# Patient Record
Sex: Female | Born: 1945 | Race: White | Hispanic: No | Marital: Single | Smoking: Never smoker
Health system: Southern US, Community
[De-identification: ages and names within clinical notes are randomized; demographics above are authoritative.]

## PROBLEM LIST (undated history)

## (undated) DIAGNOSIS — E860 Dehydration: Secondary | ICD-10-CM

## (undated) DIAGNOSIS — E119 Type 2 diabetes mellitus without complications: Secondary | ICD-10-CM

## (undated) DIAGNOSIS — R531 Weakness: Secondary | ICD-10-CM

## (undated) DIAGNOSIS — L270 Generalized skin eruption due to drugs and medicaments taken internally: Secondary | ICD-10-CM

## (undated) DIAGNOSIS — F319 Bipolar disorder, unspecified: Secondary | ICD-10-CM

## (undated) DIAGNOSIS — I469 Cardiac arrest, cause unspecified: Secondary | ICD-10-CM

## (undated) DIAGNOSIS — R0902 Hypoxemia: Secondary | ICD-10-CM

## (undated) DIAGNOSIS — J9621 Acute and chronic respiratory failure with hypoxia: Secondary | ICD-10-CM

## (undated) DIAGNOSIS — I10 Essential (primary) hypertension: Secondary | ICD-10-CM

## (undated) DIAGNOSIS — I4891 Unspecified atrial fibrillation: Secondary | ICD-10-CM

## (undated) HISTORY — DX: Acute and chronic respiratory failure with hypoxia: J96.21

## (undated) HISTORY — DX: Generalized skin eruption due to drugs and medicaments taken internally: L27.0

## (undated) HISTORY — DX: Hypoxemia: R09.02

## (undated) HISTORY — DX: Cardiac arrest, cause unspecified: I46.9

---
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
## 2018-02-27 DIAGNOSIS — E669 Obesity, unspecified: Secondary | ICD-10-CM | POA: Insufficient documentation

## 2018-02-27 DIAGNOSIS — Z6841 Body Mass Index (BMI) 40.0 and over, adult: Secondary | ICD-10-CM | POA: Insufficient documentation

## 2019-08-02 DIAGNOSIS — A0472 Enterocolitis due to Clostridium difficile, not specified as recurrent: Secondary | ICD-10-CM

## 2019-08-02 HISTORY — DX: Enterocolitis due to Clostridium difficile, not specified as recurrent: A04.72

## 2019-09-07 ENCOUNTER — Emergency Department: Payer: Medicare Other

## 2019-09-07 ENCOUNTER — Telehealth: Payer: Self-pay

## 2019-09-07 ENCOUNTER — Emergency Department
Admission: EM | Admit: 2019-09-07 | Discharge: 2019-09-07 | Disposition: A | Discharge status: Other Acute Inpatient Hospital | Payer: Medicare Other | Attending: Emergency Medicine | Admitting: Emergency Medicine

## 2019-09-07 ENCOUNTER — Inpatient Hospital Stay (HOSPITAL_COMMUNITY)
Admission: AD | Admit: 2019-09-07 | Discharge: 2019-09-23 | DRG: 870 | Disposition: A | Discharge status: Skilled Nursing Facility | Payer: Medicare Other | Source: Other Acute Inpatient Hospital | Attending: Internal Medicine | Admitting: Internal Medicine

## 2019-09-07 ENCOUNTER — Inpatient Hospital Stay (HOSPITAL_COMMUNITY): Payer: Medicare Other

## 2019-09-07 ENCOUNTER — Encounter: Payer: Self-pay | Admitting: Emergency Medicine

## 2019-09-07 DIAGNOSIS — J9691 Respiratory failure, unspecified with hypoxia: Secondary | ICD-10-CM

## 2019-09-07 DIAGNOSIS — A419 Sepsis, unspecified organism: Secondary | ICD-10-CM | POA: Diagnosis not present

## 2019-09-07 DIAGNOSIS — N390 Urinary tract infection, site not specified: Secondary | ICD-10-CM | POA: Diagnosis present

## 2019-09-07 DIAGNOSIS — R4182 Altered mental status, unspecified: Secondary | ICD-10-CM | POA: Insufficient documentation

## 2019-09-07 DIAGNOSIS — F319 Bipolar disorder, unspecified: Secondary | ICD-10-CM | POA: Diagnosis present

## 2019-09-07 DIAGNOSIS — N179 Acute kidney failure, unspecified: Secondary | ICD-10-CM | POA: Diagnosis not present

## 2019-09-07 DIAGNOSIS — D649 Anemia, unspecified: Secondary | ICD-10-CM | POA: Diagnosis present

## 2019-09-07 DIAGNOSIS — A4189 Other specified sepsis: Principal | ICD-10-CM | POA: Diagnosis present

## 2019-09-07 DIAGNOSIS — L89300 Pressure ulcer of unspecified buttock, unstageable: Secondary | ICD-10-CM | POA: Diagnosis not present

## 2019-09-07 DIAGNOSIS — L89150 Pressure ulcer of sacral region, unstageable: Secondary | ICD-10-CM | POA: Diagnosis present

## 2019-09-07 DIAGNOSIS — A499 Bacterial infection, unspecified: Secondary | ICD-10-CM

## 2019-09-07 DIAGNOSIS — L8990 Pressure ulcer of unspecified site, unspecified stage: Secondary | ICD-10-CM | POA: Insufficient documentation

## 2019-09-07 DIAGNOSIS — A4159 Other Gram-negative sepsis: Secondary | ICD-10-CM | POA: Diagnosis present

## 2019-09-07 DIAGNOSIS — L89892 Pressure ulcer of other site, stage 2: Secondary | ICD-10-CM | POA: Diagnosis present

## 2019-09-07 DIAGNOSIS — L89893 Pressure ulcer of other site, stage 3: Secondary | ICD-10-CM | POA: Diagnosis not present

## 2019-09-07 DIAGNOSIS — U071 COVID-19: Secondary | ICD-10-CM | POA: Insufficient documentation

## 2019-09-07 DIAGNOSIS — E119 Type 2 diabetes mellitus without complications: Secondary | ICD-10-CM | POA: Diagnosis not present

## 2019-09-07 DIAGNOSIS — E872 Acidosis: Secondary | ICD-10-CM | POA: Diagnosis not present

## 2019-09-07 DIAGNOSIS — Z79899 Other long term (current) drug therapy: Secondary | ICD-10-CM | POA: Diagnosis not present

## 2019-09-07 DIAGNOSIS — J9601 Acute respiratory failure with hypoxia: Secondary | ICD-10-CM

## 2019-09-07 DIAGNOSIS — E1165 Type 2 diabetes mellitus with hyperglycemia: Secondary | ICD-10-CM | POA: Diagnosis present

## 2019-09-07 DIAGNOSIS — J80 Acute respiratory distress syndrome: Secondary | ICD-10-CM | POA: Diagnosis not present

## 2019-09-07 DIAGNOSIS — E1151 Type 2 diabetes mellitus with diabetic peripheral angiopathy without gangrene: Secondary | ICD-10-CM | POA: Diagnosis not present

## 2019-09-07 DIAGNOSIS — J1520 Pneumonia due to staphylococcus, unspecified: Secondary | ICD-10-CM | POA: Diagnosis not present

## 2019-09-07 DIAGNOSIS — Z7901 Long term (current) use of anticoagulants: Secondary | ICD-10-CM

## 2019-09-07 DIAGNOSIS — G9341 Metabolic encephalopathy: Secondary | ICD-10-CM | POA: Diagnosis not present

## 2019-09-07 DIAGNOSIS — I10 Essential (primary) hypertension: Secondary | ICD-10-CM | POA: Insufficient documentation

## 2019-09-07 DIAGNOSIS — Z7984 Long term (current) use of oral hypoglycemic drugs: Secondary | ICD-10-CM | POA: Diagnosis not present

## 2019-09-07 DIAGNOSIS — R6520 Severe sepsis without septic shock: Secondary | ICD-10-CM | POA: Diagnosis not present

## 2019-09-07 DIAGNOSIS — T380X5A Adverse effect of glucocorticoids and synthetic analogues, initial encounter: Secondary | ICD-10-CM | POA: Diagnosis present

## 2019-09-07 DIAGNOSIS — J1282 Pneumonia due to coronavirus disease 2019: Secondary | ICD-10-CM | POA: Diagnosis not present

## 2019-09-07 DIAGNOSIS — Z9289 Personal history of other medical treatment: Secondary | ICD-10-CM | POA: Diagnosis not present

## 2019-09-07 DIAGNOSIS — K219 Gastro-esophageal reflux disease without esophagitis: Secondary | ICD-10-CM | POA: Diagnosis present

## 2019-09-07 DIAGNOSIS — I482 Chronic atrial fibrillation, unspecified: Secondary | ICD-10-CM | POA: Diagnosis not present

## 2019-09-07 DIAGNOSIS — R0600 Dyspnea, unspecified: Secondary | ICD-10-CM

## 2019-09-07 DIAGNOSIS — J9600 Acute respiratory failure, unspecified whether with hypoxia or hypercapnia: Secondary | ICD-10-CM

## 2019-09-07 HISTORY — DX: Dehydration: E86.0

## 2019-09-07 HISTORY — DX: Unspecified atrial fibrillation: I48.91

## 2019-09-07 HISTORY — DX: Essential (primary) hypertension: I10

## 2019-09-07 HISTORY — DX: Morbid (severe) obesity due to excess calories: E66.01

## 2019-09-07 HISTORY — DX: Weakness: R53.1

## 2019-09-07 HISTORY — DX: Type 2 diabetes mellitus without complications: E11.9

## 2019-09-07 LAB — CBC WITH DIFFERENTIAL/PLATELET
Abs Immature Granulocytes: 0.07 10*3/uL (ref 0.00–0.07)
Abs Immature Granulocytes: 0.09 K/uL — ABNORMAL HIGH (ref 0.00–0.07)
Basophils Absolute: 0 10*3/uL (ref 0.0–0.1)
Basophils Absolute: 0 K/uL (ref 0.0–0.1)
Basophils Relative: 0 %
Basophils Relative: 0 %
Eosinophils Absolute: 0.2 10*3/uL (ref 0.0–0.5)
Eosinophils Absolute: 0 K/uL (ref 0.0–0.5)
Eosinophils Relative: 3 %
Eosinophils Relative: 0 %
HCT: 45.3 % (ref 36.0–46.0)
HCT: 38.8 % (ref 36.0–46.0)
Hemoglobin: 13.8 g/dL (ref 12.0–15.0)
Hemoglobin: 12 g/dL (ref 12.0–15.0)
Immature Granulocytes: 1 %
Immature Granulocytes: 1 %
Lymphocytes Relative: 25 %
Lymphocytes Relative: 20 %
Lymphs Abs: 1.9 10*3/uL (ref 0.7–4.0)
Lymphs Abs: 1.6 K/uL (ref 0.7–4.0)
MCH: 30.1 pg (ref 26.0–34.0)
MCH: 30.7 pg (ref 26.0–34.0)
MCHC: 30.5 g/dL (ref 30.0–36.0)
MCHC: 30.9 g/dL (ref 30.0–36.0)
MCV: 98.9 fL (ref 80.0–100.0)
MCV: 99.2 fL (ref 80.0–100.0)
Monocytes Absolute: 1.1 10*3/uL — ABNORMAL HIGH (ref 0.1–1.0)
Monocytes Absolute: 0.6 K/uL (ref 0.1–1.0)
Monocytes Relative: 14 %
Monocytes Relative: 8 %
Neutro Abs: 4.5 10*3/uL (ref 1.7–7.7)
Neutro Abs: 5.5 K/uL (ref 1.7–7.7)
Neutrophils Relative %: 57 %
Neutrophils Relative %: 71 %
Platelets: 223 10*3/uL (ref 150–400)
Platelets: 176 K/uL (ref 150–400)
RBC: 4.58 MIL/uL (ref 3.87–5.11)
RBC: 3.91 MIL/uL (ref 3.87–5.11)
RDW: 14.5 % (ref 11.5–15.5)
RDW: 14.6 % (ref 11.5–15.5)
WBC: 7.7 10*3/uL (ref 4.0–10.5)
WBC: 7.7 K/uL (ref 4.0–10.5)
nRBC: 0 % (ref 0.0–0.2)
nRBC: 0 % (ref 0.0–0.2)

## 2019-09-07 LAB — POCT I-STAT 7, (LYTES, BLD GAS, ICA,H+H)
Acid-Base Excess: 9 mmol/L — ABNORMAL HIGH (ref 0.0–2.0)
Acid-Base Excess: 7 mmol/L — ABNORMAL HIGH (ref 0.0–2.0)
Bicarbonate: 32.8 mmol/L — ABNORMAL HIGH (ref 20.0–28.0)
Bicarbonate: 30.4 mmol/L — ABNORMAL HIGH (ref 20.0–28.0)
Calcium, Ion: 1.12 mmol/L — ABNORMAL LOW (ref 1.15–1.40)
Calcium, Ion: 1.14 mmol/L — ABNORMAL LOW (ref 1.15–1.40)
HCT: 36 % (ref 36.0–46.0)
HCT: 33 % — ABNORMAL LOW (ref 36.0–46.0)
Hemoglobin: 12.2 g/dL (ref 12.0–15.0)
Hemoglobin: 11.2 g/dL — ABNORMAL LOW (ref 12.0–15.0)
O2 Saturation: 97 %
O2 Saturation: 97 %
Patient temperature: 98.6
Potassium: 4.9 mmol/L (ref 3.5–5.1)
Potassium: 4.3 mmol/L (ref 3.5–5.1)
Sodium: 142 mmol/L (ref 135–145)
Sodium: 143 mmol/L (ref 135–145)
TCO2: 34 mmol/L — ABNORMAL HIGH (ref 22–32)
TCO2: 32 mmol/L (ref 22–32)
pCO2 arterial: 40.6 mmHg (ref 32.0–48.0)
pCO2 arterial: 37.7 mmHg (ref 32.0–48.0)
pH, Arterial: 7.515 — ABNORMAL HIGH (ref 7.350–7.450)
pH, Arterial: 7.515 — ABNORMAL HIGH (ref 7.350–7.450)
pO2, Arterial: 86 mmHg (ref 83.0–108.0)
pO2, Arterial: 77 mmHg — ABNORMAL LOW (ref 83.0–108.0)

## 2019-09-07 LAB — BLOOD GAS, ARTERIAL
Acid-Base Excess: 11.6 mmol/L — ABNORMAL HIGH (ref 0.0–2.0)
Bicarbonate: 35 mmol/L — ABNORMAL HIGH (ref 20.0–28.0)
FIO2: 0.6
MECHVT: 450 mL
O2 Saturation: 96.5 %
PEEP: 5 cmH2O
Patient temperature: 37
RATE: 16 resp/min
pCO2 arterial: 40 mmHg (ref 32.0–48.0)
pH, Arterial: 7.55 — ABNORMAL HIGH (ref 7.350–7.450)
pO2, Arterial: 74 mmHg — ABNORMAL LOW (ref 83.0–108.0)

## 2019-09-07 LAB — COMPREHENSIVE METABOLIC PANEL
ALT: 34 U/L (ref 0–44)
ALT: 29 U/L (ref 0–44)
AST: 39 U/L (ref 15–41)
AST: 40 U/L (ref 15–41)
Albumin: 2.6 g/dL — ABNORMAL LOW (ref 3.5–5.0)
Albumin: 2.2 g/dL — ABNORMAL LOW (ref 3.5–5.0)
Alkaline Phosphatase: 38 U/L (ref 38–126)
Alkaline Phosphatase: 31 U/L — ABNORMAL LOW (ref 38–126)
Anion gap: 11 (ref 5–15)
Anion gap: 15 (ref 5–15)
BUN: 58 mg/dL — ABNORMAL HIGH (ref 8–23)
BUN: 68 mg/dL — ABNORMAL HIGH (ref 8–23)
CO2: 35 mmol/L — ABNORMAL HIGH (ref 22–32)
CO2: 27 mmol/L (ref 22–32)
Calcium: 9 mg/dL (ref 8.9–10.3)
Calcium: 8.4 mg/dL — ABNORMAL LOW (ref 8.9–10.3)
Chloride: 99 mmol/L (ref 98–111)
Chloride: 101 mmol/L (ref 98–111)
Creatinine, Ser: 1.76 mg/dL — ABNORMAL HIGH (ref 0.44–1.00)
Creatinine, Ser: 1.87 mg/dL — ABNORMAL HIGH (ref 0.44–1.00)
GFR calc Af Amer: 33 mL/min — ABNORMAL LOW (ref >60)
GFR calc Af Amer: 31 mL/min — ABNORMAL LOW (ref >60)
GFR calc non Af Amer: 29 mL/min — ABNORMAL LOW (ref >60)
GFR calc non Af Amer: 27 mL/min — ABNORMAL LOW (ref >60)
Glucose, Bld: 306 mg/dL — ABNORMAL HIGH (ref 70–99)
Glucose, Bld: 395 mg/dL — ABNORMAL HIGH (ref 70–99)
Potassium: 5 mmol/L (ref 3.5–5.1)
Potassium: 4.8 mmol/L (ref 3.5–5.1)
Sodium: 145 mmol/L (ref 135–145)
Sodium: 143 mmol/L (ref 135–145)
Total Bilirubin: 0.8 mg/dL (ref 0.3–1.2)
Total Bilirubin: 1.1 mg/dL (ref 0.3–1.2)
Total Protein: 7.3 g/dL (ref 6.5–8.1)
Total Protein: 6 g/dL — ABNORMAL LOW (ref 6.5–8.1)

## 2019-09-07 LAB — RESPIRATORY PANEL BY RT PCR (FLU A&B, COVID)
Influenza A by PCR: NEGATIVE
Influenza B by PCR: NEGATIVE
SARS Coronavirus 2 by RT PCR: POSITIVE — AB

## 2019-09-07 LAB — FIBRIN DERIVATIVES D-DIMER (ARMC ONLY): Fibrin derivatives D-dimer (ARMC): 878.79 ng/mL (FEU) — ABNORMAL HIGH (ref 0.00–499.00)

## 2019-09-07 LAB — URINALYSIS, ROUTINE W REFLEX MICROSCOPIC
Bilirubin Urine: NEGATIVE
Glucose, UA: 50 mg/dL — AB
Ketones, ur: 5 mg/dL — AB
Leukocytes,Ua: NEGATIVE
Nitrite: NEGATIVE
Protein, ur: 100 mg/dL — AB
Specific Gravity, Urine: 1.024 (ref 1.005–1.030)
pH: 5 (ref 5.0–8.0)

## 2019-09-07 LAB — PHOSPHORUS: Phosphorus: 2.5 mg/dL (ref 2.5–4.6)

## 2019-09-07 LAB — BLOOD GAS, VENOUS
Acid-Base Excess: 11.4 mmol/L — ABNORMAL HIGH (ref 0.0–2.0)
Bicarbonate: 38.3 mmol/L — ABNORMAL HIGH (ref 20.0–28.0)
O2 Saturation: 92.2 %
Patient temperature: 37
pCO2, Ven: 59 mmHg (ref 44.0–60.0)
pH, Ven: 7.42 (ref 7.250–7.430)
pO2, Ven: 63 mmHg — ABNORMAL HIGH (ref 32.0–45.0)

## 2019-09-07 LAB — GLUCOSE, CAPILLARY
Glucose-Capillary: 375 mg/dL — ABNORMAL HIGH (ref 70–99)
Glucose-Capillary: 415 mg/dL — ABNORMAL HIGH (ref 70–99)
Glucose-Capillary: 400 mg/dL — ABNORMAL HIGH (ref 70–99)
Glucose-Capillary: 351 mg/dL — ABNORMAL HIGH (ref 70–99)

## 2019-09-07 LAB — PROCALCITONIN
Procalcitonin: 0.16 ng/mL
Procalcitonin: 0.16 ng/mL

## 2019-09-07 LAB — LACTIC ACID, PLASMA
Lactic Acid, Venous: 1.6 mmol/L (ref 0.5–1.9)
Lactic Acid, Venous: 1.8 mmol/L (ref 0.5–1.9)
Lactic Acid, Venous: 2 mmol/L (ref 0.5–1.9)

## 2019-09-07 LAB — TRIGLYCERIDES: Triglycerides: 300 mg/dL — ABNORMAL HIGH (ref <150)

## 2019-09-07 LAB — C-REACTIVE PROTEIN: CRP: 9.1 mg/dL — ABNORMAL HIGH (ref <1.0)

## 2019-09-07 LAB — PROTIME-INR
INR: 1.4 — ABNORMAL HIGH (ref 0.8–1.2)
INR: 1.6 — ABNORMAL HIGH (ref 0.8–1.2)
Prothrombin Time: 17.3 seconds — ABNORMAL HIGH (ref 11.4–15.2)
Prothrombin Time: 18.5 s — ABNORMAL HIGH (ref 11.4–15.2)

## 2019-09-07 LAB — APTT: aPTT: 33 s (ref 24–36)

## 2019-09-07 LAB — MAGNESIUM: Magnesium: 2.2 mg/dL (ref 1.7–2.4)

## 2019-09-07 LAB — BRAIN NATRIURETIC PEPTIDE: B Natriuretic Peptide: 37.6 pg/mL (ref 0.0–100.0)

## 2019-09-07 LAB — VALPROIC ACID LEVEL: Valproic Acid Lvl: 19 ug/mL — ABNORMAL LOW (ref 50.0–100.0)

## 2019-09-07 LAB — D-DIMER, QUANTITATIVE: D-Dimer, Quant: 0.59 ug{FEU}/mL — ABNORMAL HIGH (ref 0.00–0.50)

## 2019-09-07 IMAGING — DX DG CHEST 1V PORT
1 series · 1 of 1 positions shown · non-contrast
Comparison: Earlier radiographs today.

CLINICAL DATA: Post intubation.

EXAM:
PORTABLE CHEST 1 VIEW

[chest ap]
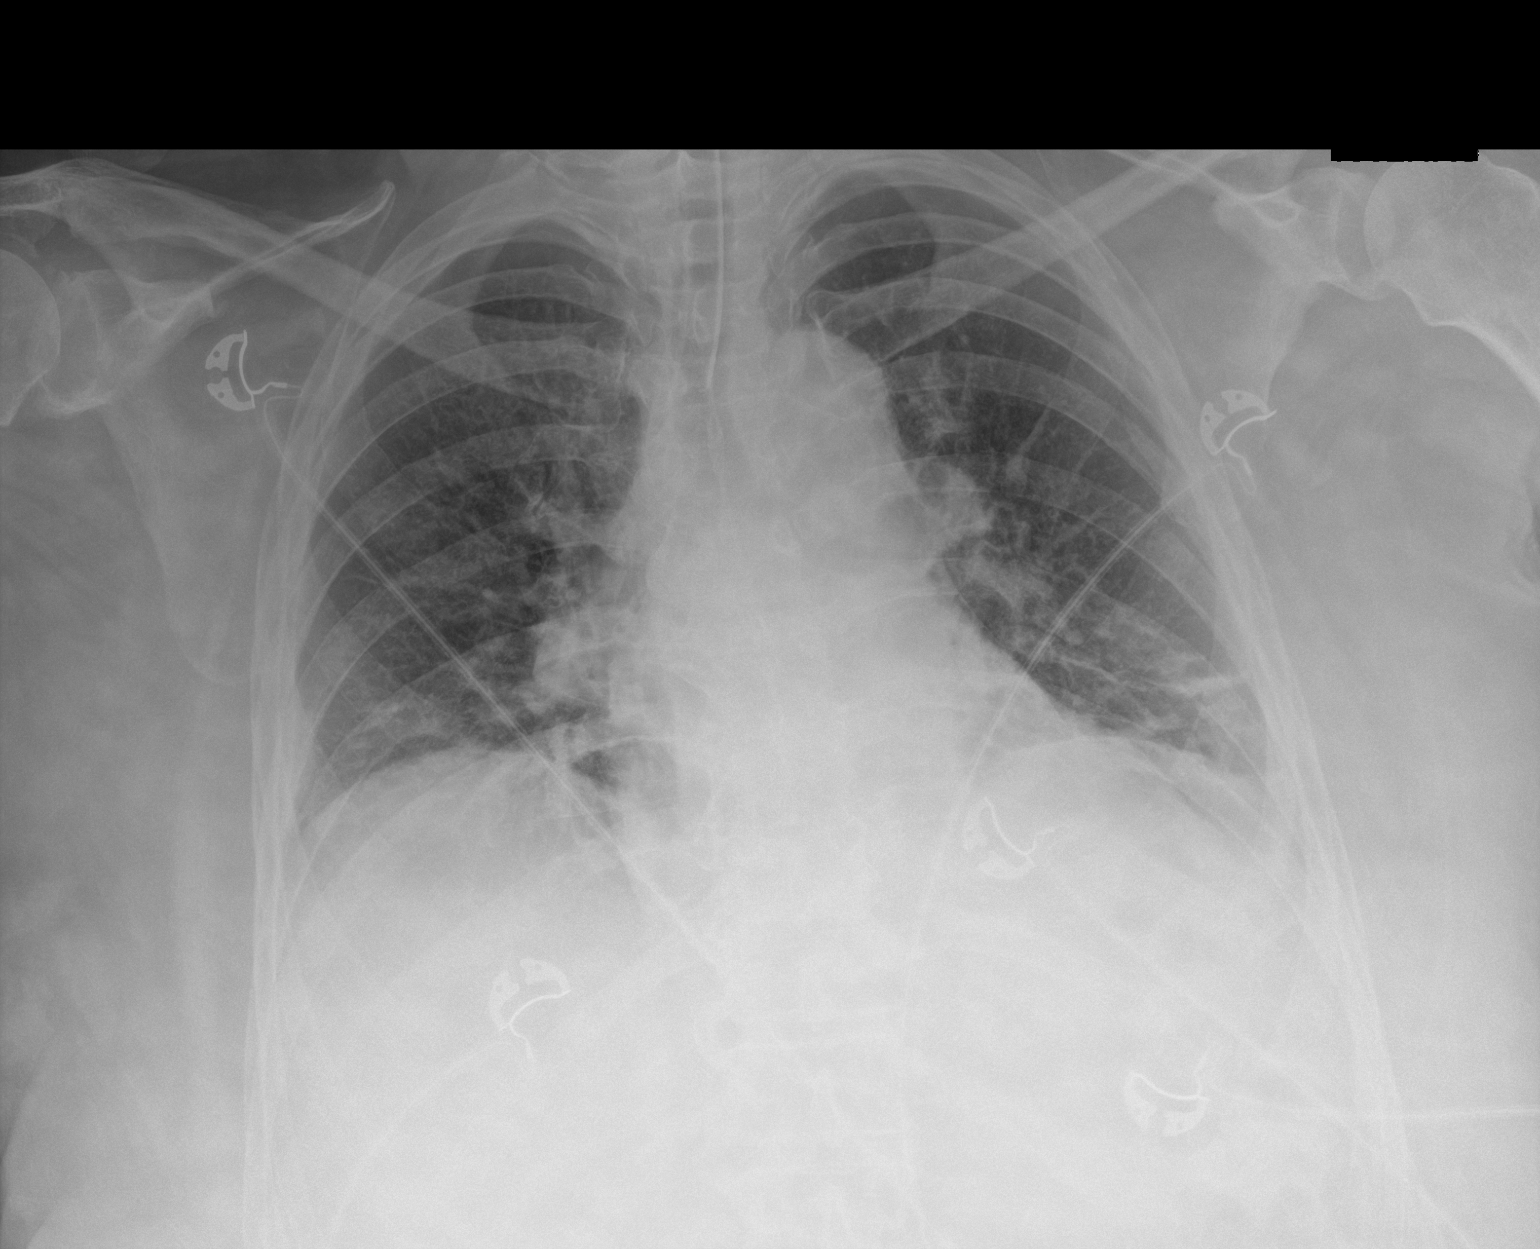

[1 of 1 positions shown; findings below may reference images not displayed]

FINDINGS: [78] hours. Interval intubation. Tip of the endotracheal tube is in
the mid trachea, 3.2 cm above the carina. There are lower lung
volumes with increased patchy opacities at both lung bases which may
reflect atelectasis. There is no confluent airspace opacity, edema,
pleural effusion or pneumothorax. The heart size and mediastinal
contours are stable.
IMPRESSION: Satisfactory position of the endotracheal tube. Lower lung volumes
with probable increased bibasilar atelectasis. Cannot exclude early
aspiration.

## 2019-09-07 IMAGING — DX DG CHEST 1V PORT
1 series · 1 of 1 positions shown · non-contrast
Comparison: None.

CLINICAL DATA: Sepsis.  [DZ] positive.

EXAM:
PORTABLE CHEST 1 VIEW

[chest ap]
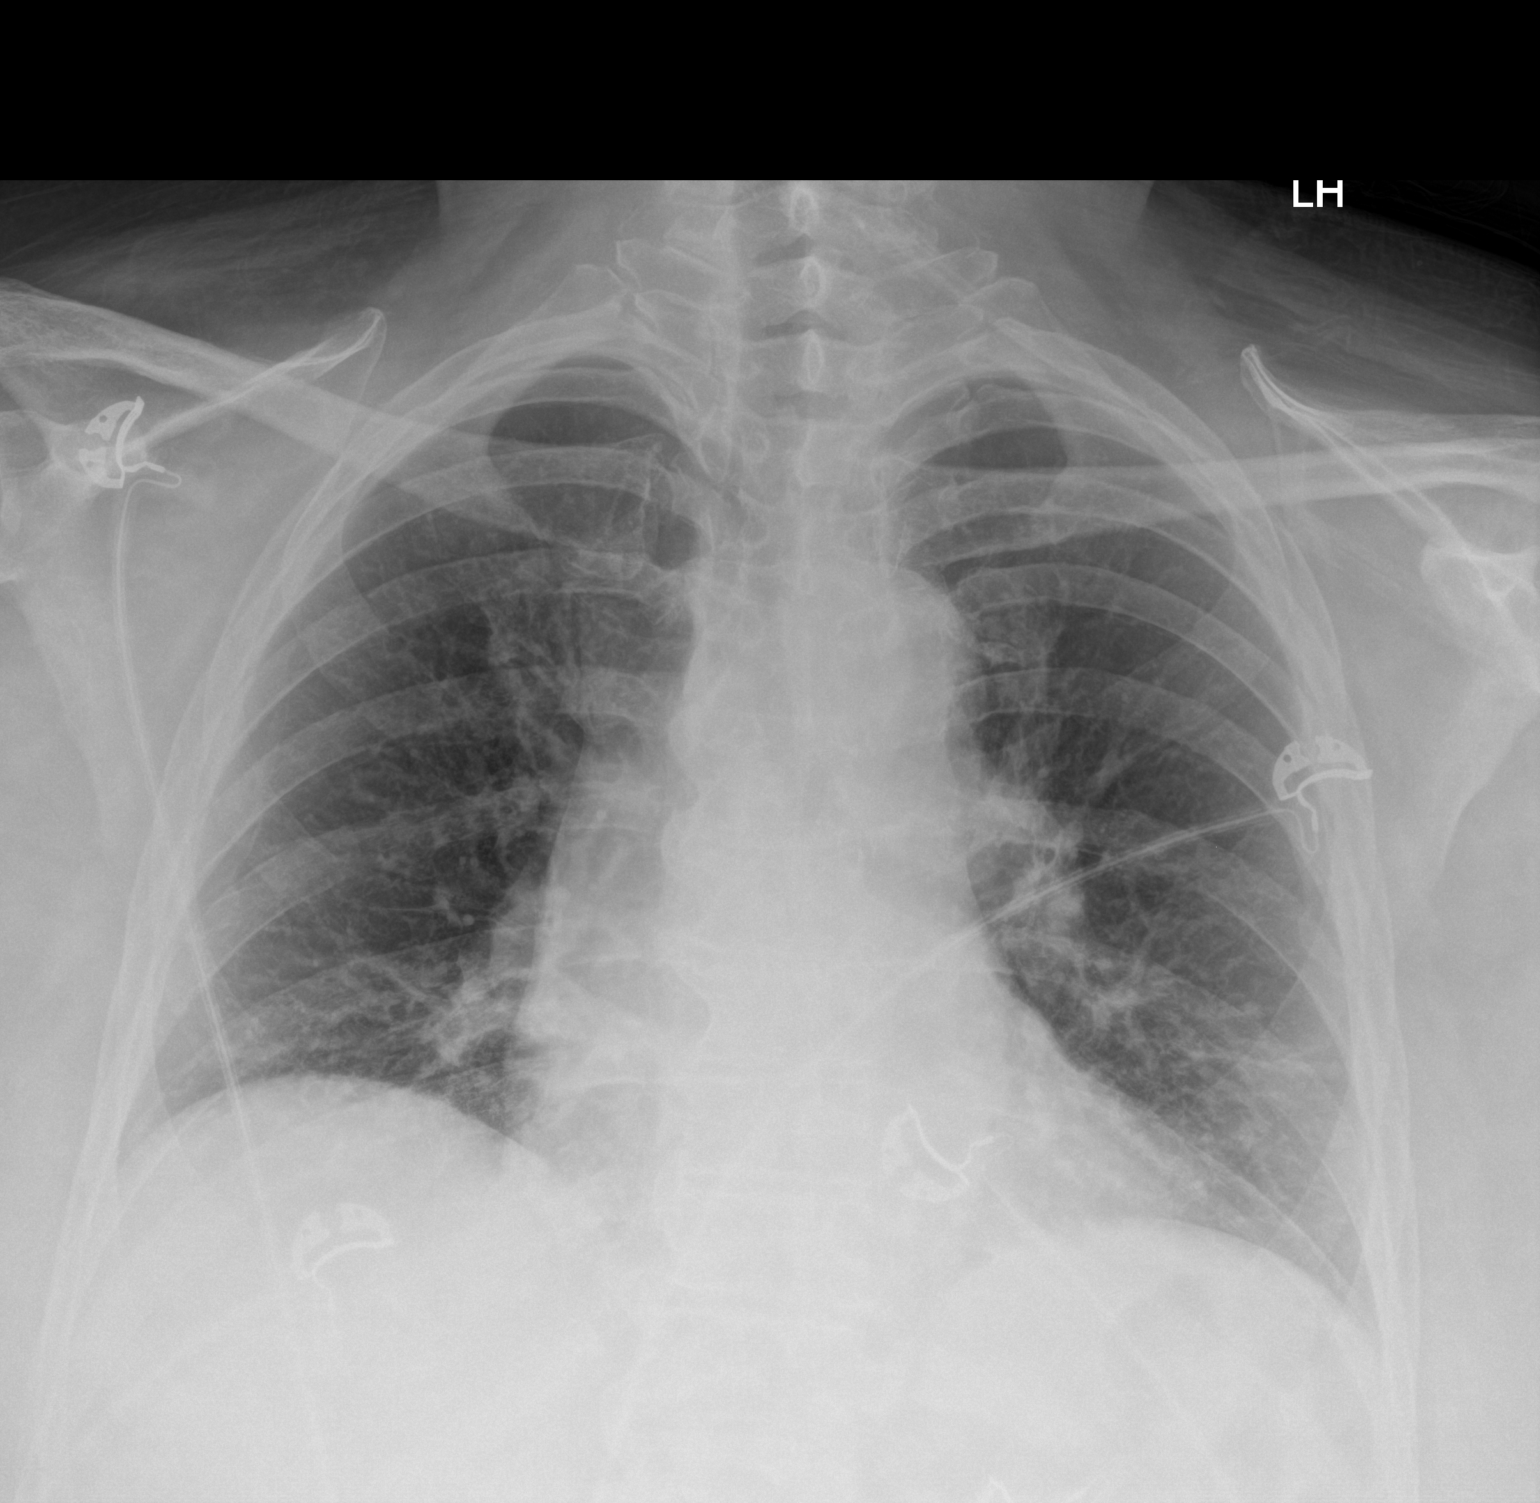

[1 of 1 positions shown; findings below may reference images not displayed]

FINDINGS: Midline trachea. Borderline cardiomegaly. No pleural effusion or
pneumothorax. Interstitial prominence is lower lobe predominant,
accentuated by low lung volumes and AP portable
technique-nonspecific. No lobar consolidation.
IMPRESSION: Mildly low lung volumes, without acute disease.

## 2019-09-07 IMAGING — CT CT HEAD W/O CM
4 series · 16 of 47 positions shown, 18 images · non-contrast
Comparison: None.

CLINICAL DATA: Altered mental status with tachypnea and fever.
[LD] positive

EXAM:
CT HEAD WITHOUT CONTRAST
TECHNIQUE: Contiguous axial images were obtained from the base of the skull
through the vertex without intravenous contrast.

[Series 2: head wo · axial · 0.39mm/px · z∈[-126,-16]mm · 7 of 30 slices shown, 9 images]
[im 4/30  brain]
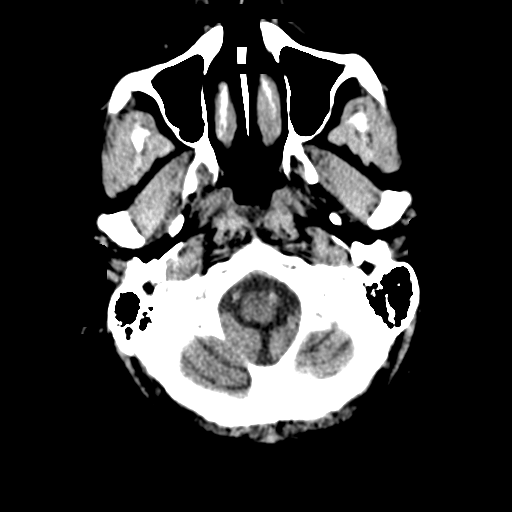
[im 4/30  bone]
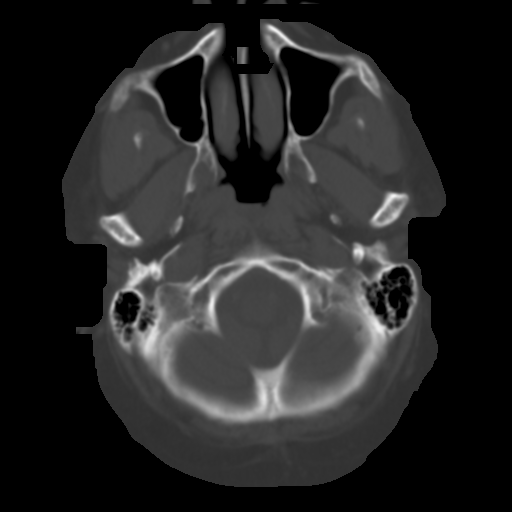
[im 8/30  brain]
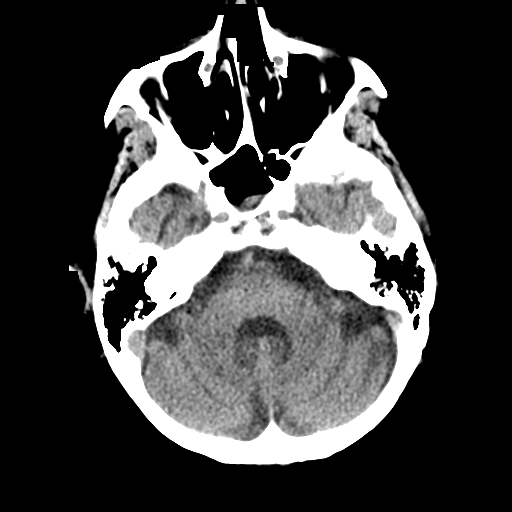
[im 11/30  brain]
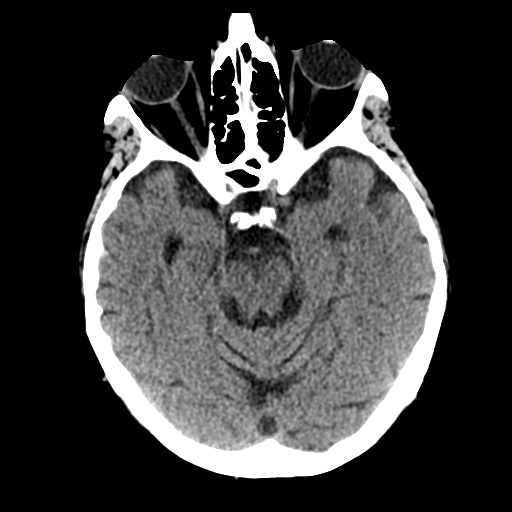
[im 15/30  brain]
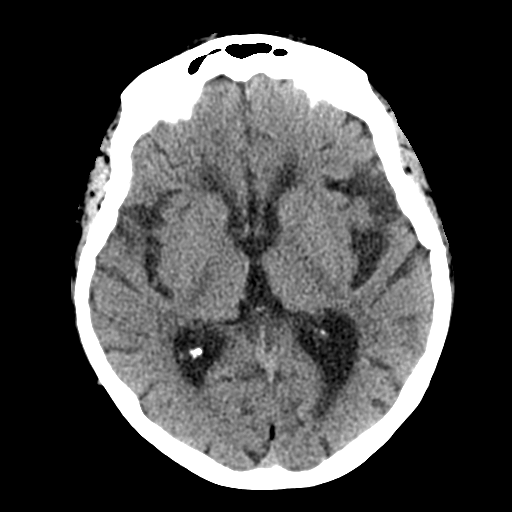
[im 19/30  brain]
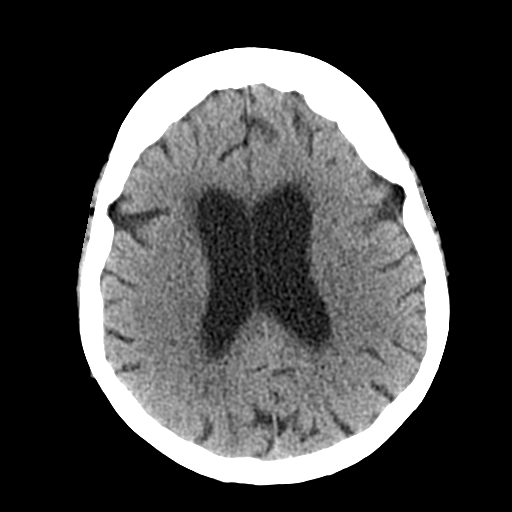
[im 19/30  bone]
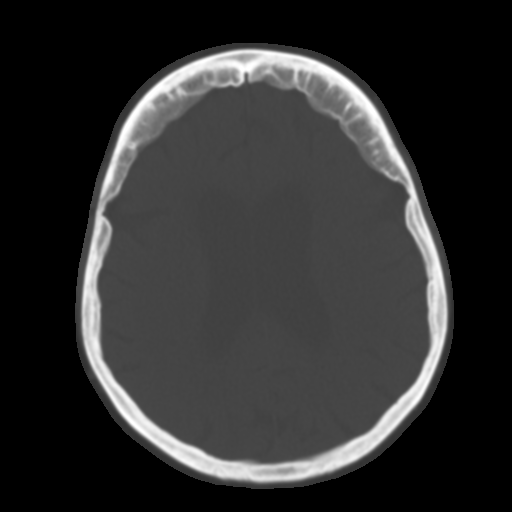
[im 22/30  brain]
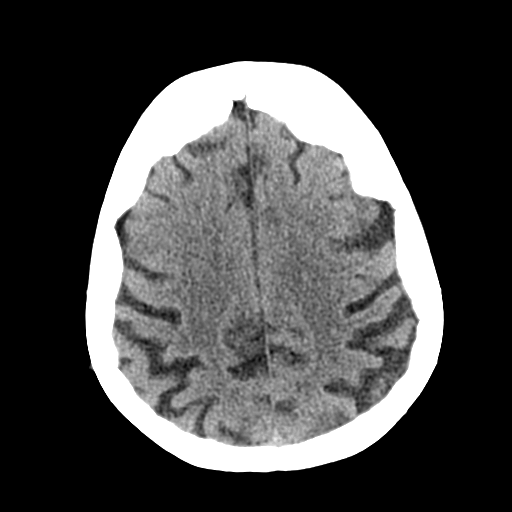
[im 26/30  brain]
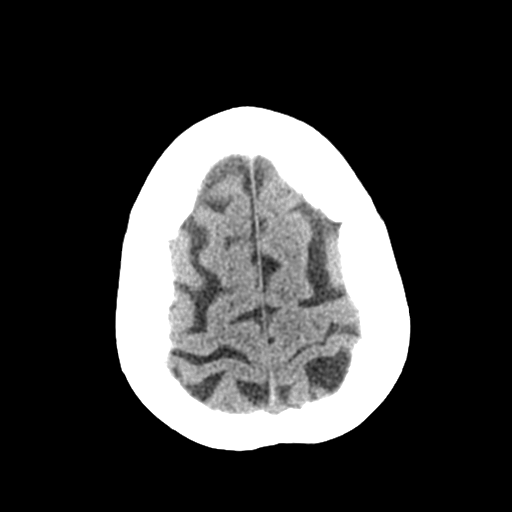

[Series 3: head bone · axial · 0.39mm/px · z∈[-127,-97]mm · 3 of 75 slices shown]
[im 8/75  bone]
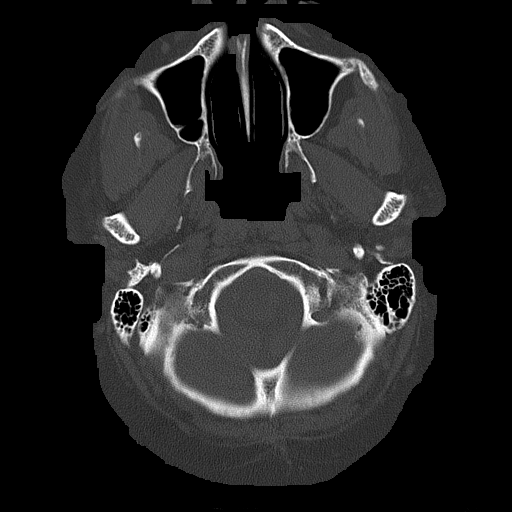
[im 15/75  bone]
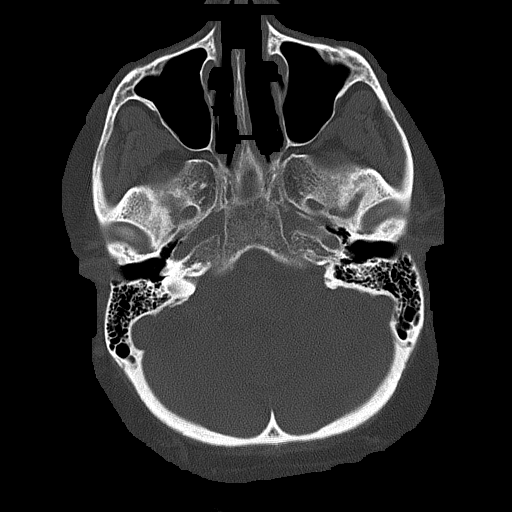
[im 23/75  bone]
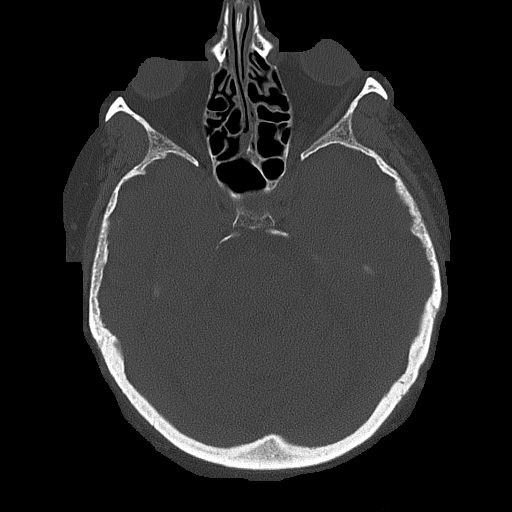

[Series 4: coronal soft tissue · coronal · 0.31mm/px · 3 of 66 slices shown]
[im 22/66  brain]
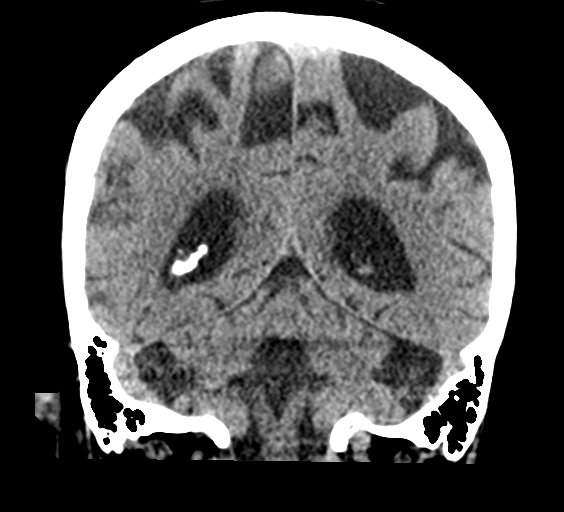
[im 29/66  brain]
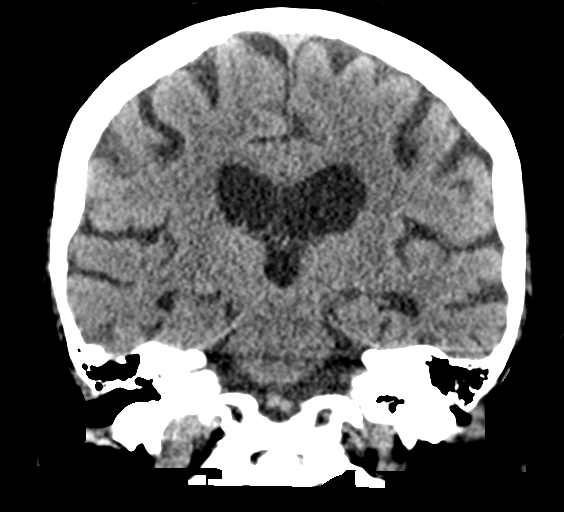
[im 37/66  brain]
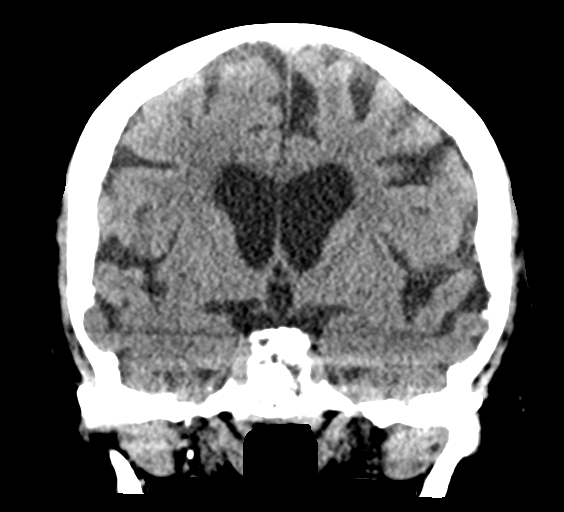

[Series 5: sagittal soft tissue · sagittal · 0.31mm/px · 3 of 59 slices shown]
[im 20/59  brain]
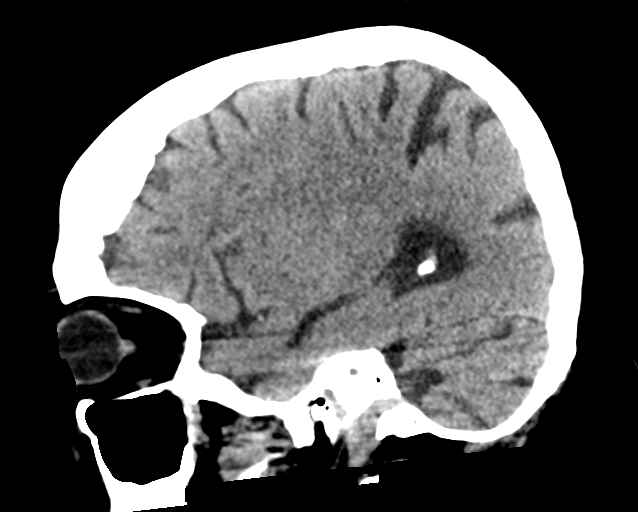
[im 30/59  brain]
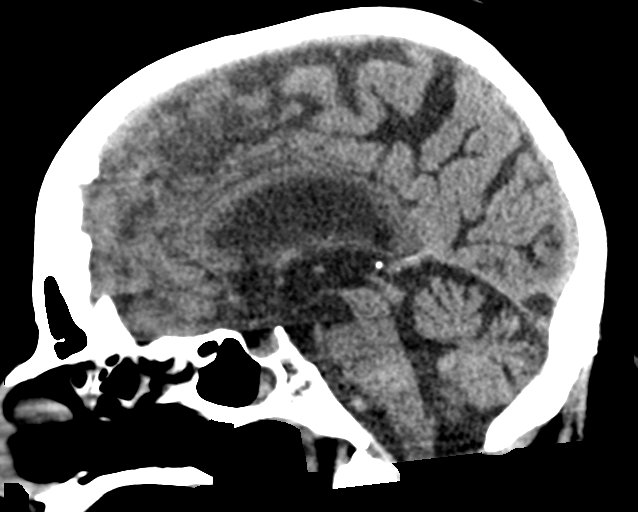
[im 39/59  brain]
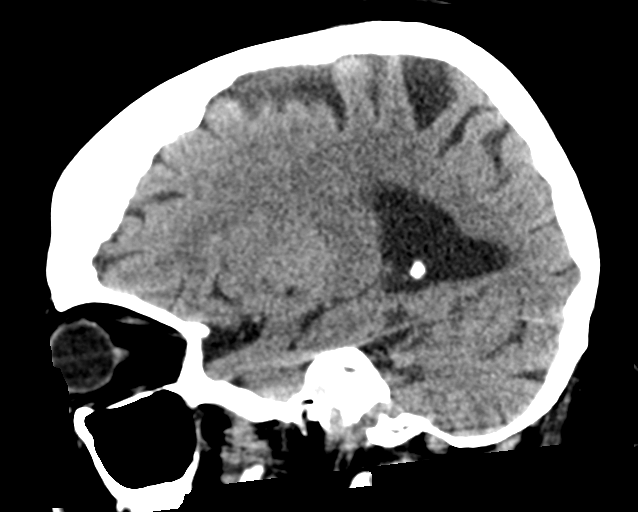

[16 of 47 positions shown; findings below may reference images not displayed]

FINDINGS: Brain: There is mild diffuse atrophy. There is no intracranial mass,
hemorrhage, extra-axial fluid collection, or midline shift. There is
mild small vessel disease in the centra semiovale bilaterally. No
acute infarct is evident on this study.

Vascular: There is no hyperdense vessel. No appreciable vascular
calcification.

Skull: The bony calvarium appears intact.

Sinuses/Orbits: There is mucosal thickening and opacification in the
posterior sphenoid sinus region. There is mucosal thickening
involving multiple ethmoid air cells. Orbits appear symmetric
bilaterally.

Other: Mastoid air cells are clear.
IMPRESSION: Atrophy with mild periventricular small vessel disease. No evident
acute infarct. No mass or hemorrhage.

There are foci of paranasal sinus disease at several sites.

## 2019-09-07 IMAGING — DX DG CHEST 1V
1 series · 1 of 1 positions shown · non-contrast
Comparison: Portable chest [KQ] hours today.

CLINICAL DATA: 73-year-old female [KQ]. Endotracheal tube and
enteric tube placement.

EXAM:
CHEST  1 VIEW

[chest ap]
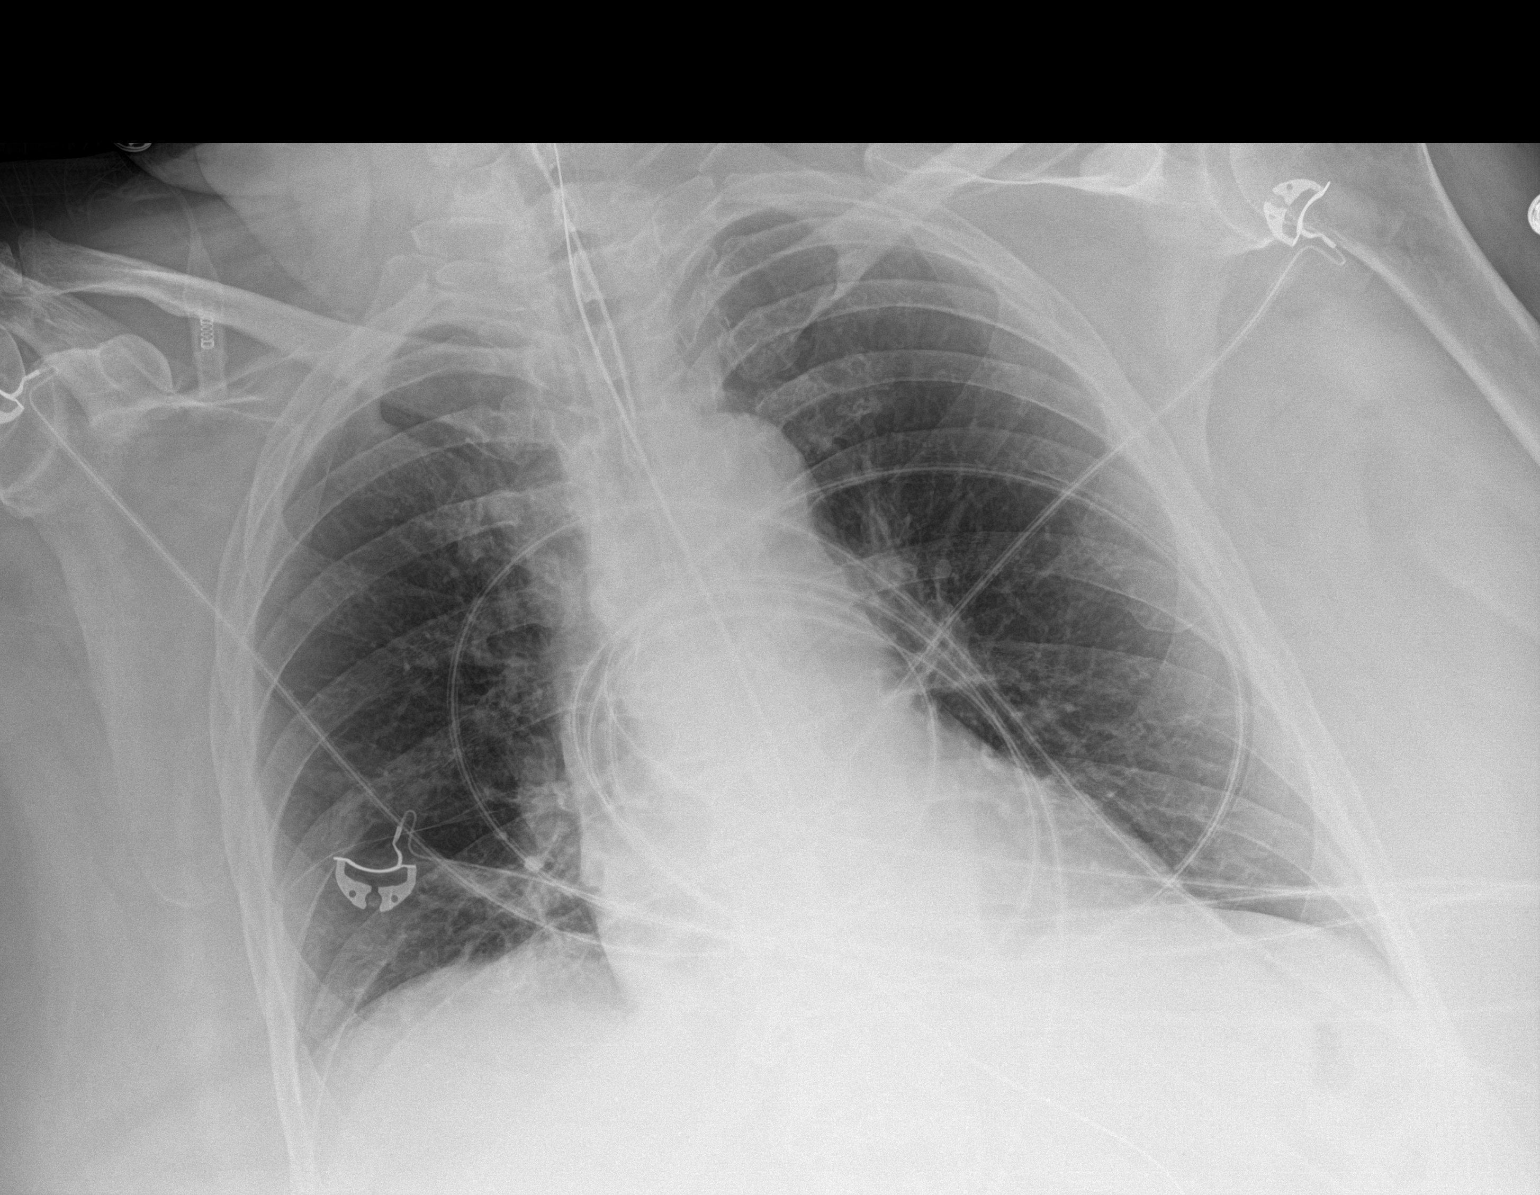

[1 of 1 positions shown; findings below may reference images not displayed]

FINDINGS: Portable AP semi upright view at [KQ] hours. Endotracheal tube tip
in good position between the level the clavicles and carina. Enteric
tube courses to the left abdomen, tip not included.

Larger lung volumes. Mediastinal contours remain normal. Allowing
for portable technique the lungs are clear. No pneumothorax or
pleural effusion identified.
IMPRESSION: 1. Endotracheal tube and enteric tube appear appropriately placed.
2. Larger lung volumes with clear lungs now when allowing for
portable technique.

## 2019-09-07 MED ORDER — ACETAMINOPHEN 650 MG RE SUPP
650.0000 mg | Freq: Once | RECTAL | Status: AC
  Administered 2019-09-07: 650 mg via RECTAL
  Filled 2019-09-07: qty 1

## 2019-09-07 MED ORDER — INSULIN ASPART 100 UNIT/ML ~~LOC~~ SOLN
0.0000 [IU] | SUBCUTANEOUS | Status: DC
  Administered 2019-09-07: 20 [IU] via SUBCUTANEOUS

## 2019-09-07 MED ORDER — INSULIN REGULAR(HUMAN) IN NACL 100-0.9 UT/100ML-% IV SOLN
INTRAVENOUS | Status: DC
  Administered 2019-09-07: 14 [IU]/h via INTRAVENOUS
  Filled 2019-09-07 (×2): qty 100

## 2019-09-07 MED ORDER — APIXABAN 5 MG PO TABS
5.0000 mg | ORAL_TABLET | Freq: Two times a day (BID) | ORAL | Status: DC
  Administered 2019-09-07 – 2019-09-14 (×14): 5 mg
  Filled 2019-09-07 (×14): qty 1

## 2019-09-07 MED ORDER — FENTANYL 2500MCG IN NS 250ML (10MCG/ML) PREMIX INFUSION
25.0000 ug/h | INTRAVENOUS | Status: DC
  Administered 2019-09-07: 25 ug/h via INTRAVENOUS
  Administered 2019-09-07: 75 ug/h via INTRAVENOUS
  Administered 2019-09-08 – 2019-09-09 (×2): 150 ug/h via INTRAVENOUS
  Administered 2019-09-09: 21:00:00 200 ug/h via INTRAVENOUS
  Filled 2019-09-07 (×3): qty 250

## 2019-09-07 MED ORDER — PROPOFOL 1000 MG/100ML IV EMUL
0.0000 ug/kg/min | INTRAVENOUS | Status: DC
  Filled 2019-09-07: qty 100

## 2019-09-07 MED ORDER — SODIUM CHLORIDE 0.9 % IV SOLN
200.0000 mg | Freq: Once | INTRAVENOUS | Status: DC
  Filled 2019-09-07: qty 40

## 2019-09-07 MED ORDER — CHLORHEXIDINE GLUCONATE 0.12% ORAL RINSE (MEDLINE KIT)
15.0000 mL | Freq: Two times a day (BID) | OROMUCOSAL | Status: DC
  Administered 2019-09-08 – 2019-09-14 (×14): 15 mL via OROMUCOSAL

## 2019-09-07 MED ORDER — MIDAZOLAM HCL 2 MG/2ML IJ SOLN: 1.0000 mg | INTRAMUSCULAR | Status: DC | PRN

## 2019-09-07 MED ORDER — SODIUM CHLORIDE 0.9 % IV BOLUS
500.0000 mL | Freq: Once | INTRAVENOUS | Status: AC
  Administered 2019-09-07: 19:00:00 500 mL via INTRAVENOUS

## 2019-09-07 MED ORDER — SODIUM CHLORIDE 0.9 % IV BOLUS
1000.0000 mL | Freq: Once | INTRAVENOUS | Status: AC
  Administered 2019-09-07: 1000 mL via INTRAVENOUS

## 2019-09-07 MED ORDER — DEXTROSE 50 % IV SOLN: 0.0000 mL | INTRAVENOUS | Status: DC | PRN

## 2019-09-07 MED ORDER — SODIUM CHLORIDE 0.9 % IV SOLN
2.0000 g | Freq: Once | INTRAVENOUS | Status: AC
  Administered 2019-09-07: 2 g via INTRAVENOUS
  Filled 2019-09-07: qty 2

## 2019-09-07 MED ORDER — SUCCINYLCHOLINE CHLORIDE 20 MG/ML IJ SOLN
150.0000 mg | Freq: Once | INTRAMUSCULAR | Status: AC
  Administered 2019-09-07: 150 mg via INTRAVENOUS
  Filled 2019-09-07: qty 1

## 2019-09-07 MED ORDER — ETOMIDATE 2 MG/ML IV SOLN
20.0000 mg | Freq: Once | INTRAVENOUS | Status: AC
  Administered 2019-09-07: 20 mg via INTRAVENOUS
  Filled 2019-09-07: qty 10

## 2019-09-07 MED ORDER — INSULIN ASPART 100 UNIT/ML ~~LOC~~ SOLN: 2.0000 [IU] | SUBCUTANEOUS | Status: DC

## 2019-09-07 MED ORDER — FENTANYL BOLUS VIA INFUSION
25.0000 ug | INTRAVENOUS | Status: DC | PRN
  Filled 2019-09-07: qty 25

## 2019-09-07 MED ORDER — VANCOMYCIN HCL 2000 MG/400ML IV SOLN
2000.0000 mg | Freq: Once | INTRAVENOUS | Status: DC
  Filled 2019-09-07: qty 400

## 2019-09-07 MED ORDER — PROPOFOL 1000 MG/100ML IV EMUL: 0.0000 ug/kg/min | INTRAVENOUS | Status: DC

## 2019-09-07 MED ORDER — SODIUM CHLORIDE 0.9 % IV SOLN
100.0000 mg | Freq: Every day | INTRAVENOUS | Status: DC
  Filled 2019-09-07: qty 20

## 2019-09-07 MED ORDER — SODIUM CHLORIDE 0.9 % IV SOLN: INTRAVENOUS | Status: DC

## 2019-09-07 MED ORDER — FAMOTIDINE 40 MG/5ML PO SUSR
20.0000 mg | Freq: Every day | ORAL | Status: DC
  Administered 2019-09-07 – 2019-09-14 (×8): 20 mg
  Filled 2019-09-07 (×8): qty 2.5

## 2019-09-07 MED ORDER — PROPOFOL 1000 MG/100ML IV EMUL
INTRAVENOUS | Status: AC
  Administered 2019-09-07: 20 ug/kg/min via INTRAVENOUS
  Filled 2019-09-07: qty 100

## 2019-09-07 MED ORDER — DEXAMETHASONE SODIUM PHOSPHATE 10 MG/ML IJ SOLN
10.0000 mg | Freq: Once | INTRAMUSCULAR | Status: AC
  Administered 2019-09-07: 10 mg via INTRAVENOUS
  Filled 2019-09-07: qty 1

## 2019-09-07 MED ORDER — ACETAMINOPHEN 160 MG/5ML PO SOLN
650.0000 mg | ORAL | Status: DC | PRN
  Administered 2019-09-07 – 2019-09-13 (×12): 650 mg
  Filled 2019-09-07 (×12): qty 20.3

## 2019-09-07 MED ORDER — CHLORHEXIDINE GLUCONATE CLOTH 2 % EX PADS
6.0000 | MEDICATED_PAD | Freq: Every day | CUTANEOUS | Status: DC
  Administered 2019-09-08 – 2019-09-22 (×13): 6 via TOPICAL

## 2019-09-07 MED ORDER — DEXTROSE-NACL 5-0.45 % IV SOLN: INTRAVENOUS | Status: DC

## 2019-09-07 MED ORDER — FENTANYL CITRATE (PF) 100 MCG/2ML IJ SOLN
50.0000 ug | Freq: Once | INTRAMUSCULAR | Status: AC
  Administered 2019-09-07: 50 ug via INTRAVENOUS
  Filled 2019-09-07: qty 2

## 2019-09-07 MED ORDER — MIDAZOLAM 50MG/50ML (1MG/ML) PREMIX INFUSION
0.5000 mg/h | INTRAVENOUS | Status: AC
  Administered 2019-09-07: 19:00:00 1 mg/h via INTRAVENOUS
  Administered 2019-09-07 – 2019-09-09 (×4): 4 mg/h via INTRAVENOUS
  Filled 2019-09-07 (×4): qty 50

## 2019-09-07 MED ORDER — LACTATED RINGERS IV BOLUS (SEPSIS)
1000.0000 mL | Freq: Once | INTRAVENOUS | Status: AC
  Administered 2019-09-07: 1000 mL via INTRAVENOUS

## 2019-09-07 MED ORDER — FENTANYL CITRATE (PF) 100 MCG/2ML IJ SOLN: 25.0000 ug | Freq: Once | INTRAMUSCULAR | Status: DC

## 2019-09-07 MED ORDER — ORAL CARE MOUTH RINSE
15.0000 mL | OROMUCOSAL | Status: DC
  Administered 2019-09-08 – 2019-09-15 (×74): 15 mL via OROMUCOSAL

## 2019-09-07 NOTE — ED Notes (Signed)
Report given to [facility] 

## 2019-09-07 NOTE — Progress Notes (Signed)
eLink Physician-Brief Progress Note Patient Name: Becky Hendricks DOB: [birth year] MRN: [id number]   Date of Service  09/07/2019  HPI/Events of Note  Temp 101.9, Soft blood pressure, hyperglycemia.  eICU Interventions  Tylenol 650 mg via OGT Q 4 hours PRN fever, Insulin phase 2 orders entered, 250 ml crystalloid fluid bolus ordered.        [name] 09/07/2019, 10:01 PM

## 2019-09-07 NOTE — Progress Notes (Signed)
Pt arrived on the unit. VS stable at the beginning. Foley present. OG placed On propofol. MD at the bedside. Shortly after, BP started to decrease. MD notified. Bolus given.  2 rings at the bedside. No other belongings present. Brother [name] called and updated. He said, in December she was found face down (2 days). She went to rehab, where they should help with her Rt arm weakness.  Skin assessment revealed old bruising, as well as new. Large deep tissue injury present on the sacrum/bottoclks. Pt does not follow commands. But moving Lt arm  and head.

## 2019-09-07 NOTE — ED Provider Notes (Signed)
[facility] Emergency Department Provider Note  ____________________________________________   First MD Initiated Contact with Patient 09/07/19 1023     (approximate)  I have reviewed the triage vital signs and the nursing notes.   HISTORY  Chief Complaint Altered Mental Status (covid +)    HPI Becky SersJanina Daus is a 74 y.o. female with past medical history of bipolar disorder, atrial fibrillation, GERD obesity, diabetes, here with altered mental status.  Per review of records, the patient was just recently admitted at [facility] in December.  She was admitted for altered mental status as well as C. difficile colitis.  Per discussion with her brother, she had a history of psychiatric disease, but was otherwise living independently prior to this.  She was discharged to a nursing home and brother states she has progressively declined since then.  Per report, she was diagnosed with Covid on 1/17.  She has been progressively worsening since then.  Of last 24 hours, she has acutely worsened and is now more confused, altered. She has had increased WOB.       Past Medical History:  Diagnosis Date  . Atrial fibrillation (HCC)   . Bipolar disorder (HCC)   . Clostridium difficile diarrhea 08/02/2019  . Dehydration   . Essential hypertension   . Morbid obesity (HCC)   . Type 2 diabetes mellitus (HCC)   . Weakness     There are no problems to display for this patient.     Prior to Admission medications   Medication Sig Start Date End Date Taking? Authorizing Provider  amLODipine (NORVASC) 5 MG tablet Take 5 mg by mouth daily. 08/11/19 08/10/20 Yes [provider]  apixaban (ELIQUIS) 5 MG TABS tablet Take 5 mg by mouth daily. 08/11/19 08/05/20 Yes [provider]  ascorbic acid (VITAMIN C) 1000 MG tablet Take 500 mg by mouth 2 (two) times daily. 09/08/12  Yes [provider]  Calcium Carbonate-Vitamin D 600-200 MG-UNIT TABS Take 1 tablet by  mouth daily. 09/08/12  Yes [provider]  divalproex (DEPAKOTE) 500 MG DR tablet Take 500 mg by mouth daily. 06/13/12  Yes [provider]  liraglutide (VICTOZA) 18 MG/3ML SOPN Inject 1.8 mg into the skin daily. 07/23/19  Yes [provider]  lisinopril (ZESTRIL) 20 MG tablet Take 20 mg by mouth daily. 08/11/19 08/05/20 Yes [provider]  Melatonin 3 MG TABS Take 3 mg by mouth at bedtime. 08/11/19 08/05/20 Yes [provider]  metFORMIN (GLUCOPHAGE-XR) 500 MG 24 hr tablet Take 1,000 mg by mouth 2 (two) times daily. 10/22/17  Yes [provider]  metoprolol tartrate (LOPRESSOR) 25 MG tablet Take 25 mg by mouth 2 (two) times daily. 08/11/19 09/10/19 Yes [provider]  Multiple Vitamin (MULTIVITAMIN WITH MINERALS) TABS tablet Take 1 tablet by mouth daily.   Yes [provider]  Omega-3 Fatty Acids (FISH OIL) 1200 MG CAPS Take 1,200 mg by mouth daily.   Yes [provider]  QUEtiapine (SEROQUEL) 100 MG tablet Take 2.5 tablets by mouth at bedtime. 06/13/12  Yes [provider]    Allergies Patient has no allergy information on record.  No family history on file.  Social History Social History   Tobacco Use  . Smoking status: Not on file  Substance Use Topics  . Alcohol use: Not on file  . Drug use: Not on file    Review of Systems  Review of Systems  Unable to perform ROS: Mental status change  ____________________________________________  PHYSICAL EXAM:      VITAL SIGNS: ED Triage Vitals  Enc Vitals Group     BP 09/07/19 1000 127/77     Pulse Rate 09/07/19 1000 (!) 120     Resp 09/07/19 1000 (!) 26     Temp 09/07/19 1004 (!) 101.7 F (38.7 C)     Temp Source 09/07/19 1004 Axillary     SpO2 09/07/19 1000 (!) 88 %     Weight --      Height --      Head Circumference --      Peak Flow --      Pain Score --      Pain Loc --      Pain Edu? --      Excl. in [place]? --      Physical  Exam Vitals and nursing note reviewed.  Constitutional:      General: She is not in acute distress.    Appearance: She is well-developed. She is ill-appearing and toxic-appearing.  HENT:     Head: Normocephalic and atraumatic.     Mouth/Throat:     Mouth: Mucous membranes are dry.     Comments: Pooling secretions in oral cavity Eyes:     Conjunctiva/sclera: Conjunctivae normal.  Cardiovascular:     Rate and Rhythm: Regular rhythm. Tachycardia present.     Heart sounds: Normal heart sounds.  Pulmonary:     Effort: Pulmonary effort is normal. Tachypnea present. No respiratory distress.     Breath sounds: Examination of the right-upper field reveals rhonchi. Examination of the left-upper field reveals rhonchi. Examination of the right-middle field reveals rhonchi. Examination of the left-middle field reveals rhonchi. Examination of the right-lower field reveals rhonchi. Examination of the left-lower field reveals rhonchi. Decreased breath sounds and rhonchi present. No wheezing.  Abdominal:     General: There is no distension.  Musculoskeletal:     Cervical back: Neck supple.  Skin:    General: Skin is warm.     Capillary Refill: Capillary refill takes less than 2 seconds.     Findings: No rash.  Neurological:     Mental Status: She is alert. She is disoriented.     Motor: No abnormal muscle tone.       ____________________________________________   LABS (all labs ordered are listed, but only abnormal results are displayed)  Labs Reviewed  COMPREHENSIVE METABOLIC PANEL - Abnormal; Notable for the following components:      Result Value   CO2 35 (*)    Glucose, Bld 306 (*)    BUN 58 (*)    Creatinine, Ser 1.76 (*)    Albumin 2.6 (*)    GFR calc non Af Amer 29 (*)    GFR calc Af Amer 33 (*)    All other components within normal limits  CBC WITH DIFFERENTIAL/PLATELET - Abnormal; Notable for the following components:   Monocytes Absolute 1.1 (*)    All other components  within normal limits  PROTIME-INR - Abnormal; Notable for the following components:   Prothrombin Time 17.3 (*)    INR 1.4 (*)    All other components within normal limits  URINALYSIS, ROUTINE W REFLEX MICROSCOPIC - Abnormal; Notable for the following components:   Color, Urine AMBER (*)    APPearance CLOUDY (*)    Glucose, UA 50 (*)    Hgb urine dipstick SMALL (*)    Ketones, ur 5 (*)    Protein, ur 100 (*)  Bacteria, UA RARE (*)    All other components within normal limits  FIBRIN DERIVATIVES D-DIMER ([facility] ONLY) - Abnormal; Notable for the following components:   Fibrin derivatives D-dimer ([facility]) 878.79 (*)    All other components within normal limits  C-REACTIVE PROTEIN - Abnormal; Notable for the following components:   CRP 9.1 (*)    All other components within normal limits  BLOOD GAS, VENOUS - Abnormal; Notable for the following components:   pO2, Ven 63.0 (*)    Bicarbonate 38.3 (*)    Acid-Base Excess 11.4 (*)    All other components within normal limits  TRIGLYCERIDES - Abnormal; Notable for the following components:   Triglycerides 300 (*)    All other components within normal limits  VALPROIC ACID LEVEL - Abnormal; Notable for the following components:   Valproic Acid Lvl 19 (*)    All other components within normal limits  BLOOD GAS, ARTERIAL - Abnormal; Notable for the following components:   pH, Arterial 7.55 (*)    pO2, Arterial 74 (*)    Bicarbonate 35.0 (*)    Acid-Base Excess 11.6 (*)    All other components within normal limits  CULTURE, BLOOD (ROUTINE X 2)  CULTURE, BLOOD (ROUTINE X 2)  URINE CULTURE  RESPIRATORY PANEL BY RT PCR (FLU A&B, COVID)  LACTIC ACID, PLASMA  LACTIC ACID, PLASMA  PROCALCITONIN    ____________________________________________   ________________________________________  RADIOLOGY All imaging, including plain films, CT scans, and ultrasounds, independently reviewed by me, and interpretations confirmed via formal radiology  reads.  ED MD interpretation:   CT Head; NAICA CXR: low lung volumes, no acute disease CXR2: ET tube in appropriate position, bibasilar atelectasis  Official radiology report(s): CT Head Wo Contrast  Result Date: 09/07/2019 CLINICAL DATA:  Altered mental status with tachypnea and fever. COVID-19 positive EXAM: CT HEAD WITHOUT CONTRAST TECHNIQUE: Contiguous axial images were obtained from the base of the skull through the vertex without intravenous contrast. COMPARISON:  None. FINDINGS: Brain: There is mild diffuse atrophy. There is no intracranial mass, hemorrhage, extra-axial fluid collection, or midline shift. There is mild small vessel disease in the centra semiovale bilaterally. No acute infarct is evident on this study. Vascular: There is no hyperdense vessel. No appreciable vascular calcification. Skull: The bony calvarium appears intact. Sinuses/Orbits: There is mucosal thickening and opacification in the posterior sphenoid sinus region. There is mucosal thickening involving multiple ethmoid air cells. Orbits appear symmetric bilaterally. Other: Mastoid air cells are clear. IMPRESSION: Atrophy with mild periventricular small vessel disease. No evident acute infarct. No mass or hemorrhage. There are foci of paranasal sinus disease at several sites. Electronically Signed   By: Bretta Bang III M.D.   On: 09/07/2019 15:13   DG Chest Portable 1 View  Result Date: 09/07/2019 CLINICAL DATA:  Post intubation. EXAM: PORTABLE CHEST 1 VIEW COMPARISON:  Earlier radiographs today. FINDINGS: 1243 hours. Interval intubation. Tip of the endotracheal tube is in the mid trachea, 3.2 cm above the carina. There are lower lung volumes with increased patchy opacities at both lung bases which may reflect atelectasis. There is no confluent airspace opacity, edema, pleural effusion or pneumothorax. The heart size and mediastinal contours are stable. IMPRESSION: Satisfactory position of the endotracheal tube. Lower  lung volumes with probable increased bibasilar atelectasis. Cannot exclude early aspiration. Electronically Signed   By: Carey Bullocks M.D.   On: 09/07/2019 13:17   DG Chest Port 1 View  Result Date: 09/07/2019 CLINICAL DATA:  Sepsis.  COVID-19  positive. EXAM: PORTABLE CHEST 1 VIEW COMPARISON:  None. FINDINGS: Midline trachea. Borderline cardiomegaly. No pleural effusion or pneumothorax. Interstitial prominence is lower lobe predominant, accentuated by low lung volumes and AP portable technique-nonspecific. No lobar consolidation. IMPRESSION: Mildly low lung volumes, without acute disease. Electronically Signed   By: Jeronimo Greaves M.D.   On: 09/07/2019 11:01    ____________________________________________  PROCEDURES   Procedure(s) performed (including Critical Care):  .Critical Care Performed by: Shaune Pollack, MD Authorized by: Shaune Pollack, MD   Critical care provider statement:    Critical care time (minutes):  75   Critical care time was exclusive of:  Separately billable procedures and treating other patients and teaching time   Critical care was necessary to treat or prevent imminent or life-threatening deterioration of the following conditions:  Cardiac failure, circulatory failure and respiratory failure   Critical care was time spent personally by me on the following activities:  Development of treatment plan with patient or surrogate, discussions with consultants, evaluation of patient's response to treatment, examination of patient, obtaining history from patient or surrogate, ordering and performing treatments and interventions, ordering and review of laboratory studies, ordering and review of radiographic studies, pulse oximetry, re-evaluation of patient's condition and review of old charts   I assumed direction of critical care for this patient from another provider in my specialty: no   Procedure Name: Intubation Date/Time: 09/07/2019 3:50 PM Performed by: Shaune Pollack, MD Pre-anesthesia Checklist: Patient identified, Patient being monitored, Emergency Drugs available, Timeout performed and Suction available Oxygen Delivery Method: Non-rebreather mask Preoxygenation: Pre-oxygenation with 100% oxygen Induction Type: Rapid sequence Ventilation: Mask ventilation without difficulty Laryngoscope Size: Glidescope and 3 Grade View: Grade I Tube size: 7.0 mm Number of attempts: 1 Airway Equipment and Method: Video-laryngoscopy Placement Confirmation: ETT inserted through vocal cords under direct vision,  CO2 detector and Breath sounds checked- equal and bilateral Dental Injury: Teeth and Oropharynx as per pre-operative assessment  Difficulty Due To: Difficulty was unanticipated Future Recommendations: Recommend- induction with short-acting agent, and alternative techniques readily available       ____________________________________________  INITIAL IMPRESSION / MDM / ASSESSMENT AND PLAN / ED COURSE  As part of my medical decision making, I reviewed the following data within the electronic MEDICAL RECORD NUMBER Nursing notes reviewed and incorporated, Old chart reviewed, Notes from prior ED visits, and [place] Controlled Substance Database       *Becky Hendricks was evaluated in Emergency Department on 09/07/2019 for the symptoms described in the history of present illness. She was evaluated in the context of the global COVID-19 pandemic, which necessitated consideration that the patient might be at risk for infection with the SARS-CoV-2 virus that causes COVID-19. Institutional protocols and algorithms that pertain to the evaluation of patients at risk for COVID-19 are in a state of rapid change based on information released by regulatory bodies including the CDC and federal and state organizations. These policies and algorithms were followed during the patient's care in the ED.  Some ED evaluations and interventions may be delayed as a result of limited  staffing during the pandemic.*     Medical Decision Making: 74 year old female here with acute hypoxic respiratory failure secondary to COVID-19.  Confirmed Covid positive test at her facility via fax.  On arrival, the patient is markedly tachypneic, altered.  Patient started on fluids, Decadron, remdesivir, and antipyretics with minimal improvement.  She became increasingly tachypneic and drowsy, raising concern for impending airway compromise.  Decision made to intubate, which  patient tolerated well as above.  Post intubation gas shows mild respiratory alkalosis and hypoxia, which was adjusted with increase in PEEP and FiO2 as well as decrease in rate, though she is breathing over the vent at this time.  Otherwise, lab work is actually overall fairly reassuring.  Lactic acid is normal.  Her procalcitonin is negative.  Chest x-ray does not show significant focal pneumonia, though she does have possible aspiration on her post intubation chest x-ray, which would fit with her altered mental status prior to the intubation.  Patient has been given antibiotics.  She will be admitted to [facility].  I discussed with her brother via telephone, who is aware of the patient's critical illness, prognosis, and transfer.    ____________________________________________  FINAL CLINICAL IMPRESSION(S) / ED DIAGNOSES  Final diagnoses:  Acute hypoxemic respiratory failure due to COVID-19 [facility])     MEDICATIONS GIVEN DURING THIS VISIT:  Medications  propofol (DIPRIVAN) 1000 MG/100ML infusion (30 mcg/kg/min  100 kg Intravenous Rate/Dose Change 09/07/19 1516)  remdesivir 200 mg in sodium chloride 0.9% 250 mL IVPB (has no administration in time range)    Followed by  remdesivir 100 mg in sodium chloride 0.9 % 100 mL IVPB (has no administration in time range)  vancomycin (VANCOREADY) IVPB 2000 mg/400 mL (has no administration in time range)  acetaminophen (TYLENOL) suppository 650 mg (has no  administration in time range)  lactated ringers bolus 1,000 mL (0 mLs Intravenous Stopped 09/07/19 1214)  acetaminophen (TYLENOL) suppository 650 mg (650 mg Rectal Given 09/07/19 1105)  dexamethasone (DECADRON) injection 10 mg (10 mg Intravenous Given 09/07/19 1114)  etomidate (AMIDATE) injection 20 mg (20 mg Intravenous Given 09/07/19 1229)  succinylcholine (ANECTINE) injection 150 mg (150 mg Intravenous Given 09/07/19 1229)  sodium chloride 0.9 % bolus 1,000 mL (0 mLs Intravenous Stopped 09/07/19 1402)  ceFEPIme (MAXIPIME) 2 g in sodium chloride 0.9 % 100 mL IVPB (0 g Intravenous Stopped 09/07/19 1432)     ED Discharge Orders    None       Note:  This document was prepared using Dragon voice recognition software and may include unintentional dictation errors.   Shaune Pollack, MD 09/07/19 [phone]

## 2019-09-07 NOTE — Consult Note (Signed)
Remdesivir - Pharmacy Brief Note   A/P:  Remdesivir 200 mg IVPB once followed by 100 mg IVPB daily x 4 days.   Albina Billet, PharmD, BCPS Clinical Pharmacist 09/07/2019 12:46 PM

## 2019-09-07 NOTE — Progress Notes (Signed)
Pt arrived to unit from [facility] via [facility]. 7.0 ETT secured at 23cm at the lip. Per Dr. Gaynell Face okay to place pt on the following vent settings and obtain ABG: PRVC 410/22/80%/+10.

## 2019-09-07 NOTE — Progress Notes (Signed)
Transported the patient to CT and back without issue.

## 2019-09-07 NOTE — Consult Note (Signed)
PHARMACY -  BRIEF ANTIBIOTIC NOTE   Pharmacy has received consult(s) for Cefepime/Vancomycin from an ED provider.  The patient's profile has been reviewed for ht/wt/allergies/indication/available labs.    One time order(s) placed for Vancomycin 2000mg  IV x 1 and Cefepime 2g IV x 1  Further antibiotics/pharmacy consults should be ordered by admitting physician if indicated.                       Thank you,  , PharmD, BCPS Clinical Pharmacist 09/07/2019 1:32 PM

## 2019-09-07 NOTE — ED Notes (Signed)
EMTALA reviewed. 

## 2019-09-07 NOTE — ED Notes (Signed)
Report given to [name] at [facility]

## 2019-09-07 NOTE — Consult Note (Signed)
CODE SEPSIS - PHARMACY COMMUNICATION  **Broad Spectrum Antibiotics should be administered within 1 hour of Sepsis diagnosis**  Time Code Sepsis Called/Page Received: 1025  Antibiotics Ordered: Cefepime/Vancomycin  Time of 1st antibiotic administration: 1402  Additional action taken by pharmacy: None  If necessary, Name of Provider/Nurse Contacted: N/A   Albina Billet, PharmD, BCPS Clinical Pharmacist 09/07/2019 2:05 PM

## 2019-09-07 NOTE — ED Triage Notes (Signed)
[facility]: resident at [facility] care.  Call was for altered mental status first noticed today.  Patient febrile, tachycardic, and tacypnic, covid + since the 17th.  Hx of afib, DM, and bipolar disorder.   Was recently seen at [facility] for altered mental status, ems reports that the cause of that was related to medication changes for her bipolar disorder.    Patient is semi alert, tracking, responds to verbal stimuli, speech is incomprehensible, and is not following commands.

## 2019-09-07 NOTE — Plan of Care (Signed)
  Problem: Respiratory: Goal: Will maintain a patent airway Outcome: Progressing   Problem: Respiratory: Goal: Complications related to the disease process, condition or treatment will be avoided or minimized Outcome: Progressing   

## 2019-09-07 NOTE — ED Notes (Signed)
Gave Remdesivir to [employer]

## 2019-09-07 NOTE — H&P (Signed)
NAME:  Becky Hendricks, MRN:  [id number], DOB:  July 10, 1946, LOS: 0 ADMISSION DATE:  (Not on file), CONSULTATION DATE:  09/07/19 REFERRING MD:  Delma Freeze , CHIEF COMPLAINT:  Acute hypoxemic resp failure  Brief History   74 yo with recent dx of covid 19 1/17 cont to decline in outpt snf, presented to [facility] with ams and subsequent need for intubation.   History of present illness   All history obtained from chart review as pt intubated sedated and unable to provide history:  Per osh records:  Becky Hendricks is a 74 y.o. female with past medical history of bipolar disorder, atrial fibrillation, GERD obesity, diabetes, here with altered mental status.  Per review of records, the patient was just recently admitted at [facility] in December.  She was admitted for altered mental status as well as C. difficile colitis.  Per discussion with her brother, she had a history of psychiatric disease, but was otherwise living independently prior to this.  She was discharged to a nursing home and brother states she has progressively declined since then.  Per report, she was diagnosed with Covid on 1/17.  She has been progressively worsening since then.  Of last 24 hours, she has acutely worsened and is now more confused, altered. She has had increased WOB.  Pt arrived to our ICU intubated sedated and unresponsive. She does spontaneously move all 4 extremities but not to command.   Past Medical History   Past Medical History:  Diagnosis Date  . Atrial fibrillation ([place])   . Bipolar disorder ([place])   . Clostridium difficile diarrhea 08/02/2019  . Dehydration   . Essential hypertension   . Morbid obesity ([place])   . Type 2 diabetes mellitus ([place])   . Weakness     Significant Hospital Events   1/25: admitted to [facility]  Consults:    Procedures:    Significant Diagnostic Tests:  1/25 CTH: Atrophy with mild periventricular small vessel disease. No evident acute infarct. No mass or hemorrhage.  There are foci of  paranasal sinus disease at several sites.  Micro Data:  1/17 covid: POSITIVE 1/25 blood:  1/25 urine   Antimicrobials:  remdesivir 1/25->  Interim history/subjective:  1/25: transferred from [facility]   Objective   There were no vitals taken for this visit.    Vent Mode: AC FiO2 (%):  [60 %-100 %] 80 % Set Rate:  [14 bmp-16 bmp] 14 bmp Vt Set:  [450 mL] 450 mL PEEP:  [5 cmH20-8 cmH20] 8 cmH20  No intake or output data in the 24 hours ending 09/07/19 1607 There were no vitals filed for this visit.  Examination: General: sedated unresponsive on vent HEENT: NCAT, PERRLA, MMMP Lungs: CTA, diminished bilaterally, no wheezes, rhonchi, rales Cardiovascular: RRR, no m/g/r Abdomen: obese, soft, NT,ND, BS+ Extremities: + edema Skin: no rashes, warm and dry Neuro: moves all 4 extremities, not to command   Resolved Hospital Problem list     Assessment & Plan:  Acute hypoxic resp failure 2/2 covid 19 infection:  -Decadron 6mg  daily x 10 days -Remdesivir x 5 days -a/c via eliquis (on chronically for afib) -Continue mechanical ventilation per ARDS protocol -Target TVol 6-8cc/kgIBW -Target Plateau Pressure < 30cm H20 -Target driving pressure less than 15 cm of water -Target PaO2 55-65: titrate PEEP/FiO2 per protocol -Target CVP less than 4, diurese as necessary -Ventilator associated pneumonia prevention protocol -cxr with minimal patchy infiltrates, personally reviewed -check abg and prone if P/F ratio  Sepsis 2/2 covid 19  infection:  -as above.  -hemodynamically stable at this time.   Bipolar d/o:  -cont home seroquel Ams/acute encephalopathy:  -cth thankfully negative.  -monitor mental status closely.  -minimize sedation as resp status allows.   ARF: -Cr 1.76 will f/u repeat labs in am  -monitor uop -unknown baseline  Afib:  -cont eliquis from home HTN:  -hold home meds for now.    GERD:  -famotidine  T2DM:  -check a1c -ssi -hold home  metformin/victoza    Best practice:  Diet: start tf Pain/Anxiety/Delirium protocol (if indicated): per protocol VAP protocol (if indicated): per protocol DVT prophylaxis: lovenox GI prophylaxis: famotidine Glucose control:  Mobility: bedrest Code Status: FULL Family Communication:  Disposition: ICU  Labs   CBC: Recent Labs  Lab 09/07/19 1000  WBC 7.7  NEUTROABS 4.5  HGB 13.8  HCT 45.3  MCV 98.9  PLT 223    Basic Metabolic Panel: Recent Labs  Lab 09/07/19 1000  NA 145  K 5.0  CL 99  CO2 35*  GLUCOSE 306*  BUN 58*  CREATININE 1.76*  CALCIUM 9.0   GFR: CrCl cannot be calculated (Unknown ideal weight.). Recent Labs  Lab 09/07/19 1000 09/07/19 1404  PROCALCITON 0.16  --   WBC 7.7  --   LATICACIDVEN 1.6 1.8    Liver Function Tests: Recent Labs  Lab 09/07/19 1000  AST 39  ALT 34  ALKPHOS 38  BILITOT 0.8  PROT 7.3  ALBUMIN 2.6*   No results for input(s): LIPASE, AMYLASE in the last 168 hours. No results for input(s): AMMONIA in the last 168 hours.  ABG    Component Value Date/Time   PHART 7.55 (H) 09/07/2019 1442   PCO2ART 40 09/07/2019 1442   PO2ART 74 (L) 09/07/2019 1442   HCO3 35.0 (H) 09/07/2019 1442   O2SAT 96.5 09/07/2019 1442     Coagulation Profile: Recent Labs  Lab 09/07/19 1000  INR 1.4*    Cardiac Enzymes: No results for input(s): CKTOTAL, CKMB, CKMBINDEX, TROPONINI in the last 168 hours.  HbA1C: No results found for: HGBA1C  CBG: No results for input(s): GLUCAP in the last 168 hours.  Review of Systems:   Unobtainable 2/2 pt's intubated status  Past Medical History  She,  has a past medical history of Atrial fibrillation (HCC), Bipolar disorder (HCC), Clostridium difficile diarrhea (08/02/2019), Dehydration, Essential hypertension, Morbid obesity (HCC), Type 2 diabetes mellitus (HCC), and Weakness.   Surgical History   undetermined   Social History      Family History   Her family history is not on file.    Allergies Not on File   Home Medications  Prior to Admission medications   Medication Sig Start Date End Date Taking? Authorizing Provider  amLODipine (NORVASC) 5 MG tablet Take 5 mg by mouth daily. 08/11/19 08/10/20  [provider]  apixaban (ELIQUIS) 5 MG TABS tablet Take 5 mg by mouth daily. 08/11/19 08/05/20  [provider]  ascorbic acid (VITAMIN C) 1000 MG tablet Take 500 mg by mouth 2 (two) times daily. 09/08/12   [provider]  Calcium Carbonate-Vitamin D 600-200 MG-UNIT TABS Take 1 tablet by mouth daily. 09/08/12   [provider]  divalproex (DEPAKOTE) 500 MG DR tablet Take 500 mg by mouth daily. 06/13/12   [provider]  liraglutide (VICTOZA) 18 MG/3ML SOPN Inject 1.8 mg into the skin daily. 07/23/19   [provider]  lisinopril (ZESTRIL) 20 MG tablet Take 20 mg by mouth daily. 08/11/19 08/05/20  [provider]  Melatonin 3 MG TABS Take 3 mg by mouth at bedtime. 08/11/19 08/05/20  [provider]  metFORMIN (GLUCOPHAGE-XR) 500 MG 24 hr tablet Take 1,000 mg by mouth 2 (two) times daily. 10/22/17   [provider]  metoprolol tartrate (LOPRESSOR) 25 MG tablet Take 25 mg by mouth 2 (two) times daily. 08/11/19 09/10/19  [provider]  Multiple Vitamin (MULTIVITAMIN WITH MINERALS) TABS tablet Take 1 tablet by mouth daily.    [provider]  Omega-3 Fatty Acids (FISH OIL) 1200 MG CAPS Take 1,200 mg by mouth daily.    [provider]  QUEtiapine (SEROQUEL) 100 MG tablet Take 2.5 tablets by mouth at bedtime. 06/13/12   [provider]     Critical care time: The patient is critically ill with multiple organ systems failure and requires high complexity decision making for assessment and support, frequent evaluation and titration of therapies, application of advanced monitoring technologies and extensive interpretation of multiple databases.  Critical care time 49 mins.  This represents my time independent of the NP's/PA's/med students/residents time taking care of the pt. This is excluding procedures.     Briant Sites DO [facility] 09/07/2019, 4:07 PM

## 2019-09-08 ENCOUNTER — Inpatient Hospital Stay (HOSPITAL_COMMUNITY): Payer: Medicare Other

## 2019-09-08 DIAGNOSIS — J9601 Acute respiratory failure with hypoxia: Secondary | ICD-10-CM

## 2019-09-08 DIAGNOSIS — U071 COVID-19: Secondary | ICD-10-CM

## 2019-09-08 DIAGNOSIS — N179 Acute kidney failure, unspecified: Secondary | ICD-10-CM

## 2019-09-08 DIAGNOSIS — J80 Acute respiratory distress syndrome: Secondary | ICD-10-CM

## 2019-09-08 LAB — GLUCOSE, CAPILLARY
Glucose-Capillary: 110 mg/dL — ABNORMAL HIGH (ref 70–99)
Glucose-Capillary: 113 mg/dL — ABNORMAL HIGH (ref 70–99)
Glucose-Capillary: 118 mg/dL — ABNORMAL HIGH (ref 70–99)
Glucose-Capillary: 118 mg/dL — ABNORMAL HIGH (ref 70–99)
Glucose-Capillary: 119 mg/dL — ABNORMAL HIGH (ref 70–99)
Glucose-Capillary: 122 mg/dL — ABNORMAL HIGH (ref 70–99)
Glucose-Capillary: 128 mg/dL — ABNORMAL HIGH (ref 70–99)
Glucose-Capillary: 135 mg/dL — ABNORMAL HIGH (ref 70–99)
Glucose-Capillary: 143 mg/dL — ABNORMAL HIGH (ref 70–99)
Glucose-Capillary: 176 mg/dL — ABNORMAL HIGH (ref 70–99)
Glucose-Capillary: 184 mg/dL — ABNORMAL HIGH (ref 70–99)
Glucose-Capillary: 187 mg/dL — ABNORMAL HIGH (ref 70–99)
Glucose-Capillary: 190 mg/dL — ABNORMAL HIGH (ref 70–99)
Glucose-Capillary: 229 mg/dL — ABNORMAL HIGH (ref 70–99)
Glucose-Capillary: 282 mg/dL — ABNORMAL HIGH (ref 70–99)
Glucose-Capillary: 324 mg/dL — ABNORMAL HIGH (ref 70–99)

## 2019-09-08 LAB — POCT I-STAT 7, (LYTES, BLD GAS, ICA,H+H)
Acid-Base Excess: 10 mmol/L — ABNORMAL HIGH (ref 0.0–2.0)
Acid-Base Excess: 5 mmol/L — ABNORMAL HIGH (ref 0.0–2.0)
Bicarbonate: 33.2 mmol/L — ABNORMAL HIGH (ref 20.0–28.0)
Bicarbonate: 31.4 mmol/L — ABNORMAL HIGH (ref 20.0–28.0)
Calcium, Ion: 1.2 mmol/L (ref 1.15–1.40)
Calcium, Ion: 1.18 mmol/L (ref 1.15–1.40)
HCT: 33 % — ABNORMAL LOW (ref 36.0–46.0)
HCT: 35 % — ABNORMAL LOW (ref 36.0–46.0)
Hemoglobin: 11.2 g/dL — ABNORMAL LOW (ref 12.0–15.0)
Hemoglobin: 11.9 g/dL — ABNORMAL LOW (ref 12.0–15.0)
O2 Saturation: 100 %
O2 Saturation: 92 %
Patient temperature: 38.1
Patient temperature: 100
Potassium: 3.7 mmol/L (ref 3.5–5.1)
Potassium: 4.7 mmol/L (ref 3.5–5.1)
Sodium: 146 mmol/L — ABNORMAL HIGH (ref 135–145)
Sodium: 141 mmol/L (ref 135–145)
TCO2: 34 mmol/L — ABNORMAL HIGH (ref 22–32)
TCO2: 33 mmol/L — ABNORMAL HIGH (ref 22–32)
pCO2 arterial: 41.5 mmHg (ref 32.0–48.0)
pCO2 arterial: 53.9 mmHg — ABNORMAL HIGH (ref 32.0–48.0)
pH, Arterial: 7.515 — ABNORMAL HIGH (ref 7.350–7.450)
pH, Arterial: 7.376 (ref 7.350–7.450)
pO2, Arterial: 163 mmHg — ABNORMAL HIGH (ref 83.0–108.0)
pO2, Arterial: 70 mmHg — ABNORMAL LOW (ref 83.0–108.0)

## 2019-09-08 LAB — TYPE AND SCREEN
ABO/RH(D): O POS
Antibody Screen: NEGATIVE

## 2019-09-08 LAB — TRIGLYCERIDES: Triglycerides: 269 mg/dL — ABNORMAL HIGH (ref <150)

## 2019-09-08 LAB — HEMOGLOBIN A1C
Hgb A1c MFr Bld: 9.5 % — ABNORMAL HIGH (ref 4.8–5.6)
Mean Plasma Glucose: 225.95 mg/dL

## 2019-09-08 LAB — LACTIC ACID, PLASMA
Lactic Acid, Venous: 2.6 mmol/L (ref 0.5–1.9)
Lactic Acid, Venous: 3.3 mmol/L (ref 0.5–1.9)

## 2019-09-08 LAB — MRSA PCR SCREENING: MRSA by PCR: NEGATIVE

## 2019-09-08 LAB — ABO/RH: ABO/RH(D): O POS

## 2019-09-08 IMAGING — DX DG CHEST 1V PORT
1 series · 1 of 1 positions shown · non-contrast
Comparison: Yesterday

CLINICAL DATA: Endotracheal tube placement and central line.

EXAM:
PORTABLE CHEST 1 VIEW

[chest ap]
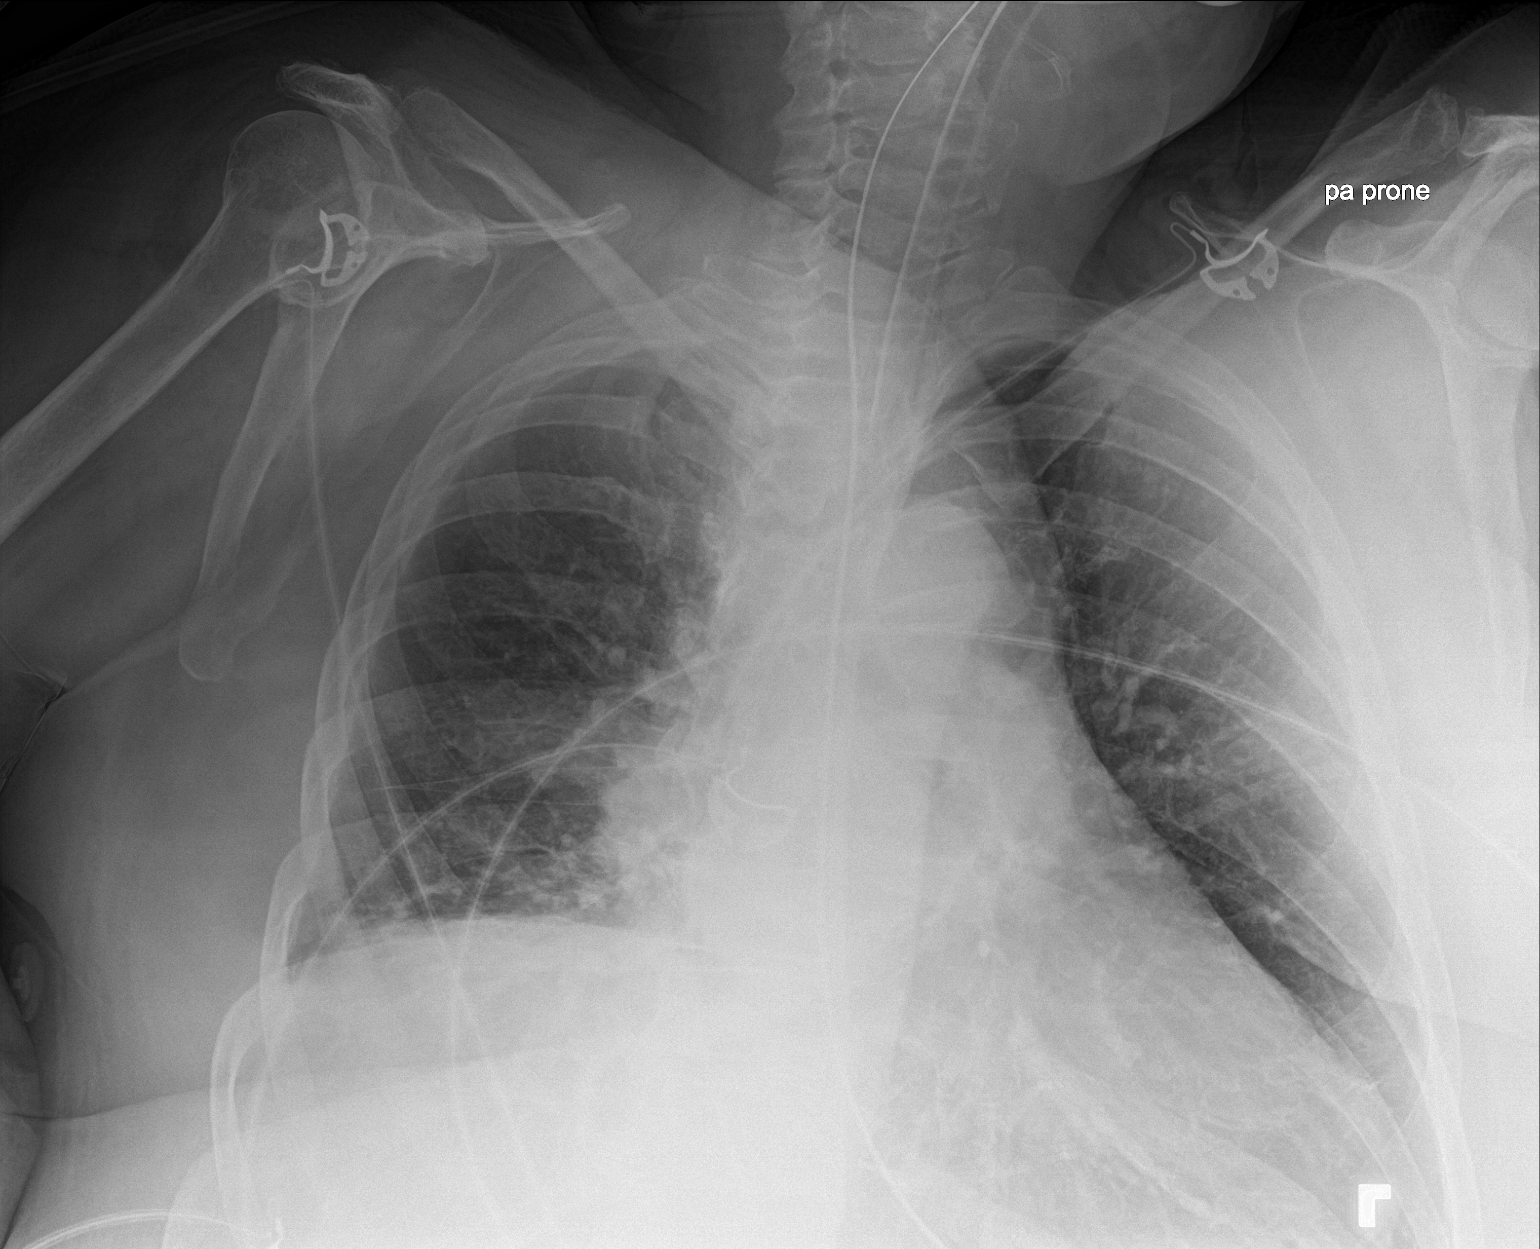

[1 of 1 positions shown; findings below may reference images not displayed]

FINDINGS: Endotracheal tube tip is just above the clavicular heads, 7 cm above
the carina. The enteric tube at least reaches the stomach.

Asymmetric elevation of the right diaphragm. There are indistinct
opacities at the bases, especially on the right. No evidence of
effusion or pneumothorax.
IMPRESSION: 1. Higher endotracheal tube, tip 7 cm above the carina.
2. Worsened aeration at the right base with atelectatic type
appearance.

## 2019-09-08 MED ORDER — LACTATED RINGERS IV SOLN: INTRAVENOUS | Status: DC

## 2019-09-08 MED ORDER — LACTATED RINGERS IV SOLN
INTRAVENOUS | Status: DC
  Administered 2019-09-09: 21:00:00 50 mL/h via INTRAVENOUS

## 2019-09-08 MED ORDER — SODIUM CHLORIDE 0.9 % IV SOLN
200.0000 mg | Freq: Once | INTRAVENOUS | Status: AC
  Administered 2019-09-08: 18:00:00 200 mg via INTRAVENOUS
  Filled 2019-09-08: qty 40

## 2019-09-08 MED ORDER — SODIUM CHLORIDE 0.9 % IV SOLN
2.0000 g | INTRAVENOUS | Status: DC
  Administered 2019-09-08: 14:00:00 2 g via INTRAVENOUS
  Filled 2019-09-08: qty 2

## 2019-09-08 MED ORDER — INSULIN DETEMIR 100 UNIT/ML ~~LOC~~ SOLN
5.0000 [IU] | Freq: Two times a day (BID) | SUBCUTANEOUS | Status: DC
  Administered 2019-09-08: 5 [IU] via SUBCUTANEOUS
  Filled 2019-09-08 (×2): qty 0.05

## 2019-09-08 MED ORDER — INSULIN ASPART 100 UNIT/ML ~~LOC~~ SOLN
0.0000 [IU] | SUBCUTANEOUS | Status: DC
  Administered 2019-09-08: 7 [IU] via SUBCUTANEOUS
  Administered 2019-09-08: 16:00:00 2 [IU] via SUBCUTANEOUS
  Administered 2019-09-08: 1 [IU] via SUBCUTANEOUS
  Administered 2019-09-09: 5 [IU] via SUBCUTANEOUS
  Administered 2019-09-09: 3 [IU] via SUBCUTANEOUS
  Administered 2019-09-09: 7 [IU] via SUBCUTANEOUS

## 2019-09-08 MED ORDER — SODIUM CHLORIDE 0.9 % IV SOLN
100.0000 mg | Freq: Every day | INTRAVENOUS | Status: AC
  Administered 2019-09-09 – 2019-09-12 (×4): 100 mg via INTRAVENOUS
  Filled 2019-09-08 (×4): qty 20

## 2019-09-08 MED ORDER — VITAL AF 1.2 CAL PO LIQD
1000.0000 mL | ORAL | Status: DC
  Administered 2019-09-08 – 2019-09-13 (×7): 1000 mL

## 2019-09-08 MED ORDER — PHENYLEPHRINE HCL-NACL 10-0.9 MG/250ML-% IV SOLN
0.0000 ug/min | INTRAVENOUS | Status: DC
  Administered 2019-09-08: 05:00:00 30 ug/min via INTRAVENOUS
  Administered 2019-09-08: 10 ug/min via INTRAVENOUS
  Filled 2019-09-08 (×2): qty 250

## 2019-09-08 MED ORDER — LACTATED RINGERS IV BOLUS
500.0000 mL | Freq: Once | INTRAVENOUS | Status: AC
  Administered 2019-09-08: 500 mL via INTRAVENOUS

## 2019-09-08 MED ORDER — FREE WATER
200.0000 mL | Status: DC
  Administered 2019-09-08 – 2019-09-14 (×35): 200 mL

## 2019-09-08 MED ORDER — DEXAMETHASONE SODIUM PHOSPHATE 10 MG/ML IJ SOLN
6.0000 mg | INTRAMUSCULAR | Status: AC
  Administered 2019-09-08 – 2019-09-17 (×10): 6 mg via INTRAVENOUS
  Filled 2019-09-08 (×10): qty 1

## 2019-09-08 MED ORDER — LINAGLIPTIN 5 MG PO TABS
5.0000 mg | ORAL_TABLET | Freq: Every day | ORAL | Status: DC
  Administered 2019-09-08 – 2019-09-14 (×7): 5 mg
  Filled 2019-09-08 (×7): qty 1

## 2019-09-08 MED ORDER — PRO-STAT SUGAR FREE PO LIQD
30.0000 mL | Freq: Three times a day (TID) | ORAL | Status: DC
  Administered 2019-09-08 – 2019-09-14 (×19): 30 mL
  Filled 2019-09-08 (×19): qty 30

## 2019-09-08 MED ORDER — INSULIN ASPART 100 UNIT/ML ~~LOC~~ SOLN: 1.0000 [IU] | SUBCUTANEOUS | Status: DC

## 2019-09-08 MED ORDER — INSULIN ASPART 100 UNIT/ML ~~LOC~~ SOLN
3.0000 [IU] | SUBCUTANEOUS | Status: DC
  Administered 2019-09-08 – 2019-09-10 (×12): 3 [IU] via SUBCUTANEOUS

## 2019-09-08 NOTE — Plan of Care (Signed)
  Problem: Education: Goal: Knowledge of risk factors and measures for prevention of condition will improve Outcome: Progressing   Problem: Coping: Goal: Psychosocial and spiritual needs will be supported Outcome: Progressing   Problem: Respiratory: Goal: Will maintain a patent airway Outcome: Progressing Goal: Complications related to the disease process, condition or treatment will be avoided or minimized Outcome: Progressing   

## 2019-09-08 NOTE — Progress Notes (Addendum)
NAME:  Becky Hendricks, MRN:  [id number], DOB:  02-Apr-1946, LOS: 1 ADMISSION DATE:  09/07/2019, CONSULTATION DATE:  09/07/19 REFERRING MD:  [facility]  CHIEF COMPLAINT:  Acute hypoxemic resp failure  Brief History   74 y/o F who presented to [facility] on 1/25 with reports of AMS.  She was recently admitted to [facility] in December for AMS & C-Diff Colitis.  Prior to this, she was living independently. She was discharged to a SNF and reportedly has declined since that time.  She was diagnosed with COVID on 1/17 and since that time has become progressively worse with confusion, increased WOB and acutely AMS the 24 hours prior to admit.  She was intubated in the ER for airway protection and transferred to [facility] 1/25 PM.      Past Medical History  AF  Bipolar Disorder  DM II  Morbid Obesity  Clostridium Difficile Diarrhea - 08/02/2019  Significant Hospital Events   1/17 Positive for COVID  1/25 Admit to [facility] after intubation in [facility] ER, COVID +  Consults:    Procedures:  ETT 1/25 >>   Significant Diagnostic Tests:  CT Head 1/25 >> atrophy with mild periventricular small vessel disease. No evident acute infarct. No mass or hemorrhage. There are foci of paranasal sinus disease at several sites.  Micro Data:  MRSA PCR 1/25 >> negative  BCx2 1/25 >>  Tracheal aspirate 1/25 >>   Antimicrobials / COVID Rx:  Remdesivir 1/25 >>  Interim history/subjective:  Tmax 101.3 Glucose Range 118-190 PEEP 10, FiO2 50%, Peak 28, Pplat 20-22. I/O - UOP, +1.2L in last 24h Remains on insulin gtt RT reports increased oral secretions but minimal from ETT  Objective   Blood pressure 113/69, pulse 73, temperature (!) 101.3 F (38.5 C), temperature source Bladder, resp. rate (!) 22, height 5\' 6"  (1.676 m), weight 109.5 kg, SpO2 100 %.    Vent Mode: PRVC FiO2 (%):  [60 %-100 %] 60 % Set Rate:  [14 bmp-22 bmp] 22 bmp Vt Set:  [410 mL-450 mL] 410 mL PEEP:  [5 cmH20-41 cmH20] 41 cmH20 Plateau Pressure:  [20 cmH20-21  cmH20] 21 cmH20   Intake/Output Summary (Last 24 hours) at 09/08/2019 0946 Last data filed at 09/08/2019 0900 Gross per 24 hour  Intake 1802.15 ml  Output 385 ml  Net 1417.15 ml   Filed Weights   09/07/19 2127 09/08/19 0444  Weight: 110.2 kg 109.5 kg    Examination: General: elderly female lying in bed on vent in prone position, critically ill appearing  HEENT: MM pink/moist, ETT Neuro: sedate CV: s1s2 RRR, distant tones, no m/r/g PULM: synchronous on vent, lungs bilaterally coarse, diminished  GI: soft, bsx4 active  Extremities: warm/dry, no edema  Skin: multiple areas of scabbing/dry skin, appears as if patient picks at skin, sacral decubitus (present on admit) dressing intact, peri area dry scabbing rash (appears like old healed moisture rash).  Abrasions on bilateral knees / dressed   CXR 1/26 >> images personally reviewed, prone positioning noted, increased right basilar airspace disease  Resolved Hospital Problem list     Assessment & Plan:   Acute Hypoxemic Respiratory Failure secondary to COVID PNA  -low Vt ventilation 4-8cc/kg -reduce rate to 16 as we were over ventilating  -goal plateau pressure <30, driving pressure 2/26 cm <08 -target PaO2 55-65, titrate PEEP/FiO2 per ARDS protocol  -P/F ratio <150, prone therapy for 16 hours per day -goal CVP <4, diuresis as necessary -VAP prevention measures  -follow intermittent CXR  -  decadron, remdesivir  -follow LFT's   Sepsis secondary to COVID Infection  -follow inflammatory markers -follow cultures -follow hemodynamic trends in ICU, currently stable / not on pressors   Bipolar Disorder -continue home seroquel   Acute Metabolic Encephalopathy  CT Head negative on admit.  -follow neuro exam  -minimize sedation as able    AKI  Cr 1.76 on transfer to [facility]. Unknown baseline.  -Trend BMP / urinary output -LR bolus 500 ml x1 with soft pressures, AKI.  Then at 8ml/hr -Replace electrolytes as indicated -Avoid  nephrotoxic agents, ensure adequate renal perfusion  Chronic AF -tele monitoring  -continue eliquis for anticoagulation   HTN -hold home agents   GERD -continue famotidine   DM II  Hgb A1c 9.5 -SSI, sensitive scale  -levemir 5 units BID  -add TF coverage, 3 units Q4 -hold home metformin, victoza  -linagliptin PT  Pressure Injuries  Noted on admit -wound care per nursing -keep areas clean /dry, barrier dressing to protect skin  -pad all areas of pressure   Best practice:  Diet: TF Pain/Anxiety/Delirium protocol (if indicated): per protocol VAP protocol (if indicated): per protocol DVT prophylaxis: apixaban GI prophylaxis: famotidine Glucose control: SSI Mobility: bedrest Code Status: FULL Family Communication: Brother [name]) updated via phone 1/26 on plan of care. Disposition: ICU  Labs   CBC: Recent Labs  Lab 09/07/19 1000 09/07/19 1824 09/07/19 1950 09/07/19 2207  WBC 7.7  --  7.7  --   NEUTROABS 4.5  --  5.5  --   HGB 13.8 12.2 12.0 11.2*  HCT 45.3 36.0 38.8 33.0*  MCV 98.9  --  99.2  --   PLT 223  --  176  --     Basic Metabolic Panel: Recent Labs  Lab 09/07/19 1000 09/07/19 1824 09/07/19 1950 09/07/19 2207  NA 145 142 143 143  K 5.0 4.9 4.8 4.3  CL 99  --  101  --   CO2 35*  --  27  --   GLUCOSE 306*  --  395*  --   BUN 58*  --  68*  --   CREATININE 1.76*  --  1.87*  --   CALCIUM 9.0  --  8.4*  --   MG  --   --  2.2  --   PHOS  --   --  2.5  --    GFR: Estimated Creatinine Clearance: 34.6 mL/min (A) (by C-G formula based on SCr of 1.87 mg/dL (H)). Recent Labs  Lab 09/07/19 1000 09/07/19 1404 09/07/19 1950 09/07/19 2325  PROCALCITON 0.16  --  0.16  --   WBC 7.7  --  7.7  --   LATICACIDVEN 1.6 1.8 2.0* 2.6*    Liver Function Tests: Recent Labs  Lab 09/07/19 1000 09/07/19 1950  AST 39 40  ALT 34 29  ALKPHOS 38 31*  BILITOT 0.8 1.1  PROT 7.3 6.0*  ALBUMIN 2.6* 2.2*   No results for input(s): LIPASE, AMYLASE in the last  168 hours. No results for input(s): AMMONIA in the last 168 hours.  ABG    Component Value Date/Time   PHART 7.515 (H) 09/07/2019 2207   PCO2ART 37.7 09/07/2019 2207   PO2ART 77.0 (L) 09/07/2019 2207   HCO3 30.4 (H) 09/07/2019 2207   TCO2 32 09/07/2019 2207   O2SAT 97.0 09/07/2019 2207     Coagulation Profile: Recent Labs  Lab 09/07/19 1000 09/07/19 1950  INR 1.4* 1.6*    Cardiac Enzymes: No results for input(s):  CKTOTAL, CKMB, CKMBINDEX, TROPONINI in the last 168 hours.  HbA1C: Hgb A1c MFr Bld  Date/Time Value Ref Range Status  09/07/2019 07:50 PM 9.5 (H) 4.8 - 5.6 % Final    Comment:    (NOTE) Pre diabetes:          5.7%-6.4% Diabetes:              >6.4% Glycemic control for   <7.0% adults with diabetes     CBG: Recent Labs  Lab 09/08/19 0513 09/08/19 0607 09/08/19 0701 09/08/19 0738 09/08/19 0851  GLUCAP 143* 119* 135* 118* 113*     Critical care time: 31 minutes    Noe Gens, MSN, NP-C [facility] 09/08/2019, 9:47 AM   Please see [email or web address] for pager details.

## 2019-09-08 NOTE — Progress Notes (Signed)
Pts head and arms repostioned. ETT remains secure at 23cm at the lip. RT will continue to monitor.

## 2019-09-08 NOTE — Progress Notes (Signed)
Initial Nutrition Assessment  DOCUMENTATION CODES:   Obesity unspecified  INTERVENTION:    Vital AF 1.2 at 40 ml/h (960 ml per day)   Pro-stat 30 ml TID   Provides 1452 kcal, 117 gm protein, 779 ml free water daily  NUTRITION DIAGNOSIS:   Increased nutrient needs related to acute illness, catabolic illness(COVID-19) as evidenced by estimated needs.  GOAL:   Patient will meet greater than or equal to 90% of their needs  MONITOR:   Vent status, TF tolerance, Labs, Skin  REASON FOR ASSESSMENT:   Ventilator, Consult Enteral/tube feeding initiation and management  ASSESSMENT:   74 yo female admitted from SNF with AMS and required intubation. Diagnosed with COVID-19 on 1/17. PMH includes A fib, C. Diff colitis 08/02/2019, Bipolar D/O, morbid obesity, HTN, DM-2.   Patient is currently intubated on ventilator support, requiring 1 pressor.  MV: 9.4 L/min Temp (24hrs), Avg:100.9 F (38.3 C), Min:98.4 F (36.9 C), Max:101.9 F (38.8 C)    Patient was proned last night.  Labs reviewed. Triglycerides 269 (H), ionized calcium 1.14 (L), A1C 9.5 (H) CBG's: [phone]  Medications reviewed and include novolog, levemir, neosynephrine. Currently on insulin drip.   Recent weight history unavailable.   NUTRITION - FOCUSED PHYSICAL EXAM:  unable to complete  Diet Order:   Diet Order            Diet NPO time specified  Diet effective now              EDUCATION NEEDS:   Not appropriate for education at this time  Skin:  Skin Assessment: Skin Integrity Issues: Skin Integrity Issues:: DTI, Unstageable DTI: buttocks & knees Unstageable: coccyx  Last BM:  No BM documented  Height:   Ht Readings from Last 1 Encounters:  09/07/19 5\' 6"  (1.676 m)    Weight:   Wt Readings from Last 1 Encounters:  09/08/19 109.5 kg    Ideal Body Weight:  59.1 kg  BMI:  Body mass index is 38.96 kg/m.  Estimated Nutritional Needs:   Kcal:  1440-1620  Protein:   >/= 118 gm  Fluid:  >/= 1.5 L    09/10/19, RD, LDN, CNSC Pager [phone] After Hours Pager [phone]

## 2019-09-08 NOTE — Progress Notes (Signed)
eLink Physician-Brief Progress Note Patient Name: Becky Hendricks DOB: 11/17/1945 MRN: [id number]   Date of Service  09/08/2019  HPI/Events of Note  Pt with recurrent hypotension despite 2 prior fluid boluses  eICU Interventions  Phenylephrine infusion ordered.        Mahitha Hickling U Ileanna Gemmill 09/08/2019, 1:11 AM

## 2019-09-08 NOTE — Progress Notes (Signed)
eLink Physician-Brief Progress Note Patient Name: Keyaira Clapham DOB: [birth year] MRN: [id number]   Date of Service  09/08/2019  HPI/Events of Note  POC blood sugar down to 119 mg %  eICU Interventions  Insulin transition orders placed.        Nyeli Holtmeyer U Montrail Mehrer 09/08/2019, 6:18 AM

## 2019-09-08 NOTE — Progress Notes (Signed)
Pt proned at this time w/o complication.

## 2019-09-09 ENCOUNTER — Inpatient Hospital Stay (HOSPITAL_COMMUNITY): Payer: Medicare Other

## 2019-09-09 LAB — COMPREHENSIVE METABOLIC PANEL
ALT: 31 U/L (ref 0–44)
AST: 36 U/L (ref 15–41)
Albumin: 2 g/dL — ABNORMAL LOW (ref 3.5–5.0)
Alkaline Phosphatase: 45 U/L (ref 38–126)
Anion gap: 10 (ref 5–15)
BUN: 63 mg/dL — ABNORMAL HIGH (ref 8–23)
CO2: 29 mmol/L (ref 22–32)
Calcium: 8.1 mg/dL — ABNORMAL LOW (ref 8.9–10.3)
Chloride: 104 mmol/L (ref 98–111)
Creatinine, Ser: 1.06 mg/dL — ABNORMAL HIGH (ref 0.44–1.00)
GFR calc Af Amer: >60 mL/min (ref >60)
GFR calc non Af Amer: 53 mL/min — ABNORMAL LOW (ref >60)
Glucose, Bld: 308 mg/dL — ABNORMAL HIGH (ref 70–99)
Potassium: 4.3 mmol/L (ref 3.5–5.1)
Sodium: 143 mmol/L (ref 135–145)
Total Bilirubin: 0.7 mg/dL (ref 0.3–1.2)
Total Protein: 5.5 g/dL — ABNORMAL LOW (ref 6.5–8.1)

## 2019-09-09 LAB — POCT I-STAT 7, (LYTES, BLD GAS, ICA,H+H)
Acid-Base Excess: 6 mmol/L — ABNORMAL HIGH (ref 0.0–2.0)
Bicarbonate: 30.6 mmol/L — ABNORMAL HIGH (ref 20.0–28.0)
Calcium, Ion: 1.22 mmol/L (ref 1.15–1.40)
HCT: 31 % — ABNORMAL LOW (ref 36.0–46.0)
Hemoglobin: 10.5 g/dL — ABNORMAL LOW (ref 12.0–15.0)
O2 Saturation: 98 %
Patient temperature: 98.6
Potassium: 4.5 mmol/L (ref 3.5–5.1)
Sodium: 140 mmol/L (ref 135–145)
TCO2: 32 mmol/L (ref 22–32)
pCO2 arterial: 45.5 mmHg (ref 32.0–48.0)
pH, Arterial: 7.435 (ref 7.350–7.450)
pO2, Arterial: 96 mmHg (ref 83.0–108.0)

## 2019-09-09 LAB — CBC
HCT: 37.3 % (ref 36.0–46.0)
Hemoglobin: 11.6 g/dL — ABNORMAL LOW (ref 12.0–15.0)
MCH: 30.8 pg (ref 26.0–34.0)
MCHC: 31.1 g/dL (ref 30.0–36.0)
MCV: 98.9 fL (ref 80.0–100.0)
Platelets: 163 10*3/uL (ref 150–400)
RBC: 3.77 MIL/uL — ABNORMAL LOW (ref 3.87–5.11)
RDW: 14.3 % (ref 11.5–15.5)
WBC: 9.2 10*3/uL (ref 4.0–10.5)
nRBC: 0 % (ref 0.0–0.2)

## 2019-09-09 LAB — URINE CULTURE: Culture: >=100000 — AB

## 2019-09-09 LAB — LACTIC ACID, PLASMA: Lactic Acid, Venous: 2.1 mmol/L (ref 0.5–1.9)

## 2019-09-09 LAB — GLUCOSE, CAPILLARY
Glucose-Capillary: 219 mg/dL — ABNORMAL HIGH (ref 70–99)
Glucose-Capillary: 259 mg/dL — ABNORMAL HIGH (ref 70–99)
Glucose-Capillary: 261 mg/dL — ABNORMAL HIGH (ref 70–99)
Glucose-Capillary: 273 mg/dL — ABNORMAL HIGH (ref 70–99)
Glucose-Capillary: 275 mg/dL — ABNORMAL HIGH (ref 70–99)
Glucose-Capillary: 302 mg/dL — ABNORMAL HIGH (ref 70–99)
Glucose-Capillary: 333 mg/dL — ABNORMAL HIGH (ref 70–99)

## 2019-09-09 IMAGING — DX DG CHEST 1V PORT
1 series · 1 of 1 positions shown · non-contrast
Comparison: Radiograph [DATE]

CLINICAL DATA: Acute respiratory failure with hypoxia

EXAM:
PORTABLE CHEST 1 VIEW

[chest ap]
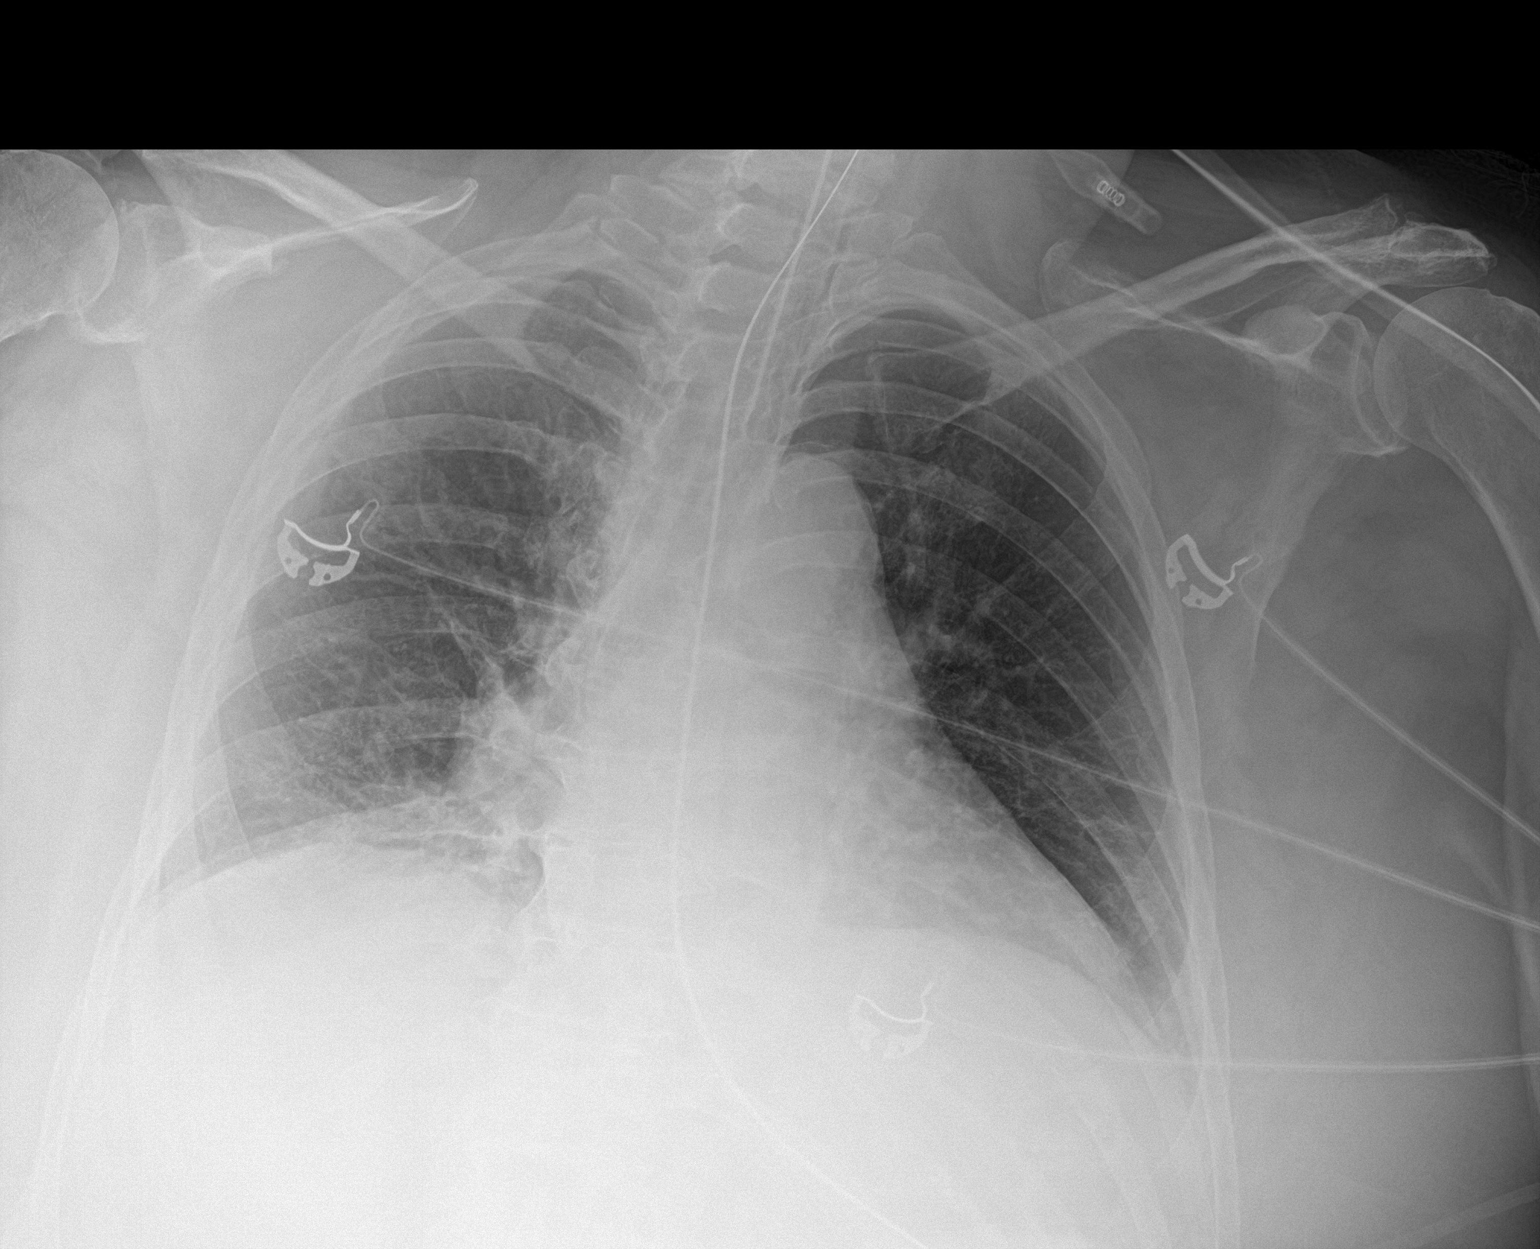

[1 of 1 positions shown; findings below may reference images not displayed]

FINDINGS: Endotracheal tube terminates in the mid trachea at the level of the
aortic arch. Transesophageal tube tip and side port distal to the GE
junction. Telemetry leads overlie the chest.

Some patchy right basilar opacity appears slightly more confluent
than on comparison exam. Opacities in the left lung base are similar
to slightly less and. No visible pneumothorax or effusion. The
cardiomediastinal contours are unremarkable. No acute osseous or
soft tissue abnormality. Degenerative changes are present in the
imaged spine and shoulders.
IMPRESSION: Slightly more confluent patchy right basilar opacity. Opacities in
the left lung base are similar to slightly lessened.

Support devices as above.

## 2019-09-09 MED ORDER — QUETIAPINE FUMARATE 50 MG PO TABS
250.0000 mg | ORAL_TABLET | Freq: Every day | ORAL | Status: DC
  Administered 2019-09-09 – 2019-09-13 (×5): 250 mg
  Filled 2019-09-09 (×6): qty 5

## 2019-09-09 MED ORDER — SODIUM CHLORIDE 0.9 % IV SOLN
1.0000 g | INTRAVENOUS | Status: DC
  Administered 2019-09-09 – 2019-09-12 (×4): 1 g via INTRAVENOUS
  Filled 2019-09-09: qty 10
  Filled 2019-09-09 (×2): qty 1
  Filled 2019-09-09: qty 10

## 2019-09-09 MED ORDER — PROPOFOL 1000 MG/100ML IV EMUL
0.0000 ug/kg/min | INTRAVENOUS | Status: DC
  Administered 2019-09-09: 20:00:00 25 ug/kg/min via INTRAVENOUS
  Administered 2019-09-09: 14:00:00 5 ug/kg/min via INTRAVENOUS
  Filled 2019-09-09 (×2): qty 100

## 2019-09-09 MED ORDER — VALPROIC ACID 250 MG/5ML PO SOLN
500.0000 mg | Freq: Every day | ORAL | Status: DC
  Administered 2019-09-09 – 2019-09-14 (×6): 500 mg
  Filled 2019-09-09 (×6): qty 10

## 2019-09-09 MED ORDER — INSULIN ASPART 100 UNIT/ML ~~LOC~~ SOLN
0.0000 [IU] | SUBCUTANEOUS | Status: DC
  Administered 2019-09-09: 5 [IU] via SUBCUTANEOUS
  Administered 2019-09-09 (×2): 8 [IU] via SUBCUTANEOUS
  Administered 2019-09-09 – 2019-09-10 (×3): 11 [IU] via SUBCUTANEOUS
  Administered 2019-09-10: 05:00:00 5 [IU] via SUBCUTANEOUS
  Administered 2019-09-10: 11:00:00 8 [IU] via SUBCUTANEOUS
  Administered 2019-09-10: 5 [IU] via SUBCUTANEOUS
  Administered 2019-09-10: 11 [IU] via SUBCUTANEOUS
  Administered 2019-09-11 (×2): 5 [IU] via SUBCUTANEOUS
  Administered 2019-09-11: 8 [IU] via SUBCUTANEOUS
  Administered 2019-09-11: 20:00:00 11 [IU] via SUBCUTANEOUS
  Administered 2019-09-11: 15 [IU] via SUBCUTANEOUS
  Administered 2019-09-11: 23:00:00 11 [IU] via SUBCUTANEOUS
  Administered 2019-09-12: 03:00:00 8 [IU] via SUBCUTANEOUS
  Administered 2019-09-12: 12:00:00 11 [IU] via SUBCUTANEOUS
  Administered 2019-09-12: 5 [IU] via SUBCUTANEOUS

## 2019-09-09 MED ORDER — INSULIN DETEMIR 100 UNIT/ML ~~LOC~~ SOLN
7.0000 [IU] | Freq: Two times a day (BID) | SUBCUTANEOUS | Status: DC
  Administered 2019-09-09 (×2): 7 [IU] via SUBCUTANEOUS
  Filled 2019-09-09 (×3): qty 0.07

## 2019-09-09 NOTE — Progress Notes (Signed)
eLink Physician-Brief Progress Note Patient Name: Becky Hendricks DOB: 10-15-1945 MRN: [id number]   Date of Service  09/09/2019  HPI/Events of Note  Large sacral decubitus wound and uncontrolled diarrhea.  eICU Interventions  Order entered to insert Flexiseal tube.        Thomasene Lot Tamia Dial 09/09/2019, 11:21 PM

## 2019-09-09 NOTE — Progress Notes (Signed)
PHARMACY - PHYSICIAN COMMUNICATION CRITICAL VALUE ALERT - BLOOD CULTURE IDENTIFICATION (BCID)  Adelise Buswell is an 75 y.o. female who presented to [facility] on 09/07/2019 with a chief complaint of COVID-19 pneumonia.  She was initially started on Cefepime which was narrowed to ceftriaxone for Klebsiella UTI.    Assessment:  1/2 bottles (only aerobic bottles collected in each set): GPC, other bottle is no growth. Pt seems to be clinically improving.  WBC wnl, Tm/24 hr improved to 100.2, PCT 0.16 (1/25).  Name of physician (or Provider) Contacted: Canary Brim, NP  Current antibiotics: Ceftriaxone  Changes to prescribed antibiotics recommended: None  Lynann Beaver PharmD, BCPS Clinical pharmacist phone 7am- 5pm: [phone] 09/09/2019 1:04 PM

## 2019-09-09 NOTE — Progress Notes (Signed)
Pts head and arms repositioned. ETT remains secure at 23cm and the suction catheter is able to be passed. Pt tolerated well, RT will continue to monitor.

## 2019-09-09 NOTE — Progress Notes (Signed)
Pt proned at this time w/o event.

## 2019-09-09 NOTE — Plan of Care (Addendum)
Pt remained proned throughout the shift. Arms and hips were repositioned every 2 hours. Pt will be supine tonight. Propofol started and versed drip turned off.   Problem: Education: Goal: Knowledge of risk factors and measures for prevention of condition will improve Outcome: Progressing   Problem: Coping: Goal: Psychosocial and spiritual needs will be supported Outcome: Progressing   Problem: Respiratory: Goal: Will maintain a patent airway Outcome: Progressing Goal: Complications related to the disease process, condition or treatment will be avoided or minimized Outcome: Progressing

## 2019-09-09 NOTE — Progress Notes (Signed)
Pts head and arms repositioned at this time with RT and RN x2. Pt tolerated well. RT able to pass suction catheter. ETT remains secure at 23cm. RT will continue to monitor.

## 2019-09-09 NOTE — Progress Notes (Signed)
Patient placed in supine position.  ETT secured.  No breakdown noted.  Patient tolerated well with no complications. 

## 2019-09-09 NOTE — Progress Notes (Signed)
Inpatient Diabetes Program Recommendations  AACE/ADA: New Consensus Statement on Inpatient Glycemic Control (2015)  Target Ranges:  Prepandial:   less than 140 mg/dL      Peak postprandial:   less than 180 mg/dL (1-2 hours)      Critically ill patients:  140 - 180 mg/dL   Lab Results  Component Value Date   GLUCAP 219 (H) 09/09/2019   HGBA1C 9.5 (H) 09/07/2019    Review of Glycemic Control  Diabetes history: DM 2 Outpatient Diabetes medications: Victoza 1.8 mg Daily, Metformin 1000 mg bid  Current orders for Inpatient glycemic control:  Levemir 7 units bid Novolog 0-15 units Q4 hours Novolog 3 units Q4 hours Tradjenta 5 mg Daily  Decadron 6 mg Q24 hours Vital AF 40 ml/hour  Inpatient Diabetes Program Recommendations:    Glucose trends increased after Tube Feed rate increased from 20 to 40 ml/hour Noted Levemir increased to 7 units bid and Correction scale increased.  Consider increasing Novolog Tube Feed Coverage to 6 units Q4 hours.  Thanks,  Christena Deem RN, MSN, BC-ADM Inpatient Diabetes Coordinator Team Pager [phone] (8a-5p)

## 2019-09-09 NOTE — Progress Notes (Addendum)
NAME:  Becky Hendricks, MRN:  [id number], DOB:  01-07-1946, LOS: 2 ADMISSION DATE:  09/07/2019, CONSULTATION DATE:  09/07/19 REFERRING MD:  [facility]  CHIEF COMPLAINT:  Acute hypoxemic resp failure  Brief History   74 y/o F who presented to [facility] on 1/25 with reports of AMS.  She was recently admitted to [facility] in December for AMS & C-Diff Colitis and discharged to a SNF in [place].  She quarantined for 2 weeks. After she was put into general population, she diagnosed with COVID on 1/17.  Prior to this, she was living independently.  After the COVID diagnosis, she become progressively worse with confusion, increased WOB and acutely AMS the 24 hours prior to admit.  She was intubated in the ER for airway protection and transferred to [facility] 1/25 PM.      Past Medical History  AF  Bipolar Disorder  DM II  Morbid Obesity  Clostridium Difficile Diarrhea - 08/02/2019.  Brother found her lying in stool at that time.   Significant Hospital Events   1/17 Positive for COVID  1/25 Admit to [facility] after intubation in [facility] ER, COVID +  Consults:    Procedures:  ETT 1/25 >>   Significant Diagnostic Tests:  CT Head 1/25 >> atrophy with mild periventricular small vessel disease. No evident acute infarct. No mass or hemorrhage. There are foci of paranasal sinus disease at several sites.  Micro Data:  MRSA PCR 1/25 >> negative  BCx2 1/25 >>  Tracheal aspirate 1/25 >>  UC 1/25 >> 100k Klebsiella >> sensitive ceftriaxone  Antimicrobials / COVID Rx:  Remdesivir 1/25 >> Cefepime 1/26 >> 1/26  Ceftriaxone 1/27 >>  Interim history/subjective:  Tmax 98.8 Glucose Range 219-333 PEEP 10, FiO2 50%, peak 24, Pplat 19 I/O 1.6L UOP, +463ml in 24 hours Remains prone.   Klebsiella in urine culture  Objective   Blood pressure 123/67, pulse (!) 53, temperature 98.8 F (37.1 C), temperature source Other (Comment), resp. rate 16, height 5\' 6"  (1.676 m), weight 109.5 kg, SpO2 100 %.    Vent Mode: PRVC FiO2 (%):  [50  %-60 %] 50 % Set Rate:  [16 bmp-22 bmp] 16 bmp Vt Set:  [410 mL] 410 mL PEEP:  [10 cmH20] 10 cmH20 Plateau Pressure:  [19 cmH20-21 cmH20] 21 cmH20   Intake/Output Summary (Last 24 hours) at 09/09/2019 0851 Last data filed at 09/09/2019 0551 Gross per 24 hour  Intake 1958.94 ml  Output 1600 ml  Net 358.94 ml   Filed Weights   09/07/19 2127 09/08/19 0444  Weight: 110.2 kg 109.5 kg    Examination: General: elderly female lying in bed on vent, prone  HEENT: MM pink/moist, ETT Neuro: sedate CV: s1s2 RRR, no m/r/g PULM:  Non-labored, symmetrical air movement, no wheezing  GI: soft, bsx4 active  Extremities: warm/dry, no edema  Skin: no rashes or lesions  CXR 1/27 >> images personally reviewed, patchy right basilar opacity, ETT in good position  Resolved Hospital Problem list     Assessment & Plan:   Acute Hypoxemic Respiratory Failure secondary to COVID PNA / Sepsis  -low Vt ventilation 4-8cc/kg -goal plateau pressure <30, driving pressure 2/27 cm <07 -target PaO2 55-65, titrate PEEP/FiO2 per ARDS protocol  -if P/F ratio <150, consider prone therapy for 16 hours per day -goal CVP <4, diuresis as necessary -VAP prevention measures  -follow intermittent CXR  -decadron, remdesivir -follow LFT's   Sepsis secondary to Klebsiella UTI, COVID Infection  -follow inflammatory markers, cultures to maturity  -LR  at 52ml/hr, KVO in am 0600  -sensitivities reviewed, narrow abx to rocephin   Bipolar Disorder Followed by Psychiatry as outpatient -continue home seroquel, depakote  Acute Metabolic Encephalopathy  CT Head negative on admit. Question if her AMS was more related to Klebsiella UTI with sepsis as her CXR is not that impressive for COVID pneumonia with only RLL opacity & quick vent wean.   -follow neuro exam  -minimize sedation as able  -change to propofol from versed to allow for quicker wake up assessment in am -nearing point of being able to wean  AKI  Cr 1.76 on  transfer to [facility]. Unknown baseline. Now normalized.  -Trend BMP / urinary output -continue LR as above -Replace electrolytes as indicated -Avoid nephrotoxic agents, ensure adequate renal perfusion  Chronic AF -tele monitoring  -continue eliquis for anticoagulation  HTN -hold home agents   GERD -continue famotidine   DM II  Hgb A1c 9.5 -change SSI to moderate scale  -increase levemir to 7 units BID  -continue TF coverage 3 units Q4 -linagliptin PT  Pressure Injuries  Noted on admit -wound care per nursing  -pad all pressure areas / wound prevention measures   Best practice:  Diet: TF Pain/Anxiety/Delirium protocol (if indicated): per protocol VAP protocol (if indicated): per protocol DVT prophylaxis: apixaban GI prophylaxis: famotidine Glucose control: SSI Mobility: bedrest Code Status: FULL Family Communication: [name]) updated via phone 1/27 Disposition: ICU  Labs   CBC: Recent Labs  Lab 09/07/19 1000 09/07/19 1824 09/07/19 1950 09/07/19 2207 09/08/19 0951 09/08/19 2102 09/09/19 0445  WBC 7.7  --  7.7  --   --   --  9.2  NEUTROABS 4.5  --  5.5  --   --   --   --   HGB 13.8   < > 12.0 11.2* 11.2* 11.9* 11.6*  HCT 45.3   < > 38.8 33.0* 33.0* 35.0* 37.3  MCV 98.9  --  99.2  --   --   --  98.9  PLT 223  --  176  --   --   --  163   < > = values in this interval not displayed.    Basic Metabolic Panel: Recent Labs  Lab 09/07/19 1000 09/07/19 1824 09/07/19 1950 09/07/19 2207 09/08/19 0951 09/08/19 2102 09/09/19 0455  NA 145   < > 143 143 146* 141 143  K 5.0   < > 4.8 4.3 3.7 4.7 4.3  CL 99  --  101  --   --   --  104  CO2 35*  --  27  --   --   --  29  GLUCOSE 306*  --  395*  --   --   --  308*  BUN 58*  --  68*  --   --   --  63*  CREATININE 1.76*  --  1.87*  --   --   --  1.06*  CALCIUM 9.0  --  8.4*  --   --   --  8.1*  MG  --   --  2.2  --   --   --   --   PHOS  --   --  2.5  --   --   --   --    < > = values in this interval not  displayed.   GFR: Estimated Creatinine Clearance: 61 mL/min (A) (by C-G formula based on SCr of 1.06 mg/dL (H)). Recent Labs  Lab  09/07/19 1000 09/07/19 1404 09/07/19 1950 09/07/19 2325 09/08/19 1740 09/08/19 2315 09/09/19 0445  PROCALCITON 0.16  --  0.16  --   --   --   --   WBC 7.7  --  7.7  --   --   --  9.2  LATICACIDVEN 1.6   < > 2.0* 2.6* 3.3* 2.1*  --    < > = values in this interval not displayed.    Liver Function Tests: Recent Labs  Lab 09/07/19 1000 09/07/19 1950 09/09/19 0455  AST 39 40 36  ALT 34 29 31  ALKPHOS 38 31* 45  BILITOT 0.8 1.1 0.7  PROT 7.3 6.0* 5.5*  ALBUMIN 2.6* 2.2* 2.0*   No results for input(s): LIPASE, AMYLASE in the last 168 hours. No results for input(s): AMMONIA in the last 168 hours.  ABG    Component Value Date/Time   PHART 7.376 09/08/2019 2102   PCO2ART 53.9 (H) 09/08/2019 2102   PO2ART 70.0 (L) 09/08/2019 2102   HCO3 31.4 (H) 09/08/2019 2102   TCO2 33 (H) 09/08/2019 2102   O2SAT 92.0 09/08/2019 2102     Coagulation Profile: Recent Labs  Lab 09/07/19 1000 09/07/19 1950  INR 1.4* 1.6*    Cardiac Enzymes: No results for input(s): CKTOTAL, CKMB, CKMBINDEX, TROPONINI in the last 168 hours.  HbA1C: Hgb A1c MFr Bld  Date/Time Value Ref Range Status  09/07/2019 07:50 PM 9.5 (H) 4.8 - 5.6 % Final    Comment:    (NOTE) Pre diabetes:          5.7%-6.4% Diabetes:              >6.4% Glycemic control for   <7.0% adults with diabetes     CBG: Recent Labs  Lab 09/08/19 1553 09/08/19 2214 09/09/19 0052 09/09/19 0436 09/09/19 0733  GLUCAP 184* 324* 333* 273* 219*     Critical care time: 33 minutes    Noe Gens, MSN, NP-C [facility] 09/09/2019, 8:51 AM   Please see [email or web address] for pager details.

## 2019-09-10 DIAGNOSIS — L8990 Pressure ulcer of unspecified site, unspecified stage: Secondary | ICD-10-CM | POA: Insufficient documentation

## 2019-09-10 DIAGNOSIS — Z9289 Personal history of other medical treatment: Secondary | ICD-10-CM

## 2019-09-10 DIAGNOSIS — U071 COVID-19: Secondary | ICD-10-CM

## 2019-09-10 LAB — CBC
HCT: 35 % — ABNORMAL LOW (ref 36.0–46.0)
Hemoglobin: 10.7 g/dL — ABNORMAL LOW (ref 12.0–15.0)
MCH: 29.8 pg (ref 26.0–34.0)
MCHC: 30.6 g/dL (ref 30.0–36.0)
MCV: 97.5 fL (ref 80.0–100.0)
Platelets: 156 10*3/uL (ref 150–400)
RBC: 3.59 MIL/uL — ABNORMAL LOW (ref 3.87–5.11)
RDW: 14 % (ref 11.5–15.5)
WBC: 8 10*3/uL (ref 4.0–10.5)
nRBC: 0 % (ref 0.0–0.2)

## 2019-09-10 LAB — COMPREHENSIVE METABOLIC PANEL
ALT: 44 U/L (ref 0–44)
AST: 46 U/L — ABNORMAL HIGH (ref 15–41)
Albumin: 1.8 g/dL — ABNORMAL LOW (ref 3.5–5.0)
Alkaline Phosphatase: 129 U/L — ABNORMAL HIGH (ref 38–126)
Anion gap: 8 (ref 5–15)
BUN: 57 mg/dL — ABNORMAL HIGH (ref 8–23)
CO2: 29 mmol/L (ref 22–32)
Calcium: 8.2 mg/dL — ABNORMAL LOW (ref 8.9–10.3)
Chloride: 105 mmol/L (ref 98–111)
Creatinine, Ser: 0.77 mg/dL (ref 0.44–1.00)
GFR calc Af Amer: >60 mL/min (ref >60)
GFR calc non Af Amer: >60 mL/min (ref >60)
Glucose, Bld: 249 mg/dL — ABNORMAL HIGH (ref 70–99)
Potassium: 4.4 mmol/L (ref 3.5–5.1)
Sodium: 142 mmol/L (ref 135–145)
Total Bilirubin: 0.5 mg/dL (ref 0.3–1.2)
Total Protein: 5.3 g/dL — ABNORMAL LOW (ref 6.5–8.1)

## 2019-09-10 LAB — TRIGLYCERIDES: Triglycerides: 224 mg/dL — ABNORMAL HIGH (ref <150)

## 2019-09-10 LAB — GLUCOSE, CAPILLARY
Glucose-Capillary: 219 mg/dL — ABNORMAL HIGH (ref 70–99)
Glucose-Capillary: 233 mg/dL — ABNORMAL HIGH (ref 70–99)
Glucose-Capillary: 261 mg/dL — ABNORMAL HIGH (ref 70–99)
Glucose-Capillary: 301 mg/dL — ABNORMAL HIGH (ref 70–99)
Glucose-Capillary: 301 mg/dL — ABNORMAL HIGH (ref 70–99)
Glucose-Capillary: 328 mg/dL — ABNORMAL HIGH (ref 70–99)

## 2019-09-10 MED ORDER — JUVEN PO PACK
1.0000 | PACK | Freq: Two times a day (BID) | ORAL | Status: DC
  Administered 2019-09-10 – 2019-09-14 (×8): 1
  Filled 2019-09-10 (×8): qty 1

## 2019-09-10 MED ORDER — INSULIN ASPART 100 UNIT/ML ~~LOC~~ SOLN
5.0000 [IU] | SUBCUTANEOUS | Status: DC
  Administered 2019-09-10 – 2019-09-12 (×13): 5 [IU] via SUBCUTANEOUS

## 2019-09-10 MED ORDER — COLLAGENASE 250 UNIT/GM EX OINT
TOPICAL_OINTMENT | Freq: Every day | CUTANEOUS | Status: AC
  Filled 2019-09-10 (×2): qty 30

## 2019-09-10 MED ORDER — FUROSEMIDE 10 MG/ML IJ SOLN
40.0000 mg | Freq: Once | INTRAMUSCULAR | Status: AC
  Administered 2019-09-10: 40 mg via INTRAVENOUS
  Filled 2019-09-10: qty 4

## 2019-09-10 MED ORDER — INSULIN DETEMIR 100 UNIT/ML ~~LOC~~ SOLN
10.0000 [IU] | Freq: Two times a day (BID) | SUBCUTANEOUS | Status: DC
  Administered 2019-09-10 – 2019-09-11 (×3): 10 [IU] via SUBCUTANEOUS
  Filled 2019-09-10 (×3): qty 0.1

## 2019-09-10 MED FILL — Fentanyl Citrate Preservative Free (PF) Inj 2500 MCG/50ML: INTRAMUSCULAR | Qty: 50 | Status: AC

## 2019-09-10 MED FILL — Sodium Chloride IV Soln 0.9%: INTRAVENOUS | Qty: 1000 | Status: AC

## 2019-09-10 NOTE — Progress Notes (Signed)
NAME:  Becky Hendricks, MRN:  [id number], DOB:  May 20, 1946, LOS: 3 ADMISSION DATE:  09/07/2019, CONSULTATION DATE:  09/07/19 REFERRING MD:  [facility]  CHIEF COMPLAINT:  Acute hypoxemic resp failure  Brief History   74 y/o F who presented to [facility] on 1/25 with reports of AMS.  She was recently admitted to [facility] in December for AMS & C-Diff Colitis and discharged to a SNF in [place].  She quarantined for 2 weeks. After she was put into general population, she diagnosed with COVID on 1/17.  Prior to this, she was living independently.  After the COVID diagnosis, she become progressively worse with confusion, increased WOB and acutely AMS the 24 hours prior to admit.  She was intubated in the ER for airway protection and transferred to [facility] 1/25 PM.      Past Medical History  AF  Bipolar Disorder  DM II  Morbid Obesity  Clostridium Difficile Diarrhea - 08/02/2019.  Brother found her lying in stool at that time.   Significant Hospital Events   1/17 Positive for COVID  1/25 Admit to [facility] after intubation in [facility] ER, COVID + 1/28 Weaning on PSV  Consults:    Procedures:  ETT 1/25 >>   Significant Diagnostic Tests:  CT Head 1/25 >> atrophy with mild periventricular small vessel disease. No evident acute infarct. No mass or hemorrhage. There are foci of paranasal sinus disease at several sites.  Micro Data:  MRSA PCR 1/25 >> negative  BCx2 1/25 >> GPC's in aerobic bottle only >> coag negative staph (contaminant?) Tracheal aspirate 1/25 >>  UC 1/25 >> 100k Klebsiella >> sensitive ceftriaxone  Antimicrobials / COVID Rx:  Remdesivir 1/25 >> Cefepime 1/26 >> 1/26  Ceftriaxone 1/27 >>  Interim history/subjective:  Tmax 100.6 Glucose Range 222-328 FiO2 0.30, PEEP 5 I/O +1.2 L total, good diuresis 1/28 (2.2 L) No continuous sedation Tolerating some PSV    Objective   Blood pressure 117/61, pulse 67, temperature 99.7 F (37.6 C), resp. rate (!) 23, height 5\' 6"  (1.676 m), weight 109.5 kg, SpO2  99 %.    Vent Mode: CPAP;PSV FiO2 (%):  [40 %] 40 % Set Rate:  [16 bmp] 16 bmp Vt Set:  [410 mL] 410 mL PEEP:  [8 cmH20-10 cmH20] 8 cmH20 Pressure Support:  [8 cmH20] 8 cmH20 Plateau Pressure:  [16 cmH20-21 cmH20] 21 cmH20   Intake/Output Summary (Last 24 hours) at 09/10/2019 1302 Last data filed at 09/10/2019 1200 Gross per 24 hour  Intake 2887.5 ml  Output 2340 ml  Net 547.5 ml   Filed Weights   09/07/19 2127 09/08/19 0444  Weight: 110.2 kg 109.5 kg    Examination: General:  Obese ill appearing HEENT: MM pink/moist, ETT Neuro: Awake, eyes open, interacts CV: s1s2 RRR, ST, no m/r/g PULM:  Non-labored on PSV, coarse B GI: soft, bsx4 active  Extremities: warm/dry, trace edema  Skin: large sacral decubitus, dry / old scabbing on legs, back  Resolved Hospital Problem list     Assessment & Plan:   Acute Hypoxemic Respiratory Failure secondary to COVID PNA / Sepsis  Mechanical ventilation via ARDS protocol, target PRVC 6 cc/kg PEEP and FiO2 have been successfully weaned, ready for lighten sedation and transition to spontaneous breathing trials 1/28-1/29 Goal plateau pressure less than 30, driving pressure less than 15 Briefly required prone positioning at admission, not since Lighten sedation, RASS goal -1 Diuresis as blood pressure and renal function can tolerate, goal CVP 5-8.  Good diuresis on 1/28, plan  to repeat Lasix 1/29 VAP prevention order set  COVID-19 pneumonia Dexamethasone, plan 10 days Remdesivir, plan 5 days  Sepsis secondary to Klebsiella UTI (present on admit), COVID Infection.  Coag negative staph on 1 blood culture, suspect contaminant Ceftriaxone, plan 5 to 7 days   Bipolar Disorder Followed by psychiatry as an outpatient Home Seroquel, Depakote ordered.  May be labile on steroid  Acute Metabolic Encephalopathy  CT Head negative on admit.  Klebsiella UTI, complicated by Covid pneumonia, sedating medications RASS goal -1 Intermittent sedation  only  AKI  Cr 1.76 on transfer to [facility]. Unknown baseline. Now normalized.  Following urine output, BMP Avoid nephrotoxins, ensure adequate renal perfusion Plan to repeat Lasix on 1/29, follow response and renal function  Chronic AF Telemetry Anticoagulation with Eliquis  HTN Home antihypertension agents on hold  GERD Pepcid as ordered  DM II  Hgb A1c 9.5 Sliding-scale insulin moderate per protocol TF coverage 5 units every 4 hours Levemir 10 units twice daily > increase to 12 units twice daily on 1/29 Linagliptin  Pressure Injuries  Appreciate WOC assistance Wound care as per recommendations   Best practice:  Diet: TF Pain/Anxiety/Delirium protocol (if indicated): per protocol VAP protocol (if indicated): per protocol DVT prophylaxis: apixaban GI prophylaxis: famotidine Glucose control: SSI Mobility: bedrest Code Status: FULL Family Communication: Plan to call Brother [name]) on 1/29.  Disposition: ICU  Labs   CBC: Recent Labs  Lab 09/07/19 1000 09/07/19 1824 09/07/19 1950 09/07/19 2207 09/08/19 0951 09/08/19 2102 09/09/19 0445 09/09/19 2209 09/10/19 0509  WBC 7.7  --  7.7  --   --   --  9.2  --  8.0  NEUTROABS 4.5  --  5.5  --   --   --   --   --   --   HGB 13.8   < > 12.0   < > 11.2* 11.9* 11.6* 10.5* 10.7*  HCT 45.3   < > 38.8   < > 33.0* 35.0* 37.3 31.0* 35.0*  MCV 98.9  --  99.2  --   --   --  98.9  --  97.5  PLT 223  --  176  --   --   --  163  --  156   < > = values in this interval not displayed.    Basic Metabolic Panel: Recent Labs  Lab 09/07/19 1000 09/07/19 1824 09/07/19 1950 09/07/19 2207 09/08/19 0951 09/08/19 2102 09/09/19 0455 09/09/19 2209 09/10/19 0509  NA 145   < > 143   < > 146* 141 143 140 142  K 5.0   < > 4.8   < > 3.7 4.7 4.3 4.5 4.4  CL 99  --  101  --   --   --  104  --  105  CO2 35*  --  27  --   --   --  29  --  29  GLUCOSE 306*  --  395*  --   --   --  308*  --  249*  BUN 58*  --  68*  --   --   --  63*  --   57*  CREATININE 1.76*  --  1.87*  --   --   --  1.06*  --  0.77  CALCIUM 9.0  --  8.4*  --   --   --  8.1*  --  8.2*  MG  --   --  2.2  --   --   --   --   --   --  PHOS  --   --  2.5  --   --   --   --   --   --    < > = values in this interval not displayed.   GFR: Estimated Creatinine Clearance: 80.8 mL/min (by C-G formula based on SCr of 0.77 mg/dL). Recent Labs  Lab 09/07/19 1000 09/07/19 1404 09/07/19 1950 09/07/19 2325 09/08/19 1740 09/08/19 2315 09/09/19 0445 09/10/19 0509  PROCALCITON 0.16  --  0.16  --   --   --   --   --   WBC 7.7  --  7.7  --   --   --  9.2 8.0  LATICACIDVEN 1.6   < > 2.0* 2.6* 3.3* 2.1*  --   --    < > = values in this interval not displayed.    Liver Function Tests: Recent Labs  Lab 09/07/19 1000 09/07/19 1950 09/09/19 0455 09/10/19 0509  AST 39 40 36 46*  ALT 34 29 31 44  ALKPHOS 38 31* 45 129*  BILITOT 0.8 1.1 0.7 0.5  PROT 7.3 6.0* 5.5* 5.3*  ALBUMIN 2.6* 2.2* 2.0* 1.8*   No results for input(s): LIPASE, AMYLASE in the last 168 hours. No results for input(s): AMMONIA in the last 168 hours.  ABG    Component Value Date/Time   PHART 7.435 09/09/2019 2209   PCO2ART 45.5 09/09/2019 2209   PO2ART 96.0 09/09/2019 2209   HCO3 30.6 (H) 09/09/2019 2209   TCO2 32 09/09/2019 2209   O2SAT 98.0 09/09/2019 2209     Coagulation Profile: Recent Labs  Lab 09/07/19 1000 09/07/19 1950  INR 1.4* 1.6*    Cardiac Enzymes: No results for input(s): CKTOTAL, CKMB, CKMBINDEX, TROPONINI in the last 168 hours.  HbA1C: Hgb A1c MFr Bld  Date/Time Value Ref Range Status  09/07/2019 07:50 PM 9.5 (H) 4.8 - 5.6 % Final    Comment:    (NOTE) Pre diabetes:          5.7%-6.4% Diabetes:              >6.4% Glycemic control for   <7.0% adults with diabetes     CBG: Recent Labs  Lab 09/09/19 2004 09/09/19 2345 09/10/19 0511 09/10/19 0733 09/10/19 1122  GLUCAP 302* 259* 219* 233* 261*     Critical care time: 33 minutes     Levy Pupa, MD, PhD 09/11/2019, 10:28 AM [facility] [phone] or if no answer [phone]

## 2019-09-10 NOTE — Progress Notes (Signed)
NAME:  Becky Hendricks, MRN:  [id number], DOB:  1945-10-11, LOS: 3 ADMISSION DATE:  09/07/2019, CONSULTATION DATE:  09/07/19 REFERRING MD:  [facility]  CHIEF COMPLAINT:  Acute hypoxemic resp failure  Brief History   74 y/o F who presented to [facility] on 1/25 with reports of AMS.  She was recently admitted to [facility] in December for AMS & C-Diff Colitis and discharged to a SNF in [place].  She quarantined for 2 weeks. After she was put into general population, she diagnosed with COVID on 1/17.  Prior to this, she was living independently.  After the COVID diagnosis, she become progressively worse with confusion, increased WOB and acutely AMS the 24 hours prior to admit.  She was intubated in the ER for airway protection and transferred to [facility] 1/25 PM.      Past Medical History  AF  Bipolar Disorder  DM II  Morbid Obesity  Clostridium Difficile Diarrhea - 08/02/2019.  Brother found her lying in stool at that time.   Significant Hospital Events   1/17 Positive for COVID  1/25 Admit to [facility] after intubation in [facility] ER, COVID + 1/28 Weaning on PSV  Consults:    Procedures:  ETT 1/25 >>   Significant Diagnostic Tests:  CT Head 1/25 >> atrophy with mild periventricular small vessel disease. No evident acute infarct. No mass or hemorrhage. There are foci of paranasal sinus disease at several sites.  Micro Data:  MRSA PCR 1/25 >> negative  BCx2 1/25 >> GPC's in aerobic bottle only >>  Tracheal aspirate 1/25 >>  UC 1/25 >> 100k Klebsiella >> sensitive ceftriaxone  Antimicrobials / COVID Rx:  Remdesivir 1/25 >> Cefepime 1/26 >> 1/26  Ceftriaxone 1/27 >>  Interim history/subjective:  Tmax 99.7 Glucose Range 219-260 PEEP 8, FiO2 40%  I/O 2.1L UOP, 1.5L+ in 24 hours RN reports sedation lightened, pt weaning on PSV  Objective   Blood pressure (!) 90/50, pulse (!) 50, temperature 99.7 F (37.6 C), resp. rate 16, height 5\' 6"  (1.676 m), weight 109.5 kg, SpO2 100 %.    Vent Mode: PRVC FiO2 (%):  [40  %-50 %] 40 % Set Rate:  [16 bmp] 16 bmp Vt Set:  [410 mL] 410 mL PEEP:  [8 cmH20-10 cmH20] 8 cmH20 Plateau Pressure:  [16 cmH20-20 cmH20] 16 cmH20   Intake/Output Summary (Last 24 hours) at 09/10/2019 0820 Last data filed at 09/10/2019 0600 Gross per 24 hour  Intake 3733.14 ml  Output 2195 ml  Net 1538.14 ml   Filed Weights   09/07/19 2127 09/08/19 0444  Weight: 110.2 kg 109.5 kg    Examination: General: adult female lying in bed on vent in NAD  HEENT: MM pink/moist, ETT, mild crusting on lashes Neuro: sedate, opens eyes to voice CV: s1s2 RRR, no m/r/g PULM:  Non-labored, lungs bilaterally clear anterior GI: soft, bsx4 active  Extremities: warm/dry, trace LE edema  Skin: multiple areas of scabbing, dried / old rash (appears as if patient picks), large sacral decubitus with areas of dark tissue  Resolved Hospital Problem list     Assessment & Plan:   Acute Hypoxemic Respiratory Failure secondary to COVID PNA / Sepsis  -low Vt ventilation 4-8cc/kg -Daily SBT as tolerated  -goal plateau pressure <30, driving pressure <67 cm H2O -target PaO2 55-65, titrate PEEP/FiO2 per ARDS protocol  -goal CVP <4, diuresis as necessary -VAP prevention measures  -follow intermittent CXR  -decadron, remdesivir  Sepsis secondary to Klebsiella UTI (present on admit), COVID Infection  -continue  rocephin as above  -KVO IVF  Bipolar Disorder Followed by Psychiatry as outpatient -continue seroquel, depakote (Home meds)  Acute Metabolic Encephalopathy  CT Head negative on admit. Question if her AMS was more related to Klebsiella UTI with sepsis as her CXR is not that impressive for COVID pneumonia with only RLL opacity & quick vent wean.   -minimize sedation  -RASS Goal -1 to -2 -continue propofol for now, goal to allow for WUA  -delirium prevention measures   AKI  Cr 1.76 on transfer to [facility]. Unknown baseline. Now normalized.  -Trend BMP / urinary output -lasix 40 mg IV x1 -Replace  electrolytes as indicated -Avoid nephrotoxic agents, ensure adequate renal perfusion  Chronic AF -tele monitoring  -continue eliquis for anticoagulation   HTN -hold home agents  GERD -famotidine   DM II  Hgb A1c 9.5 -SSI, moderate scale  -increase levemir to 10 units BID  -increase TF coverage to 5 units Q5  -continue linagliptin   Pressure Injuries  Noted on admit -wound care per nursing -wound prevention measures  -WOC following, appreciate evaluation   Best practice:  Diet: TF Pain/Anxiety/Delirium protocol (if indicated): per protocol VAP protocol (if indicated): per protocol DVT prophylaxis: apixaban GI prophylaxis: famotidine Glucose control: SSI Mobility: bedrest Code Status: FULL Family Communication: Brother [name]) updated via phone 1/28 Disposition: ICU  Labs   CBC: Recent Labs  Lab 09/07/19 1000 09/07/19 1824 09/07/19 1950 09/07/19 2207 09/08/19 0951 09/08/19 2102 09/09/19 0445 09/09/19 2209 09/10/19 0509  WBC 7.7  --  7.7  --   --   --  9.2  --  8.0  NEUTROABS 4.5  --  5.5  --   --   --   --   --   --   HGB 13.8   < > 12.0   < > 11.2* 11.9* 11.6* 10.5* 10.7*  HCT 45.3   < > 38.8   < > 33.0* 35.0* 37.3 31.0* 35.0*  MCV 98.9  --  99.2  --   --   --  98.9  --  97.5  PLT 223  --  176  --   --   --  163  --  156   < > = values in this interval not displayed.    Basic Metabolic Panel: Recent Labs  Lab 09/07/19 1000 09/07/19 1824 09/07/19 1950 09/07/19 2207 09/08/19 0951 09/08/19 2102 09/09/19 0455 09/09/19 2209 09/10/19 0509  NA 145   < > 143   < > 146* 141 143 140 142  K 5.0   < > 4.8   < > 3.7 4.7 4.3 4.5 4.4  CL 99  --  101  --   --   --  104  --  105  CO2 35*  --  27  --   --   --  29  --  29  GLUCOSE 306*  --  395*  --   --   --  308*  --  249*  BUN 58*  --  68*  --   --   --  63*  --  57*  CREATININE 1.76*  --  1.87*  --   --   --  1.06*  --  0.77  CALCIUM 9.0  --  8.4*  --   --   --  8.1*  --  8.2*  MG  --   --  2.2  --    --   --   --   --   --  PHOS  --   --  2.5  --   --   --   --   --   --    < > = values in this interval not displayed.   GFR: Estimated Creatinine Clearance: 80.8 mL/min (by C-G formula based on SCr of 0.77 mg/dL). Recent Labs  Lab 09/07/19 1000 09/07/19 1404 09/07/19 1950 09/07/19 2325 09/08/19 1740 09/08/19 2315 09/09/19 0445 09/10/19 0509  PROCALCITON 0.16  --  0.16  --   --   --   --   --   WBC 7.7  --  7.7  --   --   --  9.2 8.0  LATICACIDVEN 1.6   < > 2.0* 2.6* 3.3* 2.1*  --   --    < > = values in this interval not displayed.    Liver Function Tests: Recent Labs  Lab 09/07/19 1000 09/07/19 1950 09/09/19 0455 09/10/19 0509  AST 39 40 36 46*  ALT 34 29 31 44  ALKPHOS 38 31* 45 129*  BILITOT 0.8 1.1 0.7 0.5  PROT 7.3 6.0* 5.5* 5.3*  ALBUMIN 2.6* 2.2* 2.0* 1.8*   No results for input(s): LIPASE, AMYLASE in the last 168 hours. No results for input(s): AMMONIA in the last 168 hours.  ABG    Component Value Date/Time   PHART 7.435 09/09/2019 2209   PCO2ART 45.5 09/09/2019 2209   PO2ART 96.0 09/09/2019 2209   HCO3 30.6 (H) 09/09/2019 2209   TCO2 32 09/09/2019 2209   O2SAT 98.0 09/09/2019 2209     Coagulation Profile: Recent Labs  Lab 09/07/19 1000 09/07/19 1950  INR 1.4* 1.6*    Cardiac Enzymes: No results for input(s): CKTOTAL, CKMB, CKMBINDEX, TROPONINI in the last 168 hours.  HbA1C: Hgb A1c MFr Bld  Date/Time Value Ref Range Status  09/07/2019 07:50 PM 9.5 (H) 4.8 - 5.6 % Final    Comment:    (NOTE) Pre diabetes:          5.7%-6.4% Diabetes:              >6.4% Glycemic control for   <7.0% adults with diabetes     CBG: Recent Labs  Lab 09/09/19 1627 09/09/19 2004 09/09/19 2345 09/10/19 0511 09/10/19 0733  GLUCAP 275* 302* 259* 219* 233*     Critical care time: 32 minutes    Canary Brim, MSN, NP-C [place] [facility] 09/10/2019, 8:20 AM   Please see [email or web address] for pager details.

## 2019-09-10 NOTE — Consult Note (Signed)
WOC Nurse wound consult note Consultation completed by use of remote telehealth camera cart/Elink technology and with assistance from bedside nurse/clinical staff  Reason for Consult: pressure injury Wound type:  1. Unstageable Pressure Injury; sacrum/right buttock  2. Stage 3 Pressure Injury;left knee 3. Stage 2 Pressure Injury;right knee  Pressure Injury POA: Yes Measurement: see nursing flowsheet; remote Elink consult and agree with measurements  Wound bed: Sacrum: 75% black/25% dark purple non blanchable  Left knee:pink, clean Right knee: pink, clean Drainage (amount, consistency, odor) minimal  Periwound: intact  Dressing procedure/placement/frequency: 1. Add enzymatic debridement ointment to sacral wound 2. Consider hydrotherapy if available - I have contacted PT department 3. Patient currently on LALM for pressure redistribution and moisture management 4. Add RD for nutritional supplementation for wound healing 5. Silicone foam per skin care order set for bilateral knee wounds 6. Notified CCM team of POC and level of agressiveness for care for the sacral wound  WOC Nurse team will follow with you and see patient within 10 days for wound assessments.  Please notify WOC nurses of any acute changes in the wounds or any new areas of concern Jailyne Chieffo [facility] MSN, RN,CWOCN, CNS, CWON-AP [phone]

## 2019-09-10 NOTE — Plan of Care (Signed)
Patient remained supine throughout the shift. She is still on the vent and has been on pressure support since this morning. No S/S pain. Sedation has been turned off since this morning.   Brother, [name], was updated on the phone this afternoon. All questions were answered.   Problem: Education: Goal: Knowledge of risk factors and measures for prevention of condition will improve Outcome: Progressing   Problem: Coping: Goal: Psychosocial and spiritual needs will be supported Outcome: Progressing   Problem: Respiratory: Goal: Will maintain a patent airway Outcome: Progressing Goal: Complications related to the disease process, condition or treatment will be avoided or minimized Outcome: Progressing

## 2019-09-10 NOTE — Progress Notes (Signed)
Nutrition Follow-up / Consult  DOCUMENTATION CODES:   Obesity unspecified  INTERVENTION:    Continue Vital AF 1.2 at 40 ml/h (960 ml per day)   Continue Pro-stat 30 ml TID   Provides 1452 kcal, 117 gm protein, 779 ml free water daily   Add Juven BID, each packet provides 80 calories, 8 grams of carbohydrate, 2.5  grams of protein (collagen), 7 grams of L-arginine and 7 grams of L-glutamine; supplement contains CaHMB, Vitamins C, E, B12 and Zinc to promote wound healing   NUTRITION DIAGNOSIS:   Increased nutrient needs related to acute illness, catabolic illness(COVID-19) as evidenced by estimated needs.  GOAL:   Patient will meet greater than or equal to 90% of their needs  MONITOR:   Vent status, TF tolerance, Labs, Skin  REASON FOR ASSESSMENT:   Ventilator, Consult Enteral/tube feeding initiation and management  ASSESSMENT:   74 yo female admitted from SNF with AMS and required intubation. Diagnosed with COVID-19 on 1/17. PMH includes A fib, C. Diff colitis 08/02/2019, Bipolar D/O, morbid obesity, HTN, DM-2.   OG tube in place; receiving Vital AF 1.2 at 40 ml/h with Pro-stat 30 ml TID to provide 1452 kcal, 117 gm protein, 779 ml free water daily.  Received wound care consult for large wound to right buttock. Patient is currently tolerating TF well to meet kcal and protein needs. Will add Juven BID via tube to provide arginine, glutamine, collagen to help support wound healing.   Patient remains intubated on ventilator support.  MV: 6.8 L/min Temp (24hrs), Avg:99.7 F (37.6 C), Min:99.3 F (37.4 C), Max:100.2 F (37.9 C)    Proning continues. Patient was placed in supine position last night.  Flexiseal placed last night due to large sacral wound and liquid BMs.   Labs reviewed. Triglycerides 224 (H) CBG's: [phone]  Medications reviewed and include decadron, novolog SSI and novolog 5 U every 4 hours, levemir, tradjenta, remdesivir.    Diet Order:    Diet Order            Diet NPO time specified  Diet effective now              EDUCATION NEEDS:   Not appropriate for education at this time  Skin:   Stage II: R knee Stage III: L knee Unstageable: sacrum / R buttock  Last BM:  1/28 type 7 rectal tube   Height:   Ht Readings from Last 1 Encounters:  09/07/19 5\' 6"  (1.676 m)    Weight:   Wt Readings from Last 1 Encounters:  09/08/19 109.5 kg   Admission weight 110.2 kg (1/25)   Ideal Body Weight:  59.1 kg  BMI:  Body mass index is 38.96 kg/m.  Estimated Nutritional Needs:   Kcal:  1440-1620  Protein:  >/= 118 gm  Fluid:  >/= 1.5 L    06-23-1969, RD, LDN, CNSC Pager [phone] After Hours Pager [phone]

## 2019-09-11 ENCOUNTER — Other Ambulatory Visit: Payer: Self-pay

## 2019-09-11 ENCOUNTER — Encounter (HOSPITAL_COMMUNITY): Payer: Self-pay | Admitting: Critical Care Medicine

## 2019-09-11 DIAGNOSIS — J80 Acute respiratory distress syndrome: Secondary | ICD-10-CM

## 2019-09-11 LAB — COMPREHENSIVE METABOLIC PANEL
ALT: 38 U/L (ref 0–44)
AST: 26 U/L (ref 15–41)
Albumin: 1.9 g/dL — ABNORMAL LOW (ref 3.5–5.0)
Alkaline Phosphatase: 143 U/L — ABNORMAL HIGH (ref 38–126)
Anion gap: 9 (ref 5–15)
BUN: 56 mg/dL — ABNORMAL HIGH (ref 8–23)
CO2: 32 mmol/L (ref 22–32)
Calcium: 8.5 mg/dL — ABNORMAL LOW (ref 8.9–10.3)
Chloride: 99 mmol/L (ref 98–111)
Creatinine, Ser: 0.76 mg/dL (ref 0.44–1.00)
GFR calc Af Amer: >60 mL/min (ref >60)
GFR calc non Af Amer: >60 mL/min (ref >60)
Glucose, Bld: 318 mg/dL — ABNORMAL HIGH (ref 70–99)
Potassium: 4.2 mmol/L (ref 3.5–5.1)
Sodium: 140 mmol/L (ref 135–145)
Total Bilirubin: 0.3 mg/dL (ref 0.3–1.2)
Total Protein: 5.6 g/dL — ABNORMAL LOW (ref 6.5–8.1)

## 2019-09-11 LAB — CBC
HCT: 37.2 % (ref 36.0–46.0)
Hemoglobin: 11.7 g/dL — ABNORMAL LOW (ref 12.0–15.0)
MCH: 30.2 pg (ref 26.0–34.0)
MCHC: 31.5 g/dL (ref 30.0–36.0)
MCV: 95.9 fL (ref 80.0–100.0)
Platelets: 154 10*3/uL (ref 150–400)
RBC: 3.88 MIL/uL (ref 3.87–5.11)
RDW: 13.9 % (ref 11.5–15.5)
WBC: 10.2 10*3/uL (ref 4.0–10.5)
nRBC: 0 % (ref 0.0–0.2)

## 2019-09-11 LAB — CULTURE, BLOOD (ROUTINE X 2)

## 2019-09-11 LAB — GLUCOSE, CAPILLARY
Glucose-Capillary: 235 mg/dL — ABNORMAL HIGH (ref 70–99)
Glucose-Capillary: 222 mg/dL — ABNORMAL HIGH (ref 70–99)
Glucose-Capillary: 275 mg/dL — ABNORMAL HIGH (ref 70–99)
Glucose-Capillary: 361 mg/dL — ABNORMAL HIGH (ref 70–99)
Glucose-Capillary: 310 mg/dL — ABNORMAL HIGH (ref 70–99)
Glucose-Capillary: 315 mg/dL — ABNORMAL HIGH (ref 70–99)

## 2019-09-11 LAB — TRIGLYCERIDES: Triglycerides: 203 mg/dL — ABNORMAL HIGH (ref <150)

## 2019-09-11 MED ORDER — FUROSEMIDE 10 MG/ML IJ SOLN
40.0000 mg | Freq: Once | INTRAMUSCULAR | Status: AC
Start: 2019-09-11 — End: 2019-09-11
  Administered 2019-09-11: 40 mg via INTRAVENOUS
  Filled 2019-09-11: qty 4

## 2019-09-11 MED ORDER — FENTANYL CITRATE (PF) 100 MCG/2ML IJ SOLN
25.0000 ug | INTRAMUSCULAR | Status: DC | PRN
  Administered 2019-09-11 – 2019-09-12 (×3): 50 ug via INTRAVENOUS
  Administered 2019-09-13 (×2): 25 ug via INTRAVENOUS
  Filled 2019-09-11 (×5): qty 2

## 2019-09-11 MED ORDER — INSULIN DETEMIR 100 UNIT/ML ~~LOC~~ SOLN
12.0000 [IU] | Freq: Two times a day (BID) | SUBCUTANEOUS | Status: DC
  Administered 2019-09-11 – 2019-09-12 (×2): 12 [IU] via SUBCUTANEOUS
  Filled 2019-09-11 (×3): qty 0.12

## 2019-09-11 NOTE — Progress Notes (Signed)
Patients brother [name]) called for update 1/29.  Reviewed vent weaning / nearing point of possible extubation soon.   Canary Brim, MSN, NP-C [facility] 09/11/2019, 4:23 PM   Please see [email or web address] for pager details.

## 2019-09-11 NOTE — Progress Notes (Signed)
Pt placed back on PRVC, Fentanyl given and 12 lead EKG obtained. Elink notified and watching closely. BP Q15 min and stable.

## 2019-09-12 ENCOUNTER — Encounter (HOSPITAL_COMMUNITY): Payer: Self-pay

## 2019-09-12 ENCOUNTER — Inpatient Hospital Stay (HOSPITAL_COMMUNITY): Payer: Medicare Other

## 2019-09-12 LAB — COMPREHENSIVE METABOLIC PANEL
ALT: 31 U/L (ref 0–44)
AST: 18 U/L (ref 15–41)
Albumin: 1.9 g/dL — ABNORMAL LOW (ref 3.5–5.0)
Alkaline Phosphatase: 132 U/L — ABNORMAL HIGH (ref 38–126)
Anion gap: 12 (ref 5–15)
BUN: 53 mg/dL — ABNORMAL HIGH (ref 8–23)
CO2: 32 mmol/L (ref 22–32)
Calcium: 8.7 mg/dL — ABNORMAL LOW (ref 8.9–10.3)
Chloride: 97 mmol/L — ABNORMAL LOW (ref 98–111)
Creatinine, Ser: 0.7 mg/dL (ref 0.44–1.00)
GFR calc Af Amer: >60 mL/min (ref >60)
GFR calc non Af Amer: >60 mL/min (ref >60)
Glucose, Bld: 328 mg/dL — ABNORMAL HIGH (ref 70–99)
Potassium: 3.9 mmol/L (ref 3.5–5.1)
Sodium: 141 mmol/L (ref 135–145)
Total Bilirubin: 0.4 mg/dL (ref 0.3–1.2)
Total Protein: 5.7 g/dL — ABNORMAL LOW (ref 6.5–8.1)

## 2019-09-12 LAB — CBC
HCT: 36.7 % (ref 36.0–46.0)
Hemoglobin: 11.5 g/dL — ABNORMAL LOW (ref 12.0–15.0)
MCH: 29.8 pg (ref 26.0–34.0)
MCHC: 31.3 g/dL (ref 30.0–36.0)
MCV: 95.1 fL (ref 80.0–100.0)
Platelets: 176 10*3/uL (ref 150–400)
RBC: 3.86 MIL/uL — ABNORMAL LOW (ref 3.87–5.11)
RDW: 13.7 % (ref 11.5–15.5)
WBC: 13.1 10*3/uL — ABNORMAL HIGH (ref 4.0–10.5)
nRBC: 0 % (ref 0.0–0.2)

## 2019-09-12 LAB — GLUCOSE, CAPILLARY
Glucose-Capillary: 272 mg/dL — ABNORMAL HIGH (ref 70–99)
Glucose-Capillary: 201 mg/dL — ABNORMAL HIGH (ref 70–99)
Glucose-Capillary: 342 mg/dL — ABNORMAL HIGH (ref 70–99)
Glucose-Capillary: 323 mg/dL — ABNORMAL HIGH (ref 70–99)
Glucose-Capillary: 332 mg/dL — ABNORMAL HIGH (ref 70–99)

## 2019-09-12 LAB — CULTURE, BLOOD (ROUTINE X 2)
Culture: NO GROWTH
Culture: NO GROWTH
Special Requests: ADEQUATE
Special Requests: ADEQUATE

## 2019-09-12 LAB — TRIGLYCERIDES: Triglycerides: 157 mg/dL — ABNORMAL HIGH (ref <150)

## 2019-09-12 MED ORDER — INSULIN ASPART 100 UNIT/ML ~~LOC~~ SOLN
8.0000 [IU] | SUBCUTANEOUS | Status: DC
  Administered 2019-09-12 – 2019-09-14 (×12): 8 [IU] via SUBCUTANEOUS

## 2019-09-12 MED ORDER — INSULIN ASPART 100 UNIT/ML ~~LOC~~ SOLN
0.0000 [IU] | SUBCUTANEOUS | Status: DC
  Administered 2019-09-12 (×2): 15 [IU] via SUBCUTANEOUS
  Administered 2019-09-13: 4 [IU] via SUBCUTANEOUS
  Administered 2019-09-13: 20:00:00 15 [IU] via SUBCUTANEOUS
  Administered 2019-09-13: 11 [IU] via SUBCUTANEOUS
  Administered 2019-09-13: 7 [IU] via SUBCUTANEOUS
  Administered 2019-09-13: 11 [IU] via SUBCUTANEOUS
  Administered 2019-09-13 (×2): 7 [IU] via SUBCUTANEOUS
  Administered 2019-09-14 (×2): 15 [IU] via SUBCUTANEOUS
  Administered 2019-09-14: 3 [IU] via SUBCUTANEOUS
  Administered 2019-09-14: 4 [IU] via SUBCUTANEOUS
  Administered 2019-09-14 – 2019-09-15 (×2): 7 [IU] via SUBCUTANEOUS
  Administered 2019-09-15: 3 [IU] via SUBCUTANEOUS
  Administered 2019-09-15: 7 [IU] via SUBCUTANEOUS
  Administered 2019-09-15: 4 [IU] via SUBCUTANEOUS

## 2019-09-12 MED ORDER — VANCOMYCIN HCL 2000 MG/400ML IV SOLN
2000.0000 mg | Freq: Once | INTRAVENOUS | Status: AC
  Administered 2019-09-12: 2000 mg via INTRAVENOUS
  Filled 2019-09-12: qty 400

## 2019-09-12 MED ORDER — FUROSEMIDE 10 MG/ML IJ SOLN
40.0000 mg | Freq: Once | INTRAMUSCULAR | Status: AC
  Administered 2019-09-12: 40 mg via INTRAVENOUS
  Filled 2019-09-12: qty 4

## 2019-09-12 MED ORDER — INSULIN DETEMIR 100 UNIT/ML ~~LOC~~ SOLN
20.0000 [IU] | Freq: Two times a day (BID) | SUBCUTANEOUS | Status: DC
  Administered 2019-09-12 – 2019-09-13 (×3): 20 [IU] via SUBCUTANEOUS
  Filled 2019-09-12 (×4): qty 0.2

## 2019-09-12 MED ORDER — VANCOMYCIN HCL 1500 MG/300ML IV SOLN
1500.0000 mg | INTRAVENOUS | Status: DC
  Administered 2019-09-13 – 2019-09-14 (×2): 1500 mg via INTRAVENOUS
  Filled 2019-09-12 (×3): qty 300

## 2019-09-12 NOTE — Progress Notes (Signed)
Patient Tmax today 101.1 with bladder temp prob.  Verified with axillary reading.  MD notified.  Blood, urine and sputum cultures ordered.  Vancomycin to start once cultures collected.

## 2019-09-12 NOTE — Progress Notes (Signed)
Pt lavaged & sxn'd with 5cc normal saline.

## 2019-09-12 NOTE — Progress Notes (Signed)
Pharmacy Antibiotic Note  Becky Hendricks is a 74 y.o. female admitted on 09/07/2019 with AMS and required intubation, diagnosed with COVID on 08/30/19 at SNF.  She has received remdesivir and has been on ceftriaxone for Klebsiella UTI.  Pharmacy has been consulted for vancomycin dosing for sacral wound.  Plan: - Vancomycin 2g IV x 1, then 1500mg  IV q24h for estimated AUC 496 (goal 400-550) - Follow up renal function & cultures  Height: 5\' 6"  (167.6 cm) Weight: 244 lb 4.3 oz (110.8 kg) IBW/kg (Calculated) : 59.3  Temp (24hrs), Avg:100 F (37.8 C), Min:99.5 F (37.5 C), Max:101.1 F (38.4 C)  Recent Labs  Lab 09/07/19 1000 09/07/19 1404 09/07/19 1950 09/07/19 2325 09/08/19 1740 09/08/19 2315 09/09/19 0445 09/09/19 0455 09/10/19 0509 09/11/19 0135 09/12/19 0115  WBC   < >  --  7.7  --   --   --  9.2  --  8.0 10.2 13.1*  CREATININE   < >  --  1.87*  --   --   --   --  1.06* 0.77 0.76 0.70  LATICACIDVEN  --  1.8 2.0* 2.6* 3.3* 2.1*  --   --   --   --   --    < > = values in this interval not displayed.    Estimated Creatinine Clearance: 81.4 mL/min (by C-G formula based on SCr of 0.7 mg/dL).    Not on File  Antimicrobials this admission: 1/25 Cefepime >> 1/27 1/26 Remdesivir >> 1/30 1/27 Ceftriaxone >> 1/30 1/30 Vancomycin >>  Dose adjustments this admission:  Microbiology results: 1/25 BCx: ngtd 1/25 UCx: >100k Klebsiella (pan-sens except amp) 1/25 MRSA PCR: neg 1/25 Resp panel: SARS CoV2 positive, Influenza A/B negative 1/26 BCx 1/2 bottles: CoNS (other bottle NGTD) 1/30 BCx: 1/30 UCx: 1/30 Trach asp:  Thank you for allowing pharmacy to be a part of this patient's care.  2/30, PharmD, BCPS Pharmacy: [phone] 09/12/2019 5:40 PM

## 2019-09-12 NOTE — Progress Notes (Signed)
NAME:  Becky Hendricks, MRN:  [id number], DOB:  1946/01/03, LOS: 5 ADMISSION DATE:  09/07/2019, CONSULTATION DATE:  09/07/19 REFERRING MD:  [facility]  CHIEF COMPLAINT:  Acute hypoxemic resp failure  Brief History   74 y/o F who presented to [facility] on 1/25 with reports of AMS.  She was recently admitted to [facility] in December for AMS & C-Diff Colitis and discharged to a SNF in [place].  Prior to this, she was living independently. she was diagnosed with COVID on 1/17. she become progressively worse with confusion, increased WOB and acutely AMS the 24 hours prior to admit.  She was intubated in the ER for airway protection and transferred to [facility] 1/25 .      Past Medical History  AF  Bipolar Disorder  DM II  Morbid Obesity  Clostridium Difficile Diarrhea - 08/02/2019.  Brother found her lying in stool at that time.   Significant Hospital Events   1/17 Positive for COVID  1/25 Admit to [facility] after intubation in [facility] ER, COVID + 1/28 Weaning on PSV  Consults:    Procedures:  ETT 1/25 >>   Significant Diagnostic Tests:  CT Head 1/25 >> atrophy with mild periventricular small vessel disease. No evident acute infarct. No mass or hemorrhage. There are foci of paranasal sinus disease at several sites.  Micro Data:  MRSA PCR 1/25 >> negative  BCx2 1/25 >> 1/2 coag negative staph (contaminant?) Tracheal aspirate 1/25 >>  UC 1/25 >> 100k Klebsiella >> sensitive ceftriaxone  Antimicrobials / COVID Rx:  Remdesivir 1/25 >>1/29 Cefepime 1/26 >> 1/26  Ceftriaxone 1/27 >>  Interim history/subjective:   Critically ill, intubated Desaturation on PEEP of 5, increased to 8 Excellent urine output Febrile 101  Objective   Blood pressure 127/64, pulse 97, temperature (!) 100.9 F (38.3 C), temperature source Bladder, resp. rate (!) 21, height 5\' 6"  (1.676 m), weight 110.8 kg, SpO2 93 %.    Vent Mode: PRVC FiO2 (%):  [30 %-60 %] 60 % Set Rate:  [16 bmp] 16 bmp Vt Set:  [410 mL] 410 mL PEEP:  [5 cmH20-8  cmH20] 8 cmH20 Pressure Support:  [10 cmH20-12 cmH20] 10 cmH20 Plateau Pressure:  [17 cmH20-20 cmH20] 20 cmH20   Intake/Output Summary (Last 24 hours) at 09/12/2019 1500 Last data filed at 09/12/2019 1243 Gross per 24 hour  Intake 1186.95 ml  Output 4110 ml  Net -2923.05 ml   Filed Weights   09/07/19 2127 09/08/19 0444 09/12/19 0500  Weight: 110.2 kg 109.5 kg 110.8 kg    Examination: General:  Obese, elderly, acutely ill appearing HEENT: MM pink/moist, ETT oral Neuro: Awake, eyes open, interacts CV: s1s2 RRR, ST, no m/r/g PULM: Coarse bilateral ventilated breath sounds GI: soft, bsx4 active  Extremities: warm/dry, trace edema  Skin: large sacral decubitus, dry / old scabbing on legs, back  Chest x-ray 1/30 personally reviewed which shows worsening atelectasis left lung base.  Labs show normal electrolytes, elevated glucose, albumin 1.9, mild leukocytosis, stable anemia  Resolved Hospital Problem list     Assessment & Plan:   Acute Hypoxemic Respiratory Failure secondary to COVID PNA  Mechanical ventilation via ARDS protocol, target PRVC 6 -8cc/kg PEEP increased to 8 this morning for desaturation Goal plateau pressure less than 30, driving pressure less than 15 Briefly required prone positioning at admission, not since  Diuresis as blood pressure and renal function can tolerate, goal CVP 5-8.   VAP prevention order set  COVID-19 pneumonia Dexamethasone, plan 10 days Remdesivir completed  Sepsis secondary to Klebsiella UTI (present on admit), COVID Infection.  Coag negative staph on 1 blood culture, suspect contaminant Ceftriaxone, plan 5 to 7 days   Acute Metabolic Encephalopathy  Bipolar Disorder Followed by psychiatry as an outpatient CT Head negative on admit.  Klebsiella UTI, complicated by Covid pneumonia, sedating medications RASS goal -1 Intermittent sedation only Ct Home Seroquel, Depakote     AKI -resolved Following urine output, BMP Avoid  nephrotoxins, ensure adequate renal perfusion Lasix 40 IV daily  Chronic AF Telemetry Anticoagulation with Eliquis  HTN Home antihypertension agents on hold   DM II -uncontrolled hyperglycemia due to steroids Hgb A1c 9.5 Sliding-scale insulin moderate per protocol TF coverage 5 units every 4 hours Levemir increased to 20 units every 12 1/30 Linagliptin  Pressure Injuries  Appreciate WOC assistance Wound care as per recommendations   Best practice:  Diet: TF Pain/Anxiety/Delirium protocol (if indicated): per protocol VAP protocol (if indicated): per protocol DVT prophylaxis: apixaban GI prophylaxis: famotidine Glucose control: SSI Mobility: bedrest Code Status: FULL Family Communication: Brother [name]) 1/30 Disposition: ICU   The patient is critically ill with multiple organ systems failure and requires high complexity decision making for assessment and support, frequent evaluation and titration of therapies, application of advanced monitoring technologies and extensive interpretation of multiple databases. Critical Care Time devoted to patient care services described in this note independent of APP/resident  time is 35 minutes.    Kara Mead MD. Shade Flood. [facility]  If no response to pager , please call [phone]   09/12/2019

## 2019-09-13 ENCOUNTER — Inpatient Hospital Stay (HOSPITAL_COMMUNITY): Payer: Medicare Other

## 2019-09-13 DIAGNOSIS — A419 Sepsis, unspecified organism: Secondary | ICD-10-CM

## 2019-09-13 DIAGNOSIS — R6520 Severe sepsis without septic shock: Secondary | ICD-10-CM

## 2019-09-13 LAB — BASIC METABOLIC PANEL
Anion gap: 13 (ref 5–15)
BUN: 49 mg/dL — ABNORMAL HIGH (ref 8–23)
CO2: 32 mmol/L (ref 22–32)
Calcium: 8.8 mg/dL — ABNORMAL LOW (ref 8.9–10.3)
Chloride: 97 mmol/L — ABNORMAL LOW (ref 98–111)
Creatinine, Ser: 0.58 mg/dL (ref 0.44–1.00)
GFR calc Af Amer: >60 mL/min (ref >60)
GFR calc non Af Amer: >60 mL/min (ref >60)
Glucose, Bld: 223 mg/dL — ABNORMAL HIGH (ref 70–99)
Potassium: 3.6 mmol/L (ref 3.5–5.1)
Sodium: 142 mmol/L (ref 135–145)

## 2019-09-13 LAB — GLUCOSE, CAPILLARY
Glucose-Capillary: 211 mg/dL — ABNORMAL HIGH (ref 70–99)
Glucose-Capillary: 245 mg/dL — ABNORMAL HIGH (ref 70–99)
Glucose-Capillary: 152 mg/dL — ABNORMAL HIGH (ref 70–99)
Glucose-Capillary: 250 mg/dL — ABNORMAL HIGH (ref 70–99)
Glucose-Capillary: 295 mg/dL — ABNORMAL HIGH (ref 70–99)
Glucose-Capillary: 318 mg/dL — ABNORMAL HIGH (ref 70–99)

## 2019-09-13 LAB — CBC
HCT: 36.3 % (ref 36.0–46.0)
Hemoglobin: 11.4 g/dL — ABNORMAL LOW (ref 12.0–15.0)
MCH: 29.8 pg (ref 26.0–34.0)
MCHC: 31.4 g/dL (ref 30.0–36.0)
MCV: 95 fL (ref 80.0–100.0)
Platelets: 185 10*3/uL (ref 150–400)
RBC: 3.82 MIL/uL — ABNORMAL LOW (ref 3.87–5.11)
RDW: 14.2 % (ref 11.5–15.5)
WBC: 14 10*3/uL — ABNORMAL HIGH (ref 4.0–10.5)
nRBC: 0.1 % (ref 0.0–0.2)

## 2019-09-13 LAB — CULTURE, BLOOD (ROUTINE X 2)
Culture: NO GROWTH
Special Requests: ADEQUATE

## 2019-09-13 LAB — PHOSPHORUS: Phosphorus: 4 mg/dL (ref 2.5–4.6)

## 2019-09-13 LAB — MAGNESIUM: Magnesium: 1.8 mg/dL (ref 1.7–2.4)

## 2019-09-13 IMAGING — DX DG CHEST 1V PORT
1 series · 1 of 1 positions shown · non-contrast
Comparison: [DATE]

CLINICAL DATA: Ventilator support.  Coronavirus infection.

EXAM:
PORTABLE CHEST 1 VIEW

[chest ap]
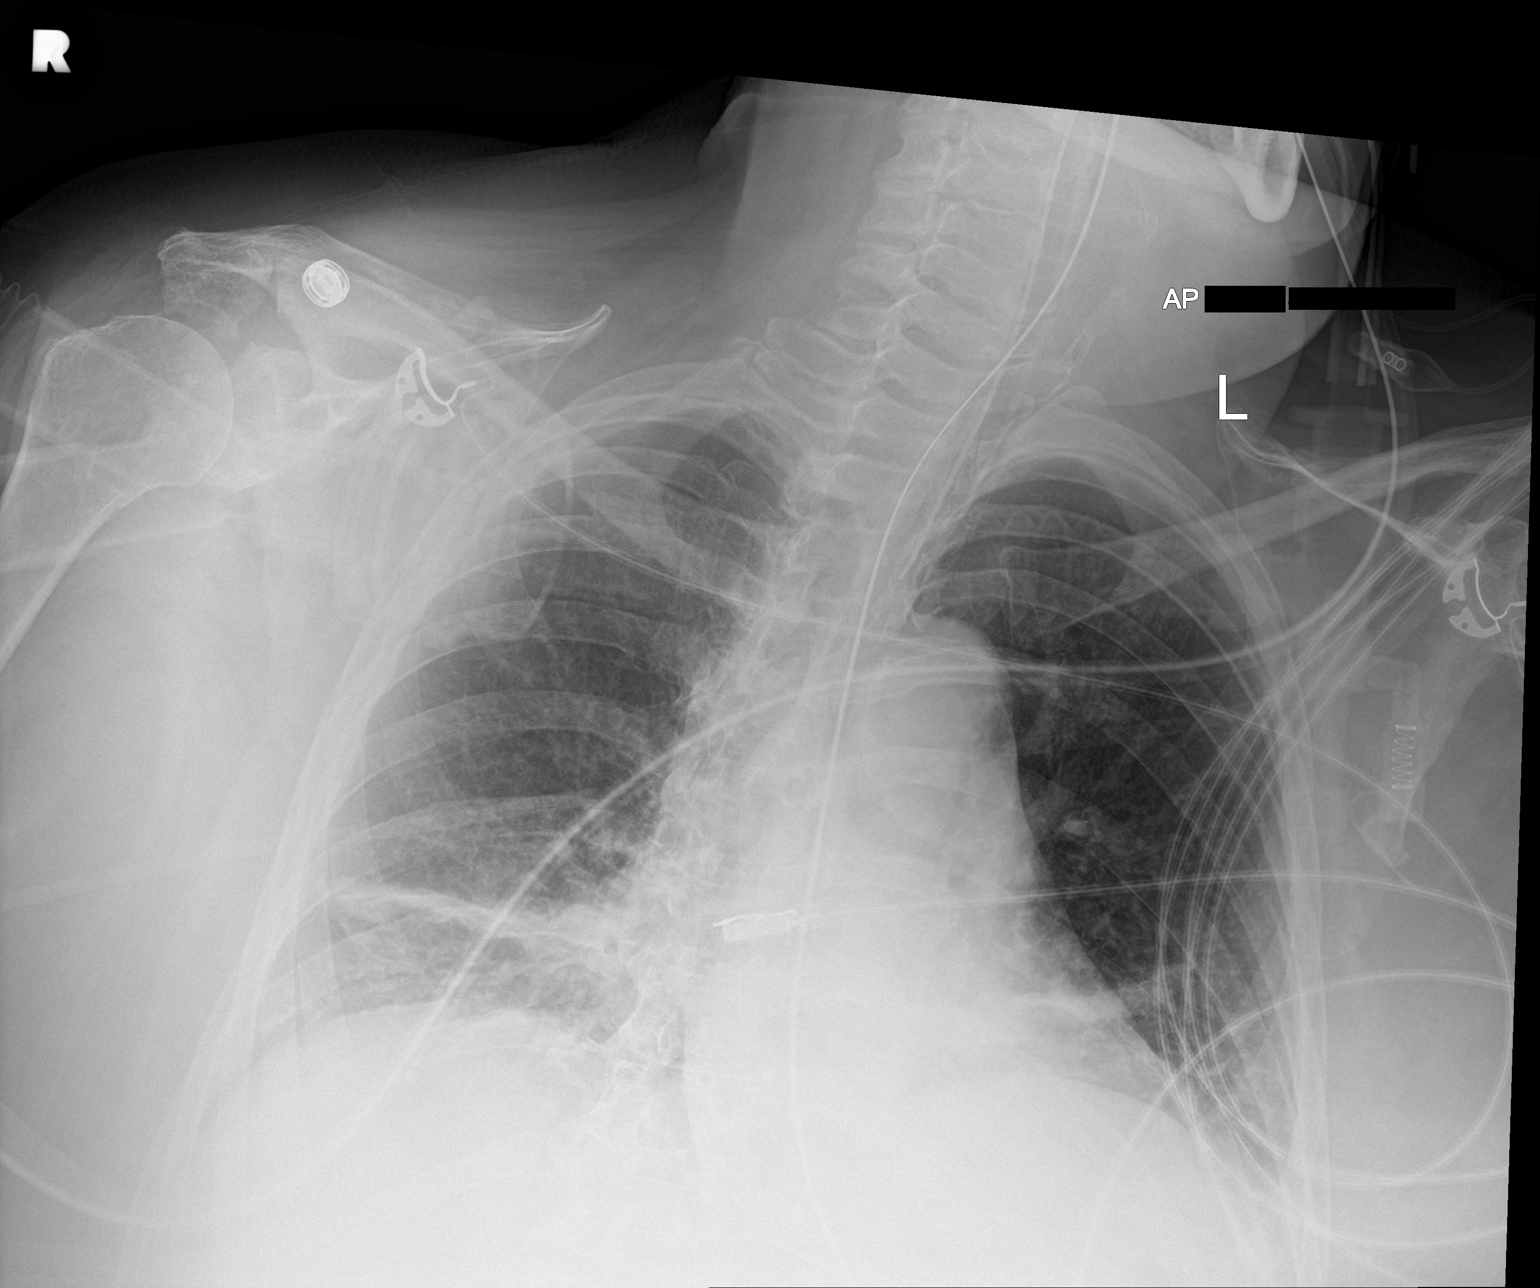

[1 of 1 positions shown; findings below may reference images not displayed]

FINDINGS: Endotracheal tube tip is 5 cm above the carina. Orogastric tube
enters the stomach. Worsening of patchy and linear atelectasis at
the right lung base. Mild atelectasis persists at the left lung
base. Upper lungs appear largely clear.
IMPRESSION: Endotracheal tube well positioned. Worsening of volume loss at the
right lung base. Similar appearance on the left.

## 2019-09-13 MED ORDER — SODIUM CHLORIDE 0.9 % IV SOLN
2.0000 g | Freq: Three times a day (TID) | INTRAVENOUS | Status: DC
  Administered 2019-09-13 – 2019-09-15 (×6): 2 g via INTRAVENOUS
  Filled 2019-09-13 (×7): qty 2

## 2019-09-13 MED ORDER — POTASSIUM CHLORIDE 20 MEQ/15ML (10%) PO SOLN
40.0000 meq | Freq: Once | ORAL | Status: AC
  Administered 2019-09-13: 16:00:00 40 meq
  Filled 2019-09-13: qty 30

## 2019-09-13 MED ORDER — FUROSEMIDE 10 MG/ML IJ SOLN
40.0000 mg | Freq: Every day | INTRAMUSCULAR | Status: DC
  Administered 2019-09-13 – 2019-09-15 (×3): 40 mg via INTRAVENOUS
  Filled 2019-09-13 (×3): qty 4

## 2019-09-13 NOTE — Plan of Care (Signed)
  Problem: Education: Goal: Knowledge of risk factors and measures for prevention of condition will improve Outcome: Progressing   Problem: Coping: Goal: Psychosocial and spiritual needs will be supported Outcome: Progressing   Problem: Respiratory: Goal: Will maintain a patent airway Outcome: Progressing Goal: Complications related to the disease process, condition or treatment will be avoided or minimized Outcome: Progressing   

## 2019-09-13 NOTE — Progress Notes (Signed)
NAME:  Becky Hendricks, MRN:  [id number], DOB:  12-Sep-1945, LOS: 6 ADMISSION DATE:  09/07/2019, CONSULTATION DATE:  09/07/19 REFERRING MD:  [facility]  CHIEF COMPLAINT:  Acute hypoxemic resp failure  Brief History   74 y/o F who presented to [facility] on 1/25 with reports of AMS.  She was recently admitted to [facility] in December for AMS & C-Diff Colitis and discharged to a SNF in [place].  Prior to this, she was living independently. she was diagnosed with COVID on 1/17. she become progressively worse with confusion, increased WOB and acutely AMS the 24 hours prior to admit.  She was intubated in the ER for airway protection and transferred to [facility] 1/25 .      Past Medical History  AF  Bipolar Disorder  DM II  Morbid Obesity  Clostridium Difficile Diarrhea - 08/02/2019.  Brother found her lying in stool at that time.   Significant Hospital Events   1/17 Positive for COVID  1/25 Admit to [facility] after intubation in [facility] ER, COVID + 1/28 Weaning on PSV 1/30 Febrile 101  Consults:    Procedures:  ETT 1/25 >>   Significant Diagnostic Tests:  CT Head 1/25 >> atrophy with mild periventricular small vessel disease. No evident acute infarct. No mass or hemorrhage. There are foci of paranasal sinus disease at several sites.  Micro Data:  1/30 blood >> 1/25 urine >> Tracheal aspirate 1/30 >>  GNR >> MRSA PCR 1/25 >> negative  BCx2 1/25 >> 1/2 coag negative staph (contaminant?) UC 1/25 >> 100k Klebsiella >> sensitive ceftriaxone  Antimicrobials / COVID Rx:  Remdesivir 1/25 >>1/29 Cefepime 1/26 >> 1/26  Ceftriaxone 1/27 >> 1/30 1/31 cefepime (HAP )>> 1/30  Vanc >>  Interim history/subjective:   Critically ill, intubated Low-grade febrile Excellent urine output with Lasix   Objective   Blood pressure 114/65, pulse 84, temperature (!) 100.8 F (38.2 C), temperature source Axillary, resp. rate (!) 28, height 5\' 6"  (1.676 m), weight 110.8 kg, SpO2 94 %.    Vent Mode: PRVC FiO2 (%):  [40 %-50 %] 40  % Set Rate:  [16 bmp] 16 bmp Vt Set:  [410 mL] 410 mL PEEP:  [8 cmH20] 8 cmH20 Pressure Support:  [10 cmH20] 10 cmH20 Plateau Pressure:  [18 cmH20-25 cmH20] 18 cmH20   Intake/Output Summary (Last 24 hours) at 09/13/2019 1402 Last data filed at 09/13/2019 1300 Gross per 24 hour  Intake 1930 ml  Output 1995 ml  Net -65 ml   Filed Weights   09/07/19 2127 09/08/19 0444 09/12/19 0500  Weight: 110.2 kg 109.5 kg 110.8 kg    Examination: General:  Obese, elderly, acutely ill appearing, no distress HEENT: MM pink/moist, ETT oral Neuro: Opens eyes to name, does not follow commands, nonfocal per RN CV: s1s2 RRR, ST, no m/r/g PULM: Coarse bilateral ventilated breath sounds GI: soft, bsx4 active  Extremities: warm/dry, trace edema  Skin: large sacral decubitus, dry / old scabbing on legs, back  Chest x-ray 1/31 personally reviewed, platelike atelectasis both lung bases, stable interstitial infiltrates  Labs show normal electrolytes, elevated glucose, stable renal function, mild leukocytosis  Resolved Hospital Problem list     Assessment & Plan:   Acute Hypoxemic Respiratory Failure secondary to COVID PNA  Mechanical ventilation via ARDS protocol, target PRVC 6 -8cc/kg PEEP to 8, continue spontaneous breathing trials Goal plateau pressure less than 30, driving pressure less than 15 Briefly required prone positioning at admission, not since  Diuresis as blood pressure and renal  function can tolerate, goal CVP 5-8.   VAP prevention order set  COVID-19 pneumonia Dexamethasone, plan 10 days Remdesivir completed  Sepsis secondary to Klebsiella UTI (present on admit), COVID Infection.   Coag negative staph on 1 blood culture, suspect contaminant New fever 1/30  -Broaden antibiotics to cefepime and Vanco while awaiting cultures   Acute Metabolic Encephalopathy  Bipolar Disorder Followed by psychiatry as an outpatient CT Head negative on admit.   RASS goal -1 Intermittent  sedation only Ct Home Seroquel, Depakote     AKI -resolved Following urine output, BMP Avoid nephrotoxins Lasix 40 IV daily  Chronic AF Telemetry Anticoagulation with Eliquis  HTN Home antihypertension agents on hold   DM II -uncontrolled hyperglycemia due to steroids Hgb A1c 9.5 Sliding-scale insulin moderate per protocol Increase TF coverage 8 units every 4 hours Levemir increased to 20 units every 12 1/30 Linagliptin  Unstageable sacral wound Wound care as per recommendations   Best practice:  Diet: TF Pain/Anxiety/Delirium protocol (if indicated): per protocol VAP protocol (if indicated): per protocol DVT prophylaxis: apixaban GI prophylaxis: famotidine Glucose control: SSI Mobility: bedrest Code Status: FULL Family Communication: Brother [name]) 1/30 Disposition: ICU   The patient is critically ill with multiple organ systems failure and requires high complexity decision making for assessment and support, frequent evaluation and titration of therapies, application of advanced monitoring technologies and extensive interpretation of multiple databases. Critical Care Time devoted to patient care services described in this note independent of APP/resident  time is 34 minutes.    Kara Mead MD. Shade Flood. [facility]  If no response to pager , please call [phone]   09/13/2019

## 2019-09-13 NOTE — Progress Notes (Signed)
Used elink to contact patients brother for update and virtual visit.  Patient speaks [place], [place], [place] and [place] according to [place] her brother.  Patient attempting to mouth words to family.  Patient able to move toes slightly at the request of brother in [place].  Patient alert during conversation and answered questions with nods as appropriate.

## 2019-09-13 NOTE — Progress Notes (Signed)
Pharmacy Antibiotic Note  Becky Hendricks is a 74 y.o. female admitted on 09/07/2019 with AMS and required intubation, diagnosed with COVID on 08/30/19 at SNF.  She has received remdesivir and has been on ceftriaxone for Klebsiella UTI.  Pharmacy has been consulted for vancomycin dosing for sacral wound.  Pharmacy is consulted to add Cefepime dosing for fever, possible HCAP (awaiting further tracheal aspirate culture results) and for sacral wound  Plan: - Cefepime 2g IV q8h - Vancomycin 2g IV x 1, then 1500mg  IV q24h for estimated AUC 496 (goal 400-550) - Follow up renal function & cultures  Height: 5\' 6"  (167.6 cm) Weight: 244 lb 4.3 oz (110.8 kg) IBW/kg (Calculated) : 59.3  Temp (24hrs), Avg:100.3 F (37.9 C), Min:98.4 F (36.9 C), Max:101.1 F (38.4 C)  Recent Labs  Lab 09/07/19 1000 09/07/19 1404 09/07/19 1950 09/07/19 1950 09/07/19 2325 09/08/19 1740 09/08/19 2315 09/09/19 0445 09/09/19 0455 09/10/19 0509 09/11/19 0135 09/12/19 0115 09/13/19 0310  WBC   < >  --  7.7   < >  --   --   --  9.2  --  8.0 10.2 13.1* 14.0*  CREATININE   < >  --  1.87*   < >  --   --   --   --  1.06* 0.77 0.76 0.70 0.58  LATICACIDVEN  --  1.8 2.0*  --  2.6* 3.3* 2.1*  --   --   --   --   --   --    < > = values in this interval not displayed.    Estimated Creatinine Clearance: 81.4 mL/min (by C-G formula based on SCr of 0.58 mg/dL).    Not on File  Antimicrobials this admission: 1/25 Cefepime >> 1/27 1/26 Remdesivir >> 1/30 1/27 Ceftriaxone >> 1/30 1/30 Vancomycin >> 1/31 Cefepime >>   Dose adjustments this admission:  Microbiology results: 1/25 BCx: ngtd 1/25 UCx: >100k Klebsiella (pan-sens except amp) 1/25 MRSA PCR: neg 1/25 Resp panel: SARS CoV2 positive, Influenza A/B negative 1/26 BCx 1/2 bottles: CoNS (other bottle NGTD) 1/30 BCx: ngtd 1/30 UCx: 1/30 Trach asp:  Thank you for allowing pharmacy to be a part of this patient's care.  2/30 PharmD, BCPS Clinical  pharmacist phone 7am- 5pm: [phone] 09/13/2019 12:27 PM

## 2019-09-14 DIAGNOSIS — J1520 Pneumonia due to staphylococcus, unspecified: Secondary | ICD-10-CM

## 2019-09-14 LAB — BASIC METABOLIC PANEL
Anion gap: 12 (ref 5–15)
BUN: 56 mg/dL — ABNORMAL HIGH (ref 8–23)
CO2: 31 mmol/L (ref 22–32)
Calcium: 8.6 mg/dL — ABNORMAL LOW (ref 8.9–10.3)
Chloride: 96 mmol/L — ABNORMAL LOW (ref 98–111)
Creatinine, Ser: 0.62 mg/dL (ref 0.44–1.00)
GFR calc Af Amer: >60 mL/min (ref >60)
GFR calc non Af Amer: >60 mL/min (ref >60)
Glucose, Bld: 310 mg/dL — ABNORMAL HIGH (ref 70–99)
Potassium: 4.3 mmol/L (ref 3.5–5.1)
Sodium: 139 mmol/L (ref 135–145)

## 2019-09-14 LAB — URINE CULTURE: Culture: NO GROWTH

## 2019-09-14 LAB — CBC
HCT: 36 % (ref 36.0–46.0)
Hemoglobin: 11.5 g/dL — ABNORMAL LOW (ref 12.0–15.0)
MCH: 30.7 pg (ref 26.0–34.0)
MCHC: 31.9 g/dL (ref 30.0–36.0)
MCV: 96 fL (ref 80.0–100.0)
Platelets: 189 10*3/uL (ref 150–400)
RBC: 3.75 MIL/uL — ABNORMAL LOW (ref 3.87–5.11)
RDW: 14.5 % (ref 11.5–15.5)
WBC: 18.1 10*3/uL — ABNORMAL HIGH (ref 4.0–10.5)
nRBC: 0.2 % (ref 0.0–0.2)

## 2019-09-14 LAB — GLUCOSE, CAPILLARY
Glucose-Capillary: 294 mg/dL — ABNORMAL HIGH (ref 70–99)
Glucose-Capillary: 354 mg/dL — ABNORMAL HIGH (ref 70–99)
Glucose-Capillary: 318 mg/dL — ABNORMAL HIGH (ref 70–99)
Glucose-Capillary: 236 mg/dL — ABNORMAL HIGH (ref 70–99)
Glucose-Capillary: 149 mg/dL — ABNORMAL HIGH (ref 70–99)
Glucose-Capillary: 167 mg/dL — ABNORMAL HIGH (ref 70–99)

## 2019-09-14 LAB — MAGNESIUM: Magnesium: 2.1 mg/dL (ref 1.7–2.4)

## 2019-09-14 LAB — PHOSPHORUS: Phosphorus: 5.1 mg/dL — ABNORMAL HIGH (ref 2.5–4.6)

## 2019-09-14 MED ORDER — VALPROIC ACID 250 MG/5ML PO SOLN
500.0000 mg | Freq: Every day | ORAL | Status: DC
  Administered 2019-09-16 – 2019-09-22 (×7): 500 mg via ORAL
  Filled 2019-09-14 (×8): qty 10

## 2019-09-14 MED ORDER — QUETIAPINE FUMARATE 50 MG PO TABS
250.0000 mg | ORAL_TABLET | Freq: Every day | ORAL | Status: DC
  Administered 2019-09-15 – 2019-09-22 (×8): 250 mg via ORAL
  Filled 2019-09-14: qty 5
  Filled 2019-09-14 (×2): qty 10
  Filled 2019-09-14 (×2): qty 5
  Filled 2019-09-14 (×2): qty 10
  Filled 2019-09-14: qty 5

## 2019-09-14 MED ORDER — CHLORHEXIDINE GLUCONATE 0.12 % MT SOLN
15.0000 mL | Freq: Two times a day (BID) | OROMUCOSAL | Status: DC
  Administered 2019-09-14 – 2019-09-22 (×16): 15 mL via OROMUCOSAL
  Filled 2019-09-14 (×15): qty 15

## 2019-09-14 MED ORDER — INSULIN DETEMIR 100 UNIT/ML ~~LOC~~ SOLN
30.0000 [IU] | Freq: Two times a day (BID) | SUBCUTANEOUS | Status: DC
  Administered 2019-09-14 – 2019-09-22 (×17): 30 [IU] via SUBCUTANEOUS
  Filled 2019-09-14 (×18): qty 0.3

## 2019-09-14 MED ORDER — APIXABAN 5 MG PO TABS
5.0000 mg | ORAL_TABLET | Freq: Two times a day (BID) | ORAL | Status: DC
  Administered 2019-09-15 – 2019-09-22 (×15): 5 mg via ORAL
  Filled 2019-09-14 (×15): qty 1

## 2019-09-14 MED ORDER — ACETAMINOPHEN 160 MG/5ML PO SOLN
650.0000 mg | ORAL | Status: DC | PRN
  Filled 2019-09-14: qty 20.3

## 2019-09-14 NOTE — Progress Notes (Signed)
NAME:  Becky Hendricks, MRN:  [id number], DOB:  25-Aug-1945, LOS: 7 ADMISSION DATE:  09/07/2019, CONSULTATION DATE:  09/07/19 REFERRING MD:  [facility]  CHIEF COMPLAINT:  Acute hypoxemic resp failure  Brief History   74 y/o F who presented to [facility] on 1/25 with reports of AMS.  She was recently admitted to [facility] in December for AMS & C-Diff Colitis and discharged to a SNF in [place].  Prior to this, she was living independently. she was diagnosed with COVID on 1/17. she become progressively worse with confusion, increased WOB and acutely AMS the 24 hours prior to admit.  She was intubated in the ER for airway protection and transferred to [facility] 1/25 .      Past Medical History  AF  Bipolar Disorder  DM II  Morbid Obesity  Clostridium Difficile Diarrhea - 08/02/2019.  Brother found her lying in stool at that time.   Significant Hospital Events   1/17 Positive for COVID  1/25 Admit to [facility] after intubation in [facility] ER, COVID + 1/28 Weaning on PSV 1/30 Febrile 101 2/01 Extubated   Consults:    Procedures:  ETT 1/25 >> 2/1  Significant Diagnostic Tests:  CT Head 1/25 >> atrophy with mild periventricular small vessel disease. No evident acute infarct. No mass or hemorrhage. There are foci of paranasal sinus disease at several sites.  Micro Data:  Tracheal aspirate 1/30 >> few staph aureus >>  MRSA PCR 1/25 >> negative  BCx2 1/25 >> 1/2 coag negative staph (contaminant?) UC 1/25 >> 100k Klebsiella >> sensitive ceftriaxone BCx2 1/30 >>  UC 1/30 >> negative     Antimicrobials / COVID Rx:  Remdesivir 1/25 >>1/29 Cefepime 1/26 >> 1/26  Ceftriaxone 1/27 >> 1/30 Cefepine 1/31 (HAP) >> Vanc 1/30 >>  Interim history/subjective:  Weaning per RT on PSV No acute events overnight  Few staph noted in tracheal aspirate  Glucose range 236-354 1.9L UOP, net neg 1.6L in last 24h Afebrile  Objective   Blood pressure (!) 126/55, pulse 74, temperature 98.4 F (36.9 C), temperature source Oral, resp.  rate (!) 33, height 5\' 6"  (1.676 m), weight 111.6 kg, SpO2 100 %.    Vent Mode: PRVC FiO2 (%):  [40 %] 40 % Set Rate:  [16 bmp] 16 bmp Vt Set:  [410 mL] 410 mL PEEP:  [8 cmH20] 8 cmH20 Plateau Pressure:  [18 cmH20-20 cmH20] 18 cmH20   Intake/Output Summary (Last 24 hours) at 09/14/2019 1416 Last data filed at 09/14/2019 1300 Gross per 24 hour  Intake 1770.79 ml  Output 4140 ml  Net -2369.21 ml   Filed Weights   09/08/19 0444 09/12/19 0500 09/13/19 1954  Weight: 109.5 kg 110.8 kg 111.6 kg    Examination: General: elderly female lying in bed in NAD on vent  HEENT: MM pink/moist, ETT in place, pupils =/reactive  Neuro: awake, alert, follows simple commands in [place], more consistent in [place] CV: s1s2 RRR, no m/r/g PULM:  Non-labored on vent, lungs bilaterally clear anterior but diminished  GI: soft, bsx4 active  Extremities: warm/dry, trace to 1+ generalized edema  Skin: no rashes or lesions  LABS: Recent Labs  Lab 09/12/19 0115 09/13/19 0310 09/14/19 0430  HGB 11.5* 11.4* 11.5*  HCT 36.7 36.3 36.0  WBC 13.1* 14.0* 18.1*  PLT 176 185 189   Recent Labs  Lab 09/07/19 1950 09/07/19 2207 09/10/19 0509 09/10/19 0509 09/11/19 0135 09/11/19 0135 09/12/19 0115 09/12/19 0115 09/13/19 0310 09/14/19 0430  NA 143   < >  142  --  140  --  141  --  142 139  K 4.8   < > 4.4   < > 4.2   < > 3.9   < > 3.6 4.3  CL 101   < > 105  --  99  --  97*  --  97* 96*  CO2 27   < > 29  --  32  --  32  --  32 31  GLUCOSE 395*   < > 249*  --  318*  --  328*  --  223* 310*  BUN 68*   < > 57*  --  56*  --  53*  --  49* 56*  CREATININE 1.87*   < > 0.77  --  0.76  --  0.70  --  0.58 0.62  CALCIUM 8.4*   < > 8.2*  --  8.5*  --  8.7*  --  8.8* 8.6*  MG 2.2  --   --   --   --   --   --   --  1.8 2.1  PHOS 2.5  --   --   --   --   --   --   --  4.0 5.1*   < > = values in this interval not displayed.     Resolved Hospital Problem list   AKI  Assessment & Plan:   Acute Hypoxemic Respiratory  Failure secondary to COVID PNA  Briefly required prone positioning on admit. Completed remdesivir.  -PSV wean for extubation  -O2 to support sats >88% -push pulmonary hygiene - IS, mobilize -PT / OT, SLP consult -continue decadron  -continue lasix 40 mg IV QD for now  Sepsis secondary to Klebsiella UTI (present on admit), COVID Infection.   Coag negative staph on 1 blood culture, suspect contaminant Staph in Tracheal Aspirate  Fever - 1/30 -continue cefepime, vancomycin  -follow cultures to maturity   Acute Metabolic Encephalopathy  Bipolar Disorder Followed by psychiatry as an outpatient. CT Head negative on admit.   -stop sedation post extubation  -continue home seroquel, depakote    Chronic AF -Tele monitoring  -continue eliquis   HTN -hold home antihypertensives  DM II  Uncontrolled hyperglycemia due to steroids.  Hgb A1c 9.5 -SSI -continue levemir 30 units BID  -stop TF coverage while NPO  -start linagliptin once taking PO's   Unstageable sacral wound -wound care per WOC recommendations  -will need surgical evaluation at some point for possible debridement > may require transfer to [facility]    Best practice:  Diet: NPO Pain/Anxiety/Delirium protocol (if indicated): n/a VAP protocol (if indicated): per protocol DVT prophylaxis: apixaban GI prophylaxis: n/a Glucose control: SSI Mobility: bedrest Code Status: FULL Family Communication: Brother [name]) updated on plan of care 2/1 Disposition: ICU   CC Time: 30 minutes   Canary Brim, MSN, NP-C [facility] 09/14/2019, 2:17 PM   Please see [email or web address] for pager details.

## 2019-09-14 NOTE — Procedures (Signed)
Extubation Procedure Note  Patient Details:   Name: Becky Hendricks DOB: Feb 12, 1946 MRN: [id number]   Airway Documentation:    Vent end date: 09/14/19 Vent end time: 1030   Evaluation  O2 sats: stable throughout Complications: No apparent complications Patient did tolerate procedure well. Bilateral Breath Sounds: Clear, Diminished   Yes   Pt extubated to 6L HFNC with no complications. VS within normal limits. Pt has a week, non productive cough but was encouraged to use [place] to clear secretions. RT will continue to monitor  Carolan Shiver 09/14/2019, 2:23 PM

## 2019-09-15 DIAGNOSIS — J9691 Respiratory failure, unspecified with hypoxia: Secondary | ICD-10-CM

## 2019-09-15 DIAGNOSIS — G9341 Metabolic encephalopathy: Secondary | ICD-10-CM

## 2019-09-15 DIAGNOSIS — J1282 Pneumonia due to coronavirus disease 2019: Secondary | ICD-10-CM

## 2019-09-15 DIAGNOSIS — A499 Bacterial infection, unspecified: Secondary | ICD-10-CM

## 2019-09-15 DIAGNOSIS — U071 COVID-19: Secondary | ICD-10-CM

## 2019-09-15 DIAGNOSIS — N390 Urinary tract infection, site not specified: Secondary | ICD-10-CM

## 2019-09-15 DIAGNOSIS — N179 Acute kidney failure, unspecified: Secondary | ICD-10-CM

## 2019-09-15 DIAGNOSIS — L89300 Pressure ulcer of unspecified buttock, unstageable: Secondary | ICD-10-CM

## 2019-09-15 HISTORY — DX: Pneumonia due to coronavirus disease 2019: J12.82

## 2019-09-15 HISTORY — DX: Urinary tract infection, site not specified: N39.0

## 2019-09-15 HISTORY — DX: COVID-19: U07.1

## 2019-09-15 LAB — CBC
HCT: 38.7 % (ref 36.0–46.0)
Hemoglobin: 12.4 g/dL (ref 12.0–15.0)
MCH: 30.8 pg (ref 26.0–34.0)
MCHC: 32 g/dL (ref 30.0–36.0)
MCV: 96 fL (ref 80.0–100.0)
Platelets: 231 10*3/uL (ref 150–400)
RBC: 4.03 MIL/uL (ref 3.87–5.11)
RDW: 14.3 % (ref 11.5–15.5)
WBC: 14.2 10*3/uL — ABNORMAL HIGH (ref 4.0–10.5)
nRBC: 0 % (ref 0.0–0.2)

## 2019-09-15 LAB — GLUCOSE, CAPILLARY
Glucose-Capillary: 147 mg/dL — ABNORMAL HIGH (ref 70–99)
Glucose-Capillary: 132 mg/dL — ABNORMAL HIGH (ref 70–99)
Glucose-Capillary: 164 mg/dL — ABNORMAL HIGH (ref 70–99)
Glucose-Capillary: 217 mg/dL — ABNORMAL HIGH (ref 70–99)
Glucose-Capillary: 253 mg/dL — ABNORMAL HIGH (ref 70–99)

## 2019-09-15 LAB — BASIC METABOLIC PANEL
Anion gap: 10 (ref 5–15)
BUN: 40 mg/dL — ABNORMAL HIGH (ref 8–23)
CO2: 30 mmol/L (ref 22–32)
Calcium: 8.5 mg/dL — ABNORMAL LOW (ref 8.9–10.3)
Chloride: 101 mmol/L (ref 98–111)
Creatinine, Ser: 0.53 mg/dL (ref 0.44–1.00)
GFR calc Af Amer: >60 mL/min (ref >60)
GFR calc non Af Amer: >60 mL/min (ref >60)
Glucose, Bld: 162 mg/dL — ABNORMAL HIGH (ref 70–99)
Potassium: 3.8 mmol/L (ref 3.5–5.1)
Sodium: 141 mmol/L (ref 135–145)

## 2019-09-15 LAB — C-REACTIVE PROTEIN: CRP: 7 mg/dL — ABNORMAL HIGH (ref <1.0)

## 2019-09-15 LAB — D-DIMER, QUANTITATIVE: D-Dimer, Quant: 0.59 ug/mL-FEU — ABNORMAL HIGH (ref 0.00–0.50)

## 2019-09-15 LAB — FERRITIN: Ferritin: 456 ng/mL — ABNORMAL HIGH (ref 11–307)

## 2019-09-15 MED ORDER — GUAIFENESIN-DM 100-10 MG/5ML PO SYRP: 5.0000 mL | ORAL_SOLUTION | ORAL | Status: DC | PRN

## 2019-09-15 MED ORDER — HYDROCOD POLST-CPM POLST ER 10-8 MG/5ML PO SUER
5.0000 mL | Freq: Two times a day (BID) | ORAL | Status: DC
  Administered 2019-09-15 – 2019-09-21 (×12): 5 mL via ORAL
  Filled 2019-09-15 (×13): qty 5

## 2019-09-15 MED ORDER — AMLODIPINE BESYLATE 5 MG PO TABS
5.0000 mg | ORAL_TABLET | Freq: Every day | ORAL | Status: DC
  Administered 2019-09-15 – 2019-09-19 (×5): 5 mg via ORAL
  Filled 2019-09-15 (×5): qty 1

## 2019-09-15 MED ORDER — METOPROLOL TARTRATE 25 MG PO TABS
25.0000 mg | ORAL_TABLET | Freq: Two times a day (BID) | ORAL | Status: DC
  Administered 2019-09-15 – 2019-09-22 (×15): 25 mg via ORAL
  Filled 2019-09-15 (×15): qty 1

## 2019-09-15 MED ORDER — INSULIN ASPART 100 UNIT/ML ~~LOC~~ SOLN
0.0000 [IU] | Freq: Every day | SUBCUTANEOUS | Status: DC
  Administered 2019-09-15: 3 [IU] via SUBCUTANEOUS
  Administered 2019-09-16 – 2019-09-20 (×3): 2 [IU] via SUBCUTANEOUS

## 2019-09-15 MED ORDER — HYDRALAZINE HCL 20 MG/ML IJ SOLN
10.0000 mg | INTRAMUSCULAR | Status: DC | PRN
  Administered 2019-09-15: 10 mg via INTRAVENOUS
  Filled 2019-09-15 (×2): qty 1

## 2019-09-15 MED ORDER — LISINOPRIL 20 MG PO TABS: 20.0000 mg | ORAL_TABLET | Freq: Every day | ORAL | Status: DC

## 2019-09-15 MED ORDER — INSULIN ASPART 100 UNIT/ML ~~LOC~~ SOLN
0.0000 [IU] | Freq: Three times a day (TID) | SUBCUTANEOUS | Status: DC
  Administered 2019-09-16: 11 [IU] via SUBCUTANEOUS
  Administered 2019-09-16: 3 [IU] via SUBCUTANEOUS
  Administered 2019-09-16: 17:00:00 5 [IU] via SUBCUTANEOUS
  Administered 2019-09-17: 11 [IU] via SUBCUTANEOUS
  Administered 2019-09-17: 09:00:00 3 [IU] via SUBCUTANEOUS
  Administered 2019-09-18: 2 [IU] via SUBCUTANEOUS
  Administered 2019-09-18 – 2019-09-19 (×2): 5 [IU] via SUBCUTANEOUS
  Administered 2019-09-19: 17:00:00 8 [IU] via SUBCUTANEOUS
  Administered 2019-09-20: 2 [IU] via SUBCUTANEOUS
  Administered 2019-09-20: 12:00:00 5 [IU] via SUBCUTANEOUS
  Administered 2019-09-21: 2 [IU] via SUBCUTANEOUS
  Administered 2019-09-21 (×2): 3 [IU] via SUBCUTANEOUS
  Administered 2019-09-22: 8 [IU] via SUBCUTANEOUS
  Administered 2019-09-22: 2 [IU] via SUBCUTANEOUS
  Administered 2019-09-22: 3 [IU] via SUBCUTANEOUS

## 2019-09-15 NOTE — Evaluation (Signed)
Physical Therapy Evaluation Patient Details Name: Becky Hendricks MRN: [id number] DOB: April 27, 1946 Today's Date: 09/15/2019   History of Present Illness  74 y/o F who presented to [facility] on 1/25 with reports of AMS.  She was recently admitted to [facility] in December for AMS & C-Diff Colitis and discharged to a SNF in [place].  Prior to this, she was living independently. she was diagnosed with COVID on 1/17. she become progressively worse with confusion, increased WOB and acutely AMS the 24 hours prior to admit.  She was intubated in the ER for airway protection and transferred to [facility] 1/25 .  Pt extubated on 09/14/19.    Clinical Impression  The patient received alert confused, , 96 on 3.5 L [place](ear), HR 89, BP 189/100(small cuff) , placed regular cuff with 179/94, sitting BP 159/73. Spo2 remained > 93%. The patient presents with RUE weaker than Left, able to move each LE but decreased strength noted. The patient comes from SNF after hospital stay in Dec for afib and AMS and fall. Pt admitted with above diagnosis.  Pt currently with functional limitations due to the deficits listed below (see PT Problem List). Pt will benefit from skilled PT to increase their independence and safety with mobility to allow discharge to the venue listed below.   .     Follow Up Recommendations SNF    Equipment Recommendations  None recommended by PT    Recommendations for Other Services       Precautions / Restrictions Precautions Precautions: Fall;Other (comment) Precaution Comments: Monitor BP. Catheter. Rectal tube. Restrictions Weight Bearing Restrictions: No      Mobility  Bed Mobility Overal bed mobility: Needs Assistance Bed Mobility: Rolling Rolling: Total assist;+2 for safety/equipment;+2 for physical assistance         General bed mobility comments: Total assist to weight shift in bed to reposition  Transfers                 General transfer comment: Not attempted this date due to safety  concerns and high BP. Pt able to tolerate chair position.   Ambulation/Gait                Stairs            Wheelchair Mobility    Modified Rankin (Stroke Patients Only)       Balance Overall balance assessment: Needs assistance Sitting-balance support: Bilateral upper extremity supported Sitting balance-Leahy Scale: Zero Sitting balance - Comments: assisted wit 2 total to lean forward in bed, does not self support trunk                                     Pertinent Vitals/Pain Pain Assessment: Faces Faces Pain Scale: Hurts a little bit Pain Location: general Pain Intervention(s): Monitored during session    Home Living Family/patient expects to be discharged to:: Skilled nursing facility                 Additional Comments: Pt admitted to hospital December 2020 and discharged to a SNF. Prior to December 2020, pt was living at home independently.     Prior Function Level of Independence: Independent         Comments: Prior to December 2020 admission, pt was living alone in a condo, independent with all ADLs, IADLs, and mobility. Pt did not ambulate with an assistive device.      Hand Dominance  Dominant Hand: Right    Extremity/Trunk Assessment   Upper Extremity Assessment Upper Extremity Assessment: Defer to OT evaluation RUE Deficits / Details: Pt initiates RUE ROM, minimal movement noted. Pt able to make fist. PROM elbow, wrist, and digit WFL. PROM shld flex ~90 degrees. No reports of pain. Per chart review from previous admission: "LUE WFL: RUE decreased AROM in R shoulder to ~30* flexion/abduction," RUE Sensation: (Pt reports numbness and tingling in right hand) RUE Coordination: decreased fine motor;decreased gross motor LUE Deficits / Details: LUE AROM shld flex ~20 degrees. LUE AROM elbow, wrist, and digit ROM WFL with increased time to complete. PROM shld flex ~90 degrees. No reports of pain. LUE Sensation: (No reports  of numbness and tingling LUE.) LUE Coordination: decreased fine motor;decreased gross motor    Lower Extremity Assessment Lower Extremity Assessment: RLE deficits/detail;LLE deficits/detail RLE Deficits / Details: active dorsiflexion, knee extension 3, hip flexion 2, appreciates LT LLE Deficits / Details: about the same as right    Cervical / Trunk Assessment Cervical / Trunk Assessment: Other exceptions Cervical / Trunk Exceptions: requires assistance to sit forward, does not support  Communication   Communication: No difficulties  Cognition Arousal/Alertness: Awake/alert Behavior During Therapy: Flat affect;WFL for tasks assessed/performed Overall Cognitive Status: Impaired/Different from baseline Area of Impairment: Orientation;Attention;Memory;Following commands;Safety/judgement;Awareness;Problem solving                 Orientation Level: Disoriented to;Time;Situation Current Attention Level: Focused Memory: Decreased recall of precautions;Decreased short-term memory Following Commands: Follows one step commands inconsistently;Follows one step commands with increased time Safety/Judgement: Decreased awareness of safety;Decreased awareness of deficits Awareness: Intellectual Problem Solving: Slow processing;Decreased initiation;Requires verbal cues;Requires tactile cues General Comments: Pt easily distracted by environment requiring mod to max redirection back to tasks. Pt does initiate tasks, however becomes distracted and does not complete.        General Comments General comments (skin integrity, edema, etc.): Pt on 2.5L [place] with SpO2 maintaining in 90s throughout. BP high with RN aware. Pt able to tolerate chair position for 20+ min. Pt left in chair position with RN aware and in room.     Exercises General Exercises - Upper Extremity Shoulder Flexion: AROM;PROM;Both;10 reps Elbow Flexion: PROM;AROM;Both;10 reps Elbow Extension: PROM;AROM;Both;10 reps Digit  Composite Flexion: PROM;AROM;Both;10 reps Other Exercises Other Exercises: Worked on shoulder shrugs x 5 with pt able to complete actively bilaterally.    Assessment/Plan    PT Assessment Patient needs continued PT services  PT Problem List Decreased strength;Decreased mobility;Decreased safety awareness;Decreased range of motion;Decreased coordination;Decreased knowledge of precautions;Obesity;Decreased activity tolerance;Decreased cognition;Cardiopulmonary status limiting activity;Decreased balance;Decreased knowledge of use of DME       PT Treatment Interventions Therapeutic activities;Cognitive remediation;Therapeutic exercise;Patient/family education;Balance training;Functional mobility training    PT Goals (Current goals can be found in the Care Plan section)  Acute Rehab PT Goals Patient Stated Goal: Pt agreeable to sitting up in bed. PT Goal Formulation: Patient unable to participate in goal setting Time For Goal Achievement: 09/29/19 Potential to Achieve Goals: Fair    Frequency Min 2X/week   Barriers to discharge        Co-evaluation PT/OT/SLP Co-Evaluation/Treatment: Yes Reason for Co-Treatment: Complexity of the patient's impairments (multi-system involvement);For patient/therapist safety PT goals addressed during session: Mobility/safety with mobility OT goals addressed during session: ADL's and self-care       AM-PAC PT "6 Clicks" Mobility  Outcome Measure Help needed turning from your back to your side while in a flat bed without using bedrails?: Total  Help needed moving from lying on your back to sitting on the side of a flat bed without using bedrails?: Total Help needed moving to and from a bed to a chair (including a wheelchair)?: Total Help needed standing up from a chair using your arms (e.g., wheelchair or bedside chair)?: Total Help needed to walk in hospital room?: Total Help needed climbing 3-5 steps with a railing? : Total 6 Click Score: 6    End  of Session Equipment Utilized During Treatment: Oxygen Activity Tolerance: Patient tolerated treatment well Patient left: in bed;with call bell/phone within reach;with nursing/sitter in room Nurse Communication: Mobility status PT Visit Diagnosis: Unsteadiness on feet (R26.81);Muscle weakness (generalized) (M62.81)    Time: 9437-0052 PT Time Calculation (min) (ACUTE ONLY): 48 min   Charges:   PT Evaluation $PT Eval Moderate Complexity: 1 Mod          Blanchard Kelch PT Acute Rehabilitation Services  Office [phone]   Rada Hay 09/15/2019, 12:55 PM

## 2019-09-15 NOTE — Evaluation (Signed)
Clinical/Bedside Swallow Evaluation Patient Details  Name: Becky Hendricks MRN: [id number] Date of Birth: 08-Jul-1946  Today's Date: 09/15/2019 Time: SLP Start Time (ACUTE ONLY): 1212 SLP Stop Time (ACUTE ONLY): 1230 SLP Time Calculation (min) (ACUTE ONLY): 18 min  Past Medical History:  Past Medical History:  Diagnosis Date  . Atrial fibrillation ([place])   . Bipolar disorder ([place])   . Clostridium difficile diarrhea 08/02/2019  . Dehydration   . Essential hypertension   . Morbid obesity ([place])   . Type 2 diabetes mellitus ([place])   . Weakness    Past Surgical History: History reviewed. No pertinent surgical history. HPI:  74 y/o F who presented to [facility] on 1/25 with reports of AMS with recent admission to [facility] 12/20 for AMS & C-Diff Colitis and discharged to a SNF in [place]. PMH; DM, morbid obesity, essential HTN, bipolar, A-fib. Prior to this, she was living independently. she was diagnosed with COVID on 1/17. she become progressively worse with confusion, increased WOB. intubated 1/25-09/14/19. Transferred [place] 1/25   Assessment / Plan / Recommendation Clinical Impression  Prior to assessment RN reported providing water and applesauce without s/s aspiration. Compared to post extubation documentation, her vocal intensity and clarity was good and volitional cough resembled throat clear. Cognition is diminished following 8 day extubation/post Covid/meds etc but expected to improve given time. Subtle throat clearing followed sips inconsistently, not concerning for aspiration event during assessment. She consumed consecutive cup sips requiring pacing from therapist but controlled respirations during swallow cycle. She was able to hold cup with assist and will continue to need help for feeding and rate/volume control (pt should attempt to self feed as able). For endurance, a Dys 3 (chopped meats) texture recommended with thin (straw sips alloweded), pills whole in puree and short term ST for advancement,  education and safety.       SLP Visit Diagnosis: Dysphagia, unspecified (R13.10)    Aspiration Risk  Mild aspiration risk    Diet Recommendation Dysphagia 3 (Mech soft);Thin liquid   Liquid Administration via: Cup;Straw Medication Administration: Whole meds with puree Supervision: Patient able to self feed;Staff to assist with self feeding Compensations: Slow rate;Small sips/bites Postural Changes: Seated upright at 90 degrees    Other  Recommendations Oral Care Recommendations: Oral care BID   Follow up Recommendations None      Frequency and Duration min 2x/week  1 week       Prognosis Prognosis for Safe Diet Advancement: Good      Swallow Study   General HPI: 74 y/o F who presented to [facility] on 1/25 with reports of AMS with recent admission to [facility] 12/20 for AMS & C-Diff Colitis and discharged to a SNF in [place]. PMH; DM, morbid obesity, essential HTN, bipolar, A-fib. Prior to this, she was living independently. she was diagnosed with COVID on 1/17. she become progressively worse with confusion, increased WOB. intubated 1/25-09/14/19. Transferred [facility] 1/25 Type of Study: Bedside Swallow Evaluation Previous Swallow Assessment: (none) Diet Prior to this Study: NPO Temperature Spikes Noted: No Respiratory Status: Nasal cannula History of Recent Intubation: Yes Length of Intubations (days): 8 days Date extubated: 09/14/19 Behavior/Cognition: Alert;Cooperative;Pleasant mood;Confused Oral Cavity Assessment: Other (comment)(lingual candidia) Oral Care Completed by SLP: Yes Oral Cavity - Dentition: Adequate natural dentition Vision: Functional for self-feeding Self-Feeding Abilities: Needs assist Patient Positioning: Upright in bed Baseline Vocal Quality: Normal Volitional Cough: Weak Volitional Swallow: Able to elicit    Oral/Motor/Sensory Function Overall Oral Motor/Sensory Function: Within functional limits   Ice Chips  Ice chips: Not tested   Thin Liquid Thin Liquid:  Impaired Presentation: Cup;Straw Oral Phase Impairments: Reduced labial seal Pharyngeal  Phase Impairments: Throat Clearing - Immediate    Nectar Thick Nectar Thick Liquid: Not tested   Honey Thick Honey Thick Liquid: Not tested   Puree Puree: Within functional limits   Solid     Solid: Impaired Pharyngeal Phase Impairments: Throat Clearing - Delayed      Royce Macadamia 09/15/2019,12:56 PM  Breck Coons Lonell Face.Ed Nurse, children's [phone] Office [phone]

## 2019-09-15 NOTE — Progress Notes (Signed)
Pt brother updated and all questions answered.  

## 2019-09-15 NOTE — Progress Notes (Signed)
Occupational Therapy Evaluation Patient Details Name: Becky Hendricks MRN: [id number] DOB: 06/13/1946 Today's Date: 09/15/2019    History of Present Illness 74 y/o F who presented to [facility] on 1/25 with reports of AMS.  She was recently admitted to [facility] in December for AMS & C-Diff Colitis and discharged to a SNF in [place].  Prior to this, she was living independently. she was diagnosed with COVID on 1/17. she become progressively worse with confusion, increased WOB and acutely AMS the 24 hours prior to admit.  She was intubated in the ER for airway protection and transferred to [facility] 1/25 .  Pt extubated on 09/14/19.     Clinical Impression   Pt admitted to hospital December 2020 and discharged to a SNF. Pt recently admitted to the hospital from SNF due to Covid-19. Prior to December 2020, pt was living at home independently and ambulating without an assistive device. Pt currently on 2.5L [place] with SpO2 maintaining in 90s throughout. Pt's BP high throughout session with RN aware (see below for details). Pt placed in chair position, able to tolerate for 20+ minutes. Educated pt on BUE ROM exercises with poor to fair understanding and follow through. Attempted rolling and reaching across body with pt requiring max hand over hand assist. Instructed pt on holding onto bedrails to pull self forward in chair position with pt requiring total assist x 2. Pt able to engage in face washing task requiring mod hand over hand assist. LUE noted to be moving more than RUE. Educated pt on positioning of BUEs to prevent joint contractures. Pt demonstrates decreased cognition, ROM, GMC, FMC, strength, endurance, balance, sitting tolerance, activity tolerance, and safety awareness impacting ability to complete self-care and functional transfer tasks. Recommend skilled OT services to address above deficits in order to promote function and prevent further decline. Recommend SNF placement for additional rehab following hospital  discharge.   Semi-reclined @ start of session: 189/114mmHg. Semi-reclined 3 minutes later: 174/1101mmHg.  Chair position initial: 179/51mmHg. Chair position 6 minutes later: 159/44mmHg. Chair position 6 minutes later: 161/30mmHg. Chair position end of session: 158/139mmHg.    Follow Up Recommendations  SNF    Equipment Recommendations  Other (comment)(TBD at next venue of care)    Recommendations for Other Services       Precautions / Restrictions Precautions Precautions: Fall;Other (comment) Precaution Comments: Monitor BP. Catheter. Rectal tube. Restrictions Weight Bearing Restrictions: No      Mobility Bed Mobility Overal bed mobility: Needs Assistance Bed Mobility: Rolling Rolling: Total assist;+2 for safety/equipment;+2 for physical assistance         General bed mobility comments: Total assist to weight shift in bed to reposition  Transfers                 General transfer comment: Not attempted this date due to safety concerns and high BP. Pt able to tolerate chair position.     Balance Overall balance assessment: (Not tested this date. Chair position only)                                         ADL either performed or assessed with clinical judgement   ADL Overall ADL's : Needs assistance/impaired Eating/Feeding: NPO   Grooming: Moderate assistance;Bed level   Upper Body Bathing: Maximal assistance;Bed level   Lower Body Bathing: +2 for physical assistance;Total assistance;Bed level   Upper Body Dressing : Bed level;Total  assistance   Lower Body Dressing: +2 for physical assistance;Total assistance;Bed level   Toilet Transfer: Total assistance;+2 for physical assistance(bed level. Pt currently has rectal tube and catheter)   Toileting- Clothing Manipulation and Hygiene: Total assistance;+2 for physical assistance;Bed level       Functional mobility during ADLs: (Not attempted this date. ) General ADL Comments: Pt able  to tolerate chair position in bed this date.     Vision         Perception     Praxis      Pertinent Vitals/Pain Pain Assessment: No/denies pain     Hand Dominance Right   Extremity/Trunk Assessment Upper Extremity Assessment Upper Extremity Assessment: RUE deficits/detail;LUE deficits/detail;Generalized weakness RUE Deficits / Details: Pt initiates RUE ROM, minimal movement noted. Pt able to make fist. PROM elbow, wrist, and digit WFL. PROM shld flex ~90 degrees. No reports of pain. Per chart review from previous admission: "LUE WFL: RUE decreased AROM in R shoulder to ~30* flexion/abduction," RUE Sensation: (Pt reports numbness and tingling in right hand) RUE Coordination: decreased fine motor;decreased gross motor LUE Deficits / Details: LUE AROM shld flex ~20 degrees. LUE AROM elbow, wrist, and digit ROM WFL with increased time to complete. PROM shld flex ~90 degrees. No reports of pain. LUE Sensation: (No reports of numbness and tingling LUE.) LUE Coordination: decreased fine motor;decreased gross motor   Lower Extremity Assessment Lower Extremity Assessment: Defer to PT evaluation       Communication Communication Communication: No difficulties   Cognition Arousal/Alertness: Awake/alert Behavior During Therapy: Flat affect;WFL for tasks assessed/performed Overall Cognitive Status: Impaired/Different from baseline Area of Impairment: Orientation;Attention;Memory;Following commands;Safety/judgement;Awareness;Problem solving                 Orientation Level: Disoriented to;Time;Situation Current Attention Level: Focused Memory: Decreased recall of precautions;Decreased short-term memory Following Commands: Follows one step commands inconsistently;Follows one step commands with increased time Safety/Judgement: Decreased awareness of safety;Decreased awareness of deficits Awareness: Intellectual Problem Solving: Slow processing;Decreased initiation;Requires  verbal cues;Requires tactile cues General Comments: Pt easily distracted by environment requiring mod to max redirection back to tasks. Pt does initiate tasks, however becomes distracted and does not complete.     General Comments  Pt on 2.5L [place] with SpO2 maintaining in 90s throughout. BP high with RN aware. Pt able to tolerate chair position for 20+ min. Pt left in chair position with RN aware and in room.     Exercises Exercises: General Upper Extremity;Other exercises General Exercises - Upper Extremity Shoulder Flexion: AROM;PROM;Both;10 reps Elbow Flexion: PROM;AROM;Both;10 reps Elbow Extension: PROM;AROM;Both;10 reps Digit Composite Flexion: PROM;AROM;Both;10 reps Other Exercises Other Exercises: Worked on shoulder shrugs x 5 with pt able to complete actively bilaterally.    Shoulder Instructions      Home Living Family/patient expects to be discharged to:: Skilled nursing facility                                 Additional Comments: Pt admitted to hospital December 2020 and discharged to a SNF. Prior to December 2020, pt was living at home independently.       Prior Functioning/Environment Level of Independence: Independent        Comments: Prior to December 2020 admission, pt was living alone in a condo, independent with all ADLs, IADLs, and mobility. Pt did not ambulate with an assistive device.         OT Problem List: Decreased strength;Decreased  range of motion;Decreased activity tolerance;Impaired balance (sitting and/or standing);Decreased coordination;Decreased cognition;Decreased safety awareness;Cardiopulmonary status limiting activity;Impaired UE functional use      OT Treatment/Interventions: Self-care/ADL training;Neuromuscular education;Therapeutic exercise;Energy conservation;DME and/or AE instruction;Therapeutic activities;Patient/family education;Balance training    OT Goals(Current goals can be found in the care plan section) Acute Rehab  OT Goals Patient Stated Goal: Pt agreeable to sitting up in bed. Time For Goal Achievement: 09/29/19 Potential to Achieve Goals: Good ADL Goals Pt Will Perform Eating: with min assist;sitting Pt Will Perform Grooming: with min assist;sitting Pt Will Perform Upper Body Bathing: with min assist;sitting Additional ADL Goal #1: Pt to tolerate sitting edge of bed 10 min with min assist, in preparation for ADLs.  OT Frequency: Min 2X/week   Barriers to D/C:            Co-evaluation PT/OT/SLP Co-Evaluation/Treatment: Yes Reason for Co-Treatment: Complexity of the patient's impairments (multi-system involvement);Necessary to address cognition/behavior during functional activity;For patient/therapist safety;To address functional/ADL transfers   OT goals addressed during session: ADL's and self-care;Strengthening/ROM      AM-PAC OT "6 Clicks" Daily Activity     Outcome Measure Help from another person eating meals?: Total Help from another person taking care of personal grooming?: A Lot Help from another person toileting, which includes using toliet, bedpan, or urinal?: Total Help from another person bathing (including washing, rinsing, drying)?: Total Help from another person to put on and taking off regular upper body clothing?: Total Help from another person to put on and taking off regular lower body clothing?: Total 6 Click Score: 7   End of Session Equipment Utilized During Treatment: Oxygen Nurse Communication: Mobility status  Activity Tolerance: Patient tolerated treatment well Patient left: in bed;with nursing/sitter in room;Other (comment)(in chair position)  OT Visit Diagnosis: Unsteadiness on feet (R26.81);Muscle weakness (generalized) (M62.81);Other (comment)(BUE impairments )                Time: 9163-8466 OT Time Calculation (min): 43 min Charges:  OT General Charges $OT Visit: 1 Visit OT Evaluation $OT Eval Moderate Complexity: 1 Mod  Mauri Brooklyn  OTR/L [phone]   Mauri Brooklyn 09/15/2019, 9:31 AM

## 2019-09-15 NOTE — Progress Notes (Signed)
PROGRESS NOTE    Jalyn Dutta  ZHG:[id number] DOB: [birth year] DOA: 09/07/2019 PCP: System, Pcp Not In    Brief Narrative:  74 year old female who presented with dyspnea.  She does have significant past medical history for atrial fibrillation, obesity, type 2 diabetes mellitus, GERD and bipolar disease.  Patient had a recent hospitalization in December 2020 at [facility] for altered mentation and C. difficile colitis.  She was discharged to a skilled nursing facility.  She had a progressive decline in her overall health.  January 17 she was diagnosed with COVID-19.  Her symptoms were consistent with dyspnea and generalized weakness.  24 hours prior to hospitalization she had acute worsening of her symptoms when she arrived at [facility] she had severe respiratory distress and altered mentation, required intubation.  She was admitted to the [facility] campus.  At the time of transfer she was sedated, her lungs had decreased breath sounds bilaterally, heart S1-S2, present rhythmic, her abdomen was protuberant, positive lower extremity edema. Sodium 145, potassium 5.0, chloride 99, bicarb 35, glucose 306, BUN 58, creatinine 1.76, white count 7.7, hemoglobin 13.8, hematocrit 45.3, platelets 223.  Analysis specific gravity 1.024, 100 protein, 0-5 white cells, 0-5 red cells.  Urine culture had more than 100,000 colony-forming units of Klebsiella pneumonia.  Head with atrophy with mild periventricular small vessel disease.  No acute changes.  Chest radiograph with faint interstitial infiltrates at bases bilaterally.  EKG 87 bpm, normal axis, normal intervals, sinus rhythm, no ST segment changes, diffuse negative T waves, predominantly precordial lateral leads V4 to V6.  Patient was admitted to the intensive care unit with a working diagnosis of acute hypoxic respiratory failure due to SARS COVID-19 viral pneumonia.  Patient had mechanical ventilation per ARDS net protocol, used PRVC mode,  received medical therapy with Remdesivir and dexamethasone, diuresis for non cardiogenic pulmonary edema.   Found to have sepsis due to urine infection, end-organ failure metabolic encephalopathy (present on admission).   Patient successfully liberated from mechanical ventilation on 09/14/19 and transferred to [facility] on 09/15/19.   Assessment & Plan:   Principal Problem:   Pneumonia due to COVID-19 virus Active Problems:   AKI (acute kidney injury) (HCC)   Pressure injury of skin   Respiratory failure with hypoxia (HCC)   Acute metabolic encephalopathy   UTI (urinary tract infection), bacterial   1.  Acute hypoxic respiratory failure due to SARS COVID-19 viral pneumonia. #5/5 Remdesivir.   RR: 31 Pulse oxymetry: 92%  Fi02: 3 L/ min per HFNC  COVID-19 Labs  Recent Labs    09/15/19 0720  DDIMER 0.59*  FERRITIN 456*  CRP 7.0*    Lab Results  Component Value Date   SARSCOV2NAA POSITIVE (A) 09/07/2019   Continue medical therapy with dexamethasone #7, antitussive agents, bronchodilators and airway clearing techniques with flutter valve and incentive spirometer.   Physical and occupational therapy evaluation, out of bed to chair tid with meals. Will transfer to telemetry ward.   Continue to follow on inflammatory markers.   Trach aspirate with only few MRSA, wbc 14, blood cultures with no growth, chest film personally reviewed with no air bronchgrams or dense infiltrate. Blood culture with a contaminant coag negative staph. Considering patient had recent C diff, will dc vancomycin and cefepime, ruled out bacterial coinfection. Follow on cell count in am.   Will hold on furosemide for now.   2. Urinary tracy infection due to Klebsiella Pneumonia/ present on admission, patient has completed 7 days of antibiotic  therapy, sepsis has resolved.   3. Uncontrolled T2DM ( Hgb A1c 9,5) complicated with steroid induced hyperglycemia. Fasting glucose is 162 this am, capillary 132, 164 and  217. Patient allowed to eat dysphagia 3 diet, continue close glucose coverage and monitoring with insulin sliding scale. Continue basal therapy with 30 units bid.   4. Chronic atrial fibrillation. Continue rate control with metoprolol at home on 25 mg po bid. Continue anticoagulation with apixaban.   5. HTN. Blood pressure has been stable off antihypertensive agents. At home on amlodipine and lisinopril. Will resume amlodipine but continue to hold on lisinopril to prevent hypotension.   6. Bipolar. Continue with quetiapine and valproic acid. \  7. Multiple pressure uclers. Coccyx, buttocks deep ulcer not able to stage present on admission. Right and Left knee, stage 2 and 3, present on admission. Continue with local wounds care.   DVT prophylaxis: apixaban   Code Status:  full Family Communication: no family at the bedside  Disposition Plan/ discharge barriers: patient continue critically ill, transfer to telemetry ward.    Nutrition Status: Nutrition Problem: Increased nutrient needs Etiology: acute illness, catabolic illness(COVID-19) Signs/Symptoms: estimated needs Interventions: Tube feeding     Skin Documentation: Pressure Injury 09/07/19 Coccyx Unstageable - Full thickness tissue loss in which the base of the injury is covered by slough (yellow, tan, gray, green or brown) and/or eschar (tan, brown or black) in the wound bed. (Active)  09/07/19 1900  Location: Coccyx  Location Orientation:   Staging: Unstageable - Full thickness tissue loss in which the base of the injury is covered by slough (yellow, tan, gray, green or brown) and/or eschar (tan, brown or black) in the wound bed.  Wound Description (Comments):   Present on Admission: Yes     Pressure Injury 09/07/19 Buttocks Bilateral Deep Tissue Pressure Injury - Purple or maroon localized area of discolored intact skin or blood-filled blister due to damage of underlying soft tissue from pressure and/or shear. (Active)    09/07/19 1900  Location: Buttocks  Location Orientation: Bilateral  Staging: Deep Tissue Pressure Injury - Purple or maroon localized area of discolored intact skin or blood-filled blister due to damage of underlying soft tissue from pressure and/or shear.  Wound Description (Comments):   Present on Admission: Yes     Pressure Injury 09/07/19 Knee Right Stage 2 -  Partial thickness loss of dermis presenting as a shallow open injury with a red, pink wound bed without slough. updated per WOC nurse at the time of my assessment/documented at the time of admisson (Active)  09/07/19 1040  Location: Knee  Location Orientation: Right  Staging: Stage 2 -  Partial thickness loss of dermis presenting as a shallow open injury with a red, pink wound bed without slough.  Wound Description (Comments): updated per WOC nurse at the time of my assessment/documented at the time of admisson  Present on Admission: Yes     Pressure Injury 09/07/19 Knee Left Stage 3 -  Full thickness tissue loss. Subcutaneous fat may be visible but bone, tendon or muscle are NOT exposed. updated per WOC nurse at the time of my assessment/documented at the time of admission (Active)  09/07/19 1043  Location: Knee  Location Orientation: Left  Staging: Stage 3 -  Full thickness tissue loss. Subcutaneous fat may be visible but bone, tendon or muscle are NOT exposed.  Wound Description (Comments): updated per WOC nurse at the time of my assessment/documented at the time of admission  Present on Admission: Yes  Subjective: Patient continue to be very weak and deconditioned, no nausea or vomiting, she is asking to drink water. Continue to have dyspnea but has improved in intensity.   Objective: Vitals:   09/15/19 0300 09/15/19 0400 09/15/19 0500 09/15/19 0600  BP: (!) 170/92 (!) 154/63 (!) 145/68 (!) 168/89  Pulse: 86 93 82 90  Resp: (!) 29 (!) 31 (!) 33 20  Temp:  98.8 F (37.1 C)    TempSrc:  Oral    SpO2: 93% 92% 92%  94%  Weight:   106 kg   Height:        Intake/Output Summary (Last 24 hours) at 09/15/2019 0810 Last data filed at 09/15/2019 0600 Gross per 24 hour  Intake 901.12 ml  Output 3245 ml  Net -2343.88 ml   Filed Weights   09/12/19 0500 09/13/19 1954 09/15/19 0500  Weight: 110.8 kg 111.6 kg 106 kg    Examination:   General: deconditioned and ill looking appearing.  Neurology: Awake and alert, non focal  E ENT: mild pallor, no icterus, oral mucosa moist Cardiovascular: No JVD. S1-S2 present, rhythmic, no gallops, rubs, or murmurs. Trace non pitting lower extremity edema. Pulmonary: decreased breath sounds bilaterally, poor air movement, with no wheezing, rhonchi or rales. Gastrointestinal. Abdomen with no organomegaly, non tender, no rebound or guarding Skin. No rashes Musculoskeletal: no joint deformities     Data Reviewed: I have personally reviewed following labs and imaging studies  CBC: Recent Labs  Lab 09/11/19 0135 09/12/19 0115 09/13/19 0310 09/14/19 0430 09/15/19 0720  WBC 10.2 13.1* 14.0* 18.1* 14.2*  HGB 11.7* 11.5* 11.4* 11.5* 12.4  HCT 37.2 36.7 36.3 36.0 38.7  MCV 95.9 95.1 95.0 96.0 96.0  PLT 154 176 185 189 231   Basic Metabolic Panel: Recent Labs  Lab 09/10/19 0509 09/11/19 0135 09/12/19 0115 09/13/19 0310 09/14/19 0430  NA 142 140 141 142 139  K 4.4 4.2 3.9 3.6 4.3  CL 105 99 97* 97* 96*  CO2 29 32 32 32 31  GLUCOSE 249* 318* 328* 223* 310*  BUN 57* 56* 53* 49* 56*  CREATININE 0.77 0.76 0.70 0.58 0.62  CALCIUM 8.2* 8.5* 8.7* 8.8* 8.6*  MG  --   --   --  1.8 2.1  PHOS  --   --   --  4.0 5.1*   GFR: Estimated Creatinine Clearance: 79.4 mL/min (by C-G formula based on SCr of 0.62 mg/dL). Liver Function Tests: Recent Labs  Lab 09/09/19 0455 09/10/19 0509 09/11/19 0135 09/12/19 0115  AST 36 46* 26 18  ALT 31 44 38 31  ALKPHOS 45 129* 143* 132*  BILITOT 0.7 0.5 0.3 0.4  PROT 5.5* 5.3* 5.6* 5.7*  ALBUMIN 2.0* 1.8* 1.9* 1.9*   No  results for input(s): LIPASE, AMYLASE in the last 168 hours. No results for input(s): AMMONIA in the last 168 hours. Coagulation Profile: No results for input(s): INR, PROTIME in the last 168 hours. Cardiac Enzymes: No results for input(s): CKTOTAL, CKMB, CKMBINDEX, TROPONINI in the last 168 hours. BNP (last 3 results) No results for input(s): PROBNP in the last 8760 hours. HbA1C: No results for input(s): HGBA1C in the last 72 hours. CBG: Recent Labs  Lab 09/14/19 1149 09/14/19 1550 09/14/19 2051 09/15/19 0058 09/15/19 0345  GLUCAP 236* 149* 167* 147* 132*   Lipid Profile: No results for input(s): CHOL, HDL, LDLCALC, TRIG, CHOLHDL, LDLDIRECT in the last 72 hours. Thyroid Function Tests: No results for input(s): TSH, T4TOTAL, FREET4, T3FREE, THYROIDAB in the  last 72 hours. Anemia Panel: No results for input(s): VITAMINB12, FOLATE, FERRITIN, TIBC, IRON, RETICCTPCT in the last 72 hours.    Radiology Studies: I have reviewed all of the imaging during this hospital visit personally     Scheduled Meds: . apixaban  5 mg Oral BID  . chlorhexidine  15 mL Mouth/Throat BID  . Chlorhexidine Gluconate Cloth  6 each Topical Daily  . collagenase   Topical Daily  . dexamethasone (DECADRON) injection  6 mg Intravenous Q24H  . furosemide  40 mg Intravenous Daily  . insulin aspart  0-20 Units Subcutaneous Q4H  . insulin detemir  30 Units Subcutaneous BID  . mouth rinse  15 mL Mouth Rinse 10 times per day  . QUEtiapine  250 mg Oral QHS  . valproic acid  500 mg Oral Daily   Continuous Infusions: . ceFEPime (MAXIPIME) IV 2 g (09/15/19 0630)  . vancomycin 150 mL/hr at 09/14/19 2300     LOS: 8 days        Ishanvi Mcquitty Gerome Apley, MD

## 2019-09-15 NOTE — Progress Notes (Addendum)
eLink Physician-Brief Progress Note Patient Name: Becky Hendricks DOB: [birth year] MRN: [id number]   Date of Service  09/15/2019  HPI/Events of Note  Hypertension - BP = 174/93. Patient on Lisinopril and Amlodipine at home. Patient is NPO and OGT D/Ced.  eICU Interventions  Will orderL 1. Hydralazine 10 mg IV Q 4 hours PRN SBP > 170 or DBP > 100.Marland Kitchen      Intervention Category Major Interventions: Hypertension - evaluation and management  Novak Stgermaine Eugene 09/15/2019, 3:00 AM

## 2019-09-16 LAB — GLUCOSE, CAPILLARY
Glucose-Capillary: 291 mg/dL — ABNORMAL HIGH (ref 70–99)
Glucose-Capillary: 170 mg/dL — ABNORMAL HIGH (ref 70–99)
Glucose-Capillary: 334 mg/dL — ABNORMAL HIGH (ref 70–99)
Glucose-Capillary: 226 mg/dL — ABNORMAL HIGH (ref 70–99)
Glucose-Capillary: 227 mg/dL — ABNORMAL HIGH (ref 70–99)

## 2019-09-16 LAB — CBC WITH DIFFERENTIAL/PLATELET
Abs Immature Granulocytes: 0.17 10*3/uL — ABNORMAL HIGH (ref 0.00–0.07)
Basophils Absolute: 0 10*3/uL (ref 0.0–0.1)
Basophils Relative: 0 %
Eosinophils Absolute: 0 10*3/uL (ref 0.0–0.5)
Eosinophils Relative: 0 %
HCT: 38.5 % (ref 36.0–46.0)
Hemoglobin: 12.2 g/dL (ref 12.0–15.0)
Immature Granulocytes: 1 %
Lymphocytes Relative: 13 %
Lymphs Abs: 1.5 10*3/uL (ref 0.7–4.0)
MCH: 30 pg (ref 26.0–34.0)
MCHC: 31.7 g/dL (ref 30.0–36.0)
MCV: 94.6 fL (ref 80.0–100.0)
Monocytes Absolute: 1.2 10*3/uL — ABNORMAL HIGH (ref 0.1–1.0)
Monocytes Relative: 10 %
Neutro Abs: 8.9 10*3/uL — ABNORMAL HIGH (ref 1.7–7.7)
Neutrophils Relative %: 76 %
Platelets: 258 10*3/uL (ref 150–400)
RBC: 4.07 MIL/uL (ref 3.87–5.11)
RDW: 14.2 % (ref 11.5–15.5)
WBC: 11.8 10*3/uL — ABNORMAL HIGH (ref 4.0–10.5)
nRBC: 0 % (ref 0.0–0.2)

## 2019-09-16 LAB — C-REACTIVE PROTEIN: CRP: 3.9 mg/dL — ABNORMAL HIGH (ref <1.0)

## 2019-09-16 LAB — COMPREHENSIVE METABOLIC PANEL
ALT: 26 U/L (ref 0–44)
AST: 21 U/L (ref 15–41)
Albumin: 2.2 g/dL — ABNORMAL LOW (ref 3.5–5.0)
Alkaline Phosphatase: 81 U/L (ref 38–126)
Anion gap: 11 (ref 5–15)
BUN: 41 mg/dL — ABNORMAL HIGH (ref 8–23)
CO2: 31 mmol/L (ref 22–32)
Calcium: 8.8 mg/dL — ABNORMAL LOW (ref 8.9–10.3)
Chloride: 99 mmol/L (ref 98–111)
Creatinine, Ser: 0.52 mg/dL (ref 0.44–1.00)
GFR calc Af Amer: >60 mL/min (ref >60)
GFR calc non Af Amer: >60 mL/min (ref >60)
Glucose, Bld: 201 mg/dL — ABNORMAL HIGH (ref 70–99)
Potassium: 4 mmol/L (ref 3.5–5.1)
Sodium: 141 mmol/L (ref 135–145)
Total Bilirubin: 0.9 mg/dL (ref 0.3–1.2)
Total Protein: 6.1 g/dL — ABNORMAL LOW (ref 6.5–8.1)

## 2019-09-16 LAB — D-DIMER, QUANTITATIVE: D-Dimer, Quant: 0.52 ug/mL-FEU — ABNORMAL HIGH (ref 0.00–0.50)

## 2019-09-16 LAB — FERRITIN: Ferritin: 379 ng/mL — ABNORMAL HIGH (ref 11–307)

## 2019-09-16 MED ORDER — ENSURE ENLIVE PO LIQD
237.0000 mL | Freq: Three times a day (TID) | ORAL | Status: DC
  Administered 2019-09-16 – 2019-09-22 (×18): 237 mL via ORAL

## 2019-09-16 MED ORDER — ADULT MULTIVITAMIN W/MINERALS CH
1.0000 | ORAL_TABLET | Freq: Every day | ORAL | Status: DC
  Administered 2019-09-16 – 2019-09-22 (×7): 1 via ORAL
  Filled 2019-09-16 (×7): qty 1

## 2019-09-16 MED ORDER — LISINOPRIL 10 MG PO TABS
10.0000 mg | ORAL_TABLET | Freq: Every day | ORAL | Status: DC
  Administered 2019-09-16 – 2019-09-22 (×7): 10 mg via ORAL
  Filled 2019-09-16 (×6): qty 1

## 2019-09-16 NOTE — Progress Notes (Signed)
Put patient on a pressure distribution mattress as part of sacral/buttocks wound care/management.

## 2019-09-16 NOTE — Progress Notes (Signed)
Ok to resume lisinopril 10mg  qday per Dr. for BP control.  Nelson Chimes, PharmD, BCIDP, AAHIVP, CPP Infectious Disease Pharmacist 09/16/2019 12:43 PM

## 2019-09-16 NOTE — Progress Notes (Signed)
Occupational Therapy Treatment Patient Details Name: Becky Hendricks MRN: [id number] DOB: 04/01/1946 Today's Date: 09/16/2019    History of present illness 74 y/o F who presented to [facility] on 1/25 with reports of AMS.  She was recently admitted to [facility] in December for AMS & C-Diff Colitis and discharged to a SNF in [place].  Prior to this, she was living independently. she was diagnosed with COVID on 1/17. she become progressively worse with confusion, increased WOB and acutely AMS the 24 hours prior to admit.  She was intubated in the ER for airway protection and transferred to [facility] 1/25 .  Pt extubated on 09/14/19.     OT comments  Pt making slow progress in therapy, continuing to be limited to the bed due to weakness, confusion, and inability to follow instructions. Pt has rectal tube, however noted to be soiled with BM. Pt required total assist x 2 to roll and get cleaned up with max tactile and verbal cues to assist with bed mobility via bedrails. Pt placed in chair position, able to tolerate for 15+ min. Worked on holding onto bedrails with BUEs and maintaining upright posture while back of bed is removed. Pt tolerate sitting upright unsupported by bed for ~2 min. Pt able to reach, grasp, and bring drink to mouth while seated upright with min assist. Pt on room air with SpO2 maintaining in 90s throughout. Pt required continuous redirection to tasks as pt is easily distracted by environment and person conversation. OT will continue to follow acutely.    Follow Up Recommendations  SNF    Equipment Recommendations  Other (comment)(TBD at next venue of care)    Recommendations for Other Services      Precautions / Restrictions Precautions Precautions: Fall;Other (comment) Precaution Comments: Monitor BP. Catheter. Rectal tube. C-diff, Left buttock wound with black eschar Restrictions Weight Bearing Restrictions: No       Mobility Bed Mobility Overal bed mobility: Needs Assistance Bed  Mobility: Rolling Rolling: Total assist;+2 for safety/equipment;+2 for physical assistance         General bed mobility comments: Total assist to roll and weight shift in bed to reposition.   Transfers                 General transfer comment: Not attempted this date due to safety concerns, diarrhea, and malfunction of rectal tube. Pt able to tolerate chair position.     Balance Overall balance assessment: Needs assistance Sitting-balance support: Bilateral upper extremity supported Sitting balance-Leahy Scale: Poor Sitting balance - Comments: Pt able to tolerate chair position in bed 15+ min. Max verbal and tacitle cues to hold onto bedrails. Back of bed lowered with pt continuing to hold onto bedrails and sit upright. Pt tolerated sitting forward without bed support ~2 min.  Postural control: Right lateral lean                                 ADL either performed or assessed with clinical judgement   ADL Overall ADL's : Needs assistance/impaired Eating/Feeding: Minimal assistance Eating/Feeding Details (indicate cue type and reason): to grasp, hold, and bring drink to mouth         Lower Body Bathing: +2 for physical assistance;Total assistance;Bed level   Upper Body Dressing : Bed level;Total assistance           Toileting- Clothing Manipulation and Hygiene: Total assistance;+2 for physical assistance;Bed level  General ADL Comments: Pt noted to be soiled in bed. Worked on rolling to get cleaned up. Pt able to tolerate chair position for 15+ min     Vision       Perception     Praxis      Cognition Arousal/Alertness: Awake/alert Behavior During Therapy: Flat affect;WFL for tasks assessed/performed Overall Cognitive Status: Impaired/Different from baseline Area of Impairment: Orientation;Attention;Memory;Following commands;Safety/judgement;Awareness;Problem solving                 Orientation Level:  Place;Time;Situation Current Attention Level: Focused Memory: Decreased recall of precautions;Decreased short-term memory Following Commands: Follows one step commands inconsistently;Follows one step commands with increased time Safety/Judgement: Decreased awareness of safety;Decreased awareness of deficits Awareness: Intellectual Problem Solving: Slow processing;Decreased initiation;Difficulty sequencing;Requires tactile cues General Comments: Pt easily distracted by environment requiring mod to max redirection back to tasks. Pt does initiate tasks, however becomes distracted and does not complete.          Exercises     Shoulder Instructions       General Comments Pt on room air with SpO2 maintaining in 90s throughout.     Pertinent Vitals/ Pain       Pain Assessment: No/denies pain Faces Pain Scale: Hurts little more Pain Location: periarea with cleaning Pain Descriptors / Indicators: Discomfort Pain Intervention(s): Monitored during session  Home Living                                          Prior Functioning/Environment              Frequency           Progress Toward Goals  OT Goals(current goals can now be found in the care plan section)  Progress towards OT goals: Progressing toward goals  ADL Goals Pt Will Perform Eating: with min assist;sitting Pt Will Perform Grooming: with min assist;sitting Pt Will Perform Upper Body Bathing: with min assist;sitting Additional ADL Goal #1: Pt to tolerate sitting edge of bed 10 min with min assist, in preparation for ADLs.  Plan Discharge plan remains appropriate    Co-evaluation    PT/OT/SLP Co-Evaluation/Treatment: Yes Reason for Co-Treatment: Complexity of the patient's impairments (multi-system involvement);For patient/therapist safety PT goals addressed during session: Mobility/safety with mobility OT goals addressed during session: ADL's and self-care;Strengthening/ROM      AM-PAC  OT "6 Clicks" Daily Activity     Outcome Measure   Help from another person eating meals?: A Little Help from another person taking care of personal grooming?: A Lot Help from another person toileting, which includes using toliet, bedpan, or urinal?: Total Help from another person bathing (including washing, rinsing, drying)?: Total Help from another person to put on and taking off regular upper body clothing?: Total Help from another person to put on and taking off regular lower body clothing?: Total 6 Click Score: 9    End of Session    OT Visit Diagnosis: Unsteadiness on feet (R26.81);Muscle weakness (generalized) (M62.81);Other (comment)(BUE impairments)   Activity Tolerance Patient tolerated treatment well   Patient Left in bed;with call bell/phone within reach;with bed alarm set   Nurse Communication Mobility status        Time: 2841-3244 OT Time Calculation (min): 47 min  Charges: OT General Charges $OT Visit: 1 Visit OT Treatments $Therapeutic Activity: 23-37 mins  Mauri Brooklyn OTR/L [phone]   Mauri Brooklyn 09/16/2019, 12:40  PM

## 2019-09-16 NOTE — Progress Notes (Signed)
PROGRESS NOTE    Becky Hendricks  XNT:[id number] DOB: 1946-01-09 DOA: 09/07/2019 PCP: System, Pcp Not In   Brief Narrative:  74 year old with history of atrial fibrillation, bipolar disorder, DM2, morbid obesity, recent C. difficile infection was brought to the hospital for change in mental status.  She was admitted to [facility] last month diagnosed with C. difficile and eventually sent to SNF in [place].  At the rehab facility progressively worsened with labored breathing therefore brought to the hospital.  She was intubated and transferred to [facility] for further management.  Due to concerns of UTI and bacterial pneumonia she was started on vancomycin and cefepime.  She was also given a course of Rocephin.  Urine cultures grew Klebsiella.  CT head was negative for acute pathology.  She completed treatment of remdesivir.  Eventually extubated on 2/1.  Over the course of several days she was weaned off oxygen.  Eventually physical therapist recommended skilled nursing facility, speech and swallow recommended dysphagia 3 diet.  Case management was consulted.   Assessment & Plan:   Principal Problem:   Pneumonia due to COVID-19 virus Active Problems:   AKI (acute kidney injury) (HCC)   Pressure injury of skin   Respiratory failure with hypoxia (HCC)   Acute metabolic encephalopathy   UTI (urinary tract infection), bacterial  Acute hypoxic respiratory failure secondary to COVID-19 pneumonia -Completed course of remdesivir. -Now patient is on room air -Decadron day 10 -Bronchodilators, incentive spirometer, flutter valve -Inflammatory markers improved -Supportive care.  Sepsis secondary to urinary tract infection/Klebsiella and COVID-19 pneumonia -Improved.  Completed vancomycin and cefepime course -Discontinue Foley catheter  Diabetes mellitus type 2, uncontrolled secondary to hyperglycemia -Hemoglobin A1c 9.5 -Lantus and sliding scale  Chronic atrial fibrillation -Continue Eliquis and  metoprolol 25 mg twice daily  Essential hypertension -Norvasc 5 mg daily, metoprolol 25 mg twice daily  Bipolar disorder -Continue quetiapine and valproic acid  Multiple coccyx ulcers/pressure ulcers -Air mattress  She will require extensive amount of therapy to help her recover to her pre-existing living condition where she was functioning more independently.  DVT prophylaxis: Eliquis Code Status: Full code Family Communication: None at bedside Disposition Plan:   Patient From= SNF, [facility]  Patient Anticipated D/C place= SNF  Barriers= we will need to ensure patient remains off oxygen and stable enough to tolerate therapy.  Eventually will transition patient to skilled nursing facility to get her strength back.  She will require supervision from nursing staff as she is also recovering from her ongoing medical condition.  Consultants:   None    Subjective: Feels extremely weak but in terms of breathing she feels little better.  Review of Systems Otherwise negative except as per HPI, including: General: Denies fever, chills, night sweats or unintended weight loss. Resp: Denies cough, wheezing, shortness of breath. Cardiac: Denies chest pain, palpitations, orthopnea, paroxysmal nocturnal dyspnea. GI: Denies abdominal pain, nausea, vomiting, diarrhea or constipation GU: Denies dysuria, frequency, hesitancy or incontinence MS: Denies muscle aches, joint pain or swelling Neuro: Denies headache, neurologic deficits (focal weakness, numbness, tingling), abnormal gait Psych: Denies anxiety, depression, SI/HI/AVH Skin: Denies new rashes or lesions ID: Denies sick contacts, exotic exposures, travel  Examination:  General exam: Weak and frail appearing, chronically ill-appearing. Respiratory system: Bilateral rhonchi Cardiovascular system: S1 & S2 heard, RRR. No JVD, murmurs, rubs, gallops or clicks. No pedal edema. Gastrointestinal system: Abdomen is nondistended, soft and  nontender. No organomegaly or masses felt. Normal bowel sounds heard. Central nervous system: Alert and oriented. No  focal neurological deficits. Extremities: Strength is 4/5 in all extremities Skin: No rashes, lesions or ulcers Psychiatry: Judgement and insight appear normal. Mood & affect appropriate.     Objective: Vitals:   09/16/19 0000 09/16/19 0500 09/16/19 0523 09/16/19 0743  BP: 102/64  130/74 (!) 169/94  Pulse: (!) 59  82 81  Resp: (!) 22  (!) 21 18  Temp: 98.4 F (36.9 C)  98.1 F (36.7 C) 98.1 F (36.7 C)  TempSrc: Axillary  Oral Oral  SpO2: 95%  90% 98%  Weight:  108.8 kg    Height:        Intake/Output Summary (Last 24 hours) at 09/16/2019 1052 Last data filed at 09/16/2019 0800 Gross per 24 hour  Intake 1040 ml  Output 1025 ml  Net 15 ml   Filed Weights   09/13/19 1954 09/15/19 0500 09/16/19 0500  Weight: 111.6 kg 106 kg 108.8 kg     Data Reviewed:   CBC: Recent Labs  Lab 09/12/19 0115 09/13/19 0310 09/14/19 0430 09/15/19 0720 09/16/19 0157  WBC 13.1* 14.0* 18.1* 14.2* 11.8*  NEUTROABS  --   --   --   --  8.9*  HGB 11.5* 11.4* 11.5* 12.4 12.2  HCT 36.7 36.3 36.0 38.7 38.5  MCV 95.1 95.0 96.0 96.0 94.6  PLT 176 185 189 231 350   Basic Metabolic Panel: Recent Labs  Lab 09/12/19 0115 09/13/19 0310 09/14/19 0430 09/15/19 0720 09/16/19 0157  NA 141 142 139 141 141  K 3.9 3.6 4.3 3.8 4.0  CL 97* 97* 96* 101 99  CO2 32 32 31 30 31   GLUCOSE 328* 223* 310* 162* 201*  BUN 53* 49* 56* 40* 41*  CREATININE 0.70 0.58 0.62 0.53 0.52  CALCIUM 8.7* 8.8* 8.6* 8.5* 8.8*  MG  --  1.8 2.1  --   --   PHOS  --  4.0 5.1*  --   --    GFR: Estimated Creatinine Clearance: 80.5 mL/min (by C-G formula based on SCr of 0.52 mg/dL). Liver Function Tests: Recent Labs  Lab 09/10/19 0509 09/11/19 0135 09/12/19 0115 09/16/19 0157  AST 46* 26 18 21   ALT 44 38 31 26  ALKPHOS 129* 143* 132* 81  BILITOT 0.5 0.3 0.4 0.9  PROT 5.3* 5.6* 5.7* 6.1*  ALBUMIN 1.8*  1.9* 1.9* 2.2*   No results for input(s): LIPASE, AMYLASE in the last 168 hours. No results for input(s): AMMONIA in the last 168 hours. Coagulation Profile: No results for input(s): INR, PROTIME in the last 168 hours. Cardiac Enzymes: No results for input(s): CKTOTAL, CKMB, CKMBINDEX, TROPONINI in the last 168 hours. BNP (last 3 results) No results for input(s): PROBNP in the last 8760 hours. HbA1C: No results for input(s): HGBA1C in the last 72 hours. CBG: Recent Labs  Lab 09/15/19 0756 09/15/19 1146 09/15/19 1638 09/15/19 2039 09/16/19 0752  GLUCAP 164* 217* 253* 291* 170*   Lipid Profile: No results for input(s): CHOL, HDL, LDLCALC, TRIG, CHOLHDL, LDLDIRECT in the last 72 hours. Thyroid Function Tests: No results for input(s): TSH, T4TOTAL, FREET4, T3FREE, THYROIDAB in the last 72 hours. Anemia Panel: Recent Labs    09/15/19 0720 09/16/19 0157  FERRITIN 456* 379*   Sepsis Labs: No results for input(s): PROCALCITON, LATICACIDVEN in the last 168 hours.  Recent Results (from the past 240 hour(s))  Blood Culture (routine x 2)     Status: None   Collection Time: 09/07/19  9:05 AM   Specimen: BLOOD  Result Value Ref  Range Status   Specimen Description BLOOD LEFT FA  Final   Special Requests   Final    BOTTLES DRAWN AEROBIC AND ANAEROBIC Blood Culture adequate volume   Culture   Final    NO GROWTH 5 DAYS Performed at [facility], [address] [place], [place] [address]    Report Status 09/12/2019 FINAL  Final  Blood Culture (routine x 2)     Status: None   Collection Time: 09/07/19  9:05 AM   Specimen: BLOOD  Result Value Ref Range Status   Specimen Description BLOOD RIGHT [facility]  Final   Special Requests   Final    BOTTLES DRAWN AEROBIC AND ANAEROBIC Blood Culture adequate volume   Culture   Final    NO GROWTH 5 DAYS Performed at [facility], [address] [place], [place] [address]    Report Status 09/12/2019 FINAL  Final  Urine  culture     Status: Abnormal   Collection Time: 09/07/19 11:19 AM   Specimen: In/Out Cath Urine  Result Value Ref Range Status   Specimen Description   Final    IN/OUT CATH URINE Performed at [facility], [address], [place], [place] [address]    Special Requests   Final    NONE Performed at [facility], [address], [place], [place] [address]    Culture >=100,000 COLONIES/mL KLEBSIELLA PNEUMONIAE (A)  Final   Report Status 09/09/2019 FINAL  Final   Organism ID, Bacteria KLEBSIELLA PNEUMONIAE (A)  Final      Susceptibility   Klebsiella pneumoniae - MIC*    AMPICILLIN RESISTANT Resistant     CEFAZOLIN <=4 SENSITIVE Sensitive     CEFTRIAXONE <=0.25 SENSITIVE Sensitive     CIPROFLOXACIN <=0.25 SENSITIVE Sensitive     GENTAMICIN <=1 SENSITIVE Sensitive     IMIPENEM <=0.25 SENSITIVE Sensitive     NITROFURANTOIN <=16 SENSITIVE Sensitive     TRIMETH/SULFA <=20 SENSITIVE Sensitive     AMPICILLIN/SULBACTAM <=2 SENSITIVE Sensitive     PIP/TAZO <=4 SENSITIVE Sensitive     * >=100,000 COLONIES/mL KLEBSIELLA PNEUMONIAE  Respiratory Panel by RT PCR (Flu A&B, Covid) - Nasopharyngeal Swab     Status: Abnormal   Collection Time: 09/07/19  4:17 PM   Specimen: Nasopharyngeal Swab  Result Value Ref Range Status   SARS Coronavirus 2 by RT PCR POSITIVE (A) NEGATIVE Final    Comment: RESULT CALLED TO, READ BACK BY AND VERIFIED WITH: SU PALMER @1757  09/07/19 MJU (NOTE) SARS-CoV-2 target nucleic acids are DETECTED. SARS-CoV-2 RNA is generally detectable in upper respiratory specimens  during the acute phase of infection. Positive results are indicative of the presence of the identified virus, but do not rule out bacterial infection or co-infection with other pathogens not detected by the test. Clinical correlation with patient history and other diagnostic information is necessary to determine patient infection status. The expected result is Negative. Fact Sheet for  Patients:  09/09/19 Fact Sheet for Healthcare Providers: https://www.moore.com/ This test is not yet approved or cleared by the https://www.young.biz/ FDA and  has been authorized for detection and/or diagnosis of SARS-CoV-2 by FDA under an Emergency Use Authorization (EUA).  This EUA will remain in effect (meaning this test can be used) for the d uration of  the COVID-19 declaration under Section 564(b)(1) of the Act, 21 U.S.C. section 360bbb-3(b)(1), unless the authorization is terminated or revoked sooner.    Influenza A by PCR NEGATIVE NEGATIVE Final   Influenza B  by PCR NEGATIVE NEGATIVE Final    Comment: (NOTE) The Xpert Xpress SARS-CoV-2/FLU/RSV assay is intended as an aid in  the diagnosis of influenza from Nasopharyngeal swab specimens and  should not be used as a sole basis for treatment. Nasal washings and  aspirates are unacceptable for Xpert Xpress SARS-CoV-2/FLU/RSV  testing. Fact Sheet for Patients: https://www.moore.com/ Fact Sheet for Healthcare Providers: https://www.young.biz/ This test is not yet approved or cleared by the [place] FDA and  has been authorized for detection and/or diagnosis of SARS-CoV-2 by  FDA under an Emergency Use Authorization (EUA). This EUA will remain  in effect (meaning this test can be used) for the duration of the  Covid-19 declaration under Section 564(b)(1) of the Act, 21  U.S.C. section 360bbb-3(b)(1), unless the authorization is  terminated or revoked. Performed at [facility], [address] [place], [place] [address]   MRSA PCR Screening     Status: None   Collection Time: 09/07/19 11:39 PM   Specimen: Nasopharyngeal  Result Value Ref Range Status   MRSA by PCR NEGATIVE NEGATIVE Final    Comment:        The GeneXpert MRSA Assay (FDA approved for NASAL specimens only), is one component of a comprehensive MRSA  colonization surveillance program. It is not intended to diagnose MRSA infection nor to guide or monitor treatment for MRSA infections. Performed at [facility], [address], [place], [place] [address]   Culture, blood (Routine X 2) w Reflex to ID Panel     Status: Abnormal   Collection Time: 09/08/19  1:00 PM   Specimen: BLOOD  Result Value Ref Range Status   Specimen Description BLOOD SITE NOT SPECIFIED  Final   Special Requests   Final    BOTTLES DRAWN AEROBIC ONLY Blood Culture results may not be optimal due to an inadequate volume of blood received in culture bottles   Culture  Setup Time   Final    GRAM POSITIVE COCCI IN CLUSTERS AEROBIC BOTTLE ONLY CRITICAL RESULT CALLED TO, READ BACK BY AND VERIFIED WITH: PHARMD ERIN WILLIAMSON [phone] FCP    Culture (A)  Final    STAPHYLOCOCCUS SPECIES (COAGULASE NEGATIVE) THE SIGNIFICANCE OF ISOLATING THIS ORGANISM FROM A SINGLE SET OF BLOOD CULTURES WHEN MULTIPLE SETS ARE DRAWN IS UNCERTAIN. PLEASE NOTIFY THE MICROBIOLOGY DEPARTMENT WITHIN ONE WEEK IF SPECIATION AND SENSITIVITIES ARE REQUIRED. Performed at [facility], [address], [place], [place] [address]    Report Status 09/11/2019 FINAL  Final  Culture, blood (Routine X 2) w Reflex to ID Panel     Status: None   Collection Time: 09/08/19  1:00 PM   Specimen: BLOOD  Result Value Ref Range Status   Specimen Description BLOOD SITE NOT SPECIFIED  Final   Special Requests   Final    BOTTLES DRAWN AEROBIC ONLY Blood Culture adequate volume   Culture   Final    NO GROWTH 5 DAYS Performed at [facility], [address] [place], [place] [address]    Report Status 09/13/2019 FINAL  Final  Culture, blood (Routine X 2) w Reflex to ID Panel     Status: None (Preliminary result)   Collection Time: 09/12/19  5:13 PM   Specimen: BLOOD LEFT FOREARM  Result Value Ref Range Status   Specimen Description BLOOD LEFT FOREARM  Final   Special Requests    Final    BOTTLES DRAWN AEROBIC ONLY Blood Culture adequate volume Performed at  [facility], [address] [place], [place] [address]    Culture NO GROWTH 2 DAYS  Final   Report Status PENDING  Incomplete  Culture, Urine     Status: None   Collection Time: 09/12/19  5:14 PM   Specimen: Urine, Catheterized  Result Value Ref Range Status   Specimen Description   Final    URINE, CATHETERIZED Performed at [facility], [address] [place], [place] [address]    Special Requests   Final    NONE Performed at [facility], [address] [place], [place] [address]    Culture   Final    NO GROWTH Performed at [facility], [address] [place], [place] [address]    Report Status 09/14/2019 FINAL  Final  Culture, respiratory (non-expectorated)     Status: None (Preliminary result)   Collection Time: 09/12/19  5:14 PM   Specimen: Tracheal Aspirate; Respiratory  Result Value Ref Range Status   Specimen Description   Final    TRACHEAL ASPIRATE Performed at [facility], [address] [place], [place] [address]    Special Requests   Final    NONE Performed at [facility], [address] [place], [place] [address]    Gram Stain   Final    MODERATE WBC PRESENT, PREDOMINANTLY PMN FEW GRAM NEGATIVE RODS FEW GRAM POSITIVE COCCI IN CLUSTERS RARE GRAM POSITIVE RODS    Culture   Final    FEW METHICILLIN RESISTANT STAPHYLOCOCCUS AUREUS CULTURE REINCUBATED FOR BETTER GROWTH Performed at [facility], [address] [place], [place] [address]    Report Status PENDING  Incomplete   Organism ID, Bacteria METHICILLIN RESISTANT STAPHYLOCOCCUS AUREUS  Final      Susceptibility   Methicillin resistant staphylococcus aureus - MIC*    CIPROFLOXACIN >=8 RESISTANT Resistant     ERYTHROMYCIN >=8 RESISTANT Resistant     GENTAMICIN <=0.5 SENSITIVE Sensitive     OXACILLIN >=4  RESISTANT Resistant     TETRACYCLINE <=1 SENSITIVE Sensitive     VANCOMYCIN 1 SENSITIVE Sensitive     TRIMETH/SULFA >=320 RESISTANT Resistant     CLINDAMYCIN <=0.25 SENSITIVE Sensitive     RIFAMPIN <=0.5 SENSITIVE Sensitive     Inducible Clindamycin NEGATIVE Sensitive     * FEW METHICILLIN RESISTANT STAPHYLOCOCCUS AUREUS  Culture, blood (Routine X 2) w Reflex to ID Panel     Status: None (Preliminary result)   Collection Time: 09/12/19  5:18 PM   Specimen: BLOOD  Result Value Ref Range Status   Specimen Description BLOOD RIGHT ANTECUBITAL  Final   Special Requests   Final    BOTTLES DRAWN AEROBIC ONLY Blood Culture adequate volume Performed at [facility], [address] [place], [place] [address]    Culture NO GROWTH 2 DAYS  Final   Report Status PENDING  Incomplete         Radiology Studies: No results found.      Scheduled Meds: . amLODipine  5 mg Oral Daily  . apixaban  5 mg Oral BID  . chlorhexidine  15 mL Mouth/Throat BID  . Chlorhexidine Gluconate Cloth  6 each Topical Daily  . chlorpheniramine-HYDROcodone  5 mL Oral Q12H  . dexamethasone (DECADRON) injection  6 mg Intravenous Q24H  . insulin aspart  0-15 Units Subcutaneous TID WC  . insulin aspart  0-5 Units Subcutaneous QHS  . insulin detemir  30 Units Subcutaneous  BID  . metoprolol tartrate  25 mg Oral BID  . QUEtiapine  250 mg Oral QHS  . valproic acid  500 mg Oral Daily   Continuous Infusions:   LOS: 9 days   Time spent= 35 mins    Viann Nielson Joline Maxcyhirag Nadean Montanaro, MD [facility]  If 7PM-7AM, please contact night-coverage  09/16/2019, 10:52 AM

## 2019-09-16 NOTE — Progress Notes (Signed)
Uneventful shift. Patient remains pleasantly confused. A&O x 1-2.No falls or injuries this shift. No cardiac or respiratory issues this shift. Patient continues with contact precautions and rectal tube/pouch for C-Diff. Placed on a specialty mattress for sacral and buttocks wounds.  Call bell within reach. Will continue to monitor.

## 2019-09-16 NOTE — Progress Notes (Signed)
  Speech Language Pathology Treatment: Dysphagia  Patient Details Name: Becky Hendricks MRN: [id number] DOB: 08-08-1946 Today's Date: 09/16/2019 Time: 8119-1478 SLP Time Calculation (min) (ACUTE ONLY): 8 min  Assessment / Plan / Recommendation Clinical Impression  Pt continues to present with low, breathy vocal quality, increasing with verbal cues for awareness. No signs of aspiration with thin liquids. Pt refused snack, but RN reports she continues to need total assist feeding. Pt demonstrates ongoing delirium (short term memory impairment, decreased awareness, confabulation etc). Pt to continue mechanical soft diet this week. Will f/u next week for potential upgrade if strength and mentation have improved and pt is able to demonstrate independent feeding.   HPI HPI: 74 y/o F who presented to [facility] on 1/25 with reports of AMS with recent admission to [facility] 12/20 for AMS & C-Diff Colitis and discharged to a SNF in [place]. PMH; DM, morbid obesity, essential HTN, bipolar, A-fib. Prior to this, she was living independently. she was diagnosed with COVID on 1/17. she become progressively worse with confusion, increased WOB. intubated 1/25-09/14/19. Transferred [facility] 1/25      SLP Plan          Recommendations  Diet recommendations: Dysphagia 3 (mechanical soft);Thin liquid Liquids provided via: Cup Medication Administration: Whole meds with puree Supervision: Patient able to self feed Compensations: Slow rate;Small sips/bites Postural Changes and/or Swallow Maneuvers: Seated upright 90 degrees                Oral Care Recommendations: Oral care BID Follow up Recommendations: None SLP Visit Diagnosis: Dysphagia, unspecified (R13.10)       GO               Harlon Ditty, MA CCC-SLP  Acute Rehabilitation Services Pager [phone] Office [phone]  Claudine Mouton 09/16/2019, 11:00 AM

## 2019-09-16 NOTE — Progress Notes (Signed)
Updated brother on patient's current condition.

## 2019-09-16 NOTE — Progress Notes (Signed)
Inpatient Diabetes Program Recommendations  AACE/ADA: New Consensus Statement on Inpatient Glycemic Control   Target Ranges:  Prepandial:   less than 140 mg/dL      Peak postprandial:   less than 180 mg/dL (1-2 hours)      Critically ill patients:  140 - 180 mg/dL   Results for SHABREE, TEBBETTS (MRN [id number]) as of 09/16/2019 11:35  Ref. Range 09/15/2019 07:56 09/15/2019 11:46 09/15/2019 16:38 09/15/2019 20:39 09/16/2019 07:52  Glucose-Capillary Latest Ref Range: 70 - 99 mg/dL 009 (H) 233 (H) 007 (H) 291 (H) 170 (H)   Review of Glycemic Control  Current orders for Inpatient glycemic control: Levemir 30 units BID, Novolog 0-15 units TID with meals, Novolog 0-5 units QHS; Decadron 6mg  Q24H  Inpatient Diabetes Program Recommendations:   Insulin - Meal Coverage: Please consider ordering Novolog 4 units TID with meals for meal coverage if patient eats at least 50% of meals.  Thanks, , RN, MSN, CDE Diabetes Coordinator Inpatient Diabetes Program [phone] (Team Pager from 8am to 5pm)

## 2019-09-16 NOTE — Progress Notes (Signed)
Physical Therapy Treatment Patient Details Name: Becky Hendricks MRN: [id number] DOB: [birth year] Today's Date: 09/16/2019    History of Present Illness 74 y/o F who presented to [facility] on 1/25 with reports of AMS.  She was recently admitted to [facility] in December for AMS & C-Diff Colitis and discharged to a SNF in [place].  Prior to this, she was living independently. she was diagnosed with COVID on 1/17. she become progressively worse with confusion, increased WOB and acutely AMS the 24 hours prior to admit.  She was intubated in the ER for airway protection and transferred to [facility] 1/25 .  Pt extubated on 09/14/19.      PT Comments    The patient  Is pleasantly confused. Has a large wound on buttock. Recommend air mattress for bed.. Nursing  Notified. The patient continues with loose BM so mobility OOB is not recommended as well as large pressure wound on buttock. Patient tolerates sitting upright in bed/chair position very well. Continue PT for progressive mobility.     Follow Up Recommendations  SNF     Equipment Recommendations  None recommended by PT    Recommendations for Other Services       Precautions / Restrictions Precautions Precaution Comments: Monitor BP. Catheter. Rectal tube. C-diff, Left buttoc wound with black eschar    Mobility  Bed Mobility   Bed Mobility: Rolling Rolling: Total assist;+2 for safety/equipment;+2 for physical assistance         General bed mobility comments: Total assist to weight shift in bed to reposition with multimodal cue to reach for rail s.  Transfers                 General transfer comment: Not attempted this date due to safety concerns  and diarrhea/rectal tube Pt able to tolerate chair position.  Ambulation/Gait                 Stairs             Wheelchair Mobility    Modified Rankin (Stroke Patients Only)       Balance Overall balance assessment: Needs assistance Sitting-balance support: Bilateral upper  extremity supported Sitting balance-Leahy Scale: Poor Sitting balance - Comments: placed in bed/chair position with bed tilted forward. Assisted patient  to hold both rails and pull self forward. Once  sitting forward, back of bed moved away to offer no support. Patient did remain sitting forward for about 1.5 minutes. Postural control: Right lateral lean                                  Cognition Arousal/Alertness: Awake/alert Behavior During Therapy: Flat affect;WFL for tasks assessed/performed Overall Cognitive Status: Impaired/Different from baseline Area of Impairment: Orientation;Attention;Memory;Following commands;Safety/judgement;Awareness;Problem solving                 Orientation Level: Disoriented to;Time;Situation Current Attention Level: Focused Memory: Decreased recall of precautions;Decreased short-term memory Following Commands: Follows one step commands inconsistently;Follows one step commands with increased time Safety/Judgement: Decreased awareness of safety;Decreased awareness of deficits Awareness: Intellectual Problem Solving: Slow processing;Decreased initiation;Requires verbal cues;Requires tactile cues General Comments: Pt easily distracted by environment requiring mod to max redirection back to tasks. Pt does initiate tasks, however becomes distracted and does not complete.        Exercises      General Comments        Pertinent Vitals/Pain Pain Assessment: Faces Faces Pain  Scale: Hurts little more Pain Location: periarea with cleaning Pain Descriptors / Indicators: Discomfort Pain Intervention(s): Monitored during session    Home Living                      Prior Function            PT Goals (current goals can now be found in the care plan section) Progress towards PT goals: Progressing toward goals    Frequency    Min 2X/week      PT Plan Current plan remains appropriate    Co-evaluation PT/OT/SLP  Co-Evaluation/Treatment: Yes Reason for Co-Treatment: For patient/therapist safety PT goals addressed during session: Mobility/safety with mobility OT goals addressed during session: ADL's and self-care      AM-PAC PT "6 Clicks" Mobility   Outcome Measure  Help needed turning from your back to your side while in a flat bed without using bedrails?: Total Help needed moving from lying on your back to sitting on the side of a flat bed without using bedrails?: Total Help needed moving to and from a bed to a chair (including a wheelchair)?: Total Help needed standing up from a chair using your arms (e.g., wheelchair or bedside chair)?: Total Help needed to walk in hospital room?: Total Help needed climbing 3-5 steps with a railing? : Total 6 Click Score: 6    End of Session Equipment Utilized During Treatment: Oxygen Activity Tolerance: Patient tolerated treatment well Patient left: in bed;with call bell/phone within reach;with bed alarm set Nurse Communication: Mobility status;Need for lift equipment PT Visit Diagnosis: Unsteadiness on feet (R26.81);Muscle weakness (generalized) (M62.81)     Time: 2563-8937 PT Time Calculation (min) (ACUTE ONLY): 51 min  Charges:  $Therapeutic Activity: 23-37 mins                     Tresa Endo PT Acute Rehabilitation Services  Office [phone]    Claretha Cooper 09/16/2019, 12:09 PM

## 2019-09-16 NOTE — TOC Initial Note (Addendum)
Transition of Care [facility]) - Initial/Assessment Note    Patient Details  Name: Becky Hendricks MRN: [id number] Date of Birth: Apr 02, 1946  Transition of Care [facility]) CM/SW Contact:    Golda Acre, RN Phone Number: 09/16/2019, 10:04 AM  Clinical Narrative:                 Present plan is for patient [place] to snf for rehab.  fl2 sent out to area and surrounding areas for review.  Will call spouse for choices once rec'd. Requested information for passar number faxed to [phone]. Expected Discharge Plan: Skilled Nursing Facility Barriers to Discharge: Continued Medical Work up, SNF Pending bed offer   Patient Goals and CMS Choice     Choice offered to / list presented to : Spouse  Expected Discharge Plan and Services Expected Discharge Plan: Skilled Nursing Facility   Discharge Planning Services: CM Consult Post Acute Care Choice: Skilled Nursing Facility Living arrangements for the past 2 months: Single Family Home                                      Prior Living Arrangements/Services Living arrangements for the past 2 months: Single Family Home Lives with:: Spouse Patient language and need for interpreter reviewed:: No Do you feel safe going back to the place where you live?: Yes      Need for Family Participation in Patient Care: Yes (Comment) Care giver support system in place?: Yes (comment)   Criminal Activity/Legal Involvement Pertinent to Current Situation/Hospitalization: No - Comment as needed  Activities of Daily Living Home Assistive Devices/Equipment: Hospital bed(unsure of what is used at SNF) ADL Screening (condition at time of admission) Patient's cognitive ability adequate to safely complete daily activities?: Yes Is the patient deaf or have difficulty hearing?: No Does the patient have difficulty seeing, even when wearing glasses/contacts?: No Does the patient have difficulty concentrating, remembering, or making decisions?: No Patient able to  express need for assistance with ADLs?: Yes Does the patient have difficulty dressing or bathing?: Yes Independently performs ADLs?: No Does the patient have difficulty walking or climbing stairs?: Yes Weakness of Legs: Both Weakness of Arms/Hands: Both  Permission Sought/Granted                  Emotional Assessment Appearance:: Appears stated age     Orientation: : Oriented to Self, Oriented to Place, Oriented to  Time, Oriented to Situation Alcohol / Substance Use: Not Applicable Psych Involvement: No (comment)  Admission diagnosis:  Acute respiratory distress syndrome (ARDS) due to COVID-19 virus (HCC) [U07.1, J80] Patient Active Problem List   Diagnosis Date Noted  . Respiratory failure with hypoxia (HCC) 09/15/2019  . Pneumonia due to COVID-19 virus 09/15/2019  . Acute metabolic encephalopathy 09/15/2019  . UTI (urinary tract infection), bacterial 09/15/2019  . Pressure injury of skin 09/10/2019  . AKI (acute kidney injury) (HCC)   . Acute respiratory distress syndrome (ARDS) due to COVID-19 virus (HCC) 09/07/2019   PCP:  System, Pcp Not In Pharmacy:  No Pharmacies Listed    Social Determinants of Health (SDOH) Interventions    Readmission Risk Interventions No flowsheet data found.

## 2019-09-16 NOTE — NC FL2 (Signed)
[place] MEDICAID FL2 LEVEL OF CARE SCREENING TOOL     IDENTIFICATION  Patient Name: Becky Hendricks Birthdate: 1945-10-14 Sex: female Admission Date (Current Location): 09/07/2019  [facility] and [place] Number:  Producer, television/film/video and Address:  [facility],  [address] [place] [address]      Provider Number: [id number]  Attending Physician Name and Address:  Dimple Nanas, MD  Relative Name and Phone Number:       Current Level of Care: Hospital Recommended Level of Care: Skilled Nursing Facility Prior Approval Number:    Date Approved/Denied:   PASRR Number:    Discharge Plan: SNF    Current Diagnoses: Patient Active Problem List   Diagnosis Date Noted  . Respiratory failure with hypoxia (HCC) 09/15/2019  . Pneumonia due to COVID-19 virus 09/15/2019  . Acute metabolic encephalopathy 09/15/2019  . UTI (urinary tract infection), bacterial 09/15/2019  . Pressure injury of skin 09/10/2019  . AKI (acute kidney injury) (HCC)   . Acute respiratory distress syndrome (ARDS) due to COVID-19 virus (HCC) 09/07/2019    Orientation RESPIRATION BLADDER Height & Weight     Self, Time, Situation, Place  Normal Continent Weight: 108.8 kg Height:  5\' 6"  (167.6 cm)  BEHAVIORAL SYMPTOMS/MOOD NEUROLOGICAL BOWEL NUTRITION STATUS      Continent Diet(regular)  AMBULATORY STATUS COMMUNICATION OF NEEDS Skin   Extensive Assist Verbally Normal                       Personal Care Assistance Level of Assistance  Bathing, Feeding, Dressing Bathing Assistance: Limited assistance Feeding assistance: Limited assistance Dressing Assistance: Limited assistance     Functional Limitations Info  Sight, Hearing, Speech Sight Info: Adequate Hearing Info: Adequate Speech Info: Adequate    SPECIAL CARE FACTORS FREQUENCY  PT (By licensed PT), OT (By licensed OT)     PT Frequency: pt 5xweekly OT Frequency: ot-5xweekly            Contractures Contractures  Info: Not present    Additional Factors Info  Code Status Code Status Info: full             Current Medications (09/16/2019):  This is the current hospital active medication list Current Facility-Administered Medications  Medication Dose Route Frequency Provider Last Rate Last Admin  . acetaminophen (TYLENOL) 160 MG/5ML solution 650 mg  650 mg Oral Q4H PRN Ollis, Brandi L, NP      . amLODipine (NORVASC) tablet 5 mg  5 mg Oral Daily Arrien, 11/14/2019, MD   5 mg at 09/15/19 1417  . apixaban (ELIQUIS) tablet 5 mg  5 mg Oral BID 11/13/19 L, NP   5 mg at 09/15/19 2033  . chlorhexidine (PERIDEX) 0.12 % solution 15 mL  15 mL Mouth/Throat BID 2034, MD   15 mL at 09/16/19 0801  . Chlorhexidine Gluconate Cloth 2 % PADS 6 each  6 each Topical Daily 11/14/19, DO   6 each at 09/15/19 [phone]  . chlorpheniramine-HYDROcodone (TUSSIONEX) 10-8 MG/5ML suspension 5 mL  5 mL Oral Q12H Arrien, 8299, MD   5 mL at 09/15/19 1422  . collagenase (SANTYL) ointment   Topical Daily 11/13/19, DO   Given at 09/15/19 11/13/19  . dexamethasone (DECADRON) injection 6 mg  6 mg Intravenous Q24H Ollis, Brandi L, NP   6 mg at 09/15/19 0913  . guaiFENesin-dextromethorphan (ROBITUSSIN DM) 100-10 MG/5ML syrup 5 mL  5 mL Oral Q4H PRN Arrien,  Jimmy Picket, MD      . hydrALAZINE (APRESOLINE) injection 10 mg  10 mg Intravenous Q4H PRN Anders Simmonds, MD   10 mg at 09/15/19 0347  . insulin aspart (novoLOG) injection 0-15 Units  0-15 Units Subcutaneous TID WC Opyd, Ilene Qua, MD   3 Units at 09/16/19 0801  . insulin aspart (novoLOG) injection 0-5 Units  0-5 Units Subcutaneous QHS Vianne Bulls, MD   3 Units at 09/15/19 2133  . insulin detemir (LEVEMIR) injection 30 Units  30 Units Subcutaneous BID Rigoberto Noel, MD   30 Units at 09/15/19 2134  . metoprolol tartrate (LOPRESSOR) tablet 25 mg  25 mg Oral BID Tawni Millers, MD   25 mg at 09/15/19 1637  . QUEtiapine (SEROQUEL)  tablet 250 mg  250 mg Oral QHS Ollis, Brandi L, NP   250 mg at 09/15/19 2033  . valproic acid (DEPAKENE) 250 MG/5ML solution 500 mg  500 mg Oral Daily Donita Brooks, NP         Discharge Medications: Please see discharge summary for a list of discharge medications.  Relevant Imaging Results:  Relevant Lab Results:   Additional Information [phone]  Leeroy Cha, RN

## 2019-09-16 NOTE — Progress Notes (Addendum)
Nutrition Follow-up  DOCUMENTATION CODES:   Obesity unspecified  INTERVENTION:    Ensure Enlive po TID, each supplement provides 350 kcal and 20 grams of protein.   MVI with minerals daily.   Pt receiving Hormel Shake daily with Breakfast which provides 520 kcals and 22 g of protein and Magic cup BID with lunch and dinner, each supplement provides 290 kcal and 9 grams of protein, automatically on meal trays to optimize nutritional intake.   NUTRITION DIAGNOSIS:   Increased nutrient needs related to acute illness, catabolic illness(COVID-19) as evidenced by estimated needs.  Ongoing   GOAL:   Patient will meet greater than or equal to 90% of their needs  Unmet  MONITOR:   PO intake, Supplement acceptance, Labs, Skin  REASON FOR ASSESSMENT:   Ventilator, Consult Enteral/tube feeding initiation and management  ASSESSMENT:   74 yo female admitted from SNF with AMS and required intubation. Diagnosed with COVID-19 on 1/17. PMH includes A fib, C. Diff colitis 08/02/2019, Bipolar D/O, morbid obesity, HTN, DM-2.  Patient was extubated 2/1. Diet has been advanced to dysphagia 3 with thin liquids. SLP following. Patient is requiring total assistance with feeding. She consumed 50% of breakfast and 25% of lunch today.  Labs reviewed.  CBG's: 291-170 Medications reviewed and include decadron, novolog, levemir.   Diet Order:   Diet Order            DIET DYS 3 Room service appropriate? Yes; Fluid consistency: Thin  Diet effective now              EDUCATION NEEDS:   Not appropriate for education at this time  Skin:  Skin Assessment: Skin Integrity Issues: Skin Integrity Issues:: Stage III, Stage II, Unstageable DTI: buttocks Stage II: R knee Stage III: L knee Unstageable: sacrum / R buttock  Last BM:  2/3 rectal tube  Height:   Ht Readings from Last 1 Encounters:  09/07/19 5\' 6"  (1.676 m)    Weight:   Wt Readings from Last 1 Encounters:  09/16/19 108.8  kg    Ideal Body Weight:  59.1 kg  BMI:  Body mass index is 38.71 kg/m.  Estimated Nutritional Needs:   Kcal:  2000-2200  Protein:  110-130 gm  Fluid:  >/= 1.8 L    11/14/19, RD, LDN, CNSC Pager [phone] After Hours Pager [phone]

## 2019-09-17 LAB — FERRITIN: Ferritin: 413 ng/mL — ABNORMAL HIGH (ref 11–307)

## 2019-09-17 LAB — COMPREHENSIVE METABOLIC PANEL
ALT: 33 U/L (ref 0–44)
AST: 25 U/L (ref 15–41)
Albumin: 2.4 g/dL — ABNORMAL LOW (ref 3.5–5.0)
Alkaline Phosphatase: 84 U/L (ref 38–126)
Anion gap: 14 (ref 5–15)
BUN: 38 mg/dL — ABNORMAL HIGH (ref 8–23)
CO2: 29 mmol/L (ref 22–32)
Calcium: 8.9 mg/dL (ref 8.9–10.3)
Chloride: 98 mmol/L (ref 98–111)
Creatinine, Ser: 0.61 mg/dL (ref 0.44–1.00)
GFR calc Af Amer: >60 mL/min (ref >60)
GFR calc non Af Amer: >60 mL/min (ref >60)
Glucose, Bld: 198 mg/dL — ABNORMAL HIGH (ref 70–99)
Potassium: 4.3 mmol/L (ref 3.5–5.1)
Sodium: 141 mmol/L (ref 135–145)
Total Bilirubin: 1 mg/dL (ref 0.3–1.2)
Total Protein: 6.4 g/dL — ABNORMAL LOW (ref 6.5–8.1)

## 2019-09-17 LAB — D-DIMER, QUANTITATIVE: D-Dimer, Quant: 0.46 ug/mL-FEU (ref 0.00–0.50)

## 2019-09-17 LAB — CULTURE, RESPIRATORY W GRAM STAIN

## 2019-09-17 LAB — GLUCOSE, CAPILLARY
Glucose-Capillary: 152 mg/dL — ABNORMAL HIGH (ref 70–99)
Glucose-Capillary: 337 mg/dL — ABNORMAL HIGH (ref 70–99)
Glucose-Capillary: 79 mg/dL (ref 70–99)
Glucose-Capillary: 199 mg/dL — ABNORMAL HIGH (ref 70–99)

## 2019-09-17 LAB — C-REACTIVE PROTEIN: CRP: 2 mg/dL — ABNORMAL HIGH (ref <1.0)

## 2019-09-17 MED ORDER — INSULIN ASPART 100 UNIT/ML ~~LOC~~ SOLN
4.0000 [IU] | Freq: Three times a day (TID) | SUBCUTANEOUS | Status: DC
  Administered 2019-09-17 – 2019-09-19 (×7): 4 [IU] via SUBCUTANEOUS

## 2019-09-17 NOTE — Plan of Care (Signed)
  Problem: Education: Goal: Knowledge of risk factors and measures for prevention of condition will improve Outcome: Progressing   Problem: Coping: Goal: Psychosocial and spiritual needs will be supported Outcome: Progressing   Problem: Respiratory: Goal: Will maintain a patent airway Outcome: Progressing Goal: Complications related to the disease process, condition or treatment will be avoided or minimized Outcome: Progressing   

## 2019-09-17 NOTE — Progress Notes (Addendum)
PROGRESS NOTE    Becky Hendricks  JHE:[id number] DOB: Jul 15, 1946 DOA: 09/07/2019 PCP: System, Pcp Not In   Brief Narrative:  74 year old with history of atrial fibrillation, bipolar disorder, DM2, morbid obesity, recent C. difficile infection was brought to the hospital for change in mental status.  She was admitted to [facility] last month diagnosed with C. difficile and eventually sent to SNF in [place].  At the rehab facility progressively worsened with labored breathing therefore brought to the hospital.  She was intubated and transferred to [facility] for further management.  Due to concerns of UTI and bacterial pneumonia she was started on vancomycin and cefepime.  She was also given a course of Rocephin.  Urine cultures grew Klebsiella.  CT head was negative for acute pathology.  She completed treatment of remdesivir.  Eventually extubated on 2/1.  Over the course of several days she was weaned off oxygen.  Eventually physical therapist recommended skilled nursing facility, speech and swallow recommended dysphagia 3 diet.  Case management was consulted.   Assessment & Plan:   Principal Problem:   Pneumonia due to COVID-19 virus Active Problems:   AKI (acute kidney injury) (HCC)   Pressure injury of skin   Respiratory failure with hypoxia (HCC)   Acute metabolic encephalopathy   UTI (urinary tract infection), bacterial  Acute hypoxic respiratory failure secondary to COVID-19 pneumonia -Improved. Completed course of remdesivir. -Now patient is on room air -Completed Decadron -Bronchodilators, incentive spirometer, flutter valve -Inflammatory markers improved -Supportive care.  Sepsis secondary to urinary tract infection/Klebsiella and COVID-19 pneumonia; Improved.  -Improved.  Completed vancomycin and cefepime course -Discontinue Foley catheter  Diabetes mellitus type 2, uncontrolled secondary to hyperglycemia -Hemoglobin A1c 9.5 -Lantus and ISS.  Chronic atrial fibrillation -Continue  Eliquis and metoprolol 25 mg twice daily  Essential hypertension -Norvasc 5 mg daily, metoprolol 25 mg twice daily  Bipolar disorder -Continue quetiapine and valproic acid  Multiple coccyx ulcers/pressure ulcers -Air mattress  Will need extensive amount of therapy.   DVT prophylaxis: Eliquis Code Status: Full code Family Communication: Spoke with her Brother.  Disposition Plan:   Patient From= SNF, [place]  Patient Anticipated D/C place= SNF  Barriers= patient is now off oxygen.  She will require aggressive therapy at her rehab facility.  TOC team working on it.  In the meantime she does require significant amount of nursing care therefore maintain hospital stay.  She is unsafe for discharge at home.  Consultants:   None  Subjective: Overall feels great, No complaints. Still little confused about the date.  Review of Systems Otherwise negative except as per HPI, including: General = no fevers, chills, dizziness,  fatigue HEENT/EYES = negative for loss of vision, double vision, blurred vision,  sore throa Cardiovascular= negative for chest pain, palpitation Respiratory/lungs= negative for shortness of breath, cough, wheezing; hemoptysis,  Gastrointestinal= negative for nausea, vomiting, abdominal pain Genitourinary= negative for Dysuria MSK = Negative for arthralgia, myalgias Neurology= Negative for headache, numbness, tingling  Psychiatry= Negative for suicidal and homocidal ideation Skin= Negative for Rash  Examination: Constitutional: NAD, On Room Air. Chronically ill appearing.  Respiratory: Bibasilar Rhonchi.  Cardiovascular: Normal sinus rhythm, no rubs Abdomen: Nontender nondistended good bowel sounds Musculoskeletal: No edema noted, strength in all extremities is 4/5 Skin: No rashes seen Neurologic: CN 2-12 grossly intact.  And nonfocal Psychiatric: Normal judgment and insight. Alert and oriented x 2. Normal mood.   Rectal tube currently in place.   Objective: Vitals:   09/17/19 0000 09/17/19 0005 09/17/19 0400  09/17/19 0728  BP: (!) 71/37 108/68 (!) 145/79 (!) 152/89  Pulse: (!) 52 (!) 54 66 66  Resp: (!) 26 18 20 20   Temp:  98.8 F (37.1 C) 98.2 F (36.8 C) 98.2 F (36.8 C)  TempSrc:  Oral Oral Oral  SpO2: 95% 96% 92% 95%  Weight:      Height:        Intake/Output Summary (Last 24 hours) at 09/17/2019 1115 Last data filed at 09/16/2019 1945 Gross per 24 hour  Intake 700 ml  Output 400 ml  Net 300 ml   Filed Weights   09/13/19 1954 09/15/19 0500 09/16/19 0500  Weight: 111.6 kg 106 kg 108.8 kg     Data Reviewed:   CBC: Recent Labs  Lab 09/12/19 0115 09/13/19 0310 09/14/19 0430 09/15/19 0720 09/16/19 0157  WBC 13.1* 14.0* 18.1* 14.2* 11.8*  NEUTROABS  --   --   --   --  8.9*  HGB 11.5* 11.4* 11.5* 12.4 12.2  HCT 36.7 36.3 36.0 38.7 38.5  MCV 95.1 95.0 96.0 96.0 94.6  PLT 176 185 189 231 258   Basic Metabolic Panel: Recent Labs  Lab 09/13/19 0310 09/14/19 0430 09/15/19 0720 09/16/19 0157 09/17/19 0350  NA 142 139 141 141 141  K 3.6 4.3 3.8 4.0 4.3  CL 97* 96* 101 99 98  CO2 32 31 30 31 29   GLUCOSE 223* 310* 162* 201* 198*  BUN 49* 56* 40* 41* 38*  CREATININE 0.58 0.62 0.53 0.52 0.61  CALCIUM 8.8* 8.6* 8.5* 8.8* 8.9  MG 1.8 2.1  --   --   --   PHOS 4.0 5.1*  --   --   --    GFR: Estimated Creatinine Clearance: 80.5 mL/min (by C-G formula based on SCr of 0.61 mg/dL). Liver Function Tests: Recent Labs  Lab 09/11/19 0135 09/12/19 0115 09/16/19 0157 09/17/19 0350  AST 26 18 21 25   ALT 38 31 26 33  ALKPHOS 143* 132* 81 84  BILITOT 0.3 0.4 0.9 1.0  PROT 5.6* 5.7* 6.1* 6.4*  ALBUMIN 1.9* 1.9* 2.2* 2.4*   No results for input(s): LIPASE, AMYLASE in the last 168 hours. No results for input(s): AMMONIA in the last 168 hours. Coagulation Profile: No results for input(s): INR, PROTIME in the last 168 hours. Cardiac Enzymes: No results for input(s): CKTOTAL, CKMB, CKMBINDEX, TROPONINI in the  last 168 hours. BNP (last 3 results) No results for input(s): PROBNP in the last 8760 hours. HbA1C: No results for input(s): HGBA1C in the last 72 hours. CBG: Recent Labs  Lab 09/16/19 0752 09/16/19 1128 09/16/19 1622 09/16/19 2019 09/17/19 0726  GLUCAP 170* 334* 226* 227* 152*   Lipid Profile: No results for input(s): CHOL, HDL, LDLCALC, TRIG, CHOLHDL, LDLDIRECT in the last 72 hours. Thyroid Function Tests: No results for input(s): TSH, T4TOTAL, FREET4, T3FREE, THYROIDAB in the last 72 hours. Anemia Panel: Recent Labs    09/16/19 0157 09/17/19 0350  FERRITIN 379* 413*   Sepsis Labs: No results for input(s): PROCALCITON, LATICACIDVEN in the last 168 hours.  Recent Results (from the past 240 hour(s))  Urine culture     Status: Abnormal   Collection Time: 09/07/19 11:19 AM   Specimen: In/Out Cath Urine  Result Value Ref Range Status   Specimen Description   Final    IN/OUT CATH URINE Performed at [facility], [address] [place], [facility] [address]    Special Requests   Final    NONE Performed at  [facility], [address], [place], [place] [address]    Culture >=100,000 COLONIES/mL KLEBSIELLA PNEUMONIAE (A)  Final   Report Status 09/09/2019 FINAL  Final   Organism ID, Bacteria KLEBSIELLA PNEUMONIAE (A)  Final      Susceptibility   Klebsiella pneumoniae - MIC*    AMPICILLIN RESISTANT Resistant     CEFAZOLIN <=4 SENSITIVE Sensitive     CEFTRIAXONE <=0.25 SENSITIVE Sensitive     CIPROFLOXACIN <=0.25 SENSITIVE Sensitive     GENTAMICIN <=1 SENSITIVE Sensitive     IMIPENEM <=0.25 SENSITIVE Sensitive     NITROFURANTOIN <=16 SENSITIVE Sensitive     TRIMETH/SULFA <=20 SENSITIVE Sensitive     AMPICILLIN/SULBACTAM <=2 SENSITIVE Sensitive     PIP/TAZO <=4 SENSITIVE Sensitive     * >=100,000 COLONIES/mL KLEBSIELLA PNEUMONIAE  Respiratory Panel by RT PCR (Flu A&B, Covid) - Nasopharyngeal Swab     Status: Abnormal   Collection Time: 09/07/19  4:17  PM   Specimen: Nasopharyngeal Swab  Result Value Ref Range Status   SARS Coronavirus 2 by RT PCR POSITIVE (A) NEGATIVE Final    Comment: RESULT CALLED TO, READ BACK BY AND VERIFIED WITH: SU PALMER @1757  09/07/19 MJU (NOTE) SARS-CoV-2 target nucleic acids are DETECTED. SARS-CoV-2 RNA is generally detectable in upper respiratory specimens  during the acute phase of infection. Positive results are indicative of the presence of the identified virus, but do not rule out bacterial infection or co-infection with other pathogens not detected by the test. Clinical correlation with patient history and other diagnostic information is necessary to determine patient infection status. The expected result is Negative. Fact Sheet for Patients:  09/09/19 Fact Sheet for Healthcare Providers: https://www.moore.com/ This test is not yet approved or cleared by the https://www.young.biz/ FDA and  has been authorized for detection and/or diagnosis of SARS-CoV-2 by FDA under an Emergency Use Authorization (EUA).  This EUA will remain in effect (meaning this test can be used) for the d uration of  the COVID-19 declaration under Section 564(b)(1) of the Act, 21 U.S.C. section 360bbb-3(b)(1), unless the authorization is terminated or revoked sooner.    Influenza A by PCR NEGATIVE NEGATIVE Final   Influenza B by PCR NEGATIVE NEGATIVE Final    Comment: (NOTE) The Xpert Xpress SARS-CoV-2/FLU/RSV assay is intended as an aid in  the diagnosis of influenza from Nasopharyngeal swab specimens and  should not be used as a sole basis for treatment. Nasal washings and  aspirates are unacceptable for Xpert Xpress SARS-CoV-2/FLU/RSV  testing. Fact Sheet for Patients: [place] Fact Sheet for Healthcare Providers: https://www.moore.com/ This test is not yet approved or cleared by the https://www.young.biz/ FDA and  has been authorized  for detection and/or diagnosis of SARS-CoV-2 by  FDA under an Emergency Use Authorization (EUA). This EUA will remain  in effect (meaning this test can be used) for the duration of the  Covid-19 declaration under Section 564(b)(1) of the Act, 21  U.S.C. section 360bbb-3(b)(1), unless the authorization is  terminated or revoked. Performed at [facility], [address] [place], [place]   MRSA PCR Screening     Status: None   Collection Time: 09/07/19 11:39 PM   Specimen: Nasopharyngeal  Result Value Ref Range Status   MRSA by PCR NEGATIVE NEGATIVE Final    Comment:        The GeneXpert MRSA Assay (FDA approved for NASAL specimens only), is one component of a comprehensive MRSA colonization surveillance program. It is not intended to  diagnose MRSA infection nor to guide or monitor treatment for MRSA infections. Performed at [facility], [address], [place], [place] [address]   Culture, blood (Routine X 2) w Reflex to ID Panel     Status: Abnormal   Collection Time: 09/08/19  1:00 PM   Specimen: BLOOD  Result Value Ref Range Status   Specimen Description BLOOD SITE NOT SPECIFIED  Final   Special Requests   Final    BOTTLES DRAWN AEROBIC ONLY Blood Culture results may not be optimal due to an inadequate volume of blood received in culture bottles   Culture  Setup Time   Final    GRAM POSITIVE COCCI IN CLUSTERS AEROBIC BOTTLE ONLY CRITICAL RESULT CALLED TO, READ BACK BY AND VERIFIED WITH: PHARMD ERIN WILLIAMSON [phone] FCP    Culture (A)  Final    STAPHYLOCOCCUS SPECIES (COAGULASE NEGATIVE) THE SIGNIFICANCE OF ISOLATING THIS ORGANISM FROM A SINGLE SET OF BLOOD CULTURES WHEN MULTIPLE SETS ARE DRAWN IS UNCERTAIN. PLEASE NOTIFY THE MICROBIOLOGY DEPARTMENT WITHIN ONE WEEK IF SPECIATION AND SENSITIVITIES ARE REQUIRED. Performed at [facility], [address] [place], [place] [address]    Report Status 09/11/2019 FINAL  Final   Culture, blood (Routine X 2) w Reflex to ID Panel     Status: None   Collection Time: 09/08/19  1:00 PM   Specimen: BLOOD  Result Value Ref Range Status   Specimen Description BLOOD SITE NOT SPECIFIED  Final   Special Requests   Final    BOTTLES DRAWN AEROBIC ONLY Blood Culture adequate volume   Culture   Final    NO GROWTH 5 DAYS Performed at [facility], [address], [place], [place] [address]    Report Status 09/13/2019 FINAL  Final  Culture, blood (Routine X 2) w Reflex to ID Panel     Status: None (Preliminary result)   Collection Time: 09/12/19  5:13 PM   Specimen: BLOOD LEFT FOREARM  Result Value Ref Range Status   Specimen Description   Final    BLOOD LEFT FOREARM Performed at [facility], [address] [place], [place] [address]    Special Requests   Final    BOTTLES DRAWN AEROBIC ONLY Blood Culture adequate volume Performed at [facility], [address] [place], [place] [address]    Culture   Final    NO GROWTH 4 DAYS Performed at [facility], [address] [place], [place] [address]    Report Status PENDING  Incomplete  Culture, Urine     Status: None   Collection Time: 09/12/19  5:14 PM   Specimen: Urine, Catheterized  Result Value Ref Range Status   Specimen Description   Final    URINE, CATHETERIZED Performed at [facility], [address] [place], [place] [address]    Special Requests   Final    NONE Performed at [facility], [address] [place], [place] [address]    Culture   Final    NO GROWTH Performed at [facility], [address], [place], [place] [address]    Report Status 09/14/2019 FINAL  Final  Culture, respiratory (non-expectorated)     Status: None   Collection Time: 09/12/19  5:14 PM   Specimen: Tracheal Aspirate; Respiratory  Result Value Ref Range Status   Specimen Description   Final    TRACHEAL ASPIRATE Performed at  [facility], [place] [address], [place], [place] [address]    Special Requests   Final    NONE Performed at [facility], [place] [address], [place], [place] [address]    Gram Stain   Final    MODERATE WBC PRESENT, PREDOMINANTLY PMN FEW GRAM NEGATIVE RODS FEW GRAM POSITIVE COCCI IN CLUSTERS RARE GRAM POSITIVE RODS Performed at [facility], [place] [address], [place], [place] [address]    Culture   Final    FEW METHICILLIN RESISTANT STAPHYLOCOCCUS AUREUS FEW ENTEROCOCCUS FAECALIS    Report Status 09/17/2019 FINAL  Final   Organism ID, Bacteria METHICILLIN RESISTANT STAPHYLOCOCCUS AUREUS  Final   Organism ID, Bacteria ENTEROCOCCUS FAECALIS  Final      Susceptibility   Enterococcus faecalis - MIC*    AMPICILLIN <=2 SENSITIVE Sensitive     VANCOMYCIN 1 SENSITIVE Sensitive     GENTAMICIN SYNERGY SENSITIVE Sensitive     * FEW ENTEROCOCCUS FAECALIS   Methicillin resistant staphylococcus aureus - MIC*    CIPROFLOXACIN >=8 RESISTANT Resistant     ERYTHROMYCIN >=8 RESISTANT Resistant     GENTAMICIN <=0.5 SENSITIVE Sensitive     OXACILLIN >=4 RESISTANT Resistant     TETRACYCLINE <=1 SENSITIVE Sensitive     VANCOMYCIN 1 SENSITIVE Sensitive     TRIMETH/SULFA >=320 RESISTANT Resistant     CLINDAMYCIN <=0.25 SENSITIVE Sensitive     RIFAMPIN <=0.5 SENSITIVE Sensitive     Inducible Clindamycin NEGATIVE Sensitive     * FEW METHICILLIN RESISTANT STAPHYLOCOCCUS AUREUS  Culture, blood (Routine X 2) w Reflex to ID Panel     Status: None (Preliminary result)   Collection Time: 09/12/19  5:18 PM   Specimen: BLOOD  Result Value Ref Range Status   Specimen Description BLOOD RIGHT ANTECUBITAL  Final   Special Requests   Final    BOTTLES DRAWN AEROBIC ONLY Blood Culture adequate volume Performed at [facility], [place] [address] [place], [place] [address]    Culture   Final    NO GROWTH 4 DAYS Performed at [facility], [place]  [address] [place], [place] [address]    Report Status PENDING  Incomplete         Radiology Studies: No results found.      Scheduled Meds: . amLODipine  5 mg Oral Daily  . apixaban  5 mg Oral BID  . chlorhexidine  15 mL Mouth/Throat BID  . Chlorhexidine Gluconate Cloth  6 each Topical Daily  . chlorpheniramine-HYDROcodone  5 mL Oral Q12H  . feeding supplement (ENSURE ENLIVE)  237 mL Oral TID BM  . insulin aspart  0-15 Units Subcutaneous TID WC  . insulin aspart  0-5 Units Subcutaneous QHS  . insulin aspart  4 Units Subcutaneous TID WC  . insulin detemir  30 Units Subcutaneous BID  . lisinopril  10 mg Oral Daily  . metoprolol tartrate  25 mg Oral BID  . multivitamin with minerals  1 tablet Oral Daily  . QUEtiapine  250 mg Oral QHS  . valproic acid  500 mg Oral Daily   Continuous Infusions:   LOS: 10 days   Time spent= 35 mins    Kenyana Husak Arsenio Loader, MD [facility]  If 7PM-7AM, please contact night-coverage  09/17/2019, 11:15 AM
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
## 2019-09-17 NOTE — Progress Notes (Addendum)
Blood pressure 71/37 validated at 0000, is not correct reading because patient's blood pressure cuff was saturated and would unplug while trying to take blood pressure. Blood pressure cuff was changed and new reading was obtained at 0005 108/68.

## 2019-09-18 ENCOUNTER — Inpatient Hospital Stay (HOSPITAL_COMMUNITY): Payer: Medicare Other

## 2019-09-18 LAB — COMPREHENSIVE METABOLIC PANEL
ALT: 34 U/L (ref 0–44)
AST: 33 U/L (ref 15–41)
Albumin: 2.1 g/dL — ABNORMAL LOW (ref 3.5–5.0)
Alkaline Phosphatase: 85 U/L (ref 38–126)
Anion gap: 10 (ref 5–15)
BUN: 35 mg/dL — ABNORMAL HIGH (ref 8–23)
CO2: 29 mmol/L (ref 22–32)
Calcium: 8.5 mg/dL — ABNORMAL LOW (ref 8.9–10.3)
Chloride: 102 mmol/L (ref 98–111)
Creatinine, Ser: 0.63 mg/dL (ref 0.44–1.00)
GFR calc Af Amer: >60 mL/min (ref >60)
GFR calc non Af Amer: >60 mL/min (ref >60)
Glucose, Bld: 177 mg/dL — ABNORMAL HIGH (ref 70–99)
Potassium: 3.4 mmol/L — ABNORMAL LOW (ref 3.5–5.1)
Sodium: 141 mmol/L (ref 135–145)
Total Bilirubin: 0.6 mg/dL (ref 0.3–1.2)
Total Protein: 5.6 g/dL — ABNORMAL LOW (ref 6.5–8.1)

## 2019-09-18 LAB — FERRITIN: Ferritin: 394 ng/mL — ABNORMAL HIGH (ref 11–307)

## 2019-09-18 LAB — CULTURE, BLOOD (ROUTINE X 2)
Culture: NO GROWTH
Culture: NO GROWTH
Special Requests: ADEQUATE
Special Requests: ADEQUATE

## 2019-09-18 LAB — GLUCOSE, CAPILLARY
Glucose-Capillary: 100 mg/dL — ABNORMAL HIGH (ref 70–99)
Glucose-Capillary: 127 mg/dL — ABNORMAL HIGH (ref 70–99)
Glucose-Capillary: 201 mg/dL — ABNORMAL HIGH (ref 70–99)
Glucose-Capillary: 208 mg/dL — ABNORMAL HIGH (ref 70–99)
Glucose-Capillary: 243 mg/dL — ABNORMAL HIGH (ref 70–99)

## 2019-09-18 LAB — C-REACTIVE PROTEIN: CRP: 1.7 mg/dL — ABNORMAL HIGH (ref <1.0)

## 2019-09-18 LAB — D-DIMER, QUANTITATIVE: D-Dimer, Quant: <0.27 ug/mL-FEU (ref 0.00–0.50)

## 2019-09-18 IMAGING — DX DG CHEST 1V PORT
1 series · 1 of 1 positions shown · non-contrast
Comparison: [DATE]

CLINICAL DATA: Dyspnea

EXAM:
PORTABLE CHEST 1 VIEW

[chest]
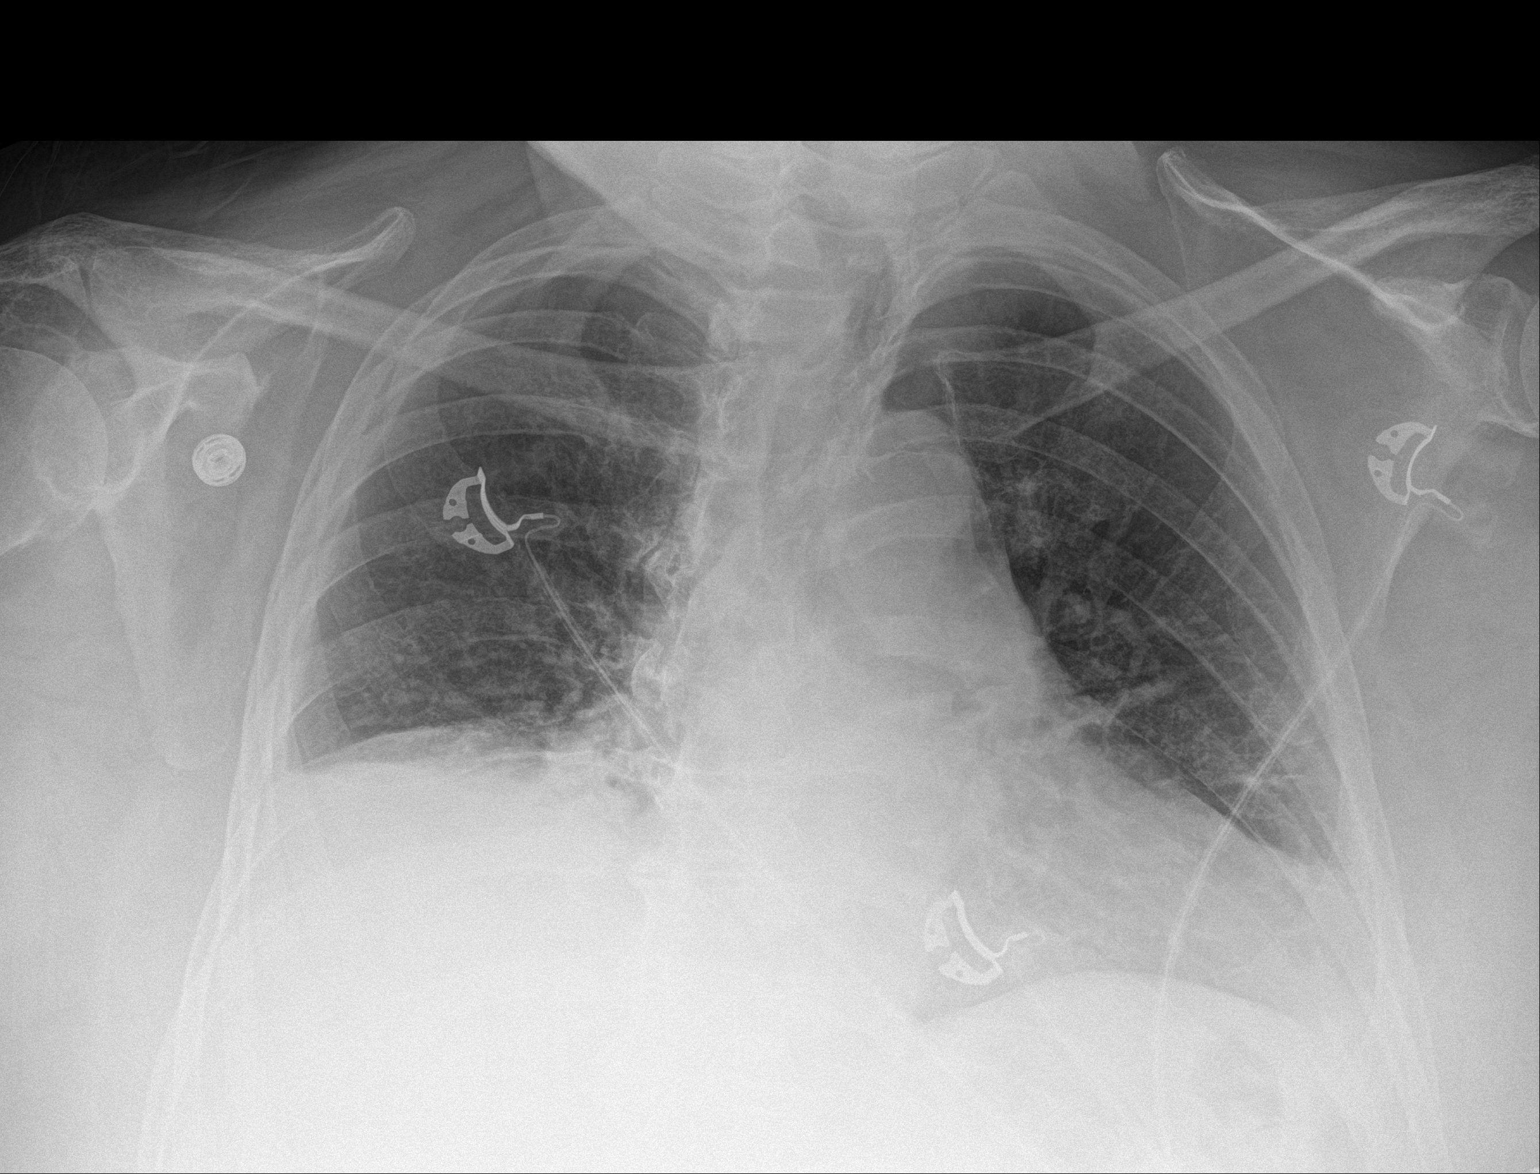

[1 of 1 positions shown; findings below may reference images not displayed]

FINDINGS: Single frontal view of the chest was obtained. Interval removal of
endotracheal and enteric catheters. The cardiac silhouette is
unchanged. Increased density at the right lung base consistent with
consolidation and small effusion. Improved aeration of the left lung
base. No pneumothorax.
IMPRESSION: 1. Right basilar consolidation and small effusion.

## 2019-09-18 MED ORDER — IPRATROPIUM-ALBUTEROL 20-100 MCG/ACT IN AERS
1.0000 | INHALATION_SPRAY | Freq: Four times a day (QID) | RESPIRATORY_TRACT | Status: DC
  Administered 2019-09-18 – 2019-09-22 (×16): 1 via RESPIRATORY_TRACT
  Filled 2019-09-18: qty 4

## 2019-09-18 MED ORDER — POTASSIUM CHLORIDE CRYS ER 20 MEQ PO TBCR
40.0000 meq | EXTENDED_RELEASE_TABLET | Freq: Once | ORAL | Status: AC
  Administered 2019-09-18: 40 meq via ORAL
  Filled 2019-09-18: qty 2

## 2019-09-18 NOTE — TOC Progression Note (Addendum)
Transition of Care [facility]) - Progression Note    Patient Details  Name: Becky Hendricks MRN: [id number] Date of Birth: 06-07-1946  [facility] CM/SW Contact  Golda Acre, RN Phone Number: 09/18/2019, 2:26 PM  Clinical Narrative:    Request for floor to call Vivi Martens back at passar number given. And have the patient on the phone. can someone call Sharlett Iles at [phone] asap about her passar number and have the patient get on the phone. I can not get her to a snf without this being done. Thank you    Expected Discharge Plan: Skilled Nursing Facility Barriers to Discharge: Continued Medical Work up, SNF Pending bed offer  Expected Discharge Plan and Services Expected Discharge Plan: Skilled Nursing Facility   Discharge Planning Services: CM Consult Post Acute Care Choice: Skilled Nursing Facility Living arrangements for the past 2 months: Single Family Home                                       Social Determinants of Health (SDOH) Interventions    Readmission Risk Interventions No flowsheet data found.

## 2019-09-18 NOTE — Progress Notes (Signed)
PROGRESS NOTE    Becky Hendricks  XVE:[id number] DOB: [birth year] DOA: 09/07/2019 PCP: System, Pcp Not In   Brief Narrative:  74 year old with history of atrial fibrillation, bipolar disorder, DM2, morbid obesity, recent C. difficile infection was brought to the hospital for change in mental status.  She was admitted to [facility] last month diagnosed with C. difficile and eventually sent to SNF in [place].  At the rehab facility progressively worsened with labored breathing therefore brought to the hospital.  She was intubated and transferred to [facility] for further management.  Due to concerns of UTI and bacterial pneumonia she was started on vancomycin and cefepime.  She was also given a course of Rocephin.  Urine cultures grew Klebsiella.  CT head was negative for acute pathology.  She completed treatment of remdesivir.  Eventually extubated on 2/1.  Over the course of several days she was weaned off oxygen.  Eventually physical therapist recommended skilled nursing facility, speech and swallow recommended dysphagia 3 diet.  Case management was consulted.  Currently working on placement for her.   Assessment & Plan:   Principal Problem:   Pneumonia due to COVID-19 virus Active Problems:   AKI (acute kidney injury) (HCC)   Pressure injury of skin   Respiratory failure with hypoxia (HCC)   Acute metabolic encephalopathy   UTI (urinary tract infection), bacterial  Acute hypoxic respiratory failure secondary to COVID-19 pneumonia -Greatly improved.  Completed course of remdesivir and Decadron.  Now on room air. -Bronchodilators, incentive spirometer, flutter valve -Inflammatory markers improved -Supportive care.  Sepsis secondary to urinary tract infection/Klebsiella and COVID-19 pneumonia; Improved.  -Improved.  Completed vancomycin and cefepime. -Discontinue Foley catheter  Diabetes mellitus type 2, uncontrolled secondary to hyperglycemia -Hemoglobin A1c 9.5 -Lantus and ISS.  Chronic atrial  fibrillation -Continue Eliquis and metoprolol 25 mg twice daily  Essential hypertension -Norvasc 5 mg daily, metoprolol 25 mg twice daily  Bipolar disorder -Continue quetiapine and valproic acid  Multiple coccyx ulcers/pressure ulcers -Air mattress  Will need extensive amount of therapy.   DVT prophylaxis: Eliquis Code Status: Full code Family Communication: None Disposition Plan:   Patient From= SNF, [facility]  Patient Anticipated D/C place= SNF  Barriers= medically patient is doing much better today.  She is ready to be transferred to skilled nursing facility once she has a bed available and insurance approval.  Consultants:   None  Subjective: Overall feels great, no complaints.  Review of Systems Otherwise negative except as per HPI, including: General = no fevers, chills, dizziness,  fatigue HEENT/EYES = negative for loss of vision, double vision, blurred vision,  sore throa Cardiovascular= negative for chest pain, palpitation Respiratory/lungs= negative for shortness of breath, cough, wheezing; hemoptysis,  Gastrointestinal= negative for nausea, vomiting, abdominal pain Genitourinary= negative for Dysuria MSK = Negative for arthralgia, myalgias Neurology= Negative for headache, numbness, tingling  Psychiatry= Negative for suicidal and homocidal ideation Skin= Negative for Rash   Examination: Constitutional: Not in acute distress, chronically ill and frail Respiratory: Clear to auscultation bilaterally Cardiovascular: Normal sinus rhythm, no rubs Abdomen: Nontender nondistended good bowel sounds Musculoskeletal: No edema noted.  Strength in all extremities 4/5 Skin: No rashes seen Neurologic: CN 2-12 grossly intact.  And nonfocal Psychiatric: Normal judgment and insight. Alert and oriented x 3. Normal mood.    Rectal tube currently in place.  Objective: Vitals:   09/17/19 1900 09/18/19 0000 09/18/19 0359 09/18/19 0758  BP: (!) 146/74 104/74 (!) 152/78    Pulse: 74 (!) 57 61  Resp: 20 (!) 24    Temp: 98.5 F (36.9 C) 98.3 F (36.8 C) 97.6 F (36.4 C) 97.6 F (36.4 C)  TempSrc: Oral Oral Axillary Oral  SpO2: 95% 95% 96%   Weight:      Height:        Intake/Output Summary (Last 24 hours) at 09/18/2019 1124 Last data filed at 09/18/2019 0800 Gross per 24 hour  Intake --  Output 250 ml  Net -250 ml   Filed Weights   09/13/19 1954 09/15/19 0500 09/16/19 0500  Weight: 111.6 kg 106 kg 108.8 kg     Data Reviewed:   CBC: Recent Labs  Lab 09/12/19 0115 09/13/19 0310 09/14/19 0430 09/15/19 0720 09/16/19 0157  WBC 13.1* 14.0* 18.1* 14.2* 11.8*  NEUTROABS  --   --   --   --  8.9*  HGB 11.5* 11.4* 11.5* 12.4 12.2  HCT 36.7 36.3 36.0 38.7 38.5  MCV 95.1 95.0 96.0 96.0 94.6  PLT 176 185 189 231 258   Basic Metabolic Panel: Recent Labs  Lab 09/13/19 0310 09/13/19 0310 09/14/19 0430 09/15/19 0720 09/16/19 0157 09/17/19 0350 09/18/19 0050  NA 142   < > 139 141 141 141 141  K 3.6   < > 4.3 3.8 4.0 4.3 3.4*  CL 97*   < > 96* 101 99 98 102  CO2 32   < > 31 30 31 29 29   GLUCOSE 223*   < > 310* 162* 201* 198* 177*  BUN 49*   < > 56* 40* 41* 38* 35*  CREATININE 0.58   < > 0.62 0.53 0.52 0.61 0.63  CALCIUM 8.8*   < > 8.6* 8.5* 8.8* 8.9 8.5*  MG 1.8  --  2.1  --   --   --   --   PHOS 4.0  --  5.1*  --   --   --   --    < > = values in this interval not displayed.   GFR: Estimated Creatinine Clearance: 80.5 mL/min (by C-G formula based on SCr of 0.63 mg/dL). Liver Function Tests: Recent Labs  Lab 09/12/19 0115 09/16/19 0157 09/17/19 0350 09/18/19 0050  AST 18 21 25  33  ALT 31 26 33 34  ALKPHOS 132* 81 84 85  BILITOT 0.4 0.9 1.0 0.6  PROT 5.7* 6.1* 6.4* 5.6*  ALBUMIN 1.9* 2.2* 2.4* 2.1*   No results for input(s): LIPASE, AMYLASE in the last 168 hours. No results for input(s): AMMONIA in the last 168 hours. Coagulation Profile: No results for input(s): INR, PROTIME in the last 168 hours. Cardiac Enzymes: No  results for input(s): CKTOTAL, CKMB, CKMBINDEX, TROPONINI in the last 168 hours. BNP (last 3 results) No results for input(s): PROBNP in the last 8760 hours. HbA1C: No results for input(s): HGBA1C in the last 72 hours. CBG: Recent Labs  Lab 09/17/19 1258 09/17/19 1713 09/17/19 2031 09/18/19 0007 09/18/19 0736  GLUCAP 337* 79 199* 201* 100*   Lipid Profile: No results for input(s): CHOL, HDL, LDLCALC, TRIG, CHOLHDL, LDLDIRECT in the last 72 hours. Thyroid Function Tests: No results for input(s): TSH, T4TOTAL, FREET4, T3FREE, THYROIDAB in the last 72 hours. Anemia Panel: Recent Labs    09/17/19 0350 09/18/19 0050  FERRITIN 413* 394*   Sepsis Labs: No results for input(s): PROCALCITON, LATICACIDVEN in the last 168 hours.  Recent Results (from the past 240 hour(s))  Culture, blood (Routine X 2) w Reflex to ID Panel     Status: Abnormal  Collection Time: 09/08/19  1:00 PM   Specimen: BLOOD  Result Value Ref Range Status   Specimen Description BLOOD SITE NOT SPECIFIED  Final   Special Requests   Final    BOTTLES DRAWN AEROBIC ONLY Blood Culture results may not be optimal due to an inadequate volume of blood received in culture bottles   Culture  Setup Time   Final    GRAM POSITIVE COCCI IN CLUSTERS AEROBIC BOTTLE ONLY CRITICAL RESULT CALLED TO, READ BACK BY AND VERIFIED WITH: PHARMD ERIN WILLIAMSON [phone] FCP    Culture (A)  Final    STAPHYLOCOCCUS SPECIES (COAGULASE NEGATIVE) THE SIGNIFICANCE OF ISOLATING THIS ORGANISM FROM A SINGLE SET OF BLOOD CULTURES WHEN MULTIPLE SETS ARE DRAWN IS UNCERTAIN. PLEASE NOTIFY THE MICROBIOLOGY DEPARTMENT WITHIN ONE WEEK IF SPECIATION AND SENSITIVITIES ARE REQUIRED. Performed at [facility], [address], [place], [place] [address]    Report Status 09/11/2019 FINAL  Final  Culture, blood (Routine X 2) w Reflex to ID Panel     Status: None   Collection Time: 09/08/19  1:00 PM   Specimen: BLOOD  Result Value Ref Range Status    Specimen Description BLOOD SITE NOT SPECIFIED  Final   Special Requests   Final    BOTTLES DRAWN AEROBIC ONLY Blood Culture adequate volume   Culture   Final    NO GROWTH 5 DAYS Performed at [facility], [address] [place], [place] [address]    Report Status 09/13/2019 FINAL  Final  Culture, blood (Routine X 2) w Reflex to ID Panel     Status: None (Preliminary result)   Collection Time: 09/12/19  5:13 PM   Specimen: BLOOD LEFT FOREARM  Result Value Ref Range Status   Specimen Description   Final    BLOOD LEFT FOREARM Performed at [facility], [address], [place], [place] [address]    Special Requests   Final    BOTTLES DRAWN AEROBIC ONLY Blood Culture adequate volume Performed at [facility], [address] [place], [place] [address]    Culture   Final    NO GROWTH 4 DAYS Performed at [facility], [address] [place], [place] [address]    Report Status PENDING  Incomplete  Culture, Urine     Status: None   Collection Time: 09/12/19  5:14 PM   Specimen: Urine, Catheterized  Result Value Ref Range Status   Specimen Description   Final    URINE, CATHETERIZED Performed at [facility], [address], [place], [place] [address]    Special Requests   Final    NONE Performed at [facility], [address], [place], [place] [address]    Culture   Final    NO GROWTH Performed at [facility], [address] [place], [place] [address]    Report Status 09/14/2019 FINAL  Final  Culture, respiratory (non-expectorated)     Status: None   Collection Time: 09/12/19  5:14 PM   Specimen: Tracheal Aspirate; Respiratory  Result Value Ref Range Status   Specimen Description   Final    TRACHEAL ASPIRATE Performed at [facility], [address] [place], [place] [address]    Special Requests   Final    NONE Performed at [facility],  [address] [place], [place] [address]    Gram Stain   Final    MODERATE  WBC PRESENT, PREDOMINANTLY PMN FEW GRAM NEGATIVE RODS FEW GRAM POSITIVE COCCI IN CLUSTERS RARE GRAM POSITIVE RODS Performed at [facility], [place] [address] [place], [place] [address]    Culture   Final    FEW METHICILLIN RESISTANT STAPHYLOCOCCUS AUREUS FEW ENTEROCOCCUS FAECALIS    Report Status 09/17/2019 FINAL  Final   Organism ID, Bacteria METHICILLIN RESISTANT STAPHYLOCOCCUS AUREUS  Final   Organism ID, Bacteria ENTEROCOCCUS FAECALIS  Final      Susceptibility   Enterococcus faecalis - MIC*    AMPICILLIN <=2 SENSITIVE Sensitive     VANCOMYCIN 1 SENSITIVE Sensitive     GENTAMICIN SYNERGY SENSITIVE Sensitive     * FEW ENTEROCOCCUS FAECALIS   Methicillin resistant staphylococcus aureus - MIC*    CIPROFLOXACIN >=8 RESISTANT Resistant     ERYTHROMYCIN >=8 RESISTANT Resistant     GENTAMICIN <=0.5 SENSITIVE Sensitive     OXACILLIN >=4 RESISTANT Resistant     TETRACYCLINE <=1 SENSITIVE Sensitive     VANCOMYCIN 1 SENSITIVE Sensitive     TRIMETH/SULFA >=320 RESISTANT Resistant     CLINDAMYCIN <=0.25 SENSITIVE Sensitive     RIFAMPIN <=0.5 SENSITIVE Sensitive     Inducible Clindamycin NEGATIVE Sensitive     * FEW METHICILLIN RESISTANT STAPHYLOCOCCUS AUREUS  Culture, blood (Routine X 2) w Reflex to ID Panel     Status: None (Preliminary result)   Collection Time: 09/12/19  5:18 PM   Specimen: BLOOD  Result Value Ref Range Status   Specimen Description BLOOD RIGHT ANTECUBITAL  Final   Special Requests   Final    BOTTLES DRAWN AEROBIC ONLY Blood Culture adequate volume Performed at [facility], [place] [address] [place], [place] [address]    Culture   Final    NO GROWTH 4 DAYS Performed at [facility], [place] [address], [place], [place] [address]    Report Status PENDING  Incomplete         Radiology Studies: DG Chest Port 1 View  Result Date: 09/18/2019 CLINICAL  DATA:  Dyspnea EXAM: PORTABLE CHEST 1 VIEW COMPARISON:  09/13/2019 FINDINGS: Single frontal view of the chest was obtained. Interval removal of endotracheal and enteric catheters. The cardiac silhouette is unchanged. Increased density at the right lung base consistent with consolidation and small effusion. Improved aeration of the left lung base. No pneumothorax. IMPRESSION: 1. Right basilar consolidation and small effusion. Electronically Signed   By: Randa Ngo M.D.   On: 09/18/2019 08:55        Scheduled Meds: . amLODipine  5 mg Oral Daily  . apixaban  5 mg Oral BID  . chlorhexidine  15 mL Mouth/Throat BID  . Chlorhexidine Gluconate Cloth  6 each Topical Daily  . chlorpheniramine-HYDROcodone  5 mL Oral Q12H  . feeding supplement (ENSURE ENLIVE)  237 mL Oral TID BM  . insulin aspart  0-15 Units Subcutaneous TID WC  . insulin aspart  0-5 Units Subcutaneous QHS  . insulin aspart  4 Units Subcutaneous TID WC  . insulin detemir  30 Units Subcutaneous BID  . Ipratropium-Albuterol  1 puff Inhalation Q6H  . lisinopril  10 mg Oral Daily  . metoprolol tartrate  25 mg Oral BID  . multivitamin with minerals  1 tablet Oral Daily  . QUEtiapine  250 mg Oral QHS  . valproic acid  500 mg Oral Daily   Continuous Infusions:   LOS: 11 days   Time spent= 35 mins    Ilda Laskin Chirag Khalilah Hoke,  MD [facility]  If 7PM-7AM, please contact night-coverage  09/18/2019, 11:24 AM

## 2019-09-18 NOTE — TOC Progression Note (Addendum)
Transition of Care [facility]) - Progression Note    Patient Details  Name: Becky Hendricks MRN: [id number] Date of Birth: 1945/08/31  Transition [facility] Care [facility] CM/SW Contact  Golda Acre, RN Phone Number: 09/18/2019, 4:04 PM  Clinical Narrative:    tct-brother/was at [facility] does not sister going back to there.  Would prefer [facility] or [facility] where she is from. opnly two snf that have accept patient are [name] and [facility].  [facility] beds offered in [facility]. Or [facility].  Expected Discharge Plan: Skilled Nursing Facility Barriers to Discharge: Continued Medical Work up, SNF Pending bed offer  Expected Discharge Plan and Services Expected Discharge Plan: Skilled Nursing Facility   Discharge Planning Services: CM Consult Post Acute Care Choice: Skilled Nursing Facility Living arrangements for the past 2 months: Single Family Home                                       Social Determinants of Health (SDOH) Interventions    Readmission Risk Interventions No flowsheet data found.

## 2019-09-18 NOTE — Plan of Care (Signed)
  Problem: Education: Goal: Knowledge of risk factors and measures for prevention of condition will improve Outcome: Progressing   Problem: Coping: Goal: Psychosocial and spiritual needs will be supported Outcome: Progressing   Problem: Respiratory: Goal: Will maintain a patent airway Outcome: Progressing Goal: Complications related to the disease process, condition or treatment will be avoided or minimized Outcome: Progressing   

## 2019-09-19 LAB — GLUCOSE, CAPILLARY
Glucose-Capillary: 101 mg/dL — ABNORMAL HIGH (ref 70–99)
Glucose-Capillary: 201 mg/dL — ABNORMAL HIGH (ref 70–99)
Glucose-Capillary: 300 mg/dL — ABNORMAL HIGH (ref 70–99)
Glucose-Capillary: 84 mg/dL (ref 70–99)

## 2019-09-19 MED ORDER — INSULIN ASPART 100 UNIT/ML ~~LOC~~ SOLN
6.0000 [IU] | Freq: Three times a day (TID) | SUBCUTANEOUS | Status: DC
  Administered 2019-09-19 – 2019-09-22 (×10): 6 [IU] via SUBCUTANEOUS

## 2019-09-19 MED ORDER — COLLAGENASE 250 UNIT/GM EX OINT
TOPICAL_OINTMENT | Freq: Every day | CUTANEOUS | Status: DC
  Filled 2019-09-19 (×2): qty 30

## 2019-09-19 MED ORDER — INSULIN ASPART 100 UNIT/ML ~~LOC~~ SOLN
2.0000 [IU] | Freq: Once | SUBCUTANEOUS | Status: AC
  Administered 2019-09-19: 2 [IU] via SUBCUTANEOUS

## 2019-09-19 NOTE — Plan of Care (Signed)
Patient has been without issue since arriving to room 316. VSS, remains confused but is oriented to person and year. Wound care provided as ordered. Turn q2hrs, but patient does not stay when turned d/t confusion. Retention of education is poor d/t confusion as well. Remains on RA. Currently waiting on placement to SNF. Call light in reach, bed alarm in place.   Problem: Education: Goal: Knowledge of risk factors and measures for prevention of condition will improve Outcome: Not Progressing   Problem: Coping: Goal: Psychosocial and spiritual needs will be supported Outcome: Progressing   Problem: Respiratory: Goal: Will maintain a patent airway Outcome: Progressing Goal: Complications related to the disease process, condition or treatment will be avoided or minimized Outcome: Progressing

## 2019-09-19 NOTE — Progress Notes (Signed)
Called and spoke with patient's brother, [name]. Updated on POC and answered all questions.

## 2019-09-19 NOTE — Progress Notes (Signed)
PROGRESS NOTE    Becky Hendricks  SVX:[id number] DOB: Feb 13, 1946 DOA: 09/07/2019 PCP: System, Pcp Not In   Brief Narrative:  74 year old with history of atrial fibrillation, bipolar disorder, DM2, morbid obesity, recent C. difficile infection was brought to the hospital for change in mental status.  She was admitted to [facility] last month diagnosed with C. difficile and eventually sent to SNF in [place].  At the rehab facility progressively worsened with labored breathing therefore brought to the hospital.  She was intubated and transferred to [facility] for further management.  Due to concerns of UTI and bacterial pneumonia she was started on vancomycin and cefepime.  She was also given a course of Rocephin.  Urine cultures grew Klebsiella.  CT head was negative for acute pathology.  She completed treatment of remdesivir.  Eventually extubated on 2/1.  Over the course of several days she was weaned off oxygen.  Eventually physical therapist recommended skilled nursing facility, speech and swallow recommended dysphagia 3 diet.  Case management was consulted.  Currently working on placement for her.   Assessment & Plan:   Principal Problem:   Pneumonia due to COVID-19 virus Active Problems:   AKI (acute kidney injury) (HCC)   Pressure injury of skin   Respiratory failure with hypoxia (HCC)   Acute metabolic encephalopathy   UTI (urinary tract infection), bacterial  Acute hypoxic respiratory failure secondary to COVID-19 pneumonia -Greatly improved.  Completed remdesivir and Decadron course.  Now on room air -Continue with bronchodilators, incentive spirometer, flutter valve -Inflammatory markers improved -Supportive care.  Sepsis secondary to urinary tract infection/Klebsiella and COVID-19 pneumonia; Improved.  -Improved.  Completed vancomycin and cefepime -Discontinue Foley catheter  Diabetes mellitus type 2, uncontrolled secondary to hyperglycemia -Hemoglobin A1c 9.5 -Lantus and  ISS.  Chronic atrial fibrillation -Continue Eliquis and metoprolol 25 mg twice daily  Essential hypertension -Norvasc 5 mg daily, metoprolol 25 mg twice daily  Bipolar disorder -Continue quetiapine and valproic acid  Multiple coccyx ulcers/pressure ulcers -Air mattress  Patient will require extensive physical therapy  DVT prophylaxis: Eliquis Code Status: Full code Family Communication: None Disposition Plan:   Patient From= SNF, [place]  Patient Anticipated D/C place= SNF  Barriers= patient is medically doing much better, awaiting transfer to skilled nursing facility.  TOC team working with the family.  Unsafe to be discharged home as patient is still requiring significant amount of nursing care.  Consultants:   None  Subjective: Overall feels great, no complaints.  No acute events overnight.  Review of Systems Otherwise negative except as per HPI, including: General = no fevers, chills, dizziness,  fatigue HEENT/EYES = negative for loss of vision, double vision, blurred vision,  sore throa Cardiovascular= negative for chest pain, palpitation Respiratory/lungs= negative for shortness of breath, cough, wheezing; hemoptysis,  Gastrointestinal= negative for nausea, vomiting, abdominal pain Genitourinary= negative for Dysuria MSK = Negative for arthralgia, myalgias Neurology= Negative for headache, numbness, tingling  Psychiatry= Negative for suicidal and homocidal ideation Skin= Negative for Rash   Examination: Constitutional: Not in acute distress, appears chronically ill Respiratory: Clear to auscultation bilaterally Cardiovascular: Normal sinus rhythm, no rubs Abdomen: Nontender nondistended good bowel sounds Musculoskeletal: No edema noted.  Strength in all extremities 4/5 Skin: No rashes seen Neurologic: CN 2-12 grossly intact.  And nonfocal Psychiatric: Normal judgment and insight. Alert and oriented x 3. Normal mood.   Objective: Vitals:   09/18/19 1700  09/18/19 2005 09/19/19 0332 09/19/19 0743  BP: (!) 152/77 (!) 147/72 131/79 (!) 172/89  Pulse:  65 91 67 71  Resp: (!) 22 19 20  (!) 22  Temp: 98.1 F (36.7 C) 98.9 F (37.2 C) 98.4 F (36.9 C) 98.7 F (37.1 C)  TempSrc: Oral Oral Oral Oral  SpO2: 95% 93% 94% 94%  Weight:      Height:        Intake/Output Summary (Last 24 hours) at 09/19/2019 1004 Last data filed at 09/19/2019 0300 Gross per 24 hour  Intake 450 ml  Output 900 ml  Net -450 ml   Filed Weights   09/13/19 1954 09/15/19 0500 09/16/19 0500  Weight: 111.6 kg 106 kg 108.8 kg     Data Reviewed:   CBC: Recent Labs  Lab 09/13/19 0310 09/14/19 0430 09/15/19 0720 09/16/19 0157  WBC 14.0* 18.1* 14.2* 11.8*  NEUTROABS  --   --   --  8.9*  HGB 11.4* 11.5* 12.4 12.2  HCT 36.3 36.0 38.7 38.5  MCV 95.0 96.0 96.0 94.6  PLT 185 189 231 258   Basic Metabolic Panel: Recent Labs  Lab 09/13/19 0310 09/13/19 0310 09/14/19 0430 09/15/19 0720 09/16/19 0157 09/17/19 0350 09/18/19 0050  NA 142   < > 139 141 141 141 141  K 3.6   < > 4.3 3.8 4.0 4.3 3.4*  CL 97*   < > 96* 101 99 98 102  CO2 32   < > 31 30 31 29 29   GLUCOSE 223*   < > 310* 162* 201* 198* 177*  BUN 49*   < > 56* 40* 41* 38* 35*  CREATININE 0.58   < > 0.62 0.53 0.52 0.61 0.63  CALCIUM 8.8*   < > 8.6* 8.5* 8.8* 8.9 8.5*  MG 1.8  --  2.1  --   --   --   --   PHOS 4.0  --  5.1*  --   --   --   --    < > = values in this interval not displayed.   GFR: Estimated Creatinine Clearance: 80.5 mL/min (by C-G formula based on SCr of 0.63 mg/dL). Liver Function Tests: Recent Labs  Lab 09/16/19 0157 09/17/19 0350 09/18/19 0050  AST 21 25 33  ALT 26 33 34  ALKPHOS 81 84 85  BILITOT 0.9 1.0 0.6  PROT 6.1* 6.4* 5.6*  ALBUMIN 2.2* 2.4* 2.1*   No results for input(s): LIPASE, AMYLASE in the last 168 hours. No results for input(s): AMMONIA in the last 168 hours. Coagulation Profile: No results for input(s): INR, PROTIME in the last 168 hours. Cardiac  Enzymes: No results for input(s): CKTOTAL, CKMB, CKMBINDEX, TROPONINI in the last 168 hours. BNP (last 3 results) No results for input(s): PROBNP in the last 8760 hours. HbA1C: No results for input(s): HGBA1C in the last 72 hours. CBG: Recent Labs  Lab 09/18/19 0736 09/18/19 1149 09/18/19 1552 09/18/19 2010 09/19/19 0730  GLUCAP 100* 127* 243* 208* 101*   Lipid Profile: No results for input(s): CHOL, HDL, LDLCALC, TRIG, CHOLHDL, LDLDIRECT in the last 72 hours. Thyroid Function Tests: No results for input(s): TSH, T4TOTAL, FREET4, T3FREE, THYROIDAB in the last 72 hours. Anemia Panel: Recent Labs    09/17/19 0350 09/18/19 0050  FERRITIN 413* 394*   Sepsis Labs: No results for input(s): PROCALCITON, LATICACIDVEN in the last 168 hours.  Recent Results (from the past 240 hour(s))  Culture, blood (Routine X 2) w Reflex to ID Panel     Status: None   Collection Time: 09/12/19  5:13 PM   Specimen: BLOOD  LEFT FOREARM  Result Value Ref Range Status   Specimen Description   Final    BLOOD LEFT FOREARM Performed at [facility], [address], [place], [place] [address]    Special Requests   Final    BOTTLES DRAWN AEROBIC ONLY Blood Culture adequate volume Performed at [facility], [address] [place], [place] [address]    Culture   Final    NO GROWTH 5 DAYS Performed at [facility], [address], [place], [place] [address]    Report Status 09/18/2019 FINAL  Final  Culture, Urine     Status: None   Collection Time: 09/12/19  5:14 PM   Specimen: Urine, Catheterized  Result Value Ref Range Status   Specimen Description   Final    URINE, CATHETERIZED Performed at [facility], [address], [place], [place] [address]    Special Requests   Final    NONE Performed at [facility], [address], [place], [place] [address]    Culture   Final    NO GROWTH Performed at [facility], [address], [place], [place] [address]    Report Status 09/14/2019 FINAL  Final  Culture, respiratory (non-expectorated)     Status: None   Collection Time: 09/12/19  5:14 PM   Specimen: Tracheal Aspirate; Respiratory  Result Value Ref Range Status   Specimen Description   Final    TRACHEAL ASPIRATE Performed at [facility], [address] [place], [place] [address]    Special Requests   Final    NONE Performed at [facility], [address] [place], [place] [address]    Gram Stain   Final    MODERATE WBC PRESENT, PREDOMINANTLY PMN FEW GRAM NEGATIVE RODS FEW GRAM POSITIVE COCCI IN CLUSTERS RARE GRAM POSITIVE RODS Performed at [facility], [address] [place], [place] [address]    Culture   Final    FEW METHICILLIN RESISTANT STAPHYLOCOCCUS AUREUS FEW ENTEROCOCCUS FAECALIS    Report Status 09/17/2019 FINAL  Final   Organism ID, Bacteria METHICILLIN RESISTANT STAPHYLOCOCCUS AUREUS  Final   Organism ID, Bacteria ENTEROCOCCUS FAECALIS  Final      Susceptibility   Enterococcus faecalis - MIC*    AMPICILLIN <=2 SENSITIVE Sensitive     VANCOMYCIN 1 SENSITIVE Sensitive     GENTAMICIN SYNERGY SENSITIVE Sensitive     * FEW ENTEROCOCCUS FAECALIS   Methicillin resistant staphylococcus aureus - MIC*    CIPROFLOXACIN >=8 RESISTANT Resistant     ERYTHROMYCIN >=8 RESISTANT Resistant     GENTAMICIN <=0.5 SENSITIVE Sensitive     OXACILLIN >=4 RESISTANT Resistant     TETRACYCLINE <=1 SENSITIVE Sensitive     VANCOMYCIN 1 SENSITIVE Sensitive     TRIMETH/SULFA >=320 RESISTANT Resistant     CLINDAMYCIN <=0.25 SENSITIVE Sensitive     RIFAMPIN <=0.5 SENSITIVE Sensitive     Inducible Clindamycin NEGATIVE Sensitive     * FEW METHICILLIN RESISTANT STAPHYLOCOCCUS AUREUS  Culture, blood (Routine X 2) w Reflex to ID Panel     Status: None   Collection Time: 09/12/19  5:18 PM   Specimen: BLOOD  Result Value Ref Range Status    Specimen Description BLOOD RIGHT ANTECUBITAL  Final   Special Requests   Final    BOTTLES DRAWN AEROBIC ONLY Blood Culture adequate volume Performed at [facility], [address] [place], [place]  [address]    Culture   Final    NO GROWTH 5 DAYS Performed at [facility], [place] [address], [place], [place] [address]    Report Status 09/18/2019 FINAL  Final         Radiology Studies: DG Chest Port 1 View  Result Date: 09/18/2019 CLINICAL DATA:  Dyspnea EXAM: PORTABLE CHEST 1 VIEW COMPARISON:  09/13/2019 FINDINGS: Single frontal view of the chest was obtained. Interval removal of endotracheal and enteric catheters. The cardiac silhouette is unchanged. Increased density at the right lung base consistent with consolidation and small effusion. Improved aeration of the left lung base. No pneumothorax. IMPRESSION: 1. Right basilar consolidation and small effusion. Electronically Signed   By: Randa Ngo M.D.   On: 09/18/2019 08:55        Scheduled Meds: . amLODipine  5 mg Oral Daily  . apixaban  5 mg Oral BID  . chlorhexidine  15 mL Mouth/Throat BID  . Chlorhexidine Gluconate Cloth  6 each Topical Daily  . chlorpheniramine-HYDROcodone  5 mL Oral Q12H  . feeding supplement (ENSURE ENLIVE)  237 mL Oral TID BM  . insulin aspart  0-15 Units Subcutaneous TID WC  . insulin aspart  0-5 Units Subcutaneous QHS  . insulin aspart  4 Units Subcutaneous TID WC  . insulin detemir  30 Units Subcutaneous BID  . Ipratropium-Albuterol  1 puff Inhalation Q6H  . lisinopril  10 mg Oral Daily  . metoprolol tartrate  25 mg Oral BID  . multivitamin with minerals  1 tablet Oral Daily  . QUEtiapine  250 mg Oral QHS  . valproic acid  500 mg Oral Daily   Continuous Infusions:   LOS: 12 days   Time spent= 35 mins    Xoey Warmoth Arsenio Loader, MD [facility]  If 7PM-7AM, please contact night-coverage  09/19/2019, 10:04 AM

## 2019-09-20 LAB — BASIC METABOLIC PANEL
Anion gap: 9 (ref 5–15)
BUN: 23 mg/dL (ref 8–23)
CO2: 31 mmol/L (ref 22–32)
Calcium: 8.8 mg/dL — ABNORMAL LOW (ref 8.9–10.3)
Chloride: 102 mmol/L (ref 98–111)
Creatinine, Ser: 0.51 mg/dL (ref 0.44–1.00)
GFR calc Af Amer: >60 mL/min (ref >60)
GFR calc non Af Amer: >60 mL/min (ref >60)
Glucose, Bld: 111 mg/dL — ABNORMAL HIGH (ref 70–99)
Potassium: 4 mmol/L (ref 3.5–5.1)
Sodium: 142 mmol/L (ref 135–145)

## 2019-09-20 LAB — GLUCOSE, CAPILLARY
Glucose-Capillary: 109 mg/dL — ABNORMAL HIGH (ref 70–99)
Glucose-Capillary: 212 mg/dL — ABNORMAL HIGH (ref 70–99)
Glucose-Capillary: 148 mg/dL — ABNORMAL HIGH (ref 70–99)
Glucose-Capillary: 231 mg/dL — ABNORMAL HIGH (ref 70–99)

## 2019-09-20 LAB — MAGNESIUM: Magnesium: 2.1 mg/dL (ref 1.7–2.4)

## 2019-09-20 MED ORDER — AMLODIPINE BESYLATE 10 MG PO TABS
10.0000 mg | ORAL_TABLET | Freq: Every day | ORAL | Status: DC
  Administered 2019-09-20 – 2019-09-22 (×3): 10 mg via ORAL
  Filled 2019-09-20 (×3): qty 1

## 2019-09-20 NOTE — Progress Notes (Signed)
Pt on eliquis

## 2019-09-20 NOTE — Progress Notes (Signed)
PROGRESS NOTE    Becky Hendricks  TMH:[id number] DOB: [birth year] DOA: 09/07/2019 PCP: System, Pcp Not In   Brief Narrative:  74 year old with history of atrial fibrillation, bipolar disorder, DM2, morbid obesity, recent C. difficile infection was brought to the hospital for change in mental status.  She was admitted to [facility] last month diagnosed with C. difficile and eventually sent to SNF in [place].  At the rehab facility progressively worsened with labored breathing therefore brought to the hospital.  She was intubated and transferred to [facility] for further management.  Due to concerns of UTI and bacterial pneumonia she was started on vancomycin and cefepime.  She was also given a course of Rocephin.  Urine cultures grew Klebsiella.  CT head was negative for acute pathology.  She completed treatment of remdesivir.  Eventually extubated on 2/1.  Over the course of several days she was weaned off oxygen.  Eventually physical therapist recommended skilled nursing facility, speech and swallow recommended dysphagia 3 diet.  Case management was consulted.  Currently working on placement for her.   Assessment & Plan:   Principal Problem:   Pneumonia due to COVID-19 virus Active Problems:   AKI (acute kidney injury) (HCC)   Pressure injury of skin   Respiratory failure with hypoxia (HCC)   Acute metabolic encephalopathy   UTI (urinary tract infection), bacterial  Acute hypoxic respiratory failure secondary to COVID-19 pneumonia, stable -Improved.  Completed course of remdesivir and Decadron. -Continue with bronchodilators, incentive spirometer, flutter valve -Inflammatory markers improved -Supportive care.  Sepsis secondary to urinary tract infection/Klebsiella and COVID-19 pneumonia; present on admission.  Improved.  -Improved.  Completed vancomycin and cefepime -Discontinue Foley catheter  Diabetes mellitus type 2, uncontrolled secondary to hyperglycemia -Hemoglobin A1c 9.5 -Lantus and  ISS.  Chronic atrial fibrillation -Continue Eliquis and metoprolol 25 mg twice daily  Essential hypertension, uncontrolled -Increase Norvasc to 10 mg, metoprolol 25 mg twice daily  Bipolar disorder -Continue quetiapine and valproic acid  Multiple coccyx ulcers/pressure ulcers -Air mattress  Will require extensive physical therapy.  DVT prophylaxis: Eliquis Code Status: Full code Family Communication: None Disposition Plan:   Patient From= SNF,   Patient Anticipated D/C place= SNF  Barriers= medically stable and doing much better awaiting safe disposition to skilled nursing facility.  In the meantime she does require nursing supervision as she makes progress towards recovery  Consultants:   None  Subjective: Laying in the bed, no complaints.  Appetite somewhat remains poor therefore I explained her how important it is to increase her oral intake.  Review of Systems Otherwise negative except as per HPI, including: General = no fevers, chills, dizziness,  fatigue HEENT/EYES = negative for loss of vision, double vision, blurred vision,  sore throa Cardiovascular= negative for chest pain, palpitation Respiratory/lungs= negative for shortness of breath, cough, wheezing; hemoptysis,  Gastrointestinal= negative for nausea, vomiting, abdominal pain Genitourinary= negative for Dysuria MSK = Negative for arthralgia, myalgias Neurology= Negative for headache, numbness, tingling  Psychiatry= Negative for suicidal and homocidal ideation Skin= Negative for Rash   Examination: Constitutional: Not in acute distress, appears chronically ill Respiratory: Clear to auscultation bilaterally Cardiovascular: Normal sinus rhythm, no rubs Abdomen: Nontender nondistended good bowel sounds Musculoskeletal: No edema noted.  Strength in all extremities 4/5 Skin: No rashes seen Neurologic: CN 2-12 grossly intact.  And nonfocal Psychiatric: Normal judgment and insight. Alert and oriented  x 3. Normal mood.    Objective: Vitals:   09/19/19 2000 09/20/19 0400 09/20/19 0500 09/20/19 0721  BP: Marland Kitchen)  169/80 (!) 128/59  (!) 161/75  Pulse: 63 60  (!) 53  Resp: 18 20  18   Temp: 98.3 F (36.8 C) 98.2 F (36.8 C)  97.8 F (36.6 C)  TempSrc: Oral Axillary  Oral  SpO2: 94% 97%  98%  Weight:   110.8 kg   Height:        Intake/Output Summary (Last 24 hours) at 09/20/2019 1050 Last data filed at 09/20/2019 1045 Gross per 24 hour  Intake 840 ml  Output 700 ml  Net 140 ml   Filed Weights   09/15/19 0500 09/16/19 0500 09/20/19 0500  Weight: 106 kg 108.8 kg 110.8 kg     Data Reviewed:   CBC: Recent Labs  Lab 09/14/19 0430 09/15/19 0720 09/16/19 0157  WBC 18.1* 14.2* 11.8*  NEUTROABS  --   --  8.9*  HGB 11.5* 12.4 12.2  HCT 36.0 38.7 38.5  MCV 96.0 96.0 94.6  PLT 189 231 778   Basic Metabolic Panel: Recent Labs  Lab 09/14/19 0430 09/14/19 0430 09/15/19 0720 09/16/19 0157 09/17/19 0350 09/18/19 0050 09/20/19 0610  NA 139   < > 141 141 141 141 142  K 4.3   < > 3.8 4.0 4.3 3.4* 4.0  CL 96*   < > 101 99 98 102 102  CO2 31   < > 30 31 29 29 31   GLUCOSE 310*   < > 162* 201* 198* 177* 111*  BUN 56*   < > 40* 41* 38* 35* 23  CREATININE 0.62   < > 0.53 0.52 0.61 0.63 0.51  CALCIUM 8.6*   < > 8.5* 8.8* 8.9 8.5* 8.8*  MG 2.1  --   --   --   --   --  2.1  PHOS 5.1*  --   --   --   --   --   --    < > = values in this interval not displayed.   GFR: Estimated Creatinine Clearance: 81.4 mL/min (by C-G formula based on SCr of 0.51 mg/dL). Liver Function Tests: Recent Labs  Lab 09/16/19 0157 09/17/19 0350 09/18/19 0050  AST 21 25 33  ALT 26 33 34  ALKPHOS 81 84 85  BILITOT 0.9 1.0 0.6  PROT 6.1* 6.4* 5.6*  ALBUMIN 2.2* 2.4* 2.1*   No results for input(s): LIPASE, AMYLASE in the last 168 hours. No results for input(s): AMMONIA in the last 168 hours. Coagulation Profile: No results for input(s): INR, PROTIME in the last 168 hours. Cardiac Enzymes: No  results for input(s): CKTOTAL, CKMB, CKMBINDEX, TROPONINI in the last 168 hours. BNP (last 3 results) No results for input(s): PROBNP in the last 8760 hours. HbA1C: No results for input(s): HGBA1C in the last 72 hours. CBG: Recent Labs  Lab 09/19/19 0730 09/19/19 1124 09/19/19 1642 09/19/19 2119 09/20/19 0719  GLUCAP 101* 201* 300* 84 109*   Lipid Profile: No results for input(s): CHOL, HDL, LDLCALC, TRIG, CHOLHDL, LDLDIRECT in the last 72 hours. Thyroid Function Tests: No results for input(s): TSH, T4TOTAL, FREET4, T3FREE, THYROIDAB in the last 72 hours. Anemia Panel: Recent Labs    09/18/19 0050  FERRITIN 394*   Sepsis Labs: No results for input(s): PROCALCITON, LATICACIDVEN in the last 168 hours.  Recent Results (from the past 240 hour(s))  Culture, blood (Routine X 2) w Reflex to ID Panel     Status: None   Collection Time: 09/12/19  5:13 PM   Specimen: BLOOD LEFT FOREARM  Result Value Ref  Range Status   Specimen Description   Final    BLOOD LEFT FOREARM Performed at [facility], [address] [place], [place] [address]    Special Requests   Final    BOTTLES DRAWN AEROBIC ONLY Blood Culture adequate volume Performed at [facility], [address], [place], [place] [address]    Culture   Final    NO GROWTH 5 DAYS Performed at [facility], [address] [place], [place] [address]    Report Status 09/18/2019 FINAL  Final  Culture, Urine     Status: None   Collection Time: 09/12/19  5:14 PM   Specimen: Urine, Catheterized  Result Value Ref Range Status   Specimen Description   Final    URINE, CATHETERIZED Performed at [facility], [address] [place], [place] [address]    Special Requests   Final    NONE Performed at [facility], [address] [place], [place] [address]    Culture   Final    NO GROWTH Performed at [facility], [address],  [place], [place] [address]    Report Status 09/14/2019 FINAL  Final  Culture, respiratory (non-expectorated)     Status: None   Collection Time: 09/12/19  5:14 PM   Specimen: Tracheal Aspirate; Respiratory  Result Value Ref Range Status   Specimen Description   Final    TRACHEAL ASPIRATE Performed at [facility], [address] [place], [place] [address]    Special Requests   Final    NONE Performed at [facility], [address] [place], [place] [address]    Gram Stain   Final    MODERATE WBC PRESENT, PREDOMINANTLY PMN FEW GRAM NEGATIVE RODS FEW GRAM POSITIVE COCCI IN CLUSTERS RARE GRAM POSITIVE RODS Performed at [facility], [address], [place], [place] [address]    Culture   Final    FEW METHICILLIN RESISTANT STAPHYLOCOCCUS AUREUS FEW ENTEROCOCCUS FAECALIS    Report Status 09/17/2019 FINAL  Final   Organism ID, Bacteria METHICILLIN RESISTANT STAPHYLOCOCCUS AUREUS  Final   Organism ID, Bacteria ENTEROCOCCUS FAECALIS  Final      Susceptibility   Enterococcus faecalis - MIC*    AMPICILLIN <=2 SENSITIVE Sensitive     VANCOMYCIN 1 SENSITIVE Sensitive     GENTAMICIN SYNERGY SENSITIVE Sensitive     * FEW ENTEROCOCCUS FAECALIS   Methicillin resistant staphylococcus aureus - MIC*    CIPROFLOXACIN >=8 RESISTANT Resistant     ERYTHROMYCIN >=8 RESISTANT Resistant     GENTAMICIN <=0.5 SENSITIVE Sensitive     OXACILLIN >=4 RESISTANT Resistant     TETRACYCLINE <=1 SENSITIVE Sensitive     VANCOMYCIN 1 SENSITIVE Sensitive     TRIMETH/SULFA >=320 RESISTANT Resistant     CLINDAMYCIN <=0.25 SENSITIVE Sensitive     RIFAMPIN <=0.5 SENSITIVE Sensitive     Inducible Clindamycin NEGATIVE Sensitive     * FEW METHICILLIN RESISTANT STAPHYLOCOCCUS AUREUS  Culture, blood (Routine X 2) w Reflex to ID Panel     Status: None   Collection Time: 09/12/19  5:18 PM   Specimen: BLOOD  Result Value Ref Range Status   Specimen Description BLOOD RIGHT  ANTECUBITAL  Final   Special Requests   Final    BOTTLES DRAWN AEROBIC ONLY Blood Culture adequate volume Performed at [facility], [address], [place], [place] [address]    Culture  Final    NO GROWTH 5 DAYS Performed at [facility], [address], [place], [place] [address]    Report Status 09/18/2019 FINAL  Final         Radiology Studies: No results found.      Scheduled Meds: . amLODipine  10 mg Oral Daily  . apixaban  5 mg Oral BID  . chlorhexidine  15 mL Mouth/Throat BID  . Chlorhexidine Gluconate Cloth  6 each Topical Daily  . chlorpheniramine-HYDROcodone  5 mL Oral Q12H  . collagenase   Topical Daily  . feeding supplement (ENSURE ENLIVE)  237 mL Oral TID BM  . insulin aspart  0-15 Units Subcutaneous TID WC  . insulin aspart  0-5 Units Subcutaneous QHS  . insulin aspart  6 Units Subcutaneous TID WC  . insulin detemir  30 Units Subcutaneous BID  . Ipratropium-Albuterol  1 puff Inhalation Q6H  . lisinopril  10 mg Oral Daily  . metoprolol tartrate  25 mg Oral BID  . multivitamin with minerals  1 tablet Oral Daily  . QUEtiapine  250 mg Oral QHS  . valproic acid  500 mg Oral Daily   Continuous Infusions:   LOS: 13 days   Time spent= 20 mins    Jerni Selmer Joline Maxcy, MD [facility]  If 7PM-7AM, please contact night-coverage  09/20/2019, 10:50 AM

## 2019-09-20 NOTE — Progress Notes (Signed)
Patient is alert with intermitten confusion, will follow commands. Denies pain or discomfort. Purewick placed. During the night this nurse entered the room and the patient had removed her IV and was covered in chocolate ice cream all over the bed. The patient had also fecal smeared. Patient cleaned up and given bath. No more episodes throughout the night. This nurse did attempt to place a new IV, but the patient would cooperate. The patient did rest well throughout the night.

## 2019-09-20 NOTE — TOC Progression Note (Signed)
Transition of Care [facility]) - Progression Note    Patient Details  Name: Becky Hendricks MRN: [id number] Date of Birth: 09-10-1945  Transition of Care [facility]) CM/SW Contact  Gildardo Griffes, [place] Phone Number: [phone] 09/20/2019, 8:29 AM  Clinical Narrative:     CSW attempted to send referral to [facility], requested CSW call back tomorrow as no admissions staff working today. Please follow up Monday at [phone].   CSW reached out to [facility] in [place], no answer, according to website business hours not avail on weekends. Please attempt Monday at [phone].   CSW spoke with patient's son Becky Hendricks, he reports being in agreement with pursuing bed at either [place] or [facility] (no preference) if neither of the above facilities have beds avail in [place] or [place]. CSW informed him we will follow up when admissions is open tomorrow Monday, and will let him know if [facility] or [place] had beds, or if we needed to pursue [place] or [facility] placement.       Expected Discharge Plan: Skilled Nursing Facility Barriers to Discharge: Continued Medical Work up, SNF Pending bed offer  Expected Discharge Plan and Services Expected Discharge Plan: Skilled Nursing Facility   Discharge Planning Services: CM Consult Post Acute Care Choice: Skilled Nursing Facility Living arrangements for the past 2 months: Single Family Home                                       Social Determinants of Health (SDOH) Interventions    Readmission Risk Interventions No flowsheet data found.

## 2019-09-21 LAB — BASIC METABOLIC PANEL
Anion gap: 8 (ref 5–15)
BUN: 25 mg/dL — ABNORMAL HIGH (ref 8–23)
CO2: 31 mmol/L (ref 22–32)
Calcium: 8.9 mg/dL (ref 8.9–10.3)
Chloride: 100 mmol/L (ref 98–111)
Creatinine, Ser: 0.58 mg/dL (ref 0.44–1.00)
GFR calc Af Amer: >60 mL/min (ref >60)
GFR calc non Af Amer: >60 mL/min (ref >60)
Glucose, Bld: 159 mg/dL — ABNORMAL HIGH (ref 70–99)
Potassium: 4.4 mmol/L (ref 3.5–5.1)
Sodium: 139 mmol/L (ref 135–145)

## 2019-09-21 LAB — GLUCOSE, CAPILLARY
Glucose-Capillary: 146 mg/dL — ABNORMAL HIGH (ref 70–99)
Glucose-Capillary: 185 mg/dL — ABNORMAL HIGH (ref 70–99)
Glucose-Capillary: 168 mg/dL — ABNORMAL HIGH (ref 70–99)
Glucose-Capillary: 88 mg/dL (ref 70–99)

## 2019-09-21 LAB — MAGNESIUM: Magnesium: 1.9 mg/dL (ref 1.7–2.4)

## 2019-09-21 MED ORDER — IPRATROPIUM-ALBUTEROL 20-100 MCG/ACT IN AERS: 1.0000 | INHALATION_SPRAY | Freq: Four times a day (QID) | RESPIRATORY_TRACT | Status: DC

## 2019-09-21 NOTE — Progress Notes (Signed)
  Speech Language Pathology Treatment: Dysphagia  Patient Details Name: Becky Hendricks MRN: [id number] DOB: 05-07-1946 Today's Date: 09/21/2019 Time: 4707-6151 SLP Time Calculation (min) (ACUTE ONLY): 16 min  Assessment / Plan / Recommendation Clinical Impression  Pt's vocal intensity continues to be mildly decreased. Treatment focused on observation with regular texture for possible upgrade. Her mastication, manipulation and transfer was adequate without oral residual. No s/s aspiration with regular or thin liquids. Pt able to advance to regular with nursing assisting with feeding when needed and reminders for aspiration precautions if needed. ST to sign off at this time    HPI HPI: 74 y/o F who presented to [facility] on 1/25 with reports of AMS with recent admission to [facility] 12/20 for AMS & C-Diff Colitis and discharged to a SNF in [place]. PMH; DM, morbid obesity, essential HTN, bipolar, A-fib. Prior to this, she was living independently. she was diagnosed with COVID on 1/17. she become progressively worse with confusion, increased WOB. intubated 1/25-09/14/19. Transferred [place] 1/25      SLP Plan  All goals met;Discharge SLP treatment due to (comment)       Recommendations  Diet recommendations: Regular;Thin liquid Liquids provided via: Cup;Straw Medication Administration: Whole meds with liquid Supervision: Patient able to self feed;Staff to assist with self feeding Compensations: Slow rate;Small sips/bites Postural Changes and/or Swallow Maneuvers: Seated upright 90 degrees                Oral Care Recommendations: Oral care BID Follow up Recommendations: None SLP Visit Diagnosis: Dysphagia, unspecified (R13.10) Plan: All goals met;Discharge SLP treatment due to (comment)       GO                Becky Hendricks 09/21/2019, 4:14 PM

## 2019-09-21 NOTE — TOC Progression Note (Signed)
Transition of Care [facility]) - Progression Note    Patient Details  Name: Becky Hendricks MRN: [id number] Date of Birth: 1946-07-21  Transition of Care [facility]) CM/SW Contact  Armanda Heritage, RN Phone Number: 09/21/2019, 4:10 PM  Clinical Narrative: Patient's brother accepts bed offer from [facility]. CM spoke with facility rep who states bed as available tomorrow 09/22/19.        Expected Discharge Plan: Skilled Nursing Facility Barriers to Discharge: Continued Medical Work up, SNF Pending bed offer  Expected Discharge Plan and Services Expected Discharge Plan: Skilled Nursing Facility   Discharge Planning Services: CM Consult Post Acute Care Choice: Skilled Nursing Facility Living arrangements for the past 2 months: Single Family Home                                       Social Determinants of Health (SDOH) Interventions    Readmission Risk Interventions No flowsheet data found.

## 2019-09-21 NOTE — TOC Progression Note (Signed)
Transition of Care [facility]) - Progression Note    Patient Details  Name: Becky Hendricks MRN: [id number] Date of Birth: May 05, 1946  Transition of Care [facility]) CM/SW Contact  Armanda Heritage, RN Phone Number: 09/21/2019, 2:40 PM  Clinical Narrative:    CM made multiple attempts and left multiple voicemails for admission departments in patient's preferred facilities, no response received.  CM spoke with patient's brother about this and presented current bed offers, which are all in [place].  Brother is unfamiliar with this area and requests time to look into the facilities.  CM provided brother with facility names and will follow up later in the day for choice.    [place] number received [phone]  Expected Discharge Plan: Skilled Nursing Facility Barriers to Discharge: Continued Medical Work up, SNF Pending bed offer  Expected Discharge Plan and Services Expected Discharge Plan: Skilled Nursing Facility   Discharge Planning Services: CM Consult Post Acute Care Choice: Skilled Nursing Facility Living arrangements for the past 2 months: Single Family Home                                       Social Determinants of Health (SDOH) Interventions    Readmission Risk Interventions No flowsheet data found.

## 2019-09-21 NOTE — Progress Notes (Signed)
1515Assumed care of pt.  Pt working with PT at this time.  Pt alert oriented able to bear weight at bedside but terrified of falling.  Pt has multiple wounds from previous facility

## 2019-09-21 NOTE — TOC Progression Note (Signed)
Transition of Care [facility]) - Progression Note    Patient Details  Name: [name] MRN: [id number] Date of Birth: 1945-10-07  Transition of Care [facility]) CM/SW Contact  Golda Acre, RN Phone Number: 09/21/2019, 2:38 PM  Clinical Narrative:    pass[facility] number [phone]   Expected Discharge Plan: Skilled Nursing Facility Barriers to Discharge: Continued Medical Work up, SNF Pending bed offer  Expected Discharge Plan and Services Expected Discharge Plan: Skilled Nursing Facility   Discharge Planning Services: CM Consult Post Acute Care Choice: Skilled Nursing Facility Living arrangements for the past 2 months: Single Family Home                                       Social Determinants of Health (SDOH) Interventions    Readmission Risk Interventions No flowsheet data found.

## 2019-09-21 NOTE — Progress Notes (Signed)
Occupational Therapy Treatment Patient Details Name: Becky Hendricks MRN: [id number] DOB: [birth year] Today's Date: 09/21/2019    History of present illness 74 y/o F who presented to [facility] on 1/25 with reports of AMS.  She was recently admitted to [facility] in December for AMS & C-Diff Colitis and discharged to a SNF in [place].  Prior to this, she was living independently. she was diagnosed with COVID on 1/17. she become progressively worse with confusion, increased WOB and acutely AMS the 24 hours prior to admit.  She was intubated in the ER for airway protection and transferred to [facility] 1/25 .  Pt extubated on 09/14/19.     OT comments  Pt continues to make progress in therapy. Pt tolerated sitting edge of bed 15+ min, requiring min to max assist to maintain seated balance. Pt required max verbal and tactile cues to maintain neutral alignment while seated. Noted continual retro and left lateral lean/LOB while seated edge of bed. Pt declined to brush hair while seated edge of bed despite max encouragement and cues. Pt continually stated "Ok what are we doing here" and "lets hurry this up." Pt initially pleasant and agreeable to therapy, however becoming increasingly agitated as session progressed. Attempted to stand two times with pt unable. Pt very fearful of falling, noting to be leaning backwards when attempting to stand. Pt assisted back to bed and positioned for comfort. RN updated. OT will continue to follow acutely.    Follow Up Recommendations  SNF    Equipment Recommendations  Other (comment)(TBD at next venue of care.)    Recommendations for Other Services      Precautions / Restrictions Precautions Precautions: Fall;Other (comment) Precaution Comments: Buttock wound with black eschar Restrictions Weight Bearing Restrictions: No       Mobility Bed Mobility Overal bed mobility: Needs Assistance Bed Mobility: Rolling;Supine to Sit;Sit to Supine Rolling: Total assist;+2 for physical  assistance   Supine to sit: Max assist;+2 for physical assistance Sit to supine: Max assist;+2 for physical assistance   General bed mobility comments: Rolled in bed to change soiled pad and complete toilet hygiene.   Transfers Overall transfer level: Needs assistance Equipment used: 2 person hand held assist             General transfer comment: Attempted to stand two times this date with pt unable. Pt very fearful of falling and resistant to standing. Pt tends to lean backwards when attempting to stand.     Balance Overall balance assessment: Needs assistance Sitting-balance support: Bilateral upper extremity supported(BLE slightly supported on floor. Difficulty due to mattress) Sitting balance-Leahy Scale: Poor       Standing balance-Leahy Scale: (Attempted to stand x 2 with pt unable)                             ADL either performed or assessed with clinical judgement   ADL Overall ADL's : Needs assistance/impaired     Grooming: Maximal assistance;Sitting;Total assistance;Brushing hair                       Toileting- Clothing Manipulation and Hygiene: Bed level;Total assistance;+2 for physical assistance         General ADL Comments: Pt tolerated sitting EOB 15+ min this date requiring min to max assist to maintain seated balance.      Vision       Perception     Praxis  Cognition Arousal/Alertness: Awake/alert Behavior During Therapy: Anxious;Agitated Overall Cognitive Status: Impaired/Different from baseline Area of Impairment: Orientation;Attention;Memory;Following commands;Safety/judgement;Awareness;Problem solving                 Orientation Level: Place;Time;Situation;Disoriented to Current Attention Level: Focused Memory: Decreased recall of precautions;Decreased short-term memory Following Commands: Follows one step commands inconsistently;Follows one step commands with increased time Safety/Judgement:  Decreased awareness of safety;Decreased awareness of deficits Awareness: Intellectual Problem Solving: Slow processing;Decreased initiation;Difficulty sequencing;Requires tactile cues General Comments: Pt fearful of falling. Pt initially pleasant and agreeable to therapy, however becoming increasingly agitated as session progressed.         Exercises     Shoulder Instructions       General Comments Pt on room air with no reports of SOB throughout.     Pertinent Vitals/ Pain       Pain Assessment: No/denies pain  Home Living                                          Prior Functioning/Environment              Frequency           Progress Toward Goals  OT Goals(current goals can now be found in the care plan section)  Progress towards OT goals: Progressing toward goals  ADL Goals Pt Will Perform Eating: with min assist;sitting Pt Will Perform Grooming: with min assist;sitting Pt Will Perform Upper Body Bathing: with min assist;sitting Additional ADL Goal #1: Pt to tolerate sitting edge of bed 10 min with min assist, in preparation for ADLs.  Plan Discharge plan remains appropriate    Co-evaluation    PT/OT/SLP Co-Evaluation/Treatment: Yes Reason for Co-Treatment: Complexity of the patient's impairments (multi-system involvement);Necessary to address cognition/behavior during functional activity;For patient/therapist safety;To address functional/ADL transfers   OT goals addressed during session: ADL's and self-care;Strengthening/ROM      AM-PAC OT "6 Clicks" Daily Activity     Outcome Measure   Help from another person eating meals?: A Little Help from another person taking care of personal grooming?: A Lot Help from another person toileting, which includes using toliet, bedpan, or urinal?: Total Help from another person bathing (including washing, rinsing, drying)?: Total Help from another person to put on and taking off regular upper body  clothing?: Total Help from another person to put on and taking off regular lower body clothing?: Total 6 Click Score: 9    End of Session Equipment Utilized During Treatment: Gait belt  OT Visit Diagnosis: Unsteadiness on feet (R26.81);Muscle weakness (generalized) (M62.81);Other (comment)(BUE impairments)   Activity Tolerance Other (comment)(Limited due to fearfulness of falling and agitation)   Patient Left in bed;with call bell/phone within reach   Nurse Communication Mobility status        Time: 9562-1308 OT Time Calculation (min): 33 min  Charges: OT General Charges $OT Visit: 1 Visit OT Treatments $Therapeutic Activity: 8-22 mins  Mauri Brooklyn OTR/L [phone]   Mauri Brooklyn 09/21/2019, 3:33 PM

## 2019-09-21 NOTE — Progress Notes (Signed)
PROGRESS NOTE    Becky Hendricks  OMA:[id number] DOB: 08-08-1946 DOA: 09/07/2019 PCP: System, Pcp Not In   Brief Narrative:  74 year old with history of atrial fibrillation, bipolar disorder, DM2, morbid obesity, recent C. difficile infection was brought to the hospital for change in mental status.  She was admitted to [facility] last month diagnosed with C. difficile and eventually sent to SNF in [place].  At the rehab facility progressively worsened with labored breathing therefore brought to the hospital.  She was intubated and transferred to [facility] for further management.  Due to concerns of UTI and bacterial pneumonia she was started on vancomycin and cefepime.  She was also given a course of Rocephin.  Urine cultures grew Klebsiella.  CT head was negative for acute pathology.  She completed treatment of remdesivir.  Eventually extubated on 2/1.  Over the course of several days she was weaned off oxygen.  Eventually physical therapist recommended skilled nursing facility, speech and swallow recommended dysphagia 3 diet.  Case management was consulted.  Currently working on placement for her.   Assessment & Plan:   Principal Problem:   Pneumonia due to COVID-19 virus Active Problems:   AKI (acute kidney injury) (HCC)   Pressure injury of skin   Respiratory failure with hypoxia (HCC)   Acute metabolic encephalopathy   UTI (urinary tract infection), bacterial  Acute hypoxic respiratory failure secondary to COVID-19 pneumonia, remains stable -Improved.  Completed course of remdesivir and Decadron. -Continue with bronchodilators, incentive spirometer, flutter valve -Inflammatory markers improved -Supportive care.  Sepsis secondary to urinary tract infection/Klebsiella and COVID-19 pneumonia; present on admission.  Resolved -Completed course of vancomycin/cefepime -Discontinue Foley catheter  Diabetes mellitus type 2, uncontrolled secondary to hyperglycemia -Hemoglobin A1c 9.5 -Lantus and  ISS.  Chronic atrial fibrillation -Continue Eliquis and metoprolol 25 mg twice daily  Essential hypertension, uncontrolled -Increase Norvasc to 10 mg, metoprolol 25 mg twice daily  Bipolar disorder -Continue quetiapine and valproic acid  Multiple coccyx ulcers/pressure ulcers -Air mattress  Will need skilled nursing facility. While patient is here to have explained and requested nursing staff to continue working with patient and aggressively provide supportive care including out of bed to chair, incentive spirometer, flutter valve and assistance with feeding as necessary to keep her stronger while she is awaiting placement.  DVT prophylaxis: Eliquis Code Status: Full code Family Communication: None Disposition Plan:   Patient From= SNF, [facility]  Patient Anticipated D/C place= SNF  Barriers= medically appears to be better, awaiting safe disposition planning to skilled nursing facility in the meantime needs close supervision from the nursing staff.  Unsafe for discharge at home.  Consultants:   None  Subjective: Laying in the bed just waking up during my evaluation.  Her breakfast is sitting at the bedside.  Patient does not have any complaints.  States she was able to rest overnight.  Review of Systems Otherwise negative except as per HPI, including: General = no fevers, chills, dizziness,  fatigue HEENT/EYES = negative for loss of vision, double vision, blurred vision,  sore throa Cardiovascular= negative for chest pain, palpitation Respiratory/lungs= negative for shortness of breath, cough, wheezing; hemoptysis,  Gastrointestinal= negative for nausea, vomiting, abdominal pain Genitourinary= negative for Dysuria MSK = Negative for arthralgia, myalgias Neurology= Negative for headache, numbness, tingling  Psychiatry= Negative for suicidal and homocidal ideation Skin= Negative for Rash   Examination: Constitutional: Not in acute distress, generally she appears  chronically ill Respiratory: Clear to auscultation bilaterally Cardiovascular: Normal sinus rhythm, no rubs Abdomen:  Nontender nondistended good bowel sounds Musculoskeletal: No edema noted Skin: No rashes seen Neurologic: CN 2-12 grossly intact.  And nonfocal Psychiatric: Normal judgment and insight. Alert and oriented x 3. Normal mood.     Objective: Vitals:   09/20/19 2000 09/21/19 0400 09/21/19 0757 09/21/19 0800  BP: (!) 158/70 (!) 152/71 (!) 157/77 (!) 145/68  Pulse: 78 (!) 56 65 60  Resp: 18  20 18   Temp: 98.8 F (37.1 C) 98.4 F (36.9 C) 98.6 F (37 C) 98 F (36.7 C)  TempSrc: Axillary Axillary Axillary Axillary  SpO2: 91% 93% 94% 100%  Weight:      Height:        Intake/Output Summary (Last 24 hours) at 09/21/2019 1331 Last data filed at 09/21/2019 0757 Gross per 24 hour  Intake 480 ml  Output 200 ml  Net 280 ml   Filed Weights   09/15/19 0500 09/16/19 0500 09/20/19 0500  Weight: 106 kg 108.8 kg 110.8 kg     Data Reviewed:   CBC: Recent Labs  Lab 09/15/19 0720 09/16/19 0157  WBC 14.2* 11.8*  NEUTROABS  --  8.9*  HGB 12.4 12.2  HCT 38.7 38.5  MCV 96.0 94.6  PLT 231 258   Basic Metabolic Panel: Recent Labs  Lab 09/16/19 0157 09/17/19 0350 09/18/19 0050 09/20/19 0610 09/21/19 0610  NA 141 141 141 142 139  K 4.0 4.3 3.4* 4.0 4.4  CL 99 98 102 102 100  CO2 31 29 29 31 31   GLUCOSE 201* 198* 177* 111* 159*  BUN 41* 38* 35* 23 25*  CREATININE 0.52 0.61 0.63 0.51 0.58  CALCIUM 8.8* 8.9 8.5* 8.8* 8.9  MG  --   --   --  2.1 1.9   GFR: Estimated Creatinine Clearance: 81.4 mL/min (by C-G formula based on SCr of 0.58 mg/dL). Liver Function Tests: Recent Labs  Lab 09/16/19 0157 09/17/19 0350 09/18/19 0050  AST 21 25 33  ALT 26 33 34  ALKPHOS 81 84 85  BILITOT 0.9 1.0 0.6  PROT 6.1* 6.4* 5.6*  ALBUMIN 2.2* 2.4* 2.1*   No results for input(s): LIPASE, AMYLASE in the last 168 hours. No results for input(s): AMMONIA in the last 168  hours. Coagulation Profile: No results for input(s): INR, PROTIME in the last 168 hours. Cardiac Enzymes: No results for input(s): CKTOTAL, CKMB, CKMBINDEX, TROPONINI in the last 168 hours. BNP (last 3 results) No results for input(s): PROBNP in the last 8760 hours. HbA1C: No results for input(s): HGBA1C in the last 72 hours. CBG: Recent Labs  Lab 09/20/19 1204 09/20/19 1714 09/20/19 2237 09/21/19 0758 09/21/19 1305  GLUCAP 212* 148* 231* 146* 185*   Lipid Profile: No results for input(s): CHOL, HDL, LDLCALC, TRIG, CHOLHDL, LDLDIRECT in the last 72 hours. Thyroid Function Tests: No results for input(s): TSH, T4TOTAL, FREET4, T3FREE, THYROIDAB in the last 72 hours. Anemia Panel: No results for input(s): VITAMINB12, FOLATE, FERRITIN, TIBC, IRON, RETICCTPCT in the last 72 hours. Sepsis Labs: No results for input(s): PROCALCITON, LATICACIDVEN in the last 168 hours.  Recent Results (from the past 240 hour(s))  Culture, blood (Routine X 2) w Reflex to ID Panel     Status: None   Collection Time: 09/12/19  5:13 PM   Specimen: BLOOD LEFT FOREARM  Result Value Ref Range Status   Specimen Description   Final    BLOOD LEFT FOREARM Performed at [facility], [address] [place], [place]    Special Requests  Final    BOTTLES DRAWN AEROBIC ONLY Blood Culture adequate volume Performed at [facility], [address], [place], [place] [address]    Culture   Final    NO GROWTH 5 DAYS Performed at [facility], [address], [place], [place] [address]    Report Status 09/18/2019 FINAL  Final  Culture, Urine     Status: None   Collection Time: 09/12/19  5:14 PM   Specimen: Urine, Catheterized  Result Value Ref Range Status   Specimen Description   Final    URINE, CATHETERIZED Performed at [facility], [address], [place], [place] [address]    Special Requests   Final    NONE Performed at [facility], [address] [place], [place] [address]    Culture   Final    NO GROWTH Performed at [facility], [address] [place], [place] [address]    Report Status 09/14/2019 FINAL  Final  Culture, respiratory (non-expectorated)     Status: None   Collection Time: 09/12/19  5:14 PM   Specimen: Tracheal Aspirate; Respiratory  Result Value Ref Range Status   Specimen Description   Final    TRACHEAL ASPIRATE Performed at [facility], [address] [place], [place] [address]    Special Requests   Final    NONE Performed at [facility], [address] [place], [place] [address]    Gram Stain   Final    MODERATE WBC PRESENT, PREDOMINANTLY PMN FEW GRAM NEGATIVE RODS FEW GRAM POSITIVE COCCI IN CLUSTERS RARE GRAM POSITIVE RODS Performed at [facility], [address], [place], [place] [address]    Culture   Final    FEW METHICILLIN RESISTANT STAPHYLOCOCCUS AUREUS FEW ENTEROCOCCUS FAECALIS    Report Status 09/17/2019 FINAL  Final   Organism ID, Bacteria METHICILLIN RESISTANT STAPHYLOCOCCUS AUREUS  Final   Organism ID, Bacteria ENTEROCOCCUS FAECALIS  Final      Susceptibility   Enterococcus faecalis - MIC*    AMPICILLIN <=2 SENSITIVE Sensitive     VANCOMYCIN 1 SENSITIVE Sensitive     GENTAMICIN SYNERGY SENSITIVE Sensitive     * FEW ENTEROCOCCUS FAECALIS   Methicillin resistant staphylococcus aureus - MIC*    CIPROFLOXACIN >=8 RESISTANT Resistant     ERYTHROMYCIN >=8 RESISTANT Resistant     GENTAMICIN <=0.5 SENSITIVE Sensitive     OXACILLIN >=4 RESISTANT Resistant     TETRACYCLINE <=1 SENSITIVE Sensitive     VANCOMYCIN 1 SENSITIVE Sensitive     TRIMETH/SULFA >=320 RESISTANT Resistant     CLINDAMYCIN <=0.25 SENSITIVE Sensitive     RIFAMPIN <=0.5 SENSITIVE Sensitive     Inducible Clindamycin NEGATIVE Sensitive     * FEW METHICILLIN RESISTANT STAPHYLOCOCCUS AUREUS  Culture, blood (Routine X 2) w Reflex  to ID Panel     Status: None   Collection Time: 09/12/19  5:18 PM   Specimen: BLOOD  Result Value Ref Range Status   Specimen Description BLOOD RIGHT ANTECUBITAL  Final   Special Requests   Final    BOTTLES DRAWN AEROBIC ONLY Blood Culture adequate volume Performed at [facility], [address], [place], [place] [address]    Culture   Final    NO GROWTH 5 DAYS Performed at [facility], [address], [place], [place] [address]    Report Status 09/18/2019 FINAL  Final  Radiology Studies: No results found.      Scheduled Meds: . amLODipine  10 mg Oral Daily  . apixaban  5 mg Oral BID  . chlorhexidine  15 mL Mouth/Throat BID  . Chlorhexidine Gluconate Cloth  6 each Topical Daily  . chlorpheniramine-HYDROcodone  5 mL Oral Q12H  . collagenase   Topical Daily  . feeding supplement (ENSURE ENLIVE)  237 mL Oral TID BM  . insulin aspart  0-15 Units Subcutaneous TID WC  . insulin aspart  0-5 Units Subcutaneous QHS  . insulin aspart  6 Units Subcutaneous TID WC  . insulin detemir  30 Units Subcutaneous BID  . Ipratropium-Albuterol  1 puff Inhalation Q6H  . lisinopril  10 mg Oral Daily  . metoprolol tartrate  25 mg Oral BID  . multivitamin with minerals  1 tablet Oral Daily  . QUEtiapine  250 mg Oral QHS  . valproic acid  500 mg Oral Daily   Continuous Infusions:   LOS: 14 days   Time spent= 20 mins    Aristotle Lieb Arsenio Loader, MD [facility]  If 7PM-7AM, please contact night-coverage  09/21/2019, 1:31 PM

## 2019-09-21 NOTE — Progress Notes (Signed)
Physical Therapy Treatment Patient Details Name: Becky Hendricks MRN: [id number] DOB: 06/27/1946 Today's Date: 09/21/2019    History of Present Illness 74 y/o F who presented to [facility] on 1/25 with reports of AMS.  She was recently admitted to [facility] in December for AMS & C-Diff Colitis and discharged to a SNF in [place].  Prior to this, she was living independently. she was diagnosed with COVID on 1/17. she become progressively worse with confusion, increased WOB and acutely AMS the 24 hours prior to admit.  She was intubated in the ER for airway protection and transferred to [facility] 1/25 .  Pt extubated on 09/14/19.      PT Comments    Pt made significant progress in functional mobility today. Pt tolerated sitting edge of bed >15 min, requiring min to max assist to maintain seated balance; prior attempt was approximately 5 min. Pt required intermittent verbal and tactile cues to maintain neutral alignment while seated, but maintained focus on activity.  Pt demonstrated continual retro and left lateral lean/LOB while seated edge of bed requiring min-max assistance depending on fatigue level. Pt declined to perform self care while work on sitting balance with OT and progressed to UE exercises for ROM. Pt stated multiple times to "get on with it", "go quicker", when cued for self care activities. Attempted to stand two times with pt unable secondary to severe fear of falling and not following cues to shift COG needed to stand. Pt assisted back to bed and positioned for comfort. RN updated. Due to continued weakness, reduced activity tolerance and balance, patient can continue to benefit from skilled PT to improve functional mobility and understanding. Pt will most likely benefit the most from co-treatment to encourage trust for standing and assistance for reduced bed mobility and self care.   Follow Up Recommendations  SNF     Equipment Recommendations  None recommended by PT    Recommendations for Other  Services       Precautions / Restrictions Precautions Precautions: Fall;Other (comment) Precaution Comments: Buttock wound with black eschar Restrictions Weight Bearing Restrictions: No    Mobility  Bed Mobility Overal bed mobility: Needs Assistance Bed Mobility: Rolling;Supine to Sit;Sit to Supine Rolling: Total assist;+2 for physical assistance   Supine to sit: Max assist;+2 for physical assistance Sit to supine: Max assist;+2 for physical assistance   General bed mobility comments: Rolled in bed to change soiled pad and complete toilet hygiene.   Transfers Overall transfer level: Needs assistance Equipment used: 2 person hand held assist;None(Transfer pads)             General transfer comment: Attempted to stand two times this date with pt unable. Pt very fearful of falling and resistant to standing. Pt tends to lean backwards when attempting to stand, not allowing herself to lean nose even over knees. Could benefit from working with same team multiple times to build trust to stand.   Ambulation/Gait                 Stairs             Wheelchair Mobility    Modified Rankin (Stroke Patients Only)       Balance Overall balance assessment: Needs assistance Sitting-balance support: Bilateral upper extremity supported Sitting balance-Leahy Scale: Poor Sitting balance - Comments: able to sit supported edge of bed approx 15 mins intermittently retropulsive needing min-max a to maintain sitting Postural control: Posterior lean   Standing balance-Leahy Scale: (Attempted to stand x 2  with pt unable)                              Cognition Arousal/Alertness: Awake/alert Behavior During Therapy: Anxious;Agitated Overall Cognitive Status: Impaired/Different from baseline Area of Impairment: Orientation;Attention;Memory;Following commands;Safety/judgement;Awareness;Problem solving                 Orientation Level:  Place;Time;Situation;Disoriented to Current Attention Level: Focused Memory: Decreased recall of precautions;Decreased short-term memory Following Commands: Follows one step commands inconsistently;Follows one step commands with increased time Safety/Judgement: Decreased awareness of safety;Decreased awareness of deficits Awareness: Intellectual Problem Solving: Slow processing;Decreased initiation;Difficulty sequencing;Requires tactile cues General Comments: Pt fearful of falling. Pt initially pleasant and agreeable to therapy, however becoming increasingly agitated as session progressed.       Exercises General Exercises - Lower Extremity Ankle Circles/Pumps: AROM;10 reps;Both;Seated Long Arc Quad: AROM;Both;5 reps;Seated Hip Flexion/Marching: AROM;Both;Other (comment)(3 reps due to fatigue and fear of falling) Other Exercises Other Exercises: Sitting balance tolerance with UE support bilaterally 15 min    General Comments General comments (skin integrity, edema, etc.): Pt on room air with no reports of SOB throughout. She demonstrated moderate fatigue, sweating at end of session, but reported being comfortable in bed.      Pertinent Vitals/Pain Pain Assessment: No/denies pain Faces Pain Scale: No hurt    Home Living                      Prior Function            PT Goals (current goals can now be found in the care plan section) Acute Rehab PT Goals PT Goal Formulation: With patient Time For Goal Achievement: 09/29/19 Potential to Achieve Goals: Fair Progress towards PT goals: Progressing toward goals    Frequency    Min 2X/week      PT Plan Current plan remains appropriate    Co-evaluation   Reason for Co-Treatment: Complexity of the patient's impairments (multi-system involvement);Necessary to address cognition/behavior during functional activity;For patient/therapist safety;To address functional/ADL transfers PT goals addressed during session:  Mobility/safety with mobility;Strengthening/ROM;Balance OT goals addressed during session: ADL's and self-care;Strengthening/ROM      AM-PAC PT "6 Clicks" Mobility   Outcome Measure  Help needed turning from your back to your side while in a flat bed without using bedrails?: Total Help needed moving from lying on your back to sitting on the side of a flat bed without using bedrails?: Total Help needed moving to and from a bed to a chair (including a wheelchair)?: Total Help needed standing up from a chair using your arms (e.g., wheelchair or bedside chair)?: Total Help needed to walk in hospital room?: Total Help needed climbing 3-5 steps with a railing? : Total 6 Click Score: 6    End of Session Equipment Utilized During Treatment: Oxygen;Gait belt Activity Tolerance: Treatment limited secondary to medical complications (Comment);Patient limited by lethargy;Patient limited by fatigue Patient left: in bed;with call bell/phone within reach;with bed alarm set Nurse Communication: Mobility status PT Visit Diagnosis: Unsteadiness on feet (R26.81);Muscle weakness (generalized) (M62.81)     Time: 2423-5361 PT Time Calculation (min) (ACUTE ONLY): 33 min  Charges:  $Therapeutic Exercise: 8-22 mins                     Becky Hendricks PT, DPT Acute Rehab Becky Hendricks [facility] P: [phone]    Becky Hendricks A Becky Hendricks 09/21/2019, 5:10 PM

## 2019-09-22 ENCOUNTER — Encounter (HOSPITAL_COMMUNITY): Payer: Self-pay

## 2019-09-22 LAB — GLUCOSE, CAPILLARY
Glucose-Capillary: 143 mg/dL — ABNORMAL HIGH (ref 70–99)
Glucose-Capillary: 133 mg/dL — ABNORMAL HIGH (ref 70–99)
Glucose-Capillary: 271 mg/dL — ABNORMAL HIGH (ref 70–99)
Glucose-Capillary: 191 mg/dL — ABNORMAL HIGH (ref 70–99)
Glucose-Capillary: 98 mg/dL (ref 70–99)

## 2019-09-22 LAB — BASIC METABOLIC PANEL
Anion gap: 7 (ref 5–15)
BUN: 24 mg/dL — ABNORMAL HIGH (ref 8–23)
CO2: 31 mmol/L (ref 22–32)
Calcium: 8.6 mg/dL — ABNORMAL LOW (ref 8.9–10.3)
Chloride: 100 mmol/L (ref 98–111)
Creatinine, Ser: 0.55 mg/dL (ref 0.44–1.00)
GFR calc Af Amer: >60 mL/min (ref >60)
GFR calc non Af Amer: >60 mL/min (ref >60)
Glucose, Bld: 129 mg/dL — ABNORMAL HIGH (ref 70–99)
Potassium: 4.6 mmol/L (ref 3.5–5.1)
Sodium: 138 mmol/L (ref 135–145)

## 2019-09-22 LAB — MAGNESIUM: Magnesium: 2 mg/dL (ref 1.7–2.4)

## 2019-09-22 MED ORDER — FLUCONAZOLE 100 MG PO TABS: 100.0000 mg | ORAL_TABLET | Freq: Every day | ORAL | 0 refills | Status: AC

## 2019-09-22 NOTE — Care Management Important Message (Signed)
Important Message  Patient Details  Name: Becky Hendricks MRN: [id number] Date of Birth: 01-11-1946   Medicare Important Message Given:  Yes - Important Message mailed due to current National Emergency  Verbal consent obtained due to current National Emergency  Relationship to patient: Brother/Sister Contact Name: Najmo Pardue Call Date: 09/22/19  Time: 1317 Phone: [phone] Outcome: Spoke with contact Important Message mailed to: Other (must enter comment)(Declined an additional copy of IM, but is aware one is available if needed)    Orson Aloe 09/22/2019, 1:18 PM

## 2019-09-22 NOTE — TOC Transition Note (Signed)
Transition of Care [facility]) - CM/SW Discharge Note   Patient Details  Name: Becky Hendricks MRN: [id number] Date of Birth: [birth year]  Transition of Care [facility]) CM/SW Contact:  Armanda Heritage, RN Phone Number: 09/22/2019, 12:40 PM   Clinical Narrative:    CM confirmed bed availability at [facility] today. Patient will discharge to room 804A. Patient will be transported by [facility].      Final next level of care: Skilled Nursing Facility Barriers to Discharge: No Barriers Identified   Patient Goals and CMS Choice     Choice offered to / list presented to : Spouse  Discharge Placement                       Discharge Plan and Services   Discharge Planning Services: CM Consult Post Acute Care Choice: Skilled Nursing Facility                               Social Determinants of Health (SDOH) Interventions     Readmission Risk Interventions No flowsheet data found.

## 2019-09-22 NOTE — Progress Notes (Signed)
Report called and given to Conservation officer, historic buildings at [facility]. [phone]

## 2019-09-22 NOTE — Discharge Summary (Signed)
Physician Discharge Summary  Theda SersJanina Weisel ZOX:[id number] DOB: 07/26/1946 DOA: 09/07/2019  PCP: System, Pcp Not In  Admit date: 09/07/2019 Discharge date: 09/22/2019  Admitted From: [facility] Disposition: Skilled nursing facility  Recommendations for Outpatient Follow-up:  1. Follow up with PCP in 1-2 weeks 2. Please obtain BMP/CBC in one week your next doctors visit.  3. For sacral ulcer-dressing procedure/placement/frequency: Santyl discontinued. BID dressings as follows: Place saline moistened gauze to the right buttock and coccyx wound. Top with ABD pads. Tape in place. 4. Recommend outpatient wound care follow-up in 1-2 weeks  Discharge Condition: Stable CODE STATUS: Full code Diet recommendation: Diabetic/heart healthy  Brief/Interim Summary: 74 year old with history of atrial fibrillation, bipolar disorder, DM2, morbid obesity, recent C. difficile infection was brought to the hospital for change in mental status.  She was [facility] month diagnosed with C. difficile and eventually sent to SNF in [place].  At the rehab facility progressively worsened with labored breathing therefore brought to the hospital.  She was intubated and [facility].  Due to concerns of UTI and bacterial pneumonia she was started on vancomycin and cefepime.  She was also given a course of Rocephin.  Urine cultures grew Klebsiella.  CT head was negative for acute pathology.  She completed treatment of remdesivir.  Eventually extubated on 2/1.  Over the course of several days she was weaned off oxygen.  Eventually physical therapist recommended skilled nursing facility, speech and swallow recommended dysphagia 3 diet.  Case management was consulted, who was able to find her placement.  She has unstageable right buttock/coccyx ulcer with large area of black eschar, some yellow sloughing of the skin and minimal drainage.  Santyl was discontinued by the wound  care team, recommended twice daily dressing changes with saline motion gauze to the right buttock and coccyx, top with ABD pad and tape in place.   Acute hypoxic respiratory failure secondary to COVID-19 pneumonia, remains stable -Improved.  Completed course of remdesivir and Decadron. -Continue with bronchodilators, incentive spirometer, flutter valve -Inflammatory markers improved -Supportive care.  Sepsis secondary to urinary tract infection/Klebsiella and COVID-19 pneumonia; present on admission.  Resolved -Completed course of vancomycin/cefepime -Discontinue Foley catheter  Diabetes mellitus type 2, uncontrolled secondary to hyperglycemia -Hemoglobin A1c 9.5 -Resume home regimen  Chronic atrial fibrillation -Continue Eliquis and metoprolol 25 mg twice daily  Essential hypertension, uncontrolled -Increase Norvasc to 10 mg, metoprolol 25 mg twice daily  Bipolar disorder -Continue quetiapine and valproic acid  Multiple coccyx ulcers/pressure ulcers -Air mattress.  Wound care recommendations as stated above. -Fluconazole for 7 days  Discharge Diagnoses:  Principal Problem:   Pneumonia due to COVID-19 virus Active Problems:   AKI (acute kidney injury) (HCC)   Pressure injury of skin   Respiratory failure with hypoxia (HCC)   Acute metabolic encephalopathy   UTI (urinary tract infection), bacterial    Consultations:  Wound care  Subjective: Feels okay, no complaints  Discharge Exam: Vitals:   09/22/19 0610 09/22/19 0758  BP:  (!) 136/91  Pulse:  65  Resp:  18  Temp:  97.7 F (36.5 C)  SpO2: 95% 96%   Vitals:   09/21/19 2100 09/22/19 0409 09/22/19 0610 09/22/19 0758  BP: (!) 152/80 131/65  (!) 136/91  Pulse: 87 61  65  Resp: (!) 21 16  18   Temp: 98.7 F (37.1 C) 97.7 F (36.5 C)  97.7 F (36.5 C)  TempSrc: Oral Oral  Oral  SpO2: 90%  94% 95% 96%  Weight:      Height:        General: Pt is alert, awake, not in acute distress, chronically  ill and frail appearing Cardiovascular: RRR, S1/S2 +, no rubs, no gallops Respiratory: CTA bilaterally, no wheezing, no rhonchi Abdominal: Soft, NT, ND, bowel sounds + Extremities: no edema, no cyanosis  Has a area of sacral ulcer with large eschar with granulation tissue/pink tissue around it.  Examined by me personally with the nursing staff this morning.        Discharge Instructions   Allergies as of 09/22/2019   Not on File     Medication List    TAKE these medications   amLODipine 5 MG tablet Commonly known as: NORVASC Take 5 mg by mouth daily.   apixaban 5 MG Tabs tablet Commonly known as: ELIQUIS Take 5 mg by mouth daily.   ascorbic acid 1000 MG tablet Commonly known as: VITAMIN C Take 500 mg by mouth 2 (two) times daily.   Calcium Carbonate-Vitamin D 600-200 MG-UNIT Tabs Take 1 tablet by mouth daily.   Depakote 500 MG DR tablet Generic drug: divalproex Take 500 mg by mouth daily.   Fish Oil 1200 MG Caps Take 1,200 mg by mouth daily.   fluconazole 100 MG tablet Commonly known as: Diflucan Take 1 tablet (100 mg total) by mouth daily for 7 days.   Ipratropium-Albuterol 20-100 MCG/ACT Aers respimat Commonly known as: COMBIVENT Inhale 1 puff into the lungs every 6 (six) hours.   lisinopril 20 MG tablet Commonly known as: ZESTRIL Take 20 mg by mouth daily.   Melatonin 3 MG Tabs Take 3 mg by mouth at bedtime.   metFORMIN 500 MG 24 hr tablet Commonly known as: GLUCOPHAGE-XR Take 1,000 mg by mouth 2 (two) times daily.   metoprolol tartrate 25 MG tablet Commonly known as: LOPRESSOR Take 25 mg by mouth 2 (two) times daily.   multivitamin with minerals Tabs tablet Take 1 tablet by mouth daily.   QUEtiapine 100 MG tablet Commonly known as: SEROQUEL Take 2.5 tablets by mouth at bedtime.   Victoza 18 MG/3ML Sopn Generic drug: liraglutide Inject 1.8 mg into the skin daily.      Contact information for after-discharge care    Destination     [name]erred [facility] .   Service: Skilled Nursing Contact information: [address] [place] [address] [phone]             Not on File  You were cared for by a hospitalist during your hospital stay. If you have any questions about your discharge medications or the care you received while you were in the hospital after you are discharged, you can call the unit and asked to speak with the hospitalist on call if the hospitalist that took care of you is not available. Once you are discharged, your primary care physician will handle any further medical issues. Please note that no refills for any discharge medications will be authorized once you are discharged, as it is imperative that you return to your primary care physician (or establish a relationship with a primary care physician if you do not have one) for your aftercare needs so that they can reassess your need for medications and monitor your lab values.   Procedures/Studies: DG Chest 1 View  Result Date: 09/07/2019 CLINICAL DATA:  74 year old female COVID-19. Endotracheal tube and enteric tube placement. EXAM: CHEST  1 VIEW COMPARISON:  Portable chest 1302 hours today. FINDINGS:  Portable AP semi upright view at 1802 hours. Endotracheal tube tip in good position between the level the clavicles and carina. Enteric tube courses to the left abdomen, tip not included. Larger lung volumes. Mediastinal contours remain normal. Allowing for portable technique the lungs are clear. No pneumothorax or pleural effusion identified. IMPRESSION: 1. Endotracheal tube and enteric tube appear appropriately placed. 2. Larger lung volumes with clear lungs now when allowing for portable technique. Electronically Signed   By: Genevie Ann M.D.   On: 09/07/2019 18:35   CT Head Wo Contrast  Result Date: 09/07/2019 CLINICAL DATA:  Altered mental status with tachypnea and fever. COVID-19 positive EXAM: CT HEAD WITHOUT CONTRAST TECHNIQUE:  Contiguous axial images were obtained from the base of the skull through the vertex without intravenous contrast. COMPARISON:  None. FINDINGS: Brain: There is mild diffuse atrophy. There is no intracranial mass, hemorrhage, extra-axial fluid collection, or midline shift. There is mild small vessel disease in the centra semiovale bilaterally. No acute infarct is evident on this study. Vascular: There is no hyperdense vessel. No appreciable vascular calcification. Skull: The bony calvarium appears intact. Sinuses/Orbits: There is mucosal thickening and opacification in the posterior sphenoid sinus region. There is mucosal thickening involving multiple ethmoid air cells. Orbits appear symmetric bilaterally. Other: Mastoid air cells are clear. IMPRESSION: Atrophy with mild periventricular small vessel disease. No evident acute infarct. No mass or hemorrhage. There are foci of paranasal sinus disease at several sites. Electronically Signed   By: Lowella Grip III M.D.   On: 09/07/2019 15:13   DG Chest Port 1 View  Result Date: 09/18/2019 CLINICAL DATA:  Dyspnea EXAM: PORTABLE CHEST 1 VIEW COMPARISON:  09/13/2019 FINDINGS: Single frontal view of the chest was obtained. Interval removal of endotracheal and enteric catheters. The cardiac silhouette is unchanged. Increased density at the right lung base consistent with consolidation and small effusion. Improved aeration of the left lung base. No pneumothorax. IMPRESSION: 1. Right basilar consolidation and small effusion. Electronically Signed   By: Randa Ngo M.D.   On: 09/18/2019 08:55   DG Chest Port 1 View  Result Date: 09/13/2019 CLINICAL DATA:  Ventilator support.  Coronavirus infection. EXAM: PORTABLE CHEST 1 VIEW COMPARISON:  09/12/2019 FINDINGS: Endotracheal tube tip is 5 cm above the carina. Orogastric tube enters the stomach. Worsening of patchy and linear atelectasis at the right lung base. Mild atelectasis persists at the left lung base. Upper  lungs appear largely clear. IMPRESSION: Endotracheal tube well positioned. Worsening of volume loss at the right lung base. Similar appearance on the left. Electronically Signed   By: Nelson Chimes M.D.   On: 09/13/2019 06:29   DG Chest Port 1 View  Result Date: 09/12/2019 CLINICAL DATA:  Followup ventilator support EXAM: PORTABLE CHEST 1 VIEW COMPARISON:  09/09/2019 FINDINGS: Endotracheal tube tip remains 4 cm above the carina. Orogastric or nasogastric tube enters the abdomen. Slight worsening of atelectasis at the left lung base. Upper lungs remain clear. IMPRESSION: Slight worsening of atelectasis at the left lung base. Electronically Signed   By: Nelson Chimes M.D.   On: 09/12/2019 06:45   DG CHEST PORT 1 VIEW  Result Date: 09/09/2019 CLINICAL DATA:  Acute respiratory failure with hypoxia EXAM: PORTABLE CHEST 1 VIEW COMPARISON:  Radiograph 09/08/2019 FINDINGS: Endotracheal tube terminates in the mid trachea at the level of the aortic arch. Transesophageal tube tip and side port distal to the GE junction. Telemetry leads overlie the chest. Some patchy right basilar opacity appears slightly more confluent  than on comparison exam. Opacities in the left lung base are similar to slightly less and. No visible pneumothorax or effusion. The cardiomediastinal contours are unremarkable. No acute osseous or soft tissue abnormality. Degenerative changes are present in the imaged spine and shoulders. IMPRESSION: Slightly more confluent patchy right basilar opacity. Opacities in the left lung base are similar to slightly lessened. Support devices as above. Electronically Signed   By: Kreg Shropshire M.D.   On: 09/09/2019 06:03   DG Chest Port 1 View  Result Date: 09/08/2019 CLINICAL DATA:  Endotracheal tube placement and central line. EXAM: PORTABLE CHEST 1 VIEW COMPARISON:  Yesterday FINDINGS: Endotracheal tube tip is just above the clavicular heads, 7 cm above the carina. The enteric tube at least reaches the  stomach. Asymmetric elevation of the right diaphragm. There are indistinct opacities at the bases, especially on the right. No evidence of effusion or pneumothorax. IMPRESSION: 1. Higher endotracheal tube, tip 7 cm above the carina. 2. Worsened aeration at the right base with atelectatic type appearance. Electronically Signed   By: Marnee Spring M.D.   On: 09/08/2019 07:03   DG Chest Portable 1 View  Result Date: 09/07/2019 CLINICAL DATA:  Post intubation. EXAM: PORTABLE CHEST 1 VIEW COMPARISON:  Earlier radiographs today. FINDINGS: 1243 hours. Interval intubation. Tip of the endotracheal tube is in the mid trachea, 3.2 cm above the carina. There are lower lung volumes with increased patchy opacities at both lung bases which may reflect atelectasis. There is no confluent airspace opacity, edema, pleural effusion or pneumothorax. The heart size and mediastinal contours are stable. IMPRESSION: Satisfactory position of the endotracheal tube. Lower lung volumes with probable increased bibasilar atelectasis. Cannot exclude early aspiration. Electronically Signed   By: Carey Bullocks M.D.   On: 09/07/2019 13:17   DG Chest Port 1 View  Result Date: 09/07/2019 CLINICAL DATA:  Sepsis.  COVID-19 positive. EXAM: PORTABLE CHEST 1 VIEW COMPARISON:  None. FINDINGS: Midline trachea. Borderline cardiomegaly. No pleural effusion or pneumothorax. Interstitial prominence is lower lobe predominant, accentuated by low lung volumes and AP portable technique-nonspecific. No lobar consolidation. IMPRESSION: Mildly low lung volumes, without acute disease. Electronically Signed   By: Jeronimo Greaves M.D.   On: 09/07/2019 11:01      The results of significant diagnostics from this hospitalization (including imaging, microbiology, ancillary and laboratory) are listed below for reference.     Microbiology: Recent Results (from the past 240 hour(s))  Culture, blood (Routine X 2) w Reflex to ID Panel     Status: None    Collection Time: 09/12/19  5:13 PM   Specimen: BLOOD LEFT FOREARM  Result Value Ref Range Status   Specimen Description   Final    BLOOD LEFT FOREARM Performed at [facility], [address] [place], [place] [address]    Special Requests   Final    BOTTLES DRAWN AEROBIC ONLY Blood Culture adequate volume Performed at [facility], [address], [place], [place] [address]    Culture   Final    NO GROWTH 5 DAYS Performed at [facility], [address], [place], [place] [address]    Report Status 09/18/2019 FINAL  Final  Culture, Urine     Status: None   Collection Time: 09/12/19  5:14 PM   Specimen: Urine, Catheterized  Result Value Ref Range Status   Specimen Description   Final    URINE, CATHETERIZED Performed at [facility], [address]  [place], [place] [address]    Special Requests   Final    NONE Performed at [facility], [address], [place], [place] [address]    Culture   Final    NO GROWTH Performed at [facility], [address], [place], [place] [address]    Report Status 09/14/2019 FINAL  Final  Culture, respiratory (non-expectorated)     Status: None   Collection Time: 09/12/19  5:14 PM   Specimen: Tracheal Aspirate; Respiratory  Result Value Ref Range Status   Specimen Description   Final    TRACHEAL ASPIRATE Performed at [facility], [address] [place], [place] [address]    Special Requests   Final    NONE Performed at [facility], [address], [place], [place] [address]    Gram Stain   Final    MODERATE WBC PRESENT, PREDOMINANTLY PMN FEW GRAM NEGATIVE RODS FEW GRAM POSITIVE COCCI IN CLUSTERS RARE GRAM POSITIVE RODS Performed at [facility], [address], [place], [place] [address]    Culture   Final    FEW METHICILLIN RESISTANT STAPHYLOCOCCUS AUREUS FEW ENTEROCOCCUS FAECALIS    Report Status  09/17/2019 FINAL  Final   Organism ID, Bacteria METHICILLIN RESISTANT STAPHYLOCOCCUS AUREUS  Final   Organism ID, Bacteria ENTEROCOCCUS FAECALIS  Final      Susceptibility   Enterococcus faecalis - MIC*    AMPICILLIN <=2 SENSITIVE Sensitive     VANCOMYCIN 1 SENSITIVE Sensitive     GENTAMICIN SYNERGY SENSITIVE Sensitive     * FEW ENTEROCOCCUS FAECALIS   Methicillin resistant staphylococcus aureus - MIC*    CIPROFLOXACIN >=8 RESISTANT Resistant     ERYTHROMYCIN >=8 RESISTANT Resistant     GENTAMICIN <=0.5 SENSITIVE Sensitive     OXACILLIN >=4 RESISTANT Resistant     TETRACYCLINE <=1 SENSITIVE Sensitive     VANCOMYCIN 1 SENSITIVE Sensitive     TRIMETH/SULFA >=320 RESISTANT Resistant     CLINDAMYCIN <=0.25 SENSITIVE Sensitive     RIFAMPIN <=0.5 SENSITIVE Sensitive     Inducible Clindamycin NEGATIVE Sensitive     * FEW METHICILLIN RESISTANT STAPHYLOCOCCUS AUREUS  Culture, blood (Routine X 2) w Reflex to ID Panel     Status: None   Collection Time: 09/12/19  5:18 PM   Specimen: BLOOD  Result Value Ref Range Status   Specimen Description BLOOD RIGHT ANTECUBITAL  Final   Special Requests   Final    BOTTLES DRAWN AEROBIC ONLY Blood Culture adequate volume Performed at [facility], [address] [place], [place] [address]    Culture   Final    NO GROWTH 5 DAYS Performed at [facility], [address] [place], [place] [address]    Report Status 09/18/2019 FINAL  Final     Labs: BNP (last 3 results) Recent Labs    09/07/19 1950  BNP 37.6   Basic Metabolic Panel: Recent Labs  Lab 09/17/19 0350 09/18/19 0050 09/20/19 0610 09/21/19 0610 09/22/19 0600  NA 141 141 142 139 138  K 4.3 3.4* 4.0 4.4 4.6  CL 98 102 102 100 100  CO2 29 29 31 31 31   GLUCOSE 198* 177* 111* 159* 129*  BUN 38* 35* 23 25* 24*  CREATININE 0.61 0.63 0.51 0.58 0.55  CALCIUM 8.9 8.5* 8.8* 8.9 8.6*  MG  --   --  2.1 1.9 2.0   Liver Function Tests: Recent Labs  Lab  09/16/19  0157 09/17/19 0350 09/18/19 0050  AST 21 25 33  ALT 26 33 34  ALKPHOS 81 84 85  BILITOT 0.9 1.0 0.6  PROT 6.1* 6.4* 5.6*  ALBUMIN 2.2* 2.4* 2.1*   No results for input(s): LIPASE, AMYLASE in the last 168 hours. No results for input(s): AMMONIA in the last 168 hours. CBC: Recent Labs  Lab 09/16/19 0157  WBC 11.8*  NEUTROABS 8.9*  HGB 12.2  HCT 38.5  MCV 94.6  PLT 258   Cardiac Enzymes: No results for input(s): CKTOTAL, CKMB, CKMBINDEX, TROPONINI in the last 168 hours. BNP: Invalid input(s): POCBNP CBG: Recent Labs  Lab 09/21/19 1807 09/21/19 2055 09/22/19 0406 09/22/19 0754 09/22/19 1125  GLUCAP 168* 88 143* 133* 271*   D-Dimer No results for input(s): DDIMER in the last 72 hours. Hgb A1c No results for input(s): HGBA1C in the last 72 hours. Lipid Profile No results for input(s): CHOL, HDL, LDLCALC, TRIG, CHOLHDL, LDLDIRECT in the last 72 hours. Thyroid function studies No results for input(s): TSH, T4TOTAL, T3FREE, THYROIDAB in the last 72 hours.  Invalid input(s): FREET3 Anemia work up No results for input(s): VITAMINB12, FOLATE, FERRITIN, TIBC, IRON, RETICCTPCT in the last 72 hours. Urinalysis    Component Value Date/Time   COLORURINE AMBER (A) 09/07/2019 1119   APPEARANCEUR CLOUDY (A) 09/07/2019 1119   LABSPEC 1.024 09/07/2019 1119   PHURINE 5.0 09/07/2019 1119   GLUCOSEU 50 (A) 09/07/2019 1119   HGBUR SMALL (A) 09/07/2019 1119   BILIRUBINUR NEGATIVE 09/07/2019 1119   KETONESUR 5 (A) 09/07/2019 1119   PROTEINUR 100 (A) 09/07/2019 1119   NITRITE NEGATIVE 09/07/2019 1119   LEUKOCYTESUR NEGATIVE 09/07/2019 1119   Sepsis Labs Invalid input(s): PROCALCITONIN,  WBC,  LACTICIDVEN Microbiology Recent Results (from the past 240 hour(s))  Culture, blood (Routine X 2) w Reflex to ID Panel     Status: None   Collection Time: 09/12/19  5:13 PM   Specimen: BLOOD LEFT FOREARM  Result Value Ref Range Status   Specimen Description   Final    BLOOD  LEFT FOREARM Performed at [facility], [address] [place], [place] [address]    Special Requests   Final    BOTTLES DRAWN AEROBIC ONLY Blood Culture adequate volume Performed at [facility], [address] [place], [place] [id number]    Culture   Final    NO GROWTH 5 DAYS Performed at [facility], [address] [place], [place] [address]    Report Status 09/18/2019 FINAL  Final  Culture, Urine     Status: None   Collection Time: 09/12/19  5:14 PM   Specimen: Urine, Catheterized  Result Value Ref Range Status   Specimen Description   Final    URINE, CATHETERIZED Performed at [facility], [address] [place], [place] [address]    Special Requests   Final    NONE Performed at [facility], [address] [place], [place] [address]    Culture   Final    NO GROWTH Performed at [facility], [address] [place], [place] [address]    Report Status 09/14/2019 FINAL  Final  Culture, respiratory (non-expectorated)     Status: None   Collection Time: 09/12/19  5:14 PM   Specimen: Tracheal Aspirate; Respiratory  Result Value Ref Range Status   Specimen Description   Final    TRACHEAL ASPIRATE Performed at [facility], [address]  [place], [place] [address]    Special Requests   Final    NONE Performed at [facility], [address], [place], [place] [address]    Gram Stain   Final    MODERATE WBC PRESENT, PREDOMINANTLY PMN FEW GRAM NEGATIVE RODS FEW GRAM POSITIVE COCCI IN CLUSTERS RARE GRAM POSITIVE RODS Performed at [facility], [address] [place], [place] [address]    Culture   Final    FEW METHICILLIN RESISTANT STAPHYLOCOCCUS AUREUS FEW ENTEROCOCCUS FAECALIS    Report Status 09/17/2019 FINAL  Final   Organism ID, Bacteria METHICILLIN RESISTANT STAPHYLOCOCCUS AUREUS  Final   Organism ID, Bacteria ENTEROCOCCUS  FAECALIS  Final      Susceptibility   Enterococcus faecalis - MIC*    AMPICILLIN <=2 SENSITIVE Sensitive     VANCOMYCIN 1 SENSITIVE Sensitive     GENTAMICIN SYNERGY SENSITIVE Sensitive     * FEW ENTEROCOCCUS FAECALIS   Methicillin resistant staphylococcus aureus - MIC*    CIPROFLOXACIN >=8 RESISTANT Resistant     ERYTHROMYCIN >=8 RESISTANT Resistant     GENTAMICIN <=0.5 SENSITIVE Sensitive     OXACILLIN >=4 RESISTANT Resistant     TETRACYCLINE <=1 SENSITIVE Sensitive     VANCOMYCIN 1 SENSITIVE Sensitive     TRIMETH/SULFA >=320 RESISTANT Resistant     CLINDAMYCIN <=0.25 SENSITIVE Sensitive     RIFAMPIN <=0.5 SENSITIVE Sensitive     Inducible Clindamycin NEGATIVE Sensitive     * FEW METHICILLIN RESISTANT STAPHYLOCOCCUS AUREUS  Culture, blood (Routine X 2) w Reflex to ID Panel     Status: None   Collection Time: 09/12/19  5:18 PM   Specimen: BLOOD  Result Value Ref Range Status   Specimen Description BLOOD RIGHT ANTECUBITAL  Final   Special Requests   Final    BOTTLES DRAWN AEROBIC ONLY Blood Culture adequate volume Performed at [facility], [address] [place], [place] [address]    Culture   Final    NO GROWTH 5 DAYS Performed at [facility], [address] [place], [place] [address]    Report Status 09/18/2019 FINAL  Final     Time coordinating discharge:  I have spent 35 minutes face to face with the patient and on the ward discussing the patients care, assessment, plan and disposition with other care givers. >50% of the time was devoted counseling the patient about the risks and benefits of treatment/Discharge disposition and coordinating care.   SIGNED:   Dimple Nanas, MD  [facility] 09/22/2019, 12:02 PM   If 7PM-7AM, please contact night-coverage

## 2019-09-22 NOTE — Consult Note (Signed)
WOC Nurse wound follow up Patient receiving care in [id number].  Follow up wound eval completed remotely after review of images from today. Wound type: unstageable to right buttock, coccyx Measurement: To be provided by the bedside RN in the flowsheet section Wound bed: Large area of black eschar, some yellow slough, some pink in the wound bed Drainage (amount, consistency, odor)  Periwound: intact Dressing procedure/placement/frequency: Santyl discontinued. BID dressings as follows: Place saline moistened gauze to the right buttock and coccyx wound. Top with ABD pads. Tape in place. Helmut Muster, RN, MSN, CWOCN, CNS-BC, pager [phone]

## 2019-09-22 NOTE — Discharge Instructions (Addendum)
Dressing procedure/placement/frequency for her Sacral area: Santyl discontinued. BID dressings as follows: Place saline moistened gauze to the right buttock and coccyx wound. Top with ABD pads. Tape in place.  You were cared for by a hospitalist during your hospital stay. If you have any questions about your discharge medications or the care you received while you were in the hospital after you are discharged, you can call the unit and asked to speak with the hospitalist on call if the hospitalist that took care of you is not available. Once you are discharged, your primary care physician will handle any further medical issues. Please note that NO REFILLS for any discharge medications will be authorized once you are discharged, as it is imperative that you return to your primary care physician (or establish a relationship with a primary care physician if you do not have one) for your aftercare needs so that they can reassess your need for medications and monitor your lab values.  Please request your Prim.MD to go over all Hospital Tests and Procedure/Radiological results at the follow up, please get all Hospital records sent to your Prim MD by signing hospital release before you go home.  Get CBC, CMP, 2 view Chest X ray checked  by Primary MD during your next visit or SNF MD in 5-7 days ( we routinely change or add medications that can affect your baseline labs and fluid status, therefore we recommend that you get the mentioned basic workup next visit with your PCP, your PCP may decide not to get them or add new tests based on their clinical decision)  On your next visit with your primary care physician please Get Medicines reviewed and adjusted.  If you experience worsening of your admission symptoms, develop shortness of breath, life threatening emergency, suicidal or homicidal thoughts you must seek medical attention immediately by calling 911 or calling your MD immediately  if symptoms less  severe.  You Must read complete instructions/literature along with all the possible adverse reactions/side effects for all the Medicines you take and that have been prescribed to you. Take any new Medicines after you have completely understood and accpet all the possible adverse reactions/side effects.   Do not drive, operate heavy machinery, perform activities at heights, swimming or participation in water activities or provide baby sitting services if your were admitted for syncope or siezures until you have seen by Primary MD or a Neurologist and advised to do so again.  Do not drive when taking Pain medications.

## 2019-09-23 NOTE — Progress Notes (Signed)
Becky Hendricks from [facility] here to transport patient to [facility]. Pt's belongings with patient. VS stable.

## 2019-10-12 ENCOUNTER — Encounter (HOSPITAL_BASED_OUTPATIENT_CLINIC_OR_DEPARTMENT_OTHER): Payer: Medicare Other | Attending: Internal Medicine | Admitting: Internal Medicine

## 2019-10-12 ENCOUNTER — Other Ambulatory Visit (HOSPITAL_COMMUNITY)
Admission: RE | Admit: 2019-10-12 | Discharge: 2019-10-12 | Disposition: A | Discharge status: Home / Self Care | Payer: Medicare Other | Source: Other Acute Inpatient Hospital | Attending: Internal Medicine | Admitting: Internal Medicine

## 2019-10-12 DIAGNOSIS — Z79899 Other long term (current) drug therapy: Secondary | ICD-10-CM | POA: Insufficient documentation

## 2019-10-12 DIAGNOSIS — L89314 Pressure ulcer of right buttock, stage 4: Secondary | ICD-10-CM | POA: Insufficient documentation

## 2019-10-12 DIAGNOSIS — Z8616 Personal history of COVID-19: Secondary | ICD-10-CM | POA: Insufficient documentation

## 2019-10-12 DIAGNOSIS — Z7901 Long term (current) use of anticoagulants: Secondary | ICD-10-CM | POA: Insufficient documentation

## 2019-10-12 DIAGNOSIS — I10 Essential (primary) hypertension: Secondary | ICD-10-CM | POA: Diagnosis not present

## 2019-10-12 DIAGNOSIS — I4819 Other persistent atrial fibrillation: Secondary | ICD-10-CM | POA: Diagnosis not present

## 2019-10-12 DIAGNOSIS — E119 Type 2 diabetes mellitus without complications: Secondary | ICD-10-CM | POA: Diagnosis not present

## 2019-10-12 DIAGNOSIS — F319 Bipolar disorder, unspecified: Secondary | ICD-10-CM | POA: Insufficient documentation

## 2019-10-12 DIAGNOSIS — E440 Moderate protein-calorie malnutrition: Secondary | ICD-10-CM | POA: Diagnosis present

## 2019-10-13 NOTE — Progress Notes (Signed)
Becky Hendricks, Becky Hendricks ([id number]) Visit Report for 10/12/2019 Chief Complaint Document Details Patient Name: Date of Service: Becky Hendricks, Becky Hendricks 10/12/2019 9:00 AM Medical Record [id number] Patient Account Number: [id number] Date of Birth/Sex: Treating RN: September 24, 1945 (74 y.o. 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 Becky Hendricks Primary Care Provider: SYSTEM, PCP Other Clinician: Referring Provider: Treating Provider/Extender:Becky Hendricks, Becky Hendricks in Treatment: 0 Debridement Performed for Wound #1 Right Gluteus Assessment: Performed By: Physician Becky Hendricks., MD Debridement Type: Debridement Level of Consciousness (Pre- Awake and Alert procedure): Pre-procedure Verification/Time Out Taken: Yes - 10:15 Start Time: 10:15 Total Area Debrided (L x W): 5 (cm) x 4 (cm) = 20 (cm) Tissue and other material Non-Viable, Eschar, Muscle, Slough, Slough debrided: Level: Skin/Subcutaneous Tissue/Muscle Debridement Description: Excisional Instrument: Blade, Forceps Specimen: Swab, Number of Specimens Taken: 1 Bleeding: Moderate Hemostasis Achieved: Silver Nitrate End Time: 10:20 Procedural Pain: 2 Post Procedural Pain: 0 Response to Treatment: Procedure was tolerated well Level of  Consciousness Awake and Alert (Post-procedure): Post Debridement Measurements of Total Wound Length: (cm) 9.2 Stage: Category/Stage IV Width: (cm) 7 Depth: (cm) 8.5 Volume: (cm) 429.927 Character of Wound/Ulcer Post Requires Further Debridement Debridement: Post Procedure Diagnosis Same as Pre-procedure Electronic Signature(s) Signed: 10/12/2019 5:45:55 PM By: Becky Ham MD Signed: 10/12/2019 5:57:14 PM By: Becky Hurst RN, BSN Entered By: Becky Hendricks on 10/12/2019 10:38:43 -------------------------------------------------------------------------------- HPI Details Patient Name: Date of Service: Becky Hendricks, Becky Hendricks 10/12/2019 9:00 AM Medical Record [id number] Patient Account Number: [id number] Date of Birth/Sex: Treating RN: 09/16/1945 (74 y.o. Becky Hendricks Primary Care Provider: SYSTEM, PCP Other Clinician: Referring Provider: Treating Provider/Extender:Becky Hendricks, Becky Hendricks in Treatment: 0 History of Present Illness HPI Description: ADMISSION 10/12/2019 This is a 74 year old woman whose problem began sometime in December 2020 when she was found on the floor in her condo in [place]. She was admitted to [facility] apparently dehydrated with some form of "intestinal infection". She was discharged to [facility] in [place] and sometime over the next several weeks that she was there she developed pressure ulcers on her buttock. Her brother who accompanies her today says that he was not aware of this still the patient was admitted to [facility] for Covid pneumonia from 1/25 through 2/9. At that point the treatment was Santyl and then wet-to-dry dressings. Also notable on 2/5 she had an albumin of 2.5 and a C-reactive protein of 1.7. The patient is now at [facility] skilled facility. I am not exactly sure of her functional status although her brother said she can stand and pivot transfer to a wheelchair but cannot  walk. Past medical history; atrial fibrillation on Eliquis, bipolar affective disorder, recent diagnosis of type 2 diabetes with a hemoglobin A1c of 9.5 pseudomembranous colitis and hypertension. Currently the patient has a very large stage IV wound in close proximity to the gluteal cleft. Copious necrotic material and odor. She has a smaller superficial stage III wound on the left buttock. I am unclear what they are using at the nursing  home currently Electronic Signature(s) Signed: 10/12/2019 5:45:55 PM By: Becky Hendricks, Becky Droll MD Entered By: Becky Hendricks, Becky Hendricks on 10/12/2019 10:42:37 -------------------------------------------------------------------------------- Physical Exam Details Patient Name: Date of Service: Becky Hendricks, Becky Hendricks 10/12/2019 9:00 AM Medical Record [id number] Patient Account Number: [id number] Date of Birth/Sex: Treating RN: 02/23/1946 (74 y.o. Becky LinkF) Hendricks, Becky Primary Care Provider: SYSTEM, PCP Other Clinician: Referring Provider: Treating Provider/Extender:Shaunte Weissinger, Lamar SprinklesMichael Weeks in Treatment: 0 Constitutional Patient is hypertensive.. Pulse regular and within target range for patient.Marland Kitchen. Respirations regular, non-labored and within target range.. Temperature is normal and within the target range for the patient.Marland Kitchen. Appears in no distress. Eyes Conjunctivae clear. No discharge.no icterus. Respiratory work of breathing is normal. Cardiovascular Heart rhythm and rate regular, without murmur or gallop. She does not appear to be dehydrated.. Gastrointestinal (GI) Abdomen is soft nontender bowel sounds are positive. No liver or spleen enlargement. Genitourinary (GU) Bladder is not distended. Integumentary (Hair, Skin) She has what appears to be candidal skin rash around the wounds but no systemic issue.Marland Kitchen. Psychiatric appears at normal baseline. Somewhat anxious. Notes Wound exam; this patient has not absolutely horrible large stage IV wound over her right upper buttock. This  probes to bone which may be her pelvic rim or lower coccyx. Copious amounts of necrotic malodorous liquefied material which I think is probably subcutaneous and muscle. Using a #10 scalpel and pickups I remove this much of this is possible a culture was done after this. There is no current evidence of soft tissue spreading infection no erythema no crepitus. On the left side he has 2 more superficial wound but nevertheless is full-thickness stage III wound. No debridement was required Electronic Signature(s) Signed: 10/12/2019 5:45:55 PM By: Becky Hendricks, Jakita Dutkiewicz MD Entered By: Becky Hendricks, Takai Chiaramonte on 10/12/2019 10:44:53 -------------------------------------------------------------------------------- Physician Orders Details Patient Name: Date of Service: Becky Hendricks, Tajia 10/12/2019 9:00 AM Medical Record [id number] Patient Account Number: [id number] Date of Birth/Sex: Treating RN: 05/31/1946 (74 y.o. Becky LinkF) Hendricks, Becky Primary Care Provider: SYSTEM, PCP Other Clinician: Referring Provider: Treating Provider/Extender:Arleen Bar, Lamar SprinklesMichael Weeks in Treatment: 0 Verbal / Phone Orders: No Diagnosis Coding Follow-up Appointments Return Appointment in 2 weeks. - ***[facility] - ROOM 5*** Dressing Change Frequency Wound #1 Right Gluteus Change dressing every day. Wound #2 Left Gluteus Change dressing every day. Skin Barriers/Peri-Wound Care Barrier cream Wound Cleansing Wound #1 Right Gluteus Clean wound with Wound Cleanser - or normal saline Wound #2 Left Gluteus Clean wound with Wound Cleanser - or normal saline Primary Wound Dressing Wound #1 Right Gluteus Calcium Alginate with Silver - fill space with gauze/kerlix Wound #2 Left Gluteus Calcium Alginate with Silver Secondary Dressing Wound #1 Right Gluteus ABD pad - secure with tape Wound #2 Left Gluteus ABD pad - secure with tape Off-Loading Low air-loss mattress (Group 2) Turn and reposition every 2 hours Laboratory Bacteria identified in  Unspecified specimen by Anaerobe culture (MICRO) - Right gluteus LOINC Code: 635-3 Convenience Name: Anerobic culture Radiology X-ray, pelvis - Non healing stage 4 pressure ulcer on right gluteus Electronic Signature(s) Signed: 10/12/2019 5:45:55 PM By: Becky Hendricks, Kamee Bobst MD Signed: 10/12/2019 5:57:14 PM By: Zandra AbtsLynch, [name] RN, BSN Entered By: Zandra AbtsLynch, Becky on 10/12/2019 10:30:12 -------------------------------------------------------------------------------- Prescription 10/12/2019 Patient Name: Becky SersMEYKO, Sally-Ann Provider: Baltazar Hendricks, Zamere Pasternak MD Date of Birth: 11/17/1945 NPI#: [id number] Sex: F DEA#: [id number] Phone #: [phone] License #: [id number] [facility] [address] [address] [place] [id number] [place] [address] [phone] Allergies No Known Allergies Provider's Orders X-ray, pelvis - Non  healing stage 4 pressure ulcer on right gluteus Signature(s): Date(s): Electronic Signature(s) Signed: 10/12/2019 5:45:55 PM By: Becky Najjar MD Signed: 10/12/2019 5:57:14 PM By: Zandra Abts RN, BSN Entered By: Zandra Abts on 10/12/2019 10:30:12 --------------------------------------------------------------------------------  Problem List Details Patient Name: Date of Service: Becky Hendricks, Becky Hendricks 10/12/2019 9:00 AM Medical Record [id number] Patient Account Number: [id number] Date of Birth/Sex: Treating RN: 1946/03/14 (74 y.o. Becky Hendricks Primary Care Provider: SYSTEM, PCP Other Clinician: Referring Provider: Treating Provider/Extender:Shimshon Narula, Lamar Sprinkles in Treatment: 0 Active Problems ICD-10 Evaluated Encounter Code Description Active Date Today Diagnosis L89.314 Pressure ulcer of right buttock, stage 4 10/12/2019 No Yes L89.323 Pressure ulcer of left buttock, stage 3 10/12/2019 No Yes E44.0 Moderate protein-calorie malnutrition 10/12/2019 No Yes Inactive Problems Resolved  Problems Electronic Signature(s) Signed: 10/12/2019 5:45:55 PM By: Becky Najjar MD Entered By: Becky Najjar on 10/12/2019 10:37:08 -------------------------------------------------------------------------------- Progress Note Details Patient Name: Date of Service: Becky Hendricks, Becky Hendricks 10/12/2019 9:00 AM Medical Record [id number] Patient Account Number: [id number] Date of Birth/Sex: Treating RN: June 12, 1946 (74 y.o. Becky Hendricks Primary Care Provider: SYSTEM, PCP Other Clinician: Referring Provider: Treating Provider/Extender:Kimisha Eunice, Lamar Sprinkles in Treatment: 0 Subjective Chief Complaint Information obtained from Patient 10/12/2019; patient is here for review of a substantial right buttock stage IV pressure ulcer and a smaller area stage III History of Present Illness (HPI) ADMISSION 10/12/2019 This is a 74 year old woman whose problem began sometime in December 2020 when she was found on the floor in her condo in [place]. She was admitted to [facility] apparently dehydrated with some form of "intestinal infection". She was discharged to [facility] healthcare in [place] and sometime over the next several weeks that she was there she developed pressure ulcers on her buttock. Her brother who accompanies her today says that he was not aware of this still the patient was admitted to [facility] for Covid pneumonia from 1/25 through 2/9. At that point the treatment was Santyl and then wet-to-dry dressings. Also notable on 2/5 she had an albumin of 2.5 and a C-reactive protein of 1.7. The patient is now at [facility] skilled facility. I am not exactly sure of her functional status although her brother said she can stand and pivot transfer to a wheelchair but cannot walk. Past medical history; atrial fibrillation on Eliquis, bipolar affective disorder, recent diagnosis of type 2 diabetes with a hemoglobin A1c of 9.5 pseudomembranous colitis and  hypertension. Currently the patient has a very large stage IV wound in close proximity to the gluteal cleft. Copious necrotic material and odor. She has a smaller superficial stage III wound on the left buttock. I am unclear what they are using at the nursing home currently Patient History Information obtained from Patient. Allergies No Known Allergies Family History Heart Disease - Father, Stroke - Father, No family history of Cancer, Diabetes, Hereditary Spherocytosis, Hypertension, Kidney Disease, Lung Disease, Seizures, Thyroid Problems, Tuberculosis. Social History Never smoker, Marital Status - Single, Alcohol Use - Never, Drug Use - No History, Caffeine Use - Moderate. Medical History Eyes Denies history of Cataracts, Glaucoma, Optic Neuritis Ear/Nose/Mouth/Throat Denies history of Chronic sinus problems/congestion, Middle ear problems Hematologic/Lymphatic Denies history of Anemia, Hemophilia, Human Immunodeficiency Virus, Lymphedema, Sickle Cell Disease Respiratory Denies history of Aspiration, Asthma, Chronic Obstructive Pulmonary Disease (COPD), Pneumothorax, Sleep Apnea, Tuberculosis Cardiovascular Patient has history of Hypertension Denies history of Angina, Arrhythmia, Congestive Heart Failure, Coronary Artery Disease, Deep Vein Thrombosis, Hypotension, Myocardial Infarction, Peripheral Arterial Disease, Peripheral Venous Disease, Phlebitis, Vasculitis Gastrointestinal  Denies history of Cirrhosis , Colitis, Crohnoos, Hepatitis A, Hepatitis B, Hepatitis C Endocrine Patient has history of Type II Diabetes Denies history of Type I Diabetes Genitourinary Denies history of End Stage Renal Disease Immunological Denies history of Lupus Erythematosus, Raynaudoos, Scleroderma Integumentary (Skin) Denies history of History of Burn Musculoskeletal Denies history of Gout, Rheumatoid Arthritis, Osteoarthritis, Osteomyelitis Neurologic Denies history of Dementia,  Neuropathy, Quadriplegia, Paraplegia, Seizure Disorder Oncologic Denies history of Received Chemotherapy, Received Radiation Psychiatric Denies history of Anorexia/bulimia, Confinement Anxiety Patient is treated with Insulin, Oral Agents. Review of Systems (ROS) Constitutional Symptoms (General Health) Denies complaints or symptoms of Fatigue, Fever, Chills, Marked Weight Change. Eyes Complains or has symptoms of Glasses / Contacts. Denies complaints or symptoms of Dry Eyes, Vision Changes. Ear/Nose/Mouth/Throat Denies complaints or symptoms of Chronic sinus problems or rhinitis. Respiratory Denies complaints or symptoms of Chronic or frequent coughs, Shortness of Breath. Cardiovascular Denies complaints or symptoms of Chest pain. Gastrointestinal Denies complaints or symptoms of Frequent diarrhea, Nausea, Vomiting. Endocrine Denies complaints or symptoms of Heat/cold intolerance. Genitourinary Denies complaints or symptoms of Frequent urination. Integumentary (Skin) Complains or has symptoms of Wounds. Musculoskeletal Denies complaints or symptoms of Muscle Pain, Muscle Weakness. Neurologic Denies complaints or symptoms of Numbness/parasthesias. Psychiatric Denies complaints or symptoms of Claustrophobia, Suicidal. Objective Constitutional Patient is hypertensive.. Pulse regular and within target range for patient.Marland Kitchen. Respirations regular, non-labored and within target range.. Temperature is normal and within the target range for the patient.Marland Kitchen. Appears in no distress. Vitals Time Taken: 9:18 AM, Height: 66 in, Source: Stated, Weight: 224 lbs, Source: Stated, BMI: 36.2, Temperature: 98.2 F, Pulse: 102 bpm, Respiratory Rate: 20 breaths/min, Blood Pressure: 147/87 mmHg. Eyes Conjunctivae clear. No discharge.no icterus. Respiratory work of breathing is normal. Cardiovascular Heart rhythm and rate regular, without murmur or gallop. She does not appear to be  dehydrated.. Gastrointestinal (GI) Abdomen is soft nontender bowel sounds are positive. No liver or spleen enlargement. Genitourinary (GU) Bladder is not distended. Psychiatric appears at normal baseline. Somewhat anxious. General Notes: Wound exam; this patient has not absolutely horrible large stage IV wound over her right upper buttock. This probes to bone which may be her pelvic rim or lower coccyx. Copious amounts of necrotic malodorous liquefied material which I think is probably subcutaneous and muscle. Using a #10 scalpel and pickups I remove this much of this is possible a culture was done after this. There is no current evidence of soft tissue spreading infection no erythema no crepitus. ooOn the left side he has 2 more superficial wound but nevertheless is full-thickness stage III wound. No debridement was required Integumentary (Hair, Skin) She has what appears to be candidal skin rash around the wounds but no systemic issue.. Wound #1 status is Open. Original cause of wound was Gradually Appeared. The wound is located on the Right Gluteus. The wound measures 9.2cm length x 7cm width x 8.5cm depth; 50.58cm^2 area and 429.927cm^3 volume. There is bone, muscle, and Fat Layer (Subcutaneous Tissue) Exposed exposed. There is no tunneling or undermining noted. There is a medium amount of serosanguineous drainage noted. There is medium (34-66%) red, pink granulation within the wound bed. There is a medium (34-66%) amount of necrotic tissue within the wound bed including Eschar. Wound #2 status is Open. Original cause of wound was Gradually Appeared. The wound is located on the Left Gluteus. The wound measures 3.5cm length x 0.7cm width x 0.1cm depth; 1.924cm^2 area and 0.192cm^3 volume. There is no tunneling or undermining noted. There is  a medium amount of serosanguineous drainage noted. The wound margin is flat and intact. There is small (1-33%) pink granulation within the wound bed.  There is a large (67- 100%) amount of necrotic tissue within the wound bed including Adherent Slough. Assessment Active Problems ICD-10 Pressure ulcer of right buttock, stage 4 Pressure ulcer of left buttock, stage 3 Moderate protein-calorie malnutrition Procedures Wound #1 Pre-procedure diagnosis of Wound #1 is a Pressure Ulcer located on the Right Gluteus . There was a Excisional Skin/Subcutaneous Tissue/Muscle Debridement with a total area of 20 sq cm performed by Maxwell Caul., MD. With the following instrument(s): Blade, and Forceps to remove Non-Viable tissue/material. Material removed includes Muscle, Eschar, and Slough. 1 specimen was taken by a Swab and sent to the lab per facility protocol. A time out was conducted at 10:15, prior to the start of the procedure. A Moderate amount of bleeding was controlled with Silver Nitrate. The procedure was tolerated well with a pain level of 2 throughout and a pain level of 0 following the procedure. Post Debridement Measurements: 9.2cm length x 7cm width x 8.5cm depth; 429.927cm^3 volume. Post debridement Stage noted as Category/Stage IV. Character of Wound/Ulcer Post Debridement requires further debridement. Post procedure Diagnosis Wound #1: Same as Pre-Procedure Plan Follow-up Appointments: Return Appointment in 2 weeks. - ***[facility] - ROOM 5*** Dressing Change Frequency: Wound #1 Right Gluteus: Change dressing every day. Wound #2 Left Gluteus: Change dressing every day. Skin Barriers/Peri-Wound Care: Barrier cream Wound Cleansing: Wound #1 Right Gluteus: Clean wound with Wound Cleanser - or normal saline Wound #2 Left Gluteus: Clean wound with Wound Cleanser - or normal saline Primary Wound Dressing: Wound #1 Right Gluteus: Calcium Alginate with Silver - fill space with gauze/kerlix Wound #2 Left Gluteus: Calcium Alginate with Silver Secondary Dressing: Wound #1 Right Gluteus: ABD pad - secure with tape Wound #2 Left  Gluteus: ABD pad - secure with tape Off-Loading: Low air-loss mattress (Group 2) Turn and reposition every 2 hours Radiology ordered were: X-ray, pelvis - Non healing stage 4 pressure ulcer on right gluteus Laboratory ordered were: Anerobic culture - Right gluteus 1. The area on the right buttock is deep to bone grossly necrotic and likely infected. 2. After an extensive debridement involving muscle and other unidentified tissue this clearly probes to bone. 3. The specimen for CandS was done it would be possible to get bone specimen for culture but I did not do this today 4. Primary dressing silver alginate backing dry gauze. This will need to be changed daily 5. I cannot imagine how anything short of a skilled facility could manage this wound. This could not be managed as an outpatient unless the patient had intensive in-home support. He does not appear to have this 6. The area on the left buttock is stage III by definition but more superficial we will use silver alginate on this as well 7. She will need a x-ray of the pelvis specifically looking at the underlying pelvic bone on the right. Likely repeat nutritional parameters including a CT CMP CBC. 8. She may require an MRI of this area. I spent 45 minutes at least on this area including review of records, examination of the patient, discussion with the patient and her brother and preparation of this record Electronic Signature(s) Signed: 10/12/2019 5:45:55 PM By: Becky Najjar MD Entered By: Becky Najjar on 10/12/2019 10:47:51 -------------------------------------------------------------------------------- HxROS Details Patient Name: Date of Service: Becky Hendricks, Becky Hendricks 10/12/2019 9:00 AM Medical Record [id number] Patient Account Number: [id number] Date of  Birth/Sex: Treating RN: 16-Feb-1946 (74 y.o. Becky Hendricks Primary Care Provider: SYSTEM, PCP Other Clinician: Referring Provider: Treating Provider/Extender:Oswin Griffith,  Lamar Sprinkles in Treatment: 0 Information Obtained From Patient Constitutional Symptoms (General Health) Complaints and Symptoms: Negative for: Fatigue; Fever; Chills; Marked Weight Change Eyes Complaints and Symptoms: Positive for: Glasses / Contacts Negative for: Dry Eyes; Vision Changes Medical History: Negative for: Cataracts; Glaucoma; Optic Neuritis Ear/Nose/Mouth/Throat Complaints and Symptoms: Negative for: Chronic sinus problems or rhinitis Medical History: Negative for: Chronic sinus problems/congestion; Middle ear problems Respiratory Complaints and Symptoms: Negative for: Chronic or frequent coughs; Shortness of Breath Medical History: Negative for: Aspiration; Asthma; Chronic Obstructive Pulmonary Disease (COPD); Pneumothorax; Sleep Apnea; Tuberculosis Cardiovascular Complaints and Symptoms: Negative for: Chest pain Medical History: Positive for: Hypertension Negative for: Angina; Arrhythmia; Congestive Heart Failure; Coronary Artery Disease; Deep Vein Thrombosis; Hypotension; Myocardial Infarction; Peripheral Arterial Disease; Peripheral Venous Disease; Phlebitis; Vasculitis Gastrointestinal Complaints and Symptoms: Negative for: Frequent diarrhea; Nausea; Vomiting Medical History: Negative for: Cirrhosis ; Colitis; Crohns; Hepatitis A; Hepatitis B; Hepatitis C Endocrine Complaints and Symptoms: Negative for: Heat/cold intolerance Medical History: Positive for: Type II Diabetes Negative for: Type I Diabetes Time with diabetes: 1 year Treated with: Insulin, Oral agents Genitourinary Complaints and Symptoms: Negative for: Frequent urination Medical History: Negative for: End Stage Renal Disease Integumentary (Skin) Complaints and Symptoms: Positive for: Wounds Medical History: Negative for: History of Burn Musculoskeletal Complaints and Symptoms: Negative for: Muscle Pain; Muscle Weakness Medical History: Negative for: Gout; Rheumatoid Arthritis;  Osteoarthritis; Osteomyelitis Neurologic Complaints and Symptoms: Negative for: Numbness/parasthesias Medical History: Negative for: Dementia; Neuropathy; Quadriplegia; Paraplegia; Seizure Disorder Psychiatric Complaints and Symptoms: Negative for: Claustrophobia; Suicidal Medical History: Negative for: Anorexia/bulimia; Confinement Anxiety Hematologic/Lymphatic Medical History: Negative for: Anemia; Hemophilia; Human Immunodeficiency Virus; Lymphedema; Sickle Cell Disease Immunological Medical History: Negative for: Lupus Erythematosus; Raynauds; Scleroderma Oncologic Medical History: Negative for: Received Chemotherapy; Received Radiation Immunizations Pneumococcal Vaccine: Received Pneumococcal Vaccination: No Implantable Devices None Family and Social History Cancer: No; Diabetes: No; Heart Disease: Yes - Father; Hereditary Spherocytosis: No; Hypertension: No; Kidney Disease: No; Lung Disease: No; Seizures: No; Stroke: Yes - Father; Thyroid Problems: No; Tuberculosis: No; Never smoker; Marital Status - Single; Alcohol Use: Never; Drug Use: No History; Caffeine Use: Moderate; Financial Concerns: No; Food, Clothing or Shelter Needs: No; Support System Lacking: No; Transportation Concerns: No Electronic Signature(s) Signed: 10/12/2019 5:45:55 PM By: Becky Najjar MD Signed: 10/13/2019 9:14:02 AM By: Yevonne Pax RN Entered By: Yevonne Pax on 10/12/2019 09:24:49 -------------------------------------------------------------------------------- SuperBill Details Patient Name: Date of Service: Becky Hendricks, Becky Hendricks 10/12/2019 Medical Record [id number] Patient Account Number: [id number] Date of Birth/Sex: Treating RN: January 22, 1946 (74 y.o. 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, stage 3 E44.0 Moderate protein-calorie malnutrition Facility Procedures CPT4 Code: [id number] Description: 99213 - WOUND CARE VISIT-LEV 3 EST PT Modifier: 25 Quantity: 1 Physician Procedures CPT4 Code Description: 7782423 99204 - WC PHYS LEVEL 4 - NEW PT ICD-10 Diagnosis Description L89.314 Pressure ulcer of right buttock, stage 4 L89.323 Pressure ulcer of left buttock, stage 3 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

## 2019-10-13 NOTE — Progress Notes (Addendum)
Becky Hendricks, Becky Hendricks ([id number]) Visit Report for 10/12/2019 Allergy List Details Patient Name: Date of Service: Becky Hendricks, Becky Hendricks 10/12/2019 9:00 AM Medical Record [id number] Patient Account Number: [id number] Date of Birth/Sex: Treating RN: 02/15/1946 (74 y.o. [name] Primary Care Nakai Yard: SYSTEM, PCP Other Clinician: Referring Brenisha Tsui: Treating Franziska Podgurski/Extender:Robson, Lamar Sprinkles in Treatment: 0 Allergies Active Allergies No Known Allergies Allergy Notes Electronic Signature(s) Signed: 10/13/2019 9:14:02 AM By: Yevonne Pax RN Entered By: Yevonne Pax on 10/12/2019 09:19:02 -------------------------------------------------------------------------------- Arrival Information Details Patient Name: Date of Service: Becky Hendricks, Becky Hendricks 10/12/2019 9:00 AM Medical Record [id number] Patient Account Number: [id number] Date of Birth/Sex: Treating RN: August 23, 1945 (74 y.o. [name] Primary Care Avice Funchess: SYSTEM, PCP Other Clinician: Referring Kenlie Seki: Treating Duell Holdren/Extender:Robson, Lamar Sprinkles in Treatment: 0 Visit Information Patient Arrived: Wheel Chair Arrival Time: 09:17 Accompanied By: brother Transfer Assistance: Nurse, adult Patient Identification Verified: Yes Secondary Verification Process Yes Completed: Patient Has Alerts: Yes Patient Alerts: Patient on Blood Thinner Electronic Signature(s) Signed: 10/13/2019 9:14:02 AM By: Yevonne Pax RN Entered By: Yevonne Pax on 10/12/2019 09:17:58 -------------------------------------------------------------------------------- Clinic Level of Care Assessment Details Patient Name: Date of Service: Becky Hendricks, Becky Hendricks 10/12/2019 9:00 AM Medical Record [id number] Patient Account Number: [id number] Date of Birth/Sex: Treating RN: 01/04/1946 (74 y.o. Wynelle Link Primary Care Tashiba Timoney: SYSTEM, PCP Other Clinician: Referring Janisse Ghan: Treating Joffrey Kerce/Extender:Robson, Lamar Sprinkles in Treatment: 0 Clinic  Level of Care Assessment Items TOOL 1 Quantity Score X - Use when EandM and Procedure is performed on INITIAL visit 1 0 ASSESSMENTS - Nursing Assessment / Reassessment X - General Physical Exam (combine w/ comprehensive assessment (listed just below) 1 20 when performed on new pt. evals) X - Comprehensive Assessment (HX, ROS, Risk Assessments, Wounds Hx, etc.) 1 25 ASSESSMENTS - Wound and Skin Assessment / Reassessment []  - Dermatologic / Skin Assessment (not related to wound area) 0 ASSESSMENTS - Ostomy and/or Continence Assessment and Care []  - Incontinence Assessment and Management 0 []  - Ostomy Care Assessment and Management (repouching, etc.) 0 PROCESS - Coordination of Care []  - Simple Patient / Family Education for ongoing care 0 X - Complex (extensive) Patient / Family Education for ongoing care 1 20 X - Staff obtains , Records, Test Results / Process Orders 1 10 X - Staff telephones HHA, Nursing Homes / Clarify orders / etc 1 10 []  - Routine Transfer to another Facility (non-emergent condition) 0 []  - Routine Hospital Admission (non-emergent condition) 0 X - New Admissions / / Ordering NPWT, Apligraf, etc. 1 15 []  - Emergency Hospital Admission (emergent condition) 0 PROCESS - Special Needs []  - Pediatric / Minor Patient Management 0 []  - Isolation Patient Management 0 []  - Hearing / Language / Visual special needs 0 []  - Assessment of Community assistance (transportation, D/C planning, etc.) 0 []  - Additional assistance / Altered mentation 0 []  - Support Surface(s) Assessment (bed, cushion, seat, etc.) 0 INTERVENTIONS - Miscellaneous []  - External ear exam 0 X - Patient Transfer (multiple staff / / Similar devices) 1 10 []  - Simple Staple / Suture removal (25 or less) 0 []  - Complex Staple / Suture removal (26 or more) 0 []  - Hypo/Hyperglycemic Management (do not check if billed separately) 0 []  - Ankle / Brachial Index (ABI) -  do not check if billed separately 0 Has the patient been seen at the hospital within the last three years: Yes Total Score: 110 Level Of Care: New/Established - Level 3 Electronic Signature(s) Signed: 10/12/2019 5:57:14 PM By: Chiropractor RN, BSN Entered  By: Zandra Abts on 10/12/2019 10:33:10 -------------------------------------------------------------------------------- Multi Wound Chart Details Patient Name: Date of Service: Becky Hendricks, Becky Hendricks 10/12/2019 9:00 AM Medical Record [id number] Patient Account Number: [id number] Date of Birth/Sex: Treating RN: [birth year] (74 y.o. Wynelle Link Primary Care Haidan Nhan: SYSTEM, PCP Other Clinician: Referring Suhaylah Wampole: Treating Bitania Shankland/Extender:Robson, Lamar Sprinkles in Treatment: 0 Vital Signs Height(in): 66 Pulse(bpm): 102 Weight(lbs): 224 Blood Pressure(mmHg): 147/87 Body Mass Index(BMI): 36 Temperature(F): 98.2 Respiratory 20 Rate(breaths/min): Photos: [1:No Photos] [2:No Photos] [N/A:N/A] Wound Location: [1:Right Gluteus] [2:Left Gluteus] [N/A:N/A] Wounding Event: [1:Gradually Appeared] [2:Gradually Appeared] [N/A:N/A] Primary Etiology: [1:Pressure Ulcer] [2:Pressure Ulcer] [N/A:N/A] Comorbid History: [1:Hypertension, Type II Diabetes] [2:Hypertension, Type II Diabetes] [N/A:N/A] Date Acquired: [1:08/14/2019] [2:09/11/2019] [N/A:N/A] Weeks of Treatment: [1:0] [2:0] [N/A:N/A] Wound Status: [1:Open] [2:Open] [N/A:N/A] Measurements L x W x D 9.2x7x8.5 [2:3.5x0.7x0.1] [N/A:N/A] (cm) Area (cm) : [1:50.58] [2:1.924] [N/A:N/A] Volume (cm) : [1:429.927] [2:0.192] [N/A:N/A] % Reduction in Area: [1:0.00%] [2:0.00%] [N/A:N/A] % Reduction in Volume: 0.00% [2:0.00%] [N/A:N/A] Classification: [1:Category/Stage IV] [2:Category/Stage III] [N/A:N/A] Exudate Amount: [1:Medium] [2:Medium] [N/A:N/A] Exudate Type: [1:Serosanguineous] [2:Serosanguineous] [N/A:N/A] Exudate Color: [1:red, brown] [2:red, brown] [N/A:N/A] Wound Margin:  [1:N/A] [2:Flat and Intact] [N/A:N/A] Granulation Amount: [1:Medium (34-66%)] [2:Small (1-33%)] [N/A:N/A] Granulation Quality: [1:Red, Pink] [2:Pink] [N/A:N/A] Necrotic Amount: [1:Medium (34-66%)] [2:Large (67-100%)] [N/A:N/A] Necrotic Tissue: [1:Eschar] [2:Adherent Slough] [N/A:N/A] Exposed Structures: [1:Fat Layer (Subcutaneous Tissue) Exposed: Yes Muscle: Yes Bone: Yes Fascia: No Tendon: No Joint: No] [2:Fascia: No Fat Layer (Subcutaneous Tissue) Exposed: No Tendon: No Muscle: No Joint: No Bone: No] [N/A:N/A] Epithelialization: [1:N/A] [2:None] [N/A:N/A] Debridement: [1:Debridement - Excisional] [2:N/A] [N/A:N/A] Pre-procedure [1:10:15] [2:N/A] [N/A:N/A] Verification/Time Out Taken: Tissue Debrided: [1:Necrotic/Eschar, Muscle, Slough] [2:N/A] [N/A:N/A] Level: [1:Skin/Subcutaneous Tissue/Muscle] [2:N/A] [N/A:N/A] Debridement Area (sq cm):20 [2:N/A] [N/A:N/A] Instrument: [1:Blade, Forceps] [2:N/A] [N/A:N/A] Specimen: [1:Swab] [2:N/A] [N/A:N/A] Number of Specimens [1:1] [2:N/A] [N/A:N/A] Taken: Bleeding: [1:Moderate] [2:N/A] [N/A:N/A] Hemostasis Achieved: [1:Silver Nitrate] [2:N/A] [N/A:N/A] Procedural Pain: [1:2] [2:N/A] [N/A:N/A] Post Procedural Pain: [1:0] [2:N/A] [N/A:N/A] Debridement Treatment Procedure was tolerated [2:N/A] [N/A:N/A] Response: [1:well] Post Debridement [1:9.2x7x8.5] [2:N/A] [N/A:N/A] Measurements L x W x D (cm) Post Debridement [1:429.927] [2:N/A] [N/A:N/A] Volume: (cm) Post Debridement Stage: Category/Stage IV [2:N/A N/A] [N/A:N/A N/A] Treatment Notes Electronic Signature(s) Signed: 10/12/2019 5:45:55 PM By: Baltazar Najjar MD Signed: 10/12/2019 5:57:14 PM By: Zandra Abts RN, BSN Entered By: Baltazar Najjar on 10/12/2019 10:38:53 -------------------------------------------------------------------------------- Multi-Disciplinary Care Plan Details Patient Name: Date of Service: Becky Hendricks, Becky Hendricks 10/12/2019 9:00 AM Medical Record [id number] Patient  Account Number: [id number] Date of Birth/Sex: Treating RN: 1946/04/20 (74 y.o. Wynelle Link Primary Care Anterio Scheel: SYSTEM, PCP Other Clinician: Referring Sabena Winner: Treating Lenzi Marmo/Extender:Robson, Lamar Sprinkles in Treatment: 0 Active Inactive Abuse / Safety / Falls / Self Care Management Nursing Diagnoses: Potential for falls Potential for injury related to falls Goals: Patient will not experience any injury related to falls Date Initiated: 10/12/2019 Target Resolution Date: 11/13/2019 Goal Status: Active Patient/caregiver will verbalize/demonstrate measure taken to improve self care Date Initiated: 10/12/2019 Target Resolution Date: 11/13/2019 Goal Status: Active Patient/caregiver will verbalize/demonstrate measures taken to improve the patient's personal safety Date Initiated: 10/12/2019 Target Resolution Date: 11/13/2019 Goal Status: Active Interventions: Assess Activities of Daily Living upon admission and as needed Assess fall risk on admission and as needed Assess: immobility, friction, shearing, incontinence upon admission and as needed Assess impairment of mobility on admission and as needed per policy Assess personal safety and home safety (as indicated) on admission and as needed Assess self care needs on admission and as needed Provide education on fall prevention Provide education on personal and home safety  Notes: Nutrition Nursing Diagnoses: Imbalanced nutrition Impaired glucose control: actual or potential Potential for alteratiion in Nutrition/Potential for imbalanced nutrition Goals: Patient/caregiver agrees to and verbalizes understanding of need to use nutritional supplements and/or vitamins as prescribed Date Initiated: 10/12/2019 Target Resolution Date: 11/13/2019 Goal Status: Active Patient/caregiver will maintain therapeutic glucose control Date Initiated: 10/12/2019 Target Resolution Date: 11/13/2019 Goal Status: Active Interventions: Assess HgA1c results  as ordered upon admission and as needed Assess patient nutrition upon admission and as needed per policy Provide education on elevated blood sugars and impact on wound healing Provide education on nutrition Notes: Pressure Nursing Diagnoses: Knowledge deficit related to causes and risk factors for pressure ulcer development Knowledge deficit related to management of pressures ulcers Potential for impaired tissue integrity related to pressure, friction, moisture, and shear Goals: Patient/caregiver will verbalize risk factors for pressure ulcer development Date Initiated: 10/12/2019 Target Resolution Date: 11/13/2019 Goal Status: Active Patient/caregiver will verbalize understanding of pressure ulcer management Date Initiated: 10/12/2019 Target Resolution Date: 11/13/2019 Goal Status: Active Interventions: Assess: immobility, friction, shearing, incontinence upon admission and as needed Assess offloading mechanisms upon admission and as needed Assess potential for pressure ulcer upon admission and as needed Provide education on pressure ulcers Notes: Wound/Skin Impairment Nursing Diagnoses: Impaired tissue integrity Knowledge deficit related to ulceration/compromised skin integrity Goals: Patient/caregiver will verbalize understanding of skin care regimen Date Initiated: 10/12/2019 Target Resolution Date: 11/13/2019 Goal Status: Active Interventions: Assess patient/caregiver ability to obtain necessary supplies Assess patient/caregiver ability to perform ulcer/skin care regimen upon admission and as needed Assess ulceration(s) every visit Provide education on ulcer and skin care Notes: Electronic Signature(s) Signed: 10/12/2019 5:57:14 PM By: Levan Hurst RN, BSN Entered By: Levan Hurst on 10/12/2019 10:32:09 -------------------------------------------------------------------------------- Pain Assessment Details Patient Name: Date of Service: Becky Hendricks, Becky Hendricks 10/12/2019 9:00  AM Medical Record [id number] Patient Account Number: [id number] Date of Birth/Sex: Treating RN: [birth year] (74 y.o. Orvan Falconer Primary Care Dayzha Pogosyan: SYSTEM, PCP Other Clinician: Referring Wakeelah Solan: Treating Kosta Schnitzler/Extender:Robson, Esperanza Richters in Treatment: 0 Active Problems Location of Pain Severity and Description of Pain Patient Has Paino Yes Site Locations With Dressing Change: Yes Duration of the Pain. Constant / Intermittento Intermittent How Long Does it Lasto Hours: 1 Minutes: Rate the pain. Current Pain Level: 4 Worst Pain Level: 10 Least Pain Level: 2 Tolerable Pain Level: 5 Character of Pain Describe the Pain: Aching, Burning Pain Management and Medication Current Pain Management: Medication: Yes Cold Application: No Rest: Yes Massage: No Activity: No T.E.N.S.: No Heat Application: No Leg drop or elevation: No Is the Current Pain Management Adequate: Inadequate How does your wound impact your activities of daily livingo Sleep: Yes Bathing: No Appetite: Yes Relationship With Others: No Bladder Continence: No Emotions: No Bowel Continence: No Work: No Toileting: No Drive: No Dressing: No Hobbies: No Electronic Signature(s) Signed: 10/13/2019 9:14:02 AM By: Carlene Coria RN Entered By: Carlene Coria on 10/12/2019 09:47:21 -------------------------------------------------------------------------------- Patient/Caregiver Education Details Patient Name: Date of Service: Becky Hendricks 3/1/2021andnbsp9:00 AM Medical Record [id number] Patient Account Number: [id number] Date of Birth/Gender: Treating RN: Feb 22, 1946 (74 y.o. [name] Primary Care Physician: SYSTEM, PCP Other Clinician: Referring Physician: Treating Physician/Extender:Robson, Esperanza Richters in Treatment: 0 Education Assessment Education Provided To: Patient Education Topics Provided Elevated Blood Sugar/ Impact on Healing: Methods: Explain/Verbal Responses:  State content correctly Nutrition: Methods: Explain/Verbal Responses: State content correctly Pressure: Methods: Explain/Verbal Responses: State content correctly Safety: Methods: Explain/Verbal Responses: State content correctly Wound/Skin Impairment: Methods: Explain/Verbal Responses: State content correctly Electronic Signature(s) Signed: 10/12/2019 5:57:14 PM By:  Zandra Abts RN, BSN Entered By: Zandra Abts on 10/12/2019 10:32:28 -------------------------------------------------------------------------------- Wound Assessment Details Patient Name: Date of Service: Becky Hendricks, Becky Hendricks 10/12/2019 9:00 AM Medical Record [id number] Patient Account Number: [id number] Date of Birth/Sex: Treating RN: 1946/07/06 (74 y.o. Wynelle Link Primary Care Carlyon Nolasco: SYSTEM, PCP Other Clinician: Referring Ahmod Gillespie: Treating Hope Holst/Extender:Robson, Lamar Sprinkles in Treatment: 0 Wound Status Wound Number: 1 Primary Etiology: Pressure Ulcer Wound Location: Right Gluteus Wound Status: Open Wounding Event: Gradually Appeared Comorbid History: Hypertension, Type II Diabetes Date Acquired: 08/14/2019 Weeks Of Treatment: 0 Clustered Wound: No Photos Wound Measurements Length: (cm) 9.2 % Reduction Width: (cm) 7 % Reduction Depth: (cm) 8.5 Tunneling: Area: (cm) 50.58 Undermining Volume: (cm) 429.927 Wound Description Classification: Category/Stage IV Foul Odor A Exudate Amount: Medium Slough/Fibr Exudate Type: Serosanguineous Exudate Color: red, brown Wound Bed Granulation Amount: Medium (34-66%) Granulation Quality: Red, Pink Fascia Expos Necrotic Amount: Medium (34-66%) Fat Layer (S Necrotic Quality: Eschar Tendon Expos Muscle Expos Necrosi Joint Expose Bone Exposed fter Cleansing: No ino Yes Exposed Structure ed: No ubcutaneous Tissue) Exposed: Yes ed: No ed: Yes s of Muscle: No d: No : Yes in Area: 0% in Volume: 0% No : No Electronic Signature(s) Signed:  10/22/2019 4:27:59 PM By: Benjaman Kindler EMT/HBOT Signed: 11/25/2019 9:05:01 AM By: Zandra Abts RN, BSN Previous Signature: 10/13/2019 9:14:02 AM Version By: Yevonne Pax RN Entered By: Benjaman Kindler on 10/22/2019 13:42:08 -------------------------------------------------------------------------------- Wound Assessment Details Patient Name: Date of Service: Becky Hendricks, Becky Hendricks 10/12/2019 9:00 AM Medical Record [id number] Patient Account Number: [id number] Date of Birth/Sex: Treating RN: 13-Apr-1946 (74 y.o. Wynelle Link Primary Care Shahrzad Koble: SYSTEM, PCP Other Clinician: Referring Savayah Waltrip: Treating Obryan Radu/Extender:Robson, Lamar Sprinkles in Treatment: 0 Wound Status Wound Number: 2 Primary Etiology: Pressure Ulcer Wound Location: Left Gluteus Wound Status: Open Wounding Event: Gradually Appeared Comorbid History: Hypertension, Type II Diabetes Date Acquired: 09/11/2019 Weeks Of Treatment: 0 Clustered Wound: No Photos Wound Measurements Length: (cm) 3.5 Width: (cm) 0.7 Depth: (cm) 0.1 Area: (cm) 1.924 Volume: (cm) 0.192 Wound Description Classification: Category/Stage III Wound Margin: Flat and Intact Exudate Amount: Medium Exudate Type: Serosanguineous Exudate Color: red, brown Wound Bed Granulation Amount: Small (1-33%) Granulation Quality: Pink Necrotic Amount: Large (67-100%) Necrotic Quality: Adherent Slough r After Cleansing: No ibrino Yes Exposed Structure posed: No (Subcutaneous Tissue) Exposed: No posed: No posed: No osed: No ed: No % Reduction in Area: 0% % Reduction in Volume: 0% Epithelialization: None Tunneling: No Undermining: No Foul Odo Slough/F Fascia Ex Fat Layer Tendon Ex Muscle Ex Joint Exp Bone Expos Electronic Signature(s) Signed: 10/22/2019 4:27:59 PM By: Benjaman Kindler EMT/HBOT Signed: 11/25/2019 9:05:01 AM By: Zandra Abts RN, BSN Previous Signature: 10/12/2019 5:57:14 PM Version By: Zandra Abts RN, BSN Entered By:  Benjaman Kindler on 10/22/2019 13:43:29 -------------------------------------------------------------------------------- Vitals Details Patient Name: Date of Service: Becky Hendricks, Becky Hendricks 10/12/2019 9:00 AM Medical Record [id number] Patient Account Number: [id number] Date of Birth/Sex: Treating RN: 09/25/1945 (74 y.o. [name] Primary Care Inna Tisdell: SYSTEM, PCP Other Clinician: Referring Tayvia Faughnan: Treating Fabricio Endsley/Extender:Robson, Lamar Sprinkles in Treatment: 0 Vital Signs Time Taken: 09:18 Temperature (F): 98.2 Height (in): 66 Pulse (bpm): 102 Source: Stated Respiratory Rate (breaths/min): 20 Weight (lbs): 224 Blood Pressure (mmHg): 147/87 Source: Stated Reference Range: 80 - 120 mg / dl Body Mass Index (BMI): 36.2 Electronic Signature(s) Signed: 10/13/2019 9:14:02 AM By: Yevonne Pax RN Entered By: Yevonne Pax on 10/12/2019 09:18:45

## 2019-10-13 NOTE — Progress Notes (Signed)
Becky, Hendricks ([id number]) Visit Report for 10/12/2019 Abuse/Suicide Risk Screen Details Patient Name: Date of Service: Becky Hendricks, Becky Hendricks 10/12/2019 9:00 AM Medical Record [id number] Patient Account Number: [id number] Date of Birth/Sex: Treating RN: 04-18-1946 (74 y.o. Orvan Falconer Primary Care Fayette Gasner: SYSTEM, PCP Other Clinician: Referring Dolan Xia: Treating Havilah Topor/Extender:Robson, Esperanza Richters in Treatment: 0 Abuse/Suicide Risk Screen Items Answer ABUSE RISK SCREEN: Has anyone close to you tried to hurt or harm you recentlyo No Do you feel uncomfortable with anyone in your familyo No Has anyone forced you do things that you didnt want to doo No Electronic Signature(s) Signed: 10/13/2019 9:14:02 AM By: Carlene Coria RN Entered By: Carlene Coria on 10/12/2019 09:25:21 -------------------------------------------------------------------------------- Activities of Daily Living Details Patient Name: Date of Service: Becky, Hendricks 10/12/2019 9:00 AM Medical Record [id number] Patient Account Number: [id number] Date of Birth/Sex: Treating RN: April 10, 1946 (74 y.o. Orvan Falconer Primary Care Analia Zuk: SYSTEM, PCP Other Clinician: Referring Michalina Calbert: Treating Quadry Kampa/Extender:Robson, Esperanza Richters in Treatment: 0 Activities of Daily Living Items Answer Activities of Daily Living (Please select one for each item) Drive Automobile Not Able Take Medications Not Able Use Telephone Not Able Care for Appearance Not Able Use Toilet Not Able Manus Rudd / Shower Not Able Dress Self Not Able Feed Self Need Assistance Walk Not Able Get In / Out Bed Not Able Housework Not Able Prepare Meals Not Able Handle Money Not Able Shop for Self Not Able Electronic Signature(s) Signed: 10/13/2019 9:14:02 AM By: Carlene Coria RN Entered By: Carlene Coria on 10/12/2019 09:26:33 -------------------------------------------------------------------------------- Education Screening Details Patient  Name: Date of Service: Becky, Hendricks 10/12/2019 9:00 AM Medical Record [id number] Patient Account Number: [id number] Date of Birth/Sex: Treating RN: Oct 24, 1945 (74 y.o. Orvan Falconer Primary Care Tasneem Cormier: SYSTEM, PCP Other Clinician: Referring Sulaiman Imbert: Treating Lavance Beazer/Extender:Robson, Esperanza Richters in Treatment: 0 Primary Learner Assessed: Patient Learning Preferences/Education Level/Primary Language Learning Preference: Explanation Highest Education Level: College or Above Preferred Language: English Cognitive Barrier Language Barrier: No Translator Needed: No Memory Deficit: No Emotional Barrier: No Cultural/Religious Beliefs Affecting Medical Care: No Physical Barrier Impaired Vision: Yes Glasses Impaired Hearing: No Decreased Hand dexterity: No Knowledge/Comprehension Knowledge Level: Medium Comprehension Level: High Ability to understand written High instructions: Ability to understand verbal High instructions: Motivation Anxiety Level: Anxious Cooperation: Cooperative Education Importance: Acknowledges Need Interest in Health Problems: Asks Questions Perception: Coherent Willingness to Engage in Self- High Management Activities: Readiness to Engage in Self- High Management Activities: Electronic Signature(s) Signed: 10/13/2019 9:14:02 AM By: Carlene Coria RN Entered By: Carlene Coria on 10/12/2019 09:28:18 -------------------------------------------------------------------------------- Fall Risk Assessment Details Patient Name: Date of Service: VAIDA, Hendricks 10/12/2019 9:00 AM Medical Record [id number] Patient Account Number: [id number] Date of Birth/Sex: Treating RN: Jul 06, 1946 (74 y.o. Orvan Falconer Primary Care Sanja Elizardo: SYSTEM, PCP Other Clinician: Referring Roshun Klingensmith: Treating Malayah Demuro/Extender:Robson, Esperanza Richters in Treatment: 0 Fall Risk Assessment Items Have you had 2 or more falls in the last 12 monthso 0 Yes Have you had any  fall that resulted in injury in the last 12 monthso 0 Yes FALLS RISK SCREEN History of falling - immediate or within 3 months 25 Yes Secondary diagnosis (Do you have 2 or more medical diagnoseso) 0 No Ambulatory aid None/bed rest/wheelchair/nurse 0 No Crutches/cane/walker 0 No Furniture 0 No Intravenous therapy Access/Saline/Heparin Lock 0 No Weak (short steps with or without shuffle, stooped but able to lift head 0 No while walking, may seek support from furniture) Impaired (short steps with shuffle, may have difficulty arising from chair, 0 No head down, impaired balance) Mental Status  Oriented to own ability 0 No Overestimates or forgets limitations 0 No Risk Level: Medium Risk Score: 25 Electronic Signature(s) Signed: 10/13/2019 9:14:02 AM By: Yevonne Pax RN Entered By: Yevonne Pax on 10/12/2019 09:29:53 -------------------------------------------------------------------------------- Nutrition Risk Screening Details Patient Name: Date of Service: Becky, Hendricks 10/12/2019 9:00 AM Medical Record [id number] Patient Account Number: [id number] Date of Birth/Sex: Treating RN: [birth year] (74 y.o. [name] Primary Care Rivaldo Hineman: SYSTEM, PCP Other Clinician: Referring Jashley Yellin: Treating Lennette Fader/Extender:Robson, Lamar Sprinkles in Treatment: 0 Height (in): 66 Weight (lbs): 224 Body Mass Index (BMI): 36.2 Nutrition Risk Screening Items Score Screening NUTRITION RISK SCREEN: I have an illness or condition that made me change the kind and/or 0 No amount of food I eat I eat fewer than two meals per day 0 No I eat few fruits and vegetables, or milk products 0 No I have three or more drinks of beer, liquor or wine almost every day 0 No I have tooth or mouth problems that make it hard for me to eat 0 No I don't always have enough money to buy the food I need 0 No I eat alone most of the time 0 No I take three or more different prescribed or over-the-counter drugs a day 1  Yes 2 Yes Without wanting to, I have lost or gained 10 pounds in the last six months I am not always physically able to shop, cook and/or feed myself 2 Yes Nutrition Protocols Good Risk Protocol Provide education on Moderate Risk Protocol 0 nutrition High Risk Proctocol Risk Level: Moderate Risk Score: 5 Electronic Signature(s) Signed: 10/13/2019 9:14:02 AM By: Yevonne Pax RN Entered By: Yevonne Pax on 10/12/2019 09:30:11

## 2019-10-16 LAB — AEROBIC CULTURE W GRAM STAIN (SUPERFICIAL SPECIMEN)

## 2019-10-26 ENCOUNTER — Encounter (HOSPITAL_BASED_OUTPATIENT_CLINIC_OR_DEPARTMENT_OTHER): Payer: Medicare Other | Admitting: Internal Medicine

## 2019-10-27 ENCOUNTER — Other Ambulatory Visit: Payer: Self-pay

## 2019-10-27 ENCOUNTER — Encounter (HOSPITAL_BASED_OUTPATIENT_CLINIC_OR_DEPARTMENT_OTHER): Payer: Medicare Other | Admitting: Internal Medicine

## 2019-10-27 DIAGNOSIS — L89314 Pressure ulcer of right buttock, stage 4: Secondary | ICD-10-CM | POA: Diagnosis not present

## 2019-10-28 NOTE — Progress Notes (Signed)
Becky Hendricks, Becky Hendricks ([id number]) Visit Report for 10/27/2019 Debridement Details Patient Name: Date of Service: Becky Hendricks, Becky Hendricks 10/27/2019 8:45 AM Medical Record [id number] Patient Account Number: [id number] Date of Birth/Sex: Treating RN: [birth year] (74 y.o. [name] Primary Care Provider: SYSTEM, PCP Other Clinician: Referring Provider: Treating Provider/Extender:Phyllip Claw, Lamar Sprinkles in Treatment: 2 Debridement Performed for Wound #1 Right Gluteus Assessment: Performed By: Physician Maxwell Caul., MD Debridement Type: Debridement Level of Consciousness (Pre- Awake and Alert procedure): Pre-procedure Yes - 10:02 Verification/Time Out Taken: Start Time: 10:02 Pain Control: Lidocaine 5% topical ointment Total Area Debrided (L x W): 4 (cm) x 6 (cm) = 24 (cm) Tissue and other material Viable, Non-Viable, Slough, Subcutaneous, Skin: Dermis , Skin: Epidermis, Slough debrided: Level: Skin/Subcutaneous Tissue Debridement Description: Excisional Instrument: Blade, Forceps Bleeding: Large Hemostasis Achieved: Silver Nitrate End Time: 10:07 Procedural Pain: 4 Post Procedural Pain: 2 Response to Treatment: Procedure was tolerated well Level of Consciousness Awake and Alert (Post-procedure): Post Debridement Measurements of Total Wound Length: (cm) 8.7 Stage: Category/Stage IV Width: (cm) 6 Depth: (cm) 5.1 Volume: (cm) 209.089 Character of Wound/Ulcer Post Improved Debridement: Post Procedure Diagnosis Same as Pre-procedure Electronic Signature(s) Signed: 10/27/2019 5:29:05 PM By: Baltazar Najjar MD Signed: 10/28/2019 8:57:53 AM By: Yevonne Pax RN Entered By: Baltazar Najjar on 10/27/2019 10:29:33 -------------------------------------------------------------------------------- HPI Details Patient Name: Date of Service: Becky Hendricks, Becky Hendricks 10/27/2019 8:45 AM Medical Record [id number] Patient Account Number: [id number] Date of Birth/Sex: Treating RN: [birth year]  (74 y.o. [name] Primary Care Provider: SYSTEM, PCP Other Clinician: Referring Provider: Treating Provider/Extender:Kayle Passarelli, Lamar Sprinkles in Treatment: 2 History of Present Illness HPI Description: ADMISSION 10/12/2019 This is a 74 year old woman whose problem began sometime in December 2020 when she was found on the floor in her condo in [place]. She was admitted to [facility] apparently dehydrated with some form of "intestinal infection". She was discharged to [facility] healthcare in [place] and sometime over the next several weeks that she was there she developed pressure ulcers on her buttock. Her brother who accompanies her today says that he was not aware of this still the patient was admitted to [facility] for Covid pneumonia from 1/25 through 2/9. At that point the treatment was Santyl and then wet-to-dry dressings. Also notable on 2/5 she had an albumin of 2.5 and a C-reactive protein of 1.7. The patient is now at [facility] skilled facility. I am not exactly sure of her functional status although her brother said she can stand and pivot transfer to a wheelchair but cannot walk. Past medical history; atrial fibrillation on Eliquis, bipolar affective disorder, recent diagnosis of type 2 diabetes with a hemoglobin A1c of 9.5 pseudomembranous colitis and hypertension. Currently the patient has a very large stage IV wound in close proximity to the gluteal cleft. Copious necrotic material and odor. She has a smaller superficial stage III wound on the left buttock. I am unclear what they are using at the nursing home currently 3/16; second visit for this patient with a very large stage IV wound over her right buttock in close proximity to the gluteal cleft. Culture I did a purulent drainage last time showed MRSA. I put her on doxycycline. There was supposed to do pelvic x-rays I have not seen these results. We are using silver alginate  with backing gauze Electronic Signature(s) Signed: 10/27/2019 5:29:05 PM By: Baltazar Najjar MD Entered By: Baltazar Najjar on 10/27/2019 10:27:12 -------------------------------------------------------------------------------- Physical Exam Details Patient Name: Date of Service: Becky Hendricks 10/27/2019 8:45  AM Medical Record [id number] Patient Account Number: [id number] Date of Birth/Sex: Treating RN: 08/10/1946 (74 y.o. [name] Primary Care Provider: SYSTEM, PCP Other Clinician: Referring Provider: Treating Provider/Extender:Halimah Bewick, Lamar Sprinkles in Treatment: 2 Constitutional Sitting or standing Blood Pressure is within target range for patient.. Pulse regular and within target range for patient.Marland Kitchen Respirations regular, non-labored and within target range.. Temperature is normal and within the target range for the patient.Marland Kitchen Appears in no distress. Notes Wound exam; large stage IV wound over the right upper buttock. There is exposed bone here and small areas copious amounts of necrotic malodorous material removed with a #15 scalpel and pickups. There is no evidence of surrounding soft tissue infection no crepitus no erythema but there is an odor. On the left side the small superficial wounds have closed Electronic Signature(s) Signed: 10/27/2019 5:29:05 PM By: Baltazar Najjar MD Entered By: Baltazar Najjar on 10/27/2019 10:28:08 -------------------------------------------------------------------------------- Physician Orders Details Patient Name: Date of Service: Becky Hendricks, Becky Hendricks 10/27/2019 8:45 AM Medical Record [id number] Patient Account Number: [id number] Date of Birth/Sex: Treating RN: 16-Aug-1945 (74 y.o. [name] Primary Care Provider: SYSTEM, PCP Other Clinician: Referring Provider: Treating Provider/Extender:Masey Scheiber, Lamar Sprinkles in Treatment: 2 Verbal / Phone Orders: No Diagnosis Coding ICD-10 Coding Code Description L89.314 Pressure ulcer of  right buttock, stage 4 L89.323 Pressure ulcer of left buttock, stage 3 E44.0 Moderate protein-calorie malnutrition Follow-up Appointments Return Appointment in 2 weeks. - ***[facility] - ROOM 5*** Dressing Change Frequency Wound #1 Right Gluteus Change dressing every day. Wound #2 Left Gluteus Change dressing every day. Skin Barriers/Peri-Wound Care Barrier cream Wound Cleansing Wound #1 Right Gluteus Clean wound with Wound Cleanser - or normal saline Wound #2 Left Gluteus Clean wound with Wound Cleanser - or normal saline Primary Wound Dressing Wound #1 Right Gluteus Calcium Alginate with Silver - fill space with gauze/kerlix Wound #2 Left Gluteus Calcium Alginate with Silver Secondary Dressing Wound #1 Right Gluteus ABD pad - secure with tape Wound #2 Left Gluteus ABD pad - secure with tape Off-Loading Low air-loss mattress (Group 2) Turn and reposition every 2 hours Electronic Signature(s) Signed: 10/27/2019 5:29:05 PM By: Baltazar Najjar MD Signed: 10/28/2019 8:57:53 AM By: Yevonne Pax RN Entered By: Yevonne Pax on 10/27/2019 09:13:05 -------------------------------------------------------------------------------- Problem List Details Patient Name: Date of Service: Becky Hendricks, Becky Hendricks 10/27/2019 8:45 AM Medical Record [id number] Patient Account Number: [id number] Date of Birth/Sex: Treating RN: [birth year] (74 y.o. 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 [name] Primary Care Provider: SYSTEM, PCP Other Clinician: Referring Provider: Treating Provider/Extender:Caitlain Tweed, Lamar Sprinkles in Treatment: 2 Subjective History of Present Illness (HPI) ADMISSION 10/12/2019 This is a 74 year old woman whose problem began sometime in December 2020 when she was found on the floor in her condo in [place]. She was admitted to [facility] apparently dehydrated with some form of "intestinal infection". She was discharged to [facility] healthcare in [place] and sometime over the  next several weeks that she was there she developed pressure ulcers on her buttock. Her brother who accompanies her today says that he was not aware of this still the patient was admitted to [facility] for Covid pneumonia from 1/25 through 2/9. At that point the treatment was Santyl and then wet-to-dry dressings. Also notable on 2/5 she had an albumin of 2.5 and a C-reactive protein of 1.7. The patient is now at [facility] skilled facility. I am not exactly sure of her functional status although her brother said she can stand and pivot transfer to a wheelchair but cannot walk. Past medical history; atrial fibrillation on Eliquis, bipolar affective disorder, recent diagnosis of type 2 diabetes with a hemoglobin A1c of 9.5 pseudomembranous colitis and hypertension. Currently the patient has a very large stage IV wound in close proximity to the gluteal cleft. Copious necrotic material and odor. She has a smaller superficial stage III wound on the left buttock. I am unclear what they are using at the nursing home  currently 3/16; second visit for this patient with a very large stage IV wound over her right buttock in close proximity to the gluteal cleft. Culture I did a purulent drainage last time showed MRSA. I put her on doxycycline. There was supposed to do pelvic x-rays I have not seen these results. We are using silver alginate with backing gauze Objective Constitutional Sitting or standing Blood Pressure is within target range for patient.. Pulse regular and within target range for patient.Marland Kitchen Respirations regular, non-labored and within target range.. Temperature is normal and within the target range for the patient.Marland Kitchen Appears in no distress. Vitals Time Taken: 9:28 AM, Height: 66 in, Weight: 224 lbs, BMI: 36.2, Temperature: 98.1 F, Pulse: 89 bpm, Respiratory Rate: 20 breaths/min, Blood Pressure: 136/56 mmHg. General Notes: Wound exam; large stage IV wound over the right upper buttock. There is exposed bone here and small areas copious amounts of necrotic malodorous material removed with a #15 scalpel and pickups. There is no evidence of surrounding soft tissue infection no crepitus no erythema but there is an odor. ooOn the left side the small superficial wounds have closed Integumentary (Hair, Skin) Wound #1 status is Open. Original cause of wound was Gradually Appeared. The wound is located on the Right Gluteus. The wound measures 8.7cm length x 6cm width x 5.1cm depth; 40.998cm^2 area and 209.089cm^3 volume. There is muscle and Fat Layer (Subcutaneous Tissue) Exposed exposed. There is no tunneling noted, however, there is undermining starting at 11:00 and ending at 2:00 with a maximum distance of 6.4cm. There is a large amount of serosanguineous drainage noted. Foul odor after cleansing was noted. The wound margin is well defined and not attached to the wound base. There is medium (34-66%) red, pink granulation within the wound bed. There is a medium (34-66%) amount of necrotic tissue within  the wound bed including Adherent Slough and Necrosis of Muscle. Wound #2 status is Open. Original cause of wound was Gradually Appeared. The wound is located on the Left Gluteus. The wound measures 0cm length x 0cm width x 0cm depth; 0cm^2 area and 0cm^3 volume. There is no tunneling or undermining noted. There is a none present amount of drainage noted. The wound margin is flat and intact. There is no granulation within the wound bed. There is no necrotic tissue within the wound bed. Assessment Active Problems ICD-10 Pressure ulcer of right buttock, stage 4 Pressure ulcer of left buttock, stage 3 Moderate protein-calorie  malnutrition Procedures Wound #1 Pre-procedure diagnosis of Wound #1 is a Pressure Ulcer located on the Right Gluteus . There was a Excisional Skin/Subcutaneous Tissue Debridement with a total area of 52.2 sq cm performed by Ricard Dillon., MD. With the following instrument(s): Blade, and Forceps to remove Viable and Non-Viable tissue/material. Material removed includes Subcutaneous Tissue, Slough, Skin: Dermis, and Skin: Epidermis after achieving pain control using Lidocaine 5% topical ointment. No specimens were taken. A time out was conducted at 10:02, prior to the start of the procedure. A Large amount of bleeding was controlled with Silver Nitrate. The procedure was tolerated well with a pain level of 4 throughout and a pain level of 2 following the procedure. Post Debridement Measurements: 8.7cm length x 6cm width x 5.1cm depth; 209.089cm^3 volume. Post debridement Stage noted as Category/Stage IV. Character of Wound/Ulcer Post Debridement is improved. Post procedure Diagnosis Wound #1: Same as Pre-Procedure Plan Follow-up Appointments: Return Appointment in 2 weeks. - ***[facility] - ROOM 5*** Dressing Change Frequency: Wound #1 Right Gluteus: Change dressing every day. Wound #2 Left Gluteus: Change dressing every day. Skin Barriers/Peri-Wound Care: Barrier  cream Wound Cleansing: Wound #1 Right Gluteus: Clean wound with Wound Cleanser - or normal saline Wound #2 Left Gluteus: Clean wound with Wound Cleanser - or normal saline Primary Wound Dressing: Wound #1 Right Gluteus: Calcium Alginate with Silver - fill space with gauze/kerlix Wound #2 Left Gluteus: Calcium Alginate with Silver Secondary Dressing: Wound #1 Right Gluteus: ABD pad - secure with tape Wound #2 Left Gluteus: ABD pad - secure with tape Off-Loading: Low air-loss mattress (Group 2) Turn and reposition every 2 hours 1. We will continue silver alginate and backing gauze change daily 2. Still looking for a pelvic x-ray report. She may require a CT scan or an MRI to look at the underlying bone. 3. I put her on doxycycline in response to a swab culture showing MRSA 4. I have asked for CMP CBC with differential C-reactive protein and sedimentation rate Electronic Signature(s) Signed: 10/27/2019 5:29:05 PM By: Linton Ham MD Entered By: Linton Ham on 10/27/2019 10:28:59 -------------------------------------------------------------------------------- SuperBill Details Patient Name: Date of Service: Becky Hendricks, Becky Hendricks 10/27/2019 Medical Record [id number] Patient Account Number: [id number] Date of Birth/Sex: Treating RN: March 07, 1946 (74 y.o. 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 Facility Procedures CPT4 Code: [id number] Description: 73532 - DEB SUBQ TISSUE 20 SQ CM/< ICD-10 Diagnosis Description L89.314 Pressure ulcer of right buttock, stage 4 Modifier: Quantity: 1 CPT4 Code: 99242683 Description: 41962 - DEB SUBQ TISS EA ADDL 20CM ICD-10 Diagnosis Description  L89.314 Pressure ulcer of right buttock, stage 4 Modifier: Quantity: 1 Physician Procedures CPT4 Code: [id number] Description: 21194 - WC PHYS SUBQ TISS 20 SQ CM ICD-10 Diagnosis Description L89.314 Pressure ulcer of right buttock, stage 4 Modifier: Quantity: 1 CPT4 Code: 1740814 Description: 48185 - WC PHYS SUBQ TISS EA ADDL 20 CM ICD-10 Diagnosis Description L89.314 Pressure ulcer of right buttock, stage 4 Modifier: Quantity: 1 Electronic Signature(s) Signed: 10/27/2019 5:29:05 PM By: Linton Ham MD Entered By: Linton Ham on 10/27/2019 10:29:55

## 2019-11-10 ENCOUNTER — Other Ambulatory Visit: Payer: Self-pay

## 2019-11-10 ENCOUNTER — Encounter (HOSPITAL_BASED_OUTPATIENT_CLINIC_OR_DEPARTMENT_OTHER): Payer: Medicare Other | Admitting: Internal Medicine

## 2019-11-10 ENCOUNTER — Other Ambulatory Visit (HOSPITAL_COMMUNITY)
Admission: RE | Admit: 2019-11-10 | Discharge: 2019-11-10 | Disposition: A | Discharge status: Home / Self Care | Payer: Medicare Other | Source: Other Acute Inpatient Hospital | Attending: Internal Medicine | Admitting: Internal Medicine

## 2019-11-10 ENCOUNTER — Other Ambulatory Visit (HOSPITAL_BASED_OUTPATIENT_CLINIC_OR_DEPARTMENT_OTHER): Payer: Self-pay | Admitting: Internal Medicine

## 2019-11-10 DIAGNOSIS — L89314 Pressure ulcer of right buttock, stage 4: Secondary | ICD-10-CM | POA: Insufficient documentation

## 2019-11-15 LAB — AEROBIC/ANAEROBIC CULTURE W GRAM STAIN (SURGICAL/DEEP WOUND): Gram Stain: NONE SEEN

## 2019-11-16 NOTE — Progress Notes (Signed)
Becky Hendricks, Becky Hendricks ([id number]) Visit Report for 11/10/2019 Debridement Details Patient Name: Date of Service: Becky Hendricks 11/10/2019 8:45 AM Medical Record [id number] Patient Account Number: [id number] Date of Birth/Sex: Treating RN: [birth year] (74 y.o. [name] Primary Care Provider: SYSTEM, PCP Other Clinician: Referring Provider: Treating Provider/Extender:Ceejay Kegley, Lamar Sprinkles in Treatment: 4 Debridement Performed for Wound #1 Right Gluteus Assessment: Performed By: Physician Maxwell Caul., MD Debridement Type: Debridement Level of Consciousness (Pre- Awake and Alert procedure): Pre-procedure Yes - 09:58 Verification/Time Out Taken: Start Time: 09:58 Pain Control: Lidocaine 5% topical ointment Total Area Debrided (L x W): 0.1 (cm) x 0.1 (cm) = 0.01 (cm) Tissue and other material Viable, Non-Viable, Bone debrided: Level: Skin/Subcutaneous Tissue/Muscle/Bone Debridement Description: Excisional Instrument: Nippers Specimen: Tissue Culture Number of Specimens Taken: 2 Bleeding: Moderate Hemostasis Achieved: Pressure End Time: 10:00 Procedural Pain: 3 Post Procedural Pain: 0 Response to Treatment: Procedure was tolerated well Level of Consciousness Awake and Alert (Post-procedure): Post Debridement Measurements of Total Wound Length: (cm) 8 Stage: Category/Stage IV Width: (cm) 5.7 Depth: (cm) 4.1 Volume: (cm) 536.468 Character of Wound/Ulcer Post Improved Debridement: Post Procedure Diagnosis Same as Pre-procedure Electronic Signature(s) Signed: 11/11/2019 5:40:01 PM By: Yevonne Pax RN Signed: 11/16/2019 7:44:34 AM By: Baltazar Najjar MD Entered By: Baltazar Najjar on 11/10/2019 10:06:01 -------------------------------------------------------------------------------- HPI Details Patient Name: Date of Service: Becky Hendricks, Becky Hendricks 11/10/2019 8:45 AM Medical Record [id number] Patient Account Number: [id number] Date of Birth/Sex: Treating  RN: [birth year] (74 y.o. [name] Primary Care Provider: SYSTEM, PCP Other Clinician: Referring Provider: Treating Provider/Extender:Keean Wilmeth, Lamar Sprinkles in Treatment: 4 History of Present Illness HPI Description: ADMISSION 10/12/2019 This is a 74 year old woman whose problem began sometime in December 2020 when she was found on the floor in her condo in [place]. She was admitted to [facility] apparently dehydrated with some form of "intestinal infection". She was discharged to [facility] healthcare in [place] and sometime over the next several weeks that she was there she developed pressure ulcers on her buttock. Her brother who accompanies her today says that he was not aware of this still the patient was admitted to [facility] campus for Covid pneumonia from 1/25 through 2/9. At that point the treatment was Santyl and then wet-to-dry dressings. Also notable on 2/5 she had an albumin of 2.5 and a C-reactive protein of 1.7. The patient is now at [facility] skilled facility. I am not exactly sure of her functional status although her brother said she can stand and pivot transfer to a wheelchair but cannot walk. Past medical history; atrial fibrillation on Eliquis, bipolar affective disorder, recent diagnosis of type 2 diabetes with a hemoglobin A1c of 9.5 pseudomembranous colitis and hypertension. Currently the patient has a very large stage IV wound in close proximity to the gluteal cleft. Copious necrotic material and odor. She has a smaller superficial stage III wound on the left buttock. I am unclear what they are using at the nursing home currently 3/16; second visit for this patient with a very large stage IV wound over her right buttock in close proximity to the gluteal cleft. Culture I did a purulent drainage last time showed MRSA. I put her on doxycycline. There was supposed to do pelvic x-rays I have not seen these results. We are using  silver alginate with backing gauze 3/30; in general her wound looks somewhat better although there is still exposed bone. This is in the right buttock with close proximity to the gluteal cleft. She completed the doxycycline  I put on last time. Pelvic x-rays did not show any evidence of osteomyelitis. Her lab work comprehensive metabolic panel showed a glucose of 249. Otherwise this was normal except for an albumin of 2.7. White count was 7.7 hemoglobin 10.7. Sedimentation rate was 107 and C-reactive protein at 18.28. Electronic Signature(s) Signed: 11/16/2019 7:44:34 AM By: Baltazar Najjarobson, Folashade Gamboa MD Entered By: Baltazar Najjarobson, Denis Koppel on 11/10/2019 09:59:26 -------------------------------------------------------------------------------- Physical Exam Details Patient Name: Date of Service: Becky Hendricks, Becky Hendricks 11/10/2019 8:45 AM Medical Record [id number] Patient Account Number: [id number] Date of Birth/Sex: Treating RN: 02/07/1946 (74 y.o. Freddy FinnerF) Epps, Carrie Primary Care Provider: SYSTEM, PCP Other Clinician: Referring Provider: Treating Provider/Extender:Cayton Cuevas, Lamar SprinklesMichael Weeks in Treatment: 4 Constitutional Patient is hypertensive.. Pulse regular and within target range for patient.Marland Kitchen. Respirations regular, non-labored and within target range.. Temperature is normal and within the target range for the patient.Marland Kitchen. Appears in no distress. Notes Wound exam; large stage IV wound of the right upper buttock. This is actually a lot better looking than when I first saw this. There is still exposed bone but most of the tissue here looks reasonably healthy. She still has small areas of exposed bone. I obtain specimens for pathology and bone for culture from the upper part of the bone and the undermining area superiorly. Hemostasis with direct pressure. The area on the left buttock is closed Electronic Signature(s) Signed: 11/16/2019 7:44:34 AM By: Baltazar Najjarobson, Macklen Wilhoite MD Entered By: Baltazar Najjarobson, Malarie Tappen on 11/10/2019  10:00:33 -------------------------------------------------------------------------------- Physician Orders Details Patient Name: Date of Service: Becky Hendricks, Becky Hendricks 11/10/2019 8:45 AM Medical Record [id number] Patient Account Number: [id number] Date of Birth/Sex: Treating RN: 08/25/1945 (74 y.o. Freddy FinnerF) Epps, Carrie Primary Care Provider: SYSTEM, PCP Other Clinician: Referring Provider: Treating Provider/Extender:Demarian Epps, Lamar SprinklesMichael Weeks in Treatment: 4 Verbal / Phone Orders: No Diagnosis Coding ICD-10 Coding Code Description L89.314 Pressure ulcer of right buttock, stage 4 L89.323 Pressure ulcer of left buttock, stage 3 E44.0 Moderate protein-calorie malnutrition Follow-up Appointments Return Appointment in 2 weeks. - ***[facility] - ROOM 5*** Dressing Change Frequency Wound #1 Right Gluteus Change dressing every day. Skin Barriers/Peri-Wound Care Barrier cream Wound Cleansing Wound #1 Right Gluteus Clean wound with Wound Cleanser - or normal saline Primary Wound Dressing Wound #1 Right Gluteus Calcium Alginate with Silver - fill space with gauze/kerlix Secondary Dressing Wound #1 Right Gluteus ABD pad - secure with tape Off-Loading Low air-loss mattress (Group 2) Turn and reposition every 2 hours Laboratory Bacteria identified in Unspecified specimen by Anaerobe culture (MICRO) - non healing wound right glut - (ICD10 L89.314 - Pressure ulcer of right buttock, stage 4) LOINC Code: 635-3 Convenience Name: Anerobic culture Bacteria identified in Tissue by Biopsy culture (MICRO) - non healing right glut - (ICD10 L89.314 - Pressure ulcer of right buttock, stage 4) LOINC Code: 52841-320474-3 Convenience Name: Biopsy specimen culture Electronic Signature(s) Signed: 11/11/2019 5:40:01 PM By: Yevonne PaxEpps, Carrie RN Signed: 11/16/2019 7:44:34 AM By: Baltazar Najjarobson, Ebbie Sorenson MD Entered By: Yevonne PaxEpps, Carrie on 11/10/2019  09:58:26 -------------------------------------------------------------------------------- Problem List Details Patient Name: Date of Service: Becky Hendricks, Olukemi 11/10/2019 8:45 AM Medical Record [id number] Patient Account Number: [id number] Date of Birth/Sex: Treating RN: 12/01/1945 (74 y.o. 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.0 Moderate protein-calorie malnutrition 10/12/2019 No Yes Inactive Problems ICD-10 Code Description Active Date Inactive Date L89.323 Pressure ulcer of left buttock, stage 3 10/12/2019 10/12/2019 Resolved Problems Electronic  Signature(s) Signed: 11/16/2019 7:44:34 AM By: Baltazar Najjar MD Entered By: Baltazar Najjar on 11/10/2019 09:58:01 -------------------------------------------------------------------------------- Progress Note Details Patient Name: Date of Service: Becky Hendricks, Becky Hendricks 11/10/2019 8:45 AM Medical Record [id number] Patient Account Number: [id number] Date of Birth/Sex: Treating RN: Apr 25, 1946 (74 y.o. [name] Primary Care Provider: SYSTEM, PCP Other Clinician: Referring Provider: Treating Provider/Extender:Stacy Deshler, Lamar Sprinkles in Treatment: 4 Subjective History of Present Illness (HPI) ADMISSION 10/12/2019 This is a 74 year old woman whose problem began sometime in December 2020 when she was found on the floor in her condo in [place]. She was admitted to [facility] apparently dehydrated with some form of "intestinal infection". She was discharged to [facility] healthcare in [place] and sometime over the next several weeks that she was there she developed pressure ulcers on her buttock. Her brother who accompanies her today says that he was not  aware of this still the patient was admitted to [facility] campus for Covid pneumonia from 1/25 through 2/9. At that point the treatment was Santyl and then wet-to-dry dressings. Also notable on 2/5 she had an albumin of 2.5 and a C-reactive protein of 1.7. The patient is now at [facility] skilled facility. I am not exactly sure of her functional status although her brother said she can stand and pivot transfer to a wheelchair but cannot walk. Past medical history; atrial fibrillation on Eliquis, bipolar affective disorder, recent diagnosis of type 2 diabetes with a hemoglobin A1c of 9.5 pseudomembranous colitis and hypertension. Currently the patient has a very large stage IV wound in close proximity to the gluteal cleft. Copious necrotic material and odor. She has a smaller superficial stage III wound on the left buttock. I am unclear what they are using at the nursing home currently 3/16; second visit for this patient with a very large stage IV wound over her right buttock in close proximity to the gluteal cleft. Culture I did a purulent drainage last time showed MRSA. I put her on doxycycline. There was supposed to do pelvic x-rays I have not seen these results. We are using silver alginate with backing gauze 3/30; in general her wound looks somewhat better although there is still exposed bone. This is in the right buttock with close proximity to the gluteal cleft. She completed the doxycycline I put on last time. Pelvic x-rays did not show any evidence of osteomyelitis. Her lab work comprehensive metabolic panel showed a glucose of 249. Otherwise this was normal except for an albumin of 2.7. White count was 7.7 hemoglobin 10.7. Sedimentation rate was 107 and C-reactive protein at 18.28. Objective Constitutional Patient is hypertensive.. Pulse regular and within target range for patient.Marland Kitchen Respirations regular, non-labored and within target range.. Temperature is  normal and within the target range for the patient.Marland Kitchen Appears in no distress. Vitals Time Taken: 9:30 AM, Height: 66 in, Weight: 224 lbs, BMI: 36.2, Temperature: 98.8 F, Pulse: 88 bpm, Respiratory Rate: 20 breaths/min, Blood Pressure: 149/64 mmHg. General Notes: Wound exam; large stage IV wound of the right upper buttock. This is actually a lot better looking than when I first saw this. There is still exposed bone but most of the tissue here looks reasonably healthy. She still has small areas of exposed bone. I obtain specimens for pathology and bone for culture from the upper part of the bone and the undermining area superiorly. Hemostasis with direct pressure. ooThe area on the left buttock is closed Integumentary (Hair, Skin) Wound #1 status is Open. Original cause  of wound was Gradually Appeared. The wound is located on the Right Gluteus. The wound measures 8cm length x 5.7cm width x 4.1cm depth; 35.814cm^2 area and 146.838cm^3 volume. There is muscle and Fat Layer (Subcutaneous Tissue) Exposed exposed. There is a large amount of serosanguineous drainage noted. Foul odor after cleansing was noted. The wound margin is well defined and not attached to the wound base. There is medium (34-66%) red, pink granulation within the wound bed. There is a medium (34-66%) amount of necrotic tissue within the wound bed including Adherent Slough and Necrosis of Muscle. Wound #2 status is Healed - Epithelialized. Original cause of wound was Gradually Appeared. The wound is located on the Left Gluteus. The wound measures 0cm length x 0cm width x 0cm depth; 0cm^2 area and 0cm^3 volume. There is no tunneling or undermining noted. There is a none present amount of drainage noted. The wound margin is flat and intact. There is no granulation within the wound bed. There is no necrotic tissue within the wound bed. Assessment Active Problems ICD-10 Pressure ulcer of right buttock, stage 4 Moderate  protein-calorie malnutrition Procedures Wound #1 Pre-procedure diagnosis of Wound #1 is a Pressure Ulcer located on the Right Gluteus . There was a Excisional Skin/Subcutaneous Tissue/Muscle/Bone Debridement with a total area of 0.01 sq cm performed by Ricard Dillon., MD. With the following instrument(s): Nippers to remove Viable and Non-Viable tissue/material. Material removed includes Bone after achieving pain control using Lidocaine 5% topical ointment. 2 specimens were taken by a Tissue Culture and sent to the lab per facility protocol. A time out was conducted at 09:58, prior to the start of the procedure. A Moderate amount of bleeding was controlled with Pressure. The procedure was tolerated well with a pain level of 3 throughout and a pain level of 0 following the procedure. Post Debridement Measurements: 8cm length x 5.7cm width x 4.1cm depth; 146.838cm^3 volume. Post debridement Stage noted as Category/Stage IV. Character of Wound/Ulcer Post Debridement is improved. Post procedure Diagnosis Wound #1: Same as Pre-Procedure Plan Follow-up Appointments: Return Appointment in 2 weeks. - ***[facility] - ROOM 5*** Dressing Change Frequency: Wound #1 Right Gluteus: Change dressing every day. Skin Barriers/Peri-Wound Care: Barrier cream Wound Cleansing: Wound #1 Right Gluteus: Clean wound with Wound Cleanser - or normal saline Primary Wound Dressing: Wound #1 Right Gluteus: Calcium Alginate with Silver - fill space with gauze/kerlix Secondary Dressing: Wound #1 Right Gluteus: ABD pad - secure with tape Off-Loading: Low air-loss mattress (Group 2) Turn and reposition every 2 hours Laboratory ordered were: Anerobic culture - non healing wound right glut, Biopsy specimen culture - non healing right glut 1. Continue with silver alginate packed with dry gauze 2. Bone for pathology and CandS. If this is negative I think we can trial a wound VAC 3. If it is positive she will need a  prolonged course of antibiotics based on the culture 4. I cannot rule out having to do additional imaging studies although she is very difficult to transfer. She very well could require an CT scan or an MRI 5. With regards to her albumin 2.7 I have suggested careful attention to nutritional intake overall as well as twice daily protein supplements. If this drops further it may well preclude any hope of healing this wound Electronic Signature(s) Signed: 11/10/2019 10:06:32 AM By: Linton Ham MD Entered By: Linton Ham on 11/10/2019 10:06:32 -------------------------------------------------------------------------------- SuperBill Details Patient Name: Date of Service: LEAANN, NEVILS 11/10/2019 Medical Record [id number] Patient Account Number: [id number] Date of  Birth/Sex: Treating RN: 02-20-1946 (74 y.o. [name] Primary Care Provider: SYSTEM, PCP Other Clinician: Referring Provider: Treating Provider/Extender:Estie Sproule, Lamar Sprinkles in Treatment: 4 Diagnosis Coding ICD-10 Codes Code Description L89.314 Pressure ulcer of right buttock, stage 4 E44.0 Moderate protein-calorie malnutrition Facility Procedures CPT4 Code: [id number] Description: 11044 - DEB BONE 20 SQ CM/< ICD-10 Diagnosis Description L89.314 Pressure ulcer of right buttock, stage 4 Modifier: Quantity: 1 Physician Procedures CPT4 Description: Code [id number] Debridement; bone (includes epidermis, dermis, subQ tissue, muscle and/or fascia, if performed) 1st 20 sqcm or less ICD-10 Diagnosis Description L89.314 Pressure ulcer of right buttock, stage 4 Modifier: Quantity: 1 Electronic Signature(s) Signed: 11/16/2019 7:44:34 AM By: Baltazar Najjar MD Entered By: Baltazar Najjar on 11/10/2019 10:06:49

## 2019-11-18 ENCOUNTER — Other Ambulatory Visit: Payer: Self-pay | Admitting: Internal Medicine

## 2019-11-18 DIAGNOSIS — L89314 Pressure ulcer of right buttock, stage 4: Secondary | ICD-10-CM

## 2019-11-24 ENCOUNTER — Other Ambulatory Visit: Payer: Self-pay

## 2019-11-24 ENCOUNTER — Encounter (HOSPITAL_BASED_OUTPATIENT_CLINIC_OR_DEPARTMENT_OTHER): Payer: Medicare Other | Attending: Internal Medicine | Admitting: Internal Medicine

## 2019-11-24 DIAGNOSIS — I4891 Unspecified atrial fibrillation: Secondary | ICD-10-CM | POA: Diagnosis not present

## 2019-11-24 DIAGNOSIS — Z7901 Long term (current) use of anticoagulants: Secondary | ICD-10-CM | POA: Diagnosis not present

## 2019-11-24 DIAGNOSIS — I10 Essential (primary) hypertension: Secondary | ICD-10-CM | POA: Insufficient documentation

## 2019-11-24 DIAGNOSIS — E11622 Type 2 diabetes mellitus with other skin ulcer: Secondary | ICD-10-CM | POA: Diagnosis present

## 2019-11-24 DIAGNOSIS — E440 Moderate protein-calorie malnutrition: Secondary | ICD-10-CM | POA: Diagnosis not present

## 2019-11-24 DIAGNOSIS — L89314 Pressure ulcer of right buttock, stage 4: Secondary | ICD-10-CM | POA: Diagnosis not present

## 2019-11-24 NOTE — Progress Notes (Signed)
HAVEN, PYLANT ([id number]) Visit Report for 11/24/2019 HPI Details Patient Name: Date of Service: Becky Hendricks, Becky Hendricks 11/24/2019 8:45 AM Medical Record [id number] Patient Account Number: [id number] Date of Birth/Sex: Treating RN: 1946-06-28 (74 y.o. Arta Silence Primary Care Provider: SYSTEM, PCP Other Clinician: Referring Provider: Treating Provider/Extender:Myla Mauriello, Lamar Sprinkles in Treatment: 6 History of Present Illness HPI Description: ADMISSION 10/12/2019 This is a 74 year old woman whose problem began sometime in December 2020 when she was found on the floor in her condo in [place]. She was admitted to [facility] apparently dehydrated with some form of "intestinal infection". She was discharged to [facility] healthcare in [place] and sometime over the next several weeks that she was there she developed pressure ulcers on her buttock. Her brother who accompanies her today says that he was not aware of this still the patient was admitted to [facility] for Covid pneumonia from 1/25 through 2/9. At that point the treatment was Santyl and then wet-to-dry dressings. Also notable on 2/5 she had an albumin of 2.5 and a C-reactive protein of 1.7. The patient is now at [facility] skilled facility. I am not exactly sure of her functional status although her brother said she can stand and pivot transfer to a wheelchair but cannot walk. Past medical history; atrial fibrillation on Eliquis, bipolar affective disorder, recent diagnosis of type 2 diabetes with a hemoglobin A1c of 9.5 pseudomembranous colitis and hypertension. Currently the patient has a very large stage IV wound in close proximity to the gluteal cleft. Copious necrotic material and odor. She has a smaller superficial stage III wound on the left buttock. I am unclear what they are using at the nursing home currently 3/16; second visit for this patient with a very large stage IV wound  over her right buttock in close proximity to the gluteal cleft. Culture I did a purulent drainage last time showed MRSA. I put her on doxycycline. There was supposed to do pelvic x-rays I have not seen these results. We are using silver alginate with backing gauze 3/30; in general her wound looks somewhat better although there is still exposed bone. This is in the right buttock with close proximity to the gluteal cleft. She completed the doxycycline I put on last time. Pelvic x-rays did not show any evidence of osteomyelitis. Her lab work comprehensive metabolic panel showed a glucose of 249. Otherwise this was normal except for an albumin of 2.7. White count was 7.7 hemoglobin 10.7. Sedimentation rate was 107 and C-reactive protein at 18.28. 4/13; patient's wound has come in in terms of volume although there is still exposed bone. Bone cultures that I did last time showed Streptococcus anginosis and coag negative staph. After reviewing that I did not think I could ignore it although both of these were rare. There were no anaerobes isolated. I ordered 2 weeks worth of Rocephin. After researching it in terms of Rocephin is the treatment of choice for Streptococcus anginosis. I also ordered an MRI in view of the fact her sedimentation rate is 107 and C-reactive protein elevated at 18.28. The patient arrives in clinic really unaware whether she is getting an intramuscular injection. We cannot tell whether the MRI has been ordered certainly not obvious in epic. We are using silver alginate to the wound bed with covering dry gauze Electronic Signature(s) Signed: 11/24/2019 5:51:00 PM By: Baltazar Najjar MD Entered By: Baltazar Najjar on 11/24/2019 09:51:08 -------------------------------------------------------------------------------- Physical Exam Details Patient Name: Date of Service: Becky Hendricks 11/24/2019  8:45 AM Medical Record [id number] Patient Account Number: [id number] Date of  Birth/Sex: Treating RN: 09-21-1945 (74 y.o. Arta Silence Primary Care Provider: SYSTEM, PCP Other Clinician: Referring Provider: Treating Provider/Extender:Alzada Brazee, Lamar Sprinkles in Treatment: 6 Constitutional Patient is hypertensive.. Pulse regular and within target range for patient.Marland Kitchen Respirations regular, non-labored and within target range.. Temperature is normal and within the target range for the patient.Marland Kitchen Appears in no distress. Respiratory work of breathing is normal. Cardiovascular She does not look dehydrated.. Gastrointestinal (GI) Abdomen is soft and non-distended without masses or tenderness.. Genitourinary (GU) Bladder is not distended. Notes Wound exam; this is a large stage IV wound on the right upper buttock. This actually still probes to bone. It does look a lot better than when I first saw this 100% necrotic surface. There is no obvious surrounding soft tissue infection. Electronic Signature(s) Signed: 11/24/2019 5:51:00 PM By: Baltazar Najjar MD Entered By: Baltazar Najjar on 11/24/2019 09:52:13 -------------------------------------------------------------------------------- Physician Orders Details Patient Name: Date of Service: Becky Hendricks, Becky Hendricks 11/24/2019 8:45 AM Medical Record [id number] Patient Account Number: [id number] Date of Birth/Sex: Treating RN: [birth year] (74 y.o. Arta Silence Primary Care Provider: SYSTEM, PCP Other Clinician: Referring Provider: Treating Provider/Extender:Rut Betterton, Lamar Sprinkles in Treatment: 6 Verbal / Phone Orders: No Diagnosis Coding ICD-10 Coding Code Description L89.314 Pressure ulcer of right buttock, stage 4 E44.0 Moderate protein-calorie malnutrition Follow-up Appointments Return Appointment in 2 weeks. - ***[facility] - ROOM 5*** Dressing Change Frequency Wound #1 Right Gluteus Change dressing every day. Skin Barriers/Peri-Wound Care Barrier cream Wound Cleansing Wound #1 Right Gluteus Clean wound with  Wound Cleanser - or normal saline Primary Wound Dressing Wound #1 Right Gluteus Calcium Alginate with Silver - fill space with gauze/kerlix Secondary Dressing Wound #1 Right Gluteus ABD pad - secure with tape Off-Loading Low air-loss mattress (Group 2) Turn and reposition every 2 hours Additional Orders / Instructions Other: - ***Facility nurse to please call wound center to verify patient received the orders for MRI and IM antibiotics.*** Electronic Signature(s) Signed: 11/24/2019 5:51:00 PM By: Baltazar Najjar MD Signed: 11/24/2019 6:03:05 PM By: Shawn Stall Entered By: Shawn Stall on 11/24/2019 09:40:16 -------------------------------------------------------------------------------- Problem List Details Patient Name: Date of Service: Becky Hendricks, Becky Hendricks 11/24/2019 8:45 AM Medical Record [id number] Patient Account Number: [id number] Date of Birth/Sex: Treating RN: 10-03-1945 (74 y.o. 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 10/12/2019 No Yes Inactive Problems ICD-10 Code Description Active Date Inactive Date L89.323 Pressure ulcer of left buttock, stage 3 10/12/2019 10/12/2019 Resolved Problems Electronic Signature(s) Signed: 11/24/2019 5:51:00 PM By: Baltazar Najjar MD Entered By: Baltazar Najjar on 11/24/2019 09:48:28 -------------------------------------------------------------------------------- Progress Note Details Patient Name: Date of Service: Becky Hendricks, Becky Hendricks 11/24/2019 8:45 AM Medical Record [id number] Patient Account Number: [id number] Date of Birth/Sex: Treating RN: [birth year] (74 y.o. Arta Silence Primary Care Provider: SYSTEM, PCP Other  Clinician: Referring Provider: Treating Provider/Extender:Taren Toops, Lamar Sprinkles in Treatment: 6 Subjective History of Present Illness (HPI) ADMISSION 10/12/2019 This is a 73 year old woman whose problem began sometime in December 2020 when she was found on the floor in her condo in [place]. She was admitted to [facility] apparently dehydrated with some form of "intestinal infection". She was discharged to [facility] healthcare in [place] and sometime over the next several weeks that she was there she developed pressure ulcers on  her buttock. Her brother who accompanies her today says that he was not aware of this still the patient was admitted to [place] for Covid pneumonia from 1/25 through 2/9. At that point the treatment was Santyl and then wet-to-dry dressings. Also notable on 2/5 she had an albumin of 2.5 and a C-reactive protein of 1.7. The patient is now at [facility] skilled facility. I am not exactly sure of her functional status although her brother said she can stand and pivot transfer to a wheelchair but cannot walk. Past medical history; atrial fibrillation on Eliquis, bipolar affective disorder, recent diagnosis of type 2 diabetes with a hemoglobin A1c of 9.5 pseudomembranous colitis and hypertension. Currently the patient has a very large stage IV wound in close proximity to the gluteal cleft. Copious necrotic material and odor. She has a smaller superficial stage III wound on the left buttock. I am unclear what they are using at the nursing home currently 3/16; second visit for this patient with a very large stage IV wound over her right buttock in close proximity to the gluteal cleft. Culture I did a purulent drainage last time showed MRSA. I put her on doxycycline. There was supposed to do pelvic x-rays I have not seen these results. We are using silver alginate with backing gauze 3/30; in general her wound looks somewhat better  although there is still exposed bone. This is in the right buttock with close proximity to the gluteal cleft. She completed the doxycycline I put on last time. Pelvic x-rays did not show any evidence of osteomyelitis. Her lab work comprehensive metabolic panel showed a glucose of 249. Otherwise this was normal except for an albumin of 2.7. White count was 7.7 hemoglobin 10.7. Sedimentation rate was 107 and C-reactive protein at 18.28. 4/13; patient's wound has come in in terms of volume although there is still exposed bone. Bone cultures that I did last time showed Streptococcus anginosis and coag negative staph. After reviewing that I did not think I could ignore it although both of these were rare. There were no anaerobes isolated. I ordered 2 weeks worth of Rocephin. After researching it in terms of Rocephin is the treatment of choice for Streptococcus anginosis. I also ordered an MRI in view of the fact her sedimentation rate is 107 and C-reactive protein elevated at 18.28. The patient arrives in clinic really unaware whether she is getting an intramuscular injection. We cannot tell whether the MRI has been ordered certainly not obvious in epic. We are using silver alginate to the wound bed with covering dry gauze Objective Constitutional Patient is hypertensive.. Pulse regular and within target range for patient.Marland Kitchen Respirations regular, non-labored and within target range.. Temperature is normal and within the target range for the patient.Marland Kitchen Appears in no distress. Vitals Time Taken: 9:10 AM, Height: 66 in, Weight: 224 lbs, BMI: 36.2, Temperature: 98.6 F, Pulse: 105 bpm, Respiratory Rate: 16 breaths/min, Blood Pressure: 141/92 mmHg. Respiratory work of breathing is normal. Cardiovascular She does not look dehydrated.. Gastrointestinal (GI) Abdomen is soft and non-distended without masses or tenderness.. Genitourinary (GU) Bladder is not distended. General Notes: Wound exam; this is a  large stage IV wound on the right upper buttock. This actually still probes to bone. It does look a lot better than when I first saw this 100% necrotic surface. There is no obvious surrounding soft tissue infection. Integumentary (Hair, Skin) Wound #1 status is Open. Original cause of wound was Gradually Appeared. The wound  is located on the Right Gluteus. The wound measures 7cm length x 5cm width x 3.4cm depth; 27.489cm^2 area and 93.462cm^3 volume. There is bone and Fat Layer (Subcutaneous Tissue) Exposed exposed. There is no tunneling noted, however, there is undermining starting at 9:00 and ending at 1:00 with a maximum distance of 3.8cm. There is a large amount of serosanguineous drainage noted. The wound margin is well defined and not attached to the wound base. There is medium (34-66%) red, pink granulation within the wound bed. There is a medium (34-66%) amount of necrotic tissue within the wound bed including Adherent Slough. Assessment Active Problems ICD-10 Pressure ulcer of right buttock, stage 4 Moderate protein-calorie malnutrition Plan Follow-up Appointments: Return Appointment in 2 weeks. - ***[facility] - ROOM 5*** Dressing Change Frequency: Wound #1 Right Gluteus: Change dressing every day. Skin Barriers/Peri-Wound Care: Barrier cream Wound Cleansing: Wound #1 Right Gluteus: Clean wound with Wound Cleanser - or normal saline Primary Wound Dressing: Wound #1 Right Gluteus: Calcium Alginate with Silver - fill space with gauze/kerlix Secondary Dressing: Wound #1 Right Gluteus: ABD pad - secure with tape Off-Loading: Low air-loss mattress (Group 2) Turn and reposition every 2 hours Additional Orders / Instructions: Other: - ***Facility nurse to please call wound center to verify patient received the orders for MRI and IM antibiotics.*** 1. Although the wound looks quite a bit better I am unclear whether they actually started her on Rocephin or whether they got the  order for the Rocephin and the MRI. I know our nurse worked on this last week but we just cannot verify this. 2. I would like to give her a least a month of antibiotics and then change her to a wound VAC but I am not exactly sure she was started on anything. 3. Follow-up in 2 weeks. Our case manager is going to try to contact the facility to clarify Electronic Signature(s) Signed: 11/24/2019 5:51:00 PM By: Baltazar Najjar MD Entered By: Baltazar Najjar on 11/24/2019 09:53:21 -------------------------------------------------------------------------------- SuperBill Details Patient Name: Date of Service: Becky Hendricks, Becky Hendricks 11/24/2019 Medical Record [id number] Patient Account Number: [id number] Date of Birth/Sex: Treating RN: 13-Aug-1946 (74 y.o. 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

## 2019-11-25 NOTE — Progress Notes (Signed)
Becky, Hendricks ([id number]) Visit Report for 11/10/2019 Arrival Information Details Patient Name: Date of Service: Becky Hendricks, Becky Hendricks 11/10/2019 8:45 AM Medical Record [id number] Patient Account Number: [id number] Date of Birth/Sex: Treating RN: 1946-05-20 (74 y.o. Becky Hendricks Primary Care Florabelle Cardin: SYSTEM, PCP Other Clinician: Referring Yousra Ivens: Treating Brinley Treanor/Extender:Robson, Lamar Sprinkles in Treatment: 4 Visit Information History Since Last Visit Added or deleted any medications: No Patient Arrived: Wheel Chair Any new allergies or adverse reactions: No Arrival Time: 09:29 Had a fall or experienced change in No Accompanied By: brother activities of daily living that may affect Transfer Assistance: Michiel Sites Lift risk of falls: Patient Identification Verified: Yes Signs or symptoms of abuse/neglect since last No Secondary Verification Process Yes visito Completed: Hospitalized since last visit: No Patient Has Alerts: Yes Implantable device outside of the clinic excluding No Patient Alerts: Patient on Blood cellular tissue based products placed in the center Thinner since last visit: Has Dressing in Place as Prescribed: Yes Pain Present Now: Yes Electronic Signature(s) Signed: 11/25/2019 9:21:08 AM By: Karl Ito Entered By: Karl Ito on 11/10/2019 09:30:12 -------------------------------------------------------------------------------- Encounter Discharge Information Details Patient Name: Date of Service: Becky, Hendricks 11/10/2019 8:45 AM Medical Record [id number] Patient Account Number: [id number] Date of Birth/Sex: Treating RN: [birth year] (74 y.o. Harvest Dark Primary Care Asmaa Tirpak: SYSTEM, PCP Other Clinician: Referring Louvenia Golomb: Treating Artemisia Auvil/Extender:Robson, Lamar Sprinkles in Treatment: 4 Encounter Discharge Information Items Post Procedure Vitals Discharge Condition: Stable Temperature (F): 98.8 Ambulatory Status:  Wheelchair Pulse (bpm): 88 Discharge Destination: Skilled Nursing Facility Respiratory Rate (breaths/min): 20 Telephoned: No Blood Pressure (mmHg): 149/64 Orders Sent: Yes Transportation: Other Accompanied By: family member Schedule Follow-up Appointment: Yes Clinical Summary of Care: Patient Declined Electronic Signature(s) Signed: 11/16/2019 4:55:48 PM By: Cherylin Mylar Entered By: Cherylin Mylar on 11/10/2019 10:17:01 -------------------------------------------------------------------------------- Multi Wound Chart Details Patient Name: Date of Service: Becky, Hendricks 11/10/2019 8:45 AM Medical Record [id number] Patient Account Number: [id number] Date of Birth/Sex: Treating RN: [birth year] (74 y.o. Becky Hendricks Primary Care Katrinka Herbison: SYSTEM, PCP Other Clinician: Referring Ubah Radke: Treating Donovon Micheletti/Extender:Robson, Lamar Sprinkles in Treatment: 4 Vital Signs Height(in): 66 Pulse(bpm): 88 Weight(lbs): 224 Blood Pressure(mmHg): 149/64 Body Mass Index(BMI): 36 Temperature(F): 98.8 Respiratory 20 Rate(breaths/min): Photos: [1:No Photos] [2:No Photos] [N/A:N/A] Wound Location: [1:Right Gluteus] [2:Left Gluteus] [N/A:N/A] Wounding Event: [1:Gradually Appeared] [2:Gradually Appeared] [N/A:N/A] Primary Etiology: [1:Pressure Ulcer] [2:Pressure Ulcer] [N/A:N/A] Comorbid History: [1:Hypertension, Type II Diabetes] [2:Hypertension, Type II Diabetes] [N/A:N/A] Date Acquired: [1:08/14/2019] [2:09/11/2019] [N/A:N/A] Weeks of Treatment: [1:4] [2:4] [N/A:N/A] Wound Status: [1:Open] [2:Healed - Epithelialized] [N/A:N/A] Measurements L x W x D 8x5.7x4.1 [2:0x0x0] [N/A:N/A] (cm) Area (cm) : [1:35.814] [2:0] [N/A:N/A] Volume (cm) : [1:146.838] [2:0] [N/A:N/A] % Reduction in Area: [1:29.20%] [2:100.00%] [N/A:N/A] % Reduction in Volume: 65.80% [2:100.00%] [N/A:N/A] Classification: [1:Category/Stage IV] [2:Category/Stage III] [N/A:N/A] Exudate Amount: [1:Large] [2:None  Present] [N/A:N/A] Exudate Type: [1:Serosanguineous] [2:N/A] [N/A:N/A] Exudate Color: [1:red, brown] [2:N/A] [N/A:N/A] Foul Odor After Cleansing:Yes [2:No] [N/A:N/A] Odor Anticipated Due to No [2:N/A] [N/A:N/A] Product Use: Wound Margin: [1:Well defined, not attached] [2:Flat and Intact] Granulation Amount: [1:Medium (34-66%)] [2:None Present (0%)] Granulation Quality: [1:Red, Pink] [2:N/A] Necrotic Amount: [1:Medium (34-66%)] [2:None Present (0%)] Exposed Structures: [1:Fat Layer (Subcutaneous Tissue) Exposed: Yes Muscle: Yes Fascia: No Tendon: No Joint: No Bone: No None] [2:Fascia: No Fat Layer (Subcutaneous Tissue) Exposed: No Tendon: No Muscle: No Joint: No Bone: No Large (67-100%)] Treatment Notes Electronic Signature(s) Signed: 11/11/2019 5:40:01 PM By: Yevonne Pax RN Signed: 11/16/2019 7:44:34 AM By: Baltazar Najjar MD Entered By: Baltazar Najjar on 11/10/2019 09:58:12 -------------------------------------------------------------------------------- Multi-Disciplinary Care Plan Details  Patient Name: Date of Service: Becky, Hendricks 11/10/2019 8:45 AM Medical Record [id number] Patient Account Number: [id number] Date of Birth/Sex: Treating RN: [birth year] (74 y.o. Becky Hendricks Primary Care Kosta Schnitzler: SYSTEM, PCP Other Clinician: Referring Hadasa Gasner: Treating Shantal Roan/Extender:Robson, Lamar Sprinkles in Treatment: 4 Active Inactive Abuse / Safety / Falls / Self Care Management Nursing Diagnoses: Potential for falls Potential for injury related to falls Goals: Patient will not experience any injury related to falls Date Initiated: 10/12/2019 Target Resolution Date: 11/13/2019 Goal Status: Active Patient/caregiver will verbalize/demonstrate measure taken to improve self care Date Initiated: 10/12/2019 Target Resolution Date: 11/13/2019 Goal Status: Active Patient/caregiver will verbalize/demonstrate measures taken to improve the patient's personal safety Date Initiated:  10/12/2019 Target Resolution Date: 11/13/2019 Goal Status: Active Interventions: Assess Activities of Daily Living upon admission and as needed Assess fall risk on admission and as needed Assess: immobility, friction, shearing, incontinence upon admission and as needed Assess impairment of mobility on admission and as needed per policy Assess personal safety and home safety (as indicated) on admission and as needed Assess self care needs on admission and as needed Provide education on fall prevention Provide education on personal and home safety Notes: Nutrition Nursing Diagnoses: Imbalanced nutrition Impaired glucose control: actual or potential Potential for alteratiion in Nutrition/Potential for imbalanced nutrition Goals: Patient/caregiver agrees to and verbalizes understanding of need to use nutritional supplements and/or vitamins as prescribed Date Initiated: 10/12/2019 Target Resolution Date: 11/13/2019 Goal Status: Active Patient/caregiver will maintain therapeutic glucose control Date Initiated: 10/12/2019 Target Resolution Date: 11/13/2019 Goal Status: Active Interventions: Assess HgA1c results as ordered upon admission and as needed Assess patient nutrition upon admission and as needed per policy Provide education on elevated blood sugars and impact on wound healing Provide education on nutrition Treatment Activities: Education provided on Nutrition : 10/12/2019 Notes: Pressure Nursing Diagnoses: Knowledge deficit related to causes and risk factors for pressure ulcer development Knowledge deficit related to management of pressures ulcers Potential for impaired tissue integrity related to pressure, friction, moisture, and shear Goals: Patient/caregiver will verbalize risk factors for pressure ulcer development Date Initiated: 10/12/2019 Target Resolution Date: 11/13/2019 Goal Status: Active Patient/caregiver will verbalize understanding of pressure ulcer management Date  Initiated: 10/12/2019 Target Resolution Date: 11/13/2019 Goal Status: Active Interventions: Assess: immobility, friction, shearing, incontinence upon admission and as needed Assess offloading mechanisms upon admission and as needed Assess potential for pressure ulcer upon admission and as needed Provide education on pressure ulcers Notes: Wound/Skin Impairment Nursing Diagnoses: Impaired tissue integrity Knowledge deficit related to ulceration/compromised skin integrity Goals: Patient/caregiver will verbalize understanding of skin care regimen Date Initiated: 10/12/2019 Target Resolution Date: 11/13/2019 Goal Status: Active Interventions: Assess patient/caregiver ability to obtain necessary supplies Assess patient/caregiver ability to perform ulcer/skin care regimen upon admission and as needed Assess ulceration(s) every visit Provide education on ulcer and skin care Notes: Electronic Signature(s) Signed: 11/11/2019 5:40:01 PM By: Yevonne Pax RN Entered By: Yevonne Pax on 11/10/2019 09:14:28 -------------------------------------------------------------------------------- Pain Assessment Details Patient Name: Date of Service: CHERI, AYOTTE 11/10/2019 8:45 AM Medical Record [id number] Patient Account Number: [id number] Date of Birth/Sex: Treating RN: 1945/10/10 (74 y.o. Becky Hendricks Primary Care Hazelynn Mckenny: SYSTEM, PCP Other Clinician: Referring Katera Rybka: Treating Camani Sesay/Extender:Robson, Lamar Sprinkles in Treatment: 4 Active Problems Location of Pain Severity and Description of Pain Patient Has Paino Yes Site Locations Rate the pain. Current Pain Level: 5 Pain Management and Medication Current Pain Management: Electronic Signature(s) Signed: 11/11/2019 5:40:01 PM By: Yevonne Pax RN Signed: 11/25/2019 9:21:08 AM By: Karl Ito Entered By: Karl Ito  on 11/10/2019  09:30:56 -------------------------------------------------------------------------------- Patient/Caregiver Education Details Patient Name: Date of Service: TIERRA, THOMA 3/30/2021andnbsp8:45 AM Medical Record [id number] Patient Account Number: [id number] Date of Birth/Gender: Treating RN: 04-Mar-1946 (74 y.o. Becky Hendricks Primary Care Physician: SYSTEM, PCP Other Clinician: Referring Physician: Treating Physician/Extender:Robson, Lamar Sprinkles in Treatment: 4 Education Assessment Education Provided To: Patient Education Topics Provided Nutrition: Methods: Explain/Verbal Responses: State content correctly Pressure: Methods: Explain/Verbal Responses: State content correctly Electronic Signature(s) Signed: 11/11/2019 5:40:01 PM By: Yevonne Pax RN Entered By: Yevonne Pax on 11/10/2019 09:14:50 -------------------------------------------------------------------------------- Wound Assessment Details Patient Name: Date of Service: PATRESE, NEAL 11/10/2019 8:45 AM Medical Record [id number] Patient Account Number: [id number] Date of Birth/Sex: Treating RN: [birth year] (74 y.o. Becky Hendricks Primary Care Moustapha Tooker: SYSTEM, PCP Other Clinician: Referring Oscar Hank: Treating Jariyah Hackley/Extender:Robson, Lamar Sprinkles in Treatment: 4 Wound Status Wound Number: 1 Primary Etiology: Pressure Ulcer Wound Location: Right Gluteus Wound Status: Open Wounding Event: Gradually Appeared Comorbid History: Hypertension, Type II Diabetes Date Acquired: 08/14/2019 Weeks Of Treatment: 4 Clustered Wound: No Photos Photo Uploaded By: Benjaman Kindler on 11/13/2019 15:43:59 Wound Measurements Length: (cm) 8 % Reduction Width: (cm) 5.7 % Reduction Depth: (cm) 4.1 Epithelializ Area: (cm) 35.814 Volume: (cm) 146.838 Wound Description Classification: Category/Stage IV Foul Odor A Wound Margin: Well defined, not attached Due to Prod Exudate Amount: Large Slough/Fibr Exudate Type:  Serosanguineous Exudate Color: red, brown Wound Bed Granulation Amount: Medium (34-66%) Granulation Quality: Red, Pink Fascia Expo Necrotic Amount: Medium (34-66%) Fat Layer ( Necrotic Quality: Adherent Slough Tendon Expo Muscle Expo Necros Joint Expos Bone Expose Electronic Signature(s) Signed: 11/11/2019 5:40:01 PM By: Yevonne Pax RN Entered By: Yevonne Pax on 03/30/2 fter Cleansing: Yes uct Use: No ino Yes Exposed Structure sed: No Subcutaneous Tissue) Exposed: Yes sed: No sed: Yes is of Muscle: Yes ed: No d: No 021 09:57:27 in Area: 29.2% in Volume: 65.8% ation: None -------------------------------------------------------------------------------- Wound Assessment Details Patient Name: Date of Service: RUTHEL, MARTINE 11/10/2019 8:45 AM Medical Record [id number] Patient Account Number: [id number] Date of Birth/Sex: Treating RN: September 03, 1945 (74 y.o. Becky Hendricks Primary Care Mallisa Alameda: SYSTEM, PCP Other Clinician: Referring Grettell Ransdell: Treating Yeudiel Mateo/Extender:Robson, Lamar Sprinkles in Treatment: 4 Wound Status Wound Number: 2 Primary Etiology: Pressure Ulcer Wound Location: Left Gluteus Wound Status: Healed - Epithelialized Wounding Event: Gradually Appeared Comorbid History: Hypertension, Type II Diabetes Date Acquired: 09/11/2019 Weeks Of Treatment: 4 Clustered Wound: No Photos Photo Uploaded By: Benjaman Kindler on 11/13/2019 15:44:00 Wound Measurements Length: (cm) 0 % Reductio Width: (cm) 0 % Reductio Depth: (cm) 0 Epithelial Area: (cm) 0 Tunneling Volume: (cm) 0 Undermini Wound Description Classification: Category/Stage III Foul Odor Wound Margin: Flat and Intact Slough/Fi Exudate Amount: None Present Wound Bed Granulation Amount: None Present (0%) Necrotic Amount: None Present (0%) Fascia Ex Fat Layer Tendon Ex Muscle Ex Joint Exp Bone Expose After Cleansing: No brino No Exposed Structure posed: No (Subcutaneous Tissue) Exposed:  No posed: No posed: No osed: No d: No n in Area: 100% n in Volume: 100% ization: Large (67-100%) : No ng: No Electronic Signature(s) Signed: 11/11/2019 5:40:01 PM By: Yevonne Pax RN Entered By: Yevonne Pax on 11/10/2019 09:57:27 -------------------------------------------------------------------------------- Vitals Details Patient Name: Date of Service: ALVA, KUENZEL 11/10/2019 8:45 AM Medical Record [id number] Patient Account Number: [id number] Date of Birth/Sex: Treating RN: 1946/03/26 (74 y.o. Becky Hendricks Primary Care Lang Zingg: SYSTEM, PCP Other Clinician: Referring Teriyah Purington: Treating Evalie Hargraves/Extender:Robson, Lamar Sprinkles in Treatment: 4 Vital Signs Time Taken: 09:30 Temperature (F): 98.8 Height (in): 66 Pulse (bpm): 88 Weight (lbs): 224 Respiratory  Rate (breaths/min): 20 Body Mass Index (BMI): 36.2 Blood Pressure (mmHg): 149/64 Reference Range: 80 - 120 mg / dl Electronic Signature(s) Signed: 11/25/2019 9:21:08 AM By: Sandre Kitty Entered By: Sandre Kitty on 11/10/2019 09:30:29

## 2019-11-25 NOTE — Progress Notes (Signed)
Becky Hendricks ([id number]) Visit Report for 10/27/2019 Arrival Information Details Patient Name: Date of Service: Becky Hendricks 10/27/2019 8:45 AM Medical Record [id number] Patient Account Number: [id number] Date of Birth/Sex: Treating RN: [birth year] (74 y.o. [name] Primary Care Kedra Mcglade: SYSTEM, PCP Other Clinician: Referring Dondi Burandt: Treating Damonique Brunelle/Extender:Robson, Lamar Sprinkles in Treatment: 2 Visit Information History Since Last Visit Added or deleted any medications: No Patient Arrived: Wheel Chair Any new allergies or adverse reactions: No Arrival Time: 09:27 Had a fall or experienced change in No Accompanied By: brother activities of daily living that may affect Transfer Assistance: Michiel Sites Lift risk of falls: Patient Identification Verified: Yes Signs or symptoms of abuse/neglect since last No Secondary Verification Process Yes visito Completed: Hospitalized since last visit: No Patient Has Alerts: Yes Implantable device outside of the clinic excluding No Patient Alerts: Patient on Blood cellular tissue based products placed in the center Thinner since last visit: Has Dressing in Place as Prescribed: Yes Pain Present Now: Yes Electronic Signature(s) Signed: 11/25/2019 9:21:08 AM By: Karl Ito Entered By: Karl Ito on 10/27/2019 09:28:12 -------------------------------------------------------------------------------- Encounter Discharge Information Details Patient Name: Date of Service: Becky Hendricks 10/27/2019 8:45 AM Medical Record [id number] Patient Account Number: [id number] Date of Birth/Sex: Treating RN: [birth year] (74 y.o. Harvest Dark Primary Care Buryl Bamber: SYSTEM, PCP Other Clinician: Referring Tres Grzywacz: Treating Aiken Withem/Extender:Robson, Lamar Sprinkles in Treatment: 2 Encounter Discharge Information Items Post Procedure Vitals Discharge Condition: Stable Temperature (F): 98.1 Ambulatory Status:  Wheelchair Pulse (bpm): 89 Discharge Destination: Home Respiratory Rate (breaths/min): 20 Transportation: Other Blood Pressure (mmHg): 136/56 Accompanied By: brother Schedule Follow-up Appointment: Yes Clinical Summary of Care: Patient Declined Electronic Signature(s) Signed: 10/27/2019 5:07:08 PM By: Cherylin Mylar Entered By: Cherylin Mylar on 10/27/2019 11:20:45 -------------------------------------------------------------------------------- Multi Wound Chart Details Patient Name: Date of Service: Becky Hendricks 10/27/2019 8:45 AM Medical Record [id number] Patient Account Number: [id number] Date of Birth/Sex: Treating RN: 1945/08/29 (74 y.o. [name] Primary Care Thomasene Dubow: SYSTEM, PCP Other Clinician: Referring Shron Ozer: Treating Linkyn Gobin/Extender:Robson, Lamar Sprinkles in Treatment: 2 Vital Signs Height(in): 66 Pulse(bpm): 89 Weight(lbs): 224 Blood Pressure(mmHg): 136/56 Body Mass Index(BMI): 36 Temperature(F): 98.1 Respiratory 20 Rate(breaths/min): Photos: [1:No Photos] [2:No Photos] [N/A:N/A] Wound Location: [1:Right Gluteus] [2:Left Gluteus] [N/A:N/A] Wounding Event: [1:Gradually Appeared] [2:Gradually Appeared] [N/A:N/A] Primary Etiology: [1:Pressure Ulcer] [2:Pressure Ulcer] [N/A:N/A] Comorbid History: [1:Hypertension, Type II Diabetes] [2:Hypertension, Type II Diabetes] [N/A:N/A] Date Acquired: [1:08/14/2019] [2:09/11/2019] [N/A:N/A] Weeks of Treatment: [1:2] [2:2] [N/A:N/A] Wound Status: [1:Open] [2:Open] [N/A:N/A] Measurements L x W x D 8.7x6x5.1 [2:0x0x0] [N/A:N/A] (cm) Area (cm) : [1:40.998] [2:0] [N/A:N/A] Volume (cm) : [1:209.089] [2:0] [N/A:N/A] % Reduction in Area: [1:18.90%] [2:100.00%] [N/A:N/A] % Reduction in Volume: 51.40% [2:100.00%] [N/A:N/A] Starting Position 1 11 (o'clock): Ending Position 1 [1:2] (o'clock): Maximum Distance 1 [1:6.4] (cm): Undermining: [1:Yes] [2:No] [N/A:N/A] Classification: [1:Category/Stage IV]  [2:Category/Stage III] [N/A:N/A] Exudate Amount: [1:Large] [2:None Present] [N/A:N/A] Exudate Type: [1:Serosanguineous] [2:N/A] [N/A:N/A] Exudate Color: [1:red, brown] [2:N/A] [N/A:N/A] Foul Odor After Cleansing:Yes [2:No] [N/A:N/A] Odor Anticipated Due to No [2:N/A] [N/A:N/A] Product Use: Wound Margin: [1:Well defined, not attached] [2:Flat and Intact] [N/A:N/A] Granulation Amount: [1:Medium (34-66%)] [2:None Present (0%)] [N/A:N/A] Granulation Quality: [1:Red, Pink] [2:N/A] [N/A:N/A] Necrotic Amount: [1:Medium (34-66%)] [2:None Present (0%)] [N/A:N/A] Exposed Structures: [1:Fat Layer (Subcutaneous Tissue) Exposed: Yes Muscle: Yes Fascia: No Tendon: No Joint: No Bone: No] [2:Fascia: No Fat Layer (Subcutaneous Tissue) Exposed: No Tendon: No Muscle: No Joint: No Bone: No] [N/A:N/A] Epithelialization: [1:None] [2:Large (67-100%)] [N/A:N/A] Debridement: [1:Debridement - Excisional] [2:N/A] [N/A:N/A] Pre-procedure [1:10:02] [2:N/A] [N/A:N/A] Verification/Time Out Taken: Pain Control: [1:Lidocaine  5% topical ointment] [2:N/A] [N/A:N/A] Tissue Debrided: [1:Subcutaneous, Slough] [2:N/A] [N/A:N/A] Level: [1:Skin/Subcutaneous Tissue] [2:N/A] [N/A:N/A] Debridement Area (sq cm):52.2 [2:N/A] [N/A:N/A] Instrument: [1:Blade, Forceps] [2:N/A] [N/A:N/A] Bleeding: [1:Large] [2:N/A] [N/A:N/A] Hemostasis Achieved: [1:Silver Nitrate] [2:N/A] [N/A:N/A] Procedural Pain: [1:4] [2:N/A] [N/A:N/A] Post Procedural Pain: [1:2] [2:N/A] [N/A:N/A] Debridement Treatment Procedure was tolerated [2:N/A] [N/A:N/A] Response: [1:well] Post Debridement [1:8.7x6x5.1] [2:N/A] [N/A:N/A] Measurements L x W x D (cm) Post Debridement [1:209.089] [2:N/A] [N/A:N/A] Volume: (cm) Post Debridement Stage: Category/Stage IV [2:N/A N/A] [N/A:N/A N/A] Treatment Notes Electronic Signature(s) Signed: 10/27/2019 5:29:05 PM By: Baltazar Najjar MD Signed: 10/28/2019 8:57:53 AM By: Yevonne Pax RN Entered By: Baltazar Najjar on  10/27/2019 10:25:43 -------------------------------------------------------------------------------- Multi-Disciplinary Care Plan Details Patient Name: Date of Service: Becky Hendricks 10/27/2019 8:45 AM Medical Record [id number] Patient Account Number: [id number] Date of Birth/Sex: Treating RN: 28-Sep-1945 (74 y.o. [name] Primary Care Rudell Ortman: SYSTEM, PCP Other Clinician: Referring Delta Pichon: Treating Linley Moskal/Extender:Robson, Lamar Sprinkles in Treatment: 2 Active Inactive Abuse / Safety / Falls / Self Care Management Nursing Diagnoses: Potential for falls Potential for injury related to falls Goals: Patient will not experience any injury related to falls Date Initiated: 10/12/2019 Target Resolution Date: 11/13/2019 Goal Status: Active Patient/caregiver will verbalize/demonstrate measure taken to improve self care Date Initiated: 10/12/2019 Target Resolution Date: 11/13/2019 Goal Status: Active Patient/caregiver will verbalize/demonstrate measures taken to improve the patient's personal safety Date Initiated: 10/12/2019 Target Resolution Date: 11/13/2019 Goal Status: Active Interventions: Assess Activities of Daily Living upon admission and as needed Assess fall risk on admission and as needed Assess: immobility, friction, shearing, incontinence upon admission and as needed Assess impairment of mobility on admission and as needed per policy Assess personal safety and home safety (as indicated) on admission and as needed Assess self care needs on admission and as needed Provide education on fall prevention Provide education on personal and home safety Notes: Nutrition Nursing Diagnoses: Imbalanced nutrition Impaired glucose control: actual or potential Potential for alteratiion in Nutrition/Potential for imbalanced nutrition Goals: Patient/caregiver agrees to and verbalizes understanding of need to use nutritional supplements and/or vitamins as prescribed Date  Initiated: 10/12/2019 Target Resolution Date: 11/13/2019 Goal Status: Active Patient/caregiver will maintain therapeutic glucose control Date Initiated: 10/12/2019 Target Resolution Date: 11/13/2019 Goal Status: Active Interventions: Assess HgA1c results as ordered upon admission and as needed Assess patient nutrition upon admission and as needed per policy Provide education on elevated blood sugars and impact on wound healing Provide education on nutrition Treatment Activities: Education provided on Nutrition : 10/12/2019 Notes: Pressure Nursing Diagnoses: Knowledge deficit related to causes and risk factors for pressure ulcer development Knowledge deficit related to management of pressures ulcers Potential for impaired tissue integrity related to pressure, friction, moisture, and shear Goals: Patient/caregiver will verbalize risk factors for pressure ulcer development Date Initiated: 10/12/2019 Target Resolution Date: 11/13/2019 Goal Status: Active Patient/caregiver will verbalize understanding of pressure ulcer management Date Initiated: 10/12/2019 Target Resolution Date: 11/13/2019 Goal Status: Active Interventions: Assess: immobility, friction, shearing, incontinence upon admission and as needed Assess offloading mechanisms upon admission and as needed Assess potential for pressure ulcer upon admission and as needed Provide education on pressure ulcers Notes: Wound/Skin Impairment Nursing Diagnoses: Impaired tissue integrity Knowledge deficit related to ulceration/compromised skin integrity Goals: Patient/caregiver will verbalize understanding of skin care regimen Date Initiated: 10/12/2019 Target Resolution Date: 11/13/2019 Goal Status: Active Interventions: Assess patient/caregiver ability to obtain necessary supplies Assess patient/caregiver ability to perform ulcer/skin care regimen upon admission and as needed Assess ulceration(s) every visit Provide education on ulcer and skin  care Notes: Electronic Signature(s) Signed: 10/28/2019 8:57:53 AM By: Yevonne Pax RN Entered By: Yevonne Pax on 10/27/2019 09:13:17 -------------------------------------------------------------------------------- Pain Assessment Details Patient Name: Date of Service: PHOUA, Becky Hendricks 10/27/2019 8:45 AM Medical Record [id number] Patient Account Number: [id number] Date of Birth/Sex: Treating RN: 11-02-1945 (74 y.o. [name] Primary Care Taeshawn Helfman: SYSTEM, PCP Other Clinician: Referring Lexy Meininger: Treating Detrick Dani/Extender:Robson, Lamar Sprinkles in Treatment: 2 Active Problems Location of Pain Severity and Description of Pain Patient Has Paino Yes Site Locations Rate the pain. Current Pain Level: 5 Pain Management and Medication Current Pain Management: Electronic Signature(s) Signed: 10/28/2019 8:57:53 AM By: Yevonne Pax RN Signed: 11/25/2019 9:21:08 AM By: Karl Ito Entered By: Karl Ito on 10/27/2019 09:28:36 -------------------------------------------------------------------------------- Patient/Caregiver Education Details Patient Name: Date of Service: NADALIE, LAUGHNER 3/16/2021andnbsp8:45 AM Medical Record [id number] Patient Account Number: [id number] Date of Birth/Gender: Treating RN: 1946/04/22 (74 y.o. [name] Primary Care Physician: SYSTEM, PCP Other Clinician: Referring Physician: Treating Physician/Extender:Robson, Lamar Sprinkles in Treatment: 2 Education Assessment Education Provided To: Patient Education Topics Provided Pressure: Methods: Explain/Verbal Responses: State content correctly Safety: Methods: Explain/Verbal Responses: State content correctly Electronic Signature(s) Signed: 10/28/2019 8:57:53 AM By: Yevonne Pax RN Entered By: Yevonne Pax on 10/27/2019 09:13:38 -------------------------------------------------------------------------------- Wound Assessment Details Patient Name: Date of Service: PRESLEIGH, FELDSTEIN 10/27/2019 8:45 AM Medical Record [id number] Patient Account Number: [id number] Date of Birth/Sex: Treating RN: 1946/04/02 (74 y.o. Tommye Standard Primary Care Rich Paprocki: SYSTEM, PCP Other Clinician: Referring Kiesha Ensey: Treating Janecia Palau/Extender:Robson, Lamar Sprinkles in Treatment: 2 Wound Status Wound Number: 1 Primary Etiology: Pressure Ulcer Wound Location: Right Gluteus Wound Status: Open Wounding Event: Gradually Appeared Comorbid History: Hypertension, Type II Diabetes Date Acquired: 08/14/2019 Weeks Of Treatment: 2 Clustered Wound: No Photos Photo Uploaded By: Benjaman Kindler on 10/28/2019 14:10:45 Wound Measurements Length: (cm) 8.7 Width: (cm) 6 Depth: (cm) 5.1 Area: (cm) 40.998 Volume: (cm) 209.089 % Reduction in Area: 18.9% % Reduction in Volume: 51.4% Epithelialization: None Tunneling: No Undermining: Yes Starting Position (o'clock): 11 Ending Position (o'clock): 2 Maximum Distance: (cm) 6.4 Wound Description Classification: Category/Stage IV Foul Odor Wound Margin: Well defined, not attached Due to Pro Exudate Amount: Large Slough/Fib Exudate Type: Serosanguineous Exudate Color: red, brown Wound Bed Granulation Amount: Medium (34-66%) Granulation Quality: Red, Pink Fascia Exp Necrotic Amount: Medium (34-66%) Fat Layer Necrotic Quality: Adherent Slough Tendon Exp Muscle Exp Necro Joint Expo Bone Expos After Cleansing: Yes duct Use: No rino Yes Exposed Structure osed: No (Subcutaneous Tissue) Exposed: Yes osed: No osed: Yes sis of Muscle: Yes sed: No ed: No Electronic Signature(s) Signed: 10/27/2019 5:21:02 PM By: Zenaida Deed RN, BSN Entered By: Zenaida Deed on 10/27/2019 09:49:50 -------------------------------------------------------------------------------- Wound Assessment Details Patient Name: Date of Service: EYVA, CALIFANO 10/27/2019 8:45 AM Medical Record [id number] Patient Account Number: [id number] Date  of Birth/Sex: Treating RN: Jul 15, 1946 (74 y.o. Tommye Standard Primary Care Aranza Geddes: SYSTEM, PCP Other Clinician: Referring Patric Buckhalter: Treating Ermias Tomeo/Extender:Robson, Lamar Sprinkles in Treatment: 2 Wound Status Wound Number: 2 Primary Etiology: Pressure Ulcer Wound Location: Left Gluteus Wound Status: Open Wounding Event: Gradually Appeared Comorbid History: Hypertension, Type II Diabetes Date Acquired: 09/11/2019 Weeks Of Treatment: 2 Clustered Wound: No Photos Photo Uploaded By: Benjaman Kindler on 10/28/2019 14:10:46 Wound Measurements Length: (cm) 0 % Reduction Width: (cm) 0 % Reduction Depth: (cm) 0 Epitheliali Area: (cm) 0 Tunneling: Volume: (cm) 0 Underminin Wound Description Classification: Category/Stage III Foul Odor Wound Margin: Flat and Intact Slough/Fib Exudate Amount: None Present Wound Bed Granulation Amount: None Present (0%) Necrotic Amount: None Present (0%) Fascia Exp  Fat Layer Tendon Exp Muscle Exp Joint Expo Bone Expos After Cleansing: No rino No Exposed Structure osed: No (Subcutaneous Tissue) Exposed: No osed: No osed: No sed: No ed: No in Area: 100% in Volume: 100% zation: Large (67-100%) No g: No Electronic Signature(s) Signed: 10/27/2019 5:21:02 PM By: Baruch Gouty RN, BSN Entered By: Baruch Gouty on 10/27/2019 09:50:10 -------------------------------------------------------------------------------- [place] Details Patient Name: Date of Service: MATILYNN, DACEY 10/27/2019 8:45 AM Medical Record [id number] Patient Account Number: [id number] Date of Birth/Sex: Treating RN: 09/18/1945 (74 y.o. Orvan Falconer Primary Care Merve Hotard: SYSTEM, PCP Other Clinician: Referring Shanetha Bradham: Treating Rebeka Kimble/Extender:Robson, Esperanza Richters in Treatment: 2 Vital Signs Time Taken: 09:28 Temperature (F): 98.1 Height (in): 66 Pulse (bpm): 89 Weight (lbs): 224 Respiratory Rate (breaths/min): 20 Body Mass Index (BMI):  36.2 Blood Pressure (mmHg): 136/56 Reference Range: 80 - 120 mg / dl Electronic Signature(s) Signed: 11/25/2019 9:21:08 AM By: Sandre Kitty Entered By: Sandre Kitty on 10/27/2019 34:19:62

## 2019-11-25 NOTE — Progress Notes (Signed)
Becky Hendricks, Becky Hendricks ([id number]) Visit Report for 11/24/2019 Arrival Information Details Patient Name: Date of Service: Becky Hendricks, Becky Hendricks 11/24/2019 8:45 AM Medical Record [id number] Patient Account Number: [id number] Date of Birth/Sex: Treating RN: 1945-11-14 (74 y.o. Becky Hendricks Primary Care Rajiv Parlato: SYSTEM, PCP Other Clinician: Referring Philopater Mucha: Treating Waynette Towers/Extender:Robson, Esperanza Richters in Treatment: 6 Visit Information History Since Last Visit Added or deleted any medications: No Patient Arrived: Wheel Chair Any new allergies or adverse reactions: No Arrival Time: 09:08 Had a fall or experienced change in No Accompanied By: brother activities of daily living that may affect Transfer Assistance: Becky Hendricks Lift risk of falls: Patient Identification Verified: Yes Signs or symptoms of abuse/neglect since last No Secondary Verification Process Yes visito Completed: Hospitalized since last visit: No Patient Requires Transmission- No Implantable device outside of the clinic excluding No Based Precautions: cellular tissue based products placed in the center Patient Has Alerts: Yes since last visit: Patient Alerts: Patient on Blood Has Dressing in Place as Prescribed: Yes Thinner Pain Present Now: No Electronic Signature(s) Signed: 11/25/2019 5:43:43 PM By: Levan Hurst RN, BSN Entered By: Levan Hurst on 11/24/2019 09:24:26 -------------------------------------------------------------------------------- Clinic Level of Care Assessment Details Patient Name: Date of Service: Becky Hendricks 11/24/2019 8:45 AM Medical Record [id number] Patient Account Number: [id number] Date of Birth/Sex: Treating RN: Nov 16, 1945 (74 y.o. Becky Hendricks Primary Care Miria Cappelli: SYSTEM, PCP Other Clinician: Referring Kazue Cerro: Treating Zinnia Tindall/Extender:Robson, Esperanza Richters in Treatment: 6 Clinic Level of Care Assessment Items TOOL 4 Quantity Score X - Use when only an  EandM is performed on FOLLOW-UP visit 1 0 ASSESSMENTS - Nursing Assessment / Reassessment X - Reassessment of Co-morbidities (includes updates in patient status) 1 10 X - Reassessment of Adherence to Treatment Plan 1 5 ASSESSMENTS - Wound and Skin Assessment / Reassessment '[]'  - Simple Wound Assessment / Reassessment - one wound 0 X - Complex Wound Assessment / Reassessment - multiple wounds 1 5 X - Dermatologic / Skin Assessment (not related to wound area) 1 10 ASSESSMENTS - Focused Assessment '[]'  - Circumferential Edema Measurements - multi extremities 0 X - Nutritional Assessment / Counseling / Intervention 1 10 '[]'  - Lower Extremity Assessment (monofilament, tuning fork, pulses) 0 '[]'  - Peripheral Arterial Disease Assessment (using hand held doppler) 0 ASSESSMENTS - Ostomy and/or Continence Assessment and Care '[]'  - Incontinence Assessment and Management 0 '[]'  - Ostomy Care Assessment and Management (repouching, etc.) 0 PROCESS - Coordination of Care '[]'  - Simple Patient / Family Education for ongoing care 0 X - Complex (extensive) Patient / Family Education for ongoing care 1 20 X - Staff obtains Programmer, systems, Records, Test Results / Process Orders 1 10 X - Staff telephones HHA, Nursing Homes / Clarify orders / etc 1 10 '[]'  - Routine Transfer to another Facility (non-emergent condition) 0 '[]'  - Routine Hospital Admission (non-emergent condition) 0 '[]'  - New Admissions / Biomedical engineer / Ordering NPWT, Apligraf, etc. 0 '[]'  - Emergency Hospital Admission (emergent condition) 0 '[]'  - Simple Discharge Coordination 0 X - Complex (extensive) Discharge Coordination 1 15 PROCESS - Special Needs '[]'  - Pediatric / Minor Patient Management 0 '[]'  - Isolation Patient Management 0 '[]'  - Hearing / Language / Visual special needs 0 '[]'  - Assessment of Community assistance (transportation, D/C planning, etc.) 0 '[]'  - Additional assistance / Altered mentation 0 '[]'  - Support Surface(s) Assessment (bed,  cushion, seat, etc.) 0 INTERVENTIONS - Wound Cleansing / Measurement '[]'  - Simple Wound Cleansing - one wound 0 X - Complex Wound Cleansing - multiple wounds 1  5 X - Wound Imaging (photographs - any number of wounds) 1 5 '[]'  - Wound Tracing (instead of photographs) 0 '[]'  - Simple Wound Measurement - one wound 0 X - Complex Wound Measurement - multiple wounds 1 5 INTERVENTIONS - Wound Dressings '[]'  - Small Wound Dressing one or multiple wounds 0 X - Medium Wound Dressing one or multiple wounds 1 15 '[]'  - Large Wound Dressing one or multiple wounds 0 '[]'  - Application of Medications - topical 0 '[]'  - Application of Medications - injection 0 INTERVENTIONS - Miscellaneous '[]'  - External ear exam 0 '[]'  - Specimen Collection (cultures, biopsies, blood, body fluids, etc.) 0 '[]'  - Specimen(s) / Culture(s) sent or taken to Lab for analysis 0 '[]'  - Patient Transfer (multiple staff / Civil Service fast streamer / Similar devices) 0 '[]'  - Simple Staple / Suture removal (25 or less) 0 '[]'  - Complex Staple / Suture removal (26 or more) 0 '[]'  - Hypo / Hyperglycemic Management (close monitor of Blood Glucose) 0 '[]'  - Ankle / Brachial Index (ABI) - do not check if billed separately 0 X - Vital Signs 1 5 Has the patient been seen at the hospital within the last three years: Yes Total Score: 130 Level Of Care: New/Established - Level 4 Electronic Signature(s) Signed: 11/24/2019 6:03:05 PM By: Deon Pilling Entered By: Deon Pilling on 11/24/2019 09:41:32 -------------------------------------------------------------------------------- Encounter Discharge Information Details Patient Name: Date of Service: Becky Hendricks, Becky Hendricks 11/24/2019 8:45 AM Medical Record [id number] Patient Account Number: [id number] Date of Birth/Sex: Treating RN: 04/06/1946 (74 y.o. Becky Hendricks Primary Care Claborn Janusz: SYSTEM, PCP Other Clinician: Referring Anzal Bartnick: Treating Asjia Berrios/Extender:Robson, Esperanza Richters in Treatment: 6 Encounter Discharge  Information Items Discharge Condition: Stable Ambulatory Status: Wheelchair Discharge Destination: Skilled Nursing Facility Telephoned: No Orders Sent: Yes Transportation: Other Accompanied By: brother Schedule Follow-up Appointment: Yes Clinical Summary of Care: Patient Declined Electronic Signature(s) Signed: 11/24/2019 5:52:37 PM By: Kela Millin Entered By: Kela Millin on 11/24/2019 10:03:30 -------------------------------------------------------------------------------- Multi Wound Chart Details Patient Name: Date of Service: Becky Hendricks, Becky Hendricks 11/24/2019 8:45 AM Medical Record [id number] Patient Account Number: [id number] Date of Birth/Sex: Treating RN: 07-20-1946 (74 y.o. Becky Hendricks Primary Care Analynn Daum: SYSTEM, PCP Other Clinician: Referring Blaike Newburn: Treating Jayme Cham/Extender:Robson, Esperanza Richters in Treatment: 6 Vital Signs Height(in): 66 Pulse(bpm): 105 Weight(lbs): 387 Blood Pressure(mmHg): 141/92 Body Mass Index(BMI): 36 Temperature(F): 98.6 Respiratory 16 Rate(breaths/min): Photos: [1:No Photos] [N/A:N/A] Wound Location: [1:Right Gluteus] [N/A:N/A] Wounding Event: [1:Gradually Appeared] [N/A:N/A] Primary Etiology: [1:Pressure Ulcer] [N/A:N/A] Comorbid History: [1:Hypertension, Type II Diabetes] [N/A:N/A] Date Acquired: [1:08/14/2019] [N/A:N/A] Weeks of Treatment: [1:6] [N/A:N/A] Wound Status: [1:Open] [N/A:N/A] Measurements L x W x D 7x5x3.4 [N/A:N/A] (cm) Area (cm) : [1:27.489] [N/A:N/A] Volume (cm) : [1:93.462] [N/A:N/A] % Reduction in Area: [1:45.70%] [N/A:N/A] % Reduction in Volume: 78.30% [N/A:N/A] Starting Position 1 9 (o'clock): Ending Position 1 [1:1] (o'clock): Maximum Distance 1 [1:3.8] (cm): Undermining: [1:Yes] [N/A:N/A] Classification: [1:Category/Stage IV] [N/A:N/A] Exudate Amount: [1:Large] [N/A:N/A] Exudate Type: [1:Serosanguineous] [N/A:N/A] Exudate Color: [1:red, brown] [N/A:N/A] Wound Margin: [1:Well  defined, not attached] [N/A:N/A] Granulation Amount: [1:Medium (34-66%)] [N/A:N/A] Granulation Quality: [1:Red, Pink] [N/A:N/A] Necrotic Amount: [1:Medium (34-66%)] [N/A:N/A] Exposed Structures: [1:Fat Layer (Subcutaneous Tissue) Exposed: Yes Bone: Yes Fascia: No Tendon: No Muscle: No Joint: No Small (1-33%)] [N/A:N/A N/A] Treatment Notes Electronic Signature(s) Signed: 11/24/2019 5:51:00 PM By: Linton Ham MD Signed: 11/24/2019 6:03:05 PM By: Deon Pilling Entered By: Linton Ham on 11/24/2019 09:48:33 -------------------------------------------------------------------------------- Multi-Disciplinary Care Plan Details Patient Name: Date of Service: Becky Hendricks, Becky Hendricks 11/24/2019 8:45 AM Medical Record [id number] Patient Account Number: [id number]  Date of Birth/Sex: Treating RN: Aug 31, 1945 (74 y.o. Helene Shoe, Tammi Klippel Primary Care Keonia Pasko: SYSTEM, PCP Other Clinician: Referring Shene Maxfield: Treating Tedd Cottrill/Extender:Robson, Esperanza Richters in Treatment: 6 Active Inactive Abuse / Safety / Falls / Self Care Management Nursing Diagnoses: Potential for falls Potential for injury related to falls Goals: Patient will not experience any injury related to falls Date Initiated: 10/12/2019 Target Resolution Date: 01/08/2020 Goal Status: Active Patient/caregiver will verbalize/demonstrate measure taken to improve self care Date Initiated: 10/12/2019 Target Resolution Date: 01/08/2020 Goal Status: Active Patient/caregiver will verbalize/demonstrate measures taken to improve the patient's personal safety Date Initiated: 10/12/2019 Date Inactivated: 11/24/2019 Target Resolution Date: 11/24/2019 Goal Status: Met Interventions: Assess Activities of Daily Living upon admission and as needed Assess fall risk on admission and as needed Assess: immobility, friction, shearing, incontinence upon admission and as needed Assess impairment of mobility on admission and as needed per policy Assess personal  safety and home safety (as indicated) on admission and as needed Assess self care needs on admission and as needed Provide education on fall prevention Provide education on personal and home safety Notes: Nutrition Nursing Diagnoses: Imbalanced nutrition Impaired glucose control: actual or potential Potential for alteratiion in Nutrition/Potential for imbalanced nutrition Goals: Patient/caregiver agrees to and verbalizes understanding of need to use nutritional supplements and/or vitamins as prescribed Date Initiated: 10/12/2019 Date Inactivated: 11/24/2019 Target Resolution Date: 11/24/2019 Goal Status: Met Patient/caregiver will maintain therapeutic glucose control Date Initiated: 10/12/2019 Target Resolution Date: 01/08/2020 Goal Status: Active Interventions: Assess HgA1c results as ordered upon admission and as needed Assess patient nutrition upon admission and as needed per policy Provide education on elevated blood sugars and impact on wound healing Provide education on nutrition Treatment Activities: Education provided on Nutrition : 11/10/2019 Notes: Pressure Nursing Diagnoses: Knowledge deficit related to causes and risk factors for pressure ulcer development Knowledge deficit related to management of pressures ulcers Potential for impaired tissue integrity related to pressure, friction, moisture, and shear Goals: Patient/caregiver will verbalize risk factors for pressure ulcer development Date Initiated: 10/12/2019 Date Inactivated: 11/24/2019 Target Resolution Date: 11/24/2019 Goal Status: Met Patient/caregiver will verbalize understanding of pressure ulcer management Date Initiated: 10/12/2019 Target Resolution Date: 01/08/2020 Goal Status: Active Interventions: Assess: immobility, friction, shearing, incontinence upon admission and as needed Assess offloading mechanisms upon admission and as needed Assess potential for pressure ulcer upon admission and as needed Provide  education on pressure ulcers Notes: Electronic Signature(s) Signed: 11/24/2019 6:03:05 PM By: Deon Pilling Entered By: Deon Pilling on 11/24/2019 09:23:38 -------------------------------------------------------------------------------- Pain Assessment Details Patient Name: Date of Service: Becky Hendricks, Becky Hendricks 11/24/2019 8:45 AM Medical Record [id number] Patient Account Number: [id number] Date of Birth/Sex: Treating RN: Jun 22, 1946 (74 y.o. Becky Hendricks Primary Care Bobbye Petti: SYSTEM, PCP Other Clinician: Referring Chealsea Paske: Treating Kitana Gage/Extender:Robson, Esperanza Richters in Treatment: 6 Active Problems Location of Pain Severity and Description of Pain Patient Has Paino No Site Locations Pain Management and Medication Current Pain Management: Electronic Signature(s) Signed: 11/25/2019 5:43:43 PM By: Levan Hurst RN, BSN Entered By: Levan Hurst on 11/24/2019 09:24:53 -------------------------------------------------------------------------------- Patient/Caregiver Education Details Patient Name: Date of Service: Becky Hendricks 4/13/2021andnbsp8:45 AM Medical Record [id number] Patient Account Number: [id number] Date of Birth/Gender: Treating RN: 1946/07/12 (74 y.o. Becky Hendricks Primary Care Physician: SYSTEM, PCP Other Clinician: Referring Physician: Treating Physician/Extender:Robson, Esperanza Richters in Treatment: 6 Education Assessment Education Provided To: Patient Education Topics Provided Elevated Blood Sugar/ Impact on Healing: Handouts: Elevated Blood Sugars: How Do They Affect Wound Healing Methods: Explain/Verbal Responses: Reinforcements needed Electronic Signature(s) Signed: 11/24/2019 6:03:05 PM By: Rolin Barry,  Bobbi Entered By: Deon Pilling on 11/24/2019 09:24:05 -------------------------------------------------------------------------------- Wound Assessment Details Patient Name: Date of Service: Becky Hendricks, Becky Hendricks 11/24/2019 8:45 AM Medical  Record [id number] Patient Account Number: [id number] Date of Birth/Sex: Treating RN: [birth year] (74 y.o. Becky Hendricks Primary Care Jakiera Ehler: SYSTEM, PCP Other Clinician: Referring Lashun Mccants: Treating Mikhaela Zaugg/Extender:Robson, Esperanza Richters in Treatment: 6 Wound Status Wound Number: 1 Primary Etiology: Pressure Ulcer Wound Location: Right Gluteus Wound Status: Open Wounding Event: Gradually Appeared Comorbid History: Hypertension, Type II Diabetes Date Acquired: 08/14/2019 Weeks Of Treatment: 6 Clustered Wound: No Wound Measurements Length: (cm) 7 % Reduction in Ar Width: (cm) 5 % Reduction in Vo Depth: (cm) 3.4 Epithelialization Area: (cm) 27.489 Tunneling: Volume: (cm) 93.462 Undermining: Starting Posit Ending Positio Maximum Distan ea: 45.7% lume: 78.3% : Small (1-33%) No Yes ion (o'clock): 9 n (o'clock): 1 ce: (cm) 3.8 Wound Description Classification: Category/Stage IV Foul Odor After C Wound Margin: Well defined, not attached Slough/Fibrino Exudate Amount: Large Exudate Type: Serosanguineous Exudate Color: red, brown Wound Bed Granulation Amount: Medium (34-66%) Granulation Quality: Red, Pink Fascia Exposed: Necrotic Amount: Medium (34-66%) Fat Layer (Subcut Necrotic Quality: Adherent Slough Tendon Exposed: Muscle Exposed: Joint Exposed: Bone Exposed: leansing: No Yes Exposed Structure No aneous Tissue) Exposed: Yes No No No Yes Treatment Notes Wound #1 (Right Gluteus) 1. Cleanse With Wound Cleanser 2. Periwound Care Skin Prep 3. Primary Dressing Applied Calcium Alginate Ag 4. Secondary Dressing ABD Pad Dry Gauze 5. Secured With Recruitment consultant) Signed: 11/25/2019 5:43:43 PM By: Levan Hurst RN, BSN Entered By: Levan Hurst on 11/24/2019 09:23:37 -------------------------------------------------------------------------------- Vitals Details Patient Name: Date of Service: Becky Hendricks, Becky Hendricks 11/24/2019 8:45  AM Medical Record [id number] Patient Account Number: [id number] Date of Birth/Sex: Treating RN: [birth year] (74 y.o. Becky Hendricks Primary Care Elfrida Pixley: SYSTEM, PCP Other Clinician: Referring Blakley Michna: Treating Nyeemah Jennette/Extender:Robson, Esperanza Richters in Treatment: 6 Vital Signs Time Taken: 09:10 Temperature (F): 98.6 Height (in): 66 Pulse (bpm): 105 Weight (lbs): 224 Respiratory Rate (breaths/min): 16 Body Mass Index (BMI): 36.2 Blood Pressure (mmHg): 141/92 Reference Range: 80 - 120 mg / dl Electronic Signature(s) Signed: 11/25/2019 5:43:43 PM By: Levan Hurst RN, BSN Entered By: Levan Hurst on 11/24/2019 09:24:47

## 2019-12-03 ENCOUNTER — Inpatient Hospital Stay (HOSPITAL_COMMUNITY)
Admission: EM | Admit: 2019-12-03 | Discharge: 2019-12-17 | DRG: 853 | Disposition: A | Discharge status: Skilled Nursing Facility | Payer: Medicare Other | Source: Skilled Nursing Facility | Attending: Internal Medicine | Admitting: Internal Medicine

## 2019-12-03 ENCOUNTER — Emergency Department (HOSPITAL_COMMUNITY): Payer: Medicare Other

## 2019-12-03 ENCOUNTER — Inpatient Hospital Stay (HOSPITAL_COMMUNITY): Payer: Medicare Other

## 2019-12-03 ENCOUNTER — Other Ambulatory Visit: Payer: Self-pay

## 2019-12-03 ENCOUNTER — Encounter (HOSPITAL_COMMUNITY): Payer: Self-pay | Admitting: Emergency Medicine

## 2019-12-03 DIAGNOSIS — E1165 Type 2 diabetes mellitus with hyperglycemia: Secondary | ICD-10-CM | POA: Diagnosis not present

## 2019-12-03 DIAGNOSIS — I10 Essential (primary) hypertension: Secondary | ICD-10-CM | POA: Diagnosis not present

## 2019-12-03 DIAGNOSIS — Z7901 Long term (current) use of anticoagulants: Secondary | ICD-10-CM

## 2019-12-03 DIAGNOSIS — E1122 Type 2 diabetes mellitus with diabetic chronic kidney disease: Secondary | ICD-10-CM | POA: Diagnosis present

## 2019-12-03 DIAGNOSIS — S31000D Unspecified open wound of lower back and pelvis without penetration into retroperitoneum, subsequent encounter: Secondary | ICD-10-CM | POA: Diagnosis not present

## 2019-12-03 DIAGNOSIS — I129 Hypertensive chronic kidney disease with stage 1 through stage 4 chronic kidney disease, or unspecified chronic kidney disease: Secondary | ICD-10-CM | POA: Diagnosis present

## 2019-12-03 DIAGNOSIS — E1151 Type 2 diabetes mellitus with diabetic peripheral angiopathy without gangrene: Secondary | ICD-10-CM | POA: Diagnosis present

## 2019-12-03 DIAGNOSIS — E876 Hypokalemia: Secondary | ICD-10-CM | POA: Diagnosis not present

## 2019-12-03 DIAGNOSIS — Z87440 Personal history of urinary (tract) infections: Secondary | ICD-10-CM | POA: Diagnosis not present

## 2019-12-03 DIAGNOSIS — Z7401 Bed confinement status: Secondary | ICD-10-CM | POA: Diagnosis not present

## 2019-12-03 DIAGNOSIS — D631 Anemia in chronic kidney disease: Secondary | ICD-10-CM | POA: Diagnosis present

## 2019-12-03 DIAGNOSIS — Z79899 Other long term (current) drug therapy: Secondary | ICD-10-CM

## 2019-12-03 DIAGNOSIS — Z9981 Dependence on supplemental oxygen: Secondary | ICD-10-CM

## 2019-12-03 DIAGNOSIS — Z8719 Personal history of other diseases of the digestive system: Secondary | ICD-10-CM | POA: Diagnosis not present

## 2019-12-03 DIAGNOSIS — M869 Osteomyelitis, unspecified: Secondary | ICD-10-CM

## 2019-12-03 DIAGNOSIS — I480 Paroxysmal atrial fibrillation: Secondary | ICD-10-CM

## 2019-12-03 DIAGNOSIS — A419 Sepsis, unspecified organism: Secondary | ICD-10-CM | POA: Diagnosis present

## 2019-12-03 DIAGNOSIS — S31000A Unspecified open wound of lower back and pelvis without penetration into retroperitoneum, initial encounter: Secondary | ICD-10-CM | POA: Diagnosis not present

## 2019-12-03 DIAGNOSIS — R6520 Severe sepsis without septic shock: Secondary | ICD-10-CM | POA: Diagnosis present

## 2019-12-03 DIAGNOSIS — Z7984 Long term (current) use of oral hypoglycemic drugs: Secondary | ICD-10-CM

## 2019-12-03 DIAGNOSIS — G9341 Metabolic encephalopathy: Secondary | ICD-10-CM | POA: Diagnosis present

## 2019-12-03 DIAGNOSIS — Z8616 Personal history of COVID-19: Secondary | ICD-10-CM

## 2019-12-03 DIAGNOSIS — E872 Acidosis: Secondary | ICD-10-CM | POA: Diagnosis not present

## 2019-12-03 DIAGNOSIS — J9622 Acute and chronic respiratory failure with hypercapnia: Secondary | ICD-10-CM | POA: Diagnosis not present

## 2019-12-03 DIAGNOSIS — J9621 Acute and chronic respiratory failure with hypoxia: Secondary | ICD-10-CM | POA: Diagnosis not present

## 2019-12-03 DIAGNOSIS — R4182 Altered mental status, unspecified: Secondary | ICD-10-CM | POA: Diagnosis present

## 2019-12-03 DIAGNOSIS — R0682 Tachypnea, not elsewhere classified: Secondary | ICD-10-CM

## 2019-12-03 DIAGNOSIS — E669 Obesity, unspecified: Secondary | ICD-10-CM | POA: Diagnosis not present

## 2019-12-03 DIAGNOSIS — L89159 Pressure ulcer of sacral region, unspecified stage: Secondary | ICD-10-CM | POA: Diagnosis not present

## 2019-12-03 DIAGNOSIS — I361 Nonrheumatic tricuspid (valve) insufficiency: Secondary | ICD-10-CM | POA: Diagnosis not present

## 2019-12-03 DIAGNOSIS — L89304 Pressure ulcer of unspecified buttock, stage 4: Secondary | ICD-10-CM | POA: Diagnosis present

## 2019-12-03 DIAGNOSIS — E785 Hyperlipidemia, unspecified: Secondary | ICD-10-CM | POA: Diagnosis present

## 2019-12-03 DIAGNOSIS — M4628 Osteomyelitis of vertebra, sacral and sacrococcygeal region: Secondary | ICD-10-CM | POA: Diagnosis present

## 2019-12-03 DIAGNOSIS — L0231 Cutaneous abscess of buttock: Secondary | ICD-10-CM | POA: Diagnosis present

## 2019-12-03 DIAGNOSIS — R0902 Hypoxemia: Secondary | ICD-10-CM

## 2019-12-03 DIAGNOSIS — N182 Chronic kidney disease, stage 2 (mild): Secondary | ICD-10-CM | POA: Diagnosis present

## 2019-12-03 DIAGNOSIS — Z20822 Contact with and (suspected) exposure to covid-19: Secondary | ICD-10-CM | POA: Diagnosis present

## 2019-12-03 DIAGNOSIS — E119 Type 2 diabetes mellitus without complications: Secondary | ICD-10-CM | POA: Diagnosis not present

## 2019-12-03 DIAGNOSIS — E1169 Type 2 diabetes mellitus with other specified complication: Secondary | ICD-10-CM | POA: Diagnosis present

## 2019-12-03 DIAGNOSIS — F319 Bipolar disorder, unspecified: Secondary | ICD-10-CM | POA: Diagnosis present

## 2019-12-03 DIAGNOSIS — Z6839 Body mass index (BMI) 39.0-39.9, adult: Secondary | ICD-10-CM

## 2019-12-03 DIAGNOSIS — L89314 Pressure ulcer of right buttock, stage 4: Secondary | ICD-10-CM | POA: Diagnosis not present

## 2019-12-03 HISTORY — DX: Osteomyelitis, unspecified: M86.9

## 2019-12-03 HISTORY — DX: Severe sepsis without septic shock: A41.9

## 2019-12-03 HISTORY — DX: Sepsis, unspecified organism: R65.20

## 2019-12-03 LAB — CBC WITH DIFFERENTIAL/PLATELET
Abs Immature Granulocytes: 0.21 10*3/uL — ABNORMAL HIGH (ref 0.00–0.07)
Basophils Absolute: 0.1 10*3/uL (ref 0.0–0.1)
Basophils Relative: 1 %
Eosinophils Absolute: 0.1 10*3/uL (ref 0.0–0.5)
Eosinophils Relative: 1 %
HCT: 36.8 % (ref 36.0–46.0)
Hemoglobin: 10.4 g/dL — ABNORMAL LOW (ref 12.0–15.0)
Immature Granulocytes: 2 %
Lymphocytes Relative: 6 %
Lymphs Abs: 0.8 10*3/uL (ref 0.7–4.0)
MCH: 27.6 pg (ref 26.0–34.0)
MCHC: 28.3 g/dL — ABNORMAL LOW (ref 30.0–36.0)
MCV: 97.6 fL (ref 80.0–100.0)
Monocytes Absolute: 0.6 10*3/uL (ref 0.1–1.0)
Monocytes Relative: 5 %
Neutro Abs: 10.8 10*3/uL — ABNORMAL HIGH (ref 1.7–7.7)
Neutrophils Relative %: 85 %
Platelets: 323 10*3/uL (ref 150–400)
RBC: 3.77 MIL/uL — ABNORMAL LOW (ref 3.87–5.11)
RDW: 15.7 % — ABNORMAL HIGH (ref 11.5–15.5)
WBC: 12.6 10*3/uL — ABNORMAL HIGH (ref 4.0–10.5)
nRBC: 0.3 % — ABNORMAL HIGH (ref 0.0–0.2)

## 2019-12-03 LAB — COMPREHENSIVE METABOLIC PANEL
ALT: 43 U/L (ref 0–44)
AST: 45 U/L — ABNORMAL HIGH (ref 15–41)
Albumin: 1.5 g/dL — ABNORMAL LOW (ref 3.5–5.0)
Alkaline Phosphatase: 70 U/L (ref 38–126)
Anion gap: 10 (ref 5–15)
BUN: 13 mg/dL (ref 8–23)
CO2: 35 mmol/L — ABNORMAL HIGH (ref 22–32)
Calcium: 8.6 mg/dL — ABNORMAL LOW (ref 8.9–10.3)
Chloride: 94 mmol/L — ABNORMAL LOW (ref 98–111)
Creatinine, Ser: 0.47 mg/dL (ref 0.44–1.00)
GFR calc Af Amer: >60 mL/min (ref >60)
GFR calc non Af Amer: >60 mL/min (ref >60)
Glucose, Bld: 139 mg/dL — ABNORMAL HIGH (ref 70–99)
Potassium: 4.5 mmol/L (ref 3.5–5.1)
Sodium: 139 mmol/L (ref 135–145)
Total Bilirubin: 0.3 mg/dL (ref 0.3–1.2)
Total Protein: 6.6 g/dL (ref 6.5–8.1)

## 2019-12-03 LAB — URINALYSIS, ROUTINE W REFLEX MICROSCOPIC
Bilirubin Urine: NEGATIVE
Glucose, UA: NEGATIVE mg/dL
Hgb urine dipstick: NEGATIVE
Ketones, ur: 5 mg/dL — AB
Leukocytes,Ua: NEGATIVE
Nitrite: NEGATIVE
Protein, ur: NEGATIVE mg/dL
Specific Gravity, Urine: 1.017 (ref 1.005–1.030)
pH: 5 (ref 5.0–8.0)

## 2019-12-03 LAB — CBG MONITORING, ED: Glucose-Capillary: 125 mg/dL — ABNORMAL HIGH (ref 70–99)

## 2019-12-03 LAB — LACTIC ACID, PLASMA
Lactic Acid, Venous: 1.5 mmol/L (ref 0.5–1.9)
Lactic Acid, Venous: 1.5 mmol/L (ref 0.5–1.9)

## 2019-12-03 LAB — HEMOGLOBIN A1C
Hgb A1c MFr Bld: 7.8 % — ABNORMAL HIGH (ref 4.8–5.6)
Mean Plasma Glucose: 177.16 mg/dL

## 2019-12-03 LAB — APTT: aPTT: 35 seconds (ref 24–36)

## 2019-12-03 LAB — PROTIME-INR
INR: 1.7 — ABNORMAL HIGH (ref 0.8–1.2)
Prothrombin Time: 19.7 seconds — ABNORMAL HIGH (ref 11.4–15.2)

## 2019-12-03 IMAGING — CT CT HEAD W/O CM
4 series · 17 of 47 positions shown, 19 images · non-contrast
Comparison: [DATE]

CLINICAL DATA: Altered mental status

EXAM:
CT HEAD WITHOUT CONTRAST
TECHNIQUE: Contiguous axial images were obtained from the base of the skull
through the vertex without intravenous contrast.

[Series 2: head without · axial · non-contrast · 0.39mm/px · z∈[+1365,+1485]mm · 7 of 34 slices shown, 9 images]
[im 5/34  brain]
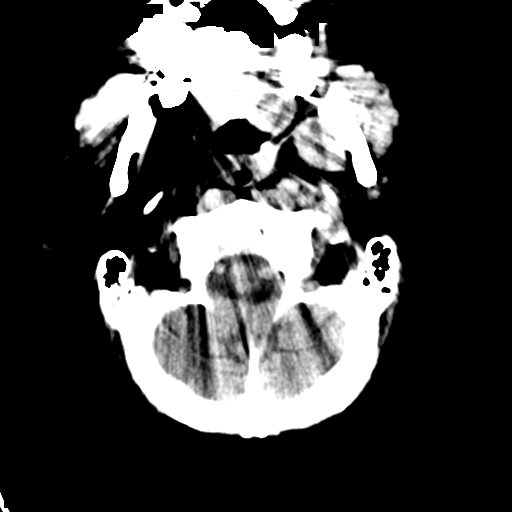
[im 5/34  bone]
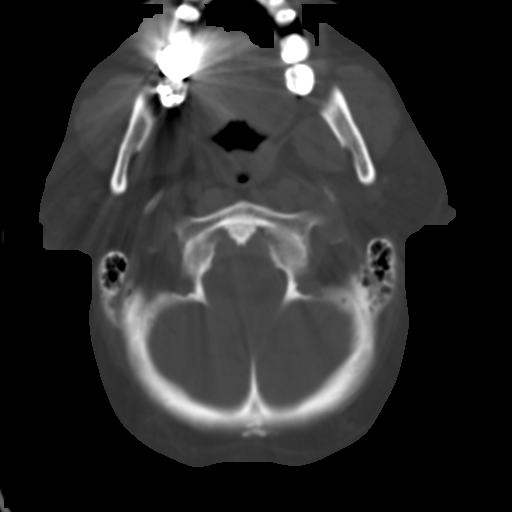
[im 9/34  brain]
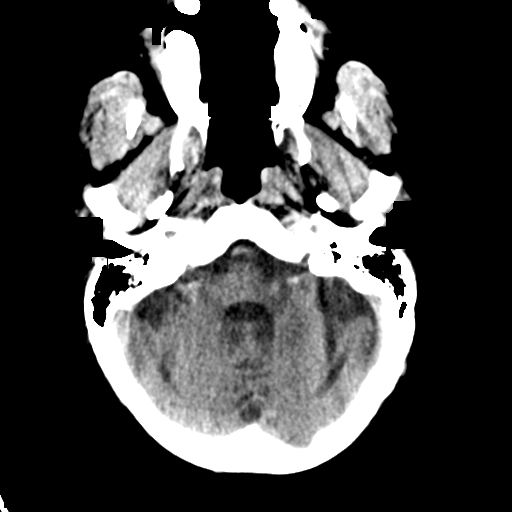
[im 13/34  brain]
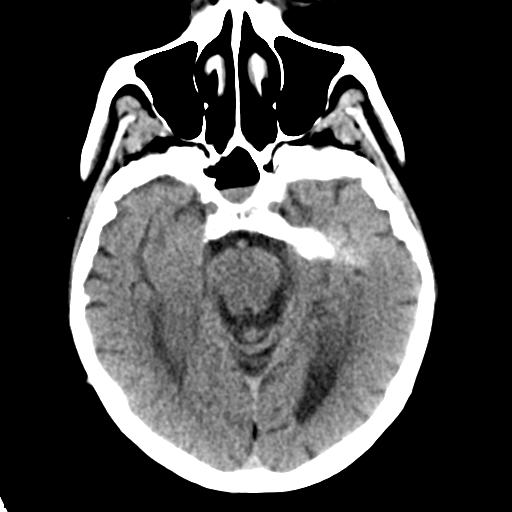
[im 17/34  brain]
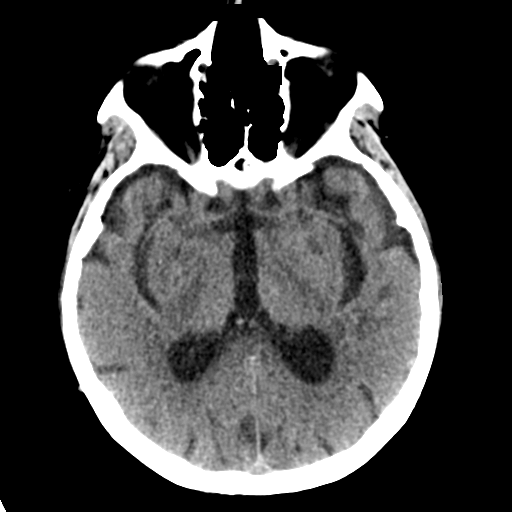
[im 21/34  brain]
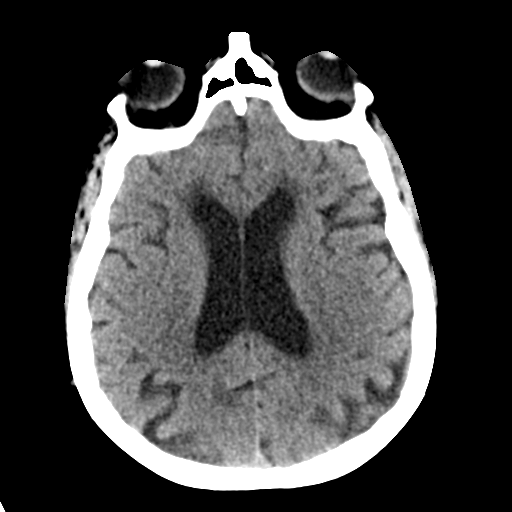
[im 21/34  bone]
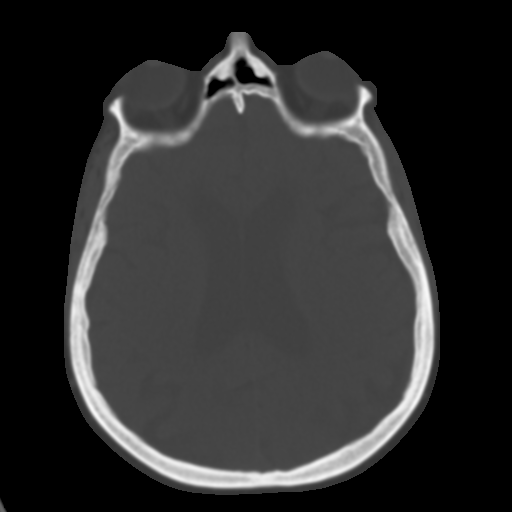
[im 25/34  brain]
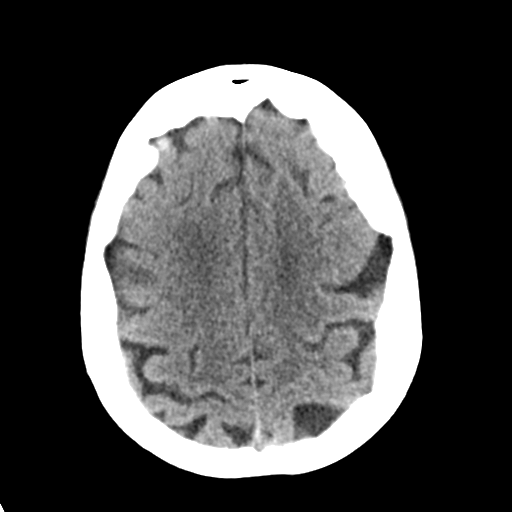
[im 29/34  brain]
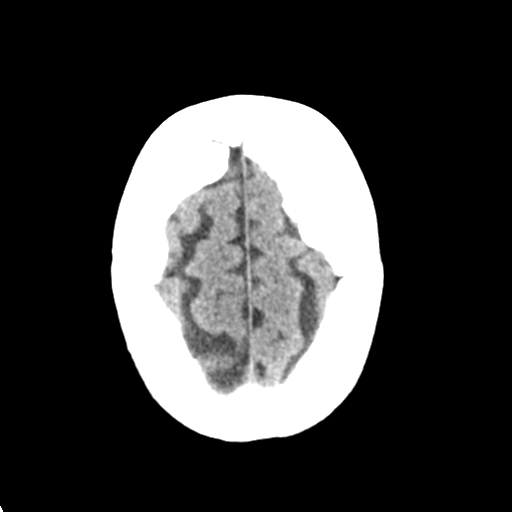

[Series 3: head bone · axial · 0.39mm/px · z∈[+1361,+1419]mm · 4 of 85 slices shown]
[im 9/85  bone]
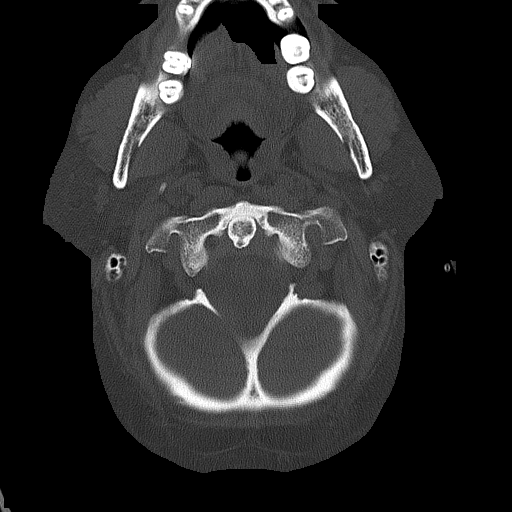
[im 17/85  bone]
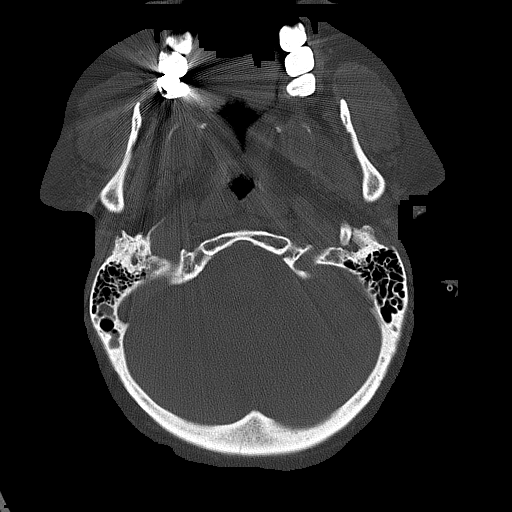
[im 26/85  bone]
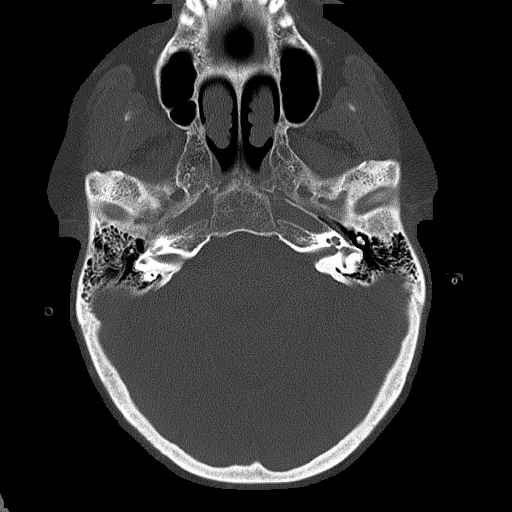
[im 38/85  bone]
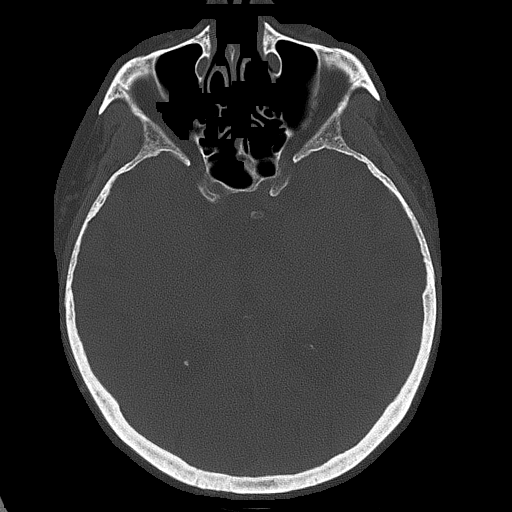

[Series 4: head without cor · coronal · non-contrast · 0.33mm/px · 3 of 67 slices shown]
[im 23/67  brain]
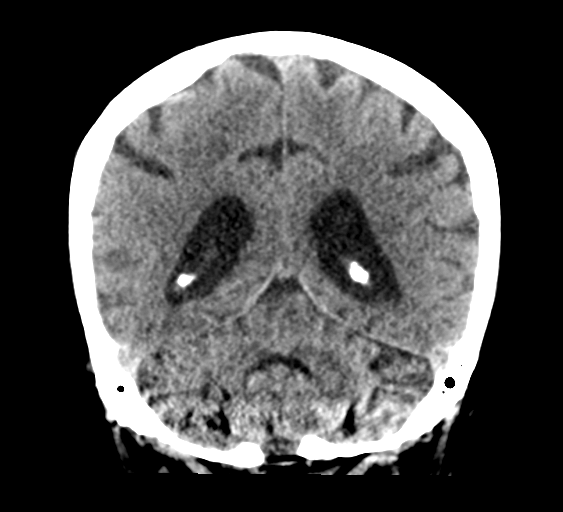
[im 30/67  brain]
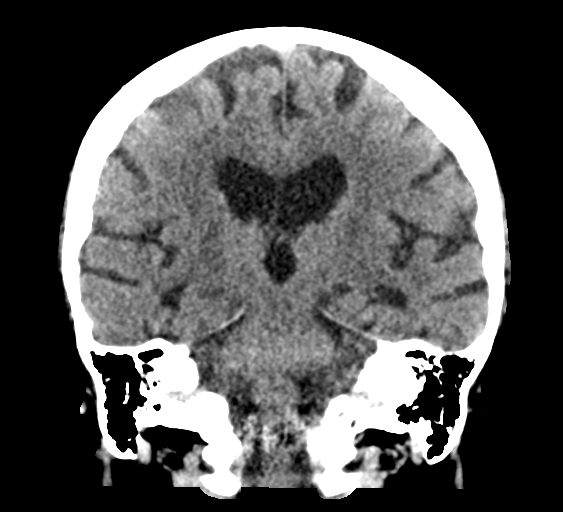
[im 37/67  brain]
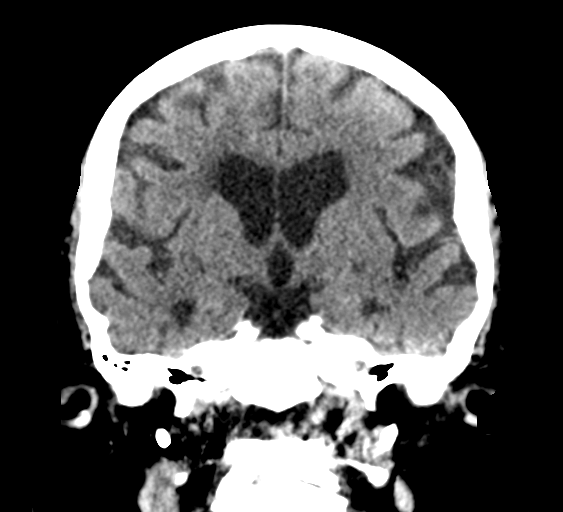

[Series 5: head without sag · sagittal · non-contrast · 0.33mm/px · 3 of 56 slices shown]
[im 19/56  brain]
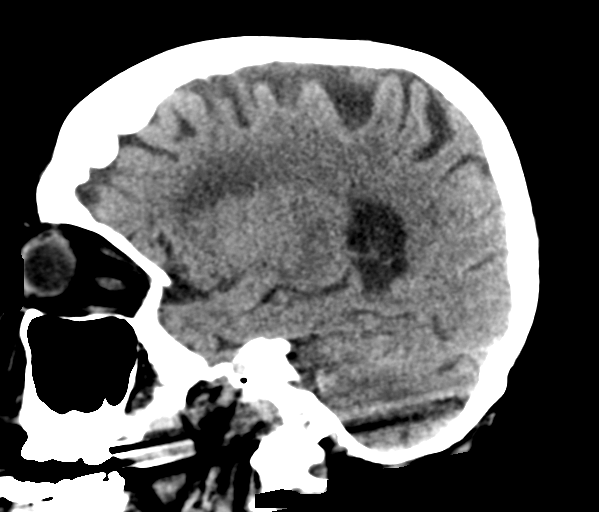
[im 28/56  brain]
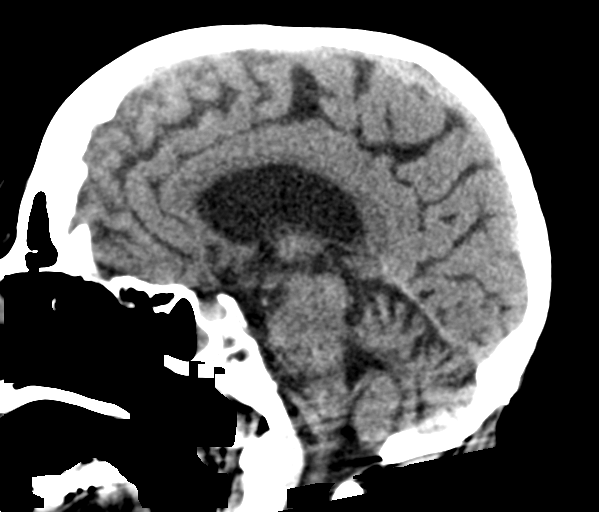
[im 37/56  brain]
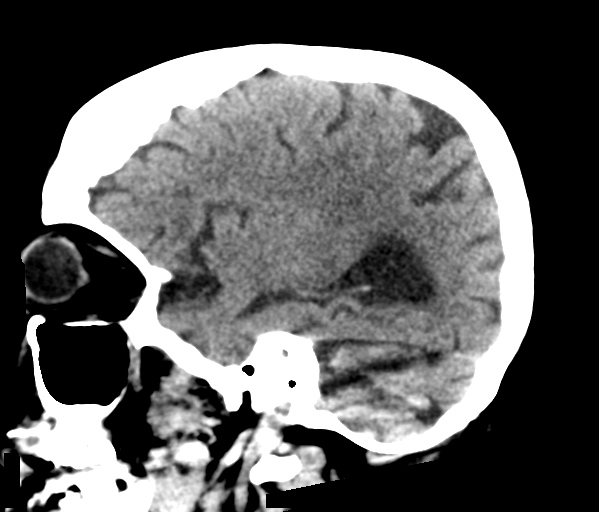

[17 of 47 positions shown; findings below may reference images not displayed]

FINDINGS: Brain: There is atrophy and chronic small vessel disease changes. No
acute intracranial abnormality. Specifically, no hemorrhage,
hydrocephalus, mass lesion, acute infarction, or significant
intracranial injury.

Vascular: No hyperdense vessel or unexpected calcification.

Skull: No acute calvarial abnormality.

Sinuses/Orbits: Visualized paranasal sinuses and mastoids clear.
Orbital soft tissues unremarkable.

Other: None
IMPRESSION: Atrophy, chronic microvascular disease.

No acute intracranial abnormality.

## 2019-12-03 IMAGING — DX DG CHEST 1V PORT
1 series · 1 of 1 positions shown · non-contrast
Comparison: Single-view of the chest [DATE] and [DATE].

CLINICAL DATA: Altered mental status.

EXAM:
PORTABLE CHEST 1 VIEW

[chest]
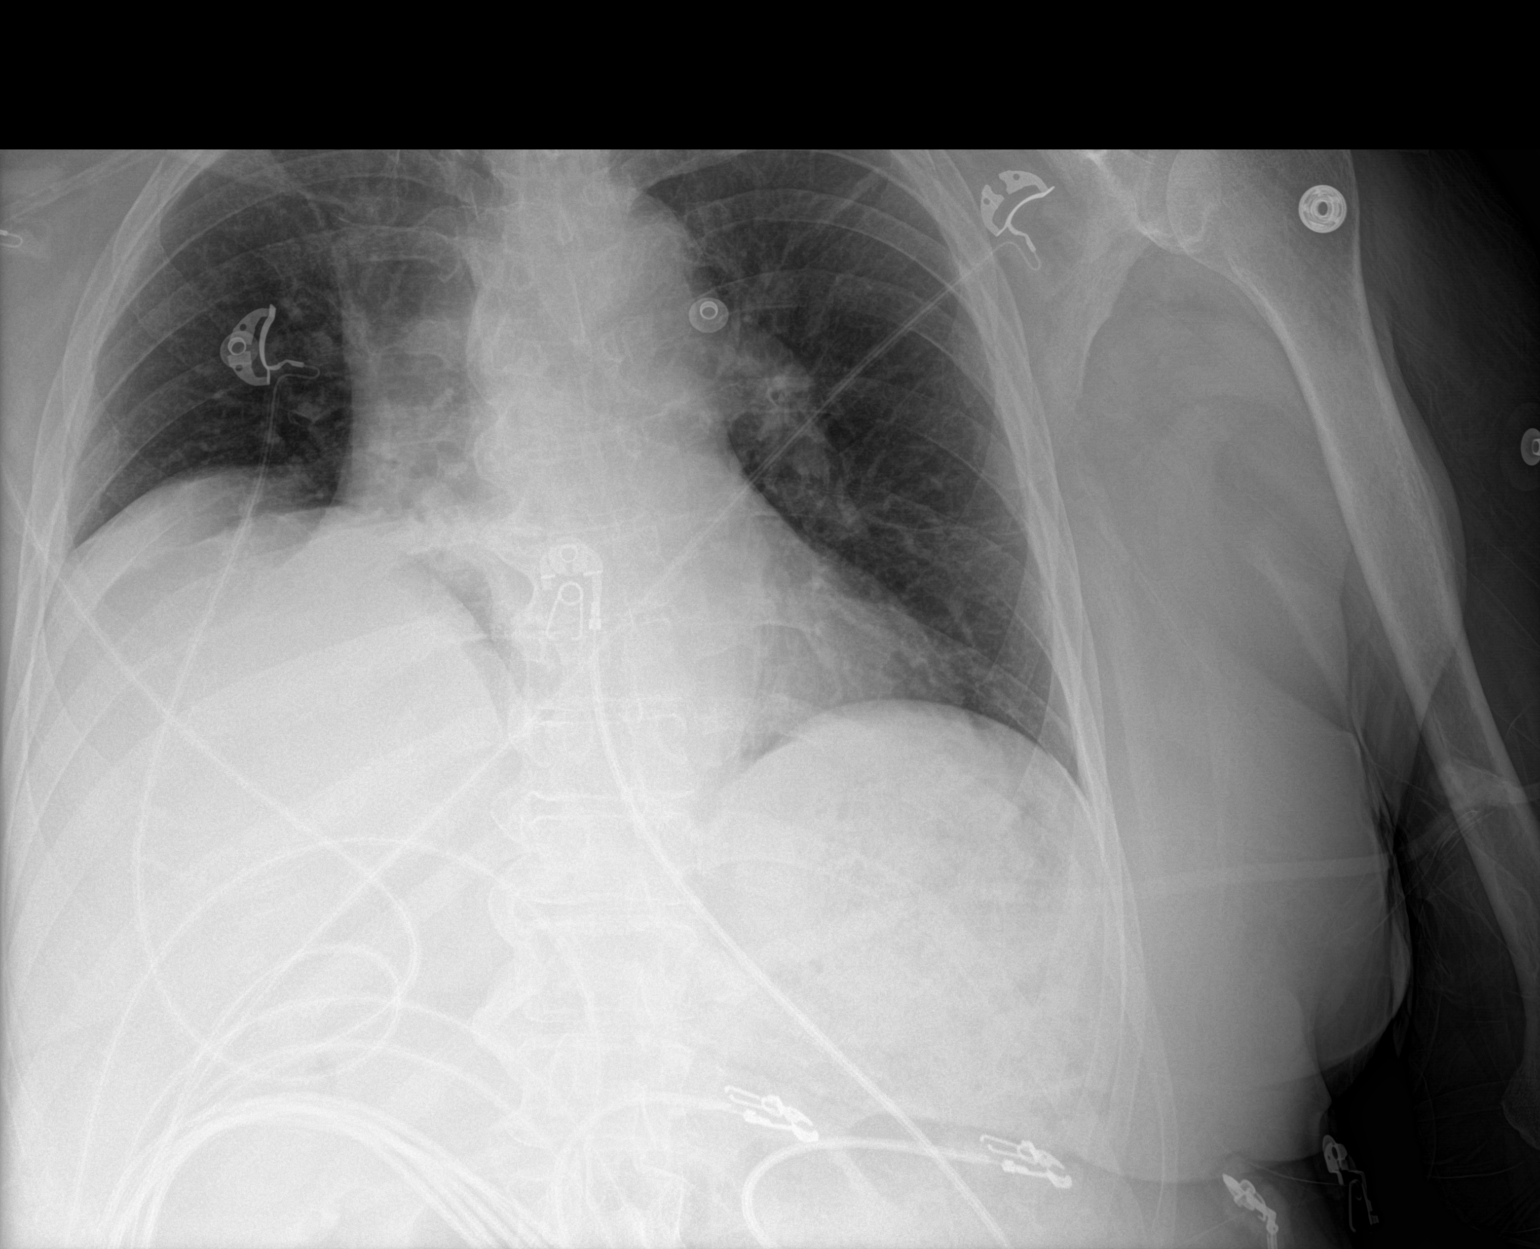

[1 of 1 positions shown; findings below may reference images not displayed]

FINDINGS: Elevation of the right hemidiaphragm relative to the left is new
since the prior examinations and likely due to a subpulmonic
effusion. Associated basilar airspace disease is noted. The left
lung is expanded and clear. No left effusion. Heart size is normal.
IMPRESSION: Findings most suggestive of a subpulmonic right pleural effusion
with associated airspace disease which could be due to atelectasis
or pneumonia.

## 2019-12-03 IMAGING — CT CT ABD-PELV W/ CM
2 of 5 series · 16 of 46 positions shown, 18 images · IV contrast (APPLIED)
Comparison: None.

CLINICAL DATA: 73-year-old female with abdominal pain. Concern for
abscess or infection.

EXAM:
CT ABDOMEN AND PELVIS WITH CONTRAST
TECHNIQUE: Multidetector CT imaging of the abdomen and pelvis was performed
using the standard protocol following bolus administration of
intravenous contrast.
CONTRAST:  100mL OMNIPAQUE IOHEXOL 300 MG/ML  SOLN

[Series 3: abdomen 5.0 · axial · 0.98mm/px · z∈[-409,+56]mm · 13 of 107 slices shown, 15 images]
[im 7/107  soft-tissue]
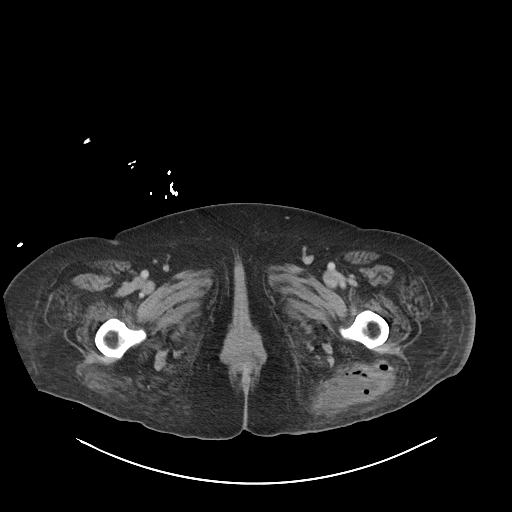
[im 7/107  bone]
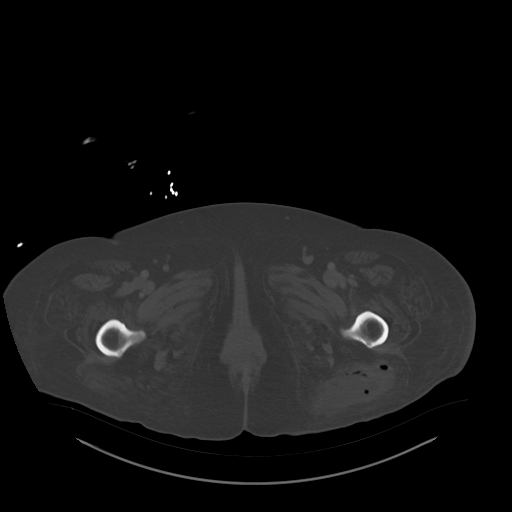
[im 14/107  soft-tissue]
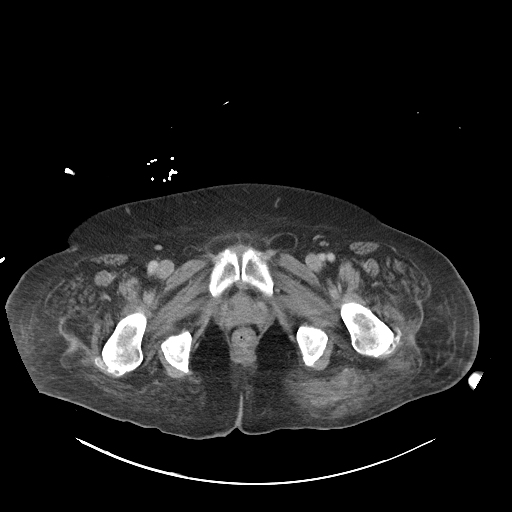
[im 20/107  soft-tissue]
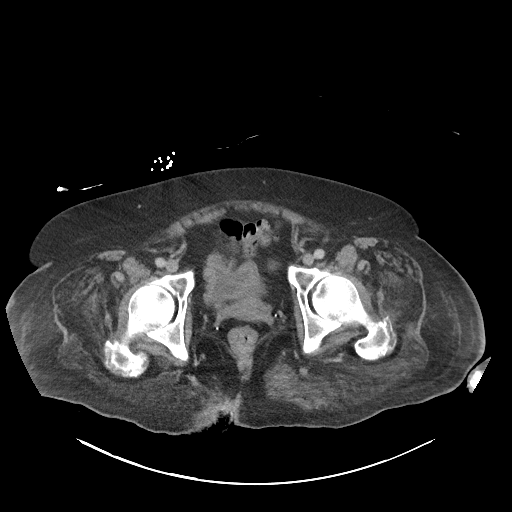
[im 34/107  soft-tissue]
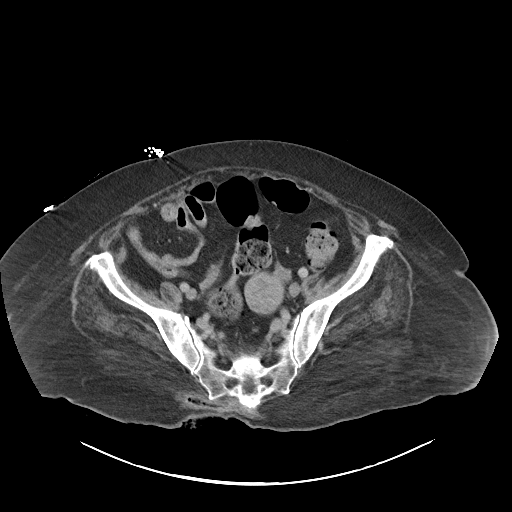
[im 40/107  soft-tissue]
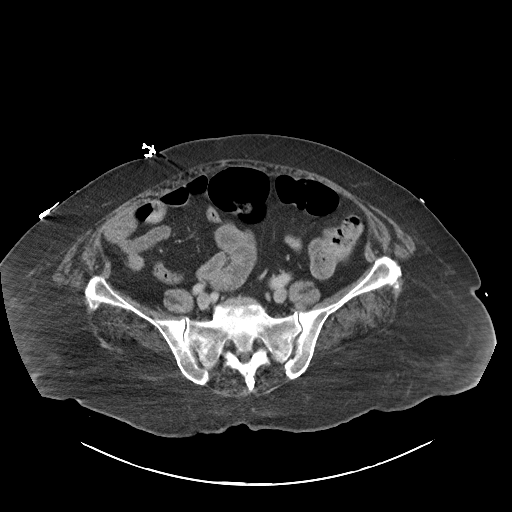
[im 47/107  soft-tissue]
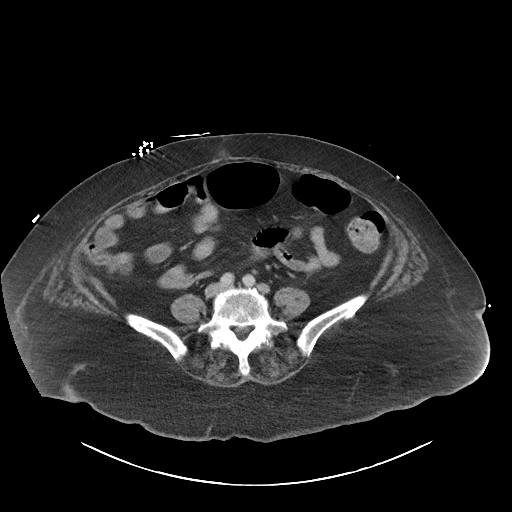
[im 54/107  soft-tissue]
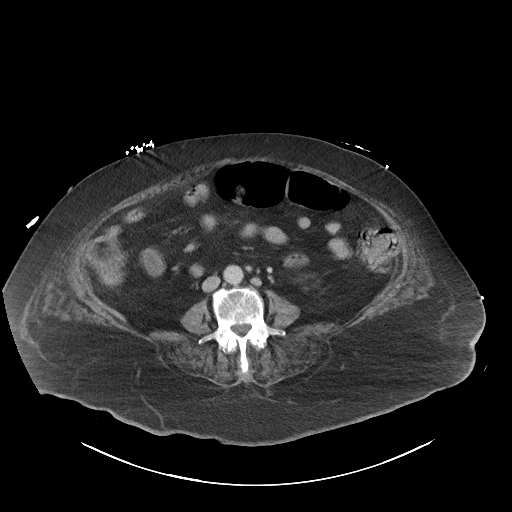
[im 60/107  soft-tissue]
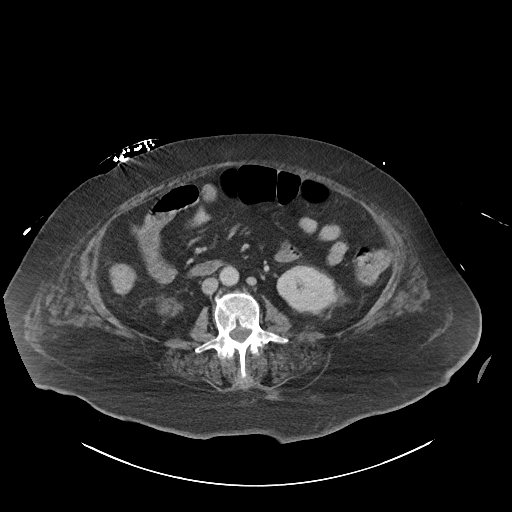
[im 67/107  soft-tissue]
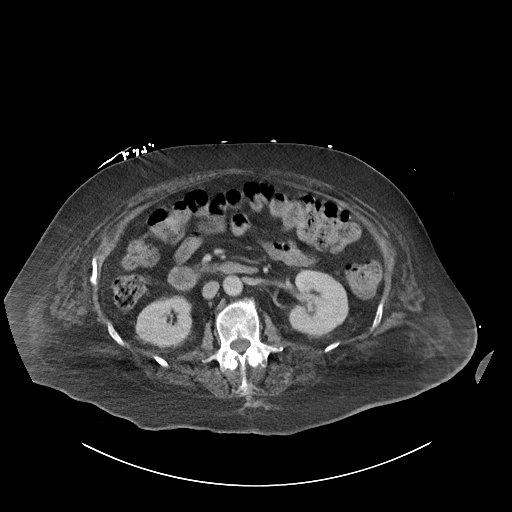
[im 67/107  bone]
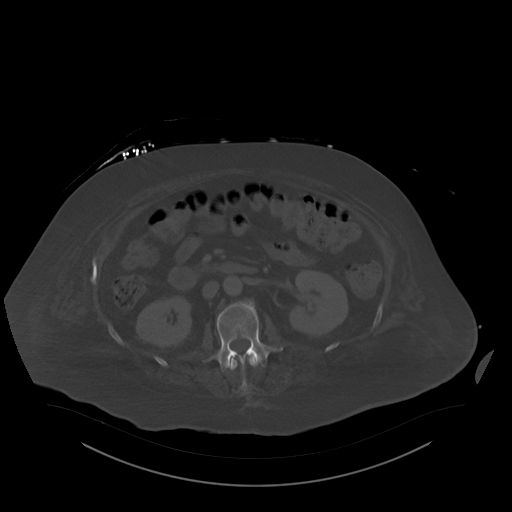
[im 73/107  soft-tissue]
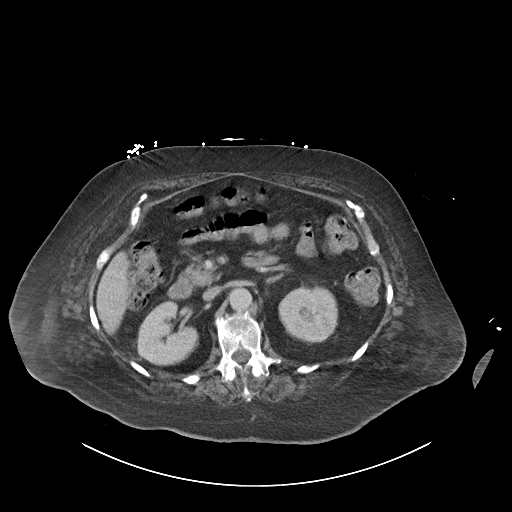
[im 87/107  soft-tissue]
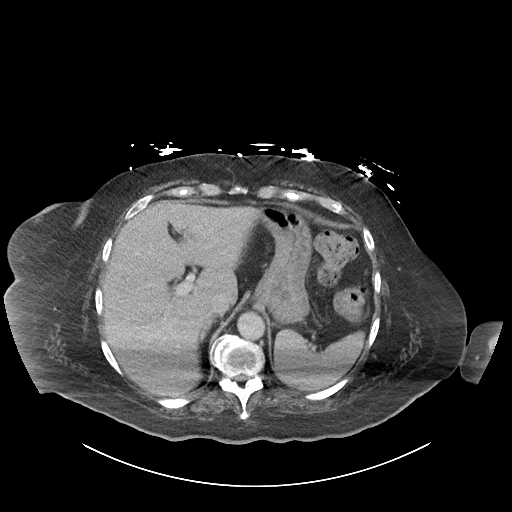
[im 93/107  soft-tissue]
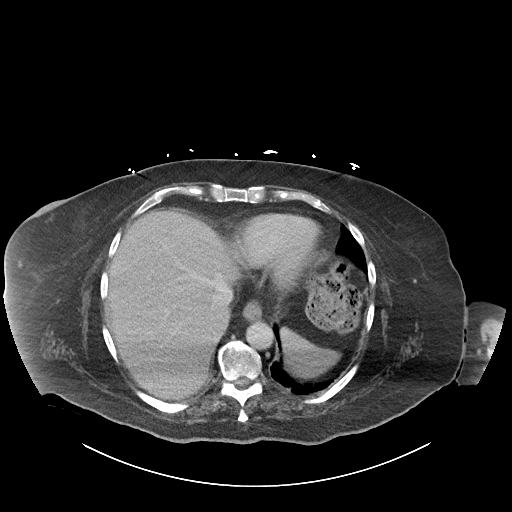
[im 100/107  soft-tissue]
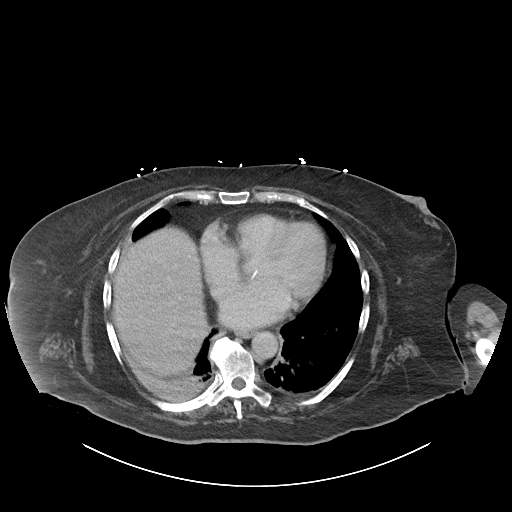

[Series 6: abdomen 3.0 mpr cor · coronal · 1.04mm/px · 3 of 118 slices shown]
[im 40/118  soft-tissue]
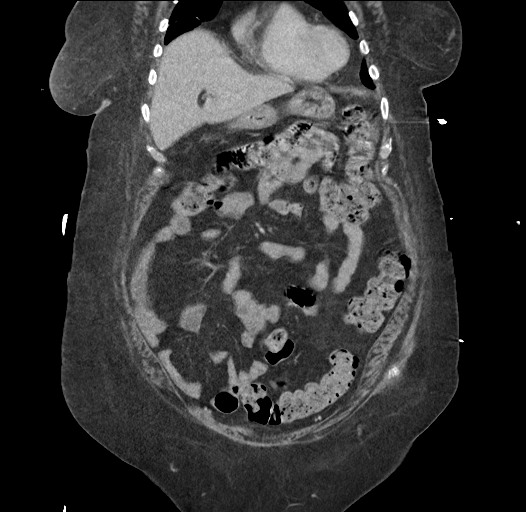
[im 53/118  soft-tissue]
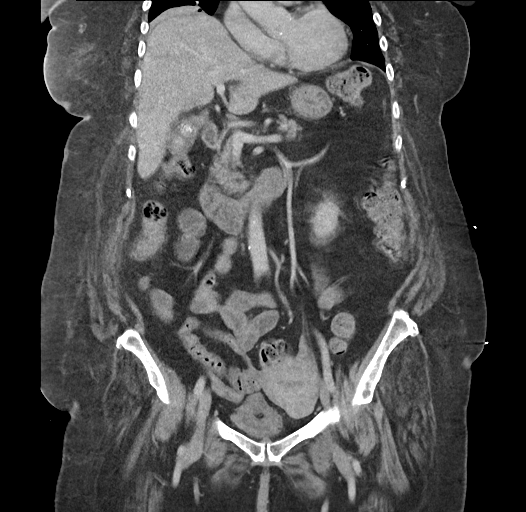
[im 66/118  soft-tissue]
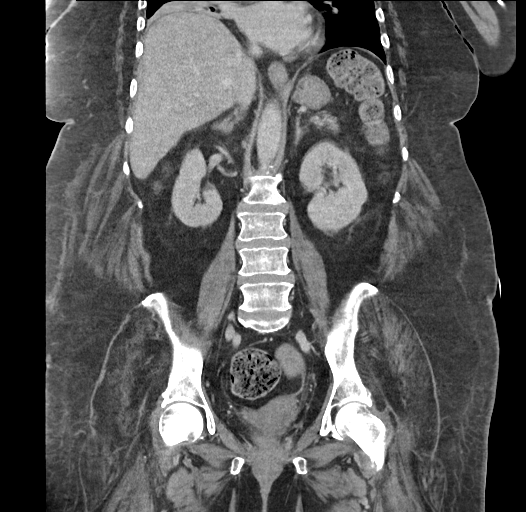

[16 of 46 positions shown; findings below may reference images not displayed]

FINDINGS: Evaluation is limited due to streak artifact caused by patient's
arms.

Lower chest: Patchy area of consolidative change involving the right
lung base and portion of the right middle lobe with air bronchogram
may represent atelectasis or infiltrate. Clinical correlation is
recommended.

There is no intra-abdominal free air or free fluid.

Hepatobiliary: The liver is unremarkable. No intrahepatic biliary
ductal dilatation. Multiple stones noted within the gallbladder. No
pericholecystic fluid or evidence of acute cholecystitis by CT.

Pancreas: Unremarkable. No pancreatic ductal dilatation or
surrounding inflammatory changes.

Spleen: Normal in size without focal abnormality.

Adrenals/Urinary Tract: The adrenal glands unremarkable. There is no
hydronephrosis on either side. There is symmetric enhancement and
excretion of contrast by both kidneys. There is a 1 cm exophytic
hypodense lesion from the upper pole of the right kidney which is
suboptimally characterized but likely represents a cyst. The
visualized ureters appear unremarkable. The urinary bladder is
collapsed.

Stomach/Bowel: There is moderate stool throughout the colon. There
is no bowel obstruction or active inflammation. The appendix is not
visualized with certainty. No inflammatory changes identified in the
right lower quadrant.

Vascular/Lymphatic: Mild aortoiliac atherosclerotic disease. The IVC
is unremarkable. No portal venous gas. There is no adenopathy.

Reproductive: The uterus is anteverted. Multiple fibroids measure up
to 4.5 cm from the posterior uterus. No adnexal masses.

Other: There is a 4 x 8 x 12 cm complex collection with small
pockets of air in the superficial subcutaneous soft tissues of the
left buttock along the inferior aspect of the gluteus maximus
consistent with an infected collection or abscess. This collection
and extends to the coccyx. There is slight fragmentation of the
coccygeal bone most concerning for osteomyelitis. A smaller complex
collection with pockets of air noted superficial to the sacrum to
the right of the midline measuring approximately 0.6 x 1.2 cm. There
is ulceration of the skin over this collection.

Musculoskeletal: Osteopenia with degenerative changes of the spine.
Fragmentation of the distal sacrum/coccygeal bone as above most
concerning for osteomyelitis.
IMPRESSION: 1. Complex collection/abscesses in the superficial subcutaneous soft
tissues of the buttock as described with findings most concerning
for osteomyelitis of the coccyx. Clinical correlation is
recommended.
2. Cholelithiasis.
3. No bowel obstruction.
4. Aortic Atherosclerosis ([H4]-[H4]).
5. Right lung base atelectasis or pneumonia.

## 2019-12-03 MED ORDER — IPRATROPIUM-ALBUTEROL 0.5-2.5 (3) MG/3ML IN SOLN
3.0000 mL | Freq: Four times a day (QID) | RESPIRATORY_TRACT | Status: DC
  Administered 2019-12-04: 3 mL via RESPIRATORY_TRACT

## 2019-12-03 MED ORDER — VITAMIN D 25 MCG (1000 UNIT) PO TABS
1000.0000 [IU] | ORAL_TABLET | Freq: Every day | ORAL | Status: DC
  Administered 2019-12-05 – 2019-12-17 (×12): 1000 [IU] via ORAL
  Filled 2019-12-03 (×13): qty 1

## 2019-12-03 MED ORDER — DIVALPROEX SODIUM 500 MG PO DR TAB
500.0000 mg | DELAYED_RELEASE_TABLET | Freq: Every day | ORAL | Status: DC
  Administered 2019-12-04 – 2019-12-17 (×12): 500 mg via ORAL
  Filled 2019-12-03 (×14): qty 1

## 2019-12-03 MED ORDER — IOHEXOL 300 MG/ML  SOLN
100.0000 mL | Freq: Once | INTRAMUSCULAR | Status: AC | PRN
  Administered 2019-12-03: 100 mL via INTRAVENOUS

## 2019-12-03 MED ORDER — ASCORBIC ACID 500 MG PO TABS
500.0000 mg | ORAL_TABLET | Freq: Two times a day (BID) | ORAL | Status: DC
  Administered 2019-12-04 – 2019-12-17 (×24): 500 mg via ORAL
  Filled 2019-12-03 (×27): qty 1

## 2019-12-03 MED ORDER — ACETAMINOPHEN 325 MG PO TABS
650.0000 mg | ORAL_TABLET | Freq: Four times a day (QID) | ORAL | Status: DC | PRN
  Filled 2019-12-03: qty 2

## 2019-12-03 MED ORDER — SODIUM CHLORIDE 0.9 % IV SOLN: INTRAVENOUS | Status: DC

## 2019-12-03 MED ORDER — ATORVASTATIN CALCIUM 10 MG PO TABS
10.0000 mg | ORAL_TABLET | Freq: Every day | ORAL | Status: DC
  Administered 2019-12-05 – 2019-12-17 (×12): 10 mg via ORAL
  Filled 2019-12-03 (×14): qty 1

## 2019-12-03 MED ORDER — SODIUM CHLORIDE 0.9 % IV BOLUS (SEPSIS)
500.0000 mL | Freq: Once | INTRAVENOUS | Status: AC
  Administered 2019-12-03: 500 mL via INTRAVENOUS

## 2019-12-03 MED ORDER — SODIUM CHLORIDE 0.9 % IV SOLN
2.0000 g | Freq: Three times a day (TID) | INTRAVENOUS | Status: DC
  Administered 2019-12-03 – 2019-12-09 (×17): 2 g via INTRAVENOUS
  Filled 2019-12-03 (×19): qty 2

## 2019-12-03 MED ORDER — ACETAMINOPHEN 650 MG RE SUPP
650.0000 mg | Freq: Four times a day (QID) | RECTAL | Status: DC | PRN
  Administered 2019-12-04: 650 mg via RECTAL
  Filled 2019-12-03: qty 1

## 2019-12-03 MED ORDER — VANCOMYCIN HCL 10 G IV SOLR
2500.0000 mg | Freq: Once | INTRAVENOUS | Status: AC
  Administered 2019-12-03: 2500 mg via INTRAVENOUS
  Filled 2019-12-03: qty 2500

## 2019-12-03 MED ORDER — INSULIN ASPART 100 UNIT/ML ~~LOC~~ SOLN
0.0000 [IU] | Freq: Three times a day (TID) | SUBCUTANEOUS | Status: DC
  Administered 2019-12-04: 8 [IU] via SUBCUTANEOUS
  Administered 2019-12-04: 2 [IU] via SUBCUTANEOUS

## 2019-12-03 MED ORDER — ACETAMINOPHEN 500 MG PO TABS
1000.0000 mg | ORAL_TABLET | Freq: Once | ORAL | Status: AC
  Administered 2019-12-03: 1000 mg via ORAL
  Filled 2019-12-03: qty 2

## 2019-12-03 MED ORDER — VANCOMYCIN HCL IN DEXTROSE 1-5 GM/200ML-% IV SOLN: 1000.0000 mg | Freq: Once | INTRAVENOUS | Status: DC

## 2019-12-03 MED ORDER — DIVALPROEX SODIUM 125 MG PO CSDR
125.0000 mg | DELAYED_RELEASE_CAPSULE | Freq: Every day | ORAL | Status: DC
  Administered 2019-12-05 – 2019-12-16 (×12): 125 mg via ORAL
  Filled 2019-12-03 (×16): qty 1

## 2019-12-03 MED ORDER — SODIUM CHLORIDE 0.9 % IV SOLN
2.0000 g | Freq: Once | INTRAVENOUS | Status: AC
  Administered 2019-12-03: 2 g via INTRAVENOUS
  Filled 2019-12-03: qty 2

## 2019-12-03 MED ORDER — METRONIDAZOLE IN NACL 5-0.79 MG/ML-% IV SOLN
500.0000 mg | Freq: Once | INTRAVENOUS | Status: AC
  Administered 2019-12-03: 500 mg via INTRAVENOUS
  Filled 2019-12-03: qty 100

## 2019-12-03 MED ORDER — INSULIN GLARGINE 100 UNIT/ML ~~LOC~~ SOLN
13.0000 [IU] | Freq: Every day | SUBCUTANEOUS | Status: DC
  Administered 2019-12-04: 13 [IU] via SUBCUTANEOUS
  Filled 2019-12-03 (×2): qty 0.13

## 2019-12-03 MED ORDER — VANCOMYCIN HCL 1500 MG/300ML IV SOLN
1500.0000 mg | INTRAVENOUS | Status: DC
  Administered 2019-12-04 – 2019-12-13 (×10): 1500 mg via INTRAVENOUS
  Filled 2019-12-03 (×10): qty 300

## 2019-12-03 MED ORDER — SODIUM CHLORIDE 0.9 % IV BOLUS (SEPSIS)
1000.0000 mL | Freq: Once | INTRAVENOUS | Status: AC
  Administered 2019-12-03: 1000 mL via INTRAVENOUS

## 2019-12-03 MED ORDER — SENNOSIDES-DOCUSATE SODIUM 8.6-50 MG PO TABS
1.0000 | ORAL_TABLET | Freq: Every day | ORAL | Status: DC
  Administered 2019-12-05 – 2019-12-17 (×11): 1 via ORAL
  Filled 2019-12-03 (×12): qty 1

## 2019-12-03 MED ORDER — CALCIUM CARBONATE ANTACID 500 MG PO CHEW
1.0000 | CHEWABLE_TABLET | Freq: Every day | ORAL | Status: DC
  Administered 2019-12-05 – 2019-12-17 (×12): 200 mg via ORAL
  Filled 2019-12-03 (×13): qty 1

## 2019-12-03 NOTE — ED Notes (Signed)
Help get patient cleaned up on a external cath patient is resting with call bell in reach 

## 2019-12-03 NOTE — H&P (Signed)
History and Physical    Becky Hendricks VZD:[id number] DOB: 11-21-1945 DOA: 12/03/2019  PCP: [name], MD Patient coming from: [facility]  Chief Complaint: Altered mental status, hypotension  HPI: Becky Hendricks is a 74 y.o. female with medical history significant of A. fib on Eliquis, bipolar disorder, hypertension, obesity (BMI 39.14), history of C. 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  It is reported that the patient systolic blood pressure was in the 70s at her facility and she was difficult to arouse.  She has a sacral wound for which she has been receiving Rocephin for the past 1 week.  Her urine has been foul-smelling and she has a history of recurrent UTIs.  She uses 2 L supplemental oxygen at baseline.  She is confused at baseline.  Staff at her facility denied any fevers.  Patient is currently somnolent.  She was able to tell me her name and that she is at a hospital.  No additional history could be obtained from her.  ED Course: Febrile with temperature 100.2 F.  Slightly tachycardic.  Tachypneic with respiratory rate up to 40s.  Systolic in the 29J to 188C.  Satting well on 2 L home oxygen.  Labs showing WBC count 12.6.  Hemoglobin 10.4, previously in the 11-12 range.  Lactic acid normal x2.   UA not suggestive of infection.  Urine culture pending.  Blood culture x2 pending.  Chest x-ray showing a subpulmonic right pleural effusion with associated airspace disease which could be due to atelectasis or pneumonia.  Noted to have a very large deep sacral wound with purulent drainage.   CT abdomen pelvis showing complex collection/abscesses in the superficial subcutaneous soft tissues of  the buttock with findings most concerning for osteomyelitis of the coccyx.  CT also showing patchy area of consolidative change involving the right lung base and portion of the right middle lobe with air bronchogram, may represent atelectasis or infiltrate.  No right pleural effusion reported.  Patient was given vancomycin, cefepime, metronidazole, Tylenol, and 3.5 L normal saline boluses.  ED provider discussed the case with Dr. Dema Severin from general surgery who will see the patient in consultation.  Recommended holding Eliquis.  Review of Systems:  All systems reviewed and apart from history of presenting illness, are negative.  Past Medical History:  Diagnosis Date  . Atrial fibrillation ([place])   . Bipolar disorder ([place])   . Clostridium difficile diarrhea 08/02/2019  . Dehydration   . Essential hypertension   . Morbid obesity ([place])   . Type 2 diabetes mellitus ([place])   . Weakness     Past surgical history: Unable to obtain at this time given patient's altered mental status.   reports that she has never smoked. She has never used smokeless tobacco. She reports previous alcohol use. She reports previous drug use.  Not on File  Family history: Unable to obtain at this time given patient's altered mental status.  Prior to Admission medications   Medication Sig Start Date End Date Taking? Authorizing Provider  acetaminophen (TYLENOL) 325 MG tablet Take 325 mg by mouth every 6 (six) hours as needed for mild pain.   Yes [provider]  amLODipine (NORVASC) 5 MG tablet Take 5 mg by mouth  daily. 08/11/19 08/10/20 Yes [provider]  apixaban (ELIQUIS) 5 MG TABS tablet Take 5 mg by mouth 2 (two) times daily.  08/11/19 08/05/20 Yes [provider]  ascorbic acid (VITAMIN C) 1000 MG tablet Take 500 mg by mouth 2 (two) times daily. 09/08/12  Yes [provider]  atorvastatin (LIPITOR) 10 MG tablet Take 10 mg by mouth daily. 11/21/19  Yes [provider]   calcium carbonate (TUMS - DOSED IN MG ELEMENTAL CALCIUM) 500 MG chewable tablet Chew 1 tablet by mouth daily.   Yes [provider]  cefTRIAXone 1 g in sodium chloride 0.9 % 100 mL Inject 1 g into the vein daily.   Yes [provider]  cholecalciferol (VITAMIN D3) 25 MCG (1000 UNIT) tablet Take 1,000 Units by mouth daily.   Yes [provider]  divalproex (DEPAKOTE SPRINKLE) 125 MG capsule Take 125 mg by mouth at bedtime. 10/30/19  Yes [provider]  divalproex (DEPAKOTE) 500 MG DR tablet Take 500 mg by mouth daily. 06/13/12  Yes [provider]  insulin lispro (HUMALOG) 100 UNIT/ML cartridge Inject into the skin 2 (two) times daily. Units not provided   Yes [provider]  Ipratropium-Albuterol (COMBIVENT) 20-100 MCG/ACT AERS respimat Inhale 1 puff into the lungs every 6 (six) hours. 09/21/19  Yes Amin, Ankit Chirag, MD  JANUMET 50-1000 MG tablet Take 1 tablet by mouth 2 (two) times daily. 10/30/19  Yes [provider]  LANTUS 100 UNIT/ML injection Inject 13 Units into the skin daily. 11/23/19  Yes [provider]  lisinopril (ZESTRIL) 20 MG tablet Take 20 mg by mouth daily. 08/11/19 08/05/20 Yes [provider]  metoprolol succinate (TOPROL-XL) 25 MG 24 hr tablet Take 25 mg by mouth daily.   Yes [provider]  metoprolol tartrate (LOPRESSOR) 25 MG tablet Take 12.5 mg by mouth daily as needed (if HR> 100).  08/11/19 12/03/19 Yes [provider]  Omega-3 Fatty Acids (FISH OIL) 1200 MG CAPS Take 1,200 mg by mouth daily.   Yes [provider]  QUEtiapine (SEROQUEL) 100 MG tablet Take 250 tablets by mouth at bedtime.  06/13/12  Yes [provider]  sennosides-docusate sodium (SENOKOT-S) 8.6-50 MG tablet Take 1 tablet by mouth daily.   Yes [provider]  liraglutide (VICTOZA) 18 MG/3ML SOPN Inject 1.8 mg into the skin daily. 07/23/19   [provider]    Physical  Exam: Vitals:   12/03/19 1700 12/03/19 1900 12/03/19 2100 12/03/19 2130  BP: 131/74 (!) 102/59 (!) 100/57 (!) 120/58  Pulse: (!) 105     Resp: (!) 49 (!) 22  (!) 21  Temp:      TempSrc:      SpO2: 97%     Weight:      Height:        Physical Exam  Constitutional: She appears well-developed and well-nourished. No distress.  HENT:  Head: Normocephalic.  Unable to assess oropharynx due to lack of patient cooperation  Eyes: Pupils are equal, round, and reactive to light. Right eye exhibits no discharge. Left eye exhibits no discharge.  Cardiovascular: Normal rate, regular rhythm and intact distal pulses.  Pulmonary/Chest: No respiratory distress. She has no wheezes. She has no rales.  Equal air entry bilaterally upon auscultation of anterior lung fields  Abdominal: Soft. Bowel sounds are normal. She exhibits no distension. There is no abdominal tenderness. There is no guarding.  Musculoskeletal:     Cervical back: Neck supple.  Comments: Trace pedal edema  Neurological:  Somnolent Oriented to self and knows that she is at hospital Not answering any other questions Not following any commands  Skin: Skin is warm and dry. She is not diaphoretic.  Unable to roll patient over to assess sacral/buttock wounds due to lack of patient cooperation.     Labs on Admission: I have personally reviewed following labs and imaging studies  CBC: Recent Labs  Lab 12/03/19 1332  WBC 12.6*  NEUTROABS 10.8*  HGB 10.4*  HCT 36.8  MCV 97.6  PLT 323   Basic Metabolic Panel: Recent Labs  Lab 12/03/19 1332  NA 139  K 4.5  CL 94*  CO2 35*  GLUCOSE 139*  BUN 13  CREATININE 0.47  CALCIUM 8.6*   GFR: Estimated Creatinine Clearance: 81.1 mL/min (by C-G formula based on SCr of 0.47 mg/dL). Liver Function Tests: Recent Labs  Lab 12/03/19 1332  AST 45*  ALT 43  ALKPHOS 70  BILITOT 0.3  PROT 6.6  ALBUMIN 1.5*   No results for input(s): LIPASE, AMYLASE in the last 168 hours. No  results for input(s): AMMONIA in the last 168 hours. Coagulation Profile: Recent Labs  Lab 12/03/19 1332  INR 1.7*   Cardiac Enzymes: No results for input(s): CKTOTAL, CKMB, CKMBINDEX, TROPONINI in the last 168 hours. BNP (last 3 results) No results for input(s): PROBNP in the last 8760 hours. HbA1C: No results for input(s): HGBA1C in the last 72 hours. CBG: Recent Labs  Lab 12/03/19 1254  GLUCAP 125*   Lipid Profile: No results for input(s): CHOL, HDL, LDLCALC, TRIG, CHOLHDL, LDLDIRECT in the last 72 hours. Thyroid Function Tests: No results for input(s): TSH, T4TOTAL, FREET4, T3FREE, THYROIDAB in the last 72 hours. Anemia Panel: No results for input(s): VITAMINB12, FOLATE, FERRITIN, TIBC, IRON, RETICCTPCT in the last 72 hours. Urine analysis:    Component Value Date/Time   COLORURINE YELLOW 12/03/2019 1700   APPEARANCEUR CLEAR 12/03/2019 1700   LABSPEC 1.017 12/03/2019 1700   PHURINE 5.0 12/03/2019 1700   GLUCOSEU NEGATIVE 12/03/2019 1700   HGBUR NEGATIVE 12/03/2019 1700   BILIRUBINUR NEGATIVE 12/03/2019 1700   KETONESUR 5 (A) 12/03/2019 1700   PROTEINUR NEGATIVE 12/03/2019 1700   NITRITE NEGATIVE 12/03/2019 1700   LEUKOCYTESUR NEGATIVE 12/03/2019 1700    Radiological Exams on Admission: CT ABDOMEN PELVIS W CONTRAST  Result Date: 12/03/2019 CLINICAL DATA:  74 year old female with abdominal pain. Concern for abscess or infection. EXAM: CT ABDOMEN AND PELVIS WITH CONTRAST TECHNIQUE: Multidetector CT imaging of the abdomen and pelvis was performed using the standard protocol following bolus administration of intravenous contrast. CONTRAST:  100mL OMNIPAQUE IOHEXOL 300 MG/ML  SOLN COMPARISON:  None. FINDINGS: Evaluation is limited due to streak artifact caused by patient's arms. Lower chest: Patchy area of consolidative change involving the right lung base and portion of the right middle lobe with air bronchogram may represent atelectasis or infiltrate. Clinical correlation  is recommended. There is no intra-abdominal free air or free fluid. Hepatobiliary: The liver is unremarkable. No intrahepatic biliary ductal dilatation. Multiple stones noted within the gallbladder. No pericholecystic fluid or evidence of acute cholecystitis by CT. Pancreas: Unremarkable. No pancreatic ductal dilatation or surrounding inflammatory changes. Spleen: Normal in size without focal abnormality. Adrenals/Urinary Tract: The adrenal glands unremarkable. There is no hydronephrosis on either side. There is symmetric enhancement and excretion of contrast by both kidneys. There is a 1 cm exophytic hypodense lesion from the upper pole of the right kidney which is suboptimally characterized but  likely represents a cyst. The visualized ureters appear unremarkable. The urinary bladder is collapsed. Stomach/Bowel: There is moderate stool throughout the colon. There is no bowel obstruction or active inflammation. The appendix is not visualized with certainty. No inflammatory changes identified in the right lower quadrant. Vascular/Lymphatic: Mild aortoiliac atherosclerotic disease. The IVC is unremarkable. No portal venous gas. There is no adenopathy. Reproductive: The uterus is anteverted. Multiple fibroids measure up to 4.5 cm from the posterior uterus. No adnexal masses. Other: There is a 4 x 8 x 12 cm complex collection with small pockets of air in the superficial subcutaneous soft tissues of the left buttock along the inferior aspect of the gluteus maximus consistent with an infected collection or abscess. This collection and extends to the coccyx. There is slight fragmentation of the coccygeal bone most concerning for osteomyelitis. A smaller complex collection with pockets of air noted superficial to the sacrum to the right of the midline measuring approximately 0.6 x 1.2 cm. There is ulceration of the skin over this collection. Musculoskeletal: Osteopenia with degenerative changes of the spine. Fragmentation  of the distal sacrum/coccygeal bone as above most concerning for osteomyelitis. IMPRESSION: 1. 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 Clinical correlation is recommended. 2. Cholelithiasis. 3. No bowel obstruction. 4. Aortic Atherosclerosis (ICD10-I70.0). 5. Right lung base atelectasis or pneumonia. Electronically Signed   By: Elgie Collard M.D.   On: 12/03/2019 18:52   DG Chest Port 1 View  Result Date: 12/03/2019 CLINICAL DATA:  Altered mental status. EXAM: PORTABLE CHEST 1 VIEW COMPARISON:  Single-view of the chest 09/18/2019 and 09/07/2019. FINDINGS: Elevation of the right hemidiaphragm relative to the left is new since the prior examinations and likely due to a subpulmonic effusion. Associated basilar airspace disease is noted. The left lung is expanded and clear. No left effusion. Heart size is normal. IMPRESSION: Findings most suggestive of a subpulmonic right pleural effusion with associated airspace disease which could be due to atelectasis or pneumonia. Electronically Signed   By: Drusilla Kanner M.D.   On: 12/03/2019 13:47    EKG: Independently reviewed.  Sinus rhythm, PACs, QTC 541.  QT interval increased since prior tracing.  Assessment/Plan Principal Problem:   Osteomyelitis (HCC) Active Problems:   Acute metabolic encephalopathy   Severe sepsis (HCC)   Abscess of multiple sites of buttock   AF (paroxysmal atrial fibrillation) (HCC)   Severe sepsis secondary to buttock abscesses/osteomyelitis of the coccyx and possible HAP: Febrile with temperature 100.2 F.  Slightly tachycardic on arrival.  Tachypneic with respiratory rate up to 40s.  She was hypotensive at her nursing facility with systolic in the 70s.  In the ED, systolic in the 90s to 110s.  Satting well on 2 L home oxygen. Labs showing WBC count 12.6.  Lactic acid normal x2. CT abdomen pelvis showing  complex collection/abscesses in the superficial subcutaneous soft tissues of the buttock with findings most concerning for osteomyelitis of the coccyx.  CT also showing patchy area of consolidative change involving the right lung base and portion of the right middle lobe with air bronchogram, may represent atelectasis or infiltrate.  -Right buttock abscesses/osteomyelitis of the coccyx: General surgery has been consulted.  Patient is on Eliquis for A. fib which will be held.  Continue IV fluid.  Continue vancomycin and cefepime.  Tylenol as needed.  Blood cultures pending. -Possible HAP: Continue antibiotics.  Patient had Covid in January 2021 (  tested positive on 1/17).  Since she is 3 months past her date of diagnosis, repeat SARS-CoV-2 PCR test has been ordered.  Airborne and contact precautions.  Continuous pulse ox, supplemental oxygen.  Acute metabolic encephalopathy: Suspect related to infection/severe sepsis/hypotension.  It is reported that patient is confused at baseline.  Currently somnolent but able to tell me her name and that she is at hospital.  She did not answer any other questions and did not follow any commands.  Stat head CT has been ordered.  Paroxysmal A. fib: Currently in sinus rhythm.  Hold Eliquis at this time.  Hold metoprolol at this time given soft blood pressure.  Bipolar disorder: Hold quetiapine given worsening QT prolongation on EKG. Continue home Depakote.  Hypertension: Hold antihypertensives at this time given severe sepsis.  Hyperlipidemia: Continue Lipitor  Insulin-dependent type 2 diabetes: Check A1c.  Sliding scale insulin with meals.  Continue home basal insulin.  QT prolongation on EKG: Cardiac monitoring.  Avoid QT prolonging drugs/hold home quetiapine.  Keep potassium above 4 and magnesium above 2.  Repeat EKG in a.m.  DVT prophylaxis: SCDs Code Status: Full code Family Communication: No family available at this time. Disposition Plan: Patient will need  IV antibiotics for several days and possible surgical debridement. Consults called: General surgery Admission status: It is my clinical opinion that admission to INPATIENT is reasonable and necessary because of the expectation that this patient will require hospital care that crosses at least 2 midnights to treat this condition based on the medical complexity of the problems presented.  Given the aforementioned information, the predictability of an adverse outcome is felt to be significant.  The medical decision making on this patient was of high complexity and the patient is at high risk for clinical deterioration, therefore this is a level 3 visit.  John Giovanni MD [facility]  If 7PM-7AM, please contact night-coverage [email or web address]  12/03/2019, 10:12 PM

## 2019-12-03 NOTE — ED Notes (Signed)
Paged g/s to Dr Stevie Kern

## 2019-12-03 NOTE — ED Provider Notes (Signed)
[facility] EMERGENCY DEPARTMENT Provider Note   CSN: [id number] Arrival date & time: 12/03/19  1234     History No chief complaint on file. Septic  Becky Hendricks is a 74 y.o. female.  The history is provided by the patient and medical records. No language interpreter was used.  Fever Temp source:  Oral Severity:  Moderate Onset quality:  Gradual Timing:  Constant Progression:  Worsening Chronicity:  New Relieved by:  Nothing Worsened by:  Nothing Ineffective treatments:  None tried Associated symptoms: confusion, dysuria and somnolence   Associated symptoms: no chest pain, no chills, no cough, no diarrhea, no headaches, no nausea, no rhinorrhea and no vomiting   Risk factors: recent sickness        Past Medical History:  Diagnosis Date  . Atrial fibrillation ([place])   . Bipolar disorder ([place])   . Clostridium difficile diarrhea 08/02/2019  . Dehydration   . Essential hypertension   . Morbid obesity ([place])   . Type 2 diabetes mellitus ([place])   . Weakness     Patient Active Problem List   Diagnosis Date Noted  . Respiratory failure with hypoxia ([place]) 09/15/2019  . Pneumonia due to COVID-19 virus 09/15/2019  . Acute metabolic encephalopathy 95/18/8416  . UTI (urinary tract infection), bacterial 09/15/2019  . Pressure injury of skin 09/10/2019  . AKI (acute kidney injury) ([place])   . Acute respiratory distress syndrome (ARDS) due to COVID-19 virus ([place]) 09/07/2019    No past surgical history on file.   OB History   No obstetric history on file.     Family History  Family history unknown: Yes    Social History   Tobacco Use  . Smoking status: Never Smoker  . Smokeless tobacco: Never Used  Substance Use Topics  . Alcohol use: Not Currently  . Drug use: Not Currently    Home Medications Prior to Admission medications   Medication Sig Start Date End Date Taking? Authorizing Provider  amLODipine (NORVASC) 5 MG tablet Take 5 mg by  mouth daily. 08/11/19 08/10/20  [provider]  apixaban (ELIQUIS) 5 MG TABS tablet Take 5 mg by mouth daily. 08/11/19 08/05/20  [provider]  ascorbic acid (VITAMIN C) 1000 MG tablet Take 500 mg by mouth 2 (two) times daily. 09/08/12   [provider]  Calcium Carbonate-Vitamin D 600-200 MG-UNIT TABS Take 1 tablet by mouth daily. 09/08/12   [provider]  divalproex (DEPAKOTE) 500 MG DR tablet Take 500 mg by mouth daily. 06/13/12   [provider]  Ipratropium-Albuterol (COMBIVENT) 20-100 MCG/ACT AERS respimat Inhale 1 puff into the lungs every 6 (six) hours. 09/21/19   Amin, Ankit Chirag, MD  liraglutide (VICTOZA) 18 MG/3ML SOPN Inject 1.8 mg into the skin daily. 07/23/19   [provider]  lisinopril (ZESTRIL) 20 MG tablet Take 20 mg by mouth daily. 08/11/19 08/05/20  [provider]  Melatonin 3 MG TABS Take 3 mg by mouth at bedtime. 08/11/19 08/05/20  [provider]  metFORMIN (GLUCOPHAGE-XR) 500 MG 24 hr tablet Take 1,000 mg by mouth 2 (two) times daily. 10/22/17   [provider]  metoprolol tartrate (LOPRESSOR) 25 MG tablet Take 25 mg by mouth 2 (two) times daily. 08/11/19 09/10/19  [provider]  Multiple Vitamin (MULTIVITAMIN WITH MINERALS) TABS tablet Take 1 tablet by mouth daily.    [provider]  Omega-3 Fatty Acids (FISH OIL) 1200 MG CAPS Take 1,200 mg by mouth daily.  [provider]  QUEtiapine (SEROQUEL) 100 MG tablet Take 2.5 tablets by mouth at bedtime. 06/13/12   [provider]    Allergies    Patient has no allergy information on record.  Review of Systems   Review of Systems  Constitutional: Positive for fatigue and fever. Negative for chills and diaphoresis.  HENT: Negative for rhinorrhea.   Respiratory: Negative for cough, chest tightness, shortness of breath and wheezing.   Cardiovascular: Negative for chest pain, palpitations and leg swelling.    Gastrointestinal: Negative for abdominal pain, constipation, diarrhea, nausea and vomiting.  Genitourinary: Positive for dysuria. Negative for frequency.  Musculoskeletal: Positive for back pain. Negative for neck pain and neck stiffness.  Skin: Positive for wound.  Neurological: Positive for light-headedness. Negative for headaches.  Psychiatric/Behavioral: Positive for confusion. Negative for agitation.  All other systems reviewed and are negative.   Physical Exam Updated Vital Signs BP (!) 102/56 (BP Location: Right Arm)   Pulse 97   Temp 99.5 F (37.5 C)   Resp 19   Ht 5\' 6"  (1.676 m)   Wt 110 kg   SpO2 100%   BMI 39.14 kg/m   Physical Exam Vitals and nursing note reviewed.  Constitutional:      General: She is not in acute distress.    Appearance: She is well-developed. She is obese. She is ill-appearing. She is not toxic-appearing or diaphoretic.  HENT:     Head: Normocephalic and atraumatic.     Right Ear: External ear normal.     Left Ear: External ear normal.     Nose: Nose normal.     Mouth/Throat:     Mouth: Mucous membranes are moist.     Pharynx: No oropharyngeal exudate.  Eyes:     Extraocular Movements: Extraocular movements intact.     Conjunctiva/sclera: Conjunctivae normal.     Pupils: Pupils are equal, round, and reactive to light.  Cardiovascular:     Rate and Rhythm: Tachycardia present.     Pulses: Normal pulses.     Heart sounds: No murmur.  Pulmonary:     Effort: Pulmonary effort is normal. No respiratory distress.     Breath sounds: No stridor. Rhonchi present. No wheezing or rales.  Chest:     Chest wall: No tenderness.  Abdominal:     General: Abdomen is flat. There is no distension.     Tenderness: There is no abdominal tenderness. There is no right CVA tenderness, left CVA tenderness or rebound.  Musculoskeletal:     Cervical back: Normal range of motion and neck supple.       Back:  Skin:    General: Skin is warm.     Capillary  Refill: Capillary refill takes less than 2 seconds.     Findings: Erythema present. No rash.  Neurological:     General: No focal deficit present.     Mental Status: She is alert.     Sensory: No sensory deficit.     Motor: No weakness or abnormal muscle tone.     Deep Tendon Reflexes: Reflexes are normal and symmetric.  Psychiatric:        Mood and Affect: Mood normal.     ED Results / Procedures / Treatments   Labs (all labs ordered are listed, but only abnormal results are displayed) Labs Reviewed  COMPREHENSIVE METABOLIC PANEL - Abnormal; Notable for the following components:      Result Value   Chloride 94 (*)    CO2  35 (*)    Glucose, Bld 139 (*)    Calcium 8.6 (*)    Albumin 1.5 (*)    AST 45 (*)    All other components within normal limits  CBC WITH DIFFERENTIAL/PLATELET - Abnormal; Notable for the following components:   WBC 12.6 (*)    RBC 3.77 (*)    Hemoglobin 10.4 (*)    MCHC 28.3 (*)    RDW 15.7 (*)    nRBC 0.3 (*)    Neutro Abs 10.8 (*)    Abs Immature Granulocytes 0.21 (*)    All other components within normal limits  PROTIME-INR - Abnormal; Notable for the following components:   Prothrombin Time 19.7 (*)    INR 1.7 (*)    All other components within normal limits  CBG MONITORING, ED - Abnormal; Notable for the following components:   Glucose-Capillary 125 (*)    All other components within normal limits  CULTURE, BLOOD (ROUTINE X 2)  CULTURE, BLOOD (ROUTINE X 2)  URINE CULTURE  LACTIC ACID, PLASMA  APTT  LACTIC ACID, PLASMA  URINALYSIS, ROUTINE W REFLEX MICROSCOPIC    EKG EKG Interpretation  Date/Time:  Thursday December 03 2019 12:50:36 EDT Ventricular Rate:  98 PR Interval:    QRS Duration: 75 QT Interval:  423 QTC Calculation: 541 R Axis:   41 Text Interpretation: Sinus tachycardia Atrial premature complexes in couplets Nonspecific T abnrm, anterolateral leads Prolonged QT interval When compared to prior, longer QTC and slower rate. No  STEMI Confirmed by Theda Belfastegeler, Chris ([id number]) on 12/03/2019 12:52:29 PM   Radiology DG Chest Port 1 View  Result Date: 12/03/2019 CLINICAL DATA:  Altered mental status. EXAM: PORTABLE CHEST 1 VIEW COMPARISON:  Single-view of the chest 09/18/2019 and 09/07/2019. FINDINGS: Elevation of the right hemidiaphragm relative to the left is new since the prior examinations and likely due to a subpulmonic effusion. Associated basilar airspace disease is noted. The left lung is expanded and clear. No left effusion. Heart size is normal. IMPRESSION: Findings most suggestive of a subpulmonic right pleural effusion with associated airspace disease which could be due to atelectasis or pneumonia. Electronically Signed   By: Drusilla Kannerhomas  Dalessio M.D.   On: 12/03/2019 13:47    Procedures Procedures (including critical care time)  CRITICAL CARE Performed by: Canary Brimhristopher J Sanyiah Kanzler Total critical care time: 35 minutes Critical care time was exclusive of separately billable procedures and treating other patients. Critical care was necessary to treat or prevent imminent or life-threatening deterioration. Critical care was time spent personally by me on the following activities: development of treatment plan with patient and/or surrogate as well as nursing, discussions with consultants, evaluation of patient's response to treatment, examination of patient, obtaining history from patient or surrogate, ordering and performing treatments and interventions, ordering and review of laboratory studies, ordering and review of radiographic studies, pulse oximetry and re-evaluation of patient's condition.   Medications Ordered in ED Medications  vancomycin (VANCOCIN) 2,500 mg in sodium chloride 0.9 % 500 mL IVPB (2,500 mg Intravenous New Bag/Given 12/03/19 1441)  vancomycin (VANCOREADY) IVPB 1500 mg/300 mL (has no administration in time range)  ceFEPIme (MAXIPIME) 2 g in sodium chloride 0.9 % 100 mL IVPB (has no administration in time  range)  acetaminophen (TYLENOL) tablet 1,000 mg (has no administration in time range)  ceFEPIme (MAXIPIME) 2 g in sodium chloride 0.9 % 100 mL IVPB (0 g Intravenous Stopped 12/03/19 1559)  metroNIDAZOLE (FLAGYL) IVPB 500 mg (0 mg Intravenous Stopped 12/03/19 1559)  sodium  chloride 0.9 % bolus 1,000 mL (0 mLs Intravenous Stopped 12/03/19 1559)    And  sodium chloride 0.9 % bolus 1,000 mL (0 mLs Intravenous Stopped 12/03/19 1559)    And  sodium chloride 0.9 % bolus 1,000 mL (0 mLs Intravenous Stopped 12/03/19 1559)    And  sodium chloride 0.9 % bolus 500 mL (0 mLs Intravenous Stopped 12/03/19 1559)    ED Course  I have reviewed the triage vital signs and the nursing notes.  Pertinent labs & imaging results that were available during my care of the patient were reviewed by me and considered in my medical decision making (see chart for details).    MDM Rules/Calculators/A&P                      Becky Hendricks is a 74 y.o. 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  According to EMS report to nursing, patient was difficult to arouse this morning and was somnolent.  They found her to have blood pressure in the 70s systolic and was having foul-smelling urine.  There were concerned about recurrent UTI.  She is reportedly taking Rocephin orally for the last week for this and this is confirmed on her medication documentation.  On arrival, when yelling loudly, patient wants her questions.  She reports feeling tired and fatigued and lightheaded but denies any pain.  She physically denies any chest pain, shortness of breath, palpitations.  She denies any cough.  She  reports some pain when she was rolling in her low back and she does agree that her urine is more foul-smelling.  She denies other complaints.  On exam, patient has a very large deep sacral wound that when rolled started pouring purulence and pus out of.  Very foul-smelling concerning for deep infection of the back.  Patient otherwise had nontender abdomen and lungs or slightly coarse.  Chest and abdomen nontender.  Patient moving all extremities.  Patient is somnolent but arousable to voice.  Rectal temperature was elevated and febrile.  Blood pressure was around 100 systolic and EMS reported it was in the 70s.  Clinically I am concerned about sepsis with the hypotension and altered mental status.  I am most concerned about her having a deep infection in her back versus UTI.  Will get CT scan to further evaluate for deep abscess that may need surgical management or debridement.  We will go ahead and start broad-spectrum antibiotics given her low blood pressure and vital signs.  We will give fluids.  She is on her home oxygen currently and her lungs did not sound like rales.  Will get chest x-ray and urinalysis.  Chart shows that she was positive for coronavirus within 3 months ago so she will not be tested again.  Anticipate admission for sepsis.   Final Clinical Impression(s) / ED Diagnoses Final diagnoses:  Sepsis, due to unspecified organism, unspecified whether acute organ dysfunction present (HCC)  Wound of sacral region, initial encounter    Clinical Impression: 1. Sepsis, due to unspecified organism, unspecified whether acute organ dysfunction present (HCC)   2. Wound of sacral region, initial encounter     Disposition: Admit  This note was prepared with assistance  of Conservation officer, historic buildings. Occasional wrong-word or sound-a-like substitutions may have occurred due to the inherent limitations of voice recognition software.      Idora Brosious, Canary Brim, MD 12/03/19 [phone]

## 2019-12-03 NOTE — Progress Notes (Signed)
Pharmacy Antibiotic Note  Becky Hendricks is a 74 y.o. female admitted on 12/03/2019 with sepsis.  Patient has purulent sacral wound and was taking CTX for 1 week prior. Has hx of recurrent UTI. Pharmacy has been consulted for Vancomycin and Cefepime dosing. Patient febrile (Tm 102.2) with WBC elevated 12.6, lactate wnl 1.5. Scr 0.47.  Plan: Vancomycin 2500mg  IV once, then 1500mg  IV Q24 hrs (Est AUC 500.3, Scr used 0.8, Vd 0.5) Cefepime 2g Q 8 hrs Monitor renal function, cultures/sensitivities, clinical progression Check vancomycin levels as indicated  Height: 5\' 6"  (167.6 cm) Weight: 110 kg (242 lb 8.1 oz) IBW/kg (Calculated) : 59.3  Temp (24hrs), Avg:99.5 F (37.5 C), Min:99.5 F (37.5 C), Max:99.5 F (37.5 C)  No results for input(s): WBC, CREATININE, LATICACIDVEN, VANCOTROUGH, VANCOPEAK, VANCORANDOM, GENTTROUGH, GENTPEAK, GENTRANDOM, TOBRATROUGH, TOBRAPEAK, TOBRARND, AMIKACINPEAK, AMIKACINTROU, AMIKACIN in the last 168 hours.  CrCl cannot be calculated (Patient's most recent lab result is older than the maximum 21 days allowed.).    Not on File  Antimicrobials this admission: Vancomycin 4/22 >>  Cefepime 4/22 >>  Flagyl 4/22 x1  Dose adjustments this admission: N/A  Microbiology results: 4/22 BCx:  4/22 UCx:    5/22, PharmD PGY1 Pharmacy Resident Phone: [phone] 12/03/2019  1:18 PM  Please check [email or web address] for unit-specific pharmacy phone numbers.

## 2019-12-03 NOTE — ED Provider Notes (Signed)
Signout note  74 year old lady to ER with fever, worsening sacral wound.  On exam patient febrile, mildly tachycardic but stable BP, significant purulence, foul-smelling sacral wound.  Concern for possible sepsis, started on broad-spectrum antibiotics, fluid bolus.  CT scan ordered to assess for abscess, osteo.  Anticipate admission after CT, UA have been completed.   3:30 PM receieved sign out from [place]  4:25 PM rechecked patient, awaiting CT, UA, RN to cath  8:25 PM discussed with general surgery, Dr. Cliffton Asters who will come assess patient, rec hold eliquis or switch to heparin, admit to medicine, continue IV abx  8:51 PM discussed with Loney Loh - will accept to [facility]    Milagros Loll, MD 12/03/19 2351

## 2019-12-03 NOTE — Consult Note (Signed)
CC/Reason for consult: Left buttock abscess/collection; hx of decubitus ulcers  Requesting provider: Dr. Stevie Kern  HPI: Becky Hendricks is an 74 y.o. female with hx of HTN, DM, HLD, Afib (on eliquis), decubitus ulcers, malnutrition - recently had COVID and was at [facility] - 1/25 - 2/9; developed sacral ulcers; had been at [facility] place and nursing home undergoing wound care. She presented to ED with fever and foul smelling drainage from her chronic sacral wound. She is a poor historian which limits complete history taking. Currently, she denies pain in her back or buttocks. She acknowledges that she has been undergoing debridements last month of a wound on her right buttock. Denies complaints currently including nausea/vomiting/buttock pain.  Past Medical History:  Diagnosis Date  . Atrial fibrillation (HCC)   . Bipolar disorder (HCC)   . Clostridium difficile diarrhea 08/02/2019  . Dehydration   . Essential hypertension   . Morbid obesity (HCC)   . Type 2 diabetes mellitus (HCC)   . Weakness     No past surgical history on file.  Family History  Family history unknown: Yes    Social:  reports that she has never smoked. She has never used smokeless tobacco. She reports previous alcohol use. She reports previous drug use.  Allergies: Not on File  Medications: I have reviewed the patient's current medications.  Results for orders placed or performed during the hospital encounter of 12/03/19 (from the past 48 hour(s))  CBG monitoring, ED     Status: Abnormal   Collection Time: 12/03/19 12:54 PM  Result Value Ref Range   Glucose-Capillary 125 (H) 70 - 99 mg/dL    Comment: Glucose reference range applies only to samples taken after fasting for at least 8 hours.   Comment 1 Document in Chart   Lactic acid, plasma     Status: None   Collection Time: 12/03/19  1:32 PM  Result Value Ref Range   Lactic Acid, Venous 1.5 0.5 - 1.9 mmol/L    Comment: Performed at [facility], [address], [place], [place] [address]  Comprehensive metabolic panel     Status: Abnormal   Collection Time: 12/03/19  1:32 PM  Result Value Ref Range   Sodium 139 135 - 145 mmol/L   Potassium 4.5 3.5 - 5.1 mmol/L   Chloride 94 (L) 98 - 111 mmol/L   CO2 35 (H) 22 - 32 mmol/L   Glucose, Bld 139 (H) 70 - 99 mg/dL    Comment: Glucose reference range applies only to samples taken after fasting for at least 8 hours.   BUN 13 8 - 23 mg/dL   Creatinine, Ser 4.70 0.44 - 1.00 mg/dL   Calcium 8.6 (L) 8.9 - 10.3 mg/dL   Total Protein 6.6 6.5 - 8.1 g/dL   Albumin 1.5 (L) 3.5 - 5.0 g/dL   AST 45 (H) 15 - 41 U/L   ALT 43 0 - 44 U/L   Alkaline Phosphatase 70 38 - 126 U/L   Total Bilirubin 0.3 0.3 - 1.2 mg/dL   GFR calc non Af Amer >60 >60 mL/min   GFR calc Af Amer >60 >60 mL/min   Anion gap 10 5 - 15    Comment: Performed at [facility], [address], [place], [place] [address]  CBC WITH DIFFERENTIAL     Status: Abnormal   Collection Time: 12/03/19  1:32 PM  Result Value Ref Range   WBC 12.6 (H) 4.0 - 10.5 K/uL   RBC  3.77 (L) 3.87 - 5.11 MIL/uL   Hemoglobin 10.4 (L) 12.0 - 15.0 g/dL   HCT 93.7 90.2 - 40.9 %   MCV 97.6 80.0 - 100.0 fL   MCH 27.6 26.0 - 34.0 pg   MCHC 28.3 (L) 30.0 - 36.0 g/dL   RDW 73.5 (H) 32.9 - 92.4 %   Platelets 323 150 - 400 K/uL   nRBC 0.3 (H) 0.0 - 0.2 %   Neutrophils Relative % 85 %   Neutro Abs 10.8 (H) 1.7 - 7.7 K/uL   Lymphocytes Relative 6 %   Lymphs Abs 0.8 0.7 - 4.0 K/uL   Monocytes Relative 5 %   Monocytes Absolute 0.6 0.1 - 1.0 K/uL   Eosinophils Relative 1 %   Eosinophils Absolute 0.1 0.0 - 0.5 K/uL   Basophils Relative 1 %   Basophils Absolute 0.1 0.0 - 0.1 K/uL   Immature Granulocytes 2 %   Abs Immature Granulocytes 0.21 (H) 0.00 - 0.07 K/uL    Comment: Performed at [facility], [address], [place], [place] [address]  APTT     Status: None   Collection Time: 12/03/19  1:32 PM  Result Value Ref Range   aPTT 35 24 - 36 seconds     Comment: Performed at [facility], [address] [place], [place] [address]  Protime-INR     Status: Abnormal   Collection Time: 12/03/19  1:32 PM  Result Value Ref Range   Prothrombin Time 19.7 (H) 11.4 - 15.2 seconds   INR 1.7 (H) 0.8 - 1.2    Comment: (NOTE) INR goal varies based on device and disease states. Performed at [facility], [address], [place], [place] [address]   Lactic acid, plasma     Status: None   Collection Time: 12/03/19  4:55 PM  Result Value Ref Range   Lactic Acid, Venous 1.5 0.5 - 1.9 mmol/L    Comment: Performed at [facility], [address] [place], [place] [address]  Urinalysis, Routine w reflex microscopic     Status: Abnormal   Collection Time: 12/03/19  5:00 PM  Result Value Ref Range   Color, Urine YELLOW YELLOW   APPearance CLEAR CLEAR   Specific Gravity, Urine 1.017 1.005 - 1.030   pH 5.0 5.0 - 8.0   Glucose, UA NEGATIVE NEGATIVE mg/dL   Hgb urine dipstick NEGATIVE NEGATIVE   Bilirubin Urine NEGATIVE NEGATIVE   Ketones, ur 5 (A) NEGATIVE mg/dL   Protein, ur NEGATIVE NEGATIVE mg/dL   Nitrite NEGATIVE NEGATIVE   Leukocytes,Ua NEGATIVE NEGATIVE    Comment: Performed at [facility], [address] [place], [place] [address]    CT ABDOMEN PELVIS W CONTRAST  Result Date: 12/03/2019 CLINICAL DATA:  74 year old female with abdominal pain. Concern for abscess or infection. EXAM: CT ABDOMEN AND PELVIS WITH CONTRAST TECHNIQUE: Multidetector CT imaging of the abdomen and pelvis was performed using the standard protocol following bolus administration of intravenous contrast. CONTRAST:  OMNIPAQUE IOHEXOL 300 MG/ML  SOLN COMPARISON:  None. FINDINGS: Evaluation is limited due to streak artifact caused by patient's arms. Lower chest: Patchy area of consolidative change involving the right lung base and portion of the right middle lobe with air bronchogram may represent atelectasis or infiltrate. Clinical correlation is  recommended. There is no intra-abdominal free air or free fluid. Hepatobiliary: The liver is unremarkable. No intrahepatic biliary ductal dilatation. Multiple stones noted within the gallbladder. No pericholecystic fluid  or evidence of acute cholecystitis by CT. Pancreas: Unremarkable. No pancreatic ductal dilatation or surrounding inflammatory changes. Spleen: Normal in size without focal abnormality. Adrenals/Urinary Tract: The adrenal glands unremarkable. There is no hydronephrosis on either side. There is symmetric enhancement and excretion of contrast by both kidneys. There is a 1 cm exophytic hypodense lesion from the upper pole of the right kidney which is suboptimally characterized but likely represents a cyst. The visualized ureters appear unremarkable. The urinary bladder is collapsed. Stomach/Bowel: There is moderate stool throughout the colon. There is no bowel obstruction or active inflammation. The appendix is not visualized with certainty. No inflammatory changes identified in the right lower quadrant. Vascular/Lymphatic: Mild aortoiliac atherosclerotic disease. The IVC is unremarkable. No portal venous gas. There is no adenopathy. Reproductive: The uterus is anteverted. Multiple fibroids measure up to 4.5 cm from the posterior uterus. No adnexal masses. Other: There is a 4 x 8 x 12 cm complex collection with small pockets of air in the superficial subcutaneous soft tissues of the left buttock along the inferior aspect of the gluteus maximus consistent with an infected collection or abscess. This collection and extends to the coccyx. There is slight fragmentation of the coccygeal bone most concerning for osteomyelitis. A smaller complex collection with pockets of air noted superficial to the sacrum to the right of the midline measuring approximately 0.6 x 1.2 cm. There is ulceration of the skin over this collection. Musculoskeletal: Osteopenia with degenerative changes of the spine. Fragmentation of  the distal sacrum/coccygeal bone as above most concerning for osteomyelitis. IMPRESSION: 1. 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 Clinical correlation is recommended. 2. Cholelithiasis. 3. No bowel obstruction. 4. Aortic Atherosclerosis (ICD10-I70.0). 5. Right lung base atelectasis or pneumonia. Electronically Signed   By: Anner Crete M.D.   On: 12/03/2019 18:52   DG Chest Port 1 View  Result Date: 12/03/2019 CLINICAL DATA:  Altered mental status. EXAM: PORTABLE CHEST 1 VIEW COMPARISON:  Single-view of the chest 09/18/2019 and 09/07/2019. FINDINGS: Elevation of the right hemidiaphragm relative to the left is new since the prior examinations and likely due to a subpulmonic effusion. Associated basilar airspace disease is noted. The left lung is expanded and clear. No left effusion. Heart size is normal. IMPRESSION: Findings most suggestive of a subpulmonic right pleural effusion with associated airspace disease which could be due to atelectasis or pneumonia. Electronically Signed   By: Inge Rise M.D.   On: 12/03/2019 13:47    ROS - all of the below systems have been reviewed with the patient and positives are indicated with bold text Significantly limited to obtain due to baseline confusion  PE Blood pressure (!) 102/59, pulse (!) 105, temperature (!) 102.2 F (39 C), temperature source Rectal, resp. rate (!) 22, height 5\' 6"  (1.676 m), weight 110 kg, SpO2 97 %. Constitutional: NAD; intermittently conversant; no deformities Eyes: Moist conjunctiva; no lid lag; anicteric; pupils equal and round Neck: Trachea midline; no thyromegaly Lungs: Normal respiratory effort; no tactile fremitus CV: irreg rate/rhythm; no palpable thrills; + pitting edema in lower ext GI: Abd obese, soft, nontender/nondistended; no palpable hepatosplenomegaly MSK: Unable to assess range of  motion of extremities due to condition; no clubbing/cyanosis. Sacrum - right buttock with large open wound ~10 x 10 cm covered in granulation tissue in deep base - extends to muscle. Left buttock without skin changes or erythema or clear fluctuance per se. Psychiatric:  Appropriate affect; alert and oriented x3 Lymphatic: No palpable cervical or axillary lymphadenopathy  Results for orders placed or performed during the hospital encounter of 12/03/19 (from the past 48 hour(s))  CBG monitoring, ED     Status: Abnormal   Collection Time: 12/03/19 12:54 PM  Result Value Ref Range   Glucose-Capillary 125 (H) 70 - 99 mg/dL    Comment: Glucose reference range applies only to samples taken after fasting for at least 8 hours.   Comment 1 Document in Chart   Lactic acid, plasma     Status: None   Collection Time: 12/03/19  1:32 PM  Result Value Ref Range   Lactic Acid, Venous 1.5 0.5 - 1.9 mmol/L    Comment: Performed at [facility], [address], [place], [place] [address]  Comprehensive metabolic panel     Status: Abnormal   Collection Time: 12/03/19  1:32 PM  Result Value Ref Range   Sodium 139 135 - 145 mmol/L   Potassium 4.5 3.5 - 5.1 mmol/L   Chloride 94 (L) 98 - 111 mmol/L   CO2 35 (H) 22 - 32 mmol/L   Glucose, Bld 139 (H) 70 - 99 mg/dL    Comment: Glucose reference range applies only to samples taken after fasting for at least 8 hours.   BUN 13 8 - 23 mg/dL   Creatinine, Ser 4.07 0.44 - 1.00 mg/dL   Calcium 8.6 (L) 8.9 - 10.3 mg/dL   Total Protein 6.6 6.5 - 8.1 g/dL   Albumin 1.5 (L) 3.5 - 5.0 g/dL   AST 45 (H) 15 - 41 U/L   ALT 43 0 - 44 U/L   Alkaline Phosphatase 70 38 - 126 U/L   Total Bilirubin 0.3 0.3 - 1.2 mg/dL   GFR calc non Af Amer >60 >60 mL/min   GFR calc Af Amer >60 >60 mL/min   Anion gap 10 5 - 15    Comment: Performed at [facility], [address] [place], [place] [address]  CBC WITH DIFFERENTIAL     Status: Abnormal   Collection Time: 12/03/19   1:32 PM  Result Value Ref Range   WBC 12.6 (H) 4.0 - 10.5 K/uL   RBC 3.77 (L) 3.87 - 5.11 MIL/uL   Hemoglobin 10.4 (L) 12.0 - 15.0 g/dL   HCT 11.0 31.5 - 94.5 %   MCV 97.6 80.0 - 100.0 fL   MCH 27.6 26.0 - 34.0 pg   MCHC 28.3 (L) 30.0 - 36.0 g/dL   RDW 85.9 (H) 29.2 - 44.6 %   Platelets 323 150 - 400 K/uL   nRBC 0.3 (H) 0.0 - 0.2 %   Neutrophils Relative % 85 %   Neutro Abs 10.8 (H) 1.7 - 7.7 K/uL   Lymphocytes Relative 6 %   Lymphs Abs 0.8 0.7 - 4.0 K/uL   Monocytes Relative 5 %   Monocytes Absolute 0.6 0.1 - 1.0 K/uL   Eosinophils Relative 1 %   Eosinophils Absolute 0.1 0.0 - 0.5 K/uL   Basophils Relative 1 %   Basophils Absolute 0.1 0.0 - 0.1 K/uL   Immature Granulocytes 2 %   Abs Immature Granulocytes 0.21 (H) 0.00 - 0.07 K/uL    Comment: Performed at [facility], [address] [place], [place] [address]  APTT     Status: None   Collection Time: 12/03/19  1:32 PM  Result Value Ref Range   aPTT 35 24 - 36 seconds  Comment: Performed at [facility], [address] [place], [place] [address]  Protime-INR     Status: Abnormal   Collection Time: 12/03/19  1:32 PM  Result Value Ref Range   Prothrombin Time 19.7 (H) 11.4 - 15.2 seconds   INR 1.7 (H) 0.8 - 1.2    Comment: (NOTE) INR goal varies based on device and disease states. Performed at [facility], [address] [place], [place] [address]   Lactic acid, plasma     Status: None   Collection Time: 12/03/19  4:55 PM  Result Value Ref Range   Lactic Acid, Venous 1.5 0.5 - 1.9 mmol/L    Comment: Performed at [facility], [address] [place], [place] [address]  Urinalysis, Routine w reflex microscopic     Status: Abnormal   Collection Time: 12/03/19  5:00 PM  Result Value Ref Range   Color, Urine YELLOW YELLOW   APPearance CLEAR CLEAR   Specific Gravity, Urine 1.017 1.005 - 1.030   pH 5.0 5.0 - 8.0   Glucose, UA NEGATIVE NEGATIVE mg/dL   Hgb urine dipstick NEGATIVE NEGATIVE    Bilirubin Urine NEGATIVE NEGATIVE   Ketones, ur 5 (A) NEGATIVE mg/dL   Protein, ur NEGATIVE NEGATIVE mg/dL   Nitrite NEGATIVE NEGATIVE   Leukocytes,Ua NEGATIVE NEGATIVE    Comment: Performed at [facility], [address] [place], [place] [address]    CT ABDOMEN PELVIS W CONTRAST  Result Date: 12/03/2019 CLINICAL DATA:  74 year old female with abdominal pain. Concern for abscess or infection. EXAM: CT ABDOMEN AND PELVIS WITH CONTRAST TECHNIQUE: Multidetector CT imaging of the abdomen and pelvis was performed using the standard protocol following bolus administration of intravenous contrast. CONTRAST:  100mL OMNIPAQUE IOHEXOL 300 MG/ML  SOLN COMPARISON:  None. FINDINGS: Evaluation is limited due to streak artifact caused by patient's arms. Lower chest: Patchy area of consolidative change involving the right lung base and portion of the right middle lobe with air bronchogram may represent atelectasis or infiltrate. Clinical correlation is recommended. There is no intra-abdominal free air or free fluid. Hepatobiliary: The liver is unremarkable. No intrahepatic biliary ductal dilatation. Multiple stones noted within the gallbladder. No pericholecystic fluid or evidence of acute cholecystitis by CT. Pancreas: Unremarkable. No pancreatic ductal dilatation or surrounding inflammatory changes. Spleen: Normal in size without focal abnormality. Adrenals/Urinary Tract: The adrenal glands unremarkable. There is no hydronephrosis on either side. There is symmetric enhancement and excretion of contrast by both kidneys. There is a 1 cm exophytic hypodense lesion from the upper pole of the right kidney which is suboptimally characterized but likely represents a cyst. The visualized ureters appear unremarkable. The urinary bladder is collapsed. Stomach/Bowel: There is moderate stool throughout the colon. There is no bowel obstruction or active inflammation. The appendix is not visualized with certainty. No  inflammatory changes identified in the right lower quadrant. Vascular/Lymphatic: Mild aortoiliac atherosclerotic disease. The IVC is unremarkable. No portal venous gas. There is no adenopathy. Reproductive: The uterus is anteverted. Multiple fibroids measure up to 4.5 cm from the posterior uterus. No adnexal masses. Other: There is a 4 x 8 x 12 cm complex collection with small pockets of air in the superficial subcutaneous soft tissues of the left buttock along the inferior aspect of the gluteus maximus consistent with an infected collection or abscess. This collection and extends to the coccyx. There is slight fragmentation of the coccygeal bone most concerning for osteomyelitis. A smaller complex collection with  pockets of air noted superficial to the sacrum to the right of the midline measuring approximately 0.6 x 1.2 cm. There is ulceration of the skin over this collection. Musculoskeletal: Osteopenia with degenerative changes of the spine. Fragmentation of the distal sacrum/coccygeal bone as above most concerning for osteomyelitis. IMPRESSION: 1. 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 Clinical correlation is recommended. 2. Cholelithiasis. 3. No bowel obstruction. 4. Aortic Atherosclerosis (ICD10-I70.0). 5. Right lung base atelectasis or pneumonia. Electronically Signed   By: Elgie Collard M.D.   On: 12/03/2019 18:52   DG Chest Port 1 View  Result Date: 12/03/2019 CLINICAL DATA:  Altered mental status. EXAM: PORTABLE CHEST 1 VIEW COMPARISON:  Single-view of the chest 09/18/2019 and 09/07/2019. FINDINGS: Elevation of the right hemidiaphragm relative to the left is new since the prior examinations and likely due to a subpulmonic effusion. Associated basilar airspace disease is noted. The left lung is expanded and clear. No left effusion. Heart size is normal. IMPRESSION: Findings  most suggestive of a subpulmonic right pleural effusion with associated airspace disease which could be due to atelectasis or pneumonia. Electronically Signed   By: Drusilla Kanner M.D.   On: 12/03/2019 13:47     A/P: Becky Hendricks is an 74 y.o. female with HTN, DM, HLD, Afib (on eliquis), decubitus ulcers, malnutrition - COVID pneumonia Jan - Feb of this year, hospitalized at [facility] - developed decubitus ulcers and has been undergoing wound care at facilities - now with CT (which I have reviewed) showing left gluteal abscess/collection and findings concerning for osteomyelitis  -Collection on CT is deep and there are no skin changes or clear fluctuance per se on exam -IV abx as per medicine - broad spec and osteomyelitis coverage -Hold Eliquis if able, ok with heparin gtt if needed/indicated. Clearance for general anesthesia; if unable, may need percutaneous drainage or bedside I&D pending Eliquis wearing off -We will follow with you  Stephanie Coup. Cliffton Asters, M.D. [facility]

## 2019-12-03 NOTE — ED Triage Notes (Signed)
Patient presents from [facility] by [employer]  -staff reports that patient was difficult to arouse -SBP was 70s -Has sacral wound (taking Rocephine X 1 week) -foul smelling urine -recurrent UTI -2lit [place] at baseline -confusion at baseline -Staff denies any fever -hx: AFIB

## 2019-12-04 ENCOUNTER — Inpatient Hospital Stay (HOSPITAL_COMMUNITY): Payer: Medicare Other | Admitting: Certified Registered"

## 2019-12-04 ENCOUNTER — Encounter (HOSPITAL_COMMUNITY)
Admission: EM | Disposition: A | Discharge status: Skilled Nursing Facility | Payer: Self-pay | Source: Skilled Nursing Facility | Attending: Internal Medicine

## 2019-12-04 DIAGNOSIS — G9341 Metabolic encephalopathy: Secondary | ICD-10-CM

## 2019-12-04 DIAGNOSIS — L0231 Cutaneous abscess of buttock: Secondary | ICD-10-CM

## 2019-12-04 DIAGNOSIS — A419 Sepsis, unspecified organism: Principal | ICD-10-CM

## 2019-12-04 DIAGNOSIS — S31000A Unspecified open wound of lower back and pelvis without penetration into retroperitoneum, initial encounter: Secondary | ICD-10-CM

## 2019-12-04 DIAGNOSIS — R6520 Severe sepsis without septic shock: Secondary | ICD-10-CM

## 2019-12-04 HISTORY — PX: INCISION AND DRAINAGE PERIRECTAL ABSCESS: SHX1804

## 2019-12-04 LAB — POCT I-STAT 7, (LYTES, BLD GAS, ICA,H+H)
Acid-Base Excess: 9 mmol/L — ABNORMAL HIGH (ref 0.0–2.0)
Bicarbonate: 36.4 mmol/L — ABNORMAL HIGH (ref 20.0–28.0)
Calcium, Ion: 1.35 mmol/L (ref 1.15–1.40)
HCT: 26 % — ABNORMAL LOW (ref 36.0–46.0)
Hemoglobin: 8.8 g/dL — ABNORMAL LOW (ref 12.0–15.0)
O2 Saturation: 99 %
Potassium: 3.8 mmol/L (ref 3.5–5.1)
Sodium: 141 mmol/L (ref 135–145)
TCO2: 38 mmol/L — ABNORMAL HIGH (ref 22–32)
pCO2 arterial: 65.7 mmHg (ref 32.0–48.0)
pH, Arterial: 7.351 (ref 7.350–7.450)
pO2, Arterial: 178 mmHg — ABNORMAL HIGH (ref 83.0–108.0)

## 2019-12-04 LAB — BASIC METABOLIC PANEL
Anion gap: 8 (ref 5–15)
BUN: 10 mg/dL (ref 8–23)
CO2: 32 mmol/L (ref 22–32)
Calcium: 7.9 mg/dL — ABNORMAL LOW (ref 8.9–10.3)
Chloride: 100 mmol/L (ref 98–111)
Creatinine, Ser: 0.41 mg/dL — ABNORMAL LOW (ref 0.44–1.00)
GFR calc Af Amer: >60 mL/min (ref >60)
GFR calc non Af Amer: >60 mL/min (ref >60)
Glucose, Bld: 139 mg/dL — ABNORMAL HIGH (ref 70–99)
Potassium: 4.8 mmol/L (ref 3.5–5.1)
Sodium: 140 mmol/L (ref 135–145)

## 2019-12-04 LAB — BLOOD CULTURE ID PANEL (REFLEXED)

## 2019-12-04 LAB — CBC
HCT: 34.7 % — ABNORMAL LOW (ref 36.0–46.0)
Hemoglobin: 9.5 g/dL — ABNORMAL LOW (ref 12.0–15.0)
MCH: 27.2 pg (ref 26.0–34.0)
MCHC: 27.4 g/dL — ABNORMAL LOW (ref 30.0–36.0)
MCV: 99.4 fL (ref 80.0–100.0)
Platelets: 328 10*3/uL (ref 150–400)
RBC: 3.49 MIL/uL — ABNORMAL LOW (ref 3.87–5.11)
RDW: 15.8 % — ABNORMAL HIGH (ref 11.5–15.5)
WBC: 12.9 10*3/uL — ABNORMAL HIGH (ref 4.0–10.5)
nRBC: 0 % (ref 0.0–0.2)

## 2019-12-04 LAB — RESPIRATORY PANEL BY RT PCR (FLU A&B, COVID)
Influenza A by PCR: NEGATIVE
Influenza B by PCR: NEGATIVE
SARS Coronavirus 2 by RT PCR: NEGATIVE

## 2019-12-04 LAB — GLUCOSE, CAPILLARY
Glucose-Capillary: 133 mg/dL — ABNORMAL HIGH (ref 70–99)
Glucose-Capillary: 104 mg/dL — ABNORMAL HIGH (ref 70–99)
Glucose-Capillary: 260 mg/dL — ABNORMAL HIGH (ref 70–99)
Glucose-Capillary: 251 mg/dL — ABNORMAL HIGH (ref 70–99)

## 2019-12-04 LAB — MAGNESIUM: Magnesium: 1.7 mg/dL (ref 1.7–2.4)

## 2019-12-04 LAB — MRSA PCR SCREENING: MRSA by PCR: POSITIVE — AB

## 2019-12-04 LAB — CBG MONITORING, ED: Glucose-Capillary: 136 mg/dL — ABNORMAL HIGH (ref 70–99)

## 2019-12-04 SURGERY — INCISION AND DRAINAGE, ABSCESS, PERIRECTAL
Anesthesia: General | Site: Buttocks | Laterality: Left

## 2019-12-04 MED ORDER — EPHEDRINE SULFATE-NACL 50-0.9 MG/10ML-% IV SOSY
PREFILLED_SYRINGE | INTRAVENOUS | Status: DC | PRN
  Administered 2019-12-04 (×3): 10 mg via INTRAVENOUS

## 2019-12-04 MED ORDER — 0.9 % SODIUM CHLORIDE (POUR BTL) OPTIME
TOPICAL | Status: DC | PRN
  Administered 2019-12-04: 10:00:00 1000 mL

## 2019-12-04 MED ORDER — CALCIUM CHLORIDE 10 % IV SOLN
INTRAVENOUS | Status: DC | PRN
Start: 2019-12-04 — End: 2019-12-04
  Administered 2019-12-04: 1 g via INTRAVENOUS

## 2019-12-04 MED ORDER — ALBUMIN HUMAN 5 % IV SOLN: INTRAVENOUS | Status: DC | PRN

## 2019-12-04 MED ORDER — PROPOFOL 10 MG/ML IV BOLUS
INTRAVENOUS | Status: DC | PRN
  Administered 2019-12-04: 100 mg via INTRAVENOUS

## 2019-12-04 MED ORDER — IPRATROPIUM-ALBUTEROL 0.5-2.5 (3) MG/3ML IN SOLN
3.0000 mL | Freq: Two times a day (BID) | RESPIRATORY_TRACT | Status: DC
  Administered 2019-12-05 (×2): 3 mL via RESPIRATORY_TRACT
  Filled 2019-12-04 (×3): qty 3

## 2019-12-04 MED ORDER — VASOPRESSIN 20 UNIT/ML IV SOLN
INTRAVENOUS | Status: AC
  Filled 2019-12-04: qty 1

## 2019-12-04 MED ORDER — LACTATED RINGERS IV SOLN: INTRAVENOUS | Status: DC | PRN

## 2019-12-04 MED ORDER — ONDANSETRON HCL 4 MG/2ML IJ SOLN
INTRAMUSCULAR | Status: DC | PRN
  Administered 2019-12-04: 4 mg via INTRAVENOUS

## 2019-12-04 MED ORDER — FENTANYL CITRATE (PF) 100 MCG/2ML IJ SOLN
INTRAMUSCULAR | Status: DC | PRN
  Administered 2019-12-04: 100 ug via INTRAVENOUS

## 2019-12-04 MED ORDER — LIDOCAINE 2% (20 MG/ML) 5 ML SYRINGE
INTRAMUSCULAR | Status: DC | PRN
  Administered 2019-12-04: 60 mg via INTRAVENOUS

## 2019-12-04 MED ORDER — PROPOFOL 10 MG/ML IV BOLUS
INTRAVENOUS | Status: AC
  Filled 2019-12-04: qty 20

## 2019-12-04 MED ORDER — FENTANYL CITRATE (PF) 250 MCG/5ML IJ SOLN
INTRAMUSCULAR | Status: AC
  Filled 2019-12-04: qty 5

## 2019-12-04 MED ORDER — SUGAMMADEX SODIUM 200 MG/2ML IV SOLN
INTRAVENOUS | Status: DC | PRN
  Administered 2019-12-04: 220 mg via INTRAVENOUS

## 2019-12-04 MED ORDER — MAGNESIUM SULFATE 2 GM/50ML IV SOLN
2.0000 g | Freq: Once | INTRAVENOUS | Status: AC
  Administered 2019-12-04: 2 g via INTRAVENOUS
  Filled 2019-12-04: qty 50

## 2019-12-04 MED ORDER — DEXAMETHASONE SODIUM PHOSPHATE 10 MG/ML IJ SOLN
INTRAMUSCULAR | Status: DC | PRN
  Administered 2019-12-04: 10 mg via INTRAVENOUS

## 2019-12-04 MED ORDER — ONDANSETRON HCL 4 MG/2ML IJ SOLN
INTRAMUSCULAR | Status: AC
  Filled 2019-12-04: qty 2

## 2019-12-04 MED ORDER — EPHEDRINE 5 MG/ML INJ
INTRAVENOUS | Status: AC
  Filled 2019-12-04: qty 10

## 2019-12-04 MED ORDER — DEXAMETHASONE SODIUM PHOSPHATE 10 MG/ML IJ SOLN
INTRAMUSCULAR | Status: AC
  Filled 2019-12-04: qty 1

## 2019-12-04 MED ORDER — ONDANSETRON HCL 4 MG/2ML IJ SOLN: 4.0000 mg | Freq: Once | INTRAMUSCULAR | Status: DC | PRN

## 2019-12-04 MED ORDER — VASOPRESSIN 20 UNIT/ML IV SOLN
INTRAVENOUS | Status: DC | PRN
  Administered 2019-12-04 (×3): 1 [IU] via INTRAVENOUS

## 2019-12-04 MED ORDER — PHENYLEPHRINE HCL-NACL 10-0.9 MG/250ML-% IV SOLN: 0.0000 ug/min | INTRAVENOUS | Status: DC

## 2019-12-04 MED ORDER — STERILE WATER FOR IRRIGATION IR SOLN
Status: DC | PRN
  Administered 2019-12-04: 1000 mL

## 2019-12-04 MED ORDER — FENTANYL CITRATE (PF) 100 MCG/2ML IJ SOLN: 25.0000 ug | INTRAMUSCULAR | Status: DC | PRN

## 2019-12-04 MED ORDER — ROCURONIUM BROMIDE 50 MG/5ML IV SOSY
PREFILLED_SYRINGE | INTRAVENOUS | Status: DC | PRN
  Administered 2019-12-04: 50 mg via INTRAVENOUS

## 2019-12-04 MED ORDER — PHENYLEPHRINE 40 MCG/ML (10ML) SYRINGE FOR IV PUSH (FOR BLOOD PRESSURE SUPPORT)
PREFILLED_SYRINGE | INTRAVENOUS | Status: DC | PRN
  Administered 2019-12-04 (×5): 80 ug via INTRAVENOUS

## 2019-12-04 MED ORDER — PHENYLEPHRINE HCL-NACL 10-0.9 MG/250ML-% IV SOLN
INTRAVENOUS | Status: DC | PRN
  Administered 2019-12-04: 50 ug/min via INTRAVENOUS

## 2019-12-04 SURGICAL SUPPLY — 35 items
BNDG GAUZE ELAST 4 BULKY (GAUZE/BANDAGES/DRESSINGS) ×2 IMPLANT
CANISTER SUCT 3000ML PPV (MISCELLANEOUS) ×3 IMPLANT
COVER SURGICAL LIGHT HANDLE (MISCELLANEOUS) ×3 IMPLANT
DRAPE LAPAROSCOPIC ABDOMINAL (DRAPES) ×2 IMPLANT
DRAPE UTILITY XL STRL (DRAPES) ×3 IMPLANT
DRSG PAD ABDOMINAL 8X10 ST (GAUZE/BANDAGES/DRESSINGS) ×3 IMPLANT
ELECT REM PT RETURN 9FT ADLT (ELECTROSURGICAL) ×3
ELECTRODE REM PT RTRN 9FT ADLT (ELECTROSURGICAL) IMPLANT
GLOVE BIO SURGEON STRL SZ7 (GLOVE) ×2 IMPLANT
GLOVE BIO SURGEON STRL SZ8 (GLOVE) ×3 IMPLANT
GLOVE BIOGEL PI IND STRL 6 (GLOVE) IMPLANT
GLOVE BIOGEL PI IND STRL 7.0 (GLOVE) IMPLANT
GLOVE BIOGEL PI IND STRL 8 (GLOVE) ×1 IMPLANT
GLOVE BIOGEL PI INDICATOR 6 (GLOVE) ×2
GLOVE BIOGEL PI INDICATOR 7.0 (GLOVE) ×2
GLOVE BIOGEL PI INDICATOR 8 (GLOVE) ×2
GOWN STRL REUS W/ TWL LRG LVL3 (GOWN DISPOSABLE) ×2 IMPLANT
GOWN STRL REUS W/ TWL XL LVL3 (GOWN DISPOSABLE) ×1 IMPLANT
GOWN STRL REUS W/TWL LRG LVL3 (GOWN DISPOSABLE) ×2
GOWN STRL REUS W/TWL XL LVL3 (GOWN DISPOSABLE) ×2
KIT BASIN OR (CUSTOM PROCEDURE TRAY) ×3 IMPLANT
KIT TURNOVER KIT B (KITS) ×3 IMPLANT
NS IRRIG 1000ML POUR BTL (IV SOLUTION) ×3 IMPLANT
PACK LITHOTOMY IV (CUSTOM PROCEDURE TRAY) ×3 IMPLANT
PAD ABD 8X10 STRL (GAUZE/BANDAGES/DRESSINGS) ×2 IMPLANT
PAD ARMBOARD 7.5X6 YLW CONV (MISCELLANEOUS) ×6 IMPLANT
PENCIL SMOKE EVACUATOR (MISCELLANEOUS) ×2 IMPLANT
SOLUTION BETADINE 4OZ (MISCELLANEOUS) ×2 IMPLANT
SPONGE LAP 18X18 X RAY DECT (DISPOSABLE) ×2 IMPLANT
SWAB COLLECTION DEVICE MRSA (MISCELLANEOUS) ×3 IMPLANT
SWAB CULTURE ESWAB REG 1ML (MISCELLANEOUS) ×3 IMPLANT
TOWEL GREEN STERILE FF (TOWEL DISPOSABLE) ×3 IMPLANT
TUBE CONNECTING 12'X1/4 (SUCTIONS) ×1
TUBE CONNECTING 12X1/4 (SUCTIONS) ×2 IMPLANT
YANKAUER SUCT BULB TIP NO VENT (SUCTIONS) ×3 IMPLANT

## 2019-12-04 NOTE — Progress Notes (Signed)
Patient arrived to room 2w04 from ED.  Assessment complete, VS obtained, and Admission database began. °

## 2019-12-04 NOTE — ED Notes (Signed)
Pt resting comfortably in bed, repositioned for comfort measures. Call bell in reach, VSS. NAD. Verbalizes no complaints/needs at this time.  

## 2019-12-04 NOTE — Progress Notes (Addendum)
Subjective/Chief Complaint: Reports buttock pain   Objective: Vital signs in last 24 hours: Temp:  [97.9 F (36.6 C)-102.2 F (39 C)] 98.7 F (37.1 C) (04/23 0720) Pulse Rate:  [72-105] 88 (04/23 0755) Resp:  [16-49] 16 (04/23 0755) BP: (97-136)/(56-74) 101/66 (04/23 0720) SpO2:  [93 %-100 %] 97 % (04/23 0755) Weight:  [110 kg] 110 kg (04/22 1256)    Intake/Output from previous day: 04/22 0701 - 04/23 0700 In: 950 [I.V.:850; IV Piggyback:100] Out: 450 [Urine:450] Intake/Output this shift: No intake/output data recorded.  General appearance: cooperative Resp: clear to auscultation bilaterally GI: soft, NT Neurologic: Mental status: arouses and F/C Buttocks: wound R buttock packed and mostly clean, L buttock tender fluctuance without skin change Lab Results:  Recent Labs    12/03/19 1332 12/04/19 0326  WBC 12.6* 12.9*  HGB 10.4* 9.5*  HCT 36.8 34.7*  PLT 323 328   BMET Recent Labs    12/03/19 1332 12/04/19 0326  NA 139 140  K 4.5 4.8  CL 94* 100  CO2 35* 32  GLUCOSE 139* 139*  BUN 13 10  CREATININE 0.47 0.41*  CALCIUM 8.6* 7.9*   PT/INR Recent Labs    12/03/19 1332  LABPROT 19.7*  INR 1.7*   ABG No results for input(s): PHART, HCO3 in the last 72 hours.  Invalid input(s): PCO2, PO2  Studies/Results: CT HEAD WO CONTRAST  Result Date: 12/03/2019 CLINICAL DATA:  Altered mental status EXAM: CT HEAD WITHOUT CONTRAST TECHNIQUE: Contiguous axial images were obtained from the base of the skull through the vertex without intravenous contrast. COMPARISON:  09/07/2019 FINDINGS: Brain: There is atrophy and chronic small vessel disease changes. No acute intracranial abnormality. Specifically, no hemorrhage, hydrocephalus, mass lesion, acute infarction, or significant intracranial injury. Vascular: No hyperdense vessel or unexpected calcification. Skull: No acute calvarial abnormality. Sinuses/Orbits: Visualized paranasal sinuses and mastoids clear. Orbital  soft tissues unremarkable. Other: None IMPRESSION: Atrophy, chronic microvascular disease. No acute intracranial abnormality. Electronically Signed   By: Rolm Baptise M.D.   On: 12/03/2019 22:32   CT ABDOMEN PELVIS W CONTRAST  Result Date: 12/03/2019 CLINICAL DATA:  74 year old female with abdominal pain. Concern for abscess or infection. EXAM: CT ABDOMEN AND PELVIS WITH CONTRAST TECHNIQUE: Multidetector CT imaging of the abdomen and pelvis was performed using the standard protocol following bolus administration of intravenous contrast. CONTRAST:  158mL OMNIPAQUE IOHEXOL 300 MG/ML  SOLN COMPARISON:  None. FINDINGS: Evaluation is limited due to streak artifact caused by patient's arms. Lower chest: Patchy area of consolidative change involving the right lung base and portion of the right middle lobe with air bronchogram may represent atelectasis or infiltrate. Clinical correlation is recommended. There is no intra-abdominal free air or free fluid. Hepatobiliary: The liver is unremarkable. No intrahepatic biliary ductal dilatation. Multiple stones noted within the gallbladder. No pericholecystic fluid or evidence of acute cholecystitis by CT. Pancreas: Unremarkable. No pancreatic ductal dilatation or surrounding inflammatory changes. Spleen: Normal in size without focal abnormality. Adrenals/Urinary Tract: The adrenal glands unremarkable. There is no hydronephrosis on either side. There is symmetric enhancement and excretion of contrast by both kidneys. There is a 1 cm exophytic hypodense lesion from the upper pole of the right kidney which is suboptimally characterized but likely represents a cyst. The visualized ureters appear unremarkable. The urinary bladder is collapsed. Stomach/Bowel: There is moderate stool throughout the colon. There is no bowel obstruction or active inflammation. The appendix is not visualized with certainty. No inflammatory changes identified in the right lower quadrant.  Vascular/Lymphatic: Mild aortoiliac atherosclerotic disease. The IVC is unremarkable. No portal venous gas. There is no adenopathy. Reproductive: The uterus is anteverted. Multiple fibroids measure up to 4.5 cm from the posterior uterus. No adnexal masses. Other: There is a 4 x 8 x 12 cm complex collection with small pockets of air in the superficial subcutaneous soft tissues of the left buttock along the inferior aspect of the gluteus maximus consistent with an infected collection or abscess. This collection and extends to the coccyx. There is slight fragmentation of the coccygeal bone most concerning for osteomyelitis. A smaller complex collection with pockets of air noted superficial to the sacrum to the right of the midline measuring approximately 0.6 x 1.2 cm. There is ulceration of the skin over this collection. Musculoskeletal: Osteopenia with degenerative changes of the spine. Fragmentation of the distal sacrum/coccygeal bone as above most concerning for osteomyelitis. IMPRESSION: 1. 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 Clinical correlation is recommended. 2. Cholelithiasis. 3. No bowel obstruction. 4. Aortic Atherosclerosis (ICD10-I70.0). 5. Right lung base atelectasis or pneumonia. Electronically Signed   By: Elgie Collard M.D.   On: 12/03/2019 18:52   DG Chest Port 1 View  Result Date: 12/03/2019 CLINICAL DATA:  Altered mental status. EXAM: PORTABLE CHEST 1 VIEW COMPARISON:  Single-view of the chest 09/18/2019 and 09/07/2019. FINDINGS: Elevation of the right hemidiaphragm relative to the left is new since the prior examinations and likely due to a subpulmonic effusion. Associated basilar airspace disease is noted. The left lung is expanded and clear. No left effusion. Heart size is normal. IMPRESSION: Findings most suggestive of a subpulmonic right pleural effusion with  associated airspace disease which could be due to atelectasis or pneumonia. Electronically Signed   By: Drusilla Kanner M.D.   On: 12/03/2019 13:47    Anti-infectives: Anti-infectives (From admission, onward)   Start     Dose/Rate Route Frequency Ordered Stop   12/04/19 1430  vancomycin (VANCOREADY) IVPB 1500 mg/300 mL     1,500 mg 150 mL/hr over 120 Minutes Intravenous Every 24 hours 12/03/19 1435     12/03/19 2130  ceFEPIme (MAXIPIME) 2 g in sodium chloride 0.9 % 100 mL IVPB     2 g 200 mL/hr over 30 Minutes Intravenous Every 8 hours 12/03/19 1435     12/03/19 1330  ceFEPIme (MAXIPIME) 2 g in sodium chloride 0.9 % 100 mL IVPB     2 g 200 mL/hr over 30 Minutes Intravenous  Once 12/03/19 1307 12/03/19 1559   12/03/19 1315  metroNIDAZOLE (FLAGYL) IVPB 500 mg     500 mg 100 mL/hr over 60 Minutes Intravenous  Once 12/03/19 1307 12/03/19 1559   12/03/19 1315  vancomycin (VANCOCIN) IVPB 1000 mg/200 mL premix  Status:  Discontinued     1,000 mg 200 mL/hr over 60 Minutes Intravenous  Once 12/03/19 1307 12/03/19 1311   12/03/19 1315  vancomycin (VANCOCIN) 2,500 mg in sodium chloride 0.9 % 500 mL IVPB     2,500 mg 250 mL/hr over 120 Minutes Intravenous  Once 12/03/19 1311 12/03/19 1658      Assessment/Plan: L buttock abscess - to OR for incision and drainage. I discussed this with her. I also called her brother and reviewed the procedure, risks, and benefits. He agreed and we did phone consent. On Vanc/Maxipime. Eliquis has been held.  LOS: 1 day    Becky Hendricks 12/04/2019

## 2019-12-04 NOTE — Anesthesia Procedure Notes (Signed)
Procedure Name: Intubation Date/Time: 12/04/2019 9:57 AM Performed by: Rosiland Oz, CRNA Pre-anesthesia Checklist: Patient identified, Emergency Drugs available, Suction available, Patient being monitored and Timeout performed Patient Re-evaluated:Patient Re-evaluated prior to induction Oxygen Delivery Method: Circle system utilized Preoxygenation: Pre-oxygenation with 100% oxygen Induction Type: IV induction Ventilation: Mask ventilation without difficulty Laryngoscope Size: Miller and 2 Grade View: Grade I Tube type: Oral Tube size: 7.0 mm Number of attempts: 1 Airway Equipment and Method: Stylet Placement Confirmation: ETT inserted through vocal cords under direct vision and positive ETCO2 Secured at: 21 cm Tube secured with: Tape Dental Injury: Teeth and Oropharynx as per pre-operative assessment

## 2019-12-04 NOTE — Op Note (Addendum)
  12/04/2019  10:29 AM  PATIENT:  Becky Hendricks  74 y.o. female  PRE-OPERATIVE DIAGNOSIS:  Left buttock abscess  POST-OPERATIVE DIAGNOSIS:  Left buttock abscess  PROCEDURE:  Procedure(s): INCISION AND DRAINAGE LARGE LEFT BUTTOCK ABSCESS  SURGEON:  Surgeon(s): Violeta Gelinas, MD  ASSISTANTS: none   ANESTHESIA:   general  EBL:  Total I/O In: 250 [IV Piggyback:250] Out: -   BLOOD ADMINISTERED:none  DRAINS: none   SPECIMEN:  No Specimen  DISPOSITION OF SPECIMEN:  N/A  COUNTS:  YES  DICTATION: .Dragon Dictation Procedure in detail: Informed consent was obtained.  She is currently on IV antibiotics.  She was brought to the operating room and general endotracheal anesthesia was administered by the anesthesia staff.  She was placed in lateral position with left side up.  She was appropriately padded and positioned.  Buttock area was prepped and draped in sterile fashion.  I made a transverse incision using cautery over the fluctuant area on her left buttock.  I dissected down and entered a large cavity with purulent material.  This was sent for culture.  It extended laterally and also extended cephalad.  Therefore I made a second incision which was also transverse several centimeters cephalad.  All loculations within this cavity were broken up and the area was copiously irrigated.  Hemostasis was ensured.  I then packed both of the wounds with saline soaked Kerlix.  I also placed saline soaked Kerlix and her chronically open right buttock wound.  All counts were correct.  There were no apparent complications.  She was taken recovery in stable condition. PATIENT DISPOSITION:  PACU - hemodynamically stable.   Delay start of Pharmacological VTE agent (>24hrs) due to surgical blood loss or risk of bleeding:  Resume tomorrow  Violeta Gelinas, MD, MPH, FACS Pager: [phone]  4/23/202110:29 AM

## 2019-12-04 NOTE — Anesthesia Preprocedure Evaluation (Addendum)
Anesthesia Evaluation  Patient identified by MRN, date of birth, ID band Patient awake    Reviewed: Allergy & Precautions, NPO status , Patient's Chart, lab work & pertinent test results  Airway Mallampati: III  TM Distance: >3 FB Neck ROM: Full    Dental no notable dental hx.    Pulmonary neg pulmonary ROS,    Pulmonary exam normal breath sounds clear to auscultation       Cardiovascular hypertension, Pt. on medications and Pt. on home beta blockers Normal cardiovascular exam+ dysrhythmias Atrial Fibrillation  Rhythm:Regular Rate:Normal  ECG: ST, rate 98   Neuro/Psych PSYCHIATRIC DISORDERS Bipolar Disorder negative neurological ROS     GI/Hepatic negative GI ROS, Neg liver ROS,   Endo/Other  diabetes, Insulin DependentMorbid obesity  Renal/GU negative Renal ROS     Musculoskeletal negative musculoskeletal ROS (+)   Abdominal (+) + obese,   Peds  Hematology  (+) anemia , HLD   Anesthesia Other Findings Left buttock abscess  Reproductive/Obstetrics                            Anesthesia Physical Anesthesia Plan  ASA: III  Anesthesia Plan: General   Post-op Pain Management:    Induction: Intravenous  PONV Risk Score and Plan: 3 and Ondansetron, Dexamethasone, Midazolam and Treatment may vary due to age or medical condition  Airway Management Planned: Oral ETT  Additional Equipment:   Intra-op Plan:   Post-operative Plan: Extubation in OR  Informed Consent: I have reviewed the patients History and Physical, chart, labs and discussed the procedure including the risks, benefits and alternatives for the proposed anesthesia with the patient or authorized representative who has indicated his/her understanding and acceptance.     Dental advisory given  Plan Discussed with: CRNA  Anesthesia Plan Comments:        Anesthesia Quick Evaluation

## 2019-12-04 NOTE — Progress Notes (Signed)
Patient extubated at bedside in PACU by Dr. Bradley Ferris at 1225. Patient placed on Non-rebreather mask. Patient is able to follow commands and is oriented to person.

## 2019-12-04 NOTE — Anesthesia Postprocedure Evaluation (Signed)
Anesthesia Post Note  Patient: Becky Hendricks  Procedure(s) Performed: IRRIGATION AND DEBRIDEMENT LEFT BUTTOCK ABSCESS (Left Buttocks)     Patient location during evaluation: PACU Anesthesia Type: General Level of consciousness: awake and alert Pain management: pain level controlled Vital Signs Assessment: post-procedure vital signs reviewed and stable Respiratory status: spontaneous breathing, nonlabored ventilation, respiratory function stable and patient connected to nasal cannula oxygen Cardiovascular status: blood pressure returned to baseline and stable Postop Assessment: no apparent nausea or vomiting Anesthetic complications: no Comments: Patient awake and following commands, then extubated in the PACU.    Last Vitals:  Vitals:   12/04/19 1300 12/04/19 1325  BP: (!) 111/56 (!) 111/59  Pulse: 85 85  Resp: 12 20  Temp: 36.8 C 36.6 C  SpO2: 94% 96%    Last Pain:  Vitals:   12/04/19 1325  TempSrc: Oral  PainSc: 0-No pain                 Carmel Garfield P Corley Maffeo

## 2019-12-04 NOTE — Progress Notes (Signed)
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  Systolics were in the 70s at the facility.  She has a known chronic sacral wound for which she has been receiving Rocephin for 1 week.  Her urine was noted to be foul-smelling and she has a history of recurrent UTIs.  In the ED she was found to have a temperature of 100.2.  She was tachycardic and tachypneic up to 40 with systolics in the 90s.  UA was not consistent with infection.  She was noted to have a very large deep sacral wound with purulent drainage.  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  Significant Events: 4/22 admit via [facility] from [facility] 4/23 I&D left buttock abscess in OR  Antimicrobials:  Cefepime 4/22 > Flagyl 4/22 > Vancomycin 4/22 >  Subjective: Vital signs appear to be stabilizing with improvement in blood pressure post resuscitation.  The patient is not febrile.  CBGs are well controlled.  Patient is seen postop in her room.  She is resting comfortably with no signs of distress.  She is somnolent.  Assessment & Plan:  Severe sepsis POA - buttock abscess/osteomyelitis of the coccyx General surgery following -volume resuscitation ongoing -empiric antibiotics being dosed -now status post I&D -continue empiric broad antibiotic coverage -surgical cultures were sent  Possible HCAP CT scan abdomen raised the question of a possible right middle and lower lobe infiltrate -patient is not hypoxic on her baseline oxygen support  -broaden antibiotic being used for abscess should provide adequate coverage -suspect this is likely simply atelectasis  Acute metabolic encephalopathy versus chronic cognitive deficit Baseline mental status not clear but reportedly the patient is confused at baseline-follow as patient begins to recover from anesthesia  Paroxysmal atrial fibrillation Eliquis on hold -BB on hold given hypotension -monitor on telemetry  Normocytic anemia Likely due to chronic disease/smoldering infection -monitor trend postop  Bipolar disorder Prolonged QT noted on EKG therefore quetiapine on hold -continue Depakote  History of HTN Hypotensive at presentation -blood pressure well controlled at present without medications  Morbid obesity - Body mass index is 39.14 kg/m.  HLD Continue Lipitor  DM 2 -uncontrolled with hyperglycemia A1c 7.8 reflecting poor control in outpatient setting -keep CBG less than 180  QT prolongation Noted on admission EKG in ED -continue telemetry monitoring -discontinue quetiapine -monitor  DVT prophylaxis: SCDs with pending surgery Code Status: FULL CODE Family Communication:  Disposition Plan:   Consultants:  none  Objective: Blood pressure 101/66, pulse 86, temperature 98.7 F (37.1 C), temperature source Oral, resp. rate 16, height 5\' 6"  (1.676 m), weight 110 kg, SpO2 93 %.  Intake/Output Summary (Last 24 hours) at 12/04/2019 0725 Last data filed at 12/04/2019 0600 Gross per 24 hour  Intake 950 ml  Output 450 ml  Net 500 ml   Filed Weights   12/03/19 1256  Weight: 110 kg    Examination: General: No acute respiratory distress -somnolent Lungs: Clear to auscultation bilaterally without wheezes or crackles Cardiovascular: Regular rate and rhythm without murmur gallop or rub normal  S1 and S2 Abdomen: Nondistended, soft, bowel sounds positive, no rebound, no ascites, no appreciable mass Extremities: Trace bilateral lower extremity edema  CBC: Recent Labs    Lab 12/03/19 1332 12/04/19 0326  WBC 12.6* 12.9*  NEUTROABS 10.8*  --   HGB 10.4* 9.5*  HCT 36.8 34.7*  MCV 97.6 99.4  PLT 323 328   Basic Metabolic Panel: Recent Labs  Lab 12/03/19 1332 12/04/19 0326  NA 139 140  K 4.5 4.8  CL 94* 100  CO2 35* 32  GLUCOSE 139* 139*  BUN 13 10  CREATININE 0.47 0.41*  CALCIUM 8.6* 7.9*  MG 1.7  --    GFR: Estimated Creatinine Clearance: 81.1 mL/min (A) (by C-G formula based on SCr of 0.41 mg/dL (L)).  Liver Function Tests: Recent Labs  Lab 12/03/19 1332  AST 45*  ALT 43  ALKPHOS 70  BILITOT 0.3  PROT 6.6  ALBUMIN 1.5*    Coagulation Profile: Recent Labs  Lab 12/03/19 1332  INR 1.7*    HbA1C: Hgb A1c MFr Bld  Date/Time Value Ref Range Status  12/03/2019 11:20 PM 7.8 (H) 4.8 - 5.6 % Final    Comment:    (NOTE) Pre diabetes:          5.7%-6.4% Diabetes:              >6.4% Glycemic control for   <7.0% adults with diabetes   09/07/2019 07:50 PM 9.5 (H) 4.8 - 5.6 % Final    Comment:    (NOTE) Pre diabetes:          5.7%-6.4% Diabetes:              >6.4% Glycemic control for   <7.0% adults with diabetes     CBG: Recent Labs  Lab 12/03/19 1254 12/04/19 0426 12/04/19 0717  GLUCAP 125* 136* 133*    Recent Results (from the past 240 hour(s))  Respiratory Panel by RT PCR (Flu A&B, Covid) - Nasopharyngeal Swab     Status: None   Collection Time: 12/03/19 11:17 PM   Specimen: Nasopharyngeal Swab  Result Value Ref Range Status   SARS Coronavirus 2 by RT PCR NEGATIVE NEGATIVE Final    Comment: (NOTE) SARS-CoV-2 target nucleic acids are NOT DETECTED. The SARS-CoV-2 RNA is generally detectable in upper respiratoy specimens during the acute phase of infection. The lowest concentration of SARS-CoV-2 viral copies this assay can detect is 131 copies/mL. A negative result does not preclude SARS-Cov-2 infection and should not be used as the sole basis for treatment or other patient management decisions. A negative  result may occur with  improper specimen collection/handling, submission of specimen other than nasopharyngeal swab, presence of viral mutation(s) within the areas targeted by this assay, and inadequate number of viral copies (<131 copies/mL). A negative result must be combined with clinical observations, patient history, and epidemiological information. The expected result is Negative. Fact Sheet for Patients:  https://www.moore.com/ Fact Sheet for Healthcare Providers:  https://www.young.biz/ This test is not yet ap proved or cleared by the [place] FDA and  has been authorized for detection and/or diagnosis of SARS-CoV-2 by FDA under an Emergency Use Authorization (EUA). This EUA will remain  in effect (meaning this test can be used) for the duration of the COVID-19 declaration under Section 564(b)(1) of the Act, 21 U.S.C. section 360bbb-3(b)(1), unless the authorization is terminated or revoked sooner.    Influenza A by PCR NEGATIVE NEGATIVE Final   Influenza B by PCR NEGATIVE NEGATIVE Final  Comment: (NOTE) The Xpert Xpress SARS-CoV-2/FLU/RSV assay is intended as an aid in  the diagnosis of influenza from Nasopharyngeal swab specimens and  should not be used as a sole basis for treatment. Nasal washings and  aspirates are unacceptable for Xpert Xpress SARS-CoV-2/FLU/RSV  testing. Fact Sheet for Patients: PinkCheek.be Fact Sheet for Healthcare Providers: GravelBags.it This test is not yet approved or cleared by the [place] FDA and  has been authorized for detection and/or diagnosis of SARS-CoV-2 by  FDA under an Emergency Use Authorization (EUA). This EUA will remain  in effect (meaning this test can be used) for the duration of the  Covid-19 declaration under Section 564(b)(1) of the Act, 21  U.S.C. section 360bbb-3(b)(1), unless the authorization is  terminated or  revoked. Performed at [facility], [place] [address], [place], [place] [address]      Scheduled Meds: . ascorbic acid  500 mg Oral BID  . atorvastatin  10 mg Oral Daily  . calcium carbonate  1 tablet Oral Daily  . cholecalciferol  1,000 Units Oral Daily  . divalproex  125 mg Oral QHS  . divalproex  500 mg Oral Daily  . insulin aspart  0-15 Units Subcutaneous TID WC  . insulin glargine  13 Units Subcutaneous Daily  . ipratropium-albuterol  3 mL Inhalation Q6H  . senna-docusate  1 tablet Oral Daily   Continuous Infusions: . sodium chloride 125 mL/hr at 12/03/19 2312  . ceFEPime (MAXIPIME) IV 2 g (12/04/19 1025)  . magnesium sulfate bolus IVPB    . vancomycin       LOS: 1 day   Cherene Altes, MD [facility] Office  [phone] Pager - Text Page per Amion  If 7PM-7AM, please contact night-coverage per Amion 12/04/2019, 7:25 AM

## 2019-12-04 NOTE — Progress Notes (Signed)
PHARMACY - PHYSICIAN COMMUNICATION CRITICAL VALUE ALERT - BLOOD CULTURE IDENTIFICATION (BCID)  Becky Hendricks is an 74 y.o. female who presented to [facility] on 12/03/2019 with a chief complaint of altered mental status and hypotension. She has a sacral wound that she has been receiving Rocephin x1 week for prior to presentation. 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 She is going to the OR for I&D 4/23. CT also showing patchy area of consolidative change involving the right lung base and portion of the right middle lobe with air bronchogram, may represent atelectasis or infiltrate.  Assessment:  1/3 bottles positive (anaerobic bottle) with strep species  Name of physician (or Provider) Contacted: Dr. Sharon Seller   Current antibiotics: Vancomycin and cefepime   Changes to prescribed antibiotics recommended:  Recommendations accepted by provider  No changes for now, follow up culture results from OR  Results for orders placed or performed during the hospital encounter of 12/03/19  Blood Culture ID Panel (Reflexed) (Collected: 12/03/2019  1:15 PM)  Result Value Ref Range   Enterococcus species NOT DETECTED NOT DETECTED   Listeria monocytogenes NOT DETECTED NOT DETECTED   Staphylococcus species NOT DETECTED NOT DETECTED   Staphylococcus aureus (BCID) NOT DETECTED NOT DETECTED   Streptococcus species DETECTED (A) NOT DETECTED   Streptococcus agalactiae NOT DETECTED NOT DETECTED   Streptococcus pneumoniae NOT DETECTED NOT DETECTED   Streptococcus pyogenes NOT DETECTED NOT DETECTED   Acinetobacter baumannii NOT DETECTED NOT DETECTED   Enterobacteriaceae species NOT DETECTED NOT DETECTED   Enterobacter cloacae complex NOT DETECTED NOT DETECTED   Escherichia coli NOT DETECTED NOT DETECTED   Klebsiella oxytoca NOT DETECTED NOT DETECTED   Klebsiella pneumoniae  NOT DETECTED NOT DETECTED   Proteus species NOT DETECTED NOT DETECTED   Serratia marcescens NOT DETECTED NOT DETECTED   Haemophilus influenzae NOT DETECTED NOT DETECTED   Neisseria meningitidis NOT DETECTED NOT DETECTED   Pseudomonas aeruginosa NOT DETECTED NOT DETECTED   Candida albicans NOT DETECTED NOT DETECTED   Candida glabrata NOT DETECTED NOT DETECTED   Candida krusei NOT DETECTED NOT DETECTED   Candida parapsilosis NOT DETECTED NOT DETECTED   Candida tropicalis NOT DETECTED NOT DETECTED    Jeannetta Nap 12/04/2019  10:18 AM

## 2019-12-04 NOTE — Transfer of Care (Signed)
Immediate Anesthesia Transfer of Care Note  Patient: Becky Hendricks  Procedure(s) Performed: IRRIGATION AND DEBRIDEMENT LEFT BUTTOCK ABSCESS (Left Buttocks)  Patient Location: PACU  Anesthesia Type:General  Level of Consciousness: drowsy, patient cooperative and Patient remains intubated per anesthesia plan  Airway & Oxygen Therapy: Patient remains intubated per anesthesia plan and Patient placed on Ventilator (see vital sign flow sheet for setting)  Post-op Assessment: Report given to RN and Post -op Vital signs reviewed and stable  Post vital signs: Reviewed and stable  Last Vitals:  Vitals Value Taken Time  BP 150/58 12/04/19 1140  Temp    Pulse 68 12/04/19 1145  Resp 18 12/04/19 1145  SpO2 93 % 12/04/19 1145  Vitals shown include unvalidated device data.  Last Pain:  Vitals:   12/04/19 1140  TempSrc:   PainSc: 0-No pain         Complications: No apparent anesthesia complications

## 2019-12-05 LAB — COMPREHENSIVE METABOLIC PANEL
ALT: 23 U/L (ref 0–44)
AST: 16 U/L (ref 15–41)
Albumin: 1.5 g/dL — ABNORMAL LOW (ref 3.5–5.0)
Alkaline Phosphatase: 51 U/L (ref 38–126)
Anion gap: 6 (ref 5–15)
BUN: 16 mg/dL (ref 8–23)
CO2: 37 mmol/L — ABNORMAL HIGH (ref 22–32)
Calcium: 8.6 mg/dL — ABNORMAL LOW (ref 8.9–10.3)
Chloride: 100 mmol/L (ref 98–111)
Creatinine, Ser: 0.41 mg/dL — ABNORMAL LOW (ref 0.44–1.00)
GFR calc Af Amer: >60 mL/min (ref >60)
GFR calc non Af Amer: >60 mL/min (ref >60)
Glucose, Bld: 181 mg/dL — ABNORMAL HIGH (ref 70–99)
Potassium: 4.3 mmol/L (ref 3.5–5.1)
Sodium: 143 mmol/L (ref 135–145)
Total Bilirubin: 0.5 mg/dL (ref 0.3–1.2)
Total Protein: 5.6 g/dL — ABNORMAL LOW (ref 6.5–8.1)

## 2019-12-05 LAB — CBC
HCT: 30.2 % — ABNORMAL LOW (ref 36.0–46.0)
Hemoglobin: 8.5 g/dL — ABNORMAL LOW (ref 12.0–15.0)
MCH: 27.3 pg (ref 26.0–34.0)
MCHC: 28.1 g/dL — ABNORMAL LOW (ref 30.0–36.0)
MCV: 97.1 fL (ref 80.0–100.0)
Platelets: 317 10*3/uL (ref 150–400)
RBC: 3.11 MIL/uL — ABNORMAL LOW (ref 3.87–5.11)
RDW: 15.6 % — ABNORMAL HIGH (ref 11.5–15.5)
WBC: 10.3 10*3/uL (ref 4.0–10.5)
nRBC: 0 % (ref 0.0–0.2)

## 2019-12-05 LAB — GLUCOSE, CAPILLARY
Glucose-Capillary: 137 mg/dL — ABNORMAL HIGH (ref 70–99)
Glucose-Capillary: 176 mg/dL — ABNORMAL HIGH (ref 70–99)
Glucose-Capillary: 162 mg/dL — ABNORMAL HIGH (ref 70–99)
Glucose-Capillary: 84 mg/dL (ref 70–99)
Glucose-Capillary: 97 mg/dL (ref 70–99)

## 2019-12-05 LAB — MAGNESIUM: Magnesium: 2 mg/dL (ref 1.7–2.4)

## 2019-12-05 LAB — URINE CULTURE: Culture: NO GROWTH

## 2019-12-05 LAB — PHOSPHORUS: Phosphorus: 2.3 mg/dL — ABNORMAL LOW (ref 2.5–4.6)

## 2019-12-05 MED ORDER — INSULIN ASPART 100 UNIT/ML ~~LOC~~ SOLN
0.0000 [IU] | Freq: Three times a day (TID) | SUBCUTANEOUS | Status: DC
  Administered 2019-12-05 (×2): 4 [IU] via SUBCUTANEOUS
  Administered 2019-12-07: 3 [IU] via SUBCUTANEOUS
  Administered 2019-12-07: 4 [IU] via SUBCUTANEOUS
  Administered 2019-12-08: 7 [IU] via SUBCUTANEOUS
  Administered 2019-12-08: 11 [IU] via SUBCUTANEOUS
  Administered 2019-12-08: 3 [IU] via SUBCUTANEOUS
  Administered 2019-12-09: 7 [IU] via SUBCUTANEOUS
  Administered 2019-12-09: 3 [IU] via SUBCUTANEOUS
  Administered 2019-12-09 – 2019-12-10 (×2): 4 [IU] via SUBCUTANEOUS
  Administered 2019-12-10: 3 [IU] via SUBCUTANEOUS
  Administered 2019-12-11 (×2): 4 [IU] via SUBCUTANEOUS
  Administered 2019-12-11: 3 [IU] via SUBCUTANEOUS
  Administered 2019-12-12 – 2019-12-13 (×2): 4 [IU] via SUBCUTANEOUS
  Administered 2019-12-13: 7 [IU] via SUBCUTANEOUS
  Administered 2019-12-13 – 2019-12-14 (×2): 3 [IU] via SUBCUTANEOUS
  Administered 2019-12-14 – 2019-12-15 (×3): 4 [IU] via SUBCUTANEOUS
  Administered 2019-12-15 – 2019-12-16 (×2): 3 [IU] via SUBCUTANEOUS
  Administered 2019-12-16: 4 [IU] via SUBCUTANEOUS
  Administered 2019-12-17 (×2): 7 [IU] via SUBCUTANEOUS
  Administered 2019-12-17: 4 [IU] via SUBCUTANEOUS

## 2019-12-05 MED ORDER — INSULIN GLARGINE 100 UNIT/ML ~~LOC~~ SOLN
18.0000 [IU] | Freq: Every day | SUBCUTANEOUS | Status: DC
  Administered 2019-12-05: 18 [IU] via SUBCUTANEOUS
  Filled 2019-12-05 (×2): qty 0.18

## 2019-12-05 MED ORDER — INSULIN ASPART 100 UNIT/ML ~~LOC~~ SOLN
0.0000 [IU] | Freq: Every day | SUBCUTANEOUS | Status: DC
  Administered 2019-12-08: 2 [IU] via SUBCUTANEOUS
  Administered 2019-12-12: 3 [IU] via SUBCUTANEOUS

## 2019-12-05 MED ORDER — METOPROLOL TARTRATE 5 MG/5ML IV SOLN
5.0000 mg | Freq: Three times a day (TID) | INTRAVENOUS | Status: DC
  Administered 2019-12-05 – 2019-12-09 (×12): 5 mg via INTRAVENOUS
  Filled 2019-12-05 (×13): qty 5

## 2019-12-05 MED ORDER — DILTIAZEM LOAD VIA INFUSION
15.0000 mg | Freq: Once | INTRAVENOUS | Status: DC
  Filled 2019-12-05: qty 15

## 2019-12-05 MED ORDER — OXYCODONE HCL 5 MG PO TABS
5.0000 mg | ORAL_TABLET | ORAL | Status: DC | PRN
  Administered 2019-12-05 – 2019-12-14 (×10): 10 mg via ORAL
  Filled 2019-12-05 (×10): qty 2

## 2019-12-05 MED ORDER — ACETAMINOPHEN 325 MG PO TABS
650.0000 mg | ORAL_TABLET | Freq: Four times a day (QID) | ORAL | Status: DC | PRN
  Administered 2019-12-05: 650 mg via ORAL
  Filled 2019-12-05 (×2): qty 2

## 2019-12-05 MED ORDER — DILTIAZEM HCL-DEXTROSE 125-5 MG/125ML-% IV SOLN (PREMIX)
5.0000 mg/h | INTRAVENOUS | Status: DC
  Filled 2019-12-05: qty 125

## 2019-12-05 MED ORDER — FENTANYL CITRATE (PF) 100 MCG/2ML IJ SOLN
12.5000 ug | INTRAMUSCULAR | Status: DC | PRN
  Administered 2019-12-05 – 2019-12-10 (×6): 12.5 ug via INTRAVENOUS
  Filled 2019-12-05 (×6): qty 2

## 2019-12-05 MED ORDER — METRONIDAZOLE IN NACL 5-0.79 MG/ML-% IV SOLN
500.0000 mg | Freq: Three times a day (TID) | INTRAVENOUS | Status: DC
  Administered 2019-12-05 – 2019-12-09 (×12): 500 mg via INTRAVENOUS
  Filled 2019-12-05 (×12): qty 100

## 2019-12-05 MED ORDER — METOPROLOL TARTRATE 5 MG/5ML IV SOLN
10.0000 mg | Freq: Once | INTRAVENOUS | Status: AC
  Administered 2019-12-05: 10 mg via INTRAVENOUS
  Filled 2019-12-05: qty 10

## 2019-12-05 MED ORDER — TRAMADOL HCL 50 MG PO TABS: 50.0000 mg | ORAL_TABLET | Freq: Four times a day (QID) | ORAL | Status: DC | PRN

## 2019-12-05 MED ORDER — HYDROCODONE-ACETAMINOPHEN 5-325 MG PO TABS
1.0000 | ORAL_TABLET | Freq: Four times a day (QID) | ORAL | Status: DC | PRN
  Administered 2019-12-05 (×2): 1 via ORAL
  Filled 2019-12-05 (×2): qty 1

## 2019-12-05 NOTE — Consult Note (Signed)
WOC consulted for assistance with wound care orders and skin care.  Will evaluate wounds and skin 12/06/19 when WOC nurse on the campus.   Jakyron Fabro [facility], CNS, [employer] [phone]

## 2019-12-05 NOTE — Plan of Care (Signed)

## 2019-12-05 NOTE — Progress Notes (Signed)
Ok to add metronidazole back for sacral wound/abscess per Dr. Sharon Seller.  Flagyl 500mg  IV q8  , PharmD, [place], AAHIVP, CPP Infectious Disease Pharmacist 12/05/2019 1:44 PM

## 2019-12-05 NOTE — Progress Notes (Signed)
Patient having a lot of pain this AM after wound dressing change. Went into A fib and hr up to the 150's. Yellow mews popped up. Orders received from MD. After metoprolol given BP lightly dropped to 98/78 MD paged and received new orders. Patient spontaneously converted back to NSR at 1234. EKG obtained for confirmation. RN will continue to monitor patient.

## 2019-12-05 NOTE — Progress Notes (Signed)
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  Systolics were in the 15Q at the facility.  She has a known chronic sacral wound for which she has been receiving Rocephin for 1 week.  Her urine was noted to be foul-smelling and she has a history of recurrent UTIs.  In the ED she was found to have a temperature of 100.2.  She was tachycardic and tachypneic up to 40 with systolics in the 00Q.  UA was not consistent with infection.  She was noted to have a very large deep sacral wound with purulent drainage.  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  Significant Events: 4/22 admit via [place] from [facility] 4/23 I&D left buttock abscess in OR  Antimicrobials:  Cefepime 4/22 > Flagyl 4/22 > Vancomycin 4/22 >  Subjective: Experiencing uncontrolled pain, and tachycardia this morning. Is awake and conversant but confused and not oriented. Will not consistently follow simple commands.  Experienced sustained run of atrial fibrillation requiring IV metoprolol.  Unable to provide a detailed history given altered mental status.  Assessment & Plan:  Severe sepsis POA - buttock abscess/osteomyelitis of the coccyx -gram-positive bacteremia General surgery following -volume resuscitation completed - empiric antibiotics being dosed -now status post I&D -continue empiric broad antibiotic coverage -surgical cultures brewing - sespsis physiology essentially resolved at this time   Possible HCAP CT scan abdomen raised the  question of a possible right middle and lower lobe infiltrate -patient is not hypoxic on her baseline oxygen support -broad antibiotic being used for abscess should provide adequate coverage -suspect this is likely simply atelectasis  Acute metabolic encephalopathy versus chronic cognitive deficit Baseline mental status not clear but reportedly the patient is confused at baseline - assess Q76 and folic acid as well as ammonia   Paroxysmal atrial fibrillation With holding of beta-blocker patient experienced persistent episode of A. fib with RVR this morning -dosing of IV Lopressor helped control her heart rate -begin scheduled IV Lopressor -consider IV Cardizem drip if RVR recurs -blood pressure now stable  Normocytic anemia Likely due to chronic disease/smoldering infection -monitor trend postop  Bipolar disorder Prolonged QT noted on EKG therefore quetiapine on hold -continue Depakote  History of HTN Hypotensive at presentation -blood pressure well controlled at present without medications  Morbid obesity - Body mass index is 39.14 kg/m.  HLD Continue Lipitor  DM 2 -uncontrolled with hyperglycemia A1c 7.8 reflecting poor control in outpatient setting -CBG climbing requiring adjustment of insulin regimen  QT prolongation Noted on admission EKG in ED -continue telemetry monitoring -discontinue quetiapine -monitor  MRSA screen +  DVT prophylaxis: SCDs with pending surgery Code Status: FULL CODE Family Communication: No family present at time of exam Disposition Plan: Patient lives in SNF and will need to return to this environment when medically stable  Consultants:  none  Objective: Blood pressure 113/67, pulse 87, temperature 97.8 F (36.6 C), temperature source Oral, resp. rate (!) 21, height 5\' 6"  (1.676 m), weight 110 kg, SpO2 (!) 64 %.  Intake/Output Summary (Last 24  hours) at 12/05/2019 0755 Last data filed at 12/05/2019 0700 Gross per 24 hour  Intake 2490 ml  Output  625 ml  Net 1865 ml   Filed Weights   12/03/19 1256  Weight: 110 kg    Examination: General: No acute respiratory distress -alert but altered and unable to give history Lungs: Distant breath sounds with no wheezing Cardiovascular: Irregular irregular and tachycardic at 150 bpm at time of exam Abdomen: Nondistended, soft, bowel sounds positive, no rebound, no ascites, no appreciable mass Extremities: Trace edema B LE  CBC: Recent Labs  Lab 12/03/19 1332 12/03/19 1332 12/04/19 0326 12/04/19 1129 12/05/19 0519  WBC 12.6*  --  12.9*  --  10.3  NEUTROABS 10.8*  --   --   --   --   HGB 10.4*   < > 9.5* 8.8* 8.5*  HCT 36.8   < > 34.7* 26.0* 30.2*  MCV 97.6  --  99.4  --  97.1  PLT 323  --  328  --  317   < > = values in this interval not displayed.   Basic Metabolic Panel: Recent Labs  Lab 12/03/19 1332 12/03/19 1332 12/04/19 0326 12/04/19 1129 12/05/19 0519  NA 139   < > 140 141 143  K 4.5   < > 4.8 3.8 4.3  CL 94*  --  100  --  100  CO2 35*  --  32  --  37*  GLUCOSE 139*  --  139*  --  181*  BUN 13  --  10  --  16  CREATININE 0.47  --  0.41*  --  0.41*  CALCIUM 8.6*  --  7.9*  --  8.6*  MG 1.7  --   --   --  2.0  PHOS  --   --   --   --  2.3*   < > = values in this interval not displayed.   GFR: Estimated Creatinine Clearance: 81.1 mL/min (A) (by C-G formula based on SCr of 0.41 mg/dL (L)).  Liver Function Tests: Recent Labs  Lab 12/03/19 1332 12/05/19 0519  AST 45* 16  ALT 43 23  ALKPHOS 70 51  BILITOT 0.3 0.5  PROT 6.6 5.6*  ALBUMIN 1.5* 1.5*    Coagulation Profile: Recent Labs  Lab 12/03/19 1332  INR 1.7*    HbA1C: Hgb A1c MFr Bld  Date/Time Value Ref Range Status  12/03/2019 11:20 PM 7.8 (H) 4.8 - 5.6 % Final    Comment:    (NOTE) Pre diabetes:          5.7%-6.4% Diabetes:              >6.4% Glycemic control for   <7.0% adults with diabetes   09/07/2019 07:50 PM 9.5 (H) 4.8 - 5.6 % Final    Comment:    (NOTE) Pre diabetes:           5.7%-6.4% Diabetes:              >6.4% Glycemic control for   <7.0% adults with diabetes     CBG: Recent Labs  Lab 12/04/19 0426 12/04/19 0717 12/04/19 1148 12/04/19 1758 12/04/19 2052  GLUCAP 136* 133* 104* 260* 251*    Recent Results (from the past 240 hour(s))  Blood Culture (routine x 2)     Status: None (Preliminary result)   Collection Time: 12/03/19  1:15 PM   Specimen: BLOOD  Result Value Ref Range Status   Specimen Description BLOOD  RIGHT ANTECUBITAL  Final   Special Requests   Final    BOTTLES DRAWN AEROBIC AND ANAEROBIC Blood Culture adequate volume   Culture  Setup Time   Final    GRAM POSITIVE COCCI IN CHAINS ANAEROBIC BOTTLE ONLY Organism ID to follow CRITICAL RESULT CALLED TO, READ BACK BY AND VERIFIED WITH: Gala Lewandowsky. Baumeister PharmD 10:15 12/04/19 ([email or web address])    Culture   Final    NO GROWTH < 24 HOURS Performed at [facility], [address] [place], [place] [address]    Report Status PENDING  Incomplete  Blood Culture ID Panel (Reflexed)     Status: Abnormal   Collection Time: 12/03/19  1:15 PM  Result Value Ref Range Status   Enterococcus species NOT DETECTED NOT DETECTED Final   Listeria monocytogenes NOT DETECTED NOT DETECTED Final   Staphylococcus species NOT DETECTED NOT DETECTED Final   Staphylococcus aureus (BCID) NOT DETECTED NOT DETECTED Final   Streptococcus species DETECTED (A) NOT DETECTED Final    Comment: Not Enterococcus species, Streptococcus agalactiae, Streptococcus pyogenes, or Streptococcus pneumoniae. CRITICAL RESULT CALLED TO, READ BACK BY AND VERIFIED WITH: Gala Lewandowsky. Baumeister PharmD 10:15 12/04/19 ([email or web address])    Streptococcus agalactiae NOT DETECTED NOT DETECTED Final   Streptococcus pneumoniae NOT DETECTED NOT DETECTED Final   Streptococcus pyogenes NOT DETECTED NOT DETECTED Final   Acinetobacter baumannii NOT DETECTED NOT DETECTED Final   Enterobacteriaceae species NOT DETECTED NOT DETECTED Final   Enterobacter cloacae complex  NOT DETECTED NOT DETECTED Final   Escherichia coli NOT DETECTED NOT DETECTED Final   Klebsiella oxytoca NOT DETECTED NOT DETECTED Final   Klebsiella pneumoniae NOT DETECTED NOT DETECTED Final   Proteus species NOT DETECTED NOT DETECTED Final   Serratia marcescens NOT DETECTED NOT DETECTED Final   Haemophilus influenzae NOT DETECTED NOT DETECTED Final   Neisseria meningitidis NOT DETECTED NOT DETECTED Final   Pseudomonas aeruginosa NOT DETECTED NOT DETECTED Final   Candida albicans NOT DETECTED NOT DETECTED Final   Candida glabrata NOT DETECTED NOT DETECTED Final   Candida krusei NOT DETECTED NOT DETECTED Final   Candida parapsilosis NOT DETECTED NOT DETECTED Final   Candida tropicalis NOT DETECTED NOT DETECTED Final    Comment: Performed at [facility], [address] [place], [place] [address]  Blood Culture (routine x 2)     Status: None (Preliminary result)   Collection Time: 12/03/19  1:33 PM   Specimen: BLOOD LEFT WRIST  Result Value Ref Range Status   Specimen Description BLOOD LEFT WRIST  Final   Special Requests   Final    BOTTLES DRAWN AEROBIC ONLY Blood Culture results may not be optimal due to an inadequate volume of blood received in culture bottles   Culture   Final    NO GROWTH < 24 HOURS Performed at [facility], [address] [place], [place] [address]    Report Status PENDING  Incomplete  Respiratory Panel by RT PCR (Flu A&B, Covid) - Nasopharyngeal Swab     Status: None   Collection Time: 12/03/19 11:17 PM   Specimen: Nasopharyngeal Swab  Result Value Ref Range Status   SARS Coronavirus 2 by RT PCR NEGATIVE NEGATIVE Final    Comment: (NOTE) SARS-CoV-2 target nucleic acids are NOT DETECTED. The SARS-CoV-2 RNA is generally detectable in upper respiratoy specimens during the acute phase of infection. The lowest concentration of SARS-CoV-2 viral copies this assay can detect is 131 copies/mL. A negative result does not preclude SARS-Cov-2 infection  and should not be used as the sole basis for treatment or other patient management decisions. A negative result may occur with  improper specimen collection/handling, submission of specimen other than nasopharyngeal swab, presence of viral mutation(s) within the areas targeted by this assay, and inadequate number of viral copies (<131 copies/mL). A negative result must be combined with clinical observations, patient history, and epidemiological information. The expected result is Negative. Fact Sheet for Patients:  https://www.moore.com/ Fact Sheet for Healthcare Providers:  https://www.young.biz/ This test is not yet ap proved or cleared by the [place] FDA and  has been authorized for detection and/or diagnosis of SARS-CoV-2 by FDA under an Emergency Use Authorization (EUA). This EUA will remain  in effect (meaning this test can be used) for the duration of the COVID-19 declaration under Section 564(b)(1) of the Act, 21 U.S.C. section 360bbb-3(b)(1), unless the authorization is terminated or revoked sooner.    Influenza A by PCR NEGATIVE NEGATIVE Final   Influenza B by PCR NEGATIVE NEGATIVE Final    Comment: (NOTE) The Xpert Xpress SARS-CoV-2/FLU/RSV assay is intended as an aid in  the diagnosis of influenza from Nasopharyngeal swab specimens and  should not be used as a sole basis for treatment. Nasal washings and  aspirates are unacceptable for Xpert Xpress SARS-CoV-2/FLU/RSV  testing. Fact Sheet for Patients: https://www.moore.com/ Fact Sheet for Healthcare Providers: https://www.young.biz/ This test is not yet approved or cleared by the [place] FDA and  has been authorized for detection and/or diagnosis of SARS-CoV-2 by  FDA under an Emergency Use Authorization (EUA). This EUA will remain  in effect (meaning this test can be used) for the duration of the  Covid-19 declaration under Section  564(b)(1) of the Act, 21  U.S.C. section 360bbb-3(b)(1), unless the authorization is  terminated or revoked. Performed at [facility], [address] [place], [place] [address]   MRSA PCR Screening     Status: Abnormal   Collection Time: 12/04/19  6:13 AM   Specimen: Nasal Mucosa; Nasopharyngeal  Result Value Ref Range Status   MRSA by PCR POSITIVE (A) NEGATIVE Final    Comment:        The GeneXpert MRSA Assay (FDA approved for NASAL specimens only), is one component of a comprehensive MRSA colonization surveillance program. It is not intended to diagnose MRSA infection nor to guide or monitor treatment for MRSA infections. RESULT CALLED TO, READ BACK BY AND VERIFIED WITH: Truman Hayward RN 8:40 12/04/19 ([email or web address]) Performed at [facility], [address], [place], [place] [address]   Aerobic/Anaerobic Culture (surgical/deep wound)     Status: None (Preliminary result)   Collection Time: 12/04/19 10:21 AM   Specimen: PATH Other; Tissue  Result Value Ref Range Status   Specimen Description TISSUE  Final   Special Requests LEFT BUTTOCK ABSCESS SPEC A  Final   Gram Stain   Final    RARE WBC PRESENT,BOTH PMN AND MONONUCLEAR NO ORGANISMS SEEN Performed at [facility], [address], [place], [place] [address]    Culture PENDING  Incomplete   Report Status PENDING  Incomplete     Scheduled Meds: . ascorbic acid  500 mg Oral BID  . atorvastatin  10 mg Oral Daily  . calcium carbonate  1 tablet Oral Daily  . cholecalciferol  1,000 Units Oral Daily  . divalproex  125 mg Oral QHS  . divalproex  500 mg Oral Daily  . insulin aspart  0-15 Units Subcutaneous TID WC  .  insulin glargine  13 Units Subcutaneous Daily  . ipratropium-albuterol  3 mL Inhalation BID  . senna-docusate  1 tablet Oral Daily   Continuous Infusions: . ceFEPime (MAXIPIME) IV 2 g (12/05/19 0527)  . vancomycin 1,500 mg (12/04/19 1352)     LOS: 2 days   Lonia Blood, MD [facility] Office  [phone] Pager - Text Page per Amion  If 7PM-7AM, please contact night-coverage per Amion 12/05/2019, 7:55 AM

## 2019-12-05 NOTE — Progress Notes (Addendum)
1 Day Post-Op    CC: Left buttocks abscess  Subjective: Patient is lying in bed she is uncomfortable with any movement.  She says she has not been out of bed in the nursing home.  She cannot tell me when she walked last.  Objective: Vital signs in last 24 hours: Temp:  [97.8 F (36.6 C)-98.7 F (37.1 C)] 97.8 F (36.6 C) (04/23 2048) Pulse Rate:  [61-90] 87 (04/24 0710) Resp:  [12-21] 21 (04/24 0710) BP: (91-150)/(55-80) 113/67 (04/23 2048) SpO2:  [64 %-100 %] 64 % (04/24 0710) FiO2 (%):  [40 %] 40 % (04/23 1220)    340 p.o. recorded 2100 IV 600 urine recorded Afebrile vital signs are stable WBC 12.6>> 12.9>> 10.3 Glucose 181 Creatinine 0.41 Intake/Output from previous day: 04/23 0701 - 04/24 0700 In: 2490 [P.O.:340; I.V.:1700; IV Piggyback:450] Out: 625 [Urine:600; Blood:25] Intake/Output this shift: No intake/output data recorded.  General appearance: alert, cooperative and She is uncomfortable with any movement. Skin: See picture below.  She also has skin irritation on both knees.  Lower sacral opening 5 x 5 x 4 cm; the 2 top incisions 5 x 1 x 2 cm  Lab Results:  Recent Labs    12/04/19 0326 12/04/19 0326 12/04/19 1129 12/05/19 0519  WBC 12.9*  --   --  10.3  HGB 9.5*   < > 8.8* 8.5*  HCT 34.7*   < > 26.0* 30.2*  PLT 328  --   --  317   < > = values in this interval not displayed.    BMET Recent Labs    12/04/19 0326 12/04/19 0326 12/04/19 1129 12/05/19 0519  NA 140   < > 141 143  K 4.8   < > 3.8 4.3  CL 100  --   --  100  CO2 32  --   --  37*  GLUCOSE 139*  --   --  181*  BUN 10  --   --  16  CREATININE 0.41*  --   --  0.41*  CALCIUM 7.9*  --   --  8.6*   < > = values in this interval not displayed.   PT/INR Recent Labs    12/03/19 1332  LABPROT 19.7*  INR 1.7*    Recent Labs  Lab 12/03/19 1332 12/05/19 0519  AST 45* 16  ALT 43 23  ALKPHOS 70 51  BILITOT 0.3 0.5  PROT 6.6 5.6*  ALBUMIN 1.5* 1.5*     Lipase  No results  found for: LIPASE   Medications: . ascorbic acid  500 mg Oral BID  . atorvastatin  10 mg Oral Daily  . calcium carbonate  1 tablet Oral Daily  . cholecalciferol  1,000 Units Oral Daily  . divalproex  125 mg Oral QHS  . divalproex  500 mg Oral Daily  . insulin aspart  0-15 Units Subcutaneous TID WC  . insulin glargine  13 Units Subcutaneous Daily  . ipratropium-albuterol  3 mL Inhalation BID  . senna-docusate  1 tablet Oral Daily   . ceFEPime (MAXIPIME) IV 2 g (12/05/19 0527)  . vancomycin 1,500 mg (12/04/19 1352)   Assessment/Plan Atrial fibrillation on Eliquis Baseline confusion/bipolar disorder Type 2 diabetes Chronic respiratory failure on home O2 Stage IV pressure ulcers buttocks Covid infection January 2021  Sepsis Left buttocks abscess I&D of left buttocks abscess 12/04/2019 Dr. Violeta Gelinas  FEN: Carb modified diet ID: Flagyl x1 dose 4/22; cefepime 4/22>> day 3; vancomycin 4/22>> day 3 DVT:  SCDs; Medicine can restart anticoagulation from our standpoint Follow-up: Wound care  Plan: Wet-to-dry dressings twice daily.  Will ask wound care to see patient and help with decubitus and other skin care issues.  Place her on air mattress.   LOS: 2 days    Kendle Turbin 12/05/2019 Please see Amion

## 2019-12-06 LAB — CBC
HCT: 30.6 % — ABNORMAL LOW (ref 36.0–46.0)
Hemoglobin: 8.7 g/dL — ABNORMAL LOW (ref 12.0–15.0)
MCH: 27.7 pg (ref 26.0–34.0)
MCHC: 28.4 g/dL — ABNORMAL LOW (ref 30.0–36.0)
MCV: 97.5 fL (ref 80.0–100.0)
Platelets: 310 10*3/uL (ref 150–400)
RBC: 3.14 MIL/uL — ABNORMAL LOW (ref 3.87–5.11)
RDW: 15.7 % — ABNORMAL HIGH (ref 11.5–15.5)
WBC: 10.1 10*3/uL (ref 4.0–10.5)
nRBC: 0.2 % (ref 0.0–0.2)

## 2019-12-06 LAB — COMPREHENSIVE METABOLIC PANEL WITH GFR
ALT: 19 U/L (ref 0–44)
AST: 16 U/L (ref 15–41)
Albumin: 1.4 g/dL — ABNORMAL LOW (ref 3.5–5.0)
Alkaline Phosphatase: 51 U/L (ref 38–126)
Anion gap: 7 (ref 5–15)
BUN: 14 mg/dL (ref 8–23)
CO2: 35 mmol/L — ABNORMAL HIGH (ref 22–32)
Calcium: 8.4 mg/dL — ABNORMAL LOW (ref 8.9–10.3)
Chloride: 100 mmol/L (ref 98–111)
Creatinine, Ser: 0.35 mg/dL — ABNORMAL LOW (ref 0.44–1.00)
GFR calc Af Amer: >60 mL/min
GFR calc non Af Amer: >60 mL/min
Glucose, Bld: 106 mg/dL — ABNORMAL HIGH (ref 70–99)
Potassium: 4 mmol/L (ref 3.5–5.1)
Sodium: 142 mmol/L (ref 135–145)
Total Bilirubin: 0.5 mg/dL (ref 0.3–1.2)
Total Protein: 5.4 g/dL — ABNORMAL LOW (ref 6.5–8.1)

## 2019-12-06 LAB — GLUCOSE, CAPILLARY
Glucose-Capillary: 92 mg/dL (ref 70–99)
Glucose-Capillary: 98 mg/dL (ref 70–99)
Glucose-Capillary: 94 mg/dL (ref 70–99)
Glucose-Capillary: 121 mg/dL — ABNORMAL HIGH (ref 70–99)

## 2019-12-06 LAB — FOLATE: Folate: 8.6 ng/mL (ref >5.9)

## 2019-12-06 LAB — CULTURE, BLOOD (ROUTINE X 2): Special Requests: ADEQUATE

## 2019-12-06 LAB — VITAMIN B12: Vitamin B-12: 453 pg/mL (ref 180–914)

## 2019-12-06 LAB — AMMONIA: Ammonia: 19 umol/L (ref 9–35)

## 2019-12-06 LAB — VANCOMYCIN, TROUGH: Vancomycin Tr: 15 ug/mL (ref 15–20)

## 2019-12-06 MED ORDER — SODIUM CHLORIDE 0.9% FLUSH: 10.0000 mL | INTRAVENOUS | Status: DC | PRN

## 2019-12-06 MED ORDER — HALOPERIDOL LACTATE 5 MG/ML IJ SOLN
5.0000 mg | Freq: Once | INTRAMUSCULAR | Status: AC
  Administered 2019-12-06: 5 mg via INTRAMUSCULAR
  Filled 2019-12-06: qty 1

## 2019-12-06 MED ORDER — INSULIN GLARGINE 100 UNIT/ML ~~LOC~~ SOLN
14.0000 [IU] | Freq: Every day | SUBCUTANEOUS | Status: DC
  Filled 2019-12-06: qty 0.14

## 2019-12-06 MED ORDER — SODIUM CHLORIDE 0.9% FLUSH
10.0000 mL | Freq: Two times a day (BID) | INTRAVENOUS | Status: DC
  Administered 2019-12-06 – 2019-12-10 (×4): 10 mL
  Administered 2019-12-10: 40 mL
  Administered 2019-12-11 – 2019-12-15 (×8): 10 mL
  Administered 2019-12-16: 20 mL
  Administered 2019-12-16 – 2019-12-17 (×2): 10 mL

## 2019-12-06 MED ORDER — IPRATROPIUM-ALBUTEROL 0.5-2.5 (3) MG/3ML IN SOLN: 3.0000 mL | RESPIRATORY_TRACT | Status: DC | PRN

## 2019-12-06 MED ORDER — APIXABAN 5 MG PO TABS
5.0000 mg | ORAL_TABLET | Freq: Two times a day (BID) | ORAL | Status: DC
  Administered 2019-12-06 – 2019-12-07 (×3): 5 mg via ORAL
  Filled 2019-12-06 (×3): qty 1

## 2019-12-06 MED ORDER — LISINOPRIL 20 MG PO TABS: 20.0000 mg | ORAL_TABLET | Freq: Every day | ORAL | Status: DC

## 2019-12-06 MED ORDER — LISINOPRIL 10 MG PO TABS
10.0000 mg | ORAL_TABLET | Freq: Every day | ORAL | Status: DC
  Administered 2019-12-07 – 2019-12-09 (×3): 10 mg via ORAL
  Filled 2019-12-06 (×3): qty 1

## 2019-12-06 MED ORDER — INSULIN GLARGINE 100 UNIT/ML ~~LOC~~ SOLN
10.0000 [IU] | Freq: Every day | SUBCUTANEOUS | Status: DC
  Administered 2019-12-07 – 2019-12-17 (×10): 10 [IU] via SUBCUTANEOUS
  Filled 2019-12-06 (×12): qty 0.1

## 2019-12-06 NOTE — Progress Notes (Signed)
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  Systolics were in the 99M at the facility.  She has a known chronic sacral wound for which she has been receiving Rocephin for 1 week.  Her urine was noted to be foul-smelling and she has a history of recurrent UTIs.  In the ED she was found to have a temperature of 100.2.  She was tachycardic and tachypneic up to 40 with systolics in the 42A.  UA was not consistent with infection.  She was noted to have a very large deep sacral wound with purulent drainage.  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  Significant Events: 4/22 admit via [place] from [facility] 4/23 I&D left buttock abscess in OR  Antimicrobials:  Cefepime 4/22 > Flagyl 4/22 > Vancomycin 4/22 >  Subjective: Vital signs appear to be stabilizing with heart rate now controlled.  The patient is much more alert today and able to interact with me to a much greater extent.  She denies chest pain or shortness of breath.  She states her buttock pain is reasonably controlled at present.  Assessment & Plan:  Severe sepsis POA - buttock abscess/osteomyelitis of the coccyx General surgery following -volume resuscitation completed - empiric antibiotics being dosed -now status post I&D -continue empiric broad antibiotic coverage -surgical cultures brewing - sespsis physiology essentially resolved at this time   Streptococcus viridans bacteremia Continue empiric antibiotic coverage for now while in wound  cultures are maturing -will need to consider endocarditis evaluation  Possible HCAP CT scan abdomen raised the question of a possible right middle and lower lobe infiltrate -patient is not hypoxic on her baseline oxygen support -broad antibiotic being used for abscess should provide adequate coverage -suspect this is likely simply atelectasis  Acute metabolic encephalopathy versus chronic cognitive deficit Baseline mental status not clear but reportedly the patient is confused at baseline -B12, ammonia, and folic acid are all normal  Paroxysmal atrial fibrillation With holding of beta-blocker patient experienced persistent episode of A. fib with RVR this morning -dosing of IV Lopressor helped control her heart rate -continue scheduled IV Lopressor until oral intake is more consistent - consider IV Cardizem drip if RVR recurs  Normocytic anemia Likely due to chronic disease/smoldering infection - monitor trend postop -hemoglobin stable for now  Bipolar disorder Prolonged QT noted on EKG therefore quetiapine on hold -continue Depakote  History of HTN Hypotensive at presentation -blood pressure now trending upward -slowly begin resuming usual outpatient medications  Morbid obesity - Body mass index is 39.14 kg/m.  HLD Continue Lipitor  DM 2 -uncontrolled with hyperglycemia A1c 7.8 reflecting poor control in outpatient setting -CBG much better controlled with adjustment in regimen made yesterday  QT prolongation Noted on admission EKG in ED -continue telemetry monitoring -discontinued quetiapine -follow-up EKG in a.m.  MRSA screen +  DVT prophylaxis: Resuming Eliquis today Code Status: FULL CODE Family Communication: No family present at time of exam Disposition Plan: Patient lives in SNF and will need to return to this environment when medically stable  Consultants:  none  Objective: Blood pressure (!) 111/56, pulse 72, temperature 98.9 F (37.2 C), temperature source Oral,  resp. rate 20, height 5\' 6"  (1.676 m), weight 110 kg, SpO2 100 %.  Intake/Output Summary (Last 24 hours) at 12/06/2019 0723 Last data filed at 12/05/2019 1634 Gross per 24 hour  Intake 500 ml  Output 300 ml  Net 200 ml   Filed Weights   12/03/19 1256  Weight: 110 kg    Examination: General: No acute respiratory distress -more alert today Lungs: Distant breath sounds - no wheezing or crackles Cardiovascular: Irregularly irregular with controlled heart rate without murmur Abdomen: Nondistended, soft, BS positive, no rebound Extremities: Trace edema B LE without change  CBC: Recent Labs  Lab 12/03/19 1332 12/03/19 1332 12/04/19 0326 12/04/19 0326 12/04/19 1129 12/05/19 0519 12/06/19 0344  WBC 12.6*   < > 12.9*  --   --  10.3 10.1  NEUTROABS 10.8*  --   --   --   --   --   --   HGB 10.4*   < > 9.5*   < > 8.8* 8.5* 8.7*  HCT 36.8   < > 34.7*   < > 26.0* 30.2* 30.6*  MCV 97.6   < > 99.4  --   --  97.1 97.5  PLT 323   < > 328  --   --  317 310   < > = values in this interval not displayed.   Basic Metabolic Panel: Recent Labs  Lab 12/03/19 1332 12/03/19 1332 12/04/19 0326 12/04/19 0326 12/04/19 1129 12/05/19 0519 12/06/19 0344  NA 139   < > 140   < > 141 143 142  K 4.5   < > 4.8   < > 3.8 4.3 4.0  CL 94*   < > 100  --   --  100 100  CO2 35*   < > 32  --   --  37* 35*  GLUCOSE 139*   < > 139*  --   --  181* 106*  BUN 13   < > 10  --   --  16 14  CREATININE 0.47   < > 0.41*  --   --  0.41* 0.35*  CALCIUM 8.6*   < > 7.9*  --   --  8.6* 8.4*  MG 1.7  --   --   --   --  2.0  --   PHOS  --   --   --   --   --  2.3*  --    < > = values in this interval not displayed.   GFR: Estimated Creatinine Clearance: 81.1 mL/min (A) (by C-G formula based on SCr of 0.35 mg/dL (L)).  Liver Function Tests: Recent Labs  Lab 12/03/19 1332 12/05/19 0519 12/06/19 0344  AST 45* 16 16  ALT 43 23 19  ALKPHOS 70 51 51  BILITOT 0.3 0.5 0.5  PROT 6.6 5.6* 5.4*  ALBUMIN 1.5* 1.5*  1.4*    Coagulation Profile: Recent Labs  Lab 12/03/19 1332  INR 1.7*    HbA1C: Hgb A1c MFr Bld  Date/Time Value Ref Range Status  12/03/2019 11:20 PM 7.8 (H) 4.8 - 5.6 % Final    Comment:    (NOTE) Pre diabetes:          5.7%-6.4% Diabetes:              >6.4% Glycemic control for   <7.0% adults with diabetes  09/07/2019 07:50 PM 9.5 (H) 4.8 - 5.6 % Final    Comment:    (NOTE) Pre diabetes:          5.7%-6.4% Diabetes:              >6.4% Glycemic control for   <7.0% adults with diabetes     CBG: Recent Labs  Lab 12/05/19 0929 12/05/19 1233 12/05/19 1701 12/05/19 2050 12/05/19 2310  GLUCAP 137* 176* 162* 84 97    Recent Results (from the past 240 hour(s))  Blood Culture (routine x 2)     Status: Abnormal (Preliminary result)   Collection Time: 12/03/19  1:15 PM   Specimen: BLOOD  Result Value Ref Range Status   Specimen Description BLOOD RIGHT ANTECUBITAL  Final   Special Requests   Final    BOTTLES DRAWN AEROBIC AND ANAEROBIC Blood Culture adequate volume   Culture  Setup Time   Final    GRAM POSITIVE COCCI IN CHAINS ANAEROBIC BOTTLE ONLY CRITICAL RESULT CALLED TO, READ BACK BY AND VERIFIED WITH: Gala Lewandowsky. Baumeister PharmD 10:15 12/04/19 ([email or web address])    Culture (A)  Final    VIRIDANS STREPTOCOCCUS THE SIGNIFICANCE OF ISOLATING THIS ORGANISM FROM A SINGLE SET OF BLOOD CULTURES WHEN MULTIPLE SETS ARE DRAWN IS UNCERTAIN. PLEASE NOTIFY THE MICROBIOLOGY DEPARTMENT WITHIN ONE WEEK IF SPECIATION AND SENSITIVITIES ARE REQUIRED. Performed at [facility], [address] [place], [place] [address]    Report Status PENDING  Incomplete  Blood Culture ID Panel (Reflexed)     Status: Abnormal   Collection Time: 12/03/19  1:15 PM  Result Value Ref Range Status   Enterococcus species NOT DETECTED NOT DETECTED Final   Listeria monocytogenes NOT DETECTED NOT DETECTED Final   Staphylococcus species NOT DETECTED NOT DETECTED Final   Staphylococcus aureus (BCID) NOT  DETECTED NOT DETECTED Final   Streptococcus species DETECTED (A) NOT DETECTED Final    Comment: Not Enterococcus species, Streptococcus agalactiae, Streptococcus pyogenes, or Streptococcus pneumoniae. CRITICAL RESULT CALLED TO, READ BACK BY AND VERIFIED WITH: Gala Lewandowsky. Baumeister PharmD 10:15 12/04/19 ([email or web address])    Streptococcus agalactiae NOT DETECTED NOT DETECTED Final   Streptococcus pneumoniae NOT DETECTED NOT DETECTED Final   Streptococcus pyogenes NOT DETECTED NOT DETECTED Final   Acinetobacter baumannii NOT DETECTED NOT DETECTED Final   Enterobacteriaceae species NOT DETECTED NOT DETECTED Final   Enterobacter cloacae complex NOT DETECTED NOT DETECTED Final   Escherichia coli NOT DETECTED NOT DETECTED Final   Klebsiella oxytoca NOT DETECTED NOT DETECTED Final   Klebsiella pneumoniae NOT DETECTED NOT DETECTED Final   Proteus species NOT DETECTED NOT DETECTED Final   Serratia marcescens NOT DETECTED NOT DETECTED Final   Haemophilus influenzae NOT DETECTED NOT DETECTED Final   Neisseria meningitidis NOT DETECTED NOT DETECTED Final   Pseudomonas aeruginosa NOT DETECTED NOT DETECTED Final   Candida albicans NOT DETECTED NOT DETECTED Final   Candida glabrata NOT DETECTED NOT DETECTED Final   Candida krusei NOT DETECTED NOT DETECTED Final   Candida parapsilosis NOT DETECTED NOT DETECTED Final   Candida tropicalis NOT DETECTED NOT DETECTED Final    Comment: Performed at [facility], [address] [place], [place] [address]  Blood Culture (routine x 2)     Status: None (Preliminary result)   Collection Time: 12/03/19  1:33 PM   Specimen: BLOOD LEFT WRIST  Result Value Ref Range Status   Specimen Description BLOOD LEFT WRIST  Final   Special Requests   Final    BOTTLES  DRAWN AEROBIC ONLY Blood Culture results may not be optimal due to an inadequate volume of blood received in culture bottles   Culture   Final    NO GROWTH 2 DAYS Performed at [facility], [address],  [place], [place] [address]    Report Status PENDING  Incomplete  Urine culture     Status: None   Collection Time: 12/03/19  5:20 PM   Specimen: In/Out Cath Urine  Result Value Ref Range Status   Specimen Description IN/OUT CATH URINE  Final   Special Requests NONE  Final   Culture   Final    NO GROWTH Performed at [facility], [address], [place], [place] [address]    Report Status 12/05/2019 FINAL  Final  Respiratory Panel by RT PCR (Flu A&B, Covid) - Nasopharyngeal Swab     Status: None   Collection Time: 12/03/19 11:17 PM   Specimen: Nasopharyngeal Swab  Result Value Ref Range Status   SARS Coronavirus 2 by RT PCR NEGATIVE NEGATIVE Final    Comment: (NOTE) SARS-CoV-2 target nucleic acids are NOT DETECTED. The SARS-CoV-2 RNA is generally detectable in upper respiratoy specimens during the acute phase of infection. The lowest concentration of SARS-CoV-2 viral copies this assay can detect is 131 copies/mL. A negative result does not preclude SARS-Cov-2 infection and should not be used as the sole basis for treatment or other patient management decisions. A negative result may occur with  improper specimen collection/handling, submission of specimen other than nasopharyngeal swab, presence of viral mutation(s) within the areas targeted by this assay, and inadequate number of viral copies (<131 copies/mL). A negative result must be combined with clinical observations, patient history, and epidemiological information. The expected result is Negative. Fact Sheet for Patients:  https://www.moore.com/ Fact Sheet for Healthcare Providers:  https://www.young.biz/ This test is not yet ap proved or cleared by the [place] FDA and  has been authorized for detection and/or diagnosis of SARS-CoV-2 by FDA under an Emergency Use Authorization (EUA). This EUA will remain  in effect (meaning this test can be used) for the duration of the  COVID-19 declaration under Section 564(b)(1) of the Act, 21 U.S.C. section 360bbb-3(b)(1), unless the authorization is terminated or revoked sooner.    Influenza A by PCR NEGATIVE NEGATIVE Final   Influenza B by PCR NEGATIVE NEGATIVE Final    Comment: (NOTE) The Xpert Xpress SARS-CoV-2/FLU/RSV assay is intended as an aid in  the diagnosis of influenza from Nasopharyngeal swab specimens and  should not be used as a sole basis for treatment. Nasal washings and  aspirates are unacceptable for Xpert Xpress SARS-CoV-2/FLU/RSV  testing. Fact Sheet for Patients: https://www.moore.com/ Fact Sheet for Healthcare Providers: https://www.young.biz/ This test is not yet approved or cleared by the [place] FDA and  has been authorized for detection and/or diagnosis of SARS-CoV-2 by  FDA under an Emergency Use Authorization (EUA). This EUA will remain  in effect (meaning this test can be used) for the duration of the  Covid-19 declaration under Section 564(b)(1) of the Act, 21  U.S.C. section 360bbb-3(b)(1), unless the authorization is  terminated or revoked. Performed at [facility], [address] [place], [place] [address]   MRSA PCR Screening     Status: Abnormal   Collection Time: 12/04/19  6:13 AM   Specimen: Nasal Mucosa; Nasopharyngeal  Result Value Ref Range Status   MRSA by PCR POSITIVE (A) NEGATIVE Final    Comment:  The GeneXpert MRSA Assay (FDA approved for NASAL specimens only), is one component of a comprehensive MRSA colonization surveillance program. It is not intended to diagnose MRSA infection nor to guide or monitor treatment for MRSA infections. RESULT CALLED TO, READ BACK BY AND VERIFIED WITH: Truman Hayward RN 8:40 12/04/19 ([email or web address]) Performed at [facility], [address], [place], [place] [address]   Aerobic/Anaerobic Culture (surgical/deep wound)     Status: None (Preliminary result)   Collection  Time: 12/04/19 10:21 AM   Specimen: PATH Other; Tissue  Result Value Ref Range Status   Specimen Description TISSUE  Final   Special Requests LEFT BUTTOCK ABSCESS SPEC A  Final   Gram Stain   Final    RARE WBC PRESENT,BOTH PMN AND MONONUCLEAR NO ORGANISMS SEEN    Culture   Final    NO GROWTH 1 DAY Performed at [facility], [address] [place], [place] [address]    Report Status PENDING  Incomplete     Scheduled Meds: . apixaban  5 mg Oral BID  . ascorbic acid  500 mg Oral BID  . atorvastatin  10 mg Oral Daily  . calcium carbonate  1 tablet Oral Daily  . cholecalciferol  1,000 Units Oral Daily  . divalproex  125 mg Oral QHS  . divalproex  500 mg Oral Daily  . insulin aspart  0-20 Units Subcutaneous TID WC  . insulin aspart  0-5 Units Subcutaneous QHS  . insulin glargine  18 Units Subcutaneous Daily  . ipratropium-albuterol  3 mL Inhalation BID  . metoprolol tartrate  5 mg Intravenous Q8H  . senna-docusate  1 tablet Oral Daily   Continuous Infusions: . ceFEPime (MAXIPIME) IV 2 g (12/06/19 0541)  . metronidazole 500 mg (12/05/19 2315)  . vancomycin Stopped (12/05/19 1901)     LOS: 3 days   Lonia Blood, MD [facility] Office  [phone] Pager - Text Page per Amion  If 7PM-7AM, please contact night-coverage per Amion 12/06/2019, 7:23 AM

## 2019-12-06 NOTE — Plan of Care (Signed)

## 2019-12-06 NOTE — Progress Notes (Signed)
Pharmacy Antibiotic Note  Becky Hendricks is a 74 y.o. female admitted on 12/03/2019 with sepsis.  Patient has purulent sacral wound and was taking CTX for 1 week prior. Has hx of recurrent UTI. Pharmacy has been consulted for Vancomycin and Cefepime dosing.  Pt is now on D4 of abx. He is s/p I&D of the sacral wound. His wbc has trended down to normal and he is afebrile. His renal function remains stable. We will use vanc trough for monitoring and it came back today at 15 (goal 15-20).   Plan: Continue vancomycin 1500mg  IV Q24 hrs Cefepime 2g Q 8 hrs Monitor renal function, cultures/sensitivities, clinical progression Check vancomycin levels as indicated  Height: 5\' 6"  (167.6 cm) Weight: 110 kg (242 lb 8.1 oz) IBW/kg (Calculated) : 59.3  Temp (24hrs), Avg:98.2 F (36.8 C), Min:97.8 F (36.6 C), Max:98.9 F (37.2 C)  Recent Labs  Lab 12/03/19 1332 12/03/19 1655 12/04/19 0326 12/05/19 0519 12/06/19 0344 12/06/19 0909  WBC 12.6*  --  12.9* 10.3 10.1  --   CREATININE 0.47  --  0.41* 0.41* 0.35*  --   LATICACIDVEN 1.5 1.5  --   --   --   --   VANCOTROUGH  --   --   --   --   --  15    Estimated Creatinine Clearance: 81.1 mL/min (A) (by C-G formula based on SCr of 0.35 mg/dL (L)).    Not on File  Antimicrobials this admission: Vancomycin 4/22 >>  Cefepime 4/22 >>  Flagyl 4/22 x1 resume 4/24>>  Dose adjustments this admission: N/A  Microbiology results: 4/22 BCx: 1/4 viridan strep 4/23 tissue>>ngtd 4/22 urine>>neg  5/23, PharmD, BCIDP, AAHIVP, CPP Infectious Disease Pharmacist 12/06/2019 10:04 AM

## 2019-12-06 NOTE — Progress Notes (Signed)
Ok to resume home apixaban for afib per Dr. Sharon Seller.  Apixaban 5mg  BID  , PharmD, [place], AAHIVP, CPP Infectious Disease Pharmacist 12/06/2019 7:32 AM

## 2019-12-06 NOTE — Consult Note (Signed)
WOC Nurse Consult Note: Reason for Consult: assistance with skin care Spoke with PA in the hallway today, needs assistance with generalized skin care; specifically mentioned knees and LEs Wound type: no open wounds Pressure Injury POA: NA  Wound [id number] like areas on the bilateral knees; healing  Drainage (amount, consistency, odor) none Periwound: intact; scarring Dressing procedure/placement/frequency: No topical care needed.  Discussed POC with patient and bedside nurse.  Re consult if needed, will not follow at this time. Thanks  Talana Slatten [employer], RN,CWOCN, CNS, CWON-AP [phone])

## 2019-12-07 LAB — CBC
HCT: 33.9 % — ABNORMAL LOW (ref 36.0–46.0)
Hemoglobin: 10.2 g/dL — ABNORMAL LOW (ref 12.0–15.0)
MCH: 27.3 pg (ref 26.0–34.0)
MCHC: 30.1 g/dL (ref 30.0–36.0)
MCV: 90.9 fL (ref 80.0–100.0)
Platelets: 350 10*3/uL (ref 150–400)
RBC: 3.73 MIL/uL — ABNORMAL LOW (ref 3.87–5.11)
RDW: 15.6 % — ABNORMAL HIGH (ref 11.5–15.5)
WBC: 13 10*3/uL — ABNORMAL HIGH (ref 4.0–10.5)
nRBC: 0.2 % (ref 0.0–0.2)

## 2019-12-07 LAB — GLUCOSE, CAPILLARY
Glucose-Capillary: 111 mg/dL — ABNORMAL HIGH (ref 70–99)
Glucose-Capillary: 171 mg/dL — ABNORMAL HIGH (ref 70–99)
Glucose-Capillary: 140 mg/dL — ABNORMAL HIGH (ref 70–99)
Glucose-Capillary: 168 mg/dL — ABNORMAL HIGH (ref 70–99)

## 2019-12-07 MED ORDER — MUPIROCIN 2 % EX OINT
1.0000 "application " | TOPICAL_OINTMENT | Freq: Two times a day (BID) | CUTANEOUS | Status: AC
  Administered 2019-12-07 – 2019-12-11 (×10): 1 via NASAL
  Filled 2019-12-07 (×2): qty 22

## 2019-12-07 MED ORDER — CHLORHEXIDINE GLUCONATE CLOTH 2 % EX PADS
6.0000 | MEDICATED_PAD | Freq: Every day | CUTANEOUS | Status: AC
  Administered 2019-12-07 – 2019-12-10 (×4): 6 via TOPICAL

## 2019-12-07 NOTE — Evaluation (Signed)
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 s/p I&D L buttocks abscess on 4/23.  Clinical Impression   Pt presents with body-wide weakness, extreme difficulty with bed mobility tasks, inability to transfer to EOB or OOB today, impaired skin integrity, impaired cognition, and decreased activity tolerance. Pt to benefit from acute PT to address deficits. Pt required total +2 for rolling bilaterally, pt appearing fearful of mobility and with little involvement. PT educated pt on the importance of rolling for pressure relief, PT placed pillows along L sacrum and trunk/shoulder girdle to offweight wound area. PT recommending SNF post-acutely to address deficits. PT to progress mobility as tolerated, and will continue to follow acutely.      Follow Up Recommendations SNF;Supervision/Assistance - 24 hour    Equipment Recommendations  None recommended by PT    Recommendations for Other Services       Precautions / Restrictions Precautions Precautions: Fall Precaution Comments: sacral wound - pressure relief Restrictions Weight Bearing Restrictions: No      Mobility  Bed Mobility Overal bed mobility: Needs Assistance Bed Mobility: Rolling Rolling: Total assist;+2 for physical assistance;+2 for safety/equipment         General bed mobility comments: Total assist +2 for rolling bilaterally for changing bed pads, verbal cuing for reaching with CL UE to opposite bedrail to assist in rolling. Total assist for scooting pt up in bed with use of bed  pad.  Transfers                 General transfer comment: unable this day  Ambulation/Gait             General Gait Details: unable this day  Stairs            Wheelchair Mobility    Modified Rankin (Stroke Patients Only)       Balance Overall balance assessment: Needs assistance     Sitting balance - Comments: unable to assess sitting balance, pt unable to come to sitting                                     Pertinent Vitals/Pain Pain Assessment: Faces Faces Pain Scale: Hurts little more Pain Location: sacrum Pain Descriptors / Indicators: Sore;Discomfort;Grimacing;Guarding Pain Intervention(s): Limited activity within patient's tolerance;Monitored during session;Repositioned    Home Living Family/patient expects to be discharged to:: Skilled nursing facility                      Prior Function Level of Independence: Needs assistance   Gait / Transfers Assistance Needed: pt initially reports walking PTA, but with further questions pt states "I haven't walked in a long time". Per chart review, pt independent with mobility prior to admission 07/2019.  ADL's / Homemaking Assistance Needed: Pt states she is independent with ADLs PTA, but PT assuming pt is total care for ADLs/iADLs at SNF, given phyiscal state.  Comments: Prior to December 2020 admission, pt was living alone in a condo, independent with all ADLs, IADLs, and  mobility. Pt did not ambulate with an assistive device.      Hand Dominance   Dominant Hand: Right    Extremity/Trunk Assessment   Upper Extremity Assessment Upper Extremity Assessment: Generalized weakness    Lower Extremity Assessment Lower Extremity Assessment: Generalized weakness;RLE deficits/detail;LLE deficits/detail RLE Deficits / Details: Able to initiate knee flexion, DF/PF but very limited AROM. full AROM contraction knee extension in supine. PROM assessment limited due to pt guarding. LLE  Deficits / Details: Able to initiate knee flexion, DF/PF but very limited AROM. full AROM contraction knee extension in supine. PROM assessment limited due to pt guarding    Cervical / Trunk Assessment Cervical / Trunk Assessment: Normal  Communication   Communication: No difficulties  Cognition Arousal/Alertness: Awake/alert Behavior During Therapy: Anxious;Restless Overall Cognitive Status: No family/caregiver present to determine baseline cognitive functioning Area of Impairment: Orientation;Following commands;Safety/judgement;Problem solving;Awareness                 Orientation Level: Disoriented to;Place;Time;Situation     Following Commands: Follows one step commands inconsistently;Follows one step commands with increased time Safety/Judgement: Decreased awareness of deficits;Decreased awareness of safety Awareness: Intellectual Problem Solving: Slow processing;Decreased initiation;Difficulty sequencing;Requires tactile cues;Requires verbal cues General Comments: Pt did not know where she was or why she was here, oriented to [facility]. Pt very anxious and trembling during all mobility efforts.      General Comments General comments (skin integrity, edema, etc.): Pt diaphoretic and pads wet, required total care to change.    Exercises     Assessment/Plan    PT Assessment Patient needs continued PT services  PT Problem List Decreased strength;Decreased mobility;Decreased range of motion;Decreased activity tolerance;Decreased balance;Decreased knowledge of use of DME;Pain;Decreased cognition       PT Treatment Interventions DME instruction;Therapeutic activities;Therapeutic exercise;Patient/family education;Balance training;Gait training;Functional mobility training;Neuromuscular re-education    PT Goals (Current goals can be found in the Care Plan section)  Acute Rehab PT Goals PT Goal Formulation: With patient Time For Goal Achievement: 12/21/19 Potential to Achieve  Goals: Fair    Frequency Min 2X/week   Barriers to discharge        Co-evaluation               AM-PAC PT "6 Clicks" Mobility  Outcome Measure Help needed turning from your back to your side while in a flat bed without using bedrails?: Total Help needed moving from lying on your back to sitting on the side of a flat bed without using bedrails?: Total Help needed moving to and from a bed to a chair (including a wheelchair)?: Total Help needed standing up from a chair using your arms (e.g., wheelchair or bedside chair)?: Total Help needed to walk in hospital room?: Total Help needed climbing 3-5 steps with a railing? : Total 6 Click Score: 6    End of Session   Activity Tolerance: Patient limited by fatigue;Patient limited by pain Patient left: in bed;with call bell/phone within reach;with bed alarm set Nurse Communication: Mobility status PT Visit Diagnosis: Other abnormalities of gait and mobility (R26.89);Muscle weakness (generalized) (M62.81)    Time: 1779-3903 PT Time Calculation (min) (ACUTE ONLY): 16 min   Charges:   PT Evaluation $PT Eval Low Complexity: 1 Low        Mina Carlisi E, PT Acute Rehabilitation Services Pager [phone]  Office [phone]   Makinsley Schiavi D Despina Hidden 12/07/2019, 4:36 PM

## 2019-12-07 NOTE — Progress Notes (Signed)
Fed patient 75% of dinner tray and strawberry ensure

## 2019-12-07 NOTE — Progress Notes (Signed)
Patient being turned side to side and repositioned in bed

## 2019-12-07 NOTE — Progress Notes (Addendum)
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  Systolics were in the 70s at the facility.  She has a known chronic sacral wound for which she has been receiving Rocephin for 1 week.  Her urine was noted to be foul-smelling and she has a history of recurrent UTIs.  In the ED she was found to have a temperature of 100.2.  She was tachycardic and tachypneic up to 40 with systolics in the 90s.  UA was not consistent with infection.  She was noted to have a very large deep sacral wound with purulent drainage.  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  Significant Events: 4/22 admit via [place] from [facility] 4/23 I&D left buttock abscess in OR  Antimicrobials:  Cefepime 4/22 > Flagyl 4/22 > Vancomycin 4/22 >  Subjective: Sedated at the time of my visit.  No respiratory distress.  No evidence of discomfort.  Vital signs are stable and oxygen saturation is stable on 2 L nasal cannula.  Assessment & Plan:  Severe sepsis POA - buttock abscess/osteomyelitis of the coccyx General surgery following -volume resuscitation completed - empiric antibiotics being dosed -now status post I&D -continue empiric broad antibiotic coverage -surgical cultures brewing - sespsis physiology essentially resolved at this time -significant purulent discharge was able to be expressed from the wound today therefore return to the OR is being considered  Streptococcus viridans bacteremia Continue empiric antibiotic coverage for now  while surgical wound cultures are maturing -obtain TTE and based upon the results we will determine if further investigation for possible concomitant endocarditis has to be pursued -suspect her wound is clearly the source however will continue broad coverage as there are likely multiple pathogens at play even though only one has been identified in blood cultures  Possible HCAP CT scan abdomen raised the question of a possible right middle and lower lobe infiltrate -patient is not hypoxic on her baseline oxygen support -broad antibiotic being used for abscess should provide adequate coverage -suspect this is likely simply atelectasis -no clinical evidence of a true pneumonia at this time  Acute metabolic encephalopathy versus chronic cognitive deficit Baseline mental status not clear but reportedly the patient is confused at baseline - B12, ammonia, and folic acid are all normal -mental status continues to wax and wane with the patient being much more alert and oriented yesterday  Paroxysmal atrial fibrillation continue scheduled IV Lopressor until oral intake is more consistent - consider IV Cardizem drip if RVR recurs -discontinue Eliquis again today as it appears patient may require return to the OR  Normocytic anemia Likely due to chronic disease/smoldering infection - monitor trend postop -hemoglobin stable for now  Bipolar disorder Prolonged QT noted on EKG therefore quetiapine on hold -continue Depakote  History of HTN Hypotensive at presentation -blood pressure now trending upward -slowly begin resuming usual outpatient medications  Morbid obesity - Body mass index is 39.14 kg/m.  HLD Continue Lipitor  DM 2 -uncontrolled with hyperglycemia A1c 7.8 reflecting poor control in outpatient setting -CBG reasonably controlled at this  time  QT prolongation Noted on admission EKG in ED -continue telemetry monitoring -discontinued quetiapine -follow-up EKG notes QT remains slightly  prolonged  MRSA screen + Continue empiric vancomycin given extensive sacral wound  DVT prophylaxis: Eliquis being discontinued again today -apply SCDs Code Status: FULL CODE Family Communication: No family present at time of exam Disposition Plan: Patient lives in SNF and will need to return to this environment when medically stable -hospitalization will likely be prolonged with the need for recurrent debridements and antibiotic management  Consultants:  General surgery  Objective: Blood pressure (!) 153/87, pulse 94, temperature 98.4 F (36.9 C), temperature source Oral, resp. rate (!) 32, height 5\' 6"  (1.676 m), weight 110 kg, SpO2 97 %.  Intake/Output Summary (Last 24 hours) at 12/07/2019 0914 Last data filed at 12/07/2019 0526 Gross per 24 hour  Intake --  Output 2650 ml  Net -2650 ml   Filed Weights   12/03/19 1256  Weight: 110 kg    Examination: General: No acute respiratory distress -sedate but comfortable Lungs: Distant breath sounds throughout all fields with no wheezing Cardiovascular: Irregularly irregular with controlled heart rate -no appreciable murmur or rub Abdomen: Nondistended, soft, BS positive, no rebound Extremities: Trace edema B LE which is stable  CBC: Recent Labs  Lab 12/03/19 1332 12/04/19 0326 12/05/19 0519 12/06/19 0344 12/07/19 0510  WBC 12.6*   < > 10.3 10.1 13.0*  NEUTROABS 10.8*  --   --   --   --   HGB 10.4*   < > 8.5* 8.7* 10.2*  HCT 36.8   < > 30.2* 30.6* 33.9*  MCV 97.6   < > 97.1 97.5 90.9  PLT 323   < > 317 310 350   < > = values in this interval not displayed.   Basic Metabolic Panel: Recent Labs  Lab 12/03/19 1332 12/03/19 1332 12/04/19 0326 12/04/19 0326 12/04/19 1129 12/05/19 0519 12/06/19 0344  NA 139   < > 140   < > 141 143 142  K 4.5   < > 4.8   < > 3.8 4.3 4.0  CL 94*   < > 100  --   --  100 100  CO2 35*   < > 32  --   --  37* 35*  GLUCOSE 139*   < > 139*  --   --  181* 106*  BUN 13   < > 10  --   --  16 14   CREATININE 0.47   < > 0.41*  --   --  0.41* 0.35*  CALCIUM 8.6*   < > 7.9*  --   --  8.6* 8.4*  MG 1.7  --   --   --   --  2.0  --   PHOS  --   --   --   --   --  2.3*  --    < > = values in this interval not displayed.   GFR: Estimated Creatinine Clearance: 81.1 mL/min (A) (by C-G formula based on SCr of 0.35 mg/dL (L)).  Liver Function Tests: Recent Labs  Lab 12/03/19 1332 12/05/19 0519 12/06/19 0344  AST 45* 16 16  ALT 43 23 19  ALKPHOS 70 51 51  BILITOT 0.3 0.5 0.5  PROT 6.6 5.6* 5.4*  ALBUMIN 1.5* 1.5* 1.4*    Coagulation Profile: Recent Labs  Lab 12/03/19 1332  INR 1.7*    HbA1C: Hgb A1c MFr Bld  Date/Time Value Ref Range Status  12/03/2019 11:20 PM 7.8 (H) 4.8 - 5.6 % Final    Comment:    (NOTE) Pre diabetes:          5.7%-6.4% Diabetes:              >6.4% Glycemic control for   <7.0% adults with diabetes   09/07/2019 07:50 PM 9.5 (H) 4.8 - 5.6 % Final    Comment:    (NOTE) Pre diabetes:          5.7%-6.4% Diabetes:              >6.4% Glycemic control for   <7.0% adults with diabetes     CBG: Recent Labs  Lab 12/06/19 0756 12/06/19 1242 12/06/19 1820 12/06/19 2022 12/07/19 0739  GLUCAP 92 98 94 121* 111*    Recent Results (from the past 240 hour(s))  Blood Culture (routine x 2)     Status: Abnormal   Collection Time: 12/03/19  1:15 PM   Specimen: BLOOD  Result Value Ref Range Status   Specimen Description BLOOD RIGHT ANTECUBITAL  Final   Special Requests   Final    BOTTLES DRAWN AEROBIC AND ANAEROBIC Blood Culture adequate volume   Culture  Setup Time   Final    GRAM POSITIVE COCCI IN CHAINS ANAEROBIC BOTTLE ONLY CRITICAL RESULT CALLED TO, READ BACK BY AND VERIFIED WITH: Sharen Heck PharmD 10:15 12/04/19 ([email or web address])    Culture (A)  Final    VIRIDANS STREPTOCOCCUS THE SIGNIFICANCE OF ISOLATING THIS ORGANISM FROM A SINGLE SET OF BLOOD CULTURES WHEN MULTIPLE SETS ARE DRAWN IS UNCERTAIN. PLEASE NOTIFY THE MICROBIOLOGY DEPARTMENT WITHIN  ONE WEEK IF SPECIATION AND SENSITIVITIES ARE REQUIRED. Performed at [facility], [place] [address] [place], [place] [address]    Report Status 12/06/2019 FINAL  Final  Blood Culture ID Panel (Reflexed)     Status: Abnormal   Collection Time: 12/03/19  1:15 PM  Result Value Ref Range Status   Enterococcus species NOT DETECTED NOT DETECTED Final   Listeria monocytogenes NOT DETECTED NOT DETECTED Final   Staphylococcus species NOT DETECTED NOT DETECTED Final   Staphylococcus aureus (BCID) NOT DETECTED NOT DETECTED Final   Streptococcus species DETECTED (A) NOT DETECTED Final    Comment: Not Enterococcus species, Streptococcus agalactiae, Streptococcus pyogenes, or Streptococcus pneumoniae. CRITICAL RESULT CALLED TO, READ BACK BY AND VERIFIED WITH: Sharen Heck PharmD 10:15 12/04/19 ([email or web address])    Streptococcus agalactiae NOT DETECTED NOT DETECTED Final   Streptococcus pneumoniae NOT DETECTED NOT DETECTED Final   Streptococcus pyogenes NOT DETECTED NOT DETECTED Final   Acinetobacter baumannii NOT DETECTED NOT DETECTED Final   Enterobacteriaceae species NOT DETECTED NOT DETECTED Final   Enterobacter cloacae complex NOT DETECTED NOT DETECTED Final   Escherichia coli NOT DETECTED NOT DETECTED Final   Klebsiella oxytoca NOT DETECTED NOT DETECTED Final   Klebsiella pneumoniae NOT DETECTED NOT DETECTED Final   Proteus species NOT DETECTED NOT DETECTED Final   Serratia marcescens NOT DETECTED NOT DETECTED Final   Haemophilus influenzae NOT DETECTED NOT DETECTED Final   Neisseria meningitidis NOT DETECTED NOT DETECTED Final   Pseudomonas aeruginosa NOT DETECTED NOT DETECTED Final   Candida albicans NOT DETECTED NOT DETECTED Final   Candida glabrata NOT DETECTED NOT DETECTED Final   Candida krusei NOT DETECTED NOT DETECTED Final   Candida parapsilosis NOT DETECTED NOT DETECTED Final   Candida tropicalis NOT DETECTED NOT DETECTED Final    Comment: Performed at [facility], [place].  [place],  [place] [address]  Blood Culture (routine x 2)     Status: None (Preliminary result)   Collection Time: 12/03/19  1:33 PM   Specimen: BLOOD LEFT WRIST  Result Value Ref Range Status   Specimen Description BLOOD LEFT WRIST  Final   Special Requests   Final    BOTTLES DRAWN AEROBIC ONLY Blood Culture results may not be optimal due to an inadequate volume of blood received in culture bottles   Culture   Final    NO GROWTH 3 DAYS Performed at [facility], [address] [place], [place] [address]    Report Status PENDING  Incomplete  Urine culture     Status: None   Collection Time: 12/03/19  5:20 PM   Specimen: In/Out Cath Urine  Result Value Ref Range Status   Specimen Description IN/OUT CATH URINE  Final   Special Requests NONE  Final   Culture   Final    NO GROWTH Performed at [facility], [address] [place], [place] [address]    Report Status 12/05/2019 FINAL  Final  Respiratory Panel by RT PCR (Flu A&B, Covid) - Nasopharyngeal Swab     Status: None   Collection Time: 12/03/19 11:17 PM   Specimen: Nasopharyngeal Swab  Result Value Ref Range Status   SARS Coronavirus 2 by RT PCR NEGATIVE NEGATIVE Final    Comment: (NOTE) SARS-CoV-2 target nucleic acids are NOT DETECTED. The SARS-CoV-2 RNA is generally detectable in upper respiratoy specimens during the acute phase of infection. The lowest concentration of SARS-CoV-2 viral copies this assay can detect is 131 copies/mL. A negative result does not preclude SARS-Cov-2 infection and should not be used as the sole basis for treatment or other patient management decisions. A negative result may occur with  improper specimen collection/handling, submission of specimen other than nasopharyngeal swab, presence of viral mutation(s) within the areas targeted by this assay, and inadequate number of viral copies (<131 copies/mL). A negative result must be combined with clinical observations, patient history,  and epidemiological information. The expected result is Negative. Fact Sheet for Patients:  https://www.moore.com/ Fact Sheet for Healthcare Providers:  https://www.young.biz/ This test is not yet ap proved or cleared by the [place] FDA and  has been authorized for detection and/or diagnosis of SARS-CoV-2 by FDA under an Emergency Use Authorization (EUA). This EUA will remain  in effect (meaning this test can be used) for the duration of the COVID-19 declaration under Section 564(b)(1) of the Act, 21 U.S.C. section 360bbb-3(b)(1), unless the authorization is terminated or revoked sooner.    Influenza A by PCR NEGATIVE NEGATIVE Final   Influenza B by PCR NEGATIVE NEGATIVE Final    Comment: (NOTE) The Xpert Xpress SARS-CoV-2/FLU/RSV assay is intended as an aid in  the diagnosis of influenza from Nasopharyngeal swab specimens and  should not be used as a sole basis for treatment. Nasal washings and  aspirates are unacceptable for Xpert Xpress SARS-CoV-2/FLU/RSV  testing. Fact Sheet for Patients: https://www.moore.com/ Fact Sheet for Healthcare Providers: https://www.young.biz/ This test is not yet approved or cleared by the [place] FDA and  has been authorized for detection and/or diagnosis of SARS-CoV-2 by  FDA under an Emergency Use Authorization (EUA). This EUA will remain  in effect (meaning this test can be used) for the duration of the  Covid-19 declaration under Section 564(b)(1) of the Act, 21  U.S.C. section 360bbb-3(b)(1), unless the authorization is  terminated or revoked. Performed at [facility]  Lab, [address] [place], [place] [address]   MRSA PCR Screening     Status: Abnormal   Collection Time: 12/04/19  6:13 AM   Specimen: Nasal Mucosa; Nasopharyngeal  Result Value Ref Range Status   MRSA by PCR POSITIVE (A) NEGATIVE Final    Comment:        The GeneXpert MRSA Assay  (FDA approved for NASAL specimens only), is one component of a comprehensive MRSA colonization surveillance program. It is not intended to diagnose MRSA infection nor to guide or monitor treatment for MRSA infections. RESULT CALLED TO, READ BACK BY AND VERIFIED WITH: Truman HaywardM. Brooks RN 8:40 12/04/19 ([email or web address]) Performed at [facility], [address] [place], [place] [address]   Aerobic/Anaerobic Culture (surgical/deep wound)     Status: None (Preliminary result)   Collection Time: 12/04/19 10:21 AM   Specimen: PATH Other; Tissue  Result Value Ref Range Status   Specimen Description TISSUE  Final   Special Requests LEFT BUTTOCK ABSCESS SPEC A  Final   Gram Stain   Final    RARE WBC PRESENT,BOTH PMN AND MONONUCLEAR NO ORGANISMS SEEN    Culture   Final    CULTURE REINCUBATED FOR BETTER GROWTH Performed at [facility], [address] [place], [place] [address]    Report Status PENDING  Incomplete     Scheduled Meds: . apixaban  5 mg Oral BID  . ascorbic acid  500 mg Oral BID  . atorvastatin  10 mg Oral Daily  . calcium carbonate  1 tablet Oral Daily  . cholecalciferol  1,000 Units Oral Daily  . divalproex  125 mg Oral QHS  . divalproex  500 mg Oral Daily  . insulin aspart  0-20 Units Subcutaneous TID WC  . insulin aspart  0-5 Units Subcutaneous QHS  . insulin glargine  10 Units Subcutaneous Daily  . lisinopril  10 mg Oral Daily  . metoprolol tartrate  5 mg Intravenous Q8H  . senna-docusate  1 tablet Oral Daily  . sodium chloride flush  10-40 mL Intracatheter Q12H   Continuous Infusions: . ceFEPime (MAXIPIME) IV 2 g (12/07/19 0524)  . metronidazole 500 mg (12/07/19 0828)  . vancomycin 1,500 mg (12/06/19 1218)     LOS: 4 days   Lonia BloodJeffrey T. Ghada Abbett, MD [facility] Office  [phone] Pager - Text Page per Amion  If 7PM-7AM, please contact night-coverage per Amion 12/07/2019, 9:14 AM

## 2019-12-07 NOTE — Progress Notes (Addendum)
3 Days Post-Op  Subjective: CC: Orientated to self. No complaints. Last dressing change last night around 8pm  Objective: Vital signs in last 24 hours: Temp:  [98.2 F (36.8 C)-98.8 F (37.1 C)] 98.4 F (36.9 C) (04/26 0738) Pulse Rate:  [94] 94 (04/26 0426) Resp:  [30-32] 32 (04/26 0740) BP: (146-153)/(77-103) 153/87 (04/26 0738) SpO2:  [97 %] 97 % (04/26 0740)    Intake/Output from previous day: 04/25 0701 - 04/26 0700 In: -  Out: 2650 [Urine:2650] Intake/Output this shift: No intake/output data recorded.  PE: Gen:  Awake, NAD Pulm: On o2, normal rate and effort  Abd: Soft, NT/ND, +BS, no HSM, no hernia, incisions C/D/I, drain with minimal sanguinous drainage Ext:  LE WWP. DP 2+ Psych: A&Ox1 Skin: no rashes noted, warm and dry Wound: See below. Chaperone present, RN x 2. Packing removed from two left buttock incisions measuring 5cm in length. Dressing clean on superior incision. Superior surgical wound with healthy appearing tissue at the base. Inferior of two surgical incisions with scant purulence on dressing. No tracking of the wound. No loculations. No surrounding erythema, heat, induration or fluctuance. No active drainage after dressing was removed. Sacral wound measuring 6cm x 4cm. Dressing saturated with purulent material. Base of wound with healthy appearing granulation tissue. Sacrum is palpable. There is active purulent dressing from the wound that is expressed from the wound when pressure is applied above the wound at the 9 o'clock position (towards left buttock, see pictures below). There is no surrounding skin changes, induration, obvious fluctuance. No wound tracking with probing of a sterile cotton swab.            Lab Results:  Recent Labs    12/06/19 0344 12/07/19 0510  WBC 10.1 13.0*  HGB 8.7* 10.2*  HCT 30.6* 33.9*  PLT 310 350   BMET Recent Labs    12/05/19 0519 12/06/19 0344  NA 143 142  K 4.3 4.0  CL 100 100  CO2 37* 35*    GLUCOSE 181* 106*  BUN 16 14  CREATININE 0.41* 0.35*  CALCIUM 8.6* 8.4*   PT/INR No results for input(s): LABPROT, INR in the last 72 hours. CMP     Component Value Date/Time   NA 142 12/06/2019 0344   K 4.0 12/06/2019 0344   CL 100 12/06/2019 0344   CO2 35 (H) 12/06/2019 0344   GLUCOSE 106 (H) 12/06/2019 0344   BUN 14 12/06/2019 0344   CREATININE 0.35 (L) 12/06/2019 0344   CALCIUM 8.4 (L) 12/06/2019 0344   PROT 5.4 (L) 12/06/2019 0344   ALBUMIN 1.4 (L) 12/06/2019 0344   AST 16 12/06/2019 0344   ALT 19 12/06/2019 0344   ALKPHOS 51 12/06/2019 0344   BILITOT 0.5 12/06/2019 0344   GFRNONAA >60 12/06/2019 0344   GFRAA >60 12/06/2019 0344   Lipase  No results found for: LIPASE     Studies/Results: No results found.  Anti-infectives: Anti-infectives (From admission, onward)   Start     Dose/Rate Route Frequency Ordered Stop   12/05/19 1500  metroNIDAZOLE (FLAGYL) IVPB 500 mg     500 mg 100 mL/hr over 60 Minutes Intravenous Every 8 hours 12/05/19 1342     12/04/19 1430  vancomycin (VANCOREADY) IVPB 1500 mg/300 mL     1,500 mg 150 mL/hr over 120 Minutes Intravenous Every 24 hours 12/03/19 1435     12/03/19 2130  ceFEPIme (MAXIPIME) 2 g in sodium chloride 0.9 % 100 mL IVPB  2 g 200 mL/hr over 30 Minutes Intravenous Every 8 hours 12/03/19 1435     12/03/19 1330  ceFEPIme (MAXIPIME) 2 g in sodium chloride 0.9 % 100 mL IVPB     2 g 200 mL/hr over 30 Minutes Intravenous  Once 12/03/19 1307 12/03/19 1559   12/03/19 1315  metroNIDAZOLE (FLAGYL) IVPB 500 mg     500 mg 100 mL/hr over 60 Minutes Intravenous  Once 12/03/19 1307 12/03/19 1559   12/03/19 1315  vancomycin (VANCOCIN) IVPB 1000 mg/200 mL premix  Status:  Discontinued     1,000 mg 200 mL/hr over 60 Minutes Intravenous  Once 12/03/19 1307 12/03/19 1311   12/03/19 1315  vancomycin (VANCOCIN) 2,500 mg in sodium chloride 0.9 % 500 mL IVPB     2,500 mg 250 mL/hr over 120 Minutes Intravenous  Once 12/03/19 1311  12/03/19 1658       Assessment/Plan Atrial fibrillation on Eliquis Baseline confusion/bipolar disorder Type 2 diabetes Chronic respiratory failure on home O2 Covid infection January 2021 Possible HCAP  Sepsis Bacteremia - strep viridans  Left buttocks abscess Stage IV pressure ulcers buttocks S/p I&D of left buttocks abscess 12/04/2019 Dr. Violeta Gelinas - POD #3 - Wound is actively draining purulent material out of sacral wound. Cont BID dressing changes. Will discuss with MD if any plans for re-exploration. Patient is already back on Eliquis.  - Air mattress, frequent turns - PT  - Cont abx. Await cx's  FEN: Carb modified diet ID: Flagyl x1 dose 4/22; cefepime/vancomycin 4/22>> Flagyl 4/24 >> WBC up at 13 DVT: SCDs; Eliquis  Follow-up: Wound care    LOS: 4 days    Jacinto Halim , [facility] 12/07/2019, 8:10 AM Please see Amion for pager number during day hours 7:00am-4:30pm

## 2019-12-07 NOTE — Progress Notes (Signed)
MEWS Documentation, patient has continued to have heart rates in the 70-90s with 2 short  runs of tachycardia, both times during patient care with turning and repositioning. BP has been trending up.  Dr. Sharon Seller paged with [facility] regarding vital signs.

## 2019-12-08 ENCOUNTER — Encounter (HOSPITAL_BASED_OUTPATIENT_CLINIC_OR_DEPARTMENT_OTHER): Payer: Medicare Other | Admitting: Internal Medicine

## 2019-12-08 ENCOUNTER — Inpatient Hospital Stay (HOSPITAL_COMMUNITY): Payer: Medicare Other

## 2019-12-08 DIAGNOSIS — I361 Nonrheumatic tricuspid (valve) insufficiency: Secondary | ICD-10-CM

## 2019-12-08 DIAGNOSIS — E876 Hypokalemia: Secondary | ICD-10-CM

## 2019-12-08 LAB — COMPREHENSIVE METABOLIC PANEL
ALT: 18 U/L (ref 0–44)
AST: 16 U/L (ref 15–41)
Albumin: 1.5 g/dL — ABNORMAL LOW (ref 3.5–5.0)
Alkaline Phosphatase: 85 U/L (ref 38–126)
Anion gap: 9 (ref 5–15)
BUN: 11 mg/dL (ref 8–23)
CO2: 38 mmol/L — ABNORMAL HIGH (ref 22–32)
Calcium: 8.2 mg/dL — ABNORMAL LOW (ref 8.9–10.3)
Chloride: 91 mmol/L — ABNORMAL LOW (ref 98–111)
Creatinine, Ser: 0.54 mg/dL (ref 0.44–1.00)
GFR calc Af Amer: >60 mL/min (ref >60)
GFR calc non Af Amer: >60 mL/min (ref >60)
Glucose, Bld: 199 mg/dL — ABNORMAL HIGH (ref 70–99)
Potassium: 2.9 mmol/L — ABNORMAL LOW (ref 3.5–5.1)
Sodium: 138 mmol/L (ref 135–145)
Total Bilirubin: 0.3 mg/dL (ref 0.3–1.2)
Total Protein: 5.6 g/dL — ABNORMAL LOW (ref 6.5–8.1)

## 2019-12-08 LAB — GLUCOSE, CAPILLARY
Glucose-Capillary: 140 mg/dL — ABNORMAL HIGH (ref 70–99)
Glucose-Capillary: 261 mg/dL — ABNORMAL HIGH (ref 70–99)
Glucose-Capillary: 247 mg/dL — ABNORMAL HIGH (ref 70–99)
Glucose-Capillary: 225 mg/dL — ABNORMAL HIGH (ref 70–99)

## 2019-12-08 LAB — ECHOCARDIOGRAM COMPLETE
Height: 66 in
Weight: 3880.1 oz

## 2019-12-08 LAB — CBC
HCT: 33.4 % — ABNORMAL LOW (ref 36.0–46.0)
Hemoglobin: 10 g/dL — ABNORMAL LOW (ref 12.0–15.0)
MCH: 27 pg (ref 26.0–34.0)
MCHC: 29.9 g/dL — ABNORMAL LOW (ref 30.0–36.0)
MCV: 90.3 fL (ref 80.0–100.0)
Platelets: 360 10*3/uL (ref 150–400)
RBC: 3.7 MIL/uL — ABNORMAL LOW (ref 3.87–5.11)
RDW: 15.6 % — ABNORMAL HIGH (ref 11.5–15.5)
WBC: 10.7 10*3/uL — ABNORMAL HIGH (ref 4.0–10.5)
nRBC: 0.3 % — ABNORMAL HIGH (ref 0.0–0.2)

## 2019-12-08 LAB — MAGNESIUM: Magnesium: 1.5 mg/dL — ABNORMAL LOW (ref 1.7–2.4)

## 2019-12-08 LAB — CULTURE, BLOOD (ROUTINE X 2): Culture: NO GROWTH

## 2019-12-08 MED ORDER — POTASSIUM CHLORIDE CRYS ER 20 MEQ PO TBCR
40.0000 meq | EXTENDED_RELEASE_TABLET | ORAL | Status: AC
  Administered 2019-12-08 (×3): 40 meq via ORAL
  Filled 2019-12-08 (×3): qty 2

## 2019-12-08 MED ORDER — MAGNESIUM SULFATE 2 GM/50ML IV SOLN
2.0000 g | Freq: Once | INTRAVENOUS | Status: AC
  Administered 2019-12-08: 2 g via INTRAVENOUS
  Filled 2019-12-08: qty 50

## 2019-12-08 NOTE — Progress Notes (Addendum)
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  Systolics were in the 50K at the facility.  She has a known chronic sacral wound for which she has been receiving Rocephin for 1 week.  Her urine was noted to be foul-smelling and she has a history of recurrent UTIs.  In the ED she was found to have a temperature of 100.2.  She was tachycardic and tachypneic up to 40 with systolics in the 93G.  UA was not consistent with infection.  She was noted to have a very large deep sacral wound with purulent drainage.  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   Significant Events: 4/22 admit via [place] from [facility] 4/23 I&D left buttock abscess in OR  Antimicrobials:  Cefepime 4/22 > Flagyl 4/22 > Vancomycin 4/22 >    HPI/Recap of past 24 hours:  She is confused, not a reliable historian, she does follow commands She denies pain She is on 2 L of oxygen, no hypoxia documented at rest, breathing is not labored Telemetry last 24 hours with intermittent A. fib RVR in between sinus rhythm  Per RN Puriwick is not able to secure in place due to body habitus, stool is not loose enough to warrant rectal tube   Assessment/Plan: Principal Problem:   Osteomyelitis ([place]) Active Problems:   Acute metabolic encephalopathy   Severe sepsis (HCC)   Abscess of multiple sites of buttock   AF (paroxysmal atrial fibrillation) ([place])   Sepsis presented on admission with Streptococcus viridans bacteremia,  left buttock abscess, stage IV pressure ulcer present on admission Status post I&D left buttock abscess on April 23, wound is actively draining purulent material, general surgery plan to further evaluation the OR once adequate is aware of, last dose of Eliquis was given on April 20 6 in the morning around 10:30 AM -Echocardiogram pending -Has been on vancomycin cefepime and Flagyl, will follow culture and echo result, will consult ID ( addendum, case discussed with ID Dr Tommy Medal who will see patient in consult in am)  10:30 AM addendum: RARE STAPHYLOCOCCUS WARNERI  Continue IV vanc  Possible HCAP CT scan abdomen raised the question of a possible right middle and lower lobe infiltrate -patient is not hypoxic on her baseline oxygen support -broad antibiotic being used for abscess should provide adequate coverage -suspect this is likely simply atelectasis -no clinical evidence of a true pneumonia at this time  Acute metabolic encephalopathy versus chronic cognitive deficit Baseline mental status not clear but reportedly the patient is confused at baseline - B12, ammonia, and folic acid are all normal  -She is only oriented to self, not to time and following commands  Paroxysmal A. fib continue scheduled IV Lopressor until oral intake is more consistent - consider IV Cardizem drip if RVR recurs -discontinue Eliquis again today as it appears patient may require return to the OR  Hypertension Currently stable on IV Lopressor and p.o. lisinopril Other home med Norvasc held since admission  HLD Continue Lipitor  Insulin-dependent type 2 diabetes, uncontrolled with  hyperglycemia A1c 7.8 Currently on Lantus 10 subcu daily and SSI, other home meds Janumet Victoza held since admission, plan to resume at discharge A.m. glucose 199   CKD 2/anemia of chronic disease due to CKD 2 Creatinine at baseline, hemoglobin 10, no sign of bleeding  Hypokalemia/hypomagnesemia Replaced today, recheck in the  morning  QTC prolongation, keep on telemetry, follow-up EKG, keep K above 4 mag above 2 QTC 554 on presentation on April 22, repeat EKG QTC 456 on April 26 Continue monitor  Bipolar disorder Prolonged QT noted on EKG therefore quetiapine on hold -continue Depakote  MRSA screen + Continue empiric vancomycin given extensive sacral wound  Obesity Body mass index is 39.14 kg/m.   DVT Prophylaxis: Eliquis held, currently on SCD  Code Status: Full  Family Communication: patient   Disposition Plan:    Patient came from:            [facility]                                                                                               Anticipated d/c place:  Return to SNF  Barriers to d/c OR conditions which need to be met to effect a safe d/c:  Not medically ready to discharge, due to ongoing work-up and management for bacteremia sacral wound   Consultants:  General surgery  Infectious disease  Procedures: Status post I&D left buttock abscess on April 23  Antibiotics:  As above   Objective: BP (!) 150/120   Pulse 96   Temp 98.4 F (36.9 C) (Oral)   Resp (!) 27   Ht '5\' 6"'  (1.676 m)   Wt 110 kg   SpO2 97%   BMI 39.14 kg/m   Intake/Output Summary (Last 24 hours) at 12/08/2019 0905 Last data filed at 12/08/2019 0608 Gross per 24 hour  Intake 500 ml  Output 2300 ml  Net -1800 ml   Filed Weights   12/03/19 1256  Weight: 110 kg    Exam: Patient is examined daily including today on 12/08/2019, exams remain the same as of yesterday except that has changed    General:  NAD, appears confused, but calm and cooperative, following commands  Cardiovascular: RRR  Respiratory: Diminished at bases, no wheezing, no rales, no rhonchi  Abdomen: Soft/ND/NT, positive BS  Musculoskeletal: No Edema, own bilateral foot protectors  Neuro: alert, oriented to self only  Data Reviewed: Basic Metabolic Panel: Recent Labs  Lab 12/03/19 1332 12/03/19 1332  12/04/19 0326 12/04/19 1129 12/05/19 0519 12/06/19 0344 12/08/19 0355  NA 139   < > 140 141 143 142 138  K 4.5   < > 4.8 3.8 4.3 4.0 2.9*  CL 94*  --  100  --  100 100 91*  CO2 35*  --  32  --  37* 35* 38*  GLUCOSE 139*  --  139*  --  181* 106* 199*  BUN 13  --  10  --  '16 14 11  ' CREATININE 0.47  --  0.41*  --  0.41* 0.35* 0.54  CALCIUM 8.6*  --  7.9*  --  8.6* 8.4* 8.2*  MG 1.7  --   --   --  2.0  --  1.5*  PHOS  --   --   --   --  2.3*  --   --    < > = values in this interval not displayed.   Liver Function Tests: Recent Labs  Lab 12/03/19 1332 12/05/19 0519 12/06/19 0344 12/08/19 0355  AST 45* '16 16 16  ' ALT 43 '23 19 18  ' ALKPHOS 70 51 51 85  BILITOT 0.3 0.5 0.5 0.3  PROT 6.6 5.6* 5.4* 5.6*  ALBUMIN 1.5* 1.5* 1.4* 1.5*   No results for input(s): LIPASE, AMYLASE in the last 168 hours. Recent Labs  Lab 12/06/19 0344  AMMONIA 19   CBC: Recent Labs  Lab 12/03/19 1332 12/03/19 1332 12/04/19 0326 12/04/19 0326 12/04/19 1129 12/05/19 0519 12/06/19 0344 12/07/19 0510 12/08/19 0355  WBC 12.6*   < > 12.9*  --   --  10.3 10.1 13.0* 10.7*  NEUTROABS 10.8*  --   --   --   --   --   --   --   --   HGB 10.4*   < > 9.5*   < > 8.8* 8.5* 8.7* 10.2* 10.0*  HCT 36.8   < > 34.7*   < > 26.0* 30.2* 30.6* 33.9* 33.4*  MCV 97.6   < > 99.4  --   --  97.1 97.5 90.9 90.3  PLT 323   < > 328  --   --  317 310 350 360   < > = values in this interval not displayed.   Cardiac Enzymes:   No results for input(s): CKTOTAL, CKMB, CKMBINDEX, TROPONINI in the last 168 hours. BNP (last 3 results) Recent Labs    09/07/19 1950  BNP 37.6    ProBNP (last 3 results) No results for input(s): PROBNP in the last 8760 hours.  CBG: Recent Labs  Lab 12/07/19 0739 12/07/19 1159 12/07/19 1553 12/07/19 2030 12/08/19 0757  GLUCAP 111* 171* 140* 168* 140*    Recent Results (from the past 240 hour(s))  Blood Culture (routine x 2)     Status: Abnormal   Collection Time: 12/03/19  1:15 PM    Specimen: BLOOD  Result Value Ref Range Status   Specimen Description BLOOD RIGHT ANTECUBITAL  Final   Special Requests   Final    BOTTLES DRAWN AEROBIC AND ANAEROBIC Blood Culture adequate volume   Culture  Setup Time   Final    GRAM POSITIVE COCCI IN CHAINS ANAEROBIC BOTTLE ONLY CRITICAL RESULT CALLED TO, READ BACK BY AND VERIFIED WITH: Sharen Heck PharmD 10:15 12/04/19 ([email or web address])    Culture (A)  Final    VIRIDANS STREPTOCOCCUS THE SIGNIFICANCE OF ISOLATING THIS ORGANISM FROM A SINGLE SET OF BLOOD CULTURES WHEN MULTIPLE SETS ARE DRAWN IS UNCERTAIN. PLEASE NOTIFY THE MICROBIOLOGY DEPARTMENT WITHIN ONE WEEK IF SPECIATION AND SENSITIVITIES ARE REQUIRED. Performed at [facility], [place] [address], [place], [place] [address]    Report Status 12/06/2019 FINAL  Final  Blood Culture ID Panel (Reflexed)     Status: Abnormal   Collection Time: 12/03/19  1:15 PM  Result Value Ref Range Status   Enterococcus species NOT DETECTED NOT DETECTED Final   Listeria monocytogenes NOT DETECTED NOT DETECTED Final   Staphylococcus species NOT DETECTED NOT DETECTED Final   Staphylococcus aureus (BCID) NOT DETECTED NOT DETECTED Final   Streptococcus species DETECTED (A) NOT DETECTED Final    Comment:  Not Enterococcus species, Streptococcus agalactiae, Streptococcus pyogenes, or Streptococcus pneumoniae. CRITICAL RESULT CALLED TO, READ BACK BY AND VERIFIED WITH: Sharen Heck PharmD 10:15 12/04/19 ([email or web address])    Streptococcus agalactiae NOT DETECTED NOT DETECTED Final   Streptococcus pneumoniae NOT DETECTED NOT DETECTED Final   Streptococcus pyogenes NOT DETECTED NOT DETECTED Final   Acinetobacter baumannii NOT DETECTED NOT DETECTED Final   Enterobacteriaceae species NOT DETECTED NOT DETECTED Final   Enterobacter cloacae complex NOT DETECTED NOT DETECTED Final   Escherichia coli NOT DETECTED NOT DETECTED Final   Klebsiella oxytoca NOT DETECTED NOT DETECTED Final   Klebsiella pneumoniae NOT DETECTED  NOT DETECTED Final   Proteus species NOT DETECTED NOT DETECTED Final   Serratia marcescens NOT DETECTED NOT DETECTED Final   Haemophilus influenzae NOT DETECTED NOT DETECTED Final   Neisseria meningitidis NOT DETECTED NOT DETECTED Final   Pseudomonas aeruginosa NOT DETECTED NOT DETECTED Final   Candida albicans NOT DETECTED NOT DETECTED Final   Candida glabrata NOT DETECTED NOT DETECTED Final   Candida krusei NOT DETECTED NOT DETECTED Final   Candida parapsilosis NOT DETECTED NOT DETECTED Final   Candida tropicalis NOT DETECTED NOT DETECTED Final    Comment: Performed at [facility], [place]. [address], [place], [place] [address]  Blood Culture (routine x 2)     Status: None   Collection Time: 12/03/19  1:33 PM   Specimen: BLOOD LEFT WRIST  Result Value Ref Range Status   Specimen Description BLOOD LEFT WRIST  Final   Special Requests   Final    BOTTLES DRAWN AEROBIC ONLY Blood Culture results may not be optimal due to an inadequate volume of blood received in culture bottles   Culture   Final    NO GROWTH 5 DAYS Performed at [facility], [place] [address], [place], [place] [address]    Report Status 12/08/2019 FINAL  Final  Urine culture     Status: None   Collection Time: 12/03/19  5:20 PM   Specimen: In/Out Cath Urine  Result Value Ref Range Status   Specimen Description IN/OUT CATH URINE  Final   Special Requests NONE  Final   Culture   Final    NO GROWTH Performed at [facility], [place] [address] [place], [place] [address]    Report Status 12/05/2019 FINAL  Final  Respiratory Panel by RT PCR (Flu A&B, Covid) - Nasopharyngeal Swab     Status: None   Collection Time: 12/03/19 11:17 PM   Specimen: Nasopharyngeal Swab  Result Value Ref Range Status   SARS Coronavirus 2 by RT PCR NEGATIVE NEGATIVE Final    Comment: (NOTE) SARS-CoV-2 target nucleic acids are NOT DETECTED. The SARS-CoV-2 RNA is generally detectable in upper respiratoy specimens during the acute  phase of infection. The lowest concentration of SARS-CoV-2 viral copies this assay can detect is 131 copies/mL. A negative result does not preclude SARS-Cov-2 infection and should not be used as the sole basis for treatment or other patient management decisions. A negative result may occur with  improper specimen collection/handling, submission of specimen other than nasopharyngeal swab, presence of viral mutation(s) within the areas targeted by this assay, and inadequate number of viral copies (<131 copies/mL). A negative result must be combined with clinical observations, patient history, and epidemiological information. The expected result is Negative. Fact Sheet for Patients:  PinkCheek.be Fact Sheet for Healthcare Providers:  GravelBags.it This test is not yet ap proved or cleared by the [place] and  has been authorized for detection and/or diagnosis of SARS-CoV-2 by FDA under an Emergency Use Authorization (EUA). This EUA will remain  in effect (meaning this test can be used) for the duration of the COVID-19 declaration under Section 564(b)(1) of the Act, 21 U.S.C. section 360bbb-3(b)(1), unless the authorization is terminated or revoked sooner.    Influenza A by PCR NEGATIVE NEGATIVE Final   Influenza B by PCR NEGATIVE NEGATIVE Final    Comment: (NOTE) The Xpert Xpress SARS-CoV-2/FLU/RSV assay is intended as an aid in  the diagnosis of influenza from Nasopharyngeal swab specimens and  should not be used as a sole basis for treatment. Nasal washings and  aspirates are unacceptable for Xpert Xpress SARS-CoV-2/FLU/RSV  testing. Fact Sheet for Patients: PinkCheek.be Fact Sheet for Healthcare Providers: GravelBags.it This test is not yet approved or cleared by the [place] FDA and  has been authorized for detection and/or diagnosis of SARS-CoV-2 by   FDA under an Emergency Use Authorization (EUA). This EUA will remain  in effect (meaning this test can be used) for the duration of the  Covid-19 declaration under Section 564(b)(1) of the Act, 21  U.S.C. section 360bbb-3(b)(1), unless the authorization is  terminated or revoked. Performed at [facility], [place] [address], [place], [place] [address]   MRSA PCR Screening     Status: Abnormal   Collection Time: 12/04/19  6:13 AM   Specimen: Nasal Mucosa; Nasopharyngeal  Result Value Ref Range Status   MRSA by PCR POSITIVE (A) NEGATIVE Final    Comment:        The GeneXpert MRSA Assay (FDA approved for NASAL specimens only), is one component of a comprehensive MRSA colonization surveillance program. It is not intended to diagnose MRSA infection nor to guide or monitor treatment for MRSA infections. RESULT CALLED TO, READ BACK BY AND VERIFIED WITH: Diannia Ruder RN 8:40 12/04/19 ([email or web address]) Performed at [facility], [place] [address] [place], [place] [address]   Aerobic/Anaerobic Culture (surgical/deep wound)     Status: None (Preliminary result)   Collection Time: 12/04/19 10:21 AM   Specimen: PATH Other; Tissue  Result Value Ref Range Status   Specimen Description TISSUE  Final   Special Requests LEFT BUTTOCK ABSCESS SPEC A  Final   Gram Stain   Final    RARE WBC PRESENT,BOTH PMN AND MONONUCLEAR NO ORGANISMS SEEN    Culture   Final    CULTURE REINCUBATED FOR BETTER GROWTH Performed at [facility], [address] [place], [place] [address]    Report Status PENDING  Incomplete     Studies: No results found.  Scheduled Meds: . ascorbic acid  500 mg Oral BID  . atorvastatin  10 mg Oral Daily  . calcium carbonate  1 tablet Oral Daily  . Chlorhexidine Gluconate Cloth  6 each Topical Q0600  . cholecalciferol  1,000 Units Oral Daily  . divalproex  125 mg Oral QHS  . divalproex  500 mg Oral Daily  . insulin aspart  0-20 Units Subcutaneous TID WC  . insulin  aspart  0-5 Units Subcutaneous QHS  . insulin glargine  10 Units Subcutaneous Daily  . lisinopril  10 mg Oral Daily  . metoprolol tartrate  5 mg Intravenous Q8H  . mupirocin ointment  1 application Nasal BID  . potassium chloride  40 mEq Oral Q4H  . senna-docusate  1 tablet Oral Daily  . sodium chloride flush  10-40 mL Intracatheter Q12H    Continuous  Infusions: . ceFEPime (MAXIPIME) IV 2 g (12/08/19 0501)  . magnesium sulfate bolus IVPB    . metronidazole 500 mg (12/08/19 6047)  . vancomycin 1,500 mg (12/07/19 1057)     Time spent: 25 mins I have personally reviewed and interpreted on  12/08/2019 daily labs, tele strips, imagings as discussed above under date review session and assessment and plans.  I reviewed all nursing notes, pharmacy notes, consultant notes,  vitals, pertinent old records  I have discussed plan of care as described above with RN , patient  on 12/08/2019   Florencia Reasons MD, PhD, FACP  [facility]  Available via Epic secure chat 7am-7pm for nonurgent issues Please page for urgent issues, pager number available through [email or web address] .   12/08/2019, 9:05 AM  LOS: 5 days

## 2019-12-08 NOTE — NC FL2 (Signed)
[place] LEVEL OF CARE SCREENING TOOL     IDENTIFICATION  Patient Name: Norine Reddington Birthdate: 02-11-1946 Sex: female Admission Date (Current Location): 12/03/2019  [facility] and [place] Number:  Herbalist and Address:  [facility], [place] [address], [place], [place] [address]      Provider Number: [id number]  Attending Physician Name and Address:  Florencia Reasons, MD  Relative Name and Phone Number:       Current Level of Care: Hospital Recommended Level of Care: [place] Prior Approval Number:    Date Approved/Denied:   PASRR Number: pending  Discharge Plan: SNF    Current Diagnoses: Patient Active Problem List   Diagnosis Date Noted  . Severe sepsis ([place]) 12/03/2019  . Osteomyelitis ([place]) 12/03/2019  . Abscess of multiple sites of buttock 12/03/2019  . AF (paroxysmal atrial fibrillation) ([place]) 12/03/2019  . Respiratory failure with hypoxia ([place]) 09/15/2019  . Pneumonia due to COVID-19 virus 09/15/2019  . Acute metabolic encephalopathy 84/13/2440  . UTI (urinary tract infection), bacterial 09/15/2019  . Pressure injury of skin 09/10/2019  . AKI (acute kidney injury) ([place])   . Acute respiratory distress syndrome (ARDS) due to COVID-19 virus ([place]) 09/07/2019    Orientation RESPIRATION BLADDER Height & Weight     Self  Normal Incontinent Weight: 242 lb 8.1 oz (110 kg) Height:  5\' 6"  (167.6 cm)  BEHAVIORAL SYMPTOMS/MOOD NEUROLOGICAL BOWEL NUTRITION STATUS      Incontinent Diet(See discharge summery)  AMBULATORY STATUS COMMUNICATION OF NEEDS Skin   Extensive Assist   Wound Vac                       Personal Care Assistance Level of Assistance  Bathing, Feeding, Dressing Bathing Assistance: Maximum assistance Feeding assistance: Independent Dressing Assistance: Maximum assistance     Functional Limitations Info             SPECIAL CARE FACTORS FREQUENCY  OT (By licensed OT), PT (By licensed  PT)     PT Frequency: 5x a week OT Frequency: 5x a week            Contractures Contractures Info: Not present    Additional Factors Info  Code Status, Allergies Code Status Info: Full Allergies Info: NKA           Current Medications (12/08/2019):  This is the current hospital active medication list Current Facility-Administered Medications  Medication Dose Route Frequency Provider Last Rate Last Admin  . acetaminophen (TYLENOL) tablet 650 mg  650 mg Oral Q6H PRN Cherene Altes, MD   650 mg at 12/05/19 1637  . ascorbic acid (VITAMIN C) tablet 500 mg  500 mg Oral BID Georganna Skeans, MD   500 mg at 12/08/19 1006  . atorvastatin (LIPITOR) tablet 10 mg  10 mg Oral Daily Georganna Skeans, MD   10 mg at 12/08/19 1006  . calcium carbonate (TUMS - dosed in mg elemental calcium) chewable tablet 200 mg of elemental calcium  1 tablet Oral Daily Georganna Skeans, MD   200 mg of elemental calcium at 12/08/19 1006  . ceFEPIme (MAXIPIME) 2 g in sodium chloride 0.9 % 100 mL IVPB  2 g Intravenous Q8H Georganna Skeans, MD 200 mL/hr at 12/08/19 1330 2 g at 12/08/19 1330  . Chlorhexidine Gluconate Cloth 2 % PADS 6 each  6 each Topical Q0600 Cherene Altes, MD   6 each at 12/08/19 0551  . cholecalciferol (VITAMIN  D3) tablet 1,000 Units  1,000 Units Oral Daily Violeta Gelinas, MD   1,000 Units at 12/08/19 1006  . divalproex (DEPAKOTE SPRINKLE) capsule 125 mg  125 mg Oral QHS Violeta Gelinas, MD   125 mg at 12/07/19 2100  . divalproex (DEPAKOTE) DR tablet 500 mg  500 mg Oral Daily Violeta Gelinas, MD   500 mg at 12/08/19 1005  . fentaNYL (SUBLIMAZE) injection 12.5 mcg  12.5 mcg Intravenous Q2H PRN Lonia Blood, MD   12.5 mcg at 12/07/19 2141  . insulin aspart (novoLOG) injection 0-20 Units  0-20 Units Subcutaneous TID WC Lonia Blood, MD   11 Units at 12/08/19 1230  . insulin aspart (novoLOG) injection 0-5 Units  0-5 Units Subcutaneous QHS Jetty Duhamel T, MD      . insulin glargine  (LANTUS) injection 10 Units  10 Units Subcutaneous Daily Lonia Blood, MD   10 Units at 12/08/19 1004  . ipratropium-albuterol (DUONEB) 0.5-2.5 (3) MG/3ML nebulizer solution 3 mL  3 mL Inhalation Q4H PRN Lonia Blood, MD      . lisinopril (ZESTRIL) tablet 10 mg  10 mg Oral Daily Lonia Blood, MD   10 mg at 12/08/19 1007  . metoprolol tartrate (LOPRESSOR) injection 5 mg  5 mg Intravenous Q8H Lonia Blood, MD   5 mg at 12/08/19 1311  . metroNIDAZOLE (FLAGYL) IVPB 500 mg  500 mg Intravenous Q8H Lonia Blood, MD 100 mL/hr at 12/08/19 1338 500 mg at 12/08/19 1338  . mupirocin ointment (BACTROBAN) 2 % 1 application  1 application Nasal BID Lonia Blood, MD   1 application at 12/08/19 1008  . oxyCODONE (Oxy IR/ROXICODONE) immediate release tablet 5-10 mg  5-10 mg Oral Q3H PRN Lonia Blood, MD   10 mg at 12/08/19 1005  . potassium chloride SA (KLOR-CON) CR tablet 40 mEq  40 mEq Oral Q4H Albertine Grates, MD   40 mEq at 12/08/19 1322  . senna-docusate (Senokot-S) tablet 1 tablet  1 tablet Oral Daily Violeta Gelinas, MD   1 tablet at 12/07/19 1032  . sodium chloride flush (NS) 0.9 % injection 10-40 mL  10-40 mL Intracatheter Q12H Lonia Blood, MD   10 mL at 12/07/19 2100  . sodium chloride flush (NS) 0.9 % injection 10-40 mL  10-40 mL Intracatheter PRN Lonia Blood, MD      . traMADol Janean Sark) tablet 50 mg  50 mg Oral Q6H PRN Lonia Blood, MD      . vancomycin (VANCOREADY) IVPB 1500 mg/300 mL  1,500 mg Intravenous Q24H Violeta Gelinas, MD 150 mL/hr at 12/08/19 0924 1,500 mg at 12/08/19 4332     Discharge Medications: Please see discharge summary for a list of discharge medications.  Relevant Imaging Results:  Relevant Lab Results:   Additional Information [id number]  Jimmy Picket, [place]

## 2019-12-08 NOTE — Progress Notes (Signed)
  Echocardiogram 2D Echocardiogram has been performed.  Becky Hendricks 12/08/2019, 10:51 AM

## 2019-12-08 NOTE — Progress Notes (Signed)
4 Days Post-Op  Subjective: CC: Patient denies any complaints. Dressing change this AM with patients nurse.   Objective: Vital signs in last 24 hours: Temp:  [97.7 F (36.5 C)-99 F (37.2 C)] 98.4 F (36.9 C) (04/27 0400) Pulse Rate:  [96-97] 96 (04/26 1217) Resp:  [20-28] 20 (04/27 0400) BP: (115-177)/(69-98) 154/98 (04/27 0400) SpO2:  [97 %-98 %] 97 % (04/27 0400)    Intake/Output from previous day: 04/26 0701 - 04/27 0700 In: 500 [IV Piggyback:500] Out: 2300 [Urine:2300] Intake/Output this shift: No intake/output data recorded.  PE: Gen:  Awake, NAD Pulm: On o2, normal rate and effort  Abd: Soft, NT/ND Psych: A&Ox1 Skin: no rashes noted, warm and dry Wound: No change from pictures on 4/26. Chaperone present, RN x 2. Packing removed from two left buttock incisions measuring 5cm in length. Dressing clean on superior incision. Superior surgical wound with healthy appearing tissue at the base. Inferior of two surgical incisions with purulence on dressing with a small amount of purulence draining after dressing was removed. No loculations. I was able to probe with a sterile cotton swab and the two surgical incisions do connect. No surrounding erythema, heat, induration or fluctuance of either surgical wounds Sacral wound measuring 6cm x 4cm. Dressing saturated with purulent material. Base of wound with healthy appearing granulation tissue. Sacrum is palpable. There is active purulent dressing from the wound that is expressed from the wound when pressure is applied 2-3cm above the wound at the 9 o'clock position (towards left buttock). There is no surrounding skin changes, induration, obvious fluctuance. No wound tracking with probing of a sterile cotton swab.    Lab Results:  Recent Labs    12/07/19 0510 12/08/19 0355  WBC 13.0* 10.7*  HGB 10.2* 10.0*  HCT 33.9* 33.4*  PLT 350 360   BMET Recent Labs    12/06/19 0344 12/08/19 0355  NA 142 138  K 4.0 2.9*  CL 100  91*  CO2 35* 38*  GLUCOSE 106* 199*  BUN 14 11  CREATININE 0.35* 0.54  CALCIUM 8.4* 8.2*   PT/INR No results for input(s): LABPROT, INR in the last 72 hours. CMP     Component Value Date/Time   NA 138 12/08/2019 0355   K 2.9 (L) 12/08/2019 0355   CL 91 (L) 12/08/2019 0355   CO2 38 (H) 12/08/2019 0355   GLUCOSE 199 (H) 12/08/2019 0355   BUN 11 12/08/2019 0355   CREATININE 0.54 12/08/2019 0355   CALCIUM 8.2 (L) 12/08/2019 0355   PROT 5.6 (L) 12/08/2019 0355   ALBUMIN 1.5 (L) 12/08/2019 0355   AST 16 12/08/2019 0355   ALT 18 12/08/2019 0355   ALKPHOS 85 12/08/2019 0355   BILITOT 0.3 12/08/2019 0355   GFRNONAA >60 12/08/2019 0355   GFRAA >60 12/08/2019 0355   Lipase  No results found for: LIPASE     Studies/Results: No results found.  Anti-infectives: Anti-infectives (From admission, onward)   Start     Dose/Rate Route Frequency Ordered Stop   12/05/19 1500  metroNIDAZOLE (FLAGYL) IVPB 500 mg     500 mg 100 mL/hr over 60 Minutes Intravenous Every 8 hours 12/05/19 1342     12/04/19 1430  vancomycin (VANCOREADY) IVPB 1500 mg/300 mL     1,500 mg 150 mL/hr over 120 Minutes Intravenous Every 24 hours 12/03/19 1435     12/03/19 2130  ceFEPIme (MAXIPIME) 2 g in sodium chloride 0.9 % 100 mL IVPB     2 g 200  mL/hr over 30 Minutes Intravenous Every 8 hours 12/03/19 1435     12/03/19 1330  ceFEPIme (MAXIPIME) 2 g in sodium chloride 0.9 % 100 mL IVPB     2 g 200 mL/hr over 30 Minutes Intravenous  Once 12/03/19 1307 12/03/19 1559   12/03/19 1315  metroNIDAZOLE (FLAGYL) IVPB 500 mg     500 mg 100 mL/hr over 60 Minutes Intravenous  Once 12/03/19 1307 12/03/19 1559   12/03/19 1315  vancomycin (VANCOCIN) IVPB 1000 mg/200 mL premix  Status:  Discontinued     1,000 mg 200 mL/hr over 60 Minutes Intravenous  Once 12/03/19 1307 12/03/19 1311   12/03/19 1315  vancomycin (VANCOCIN) 2,500 mg in sodium chloride 0.9 % 500 mL IVPB     2,500 mg 250 mL/hr over 120 Minutes Intravenous  Once  12/03/19 1311 12/03/19 1658       Assessment/Plan Atrial fibrillation on Eliquis Baseline confusion/bipolar disorder Type 2 diabetes Chronic respiratory failure on home O2 Covid infection January 2021 Possible HCAP  Sepsis Bacteremia - strep viridans  Left buttocks abscess Stage IV pressure ulcers buttocks S/p I&D of left buttocks abscess 12/04/2019 Dr. Georganna Skeans - POD #4 - Wound is actively draining purulent material out of sacral wound. Cont BID dressing changes. Await Eliquis to wear off. Suspect will need further evaluation in OR - Air mattress, frequent turns - PT  - Cont abx. Await cx's - reincubated   FEN: Carb modified diet ID: Flagyl x1 dose 4/22; cefepime/vancomycin 4/22>>Flagyl 4/24 >> WBC 10.7 DVT: SCDs;Eliquis held 4/26 Follow-up: Wound care   LOS: 5 days    Jillyn Ledger , [facility] 12/08/2019, 8:07 AM Please see Amion for pager number during day hours 7:00am-4:30pm

## 2019-12-08 NOTE — Progress Notes (Addendum)
Fed patient 75% of dinner tray and vanilla ensure.

## 2019-12-08 NOTE — TOC Initial Note (Signed)
Transition of Care [facility]) - Initial/Assessment Note    Patient Details  Name: Becky Hendricks MRN: [id number] Date of Birth: October 18, 1945  [facility] CM/SW Contact:    Emeterio Reeve, [place] Phone Number: 12/08/2019, 2:43 PM  Clinical Narrative:                  CSW met with pt at bedside. CSW introduced self and explained her role at the hospital. CSW spoke to pts brother [name] via phone. [name] stated that pt has had a rough couple of months. [name] stated that in December the pt fell in her condo and was down for about 2 days when he went to check on her. PT went to [facility] via EMS, after leaving [facility] pt went to [facility] for rehab, while at [facility] pt caught covid and eventually went to [facility]. At [facility] it was discovered that pt had large wound on butt that then required surgery. After [place] pt went to [facility], at [facility] it was discovered that the infection spread which required more surgery. [name] stated overall he was happy with her care at [facility] and would like for her to go back.  PT/OT are recommending snf placement. FL2 ompleted and pt will be faxed back out to [facility]. Geoge requested to be informed of discharge date so he can visit her prior to her discharge.   CSW will continue to follow.   Expected Discharge Plan: Skilled Nursing Facility Barriers to Discharge: Continued Medical Work up   Patient Goals and CMS Choice Patient states their goals for this hospitalization and ongoing recovery are:: Pt is unable to verbalize goals. CMS Medicare.gov Compare Post Acute Care list provided to:: Patient Represenative (must comment)(Brother- Marcelino Duster) Choice offered to / list presented to : Patient, Sibling  Expected Discharge Plan and Services Expected Discharge Plan: [place] arrangements for the past 2 months: [place]                                       Prior Living Arrangements/Services Living arrangements for the past 2 months: [place] Lives with:: Facility Resident Patient language and need for interpreter reviewed:: Yes Do you feel safe going back to the place where you live?: Yes      Need for Family Participation in Patient Care: Yes (Comment) Care giver support system in place?: Yes (comment)   Criminal Activity/Legal Involvement Pertinent to Current Situation/Hospitalization: No - Comment as needed  Activities of Daily Living      Permission Sought/Granted Permission sought to share information with : Family Supports Permission granted to share information with : (unable to give permission)  Share Information with NAME: Maricel Swartzendruber  Permission granted to share info w AGENCY: SNF  Permission granted to share info w Relationship: Brother  Permission granted to share info w Contact Information: [phone]  Emotional Assessment Appearance:: Appears stated age Attitude/Demeanor/Rapport: Unable to Assess Affect (typically observed): Unable to Assess Orientation: : Oriented to Self Alcohol / Substance Use: Not Applicable Psych Involvement: No (comment)  Admission diagnosis:  Wound of sacral region, initial encounter [S31.000A] Severe sepsis ([place]) [A41.9, R65.20] Sepsis, due to unspecified organism, unspecified whether acute organ dysfunction present [facility]) [A41.9] Patient Active Problem List   Diagnosis Date Noted  . Severe sepsis ([place]) 12/03/2019  .  Osteomyelitis ([place]) 12/03/2019  . Abscess of multiple sites of buttock 12/03/2019  . AF (paroxysmal atrial fibrillation) ([place]) 12/03/2019  . Respiratory failure with hypoxia ([place]) 09/15/2019  . Pneumonia due to COVID-19 virus 09/15/2019  . Acute metabolic encephalopathy 93/57/0177  . UTI (urinary tract infection), bacterial 09/15/2019  . Pressure injury of skin 09/10/2019  . AKI (acute kidney injury) ([place])   . Acute respiratory  distress syndrome (ARDS) due to COVID-19 virus ([place]) 09/07/2019   PCP:  [name], MD Pharmacy:  No Pharmacies Listed    Social Determinants of Health (SDOH) Interventions    Readmission Risk Interventions No flowsheet data found.  Emeterio Reeve, Latanya Presser, [place] Social Worker [phone]

## 2019-12-09 ENCOUNTER — Inpatient Hospital Stay (HOSPITAL_COMMUNITY): Payer: Medicare Other

## 2019-12-09 ENCOUNTER — Other Ambulatory Visit (HOSPITAL_COMMUNITY): Payer: Medicare Other

## 2019-12-09 DIAGNOSIS — Z87440 Personal history of urinary (tract) infections: Secondary | ICD-10-CM

## 2019-12-09 DIAGNOSIS — F319 Bipolar disorder, unspecified: Secondary | ICD-10-CM

## 2019-12-09 DIAGNOSIS — Z7401 Bed confinement status: Secondary | ICD-10-CM

## 2019-12-09 DIAGNOSIS — Z8616 Personal history of COVID-19: Secondary | ICD-10-CM

## 2019-12-09 DIAGNOSIS — L89314 Pressure ulcer of right buttock, stage 4: Secondary | ICD-10-CM | POA: Diagnosis not present

## 2019-12-09 DIAGNOSIS — L89159 Pressure ulcer of sacral region, unspecified stage: Secondary | ICD-10-CM

## 2019-12-09 DIAGNOSIS — I10 Essential (primary) hypertension: Secondary | ICD-10-CM

## 2019-12-09 DIAGNOSIS — M4628 Osteomyelitis of vertebra, sacral and sacrococcygeal region: Secondary | ICD-10-CM

## 2019-12-09 DIAGNOSIS — E119 Type 2 diabetes mellitus without complications: Secondary | ICD-10-CM

## 2019-12-09 DIAGNOSIS — I480 Paroxysmal atrial fibrillation: Secondary | ICD-10-CM

## 2019-12-09 DIAGNOSIS — Z7901 Long term (current) use of anticoagulants: Secondary | ICD-10-CM

## 2019-12-09 DIAGNOSIS — Z8719 Personal history of other diseases of the digestive system: Secondary | ICD-10-CM

## 2019-12-09 DIAGNOSIS — R0902 Hypoxemia: Secondary | ICD-10-CM

## 2019-12-09 DIAGNOSIS — E669 Obesity, unspecified: Secondary | ICD-10-CM

## 2019-12-09 LAB — BASIC METABOLIC PANEL
Anion gap: 7 (ref 5–15)
BUN: 8 mg/dL (ref 8–23)
CO2: 38 mmol/L — ABNORMAL HIGH (ref 22–32)
Calcium: 8.2 mg/dL — ABNORMAL LOW (ref 8.9–10.3)
Chloride: 97 mmol/L — ABNORMAL LOW (ref 98–111)
Creatinine, Ser: 0.37 mg/dL — ABNORMAL LOW (ref 0.44–1.00)
GFR calc Af Amer: >60 mL/min (ref >60)
GFR calc non Af Amer: >60 mL/min (ref >60)
Glucose, Bld: 173 mg/dL — ABNORMAL HIGH (ref 70–99)
Potassium: 4.2 mmol/L (ref 3.5–5.1)
Sodium: 142 mmol/L (ref 135–145)

## 2019-12-09 LAB — GLUCOSE, CAPILLARY
Glucose-Capillary: 155 mg/dL — ABNORMAL HIGH (ref 70–99)
Glucose-Capillary: 249 mg/dL — ABNORMAL HIGH (ref 70–99)
Glucose-Capillary: 149 mg/dL — ABNORMAL HIGH (ref 70–99)
Glucose-Capillary: 184 mg/dL — ABNORMAL HIGH (ref 70–99)

## 2019-12-09 LAB — AEROBIC/ANAEROBIC CULTURE W GRAM STAIN (SURGICAL/DEEP WOUND)

## 2019-12-09 LAB — CBC WITH DIFFERENTIAL/PLATELET
Abs Immature Granulocytes: 0.5 10*3/uL — ABNORMAL HIGH (ref 0.00–0.07)
Basophils Absolute: 0.1 10*3/uL (ref 0.0–0.1)
Basophils Relative: 1 %
Eosinophils Absolute: 0.3 10*3/uL (ref 0.0–0.5)
Eosinophils Relative: 3 %
HCT: 32.2 % — ABNORMAL LOW (ref 36.0–46.0)
Hemoglobin: 9.6 g/dL — ABNORMAL LOW (ref 12.0–15.0)
Immature Granulocytes: 5 %
Lymphocytes Relative: 19 %
Lymphs Abs: 2.1 10*3/uL (ref 0.7–4.0)
MCH: 27.7 pg (ref 26.0–34.0)
MCHC: 29.8 g/dL — ABNORMAL LOW (ref 30.0–36.0)
MCV: 93.1 fL (ref 80.0–100.0)
Monocytes Absolute: 1.1 10*3/uL — ABNORMAL HIGH (ref 0.1–1.0)
Monocytes Relative: 10 %
Neutro Abs: 7.1 10*3/uL (ref 1.7–7.7)
Neutrophils Relative %: 62 %
Platelets: 358 10*3/uL (ref 150–400)
RBC: 3.46 MIL/uL — ABNORMAL LOW (ref 3.87–5.11)
RDW: 16 % — ABNORMAL HIGH (ref 11.5–15.5)
WBC: 11.1 10*3/uL — ABNORMAL HIGH (ref 4.0–10.5)
nRBC: 0.6 % — ABNORMAL HIGH (ref 0.0–0.2)

## 2019-12-09 LAB — MAGNESIUM: Magnesium: 1.8 mg/dL (ref 1.7–2.4)

## 2019-12-09 IMAGING — DX DG CHEST 1V PORT
1 series · 1 of 1 positions shown · non-contrast
Comparison: [DATE]

CLINICAL DATA: Hypoxia

EXAM:
PORTABLE CHEST 1 VIEW

[chest ap]
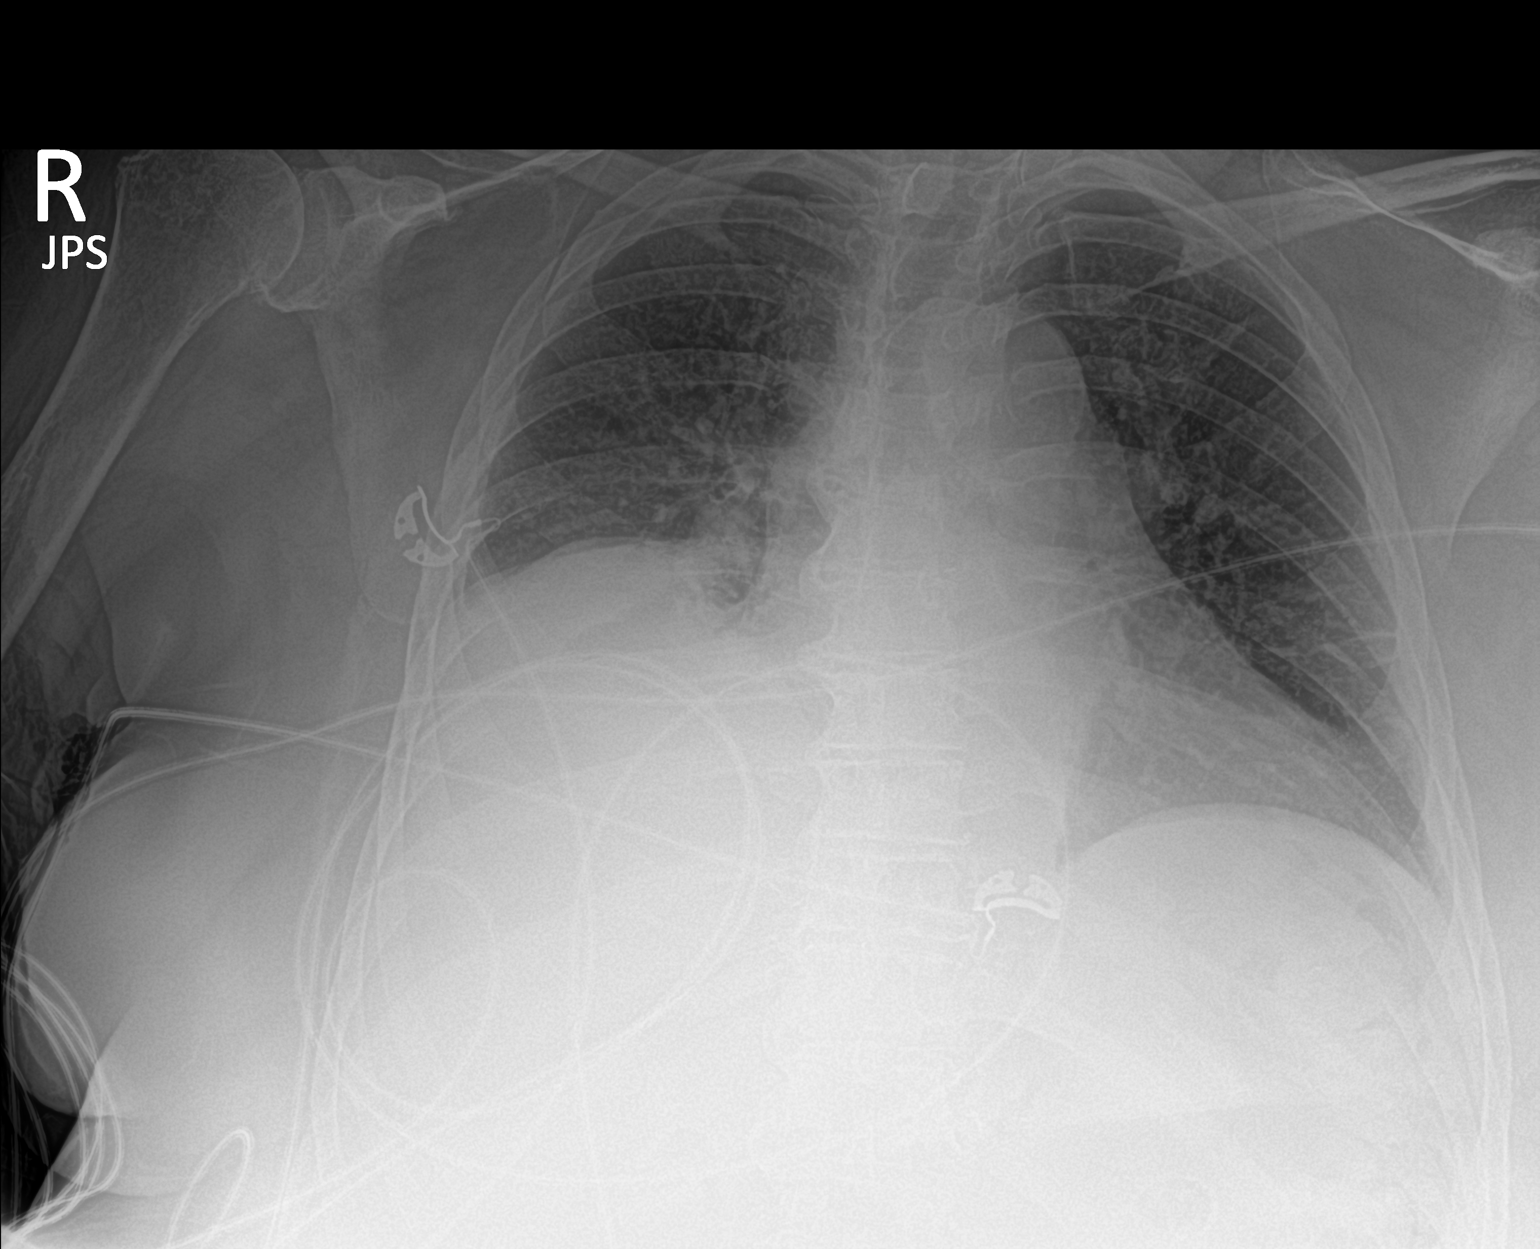

[1 of 1 positions shown; findings below may reference images not displayed]

FINDINGS: Elevation of the right hemidiaphragm. Right base atelectasis. No
confluent opacity on the left. Mild cardiomegaly. No effusions or
edema. No acute bony abnormality.
IMPRESSION: Stable elevation of the right hemidiaphragm with right base
atelectasis.

## 2019-12-09 MED ORDER — GUAIFENESIN ER 600 MG PO TB12
600.0000 mg | ORAL_TABLET | Freq: Two times a day (BID) | ORAL | Status: DC
  Administered 2019-12-09 – 2019-12-17 (×15): 600 mg via ORAL
  Filled 2019-12-09 (×16): qty 1

## 2019-12-09 MED ORDER — SODIUM CHLORIDE 0.9 % IV SOLN
3.0000 g | Freq: Four times a day (QID) | INTRAVENOUS | Status: DC
  Administered 2019-12-09 – 2019-12-17 (×32): 3 g via INTRAVENOUS
  Filled 2019-12-09: qty 3
  Filled 2019-12-09: qty 8
  Filled 2019-12-09 (×3): qty 3
  Filled 2019-12-09 (×2): qty 8
  Filled 2019-12-09 (×4): qty 3
  Filled 2019-12-09 (×3): qty 8
  Filled 2019-12-09 (×2): qty 3
  Filled 2019-12-09 (×2): qty 8
  Filled 2019-12-09 (×2): qty 3
  Filled 2019-12-09: qty 8
  Filled 2019-12-09: qty 3
  Filled 2019-12-09: qty 8
  Filled 2019-12-09: qty 3
  Filled 2019-12-09: qty 8
  Filled 2019-12-09: qty 3
  Filled 2019-12-09 (×3): qty 8
  Filled 2019-12-09 (×2): qty 3
  Filled 2019-12-09: qty 8
  Filled 2019-12-09: qty 3
  Filled 2019-12-09: qty 8
  Filled 2019-12-09 (×2): qty 3
  Filled 2019-12-09 (×2): qty 8
  Filled 2019-12-09: qty 3

## 2019-12-09 MED ORDER — LABETALOL HCL 5 MG/ML IV SOLN
10.0000 mg | INTRAVENOUS | Status: DC | PRN
  Administered 2019-12-09 – 2019-12-13 (×3): 10 mg via INTRAVENOUS
  Filled 2019-12-09 (×3): qty 4

## 2019-12-09 MED ORDER — AMLODIPINE BESYLATE 5 MG PO TABS
5.0000 mg | ORAL_TABLET | Freq: Every day | ORAL | Status: DC
  Administered 2019-12-09: 5 mg via ORAL
  Filled 2019-12-09: qty 1

## 2019-12-09 MED ORDER — CARVEDILOL 25 MG PO TABS
25.0000 mg | ORAL_TABLET | Freq: Two times a day (BID) | ORAL | Status: DC
  Administered 2019-12-09 – 2019-12-17 (×13): 25 mg via ORAL
  Filled 2019-12-09 (×15): qty 1

## 2019-12-09 NOTE — Progress Notes (Signed)
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  Systolics were in the 13Y at the facility.  She has a known chronic sacral wound for which she has been receiving Rocephin for 1 week.  Her urine was noted to be foul-smelling and she has a history of recurrent UTIs.  In the ED she was found to have a temperature of 100.2.  She was tachycardic and tachypneic up to 40 with systolics in the 86V.  UA was not consistent with infection.  She was noted to have a very large deep sacral wound with purulent drainage.  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   Significant Events: 4/22 admit via [place] from [facility] 4/23 I&D left buttock abscess in OR  Antimicrobials:  Cefepime 4/22 > Flagyl 4/22 > Vancomycin 4/22 >    HPI/Recap of past 24 hours:  She is confused, not a reliable historian, she does follow commands She denies pain She is on 2 L of oxygen, no hypoxia documented at rest, no cough, but does appear tachypneic  Telemetry last 24 hours has been sinus rhythm  Per RN Puriwick is not able to secure in place due to body habitus, stool is not loose enough to warrant rectal tube   Assessment/Plan: Principal Problem:   Osteomyelitis ([place]) Active Problems:   Acute metabolic encephalopathy   Severe sepsis ([place])   Abscess of multiple sites of buttock   AF (paroxysmal atrial fibrillation) ([place])   Sepsis presented on admission with , left buttock abscess, stage IV pressure ulcer  present on admission Status post I&D left buttock abscess on April 23, wound is actively draining purulent material, general surgery plan to further evaluation the OR once adequate is aware of, last dose of Eliquis was given on April 20 6 in the morning around 10:30 AM Wound culture grew RARE STAPHYLOCOCCUS WARNERI  Streptococcus viridans only in one of the anaerobic bottle only -Echocardiogram no mention of vegation -Has been on vancomycin cefepime and Flagyl,  ID Dr Tommy Medal  consulted   Possible HCAP CT scan abdomen raised the question of a possible right middle and lower lobe infiltrate - patient is not hypoxic on her baseline oxygen support -by report she has chronic respiratory failure on 2 L home oxygen broad antibiotic being used for abscess should provide adequate coverage  She appear tachypneic today, though no cough, no fever, will add mucinex, incentive spirometer, repeat cxr  Acute metabolic encephalopathy versus chronic cognitive deficit Baseline mental status not clear but reportedly the patient is confused at baseline - B12, ammonia, and folic acid are all normal  -She is only oriented to self, not to time, she  following commands  Paroxysmal A. fib -change iv lopressor to higher dose of coreg due to elevated blood pressure  -Eliquis on hold for now as it appears patient may require return to the OR  Hypertension Blood pressure elevated, restart Norvasc, change Lopressor to Coreg, continue p.o. lisinopril   HLD Continue Lipitor  Insulin-dependent type 2 diabetes, uncontrolled with hyperglycemia A1c 7.8  Currently on Lantus 10 subcu daily and SSI, other home meds Janumet Victoza held since admission, plan to resume at discharge A.m. 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   DVT Prophylaxis: Eliquis held, currently on SCD  Code Status: Full  Family Communication: patient   Disposition Plan:    Patient came from:            [facility]                                                                                               Anticipated d/c place:  Return to SNF  Barriers to d/c OR conditions which need to be met to effect a safe d/c:  Not medically ready to discharge, due to ongoing work-up and management for bacteremia sacral wound   Consultants:  General surgery  Infectious disease  Procedures: Status post I&D left buttock abscess on April 23  Antibiotics:  As above   Objective: BP (!) 175/82   Pulse 96   Temp 99.7 F (37.6 C) (Axillary)   Resp (!) 28   Ht '5\' 6"'  (1.676 m)   Wt 110 kg   SpO2 94%   BMI 39.14 kg/m   Intake/Output Summary (Last 24 hours) at 12/09/2019 0815 Last data filed at 12/09/2019 7262 Gross per 24 hour  Intake 1220 ml  Output 1700 ml  Net -480 ml   Filed Weights   12/03/19 1256  Weight: 110 kg    Exam: Patient is examined daily including today on 12/09/2019, exams remain the same as of yesterday except that has changed    General:  NAD, appears confused, but calm and cooperative, following commands  Cardiovascular: RRR  Respiratory: Diminished at bases, no wheezing, no rales, no rhonchi  Abdomen: Soft/ND/NT, positive BS  Musculoskeletal: No Edema, own bilateral foot protectors  Neuro: alert, oriented to self only  Data Reviewed: Basic Metabolic Panel: Recent Labs  Lab 12/03/19 1332 12/03/19 1332 12/04/19 0326 12/04/19 0326 12/04/19 1129 12/05/19 0519 12/06/19 0344  12/08/19 0355 12/09/19 0500  NA 139   < > 140   < > 141 143 142 138 142  K 4.5   < > 4.8   < > 3.8 4.3 4.0 2.9* 4.2  CL 94*   < > 100  --   --  100 100 91* 97*  CO2 35*   < > 32  --   --  37* 35* 38* 38*  GLUCOSE 139*   < > 139*  --   --  181* 106* 199* 173*  BUN 13   < > 10  --   --  '16 14 11 8  ' CREATININE 0.47   < > 0.41*  --   --  0.41* 0.35* 0.54 0.37*  CALCIUM 8.6*   < > 7.9*  --   --  8.6* 8.4* 8.2* 8.2*  MG 1.7  --   --   --   --  2.0  --  1.5* 1.8  PHOS  --   --   --   --   --  2.3*  --   --   --    < > = values in this interval not displayed.   Liver Function Tests: Recent Labs  Lab 12/03/19 1332 12/05/19 0519 12/06/19 0344 12/08/19 0355  AST 45* '16 16 16  ' ALT 43 '23 19 18  ' ALKPHOS 70 51 51 85  BILITOT 0.3 0.5 0.5 0.3  PROT 6.6 5.6* 5.4* 5.6*  ALBUMIN 1.5* 1.5* 1.4* 1.5*   No results for input(s): LIPASE, AMYLASE in the last 168 hours. Recent Labs  Lab 12/06/19 0344  AMMONIA 19   CBC: Recent Labs  Lab 12/03/19 1332 12/04/19 0326 12/05/19 0519 12/06/19 0344 12/07/19 0510 12/08/19 0355 12/09/19 0500  WBC 12.6*   < > 10.3 10.1 13.0* 10.7* 11.1*  NEUTROABS 10.8*  --   --   --   --   --  7.1  HGB 10.4*   < > 8.5* 8.7* 10.2* 10.0* 9.6*  HCT 36.8   < > 30.2* 30.6* 33.9* 33.4* 32.2*  MCV 97.6   < > 97.1 97.5 90.9 90.3 93.1  PLT 323   < > 317 310 350 360 358   < > = values in this interval not displayed.   Cardiac Enzymes:   No results for input(s): CKTOTAL, CKMB, CKMBINDEX, TROPONINI in the last 168 hours. BNP (last 3 results) Recent Labs    09/07/19 1950  BNP 37.6    ProBNP (last 3 results) No results for input(s): PROBNP in the last 8760 hours.  CBG: Recent Labs  Lab 12/08/19 0757 12/08/19 1229 12/08/19 1615 12/08/19 2036 12/09/19 0723  GLUCAP 140* 261* 247* 225* 155*    Recent Results (from the past 240 hour(s))  Blood Culture (routine x 2)     Status: Abnormal   Collection Time: 12/03/19  1:15 PM   Specimen: BLOOD  Result Value Ref  Range Status   Specimen Description BLOOD RIGHT ANTECUBITAL  Final   Special Requests   Final    BOTTLES DRAWN AEROBIC AND ANAEROBIC Blood Culture adequate volume   Culture  Setup Time   Final    GRAM POSITIVE COCCI IN CHAINS ANAEROBIC BOTTLE ONLY CRITICAL RESULT CALLED TO, READ BACK BY AND VERIFIED WITH: Sharen Heck PharmD 10:15 12/04/19 ([email or web address])    Culture (A)  Final    VIRIDANS STREPTOCOCCUS THE SIGNIFICANCE OF ISOLATING THIS ORGANISM FROM A SINGLE SET OF BLOOD CULTURES WHEN MULTIPLE SETS ARE DRAWN IS UNCERTAIN. PLEASE NOTIFY THE MICROBIOLOGY DEPARTMENT WITHIN ONE WEEK IF SPECIATION AND SENSITIVITIES ARE REQUIRED. Performed at [facility], [place] [address], [place], [place] [address]    Report Status 12/06/2019 FINAL  Final  Blood Culture ID Panel (Reflexed)     Status: Abnormal   Collection Time: 12/03/19  1:15 PM  Result Value Ref Range Status   Enterococcus species NOT DETECTED NOT DETECTED Final   Listeria monocytogenes NOT DETECTED NOT DETECTED Final   Staphylococcus species NOT DETECTED NOT DETECTED Final   Staphylococcus aureus (BCID) NOT DETECTED NOT DETECTED Final   Streptococcus species DETECTED (A)  NOT DETECTED Final    Comment: Not Enterococcus species, Streptococcus agalactiae, Streptococcus pyogenes, or Streptococcus pneumoniae. CRITICAL RESULT CALLED TO, READ BACK BY AND VERIFIED WITH: Sharen Heck PharmD 10:15 12/04/19 ([email or web address])    Streptococcus agalactiae NOT DETECTED NOT DETECTED Final   Streptococcus pneumoniae NOT DETECTED NOT DETECTED Final   Streptococcus pyogenes NOT DETECTED NOT DETECTED Final   Acinetobacter baumannii NOT DETECTED NOT DETECTED Final   Enterobacteriaceae species NOT DETECTED NOT DETECTED Final   Enterobacter cloacae complex NOT DETECTED NOT DETECTED Final   Escherichia coli NOT DETECTED NOT DETECTED Final   Klebsiella oxytoca NOT DETECTED NOT DETECTED Final   Klebsiella pneumoniae NOT DETECTED NOT DETECTED Final   Proteus species  NOT DETECTED NOT DETECTED Final   Serratia marcescens NOT DETECTED NOT DETECTED Final   Haemophilus influenzae NOT DETECTED NOT DETECTED Final   Neisseria meningitidis NOT DETECTED NOT DETECTED Final   Pseudomonas aeruginosa NOT DETECTED NOT DETECTED Final   Candida albicans NOT DETECTED NOT DETECTED Final   Candida glabrata NOT DETECTED NOT DETECTED Final   Candida krusei NOT DETECTED NOT DETECTED Final   Candida parapsilosis NOT DETECTED NOT DETECTED Final   Candida tropicalis NOT DETECTED NOT DETECTED Final    Comment: Performed at [facility], [place]. [address], [place], [place] [address]  Blood Culture (routine x 2)     Status: None   Collection Time: 12/03/19  1:33 PM   Specimen: BLOOD LEFT WRIST  Result Value Ref Range Status   Specimen Description BLOOD LEFT WRIST  Final   Special Requests   Final    BOTTLES DRAWN AEROBIC ONLY Blood Culture results may not be optimal due to an inadequate volume of blood received in culture bottles   Culture   Final    NO GROWTH 5 DAYS Performed at [facility], [place] [address], [place], [place] [address]    Report Status 12/08/2019 FINAL  Final  Urine culture     Status: None   Collection Time: 12/03/19  5:20 PM   Specimen: In/Out Cath Urine  Result Value Ref Range Status   Specimen Description IN/OUT CATH URINE  Final   Special Requests NONE  Final   Culture   Final    NO GROWTH Performed at [facility], [place] [address], [place], [place] [address]    Report Status 12/05/2019 FINAL  Final  Respiratory Panel by RT PCR (Flu A&B, Covid) - Nasopharyngeal Swab     Status: None   Collection Time: 12/03/19 11:17 PM   Specimen: Nasopharyngeal Swab  Result Value Ref Range Status   SARS Coronavirus 2 by RT PCR NEGATIVE NEGATIVE Final    Comment: (NOTE) SARS-CoV-2 target nucleic acids are NOT DETECTED. The SARS-CoV-2 RNA is generally detectable in upper respiratoy specimens during the acute phase of infection. The  lowest concentration of SARS-CoV-2 viral copies this assay can detect is 131 copies/mL. A negative result does not preclude SARS-Cov-2 infection and should not be used as the sole basis for treatment or other patient management decisions. A negative result may occur with  improper specimen collection/handling, submission of specimen other than nasopharyngeal swab, presence of viral mutation(s) within the areas targeted by this assay, and inadequate number of viral copies (<131 copies/mL). A negative result must be combined with clinical observations, patient history, and epidemiological information. The expected result is Negative. Fact Sheet for Patients:  PinkCheek.be Fact Sheet for Healthcare Providers:  GravelBags.it This test is not yet ap proved or  cleared by the [place] and  has been authorized for detection and/or diagnosis of SARS-CoV-2 by FDA under an Emergency Use Authorization (EUA). This EUA will remain  in effect (meaning this test can be used) for the duration of the COVID-19 declaration under Section 564(b)(1) of the Act, 21 U.S.C. section 360bbb-3(b)(1), unless the authorization is terminated or revoked sooner.    Influenza A by PCR NEGATIVE NEGATIVE Final   Influenza B by PCR NEGATIVE NEGATIVE Final    Comment: (NOTE) The Xpert Xpress SARS-CoV-2/FLU/RSV assay is intended as an aid in  the diagnosis of influenza from Nasopharyngeal swab specimens and  should not be used as a sole basis for treatment. Nasal washings and  aspirates are unacceptable for Xpert Xpress SARS-CoV-2/FLU/RSV  testing. Fact Sheet for Patients: PinkCheek.be Fact Sheet for Healthcare Providers: GravelBags.it This test is not yet approved or cleared by the [place] FDA and  has been authorized for detection and/or diagnosis of SARS-CoV-2 by  FDA under an Emergency  Use Authorization (EUA). This EUA will remain  in effect (meaning this test can be used) for the duration of the  Covid-19 declaration under Section 564(b)(1) of the Act, 21  U.S.C. section 360bbb-3(b)(1), unless the authorization is  terminated or revoked. Performed at [facility], [place] [address] [place], [place] [address]   MRSA PCR Screening     Status: Abnormal   Collection Time: 12/04/19  6:13 AM   Specimen: Nasal Mucosa; Nasopharyngeal  Result Value Ref Range Status   MRSA by PCR POSITIVE (A) NEGATIVE Final    Comment:        The GeneXpert MRSA Assay (FDA approved for NASAL specimens only), is one component of a comprehensive MRSA colonization surveillance program. It is not intended to diagnose MRSA infection nor to guide or monitor treatment for MRSA infections. RESULT CALLED TO, READ BACK BY AND VERIFIED WITH: Diannia Ruder RN 8:40 12/04/19 ([email or web address]) Performed at [facility], [place] [address] [place], [place] [address]   Aerobic/Anaerobic Culture (surgical/deep wound)     Status: None (Preliminary result)   Collection Time: 12/04/19 10:21 AM   Specimen: PATH Other; Tissue  Result Value Ref Range Status   Specimen Description TISSUE  Final   Special Requests LEFT BUTTOCK ABSCESS SPEC A  Final   Gram Stain   Final    RARE WBC PRESENT,BOTH PMN AND MONONUCLEAR NO ORGANISMS SEEN    Culture   Final    RARE STAPHYLOCOCCUS WARNERI RARE STREPTOCOCCUS ANGINOSIS CRITICAL RESULT CALLED TO, READ BACK BY AND VERIFIED WITH: RN M LESSARD [id number] AT 1025 AM BY CM FEW PREVOTELLA DENTICOLA FEW BACTEROIDES THETAIOTAOMICRON ORG 4 AND 5 BETA LACTAMASE POSITIVE Performed at [facility], [place] [address], [place], [place] [address]    Report Status PENDING  Incomplete     Studies: ECHOCARDIOGRAM COMPLETE  Result Date: 12/08/2019    ECHOCARDIOGRAM REPORT   Patient Name:   Becky Hendricks Date of Exam: 12/08/2019 Medical Rec #:  [id number]      Height:       66.0 in  Accession #:    [id number]     Weight:       242.5 lb Date of Birth:  1946-06-21       BSA:          2.170 m Patient Age:    74 years       BP:           150/120  mmHg Patient Gender: F              HR:           96 bpm. Exam Location:  Inpatient Procedure: 2D Echo, Cardiac Doppler and Color Doppler Indications:    Endocarditis I38  History:        Patient has no prior history of Echocardiogram examinations.                 Arrythmias:Atrial Fibrillation; Risk Factors:Hypertension and                 Diabetes. Sepsis. Oseomyelitis.  Sonographer:    Clayton Lefort RDCS (AE) Referring Phys: [id number] JEFFREY [facility]  Sonographer Comments: Limited patient mobility. IMPRESSIONS  1. Normal LV systolic function; grade 1 diastolic dysfunction; mild LVH.  2. Left ventricular ejection fraction, by estimation, is 60 to 65%. The left ventricle has normal function. The left ventricle has no regional wall motion abnormalities. There is mild left ventricular hypertrophy. Left ventricular diastolic parameters are consistent with Grade I diastolic dysfunction (impaired relaxation).  3. Right ventricular systolic function is normal. The right ventricular size is normal. There is mildly elevated pulmonary artery systolic pressure.  4. The mitral valve is normal in structure. Trivial mitral valve regurgitation. No evidence of mitral stenosis.  5. The aortic valve is tricuspid. Aortic valve regurgitation is not visualized. No aortic stenosis is present.  6. The inferior vena cava is normal in size with greater than 50% respiratory variability, suggesting right atrial pressure of 3 mmHg. FINDINGS  Left Ventricle: Left ventricular ejection fraction, by estimation, is 60 to 65%. The left ventricle has normal function. The left ventricle has no regional wall motion abnormalities. The left ventricular internal cavity size was normal in size. There is  mild left ventricular hypertrophy. Left ventricular diastolic parameters are consistent with Grade  I diastolic dysfunction (impaired relaxation). Right Ventricle: The right ventricular size is normal.Right ventricular systolic function is normal. There is mildly elevated pulmonary artery systolic pressure. The tricuspid regurgitant velocity is 2.90 m/s, and with an assumed right atrial pressure of  3 mmHg, the estimated right ventricular systolic pressure is 95.2 mmHg. Left Atrium: Left atrial size was normal in size. Right Atrium: Right atrial size was normal in size. Pericardium: There is no evidence of pericardial effusion. Mitral Valve: The mitral valve is normal in structure. Normal mobility of the mitral valve leaflets. Trivial mitral valve regurgitation. No evidence of mitral valve stenosis. Tricuspid Valve: The tricuspid valve is normal in structure. Tricuspid valve regurgitation is mild . No evidence of tricuspid stenosis. Aortic Valve: The aortic valve is tricuspid. Aortic valve regurgitation is not visualized. No aortic stenosis is present. Pulmonic Valve: The pulmonic valve was normal in structure. Pulmonic valve regurgitation is trivial. No evidence of pulmonic stenosis. Aorta: The aortic root is normal in size and structure. Venous: The inferior vena cava is normal in size with greater than 50% respiratory variability, suggesting right atrial pressure of 3 mmHg. IAS/Shunts: No atrial level shunt detected by color flow Doppler. Additional Comments: Normal LV systolic function; grade 1 diastolic dysfunction; mild LVH.  LEFT VENTRICLE PLAX 2D LVIDd:         4.56 cm LVIDs:         3.01 cm LV PW:         1.22 cm LV IVS:        1.42 cm LVOT diam:     2.00 cm LV SV:  79 LV SV Index:   36 LVOT Area:     3.14 cm  RIGHT VENTRICLE          IVC RV Basal diam:  2.18 cm  IVC diam: 1.52 cm TAPSE (M-mode): 2.1 cm LEFT ATRIUM             Index       RIGHT ATRIUM          Index LA diam:        4.20 cm 1.94 cm/m  RA Area:     8.59 cm LA Vol (A2C):   51.9 ml 23.91 ml/m RA Volume:   14.80 ml 6.82 ml/m LA Vol  (A4C):   44.0 ml 20.27 ml/m LA Biplane Vol: 49.1 ml 22.62 ml/m  AORTIC VALVE LVOT Vmax:   128.60 cm/s LVOT Vmean:  72.060 cm/s LVOT VTI:    0.251 m  AORTA Ao Root diam: 3.10 cm Ao Asc diam:  3.80 cm TRICUSPID VALVE TR Peak grad:   33.6 mmHg TR Vmax:        290.00 cm/s  SHUNTS Systemic VTI:  0.25 m Systemic Diam: 2.00 cm Kirk Ruths MD Electronically signed by Kirk Ruths MD Signature Date/Time: 12/08/2019/1:14:56 PM    Final     Scheduled Meds: . amLODipine  5 mg Oral Daily  . ascorbic acid  500 mg Oral BID  . atorvastatin  10 mg Oral Daily  . calcium carbonate  1 tablet Oral Daily  . Chlorhexidine Gluconate Cloth  6 each Topical Q0600  . cholecalciferol  1,000 Units Oral Daily  . divalproex  125 mg Oral QHS  . divalproex  500 mg Oral Daily  . insulin aspart  0-20 Units Subcutaneous TID WC  . insulin aspart  0-5 Units Subcutaneous QHS  . insulin glargine  10 Units Subcutaneous Daily  . lisinopril  10 mg Oral Daily  . metoprolol tartrate  5 mg Intravenous Q8H  . mupirocin ointment  1 application Nasal BID  . senna-docusate  1 tablet Oral Daily  . sodium chloride flush  10-40 mL Intracatheter Q12H    Continuous Infusions: . ceFEPime (MAXIPIME) IV 2 g (12/09/19 0535)  . metronidazole 500 mg (12/09/19 0618)  . vancomycin 1,500 mg (12/08/19 0924)     Time spent: 35 mins I have personally reviewed and interpreted on  12/09/2019 daily labs, tele strips, imagings as discussed above under date review session and assessment and plans.  I reviewed all nursing notes, pharmacy notes, consultant notes,  vitals, pertinent old records  I have discussed plan of care as described above with RN , patient  on 12/09/2019   Florencia Reasons MD, PhD, FACP  [facility]  Available via Epic secure chat 7am-7pm for nonurgent issues Please page for urgent issues, pager number available through [email or web address] .   12/09/2019, 8:15 AM  LOS: 6 days

## 2019-12-09 NOTE — Consult Note (Signed)
[facility]    Date of Admission:  12/03/2019      Total days of antibiotics 7  Day 7 cefepime, vanc, metronidazole                 Reason for Consult: Coccyx osteo / large sacral wound     Referring Provider: Roda Hendricks Primary Care Provider: Roderic Scarce, MD     Assessment: Becky Hendricks is a 74 y.o. 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 She has undergone 1 debridement now with general surgery and planning another take back to clean it up. Polymicrobial with a fairly unfriendly Staphylococcus warneri that will require IV vancomycin to treat. Other anaerobes and strepcococcus strains are well covered by Unasyn.  Will follow for intraoperative findings but with exposed bone planning on 6 weeks minimum of treatment. She will require exchange of her midline to a PICC given duration of Vancomycin she will need. She has never recovered pseudomonas from any wound samplings in the outpatient or inpatient setting.   She has a history in her chart of CDiff infection but no details. It does not appear she is currently having trouble with diarrhea but may need to consider prophylactic PO vancomycin for her (previously on flagyl as part of her regimen. Could alternatively consider ceftriaxone + PO flagyl but not sure it will offer much of a preventative benefit with regards to her CDiff history.   Follow SCr closely while on vancomycin.   Viridans streptococcus in the blood 1/4 bottles - seems to be that this is more consistent with contaminant from phlebotomy not true infection. Would not work up further.      Plan: 1. Antimicrobials: IV Vancomycin + Unasyn 2. Follow surgery findings from next intervention / wound assessment  3. Will need midline exchanged for PICC prior to D/C  4. Optimize nutrition therapy - recommend dietician consult if not already done  5. Will need offloading therapy with  minimal time spent flat on wound bed.      Principal Problem:   Osteomyelitis (HCC) Active Problems:   Acute metabolic encephalopathy   Severe sepsis (HCC)   Abscess of multiple sites of buttock   AF (paroxysmal atrial fibrillation) (HCC)   . amLODipine  5 mg Oral Daily  . ascorbic acid  500 mg Oral BID  . atorvastatin  10 mg Oral Daily  . calcium carbonate  1 tablet Oral Daily  . carvedilol  25 mg Oral BID WC  . Chlorhexidine Gluconate Cloth  6 each Topical Q0600  . cholecalciferol  1,000 Units Oral Daily  . divalproex  125 mg Oral QHS  . divalproex  500 mg Oral Daily  . guaiFENesin  600 mg Oral BID  . insulin aspart  0-20 Units Subcutaneous TID WC  . insulin aspart  0-5 Units Subcutaneous QHS  . insulin glargine  10 Units Subcutaneous Daily  . lisinopril  10 mg Oral Daily  . mupirocin ointment  1 application Nasal BID  . senna-docusate  1 tablet Oral Daily  . sodium chloride flush  10-40 mL Intracatheter Q12H    HPI: Becky Hendricks is a 74 y.o. female admitted from [facility] on 12/03/19 to evaluate altered mental status and hypotension.   She has a pmhx significant for AFib (on Eliquis), bipolar d/o, HTN, Obesity, CDiff infection history (unclear with details), UTI, IDDM2,. She recently had severe COVID-19 respiratory  failure in January 2021 requiring hospitalization and home oxygen following this infection. 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 She has had bone and tissue biopsies done outpatient with various strains of coagulase negative staphylococcus and streptococcus species and anaerobic bacteria.  With her current admission she was found to be febrile and tachycardic with leukocytosis. Wound was grossly infected and taken to surgery with general surgery team for debridement on 4/23 with plans to take her back to re-examine  the wound. Cultures from intraoperative tissues grew out Becky Hendricks, Becky Hendricks (also on outside culture info), Prevotella and bacteroides spcs.   Becky LoanJanina tells us she resided in a SNF prior to COVID and was not able to take care of herself for reasons that are unclear. She cites her brother is her point of contact and her "life saver" who lives locally to facilitate her care. She states she has a good appetite now and was ambulatory prior to COVID.  She has not been since.  Much of the history was taken from the chart as she was not able to provide much full detail.    Review of Systems: Review of Systems  Unable to perform ROS: Mental status change    Past Medical History:  Diagnosis Date  . Atrial fibrillation (HCC)   . Bipolar disorder (HCC)   . Clostridium difficile diarrhea 08/02/2019  . Dehydration   . Essential hypertension   . Morbid obesity (HCC)   . Type 2 diabetes mellitus (HCC)   . Weakness     Social History   Tobacco Use  . Smoking status: Never Smoker  . Smokeless tobacco: Never Used  Substance Use Topics  . Alcohol use: Not Currently  . Drug use: Not Currently    Family History  Family history unknown: Yes   Not on File  OBJECTIVE: Blood pressure (!) 169/99, pulse 96, temperature 99.4 F (37.4 C), temperature source Axillary, resp. rate (!) 31, height 5\' 6"  (1.676 m), weight 110 kg, SpO2 95 %.  Physical Exam Vitals reviewed.  Constitutional:      Appearance: Normal appearance.     Comments: Resting comfortably in bed.   HENT:     Mouth/Throat:     Mouth: Mucous membranes are moist.     Pharynx: Oropharynx is clear.  Eyes:     General: No scleral icterus.    Pupils: Pupils are equal, round, and reactive to light.  Cardiovascular:     Rate and Rhythm: Normal rate and regular rhythm.  Pulmonary:     Effort: Pulmonary effort is normal.     Breath sounds: Normal breath sounds.  Abdominal:     General: Bowel sounds are normal. There is no  distension.     Palpations: Abdomen is soft.  Musculoskeletal:     Comments: Wiggles toes. Cannot lift to gravity with offloading boots on.  Wound not directly visualized today - photos reviewed.   Skin:    General: Skin is warm and dry.     Capillary Refill: Capillary refill takes less than 2 seconds.     Comments: L midline catheter in place and unremarkable.   Neurological:     Mental Status: She is alert.     Comments: Oriented to person, situation and place.      Lab Results Lab Results  Component Value Date   WBC 11.1 (H)  12/09/2019   HGB 9.6 (L) 12/09/2019   HCT 32.2 (L) 12/09/2019   MCV 93.1 12/09/2019   PLT 358 12/09/2019    Lab Results  Component Value Date   CREATININE 0.37 (L) 12/09/2019   BUN 8 12/09/2019   NA 142 12/09/2019   K 4.2 12/09/2019   CL 97 (L) 12/09/2019   CO2 38 (H) 12/09/2019    Lab Results  Component Value Date   ALT 18 12/08/2019   AST 16 12/08/2019   ALKPHOS 85 12/08/2019   BILITOT 0.3 12/08/2019     Microbiology: Recent Results (from the past 240 hour(s))  Blood Culture (routine x 2)     Status: Abnormal   Collection Time: 12/03/19  1:15 PM   Specimen: BLOOD  Result Value Ref Range Status   Specimen Description BLOOD RIGHT ANTECUBITAL  Final   Special Requests   Final    BOTTLES DRAWN AEROBIC AND ANAEROBIC Blood Culture adequate volume   Culture  Setup Time   Final    GRAM POSITIVE COCCI IN CHAINS ANAEROBIC BOTTLE ONLY CRITICAL RESULT CALLED TO, READ BACK BY AND VERIFIED WITH: Gala Lewandowsky PharmD 10:15 12/04/19 ([email or web address])    Culture (A)  Final    VIRIDANS STREPTOCOCCUS THE SIGNIFICANCE OF ISOLATING THIS ORGANISM FROM A SINGLE SET OF BLOOD CULTURES WHEN MULTIPLE SETS ARE DRAWN IS UNCERTAIN. PLEASE NOTIFY THE MICROBIOLOGY DEPARTMENT WITHIN ONE WEEK IF SPECIATION AND SENSITIVITIES ARE REQUIRED. Performed at [facility], [address] [place], [place] [address]    Report Status 12/06/2019 FINAL  Final  Blood Culture ID  Panel (Reflexed)     Status: Abnormal   Collection Time: 12/03/19  1:15 PM  Result Value Ref Range Status   Enterococcus species NOT DETECTED NOT DETECTED Final   Listeria monocytogenes NOT DETECTED NOT DETECTED Final   Staphylococcus species NOT DETECTED NOT DETECTED Final   Staphylococcus aureus (BCID) NOT DETECTED NOT DETECTED Final   Streptococcus species DETECTED (A) NOT DETECTED Final    Comment: Not Enterococcus species, Streptococcus agalactiae, Streptococcus pyogenes, or Streptococcus pneumoniae. CRITICAL RESULT CALLED TO, READ BACK BY AND VERIFIED WITH: Gala Lewandowsky PharmD 10:15 12/04/19 ([email or web address])    Streptococcus agalactiae NOT DETECTED NOT DETECTED Final   Streptococcus pneumoniae NOT DETECTED NOT DETECTED Final   Streptococcus pyogenes NOT DETECTED NOT DETECTED Final   Acinetobacter baumannii NOT DETECTED NOT DETECTED Final   Enterobacteriaceae species NOT DETECTED NOT DETECTED Final   Enterobacter cloacae complex NOT DETECTED NOT DETECTED Final   Escherichia coli NOT DETECTED NOT DETECTED Final   Klebsiella oxytoca NOT DETECTED NOT DETECTED Final   Klebsiella pneumoniae NOT DETECTED NOT DETECTED Final   Proteus species NOT DETECTED NOT DETECTED Final   Serratia marcescens NOT DETECTED NOT DETECTED Final   Haemophilus influenzae NOT DETECTED NOT DETECTED Final   Neisseria meningitidis NOT DETECTED NOT DETECTED Final   Pseudomonas aeruginosa NOT DETECTED NOT DETECTED Final   Candida albicans NOT DETECTED NOT DETECTED Final   Candida glabrata NOT DETECTED NOT DETECTED Final   Candida krusei NOT DETECTED NOT DETECTED Final   Candida parapsilosis NOT DETECTED NOT DETECTED Final   Candida tropicalis NOT DETECTED NOT DETECTED Final    Comment: Performed at [facility], [address] [place], [place] [address]  Blood Culture (routine x 2)     Status: None   Collection Time: 12/03/19  1:33 PM   Specimen: BLOOD LEFT WRIST  Result Value Ref Range Status   Specimen  Description BLOOD LEFT  WRIST  Final   Special Requests   Final    BOTTLES DRAWN AEROBIC ONLY Blood Culture results may not be optimal due to an inadequate volume of blood received in culture bottles   Culture   Final    NO GROWTH 5 DAYS Performed at [facility] [place] [facility], [address] [place], [place] [address]    Report Status 12/08/2019 FINAL  Final  Urine culture     Status: None   Collection Time: 12/03/19  5:20 PM   Specimen: In/Out Cath Urine  Result Value Ref Range Status   Specimen Description IN/OUT CATH URINE  Final   Special Requests NONE  Final   Culture   Final    NO GROWTH Performed at [facility], [address] [place], [place] [address]    Report Status 12/05/2019 FINAL  Final  Respiratory Panel by RT PCR (Flu A&B, Covid) - Nasopharyngeal Swab     Status: None   Collection Time: 12/03/19 11:17 PM   Specimen: Nasopharyngeal Swab  Result Value Ref Range Status   SARS Coronavirus 2 by RT PCR NEGATIVE NEGATIVE Final    Comment: (NOTE) SARS-CoV-2 target nucleic acids are NOT DETECTED. The SARS-CoV-2 RNA is generally detectable in upper respiratoy specimens during the acute phase of infection. The lowest concentration of SARS-CoV-2 viral copies this assay can detect is 131 copies/mL. A negative result does not preclude SARS-Cov-2 infection and should not be used as the sole basis for treatment or other patient management decisions. A negative result may occur with  improper specimen collection/handling, submission of specimen other than nasopharyngeal swab, presence of viral mutation(s) within the areas targeted by this assay, and inadequate number of viral copies (<131 copies/mL). A negative result must be combined with clinical observations, patient history, and epidemiological information. The expected result is Negative. Fact Sheet for Patients:  https://www.moore.com/ Fact Sheet for Healthcare Providers:    https://www.young.biz/ This test is not yet ap proved or cleared by the [place] FDA and  has been authorized for detection and/or diagnosis of SARS-CoV-2 by FDA under an Emergency Use Authorization (EUA). This EUA will remain  in effect (meaning this test can be used) for the duration of the COVID-19 declaration under Section 564(b)(1) of the Act, 21 U.S.C. section 360bbb-3(b)(1), unless the authorization is terminated or revoked sooner.    Influenza A by PCR NEGATIVE NEGATIVE Final   Influenza B by PCR NEGATIVE NEGATIVE Final    Comment: (NOTE) The Xpert Xpress SARS-CoV-2/FLU/RSV assay is intended as an aid in  the diagnosis of influenza from Nasopharyngeal swab specimens and  should not be used as a sole basis for treatment. Nasal washings and  aspirates are unacceptable for Xpert Xpress SARS-CoV-2/FLU/RSV  testing. Fact Sheet for Patients: https://www.moore.com/ Fact Sheet for Healthcare Providers: https://www.young.biz/ This test is not yet approved or cleared by the [place] FDA and  has been authorized for detection and/or diagnosis of SARS-CoV-2 by  FDA under an Emergency Use Authorization (EUA). This EUA will remain  in effect (meaning this test can be used) for the duration of the  Covid-19 declaration under Section 564(b)(1) of the Act, 21  U.S.C. section 360bbb-3(b)(1), unless the authorization is  terminated or revoked. Performed at [facility], [address], [place], [place] [address]   MRSA PCR Screening     Status: Abnormal   Collection Time: 12/04/19  6:13 AM   Specimen: Nasal Mucosa; Nasopharyngeal  Result Value Ref Range Status  MRSA by PCR POSITIVE (A) NEGATIVE Final    Comment:        The GeneXpert MRSA Assay (FDA approved for NASAL specimens only), is one component of a comprehensive MRSA colonization surveillance program. It is not intended to diagnose MRSA infection nor to  guide or monitor treatment for MRSA infections. RESULT CALLED TO, READ BACK BY AND VERIFIED WITH: Diannia Ruder RN 8:40 12/04/19 ([email or web address]) Performed at [facility], [place] [address], [place], [place] [address]   Aerobic/Anaerobic Culture (surgical/deep wound)     Status: None   Collection Time: 12/04/19 10:21 AM   Specimen: PATH Other; Tissue  Result Value Ref Range Status   Specimen Description TISSUE  Final   Special Requests LEFT BUTTOCK ABSCESS SPEC A  Final   Gram Stain   Final    RARE WBC PRESENT,BOTH PMN AND MONONUCLEAR NO ORGANISMS SEEN    Culture   Final    RARE STAPHYLOCOCCUS WARNERI RARE STREPTOCOCCUS Hendricks CRITICAL RESULT CALLED TO, READ BACK BY AND VERIFIED WITH: RN M LESSARD [id number] AT 1025 AM BY CM FEW PREVOTELLA DENTICOLA FEW BACTEROIDES THETAIOTAOMICRON ORG 4 AND 5 BETA LACTAMASE POSITIVE Performed at [facility], [place] [address] [place],  [address]    Report Status 12/09/2019 FINAL  Final   Organism ID, Bacteria STAPHYLOCOCCUS WARNERI  Final   Organism ID, Bacteria STREPTOCOCCUS Hendricks  Final      Susceptibility   Staphylococcus warneri - MIC*    CIPROFLOXACIN >=8 RESISTANT Resistant     ERYTHROMYCIN RESISTANT Resistant     GENTAMICIN <=0.5 SENSITIVE Sensitive     OXACILLIN >=4 RESISTANT Resistant     TETRACYCLINE >=16 RESISTANT Resistant     VANCOMYCIN <=0.5 SENSITIVE Sensitive     TRIMETH/SULFA <=10 SENSITIVE Sensitive     CLINDAMYCIN RESISTANT Resistant     RIFAMPIN <=0.5 SENSITIVE Sensitive     Inducible Clindamycin POSITIVE Resistant     * RARE STAPHYLOCOCCUS WARNERI   Streptococcus Hendricks - MIC*    PENICILLIN <=0.06 SENSITIVE Sensitive     CEFTRIAXONE 0.25 SENSITIVE Sensitive     ERYTHROMYCIN <=0.12 SENSITIVE Sensitive     LEVOFLOXACIN 0.5 SENSITIVE Sensitive     VANCOMYCIN 0.5 SENSITIVE Sensitive     * RARE STREPTOCOCCUS Hendricks    Janene Madeira, MSN, NP-C [facility] [email or web address] Pager: [phone] Office: [phone] [place]: [phone]

## 2019-12-09 NOTE — Progress Notes (Signed)
Notified on call provider of following VS:    12/09/19 0042  Vitals  Temp 98.1 F (36.7 C)  Temp Source Oral  BP (!) 192/94  MAP (mmHg) 125  ECG Heart Rate 96  Resp (!) 33  Oxygen Therapy  SpO2 97 %  O2 Device Nasal Cannula  O2 Flow Rate (L/min) 2 L/min

## 2019-12-09 NOTE — Progress Notes (Signed)
5 Days Post-Op  Subjective: CC: No complaints. Dressing change this AM   Objective: Vital signs in last 24 hours: Temp:  [98 F (36.7 C)-99.9 F (37.7 C)] 99.7 F (37.6 C) (04/28 0726) Resp:  [23-41] 28 (04/28 0726) BP: (126-192)/(67-137) 175/82 (04/28 0726) SpO2:  [91 %-98 %] 94 % (04/28 0726) Last BM Date: 12/08/19  Intake/Output from previous day: 04/27 0701 - 04/28 0700 In: 1220 [P.O.:920; IV Piggyback:300] Out: 1700 [Urine:1700] Intake/Output this shift: No intake/output data recorded.  PE: BLT:JQZES, NAD Pulm:On o2, normal rate and effort Abd: Soft, NT/ND Psych: A&Ox1 Skin: no rashes noted, warm and dry Wound: No change from pictures on 4/26. Chaperone present, RN x 2. Packing removed from two left buttock incisions measuring 5cm in length. Dressing clean on superior incision. Superior surgical wound with healthy appearing tissue at the base. Inferior of two surgical incisions with purulence on dressing with a small amount of purulence draining after dressing was removed. No loculations. No surrounding erythema, heat, induration or fluctuance of either surgical wounds Sacral wound measuring 6cm x 4cm. Dressing saturated with purulent material. Base of wound with healthy appearing granulation tissue. Sacrum is palpable. There is active purulent dressing from the wound that is expressed from the wound when pressure is applied 2-3cm above the wound at the 9 o'clock position (towards left buttock). There is no surrounding skin changes, induration, obvious fluctuance. No wound tracking with probing of a sterile cotton swab.   Lab Results:  Recent Labs    12/08/19 0355 12/09/19 0500  WBC 10.7* 11.1*  HGB 10.0* 9.6*  HCT 33.4* 32.2*  PLT 360 358   BMET Recent Labs    12/08/19 0355 12/09/19 0500  NA 138 142  K 2.9* 4.2  CL 91* 97*  CO2 38* 38*  GLUCOSE 199* 173*  BUN 11 8  CREATININE 0.54 0.37*  CALCIUM 8.2* 8.2*   PT/INR No results for input(s):  LABPROT, INR in the last 72 hours. CMP     Component Value Date/Time   NA 142 12/09/2019 0500   K 4.2 12/09/2019 0500   CL 97 (L) 12/09/2019 0500   CO2 38 (H) 12/09/2019 0500   GLUCOSE 173 (H) 12/09/2019 0500   BUN 8 12/09/2019 0500   CREATININE 0.37 (L) 12/09/2019 0500   CALCIUM 8.2 (L) 12/09/2019 0500   PROT 5.6 (L) 12/08/2019 0355   ALBUMIN 1.5 (L) 12/08/2019 0355   AST 16 12/08/2019 0355   ALT 18 12/08/2019 0355   ALKPHOS 85 12/08/2019 0355   BILITOT 0.3 12/08/2019 0355   GFRNONAA >60 12/09/2019 0500   GFRAA >60 12/09/2019 0500   Lipase  No results found for: LIPASE     Studies/Results: ECHOCARDIOGRAM COMPLETE  Result Date: 12/08/2019    ECHOCARDIOGRAM REPORT   Patient Name:   Becky Hendricks Date of Exam: 12/08/2019 Medical Rec #:  [id number]      Height:       66.0 in Accession #:    [id number]     Weight:       242.5 lb Date of Birth:  02/05/1946       BSA:          2.170 m Patient Age:    73 years       BP:           150/120 mmHg Patient Gender: F              HR:  96 bpm. Exam Location:  Inpatient Procedure: 2D Echo, Cardiac Doppler and Color Doppler Indications:    Endocarditis I38  History:        Patient has no prior history of Echocardiogram examinations.                 Arrythmias:Atrial Fibrillation; Risk Factors:Hypertension and                 Diabetes. Sepsis. Oseomyelitis.  Sonographer:    Clayton Lefort RDCS (AE) Referring Phys: [id number] JEFFREY [facility]  Sonographer Comments: Limited patient mobility. IMPRESSIONS  1. Normal LV systolic function; grade 1 diastolic dysfunction; mild LVH.  2. Left ventricular ejection fraction, by estimation, is 60 to 65%. The left ventricle has normal function. The left ventricle has no regional wall motion abnormalities. There is mild left ventricular hypertrophy. Left ventricular diastolic parameters are consistent with Grade I diastolic dysfunction (impaired relaxation).  3. Right ventricular systolic function is normal. The right  ventricular size is normal. There is mildly elevated pulmonary artery systolic pressure.  4. The mitral valve is normal in structure. Trivial mitral valve regurgitation. No evidence of mitral stenosis.  5. The aortic valve is tricuspid. Aortic valve regurgitation is not visualized. No aortic stenosis is present.  6. The inferior vena cava is normal in size with greater than 50% respiratory variability, suggesting right atrial pressure of 3 mmHg. FINDINGS  Left Ventricle: Left ventricular ejection fraction, by estimation, is 60 to 65%. The left ventricle has normal function. The left ventricle has no regional wall motion abnormalities. The left ventricular internal cavity size was normal in size. There is  mild left ventricular hypertrophy. Left ventricular diastolic parameters are consistent with Grade I diastolic dysfunction (impaired relaxation). Right Ventricle: The right ventricular size is normal.Right ventricular systolic function is normal. There is mildly elevated pulmonary artery systolic pressure. The tricuspid regurgitant velocity is 2.90 m/s, and with an assumed right atrial pressure of  3 mmHg, the estimated right ventricular systolic pressure is 40.3 mmHg. Left Atrium: Left atrial size was normal in size. Right Atrium: Right atrial size was normal in size. Pericardium: There is no evidence of pericardial effusion. Mitral Valve: The mitral valve is normal in structure. Normal mobility of the mitral valve leaflets. Trivial mitral valve regurgitation. No evidence of mitral valve stenosis. Tricuspid Valve: The tricuspid valve is normal in structure. Tricuspid valve regurgitation is mild . No evidence of tricuspid stenosis. Aortic Valve: The aortic valve is tricuspid. Aortic valve regurgitation is not visualized. No aortic stenosis is present. Pulmonic Valve: The pulmonic valve was normal in structure. Pulmonic valve regurgitation is trivial. No evidence of pulmonic stenosis. Aorta: The aortic root is  normal in size and structure. Venous: The inferior vena cava is normal in size with greater than 50% respiratory variability, suggesting right atrial pressure of 3 mmHg. IAS/Shunts: No atrial level shunt detected by color flow Doppler. Additional Comments: Normal LV systolic function; grade 1 diastolic dysfunction; mild LVH.  LEFT VENTRICLE PLAX 2D LVIDd:         4.56 cm LVIDs:         3.01 cm LV PW:         1.22 cm LV IVS:        1.42 cm LVOT diam:     2.00 cm LV SV:         79 LV SV Index:   36 LVOT Area:     3.14 cm  RIGHT VENTRICLE  IVC RV Basal diam:  2.18 cm  IVC diam: 1.52 cm TAPSE (M-mode): 2.1 cm LEFT ATRIUM             Index       RIGHT ATRIUM          Index LA diam:        4.20 cm 1.94 cm/m  RA Area:     8.59 cm LA Vol (A2C):   51.9 ml 23.91 ml/m RA Volume:   14.80 ml 6.82 ml/m LA Vol (A4C):   44.0 ml 20.27 ml/m LA Biplane Vol: 49.1 ml 22.62 ml/m  AORTIC VALVE LVOT Vmax:   128.60 cm/s LVOT Vmean:  72.060 cm/s LVOT VTI:    0.251 m  AORTA Ao Root diam: 3.10 cm Ao Asc diam:  3.80 cm TRICUSPID VALVE TR Peak grad:   33.6 mmHg TR Vmax:        290.00 cm/s  SHUNTS Systemic VTI:  0.25 m Systemic Diam: 2.00 cm Olga Millers MD Electronically signed by Olga Millers MD Signature Date/Time: 12/08/2019/1:14:56 PM    Final     Anti-infectives: Anti-infectives (From admission, onward)   Start     Dose/Rate Route Frequency Ordered Stop   12/05/19 1500  metroNIDAZOLE (FLAGYL) IVPB 500 mg     500 mg 100 mL/hr over 60 Minutes Intravenous Every 8 hours 12/05/19 1342     12/04/19 1430  vancomycin (VANCOREADY) IVPB 1500 mg/300 mL     1,500 mg 150 mL/hr over 120 Minutes Intravenous Every 24 hours 12/03/19 1435     12/03/19 2130  ceFEPIme (MAXIPIME) 2 g in sodium chloride 0.9 % 100 mL IVPB     2 g 200 mL/hr over 30 Minutes Intravenous Every 8 hours 12/03/19 1435     12/03/19 1330  ceFEPIme (MAXIPIME) 2 g in sodium chloride 0.9 % 100 mL IVPB     2 g 200 mL/hr over 30 Minutes Intravenous  Once  12/03/19 1307 12/03/19 1559   12/03/19 1315  metroNIDAZOLE (FLAGYL) IVPB 500 mg     500 mg 100 mL/hr over 60 Minutes Intravenous  Once 12/03/19 1307 12/03/19 1559   12/03/19 1315  vancomycin (VANCOCIN) IVPB 1000 mg/200 mL premix  Status:  Discontinued     1,000 mg 200 mL/hr over 60 Minutes Intravenous  Once 12/03/19 1307 12/03/19 1311   12/03/19 1315  vancomycin (VANCOCIN) 2,500 mg in sodium chloride 0.9 % 500 mL IVPB     2,500 mg 250 mL/hr over 120 Minutes Intravenous  Once 12/03/19 1311 12/03/19 1658       Assessment/Plan Atrial fibrillation on Eliquis Baseline confusion/bipolar disorder Type 2 diabetes Chronic respiratory failure on home O2 Covid infection January 2021 Possible HCAP  Sepsis Bacteremia- strep viridans Left buttocks abscess Stage IV pressure ulcers buttocks S/pI&D of left buttocks abscess 12/04/2019 Dr. Violeta Gelinas - POD #5 - Wound is actively draining purulent material out of sacral wound. Cont BID dressing changes. Await Eliquis to wear off. Plan for OR tomorrow  - Air mattress, frequent turns - PT  - Cont abx. Cx's with rare staph warneri and strep anginosis. TRH has consulted ID    FEN: Carb modified diet. NPO at midnight  ID: Flagyl x1 dose 4/22; cefepime/vancomycin 4/22>>Flagyl 4/24 >> WBC 10.7 DVT: SCDs;Eliquisheld 4/26 Follow-up: Wound care   LOS: 6 days    Jacinto Halim , [facility] Surgery 12/09/2019, 10:16 AM Please see Amion for pager number during day hours 7:00am-4:30pm

## 2019-12-09 NOTE — Progress Notes (Signed)
Pharmacy Antibiotic Note  Becky Hendricks is a 74 y.o. female admitted on 12/03/2019 with sepsis.  Patient has purulent sacral wound and was taking CTX for 1 week prior. Has hx of recurrent UTI. Pharmacy has been consulted for Vancomycin dosing. Cefepime and flagyl transitioned to Unasyn today. S/p I&D of the sacral wound with repeat planned 4/29. Renal function remains stable. We will use vanc trough for monitoring - therapeutic at 15 (goal 15-20) on 4/25.   Plan: Continue vancomycin 1500mg  IV Q24 hrs (expected AUC 500, SCr used 0.8) Unasyn 3g IV q6h Monitor clinical progress, c/s, renal function F/u de-escalation plan/LOT, vancomycin levels as indicated   Height: 5\' 6"  (167.6 cm) Weight: 110 kg (242 lb 8.1 oz) IBW/kg (Calculated) : 59.3  Temp (24hrs), Avg:98.6 F (37 C), Min:98 F (36.7 C), Max:99.9 F (37.7 C)  Recent Labs  Lab 12/03/19 1332 12/03/19 1332 12/03/19 1655 12/04/19 0326 12/04/19 0326 12/05/19 0519 12/06/19 0344 12/06/19 0909 12/07/19 0510 12/08/19 0355 12/09/19 0500  WBC 12.6*   < >  --  12.9*   < > 10.3 10.1  --  13.0* 10.7* 11.1*  CREATININE 0.47   < >  --  0.41*  --  0.41* 0.35*  --   --  0.54 0.37*  LATICACIDVEN 1.5  --  1.5  --   --   --   --   --   --   --   --   VANCOTROUGH  --   --   --   --   --   --   --  15  --   --   --    < > = values in this interval not displayed.    Estimated Creatinine Clearance: 81.1 mL/min (A) (by C-G formula based on SCr of 0.37 mg/dL (L)).    Not on File  Antimicrobials this admission: Vanc 4/22 >> Flagyl 4/22 x1; 4/24 >>4/28 Cefepime 4/22 >>4/28 Unasyn 4/28 >>  Dose adjustments this admission: 4/25 VT>>15 - continue  Microbiology results: 4/22 BC: 1/3 viridan strep 4/22 UCx >>neg 4/23 L-buttock abscess: rare staphylococcus warneri, strep anginosis 4/23 MRSA pcr +   5/23, PharmD, BCPS Please check AMION for all [facility] contact numbers Clinical Pharmacist 12/09/2019 11:25 AM

## 2019-12-10 ENCOUNTER — Inpatient Hospital Stay (HOSPITAL_COMMUNITY): Payer: Medicare Other | Admitting: Certified Registered Nurse Anesthetist

## 2019-12-10 ENCOUNTER — Encounter (HOSPITAL_COMMUNITY): Payer: Self-pay | Admitting: Internal Medicine

## 2019-12-10 ENCOUNTER — Encounter (HOSPITAL_COMMUNITY)
Admission: EM | Disposition: A | Discharge status: Skilled Nursing Facility | Payer: Self-pay | Source: Skilled Nursing Facility | Attending: Internal Medicine

## 2019-12-10 ENCOUNTER — Other Ambulatory Visit: Payer: Self-pay

## 2019-12-10 HISTORY — PX: INCISION AND DRAINAGE PERIRECTAL ABSCESS: SHX1804

## 2019-12-10 LAB — MAGNESIUM: Magnesium: 1.7 mg/dL (ref 1.7–2.4)

## 2019-12-10 LAB — GLUCOSE, CAPILLARY
Glucose-Capillary: 141 mg/dL — ABNORMAL HIGH (ref 70–99)
Glucose-Capillary: 142 mg/dL — ABNORMAL HIGH (ref 70–99)
Glucose-Capillary: 156 mg/dL — ABNORMAL HIGH (ref 70–99)
Glucose-Capillary: 196 mg/dL — ABNORMAL HIGH (ref 70–99)

## 2019-12-10 LAB — BASIC METABOLIC PANEL
Anion gap: 8 (ref 5–15)
BUN: 9 mg/dL (ref 8–23)
CO2: 38 mmol/L — ABNORMAL HIGH (ref 22–32)
Calcium: 8.1 mg/dL — ABNORMAL LOW (ref 8.9–10.3)
Chloride: 93 mmol/L — ABNORMAL LOW (ref 98–111)
Creatinine, Ser: 0.38 mg/dL — ABNORMAL LOW (ref 0.44–1.00)
GFR calc Af Amer: >60 mL/min (ref >60)
GFR calc non Af Amer: >60 mL/min (ref >60)
Glucose, Bld: 162 mg/dL — ABNORMAL HIGH (ref 70–99)
Potassium: 4.4 mmol/L (ref 3.5–5.1)
Sodium: 139 mmol/L (ref 135–145)

## 2019-12-10 SURGERY — INCISION AND DRAINAGE, ABSCESS, PERIRECTAL
Anesthesia: General | Laterality: Left

## 2019-12-10 MED ORDER — FENTANYL CITRATE (PF) 250 MCG/5ML IJ SOLN
INTRAMUSCULAR | Status: AC
  Filled 2019-12-10: qty 5

## 2019-12-10 MED ORDER — PHENYLEPHRINE 40 MCG/ML (10ML) SYRINGE FOR IV PUSH (FOR BLOOD PRESSURE SUPPORT)
PREFILLED_SYRINGE | INTRAVENOUS | Status: DC | PRN
  Administered 2019-12-10: 120 ug via INTRAVENOUS
  Administered 2019-12-10: 80 ug via INTRAVENOUS
  Administered 2019-12-10: 200 ug via INTRAVENOUS

## 2019-12-10 MED ORDER — PHENYLEPHRINE HCL-NACL 10-0.9 MG/250ML-% IV SOLN
INTRAVENOUS | Status: DC | PRN
  Administered 2019-12-10: 40 ug/min via INTRAVENOUS

## 2019-12-10 MED ORDER — LIDOCAINE 2% (20 MG/ML) 5 ML SYRINGE
INTRAMUSCULAR | Status: DC | PRN
  Administered 2019-12-10: 100 mg via INTRAVENOUS

## 2019-12-10 MED ORDER — ALBUMIN HUMAN 5 % IV SOLN: INTRAVENOUS | Status: DC | PRN

## 2019-12-10 MED ORDER — HYDROMORPHONE HCL 1 MG/ML IJ SOLN: 0.2500 mg | INTRAMUSCULAR | Status: DC | PRN

## 2019-12-10 MED ORDER — SODIUM CHLORIDE 0.9 % IV SOLN: INTRAVENOUS | Status: DC

## 2019-12-10 MED ORDER — EPHEDRINE SULFATE-NACL 50-0.9 MG/10ML-% IV SOSY
PREFILLED_SYRINGE | INTRAVENOUS | Status: DC | PRN
  Administered 2019-12-10 (×2): 10 mg via INTRAVENOUS
  Administered 2019-12-10: 15 mg via INTRAVENOUS

## 2019-12-10 MED ORDER — SUCCINYLCHOLINE CHLORIDE 200 MG/10ML IV SOSY
PREFILLED_SYRINGE | INTRAVENOUS | Status: DC | PRN
  Administered 2019-12-10: 140 mg via INTRAVENOUS

## 2019-12-10 MED ORDER — ONDANSETRON HCL 4 MG/2ML IJ SOLN: 4.0000 mg | Freq: Once | INTRAMUSCULAR | Status: DC | PRN

## 2019-12-10 MED ORDER — AMLODIPINE BESYLATE 10 MG PO TABS: 10.0000 mg | ORAL_TABLET | Freq: Every day | ORAL | Status: DC

## 2019-12-10 MED ORDER — ONDANSETRON HCL 4 MG/2ML IJ SOLN
INTRAMUSCULAR | Status: DC | PRN
  Administered 2019-12-10: 4 mg via INTRAVENOUS

## 2019-12-10 MED ORDER — MEPERIDINE HCL 25 MG/ML IJ SOLN: 6.2500 mg | INTRAMUSCULAR | Status: DC | PRN

## 2019-12-10 MED ORDER — FENTANYL CITRATE (PF) 100 MCG/2ML IJ SOLN
INTRAMUSCULAR | Status: DC | PRN
  Administered 2019-12-10: 50 ug via INTRAVENOUS

## 2019-12-10 MED ORDER — LISINOPRIL 20 MG PO TABS: 20.0000 mg | ORAL_TABLET | Freq: Every day | ORAL | Status: DC

## 2019-12-10 MED ORDER — PROPOFOL 10 MG/ML IV BOLUS
INTRAVENOUS | Status: DC | PRN
  Administered 2019-12-10: 80 mg via INTRAVENOUS

## 2019-12-10 SURGICAL SUPPLY — 23 items
BNDG GAUZE ELAST 4 BULKY (GAUZE/BANDAGES/DRESSINGS) ×3 IMPLANT
CANISTER SUCT 3000ML PPV (MISCELLANEOUS) ×3 IMPLANT
DRSG PAD ABDOMINAL 8X10 ST (GAUZE/BANDAGES/DRESSINGS) IMPLANT
ELECT REM PT RETURN 9FT ADLT (ELECTROSURGICAL) ×3
ELECTRODE REM PT RTRN 9FT ADLT (ELECTROSURGICAL) ×1 IMPLANT
GAUZE PACKING IODOFORM 1X5 (PACKING) IMPLANT
GAUZE SPONGE 4X4 12PLY STRL (GAUZE/BANDAGES/DRESSINGS) IMPLANT
GLOVE SURG SIGNA 7.5 PF LTX (GLOVE) ×3 IMPLANT
GOWN STRL REUS W/ TWL LRG LVL3 (GOWN DISPOSABLE) ×1 IMPLANT
GOWN STRL REUS W/ TWL XL LVL3 (GOWN DISPOSABLE) ×1 IMPLANT
GOWN STRL REUS W/TWL LRG LVL3 (GOWN DISPOSABLE) ×2
GOWN STRL REUS W/TWL XL LVL3 (GOWN DISPOSABLE) ×2
KIT BASIN OR (CUSTOM PROCEDURE TRAY) ×3 IMPLANT
KIT TURNOVER KIT B (KITS) ×3 IMPLANT
NS IRRIG 1000ML POUR BTL (IV SOLUTION) ×3 IMPLANT
PACK GENERAL/GYN (CUSTOM PROCEDURE TRAY) ×3 IMPLANT
PENCIL SMOKE EVAC W/HOLSTER (ELECTROSURGICAL) ×3 IMPLANT
SPONGE LAP 18X18 RF (DISPOSABLE) IMPLANT
SWAB COLLECTION DEVICE MRSA (MISCELLANEOUS) IMPLANT
SWAB CULTURE ESWAB REG 1ML (MISCELLANEOUS) IMPLANT
TOWEL GREEN STERILE (TOWEL DISPOSABLE) ×3 IMPLANT
TOWEL GREEN STERILE FF (TOWEL DISPOSABLE) IMPLANT
UNDERPAD 30X30 (UNDERPADS AND DIAPERS) ×3 IMPLANT

## 2019-12-10 NOTE — Progress Notes (Signed)
Mews triggered yellow, patient just returned from OR, see 1408 for full vital signs and green mews

## 2019-12-10 NOTE — Anesthesia Procedure Notes (Signed)

## 2019-12-10 NOTE — Transfer of Care (Signed)
Immediate Anesthesia Transfer of Care Note  Patient: Becky Hendricks  Procedure(s) Performed: REPEAT IRRIGATION AND DEBRIDEMENT BUTTOCK  ABSCESS (Left )  Patient Location: PACU  Anesthesia Type:General  Level of Consciousness: awake and alert   Airway & Oxygen Therapy: Patient Spontanous Breathing and Patient connected to face mask oxygen  Post-op Assessment: Report given to RN, Post -op Vital signs reviewed and stable and Patient moving all extremities  Post vital signs: Reviewed and stable  Last Vitals:  Vitals Value Taken Time  BP 149/96 12/10/19 1319  Temp    Pulse 86 12/10/19 1323  Resp 24 12/10/19 1323  SpO2 95 % 12/10/19 1323  Vitals shown include unvalidated device data.  Last Pain:  Vitals:   12/10/19 1100  TempSrc: Oral  PainSc:       Patients Stated Pain Goal: 3 (12/10/19 1222)  Complications: No apparent anesthesia complications

## 2019-12-10 NOTE — Progress Notes (Signed)
PT Cancellation Note  Patient Details Name: Becky Hendricks MRN: [id number] DOB: 1945/09/11   Cancelled Treatment:    Reason Eval/Treat Not Completed: Patient at procedure or test/unavailable - at repeat I&D procedure, PT to check back as schedule allows.  Richrd Sox, PT Acute Rehabilitation Services Pager [phone]  Office [phone]    Tyrone Apple D Despina Hidden 12/10/2019, 1:47 PM

## 2019-12-10 NOTE — Anesthesia Preprocedure Evaluation (Signed)
Anesthesia Evaluation  Patient identified by MRN, date of birth, ID band Patient awake    Reviewed: Allergy & Precautions, NPO status , Patient's Chart, lab work & pertinent test results  Airway Mallampati: III  TM Distance: >3 FB Neck ROM: Full    Dental  (+) Dental Advisory Given   Pulmonary pneumonia, resolved,    Pulmonary exam normal breath sounds clear to auscultation       Cardiovascular hypertension, Pt. on medications and Pt. on home beta blockers Normal cardiovascular exam+ dysrhythmias Atrial Fibrillation  Rhythm:Regular Rate:Normal  ECG: ST, rate 98   Neuro/Psych PSYCHIATRIC DISORDERS Bipolar Disorder negative neurological ROS     GI/Hepatic negative GI ROS, Neg liver ROS,   Endo/Other  diabetes, Insulin DependentMorbid obesity  Renal/GU Renal disease     Musculoskeletal negative musculoskeletal ROS (+)   Abdominal (+) + obese,   Peds  Hematology  (+) anemia , HLD   Anesthesia Other Findings Left buttock abscess  Reproductive/Obstetrics                             Anesthesia Physical  Anesthesia Plan  ASA: III  Anesthesia Plan: General   Post-op Pain Management:    Induction: Intravenous  PONV Risk Score and Plan: 4 or greater and Ondansetron, Dexamethasone and Treatment may vary due to age or medical condition  Airway Management Planned: Oral ETT  Additional Equipment: None  Intra-op Plan:   Post-operative Plan: Extubation in OR  Informed Consent: I have reviewed the patients History and Physical, chart, labs and discussed the procedure including the risks, benefits and alternatives for the proposed anesthesia with the patient or authorized representative who has indicated his/her understanding and acceptance.     Dental advisory given  Plan Discussed with: CRNA  Anesthesia Plan Comments:         Anesthesia Quick Evaluation

## 2019-12-10 NOTE — Progress Notes (Signed)
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  Systolics were in the 29J at the facility.  She has a known chronic sacral wound for which she has been receiving Rocephin for 1 week.  Her urine was noted to be foul-smelling and she has a history of recurrent UTIs.  In the ED she was found to have a temperature of 100.2.  She was tachycardic and tachypneic up to 40 with systolics in the 24Q.  UA was not consistent with infection.  She was noted to have a very large deep sacral wound with purulent drainage.  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   Significant Events: 4/22 admit via [place] from [facility] 4/23 I&D left buttock abscess in OR  Antimicrobials:  Cefepime 4/22 > Flagyl 4/22 > Vancomycin 4/22 >    HPI/Recap of past 24 hours:  She is confused, not a reliable historian, she does follow commands She denies pain She is on 2 L of oxygen, no hypoxia documented at rest, no cough, but does appear tachypneic  Telemetry last 24 hours has been sinus rhythm  Per RN Puriwick is not able to secure in place due to body habitus, stool is not loose enough to warrant rectal tube  She is n.p.o. waiting to go to the OR for wound care  Her blood pressure is elevated   Assessment/Plan: Principal Problem:   Osteomyelitis ([place]) Active Problems:   Acute metabolic encephalopathy   Severe sepsis ([place])   Abscess of multiple sites of buttock   AF (paroxysmal atrial fibrillation) ([place])  Hypoxia   Sepsis presented on admission with , left buttock abscess, stage IV pressure ulcer present on admission -Status post I&D left buttock abscess on April 23, wound is actively draining purulent material, general surgery plan to further evaluation the OR once adequate is aware of, last dose of Eliquis was given on April 20 6 in the morning around 10:30 AM, Resume Eliquis when okay with general surgery,-Going to the OR today -Wound culture grew RARE STAPHYLOCOCCUS WARNERI  -blood culture Streptococcus viridans only in one of the anaerobic bottle only is considered contamination -Echocardiogram no mention of vegation -Has been on vancomycin cefepime and Flagyl from admission to 4/28, now on vanc and unasyn per  ID recommendation --plan per general surgery and ID  Possible HCAP CT scan abdomen raised the question of a possible right middle and lower lobe infiltrate - patient is not hypoxic on her baseline oxygen support -by report she has chronic respiratory failure on 2 L home oxygen broad antibiotic being used for abscess should provide adequate coverage  She appear tachypneic today, though no cough, no fever, will add mucinex, incentive spirometer,  repeat cxr no significant interval changes  Acute metabolic encephalopathy versus chronic cognitive deficit Baseline mental status not clear but reportedly the patient is confused at baseline - B12, ammonia, and folic acid are all normal  -She is only oriented to self, not to time or place, she  following commands  Paroxysmal A. fib -change iv lopressor to  higher dose of coreg due to elevated blood pressure  -Eliquis on hold for now as it appears patient may require return to the OR  Hypertension Blood pressure elevated, restart Norvasc, change Lopressor to Coreg, increase lisinopril   HLD Continue Lipitor  Insulin-dependent type 2 diabetes, uncontrolled with hyperglycemia A1c 7.8 Currently on Lantus 10 subcu daily and SSI, other  home meds Janumet Victoza held since admission, plan to resume at discharge A.m. 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   DVT Prophylaxis: Eliquis held, currently on SCD  Code Status: Full  Family Communication: patient   Disposition Plan:    Patient came from:            [facility]                                                                                               Anticipated d/c place:  Return to SNF  Barriers to d/c OR conditions which need to be met to effect a safe d/c:  Not medically ready to discharge, due to ongoing work-up and management for sacral wound, needs general surgery and infectious disease clearance  Consultants:  General surgery  Infectious disease  Procedures: Status post I&D left buttock abscess on April 23  Antibiotics:  As above   Objective: BP (!) 154/73 (BP Location: Right Arm)   Pulse 96   Temp 98.5 F (36.9 C) (Axillary)   Resp (!) 36   Ht _0  (1.676 m)   Wt 110 kg   SpO2 95%   BMI 39.14 kg/m   Intake/Output Summary (Last 24 hours) at 12/10/2019 0654 Last data filed at 12/10/2019 0600 Gross per 24 hour  Intake 220 ml  Output 400 ml  Net -180 ml   Filed Weights   12/03/19 1256  Weight: 110 kg    Exam: Patient is examined daily including today on 12/10/2019, exams remain the same as of yesterday except that has changed    General:  NAD, appears confused, but calm and cooperative,  following commands  Cardiovascular: RRR  Respiratory: Diminished at bases, no wheezing, no rales, no rhonchi  Abdomen: Soft/ND/NT, positive BS  Musculoskeletal: No Edema, on bilateral foot protectors  Neuro: alert, oriented to self only  Data Reviewed: Basic Metabolic Panel: Recent Labs  Lab 12/03/19 1332 12/03/19 1332 12/04/19 0326 12/04/19 0326 12/04/19 1129 12/05/19 0519 12/06/19 0344 12/08/19 0355 12/09/19 0500  NA 139   < > 140   < > 141 143 142 138 142  K 4.5   < > 4.8   < > 3.8 4.3 4.0 2.9* 4.2  CL 94*   < >  100  --   --  100 100 91* 97*  CO2 35*   < > 32  --   --  37* 35* 38* 38*  GLUCOSE 139*   < > 139*  --   --  181* 106* 199* 173*  BUN 13   < > 10  --   --  _0 CREATININE 0.47   < > 0.41*  --   --  0.41* 0.35* 0.54 0.37*  CALCIUM 8.6*   < > 7.9*  --   --  8.6* 8.4* 8.2* 8.2*  MG 1.7  --   --   --   --  2.0  --  1.5* 1.8  PHOS  --   --   --   --   --  2.3*  --   --   --    < > = values in this interval not displayed.   Liver Function Tests: Recent Labs  Lab 12/03/19 1332 12/05/19 0519 12/06/19 0344 12/08/19 0355  AST 45* _1 ALT 43 _2 ALKPHOS 70 51 51 85  BILITOT 0.3 0.5 0.5 0.3  PROT 6.6 5.6* 5.4* 5.6*  ALBUMIN 1.5* 1.5* 1.4* 1.5*   No results for input(s): LIPASE, AMYLASE in the last 168 hours. Recent Labs  Lab 12/06/19 0344  AMMONIA 19   CBC: Recent Labs  Lab 12/03/19 1332 12/04/19 0326 12/05/19 0519 12/06/19 0344 12/07/19 0510 12/08/19 0355 12/09/19 0500  WBC 12.6*   < > 10.3 10.1 13.0* 10.7* 11.1*  NEUTROABS 10.8*  --   --   --   --   --  7.1  HGB 10.4*   < > 8.5* 8.7* 10.2* 10.0* 9.6*  HCT 36.8   < > 30.2* 30.6* 33.9* 33.4* 32.2*  MCV 97.6   < > 97.1 97.5 90.9 90.3 93.1  PLT 323   < > 317 310 350 360 358   < > = values in this interval not displayed.   Cardiac Enzymes:   No results for input(s): CKTOTAL, CKMB, CKMBINDEX, TROPONINI in the last 168 hours. BNP (last 3 results) Recent Labs     09/07/19 1950  BNP 37.6    ProBNP (last 3 results) No results for input(s): PROBNP in the last 8760 hours.  CBG: Recent Labs  Lab 12/08/19 2036 12/09/19 0723 12/09/19 1159 12/09/19 1601 12/09/19 2116  GLUCAP 225* 155* 249* 149* 184*    Recent Results (from the past 240 hour(s))  Blood Culture (routine x 2)     Status: Abnormal   Collection Time: 12/03/19  1:15 PM   Specimen: BLOOD  Result Value Ref Range Status   Specimen Description BLOOD RIGHT ANTECUBITAL  Final   Special Requests   Final    BOTTLES DRAWN AEROBIC AND ANAEROBIC Blood Culture adequate volume   Culture  Setup Time   Final    GRAM POSITIVE COCCI IN CHAINS ANAEROBIC BOTTLE ONLY CRITICAL RESULT CALLED TO, READ BACK BY AND VERIFIED WITH: Sharen Heck PharmD 10:15 12/04/19 ([email or web address])    Culture (A)  Final    VIRIDANS STREPTOCOCCUS THE SIGNIFICANCE OF ISOLATING THIS ORGANISM FROM A SINGLE SET OF BLOOD CULTURES WHEN MULTIPLE SETS ARE DRAWN IS UNCERTAIN. PLEASE NOTIFY THE MICROBIOLOGY DEPARTMENT WITHIN ONE WEEK IF SPECIATION AND SENSITIVITIES ARE REQUIRED. Performed at [facility], [place] [address] [place], [place] [address]    Report Status 12/06/2019 FINAL  Final  Blood Culture ID Panel (Reflexed)  Status: Abnormal   Collection Time: 12/03/19  1:15 PM  Result Value Ref Range Status   Enterococcus species NOT DETECTED NOT DETECTED Final   Listeria monocytogenes NOT DETECTED NOT DETECTED Final   Staphylococcus species NOT DETECTED NOT DETECTED Final   Staphylococcus aureus (BCID) NOT DETECTED NOT DETECTED Final   Streptococcus species DETECTED (A) NOT DETECTED Final    Comment: Not Enterococcus species, Streptococcus agalactiae, Streptococcus pyogenes, or Streptococcus pneumoniae. CRITICAL RESULT CALLED TO, READ BACK BY AND VERIFIED WITH: Sharen Heck PharmD 10:15 12/04/19 ([email or web address])    Streptococcus agalactiae NOT DETECTED NOT DETECTED Final   Streptococcus pneumoniae NOT DETECTED NOT DETECTED Final    Streptococcus pyogenes NOT DETECTED NOT DETECTED Final   Acinetobacter baumannii NOT DETECTED NOT DETECTED Final   Enterobacteriaceae species NOT DETECTED NOT DETECTED Final   Enterobacter cloacae complex NOT DETECTED NOT DETECTED Final   Escherichia coli NOT DETECTED NOT DETECTED Final   Klebsiella oxytoca NOT DETECTED NOT DETECTED Final   Klebsiella pneumoniae NOT DETECTED NOT DETECTED Final   Proteus species NOT DETECTED NOT DETECTED Final   Serratia marcescens NOT DETECTED NOT DETECTED Final   Haemophilus influenzae NOT DETECTED NOT DETECTED Final   Neisseria meningitidis NOT DETECTED NOT DETECTED Final   Pseudomonas aeruginosa NOT DETECTED NOT DETECTED Final   Candida albicans NOT DETECTED NOT DETECTED Final   Candida glabrata NOT DETECTED NOT DETECTED Final   Candida krusei NOT DETECTED NOT DETECTED Final   Candida parapsilosis NOT DETECTED NOT DETECTED Final   Candida tropicalis NOT DETECTED NOT DETECTED Final    Comment: Performed at [facility], [place]. [address], [place], [place] [address]  Blood Culture (routine x 2)     Status: None   Collection Time: 12/03/19  1:33 PM   Specimen: BLOOD LEFT WRIST  Result Value Ref Range Status   Specimen Description BLOOD LEFT WRIST  Final   Special Requests   Final    BOTTLES DRAWN AEROBIC ONLY Blood Culture results may not be optimal due to an inadequate volume of blood received in culture bottles   Culture   Final    NO GROWTH 5 DAYS Performed at [facility], [place] [address] [place], [place] [address]    Report Status 12/08/2019 FINAL  Final  Urine culture     Status: None   Collection Time: 12/03/19  5:20 PM   Specimen: In/Out Cath Urine  Result Value Ref Range Status   Specimen Description IN/OUT CATH URINE  Final   Special Requests NONE  Final   Culture   Final    NO GROWTH Performed at [facility], [place] [address] [place], [place] [address]    Report Status 12/05/2019 FINAL  Final  Respiratory Panel by  RT PCR (Flu A&B, Covid) - Nasopharyngeal Swab     Status: None   Collection Time: 12/03/19 11:17 PM   Specimen: Nasopharyngeal Swab  Result Value Ref Range Status   SARS Coronavirus 2 by RT PCR NEGATIVE NEGATIVE Final    Comment: (NOTE) SARS-CoV-2 target nucleic acids are NOT DETECTED. The SARS-CoV-2 RNA is generally detectable in upper respiratoy specimens during the acute phase of infection. The lowest concentration of SARS-CoV-2 viral copies this assay can detect is 131 copies/mL. A negative result does not preclude SARS-Cov-2 infection and should not be used as the sole basis for treatment or other patient management decisions. A negative result may occur with  improper specimen collection/handling, submission of specimen other than nasopharyngeal swab,  presence of viral mutation(s) within the areas targeted by this assay, and inadequate number of viral copies (<131 copies/mL). A negative result must be combined with clinical observations, patient history, and epidemiological information. The expected result is Negative. Fact Sheet for Patients:  PinkCheek.be Fact Sheet for Healthcare Providers:  GravelBags.it This test is not yet ap proved or cleared by the [place] FDA and  has been authorized for detection and/or diagnosis of SARS-CoV-2 by FDA under an Emergency Use Authorization (EUA). This EUA will remain  in effect (meaning this test can be used) for the duration of the COVID-19 declaration under Section 564(b)(1) of the Act, 21 U.S.C. section 360bbb-3(b)(1), unless the authorization is terminated or revoked sooner.    Influenza A by PCR NEGATIVE NEGATIVE Final   Influenza B by PCR NEGATIVE NEGATIVE Final    Comment: (NOTE) The Xpert Xpress SARS-CoV-2/FLU/RSV assay is intended as an aid in  the diagnosis of influenza from Nasopharyngeal swab specimens and  should not be used as a sole basis for treatment.  Nasal washings and  aspirates are unacceptable for Xpert Xpress SARS-CoV-2/FLU/RSV  testing. Fact Sheet for Patients: PinkCheek.be Fact Sheet for Healthcare Providers: GravelBags.it This test is not yet approved or cleared by the [place] FDA and  has been authorized for detection and/or diagnosis of SARS-CoV-2 by  FDA under an Emergency Use Authorization (EUA). This EUA will remain  in effect (meaning this test can be used) for the duration of the  Covid-19 declaration under Section 564(b)(1) of the Act, 21  U.S.C. section 360bbb-3(b)(1), unless the authorization is  terminated or revoked. Performed at [facility], [place] [address] [place], [place] [address]   MRSA PCR Screening     Status: Abnormal   Collection Time: 12/04/19  6:13 AM   Specimen: Nasal Mucosa; Nasopharyngeal  Result Value Ref Range Status   MRSA by PCR POSITIVE (A) NEGATIVE Final    Comment:        The GeneXpert MRSA Assay (FDA approved for NASAL specimens only), is one component of a comprehensive MRSA colonization surveillance program. It is not intended to diagnose MRSA infection nor to guide or monitor treatment for MRSA infections. RESULT CALLED TO, READ BACK BY AND VERIFIED WITH: Diannia Ruder RN 8:40 12/04/19 ([email or web address]) Performed at [facility], [place] [address], [place], [place] [address]   Aerobic/Anaerobic Culture (surgical/deep wound)     Status: None   Collection Time: 12/04/19 10:21 AM   Specimen: PATH Other; Tissue  Result Value Ref Range Status   Specimen Description TISSUE  Final   Special Requests LEFT BUTTOCK ABSCESS SPEC A  Final   Gram Stain   Final    RARE WBC PRESENT,BOTH PMN AND MONONUCLEAR NO ORGANISMS SEEN    Culture   Final    RARE STAPHYLOCOCCUS WARNERI RARE STREPTOCOCCUS ANGINOSIS CRITICAL RESULT CALLED TO, READ BACK BY AND VERIFIED WITH: RN M LESSARD [id number] AT 1025 AM BY CM FEW PREVOTELLA DENTICOLA FEW  BACTEROIDES THETAIOTAOMICRON ORG 4 AND 5 BETA LACTAMASE POSITIVE Performed at [facility], [place] [address] [place], [place] [address]    Report Status 12/09/2019 FINAL  Final   Organism ID, Bacteria STAPHYLOCOCCUS WARNERI  Final   Organism ID, Bacteria STREPTOCOCCUS ANGINOSIS  Final      Susceptibility   Staphylococcus warneri - MIC*    CIPROFLOXACIN >=8 RESISTANT Resistant     ERYTHROMYCIN RESISTANT Resistant     GENTAMICIN <=0.5 SENSITIVE Sensitive  OXACILLIN >=4 RESISTANT Resistant     TETRACYCLINE >=16 RESISTANT Resistant     VANCOMYCIN <=0.5 SENSITIVE Sensitive     TRIMETH/SULFA <=10 SENSITIVE Sensitive     CLINDAMYCIN RESISTANT Resistant     RIFAMPIN <=0.5 SENSITIVE Sensitive     Inducible Clindamycin POSITIVE Resistant     * RARE STAPHYLOCOCCUS WARNERI   Streptococcus anginosis - MIC*    PENICILLIN <=0.06 SENSITIVE Sensitive     CEFTRIAXONE 0.25 SENSITIVE Sensitive     ERYTHROMYCIN <=0.12 SENSITIVE Sensitive     LEVOFLOXACIN 0.5 SENSITIVE Sensitive     VANCOMYCIN 0.5 SENSITIVE Sensitive     * RARE STREPTOCOCCUS ANGINOSIS     Studies: DG CHEST PORT 1 VIEW  Result Date: 12/09/2019 CLINICAL DATA:  Hypoxia EXAM: PORTABLE CHEST 1 VIEW COMPARISON:  12/03/2019 FINDINGS: Elevation of the right hemidiaphragm. Right base atelectasis. No confluent opacity on the left. Mild cardiomegaly. No effusions or edema. No acute bony abnormality. IMPRESSION: Stable elevation of the right hemidiaphragm with right base atelectasis. Electronically Signed   By: Rolm Baptise M.D.   On: 12/09/2019 10:51    Scheduled Meds: . amLODipine  10 mg Oral Daily  . ascorbic acid  500 mg Oral BID  . atorvastatin  10 mg Oral Daily  . calcium carbonate  1 tablet Oral Daily  . carvedilol  25 mg Oral BID WC  . Chlorhexidine Gluconate Cloth  6 each Topical Q0600  . cholecalciferol  1,000 Units Oral Daily  . divalproex  125 mg Oral QHS  . divalproex  500 mg Oral Daily  . guaiFENesin  600 mg Oral  BID  . insulin aspart  0-20 Units Subcutaneous TID WC  . insulin aspart  0-5 Units Subcutaneous QHS  . insulin glargine  10 Units Subcutaneous Daily  . lisinopril  20 mg Oral Daily  . mupirocin ointment  1 application Nasal BID  . senna-docusate  1 tablet Oral Daily  . sodium chloride flush  10-40 mL Intracatheter Q12H    Continuous Infusions: . ampicillin-sulbactam (UNASYN) IV 3 g (12/10/19 0205)  . vancomycin 1,500 mg (12/09/19 0828)     Time spent: 35 mins I have personally reviewed and interpreted on  12/10/2019 daily labs, tele strips, imagings as discussed above under date review session and assessment and plans.  I reviewed all nursing notes, pharmacy notes, consultant notes,  vitals, pertinent old records  I have discussed plan of care as described above with RN , patient  on 12/10/2019   Florencia Reasons MD, PhD, FACP  [facility]  Available via Epic secure chat 7am-7pm for nonurgent issues Please page for urgent issues, pager number available through [email or web address] .   12/10/2019, 6:54 AM  LOS: 7 days

## 2019-12-10 NOTE — Progress Notes (Signed)
Patient ID: Becky Hendricks, female   DOB: 1946-02-15, 74 y.o.   MRN: [id number]   Pre Procedure note for inpatients:   Becky Hendricks has been scheduled for Procedure(s): IRRIGATION AND DEBRIDEMENT LEFT BUTTOCK ABSCESS (Left) today. The various methods of treatment have been discussed with the patient. After consideration of the risks, benefits and treatment options the patient has consented to the planned procedure.   The patient has been seen and labs reviewed. There are no changes in the patient's condition to prevent proceeding with the planned procedure today.  Recent labs:  Lab Results  Component Value Date   WBC 11.1 (H) 12/09/2019   HGB 9.6 (L) 12/09/2019   HCT 32.2 (L) 12/09/2019   PLT 358 12/09/2019   GLUCOSE 162 (H) 12/10/2019   TRIG 157 (H) 09/12/2019   ALT 18 12/08/2019   AST 16 12/08/2019   NA 139 12/10/2019   K 4.4 12/10/2019   CL 93 (L) 12/10/2019   CREATININE 0.38 (L) 12/10/2019   BUN 9 12/10/2019   CO2 38 (H) 12/10/2019   INR 1.7 (H) 12/03/2019   HGBA1C 7.8 (H) 12/03/2019    Abigail Miyamoto, MD 12/10/2019 10:57 AM

## 2019-12-10 NOTE — Op Note (Signed)
   [facility] 12/10/2019   Pre-op Diagnosis: left buttock abscess     Post-op Diagnosis: SAME  Procedure(s): REPEAT IRRIGATION AND DRAINAGE LEFT BUTTOCK  ABSCESS  Surgeon(s): Abigail Miyamoto, MD  Anesthesia: General  Staff:  Circulator: Hermelinda Dellen, RN Scrub Person: Emi Belfast T  Estimated Blood Loss: Minimal               Indications: Because of ongoing purulence from her wounds the decision has been made to proceed to the operating room to reexplore the wounds and wash them out.  Findings: There were no new abscess collections.  There was some loculated purulence in the depths of the wound which I broke up further bluntly.  I bluntly widen the communication from the buttocks to the sacrum wider to allow better drainage and packing  Procedure: The patient was brought to operating identifies correct patient.  She is placed supine on the operating table and general anesthesia was induced.  The patient was then turned to the right lateral decubitus position.  I removed the packing from her all 3 of her wounds.  I then explored them.  There was some mildly loculated purulent fluid at the base of the wounds.  This did communicate with a sacral decubitus.  I was able to break up the collection bluntly with my finger and then widen the communication to the sacrum to allow better drainage.  No debridement was needed.  I then thoroughly irrigated all wounds with saline.  I then repacked all wounds with wet-to-dry saline soaked Kerlix.  Dry gauze was placed over this.  The patient tolerated procedure well.  All the counts were correct at the end of the procedure.  Patient was then extubated in the operating room and taken in stable addition to the recovery room.          Abigail Miyamoto   Date: 12/10/2019  Time: 12:58 PM

## 2019-12-11 DIAGNOSIS — S31000A Unspecified open wound of lower back and pelvis without penetration into retroperitoneum, initial encounter: Secondary | ICD-10-CM

## 2019-12-11 LAB — BASIC METABOLIC PANEL
Anion gap: 9 (ref 5–15)
BUN: 11 mg/dL (ref 8–23)
CO2: 37 mmol/L — ABNORMAL HIGH (ref 22–32)
Calcium: 8.1 mg/dL — ABNORMAL LOW (ref 8.9–10.3)
Chloride: 96 mmol/L — ABNORMAL LOW (ref 98–111)
Creatinine, Ser: 0.45 mg/dL (ref 0.44–1.00)
GFR calc Af Amer: >60 mL/min (ref >60)
GFR calc non Af Amer: >60 mL/min (ref >60)
Glucose, Bld: 130 mg/dL — ABNORMAL HIGH (ref 70–99)
Potassium: 4.1 mmol/L (ref 3.5–5.1)
Sodium: 142 mmol/L (ref 135–145)

## 2019-12-11 LAB — GLUCOSE, CAPILLARY
Glucose-Capillary: 134 mg/dL — ABNORMAL HIGH (ref 70–99)
Glucose-Capillary: 194 mg/dL — ABNORMAL HIGH (ref 70–99)
Glucose-Capillary: 161 mg/dL — ABNORMAL HIGH (ref 70–99)
Glucose-Capillary: 101 mg/dL — ABNORMAL HIGH (ref 70–99)

## 2019-12-11 LAB — CBC WITH DIFFERENTIAL/PLATELET
Abs Immature Granulocytes: 0.26 10*3/uL — ABNORMAL HIGH (ref 0.00–0.07)
Basophils Absolute: 0.1 10*3/uL (ref 0.0–0.1)
Basophils Relative: 0 %
Eosinophils Absolute: 0 10*3/uL (ref 0.0–0.5)
Eosinophils Relative: 0 %
HCT: 28.8 % — ABNORMAL LOW (ref 36.0–46.0)
Hemoglobin: 8.1 g/dL — ABNORMAL LOW (ref 12.0–15.0)
Immature Granulocytes: 2 %
Lymphocytes Relative: 17 %
Lymphs Abs: 2 10*3/uL (ref 0.7–4.0)
MCH: 27.7 pg (ref 26.0–34.0)
MCHC: 28.1 g/dL — ABNORMAL LOW (ref 30.0–36.0)
MCV: 98.6 fL (ref 80.0–100.0)
Monocytes Absolute: 1.3 10*3/uL — ABNORMAL HIGH (ref 0.1–1.0)
Monocytes Relative: 11 %
Neutro Abs: 7.8 10*3/uL — ABNORMAL HIGH (ref 1.7–7.7)
Neutrophils Relative %: 70 %
Platelets: 311 10*3/uL (ref 150–400)
RBC: 2.92 MIL/uL — ABNORMAL LOW (ref 3.87–5.11)
RDW: 16.6 % — ABNORMAL HIGH (ref 11.5–15.5)
WBC: 11.4 10*3/uL — ABNORMAL HIGH (ref 4.0–10.5)
nRBC: 0.4 % — ABNORMAL HIGH (ref 0.0–0.2)

## 2019-12-11 LAB — PHOSPHORUS: Phosphorus: 4.4 mg/dL (ref 2.5–4.6)

## 2019-12-11 LAB — MAGNESIUM: Magnesium: 1.9 mg/dL (ref 1.7–2.4)

## 2019-12-11 MED ORDER — LISINOPRIL 10 MG PO TABS
10.0000 mg | ORAL_TABLET | Freq: Every day | ORAL | Status: DC
  Administered 2019-12-11 – 2019-12-12 (×2): 10 mg via ORAL
  Filled 2019-12-11 (×2): qty 1

## 2019-12-11 NOTE — Progress Notes (Signed)
[facility] for Infectious Disease  Date of Admission:  12/03/2019      Total days of antibiotics 8  Vancomycin 8  Unasyn 3           ASSESSMENT: Becky Hendricks is a 74 y.o. 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 She has had sequential trips to the OR for cleaning up of wound. No further plans at this time for take back. Wound care will be essential to help prevent further infections and would get her back to Dr. Dellia Nims in the wound clinic for treatment/monitoring; given her essentially bed bound state I suspect she will incur further infections.  Optimize nutrition therapy and offloading with air mattress and turning schedule.   Will set up with IV Vancomycin + Unasyn & follow up as outlined below.   History of CDiff infection, details not documented. No diarrhea at this time. Will ensure this is still the case when we see her back in 2 weeks. Low threshold to start PO vancomycin therapy for treatment.     PLAN: 1. OPAT details as below  2. Monitor for diarrhea - please call with any concerning changes with watery diarrhea or mushy stools > 3 in 24h period.  3. Close follow up with creatinine on high risk vancomycin    OPAT ORDERS:  Diagnosis: Sacral osteomyelitis, large decubitus wound   Culture Result: staphylococcus warneri, streptococcus anginosis, prevotella denticola, bacteroides thetaiotaomicron   Allergies  Allergen Reactions  . Chlorhexidine Gluconate     itching     Discharge antibiotics to be given via PICC line:  Per pharmacy protocol Vancomycin  Aim for Vancomycin trough 15-20 or AUC 400-550 (unless otherwise indicated)  + Unasyn 3 gm IV Q6h  Duration: 6 weeks   End Date: 01/21/2020   [facility] Care Per Protocol with [employer] Use: Home health RN for IV administration and teaching, line care and labs.    Labs weekly while on IV antibiotics: _x_ CBC with differential _x_  BMP TWICE WEEKLY  __ CMP _x_ CRP _x_ ESR _x_ Vancomycin trough __ CK  _x_ Please pull PIC at completion of IV antibiotics __ Please leave PIC in place until doctor has seen patient or been notified  Fax weekly labs to [phone]   Clinic Follow Up Appt: May 14th virtual visit with Janene Madeira, NP @ 10:15 am.    Principal Problem:   Osteomyelitis [facility]) Active Problems:   Acute metabolic encephalopathy   Severe sepsis (HCC)   Abscess of multiple sites of buttock   AF (paroxysmal atrial fibrillation) ([place])   Hypoxia   . ascorbic acid  500 mg Oral BID  . atorvastatin  10 mg Oral Daily  . calcium carbonate  1 tablet Oral Daily  . carvedilol  25 mg Oral BID WC  . Chlorhexidine Gluconate Cloth  6 each Topical Q0600  . cholecalciferol  1,000 Units Oral Daily  . divalproex  125 mg Oral QHS  . divalproex  500 mg Oral Daily  . guaiFENesin  600 mg Oral BID  . insulin aspart  0-20 Units Subcutaneous TID WC  . insulin aspart  0-5 Units Subcutaneous QHS  . insulin glargine  10 Units Subcutaneous Daily  . lisinopril  10 mg Oral Daily  . mupirocin ointment  1 application Nasal BID  . senna-docusate  1 tablet Oral Daily  . sodium chloride flush  10-40 mL Intracatheter Q12H  SUBJECTIVE: No complaints.   Taken back to OR yesterday - op note with loculated purulent fluids at the base of the wounds communicating with sacral decubitus - this was broken up and irrigated; widened the path to help facilitate drainage and wound packing.  Plan to exchange midline for PICC given duration of IV antibiotic need.    Review of Systems: Review of Systems  Constitutional: Negative for chills, fever and malaise/fatigue.  Respiratory: Negative for cough and shortness of breath.   Cardiovascular: Negative for chest pain.  Gastrointestinal: Negative for abdominal pain, nausea and vomiting.  Genitourinary: Negative for dysuria.  Musculoskeletal: Negative for myalgias.  Skin: Negative  for itching and rash.     Allergies  Allergen Reactions  . Chlorhexidine Gluconate     itching    OBJECTIVE: Vitals:   12/11/19 0130 12/11/19 0416 12/11/19 0435 12/11/19 0800  BP:  (!) 96/51 115/66 120/70  Pulse: 62 (!) 54 71 72  Resp: (!) 29 (!) 26 16   Temp:  (!) 97.3 F (36.3 C)  97.7 F (36.5 C)  TempSrc:  Oral  Oral  SpO2: 95% 94% 95% 95%  Weight:      Height:       Body mass index is 39.14 kg/m.  Physical Exam Vitals reviewed.  Constitutional:      Appearance: Normal appearance.  HENT:     Mouth/Throat:     Mouth: Mucous membranes are moist.     Pharynx: No oropharyngeal exudate.  Eyes:     General: No scleral icterus.    Pupils: Pupils are equal, round, and reactive to light.  Cardiovascular:     Rate and Rhythm: Normal rate.  Pulmonary:     Effort: Pulmonary effort is normal.  Abdominal:     General: There is no distension.     Palpations: Abdomen is soft.  Skin:    General: Skin is warm and dry.     Capillary Refill: Capillary refill takes less than 2 seconds.  Neurological:     Mental Status: She is alert. Mental status is at baseline.     Lab Results Lab Results  Component Value Date   WBC 11.4 (H) 12/11/2019   HGB 8.1 (L) 12/11/2019   HCT 28.8 (L) 12/11/2019   MCV 98.6 12/11/2019   PLT 311 12/11/2019    Lab Results  Component Value Date   CREATININE 0.45 12/11/2019   BUN 11 12/11/2019   NA 142 12/11/2019   K 4.1 12/11/2019   CL 96 (L) 12/11/2019   CO2 37 (H) 12/11/2019    Lab Results  Component Value Date   ALT 18 12/08/2019   AST 16 12/08/2019   ALKPHOS 85 12/08/2019   BILITOT 0.3 12/08/2019     Microbiology: Recent Results (from the past 240 hour(s))  Blood Culture (routine x 2)     Status: Abnormal   Collection Time: 12/03/19  1:15 PM   Specimen: BLOOD  Result Value Ref Range Status   Specimen Description BLOOD RIGHT ANTECUBITAL  Final   Special Requests   Final    BOTTLES DRAWN AEROBIC AND ANAEROBIC Blood Culture  adequate volume   Culture  Setup Time   Final    GRAM POSITIVE COCCI IN CHAINS ANAEROBIC BOTTLE ONLY CRITICAL RESULT CALLED TO, READ BACK BY AND VERIFIED WITH: Sharen Heck PharmD 10:15 12/04/19 ([email or web address])    Culture (A)  Final    VIRIDANS STREPTOCOCCUS THE SIGNIFICANCE OF ISOLATING THIS ORGANISM FROM A SINGLE SET OF BLOOD CULTURES  WHEN MULTIPLE SETS ARE DRAWN IS UNCERTAIN. PLEASE NOTIFY THE MICROBIOLOGY DEPARTMENT WITHIN ONE WEEK IF SPECIATION AND SENSITIVITIES ARE REQUIRED. Performed at [facility], [address] [place], [place] [address]    Report Status 12/06/2019 FINAL  Final  Blood Culture ID Panel (Reflexed)     Status: Abnormal   Collection Time: 12/03/19  1:15 PM  Result Value Ref Range Status   Enterococcus species NOT DETECTED NOT DETECTED Final   Listeria monocytogenes NOT DETECTED NOT DETECTED Final   Staphylococcus species NOT DETECTED NOT DETECTED Final   Staphylococcus aureus (BCID) NOT DETECTED NOT DETECTED Final   Streptococcus species DETECTED (A) NOT DETECTED Final    Comment: Not Enterococcus species, Streptococcus agalactiae, Streptococcus pyogenes, or Streptococcus pneumoniae. CRITICAL RESULT CALLED TO, READ BACK BY AND VERIFIED WITH: T. Baumeister PharmD 10:15 12/04/19 ([email or web address])    Streptococcus agalactiae NOT DETECTED NOT DETECTED Final   Streptococcus pneumoniae NOT DETECTED NOT DETECTED Final   Streptococcus pyogenes NOT DETECTED NOT DETECTED Final   Acinetobacter baumannii NOT DETECTED NOT DETECTED Final   Enterobacteriaceae species NOT DETECTED NOT DETECTED Final   Enterobacter cloacae complex NOT DETECTED NOT DETECTED Final   Escherichia coli NOT DETECTED NOT DETECTED Final   Klebsiella oxytoca NOT DETECTED NOT DETECTED Final   Klebsiella pneumoniae NOT DETECTED NOT DETECTED Final   Proteus species NOT DETECTED NOT DETECTED Final   Serratia marcescens NOT DETECTED NOT DETECTED Final   Haemophilus influenzae NOT DETECTED NOT DETECTED Final    Neisseria meningitidis NOT DETECTED NOT DETECTED Final   Pseudomonas aeruginosa NOT DETECTED NOT DETECTED Final   Candida albicans NOT DETECTED NOT DETECTED Final   Candida glabrata NOT DETECTED NOT DETECTED Final   Candida krusei NOT DETECTED NOT DETECTED Final   Candida parapsilosis NOT DETECTED NOT DETECTED Final   Candida tropicalis NOT DETECTED NOT DETECTED Final    Comment: Performed at [facility], [address] [place], [place] [address]  Blood Culture (routine x 2)     Status: None   Collection Time: 12/03/19  1:33 PM   Specimen: BLOOD LEFT WRIST  Result Value Ref Range Status   Specimen Description BLOOD LEFT WRIST  Final   Special Requests   Final    BOTTLES DRAWN AEROBIC ONLY Blood Culture results may not be optimal due to an inadequate volume of blood received in culture bottles   Culture   Final    NO GROWTH 5 DAYS Performed at [facility], [address] [place], [place] [address]    Report Status 12/08/2019 FINAL  Final  Urine culture     Status: None   Collection Time: 12/03/19  5:20 PM   Specimen: In/Out Cath Urine  Result Value Ref Range Status   Specimen Description IN/OUT CATH URINE  Final   Special Requests NONE  Final   Culture   Final    NO GROWTH Performed at [facility], [address] [place], [place] [address]    Report Status 12/05/2019 FINAL  Final  Respiratory Panel by RT PCR (Flu A&B, Covid) - Nasopharyngeal Swab     Status: None   Collection Time: 12/03/19 11:17 PM   Specimen: Nasopharyngeal Swab  Result Value Ref Range Status   SARS Coronavirus 2 by RT PCR NEGATIVE NEGATIVE Final    Comment: (NOTE) SARS-CoV-2 target nucleic acids are NOT DETECTED. The SARS-CoV-2 RNA is generally detectable in upper respiratoy specimens during the acute phase of infection. The lowest concentration   of SARS-CoV-2 viral copies this assay can detect is 131 copies/mL. A negative result does not preclude SARS-Cov-2 infection and should not  be used as the sole basis for treatment or other patient management decisions. A negative result may occur with  improper specimen collection/handling, submission of specimen other than nasopharyngeal swab, presence of viral mutation(s) within the areas targeted by this assay, and inadequate number of viral copies (<131 copies/mL). A negative result must be combined with clinical observations, patient history, and epidemiological information. The expected result is Negative. Fact Sheet for Patients:  https://www.fda.gov/media/142436/download Fact Sheet for Healthcare Providers:  https://www.fda.gov/media/142435/download This test is not yet ap proved or cleared by the [place] FDA and  has been authorized for detection and/or diagnosis of SARS-CoV-2 by FDA under an Emergency Use Authorization (EUA). This EUA will remain  in effect (meaning this test can be used) for the duration of the COVID-19 declaration under Section 564(b)(1) of the Act, 21 U.S.C. section 360bbb-3(b)(1), unless the authorization is terminated or revoked sooner.    Influenza A by PCR NEGATIVE NEGATIVE Final   Influenza B by PCR NEGATIVE NEGATIVE Final    Comment: (NOTE) The Xpert Xpress SARS-CoV-2/FLU/RSV assay is intended as an aid in  the diagnosis of influenza from Nasopharyngeal swab specimens and  should not be used as a sole basis for treatment. Nasal washings and  aspirates are unacceptable for Xpert Xpress SARS-CoV-2/FLU/RSV  testing. Fact Sheet for Patients: https://www.fda.gov/media/142436/download Fact Sheet for Healthcare Providers: https://www.fda.gov/media/142435/download This test is not yet approved or cleared by the [place] FDA and  has been authorized for detection and/or diagnosis of SARS-CoV-2 by  FDA under an Emergency Use Authorization (EUA). This EUA will remain  in effect (meaning this test can be used) for the duration of the  Covid-19 declaration under Section 564(b)(1) of  the Act, 21  U.S.C. section 360bbb-3(b)(1), unless the authorization is  terminated or revoked. Performed at [facility], [address] [place], [place] [address]   MRSA PCR Screening     Status: Abnormal   Collection Time: 12/04/19  6:13 AM   Specimen: Nasal Mucosa; Nasopharyngeal  Result Value Ref Range Status   MRSA by PCR POSITIVE (A) NEGATIVE Final    Comment:        The GeneXpert MRSA Assay (FDA approved for NASAL specimens only), is one component of a comprehensive MRSA colonization surveillance program. It is not intended to diagnose MRSA infection nor to guide or monitor treatment for MRSA infections. RESULT CALLED TO, READ BACK BY AND VERIFIED WITH: M. Brooks RN 8:40 12/04/19 ([email or web address]) Performed at [facility], [address] [place], [place] [address]   Aerobic/Anaerobic Culture (surgical/deep wound)     Status: None   Collection Time: 12/04/19 10:21 AM   Specimen: PATH Other; Tissue  Result Value Ref Range Status   Specimen Description TISSUE  Final   Special Requests LEFT BUTTOCK ABSCESS SPEC A  Final   Gram Stain   Final    RARE WBC PRESENT,BOTH PMN AND MONONUCLEAR NO ORGANISMS SEEN    Culture   Final    RARE STAPHYLOCOCCUS WARNERI RARE STREPTOCOCCUS ANGINOSIS CRITICAL RESULT CALLED TO, READ BACK BY AND VERIFIED WITH: RN M LESSARD [id number] AT 1025 AM BY CM FEW PREVOTELLA DENTICOLA FEW BACTEROIDES THETAIOTAOMICRON ORG 4 AND 5 BETA LACTAMASE POSITIVE Performed at [facility], [address] [place], [place] [address]    Report Status 12/09/2019 FINAL  Final   Organism ID,   Bacteria STAPHYLOCOCCUS WARNERI  Final   Organism ID, Bacteria STREPTOCOCCUS ANGINOSIS  Final      Susceptibility   Staphylococcus warneri - MIC*    CIPROFLOXACIN >=8 RESISTANT Resistant     ERYTHROMYCIN RESISTANT Resistant     GENTAMICIN <=0.5 SENSITIVE Sensitive     OXACILLIN >=4 RESISTANT Resistant     TETRACYCLINE >=16 RESISTANT Resistant     VANCOMYCIN  <=0.5 SENSITIVE Sensitive     TRIMETH/SULFA <=10 SENSITIVE Sensitive     CLINDAMYCIN RESISTANT Resistant     RIFAMPIN <=0.5 SENSITIVE Sensitive     Inducible Clindamycin POSITIVE Resistant     * RARE STAPHYLOCOCCUS WARNERI   Streptococcus anginosis - MIC*    PENICILLIN <=0.06 SENSITIVE Sensitive     CEFTRIAXONE 0.25 SENSITIVE Sensitive     ERYTHROMYCIN <=0.12 SENSITIVE Sensitive     LEVOFLOXACIN 0.5 SENSITIVE Sensitive     VANCOMYCIN 0.5 SENSITIVE Sensitive     * RARE STREPTOCOCCUS ANGINOSIS     , MSN, NP-C [facility]  [email or web address] Pager: [phone] Office: [phone] [facility] Main Line: [phone]  

## 2019-12-11 NOTE — Progress Notes (Signed)
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 s/p I&D L buttocks abscess on 4/23, repeat I&D 4/29.    PT Comments    Pt minimally engaging in PT this day, requiring total assist + for bed mobility, EOB "sitting" (pt being held up by PT and RN), and safe and pressure-relieving positioning in bed. Pt appeared exasperated during session, stating "that is enough" over and over again. PT attempted to engage pt in LE exercises, pt would not tolerate. PT to continue to follow acutely in hopes of increasing involvement with PT and progressing mobility.    Follow Up Recommendations  SNF;Supervision/Assistance - 24 hour     Equipment Recommendations  None recommended by PT    Recommendations for Other Services       Precautions / Restrictions Precautions Precautions: Fall Precaution Comments: sacral wound - pressure relief Restrictions Weight Bearing Restrictions: No    Mobility  Bed Mobility Overal bed mobility: Needs Assistance Bed Mobility: Rolling;Sidelying to Sit;Sit to Supine Rolling: Total assist;+2 for physical assistance;+2 for safety/equipment Sidelying to sit: Total assist;+2 for physical assistance;+2 for safety/equipment   Sit to supine: Total assist;+2 for physical assistance;+2 for safety/equipment   General bed mobility comments: Total +2 for rolling towards L in preparation for sitting up, total assist for sidelying<>sit for trunk and LE management, scooting to EOB. PT attempted to facilitate pt trunk flexion for EOB sitting, but pt with  heavy posterior leaning and states "that's enough". Pt required total +2 to quasi-sit EOB for 10 seconds, pt assisting 0%.  Transfers                 General transfer comment: unable this day  Ambulation/Gait                 Stairs             Wheelchair Mobility    Modified Rankin (Stroke Patients Only)       Balance Overall balance assessment: Needs assistance Sitting-balance support: Feet supported;Bilateral upper extremity supported Sitting balance-Leahy Scale: Zero Sitting balance - Comments: total +2 to quasi-sit EOB, pt assisting 0% in sitting EOB ~15 seconds       Standing balance comment: unable                            Cognition Arousal/Alertness: Awake/alert Behavior During Therapy: Flat affect;Restless Overall Cognitive Status: No family/caregiver present to determine baseline cognitive functioning Area of Impairment: Orientation;Following commands;Safety/judgement;Problem solving;Awareness;Attention                 Orientation Level: Disoriented to;Place;Time;Situation Current Attention Level: Focused   Following Commands: Follows one step commands inconsistently;Follows one step commands with increased time Safety/Judgement: Decreased awareness of deficits;Decreased awareness of safety Awareness: Intellectual Problem Solving: Slow processing;Decreased initiation;Difficulty sequencing;Requires tactile cues;Requires verbal cues General Comments: Pt very limited in engagement with PT today, only states "that is enough", "yes", "no" during session. Very minimal command following if at all.      Exercises      General Comments General comments (skin integrity,  edema, etc.): prevalon boots donned bilaterally, pillow placed between proximal legs to facilitate slight hip abduction vs hip neutral for pericare purposes and for pressure relief      Pertinent Vitals/Pain Pain Assessment: Faces Faces Pain Scale: Hurts  whole lot Pain Location: sacrum Pain Descriptors / Indicators: Sore;Discomfort;Grimacing;Guarding Pain Intervention(s): Limited activity within patient's tolerance;Monitored during session;Repositioned    Home Living                      Prior Function            PT Goals (current goals can now be found in the care plan section) Acute Rehab PT Goals PT Goal Formulation: With patient Time For Goal Achievement: 12/21/19 Potential to Achieve Goals: Fair Progress towards PT goals: Not progressing toward goals - comment(total care at this point)    Frequency    Min 2X/week      PT Plan Current plan remains appropriate    Co-evaluation              AM-PAC PT "6 Clicks" Mobility   Outcome Measure  Help needed turning from your back to your side while in a flat bed without using bedrails?: Total Help needed moving from lying on your back to sitting on the side of a flat bed without using bedrails?: Total Help needed moving to and from a bed to a chair (including a wheelchair)?: Total Help needed standing up from a chair using your arms (e.g., wheelchair or bedside chair)?: Total Help needed to walk in hospital room?: Total Help needed climbing 3-5 steps with a railing? : Total 6 Click Score: 6    End of Session   Activity Tolerance: Patient limited by fatigue;Patient limited by pain Patient left: in bed;with call bell/phone within reach;with bed alarm set;with nursing/sitter in room;with SCD's reapplied;Other (comment)(prevalon boots donned) Nurse Communication: Mobility status PT Visit Diagnosis: Other abnormalities of gait and mobility (R26.89);Muscle weakness (generalized) (M62.81)     Time: 1000-1010 PT Time Calculation (min) (ACUTE ONLY): 10 min  Charges:  $Therapeutic Activity: 8-22 mins                     Jawanza Zambito E, PT Acute Rehabilitation Services Pager [phone]  Office [phone]   Tynleigh Birt D Lajuan Godbee 12/11/2019, 11:25 AM

## 2019-12-11 NOTE — Social Work (Signed)
Name: Becky Hendricks DOB: 11/19/1945  Please be advised that the above-named patient will require a short-term nursing home stay -- anticipated 30 days or less for rehabilitation and strengthening. The plan is for return home.

## 2019-12-11 NOTE — Progress Notes (Signed)
1 Day Post-Op   Subjective/Chief Complaint: comfortable   Objective: Vital signs in last 24 hours: Temp:  [97.3 F (36.3 C)-98.3 F (36.8 C)] 97.7 F (36.5 C) (04/30 0800) Pulse Rate:  [54-87] 72 (04/30 0800) Resp:  [16-30] 16 (04/30 0435) BP: (96-149)/(51-96) 120/70 (04/30 0800) SpO2:  [84 %-99 %] 95 % (04/30 0800) Weight:  [110 kg] 110 kg (04/29 1222) Last BM Date: 12/10/19  Intake/Output from previous day: 04/29 0701 - 04/30 0700 In: 290 [I.V.:40; IV Piggyback:250] Out: -  Intake/Output this shift: Total I/O In: -  Out: 450 [Urine:450]  Exam: Awake and alert Dressings dry and intact  Lab Results:  Recent Labs    12/09/19 0500 12/11/19 0500  WBC 11.1* 11.4*  HGB 9.6* 8.1*  HCT 32.2* 28.8*  PLT 358 311   BMET Recent Labs    12/10/19 0500 12/11/19 0500  NA 139 142  K 4.4 4.1  CL 93* 96*  CO2 38* 37*  GLUCOSE 162* 130*  BUN 9 11  CREATININE 0.38* 0.45  CALCIUM 8.1* 8.1*   PT/INR No results for input(s): LABPROT, INR in the last 72 hours. ABG No results for input(s): PHART, HCO3 in the last 72 hours.  Invalid input(s): PCO2, PO2  Studies/Results: No results found.  Anti-infectives: Anti-infectives (From admission, onward)   Start     Dose/Rate Route Frequency Ordered Stop   12/09/19 1400  Ampicillin-Sulbactam (UNASYN) 3 g in sodium chloride 0.9 % 100 mL IVPB     3 g 200 mL/hr over 30 Minutes Intravenous Every 6 hours 12/09/19 1107     12/05/19 1500  metroNIDAZOLE (FLAGYL) IVPB 500 mg  Status:  Discontinued     500 mg 100 mL/hr over 60 Minutes Intravenous Every 8 hours 12/05/19 1342 12/09/19 1107   12/04/19 1430  vancomycin (VANCOREADY) IVPB 1500 mg/300 mL     1,500 mg 150 mL/hr over 120 Minutes Intravenous Every 24 hours 12/03/19 1435     12/03/19 2130  ceFEPIme (MAXIPIME) 2 g in sodium chloride 0.9 % 100 mL IVPB  Status:  Discontinued     2 g 200 mL/hr over 30 Minutes Intravenous Every 8 hours 12/03/19 1435 12/09/19 1107   12/03/19 1330   ceFEPIme (MAXIPIME) 2 g in sodium chloride 0.9 % 100 mL IVPB     2 g 200 mL/hr over 30 Minutes Intravenous  Once 12/03/19 1307 12/03/19 1559   12/03/19 1315  metroNIDAZOLE (FLAGYL) IVPB 500 mg     500 mg 100 mL/hr over 60 Minutes Intravenous  Once 12/03/19 1307 12/03/19 1559   12/03/19 1315  vancomycin (VANCOCIN) IVPB 1000 mg/200 mL premix  Status:  Discontinued     1,000 mg 200 mL/hr over 60 Minutes Intravenous  Once 12/03/19 1307 12/03/19 1311   12/03/19 1315  vancomycin (VANCOCIN) 2,500 mg in sodium chloride 0.9 % 500 mL IVPB     2,500 mg 250 mL/hr over 120 Minutes Intravenous  Once 12/03/19 1311 12/03/19 1658      Assessment/Plan: s/p Procedure(s): REPEAT IRRIGATION AND DEBRIDEMENT BUTTOCK  ABSCESS (Left)  Continue wound care and IV antibiotics Surgery team will see again on Monday.  We will see as needed this weekend.   LOS: 8 days    Abigail Miyamoto MD 12/11/2019

## 2019-12-11 NOTE — Anesthesia Postprocedure Evaluation (Signed)
Anesthesia Post Note  Patient: Becky Hendricks  Procedure(s) Performed: REPEAT IRRIGATION AND DEBRIDEMENT BUTTOCK  ABSCESS (Left )     Patient location during evaluation: PACU Anesthesia Type: General Level of consciousness: sedated and patient cooperative Pain management: pain level controlled Vital Signs Assessment: post-procedure vital signs reviewed and stable Respiratory status: spontaneous breathing Cardiovascular status: stable Anesthetic complications: no    Last Vitals:  Vitals:   12/11/19 1600 12/11/19 1638  BP:  (!) 121/55  Pulse:  77  Resp: 20 20  Temp:  36.7 C  SpO2: 92% 95%    Last Pain:  Vitals:   12/11/19 1638  TempSrc: Oral  PainSc:                  Lewie Loron

## 2019-12-11 NOTE — Progress Notes (Signed)
PHARMACY CONSULT NOTE FOR:  OUTPATIENT  PARENTERAL ANTIBIOTIC THERAPY (OPAT)  Indication: Coccygeal osteo/sacral decubitus ulcer Regimen:Vancomycin IV 1500 mg every 24 hours+ Unasyn 3 gm IV every 6 hours  End date: 01/21/20  IV antibiotic discharge orders are pended. To discharging provider:  please sign these orders via discharge navigator,  Select New Orders & click on the button choice - Manage This Unsigned Work.     Thank you for allowing pharmacy to be a part of this patient's care.  Sharin Mons, PharmD, BCPS, BCIDP Infectious Diseases Clinical Pharmacist Phone: [phone] 12/11/2019, 9:15 AM

## 2019-12-11 NOTE — Progress Notes (Signed)
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  Systolics were in the 10C at the facility.  She has a known chronic sacral wound for which she has been receiving Rocephin for 1 week.  Her urine was noted to be foul-smelling and she has a history of recurrent UTIs.  In the ED she was found to have a temperature of 100.2.  She was tachycardic and tachypneic up to 40 with systolics in the 58N.  UA was not consistent with infection.  She was noted to have a very large deep sacral wound with purulent drainage.  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   Significant Events: 4/22 admit via [facility] from [facility] 4/23 I&D left buttock abscess in OR  Antimicrobials:  Cefepime 4/22 > Flagyl 4/22 > Vancomycin 4/22 >    HPI/Recap of past 24 hours:  She is confused, not a reliable historian, she does follow commands She denies pain, appear more comfortable than yesterday morning, blood pressure improved, tachypneic resolved She is on 2 L of oxygen, no hypoxia documented at rest, no cough,  Telemetry last 24 hours has been sinus rhythm    Assessment/Plan: Principal Problem:   Osteomyelitis ([place]) Active Problems:   Acute metabolic encephalopathy   Severe sepsis (HCC)   Abscess of multiple sites of buttock   AF (paroxysmal atrial fibrillation) ([place])   Hypoxia   Sepsis presented on admission with , left buttock abscess with  purulent drainage, stage IV pressure ulcer present on  admission -Status post I&D left buttock abscess on April 23 and again on 4/36f, last dose of Eliquis was given on April 26 in the morning around 10:30 AM, Resume Eliquis when okay with general surgery, -Wound culture grew RARE STAPHYLOCOCCUS WARNERI  -blood culture Streptococcus viridans only in one of the anaerobic bottle only is considered contamination -Echocardiogram no mention of vegation -Has been on vancomycin cefepime and Flagyl from admission to 4/28, now on vanc and unasyn per  ID recommendation -Per RN P[place]wick is not able to secure in place due to body habitus, stool is not loose enough to warrant rectal tube --plan per general surgery and ID   Possible HCAP CT scan abdomen raised the question of a possible right middle and lower lobe infiltrate - patient is not hypoxic on her baseline oxygen support -by report she has chronic respiratory failure on 2 L home oxygen broad antibiotic being used for abscess should provide adequate coverage  She appear tachypneic today, though no cough, no fever, will add mucinex, incentive spirometer,  repeat cxr no significant interval changes Will get speech eval, aspiration precaution  Acute metabolic encephalopathy versus chronic cognitive deficit Baseline mental status not clear but reportedly the patient is confused at baseline - B12, ammonia, and folic acid are all normal  -She is only oriented to self, not to time or place, she  following commands  Paroxysmal A. fib -change iv lopressor to higher dose of coreg due to elevated blood pressure  -Eliquis on hold for now as it appears patient  may require return to the OR  Hypertension Blood pressure elevated on 4/29 likely due to wound irritation, post I/d on 4/30, blood pressure now low normal, will d/c norvasc, decrease lisinopril, continue coreg   HLD Continue Lipitor  Insulin-dependent type 2 diabetes, uncontrolled with hyperglycemia A1c 7.8 Currently on Lantus 10 subcu daily and  SSI, other home meds Janumet Victoza held since admission, plan to resume at discharge A.m. 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   DVT Prophylaxis: Eliquis held, currently on SCD  Code Status: Full  Family Communication: patient   Disposition Plan:    Patient came from:            [facility]                                                                                               Anticipated d/c place:  Return to SNF  Barriers to d/c OR conditions which need to be met to effect a safe d/c:   ongoing  management for sacral wound, needs general surgery and infectious disease clearance  Consultants:  General surgery  Infectious disease  Procedures: Status post I&D left buttock abscess on April 23 and on 4/29  Antibiotics:  As above   Objective: BP 115/66   Pulse 71   Temp (!) 97.3 F (36.3 C) (Oral)   Resp 16   Ht _0  (1.676 m)   Wt 110 kg   LMP  (LMP Unknown)   SpO2 95%   BMI 39.14 kg/m   Intake/Output Summary (Last 24 hours) at 12/11/2019 2542 Last data filed at 12/10/2019 2039 Gross per 24 hour  Intake 290 ml  Output --  Net 290 ml   Filed Weights   12/03/19 1256 12/10/19 1222  Weight: 110 kg 110 kg    Exam: Patient is examined daily including today on 12/11/2019, exams remain the same as of yesterday except that has changed    General:  NAD, appears confused, but calm and cooperative, following  commands  Cardiovascular: RRR  Respiratory: Diminished at bases, no wheezing, no rales, no rhonchi  Abdomen: Soft/ND/NT, positive BS  Musculoskeletal: No Edema, on bilateral foot protectors  Neuro: alert, oriented to self only  Data Reviewed: Basic Metabolic Panel: Recent Labs  Lab 12/05/19 0519 12/06/19 0344 12/08/19 0355 12/09/19 0500 12/10/19 0500  NA 143 142 138 142 139  K 4.3 4.0 2.9* 4.2 4.4  CL 100 100 91* 97* 93*  CO2 37* 35* 38* 38* 38*  GLUCOSE 181* 106* 199* 173* 162*  BUN _1 CREATININE 0.41* 0.35* 0.54 0.37* 0.38*  CALCIUM 8.6* 8.4* 8.2* 8.2* 8.1*  MG 2.0  --  1.5* 1.8 1.7  PHOS 2.3*  --   --   --   --    Liver Function Tests: Recent Labs  Lab 12/05/19 0519 12/06/19 0344 12/08/19 0355  AST _0 ALT _1 ALKPHOS 51 51 85  BILITOT 0.5 0.5 0.3  PROT 5.6* 5.4* 5.6*  ALBUMIN 1.5* 1.4* 1.5*   No results for input(s): LIPASE, AMYLASE in the last 168 hours. Recent Labs  Lab 12/06/19 0344  AMMONIA 19   CBC: Recent Labs  Lab 12/05/19 0519 12/06/19 0344 12/07/19 0510 12/08/19 0355 12/09/19 0500  WBC 10.3 10.1 13.0* 10.7* 11.1*  NEUTROABS  --   --   --   --  7.1  HGB 8.5* 8.7* 10.2* 10.0* 9.6*  HCT 30.2* 30.6* 33.9* 33.4* 32.2*  MCV 97.1 97.5 90.9 90.3 93.1  PLT 317 310 350 360 358   Cardiac Enzymes:   No results for input(s): CKTOTAL, CKMB, CKMBINDEX, TROPONINI in the last 168 hours. BNP (last 3 results) Recent Labs    09/07/19 1950  BNP 37.6    ProBNP (last 3 results) No results for input(s): PROBNP in the last 8760 hours.  CBG: Recent Labs  Lab 12/09/19 2116 12/10/19 0846 12/10/19 1117 12/10/19 1319 12/10/19 2144  GLUCAP 184* 141* 142* 156* 196*    Recent Results (from the past 240 hour(s))  Blood Culture (routine x 2)     Status: Abnormal   Collection Time: 12/03/19  1:15 PM   Specimen: BLOOD  Result Value Ref Range Status   Specimen Description BLOOD RIGHT ANTECUBITAL  Final   Special Requests    Final    BOTTLES DRAWN AEROBIC AND ANAEROBIC Blood Culture adequate volume   Culture  Setup Time   Final    GRAM POSITIVE COCCI IN CHAINS ANAEROBIC BOTTLE ONLY CRITICAL RESULT CALLED TO, READ BACK BY AND VERIFIED WITH: Sharen Heck PharmD 10:15 12/04/19 ([email or web address])    Culture (A)  Final    VIRIDANS STREPTOCOCCUS THE SIGNIFICANCE OF ISOLATING THIS ORGANISM FROM A SINGLE SET OF BLOOD CULTURES WHEN MULTIPLE SETS ARE DRAWN IS UNCERTAIN. PLEASE NOTIFY THE MICROBIOLOGY DEPARTMENT WITHIN ONE WEEK IF SPECIATION AND SENSITIVITIES ARE REQUIRED. Performed at [facility], [place] [address] [place], [place] [address]    Report Status 12/06/2019 FINAL  Final  Blood Culture ID Panel (Reflexed)     Status: Abnormal   Collection Time: 12/03/19  1:15 PM  Result Value Ref Range Status   Enterococcus species NOT DETECTED NOT DETECTED Final   Listeria monocytogenes NOT DETECTED NOT DETECTED Final   Staphylococcus species NOT DETECTED NOT DETECTED Final   Staphylococcus aureus (BCID) NOT DETECTED NOT DETECTED Final   Streptococcus species DETECTED (A) NOT DETECTED Final    Comment: Not Enterococcus species, Streptococcus agalactiae, Streptococcus pyogenes, or Streptococcus pneumoniae. CRITICAL RESULT CALLED TO, READ BACK BY AND VERIFIED WITH: Sharen Heck PharmD 10:15 12/04/19 ([email or web address])    Streptococcus agalactiae NOT DETECTED NOT DETECTED Final   Streptococcus pneumoniae NOT DETECTED NOT DETECTED Final   Streptococcus pyogenes NOT DETECTED NOT DETECTED Final   Acinetobacter baumannii NOT DETECTED NOT DETECTED Final   Enterobacteriaceae species NOT DETECTED NOT DETECTED Final   Enterobacter cloacae complex NOT DETECTED NOT DETECTED Final   Escherichia coli NOT DETECTED NOT DETECTED Final   Klebsiella oxytoca NOT DETECTED NOT DETECTED Final   Klebsiella pneumoniae NOT DETECTED NOT DETECTED Final   Proteus species NOT  DETECTED NOT DETECTED Final   Serratia marcescens NOT DETECTED NOT DETECTED Final    Haemophilus influenzae NOT DETECTED NOT DETECTED Final   Neisseria meningitidis NOT DETECTED NOT DETECTED Final   Pseudomonas aeruginosa NOT DETECTED NOT DETECTED Final   Candida albicans NOT DETECTED NOT DETECTED Final   Candida glabrata NOT DETECTED NOT DETECTED Final   Candida krusei NOT DETECTED NOT DETECTED Final   Candida parapsilosis NOT DETECTED NOT DETECTED Final   Candida tropicalis NOT DETECTED NOT DETECTED Final    Comment: Performed at [facility], [place] [address] [place], [place] [address]  Blood Culture (routine x 2)     Status: None   Collection Time: 12/03/19  1:33 PM   Specimen: BLOOD LEFT WRIST  Result Value Ref Range Status   Specimen Description BLOOD LEFT WRIST  Final   Special Requests   Final    BOTTLES DRAWN AEROBIC ONLY Blood Culture results may not be optimal due to an inadequate volume of blood received in culture bottles   Culture   Final    NO GROWTH 5 DAYS Performed at [facility], [place] [address] [place], [place] [address]    Report Status 12/08/2019 FINAL  Final  Urine culture     Status: None   Collection Time: 12/03/19  5:20 PM   Specimen: In/Out Cath Urine  Result Value Ref Range Status   Specimen Description IN/OUT CATH URINE  Final   Special Requests NONE  Final   Culture   Final    NO GROWTH Performed at [facility], [place] [address], [place], [place] [address]    Report Status 12/05/2019 FINAL  Final  Respiratory Panel by RT PCR (Flu A&B, Covid) - Nasopharyngeal Swab     Status: None   Collection Time: 12/03/19 11:17 PM   Specimen: Nasopharyngeal Swab  Result Value Ref Range Status   SARS Coronavirus 2 by RT PCR NEGATIVE NEGATIVE Final    Comment: (NOTE) SARS-CoV-2 target nucleic acids are NOT DETECTED. The SARS-CoV-2 RNA is generally detectable in upper respiratoy specimens during the acute phase of infection. The lowest concentration of SARS-CoV-2 viral copies this assay can detect is 131 copies/mL. A negative  result does not preclude SARS-Cov-2 infection and should not be used as the sole basis for treatment or other patient management decisions. A negative result may occur with  improper specimen collection/handling, submission of specimen other than nasopharyngeal swab, presence of viral mutation(s) within the areas targeted by this assay, and inadequate number of viral copies (<131 copies/mL). A negative result must be combined with clinical observations, patient history, and epidemiological information. The expected result is Negative. Fact Sheet for Patients:  PinkCheek.be Fact Sheet for Healthcare Providers:  GravelBags.it This test is not yet ap proved or cleared by the [place] FDA and  has been authorized for detection and/or diagnosis of SARS-CoV-2 by FDA under an Emergency Use Authorization (EUA). This EUA will remain  in effect (meaning this test can be used) for the duration of the COVID-19 declaration under Section 564(b)(1) of the Act, 21 U.S.C. section 360bbb-3(b)(1), unless the authorization is terminated or revoked sooner.    Influenza A by PCR NEGATIVE NEGATIVE Final   Influenza B by PCR NEGATIVE NEGATIVE Final    Comment: (NOTE) The Xpert Xpress SARS-CoV-2/FLU/RSV assay is intended as an aid in  the diagnosis of influenza from Nasopharyngeal swab specimens and  should not be used as a sole basis for treatment. Nasal washings  and  aspirates are unacceptable for Xpert Xpress SARS-CoV-2/FLU/RSV  testing. Fact Sheet for Patients: PinkCheek.be Fact Sheet for Healthcare Providers: GravelBags.it This test is not yet approved or cleared by the [place] FDA and  has been authorized for detection and/or diagnosis of SARS-CoV-2 by  FDA under an Emergency Use Authorization (EUA). This EUA will remain  in effect (meaning this test can be used) for the  duration of the  Covid-19 declaration under Section 564(b)(1) of the Act, 21  U.S.C. section 360bbb-3(b)(1), unless the authorization is  terminated or revoked. Performed at [facility], [place] [address], [place], [place] [address]   MRSA PCR Screening     Status: Abnormal   Collection Time: 12/04/19  6:13 AM   Specimen: Nasal Mucosa; Nasopharyngeal  Result Value Ref Range Status   MRSA by PCR POSITIVE (A) NEGATIVE Final    Comment:        The GeneXpert MRSA Assay (FDA approved for NASAL specimens only), is one component of a comprehensive MRSA colonization surveillance program. It is not intended to diagnose MRSA infection nor to guide or monitor treatment for MRSA infections. RESULT CALLED TO, READ BACK BY AND VERIFIED WITH: Diannia Ruder RN 8:40 12/04/19 ([email or web address]) Performed at [facility], [place] [address] [place], [place] [address]   Aerobic/Anaerobic Culture (surgical/deep wound)     Status: None   Collection Time: 12/04/19 10:21 AM   Specimen: PATH Other; Tissue  Result Value Ref Range Status   Specimen Description TISSUE  Final   Special Requests LEFT BUTTOCK ABSCESS SPEC A  Final   Gram Stain   Final    RARE WBC PRESENT,BOTH PMN AND MONONUCLEAR NO ORGANISMS SEEN    Culture   Final    RARE STAPHYLOCOCCUS WARNERI RARE STREPTOCOCCUS ANGINOSIS CRITICAL RESULT CALLED TO, READ BACK BY AND VERIFIED WITH: RN M LESSARD [id number] AT 1025 AM BY CM FEW PREVOTELLA DENTICOLA FEW BACTEROIDES THETAIOTAOMICRON ORG 4 AND 5 BETA LACTAMASE POSITIVE Performed at [facility], [place] [address] [place], [place] [address]    Report Status 12/09/2019 FINAL  Final   Organism ID, Bacteria STAPHYLOCOCCUS WARNERI  Final   Organism ID, Bacteria STREPTOCOCCUS ANGINOSIS  Final      Susceptibility   Staphylococcus warneri - MIC*    CIPROFLOXACIN >=8 RESISTANT Resistant     ERYTHROMYCIN RESISTANT Resistant     GENTAMICIN <=0.5 SENSITIVE Sensitive     OXACILLIN >=4 RESISTANT  Resistant     TETRACYCLINE >=16 RESISTANT Resistant     VANCOMYCIN <=0.5 SENSITIVE Sensitive     TRIMETH/SULFA <=10 SENSITIVE Sensitive     CLINDAMYCIN RESISTANT Resistant     RIFAMPIN <=0.5 SENSITIVE Sensitive     Inducible Clindamycin POSITIVE Resistant     * RARE STAPHYLOCOCCUS WARNERI   Streptococcus anginosis - MIC*    PENICILLIN <=0.06 SENSITIVE Sensitive     CEFTRIAXONE 0.25 SENSITIVE Sensitive     ERYTHROMYCIN <=0.12 SENSITIVE Sensitive     LEVOFLOXACIN 0.5 SENSITIVE Sensitive     VANCOMYCIN 0.5 SENSITIVE Sensitive     * RARE STREPTOCOCCUS ANGINOSIS     Studies: No results found.  Scheduled Meds: . amLODipine  10 mg Oral Daily  . ascorbic acid  500 mg Oral BID  . atorvastatin  10 mg Oral Daily  . calcium carbonate  1 tablet Oral Daily  . carvedilol  25 mg Oral BID WC  . Chlorhexidine Gluconate Cloth  6 each Topical Q0600  . cholecalciferol  1,000  Units Oral Daily  . divalproex  125 mg Oral QHS  . divalproex  500 mg Oral Daily  . guaiFENesin  600 mg Oral BID  . insulin aspart  0-20 Units Subcutaneous TID WC  . insulin aspart  0-5 Units Subcutaneous QHS  . insulin glargine  10 Units Subcutaneous Daily  . lisinopril  20 mg Oral Daily  . mupirocin ointment  1 application Nasal BID  . senna-docusate  1 tablet Oral Daily  . sodium chloride flush  10-40 mL Intracatheter Q12H    Continuous Infusions: . ampicillin-sulbactam (UNASYN) IV 3 g (12/11/19 0323)  . vancomycin 1,500 mg (12/10/19 1028)     Time spent: 35 mins I have personally reviewed and interpreted on  12/11/2019 daily labs, tele strips, imagings as discussed above under date review session and assessment and plans.  I reviewed all nursing notes, pharmacy notes, consultant notes,  vitals, pertinent old records  I have discussed plan of care as described above with RN , patient  on 12/11/2019   Florencia Reasons MD, PhD, FACP  [facility]  Available via Epic secure chat 7am-7pm for nonurgent  issues Please page for urgent issues, pager number available through [email or web address] .   12/11/2019, 7:07 AM  LOS: 8 days

## 2019-12-12 LAB — CBC WITH DIFFERENTIAL/PLATELET
Abs Immature Granulocytes: 0.31 10*3/uL — ABNORMAL HIGH (ref 0.00–0.07)
Basophils Absolute: 0 10*3/uL (ref 0.0–0.1)
Basophils Relative: 0 %
Eosinophils Absolute: 0.2 10*3/uL (ref 0.0–0.5)
Eosinophils Relative: 1 %
HCT: 30.3 % — ABNORMAL LOW (ref 36.0–46.0)
Hemoglobin: 8.6 g/dL — ABNORMAL LOW (ref 12.0–15.0)
Immature Granulocytes: 3 %
Lymphocytes Relative: 19 %
Lymphs Abs: 2.2 10*3/uL (ref 0.7–4.0)
MCH: 27.6 pg (ref 26.0–34.0)
MCHC: 28.4 g/dL — ABNORMAL LOW (ref 30.0–36.0)
MCV: 97.1 fL (ref 80.0–100.0)
Monocytes Absolute: 1.3 10*3/uL — ABNORMAL HIGH (ref 0.1–1.0)
Monocytes Relative: 11 %
Neutro Abs: 7.6 10*3/uL (ref 1.7–7.7)
Neutrophils Relative %: 66 %
Platelets: 344 10*3/uL (ref 150–400)
RBC: 3.12 MIL/uL — ABNORMAL LOW (ref 3.87–5.11)
RDW: 17.2 % — ABNORMAL HIGH (ref 11.5–15.5)
WBC: 11.6 10*3/uL — ABNORMAL HIGH (ref 4.0–10.5)
nRBC: 0.2 % (ref 0.0–0.2)

## 2019-12-12 LAB — BASIC METABOLIC PANEL
Anion gap: 9 (ref 5–15)
BUN: 8 mg/dL (ref 8–23)
CO2: 38 mmol/L — ABNORMAL HIGH (ref 22–32)
Calcium: 8.4 mg/dL — ABNORMAL LOW (ref 8.9–10.3)
Chloride: 96 mmol/L — ABNORMAL LOW (ref 98–111)
Creatinine, Ser: 0.42 mg/dL — ABNORMAL LOW (ref 0.44–1.00)
GFR calc Af Amer: >60 mL/min (ref >60)
GFR calc non Af Amer: >60 mL/min (ref >60)
Glucose, Bld: 139 mg/dL — ABNORMAL HIGH (ref 70–99)
Potassium: 3.8 mmol/L (ref 3.5–5.1)
Sodium: 143 mmol/L (ref 135–145)

## 2019-12-12 LAB — MAGNESIUM: Magnesium: 1.9 mg/dL (ref 1.7–2.4)

## 2019-12-12 LAB — GLUCOSE, CAPILLARY
Glucose-Capillary: 117 mg/dL — ABNORMAL HIGH (ref 70–99)
Glucose-Capillary: 158 mg/dL — ABNORMAL HIGH (ref 70–99)
Glucose-Capillary: 92 mg/dL (ref 70–99)
Glucose-Capillary: 273 mg/dL — ABNORMAL HIGH (ref 70–99)

## 2019-12-12 MED ORDER — APIXABAN 5 MG PO TABS
5.0000 mg | ORAL_TABLET | Freq: Two times a day (BID) | ORAL | Status: DC
  Administered 2019-12-12 – 2019-12-17 (×11): 5 mg via ORAL
  Filled 2019-12-12 (×11): qty 1

## 2019-12-12 MED ORDER — LISINOPRIL 20 MG PO TABS
20.0000 mg | ORAL_TABLET | Freq: Every day | ORAL | Status: DC
  Administered 2019-12-13 – 2019-12-17 (×5): 20 mg via ORAL
  Filled 2019-12-12 (×5): qty 1

## 2019-12-12 NOTE — Evaluation (Signed)
Clinical/Bedside Swallow Evaluation Patient Details  Name: Becky Hendricks MRN: [id number] Date of Birth: 1946/02/18  Today's Date: 12/12/2019 Time: SLP Start Time (ACUTE ONLY): 0825 SLP Stop Time (ACUTE ONLY): 0848 SLP Time Calculation (min) (ACUTE ONLY): 23 min  Past Medical History:  Past Medical History:  Diagnosis Date  . Atrial fibrillation ([place])   . Bipolar disorder ([place])   . Clostridium difficile diarrhea 08/02/2019  . Dehydration   . Essential hypertension   . Morbid obesity ([place])   . Type 2 diabetes mellitus ([place])   . Weakness    Past Surgical History:  Past Surgical History:  Procedure Laterality Date  . INCISION AND DRAINAGE PERIRECTAL ABSCESS Left 12/04/2019   Procedure: IRRIGATION AND DEBRIDEMENT LEFT BUTTOCK ABSCESS;  Surgeon: Georganna Skeans, MD;  Location: [facility];  Service: General;  Laterality: Left;  . INCISION AND DRAINAGE PERIRECTAL ABSCESS Left 12/10/2019   Procedure: REPEAT IRRIGATION AND DEBRIDEMENT BUTTOCK  ABSCESS;  Surgeon: Coralie Keens, MD;  Location: [facility];  Service: General;  Laterality: Left;   HPI:  Ms Becky Hendricks, 73y/f, presented to ED from SNF with AMS and hypotenstion. S/P I&D buttocks abscess on 4/23. PMH A-fib, baseline confusion/bipolar disorder, HTN, morbid obesity, DM 2, 2L O2 baseline, Covid-19 (Jan 2021) and required intubation at that time. Seen by ST after extubation and pt tolerated Dys 3, thin diet and advanced to Reg, thin.    Assessment / Plan / Recommendation Clinical Impression  Clinincal swallow evaluation completed at bedside.  Nursing was in the room and reported pt had difficulty taking medications last evening. He was crushing her medication in puree. ORM exam was unremarkable. She could no elicit a cough or dry swallow, but her vocal quality was strong and clear. Pt tolerated ice chips, sips of cup, serial sips from straw and cracker (with complete mastication and transfer) with no s/s of aspiration. RN adminstererd puree with  meds crushed, pt tolerated 3 bites well with no s/s of aspiration, but refused to complete the last few bites for RN. She politely repeated "no thank you". "I don't need the medication because I don't have children". Pt exhibited no s/s of aspiration. Would recommend a regular diet with thin liquids. Medication crushed in puree if pt is agreeable. Medication whole in puree may be accepted better. No further services needed from ST at this time.  SLP Visit Diagnosis: Dysphagia, unspecified (R13.10)    Aspiration Risk  No limitations    Diet Recommendation Regular;Thin liquid   Liquid Administration via: Cup;Straw Medication Administration: Whole meds with puree    Other  Recommendations Oral Care Recommendations: Oral care BID   Follow up Recommendations Skilled Nursing facility        Swallow Study   General Date of Onset: 12/03/19 HPI: Ms Becky Hendricks, 59Q/I, presented to ED from SNF with AMS and hypotenstion. S/P I&D buttocks abscess on 4/23. PMH A-fib, baseline confusion/bipolar disorder, HTN, morbid obesity, DM 2, 2L O2 baseline, Covid-19 (Jan 2021) and required intubation at that time. Seen by ST after extubation and pt tolerated Dys 3, thin diet and advanced to Reg, thin.  Type of Study: Bedside Swallow Evaluation Previous Swallow Assessment: 08/2019 Diet Prior to this Study: Dysphagia 3 (soft);Thin liquids Temperature Spikes Noted: No Respiratory Status: Nasal cannula History of Recent Intubation: No Behavior/Cognition: Alert;Confused Oral Cavity Assessment: Within Functional Limits Oral Care Completed by SLP: Recent completion by staff Oral Cavity - Dentition: Adequate natural dentition Vision: Functional for self-feeding Self-Feeding Abilities: Able to feed self Patient  Positioning: Upright in bed Baseline Vocal Quality: Normal Volitional Cough: Cognitively unable to elicit Volitional Swallow: Unable to elicit    Oral/Motor/Sensory Function Overall Oral Motor/Sensory  Function: Within functional limits   Ice Chips Ice chips: Within functional limits Presentation: Spoon   Thin Liquid Thin Liquid: Within functional limits Presentation: Cup;Straw    Nectar Thick Nectar Thick Liquid: Not tested   Honey Thick Honey Thick Liquid: Not tested   Puree Puree: Within functional limits Presentation: Spoon   Solid     Solid: Within functional limits Presentation: Self Becky Hendricks., MA, CCC-SLP 12/12/2019,10:28 AM

## 2019-12-12 NOTE — Progress Notes (Signed)
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  Systolics were in the 79K at the facility.  She has a known chronic sacral wound for which she has been receiving Rocephin for 1 week.  Her urine was noted to be foul-smelling and she has a history of recurrent UTIs.  In the ED she was found to have a temperature of 100.2.  She was tachycardic and tachypneic up to 40 with systolics in the 24O.  UA was not consistent with infection.  She was noted to have a very large deep sacral wound with purulent drainage.  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   Significant Events: 4/22 admit via [facility] from [facility] 4/23 I&D left buttock abscess in OR  Antimicrobials:  Cefepime 4/22 > Flagyl 4/22 > Vancomycin 4/22 >    HPI/Recap of past 24 hours:  She is confused, not a reliable historian, she does follow commands  She is on 2 L of oxygen, no hypoxia documented at rest, no cough, no fever    Assessment/Plan: Principal Problem:   Osteomyelitis ([place]) Active Problems:   Acute metabolic encephalopathy   Severe sepsis (HCC)   Abscess of multiple sites of buttock   AF (paroxysmal atrial fibrillation) ([place])   Hypoxia   Sacral wound   Sepsis presented on admission with , left buttock abscess with  purulent drainage, stage IV pressure ulcer present on admission -Status post I&D left buttock abscess on April 23 and again on 4/29, no plan for further surgical intervention, will resume  elliquis, f/u with wound care  Dr. Dellia Nims  -Wound culture grew RARE STAPHYLOCOCCUS WARNERI  -blood culture Streptococcus viridans only in one of the anaerobic bottle only is considered contamination -Echocardiogram no mention of vegation --Per RN Puriwick is not able to secure in place due to body habitus, stool is not loose enough to warrant rectal tube --Has been on vancomycin cefepime and Flagyl from admission to 4/28, now on vanc and unasyn, plan for 6 weeks of abx with vanc ans unasyn for 6weeks,, end date on 01/21/2020, f/u with ID in two weeks. Monitor stool texture due to h/o cdiff, low threshold to start oral Vanco  per  ID recommendation   Possible HCAP CT scan abdomen raised the question of a possible right middle and lower lobe infiltrate - patient is not hypoxic on her baseline oxygen support -by report she has chronic respiratory failure on 2 L home oxygen broad antibiotic being used for abscess should provide adequate coverage   no cough, no fever, continue  mucinex, incentive spirometer,  repeat cxr no significant interval changes, tachypnea has improved after second wound I/D Will get speech eval, aspiration precaution  Acute metabolic encephalopathy versus chronic cognitive deficit Baseline mental status not clear but reportedly the patient is confused at baseline - B12, ammonia, and folic acid are all normal  -She is only oriented to self, not to time or place, she  following commands  Paroxysmal A. fib -change iv lopressor to higher dose of coreg due to elevated  blood pressure  -Eliquis held briefly due to I/d of the wound, resumed on 5/1  Hypertension Continue adjust bp meds to achieved normal blood pressure Currently on  Coreg and lisinopril   HLD Continue Lipitor  Insulin-dependent type 2 diabetes, uncontrolled with hyperglycemia A1c 7.8 Currently on Lantus 10 subcu daily and SSI, other home meds Janumet Victoza held since admission, plan to resume at  discharge A.m. glucose 139   CKD 2/anemia of chronic disease due to CKD 2 Creatinine at baseline, hemoglobin 10, no sign of bleeding  Hypokalemia/hypomagnesemia/hypophosphatemia Monitor, replace as needed  QTC prolongation, keep on telemetry, follow-up EKG, keep K above 4 mag above 2 QTC 554 on presentation on April 22, repeat EKG QTC 456 on April 26 Continue monitor  Bipolar disorder Prolonged QT noted on EKG therefore quetiapine on hold -continue Depakote  MRSA screen + Continue empiric vancomycin given extensive sacral wound  Obesity Body mass index is 39.14 kg/m.   DVT Prophylaxis: Eliquis held, currently on SCD  Code Status: Full  Family Communication: patient   Disposition Plan:    Patient came from:            [facility]                                                                                               Anticipated d/c place:  Return to SNF  Barriers to d/c OR conditions which need to be met to effect a safe d/c:  awaiting for [facility] then snf placement  Consultants:  General surgery  Infectious disease  Procedures: Status post I&D left buttock abscess on April 23 and on 4/29  Antibiotics:  As above   Objective: BP (!) 158/79 (BP Location: Right Arm)   Pulse 80   Temp 97.6 F (36.4 C) (Oral)   Resp (!) 25   Ht '5\' 6"'  (1.676 m)   Wt 110 kg   LMP  (LMP Unknown)   SpO2 96%   BMI 39.14 kg/m   Intake/Output Summary (Last 24 hours) at 12/12/2019 0838 Last data filed at 12/11/2019 2150 Gross per 24 hour  Intake 10 ml  Output 350 ml  Net -340 ml   Filed Weights   12/03/19 1256 12/10/19 1222  Weight: 110 kg 110 kg    Exam: Patient is examined daily including today on 12/12/2019, exams remain the same as of yesterday except that has changed    General:  NAD, appears confused, but calm and cooperative, following commands  Cardiovascular: RRR  Respiratory: Diminished at bases, no wheezing, no rales, no rhonchi  Abdomen:  Soft/ND/NT, positive BS  Musculoskeletal: No Edema, on bilateral foot protectors  Neuro: alert, oriented to self only  Data Reviewed: Basic Metabolic Panel: Recent Labs  Lab 12/08/19 0355 12/09/19 0500 12/10/19 0500 12/11/19 0500 12/12/19 0418  NA 138 142 139 142 143  K 2.9* 4.2 4.4 4.1 3.8  CL 91* 97* 93* 96* 96*  CO2 38* 38* 38* 37* 38*  GLUCOSE 199* 173* 162* 130* 139*  BUN '11 8 9 11 8  ' CREATININE 0.54 0.37* 0.38* 0.45 0.42*  CALCIUM 8.2* 8.2*  8.1* 8.1* 8.4*  MG 1.5* 1.8 1.7 1.9 1.9  PHOS  --   --   --  4.4  --    Liver Function Tests: Recent Labs  Lab 12/06/19 0344 12/08/19 0355  AST 16 16  ALT 19 18  ALKPHOS 51 85  BILITOT 0.5 0.3  PROT 5.4* 5.6*  ALBUMIN 1.4* 1.5*   No results for input(s): LIPASE, AMYLASE in the last 168 hours. Recent Labs  Lab 12/06/19 0344  AMMONIA 19   CBC: Recent Labs  Lab 12/07/19 0510 12/08/19 0355 12/09/19 0500 12/11/19 0500 12/12/19 0418  WBC 13.0* 10.7* 11.1* 11.4* 11.6*  NEUTROABS  --   --  7.1 7.8* 7.6  HGB 10.2* 10.0* 9.6* 8.1* 8.6*  HCT 33.9* 33.4* 32.2* 28.8* 30.3*  MCV 90.9 90.3 93.1 98.6 97.1  PLT 350 360 358 311 344   Cardiac Enzymes:   No results for input(s): CKTOTAL, CKMB, CKMBINDEX, TROPONINI in the last 168 hours. BNP (last 3 results) Recent Labs    09/07/19 1950  BNP 37.6    ProBNP (last 3 results) No results for input(s): PROBNP in the last 8760 hours.  CBG: Recent Labs  Lab 12/11/19 0806 12/11/19 1155 12/11/19 1601 12/11/19 2106 12/12/19 0732  GLUCAP 134* 194* 161* 101* 117*    Recent Results (from the past 240 hour(s))  Blood Culture (routine x 2)     Status: Abnormal   Collection Time: 12/03/19  1:15 PM   Specimen: BLOOD  Result Value Ref Range Status   Specimen Description BLOOD RIGHT ANTECUBITAL  Final   Special Requests   Final    BOTTLES DRAWN AEROBIC AND ANAEROBIC Blood Culture adequate volume   Culture  Setup Time   Final    GRAM POSITIVE COCCI IN CHAINS ANAEROBIC  BOTTLE ONLY CRITICAL RESULT CALLED TO, READ BACK BY AND VERIFIED WITH: Sharen Heck PharmD 10:15 12/04/19 ([email or web address])    Culture (A)  Final    VIRIDANS STREPTOCOCCUS THE SIGNIFICANCE OF ISOLATING THIS ORGANISM FROM A SINGLE SET OF BLOOD CULTURES WHEN MULTIPLE SETS ARE DRAWN IS UNCERTAIN. PLEASE NOTIFY THE MICROBIOLOGY DEPARTMENT WITHIN ONE WEEK IF SPECIATION AND SENSITIVITIES ARE REQUIRED. Performed at [facility], [place] [address] [place], [place] [address]    Report Status 12/06/2019 FINAL  Final  Blood Culture ID Panel (Reflexed)     Status: Abnormal   Collection Time: 12/03/19  1:15 PM  Result Value Ref Range Status   Enterococcus species NOT DETECTED NOT DETECTED Final   Listeria monocytogenes NOT DETECTED NOT DETECTED Final   Staphylococcus species NOT DETECTED NOT DETECTED Final   Staphylococcus aureus (BCID) NOT DETECTED NOT DETECTED Final   Streptococcus species DETECTED (A) NOT DETECTED Final    Comment: Not Enterococcus species, Streptococcus agalactiae, Streptococcus pyogenes, or Streptococcus pneumoniae. CRITICAL RESULT CALLED TO, READ BACK BY AND VERIFIED WITH: Sharen Heck PharmD 10:15 12/04/19 ([email or web address])    Streptococcus agalactiae NOT DETECTED NOT DETECTED Final   Streptococcus pneumoniae NOT DETECTED NOT DETECTED Final   Streptococcus pyogenes NOT DETECTED NOT DETECTED Final   Acinetobacter baumannii NOT DETECTED NOT DETECTED Final   Enterobacteriaceae species NOT DETECTED NOT DETECTED Final   Enterobacter cloacae complex NOT DETECTED NOT DETECTED Final   Escherichia coli NOT DETECTED NOT DETECTED Final   Klebsiella oxytoca NOT DETECTED NOT DETECTED Final   Klebsiella pneumoniae NOT DETECTED NOT DETECTED Final   Proteus species NOT DETECTED NOT DETECTED Final   Serratia marcescens NOT DETECTED NOT DETECTED Final   Haemophilus influenzae  NOT DETECTED NOT DETECTED Final   Neisseria meningitidis NOT DETECTED NOT DETECTED Final   Pseudomonas aeruginosa NOT DETECTED  NOT DETECTED Final   Candida albicans NOT DETECTED NOT DETECTED Final   Candida glabrata NOT DETECTED NOT DETECTED Final   Candida krusei NOT DETECTED NOT DETECTED Final   Candida parapsilosis NOT DETECTED NOT DETECTED Final   Candida tropicalis NOT DETECTED NOT DETECTED Final    Comment: Performed at [facility], [place] [address], [place], [place] [address]  Blood Culture (routine x 2)     Status: None   Collection Time: 12/03/19  1:33 PM   Specimen: BLOOD LEFT WRIST  Result Value Ref Range Status   Specimen Description BLOOD LEFT WRIST  Final   Special Requests   Final    BOTTLES DRAWN AEROBIC ONLY Blood Culture results may not be optimal due to an inadequate volume of blood received in culture bottles   Culture   Final    NO GROWTH 5 DAYS Performed at [facility], [place] [address], [place], [place] [address]    Report Status 12/08/2019 FINAL  Final  Urine culture     Status: None   Collection Time: 12/03/19  5:20 PM   Specimen: In/Out Cath Urine  Result Value Ref Range Status   Specimen Description IN/OUT CATH URINE  Final   Special Requests NONE  Final   Culture   Final    NO GROWTH Performed at [facility], [place] [address], [place], [place] [address]    Report Status 12/05/2019 FINAL  Final  Respiratory Panel by RT PCR (Flu A&B, Covid) - Nasopharyngeal Swab     Status: None   Collection Time: 12/03/19 11:17 PM   Specimen: Nasopharyngeal Swab  Result Value Ref Range Status   SARS Coronavirus 2 by RT PCR NEGATIVE NEGATIVE Final    Comment: (NOTE) SARS-CoV-2 target nucleic acids are NOT DETECTED. The SARS-CoV-2 RNA is generally detectable in upper respiratoy specimens during the acute phase of infection. The lowest concentration of SARS-CoV-2 viral copies this assay can detect is 131 copies/mL. A negative result does not preclude SARS-Cov-2 infection and should not be used as the sole basis for treatment or other patient management decisions. A negative  result may occur with  improper specimen collection/handling, submission of specimen other than nasopharyngeal swab, presence of viral mutation(s) within the areas targeted by this assay, and inadequate number of viral copies (<131 copies/mL). A negative result must be combined with clinical observations, patient history, and epidemiological information. The expected result is Negative. Fact Sheet for Patients:  PinkCheek.be Fact Sheet for Healthcare Providers:  GravelBags.it This test is not yet ap proved or cleared by the [place] FDA and  has been authorized for detection and/or diagnosis of SARS-CoV-2 by FDA under an Emergency Use Authorization (EUA). This EUA will remain  in effect (meaning this test can be used) for the duration of the COVID-19 declaration under Section 564(b)(1) of the Act, 21 U.S.C. section 360bbb-3(b)(1), unless the authorization is terminated or revoked sooner.    Influenza A by PCR NEGATIVE NEGATIVE Final   Influenza B by PCR NEGATIVE NEGATIVE Final    Comment: (NOTE) The Xpert Xpress SARS-CoV-2/FLU/RSV assay is intended as an aid in  the diagnosis of influenza from Nasopharyngeal swab specimens and  should not be used as a sole basis for treatment. Nasal washings and  aspirates are unacceptable for Xpert Xpress SARS-CoV-2/FLU/RSV  testing. Fact Sheet for Patients: PinkCheek.be Fact  Sheet for Healthcare Providers: GravelBags.it This test is not yet approved or cleared by the [place] and  has been authorized for detection and/or diagnosis of SARS-CoV-2 by  FDA under an Emergency Use Authorization (EUA). This EUA will remain  in effect (meaning this test can be used) for the duration of the  Covid-19 declaration under Section 564(b)(1) of the Act, 21  U.S.C. section 360bbb-3(b)(1), unless the authorization is  terminated or  revoked. Performed at [facility], [place] [address], [place], [place] [address]   MRSA PCR Screening     Status: Abnormal   Collection Time: 12/04/19  6:13 AM   Specimen: Nasal Mucosa; Nasopharyngeal  Result Value Ref Range Status   MRSA by PCR POSITIVE (A) NEGATIVE Final    Comment:        The GeneXpert MRSA Assay (FDA approved for NASAL specimens only), is one component of a comprehensive MRSA colonization surveillance program. It is not intended to diagnose MRSA infection nor to guide or monitor treatment for MRSA infections. RESULT CALLED TO, READ BACK BY AND VERIFIED WITH: Diannia Ruder RN 8:40 12/04/19 ([email or web address]) Performed at [facility], [place] [address], [place], [place] [address]   Aerobic/Anaerobic Culture (surgical/deep wound)     Status: None   Collection Time: 12/04/19 10:21 AM   Specimen: PATH Other; Tissue  Result Value Ref Range Status   Specimen Description TISSUE  Final   Special Requests LEFT BUTTOCK ABSCESS SPEC A  Final   Gram Stain   Final    RARE WBC PRESENT,BOTH PMN AND MONONUCLEAR NO ORGANISMS SEEN    Culture   Final    RARE STAPHYLOCOCCUS WARNERI RARE STREPTOCOCCUS ANGINOSIS CRITICAL RESULT CALLED TO, READ BACK BY AND VERIFIED WITH: RN M LESSARD [phone] AT 1025 AM BY CM FEW PREVOTELLA DENTICOLA FEW BACTEROIDES THETAIOTAOMICRON ORG 4 AND 5 BETA LACTAMASE POSITIVE Performed at [facility], [place] [address], [place], [place] [address]    Report Status 12/09/2019 FINAL  Final   Organism ID, Bacteria STAPHYLOCOCCUS WARNERI  Final   Organism ID, Bacteria STREPTOCOCCUS ANGINOSIS  Final      Susceptibility   Staphylococcus warneri - MIC*    CIPROFLOXACIN >=8 RESISTANT Resistant     ERYTHROMYCIN RESISTANT Resistant     GENTAMICIN <=0.5 SENSITIVE Sensitive     OXACILLIN >=4 RESISTANT Resistant     TETRACYCLINE >=16 RESISTANT Resistant     VANCOMYCIN <=0.5 SENSITIVE Sensitive     TRIMETH/SULFA <=10 SENSITIVE Sensitive     CLINDAMYCIN  RESISTANT Resistant     RIFAMPIN <=0.5 SENSITIVE Sensitive     Inducible Clindamycin POSITIVE Resistant     * RARE STAPHYLOCOCCUS WARNERI   Streptococcus anginosis - MIC*    PENICILLIN <=0.06 SENSITIVE Sensitive     CEFTRIAXONE 0.25 SENSITIVE Sensitive     ERYTHROMYCIN <=0.12 SENSITIVE Sensitive     LEVOFLOXACIN 0.5 SENSITIVE Sensitive     VANCOMYCIN 0.5 SENSITIVE Sensitive     * RARE STREPTOCOCCUS ANGINOSIS     Studies: No results found.  Scheduled Meds: . ascorbic acid  500 mg Oral BID  . atorvastatin  10 mg Oral Daily  . calcium carbonate  1 tablet Oral Daily  . carvedilol  25 mg Oral BID WC  . cholecalciferol  1,000 Units Oral Daily  . divalproex  125 mg Oral QHS  . divalproex  500 mg Oral Daily  . guaiFENesin  600 mg Oral BID  . insulin aspart  0-20 Units Subcutaneous  TID WC  . insulin aspart  0-5 Units Subcutaneous QHS  . insulin glargine  10 Units Subcutaneous Daily  . lisinopril  10 mg Oral Daily  . senna-docusate  1 tablet Oral Daily  . sodium chloride flush  10-40 mL Intracatheter Q12H    Continuous Infusions: . ampicillin-sulbactam (UNASYN) IV 3 g (12/12/19 0823)  . vancomycin 1,500 mg (12/11/19 1028)     Time spent: 35 mins I have personally reviewed and interpreted on  12/12/2019 daily labs, tele strips, imagings as discussed above under date review session and assessment and plans.  I reviewed all nursing notes, pharmacy notes, consultant notes,  vitals, pertinent old records  I have discussed plan of care as described above with RN , patient  on 12/12/2019   Florencia Reasons MD, PhD, FACP  [facility]  Available via Epic secure chat 7am-7pm for nonurgent issues Please page for urgent issues, pager number available through [email or web address] .   12/12/2019, 8:38 AM  LOS: 9 days

## 2019-12-13 DIAGNOSIS — R0902 Hypoxemia: Secondary | ICD-10-CM

## 2019-12-13 LAB — CBC
HCT: 30.1 % — ABNORMAL LOW (ref 36.0–46.0)
Hemoglobin: 8.4 g/dL — ABNORMAL LOW (ref 12.0–15.0)
MCH: 27.1 pg (ref 26.0–34.0)
MCHC: 27.9 g/dL — ABNORMAL LOW (ref 30.0–36.0)
MCV: 97.1 fL (ref 80.0–100.0)
Platelets: 344 10*3/uL (ref 150–400)
RBC: 3.1 MIL/uL — ABNORMAL LOW (ref 3.87–5.11)
RDW: 17.5 % — ABNORMAL HIGH (ref 11.5–15.5)
WBC: 11.3 10*3/uL — ABNORMAL HIGH (ref 4.0–10.5)
nRBC: 0.2 % (ref 0.0–0.2)

## 2019-12-13 LAB — BASIC METABOLIC PANEL
Anion gap: 9 (ref 5–15)
BUN: 5 mg/dL — ABNORMAL LOW (ref 8–23)
CO2: 39 mmol/L — ABNORMAL HIGH (ref 22–32)
Calcium: 8.4 mg/dL — ABNORMAL LOW (ref 8.9–10.3)
Chloride: 94 mmol/L — ABNORMAL LOW (ref 98–111)
Creatinine, Ser: 0.36 mg/dL — ABNORMAL LOW (ref 0.44–1.00)
GFR calc Af Amer: >60 mL/min (ref >60)
GFR calc non Af Amer: >60 mL/min (ref >60)
Glucose, Bld: 167 mg/dL — ABNORMAL HIGH (ref 70–99)
Potassium: 3.8 mmol/L (ref 3.5–5.1)
Sodium: 142 mmol/L (ref 135–145)

## 2019-12-13 LAB — GLUCOSE, CAPILLARY
Glucose-Capillary: 179 mg/dL — ABNORMAL HIGH (ref 70–99)
Glucose-Capillary: 127 mg/dL — ABNORMAL HIGH (ref 70–99)
Glucose-Capillary: 225 mg/dL — ABNORMAL HIGH (ref 70–99)
Glucose-Capillary: 145 mg/dL — ABNORMAL HIGH (ref 70–99)

## 2019-12-13 LAB — VANCOMYCIN, TROUGH: Vancomycin Tr: 8 ug/mL — ABNORMAL LOW (ref 15–20)

## 2019-12-13 LAB — MAGNESIUM: Magnesium: 1.9 mg/dL (ref 1.7–2.4)

## 2019-12-13 MED ORDER — HYDRALAZINE HCL 20 MG/ML IJ SOLN: 10.0000 mg | Freq: Once | INTRAMUSCULAR | Status: DC

## 2019-12-13 MED ORDER — VANCOMYCIN HCL IN DEXTROSE 1-5 GM/200ML-% IV SOLN
1000.0000 mg | Freq: Two times a day (BID) | INTRAVENOUS | Status: DC
  Administered 2019-12-13 – 2019-12-17 (×8): 1000 mg via INTRAVENOUS
  Filled 2019-12-13 (×10): qty 200

## 2019-12-13 NOTE — Progress Notes (Signed)
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  Systolics were in the 70s at the facility.  She has a known chronic sacral wound for which she has been receiving Rocephin for 1 week.  Her urine was noted to be foul-smelling and she has a history of recurrent UTIs.  In the ED she was found to have a temperature of 100.2.  She was tachycardic and tachypneic up to 40 with systolics in the 90s.  UA was not consistent with infection.  She was noted to have a very large deep sacral wound with purulent drainage.  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     HPI/Recap of past 24 hours:  She is confused, not a reliable historian, she does follow commands  She is on 2 L of oxygen, no hypoxia documented at rest, no cough, no fever She appear more comfortable today    Assessment/Plan: Principal Problem:   Osteomyelitis (HCC) Active Problems:   Acute metabolic encephalopathy   Severe sepsis (HCC)   Abscess of multiple sites of buttock   AF (paroxysmal atrial fibrillation) (HCC)   Hypoxia   Sacral wound   Sepsis presented on admission with left buttock abscess with purulent drainage, stage IV pressure ulcer present on admission -Status post I&D left buttock abscess on April 23 and again on 4/29, no plan for further surgical intervention, will resume elliquis, f/u with wound care  Dr. Leanord Hawking  -Wound culture grew RARE STAPHYLOCOCCUS WARNERI and strep anginosis -blood culture Streptococcus  viridans only in one of the anaerobic bottle only is considered contamination -Echocardiogram no mention of vegation --Per RN Puriwick is not able to secure in place due to body habitus, stool is not loose enough to warrant rectal tube --Has been on vancomycin cefepime and Flagyl from admission to 4/28, now on vanc and unasyn, plan for 6 weeks of abx with vanc and unasyn for 6weeks,, end date on 01/21/2020, f/u with ID in two weeks. Monitor stool texture due to h/o cdiff, low threshold to start oral Vanco  per  ID recommendation She currently has a single lumen midline in left arm that was placed on 4/25   Possible HCAP CT scan abdomen raised the question of a possible right middle and lower lobe infiltrate - patient is not hypoxic on her baseline oxygen support -by report she has chronic respiratory failure on 2 L home oxygen broad antibiotic being used for abscess should provide adequate coverage   no cough, no fever, continue  mucinex, incentive spirometer,  repeat cxr no significant interval changes, tachypnea has improved after second wound I/D She did well with speech eval, aspiration precaution  Acute metabolic encephalopathy versus chronic cognitive deficit Baseline mental status not clear but reportedly the patient is confused at baseline - B12, ammonia, and folic acid are all normal  -She is only oriented to self, not to time or place, she  following commands  Paroxysmal A. fib -change iv lopressor to higher dose of coreg due to elevated blood pressure  -Eliquis held briefly due  to I/d of the wound, resumed on 5/1  Hypertension Continue adjust bp meds to achieved normal blood pressure Currently on  Coreg and lisinopril   HLD Continue Lipitor  Insulin-dependent type 2 diabetes, uncontrolled with hyperglycemia A1c 7.8 Currently on Lantus 10 subcu daily and SSI, other home meds Janumet Victoza held since admission, plan to resume at discharge A.m. 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   COVID-19 infection January 2021    DVT Prophylaxis: Eliquis held, currently on SCD  Code Status: Full  Family Communication: patient   Disposition Plan:    Patient came from:            [facility]                                                                                               Anticipated d/c place:  Return to SNF  Barriers to d/c OR conditions which need to be met to effect a safe d/c:  awaiting for [facility] then snf placement  Consultants:  General surgery  Infectious disease  Procedures: Status post I&D left buttock abscess on April 23 and on 4/29  Antibiotics:  As above   Objective: BP (!) 126/93 (BP Location: Right Arm)   Pulse 88   Temp 98.1 F (36.7 C) (Axillary)   Resp 20   Ht 5\' 6"  (1.676 m)   Wt 110 kg   LMP  (LMP Unknown)   SpO2 94%   BMI 39.14 kg/m   Intake/Output Summary (Last 24 hours) at 12/13/2019 0818 Last data filed at 12/13/2019 0530 Gross per 24 hour  Intake 10 ml  Output 350 ml  Net -340 ml   Filed Weights   12/03/19 1256 12/10/19 1222  Weight: 110 kg 110 kg    Exam: Patient is examined daily including today on 12/13/2019, exams remain the same as of yesterday except that has changed    General:  NAD, appears confused, but calm and cooperative, following commands  Cardiovascular: RRR  Respiratory: Diminished at bases, no wheezing, no rales, no rhonchi  Abdomen: Soft/ND/NT,  positive BS  Musculoskeletal: No Edema, on bilateral foot protectors  Neuro: alert, oriented to self only  Data Reviewed: Basic Metabolic Panel: Recent Labs  Lab 12/09/19 0500 12/10/19 0500 12/11/19 0500 12/12/19 0418 12/13/19 0500  NA 142 139 142 143 142  K 4.2 4.4 4.1 3.8 3.8  CL 97* 93* 96* 96* 94*  CO2 38* 38* 37* 38* 39*  GLUCOSE 173* 162* 130* 139* 167*  BUN 8 9 11 8  5*  CREATININE 0.37* 0.38* 0.45 0.42* 0.36*  CALCIUM 8.2* 8.1* 8.1*  8.4* 8.4*  MG 1.8 1.7 1.9 1.9 1.9  PHOS  --   --  4.4  --   --    Liver Function Tests: Recent Labs  Lab 12/08/19 0355  AST 16  ALT 18  ALKPHOS 85  BILITOT 0.3  PROT 5.6*  ALBUMIN 1.5*   No results for input(s): LIPASE, AMYLASE in the last 168 hours. No results for input(s): AMMONIA in the last 168 hours. CBC: Recent Labs  Lab 12/08/19 0355 12/09/19 0500 12/11/19 0500 12/12/19 0418 12/13/19 0500  WBC 10.7* 11.1* 11.4* 11.6* 11.3*  NEUTROABS  --  7.1 7.8* 7.6  --   HGB 10.0* 9.6* 8.1* 8.6* 8.4*  HCT 33.4* 32.2* 28.8* 30.3* 30.1*  MCV 90.3 93.1 98.6 97.1 97.1  PLT 360 358 311 344 344   Cardiac Enzymes:   No results for input(s): CKTOTAL, CKMB, CKMBINDEX, TROPONINI in the last 168 hours. BNP (last 3 results) Recent Labs    09/07/19 1950  BNP 37.6    ProBNP (last 3 results) No results for input(s): PROBNP in the last 8760 hours.  CBG: Recent Labs  Lab 12/12/19 0732 12/12/19 1204 12/12/19 1633 12/12/19 2122 12/13/19 0730  GLUCAP 117* 158* 92 273* 179*    Recent Results (from the past 240 hour(s))  Blood Culture (routine x 2)     Status: Abnormal   Collection Time: 12/03/19  1:15 PM   Specimen: BLOOD  Result Value Ref Range Status   Specimen Description BLOOD RIGHT ANTECUBITAL  Final   Special Requests   Final    BOTTLES DRAWN AEROBIC AND ANAEROBIC Blood Culture adequate volume   Culture  Setup Time   Final    GRAM POSITIVE COCCI IN CHAINS ANAEROBIC BOTTLE ONLY CRITICAL RESULT CALLED TO, READ BACK BY  AND VERIFIED WITH: Gala Lewandowsky PharmD 10:15 12/04/19 ([email or web address])    Culture (A)  Final    VIRIDANS STREPTOCOCCUS THE SIGNIFICANCE OF ISOLATING THIS ORGANISM FROM A SINGLE SET OF BLOOD CULTURES WHEN MULTIPLE SETS ARE DRAWN IS UNCERTAIN. PLEASE NOTIFY THE MICROBIOLOGY DEPARTMENT WITHIN ONE WEEK IF SPECIATION AND SENSITIVITIES ARE REQUIRED. Performed at [facility], [address], [place], [place] [address]    Report Status 12/06/2019 FINAL  Final  Blood Culture ID Panel (Reflexed)     Status: Abnormal   Collection Time: 12/03/19  1:15 PM  Result Value Ref Range Status   Enterococcus species NOT DETECTED NOT DETECTED Final   Listeria monocytogenes NOT DETECTED NOT DETECTED Final   Staphylococcus species NOT DETECTED NOT DETECTED Final   Staphylococcus aureus (BCID) NOT DETECTED NOT DETECTED Final   Streptococcus species DETECTED (A) NOT DETECTED Final    Comment: Not Enterococcus species, Streptococcus agalactiae, Streptococcus pyogenes, or Streptococcus pneumoniae. CRITICAL RESULT CALLED TO, READ BACK BY AND VERIFIED WITH: Gala Lewandowsky PharmD 10:15 12/04/19 ([email or web address])    Streptococcus agalactiae NOT DETECTED NOT DETECTED Final   Streptococcus pneumoniae NOT DETECTED NOT DETECTED Final   Streptococcus pyogenes NOT DETECTED NOT DETECTED Final   Acinetobacter baumannii NOT DETECTED NOT DETECTED Final   Enterobacteriaceae species NOT DETECTED NOT DETECTED Final   Enterobacter cloacae complex NOT DETECTED NOT DETECTED Final   Escherichia coli NOT DETECTED NOT DETECTED Final   Klebsiella oxytoca NOT DETECTED NOT DETECTED Final   Klebsiella pneumoniae NOT DETECTED NOT DETECTED Final   Proteus species NOT DETECTED NOT DETECTED Final   Serratia marcescens NOT DETECTED NOT DETECTED Final   Haemophilus influenzae NOT DETECTED NOT DETECTED Final   Neisseria meningitidis NOT  DETECTED NOT DETECTED Final   Pseudomonas aeruginosa NOT DETECTED NOT DETECTED Final   Candida albicans NOT DETECTED  NOT DETECTED Final   Candida glabrata NOT DETECTED NOT DETECTED Final   Candida krusei NOT DETECTED NOT DETECTED Final   Candida parapsilosis NOT DETECTED NOT DETECTED Final   Candida tropicalis NOT DETECTED NOT DETECTED Final    Comment: Performed at [facility], [address], [place], [place] [address]  Blood Culture (routine x 2)     Status: None   Collection Time: 12/03/19  1:33 PM   Specimen: BLOOD LEFT WRIST  Result Value Ref Range Status   Specimen Description BLOOD LEFT WRIST  Final   Special Requests   Final    BOTTLES DRAWN AEROBIC ONLY Blood Culture results may not be optimal due to an inadequate volume of blood received in culture bottles   Culture   Final    NO GROWTH 5 DAYS Performed at [facility], [address], [place], [place] [address]    Report Status 12/08/2019 FINAL  Final  Urine culture     Status: None   Collection Time: 12/03/19  5:20 PM   Specimen: In/Out Cath Urine  Result Value Ref Range Status   Specimen Description IN/OUT CATH URINE  Final   Special Requests NONE  Final   Culture   Final    NO GROWTH Performed at [facility], [address], [place], [place] [address]    Report Status 12/05/2019 FINAL  Final  Respiratory Panel by RT PCR (Flu A&B, Covid) - Nasopharyngeal Swab     Status: None   Collection Time: 12/03/19 11:17 PM   Specimen: Nasopharyngeal Swab  Result Value Ref Range Status   SARS Coronavirus 2 by RT PCR NEGATIVE NEGATIVE Final    Comment: (NOTE) SARS-CoV-2 target nucleic acids are NOT DETECTED. The SARS-CoV-2 RNA is generally detectable in upper respiratoy specimens during the acute phase of infection. The lowest concentration of SARS-CoV-2 viral copies this assay can detect is 131 copies/mL. A negative result does not preclude SARS-Cov-2 infection and should not be used as the sole basis for treatment or other patient management decisions. A negative result may occur with  improper specimen  collection/handling, submission of specimen other than nasopharyngeal swab, presence of viral mutation(s) within the areas targeted by this assay, and inadequate number of viral copies (<131 copies/mL). A negative result must be combined with clinical observations, patient history, and epidemiological information. The expected result is Negative. Fact Sheet for Patients:  https://www.moore.com/ Fact Sheet for Healthcare Providers:  https://www.young.biz/ This test is not yet ap proved or cleared by the [place] FDA and  has been authorized for detection and/or diagnosis of SARS-CoV-2 by FDA under an Emergency Use Authorization (EUA). This EUA will remain  in effect (meaning this test can be used) for the duration of the COVID-19 declaration under Section 564(b)(1) of the Act, 21 U.S.C. section 360bbb-3(b)(1), unless the authorization is terminated or revoked sooner.    Influenza A by PCR NEGATIVE NEGATIVE Final   Influenza B by PCR NEGATIVE NEGATIVE Final    Comment: (NOTE) The Xpert Xpress SARS-CoV-2/FLU/RSV assay is intended as an aid in  the diagnosis of influenza from Nasopharyngeal swab specimens and  should not be used as a sole basis for treatment. Nasal washings and  aspirates are unacceptable for Xpert Xpress SARS-CoV-2/FLU/RSV  testing. Fact Sheet for Patients: https://www.moore.com/ Fact Sheet for Healthcare Providers: https://www.young.biz/ This test is not yet  approved or cleared by the [place] and  has been authorized for detection and/or diagnosis of SARS-CoV-2 by  FDA under an Emergency Use Authorization (EUA). This EUA will remain  in effect (meaning this test can be used) for the duration of the  Covid-19 declaration under Section 564(b)(1) of the Act, 21  U.S.C. section 360bbb-3(b)(1), unless the authorization is  terminated or revoked. Performed at [facility], [address], [place], [place] [address]   MRSA PCR Screening     Status: Abnormal   Collection Time: 12/04/19  6:13 AM   Specimen: Nasal Mucosa; Nasopharyngeal  Result Value Ref Range Status   MRSA by PCR POSITIVE (A) NEGATIVE Final    Comment:        The GeneXpert MRSA Assay (FDA approved for NASAL specimens only), is one component of a comprehensive MRSA colonization surveillance program. It is not intended to diagnose MRSA infection nor to guide or monitor treatment for MRSA infections. RESULT CALLED TO, READ BACK BY AND VERIFIED WITH: Truman Hayward RN 8:40 12/04/19 ([email or web address]) Performed at [facility], [address] [place], [place] [address]   Aerobic/Anaerobic Culture (surgical/deep wound)     Status: None   Collection Time: 12/04/19 10:21 AM   Specimen: PATH Other; Tissue  Result Value Ref Range Status   Specimen Description TISSUE  Final   Special Requests LEFT BUTTOCK ABSCESS SPEC A  Final   Gram Stain   Final    RARE WBC PRESENT,BOTH PMN AND MONONUCLEAR NO ORGANISMS SEEN    Culture   Final    RARE STAPHYLOCOCCUS WARNERI RARE STREPTOCOCCUS ANGINOSIS CRITICAL RESULT CALLED TO, READ BACK BY AND VERIFIED WITH: RN M LESSARD [id number] AT 1025 AM BY CM FEW PREVOTELLA DENTICOLA FEW BACTEROIDES THETAIOTAOMICRON ORG 4 AND 5 BETA LACTAMASE POSITIVE Performed at [facility], [address] [place], [place] [address]    Report Status 12/09/2019 FINAL  Final   Organism ID, Bacteria STAPHYLOCOCCUS WARNERI  Final   Organism ID, Bacteria STREPTOCOCCUS ANGINOSIS  Final      Susceptibility   Staphylococcus warneri - MIC*    CIPROFLOXACIN >=8 RESISTANT Resistant     ERYTHROMYCIN RESISTANT Resistant     GENTAMICIN <=0.5 SENSITIVE Sensitive     OXACILLIN >=4 RESISTANT Resistant     TETRACYCLINE >=16 RESISTANT Resistant     VANCOMYCIN <=0.5 SENSITIVE Sensitive     TRIMETH/SULFA <=10 SENSITIVE Sensitive     CLINDAMYCIN RESISTANT Resistant     RIFAMPIN <=0.5  SENSITIVE Sensitive     Inducible Clindamycin POSITIVE Resistant     * RARE STAPHYLOCOCCUS WARNERI   Streptococcus anginosis - MIC*    PENICILLIN <=0.06 SENSITIVE Sensitive     CEFTRIAXONE 0.25 SENSITIVE Sensitive     ERYTHROMYCIN <=0.12 SENSITIVE Sensitive     LEVOFLOXACIN 0.5 SENSITIVE Sensitive     VANCOMYCIN 0.5 SENSITIVE Sensitive     * RARE STREPTOCOCCUS ANGINOSIS     Studies: No results found.  Scheduled Meds: . apixaban  5 mg Oral BID  . ascorbic acid  500 mg Oral BID  . atorvastatin  10 mg Oral Daily  . calcium carbonate  1 tablet Oral Daily  . carvedilol  25 mg Oral BID WC  . cholecalciferol  1,000 Units Oral Daily  . divalproex  125 mg Oral QHS  . divalproex  500 mg Oral Daily  . guaiFENesin  600 mg Oral BID  . hydrALAZINE  10 mg Intravenous Once  .  insulin aspart  0-20 Units Subcutaneous TID WC  . insulin aspart  0-5 Units Subcutaneous QHS  . insulin glargine  10 Units Subcutaneous Daily  . lisinopril  20 mg Oral Daily  . senna-docusate  1 tablet Oral Daily  . sodium chloride flush  10-40 mL Intracatheter Q12H    Continuous Infusions: . ampicillin-sulbactam (UNASYN) IV 3 g (12/13/19 0807)  . vancomycin 1,500 mg (12/12/19 1002)     Time spent: 25 mins I have personally reviewed and interpreted on  12/13/2019 daily labs, tele strips, imagings as discussed above under date review session and assessment and plans.  I reviewed all nursing notes, pharmacy notes, consultant notes,  vitals, pertinent old records  I have discussed plan of care as described above with RN , patient  on 12/13/2019   Albertine Grates MD, PhD, FACP  [facility]  Available via Epic secure chat 7am-7pm for nonurgent issues Please page for urgent issues, pager number available through [email or web address] .   12/13/2019, 8:18 AM  LOS: 10 days

## 2019-12-13 NOTE — Progress Notes (Signed)
Notified Dr Rana Snare of elevated B/P and that prn Labetolol was given as ordered.

## 2019-12-13 NOTE — Progress Notes (Signed)
Pharmacy Antibiotic Note  Becky Hendricks is a 74 y.o. female admitted on 12/03/2019 with sepsis.  Patient has purulent sacral wound and was taking CTX for 1 week prior. Has hx of recurrent UTI. Pharmacy has been consulted for Vancomycin dosing. Cefepime and flagyl transitioned to Unasyn. S/p I&D of the sacral wound (last on 4/29).   WBC 11, afebrile. Scr 0.36 (stable, has varied from 0.3-0.5). Vancomycin trough today came back subtherapeutic at 8, on vancomycin 1500 mg IV every 24 hours. We will use vanc trough for monitoring - goal 15-20; had previously been therapeutic on 4/25.   Plan: Adjust vancomycin to 1000 mg IV Q12 hrs - would get trough at steady state Unasyn 3g IV q6h Monitor clinical progress, c/s, renal function F/u de-escalation plan/LOT, vancomycin levels as indicated   Height: 5\' 6"  (167.6 cm) Weight: 110 kg (242 lb 8.1 oz) IBW/kg (Calculated) : 59.3  Temp (24hrs), Avg:98.4 F (36.9 C), Min:97.8 F (36.6 C), Max:99.1 F (37.3 C)  Recent Labs  Lab 12/08/19 0355 12/08/19 0355 12/09/19 0500 12/10/19 0500 12/11/19 0500 12/12/19 0418 12/13/19 0500 12/13/19 0935  WBC 10.7*  --  11.1*  --  11.4* 11.6* 11.3*  --   CREATININE 0.54   < > 0.37* 0.38* 0.45 0.42* 0.36*  --   VANCOTROUGH  --   --   --   --   --   --   --  8*   < > = values in this interval not displayed.    Estimated Creatinine Clearance: 81.1 mL/min (A) (by C-G formula based on SCr of 0.36 mg/dL (L)).    Allergies  Allergen Reactions  . Chlorhexidine Gluconate     itching    Antimicrobials this admission: Vanc 4/22 >> Flagyl 4/22 x1; 4/24 >>4/28 Cefepime 4/22 >>4/28 Unasyn 4/28 >>  Dose adjustments this admission: 4/25 VT>>15 - continue 5/2 VT 8 - adjust to 1g IV every 12 hours   Microbiology results: 4/22 BC: 1/3 viridan strep 4/22 UCx >>neg 4/23 L-buttock abscess: rare staphylococcus warneri, strep anginosis 4/23 MRSA pcr +  5/23, PharmD, BCCCP Clinical Pharmacist  Phone:  [phone] 12/13/2019 10:52 AM  Please check AMION for all [facility] phone numbers After 10:00 PM, call Main Pharmacy [phone]

## 2019-12-14 ENCOUNTER — Inpatient Hospital Stay (HOSPITAL_COMMUNITY): Payer: Medicare Other

## 2019-12-14 LAB — GLUCOSE, CAPILLARY
Glucose-Capillary: 181 mg/dL — ABNORMAL HIGH (ref 70–99)
Glucose-Capillary: 173 mg/dL — ABNORMAL HIGH (ref 70–99)
Glucose-Capillary: 123 mg/dL — ABNORMAL HIGH (ref 70–99)
Glucose-Capillary: 125 mg/dL — ABNORMAL HIGH (ref 70–99)

## 2019-12-14 IMAGING — DX DG CHEST 1V PORT
1 series · 1 of 1 positions shown · non-contrast
Comparison: Radiographs [DATE] and [DATE]. Abdominal CT
[DATE].

CLINICAL DATA: Tachypnea. Evaluate for pneumonia.

EXAM:
PORTABLE CHEST 1 VIEW

[chest ap]
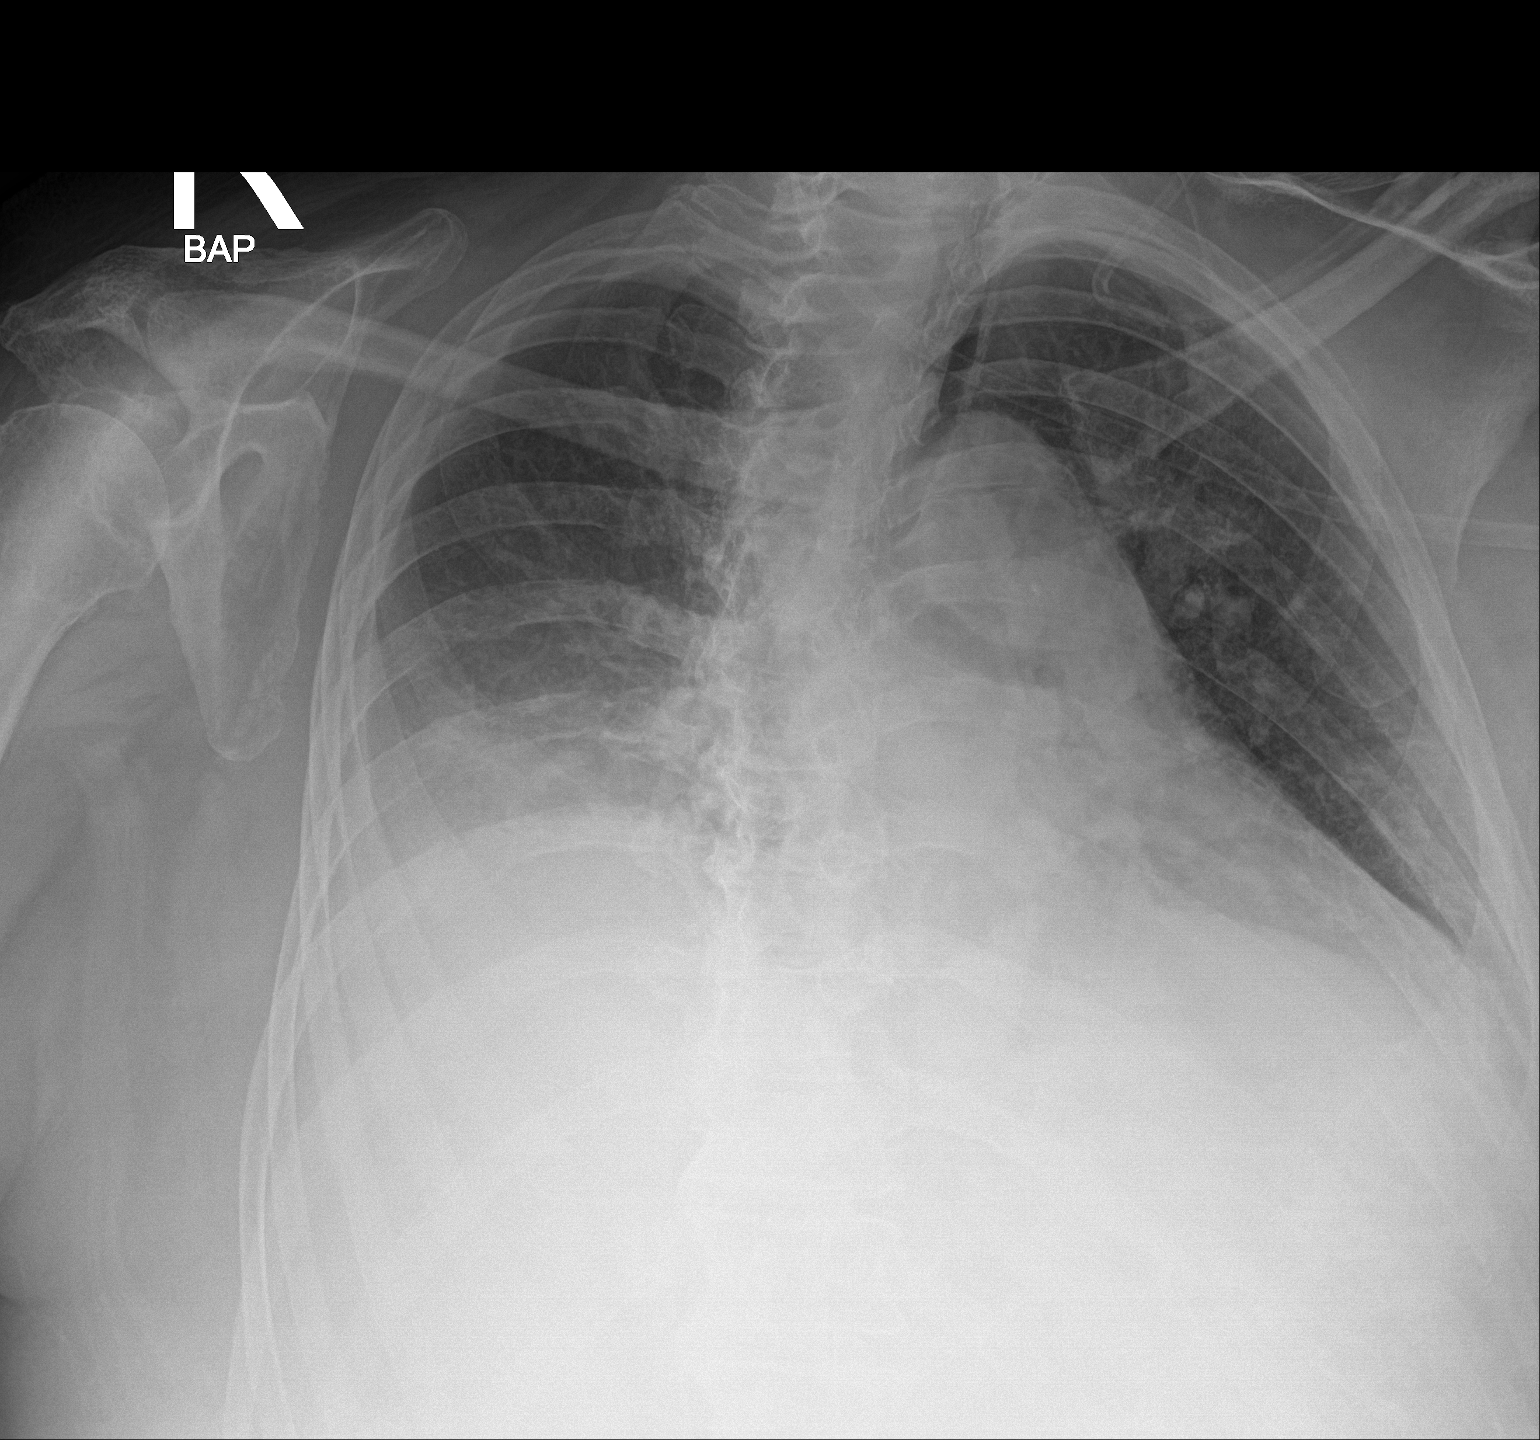

[1 of 1 positions shown; findings below may reference images not displayed]

FINDINGS: [7R] hours. Increased patient rotation to the left. The heart size
and mediastinal contours are stable. There is persistent elevation
of the right hemidiaphragm with associated right basilar airspace
disease and a possible small right pleural effusion. Mild
atelectasis is present at the left lung base. No edema or
pneumothorax. The bones appear unchanged.
IMPRESSION: Persistent right basilar infiltrate versus atelectasis with possible
small right pleural effusion.

## 2019-12-14 MED ORDER — SODIUM CHLORIDE 0.9 % IV SOLN: INTRAVENOUS | Status: DC

## 2019-12-14 NOTE — Discharge Instructions (Signed)
Information on my medicine - ELIQUIS (apixaban)  This medication education was reviewed with me or my healthcare representative as part of my discharge preparation.  The pharmacist that spoke with me during my hospital stay was:  Ulyses Southward, RPH-CPP  Why was Eliquis prescribed for you? Eliquis was prescribed for you to reduce the risk of a blood clot forming that can cause a stroke if you have a medical condition called atrial fibrillation (a type of irregular heartbeat).  What do You need to know about Eliquis ? Take your Eliquis TWICE DAILY - one tablet in the morning and one tablet in the evening with or without food. If you have difficulty swallowing the tablet whole please discuss with your pharmacist how to take the medication safely.  Take Eliquis exactly as prescribed by your doctor and DO NOT stop taking Eliquis without talking to the doctor who prescribed the medication.  Stopping may increase your risk of developing a stroke.  Refill your prescription before you run out.  After discharge, you should have regular check-up appointments with your healthcare provider that is prescribing your Eliquis.  In the future your dose may need to be changed if your kidney function or weight changes by a significant amount or as you get older.  What do you do if you miss a dose? If you miss a dose, take it as soon as you remember on the same day and resume taking twice daily.  Do not take more than one dose of ELIQUIS at the same time to make up a missed dose.  Important Safety Information A possible side effect of Eliquis is bleeding. You should call your healthcare provider right away if you experience any of the following: ? Bleeding from an injury or your nose that does not stop. ? Unusual colored urine (red or dark brown) or unusual colored stools (red or black). ? Unusual bruising for unknown reasons. ? A serious fall or if you hit your head (even if there is no bleeding).  Some  medicines may interact with Eliquis and might increase your risk of bleeding or clotting while on Eliquis. To help avoid this, consult your healthcare provider or pharmacist prior to using any new prescription or non-prescription medications, including herbals, vitamins, non-steroidal anti-inflammatory drugs (NSAIDs) and supplements.  This website has more information on Eliquis (apixaban): http://www.eliquis.com/eliquis/home  Normal saline wet to dry dressing change to all buttock/sacral wounds twice a day

## 2019-12-14 NOTE — Progress Notes (Signed)
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  Systolics were in the 70s at the facility.  She has a known chronic sacral wound for which she has been receiving Rocephin for 1 week.  Her urine was noted to be foul-smelling and she has a history of recurrent UTIs.  In the ED she was found to have a temperature of 100.2.  She was tachycardic and tachypneic up to 40 with systolics in the 90s.  UA was not consistent with infection.  She was noted to have a very large deep sacral wound with purulent drainage.  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  Significant Events: 4/22 admit via [place] from [facility] 4/23 I&D left buttock abscess in OR  Antimicrobials:  Cefepime 4/22 > Flagyl 4/22 > Vancomycin 4/22 >  Subjective: Sedate / lethargic today, as has been a recurring issue for this patient. Does not appear uncomfortable or in distress. Is however tachypneic. CXR without new findings today. Monitor for now. If lethargy persists will consider ABG.   Assessment & Plan:  Severe sepsis POA - buttock abscess/osteomyelitis of the coccyx General surgery has been following - now status post I&D 4/23 and again 4/29- continue empiric broad antibiotic coverage as per ID recs - surgical cultures grew Staph warneri and Strep anginosis - sespsis physiology essentially resolved - plan for 6 weeks of antibiotic with Vanco and Zosyn end date 01/21/2020 with follow-up in ID clinic  Streptococcus viridans bacteremia Felt to  most likely represent a contaminant - TTE without concerning findings  Possible HCAP CT scan abdomen raised the question of a possible right middle and lower lobe infiltrate -patient is not hypoxic on her baseline oxygen support -broad antibiotic being used for abscess should provide adequate coverage -suspect this is likely simply atelectasis -no clinical evidence of a true pneumonia at this time  Acute metabolic encephalopathy versus chronic cognitive deficit Baseline mental status not clear but reportedly the patient is confused at baseline - B12, ammonia, and folic acid are all normal -mental status continues to wax and wane th/o this hospital stay   Paroxysmal atrial fibrillation Eliquis resumed now that the patient no longer expected to require OR - cont coreg  Normocytic anemia Likely due to chronic disease/smoldering infection - hemoglobin stable for now  Bipolar disorder Prolonged QT noted on EKG therefore quetiapine on hold -continue Depakote  HTN Hypotensive at presentation - BP reasonably controlled at present, and no longer low   Morbid obesity - Body mass index is 39.14 kg/m.  HLD Continue Lipitor  DM 2 -uncontrolled with hyperglycemia A1c 7.8 reflecting poor control in outpatient setting -CBG reasonably controlled   QT prolongation Noted on admission EKG in ED -continue telemetry monitoring -discontinued quetiapine -follow-up EKG notes QT remains slightly prolonged  MRSA screen + Continue empiric vancomycin given extensive sacral wound  DVT prophylaxis: Eliquis  Code Status: FULL CODE Family Communication: No family present at time of exam Disposition Plan: Patient lives in SNF and will need to return to this environment when medically stable -  hospitalization will likely be prolonged with the need for recurrent debridements and antibiotic management  Consultants:  General surgery  Objective: Blood pressure (!) 152/75, pulse 66, temperature 97.7 F (36.5 C),  temperature source Axillary, resp. rate 14, height 5\' 6"  (1.676 m), weight 110 kg, SpO2 100 %.  Intake/Output Summary (Last 24 hours) at 12/14/2019 0925 Last data filed at 12/14/2019 0409 Gross per 24 hour  Intake 1910 ml  Output 400 ml  Net 1510 ml   Filed Weights   12/03/19 1256 12/10/19 1222  Weight: 110 kg 110 kg    Examination: General: sedated  Lungs: tachypneic but in no distress - no crackles - no wheezing  Cardiovascular: Irregularly irregular with controlled HR Abdomen: Nondistended, soft, BS positive Extremities: Trace edema B LE which is stable  CBC: Recent Labs  Lab 12/09/19 0500 12/09/19 0500 12/11/19 0500 12/12/19 0418 12/13/19 0500  WBC 11.1*   < > 11.4* 11.6* 11.3*  NEUTROABS 7.1  --  7.8* 7.6  --   HGB 9.6*   < > 8.1* 8.6* 8.4*  HCT 32.2*   < > 28.8* 30.3* 30.1*  MCV 93.1   < > 98.6 97.1 97.1  PLT 358   < > 311 344 344   < > = values in this interval not displayed.   Basic Metabolic Panel: Recent Labs  Lab 12/11/19 0500 12/12/19 0418 12/13/19 0500  NA 142 143 142  K 4.1 3.8 3.8  CL 96* 96* 94*  CO2 37* 38* 39*  GLUCOSE 130* 139* 167*  BUN 11 8 5*  CREATININE 0.45 0.42* 0.36*  CALCIUM 8.1* 8.4* 8.4*  MG 1.9 1.9 1.9  PHOS 4.4  --   --    GFR: Estimated Creatinine Clearance: 81.1 mL/min (A) (by C-G formula based on SCr of 0.36 mg/dL (L)).  Liver Function Tests: Recent Labs  Lab 12/08/19 0355  AST 16  ALT 18  ALKPHOS 85  BILITOT 0.3  PROT 5.6*  ALBUMIN 1.5*    Coagulation Profile: No results for input(s): INR, PROTIME in the last 168 hours.  HbA1C: Hgb A1c MFr Bld  Date/Time Value Ref Range Status  12/03/2019 11:20 PM 7.8 (H) 4.8 - 5.6 % Final    Comment:    (NOTE) Pre diabetes:          5.7%-6.4% Diabetes:              >6.4% Glycemic control for   <7.0% adults with diabetes   09/07/2019 07:50 PM 9.5 (H) 4.8 - 5.6 % Final    Comment:    (NOTE) Pre diabetes:          5.7%-6.4% Diabetes:              >6.4% Glycemic  control for   <7.0% adults with diabetes     CBG: Recent Labs  Lab 12/13/19 0730 12/13/19 1153 12/13/19 1634 12/13/19 2045 12/14/19 0817  GLUCAP 179* 127* 225* 145* 181*    Recent Results (from the past 240 hour(s))  Aerobic/Anaerobic Culture (surgical/deep wound)     Status: None   Collection Time: 12/04/19 10:21 AM   Specimen: PATH Other; Tissue  Result Value Ref Range Status   Specimen Description TISSUE  Final   Special Requests LEFT BUTTOCK ABSCESS SPEC A  Final   Gram Stain   Final    RARE WBC PRESENT,BOTH PMN AND MONONUCLEAR NO ORGANISMS SEEN    Culture   Final    RARE STAPHYLOCOCCUS WARNERI RARE STREPTOCOCCUS ANGINOSIS CRITICAL  RESULT CALLED TO, READ BACK BY AND VERIFIED WITH: RN M LESSARD [id number] AT 1025 AM BY CM FEW PREVOTELLA DENTICOLA FEW BACTEROIDES THETAIOTAOMICRON ORG 4 AND 5 BETA LACTAMASE POSITIVE Performed at [facility], [address], [place], [place] [address]    Report Status 12/09/2019 FINAL  Final   Organism ID, Bacteria STAPHYLOCOCCUS WARNERI  Final   Organism ID, Bacteria STREPTOCOCCUS ANGINOSIS  Final      Susceptibility   Staphylococcus warneri - MIC*    CIPROFLOXACIN >=8 RESISTANT Resistant     ERYTHROMYCIN RESISTANT Resistant     GENTAMICIN <=0.5 SENSITIVE Sensitive     OXACILLIN >=4 RESISTANT Resistant     TETRACYCLINE >=16 RESISTANT Resistant     VANCOMYCIN <=0.5 SENSITIVE Sensitive     TRIMETH/SULFA <=10 SENSITIVE Sensitive     CLINDAMYCIN RESISTANT Resistant     RIFAMPIN <=0.5 SENSITIVE Sensitive     Inducible Clindamycin POSITIVE Resistant     * RARE STAPHYLOCOCCUS WARNERI   Streptococcus anginosis - MIC*    PENICILLIN <=0.06 SENSITIVE Sensitive     CEFTRIAXONE 0.25 SENSITIVE Sensitive     ERYTHROMYCIN <=0.12 SENSITIVE Sensitive     LEVOFLOXACIN 0.5 SENSITIVE Sensitive     VANCOMYCIN 0.5 SENSITIVE Sensitive     * RARE STREPTOCOCCUS ANGINOSIS     Scheduled Meds: . apixaban  5 mg Oral BID  . ascorbic acid  500 mg  Oral BID  . atorvastatin  10 mg Oral Daily  . calcium carbonate  1 tablet Oral Daily  . carvedilol  25 mg Oral BID WC  . cholecalciferol  1,000 Units Oral Daily  . divalproex  125 mg Oral QHS  . divalproex  500 mg Oral Daily  . guaiFENesin  600 mg Oral BID  . hydrALAZINE  10 mg Intravenous Once  . insulin aspart  0-20 Units Subcutaneous TID WC  . insulin aspart  0-5 Units Subcutaneous QHS  . insulin glargine  10 Units Subcutaneous Daily  . lisinopril  20 mg Oral Daily  . senna-docusate  1 tablet Oral Daily  . sodium chloride flush  10-40 mL Intracatheter Q12H   Continuous Infusions: . ampicillin-sulbactam (UNASYN) IV 3 g (12/14/19 0740)  . vancomycin 1,000 mg (12/13/19 2243)     LOS: 11 days   Cherene Altes, MD [facility] Office  [phone] Pager - Text Page per Amion  If 7PM-7AM, please contact night-coverage per Amion 12/14/2019, 9:25 AM

## 2019-12-14 NOTE — Progress Notes (Signed)
Patient ID: Becky Hendricks, female   DOB: Oct 05, 1945, 74 y.o.   MRN: [id number]    4 Days Post-Op  Subjective: Patient is obtunded and does not respond to me.  ROS: unable  Objective: Vital signs in last 24 hours: Temp:  [97.6 F (36.4 C)-98.3 F (36.8 C)] 97.7 F (36.5 C) (05/03 0812) Pulse Rate:  [57-73] 66 (05/03 0812) Resp:  [14-33] 14 (05/03 0812) BP: (114-160)/(59-102) 152/75 (05/03 0812) SpO2:  [98 %-100 %] 100 % (05/03 0408) Last BM Date: 12/11/19  Intake/Output from previous day: 05/02 0701 - 05/03 0700 In: 1910 [P.O.:100; I.V.:10; IV Piggyback:1800] Out: 400 [Urine:400] Intake/Output this shift: No intake/output data recorded.  PE: Skin: wounds are all clean, small amount of fibrin over larger mid wound around the edges.  All other wounds are clean.  Lab Results:  Recent Labs    12/12/19 0418 12/13/19 0500  WBC 11.6* 11.3*  HGB 8.6* 8.4*  HCT 30.3* 30.1*  PLT 344 344   BMET Recent Labs    12/12/19 0418 12/13/19 0500  NA 143 142  K 3.8 3.8  CL 96* 94*  CO2 38* 39*  GLUCOSE 139* 167*  BUN 8 5*  CREATININE 0.42* 0.36*  CALCIUM 8.4* 8.4*   PT/INR No results for input(s): LABPROT, INR in the last 72 hours. CMP     Component Value Date/Time   NA 142 12/13/2019 0500   K 3.8 12/13/2019 0500   CL 94 (L) 12/13/2019 0500   CO2 39 (H) 12/13/2019 0500   GLUCOSE 167 (H) 12/13/2019 0500   BUN 5 (L) 12/13/2019 0500   CREATININE 0.36 (L) 12/13/2019 0500   CALCIUM 8.4 (L) 12/13/2019 0500   PROT 5.6 (L) 12/08/2019 0355   ALBUMIN 1.5 (L) 12/08/2019 0355   AST 16 12/08/2019 0355   ALT 18 12/08/2019 0355   ALKPHOS 85 12/08/2019 0355   BILITOT 0.3 12/08/2019 0355   GFRNONAA >60 12/13/2019 0500   GFRAA >60 12/13/2019 0500   Lipase  No results found for: LIPASE     Studies/Results: No results found.  Anti-infectives: Anti-infectives (From admission, onward)   Start     Dose/Rate Route Frequency Ordered Stop   12/13/19 2200  vancomycin (VANCOCIN)  IVPB 1000 mg/200 mL premix     1,000 mg 200 mL/hr over 60 Minutes Intravenous Every 12 hours 12/13/19 1057     12/09/19 1400  Ampicillin-Sulbactam (UNASYN) 3 g in sodium chloride 0.9 % 100 mL IVPB     3 g 200 mL/hr over 30 Minutes Intravenous Every 6 hours 12/09/19 1107     12/05/19 1500  metroNIDAZOLE (FLAGYL) IVPB 500 mg  Status:  Discontinued     500 mg 100 mL/hr over 60 Minutes Intravenous Every 8 hours 12/05/19 1342 12/09/19 1107   12/04/19 1430  vancomycin (VANCOREADY) IVPB 1500 mg/300 mL  Status:  Discontinued     1,500 mg 150 mL/hr over 120 Minutes Intravenous Every 24 hours 12/03/19 1435 12/13/19 1057   12/03/19 2130  ceFEPIme (MAXIPIME) 2 g in sodium chloride 0.9 % 100 mL IVPB  Status:  Discontinued     2 g 200 mL/hr over 30 Minutes Intravenous Every 8 hours 12/03/19 1435 12/09/19 1107   12/03/19 1330  ceFEPIme (MAXIPIME) 2 g in sodium chloride 0.9 % 100 mL IVPB     2 g 200 mL/hr over 30 Minutes Intravenous  Once 12/03/19 1307 12/03/19 1559   12/03/19 1315  metroNIDAZOLE (FLAGYL) IVPB 500 mg     500 mg 100  mL/hr over 60 Minutes Intravenous  Once 12/03/19 1307 12/03/19 1559   12/03/19 1315  vancomycin (VANCOCIN) IVPB 1000 mg/200 mL premix  Status:  Discontinued     1,000 mg 200 mL/hr over 60 Minutes Intravenous  Once 12/03/19 1307 12/03/19 1311   12/03/19 1315  vancomycin (VANCOCIN) 2,500 mg in sodium chloride 0.9 % 500 mL IVPB     2,500 mg 250 mL/hr over 120 Minutes Intravenous  Once 12/03/19 1311 12/03/19 1658       Assessment/Plan Atrial fibrillation on Eliquis Baseline confusion/bipolar disorder Type 2 diabetes Chronic respiratory failure on home O2 Covid infection January 2021 Possible HCAP  Sepsis Bacteremia- strep viridans Left buttocks abscess Stage IV pressure ulcers buttocks S/pI&D of left buttocks abscess 12/04/2019 Dr. Georganna Skeans, 4/29 Dr. Ninfa Linden - POD #10, 3 - Wounds are all clean and packed - Air mattress, frequent turns - PT  - Cont  abx. Cx's with rare staph warneri and strep anginosis. TRH has consulted ID   -no further surgical needs.  Will defer further wound care to wound service and she may follow up with the wound center as an outpatient. -cont dressing changes as ordered  FEN: Carb modified diet ID: Flagyl x1 dose 4/22; cefepime/vancomycin 4/22>>Flagyl 4/24 >> WBC10.7 DVT: SCDs;Eliquis Follow-up: Wound care center   LOS: 11 days    Henreitta Cea , [facility] 12/14/2019, 8:21 AM Please see Amion for pager number during day hours 7:00am-4:30pm or 7:00am -11:30am on weekends

## 2019-12-15 DIAGNOSIS — S31000D Unspecified open wound of lower back and pelvis without penetration into retroperitoneum, subsequent encounter: Secondary | ICD-10-CM

## 2019-12-15 DIAGNOSIS — E872 Acidosis: Secondary | ICD-10-CM

## 2019-12-15 LAB — CBC
HCT: 33.8 % — ABNORMAL LOW (ref 36.0–46.0)
Hemoglobin: 9.2 g/dL — ABNORMAL LOW (ref 12.0–15.0)
MCH: 27.9 pg (ref 26.0–34.0)
MCHC: 27.2 g/dL — ABNORMAL LOW (ref 30.0–36.0)
MCV: 102.4 fL — ABNORMAL HIGH (ref 80.0–100.0)
Platelets: 350 10*3/uL (ref 150–400)
RBC: 3.3 MIL/uL — ABNORMAL LOW (ref 3.87–5.11)
RDW: 16.7 % — ABNORMAL HIGH (ref 11.5–15.5)
WBC: 12 10*3/uL — ABNORMAL HIGH (ref 4.0–10.5)
nRBC: 0.7 % — ABNORMAL HIGH (ref 0.0–0.2)

## 2019-12-15 LAB — BLOOD GAS, ARTERIAL
Acid-Base Excess: 22.3 mmol/L — ABNORMAL HIGH (ref 0.0–2.0)
Acid-Base Excess: 24.2 mmol/L — ABNORMAL HIGH (ref 0.0–2.0)
Bicarbonate: 49.1 mmol/L — ABNORMAL HIGH (ref 20.0–28.0)
Bicarbonate: 50 mmol/L — ABNORMAL HIGH (ref 20.0–28.0)
Drawn by: 137461
Drawn by: 137461
FIO2: 24
FIO2: 30
O2 Saturation: 83.7 %
O2 Saturation: 93.2 %
Patient temperature: 37
Patient temperature: 37
pCO2 arterial: 89.6 mmHg (ref 32.0–48.0)
pCO2 arterial: 67.1 mmHg (ref 32.0–48.0)
pH, Arterial: 7.358 (ref 7.350–7.450)
pH, Arterial: 7.485 — ABNORMAL HIGH (ref 7.350–7.450)
pO2, Arterial: 48 mmHg — ABNORMAL LOW (ref 83.0–108.0)
pO2, Arterial: 58.4 mmHg — ABNORMAL LOW (ref 83.0–108.0)

## 2019-12-15 LAB — COMPREHENSIVE METABOLIC PANEL
ALT: 16 U/L (ref 0–44)
AST: 22 U/L (ref 15–41)
Albumin: 1.8 g/dL — ABNORMAL LOW (ref 3.5–5.0)
Alkaline Phosphatase: 98 U/L (ref 38–126)
BUN: 6 mg/dL — ABNORMAL LOW (ref 8–23)
CO2: >50 mmol/L — ABNORMAL HIGH (ref 22–32)
Calcium: 8.8 mg/dL — ABNORMAL LOW (ref 8.9–10.3)
Chloride: 88 mmol/L — ABNORMAL LOW (ref 98–111)
Creatinine, Ser: 0.32 mg/dL — ABNORMAL LOW (ref 0.44–1.00)
GFR calc Af Amer: >60 mL/min (ref >60)
GFR calc non Af Amer: >60 mL/min (ref >60)
Glucose, Bld: 171 mg/dL — ABNORMAL HIGH (ref 70–99)
Potassium: 4.5 mmol/L (ref 3.5–5.1)
Sodium: 144 mmol/L (ref 135–145)
Total Bilirubin: 0.8 mg/dL (ref 0.3–1.2)
Total Protein: 5.7 g/dL — ABNORMAL LOW (ref 6.5–8.1)

## 2019-12-15 LAB — GLUCOSE, CAPILLARY
Glucose-Capillary: 159 mg/dL — ABNORMAL HIGH (ref 70–99)
Glucose-Capillary: 125 mg/dL — ABNORMAL HIGH (ref 70–99)
Glucose-Capillary: 82 mg/dL (ref 70–99)
Glucose-Capillary: 117 mg/dL — ABNORMAL HIGH (ref 70–99)

## 2019-12-15 LAB — MAGNESIUM: Magnesium: 1.9 mg/dL (ref 1.7–2.4)

## 2019-12-15 MED ORDER — OXYCODONE HCL 5 MG PO TABS
5.0000 mg | ORAL_TABLET | ORAL | Status: DC | PRN
  Administered 2019-12-15 – 2019-12-17 (×3): 5 mg via ORAL
  Filled 2019-12-15 (×3): qty 1

## 2019-12-15 NOTE — Progress Notes (Signed)
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 s/p I&D L buttocks abscess on 4/23, repeat I&D 4/29.    PT Comments    Pt in bed upon arrival of PT, tolerated bed mobility for wound care and pericare as pt was soiled upon arrival of PT. The pt demonstrates minimal command following through session other than nodding "yes" to a few yes/no questions. The pt tolerated PROM of BLE, but was unable to assist in mobility of the LE. The pt was able to use UE to assist in maintaining sidelying position, but was otherwise reliant on totalA of 2 to complete all bed mobility and rolling. The pt may not be appropriate for PT due to lack of command following and mobility, will follow for one more visit on trial basis before signing off. If the pt is able to demo slightly improved tolerance for mobility and active participation in the sessions, she may benefit from skilled PT to reduce caregiver burden and improve functional strength, will continue to assess.     Follow Up Recommendations  SNF;Supervision/Assistance - 24 hour     Equipment Recommendations  None recommended by PT(defer to post acute)    Recommendations for Other Services       Precautions / Restrictions Precautions Precautions: Fall Precaution Comments: sacral wound - pressure relief Restrictions Weight Bearing Restrictions: No    Mobility  Bed Mobility Overal bed mobility: Needs Assistance Bed Mobility: Rolling Rolling: Total assist;+2 for physical assistance;+2 for safety/equipment          General bed mobility comments: Total + 2 for bilateral rolling, hand-over hand used to initiate reach/roll to each side, pt not assisting, some resistive movement, likely due to pain in sacrum from movement. limited session to bed mobility to allow for wound care and pericare, unsafe to attempt transfer at this time due to lack of command following  Transfers                 General transfer comment: unable this day  Ambulation/Gait             General Gait Details: unable this day   Stairs             Wheelchair Mobility    Modified Rankin (Stroke Patients Only)       Balance Overall balance assessment: Needs assistance                                          Cognition Arousal/Alertness: Awake/alert Behavior During Therapy: Flat affect;Restless Overall Cognitive Status: Difficult to assess                                 General Comments: Pt with minimal responsiveness/engagement. Answered a few yes/no questions with use of head nods, but no active command following for any other commands noted through session. RN reports this is how the pt has been all day, but is significantly less  responsive than prior PT note      Exercises General Exercises - Lower Extremity Ankle Circles/Pumps: PROM;Both;10 reps;Supine Heel Slides: PROM;Both;10 reps;Supine Hip ABduction/ADduction: PROM;Both;10 reps;Supine    General Comments General comments (skin integrity, edema, etc.): Upon PT arrival, pt noteably soiled, and RN needing to provide wound care to sacral ulcer as well. Pt did not demonstrate any active command following during session other than nod to yes/no question about pronunciation of her last name. Prevalon boots donned bilaterally at end of session      Pertinent Vitals/Pain Pain Assessment: Faces Faces Pain Scale: Hurts even more Pain Location: sacrum, especially with movement Pain Descriptors / Indicators:  Sore;Discomfort;Grimacing;Guarding Pain Intervention(s): Limited activity within patient's tolerance;Repositioned    Home Living                      Prior Function            PT Goals (current goals can now be found in the care plan section) Acute Rehab PT Goals PT Goal Formulation: With patient Time For Goal Achievement: 12/21/19 Potential to Achieve Goals: Fair Progress towards PT goals: Progressing toward goals(total care with no active command following)    Frequency    Min 2X/week      PT Plan Current plan remains appropriate    Co-evaluation              AM-PAC PT "6 Clicks" Mobility   Outcome Measure  Help needed turning from your back to your side while in a flat bed without using bedrails?: Total Help needed moving from lying on your back to sitting on the side of a flat bed without using bedrails?: Total Help needed moving to and from a bed to a chair (including a wheelchair)?: Total Help needed standing up from a chair using your arms (e.g., wheelchair or bedside chair)?: Total Help needed to walk in hospital room?: Total Help needed climbing 3-5 steps with a railing? : Total 6 Click Score: 6    End of Session Equipment Utilized During Treatment: (pt on bipap) Activity Tolerance: Patient limited by fatigue;Patient limited by pain Patient left: in bed;with call bell/phone within reach;with bed alarm set;with nursing/sitter in room;Other (comment)(prevalon boots) Nurse Communication: Mobility status PT Visit Diagnosis: Other abnormalities of gait and mobility (R26.89);Muscle weakness (generalized) (M62.81)     Time: 1512-1601(out of room between 1530 and 1538 for total time of 41 min) PT Time Calculation (min) (ACUTE ONLY): 49 min  Charges:  $Therapeutic Exercise: 8-22 mins $Therapeutic Activity: 23-37 mins                     Rolm Baptise, PT, DPT   Acute Rehabilitation Department Pager #: [phone]   Gaetana Michaelis 12/15/2019,  4:30 PM

## 2019-12-15 NOTE — Progress Notes (Signed)
Progress Note    Becky Hendricks  HCW:[id number] DOB: [birth year]  DOA: 12/03/2019 PCP: Roderic Scarce, MD    Brief Narrative:     Medical records reviewed and are as summarized below:  Becky Hendricks is an 74 y.o. 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  She was noted to have a very large deep sacral wound with purulent drainage.  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  She is status post I&D of the left buttock abscesses on 4/23 as well as 4/29.  Awaiting skilled nursing facility placement.  Will need IV antibiotics through 6/10.   Assessment/Plan:   Principal Problem:   Osteomyelitis (HCC) Active Problems:   Acute metabolic encephalopathy   Severe sepsis (HCC)   Abscess of multiple sites of buttock   AF (paroxysmal atrial fibrillation) (HCC)   Hypoxia   Sacral wound   Sepsis presented on admission with left buttock abscess with purulent drainage, stage IV pressure ulcer present on admission -Status post I&D left buttock abscess on 4/23 and again on 4/29, no plan for further surgical intervention, will resume elliquis, f/u with wound care Dr. Leanord Hawking  -Wound culture grew RARE STAPHYLOCOCCUS WARNERI and strep anginosis -blood culture Streptococcus viridans only in one of the anaerobic bottle only is considered contamination -Echocardiogram no mention of vegation --Has been on vancomycin, cefepime and Flagyl from admission to 4/28, now on vanc and unasyn, plan for 6 weeks of abx with vanc and unasyn for 6 weeks: end date on 01/21/2020, f/u with ID in two weeks. Monitor stool texture  due to h/o cdiff, low threshold to start oral Vanco  per  ID recommendation -single lumen midline in left arm that was placed on 4/25  CO2 retention resulting in lethargy -BMP shows CO2 of greater than 50 - place on BiPAP -Order ABG -We will get follow-up ABG 2 hours after BiPAP placed -Change patient to progressive care  Possible HCAP CT scan abdomen raised the question of a possible right middle and lower lobe infiltrate - patient is not hypoxic on her baseline oxygen support - -by report she has chronic respiratory failure on 2 L home oxygen but with CO2 retention will need to be monitored closely She did well with speech eval, aspiration precaution  Acute metabolic encephalopathy versus chronic cognitive deficit Baseline mental status not clear but reportedly the patient is confused at baseline - B12, ammonia, and folic acid are all normal -Per nursing report patient is more lethargic today, CO2 on BMP shows greater than 50 so will place on BiPAP  Paroxysmal A. fib -Coreg -Eliquis held briefly due to I/d of the wound, resumed on 5/1  Hypertension Continue adjust bp meds to achieved normal blood pressure Currently on  Coreg and lisinopril   HLD Continue Lipitor  Insulin-dependent type 2 diabetes, uncontrolled with hyperglycemia A1c 7.8 Currently on Lantus 10 subcu daily and SSI, other home meds Janumet Victoza held since admission, plan to resume at discharge  Hypokalemia/hypomagnesemia/hypophosphatemia Monitor, replace as needed  QTC prolongation, keep on telemetry, follow-up EKG, keep K above 4 mag above 2 QTC 554 on presentation on April 22, repeat EKG QTC  456 on April 26 Continue monitor  Bipolar disorder Prolonged QT noted on EKG therefore quetiapine on hold  -continue Depakote  MRSA screen + Continue empiric vancomycin given extensive sacral wound  Obesity Estimated body mass index is 39.14 kg/m as calculated from the following:   Height as  of this encounter: 5\' 6"  (1.676 m).   Weight as of this encounter: 110 kg.   COVID-19 infection January 2021    Family Communication/Anticipated D/C date and plan/Code Status   DVT prophylaxis: Eliquis Code Status: Full Code.  Disposition Plan: Status is: Inpatient  Remains inpatient appropriate because:Inpatient level of care appropriate due to severity of illness   Dispo: The patient is from: SNF              Anticipated d/c is to: SNF              Anticipated d/c date is: 2 days              Patient currently is not medically stable to d/c.     Medical Consultants:    General surgery  Infectious disease    Subjective:   Nursing reports patient is more lethargic today  Objective:    Vitals:   12/14/19 1735 12/14/19 1937 12/15/19 0000 12/15/19 0742  BP: 140/82 (!) 144/70 (!) 135/58 (!) 170/68  Pulse: (!) 58 (!) 57 64 69  Resp:  (!) 36 (!) 24 (!) 22  Temp:  97.7 F (36.5 C)  98.1 F (36.7 C)  TempSrc:  Axillary    SpO2:  95% 95% 98%  Weight:      Height:        Intake/Output Summary (Last 24 hours) at 12/15/2019 0950 Last data filed at 12/15/2019 0400 Gross per 24 hour  Intake 1582.48 ml  Output 450 ml  Net 1132.48 ml   Filed Weights   12/03/19 1256 12/10/19 1222  Weight: 110 kg 110 kg    Exam: In bed, awake but minimally interactive No increased work of breathing, no wheezing auscultated anteriorly Regular rate and rhythm Positive bowel sounds, soft, non-tender Not following commands  Data Reviewed:   I have personally reviewed following labs and imaging studies:  Labs: Labs show the following:   Basic Metabolic Panel: Recent Labs  Lab 12/10/19 0500 12/10/19 0500 12/11/19 0500 12/11/19 0500 12/12/19 0418 12/12/19 0418 12/13/19 0500 12/15/19 0630  NA 139  --  142  --  143  --  142 144  K 4.4   < > 4.1   < > 3.8   < > 3.8 4.5  CL 93*  --  96*  --  96*  --  94* 88*  CO2 38*  --  37*  --  38*  --  39* >50*  GLUCOSE 162*  --  130*   --  139*  --  167* 171*  BUN 9  --  11  --  8  --  5* 6*  CREATININE 0.38*  --  0.45  --  0.42*  --  0.36* 0.32*  CALCIUM 8.1*  --  8.1*  --  8.4*  --  8.4* 8.8*  MG 1.7  --  1.9  --  1.9  --  1.9 1.9  PHOS  --   --  4.4  --   --   --   --   --    < > = values in this interval not displayed.   GFR Estimated Creatinine Clearance: 81.1 mL/min (A) (  by C-G formula based on SCr of 0.32 mg/dL (L)). Liver Function Tests: Recent Labs  Lab 12/15/19 0630  AST 22  ALT 16  ALKPHOS 98  BILITOT 0.8  PROT 5.7*  ALBUMIN 1.8*   No results for input(s): LIPASE, AMYLASE in the last 168 hours. No results for input(s): AMMONIA in the last 168 hours. Coagulation profile No results for input(s): INR, PROTIME in the last 168 hours.  CBC: Recent Labs  Lab 12/09/19 0500 12/11/19 0500 12/12/19 0418 12/13/19 0500 12/15/19 0630  WBC 11.1* 11.4* 11.6* 11.3* 12.0*  NEUTROABS 7.1 7.8* 7.6  --   --   HGB 9.6* 8.1* 8.6* 8.4* 9.2*  HCT 32.2* 28.8* 30.3* 30.1* 33.8*  MCV 93.1 98.6 97.1 97.1 102.4*  PLT 358 311 344 344 350   Cardiac Enzymes: No results for input(s): CKTOTAL, CKMB, CKMBINDEX, TROPONINI in the last 168 hours. BNP (last 3 results) No results for input(s): PROBNP in the last 8760 hours. CBG: Recent Labs  Lab 12/14/19 0817 12/14/19 1151 12/14/19 1651 12/14/19 2100 12/15/19 0743  GLUCAP 181* 173* 123* 125* 159*   D-Dimer: No results for input(s): DDIMER in the last 72 hours. Hgb A1c: No results for input(s): HGBA1C in the last 72 hours. Lipid Profile: No results for input(s): CHOL, HDL, LDLCALC, TRIG, CHOLHDL, LDLDIRECT in the last 72 hours. Thyroid function studies: No results for input(s): TSH, T4TOTAL, T3FREE, THYROIDAB in the last 72 hours.  Invalid input(s): FREET3 Anemia work up: No results for input(s): VITAMINB12, FOLATE, FERRITIN, TIBC, IRON, RETICCTPCT in the last 72 hours. Sepsis Labs: Recent Labs  Lab 12/11/19 0500 12/12/19 0418 12/13/19 0500 12/15/19 0630    WBC 11.4* 11.6* 11.3* 12.0*    Microbiology No results found for this or any previous visit (from the past 240 hour(s)).  Procedures and diagnostic studies:  DG Chest Port 1 View  Result Date: 12/14/2019 CLINICAL DATA:  Tachypnea. Evaluate for pneumonia. EXAM: PORTABLE CHEST 1 VIEW COMPARISON:  Radiographs 12/09/2019 and 12/03/2019. Abdominal CT 12/03/2019. FINDINGS: 1250 hours. Increased patient rotation to the left. The heart size and mediastinal contours are stable. There is persistent elevation of the right hemidiaphragm with associated right basilar airspace disease and a possible small right pleural effusion. Mild atelectasis is present at the left lung base. No edema or pneumothorax. The bones appear unchanged. IMPRESSION: Persistent right basilar infiltrate versus atelectasis with possible small right pleural effusion. Electronically Signed   By: Carey Bullocks M.D.   On: 12/14/2019 13:07    Medications:   . apixaban  5 mg Oral BID  . ascorbic acid  500 mg Oral BID  . atorvastatin  10 mg Oral Daily  . calcium carbonate  1 tablet Oral Daily  . carvedilol  25 mg Oral BID WC  . cholecalciferol  1,000 Units Oral Daily  . divalproex  125 mg Oral QHS  . divalproex  500 mg Oral Daily  . guaiFENesin  600 mg Oral BID  . hydrALAZINE  10 mg Intravenous Once  . insulin aspart  0-20 Units Subcutaneous TID WC  . insulin aspart  0-5 Units Subcutaneous QHS  . insulin glargine  10 Units Subcutaneous Daily  . lisinopril  20 mg Oral Daily  . senna-docusate  1 tablet Oral Daily  . sodium chloride flush  10-40 mL Intracatheter Q12H   Continuous Infusions: . sodium chloride 75 mL/hr at 12/14/19 1843  . ampicillin-sulbactam (UNASYN) IV 3 g (12/15/19 0821)  . vancomycin 1,000 mg (12/15/19 0942)  LOS: 12 days   Geradine Girt  [facility]   How to contact the [facility] Attending or Consulting provider [place] or covering provider during after hours [place], for this patient?   1. Check the care team in [facility] and look for a) attending/consulting TR[facility] provider listed and b) the [facility] team listed 2. Log into [email or web address] and use [place]'s universal password to access. If you do not have the password, please contact the hospital operator. 3. Locate the [facility] provider you are looking for under [facility] and page to a number that you can be directly reached. 4. If you still have difficulty reaching the provider, please page the [facility] (Director on Call) for the Hospitalists listed on amion for assistance.  12/15/2019, 9:50 AM

## 2019-12-15 NOTE — Addendum Note (Signed)
Addendum  created 12/15/19 1149 by Jodell Cipro, CRNA   Attestation recorded in [place], Intraprocedure Attestations filed

## 2019-12-15 NOTE — Social Work (Signed)
CSW completed in-person [place].   [employer], [address]

## 2019-12-15 NOTE — Progress Notes (Signed)
Patient's mentation has improved on Bipap.  Will remove Bipap and place back on her [place] with titration to 90-92% JV

## 2019-12-15 NOTE — Progress Notes (Signed)
Patient received on [place].  She is awake and alert.  Bipap remains on standby.

## 2019-12-15 NOTE — Consult Note (Signed)
WOC Nurse Consult Note: Patient receiving care in [facility] 2W04.  Discharged from surgical services today.  Needing order for BID saline moistened gauze dressing changes Reason for Consult:needed order entered to continue recommended surgical dressing orders for bid saline moistened gauze dressings to buttock and sacral wound Wound type: please see ample documentation by surgical services providing wound care to the buttocks and sacrum until signing off today.  Latest entry was 12/14/19 at 8:21 a.m. by K. Earl Gala, [place]. Pressure Injury POA: Yes/No/NA Measurement: Wound bed: Drainage (amount, consistency, odor)  Periwound: Dressing procedure/placement/frequency:  I have entered the orders for twice daily saline moistened gauze to be inserted into the buttock and sacral wounds, cover with ABD pads, tape in place.  This is the therapy recommended by K. Osborne in her note this morning.  It is now on the worklist for the bedside nurse. Monitor the wound area(s) for worsening of condition such as: Signs/symptoms of infection,  Increase in size,  Development of or worsening of odor, Development of pain, or increased pain at the affected locations.  Notify the medical team if any of these develop. WOC nurse will not follow at this time.  Please re-consult the WOC team if needed.  Helmut Muster, RN, MSN, CWOCN, CNS-BC, pager [phone]

## 2019-12-16 LAB — BASIC METABOLIC PANEL
Anion gap: 10 (ref 5–15)
BUN: 5 mg/dL — ABNORMAL LOW (ref 8–23)
CO2: 45 mmol/L — ABNORMAL HIGH (ref 22–32)
Calcium: 8.4 mg/dL — ABNORMAL LOW (ref 8.9–10.3)
Chloride: 85 mmol/L — ABNORMAL LOW (ref 98–111)
Creatinine, Ser: 0.48 mg/dL (ref 0.44–1.00)
GFR calc Af Amer: >60 mL/min (ref >60)
GFR calc non Af Amer: >60 mL/min (ref >60)
Glucose, Bld: 116 mg/dL — ABNORMAL HIGH (ref 70–99)
Potassium: 3.7 mmol/L (ref 3.5–5.1)
Sodium: 140 mmol/L (ref 135–145)

## 2019-12-16 LAB — CBC
HCT: 29.5 % — ABNORMAL LOW (ref 36.0–46.0)
Hemoglobin: 8.6 g/dL — ABNORMAL LOW (ref 12.0–15.0)
MCH: 28.3 pg (ref 26.0–34.0)
MCHC: 29.2 g/dL — ABNORMAL LOW (ref 30.0–36.0)
MCV: 97 fL (ref 80.0–100.0)
Platelets: 266 10*3/uL (ref 150–400)
RBC: 3.04 MIL/uL — ABNORMAL LOW (ref 3.87–5.11)
RDW: 17.5 % — ABNORMAL HIGH (ref 11.5–15.5)
WBC: 10.8 10*3/uL — ABNORMAL HIGH (ref 4.0–10.5)
nRBC: 0.3 % — ABNORMAL HIGH (ref 0.0–0.2)

## 2019-12-16 LAB — GLUCOSE, CAPILLARY
Glucose-Capillary: 144 mg/dL — ABNORMAL HIGH (ref 70–99)
Glucose-Capillary: 149 mg/dL — ABNORMAL HIGH (ref 70–99)
Glucose-Capillary: 187 mg/dL — ABNORMAL HIGH (ref 70–99)

## 2019-12-16 NOTE — Progress Notes (Signed)
Pharmacy Antibiotic Note  Becky Hendricks is a 74 y.o. female admitted on 12/03/2019 with sepsis.  Patient has purulent sacral wound and was taking CTX for 1 week prior. Has hx of recurrent UTI. Pharmacy has been consulted for Vancomycin dosing. Cefepime and flagyl transitioned to Unasyn. S/p I&D of the sacral wound (last on 4/29).   The patient was scheduled for a Vancomycin trough today however this has been delayed until 5/6 due to off-schedule doses. Noticed SCr uptrend to 0.48 - will monitor closely. The SCr has noted to oscillate between 0.3-0.5 this admission - planning a trough tomorrow prior to noon dose.   Plan: Continue Vancomycin 1000 mg IV Q12 hrs  Will obtain a Vancomycin trough on 5/6 Unasyn 3g IV q6h Monitor clinical progress, c/s, renal function F/u de-escalation plan/LOT, vancomycin levels as indicated   Height: 5\' 6"  (167.6 cm) Weight: 110 kg (242 lb 8.1 oz) IBW/kg (Calculated) : 59.3  Temp (24hrs), Avg:98.3 F (36.8 C), Min:98.1 F (36.7 C), Max:98.5 F (36.9 C)  Recent Labs  Lab 12/11/19 0500 12/12/19 0418 12/13/19 0500 12/13/19 0935 12/15/19 0630 12/16/19 0449  WBC 11.4* 11.6* 11.3*  --  12.0* 10.8*  CREATININE 0.45 0.42* 0.36*  --  0.32* 0.48  VANCOTROUGH  --   --   --  8*  --   --     Estimated Creatinine Clearance: 81.1 mL/min (by C-G formula based on SCr of 0.48 mg/dL).    Allergies  Allergen Reactions  . Chlorhexidine Gluconate     itching    Antimicrobials this admission: Vanc 4/22 >> Flagyl 4/22 x1; 4/24 >>4/28 Cefepime 4/22 >>4/28 Unasyn 4/28 >>  Dose adjustments this admission: 4/25 VT>>15 - continue 5/2 VT 8 - adjust to 1g IV every 12 hours   Microbiology results: 4/22 BC: 1/3 viridan strep 4/22 UCx >>neg 4/23 L-buttock abscess: rare staphylococcus warneri, strep anginosis 4/23 MRSA pcr +  Thank you for allowing pharmacy to be a part of this patient's care.  5/23, PharmD, BCPS Clinical Pharmacist Clinical phone  for 12/16/2019: [phone] 12/16/2019 10:55 AM   **Pharmacist phone directory can now be found on [email or web address] ([email or web address]).  Listed under [facility].

## 2019-12-16 NOTE — Progress Notes (Signed)
Bipap in use.   

## 2019-12-16 NOTE — Progress Notes (Signed)
PROGRESS NOTE    Becky Hendricks  HUD:[id number] DOB: 03-Jan-1946 DOA: 12/03/2019 PCP: [name], MD     Brief Narrative:  Becky Hendricks is an 74 y.o. 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 She was noted to have a very large deep sacral wound with purulent drainage. 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  She is status post I&D of the left buttock abscesses on 4/23 as well as 4/29.  Awaiting skilled nursing facility placement.  Will need IV antibiotics through 6/10.  New events last 24 hours / Subjective: Patient alert, poor historian.  She does not verbalize any new complaints or concerns.  Discussed patient's care with brother over the phone per his request.  He is concerned about her upper extremity strength.  Assessment & Plan:   Principal Problem:   Osteomyelitis ([place]) Active Problems:   Acute metabolic encephalopathy   Severe sepsis (HCC)   Abscess of multiple sites of buttock   AF (paroxysmal atrial fibrillation) (HCC)   Hypoxia   Sacral wound    Sepsis with left buttock abscess with purulent drainage, stage IV pressure ulcer, POA -Status post I&D left buttock abscess on 4/23 and on 4/29, no plan for further surgical intervention, will resume elliquis, f/u with wound care Dr. Dellia Nims  -Wound culture grew staph warneri and strep anginosis -Blood culture streptococcus viridans only in one of the anaerobic bottle only is considered contamination -Echocardiogram no mention of vegation -Has been on vancomycin, cefepime and Flagyl from 4/22-4/28, now on vanc and unasyn. Plan for  6 weeks of abx with vanc andunasyn: end date on 01/21/2020, f/u with ID in two weeks. Monitor stool texture due to h/o cdiff, low threshold to start oral Vanco perID recommendation -Continue wound care   Acute hypercapnic respiratory failure -Resolved after BiPAP  -BiPAP qhs    Acute metabolic encephalopathy versus chronic cognitive deficit -Per brother, patient was living alone at home in December prior to multitude of acute illnesses requiring hospitalizations and SNF placement. Has bipolar at baseline -CT head 4/22: Atrophy, chronic microvascular disease. -Seems to be now her new normal  Paroxysmal A. fib -Eliquis, coreg   Hypertension -Coreg and lisinopril  HLD -Lipitor  Insulin-dependent type 2 diabetes, uncontrolled with hyperglycemia -A1c 7.8 -Lantus, SSI   Bipolar disorder -Prolonged QT noted on EKG therefore quetiapine on hold  -Continue Depakote  Obesity Estimated body mass index is 39.14 kg/m as calculated from the following:   Height as of this encounter: 5\' 6"  (1.676 m).   Weight as of this encounter: 110 kg.  COVID-19 infection January 2021  -Covid test 4/22 negative    DVT prophylaxis: Eliquis Code Status: Full Family Communication: Discussed with brother over the phone this afternoon  Disposition Plan:   Status is: Inpatient  Remains inpatient appropriate because:Unsafe d/c plan and Inpatient level of care appropriate due to severity of illness   Dispo: The patient is from: SNF              Anticipated d/c is to: SNF              Anticipated d/c date is: 1 day  Patient currently is not medically stable to d/c.   Consultants:   General surgery  Infectious disease   Antimicrobials:  Anti-infectives (From admission, onward)   Start     Dose/Rate Route Frequency Ordered Stop   12/13/19 2200  vancomycin (VANCOCIN) IVPB 1000 mg/200 mL premix     1,000 mg 200 mL/hr over 60 Minutes Intravenous Every 12 hours 12/13/19  1057     12/09/19 1400  Ampicillin-Sulbactam (UNASYN) 3 g in sodium chloride 0.9 % 100 mL IVPB     3 g 200 mL/hr over 30 Minutes Intravenous Every 6 hours 12/09/19 1107     12/05/19 1500  metroNIDAZOLE (FLAGYL) IVPB 500 mg  Status:  Discontinued     500 mg 100 mL/hr over 60 Minutes Intravenous Every 8 hours 12/05/19 1342 12/09/19 1107   12/04/19 1430  vancomycin (VANCOREADY) IVPB 1500 mg/300 mL  Status:  Discontinued     1,500 mg 150 mL/hr over 120 Minutes Intravenous Every 24 hours 12/03/19 1435 12/13/19 1057   12/03/19 2130  ceFEPIme (MAXIPIME) 2 g in sodium chloride 0.9 % 100 mL IVPB  Status:  Discontinued     2 g 200 mL/hr over 30 Minutes Intravenous Every 8 hours 12/03/19 1435 12/09/19 1107   12/03/19 1330  ceFEPIme (MAXIPIME) 2 g in sodium chloride 0.9 % 100 mL IVPB     2 g 200 mL/hr over 30 Minutes Intravenous  Once 12/03/19 1307 12/03/19 1559   12/03/19 1315  metroNIDAZOLE (FLAGYL) IVPB 500 mg     500 mg 100 mL/hr over 60 Minutes Intravenous  Once 12/03/19 1307 12/03/19 1559   12/03/19 1315  vancomycin (VANCOCIN) IVPB 1000 mg/200 mL premix  Status:  Discontinued     1,000 mg 200 mL/hr over 60 Minutes Intravenous  Once 12/03/19 1307 12/03/19 1311   12/03/19 1315  vancomycin (VANCOCIN) 2,500 mg in sodium chloride 0.9 % 500 mL IVPB     2,500 mg 250 mL/hr over 120 Minutes Intravenous  Once 12/03/19 1311 12/03/19 1658        Objective: Vitals:   12/15/19 1959 12/16/19 0011 12/16/19 0634 12/16/19 0827  BP: 129/62  (!) 172/79 (!) 177/75  Pulse: (!) 58 73 61 64  Resp:   (!) 26 17  Temp: 98.2 F (36.8 C)  98.4 F (36.9 C) 98.1 F (36.7 C)  TempSrc:   Oral Oral  SpO2: 91% 93% 93% 90%  Weight:      Height:        Intake/Output Summary (Last 24 hours) at 12/16/2019 1422 Last data filed at 12/16/2019 0600 Gross per 24 hour  Intake 740 ml  Output 1000 ml  Net -260 ml   Filed Weights   12/03/19 1256 12/10/19 1222  Weight: 110 kg 110 kg    Examination:  General exam:  Appears calm and comfortable  Respiratory system: Clear to auscultation. Respiratory effort normal. No respiratory distress. No conversational dyspnea.  Cardiovascular system: S1 & S2 heard, RRR. No murmurs. No pedal edema. Gastrointestinal system: Abdomen is nondistended, soft and nontender. Normal bowel sounds heard. Central nervous system: Alert  Extremities: Symmetric in appearance   Data Reviewed: I have personally reviewed following labs and imaging studies  CBC: Recent Labs  Lab 12/11/19 0500 12/12/19 0418 12/13/19 0500 12/15/19 0630 12/16/19 0449  WBC 11.4* 11.6* 11.3* 12.0* 10.8*  NEUTROABS 7.8* 7.6  --   --   --   HGB 8.1* 8.6* 8.4* 9.2* 8.6*  HCT 28.8* 30.3* 30.1* 33.8* 29.5*  MCV  98.6 97.1 97.1 102.4* 97.0  PLT 311 344 344 350 266   Basic Metabolic Panel: Recent Labs  Lab 12/10/19 0500 12/10/19 0500 12/11/19 0500 12/12/19 0418 12/13/19 0500 12/15/19 0630 12/16/19 0449  NA 139   < > 142 143 142 144 140  K 4.4   < > 4.1 3.8 3.8 4.5 3.7  CL 93*   < > 96* 96* 94* 88* 85*  CO2 38*   < > 37* 38* 39* >50* 45*  GLUCOSE 162*   < > 130* 139* 167* 171* 116*  BUN 9   < > 11 8 5* 6* 5*  CREATININE 0.38*   < > 0.45 0.42* 0.36* 0.32* 0.48  CALCIUM 8.1*   < > 8.1* 8.4* 8.4* 8.8* 8.4*  MG 1.7  --  1.9 1.9 1.9 1.9  --   PHOS  --   --  4.4  --   --   --   --    < > = values in this interval not displayed.   GFR: Estimated Creatinine Clearance: 81.1 mL/min (by C-G formula based on SCr of 0.48 mg/dL). Liver Function Tests: Recent Labs  Lab 12/15/19 0630  AST 22  ALT 16  ALKPHOS 98  BILITOT 0.8  PROT 5.7*  ALBUMIN 1.8*   No results for input(s): LIPASE, AMYLASE in the last 168 hours. No results for input(s): AMMONIA in the last 168 hours. Coagulation Profile: No results for input(s): INR, PROTIME in the last 168 hours. Cardiac Enzymes: No results for input(s): CKTOTAL, CKMB, CKMBINDEX, TROPONINI in the last 168 hours. BNP (last 3 results) No results for  input(s): PROBNP in the last 8760 hours. HbA1C: No results for input(s): HGBA1C in the last 72 hours. CBG: Recent Labs  Lab 12/15/19 1137 12/15/19 1541 12/15/19 2029 12/16/19 0958 12/16/19 1202  GLUCAP 125* 82 117* 144* 149*   Lipid Profile: No results for input(s): CHOL, HDL, LDLCALC, TRIG, CHOLHDL, LDLDIRECT in the last 72 hours. Thyroid Function Tests: No results for input(s): TSH, T4TOTAL, FREET4, T3FREE, THYROIDAB in the last 72 hours. Anemia Panel: No results for input(s): VITAMINB12, FOLATE, FERRITIN, TIBC, IRON, RETICCTPCT in the last 72 hours. Sepsis Labs: No results for input(s): PROCALCITON, LATICACIDVEN in the last 168 hours.  No results found for this or any previous visit (from the past 240 hour(s)).    Radiology Studies: No results found.    Scheduled Meds: . apixaban  5 mg Oral BID  . ascorbic acid  500 mg Oral BID  . atorvastatin  10 mg Oral Daily  . calcium carbonate  1 tablet Oral Daily  . carvedilol  25 mg Oral BID WC  . cholecalciferol  1,000 Units Oral Daily  . divalproex  125 mg Oral QHS  . divalproex  500 mg Oral Daily  . guaiFENesin  600 mg Oral BID  . insulin aspart  0-20 Units Subcutaneous TID WC  . insulin aspart  0-5 Units Subcutaneous QHS  . insulin glargine  10 Units Subcutaneous Daily  . lisinopril  20 mg Oral Daily  . senna-docusate  1 tablet Oral Daily  . sodium chloride flush  10-40 mL Intracatheter Q12H   Continuous Infusions: . ampicillin-sulbactam (UNASYN) IV 3 g (12/16/19 0953)  . vancomycin 1,000 mg (12/16/19 1204)     LOS: 13 days      Time spent: 45 minutes   Noralee Stain, DO [facility] 12/16/2019, 2:22 PM   Available via Epic secure chat 7am-7pm After these hours, please refer to  coverage provider listed on [email or web address]

## 2019-12-16 NOTE — TOC Progression Note (Addendum)
Transition of Care [facility]) - Progression Note    Patient Details  Name: Jamee Keach MRN: [id number] Date of Birth: 06-Jul-1946  Transition of Care [facility]) CM/SW Contact  Erin Sons, [place] Phone Number: 12/16/2019, 12:38 PM  Clinical Narrative:     CSW started auth with [employer]. Reference Number [id number]. Clinicals faxed. Everardo Pacific at [facility] notified of discharge pending auth.   1346: CSW called brother [name] and informed him of upcoming discharge pending insurance auth. He expressed doubt in his sister's ability to rehab at snf and wanted to speak with physician regarding medical condition. CSW passed information on to attending physician. Brother requested to be called at time of transport.   Expected Discharge Plan: Skilled Nursing Facility Barriers to Discharge: Continued Medical Work up  Expected Discharge Plan and Services Expected Discharge Plan: Skilled Nursing Facility       Living arrangements for the past 2 months: Skilled Nursing Facility                                       Social Determinants of Health (SDOH) Interventions    Readmission Risk Interventions No flowsheet data found.

## 2019-12-17 LAB — GLUCOSE, CAPILLARY
Glucose-Capillary: 170 mg/dL — ABNORMAL HIGH (ref 70–99)
Glucose-Capillary: 241 mg/dL — ABNORMAL HIGH (ref 70–99)
Glucose-Capillary: 217 mg/dL — ABNORMAL HIGH (ref 70–99)

## 2019-12-17 LAB — VANCOMYCIN, TROUGH: Vancomycin Tr: 19 ug/mL (ref 15–20)

## 2019-12-17 LAB — RESPIRATORY PANEL BY RT PCR (FLU A&B, COVID)
Influenza A by PCR: NEGATIVE
Influenza B by PCR: NEGATIVE
SARS Coronavirus 2 by RT PCR: NEGATIVE

## 2019-12-17 MED ORDER — AMPICILLIN-SULBACTAM IV (FOR PTA / DISCHARGE USE ONLY)
3.0000 g | Freq: Four times a day (QID) | INTRAVENOUS | 0 refills | Status: DC
Start: 2019-12-17 — End: 2020-01-06

## 2019-12-17 MED ORDER — CARVEDILOL 25 MG PO TABS: 25.0000 mg | ORAL_TABLET | Freq: Two times a day (BID) | ORAL | 0 refills | Status: DC

## 2019-12-17 MED ORDER — VANCOMYCIN IV (FOR PTA / DISCHARGE USE ONLY): 1000.0000 mg | Freq: Two times a day (BID) | INTRAVENOUS | 0 refills | Status: DC

## 2019-12-17 NOTE — Progress Notes (Signed)
PROGRESS NOTE    Becky Hendricks  BLT:[id number] DOB: [birth year] DOA: 12/03/2019 PCP: Roderic Scarce, MD     Brief Narrative:  Becky Hendricks is an 74 y.o. 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 She was noted to have a very large deep sacral wound with purulent drainage. 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  She is status post I&D of the left buttock abscesses on 4/23 as well as 4/29.  Awaiting skilled nursing facility placement.  Will need IV antibiotics through 6/10.  New events last 24 hours / Subjective: States "release my water" twice but does not elaborate. Denies any pain.   Assessment & Plan:   Principal Problem:   Osteomyelitis (HCC) Active Problems:   Acute metabolic encephalopathy   Severe sepsis (HCC)   Abscess of multiple sites of buttock   AF (paroxysmal atrial fibrillation) (HCC)   Hypoxia   Sacral wound    Sepsis with left buttock abscess with purulent drainage, stage IV pressure ulcer, POA -Status post I&D left buttock abscess on 4/23 and on 4/29, no plan for further surgical intervention, will resume elliquis, f/u with wound care Dr. Leanord Hawking  -Wound culture grew staph warneri and strep anginosis -Blood culture streptococcus viridans only in one of the anaerobic bottle only is considered contamination -Echocardiogram no mention of vegation -Has been on vancomycin, cefepime and Flagyl from 4/22-4/28, now on vanc and unasyn. Plan for 6 weeks of abx with vanc andunasyn: end date on 01/21/2020, f/u with ID in two weeks. Monitor stool texture due to h/o cdiff, low  threshold to start oral Vanco perID recommendation -Continue wound care   Acute hypercapnic respiratory failure -Resolved after BiPAP  -BiPAP qhs    Acute metabolic encephalopathy versus chronic cognitive deficit -Per brother, patient was living alone at home in December prior to multitude of acute illnesses requiring hospitalizations and SNF placement. Has bipolar at baseline -CT head 4/22: Atrophy, chronic microvascular disease. -Seems to be now her new normal  Paroxysmal A. fib -Eliquis, coreg   Hypertension -Coreg and lisinopril  HLD -Lipitor  Insulin-dependent type 2 diabetes, uncontrolled with hyperglycemia -A1c 7.8 -Lantus, SSI   Bipolar disorder -Prolonged QT noted on EKG therefore quetiapine on hold  -Continue Depakote  Obesity Estimated body mass index is 39.14 kg/m as calculated from the following:   Height as of this encounter: 5\' 6"  (1.676 m).   Weight as of this encounter: 110 kg.  COVID-19 infection January 2021  -Covid test 4/22 negative    DVT prophylaxis: Eliquis Code Status: Full Family Communication: Discussed with brother over the phone today  Disposition Plan:   Status is: Inpatient  Remains inpatient appropriate because:Unsafe d/c plan   Dispo: The patient is from: SNF              Anticipated d/c is to: SNF              Anticipated d/c date is: 1 day              Patient currently is medically stable to d/c. Awaiting repeat covid test and SNF placement/insurance auth    Consultants:   General surgery  Infectious disease   Antimicrobials:  Anti-infectives (From admission, onward)   Start     Dose/Rate Route Frequency Ordered Stop   12/13/19 2200  vancomycin (VANCOCIN) IVPB 1000 mg/200 mL premix     1,000 mg 200 mL/hr over 60 Minutes Intravenous Every 12 hours 12/13/19 1057     12/09/19 1400  Ampicillin-Sulbactam (UNASYN) 3 g in sodium chloride 0.9 % 100 mL IVPB     3 g 200 mL/hr over 30 Minutes Intravenous Every 6  hours 12/09/19 1107     12/05/19 1500  metroNIDAZOLE (FLAGYL) IVPB 500 mg  Status:  Discontinued     500 mg 100 mL/hr over 60 Minutes Intravenous Every 8 hours 12/05/19 1342 12/09/19 1107   12/04/19 1430  vancomycin (VANCOREADY) IVPB 1500 mg/300 mL  Status:  Discontinued     1,500 mg 150 mL/hr over 120 Minutes Intravenous Every 24 hours 12/03/19 1435 12/13/19 1057   12/03/19 2130  ceFEPIme (MAXIPIME) 2 g in sodium chloride 0.9 % 100 mL IVPB  Status:  Discontinued     2 g 200 mL/hr over 30 Minutes Intravenous Every 8 hours 12/03/19 1435 12/09/19 1107   12/03/19 1330  ceFEPIme (MAXIPIME) 2 g in sodium chloride 0.9 % 100 mL IVPB     2 g 200 mL/hr over 30 Minutes Intravenous  Once 12/03/19 1307 12/03/19 1559   12/03/19 1315  metroNIDAZOLE (FLAGYL) IVPB 500 mg     500 mg 100 mL/hr over 60 Minutes Intravenous  Once 12/03/19 1307 12/03/19 1559   12/03/19 1315  vancomycin (VANCOCIN) IVPB 1000 mg/200 mL premix  Status:  Discontinued     1,000 mg 200 mL/hr over 60 Minutes Intravenous  Once 12/03/19 1307 12/03/19 1311   12/03/19 1315  vancomycin (VANCOCIN) 2,500 mg in sodium chloride 0.9 % 500 mL IVPB     2,500 mg 250 mL/hr over 120 Minutes Intravenous  Once 12/03/19 1311 12/03/19 1658       Objective: Vitals:   12/16/19 2208 12/16/19 2349 12/17/19 0425 12/17/19 0814  BP:  (!) 154/75 (!) 148/76 (!) 154/87  Pulse: 64 60 (!) 54 (!) 57  Resp:      Temp:  98.4 F (36.9 C) 97.7 F (36.5 C) 97.8 F (36.6 C)  TempSrc:  Oral Axillary Oral  SpO2: 98% 98% 98% 91%  Weight:      Height:        Intake/Output Summary (Last 24 hours) at 12/17/2019 1331 Last data filed at 12/17/2019 0821 Gross per 24 hour  Intake 10 ml  Output 3100 ml  Net -3090 ml   Filed Weights   12/03/19 1256 12/10/19 1222  Weight: 110 kg 110 kg    Examination: General exam: Appears calm and comfortable  Respiratory system: Clear to auscultation. Respiratory effort normal.  Cardiovascular system: S1 & S2 heard, RRR. No  pedal edema. Gastrointestinal system: Abdomen is nondistended, soft and nontender. Normal bowel sounds heard. Central nervous system: Alert  Extremities: Symmetric in appearance bilaterally  Psychiatry: Poor historian   Data Reviewed: I have personally reviewed following labs and imaging studies  CBC: Recent Labs  Lab 12/11/19 0500 12/12/19 0418 12/13/19 0500 12/15/19 0630 12/16/19 0449  WBC 11.4* 11.6* 11.3* 12.0* 10.8*  NEUTROABS 7.8* 7.6  --   --   --   HGB 8.1* 8.6* 8.4* 9.2* 8.6*  HCT 28.8* 30.3* 30.1* 33.8* 29.5*  MCV 98.6 97.1 97.1 102.4* 97.0  PLT 311 344 344 350 347   Basic Metabolic Panel: Recent Labs  Lab 12/11/19 0500 12/12/19 0418 12/13/19 0500 12/15/19 0630 12/16/19 0449  NA 142 143 142 144 140  K 4.1 3.8 3.8 4.5 3.7  CL 96* 96* 94* 88* 85*  CO2 37* 38* 39* >50* 45*  GLUCOSE 130* 139* 167* 171* 116*  BUN 11 8 5* 6* 5*  CREATININE 0.45 0.42* 0.36* 0.32* 0.48  CALCIUM 8.1* 8.4* 8.4* 8.8* 8.4*  MG 1.9 1.9 1.9 1.9  --   PHOS 4.4  --   --   --   --    GFR: Estimated Creatinine Clearance: 81.1 mL/min (by C-G formula based on SCr of 0.48 mg/dL). Liver Function Tests: Recent Labs  Lab 12/15/19 0630  AST 22  ALT 16  ALKPHOS 98  BILITOT 0.8  PROT 5.7*  ALBUMIN 1.8*   No results for input(s): LIPASE, AMYLASE in the last 168 hours. No results for input(s): AMMONIA in the last 168 hours. Coagulation Profile: No results for input(s): INR, PROTIME in the last 168 hours. Cardiac Enzymes: No results for input(s): CKTOTAL, CKMB, CKMBINDEX, TROPONINI in the last 168 hours. BNP (last 3 results) No results for input(s): PROBNP in the last 8760 hours. HbA1C: No results for input(s): HGBA1C in the last 72 hours. CBG: Recent Labs  Lab 12/16/19 0958 12/16/19 1202 12/16/19 1624 12/17/19 0802 12/17/19 1238  GLUCAP 144* 149* 187* 170* 241*   Lipid Profile: No results for input(s): CHOL, HDL, LDLCALC, TRIG, CHOLHDL, LDLDIRECT in the last 72 hours. Thyroid  Function Tests: No results for input(s): TSH, T4TOTAL, FREET4, T3FREE, THYROIDAB in the last 72 hours. Anemia Panel: No results for input(s): VITAMINB12, FOLATE, FERRITIN, TIBC, IRON, RETICCTPCT in the last 72 hours. Sepsis Labs: No results for input(s): PROCALCITON, LATICACIDVEN in the last 168 hours.  No results found for this or any previous visit (from the past 240 hour(s)).    Radiology Studies: No results found.    Scheduled Meds: . apixaban  5 mg Oral BID  . ascorbic acid  500 mg Oral BID  . atorvastatin  10 mg Oral Daily  . calcium carbonate  1 tablet Oral Daily  . carvedilol  25 mg Oral BID WC  . cholecalciferol  1,000 Units Oral Daily  . divalproex  125 mg Oral QHS  . divalproex  500 mg Oral Daily  . guaiFENesin  600 mg Oral BID  . insulin aspart  0-20 Units Subcutaneous TID WC  . insulin aspart  0-5 Units Subcutaneous QHS  . insulin glargine  10 Units Subcutaneous Daily  . lisinopril  20 mg Oral Daily  . senna-docusate  1 tablet Oral Daily  . sodium chloride flush  10-40 mL Intracatheter Q12H   Continuous Infusions: . ampicillin-sulbactam (UNASYN) IV 3 g (12/17/19 0823)  . vancomycin 1,000 mg (12/17/19 1303)     LOS: 14 days      Time spent: 25 minutes   Noralee Stain, DO [facility] 12/17/2019, 1:31 PM   Available via Epic secure chat 7am-7pm After these hours, please refer to coverage provider listed on [email or web address]

## 2019-12-17 NOTE — Discharge Summary (Signed)
Physician Discharge Summary  Becky Hendricks OMB:[id number] DOB: [birth year] DOA: 12/03/2019  PCP: [name], MD  Admit date: 12/03/2019 Discharge date: 12/17/2019  Disposition:  SNF   Recommendations for Outpatient Follow-up:  1. Follow up with PCP in 1 week 2. Follow up with ID as scheduled on 5/14 3. Follow up with outpatient wound center   Discharge Condition: Stable CODE STATUS: Full  Diet recommendation:  Diet Orders (From admission, onward)    Start     Ordered   12/14/19 1313  DIET SOFT Room service appropriate? No; Fluid consistency: Thin  Diet effective now    Comments: Low fat, carb modified  Question Answer Comment  Room service appropriate? 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 She was noted to have a very large deep sacral wound with purulent drainage. 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  Discharge Diagnoses:  Principal Problem:   Osteomyelitis ([place]) Active Problems:   Acute metabolic encephalopathy   Severe sepsis (HCC)   Abscess of multiple sites of buttock   AF (paroxysmal atrial fibrillation) (HCC)   Hypoxia   Sacral wound   Sepsis with left buttock abscess with purulent drainage, stage IV pressure ulcer, POA -Status post I&D left buttock abscess on4/23and on 4/29, no plan for further surgical intervention, will resume  elliquis, f/u with wound care Dr. Dellia Nims  -Wound culture grew staph warneri and strep anginosis -Blood culture streptococcus viridans only in one of the anaerobic bottle only is considered contamination -Echocardiogram no mention of vegation -Has been on vancomycin,cefepime and Flagyl from 4/22-4/28, now on vanc and unasyn. Plan for 6 weeks of abx with vanc andunasyn:end date on 01/21/2020, f/u with ID in two weeks. Monitor stool texture due to h/o cdiff, low threshold to start oral Vanco perID recommendation -Continue wound care   Acute hypercapnic respiratory failure -Resolved after BiPAP   Acute metabolic encephalopathy versus chronic cognitive deficit -Per brother, patient was living alone at home in December prior to multitude of acute illnesses requiring hospitalizations and SNF placement. Has bipolar at baseline -CT head 4/22: Atrophy, chronic microvascular disease. -Seems to be now her new normal  Paroxysmal A. fib -Eliquis, coreg   Hypertension -Coreg and lisinopril  HLD -Lipitor  Insulin-dependent type 2 diabetes, uncontrolled with hyperglycemia -A1c 7.8 -Lantus, SSI   Bipolar disorder -Prolonged QT noted on EKG therefore quetiapine on hold  -Continue Depakote  Obesity Estimated body mass index is 39.14 kg/m as calculated from the following:   Height as of this encounter: '5\' 6"'  (1.676 m).   Weight as of this encounter: 110 kg.  COVID-19 infection January 2021 -Covid test 4/22 negative    Discharge Instructions  Discharge Instructions    [facility] pharmacist to adjust dose for Vancomycin, Aminoglycosides and other anti-infective therapies as requested by physician.   Complete by: As directed    [facility] to  provide Cath Flo 59m   Complete by: As directed    Administer for PICC line occlusion and as ordered by physician for other access device issues.   Anaphylaxis Kit: Provided to treat any anaphylactic reaction to  the medication being provided to the patient if First Dose or when requested by physician   Complete by: As directed    Epinephrine 172mml vial / amp: Administer 0.60m84m0.60ml39mubcutaneously once for moderate to severe anaphylaxis, nurse to call physician and pharmacy when reaction occurs and call 911 if needed for immediate care   Diphenhydramine 50mg160mIV vial: Administer 25-50mg 62mM PRN for first dose reaction, rash, itching, mild reaction, nurse to call physician and pharmacy when reaction occurs   Sodium Chloride 0.9% NS 500ml I20mdminister if needed for hypovolemic blood pressure drop or as ordered by physician after call to physician with anaphylactic reaction   Change dressing on IV access line weekly and PRN   Complete by: As directed    Discharge wound care:   Complete by: As directed    Wound care: twice daily saline moistened gauze to be inserted into the buttock and sacral wounds, cover with ABD pads, tape in place.   Flush IV access with Sodium Chloride 0.9% and Heparin 10 units/ml or 100 units/ml   Complete by: As directed    Home infusion instructions - [facility]   Complete by: As directed    Instructions: Flush IV access with Sodium Chloride 0.9% and Heparin 10units/ml or 100units/ml   Change dressing on IV access line: Weekly and PRN   Instructions Cath Flo 2mg: Ad40mister for PICC Line occlusion and as ordered by physician for other access device   [facility] pharmacist to adjust dose for: Vancomycin, Aminoglycosides and other anti-infective therapies as requested by physician   Increase activity slowly   Complete by: As directed    Method of administration may be changed at the discretion of home infusion pharmacist based upon assessment of the patient and/or caregiver's ability to self-administer the medication ordered   Complete by: As directed      Allergies as of 12/17/2019      Reactions   Chlorhexidine Gluconate    itching       Medication List    STOP taking these medications   amLODipine 5 MG tablet Commonly known as: NORVASC   cefTRIAXone 1 g in sodium chloride 0.9 % 100 mL   metoprolol succinate 25 MG 24 hr tablet Commonly known as: TOPROL-XL   metoprolol tartrate 25 MG tablet Commonly known as: LOPRESSOR   QUEtiapine 100 MG tablet Commonly known as: SEROQUEL     TAKE these medications   acetaminophen 325 MG tablet Commonly known as: TYLENOL Take 325 mg by mouth every 6 (six) hours as needed for mild pain.   ampicillin-sulbactam  IVPB Commonly known as: UNASYN Inject 3 g into the vein every 6 (six) hours. Indication:  Coccygeal osteo/sacral decubitus ulcer  First Dose: Yes Last Day of Therapy:  01/21/20 Labs - Once weekly:  CBC/D and BMP, Labs - Every other week:  ESR and CRP Method of administration: Mini-Bag Plus / Gravity Method of administration may be changed at the discretion of home infusion pharmacist based upon assessment of the patient and/or caregiver's ability to self-administer the medication ordered.   apixaban 5 MG Tabs tablet Commonly known as: ELIQUIS Take 5 mg by mouth 2 (two) times daily.   ascorbic acid 1000 MG tablet Commonly known as: VITAMIN  C Take 500 mg by mouth 2 (two) times daily.   atorvastatin 10 MG tablet Commonly known as: LIPITOR Take 10 mg by mouth daily.   calcium carbonate 500 MG chewable tablet Commonly known as: TUMS - dosed in mg elemental calcium Chew 1 tablet by mouth daily.   carvedilol 25 MG tablet Commonly known as: COREG Take 1 tablet (25 mg total) by mouth 2 (two) times daily with a meal.   cholecalciferol 25 MCG (1000 UNIT) tablet Commonly known as: VITAMIN D3 Take 1,000 Units by mouth daily.   Depakote 500 MG DR tablet Generic drug: divalproex Take 500 mg by mouth daily.   divalproex 125 MG capsule Commonly known as: DEPAKOTE SPRINKLE Take 125 mg by mouth at bedtime.   Fish Oil 1200 MG Caps Take 1,200 mg by mouth daily.    HumaLOG 100 UNIT/ML cartridge Generic drug: insulin lispro Inject into the skin 2 (two) times daily. Units not provided   Ipratropium-Albuterol 20-100 MCG/ACT Aers respimat Commonly known as: COMBIVENT Inhale 1 puff into the lungs every 6 (six) hours.   Janumet 50-1000 MG tablet Generic drug: sitaGLIPtin-metformin Take 1 tablet by mouth 2 (two) times daily.   Lantus 100 UNIT/ML injection Generic drug: insulin glargine Inject 13 Units into the skin daily.   lisinopril 20 MG tablet Commonly known as: ZESTRIL Take 20 mg by mouth daily.   sennosides-docusate sodium 8.6-50 MG tablet Commonly known as: SENOKOT-S Take 1 tablet by mouth daily.   vancomycin  IVPB Inject 1,000 mg into the vein every 12 (twelve) hours. Indication:  Coccygeal osteo/sacral decubitus ulcer  First Dose: Yes Last Day of Therapy:  01/21/2020 Labs - "Sunday/Monday:  CBC/D, BMP, and vancomycin trough. Labs - Thursday:  BMP and vancomycin trough Labs - Every other week:  ESR and CRP Method of administration:Elastomeric Method of administration may be changed at the discretion of the patient and/or caregiver's ability to self-administer the medication ordered.   Victoza 18 MG/3ML Sopn Generic drug: liraglutide Inject 1.8 mg into the skin daily.            Discharge Care Instructions  (From admission, onward)         Start     Ordered   12/17/19 0000  Change dressing on IV access line weekly and PRN  (Home infusion instructions - [facility] )     05" /06/21 1428   12/17/19 0000  Discharge wound care:    Comments: Wound care: twice daily saline moistened gauze to be inserted into the buttock and sacral wounds, cover with ABD pads, tape in place.   12/17/19 1430         Follow-up Information    [facility] for Infectious Disease Follow up on 12/25/2019.   Specialty: Infectious Diseases Why: TELEPHONE APPOINTMENT with Janene Madeira, NP @ 10:15 am. Clinic will call before  appt time to speak with the patient's nurse.  Please fax ordered labs to [phone].  Contact information: [place], [place] [id number] [place] [facility]             . Schedule an appointment as soon as possible for a visit in 2 week(s).   Contact information: [address] [phone]       [name], MD. Schedule an appointment as soon as possible for a visit in 1 week(s).   Specialty:  Internal Medicine Contact information: [address] [place] [address] [phone]          Allergies  Allergen Reactions  . Chlorhexidine Gluconate     itching    Consultations:  General surgery  ID    Procedures/Studies: CT HEAD WO CONTRAST  Result Date: 12/03/2019 CLINICAL DATA:  Altered mental status EXAM: CT HEAD WITHOUT CONTRAST TECHNIQUE: Contiguous axial images were obtained from the base of the skull through the vertex without intravenous contrast. COMPARISON:  09/07/2019 FINDINGS: Brain: There is atrophy and chronic small vessel disease changes. No acute intracranial abnormality. Specifically, no hemorrhage, hydrocephalus, mass lesion, acute infarction, or significant intracranial injury. Vascular: No hyperdense vessel or unexpected calcification. Skull: No acute calvarial abnormality. Sinuses/Orbits: Visualized paranasal sinuses and mastoids clear. Orbital soft tissues unremarkable. Other: None IMPRESSION: Atrophy, chronic microvascular disease. No acute intracranial abnormality. Electronically Signed   By: Rolm Baptise M.D.   On: 12/03/2019 22:32   CT ABDOMEN PELVIS W CONTRAST  Result Date: 12/03/2019 CLINICAL DATA:  74 year old female with abdominal pain. Concern for abscess or infection. EXAM: CT ABDOMEN AND PELVIS WITH CONTRAST TECHNIQUE: Multidetector CT imaging of the abdomen and pelvis was  performed using the standard protocol following bolus administration of intravenous contrast. CONTRAST:  11m OMNIPAQUE IOHEXOL 300 MG/ML  SOLN COMPARISON:  None. FINDINGS: Evaluation is limited due to streak artifact caused by patient's arms. Lower chest: Patchy area of consolidative change involving the right lung base and portion of the right middle lobe with air bronchogram may represent atelectasis or infiltrate. Clinical correlation is recommended. There is no intra-abdominal free air or free fluid. Hepatobiliary: The liver is unremarkable. No intrahepatic biliary ductal dilatation. Multiple stones noted within the gallbladder. No pericholecystic fluid or evidence of acute cholecystitis by CT. Pancreas: Unremarkable. No pancreatic ductal dilatation or surrounding inflammatory changes. Spleen: Normal in size without focal abnormality. Adrenals/Urinary Tract: The adrenal glands unremarkable. There is no hydronephrosis on either side. There is symmetric enhancement and excretion of contrast by both kidneys. There is a 1 cm exophytic hypodense lesion from the upper pole of the right kidney which is suboptimally characterized but likely represents a cyst. The visualized ureters appear unremarkable. The urinary bladder is collapsed. Stomach/Bowel: There is moderate stool throughout the colon. There is no bowel obstruction or active inflammation. The appendix is not visualized with certainty. No inflammatory changes identified in the right lower quadrant. Vascular/Lymphatic: Mild aortoiliac atherosclerotic disease. The IVC is unremarkable. No portal venous gas. There is no adenopathy. Reproductive: The uterus is anteverted. Multiple fibroids measure up to 4.5 cm from the posterior uterus. No adnexal masses. Other: There is a 4 x 8 x 12 cm complex collection with small pockets of air in the superficial subcutaneous soft tissues of the left buttock along the inferior aspect of the gluteus maximus consistent with an  infected collection or abscess. This collection and extends to the coccyx. There is slight fragmentation of the coccygeal bone most concerning for osteomyelitis. A smaller complex collection with pockets of air noted superficial to the sacrum to the right of the midline measuring approximately 0.6 x 1.2 cm. There is ulceration of the skin over this collection. Musculoskeletal: Osteopenia with degenerative changes of the spine. Fragmentation of the distal sacrum/coccygeal bone as above most concerning for osteomyelitis. IMPRESSION: 1. 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 Clinical correlation is recommended. 2. Cholelithiasis. 3. No  bowel obstruction. 4. Aortic Atherosclerosis (ICD10-I70.0). 5. Right lung base atelectasis or pneumonia. Electronically Signed   By: Anner Crete M.D.   On: 12/03/2019 18:52   DG Chest Port 1 View  Result Date: 12/14/2019 CLINICAL DATA:  Tachypnea. Evaluate for pneumonia. EXAM: PORTABLE CHEST 1 VIEW COMPARISON:  Radiographs 12/09/2019 and 12/03/2019. Abdominal CT 12/03/2019. FINDINGS: 1250 hours. Increased patient rotation to the left. The heart size and mediastinal contours are stable. There is persistent elevation of the right hemidiaphragm with associated right basilar airspace disease and a possible small right pleural effusion. Mild atelectasis is present at the left lung base. No edema or pneumothorax. The bones appear unchanged. IMPRESSION: Persistent right basilar infiltrate versus atelectasis with possible small right pleural effusion. Electronically Signed   By: Richardean Sale M.D.   On: 12/14/2019 13:07   DG CHEST PORT 1 VIEW  Result Date: 12/09/2019 CLINICAL DATA:  Hypoxia EXAM: PORTABLE CHEST 1 VIEW COMPARISON:  12/03/2019 FINDINGS: Elevation of the right hemidiaphragm. Right base atelectasis. No confluent opacity on the left. Mild  cardiomegaly. No effusions or edema. No acute bony abnormality. IMPRESSION: Stable elevation of the right hemidiaphragm with right base atelectasis. Electronically Signed   By: Rolm Baptise M.D.   On: 12/09/2019 10:51   DG Chest Port 1 View  Result Date: 12/03/2019 CLINICAL DATA:  Altered mental status. EXAM: PORTABLE CHEST 1 VIEW COMPARISON:  Single-view of the chest 09/18/2019 and 09/07/2019. FINDINGS: Elevation of the right hemidiaphragm relative to the left is new since the prior examinations and likely due to a subpulmonic effusion. Associated basilar airspace disease is noted. The left lung is expanded and clear. No left effusion. Heart size is normal. IMPRESSION: Findings most suggestive of a subpulmonic right pleural effusion with associated airspace disease which could be due to atelectasis or pneumonia. Electronically Signed   By: Inge Rise M.D.   On: 12/03/2019 13:47   ECHOCARDIOGRAM COMPLETE  Result Date: 12/08/2019    ECHOCARDIOGRAM REPORT   Patient Name:   SWANNIE MILIUS Date of Exam: 12/08/2019 Medical Rec #:  [id number]      Height:       66.0 in Accession #:    [id number]     Weight:       242.5 lb Date of Birth:  [birth year]       BSA:          2.170 m Patient Age:    18 years       BP:           150/120 mmHg Patient Gender: F              HR:           96 bpm. Exam Location:  Inpatient Procedure: 2D Echo, Cardiac Doppler and Color Doppler Indications:    Endocarditis I38  History:        Patient has no prior history of Echocardiogram examinations.                 Arrythmias:Atrial Fibrillation; Risk Factors:Hypertension and                 Diabetes. Sepsis. Oseomyelitis.  Sonographer:    Clayton Lefort RDCS (AE) Referring Phys: [id number] JEFFREY [facility]  Sonographer Comments: Limited patient mobility. IMPRESSIONS  1. Normal LV systolic function; grade 1 diastolic dysfunction; mild LVH.  2. Left ventricular ejection fraction, by estimation, is 60 to 65%. The left ventricle has normal function.  The left ventricle has no regional  wall motion abnormalities. There is mild left ventricular hypertrophy. Left ventricular diastolic parameters are consistent with Grade I diastolic dysfunction (impaired relaxation).  3. Right ventricular systolic function is normal. The right ventricular size is normal. There is mildly elevated pulmonary artery systolic pressure.  4. The mitral valve is normal in structure. Trivial mitral valve regurgitation. No evidence of mitral stenosis.  5. The aortic valve is tricuspid. Aortic valve regurgitation is not visualized. No aortic stenosis is present.  6. The inferior vena cava is normal in size with greater than 50% respiratory variability, suggesting right atrial pressure of 3 mmHg. FINDINGS  Left Ventricle: Left ventricular ejection fraction, by estimation, is 60 to 65%. The left ventricle has normal function. The left ventricle has no regional wall motion abnormalities. The left ventricular internal cavity size was normal in size. There is  mild left ventricular hypertrophy. Left ventricular diastolic parameters are consistent with Grade I diastolic dysfunction (impaired relaxation). Right Ventricle: The right ventricular size is normal.Right ventricular systolic function is normal. There is mildly elevated pulmonary artery systolic pressure. The tricuspid regurgitant velocity is 2.90 m/s, and with an assumed right atrial pressure of  3 mmHg, the estimated right ventricular systolic pressure is 97.3 mmHg. Left Atrium: Left atrial size was normal in size. Right Atrium: Right atrial size was normal in size. Pericardium: There is no evidence of pericardial effusion. Mitral Valve: The mitral valve is normal in structure. Normal mobility of the mitral valve leaflets. Trivial mitral valve regurgitation. No evidence of mitral valve stenosis. Tricuspid Valve: The tricuspid valve is normal in structure. Tricuspid valve regurgitation is mild . No evidence of tricuspid stenosis. Aortic  Valve: The aortic valve is tricuspid. Aortic valve regurgitation is not visualized. No aortic stenosis is present. Pulmonic Valve: The pulmonic valve was normal in structure. Pulmonic valve regurgitation is trivial. No evidence of pulmonic stenosis. Aorta: The aortic root is normal in size and structure. Venous: The inferior vena cava is normal in size with greater than 50% respiratory variability, suggesting right atrial pressure of 3 mmHg. IAS/Shunts: No atrial level shunt detected by color flow Doppler. Additional Comments: Normal LV systolic function; grade 1 diastolic dysfunction; mild LVH.  LEFT VENTRICLE PLAX 2D LVIDd:         4.56 cm LVIDs:         3.01 cm LV PW:         1.22 cm LV IVS:        1.42 cm LVOT diam:     2.00 cm LV SV:         79 LV SV Index:   36 LVOT Area:     3.14 cm  RIGHT VENTRICLE          IVC RV Basal diam:  2.18 cm  IVC diam: 1.52 cm TAPSE (M-mode): 2.1 cm LEFT ATRIUM             Index       RIGHT ATRIUM          Index LA diam:        4.20 cm 1.94 cm/m  RA Area:     8.59 cm LA Vol (A2C):   51.9 ml 23.91 ml/m RA Volume:   14.80 ml 6.82 ml/m LA Vol (A4C):   44.0 ml 20.27 ml/m LA Biplane Vol: 49.1 ml 22.62 ml/m  AORTIC VALVE LVOT Vmax:   128.60 cm/s LVOT Vmean:  72.060 cm/s LVOT VTI:    0.251 m  AORTA Ao Root diam: 3.10  cm Ao Asc diam:  3.80 cm TRICUSPID VALVE TR Peak grad:   33.6 mmHg TR Vmax:        290.00 cm/s  SHUNTS Systemic VTI:  0.25 m Systemic Diam: 2.00 cm Kirk Ruths MD Electronically signed by Kirk Ruths MD Signature Date/Time: 12/08/2019/1:14:56 PM    Final       Discharge Exam: Vitals:   12/17/19 0425 12/17/19 0814  BP: (!) 148/76 (!) 154/87  Pulse: (!) 54 (!) 57  Resp:    Temp: 97.7 F (36.5 C) 97.8 F (36.6 C)  SpO2: 98% 91%    General exam: Appears calm and comfortable  Respiratory system: Clear to auscultation. Respiratory effort normal.  Cardiovascular system: S1 & S2 heard, RRR. No pedal edema. Gastrointestinal system: Abdomen is  nondistended, soft and nontender. Normal bowel sounds heard. Central nervous system: Alert  Extremities: Symmetric in appearance bilaterally  Psychiatry: Poor historian     The results of significant diagnostics from this hospitalization (including imaging, microbiology, ancillary and laboratory) are listed below for reference.     Microbiology: No results found for this or any previous visit (from the past 240 hour(s)).   Labs: BNP (last 3 results) Recent Labs    09/07/19 1950  BNP 42.5   Basic Metabolic Panel: Recent Labs  Lab 12/11/19 0500 12/12/19 0418 12/13/19 0500 12/15/19 0630 12/16/19 0449  NA 142 143 142 144 140  K 4.1 3.8 3.8 4.5 3.7  CL 96* 96* 94* 88* 85*  CO2 37* 38* 39* >50* 45*  GLUCOSE 130* 139* 167* 171* 116*  BUN 11 8 5* 6* 5*  CREATININE 0.45 0.42* 0.36* 0.32* 0.48  CALCIUM 8.1* 8.4* 8.4* 8.8* 8.4*  MG 1.9 1.9 1.9 1.9  --   PHOS 4.4  --   --   --   --    Liver Function Tests: Recent Labs  Lab 12/15/19 0630  AST 22  ALT 16  ALKPHOS 98  BILITOT 0.8  PROT 5.7*  ALBUMIN 1.8*   No results for input(s): LIPASE, AMYLASE in the last 168 hours. No results for input(s): AMMONIA in the last 168 hours. CBC: Recent Labs  Lab 12/11/19 0500 12/12/19 0418 12/13/19 0500 12/15/19 0630 12/16/19 0449  WBC 11.4* 11.6* 11.3* 12.0* 10.8*  NEUTROABS 7.8* 7.6  --   --   --   HGB 8.1* 8.6* 8.4* 9.2* 8.6*  HCT 28.8* 30.3* 30.1* 33.8* 29.5*  MCV 98.6 97.1 97.1 102.4* 97.0  PLT 311 344 344 350 266   Cardiac Enzymes: No results for input(s): CKTOTAL, CKMB, CKMBINDEX, TROPONINI in the last 168 hours. BNP: Invalid input(s): POCBNP CBG: Recent Labs  Lab 12/16/19 0958 12/16/19 1202 12/16/19 1624 12/17/19 0802 12/17/19 1238  GLUCAP 144* 149* 187* 170* 241*   D-Dimer No results for input(s): DDIMER in the last 72 hours. Hgb A1c No results for input(s): HGBA1C in the last 72 hours. Lipid Profile No results for input(s): CHOL, HDL, LDLCALC, TRIG,  CHOLHDL, LDLDIRECT in the last 72 hours. Thyroid function studies No results for input(s): TSH, T4TOTAL, T3FREE, THYROIDAB in the last 72 hours.  Invalid input(s): FREET3 Anemia work up No results for input(s): VITAMINB12, FOLATE, FERRITIN, TIBC, IRON, RETICCTPCT in the last 72 hours. Urinalysis    Component Value Date/Time   COLORURINE YELLOW 12/03/2019 1700   APPEARANCEUR CLEAR 12/03/2019 1700   LABSPEC 1.017 12/03/2019 1700   PHURINE 5.0 12/03/2019 1700   GLUCOSEU NEGATIVE 12/03/2019 1700   HGBUR NEGATIVE 12/03/2019 1700   BILIRUBINUR NEGATIVE  12/03/2019 1700   KETONESUR 5 (A) 12/03/2019 1700   PROTEINUR NEGATIVE 12/03/2019 1700   NITRITE NEGATIVE 12/03/2019 1700   LEUKOCYTESUR NEGATIVE 12/03/2019 1700   Sepsis Labs Invalid input(s): PROCALCITONIN,  WBC,  LACTICIDVEN Microbiology No results found for this or any previous visit (from the past 240 hour(s)).   Patient was seen and examined on the day of discharge and was found to be in stable condition. Time coordinating discharge: 35 minutes including assessment and coordination of care, as well as examination of the patient.   SIGNED:  Dessa Phi, DO [facility] 12/17/2019, 2:30 PM

## 2019-12-17 NOTE — Progress Notes (Signed)
Pharmacy Antibiotic Note  Becky Hendricks is a 74 y.o. female admitted on 12/03/2019 with sepsis.  Patient has purulent sacral wound and was taking CTX for 1 week prior. Has hx of recurrent UTI. Pharmacy has been consulted for Vancomycin dosing. Cefepime and flagyl transitioned to Unasyn. S/p I&D of the sacral wound (last on 4/29).   The patient had a Vancomycin trough today that resulted at 19 mcg/ml. The AM dose was noted to be given 2 hours late so the correct trough is close to ~16 mcg/ml and at goal. Will continue with the current dosing for now.   Plan: Continue Vancomycin 1000 mg IV Q12 hrs  Unasyn 3g IV q6h Monitor clinical progress, c/s, renal function Stop date 6/10 per ID, vancomycin levels as indicated   Height: 5\' 6"  (167.6 cm) Weight: 110 kg (242 lb 8.1 oz) IBW/kg (Calculated) : 59.3  Temp (24hrs), Avg:98.1 F (36.7 C), Min:97.7 F (36.5 C), Max:98.6 F (37 C)  Recent Labs  Lab 12/11/19 0500 12/12/19 0418 12/13/19 0500 12/13/19 0935 12/15/19 0630 12/16/19 0449  WBC 11.4* 11.6* 11.3*  --  12.0* 10.8*  CREATININE 0.45 0.42* 0.36*  --  0.32* 0.48  VANCOTROUGH  --   --   --  8*  --   --     Estimated Creatinine Clearance: 81.1 mL/min (by C-G formula based on SCr of 0.48 mg/dL).    Allergies  Allergen Reactions  . Chlorhexidine Gluconate     itching    Antimicrobials this admission: Vanc 4/22 >> (6/10) Flagyl 4/22 x1; 4/24 >>4/28 Cefepime 4/22 >>4/28 Unasyn 4/28 >> (6/10)  Dose adjustments this admission: 4/25 VT>>15 - continue 5/2 VT 8 - adjust to 1g IV every 12 hours  5/6 corrected VT~16 - cont current dose  Microbiology results: 4/22 BC: 1/3 viridan strep 4/22 UCx >>neg 4/23 L-buttock abscess: rare staphylococcus warneri, strep anginosis 4/23 MRSA pcr +  Thank you for allowing pharmacy to be a part of this patient's care.  5/23, PharmD, BCPS Clinical Pharmacist Clinical phone for 12/17/2019: 02/16/2020 12/17/2019 11:50 AM   **Pharmacist  phone directory can now be found on [email or web address] ([email or web address]).  Listed under [facility].

## 2019-12-17 NOTE — TOC Transition Note (Addendum)
Transition of Care [facility]) - CM/SW Discharge Note   Patient Details  Name: Becky Hendricks MRN: [id number] Date of Birth: May 06, 1946  Transition of Care [facility]) CM/SW Contact:  Erin Sons, LCSW Phone Number: 12/17/2019, 3:38 PM   Clinical Narrative:    CSW received auth from [place]. Reference # [id number]. Berkley Harvey # [id number] approved 5/6 -- 5/10   Patient will DC to: [name] Anticipated DC date: 12/17/19 Family notified: jerah, esty (Brother) [phone] [facility] Phone) Transport by: Sharin Mons   Per MD patient ready for DC to [facility] . RN, patient, patient's family, and facility notified of DC. Discharge Summary and FL2 sent to facility. RN to call report prior to discharge [phone] Room 908P). DC packet on chart. Ambulance transport requested for patient.   CSW will sign off for now as social work intervention is no longer needed. Please consult [place] again if new needs arise.      Barriers to Discharge: Continued Medical Work up   Patient Goals and CMS Choice Patient states their goals for this hospitalization and ongoing recovery are:: Pt is unable to verbalize goals. CMS Medicare.gov Compare Post Acute Care list provided to:: Patient Represenative (must comment)(Brother- [name]) Choice offered to / list presented to : Patient, Sibling  Discharge Placement                       Discharge Plan and Services                                     Social Determinants of Health (SDOH) Interventions     Readmission Risk Interventions No flowsheet data found.

## 2019-12-18 ENCOUNTER — Telehealth: Payer: Medicare Other | Admitting: Infectious Diseases

## 2019-12-20 ENCOUNTER — Inpatient Hospital Stay (HOSPITAL_COMMUNITY)
Admission: EM | Admit: 2019-12-20 | Discharge: 2020-01-06 | DRG: 871 | Disposition: A | Discharge status: Skilled Nursing Facility | Payer: Medicare Other | Source: Skilled Nursing Facility | Attending: Internal Medicine | Admitting: Internal Medicine

## 2019-12-20 ENCOUNTER — Other Ambulatory Visit: Payer: Self-pay

## 2019-12-20 ENCOUNTER — Encounter (HOSPITAL_COMMUNITY): Payer: Self-pay | Admitting: *Deleted

## 2019-12-20 ENCOUNTER — Inpatient Hospital Stay (HOSPITAL_COMMUNITY): Payer: Medicare Other

## 2019-12-20 ENCOUNTER — Emergency Department (HOSPITAL_COMMUNITY): Payer: Medicare Other

## 2019-12-20 DIAGNOSIS — Z6835 Body mass index (BMI) 35.0-35.9, adult: Secondary | ICD-10-CM

## 2019-12-20 DIAGNOSIS — Z7901 Long term (current) use of anticoagulants: Secondary | ICD-10-CM

## 2019-12-20 DIAGNOSIS — A408 Other streptococcal sepsis: Secondary | ICD-10-CM | POA: Diagnosis present

## 2019-12-20 DIAGNOSIS — R6520 Severe sepsis without septic shock: Secondary | ICD-10-CM

## 2019-12-20 DIAGNOSIS — R6521 Severe sepsis with septic shock: Secondary | ICD-10-CM | POA: Diagnosis present

## 2019-12-20 DIAGNOSIS — I480 Paroxysmal atrial fibrillation: Secondary | ICD-10-CM | POA: Diagnosis present

## 2019-12-20 DIAGNOSIS — E1165 Type 2 diabetes mellitus with hyperglycemia: Secondary | ICD-10-CM | POA: Diagnosis present

## 2019-12-20 DIAGNOSIS — L89159 Pressure ulcer of sacral region, unspecified stage: Secondary | ICD-10-CM | POA: Diagnosis present

## 2019-12-20 DIAGNOSIS — Z794 Long term (current) use of insulin: Secondary | ICD-10-CM

## 2019-12-20 DIAGNOSIS — Z7189 Other specified counseling: Secondary | ICD-10-CM

## 2019-12-20 DIAGNOSIS — J9621 Acute and chronic respiratory failure with hypoxia: Secondary | ICD-10-CM | POA: Diagnosis present

## 2019-12-20 DIAGNOSIS — E876 Hypokalemia: Secondary | ICD-10-CM | POA: Diagnosis present

## 2019-12-20 DIAGNOSIS — L89324 Pressure ulcer of left buttock, stage 4: Secondary | ICD-10-CM | POA: Diagnosis present

## 2019-12-20 DIAGNOSIS — I248 Other forms of acute ischemic heart disease: Secondary | ICD-10-CM | POA: Diagnosis present

## 2019-12-20 DIAGNOSIS — F319 Bipolar disorder, unspecified: Secondary | ICD-10-CM | POA: Diagnosis present

## 2019-12-20 DIAGNOSIS — J9691 Respiratory failure, unspecified with hypoxia: Secondary | ICD-10-CM | POA: Diagnosis present

## 2019-12-20 DIAGNOSIS — A419 Sepsis, unspecified organism: Secondary | ICD-10-CM | POA: Diagnosis present

## 2019-12-20 DIAGNOSIS — S80212A Abrasion, left knee, initial encounter: Secondary | ICD-10-CM | POA: Diagnosis present

## 2019-12-20 DIAGNOSIS — R600 Localized edema: Secondary | ICD-10-CM

## 2019-12-20 DIAGNOSIS — E872 Acidosis: Secondary | ICD-10-CM | POA: Diagnosis present

## 2019-12-20 DIAGNOSIS — E46 Unspecified protein-calorie malnutrition: Secondary | ICD-10-CM | POA: Diagnosis present

## 2019-12-20 DIAGNOSIS — L89314 Pressure ulcer of right buttock, stage 4: Secondary | ICD-10-CM | POA: Diagnosis present

## 2019-12-20 DIAGNOSIS — G9341 Metabolic encephalopathy: Secondary | ICD-10-CM | POA: Diagnosis present

## 2019-12-20 DIAGNOSIS — I361 Nonrheumatic tricuspid (valve) insufficiency: Secondary | ICD-10-CM | POA: Diagnosis not present

## 2019-12-20 DIAGNOSIS — M86159 Other acute osteomyelitis, unspecified femur: Secondary | ICD-10-CM | POA: Diagnosis not present

## 2019-12-20 DIAGNOSIS — J9600 Acute respiratory failure, unspecified whether with hypoxia or hypercapnia: Secondary | ICD-10-CM | POA: Diagnosis not present

## 2019-12-20 DIAGNOSIS — I469 Cardiac arrest, cause unspecified: Secondary | ICD-10-CM | POA: Diagnosis present

## 2019-12-20 DIAGNOSIS — I4891 Unspecified atrial fibrillation: Secondary | ICD-10-CM | POA: Diagnosis present

## 2019-12-20 DIAGNOSIS — Z452 Encounter for adjustment and management of vascular access device: Secondary | ICD-10-CM | POA: Diagnosis not present

## 2019-12-20 DIAGNOSIS — R109 Unspecified abdominal pain: Secondary | ICD-10-CM

## 2019-12-20 DIAGNOSIS — R21 Rash and other nonspecific skin eruption: Secondary | ICD-10-CM | POA: Diagnosis present

## 2019-12-20 DIAGNOSIS — M4628 Osteomyelitis of vertebra, sacral and sacrococcygeal region: Secondary | ICD-10-CM | POA: Diagnosis present

## 2019-12-20 DIAGNOSIS — Z515 Encounter for palliative care: Secondary | ICD-10-CM | POA: Diagnosis not present

## 2019-12-20 DIAGNOSIS — E1169 Type 2 diabetes mellitus with other specified complication: Secondary | ICD-10-CM | POA: Diagnosis present

## 2019-12-20 DIAGNOSIS — M8610 Other acute osteomyelitis, unspecified site: Secondary | ICD-10-CM | POA: Diagnosis present

## 2019-12-20 DIAGNOSIS — J9620 Acute and chronic respiratory failure, unspecified whether with hypoxia or hypercapnia: Secondary | ICD-10-CM | POA: Diagnosis present

## 2019-12-20 DIAGNOSIS — I471 Supraventricular tachycardia: Secondary | ICD-10-CM | POA: Diagnosis present

## 2019-12-20 DIAGNOSIS — Z20822 Contact with and (suspected) exposure to covid-19: Secondary | ICD-10-CM | POA: Diagnosis present

## 2019-12-20 DIAGNOSIS — J9601 Acute respiratory failure with hypoxia: Secondary | ICD-10-CM | POA: Diagnosis not present

## 2019-12-20 DIAGNOSIS — L270 Generalized skin eruption due to drugs and medicaments taken internally: Secondary | ICD-10-CM | POA: Diagnosis not present

## 2019-12-20 DIAGNOSIS — L8990 Pressure ulcer of unspecified site, unspecified stage: Secondary | ICD-10-CM | POA: Diagnosis present

## 2019-12-20 DIAGNOSIS — L89154 Pressure ulcer of sacral region, stage 4: Secondary | ICD-10-CM | POA: Diagnosis present

## 2019-12-20 DIAGNOSIS — L89304 Pressure ulcer of unspecified buttock, stage 4: Secondary | ICD-10-CM | POA: Diagnosis not present

## 2019-12-20 DIAGNOSIS — L0231 Cutaneous abscess of buttock: Secondary | ICD-10-CM | POA: Diagnosis present

## 2019-12-20 DIAGNOSIS — R0602 Shortness of breath: Secondary | ICD-10-CM

## 2019-12-20 DIAGNOSIS — Z79899 Other long term (current) drug therapy: Secondary | ICD-10-CM

## 2019-12-20 DIAGNOSIS — M869 Osteomyelitis, unspecified: Secondary | ICD-10-CM | POA: Diagnosis present

## 2019-12-20 DIAGNOSIS — E669 Obesity, unspecified: Secondary | ICD-10-CM | POA: Diagnosis present

## 2019-12-20 DIAGNOSIS — L299 Pruritus, unspecified: Secondary | ICD-10-CM | POA: Diagnosis present

## 2019-12-20 DIAGNOSIS — I10 Essential (primary) hypertension: Secondary | ICD-10-CM | POA: Diagnosis present

## 2019-12-20 DIAGNOSIS — K8020 Calculus of gallbladder without cholecystitis without obstruction: Secondary | ICD-10-CM | POA: Diagnosis present

## 2019-12-20 DIAGNOSIS — J189 Pneumonia, unspecified organism: Secondary | ICD-10-CM

## 2019-12-20 DIAGNOSIS — D638 Anemia in other chronic diseases classified elsewhere: Secondary | ICD-10-CM | POA: Diagnosis present

## 2019-12-20 LAB — POCT I-STAT EG7
Acid-Base Excess: 17 mmol/L — ABNORMAL HIGH (ref 0.0–2.0)
Acid-Base Excess: 20 mmol/L — ABNORMAL HIGH (ref 0.0–2.0)
Bicarbonate: 46.5 mmol/L — ABNORMAL HIGH (ref 20.0–28.0)
Bicarbonate: 43.1 mmol/L — ABNORMAL HIGH (ref 20.0–28.0)
Calcium, Ion: 0.97 mmol/L — ABNORMAL LOW (ref 1.15–1.40)
Calcium, Ion: 1.02 mmol/L — ABNORMAL LOW (ref 1.15–1.40)
HCT: 34 % — ABNORMAL LOW (ref 36.0–46.0)
HCT: 32 % — ABNORMAL LOW (ref 36.0–46.0)
Hemoglobin: 11.6 g/dL — ABNORMAL LOW (ref 12.0–15.0)
Hemoglobin: 10.9 g/dL — ABNORMAL LOW (ref 12.0–15.0)
O2 Saturation: 99 %
O2 Saturation: 70 %
Patient temperature: 37.7
Potassium: 3.6 mmol/L (ref 3.5–5.1)
Potassium: 2.2 mmol/L — CL (ref 3.5–5.1)
Sodium: 134 mmol/L — ABNORMAL LOW (ref 135–145)
Sodium: 137 mmol/L (ref 135–145)
TCO2: 49 mmol/L — ABNORMAL HIGH (ref 22–32)
TCO2: 44 mmol/L — ABNORMAL HIGH (ref 22–32)
pCO2, Ven: 83.4 mmHg (ref 44.0–60.0)
pCO2, Ven: 41.9 mmHg — ABNORMAL LOW (ref 44.0–60.0)
pH, Ven: 7.354 (ref 7.250–7.430)
pH, Ven: 7.622 (ref 7.250–7.430)
pO2, Ven: 138 mmHg — ABNORMAL HIGH (ref 32.0–45.0)
pO2, Ven: 31 mmHg — CL (ref 32.0–45.0)

## 2019-12-20 LAB — GLUCOSE, CAPILLARY
Glucose-Capillary: 130 mg/dL — ABNORMAL HIGH (ref 70–99)
Glucose-Capillary: 169 mg/dL — ABNORMAL HIGH (ref 70–99)

## 2019-12-20 LAB — BASIC METABOLIC PANEL
Anion gap: 17 — ABNORMAL HIGH (ref 5–15)
BUN: 23 mg/dL (ref 8–23)
CO2: 34 mmol/L — ABNORMAL HIGH (ref 22–32)
Calcium: 7.7 mg/dL — ABNORMAL LOW (ref 8.9–10.3)
Chloride: 87 mmol/L — ABNORMAL LOW (ref 98–111)
Creatinine, Ser: 0.61 mg/dL (ref 0.44–1.00)
GFR calc Af Amer: >60 mL/min (ref >60)
GFR calc non Af Amer: >60 mL/min (ref >60)
Glucose, Bld: 194 mg/dL — ABNORMAL HIGH (ref 70–99)
Potassium: 2.6 mmol/L — CL (ref 3.5–5.1)
Sodium: 138 mmol/L (ref 135–145)

## 2019-12-20 LAB — CBC WITH DIFFERENTIAL/PLATELET
Abs Immature Granulocytes: 0.25 10*3/uL — ABNORMAL HIGH (ref 0.00–0.07)
Basophils Absolute: 0.1 10*3/uL (ref 0.0–0.1)
Basophils Relative: 1 %
Eosinophils Absolute: 0.1 10*3/uL (ref 0.0–0.5)
Eosinophils Relative: 1 %
HCT: 36 % (ref 36.0–46.0)
Hemoglobin: 10.2 g/dL — ABNORMAL LOW (ref 12.0–15.0)
Immature Granulocytes: 2 %
Lymphocytes Relative: 17 %
Lymphs Abs: 1.9 10*3/uL (ref 0.7–4.0)
MCH: 27.8 pg (ref 26.0–34.0)
MCHC: 28.3 g/dL — ABNORMAL LOW (ref 30.0–36.0)
MCV: 98.1 fL (ref 80.0–100.0)
Monocytes Absolute: 0.7 10*3/uL (ref 0.1–1.0)
Monocytes Relative: 6 %
Neutro Abs: 8.4 10*3/uL — ABNORMAL HIGH (ref 1.7–7.7)
Neutrophils Relative %: 73 %
Platelets: 244 10*3/uL (ref 150–400)
RBC: 3.67 MIL/uL — ABNORMAL LOW (ref 3.87–5.11)
RDW: 19.8 % — ABNORMAL HIGH (ref 11.5–15.5)
WBC: 11.3 10*3/uL — ABNORMAL HIGH (ref 4.0–10.5)
nRBC: 0.4 % — ABNORMAL HIGH (ref 0.0–0.2)

## 2019-12-20 LAB — LACTIC ACID, PLASMA
Lactic Acid, Venous: 6 mmol/L (ref 0.5–1.9)
Lactic Acid, Venous: 3.9 mmol/L (ref 0.5–1.9)
Lactic Acid, Venous: 3.2 mmol/L (ref 0.5–1.9)

## 2019-12-20 LAB — COMPREHENSIVE METABOLIC PANEL
ALT: 19 U/L (ref 0–44)
AST: 34 U/L (ref 15–41)
Albumin: 1.9 g/dL — ABNORMAL LOW (ref 3.5–5.0)
Alkaline Phosphatase: 77 U/L (ref 38–126)
Anion gap: 16 — ABNORMAL HIGH (ref 5–15)
BUN: 22 mg/dL (ref 8–23)
CO2: 38 mmol/L — ABNORMAL HIGH (ref 22–32)
Calcium: 7.9 mg/dL — ABNORMAL LOW (ref 8.9–10.3)
Chloride: 84 mmol/L — ABNORMAL LOW (ref 98–111)
Creatinine, Ser: 0.74 mg/dL (ref 0.44–1.00)
GFR calc Af Amer: >60 mL/min (ref >60)
GFR calc non Af Amer: >60 mL/min (ref >60)
Glucose, Bld: 313 mg/dL — ABNORMAL HIGH (ref 70–99)
Potassium: 3.7 mmol/L (ref 3.5–5.1)
Sodium: 138 mmol/L (ref 135–145)
Total Bilirubin: 0.7 mg/dL (ref 0.3–1.2)
Total Protein: 5.2 g/dL — ABNORMAL LOW (ref 6.5–8.1)

## 2019-12-20 LAB — POCT I-STAT 7, (LYTES, BLD GAS, ICA,H+H)
Acid-Base Excess: 15 mmol/L — ABNORMAL HIGH (ref 0.0–2.0)
Bicarbonate: 42.4 mmol/L — ABNORMAL HIGH (ref 20.0–28.0)
Calcium, Ion: 1.06 mmol/L — ABNORMAL LOW (ref 1.15–1.40)
HCT: 33 % — ABNORMAL LOW (ref 36.0–46.0)
Hemoglobin: 11.2 g/dL — ABNORMAL LOW (ref 12.0–15.0)
O2 Saturation: 100 %
Potassium: 2.8 mmol/L — ABNORMAL LOW (ref 3.5–5.1)
Sodium: 136 mmol/L (ref 135–145)
TCO2: 44 mmol/L — ABNORMAL HIGH (ref 22–32)
pCO2 arterial: 64.6 mmHg — ABNORMAL HIGH (ref 32.0–48.0)
pH, Arterial: 7.425 (ref 7.350–7.450)
pO2, Arterial: 272 mmHg — ABNORMAL HIGH (ref 83.0–108.0)

## 2019-12-20 LAB — URINALYSIS, ROUTINE W REFLEX MICROSCOPIC
Bilirubin Urine: NEGATIVE
Glucose, UA: 50 mg/dL — AB
Hgb urine dipstick: NEGATIVE
Ketones, ur: NEGATIVE mg/dL
Leukocytes,Ua: NEGATIVE
Nitrite: NEGATIVE
Protein, ur: 100 mg/dL — AB
Specific Gravity, Urine: 1.019 (ref 1.005–1.030)
pH: 5 (ref 5.0–8.0)

## 2019-12-20 LAB — TROPONIN I (HIGH SENSITIVITY)
Troponin I (High Sensitivity): 76 ng/L — ABNORMAL HIGH (ref <18)
Troponin I (High Sensitivity): 161 ng/L (ref <18)

## 2019-12-20 LAB — PROTIME-INR
INR: 1.3 — ABNORMAL HIGH (ref 0.8–1.2)
Prothrombin Time: 15.4 seconds — ABNORMAL HIGH (ref 11.4–15.2)

## 2019-12-20 LAB — D-DIMER, QUANTITATIVE: D-Dimer, Quant: 3.32 ug/mL-FEU — ABNORMAL HIGH (ref 0.00–0.50)

## 2019-12-20 LAB — RESPIRATORY PANEL BY RT PCR (FLU A&B, COVID)
Influenza A by PCR: NEGATIVE
Influenza B by PCR: NEGATIVE
SARS Coronavirus 2 by RT PCR: NEGATIVE

## 2019-12-20 LAB — CORTISOL: Cortisol, Plasma: 21.5 ug/dL

## 2019-12-20 LAB — PROCALCITONIN: Procalcitonin: <0.1 ng/mL

## 2019-12-20 LAB — VALPROIC ACID LEVEL: Valproic Acid Lvl: <10 ug/mL — ABNORMAL LOW (ref 50.0–100.0)

## 2019-12-20 IMAGING — DX DG CHEST 1V PORT
1 series · 1 of 1 positions shown · non-contrast
Comparison: [DATE]

CLINICAL DATA: Central line placement

EXAM:
PORTABLE CHEST 1 VIEW

[chest]
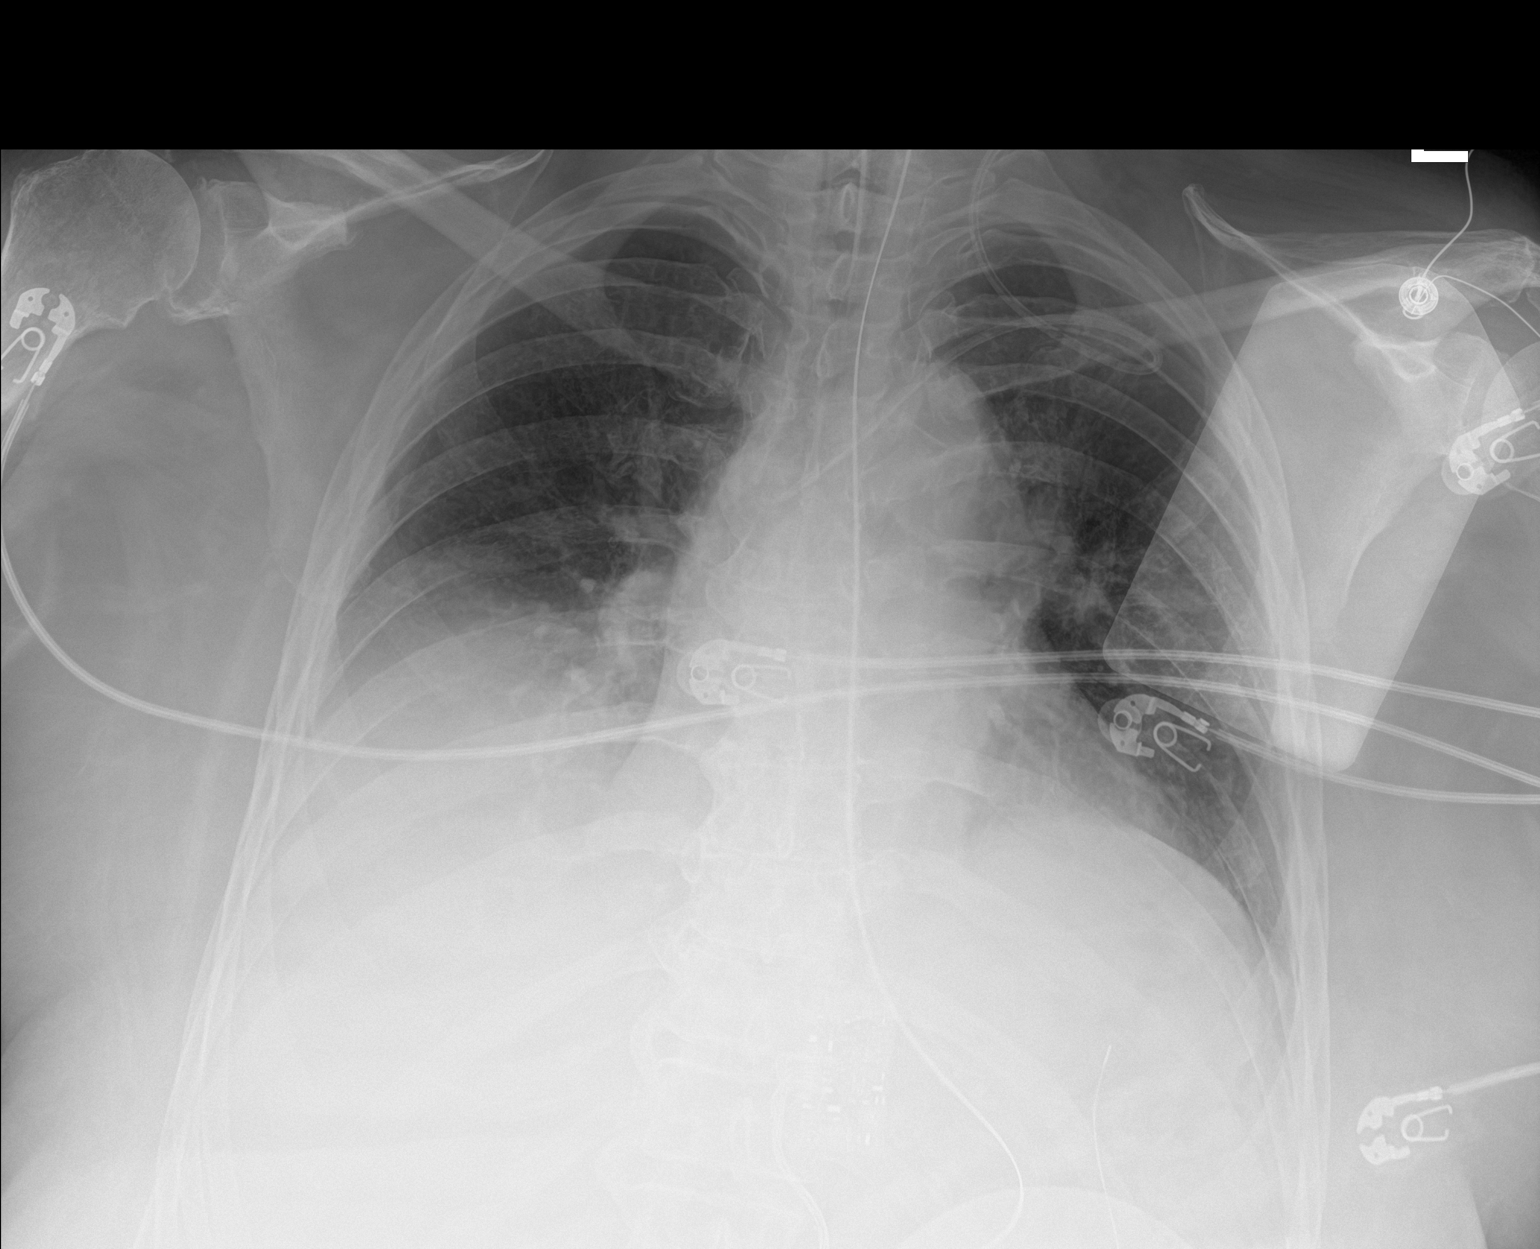

[1 of 1 positions shown; findings below may reference images not displayed]

FINDINGS: Single frontal view of the chest demonstrates endotracheal tube and
enteric catheter unchanged. There is a left internal jugular central
venous catheter, which courses retrograde and is coiled over the
region of the left subclavian vein before terminating over the
region of the superior vena cava. Please correlate with catheter
function.

Cardiac silhouette is stable. Increased veiling opacity at the right
lung base consistent with progressive consolidation and/or effusion.
There is no pneumothorax.
IMPRESSION: 1. Left internal jugular catheter coiled over the left subclavian
vein, with tip normally positioned over the superior vena cava.
Please correlate with catheter function.
2. Increasing right basilar consolidation and/or effusion.

These results will be called to the ordering clinician or
representative by the Radiologist Assistant, and communication
documented in the PACS or [REDACTED].

## 2019-12-20 IMAGING — CT CT HEAD W/O CM
4 series · 17 of 47 positions shown, 19 images · non-contrast
Comparison: Head CT dated [DATE].

CLINICAL DATA: 74-year-old female with unresponsiveness.

EXAM:
CT HEAD WITHOUT CONTRAST
TECHNIQUE: Contiguous axial images were obtained from the base of the skull
through the vertex without intravenous contrast.

[Series 3: head wo · axial · 0.50mm/px · z∈[-144,+2]mm · 7 of 39 slices shown, 9 images]
[im 5/39  brain]
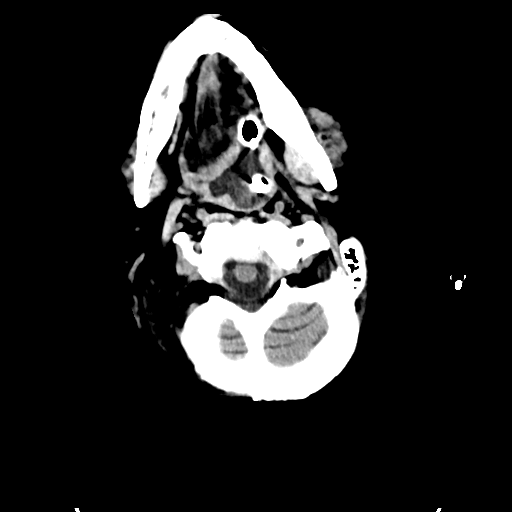
[im 5/39  bone]
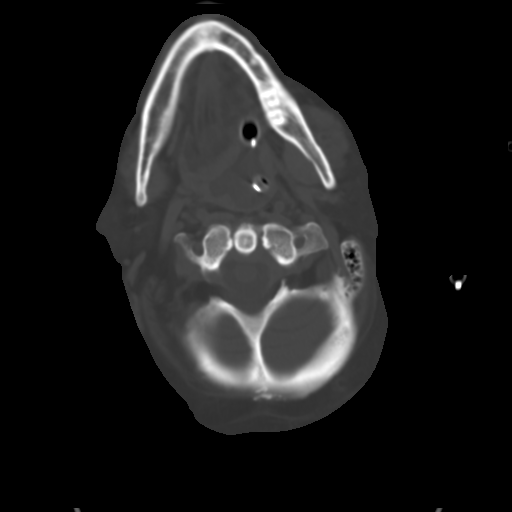
[im 10/39  brain]
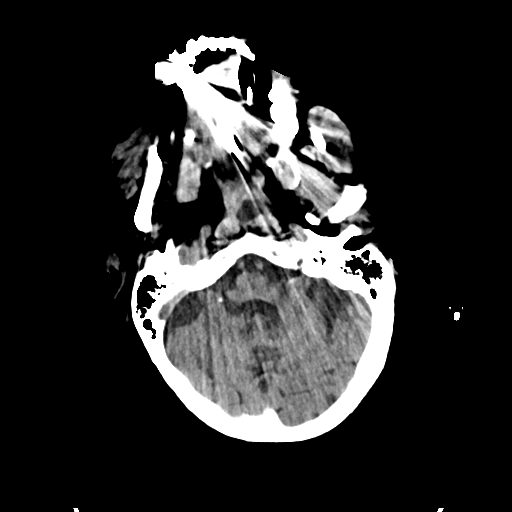
[im 15/39  brain]
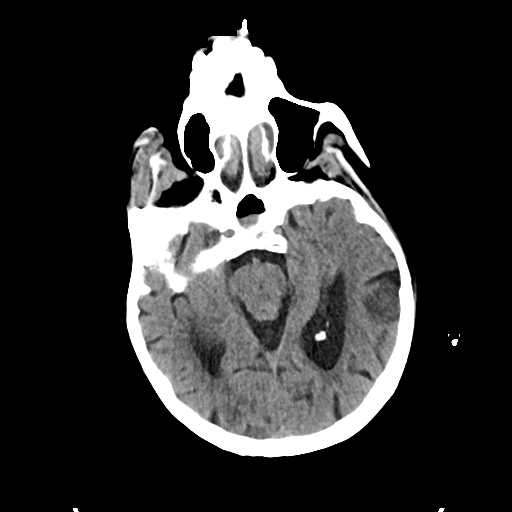
[im 20/39  brain]
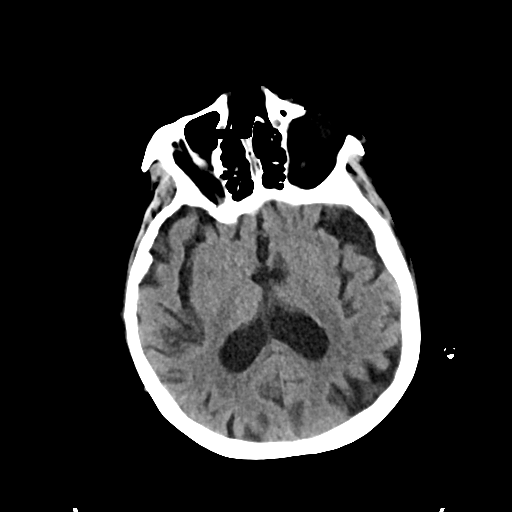
[im 24/39  brain]
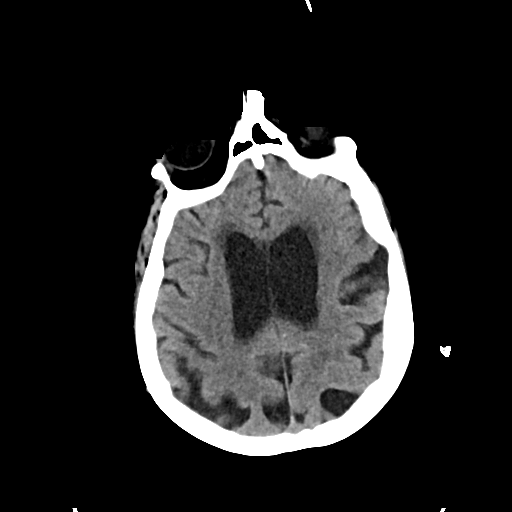
[im 24/39  bone]
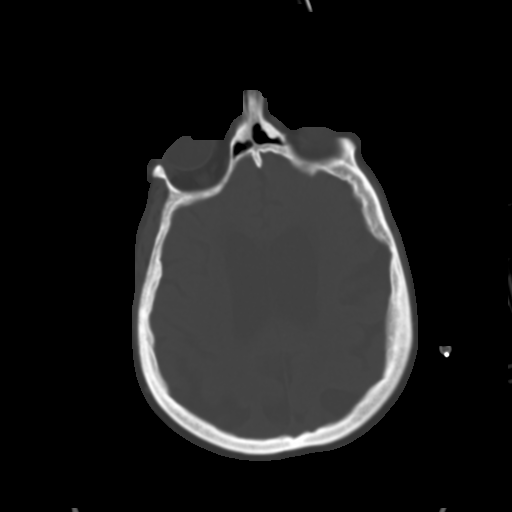
[im 29/39  brain]
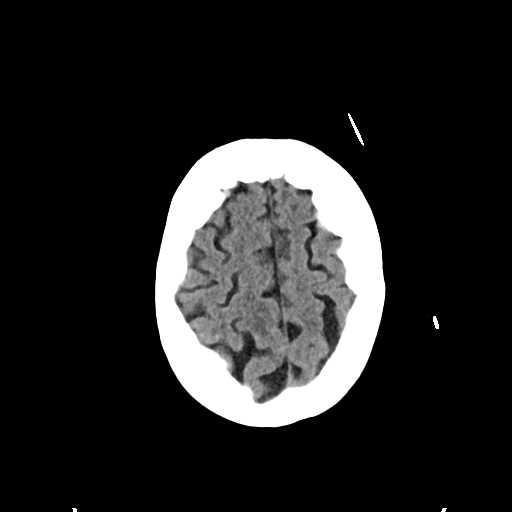
[im 34/39  brain]
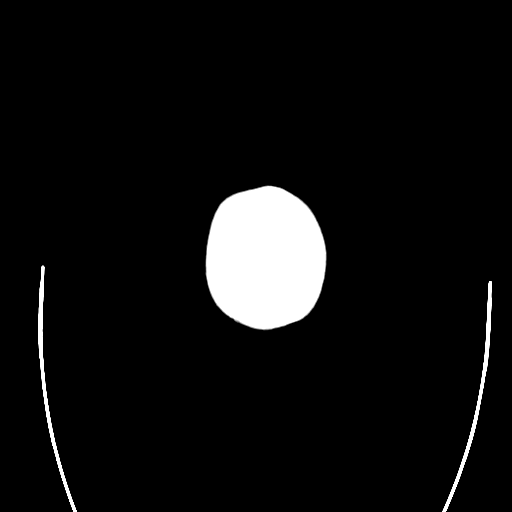

[Series 4: head bone · axial · 0.50mm/px · z∈[-146,-78]mm · 4 of 97 slices shown]
[im 10/97  bone]
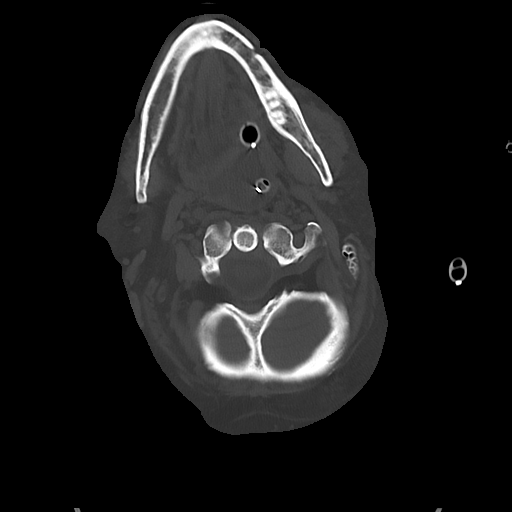
[im 20/97  bone]
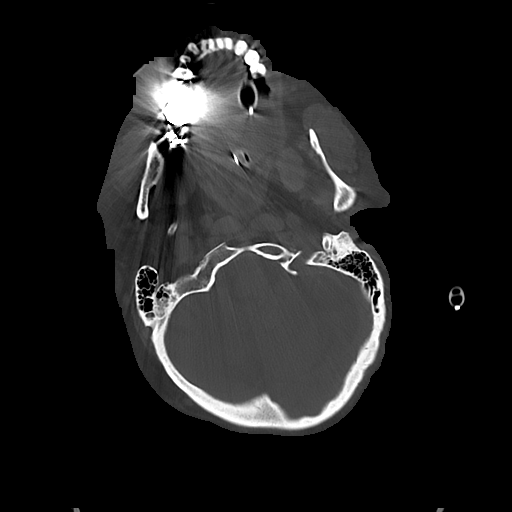
[im 29/97  bone]
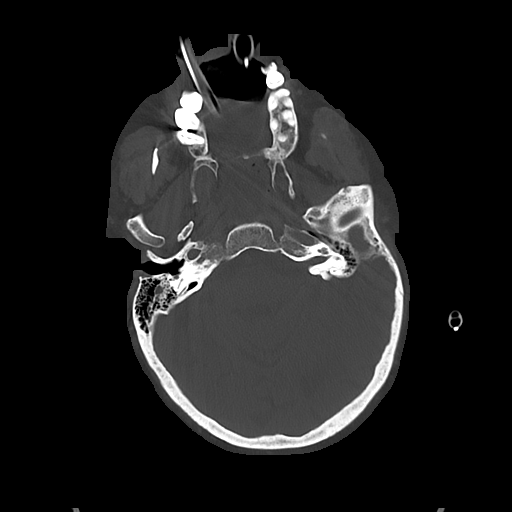
[im 44/97  bone]
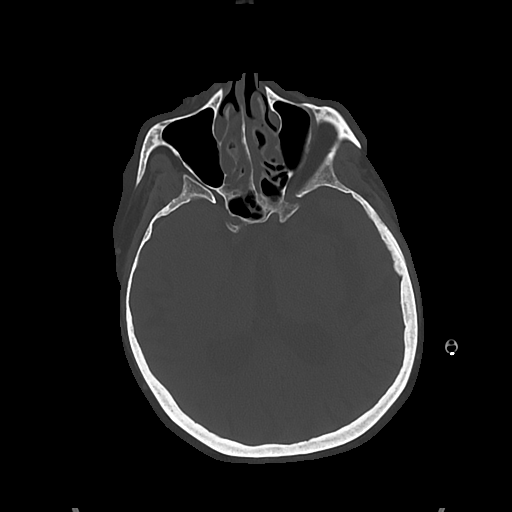

[Series 5: cor soft · coronal · 0.35mm/px · 3 of 73 slices shown]
[im 28/73  brain]
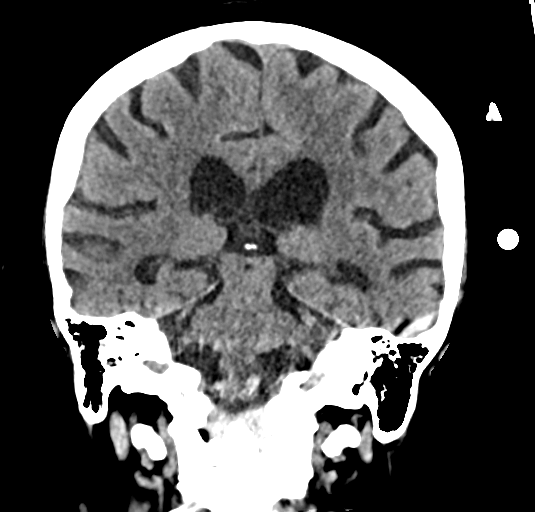
[im 34/73  brain]
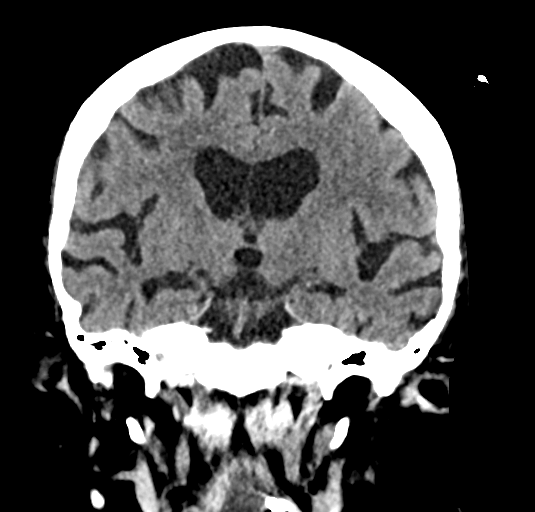
[im 39/73  brain]
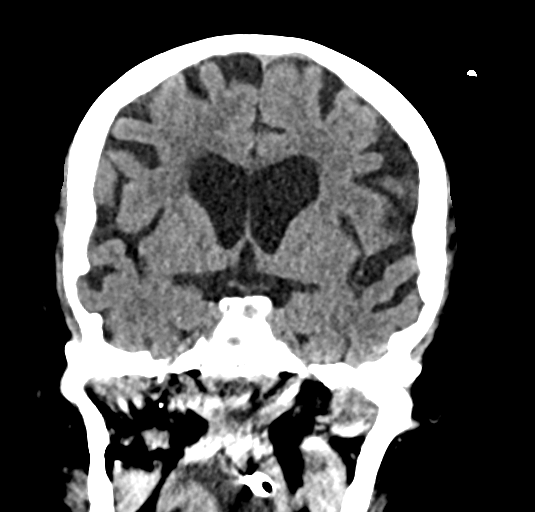

[Series 6: sag soft · sagittal · 0.37mm/px · 3 of 63 slices shown]
[im 21/63  brain]
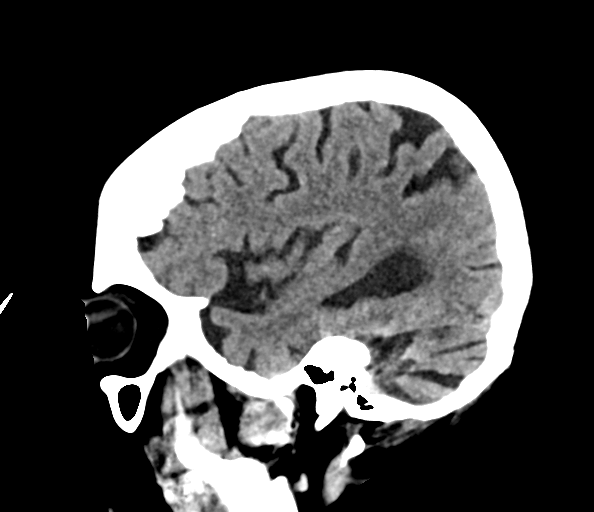
[im 32/63  brain]
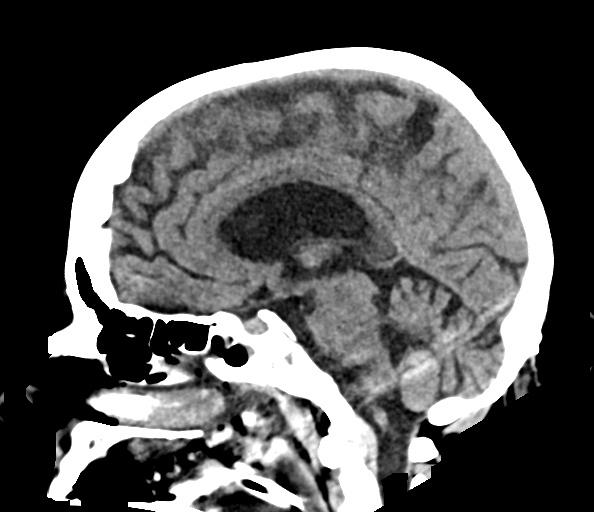
[im 42/63  brain]
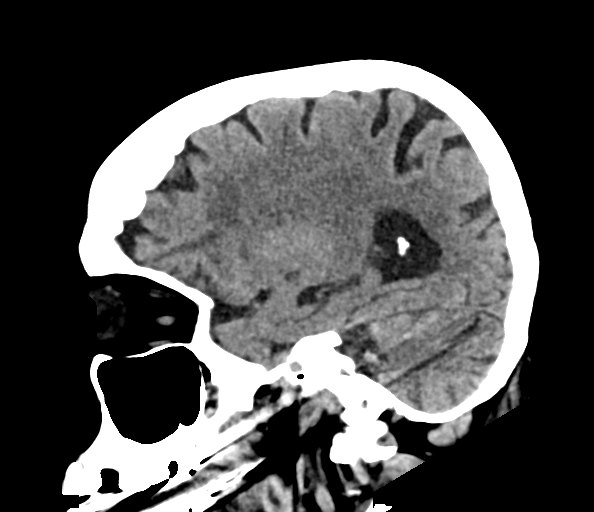

[17 of 47 positions shown; findings below may reference images not displayed]

FINDINGS: Brain: There is age-related atrophy and chronic microvascular
ischemic changes. There is no acute intracranial hemorrhage. No mass
effect or midline shift. No extra-axial fluid collection.

Vascular: No hyperdense vessel or unexpected calcification.

Skull: Normal. Negative for fracture or focal lesion.

Sinuses/Orbits: There is mucoperiosteal thickening of paranasal
sinuses with partial opacification of the ethmoid air cells and
sphenoid sinuses. The mastoid air cells are clear.

Other: An endotracheal and an enteric tube are partially visualized.
IMPRESSION: 1. No acute intracranial pathology.
2. Age-related atrophy and chronic microvascular ischemic changes.

## 2019-12-20 IMAGING — CT CT ABD-PELV W/ CM
3 of 5 series · 16 of 46 positions shown, 18 images · IV contrast (APPLIED)
Comparison: [DATE]

CLINICAL DATA: Sepsis. Decreased mental status.

EXAM:
CT ABDOMEN AND PELVIS WITH CONTRAST
TECHNIQUE: Multidetector CT imaging of the abdomen and pelvis was performed
using the standard protocol following bolus administration of
intravenous contrast.
CONTRAST:  125mL OMNIPAQUE IOHEXOL 300 MG/ML  SOLN

[Series 3: abdomen 5.0 · axial · 0.91mm/px · z∈[-536,-81]mm · 11 of 111 slices shown, 13 images]
[im 10/111  soft-tissue]
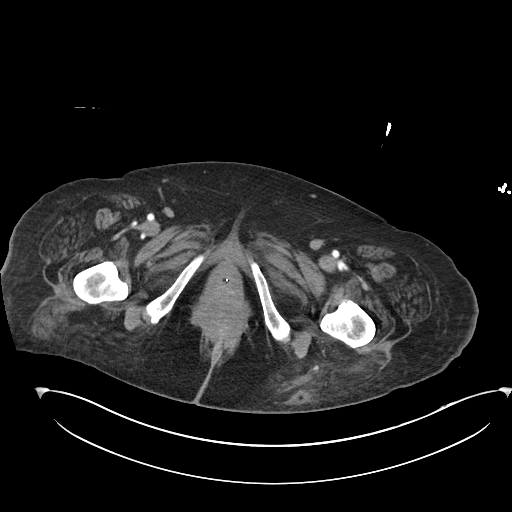
[im 10/111  bone]
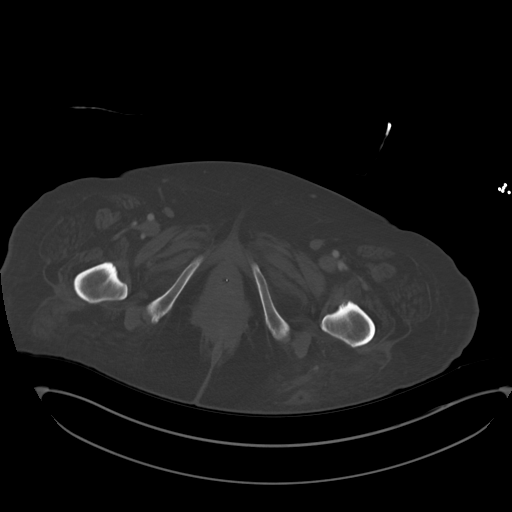
[im 19/111  soft-tissue]
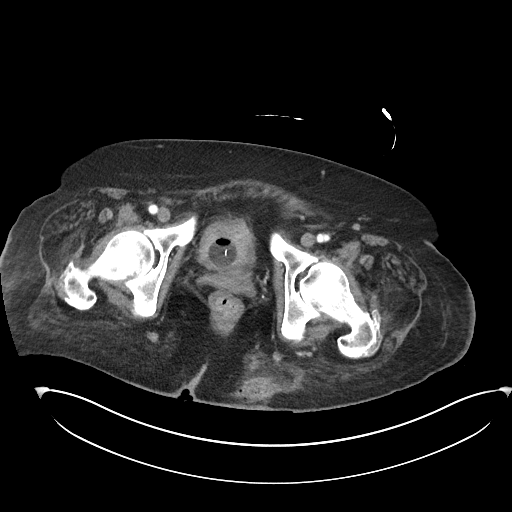
[im 28/111  soft-tissue]
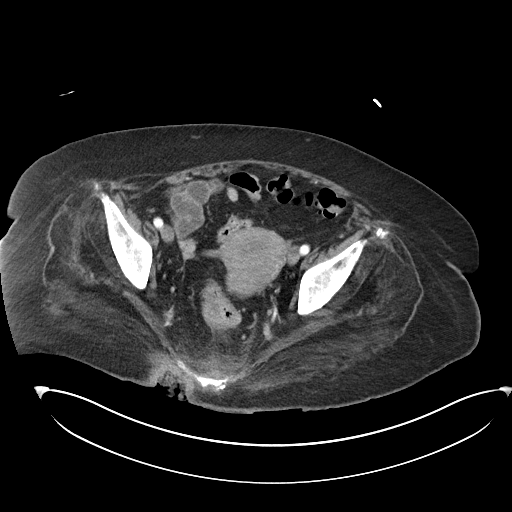
[im 37/111  soft-tissue]
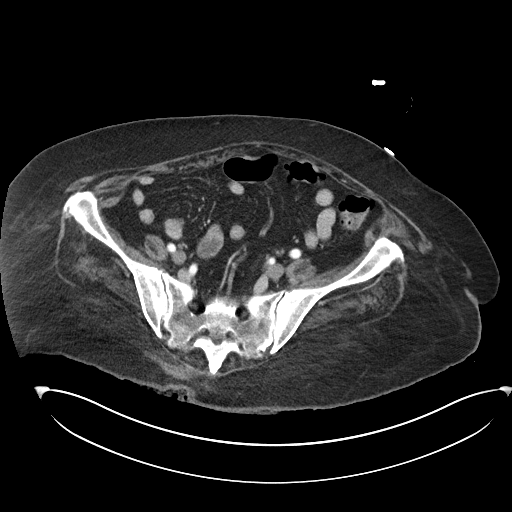
[im 46/111  soft-tissue]
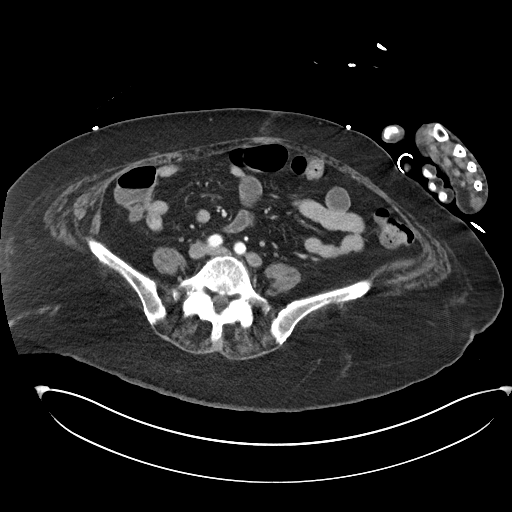
[im 56/111  soft-tissue]
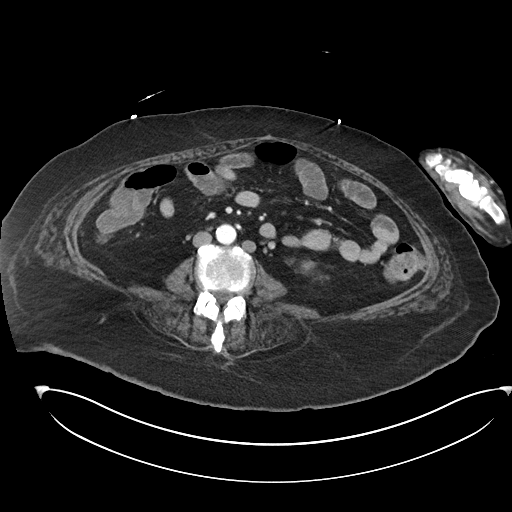
[im 65/111  soft-tissue]
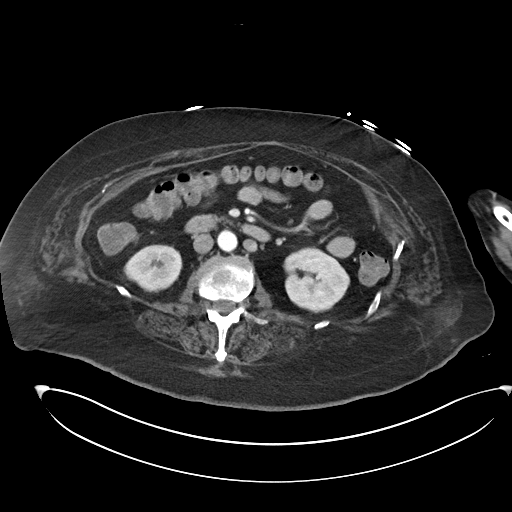
[im 74/111  soft-tissue]
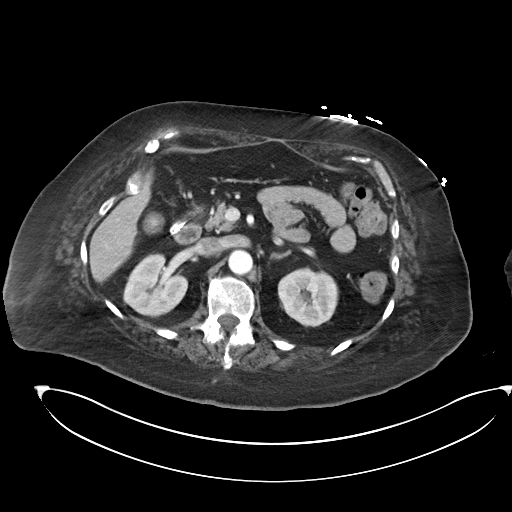
[im 83/111  soft-tissue]
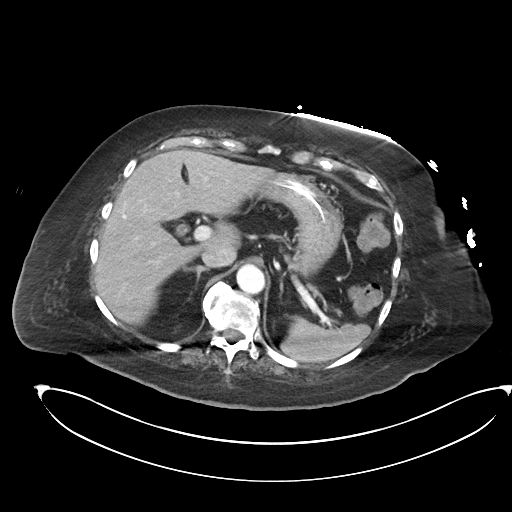
[im 83/111  bone]
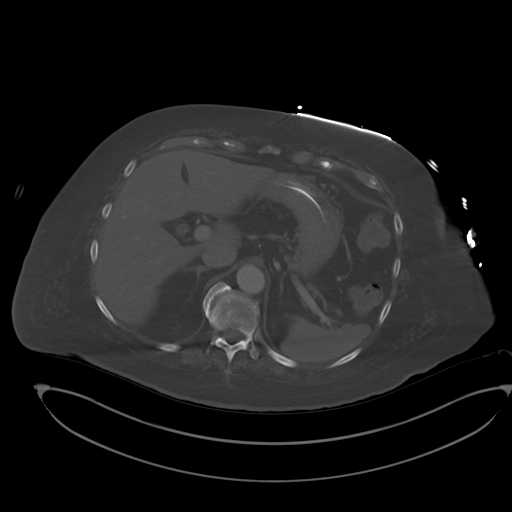
[im 92/111  soft-tissue]
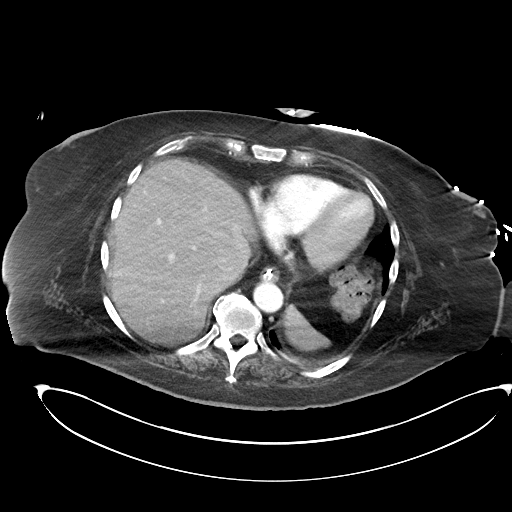
[im 101/111  soft-tissue]
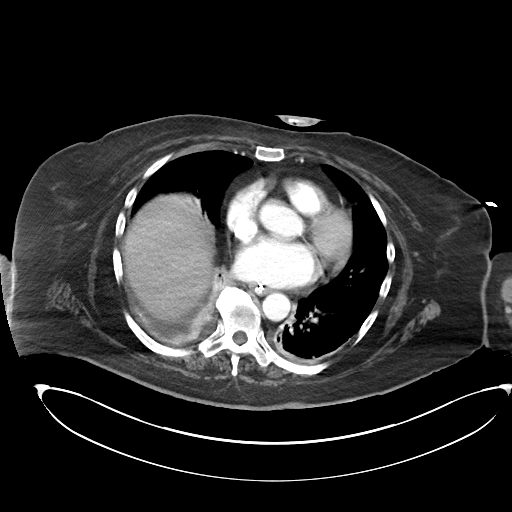

[Series 5: lung · axial · 0.91mm/px · z∈[-269,-233]mm · 2 of 129 slices shown]
[im 10/129  bone]
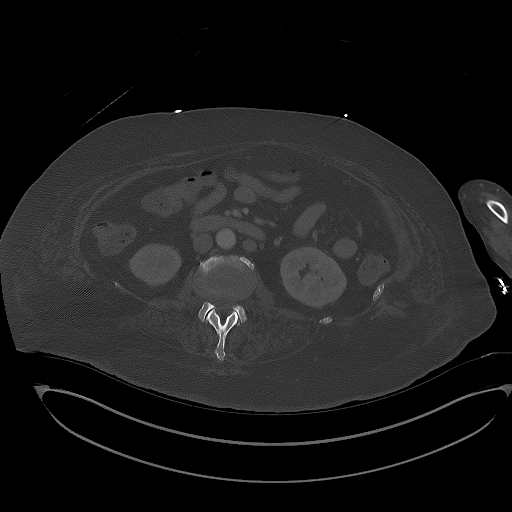
[im 28/129  bone]
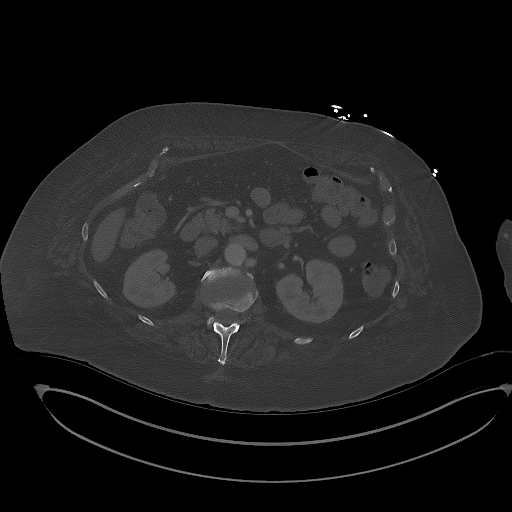

[Series 6: abdomen 3.0 mpr cor · coronal · 0.84mm/px · 3 of 93 slices shown]
[im 31/93  soft-tissue]
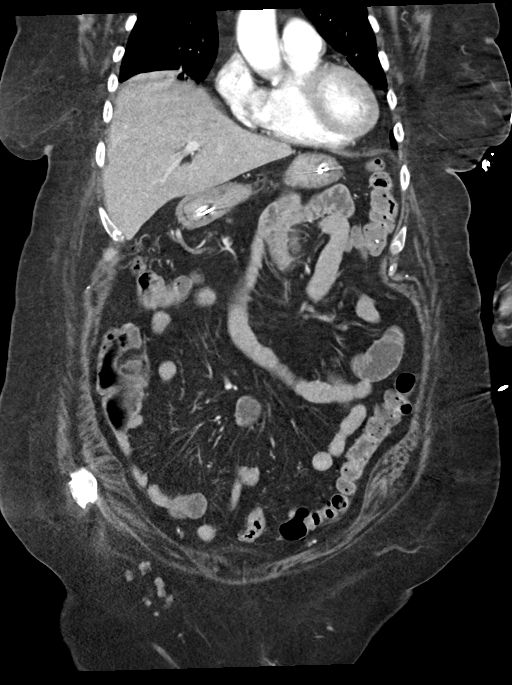
[im 41/93  soft-tissue]
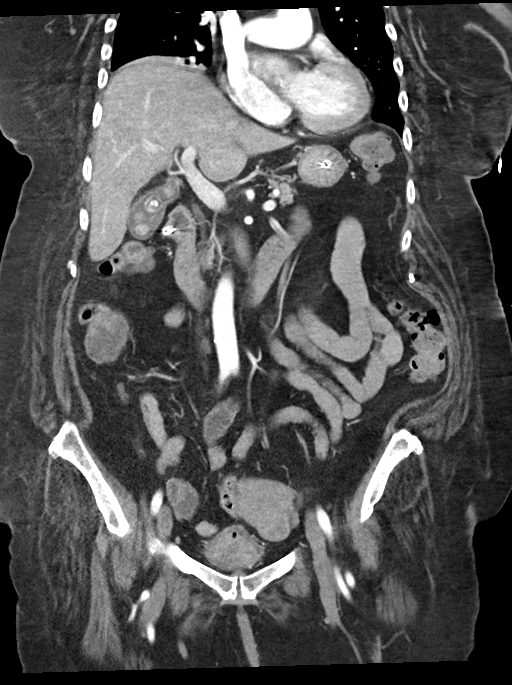
[im 52/93  soft-tissue]
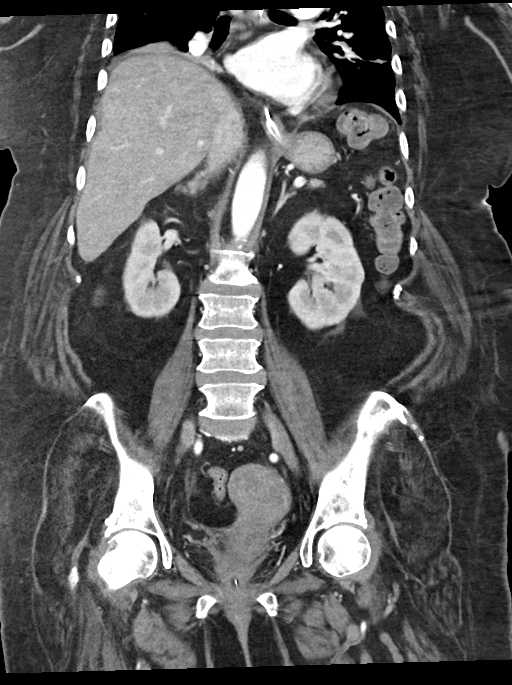

[16 of 46 positions shown; findings below may reference images not displayed]

FINDINGS: Lower Chest: Small right pleural effusion. Bibasilar atelectasis,
right side greater than left.

Hepatobiliary: No hepatic masses identified. Gallstones are seen,
however there is no evidence of cholecystitis or biliary dilatation.

Pancreas:  No mass or inflammatory changes.

Spleen: Within normal limits in size and appearance.

Adrenals/Urinary Tract: Small cyst again noted in the upper pole of
right kidney no masses identified. No evidence of ureteral calculi
or hydronephrosis. Foley catheter seen within the bladder, which is
empty.

Stomach/Bowel: Nasogastric tube is seen with tip in the duodenal
bulb. No evidence of obstruction, inflammatory process or abnormal
fluid collections.

Vascular/Lymphatic: No pathologically enlarged lymph nodes. No
abdominal aortic aneurysm. Congenital duplication of IVC
incidentally noted.

Reproductive: Several uterine fibroids are seen, largest posteriorly
measuring 4.3 cm. Adnexal regions are unremarkable.

Other: Previously seen fistula and abscess in the left buttock is
significantly decreased in size since previous study.

Musculoskeletal: A decubitus ulcer just to the right of midline
along the gluteal crease remains unchanged, with stable appearance
of osteomyelitis involving distal sacrum and coccyx.
IMPRESSION: 1. Significant decrease in size of left buttock fistula and abscess
since previous study.
2. Stable decubitus ulcer with osteomyelitis involving distal sacrum
and coccyx.
3. Cholelithiasis. No radiographic evidence of cholecystitis.
4. Stable small uterine fibroids.
5. Increased small right pleural effusion and bibasilar atelectasis.

## 2019-12-20 IMAGING — DX DG CHEST 1V PORT
1 series · 1 of 1 positions shown · non-contrast
Comparison: Chest radiograph dated [DATE].

CLINICAL DATA: Cardiac arrest

EXAM:
PORTABLE CHEST 1 VIEW

[chest]
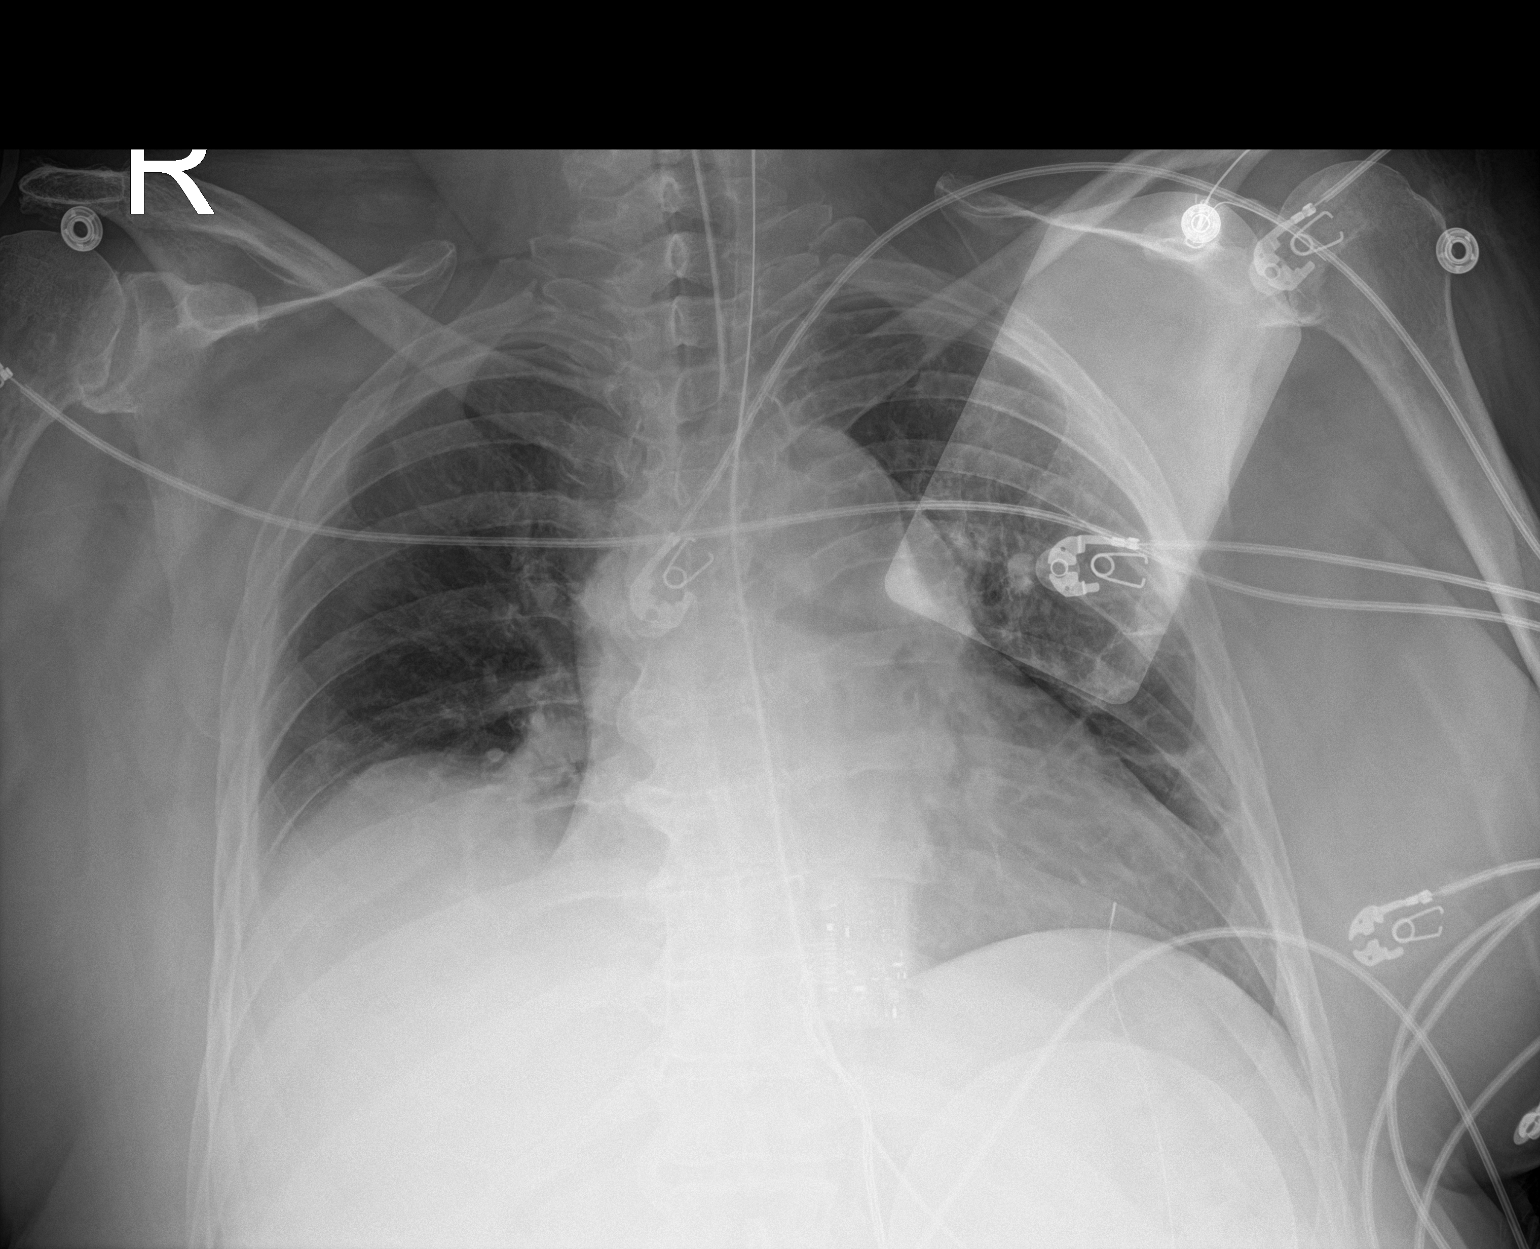

[1 of 1 positions shown; findings below may reference images not displayed]

FINDINGS: Defibrillator pads overlie the left chest. An endotracheal tube
terminates in the upper thoracic trachea. An enteric tube enters the
stomach and terminates below the field of view. The heart remains
enlarged. Mild bibasilar atelectasis/airspace disease is noted. A
small right pleural effusion may contribute. There is no left
pleural effusion. There is no pneumothorax.
IMPRESSION: Mild bibasilar atelectasis/airspace disease. A small right pleural
effusion may contribute.

## 2019-12-20 MED ORDER — VANCOMYCIN HCL 1250 MG/250ML IV SOLN
1250.0000 mg | INTRAVENOUS | Status: DC
  Administered 2019-12-21: 1250 mg via INTRAVENOUS
  Filled 2019-12-20: qty 250

## 2019-12-20 MED ORDER — PANTOPRAZOLE SODIUM 40 MG PO TBEC
40.0000 mg | DELAYED_RELEASE_TABLET | Freq: Every day | ORAL | Status: DC
  Administered 2019-12-21: 40 mg via ORAL
  Filled 2019-12-20: qty 1

## 2019-12-20 MED ORDER — MIDAZOLAM HCL 2 MG/2ML IJ SOLN
1.0000 mg | INTRAMUSCULAR | Status: DC | PRN
  Administered 2019-12-20: 1 mg via INTRAVENOUS
  Filled 2019-12-20: qty 2

## 2019-12-20 MED ORDER — NOREPINEPHRINE 4 MG/250ML-% IV SOLN
0.0000 ug/min | INTRAVENOUS | Status: DC
  Administered 2019-12-20: 5 ug/min via INTRAVENOUS
  Administered 2019-12-21: 2 ug/min via INTRAVENOUS
  Filled 2019-12-20: qty 250

## 2019-12-20 MED ORDER — IPRATROPIUM-ALBUTEROL 0.5-2.5 (3) MG/3ML IN SOLN
3.0000 mL | Freq: Four times a day (QID) | RESPIRATORY_TRACT | Status: DC
  Administered 2019-12-20 – 2019-12-22 (×7): 3 mL via RESPIRATORY_TRACT
  Filled 2019-12-20 (×7): qty 3

## 2019-12-20 MED ORDER — SODIUM CHLORIDE 0.9 % IV SOLN
1.0000 g | Freq: Three times a day (TID) | INTRAVENOUS | Status: DC
  Administered 2019-12-20 – 2019-12-22 (×6): 1 g via INTRAVENOUS
  Filled 2019-12-20 (×7): qty 1

## 2019-12-20 MED ORDER — IOHEXOL 300 MG/ML  SOLN
125.0000 mL | Freq: Once | INTRAMUSCULAR | Status: AC | PRN
  Administered 2019-12-20: 125 mL via INTRAVENOUS

## 2019-12-20 MED ORDER — FENTANYL CITRATE (PF) 100 MCG/2ML IJ SOLN
100.0000 ug | INTRAMUSCULAR | Status: DC | PRN
  Administered 2019-12-20 – 2019-12-22 (×3): 100 ug via INTRAVENOUS
  Filled 2019-12-20 (×2): qty 2

## 2019-12-20 MED ORDER — VALPROIC ACID 250 MG/5ML PO SOLN
500.0000 mg | Freq: Every morning | ORAL | Status: DC
  Administered 2019-12-21 – 2019-12-22 (×2): 500 mg
  Filled 2019-12-20 (×2): qty 10

## 2019-12-20 MED ORDER — MIDAZOLAM HCL 2 MG/2ML IJ SOLN: 1.0000 mg | INTRAMUSCULAR | Status: DC | PRN

## 2019-12-20 MED ORDER — LACTATED RINGERS IV SOLN: INTRAVENOUS | Status: DC

## 2019-12-20 MED ORDER — POTASSIUM CHLORIDE 20 MEQ/15ML (10%) PO SOLN
40.0000 meq | ORAL | Status: AC
  Administered 2019-12-20 – 2019-12-21 (×2): 40 meq
  Filled 2019-12-20 (×2): qty 30

## 2019-12-20 MED ORDER — DOCUSATE SODIUM 100 MG PO CAPS
100.0000 mg | ORAL_CAPSULE | Freq: Two times a day (BID) | ORAL | Status: DC | PRN
  Administered 2019-12-27: 100 mg via ORAL
  Filled 2019-12-20: qty 1

## 2019-12-20 MED ORDER — SODIUM CHLORIDE 0.9 % IV BOLUS
1000.0000 mL | Freq: Once | INTRAVENOUS | Status: AC
  Administered 2019-12-20: 1000 mL via INTRAVENOUS

## 2019-12-20 MED ORDER — POLYETHYLENE GLYCOL 3350 17 G PO PACK: 17.0000 g | PACK | Freq: Every day | ORAL | Status: DC | PRN

## 2019-12-20 MED ORDER — VALPROIC ACID 250 MG/5ML PO SOLN
500.0000 mg | Freq: Every day | ORAL | Status: DC
  Administered 2019-12-20 – 2019-12-21 (×2): 500 mg
  Filled 2019-12-20 (×2): qty 10

## 2019-12-20 MED ORDER — INSULIN ASPART 100 UNIT/ML ~~LOC~~ SOLN
0.0000 [IU] | SUBCUTANEOUS | Status: DC
  Administered 2019-12-20: 4 [IU] via SUBCUTANEOUS
  Administered 2019-12-21: 3 [IU] via SUBCUTANEOUS
  Administered 2019-12-21: 4 [IU] via SUBCUTANEOUS
  Administered 2019-12-21: 3 [IU] via SUBCUTANEOUS
  Administered 2019-12-22: 4 [IU] via SUBCUTANEOUS
  Administered 2019-12-22: 7 [IU] via SUBCUTANEOUS
  Administered 2019-12-22: 4 [IU] via SUBCUTANEOUS
  Administered 2019-12-22: 7 [IU] via SUBCUTANEOUS

## 2019-12-20 MED ORDER — FENTANYL CITRATE (PF) 100 MCG/2ML IJ SOLN
100.0000 ug | INTRAMUSCULAR | Status: DC | PRN
  Administered 2019-12-20 (×2): 100 ug via INTRAVENOUS
  Filled 2019-12-20 (×4): qty 2

## 2019-12-20 NOTE — Progress Notes (Signed)
CRITICAL VALUE ALERT  Critical Value:  K 2.6  Date & Time Notied:  12/20/19 2156  Provider Notified: Pola Corn  Orders Received/Actions taken: see new orders

## 2019-12-20 NOTE — ED Notes (Signed)
30MG  ETOMIDAT IV

## 2019-12-20 NOTE — ED Notes (Signed)
Lt neck central line placed

## 2019-12-20 NOTE — ED Notes (Signed)
PORTABLE CHEST XRAy

## 2019-12-20 NOTE — ED Triage Notes (Signed)
The pt arrived [name]  SHE WAS LAST SEEN AT [facility]  SHE WAS FOUND UNRESPONSIVE BY STAFF WITH AGONAL BLEATHING   NO CPR    EM S INTUBATED HER GAVE HER  FENTABYL 50 MCG AND VERSED 2.5 WHEN SHE WAS FIGHTING THE TUBE      THE PT WAS JUST DISCHARGED FROM A SEPSIS ADMISSION  BOTH PUPIUL APPROX SIZE 2.O  SLUGGISH  MIDLINE RT ARM  FROM NURSING HOME FOR ANTIBOTICS IV PLACED RT A-C ON ARRIVAL

## 2019-12-20 NOTE — ED Notes (Signed)
LEVO DRIP TAYLOR

## 2019-12-20 NOTE — ED Notes (Signed)
Critical care pa at the bedside

## 2019-12-20 NOTE — ED Notes (Signed)
Waiting for a chest xray to see if I can transfer meds to  Another line

## 2019-12-20 NOTE — Progress Notes (Signed)
Pt arrived to ICU from ED with stable VS, but with fluctuations in HR with an atrial arrhythmia. Notified [facility] and received order to obtain repeat labs. Noted that CVC was found to be coiled on CXR and unsure about placement, venous gas obtained from line with additional labs that were ordered. Asked [facility] for an a-line to better monitor BP that was starting to have inconsistent readings, ranging from 50/40 to 210/70. Critical results from venous gas called in to [facility], waiting for ground team to assess.

## 2019-12-20 NOTE — ED Notes (Signed)
LEVO  DOWN 10 MCG

## 2019-12-20 NOTE — ED Notes (Signed)
LEVO 20

## 2019-12-20 NOTE — ED Notes (Addendum)
LEVO 5MG  IV  LT MIDLINE

## 2019-12-20 NOTE — ED Notes (Signed)
Levo down to 8

## 2019-12-20 NOTE — ED Notes (Signed)
Pt moving round at present

## 2019-12-20 NOTE — Procedures (Signed)
Arterial Catheter Insertion Procedure Note Jazzmin Newbold [id number] 30-Jan-1946  Procedure: Insertion of Arterial Catheter  Indications: Blood pressure monitoring and Frequent blood sampling  Procedure Details Consent: Unable to obtain consent because of altered level of consciousness. Time Out: Verified patient identification, verified procedure, site/side was marked, verified correct patient position, special equipment/implants available, medications/allergies/relevent history reviewed, required imaging and test results available.  Performed  Maximum sterile technique was used including antiseptics, cap, gloves, gown, hand hygiene, mask and sheet. Skin prep: Chlorhexidine; local anesthetic administered 20 gauge catheter was inserted into right radial artery using the Seldinger technique. ULTRASOUND GUIDANCE USED: NO Evaluation Blood flow good; BP tracing good. Complications: No apparent complications.   Inez Pilgrim 12/20/2019

## 2019-12-20 NOTE — ED Notes (Signed)
Critical care pa at  The bedside placing a central line

## 2019-12-20 NOTE — ED Notes (Addendum)
INTUBATED 7.5 BY DR Anitra Lauth

## 2019-12-20 NOTE — H&P (Signed)
NAME:  Becky Hendricks, MRN:  [id number], DOB:  1946/01/23, LOS: 0 ADMISSION DATE:  12/20/2019, CONSULTATION DATE:  5/9 REFERRING MD:  Maryan Rued , CHIEF COMPLAINT:  Acute encephalopathy, brief cardiopulmonary arrest    Brief History   74 year old female admitted 5/9 after being found with agonal respiratory efforts and pulseless requiring less than 2 minutes of CPR at her skilled nursing facility where she was being treated for osteomyelitis of the coccyx Hypotensive and in shock on arrival critical care asked to admit  History of present illness   74 year old female with complicated medical history as mentioned below just discharged from the hospital on 5/6 where she was being treated for osteomyelitis for right buttock abscess with  isolated organisms of staph warneri and strep anginosis w/ blood culture + strep viridans.  She was sent to the skilled nursing facility on IV vancomycin and Unasyn with end date plan to be 01/21/2020.  She was apparently seen at the skilled nursing facility around lunchtime, noted to be "not quite herself" but there is no notable abnormality other than that.  Approximately 2 hours later she was in bed, on nursing rounds found to be unresponsive with agonal respiratory efforts, and no palpable pulse.  She underwent approximately 1 to 2 minutes CPR before spontaneous respiratory efforts were achieved.  She was intubated by EMS, transferred to the emergency room.  On arrival her blood glucose was noted to be 300, she was hypotensive with systolic blood pressure in the 60s, requiring epinephrine infusion to sustain blood pressure.   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 Recently discharged 5/6 on Unasyn and vancomycin  Significant Hospital Events   5/9 admitted w/ shock s/p brief cardiopulm arrest   Consults:  PCCM  Procedures:  oett 5/9  Significant Diagnostic Tests:  CT abd/pelvis 5/9>>> CT head 5/9>>>  Micro Data:  Blood culture 5/9>>>  Antimicrobials:  Vanc(w/plan to treat thru 6/10) Meropenem 5/9>>>  Interim history/subjective:  Opens eyes localizes w/ left hand   Objective   Blood pressure 96/73, pulse (Abnormal) 40, temperature 99.5 F (37.5 C), resp. rate 14, height '5\' 4"'  (1.626 m), SpO2 100 %.    Vent Mode: PRVC FiO2 (%):  [100 %] 100 % Set Rate:  [18 bmp] 18 bmp Vt Set:  [440 mL] 440 mL PEEP:  [5 cmH20] 5 cmH20 Plateau Pressure:  [19 cmH20] 19 cmH20  No intake or output data in the 24 hours ending 12/20/19 1712 There were no vitals filed for this visit.  Examination: General: Obese white female currently minimally responsive on full ventilatory support HENT: Pupils equal reactive mucous membranes moist currently orally intubated Lungs: Clear bilaterally no accessory use Cardiovascular: Regular rate and rhythm Abdomen: Soft not tender Extremities: Cool but palpable pulses no edema brisk cap refill Neuro: Opens eyes to noxious stimulus.  Moving left arm spontaneously more readily than right, seemingly purposeful. GU: Clear yellow Derm: Large bilateral stage IV pressure ulcers on both buttocks tunneling to the bone approximately golf ball in size bilaterally with  purulent drainage  Resolved Hospital Problem list     Assessment & Plan:  Brief cardiopulmonary arrest -Etiology unclear, possibly sepsis.  Doubt pulmonary emboli, was on Eliquis.  Seems unlikely primary cardiac event but possible Plan Admit to intensive care Telemetry  monitoring Echocardiogram, she would not be a candidate for cardiac intervention  Acute on chronic hypoxic respiratory failure.  Has chronic hypercarbic respiratory failure Portable chest x-ray personally reviewed the endotracheal tubes in satisfactory position there is what appears to be a chronically elevated right hemidiaphragm and bibasilar atelectasis  Plan Full ventilator support PAD protocol with RASS goal 0 VAP bundle  Severe sepsis, septic shock w/ lactic acidosis Recent diagnosis of strep viridans bacteremia and also polymicrobial pressure ulcer wound infection. Most likely source being osteomyelitis involving her coccyx.   Certainly symptomatic hypotension could have precipitated her brief cardiac arrest as mentioned above Plan Repeat cultures CT abdomen/pelvis, looking for new drainable abscess or indication for I&D Antibiotics, changing her to meropenem with vancomycin IV fluid bolus, complete 30 mL/kg Titrate norepinephrine for mean arterial pressure greater than 65 Check cortisol Serial lactic acid  History of atrial fibrillation Was on DOAC Currently rate controlled Plan Hold Eliquis and placed on IV heparin for now Rate control as indicated Holding antihypertensives  Fluid and electrolyte balance: Hypokalemia  plan  replace and recheck  Acute metabolic encephalopathy.  H/o bipolar disease  Has some baseline confusion versus dementia Not clear from the documentation what her true functional status is at baseline, seems as though it is been sometime since she has been there Plan Supportive care PAD protocol RASS goal 0 to -1 Considers seizure and postictal state, will get EEG, although this seems less likely the source Cont depakote; f/u [facility]   Diabetes with hyperglycemia Plan Sliding scale insulin  Anemia of chronic disease without evidence of bleeding Plan Serial CBC   Best practice:  Diet: NPO Pain/Anxiety/Delirium protocol (if indicated):  5/9 VAP protocol (if indicated): 5/9 DVT prophylaxis: iv heparin  GI prophylaxis: PPI Glucose control: ssi Mobility: BR Code Status: full code  Family Communication: pending  Disposition: sedated   Labs   CBC: Recent Labs  Lab 12/15/19 0630 12/16/19 0449 12/20/19 1543 12/20/19 1606 12/20/19 1628  WBC 12.0* 10.8* 11.3*  --   --   NEUTROABS  --   --  8.4*  --   --   HGB 9.2* 8.6* 10.2* 11.6* 11.2*  HCT 33.8* 29.5* 36.0 34.0* 33.0*  MCV 102.4* 97.0 98.1  --   --   PLT 350 266 244  --   --     Basic Metabolic Panel: Recent Labs  Lab 12/15/19 0630 12/16/19 0449 12/20/19 1543 12/20/19 1606 12/20/19 1628  NA 144 140 138 134* 136  K 4.5 3.7 3.7 3.6 2.8*  CL 88* 85* 84*  --   --   CO2 >50* 45* 38*  --   --   GLUCOSE 171* 116* 313*  --   --   BUN 6* 5* 22  --   --   CREATININE 0.32* 0.48 0.74  --   --   CALCIUM 8.8* 8.4* 7.9*  --   --   MG 1.9  --   --   --   --    GFR: Estimated Creatinine Clearance: 77.1 mL/min (by C-G formula based on SCr of 0.74 mg/dL). Recent Labs  Lab 12/15/19 0630 12/16/19 0449 12/20/19 1543  WBC 12.0* 10.8* 11.3*  LATICACIDVEN  --   --  6.0*    Liver Function Tests: Recent Labs  Lab 12/15/19 0630 12/20/19 1543  AST 22 34  ALT 16 19  ALKPHOS 98 77  BILITOT 0.8 0.7  PROT 5.7* 5.2*  ALBUMIN 1.8* 1.9*   No results for input(s): LIPASE, AMYLASE in the last 168 hours. No results for input(s): AMMONIA in the last 168 hours.  ABG    Component Value Date/Time   PHART 7.425 12/20/2019 1628   PCO2ART 64.6 (H) 12/20/2019 1628   PO2ART 272 (H) 12/20/2019 1628   HCO3 42.4 (H) 12/20/2019 1628   TCO2 44 (H) 12/20/2019 1628   O2SAT 100.0 12/20/2019 1628     Coagulation Profile: Recent Labs  Lab 12/20/19 1543  INR 1.3*    Cardiac Enzymes: No results for input(s): CKTOTAL, CKMB, CKMBINDEX, TROPONINI in the last 168 hours.  HbA1C: Hgb A1c MFr Bld  Date/Time Value Ref Range Status  12/03/2019 11:20 PM 7.8 (H) 4.8 - 5.6 % Final     Comment:    (NOTE) Pre diabetes:          5.7%-6.4% Diabetes:              >6.4% Glycemic control for   <7.0% adults with diabetes   09/07/2019 07:50 PM 9.5 (H) 4.8 - 5.6 % Final    Comment:    (NOTE) Pre diabetes:          5.7%-6.4% Diabetes:              >6.4% Glycemic control for   <7.0% adults with diabetes     CBG: Recent Labs  Lab 12/16/19 1202 12/16/19 1624 12/17/19 0802 12/17/19 1238 12/17/19 1649  GLUCAP 149* 187* 170* 241* 217*    Review of Systems:   Not able   Past Medical History  She,  has a past medical history of Atrial fibrillation ([place]), Bipolar disorder ([place]), Clostridium difficile diarrhea (08/02/2019), Dehydration, Essential hypertension, Morbid obesity ([place]), Type 2 diabetes mellitus ([place]), and Weakness.   Surgical History    Past Surgical History:  Procedure Laterality Date  . INCISION AND DRAINAGE PERIRECTAL ABSCESS Left 12/04/2019   Procedure: IRRIGATION AND DEBRIDEMENT LEFT BUTTOCK ABSCESS;  Surgeon: Georganna Skeans, MD;  Location: [facility];  Service: General;  Laterality: Left;  . INCISION AND DRAINAGE PERIRECTAL ABSCESS Left 12/10/2019   Procedure: REPEAT IRRIGATION AND DEBRIDEMENT BUTTOCK  ABSCESS;  Surgeon: Coralie Keens, MD;  Location: [facility];  Service: General;  Laterality: Left;     Social History   reports that she has never smoked. She has never used smokeless tobacco. She reports previous alcohol use. She reports previous drug use.   Family History   Her Family history is unknown by patient.   Allergies Allergies  Allergen Reactions  . Chlorhexidine Gluconate Itching     Home Medications  Prior to Admission medications   Medication Sig Start Date End Date Taking? Authorizing Provider  acetaminophen (TYLENOL) 325 MG tablet Take 650 mg by mouth every 6 (six) hours as needed for mild pain.    Yes [provider]  ampicillin-sulbactam (UNASYN) IVPB Inject 3 g into the vein every 6 (six) hours. Indication:   Coccygeal osteo/sacral decubitus ulcer  First Dose: Yes Last Day of Therapy:  01/21/20 Labs - Once weekly:  CBC/D and BMP, Labs - Every other week:  ESR and CRP Method of administration: Mini-Bag Plus / Gravity Method of administration may be changed at the discretion of home infusion pharmacist based upon assessment of the patient and/or  caregiver's ability to self-administer the medication ordered. 12/17/19 01/27/20 Yes Dessa Phi, DO  apixaban (ELIQUIS) 5 MG TABS tablet Take 5 mg by mouth 2 (two) times daily.  08/11/19 08/05/20 Yes [provider]  ascorbic acid (VITAMIN C) 1000 MG tablet Take 500 mg by mouth 2 (two) times daily. 09/08/12  Yes [provider]  atorvastatin (LIPITOR) 10 MG tablet Take 10 mg by mouth at bedtime.  11/21/19  Yes [provider]  calcium carbonate (TUMS - DOSED IN MG ELEMENTAL CALCIUM) 500 MG chewable tablet Chew 1 tablet by mouth daily.   Yes [provider]  carvedilol (COREG) 25 MG tablet Take 1 tablet (25 mg total) by mouth 2 (two) times daily with a meal. 12/17/19  Yes Dessa Phi, DO  cholecalciferol (VITAMIN D3) 25 MCG (1000 UNIT) tablet Take 1,000 Units by mouth daily.   Yes [provider]  divalproex (DEPAKOTE SPRINKLE) 125 MG capsule Take 125 mg by mouth at bedtime. 10/30/19  Yes [provider]  divalproex (DEPAKOTE) 500 MG DR tablet Take 500 mg by mouth daily. 06/13/12  Yes [provider]  insulin lispro (HUMALOG) 100 UNIT/ML cartridge Inject into the skin See admin instructions. Inject into the skin 2 times a day between meals, per sliding scale: BGL 0-200 = give nothing; 201-250 = 2 units; 251-300 = 4 units; 301-350 = 6 units; 351-400 = 8 units; 401-450 = 10 units; 451-500 = 12 units; >450 = 12 units and CALL PROVIDER IF STILL ELEVATED AFTER 2 HOURS   Yes [provider]  Ipratropium-Albuterol (COMBIVENT) 20-100 MCG/ACT AERS respimat Inhale 1 puff into the lungs every 6 (six)  hours. Patient taking differently: Inhale 1 puff into the lungs every 6 (six) hours as needed ("for COPD").  09/21/19  Yes Amin, Ankit Chirag, MD  JANUMET 50-1000 MG tablet Take 1 tablet by mouth 2 (two) times daily. 10/30/19  Yes [provider]  LANTUS 100 UNIT/ML injection Inject 13 Units into the skin daily. 11/23/19  Yes [provider]  lisinopril (ZESTRIL) 20 MG tablet Take 20 mg by mouth daily. 08/11/19 08/05/20 Yes [provider]  Omega-3 Fatty Acids (OMEGA-3 FISH OIL PO) Take 1 capsule by mouth daily.   Yes [provider]  sennosides-docusate sodium (SENOKOT-S) 8.6-50 MG tablet Take 1 tablet by mouth daily.   Yes [provider]  vancomycin IVPB Inject 1,000 mg into the vein every 12 (twelve) hours. Indication:  Coccygeal osteo/sacral decubitus ulcer  First Dose: Yes Last Day of Therapy:  01/21/2020 Labs - Sunday/Monday:  CBC/D, BMP, and vancomycin trough. Labs - Thursday:  BMP and vancomycin trough Labs - Every other week:  ESR and CRP Method of administration:Elastomeric Method of administration may be changed at the discretion of the patient and/or caregiver's ability to self-administer the medication ordered. 12/17/19 01/25/20 Yes Dessa Phi, DO     Critical care time: 5 min       Erick Colace ACNP-BC [place] Pager # [phone] OR # [phone] if no answer

## 2019-12-20 NOTE — ED Notes (Signed)
Levo 1m

## 2019-12-20 NOTE — ED Provider Notes (Signed)
[facility] EMERGENCY DEPARTMENT Provider Note   CSN: [id number] Arrival date & time: 12/20/19  1533     History Chief Complaint  Patient presents with  . post cpr    Becky Hendricks is a 74 y.o. female.  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  Staff reported that at lunch she did not look herself but when they asked she said she was fine.  Then later they found her in her room agonally breathing and no palpable pulse.  Paramedics were called.  They reported she received approximately 1 to 2 minutes of CPR and she started agonally breathing again.  When EMS arrived she did have a palpable pulse but was not maintaining her airway.  They bagged her until her sats improved and then intubated her with a 7.0 tube.  In transport patient sugar was 300, oxygen saturation was 100% however she was hypotensive in the 60s and was started on an epi drip.  Upon arrival to the emergency room patient's oxygen saturation was 100% blood pressure was 120/90 on an epi drip with a normal heart rate of less than 100.  No further history was available at that time.  The history is provided by the nursing home, the EMS personnel and medical records. The history is limited by the condition of the patient.       Past Medical History:  Diagnosis Date  . Atrial fibrillation ([place])   . Bipolar disorder ([place])   . Clostridium difficile diarrhea 08/02/2019  . Dehydration   . Essential hypertension   . Morbid obesity ([place])   . Type 2 diabetes mellitus ([place])   . Weakness     Patient Active Problem List   Diagnosis Date Noted  . Sacral wound   . Hypoxia   .  Severe sepsis ([place]) 12/03/2019  . Osteomyelitis ([place]) 12/03/2019  . Abscess of multiple sites of buttock 12/03/2019  . AF (paroxysmal atrial fibrillation) ([place]) 12/03/2019  . Respiratory failure with hypoxia ([place]) 09/15/2019  . Pneumonia due to COVID-19 virus 09/15/2019  . Acute metabolic encephalopathy 58/04/9832  . UTI (urinary tract infection), bacterial 09/15/2019  . Pressure injury of skin 09/10/2019  . AKI (acute kidney injury) ([place])   . Acute respiratory distress syndrome (ARDS) due to COVID-19 virus ([place]) 09/07/2019    Past Surgical History:  Procedure Laterality Date  . INCISION AND DRAINAGE PERIRECTAL ABSCESS Left 12/04/2019   Procedure: IRRIGATION AND DEBRIDEMENT LEFT BUTTOCK ABSCESS;  Surgeon: Georganna Skeans, MD;  Location: [facility];  Service: General;  Laterality: Left;  . INCISION AND DRAINAGE PERIRECTAL ABSCESS Left 12/10/2019   Procedure: REPEAT IRRIGATION AND DEBRIDEMENT BUTTOCK  ABSCESS;  Surgeon: Coralie Keens, MD;  Location: [facility];  Service: General;  Laterality: Left;     OB History   No obstetric history on file.     Family History  Family history unknown: Yes    Social History   Tobacco Use  . Smoking status: Never Smoker  . Smokeless tobacco: Never Used  Substance Use Topics  . Alcohol use: Not Currently  . Drug use: Not Currently    Home Medications Prior to Admission medications   Medication Sig  Start Date End Date Taking? Authorizing Provider  acetaminophen (TYLENOL) 325 MG tablet Take 325 mg by mouth every 6 (six) hours as needed for mild pain.    [provider]  ampicillin-sulbactam (UNASYN) IVPB Inject 3 g into the vein every 6 (six) hours. Indication:  Coccygeal osteo/sacral decubitus ulcer  First Dose: Yes Last Day of Therapy:  01/21/20 Labs - Once weekly:  CBC/D and BMP, Labs - Every other week:  ESR and CRP Method of administration: Mini-Bag Plus / Gravity Method of administration may be changed at the discretion of home  infusion pharmacist based upon assessment of the patient and/or caregiver's ability to self-administer the medication ordered. 12/17/19 01/27/20  Dessa Phi, DO  apixaban (ELIQUIS) 5 MG TABS tablet Take 5 mg by mouth 2 (two) times daily.  08/11/19 08/05/20  [provider]  ascorbic acid (VITAMIN C) 1000 MG tablet Take 500 mg by mouth 2 (two) times daily. 09/08/12   [provider]  atorvastatin (LIPITOR) 10 MG tablet Take 10 mg by mouth daily. 11/21/19   [provider]  calcium carbonate (TUMS - DOSED IN MG ELEMENTAL CALCIUM) 500 MG chewable tablet Chew 1 tablet by mouth daily.    [provider]  carvedilol (COREG) 25 MG tablet Take 1 tablet (25 mg total) by mouth 2 (two) times daily with a meal. 12/17/19   Dessa Phi, DO  cholecalciferol (VITAMIN D3) 25 MCG (1000 UNIT) tablet Take 1,000 Units by mouth daily.    [provider]  divalproex (DEPAKOTE SPRINKLE) 125 MG capsule Take 125 mg by mouth at bedtime. 10/30/19   [provider]  divalproex (DEPAKOTE) 500 MG DR tablet Take 500 mg by mouth daily. 06/13/12   [provider]  insulin lispro (HUMALOG) 100 UNIT/ML cartridge Inject into the skin 2 (two) times daily. Units not provided    [provider]  Ipratropium-Albuterol (COMBIVENT) 20-100 MCG/ACT AERS respimat Inhale 1 puff into the lungs every 6 (six) hours. 09/21/19   Amin, Ankit Chirag, MD  JANUMET 50-1000 MG tablet Take 1 tablet by mouth 2 (two) times daily. 10/30/19   [provider]  LANTUS 100 UNIT/ML injection Inject 13 Units into the skin daily. 11/23/19   [provider]  liraglutide (VICTOZA) 18 MG/3ML SOPN Inject 1.8 mg into the skin daily. 07/23/19   [provider]  lisinopril (ZESTRIL) 20 MG tablet Take 20 mg by mouth daily. 08/11/19 08/05/20  [provider]  Omega-3 Fatty Acids (FISH OIL) 1200 MG CAPS Take 1,200 mg by mouth daily.    [provider]    sennosides-docusate sodium (SENOKOT-S) 8.6-50 MG tablet Take 1 tablet by mouth daily.    [provider]  vancomycin IVPB Inject 1,000 mg into the vein every 12 (twelve) hours. Indication:  Coccygeal osteo/sacral decubitus ulcer  First Dose: Yes Last Day of Therapy:  01/21/2020 Labs - Sunday/Monday:  CBC/D, BMP, and vancomycin trough. Labs - Thursday:  BMP and vancomycin trough Labs - Every other week:  ESR and CRP Method of administration:Elastomeric Method of administration may be changed at the discretion of the patient and/or caregiver's ability to self-administer the medication ordered. 12/17/19 01/25/20  Dessa Phi, DO    Allergies    Chlorhexidine gluconate  Review of Systems   Review of Systems  Unable to perform ROS: Acuity of condition    Physical Exam Updated Vital Signs BP (!) 57/44   Pulse 65   Temp 99.8 F (37.7 C)   Resp 18  Ht '5\' 4"'  (1.626 m)   LMP  (LMP Unknown)   SpO2 100%   BMI 41.63 kg/m   Physical Exam Vitals and nursing note reviewed.  Constitutional:      Comments: Patient is intubated and minimally responsive but is intermittently biting the tube and EMS reported she tried to grab the tube and did open her eyes on command 1 time  HENT:     Head: Normocephalic and atraumatic.     Mouth/Throat:     Mouth: Mucous membranes are moist.  Eyes:     Conjunctiva/sclera: Conjunctivae normal.     Comments: Pupils are 2 mm and minimally reactive  Cardiovascular:     Rate and Rhythm: Normal rate.     Pulses: Normal pulses.     Comments: Strong pulse noted in bilateral dorsalis pedis and radials while on epi gtt Pulmonary:     Breath sounds: Normal breath sounds. No wheezing, rhonchi or rales.     Comments: Agonal breathing over the tube Abdominal:     General: Abdomen is flat.     Palpations: Abdomen is soft.  Musculoskeletal:     Cervical back: Normal range of motion and neck supple.     Comments: Large 5 x 6 cm sacral decubitus wound  that is deep and goes down to bone with purulent yellow material coming from the wound.  No significant surrounding erythema.  Small abrasion to the left knee.  PICC present in the left arm  Skin:    General: Skin is warm.     Capillary Refill: Capillary refill takes 2 to 3 seconds.  Neurological:     Comments: Prior to sedation and intubation patient was intermittently biting the tube.  EMS reported she did not open her eyes on command 1 time however she is not doing that upon arrival to the emergency room  Psychiatric:     Comments: Intubated and unresponsive     ED Results / Procedures / Treatments   Labs (all labs ordered are listed, but only abnormal results are displayed) Labs Reviewed  CBC WITH DIFFERENTIAL/PLATELET - Abnormal; Notable for the following components:      Result Value   WBC 11.3 (*)    RBC 3.67 (*)    Hemoglobin 10.2 (*)    MCHC 28.3 (*)    RDW 19.8 (*)    nRBC 0.4 (*)    Neutro Abs 8.4 (*)    Abs Immature Granulocytes 0.25 (*)    All other components within normal limits  PROTIME-INR - Abnormal; Notable for the following components:   Prothrombin Time 15.4 (*)    INR 1.3 (*)    All other components within normal limits  LACTIC ACID, PLASMA - Abnormal; Notable for the following components:   Lactic Acid, Venous 6.0 (*)    All other components within normal limits  D-DIMER, QUANTITATIVE (NOT AT [facility]) - Abnormal; Notable for the following components:   D-Dimer, Quant 3.32 (*)    All other components within normal limits  POCT I-STAT EG7 - Abnormal; Notable for the following components:   pCO2, Ven 83.4 (*)    pO2, Ven 138.0 (*)    Bicarbonate 46.5 (*)    TCO2 49 (*)    Acid-Base Excess 17.0 (*)    Sodium 134 (*)    Calcium, Ion 0.97 (*)    HCT 34.0 (*)    Hemoglobin 11.6 (*)    All other components within normal limits  POCT I-STAT 7, (LYTES, BLD  GAS, ICA,H+H) - Abnormal; Notable for the following components:   pCO2 arterial 64.6 (*)    pO2,  Arterial 272 (*)    Bicarbonate 42.4 (*)    TCO2 44 (*)    Acid-Base Excess 15.0 (*)    Potassium 2.8 (*)    Calcium, Ion 1.06 (*)    HCT 33.0 (*)    Hemoglobin 11.2 (*)    All other components within normal limits  CULTURE, BLOOD (ROUTINE X 2)  CULTURE, BLOOD (ROUTINE X 2)  RESPIRATORY PANEL BY RT PCR (FLU A&B, COVID)  URINE CULTURE  COMPREHENSIVE METABOLIC PANEL  VALPROIC ACID LEVEL  URINALYSIS, ROUTINE W REFLEX MICROSCOPIC  I-STAT VENOUS BLOOD GAS, ED  TROPONIN I (HIGH SENSITIVITY)    EKG EKG Interpretation  Date/Time:  Sunday Dec 20 2019 15:44:30 EDT Ventricular Rate:  59 PR Interval:    QRS Duration: 94 QT Interval:  437 QTC Calculation: 433 R Axis:   35 Text Interpretation: Sinus rhythm new Borderline repolarization abnormality Confirmed by Blanchie Dessert ([id number]) on 12/20/2019 4:12:22 PM   Radiology DG Chest Port 1 View  Result Date: 12/20/2019 CLINICAL DATA:  Cardiac arrest EXAM: PORTABLE CHEST 1 VIEW COMPARISON:  Chest radiograph dated 12/14/2019. FINDINGS: Defibrillator pads overlie the left chest. An endotracheal tube terminates in the upper thoracic trachea. An enteric tube enters the stomach and terminates below the field of view. The heart remains enlarged. Mild bibasilar atelectasis/airspace disease is noted. A small right pleural effusion may contribute. There is no left pleural effusion. There is no pneumothorax. IMPRESSION: Mild bibasilar atelectasis/airspace disease. A small right pleural effusion may contribute. Electronically Signed   By: Zerita Boers M.D.   On: 12/20/2019 16:17    Procedures Procedure Name: Intubation Date/Time: 12/20/2019 4:19 PM Performed by: Blanchie Dessert, MD Pre-anesthesia Checklist: Patient identified, Patient being monitored, Emergency Drugs available, Timeout performed and Suction available Oxygen Delivery Method: Non-rebreather mask Preoxygenation: Pre-oxygenation with 100% oxygen Induction Type: Rapid  sequence Ventilation: Mask ventilation without difficulty Laryngoscope Size: Glidescope and 3 Grade View: Grade II Tube size: 7.5 mm Airway Equipment and Method: Video-laryngoscopy Placement Confirmation: ETT inserted through vocal cords under direct vision,  CO2 detector and Breath sounds checked- equal and bilateral Secured at: 23 cm Tube secured with: ETT holder Dental Injury: Teeth and Oropharynx as per pre-operative assessment  Difficulty Due To: Difficulty was unanticipated      (including critical care time)  Medications Ordered in ED Medications  sodium chloride 0.9 % bolus 1,000 mL (has no administration in time range)  fentaNYL (SUBLIMAZE) injection 100 mcg (has no administration in time range)  fentaNYL (SUBLIMAZE) injection 100 mcg (has no administration in time range)  midazolam (VERSED) injection 1 mg (has no administration in time range)  midazolam (VERSED) injection 1 mg (has no administration in time range)    ED Course  I have reviewed the triage vital signs and the nursing notes.  Pertinent labs & imaging results that were available during my care of the patient were reviewed by me and considered in my medical decision making (see chart for details).    MDM Rules/Calculators/A&P                      Elderly female with multiple medical problems presenting today after a cardiac arrest at her living facility.  Patient only received a very short amount of CPR 1 to 2 minutes when she started agonal he breathing again.  Paramedics assisted respirations and intubated but she  never required compressions or pushes of epi.  Because of hypotension patient was started on an epi drip but oxygen saturation was 100% and heart rate was less than 100.  Patient did have some purposeful movement in route.  Here patient is agonal with her breathing and intermittently biting the tube but not following commands.  She did receive fentanyl and Versed before arrival due to biting the  tube.  She has recently been hospitalized for osteomyelitis of the sacrum and requiring I&D x2.  She is currently still on Unasyn and vancomycin.  Patient's EKG shows some borderline repolarization abnormality but no evidence of STEMI.  Patient did become hypotensive again requiring a Levophed drip.  She is also getting IV fluids.  Labs are pending.  Concern for possible hypercarbia as she does have a history of chronic respiratory failure, she is not on excessive sedating meds, potential dysrhythmia that resolved spontaneously with CPR at the facility, recurrent sepsis however patient is afebrile and still on antibiotics.  Lower suspicion for PE as patient is on Eliquis.  Will continue to monitor closely and plan on admission to ICU.  5:05 PM Patient's chest x-ray shows possible bibasilar atelectasis but appropriate ET tube placement and NG placement.  ABG shows compensated respiratory acidosis which is unchanged from her baseline with a CO2 of 65 and a bicarb of 42, minimal leukocytosis of 11,000 and stable hemoglobin of 10.  Patient's lactate is elevated at 6 and D-dimer is elevated at 3.32 however patient is taking Eliquis.  Troponin and CMP are still pending.  Patient is on 10 of Levophed however has a normal blood pressure and will continue to wean.  She will get a second liter of fluid.  She is currently on Unasyn and vancomycin but blood cultures were drawn.  ICU to admit.  MDM Number of Diagnoses or Management Options   Amount and/or Complexity of Data Reviewed Clinical lab tests: ordered and reviewed Tests in the radiology section of CPT: ordered and reviewed Tests in the medicine section of CPT: ordered and reviewed Decide to obtain previous medical records or to obtain history from someone other than the patient: yes Obtain history from someone other than the patient: yes Review and summarize past medical records: yes Discuss the patient with other providers: yes Independent  visualization of images, tracings, or specimens: yes  Risk of Complications, Morbidity, and/or Mortality Presenting problems: high Diagnostic procedures: moderate Management options: moderate  Critical Care Total time providing critical care: 30-74 minutes  Patient Progress Patient progress: improved  CRITICAL CARE Performed by: Namine Beahm Total critical care time: 40 minutes Critical care time was exclusive of separately billable procedures and treating other patients. Critical care was necessary to treat or prevent imminent or life-threatening deterioration. Critical care was time spent personally by me on the following activities: development of treatment plan with patient and/or surrogate as well as nursing, discussions with consultants, evaluation of patient's response to treatment, examination of patient, obtaining history from patient or surrogate, ordering and performing treatments and interventions, ordering and review of laboratory studies, ordering and review of radiographic studies, pulse oximetry and re-evaluation of patient's condition.   Final Clinical Impression(s) / ED Diagnoses Final diagnoses:  Acute respiratory failure, unspecified whether with hypoxia or hypercapnia ([place])  Cardiac arrest [facility])    Rx / DC Orders ED Discharge Orders    None       Blanchie Dessert, MD 12/20/19 1708

## 2019-12-20 NOTE — Progress Notes (Signed)
eLink Physician-Brief Progress Note Patient Name: Becky Hendricks DOB: 1946-03-21 MRN: [id number]   Date of Service  12/20/2019  HPI/Events of Note  47 F HTN, DM, afib, recent admission for left gluteal abscess s/p drain brought to ED after having arrested at nursing home requiring brief CPR. Appears to be in septic shock on norepinephrine.  Intubated 22/440/50%/5 PEEP  eICU Interventions  Septic shock secondary to osteomyelitis Correct electrolytes Sliding scale insulin Adjust vent pending ABG     Intervention Category Major Interventions: Sepsis - evaluation and management;Arrhythmia - evaluation and management;Electrolyte abnormality - evaluation and management;Shock - evaluation and management;Respiratory failure - evaluation and management Intermediate Interventions: Diagnostic test evaluation Evaluation Type: New Patient Evaluation  Darl Pikes 12/20/2019, 11:01 PM

## 2019-12-20 NOTE — Progress Notes (Addendum)
Pharmacy Antibiotic Note  Becky Hendricks is a 74 y.o. female admitted on 12/20/2019 with sepsis.  Pharmacy has been consulted for Meropenem and Vancomycin dosing.   Height: 5\' 4"  (162.6 cm) IBW/kg (Calculated) : 54.7  Temp (24hrs), Avg:99.6 F (37.6 C), Min:98.6 F (37 C), Max:100.2 F (37.9 C)  Recent Labs  Lab 12/15/19 0630 12/16/19 0449 12/17/19 1130 12/20/19 1543  WBC 12.0* 10.8*  --  11.3*  CREATININE 0.32* 0.48  --  0.74  LATICACIDVEN  --   --   --  6.0*  VANCOTROUGH  --   --  19  --     Estimated Creatinine Clearance: 77.1 mL/min (by C-G formula based on SCr of 0.74 mg/dL).    Allergies  Allergen Reactions  . Chlorhexidine Gluconate Itching    Antimicrobials this admission: 5/9 Meropenem >>  5/9 Vancomycin >>   Dose adjustments this admission:   Microbiology results: Pending   Plan:  - Patient was taking Unasyn and vancomycin 1g IV q12h as an outpatient for a sacral wound osteo.  - Will start Meropenem 1g IV q8h  - Patient received her last dose of Vancomycin on 5/9 @ 0400  - Baseline Scr is ~ 0.35 and currently is 0.74 - With increase in Scr and shortage Vancomycin level reagent will delay starting vancomycin inpatient until ~ 24 hours after last dose - Starting Vancomycin 1250mg  IV q24h Est calc AUC of 454 - Monitor urine output closely with Scr to determine worsening renal function - Thank you for allowing pharmacy to be a part of this patient's care.  7/9 PharmD. BCPS  12/20/2019 7:44 PM

## 2019-12-20 NOTE — ED Notes (Signed)
The levophed has been titrated according to bp n umerus times see quick notes  Last titration was now  53m

## 2019-12-20 NOTE — ED Notes (Signed)
Pt movinf around  Sedation given

## 2019-12-20 NOTE — Procedures (Signed)
Central Venous Catheter Insertion Procedure Note [address] [id number] [birth year]  Procedure: Insertion of Central Venous Catheter Indications: Assessment of intravascular volume, Drug and/or fluid administration and Frequent blood sampling  Procedure Details Consent: Unable to obtain consent because of altered level of consciousness. Time Out: Verified patient identification, verified procedure, site/side was marked, verified correct patient position, special equipment/implants available, medications/allergies/relevent history reviewed, required imaging and test results available.  Performed Real time [place] used to ID and cannulate vessel  Maximum sterile technique was used including antiseptics, cap, gloves, gown, hand hygiene, mask and sheet. Skin prep: Iodine solution; local anesthetic administered A antimicrobial bonded/coated triple lumen catheter was placed in the left internal jugular vein using the Seldinger technique.  Evaluation Blood flow good Complications: No apparent complications Patient did tolerate procedure well. I did not place [facility] disc due to her chlorhexidine allergy  Chest X-ray ordered to verify placement.  CXR: pending.  Shelby Mattocks 12/20/2019, 6:23 PM  Simonne Martinet ACNP-BC [facility] Pulmonary/Critical Care Pager # [phone] OR # [phone] if no answer

## 2019-12-21 ENCOUNTER — Inpatient Hospital Stay (HOSPITAL_COMMUNITY): Payer: Medicare Other

## 2019-12-21 DIAGNOSIS — J9601 Acute respiratory failure with hypoxia: Secondary | ICD-10-CM

## 2019-12-21 LAB — POCT I-STAT 7, (LYTES, BLD GAS, ICA,H+H)
Acid-Base Excess: 19 mmol/L — ABNORMAL HIGH (ref 0.0–2.0)
Acid-Base Excess: 19 mmol/L — ABNORMAL HIGH (ref 0.0–2.0)
Acid-Base Excess: 17 mmol/L — ABNORMAL HIGH (ref 0.0–2.0)
Bicarbonate: 42.1 mmol/L — ABNORMAL HIGH (ref 20.0–28.0)
Bicarbonate: 43 mmol/L — ABNORMAL HIGH (ref 20.0–28.0)
Bicarbonate: 41.2 mmol/L — ABNORMAL HIGH (ref 20.0–28.0)
Calcium, Ion: 1.05 mmol/L — ABNORMAL LOW (ref 1.15–1.40)
Calcium, Ion: 1.13 mmol/L — ABNORMAL LOW (ref 1.15–1.40)
Calcium, Ion: 1.11 mmol/L — ABNORMAL LOW (ref 1.15–1.40)
HCT: 31 % — ABNORMAL LOW (ref 36.0–46.0)
HCT: 29 % — ABNORMAL LOW (ref 36.0–46.0)
HCT: 28 % — ABNORMAL LOW (ref 36.0–46.0)
Hemoglobin: 10.5 g/dL — ABNORMAL LOW (ref 12.0–15.0)
Hemoglobin: 9.9 g/dL — ABNORMAL LOW (ref 12.0–15.0)
Hemoglobin: 9.5 g/dL — ABNORMAL LOW (ref 12.0–15.0)
O2 Saturation: 99 %
O2 Saturation: 98 %
O2 Saturation: 99 %
Patient temperature: 37.9
Patient temperature: 37.6
Patient temperature: 37.1
Potassium: 2.7 mmol/L — CL (ref 3.5–5.1)
Potassium: 3.8 mmol/L (ref 3.5–5.1)
Potassium: 3.7 mmol/L (ref 3.5–5.1)
Sodium: 138 mmol/L (ref 135–145)
Sodium: 137 mmol/L (ref 135–145)
Sodium: 135 mmol/L (ref 135–145)
TCO2: 43 mmol/L — ABNORMAL HIGH (ref 22–32)
TCO2: 44 mmol/L — ABNORMAL HIGH (ref 22–32)
TCO2: 43 mmol/L — ABNORMAL HIGH (ref 22–32)
pCO2 arterial: 42.3 mmHg (ref 32.0–48.0)
pCO2 arterial: 47.2 mmHg (ref 32.0–48.0)
pCO2 arterial: 45.8 mmHg (ref 32.0–48.0)
pH, Arterial: 7.609 (ref 7.350–7.450)
pH, Arterial: 7.569 — ABNORMAL HIGH (ref 7.350–7.450)
pH, Arterial: 7.563 — ABNORMAL HIGH (ref 7.350–7.450)
pO2, Arterial: 119 mmHg — ABNORMAL HIGH (ref 83.0–108.0)
pO2, Arterial: 98 mmHg (ref 83.0–108.0)
pO2, Arterial: 119 mmHg — ABNORMAL HIGH (ref 83.0–108.0)

## 2019-12-21 LAB — BASIC METABOLIC PANEL
Anion gap: 11 (ref 5–15)
Anion gap: 14 (ref 5–15)
BUN: 21 mg/dL (ref 8–23)
BUN: 20 mg/dL (ref 8–23)
CO2: 36 mmol/L — ABNORMAL HIGH (ref 22–32)
CO2: 33 mmol/L — ABNORMAL HIGH (ref 22–32)
Calcium: 8.1 mg/dL — ABNORMAL LOW (ref 8.9–10.3)
Calcium: 8 mg/dL — ABNORMAL LOW (ref 8.9–10.3)
Chloride: 93 mmol/L — ABNORMAL LOW (ref 98–111)
Chloride: 91 mmol/L — ABNORMAL LOW (ref 98–111)
Creatinine, Ser: 0.53 mg/dL (ref 0.44–1.00)
Creatinine, Ser: 0.43 mg/dL — ABNORMAL LOW (ref 0.44–1.00)
GFR calc Af Amer: >60 mL/min (ref >60)
GFR calc Af Amer: >60 mL/min (ref >60)
GFR calc non Af Amer: >60 mL/min (ref >60)
GFR calc non Af Amer: >60 mL/min (ref >60)
Glucose, Bld: 99 mg/dL (ref 70–99)
Glucose, Bld: 191 mg/dL — ABNORMAL HIGH (ref 70–99)
Potassium: 3.9 mmol/L (ref 3.5–5.1)
Potassium: 3.6 mmol/L (ref 3.5–5.1)
Sodium: 140 mmol/L (ref 135–145)
Sodium: 138 mmol/L (ref 135–145)

## 2019-12-21 LAB — GLUCOSE, CAPILLARY
Glucose-Capillary: 105 mg/dL — ABNORMAL HIGH (ref 70–99)
Glucose-Capillary: 111 mg/dL — ABNORMAL HIGH (ref 70–99)
Glucose-Capillary: 114 mg/dL — ABNORMAL HIGH (ref 70–99)
Glucose-Capillary: 126 mg/dL — ABNORMAL HIGH (ref 70–99)
Glucose-Capillary: 157 mg/dL — ABNORMAL HIGH (ref 70–99)
Glucose-Capillary: 204 mg/dL — ABNORMAL HIGH (ref 70–99)

## 2019-12-21 LAB — CBC
HCT: 31 % — ABNORMAL LOW (ref 36.0–46.0)
Hemoglobin: 9.2 g/dL — ABNORMAL LOW (ref 12.0–15.0)
MCH: 28.2 pg (ref 26.0–34.0)
MCHC: 29.7 g/dL — ABNORMAL LOW (ref 30.0–36.0)
MCV: 95.1 fL (ref 80.0–100.0)
Platelets: 207 10*3/uL (ref 150–400)
RBC: 3.26 MIL/uL — ABNORMAL LOW (ref 3.87–5.11)
RDW: 20.6 % — ABNORMAL HIGH (ref 11.5–15.5)
WBC: 14.2 10*3/uL — ABNORMAL HIGH (ref 4.0–10.5)
nRBC: 0.1 % (ref 0.0–0.2)

## 2019-12-21 LAB — MAGNESIUM
Magnesium: 1.5 mg/dL — ABNORMAL LOW (ref 1.7–2.4)
Magnesium: 2.4 mg/dL (ref 1.7–2.4)

## 2019-12-21 LAB — URINE CULTURE: Culture: NO GROWTH

## 2019-12-21 LAB — PHOSPHORUS
Phosphorus: 1.6 mg/dL — ABNORMAL LOW (ref 2.5–4.6)
Phosphorus: 5.3 mg/dL — ABNORMAL HIGH (ref 2.5–4.6)

## 2019-12-21 IMAGING — DX DG CHEST 1V PORT
1 series · 1 of 1 positions shown · non-contrast
Comparison: Portable exam [9C] hours compared to [DATE]

CLINICAL DATA: Shortness of breath, sepsis, respiratory failure

EXAM:
PORTABLE CHEST 1 VIEW

[chest ap]
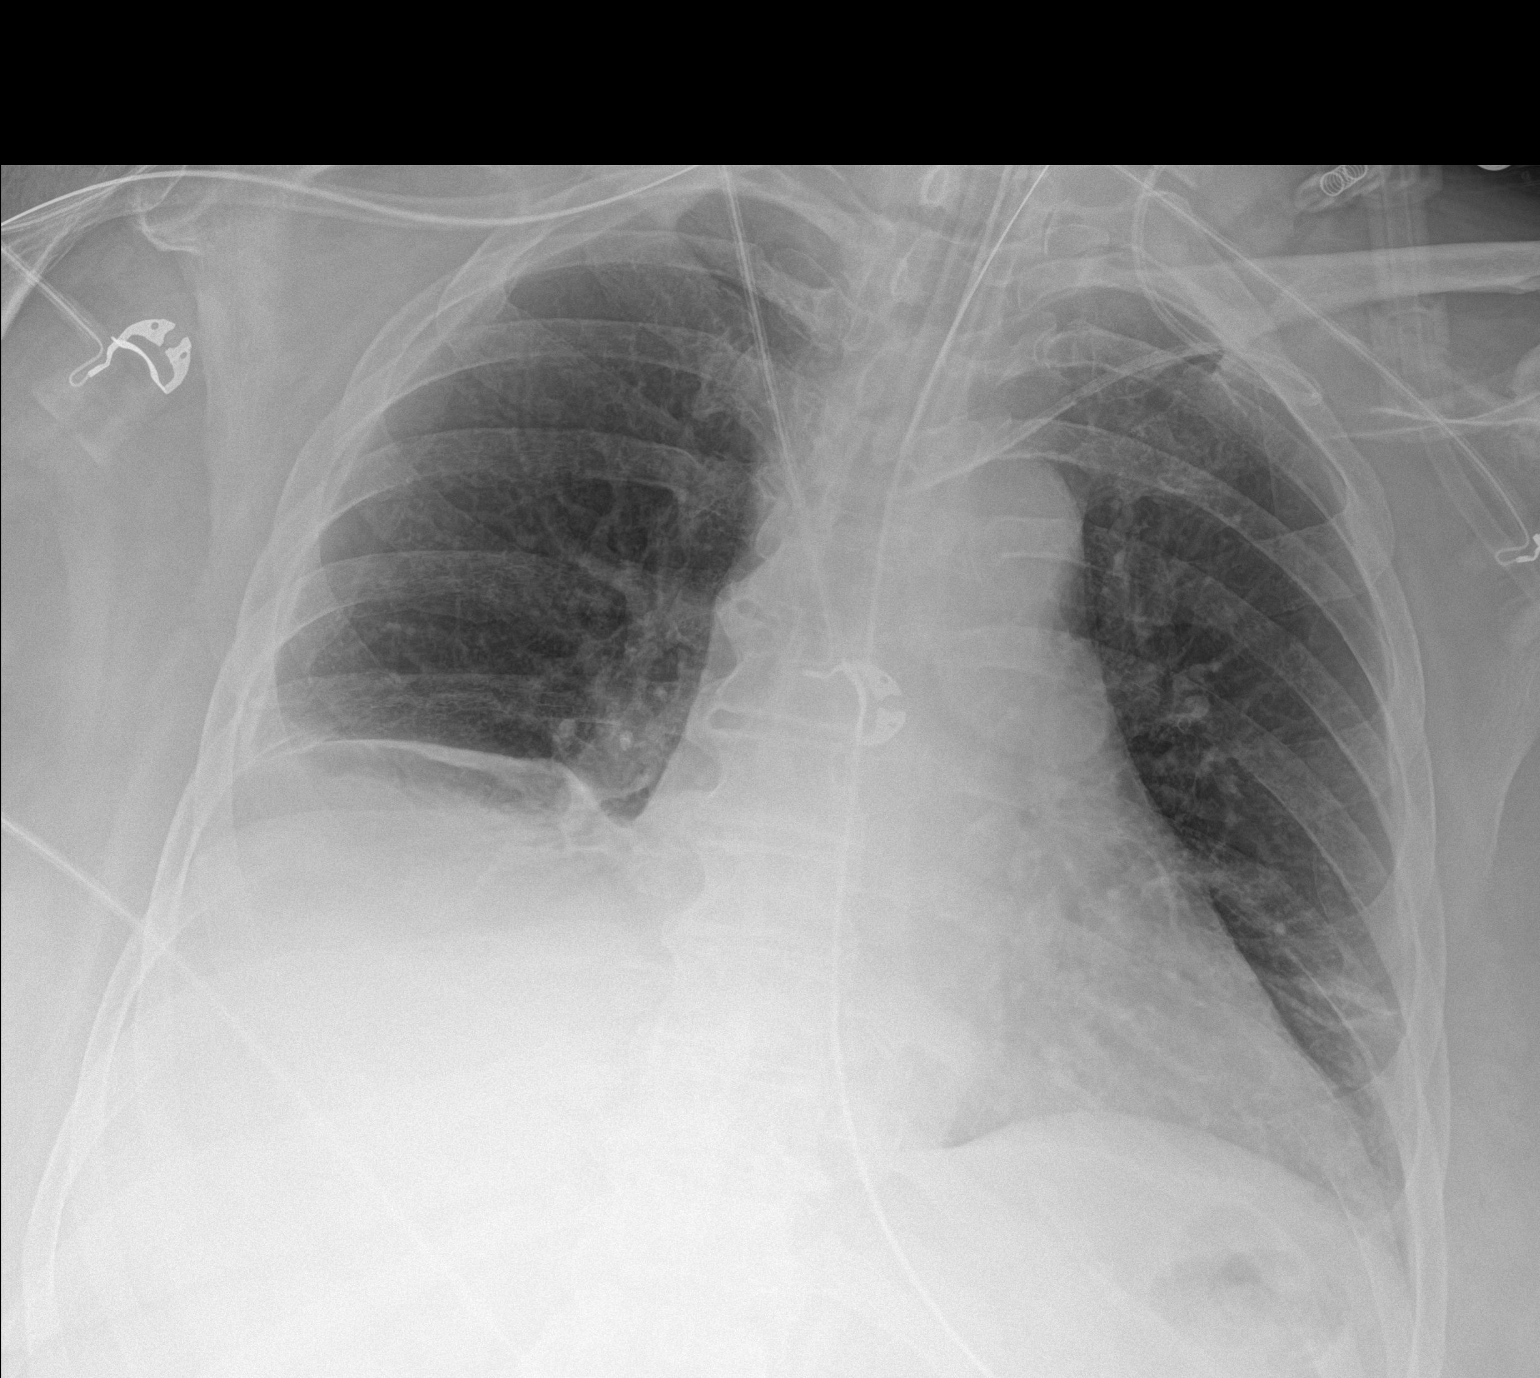

[1 of 1 positions shown; findings below may reference images not displayed]

FINDINGS: Tip of endotracheal tube projects 3.6 cm above carina.

Nasogastric tube extends into stomach.

LEFT subclavian central venous catheter with tip projecting over
LEFT brachiocephalic vein near SVC confluence.

Normal heart size mediastinal contours.

Bibasilar atelectasis greater on RIGHT.

Small RIGHT pleural effusion.

No pneumothorax.
IMPRESSION: Bibasilar atelectasis greater on RIGHT with small associated RIGHT
pleural effusion.

## 2019-12-21 MED ORDER — VANCOMYCIN HCL IN DEXTROSE 1-5 GM/200ML-% IV SOLN
1000.0000 mg | Freq: Two times a day (BID) | INTRAVENOUS | Status: DC
  Filled 2019-12-21: qty 200

## 2019-12-21 MED ORDER — CALCIUM GLUCONATE-NACL 1-0.675 GM/50ML-% IV SOLN
1.0000 g | Freq: Once | INTRAVENOUS | Status: AC
  Administered 2019-12-21: 1000 mg via INTRAVENOUS
  Filled 2019-12-21: qty 50

## 2019-12-21 MED ORDER — APIXABAN 5 MG PO TABS
5.0000 mg | ORAL_TABLET | Freq: Two times a day (BID) | ORAL | Status: DC
  Administered 2019-12-21 – 2019-12-22 (×3): 5 mg
  Filled 2019-12-21 (×3): qty 1

## 2019-12-21 MED ORDER — CALCIUM CARBONATE ANTACID 1250 MG/5ML PO SUSP
500.0000 mg | Freq: Three times a day (TID) | ORAL | Status: DC
  Administered 2019-12-21 – 2019-12-22 (×2): 500 mg
  Filled 2019-12-21 (×2): qty 5

## 2019-12-21 MED ORDER — MUPIROCIN 2 % EX OINT
1.0000 "application " | TOPICAL_OINTMENT | Freq: Two times a day (BID) | CUTANEOUS | Status: AC
  Administered 2019-12-21 – 2019-12-25 (×10): 1 via NASAL
  Filled 2019-12-21 (×3): qty 22

## 2019-12-21 MED ORDER — ORAL CARE MOUTH RINSE
15.0000 mL | OROMUCOSAL | Status: DC
  Administered 2019-12-21 – 2019-12-22 (×14): 15 mL via OROMUCOSAL

## 2019-12-21 MED ORDER — VITAL HIGH PROTEIN PO LIQD
1000.0000 mL | ORAL | Status: DC
  Administered 2019-12-21: 1000 mL

## 2019-12-21 MED ORDER — MAGNESIUM SULFATE 2 GM/50ML IV SOLN
2.0000 g | Freq: Once | INTRAVENOUS | Status: AC
  Administered 2019-12-21: 2 g via INTRAVENOUS
  Filled 2019-12-21: qty 50

## 2019-12-21 MED ORDER — VANCOMYCIN HCL IN DEXTROSE 1-5 GM/200ML-% IV SOLN
1000.0000 mg | Freq: Two times a day (BID) | INTRAVENOUS | Status: DC
  Administered 2019-12-21 – 2019-12-23 (×5): 1000 mg via INTRAVENOUS
  Filled 2019-12-21 (×7): qty 200

## 2019-12-21 MED ORDER — ATORVASTATIN CALCIUM 10 MG PO TABS
10.0000 mg | ORAL_TABLET | Freq: Every day | ORAL | Status: DC
  Administered 2019-12-21 – 2019-12-22 (×2): 10 mg
  Filled 2019-12-21 (×2): qty 1

## 2019-12-21 MED ORDER — POTASSIUM PHOSPHATES 15 MMOLE/5ML IV SOLN
45.0000 mmol | Freq: Once | INTRAVENOUS | Status: AC
  Administered 2019-12-21: 45 mmol via INTRAVENOUS
  Filled 2019-12-21: qty 15

## 2019-12-21 MED ORDER — SENNOSIDES-DOCUSATE SODIUM 8.6-50 MG PO TABS
1.0000 | ORAL_TABLET | Freq: Every day | ORAL | Status: DC
  Administered 2019-12-21 – 2019-12-22 (×2): 1
  Filled 2019-12-21 (×2): qty 1

## 2019-12-21 NOTE — Procedures (Signed)
Patient Name: Becky Hendricks  MRN: [id number]  Epilepsy Attending: Charlsie Quest  Referring Physician/Provider: Zenia Resides, NP Date: 12/21/2019 Duration: 24.28 mins  Patient history: 74 yo F with h/o AF on Eliquis, chronic respiratory failure on 2L home O2, and stage IV decubitus ulcer presented to [facility] after out of hospital cardiopulmonary arrest. EEG to evaluate for seizure.  Level of alertness: Awake  AEDs during EEG study: Awake, sleep  Technical aspects: This EEG study was done with scalp electrodes positioned according to the 10-20 International system of electrode placement. Electrical activity was acquired at a sampling rate of 500Hz  and reviewed with a high frequency filter of 70Hz  and a low frequency filter of 1Hz . EEG data were recorded continuously and digitally stored.   Description: During awake state, no clear posterior rhythm was seen.  Sleep was characterized by vertex waves, maximal frontocentral region.  EEG showed continuous generalized 3 to 5 Hz theta-delta slowing.  Hyperventilation and photic stimulation were not performed.  Abnormality -Continuous slow, generalized  IMPRESSION: This study is suggestive of moderate to severe diffuse encephalopathy, nonspecific etiology. No seizures or definite epileptiform discharges were seen throughout the recording.  Becky Hendricks 

## 2019-12-21 NOTE — Progress Notes (Signed)
NAME:  Becky Hendricks, MRN:  [id number], DOB:  06/02/1946, LOS: 1 ADMISSION DATE:  12/20/2019, CONSULTATION DATE:  12/20/19 REFERRING MD:  Anitra Lauth MD, ED, CHIEF COMPLAINT:  Cardiopulmonary arrest  Brief History   Becky Hendricks is a 74 yo F with h/o AF on Eliquis, bipolar disorder, HTN, T2DM, chronic respiratory failure on 2L home o2 who presented after out of hospital cardiopulmonary arrest. Pt was recently discharged on 12/17/19 stage IV pressure ulcer to be on Unasyn + Vanc until 6/10. On 5/9, staff at the nursing home reported she didn't "look right" at lunch. She was later found in her room agonally breathing without a pulse. She underwent 1-2 minutes of CPR with ROSC. She was intubated in the field and brought to [facility] ED. In the ED, SpO2 100 intubated, BP 120s/90s on epi, blood glucose 300, lactate 6. WBC 11.3 from 10.8 on discharge. ABG was 7.42/64/272/42. Troponin 76 and 161 six hours later. Respiratory panel negative, EKG showed NSR without ischemic changes. She was minimally responsive despite no sedation; she was started on Levophed for BP support and transitioned to Meropenem and admitted to [facility].   Past Medical History  Chronic respiratory failure on 2 L nasal cannula Obesity  type 2 diabetes mellitus Hypertension baseline bipolar disease w/ some baseline confusion stage IV pressure ulcer involving the gluteal fold with bilateral ulcers on the right and left buttocks with osteomyelitis of the coccyx. Recently discharged 5/6 on Unasyn and vancomycin  Significant Hospital Events   5/9: Brief out of hospital cardiopulmonary arrest; ETT ; admitted   Consults:  PCCM  Procedures:  5/9: ETT in field   Significant Diagnostic Tests:  5/9: CT Head wo contrast: 1. No acute intracranial pathology. 2. Age-related atrophy and chronic microvascular ischemic changes.  5/9: CT Abd Pelvis w Contrast: 1. 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 2. Stable  decubitus ulcer with osteomyelitis involving distal sacrum and coccyx. 3. Cholelithiasis. No radiographic evidence of cholecystitis. 4. Stable small uterine fibroids. 5. Increased small right pleural effusion and bibasilar atelectasis.  5/9: CXR: 1. Left internal jugular catheter coiled over the left subclavian vein, with tip normally positioned over the superior vena cava. Please correlate with catheter function. 2. Increasing right basilar consolidation and/or effusion.  Micro Data:  BC x2 NG <24 hours  Urine culture NG  Respiratory panel: negative SARS-CoV-2/Influenza A/Influenza B   Antimicrobials:  4/22 - 4/28: Vanc + Cefepime + lagyl  4/28 - 5/9: Vanc + Unasyn (to be continued until 01/21/20) 5/9 - present: Vanc + Meropenem    Interim history/subjective:  NAE since admission. SpO2 100 on minimal 440/40%/+5; pt failed SBT this morning as she continued to take low volume breaths. VSS, Tmax 37.9, BP 100s/50s on Levophed (was 0 however pt became hypotensive), HR 50s. Morning labs significant for WBC 14.2 from 11.3, K3.8, lactate 3.2 from 6. CXR and CT abd as above.   Objective   Blood pressure (!) 102/58, pulse (!) 48, temperature 99 F (37.2 C), temperature source Bladder, resp. rate 14, height 5\' 4"  (1.626 m), weight 94.7 kg, SpO2 99 %.    Vent Mode: PRVC FiO2 (%):  [40 %-100 %] 40 % Set Rate:  [15 bmp-22 bmp] 15 bmp Vt Set:  [440 mL] 440 mL PEEP:  [5 cmH20] 5 cmH20 Plateau Pressure:  [15 cmH20-19 cmH20] 15 cmH20   Intake/Output Summary (Last 24 hours) at 12/21/2019 0857 Last data filed at 12/21/2019 0800 Gross per 24  hour  Intake 2005.59 ml  Output 335 ml  Net 1670.59 ml   Filed Weights   12/21/19 0500  Weight: 94.7 kg    Examination: Gen: Elderly woman in bed, ill appearing.  HEENT: atraumatic, normocephalic, sclera anicteric, EOMI, PERRL  Neck: no cervical lymphadenopathy or thyromegaly, no JVD Heart: RRR, S1, S2, no M/R/G, no chest wall tenderness Lungs:  CTAB, no crackles or wheezes, mechanical breath sounds Abdomen: Normoactive bowel sounds, soft, NTND, no rebound/guarding Extremities: no clubbing, cyanosis, or edema: pulses are +2 in bilateral upper and lower extremities Neuro: Opens eyes to voice, follows commands. CN II-XII grossly intact.  Skin:  No rashes, lesions on clothed exam   Resolved Hospital Problem list   none  Assessment & Plan:  74 yo F with h/o AF on Eliquis, chronic respiratory failure on 2L home O2, and stage IV decubitus ulcer presented to [facility] after out of hospital cardiopulmonary arrest.   Cardiopulmonary Arrest: Pt had cardiopulmonary arrest on 5/9 at her nursing home requiring 1-2 minutes of CPR before ROSC. At this point, most likely d/t sepsis/shock. Pt has had worrisome hypotension on pressors in the setting of osteomyelitis and stage IV polymicrobial ulcer. Also considered PE, however less likely given she is compliant with Eliquis. Primary pulmonary cause would be unlikely; CXR did show some bibasilar infiltrate so we will repeat CXR to be sure. Primary CV event also unlikely given normal echo on 4/27 (LVEF 60-65%, no structural heart disease) and only mildly elevated troponin c/w demand ischemia. Pt failed breathing trial on 5/10 AM as she continued to take in very low tidal volumes.  - Continue Vent support  - Not extubatable today. Continue SBTs  - Tele   Sepsis/Shock: Most likely 2/2 osteomyelitis of sacrum/coccyx, though alternate soures include polymicrobial decubitus ulcer and strep viridans bacteremia found last week. Pt is being adequately treated for these on Unasyn and Vanc, however, so her decompensation is unusual. CT abd pelvis showed significant decrease in size of left buttock fistula and abscess since previous study; stable decubitus ulcer with osteomyelitis involving distal sacrum and coccyx. CXR showed some bibasilar infiltrate, which could be a new PNA however this feels unlikely. We will repeat a CXR  to r/o. She is s/p 1 L NS bolus and mIVF LR; Levophed has been downtitrated from 10 to 2 (was off briefly with some hypotension). Lactate downtrending. Pt does have midline from prior hospitalization which we will remove as a potential nidus of infection.  - BC x2 NGTD  - Continue Levophed, titrate for MAP >65  - Continue Merepenem + Vanc for now ; we will likely narrow back to Unasyn + Vanc for prespecified time frame  - Repeat CXR  - Remove midline   Acute Metabolic Encephalopathy: CT head without acute changes. Likely related to her cardiopulmonary arrest. She is now A&O and mentating well.   Pressure Ulcer  Osteomyelitis: Pt has stage IV pressure ulcer recently discharged on 12/17/19 on Unasyn and Vanc until 01/21/20, as above.  - Wound care consulted, appreciate recs  - Antibx as above   Atrial Fibrillation: Pt remains in NSR.  - Restart home Eliquis   Bipolar Disorder:  - Continue home Depakene   T2DM:  -SSI  - Restart home atorvastatin   FEN: Will aggressively replete lytes given sepsis.  - Replete K, Phos, Mg, Ca  - LR mIVF - Consult to dietary for tube feeds  - Senna, miralax      Best practice:  Diet: Tube  feeds  Pain/Anxiety/Delirium protocol (if indicated): Fentanyl VAP protocol (if indicated): per protocol DVT prophylaxis: Eliquis  GI prophylaxis: Protonix Glucose control: SSI Mobility: bed bound Code Status: FULL Family Communication: Will reach out today Disposition: ICU    Critical care time:      [place], MS4

## 2019-12-21 NOTE — Consult Note (Signed)
WOC Nurse Consult Note: Patient receiving care in [facility] 2M02.  Consult completed remotely after review of record. Reason for Consult: stage 4 wounds with tunneling Wound type: Chronic, non-healing, osteomyelitis Pressure Injury POA: Yes Measurement: To be provided by the bedside RN in the flowsheet section Wound bed: To be provided by the bedside RN in the flowsheet section Drainage (amount, consistency, odor) see flowsheet section for wound details Periwound: see flowsheet Dressing procedure/placement/frequency: twice daily insertion of saline moistened gauze into left buttock and sacral wounds, cover with ABD pads.  Patient was discharged on 12/17/19, care provided at [facility]. Found with agonal respirations on 12/20/19, returned to our facility via EMS for continued support.  Monitor the wound area(s) for worsening of condition such as: Signs/symptoms of infection,  Increase in size,  Development of or worsening of odor, Development of pain, or increased pain at the affected locations.  Notify the medical team if any of these develop.  Thank you for the consult. WOC nurse will not follow at this time.  Please re-consult the WOC team if needed.  Helmut Muster, RN, MSN, CWOCN, CNS-BC, pager [phone]

## 2019-12-21 NOTE — Progress Notes (Signed)
eLink Physician-Brief Progress Note Patient Name: Cherina Dhillon DOB: [birth year] MRN: [id number]   Date of Service  12/21/2019  HPI/Events of Note  Multiple issues: 1. Sinus bradycardia - HR = 51. Etiology uncertain and 2. Hyperphosphatemia - PO4--- = 5.3 and Ca++ =   eICU Interventions  Will order: 1. Calcium carbonate per tube TID x 3 doses (phosphate binder) 2. Continue to monitor HR.     Intervention Category Major Interventions: Electrolyte abnormality - evaluation and management;Arrhythmia - evaluation and management  Keaten Mashek Eugene 12/21/2019, 8:46 PM

## 2019-12-21 NOTE — Progress Notes (Signed)
EEG completed, results pending. 

## 2019-12-21 NOTE — Progress Notes (Signed)
Phosphorus replaced per protocol, follow up labs ordered

## 2019-12-21 NOTE — Progress Notes (Signed)
Pharmacy Antibiotic Note  Becky Hendricks is a 74 y.o. female admitted on 12/20/2019 with AMS and brief cardiac arrest.  Pharmacy has been consulted for vancomycin and Merrem dosing for sepsis.  Patient has osteomyelitis/bascess of the coccyx on vancomycin and Unasyn PTA.  CT this admission showed decrease in size of left buttock fistula and abscess.   Renal function is improving, Tmax 100.1, WBC 14.2, LA 3.2, PCT < 0.1.  Plan: Change vanc back to home dose of 1000mg  IV Q12H Merrem 1gm IV Q8H Monitor renal fxn, clinical progress, vanc trough at Css F/U with narrowing abx  Height: 5\' 4"  (162.6 cm) Weight: 94.7 kg (208 lb 12.4 oz) IBW/kg (Calculated) : 54.7  Temp (24hrs), Avg:99.7 F (37.6 C), Min:98.6 F (37 C), Max:100.2 F (37.9 C)  Recent Labs  Lab 12/15/19 0630 12/16/19 0449 12/17/19 1130 12/20/19 1543 12/20/19 1920 12/20/19 2120 12/21/19 0409  WBC 12.0* 10.8*  --  11.3*  --   --  14.2*  CREATININE 0.32* 0.48  --  0.74 0.61  --  0.53  LATICACIDVEN  --   --   --  6.0* 3.9* 3.2*  --   VANCOTROUGH  --   --  19  --   --   --   --     Estimated Creatinine Clearance: 70.9 mL/min (by C-G formula based on SCr of 0.53 mg/dL).    Allergies  Allergen Reactions  . Chlorhexidine Gluconate Itching    Vanc PTA >> (6/10) Merrem 5/9 >>  5/9 covid / flu - negative 5/9 UCx -  5/9  BCx -   Data from previous admit: Vanc 4/22 >>(6/10) Flagyl 4/22 x1; 4/24 >>4/28 Cefepime 4/22 >>4/28 Unasyn 4/28 >>(6/10)  4/25 VT = 15 >> continue 5/2 VT 8 - adjust to 1g q12 5/6 corrected VT~16 - cont current dose  4/22 BC: 1/3 viridan strep 4/22 UCx >>neg 4/23 L-buttock abscess: rare Staphylococcus warneri, Strep anginosis 4/23 MRSA PCR - positive   Vondra Aldredge D. 5/23, PharmD, BCPS, BCCCP 12/21/2019, 7:28 AM

## 2019-12-21 NOTE — Progress Notes (Signed)
Initial Nutrition Assessment  DOCUMENTATION CODES:   Obesity unspecified  INTERVENTION:     Vital High Protein at 55 ml/h (1320 ml per day)   Provides 1320 kcal, 116 gm protein, 1104 ml free water daily  NUTRITION DIAGNOSIS:   Increased nutrient needs related to wound healing as evidenced by estimated needs.  GOAL:   Provide needs based on ASPEN/SCCM guidelines  MONITOR:   Vent status, TF tolerance, Skin, Labs  REASON FOR ASSESSMENT:   Ventilator, Consult Enteral/tube feeding initiation and management  ASSESSMENT:   74 yo female admitted with septic shock, required brief CPR at SNF, intubated by EMS. PMH includes A fib, bipolar D/O, C diff colitis, HTN, obesity, DM-2, stage 4 pressure injury, L buttock abscess.   Discussed patient with RN today. Per RN discussion with MD, okay to begin TF today per RD. OG tube in place.   Patient is currently intubated on ventilator support MV: 4.5 L/min Temp (24hrs), Avg:99.6 F (37.6 C), Min:98.6 F (37 C), Max:100.2 F (37.9 C)   Labs reviewed. K 3.8 WNL today up from 2.7 last night. CBG's: 111-114  Medications reviewed and include novolog, levophed, potassium phosphate. Received mag sulfate early AM.  I/O + 2 L since admission Per review of weight encounters, patient weighed 110 kg on 12/10/19, currently 94.7 kg. 14% weight loss is significant for < 1 month.   NUTRITION - FOCUSED PHYSICAL EXAM:    Most Recent Value  Orbital Region  No depletion  Upper Arm Region  No depletion  Thoracic and Lumbar Region  No depletion  Buccal Region  Unable to assess  Temple Region  No depletion  Clavicle Bone Region  No depletion  Clavicle and Acromion Bone Region  No depletion  Scapular Bone Region  Unable to assess  Dorsal Hand  Unable to assess  Patellar Region  Unable to assess  Anterior Thigh Region  Unable to assess  Posterior Calf Region  Unable to assess  Edema (RD Assessment)  Mild [per RN assessment]  Hair  Reviewed   Eyes  Unable to assess  Mouth  Unable to assess  Skin  Reviewed  Nails  Reviewed       Diet Order:   Diet Order            Diet NPO time specified  Diet effective now              EDUCATION NEEDS:   Not appropriate for education at this time  Skin:  Skin Assessment: Skin Integrity Issues: Skin Integrity Issues:: Stage IV Stage IV: buttocks with tunneling  Last BM:  no BM documented  Height:   Ht Readings from Last 1 Encounters:  12/20/19 5\' 4"  (1.626 m)    Weight:   Wt Readings from Last 1 Encounters:  12/21/19 94.7 kg    Ideal Body Weight:  54.5 kg  BMI:  Body mass index is 35.84 kg/m.  Estimated Nutritional Needs:   Kcal:  02/20/20  Protein:  110-130 gm  Fluid:  >/= 1.8 L    [id number], RD, LDN, CNSC Please refer to Amion for contact information.

## 2019-12-21 NOTE — Progress Notes (Signed)
RN contacted brother, Laryssa Hassing, this evening. Brother unaware that patient had been transferred to [facility] from [facility]. Brother updated on patients status. Visitation policy reviewed with brother. All questions answered. Pt lives in [place]. Plans on visiting patient tomorrow.

## 2019-12-21 NOTE — Progress Notes (Signed)
Called unit and spoke with pt's nurse, Misty Stanley. She is aware of order to dc midline and will take care of it.

## 2019-12-21 NOTE — Plan of Care (Signed)
  Problem: Elimination: Goal: Will not experience complications related to urinary retention Outcome: Progressing   Problem: Pain Managment: Goal: General experience of comfort will improve Outcome: Progressing   Problem: Respiratory: Goal: Ability to maintain a clear airway and adequate ventilation will improve Outcome: Progressing   Problem: Activity: Goal: Risk for activity intolerance will decrease Outcome: Not Progressing Note: Pt intubated on ventilator. Unable to mobilize at this time.   Problem: Nutrition: Goal: Adequate nutrition will be maintained Outcome: Not Progressing Note: Pt admitted last night, 5/9. Tube feedings not started. Will discuss with MD today.

## 2019-12-22 DIAGNOSIS — L0231 Cutaneous abscess of buttock: Secondary | ICD-10-CM

## 2019-12-22 DIAGNOSIS — G9341 Metabolic encephalopathy: Secondary | ICD-10-CM

## 2019-12-22 DIAGNOSIS — I480 Paroxysmal atrial fibrillation: Secondary | ICD-10-CM

## 2019-12-22 LAB — BASIC METABOLIC PANEL
Anion gap: 10 (ref 5–15)
Anion gap: 11 (ref 5–15)
BUN: 18 mg/dL (ref 8–23)
BUN: 11 mg/dL (ref 8–23)
CO2: 34 mmol/L — ABNORMAL HIGH (ref 22–32)
CO2: 33 mmol/L — ABNORMAL HIGH (ref 22–32)
Calcium: 7.8 mg/dL — ABNORMAL LOW (ref 8.9–10.3)
Calcium: 8 mg/dL — ABNORMAL LOW (ref 8.9–10.3)
Chloride: 94 mmol/L — ABNORMAL LOW (ref 98–111)
Chloride: 95 mmol/L — ABNORMAL LOW (ref 98–111)
Creatinine, Ser: 0.35 mg/dL — ABNORMAL LOW (ref 0.44–1.00)
Creatinine, Ser: 0.36 mg/dL — ABNORMAL LOW (ref 0.44–1.00)
GFR calc Af Amer: >60 mL/min (ref >60)
GFR calc Af Amer: >60 mL/min (ref >60)
GFR calc non Af Amer: >60 mL/min (ref >60)
GFR calc non Af Amer: >60 mL/min (ref >60)
Glucose, Bld: 202 mg/dL — ABNORMAL HIGH (ref 70–99)
Glucose, Bld: 150 mg/dL — ABNORMAL HIGH (ref 70–99)
Potassium: 3.6 mmol/L (ref 3.5–5.1)
Potassium: 5 mmol/L (ref 3.5–5.1)
Sodium: 138 mmol/L (ref 135–145)
Sodium: 139 mmol/L (ref 135–145)

## 2019-12-22 LAB — POCT I-STAT 7, (LYTES, BLD GAS, ICA,H+H)
Acid-Base Excess: 14 mmol/L — ABNORMAL HIGH (ref 0.0–2.0)
Bicarbonate: 38.9 mmol/L — ABNORMAL HIGH (ref 20.0–28.0)
Calcium, Ion: 1.13 mmol/L — ABNORMAL LOW (ref 1.15–1.40)
HCT: 27 % — ABNORMAL LOW (ref 36.0–46.0)
Hemoglobin: 9.2 g/dL — ABNORMAL LOW (ref 12.0–15.0)
O2 Saturation: 99 %
Patient temperature: 37
Potassium: 4 mmol/L (ref 3.5–5.1)
Sodium: 136 mmol/L (ref 135–145)
TCO2: 41 mmol/L — ABNORMAL HIGH (ref 22–32)
pCO2 arterial: 52 mmHg — ABNORMAL HIGH (ref 32.0–48.0)
pH, Arterial: 7.483 — ABNORMAL HIGH (ref 7.350–7.450)
pO2, Arterial: 118 mmHg — ABNORMAL HIGH (ref 83.0–108.0)

## 2019-12-22 LAB — CBC WITH DIFFERENTIAL/PLATELET
Abs Immature Granulocytes: 0.06 10*3/uL (ref 0.00–0.07)
Basophils Absolute: 0 10*3/uL (ref 0.0–0.1)
Basophils Relative: 0 %
Eosinophils Absolute: 0.2 10*3/uL (ref 0.0–0.5)
Eosinophils Relative: 2 %
HCT: 27.9 % — ABNORMAL LOW (ref 36.0–46.0)
Hemoglobin: 8.3 g/dL — ABNORMAL LOW (ref 12.0–15.0)
Immature Granulocytes: 1 %
Lymphocytes Relative: 17 %
Lymphs Abs: 2 10*3/uL (ref 0.7–4.0)
MCH: 28.5 pg (ref 26.0–34.0)
MCHC: 29.7 g/dL — ABNORMAL LOW (ref 30.0–36.0)
MCV: 95.9 fL (ref 80.0–100.0)
Monocytes Absolute: 0.9 10*3/uL (ref 0.1–1.0)
Monocytes Relative: 8 %
Neutro Abs: 8.6 10*3/uL — ABNORMAL HIGH (ref 1.7–7.7)
Neutrophils Relative %: 72 %
Platelets: 151 10*3/uL (ref 150–400)
RBC: 2.91 MIL/uL — ABNORMAL LOW (ref 3.87–5.11)
RDW: 21.3 % — ABNORMAL HIGH (ref 11.5–15.5)
WBC: 11.7 10*3/uL — ABNORMAL HIGH (ref 4.0–10.5)
nRBC: 0 % (ref 0.0–0.2)

## 2019-12-22 LAB — GLUCOSE, CAPILLARY
Glucose-Capillary: 159 mg/dL — ABNORMAL HIGH (ref 70–99)
Glucose-Capillary: 168 mg/dL — ABNORMAL HIGH (ref 70–99)
Glucose-Capillary: 170 mg/dL — ABNORMAL HIGH (ref 70–99)
Glucose-Capillary: 170 mg/dL — ABNORMAL HIGH (ref 70–99)
Glucose-Capillary: 228 mg/dL — ABNORMAL HIGH (ref 70–99)
Glucose-Capillary: 96 mg/dL (ref 70–99)

## 2019-12-22 MED ORDER — POTASSIUM CHLORIDE 20 MEQ/15ML (10%) PO SOLN: 40.0000 meq | Freq: Once | ORAL | Status: DC

## 2019-12-22 MED ORDER — SODIUM CHLORIDE 0.9 % IV SOLN
3.0000 g | Freq: Four times a day (QID) | INTRAVENOUS | Status: DC
  Administered 2019-12-22 – 2020-01-06 (×60): 3 g via INTRAVENOUS
  Filled 2019-12-22: qty 8
  Filled 2019-12-22: qty 3
  Filled 2019-12-22 (×3): qty 8
  Filled 2019-12-22 (×2): qty 3
  Filled 2019-12-22 (×2): qty 8
  Filled 2019-12-22 (×5): qty 3
  Filled 2019-12-22: qty 8
  Filled 2019-12-22 (×2): qty 3
  Filled 2019-12-22 (×3): qty 8
  Filled 2019-12-22: qty 3
  Filled 2019-12-22: qty 8
  Filled 2019-12-22 (×2): qty 3
  Filled 2019-12-22: qty 8
  Filled 2019-12-22: qty 3
  Filled 2019-12-22: qty 8
  Filled 2019-12-22: qty 3
  Filled 2019-12-22 (×2): qty 8
  Filled 2019-12-22 (×5): qty 3
  Filled 2019-12-22 (×3): qty 8
  Filled 2019-12-22: qty 3
  Filled 2019-12-22: qty 8
  Filled 2019-12-22 (×3): qty 3
  Filled 2019-12-22: qty 8
  Filled 2019-12-22: qty 3
  Filled 2019-12-22 (×2): qty 8
  Filled 2019-12-22: qty 3
  Filled 2019-12-22: qty 8
  Filled 2019-12-22: qty 3
  Filled 2019-12-22: qty 8
  Filled 2019-12-22: qty 3
  Filled 2019-12-22 (×2): qty 8
  Filled 2019-12-22: qty 3
  Filled 2019-12-22 (×3): qty 8
  Filled 2019-12-22 (×4): qty 3
  Filled 2019-12-22: qty 8

## 2019-12-22 MED ORDER — IPRATROPIUM-ALBUTEROL 0.5-2.5 (3) MG/3ML IN SOLN: 3.0000 mL | Freq: Four times a day (QID) | RESPIRATORY_TRACT | Status: DC | PRN

## 2019-12-22 MED ORDER — PANTOPRAZOLE SODIUM 40 MG PO PACK
40.0000 mg | PACK | Freq: Every day | ORAL | Status: DC
  Administered 2019-12-22: 40 mg
  Filled 2019-12-22: qty 20

## 2019-12-22 MED ORDER — SENNOSIDES-DOCUSATE SODIUM 8.6-50 MG PO TABS
1.0000 | ORAL_TABLET | Freq: Every day | ORAL | Status: DC
  Administered 2019-12-23 – 2020-01-05 (×9): 1 via ORAL
  Filled 2019-12-22 (×13): qty 1

## 2019-12-22 MED ORDER — POTASSIUM CHLORIDE 20 MEQ PO PACK
40.0000 meq | PACK | Freq: Once | ORAL | Status: AC
  Administered 2019-12-22: 40 meq via ORAL
  Filled 2019-12-22: qty 2

## 2019-12-22 MED ORDER — FENTANYL CITRATE (PF) 100 MCG/2ML IJ SOLN
25.0000 ug | INTRAMUSCULAR | Status: DC | PRN
  Administered 2019-12-22 – 2019-12-30 (×2): 50 ug via INTRAVENOUS
  Administered 2019-12-31: 25 ug via INTRAVENOUS
  Administered 2020-01-02: 50 ug via INTRAVENOUS
  Filled 2019-12-22 (×4): qty 2

## 2019-12-22 MED ORDER — INSULIN ASPART 100 UNIT/ML ~~LOC~~ SOLN
0.0000 [IU] | Freq: Three times a day (TID) | SUBCUTANEOUS | Status: DC
  Administered 2019-12-23: 7 [IU] via SUBCUTANEOUS
  Administered 2019-12-23: 3 [IU] via SUBCUTANEOUS
  Administered 2019-12-24: 7 [IU] via SUBCUTANEOUS
  Administered 2019-12-24: 4 [IU] via SUBCUTANEOUS
  Administered 2019-12-25: 11 [IU] via SUBCUTANEOUS
  Administered 2019-12-26: 4 [IU] via SUBCUTANEOUS
  Administered 2019-12-26: 3 [IU] via SUBCUTANEOUS
  Administered 2019-12-26: 7 [IU] via SUBCUTANEOUS
  Administered 2019-12-27 (×2): 3 [IU] via SUBCUTANEOUS
  Administered 2019-12-27 – 2019-12-29 (×4): 4 [IU] via SUBCUTANEOUS
  Administered 2019-12-29: 7 [IU] via SUBCUTANEOUS
  Administered 2019-12-30: 11 [IU] via SUBCUTANEOUS
  Administered 2019-12-30: 7 [IU] via SUBCUTANEOUS
  Administered 2019-12-30: 4 [IU] via SUBCUTANEOUS
  Administered 2019-12-31 (×2): 11 [IU] via SUBCUTANEOUS
  Administered 2019-12-31 – 2020-01-01 (×3): 4 [IU] via SUBCUTANEOUS
  Administered 2020-01-01: 11 [IU] via SUBCUTANEOUS
  Administered 2020-01-02: 4 [IU] via SUBCUTANEOUS
  Administered 2020-01-02 (×2): 7 [IU] via SUBCUTANEOUS
  Administered 2020-01-03: 4 [IU] via SUBCUTANEOUS
  Administered 2020-01-03: 3 [IU] via SUBCUTANEOUS
  Administered 2020-01-03: 11 [IU] via SUBCUTANEOUS
  Administered 2020-01-04: 3 [IU] via SUBCUTANEOUS
  Administered 2020-01-04: 4 [IU] via SUBCUTANEOUS
  Administered 2020-01-04 – 2020-01-05 (×2): 7 [IU] via SUBCUTANEOUS
  Administered 2020-01-05: 4 [IU] via SUBCUTANEOUS
  Administered 2020-01-05 – 2020-01-06 (×3): 11 [IU] via SUBCUTANEOUS

## 2019-12-22 MED ORDER — POTASSIUM CHLORIDE 20 MEQ/15ML (10%) PO SOLN
40.0000 meq | Freq: Once | ORAL | Status: AC
  Administered 2019-12-22: 40 meq
  Filled 2019-12-22: qty 30

## 2019-12-22 MED ORDER — APIXABAN 5 MG PO TABS
5.0000 mg | ORAL_TABLET | Freq: Two times a day (BID) | ORAL | Status: DC
  Administered 2019-12-22 – 2020-01-06 (×29): 5 mg via ORAL
  Filled 2019-12-22 (×30): qty 1

## 2019-12-22 MED ORDER — DIVALPROEX SODIUM 250 MG PO DR TAB
500.0000 mg | DELAYED_RELEASE_TABLET | Freq: Two times a day (BID) | ORAL | Status: DC
  Administered 2019-12-22 – 2020-01-06 (×29): 500 mg via ORAL
  Filled 2019-12-22 (×8): qty 2
  Filled 2019-12-22: qty 1
  Filled 2019-12-22 (×17): qty 2
  Filled 2019-12-22 (×3): qty 1
  Filled 2019-12-22 (×3): qty 2

## 2019-12-22 MED ORDER — ATORVASTATIN CALCIUM 10 MG PO TABS
10.0000 mg | ORAL_TABLET | Freq: Every day | ORAL | Status: DC
  Administered 2019-12-23 – 2020-01-06 (×14): 10 mg via ORAL
  Filled 2019-12-22 (×15): qty 1

## 2019-12-22 MED ORDER — CALCIUM CARBONATE ANTACID 1250 MG/5ML PO SUSP
500.0000 mg | Freq: Three times a day (TID) | ORAL | Status: AC
  Administered 2019-12-22: 500 mg via ORAL
  Filled 2019-12-22: qty 5

## 2019-12-22 MED ORDER — IPRATROPIUM-ALBUTEROL 0.5-2.5 (3) MG/3ML IN SOLN
3.0000 mL | Freq: Four times a day (QID) | RESPIRATORY_TRACT | Status: DC
  Administered 2019-12-22: 3 mL via RESPIRATORY_TRACT
  Filled 2019-12-22: qty 3

## 2019-12-22 NOTE — Progress Notes (Addendum)
[facility] PCCM Evening Rounds   Doing well post extubation Transfer to [facility] service, progressive care Will order echocardiogram given cardiac arrest  Heber [place], MD [place] PCCM Pager: [phone] Cell: [phone] If no response, call [phone]

## 2019-12-22 NOTE — Progress Notes (Signed)
eLink Physician-Brief Progress Note Patient Name: Becky Hendricks DOB: 1946-04-04 MRN: [id number]   Date of Service  12/22/2019  HPI/Events of Note  Nursing request to add CBC with platelets to AM labs.   eICU Interventions  Will order: 1. CBC with platelets at 5 AM.      Intervention Category Major Interventions: Other:  Lenell Antu 12/22/2019, 11:12 PM

## 2019-12-22 NOTE — Procedures (Signed)
Extubation Procedure Note  Patient Details:   Name: Starlina Lapre DOB: 12-09-1945 MRN: [id number]   Airway Documentation:    Vent end date: 12/22/19 Vent end time: 1030   Evaluation  O2 sats: stable throughout Complications: No apparent complications Patient did tolerate procedure well. Bilateral Breath Sounds: Clear, Diminished   Yes  Patient was extubated to 2 LPM nasal cannula. Patient a positive cuff leak prior to extubation. Strong productive cough once extubated. Able to speak well. No stridor or distress noted. BBS clear; diminished. Vitals stable. SPO2 98% and HR 62. RT will continue to monitor.    Belinda Bringhurst M 12/22/2019, 10:35 AM

## 2019-12-22 NOTE — Plan of Care (Signed)
  Problem: Clinical Measurements: Goal: Respiratory complications will improve Outcome: Progressing   Problem: Nutrition: Goal: Adequate nutrition will be maintained Outcome: Progressing Note: Pt is tolerating TF at goal well.   Problem: Elimination: Goal: Will not experience complications related to urinary retention Outcome: Progressing   Problem: Pain Managment: Goal: General experience of comfort will improve Outcome: Progressing   Problem: Respiratory: Goal: Ability to maintain a clear airway and adequate ventilation will improve Outcome: Progressing Note: Pt is weaning for 6-7 hours 5/10 and is currently weaning well today.   Problem: Activity: Goal: Risk for activity intolerance will decrease Outcome: Not Progressing Note: Pt on bedrest due to ventilator

## 2019-12-22 NOTE — Progress Notes (Signed)
NAME:  Becky Hendricks, MRN:  [id number], DOB:  Dec 24, 1945, LOS: 2 ADMISSION DATE:  12/20/2019, CONSULTATION DATE:  12/20/19 REFERRING MD:  Becky Rued MD, ED, CHIEF COMPLAINT:  Cardiopulmonary arrest  Brief History   Becky Hendricks is a 74 yo F with h/o AF on Eliquis, bipolar disorder, HTN, T2DM, chronic respiratory failure on 2L home o2 who presented after out of hospital cardiopulmonary arrest. Pt was recently discharged on 12/17/19 stage IV pressure ulcer to be on Unasyn + Vanc until 6/10. On 5/9, staff at the nursing home reported she didn't "look right" at lunch. She was later found in her room agonally breathing without a pulse. She underwent 1-2 minutes of CPR with ROSC. She was intubated in the field and brought to [facility] ED. In the ED, SpO2 100 intubated, BP 120s/90s on epi, blood glucose 300, lactate 6. WBC 11.3 from 10.8 on discharge. ABG was 7.42/64/272/42. Troponin 76 and 161 six hours later. Respiratory panel negative, EKG showed NSR without ischemic changes. She was minimally responsive despite no sedation; she was started on Levophed for BP support and transitioned to Meropenem and admitted to [facility].   Past Medical History  Chronic respiratory failure on 2 L nasal cannula Obesity  type 2 diabetes mellitus Hypertension baseline bipolar disease w/ some baseline confusion stage IV pressure ulcer involving the gluteal fold with bilateral ulcers on the right and left buttocks with osteomyelitis of the coccyx. Recently discharged 5/6 on Unasyn and vancomycin  Significant Hospital Events   5/9: Brief out of hospital cardiopulmonary arrest; ETT ; admitted   Consults:  PCCM  Procedures:  5/9: ETT in field   Significant Diagnostic Tests:  5/9: CT Head wo contrast: 1. No acute intracranial pathology. 2. Age-related atrophy and chronic microvascular ischemic changes.  5/9: CT Abd Pelvis w Contrast: 1. 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 2. Stable  decubitus ulcer with osteomyelitis involving distal sacrum and coccyx. 3. Cholelithiasis. No radiographic evidence of cholecystitis. 4. Stable small uterine fibroids. 5. Increased small right pleural effusion and bibasilar atelectasis.  5/9: CXR: 1. Left internal jugular catheter coiled over the left subclavian vein, with tip normally positioned over the superior vena cava. Please correlate with catheter function. 2. Increasing right basilar consolidation and/or effusion.  5/10 EEG: moderate to severe diffuse encephalopathy, nonspecific etiology. No seizures or definite epileptiform discharges were seen throughout the recording.  Micro Data:  BC x2 NGTD Urine culture NG  Respiratory panel: negative SARS-CoV-2/Influenza A/Influenza B  Antimicrobials:  4/22 - 4/28: Vanc + Cefepime + lagyl  4/28 - 5/9: Vanc + Unasyn (to be continued until 01/21/20) 5/9 - 5/10: Vanc + Meropenem   5/11 - present: Vanc + Unasyn   Interim history/subjective:  NAE. VSS. SpO2 100 on minimal 440/40%/+5. BP 90s/60s still on 6mcg/hr Levophed. Tube feeds initiated and pt tolerating well. Midline removed yesterday. Pt had Phos 5.3 and given Ca Carbonate 500mg  for 3 doses.   Objective   Blood pressure (!) 112/46, pulse (!) 49, temperature 98.6 F (37 C), temperature source Bladder, resp. rate 20, height 5\' 4"  (1.626 m), weight 98.4 kg, SpO2 100 %.    Vent Mode: PSV;CPAP FiO2 (%):  [30 %-40 %] 30 % Set Rate:  [12 bmp-15 bmp] 12 bmp Vt Set:  [440 mL] 440 mL PEEP:  [5 cmH20] 5 cmH20 Pressure Support:  [8 cmH20-12 cmH20] 8 cmH20 Plateau Pressure:  [14 cmH20-15 cmH20] 14 cmH20   Intake/Output Summary (Last 24 hours) at  12/22/2019 0907 Last data filed at 12/22/2019 0800 Gross per 24 hour  Intake 4621.83 ml  Output 905 ml  Net 3716.83 ml   Filed Weights   12/21/19 0500 12/22/19 0500  Weight: 94.7 kg 98.4 kg    Examination: Gen: Elderly woman in bed, ill appearing. Alert, responds to questions with nods and  hand motions appropriately.  HEENT: atraumatic, normocephalic, sclera anicteric, EOMI, PERRL  Neck: no cervical lymphadenopathy or thyromegaly, no JVD Heart: RRR, S1, S2, no M/R/G, no chest wall tenderness Lungs: CTAB, no crackles or wheezes, mechanical breath sounds Abdomen: Normoactive bowel sounds, soft, NTND, no rebound/guarding Extremities: no clubbing, cyanosis, or edema: pulses are +2 in bilateral upper and lower extremities Neuro: Opens eyes to voice, follows commands. CN II-XI grossly intact.  Skin:  No rashes, lesions on clothed exam   Resolved Hospital Problem list   none  Assessment & Plan:  74 yo F with h/o AF on Eliquis, chronic respiratory failure on 2L home O2, and stage IV decubitus ulcer presented to [facility] after out of hospital cardiopulmonary arrest.   Cardiopulmonary Arrest: Pt had cardiopulmonary arrest on 5/9 at her nursing home requiring 1-2 minutes of CPR before ROSC. At this point, most likely d/t sepsis/shock. Pt has had worrisome hypotension on pressors in the setting of osteomyelitis and stage IV polymicrobial ulcer. Also considered PE, however less likely given she is compliant with Eliquis. Primary pulmonary cause would be unlikely; CXR did show some bibasilar infiltrate but repeat shows R pleural effusion not c/f new PNA. Primary CV event also unlikely given normal echo on 4/27 (LVEF 60-65%, no structural heart disease) and only mildly elevated troponin c/w demand ischemia. Pt continues to do well with SBT and is ready for extubation today.  - Extubation today  - Bedside swallow eval s/p extubation   - Continue resp support prn  - Tele   Sepsis/Shock: Most likely 2/2 osteomyelitis of sacrum/coccyx, though alternate soures include polymicrobial decubitus ulcer and strep viridans bacteremia found last week. Pt is being adequately treated for these on Unasyn and Vanc, however, so the reason for her decompensation is unclear.  CT abd pelvis showed significant decrease  in size of left buttock fistula and abscess since previous study; stable decubitus ulcer with osteomyelitis involving distal sacrum and coccyx. CXR showed some bibasilar infiltrate, repeat CXR showed R pleural effusion; at this point, I'm not concerned for new PNA as a cause. Pt had midline from prior hospitalization which could also be a cause but BC remain NGTD; this was removed on 5/10. She is s/p 1 L NS bolus and mIVF LR; Levophed has been downtitrated from 10 to 2-3 (was off briefly with some hypotension). Lactate downtrending. Overall, she is improving.  - BC x2 NGTD  - Continue titrating Levophed for MAP >65  - Transition Meropenem to Unasyn ; continue Vanc + Unasyn until 01/21/20  - Will need new line prior to dc   Acute Metabolic Encephalopathy: CT head without acute changes. Likely related to her cardiopulmonary arrest. EEG on 5/10 showed moderate to severe diffuse encephalopathy, nonspecific etiology. No seizures or definite epileptiform discharges were seen throughout the recording.  She is now A&O and mentating well.  - NTD  Pressure Ulcer  Osteomyelitis: Pt has stage IV pressure ulcer recently discharged on 12/17/19 on Unasyn and Vanc until 01/21/20, as above.  - Wound care consulted, appreciate recs  - Antibx as above   Atrial Fibrillation: Pt remains in NSR.  - Continue home Eliquis  Bipolar Disorder:  - Continue home Depakene   T2DM: BG well controlled 110s-160s yesterday.  -SSI  - Continue home atorvastatin   FEN: Will aggressively replete lytes given sepsis.  Phos 5.3 yesterday, will give Ca Carbonate as binder - Ca carbonate 500 mg for 3 doses for elevated phos  - Replete K, Phos, Mg, Ca as needed - LR mIVF - Stop tube feeds after extubation ; swallow study then reg diet  - Senna, miralax    Best practice:  Diet: Reg diet  Pain/Anxiety/Delirium protocol (if indicated): fentanyl prn; none after extubation VAP protocol (if indicated): per protocol DVT prophylaxis:  Eliquis  GI prophylaxis: Protonix Glucose control: SSI Mobility: OOB Code Status: FULL Family Communication: Spoke with brother 5/11 on rounds Disposition: ICU     [place], MS4

## 2019-12-22 NOTE — Progress Notes (Signed)
Electrolyte replacement /K+ replaced as ordered via NG with pt on vent with Elink protocol

## 2019-12-22 NOTE — Progress Notes (Signed)
NAME:  Becky Hendricks, MRN:  [id number], DOB:  08/29/1945, LOS: 2 ADMISSION DATE:  12/20/2019, CONSULTATION DATE:  5/9 REFERRING MD:  Maryan Rued, CHIEF COMPLAINT:  Cardiac arrest   Brief History   74 y/o female with multiple medical problems brought from a nursing home with a cardiac arrest for 2 minutes in setting of septic shock.   Past Medical History  Chronic respiratory failure on 2 L nasal cannula Obesity  type 2 diabetes mellitus Hypertension baseline bipolar disease w/ some baseline confusion stage IV pressure ulcer involving the gluteal fold with bilateral ulcers on the right and left buttocks with osteomyelitis of the coccyx.Recently discharged 5/6 on Unasyn and vancomycin  Significant Hospital Events   5/9 out of hospital cardiac arrest  Consults:    Procedures:  5/9 ETT >  5/9 CVL L IJ>   Significant Diagnostic Tests:  5/9 Head CT > NAICP, age related chronic microvascular ischemic changes 5/9 CT ab/pelvis w contrast> decrease of L buttock abscess, stable ulcer and osteo involving distal sacrum and coccyx, cholelithiasis, no cholecystitis, stable uterine fibroids, incrased small R pleural effusion 5/9 L IJ catheter coiled over left subclavian vein.   Micro Data:  5/9 blood >  5/9 urine >  5/9 SARS CoV 2/Flu > neg  Antimicrobials:  5/9 mero >  5/10 vanc >  Interim history/subjective:  SBT yesterday> low volumes Remains on low dose levophed now Remains on tube feeding  Objective   Blood pressure 92/63, pulse 66, temperature (!) 97.5 F (36.4 C), resp. rate 13, height 5\' 4"  (1.626 m), weight 98.4 kg, SpO2 99 %.    Vent Mode: PRVC FiO2 (%):  [30 %-40 %] 30 % Set Rate:  [12 bmp-15 bmp] 12 bmp Vt Set:  [440 mL] 440 mL PEEP:  [5 cmH20] 5 cmH20 Pressure Support:  [12 cmH20] 12 cmH20 Plateau Pressure:  [14 cmH20-15 cmH20] 14 cmH20   Intake/Output Summary (Last 24 hours) at 12/22/2019 0734 Last data filed at 12/22/2019 0600 Gross per 24 hour  Intake 4605.83 ml   Output 815 ml  Net 3790.83 ml   Filed Weights   12/21/19 0500 12/22/19 0500  Weight: 94.7 kg 98.4 kg    Examination:  General:  In bed on vent HENT: NCAT ETT in place PULM: CTA B, vent supported breathing CV: RRR, no mgr GI: BS+, soft, nontender MSK: normal bulk and tone Neuro: awake, following commands    Resolved Hospital Problem list     Assessment & Plan:  Acute hypoxemic respiratory failure> passing SBT Extubate today SLP evaluation after extubation  Distributive shock/septic shock > unclear source, ulcer/osteo? S/P PEA Arrest Change antibiotics back ton ampsulbactam/vanc per home regimen  Stage IV decubitus ulcer with abscess Wound care  Bipolar disorder Resume home depakote  DM2 SSI/lantus   Atrial fib Tele eliquis  HTN>  continue to hold home antihypertensives  Best practice:  Diet: advance today Pain/Anxiety/Delirium protocol (if indicated): d/c today VAP protocol (if indicated): yes DVT prophylaxis: eliquis GI prophylaxis: n/a Glucose control: SSI Mobility: out of bed tdoay Code Status: full Family Communication: update brother bedside Disposition: remain in ICU  Labs   CBC: Recent Labs  Lab 12/16/19 0449 12/16/19 0449 12/20/19 1543 12/20/19 1606 12/20/19 2131 12/21/19 0059 12/21/19 0409 12/21/19 0416 12/21/19 1718  WBC 10.8*  --  11.3*  --   --   --  14.2*  --   --   NEUTROABS  --   --  8.4*  --   --   --   --   --   --  HGB 8.6*   < > 10.2*   < > 10.9* 10.5* 9.2* 9.9* 9.5*  HCT 29.5*   < > 36.0   < > 32.0* 31.0* 31.0* 29.0* 28.0*  MCV 97.0  --  98.1  --   --   --  95.1  --   --   PLT 266  --  244  --   --   --  207  --   --    < > = values in this interval not displayed.    Basic Metabolic Panel: Recent Labs  Lab 12/20/19 1543 12/20/19 1606 12/20/19 1920 12/20/19 2131 12/21/19 0409 12/21/19 0416 12/21/19 1718 12/21/19 1931 12/22/19 0329  NA 138   < > 138   < > 140 137 135 138 138  K 3.7   < > 2.6*   < > 3.9  3.8 3.7 3.6 3.6  CL 84*  --  87*  --  93*  --   --  91* 94*  CO2 38*  --  34*  --  36*  --   --  33* 34*  GLUCOSE 313*  --  194*  --  99  --   --  191* 202*  BUN 22  --  23  --  21  --   --  20 18  CREATININE 0.74  --  0.61  --  0.53  --   --  0.43* 0.35*  CALCIUM 7.9*  --  7.7*  --  8.1*  --   --  8.0* 7.8*  MG  --   --  1.5*  --  2.4  --   --   --   --   PHOS  --   --   --   --  1.6*  --   --  5.3*  --    < > = values in this interval not displayed.   GFR: Estimated Creatinine Clearance: 72.5 mL/min (A) (by C-G formula based on SCr of 0.35 mg/dL (L)). Recent Labs  Lab 12/16/19 0449 12/20/19 1543 12/20/19 1920 12/20/19 2120 12/21/19 0409  PROCALCITON  --   --  <0.10  --   --   WBC 10.8* 11.3*  --   --  14.2*  LATICACIDVEN  --  6.0* 3.9* 3.2*  --     Liver Function Tests: Recent Labs  Lab 12/20/19 1543  AST 34  ALT 19  ALKPHOS 77  BILITOT 0.7  PROT 5.2*  ALBUMIN 1.9*   No results for input(s): LIPASE, AMYLASE in the last 168 hours. No results for input(s): AMMONIA in the last 168 hours.  ABG    Component Value Date/Time   PHART 7.563 (H) 12/21/2019 1718   PCO2ART 45.8 12/21/2019 1718   PO2ART 119 (H) 12/21/2019 1718   HCO3 41.2 (H) 12/21/2019 1718   TCO2 43 (H) 12/21/2019 1718   O2SAT 99.0 12/21/2019 1718     Coagulation Profile: Recent Labs  Lab 12/20/19 1543  INR 1.3*    Cardiac Enzymes: No results for input(s): CKTOTAL, CKMB, CKMBINDEX, TROPONINI in the last 168 hours.  HbA1C: Hgb A1c MFr Bld  Date/Time Value Ref Range Status  12/03/2019 11:20 PM 7.8 (H) 4.8 - 5.6 % Final    Comment:    (NOTE) Pre diabetes:          5.7%-6.4% Diabetes:              >6.4% Glycemic control for   <7.0% adults with diabetes  09/07/2019 07:50 PM 9.5 (H) 4.8 - 5.6 % Final    Comment:    (NOTE) Pre diabetes:          5.7%-6.4% Diabetes:              >6.4% Glycemic control for   <7.0% adults with diabetes     CBG: Recent Labs  Lab 12/21/19 1549  12/21/19 1912 12/21/19 2303 12/22/19 0308 12/22/19 0701  GLUCAP 126* 157* 204* 168* 228*     Critical care time: 35 minutes     Heber [facility], MD [facility] Pager: [phone] Cell: [phone] If no response, call [phone]

## 2019-12-23 ENCOUNTER — Inpatient Hospital Stay (HOSPITAL_COMMUNITY): Payer: Medicare Other

## 2019-12-23 DIAGNOSIS — L89304 Pressure ulcer of unspecified buttock, stage 4: Secondary | ICD-10-CM

## 2019-12-23 DIAGNOSIS — M869 Osteomyelitis, unspecified: Secondary | ICD-10-CM

## 2019-12-23 DIAGNOSIS — I361 Nonrheumatic tricuspid (valve) insufficiency: Secondary | ICD-10-CM

## 2019-12-23 DIAGNOSIS — J9600 Acute respiratory failure, unspecified whether with hypoxia or hypercapnia: Secondary | ICD-10-CM

## 2019-12-23 LAB — CBC WITH DIFFERENTIAL/PLATELET
Abs Immature Granulocytes: 0.05 10*3/uL (ref 0.00–0.07)
Basophils Absolute: 0 10*3/uL (ref 0.0–0.1)
Basophils Relative: 0 %
Eosinophils Absolute: 0.3 10*3/uL (ref 0.0–0.5)
Eosinophils Relative: 3 %
HCT: 29 % — ABNORMAL LOW (ref 36.0–46.0)
Hemoglobin: 8.5 g/dL — ABNORMAL LOW (ref 12.0–15.0)
Immature Granulocytes: 1 %
Lymphocytes Relative: 29 %
Lymphs Abs: 2.5 10*3/uL (ref 0.7–4.0)
MCH: 28.6 pg (ref 26.0–34.0)
MCHC: 29.3 g/dL — ABNORMAL LOW (ref 30.0–36.0)
MCV: 97.6 fL (ref 80.0–100.0)
Monocytes Absolute: 0.9 10*3/uL (ref 0.1–1.0)
Monocytes Relative: 11 %
Neutro Abs: 4.8 10*3/uL (ref 1.7–7.7)
Neutrophils Relative %: 56 %
Platelets: 128 10*3/uL — ABNORMAL LOW (ref 150–400)
RBC: 2.97 MIL/uL — ABNORMAL LOW (ref 3.87–5.11)
RDW: 21.4 % — ABNORMAL HIGH (ref 11.5–15.5)
WBC: 8.6 10*3/uL (ref 4.0–10.5)
nRBC: 0 % (ref 0.0–0.2)

## 2019-12-23 LAB — VITAMIN B12: Vitamin B-12: 363 pg/mL (ref 180–914)

## 2019-12-23 LAB — COMPREHENSIVE METABOLIC PANEL
ALT: 18 U/L (ref 0–44)
AST: 13 U/L — ABNORMAL LOW (ref 15–41)
Albumin: 1.7 g/dL — ABNORMAL LOW (ref 3.5–5.0)
Alkaline Phosphatase: 58 U/L (ref 38–126)
Anion gap: 6 (ref 5–15)
BUN: 9 mg/dL (ref 8–23)
CO2: 36 mmol/L — ABNORMAL HIGH (ref 22–32)
Calcium: 7.8 mg/dL — ABNORMAL LOW (ref 8.9–10.3)
Chloride: 95 mmol/L — ABNORMAL LOW (ref 98–111)
Creatinine, Ser: 0.39 mg/dL — ABNORMAL LOW (ref 0.44–1.00)
GFR calc Af Amer: >60 mL/min (ref >60)
GFR calc non Af Amer: >60 mL/min (ref >60)
Glucose, Bld: 141 mg/dL — ABNORMAL HIGH (ref 70–99)
Potassium: 4.6 mmol/L (ref 3.5–5.1)
Sodium: 137 mmol/L (ref 135–145)
Total Bilirubin: 0.7 mg/dL (ref 0.3–1.2)
Total Protein: 4.7 g/dL — ABNORMAL LOW (ref 6.5–8.1)

## 2019-12-23 LAB — GLUCOSE, CAPILLARY
Glucose-Capillary: 123 mg/dL — ABNORMAL HIGH (ref 70–99)
Glucose-Capillary: 122 mg/dL — ABNORMAL HIGH (ref 70–99)
Glucose-Capillary: 208 mg/dL — ABNORMAL HIGH (ref 70–99)
Glucose-Capillary: 126 mg/dL — ABNORMAL HIGH (ref 70–99)
Glucose-Capillary: 158 mg/dL — ABNORMAL HIGH (ref 70–99)

## 2019-12-23 LAB — HIV ANTIBODY (ROUTINE TESTING W REFLEX): HIV Screen 4th Generation wRfx: NONREACTIVE

## 2019-12-23 LAB — AMMONIA: Ammonia: 12 umol/L (ref 9–35)

## 2019-12-23 LAB — ECHOCARDIOGRAM COMPLETE
Height: 64 in
Weight: 3537.94 oz

## 2019-12-23 LAB — TSH: TSH: 2.57 u[IU]/mL (ref 0.350–4.500)

## 2019-12-23 MED ORDER — ORAL CARE MOUTH RINSE
15.0000 mL | Freq: Two times a day (BID) | OROMUCOSAL | Status: DC
  Administered 2019-12-23 – 2020-01-05 (×16): 15 mL via OROMUCOSAL

## 2019-12-23 NOTE — Progress Notes (Signed)
Pt has progressive bed on 3E. Report given to Ava, receiving RN. Pt to be transported by bed.  Spoke to pts brother [name] to relay new room number.  [name] appreciative.

## 2019-12-23 NOTE — Evaluation (Signed)
Physical Therapy Evaluation Patient Details Name: Becky Hendricks MRN: [id number] DOB: [birth year] Today's Date: 12/23/2019   History of Present Illness  75 Y/o with PMH of afib, bipolar, HTN, Obesity, DM, Pressure ulcer with recent admission for left buttock abscess s.p drainage brought to ED after arrest requiring brief CPR at nursing home.  pt was intubated in the field.  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  Pt extubated 5/11.  Clinical Impression  Pt admitted with/for septic shock, having require brief CPR.  Pt requiring Total A of 2 persons for bed mobility and significant balance assist at EOB.  Pt currently limited functionally due to the problems listed. ( See problems list.)   Pt will benefit from PT to maximize function and safety in order to get ready for next venue listed below.     Follow Up Recommendations SNF    Equipment Recommendations  None recommended by PT    Recommendations for Other Services       Precautions / Restrictions Precautions Precautions: Fall Precaution Comments: sacral wound, stage IV with osteo Restrictions Weight Bearing Restrictions: No      Mobility  Bed Mobility Overal bed mobility: Needs Assistance Bed Mobility: Sit to Supine;Supine to Sit     Supine to sit: Total assist;+2 for physical assistance Sit to supine: Total assist;+2 for physical assistance   General bed mobility comments: pt did not initiate movement to command or assist therapist once assist give to patient.  Attempted to get pt to push with L UE with hand over hand assist without success.  Use of pad to help scoot to EOB  Transfers                 General transfer comment: not attempted; Will need [place]ove  Ambulation/Gait             General Gait Details: unable this day  Stairs            Wheelchair Mobility    Modified Rankin (Stroke Patients Only)        Balance Overall balance assessment: Needs assistance   Sitting balance-Leahy Scale: Zero Sitting balance - Comments: heavy list posteriorly with max to total assist                                     Pertinent Vitals/Pain Pain Assessment: Faces Faces Pain Scale: Hurts a little bit Pain Location: generalized discomfort Pain Descriptors / Indicators: Grimacing Pain Intervention(s): Limited activity within patient's tolerance    Home Living Family/patient expects to be discharged to:: Skilled nursing facility                 Additional Comments: Per chart, pt admitted to hospital December 2020 and discharged to a SNF. Prior to December 2020, pt was living at home independently.   pt reports her living situation as living alone in a 1-level condo with shower and reg-height commode, driving etc.    Prior Function Level of Independence: Needs assistance   Gait / Transfers Assistance Needed: Per chart pt lived alone in december 2020 pre-covid, pt reported "I haven't walked in a long time."  ADL's / Homemaking Assistance Needed: Pt receiveing assistance for ADL at SNF; Need clarification        Hand Dominance   Dominant Hand: Right    Extremity/Trunk Assessment  Upper Extremity Assessment Upper Extremity Assessment: RUE deficits/detail;LUE deficits/detail RUE Deficits / Details: edematous hand; Pt with contractures in IP joints with limitations of @ 30 degrees - lacking extensionnot moving RUE as well as L; Pt states she is R hand dominant; Unable to complete hand to mouth pattern due to weakness; isolated movemetn presetn but weak; Unable to maintain grasp on object with R hand. Also affected by inattention RUE Coordination: decreased fine motor;decreased gross motor LUE Deficits / Details: generalized weakness    Lower Extremity Assessment Lower Extremity Assessment: RLE deficits/detail;LLE deficits/detail RLE Deficits / Details: pt showed no  initiation of LE movement to command and trace/minimal movement once assisted.  Difficult to discern awareness of light touch. RLE Coordination: decreased fine motor;decreased gross motor LLE Deficits / Details: Pt showed no initiation of L LE movement to command, nor did she assist flexion.  She showed trace to minimal resistance in gross extention. LLE Coordination: decreased fine motor;decreased gross motor    Cervical / Trunk Assessment Cervical / Trunk Assessment: Other exceptions(posterior bias)  Communication   Communication: No difficulties  Cognition Arousal/Alertness: Awake/alert Behavior During Therapy: Flat affect Overall Cognitive Status: No family/caregiver present to determine baseline cognitive functioning Area of Impairment: Orientation;Attention;Memory;Following commands;Safety/judgement;Awareness;Problem solving                 Orientation Level: Disoriented to;Place;Time;Situation Current Attention Level: Sustained Memory: Decreased recall of precautions;Decreased short-term memory Following Commands: Follows one step commands inconsistently;Follows one step commands with increased time Safety/Judgement: Decreased awareness of safety;Decreased awareness of deficits Awareness: Intellectual Problem Solving: Slow processing;Decreased initiation;Difficulty sequencing;Requires verbal cues;Requires tactile cues        General Comments General comments (skin integrity, edema, etc.): Pt from [place]; 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    Actuary Exercises - Upper Extremity Shoulder Flexion: AAROM;AROM;Both;10 reps;Supine Elbow Flexion: AROM;AAROM;Both;10 reps;Supine Elbow Extension: AROM;AAROM;Both;10 reps;Supine Digit Composite Flexion: AROM;AAROM;Both;10 reps;Seated Composite Extension: AROM;AAROM;Both;10 reps;Seated Other Exercises Other Exercises: warm up ROM to Bil LE in addition to MMT>   Assessment/Plan    PT Assessment Patient needs continued PT  services  PT Problem List Decreased strength;Decreased activity tolerance;Decreased balance;Decreased mobility;Decreased coordination       PT Treatment Interventions DME instruction;Gait training;Functional mobility training;Therapeutic activities;Therapeutic exercise;Balance training;Patient/family education    PT Goals (Current goals can be found in the Care Plan section)  Acute Rehab PT Goals Patient Stated Goal: to not be sick anymore PT Goal Formulation: With patient Time For Goal Achievement: 01/06/20 Potential to Achieve Goals: Fair    Frequency Min 2X/week   Barriers to discharge        Co-evaluation   Reason for Co-Treatment: Complexity of the patient's impairments (multi-system involvement);For patient/therapist safety;To address functional/ADL transfers   OT goals addressed during session: ADL's and self-care;Strengthening/ROM       AM-PAC PT "6 Clicks" Mobility  Outcome Measure Help needed turning from your back to your side while in a flat bed without using bedrails?: Total Help needed moving from lying on your back to sitting on the side of a flat bed without using bedrails?: Total Help needed moving to and from a bed to a chair (including a wheelchair)?: Total Help needed standing up from a chair using your arms (e.g., wheelchair or bedside chair)?: Total Help needed to walk in hospital room?: Total Help needed climbing 3-5 steps with a railing? : Total 6 Click Score: 6    End of Session   Activity Tolerance: Other (comment);Patient limited by fatigue(decreased focus) Patient left:  in bed;with call bell/phone within reach;with bed alarm set;with SCD's reapplied Nurse Communication: Mobility status PT Visit Diagnosis: Other abnormalities of gait and mobility (R26.89);Muscle weakness (generalized) (M62.81);Difficulty in walking, not elsewhere classified (R26.2);Other symptoms and signs involving the nervous system (R29.898)    Time: 1610-9604 PT Time  Calculation (min) (ACUTE ONLY): 29 min   Charges:   PT Evaluation $PT Eval Moderate Complexity: 1 Mod          12/23/2019  Jacinto Halim., PT Acute Rehabilitation Services [phone]  (pager) [phone]  (office)  Eliseo Gum Keyasha Miah 12/23/2019, 5:40 PM

## 2019-12-23 NOTE — Progress Notes (Signed)
PROGRESS NOTE    Becky Hendricks  TSV:[id number] DOB: 01-16-1946 DOA: 12/20/2019 PCP: Roderic Scarce, MD   Chief Complaint  Patient presents with  . post cpr    Brief Narrative:  Ms. Theriault is a 74 yo F with h/o AF on Eliquis, bipolar disorder, HTN, T2DM, who presented after out of hospital cardiopulmonary arrest. Pt was recently discharged on 12/17/19 stage IV pressure ulcer to be on Unasyn + Vanc until 6/10. On 5/9, staff at the nursing home reported she didn't "look right" at lunch. She was later found in her room agonally breathing without a pulse. She underwent 1-2 minutes of CPR with ROSC. She was intubated in the field and brought to [facility] ED. In the ED, SpO2 100 intubated, BP 120s/90s on epi, blood glucose 300, lactate 6. WBC 11.3 from 10.8 on discharge. ABG was 7.42/64/272/42. Troponin 76 and 161 six hours later. Respiratory panel negative, EKG showed NSR without ischemic changes. She was minimally responsive despite no sedation; she was started on Levophed for BP support and transitioned to Meropenem and admitted to [facility].  Patient subsequently weaned off pressors.  Patient extubated.  Patient on IV antibiotics.  2D echo pending.  Patient transferred to hospitalist team on 12/23/2019.   Assessment & Plan:   Active Problems:   Pressure injury of skin   Respiratory failure with hypoxia (HCC)   Acute metabolic encephalopathy   Severe sepsis (HCC)   Osteomyelitis (HCC)   Abscess of multiple sites of buttock   AF (paroxysmal atrial fibrillation) (HCC)   Cardiac arrest (HCC)   Hyperphosphatemia  1 cardiopulmonary arrest Patient noted to have had a cardiopulmonary arrest on 12/20/2019 in the nursing home requiring 1 to 2 minutes of CPR before ROSC.  Working diagnosis felt likely secondary to septic shock secondary to stage IV decubitus ulcer with osteomyelitis of the coccyx/sacrum.  Patient noted on admission to be hypotensive requiring pressors in the setting of osteomyelitis.  Pressors have  been weaned off.  It was felt unlikely secondary to PE as patient noted to be compliant with Eliquis.  Chest x-ray done did show some bibasilar infiltrate but repeat showed right pleural effusion not consistent with pneumonia.  Patient with recent 2D echo on 12/08/2019 with a EF of 60 to 65%, no wall motion abnormalities.  Patient with minimally elevated troponin felt consistent with demand ischemia.  2D echo pending.  Currently on 2 L nasal cannula.  Clinically improving.  Continue empiric IV antibiotics.  Follow.  2.  Severe sepsis with septic shock secondary to osteomyelitis of the sacrum/coccyx, stage IV pressure ulcer, POA Secondary to osteomyelitis of the sacrum/coccyx.  Patient noted to have a polymicrobial decubitus ulcer with strep viridans bacteremia noted last week.  Patient was treated on IV Unasyn and vancomycin which was discharged on.  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  Stable decubitus ulcer with osteomyelitis involving distal sacrum and coccyx.  Chest x-ray with some bibasilar infiltrate however repeat chest x-ray showed a right pleural effusion.  Sepsis felt not likely due to a pulmonary infiltrate.  Patient pancultured, blood cultures with no growth to date.  Patient noted to have a midline prior to admission which was removed on 12/21/2019.  Lactate trending down.  Patient currently off pressors as of 12/22/2019.  Improving clinically.  Continue empiric IV vancomycin IV Unasyn through 01/21/2020 as previously recommended.  Will need new PICC line versus midline prior to  discharge once blood cultures have been finalized.  Follow.  3.  Acute metabolic encephalopathy Questionable etiology.  Felt could be secondary to patient's acute cardiopulmonary arrest.  CT head done negative for any acute abnormalities.  EEG done on 12/21/2019 with moderate to severe diffuse encephalopathy, nonspecific etiology, no  seizures or epileptiform discharges noted.  Patient alert to self.  Patient thinks she is in the nursing home.  Patient thinks is in the 24s.  Patient thinks it is January.  Patient however answering questions appropriately.  It was noted that patient did have encephalopathy versus chronic cognitive deficit on prior hospitalization (4/22-5/6) and may be patient's new baseline.  We will check a TSH, vitamin B12, HIV, RPR.  Supportive care.  4.  Stage IV decubitus pressure ulcer/osteomyelitis of the sacrum and coccyx, POA Patient discharged on 12/17/2019 on Unasyn and vancomycin through 01/21/2020.  Wound care consulted.  Continue empiric IV antibiotics.  Follow.  5.  Paroxysmal atrial fibrillation Currently rate controlled.  Eliquis for anticoagulation.  6.  Bipolar disorder Depakote.  7.  Diabetes mellitus type 2 Hemoglobin A1c 7.8 12/03/2019.  CBG of 122 this morning.  Patient seen by speech therapist and currently on a regular diet.  Continue sliding scale insulin.  8.  Hyperphosphatemia Patient was started on calcium binders.  Repeat phosphorus levels tomorrow.    DVT prophylaxis: Eliquis Code Status: Full Family Communication: Updated patient.  No family at bedside. Disposition:   Status is: Inpatient    Dispo: The patient is from: SNF              Anticipated d/c is to: Likely SNF.  To be determined.              Anticipated d/c date is: To be determined.              Patient currently in the stepdown unit, recently of pressors, on empiric IV antibiotics, PT OT SLP evaluation pending.  Patient not ready for discharge.        Consultants:   Admitted to PCCM    Procedures:  5/9: CT Head wo contrast: 1. No acute intracranial pathology. 2. Age-related atrophy and chronic microvascular ischemic changes.  5/9: CT Abd Pelvis w Contrast: 1. 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 2. Stable decubitus ulcer with osteomyelitis  involving distal sacrum and coccyx. 3. Cholelithiasis. No radiographic evidence of cholecystitis. 4. Stable small uterine fibroids. 5. Increased small right pleural effusion and bibasilar atelectasis.  5/9: CXR: 1. Left internal jugular catheter coiled over the left subclavian vein, with tip normally positioned over the superior vena cava. Please correlate with catheter function. 2. Increasing right basilar consolidation and/or effusion.  5/10 EEG: moderate to severe diffuse encephalopathy, nonspecific etiology.No seizures ordefiniteepileptiform discharges were seen throughout the recording.  2D echo pending 12/23/2019  12/20/2019 ETT>>>> 12/22/2019    Antimicrobials:  4/22 - 4/28: Vanc + Cefepime + lagyl  4/28 - 5/9: Vanc + Unasyn (to be continued until 01/21/20) 5/9 - 5/10: Vanc + Meropenem            5/11 - present: Vanc + Unasyn   Micro Data:  BC x2 NGTD Urine culture NG  Respiratory panel: negative SARS-CoV-2/Influenza A/Influenza B    Subjective: .  Patient laying in bed.  Alert to self only.  Answering questions appropriately.  Denies any chest pain.  No shortness of breath.  Appreciative of her care.  Objective: Vitals:   12/23/19 1101  12/23/19 1200 12/23/19 1310 12/23/19 1400  BP:  124/67 129/81 115/61  Pulse:  (!) 57 65 63  Resp:  13 (!) 21 (!) 21  Temp: 98.6 F (37 C)     TempSrc: Oral     SpO2:  100% 99% 100%  Weight:      Height:        Intake/Output Summary (Last 24 hours) at 12/23/2019 1517 Last data filed at 12/23/2019 1400 Gross per 24 hour  Intake 1090.24 ml  Output 1100 ml  Net -9.76 ml   Filed Weights   12/21/19 0500 12/22/19 0500 12/23/19 0341  Weight: 94.7 kg 98.4 kg 100.3 kg    Examination:  General exam: Appears calm and comfortable  Respiratory system: Bibasilar crackles. Respiratory effort normal. Cardiovascular system: S1 & S2 heard, RRR. No JVD, murmurs, rubs, gallops or clicks. No pedal edema. Gastrointestinal system:  Abdomen is nondistended, soft and nontender. No organomegaly or masses felt. Normal bowel sounds heard. Central nervous system: Alert and oriented to self only.  Some slight right upper extremity weakness.  Extremities: Bilateral upper extremity swelling. Skin: No rashes, lesions or ulcers Psychiatry: Judgement and insight appear poor to fair. Mood & affect appropriate.     Data Reviewed: I have personally reviewed following labs and imaging studies  CBC: Recent Labs  Lab 12/20/19 1543 12/20/19 1606 12/21/19 0409 12/21/19 0409 12/21/19 0416 12/21/19 1718 12/22/19 0820 12/22/19 1256 12/23/19 0316  WBC 11.3*  --  14.2*  --   --   --   --  11.7* 8.6  NEUTROABS 8.4*  --   --   --   --   --   --  8.6* 4.8  HGB 10.2*   < > 9.2*   < > 9.9* 9.5* 9.2* 8.3* 8.5*  HCT 36.0   < > 31.0*   < > 29.0* 28.0* 27.0* 27.9* 29.0*  MCV 98.1  --  95.1  --   --   --   --  95.9 97.6  PLT 244  --  207  --   --   --   --  151 128*   < > = values in this interval not displayed.    Basic Metabolic Panel: Recent Labs  Lab 12/20/19 1920 12/20/19 2131 12/21/19 0409 12/21/19 0416 12/21/19 1931 12/22/19 0329 12/22/19 0820 12/22/19 1809 12/23/19 0316  NA 138   < > 140   < > 138 138 136 139 137  K 2.6*   < > 3.9   < > 3.6 3.6 4.0 5.0 4.6  CL 87*   < > 93*  --  91* 94*  --  95* 95*  CO2 34*   < > 36*  --  33* 34*  --  33* 36*  GLUCOSE 194*   < > 99  --  191* 202*  --  150* 141*  BUN 23   < > 21  --  20 18  --  11 9  CREATININE 0.61   < > 0.53  --  0.43* 0.35*  --  0.36* 0.39*  CALCIUM 7.7*   < > 8.1*  --  8.0* 7.8*  --  8.0* 7.8*  MG 1.5*  --  2.4  --   --   --   --   --   --   PHOS  --   --  1.6*  --  5.3*  --   --   --   --    < > =  values in this interval not displayed.    GFR: Estimated Creatinine Clearance: 73.2 mL/min (A) (by C-G formula based on SCr of 0.39 mg/dL (L)).  Liver Function Tests: Recent Labs  Lab 12/20/19 1543 12/23/19 0316  AST 34 13*  ALT 19 18  ALKPHOS 77 58    BILITOT 0.7 0.7  PROT 5.2* 4.7*  ALBUMIN 1.9* 1.7*    CBG: Recent Labs  Lab 12/22/19 1943 12/22/19 2351 12/23/19 0344 12/23/19 0658 12/23/19 1102  GLUCAP 170* 159* 123* 122* 208*     Recent Results (from the past 240 hour(s))  Respiratory Panel by RT PCR (Flu A&B, Covid) - Nasopharyngeal Swab     Status: None   Collection Time: 12/17/19  2:20 PM   Specimen: Nasopharyngeal Swab  Result Value Ref Range Status   SARS Coronavirus 2 by RT PCR NEGATIVE NEGATIVE Final    Comment: (NOTE) SARS-CoV-2 target nucleic acids are NOT DETECTED. The SARS-CoV-2 RNA is generally detectable in upper respiratoy specimens during the acute phase of infection. The lowest concentration of SARS-CoV-2 viral copies this assay can detect is 131 copies/mL. A negative result does not preclude SARS-Cov-2 infection and should not be used as the sole basis for treatment or other patient management decisions. A negative result may occur with  improper specimen collection/handling, submission of specimen other than nasopharyngeal swab, presence of viral mutation(s) within the areas targeted by this assay, and inadequate number of viral copies (<131 copies/mL). A negative result must be combined with clinical observations, patient history, and epidemiological information. The expected result is Negative. Fact Sheet for Patients:  https://www.moore.com/ Fact Sheet for Healthcare Providers:  https://www.young.biz/ This test is not yet ap proved or cleared by the [place] FDA and  has been authorized for detection and/or diagnosis of SARS-CoV-2 by FDA under an Emergency Use Authorization (EUA). This EUA will remain  in effect (meaning this test can be used) for the duration of the COVID-19 declaration under Section 564(b)(1) of the Act, 21 U.S.C. section 360bbb-3(b)(1), unless the authorization is terminated or revoked sooner.    Influenza A by PCR NEGATIVE  NEGATIVE Final   Influenza B by PCR NEGATIVE NEGATIVE Final    Comment: (NOTE) The Xpert Xpress SARS-CoV-2/FLU/RSV assay is intended as an aid in  the diagnosis of influenza from Nasopharyngeal swab specimens and  should not be used as a sole basis for treatment. Nasal washings and  aspirates are unacceptable for Xpert Xpress SARS-CoV-2/FLU/RSV  testing. Fact Sheet for Patients: https://www.moore.com/ Fact Sheet for Healthcare Providers: https://www.young.biz/ This test is not yet approved or cleared by the [place] FDA and  has been authorized for detection and/or diagnosis of SARS-CoV-2 by  FDA under an Emergency Use Authorization (EUA). This EUA will remain  in effect (meaning this test can be used) for the duration of the  Covid-19 declaration under Section 564(b)(1) of the Act, 21  U.S.C. section 360bbb-3(b)(1), unless the authorization is  terminated or revoked. Performed at [facility], [address], [place], [place] [address]   Blood culture (routine x 2)     Status: None (Preliminary result)   Collection Time: 12/20/19  3:59 PM   Specimen: BLOOD  Result Value Ref Range Status   Specimen Description BLOOD RIGHT ANTECUBITAL  Final   Special Requests   Final    BOTTLES DRAWN AEROBIC AND ANAEROBIC Blood Culture adequate volume   Culture   Final    NO GROWTH 3 DAYS Performed at [facility],  [address] [place], [place] [address]    Report Status PENDING  Incomplete  Respiratory Panel by RT PCR (Flu A&B, Covid) - Nasopharyngeal Swab     Status: None   Collection Time: 12/20/19  4:46 PM   Specimen: Nasopharyngeal Swab  Result Value Ref Range Status   SARS Coronavirus 2 by RT PCR NEGATIVE NEGATIVE Final    Comment: (NOTE) SARS-CoV-2 target nucleic acids are NOT DETECTED. The SARS-CoV-2 RNA is generally detectable in upper respiratoy specimens during the acute phase of infection. The lowest concentration of  SARS-CoV-2 viral copies this assay can detect is 131 copies/mL. A negative result does not preclude SARS-Cov-2 infection and should not be used as the sole basis for treatment or other patient management decisions. A negative result may occur with  improper specimen collection/handling, submission of specimen other than nasopharyngeal swab, presence of viral mutation(s) within the areas targeted by this assay, and inadequate number of viral copies (<131 copies/mL). A negative result must be combined with clinical observations, patient history, and epidemiological information. The expected result is Negative. Fact Sheet for Patients:  https://www.moore.com/ Fact Sheet for Healthcare Providers:  https://www.young.biz/ This test is not yet ap proved or cleared by the [place] FDA and  has been authorized for detection and/or diagnosis of SARS-CoV-2 by FDA under an Emergency Use Authorization (EUA). This EUA will remain  in effect (meaning this test can be used) for the duration of the COVID-19 declaration under Section 564(b)(1) of the Act, 21 U.S.C. section 360bbb-3(b)(1), unless the authorization is terminated or revoked sooner.    Influenza A by PCR NEGATIVE NEGATIVE Final   Influenza B by PCR NEGATIVE NEGATIVE Final    Comment: (NOTE) The Xpert Xpress SARS-CoV-2/FLU/RSV assay is intended as an aid in  the diagnosis of influenza from Nasopharyngeal swab specimens and  should not be used as a sole basis for treatment. Nasal washings and  aspirates are unacceptable for Xpert Xpress SARS-CoV-2/FLU/RSV  testing. Fact Sheet for Patients: https://www.moore.com/ Fact Sheet for Healthcare Providers: https://www.young.biz/ This test is not yet approved or cleared by the [place] FDA and  has been authorized for detection and/or diagnosis of SARS-CoV-2 by  FDA under an Emergency Use Authorization (EUA).  This EUA will remain  in effect (meaning this test can be used) for the duration of the  Covid-19 declaration under Section 564(b)(1) of the Act, 21  U.S.C. section 360bbb-3(b)(1), unless the authorization is  terminated or revoked. Performed at [facility], [address], [place], [place] [address]   Urine Culture     Status: None   Collection Time: 12/20/19  4:53 PM   Specimen: Urine, Random  Result Value Ref Range Status   Specimen Description URINE, RANDOM  Final   Special Requests NONE  Final   Culture   Final    NO GROWTH Performed at [facility], [address] [place], [place] [address]    Report Status 12/21/2019 FINAL  Final  Blood culture (routine x 2)     Status: None (Preliminary result)   Collection Time: 12/20/19  5:21 PM   Specimen: BLOOD RIGHT HAND  Result Value Ref Range Status   Specimen Description BLOOD RIGHT HAND  Final   Special Requests   Final    BOTTLES DRAWN AEROBIC AND ANAEROBIC Blood Culture adequate volume   Culture   Final    NO GROWTH 3 DAYS Performed at [facility], [address] [place], [place]  [address]    Report Status PENDING  Incomplete         Radiology Studies: ECHOCARDIOGRAM COMPLETE  Result Date: 12/23/2019    ECHOCARDIOGRAM REPORT   Patient Name:   BANEEN WIESELER Date of Exam: 12/23/2019 Medical Rec #:  [id number]      Height:       64.0 in Accession #:    [id number]     Weight:       221.1 lb Date of Birth:  [birth year]       BSA:          2.042 m Patient Age:    74 years       BP:           133/59 mmHg Patient Gender: F              HR:           66 bpm. Exam Location:  Inpatient Procedure: 2D Echo Indications:    cardiopulmonary arrest  History:        Patient has prior history of Echocardiogram examinations, most                 recent 12/08/2019. Arrythmias:Atrial Fibrillation; Risk                 Factors:Diabetes and Hypertension.  Sonographer:    Celene Skeen RDCS (AE) Referring Phys: [id number] [address] Bong  Sonographer Comments: limited mobility. off axis apical windows IMPRESSIONS  1. Since the last study on 12/08/2019 LVEF hasn't changes. Right ventricle is now moderately dilated with moderately decreased systolic function and new moderate pulmonary hypertension. Consider pulmonary embolism.  2. Left ventricular ejection fraction, by estimation, is 65 to 70%. The left ventricle has normal function. The left ventricle has no regional wall motion abnormalities. Left ventricular diastolic parameters are consistent with Grade II diastolic dysfunction (pseudonormalization).  3. Right ventricular systolic function is moderately reduced. The right ventricular size is moderately enlarged. There is moderately elevated pulmonary artery systolic pressure. The estimated right ventricular systolic pressure is 59.6 mmHg.  4. Left atrial size was mildly dilated.  5. Right atrial size was moderately dilated.  6. The mitral valve is normal in structure. No evidence of mitral valve regurgitation. No evidence of mitral stenosis.  7. The aortic valve is normal in structure. Aortic valve regurgitation is not visualized. Mild to moderate aortic valve sclerosis/calcification is present, without any evidence of aortic stenosis.  8. The inferior vena cava is dilated in size with <50% respiratory variability, suggesting right atrial pressure of 15 mmHg. FINDINGS  Left Ventricle: Left ventricular ejection fraction, by estimation, is 65 to 70%. The left ventricle has normal function. The left ventricle has no regional wall motion abnormalities. The left ventricular internal cavity size was normal in size. There is  no left ventricular hypertrophy. Left ventricular diastolic parameters are consistent with Grade II diastolic dysfunction (pseudonormalization). Normal left ventricular filling pressure. Right Ventricle: The right ventricular size is moderately enlarged. No increase in right ventricular wall thickness. Right ventricular  systolic function is moderately reduced. There is moderately elevated pulmonary artery systolic pressure. The tricuspid  regurgitant velocity is 3.34 m/s, and with an assumed right atrial pressure of 15 mmHg, the estimated right ventricular systolic pressure is 59.6 mmHg. Left Atrium: Left atrial size was mildly dilated. Right Atrium: Right atrial size was moderately dilated. Pericardium: There is no evidence of pericardial effusion. Mitral Valve: The mitral valve is normal in structure. Normal mobility of  the mitral valve leaflets. No evidence of mitral valve regurgitation. No evidence of mitral valve stenosis. Tricuspid Valve: The tricuspid valve is normal in structure. Tricuspid valve regurgitation is mild . No evidence of tricuspid stenosis. Aortic Valve: The aortic valve is normal in structure. Aortic valve regurgitation is not visualized. Mild to moderate aortic valve sclerosis/calcification is present, without any evidence of aortic stenosis. Pulmonic Valve: The pulmonic valve was normal in structure. Pulmonic valve regurgitation is not visualized. No evidence of pulmonic stenosis. Aorta: The aortic root is normal in size and structure. Venous: The inferior vena cava is dilated in size with less than 50% respiratory variability, suggesting right atrial pressure of 15 mmHg. IAS/Shunts: No atrial level shunt detected by color flow Doppler.  LEFT VENTRICLE PLAX 2D LVIDd:         5.00 cm  Diastology LVIDs:         3.00 cm  LV e' lateral:   8.05 cm/s LV PW:         1.00 cm  LV E/e' lateral: 10.6 LV IVS:        1.00 cm  LV e' medial:    8.70 cm/s LVOT diam:     2.00 cm  LV E/e' medial:  9.8 LV SV:         54 LV SV Index:   26 LVOT Area:     3.14 cm  RIGHT VENTRICLE RV S prime:     10.00 cm/s TAPSE (M-mode): 2.0 cm LEFT ATRIUM           Index       RIGHT ATRIUM           Index LA diam:      4.40 cm 2.16 cm/m  RA Area:     17.20 cm LA Vol (A4C): 30.1 ml 14.74 ml/m RA Volume:   43.10 ml  21.11 ml/m  AORTIC VALVE  LVOT Vmax:   81.60 cm/s LVOT Vmean:  50.700 cm/s LVOT VTI:    0.172 m  AORTA Ao Root diam: 2.80 cm MITRAL VALVE               TRICUSPID VALVE MV Area (PHT): 3.91 cm    TR Peak grad:   44.6 mmHg MV Decel Time: 194 msec    TR Vmax:        334.00 cm/s MV E velocity: 85.30 cm/s MV A velocity: 84.40 cm/s  SHUNTS MV E/A ratio:  1.01        Systemic VTI:  0.17 m                            Systemic Diam: 2.00 cm Ena Dawley MD Electronically signed by Ena Dawley MD Signature Date/Time: 12/23/2019/1:25:23 PM    Final         Scheduled Meds: . apixaban  5 mg Oral BID  . atorvastatin  10 mg Oral Daily  . divalproex  500 mg Oral BID  . insulin aspart  0-20 Units Subcutaneous TID WC  . mouth rinse  15 mL Mouth Rinse BID  . mupirocin ointment  1 application Nasal BID  . senna-docusate  1 tablet Oral Daily   Continuous Infusions: . ampicillin-sulbactam (UNASYN) IV 3 g (12/23/19 1045)  . vancomycin 1,000 mg (12/23/19 2778)     LOS: 3 days    Time spent: 45 minutes    Irine Seal, MD [facility]   To contact the attending provider  between 7A-7P or the covering provider during after hours 7P-7A, please log into the web site [email or web address] and access using universal [place] password for that web site. If you do not have the password, please call the hospital operator.  12/23/2019, 3:17 PM

## 2019-12-23 NOTE — Progress Notes (Signed)
Occupational Therapy Evaluation  Unsure of PLOF, however per chart review pt was living alone in December 2020 prior to Covid. Pt overall requires +2 Total A for mobility and Max to total A with ADL tasks due to deficits listed below. Pt with significant RUE weakness as compared to L - nsg notified.  Will need clarification regarding pt's ability to sit OOB in chair given wound in order to progress mobility. Plan is to return to SNF to continue rehab. Will follow acutely    12/23/19 1600  OT Visit Information  Last OT Received On 12/23/19  Assistance Needed +2  PT/OT/SLP Co-Evaluation/Treatment Yes  Reason for Co-Treatment Complexity of the patient's impairments (multi-system involvement);For patient/therapist safety;To address functional/ADL transfers  OT goals addressed during session ADL's and self-care;Strengthening/ROM  History of Present Illness 74 Y/o with PMH of afib, bipolar, HTN, Obesity, DM, Pressure ulcer with recent admission for left buttock abscess s.p drainage brought to ED after arrest requiring brief CPR at nursing home.  pt was intubated in the field.  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  Pt extubated 5/11.  Precautions  Precautions Fall  Precaution Comments sacral wound, stage IV with osteo  Restrictions  Weight Bearing Restrictions No  Home Living  Family/patient expects to be discharged to: Skilled nursing facility  Additional Comments Per chart, pt admitted to hospital December 2020 and discharged to a SNF. Prior to December 2020, pt was living at home independently.   pt reports her living situation as living alone in a 1-level condo with shower and reg-height commode, driving etc.  Prior Function  Level of Independence Needs assistance  Gait / Transfers Assistance Needed Per chart pt lived alone in december 2020 pre-covid, pt reported "I haven't walked in a long time."  ADL's /  Homemaking Assistance Needed Pt receiveing assistance for ADL at SNF; Need clarification  Communication  Communication No difficulties  Pain Assessment  Pain Assessment Faces  Faces Pain Scale 2  Pain Location generalized discomfort  Pain Descriptors / Indicators Grimacing  Pain Intervention(s) Limited activity within patient's tolerance  Cognition  Arousal/Alertness Awake/alert  Behavior During Therapy Flat affect  Overall Cognitive Status No family/caregiver present to determine baseline cognitive functioning  Area of Impairment Orientation;Attention;Memory;Following commands;Safety/judgement;Awareness;Problem solving  Orientation Level Disoriented to;Place;Time;Situation  Current Attention Level Sustained  Memory Decreased recall of precautions;Decreased short-term memory  Following Commands Follows one step commands inconsistently;Follows one step commands with increased time  Safety/Judgement Decreased awareness of safety;Decreased awareness of deficits  Awareness Intellectual  Problem Solving Slow processing;Decreased initiation;Difficulty sequencing;Requires verbal cues;Requires tactile cues  Upper Extremity Assessment  Upper Extremity Assessment RUE deficits/detail;LUE deficits/detail  RUE Deficits / Details edematous hand; Pt with contractures in IP joints with limitations of @ 30 degrees - lacking extensionnot moving RUE as well as L; Pt states she is R hand dominant; Unable to complete hand to mouth pattern due to weakness; isolated movemetn presetn but weak; Unable to maintain grasp on object with R hand. Also affected by inattention  RUE Coordination decreased fine motor;decreased gross motor  LUE Deficits / Details generalized weakness  Lower Extremity Assessment  RLE Deficits / Details pt showed no initiation of LE movement to command and trace/minimal movement once assisted.  Difficult to discern awareness of light touch.  RLE Coordination decreased fine motor;decreased  gross motor  LLE Deficits / Details Pt showed no initiation of L LE movement to command, nor did she  assist flexion.  She showed trace to minimal resistance in gross extention.  LLE Coordination decreased fine motor;decreased gross motor  Cervical / Trunk Assessment  Cervical / Trunk Assessment Other exceptions (posterior bias)  ADL  Overall ADL's  Needs assistance/impaired  Eating/Feeding Moderate assistance  Eating/Feeding Details (indicate cue type and reason) May benefit from AE  Grooming Maximal assistance  Upper Body Bathing Maximal assistance;Bed level  Lower Body Bathing Total assistance;Bed level  Upper Body Dressing  Total assistance;Sitting  Lower Body Dressing Total assistance;Bed level  Functional mobility during ADLs  (not attempted)  General ADL Comments weakness, cognitive deficits affecting ability to complete ADL tasks; easily distracted by anything  Vision- Assessment  Additional Comments decreased visual attention. States she wears glasses but not in her room  Perception  Comments impaired spatail relations; will assess  Praxis  Praxis tested? Deficits  Deficits Initiation  Bed Mobility  Overal bed mobility Needs Assistance  Bed Mobility Sit to Supine;Supine to Sit  Supine to sit Total assist;+2 for physical assistance  Sit to supine Total assist;+2 for physical assistance  General bed mobility comments pt did not initiate movement to command or assist therapist once assist give to patient.  Attempted to get pt to push with L UE with hand over hand assist without success.  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 Other exercises;General Upper Extremity  General Exercises - Upper Extremity  Shoulder Flexion AAROM;AROM;Both;10 reps;Supine  Elbow Flexion AROM;AAROM;Both;10 reps;Supine  Elbow Extension AROM;AAROM;Both;10 reps;Supine  Digit Composite Flexion AROM;AAROM;Both;10 reps;Seated  Composite Extension AROM;AAROM;Both;10 reps;Seated  OT - End of Session  Equipment Utilized During Treatment Oxygen (2L)  Activity Tolerance Patient limited by fatigue  Patient left in bed;with call bell/phone within reach;with bed alarm set;with SCD's reapplied  Nurse Communication Mobility status;Need for lift equipment (will need Maximsky)  OT Assessment  OT Recommendation/Assessment Patient needs continued OT Services  OT Visit Diagnosis Other abnormalities of gait and mobility (R26.89);Muscle weakness (generalized) (M62.81);Other symptoms and signs involving cognitive function;Pain  Pain - part of body  (generalized)  OT Problem List Decreased strength;Decreased range of motion;Decreased activity tolerance;Impaired balance (sitting and/or standing);Impaired vision/perception;Decreased coordination;Decreased cognition;Decreased safety awareness;Decreased knowledge of use of DME or AE;Decreased knowledge of precautions;Cardiopulmonary status limiting activity;Impaired sensation;Impaired tone;Obesity;Impaired UE functional use;Pain;Increased edema  OT Plan  OT Frequency (ACUTE ONLY) Min 2X/week  OT Treatment/Interventions (ACUTE ONLY) Self-care/ADL training;Therapeutic exercise;Neuromuscular education;Energy conservation;DME and/or AE instruction;Therapeutic activities;Cognitive remediation/compensation;Visual/perceptual remediation/compensation;Patient/family education;Balance training  AM-PAC OT "6 Clicks" Daily Activity Outcome Measure (Version 2)  Help from another person eating meals? 2  Help from another person taking care of personal grooming? 2  Help from another person toileting, which includes  using toliet, bedpan, or urinal? 1  Help from another person bathing (including washing, rinsing, drying)? 2  Help from another person to put on and taking off regular upper body clothing? 1  Help from another person to put on and taking off regular lower body clothing? 1  6 Click Score 9  OT Recommendation  Follow Up Recommendations SNF;Supervision/Assistance - 24 hour  OT Equipment None recommended by OT  Individuals Consulted  Consulted and Agree with Results and  Recommendations Patient unable/family or caregiver not available  Acute Rehab OT Goals  Patient Stated Goal to not be sick anymore  OT Goal Formulation Patient unable to participate in goal setting  Time For Goal Achievement 01/06/20  Potential to Achieve Goals Fair  OT Time Calculation  OT Start Time (ACUTE ONLY) 1420  OT Stop Time (ACUTE ONLY) 1453  OT Time Calculation (min) 33 min  OT General Charges  $OT Visit 1 Visit  OT Evaluation  $OT Eval Moderate Complexity 1 Mod  Written Expression  Dominant Hand Right  Maurie Boettcher, OT/L   Acute OT Clinical Specialist Acute Rehabilitation Services Pager [phone] Office [phone]

## 2019-12-23 NOTE — Progress Notes (Signed)
  Echocardiogram 2D Echocardiogram has been performed.  Becky Hendricks 12/23/2019, 11:52 AM

## 2019-12-23 NOTE — Evaluation (Signed)
Clinical/Bedside Swallow Evaluation Patient Details  Name: Becky Hendricks MRN: [id number] Date of Birth: Oct 30, 1945  Today's Date: 12/23/2019 Time: SLP Start Time (ACUTE ONLY): 0820 SLP Stop Time (ACUTE ONLY): 0835 SLP Time Calculation (min) (ACUTE ONLY): 15 min  Past Medical History:  Past Medical History:  Diagnosis Date  . Atrial fibrillation ([place])   . Bipolar disorder ([place])   . Clostridium difficile diarrhea 08/02/2019  . Dehydration   . Essential hypertension   . Morbid obesity ([place])   . Type 2 diabetes mellitus ([employer])   . Weakness    Past Surgical History:  Past Surgical History:  Procedure Laterality Date  . INCISION AND DRAINAGE PERIRECTAL ABSCESS Left 12/04/2019   Procedure: IRRIGATION AND DEBRIDEMENT LEFT BUTTOCK ABSCESS;  Surgeon: Georganna Skeans, MD;  Location: [facility];  Service: General;  Laterality: Left;  . INCISION AND DRAINAGE PERIRECTAL ABSCESS Left 12/10/2019   Procedure: REPEAT IRRIGATION AND DEBRIDEMENT BUTTOCK  ABSCESS;  Surgeon: Coralie Keens, MD;  Location: [place];  Service: General;  Laterality: Left;   HPI:  74 Y/o with PMH of afib, bipolar, HTN, Obesity, DM, Pressure ulcer with recent admission for left buttock abscess s.p drainage brought to ED after arrest requiring brief CPR at nursing home. Intubated 12/20/19-5/11-21. Seen previously by ST prior hospitalizations, on regular solids and thin liquids.   Assessment / Plan / Recommendation Clinical Impression  Becky Hendricks is well known to ST service from prior hospitalizations with largely unremarkable swallow history. RN and patient had no concerns regarding swallow function. She had difficulty fully participating in oral mech exam d/t reduced mentation (baseline cognitive deficits), however, structures appeared functional with no asymmetry or abnormality noted. She consumed thin liquids via cup/straw and regular graham cracker without any s/s aspiration or dysphagia. Based on presentation during evaluation and  RN report, recommend continue thin liquids and regular solids, meds as tolerated (whole with liquids or with puree). No further ST indicated.   SLP Visit Diagnosis: Dysphagia, unspecified (R13.10)    Aspiration Risk  Mild aspiration risk    Diet Recommendation Regular;Thin liquid   Liquid Administration via: Cup;Straw Medication Administration: Whole meds with liquid Supervision: Patient able to self feed Postural Changes: Seated upright at 90 degrees    Other  Recommendations Oral Care Recommendations: Oral care BID   Follow up Recommendations None          Prognosis   Good     Swallow Study   General HPI: 74 Y/o with PMH of afib, bipolar, HTN, Obesity, DM, Pressure ulcer with recent admission for left buttock abscess s.p drainage brought to ED after arrest requiring brief CPR at nursing home. Intubated 12/20/19-5/11-21. Seen previously by ST prior hospitalizations, on regular solids and thin liquids. Type of Study: Bedside Swallow Evaluation Previous Swallow Assessment: 12/12/19 [facility] Diet Prior to this Study: Regular;Thin liquids Temperature Spikes Noted: No Respiratory Status: Nasal cannula History of Recent Intubation: Yes Length of Intubations (days): 2 days Date extubated: 12/21/19 Behavior/Cognition: Alert;Cooperative Oral Cavity Assessment: Within Functional Limits Oral Care Completed by SLP: Recent completion by staff Oral Cavity - Dentition: Adequate natural dentition Vision: Functional for self-feeding Self-Feeding Abilities: Able to feed self Patient Positioning: Upright in bed Baseline Vocal Quality: Normal Volitional Cough: Strong Volitional Swallow: Able to elicit    Oral/Motor/Sensory Function Overall Oral Motor/Sensory Function: Other (comment)(not fully assessed d/t difficulty following instructions)   Ice Chips Ice chips: Not tested   Thin Liquid Thin Liquid: Within functional limits Presentation: Cup;Straw    Nectar Thick Nectar  Thick Liquid: Not  tested   Honey Thick Honey Thick Liquid: Not tested   Puree Puree: Not tested   Solid    Becky Hendricks P. Becky Hendricks, M.S., CCC-SLP Speech-Language Pathologist Acute Rehabilitation Services Pager: [phone]  Solid: Within functional limits Presentation: Self Fed      Becky Hendricks P Becky Hendricks 12/23/2019,9:55 AM

## 2019-12-23 NOTE — Progress Notes (Signed)
NAME:  Becky Hendricks, MRN:  [id number], DOB:  1945/09/21, LOS: 3 ADMISSION DATE:  12/20/2019, CONSULTATION DATE:  12/20/19 REFERRING MD:  Anitra Lauth MD, ED, CHIEF COMPLAINT:  Cardiopulmonary arrest  Brief History   Ms. Satterly is a 74 yo F with h/o AF on Eliquis, bipolar disorder, HTN, T2DM, who presented after out of hospital cardiopulmonary arrest. Pt was recently discharged on 12/17/19 stage IV pressure ulcer to be on Unasyn + Vanc until 6/10. On 5/9, staff at the nursing home reported she didn't "look right" at lunch. She was later found in her room agonally breathing without a pulse. She underwent 1-2 minutes of CPR with ROSC. She was intubated in the field and brought to [facility] ED. In the ED, SpO2 100 intubated, BP 120s/90s on epi, blood glucose 300, lactate 6. WBC 11.3 from 10.8 on discharge. ABG was 7.42/64/272/42. Troponin 76 and 161 six hours later. Respiratory panel negative, EKG showed NSR without ischemic changes. She was minimally responsive despite no sedation; she was started on Levophed for BP support and transitioned to Meropenem and admitted to [facility].   Past Medical History  Chronic respiratory failure on 2 L nasal cannula Obesity  type 2 diabetes mellitus Hypertension baseline bipolar disease w/ some baseline confusion stage IV pressure ulcer involving the gluteal fold with bilateral ulcers on the right and left buttocks with osteomyelitis of the coccyx. Recently discharged 5/6 on Unasyn and vancomycin  Significant Hospital Events   5/9: Brief out of hospital cardiopulmonary arrest; ETT ; admitted   Consults:  PCCM  Procedures:  5/9: ETT in field   Significant Diagnostic Tests:  5/9: CT Head wo contrast: 1. No acute intracranial pathology. 2. Age-related atrophy and chronic microvascular ischemic changes.  5/9: CT Abd Pelvis w Contrast: 1. 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 2. Stable decubitus ulcer with osteomyelitis involving  distal sacrum and coccyx. 3. Cholelithiasis. No radiographic evidence of cholecystitis. 4. Stable small uterine fibroids. 5. Increased small right pleural effusion and bibasilar atelectasis.  5/9: CXR: 1. Left internal jugular catheter coiled over the left subclavian vein, with tip normally positioned over the superior vena cava. Please correlate with catheter function. 2. Increasing right basilar consolidation and/or effusion.  5/10 EEG: moderate to severe diffuse encephalopathy, nonspecific etiology. No seizures or definite epileptiform discharges were seen throughout the recording.  Micro Data:  BC x2 NGTD Urine culture NG  Respiratory panel: negative SARS-CoV-2/Influenza A/Influenza B  Antimicrobials:  4/22 - 4/28: Vanc + Cefepime + lagyl  4/28 - 5/9: Vanc + Unasyn (to be continued until 01/21/20) 5/9 - 5/10: Vanc + Meropenem   5/11 - present: Vanc + Unasyn   Interim history/subjective:  NAE. Extubated yesterday and continues to do well. VSS. SpO2 100 on 2LNC. Levophed titrated off at 1200. WBC downtrending again to 8.9.   Objective   Blood pressure (!) 133/59, pulse (!) 56, temperature 97.6 F (36.4 C), temperature source Oral, resp. rate 19, height 5\' 4"  (1.626 m), weight 100.3 kg, SpO2 100 %.        Intake/Output Summary (Last 24 hours) at 12/23/2019 0813 Last data filed at 12/23/2019 0600 Gross per 24 hour  Intake 1383.26 ml  Output 830 ml  Net 553.26 ml   Filed Weights   12/21/19 0500 12/22/19 0500 12/23/19 0341  Weight: 94.7 kg 98.4 kg 100.3 kg    Examination: Gen: Elderly woman in bed in NAD. Alert, responds to questions appropriately. A&O to person and place.  HEENT: atraumatic, normocephalic, sclera anicteric, EOMI, PERRL  Neck: no cervical lymphadenopathy or thyromegaly, no JVD Heart: RRR, S1, S2, no M/R/G, no chest wall tenderness Lungs: lungs rhoncherous throughout, no crackles or wheezes Abdomen: Normoactive bowel sounds, soft, NTND, no  rebound/guarding Extremities: no clubbing, cyanosis, or edema: pulses are +2 in bilateral upper and lower extremities Neuro: CN II-XI grossly intact. Moves all extremities equally. No aphasia.    Resolved Hospital Problem list   none  Assessment & Plan:  74 yo F with h/o AF on Eliquis, and stage IV decubitus ulcer presented to [facility] after out of hospital cardiopulmonary arrest.   Cardiopulmonary Arrest: Pt had cardiopulmonary arrest on 5/9 at her nursing home requiring 1-2 minutes of CPR before ROSC. At this point, most likely d/t sepsis/shock. Pt has had worrisome hypotension on pressors in the setting of osteomyelitis and stage IV polymicrobial ulcer. Also considered PE, however less likely given she is compliant with Eliquis. Primary pulmonary cause would be unlikely; CXR did show some bibasilar infiltrate but repeat shows R pleural effusion not c/f new PNA. Primary CV event also unlikely given normal echo on 4/27 (LVEF 60-65%, no structural heart disease) and only mildly elevated troponin c/w demand ischemia. Pt extubated successfully on 5/11, continues to do well on [facility]. - Continue resp support prn  - Tele  - Repeat TTE pending  Sepsis/Shock: Most likely 2/2 osteomyelitis of sacrum/coccyx, though alternate soures include polymicrobial decubitus ulcer and strep viridans bacteremia found last week. Pt is being adequately treated for these on Unasyn and Vanc, however, so the reason for her decompensation is unclear.  CT abd pelvis showed significant decrease in size of left buttock fistula and abscess since previous study; stable decubitus ulcer with osteomyelitis involving distal sacrum and coccyx. CXR showed some bibasilar infiltrate, repeat CXR showed R pleural effusion; at this point, I'm not concerned for new PNA as a cause. Pt had midline from prior hospitalization which could also be a cause but BC remain NGTD; this was removed on 5/10. She is s/p 1 L NS bolus and mIVF LR; Lactate  downtrended. Levophed stopped at 1200 on 5/11, pressures robust. Overall, she is improving.  - BC x2 NGTD  - Continue Vanc + Unasyn until 01/21/20  - Will need new line prior to dc   Acute Metabolic Encephalopathy: CT head without acute changes. Likely related to her cardiopulmonary arrest. EEG on 5/10 showed moderate to severe diffuse encephalopathy, nonspecific etiology. No seizures or definite epileptiform discharges were seen throughout the recording.  She is now alert and oriented to person and place, struggles with situational orientation however is able to carry on conversation. Does not recall much of her arrest event.  - NTD  Pressure Ulcer  Osteomyelitis: Pt has stage IV pressure ulcer recently discharged on 12/17/19 on Unasyn and Vanc until 01/21/20, as above.  - Wound care consulted, appreciate recs  - Antibx as above   Atrial Fibrillation: Pt remains in NSR.  - Continue home Eliquis   Bipolar Disorder:  - Continue home Depakote  T2DM: BG well controlled 110s-160s yesterday.  -SSI  - Continue home atorvastatin   FEN: Will aggressively replete lytes given sepsis.  Phos 5.3 yesterday, will give Ca Carbonate as binder - Ca carbonate 500 mg for 3 doses for elevated phos  - Replete K, Phos, Mg, Ca as needed - LR mIVF - Stop tube feeds after extubation ; swallow study then reg diet  - Senna, miralax    Best practice:  Diet: Advance diet as tolerated Pain/Anxiety/Delirium protocol (if indicated): Extubated, no sedation VAP protocol (if indicated): per protocol DVT prophylaxis: Eliquis  GI prophylaxis: Protonix Glucose control: SSI Mobility: OOB Code Status: FULL Family Communication: Spoke with brother 5/11 on rounds Disposition: ICU     Ivin Poot, MS4

## 2019-12-24 ENCOUNTER — Inpatient Hospital Stay (HOSPITAL_COMMUNITY): Payer: Medicare Other

## 2019-12-24 DIAGNOSIS — R600 Localized edema: Secondary | ICD-10-CM

## 2019-12-24 LAB — CBC WITH DIFFERENTIAL/PLATELET
Abs Immature Granulocytes: 0.03 10*3/uL (ref 0.00–0.07)
Basophils Absolute: 0 10*3/uL (ref 0.0–0.1)
Basophils Relative: 0 %
Eosinophils Absolute: 0.3 10*3/uL (ref 0.0–0.5)
Eosinophils Relative: 5 %
HCT: 29.1 % — ABNORMAL LOW (ref 36.0–46.0)
Hemoglobin: 8.6 g/dL — ABNORMAL LOW (ref 12.0–15.0)
Immature Granulocytes: 1 %
Lymphocytes Relative: 32 %
Lymphs Abs: 2.1 10*3/uL (ref 0.7–4.0)
MCH: 29.1 pg (ref 26.0–34.0)
MCHC: 29.6 g/dL — ABNORMAL LOW (ref 30.0–36.0)
MCV: 98.3 fL (ref 80.0–100.0)
Monocytes Absolute: 0.9 10*3/uL (ref 0.1–1.0)
Monocytes Relative: 14 %
Neutro Abs: 3.2 10*3/uL (ref 1.7–7.7)
Neutrophils Relative %: 48 %
Platelets: 124 10*3/uL — ABNORMAL LOW (ref 150–400)
RBC: 2.96 MIL/uL — ABNORMAL LOW (ref 3.87–5.11)
RDW: 20.8 % — ABNORMAL HIGH (ref 11.5–15.5)
WBC: 6.6 10*3/uL (ref 4.0–10.5)
nRBC: 0 % (ref 0.0–0.2)

## 2019-12-24 LAB — RENAL FUNCTION PANEL
Albumin: 1.7 g/dL — ABNORMAL LOW (ref 3.5–5.0)
Anion gap: 7 (ref 5–15)
BUN: 5 mg/dL — ABNORMAL LOW (ref 8–23)
CO2: 35 mmol/L — ABNORMAL HIGH (ref 22–32)
Calcium: 8 mg/dL — ABNORMAL LOW (ref 8.9–10.3)
Chloride: 98 mmol/L (ref 98–111)
Creatinine, Ser: 0.38 mg/dL — ABNORMAL LOW (ref 0.44–1.00)
GFR calc Af Amer: >60 mL/min (ref >60)
GFR calc non Af Amer: >60 mL/min (ref >60)
Glucose, Bld: 112 mg/dL — ABNORMAL HIGH (ref 70–99)
Phosphorus: 3 mg/dL (ref 2.5–4.6)
Potassium: 4.3 mmol/L (ref 3.5–5.1)
Sodium: 140 mmol/L (ref 135–145)

## 2019-12-24 LAB — PHOSPHORUS: Phosphorus: 3 mg/dL (ref 2.5–4.6)

## 2019-12-24 LAB — GLUCOSE, CAPILLARY
Glucose-Capillary: 108 mg/dL — ABNORMAL HIGH (ref 70–99)
Glucose-Capillary: 210 mg/dL — ABNORMAL HIGH (ref 70–99)
Glucose-Capillary: 182 mg/dL — ABNORMAL HIGH (ref 70–99)
Glucose-Capillary: 173 mg/dL — ABNORMAL HIGH (ref 70–99)

## 2019-12-24 LAB — RPR: RPR Ser Ql: NONREACTIVE

## 2019-12-24 IMAGING — CT CT ANGIO CHEST
2 of 6 series · 18 of 36 positions shown · IV contrast (omnipaque)
Comparison: Chest radiograph [DATE]

CLINICAL DATA: Shortness of breath

EXAM:
CT ANGIOGRAPHY CHEST WITH CONTRAST
TECHNIQUE: Multidetector CT imaging of the chest was performed using the
standard protocol during bolus administration of intravenous
contrast. Multiplanar CT image reconstructions and MIPs were
obtained to evaluate the vascular anatomy.
CONTRAST:  100 mL OMNIPAQUE IOHEXOL 350 MG/ML SOLN

[Series 8: pe thins · axial · 0.65mm/px · z∈[+1109,+1310]mm · 17 of 227 slices shown]
[im 13/227  lung]
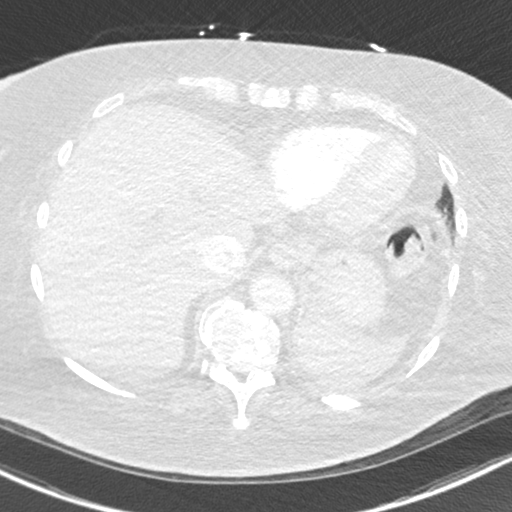
[im 26/227  mediastinal]
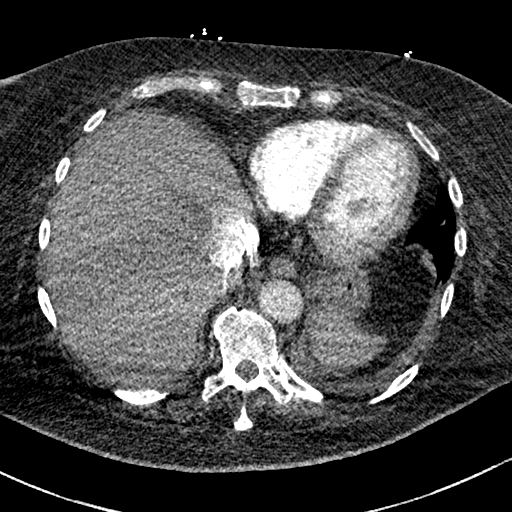
[im 38/227  lung]
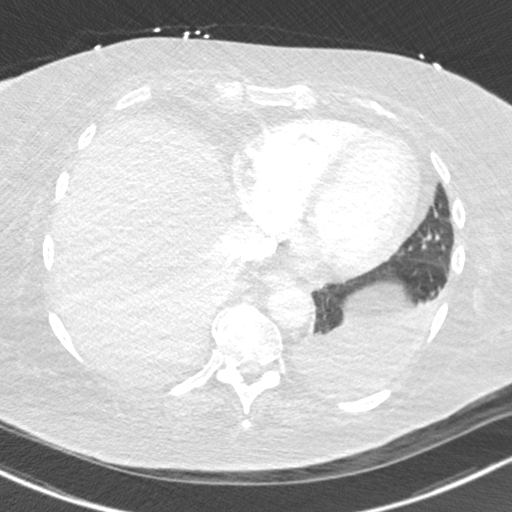
[im 51/227  mediastinal]
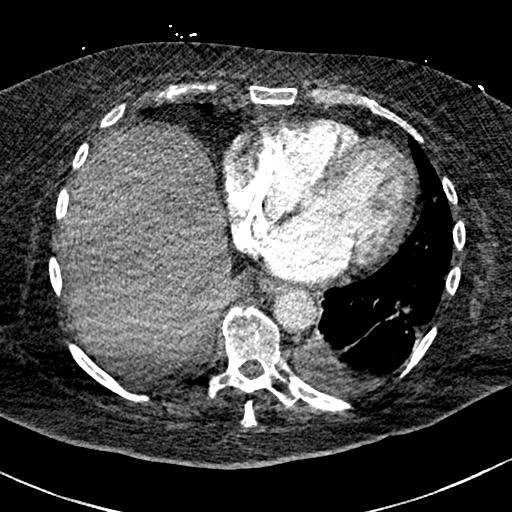
[im 63/227  lung]
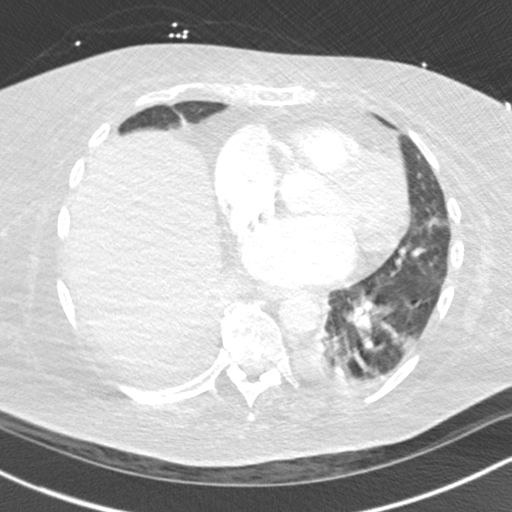
[im 76/227  mediastinal]
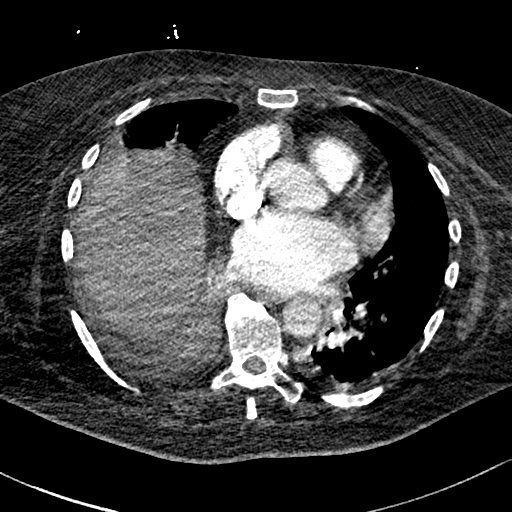
[im 88/227  lung]
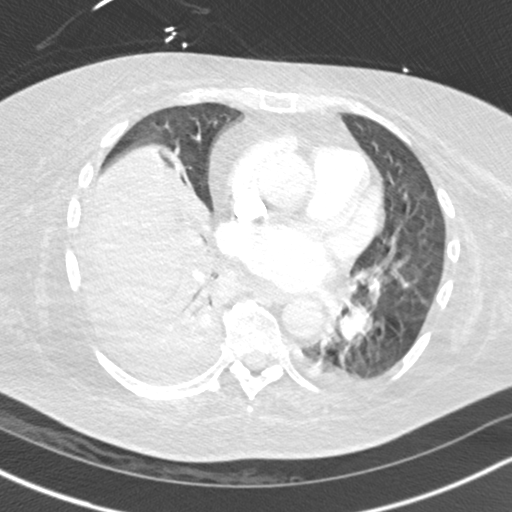
[im 101/227  mediastinal]
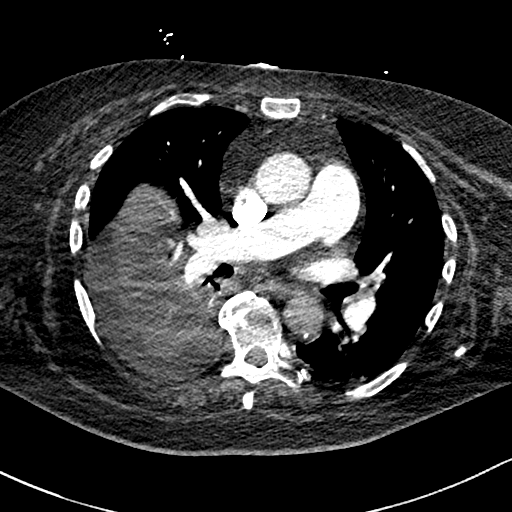
[im 114/227  lung]
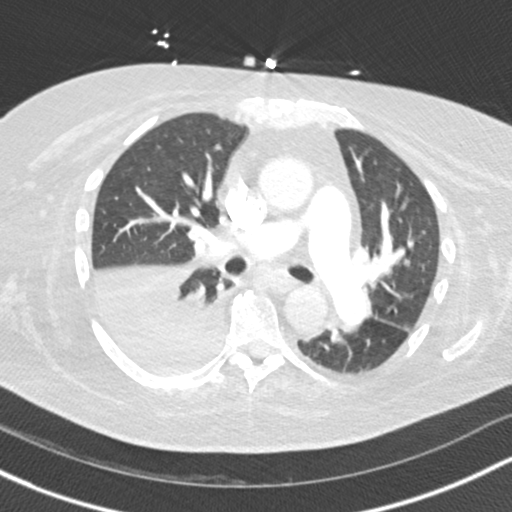
[im 126/227  mediastinal]
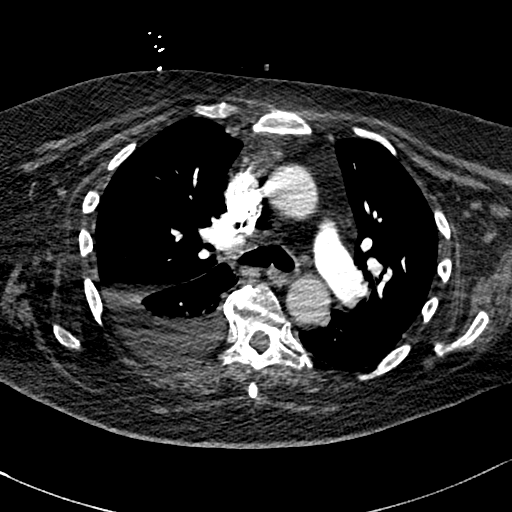
[im 139/227  lung]
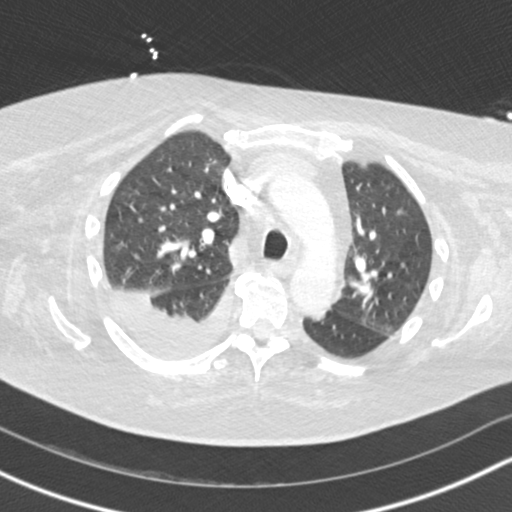
[im 151/227  mediastinal]
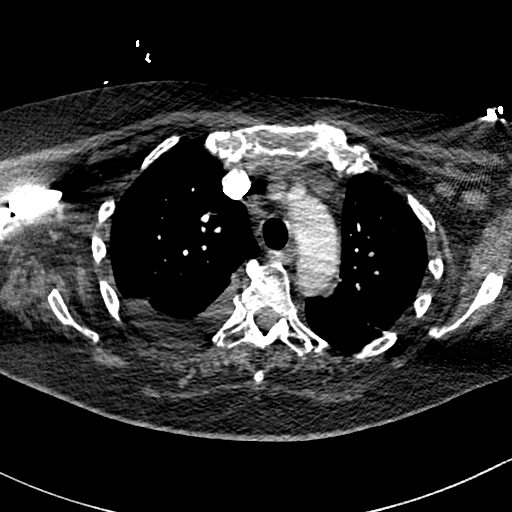
[im 164/227  lung]
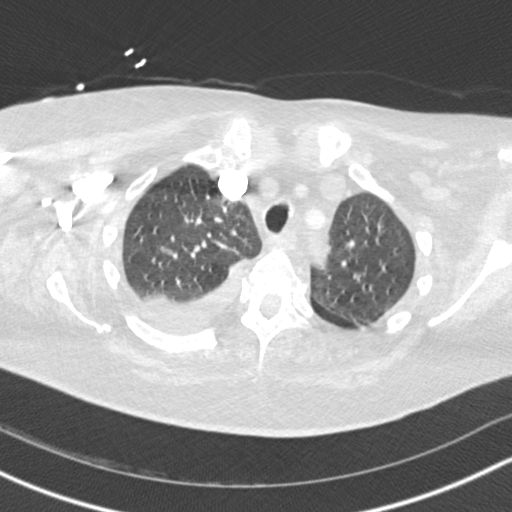
[im 176/227  mediastinal]
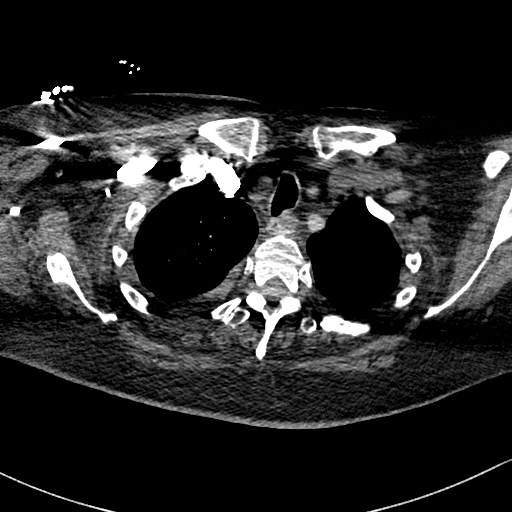
[im 189/227  lung]
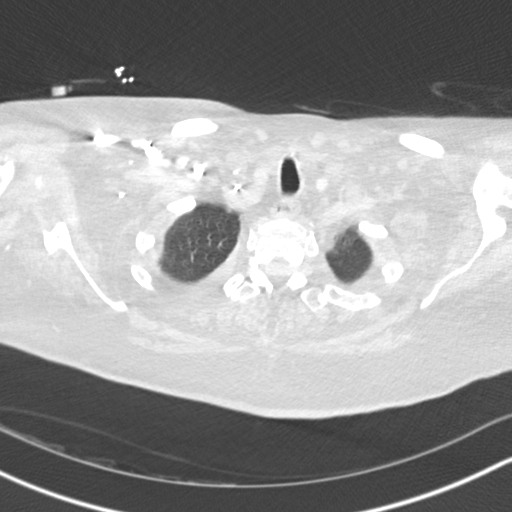
[im 201/227  mediastinal]
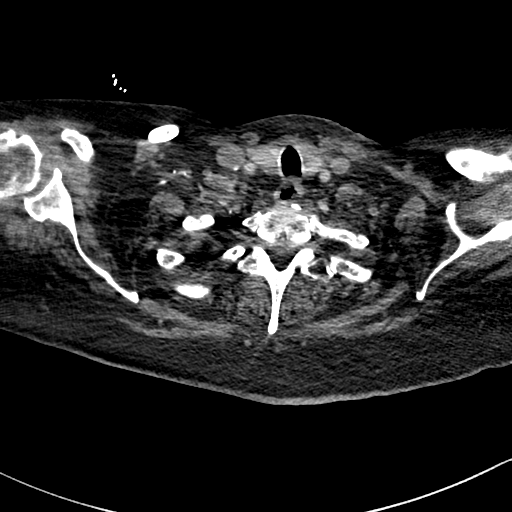
[im 214/227  lung]
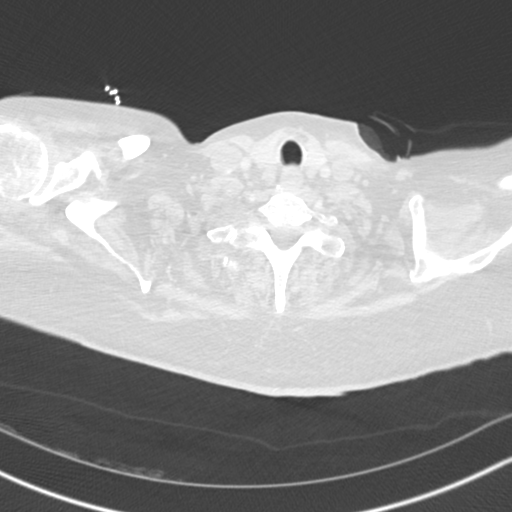

[Series 9: pe 2mm cor · coronal · 0.46mm/px · 1 of 151 slices shown]
[im 76/151  mediastinal]
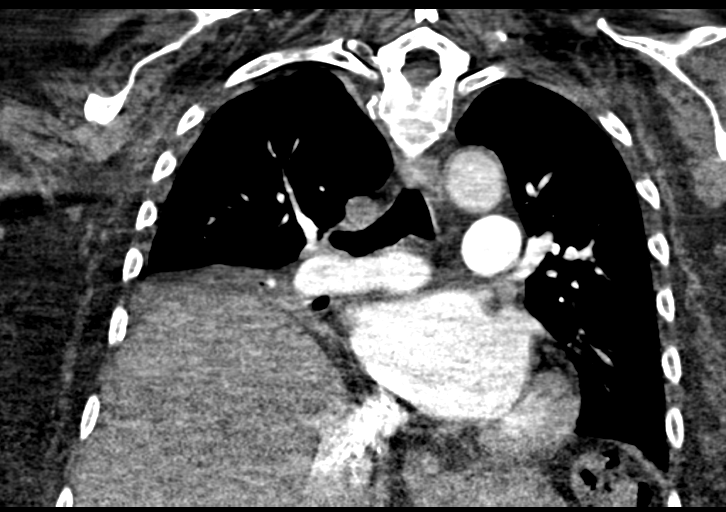

[18 of 36 positions shown; findings below may reference images not displayed]

FINDINGS: Cardiovascular: There is no demonstrable pulmonary embolus. There is
no appreciable thoracic aortic aneurysm or dissection. Visualized
great vessels appear unremarkable. No appreciable pericardial
effusion or pericardial thickening evident.

Mediastinum/Nodes: Visualized thyroid appears normal. There is no
evident thoracic adenopathy. No esophageal lesions are appreciable.

Lungs/Pleura: No pneumothorax. There is a moderate pleural effusion
on the right with compressive atelectasis in the right lower lobe.
There may well be associated pneumonia in the right lower lobe.
There is a smaller left pleural effusion with compressive
atelectasis in the left base.

Upper Abdomen: There is reflux of contrast into the inferior vena
cava and slightly into the hepatic veins. Visualized upper abdominal
structures otherwise appear unremarkable.

Musculoskeletal: There is elevation of the right hemidiaphragm.
There are no blastic or lytic bone lesions. No evident chest wall
lesions.

Review of the MIP images confirms the above findings.
IMPRESSION: 1. No demonstrable pulmonary embolus. No thoracic aortic aneurysm or
dissection.

2. Pleural effusions bilaterally, larger on the right than the left.
Compressive atelectasis in both lower lobes. Suspect associated
pneumonia within the right lower lobe consolidation.

3.  No adenopathy.

4. Reflux of contrast in the inferior vena cava and hepatic veins is
felt to be indicative of increased right heart pressure.

5.  Stable elevation the right hemidiaphragm.

## 2019-12-24 IMAGING — MR MR HEAD W/O CM
7 of 11 series · 25 of 48 positions shown · non-contrast
Comparison: Head CT [DATE]

CLINICAL DATA: Encephalopathy.

EXAM:
MRI HEAD WITHOUT CONTRAST
TECHNIQUE: Multiplanar, multiecho pulse sequences of the brain and surrounding
structures were obtained without intravenous contrast.

[Series 2: DWI · axial · 3.0mm · 0.94mm/px · z∈[-111,+42]mm · 7 of 104 slices shown (1 of 2)]
[im 1/104]
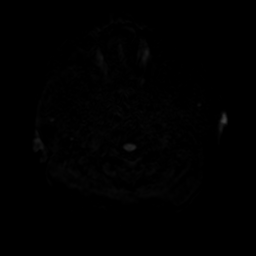
[im 18/104]
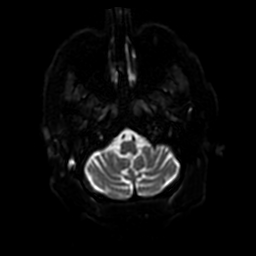
[im 35/104]
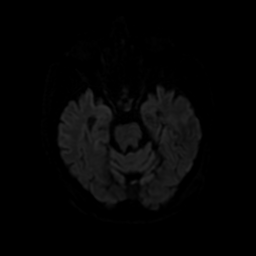
[im 52/104]
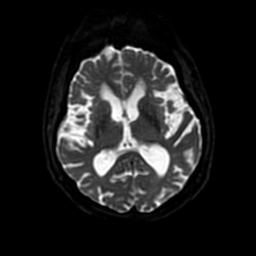
[im 69/104]
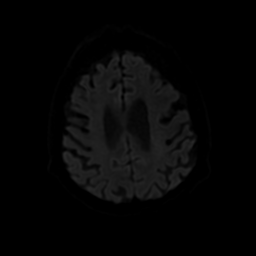
[im 86/104]
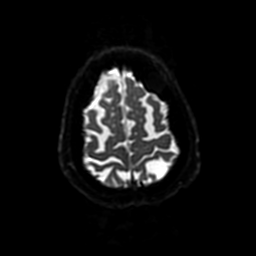
[im 104/104]
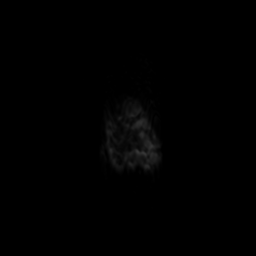

[Series 3: DWI · coronal · 4.0mm · 0.94mm/px · 6 of 72 slices shown (2 of 2)]
[im 1/72]
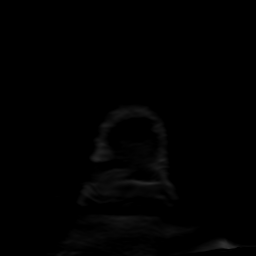
[im 15/72]
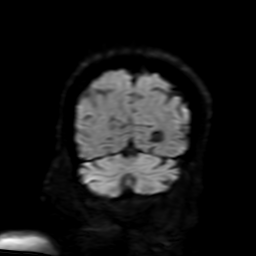
[im 29/72]
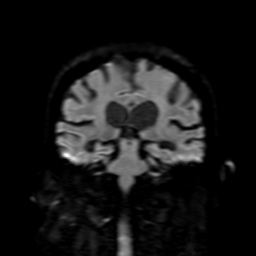
[im 43/72]
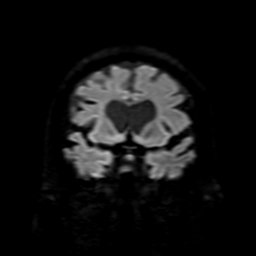
[im 57/72]
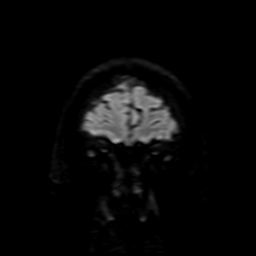
[im 72/72]
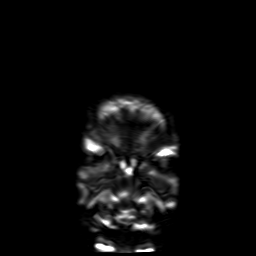

[Series 4: FLAIR · sagittal · 5.0mm · 0.23mm/px · 2 of 25 slices shown (1 of 2)]
[im 1/25]
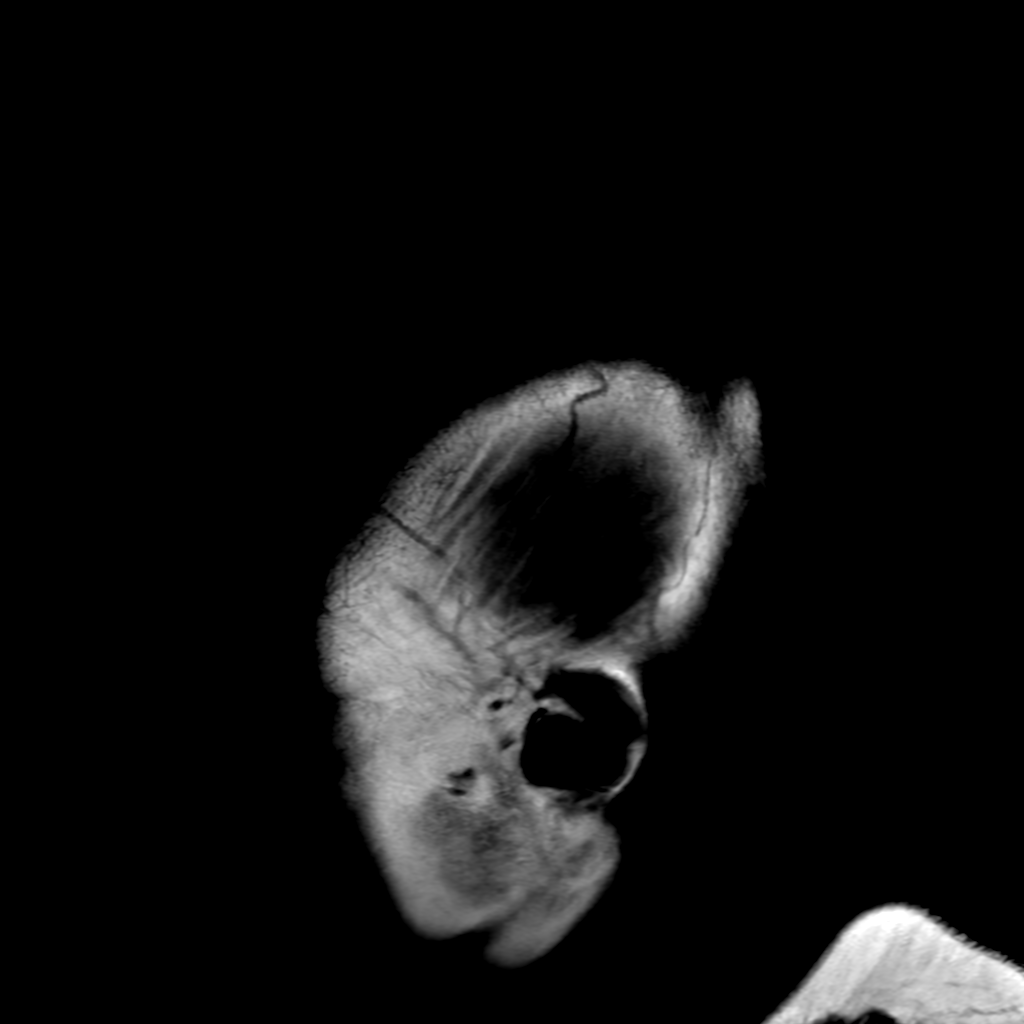
[im 25/25]
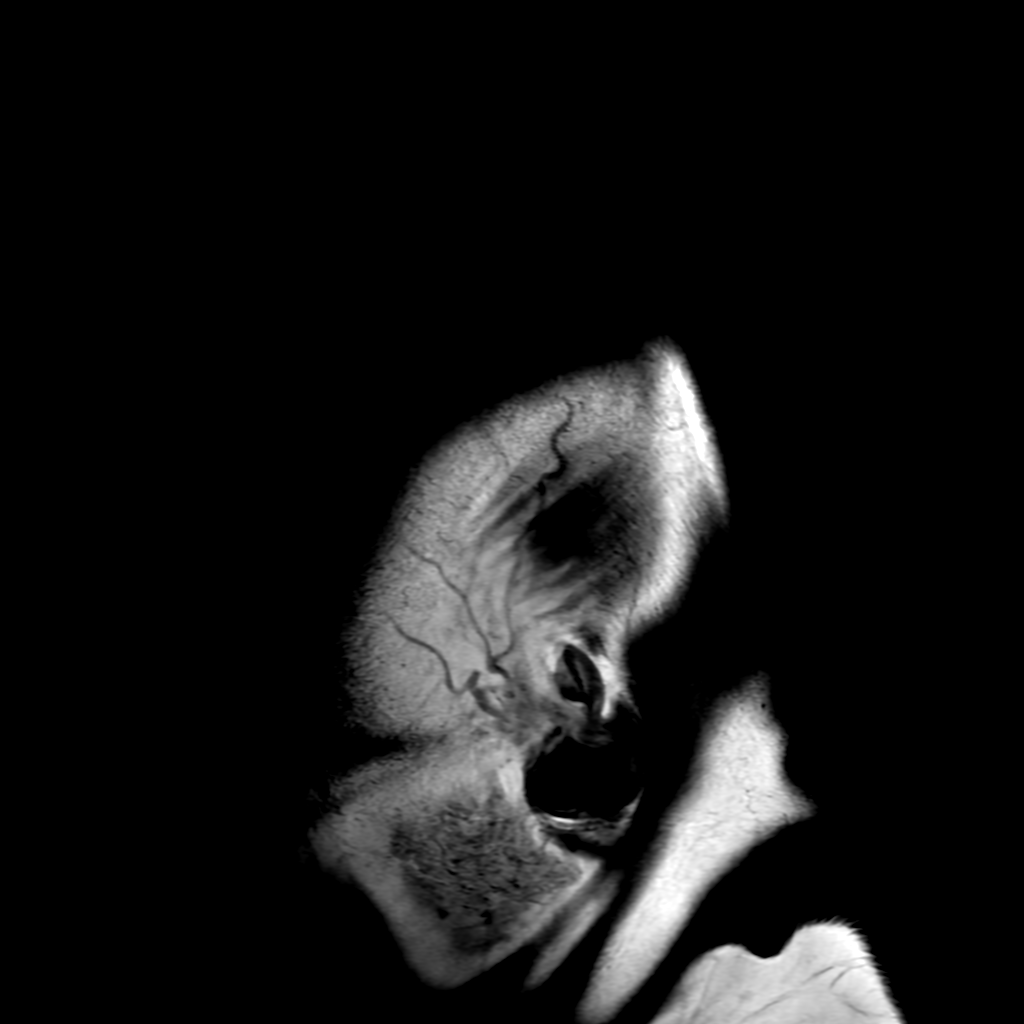

[Series 5: T2 · axial · 5.0mm · 0.23mm/px · 1 of 27 slices shown]
[im 1/27]
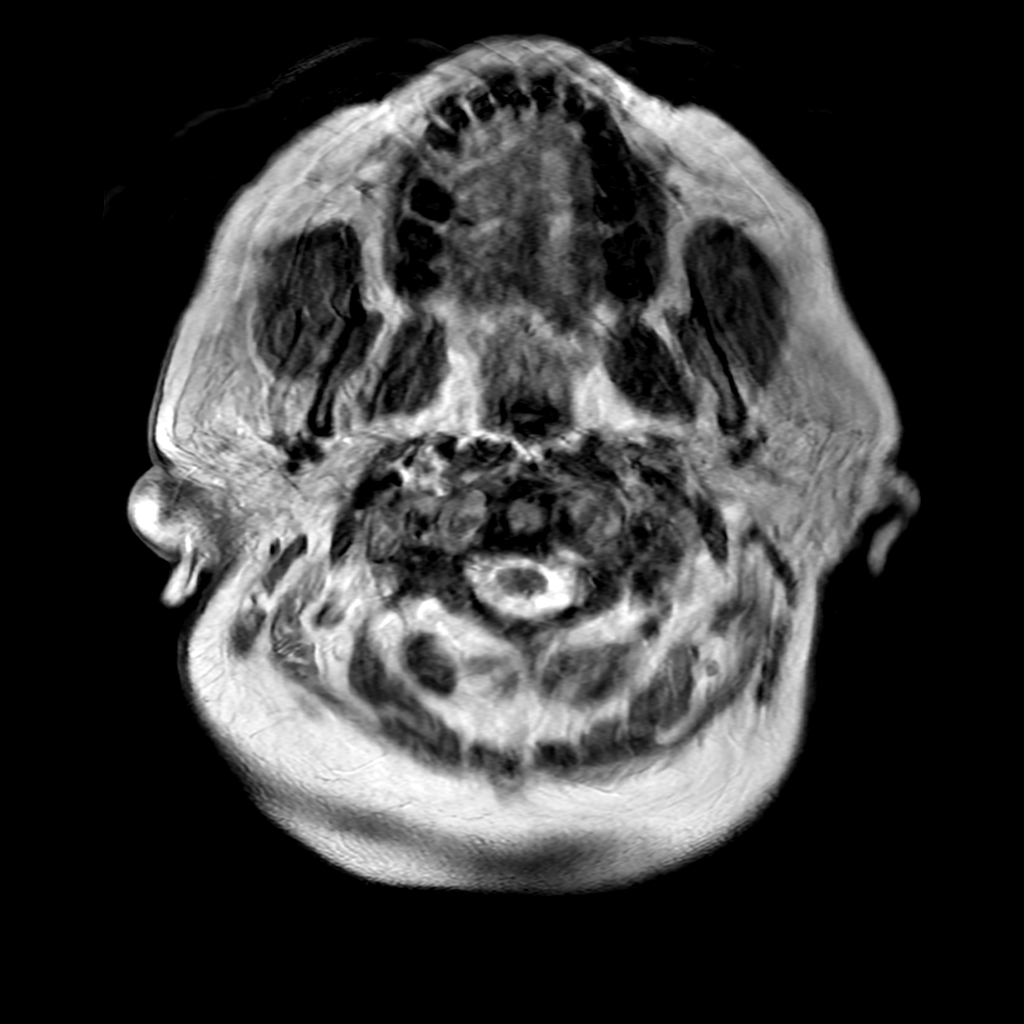

[Series 7: FLAIR · axial · 5.0mm · 0.47mm/px · z∈[-113,+43]mm · 2 of 27 slices shown (2 of 2)]
[im 1/27]
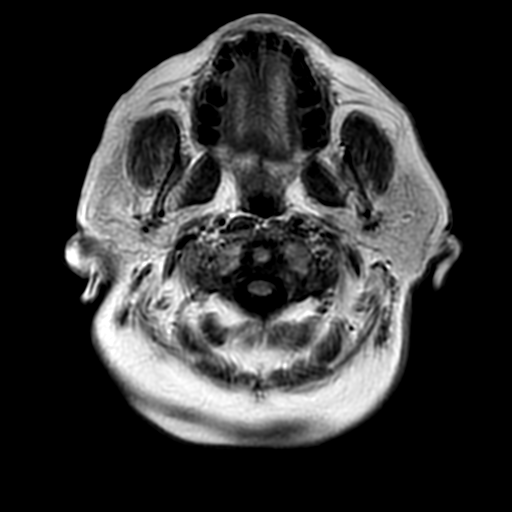
[im 27/27]
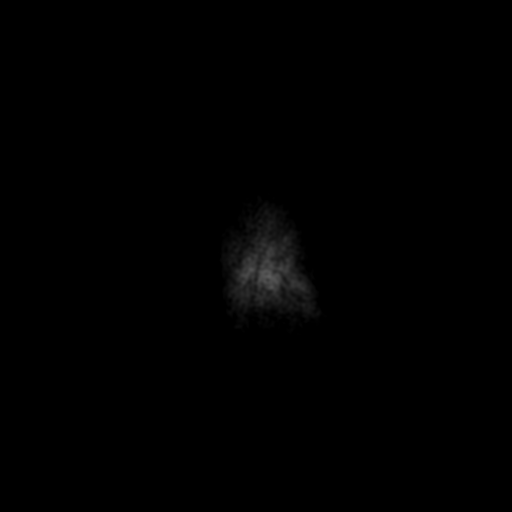

[Series 250: ADC · axial · 3.0mm · 0.94mm/px · z∈[-111,+42]mm · 4 of 52 slices shown (1 of 2)]
[im 1/52]
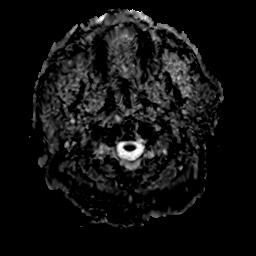
[im 18/52]
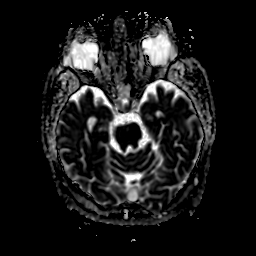
[im 35/52]
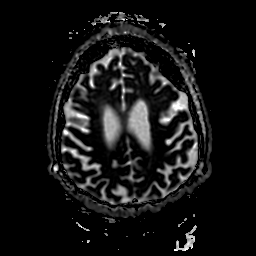
[im 52/52]
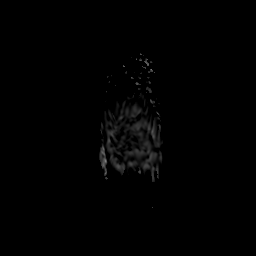

[Series 350: ADC · coronal · 4.0mm · 0.94mm/px · 3 of 36 slices shown (2 of 2)]
[im 1/36]
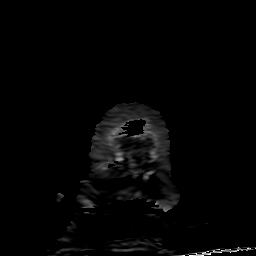
[im 18/36]
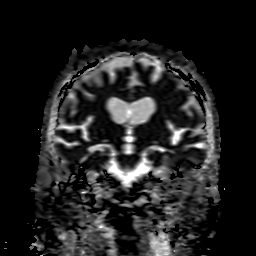
[im 36/36]
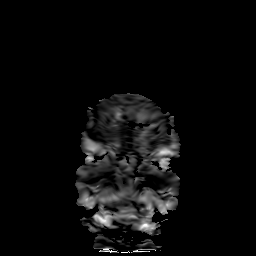

[25 of 48 positions shown; findings below may reference images not displayed]

FINDINGS: The study is partially degraded by motion

Brain: No acute infarction, hemorrhage, hydrocephalus, extra-axial
collection or mass lesion.

Scattered foci of T2 hyperintensity are seen within the white matter
of the cerebral hemispheres, predominantly periventricular,
nonspecific. There is prominence of the supratentorial ventricles,
cerebral and cerebellar sulci, reflecting parenchymal volume loss.

Vascular: Normal flow voids.

Skull and upper cervical spine: Normal marrow signal.

Sinuses/Orbits: Fluid level within the right sphenoid sinus mild
mucosal thickening of the bilateral ethmoid cells. The orbits are
maintained.

Other: Bilateral mastoid effusions, right greater than left.
IMPRESSION: 1. No acute intracranial abnormality.
2. Moderate to advanced parenchymal volume loss and mild chronic
microvascular ischemic changes.
3. Fluid level within the right sphenoid sinus. Correlate clinically
for acute sinusitis.
4. Bilateral mastoid effusions, right greater than left.

## 2019-12-24 MED ORDER — FUROSEMIDE 10 MG/ML IJ SOLN
20.0000 mg | Freq: Once | INTRAMUSCULAR | Status: AC
  Administered 2019-12-24: 20 mg via INTRAVENOUS
  Filled 2019-12-24: qty 2

## 2019-12-24 MED ORDER — ADULT MULTIVITAMIN W/MINERALS CH
1.0000 | ORAL_TABLET | Freq: Every day | ORAL | Status: DC
  Administered 2019-12-24 – 2020-01-06 (×12): 1 via ORAL
  Filled 2019-12-24 (×15): qty 1

## 2019-12-24 MED ORDER — VANCOMYCIN HCL IN DEXTROSE 1-5 GM/200ML-% IV SOLN
1000.0000 mg | Freq: Two times a day (BID) | INTRAVENOUS | Status: DC
  Administered 2019-12-24 – 2020-01-06 (×25): 1000 mg via INTRAVENOUS
  Filled 2019-12-24 (×31): qty 200

## 2019-12-24 MED ORDER — SODIUM CHLORIDE 0.9 % IV SOLN
INTRAVENOUS | Status: DC | PRN
  Administered 2019-12-24 – 2019-12-26 (×4): 1000 mL via INTRAVENOUS
  Administered 2019-12-29 – 2020-01-02 (×3): 250 mL via INTRAVENOUS

## 2019-12-24 MED ORDER — SODIUM CHLORIDE 0.9 % IV SOLN
INTRAVENOUS | Status: DC | PRN
  Administered 2019-12-24 – 2020-01-01 (×2): 250 mL via INTRAVENOUS

## 2019-12-24 MED ORDER — IOHEXOL 350 MG/ML SOLN
100.0000 mL | Freq: Once | INTRAVENOUS | Status: AC
  Administered 2019-12-24: 100 mL via INTRAVENOUS

## 2019-12-24 MED ORDER — ENSURE MAX PROTEIN PO LIQD
11.0000 [oz_av] | Freq: Every day | ORAL | Status: DC
  Administered 2019-12-24 – 2020-01-06 (×10): 11 [oz_av] via ORAL
  Filled 2019-12-24 (×15): qty 330

## 2019-12-24 NOTE — Plan of Care (Signed)

## 2019-12-24 NOTE — Progress Notes (Addendum)
PROGRESS NOTE    Becky Hendricks  ZOX:[id number] DOB: October 14, 1945 DOA: 12/20/2019 PCP: Roderic Scarce, MD   Chief Complaint  Patient presents with  . post cpr    Brief Narrative:  Becky Hendricks is a 74 yo F with h/o AF on Eliquis, bipolar disorder, HTN, T2DM, who presented after out of hospital cardiopulmonary arrest. Pt was recently discharged on 12/17/19 stage IV pressure ulcer to be on Unasyn + Vanc until 6/10. On 5/9, staff at the nursing home reported she didn't "look right" at lunch. She was later found in her room agonally breathing without a pulse. She underwent 1-2 minutes of CPR with ROSC. She was intubated in the field and brought to [facility] ED. In the ED, SpO2 100 intubated, BP 120s/90s on epi, blood glucose 300, lactate 6. WBC 11.3 from 10.8 on discharge. ABG was 7.42/64/272/42. Troponin 76 and 161 six hours later. Respiratory panel negative, EKG showed NSR without ischemic changes. She was minimally responsive despite no sedation; she was started on Levophed for BP support and transitioned to Meropenem and admitted to [facility].  Patient subsequently weaned off pressors.  Patient extubated.  Patient on IV antibiotics.  2D echo pending.  Patient transferred to hospitalist team on 12/23/2019.   Assessment & Plan:   Active Problems:   Pressure injury of skin   Respiratory failure with hypoxia (HCC)   Acute metabolic encephalopathy   Severe sepsis (HCC)   Osteomyelitis (HCC)   Abscess of multiple sites of buttock   AF (paroxysmal atrial fibrillation) (HCC)   Cardiac arrest (HCC)   Hyperphosphatemia  1 cardiopulmonary arrest Patient noted to have had a cardiopulmonary arrest on 12/20/2019 in the nursing home requiring 1 to 2 minutes of CPR before ROSC.  Working diagnosis felt likely secondary to septic shock secondary to stage IV decubitus ulcer with osteomyelitis of the coccyx/sacrum.  Concern for also PE.  Patient noted on admission to be hypotensive requiring pressors in the setting of  osteomyelitis.  Pressors have been weaned off.  It was felt unlikely secondary to PE as patient noted to be compliant with Eliquis.  Chest x-ray done did show some bibasilar infiltrate but repeat showed right pleural effusion not consistent with pneumonia.  Patient with recent 2D echo on 12/08/2019 with a EF of 60 to 65%, no wall motion abnormalities.  Patient with minimally elevated troponin felt consistent with demand ischemia.  Repeat 2D echo with moderately dilated right ventricle with moderately decreased systolic function and new moderate pulmonary hypertension.  EF 65 to 70%.  Left ventricle with normal function.  Grade 2 diastolic dysfunction.  Right ventricular systolic function is moderately reduced.  Right ventricular size moderately enlarged.  Moderately elevated pulmonary artery systolic pressure.  Left atrial size mildly dilated, right atrial size moderately dilated.  With abnormal 2D echo findings, presentation with hypotension and cardiopulmonary arrest we will check a CT angiogram chest to rule out PE.  Continue empiric IV antibiotics.  Follow.   2.  Severe sepsis with septic shock secondary to osteomyelitis of the sacrum/coccyx, stage IV pressure ulcer, POA Secondary to osteomyelitis of the sacrum/coccyx.  Patient noted to have a polymicrobial decubitus ulcer with strep viridans bacteremia noted last week.  Patient was treated on IV Unasyn and vancomycin which was discharged on.  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  Stable decubitus ulcer with osteomyelitis involving distal sacrum and coccyx.  Chest x-ray with some bibasilar  infiltrate however repeat chest x-ray showed a right pleural effusion.  Sepsis felt not likely due to a pulmonary infiltrate.  Patient pancultured, blood cultures with no growth to date.  Patient noted to have a midline prior to admission which was removed on 12/21/2019.  Lactate trending  down.  Patient currently off pressors as of 12/22/2019.  Improving clinically.  Continue empiric IV vancomycin IV Unasyn through 01/21/2020 as previously recommended.  Will need new PICC line versus midline prior to discharge once blood cultures have been finalized.  Follow.  3.  Acute metabolic encephalopathy Questionable etiology.  Felt could be secondary to patient's acute cardiopulmonary arrest versus acute CVA.  CT head done negative for any acute abnormalities.  EEG done on 12/21/2019 with moderate to severe diffuse encephalopathy, nonspecific etiology, no seizures or epileptiform discharges noted.  Patient alert to self.  Patient thinks she is in the nursing home.  Patient thinks is in the 67s.  Patient thinks it is January.  Patient however answering questions appropriately.  It was noted that patient did have encephalopathy versus chronic cognitive deficit on prior hospitalization (4/22-5/6) and may be patient's new baseline.  TSH noted to be 2.570.  Vitamin B12 at 363.  HIV nonreactive.  RPR nonreactive.  Patient with some right upper extremity weakness.  Check MRI head.  Supportive care.   4.  Stage IV decubitus pressure ulcer/osteomyelitis of the sacrum and coccyx, POA Patient discharged on 12/17/2019 on Unasyn and vancomycin through 01/21/2020.  Wound care consulted.  Continue empiric IV antibiotics.  Follow.  5.  Paroxysmal atrial fibrillation Currently rate controlled.  Eliquis for anticoagulation.  6.  Bipolar disorder Continue Depakote.  7.  Diabetes mellitus type 2 Hemoglobin A1c 7.8 12/03/2019.  CBG of 108 this morning.  Patient seen by speech therapist and currently on a regular diet.  Continue sliding scale insulin.  8.  Hyperphosphatemia Patient was started on calcium binders.  Repeat phosphorus at 3.0.  9.  Bilateral upper extremity edema Likely secondary to aggressive fluid hydration on presentation.  Patient is +5.5 L.  2D echo with a normal EF, grade 2 diastolic dysfunction.   We will give Lasix 20 mg IV x1.  Strict I's and O's.  Daily weights.  Follow.    DVT prophylaxis: Eliquis Code Status: Full Family Communication: Updated patient.  Updated brother on the telephone.   Disposition:   Status is: Inpatient    Dispo: The patient is from: SNF              Anticipated d/c is to: Likely SNF.  To be determined.              Anticipated d/c date is: To be determined.              Patient currently in the progressive care unit, recently off pressors, on empiric IV antibiotics, patient with right upper extremity weakness MRI pending, patient with abnormal 2D echo concerning for possible PE.  Patient not ready for discharge.        Consultants:   Admitted to PCCM    Procedures:  5/9: CT Head wo contrast: 1. No acute intracranial pathology. 2. Age-related atrophy and chronic microvascular ischemic changes.  5/9: CT Abd Pelvis w Contrast: 1. 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 2. Stable decubitus ulcer with osteomyelitis involving distal sacrum and coccyx. 3. Cholelithiasis. No radiographic evidence of cholecystitis. 4. Stable small uterine fibroids. 5. Increased small right pleural effusion  and bibasilar atelectasis.  5/9: CXR: 1. Left internal jugular catheter coiled over the left subclavian vein, with tip normally positioned over the superior vena cava. Please correlate with catheter function. 2. Increasing right basilar consolidation and/or effusion.  5/10 EEG: moderate to severe diffuse encephalopathy, nonspecific etiology.No seizures ordefiniteepileptiform discharges were seen throughout the recording.  2D echo 12/23/2019  12/20/2019 ETT>>>> 12/22/2019  CT angiogram chest pending 12/24/2019 MRI head pending 12/24/2019    Antimicrobials:  4/22 - 4/28: Vanc + Cefepime + lagyl  4/28 - 5/9: Vanc + Unasyn (to be continued until 01/21/20) 5/9 - 5/10: Vanc + Meropenem            5/11 -  present: Vanc + Unasyn   Micro Data:  BC x2 NGTD Urine culture NG  Respiratory panel: negative SARS-CoV-2/Influenza A/Influenza B    Subjective: .  Patient sitting up in chair.  Denies chest pain or shortness of breath.  Denies abdominal pain.  Alert to self only.  Thinks she is in [place].  Thinks is 1941.   Objective: Vitals:   12/24/19 0426 12/24/19 0427 12/24/19 0452 12/24/19 0738  BP: (!) 86/67  129/70 126/64  Pulse: 68  63 65  Resp: 15   17  Temp: 98.5 F (36.9 C)   (!) 97.4 F (36.3 C)  TempSrc: Oral   Oral  SpO2: 99%   100%  Weight:  100.3 kg    Height:        Intake/Output Summary (Last 24 hours) at 12/24/2019 0940 Last data filed at 12/24/2019 0814 Gross per 24 hour  Intake 870.12 ml  Output 1100 ml  Net -229.88 ml   Filed Weights   12/22/19 0500 12/23/19 0341 12/24/19 0427  Weight: 98.4 kg 100.3 kg 100.3 kg    Examination:  General exam: NAD.   Respiratory system: Decreased bibasilar crackles.  No rhonchi.  No wheezing. Cardiovascular system: Regular rate rhythm no murmurs rubs or gallops.  No JVD.  No lower extremity edema. Gastrointestinal system: Abdomen is soft, nontender, nondistended, positive bowel sounds.  No rebound.  No guarding.  Central nervous system: Alert and oriented to self only.  Right upper extremity weakness.  Extremities: Bilateral upper extremity swelling. RUE weakness. Skin: No rashes, lesions or ulcers Psychiatry: Judgement and insight appear poor to fair. Mood & affect appropriate.     Data Reviewed: I have personally reviewed following labs and imaging studies  CBC: Recent Labs  Lab 12/20/19 1543 12/20/19 1606 12/21/19 0409 12/21/19 0416 12/21/19 1718 12/22/19 0820 12/22/19 1256 12/23/19 0316 12/24/19 0612  WBC 11.3*  --  14.2*  --   --   --  11.7* 8.6 6.6  NEUTROABS 8.4*  --   --   --   --   --  8.6* 4.8 3.2  HGB 10.2*   < > 9.2*   < > 9.5* 9.2* 8.3* 8.5* 8.6*  HCT 36.0   < > 31.0*   < > 28.0* 27.0* 27.9*  29.0* 29.1*  MCV 98.1  --  95.1  --   --   --  95.9 97.6 98.3  PLT 244  --  207  --   --   --  151 128* 124*   < > = values in this interval not displayed.    Basic Metabolic Panel: Recent Labs  Lab 12/20/19 1920 12/20/19 2131 12/21/19 0409 12/21/19 0416 12/21/19 1931 12/21/19 1931 12/22/19 0329 12/22/19 0820 12/22/19 1809 12/23/19 0316 12/24/19 0612  NA 138   < >  140   < > 138   < > 138 136 139 137 140  K 2.6*   < > 3.9   < > 3.6   < > 3.6 4.0 5.0 4.6 4.3  CL 87*   < > 93*   < > 91*  --  94*  --  95* 95* 98  CO2 34*   < > 36*   < > 33*  --  34*  --  33* 36* 35*  GLUCOSE 194*   < > 99   < > 191*  --  202*  --  150* 141* 112*  BUN 23   < > 21   < > 20  --  18  --  11 9 5*  CREATININE 0.61   < > 0.53   < > 0.43*  --  0.35*  --  0.36* 0.39* 0.38*  CALCIUM 7.7*   < > 8.1*   < > 8.0*  --  7.8*  --  8.0* 7.8* 8.0*  MG 1.5*  --  2.4  --   --   --   --   --   --   --   --   PHOS  --   --  1.6*  --  5.3*  --   --   --   --   --  3.0  3.0   < > = values in this interval not displayed.    GFR: Estimated Creatinine Clearance: 73.2 mL/min (A) (by C-G formula based on SCr of 0.38 mg/dL (L)).  Liver Function Tests: Recent Labs  Lab 12/20/19 1543 12/23/19 0316 12/24/19 0612  AST 34 13*  --   ALT 19 18  --   ALKPHOS 77 58  --   BILITOT 0.7 0.7  --   PROT 5.2* 4.7*  --   ALBUMIN 1.9* 1.7* 1.7*    CBG: Recent Labs  Lab 12/23/19 0658 12/23/19 1102 12/23/19 1518 12/23/19 2135 12/24/19 0621  GLUCAP 122* 208* 126* 158* 108*     Recent Results (from the past 240 hour(s))  Respiratory Panel by RT PCR (Flu A&B, Covid) - Nasopharyngeal Swab     Status: None   Collection Time: 12/17/19  2:20 PM   Specimen: Nasopharyngeal Swab  Result Value Ref Range Status   SARS Coronavirus 2 by RT PCR NEGATIVE NEGATIVE Final    Comment: (NOTE) SARS-CoV-2 target nucleic acids are NOT DETECTED. The SARS-CoV-2 RNA is generally detectable in upper respiratoy specimens during the acute phase  of infection. The lowest concentration of SARS-CoV-2 viral copies this assay can detect is 131 copies/mL. A negative result does not preclude SARS-Cov-2 infection and should not be used as the sole basis for treatment or other patient management decisions. A negative result may occur with  improper specimen collection/handling, submission of specimen other than nasopharyngeal swab, presence of viral mutation(s) within the areas targeted by this assay, and inadequate number of viral copies (<131 copies/mL). A negative result must be combined with clinical observations, patient history, and epidemiological information. The expected result is Negative. Fact Sheet for Patients:  https://www.moore.com/ Fact Sheet for Healthcare Providers:  https://www.young.biz/ This test is not yet ap proved or cleared by the [place] FDA and  has been authorized for detection and/or diagnosis of SARS-CoV-2 by FDA under an Emergency Use Authorization (EUA). This EUA will remain  in effect (meaning this test can be used) for the duration of the COVID-19 declaration under Section 564(b)(1) of the Act,  21 U.S.C. section 360bbb-3(b)(1), unless the authorization is terminated or revoked sooner.    Influenza A by PCR NEGATIVE NEGATIVE Final   Influenza B by PCR NEGATIVE NEGATIVE Final    Comment: (NOTE) The Xpert Xpress SARS-CoV-2/FLU/RSV assay is intended as an aid in  the diagnosis of influenza from Nasopharyngeal swab specimens and  should not be used as a sole basis for treatment. Nasal washings and  aspirates are unacceptable for Xpert Xpress SARS-CoV-2/FLU/RSV  testing. Fact Sheet for Patients: https://www.moore.com/ Fact Sheet for Healthcare Providers: https://www.young.biz/ This test is not yet approved or cleared by the [place] FDA and  has been authorized for detection and/or diagnosis of SARS-CoV-2 by  FDA  under an Emergency Use Authorization (EUA). This EUA will remain  in effect (meaning this test can be used) for the duration of the  Covid-19 declaration under Section 564(b)(1) of the Act, 21  U.S.C. section 360bbb-3(b)(1), unless the authorization is  terminated or revoked. Performed at [facility], [address], [place], [place] [address]   Blood culture (routine x 2)     Status: None (Preliminary result)   Collection Time: 12/20/19  3:59 PM   Specimen: BLOOD  Result Value Ref Range Status   Specimen Description BLOOD RIGHT ANTECUBITAL  Final   Special Requests   Final    BOTTLES DRAWN AEROBIC AND ANAEROBIC Blood Culture adequate volume   Culture   Final    NO GROWTH 3 DAYS Performed at [facility], [address] [place], [place] [address]    Report Status PENDING  Incomplete  Respiratory Panel by RT PCR (Flu A&B, Covid) - Nasopharyngeal Swab     Status: None   Collection Time: 12/20/19  4:46 PM   Specimen: Nasopharyngeal Swab  Result Value Ref Range Status   SARS Coronavirus 2 by RT PCR NEGATIVE NEGATIVE Final    Comment: (NOTE) SARS-CoV-2 target nucleic acids are NOT DETECTED. The SARS-CoV-2 RNA is generally detectable in upper respiratoy specimens during the acute phase of infection. The lowest concentration of SARS-CoV-2 viral copies this assay can detect is 131 copies/mL. A negative result does not preclude SARS-Cov-2 infection and should not be used as the sole basis for treatment or other patient management decisions. A negative result may occur with  improper specimen collection/handling, submission of specimen other than nasopharyngeal swab, presence of viral mutation(s) within the areas targeted by this assay, and inadequate number of viral copies (<131 copies/mL). A negative result must be combined with clinical observations, patient history, and epidemiological information. The expected result is Negative. Fact Sheet for Patients:    https://www.moore.com/ Fact Sheet for Healthcare Providers:  https://www.young.biz/ This test is not yet ap proved or cleared by the [place] FDA and  has been authorized for detection and/or diagnosis of SARS-CoV-2 by FDA under an Emergency Use Authorization (EUA). This EUA will remain  in effect (meaning this test can be used) for the duration of the COVID-19 declaration under Section 564(b)(1) of the Act, 21 U.S.C. section 360bbb-3(b)(1), unless the authorization is terminated or revoked sooner.    Influenza A by PCR NEGATIVE NEGATIVE Final   Influenza B by PCR NEGATIVE NEGATIVE Final    Comment: (NOTE) The Xpert Xpress SARS-CoV-2/FLU/RSV assay is intended as an aid in  the diagnosis of influenza from Nasopharyngeal swab specimens and  should not be used as a sole basis for treatment. Nasal washings and  aspirates are unacceptable for Xpert Xpress SARS-CoV-2/FLU/RSV  testing. Fact Sheet  for Patients: PinkCheek.be Fact Sheet for Healthcare Providers: GravelBags.it This test is not yet approved or cleared by the [place] FDA and  has been authorized for detection and/or diagnosis of SARS-CoV-2 by  FDA under an Emergency Use Authorization (EUA). This EUA will remain  in effect (meaning this test can be used) for the duration of the  Covid-19 declaration under Section 564(b)(1) of the Act, 21  U.S.C. section 360bbb-3(b)(1), unless the authorization is  terminated or revoked. Performed at [facility], [place] [address] [place], [place] [address]   Urine Culture     Status: None   Collection Time: 12/20/19  4:53 PM   Specimen: Urine, Random  Result Value Ref Range Status   Specimen Description URINE, RANDOM  Final   Special Requests NONE  Final   Culture   Final    NO GROWTH Performed at [facility], [place] [address] [place], [place] [address]    Report Status  12/21/2019 FINAL  Final  Blood culture (routine x 2)     Status: None (Preliminary result)   Collection Time: 12/20/19  5:21 PM   Specimen: BLOOD RIGHT HAND  Result Value Ref Range Status   Specimen Description BLOOD RIGHT HAND  Final   Special Requests   Final    BOTTLES DRAWN AEROBIC AND ANAEROBIC Blood Culture adequate volume   Culture   Final    NO GROWTH 3 DAYS Performed at [facility], [place] [address], [place], [place] [address]    Report Status PENDING  Incomplete         Radiology Studies: ECHOCARDIOGRAM COMPLETE  Result Date: 12/23/2019    ECHOCARDIOGRAM REPORT   Patient Name:   REDINA ZELLER Date of Exam: 12/23/2019 Medical Rec #:  [id number]      Height:       64.0 in Accession #:    [id number]     Weight:       221.1 lb Date of Birth:  December 05, 1945       BSA:          2.042 m Patient Age:    63 years       BP:           133/59 mmHg Patient Gender: F              HR:           66 bpm. Exam Location:  Inpatient Procedure: 2D Echo Indications:    cardiopulmonary arrest  History:        Patient has prior history of Echocardiogram examinations, most                 recent 12/08/2019. Arrythmias:Atrial Fibrillation; Risk                 Factors:Diabetes and Hypertension.  Sonographer:    Jannett Celestine RDCS (AE) Referring Phys: [id number] Eugenie Filler  Sonographer Comments: limited mobility. off axis apical windows IMPRESSIONS  1. Since the last study on 12/08/2019 LVEF hasn't changes. Right ventricle is now moderately dilated with moderately decreased systolic function and new moderate pulmonary hypertension. Consider pulmonary embolism.  2. Left ventricular ejection fraction, by estimation, is 65 to 70%. The left ventricle has normal function. The left ventricle has no regional wall motion abnormalities. Left ventricular diastolic parameters are consistent with Grade II diastolic dysfunction (pseudonormalization).  3. Right ventricular systolic function is moderately reduced. The right  ventricular size is moderately enlarged. There  is moderately elevated pulmonary artery systolic pressure. The estimated right ventricular systolic pressure is 59.6 mmHg.  4. Left atrial size was mildly dilated.  5. Right atrial size was moderately dilated.  6. The mitral valve is normal in structure. No evidence of mitral valve regurgitation. No evidence of mitral stenosis.  7. The aortic valve is normal in structure. Aortic valve regurgitation is not visualized. Mild to moderate aortic valve sclerosis/calcification is present, without any evidence of aortic stenosis.  8. The inferior vena cava is dilated in size with <50% respiratory variability, suggesting right atrial pressure of 15 mmHg. FINDINGS  Left Ventricle: Left ventricular ejection fraction, by estimation, is 65 to 70%. The left ventricle has normal function. The left ventricle has no regional wall motion abnormalities. The left ventricular internal cavity size was normal in size. There is  no left ventricular hypertrophy. Left ventricular diastolic parameters are consistent with Grade II diastolic dysfunction (pseudonormalization). Normal left ventricular filling pressure. Right Ventricle: The right ventricular size is moderately enlarged. No increase in right ventricular wall thickness. Right ventricular systolic function is moderately reduced. There is moderately elevated pulmonary artery systolic pressure. The tricuspid  regurgitant velocity is 3.34 m/s, and with an assumed right atrial pressure of 15 mmHg, the estimated right ventricular systolic pressure is 59.6 mmHg. Left Atrium: Left atrial size was mildly dilated. Right Atrium: Right atrial size was moderately dilated. Pericardium: There is no evidence of pericardial effusion. Mitral Valve: The mitral valve is normal in structure. Normal mobility of the mitral valve leaflets. No evidence of mitral valve regurgitation. No evidence of mitral valve stenosis. Tricuspid Valve: The tricuspid valve is  normal in structure. Tricuspid valve regurgitation is mild . No evidence of tricuspid stenosis. Aortic Valve: The aortic valve is normal in structure. Aortic valve regurgitation is not visualized. Mild to moderate aortic valve sclerosis/calcification is present, without any evidence of aortic stenosis. Pulmonic Valve: The pulmonic valve was normal in structure. Pulmonic valve regurgitation is not visualized. No evidence of pulmonic stenosis. Aorta: The aortic root is normal in size and structure. Venous: The inferior vena cava is dilated in size with less than 50% respiratory variability, suggesting right atrial pressure of 15 mmHg. IAS/Shunts: No atrial level shunt detected by color flow Doppler.  LEFT VENTRICLE PLAX 2D LVIDd:         5.00 cm  Diastology LVIDs:         3.00 cm  LV e' lateral:   8.05 cm/s LV PW:         1.00 cm  LV E/e' lateral: 10.6 LV IVS:        1.00 cm  LV e' medial:    8.70 cm/s LVOT diam:     2.00 cm  LV E/e' medial:  9.8 LV SV:         54 LV SV Index:   26 LVOT Area:     3.14 cm  RIGHT VENTRICLE RV S prime:     10.00 cm/s TAPSE (M-mode): 2.0 cm LEFT ATRIUM           Index       RIGHT ATRIUM           Index LA diam:      4.40 cm 2.16 cm/m  RA Area:     17.20 cm LA Vol (A4C): 30.1 ml 14.74 ml/m RA Volume:   43.10 ml  21.11 ml/m  AORTIC VALVE LVOT Vmax:   81.60 cm/s LVOT Vmean:  50.700 cm/s LVOT VTI:  0.172 m  AORTA Ao Root diam: 2.80 cm MITRAL VALVE               TRICUSPID VALVE MV Area (PHT): 3.91 cm    TR Peak grad:   44.6 mmHg MV Decel Time: 194 msec    TR Vmax:        334.00 cm/s MV E velocity: 85.30 cm/s MV A velocity: 84.40 cm/s  SHUNTS MV E/A ratio:  1.01        Systemic VTI:  0.17 m                            Systemic Diam: 2.00 cm Tobias Alexander MD Electronically signed by Tobias Alexander MD Signature Date/Time: 12/23/2019/1:25:23 PM    Final         Scheduled Meds: . apixaban  5 mg Oral BID  . atorvastatin  10 mg Oral Daily  . divalproex  500 mg Oral BID  . insulin  aspart  0-20 Units Subcutaneous TID WC  . mouth rinse  15 mL Mouth Rinse BID  . multivitamin with minerals  1 tablet Oral Daily  . mupirocin ointment  1 application Nasal BID  . Ensure Max Protein  11 oz Oral Daily  . senna-docusate  1 tablet Oral Daily   Continuous Infusions: . ampicillin-sulbactam (UNASYN) IV 3 g (12/24/19 0935)  . vancomycin 1,000 mg (12/23/19 2106)     LOS: 4 days    Time spent: 45 minutes    Ramiro Harvest, MD [facility]   To contact the attending provider between 7A-7P or the covering provider during after hours 7P-7A, please log into the web site [email or web address] and access using universal [place] password for that web site. If you do not have the password, please call the hospital operator.  12/24/2019, 9:40 AM

## 2019-12-24 NOTE — Progress Notes (Signed)
Nutrition Follow-up  DOCUMENTATION CODES:   Obesity unspecified  INTERVENTION:   -MVI with minerals daily -Magic cup BID with meals, each supplement provides 290 kcal and 9 grams of protein -Ensure Max po daily, each supplement provides 150 kcal and 30 grams of protein.   NUTRITION DIAGNOSIS:   Increased nutrient needs related to wound healing as evidenced by estimated needs.  Ongoing  GOAL:   Patient will meet greater than or equal to 90% of their needs  Progressing   MONITOR:   PO intake, Supplement acceptance, Labs, Weight trends, Skin, I & O's  REASON FOR ASSESSMENT:   Ventilator, Consult Enteral/tube feeding initiation and management  ASSESSMENT:   74 yo female admitted with septic shock, required brief CPR at SNF, intubated by EMS. PMH includes A fib, bipolar D/O, C diff colitis, HTN, obesity, DM-2, stage 4 pressure injury, L buttock abscess.  5/11- extubated 5/12- s/p BSE- advanced to regular diet with thin liquids  Reviewed I/O's: -350 ml x 24 hours and +5.8 L since admission  UOP: 1.2 L x 24 hours  Pt transferred to PCU from ICU on 12/23/19. Pt with good appetite and tolerance of regular diet. Noted meal completion 50-100%.   Due to increased nutritional needs to support wound healing, pt would greatly benefit from addition of oral nutrition supplements.  Labs reviewed: CBGS: 126-158 (inpatient orders for glycemic control are 0-20 units insulin aspart TID with meals).   Diet Order:   Diet Order            Diet Carb Modified Fluid consistency: Thin; Room service appropriate? 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  Estimated Nutritional Needs:   Kcal:  1950-2150  Protein:  110-125 grams  Fluid:  > 1.9 L    12/26/19, RD, LDN, CDCES Registered Dietitian II Certified Diabetes Care and Education Specialist Please refer to [facility] for RD and/or RD on-call/weekend/after hours pager

## 2019-12-24 NOTE — TOC Initial Note (Addendum)
Transition of Care [facility]) - Initial/Assessment Note    Patient Details  Name: Becky Hendricks MRN: [id number] Date of Birth: [birth year]  Transition of Care [facility]) CM/SW Contact:    Gildardo Griffes, LCSW Phone Number: 12/24/2019, 9:50 AM  Clinical Narrative:                  CSW spoke with patient's brother [name] who states dc plan is for patient to return to [place].   Sheliah Hatch has been informed of dc plan. Insurance auth started today with [facility] - Reference # [id number]. Patient will need updated COVID once closed to medical readiness to dc to SNF.  Expected Discharge Plan: Skilled Nursing Facility Barriers to Discharge: Continued Medical Work up   Patient Goals and CMS Choice   CMS Medicare.gov Compare Post Acute Care list provided to:: Patient Represenative (must comment)(brother [name]) Choice offered to / list presented to : Sibling  Expected Discharge Plan and Services Expected Discharge Plan: Skilled Nursing Facility     Post Acute Care Choice: Skilled Nursing Facility Living arrangements for the past 2 months: Skilled Nursing Facility(just at [facility])                                      Prior Living Arrangements/Services Living arrangements for the past 2 months: Skilled Nursing Facility(just at [employer]) Lives with:: Self Patient language and need for interpreter reviewed:: Yes Do you feel safe going back to the place where you live?: Yes      Need for Family Participation in Patient Care: Yes (Comment) Care giver support system in place?: Yes (comment)   Criminal Activity/Legal Involvement Pertinent to Current Situation/Hospitalization: No - Comment as needed  Activities of Daily Living      Permission Sought/Granted Permission sought to share information with : Facility Medical sales representative, Sports coach, Family Supports Permission granted to share information with : Yes, Verbal Permission Granted  Share Information with NAME: [name]  Permission granted to  share info w AGENCY: SNFs  Permission granted to share info w Relationship: brother  Permission granted to share info w Contact Information: [phone]  Emotional Assessment Appearance:: Appears stated age            Admission diagnosis:  Cardiac arrest (HCC) [I46.9] Encounter for central line placement [Z45.2] Severe sepsis (HCC) [A41.9, R65.20] Acute respiratory failure, unspecified whether with hypoxia or hypercapnia (HCC) [J96.00] Patient Active Problem List   Diagnosis Date Noted  . Hyperphosphatemia   . Cardiac arrest (HCC)   . Encounter for central line placement   . Acute respiratory failure (HCC)   . Sacral wound   . Hypoxia   . Severe sepsis (HCC) 12/03/2019  . Osteomyelitis (HCC) 12/03/2019  . Abscess of multiple sites of buttock 12/03/2019  . AF (paroxysmal atrial fibrillation) (HCC) 12/03/2019  . Respiratory failure with hypoxia (HCC) 09/15/2019  . Pneumonia due to COVID-19 virus 09/15/2019  . Acute metabolic encephalopathy 09/15/2019  . UTI (urinary tract infection), bacterial 09/15/2019  . Pressure injury of skin 09/10/2019  . AKI (acute kidney injury) (HCC)   . Acute respiratory distress syndrome (ARDS) due to COVID-19 virus (HCC) 09/07/2019   PCP:  Roderic Scarce, MD Pharmacy:   [facility], [place] - [address] [place] [address] Phone: [phone] Fax: [phone]     Social Determinants of Health (SDOH) Interventions    Readmission Risk  Interventions No flowsheet data found.

## 2019-12-24 NOTE — NC FL2 (Signed)
[place] MEDICAID FL2 LEVEL OF CARE SCREENING TOOL     IDENTIFICATION  Patient Name: Becky Hendricks Birthdate: 1945-08-27 Sex: female Admission Date (Current Location): 12/20/2019  [facility] and [place] Number:  Producer, television/film/video and Address:  [facility], [address], [place], [place] [address]      Provider Number: [id number]  Attending Physician Name and Address:  Rodolph Bong, MD  Relative Name and Phone Number:  [name] (brother) [phone]    Current Level of Care: Hospital Recommended Level of Care: Skilled Nursing Facility Prior Approval Number:    Date Approved/Denied:   PASRR Number: [id number]  Discharge Plan: SNF    Current Diagnoses: Patient Active Problem List   Diagnosis Date Noted  . Hyperphosphatemia   . Cardiac arrest (HCC)   . Encounter for central line placement   . Acute respiratory failure (HCC)   . Sacral wound   . Hypoxia   . Severe sepsis (HCC) 12/03/2019  . Osteomyelitis (HCC) 12/03/2019  . Abscess of multiple sites of buttock 12/03/2019  . AF (paroxysmal atrial fibrillation) (HCC) 12/03/2019  . Respiratory failure with hypoxia (HCC) 09/15/2019  . Pneumonia due to COVID-19 virus 09/15/2019  . Acute metabolic encephalopathy 09/15/2019  . UTI (urinary tract infection), bacterial 09/15/2019  . Pressure injury of skin 09/10/2019  . AKI (acute kidney injury) (HCC)   . Acute respiratory distress syndrome (ARDS) due to COVID-19 virus (HCC) 09/07/2019    Orientation RESPIRATION BLADDER Height & Weight     Self  O2(2L nasal cannula) Incontinent, External catheter Weight: 221 lb 1.9 oz (100.3 kg) Height:  5\' 4"  (162.6 cm)  BEHAVIORAL SYMPTOMS/MOOD NEUROLOGICAL BOWEL NUTRITION STATUS      Incontinent Diet(see discharge summary)  AMBULATORY STATUS COMMUNICATION OF NEEDS Skin   Total Care Verbally Other (Comment)(knee abrasions and skin tears, heel cracking, MASD buttocks, Pressury injury stage 4 buttocks)                        Personal Care Assistance Level of Assistance  Bathing, Feeding, Dressing, Total care Bathing Assistance: Maximum assistance Feeding assistance: Maximum assistance Dressing Assistance: Maximum assistance Total Care Assistance: Maximum assistance   Functional Limitations Info  Sight, Hearing, Speech Sight Info: Adequate Hearing Info: Adequate Speech Info: Adequate    SPECIAL CARE FACTORS FREQUENCY  PT (By licensed PT), OT (By licensed OT)     PT Frequency: min 5x weekly OT Frequency: min 5x weekly            Contractures Contractures Info: Not present    Additional Factors Info  Code Status, Allergies Code Status Info: full Allergies Info: Chlorhexidine Gluconate           Current Medications (12/24/2019):  This is the current hospital active medication list Current Facility-Administered Medications  Medication Dose Route Frequency Provider Last Rate Last Admin  . Ampicillin-Sulbactam (UNASYN) 3 g in sodium chloride 0.9 % 100 mL IVPB  3 g Intravenous Q6H 12/26/2019 B, MD 200 mL/hr at 12/24/19 0935 3 g at 12/24/19 0935  . apixaban (ELIQUIS) tablet 5 mg  5 mg Oral BID 12/26/19 B, MD   5 mg at 12/24/19 0933  . atorvastatin (LIPITOR) tablet 10 mg  10 mg Oral Daily 12/26/19, MD   10 mg at 12/24/19 0933  . divalproex (DEPAKOTE) DR tablet 500 mg  500 mg Oral BID 12/26/19 B, MD   500 mg at 12/24/19 0934  .  docusate sodium (COLACE) capsule 100 mg  100 mg Oral BID PRN Simonne Maffucci B, MD      . fentaNYL (SUBLIMAZE) injection 25-50 mcg  25-50 mcg Intravenous Q2H PRN Juanito Doom, MD   50 mcg at 12/22/19 1613  . furosemide (LASIX) injection 20 mg  20 mg Intravenous Once Eugenie Filler, MD      . insulin aspart (novoLOG) injection 0-20 Units  0-20 Units Subcutaneous TID WC Juanito Doom, MD   3 Units at 12/23/19 1704  . ipratropium-albuterol (DUONEB) 0.5-2.5 (3) MG/3ML nebulizer solution 3 mL  3 mL  Nebulization Q6H PRN Simonne Maffucci B, MD      . MEDLINE mouth rinse  15 mL Mouth Rinse BID Simonne Maffucci B, MD   15 mL at 12/24/19 0254  . multivitamin with minerals tablet 1 tablet  1 tablet Oral Daily Eugenie Filler, MD   1 tablet at 12/24/19 0933  . mupirocin ointment (BACTROBAN) 2 % 1 application  1 application Nasal BID Juanito Doom, MD   1 application at 27/06/23 [phone]  . polyethylene glycol (MIRALAX / GLYCOLAX) packet 17 g  17 g Oral Daily PRN Simonne Maffucci B, MD      . protein supplement (ENSURE MAX) liquid  11 oz Oral Daily Eugenie Filler, MD   11 oz at 12/24/19 0947  . senna-docusate (Senokot-S) tablet 1 tablet  1 tablet Oral Daily Juanito Doom, MD   1 tablet at 12/24/19 0933  . vancomycin (VANCOCIN) IVPB 1000 mg/200 mL premix  1,000 mg Intravenous Q12H Simonne Maffucci B, MD 200 mL/hr at 12/23/19 2106 1,000 mg at 12/23/19 2106     Discharge Medications: Please see discharge summary for a list of discharge medications.  Relevant Imaging Results:  Relevant Lab Results:   Additional Information SSN: [id number]  Alberteen Sam, LCSW

## 2019-12-25 ENCOUNTER — Encounter (HOSPITAL_BASED_OUTPATIENT_CLINIC_OR_DEPARTMENT_OTHER): Payer: Medicare Other | Admitting: Internal Medicine

## 2019-12-25 ENCOUNTER — Encounter (HOSPITAL_COMMUNITY): Payer: Self-pay | Admitting: Pulmonary Disease

## 2019-12-25 ENCOUNTER — Telehealth: Payer: Medicare Other | Admitting: Infectious Diseases

## 2019-12-25 LAB — CULTURE, BLOOD (ROUTINE X 2)
Culture: NO GROWTH
Culture: NO GROWTH
Special Requests: ADEQUATE
Special Requests: ADEQUATE

## 2019-12-25 LAB — CBC WITH DIFFERENTIAL/PLATELET
Abs Immature Granulocytes: 0.05 10*3/uL (ref 0.00–0.07)
Basophils Absolute: 0 10*3/uL (ref 0.0–0.1)
Basophils Relative: 0 %
Eosinophils Absolute: 0.3 10*3/uL (ref 0.0–0.5)
Eosinophils Relative: 5 %
HCT: 28.4 % — ABNORMAL LOW (ref 36.0–46.0)
Hemoglobin: 8.1 g/dL — ABNORMAL LOW (ref 12.0–15.0)
Immature Granulocytes: 1 %
Lymphocytes Relative: 35 %
Lymphs Abs: 1.9 10*3/uL (ref 0.7–4.0)
MCH: 28.1 pg (ref 26.0–34.0)
MCHC: 28.5 g/dL — ABNORMAL LOW (ref 30.0–36.0)
MCV: 98.6 fL (ref 80.0–100.0)
Monocytes Absolute: 0.8 10*3/uL (ref 0.1–1.0)
Monocytes Relative: 15 %
Neutro Abs: 2.5 10*3/uL (ref 1.7–7.7)
Neutrophils Relative %: 44 %
Platelets: 128 10*3/uL — ABNORMAL LOW (ref 150–400)
RBC: 2.88 MIL/uL — ABNORMAL LOW (ref 3.87–5.11)
RDW: 20.1 % — ABNORMAL HIGH (ref 11.5–15.5)
WBC: 5.6 10*3/uL (ref 4.0–10.5)
nRBC: 0 % (ref 0.0–0.2)

## 2019-12-25 LAB — MAGNESIUM: Magnesium: 1.5 mg/dL — ABNORMAL LOW (ref 1.7–2.4)

## 2019-12-25 LAB — BASIC METABOLIC PANEL
Anion gap: 8 (ref 5–15)
BUN: 7 mg/dL — ABNORMAL LOW (ref 8–23)
CO2: 35 mmol/L — ABNORMAL HIGH (ref 22–32)
Calcium: 8 mg/dL — ABNORMAL LOW (ref 8.9–10.3)
Chloride: 96 mmol/L — ABNORMAL LOW (ref 98–111)
Creatinine, Ser: 0.41 mg/dL — ABNORMAL LOW (ref 0.44–1.00)
GFR calc Af Amer: >60 mL/min (ref >60)
GFR calc non Af Amer: >60 mL/min (ref >60)
Glucose, Bld: 148 mg/dL — ABNORMAL HIGH (ref 70–99)
Potassium: 3.7 mmol/L (ref 3.5–5.1)
Sodium: 139 mmol/L (ref 135–145)

## 2019-12-25 LAB — GLUCOSE, CAPILLARY
Glucose-Capillary: 111 mg/dL — ABNORMAL HIGH (ref 70–99)
Glucose-Capillary: 120 mg/dL — ABNORMAL HIGH (ref 70–99)
Glucose-Capillary: 100 mg/dL — ABNORMAL HIGH (ref 70–99)
Glucose-Capillary: 260 mg/dL — ABNORMAL HIGH (ref 70–99)
Glucose-Capillary: 190 mg/dL — ABNORMAL HIGH (ref 70–99)

## 2019-12-25 MED ORDER — MAGNESIUM SULFATE 4 GM/100ML IV SOLN
4.0000 g | Freq: Once | INTRAVENOUS | Status: AC
  Administered 2019-12-25: 4 g via INTRAVENOUS
  Filled 2019-12-25: qty 100

## 2019-12-25 MED ORDER — FUROSEMIDE 10 MG/ML IJ SOLN
20.0000 mg | Freq: Two times a day (BID) | INTRAMUSCULAR | Status: AC
  Administered 2019-12-25 (×2): 20 mg via INTRAVENOUS
  Filled 2019-12-25 (×2): qty 2

## 2019-12-25 NOTE — Progress Notes (Signed)
Physical Therapy Treatment Patient Details Name: Becky Hendricks MRN: [id number] DOB: June 02, 1946 Today's Date: 12/25/2019    History of Present Illness 74 Y/o with PMH of afib, bipolar, HTN, Obesity, DM, Pressure ulcer with recent admission for left buttock abscess s.p drainage brought to ED after arrest requiring brief CPR at nursing home.  pt was intubated in the field.  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  Pt extubated 5/11.    PT Comments    Pt admitted with above diagnosis. Pt was unable to participate physically other than some UE exercises. Pt total assist to be cleaned and positioned.  Will continue trial of PT.  Pt currently with functional limitations due to weakness and endurance deficits. Pt will benefit from skilled PT to increase their independence and safety with mobility to allow discharge to the venue listed below.     Follow Up Recommendations  SNF     Equipment Recommendations  None recommended by PT    Recommendations for Other Services       Precautions / Restrictions Precautions Precautions: Fall Precaution Comments: sacral wound, stage IV with osteo Restrictions Weight Bearing Restrictions: No    Mobility  Bed Mobility Overal bed mobility: Needs Assistance Bed Mobility: Sit to Supine;Supine to Sit Rolling: Total assist;+2 for physical assistance;+2 for safety/equipment Sidelying to sit: Total assist;+2 for physical assistance;+2 for safety/equipment Supine to sit: Total assist;+2 for physical assistance Sit to supine: Total assist;+2 for physical assistance   General bed mobility comments: pt did not initiate movement to command or assist therapist once assist give to patient.  Attempted to get pt to push with L UE with hand over hand assist without success.  Use of pad to help scoot to EOB.  Pt was saturated with urine and had to clean her.  Purewick had leaked. Nurse  made aware.  positioned pt well and asked nurse to come and assist pt with meal.   Transfers                 General transfer comment: not attempted; Will need Maximove  Ambulation/Gait             General Gait Details: unable this day   Stairs             Wheelchair Mobility    Modified Rankin (Stroke Patients Only)       Balance       Sitting balance - Comments: Spent session cleaning and positionnig pt.        Standing balance comment: unable                            Cognition Arousal/Alertness: Awake/alert Behavior During Therapy: Flat affect Overall Cognitive Status: No family/caregiver present to determine baseline cognitive functioning Area of Impairment: Orientation;Attention;Memory;Following commands;Safety/judgement;Awareness;Problem solving                 Orientation Level: Disoriented to;Place;Time;Situation Current Attention Level: Sustained Memory: Decreased recall of precautions;Decreased short-term memory Following Commands: Follows one step commands inconsistently;Follows one step commands with increased time Safety/Judgement: Decreased awareness of safety;Decreased awareness of deficits Awareness: Intellectual Problem Solving: Slow processing;Decreased initiation;Difficulty sequencing;Requires verbal cues;Requires tactile cues General Comments: Pt with minimal responsiveness/engagement. Answered a few yes/no questions with use of head nods, but no active command following for any other commands noted through session.       Exercises General  Exercises - Upper Extremity Shoulder Flexion: AAROM;AROM;Both;10 reps;Supine Elbow Flexion: AROM;AAROM;Both;10 reps;Supine General Exercises - Lower Extremity Ankle Circles/Pumps: PROM;Both;10 reps;Supine Heel Slides: PROM;Both;10 reps;Supine Hip ABduction/ADduction: PROM;Both;10 reps;Supine    General Comments        Pertinent Vitals/Pain Pain Assessment:  Faces Faces Pain Scale: Hurts even more Pain Location: generalized discomfort Pain Descriptors / Indicators: Grimacing Pain Intervention(s): Limited activity within patient's tolerance;Monitored during session;Repositioned    Home Living                      Prior Function            PT Goals (current goals can now be found in the care plan section) Acute Rehab PT Goals Patient Stated Goal: to not be sick anymore Progress towards PT goals: Not progressing toward goals - comment(total care )    Frequency    Min 2X/week      PT Plan Current plan remains appropriate    Co-evaluation              AM-PAC PT "6 Clicks" Mobility   Outcome Measure  Help needed turning from your back to your side while in a flat bed without using bedrails?: Total Help needed moving from lying on your back to sitting on the side of a flat bed without using bedrails?: Total Help needed moving to and from a bed to a chair (including a wheelchair)?: Total Help needed standing up from a chair using your arms (e.g., wheelchair or bedside chair)?: Total Help needed to walk in hospital room?: Total Help needed climbing 3-5 steps with a railing? : Total 6 Click Score: 6    End of Session Equipment Utilized During Treatment: Oxygen Activity Tolerance: Other (comment);Patient limited by fatigue(decreased focus) Patient left: in bed;with call bell/phone within reach;with bed alarm set;with family/visitor present Nurse Communication: Mobility status;Need for lift equipment PT Visit Diagnosis: Other abnormalities of gait and mobility (R26.89);Muscle weakness (generalized) (M62.81);Difficulty in walking, not elsewhere classified (R26.2);Other symptoms and signs involving the nervous system (R29.898)     Time: 0093-8182 PT Time Calculation (min) (ACUTE ONLY): 17 min  Charges:  $Therapeutic Activity: 8-22 mins                     Monti Jilek W,PT Acute Rehabilitation Services Pager:   [phone]  Office:  [place] 12/25/2019, 1:44 PM

## 2019-12-25 NOTE — Care Management Important Message (Signed)
Important Message  Patient Details  Name: Becky Hendricks MRN: [id number] Date of Birth: [birth year]   Medicare Important Message Given:  Yes     Renie Ora 12/25/2019, 10:32 AM

## 2019-12-25 NOTE — Progress Notes (Signed)
Pharmacy Antibiotic Note  Becky Hendricks is a 74 y.o. female admitted on 12/20/2019 with AMS and brief cardiac arrest.  Pharmacy has been consulted for vancomycin and ampicllin/sulbactam for osteomyelitis/bascess of the coccyx.  CT this admission showed decrease in size of left buttock fistula and abscess.   Afeb. WBC wnl. Vancomycin level ordered 5/14 then cancelled due to 5/13 AM dose being missed. Will re-order at steady state.  Plan: Vancomycin 1000mg  IV Q12H Amp/sulb 3g Q6 hr Next trough due 5/16 4:30 AM Monitor renal fxn, clinical progress, vanc trough at Css Abx end date 01/21/20   Height: 5\' 4"  (162.6 cm) Weight: 102.2 kg (225 lb 5 oz) IBW/kg (Calculated) : 54.7  Temp (24hrs), Avg:98.8 F (37.1 C), Min:97.9 F (36.6 C), Max:99.9 F (37.7 C)  Recent Labs  Lab 12/20/19 1543 12/20/19 1543 12/20/19 1920 12/20/19 2120 12/21/19 0409 12/21/19 1931 12/22/19 0329 12/22/19 1256 12/22/19 1809 12/23/19 0316 12/24/19 0612 12/25/19 0644  WBC 11.3*   < >  --   --  14.2*  --   --  11.7*  --  8.6 6.6 5.6  CREATININE 0.74   < > 0.61  --  0.53   < > 0.35*  --  0.36* 0.39* 0.38* 0.41*  LATICACIDVEN 6.0*  --  3.9* 3.2*  --   --   --   --   --   --   --   --    < > = values in this interval not displayed.    Estimated Creatinine Clearance: 74 mL/min (A) (by C-G formula based on SCr of 0.41 mg/dL (L)).    Antimicrobials: Vanc 4/22 >>(6/10) Unasyn 4/28 >>(6/10) Merrem 5/9 >> 5/11  Microbiology: 5/9 covid / flu - negative 5/9 UCx - ngtd 5/9  BCx - ngtd  Data from previous admit: 4/25 VT = 15 >> continue 5/2 VT 8 - adjust to 1g q12 5/6 corrected VT~16 - cont current dose  4/22 BC: 1/3 viridan strep 4/22 UCx >>neg 4/23 L-buttock abscess: rare Staphylococcus warneri, Strep anginosis 4/23 MRSA PCR - positive   5/23, PharmD, BCPS, [facility] Clinical Pharmacist  Please check AMION for all [facility] Pharmacy phone numbers After 10:00 PM, call Main Pharmacy [phone]

## 2019-12-25 NOTE — Progress Notes (Addendum)
PROGRESS NOTE    Becky Hendricks  HWE:[id number] DOB: 25-Jun-1946 DOA: 12/20/2019 PCP: Roderic Scarce, MD   Chief Complaint  Patient presents with  . post cpr    Brief Narrative:  Becky Hendricks is a 74 yo F with h/o AF on Eliquis, bipolar disorder, HTN, T2DM, who presented after out of hospital cardiopulmonary arrest. Pt was recently discharged on 12/17/19 stage IV pressure ulcer to be on Unasyn + Vanc until 6/10. On 5/9, staff at the nursing home reported she didn't "look right" at lunch. She was later found in her room agonally breathing without a pulse. She underwent 1-2 minutes of CPR with ROSC. She was intubated in the field and brought to [facility] ED. In the ED, SpO2 100 intubated, BP 120s/90s on epi, blood glucose 300, lactate 6. WBC 11.3 from 10.8 on discharge. ABG was 7.42/64/272/42. Troponin 76 and 161 six hours later. Respiratory panel negative, EKG showed NSR without ischemic changes. She was minimally responsive despite no sedation; she was started on Levophed for BP support and transitioned to Meropenem and admitted to [facility].  Patient subsequently weaned off pressors.  Patient extubated.  Patient on IV antibiotics.  2D echo pending.  Patient transferred to hospitalist team on 12/23/2019.   Assessment & Plan:   Active Problems:   Pressure injury of skin   Respiratory failure with hypoxia (HCC)   Acute metabolic encephalopathy   Severe sepsis (HCC)   Osteomyelitis (HCC)   Abscess of multiple sites of buttock   AF (paroxysmal atrial fibrillation) (HCC)   Cardiac arrest (HCC)   Hyperphosphatemia   Edema of upper extremity  1 cardiopulmonary arrest Patient noted to have had a cardiopulmonary arrest on 12/20/2019 in the nursing home requiring 1 to 2 minutes of CPR before ROSC.  Working diagnosis felt likely secondary to septic shock secondary to stage IV decubitus ulcer with osteomyelitis of the coccyx/sacrum.  Concern for also PE.  Patient noted on admission to be hypotensive requiring pressors  in the setting of osteomyelitis.  Pressors have been weaned off.  It was felt unlikely secondary to PE as patient noted to be compliant with Eliquis.  Chest x-ray done did show some bibasilar infiltrate but repeat showed right pleural effusion not consistent with pneumonia.  Patient with recent 2D echo on 12/08/2019 with a EF of 60 to 65%, no wall motion abnormalities.  Patient with minimally elevated troponin felt consistent with demand ischemia.  Repeat 2D echo with moderately dilated right ventricle with moderately decreased systolic function and new moderate pulmonary hypertension.  EF 65 to 70%.  Left ventricle with normal function.  Grade 2 diastolic dysfunction.  Right ventricular systolic function is moderately reduced.  Right ventricular size moderately enlarged.  Moderately elevated pulmonary artery systolic pressure.  Left atrial size mildly dilated, right atrial size moderately dilated.  With abnormal 2D echo findings, presentation with hypotension and cardiopulmonary arrest and as such CT angiogram chest was done which was negative for PE.  Continue empiric IV antibiotics.  Follow.   2.  Severe sepsis with septic shock secondary to osteomyelitis of the sacrum/coccyx, stage IV pressure ulcer, POA Secondary to osteomyelitis of the sacrum/coccyx.  Patient noted to have a polymicrobial decubitus ulcer with strep viridans bacteremia noted last week.  Patient was treated on IV Unasyn and vancomycin which was discharged on.  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  Stable decubitus ulcer with osteomyelitis involving distal sacrum  and coccyx.  Chest x-ray with some bibasilar infiltrate however repeat chest x-ray showed a right pleural effusion.  Sepsis felt not likely due to a pulmonary infiltrate.  Patient pancultured, blood cultures with no growth to date.  Patient noted to have a midline prior to admission which was removed  on 12/21/2019.  Lactate trending down.  Patient currently off pressors as of 12/22/2019.  Improving clinically.  Continue empiric IV vancomycin IV Unasyn through 01/21/2020 as previously recommended.  Will need new PICC line versus midline prior to discharge once blood cultures have been finalized.  Follow.  3.  Acute metabolic encephalopathy Questionable etiology.  Felt could be secondary to patient's acute cardiopulmonary arrest versus acute CVA.  CT head done negative for any acute abnormalities.  EEG done on 12/21/2019 with moderate to severe diffuse encephalopathy, nonspecific etiology, no seizures or epileptiform discharges noted.  Patient alert to self.  Patient thinks she is in the nursing home.  Patient thinks is in the 61s.  Patient thinks it is January.  Patient however answering questions appropriately.  It was noted that patient did have encephalopathy versus chronic cognitive deficit on prior hospitalization (4/22-5/6) and may be patient's new baseline.  TSH noted to be 2.570.  Vitamin B12 at 363.  HIV nonreactive.  RPR nonreactive.  Patient with some right upper extremity weakness which patient's brother attributes to process of transportation to the hospital when patient was found down and likely secondary to damage to ligaments.  MRI head which was done was negative for any acute abnormalities.  Supportive care.  4.  Stage IV decubitus pressure ulcer/osteomyelitis of the sacrum and coccyx, POA Patient discharged on 12/17/2019 on Unasyn and vancomycin through 01/21/2020.  Wound care consulted.  Continue empiric IV antibiotics.  Follow.  5.  Paroxysmal atrial fibrillation Currently rate controlled.  Eliquis for anticoagulation.  6.  Bipolar disorder Continue Depakote.  7.  Diabetes mellitus type 2 Hemoglobin A1c 7.8 12/03/2019.  CBG of 108 this morning.  Patient seen by speech therapist and currently on a regular diet.  Continue sliding scale insulin.  8.  Hyperphosphatemia Patient was  started on calcium binders.  Repeat phosphorus at 3.0.  9.  Bilateral upper extremity edema Likely secondary to aggressive fluid hydration on presentation.  Patient is +5.5 L.  2D echo with a normal EF, grade 2 diastolic dysfunction.  Patient received a dose of Lasix 20 mg IV x1 12/24/2019 with a urine output of 1.8 L.  Some clinical improvement.  Will place on Lasix 20 mg IV every 12 hours x1 day.  Strict I's and O's.  Daily weights.  Supportive care.  10.  Hypomagnesemia Magnesium sulfate 4 g IV x1.  Repeat magnesium level in the morning.    DVT prophylaxis: Eliquis Code Status: Full Family Communication: Updated patient.  No family at bedside. Disposition:   Status is: Inpatient    Dispo: The patient is from: SNF              Anticipated d/c is to: Likely SNF.               Anticipated d/c date is: Hopefully Monday, 12/28/2019              Patient currently in the progressive care unit, recently off pressors, on empiric IV antibiotics, patient with right upper extremity weakness .Patient not ready for discharge.        Consultants:   Admitted to PCCM    Procedures:  5/9: CT Head  wo contrast: 1. No acute intracranial pathology. 2. Age-related atrophy and chronic microvascular ischemic changes.  5/9: CT Abd Pelvis w Contrast: 1. 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 2. Stable decubitus ulcer with osteomyelitis involving distal sacrum and coccyx. 3. Cholelithiasis. No radiographic evidence of cholecystitis. 4. Stable small uterine fibroids. 5. Increased small right pleural effusion and bibasilar atelectasis.  5/9: CXR: 1. Left internal jugular catheter coiled over the left subclavian vein, with tip normally positioned over the superior vena cava. Please correlate with catheter function. 2. Increasing right basilar consolidation and/or effusion.  5/10 EEG: moderate to severe diffuse encephalopathy, nonspecific  etiology.No seizures ordefiniteepileptiform discharges were seen throughout the recording.  2D echo 12/23/2019  12/20/2019 ETT>>>> 12/22/2019  CT angiogram chest 12/24/2019 MRI head 12/24/2019    Antimicrobials:  4/22 - 4/28: Vanc + Cefepime + lagyl  4/28 - 5/9: Vanc + Unasyn (to be continued until 01/21/20) 5/9 - 5/10: Vanc + Meropenem            5/11 - present: Vanc + Unasyn   Micro Data:  BC x2 NGTD Urine culture NG  Respiratory panel: negative SARS-CoV-2/Influenza A/Influenza B    Subjective: Patient sitting up in bed.  Denies chest pain or shortness of breath.  No abdominal pain.  Alert only to self.   Objective: Vitals:   12/24/19 2348 12/25/19 0100 12/25/19 0605 12/25/19 0735  BP: 139/70  139/69 123/61  Pulse: 65  72 (!) 56  Resp: 18  18 (!) 24  Temp: 97.9 F (36.6 C)  98.3 F (36.8 C) 98.2 F (36.8 C)  TempSrc: Oral  Oral Oral  SpO2: 100% 100% 99% 100%  Weight:   102.2 kg   Height:        Intake/Output Summary (Last 24 hours) at 12/25/2019 1050 Last data filed at 12/25/2019 0849 Gross per 24 hour  Intake 950.72 ml  Output 1650 ml  Net -699.28 ml   Filed Weights   12/23/19 0341 12/24/19 0427 12/25/19 0605  Weight: 100.3 kg 100.3 kg 102.2 kg    Examination:  General exam: NAD.   Respiratory system: Decreased breath sounds in the bases.  No rhonchi.  No wheezing.  Speaking in full sentences.  Normal respiratory effort. Cardiovascular system: RRR no murmurs rubs or gallops.  No JVD.  No lower extremity edema.  Gastrointestinal system: Abdomen is nontender, nondistended, soft, positive bowel sounds.  No rebound.  No guarding.   Central nervous system: Alert and oriented to self only.  Right upper extremity weakness.  Extremities: Bilateral upper extremity swelling improving. RUE weakness. Skin: No rashes, lesions or ulcers Psychiatry: Judgement and insight appear poor to fair. Mood & affect appropriate.     Data Reviewed: I have personally reviewed  following labs and imaging studies  CBC: Recent Labs  Lab 12/20/19 1543 12/20/19 1606 12/21/19 0409 12/21/19 0416 12/22/19 0820 12/22/19 1256 12/23/19 0316 12/24/19 0612 12/25/19 0644  WBC 11.3*   < > 14.2*  --   --  11.7* 8.6 6.6 5.6  NEUTROABS 8.4*  --   --   --   --  8.6* 4.8 3.2 2.5  HGB 10.2*   < > 9.2*   < > 9.2* 8.3* 8.5* 8.6* 8.1*  HCT 36.0   < > 31.0*   < > 27.0* 27.9* 29.0* 29.1* 28.4*  MCV 98.1   < > 95.1  --   --  95.9 97.6 98.3 98.6  PLT 244   < >  207  --   --  151 128* 124* 128*   < > = values in this interval not displayed.    Basic Metabolic Panel: Recent Labs  Lab 12/20/19 1920 12/20/19 2131 12/21/19 0409 12/21/19 0416 12/21/19 1931 12/21/19 1931 12/22/19 0329 12/22/19 0329 12/22/19 0820 12/22/19 1809 12/23/19 0316 12/24/19 0612 12/25/19 0644  NA 138   < > 140   < > 138   < > 138   < > 136 139 137 140 139  K 2.6*   < > 3.9   < > 3.6   < > 3.6   < > 4.0 5.0 4.6 4.3 3.7  CL 87*   < > 93*   < > 91*   < > 94*  --   --  95* 95* 98 96*  CO2 34*   < > 36*   < > 33*   < > 34*  --   --  33* 36* 35* 35*  GLUCOSE 194*   < > 99   < > 191*   < > 202*  --   --  150* 141* 112* 148*  BUN 23   < > 21   < > 20   < > 18  --   --  11 9 5* 7*  CREATININE 0.61   < > 0.53   < > 0.43*   < > 0.35*  --   --  0.36* 0.39* 0.38* 0.41*  CALCIUM 7.7*   < > 8.1*   < > 8.0*   < > 7.8*  --   --  8.0* 7.8* 8.0* 8.0*  MG 1.5*  --  2.4  --   --   --   --   --   --   --   --   --  1.5*  PHOS  --   --  1.6*  --  5.3*  --   --   --   --   --   --  3.0  3.0  --    < > = values in this interval not displayed.    GFR: Estimated Creatinine Clearance: 74 mL/min (A) (by C-G formula based on SCr of 0.41 mg/dL (L)).  Liver Function Tests: Recent Labs  Lab 12/20/19 1543 12/23/19 0316 12/24/19 0612  AST 34 13*  --   ALT 19 18  --   ALKPHOS 77 58  --   BILITOT 0.7 0.7  --   PROT 5.2* 4.7*  --   ALBUMIN 1.9* 1.7* 1.7*    CBG: Recent Labs  Lab 12/24/19 1300 12/24/19 1609  12/24/19 2035 12/25/19 0601 12/25/19 0854  GLUCAP 210* 182* 173* 111* 120*     Recent Results (from the past 240 hour(s))  Respiratory Panel by RT PCR (Flu A&B, Covid) - Nasopharyngeal Swab     Status: None   Collection Time: 12/17/19  2:20 PM   Specimen: Nasopharyngeal Swab  Result Value Ref Range Status   SARS Coronavirus 2 by RT PCR NEGATIVE NEGATIVE Final    Comment: (NOTE) SARS-CoV-2 target nucleic acids are NOT DETECTED. The SARS-CoV-2 RNA is generally detectable in upper respiratoy specimens during the acute phase of infection. The lowest concentration of SARS-CoV-2 viral copies this assay can detect is 131 copies/mL. A negative result does not preclude SARS-Cov-2 infection and should not be used as the sole basis for treatment or other patient management decisions. A negative result may occur with  improper specimen collection/handling, submission of  specimen other than nasopharyngeal swab, presence of viral mutation(s) within the areas targeted by this assay, and inadequate number of viral copies (<131 copies/mL). A negative result must be combined with clinical observations, patient history, and epidemiological information. The expected result is Negative. Fact Sheet for Patients:  https://www.moore.com/ Fact Sheet for Healthcare Providers:  https://www.young.biz/ This test is not yet ap proved or cleared by the [place] FDA and  has been authorized for detection and/or diagnosis of SARS-CoV-2 by FDA under an Emergency Use Authorization (EUA). This EUA will remain  in effect (meaning this test can be used) for the duration of the COVID-19 declaration under Section 564(b)(1) of the Act, 21 U.S.C. section 360bbb-3(b)(1), unless the authorization is terminated or revoked sooner.    Influenza A by PCR NEGATIVE NEGATIVE Final   Influenza B by PCR NEGATIVE NEGATIVE Final    Comment: (NOTE) The Xpert Xpress SARS-CoV-2/FLU/RSV  assay is intended as an aid in  the diagnosis of influenza from Nasopharyngeal swab specimens and  should not be used as a sole basis for treatment. Nasal washings and  aspirates are unacceptable for Xpert Xpress SARS-CoV-2/FLU/RSV  testing. Fact Sheet for Patients: https://www.moore.com/ Fact Sheet for Healthcare Providers: https://www.young.biz/ This test is not yet approved or cleared by the [place] FDA and  has been authorized for detection and/or diagnosis of SARS-CoV-2 by  FDA under an Emergency Use Authorization (EUA). This EUA will remain  in effect (meaning this test can be used) for the duration of the  Covid-19 declaration under Section 564(b)(1) of the Act, 21  U.S.C. section 360bbb-3(b)(1), unless the authorization is  terminated or revoked. Performed at [facility], [address], [place], [place] [address]   Blood culture (routine x 2)     Status: None (Preliminary result)   Collection Time: 12/20/19  3:59 PM   Specimen: BLOOD  Result Value Ref Range Status   Specimen Description BLOOD RIGHT ANTECUBITAL  Final   Special Requests   Final    BOTTLES DRAWN AEROBIC AND ANAEROBIC Blood Culture adequate volume   Culture   Final    NO GROWTH 4 DAYS Performed at [facility], [address], [place], [place] [address]    Report Status PENDING  Incomplete  Respiratory Panel by RT PCR (Flu A&B, Covid) - Nasopharyngeal Swab     Status: None   Collection Time: 12/20/19  4:46 PM   Specimen: Nasopharyngeal Swab  Result Value Ref Range Status   SARS Coronavirus 2 by RT PCR NEGATIVE NEGATIVE Final    Comment: (NOTE) SARS-CoV-2 target nucleic acids are NOT DETECTED. The SARS-CoV-2 RNA is generally detectable in upper respiratoy specimens during the acute phase of infection. The lowest concentration of SARS-CoV-2 viral copies this assay can detect is 131 copies/mL. A negative result does not preclude SARS-Cov-2 infection  and should not be used as the sole basis for treatment or other patient management decisions. A negative result may occur with  improper specimen collection/handling, submission of specimen other than nasopharyngeal swab, presence of viral mutation(s) within the areas targeted by this assay, and inadequate number of viral copies (<131 copies/mL). A negative result must be combined with clinical observations, patient history, and epidemiological information. The expected result is Negative. Fact Sheet for Patients:  https://www.moore.com/ Fact Sheet for Healthcare Providers:  https://www.young.biz/ This test is not yet ap proved or cleared by the [place] FDA and  has been authorized for detection and/or diagnosis of SARS-CoV-2 by FDA under  an Emergency Use Authorization (EUA). This EUA will remain  in effect (meaning this test can be used) for the duration of the COVID-19 declaration under Section 564(b)(1) of the Act, 21 U.S.C. section 360bbb-3(b)(1), unless the authorization is terminated or revoked sooner.    Influenza A by PCR NEGATIVE NEGATIVE Final   Influenza B by PCR NEGATIVE NEGATIVE Final    Comment: (NOTE) The Xpert Xpress SARS-CoV-2/FLU/RSV assay is intended as an aid in  the diagnosis of influenza from Nasopharyngeal swab specimens and  should not be used as a sole basis for treatment. Nasal washings and  aspirates are unacceptable for Xpert Xpress SARS-CoV-2/FLU/RSV  testing. Fact Sheet for Patients: https://www.moore.com/ Fact Sheet for Healthcare Providers: https://www.young.biz/ This test is not yet approved or cleared by the [place] FDA and  has been authorized for detection and/or diagnosis of SARS-CoV-2 by  FDA under an Emergency Use Authorization (EUA). This EUA will remain  in effect (meaning this test can be used) for the duration of the  Covid-19 declaration under Section  564(b)(1) of the Act, 21  U.S.C. section 360bbb-3(b)(1), unless the authorization is  terminated or revoked. Performed at [facility], [address] [place], [place] [address]   Urine Culture     Status: None   Collection Time: 12/20/19  4:53 PM   Specimen: Urine, Random  Result Value Ref Range Status   Specimen Description URINE, RANDOM  Final   Special Requests NONE  Final   Culture   Final    NO GROWTH Performed at [facility], [address], [place], [place] [address]    Report Status 12/21/2019 FINAL  Final  Blood culture (routine x 2)     Status: None (Preliminary result)   Collection Time: 12/20/19  5:21 PM   Specimen: BLOOD RIGHT HAND  Result Value Ref Range Status   Specimen Description BLOOD RIGHT HAND  Final   Special Requests   Final    BOTTLES DRAWN AEROBIC AND ANAEROBIC Blood Culture adequate volume   Culture   Final    NO GROWTH 4 DAYS Performed at [facility], [address] [place], [place] [address]    Report Status PENDING  Incomplete         Radiology Studies: CT ANGIO CHEST PE W OR WO CONTRAST  Result Date: 12/24/2019 CLINICAL DATA:  Shortness of breath EXAM: CT ANGIOGRAPHY CHEST WITH CONTRAST TECHNIQUE: Multidetector CT imaging of the chest was performed using the standard protocol during bolus administration of intravenous contrast. Multiplanar CT image reconstructions and MIPs were obtained to evaluate the vascular anatomy. CONTRAST:  100 mL OMNIPAQUE IOHEXOL 350 MG/ML SOLN COMPARISON:  Chest radiograph Dec 21, 2019 FINDINGS: Cardiovascular: There is no demonstrable pulmonary embolus. There is no appreciable thoracic aortic aneurysm or dissection. Visualized great vessels appear unremarkable. No appreciable pericardial effusion or pericardial thickening evident. Mediastinum/Nodes: Visualized thyroid appears normal. There is no evident thoracic adenopathy. No esophageal lesions are appreciable. Lungs/Pleura: No pneumothorax. There  is a moderate pleural effusion on the right with compressive atelectasis in the right lower lobe. There may well be associated pneumonia in the right lower lobe. There is a smaller left pleural effusion with compressive atelectasis in the left base. Upper Abdomen: There is reflux of contrast into the inferior vena cava and slightly into the hepatic veins. Visualized upper abdominal structures otherwise appear unremarkable. Musculoskeletal: There is elevation of the right hemidiaphragm. There are no blastic or lytic bone lesions. No evident  chest wall lesions. Review of the MIP images confirms the above findings. IMPRESSION: 1. No demonstrable pulmonary embolus. No thoracic aortic aneurysm or dissection. 2. Pleural effusions bilaterally, larger on the right than the left. Compressive atelectasis in both lower lobes. Suspect associated pneumonia within the right lower lobe consolidation. 3.  No adenopathy. 4. Reflux of contrast in the inferior vena cava and hepatic veins is felt to be indicative of increased right heart pressure. 5.  Stable elevation the right hemidiaphragm. Electronically Signed   By: Lowella Grip III M.D.   On: 12/24/2019 12:05   MR BRAIN WO CONTRAST  Result Date: 12/24/2019 CLINICAL DATA:  Encephalopathy. EXAM: MRI HEAD WITHOUT CONTRAST TECHNIQUE: Multiplanar, multiecho pulse sequences of the brain and surrounding structures were obtained without intravenous contrast. COMPARISON:  Head CT Dec 20, 2019 FINDINGS: The study is partially degraded by motion Brain: No acute infarction, hemorrhage, hydrocephalus, extra-axial collection or mass lesion. Scattered foci of T2 hyperintensity are seen within the white matter of the cerebral hemispheres, predominantly periventricular, nonspecific. There is prominence of the supratentorial ventricles, cerebral and cerebellar sulci, reflecting parenchymal volume loss. Vascular: Normal flow voids. Skull and upper cervical spine: Normal marrow signal.  Sinuses/Orbits: Fluid level within the right sphenoid sinus mild mucosal thickening of the bilateral ethmoid cells. The orbits are maintained. Other: Bilateral mastoid effusions, right greater than left. IMPRESSION: 1. No acute intracranial abnormality. 2. Moderate to advanced parenchymal volume loss and mild chronic microvascular ischemic changes. 3. Fluid level within the right sphenoid sinus. Correlate clinically for acute sinusitis. 4. Bilateral mastoid effusions, right greater than left. Electronically Signed   By: Pedro Earls M.D.   On: 12/24/2019 15:10   ECHOCARDIOGRAM COMPLETE  Result Date: 12/23/2019    ECHOCARDIOGRAM REPORT   Patient Name:   Becky Hendricks Date of Exam: 12/23/2019 Medical Rec #:  [id number]      Height:       64.0 in Accession #:    [id number]     Weight:       221.1 lb Date of Birth:  February 22, 1946       BSA:          2.042 m Patient Age:    66 years       BP:           133/59 mmHg Patient Gender: F              HR:           66 bpm. Exam Location:  Inpatient Procedure: 2D Echo Indications:    cardiopulmonary arrest  History:        Patient has prior history of Echocardiogram examinations, most                 recent 12/08/2019. Arrythmias:Atrial Fibrillation; Risk                 Factors:Diabetes and Hypertension.  Sonographer:    Jannett Celestine RDCS (AE) Referring Phys: [id number] Eugenie Filler  Sonographer Comments: limited mobility. off axis apical windows IMPRESSIONS  1. Since the last study on 12/08/2019 LVEF hasn't changes. Right ventricle is now moderately dilated with moderately decreased systolic function and new moderate pulmonary hypertension. Consider pulmonary embolism.  2. Left ventricular ejection fraction, by estimation, is 65 to 70%. The left ventricle has normal function. The left ventricle has no regional wall motion abnormalities. Left ventricular diastolic parameters are consistent with Grade II diastolic dysfunction (pseudonormalization).  3. Right  ventricular systolic  function is moderately reduced. The right ventricular size is moderately enlarged. There is moderately elevated pulmonary artery systolic pressure. The estimated right ventricular systolic pressure is 59.6 mmHg.  4. Left atrial size was mildly dilated.  5. Right atrial size was moderately dilated.  6. The mitral valve is normal in structure. No evidence of mitral valve regurgitation. No evidence of mitral stenosis.  7. The aortic valve is normal in structure. Aortic valve regurgitation is not visualized. Mild to moderate aortic valve sclerosis/calcification is present, without any evidence of aortic stenosis.  8. The inferior vena cava is dilated in size with <50% respiratory variability, suggesting right atrial pressure of 15 mmHg. FINDINGS  Left Ventricle: Left ventricular ejection fraction, by estimation, is 65 to 70%. The left ventricle has normal function. The left ventricle has no regional wall motion abnormalities. The left ventricular internal cavity size was normal in size. There is  no left ventricular hypertrophy. Left ventricular diastolic parameters are consistent with Grade II diastolic dysfunction (pseudonormalization). Normal left ventricular filling pressure. Right Ventricle: The right ventricular size is moderately enlarged. No increase in right ventricular wall thickness. Right ventricular systolic function is moderately reduced. There is moderately elevated pulmonary artery systolic pressure. The tricuspid  regurgitant velocity is 3.34 m/s, and with an assumed right atrial pressure of 15 mmHg, the estimated right ventricular systolic pressure is 59.6 mmHg. Left Atrium: Left atrial size was mildly dilated. Right Atrium: Right atrial size was moderately dilated. Pericardium: There is no evidence of pericardial effusion. Mitral Valve: The mitral valve is normal in structure. Normal mobility of the mitral valve leaflets. No evidence of mitral valve regurgitation. No evidence of  mitral valve stenosis. Tricuspid Valve: The tricuspid valve is normal in structure. Tricuspid valve regurgitation is mild . No evidence of tricuspid stenosis. Aortic Valve: The aortic valve is normal in structure. Aortic valve regurgitation is not visualized. Mild to moderate aortic valve sclerosis/calcification is present, without any evidence of aortic stenosis. Pulmonic Valve: The pulmonic valve was normal in structure. Pulmonic valve regurgitation is not visualized. No evidence of pulmonic stenosis. Aorta: The aortic root is normal in size and structure. Venous: The inferior vena cava is dilated in size with less than 50% respiratory variability, suggesting right atrial pressure of 15 mmHg. IAS/Shunts: No atrial level shunt detected by color flow Doppler.  LEFT VENTRICLE PLAX 2D LVIDd:         5.00 cm  Diastology LVIDs:         3.00 cm  LV e' lateral:   8.05 cm/s LV PW:         1.00 cm  LV E/e' lateral: 10.6 LV IVS:        1.00 cm  LV e' medial:    8.70 cm/s LVOT diam:     2.00 cm  LV E/e' medial:  9.8 LV SV:         54 LV SV Index:   26 LVOT Area:     3.14 cm  RIGHT VENTRICLE RV S prime:     10.00 cm/s TAPSE (M-mode): 2.0 cm LEFT ATRIUM           Index       RIGHT ATRIUM           Index LA diam:      4.40 cm 2.16 cm/m  RA Area:     17.20 cm LA Vol (A4C): 30.1 ml 14.74 ml/m RA Volume:   43.10 ml  21.11 ml/m  AORTIC VALVE LVOT  Vmax:   81.60 cm/s LVOT Vmean:  50.700 cm/s LVOT VTI:    0.172 m  AORTA Ao Root diam: 2.80 cm MITRAL VALVE               TRICUSPID VALVE MV Area (PHT): 3.91 cm    TR Peak grad:   44.6 mmHg MV Decel Time: 194 msec    TR Vmax:        334.00 cm/s MV E velocity: 85.30 cm/s MV A velocity: 84.40 cm/s  SHUNTS MV E/A ratio:  1.01        Systemic VTI:  0.17 m                            Systemic Diam: 2.00 cm Tobias Alexander MD Electronically signed by Tobias Alexander MD Signature Date/Time: 12/23/2019/1:25:23 PM    Final         Scheduled Meds: . apixaban  5 mg Oral BID  . atorvastatin   10 mg Oral Daily  . divalproex  500 mg Oral BID  . insulin aspart  0-20 Units Subcutaneous TID WC  . mouth rinse  15 mL Mouth Rinse BID  . multivitamin with minerals  1 tablet Oral Daily  . mupirocin ointment  1 application Nasal BID  . Ensure Max Protein  11 oz Oral Daily  . senna-docusate  1 tablet Oral Daily   Continuous Infusions: . sodium chloride 250 mL (12/24/19 1611)  . sodium chloride 1,000 mL (12/24/19 1621)  . ampicillin-sulbactam (UNASYN) IV 3 g (12/25/19 0515)  . magnesium sulfate bolus IVPB    . vancomycin 1,000 mg (12/25/19 0600)     LOS: 5 days    Time spent: 45 minutes    Ramiro Harvest, MD [facility]   To contact the attending provider between 7A-7P or the covering provider during after hours 7P-7A, please log into the web site [email or web address] and access using universal [place] password for that web site. If you do not have the password, please call the hospital operator.  12/25/2019, 10:50 AM

## 2019-12-26 ENCOUNTER — Other Ambulatory Visit: Payer: Self-pay

## 2019-12-26 LAB — GLUCOSE, CAPILLARY
Glucose-Capillary: 154 mg/dL — ABNORMAL HIGH (ref 70–99)
Glucose-Capillary: 148 mg/dL — ABNORMAL HIGH (ref 70–99)
Glucose-Capillary: 219 mg/dL — ABNORMAL HIGH (ref 70–99)
Glucose-Capillary: 268 mg/dL — ABNORMAL HIGH (ref 70–99)

## 2019-12-26 LAB — CBC WITH DIFFERENTIAL/PLATELET
Abs Immature Granulocytes: 0.05 10*3/uL (ref 0.00–0.07)
Basophils Absolute: 0 10*3/uL (ref 0.0–0.1)
Basophils Relative: 0 %
Eosinophils Absolute: 0.3 10*3/uL (ref 0.0–0.5)
Eosinophils Relative: 5 %
HCT: 32.4 % — ABNORMAL LOW (ref 36.0–46.0)
Hemoglobin: 9.4 g/dL — ABNORMAL LOW (ref 12.0–15.0)
Immature Granulocytes: 1 %
Lymphocytes Relative: 36 %
Lymphs Abs: 2.7 10*3/uL (ref 0.7–4.0)
MCH: 28.1 pg (ref 26.0–34.0)
MCHC: 29 g/dL — ABNORMAL LOW (ref 30.0–36.0)
MCV: 97 fL (ref 80.0–100.0)
Monocytes Absolute: 1.1 10*3/uL — ABNORMAL HIGH (ref 0.1–1.0)
Monocytes Relative: 15 %
Neutro Abs: 3.3 10*3/uL (ref 1.7–7.7)
Neutrophils Relative %: 43 %
Platelets: 159 10*3/uL (ref 150–400)
RBC: 3.34 MIL/uL — ABNORMAL LOW (ref 3.87–5.11)
RDW: 20.2 % — ABNORMAL HIGH (ref 11.5–15.5)
WBC: 7.6 10*3/uL (ref 4.0–10.5)
nRBC: 0 % (ref 0.0–0.2)

## 2019-12-26 LAB — BASIC METABOLIC PANEL
Anion gap: 9 (ref 5–15)
BUN: 6 mg/dL — ABNORMAL LOW (ref 8–23)
CO2: 36 mmol/L — ABNORMAL HIGH (ref 22–32)
Calcium: 8.3 mg/dL — ABNORMAL LOW (ref 8.9–10.3)
Chloride: 94 mmol/L — ABNORMAL LOW (ref 98–111)
Creatinine, Ser: 0.41 mg/dL — ABNORMAL LOW (ref 0.44–1.00)
GFR calc Af Amer: >60 mL/min (ref >60)
GFR calc non Af Amer: >60 mL/min (ref >60)
Glucose, Bld: 151 mg/dL — ABNORMAL HIGH (ref 70–99)
Potassium: 3.6 mmol/L (ref 3.5–5.1)
Sodium: 139 mmol/L (ref 135–145)

## 2019-12-26 LAB — MAGNESIUM: Magnesium: 2 mg/dL (ref 1.7–2.4)

## 2019-12-26 MED ORDER — ACETAZOLAMIDE 250 MG PO TABS
250.0000 mg | ORAL_TABLET | Freq: Two times a day (BID) | ORAL | Status: AC
  Administered 2019-12-26 – 2019-12-28 (×6): 250 mg via ORAL
  Filled 2019-12-26 (×6): qty 1

## 2019-12-26 MED ORDER — FUROSEMIDE 10 MG/ML IJ SOLN
20.0000 mg | Freq: Two times a day (BID) | INTRAMUSCULAR | Status: AC
  Administered 2019-12-26 (×2): 20 mg via INTRAVENOUS
  Filled 2019-12-26 (×2): qty 2

## 2019-12-26 NOTE — Progress Notes (Signed)
Pt c/a/oriented to self upon shift report. Pt reports generalized discomfort.

## 2019-12-26 NOTE — Progress Notes (Signed)
Patient is still refusing her morning medications stating she 'wants to wait'. They've been offered to her multiple times, she was ok with receiving her IV medications and protein shake only.

## 2019-12-26 NOTE — Progress Notes (Signed)
PROGRESS NOTE    Becky Hendricks  HWE:[id number] DOB: 25-Jun-1946 DOA: 12/20/2019 PCP: Roderic Scarce, MD   Chief Complaint  Patient presents with  . post cpr    Brief Narrative:  Becky Hendricks is a 74 yo F with h/o AF on Eliquis, bipolar disorder, HTN, T2DM, who presented after out of hospital cardiopulmonary arrest. Pt was recently discharged on 12/17/19 stage IV pressure ulcer to be on Unasyn + Vanc until 6/10. On 5/9, staff at the nursing home reported she didn't "look right" at lunch. She was later found in her room agonally breathing without a pulse. She underwent 1-2 minutes of CPR with ROSC. She was intubated in the field and brought to [facility] ED. In the ED, SpO2 100 intubated, BP 120s/90s on epi, blood glucose 300, lactate 6. WBC 11.3 from 10.8 on discharge. ABG was 7.42/64/272/42. Troponin 76 and 161 six hours later. Respiratory panel negative, EKG showed NSR without ischemic changes. She was minimally responsive despite no sedation; she was started on Levophed for BP support and transitioned to Meropenem and admitted to [facility].  Patient subsequently weaned off pressors.  Patient extubated.  Patient on IV antibiotics.  2D echo pending.  Patient transferred to hospitalist team on 12/23/2019.   Assessment & Plan:   Active Problems:   Pressure injury of skin   Respiratory failure with hypoxia (HCC)   Acute metabolic encephalopathy   Severe sepsis (HCC)   Osteomyelitis (HCC)   Abscess of multiple sites of buttock   AF (paroxysmal atrial fibrillation) (HCC)   Cardiac arrest (HCC)   Hyperphosphatemia   Edema of upper extremity  1 cardiopulmonary arrest Patient noted to have had a cardiopulmonary arrest on 12/20/2019 in the nursing home requiring 1 to 2 minutes of CPR before ROSC.  Working diagnosis felt likely secondary to septic shock secondary to stage IV decubitus ulcer with osteomyelitis of the coccyx/sacrum.  Concern for also PE.  Patient noted on admission to be hypotensive requiring pressors  in the setting of osteomyelitis.  Pressors have been weaned off.  It was felt unlikely secondary to PE as patient noted to be compliant with Eliquis.  Chest x-ray done did show some bibasilar infiltrate but repeat showed right pleural effusion not consistent with pneumonia.  Patient with recent 2D echo on 12/08/2019 with a EF of 60 to 65%, no wall motion abnormalities.  Patient with minimally elevated troponin felt consistent with demand ischemia.  Repeat 2D echo with moderately dilated right ventricle with moderately decreased systolic function and new moderate pulmonary hypertension.  EF 65 to 70%.  Left ventricle with normal function.  Grade 2 diastolic dysfunction.  Right ventricular systolic function is moderately reduced.  Right ventricular size moderately enlarged.  Moderately elevated pulmonary artery systolic pressure.  Left atrial size mildly dilated, right atrial size moderately dilated.  With abnormal 2D echo findings, presentation with hypotension and cardiopulmonary arrest and as such CT angiogram chest was done which was negative for PE.  Continue empiric IV antibiotics.  Follow.   2.  Severe sepsis with septic shock secondary to osteomyelitis of the sacrum/coccyx, stage IV pressure ulcer, POA Secondary to osteomyelitis of the sacrum/coccyx.  Patient noted to have a polymicrobial decubitus ulcer with strep viridans bacteremia noted last week.  Patient was treated on IV Unasyn and vancomycin which was discharged on.  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  Stable decubitus ulcer with osteomyelitis involving distal sacrum  and coccyx.  Chest x-ray with some bibasilar infiltrate however repeat chest x-ray showed a right pleural effusion.  Sepsis felt not likely due to a pulmonary infiltrate.  Patient pancultured, blood cultures with no growth to date.  Patient noted to have a midline prior to admission which was removed  on 12/21/2019.  Lactate trending down.  Patient currently off pressors as of 12/22/2019.  Improving clinically.  Continue empiric IV vancomycin IV Unasyn through 01/21/2020 as previously recommended.  Will need new PICC line versus midline prior to discharge once blood cultures have been finalized.  Follow.  3.  Acute metabolic encephalopathy Questionable etiology.  Felt could be secondary to patient's acute cardiopulmonary arrest versus acute CVA.  CT head done negative for any acute abnormalities.  EEG done on 12/21/2019 with moderate to severe diffuse encephalopathy, nonspecific etiology, no seizures or epileptiform discharges noted.  Patient alert to self.  Patient thinks she is in the nursing home.  Patient thinks is in the 48s.  Patient thinks it is January.  Patient however answering questions appropriately.  It was noted that patient did have encephalopathy versus chronic cognitive deficit on prior hospitalization (4/22-5/6) and may be patient's new baseline.  TSH noted to be 2.570.  Vitamin B12 at 363.  HIV nonreactive.  RPR nonreactive.  Patient with some right upper extremity weakness which patient's brother attributes to process of transportation to the hospital when patient was found down and likely secondary to damage to ligaments.  MRI head which was done was negative for any acute abnormalities.  Supportive care.  4.  Stage IV decubitus pressure ulcer/osteomyelitis of the sacrum and coccyx, POA Patient discharged on 12/17/2019 on Unasyn and vancomycin through 01/21/2020.  Wound care consulted.  Continue empiric IV antibiotics.  Follow.  5.  Paroxysmal atrial fibrillation Rate controlled.  Eliquis for anticoagulation.   6.  Bipolar disorder Continue Depakote.  7.  Diabetes mellitus type 2 Hemoglobin A1c 7.8 12/03/2019.  CBG of 154 this morning.  Patient seen by speech therapist and currently on a regular diet.  Continue sliding scale insulin.  8.  Hyperphosphatemia Patient was started on  calcium binders.  Repeat phosphorus at 3.0.  9.  Bilateral upper extremity edema Likely secondary to aggressive fluid hydration on presentation.  Patient is +5.5 L.  2D echo with a normal EF, grade 2 diastolic dysfunction.  Patient received a dose of Lasix 20 mg IV x1 12/24/2019 with a urine output of 1.8 L.  Some clinical improvement.  Patient on Lasix 20 mg IV every 12 hours a urine output of 2.475 L over the past 24 hours.  Continue Lasix 20 mg IV every 12 hours for another day.  Strict I's and O's.  Daily weights.   10.  Hypomagnesemia Magnesium at 2.0.  Follow.     DVT prophylaxis: Eliquis Code Status: Full Family Communication: Updated patient.  No family at bedside. Disposition:   Status is: Inpatient    Dispo: The patient is from: SNF              Anticipated d/c is to: Likely SNF.               Anticipated d/c date is: Hopefully Monday, 12/28/2019              Patient currently in the progressive care unit, recently off pressors, on empiric IV antibiotics, patient with right upper extremity weakness and swelling.Patient not ready for discharge.        Consultants:  Admitted to PCCM    Procedures:  5/9: CT Head wo contrast: 1. No acute intracranial pathology. 2. Age-related atrophy and chronic microvascular ischemic changes.  5/9: CT Abd Pelvis w Contrast: 1. 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 2. Stable decubitus ulcer with osteomyelitis involving distal sacrum and coccyx. 3. Cholelithiasis. No radiographic evidence of cholecystitis. 4. Stable small uterine fibroids. 5. Increased small right pleural effusion and bibasilar atelectasis.  5/9: CXR: 1. Left internal jugular catheter coiled over the left subclavian vein, with tip normally positioned over the superior vena cava. Please correlate with catheter function. 2. Increasing right basilar consolidation and/or effusion.  5/10 EEG: moderate to severe  diffuse encephalopathy, nonspecific etiology.No seizures ordefiniteepileptiform discharges were seen throughout the recording.  2D echo 12/23/2019  12/20/2019 ETT>>>> 12/22/2019  CT angiogram chest 12/24/2019 MRI head 12/24/2019    Antimicrobials:  4/22 - 4/28: Vanc + Cefepime + lagyl  4/28 - 5/9: Vanc + Unasyn (to be continued until 01/21/20) 5/9 - 5/10: Vanc + Meropenem            5/11 - present: Vanc + Unasyn   Micro Data:  BC x2 NGTD Urine culture NG  Respiratory panel: negative SARS-CoV-2/Influenza A/Influenza B    Subjective: Patient laying in bed.  Denies chest pain or shortness of breath.  No abdominal pain.   Objective: Vitals:   12/25/19 2024 12/26/19 0017 12/26/19 0446 12/26/19 0752  BP: (!) 154/84 (!) 163/90 (!) 154/78 (!) 146/84  Pulse: 69 90 69 82  Resp: 19 19 19 18   Temp: 99 F (37.2 C) 99.1 F (37.3 C) (!) 97.5 F (36.4 C) 98 F (36.7 C)  TempSrc: Oral Oral Oral   SpO2: 90% 90% 92% 97%  Weight:   96.5 kg   Height:        Intake/Output Summary (Last 24 hours) at 12/26/2019 0953 Last data filed at 12/26/2019 0900 Gross per 24 hour  Intake 2328.54 ml  Output 2225 ml  Net 103.54 ml   Filed Weights   12/24/19 0427 12/25/19 0605 12/26/19 0446  Weight: 100.3 kg 102.2 kg 96.5 kg    Examination:  General exam: NAD.   Respiratory system: Some decreased breath sounds in the bases.  No rhonchi.  No wheezing.  Speaking in full sentences.  Normal respiratory effort. Cardiovascular system: Regular rate rhythm no murmurs rubs or gallops.  No JVD.  No lower extremity edema.  Gastrointestinal system: Abdomen is soft, nontender, nondistended, positive bowel sounds.  No rebound.  No guarding.  Central nervous system: Alert and oriented to self only.  Right upper extremity weakness.  Extremities: Bilateral upper extremity swelling improving. RUE weakness. Skin: No rashes, lesions or ulcers Psychiatry: Judgement and insight appear poor to fair. Mood & affect  appropriate.     Data Reviewed: I have personally reviewed following labs and imaging studies  CBC: Recent Labs  Lab 12/22/19 1256 12/23/19 0316 12/24/19 0612 12/25/19 0644 12/26/19 0607  WBC 11.7* 8.6 6.6 5.6 7.6  NEUTROABS 8.6* 4.8 3.2 2.5 3.3  HGB 8.3* 8.5* 8.6* 8.1* 9.4*  HCT 27.9* 29.0* 29.1* 28.4* 32.4*  MCV 95.9 97.6 98.3 98.6 97.0  PLT 151 128* 124* 128* 159    Basic Metabolic Panel: Recent Labs  Lab 12/20/19 1920 12/20/19 2131 12/21/19 0409 12/21/19 0416 12/21/19 1931 12/22/19 0329 12/22/19 1809 12/23/19 0316 12/24/19 0612 12/25/19 0644 12/26/19 0607  NA 138   < > 140   < > 138   < >  139 137 140 139 139  K 2.6*   < > 3.9   < > 3.6   < > 5.0 4.6 4.3 3.7 3.6  CL 87*   < > 93*   < > 91*   < > 95* 95* 98 96* 94*  CO2 34*   < > 36*   < > 33*   < > 33* 36* 35* 35* 36*  GLUCOSE 194*   < > 99   < > 191*   < > 150* 141* 112* 148* 151*  BUN 23   < > 21   < > 20   < > 11 9 5* 7* 6*  CREATININE 0.61   < > 0.53   < > 0.43*   < > 0.36* 0.39* 0.38* 0.41* 0.41*  CALCIUM 7.7*   < > 8.1*   < > 8.0*   < > 8.0* 7.8* 8.0* 8.0* 8.3*  MG 1.5*  --  2.4  --   --   --   --   --   --  1.5* 2.0  PHOS  --   --  1.6*  --  5.3*  --   --   --  3.0  3.0  --   --    < > = values in this interval not displayed.    GFR: Estimated Creatinine Clearance: 71.6 mL/min (A) (by C-G formula based on SCr of 0.41 mg/dL (L)).  Liver Function Tests: Recent Labs  Lab 12/20/19 1543 12/23/19 0316 12/24/19 0612  AST 34 13*  --   ALT 19 18  --   ALKPHOS 77 58  --   BILITOT 0.7 0.7  --   PROT 5.2* 4.7*  --   ALBUMIN 1.9* 1.7* 1.7*    CBG: Recent Labs  Lab 12/25/19 0854 12/25/19 1114 12/25/19 1626 12/25/19 2124 12/26/19 0640  GLUCAP 120* 100* 260* 190* 154*     Recent Results (from the past 240 hour(s))  Respiratory Panel by RT PCR (Flu A&B, Covid) - Nasopharyngeal Swab     Status: None   Collection Time: 12/17/19  2:20 PM   Specimen: Nasopharyngeal Swab  Result Value Ref Range  Status   SARS Coronavirus 2 by RT PCR NEGATIVE NEGATIVE Final    Comment: (NOTE) SARS-CoV-2 target nucleic acids are NOT DETECTED. The SARS-CoV-2 RNA is generally detectable in upper respiratoy specimens during the acute phase of infection. The lowest concentration of SARS-CoV-2 viral copies this assay can detect is 131 copies/mL. A negative result does not preclude SARS-Cov-2 infection and should not be used as the sole basis for treatment or other patient management decisions. A negative result may occur with  improper specimen collection/handling, submission of specimen other than nasopharyngeal swab, presence of viral mutation(s) within the areas targeted by this assay, and inadequate number of viral copies (<131 copies/mL). A negative result must be combined with clinical observations, patient history, and epidemiological information. The expected result is Negative. Fact Sheet for Patients:  https://www.moore.com/https://www.fda.gov/media/142436/download Fact Sheet for Healthcare Providers:  https://www.young.biz/https://www.fda.gov/media/142435/download This test is not yet ap proved or cleared by the [place] FDA and  has been authorized for detection and/or diagnosis of SARS-CoV-2 by FDA under an Emergency Use Authorization (EUA). This EUA will remain  in effect (meaning this test can be used) for the duration of the COVID-19 declaration under Section 564(b)(1) of the Act, 21 U.S.C. section 360bbb-3(b)(1), unless the authorization is terminated or revoked sooner.    Influenza A by PCR NEGATIVE  NEGATIVE Final   Influenza B by PCR NEGATIVE NEGATIVE Final    Comment: (NOTE) The Xpert Xpress SARS-CoV-2/FLU/RSV assay is intended as an aid in  the diagnosis of influenza from Nasopharyngeal swab specimens and  should not be used as a sole basis for treatment. Nasal washings and  aspirates are unacceptable for Xpert Xpress SARS-CoV-2/FLU/RSV  testing. Fact Sheet for  Patients: https://www.moore.com/ Fact Sheet for Healthcare Providers: https://www.young.biz/ This test is not yet approved or cleared by the [place] FDA and  has been authorized for detection and/or diagnosis of SARS-CoV-2 by  FDA under an Emergency Use Authorization (EUA). This EUA will remain  in effect (meaning this test can be used) for the duration of the  Covid-19 declaration under Section 564(b)(1) of the Act, 21  U.S.C. section 360bbb-3(b)(1), unless the authorization is  terminated or revoked. Performed at [facility], [address], [place], [place] [address]   Blood culture (routine x 2)     Status: None   Collection Time: 12/20/19  3:59 PM   Specimen: BLOOD  Result Value Ref Range Status   Specimen Description BLOOD RIGHT ANTECUBITAL  Final   Special Requests   Final    BOTTLES DRAWN AEROBIC AND ANAEROBIC Blood Culture adequate volume   Culture   Final    NO GROWTH 5 DAYS Performed at [facility], [address] [place], [place] [address]    Report Status 12/25/2019 FINAL  Final  Respiratory Panel by RT PCR (Flu A&B, Covid) - Nasopharyngeal Swab     Status: None   Collection Time: 12/20/19  4:46 PM   Specimen: Nasopharyngeal Swab  Result Value Ref Range Status   SARS Coronavirus 2 by RT PCR NEGATIVE NEGATIVE Final    Comment: (NOTE) SARS-CoV-2 target nucleic acids are NOT DETECTED. The SARS-CoV-2 RNA is generally detectable in upper respiratoy specimens during the acute phase of infection. The lowest concentration of SARS-CoV-2 viral copies this assay can detect is 131 copies/mL. A negative result does not preclude SARS-Cov-2 infection and should not be used as the sole basis for treatment or other patient management decisions. A negative result may occur with  improper specimen collection/handling, submission of specimen other than nasopharyngeal swab, presence of viral mutation(s) within the areas  targeted by this assay, and inadequate number of viral copies (<131 copies/mL). A negative result must be combined with clinical observations, patient history, and epidemiological information. The expected result is Negative. Fact Sheet for Patients:  https://www.moore.com/ Fact Sheet for Healthcare Providers:  https://www.young.biz/ This test is not yet ap proved or cleared by the [place] FDA and  has been authorized for detection and/or diagnosis of SARS-CoV-2 by FDA under an Emergency Use Authorization (EUA). This EUA will remain  in effect (meaning this test can be used) for the duration of the COVID-19 declaration under Section 564(b)(1) of the Act, 21 U.S.C. section 360bbb-3(b)(1), unless the authorization is terminated or revoked sooner.    Influenza A by PCR NEGATIVE NEGATIVE Final   Influenza B by PCR NEGATIVE NEGATIVE Final    Comment: (NOTE) The Xpert Xpress SARS-CoV-2/FLU/RSV assay is intended as an aid in  the diagnosis of influenza from Nasopharyngeal swab specimens and  should not be used as a sole basis for treatment. Nasal washings and  aspirates are unacceptable for Xpert Xpress SARS-CoV-2/FLU/RSV  testing. Fact Sheet for Patients: https://www.moore.com/ Fact Sheet for Healthcare Providers: https://www.young.biz/ This test is not yet approved or cleared by the [place] FDA  and  has been authorized for detection and/or diagnosis of SARS-CoV-2 by  FDA under an Emergency Use Authorization (EUA). This EUA will remain  in effect (meaning this test can be used) for the duration of the  Covid-19 declaration under Section 564(b)(1) of the Act, 21  U.S.C. section 360bbb-3(b)(1), unless the authorization is  terminated or revoked. Performed at [facility], [address] [place], [place] [address]   Urine Culture     Status: None   Collection Time: 12/20/19  4:53 PM    Specimen: Urine, Random  Result Value Ref Range Status   Specimen Description URINE, RANDOM  Final   Special Requests NONE  Final   Culture   Final    NO GROWTH Performed at [facility], [address] [place], [place] [address]    Report Status 12/21/2019 FINAL  Final  Blood culture (routine x 2)     Status: None   Collection Time: 12/20/19  5:21 PM   Specimen: BLOOD RIGHT HAND  Result Value Ref Range Status   Specimen Description BLOOD RIGHT HAND  Final   Special Requests   Final    BOTTLES DRAWN AEROBIC AND ANAEROBIC Blood Culture adequate volume   Culture   Final    NO GROWTH 5 DAYS Performed at [facility], [address], [place], [place] [address]    Report Status 12/25/2019 FINAL  Final         Radiology Studies: CT ANGIO CHEST PE W OR WO CONTRAST  Result Date: 12/24/2019 CLINICAL DATA:  Shortness of breath EXAM: CT ANGIOGRAPHY CHEST WITH CONTRAST TECHNIQUE: Multidetector CT imaging of the chest was performed using the standard protocol during bolus administration of intravenous contrast. Multiplanar CT image reconstructions and MIPs were obtained to evaluate the vascular anatomy. CONTRAST:  100 mL OMNIPAQUE IOHEXOL 350 MG/ML SOLN COMPARISON:  Chest radiograph Dec 21, 2019 FINDINGS: Cardiovascular: There is no demonstrable pulmonary embolus. There is no appreciable thoracic aortic aneurysm or dissection. Visualized great vessels appear unremarkable. No appreciable pericardial effusion or pericardial thickening evident. Mediastinum/Nodes: Visualized thyroid appears normal. There is no evident thoracic adenopathy. No esophageal lesions are appreciable. Lungs/Pleura: No pneumothorax. There is a moderate pleural effusion on the right with compressive atelectasis in the right lower lobe. There may well be associated pneumonia in the right lower lobe. There is a smaller left pleural effusion with compressive atelectasis in the left base. Upper Abdomen: There is reflux  of contrast into the inferior vena cava and slightly into the hepatic veins. Visualized upper abdominal structures otherwise appear unremarkable. Musculoskeletal: There is elevation of the right hemidiaphragm. There are no blastic or lytic bone lesions. No evident chest wall lesions. Review of the MIP images confirms the above findings. IMPRESSION: 1. No demonstrable pulmonary embolus. No thoracic aortic aneurysm or dissection. 2. Pleural effusions bilaterally, larger on the right than the left. Compressive atelectasis in both lower lobes. Suspect associated pneumonia within the right lower lobe consolidation. 3.  No adenopathy. 4. Reflux of contrast in the inferior vena cava and hepatic veins is felt to be indicative of increased right heart pressure. 5.  Stable elevation the right hemidiaphragm. Electronically Signed   By: Bretta Bang III M.D.   On: 12/24/2019 12:05   MR BRAIN WO CONTRAST  Result Date: 12/24/2019 CLINICAL DATA:  Encephalopathy. EXAM: MRI HEAD WITHOUT CONTRAST TECHNIQUE: Multiplanar, multiecho pulse sequences of the brain and surrounding structures were obtained without intravenous contrast. COMPARISON:  Head CT  Dec 20, 2019 FINDINGS: The study is partially degraded by motion Brain: No acute infarction, hemorrhage, hydrocephalus, extra-axial collection or mass lesion. Scattered foci of T2 hyperintensity are seen within the white matter of the cerebral hemispheres, predominantly periventricular, nonspecific. There is prominence of the supratentorial ventricles, cerebral and cerebellar sulci, reflecting parenchymal volume loss. Vascular: Normal flow voids. Skull and upper cervical spine: Normal marrow signal. Sinuses/Orbits: Fluid level within the right sphenoid sinus mild mucosal thickening of the bilateral ethmoid cells. The orbits are maintained. Other: Bilateral mastoid effusions, right greater than left. IMPRESSION: 1. No acute intracranial abnormality. 2. Moderate to advanced  parenchymal volume loss and mild chronic microvascular ischemic changes. 3. Fluid level within the right sphenoid sinus. Correlate clinically for acute sinusitis. 4. Bilateral mastoid effusions, right greater than left. Electronically Signed   By: Pedro Earls M.D.   On: 12/24/2019 15:10        Scheduled Meds: . apixaban  5 mg Oral BID  . atorvastatin  10 mg Oral Daily  . divalproex  500 mg Oral BID  . insulin aspart  0-20 Units Subcutaneous TID WC  . mouth rinse  15 mL Mouth Rinse BID  . multivitamin with minerals  1 tablet Oral Daily  . Ensure Max Protein  11 oz Oral Daily  . senna-docusate  1 tablet Oral Daily   Continuous Infusions: . sodium chloride Stopped (12/25/19 1231)  . sodium chloride 1,000 mL (12/26/19 0554)  . ampicillin-sulbactam (UNASYN) IV 3 g (12/26/19 0457)  . vancomycin 1,000 mg (12/26/19 0555)     LOS: 6 days    Time spent: 40 minutes    Irine Seal, MD [facility]   To contact the attending provider between 7A-7P or the covering provider during after hours 7P-7A, please log into the web site [email or web address] and access using universal [place] password for that web site. If you do not have the password, please call the hospital operator.  12/26/2019, 9:53 AM

## 2019-12-27 ENCOUNTER — Inpatient Hospital Stay: Payer: Self-pay

## 2019-12-27 LAB — CBC WITH DIFFERENTIAL/PLATELET
Abs Immature Granulocytes: 0.07 10*3/uL (ref 0.00–0.07)
Basophils Absolute: 0 10*3/uL (ref 0.0–0.1)
Basophils Relative: 0 %
Eosinophils Absolute: 0.4 10*3/uL (ref 0.0–0.5)
Eosinophils Relative: 6 %
HCT: 34.6 % — ABNORMAL LOW (ref 36.0–46.0)
Hemoglobin: 10.1 g/dL — ABNORMAL LOW (ref 12.0–15.0)
Immature Granulocytes: 1 %
Lymphocytes Relative: 31 %
Lymphs Abs: 2.1 10*3/uL (ref 0.7–4.0)
MCH: 28.1 pg (ref 26.0–34.0)
MCHC: 29.2 g/dL — ABNORMAL LOW (ref 30.0–36.0)
MCV: 96.4 fL (ref 80.0–100.0)
Monocytes Absolute: 0.9 10*3/uL (ref 0.1–1.0)
Monocytes Relative: 14 %
Neutro Abs: 3.2 10*3/uL (ref 1.7–7.7)
Neutrophils Relative %: 48 %
Platelets: 171 10*3/uL (ref 150–400)
RBC: 3.59 MIL/uL — ABNORMAL LOW (ref 3.87–5.11)
RDW: 19.9 % — ABNORMAL HIGH (ref 11.5–15.5)
WBC: 6.5 10*3/uL (ref 4.0–10.5)
nRBC: 0 % (ref 0.0–0.2)

## 2019-12-27 LAB — GLUCOSE, CAPILLARY
Glucose-Capillary: 125 mg/dL — ABNORMAL HIGH (ref 70–99)
Glucose-Capillary: 144 mg/dL — ABNORMAL HIGH (ref 70–99)
Glucose-Capillary: 175 mg/dL — ABNORMAL HIGH (ref 70–99)
Glucose-Capillary: 209 mg/dL — ABNORMAL HIGH (ref 70–99)

## 2019-12-27 LAB — BASIC METABOLIC PANEL
Anion gap: 8 (ref 5–15)
BUN: 7 mg/dL — ABNORMAL LOW (ref 8–23)
CO2: 33 mmol/L — ABNORMAL HIGH (ref 22–32)
Calcium: 8.7 mg/dL — ABNORMAL LOW (ref 8.9–10.3)
Chloride: 100 mmol/L (ref 98–111)
Creatinine, Ser: 0.47 mg/dL (ref 0.44–1.00)
GFR calc Af Amer: >60 mL/min (ref >60)
GFR calc non Af Amer: >60 mL/min (ref >60)
Glucose, Bld: 142 mg/dL — ABNORMAL HIGH (ref 70–99)
Potassium: 3.3 mmol/L — ABNORMAL LOW (ref 3.5–5.1)
Sodium: 141 mmol/L (ref 135–145)

## 2019-12-27 LAB — VANCOMYCIN, TROUGH: Vancomycin Tr: 21 ug/mL (ref 15–20)

## 2019-12-27 MED ORDER — POTASSIUM CHLORIDE CRYS ER 20 MEQ PO TBCR
40.0000 meq | EXTENDED_RELEASE_TABLET | Freq: Once | ORAL | Status: AC
  Administered 2019-12-27: 40 meq via ORAL
  Filled 2019-12-27: qty 2

## 2019-12-27 MED ORDER — FUROSEMIDE 10 MG/ML IJ SOLN
20.0000 mg | Freq: Two times a day (BID) | INTRAMUSCULAR | Status: AC
  Administered 2019-12-27 (×2): 20 mg via INTRAVENOUS
  Filled 2019-12-27 (×2): qty 2

## 2019-12-27 NOTE — Progress Notes (Signed)
PROGRESS NOTE    Becky Hendricks  HWE:[id number] DOB: 25-Jun-1946 DOA: 12/20/2019 PCP: Roderic Scarce, MD   Chief Complaint  Patient presents with  . post cpr    Brief Narrative:  Becky Hendricks is a 74 yo F with h/o AF on Eliquis, bipolar disorder, HTN, T2DM, who presented after out of hospital cardiopulmonary arrest. Pt was recently discharged on 12/17/19 stage IV pressure ulcer to be on Unasyn + Vanc until 6/10. On 5/9, staff at the nursing home reported she didn't "look right" at lunch. She was later found in her room agonally breathing without a pulse. She underwent 1-2 minutes of CPR with ROSC. She was intubated in the field and brought to [facility] ED. In the ED, SpO2 100 intubated, BP 120s/90s on epi, blood glucose 300, lactate 6. WBC 11.3 from 10.8 on discharge. ABG was 7.42/64/272/42. Troponin 76 and 161 six hours later. Respiratory panel negative, EKG showed NSR without ischemic changes. She was minimally responsive despite no sedation; she was started on Levophed for BP support and transitioned to Meropenem and admitted to [facility].  Patient subsequently weaned off pressors.  Patient extubated.  Patient on IV antibiotics.  2D echo pending.  Patient transferred to hospitalist team on 12/23/2019.   Assessment & Plan:   Active Problems:   Pressure injury of skin   Respiratory failure with hypoxia (HCC)   Acute metabolic encephalopathy   Severe sepsis (HCC)   Osteomyelitis (HCC)   Abscess of multiple sites of buttock   AF (paroxysmal atrial fibrillation) (HCC)   Cardiac arrest (HCC)   Hyperphosphatemia   Edema of upper extremity  1 cardiopulmonary arrest Patient noted to have had a cardiopulmonary arrest on 12/20/2019 in the nursing home requiring 1 to 2 minutes of CPR before ROSC.  Working diagnosis felt likely secondary to septic shock secondary to stage IV decubitus ulcer with osteomyelitis of the coccyx/sacrum.  Concern for also PE.  Patient noted on admission to be hypotensive requiring pressors  in the setting of osteomyelitis.  Pressors have been weaned off.  It was felt unlikely secondary to PE as patient noted to be compliant with Eliquis.  Chest x-ray done did show some bibasilar infiltrate but repeat showed right pleural effusion not consistent with pneumonia.  Patient with recent 2D echo on 12/08/2019 with a EF of 60 to 65%, no wall motion abnormalities.  Patient with minimally elevated troponin felt consistent with demand ischemia.  Repeat 2D echo with moderately dilated right ventricle with moderately decreased systolic function and new moderate pulmonary hypertension.  EF 65 to 70%.  Left ventricle with normal function.  Grade 2 diastolic dysfunction.  Right ventricular systolic function is moderately reduced.  Right ventricular size moderately enlarged.  Moderately elevated pulmonary artery systolic pressure.  Left atrial size mildly dilated, right atrial size moderately dilated.  With abnormal 2D echo findings, presentation with hypotension and cardiopulmonary arrest and as such CT angiogram chest was done which was negative for PE.  Continue empiric IV antibiotics.  Follow.   2.  Severe sepsis with septic shock secondary to osteomyelitis of the sacrum/coccyx, stage IV pressure ulcer, POA Secondary to osteomyelitis of the sacrum/coccyx.  Patient noted to have a polymicrobial decubitus ulcer with strep viridans bacteremia noted last week.  Patient was treated on IV Unasyn and vancomycin which was discharged on.  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  Stable decubitus ulcer with osteomyelitis involving distal sacrum  and coccyx.  Chest x-ray with some bibasilar infiltrate however repeat chest x-ray showed a right pleural effusion.  Sepsis felt not likely due to a pulmonary infiltrate.  Patient pancultured, blood cultures with no growth to date.  Patient noted to have a midline prior to admission which was removed  on 12/21/2019.  Lactate trending down.  Patient currently off pressors as of 12/22/2019.  Improving clinically.  Continue empiric IV vancomycin IV Unasyn through 01/21/2020 as previously recommended.  Will need new PICC line versus midline prior to discharge once blood cultures have been finalized.  Blood cultures negative x5 days.  Place PICC line.  Follow.  3.  Acute metabolic encephalopathy Questionable etiology.  Felt could be secondary to patient's acute cardiopulmonary arrest versus acute CVA.  CT head done negative for any acute abnormalities.  EEG done on 12/21/2019 with moderate to severe diffuse encephalopathy, nonspecific etiology, no seizures or epileptiform discharges noted.  Patient alert to self.  Patient thinks she is in the nursing home.  Patient thinks is in the 24s.  Patient thinks it is January.  Patient however answering questions appropriately.  It was noted that patient did have encephalopathy versus chronic cognitive deficit on prior hospitalization (4/22-5/6) and may be patient's new baseline.  TSH noted to be 2.570.  Vitamin B12 at 363.  HIV nonreactive.  RPR nonreactive.  Patient with some right upper extremity weakness which patient's brother attributes to process of transportation to the hospital when patient was found down and likely secondary to damage to ligaments.  MRI head which was done was negative for any acute abnormalities.  Supportive care.  4.  Stage IV decubitus pressure ulcer/osteomyelitis of the sacrum and coccyx, POA Patient discharged on 12/17/2019 on Unasyn and vancomycin through 01/21/2020.  Wound care consulted.  Continue empiric IV antibiotics.  Follow.  5.  Paroxysmal atrial fibrillation Rate controlled.  Eliquis for anticoagulation.   6.  Bipolar disorder Continue Depakote.  7.  Diabetes mellitus type 2 Hemoglobin A1c 7.8 12/03/2019.  CBG of 125 this morning.  Patient seen by speech therapist and currently on a regular diet.  Continue sliding scale  insulin.  8.  Hyperphosphatemia Patient was started on calcium binders.  Repeat phosphorus at 3.0.  9.  Bilateral upper extremity edema Likely secondary to aggressive fluid hydration on presentation.  Patient is +5.5 L.  2D echo with a normal EF, grade 2 diastolic dysfunction.  Patient received a dose of Lasix 20 mg IV x1 12/24/2019 with a urine output of 1.8 L.  Patient has been receiving Lasix 20 mg IV every 12 hours for the past 2 days and now with a urine output of 1.3 L over the past 24 hours.  Continue Lasix 20 g IV every 12 hours for another day.  Strict I's and O's.  Daily weights.    10.  Hypomagnesemia Magnesium at 2.0.  Follow.     DVT prophylaxis: Eliquis Code Status: Full Family Communication: Updated patient.  No family at bedside. Disposition:   Status is: Inpatient    Dispo: The patient is from: SNF              Anticipated d/c is to: Likely SNF.               Anticipated d/c date is: Hopefully Monday, 12/28/2019              Patient recently off pressors, on empiric IV antibiotics, patient with right upper extremity weakness and swelling receiving IV  Lasix.Patient not ready for discharge.        Consultants:   Admitted to PCCM    Procedures:  5/9: CT Head wo contrast: 1. No acute intracranial pathology. 2. Age-related atrophy and chronic microvascular ischemic changes.  5/9: CT Abd Pelvis w Contrast: 1. 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 2. Stable decubitus ulcer with osteomyelitis involving distal sacrum and coccyx. 3. Cholelithiasis. No radiographic evidence of cholecystitis. 4. Stable small uterine fibroids. 5. Increased small right pleural effusion and bibasilar atelectasis.  5/9: CXR: 1. Left internal jugular catheter coiled over the left subclavian vein, with tip normally positioned over the superior vena cava. Please correlate with catheter function. 2. Increasing right basilar  consolidation and/or effusion.  5/10 EEG: moderate to severe diffuse encephalopathy, nonspecific etiology.No seizures ordefiniteepileptiform discharges were seen throughout the recording.  2D echo 12/23/2019  12/20/2019 ETT>>>> 12/22/2019  CT angiogram chest 12/24/2019 MRI head 12/24/2019    Antimicrobials:  4/22 - 4/28: Vanc + Cefepime + lagyl  4/28 - 5/9: Vanc + Unasyn (to be continued until 01/21/20) 5/9 - 5/10: Vanc + Meropenem            5/11 - present: Vanc + Unasyn   Micro Data:  BC x2 NGTD Urine culture NG  Respiratory panel: negative SARS-CoV-2/Influenza A/Influenza B    Subjective: Patient sleeping but arousable.  Denies chest pain or shortness of breath.  No abdominal pain.   Objective: Vitals:   12/26/19 2059 12/26/19 2101 12/27/19 0322 12/27/19 0446  BP: (!) 152/94 (!) 152/94  136/64  Pulse: 79 79  (!) 53  Resp:  16  19  Temp:  (!) 97.5 F (36.4 C)  97.6 F (36.4 C)  TempSrc:  Oral  Oral  SpO2:  96%  95%  Weight:   95.3 kg   Height:        Intake/Output Summary (Last 24 hours) at 12/27/2019 1004 Last data filed at 12/27/2019 0900 Gross per 24 hour  Intake 860 ml  Output 1900 ml  Net -1040 ml   Filed Weights   12/25/19 0605 12/26/19 0446 12/27/19 0322  Weight: 102.2 kg 96.5 kg 95.3 kg    Examination:  General exam: NAD.   Respiratory system: Decreased breath sounds in the bases.  No wheezing, no rhonchi.  Normal respiratory effort.   Cardiovascular system: RRR no murmurs rubs or gallops.  No JVD.  No lower extremity edema.  Gastrointestinal system: Abdomen is nontender, nondistended, soft, positive bowel sounds.  No rebound.  No guarding.   Central nervous system: Alert and oriented to self only.  Right upper extremity weakness.  Extremities: Bilateral upper extremity swelling improving.  Right upper extremity weakness. Skin: No rashes, lesions or ulcers Psychiatry: Judgement and insight appear poor to fair. Mood & affect appropriate.      Data Reviewed: I have personally reviewed following labs and imaging studies  CBC: Recent Labs  Lab 12/23/19 0316 12/24/19 0612 12/25/19 0644 12/26/19 0607 12/27/19 0427  WBC 8.6 6.6 5.6 7.6 6.5  NEUTROABS 4.8 3.2 2.5 3.3 3.2  HGB 8.5* 8.6* 8.1* 9.4* 10.1*  HCT 29.0* 29.1* 28.4* 32.4* 34.6*  MCV 97.6 98.3 98.6 97.0 96.4  PLT 128* 124* 128* 159 171    Basic Metabolic Panel: Recent Labs  Lab 12/20/19 1920 12/20/19 2131 12/21/19 0409 12/21/19 0416 12/21/19 1931 12/22/19 0329 12/23/19 0316 12/24/19 0612 12/25/19 0644 12/26/19 0607 12/27/19 0427  NA 138   < > 140   < >  138   < > 137 140 139 139 141  K 2.6*   < > 3.9   < > 3.6   < > 4.6 4.3 3.7 3.6 3.3*  CL 87*   < > 93*   < > 91*   < > 95* 98 96* 94* 100  CO2 34*   < > 36*   < > 33*   < > 36* 35* 35* 36* 33*  GLUCOSE 194*   < > 99   < > 191*   < > 141* 112* 148* 151* 142*  BUN 23   < > 21   < > 20   < > 9 5* 7* 6* 7*  CREATININE 0.61   < > 0.53   < > 0.43*   < > 0.39* 0.38* 0.41* 0.41* 0.47  CALCIUM 7.7*   < > 8.1*   < > 8.0*   < > 7.8* 8.0* 8.0* 8.3* 8.7*  MG 1.5*  --  2.4  --   --   --   --   --  1.5* 2.0  --   PHOS  --   --  1.6*  --  5.3*  --   --  3.0  3.0  --   --   --    < > = values in this interval not displayed.    GFR: Estimated Creatinine Clearance: 71.1 mL/min (by C-G formula based on SCr of 0.47 mg/dL).  Liver Function Tests: Recent Labs  Lab 12/20/19 1543 12/23/19 0316 12/24/19 0612  AST 34 13*  --   ALT 19 18  --   ALKPHOS 77 58  --   BILITOT 0.7 0.7  --   PROT 5.2* 4.7*  --   ALBUMIN 1.9* 1.7* 1.7*    CBG: Recent Labs  Lab 12/26/19 0640 12/26/19 1105 12/26/19 1558 12/26/19 2059 12/27/19 0617  GLUCAP 154* 148* 219* 268* 125*     Recent Results (from the past 240 hour(s))  Respiratory Panel by RT PCR (Flu A&B, Covid) - Nasopharyngeal Swab     Status: None   Collection Time: 12/17/19  2:20 PM   Specimen: Nasopharyngeal Swab  Result Value Ref Range Status   SARS  Coronavirus 2 by RT PCR NEGATIVE NEGATIVE Final    Comment: (NOTE) SARS-CoV-2 target nucleic acids are NOT DETECTED. The SARS-CoV-2 RNA is generally detectable in upper respiratoy specimens during the acute phase of infection. The lowest concentration of SARS-CoV-2 viral copies this assay can detect is 131 copies/mL. A negative result does not preclude SARS-Cov-2 infection and should not be used as the sole basis for treatment or other patient management decisions. A negative result may occur with  improper specimen collection/handling, submission of specimen other than nasopharyngeal swab, presence of viral mutation(s) within the areas targeted by this assay, and inadequate number of viral copies (<131 copies/mL). A negative result must be combined with clinical observations, patient history, and epidemiological information. The expected result is Negative. Fact Sheet for Patients:  https://www.moore.com/ Fact Sheet for Healthcare Providers:  https://www.young.biz/ This test is not yet ap proved or cleared by the [place] FDA and  has been authorized for detection and/or diagnosis of SARS-CoV-2 by FDA under an Emergency Use Authorization (EUA). This EUA will remain  in effect (meaning this test can be used) for the duration of the COVID-19 declaration under Section 564(b)(1) of the Act, 21 U.S.C. section 360bbb-3(b)(1), unless the authorization is terminated or revoked sooner.    Influenza A  by PCR NEGATIVE NEGATIVE Final   Influenza B by PCR NEGATIVE NEGATIVE Final    Comment: (NOTE) The Xpert Xpress SARS-CoV-2/FLU/RSV assay is intended as an aid in  the diagnosis of influenza from Nasopharyngeal swab specimens and  should not be used as a sole basis for treatment. Nasal washings and  aspirates are unacceptable for Xpert Xpress SARS-CoV-2/FLU/RSV  testing. Fact Sheet for Patients: https://www.moore.com/https://www.fda.gov/media/142436/download Fact Sheet  for Healthcare Providers: https://www.young.biz/https://www.fda.gov/media/142435/download This test is not yet approved or cleared by the [place] FDA and  has been authorized for detection and/or diagnosis of SARS-CoV-2 by  FDA under an Emergency Use Authorization (EUA). This EUA will remain  in effect (meaning this test can be used) for the duration of the  Covid-19 declaration under Section 564(b)(1) of the Act, 21  U.S.C. section 360bbb-3(b)(1), unless the authorization is  terminated or revoked. Performed at [facility], [address] [place], [place] [address]   Blood culture (routine x 2)     Status: None   Collection Time: 12/20/19  3:59 PM   Specimen: BLOOD  Result Value Ref Range Status   Specimen Description BLOOD RIGHT ANTECUBITAL  Final   Special Requests   Final    BOTTLES DRAWN AEROBIC AND ANAEROBIC Blood Culture adequate volume   Culture   Final    NO GROWTH 5 DAYS Performed at [facility], [address] [place], [place] [address]    Report Status 12/25/2019 FINAL  Final  Respiratory Panel by RT PCR (Flu A&B, Covid) - Nasopharyngeal Swab     Status: None   Collection Time: 12/20/19  4:46 PM   Specimen: Nasopharyngeal Swab  Result Value Ref Range Status   SARS Coronavirus 2 by RT PCR NEGATIVE NEGATIVE Final    Comment: (NOTE) SARS-CoV-2 target nucleic acids are NOT DETECTED. The SARS-CoV-2 RNA is generally detectable in upper respiratoy specimens during the acute phase of infection. The lowest concentration of SARS-CoV-2 viral copies this assay can detect is 131 copies/mL. A negative result does not preclude SARS-Cov-2 infection and should not be used as the sole basis for treatment or other patient management decisions. A negative result may occur with  improper specimen collection/handling, submission of specimen other than nasopharyngeal swab, presence of viral mutation(s) within the areas targeted by this assay, and inadequate number of viral copies (<131  copies/mL). A negative result must be combined with clinical observations, patient history, and epidemiological information. The expected result is Negative. Fact Sheet for Patients:  https://www.moore.com/https://www.fda.gov/media/142436/download Fact Sheet for Healthcare Providers:  https://www.young.biz/https://www.fda.gov/media/142435/download This test is not yet ap proved or cleared by the [place] FDA and  has been authorized for detection and/or diagnosis of SARS-CoV-2 by FDA under an Emergency Use Authorization (EUA). This EUA will remain  in effect (meaning this test can be used) for the duration of the COVID-19 declaration under Section 564(b)(1) of the Act, 21 U.S.C. section 360bbb-3(b)(1), unless the authorization is terminated or revoked sooner.    Influenza A by PCR NEGATIVE NEGATIVE Final   Influenza B by PCR NEGATIVE NEGATIVE Final    Comment: (NOTE) The Xpert Xpress SARS-CoV-2/FLU/RSV assay is intended as an aid in  the diagnosis of influenza from Nasopharyngeal swab specimens and  should not be used as a sole basis for treatment. Nasal washings and  aspirates are unacceptable for Xpert Xpress SARS-CoV-2/FLU/RSV  testing. Fact Sheet for Patients: https://www.moore.com/https://www.fda.gov/media/142436/download Fact Sheet for Healthcare Providers: https://www.young.biz/https://www.fda.gov/media/142435/download This test is not yet approved or cleared by the  [place] [employer] and  has been authorized for detection and/or diagnosis of SARS-CoV-2 by  FDA under an TEFL teacher (EUA). This EUA will remain  in effect (meaning this test can be used) for the duration of the  Covid-19 declaration under Section 564(b)(1) of the Act, 21  U.S.C. section 360bbb-3(b)(1), unless the authorization is  terminated or revoked. Performed at [facility], [address] [place], [place] [address]   Urine Culture     Status: None   Collection Time: 12/20/19  4:53 PM   Specimen: Urine, Random  Result Value Ref Range Status   Specimen  Description URINE, RANDOM  Final   Special Requests NONE  Final   Culture   Final    NO GROWTH Performed at [facility], [address] [place], [place] [address]    Report Status 12/21/2019 FINAL  Final  Blood culture (routine x 2)     Status: None   Collection Time: 12/20/19  5:21 PM   Specimen: BLOOD RIGHT HAND  Result Value Ref Range Status   Specimen Description BLOOD RIGHT HAND  Final   Special Requests   Final    BOTTLES DRAWN AEROBIC AND ANAEROBIC Blood Culture adequate volume   Culture   Final    NO GROWTH 5 DAYS Performed at [facility], [address] [place], [place] [address]    Report Status 12/25/2019 FINAL  Final         Radiology Studies: No results found.      Scheduled Meds: . acetaZOLAMIDE  250 mg Oral BID  . apixaban  5 mg Oral BID  . atorvastatin  10 mg Oral Daily  . divalproex  500 mg Oral BID  . insulin aspart  0-20 Units Subcutaneous TID WC  . mouth rinse  15 mL Mouth Rinse BID  . multivitamin with minerals  1 tablet Oral Daily  . Ensure Max Protein  11 oz Oral Daily  . senna-docusate  1 tablet Oral Daily   Continuous Infusions: . sodium chloride Stopped (12/25/19 1231)  . sodium chloride Stopped (12/26/19 1421)  . ampicillin-sulbactam (UNASYN) IV 3 g (12/27/19 0321)  . vancomycin 200 mL/hr at 12/27/19 0903     LOS: 7 days    Time spent: 40 minutes    Ramiro Harvest, MD [facility]   To contact the attending provider between 7A-7P or the covering provider during after hours 7P-7A, please log into the web site [email or web address] and access using universal [place] password for that web site. If you do not have the password, please call the hospital operator.  12/27/2019, 10:04 AM

## 2019-12-27 NOTE — Progress Notes (Signed)
RN received a [employer] level of 21 per lab.  IV med has been stopped and [facility] MD(Blount) has been notified via Amion of current findings.

## 2019-12-27 NOTE — Progress Notes (Signed)
Pharmacy Antibiotic Note  Becky Hendricks is a 74 y.o. female admitted on 12/20/2019 with AMS and brief cardiac arrest.  Pharmacy has been consulted for vancomycin and ampicllin/sulbactam for osteomyelitis/bascess of the coccyx.  CT this admission showed decrease in size of left buttock fistula and abscess.   Patient afebrile, WBC wnl, Scr stable at 0.47.  Vancomycin trough today was 21, but is not a true trough level since it was drawn two hours early respective to administration of last dose. Patient's true trough level calculates out to be 18.71, which is perfectly therapeutic (Vd 47.65, ke 0.0577).   Vancomycin dose was stopped this AM by RN without contacting pharmacy. Spoke with day-time RN who will restart this morning's dose.   Plan: Vancomycin 1000mg  IV Q12H Amp/sulb 3g Q6 hr Monitor renal fxn, clinical progress, vanc troughs when indicated Abx end date 01/21/20   Height: 5\' 4"  (162.6 cm) Weight: 95.3 kg (210 lb) IBW/kg (Calculated) : 54.7  Temp (24hrs), Avg:97.7 F (36.5 C), Min:97.5 F (36.4 C), Max:98 F (36.7 C)  Recent Labs  Lab 12/20/19 1543 12/20/19 1543 12/20/19 1920 12/20/19 2120 12/21/19 0409 12/23/19 0316 12/24/19 0612 12/25/19 0644 12/26/19 0607 12/27/19 0427  WBC 11.3*  --   --   --    < > 8.6 6.6 5.6 7.6 6.5  CREATININE 0.74   < > 0.61  --    < > 0.39* 0.38* 0.41* 0.41* 0.47  LATICACIDVEN 6.0*  --  3.9* 3.2*  --   --   --   --   --   --   VANCOTROUGH  --   --   --   --   --   --   --   --   --  21*   < > = values in this interval not displayed.    Estimated Creatinine Clearance: 71.1 mL/min (by C-G formula based on SCr of 0.47 mg/dL).    Antimicrobials: Vanc 4/22 >>(6/10) Unasyn 4/28 >>(6/10) Merrem 5/9 >> 5/11  Microbiology: 5/9 covid / flu - negative 5/9 UCx - ngtd 5/9  BCx - ngtd  Data from previous admit: 4/25 VT = 15 >> continue 5/2 VT 8 - adjust to 1g q12 5/6 corrected VT~16 - cont current dose 5/16 correct VT 18.71 - cont current  dose  4/22 BC: 1/3 viridan strep 4/22 UCx >>neg 4/23 L-buttock abscess: rare Staphylococcus warneri, Strep anginosis 4/23 MRSA PCR - positive   5/23, PharmD PGY1 Pharmacy Resident Phone: [phone] 12/27/2019  9:12 AM  Please check [email or web address] for unit-specific pharmacy phone numbers.

## 2019-12-27 NOTE — Progress Notes (Signed)
Consent obtained.  At bedside to place PICC line.  Pt with difficulty moving RA shoulder area, with grunting and moaning with movement, Shoulder stiffness noted. Rash noted posterior of RUA. Upon assessment of LUE, Rash noted full circumference of RUE, elbow to shoulder, with areas more intense than others.  Baird Lyons RN notified and assessed areas.  Baird Lyons to call MD to notify.  VAS Team to reassess 12/28/19 for placement.

## 2019-12-27 NOTE — Progress Notes (Signed)
Notified by IV team RN patient has rash on both upper arms, rash is warm to touch. Patient resting in bed and has no complaints. Vital signs taken and MD notified

## 2019-12-28 LAB — GLUCOSE, CAPILLARY
Glucose-Capillary: 147 mg/dL — ABNORMAL HIGH (ref 70–99)
Glucose-Capillary: 195 mg/dL — ABNORMAL HIGH (ref 70–99)
Glucose-Capillary: 165 mg/dL — ABNORMAL HIGH (ref 70–99)
Glucose-Capillary: 274 mg/dL — ABNORMAL HIGH (ref 70–99)

## 2019-12-28 LAB — BASIC METABOLIC PANEL
Anion gap: 9 (ref 5–15)
BUN: 11 mg/dL (ref 8–23)
CO2: 26 mmol/L (ref 22–32)
Calcium: 8.5 mg/dL — ABNORMAL LOW (ref 8.9–10.3)
Chloride: 105 mmol/L (ref 98–111)
Creatinine, Ser: 0.51 mg/dL (ref 0.44–1.00)
GFR calc Af Amer: >60 mL/min (ref >60)
GFR calc non Af Amer: >60 mL/min (ref >60)
Glucose, Bld: 165 mg/dL — ABNORMAL HIGH (ref 70–99)
Potassium: 3.9 mmol/L (ref 3.5–5.1)
Sodium: 140 mmol/L (ref 135–145)

## 2019-12-28 MED ORDER — PREDNISONE 20 MG PO TABS
20.0000 mg | ORAL_TABLET | Freq: Once | ORAL | Status: AC
  Administered 2019-12-28: 20 mg via ORAL
  Filled 2019-12-28: qty 1

## 2019-12-28 MED ORDER — DIPHENHYDRAMINE HCL 25 MG PO CAPS
25.0000 mg | ORAL_CAPSULE | Freq: Once | ORAL | Status: AC
  Administered 2019-12-28: 25 mg via ORAL
  Filled 2019-12-28: qty 1

## 2019-12-28 MED ORDER — FAMOTIDINE IN NACL 20-0.9 MG/50ML-% IV SOLN
20.0000 mg | Freq: Two times a day (BID) | INTRAVENOUS | Status: AC
  Administered 2019-12-28 – 2019-12-29 (×4): 20 mg via INTRAVENOUS
  Filled 2019-12-28 (×4): qty 50

## 2019-12-28 NOTE — Progress Notes (Signed)
Patient assessed for PICC.  Bilateral upper extremities with breakdown/rash noted.  Dr. Grandville Silos sent securechat requesting that IR be consulted for tunneled line in chest due to the above and increased infection risk if PICC placed.  Tai, RN updated.

## 2019-12-28 NOTE — Progress Notes (Signed)
PROGRESS NOTE    Becky Hendricks  HWE:[id number] DOB: 25-Jun-1946 DOA: 12/20/2019 PCP: Roderic Scarce, MD   Chief Complaint  Patient presents with  . post cpr    Brief Narrative:  Becky Hendricks is a 74 yo F with h/o AF on Eliquis, bipolar disorder, HTN, T2DM, who presented after out of hospital cardiopulmonary arrest. Pt was recently discharged on 12/17/19 stage IV pressure ulcer to be on Unasyn + Vanc until 6/10. On 5/9, staff at the nursing home reported she didn't "look right" at lunch. She was later found in her room agonally breathing without a pulse. She underwent 1-2 minutes of CPR with ROSC. She was intubated in the field and brought to [facility] ED. In the ED, SpO2 100 intubated, BP 120s/90s on epi, blood glucose 300, lactate 6. WBC 11.3 from 10.8 on discharge. ABG was 7.42/64/272/42. Troponin 76 and 161 six hours later. Respiratory panel negative, EKG showed NSR without ischemic changes. She was minimally responsive despite no sedation; she was started on Levophed for BP support and transitioned to Meropenem and admitted to [facility].  Patient subsequently weaned off pressors.  Patient extubated.  Patient on IV antibiotics.  2D echo pending.  Patient transferred to hospitalist team on 12/23/2019.   Assessment & Plan:   Active Problems:   Pressure injury of skin   Respiratory failure with hypoxia (HCC)   Acute metabolic encephalopathy   Severe sepsis (HCC)   Osteomyelitis (HCC)   Abscess of multiple sites of buttock   AF (paroxysmal atrial fibrillation) (HCC)   Cardiac arrest (HCC)   Hyperphosphatemia   Edema of upper extremity  1 cardiopulmonary arrest Patient noted to have had a cardiopulmonary arrest on 12/20/2019 in the nursing home requiring 1 to 2 minutes of CPR before ROSC.  Working diagnosis felt likely secondary to septic shock secondary to stage IV decubitus ulcer with osteomyelitis of the coccyx/sacrum.  Concern for also PE.  Patient noted on admission to be hypotensive requiring pressors  in the setting of osteomyelitis.  Pressors have been weaned off.  It was felt unlikely secondary to PE as patient noted to be compliant with Eliquis.  Chest x-ray done did show some bibasilar infiltrate but repeat showed right pleural effusion not consistent with pneumonia.  Patient with recent 2D echo on 12/08/2019 with a EF of 60 to 65%, no wall motion abnormalities.  Patient with minimally elevated troponin felt consistent with demand ischemia.  Repeat 2D echo with moderately dilated right ventricle with moderately decreased systolic function and new moderate pulmonary hypertension.  EF 65 to 70%.  Left ventricle with normal function.  Grade 2 diastolic dysfunction.  Right ventricular systolic function is moderately reduced.  Right ventricular size moderately enlarged.  Moderately elevated pulmonary artery systolic pressure.  Left atrial size mildly dilated, right atrial size moderately dilated.  With abnormal 2D echo findings, presentation with hypotension and cardiopulmonary arrest and as such CT angiogram chest was done which was negative for PE.  Continue empiric IV antibiotics.  Follow.   2.  Severe sepsis with septic shock secondary to osteomyelitis of the sacrum/coccyx, stage IV pressure ulcer, POA Secondary to osteomyelitis of the sacrum/coccyx.  Patient noted to have a polymicrobial decubitus ulcer with strep viridans bacteremia noted last week.  Patient was treated on IV Unasyn and vancomycin which was discharged on.  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  Stable decubitus ulcer with osteomyelitis involving distal sacrum  and coccyx.  Chest x-ray with some bibasilar infiltrate however repeat chest x-ray showed a right pleural effusion.  Sepsis felt not likely due to a pulmonary infiltrate.  Patient pancultured, blood cultures with no growth to date.  Patient noted to have a midline prior to admission which was removed  on 12/21/2019.  Lactate trending down.  Patient currently off pressors as of 12/22/2019.  Improving clinically.  Continue empiric IV vancomycin IV Unasyn through 01/21/2020 as previously recommended.  Will need new PICC line versus midline prior to discharge once blood cultures have been finalized.  Blood cultures negative x5 days.  PICC line ordered however due to rash on upper extremities will wait another 24 hours prior to placing PICC line to see if rash improves.  Follow.  3.  Acute metabolic encephalopathy Questionable etiology.  Felt could be secondary to patient's acute cardiopulmonary arrest versus acute CVA.  CT head done negative for any acute abnormalities.  EEG done on 12/21/2019 with moderate to severe diffuse encephalopathy, nonspecific etiology, no seizures or epileptiform discharges noted.  Patient alert to self.  Patient thinks she is in the nursing home.  Patient thinks is in the 40s.  Patient thinks it is January.  Patient however answering questions appropriately.  It was noted that patient did have encephalopathy versus chronic cognitive deficit on prior hospitalization (4/22-5/6) and may be patient's new baseline.  TSH noted to be 2.570.  Vitamin B12 at 363.  HIV nonreactive.  RPR nonreactive.  Patient with some right upper extremity weakness which patient's brother attributes to process of transportation to the hospital when patient was found down and likely secondary to damage to ligaments.  MRI head which was done was negative for any acute abnormalities.  Supportive care.  4.  Stage IV decubitus pressure ulcer/osteomyelitis of the sacrum and coccyx, POA Patient discharged on 12/17/2019 on Unasyn and vancomycin through 01/21/2020.  Wound care consulted.  Continue empiric IV antibiotics.  Follow.  5.  Paroxysmal atrial fibrillation Currently rate controlled.  Eliquis for anticoagulation.   6.  Bipolar disorder Continue Depakote.  7.  Diabetes mellitus type 2 Hemoglobin A1c 7.8  12/03/2019.  CBG of 147 this morning.  Patient seen by speech therapist and currently on a regular diet.  Continue sliding scale insulin.  8.  Hyperphosphatemia Patient was started on calcium binders.  Repeat phosphorus at 3.0.  9.  Bilateral upper extremity edema Likely secondary to aggressive fluid hydration on presentation.  Patient is +5.5 L.  2D echo with a normal EF, grade 2 diastolic dysfunction.  Patient received a dose of Lasix 20 mg IV x1 12/24/2019 with a urine output of 1.8 L.  Patient has been receiving Lasix 20 mg IV every 12 hours for the past 4 days and now with a urine output of 3.1 L over the past 24 hours. Strict I's and O's.  Daily weights.    10.  Hypomagnesemia Magnesium at 2.0.  Follow.   11. Rash Per RN patient noted to have a rash yesterday evening bilateral upper extremities.  Patient given dose of IV Benadryl yesterday evening with some improvement with rash.  Pepcid 20 mg IV every 12 hours.  Oral Benadryl 25 mg p.o. x1.  Prednisone 20 mg p.o. x1.  Follow.  DVT prophylaxis: Eliquis Code Status: Full Family Communication: Updated patient.  No family at bedside. Disposition:   Status is: Inpatient    Dispo: The patient is from: SNF  Anticipated d/c is to: Likely SNF.               Anticipated d/c date is: Hopefully Monday, 12/28/2019              Patient recently off pressors, on empiric IV antibiotics, patient with right upper extremity weakness and swelling receiving IV Lasix.we will need a PICC line prior to discharge.  Patient not ready for discharge.        Consultants:   Admitted to PCCM    Procedures:  5/9: CT Head wo contrast: 1. No acute intracranial pathology. 2. Age-related atrophy and chronic microvascular ischemic changes.  5/9: CT Abd Pelvis w Contrast: 1. 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 2. Stable decubitus ulcer with osteomyelitis involving distal sacrum and  coccyx. 3. Cholelithiasis. No radiographic evidence of cholecystitis. 4. Stable small uterine fibroids. 5. Increased small right pleural effusion and bibasilar atelectasis.  5/9: CXR: 1. Left internal jugular catheter coiled over the left subclavian vein, with tip normally positioned over the superior vena cava. Please correlate with catheter function. 2. Increasing right basilar consolidation and/or effusion.  5/10 EEG: moderate to severe diffuse encephalopathy, nonspecific etiology.No seizures ordefiniteepileptiform discharges were seen throughout the recording.  2D echo 12/23/2019  12/20/2019 ETT>>>> 12/22/2019  CT angiogram chest 12/24/2019 MRI head 12/24/2019    Antimicrobials:  4/22 - 4/28: Vanc + Cefepime + lagyl  4/28 - 5/9: Vanc + Unasyn (to be continued until 01/21/20) 5/9 - 5/10: Vanc + Meropenem            5/11 - present: Vanc + Unasyn   Micro Data:  BC x2 NGTD Urine culture NG  Respiratory panel: negative SARS-CoV-2/Influenza A/Influenza B    Subjective: Patient eating breakfast.  Denies any chest pain or shortness of breath.  No abdominal pain.   Objective: Vitals:   12/27/19 1204 12/27/19 1856 12/27/19 2059 12/28/19 0318  BP: (!) 153/97 128/76 (!) 170/80 (!) 146/85  Pulse: 88 90 74 80  Resp: 18 18 19 19   Temp: 98.2 F (36.8 C) 98.4 F (36.9 C) 98.7 F (37.1 C)   TempSrc: Oral Oral Oral   SpO2: 100% 95% 96% 98%  Weight:    94.3 kg  Height:        Intake/Output Summary (Last 24 hours) at 12/28/2019 0839 Last data filed at 12/28/2019 12/30/2019 Gross per 24 hour  Intake 580 ml  Output 3100 ml  Net -2520 ml   Filed Weights   12/26/19 0446 12/27/19 0322 12/28/19 0318  Weight: 96.5 kg 95.3 kg 94.3 kg    Examination:  General exam: NAD.   Respiratory system: Decreased breath sounds in the bases.  No rhonchi.  No crackles.  No wheezing.  Normal respiratory effort. Cardiovascular system: Regular rate rhythm no murmurs rubs or gallops.  No JVD.  No  lower extremity edema. Gastrointestinal system: Abdomen is soft, nontender, nondistended, positive bowel sounds.  No rebound.  No guarding.    Central nervous system: Alert and oriented to self only.  Right upper extremity weakness.  Extremities: Bilateral upper extremity swelling decreased.  Right upper extremity weakness. Skin: Bilateral upper extremities with improving rash.  Psychiatry: Judgement and insight appear poor to fair. Mood & affect appropriate.     Data Reviewed: I have personally reviewed following labs and imaging studies  CBC: Recent Labs  Lab 12/23/19 0316 12/24/19 0612 12/25/19 0644 12/26/19 0607 12/27/19 0427  WBC 8.6 6.6 5.6 7.6 6.5  NEUTROABS 4.8 3.2 2.5 3.3 3.2  HGB 8.5* 8.6* 8.1* 9.4* 10.1*  HCT 29.0* 29.1* 28.4* 32.4* 34.6*  MCV 97.6 98.3 98.6 97.0 96.4  PLT 128* 124* 128* 159 171    Basic Metabolic Panel: Recent Labs  Lab 12/21/19 1931 12/22/19 0329 12/24/19 0612 12/25/19 0644 12/26/19 0607 12/27/19 0427 12/28/19 0516  NA 138   < > 140 139 139 141 140  K 3.6   < > 4.3 3.7 3.6 3.3* 3.9  CL 91*   < > 98 96* 94* 100 105  CO2 33*   < > 35* 35* 36* 33* 26  GLUCOSE 191*   < > 112* 148* 151* 142* 165*  BUN 20   < > 5* 7* 6* 7* 11  CREATININE 0.43*   < > 0.38* 0.41* 0.41* 0.47 0.51  CALCIUM 8.0*   < > 8.0* 8.0* 8.3* 8.7* 8.5*  MG  --   --   --  1.5* 2.0  --   --   PHOS 5.3*  --  3.0  3.0  --   --   --   --    < > = values in this interval not displayed.    GFR: Estimated Creatinine Clearance: 70.7 mL/min (by C-G formula based on SCr of 0.51 mg/dL).  Liver Function Tests: Recent Labs  Lab 12/23/19 0316 12/24/19 0612  AST 13*  --   ALT 18  --   ALKPHOS 58  --   BILITOT 0.7  --   PROT 4.7*  --   ALBUMIN 1.7* 1.7*    CBG: Recent Labs  Lab 12/27/19 0617 12/27/19 1130 12/27/19 1612 12/27/19 2129 12/28/19 0622  GLUCAP 125* 144* 175* 209* 147*     Recent Results (from the past 240 hour(s))  Blood culture (routine x 2)      Status: None   Collection Time: 12/20/19  3:59 PM   Specimen: BLOOD  Result Value Ref Range Status   Specimen Description BLOOD RIGHT ANTECUBITAL  Final   Special Requests   Final    BOTTLES DRAWN AEROBIC AND ANAEROBIC Blood Culture adequate volume   Culture   Final    NO GROWTH 5 DAYS Performed at [facility], [address], [place], [place] [address]    Report Status 12/25/2019 FINAL  Final  Respiratory Panel by RT PCR (Flu A&B, Covid) - Nasopharyngeal Swab     Status: None   Collection Time: 12/20/19  4:46 PM   Specimen: Nasopharyngeal Swab  Result Value Ref Range Status   SARS Coronavirus 2 by RT PCR NEGATIVE NEGATIVE Final    Comment: (NOTE) SARS-CoV-2 target nucleic acids are NOT DETECTED. The SARS-CoV-2 RNA is generally detectable in upper respiratoy specimens during the acute phase of infection. The lowest concentration of SARS-CoV-2 viral copies this assay can detect is 131 copies/mL. A negative result does not preclude SARS-Cov-2 infection and should not be used as the sole basis for treatment or other patient management decisions. A negative result may occur with  improper specimen collection/handling, submission of specimen other than nasopharyngeal swab, presence of viral mutation(s) within the areas targeted by this assay, and inadequate number of viral copies (<131 copies/mL). A negative result must be combined with clinical observations, patient history, and epidemiological information. The expected result is Negative. Fact Sheet for Patients:  https://www.moore.com/ Fact Sheet for Healthcare Providers:  https://www.young.biz/ This test is not yet ap proved or cleared by the [place] and  has been authorized  for detection and/or diagnosis of SARS-CoV-2 by FDA under an Emergency Use Authorization (EUA). This EUA will remain  in effect (meaning this test can be used) for the duration of the COVID-19  declaration under Section 564(b)(1) of the Act, 21 U.S.C. section 360bbb-3(b)(1), unless the authorization is terminated or revoked sooner.    Influenza A by PCR NEGATIVE NEGATIVE Final   Influenza B by PCR NEGATIVE NEGATIVE Final    Comment: (NOTE) The Xpert Xpress SARS-CoV-2/FLU/RSV assay is intended as an aid in  the diagnosis of influenza from Nasopharyngeal swab specimens and  should not be used as a sole basis for treatment. Nasal washings and  aspirates are unacceptable for Xpert Xpress SARS-CoV-2/FLU/RSV  testing. Fact Sheet for Patients: https://www.moore.com/https://www.fda.gov/media/142436/download Fact Sheet for Healthcare Providers: https://www.young.biz/https://www.fda.gov/media/142435/download This test is not yet approved or cleared by the [place] FDA and  has been authorized for detection and/or diagnosis of SARS-CoV-2 by  FDA under an Emergency Use Authorization (EUA). This EUA will remain  in effect (meaning this test can be used) for the duration of the  Covid-19 declaration under Section 564(b)(1) of the Act, 21  U.S.C. section 360bbb-3(b)(1), unless the authorization is  terminated or revoked. Performed at [facility], [address] [place], [place] [address]   Urine Culture     Status: None   Collection Time: 12/20/19  4:53 PM   Specimen: Urine, Random  Result Value Ref Range Status   Specimen Description URINE, RANDOM  Final   Special Requests NONE  Final   Culture   Final    NO GROWTH Performed at [facility], [address] [place], [place] [address]    Report Status 12/21/2019 FINAL  Final  Blood culture (routine x 2)     Status: None   Collection Time: 12/20/19  5:21 PM   Specimen: BLOOD RIGHT HAND  Result Value Ref Range Status   Specimen Description BLOOD RIGHT HAND  Final   Special Requests   Final    BOTTLES DRAWN AEROBIC AND ANAEROBIC Blood Culture adequate volume   Culture   Final    NO GROWTH 5 DAYS Performed at [facility], [address]  [place], [place] [address]    Report Status 12/25/2019 FINAL  Final         Radiology Studies: US EKG SITE RITE  Result Date: 12/27/2019 If Site Rite image not attached, placement could not be confirmed due to current cardiac rhythm.       Scheduled Meds: . acetaZOLAMIDE  250 mg Oral BID  . apixaban  5 mg Oral BID  . atorvastatin  10 mg Oral Daily  . divalproex  500 mg Oral BID  . insulin aspart  0-20 Units Subcutaneous TID WC  . mouth rinse  15 mL Mouth Rinse BID  . multivitamin with minerals  1 tablet Oral Daily  . Ensure Max Protein  11 oz Oral Daily  . senna-docusate  1 tablet Oral Daily   Continuous Infusions: . sodium chloride Stopped (12/25/19 1231)  . sodium chloride Stopped (12/26/19 1421)  . ampicillin-sulbactam (UNASYN) IV 3 g (12/28/19 0625)  . vancomycin 1,000 mg (12/28/19 0701)     LOS: 8 days    Time spent: 40 minutes    Ramiro Harvestaniel Mikaeel Petrow, MD [facility]   To contact the attending provider between 7A-7P or the covering provider during after hours 7P-7A, please log into the web site [email or web address] and access using universal [place] password for  that web site. If you do not have the password, please call the hospital operator.  12/28/2019, 8:39 AM

## 2019-12-28 NOTE — Progress Notes (Signed)
Per Dr. Janee Morn, please reassess arms for PICC 5/18.

## 2019-12-29 ENCOUNTER — Inpatient Hospital Stay (HOSPITAL_COMMUNITY): Payer: Medicare Other

## 2019-12-29 DIAGNOSIS — R21 Rash and other nonspecific skin eruption: Secondary | ICD-10-CM

## 2019-12-29 LAB — BASIC METABOLIC PANEL
Anion gap: 8 (ref 5–15)
BUN: 10 mg/dL (ref 8–23)
CO2: 27 mmol/L (ref 22–32)
Calcium: 8.6 mg/dL — ABNORMAL LOW (ref 8.9–10.3)
Chloride: 107 mmol/L (ref 98–111)
Creatinine, Ser: 0.47 mg/dL (ref 0.44–1.00)
GFR calc Af Amer: >60 mL/min (ref >60)
GFR calc non Af Amer: >60 mL/min (ref >60)
Glucose, Bld: 182 mg/dL — ABNORMAL HIGH (ref 70–99)
Potassium: 3.6 mmol/L (ref 3.5–5.1)
Sodium: 142 mmol/L (ref 135–145)

## 2019-12-29 LAB — BRAIN NATRIURETIC PEPTIDE: B Natriuretic Peptide: 183.9 pg/mL — ABNORMAL HIGH (ref 0.0–100.0)

## 2019-12-29 LAB — CBC WITH DIFFERENTIAL/PLATELET
Abs Immature Granulocytes: 0.04 10*3/uL (ref 0.00–0.07)
Basophils Absolute: 0 10*3/uL (ref 0.0–0.1)
Basophils Relative: 0 %
Eosinophils Absolute: 0.3 10*3/uL (ref 0.0–0.5)
Eosinophils Relative: 4 %
HCT: 35.3 % — ABNORMAL LOW (ref 36.0–46.0)
Hemoglobin: 10.5 g/dL — ABNORMAL LOW (ref 12.0–15.0)
Immature Granulocytes: 1 %
Lymphocytes Relative: 28 %
Lymphs Abs: 1.9 10*3/uL (ref 0.7–4.0)
MCH: 28.9 pg (ref 26.0–34.0)
MCHC: 29.7 g/dL — ABNORMAL LOW (ref 30.0–36.0)
MCV: 97.2 fL (ref 80.0–100.0)
Monocytes Absolute: 0.6 10*3/uL (ref 0.1–1.0)
Monocytes Relative: 8 %
Neutro Abs: 3.8 10*3/uL (ref 1.7–7.7)
Neutrophils Relative %: 59 %
Platelets: 177 10*3/uL (ref 150–400)
RBC: 3.63 MIL/uL — ABNORMAL LOW (ref 3.87–5.11)
RDW: 19.7 % — ABNORMAL HIGH (ref 11.5–15.5)
WBC: 6.5 10*3/uL (ref 4.0–10.5)
nRBC: 0 % (ref 0.0–0.2)

## 2019-12-29 LAB — GLUCOSE, CAPILLARY
Glucose-Capillary: 250 mg/dL — ABNORMAL HIGH (ref 70–99)
Glucose-Capillary: 116 mg/dL — ABNORMAL HIGH (ref 70–99)
Glucose-Capillary: 166 mg/dL — ABNORMAL HIGH (ref 70–99)
Glucose-Capillary: 258 mg/dL — ABNORMAL HIGH (ref 70–99)

## 2019-12-29 LAB — HEPATIC FUNCTION PANEL
ALT: 19 U/L (ref 0–44)
AST: 22 U/L (ref 15–41)
Albumin: 2.1 g/dL — ABNORMAL LOW (ref 3.5–5.0)
Alkaline Phosphatase: 76 U/L (ref 38–126)
Bilirubin, Direct: <0.1 mg/dL (ref 0.0–0.2)
Total Bilirubin: 0.4 mg/dL (ref 0.3–1.2)
Total Protein: 6 g/dL — ABNORMAL LOW (ref 6.5–8.1)

## 2019-12-29 LAB — VALPROIC ACID LEVEL: Valproic Acid Lvl: 30 ug/mL — ABNORMAL LOW (ref 50.0–100.0)

## 2019-12-29 IMAGING — DX DG CHEST 1V PORT
1 series · 1 of 1 positions shown · non-contrast
Comparison: [DATE]

CLINICAL DATA: Shortness of breath

EXAM:
PORTABLE CHEST 1 VIEW

[chest ap]
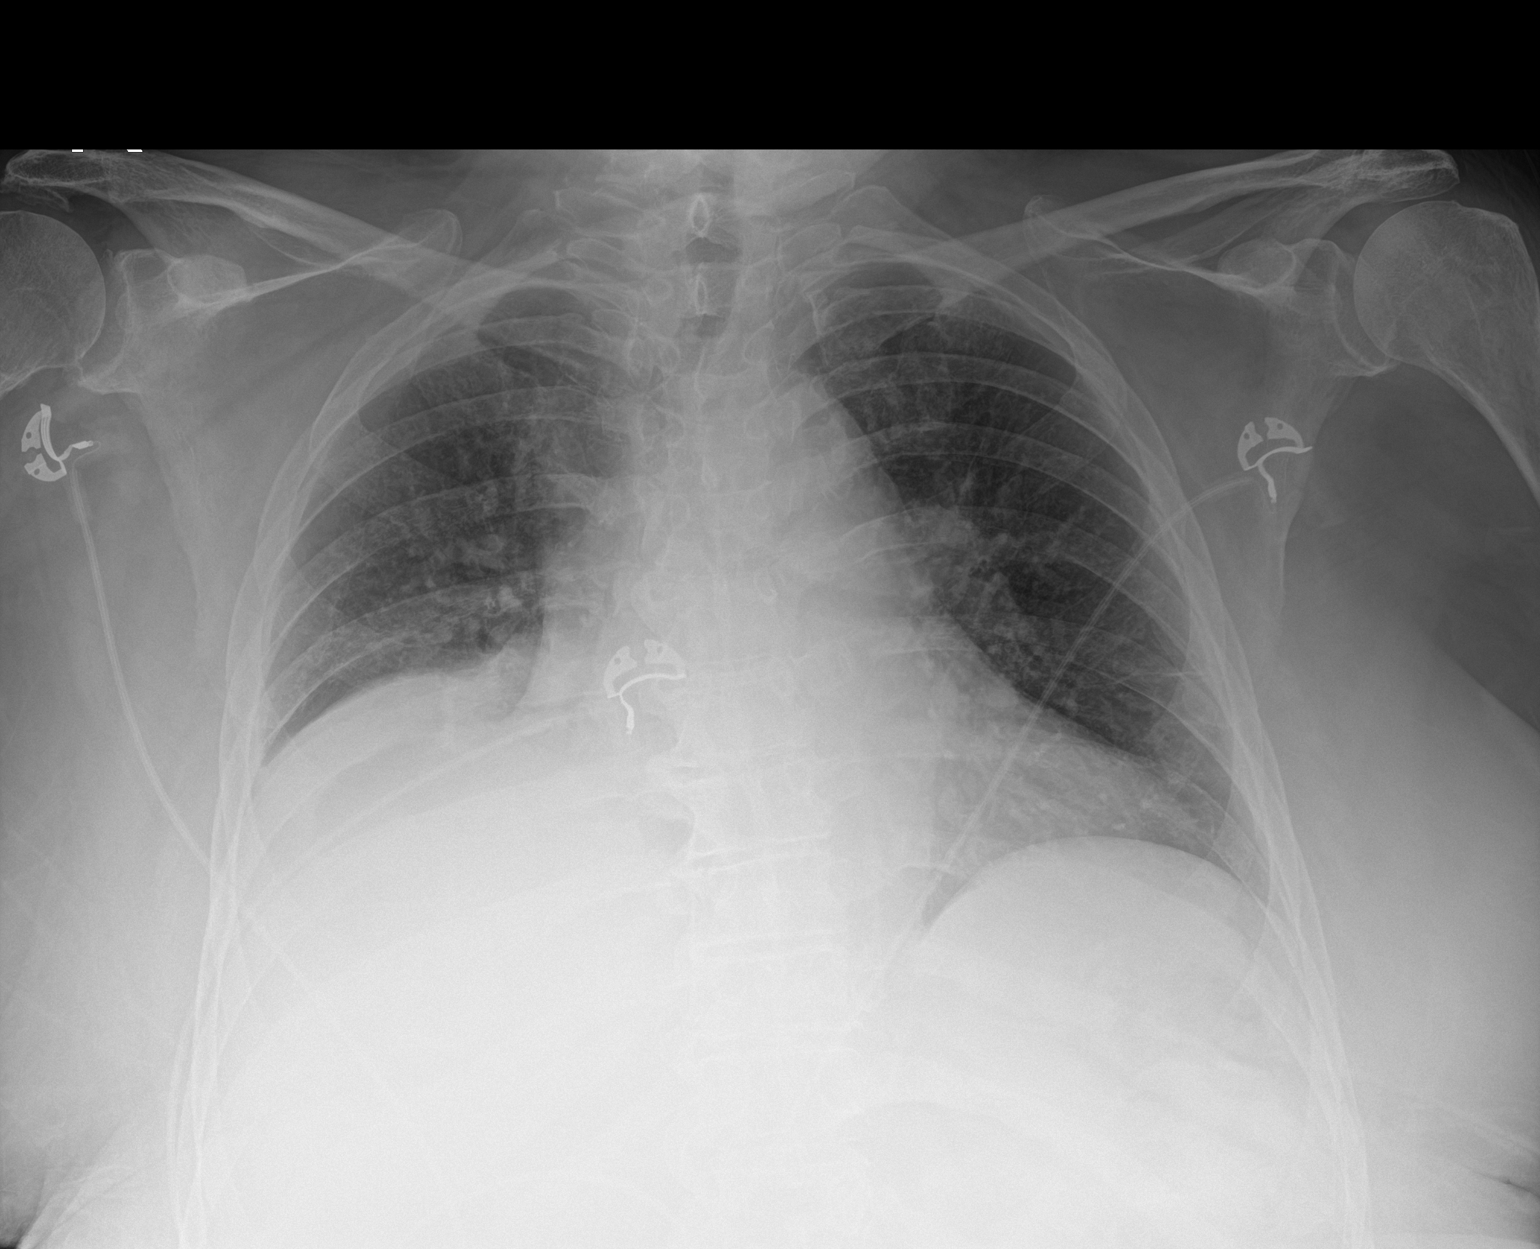

[1 of 1 positions shown; findings below may reference images not displayed]

FINDINGS: Interval extubation. Enteric tube is no longer present. Central line
is no longer present. Low lung volumes with persistent elevation of
the right hemidiaphragm. Probable persistent right pleural effusion
and right basilar atelectasis. Stable cardiomediastinal contours.
IMPRESSION: Persistent elevation of the right hemidiaphragm with probable right
pleural effusion and right basilar atelectasis.

## 2019-12-29 MED ORDER — INSULIN GLARGINE 100 UNIT/ML ~~LOC~~ SOLN
5.0000 [IU] | SUBCUTANEOUS | Status: DC
  Administered 2019-12-29 – 2020-01-01 (×4): 5 [IU] via SUBCUTANEOUS
  Filled 2019-12-29 (×5): qty 0.05

## 2019-12-29 MED ORDER — DIPHENHYDRAMINE HCL 12.5 MG/5ML PO ELIX
12.5000 mg | ORAL_SOLUTION | Freq: Two times a day (BID) | ORAL | Status: DC
  Filled 2019-12-29 (×2): qty 5

## 2019-12-29 MED ORDER — PREDNISONE 20 MG PO TABS
20.0000 mg | ORAL_TABLET | Freq: Every day | ORAL | Status: AC
  Administered 2019-12-30 – 2020-01-01 (×3): 20 mg via ORAL
  Filled 2019-12-29 (×3): qty 1

## 2019-12-29 MED ORDER — FUROSEMIDE 10 MG/ML IJ SOLN
20.0000 mg | Freq: Two times a day (BID) | INTRAMUSCULAR | Status: AC
  Administered 2019-12-29 (×2): 20 mg via INTRAVENOUS
  Filled 2019-12-29 (×2): qty 2

## 2019-12-29 MED ORDER — DIPHENHYDRAMINE HCL 25 MG PO CAPS: 25.0000 mg | ORAL_CAPSULE | Freq: Two times a day (BID) | ORAL | Status: DC

## 2019-12-29 NOTE — Progress Notes (Signed)
Physical Therapy Treatment Patient Details Name: Becky Hendricks MRN: [id number] DOB: 1946-02-22 Today's Date: 12/29/2019    History of Present Illness 74 Y/o with PMH of afib, bipolar, HTN, Obesity, DM, Pressure ulcer with recent admission for left buttock abscess s.p drainage brought to ED after arrest requiring brief CPR at nursing home.  pt was intubated in the field.  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  Pt extubated 5/11.    PT Comments    Patient agreeable to PT session. Following direction, but unable to provide mobility assistance. Requires multi modal cues for mobility and to try to assist. Not oriented to time. She continues to require Max +2 assist for all bed mobility at this time. Assist needed for LE exercises. She will continue to benefit from skilled PT while here to attempt to improve functional independence.      Follow Up Recommendations  SNF     Equipment Recommendations  None recommended by PT    Recommendations for Other Services       Precautions / Restrictions Precautions Precautions: Fall Precaution Comments: sacral wound, stage IV with osteo Restrictions Weight Bearing Restrictions: No    Mobility  Bed Mobility Overal bed mobility: Needs Assistance Bed Mobility: Supine to Sit;Sit to Supine     Supine to sit: Total assist;+2 for physical assistance Sit to supine: Total assist;+2 for physical assistance   General bed mobility comments: She agrees to mobility, but then does not follow through with assistance. She requires total assist for all bed mobility. Strong posterior lean with sitting.  Transfers                 General transfer comment: unable. Will need lift  Ambulation/Gait             General Gait Details: unable   Stairs             Wheelchair Mobility    Modified Rankin (Stroke Patients Only)       Balance Overall  balance assessment: Needs assistance Sitting-balance support: Feet supported Sitting balance-Leahy Scale: Zero                                      Cognition Arousal/Alertness: Awake/alert Behavior During Therapy: Flat affect Overall Cognitive Status: No family/caregiver present to determine baseline cognitive functioning Area of Impairment: Orientation;Attention;Memory;Following commands;Safety/judgement;Awareness;Problem solving                 Orientation Level: Disoriented to;Place;Time;Situation Current Attention Level: Sustained Memory: Decreased recall of precautions;Decreased short-term memory Following Commands: Follows one step commands inconsistently;Follows one step commands with increased time Safety/Judgement: Decreased awareness of safety;Decreased awareness of deficits Awareness: Intellectual Problem Solving: Slow processing;Decreased initiation;Difficulty sequencing;Requires verbal cues;Requires tactile cues General Comments: Patient agrees to move, but does not follow through. She will follow commands inconsistently      Exercises Other Exercises Other Exercises: LE exercises with assist: AP, hip abd/add, heel slides x 10 reps    General Comments        Pertinent Vitals/Pain Pain Assessment: No/denies pain    Home Living                      Prior Function            PT Goals (current goals can now be found in the care  plan section) Acute Rehab PT Goals Patient Stated Goal: to not be sick anymore PT Goal Formulation: With patient Time For Goal Achievement: 01/06/20 Potential to Achieve Goals: Fair Progress towards PT goals: Not progressing toward goals - comment(continues to require total assist +2 for mobility)    Frequency    Min 2X/week      PT Plan Current plan remains appropriate    Co-evaluation PT/OT/SLP Co-Evaluation/Treatment: Yes Reason for Co-Treatment: For patient/therapist safety;To address  functional/ADL transfers PT goals addressed during session: Mobility/safety with mobility        AM-PAC PT "6 Clicks" Mobility   Outcome Measure  Help needed turning from your back to your side while in a flat bed without using bedrails?: Total Help needed moving from lying on your back to sitting on the side of a flat bed without using bedrails?: Total Help needed moving to and from a bed to a chair (including a wheelchair)?: Total Help needed standing up from a chair using your arms (e.g., wheelchair or bedside chair)?: Total Help needed to walk in hospital room?: Total Help needed climbing 3-5 steps with a railing? : Total 6 Click Score: 6    End of Session   Activity Tolerance: Patient limited by fatigue Patient left: in bed;with bed alarm set Nurse Communication: Mobility status PT Visit Diagnosis: Other abnormalities of gait and mobility (R26.89);Muscle weakness (generalized) (M62.81)     Time: 6195-0932 PT Time Calculation (min) (ACUTE ONLY): 17 min  Charges:  $Therapeutic Activity: 8-22 mins                     Shakisha Abend, PT, GCS 12/29/19,12:13 PM

## 2019-12-29 NOTE — Progress Notes (Addendum)
PROGRESS NOTE    Becky Hendricks  HWE:[id number] DOB: 25-Jun-1946 DOA: 12/20/2019 PCP: Roderic Scarce, MD   Chief Complaint  Patient presents with  . post cpr    Brief Narrative:  [name] is a 74 yo F with h/o AF on Eliquis, bipolar disorder, HTN, T2DM, who presented after out of hospital cardiopulmonary arrest. Pt was recently discharged on 12/17/19 stage IV pressure ulcer to be on Unasyn + Vanc until 6/10. On 5/9, staff at the nursing home reported she didn't "look right" at lunch. She was later found in her room agonally breathing without a pulse. She underwent 1-2 minutes of CPR with ROSC. She was intubated in the field and brought to [facility] ED. In the ED, SpO2 100 intubated, BP 120s/90s on epi, blood glucose 300, lactate 6. WBC 11.3 from 10.8 on discharge. ABG was 7.42/64/272/42. Troponin 76 and 161 six hours later. Respiratory panel negative, EKG showed NSR without ischemic changes. She was minimally responsive despite no sedation; she was started on Levophed for BP support and transitioned to Meropenem and admitted to [facility].  Patient subsequently weaned off pressors.  Patient extubated.  Patient on IV antibiotics.  2D echo pending.  Patient transferred to hospitalist team on 12/23/2019.   Assessment & Plan:   Active Problems:   Pressure injury of skin   Respiratory failure with hypoxia (HCC)   Acute metabolic encephalopathy   Severe sepsis (HCC)   Osteomyelitis (HCC)   Abscess of multiple sites of buttock   AF (paroxysmal atrial fibrillation) (HCC)   Cardiac arrest (HCC)   Hyperphosphatemia   Edema of upper extremity  1 cardiopulmonary arrest Patient noted to have had a cardiopulmonary arrest on 12/20/2019 in the nursing home requiring 1 to 2 minutes of CPR before ROSC.  Working diagnosis felt likely secondary to septic shock secondary to stage IV decubitus ulcer with osteomyelitis of the coccyx/sacrum.  Concern for also PE.  Patient noted on admission to be hypotensive requiring pressors  in the setting of osteomyelitis.  Pressors have been weaned off.  It was felt unlikely secondary to PE as patient noted to be compliant with Eliquis.  Chest x-ray done did show some bibasilar infiltrate but repeat showed right pleural effusion not consistent with pneumonia.  Patient with recent 2D echo on 12/08/2019 with a EF of 60 to 65%, no wall motion abnormalities.  Patient with minimally elevated troponin felt consistent with demand ischemia.  Repeat 2D echo with moderately dilated right ventricle with moderately decreased systolic function and new moderate pulmonary hypertension.  EF 65 to 70%.  Left ventricle with normal function.  Grade 2 diastolic dysfunction.  Right ventricular systolic function is moderately reduced.  Right ventricular size moderately enlarged.  Moderately elevated pulmonary artery systolic pressure.  Left atrial size mildly dilated, right atrial size moderately dilated.  With abnormal 2D echo findings, presentation with hypotension and cardiopulmonary arrest and as such CT angiogram chest was done which was negative for PE.  Continue empiric IV antibiotics.  Follow.   2.  Severe sepsis with septic shock secondary to osteomyelitis of the sacrum/coccyx, stage IV pressure ulcer, POA Secondary to osteomyelitis of the sacrum/coccyx.  Patient noted to have a polymicrobial decubitus ulcer with strep viridans bacteremia noted last week.  Patient was treated on IV Unasyn and vancomycin which was discharged on.  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  Stable decubitus ulcer with osteomyelitis involving distal sacrum  and coccyx.  Chest x-ray with some bibasilar infiltrate however repeat chest x-ray showed a right pleural effusion.  Sepsis felt not likely due to a pulmonary infiltrate.  Patient pancultured, blood cultures with no growth to date.  Patient noted to have a midline prior to admission which was removed  on 12/21/2019.  Lactate trending down.  Patient currently off pressors as of 12/22/2019.  Improving clinically.  Continue empiric IV vancomycin IV Unasyn through 01/21/2020 as previously recommended.  Will need new PICC line versus midline prior to discharge once blood cultures have been finalized.  Blood cultures negative x5 days.  PICC line ordered however due to rash on upper extremities PICC team hesitant to place PICC line and recommending IR evaluation for line placement in the upper chest area due to concerns for infection.  We will hold off on IR evaluation at this time pending ID evaluation to see whether this might be a drug rash and if that is the case antibiotics will need to be adjusted.  Follow for now.  3.  Acute metabolic encephalopathy Questionable etiology.  Felt could be secondary to patient's acute cardiopulmonary arrest versus acute CVA.  CT head done negative for any acute abnormalities.  EEG done on 12/21/2019 with moderate to severe diffuse encephalopathy, nonspecific etiology, no seizures or epileptiform discharges noted.  Patient alert to self.  Patient thinks she is in the nursing home.  Patient thinks is in the [id number].  Patient thinks it is January.  Patient however answering questions appropriately.  It was noted that patient did have encephalopathy versus chronic cognitive deficit on prior hospitalization (4/22-5/6) and may be patient's new baseline.  TSH noted to be 2.570.  Vitamin B12 at 363.  HIV nonreactive.  RPR nonreactive.  Patient with some right upper extremity weakness which patient's brother attributes to process of transportation to the hospital when patient was found down and likely secondary to damage to ligaments.  MRI head which was done was negative for any acute abnormalities.  Supportive care.  4.  Stage IV decubitus pressure ulcer/osteomyelitis of the sacrum and coccyx, POA Patient discharged on 12/17/2019 on Unasyn and vancomycin through 01/21/2020.  Wound care  consulted.  Continue empiric IV antibiotics.  Patient now with diffuse maculopapular rash.  Concern for drug rash.  Consult with ID for further evaluation antibiotics may need to be adjusted however will defer until patient has been seen by ID.  Follow.  5.  Paroxysmal atrial fibrillation Rate controlled.  Eliquis for anticoagulation.   6.  Bipolar disorder Depakote.   7.  Diabetes mellitus type 2 Hemoglobin A1c 7.8 12/03/2019.  CBG of 250 this morning.  Patient seen by speech therapist and currently on a carb modified diet.  Start Lantus 5 units daily.  Continue sliding scale insulin.  8.  Hyperphosphatemia Patient was started on calcium binders.  Repeat phosphorus at 3.0.  9.  Bilateral upper extremity edema Likely secondary to aggressive fluid hydration on presentation.  Patient was +5.5 L.  2D echo with a normal EF, grade 2 diastolic dysfunction.  Patient received a dose of Lasix 20 mg IV x1 12/24/2019 with a urine output of 1.8 L.  Patient has been receiving Lasix 20 mg IV every 12 hours for the past 4 days and now with a urine output of 1.1 L over the past 24 hours.  We will give another dose of Lasix 20 mg IV every 12 hours x2 doses as patient with complaints of shortness of breath.  Strict  I's and O's.  Daily weights.    10.  Hypomagnesemia Magnesium at 2.0.  Follow.   11.  Maculopapular rash Per RN patient noted to have a rash which started supposedly on 12/27/2019 involving bilateral upper extremities.  Patient with a maculopapular rash also on the right flank abdominal region as well as the left neck region.  Patient received some IV Benadryl, Pepcid, a dose of prednisone with some improvement with rash per RN.  Concerned this might be a drug rash.  We will have ID reassess and if indeed is a drug rash ID to recommend what antibiotics to change patient to.  Continue IV Pepcid twice daily x1 more day, will give another dose of oral prednisone 20 mg daily x3 days, Benadryl p.o. twice daily  orally.  Supportive care.  Follow.  12.  Shortness of breath Patient complains of shortness of breath.  CT angiogram chest done a few days ago with some bilateral pleural effusions.  Patient also noted to have been volume overloaded on examination.  We will give Lasix 20 mg IV every 12 hours.  Repeat chest x-ray.  Follow.  DVT prophylaxis: Eliquis Code Status: Full Family Communication: Updated patient.  Updated brother Jamyah Folk via telephone.  Disposition:   Status is: Inpatient    Dispo: The patient is from: SNF              Anticipated d/c is to: Likely SNF.               Anticipated d/c date is: Hopefully Monday, 12/28/2019              Patient recently off pressors, on empiric IV antibiotics, patient with right upper extremity weakness and swelling receiving IV Lasix.patient now with a rash, concern for drug rash, ID to reevaluate.  Will need line per IR for antibiotics on discharge.  Patient not ready for discharge.        Consultants:   Admitted to PCCM    Procedures:  5/9: CT Head wo contrast: 1. No acute intracranial pathology. 2. Age-related atrophy and chronic microvascular ischemic changes.  5/9: CT Abd Pelvis w Contrast: 1. 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 2. Stable decubitus ulcer with osteomyelitis involving distal sacrum and coccyx. 3. Cholelithiasis. No radiographic evidence of cholecystitis. 4. Stable small uterine fibroids. 5. Increased small right pleural effusion and bibasilar atelectasis.  5/9: CXR: 1. Left internal jugular catheter coiled over the left subclavian vein, with tip normally positioned over the superior vena cava. Please correlate with catheter function. 2. Increasing right basilar consolidation and/or effusion.  5/10 EEG: moderate to severe diffuse encephalopathy, nonspecific etiology.No seizures ordefiniteepileptiform discharges were seen throughout the recording.  2D  echo 12/23/2019  12/20/2019 ETT>>>> 12/22/2019  CT angiogram chest 12/24/2019 MRI head 12/24/2019    Antimicrobials:  4/22 - 4/28: Vanc + Cefepime + lagyl  4/28 - 5/9: Vanc + Unasyn (to be continued until 01/21/20) 5/9 - 5/10: Vanc + Meropenem            5/11 - present: Vanc + Unasyn   Micro Data:  BC x2 NGTD Urine culture NG  Respiratory panel: negative SARS-CoV-2/Influenza A/Influenza B    Subjective: Patient laying in bed.  Patient with some complaints of shortness of breath.  Denies any chest pain.  No abdominal pain.  Per RN some improvement with rash over the past 24 hours.  Objective: Vitals:   12/28/19 1040 12/28/19 2145 12/29/19  0032 12/29/19 0456  BP: 126/78 (!) 152/83 133/67 (!) 183/89  Pulse: 77 92 71 83  Resp: 16 18 20 20   Temp: 98.3 F (36.8 C) (!) 97.2 F (36.2 C) 98.3 F (36.8 C) (!) 97.4 F (36.3 C)  TempSrc: Oral Oral Oral Oral  SpO2: 97% 93% 94% 100%  Weight:    73.9 kg  Height:        Intake/Output Summary (Last 24 hours) at 12/29/2019 0922 Last data filed at 12/29/2019 0807 Gross per 24 hour  Intake 800 ml  Output 1150 ml  Net -350 ml   Filed Weights   12/27/19 0322 12/28/19 0318 12/29/19 0456  Weight: 95.3 kg 94.3 kg 73.9 kg    Examination:  General exam: NAD.   Respiratory system: Decreased breath sounds in the bases.  No rhonchi.  No wheezing.  Normal respiratory effort.  Cardiovascular system: RRR no murmurs rubs or gallops.  No JVD.  No lower extremity edema. Gastrointestinal system: Abdomen is nontender, nondistended, soft, positive bowel sounds.  No rebound.  No guarding.  Central nervous system: Alert and oriented to self only.  Right upper extremity weakness.  Extremities: Bilateral upper extremity swelling decreased.  Right upper extremity weakness. Skin: Bilateral upper extremities with maculopapular rash as well as abdominal wall and back.  Rash also noted on neck area on the left.  Psychiatry: Judgement and insight appear poor  to fair. Mood & affect appropriate.     Data Reviewed: I have personally reviewed following labs and imaging studies  CBC: Recent Labs  Lab 12/24/19 0612 12/25/19 0644 12/26/19 0607 12/27/19 0427 12/29/19 0451  WBC 6.6 5.6 7.6 6.5 6.5  NEUTROABS 3.2 2.5 3.3 3.2 3.8  HGB 8.6* 8.1* 9.4* 10.1* 10.5*  HCT 29.1* 28.4* 32.4* 34.6* 35.3*  MCV 98.3 98.6 97.0 96.4 97.2  PLT 124* 128* 159 171 177    Basic Metabolic Panel: Recent Labs  Lab 12/24/19 0612 12/24/19 0612 12/25/19 0644 12/26/19 0607 12/27/19 0427 12/28/19 0516 12/29/19 0451  NA 140   < > 139 139 141 140 142  K 4.3   < > 3.7 3.6 3.3* 3.9 3.6  CL 98   < > 96* 94* 100 105 107  CO2 35*   < > 35* 36* 33* 26 27  GLUCOSE 112*   < > 148* 151* 142* 165* 182*  BUN 5*   < > 7* 6* 7* 11 10  CREATININE 0.38*   < > 0.41* 0.41* 0.47 0.51 0.47  CALCIUM 8.0*   < > 8.0* 8.3* 8.7* 8.5* 8.6*  MG  --   --  1.5* 2.0  --   --   --   PHOS 3.0  3.0  --   --   --   --   --   --    < > = values in this interval not displayed.    GFR: Estimated Creatinine Clearance: 62.6 mL/min (by C-G formula based on SCr of 0.47 mg/dL).  Liver Function Tests: Recent Labs  Lab 12/23/19 0316 12/24/19 0612  AST 13*  --   ALT 18  --   ALKPHOS 58  --   BILITOT 0.7  --   PROT 4.7*  --   ALBUMIN 1.7* 1.7*    CBG: Recent Labs  Lab 12/28/19 0622 12/28/19 1256 12/28/19 1605 12/28/19 2055 12/29/19 0618  GLUCAP 147* 195* 165* 274* 250*     Recent Results (from the past 240 hour(s))  Blood culture (routine x 2)  Status: None   Collection Time: 12/20/19  3:59 PM   Specimen: BLOOD  Result Value Ref Range Status   Specimen Description BLOOD RIGHT ANTECUBITAL  Final   Special Requests   Final    BOTTLES DRAWN AEROBIC AND ANAEROBIC Blood Culture adequate volume   Culture   Final    NO GROWTH 5 DAYS Performed at [facility], [address] [place], [place] [address]    Report Status 12/25/2019 FINAL  Final  Respiratory Panel by  RT PCR (Flu A&B, Covid) - Nasopharyngeal Swab     Status: None   Collection Time: 12/20/19  4:46 PM   Specimen: Nasopharyngeal Swab  Result Value Ref Range Status   SARS Coronavirus 2 by RT PCR NEGATIVE NEGATIVE Final    Comment: (NOTE) SARS-CoV-2 target nucleic acids are NOT DETECTED. The SARS-CoV-2 RNA is generally detectable in upper respiratoy specimens during the acute phase of infection. The lowest concentration of SARS-CoV-2 viral copies this assay can detect is 131 copies/mL. A negative result does not preclude SARS-Cov-2 infection and should not be used as the sole basis for treatment or other patient management decisions. A negative result may occur with  improper specimen collection/handling, submission of specimen other than nasopharyngeal swab, presence of viral mutation(s) within the areas targeted by this assay, and inadequate number of viral copies (<131 copies/mL). A negative result must be combined with clinical observations, patient history, and epidemiological information. The expected result is Negative. Fact Sheet for Patients:  https://www.moore.com/ Fact Sheet for Healthcare Providers:  https://www.young.biz/ This test is not yet ap proved or cleared by the [place] FDA and  has been authorized for detection and/or diagnosis of SARS-CoV-2 by FDA under an Emergency Use Authorization (EUA). This EUA will remain  in effect (meaning this test can be used) for the duration of the COVID-19 declaration under Section 564(b)(1) of the Act, 21 U.S.C. section 360bbb-3(b)(1), unless the authorization is terminated or revoked sooner.    Influenza A by PCR NEGATIVE NEGATIVE Final   Influenza B by PCR NEGATIVE NEGATIVE Final    Comment: (NOTE) The Xpert Xpress SARS-CoV-2/FLU/RSV assay is intended as an aid in  the diagnosis of influenza from Nasopharyngeal swab specimens and  should not be used as a sole basis for treatment.  Nasal washings and  aspirates are unacceptable for Xpert Xpress SARS-CoV-2/FLU/RSV  testing. Fact Sheet for Patients: https://www.moore.com/ Fact Sheet for Healthcare Providers: https://www.young.biz/ This test is not yet approved or cleared by the [place] FDA and  has been authorized for detection and/or diagnosis of SARS-CoV-2 by  FDA under an Emergency Use Authorization (EUA). This EUA will remain  in effect (meaning this test can be used) for the duration of the  Covid-19 declaration under Section 564(b)(1) of the Act, 21  U.S.C. section 360bbb-3(b)(1), unless the authorization is  terminated or revoked. Performed at [facility], [address], [place], [place] [address]   Urine Culture     Status: None   Collection Time: 12/20/19  4:53 PM   Specimen: Urine, Random  Result Value Ref Range Status   Specimen Description URINE, RANDOM  Final   Special Requests NONE  Final   Culture   Final    NO GROWTH Performed at [facility], [address], [place], [place] [address]    Report Status 12/21/2019 FINAL  Final  Blood culture (routine x 2)     Status: None   Collection Time: 12/20/19  5:21  PM   Specimen: BLOOD RIGHT HAND  Result Value Ref Range Status   Specimen Description BLOOD RIGHT HAND  Final   Special Requests   Final    BOTTLES DRAWN AEROBIC AND ANAEROBIC Blood Culture adequate volume   Culture   Final    NO GROWTH 5 DAYS Performed at [facility], [address], [place], [place] [address]    Report Status 12/25/2019 FINAL  Final         Radiology Studies: [place] EKG SITE RITE  Result Date: 12/27/2019 If Site Rite image not attached, placement could not be confirmed due to current cardiac rhythm.       Scheduled Meds: . apixaban  5 mg Oral BID  . atorvastatin  10 mg Oral Daily  . divalproex  500 mg Oral BID  . insulin aspart  0-20 Units Subcutaneous TID WC  . insulin glargine  5 Units  Subcutaneous [id number]  . mouth rinse  15 mL Mouth Rinse BID  . multivitamin with minerals  1 tablet Oral Daily  . Ensure Max Protein  11 oz Oral Daily  . senna-docusate  1 tablet Oral Daily   Continuous Infusions: . sodium chloride Stopped (12/25/19 1231)  . sodium chloride Stopped (12/26/19 1421)  . ampicillin-sulbactam (UNASYN) IV 3 g (12/29/19 0329)  . famotidine (PEPCID) IV 20 mg (12/28/19 2018)  . vancomycin 1,000 mg (12/29/19 0444)     LOS: 9 days    Time spent: 40 minutes    Ramiro Harvest, MD [facility]   To contact the attending provider between 7A-7P or the covering provider during after hours 7P-7A, please log into the web site [email or web address] and access using universal [place] password for that web site. If you do not have the password, please call the hospital operator.  12/29/2019, 9:22 AM

## 2019-12-29 NOTE — Consult Note (Signed)
[name] for Infectious Disease  Total days of antibiotics 20/vanco and amp/sub               Reason for Consult: drug rash   Referring Physician: Grandville Silos  Active Problems:   Pressure injury of skin   Respiratory failure with hypoxia (HCC)   Acute metabolic encephalopathy   Severe sepsis (HCC)   Osteomyelitis (HCC)   Abscess of multiple sites of buttock   AF (paroxysmal atrial fibrillation) (HCC)   Cardiac arrest (HCC)   Hyperphosphatemia   Edema of upper extremity    HPI: Becky Hendricks is a 74 y.o. female with complicated med history of afib, bipolar disorder, htn, dm2, obesity, had COVID-19 in January at [facility] now on 2L [place], but also developed sacral stage 4 ulcer and osteomyelitis admitted in late April where she was initiated on vancomycin and amp/sub and subseqeuntly discharged on 5/6 but readmitted in 72hr for cardiac arrest/CPR code- resuscitated and successfullly extubated on by 5/11. She was also being diuresed for CHF management. The patient started to develop macularpapular rash to back and underneath axilla and breast concerning for drug rash. ID asked to weigh in.  Past Medical History:  Diagnosis Date  . Atrial fibrillation ([place])   . Bipolar disorder ([place])   . Clostridium difficile diarrhea 08/02/2019  . Dehydration   . Essential hypertension   . Morbid obesity ([place])   . Type 2 diabetes mellitus ([place])   . Weakness     Allergies:  Allergies  Allergen Reactions  . Chlorhexidine Gluconate Itching     MEDICATIONS: . apixaban  5 mg Oral BID  . atorvastatin  10 mg Oral Daily  . divalproex  500 mg Oral BID  . furosemide  20 mg Intravenous Q12H  . insulin aspart  0-20 Units Subcutaneous TID WC  . insulin glargine  5 Units Subcutaneous BH-[id number]  . mouth rinse  15 mL Mouth Rinse BID  . multivitamin with minerals  1 tablet Oral Daily  . [START ON 12/30/2019] predniSONE  20 mg Oral QAC breakfast  . Ensure Max Protein  11 oz Oral Daily  . senna-docusate  1  tablet Oral Daily    Social History   Tobacco Use  . Smoking status: Never Smoker  . Smokeless tobacco: Never Used  Substance Use Topics  . Alcohol use: Not Currently  . Drug use: Not Currently    Family History  Family history unknown: Yes    Review of Systems -  Review of Systems  Constitutional: Negative for fever, chills, diaphoresis, activity change, appetite change, fatigue and unexpected weight change.  HENT: Negative for congestion, sore throat, rhinorrhea, sneezing, trouble swallowing and sinus pressure.  Eyes: Negative for photophobia and visual disturbance.  Respiratory: Negative for cough, chest tightness, shortness of breath, wheezing and stridor.  Cardiovascular: Negative for chest pain, palpitations and leg swelling.  Gastrointestinal: Negative for nausea, vomiting, abdominal pain, diarrhea, constipation, blood in stool, abdominal distention and anal bleeding.  Genitourinary: Negative for dysuria, hematuria, flank pain and difficulty urinating.  Musculoskeletal: Negative for myalgias, back pain, joint swelling, arthralgias and gait problem.  Skin: Negative for color change, pallor, rash and wound.  Neurological: Negative for dizziness, tremors, weakness and light-headedness.  Hematological: Negative for adenopathy. Does not bruise/bleed easily.  Psychiatric/Behavioral: Negative for behavioral problems, confusion, sleep disturbance, dysphoric mood, decreased concentration and agitation.      OBJECTIVE: Temp:  [97.2 F (36.2 C)-98.3 F (36.8 C)] 97.4 F (36.3 C) (05/18 1139) Pulse  Rate:  [65-92] 65 (05/18 1139) Resp:  [18-20] 20 (05/18 1139) BP: (133-183)/(67-99) 157/99 (05/18 1139) SpO2:  [93 %-100 %] 97 % (05/18 1139) Weight:  [73.9 kg] 73.9 kg (05/18 0456) Physical Exam  Constitutional:  oriented to person, only. appears chronically ill, deconditioned and well-nourished. No distress.  HENT: [place]/AT, PERRLA, no scleral icterus Mouth/Throat: Oropharynx is  clear and moist. No oropharyngeal exudate.  Cardiovascular: Normal rate, regular rhythm and normal heart sounds. Exam reveals no gallop and no friction rub.  No murmur heard.  Pulmonary/Chest: Effort normal and breath sounds normal. No respiratory distress.  has no wheezes.  Neck = supple, no nuchal rigidity Abdominal: Soft. Bowel sounds are normal.  exhibits no distension. There is no tenderness.  Back: blanching maculopapular rash coalesced together throughout torso Neurological: alert and oriented to person, only. Deconditioned, needs assistance with moving around in bed Skin: Skin is warm and dry. No rash noted. No erythema.  Psychiatric: a normal mood and affect.  behavior is normal.     LABS: Results for orders placed or performed during the hospital encounter of 12/20/19 (from the past 48 hour(s))  Glucose, capillary     Status: Abnormal   Collection Time: 12/27/19  4:12 PM  Result Value Ref Range   Glucose-Capillary 175 (H) 70 - 99 mg/dL    Comment: Glucose reference range applies only to samples taken after fasting for at least 8 hours.  Glucose, capillary     Status: Abnormal   Collection Time: 12/27/19  9:29 PM  Result Value Ref Range   Glucose-Capillary 209 (H) 70 - 99 mg/dL    Comment: Glucose reference range applies only to samples taken after fasting for at least 8 hours.  Basic metabolic panel     Status: Abnormal   Collection Time: 12/28/19  5:16 AM  Result Value Ref Range   Sodium 140 135 - 145 mmol/L   Potassium 3.9 3.5 - 5.1 mmol/L   Chloride 105 98 - 111 mmol/L   CO2 26 22 - 32 mmol/L   Glucose, Bld 165 (H) 70 - 99 mg/dL    Comment: Glucose reference range applies only to samples taken after fasting for at least 8 hours.   BUN 11 8 - 23 mg/dL   Creatinine, Ser 3.26 0.44 - 1.00 mg/dL   Calcium 8.5 (L) 8.9 - 10.3 mg/dL   GFR calc non Af Amer >60 >60 mL/min   GFR calc Af Amer >60 >60 mL/min   Anion gap 9 5 - 15    Comment: Performed at [facility]  [facility], [address], [place], [place] [address]  Glucose, capillary     Status: Abnormal   Collection Time: 12/28/19  6:22 AM  Result Value Ref Range   Glucose-Capillary 147 (H) 70 - 99 mg/dL    Comment: Glucose reference range applies only to samples taken after fasting for at least 8 hours.  Glucose, capillary     Status: Abnormal   Collection Time: 12/28/19 12:56 PM  Result Value Ref Range   Glucose-Capillary 195 (H) 70 - 99 mg/dL    Comment: Glucose reference range applies only to samples taken after fasting for at least 8 hours.  Glucose, capillary     Status: Abnormal   Collection Time: 12/28/19  4:05 PM  Result Value Ref Range   Glucose-Capillary 165 (H) 70 - 99 mg/dL    Comment: Glucose reference range applies only to samples taken after fasting for at least 8 hours.  Glucose,  capillary     Status: Abnormal   Collection Time: 12/28/19  8:55 PM  Result Value Ref Range   Glucose-Capillary 274 (H) 70 - 99 mg/dL    Comment: Glucose reference range applies only to samples taken after fasting for at least 8 hours.   Comment 1 Notify RN   CBC with Differential/Platelet     Status: Abnormal   Collection Time: 12/29/19  4:51 AM  Result Value Ref Range   WBC 6.5 4.0 - 10.5 K/uL   RBC 3.63 (L) 3.87 - 5.11 MIL/uL   Hemoglobin 10.5 (L) 12.0 - 15.0 g/dL   HCT 76.2 (L) 26.3 - 33.5 %   MCV 97.2 80.0 - 100.0 fL   MCH 28.9 26.0 - 34.0 pg   MCHC 29.7 (L) 30.0 - 36.0 g/dL   RDW 45.6 (H) 25.6 - 38.9 %   Platelets 177 150 - 400 K/uL   nRBC 0.0 0.0 - 0.2 %   Neutrophils Relative % 59 %   Neutro Abs 3.8 1.7 - 7.7 K/uL   Lymphocytes Relative 28 %   Lymphs Abs 1.9 0.7 - 4.0 K/uL   Monocytes Relative 8 %   Monocytes Absolute 0.6 0.1 - 1.0 K/uL   Eosinophils Relative 4 %   Eosinophils Absolute 0.3 0.0 - 0.5 K/uL   Basophils Relative 0 %   Basophils Absolute 0.0 0.0 - 0.1 K/uL   Immature Granulocytes 1 %   Abs Immature Granulocytes 0.04 0.00 - 0.07 K/uL    Comment: Performed at [facility], [address], [place], [place] [address]  Basic metabolic panel     Status: Abnormal   Collection Time: 12/29/19  4:51 AM  Result Value Ref Range   Sodium 142 135 - 145 mmol/L   Potassium 3.6 3.5 - 5.1 mmol/L   Chloride 107 98 - 111 mmol/L   CO2 27 22 - 32 mmol/L   Glucose, Bld 182 (H) 70 - 99 mg/dL    Comment: Glucose reference range applies only to samples taken after fasting for at least 8 hours.   BUN 10 8 - 23 mg/dL   Creatinine, Ser 8.76 0.44 - 1.00 mg/dL   Calcium 8.6 (L) 8.9 - 10.3 mg/dL   GFR calc non Af Amer >60 >60 mL/min   GFR calc Af Amer >60 >60 mL/min   Anion gap 8 5 - 15    Comment: Performed at [facility], [address] [place], [place] [address]  Brain natriuretic peptide     Status: Abnormal   Collection Time: 12/29/19  4:51 AM  Result Value Ref Range   B Natriuretic Peptide 183.9 (H) 0.0 - 100.0 pg/mL    Comment: Performed at [facility], [address], [place], [place] [address]  Glucose, capillary     Status: Abnormal   Collection Time: 12/29/19  6:18 AM  Result Value Ref Range   Glucose-Capillary 250 (H) 70 - 99 mg/dL    Comment: Glucose reference range applies only to samples taken after fasting for at least 8 hours.   Comment 1 Notify RN   Valproic acid level     Status: Abnormal   Collection Time: 12/29/19  8:35 AM  Result Value Ref Range   Valproic Acid Lvl 30 (L) 50.0 - 100.0 ug/mL    Comment: Performed at [facility], [address] [place], [place] [address]  Hepatic function panel     Status: Abnormal   Collection Time:  12/29/19  8:35 AM  Result Value Ref Range   Total Protein 6.0 (L) 6.5 - 8.1 g/dL   Albumin 2.1 (L) 3.5 - 5.0 g/dL   AST 22 15 - 41 U/L   ALT 19 0 - 44 U/L   Alkaline Phosphatase 76 38 - 126 U/L   Total Bilirubin 0.4 0.3 - 1.2 mg/dL   Bilirubin, Direct <9.6 0.0 - 0.2 mg/dL   Indirect Bilirubin NOT CALCULATED 0.3 - 0.9 mg/dL    Comment: Performed at [facility], [address],  [place], [place] [address]  Glucose, capillary     Status: Abnormal   Collection Time: 12/29/19 11:40 AM  Result Value Ref Range   Glucose-Capillary 116 (H) 70 - 99 mg/dL    Comment: Glucose reference range applies only to samples taken after fasting for at least 8 hours.    MICRO: reviewed IMAGING: DG CHEST PORT 1 VIEW  Result Date: 12/29/2019 CLINICAL DATA:  Shortness of breath EXAM: PORTABLE CHEST 1 VIEW COMPARISON:  12/21/2019 FINDINGS: Interval extubation. Enteric tube is no longer present. Central line is no longer present. Low lung volumes with persistent elevation of the right hemidiaphragm. Probable persistent right pleural effusion and right basilar atelectasis. Stable cardiomediastinal contours. IMPRESSION: Persistent elevation of the right hemidiaphragm with probable right pleural effusion and right basilar atelectasis. Electronically Signed   By: Guadlupe Spanish M.D.   On: 12/29/2019 09:59    Assessment/Plan:  74yo F deconditioned, afib, being treated for sacral osteo with vancomycin plus amp/sub found to have new onset rash. Also receiving diuretics, and recent change in depakote.  - suspect rash maybe due to acetazolamide that she had been receiving as part of diuresis - continue to monitor - if much worse over the next few days then can consider changing out abtx  Reneka Nebergall B. Drue Second MD MPH [facility] [phone]

## 2019-12-29 NOTE — Progress Notes (Signed)
Nutrition Follow-up  DOCUMENTATION CODES:   Obesity unspecified  INTERVENTION:   -Continue MVI with minerals daily -Continue Magic cup BID with meals, each supplement provides 290 kcal and 9 grams of protein -Continue Ensure Max po daily, each supplement provides 150 kcal and 30 grams of protein.   NUTRITION DIAGNOSIS:   Increased nutrient needs related to wound healing as evidenced by estimated needs.  Ongoing  GOAL:   Patient will meet greater than or equal to 90% of their needs  Progressing   MONITOR:   PO intake, Supplement acceptance, Labs, Weight trends, Skin, I & O's  REASON FOR ASSESSMENT:   Ventilator, Consult Enteral/tube feeding initiation and management  ASSESSMENT:   74 yo female admitted with septic shock, required brief CPR at SNF, intubated by EMS. PMH includes A fib, bipolar D/O, C diff colitis, HTN, obesity, DM-2, stage 4 pressure injury, L buttock abscess.  5/11- extubated 5/12- s/p BSE- advanced to regular diet with thin liquids  Reviewed I/O's: -450 ml x 24 hours and +1.8 L since admission  UOP: 1.2 L x 24 hours  Per IV team notes, pt with breakdown and rash on bilateral upper extremities. Pt is awaiting central line placement with IR for IV antibiotics.    Attempted to speak with pt via phone, however, no answer  Pt remains with good appetite. Noted meal completion 75-100%. She is consuming Ensure Max supplements approximately half of the time it is offered to her.   Plan to d/c back to [facility] once medically stable.   Labs reviewed: CBGS: 165-274 (inpatient orders for glycemic control are 0-20 units insulin aspart TID with meals and 5 units insulin glargine daily).   Diet Order:   Diet Order            Diet Carb Modified Fluid consistency: Thin; Room service appropriate? Yes  Diet effective now              EDUCATION NEEDS:   Not appropriate for education at this time  Skin:  Skin Assessment: Skin Integrity  Issues: Skin Integrity Issues:: Stage IV Stage IV: buttocks with tunneling  Last BM:  12/26/19  Height:   Ht Readings from Last 1 Encounters:  12/20/19 5\' 4"  (1.626 m)    Weight:   Wt Readings from Last 1 Encounters:  12/29/19 73.9 kg    Ideal Body Weight:  54.5 kg  BMI:  Body mass index is 27.98 kg/m.  Estimated Nutritional Needs:   Kcal:  1950-2150  Protein:  110-125 grams  Fluid:  > 1.9 L    12/31/19, RD, LDN, CDCES Registered Dietitian II Certified Diabetes Care and Education Specialist Please refer to [facility] for RD and/or RD on-call/weekend/after hours pager

## 2019-12-29 NOTE — Progress Notes (Signed)
Occupational Therapy Treatment Patient Details Name: Becky Hendricks MRN: [id number] DOB: 06/16/1946 Today's Date: 12/29/2019    History of present illness 74 Y/o with PMH of afib, bipolar, HTN, Obesity, DM, Pressure ulcer with recent admission for left buttock abscess s.p drainage brought to ED after arrest requiring brief CPR at nursing home.  pt was intubated in the field.  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  Pt extubated 5/11.   OT comments  Pt seen in conjunction with PT to progress functional mobility. Overall, pt required total A +2 for all aspects of bed mobility. Pt initially agreeable to complete higher level mobility tasks but retracts statement by stating, "that's enough for today" ( see cognition section).Pt currently would benefit from mechanical lift for OOB mobility. DC plan currently remains appropriate, will continue to follow acutely per POC.   Follow Up Recommendations  SNF;Supervision/Assistance - 24 hour    Equipment Recommendations  None recommended by OT    Recommendations for Other Services      Precautions / Restrictions Precautions Precautions: Fall Precaution Comments: sacral wound, stage IV with osteo Restrictions Weight Bearing Restrictions: No       Mobility Bed Mobility Overal bed mobility: Needs Assistance Bed Mobility: Supine to Sit;Sit to Supine     Supine to sit: Total assist;+2 for physical assistance Sit to supine: Total assist;+2 for physical assistance   General bed mobility comments: She agrees to mobility, but then does not follow through with assistance. She requires total assist for all bed mobility. Strong posterior lean with sitting.  Transfers                 General transfer comment: unable. Will need lift    Balance Overall balance assessment: Needs assistance Sitting-balance support: Feet supported Sitting balance-Leahy Scale:  Zero Sitting balance - Comments: unable to sit EOB without total A +2       Standing balance comment: unable                           ADL either performed or assessed with clinical judgement   ADL Overall ADL's : Needs assistance/impaired                     Lower Body Dressing: Total assistance;Bed level     Toilet Transfer Details (indicate cue type and reason): defer d/t generalized weakness         Functional mobility during ADLs: Total assistance;+2 for physical assistance(bed mobility only) General ADL Comments: pt continues to present with decreased activity tolerance and generalized weakness impacting pts ability to complete BADLs. pt requried max- total A +2 for bed mobility to sit EOB     Vision       Perception     Praxis      Cognition Arousal/Alertness: Awake/alert Behavior During Therapy: Flat affect Overall Cognitive Status: No family/caregiver present to determine baseline cognitive functioning Area of Impairment: Orientation;Attention;Memory;Following commands;Safety/judgement;Awareness;Problem solving                 Orientation Level: Disoriented to;Place;Time;Situation Current Attention Level: Sustained Memory: Decreased recall of precautions;Decreased short-term memory Following Commands: Follows one step commands inconsistently;Follows one step commands with increased time Safety/Judgement: Decreased awareness of safety;Decreased awareness of deficits Awareness: Intellectual Problem Solving: Slow processing;Decreased initiation;Difficulty sequencing;Requires verbal cues;Requires tactile cues General Comments: Patient agrees to move, but does not follow through.  She will follow commands inconsistently Pt disoriented to day and month thinking it was August. Pt very flat throughout session..         Exercises Other Exercises Other Exercises: noted RUE much weaker than LUE. pt with functional ROM in LUE but able to  complete < 50* sh flexion in RUE   Shoulder Instructions       General Comments      Pertinent Vitals/ Pain       Pain Assessment: No/denies pain  Home Living                                          Prior Functioning/Environment              Frequency  Min 2X/week        Progress Toward Goals  OT Goals(current goals can now be found in the care plan section)  Progress towards OT goals: Progressing toward goals  Acute Rehab OT Goals Patient Stated Goal: to not be sick anymore OT Goal Formulation: Patient unable to participate in goal setting Time For Goal Achievement: 01/06/20 Potential to Achieve Goals: [place] Discharge plan remains appropriate    Co-evaluation    PT/OT/SLP Co-Evaluation/Treatment: Yes Reason for Co-Treatment: Complexity of the patient's impairments (multi-system involvement);For patient/therapist safety;To address functional/ADL transfers PT goals addressed during session: Mobility/safety with mobility OT goals addressed during session: ADL's and self-care      AM-PAC OT "6 Clicks" Daily Activity     Outcome Measure   Help from another person eating meals?: A Lot Help from another person taking care of personal grooming?: A Lot Help from another person toileting, which includes using toliet, bedpan, or urinal?: Total Help from another person bathing (including washing, rinsing, drying)?: A Lot Help from another person to put on and taking off regular upper body clothing?: Total Help from another person to put on and taking off regular lower body clothing?: Total 6 Click Score: 9    End of Session    OT Visit Diagnosis: Other abnormalities of gait and mobility (R26.89);Muscle weakness (generalized) (M62.81);Other symptoms and signs involving cognitive function;Pain   Activity Tolerance Patient limited by fatigue;Other (comment)(limited by weakness)   Patient Left in bed;with call bell/phone within reach;with  bed alarm set   Nurse Communication Mobility status;Need for lift equipment;Other (comment)(need maxisky)        Time: 6387-5643 OT Time Calculation (min): 18 min  Charges: OT General Charges $OT Visit: 1 Visit  Lanier Clam., COTA/L Acute Rehabilitation Services [phone] [place] 12/29/2019, 2:17 PM

## 2019-12-30 DIAGNOSIS — I471 Supraventricular tachycardia: Secondary | ICD-10-CM

## 2019-12-30 LAB — CBC WITH DIFFERENTIAL/PLATELET
Abs Immature Granulocytes: 0.04 10*3/uL (ref 0.00–0.07)
Basophils Absolute: 0 10*3/uL (ref 0.0–0.1)
Basophils Relative: 0 %
Eosinophils Absolute: 0.6 10*3/uL — ABNORMAL HIGH (ref 0.0–0.5)
Eosinophils Relative: 7 %
HCT: 37 % (ref 36.0–46.0)
Hemoglobin: 11.2 g/dL — ABNORMAL LOW (ref 12.0–15.0)
Immature Granulocytes: 1 %
Lymphocytes Relative: 26 %
Lymphs Abs: 2.1 10*3/uL (ref 0.7–4.0)
MCH: 28.7 pg (ref 26.0–34.0)
MCHC: 30.3 g/dL (ref 30.0–36.0)
MCV: 94.9 fL (ref 80.0–100.0)
Monocytes Absolute: 0.7 10*3/uL (ref 0.1–1.0)
Monocytes Relative: 9 %
Neutro Abs: 4.6 10*3/uL (ref 1.7–7.7)
Neutrophils Relative %: 57 %
Platelets: 191 10*3/uL (ref 150–400)
RBC: 3.9 MIL/uL (ref 3.87–5.11)
RDW: 19.9 % — ABNORMAL HIGH (ref 11.5–15.5)
WBC: 8 10*3/uL (ref 4.0–10.5)
nRBC: 0 % (ref 0.0–0.2)

## 2019-12-30 LAB — BASIC METABOLIC PANEL
Anion gap: 15 (ref 5–15)
BUN: 12 mg/dL (ref 8–23)
CO2: 25 mmol/L (ref 22–32)
Calcium: 8.5 mg/dL — ABNORMAL LOW (ref 8.9–10.3)
Chloride: 101 mmol/L (ref 98–111)
Creatinine, Ser: 0.48 mg/dL (ref 0.44–1.00)
GFR calc Af Amer: >60 mL/min (ref >60)
GFR calc non Af Amer: >60 mL/min (ref >60)
Glucose, Bld: 177 mg/dL — ABNORMAL HIGH (ref 70–99)
Potassium: 4 mmol/L (ref 3.5–5.1)
Sodium: 141 mmol/L (ref 135–145)

## 2019-12-30 LAB — AMMONIA: Ammonia: 30 umol/L (ref 9–35)

## 2019-12-30 LAB — GLUCOSE, CAPILLARY
Glucose-Capillary: 202 mg/dL — ABNORMAL HIGH (ref 70–99)
Glucose-Capillary: 277 mg/dL — ABNORMAL HIGH (ref 70–99)
Glucose-Capillary: 175 mg/dL — ABNORMAL HIGH (ref 70–99)
Glucose-Capillary: 203 mg/dL — ABNORMAL HIGH (ref 70–99)

## 2019-12-30 LAB — MAGNESIUM: Magnesium: 1.8 mg/dL (ref 1.7–2.4)

## 2019-12-30 MED ORDER — HYDROCORTISONE 1 % EX CREA
TOPICAL_CREAM | Freq: Two times a day (BID) | CUTANEOUS | Status: DC
  Administered 2019-12-30: 1 via TOPICAL
  Filled 2019-12-30 (×3): qty 28

## 2019-12-30 MED ORDER — HYDROCORTISONE 0.5 % EX CREA
1.0000 "application " | TOPICAL_CREAM | Freq: Two times a day (BID) | CUTANEOUS | Status: DC
  Filled 2019-12-30 (×2): qty 28.35

## 2019-12-30 MED ORDER — FUROSEMIDE 10 MG/ML IJ SOLN
20.0000 mg | Freq: Two times a day (BID) | INTRAMUSCULAR | Status: DC
  Administered 2019-12-30 – 2020-01-01 (×5): 20 mg via INTRAVENOUS
  Filled 2019-12-30 (×5): qty 2

## 2019-12-30 MED ORDER — POTASSIUM CHLORIDE CRYS ER 20 MEQ PO TBCR
20.0000 meq | EXTENDED_RELEASE_TABLET | Freq: Once | ORAL | Status: AC
  Administered 2019-12-30: 20 meq via ORAL
  Filled 2019-12-30: qty 1

## 2019-12-30 MED ORDER — FAMOTIDINE IN NACL 20-0.9 MG/50ML-% IV SOLN
20.0000 mg | Freq: Two times a day (BID) | INTRAVENOUS | Status: AC
  Administered 2019-12-30 – 2020-01-01 (×4): 20 mg via INTRAVENOUS
  Filled 2019-12-30 (×4): qty 50

## 2019-12-30 MED ORDER — HYDROCORTISONE 0.5 % EX CREA
TOPICAL_CREAM | Freq: Two times a day (BID) | CUTANEOUS | Status: DC
  Filled 2019-12-30: qty 28.35

## 2019-12-30 MED ORDER — CARVEDILOL 6.25 MG PO TABS
6.2500 mg | ORAL_TABLET | Freq: Two times a day (BID) | ORAL | Status: DC
  Administered 2019-12-30 – 2019-12-31 (×3): 6.25 mg via ORAL
  Filled 2019-12-30 (×3): qty 1

## 2019-12-30 MED ORDER — DIPHENHYDRAMINE HCL 25 MG PO CAPS
25.0000 mg | ORAL_CAPSULE | Freq: Four times a day (QID) | ORAL | Status: DC | PRN
  Administered 2019-12-30: 25 mg via ORAL
  Filled 2019-12-30: qty 1

## 2019-12-30 NOTE — Progress Notes (Signed)
Pharmacy Antibiotic Note  Becky Hendricks is a 74 y.o. female admitted on 12/20/2019 with AMS and brief cardiac arrest.  Pharmacy has been consulted for vancomycin and ampicllin/sulbactam for osteomyelitis/bascess of the coccyx.  CT this admission showed decrease in size of left buttock fistula and abscess.   Patient afebrile, WBC wnl, Scr stable at 0.48. Next vancomycin trough scheduled for 5/24.  Plan: Vancomycin 1g IV Q12H Unasyn 3g IV Q6h Monitor clinical progress, c/s, renal function F/u vancomycin levels weekly and as indicated Abx end date 01/21/20   Height: 5\' 4"  (162.6 cm) Weight: 66.2 kg (146 lb) IBW/kg (Calculated) : 54.7  Temp (24hrs), Avg:98 F (36.7 C), Min:97.4 F (36.3 C), Max:98.4 F (36.9 C)  Recent Labs  Lab 12/25/19 0644 12/25/19 0644 12/26/19 0607 12/27/19 0427 12/28/19 0516 12/29/19 0451 12/30/19 0423  WBC 5.6  --  7.6 6.5  --  6.5 8.0  CREATININE 0.41*   < > 0.41* 0.47 0.51 0.47 0.48  VANCOTROUGH  --   --   --  21*  --   --   --    < > = values in this interval not displayed.    Estimated Creatinine Clearance: 59.5 mL/min (by C-G formula based on SCr of 0.48 mg/dL).    Antimicrobials: Vanc 4/22 >>(6/10) Unasyn 4/28 >>(6/10) Merrem 5/9 >> 5/11  Microbiology: 5/9 covid / flu - negative 5/9 UCx - neg 5/9  BCx - neg  Data from previous admit: 4/25 VT = 15 >> continue 5/2 VT 8 - adjust to 1g q12 5/6 corrected VT~16 - cont current dose 5/16 corrected VT 18.71 - cont current dose  4/22 BC: 1/3 viridan strep 4/22 UCx >>neg 4/23 L-buttock abscess: rare Staphylococcus warneri, Strep anginosis 4/23 MRSA PCR - positive   5/23, PharmD, BCPS Please check AMION for all [facility] contact numbers Clinical Pharmacist 12/30/2019 9:27 AM

## 2019-12-30 NOTE — Progress Notes (Signed)
[facility] for Infectious Disease    Date of Admission:  12/20/2019              ID: Becky Hendricks is a 74 y.o. female with sacral osteomyelitis Active Problems:   Pressure injury of skin   Respiratory failure with hypoxia (HCC)   Acute metabolic encephalopathy   Severe sepsis (HCC)   Osteomyelitis (HCC)   Abscess of multiple sites of buttock   AF (paroxysmal atrial fibrillation) (HCC)   Cardiac arrest (HCC)   Hyperphosphatemia   Edema of upper extremity    Subjective: Afebrile, but still c/o itchy rash. Appears to have spread to left arm. Slightly about flanks but spares chest, abdomen, and 3 other extremities. Still appears disoriented  Medications:  . apixaban  5 mg Oral BID  . atorvastatin  10 mg Oral Daily  . carvedilol  6.25 mg Oral BID WC  . divalproex  500 mg Oral BID  . furosemide  20 mg Intravenous Q12H  . hydrocortisone cream  1 application Topical BID  . insulin aspart  0-20 Units Subcutaneous TID WC  . insulin glargine  5 Units Subcutaneous BH-q7a  . mouth rinse  15 mL Mouth Rinse BID  . multivitamin with minerals  1 tablet Oral Daily  . predniSONE  20 mg Oral QAC breakfast  . Ensure Max Protein  11 oz Oral Daily  . senna-docusate  1 tablet Oral Daily    Objective: Vital signs in last 24 hours: Temp:  [97.6 F (36.4 C)-98.4 F (36.9 C)] 97.6 F (36.4 C) (05/19 1148) Pulse Rate:  [66-83] 66 (05/19 1148) Resp:  [16-20] 16 (05/19 1148) BP: (144-161)/(68-104) 155/82 (05/19 1148) SpO2:  [92 %-96 %] 93 % (05/19 1148) Weight:  [66.2 kg] 66.2 kg (05/19 0459)  Physical Exam  Constitutional:  oriented to person, place, and time. appears well-developed and well-nourished. No distress.  HENT: [place]/AT, PERRLA, no scleral icterus Mouth/Throat: Oropharynx is clear and moist. No oropharyngeal exudate.  Cardiovascular: Normal rate, regular rhythm and normal heart sounds. Exam reveals no gallop and no friction rub.  No murmur heard.  Pulmonary/Chest: Effort  normal and breath sounds normal. No respiratory distress.  has no wheezes.  Neck = supple, no nuchal rigidity Abdominal: Soft. Bowel sounds are normal.  exhibits no distension. There is no tenderness.  Lymphadenopathy: no cervical adenopathy. No axillary adenopathy Neurological: alert and oriented to person, place, and time.  Skin: patchy macular papular rash to right arm. Back. flanks Psychiatric: a normal mood and affect.  behavior is normal.   Lab Results Recent Labs    12/29/19 0451 12/30/19 0423  WBC 6.5 8.0  HGB 10.5* 11.2*  HCT 35.3* 37.0  NA 142 141  K 3.6 4.0  CL 107 101  CO2 27 25  BUN 10 12  CREATININE 0.47 0.48   Liver Panel Recent Labs    12/29/19 0835  PROT 6.0*  ALBUMIN 2.1*  AST 22  ALT 19  ALKPHOS 76  BILITOT 0.4  BILIDIR <0.1  IBILI NOT CALCULATED   Sedimentation Rate No results for input(s): ESRSEDRATE in the last 72 hours. C-Reactive Protein No results for input(s): CRP in the last 72 hours.  Microbiology: reviewed Studies/Results: DG CHEST PORT 1 VIEW  Result Date: 12/29/2019 CLINICAL DATA:  Shortness of breath EXAM: PORTABLE CHEST 1 VIEW COMPARISON:  12/21/2019 FINDINGS: Interval extubation. Enteric tube is no longer present. Central line is no longer present. Low lung volumes with persistent elevation of the right hemidiaphragm.  Probable persistent right pleural effusion and right basilar atelectasis. Stable cardiomediastinal contours. IMPRESSION: Persistent elevation of the right hemidiaphragm with probable right pleural effusion and right basilar atelectasis. Electronically Signed   By: Guadlupe Spanish M.D.   On: 12/29/2019 09:59     Assessment/Plan:  Sacral osteomyelitis = continue on amp/sub and vancomycin  Drug rash = thought to be due to acetazolamide, slightly worse today but only stopped med 2 days ago. Can use benadryl cream if pruritic. Would avoid any oral benadryl since do not want to worsen her sensorium  [facility] for Infectious [facility] Cell: [phone] Pager: [phone]  12/30/2019, 6:13 PM

## 2019-12-30 NOTE — Progress Notes (Signed)
Patient alarmed SVT on monitor, heart rate up to 170. Patient asymptomatic and heart rate back down to 90s. Scheduled coreg given. Will continue to monitor.

## 2019-12-30 NOTE — Progress Notes (Signed)
CCMD called to report tachycardia. Assessed patient to find her sleeping in no obvious distress. Patient has history of afib. Will continue to monitor.

## 2019-12-30 NOTE — Consult Note (Signed)
Cardiology Consultation:   Patient ID: Becky Hendricks MRN: [id number]; DOB: 1946/04/26  Admit date: 12/20/2019 Date of Consult: 12/30/2019  Primary Care Provider: [name], MD Primary Cardiologist: No primary care provider on file.  Theophilus Bones, [facility] Primary Electrophysiologist:  None    Patient Profile:   Becky Hendricks is a 74 y.o. female with a hx of hypertension hyperlipidemia morbid obesity who is being seen today for the evaluation of cardiac arrest at the request of Dr Tyrell Antonio.  History of Present Illness:   Becky Hendricks is a 74 year old female that had a prior history of atrial fibrillation on Eliquis, bipolar disorder, hypertension, diabetes who presented with an out of hospital cardiopulmonary arrest.  Her troponin was 161.  She was intubated.  She was agonal prior to.  She was at the nursing home where she was noted to look unwell, pale on 5/9 at lunch.  She had been in the nursing home secondary to recent discharge on 12/17/2019 for stage IV pressure ulcer on Unasyn and vancomycin.  She apparently received about 2 minutes of CPR during the episode of hypotension at the nursing home.  CT scan was negative for PE.  Upon arrival she was minimally responsive.  Started on Levophed for sepsis support, placed on IV antibiotics which were transitioned to meropenem and admitted to [facility]  She was weaned off of pressors, extubated and continued on IV antibiotics.  She was transferred to the hospitalist service back on 12/23/2019.  She had rash, possibly related to acetazolamide.  ID on board.  She developed tachycardia recently overnight.   Past Medical History:  Diagnosis Date  . Atrial fibrillation ([place])   . Bipolar disorder ([place])   . Clostridium difficile diarrhea 08/02/2019  . Dehydration   . Essential hypertension   . Morbid obesity ([place])   . Type 2 diabetes mellitus ([place])   . Weakness     Past Surgical History:  Procedure Laterality Date  .  INCISION AND DRAINAGE PERIRECTAL ABSCESS Left 12/04/2019   Procedure: IRRIGATION AND DEBRIDEMENT LEFT BUTTOCK ABSCESS;  Surgeon: Georganna Skeans, MD;  Location: [facility];  Service: General;  Laterality: Left;  . INCISION AND DRAINAGE PERIRECTAL ABSCESS Left 12/10/2019   Procedure: REPEAT IRRIGATION AND DEBRIDEMENT BUTTOCK  ABSCESS;  Surgeon: Coralie Keens, MD;  Location: [facility];  Service: General;  Laterality: Left;     Home Medications:  Prior to Admission medications   Medication Sig Start Date End Date Taking? Authorizing Provider  acetaminophen (TYLENOL) 325 MG tablet Take 650 mg by mouth every 6 (six) hours as needed for mild pain.    Yes [provider]  ampicillin-sulbactam (UNASYN) IVPB Inject 3 g into the vein every 6 (six) hours. Indication:  Coccygeal osteo/sacral decubitus ulcer  First Dose: Yes Last Day of Therapy:  01/21/20 Labs - Once weekly:  CBC/D and BMP, Labs - Every other week:  ESR and CRP Method of administration: Mini-Bag Plus / Gravity Method of administration may be changed at the discretion of home infusion pharmacist based upon assessment of the patient and/or caregiver's ability to self-administer the medication ordered. 12/17/19 01/27/20 Yes Dessa Phi, DO  apixaban (ELIQUIS) 5 MG TABS tablet Take 5 mg by mouth 2 (two) times daily.  08/11/19 08/05/20 Yes [provider]  ascorbic acid (VITAMIN C) 1000 MG tablet Take 500 mg by mouth 2 (two) times daily. 09/08/12  Yes [provider]  atorvastatin (LIPITOR) 10 MG tablet Take 10 mg by mouth at bedtime.  11/21/19  Yes [provider]  calcium carbonate (TUMS - DOSED IN MG ELEMENTAL CALCIUM) 500 MG chewable tablet Chew 1 tablet by mouth daily.   Yes [provider]  carvedilol (COREG) 25 MG tablet Take 1 tablet (25 mg total) by mouth 2 (two) times daily with a meal. 12/17/19  Yes Dessa Phi, DO  cholecalciferol (VITAMIN D3) 25 MCG (1000 UNIT) tablet Take 1,000 Units by mouth  daily.   Yes [provider]  divalproex (DEPAKOTE SPRINKLE) 125 MG capsule Take 125 mg by mouth at bedtime. 10/30/19  Yes [provider]  divalproex (DEPAKOTE) 500 MG DR tablet Take 500 mg by mouth daily. 06/13/12  Yes [provider]  insulin lispro (HUMALOG) 100 UNIT/ML cartridge Inject into the skin See admin instructions. Inject into the skin 2 times a day between meals, per sliding scale: BGL 0-200 = give nothing; 201-250 = 2 units; 251-300 = 4 units; 301-350 = 6 units; 351-400 = 8 units; 401-450 = 10 units; 451-500 = 12 units; >450 = 12 units and CALL PROVIDER IF STILL ELEVATED AFTER 2 HOURS   Yes [provider]  Ipratropium-Albuterol (COMBIVENT) 20-100 MCG/ACT AERS respimat Inhale 1 puff into the lungs every 6 (six) hours. Patient taking differently: Inhale 1 puff into the lungs every 6 (six) hours as needed ("for COPD").  09/21/19  Yes Amin, Ankit Chirag, MD  JANUMET 50-1000 MG tablet Take 1 tablet by mouth 2 (two) times daily. 10/30/19  Yes [provider]  LANTUS 100 UNIT/ML injection Inject 13 Units into the skin daily. 11/23/19  Yes [provider]  lisinopril (ZESTRIL) 20 MG tablet Take 20 mg by mouth daily. 08/11/19 08/05/20 Yes [provider]  Omega-3 Fatty Acids (OMEGA-3 FISH OIL PO) Take 1 capsule by mouth daily.   Yes [provider]  sennosides-docusate sodium (SENOKOT-S) 8.6-50 MG tablet Take 1 tablet by mouth daily.   Yes [provider]  vancomycin IVPB Inject 1,000 mg into the vein every 12 (twelve) hours. Indication:  Coccygeal osteo/sacral decubitus ulcer  First Dose: Yes Last Day of Therapy:  01/21/2020 Labs - Sunday/Monday:  CBC/D, BMP, and vancomycin trough. Labs - Thursday:  BMP and vancomycin trough Labs - Every other week:  ESR and CRP Method of administration:Elastomeric Method of administration may be changed at the discretion of the patient and/or caregiver's ability to self-administer  the medication ordered. 12/17/19 01/25/20 Yes Dessa Phi, DO    Inpatient Medications: Scheduled Meds: . apixaban  5 mg Oral BID  . atorvastatin  10 mg Oral Daily  . divalproex  500 mg Oral BID  . furosemide  20 mg Intravenous Q12H  . insulin aspart  0-20 Units Subcutaneous TID WC  . insulin glargine  5 Units Subcutaneous BH-q7a  . mouth rinse  15 mL Mouth Rinse BID  . multivitamin with minerals  1 tablet Oral Daily  . predniSONE  20 mg Oral QAC breakfast  . Ensure Max Protein  11 oz Oral Daily  . senna-docusate  1 tablet Oral Daily   Continuous Infusions: . sodium chloride Stopped (12/25/19 1231)  . sodium chloride 250 mL (12/29/19 2149)  . ampicillin-sulbactam (UNASYN) IV 3 g (12/30/19 1647)  . vancomycin Stopped (12/30/19 0526)   PRN Meds: sodium chloride, sodium chloride, docusate sodium, fentaNYL (SUBLIMAZE) injection, ipratropium-albuterol, polyethylene glycol  Allergies:    Allergies  Allergen Reactions  . Chlorhexidine Gluconate Itching    Social History:   Social History   Socioeconomic History  . Marital status: Single  Spouse name: Not on file  . Number of children: Not on file  . Years of education: Not on file  . Highest education level: Not on file  Occupational History  . Not on file  Tobacco Use  . Smoking status: Never Smoker  . Smokeless tobacco: Never Used  Substance and Sexual Activity  . Alcohol use: Not Currently  . Drug use: Not Currently  . Sexual activity: Not Currently    Birth control/protection: Post-menopausal  Other Topics Concern  . Not on file  Social History Narrative  . Not on file   Social Determinants of Health   Financial Resource Strain:   . Difficulty of Paying Living Expenses:   Food Insecurity:   . Worried About Charity fundraiser in the Last Year:   . Arboriculturist in the Last Year:   Transportation Needs:   . Film/video editor (Medical):   Marland Kitchen Lack of Transportation (Non-Medical):   Physical  Activity:   . Days of Exercise per Week:   . Minutes of Exercise per Session:   Stress:   . Feeling of Stress :   Social Connections:   . Frequency of Communication with Friends and Family:   . Frequency of Social Gatherings with Friends and Family:   . Attends Religious Services:   . Active Member of Clubs or Organizations:   . Attends Archivist Meetings:   Marland Kitchen Marital Status:   Intimate Partner Violence:   . Fear of Current or Ex-Partner:   . Emotionally Abused:   Marland Kitchen Physically Abused:   . Sexually Abused:     Family History:    Family History  Family history unknown: Yes     ROS:  Please see the history of present illness.  Recent fevers, lethargy, sepsis.  No chest pain All other ROS reviewed and negative.     Physical Exam/Data:   Vitals:   12/30/19 0459 12/30/19 0508 12/30/19 0810 12/30/19 1148  BP:  (!) 161/104 (!) 144/68 (!) 155/82  Pulse:  83 74 66  Resp:  _0 Temp:  97.9 F (36.6 C) 98.4 F (36.9 C) 97.6 F (36.4 C)  TempSrc:  Oral Oral Oral  SpO2:  96% 92% 93%  Weight: 66.2 kg     Height:        Intake/Output Summary (Last 24 hours) at 12/30/2019 1702 Last data filed at 12/30/2019 1333 Gross per 24 hour  Intake 1497.35 ml  Output 2250 ml  Net -752.65 ml   Last 3 Weights 12/30/2019 12/29/2019 12/28/2019  Weight (lbs) 146 lb 163 lb 208 lb  Weight (kg) 66.225 kg 73.936 kg 94.348 kg     Body mass index is 25.06 kg/m.  General:  Well nourished, well developed, in no acute distress HEENT: normal Lymph: no adenopathy Neck: no JVD Endocrine:  No thryomegaly Vascular: No carotid bruits; FA pulses 2+ bilaterally without bruits  Cardiac:  normal S1, S2; RRR; no murmur  Lungs:  clear to auscultation bilaterally, no wheezing, rhonchi or rales  Abd: soft, nontender, no hepatomegaly  Ext: no edema Musculoskeletal:  No deformities, BUE and BLE strength normal and equal Skin: warm and dry  Neuro:  CNs 2-12 intact, no focal abnormalities noted  Psych:  Normal affect   EKG:  The EKG was personally reviewed and demonstrates: See below, multifocal atrial tachycardia Telemetry:  Telemetry was personally reviewed and demonstrates: Multifocal atrial tachycardia, PACs  Relevant CV Studies: Echo with normal EF  Laboratory Data:  High Sensitivity Troponin:   Recent Labs  Lab 12/20/19 1543 12/20/19 1920  TROPONINIHS 76* 161*     Chemistry Recent Labs  Lab 12/28/19 0516 12/29/19 0451 12/30/19 0423  NA 140 142 141  K 3.9 3.6 4.0  CL 105 107 101  CO2 _0 GLUCOSE 165* 182* 177*  BUN _1 CREATININE 0.51 0.47 0.48  CALCIUM 8.5* 8.6* 8.5*  GFRNONAA >60 >60 >60  GFRAA >60 >60 >60  ANIONGAP _2 Recent Labs  Lab 12/24/19 0612 12/29/19 0835  PROT  --  6.0*  ALBUMIN 1.7* 2.1*  AST  --  22  ALT  --  19  ALKPHOS  --  76  BILITOT  --  0.4   Hematology Recent Labs  Lab 12/27/19 0427 12/29/19 0451 12/30/19 0423  WBC 6.5 6.5 8.0  RBC 3.59* 3.63* 3.90  HGB 10.1* 10.5* 11.2*  HCT 34.6* 35.3* 37.0  MCV 96.4 97.2 94.9  MCH 28.1 28.9 28.7  MCHC 29.2* 29.7* 30.3  RDW 19.9* 19.7* 19.9*  PLT 171 177 191   BNP Recent Labs  Lab 12/29/19 0451  BNP 183.9*    DDimer No results for input(s): DDIMER in the last 168 hours.   Radiology/Studies:  DG CHEST PORT 1 VIEW  Result Date: 12/29/2019 CLINICAL DATA:  Shortness of breath EXAM: PORTABLE CHEST 1 VIEW COMPARISON:  12/21/2019 FINDINGS: Interval extubation. Enteric tube is no longer present. Central line is no longer present. Low lung volumes with persistent elevation of the right hemidiaphragm. Probable persistent right pleural effusion and right basilar atelectasis. Stable cardiomediastinal contours. IMPRESSION: Persistent elevation of the right hemidiaphragm with probable right pleural effusion and right basilar atelectasis. Electronically Signed   By: Macy Mis M.D.   On: 12/29/2019 09:59   [place] EKG SITE RITE  Result Date: 12/27/2019 If Site Rite  image not attached, placement could not be confirmed due to current cardiac rhythm.        Assessment and Plan:   Cardiopulmonary arrest -This was likely severe hypotension in the setting of severe sepsis which responded promptly to resuscitative efforts such as IV pressors and was secondary to pressure ulcer and osteomyelitis. -Ejection fraction was normal with EF of 65%. -Troponin was only mildly elevated in the 161 range.  Clearly there was no evidence of arterial occlusion or ACS at that time.  The troponin would have been much higher. -Continue with post sepsis care.  When she is over the complete infection, it would not be unreasonable to perform further ischemic testing such as nuclear stress test for further restratification.  This is not of urgent need.  Paroxysmal atrial fibrillation/multifocal atrial tachycardia -Episode on EKG from earlier this morning clearly shows different morphologies of P wave preceding each QRS complex.  This is multifocal atrial tachycardia.  Thankfully this is not atrial fibrillation at this time.  She has been diagnosed with paroxysmal atrial fibrillation in the past.  Multifocal atrial tachycardia is usually seen with underlying lung disease.  Treatment for this entity is usually treating the underlying lung disease. -Continue with Eliquis for stroke prevention. -We will restart carvedilol at 6.25 twice daily, titrate up throughout stay for blood pressure and especially if tachycardia continues. -Heart rate fluctuated between 80s and 130s per nurse this morning.  Medic.  Essential hypertension -Treat hypertension per primary team.  Please let us know if we can be of further assistance.  We are signing off.  She may continue to follow-up with her cardiologist as outpatient, she sees cardiology at [facility].    For questions or updates, please contact [place] Please consult [email or web address] for contact info under     Signed, Candee Furbish, MD   12/30/2019 5:02 PM

## 2019-12-30 NOTE — Progress Notes (Signed)
PROGRESS NOTE    Becky Hendricks  ZOX:[id number] DOB: 07/26/1946 DOA: 12/20/2019 PCP: Roderic ScarceHartman, Lisa, MD   Brief Narrative: 74 year old with past medical history significant for A. fib on Eliquis, bipolar disorder, hypertension,  diabetes type 2 who presented after out of hospital cardiopulmonary arrest.  Patient was recently discharged on 12/17/2019 for stage IV pressure ulcer to be on Unasyn plus vancomycin until 6/10.  On 5/9, staff at the nursing home reported she didnt " look right" at lunch.  She was later found in her room with agonal  breathing without a pulse.  In the ED, SPO2 100% intubated, blood pressure 120/91 on levophed. , blood glucose 300, lactate of 6.  White blood cell 11.  ABG seven-point 4/60/27/42.  Troponin 76 and 161 6 hours later.  EKG normal sinus rhythm.  Patient was minimally responsive no sedation.  She was a started on Levophed for blood pressure support and transition to meropenem [facility]  Subsequently weaned off of pressors.  Patient extubated.  Patient continued to be on IV antibiotics.  Patient transferred to the hospitalist service on 12/23/2019. Patient developed a rash, ID think it could be related to acetazolamide.  ID recommend to continue with current antibiotics. Patient developed tachycardia overnight, PA-C.  Cardiology has been consulted due to recent cardiac arrest.    Assessment & Plan:   Active Problems:   Pressure injury of skin   Respiratory failure with hypoxia (HCC)   Acute metabolic encephalopathy   Severe sepsis (HCC)   Osteomyelitis (HCC)   Abscess of multiple sites of buttock   AF (paroxysmal atrial fibrillation) (HCC)   Cardiac arrest (HCC)   Hyperphosphatemia   Edema of upper extremity  1-Cardiopulmonary arrest: Patient noted to have cardiac/pulmonary arrest on 12/20/2019 the nursing home require 1 or 2 minutes of CPR before ROSC.  -This was thought to be related to septic shock secondary to a stage IV ulcer with  osteomyelitis -Patient required IV pressors initially, she has been weaned off -2D echo ejection fraction 60 to 65% -Mild elevated troponin -CT angiogram negative for PE -Developed tachycardia and PACs overnight.  Cardiology has been consulted  2-Severe sepsis with septic shock secondary to osteomyelitis of the sacrum coccygeal stage IV pressure ulcer, POA -Secondary to osteomyelitis of the sacrum and cocci. -Patient noted to have polymicrobial decubitus ulcer with a strep viridans bacteremia noted last admission. 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 -Continue with IV antibiotics; Unasyn and vancomycin  3-Acute Metabolic encephalopathy: Secondary to patient acute pulmonary arrest. 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 Continue with IV antibiotics  5-Paroxysmal A. fib: Continue with Eliquis 6-Bipolar:  Continue with Depakote 7-DM type II: continue with Lantus and sliding scale 8-Hyperphosphatemia: Continue with calcium binders 9-Maculopapular rash: ID reconsulted, they think it could be related to acetazolamide Continue with current antibiotics. Received IV Benadryl, Pepcid and prednisone Patient report improvement of rash.  Continue to follow. 10-Bilateral  pleural Effusion, LE edema;  Continue with IV Lasix Chest x-ray 5/18: Show persistent right  pleural effusion.   Pressure Injury 12/20/19 Buttocks Right Stage 4 - Full thickness tissue loss with exposed bone, tendon or muscle. (Active)  12/20/19 2030  Location: Buttocks  Location Orientation: Right  Staging: Stage 4 - Full thickness tissue loss with exposed bone, tendon or muscle.  Wound Description (Comments):   Present on Admission: Yes     Pressure Injury 12/20/19 Buttocks Left;Proximal Stage 4 - Full thickness tissue loss with exposed bone, tendon or muscle. (Active)  12/20/19 2030  Location: Buttocks  Location Orientation: Left;Proximal  Staging: Stage 4 - Full thickness tissue loss with exposed bone, tendon or muscle.  Wound Description (Comments):   Present on Admission: Yes     Pressure Injury 12/20/19 Buttocks Left;Distal Stage 4 - Full thickness tissue loss with exposed bone, tendon or muscle. (Active)  12/20/19 2030  Location: Buttocks  Location Orientation: Left;Distal  Staging: Stage 4 - Full thickness tissue loss with exposed bone, tendon or muscle.  Wound Description (Comments):   Present on Admission: Yes     Nutrition Problem: Increased nutrient needs Etiology: wound healing    Signs/Symptoms: estimated needs    Interventions: (P) MVI, Premier Protein, Magic cup  Estimated body mass index is 25.06 kg/m as calculated from the following:   Height as of this encounter: 5\' 4"  (1.626 m).   Weight as of this encounter: 66.2 kg.   DVT prophylaxis: Eliquis Code Status: Full Code Family Communication: No family at bedside.  Disposition Plan:  Status is: Inpatient  Remains inpatient appropriate because; patient is still requiring IV Lasix this for pleural effusion and volume overload.  Continue to monitor rash.  Cardiology evaluation   Dispo: The patient is from: Skilled nursing facility              Anticipated d/c is to: Nursing facility              Anticipated d/c date is: 2 or 3 days              Patient currently ; not medically stable          Consultants:   ID  Cardiology   Procedures:   ECHO;   Antimicrobials:  Unasyn Vancomycin  Subjective: He is alert, confused, answer questions, report mild shortness of breath.  Feels the rash is better  Objective: Vitals:   12/29/19 2033 12/30/19 0459 12/30/19 0508 12/30/19 0810  BP: (!) 144/72  (!) 161/104 (!) 144/68  Pulse: 83  83 74  Resp: 20  18 20   Temp: 98.2 F (36.8 C)  97.9 F (36.6 C) 98.4 F (36.9 C)  TempSrc: Oral  Oral Oral  SpO2: 96%  96% 92%  Weight:  66.2 kg    Height:        Intake/Output Summary (Last 24 hours) at 12/30/2019 1001 Last data filed at 12/30/2019 0755 Gross per 24 hour  Intake 847 ml  Output 2550 ml  Net -1703 ml   Filed Weights   12/28/19 0318 12/29/19 0456 12/30/19 0459  Weight: 94.3 kg 73.9 kg 66.2 kg    Examination:  General exam: Appears calm and comfortable  Respiratory system: Clear to auscultation. Respiratory effort normal. Cardiovascular system: S1 & S2 heard, RRR. No JVD, murmurs, rubs, gallops or clicks. No pedal edema. Gastrointestinal system: Abdomen is nondistended, soft and nontender. No organomegaly or masses felt. Normal bowel sounds heard. Central nervous system: Alert and  oriented. No focal neurological deficits. Extremities: Symmetric 5 x 5 power. Skin: Papular papular rash more pronounced on the torso and flank area   Data Reviewed: I have personally reviewed following labs and imaging studies  CBC: Recent Labs  Lab 12/25/19 0644 12/26/19 0607 12/27/19 0427 12/29/19 0451 12/30/19 0423  WBC 5.6 7.6 6.5 6.5 8.0  NEUTROABS 2.5 3.3 3.2 3.8 4.6  HGB 8.1* 9.4* 10.1* 10.5* 11.2*  HCT 28.4* 32.4* 34.6* 35.3* 37.0  MCV 98.6 97.0 96.4 97.2 94.9  PLT 128* 159 171 177 191   Basic Metabolic Panel: Recent Labs  Lab 12/24/19 0612 12/24/19 0612 12/25/19 0644 12/25/19 0644 12/26/19 0607 12/27/19 0427 12/28/19 0516 12/29/19 0451 12/30/19 0423  NA 140   < > 139   < > 139 141 140 142 141   K 4.3   < > 3.7   < > 3.6 3.3* 3.9 3.6 4.0  CL 98   < > 96*   < > 94* 100 105 107 101  CO2 35*   < > 35*   < > 36* 33* 26 27 25   GLUCOSE 112*   < > 148*   < > 151* 142* 165* 182* 177*  BUN 5*   < > 7*   < > 6* 7* 11 10 12   CREATININE 0.38*   < > 0.41*   < > 0.41* 0.47 0.51 0.47 0.48  CALCIUM 8.0*   < > 8.0*   < > 8.3* 8.7* 8.5* 8.6* 8.5*  MG  --   --  1.5*  --  2.0  --   --   --  1.8  PHOS 3.0  3.0  --   --   --   --   --   --   --   --    < > = values in this interval not displayed.   GFR: Estimated Creatinine Clearance: 59.5 mL/min (by C-G formula based on SCr of 0.48 mg/dL). Liver Function Tests: Recent Labs  Lab 12/24/19 0612 12/29/19 0835  AST  --  22  ALT  --  19  ALKPHOS  --  76  BILITOT  --  0.4  PROT  --  6.0*  ALBUMIN 1.7* 2.1*   No results for input(s): LIPASE, AMYLASE in the last 168 hours. Recent Labs  Lab 12/23/19 1027  AMMONIA 12   Coagulation Profile: No results for input(s): INR, PROTIME in the last 168 hours. Cardiac Enzymes: No results for input(s): CKTOTAL, CKMB, CKMBINDEX, TROPONINI in the last 168 hours. BNP (last 3 results) No results for input(s): PROBNP in the last 8760 hours. HbA1C: No results for input(s): HGBA1C in the last 72 hours. CBG: Recent Labs  Lab 12/29/19 0618 12/29/19 1140 12/29/19 1639 12/29/19 2030 12/30/19 0610  GLUCAP 250* 116* 166* 258* 202*   Lipid Profile: No results for input(s): CHOL, HDL, LDLCALC, TRIG, CHOLHDL, LDLDIRECT in the last 72 hours. Thyroid Function Tests: No results for input(s): TSH, T4TOTAL, FREET4, T3FREE, THYROIDAB in the last 72 hours. Anemia Panel: No results for input(s): VITAMINB12, FOLATE, FERRITIN, TIBC, IRON, RETICCTPCT in the last 72 hours. Sepsis Labs: No results for input(s): PROCALCITON, LATICACIDVEN in the last 168 hours.  Recent Results (from the past 240 hour(s))  Blood culture (routine x 2)     Status: None   Collection Time: 12/20/19  3:59 PM   Specimen: BLOOD  Result Value  Ref Range Status   Specimen Description BLOOD RIGHT ANTECUBITAL  Final   Special Requests  Final    BOTTLES DRAWN AEROBIC AND ANAEROBIC Blood Culture adequate volume   Culture   Final    NO GROWTH 5 DAYS Performed at [facility], [address] [place], [place] [address]    Report Status 12/25/2019 FINAL  Final  Respiratory Panel by RT PCR (Flu A&B, Covid) - Nasopharyngeal Swab     Status: None   Collection Time: 12/20/19  4:46 PM   Specimen: Nasopharyngeal Swab  Result Value Ref Range Status   SARS Coronavirus 2 by RT PCR NEGATIVE NEGATIVE Final    Comment: (NOTE) SARS-CoV-2 target nucleic acids are NOT DETECTED. The SARS-CoV-2 RNA is generally detectable in upper respiratoy specimens during the acute phase of infection. The lowest concentration of SARS-CoV-2 viral copies this assay can detect is 131 copies/mL. A negative result does not preclude SARS-Cov-2 infection and should not be used as the sole basis for treatment or other patient management decisions. A negative result may occur with  improper specimen collection/handling, submission of specimen other than nasopharyngeal swab, presence of viral mutation(s) within the areas targeted by this assay, and inadequate number of viral copies (<131 copies/mL). A negative result must be combined with clinical observations, patient history, and epidemiological information. The expected result is Negative. Fact Sheet for Patients:  https://www.moore.com/ Fact Sheet for Healthcare Providers:  https://www.young.biz/ This test is not yet ap proved or cleared by the [place] FDA and  has been authorized for detection and/or diagnosis of SARS-CoV-2 by FDA under an Emergency Use Authorization (EUA). This EUA will remain  in effect (meaning this test can be used) for the duration of the COVID-19 declaration under Section 564(b)(1) of the Act, 21 U.S.C. section 360bbb-3(b)(1), unless the  authorization is terminated or revoked sooner.    Influenza A by PCR NEGATIVE NEGATIVE Final   Influenza B by PCR NEGATIVE NEGATIVE Final    Comment: (NOTE) The Xpert Xpress SARS-CoV-2/FLU/RSV assay is intended as an aid in  the diagnosis of influenza from Nasopharyngeal swab specimens and  should not be used as a sole basis for treatment. Nasal washings and  aspirates are unacceptable for Xpert Xpress SARS-CoV-2/FLU/RSV  testing. Fact Sheet for Patients: https://www.moore.com/ Fact Sheet for Healthcare Providers: https://www.young.biz/ This test is not yet approved or cleared by the [place] FDA and  has been authorized for detection and/or diagnosis of SARS-CoV-2 by  FDA under an Emergency Use Authorization (EUA). This EUA will remain  in effect (meaning this test can be used) for the duration of the  Covid-19 declaration under Section 564(b)(1) of the Act, 21  U.S.C. section 360bbb-3(b)(1), unless the authorization is  terminated or revoked. Performed at [facility], [address] [place], [place] [address]   Urine Culture     Status: None   Collection Time: 12/20/19  4:53 PM   Specimen: Urine, Random  Result Value Ref Range Status   Specimen Description URINE, RANDOM  Final   Special Requests NONE  Final   Culture   Final    NO GROWTH Performed at [facility], [address], [place], [place] [address]    Report Status 12/21/2019 FINAL  Final  Blood culture (routine x 2)     Status: None   Collection Time: 12/20/19  5:21 PM   Specimen: BLOOD RIGHT HAND  Result Value Ref Range Status   Specimen Description BLOOD RIGHT HAND  Final   Special Requests   Final    BOTTLES DRAWN AEROBIC AND  ANAEROBIC Blood Culture adequate volume   Culture   Final    NO GROWTH 5 DAYS Performed at [facility], [address] [place], [place] [address]    Report Status 12/25/2019 FINAL  Final         Radiology Studies:  DG CHEST PORT 1 VIEW  Result Date: 12/29/2019 CLINICAL DATA:  Shortness of breath EXAM: PORTABLE CHEST 1 VIEW COMPARISON:  12/21/2019 FINDINGS: Interval extubation. Enteric tube is no longer present. Central line is no longer present. Low lung volumes with persistent elevation of the right hemidiaphragm. Probable persistent right pleural effusion and right basilar atelectasis. Stable cardiomediastinal contours. IMPRESSION: Persistent elevation of the right hemidiaphragm with probable right pleural effusion and right basilar atelectasis. Electronically Signed   By: Guadlupe Spanish M.D.   On: 12/29/2019 09:59        Scheduled Meds: . apixaban  5 mg Oral BID  . atorvastatin  10 mg Oral Daily  . divalproex  500 mg Oral BID  . furosemide  20 mg Intravenous Q12H  . insulin aspart  0-20 Units Subcutaneous TID WC  . insulin glargine  5 Units Subcutaneous [id number]  . mouth rinse  15 mL Mouth Rinse BID  . multivitamin with minerals  1 tablet Oral Daily  . potassium chloride  20 mEq Oral Once  . predniSONE  20 mg Oral QAC breakfast  . Ensure Max Protein  11 oz Oral Daily  . senna-docusate  1 tablet Oral Daily   Continuous Infusions: . sodium chloride Stopped (12/25/19 1231)  . sodium chloride 250 mL (12/29/19 2149)  . ampicillin-sulbactam (UNASYN) IV 3 g (12/30/19 4163)  . vancomycin 1,000 mg (12/30/19 0520)     LOS: 10 days    Time spent: 35 minutes    Belkys A Regalado, MD [facility]   If 7PM-7AM, please contact night-coverage [email or web address]  12/30/2019, 10:01 AM

## 2019-12-30 NOTE — Progress Notes (Signed)
Patient mews score turned yellow when cardiac monitor tech documented heart rate. Heart rate fluctuating between 80s-130s. Patient asymptomatic. EKG obtained per order. Will continue to monitor.

## 2019-12-31 ENCOUNTER — Inpatient Hospital Stay (HOSPITAL_COMMUNITY): Payer: Medicare Other

## 2019-12-31 LAB — BASIC METABOLIC PANEL
Anion gap: 8 (ref 5–15)
BUN: 15 mg/dL (ref 8–23)
CO2: 32 mmol/L (ref 22–32)
Calcium: 8.6 mg/dL — ABNORMAL LOW (ref 8.9–10.3)
Chloride: 99 mmol/L (ref 98–111)
Creatinine, Ser: 0.48 mg/dL (ref 0.44–1.00)
GFR calc Af Amer: >60 mL/min (ref >60)
GFR calc non Af Amer: >60 mL/min (ref >60)
Glucose, Bld: 215 mg/dL — ABNORMAL HIGH (ref 70–99)
Potassium: 3.6 mmol/L (ref 3.5–5.1)
Sodium: 139 mmol/L (ref 135–145)

## 2019-12-31 LAB — GLUCOSE, CAPILLARY
Glucose-Capillary: 185 mg/dL — ABNORMAL HIGH (ref 70–99)
Glucose-Capillary: 280 mg/dL — ABNORMAL HIGH (ref 70–99)
Glucose-Capillary: 255 mg/dL — ABNORMAL HIGH (ref 70–99)
Glucose-Capillary: 184 mg/dL — ABNORMAL HIGH (ref 70–99)

## 2019-12-31 LAB — MAGNESIUM: Magnesium: 1.7 mg/dL (ref 1.7–2.4)

## 2019-12-31 IMAGING — DX DG ABDOMEN 1V
1 series · 1 of 1 positions shown · non-contrast
Comparison: None.

CLINICAL DATA: History of AFib.

EXAM:
ABDOMEN - 1 VIEW

[abdomen]
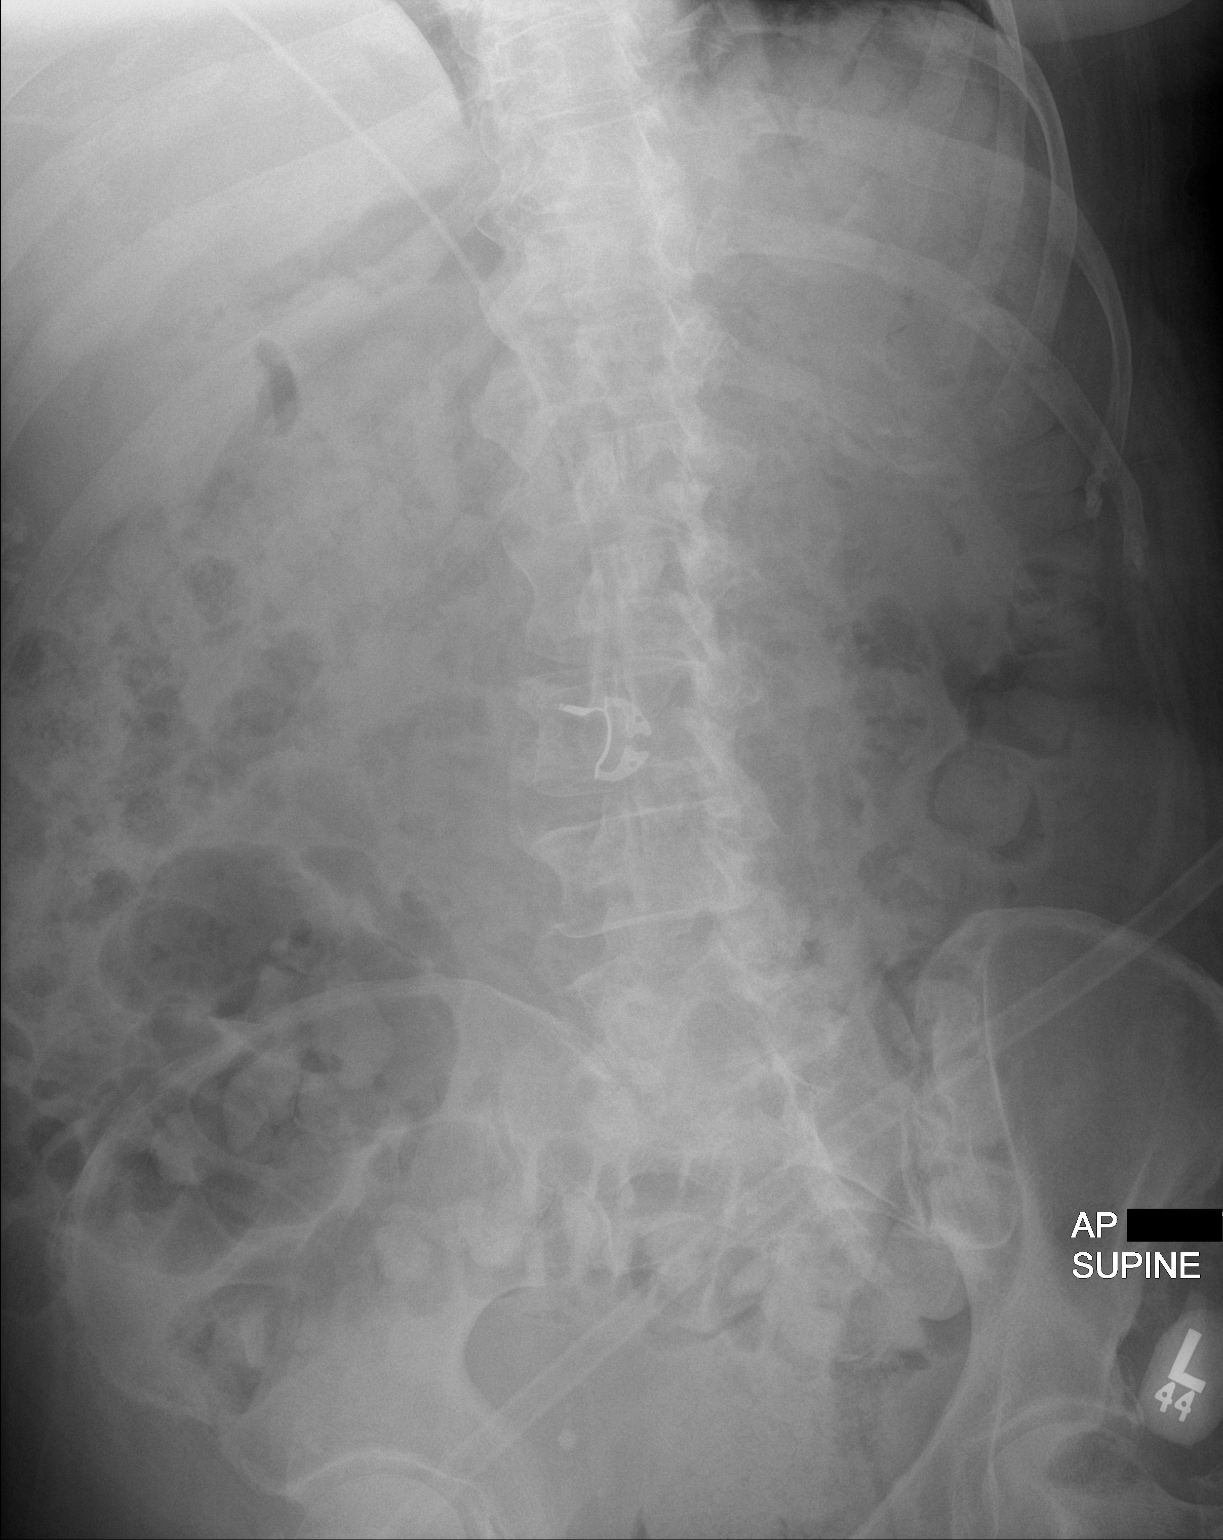

[1 of 1 positions shown; findings below may reference images not displayed]

FINDINGS: Patient is rotated to the right. Bowel gas pattern is nonobstructive
with mild fecal retention throughout the colon. No free peritoneal
air. Degenerative change of the spine. Calcification over the right
pelvis likely phleboliths.
IMPRESSION: Nonobstructive bowel gas pattern with mild fecal retention
throughout the colon.

## 2019-12-31 MED ORDER — CARVEDILOL 25 MG PO TABS
25.0000 mg | ORAL_TABLET | Freq: Two times a day (BID) | ORAL | Status: DC
  Administered 2020-01-01 – 2020-01-06 (×12): 25 mg via ORAL
  Filled 2019-12-31 (×12): qty 1

## 2019-12-31 MED ORDER — FLEET ENEMA 7-19 GM/118ML RE ENEM: 1.0000 | ENEMA | Freq: Once | RECTAL | Status: DC

## 2019-12-31 MED ORDER — DIPHENHYDRAMINE-ZINC ACETATE 2-0.1 % EX CREA
1.0000 "application " | TOPICAL_CREAM | Freq: Three times a day (TID) | CUTANEOUS | Status: DC | PRN
  Administered 2020-01-03: 1 via TOPICAL
  Filled 2019-12-31: qty 28

## 2019-12-31 MED ORDER — POTASSIUM CHLORIDE CRYS ER 20 MEQ PO TBCR
40.0000 meq | EXTENDED_RELEASE_TABLET | Freq: Once | ORAL | Status: AC
  Administered 2019-12-31: 40 meq via ORAL
  Filled 2019-12-31: qty 2

## 2019-12-31 MED ORDER — PANTOPRAZOLE SODIUM 40 MG PO TBEC
40.0000 mg | DELAYED_RELEASE_TABLET | Freq: Two times a day (BID) | ORAL | Status: DC
  Administered 2019-12-31 – 2020-01-06 (×12): 40 mg via ORAL
  Filled 2019-12-31 (×13): qty 1

## 2019-12-31 MED ORDER — MAGNESIUM SULFATE 2 GM/50ML IV SOLN
2.0000 g | Freq: Once | INTRAVENOUS | Status: AC
  Administered 2019-12-31: 2 g via INTRAVENOUS
  Filled 2019-12-31: qty 50

## 2019-12-31 MED ORDER — METOPROLOL TARTRATE 5 MG/5ML IV SOLN
5.0000 mg | Freq: Once | INTRAVENOUS | Status: AC
  Administered 2019-12-31: 5 mg via INTRAVENOUS
  Filled 2019-12-31: qty 5

## 2019-12-31 NOTE — Progress Notes (Signed)
Physical Therapy Treatment Patient Details Name: Janne Faulk MRN: [id number] DOB: 1945-12-15 Today's Date: 12/31/2019    History of Present Illness 74 Y/o with PMH of afib, bipolar, HTN, Obesity, DM, Pressure ulcer with recent admission for left buttock abscess s.p drainage brought to ED after arrest requiring brief CPR at nursing home.  pt was intubated in the field.  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  Pt extubated 5/11.    PT Comments    Pt was seen for mobility on stedy, with pt tolerating the process up until she was up on stedy.  Pt began to get upset, asking to get off the lift despite having a perch on the lift to rest.  Nursing in with rehab to help manage her incontinence and contaminated sacral wound bandage, and to reposition pt in a better place.  Pt is motivated to work, and do appreciate the chance to work on standing skills with nursing able to observe.  Follow acutely for these needs, will dc her to nursing home when ready.   Follow Up Recommendations  SNF     Equipment Recommendations  None recommended by PT    Recommendations for Other Services       Precautions / Restrictions Precautions Precautions: Fall Precaution Comments: sacral wound, stage IV with osteo Restrictions Weight Bearing Restrictions: No    Mobility  Bed Mobility Overal bed mobility: Needs Assistance Bed Mobility: Supine to Sit;Sit to Supine Rolling: +2 for physical assistance;+2 for safety/equipment Sidelying to sit: +2 for physical assistance;Total assist;+2 for safety/equipment Supine to sit: Total assist;+2 for physical assistance;+2 for safety/equipment Sit to supine: Total assist;+2 for physical assistance      Transfers Overall transfer level: Needs assistance   Transfers: Sit to/from Stand Sit to Stand: Total assist;+2 physical assistance;+2 safety/equipment;From elevated surface          General transfer comment: used bed pad to lift up from higher bed surface, requires max cues and then to sit had to bring stedy to bed with full scoot back onto bed   Ambulation/Gait             General Gait Details: unable   Stairs             Wheelchair Mobility    Modified Rankin (Stroke Patients Only)       Balance Overall balance assessment: Needs assistance Sitting-balance support: Feet supported Sitting balance-Leahy Scale: Poor Sitting balance - Comments: 4 Postural control: Posterior lean Standing balance support: Bilateral upper extremity supported;During functional activity Standing balance-Leahy Scale: Zero                              Cognition Arousal/Alertness: Awake/alert Behavior During Therapy: Flat affect Overall Cognitive Status: No family/caregiver present to determine baseline cognitive functioning Area of Impairment: Awareness;Safety/judgement;Following commands;Memory                 Orientation Level: Time Current Attention Level: Selective Memory: Decreased recall of precautions;Decreased short-term memory Following Commands: Follows one step commands inconsistently;Follows one step commands with increased time Safety/Judgement: Decreased awareness of deficits Awareness: Intellectual Problem Solving: Requires verbal cues;Requires tactile cues General Comments: initiation of standing was entirely by PT and OT and pt began to object after getting up on [facility]      Exercises      General Comments General comments (skin integrity, edema, etc.): stedy  standing for a short time due to pt objecting to the task once PT and OT assisted to stand      Pertinent Vitals/Pain Pain Assessment: Faces Faces Pain Scale: Hurts little more Pain Location: wound and joints Pain Descriptors / Indicators: Grimacing Pain Intervention(s): Repositioned;Monitored during session    Home Living                      Prior  Function            PT Goals (current goals can now be found in the care plan section) Acute Rehab PT Goals Patient Stated Goal: to get better Progress towards PT goals: Progressing toward goals    Frequency    Min 2X/week      PT Plan Current plan remains appropriate    Co-evaluation   Reason for Co-Treatment: For patient/therapist safety;To address functional/ADL transfers PT goals addressed during session: Mobility/safety with mobility;Balance;Proper use of DME        AM-PAC PT "6 Clicks" Mobility   Outcome Measure  Help needed turning from your back to your side while in a flat bed without using bedrails?: A Lot Help needed moving from lying on your back to sitting on the side of a flat bed without using bedrails?: A Lot Help needed moving to and from a bed to a chair (including a wheelchair)?: A Lot Help needed standing up from a chair using your arms (e.g., wheelchair or bedside chair)?: A Lot Help needed to walk in hospital room?: Total Help needed climbing 3-5 steps with a railing? : Total 6 Click Score: 10    End of Session Equipment Utilized During Treatment: Gait belt;Oxygen Activity Tolerance: Patient limited by fatigue;Treatment limited secondary to medical complications (Comment) Patient left: in bed;with bed alarm set;with nursing/sitter in room Nurse Communication: Mobility status PT Visit Diagnosis: Other abnormalities of gait and mobility (R26.89);Muscle weakness (generalized) (M62.81)     Time: 1455-1540 PT Time Calculation (min) (ACUTE ONLY): 45 min  Charges:  $Therapeutic Activity: 23-37 mins                 Ivar Drape 12/31/2019, 10:42 PM  Samul Dada, PT MS Acute Rehab Dept. Number: [facility] [id number] [facility] [phone]

## 2019-12-31 NOTE — Progress Notes (Addendum)
PROGRESS NOTE    Becky Hendricks  CHE:[id number] DOB: 1946/03/03 DOA: 12/20/2019 PCP: Roderic Scarce, MD   Brief Narrative: 74 year old with past medical history significant for A. fib on Eliquis, bipolar disorder, hypertension,  diabetes type 2 who presented after out of hospital cardiopulmonary arrest.  Patient was recently discharged on 12/17/2019 for stage IV pressure ulcer to be on Unasyn plus vancomycin until 6/10.  On 5/9, staff at the nursing home reported she didnt " look right" at lunch.  She was later found in her room with agonal  breathing without a pulse.  In the ED, SPO2 100% intubated, blood pressure 120/91 on levophed. , blood glucose 300, lactate of 6.  White blood cell 11.  ABG seven-point 4/60/27/42.  Troponin 76 and 161 6 hours later.  EKG normal sinus rhythm.  Patient was minimally responsive no sedation.  She was a started on Levophed for blood pressure support and transition to meropenem and admitted to [facility] service.  Subsequently weaned off of pressors.  Patient extubated.  Patient continued to be on IV antibiotics.  Patient transferred to the hospitalist service on 12/23/2019. Patient developed a rash, ID think it could be related to acetazolamide.  ID recommend to continue with current antibiotics. Patient developed tachycardia overnight, PA-C.  Cardiology has been consulted due to recent cardiac arrest.    Assessment & Plan:   Active Problems:   Pressure injury of skin   Respiratory failure with hypoxia (HCC)   Acute metabolic encephalopathy   Severe sepsis (HCC)   Osteomyelitis (HCC)   Abscess of multiple sites of buttock   AF (paroxysmal atrial fibrillation) (HCC)   Cardiac arrest (HCC)   Hyperphosphatemia   Edema of upper extremity  1-Cardiopulmonary arrest: Patient noted to have cardiac/pulmonary arrest on 12/20/2019 the nursing home require 1 or 2 minutes of CPR before ROSC.  -This was thought to be related to septic shock secondary to a stage IV ulcer with  osteomyelitis -Patient required IV pressors initially, she has been weaned off -2D echo ejection fraction 60 to 65% -Mild elevated troponin -CT angiogram negative for PE -Developed tachycardia and PACs overnight.  Cardiology has been consulted. Recommend to resume coreg. Cardiology aware of SVT event last night. Recommended to increase coreg as needed/    2-Severe sepsis with septic shock secondary to osteomyelitis of the sacrum coccygeal stage IV pressure ulcer, POA -Secondary to osteomyelitis of the sacrum and cocci. -Patient noted to have polymicrobial decubitus ulcer with a strep viridans bacteremia noted last admission. 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 -Continue with IV antibiotics; Unasyn and vancomycin  3-Acute Metabolic encephalopathy: Secondary to patient acute pulmonary arrest. Head negative for acute abnormalities EEG showed moderate to severe diffuse encephalopathy Patient was also noted to have encephalopathy during prior hospitalizations TSH, B12, normal, RPR non-reactive MRI of the head negative for acute stroke Mental status fluctuates.   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 Continue with IV antibiotics  5-Paroxysmal A. fib: Continue with Eliquis HR up to 140, Will give IV metoprolol. Increase coreg to home dose, next dose will be tomorrow. If HR still up after IV metoprolol, could consider cardizem  gtt 6-Bipolar:  Continue with Depakote 7-DM type II: continue with Lantus and sliding scale 8-Hyperphosphatemia: Continue with calcium binders.  9-Maculopapular rash: ID  reconsulted, they think it could be related to acetazolamide Continue with current antibiotics. Received IV Benadryl, Pepcid and prednisone Rash flank area and arm similar like yesterday.  Topical steroid apply.   10-Bilateral pleural Effusion, LE edema;  Continue with IV Lasix Chest x-ray 5/18: Show persistent right pleural effusion. Negative 2 L.   11-abdominal pain; KUB Non Obstructive bowel gas pattern mild feccal retention through the colon. Fleet enema ordered.  12-SVT; replete mg. On coreg.   Pressure Injury 12/20/19 Buttocks Right Stage 4 - Full thickness tissue loss with exposed bone, tendon or muscle. (Active)  12/20/19 2030  Location: Buttocks  Location Orientation: Right  Staging: Stage 4 - Full thickness tissue loss with exposed bone, tendon or muscle.  Wound Description (Comments):   Present on Admission: Yes     Pressure Injury 12/20/19 Buttocks Left;Proximal Stage 4 - Full thickness tissue loss with exposed bone, tendon or muscle. (Active)  12/20/19 2030  Location: Buttocks  Location Orientation: Left;Proximal  Staging: Stage 4 - Full thickness tissue loss with exposed bone, tendon or muscle.  Wound Description (Comments):   Present on Admission: Yes     Pressure Injury 12/20/19 Buttocks Left;Distal Stage 4 - Full thickness tissue loss with exposed bone, tendon or muscle. (Active)  12/20/19 2030  Location: Buttocks  Location Orientation: Left;Distal  Staging: Stage 4 - Full thickness tissue loss with exposed bone, tendon or muscle.  Wound Description (Comments):   Present on Admission: Yes     Nutrition Problem: Increased nutrient needs Etiology: wound healing    Signs/Symptoms: estimated needs    Interventions: (P) MVI, Premier Protein, Magic cup  Estimated body mass index is 25.75 kg/m as calculated from the following:   Height as of this encounter: 5\' 4"  (1.626 m).   Weight as of this encounter: 68 kg.   DVT prophylaxis: Eliquis Code  Status: Full Code Family Communication: No family at bedside.  Disposition Plan:  Status is: Inpatient  Remains inpatient appropriate because; patient is still requiring IV Lasix this for pleural effusion and volume overload.  Continue to monitor rash.  Cardiology evaluation   Dispo: The patient is from: Skilled nursing facility              Anticipated d/c is to: Nursing facility              Anticipated d/c date is: 2 or 3 days              Patient currently ; not medically stable         Consultants:   ID  Cardiology   Procedures:   ECHO;   Antimicrobials:  Unasyn Vancomycin  Subjective: She is complaining of cramping abdominal pain. 8/10 Rash same  Objective: Vitals:   12/30/19 1148 12/30/19 2013 12/31/19 0148 12/31/19 0441  BP: (!) 155/82 (!) 127/50  (!) 151/87  Pulse: 66 68  64  Resp: 16 18  18   Temp: 97.6 F (36.4 C) 98.4 F (36.9 C)  98.4 F (36.9 C)  TempSrc: Oral Oral    SpO2: 93% 97%  93%  Weight:   68 kg   Height:        Intake/Output Summary (Last 24 hours) at 12/31/2019 0944 Last data filed at 12/31/2019 0814 Gross per 24 hour  Intake 2190.35 ml  Output 825 ml  Net 1365.35 ml  Filed Weights   12/29/19 0456 12/30/19 0459 12/31/19 0148  Weight: 73.9 kg 66.2 kg 68 kg    Examination:  General exam: NAD Respiratory system: CTA Cardiovascular system: S 1, S 2 RRR Gastrointestinal system: BS present, soft, nt Central nervous system: Alert.  Extremities: trace edema Skin: Papular papular rash more pronounced on the torso and flank area, arm   Data Reviewed: I have personally reviewed following labs and imaging studies  CBC: Recent Labs  Lab 12/25/19 0644 12/26/19 0607 12/27/19 0427 12/29/19 0451 12/30/19 0423  WBC 5.6 7.6 6.5 6.5 8.0  NEUTROABS 2.5 3.3 3.2 3.8 4.6  HGB 8.1* 9.4* 10.1* 10.5* 11.2*  HCT 28.4* 32.4* 34.6* 35.3* 37.0  MCV 98.6 97.0 96.4 97.2 94.9  PLT 128* 159 171 177 350   Basic Metabolic Panel: Recent  Labs  Lab 12/25/19 0644 12/25/19 0644 12/26/19 0607 12/26/19 0607 12/27/19 0427 12/28/19 0516 12/29/19 0451 12/30/19 0423 12/31/19 0817  NA 139   < > 139   < > 141 140 142 141 139  K 3.7   < > 3.6   < > 3.3* 3.9 3.6 4.0 3.6  CL 96*   < > 94*   < > 100 105 107 101 99  CO2 35*   < > 36*   < > 33* 26 27 25  32  GLUCOSE 148*   < > 151*   < > 142* 165* 182* 177* 215*  BUN 7*   < > 6*   < > 7* 11 10 12 15   CREATININE 0.41*   < > 0.41*   < > 0.47 0.51 0.47 0.48 0.48  CALCIUM 8.0*   < > 8.3*   < > 8.7* 8.5* 8.6* 8.5* 8.6*  MG 1.5*  --  2.0  --   --   --   --  1.8 1.7   < > = values in this interval not displayed.   GFR: Estimated Creatinine Clearance: 60.2 mL/min (by C-G formula based on SCr of 0.48 mg/dL). Liver Function Tests: Recent Labs  Lab 12/29/19 0835  AST 22  ALT 19  ALKPHOS 76  BILITOT 0.4  PROT 6.0*  ALBUMIN 2.1*   No results for input(s): LIPASE, AMYLASE in the last 168 hours. Recent Labs  Lab 12/30/19 1019  AMMONIA 30   Coagulation Profile: No results for input(s): INR, PROTIME in the last 168 hours. Cardiac Enzymes: No results for input(s): CKTOTAL, CKMB, CKMBINDEX, TROPONINI in the last 168 hours. BNP (last 3 results) No results for input(s): PROBNP in the last 8760 hours. HbA1C: No results for input(s): HGBA1C in the last 72 hours. CBG: Recent Labs  Lab 12/30/19 0610 12/30/19 1119 12/30/19 1615 12/30/19 2102 12/31/19 0604  GLUCAP 202* 277* 175* 203* 185*   Lipid Profile: No results for input(s): CHOL, HDL, LDLCALC, TRIG, CHOLHDL, LDLDIRECT in the last 72 hours. Thyroid Function Tests: No results for input(s): TSH, T4TOTAL, FREET4, T3FREE, THYROIDAB in the last 72 hours. Anemia Panel: No results for input(s): VITAMINB12, FOLATE, FERRITIN, TIBC, IRON, RETICCTPCT in the last 72 hours. Sepsis Labs: No results for input(s): PROCALCITON, LATICACIDVEN in the last 168 hours.  No results found for this or any previous visit (from the past 240  hour(s)).       Radiology Studies: DG CHEST PORT 1 VIEW  Result Date: 12/29/2019 CLINICAL DATA:  Shortness of breath EXAM: PORTABLE CHEST 1 VIEW COMPARISON:  12/21/2019 FINDINGS: Interval extubation. Enteric tube is no longer present. Central line is no longer  present. Low lung volumes with persistent elevation of the right hemidiaphragm. Probable persistent right pleural effusion and right basilar atelectasis. Stable cardiomediastinal contours. IMPRESSION: Persistent elevation of the right hemidiaphragm with probable right pleural effusion and right basilar atelectasis. Electronically Signed   By: Guadlupe Spanish M.D.   On: 12/29/2019 09:59        Scheduled Meds: . apixaban  5 mg Oral BID  . atorvastatin  10 mg Oral Daily  . carvedilol  6.25 mg Oral BID WC  . divalproex  500 mg Oral BID  . furosemide  20 mg Intravenous Q12H  . hydrocortisone cream   Topical BID  . insulin aspart  0-20 Units Subcutaneous TID WC  . insulin glargine  5 Units Subcutaneous BH-q7a  . mouth rinse  15 mL Mouth Rinse BID  . multivitamin with minerals  1 tablet Oral Daily  . predniSONE  20 mg Oral QAC breakfast  . Ensure Max Protein  11 oz Oral Daily  . senna-docusate  1 tablet Oral Daily   Continuous Infusions: . sodium chloride Stopped (12/25/19 1231)  . sodium chloride 250 mL (12/29/19 2149)  . ampicillin-sulbactam (UNASYN) IV 3 g (12/31/19 0354)  . famotidine (PEPCID) IV 20 mg (12/31/19 0615)  . vancomycin 1,000 mg (12/31/19 0454)     LOS: 11 days    Time spent: 35 minutes    Becky Hendricks A Artina Minella, MD [facility]   If 7PM-7AM, please contact night-coverage [email or web address]  12/31/2019, 9:44 AM

## 2019-12-31 NOTE — Progress Notes (Signed)
[facility] for Infectious Disease    Date of Admission:  12/20/2019      ID: Becky Hendricks is a 74 y.o. female with  Active Problems:   Pressure injury of skin   Respiratory failure with hypoxia (HCC)   Acute metabolic encephalopathy   Severe sepsis (HCC)   Osteomyelitis (HCC)   Abscess of multiple sites of buttock   AF (paroxysmal atrial fibrillation) (HCC)   Cardiac arrest (HCC)   Hyperphosphatemia   Edema of upper extremity    Subjective: Afebrile. " The pills are working"  Medications:  . apixaban  5 mg Oral BID  . atorvastatin  10 mg Oral Daily  . carvedilol  6.25 mg Oral BID WC  . divalproex  500 mg Oral BID  . furosemide  20 mg Intravenous Q12H  . hydrocortisone cream   Topical BID  . insulin aspart  0-20 Units Subcutaneous TID WC  . insulin glargine  5 Units Subcutaneous BH-q7a  . mouth rinse  15 mL Mouth Rinse BID  . multivitamin with minerals  1 tablet Oral Daily  . pantoprazole  40 mg Oral BID  . predniSONE  20 mg Oral QAC breakfast  . Ensure Max Protein  11 oz Oral Daily  . senna-docusate  1 tablet Oral Daily    Objective: Vital signs in last 24 hours: Temp:  [98.4 F (36.9 C)-98.6 F (37 C)] 98.6 F (37 C) (05/20 1155) Pulse Rate:  [64-77] 77 (05/20 1155) Resp:  [18] 18 (05/20 1155) BP: (127-151)/(50-87) 141/72 (05/20 1155) SpO2:  [93 %-97 %] 97 % (05/20 1155) Weight:  [68 kg] 68 kg (05/20 0148) Physical Exam  Constitutional:  oriented to person,only. appears well-developed and well-nourished. No distress.  HENT: [place]/AT, PERRLA, no scleral icterus Mouth/Throat: Oropharynx is clear and moist. No oropharyngeal exudate.  Cardiovascular: Normal rate, regular rhythm and normal heart sounds. Exam reveals no gallop and no friction rub.  No murmur heard.  Pulmonary/Chest: Effort normal and breath sounds normal. No respiratory distress.  has no wheezes.  Neck = supple, no nuchal rigidity Abdominal: Soft. Bowel sounds are normal.  exhibits no  distension. There is no tenderness.  Lymphadenopathy: no cervical adenopathy. No axillary adenopathy Neurological: alert and oriented to person,  Skin: Skin is warm and dry. Macular papular rash to right arm, back, flanks sparing legs and abdomen and chest and face Psychiatric: a normal mood and affect.  behavior is normal.    Lab Results Recent Labs    12/29/19 0451 12/29/19 0451 12/30/19 0423 12/31/19 0817  WBC 6.5  --  8.0  --   HGB 10.5*  --  11.2*  --   HCT 35.3*  --  37.0  --   NA 142   < > 141 139  K 3.6   < > 4.0 3.6  CL 107   < > 101 99  CO2 27   < > 25 32  BUN 10   < > 12 15  CREATININE 0.47   < > 0.48 0.48   < > = values in this interval not displayed.   Liver Panel Recent Labs    12/29/19 0835  PROT 6.0*  ALBUMIN 2.1*  AST 22  ALT 19  ALKPHOS 76  BILITOT 0.4  BILIDIR <0.1  IBILI NOT CALCULATED   Sedimentation Rate No results for input(s): ESRSEDRATE in the last 72 hours. C-Reactive Protein No results for input(s): CRP in the last 72 hours.  Microbiology:  Studies/Results: DG Abd  1 View  Result Date: 12/31/2019 CLINICAL DATA:  History of AFib. EXAM: ABDOMEN - 1 VIEW COMPARISON:  None. FINDINGS: Patient is rotated to the right. Bowel gas pattern is nonobstructive with mild fecal retention throughout the colon. No free peritoneal air. Degenerative change of the spine. Calcification over the right pelvis likely phleboliths. IMPRESSION: Nonobstructive bowel gas pattern with mild fecal retention throughout the colon. Electronically Signed   By: Elberta Fortis M.D.   On: 12/31/2019 10:53     Assessment/Plan: Ischial osteomyelitis = continue current course of antibiotics with vancomycin plus amp/sub  Drug rash = appears unchanged, not spreading. Appears to tolerate for now  Pruritis = treat as needed  [facility] for Infectious Diseases Cell: [phone] Pager: [phone]  12/31/2019, 5:24 PM

## 2019-12-31 NOTE — Progress Notes (Signed)
Received pt with red MEWS. HR 130s - 140s. Pt asymptomatic. Ordered EKG done. Consulting civil engineer notified. Katherina Right NP on call paged. Ordered Lopressor 5mg  given. Red MEWS protocol initiated. Will continue to monitor

## 2019-12-31 NOTE — Progress Notes (Signed)
Occupational Therapy Treatment Patient Details Name: Francenia Hendricks MRN: [id number] DOB: 11-25-1945 Today's Date: 12/31/2019    History of present illness 74 Y/o with PMH of afib, bipolar, HTN, Obesity, DM, Pressure ulcer with recent admission for left buttock abscess s.p drainage brought to ED after arrest requiring brief CPR at nursing home.  pt was intubated in the field.  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  Pt extubated 5/11.   OT comments  Pt seen for OT follow up with focus on BADL mobility progression. Pt completed bed mobility at total A +2. Once sitting EOB, pt required total A to maintain sitting balance and has strong posterior lean. Stedy used for pre standing activity. Pt stood in stedy with total A +2 from therapists, with third person behind pt for truncal support- while raising bed in height to help lift hips. Pt able to tolerate standing ~1 minute before needing to sit back down. She was then incontinent at this time and required total A for peri care. RN then in room to address wound care. D/c recs remain appropriate, will continue to follow.    Follow Up Recommendations  SNF;Supervision/Assistance - 24 hour    Equipment Recommendations  None recommended by OT    Recommendations for Other Services      Precautions / Restrictions Precautions Precautions: Fall Precaution Comments: sacral wound, stage IV with osteo Restrictions Weight Bearing Restrictions: No       Mobility Bed Mobility Overal bed mobility: Needs Assistance Bed Mobility: Supine to Sit;Sit to Supine Rolling: +2 for physical assistance;+2 for safety/equipment;Total assist   Supine to sit: Total assist;+2 for physical assistance Sit to supine: Total assist;+2 for physical assistance   General bed mobility comments: She agrees to mobility, but then does not follow through with assistance. She requires total assist  for all bed mobility. Strong posterior lean with sitting.  Transfers Overall transfer level: Needs assistance   Transfers: Sit to/from Stand Sit to Stand: Total assist;+2 physical assistance;+2 safety/equipment         General transfer comment: pre standing activities completed in stedy. total A +2 with bed pad used to lift hips. bed lifted in height to assist in lifting patient hips as well. 3rd person behind pt to support posterior lean    Balance Overall balance assessment: Needs assistance Sitting-balance support: Feet supported Sitting balance-Leahy Scale: Zero Sitting balance - Comments: unable to sit EOB without total A +2 Postural control: Posterior lean                                 ADL either performed or assessed with clinical judgement   ADL Overall ADL's : Needs assistance/impaired                         Toilet Transfer: Total assistance;+2 for physical assistance;+2 for safety/equipment Toilet Transfer Details (indicate cue type and reason): total A +2 to stand in stedy, third person helping with trunk control prior to stand. Pt with little tolerance and difficulty obtaining hip extension Toileting- Clothing Manipulation and Hygiene: Total assistance Toileting - Clothing Manipulation Details (indicate cue type and reason): total A rolling in bed after BM     Functional mobility during ADLs: Total assistance;+2 for physical assistance General ADL Comments: session focused on OOB mobility progression. Stedy used in pre standing to  help progress toward BADL participation     Vision       Perception     Praxis      Cognition Arousal/Alertness: Awake/alert Behavior During Therapy: Flat affect Overall Cognitive Status: No family/caregiver present to determine baseline cognitive functioning Area of Impairment: Orientation;Attention;Memory;Following commands;Safety/judgement;Awareness;Problem solving                 Orientation  Level: Disoriented to;Place;Time;Situation Current Attention Level: Sustained Memory: Decreased recall of precautions;Decreased short-term memory Following Commands: Follows one step commands with increased time;Follows one step commands inconsistently Safety/Judgement: Decreased awareness of safety;Decreased awareness of deficits Awareness: Intellectual Problem Solving: Slow processing;Decreased initiation;Difficulty sequencing;Requires verbal cues;Requires tactile cues General Comments: pt will often be agreeable to a task but does not initiate movements. Noted difficulty finding words, becoming agitated with movement. Difficulty with consistent command follow, needs increased time and repetition        Exercises     Shoulder Instructions       General Comments      Pertinent Vitals/ Pain       Pain Assessment: Faces Faces Pain Scale: Hurts a little bit Pain Location: generalized discomfort with transfer Pain Descriptors / Indicators: Grimacing Pain Intervention(s): Monitored during session;Repositioned  Home Living                                          Prior Functioning/Environment              Frequency  Min 2X/week        Progress Toward Goals  OT Goals(current goals can now be found in the care plan section)  Progress towards OT goals: Progressing toward goals  Acute Rehab OT Goals Patient Stated Goal: to not be sick anymore OT Goal Formulation: With patient Time For Goal Achievement: 01/06/20 Potential to Achieve Goals: [place] Discharge plan remains appropriate    Co-evaluation    PT/OT/SLP Co-Evaluation/Treatment: Yes Reason for Co-Treatment: For patient/therapist safety;To address functional/ADL transfers   OT goals addressed during session: ADL's and self-care;Proper use of Adaptive equipment and DME;Strengthening/ROM      AM-PAC OT "6 Clicks" Daily Activity     Outcome Measure   Help from another person eating  meals?: A Lot Help from another person taking care of personal grooming?: A Lot Help from another person toileting, which includes using toliet, bedpan, or urinal?: Total Help from another person bathing (including washing, rinsing, drying)?: A Lot Help from another person to put on and taking off regular upper body clothing?: Total Help from another person to put on and taking off regular lower body clothing?: Total 6 Click Score: 9    End of Session Equipment Utilized During Treatment: Oxygen  OT Visit Diagnosis: Other abnormalities of gait and mobility (R26.89);Muscle weakness (generalized) (M62.81);Other symptoms and signs involving cognitive function;Pain Pain - part of body: (generalized)   Activity Tolerance Patient tolerated treatment well   Patient Left in bed;with call bell/phone within reach;with bed alarm set;with family/visitor present   Nurse Communication Mobility status;Need for lift equipment        Time: 1455-1543 OT Time Calculation (min): 48 min  Charges: OT General Charges $OT Visit: 1 Visit OT Treatments $Self Care/Home Management : 23-37 mins  Zenovia Jarred, MSOT, OTR/L Acute Rehabilitation Services [facility] Office Number: [phone] Pager: [phone]  Zenovia Jarred 12/31/2019, 5:03 PM

## 2019-12-31 NOTE — Progress Notes (Signed)
RN called to bedside by E-link nurse. Patient's HR in the 130s-140s. Spoke with Dr. Sunnie Nielsen, orders for 5 mg lopressor was given. EKG also performed.   Patient remained normotensive during this time. She remained alert and no complaints of pain. Charge RN was notified of MEWS score change to red.

## 2020-01-01 DIAGNOSIS — L270 Generalized skin eruption due to drugs and medicaments taken internally: Secondary | ICD-10-CM

## 2020-01-01 LAB — GLUCOSE, CAPILLARY
Glucose-Capillary: 166 mg/dL — ABNORMAL HIGH (ref 70–99)
Glucose-Capillary: 257 mg/dL — ABNORMAL HIGH (ref 70–99)
Glucose-Capillary: 164 mg/dL — ABNORMAL HIGH (ref 70–99)
Glucose-Capillary: 215 mg/dL — ABNORMAL HIGH (ref 70–99)

## 2020-01-01 LAB — BASIC METABOLIC PANEL
Anion gap: 8 (ref 5–15)
BUN: 16 mg/dL (ref 8–23)
CO2: 35 mmol/L — ABNORMAL HIGH (ref 22–32)
Calcium: 8.4 mg/dL — ABNORMAL LOW (ref 8.9–10.3)
Chloride: 99 mmol/L (ref 98–111)
Creatinine, Ser: 0.43 mg/dL — ABNORMAL LOW (ref 0.44–1.00)
GFR calc Af Amer: >60 mL/min (ref >60)
GFR calc non Af Amer: >60 mL/min (ref >60)
Glucose, Bld: 161 mg/dL — ABNORMAL HIGH (ref 70–99)
Potassium: 4 mmol/L (ref 3.5–5.1)
Sodium: 142 mmol/L (ref 135–145)

## 2020-01-01 LAB — CBC
HCT: 36.7 % (ref 36.0–46.0)
Hemoglobin: 10.8 g/dL — ABNORMAL LOW (ref 12.0–15.0)
MCH: 28.6 pg (ref 26.0–34.0)
MCHC: 29.4 g/dL — ABNORMAL LOW (ref 30.0–36.0)
MCV: 97.3 fL (ref 80.0–100.0)
Platelets: 176 10*3/uL (ref 150–400)
RBC: 3.77 MIL/uL — ABNORMAL LOW (ref 3.87–5.11)
RDW: 19.6 % — ABNORMAL HIGH (ref 11.5–15.5)
WBC: 8.4 10*3/uL (ref 4.0–10.5)
nRBC: 0 % (ref 0.0–0.2)

## 2020-01-01 MED ORDER — FUROSEMIDE 40 MG PO TABS
40.0000 mg | ORAL_TABLET | Freq: Every day | ORAL | Status: DC
  Administered 2020-01-03 – 2020-01-06 (×4): 40 mg via ORAL
  Filled 2020-01-01 (×5): qty 1

## 2020-01-01 MED ORDER — INSULIN GLARGINE 100 UNIT/ML ~~LOC~~ SOLN
8.0000 [IU] | Freq: Every day | SUBCUTANEOUS | Status: DC
  Administered 2020-01-02 – 2020-01-06 (×5): 8 [IU] via SUBCUTANEOUS
  Filled 2020-01-01 (×6): qty 0.08

## 2020-01-01 NOTE — Progress Notes (Signed)
[facility]    Date of Admission:  12/20/2019      ID: Becky Hendricks is a 74 y.o. female with sacral osteomyelitis Active Problems:   Pressure injury of skin   Respiratory failure with hypoxia (HCC)   Acute metabolic encephalopathy   Severe sepsis (HCC)   Osteomyelitis (HCC)   Abscess of multiple sites of buttock   AF (paroxysmal atrial fibrillation) (HCC)   Cardiac arrest (HCC)   Hyperphosphatemia   Edema of upper extremity    Subjective: Afebrile, rash not pruritic. Not worsening.  Medications:  . apixaban  5 mg Oral BID  . atorvastatin  10 mg Oral Daily  . carvedilol  25 mg Oral BID WC  . divalproex  500 mg Oral BID  . furosemide  20 mg Intravenous Q12H  . hydrocortisone cream   Topical BID  . insulin aspart  0-20 Units Subcutaneous TID WC  . insulin glargine  5 Units Subcutaneous BH-q7a  . mouth rinse  15 mL Mouth Rinse BID  . multivitamin with minerals  1 tablet Oral Daily  . pantoprazole  40 mg Oral BID  . Ensure Max Protein  11 oz Oral Daily  . senna-docusate  1 tablet Oral Daily    Objective: Vital signs in last 24 hours: Temp:  [97.5 F (36.4 C)-98.5 F (36.9 C)] 98 F (36.7 C) (05/21 0754) Pulse Rate:  [43-142] 60 (05/21 0754) Resp:  [18-23] 19 (05/21 1100) BP: (100-154)/(60-116) 144/67 (05/21 0754) SpO2:  [97 %-100 %] 100 % (05/21 0754) Weight:  [70.3 kg] 70.3 kg (05/21 0150)  Physical Exam  Constitutional:  oriented to person, only. appears well-developed and well-nourished. No distress.  HENT: [place]/AT, PERRLA, no scleral icterus Mouth/Throat: Oropharynx is clear and moist. No oropharyngeal exudate.  Cardiovascular: Normal rate, regular rhythm and normal heart sounds. Exam reveals no gallop and no friction rub.  No murmur heard.  Pulmonary/Chest: Effort normal and breath sounds normal. No respiratory distress.  has no wheezes.  Neck = supple, no nuchal rigidity Abdominal: Soft. Bowel sounds are normal.  exhibits no  distension. There is no tenderness.  Neurological: alert and oriented to person, only Skin: Skin is warm and dry. Maculopapular rash on right arm, flanks and back - unchanged Psychiatric: a normal mood and affect.  behavior is normal.    Lab Results Recent Labs    12/30/19 0423 12/30/19 0423 12/31/19 0817 01/01/20 0512  WBC 8.0  --   --  8.4  HGB 11.2*  --   --  10.8*  HCT 37.0  --   --  36.7  NA 141   < > 139 142  K 4.0   < > 3.6 4.0  CL 101   < > 99 99  CO2 25   < > 32 35*  BUN 12   < > 15 16  CREATININE 0.48   < > 0.48 0.43*   < > = values in this interval not displayed.   Microbiology: reviewed Studies/Results: DG Abd 1 View  Result Date: 12/31/2019 CLINICAL DATA:  History of AFib. EXAM: ABDOMEN - 1 VIEW COMPARISON:  None. FINDINGS: Patient is rotated to the right. Bowel gas pattern is nonobstructive with mild fecal retention throughout the colon. No free peritoneal air. Degenerative change of the spine. Calcification over the right pelvis likely phleboliths. IMPRESSION: Nonobstructive bowel gas pattern with mild fecal retention throughout the colon. Electronically Signed   By: Elberta Fortis M.D.   On: 12/31/2019  10:53     Assessment/Plan: Sacral osteomyelitis = continue on vancomycin and amp/sub through June 10th. Pharmacy has been following her vanco trough and kidney function to ensure proper dosing. She is to follow on vanco protocol once discharged from hospital.   Drug rash = appears to be slowly resolving. Thought to be due to acetazolamide, not current abtx. If it worsens, please re-consult [place].  Will sign off.  Will arrange follow-up, I believe to see dr Lucianne Lei dam in follow up around mid [place] [facility] Cell: [phone] Pager: [phone]  01/01/2020, 12:17 PM

## 2020-01-01 NOTE — Progress Notes (Signed)
PROGRESS NOTE    Becky Hendricks  NFA:[id number] DOB: Feb 17, 1946 DOA: 12/20/2019 PCP: Roderic Scarce, MD   Brief Narrative: 74 year old with past medical history significant for A. fib on Eliquis, bipolar disorder, hypertension,  diabetes type 2 who presented after out of hospital cardiopulmonary arrest.  Patient was recently discharged on 12/17/2019 for stage IV pressure ulcer to be on Unasyn plus vancomycin until 6/10.  On 5/9, staff at the nursing home reported she didnt " look right" at lunch.  She was later found in her room with agonal  breathing without a pulse.  In the ED, SPO2 100% intubated, blood pressure 120/91 on levophed. , blood glucose 300, lactate of 6.  White blood cell 11.  ABG seven-point 4/60/27/42.  Troponin 76 and 161 6 hours later.  EKG normal sinus rhythm.  Patient was minimally responsive no sedation.  She was a started on Levophed for blood pressure support and transition to meropenem and admitted to [facility] service.  Subsequently weaned off of pressors.  Patient extubated.  Patient continued to be on IV antibiotics.  Patient transferred to the hospitalist service on 12/23/2019. Patient developed a rash, ID think it could be related to acetazolamide.  ID recommend to continue with current antibiotics. Patient developed tachycardia overnight, PA-C.  Cardiology has been consulted due to recent cardiac arrest. Afib RVR overnight (01/01/2020) received IV metoprolol times 2. Coreg increase to 25 mg BID>   Assessment & Plan:   Active Problems:   Pressure injury of skin   Respiratory failure with hypoxia (HCC)   Acute metabolic encephalopathy   Severe sepsis (HCC)   Osteomyelitis (HCC)   Abscess of multiple sites of buttock   AF (paroxysmal atrial fibrillation) (HCC)   Cardiac arrest (HCC)   Hyperphosphatemia   Edema of upper extremity  1-Cardiopulmonary arrest: Patient noted to have cardiac/pulmonary arrest on 12/20/2019 the nursing home require 1 or 2 minutes of CPR before  ROSC.  -This was thought to be related to septic shock secondary to a stage IV ulcer with osteomyelitis -Patient required IV pressors initially, she has been weaned off -2D echo ejection fraction 60 to 65% -Mild elevated troponin -CT angiogram negative for PE -Developed tachycardia and PACs overnight.  Cardiology has been consulted. Recommend to resume coreg. Cardiology aware of SVT event last night. Recommended to increase coreg as needed/    2-Severe sepsis with septic shock secondary to osteomyelitis of the sacrum coccygeal stage IV pressure ulcer, POA -Secondary to osteomyelitis of the sacrum and cocci. -Patient noted to have polymicrobial decubitus ulcer with a strep viridans bacteremia noted last admission. 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 -Continue with IV antibiotics; Unasyn and vancomycin. Monitor for worsening of rash.   3-Acute Metabolic encephalopathy: Secondary to patient acute pulmonary arrest. Head negative for acute abnormalities EEG showed moderate to severe diffuse encephalopathy Patient was also noted to have encephalopathy during prior hospitalizations TSH, B12, normal, RPR non-reactive MRI of the head negative for acute stroke Mental status fluctuates.   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 Continue with IV antibiotics. When rash improved with proceed with central line to be able to be discharge to  SNF  5-Paroxysmal A. fib: Continue with Eliquis HR up to 140, received  IV metoprolol. Increase coreg.  HR better controlled today.   6-Bipolar:  Continue with  Depakote 7-DM type II: continue with Lantus and sliding scale 8-Hyperphosphatemia: Continue with calcium binders.  9-Maculopapular rash: ID reconsulted, they think it could be related to acetazolamide Continue with current antibiotics. Received IV Benadryl, Pepcid and prednisone Rash stable.  Topical steroid apply.   10-Bilateral pleural Effusion, LE edema;  Treated  with IV Lasix Chest x-ray 5/18: Show persistent right pleural effusion. Negative 2.7 L.  Change lasix to oral.   11-abdominal pain; KUB Non Obstructive bowel gas pattern mild feccal retention through the colon. Fleet enema ordered. Had multiples BM./   12-SVT; replete mg. On coreg.   Pressure Injury 12/20/19 Buttocks Right Stage 4 - Full thickness tissue loss with exposed bone, tendon or muscle. (Active)  12/20/19 2030  Location: Buttocks  Location Orientation: Right  Staging: Stage 4 - Full thickness tissue loss with exposed bone, tendon or muscle.  Wound Description (Comments):   Present on Admission: Yes     Pressure Injury 12/20/19 Buttocks Left;Proximal Stage 4 - Full thickness tissue loss with exposed bone, tendon or muscle. (Active)  12/20/19 2030  Location: Buttocks  Location Orientation: Left;Proximal  Staging: Stage 4 - Full thickness tissue loss with exposed bone, tendon or muscle.  Wound Description (Comments):   Present on Admission: Yes     Pressure Injury 12/20/19 Buttocks Left;Distal Stage 4 - Full thickness tissue loss with exposed bone, tendon or muscle. (Active)  12/20/19 2030  Location: Buttocks  Location Orientation: Left;Distal  Staging: Stage 4 - Full thickness tissue loss with exposed bone, tendon or muscle.  Wound Description (Comments):   Present on Admission: Yes     Nutrition Problem: Increased nutrient needs Etiology: wound healing    Signs/Symptoms: estimated needs    Interventions: (P) MVI, Premier Protein, Magic cup  Estimated body mass index is 26.61 kg/m as  calculated from the following:   Height as of this encounter: 5\' 4"  (1.626 m).   Weight as of this encounter: 70.3 kg.   DVT prophylaxis: Eliquis Code Status: Full Code Family Communication: No family at bedside.  Disposition Plan:  Status is: Inpatient  Remains inpatient appropriate because; patient is still requiring IV Lasix this for pleural effusion and volume overload.  Continue to monitor rash.  Cardiology evaluation   Dispo: The patient is from: Skilled nursing facility              Anticipated d/c is to: Nursing facility              Anticipated d/c date is: 2 or 3 days, when able to place PICC line, central line chest when rash improved.               Patient currently ; not medically stable         Consultants:   ID  Cardiology   Procedures:   ECHO;   Antimicrobials:  Unasyn Vancomycin  Subjective: She denies abdominal pain today.  She had multiples BM Confuse, able to answer questions.   Objective: Vitals:   01/01/20 0230 01/01/20 0506 01/01/20 0754 01/01/20 1100  BP: 131/74 126/60 (!) 144/67   Pulse: 61 63 60   Resp:  18 (!) 21 19  Temp:  98.5 F (36.9 C) 98 F (36.7 C)   TempSrc:  Oral Oral   SpO2:  100% 100%   Weight:  Height:        Intake/Output Summary (Last 24 hours) at 01/01/2020 1452 Last data filed at 01/01/2020 1311 Gross per 24 hour  Intake 360 ml  Output 425 ml  Net -65 ml   Filed Weights   12/30/19 0459 12/31/19 0148 01/01/20 0150  Weight: 66.2 kg 68 kg 70.3 kg    Examination:  General exam: NAD Respiratory system: CTA Cardiovascular system: S 1, S 2 RRR Gastrointestinal system: BS present, soft, nt Central nervous system: Alert Extremities: Trace edema Skin: Papular papular rash more pronounced on the torso and flank area, arm stable.    Data Reviewed: I have personally reviewed following labs and imaging studies  CBC: Recent Labs  Lab 12/26/19 0607 12/27/19 0427 12/29/19 0451 12/30/19 0423  01/01/20 0512  WBC 7.6 6.5 6.5 8.0 8.4  NEUTROABS 3.3 3.2 3.8 4.6  --   HGB 9.4* 10.1* 10.5* 11.2* 10.8*  HCT 32.4* 34.6* 35.3* 37.0 36.7  MCV 97.0 96.4 97.2 94.9 97.3  PLT 159 171 177 191 176   Basic Metabolic Panel: Recent Labs  Lab 12/26/19 0607 12/27/19 0427 12/28/19 0516 12/29/19 0451 12/30/19 0423 12/31/19 0817 01/01/20 0512  NA 139   < > 140 142 141 139 142  K 3.6   < > 3.9 3.6 4.0 3.6 4.0  CL 94*   < > 105 107 101 99 99  CO2 36*   < > 26 27 25  32 35*  GLUCOSE 151*   < > 165* 182* 177* 215* 161*  BUN 6*   < > 11 10 12 15 16   CREATININE 0.41*   < > 0.51 0.47 0.48 0.48 0.43*  CALCIUM 8.3*   < > 8.5* 8.6* 8.5* 8.6* 8.4*  MG 2.0  --   --   --  1.8 1.7  --    < > = values in this interval not displayed.   GFR: Estimated Creatinine Clearance: 61.1 mL/min (A) (by C-G formula based on SCr of 0.43 mg/dL (L)). Liver Function Tests: Recent Labs  Lab 12/29/19 0835  AST 22  ALT 19  ALKPHOS 76  BILITOT 0.4  PROT 6.0*  ALBUMIN 2.1*   No results for input(s): LIPASE, AMYLASE in the last 168 hours. Recent Labs  Lab 12/30/19 1019  AMMONIA 30   Coagulation Profile: No results for input(s): INR, PROTIME in the last 168 hours. Cardiac Enzymes: No results for input(s): CKTOTAL, CKMB, CKMBINDEX, TROPONINI in the last 168 hours. BNP (last 3 results) No results for input(s): PROBNP in the last 8760 hours. HbA1C: No results for input(s): HGBA1C in the last 72 hours. CBG: Recent Labs  Lab 12/31/19 1119 12/31/19 1609 12/31/19 2120 01/01/20 0623 01/01/20 1120  GLUCAP 280* 255* 184* 166* 257*   Lipid Profile: No results for input(s): CHOL, HDL, LDLCALC, TRIG, CHOLHDL, LDLDIRECT in the last 72 hours. Thyroid Function Tests: No results for input(s): TSH, T4TOTAL, FREET4, T3FREE, THYROIDAB in the last 72 hours. Anemia Panel: No results for input(s): VITAMINB12, FOLATE, FERRITIN, TIBC, IRON, RETICCTPCT in the last 72 hours. Sepsis Labs: No results for input(s):  PROCALCITON, LATICACIDVEN in the last 168 hours.  No results found for this or any previous visit (from the past 240 hour(s)).       Radiology Studies: DG Abd 1 View  Result Date: 12/31/2019 CLINICAL DATA:  History of AFib. EXAM: ABDOMEN - 1 VIEW COMPARISON:  None. FINDINGS: Patient is rotated to the right. Bowel gas pattern is nonobstructive with mild fecal retention throughout the  colon. No free peritoneal air. Degenerative change of the spine. Calcification over the right pelvis likely phleboliths. IMPRESSION: Nonobstructive bowel gas pattern with mild fecal retention throughout the colon. Electronically Signed   By: Marin Olp M.D.   On: 12/31/2019 10:53        Scheduled Meds: . apixaban  5 mg Oral BID  . atorvastatin  10 mg Oral Daily  . carvedilol  25 mg Oral BID WC  . divalproex  500 mg Oral BID  . furosemide  20 mg Intravenous Q12H  . hydrocortisone cream   Topical BID  . insulin aspart  0-20 Units Subcutaneous TID WC  . insulin glargine  5 Units Subcutaneous BH-q7a  . mouth rinse  15 mL Mouth Rinse BID  . multivitamin with minerals  1 tablet Oral Daily  . pantoprazole  40 mg Oral BID  . Ensure Max Protein  11 oz Oral Daily  . senna-docusate  1 tablet Oral Daily   Continuous Infusions: . sodium chloride Stopped (12/25/19 1231)  . sodium chloride 250 mL (01/01/20 1037)  . ampicillin-sulbactam (UNASYN) IV 3 g (01/01/20 1040)  . vancomycin 1,000 mg (01/01/20 0503)     LOS: 12 days    Time spent: 35 minutes    Elford Evilsizer A Leathia Farnell, MD [facility]   If 7PM-7AM, please contact night-coverage [email or web address]  01/01/2020, 2:52 PM

## 2020-01-01 NOTE — Progress Notes (Signed)
Physical Therapy Treatment Patient Details Name: Becky Hendricks MRN: [id number] DOB: Jan 07, 1946 Today's Date: 01/01/2020    History of Present Illness 74 Y/o with PMH of afib, bipolar, HTN, Obesity, DM, Pressure ulcer with recent admission for left buttock abscess s.p drainage brought to ED after arrest requiring brief CPR at nursing home.  pt was intubated in the field.  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  Pt extubated 5/11.    PT Comments    Pt was assisted to get up \\higher  in bed to create a chair posture for her meal.  Refused to get up today with PT, but was repositioned to get into more functional posture to have lunch.  Total assist to move, not assisting PT to roll or shift wgt.  Follow acutely for strengthening and will expect her to transfer to SNF to make further balance, endurance and standing progress.   Follow Up Recommendations  SNF     Equipment Recommendations  None recommended by PT    Recommendations for Other Services       Precautions / Restrictions Precautions Precautions: Fall Precaution Comments: sacral wound, stage IV with osteo Restrictions Weight Bearing Restrictions: No    Mobility  Bed Mobility Overal bed mobility: Needs Assistance Bed Mobility: Rolling;Supine to Sit(scooting ) Rolling: Mod assist   Supine to sit: Total assist;+2 for physical assistance;+2 for safety/equipment     General bed mobility comments: pt is not assisting PT to move, very passive  Transfers                 General transfer comment: refuses  Ambulation/Gait                 Stairs             Wheelchair Mobility    Modified Rankin (Stroke Patients Only)       Balance     Sitting balance-Leahy Scale: Poor                                      Cognition Arousal/Alertness: Awake/alert Behavior During Therapy: Flat affect Overall  Cognitive Status: No family/caregiver present to determine baseline cognitive functioning Area of Impairment: Awareness;Safety/judgement;Problem solving                         Safety/Judgement: Decreased awareness of safety;Decreased awareness of deficits Awareness: Intellectual Problem Solving: Slow processing;Requires verbal cues;Requires tactile cues General Comments: assisted pt with posture and position in bed, she is unable to help move well at all      Exercises      General Comments        Pertinent Vitals/Pain Pain Assessment: Faces Pain Score: 6  Faces Pain Scale: Hurts even more Pain Location: wound and joints Pain Descriptors / Indicators: Grimacing Pain Intervention(s): Limited activity within patient's tolerance;Repositioned    Home Living                      Prior Function            PT Goals (current goals can now be found in the care plan section) Acute Rehab PT Goals Patient Stated Goal: feel better, to rest Progress towards PT goals: Not progressing toward goals - comment    Frequency    Min 2X/week  PT Plan Current plan remains appropriate    Co-evaluation              AM-PAC PT "6 Clicks" Mobility   Outcome Measure  Help needed turning from your back to your side while in a flat bed without using bedrails?: A Lot Help needed moving from lying on your back to sitting on the side of a flat bed without using bedrails?: A Lot Help needed moving to and from a bed to a chair (including a wheelchair)?: A Lot Help needed standing up from a chair using your arms (e.g., wheelchair or bedside chair)?: A Lot Help needed to walk in hospital room?: Total Help needed climbing 3-5 steps with a railing? : Total 6 Click Score: 10    End of Session Equipment Utilized During Treatment: Oxygen Activity Tolerance: Patient limited by fatigue Patient left: in bed;with call bell/phone within reach;with bed alarm set Nurse  Communication: Mobility status PT Visit Diagnosis: Other abnormalities of gait and mobility (R26.89);Muscle weakness (generalized) (M62.81)     Time: 9432-7614 PT Time Calculation (min) (ACUTE ONLY): 12 min  Charges:  $Therapeutic Activity: 8-22 mins          Ivar Drape 01/01/2020, 10:22 PM  Samul Dada, PT MS Acute Rehab Dept. Number: [facility] [id number] and [facility] [phone]

## 2020-01-01 NOTE — Progress Notes (Signed)
Pharmacy Antibiotic Note  Becky Hendricks is a 74 y.o. female admitted on 12/20/2019 with AMS and brief cardiac arrest.  Pharmacy has been consulted for vancomycin and ampicllin/sulbactam for osteomyelitis/bascess of the coccyx.  CT this admission showed decrease in size of left buttock fistula and abscess.   Patient afebrile, WBC wnl, Scr stable at 0.4 Last VT 18 at goal.  Next vancomycin trough scheduled for 5/24.  Plan: Vancomycin 1g IV Q12H Unasyn 3g IV Q6h Monitor clinical progress, c/s, renal function F/u vancomycin levels weekly and as indicated Abx end date 01/21/20   Height: 5\' 4"  (162.6 cm) Weight: 70.3 kg (155 lb) IBW/kg (Calculated) : 54.7  Temp (24hrs), Avg:98.1 F (36.7 C), Min:97.5 F (36.4 C), Max:98.6 F (37 C)  Recent Labs  Lab 12/26/19 0607 12/26/19 0607 12/27/19 0427 12/27/19 0427 12/28/19 0516 12/29/19 0451 12/30/19 0423 12/31/19 0817 01/01/20 0512  WBC 7.6  --  6.5  --   --  6.5 8.0  --  8.4  CREATININE 0.41*   < > 0.47   < > 0.51 0.47 0.48 0.48 0.43*  VANCOTROUGH  --   --  21*  --   --   --   --   --   --    < > = values in this interval not displayed.    Estimated Creatinine Clearance: 61.1 mL/min (A) (by C-G formula based on SCr of 0.43 mg/dL (L)).    Antimicrobials: Vanc 4/22 >>(6/10) Unasyn 4/28 >>(6/10) Merrem 5/9 >> 5/11  Microbiology: 5/9 covid / flu - negative 5/9 UCx - neg 5/9  BCx - neg  Data from previous admit: 4/25 VT = 15 >> continue 5/2 VT 8 - adjust to 1g q12 5/6 corrected VT~16 - cont current dose 5/16 corrected VT 18 - cont current dose  4/22 BC: 1/3 viridan strep 4/22 UCx >>neg 4/23 L-buttock abscess: rare Staphylococcus warneri, Strep anginosis 4/23 MRSA PCR - positive   5/23 Pharm.D. CPP, BCPS Clinical Pharmacist [phone] 01/01/2020 10:30 AM

## 2020-01-02 DIAGNOSIS — Z7189 Other specified counseling: Secondary | ICD-10-CM

## 2020-01-02 DIAGNOSIS — Z515 Encounter for palliative care: Secondary | ICD-10-CM

## 2020-01-02 LAB — BASIC METABOLIC PANEL
Anion gap: 9 (ref 5–15)
BUN: 13 mg/dL (ref 8–23)
CO2: 35 mmol/L — ABNORMAL HIGH (ref 22–32)
Calcium: 8.2 mg/dL — ABNORMAL LOW (ref 8.9–10.3)
Chloride: 97 mmol/L — ABNORMAL LOW (ref 98–111)
Creatinine, Ser: 0.41 mg/dL — ABNORMAL LOW (ref 0.44–1.00)
GFR calc Af Amer: >60 mL/min (ref >60)
GFR calc non Af Amer: >60 mL/min (ref >60)
Glucose, Bld: 252 mg/dL — ABNORMAL HIGH (ref 70–99)
Potassium: 4.4 mmol/L (ref 3.5–5.1)
Sodium: 141 mmol/L (ref 135–145)

## 2020-01-02 LAB — GLUCOSE, CAPILLARY
Glucose-Capillary: 160 mg/dL — ABNORMAL HIGH (ref 70–99)
Glucose-Capillary: 239 mg/dL — ABNORMAL HIGH (ref 70–99)
Glucose-Capillary: 235 mg/dL — ABNORMAL HIGH (ref 70–99)
Glucose-Capillary: 212 mg/dL — ABNORMAL HIGH (ref 70–99)

## 2020-01-02 NOTE — Progress Notes (Signed)
PROGRESS NOTE    Becky Hendricks  NFA:[id number] DOB: Feb 17, 1946 DOA: 12/20/2019 PCP: Roderic Scarce, MD   Brief Narrative: 74 year old with past medical history significant for A. fib on Eliquis, bipolar disorder, hypertension,  diabetes type 2 who presented after out of hospital cardiopulmonary arrest.  Patient was recently discharged on 12/17/2019 for stage IV pressure ulcer to be on Unasyn plus vancomycin until 6/10.  On 5/9, staff at the nursing home reported she didnt " look right" at lunch.  She was later found in her room with agonal  breathing without a pulse.  In the ED, SPO2 100% intubated, blood pressure 120/91 on levophed. , blood glucose 300, lactate of 6.  White blood cell 11.  ABG seven-point 4/60/27/42.  Troponin 76 and 161 6 hours later.  EKG normal sinus rhythm.  Patient was minimally responsive no sedation.  She was a started on Levophed for blood pressure support and transition to meropenem and admitted to [facility] service.  Subsequently weaned off of pressors.  Patient extubated.  Patient continued to be on IV antibiotics.  Patient transferred to the hospitalist service on 12/23/2019. Patient developed a rash, ID think it could be related to acetazolamide.  ID recommend to continue with current antibiotics. Patient developed tachycardia overnight, PA-C.  Cardiology has been consulted due to recent cardiac arrest. Afib RVR overnight (01/01/2020) received IV metoprolol times 2. Coreg increase to 25 mg BID>   Assessment & Plan:   Active Problems:   Pressure injury of skin   Respiratory failure with hypoxia (HCC)   Acute metabolic encephalopathy   Severe sepsis (HCC)   Osteomyelitis (HCC)   Abscess of multiple sites of buttock   AF (paroxysmal atrial fibrillation) (HCC)   Cardiac arrest (HCC)   Hyperphosphatemia   Edema of upper extremity  1-Cardiopulmonary arrest: Patient noted to have cardiac/pulmonary arrest on 12/20/2019 the nursing home require 1 or 2 minutes of CPR before  ROSC.  -This was thought to be related to septic shock secondary to a stage IV ulcer with osteomyelitis -Patient required IV pressors initially, she has been weaned off -2D echo ejection fraction 60 to 65% -Mild elevated troponin -CT angiogram negative for PE -Developed tachycardia and PACs overnight.  Cardiology has been consulted. Recommend to resume coreg. Cardiology aware of SVT event last night. Recommended to increase coreg as needed/    2-Severe sepsis with septic shock secondary to osteomyelitis of the sacrum coccygeal stage IV pressure ulcer, POA -Secondary to osteomyelitis of the sacrum and cocci. -Patient noted to have polymicrobial decubitus ulcer with a strep viridans bacteremia noted last admission. 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 -Continue with IV antibiotics; Unasyn and vancomycin. Monitor for worsening of rash.   3-Acute Metabolic encephalopathy: Secondary to patient acute pulmonary arrest. Head negative for acute abnormalities EEG showed moderate to severe diffuse encephalopathy Patient was also noted to have encephalopathy during prior hospitalizations TSH, B12, normal, RPR non-reactive MRI of the head negative for acute stroke Mental status fluctuates.   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 Continue with IV antibiotics. When rash improved with proceed with central line to be able to be discharge to  SNF  5-Paroxysmal A. fib: Continue with Eliquis HR up to 140, received  IV metoprolol. Increase coreg.  HR better controlled  6-Bipolar:  Continue with Depakote 7-DM type  II: continue with Lantus and sliding scale 8-Hyperphosphatemia: Continue with calcium binders.  9-Maculopapular rash: ID reconsulted, they think it could be related to acetazolamide Continue with current antibiotics. Received IV Benadryl, Pepcid and prednisone Rash improved.  Topical steroid apply.   10-Bilateral pleural Effusion, LE edema;  Treated  with IV Lasix Chest x-ray 5/18: Show persistent right pleural effusion. Negative 2.7 L.  Change lasix to oral.   11-abdominal pain; KUB Non Obstructive bowel gas pattern mild feccal retention through the colon. Fleet enema ordered. Had multiples BM./   12-SVT; replete mg. On coreg.   Pressure Injury 12/20/19 Buttocks Right Stage 4 - Full thickness tissue loss with exposed bone, tendon or muscle. (Active)  12/20/19 2030  Location: Buttocks  Location Orientation: Right  Staging: Stage 4 - Full thickness tissue loss with exposed bone, tendon or muscle.  Wound Description (Comments):   Present on Admission: Yes     Pressure Injury 12/20/19 Buttocks Left;Proximal Stage 4 - Full thickness tissue loss with exposed bone, tendon or muscle. (Active)  12/20/19 2030  Location: Buttocks  Location Orientation: Left;Proximal  Staging: Stage 4 - Full thickness tissue loss with exposed bone, tendon or muscle.  Wound Description (Comments):   Present on Admission: Yes     Pressure Injury 12/20/19 Buttocks Left;Distal Stage 4 - Full thickness tissue loss with exposed bone, tendon or muscle. (Active)  12/20/19 2030  Location: Buttocks  Location Orientation: Left;Distal  Staging: Stage 4 - Full thickness tissue loss with exposed bone, tendon or muscle.  Wound Description (Comments):   Present on Admission: Yes     Nutrition Problem: Increased nutrient needs Etiology: wound healing    Signs/Symptoms: estimated needs    Interventions: (P) MVI, Premier Protein, Magic cup  Estimated body mass index is 26.61 kg/m as calculated from the  following:   Height as of this encounter: 5\' 4"  (1.626 m).   Weight as of this encounter: 70.3 kg.   DVT prophylaxis: Eliquis Code Status: Full Code Family Communication: Brother at bedside.  Disposition Plan:  Status is: Inpatient  Remains inpatient appropriate because; patient is still requiring IV Lasix this for pleural effusion and volume overload.  Continue to monitor rash.  Cardiology evaluation   Dispo: The patient is from: Skilled nursing facility              Anticipated d/c is to: Nursing facility              Anticipated d/c date is: 2 or 3 days, when able to place PICC line, central line chest when rash improved.               Patient currently ; not medically stable         Consultants:   ID  Cardiology   Procedures:   ECHO;   Antimicrobials:  Unasyn Vancomycin  Subjective: Sleepy, would open eyes to voice, denies pain. Brother at bedside.  patient has not been able to ambulate since December 2020. She had covid, decubitus ulcer infection.  I introduce palliative to him for goals of care.   Objective: Vitals:   01/02/20 1008 01/02/20 1010 01/02/20 1034 01/02/20 1112  BP: (!) 117/58  (!) 92/58 110/70  Pulse: (!) 51 (!) 55 (!) 55 60  Resp:    16  Temp:  98 F (36.7 C)  TempSrc:      SpO2: 98% 99% 97% 94%  Weight:      Height:        Intake/Output Summary (Last 24 hours) at 01/02/2020 1215 Last data filed at 01/02/2020 7169 Gross per 24 hour  Intake 1200 ml  Output 350 ml  Net 850 ml   Filed Weights   12/31/19 0148 01/01/20 0150 01/02/20 0001  Weight: 68 kg 70.3 kg 70.3 kg    Examination:  General exam: NAD Respiratory system: CTA Cardiovascular system: S 1, S 2 RRR Gastrointestinal system: BS present, soft, nt Central nervous system: sleepy Extremities: no edema Skin: papular rash dorsum improved. L;eft arm improved. Right arm same    Data Reviewed: I have personally reviewed following labs and imaging  studies  CBC: Recent Labs  Lab 12/27/19 0427 12/29/19 0451 12/30/19 0423 01/01/20 0512  WBC 6.5 6.5 8.0 8.4  NEUTROABS 3.2 3.8 4.6  --   HGB 10.1* 10.5* 11.2* 10.8*  HCT 34.6* 35.3* 37.0 36.7  MCV 96.4 97.2 94.9 97.3  PLT 171 177 191 176   Basic Metabolic Panel: Recent Labs  Lab 12/28/19 0516 12/29/19 0451 12/30/19 0423 12/31/19 0817 01/01/20 0512  NA 140 142 141 139 142  K 3.9 3.6 4.0 3.6 4.0  CL 105 107 101 99 99  CO2 26 27 25  32 35*  GLUCOSE 165* 182* 177* 215* 161*  BUN 11 10 12 15 16   CREATININE 0.51 0.47 0.48 0.48 0.43*  CALCIUM 8.5* 8.6* 8.5* 8.6* 8.4*  MG  --   --  1.8 1.7  --    GFR: Estimated Creatinine Clearance: 61.1 mL/min (A) (by C-G formula based on SCr of 0.43 mg/dL (L)). Liver Function Tests: Recent Labs  Lab 12/29/19 0835  AST 22  ALT 19  ALKPHOS 76  BILITOT 0.4  PROT 6.0*  ALBUMIN 2.1*   No results for input(s): LIPASE, AMYLASE in the last 168 hours. Recent Labs  Lab 12/30/19 1019  AMMONIA 30   Coagulation Profile: No results for input(s): INR, PROTIME in the last 168 hours. Cardiac Enzymes: No results for input(s): CKTOTAL, CKMB, CKMBINDEX, TROPONINI in the last 168 hours. BNP (last 3 results) No results for input(s): PROBNP in the last 8760 hours. HbA1C: No results for input(s): HGBA1C in the last 72 hours. CBG: Recent Labs  Lab 01/01/20 1120 01/01/20 1644 01/01/20 2126 01/02/20 0610 01/02/20 1110  GLUCAP 257* 164* 215* 160* 239*   Lipid Profile: No results for input(s): CHOL, HDL, LDLCALC, TRIG, CHOLHDL, LDLDIRECT in the last 72 hours. Thyroid Function Tests: No results for input(s): TSH, T4TOTAL, FREET4, T3FREE, THYROIDAB in the last 72 hours. Anemia Panel: No results for input(s): VITAMINB12, FOLATE, FERRITIN, TIBC, IRON, RETICCTPCT in the last 72 hours. Sepsis Labs: No results for input(s): PROCALCITON, LATICACIDVEN in the last 168 hours.  No results found for this or any previous visit (from the past 240  hour(s)).       Radiology Studies: No results found.      Scheduled Meds: . apixaban  5 mg Oral BID  . atorvastatin  10 mg Oral Daily  . carvedilol  25 mg Oral BID WC  . divalproex  500 mg Oral BID  . furosemide  40 mg Oral Daily  . hydrocortisone cream   Topical BID  . insulin aspart  0-20 Units Subcutaneous TID WC  . insulin glargine  8 Units Subcutaneous Daily  . mouth rinse  15 mL Mouth Rinse BID  .  multivitamin with minerals  1 tablet Oral Daily  . pantoprazole  40 mg Oral BID  . Ensure Max Protein  11 oz Oral Daily  . senna-docusate  1 tablet Oral Daily   Continuous Infusions: . sodium chloride 250 mL (01/01/20 1739)  . sodium chloride 250 mL (01/02/20 1025)  . ampicillin-sulbactam (UNASYN) IV 3 g (01/02/20 1026)  . vancomycin 1,000 mg (01/02/20 0515)     LOS: 13 days    Time spent: 35 minutes    Tyrese Capriotti A Baelynn Schmuhl, MD [facility]   If 7PM-7AM, please contact night-coverage [email or web address]  01/02/2020, 12:15 PM

## 2020-01-02 NOTE — Progress Notes (Signed)
Patient refused medications this am.MD aware.

## 2020-01-02 NOTE — Consult Note (Signed)
Palliative Medicine Inpatient Consult Note  Reason for consult:  Goals of care  HPI:  Per intake H&P --> 74 year old with past medical history significant for A. fib on Eliquis, bipolar disorder, hypertension,  diabetes type 2 who presented after out of hospital cardiopulmonary arrest.  Patient was recently discharged on 12/17/2019 for stage IV pressure ulcer to be on Unasyn plus vancomycin until 6/10.  On 5/9, staff at the nursing home reported she didnt " look right" at lunch.  She was later found in her room with agonal  breathing without a pulse. In the ED, SPO2 100% intubated, blood pressure 120/91 on levophed. , blood glucose 300, lactate of 6.  White blood cell 11.  ABG seven-point 4/60/27/42.  Troponin 76 and 161 6 hours later.  EKG normal sinus rhythm.  Patient was minimally responsive no sedation.  She was a started on Levophed for blood pressure support and transition to meropenem and admitted to [facility] service.  Palliative care was asked to be involved to aid in goals of care conversations. Becky Hendricks has been hospitalized for twelve days in the setting of severe sepsis from her sacral wound. She endured a cardiopulmonary arrest at her nursing home.   Clinical Assessment/Goals of Care: I have reviewed medical records including EPIC notes, labs and imaging, received report from bedside RN, assessed the patient.    I met with Becky Hendricks to further discuss diagnosis prognosis, GOC, EOL wishes, disposition and options.   I introduced Palliative Medicine as specialized medical care for people living with serious illness. It focuses on providing relief from the symptoms and stress of a serious illness. The goal is to improve quality of life for both the patient and the family.  I asked Becky Hendricks to tell me about herself. She is from [place] originally though moved to [place] with her family and later in life to [place]. 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 She moved to [place], [place] to be closer to her brother. She has lived in [place] for many years now and was mostly autonomous in her [place]. She does not endorses feeling joy from anything and states that she is not religious.  I asked Becky Hendricks what she understood about her present illness. She stated that it is "a critical situation." I asked her what she meant by this and she started off for quite sometime unable to answer my question.   A detailed discussion was had today regarding advanced directives.  Concepts specific to code status, artifical feeding and hydration, continued IV antibiotics and rehospitalization was had.  The difference between a aggressive medical intervention path  and a palliative comfort care path for this patient at this time was had. Becky Hendricks said that she would want any and everything done to maintain and preserve her life. I shared with her my concerns regarding her present situation. She became quite suspicious of me during our conversation and refused to continue talking to me. She stated that I had an "alterior motive." I tried to reassure her that part of Palliative Care is identifying what is important to patient in terms of their wishes most especially in critical scenarios.   It appeared that Becky Hendricks was not quite clear on what were talking about as she kept staring off during our conversation and losing her train of thought. I asked her if it would be alright if I contacted her brother to discuss this topic in greater detail which she was  in agreement with.   Discussed the importance of continued conversation with family and their  medical providers regarding overall plan of care and treatment options, ensuring decisions are within the context of the patients values and GOCs. _________________________________________________________________________________________ Addendum: I called Becky Hendricks to share with him the discussion [name] and I had. Becky Hendricks states that she has  not been herself since December when she suffered a fall. From then on she has been in and out of the hospital recurrently with a variety of ailments. Becky Hendricks states that he is aware of her resuscitation efforts leading to this hospitalization. I asked him if he felt his sister was doing well. He said that he knows she is doing poorly. He emphasized her fluctuating mental state over the past few weeks. I asked Becky Hendricks what their conversations had been, if any in the past regarding advanced care planning. He said that there have been no formal document. A MOST was completed on 09/24/2019 which stated full code full scope of treatment. I asked if given the present situation Becky Hendricks thinks this is still in the best interest of his sister. He said, "you don't know until you try." He then discusses with me that he would not want her life prolonged if she were brain dead but would want a trial of all therapies. I emphasized to him that our goals on the Palliative service is to extract what patients find most important from a quality of life perspective. Becky Hendricks was not able to answer what quality of life would be acceptable to [place]. I asked him if he plans to come to visit Becky Hendricks anytime soon so that we may meet face to face. He plans to be here on Tuesday. We agreed to meet at 0900am.   Decision Maker: Becky Hendricks  SUMMARY OF RECOMMENDATIONS   Full code full scope for the time being  Patient brother plans to come Tuesday at 0900AM. We plan to meet then to further discuss goals of care.  Code Status/Advance Care Planning: FULL CODE   Palliative Prophylaxis:   Turn Q2H, Delirium prevention,   Additional Recommendations (Limitations, Scope, Preferences):  Full scope of treatment   Psycho-social/Spiritual:   Desire for further Chaplaincy support: No, patient does not have a religious belief  Additional Recommendations: Education on Palliative care in the setting of progressive illness   Prognosis:  Poor given that patient already suffered a cardiac arrest has decrease in functionality and ongoing OM.   Discharge Planning: Unable to determine at the present time.   PPS: 30%    This conversation/these recommendations were discussed with patient primary care team, Dr. Tyrell Antonio  Time In: 1140 Time Out: 1250 Total Time: 66 Greater than 50%  of this time was spent counseling and coordinating care related to the above assessment and plan.  [place] Team Team Cell Phone: [phone] Please utilize secure chat with additional questions, if there is no response within 30 minutes please call the above phone number  Palliative Medicine Team providers are available by phone from 7am to 7pm daily and can be reached through the team cell phone.  Should this patient require assistance outside of these hours, please call the patient's attending physician.

## 2020-01-02 NOTE — Plan of Care (Signed)
  Problem: Pain Managment: Goal: General experience of comfort will improve Outcome: Completed/Met

## 2020-01-03 DIAGNOSIS — L270 Generalized skin eruption due to drugs and medicaments taken internally: Secondary | ICD-10-CM

## 2020-01-03 DIAGNOSIS — M86159 Other acute osteomyelitis, unspecified femur: Secondary | ICD-10-CM

## 2020-01-03 LAB — CBC
HCT: 35.9 % — ABNORMAL LOW (ref 36.0–46.0)
Hemoglobin: 10.4 g/dL — ABNORMAL LOW (ref 12.0–15.0)
MCH: 28.7 pg (ref 26.0–34.0)
MCHC: 29 g/dL — ABNORMAL LOW (ref 30.0–36.0)
MCV: 98.9 fL (ref 80.0–100.0)
Platelets: 160 10*3/uL (ref 150–400)
RBC: 3.63 MIL/uL — ABNORMAL LOW (ref 3.87–5.11)
RDW: 18.6 % — ABNORMAL HIGH (ref 11.5–15.5)
WBC: 6.9 10*3/uL (ref 4.0–10.5)
nRBC: 0 % (ref 0.0–0.2)

## 2020-01-03 LAB — MRSA PCR SCREENING: MRSA by PCR: POSITIVE — AB

## 2020-01-03 LAB — BASIC METABOLIC PANEL
Anion gap: 10 (ref 5–15)
BUN: 8 mg/dL (ref 8–23)
CO2: 33 mmol/L — ABNORMAL HIGH (ref 22–32)
Calcium: 8.4 mg/dL — ABNORMAL LOW (ref 8.9–10.3)
Chloride: 99 mmol/L (ref 98–111)
Creatinine, Ser: 0.31 mg/dL — ABNORMAL LOW (ref 0.44–1.00)
GFR calc Af Amer: >60 mL/min (ref >60)
GFR calc non Af Amer: >60 mL/min (ref >60)
Glucose, Bld: 152 mg/dL — ABNORMAL HIGH (ref 70–99)
Potassium: 4 mmol/L (ref 3.5–5.1)
Sodium: 142 mmol/L (ref 135–145)

## 2020-01-03 LAB — GLUCOSE, CAPILLARY
Glucose-Capillary: 134 mg/dL — ABNORMAL HIGH (ref 70–99)
Glucose-Capillary: 157 mg/dL — ABNORMAL HIGH (ref 70–99)
Glucose-Capillary: 269 mg/dL — ABNORMAL HIGH (ref 70–99)
Glucose-Capillary: 154 mg/dL — ABNORMAL HIGH (ref 70–99)

## 2020-01-03 NOTE — Progress Notes (Signed)
   Palliative Medicine Inpatient Follow Up Note   HPI: Per intake H&P --> 74 year old with past medical history significant for A. fib on Eliquis, bipolar disorder, hypertension, diabetes type 2 who presented after out of hospital cardiopulmonary arrest. Patient was recently discharged on 12/17/2019 for stage IV pressure ulcer to be on Unasyn plus vancomycin until 6/10. On 5/9, staff at the nursing home reported she didnt " look right" at lunch. She was later found in her room with agonal breathing without a pulse. In the ED, SPO2 100% intubated, blood pressure 120/91 on levophed. , blood glucose 300, lactate of 6. White blood cell 11. ABG seven-point 4/60/27/42. Troponin 76 and 161 6 hours later. EKG normal sinus rhythm. Patient was minimally responsive no sedation. She was a started on Levophed for blood pressure support and transition to meropenem and admitted to [facility] service.  Palliative care was asked to be involved to aid in goals of care conversations. Becky Hendricks has been hospitalized for twelve days in the setting of severe sepsis from her sacral wound. She endured a cardiopulmonary arrest at her nursing home.   As of yesterday patient and her brother were not willing to consider changing code status. Patient got very angry and no longer wanted to broach this topic.  Today's Discussion (01/03/2020): Chart reviewed. I met with Becky Hendricks at bedside she was calm,  she did not recalled our conversation from yesterday. Per nursing there are no significant issues that would warrant additional Palliative concern.   Plan to meet with patient brother, [name] Tuesday at 0900.  Discussed the importance of continued conversation with family and their  medical providers regarding overall plan of care and treatment options, ensuring decisions are within the context of the patients values and GOCs.  Vital Signs Vitals:   01/03/20 0429 01/03/20 0651  BP: (!) 131/58 (!) 117/55  Pulse: 60 61  Resp: 19  (!) 22  Temp: 98.4 F (36.9 C)   SpO2: 100%     Intake/Output Summary (Last 24 hours) at 01/03/2020 1029 Last data filed at 01/03/2020 6568 Gross per 24 hour  Intake 1384.09 ml  Output 950 ml  Net 434.09 ml   Last Weight  Most recent update: 01/03/2020 12:58 AM   Weight  74.8 kg (165 lb)           SUMMARY OF RECOMMENDATIONS Full code full scope for the time being  Plan to meet with [name] at bedside Tuesday at 0900  Time Spent: 15 Greater than 50% of the time was spent in counseling and coordination of care ______________________________________________________________________________________ [place] Team Team Cell Phone: [phone] Please utilize secure chat with additional questions, if there is no response within 30 minutes please call the above phone number  Palliative Medicine Team providers are available by phone from 7am to 7pm daily and can be reached through the team cell phone.  Should this patient require assistance outside of these hours, please call the patient's attending physician.

## 2020-01-03 NOTE — Progress Notes (Signed)
PROGRESS NOTE    Becky Hendricks  FBP:[id number] DOB: 12-Jun-1946 DOA: 12/20/2019 PCP: [name], MD   Brief Narrative: 74 year old with past medical history significant for A. fib on Eliquis, bipolar disorder, hypertension,  diabetes type 2 who presented after out of hospital cardiopulmonary arrest.  Patient was recently discharged on 12/17/2019 for stage IV pressure ulcer to be on Unasyn plus vancomycin until 6/10.  On 5/9, staff at the nursing home reported she didnt " look right" at lunch.  She was later found in her room with agonal  breathing without a pulse.  In the ED, SPO2 100% intubated, blood pressure 120/91 on levophed. , blood glucose 300, lactate of 6.  White blood cell 11.  ABG seven-point 4/60/27/42.  Troponin 76 and 161 6 hours later.  EKG normal sinus rhythm.  Patient was minimally responsive no sedation.  She was a started on Levophed for blood pressure support and transition to meropenem and admitted to [facility] service.  Subsequently weaned off of pressors.  Patient extubated.  Patient continued to be on IV antibiotics.  Patient transferred to the hospitalist service on 12/23/2019. Patient developed a rash, ID think it could be related to acetazolamide.  ID recommend to continue with current antibiotics. Patient developed tachycardia overnight, PA-C.  Cardiology has been consulted due to recent cardiac arrest. Afib RVR overnight (01/01/2020) received IV metoprolol times 2. Coreg increase to 25 mg BID>   Assessment & Plan:   Active Problems:   Pressure injury of skin   Respiratory failure with hypoxia (HCC)   Acute metabolic encephalopathy   Severe sepsis (HCC)   Osteomyelitis (HCC)   Abscess of multiple sites of buttock   AF (paroxysmal atrial fibrillation) (HCC)   Cardiac arrest (HCC)   Hyperphosphatemia   Edema of upper extremity   Palliative care by specialist   Goals of care, counseling/discussion   Drug rash   Acute osteomyelitis of pelvic region and thigh [facility])   1-Cardiopulmonary arrest: Patient noted to have cardiac/pulmonary arrest on 12/20/2019 the nursing home require 1 or 2 minutes of CPR before ROSC.  -This was thought to be related to septic shock secondary to a stage IV ulcer with osteomyelitis -Patient required IV pressors initially, she has been weaned off -2D echo ejection fraction 60 to 65% -Mild elevated troponin -CT angiogram negative for PE -Developed tachycardia and PACs overnight.  Cardiology has been consulted. Recommend to resume coreg. Cardiology aware of SVT event last night. Recommended to increase coreg as needed/    2-Severe sepsis with septic shock secondary to osteomyelitis of the sacrum coccygeal stage IV pressure ulcer, POA -Secondary to osteomyelitis of the sacrum and cocci. -Patient noted to have polymicrobial decubitus ulcer with a strep viridans bacteremia noted last admission. 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 -Continue with IV antibiotics; Unasyn and vancomycin. Monitor for worsening of rash.   3-Acute Metabolic encephalopathy: Secondary to patient acute pulmonary arrest. Head negative for acute abnormalities EEG showed moderate to severe diffuse encephalopathy Patient was also noted to have encephalopathy during prior hospitalizations TSH, B12, normal, RPR non-reactive MRI of the head negative for acute stroke Mental status fluctuates.   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 Continue with IV antibiotics. When rash improved with proceed with central line to be able to be discharge to SNF Might be able to use left arm  for PICC line soon,  rash improving.   5-Paroxysmal A. fib: Continue with Eliquis HR up to 140, received  IV metoprolol. Increase coreg.  HR better controlled  6-Bipolar:  Continue with Depakote 7-DM type II: continue with Lantus and sliding scale 8-Hyperphosphatemia: Continue with calcium binders.  9-Maculopapular rash: ID reconsulted, they think it could be related to acetazolamide Continue with current antibiotics. Received IV Benadryl, Pepcid and prednisone Rash improved.  Topical steroid   10-Bilateral pleural Effusion, LE edema;  Treated  with IV Lasix Chest x-ray 5/18: Show persistent right pleural effusion. Negative 2.7 L.  Change lasix to oral.   11-abdominal pain; KUB Non Obstructive bowel gas pattern mild feccal retention through the colon. Fleet enema ordered. Had multiples BM./   12-SVT; replete mg. On coreg.   Pressure Injury 12/20/19 Buttocks Right Stage 4 - Full thickness tissue loss with exposed bone, tendon or muscle. (Active)  12/20/19 2030  Location: Buttocks  Location Orientation: Right  Staging: Stage 4 - Full thickness tissue loss with exposed bone, tendon or muscle.  Wound Description (Comments):   Present on Admission: Yes     Pressure Injury 12/20/19 Buttocks Left;Proximal Stage 4 - Full thickness tissue loss with exposed bone, tendon or muscle. (Active)  12/20/19 2030  Location: Buttocks  Location Orientation: Left;Proximal  Staging: Stage 4 - Full thickness tissue loss with exposed bone, tendon or muscle.  Wound Description (Comments):   Present on Admission: Yes     Pressure Injury 12/20/19 Buttocks Left;Distal Stage 4 - Full thickness tissue loss with exposed bone, tendon or muscle. (Active)  12/20/19 2030  Location: Buttocks  Location Orientation: Left;Distal  Staging: Stage 4 - Full thickness tissue loss with exposed bone, tendon or muscle.  Wound Description (Comments):   Present on Admission: Yes     Nutrition Problem: Increased  nutrient needs Etiology: wound healing    Signs/Symptoms: estimated needs    Interventions: (P) MVI, Premier Protein, Magic cup  Estimated body mass index is 28.32 kg/m as calculated from the following:   Height as of this encounter: 5\' 4"  (1.626 m).   Weight as of this encounter: 74.8 kg.   DVT prophylaxis: Eliquis Code Status: Full Code Family Communication: Brother at bedside.  Disposition Plan:  Status is: Inpatient  Remains inpatient appropriate because; patient is still requiring IV Lasix this for pleural effusion and volume overload.  Continue to monitor rash.  Cardiology evaluation   Dispo: The patient is from: Skilled nursing facility              Anticipated d/c is to: Nursing facility              Anticipated d/c date is: 2 or 3 days, when able to place PICC line, central line chest when rash improved.               Patient currently ; not medically stable         Consultants:   ID  Cardiology   Procedures:   ECHO;   Antimicrobials:  Unasyn Vancomycin  Subjective: She is alert today, denies pain. She was fed breakfast today, but she didn't remember it and was asking for  breakfast    Objective: Vitals:   01/02/20 2016 01/03/20 0056 01/03/20 0429 01/03/20 0651  BP:  136/67  (!) 131/58 (!) 117/55  Pulse: 63  60 61  Resp: 19  19 (!) 22  Temp: 98.8 F (37.1 C)  98.4 F (36.9 C)   TempSrc: Oral  Oral   SpO2: 97%  100%   Weight:  74.8 kg    Height:        Intake/Output Summary (Last 24 hours) at 01/03/2020 1128 Last data filed at 01/03/2020 0847 Gross per 24 hour  Intake 1384.09 ml  Output 950 ml  Net 434.09 ml   Filed Weights   01/01/20 0150 01/02/20 0001 01/03/20 0056  Weight: 70.3 kg 70.3 kg 74.8 kg    Examination:  General exam: NAD Respiratory system: CTA Cardiovascular system: S 1, S 2 RRR Gastrointestinal system: BS present, soft, nt Central nervous system: Alert, confuse Extremities: no edema Skin: papular rash  dorsum improved. L;eft arm improved. Right arm same    Data Reviewed: I have personally reviewed following labs and imaging studies  CBC: Recent Labs  Lab 12/29/19 0451 12/30/19 0423 01/01/20 0512 01/03/20 0735  WBC 6.5 8.0 8.4 6.9  NEUTROABS 3.8 4.6  --   --   HGB 10.5* 11.2* 10.8* 10.4*  HCT 35.3* 37.0 36.7 35.9*  MCV 97.2 94.9 97.3 98.9  PLT 177 191 176 160   Basic Metabolic Panel: Recent Labs  Lab 12/30/19 0423 12/31/19 0817 01/01/20 0512 01/02/20 1236 01/03/20 0735  NA 141 139 142 141 142  K 4.0 3.6 4.0 4.4 4.0  CL 101 99 99 97* 99  CO2 25 32 35* 35* 33*  GLUCOSE 177* 215* 161* 252* 152*  BUN 12 15 16 13 8   CREATININE 0.48 0.48 0.43* 0.41* 0.31*  CALCIUM 8.5* 8.6* 8.4* 8.2* 8.4*  MG 1.8 1.7  --   --   --    GFR: Estimated Creatinine Clearance: 62.9 mL/min (A) (by C-G formula based on SCr of 0.31 mg/dL (L)). Liver Function Tests: Recent Labs  Lab 12/29/19 0835  AST 22  ALT 19  ALKPHOS 76  BILITOT 0.4  PROT 6.0*  ALBUMIN 2.1*   No results for input(s): LIPASE, AMYLASE in the last 168 hours. Recent Labs  Lab 12/30/19 1019  AMMONIA 30   Coagulation Profile: No results for input(s): INR, PROTIME in the last 168 hours. Cardiac Enzymes: No results for input(s): CKTOTAL, CKMB, CKMBINDEX, TROPONINI in the last 168 hours. BNP (last 3 results) No results for input(s): PROBNP in the last 8760 hours. HbA1C: No results for input(s): HGBA1C in the last 72 hours. CBG: Recent Labs  Lab 01/02/20 1110 01/02/20 1629 01/02/20 2142 01/03/20 0611 01/03/20 1105  GLUCAP 239* 235* 212* 134* 157*   Lipid Profile: No results for input(s): CHOL, HDL, LDLCALC, TRIG, CHOLHDL, LDLDIRECT in the last 72 hours. Thyroid Function Tests: No results for input(s): TSH, T4TOTAL, FREET4, T3FREE, THYROIDAB in the last 72 hours. Anemia Panel: No results for input(s): VITAMINB12, FOLATE, FERRITIN, TIBC, IRON, RETICCTPCT in the last 72 hours. Sepsis Labs: No results for  input(s): PROCALCITON, LATICACIDVEN in the last 168 hours.  Recent Results (from the past 240 hour(s))  MRSA PCR Screening     Status: Abnormal   Collection Time: 01/03/20  7:05 AM   Specimen: Nasal Mucosa; Nasopharyngeal  Result Value Ref Range Status   MRSA by PCR POSITIVE (A) NEGATIVE Final    Comment:        The GeneXpert MRSA Assay (FDA approved for NASAL specimens only), is one component of a comprehensive MRSA colonization surveillance program. It  is not intended to diagnose MRSA infection nor to guide or monitor treatment for MRSA infections. RESULT CALLED TO, READ BACK BY AND VERIFIED WITH: RN [facility] [place] 01/03/20 0924 KB          Radiology Studies: No results found.      Scheduled Meds: . apixaban  5 mg Oral BID  . atorvastatin  10 mg Oral Daily  . carvedilol  25 mg Oral BID WC  . divalproex  500 mg Oral BID  . furosemide  40 mg Oral Daily  . hydrocortisone cream   Topical BID  . insulin aspart  0-20 Units Subcutaneous TID WC  . insulin glargine  8 Units Subcutaneous Daily  . mouth rinse  15 mL Mouth Rinse BID  . multivitamin with minerals  1 tablet Oral Daily  . pantoprazole  40 mg Oral BID  . Ensure Max Protein  11 oz Oral Daily  . senna-docusate  1 tablet Oral Daily   Continuous Infusions: . sodium chloride 250 mL (01/01/20 1739)  . sodium chloride 250 mL (01/02/20 1025)  . ampicillin-sulbactam (UNASYN) IV 3 g (01/03/20 1011)  . vancomycin 1,000 mg (01/03/20 0644)     LOS: 14 days    Time spent: 35 minutes    Belkys A Regalado, MD [facility]   If 7PM-7AM, please contact night-coverage [email or web address]  01/03/2020, 11:28 AM

## 2020-01-04 ENCOUNTER — Inpatient Hospital Stay: Payer: Self-pay

## 2020-01-04 LAB — GLUCOSE, CAPILLARY
Glucose-Capillary: 140 mg/dL — ABNORMAL HIGH (ref 70–99)
Glucose-Capillary: 207 mg/dL — ABNORMAL HIGH (ref 70–99)
Glucose-Capillary: 179 mg/dL — ABNORMAL HIGH (ref 70–99)
Glucose-Capillary: 228 mg/dL — ABNORMAL HIGH (ref 70–99)

## 2020-01-04 LAB — SARS CORONAVIRUS 2 (TAT 6-24 HRS): SARS Coronavirus 2: NEGATIVE

## 2020-01-04 LAB — VANCOMYCIN, TROUGH: Vancomycin Tr: 21 ug/mL (ref 15–20)

## 2020-01-04 MED ORDER — SODIUM CHLORIDE 0.9% FLUSH
10.0000 mL | INTRAVENOUS | Status: DC | PRN
  Administered 2020-01-06: 10 mL

## 2020-01-04 MED ORDER — MUPIROCIN 2 % EX OINT
TOPICAL_OINTMENT | Freq: Two times a day (BID) | CUTANEOUS | Status: DC
  Filled 2020-01-04 (×2): qty 22

## 2020-01-04 MED ORDER — SODIUM CHLORIDE 0.9% FLUSH
10.0000 mL | Freq: Two times a day (BID) | INTRAVENOUS | Status: DC
  Administered 2020-01-04 – 2020-01-05 (×4): 10 mL

## 2020-01-04 NOTE — Progress Notes (Signed)
Occupational Therapy Treatment Patient Details Name: Becky Hendricks MRN: [id number] DOB: 1946-02-20 Today's Date: 01/04/2020    History of present illness 74 Y/o with PMH of afib, bipolar, HTN, Obesity, DM, Pressure ulcer with recent admission for left buttock abscess s.p drainage brought to ED after arrest requiring brief CPR at nursing home.  pt was intubated in the field.  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  Pt extubated 5/11.   OT comments  Pt progressing to OOB activity. Pt getting OOB with maximove, rolling side to side with maxA and totalA+2 with lift to recliner.  Pt requiring guidance of BUE/trunk/BLEs side to side and for LUE to reach for side rail. Pt attempting grooming in recliner; pt able to grasp drink with LUE to bring to mouth. Pt requires encouragement to use LUE for self feeding/drinking and initiated HEP for BUEs. Pt tolerating session well in recliner. Pt appeared motivated to get OOB. Pt would greatly benefit from continued OT skilled services. OT following acutely. O2 >90% on 2L and HR <115 BPM with exertion.     Follow Up Recommendations  SNF;Supervision/Assistance - 24 hour    Equipment Recommendations  Other (comment)(defer)    Recommendations for Other Services      Precautions / Restrictions Precautions Precautions: Fall Precaution Comments: sacral wound, stage IV with osteo Restrictions Weight Bearing Restrictions: No       Mobility Bed Mobility Overal bed mobility: Needs Assistance Bed Mobility: Rolling Rolling: Max assist         General bed mobility comments: Pt requiring guidance of BUE/trunk/BLEs side to side and for LUE to reach for side rail.  Transfers Overall transfer level: Needs assistance Equipment used: Ambulation equipment used             General transfer comment: transferred via lift to recliner    Balance Overall balance  assessment: Needs assistance Sitting-balance support: Feet supported Sitting balance-Leahy Scale: Poor Sitting balance - Comments: Pt requiring intermittent assist for sitting upright, but pt prefers to be left in supportive sitting for any functional task.                                   ADL either performed or assessed with clinical judgement   ADL Overall ADL's : Needs assistance/impaired Eating/Feeding: Minimal assistance;Sitting Eating/Feeding Details (indicate cue type and reason): Will continue to assess for AE needs. Grooming: Maximal assistance Grooming Details (indicate cue type and reason): pt showing no interest in grooming with LUE as RUE edematous.                             Functional mobility during ADLs: Total assistance;+2 for physical assistance General ADL Comments: Pt getting OOB with maximove, rolling side to side with maxA and totalA+2 with lift to recliner. Pt attempting grooming in recliner; pt able to grasp drink with LUE to bring to mouth.     Vision   Vision Assessment?: No apparent visual deficits   Perception     Praxis      Cognition Arousal/Alertness: Awake/alert Behavior During Therapy: Flat affect;Anxious Overall Cognitive Status: No family/caregiver present to determine baseline cognitive functioning Area of Impairment: Awareness;Safety/judgement;Problem solving  Safety/Judgement: Decreased awareness of safety;Decreased awareness of deficits Awareness: Intellectual Problem Solving: Slow processing;Requires verbal cues;Requires tactile cues General Comments: Pt kept stating over and over again "Let's just keep moving." Pt's responses were not always applicable. Pt appeared to not like to choose between choices. Concise and "let's do this"  would work better.        Exercises Other Exercises Other Exercises: AROM to LUE, WFLs; AAROM to RUE wrist and hand; pt not tolerating elbow  ROM   Shoulder Instructions       General Comments O2 >90% on 2LO2; HR elevated to 115 BPM with activity.    Pertinent Vitals/ Pain       Pain Assessment: Faces Faces Pain Scale: Hurts little more Pain Location: wound and joints Pain Descriptors / Indicators: Grimacing Pain Intervention(s): Monitored during session;Limited activity within patient's tolerance  Home Living                                          Prior Functioning/Environment              Frequency  Min 2X/week        Progress Toward Goals  OT Goals(current goals can now be found in the care plan section)  Progress towards OT goals: Progressing toward goals  Acute Rehab OT Goals Patient Stated Goal: to get out of bed OT Goal Formulation: With patient Time For Goal Achievement: 01/18/20 Potential to Achieve Goals: Fair ADL Goals Pt Will Perform Eating: with supervision;with set-up;sitting;with adaptive utensils Pt Will Perform Grooming: with min assist;sitting Pt Will Perform Upper Body Bathing: with min assist;sitting Additional ADL Goal #1: Pt will demonstrate emergent awareness during ADL task in nondistracting environment Additional ADL Goal #2: Pt will increase to modA for bed mobility to edge of bed as precursor for ADL/mobility tasks.  Plan Discharge plan remains appropriate    Co-evaluation                 AM-PAC OT "6 Clicks" Daily Activity     Outcome Measure   Help from another person eating meals?: A Lot Help from another person taking care of personal grooming?: A Lot Help from another person toileting, which includes using toliet, bedpan, or urinal?: Total Help from another person bathing (including washing, rinsing, drying)?: A Lot Help from another person to put on and taking off regular upper body clothing?: Total Help from another person to put on and taking off regular lower body clothing?: Total 6 Click Score: 9    End of Session Equipment  Utilized During Treatment: Oxygen  OT Visit Diagnosis: Other abnormalities of gait and mobility (R26.89);Muscle weakness (generalized) (M62.81);Other symptoms and signs involving cognitive function;Pain Pain - Right/Left: Right Pain - part of body: Arm(bottom, RUE)   Activity Tolerance Patient tolerated treatment well   Patient Left in chair;with call bell/phone within reach;with chair alarm set   Nurse Communication Mobility status;Need for lift equipment        Time: 1338-1400 OT Time Calculation (min): 22 min  Charges: OT General Charges $OT Visit: 1 Visit OT Treatments $Therapeutic Activity: 8-22 mins  Flora Lipps, OTR/L Acute Rehabilitation Services Pager: [phone] Office: [phone]    Becky Hendricks 01/04/2020, 3:37 PM

## 2020-01-04 NOTE — Progress Notes (Signed)
Peripherally Inserted Central Catheter Placement  The IV Nurse has discussed with the patient and/or persons authorized to consent for the patient, the purpose of this procedure and the potential benefits and risks involved with this procedure.  The benefits include less needle sticks, lab draws from the catheter, and the patient may be discharged home with the catheter. Risks include, but not limited to, infection, bleeding, blood clot (thrombus formation), and puncture of an artery; nerve damage and irregular heartbeat and possibility to perform a PICC exchange if needed/ordered by physician.  Alternatives to this procedure were also discussed.  Bard Power PICC patient education guide, fact sheet on infection prevention and patient information card has been provided to patient /or left at bedside.    PICC Placement Documentation  PICC Single Lumen 01/04/20 PICC Left Basilic 44 cm 0 cm (Active)  Indication for Insertion or Continuance of Line Prolonged intravenous therapies 01/04/20 1326  Exposed Catheter (cm) 0 cm 01/04/20 1326  Site Assessment Clean;Dry;Intact 01/04/20 1326  Line Status Flushed;Blood return noted 01/04/20 1326  Dressing Type Transparent 01/04/20 1326  Dressing Status Clean;Dry;Intact;Antimicrobial disc in place;Other (Comment) 01/04/20 1326  Dressing Intervention New dressing 01/04/20 1326  Dressing Change Due 01/11/20 01/04/20 1326    Telephone consent signed by brother   Maximino Greenland 01/04/2020, 1:27 PM

## 2020-01-04 NOTE — Progress Notes (Signed)
Pharmacy Antibiotic Note  Becky Hendricks is a 74 y.o. female admitted on 12/20/2019 with AMS and brief cardiac arrest.  Pharmacy has been consulted for vancomycin and ampicllin/sulbactam for osteomyelitis/bascess of the coccyx.  CT this admission showed decrease in size of left buttock fistula and abscess.   Patient currently afebrile, WBC continues to be wnl, Scr stable ~ 0.3-0.4  Previous vancomycin trough have been at upper end of goal, trough tonight was drawn slightly early and resulted at 21 but true trough closer to 19. Will continue current dose.   Plan: Continue Vancomycin 1g IV Q12H Unasyn 3g IV Q6h Monitor clinical progress, c/s, renal function F/u vancomycin levels weekly and as indicated Abx end date 01/21/20   Height: 5\' 4"  (162.6 cm) Weight: 98.4 kg (217 lb) IBW/kg (Calculated) : 54.7  Temp (24hrs), Avg:98.2 F (36.8 C), Min:98.1 F (36.7 C), Max:98.3 F (36.8 C)  Recent Labs  Lab 12/29/19 0451 12/29/19 0451 12/30/19 0423 12/31/19 0817 01/01/20 0512 01/02/20 1236 01/03/20 0735 01/04/20 1824  WBC 6.5  --  8.0  --  8.4  --  6.9  --   CREATININE 0.47   < > 0.48 0.48 0.43* 0.41* 0.31*  --   VANCOTROUGH  --   --   --   --   --   --   --  21*   < > = values in this interval not displayed.    Estimated Creatinine Clearance: 72.5 mL/min (A) (by C-G formula based on SCr of 0.31 mg/dL (L)).    Antimicrobials: Vanc 4/22 >>(6/10) Unasyn 4/28 >>(6/10) Merrem 5/9 >> 5/11  Microbiology: 5/9 covid / flu - negative 5/9 UCx - neg 5/9  BCx - neg  Data from previous admit: 4/25 VT = 15 >> continue 5/2 VT 8 - adjust to 1g q12 5/6 corrected VT~16 - cont current dose 5/16 corrected VT 18 - cont current dose 5/24 CT 21 corrected VT ~19 - continue current dose  4/22 BC: 1/3 viridan strep 4/22 UCx >>neg 4/23 L-buttock abscess: rare Staphylococcus warneri, Strep anginosis 4/23 MRSA PCR - positive  5/23 PharmD., BCPS Clinical Pharmacist 01/04/2020 7:29  PM

## 2020-01-04 NOTE — Progress Notes (Signed)
PROGRESS NOTE    Becky Hendricks  WER:[id number] DOB: [birth year] DOA: 12/20/2019 PCP: [name], MD   Brief Narrative: 74 year old with past medical history significant for A. fib on Eliquis, bipolar disorder, hypertension,  diabetes type 2 who presented after out of hospital cardiopulmonary arrest.  Patient was recently discharged on 12/17/2019 for stage IV pressure ulcer to be on Unasyn plus vancomycin until 6/10.  On 5/9, staff at the nursing home reported she didnt " look right" at lunch.  She was later found in her room with agonal  breathing without a pulse.  In the ED, SPO2 100% intubated, blood pressure 120/91 on levophed. , blood glucose 300, lactate of 6.  White blood cell 11.  ABG seven-point 4/60/27/42.  Troponin 76 and 161 6 hours later.  EKG normal sinus rhythm.  Patient was minimally responsive no sedation.  She was a started on Levophed for blood pressure support and transition to meropenem and admitted to [facility] service.  Subsequently weaned off of pressors.  Patient extubated.  Patient continued to be on IV antibiotics.  Patient transferred to the hospitalist service on 12/23/2019. Patient developed a rash, ID think it could be related to acetazolamide.  ID recommend to continue with current antibiotics. Patient developed tachycardia overnight, PA-C.  Cardiology has been consulted due to recent cardiac arrest. Afib RVR overnight (01/01/2020) received IV metoprolol times 2. Coreg increase to 25 mg BID>   Assessment & Plan:   Active Problems:   Pressure injury of skin   Respiratory failure with hypoxia (HCC)   Acute metabolic encephalopathy   Severe sepsis (HCC)   Osteomyelitis (HCC)   Abscess of multiple sites of buttock   AF (paroxysmal atrial fibrillation) (HCC)   Cardiac arrest (HCC)   Hyperphosphatemia   Edema of upper extremity   Palliative care by specialist   Goals of care, counseling/discussion   Drug rash   Acute osteomyelitis of pelvic region and thigh  [facility])  1-Cardiopulmonary arrest: Patient noted to have cardiac/pulmonary arrest on 12/20/2019 the nursing home require 1 or 2 minutes of CPR before ROSC.  -This was thought to be related to septic shock secondary to a stage IV ulcer with osteomyelitis -Patient required IV pressors initially, she has been weaned off -2D echo ejection fraction 60 to 65% -Mild elevated troponin -CT angiogram negative for PE -Developed tachycardia and PACs overnight.  Cardiology has been consulted. Recommend to resume coreg. Cardiology aware of SVT event last night. Recommended to increase coreg as needed/    2-Severe sepsis with septic shock secondary to osteomyelitis of the sacrum coccygeal stage IV pressure ulcer, POA -Secondary to osteomyelitis of the sacrum and cocci. -Patient noted to have polymicrobial decubitus ulcer with a strep viridans bacteremia noted last admission. 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 -Continue with IV antibiotics; Unasyn and vancomycin. Monitor for worsening of rash.   3-Acute Metabolic encephalopathy: Secondary to patient acute pulmonary arrest. Head negative for acute abnormalities EEG showed moderate to severe diffuse encephalopathy Patient was also noted to have encephalopathy during prior hospitalizations TSH, B12, normal, RPR non-reactive MRI of the head negative for acute stroke Mental status fluctuates.   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 Continue with IV antibiotics. When rash improved with proceed with central line to be able to be discharge to SNF Might be able to use  left arm for PICC line soon,  rash improved   5-Paroxysmal A. fib: Continue with Eliquis HR up to 140, received  IV metoprolol. Increase coreg.  HR better controlled  6-Bipolar:  Continue with Depakote 7-DM type II: continue with Lantus and sliding scale 8-Hyperphosphatemia: Continue with calcium binders.  9-Maculopapular rash: ID reconsulted, they think it could be related to acetazolamide Continue with current antibiotics. Received IV Benadryl, Pepcid and prednisone Rash improved.  Topical steroid   10-Bilateral pleural Effusion, LE edema;  Treated  with IV Lasix Chest x-ray 5/18: Show persistent right pleural effusion. Negative 2.7 L.  Change lasix to oral.   11-abdominal pain; KUB Non Obstructive bowel gas pattern mild feccal retention through the colon. Fleet enema ordered. Had multiples BM./   12-SVT; replete mg. On coreg.   Pressure Injury 12/20/19 Buttocks Right Stage 4 - Full thickness tissue loss with exposed bone, tendon or muscle. (Active)  12/20/19 2030  Location: Buttocks  Location Orientation: Right  Staging: Stage 4 - Full thickness tissue loss with exposed bone, tendon or muscle.  Wound Description (Comments):   Present on Admission: Yes     Pressure Injury 12/20/19 Buttocks Left;Proximal Stage 4 - Full thickness tissue loss with exposed bone, tendon or muscle. (Active)  12/20/19 2030  Location: Buttocks  Location Orientation: Left;Proximal  Staging: Stage 4 - Full thickness tissue loss with exposed bone, tendon or muscle.  Wound Description (Comments):   Present on Admission: Yes     Pressure Injury 12/20/19 Buttocks Left;Distal Stage 4 - Full thickness tissue loss with exposed bone, tendon or muscle. (Active)  12/20/19 2030  Location: Buttocks  Location Orientation: Left;Distal  Staging: Stage 4 - Full thickness tissue loss with exposed bone, tendon or muscle.  Wound Description (Comments):   Present on Admission: Yes     Nutrition Problem:  Increased nutrient needs Etiology: wound healing    Signs/Symptoms: estimated needs    Interventions: (P) MVI, Premier Protein, Magic cup  Estimated body mass index is 37.25 kg/m as calculated from the following:   Height as of this encounter: 5\' 4"  (1.626 m).   Weight as of this encounter: 98.4 kg.   DVT prophylaxis: Eliquis Code Status: Full Code Family Communication: Brother at bedside.  Disposition Plan:  Status is: Inpatient  Remains inpatient appropriate because; patient is still requiring IV Lasix this for pleural effusion and volume overload.  Continue to monitor rash.  Cardiology evaluation   Dispo: The patient is from: Skilled nursing facility              Anticipated d/c is to: Nursing facility              Anticipated d/c date is: 5/25, if she is able to get PICC line              Patient currently ; not medically stable         Consultants:   ID  Cardiology   Procedures:   ECHO;   Antimicrobials:  Unasyn Vancomycin  Subjective: Alert, report cramping abdominal pain.  confuse   Objective: Vitals:   01/03/20 2017 01/04/20 0211 01/04/20 0312 01/04/20 1126  BP: 107/76  119/62 122/64  Pulse: 65  64 67  Resp: 20  19 17   Temp: 98.3 F (36.8 C)  98.3  F (36.8 C) 98.1 F (36.7 C)  TempSrc: Oral  Oral Oral  SpO2:   93% 100%  Weight:  98.4 kg    Height:        Intake/Output Summary (Last 24 hours) at 01/04/2020 1232 Last data filed at 01/04/2020 1219 Gross per 24 hour  Intake 1540 ml  Output 925 ml  Net 615 ml   Filed Weights   01/02/20 0001 01/03/20 0056 01/04/20 0211  Weight: 70.3 kg 74.8 kg 98.4 kg    Examination:  General exam: NAD Respiratory system: CTA Cardiovascular system: S 1, S 2 RRR Gastrointestinal system: BS present, soft, nt Central nervous system: Alert, confuse Extremities: no edema Skin: papular rash dorsum improved. L;eft arm no significant rash. Right arm same    Data Reviewed: I have personally  reviewed following labs and imaging studies  CBC: Recent Labs  Lab 12/29/19 0451 12/30/19 0423 01/01/20 0512 01/03/20 0735  WBC 6.5 8.0 8.4 6.9  NEUTROABS 3.8 4.6  --   --   HGB 10.5* 11.2* 10.8* 10.4*  HCT 35.3* 37.0 36.7 35.9*  MCV 97.2 94.9 97.3 98.9  PLT 177 191 176 160   Basic Metabolic Panel: Recent Labs  Lab 12/30/19 0423 12/31/19 0817 01/01/20 0512 01/02/20 1236 01/03/20 0735  NA 141 139 142 141 142  K 4.0 3.6 4.0 4.4 4.0  CL 101 99 99 97* 99  CO2 25 32 35* 35* 33*  GLUCOSE 177* 215* 161* 252* 152*  BUN 12 15 16 13 8   CREATININE 0.48 0.48 0.43* 0.41* 0.31*  CALCIUM 8.5* 8.6* 8.4* 8.2* 8.4*  MG 1.8 1.7  --   --   --    GFR: Estimated Creatinine Clearance: 72.5 mL/min (A) (by C-G formula based on SCr of 0.31 mg/dL (L)). Liver Function Tests: Recent Labs  Lab 12/29/19 0835  AST 22  ALT 19  ALKPHOS 76  BILITOT 0.4  PROT 6.0*  ALBUMIN 2.1*   No results for input(s): LIPASE, AMYLASE in the last 168 hours. Recent Labs  Lab 12/30/19 1019  AMMONIA 30   Coagulation Profile: No results for input(s): INR, PROTIME in the last 168 hours. Cardiac Enzymes: No results for input(s): CKTOTAL, CKMB, CKMBINDEX, TROPONINI in the last 168 hours. BNP (last 3 results) No results for input(s): PROBNP in the last 8760 hours. HbA1C: No results for input(s): HGBA1C in the last 72 hours. CBG: Recent Labs  Lab 01/03/20 1105 01/03/20 1630 01/03/20 2045 01/04/20 0631 01/04/20 1106  GLUCAP 157* 269* 154* 140* 207*   Lipid Profile: No results for input(s): CHOL, HDL, LDLCALC, TRIG, CHOLHDL, LDLDIRECT in the last 72 hours. Thyroid Function Tests: No results for input(s): TSH, T4TOTAL, FREET4, T3FREE, THYROIDAB in the last 72 hours. Anemia Panel: No results for input(s): VITAMINB12, FOLATE, FERRITIN, TIBC, IRON, RETICCTPCT in the last 72 hours. Sepsis Labs: No results for input(s): PROCALCITON, LATICACIDVEN in the last 168 hours.  Recent Results (from the past 240  hour(s))  MRSA PCR Screening     Status: Abnormal   Collection Time: 01/03/20  7:05 AM   Specimen: Nasal Mucosa; Nasopharyngeal  Result Value Ref Range Status   MRSA by PCR POSITIVE (A) NEGATIVE Final    Comment:        The GeneXpert MRSA Assay (FDA approved for NASAL specimens only), is one component of a comprehensive MRSA colonization surveillance program. It is not intended to diagnose MRSA infection nor to guide or monitor treatment for MRSA infections. RESULT CALLED TO, READ BACK BY  AND VERIFIED WITH: RN [facility] [place] 01/03/20 0924 KB          Radiology Studies: [place] EKG SITE RITE  Result Date: 01/04/2020 If Site Rite image not attached, placement could not be confirmed due to current cardiac rhythm.       Scheduled Meds: . apixaban  5 mg Oral BID  . atorvastatin  10 mg Oral Daily  . carvedilol  25 mg Oral BID WC  . divalproex  500 mg Oral BID  . furosemide  40 mg Oral Daily  . hydrocortisone cream   Topical BID  . insulin aspart  0-20 Units Subcutaneous TID WC  . insulin glargine  8 Units Subcutaneous Daily  . mouth rinse  15 mL Mouth Rinse BID  . multivitamin with minerals  1 tablet Oral Daily  . mupirocin ointment   Nasal BID  . pantoprazole  40 mg Oral BID  . Ensure Max Protein  11 oz Oral Daily  . senna-docusate  1 tablet Oral Daily   Continuous Infusions: . sodium chloride 250 mL (01/01/20 1739)  . sodium chloride 250 mL (01/02/20 1025)  . ampicillin-sulbactam (UNASYN) IV 3 g (01/04/20 0950)  . vancomycin 1,000 mg (01/04/20 0752)     LOS: 15 days    Time spent: 35 minutes    Kendrick Haapala A Kastin Cerda, MD [facility]   If 7PM-7AM, please contact night-coverage [email or web address]  01/04/2020, 12:32 PM

## 2020-01-04 NOTE — TOC Progression Note (Addendum)
Transition of Care [facility]) - Progression Note    Patient Details  Name: Becky Hendricks MRN: [id number] Date of Birth: [birth year]  Transition of [facility]) CM/SW Contact  Gildardo Griffes, [place] Phone Number: 01/04/2020, 3:12 PM  Clinical Narrative:     CSW spoke with patient's [name] regarding patient's potential dc back to [employer].  He is in agreement and asked to be updated on time of day tomorrow when patient is to be transported via [place] back to [place].   CSW spoke with [place] at [place], she requested CSW start insurance auth. Pending PT/OT updated notes to sent to [facility] insurance to obtain auth for patient's potential dc tomorrow.  [facility] reference #: [id number]   Updated covid test ordered.   Expected Discharge Plan: Skilled Nursing Facility Barriers to Discharge: Continued Medical Work up  Expected Discharge Plan and Services Expected Discharge Plan: Skilled Nursing Facility     Post Acute Care Choice: Skilled Nursing Facility Living arrangements for the past 2 months: Skilled Nursing Facility(just at [facility])                                       Social Determinants of Health (SDOH) Interventions    Readmission Risk Interventions No flowsheet data found.

## 2020-01-05 DIAGNOSIS — M86159 Other acute osteomyelitis, unspecified femur: Secondary | ICD-10-CM

## 2020-01-05 LAB — GLUCOSE, CAPILLARY
Glucose-Capillary: 193 mg/dL — ABNORMAL HIGH (ref 70–99)
Glucose-Capillary: 259 mg/dL — ABNORMAL HIGH (ref 70–99)
Glucose-Capillary: 204 mg/dL — ABNORMAL HIGH (ref 70–99)
Glucose-Capillary: 231 mg/dL — ABNORMAL HIGH (ref 70–99)

## 2020-01-05 MED ORDER — FUROSEMIDE 40 MG PO TABS: 40.0000 mg | ORAL_TABLET | Freq: Every day | ORAL | 0 refills | Status: DC

## 2020-01-05 MED ORDER — PANTOPRAZOLE SODIUM 40 MG PO TBEC: 40.0000 mg | DELAYED_RELEASE_TABLET | Freq: Two times a day (BID) | ORAL | 0 refills | Status: DC

## 2020-01-05 MED ORDER — DIPHENHYDRAMINE-ZINC ACETATE 2-0.1 % EX CREA: 1.0000 "application " | TOPICAL_CREAM | Freq: Three times a day (TID) | CUTANEOUS | 0 refills | Status: DC | PRN

## 2020-01-05 MED ORDER — VANCOMYCIN IV (FOR PTA / DISCHARGE USE ONLY)
1000.0000 mg | Freq: Two times a day (BID) | INTRAVENOUS | 0 refills | Status: DC
Start: 2020-01-05 — End: 2020-01-27

## 2020-01-05 MED ORDER — HYDROCORTISONE 1 % EX CREA: TOPICAL_CREAM | Freq: Two times a day (BID) | CUTANEOUS | 0 refills | Status: DC

## 2020-01-05 MED ORDER — DIVALPROEX SODIUM 500 MG PO DR TAB: 500.0000 mg | DELAYED_RELEASE_TABLET | Freq: Two times a day (BID) | ORAL | 0 refills | Status: DC

## 2020-01-05 MED ORDER — PREDNISONE 20 MG PO TABS
20.0000 mg | ORAL_TABLET | Freq: Every day | ORAL | Status: DC
  Administered 2020-01-05 – 2020-01-06 (×2): 20 mg via ORAL
  Filled 2020-01-05 (×2): qty 1

## 2020-01-05 MED ORDER — DOCUSATE SODIUM 100 MG PO CAPS: 100.0000 mg | ORAL_CAPSULE | Freq: Two times a day (BID) | ORAL | 0 refills | Status: DC | PRN

## 2020-01-05 MED ORDER — ENSURE MAX PROTEIN PO LIQD: 11.0000 [oz_av] | Freq: Every day | ORAL | 0 refills | Status: DC

## 2020-01-05 MED ORDER — AMPICILLIN-SULBACTAM IV (FOR PTA / DISCHARGE USE ONLY)
3.0000 g | Freq: Four times a day (QID) | INTRAVENOUS | 0 refills | Status: DC
Start: 2020-01-05 — End: 2020-01-27

## 2020-01-05 MED ORDER — PRO-STAT SUGAR FREE PO LIQD: 30.0000 mL | Freq: Two times a day (BID) | ORAL | 0 refills | Status: DC

## 2020-01-05 MED ORDER — PRO-STAT SUGAR FREE PO LIQD
30.0000 mL | Freq: Two times a day (BID) | ORAL | Status: DC
  Administered 2020-01-05 (×2): 30 mL via ORAL
  Filled 2020-01-05 (×2): qty 30

## 2020-01-05 MED ORDER — INSULIN GLARGINE 100 UNIT/ML ~~LOC~~ SOLN: 8.0000 [IU] | Freq: Every day | SUBCUTANEOUS | 11 refills | Status: DC

## 2020-01-05 NOTE — TOC Progression Note (Signed)
Transition of Care [facility]) - Progression Note    Patient Details  Name: Becky Hendricks MRN: [id number] Date of Birth: 1946/03/29  [facility]) CM/SW Contact  Gildardo Griffes, [place] Phone Number: 01/05/2020, 12:16 PM  Clinical Narrative:     CSW confirmed with [facility] they have received clinicals, however they are still under review. Pending insurance auth at this time.   Expected Discharge Plan: Skilled Nursing Facility Barriers to Discharge: Continued Medical Work up  Expected Discharge Plan and Services Expected Discharge Plan: Skilled Nursing Facility     Post Acute Care Choice: Skilled Nursing Facility Living arrangements for the past 2 months: Skilled Nursing Facility(just at [place]) Expected Discharge Date: 01/05/20                                     Social Determinants of Health (SDOH) Interventions    Readmission Risk Interventions No flowsheet data found.

## 2020-01-05 NOTE — Discharge Summary (Addendum)
Physician Discharge Summary  Becky Hendricks BJY:[id number] DOB: 01/16/1946 DOA: 12/20/2019  PCP: [name], MD  Admit date: 12/20/2019 Discharge date: 01/05/2020  Admitted From: SNF Disposition:  SNF  Recommendations for Outpatient Follow-up:  1. Follow up with PCP in 1-2 weeks 2. Please obtain BMP/CBC in one week 3. Needs to complete antibiotics until 6-10, close follow up with ID 4. Follow up with cardiology for further evaluation.  5. Please make sure patient is follow by wound care.     Discharge Condition: Stable.  CODE STATUS: Full code Diet recommendation: Carb Modified   Brief/Interim Summary: 74 year old with past medical history significant for A. fib on Eliquis, bipolar disorder, hypertension,  diabetes type 2 who presented after out of hospital cardiopulmonary arrest.  Patient was recently discharged on 12/17/2019 for stage IV pressure ulcer to be on Unasyn plus vancomycin until 6/10.  On 5/9, staff at the nursing home reported she didnt " look right" at lunch.  She was later found in her room with agonal  breathing without a pulse.  In the ED, SPO2 100% intubated, blood pressure 120/91 on levophed. , blood glucose 300, lactate of 6.  White blood cell 11.  ABG seven-point 4/60/27/42.  Troponin 76 and 161 6 hours later.  EKG normal sinus rhythm.  Patient was minimally responsive no sedation.  She was a started on Levophed for blood pressure support and transition to meropenem and admitted to [facility] service.  Subsequently weaned off of pressors.  Patient extubated.  Patient continued to be on IV antibiotics.  Patient transferred to the hospitalist service on 12/23/2019. Patient developed a rash, ID think it could be related to acetazolamide.  ID recommend to continue with current antibiotics. Patient developed tachycardia overnight, PA-C.  Cardiology has been consulted due to recent cardiac arrest. Afib RVR overnight (01/01/2020) received IV metoprolol times 2. Coreg increase to 25 mg  BID>    1-Cardiopulmonary arrest: Patient noted to have cardiac/pulmonary arrest on 12/20/2019 the nursing home require 1 or 2 minutes of CPR before ROSC.  -This was thought to be related to septic shock secondary to a stage IV ulcer with osteomyelitis -Patient required IV pressors initially, she has been weaned off -2D echo ejection fraction 60 to 65% -Mild elevated troponin -CT angiogram negative for PE -Developed tachycardia and PACs overnight.  Cardiology has been consulted. Recommend to resume coreg. Cardiology aware of SVT event during admission which resolved after patient was restarted on coreg.  -Recommended to increase coreg as needed/     2-Severe sepsis with septic shock secondary to osteomyelitis of the sacrum coccygeal stage IV pressure ulcer, POA -Secondary to osteomyelitis of the sacrum and cocci. -Patient noted to have polymicrobial decubitus ulcer with a strep viridans bacteremia noted last admission. 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 -Continue with IV antibiotics; Unasyn and vancomycin. Rash has improved. She needs antibiotics until 6-10. Needs to follow up with  ID. Continue with wound care.   3-Acute Metabolic encephalopathy: Secondary to patient acute pulmonary arrest. Head negative for acute abnormalities EEG showed moderate to severe diffuse encephalopathy Patient was also noted to have encephalopathy during prior hospitalizations TSH, B12, normal, RPR non-reactive MRI of the head negative for acute stroke Mental status fluctuates.  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 Continue with IV antibiotics.  rash improving, resolved left arm, PICC line was place.  Wound care, every shift: Insert saline moistened gauze into the left buttock and sacral wounds. Cover with ABD pads, tape in place   5-Paroxysmal A. fib: Continue with Eliquis HR up to 140, received  IV metoprolol. Increase coreg.  HR better controlled  6-Bipolar:  Continue with Depakote 7-DM type II: continue with Lantus and sliding scale 8-Hyperphosphatemia: Continue with calcium binders.  9-Maculopapular rash: ID reconsulted, they think it could be related to acetazolamide Continue with current antibiotics. Received IV Benadryl, Pepcid and prednisone Rash improved.  Topical steroid   10-Bilateral pleural Effusion, LE edema;  Treated  with IV Lasix Chest x-ray 5/18: Show persistent right pleural effusion. Negative 2.7 L.  Change lasix to oral.   11-abdominal pain; KUB Non Obstructive bowel gas pattern mild feccal retention through the colon. Fleet enema ordered. Had multiples BM./   12-SVT; replete mg. On coreg.   Pressure Injury 12/20/19 Buttocks Right Stage 4 - Full thickness tissue loss with exposed bone, tendon or muscle. (Active)  12/20/19 2030  Location: Buttocks  Location Orientation: Right  Staging: Stage 4 - Full thickness tissue loss with exposed bone, tendon or muscle.  Wound Description (Comments):   Present on Admission: Yes     Pressure Injury 12/20/19 Buttocks Left;Proximal Stage 4 - Full thickness tissue loss with exposed bone, tendon or muscle. (Active)  12/20/19 2030  Location: Buttocks  Location Orientation: Left;Proximal  Staging: Stage 4 - Full thickness tissue loss with exposed bone, tendon or muscle.  Wound Description (Comments):   Present on Admission: Yes     Pressure Injury 12/20/19 Buttocks Left;Distal Stage 4 - Full thickness tissue loss with exposed bone, tendon or muscle. (Active)  12/20/19 2030  Location: Buttocks  Location  Orientation: Left;Distal  Staging: Stage 4 - Full thickness tissue loss with exposed bone, tendon or muscle.  Wound Description (Comments):   Present on Admission: Yes      Discharge Diagnoses:  Active Problems:   Pressure injury of skin   Respiratory failure with hypoxia (HCC)   Acute metabolic encephalopathy   Severe sepsis (HCC)   Osteomyelitis (HCC)   Abscess of multiple sites of buttock   AF (paroxysmal atrial fibrillation) (HCC)   Cardiac arrest (HCC)   Hyperphosphatemia   Edema of upper extremity   Palliative care by specialist   Goals of care, counseling/discussion   Drug rash   Acute osteomyelitis of pelvic region and thigh [facility])    Discharge Instructions  Discharge Instructions    [facility] pharmacist to adjust dose for Vancomycin, Aminoglycosides and other anti-infective therapies as requested by physician.   Complete by: As directed    [facility] to provide Cath Flo 558m   Complete by: As directed    Administer for PICC line occlusion and as ordered by physician for other access device issues.   Anaphylaxis Kit: Provided to treat any anaphylactic reaction to the medication being provided to the patient if First Dose or when requested by physician   Complete by: As directed    Epinephrine 146mml vial / amp: Administer 0.58m27m0.58ml6mubcutaneously once for moderate to severe anaphylaxis, nurse to call physician and pharmacy when reaction occurs and call 911 if needed for immediate care   Diphenhydramine 50mg56mIV vial: Administer 25-50mg 15mM PRN  for first dose reaction, rash, itching, mild reaction, nurse to call physician and pharmacy when reaction occurs   Sodium Chloride 0.9% NS 562m IV: Administer if needed for hypovolemic blood pressure drop or as ordered by physician after call to physician with anaphylactic reaction   Change dressing on IV access line weekly and PRN   Complete by: As directed    Diet - low sodium heart healthy    Complete by: As directed    Flush IV access with Sodium Chloride 0.9% and Heparin 10 units/ml or 100 units/ml   Complete by: As directed    Home infusion instructions - [facility]   Complete by: As directed    Instructions: Flush IV access with Sodium Chloride 0.9% and Heparin 10units/ml or 100units/ml   Change dressing on IV access line: Weekly and PRN   Instructions Cath Flo 211m Administer for PICC Line occlusion and as ordered by physician for other access device   [facility] pharmacist to adjust dose for: Vancomycin, Aminoglycosides and other anti-infective therapies as requested by physician   Increase activity slowly   Complete by: As directed    Method of administration may be changed at the discretion of home infusion pharmacist based upon assessment of the patient and/or caregiver's ability to self-administer the medication ordered   Complete by: As directed    Outpatient Parenteral Antibiotic Therapy Information Antibiotic: Vancomycin IVPB; Indications for use: osteomyelitis; End Date: 01/21/2020 On vancomycin and Unasyn   Complete by: As directed    On vancomycin and Unasyn   Antibiotic: Vancomycin IVPB   Indications for use: osteomyelitis   End Date: 01/21/2020     Allergies as of 01/05/2020      Reactions   Chlorhexidine Gluconate Itching   Acetazolamide Er Rash      Medication List    STOP taking these medications   acetaminophen 325 MG tablet Commonly known as: TYLENOL   lisinopril 20 MG tablet Commonly known as: ZESTRIL     TAKE these medications   ampicillin-sulbactam  IVPB Commonly known as: UNASYN Inject 3 g into the vein every 6 (six) hours for 16 days. Indication:  Osteo/abscess of coccyx, First Dose: No Last Day of Therapy:  01/21/2020 Labs - Once weekly:  CBC/D and BMP, Labs - Every other week:  ESR and CRP Method of administration: Mini-Bag Plus / Gravity Method of administration may be changed at the discretion of home  infusion pharmacist based upon assessment of the patient and/or caregiver's ability to self-administer the medication ordered. What changed: additional instructions   apixaban 5 MG Tabs tablet Commonly known as: ELIQUIS Take 5 mg by mouth 2 (two) times daily.   ascorbic acid 1000 MG tablet Commonly known as: VITAMIN C Take 500 mg by mouth 2 (two) times daily.   atorvastatin 10 MG tablet Commonly known as: LIPITOR Take 10 mg by mouth at bedtime.   calcium carbonate 500 MG chewable tablet Commonly known as: TUMS - dosed in mg elemental calcium Chew 1 tablet by mouth daily.   carvedilol 25 MG tablet Commonly known as: COREG Take 1 tablet (25 mg total) by mouth 2 (two) times daily with a meal.   cholecalciferol 25 MCG (1000 UNIT) tablet Commonly known as: VITAMIN D3 Take 1,000 Units by mouth daily.   diphenhydrAMINE-zinc acetate cream Commonly known as: BENADRYL Apply 1 application topically 3 (three) times daily as needed for itching.   divalproex 500 MG DR tablet Commonly known as: DEPAKOTE Take 1 tablet (  500 mg total) by mouth 2 (two) times daily. What changed:   when to take this  Another medication with the same name was removed. Continue taking this medication, and follow the directions you see here.   docusate sodium 100 MG capsule Commonly known as: COLACE Take 1 capsule (100 mg total) by mouth 2 (two) times daily as needed for mild constipation.   Ensure Max Protein Liqd Take 330 mLs (11 oz total) by mouth daily. Start taking on: Jan 06, 2020   feeding supplement (PRO-STAT SUGAR FREE 64) Liqd Take 30 mLs by mouth 2 (two) times daily.   furosemide 40 MG tablet Commonly known as: LASIX Take 1 tablet (40 mg total) by mouth daily. Start taking on: Jan 06, 2020   HumaLOG 100 UNIT/ML cartridge Generic drug: insulin lispro Inject into the skin See admin instructions. Inject into the skin 2 times a day between meals, per sliding scale: BGL 0-200 = give nothing;  201-250 = 2 units; 251-300 = 4 units; 301-350 = 6 units; 351-400 = 8 units; 401-450 = 10 units; 451-500 = 12 units; >450 = 12 units and CALL PROVIDER IF STILL ELEVATED AFTER 2 HOURS   hydrocortisone cream 1 % Apply topically 2 (two) times daily.   insulin glargine 100 UNIT/ML injection Commonly known as: LANTUS Inject 0.08 mLs (8 Units total) into the skin daily. Start taking on: Jan 06, 2020 What changed: how much to take   Ipratropium-Albuterol 20-100 MCG/ACT Aers respimat Commonly known as: COMBIVENT Inhale 1 puff into the lungs every 6 (six) hours. What changed:   when to take this  reasons to take this   Janumet 50-1000 MG tablet Generic drug: sitaGLIPtin-metformin Take 1 tablet by mouth 2 (two) times daily.   OMEGA-3 FISH OIL PO Take 1 capsule by mouth daily.   pantoprazole 40 MG tablet Commonly known as: PROTONIX Take 1 tablet (40 mg total) by mouth 2 (two) times daily.   sennosides-docusate sodium 8.6-50 MG tablet Commonly known as: SENOKOT-S Take 1 tablet by mouth daily.   vancomycin  IVPB Inject 1,000 mg into the vein every 12 (twelve) hours for 16 days. Indication:  Osteo/abscess of coccyx First Dose: No Last Day of Therapy:  01/21/2020 Labs - _0 /25/21 1033          Allergies  Allergen Reactions  . Chlorhexidine Gluconate Itching  . Acetazolamide Er Rash     Consultations:  ID   Procedures/Studies: DG Abd 1 View  Result Date: 12/31/2019 CLINICAL DATA:  History of AFib. EXAM: ABDOMEN - 1 VIEW COMPARISON:  None. FINDINGS: Patient is rotated to the right. Bowel gas pattern is nonobstructive with mild fecal retention throughout the colon. No free peritoneal air. Degenerative change of the spine. Calcification over the right pelvis likely phleboliths. IMPRESSION: Nonobstructive bowel gas pattern with mild fecal retention throughout the colon. Electronically Signed  By: Marin Olp M.D.   On: 12/31/2019 10:53   CT HEAD WO CONTRAST  Result Date: 12/20/2019 CLINICAL DATA:  74 year old female with unresponsiveness. EXAM: CT HEAD WITHOUT CONTRAST TECHNIQUE: Contiguous axial images were obtained from the base of the skull through the vertex without intravenous contrast. COMPARISON:  Head CT dated 12/03/2019. FINDINGS: Brain: There is age-related atrophy and chronic microvascular ischemic changes. There is no acute intracranial hemorrhage. No mass effect or midline shift. No extra-axial fluid collection. Vascular: No hyperdense vessel or unexpected calcification. Skull: Normal. Negative for fracture or focal lesion. Sinuses/Orbits: There is mucoperiosteal thickening of paranasal sinuses with partial opacification of the ethmoid air cells and sphenoid sinuses. The mastoid air cells are clear. Other: An endotracheal and an enteric tube are partially visualized. IMPRESSION: 1. No acute intracranial pathology. 2. Age-related atrophy and chronic microvascular ischemic changes. Electronically Signed   By: Anner Crete M.D.   On: 12/20/2019 20:31   CT ANGIO CHEST PE W OR WO CONTRAST  Result Date: 12/24/2019 CLINICAL DATA:  Shortness of breath EXAM: CT ANGIOGRAPHY CHEST WITH CONTRAST TECHNIQUE: Multidetector CT imaging of the chest was performed using the standard protocol during bolus administration of intravenous contrast. Multiplanar CT image reconstructions  and MIPs were obtained to evaluate the vascular anatomy. CONTRAST:  100 mL OMNIPAQUE IOHEXOL 350 MG/ML SOLN COMPARISON:  Chest radiograph Dec 21, 2019 FINDINGS: Cardiovascular: There is no demonstrable pulmonary embolus. There is no appreciable thoracic aortic aneurysm or dissection. Visualized great vessels appear unremarkable. No appreciable pericardial effusion or pericardial thickening evident. Mediastinum/Nodes: Visualized thyroid appears normal. There is no evident thoracic adenopathy. No esophageal lesions are appreciable. Lungs/Pleura: No pneumothorax. There is a moderate pleural effusion on the right with compressive atelectasis in the right lower lobe. There may well be associated pneumonia in the right lower lobe. There is a smaller left pleural effusion with compressive atelectasis in the left base. Upper Abdomen: There is reflux of contrast into the inferior vena cava and slightly into the hepatic veins. Visualized upper abdominal structures otherwise appear unremarkable. Musculoskeletal: There is elevation of the right hemidiaphragm. There are no blastic or lytic bone lesions. No evident chest wall lesions. Review of the MIP images confirms the above findings. IMPRESSION: 1. No demonstrable pulmonary embolus. No thoracic aortic aneurysm or dissection. 2. Pleural effusions bilaterally, larger on the right than the left. Compressive atelectasis in both lower lobes. Suspect associated pneumonia within the right lower lobe consolidation. 3.  No adenopathy. 4. Reflux of contrast in the inferior vena cava and hepatic veins is felt to be indicative of increased right heart pressure. 5.  Stable elevation the right hemidiaphragm. Electronically Signed   By: Lowella Grip III M.D.   On: 12/24/2019 12:05   MR BRAIN WO CONTRAST  Result Date: 12/24/2019 CLINICAL DATA:  Encephalopathy. EXAM: MRI HEAD WITHOUT CONTRAST TECHNIQUE: Multiplanar, multiecho pulse sequences of the brain and surrounding structures  were obtained without intravenous contrast. COMPARISON:  Head CT Dec 20, 2019 FINDINGS: The study is partially degraded by motion Brain: No acute infarction, hemorrhage, hydrocephalus, extra-axial collection or mass lesion. Scattered foci of T2 hyperintensity are seen within the white matter of the cerebral hemispheres, predominantly periventricular, nonspecific. There is prominence of the supratentorial ventricles, cerebral and cerebellar sulci, reflecting parenchymal volume loss. Vascular: Normal flow voids. Skull and upper cervical spine: Normal marrow signal. Sinuses/Orbits: Fluid level within the right sphenoid sinus mild mucosal thickening of the bilateral ethmoid cells. The orbits are maintained. Other: Bilateral mastoid effusions, right  greater than left. IMPRESSION: 1. No acute intracranial abnormality. 2. Moderate to advanced parenchymal volume loss and mild chronic microvascular ischemic changes. 3. Fluid level within the right sphenoid sinus. Correlate clinically for acute sinusitis. 4. Bilateral mastoid effusions, right greater than left. Electronically Signed   By: Pedro Earls M.D.   On: 12/24/2019 15:10   CT ABDOMEN PELVIS W CONTRAST  Result Date: 12/20/2019 CLINICAL DATA:  Sepsis. Decreased mental status. EXAM: CT ABDOMEN AND PELVIS WITH CONTRAST TECHNIQUE: Multidetector CT imaging of the abdomen and pelvis was performed using the standard protocol following bolus administration of intravenous contrast. CONTRAST:  13m OMNIPAQUE IOHEXOL 300 MG/ML  SOLN COMPARISON:  12/03/2019 FINDINGS: Lower Chest: Small right pleural effusion. Bibasilar atelectasis, right side greater than left. Hepatobiliary: No hepatic masses identified. Gallstones are seen, however there is no evidence of cholecystitis or biliary dilatation. Pancreas:  No mass or inflammatory changes. Spleen: Within normal limits in size and appearance. Adrenals/Urinary Tract: Small cyst again noted in the upper pole of right  kidney no masses identified. No evidence of ureteral calculi or hydronephrosis. Foley catheter seen within the bladder, which is empty. Stomach/Bowel: Nasogastric tube is seen with tip in the duodenal bulb. No evidence of obstruction, inflammatory process or abnormal fluid collections. Vascular/Lymphatic: No pathologically enlarged lymph nodes. No abdominal aortic aneurysm. Congenital duplication of IVC incidentally noted. Reproductive: Several uterine fibroids are seen, largest posteriorly measuring 4.3 cm. Adnexal regions are unremarkable. Other: Previously seen fistula and abscess in the left buttock is significantly decreased in size since previous study. Musculoskeletal: A decubitus ulcer just to the right of midline along the gluteal crease remains unchanged, with stable appearance of osteomyelitis involving distal sacrum and coccyx. IMPRESSION: 1. 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 2. Stable decubitus ulcer with osteomyelitis involving distal sacrum and coccyx. 3. Cholelithiasis. No radiographic evidence of cholecystitis. 4. Stable small uterine fibroids. 5. Increased small right pleural effusion and bibasilar atelectasis. Electronically Signed   By: JMarlaine HindM.D.   On: 12/20/2019 20:33   DG CHEST PORT 1 VIEW  Result Date: 12/29/2019 CLINICAL DATA:  Shortness of breath EXAM: PORTABLE CHEST 1 VIEW COMPARISON:  12/21/2019 FINDINGS: Interval extubation. Enteric tube is no longer present. Central line is no longer present. Low lung volumes with persistent elevation of the right hemidiaphragm. Probable persistent right pleural effusion and right basilar atelectasis. Stable cardiomediastinal contours. IMPRESSION: Persistent elevation of the right hemidiaphragm with probable right pleural effusion and right basilar atelectasis. Electronically Signed   By: PMacy MisM.D.   On: 12/29/2019 09:59   DG CHEST PORT 1 VIEW  Result Date: 12/21/2019 CLINICAL  DATA:  Shortness of breath, sepsis, respiratory failure EXAM: PORTABLE CHEST 1 VIEW COMPARISON:  Portable exam 1045 hours compared to 12/20/2019 FINDINGS: Tip of endotracheal tube projects 3.6 cm above carina. Nasogastric tube extends into stomach. LEFT subclavian central venous catheter with tip projecting over LEFT brachiocephalic vein near SVC confluence. Normal heart size mediastinal contours. Bibasilar atelectasis greater on RIGHT. Small RIGHT pleural effusion. No pneumothorax. IMPRESSION: Bibasilar atelectasis greater on RIGHT with small associated RIGHT pleural effusion. Electronically Signed   By: MLavonia DanaM.D.   On: 12/21/2019 11:10   DG Chest Port 1 View  Result Date: 12/20/2019 CLINICAL DATA:  Central line placement EXAM: PORTABLE CHEST 1 VIEW COMPARISON:  12/20/2019 FINDINGS: Single frontal view of the chest demonstrates endotracheal tube and enteric catheter unchanged. There is a left internal jugular central venous  catheter, which courses retrograde and is coiled over the region of the left subclavian vein before terminating over the region of the superior vena cava. Please correlate with catheter function. Cardiac silhouette is stable. Increased veiling opacity at the right lung base consistent with progressive consolidation and/or effusion. There is no pneumothorax. IMPRESSION: 1. Left internal jugular catheter coiled over the left subclavian vein, with tip normally positioned over the superior vena cava. Please correlate with catheter function. 2. Increasing right basilar consolidation and/or effusion. These results will be called to the ordering clinician or representative by the Radiologist Assistant, and communication documented in the PACS or [employer]. Electronically Signed   By: Randa Ngo M.D.   On: 12/20/2019 19:14   DG Chest Port 1 View  Result Date: 12/20/2019 CLINICAL DATA:  Cardiac arrest EXAM: PORTABLE CHEST 1 VIEW COMPARISON:  Chest radiograph dated 12/14/2019.  FINDINGS: Defibrillator pads overlie the left chest. An endotracheal tube terminates in the upper thoracic trachea. An enteric tube enters the stomach and terminates below the field of view. The heart remains enlarged. Mild bibasilar atelectasis/airspace disease is noted. A small right pleural effusion may contribute. There is no left pleural effusion. There is no pneumothorax. IMPRESSION: Mild bibasilar atelectasis/airspace disease. A small right pleural effusion may contribute. Electronically Signed   By: Zerita Boers M.D.   On: 12/20/2019 16:17   DG Chest Port 1 View  Result Date: 12/14/2019 CLINICAL DATA:  Tachypnea. Evaluate for pneumonia. EXAM: PORTABLE CHEST 1 VIEW COMPARISON:  Radiographs 12/09/2019 and 12/03/2019. Abdominal CT 12/03/2019. FINDINGS: 1250 hours. Increased patient rotation to the left. The heart size and mediastinal contours are stable. There is persistent elevation of the right hemidiaphragm with associated right basilar airspace disease and a possible small right pleural effusion. Mild atelectasis is present at the left lung base. No edema or pneumothorax. The bones appear unchanged. IMPRESSION: Persistent right basilar infiltrate versus atelectasis with possible small right pleural effusion. Electronically Signed   By: Richardean Sale M.D.   On: 12/14/2019 13:07   DG CHEST PORT 1 VIEW  Result Date: 12/09/2019 CLINICAL DATA:  Hypoxia EXAM: PORTABLE CHEST 1 VIEW COMPARISON:  12/03/2019 FINDINGS: Elevation of the right hemidiaphragm. Right base atelectasis. No confluent opacity on the left. Mild cardiomegaly. No effusions or edema. No acute bony abnormality. IMPRESSION: Stable elevation of the right hemidiaphragm with right base atelectasis. Electronically Signed   By: Rolm Baptise M.D.   On: 12/09/2019 10:51   EEG adult  Result Date: 12/21/2019 Lora Havens, MD     12/21/2019  1:42 PM Patient Name: Aubrii Sharpless MRN: [id number] Epilepsy Attending: Lora Havens Referring  Physician/Provider: Salvadore Dom, NP Date: 12/21/2019 Duration: 24.28 mins Patient history: 74 yo F with h/o AF on Eliquis, chronic respiratory failure on 2L home O2, and stage IV decubitus ulcer presented to [facility] after out of hospital cardiopulmonary arrest. EEG to evaluate for seizure. Level of alertness: Awake AEDs during EEG study: Awake, sleep Technical aspects: This EEG study was done with scalp electrodes positioned according to the 10-20 International system of electrode placement. Electrical activity was acquired at a sampling rate of 500Hz and reviewed with a high frequency filter of 70Hz and a low frequency filter of 1Hz. EEG data were recorded continuously and digitally stored. Description: During awake state, no clear posterior rhythm was seen.  Sleep was characterized by vertex waves, maximal frontocentral region.  EEG showed continuous generalized 3 to 5 Hz theta-delta slowing.  Hyperventilation and photic stimulation were not  performed. Abnormality -Continuous slow, generalized IMPRESSION: This study is suggestive of moderate to severe diffuse encephalopathy, nonspecific etiology. No seizures or definite epileptiform discharges were seen throughout the recording. Lora Havens   ECHOCARDIOGRAM COMPLETE  Result Date: 12/23/2019    ECHOCARDIOGRAM REPORT   Patient Name:   SHAKARI QAZI Date of Exam: 12/23/2019 Medical Rec #:  [id number]      Height:       64.0 in Accession #:    [id number]     Weight:       221.1 lb Date of Birth:  1945/10/07       BSA:          2.042 m Patient Age:    12 years       BP:           133/59 mmHg Patient Gender: F              HR:           66 bpm. Exam Location:  Inpatient Procedure: 2D Echo Indications:    cardiopulmonary arrest  History:        Patient has prior history of Echocardiogram examinations, most                 recent 12/08/2019. Arrythmias:Atrial Fibrillation; Risk                 Factors:Diabetes and Hypertension.  Sonographer:    Jannett Celestine RDCS (AE)  Referring Phys: [id number] Eugenie Filler  Sonographer Comments: limited mobility. off axis apical windows IMPRESSIONS  1. Since the last study on 12/08/2019 LVEF hasn't changes. Right ventricle is now moderately dilated with moderately decreased systolic function and new moderate pulmonary hypertension. Consider pulmonary embolism.  2. Left ventricular ejection fraction, by estimation, is 65 to 70%. The left ventricle has normal function. The left ventricle has no regional wall motion abnormalities. Left ventricular diastolic parameters are consistent with Grade II diastolic dysfunction (pseudonormalization).  3. Right ventricular systolic function is moderately reduced. The right ventricular size is moderately enlarged. There is moderately elevated pulmonary artery systolic pressure. The estimated right ventricular systolic pressure is 54.0 mmHg.  4. Left atrial size was mildly dilated.  5. Right atrial size was moderately dilated.  6. The mitral valve is normal in structure. No evidence of mitral valve regurgitation. No evidence of mitral stenosis.  7. The aortic valve is normal in structure. Aortic valve regurgitation is not visualized. Mild to moderate aortic valve sclerosis/calcification is present, without any evidence of aortic stenosis.  8. The inferior vena cava is dilated in size with <50% respiratory variability, suggesting right atrial pressure of 15 mmHg. FINDINGS  Left Ventricle: Left ventricular ejection fraction, by estimation, is 65 to 70%. The left ventricle has normal function. The left ventricle has no regional wall motion abnormalities. The left ventricular internal cavity size was normal in size. There is  no left ventricular hypertrophy. Left ventricular diastolic parameters are consistent with Grade II diastolic dysfunction (pseudonormalization). Normal left ventricular filling pressure. Right Ventricle: The right ventricular size is moderately enlarged. No increase in right ventricular wall  thickness. Right ventricular systolic function is moderately reduced. There is moderately elevated pulmonary artery systolic pressure. The tricuspid  regurgitant velocity is 3.34 m/s, and with an assumed right atrial pressure of 15 mmHg, the estimated right ventricular systolic pressure is 08.6 mmHg. Left Atrium: Left atrial size was mildly dilated. Right Atrium: Right atrial size was moderately dilated. Pericardium: There is no evidence  of pericardial effusion. Mitral Valve: The mitral valve is normal in structure. Normal mobility of the mitral valve leaflets. No evidence of mitral valve regurgitation. No evidence of mitral valve stenosis. Tricuspid Valve: The tricuspid valve is normal in structure. Tricuspid valve regurgitation is mild . No evidence of tricuspid stenosis. Aortic Valve: The aortic valve is normal in structure. Aortic valve regurgitation is not visualized. Mild to moderate aortic valve sclerosis/calcification is present, without any evidence of aortic stenosis. Pulmonic Valve: The pulmonic valve was normal in structure. Pulmonic valve regurgitation is not visualized. No evidence of pulmonic stenosis. Aorta: The aortic root is normal in size and structure. Venous: The inferior vena cava is dilated in size with less than 50% respiratory variability, suggesting right atrial pressure of 15 mmHg. IAS/Shunts: No atrial level shunt detected by color flow Doppler.  LEFT VENTRICLE PLAX 2D LVIDd:         5.00 cm  Diastology LVIDs:         3.00 cm  LV e' lateral:   8.05 cm/s LV PW:         1.00 cm  LV E/e' lateral: 10.6 LV IVS:        1.00 cm  LV e' medial:    8.70 cm/s LVOT diam:     2.00 cm  LV E/e' medial:  9.8 LV SV:         54 LV SV Index:   26 LVOT Area:     3.14 cm  RIGHT VENTRICLE RV S prime:     10.00 cm/s TAPSE (M-mode): 2.0 cm LEFT ATRIUM           Index       RIGHT ATRIUM           Index LA diam:      4.40 cm 2.16 cm/m  RA Area:     17.20 cm LA Vol (A4C): 30.1 ml 14.74 ml/m RA Volume:   43.10  ml  21.11 ml/m  AORTIC VALVE LVOT Vmax:   81.60 cm/s LVOT Vmean:  50.700 cm/s LVOT VTI:    0.172 m  AORTA Ao Root diam: 2.80 cm MITRAL VALVE               TRICUSPID VALVE MV Area (PHT): 3.91 cm    TR Peak grad:   44.6 mmHg MV Decel Time: 194 msec    TR Vmax:        334.00 cm/s MV E velocity: 85.30 cm/s MV A velocity: 84.40 cm/s  SHUNTS MV E/A ratio:  1.01        Systemic VTI:  0.17 m                            Systemic Diam: 2.00 cm Ena Dawley MD Electronically signed by Ena Dawley MD Signature Date/Time: 12/23/2019/1:25:23 PM    Final    ECHOCARDIOGRAM COMPLETE  Result Date: 12/08/2019    ECHOCARDIOGRAM REPORT   Patient Name:   MIKAILI FLIPPIN Date of Exam: 12/08/2019 Medical Rec #:  [id number]      Height:       66.0 in Accession #:    [id number]     Weight:       242.5 lb Date of Birth:  06-07-1946       BSA:          2.170 m Patient Age:    72 years       BP:  150/120 mmHg Patient Gender: F              HR:           96 bpm. Exam Location:  Inpatient Procedure: 2D Echo, Cardiac Doppler and Color Doppler Indications:    Endocarditis I38  History:        Patient has no prior history of Echocardiogram examinations.                 Arrythmias:Atrial Fibrillation; Risk Factors:Hypertension and                 Diabetes. Sepsis. Oseomyelitis.  Sonographer:    Clayton Lefort RDCS (AE) Referring Phys: [id number] JEFFREY [facility]  Sonographer Comments: Limited patient mobility. IMPRESSIONS  1. Normal LV systolic function; grade 1 diastolic dysfunction; mild LVH.  2. Left ventricular ejection fraction, by estimation, is 60 to 65%. The left ventricle has normal function. The left ventricle has no regional wall motion abnormalities. There is mild left ventricular hypertrophy. Left ventricular diastolic parameters are consistent with Grade I diastolic dysfunction (impaired relaxation).  3. Right ventricular systolic function is normal. The right ventricular size is normal. There is mildly elevated pulmonary artery  systolic pressure.  4. The mitral valve is normal in structure. Trivial mitral valve regurgitation. No evidence of mitral stenosis.  5. The aortic valve is tricuspid. Aortic valve regurgitation is not visualized. No aortic stenosis is present.  6. The inferior vena cava is normal in size with greater than 50% respiratory variability, suggesting right atrial pressure of 3 mmHg. FINDINGS  Left Ventricle: Left ventricular ejection fraction, by estimation, is 60 to 65%. The left ventricle has normal function. The left ventricle has no regional wall motion abnormalities. The left ventricular internal cavity size was normal in size. There is  mild left ventricular hypertrophy. Left ventricular diastolic parameters are consistent with Grade I diastolic dysfunction (impaired relaxation). Right Ventricle: The right ventricular size is normal.Right ventricular systolic function is normal. There is mildly elevated pulmonary artery systolic pressure. The tricuspid regurgitant velocity is 2.90 m/s, and with an assumed right atrial pressure of  3 mmHg, the estimated right ventricular systolic pressure is 14.4 mmHg. Left Atrium: Left atrial size was normal in size. Right Atrium: Right atrial size was normal in size. Pericardium: There is no evidence of pericardial effusion. Mitral Valve: The mitral valve is normal in structure. Normal mobility of the mitral valve leaflets. Trivial mitral valve regurgitation. No evidence of mitral valve stenosis. Tricuspid Valve: The tricuspid valve is normal in structure. Tricuspid valve regurgitation is mild . No evidence of tricuspid stenosis. Aortic Valve: The aortic valve is tricuspid. Aortic valve regurgitation is not visualized. No aortic stenosis is present. Pulmonic Valve: The pulmonic valve was normal in structure. Pulmonic valve regurgitation is trivial. No evidence of pulmonic stenosis. Aorta: The aortic root is normal in size and structure. Venous: The inferior vena cava is normal in  size with greater than 50% respiratory variability, suggesting right atrial pressure of 3 mmHg. IAS/Shunts: No atrial level shunt detected by color flow Doppler. Additional Comments: Normal LV systolic function; grade 1 diastolic dysfunction; mild LVH.  LEFT VENTRICLE PLAX 2D LVIDd:         4.56 cm LVIDs:         3.01 cm LV PW:         1.22 cm LV IVS:        1.42 cm LVOT diam:     2.00 cm LV SV:  79 LV SV Index:   36 LVOT Area:     3.14 cm  RIGHT VENTRICLE          IVC RV Basal diam:  2.18 cm  IVC diam: 1.52 cm TAPSE (M-mode): 2.1 cm LEFT ATRIUM             Index       RIGHT ATRIUM          Index LA diam:        4.20 cm 1.94 cm/m  RA Area:     8.59 cm LA Vol (A2C):   51.9 ml 23.91 ml/m RA Volume:   14.80 ml 6.82 ml/m LA Vol (A4C):   44.0 ml 20.27 ml/m LA Biplane Vol: 49.1 ml 22.62 ml/m  AORTIC VALVE LVOT Vmax:   128.60 cm/s LVOT Vmean:  72.060 cm/s LVOT VTI:    0.251 m  AORTA Ao Root diam: 3.10 cm Ao Asc diam:  3.80 cm TRICUSPID VALVE TR Peak grad:   33.6 mmHg TR Vmax:        290.00 cm/s  SHUNTS Systemic VTI:  0.25 m Systemic Diam: 2.00 cm Kirk Ruths MD Electronically signed by Kirk Ruths MD Signature Date/Time: 12/08/2019/1:14:56 PM    Final    [place] EKG SITE RITE  Result Date: 01/04/2020 If Site Rite image not attached, placement could not be confirmed due to current cardiac rhythm.  [place] EKG SITE RITE  Result Date: 12/27/2019 If Site Rite image not attached, placement could not be confirmed due to current cardiac rhythm.     Subjective: Alert, no new complaints.   Discharge Exam: Vitals:   01/05/20 0514 01/05/20 0811  BP: (!) 180/75 (!) 162/82  Pulse: 62 (!) 59  Resp: (!) 22 18  Temp: 98 F (36.7 C) 97.6 F (36.4 C)  SpO2: (!) 89% 91%     General: Pt is alert, awake, not in acute distress Cardiovascular: RRR, S1/S2 +, no rubs, no gallops Respiratory: CTA bilaterally, no wheezing, no rhonchi Abdominal: Soft, NT, ND, bowel sounds + Extremities: no edema, no cyanosis,   Skin; rash improved.     The results of significant diagnostics from this hospitalization (including imaging, microbiology, ancillary and laboratory) are listed below for reference.     Microbiology: Recent Results (from the past 240 hour(s))  MRSA PCR Screening     Status: Abnormal   Collection Time: 01/03/20  7:05 AM   Specimen: Nasal Mucosa; Nasopharyngeal  Result Value Ref Range Status   MRSA by PCR POSITIVE (A) NEGATIVE Final    Comment:        The GeneXpert MRSA Assay (FDA approved for NASAL specimens only), is one component of a comprehensive MRSA colonization surveillance program. It is not intended to diagnose MRSA infection nor to guide or monitor treatment for MRSA infections. RESULT CALLED TO, READ BACK BY AND VERIFIED WITH: RN TAMMY PENNINGTON 01/03/20 0924 KB   SARS CORONAVIRUS 2 (TAT 6-24 HRS) Nasopharyngeal Nasopharyngeal Swab     Status: None   Collection Time: 01/04/20 12:38 PM   Specimen: Nasopharyngeal Swab  Result Value Ref Range Status   SARS Coronavirus 2 NEGATIVE NEGATIVE Final    Comment: (NOTE) SARS-CoV-2 target nucleic acids are NOT DETECTED. The SARS-CoV-2 RNA is generally detectable in upper and lower respiratory specimens during the acute phase of infection. Negative results do not preclude SARS-CoV-2 infection, do not rule out co-infections with other pathogens, and should not be used as the sole basis for treatment or other patient  management decisions. Negative results must be combined with clinical observations, patient history, and epidemiological information. The expected result is Negative. Fact Sheet for Patients: SugarRoll.be Fact Sheet for Healthcare Providers: https://www.woods-mathews.com/ This test is not yet approved or cleared by the [place] FDA and  has been authorized for detection and/or diagnosis of SARS-CoV-2 by FDA under an Emergency Use Authorization (EUA). This EUA will  remain  in effect (meaning this test can be used) for the duration of the COVID-19 declaration under Section 56 4(b)(1) of the Act, 21 U.S.C. section 360bbb-3(b)(1), unless the authorization is terminated or revoked sooner. Performed at [facility], [place] [address], [place], [place] [address]      Labs: BNP (last 3 results) Recent Labs    09/07/19 1950 12/29/19 0451  BNP 37.6 998.3*   Basic Metabolic Panel: Recent Labs  Lab 12/30/19 0423 12/31/19 0817 01/01/20 0512 01/02/20 1236 01/03/20 0735  NA 141 139 142 141 142  K 4.0 3.6 4.0 4.4 4.0  CL 101 99 99 97* 99  CO2 25 32 35* 35* 33*  GLUCOSE 177* 215* 161* 252* 152*  BUN _0 CREATININE 0.48 0.48 0.43* 0.41* 0.31*  CALCIUM 8.5* 8.6* 8.4* 8.2* 8.4*  MG 1.8 1.7  --   --   --    Liver Function Tests: No results for input(s): AST, ALT, ALKPHOS, BILITOT, PROT, ALBUMIN in the last 168 hours. No results for input(s): LIPASE, AMYLASE in the last 168 hours. Recent Labs  Lab 12/30/19 1019  AMMONIA 30   CBC: Recent Labs  Lab 12/30/19 0423 01/01/20 0512 01/03/20 0735  WBC 8.0 8.4 6.9  NEUTROABS 4.6  --   --   HGB 11.2* 10.8* 10.4*  HCT 37.0 36.7 35.9*  MCV 94.9 97.3 98.9  PLT 191 176 160   Cardiac Enzymes: No results for input(s): CKTOTAL, CKMB, CKMBINDEX, TROPONINI in the last 168 hours. BNP: Invalid input(s): POCBNP CBG: Recent Labs  Lab 01/04/20 0631 01/04/20 1106 01/04/20 1633 01/04/20 2110 01/05/20 0639  GLUCAP 140* 207* 179* 228* 193*   D-Dimer No results for input(s): DDIMER in the last 72 hours. Hgb A1c No results for input(s): HGBA1C in the last 72 hours. Lipid Profile No results for input(s): CHOL, HDL, LDLCALC, TRIG, CHOLHDL, LDLDIRECT in the last 72 hours. Thyroid function studies No results for input(s): TSH, T4TOTAL, T3FREE, THYROIDAB in the last 72 hours.  Invalid input(s): FREET3 Anemia work up No results for input(s): VITAMINB12, FOLATE, FERRITIN, TIBC, IRON,  RETICCTPCT in the last 72 hours. Urinalysis    Component Value Date/Time   COLORURINE YELLOW 12/20/2019 1643   APPEARANCEUR HAZY (A) 12/20/2019 1643   LABSPEC 1.019 12/20/2019 1643   PHURINE 5.0 12/20/2019 1643   GLUCOSEU 50 (A) 12/20/2019 1643   HGBUR NEGATIVE 12/20/2019 1643   BILIRUBINUR NEGATIVE 12/20/2019 1643   KETONESUR NEGATIVE 12/20/2019 1643   PROTEINUR 100 (A) 12/20/2019 1643   NITRITE NEGATIVE 12/20/2019 1643   LEUKOCYTESUR NEGATIVE 12/20/2019 1643   Sepsis Labs Invalid input(s): PROCALCITONIN,  WBC,  LACTICIDVEN Microbiology Recent Results (from the past 240 hour(s))  MRSA PCR Screening     Status: Abnormal   Collection Time: 01/03/20  7:05 AM   Specimen: Nasal Mucosa; Nasopharyngeal  Result Value Ref Range Status   MRSA by PCR POSITIVE (A) NEGATIVE Final    Comment:        The GeneXpert MRSA Assay (FDA approved for NASAL specimens only), is one component of a comprehensive MRSA  colonization surveillance program. It is not intended to diagnose MRSA infection nor to guide or monitor treatment for MRSA infections. RESULT CALLED TO, READ BACK BY AND VERIFIED WITH: RN TAMMY PENNINGTON 01/03/20 0924 KB   SARS CORONAVIRUS 2 (TAT 6-24 HRS) Nasopharyngeal Nasopharyngeal Swab     Status: None   Collection Time: 01/04/20 12:38 PM   Specimen: Nasopharyngeal Swab  Result Value Ref Range Status   SARS Coronavirus 2 NEGATIVE NEGATIVE Final    Comment: (NOTE) SARS-CoV-2 target nucleic acids are NOT DETECTED. The SARS-CoV-2 RNA is generally detectable in upper and lower respiratory specimens during the acute phase of infection. Negative results do not preclude SARS-CoV-2 infection, do not rule out co-infections with other pathogens, and should not be used as the sole basis for treatment or other patient management decisions. Negative results must be combined with clinical observations, patient history, and epidemiological information. The expected result is  Negative. Fact Sheet for Patients: SugarRoll.be Fact Sheet for Healthcare Providers: https://www.woods-mathews.com/ This test is not yet approved or cleared by the [place] FDA and  has been authorized for detection and/or diagnosis of SARS-CoV-2 by FDA under an Emergency Use Authorization (EUA). This EUA will remain  in effect (meaning this test can be used) for the duration of the COVID-19 declaration under Section 56 4(b)(1) of the Act, 21 U.S.C. section 360bbb-3(b)(1), unless the authorization is terminated or revoked sooner. Performed at [facility], [place] [address], [place], [place] [address]      Time coordinating discharge: 40 minutes  SIGNED:   Elmarie Shiley, MD  [facility]

## 2020-01-05 NOTE — Progress Notes (Signed)
PHARMACY CONSULT NOTE FOR:  OUTPATIENT  PARENTERAL ANTIBIOTIC THERAPY (OPAT)  Indication: Osteo/abscess of coccyx, Regimen: Unasyn 3g IV q6hrs, Vanco 1g IV q12 hrs End date: 01/21/2020   IV antibiotic discharge orders are pended. To discharging provider:  please sign these orders via discharge navigator,  Select New Orders & click on the button choice - Manage This Unsigned Work.     Thank you for allowing pharmacy to be a part of this patient's care.   Jody Silas S. Merilynn Finland, PharmD, BCPS Clinical Staff Pharmacist [email or web address] Pasty Spillers 01/05/2020, 7:52 AM

## 2020-01-05 NOTE — Progress Notes (Signed)
Palliative:  HPI: 74 year old with past medical history significant for A. fib on Eliquis, bipolar disorder, hypertension, diabetes type 2 who presented after out of hospital cardiopulmonary arrest. Patient was recently discharged on 12/17/2019 for stage IV pressure ulcer to be on Unasyn plus vancomycin until 6/10. On 5/9, staff at the nursing home reported she didnt " look right" at lunch. She was later found in her room with agonal breathing without a pulse. In the ED, SPO2 100% intubated, blood pressure 120/91 on levophed. , blood glucose 300, lactate of 6. White blood cell 11. ABG seven-point 4/60/27/42. Troponin 76 and 161 6 hours later. EKG normal sinus rhythm. Patient was minimally responsive no sedation. She was a started on Levophed for blood pressure support and transition to meropenem and admitted to [facility] service. 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 She has developed maculopapular rash possibly due to antibiotic therapy and ID following for recommendations.   I went to meet with Becky Hendricks and her [name] for scheduled meeting. Becky Hendricks is sleeping and Becky Hendricks is not at bedside. I called Becky Hendricks who is not planning to be present at hospital today given plan for discharge. However we were able to have a good conversation over the phone.   Becky Hendricks explains that Becky Hendricks was living independently until Dec 20th when she was hospitalized at [facility] and has had multiple complications with COVID, osteomyelitis, C. diff. We spent time discussing the risk of recurrent C. diff infection and further complications. Discussed poor prognosis and high mortality with ongoing osteomyelitis and poor functional status. Becky Hendricks understands that prognosis is poor. He also shares that he has been asked about code status and would not be able to live with himself if he did not try everything for  Becky Hendricks to give her a chance. I encouraged him to continue speaking with Becky Hendricks and monitoring her quality of life. I encouraged him to reconsider if she continues on a downward trajectory of the benefits vs pains of putting her through further resuscitative efforts. Becky Hendricks understands and appreciates the conversation.   All questions/concerns addressed. Emotional support provided.   Exam: Sleeping - I did not awaken. No distress. Breathing regular and unlabored.   Plan: - Continue full code, full aggressive care.  - Recommend outpatient palliative to follow. Recommend further palliative consults if rehospitalized.   25 min  Yong Channel, NP Palliative Medicine Team Pager [phone] (Please see [email or web address] for schedule) Team Phone [phone]    Greater than 50%  of this time was spent counseling and coordinating care related to the above assessment and plan

## 2020-01-05 NOTE — Social Work (Signed)
CSW called [employer] to check status of auth. Becky Hendricks is still pending.   Jimmy Picket, Theresia Majors, [place] Clinical Social Worker [phone]

## 2020-01-05 NOTE — Progress Notes (Signed)
Physical Therapy Treatment Patient Details Name: Becky Hendricks MRN: [id number] DOB: 08/03/1946 Today's Date: 01/05/2020    History of Present Illness 74 Y/o with PMH of afib, bipolar, HTN, Obesity, DM, Pressure ulcer with recent admission for left buttock abscess s.p drainage brought to ED after arrest requiring brief CPR at nursing home.  pt was intubated in the field.  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  Pt extubated 5/11.    PT Comments    Pt admitted with above diagnosis. Pt was able to participate in sitting EOB today with min to max assist of 2 varying for up to 4 min.   Met 0/6 goals set as pt with slow progress due to self limiting at times as well as pt with poor tolerance for activity. Goals revised today.  Will continue PT and recommend SNF. Pt currently with functional limitations due to the deficits listed below (see PT Problem List). Pt will benefit from skilled PT to increase their independence and safety with mobility to allow discharge to the venue listed below.     Follow Up Recommendations  SNF     Equipment Recommendations  None recommended by PT    Recommendations for Other Services       Precautions / Restrictions Precautions Precautions: Fall Precaution Comments: sacral wound, stage IV with osteo Restrictions Weight Bearing Restrictions: No    Mobility  Bed Mobility Overal bed mobility: Needs Assistance Bed Mobility: Rolling Rolling: Max assist Sidelying to sit: +2 for physical assistance;Total assist;+2 for safety/equipment Supine to sit: Total assist;+2 for physical assistance;+2 for safety/equipment Sit to supine: Total assist;+2 for physical assistance   General bed mobility comments: Pt requiring guidance of BUE/trunk/BLEs side to side and for LUE to reach for side rail.  Transfers                 General transfer comment: Pt may d/c today therefore  decided to perform EOB activity and not get pt OOB  Ambulation/Gait             General Gait Details: unable   Stairs             Wheelchair Mobility    Modified Rankin (Stroke Patients Only)       Balance Overall balance assessment: Needs assistance Sitting-balance support: Feet supported;Bilateral upper extremity supported Sitting balance-Leahy Scale: Poor Sitting balance - Comments: Pt requiring intermittent assist for sitting upright, but pt prefers to be left in supportive sitting for any functional task.  Placed the bedside table in front of pt to encourage forward lean which helped pt to sit a little more upright with less support.  Pt still with posterior pelvic tilt even though trying to get anterior tilt. Leans posteriorly. Tried to also get pt to weight shift to her right on elbow but pt too rigid and moans when attempts were made.  Sat EOB for 4 minutes prior to aksing to lie down.  Varied from min to max asssit and cues.  Postural control: Posterior lean                                  Cognition Arousal/Alertness: Awake/alert Behavior During Therapy: Flat affect;Anxious Overall Cognitive Status: No family/caregiver present to determine baseline cognitive functioning Area of Impairment: Awareness;Safety/judgement;Problem solving  Orientation Level: Time Current Attention Level: Selective Memory: Decreased recall of precautions;Decreased short-term memory Following Commands: Follows one step commands inconsistently;Follows one step commands with increased time Safety/Judgement: Decreased awareness of safety;Decreased awareness of deficits Awareness: Intellectual Problem Solving: Slow processing;Requires verbal cues;Requires tactile cues General Comments: "I want to sit up."      Exercises General Exercises - Upper Extremity Shoulder Flexion: AAROM;AROM;Both;10 reps;Supine Elbow Flexion: AROM;AAROM;Both;10  reps;Supine Elbow Extension: AROM;AAROM;Both;10 reps;Supine General Exercises - Lower Extremity Ankle Circles/Pumps: PROM;Both;10 reps;Supine Heel Slides: PROM;Both;10 reps;Supine    General Comments General comments (skin integrity, edema, etc.): VSS      Pertinent Vitals/Pain Pain Assessment: Faces Faces Pain Scale: Hurts even more Pain Location: wound and joints Pain Descriptors / Indicators: Grimacing Pain Intervention(s): Limited activity within patient's tolerance;Monitored during session;Repositioned    Home Living                      Prior Function            PT Goals (current goals can now be found in the care plan section) Acute Rehab PT Goals PT Goal Formulation: With patient Time For Goal Achievement: 01/19/20 Potential to Achieve Goals: Fair Progress towards PT goals: Progressing toward goals    Frequency    Min 2X/week      PT Plan Current plan remains appropriate    Co-evaluation              AM-PAC PT "6 Clicks" Mobility   Outcome Measure  Help needed turning from your back to your side while in a flat bed without using bedrails?: A Lot Help needed moving from lying on your back to sitting on the side of a flat bed without using bedrails?: A Lot Help needed moving to and from a bed to a chair (including a wheelchair)?: A Lot Help needed standing up from a chair using your arms (e.g., wheelchair or bedside chair)?: A Lot Help needed to walk in hospital room?: Total Help needed climbing 3-5 steps with a railing? : Total 6 Click Score: 10    End of Session Equipment Utilized During Treatment: Oxygen Activity Tolerance: Patient limited by fatigue Patient left: in bed;with call bell/phone within reach Nurse Communication: Mobility status;Need for lift equipment PT Visit Diagnosis: Other abnormalities of gait and mobility (R26.89);Muscle weakness (generalized) (M62.81)     Time: 3073-5430 PT Time Calculation (min) (ACUTE ONLY):  12 min  Charges:  $Therapeutic Activity: 8-22 mins                     Kwanza Cancelliere W,PT Acute Rehabilitation Services Pager:  [phone]  Office:  [place] 01/05/2020, 11:12 AM

## 2020-01-05 NOTE — Progress Notes (Signed)
Nutrition Follow-up  RD working remotely.  DOCUMENTATION CODES:   Obesity unspecified  INTERVENTION:   -Continue MVI with minerals daily -30 ml Prostat BID, each supplement provides 100 kcals and 15 grams protein -Continue Magic cup BID with meals, each supplement provides 290 kcal and 9 grams of protein  NUTRITION DIAGNOSIS:   Increased nutrient needs related to wound healing as evidenced by estimated needs.  Ongoing  GOAL:   Patient will meet greater than or equal to 90% of their needs  Progressing   MONITOR:   PO intake, Supplement acceptance, Labs, Weight trends, Skin, I & O's  REASON FOR ASSESSMENT:   Ventilator, Consult Enteral/tube feeding initiation and management  ASSESSMENT:   74 yo female admitted with septic shock, required brief CPR at SNF, intubated by EMS. PMH includes A fib, bipolar D/O, C diff colitis, HTN, obesity, DM-2, stage 4 pressure injury, L buttock abscess.  5/11- extubated 5/12- s/p BSE- advanced to regular diet with thin liquids 5/24- PICC placed   Reviewed I/O's: -2.2 L x 24 hours and -2.9 L since 12/22/19  UOP: 3 L x 24 hours  Attempted to speak with pt via phone, however, no answer.  Pt remains with good appetite. Noted meal completion 75-100%.   Per chart review, possible discharge today; palliative care also to meet with pt brother to further discuss goals of care.  Medications reviewed and include lasix  Labs reviewed: CBGS: [id number] (inpatient orders for glycemic control are 0-20 units insulin aspart TID with meals and 8 units insulin glargine daily).   Diet Order:   Diet Order            Diet Carb Modified Fluid consistency: Thin; Room service appropriate? Yes  Diet effective now              EDUCATION NEEDS:   Not appropriate for education at this time  Skin:  Skin Assessment: Skin Integrity Issues: Skin Integrity Issues:: Stage IV Stage IV: buttocks with tunneling  Last BM:  01/04/20  Height:   Ht Readings  from Last 1 Encounters:  12/20/19 5\' 4"  (1.626 m)    Weight:   Wt Readings from Last 1 Encounters:  01/05/20 96.2 kg    Ideal Body Weight:  54.5 kg  BMI:  Body mass index is 36.39 kg/m.  Estimated Nutritional Needs:   Kcal:  1950-2150  Protein:  110-125 grams  Fluid:  > 1.9 L    01/07/20, RD, LDN, CDCES Registered Dietitian II Certified Diabetes Care and Education Specialist Please refer to [facility] for RD and/or RD on-call/weekend/after hours pager

## 2020-01-05 NOTE — Care Management Important Message (Signed)
Important Message  Patient Details  Name: Becky Hendricks MRN: [id number] Date of Birth: March 14, 1946   Medicare Important Message Given:  Yes     Renie Ora 01/05/2020, 11:52 AM

## 2020-01-06 LAB — GLUCOSE, CAPILLARY
Glucose-Capillary: 135 mg/dL — ABNORMAL HIGH (ref 70–99)
Glucose-Capillary: 268 mg/dL — ABNORMAL HIGH (ref 70–99)
Glucose-Capillary: 294 mg/dL — ABNORMAL HIGH (ref 70–99)

## 2020-01-06 LAB — BASIC METABOLIC PANEL
Anion gap: 14 (ref 5–15)
BUN: 12 mg/dL (ref 8–23)
CO2: 35 mmol/L — ABNORMAL HIGH (ref 22–32)
Calcium: 8.7 mg/dL — ABNORMAL LOW (ref 8.9–10.3)
Chloride: 93 mmol/L — ABNORMAL LOW (ref 98–111)
Creatinine, Ser: 0.39 mg/dL — ABNORMAL LOW (ref 0.44–1.00)
GFR calc Af Amer: >60 mL/min (ref >60)
GFR calc non Af Amer: >60 mL/min (ref >60)
Glucose, Bld: 146 mg/dL — ABNORMAL HIGH (ref 70–99)
Potassium: 3.7 mmol/L (ref 3.5–5.1)
Sodium: 142 mmol/L (ref 135–145)

## 2020-01-06 MED ORDER — HEPARIN SOD (PORK) LOCK FLUSH 100 UNIT/ML IV SOLN
250.0000 [IU] | INTRAVENOUS | Status: AC | PRN
  Administered 2020-01-06: 250 [IU]
  Filled 2020-01-06: qty 2.5

## 2020-01-06 NOTE — Discharge Instructions (Signed)

## 2020-01-06 NOTE — TOC Transition Note (Signed)
Transition of Care [facility]) - CM/SW Discharge Note   Patient Details  Name: Becky Hendricks MRN: [id number] Date of Birth: 1946-01-06  [facility] CM/SW Contact:  Jimmy Picket, LCSWA Phone Number: 01/06/2020, 1:17 PM   Clinical Narrative:     Pt is discharging to [facility] via [facility]. Pts brother [name] has been notified.   Nurse can call report to [phone].   Final next level of care: Skilled Nursing Facility Barriers to Discharge: Barriers Resolved   Patient Goals and CMS Choice   CMS Medicare.gov Compare Post Acute Care list provided to:: Patient Represenative (must comment)(brother [name]) Choice offered to / list presented to : Sibling  Discharge Placement                    Patient and family notified of of transfer: 01/06/20  Discharge Plan and Services     Post Acute Care Choice: Skilled Nursing Facility                               Social Determinants of Health (SDOH) Interventions     Readmission Risk Interventions No flowsheet data found.   Jimmy Picket, Theresia Majors, [place] Clinical Social Worker [phone]

## 2020-01-06 NOTE — Progress Notes (Signed)
Report called to [facility] at [phone], spoke with nurse named Gweneth Dimitri.  Pt will be going to room 908, [place].   Pt will travel via [place], waiting transport arrival.

## 2020-01-06 NOTE — Social Work (Signed)
Pts authorization number is [id number] and it is active for dates 5/25-5/27.   Jimmy Picket, Theresia Majors, [place] Clinical Social Worker [phone]

## 2020-01-19 ENCOUNTER — Encounter (HOSPITAL_COMMUNITY): Payer: Self-pay | Admitting: *Deleted

## 2020-01-19 ENCOUNTER — Emergency Department (HOSPITAL_COMMUNITY): Payer: Medicare Other

## 2020-01-19 ENCOUNTER — Inpatient Hospital Stay (HOSPITAL_COMMUNITY)
Admission: EM | Admit: 2020-01-19 | Discharge: 2020-01-27 | DRG: 291 | Disposition: A | Discharge status: Skilled Nursing Facility | Payer: Medicare Other | Source: Skilled Nursing Facility | Attending: Internal Medicine | Admitting: Internal Medicine

## 2020-01-19 ENCOUNTER — Inpatient Hospital Stay (HOSPITAL_COMMUNITY): Payer: Medicare Other

## 2020-01-19 DIAGNOSIS — J9621 Acute and chronic respiratory failure with hypoxia: Secondary | ICD-10-CM | POA: Diagnosis present

## 2020-01-19 DIAGNOSIS — Z4659 Encounter for fitting and adjustment of other gastrointestinal appliance and device: Secondary | ICD-10-CM

## 2020-01-19 DIAGNOSIS — B9562 Methicillin resistant Staphylococcus aureus infection as the cause of diseases classified elsewhere: Secondary | ICD-10-CM | POA: Diagnosis present

## 2020-01-19 DIAGNOSIS — J449 Chronic obstructive pulmonary disease, unspecified: Secondary | ICD-10-CM | POA: Diagnosis present

## 2020-01-19 DIAGNOSIS — I5032 Chronic diastolic (congestive) heart failure: Secondary | ICD-10-CM | POA: Diagnosis present

## 2020-01-19 DIAGNOSIS — Z9981 Dependence on supplemental oxygen: Secondary | ICD-10-CM

## 2020-01-19 DIAGNOSIS — Z79899 Other long term (current) drug therapy: Secondary | ICD-10-CM

## 2020-01-19 DIAGNOSIS — M4628 Osteomyelitis of vertebra, sacral and sacrococcygeal region: Secondary | ICD-10-CM | POA: Diagnosis present

## 2020-01-19 DIAGNOSIS — Z7951 Long term (current) use of inhaled steroids: Secondary | ICD-10-CM

## 2020-01-19 DIAGNOSIS — Z794 Long term (current) use of insulin: Secondary | ICD-10-CM | POA: Diagnosis not present

## 2020-01-19 DIAGNOSIS — M86159 Other acute osteomyelitis, unspecified femur: Secondary | ICD-10-CM | POA: Diagnosis present

## 2020-01-19 DIAGNOSIS — E785 Hyperlipidemia, unspecified: Secondary | ICD-10-CM | POA: Diagnosis present

## 2020-01-19 DIAGNOSIS — L89304 Pressure ulcer of unspecified buttock, stage 4: Secondary | ICD-10-CM | POA: Diagnosis present

## 2020-01-19 DIAGNOSIS — F319 Bipolar disorder, unspecified: Secondary | ICD-10-CM | POA: Diagnosis present

## 2020-01-19 DIAGNOSIS — G40909 Epilepsy, unspecified, not intractable, without status epilepticus: Secondary | ICD-10-CM | POA: Diagnosis present

## 2020-01-19 DIAGNOSIS — E1165 Type 2 diabetes mellitus with hyperglycemia: Secondary | ICD-10-CM | POA: Diagnosis present

## 2020-01-19 DIAGNOSIS — Z20822 Contact with and (suspected) exposure to covid-19: Secondary | ICD-10-CM | POA: Diagnosis present

## 2020-01-19 DIAGNOSIS — J810 Acute pulmonary edema: Secondary | ICD-10-CM | POA: Diagnosis not present

## 2020-01-19 DIAGNOSIS — Z7901 Long term (current) use of anticoagulants: Secondary | ICD-10-CM | POA: Diagnosis not present

## 2020-01-19 DIAGNOSIS — D6489 Other specified anemias: Secondary | ICD-10-CM | POA: Diagnosis present

## 2020-01-19 DIAGNOSIS — A419 Sepsis, unspecified organism: Secondary | ICD-10-CM

## 2020-01-19 DIAGNOSIS — E876 Hypokalemia: Secondary | ICD-10-CM | POA: Diagnosis present

## 2020-01-19 DIAGNOSIS — E872 Acidosis: Secondary | ICD-10-CM | POA: Diagnosis present

## 2020-01-19 DIAGNOSIS — Z888 Allergy status to other drugs, medicaments and biological substances status: Secondary | ICD-10-CM | POA: Diagnosis not present

## 2020-01-19 DIAGNOSIS — R509 Fever, unspecified: Secondary | ICD-10-CM | POA: Diagnosis not present

## 2020-01-19 DIAGNOSIS — I110 Hypertensive heart disease with heart failure: Secondary | ICD-10-CM | POA: Diagnosis present

## 2020-01-19 DIAGNOSIS — I5033 Acute on chronic diastolic (congestive) heart failure: Secondary | ICD-10-CM | POA: Diagnosis present

## 2020-01-19 DIAGNOSIS — I480 Paroxysmal atrial fibrillation: Secondary | ICD-10-CM | POA: Diagnosis present

## 2020-01-19 DIAGNOSIS — E1169 Type 2 diabetes mellitus with other specified complication: Secondary | ICD-10-CM | POA: Diagnosis present

## 2020-01-19 DIAGNOSIS — J9601 Acute respiratory failure with hypoxia: Secondary | ICD-10-CM | POA: Diagnosis present

## 2020-01-19 DIAGNOSIS — J9622 Acute and chronic respiratory failure with hypercapnia: Secondary | ICD-10-CM | POA: Diagnosis present

## 2020-01-19 DIAGNOSIS — Z8674 Personal history of sudden cardiac arrest: Secondary | ICD-10-CM

## 2020-01-19 DIAGNOSIS — G9341 Metabolic encephalopathy: Secondary | ICD-10-CM | POA: Diagnosis present

## 2020-01-19 DIAGNOSIS — I2722 Pulmonary hypertension due to left heart disease: Secondary | ICD-10-CM | POA: Diagnosis present

## 2020-01-19 DIAGNOSIS — E1151 Type 2 diabetes mellitus with diabetic peripheral angiopathy without gangrene: Secondary | ICD-10-CM | POA: Diagnosis present

## 2020-01-19 LAB — URINALYSIS, ROUTINE W REFLEX MICROSCOPIC
Bilirubin Urine: NEGATIVE
Glucose, UA: NEGATIVE mg/dL
Ketones, ur: NEGATIVE mg/dL
Nitrite: NEGATIVE
Protein, ur: NEGATIVE mg/dL
RBC / HPF: >50 RBC/hpf — ABNORMAL HIGH (ref 0–5)
Specific Gravity, Urine: 1.009 (ref 1.005–1.030)
WBC, UA: >50 WBC/hpf — ABNORMAL HIGH (ref 0–5)
pH: 5 (ref 5.0–8.0)

## 2020-01-19 LAB — CBC WITH DIFFERENTIAL/PLATELET
Abs Immature Granulocytes: 0.2 10*3/uL — ABNORMAL HIGH (ref 0.00–0.07)
Basophils Absolute: 0.1 10*3/uL (ref 0.0–0.1)
Basophils Relative: 1 %
Eosinophils Absolute: 0.1 10*3/uL (ref 0.0–0.5)
Eosinophils Relative: 1 %
HCT: 38 % (ref 36.0–46.0)
Hemoglobin: 10.7 g/dL — ABNORMAL LOW (ref 12.0–15.0)
Immature Granulocytes: 2 %
Lymphocytes Relative: 11 %
Lymphs Abs: 1.3 10*3/uL (ref 0.7–4.0)
MCH: 30 pg (ref 26.0–34.0)
MCHC: 28.2 g/dL — ABNORMAL LOW (ref 30.0–36.0)
MCV: 106.4 fL — ABNORMAL HIGH (ref 80.0–100.0)
Monocytes Absolute: 0.7 10*3/uL (ref 0.1–1.0)
Monocytes Relative: 6 %
Neutro Abs: 8.8 10*3/uL — ABNORMAL HIGH (ref 1.7–7.7)
Neutrophils Relative %: 79 %
Platelets: 184 10*3/uL (ref 150–400)
RBC: 3.57 MIL/uL — ABNORMAL LOW (ref 3.87–5.11)
RDW: 16.9 % — ABNORMAL HIGH (ref 11.5–15.5)
WBC: 11.1 10*3/uL — ABNORMAL HIGH (ref 4.0–10.5)
nRBC: 0 % (ref 0.0–0.2)

## 2020-01-19 LAB — BLOOD GAS, ARTERIAL
Acid-Base Excess: 21 mmol/L — ABNORMAL HIGH (ref 0.0–2.0)
Bicarbonate: 48.7 mmol/L — ABNORMAL HIGH (ref 20.0–28.0)
O2 Saturation: 95.6 %
O2 Saturation: >100 %
Patient temperature: 98.6
Patient temperature: 98.6
pCO2 arterial: >120 mmHg (ref 32.0–48.0)
pCO2 arterial: 80.8 mmHg (ref 32.0–48.0)
pH, Arterial: 7.241 — ABNORMAL LOW (ref 7.350–7.450)
pH, Arterial: 7.397 (ref 7.350–7.450)
pO2, Arterial: 92.7 mmHg (ref 83.0–108.0)
pO2, Arterial: 279 mmHg — ABNORMAL HIGH (ref 83.0–108.0)

## 2020-01-19 LAB — VANCOMYCIN, RANDOM: Vancomycin Rm: 12

## 2020-01-19 LAB — APTT: aPTT: 29 seconds (ref 24–36)

## 2020-01-19 LAB — COMPREHENSIVE METABOLIC PANEL
ALT: 12 U/L (ref 0–44)
AST: 10 U/L — ABNORMAL LOW (ref 15–41)
Albumin: 2.8 g/dL — ABNORMAL LOW (ref 3.5–5.0)
Alkaline Phosphatase: 59 U/L (ref 38–126)
Anion gap: 17 — ABNORMAL HIGH (ref 5–15)
BUN: 13 mg/dL (ref 8–23)
CO2: 50 mmol/L — ABNORMAL HIGH (ref 22–32)
Calcium: 8.3 mg/dL — ABNORMAL LOW (ref 8.9–10.3)
Chloride: 82 mmol/L — ABNORMAL LOW (ref 98–111)
Creatinine, Ser: 0.43 mg/dL — ABNORMAL LOW (ref 0.44–1.00)
GFR calc Af Amer: >60 mL/min (ref >60)
GFR calc non Af Amer: >60 mL/min (ref >60)
Glucose, Bld: 245 mg/dL — ABNORMAL HIGH (ref 70–99)
Potassium: 3.8 mmol/L (ref 3.5–5.1)
Sodium: 149 mmol/L — ABNORMAL HIGH (ref 135–145)
Total Bilirubin: 0.6 mg/dL (ref 0.3–1.2)
Total Protein: 6.4 g/dL — ABNORMAL LOW (ref 6.5–8.1)

## 2020-01-19 LAB — SARS CORONAVIRUS 2 BY RT PCR (HOSPITAL ORDER, PERFORMED IN ~~LOC~~ HOSPITAL LAB): SARS Coronavirus 2: NEGATIVE

## 2020-01-19 LAB — PROTIME-INR
INR: 1.3 — ABNORMAL HIGH (ref 0.8–1.2)
Prothrombin Time: 15.4 seconds — ABNORMAL HIGH (ref 11.4–15.2)

## 2020-01-19 LAB — BRAIN NATRIURETIC PEPTIDE: B Natriuretic Peptide: 728.1 pg/mL — ABNORMAL HIGH (ref 0.0–100.0)

## 2020-01-19 LAB — LACTIC ACID, PLASMA
Lactic Acid, Venous: 2.7 mmol/L (ref 0.5–1.9)
Lactic Acid, Venous: 3.5 mmol/L (ref 0.5–1.9)

## 2020-01-19 LAB — MRSA PCR SCREENING: MRSA by PCR: NEGATIVE

## 2020-01-19 LAB — GLUCOSE, CAPILLARY
Glucose-Capillary: 173 mg/dL — ABNORMAL HIGH (ref 70–99)
Glucose-Capillary: 139 mg/dL — ABNORMAL HIGH (ref 70–99)
Glucose-Capillary: 182 mg/dL — ABNORMAL HIGH (ref 70–99)

## 2020-01-19 LAB — PHOSPHORUS: Phosphorus: 4.6 mg/dL (ref 2.5–4.6)

## 2020-01-19 LAB — MAGNESIUM: Magnesium: 1.5 mg/dL — ABNORMAL LOW (ref 1.7–2.4)

## 2020-01-19 LAB — TRIGLYCERIDES: Triglycerides: 222 mg/dL — ABNORMAL HIGH (ref <150)

## 2020-01-19 LAB — VALPROIC ACID LEVEL: Valproic Acid Lvl: 29 ug/mL — ABNORMAL LOW (ref 50.0–100.0)

## 2020-01-19 LAB — CBG MONITORING, ED: Glucose-Capillary: 216 mg/dL — ABNORMAL HIGH (ref 70–99)

## 2020-01-19 IMAGING — DX DG CHEST 1V PORT
1 series · 1 of 1 positions shown · non-contrast
Comparison: [DATE]

CLINICAL DATA: Shortness of breath with altered mental status

EXAM:
PORTABLE CHEST 1 VIEW

[chest ap]
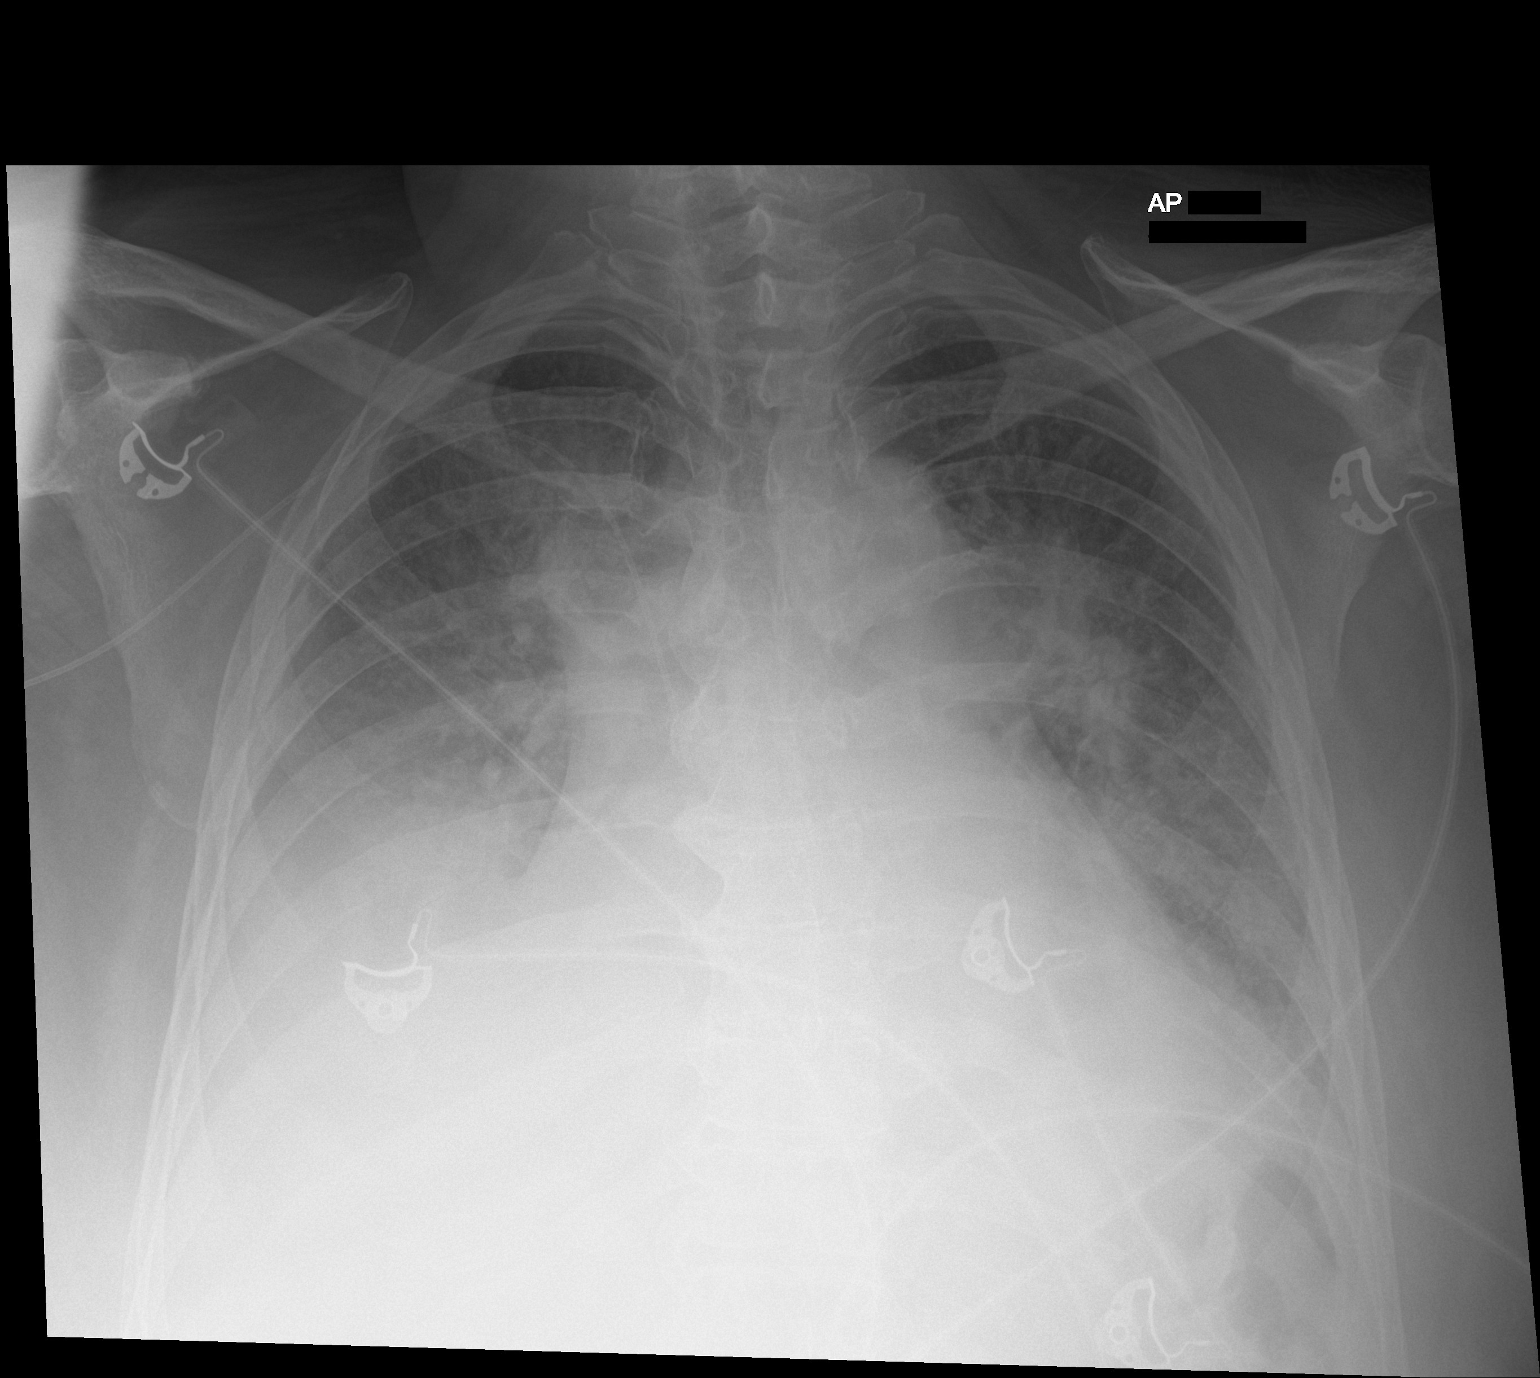

[1 of 1 positions shown; findings below may reference images not displayed]

FINDINGS: There is cardiomegaly with pulmonary venous hypertension. There are
pleural effusions bilaterally, larger on the right than the left.
There is perihilar interstitial prominence, likely edema. There is
ill-defined airspace opacity in the right base. No appreciable
adenopathy. Central catheter tip is in the superior vena cava. No
pneumothorax. No bone lesions.
IMPRESSION: There is cardiomegaly with pulmonary vascular congestion. Right
pleural effusion with mild perihilar interstitial edema. Airspace
opacity in the right base may represent alveolar edema or pneumonia.
The overall appearance is concerning for a degree of congestive
heart failure, potentially with pneumonia in the right base.

Central catheter tip in superior vena cava without pneumothorax.

## 2020-01-19 IMAGING — DX DG CHEST 1V PORT
1 series · 1 of 1 positions shown · non-contrast
Comparison: [DATE] [DATE], [DATE] ([DATE] p.m.)

CLINICAL DATA: Status post intubation.

EXAM:
PORTABLE CHEST 1 VIEW

[chest ap]
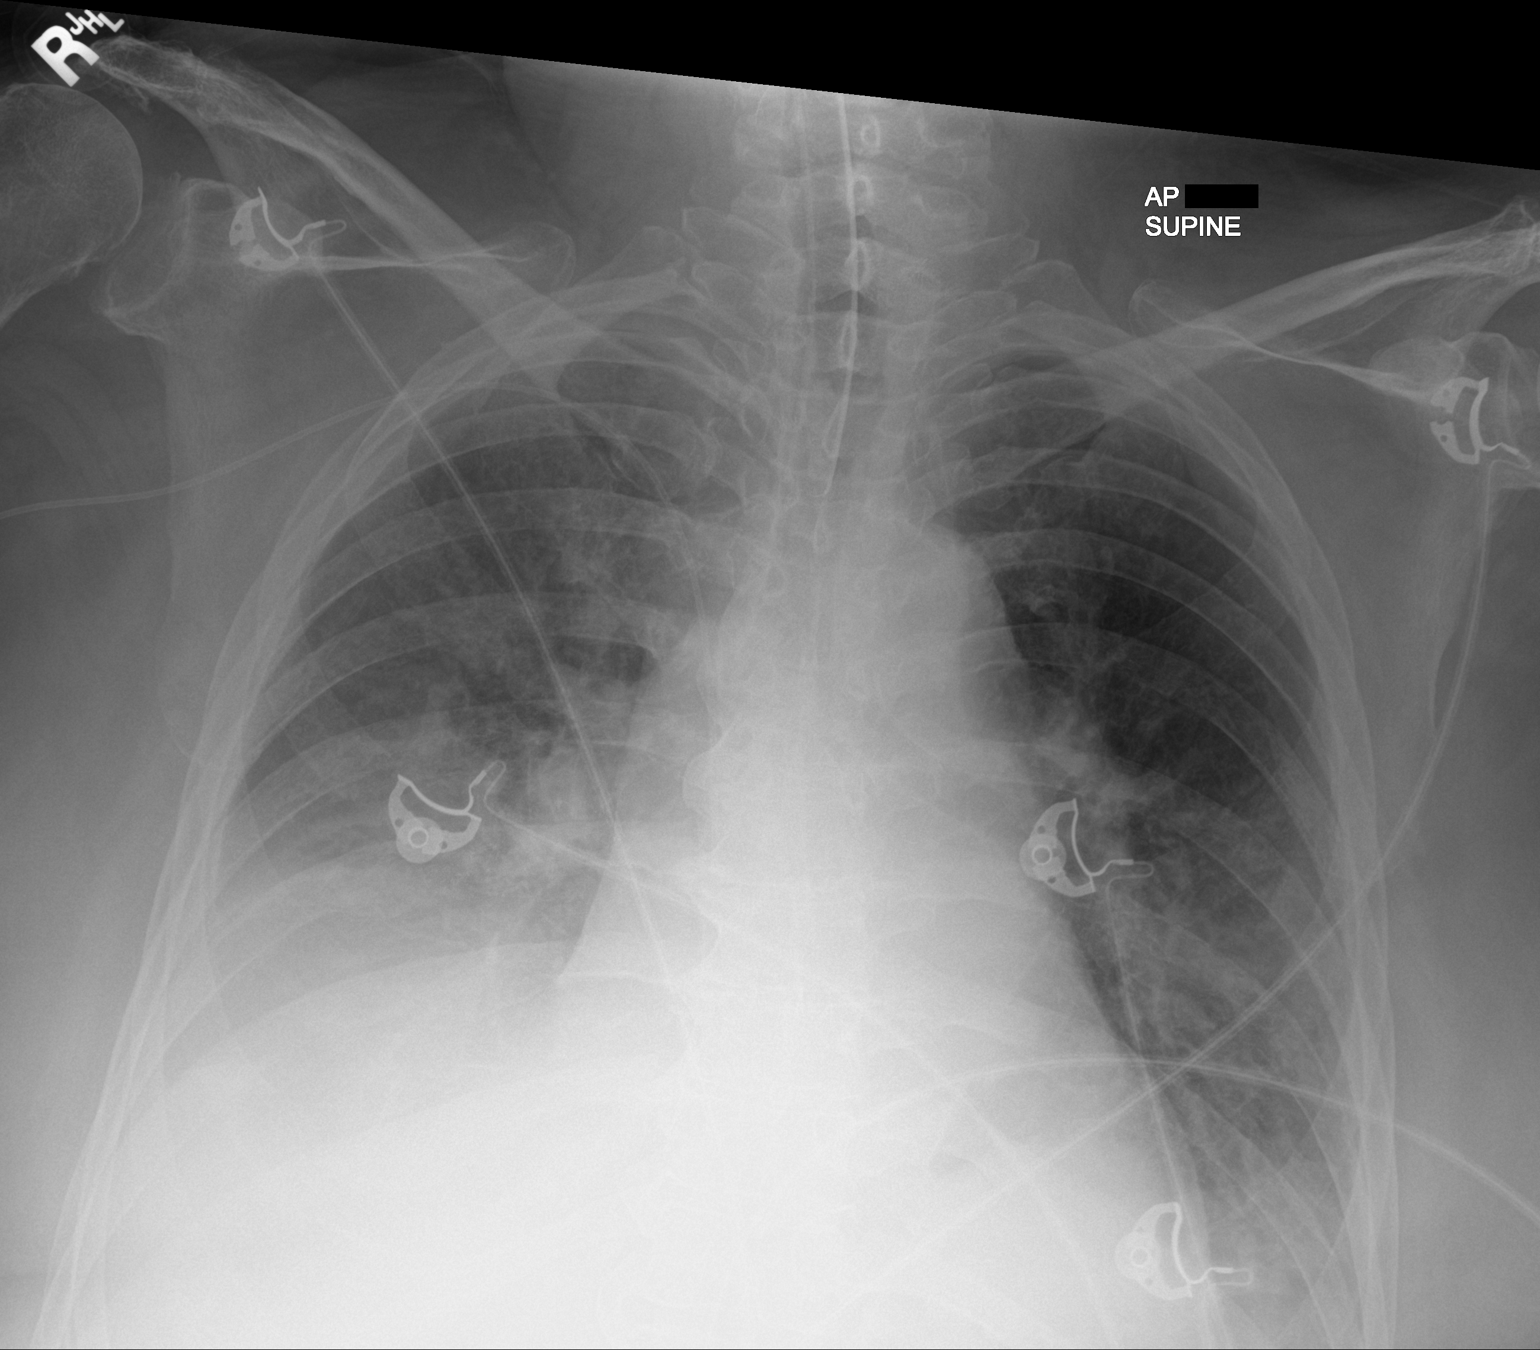

[1 of 1 positions shown; findings below may reference images not displayed]

FINDINGS: An endotracheal tube is seen with its distal tip approximately
cm from the carina. There is stable right-sided PICC line
positioning. Very mild pulmonary vascular congestion is noted which
is decreased in severity when compared to the prior study. Mild,
stable bibasilar atelectasis and/or infiltrate is seen. A small
right pleural effusion is noted. No pneumothorax is identified. The
heart size and mediastinal contours are within normal limits.
Degenerative changes seen throughout the thoracic spine.
IMPRESSION: 1. Endotracheal tube, with its distal tip approximately 4.3 cm from
the carina.
2. Very mild pulmonary vascular congestion which is decreased in
severity when compared to the prior study.

## 2020-01-19 IMAGING — CT CT HEAD W/O CM
3 series · 15 of 47 positions shown, 18 images · non-contrast
Comparison: [DATE]

CLINICAL DATA: Altered mental status

EXAM:
CT HEAD WITHOUT CONTRAST
TECHNIQUE: Contiguous axial images were obtained from the base of the skull
through the vertex without intravenous contrast.

[Series 2: head wo · axial · 0.49mm/px · z∈[-165,-30]mm · 9 of 33 slices shown, 12 images]
[im 3/33  brain]
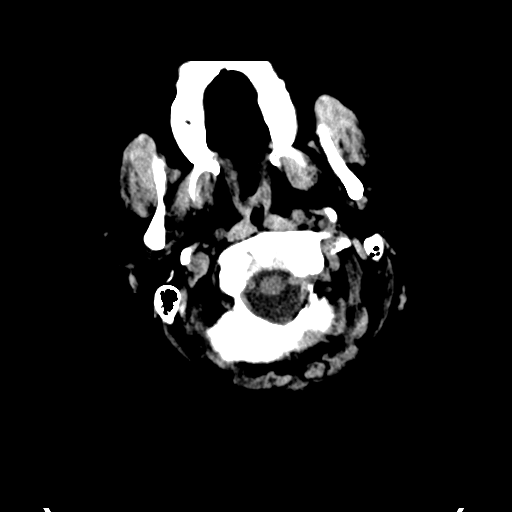
[im 3/33  bone]
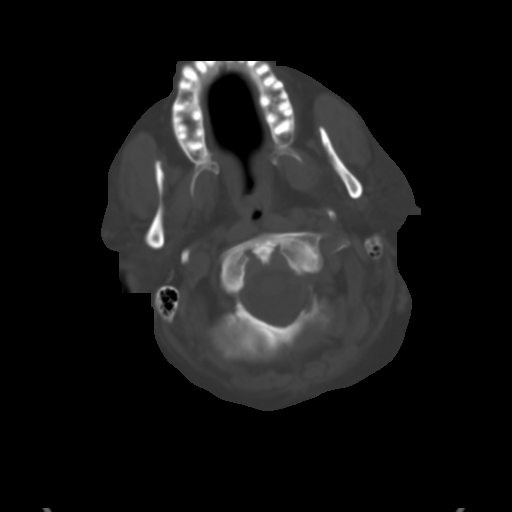
[im 6/33  brain]
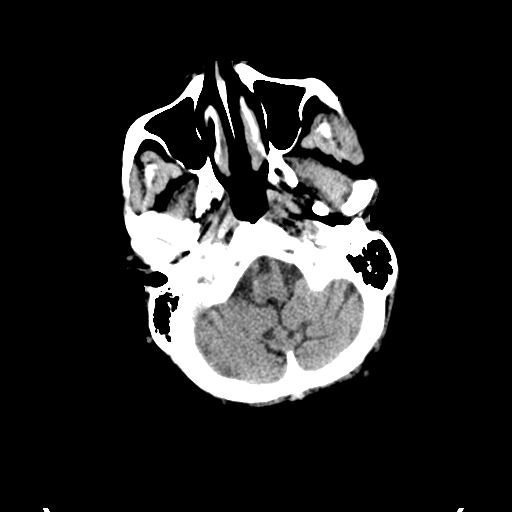
[im 9/33  brain]
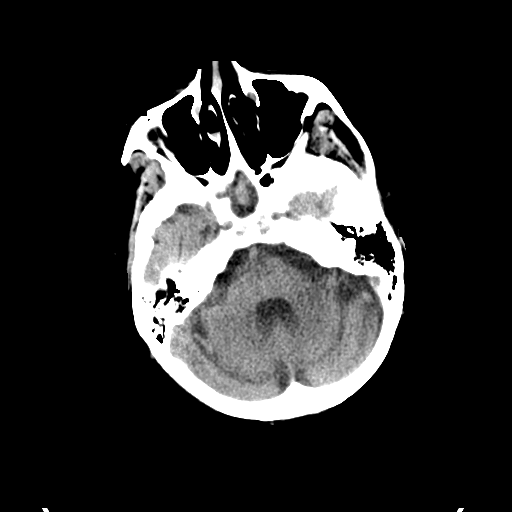
[im 13/33  brain]
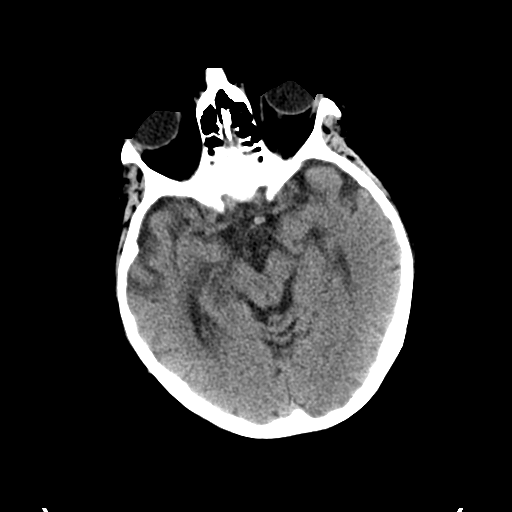
[im 17/33  brain]
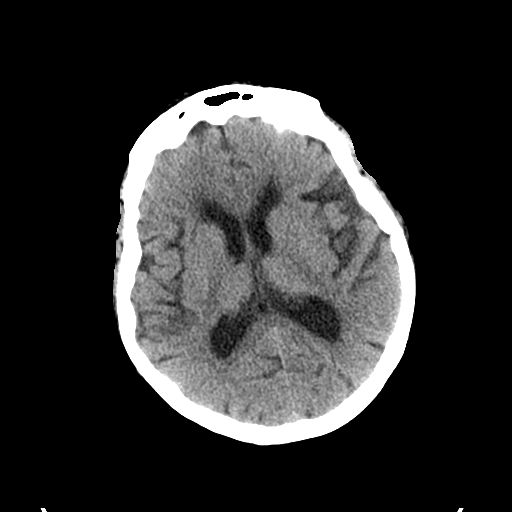
[im 17/33  bone]
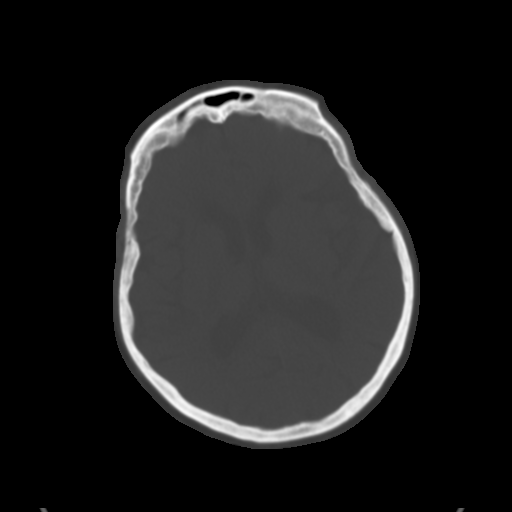
[im 20/33  brain]
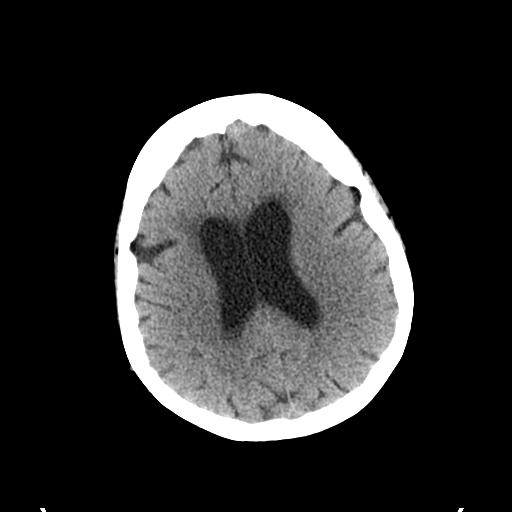
[im 24/33  brain]
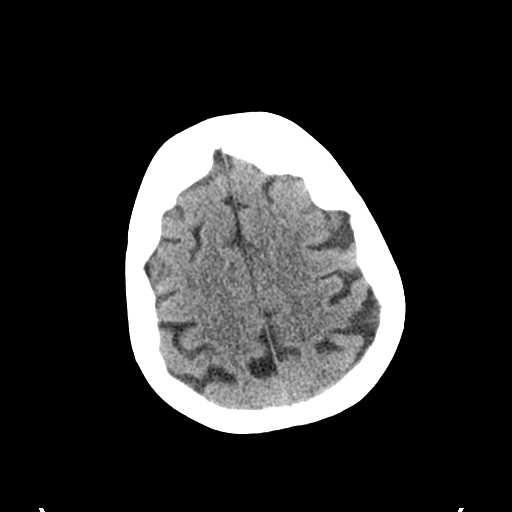
[im 27/33  brain]
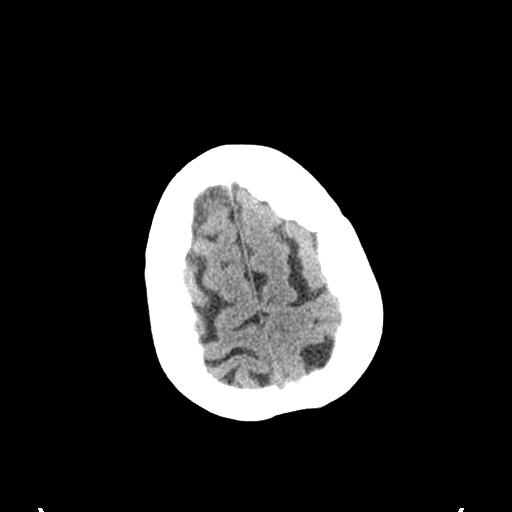
[im 30/33  brain]
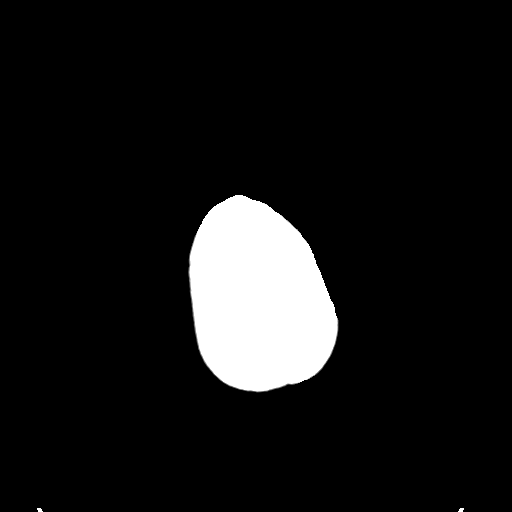
[im 30/33  bone]
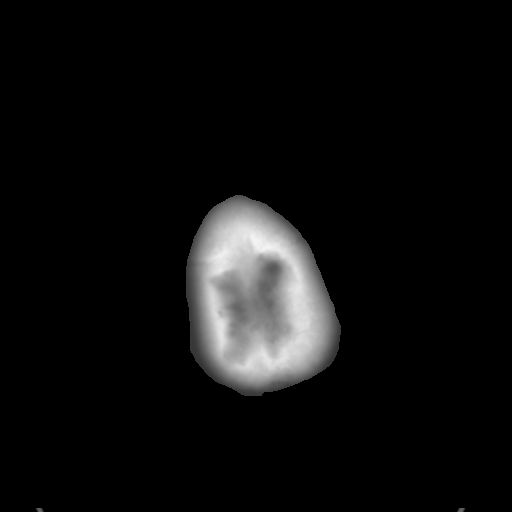

[Series 4: coronal soft tissue · coronal · 0.32mm/px · 3 of 72 slices shown]
[im 24/72  brain]
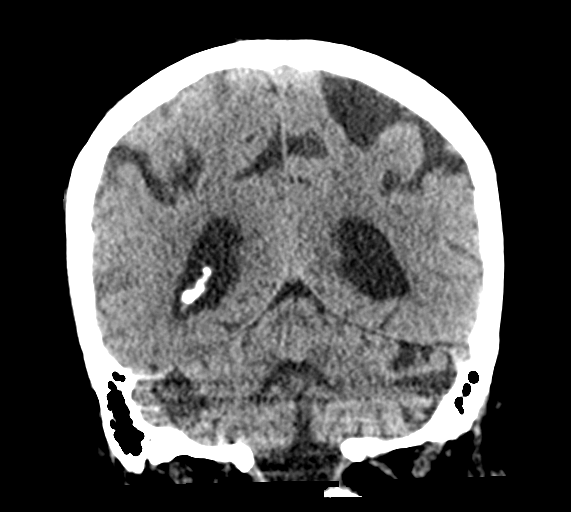
[im 32/72  brain]
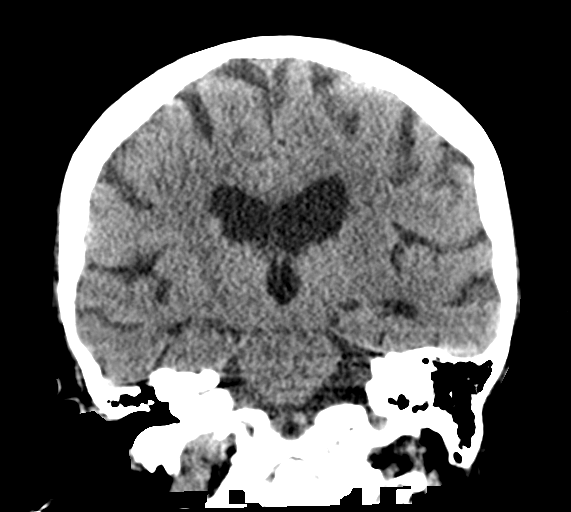
[im 40/72  brain]
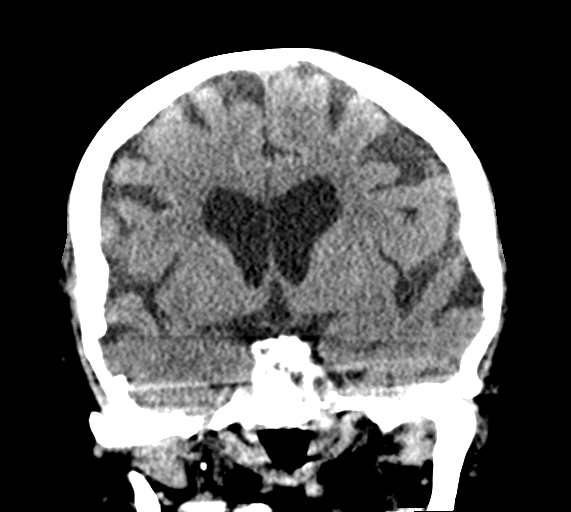

[Series 5: sagittal soft tissue · sagittal · 0.33mm/px · 3 of 55 slices shown]
[im 19/55  brain]
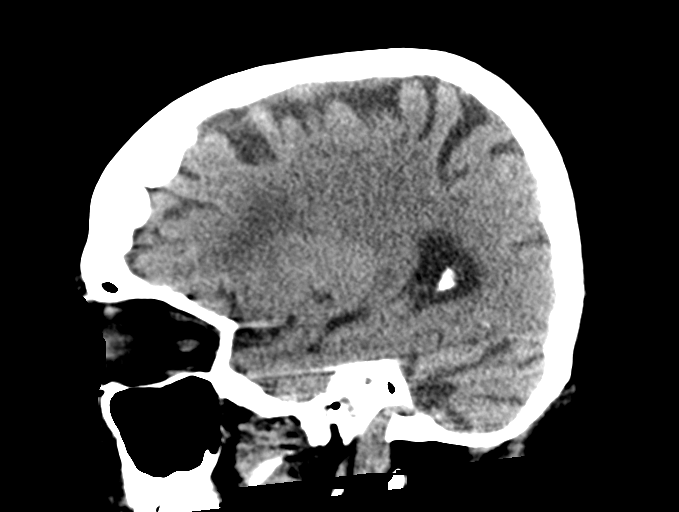
[im 28/55  brain]
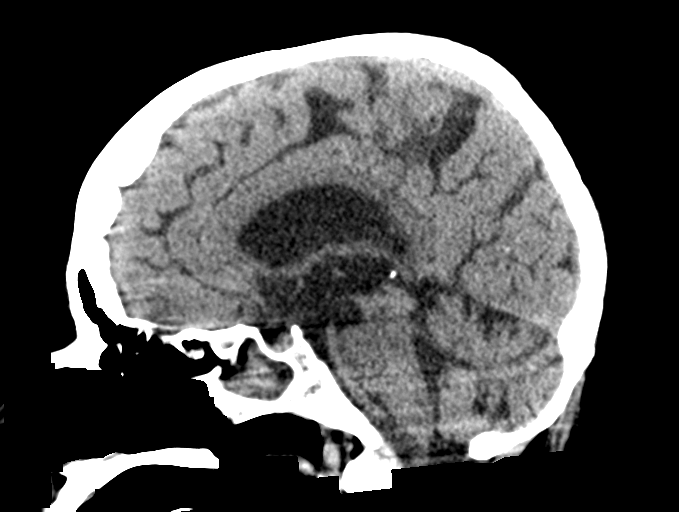
[im 37/55  brain]
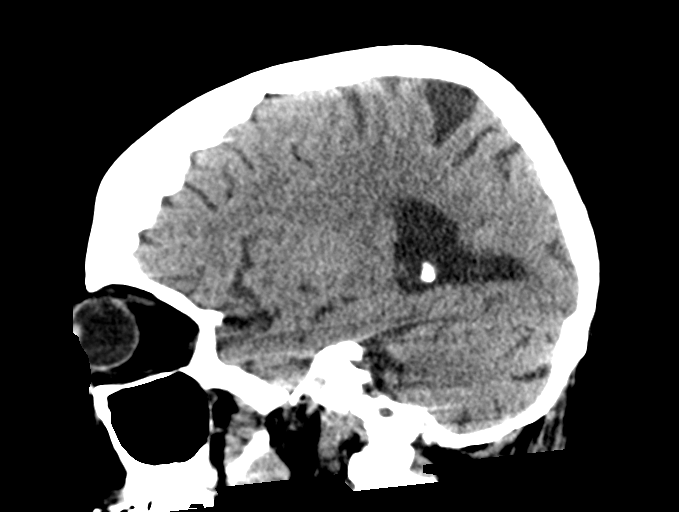

[15 of 47 positions shown; findings below may reference images not displayed]

FINDINGS: Brain: There is mild diffuse atrophy. There is no intracranial mass,
hemorrhage, extra-axial fluid collection, or midline shift. There is
patchy small vessel disease in the centra semiovale bilaterally. No
acute appearing infarct evident.

Vascular: No hyperdense vessel. No appreciable vascular
calcification.

Skull: The bony calvarium appears intact.

Sinuses/Orbits: There is mucosal thickening in several ethmoid air
cells. Other paranasal sinuses are clear. Orbits appear symmetric
bilaterally.

Other: Mastoid air cells are clear.
IMPRESSION: Stable atrophy with periventricular small vessel disease. No acute
infarct evident. No mass or hemorrhage.

Mucosal thickening noted in several ethmoid air cells.

## 2020-01-19 IMAGING — DX DG ABDOMEN 1V
1 series · 1 of 1 positions shown · non-contrast
Comparison: [DATE]

CLINICAL DATA: Intubated, enteric catheter placement

EXAM:
ABDOMEN - 1 VIEW

[abdomen kub]
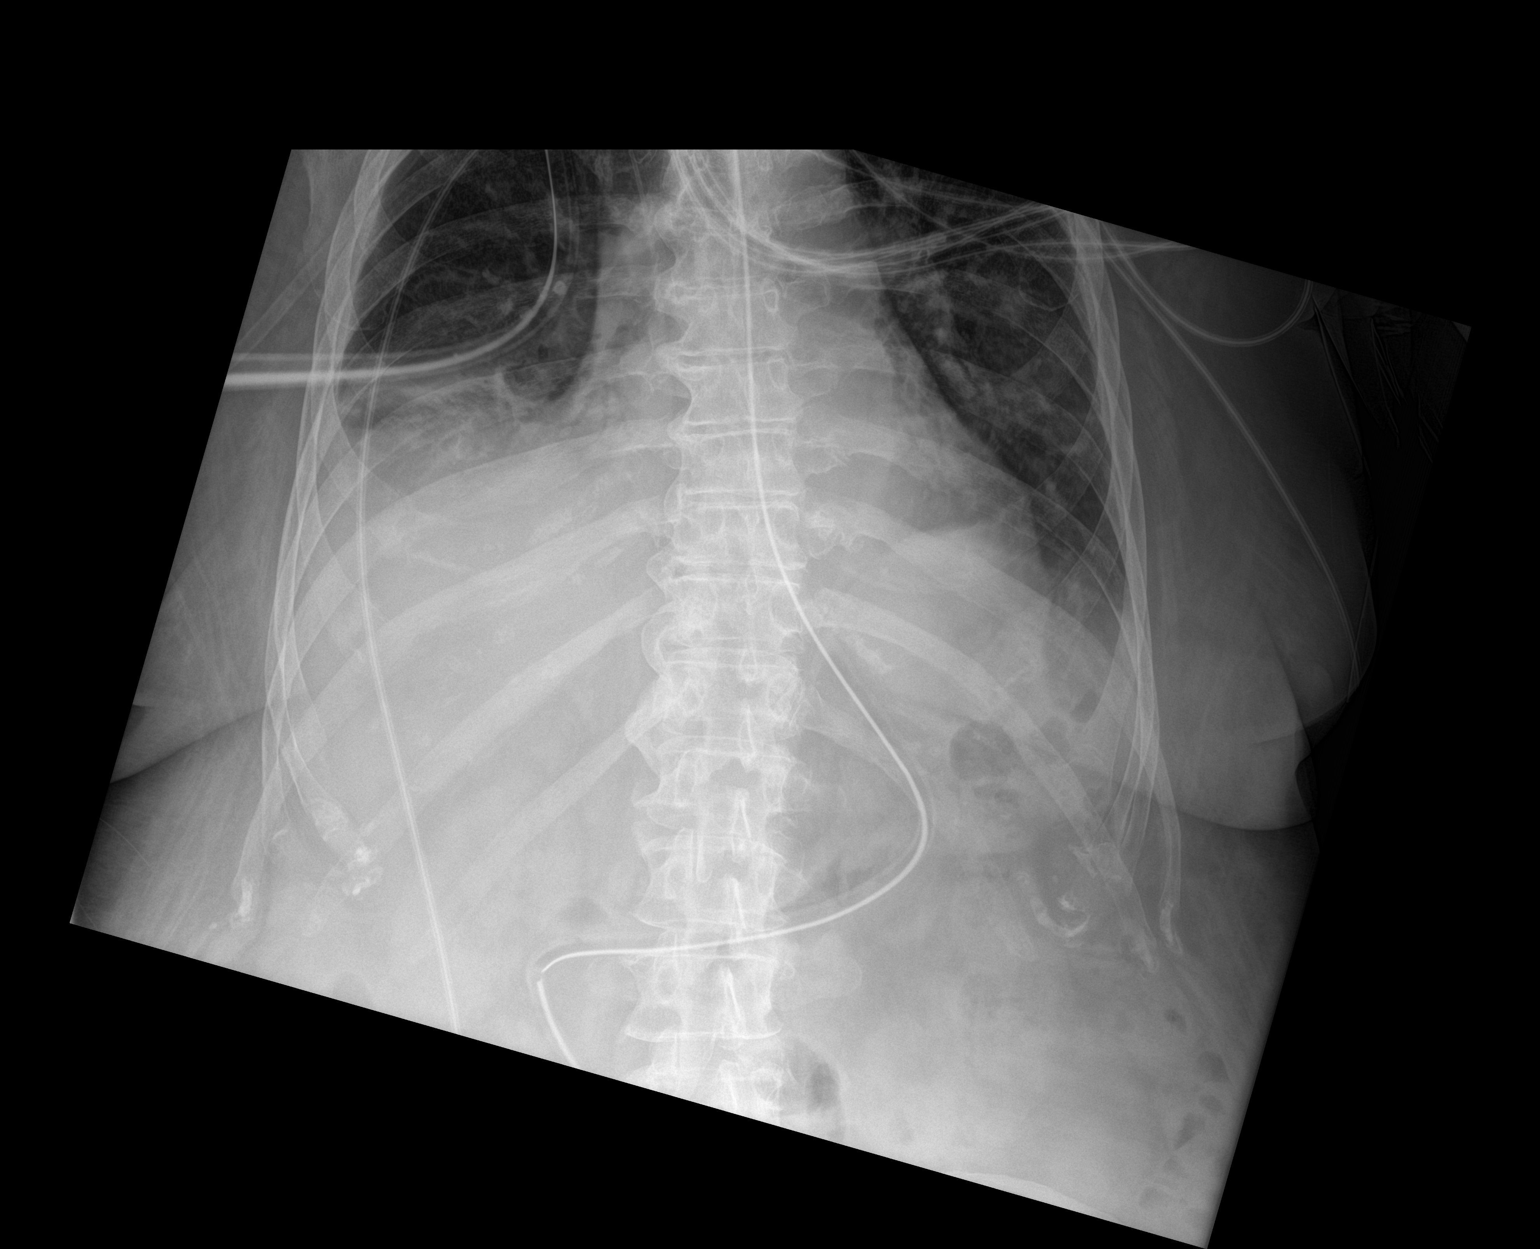

[1 of 1 positions shown; findings below may reference images not displayed]

FINDINGS: Frontal view of the lower chest and upper abdomen demonstrates an
enteric catheter along the expected course of the stomach. Side port
projects over the gastric antrum/pylorus. Distal tip is excluded by
collimation, likely within the proximal duodenum.

Bowel gas pattern is unremarkable. Patchy right basilar
consolidation and/or effusion is noted. Right-sided PICC projects
over the superior vena cava.
IMPRESSION: 1. Enteric catheter as above, side port projecting over gastric
pylorus. Tip is excluded, likely within the proximal duodenum.

## 2020-01-19 MED ORDER — DIVALPROEX SODIUM 500 MG PO DR TAB
500.0000 mg | DELAYED_RELEASE_TABLET | Freq: Two times a day (BID) | ORAL | Status: DC
  Administered 2020-01-19: 500 mg via ORAL
  Filled 2020-01-19 (×2): qty 1

## 2020-01-19 MED ORDER — DEXMEDETOMIDINE HCL IN NACL 200 MCG/50ML IV SOLN
0.0000 ug/kg/h | INTRAVENOUS | Status: DC
  Administered 2020-01-19: 0.4 ug/kg/h via INTRAVENOUS
  Administered 2020-01-19 – 2020-01-20 (×3): 0.8 ug/kg/h via INTRAVENOUS
  Administered 2020-01-20: 0.7 ug/kg/h via INTRAVENOUS
  Filled 2020-01-19 (×4): qty 50

## 2020-01-19 MED ORDER — IPRATROPIUM-ALBUTEROL 0.5-2.5 (3) MG/3ML IN SOLN
3.0000 mL | Freq: Four times a day (QID) | RESPIRATORY_TRACT | Status: DC
  Administered 2020-01-19 – 2020-01-20 (×3): 3 mL via RESPIRATORY_TRACT
  Filled 2020-01-19 (×3): qty 3

## 2020-01-19 MED ORDER — POLYETHYLENE GLYCOL 3350 17 G PO PACK: 17.0000 g | PACK | Freq: Every day | ORAL | Status: DC

## 2020-01-19 MED ORDER — FENTANYL BOLUS VIA INFUSION
50.0000 ug | INTRAVENOUS | Status: DC | PRN
  Filled 2020-01-19: qty 50

## 2020-01-19 MED ORDER — FENTANYL CITRATE (PF) 100 MCG/2ML IJ SOLN: 50.0000 ug | Freq: Once | INTRAMUSCULAR | Status: DC

## 2020-01-19 MED ORDER — INSULIN GLARGINE 100 UNIT/ML ~~LOC~~ SOLN
15.0000 [IU] | Freq: Every day | SUBCUTANEOUS | Status: DC
  Administered 2020-01-19 – 2020-01-20 (×2): 15 [IU] via SUBCUTANEOUS
  Filled 2020-01-19 (×2): qty 0.15

## 2020-01-19 MED ORDER — DOCUSATE SODIUM 50 MG/5ML PO LIQD
100.0000 mg | Freq: Two times a day (BID) | ORAL | Status: DC
  Administered 2020-01-19: 100 mg via ORAL
  Filled 2020-01-19: qty 10

## 2020-01-19 MED ORDER — FENTANYL 2500MCG IN NS 250ML (10MCG/ML) PREMIX INFUSION
25.0000 ug/h | INTRAVENOUS | Status: DC
  Filled 2020-01-19: qty 250

## 2020-01-19 MED ORDER — LACTATED RINGERS IV BOLUS (SEPSIS): 250.0000 mL | Freq: Once | INTRAVENOUS | Status: DC

## 2020-01-19 MED ORDER — CHLORHEXIDINE GLUCONATE 0.12% ORAL RINSE (MEDLINE KIT): 15.0000 mL | Freq: Two times a day (BID) | OROMUCOSAL | Status: DC

## 2020-01-19 MED ORDER — ETOMIDATE 2 MG/ML IV SOLN
INTRAVENOUS | Status: AC
  Filled 2020-01-19: qty 10

## 2020-01-19 MED ORDER — POLYETHYLENE GLYCOL 3350 17 G PO PACK: 17.0000 g | PACK | Freq: Every day | ORAL | Status: DC | PRN

## 2020-01-19 MED ORDER — SODIUM CHLORIDE 0.9 % IV BOLUS (SEPSIS)
1000.0000 mL | Freq: Once | INTRAVENOUS | Status: AC
  Administered 2020-01-19: 1000 mL via INTRAVENOUS

## 2020-01-19 MED ORDER — FAMOTIDINE IN NACL 20-0.9 MG/50ML-% IV SOLN
20.0000 mg | Freq: Two times a day (BID) | INTRAVENOUS | Status: DC
  Administered 2020-01-19: 20 mg via INTRAVENOUS
  Filled 2020-01-19: qty 50

## 2020-01-19 MED ORDER — SUCCINYLCHOLINE CHLORIDE 20 MG/ML IJ SOLN
INTRAMUSCULAR | Status: AC | PRN
  Administered 2020-01-19: 100 mg via INTRAVENOUS

## 2020-01-19 MED ORDER — ONDANSETRON HCL 4 MG/2ML IJ SOLN: 4.0000 mg | Freq: Four times a day (QID) | INTRAMUSCULAR | Status: DC | PRN

## 2020-01-19 MED ORDER — APIXABAN 5 MG PO TABS
5.0000 mg | ORAL_TABLET | Freq: Two times a day (BID) | ORAL | Status: DC
  Administered 2020-01-19: 5 mg via ORAL
  Filled 2020-01-19: qty 1

## 2020-01-19 MED ORDER — ETOMIDATE 2 MG/ML IV SOLN
INTRAVENOUS | Status: AC | PRN
  Administered 2020-01-19: 20 mg via INTRAVENOUS

## 2020-01-19 MED ORDER — PROPOFOL 10 MG/ML IV BOLUS
INTRAVENOUS | Status: AC
  Filled 2020-01-19: qty 20

## 2020-01-19 MED ORDER — VITAL HIGH PROTEIN PO LIQD
1000.0000 mL | ORAL | Status: DC
  Administered 2020-01-19: 1000 mL

## 2020-01-19 MED ORDER — SODIUM CHLORIDE 0.9 % IV SOLN
500.0000 mg | INTRAVENOUS | Status: DC
  Administered 2020-01-19: 500 mg via INTRAVENOUS
  Filled 2020-01-19: qty 500

## 2020-01-19 MED ORDER — MIDAZOLAM HCL 2 MG/2ML IJ SOLN: 1.0000 mg | INTRAMUSCULAR | Status: DC | PRN

## 2020-01-19 MED ORDER — ORAL CARE MOUTH RINSE
15.0000 mL | OROMUCOSAL | Status: DC
  Administered 2020-01-19 – 2020-01-20 (×6): 15 mL via OROMUCOSAL

## 2020-01-19 MED ORDER — SODIUM CHLORIDE 0.9 % IV SOLN
1000.0000 mL | INTRAVENOUS | Status: DC
  Administered 2020-01-19: 1000 mL via INTRAVENOUS

## 2020-01-19 MED ORDER — NALOXONE HCL 0.4 MG/ML IJ SOLN
0.4000 mg | Freq: Once | INTRAMUSCULAR | Status: AC
  Administered 2020-01-19: 0.4 mg via INTRAVENOUS

## 2020-01-19 MED ORDER — VANCOMYCIN HCL IN DEXTROSE 1-5 GM/200ML-% IV SOLN
1000.0000 mg | Freq: Two times a day (BID) | INTRAVENOUS | Status: AC
  Administered 2020-01-19 – 2020-01-21 (×5): 1000 mg via INTRAVENOUS
  Filled 2020-01-19 (×5): qty 200

## 2020-01-19 MED ORDER — LACTATED RINGERS IV BOLUS (SEPSIS): 1000.0000 mL | Freq: Once | INTRAVENOUS | Status: DC

## 2020-01-19 MED ORDER — PROPOFOL 1000 MG/100ML IV EMUL
0.0000 ug/kg/min | INTRAVENOUS | Status: DC
  Administered 2020-01-19: 5 ug/kg/min via INTRAVENOUS

## 2020-01-19 MED ORDER — SODIUM CHLORIDE 0.9 % IV SOLN
2.0000 g | INTRAVENOUS | Status: DC
  Administered 2020-01-19: 2 g via INTRAVENOUS
  Filled 2020-01-19: qty 20

## 2020-01-19 MED ORDER — PRO-STAT SUGAR FREE PO LIQD
30.0000 mL | Freq: Two times a day (BID) | ORAL | Status: DC
  Administered 2020-01-19 – 2020-01-20 (×2): 30 mL
  Filled 2020-01-19 (×2): qty 30

## 2020-01-19 MED ORDER — MIDAZOLAM HCL 2 MG/2ML IJ SOLN
1.0000 mg | INTRAMUSCULAR | Status: DC | PRN
  Administered 2020-01-19: 1 mg via INTRAVENOUS
  Filled 2020-01-19: qty 2

## 2020-01-19 MED ORDER — FENTANYL CITRATE (PF) 100 MCG/2ML IJ SOLN: 25.0000 ug | INTRAMUSCULAR | Status: DC | PRN

## 2020-01-19 MED ORDER — SODIUM CHLORIDE 0.9 % IV SOLN
1.0000 g | Freq: Three times a day (TID) | INTRAVENOUS | Status: DC
  Administered 2020-01-19 – 2020-01-20 (×3): 1 g via INTRAVENOUS
  Filled 2020-01-19 (×3): qty 1

## 2020-01-19 MED ORDER — SUCCINYLCHOLINE CHLORIDE 200 MG/10ML IV SOSY
PREFILLED_SYRINGE | INTRAVENOUS | Status: AC
  Filled 2020-01-19: qty 10

## 2020-01-19 MED ORDER — DOCUSATE SODIUM 100 MG PO CAPS: 100.0000 mg | ORAL_CAPSULE | Freq: Two times a day (BID) | ORAL | Status: DC | PRN

## 2020-01-19 MED ORDER — NALOXONE HCL 0.4 MG/ML IJ SOLN
INTRAMUSCULAR | Status: AC
  Filled 2020-01-19: qty 1

## 2020-01-19 MED ORDER — INSULIN ASPART 100 UNIT/ML ~~LOC~~ SOLN
0.0000 [IU] | SUBCUTANEOUS | Status: DC
  Administered 2020-01-19: 3 [IU] via SUBCUTANEOUS
  Administered 2020-01-19: 2 [IU] via SUBCUTANEOUS
  Administered 2020-01-20: 8 [IU] via SUBCUTANEOUS
  Administered 2020-01-20 (×2): 3 [IU] via SUBCUTANEOUS
  Administered 2020-01-20: 2 [IU] via SUBCUTANEOUS
  Administered 2020-01-20: 3 [IU] via SUBCUTANEOUS
  Administered 2020-01-20: 2 [IU] via SUBCUTANEOUS
  Administered 2020-01-21: 3 [IU] via SUBCUTANEOUS

## 2020-01-19 NOTE — ED Provider Notes (Signed)
[facility] DEPT Provider Note   CSN: [id number] Arrival date & time: 01/19/20  1221     History Chief Complaint  Patient presents with  . Altered Mental Status    Becky Hendricks is a 74 y.o. female.  74 year old female who presents for nursing home they have decreased level of consciousness.  Symptoms began almost 4 hours ago prior to arrival.  Her baseline is that she is alert and oriented x4.  Patient being currently treated for an infection with IV antibiotics.  She does have a history of A. fib and is on Eliquis.  No status changes happened acutely.  Was sent here by her facility for further management        Past Medical History:  Diagnosis Date  . Atrial fibrillation ([place])   . Bipolar disorder ([place])   . Clostridium difficile diarrhea 08/02/2019  . Dehydration   . Essential hypertension   . Morbid obesity ([place])   . Type 2 diabetes mellitus ([place])   . Weakness     Patient Active Problem List   Diagnosis Date Noted  . Drug rash   . Acute osteomyelitis of pelvic region and thigh ([place])   . Palliative care by specialist   . Goals of care, counseling/discussion   . Edema of upper extremity   . Hyperphosphatemia   . Cardiac arrest ([place])   . Encounter for central line placement   . Acute respiratory failure ([place])   . Sacral wound   . Hypoxia   . Severe sepsis ([place]) 12/03/2019  . Osteomyelitis ([place]) 12/03/2019  . Abscess of multiple sites of buttock 12/03/2019  . AF (paroxysmal atrial fibrillation) ([place]) 12/03/2019  . Respiratory failure with hypoxia ([place]) 09/15/2019  . Pneumonia due to COVID-19 virus 09/15/2019  . Acute metabolic encephalopathy 15/17/6160  . UTI (urinary tract infection), bacterial 09/15/2019  . Pressure injury of skin 09/10/2019  . AKI (acute kidney injury) ([place])   . Acute respiratory distress syndrome (ARDS) due to COVID-19 virus ([place]) 09/07/2019    Past Surgical History:  Procedure Laterality Date  . INCISION  AND DRAINAGE PERIRECTAL ABSCESS Left 12/04/2019   Procedure: IRRIGATION AND DEBRIDEMENT LEFT BUTTOCK ABSCESS;  Surgeon: Georganna Skeans, MD;  Location: [facility];  Service: General;  Laterality: Left;  . INCISION AND DRAINAGE PERIRECTAL ABSCESS Left 12/10/2019   Procedure: REPEAT IRRIGATION AND DEBRIDEMENT BUTTOCK  ABSCESS;  Surgeon: Coralie Keens, MD;  Location: [facility];  Service: General;  Laterality: Left;     OB History   No obstetric history on file.     Family History  Family history unknown: Yes    Social History   Tobacco Use  . Smoking status: Never Smoker  . Smokeless tobacco: Never Used  Substance Use Topics  . Alcohol use: Not Currently  . Drug use: Not Currently    Home Medications Prior to Admission medications   Medication Sig Start Date End Date Taking? Authorizing Provider  Amino Acids-Protein Hydrolys (FEEDING SUPPLEMENT, PRO-STAT SUGAR FREE 64,) LIQD Take 30 mLs by mouth 2 (two) times daily. 01/05/20   Regalado, Belkys A, MD  ampicillin-sulbactam (UNASYN) IVPB Inject 3 g into the vein every 6 (six) hours for 16 days. Indication:  Osteo/abscess of coccyx, First Dose: No Last Day of Therapy:  01/21/2020 Labs - Once weekly:  CBC/D and BMP, Labs - Every other week:  ESR and CRP Method of administration: Mini-Bag Plus / Gravity Method of administration may be changed at the discretion of home infusion pharmacist  based upon assessment of the patient and/or caregiver's ability to self-administer the medication ordered. 01/05/20 01/21/20  Regalado, Belkys A, MD  apixaban (ELIQUIS) 5 MG TABS tablet Take 5 mg by mouth 2 (two) times daily.  08/11/19 08/05/20  [provider]  ascorbic acid (VITAMIN C) 1000 MG tablet Take 500 mg by mouth 2 (two) times daily. 09/08/12   [provider]  atorvastatin (LIPITOR) 10 MG tablet Take 10 mg by mouth at bedtime.  11/21/19   [provider]  calcium carbonate (TUMS - DOSED IN MG ELEMENTAL CALCIUM) 500 MG chewable  tablet Chew 1 tablet by mouth daily.    [provider]  carvedilol (COREG) 25 MG tablet Take 1 tablet (25 mg total) by mouth 2 (two) times daily with a meal. 12/17/19   Dessa Phi, DO  cholecalciferol (VITAMIN D3) 25 MCG (1000 UNIT) tablet Take 1,000 Units by mouth daily.    [provider]  diphenhydrAMINE-zinc acetate (BENADRYL) cream Apply 1 application topically 3 (three) times daily as needed for itching. 01/05/20   Regalado, Belkys A, MD  divalproex (DEPAKOTE) 500 MG DR tablet Take 1 tablet (500 mg total) by mouth 2 (two) times daily. 01/05/20   Regalado, Belkys A, MD  docusate sodium (COLACE) 100 MG capsule Take 1 capsule (100 mg total) by mouth 2 (two) times daily as needed for mild constipation. 01/05/20   Regalado, Cassie Freer, MD  Ensure Max Protein (ENSURE MAX PROTEIN) LIQD Take 330 mLs (11 oz total) by mouth daily. 01/06/20   Regalado, Belkys A, MD  furosemide (LASIX) 40 MG tablet Take 1 tablet (40 mg total) by mouth daily. 01/06/20   Regalado, Belkys A, MD  hydrocortisone cream 1 % Apply topically 2 (two) times daily. 01/05/20   Regalado, Belkys A, MD  insulin glargine (LANTUS) 100 UNIT/ML injection Inject 0.08 mLs (8 Units total) into the skin daily. 01/06/20   Regalado, Belkys A, MD  insulin lispro (HUMALOG) 100 UNIT/ML cartridge Inject into the skin See admin instructions. Inject into the skin 2 times a day between meals, per sliding scale: BGL 0-200 = give nothing; 201-250 = 2 units; 251-300 = 4 units; 301-350 = 6 units; 351-400 = 8 units; 401-450 = 10 units; 451-500 = 12 units; >450 = 12 units and CALL PROVIDER IF STILL ELEVATED AFTER 2 HOURS    [provider]  Ipratropium-Albuterol (COMBIVENT) 20-100 MCG/ACT AERS respimat Inhale 1 puff into the lungs every 6 (six) hours. Patient taking differently: Inhale 1 puff into the lungs every 6 (six) hours as needed ("for COPD").  09/21/19   Amin, Ankit Chirag, MD  JANUMET 50-1000 MG tablet Take 1 tablet by mouth 2 (two)  times daily. 10/30/19   [provider]  Omega-3 Fatty Acids (OMEGA-3 FISH OIL PO) Take 1 capsule by mouth daily.    [provider]  pantoprazole (PROTONIX) 40 MG tablet Take 1 tablet (40 mg total) by mouth 2 (two) times daily. 01/05/20   Regalado, Belkys A, MD  sennosides-docusate sodium (SENOKOT-S) 8.6-50 MG tablet Take 1 tablet by mouth daily.    [provider]  vancomycin IVPB Inject 1,000 mg into the vein every 12 (twelve) hours for 16 days. Indication:  Osteo/abscess of coccyx First Dose: No Last Day of Therapy:  01/21/2020 Labs - Sunday/Monday:  CBC/D, BMP, and vancomycin trough. Labs - Thursday:  BMP and vancomycin trough Labs - Every other week:  ESR and CRP Method of administration:Elastomeric Method of administration may be changed at the  discretion of the patient and/or caregiver's ability to self-administer the medication ordered. 01/05/20 01/21/20  Regalado, Jerald Kief A, MD    Allergies    Chlorhexidine gluconate and Acetazolamide er  Review of Systems   Review of Systems  Unable to perform ROS: Acuity of condition    Physical Exam Updated Vital Signs LMP  (LMP Unknown)   Physical Exam Vitals and nursing note reviewed.  Constitutional:      General: She is not in acute distress.    Appearance: Normal appearance. She is well-developed. She is not toxic-appearing.  HENT:     Head: Normocephalic and atraumatic.  Eyes:     General: Lids are normal.     Conjunctiva/sclera: Conjunctivae normal.     Pupils: Pupils are equal, round, and reactive to light.  Neck:     Thyroid: No thyroid mass.     Trachea: No tracheal deviation.  Cardiovascular:     Rate and Rhythm: Normal rate and regular rhythm.     Heart sounds: Normal heart sounds. No murmur. No gallop.   Pulmonary:     Effort: Pulmonary effort is normal. No respiratory distress.     Breath sounds: Normal breath sounds. No stridor. No decreased breath sounds, wheezing, rhonchi or rales.    Abdominal:     General: Bowel sounds are normal. There is no distension.     Palpations: Abdomen is soft.     Tenderness: There is no abdominal tenderness. There is no rebound.  Musculoskeletal:        General: No tenderness. Normal range of motion.     Cervical back: Normal range of motion and neck supple.  Skin:    General: Skin is warm and dry.     Findings: No abrasion or rash.  Neurological:     Mental Status: She is lethargic and disoriented.     GCS: GCS eye subscore is 2. GCS verbal subscore is 4. GCS motor subscore is 3.     Comments: Withdraws to pain in all 4 extremities  Psychiatric:        Attention and Perception: She is inattentive.     ED Results / Procedures / Treatments   Labs (all labs ordered are listed, but only abnormal results are displayed) Labs Reviewed  CULTURE, BLOOD (ROUTINE X 2)  CULTURE, BLOOD (ROUTINE X 2)  URINE CULTURE  SARS CORONAVIRUS 2 BY RT PCR (HOSPITAL ORDER, [facility])  LACTIC ACID, PLASMA  LACTIC ACID, PLASMA  COMPREHENSIVE METABOLIC PANEL  CBC WITH DIFFERENTIAL/PLATELET  APTT  PROTIME-INR  URINALYSIS, ROUTINE W REFLEX MICROSCOPIC  VALPROIC ACID LEVEL  CBG MONITORING, ED    EKG None  Radiology No results found.  Procedures Procedure Name: Intubation Date/Time: 01/19/2020 2:52 PM Performed by: Lacretia Leigh, MD Pre-anesthesia Checklist: Patient identified, Patient being monitored, Emergency Drugs available, Timeout performed and Suction available Oxygen Delivery Method: Non-rebreather mask Preoxygenation: Pre-oxygenation with 100% oxygen Induction Type: Rapid sequence Ventilation: Mask ventilation without difficulty Grade View: Grade I Tube size: 7.5 mm Number of attempts: 1 Airway Equipment and Method: Video-laryngoscopy Placement Confirmation: ETT inserted through vocal cords under direct vision,  CO2 detector and Breath sounds checked- equal and bilateral Secured at: 26 cm Dental Injury:  Teeth and Oropharynx as per pre-operative assessment       (including critical care time)  Medications Ordered in ED Medications  0.9 %  sodium chloride infusion (has no administration in time range)    ED Course  I have reviewed  the triage vital signs and the nursing notes.  Pertinent labs & imaging results that were available during my care of the patient were reviewed by me and considered in my medical decision making (see chart for details).    MDM Rules/Calculators/A&P                      Patient's chest x-ray consistent with pneumonia and possible CHF.  Patient started on IV antibiotics.  IV fluids held off due to concern for fluid overload.  Patient's lactate to make elevated 2.7.  Blood gas showed severe hypercapnia.  Patient intubated.  Patient became hypotensive.  Will order 30 cc/kg bolus as airway is protected now.  Discussed with Dr. Lake Bells from critical care and he will come and admit  CRITICAL CARE Performed by: Leota Jacobsen Total critical care time: 75 minutes Critical care time was exclusive of separately billable procedures and treating other patients. Critical care was necessary to treat or prevent imminent or life-threatening deterioration. Critical care was time spent personally by me on the following activities: development of treatment plan with patient and/or surrogate as well as nursing, discussions with consultants, evaluation of patient's response to treatment, examination of patient, obtaining history from patient or surrogate, ordering and performing treatments and interventions, ordering and review of laboratory studies, ordering and review of radiographic studies, pulse oximetry and re-evaluation of patient's condition. Final Clinical Impression(s) / ED Diagnoses Final diagnoses:  None    Rx / DC Orders ED Discharge Orders    None       Lacretia Leigh, MD 01/19/20 1457

## 2020-01-19 NOTE — Progress Notes (Signed)
Pharmacy Antibiotic Note  Becky Hendricks is a 74 y.o. female admitted on 01/19/2020 with HCAP.  Pharmacy has been consulted for vancomycin and meropenem dosing.  Pt was on vancomycin and Unasyn as OPAT for osteomyelitis of sacrum with last due to be on 6/10. Most recent available vancomycin dose was 1000 mg IV q12h . Per med rec last dose of vancomycin was at 0123 on 6/8.  Attempted to contact nursing home to see if any vancomycin levels were available. Phone not answered at nursing home.    Plan:  Meropenem 1 gr IV q8h    Obtained random vancomycin level to confirm level is not too high  Random level is 12, about 18 hours after last dose.  Therefore will start back on nursing home dose of vancomycin 1000 mg IV q12h   Monitor clinical course, renal function, cultures as available   Height: 5\' 4"  (162.6 cm) Weight: 95.5 kg (210 lb 8.6 oz) IBW/kg (Calculated) : 54.7  Temp (24hrs), Avg:99 F (37.2 C), Min:99 F (37.2 C), Max:99 F (37.2 C)  Recent Labs  Lab 01/19/20 1241 01/19/20 1251 01/19/20 1540  WBC 11.1*  --   --   CREATININE 0.43*  --   --   LATICACIDVEN  --  2.7* 3.5*    Estimated Creatinine Clearance: 71.2 mL/min (A) (by C-G formula based on SCr of 0.43 mg/dL (L)).    Allergies  Allergen Reactions  . Chlorhexidine Gluconate Itching  . Acetazolamide Er Rash    Antimicrobials this admission: PTA vancomycin and Unasyn   6/8 meropenem >>  6/8 vancomycin ( on admission, on med PTA )  >>   Dose adjustments this admission: 6/8 VR 12  Microbiology results: 6/8 BCx:  6/8 UCx:   6/8 MRSA PCR: negative   Thank you for allowing pharmacy to be a part of this patient's care.   8/8, PharmD, BCPS 01/19/2020 9:08 PM

## 2020-01-19 NOTE — ED Notes (Signed)
Date and time results received: 01/19/20 4:11 PM  (use smartphrase ".now" to insert current time)  Test: ABG pCO2 80.8   Name of Provider Notified: McQuaid  Orders Received? Or Actions Taken?: Orders Received - See Orders for details

## 2020-01-19 NOTE — ED Triage Notes (Signed)
Patient arrived by EMS from [facility].   Patient had AMS starting at 9 am today. Patient normally Alert and Oriented x 4.   Patient had sudden mental status change per facility.   Patient has hx of A-fib.

## 2020-01-19 NOTE — Progress Notes (Signed)
Patient transport to 1227 on full vent support and 100%. No complication noted.

## 2020-01-19 NOTE — ED Notes (Signed)
Date and time results received: 01/19/20 2:26 PM  (use smartphrase ".now" to insert current time)  Test: ABG CO2 >120 Lactic 2.7  Name of Provider Notified: Freida Busman  Orders Received? Or Actions Taken?: Orders Received - See Orders for details

## 2020-01-19 NOTE — H&P (Addendum)
NAME:  Gwendolin Briel, MRN:  [id number], DOB:  01-16-1946, LOS: 0 ADMISSION DATE:  01/19/2020, CONSULTATION DATE:  6/8 REFERRING MD:  Zenia Resides, CHIEF COMPLAINT:  Respiratory failure    Brief History   74 year old female being treated at skilled nursing facility for osteomyelitis.  Found with sudden mental status change, on arrival to the ER PCO2 greater than 120 with acute on chronic hypercarbic respiratory failure, chest x-ray with right greater than left airspace disease with element of right effusion critical care asked to admit.  History of present illness   74 year old female just discharged 5/23 after readmission for septic shock and respiratory failure in setting of osteomyelitis discharge to SNF on IV antibiotics.  Once again EMS called after being found with altered level of consciousness at nursing home.  This was felt to have happened acutely.  Evaluation in the emergency room demonstrated PCO2 of greater than 120,Mild leukocytosis, valproic acid level 29, pH 7.24, PCO2 greater than 120 PO2 92.  Chest x-ray showing right greater than left airspace disease with probable element of right effusion.  Past Medical History  Chronic respiratory failure on 2 L nasal cannula, morbid obesity, type 2 diabetes, hypertension, baseline bipolar disease, atrial fibrillation stage IV pressure ulcer involving the gluteal fold with bilateral ulcers on right and left buttocks, osteomyelitis of the coccyx. Just discharged 5/23 status post being found unresponsive at skilled nursing facility requiring intubation, all felt secondary to septic shock from underlying osteomyelitis.  Discharged back to skilled nursing facility on IV Unasyn and vancomycin.  As well as wound care. Significant Hospital Events   6/8 admitted w/ respiratory failure   Consults:    Procedures:  oett 6/8>>>  Significant Diagnostic Tests:  CT head 6/8: neg Echo 5/12: 1. Since the last study on 12/08/2019 LVEF hasn't changes. Right  ventricle  is now moderately dilated with moderately decreased systolic function and  new moderate pulmonary hypertension. Consider pulmonary embolism. 2. Left ventricular ejection fraction, by estimation, is 65 to 70%. The  left ventricle has normal function. The left ventricle has no regional wall motion abnormalities. Left ventricular diastolic parameters are  consistent with Grade II diastolic dysfunction (pseudonormalization). 3. Right ventricular systolic function is moderately reduced. The right ventricular size is moderately enlarged. There is moderately elevated  pulmonary artery systolic pressure. The estimated right ventricular systolic pressure is 22.6 mmHg.  4. Left atrial size was mildly dilated. 5. Right atrial size was moderately dilated.6. The mitral valve is normal in structure. No evidence of mitral valve regurgitation. No evidence of mitral stenosis. 7. The aortic valve is normal in structure. Aortic valve regurgitation is not visualized. Mild to moderate aortic valve sclerosis/calcification is  present, without any evidence of aortic stenosis. 8. The inferior vena cava is dilated in size with <50% respiratory  variability, suggesting right atrial pressure of 15 mmHg.   Micro Data:  BC 6/8>>> UC 6/8>>> resp culture 6/8>>>  Antimicrobials:  vanc 6/8>>> Meropenem 6/8>>  Interim history/subjective:  Now waking up   Objective   Blood pressure (Abnormal) 152/84, pulse 69, temperature 99 F (37.2 C), temperature source Rectal, resp. rate (Abnormal) 31, SpO2 93 %.       No intake or output data in the 24 hours ending 01/19/20 1458 There were no vitals filed for this visit.  Examination: General: obese white female sedated on propofol gtt HENT: NCAT no JVD orally admitted  Lungs: scattered coarse rhonchi w/ tactile fremitus  Cardiovascular: regular irreg afib on tele  Abdomen: soft  not tender  Extremities: cool, pulses palp no sig edema  Neuro: now waking up. Appears  purposeful  GU: clear but cloudy urine  Derm sacral dressing currently intact   Resolved Hospital Problem list     Assessment & Plan:  Acute on chronic hypoxic and hypercarbic respiratory failure. W/ what appears to be R>L airspace disease c/w either pulmonary edema or infiltrate w/ element of effusion-> high level of suspicion for acute heart failure but HCAP a possibility.  pcxr w/ bilateral airspace disease R>L w/ element of right effusion vs atx this is new c/w exam last admit  Unclear what is driving this. Plan Full vent support resp culture HCAP coverage Cont bds  PCT  VAP bundle PAD protocol Diuresis when BP will allow  ECHO (repeat)  Acute metabolic encephalopathy, superimposed on underlying bipolar disease: Certainly to some extent this can be explained by hypercarbia however it would be unusual for this to occur acutely unless it is drug/medication related or perhaps even consider postictal state from seizure, also consider flash edema, if this were respiratory mediated as the precipitating factor I would have expected some prodrome of worsening respiratory distress prior.  plan Cont supportive care Limit sedation Cont valproic acid Ventilatory support for now.  Prior to dc need to repeat ABG   ischial osteomyelitis (PTA) ->was to complete antibiotics 6/10 Plan Cont vanc and change Unasyn to meropenem (for possible HCAP)  Will have wound ostomy team re-eval   Fluid and electrolyte imbalance : hypernatremia.  Plan Free water replacement  Am chemistry   Diabetes type 2  Plan ssi  Ck betahydroxibuteric acid  Ck a1c  Atrial fibrillation, Hypertension Plan  Cont DOAC Cont tele  Holding coreg, lasix and losartan   Chronic anemia w/out evidence of bleeding Plan Trend cbc    Best practice:  Diet: NPO Pain/Anxiety/Delirium protocol (if indicated): 6/8  VAP protocol (if indicated): 6/8 DVT prophylaxis: DOAC GI prophylaxis: PPI Glucose control:  ssi Mobility: bedrest  Code Status: full code  Family Communication: son updated  Disposition: admit to ICU. PNA vs pulmonary edema w/ resultant acute on chronic resp failure   Labs   CBC: Recent Labs  Lab 01/19/20 1241  WBC 11.1*  NEUTROABS 8.8*  HGB 10.7*  HCT 38.0  MCV 106.4*  PLT 314    Basic Metabolic Panel: Recent Labs  Lab 01/19/20 1241  NA 149*  K 3.8  CL 82*  CO2 50*  GLUCOSE 245*  BUN 13  CREATININE 0.43*  CALCIUM 8.3*   GFR: Estimated Creatinine Clearance: 61.7 mL/min (A) (by C-G formula based on SCr of 0.43 mg/dL (L)). Recent Labs  Lab 01/19/20 1241 01/19/20 1251  WBC 11.1*  --   LATICACIDVEN  --  2.7*    Liver Function Tests: Recent Labs  Lab 01/19/20 1241  AST 10*  ALT 12  ALKPHOS 59  BILITOT 0.6  PROT 6.4*  ALBUMIN 2.8*   No results for input(s): LIPASE, AMYLASE in the last 168 hours. No results for input(s): AMMONIA in the last 168 hours.  ABG    Component Value Date/Time   PHART 7.241 (L) 01/19/2020 1405   PCO2ART >120.0 (HH) 01/19/2020 1405   PO2ART 92.7 01/19/2020 1405   HCO3 38.9 (H) 12/22/2019 0820   TCO2 41 (H) 12/22/2019 0820   O2SAT 95.6 01/19/2020 1405     Coagulation Profile: Recent Labs  Lab 01/19/20 1241  INR 1.3*    Cardiac Enzymes: No results for input(s): CKTOTAL, CKMB, CKMBINDEX, TROPONINI in  the last 168 hours.  HbA1C: Hgb A1c MFr Bld  Date/Time Value Ref Range Status  12/03/2019 11:20 PM 7.8 (H) 4.8 - 5.6 % Final    Comment:    (NOTE) Pre diabetes:          5.7%-6.4% Diabetes:              >6.4% Glycemic control for   <7.0% adults with diabetes   09/07/2019 07:50 PM 9.5 (H) 4.8 - 5.6 % Final    Comment:    (NOTE) Pre diabetes:          5.7%-6.4% Diabetes:              >6.4% Glycemic control for   <7.0% adults with diabetes     CBG: Recent Labs  Lab 01/19/20 1239  GLUCAP 216*    Review of Systems:   Not able   Past Medical History  She,  has a past medical history of Atrial  fibrillation ([place]), Bipolar disorder ([place]), Clostridium difficile diarrhea (08/02/2019), Dehydration, Essential hypertension, Morbid obesity ([place]), Type 2 diabetes mellitus ([place]), and Weakness.   Surgical History    Past Surgical History:  Procedure Laterality Date   INCISION AND DRAINAGE PERIRECTAL ABSCESS Left 12/04/2019   Procedure: IRRIGATION AND DEBRIDEMENT LEFT BUTTOCK ABSCESS;  Surgeon: Georganna Skeans, MD;  Location: [facility];  Service: General;  Laterality: Left;   INCISION AND DRAINAGE PERIRECTAL ABSCESS Left 12/10/2019   Procedure: REPEAT IRRIGATION AND DEBRIDEMENT BUTTOCK  ABSCESS;  Surgeon: Coralie Keens, MD;  Location: [facility];  Service: General;  Laterality: Left;     Social History   reports that she has never smoked. She has never used smokeless tobacco. She reports previous alcohol use. She reports previous drug use.   Family History   Her Family history is unknown by patient.   Allergies Allergies  Allergen Reactions   Chlorhexidine Gluconate Itching   Acetazolamide Er Rash     Home Medications  Prior to Admission medications   Medication Sig Start Date End Date Taking? Authorizing Provider  acetaminophen (TYLENOL) 325 MG tablet Take 650 mg by mouth every 6 (six) hours as needed for mild pain or headache.   Yes [provider]  Amino Acids-Protein Hydrolys (FEEDING SUPPLEMENT, PRO-STAT SUGAR FREE 64,) LIQD Take 30 mLs by mouth 2 (two) times daily. 01/05/20  Yes Regalado, Belkys A, MD  ampicillin-sulbactam (UNASYN) IVPB Inject 3 g into the vein every 6 (six) hours for 16 days. Indication:  Osteo/abscess of coccyx, First Dose: No Last Day of Therapy:  01/21/2020 Labs - Once weekly:  CBC/D and BMP, Labs - Every other week:  ESR and CRP Method of administration: Mini-Bag Plus / Gravity Method of administration may be changed at the discretion of home infusion pharmacist based upon assessment of the patient and/or caregiver's ability to self-administer the  medication ordered. 01/05/20 01/21/20 Yes Regalado, Belkys A, MD  apixaban (ELIQUIS) 5 MG TABS tablet Take 5 mg by mouth 2 (two) times daily.  08/11/19 08/05/20 Yes [provider]  ascorbic acid (VITAMIN C) 1000 MG tablet Take 500 mg by mouth 2 (two) times daily. 09/08/12  Yes [provider]  atorvastatin (LIPITOR) 10 MG tablet Take 10 mg by mouth at bedtime.  11/21/19  Yes [provider]  calcium carbonate (TUMS - DOSED IN MG ELEMENTAL CALCIUM) 500 MG chewable tablet Chew 1 tablet by mouth daily.   Yes [provider]  carvedilol (COREG) 25 MG tablet Take 1 tablet (25 mg  total) by mouth 2 (two) times daily with a meal. 12/17/19  Yes Dessa Phi, DO  diphenhydrAMINE-zinc acetate (BENADRYL) cream Apply 1 application topically 3 (three) times daily as needed for itching. 01/05/20  Yes Regalado, Belkys A, MD  divalproex (DEPAKOTE) 500 MG DR tablet Take 1 tablet (500 mg total) by mouth 2 (two) times daily. 01/05/20  Yes Regalado, Belkys A, MD  docusate sodium (COLACE) 100 MG capsule Take 1 capsule (100 mg total) by mouth 2 (two) times daily as needed for mild constipation. 01/05/20  Yes Regalado, Belkys A, MD  famotidine (PEPCID) 20 MG tablet Take 20 mg by mouth 2 (two) times daily.   Yes [provider]  furosemide (LASIX) 40 MG tablet Take 1 tablet (40 mg total) by mouth daily. 01/06/20  Yes Regalado, Belkys A, MD  hydrocortisone cream 1 % Apply topically 2 (two) times daily. 01/05/20  Yes Regalado, Belkys A, MD  insulin glargine (LANTUS) 100 UNIT/ML injection Inject 0.08 mLs (8 Units total) into the skin daily. 01/06/20  Yes Regalado, Belkys A, MD  insulin lispro (HUMALOG) 100 UNIT/ML cartridge Inject into the skin See admin instructions. Inject into the skin 2 times a day between meals, per sliding scale: BGL 0-200 = give nothing; 201-250 = 2 units; 251-300 = 4 units; 301-350 = 6 units; 351-400 = 8 units; 401-450 = 10 units; 451-500 = 12 units; >450 = 12 units and  CALL PROVIDER IF STILL ELEVATED AFTER 2 HOURS   Yes [provider]  Ipratropium-Albuterol (COMBIVENT) 20-100 MCG/ACT AERS respimat Inhale 1 puff into the lungs every 6 (six) hours. Patient taking differently: Inhale 1 puff into the lungs every 6 (six) hours as needed ("for COPD").  09/21/19  Yes Amin, Ankit Chirag, MD  JANUMET 50-1000 MG tablet Take 1 tablet by mouth 2 (two) times daily. 10/30/19  Yes [provider]  losartan (COZAAR) 25 MG tablet Take 12.5 mg by mouth daily.   Yes [provider]  Multiple Vitamin (MULTIVITAMIN) tablet Take 1 tablet by mouth daily.   Yes [provider]  pantoprazole (PROTONIX) 40 MG tablet Take 1 tablet (40 mg total) by mouth 2 (two) times daily. 01/05/20  Yes Regalado, Belkys A, MD  predniSONE (DELTASONE) 20 MG tablet Take 20 mg by mouth daily with breakfast. x7days ends 6.10.21   Yes [provider]  sennosides-docusate sodium (SENOKOT-S) 8.6-50 MG tablet Take 1 tablet by mouth daily.   Yes [provider]  vancomycin IVPB Inject 1,000 mg into the vein every 12 (twelve) hours for 16 days. Indication:  Osteo/abscess of coccyx First Dose: No Last Day of Therapy:  01/21/2020 Labs - Sunday/Monday:  CBC/D, BMP, and vancomycin trough. Labs - Thursday:  BMP and vancomycin trough Labs - Every other week:  ESR and CRP Method of administration:Elastomeric Method of administration may be changed at the discretion of the patient and/or caregiver's ability to self-administer the medication ordered. 01/05/20 01/21/20 Yes Regalado, Belkys A, MD  Zinc Gluconate 100 MG TABS Take 220 mg by mouth daily.   Yes [provider]  Cholecalciferol (VITAMIN D) 125 MCG (5000 UT) CAPS Take 10,000 Units by mouth once a week. Wednesday    [provider]  Ensure Max Protein (ENSURE MAX PROTEIN) LIQD Take 330 mLs (11 oz total) by mouth daily. Patient not taking: Reported on 01/19/2020 01/06/20   Elmarie Shiley, MD      Critical care time: 40 minutes      Erick Colace ACNP-BC [facility] Heckler  Pulmonary/Critical Care Pager # [phone] OR # [phone] if no answer

## 2020-01-19 NOTE — H&P (Signed)
Attending:    Subjective: 74 y/o female known to our service from a recent admission for a brief cardiac arrest of uncertain etiology presented today from her nursing home unresponsive and in hypercarbic respiratory failure.  No family was present when she was found unresponsive, the nursing home cannot connect me to her nurse, and she is unresponsive and ventilated so we can't get a history as to what happened.  In looking at her labs, she is significantly more hypercarbic than she was when she left the hospital a few weeks ago and her CXR has significant bilateral infiltrates and pleural effusions.   Past Medical History:  Diagnosis Date   Atrial fibrillation ([place])    Bipolar disorder ([place])    Clostridium difficile diarrhea 08/02/2019   Dehydration    Essential hypertension    Morbid obesity ([facility])    Type 2 diabetes mellitus ([place])    Weakness      Family History  Family history unknown: Yes     Social History   Socioeconomic History   Marital status: Single    Spouse name: Not on file   Number of children: Not on file   Years of education: Not on file   Highest education level: Not on file  Occupational History   Not on file  Tobacco Use   Smoking status: Never Smoker   Smokeless tobacco: Never Used  Substance and Sexual Activity   Alcohol use: Not Currently   Drug use: Not Currently   Sexual activity: Not Currently    Birth control/protection: Post-menopausal  Other Topics Concern   Not on file  Social History Narrative   Not on file   Social Determinants of Health   Financial Resource Strain:    Difficulty of Paying Living Expenses:   Food Insecurity:    Worried About Charity fundraiser in the Last Year:    Arboriculturist in the Last Year:   Transportation Needs:    Film/video editor (Medical):    Lack of Transportation (Non-Medical):   Physical Activity:    Days of Exercise per Week:    Minutes of Exercise per Session:    Stress:    Feeling of Stress :   Social Connections:    Frequency of Communication with Friends and Family:    Frequency of Social Gatherings with Friends and Family:    Attends Religious Services:    Active Member of Clubs or Organizations:    Attends Archivist Meetings:    Marital Status:   Intimate Partner Violence:    Fear of Current or Ex-Partner:    Emotionally Abused:    Physically Abused:    Sexually Abused:      Allergies  Allergen Reactions   Chlorhexidine Gluconate Itching   Acetazolamide Er Rash     @encmedstart @   Objective: Vitals:   01/19/20 1557 01/19/20 1558 01/19/20 1559 01/19/20 1600  BP:      Pulse:    (!) 46  Resp: (!) 6 (!) 6 (!) 0 (!) 7  Temp:      TempSrc:      SpO2:    100%   Vent Mode: PRVC FiO2 (%):  [100 %] 100 % Set Rate:  [16 bmp] 16 bmp Vt Set:  [430 mL] 430 mL PEEP:  [5 cmH20] 5 cmH20 Plateau Pressure:  [23 cmH20] 23 cmH20 No intake or output data in the 24 hours ending 01/19/20 1601  General:  In bed on vent HENT:  NCAT ETT in place PULM: rhonchi and crackles RLL B, vent supported breathing CV: RRR, no mgr GI: BS+, soft, nontender MSK: normal bulk and tone Neuro: sedated on vent     CBC    Component Value Date/Time   WBC 11.1 (H) 01/19/2020 1241   RBC 3.57 (L) 01/19/2020 1241   HGB 10.7 (L) 01/19/2020 1241   HCT 38.0 01/19/2020 1241   PLT 184 01/19/2020 1241   MCV 106.4 (H) 01/19/2020 1241   MCH 30.0 01/19/2020 1241   MCHC 28.2 (L) 01/19/2020 1241   RDW 16.9 (H) 01/19/2020 1241   LYMPHSABS 1.3 01/19/2020 1241   MONOABS 0.7 01/19/2020 1241   EOSABS 0.1 01/19/2020 1241   BASOSABS 0.1 01/19/2020 1241    BMET    Component Value Date/Time   NA 149 (H) 01/19/2020 1241   K 3.8 01/19/2020 1241   CL 82 (L) 01/19/2020 1241   CO2 50 (H) 01/19/2020 1241   GLUCOSE 245 (H) 01/19/2020 1241   BUN 13 01/19/2020 1241   CREATININE 0.43 (L) 01/19/2020 1241   CALCIUM 8.3 (L) 01/19/2020 1241    GFRNONAA >60 01/19/2020 1241   GFRAA >60 01/19/2020 1241    CXR images reviewed showing bilateral infiltrates and pleural effusions, elevated R hemidiaphragm  Impression/Plan: Acute on chronic respiratory failure with hypercapnea: unclear cause but with her bilateral infiltrates could be acute decompensated diastolic heart failure complicated by cor pulmonale (seen on recent echo) or pneumonia given thick secretions.  Could also be both problems at same time.  > admit to ICU > broad spectrum antibiotics given nosocomial source > send resp culture > hold home antihypertensives for now > will need repeat ABG after extubation and consideration of work up for worsening hypercarbia > hold saline as she appears volume overloaded on physical exam and fluid resuscitation would be harmful. Suspect lactic acid elevation is due to hypoxemia and not sepsis > hold home antihypertensives for now > vap prevention > full vent support  Hyperglycemia/DM2 > check ketone > glargine/ssi for now  Sacral wound: WOC   Star tube feeding  Continue depakote  Rest per note from Anders Simmonds, we formulated the plan together  I updated her brother by phone  My cc time 35 minutes  Heber [place], MD  PCCM Pager: [phone] Cell: [phone] After 3pm or if no response, call [phone]

## 2020-01-20 ENCOUNTER — Inpatient Hospital Stay (HOSPITAL_COMMUNITY): Payer: Medicare Other

## 2020-01-20 ENCOUNTER — Other Ambulatory Visit: Payer: Self-pay

## 2020-01-20 LAB — GLUCOSE, CAPILLARY
Glucose-Capillary: 194 mg/dL — ABNORMAL HIGH (ref 70–99)
Glucose-Capillary: 251 mg/dL — ABNORMAL HIGH (ref 70–99)
Glucose-Capillary: 126 mg/dL — ABNORMAL HIGH (ref 70–99)
Glucose-Capillary: 140 mg/dL — ABNORMAL HIGH (ref 70–99)

## 2020-01-20 LAB — URINE CULTURE: Culture: >=100000 — AB

## 2020-01-20 LAB — CBC
HCT: 31.1 % — ABNORMAL LOW (ref 36.0–46.0)
Hemoglobin: 8.9 g/dL — ABNORMAL LOW (ref 12.0–15.0)
MCH: 29.3 pg (ref 26.0–34.0)
MCHC: 28.6 g/dL — ABNORMAL LOW (ref 30.0–36.0)
MCV: 102.3 fL — ABNORMAL HIGH (ref 80.0–100.0)
Platelets: 154 10*3/uL (ref 150–400)
RBC: 3.04 MIL/uL — ABNORMAL LOW (ref 3.87–5.11)
RDW: 16.7 % — ABNORMAL HIGH (ref 11.5–15.5)
WBC: 7.5 10*3/uL (ref 4.0–10.5)
nRBC: 0 % (ref 0.0–0.2)

## 2020-01-20 LAB — CBC WITH DIFFERENTIAL/PLATELET
Abs Immature Granulocytes: 0.09 10*3/uL — ABNORMAL HIGH (ref 0.00–0.07)
Basophils Absolute: 0 10*3/uL (ref 0.0–0.1)
Basophils Relative: 0 %
Eosinophils Absolute: 0.3 10*3/uL (ref 0.0–0.5)
Eosinophils Relative: 4 %
HCT: 32.3 % — ABNORMAL LOW (ref 36.0–46.0)
Hemoglobin: 9.2 g/dL — ABNORMAL LOW (ref 12.0–15.0)
Immature Granulocytes: 1 %
Lymphocytes Relative: 21 %
Lymphs Abs: 1.8 10*3/uL (ref 0.7–4.0)
MCH: 29 pg (ref 26.0–34.0)
MCHC: 28.5 g/dL — ABNORMAL LOW (ref 30.0–36.0)
MCV: 101.9 fL — ABNORMAL HIGH (ref 80.0–100.0)
Monocytes Absolute: 0.7 10*3/uL (ref 0.1–1.0)
Monocytes Relative: 9 %
Neutro Abs: 5.3 10*3/uL (ref 1.7–7.7)
Neutrophils Relative %: 65 %
Platelets: 150 10*3/uL (ref 150–400)
RBC: 3.17 MIL/uL — ABNORMAL LOW (ref 3.87–5.11)
RDW: 17.2 % — ABNORMAL HIGH (ref 11.5–15.5)
WBC: 8.2 10*3/uL (ref 4.0–10.5)
nRBC: 0 % (ref 0.0–0.2)

## 2020-01-20 LAB — BASIC METABOLIC PANEL
Anion gap: 11 (ref 5–15)
BUN: 18 mg/dL (ref 8–23)
CO2: 47 mmol/L — ABNORMAL HIGH (ref 22–32)
Calcium: 7.8 mg/dL — ABNORMAL LOW (ref 8.9–10.3)
Chloride: 86 mmol/L — ABNORMAL LOW (ref 98–111)
Creatinine, Ser: 0.43 mg/dL — ABNORMAL LOW (ref 0.44–1.00)
GFR calc Af Amer: >60 mL/min (ref >60)
GFR calc non Af Amer: >60 mL/min (ref >60)
Glucose, Bld: 210 mg/dL — ABNORMAL HIGH (ref 70–99)
Potassium: 2.7 mmol/L — CL (ref 3.5–5.1)
Sodium: 144 mmol/L (ref 135–145)

## 2020-01-20 LAB — TROPONIN I (HIGH SENSITIVITY)
Troponin I (High Sensitivity): 22 ng/L — ABNORMAL HIGH (ref <18)
Troponin I (High Sensitivity): 22 ng/L — ABNORMAL HIGH (ref <18)

## 2020-01-20 LAB — PHOSPHORUS: Phosphorus: <1 mg/dL — CL (ref 2.5–4.6)

## 2020-01-20 LAB — MAGNESIUM: Magnesium: 1.3 mg/dL — ABNORMAL LOW (ref 1.7–2.4)

## 2020-01-20 IMAGING — DX DG CHEST 1V PORT
1 series · 1 of 1 positions shown · non-contrast
Comparison: Radiograph [DATE], CT [DATE]

CLINICAL DATA: Acute respiratory failure with hypoxemia

EXAM:
PORTABLE CHEST 1 VIEW

[chest ap]
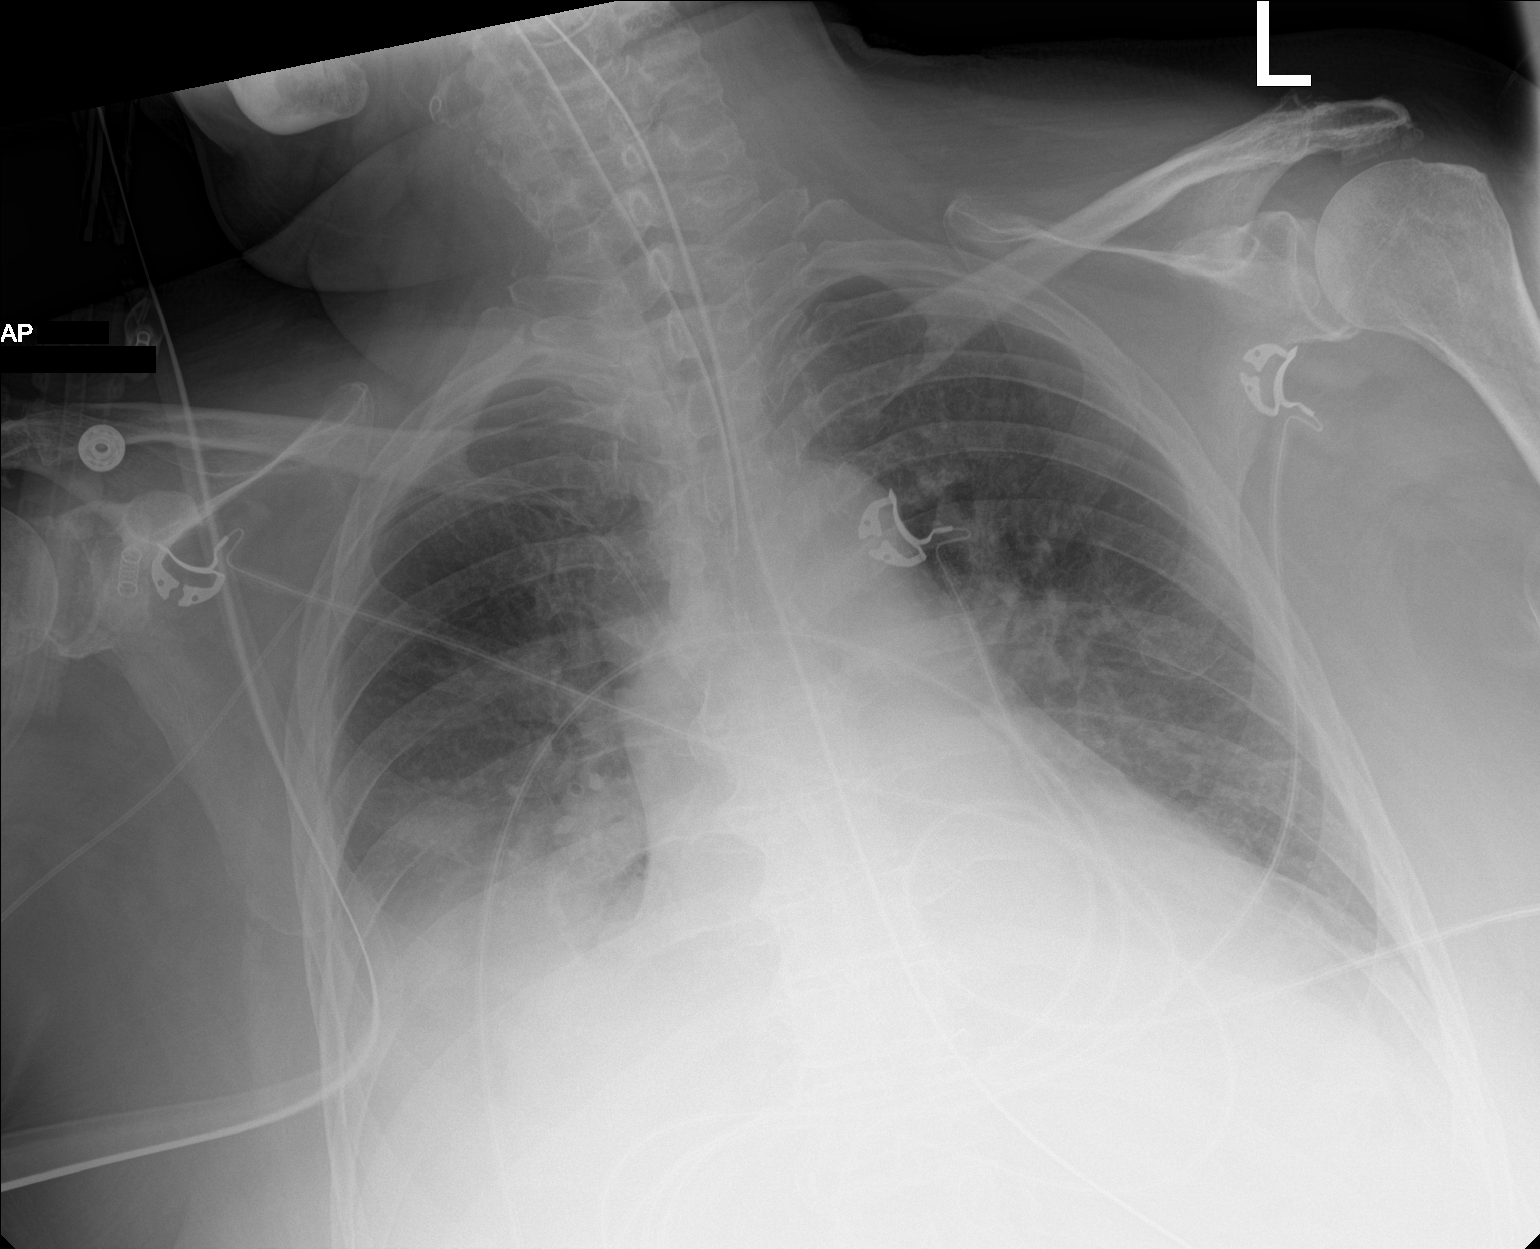

[1 of 1 positions shown; findings below may reference images not displayed]

FINDINGS: Endotracheal tube tip is now positioned low within the trachea,
cm from the carina. Consider retraction 2 cm to the mid trachea.
Transesophageal tube tip distal to the GE junction, below the level
of imaging. Telemetry leads overlie the chest. Central pulmonary
vascular congestion, similar to prior. Some hazy opacities towards
the bases likely reflecting atelectatic change with more patchy
consolidative opacity in the right lower lung which could reflect a
combination of layering pleural fluid and passive atelectasis though
underlying consolidation is difficult to exclude. Suspect some left
pleural effusion as well with an indistinct left hemidiaphragm. No
visible pneumothorax. Cardiomediastinal contours are stable. No
acute osseous or soft tissue abnormality. Degenerative changes are
present in the imaged spine and shoulders.
IMPRESSION: 1. Endotracheal tube tip is now low within the trachea, 1.5 cm from
the carina. Consider retraction 2 cm to the mid trachea.
2. Stable central pulmonary vascular congestion.
3. Bibasilar atelectatic change with more patchy consolidative
opacity in the right lower lung which could reflect a combination of
layering pleural fluid and passive atelectasis though underlying
consolidation is difficult to exclude.

## 2020-01-20 MED ORDER — ADULT MULTIVITAMIN LIQUID CH
15.0000 mL | Freq: Every day | ORAL | Status: DC
  Administered 2020-01-20: 15 mL
  Filled 2020-01-20: qty 15

## 2020-01-20 MED ORDER — FUROSEMIDE 10 MG/ML IJ SOLN
40.0000 mg | Freq: Four times a day (QID) | INTRAMUSCULAR | Status: AC
  Administered 2020-01-20 (×2): 40 mg via INTRAVENOUS
  Filled 2020-01-20 (×2): qty 4

## 2020-01-20 MED ORDER — LOSARTAN POTASSIUM 25 MG PO TABS
25.0000 mg | ORAL_TABLET | Freq: Every day | ORAL | Status: DC
  Administered 2020-01-20 – 2020-01-22 (×3): 25 mg via ORAL
  Filled 2020-01-20 (×3): qty 1

## 2020-01-20 MED ORDER — VITAL HIGH PROTEIN PO LIQD: 1000.0000 mL | ORAL | Status: DC

## 2020-01-20 MED ORDER — POTASSIUM CHLORIDE CRYS ER 20 MEQ PO TBCR
40.0000 meq | EXTENDED_RELEASE_TABLET | Freq: Once | ORAL | Status: AC
  Administered 2020-01-20: 40 meq via ORAL
  Filled 2020-01-20: qty 2

## 2020-01-20 MED ORDER — APIXABAN 5 MG PO TABS
5.0000 mg | ORAL_TABLET | Freq: Two times a day (BID) | ORAL | Status: DC
  Administered 2020-01-20 (×2): 5 mg
  Filled 2020-01-20 (×2): qty 1

## 2020-01-20 MED ORDER — CARVEDILOL 12.5 MG PO TABS
25.0000 mg | ORAL_TABLET | Freq: Two times a day (BID) | ORAL | Status: DC
  Administered 2020-01-20: 25 mg via ORAL
  Filled 2020-01-20: qty 2

## 2020-01-20 MED ORDER — VALPROIC ACID 250 MG/5ML PO SOLN
500.0000 mg | Freq: Two times a day (BID) | ORAL | Status: DC
  Administered 2020-01-20 (×2): 500 mg
  Filled 2020-01-20 (×3): qty 10

## 2020-01-20 MED ORDER — PRO-STAT SUGAR FREE PO LIQD: 60.0000 mL | Freq: Two times a day (BID) | ORAL | Status: DC

## 2020-01-20 MED ORDER — JUVEN PO PACK: 1.0000 | PACK | Freq: Two times a day (BID) | ORAL | Status: DC

## 2020-01-20 MED ORDER — SODIUM CHLORIDE 0.9% FLUSH
10.0000 mL | Freq: Two times a day (BID) | INTRAVENOUS | Status: DC
  Administered 2020-01-20 – 2020-01-25 (×8): 10 mL

## 2020-01-20 MED ORDER — DOCUSATE SODIUM 50 MG/5ML PO LIQD
100.0000 mg | Freq: Two times a day (BID) | ORAL | Status: DC
  Administered 2020-01-20: 100 mg
  Filled 2020-01-20: qty 10

## 2020-01-20 MED ORDER — POTASSIUM CHLORIDE 20 MEQ/15ML (10%) PO SOLN
40.0000 meq | Freq: Two times a day (BID) | ORAL | Status: AC
  Administered 2020-01-20 (×2): 40 meq
  Filled 2020-01-20 (×2): qty 30

## 2020-01-20 MED ORDER — POLYETHYLENE GLYCOL 3350 17 G PO PACK
17.0000 g | PACK | Freq: Every day | ORAL | Status: DC
  Administered 2020-01-20: 17 g
  Filled 2020-01-20: qty 1

## 2020-01-20 MED ORDER — MAGNESIUM SULFATE 2 GM/50ML IV SOLN
2.0000 g | Freq: Once | INTRAVENOUS | Status: AC
  Administered 2020-01-21: 2 g via INTRAVENOUS
  Filled 2020-01-20: qty 50

## 2020-01-20 MED ORDER — POTASSIUM PHOSPHATES 15 MMOLE/5ML IV SOLN
30.0000 mmol | Freq: Once | INTRAVENOUS | Status: AC
  Administered 2020-01-20: 30 mmol via INTRAVENOUS
  Filled 2020-01-20: qty 10

## 2020-01-20 MED ORDER — MAGNESIUM SULFATE 2 GM/50ML IV SOLN
2.0000 g | Freq: Once | INTRAVENOUS | Status: AC
  Administered 2020-01-20: 2 g via INTRAVENOUS
  Filled 2020-01-20: qty 50

## 2020-01-20 MED ORDER — MAGNESIUM SULFATE 4 GM/100ML IV SOLN
4.0000 g | Freq: Once | INTRAVENOUS | Status: AC
  Administered 2020-01-20: 4 g via INTRAVENOUS
  Filled 2020-01-20: qty 100

## 2020-01-20 MED ORDER — FAMOTIDINE 40 MG/5ML PO SUSR
20.0000 mg | Freq: Two times a day (BID) | ORAL | Status: DC
  Administered 2020-01-20 (×2): 20 mg
  Filled 2020-01-20 (×3): qty 2.5

## 2020-01-20 MED ORDER — SODIUM CHLORIDE 0.9 % IV SOLN
3.0000 g | Freq: Four times a day (QID) | INTRAVENOUS | Status: AC
  Administered 2020-01-20 – 2020-01-21 (×7): 3 g via INTRAVENOUS
  Filled 2020-01-20 (×2): qty 3
  Filled 2020-01-20 (×2): qty 8
  Filled 2020-01-20: qty 3
  Filled 2020-01-20: qty 8
  Filled 2020-01-20 (×2): qty 3

## 2020-01-20 MED ORDER — SODIUM CHLORIDE 0.9% FLUSH: 10.0000 mL | INTRAVENOUS | Status: DC | PRN

## 2020-01-20 MED ORDER — POTASSIUM CHLORIDE 10 MEQ/50ML IV SOLN
10.0000 meq | INTRAVENOUS | Status: AC
  Administered 2020-01-20 – 2020-01-21 (×4): 10 meq via INTRAVENOUS
  Filled 2020-01-20 (×4): qty 50

## 2020-01-20 NOTE — Progress Notes (Signed)
[facility] ([facility])  Becky Hendricks is our current palliative care pt in the community.  [facility] will follow and resume services once she is discharged.   Wallis Bamberg RN, BSN, CCRN [facility] Liaison

## 2020-01-20 NOTE — Progress Notes (Signed)
eLink Physician-Brief Progress Note Patient Name: Becky Hendricks DOB: 12-Apr-1946 MRN: [id number]   Date of Service  01/20/2020  HPI/Events of Note  K+ = 2.7, Mg++ = 1.3, PO4--- < 1.0 and Creatinine = 0.43  eICU Interventions  Will replace K+, PO4--- and Mg++.     Intervention Category Major Interventions: Electrolyte abnormality - evaluation and management  Sabrina Keough [place] 01/20/2020, 5:00 AM

## 2020-01-20 NOTE — Evaluation (Signed)
Clinical/Bedside Swallow Evaluation Patient Details  Name: Alaia Lordi MRN: [id number] Date of Birth: 10/25/1945  Today's Date: 01/20/2020 Time: SLP Start Time (ACUTE ONLY): 1502 SLP Stop Time (ACUTE ONLY): 1525 SLP Time Calculation (min) (ACUTE ONLY): 23 min  Past Medical History:  Past Medical History:  Diagnosis Date  . Atrial fibrillation ([place])   . Bipolar disorder ([place])   . Clostridium difficile diarrhea 08/02/2019  . Dehydration   . Essential hypertension   . Morbid obesity ([place])   . Type 2 diabetes mellitus ([place])   . Weakness    Past Surgical History:  Past Surgical History:  Procedure Laterality Date  . INCISION AND DRAINAGE PERIRECTAL ABSCESS Left 12/04/2019   Procedure: IRRIGATION AND DEBRIDEMENT LEFT BUTTOCK ABSCESS;  Surgeon: Georganna Skeans, MD;  Location: [facility];  Service: General;  Laterality: Left;  . INCISION AND DRAINAGE PERIRECTAL ABSCESS Left 12/10/2019   Procedure: REPEAT IRRIGATION AND DEBRIDEMENT BUTTOCK  ABSCESS;  Surgeon: Coralie Keens, MD;  Location: [facility];  Service: General;  Laterality: Left;   HPI:  74 Y/o with PMH of afib, bipolar, HTN, Obesity, DM, Pressure ulcer with several admissions for left buttock abscess s.p drainage arrest requiring brief CPR at nursing home. Intubated 6/8-01/20/20 and also 12/20/19-12/22/19 after cardiac arrest at that time. Seen previously by ST prior hospitalizations, on regular solids and thin liquids.  Pt CXR showed Rt LL opacity with pleural fluid and passive ATX consolidation difficult to exclude.  Swallow eval ordered.   Assessment / Plan / Recommendation Clinical Impression  74s. [place] is known to ST service from prior hospitalizations 09/2019, 12/2019 with unremarkable swallow.  Pt extubated today but her voice is clear and cough strong.  Difficulty fully participating in oral mech exam d/t reduced mentation (baseline cognitive deficits documented from prior SLP but not seen in the chart and pt speaks [place] as a 2nd  language), however, structures appeared functional with no asymmetry or abnormality noted. Pt provided with thin liquids (via tsp, straw),  applesauce and few bites of sandwich during session without s/s aspiration or dysphagia. Based on presentation during evaluation recommend regular/thin.  Medication with puree advised due to pt's poor positioning.  Pt presents with significant pain with HOB elevation, limited intake provided due to her pain and thus SLP will follow up x1 to assure tolerance of diet. SLP Visit Diagnosis: Dysphagia, unspecified (R13.10)    Aspiration Risk  Mild aspiration risk    Diet Recommendation Regular;Thin liquid   Liquid Administration via: Cup;Straw Medication Administration: Whole meds with puree Supervision: Staff to assist with self feeding Compensations: Slow rate;Small sips/bites(as upright as able to tolerate, tilt head forward if unable to be fully upright due to pain) Postural Changes: Seated upright at 90 degrees;Remain upright for at least 30 minutes after po intake    Other  Recommendations Oral Care Recommendations: Oral care BID   Follow up Recommendations None      Frequency and Duration min 1 x/week  1 week       Prognosis Prognosis for Safe Diet Advancement: Good Barriers to Reach Goals: Cognitive deficits      Swallow Study   General Date of Onset: 01/20/20 HPI: 74 Y/o with PMH of afib, bipolar, HTN, Obesity, DM, Pressure ulcer with several admissions for left buttock abscess s.p drainage arrest requiring brief CPR at nursing home. Intubated 6/8-01/20/20 and also 12/20/19-12/22/19 after cardiac arrest at that time. Seen previously by ST prior hospitalizations, on regular solids and thin liquids.  Pt CXR showed Rt LL  opacity with pleural fluid and passive ATX consolidation difficult to exclude.  Swallow eval ordered. Type of Study: Bedside Swallow Evaluation Previous Swallow Assessment: 12/12/19 WFL, BSE 12/20/2019 WFL Diet Prior to this Study:  NPO Temperature Spikes Noted: No Respiratory Status: Nasal cannula History of Recent Intubation: Yes Length of Intubations (days): 2 days Date extubated: 01/20/20 Behavior/Cognition: Alert;Cooperative Oral Cavity Assessment: Within Functional Limits Oral Care Completed by SLP: Recent completion by staff Oral Cavity - Dentition: Adequate natural dentition Vision: Functional for self-feeding Self-Feeding Abilities: Needs assist(right arm edema noted, pt needed to be fed by SLP) Patient Positioning: Upright in bed Baseline Vocal Quality: Normal Volitional Cough: Weak Volitional Swallow: Able to elicit    Oral/Motor/Sensory Function Overall Oral Motor/Sensory Function: Within functional limits   Ice Chips Ice chips: Not tested Other Comments: pt does not consume ice   Thin Liquid Thin Liquid: Within functional limits Presentation: Cup;Straw    Nectar Thick Nectar Thick Liquid: Not tested   Honey Thick Honey Thick Liquid: Not tested   Puree Puree: Within functional limits Presentation: Self Fed;Spoon   Solid     Solid: Within functional limits Other Comments: prolonged mastication suspect due to pt's pain      Chales Abrahams 01/20/2020,4:01 PM   Rolena Infante, MS [facility] SLP Acute Rehab Services Office [phone]

## 2020-01-20 NOTE — Progress Notes (Signed)
5 beat run of v-tach, elink notified. Awaiting new orders.

## 2020-01-20 NOTE — Consult Note (Addendum)
WOC Nurse Consult Note: Reason for Consult: Consult requested for sacrum and buttock wounds.  Pt is familiar to the [facility] team from a recent admission in May.  Wound type: Sacrum with chronic stage 4 pressure injury; 4.5X4X1cm, 95% red and moist, 5% yellow.  No odor, mod amt tan drainage, bone palpable. Left buttock with 2 full thickness wounds; appears to be from a previous surgical procedure since they are linear and symmetrical. .2X2X.8cm and .3X3X2cm; both red and moist with mod amt tan drainage, no odor Pressure Injury POA: Yes Dressing procedure/placement/frequency: Topical treatment orders provided for bedside nurses to perform as follows to absorb drainage and promote healing: Pack 2 wounds to left buttock and also sacrum wound with alginate Q day [place] # [phone]) then cover with foam dressing. Moisten with NS to remove previous dressing. (Change foam dressing Q 3 days or PRN soiling.) Please re-consult if further assistance is needed.  Thank-you,  Cammie Mcgee MSN, RN, CWOCN, [place], CNS [phone]

## 2020-01-20 NOTE — Procedures (Signed)
Extubation Procedure Note  Patient Details:   Name: Becky Hendricks DOB: February 11, 1946 MRN: [id number]   Airway Documentation:    Vent end date: 01/20/20 Vent end time: 1135   Evaluation  O2 sats: stable throughout Complications: No apparent complications Patient did tolerate procedure well. Bilateral Breath Sounds: Diminished, Coarse crackles  Pt placed on 2l nasal cannula  Renold Genta 01/20/2020, 11:52 AM

## 2020-01-20 NOTE — Progress Notes (Signed)
eLink Physician-Brief Progress Note Patient Name: Becky Hendricks DOB: 1946-02-21 MRN: [id number]   Date of Service  01/20/2020  HPI/Events of Note  Patient had a 5 beat run of wide complex tachycardia with spontaneous termination, last K+ 2.7, last Mg+ 1.3  eICU Interventions  Electrolyte protocol ordered for aggressive repletion of electrolytes,  BMP and Mg+ level at 3 a.m.        Thomasene Lot Ibraham Levi 01/20/2020, 9:30 PM

## 2020-01-20 NOTE — Progress Notes (Signed)
NAME:  Becky Hendricks, MRN:  [id number], DOB:  June 17, 1946, LOS: 1 ADMISSION DATE:  01/19/2020, CONSULTATION DATE:  6/8 REFERRING MD:  Freida Busman, CHIEF COMPLAINT:  Found unresponsive   Brief History   74 y/o female admitted from a SNF after being found unresponsive, hypercarbic.  Has recent history of repeated hospitalizations in setting of osteomyelitis.    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  Significant Hospital Events   6/8 admission  Consults:  PCCM  Procedures:  6/8 ETT >   Significant Diagnostic Tests:  CT head 6/8 > NAICP 12/2019 > LVEF 65-70%, grade 2 diastolic dysfunction, RV dilated, enlarged, elevated PA systolic pressure 59.6 mmHg, dilated left atrium, right atrium dilated  Micro Data:  6/8 sars cov 2 > negative 6/8 blood culture >  6/8 urine culture >   Antimicrobials:  6/8 vanc >  6/8 meropenem >    Interim history/subjective:  Resting comfortably Waking up Passing SBT  Objective   Blood pressure 126/84, pulse (!) 54, temperature 99.1 F (37.3 C), temperature source Oral, resp. rate 17, height 5\' 4"  (1.626 m), weight 96.8 kg, SpO2 96 %.    Vent Mode: PSV FiO2 (%):  [40 %-100 %] 40 % Set Rate:  [14 bmp-16 bmp] 14 bmp Vt Set:  [430 mL] 430 mL PEEP:  [5 cmH20] 5 cmH20 Pressure Support:  [8 cmH20] 8 cmH20 Plateau Pressure:  [16 cmH20-23 cmH20] 17 cmH20   Intake/Output Summary (Last 24 hours) at 01/20/2020 0933 Last data filed at 01/20/2020 03/21/2020 Gross per 24 hour  Intake 1018.87 ml  Output 150 ml  Net 868.87 ml   Filed Weights   01/19/20 1757 01/20/20 0450  Weight: 95.5 kg 96.8 kg    Examination: General:  In bed on vent HENT: NCAT ETT in place PULM: Rhonchi bases B, vent supported breathing CV: RRR, no mgr GI: BS+, soft, nontender MSK: normal bulk and tone Neuro: drowsy on vent, wakes up, follows  commands  6/8 CXR > bilateral lower lobe infiltrates and effusions  Resolved Hospital Problem list     Assessment & Plan:  Acute respiratory failure with hypoxemia due to acute pulmonary edema due to decompensated diastolic heart failure; also with WHO group 2 pulmonary hypertension: Baseline worsening chronic hypercarbic respiratory failure> worsening CHF prior to admission? Cause uncertain diurese lasix x2 doses Wake up now, consider extubation VAP prevention Restart home coreg, losartan Suspect will need cardiology evaluation given pulmonary hypertension, ischemia evaluation not complete Check tropoinin Check 12 lead Will need ABG after extubation to assess daytime resting hypercarbia  Atrial fibrillation eliquis tele  Hypophosphatemia Hypokalemia Hypomagnesemia Replace electrolytes Monitor BMET and UOP Replace electrolytes as needed  Need for sedation for mechanical ventilation RASS goal -1 precedex Prn versed and fentanyl  Osteomyelitis Concern for HCAP > seems less likely, favor pulmonary edema Continue vanc Restart unasyn per home protocol Stop meropenem Monitor cultures  DM2 with hyperglycemia Continue Glargine Continue SSI  Seizure disorder Continue depakote  Best practice:  Diet: tube feeding Pain/Anxiety/Delirium protocol (if indicated): yes, as above VAP protocol (if indicated): yes DVT prophylaxis: eliquis GI prophylaxis: famotidine Glucose control: SSI glargine Mobility: bed rest Code Status: full Family Communication: updated brother by phone on 6/8, he is coming to visit now Disposition: remain in ICU  Labs   CBC: Recent Labs  Lab 01/19/20 1241 01/20/20 0346  WBC 11.1* 7.5  NEUTROABS 8.8*  --  HGB 10.7* 8.9*  HCT 38.0 31.1*  MCV 106.4* 102.3*  PLT 184 277    Basic Metabolic Panel: Recent Labs  Lab 01/19/20 1241 01/20/20 0346  NA 149* 144  K 3.8 2.7*  CL 82* 86*  CO2 50* 47*  GLUCOSE 245* 210*  BUN 13 18  CREATININE  0.43* 0.43*  CALCIUM 8.3* 7.8*  MG 1.5* 1.3*  PHOS 4.6 <1.0*   GFR: Estimated Creatinine Clearance: 71.7 mL/min (A) (by C-G formula based on SCr of 0.43 mg/dL (L)). Recent Labs  Lab 01/19/20 1241 01/19/20 1251 01/19/20 1540 01/20/20 0346  WBC 11.1*  --   --  7.5  LATICACIDVEN  --  2.7* 3.5*  --     Liver Function Tests: Recent Labs  Lab 01/19/20 1241  AST 10*  ALT 12  ALKPHOS 59  BILITOT 0.6  PROT 6.4*  ALBUMIN 2.8*   No results for input(s): LIPASE, AMYLASE in the last 168 hours. No results for input(s): AMMONIA in the last 168 hours.  ABG    Component Value Date/Time   PHART 7.397 01/19/2020 1552   PCO2ART 80.8 (HH) 01/19/2020 1552   PO2ART 279 (H) 01/19/2020 1552   HCO3 48.7 (H) 01/19/2020 1552   TCO2 41 (H) 12/22/2019 0820   O2SAT >100.0 01/19/2020 1552     Coagulation Profile: Recent Labs  Lab 01/19/20 1241  INR 1.3*    Cardiac Enzymes: No results for input(s): CKTOTAL, CKMB, CKMBINDEX, TROPONINI in the last 168 hours.  HbA1C: Hgb A1c MFr Bld  Date/Time Value Ref Range Status  12/03/2019 11:20 PM 7.8 (H) 4.8 - 5.6 % Final    Comment:    (NOTE) Pre diabetes:          5.7%-6.4% Diabetes:              >6.4% Glycemic control for   <7.0% adults with diabetes   09/07/2019 07:50 PM 9.5 (H) 4.8 - 5.6 % Final    Comment:    (NOTE) Pre diabetes:          5.7%-6.4% Diabetes:              >6.4% Glycemic control for   <7.0% adults with diabetes     CBG: Recent Labs  Lab 01/19/20 1239 01/19/20 1651 01/19/20 2009 01/19/20 2325 01/20/20 0749  GLUCAP 216* 173* 139* 182* 194*    Critical care time: 40 minutes    Roselie Awkward, MD [place] PCCM Pager: [phone] Cell: [phone] If no response, call [phone]

## 2020-01-20 NOTE — Progress Notes (Signed)
Initial Nutrition Assessment  DOCUMENTATION CODES:   Obesity unspecified  INTERVENTION:  - will adjust TF regimen: Vital High Protein @ 35 ml/hr with 60 ml prostat BID and 1 packet juven BID via OGT. - this regimen will provide 1430 kcal (107% estimated kcal need), 138 grams protein, and 702 ml free water. - free water flush, if desired, to be per MD/NP.    NUTRITION DIAGNOSIS:   Inadequate oral intake related to inability to eat as evidenced by NPO status.  GOAL:   Provide needs based on ASPEN/SCCM guidelines  MONITOR:   Vent status, TF tolerance, Labs, Weight trends, Skin  REASON FOR ASSESSMENT:   Ventilator, Consult Enteral/tube feeding initiation and management  ASSESSMENT:   74 y/o female with medical hx of afib, bipolar disorder, HTN, and type 2 DM. She was recently admitted for a brief cardiac arrest. She was found unresponsive at nursing home and was transferred to the ED and was subsequently intubated. CXR showed significant bilateral infiltrates and pleural effusions.  Patient was intubated in the ED. OGT in place (gastric) and she is receiving TF per protocol: Vital High Protein @ 40 ml/hr with 30 ml prostat BID. This regimen is providing 1160 kcal, 114 grams protein, and 802 ml free water. Noted hypokalemia, hypomagnesemia, and hypophosphatemia with repletion ordered.  No family/visitors at bedside. Able to talk with RN at bedside and she reports plan is for hopeful extubation within the next few hours. No issues with TF this shift.   Per chart review, weight today is 213 lb, weight yesterday was 210 lb, weight on 5/26 documented as 158 lb, weight on 4/29 and 2/7 documented as 242 lb, and weight on 1/25 as 220 lb. Will monitor weight trends this admission.   Per notes: - osteomyelitis - HCAP vs pulmonary edema   Patient is currently intubated on ventilator support MV: 7.6 L/min Temp (24hrs), Avg:98.7 F (37.1 C), Min:97.8 F (36.6 C), Max:99.1 F (37.3  C) Propofol: none  Labs reviewed; CBG: 194 mg/dl, K: 2.7 mmol/l, Cl: 86 mmol/l, creatinine: 0.43 mg/dl, Ca: 7.8 mg/dl, Phos: <1 mg/dl, Mg: 1.3 mg/dl. Medications reviewed; 100 mg colace BID, 20 mg pepcid per OGT BID, 40 mg IV lasix x2 doses 6/9, sliding scale novolog, 15 units lantus/day, 2 g IV Mg sulfate x1 run 6/9, 17 g miralax/day, 40 mEq KCl x2 doses 6/9, 30 mmol IV KPhos x1 run 6/9. Drip; precedex @ 0.3 mcg/kg/hr.     NUTRITION - FOCUSED PHYSICAL EXAM:  completed; no muscle or fat wasting, mild edema to BLE.  Diet Order:   Diet Order            Diet NPO time specified  Diet effective now              EDUCATION NEEDS:   No education needs have been identified at this time  Skin:  Skin Assessment: Skin Integrity Issues: Skin Integrity Issues:: Stage IV Stage IV: L buttocks x2; sacrum  Last BM:  6/9  Height:   Ht Readings from Last 1 Encounters:  01/19/20 5\' 4"  (1.626 m)    Weight:   Wt Readings from Last 1 Encounters:  01/20/20 96.8 kg    Estimated Nutritional Needs:  Kcal:  1050-1337 kcal Protein:  >/= 136 grams (2.5 grams/kg) Fluid:  >/= 2 L/day     03/21/20, MS, RD, LDN, CNSC Inpatient Clinical Dietitian RD pager # available in [facility]  After hours/weekend pager # available in [facility]

## 2020-01-21 ENCOUNTER — Encounter (HOSPITAL_COMMUNITY): Payer: Self-pay | Admitting: Pulmonary Disease

## 2020-01-21 DIAGNOSIS — J810 Acute pulmonary edema: Secondary | ICD-10-CM | POA: Diagnosis present

## 2020-01-21 LAB — COMPREHENSIVE METABOLIC PANEL
ALT: 10 U/L (ref 0–44)
AST: 11 U/L — ABNORMAL LOW (ref 15–41)
Albumin: 2.4 g/dL — ABNORMAL LOW (ref 3.5–5.0)
Alkaline Phosphatase: 45 U/L (ref 38–126)
Anion gap: 9 (ref 5–15)
BUN: 12 mg/dL (ref 8–23)
CO2: 44 mmol/L — ABNORMAL HIGH (ref 22–32)
Calcium: 8 mg/dL — ABNORMAL LOW (ref 8.9–10.3)
Chloride: 86 mmol/L — ABNORMAL LOW (ref 98–111)
Creatinine, Ser: <0.3 mg/dL — ABNORMAL LOW (ref 0.44–1.00)
Glucose, Bld: 115 mg/dL — ABNORMAL HIGH (ref 70–99)
Potassium: 4.2 mmol/L (ref 3.5–5.1)
Sodium: 139 mmol/L (ref 135–145)
Total Bilirubin: 0.6 mg/dL (ref 0.3–1.2)
Total Protein: 5.5 g/dL — ABNORMAL LOW (ref 6.5–8.1)

## 2020-01-21 LAB — GLUCOSE, CAPILLARY
Glucose-Capillary: 114 mg/dL — ABNORMAL HIGH (ref 70–99)
Glucose-Capillary: 120 mg/dL — ABNORMAL HIGH (ref 70–99)
Glucose-Capillary: 176 mg/dL — ABNORMAL HIGH (ref 70–99)
Glucose-Capillary: 200 mg/dL — ABNORMAL HIGH (ref 70–99)
Glucose-Capillary: 202 mg/dL — ABNORMAL HIGH (ref 70–99)
Glucose-Capillary: 230 mg/dL — ABNORMAL HIGH (ref 70–99)

## 2020-01-21 LAB — MAGNESIUM: Magnesium: 2.9 mg/dL — ABNORMAL HIGH (ref 1.7–2.4)

## 2020-01-21 LAB — PHOSPHORUS: Phosphorus: 3.3 mg/dL (ref 2.5–4.6)

## 2020-01-21 MED ORDER — CARVEDILOL 12.5 MG PO TABS
12.5000 mg | ORAL_TABLET | Freq: Two times a day (BID) | ORAL | Status: DC
  Administered 2020-01-21 – 2020-01-27 (×12): 12.5 mg via ORAL
  Filled 2020-01-21 (×12): qty 1

## 2020-01-21 MED ORDER — APIXABAN 5 MG PO TABS
5.0000 mg | ORAL_TABLET | Freq: Two times a day (BID) | ORAL | Status: DC
  Administered 2020-01-21 – 2020-01-27 (×13): 5 mg via ORAL
  Filled 2020-01-21 (×13): qty 1

## 2020-01-21 MED ORDER — FAMOTIDINE 20 MG PO TABS
20.0000 mg | ORAL_TABLET | Freq: Two times a day (BID) | ORAL | Status: DC
  Filled 2020-01-21: qty 1

## 2020-01-21 MED ORDER — HYDRALAZINE HCL 25 MG PO TABS
25.0000 mg | ORAL_TABLET | Freq: Three times a day (TID) | ORAL | Status: DC
  Administered 2020-01-21 – 2020-01-27 (×16): 25 mg via ORAL
  Filled 2020-01-21 (×17): qty 1

## 2020-01-21 MED ORDER — ORAL CARE MOUTH RINSE
15.0000 mL | Freq: Two times a day (BID) | OROMUCOSAL | Status: DC
  Administered 2020-01-21 – 2020-01-27 (×11): 15 mL via OROMUCOSAL

## 2020-01-21 MED ORDER — DIVALPROEX SODIUM 250 MG PO DR TAB
500.0000 mg | DELAYED_RELEASE_TABLET | Freq: Two times a day (BID) | ORAL | Status: DC
  Administered 2020-01-21 – 2020-01-27 (×13): 500 mg via ORAL
  Filled 2020-01-21 (×2): qty 2
  Filled 2020-01-21: qty 1
  Filled 2020-01-21 (×2): qty 2
  Filled 2020-01-21: qty 1
  Filled 2020-01-21 (×8): qty 2

## 2020-01-21 MED ORDER — INSULIN ASPART 100 UNIT/ML ~~LOC~~ SOLN
0.0000 [IU] | Freq: Every day | SUBCUTANEOUS | Status: DC
  Administered 2020-01-22 – 2020-01-25 (×3): 2 [IU] via SUBCUTANEOUS
  Administered 2020-01-26: 3 [IU] via SUBCUTANEOUS

## 2020-01-21 MED ORDER — FUROSEMIDE 10 MG/ML IJ SOLN
40.0000 mg | Freq: Four times a day (QID) | INTRAMUSCULAR | Status: AC
  Administered 2020-01-21 (×2): 40 mg via INTRAVENOUS
  Filled 2020-01-21 (×2): qty 4

## 2020-01-21 MED ORDER — INSULIN GLARGINE 100 UNIT/ML ~~LOC~~ SOLN
15.0000 [IU] | Freq: Every day | SUBCUTANEOUS | Status: DC
  Administered 2020-01-21 – 2020-01-26 (×6): 15 [IU] via SUBCUTANEOUS
  Filled 2020-01-21 (×6): qty 0.15

## 2020-01-21 MED ORDER — ADULT MULTIVITAMIN W/MINERALS CH
1.0000 | ORAL_TABLET | Freq: Every day | ORAL | Status: DC
  Administered 2020-01-21 – 2020-01-27 (×7): 1 via ORAL
  Filled 2020-01-21 (×7): qty 1

## 2020-01-21 MED ORDER — INSULIN ASPART 100 UNIT/ML ~~LOC~~ SOLN
0.0000 [IU] | Freq: Three times a day (TID) | SUBCUTANEOUS | Status: DC
  Administered 2020-01-21 (×2): 5 [IU] via SUBCUTANEOUS
  Administered 2020-01-22: 2 [IU] via SUBCUTANEOUS
  Administered 2020-01-22: 5 [IU] via SUBCUTANEOUS
  Administered 2020-01-22: 3 [IU] via SUBCUTANEOUS
  Administered 2020-01-23: 5 [IU] via SUBCUTANEOUS
  Administered 2020-01-23 (×2): 3 [IU] via SUBCUTANEOUS
  Administered 2020-01-24: 5 [IU] via SUBCUTANEOUS
  Administered 2020-01-24 (×2): 2 [IU] via SUBCUTANEOUS
  Administered 2020-01-25: 5 [IU] via SUBCUTANEOUS
  Administered 2020-01-25: 2 [IU] via SUBCUTANEOUS
  Administered 2020-01-26: 5 [IU] via SUBCUTANEOUS
  Administered 2020-01-26: 3 [IU] via SUBCUTANEOUS
  Administered 2020-01-27: 5 [IU] via SUBCUTANEOUS
  Administered 2020-01-27: 3 [IU] via SUBCUTANEOUS

## 2020-01-21 NOTE — Progress Notes (Signed)
  Speech Language Pathology Treatment: Dysphagia  Patient Details Name: Becky Hendricks MRN: [id number] DOB: [birth year] Today's Date: 01/21/2020 Time: 1798-1025 SLP Time Calculation (min) (ACUTE ONLY): 13 min  Assessment / Plan / Recommendation Clinical Impression  Delayed cough x1 after intake of juice - pt denies this occurring today and RN reports tolerance of po intake.  Intake listed as 75% and pt's lung sounds are decreased.  Pt denies dysphagia, admits to some coughing but not associated with po intake.   Last CXR yesterday 01/20/2020 showed Bibasilar atelectatic change with more patchy consolidative opacity in the right lower lung which could reflect a combination of layering pleural fluid and passive atelectasis though underlying consolidation is difficult to exclude.  Fortunately pt has a strong cough and is able to feed herself which maximizes airway protection.    Educated pt to general precautions - small boluses, staying upright after po, rest breaks if dyspneic.  No SlP follow up indicated. Thanks.     HPI HPI: 74 Y/o with PMH of afib, bipolar, HTN, Obesity, DM, Pressure ulcer with several admissions for left buttock abscess s.p drainage arrest requiring brief CPR at nursing home. Intubated 6/8-01/20/20 and also 12/20/19-12/22/19 after cardiac arrest at that time. Seen previously by ST prior hospitalizations, on regular solids and thin liquids.  Pt CXR showed Rt LL opacity with pleural fluid and passive ATX consolidation difficult to exclude.  Swallow eval ordered.      SLP Plan  All goals met       Recommendations  Diet recommendations: Regular;Thin liquid Liquids provided via: Cup;Straw Medication Administration: Whole meds with puree (as tolerated, with puree if problematic) Supervision: Patient able to self feed Compensations: Slow rate;Small sips/bites (as upright as able to tolerate, tilt head forward if unable to be fully upright due to pain) Postural Changes and/or  Swallow Maneuvers: Seated upright 90 degrees;Upright 30-60 min after meal                Oral Care Recommendations: Oral care BID Follow up Recommendations: None SLP Visit Diagnosis: Dysphagia, unspecified (R13.10) Plan: All goals met       GO                Macario Golds 01/21/2020, 2:21 PM  Kathleen Lime, MS [place] Office [phone]

## 2020-01-21 NOTE — Evaluation (Signed)
Physical Therapy Evaluation Patient Details Name: Becky Hendricks MRN: [id number] DOB: 1946/07/08 Today's Date: 01/21/2020   History of Present Illness  74 Y/o with PMH of afib, bipolar, HTN, Obesity, DM, Pressure ulcer with recent admission for left buttock abscess s.p drainage , recent  admission for a brief cardiac arrest of uncertain etiology presented 01/19/20  from her nursing home unresponsive and in hypercarbic respiratory failure, extubated 01/20/20.  Clinical Impression  The patient calling out for assistance, stating that her eyes hurt. Patient assisted by 2 total to sit on bed edge x ~1 minute. Patient began to return herself to supine. Patient is confused, does follow some commands. Patient has been in and out of hospital m multiple times, has resided in SNF several months. Skilled PT interventions are limited at this time. Recommend return to facility with ? Needs for PT. [place] with SPO2 > 91%, Pt admitted with above diagnosis.  Pt currently with functional limitations due to the deficits listed below (see PT Problem List). Pt will benefit from skilled PT to increase their independence and safety with mobility to allow discharge to the venue listed below.       Follow Up Recommendations  (return to SNF? but ? if PT skilled)    Equipment Recommendations  None recommended by PT    Recommendations for Other Services       Precautions / Restrictions Precautions Precautions: Fall Precaution Comments: sacral wound, stage IV with osteo      Mobility  Bed Mobility   Bed Mobility: Rolling;Supine to Sit;Sit to Supine Rolling: Mod assist   Supine to sit: Max assist;+2 for physical assistance;+2 for safety/equipment Sit to supine: Max assist;+2 for physical assistance;+2 for safety/equipment   General bed mobility comments: patient follows to reach for rails, flexes hip in prep to roll, more assistance  required to roll to the left  Transfers                 General transfer  comment: NT  Ambulation/Gait                Stairs            Wheelchair Mobility    Modified Rankin (Stroke Patients Only)       Balance   Sitting-balance support: Feet supported;Bilateral upper extremity supported Sitting balance-Leahy Scale: Poor Sitting balance - Comments: patient not wanting to sit up , began to lie back down after about 1 minute. did sit near midline with min assistance                                     Pertinent Vitals/Pain Faces Pain Scale: Hurts even more Pain Location: eyes. Pain Descriptors / Indicators: Discomfort Pain Intervention(s): Monitored during session    Home Living Family/patient expects to be discharged to:: Skilled nursing facility                 Additional Comments: total care, presumably Out of bed via lift,    Prior Function     Gait / Transfers Assistance Needed: assistance of staff for ADL's and transfers           Hand Dominance        Extremity/Trunk Assessment        Lower Extremity Assessment RLE Deficits / Details: lifts leg from bed, moves ankle LLE Deficits / Details: similar to right  Communication      Cognition Arousal/Alertness: Awake/alert   Overall Cognitive Status: History of cognitive impairments - at baseline Area of Impairment: Awareness;Safety/judgement;Problem solving                 Orientation Level: Place;Time;Situation Current Attention Level: Sustained Memory: Decreased short-term memory Following Commands: Follows one step commands inconsistently Safety/Judgement: Decreased awareness of deficits Awareness: Intellectual Problem Solving: Slow processing;Requires verbal cues;Requires tactile cues        General Comments      Exercises     Assessment/Plan    PT Assessment Patient needs continued PT services  PT Problem List Decreased strength;Decreased activity tolerance;Decreased balance;Decreased mobility;Decreased  coordination       PT Treatment Interventions Functional mobility training;Therapeutic activities;Therapeutic exercise    PT Goals (Current goals can be found in the Care Plan section)  Acute Rehab PT Goals PT Goal Formulation: Patient unable to participate in goal setting Time For Goal Achievement: 02/04/20 Potential to Achieve Goals: Poor (has been in SNF several months)    Frequency Min 1X/week   Barriers to discharge        Co-evaluation PT/OT/SLP Co-Evaluation/Treatment: Yes Reason for Co-Treatment: Complexity of the patient's impairments (multi-system involvement);Necessary to address cognition/behavior during functional activity;To address functional/ADL transfers PT goals addressed during session: Mobility/safety with mobility OT goals addressed during session: ADL's and self-care       AM-PAC PT "6 Clicks" Mobility  Outcome Measure Help needed turning from your back to your side while in a flat bed without using bedrails?: Total Help needed moving from lying on your back to sitting on the side of a flat bed without using bedrails?: Total Help needed moving to and from a bed to a chair (including a wheelchair)?: Total Help needed standing up from a chair using your arms (e.g., wheelchair or bedside chair)?: Total Help needed to walk in hospital room?: Total Help needed climbing 3-5 steps with a railing? : Total 6 Click Score: 6    End of Session Equipment Utilized During Treatment: Oxygen Activity Tolerance: Patient limited by fatigue Patient left: in bed;with call bell/phone within reach;with bed alarm set Nurse Communication: Mobility status;Need for lift equipment PT Visit Diagnosis: Other abnormalities of gait and mobility (R26.89);Muscle weakness (generalized) (M62.81)    Time: 6389-3734 PT Time Calculation (min) (ACUTE ONLY): 17 min   Charges:   PT Evaluation $PT Eval Low Complexity: [place] PT Acute Rehabilitation  Services Pager [phone] Office [phone]   Claretha Cooper 01/21/2020, 1:52 PM

## 2020-01-21 NOTE — Progress Notes (Signed)
Report called to receiving RN. Transported with charge RN to room 1429.

## 2020-01-21 NOTE — TOC Progression Note (Signed)
Transition of Care [facility]) - Progression Note    Patient Details  Name: Becky Hendricks MRN: [id number] Date of Birth: 01-Feb-1946  Transition of Care [facility]) CM/SW Contact  Inga Noller, Olegario Messier, RN Phone Number: 01/21/2020, 1:41 PM  Clinical Narrative:   Confirmed from [facility] rep [facility] aware. Provided our staff w/MAR from [name]         Expected Discharge Plan and Services                                                 Social Determinants of Health (SDOH) Interventions    Readmission Risk Interventions No flowsheet data found.

## 2020-01-21 NOTE — Evaluation (Signed)
Occupational Therapy Evaluation Patient Details Name: Becky Hendricks MRN: [id number] DOB: 08-17-1945 Today's Date: 01/21/2020    History of Present Illness 74 Y/o with PMH of afib, bipolar, HTN, Obesity, DM, Pressure ulcer with recent admission for left buttock abscess s.p drainage , recent  admission for a brief cardiac arrest of uncertain etiology presented 01/19/20  from her nursing home unresponsive and in hypercarbic respiratory failure, extubated 01/20/20.   Clinical Impression   Upon evaluation patient close to her baseline of requiring extensive assistance for mobility and self care. Patient unable to provide much info regarding functioning at SNF however per chart review patient requiring maximal assistance from staff. Will discontinue acute OT at this time, please re-consult if new needs arise.    Follow Up Recommendations  Other (comment) (back to SNF)    Equipment Recommendations  None recommended by OT       Precautions / Restrictions Precautions Precautions: Fall Precaution Comments: sacral wound, stage IV with osteo Restrictions Weight Bearing Restrictions: No      Mobility Bed Mobility Overal bed mobility: Needs Assistance Bed Mobility: Rolling;Supine to Sit;Sit to Supine Rolling: Mod assist   Supine to sit: Max assist;+2 for physical assistance;+2 for safety/equipment Sit to supine: Max assist;+2 for physical assistance;+2 for safety/equipment   General bed mobility comments: patient follows to reach for rails, flexes hip in prep to roll, more assistance  required to roll to the left  Transfers                 General transfer comment: deferred, patient unable to support self seated EOB with posterior lean    Balance Overall balance assessment: Needs assistance Sitting-balance support: Feet supported;Bilateral upper extremity supported Sitting balance-Leahy Scale: Poor Sitting balance - Comments: patient not wanting to sit up , began to lie back down  after about 1 minute. did sit near midline with min assistance Postural control: Posterior lean                                 ADL either performed or assessed with clinical judgement   ADL Overall ADL's : Needs assistance/impaired     Grooming: Maximal assistance   Upper Body Bathing: Maximal assistance;Bed level   Lower Body Bathing: Total assistance;Bed level   Upper Body Dressing : Total assistance;Sitting   Lower Body Dressing: Total assistance;Bed level   Toilet Transfer: Total assistance Toilet Transfer Details (indicate cue type and reason): unable to progress beyond sitting EOB today Toileting- Clothing Manipulation and Hygiene: Total assistance       Functional mobility during ADLs: Total assistance;+2 for physical assistance                    Pertinent Vitals/Pain Pain Assessment: Faces Faces Pain Scale: Hurts little more Pain Location: eyes. 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 Coordination: decreased fine motor;decreased gross motor   Lower Extremity Assessment Lower Extremity Assessment: Defer to PT evaluation        Communication Communication Communication: No difficulties   Cognition Arousal/Alertness: Awake/alert Behavior During Therapy: Anxious Overall Cognitive Status: History of cognitive impairments - at baseline Area of Impairment: Awareness;Safety/judgement;Problem solving  Orientation Level: Place;Time;Situation Current Attention Level: Sustained Memory: Decreased short-term memory Following Commands: Follows one step commands inconsistently Safety/Judgement: Decreased awareness of deficits Awareness: Intellectual Problem  Solving: Slow processing;Requires verbal cues;Requires tactile cues                Home Living Family/patient expects to be discharged to:: Skilled nursing facility                                 Additional Comments: total care, presumably Out of bed via lift,      Prior Functioning/Environment Level of Independence: Needs assistance  Gait / Transfers Assistance Needed: assistance of staff for ADL's and transfers ADL's / Homemaking Assistance Needed: Pt receiveing assistance for ADL at SNF; Need clarification   Comments: Prior to December 2020 admission, pt was living alone in a condo, independent with all ADLs, IADLs, and mobility. Pt did not ambulate with an assistive device (from prior eval) since this time patient has been in/out of SNF and hospital        OT Problem List: Decreased activity tolerance                       Co-evaluation PT/OT/SLP Co-Evaluation/Treatment: Yes Reason for Co-Treatment: Necessary to address cognition/behavior during functional activity;For patient/therapist safety;To address functional/ADL transfers PT goals addressed during session: Mobility/safety with mobility OT goals addressed during session: ADL's and self-care      AM-PAC OT "6 Clicks" Daily Activity     Outcome Measure Help from another person eating meals?: A Lot Help from another person taking care of personal grooming?: A Lot Help from another person toileting, which includes using toliet, bedpan, or urinal?: Total Help from another person bathing (including washing, rinsing, drying)?: A Lot Help from another person to put on and taking off regular upper body clothing?: Total Help from another person to put on and taking off regular lower body clothing?: Total 6 Click Score: 9   End of Session Nurse Communication: Mobility status  Activity Tolerance: Patient limited by fatigue;Other (comment) (patient laying self back down onto EOB) Patient left: in  bed;with call bell/phone within reach;with bed alarm set  OT Visit Diagnosis: Other abnormalities of gait and mobility (R26.89)                Time: 8937-3428 OT Time Calculation (min): 18 min Charges:  OT General Charges $OT Visit: 1 Visit OT Evaluation $OT Eval Low Complexity: 1 Low  Marlyce Huge OT Pager: [phone]  Carmelia Roller 01/21/2020, 2:48 PM

## 2020-01-21 NOTE — TOC Progression Note (Signed)
Transition of Care [facility]) - Progression Note    Patient Details  Name: Becky Hendricks MRN: [id number] Date of Birth: [birth year]  Transition of Care [facility]) CM/SW Contact  Egon Dittus, Olegario Messier, RN Phone Number: 01/21/2020, 1:46 PM  Clinical Narrative:Await PT cons, & recc prior to doing fl2. From SNF-[facility] [name] w/palliative care services.            Expected Discharge Plan and Services                                                 Social Determinants of Health (SDOH) Interventions    Readmission Risk Interventions No flowsheet data found.

## 2020-01-21 NOTE — Progress Notes (Signed)
Writer agrees with previous nurses assessment.

## 2020-01-21 NOTE — Progress Notes (Addendum)
NAME:  Becky Hendricks, MRN:  [id number], DOB:  02/11/1946, LOS: 2 ADMISSION DATE:  01/19/2020, CONSULTATION DATE:  6/8 REFERRING MD:  Zenia Resides, CHIEF COMPLAINT:  Found unresponsive   Brief History   74 y/o female admitted from a SNF after being found unresponsive, hypercarbic.  Has recent history of repeated hospitalizations in setting of osteomyelitis.   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  Significant Hospital Events   6/8 admission  Consults:  PCCM  Procedures:  6/8 ETT >   Significant Diagnostic Tests:  CT head 6/8 > NAICP 12/2019 > LVEF 12-45%, grade 2 diastolic dysfunction, RV dilated, enlarged, elevated PA systolic pressure 80.9 mmHg, dilated left atrium, right atrium dilated  Micro Data:  6/8 sars cov 2 > negative 6/8 blood culture >  6/8 urine culture > candida  Antimicrobials:  6/8 vanc >  6/8 meropenem >    Interim history/subjective:  Resting comfortably Waking up Passing SBT  Objective   Blood pressure 134/73, pulse (!) 56, temperature 98.4 F (36.9 C), temperature source Oral, resp. rate 19, height 5\' 4"  (1.626 m), weight 95.7 kg, SpO2 100 %.        Intake/Output Summary (Last 24 hours) at 01/21/2020 0949 Last data filed at 01/21/2020 0500 Gross per 24 hour  Intake 2028.8 ml  Output 3050 ml  Net -1021.2 ml   Filed Weights   01/19/20 1757 01/20/20 0450 01/21/20 0500  Weight: 95.5 kg 96.8 kg 95.7 kg    Examination: General:  Resting comfortably in bed HENT: NCAT OP clear PULM: Crackles bases B, normal effort CV: RRR, no mgr GI: BS+, soft, nontender MSK: normal bulk and tone Neuro: awake, alert, no distress, MAEW   6/8 CXR > bilateral lower lobe infiltrates and effusions  Resolved Hospital Problem list   Hypophosphatemia Hypokalemia Hypomagnesemia Need for sedation for mechanical  ventilation  Assessment & Plan:  Acute respiratory failure with hypoxemia due to acute pulmonary edema due to decompensated diastolic heart failure; also with WHO group 2 pulmonary hypertension; suspect unasyn contributed to CHF exacerbation (sodium load), not sure if she was taking meds at SNF> need to see MAR Baseline worsening chronic hypercarbic respiratory failure: due to worsening CHF? Diurese again today Finish unasyn Adjust coreg to 12.5mg  with bradycardia Tele contiue losartan Add hydralazine After reviewing MAR from SNF, will need to consult cardiology if she was actually taking her medications  Atrial fibrillation Eliquis Tele  Yeast in urine: contaminant Monitor off antibiotics  Osteomyelitis Concern for HCAP > seems less likely, favor pulmonary edema Stop antibiotics Monitor for fever  DM2 with hyperglycemia Continue glargine and SSI  Seizure disorder Continue depakote   Best practice:  Diet: tube feeding Pain/Anxiety/Delirium protocol (if indicated): yes, as above VAP protocol (if indicated): yes DVT prophylaxis: eliquis GI prophylaxis: famotidine Glucose control: SSI glargine Mobility: bed rest Code Status: full Family Communication: updated brother by phone on 6/8 updated brother by phone on 6/8, he is coming to visit now Disposition: remain in ICU  Labs   CBC: Recent Labs  Lab 01/19/20 1241 01/20/20 0346 01/20/20 1035  WBC 11.1* 7.5 8.2  NEUTROABS 8.8*  --  5.3  HGB 10.7* 8.9* 9.2*  HCT 38.0 31.1* 32.3*  MCV 106.4* 102.3* 101.9*  PLT 184 154 983    Basic Metabolic Panel: Recent Labs  Lab 01/19/20 1241 01/20/20 0346 01/21/20 0500  NA 149* 144 139  K 3.8 2.7* 4.2  CL 82* 86* 86*  CO2 50* 47* 44*  GLUCOSE 245* 210* 115*  BUN 13 18 12   CREATININE 0.43* 0.43* <0.30*  CALCIUM 8.3* 7.8* 8.0*  MG 1.5* 1.3* 2.9*  PHOS 4.6 <1.0* 3.3   GFR: CrCl cannot be calculated (This lab value cannot be used to calculate CrCl because it is not a  number: <0.30). Recent Labs  Lab 01/19/20 1241 01/19/20 1251 01/19/20 1540 01/20/20 0346 01/20/20 1035  WBC 11.1*  --   --  7.5 8.2  LATICACIDVEN  --  2.7* 3.5*  --   --     Liver Function Tests: Recent Labs  Lab 01/19/20 1241 01/21/20 0500  AST 10* 11*  ALT 12 10  ALKPHOS 59 45  BILITOT 0.6 0.6  PROT 6.4* 5.5*  ALBUMIN 2.8* 2.4*   No results for input(s): LIPASE, AMYLASE in the last 168 hours. No results for input(s): AMMONIA in the last 168 hours.  ABG    Component Value Date/Time   PHART 7.397 01/19/2020 1552   PCO2ART 80.8 (HH) 01/19/2020 1552   PO2ART 279 (H) 01/19/2020 1552   HCO3 48.7 (H) 01/19/2020 1552   TCO2 41 (H) 12/22/2019 0820   O2SAT >100.0 01/19/2020 1552     Coagulation Profile: Recent Labs  Lab 01/19/20 1241  INR 1.3*    Cardiac Enzymes: No results for input(s): CKTOTAL, CKMB, CKMBINDEX, TROPONINI in the last 168 hours.  HbA1C: Hgb A1c MFr Bld  Date/Time Value Ref Range Status  12/03/2019 11:20 PM 7.8 (H) 4.8 - 5.6 % Final    Comment:    (NOTE) Pre diabetes:          5.7%-6.4% Diabetes:              >6.4% Glycemic control for   <7.0% adults with diabetes   09/07/2019 07:50 PM 9.5 (H) 4.8 - 5.6 % Final    Comment:    (NOTE) Pre diabetes:          5.7%-6.4% Diabetes:              >6.4% Glycemic control for   <7.0% adults with diabetes     CBG: Recent Labs  Lab 01/20/20 1613 01/20/20 1939 01/21/20 0000 01/21/20 0339 01/21/20 0814  GLUCAP 126* 140* 200* 120* 114*    Critical care time: n/a   03/22/20, MD [place] [facility] Pager: [phone] Cell: [phone] If no response, call [phone]

## 2020-01-22 ENCOUNTER — Inpatient Hospital Stay (HOSPITAL_COMMUNITY): Payer: Medicare Other

## 2020-01-22 DIAGNOSIS — I5032 Chronic diastolic (congestive) heart failure: Secondary | ICD-10-CM | POA: Diagnosis present

## 2020-01-22 DIAGNOSIS — I5033 Acute on chronic diastolic (congestive) heart failure: Secondary | ICD-10-CM

## 2020-01-22 DIAGNOSIS — M86159 Other acute osteomyelitis, unspecified femur: Secondary | ICD-10-CM

## 2020-01-22 DIAGNOSIS — I480 Paroxysmal atrial fibrillation: Secondary | ICD-10-CM

## 2020-01-22 DIAGNOSIS — J810 Acute pulmonary edema: Secondary | ICD-10-CM

## 2020-01-22 LAB — COMPREHENSIVE METABOLIC PANEL
ALT: 15 U/L (ref 0–44)
AST: 12 U/L — ABNORMAL LOW (ref 15–41)
Albumin: 2.4 g/dL — ABNORMAL LOW (ref 3.5–5.0)
Alkaline Phosphatase: 47 U/L (ref 38–126)
Anion gap: 12 (ref 5–15)
BUN: 11 mg/dL (ref 8–23)
CO2: 40 mmol/L — ABNORMAL HIGH (ref 22–32)
Calcium: 8.2 mg/dL — ABNORMAL LOW (ref 8.9–10.3)
Chloride: 85 mmol/L — ABNORMAL LOW (ref 98–111)
Creatinine, Ser: 0.3 mg/dL — ABNORMAL LOW (ref 0.44–1.00)
GFR calc Af Amer: >60 mL/min (ref >60)
GFR calc non Af Amer: >60 mL/min (ref >60)
Glucose, Bld: 170 mg/dL — ABNORMAL HIGH (ref 70–99)
Potassium: 3.8 mmol/L (ref 3.5–5.1)
Sodium: 137 mmol/L (ref 135–145)
Total Bilirubin: 0.6 mg/dL (ref 0.3–1.2)
Total Protein: 5.7 g/dL — ABNORMAL LOW (ref 6.5–8.1)

## 2020-01-22 LAB — GLUCOSE, CAPILLARY
Glucose-Capillary: 132 mg/dL — ABNORMAL HIGH (ref 70–99)
Glucose-Capillary: 202 mg/dL — ABNORMAL HIGH (ref 70–99)
Glucose-Capillary: 159 mg/dL — ABNORMAL HIGH (ref 70–99)
Glucose-Capillary: 237 mg/dL — ABNORMAL HIGH (ref 70–99)

## 2020-01-22 LAB — MAGNESIUM: Magnesium: 2 mg/dL (ref 1.7–2.4)

## 2020-01-22 IMAGING — DX DG CHEST 1V PORT
1 series · 1 of 1 positions shown · non-contrast
Comparison: Radiograph [DATE]

CLINICAL DATA: Acute pulmonary edema

EXAM:
PORTABLE CHEST 1 VIEW

[chest ap]
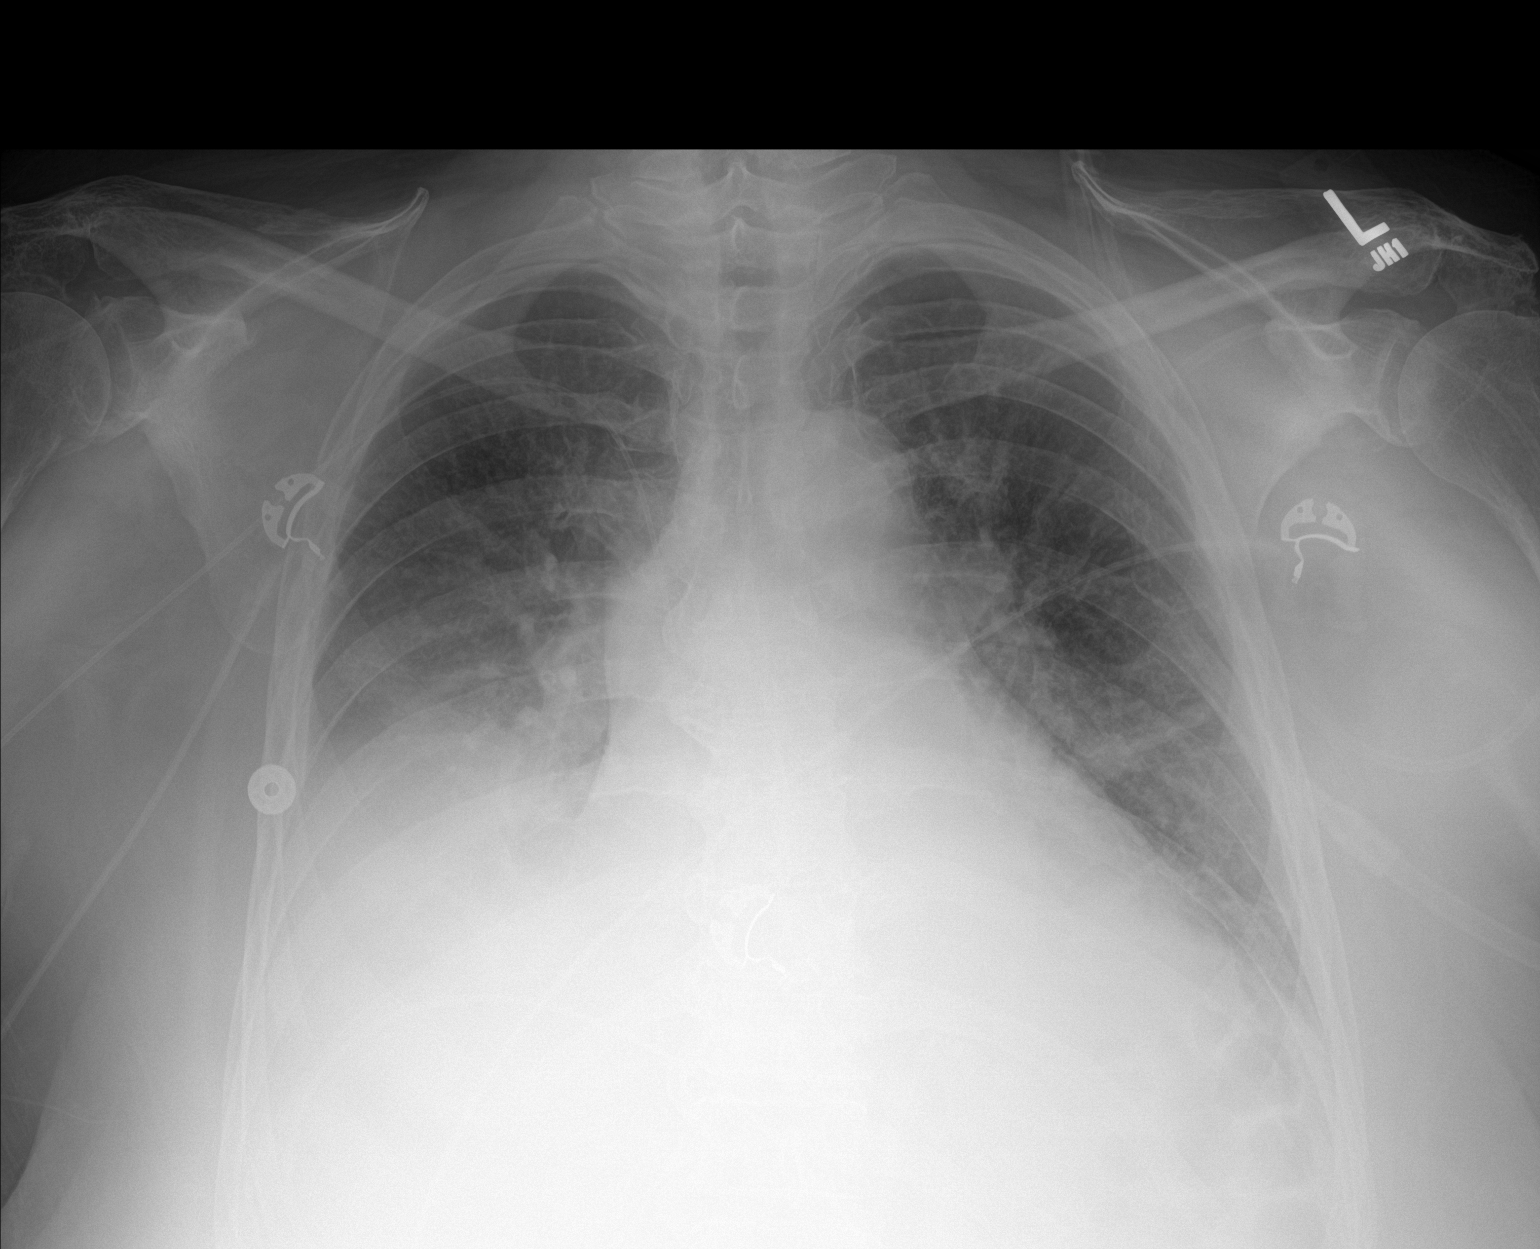

[1 of 1 positions shown; findings below may reference images not displayed]

FINDINGS: Right upper extremity PICC tip terminates at the superior cavoatrial
junction. Telemetry leads overlie the chest. Removal of the
endotracheal and transesophageal tube seen previously.

Gradient density towards the right lung base likely reflecting a
combination of layering effusion and atelectasis. Obscuration of the
left hemidiaphragm suggesting a left effusion as well. Some hazy
opacities could reflect further atelectatic change or edema with
features of central vascular congestion and cardiomegaly. The aorta
is calcified. The remaining cardiomediastinal contours are
unremarkable. No acute osseous or soft tissue abnormality.
Degenerative changes are present in the imaged spine and shoulders.
IMPRESSION: 1. Features suggestive of CHF/volume overload with bilateral
effusions, right greater than left, and features of pulmonary edema
and cardiomegaly. Suspect additional atelectatic changes in the
bases as well.
2. Right upper extremity PICC tip terminates at the superior
cavoatrial junction.
3. Removal of the previously seen transesophageal and endotracheal
tubes.

## 2020-01-22 MED ORDER — FUROSEMIDE 40 MG PO TABS
40.0000 mg | ORAL_TABLET | Freq: Every day | ORAL | Status: DC
  Administered 2020-01-22 – 2020-01-27 (×6): 40 mg via ORAL
  Filled 2020-01-22 (×6): qty 1

## 2020-01-22 MED ORDER — ATORVASTATIN CALCIUM 10 MG PO TABS
10.0000 mg | ORAL_TABLET | Freq: Every day | ORAL | Status: DC
  Administered 2020-01-22 – 2020-01-26 (×5): 10 mg via ORAL
  Filled 2020-01-22 (×5): qty 1

## 2020-01-22 NOTE — Progress Notes (Signed)
PROGRESS NOTE    Becky Hendricks  ACZ:[id number] DOB: Jan 13, 1946 DOA: 01/19/2020 PCP: Roderic Scarce, MD   Brief Narrative: Becky Hendricks is a 74 y.o. female with a history of diastolic heart failure, hypertension, atrial fibrillation, osteomyelitis of the coccyx, diabetes mellitus, type 2. Patient presented secondary to mental status change and found to have acute respiratory with hypoxia and hypercapnia. Patient required intubation and ICU admission for treatment using mechanical ventilation.    Assessment & Plan:   Active Problems:   AF (paroxysmal atrial fibrillation) (HCC)   Acute osteomyelitis of pelvic region and thigh (HCC)   Acute respiratory failure with hypoxemia (HCC)   Acute pulmonary edema (HCC)   Acute on chronic diastolic CHF (congestive heart failure) (HCC)   Acute respiratory failure with hypoxia and hypercapnia Present on admission and secondary to acute heart failure. Associated respiratory acidosis. PCO2 greater than 120 on admission with an associated pH of 7.2. Patient required intubation from 5/9 until 5/11. Currently on 2L via [place]. -Wean to room air as able  Acute on chronic diastolic heart failure Last recorded EF of 65-70%. Patient is on Coreg and Lasix as an outpatient. While inpatient, patient diuresed with Lasix IV. Currently net negative about 4.49 L. -Restart home Lasix 40 mg daily and continue Coreg  Acute metabolic encephalopathy Secondary to hypercarbia. Management above  Paroxysmal atrial fibrillation Patient is on Coreg as an outpatient -Continue Eliquis and Coreg  Osteomyelitis of the coccyx Present on admission. Patient was on Vancomycin/Unasyn as an outpatient. Last planned day of antibiotics was 6/10. Patient has completed antibiotic course.  Diabetes mellitus, type 2 Patient is on Janumet, Lantus 8 units daily and Humalog sliding scale as an outpatient -Continue Lantus 15 units qHS and SSI while inpatient  Seizure disorder -Continue  Depakote  Hyperlipidemia Patient is on Lipitor as an outpatient  Essential hypertension Patient is on losartan as an outpatient  Pressure injury Mid sacrum. POA   DVT prophylaxis: Eliquis Code Status:   Code Status: Full Code Family Communication: None at bedside Disposition Plan: Discharge back to SNF likely in 24 hours if remains stable on oxygen, pending PT evaluation   Consultants:   PCCM  Procedures:   None  Antimicrobials:  Vancomycin  Unasyn    Subjective: Wants to get up out of bed.  Objective: Vitals:   01/21/20 2119 01/22/20 0127 01/22/20 0525 01/22/20 0554  BP: (!) 122/94 (!) 142/75 (!) 175/98   Pulse: 81 76 76   Resp: 20 20 20    Temp: 98.4 F (36.9 C) 99.3 F (37.4 C) 99.3 F (37.4 C)   TempSrc: Oral Oral Oral   SpO2: 92% 92% 91%   Weight:    102 kg  Height:    5\' 6"  (1.676 m)    Intake/Output Summary (Last 24 hours) at 01/22/2020 1301 Last data filed at 01/22/2020 0556 Gross per 24 hour  Intake 240 ml  Output 4575 ml  Net -4335 ml   Filed Weights   01/20/20 0450 01/21/20 0500 01/22/20 0554  Weight: 96.8 kg 95.7 kg 102 kg    Examination:  General exam: Appears calm and comfortable Respiratory system: Diminished. Respiratory effort normal. Cardiovascular system: S1 & S2 heard, RRR. No murmurs, rubs, gallops or clicks. Gastrointestinal system: Abdomen is nondistended, soft and nontender. No organomegaly or masses felt. Normal bowel sounds heard. Central nervous system: Alert and oriented to person and place. No focal neurological deficits. Musculoskeletal: No calf tenderness Skin: No cyanosis. No rashes Psychiatry: Judgement and insight appear  slightly imparied    Data Reviewed: I have personally reviewed following labs and imaging studies  CBC Lab Results  Component Value Date   WBC 8.2 01/20/2020   RBC 3.17 (L) 01/20/2020   HGB 9.2 (L) 01/20/2020   HCT 32.3 (L) 01/20/2020   MCV 101.9 (H) 01/20/2020   MCH 29.0 01/20/2020     PLT 150 01/20/2020   MCHC 28.5 (L) 01/20/2020   RDW 17.2 (H) 01/20/2020   LYMPHSABS 1.8 01/20/2020   MONOABS 0.7 01/20/2020   EOSABS 0.3 01/20/2020   BASOSABS 0.0 23/53/6144     Last metabolic panel Lab Results  Component Value Date   NA 137 01/22/2020   K 3.8 01/22/2020   CL 85 (L) 01/22/2020   CO2 40 (H) 01/22/2020   BUN 11 01/22/2020   CREATININE 0.30 (L) 01/22/2020   GLUCOSE 170 (H) 01/22/2020   GFRNONAA >60 01/22/2020   GFRAA >60 01/22/2020   CALCIUM 8.2 (L) 01/22/2020   PHOS 3.3 01/21/2020   PROT 5.7 (L) 01/22/2020   ALBUMIN 2.4 (L) 01/22/2020   BILITOT 0.6 01/22/2020   ALKPHOS 47 01/22/2020   AST 12 (L) 01/22/2020   ALT 15 01/22/2020   ANIONGAP 12 01/22/2020    CBG (last 3)  Recent Labs    01/21/20 2126 01/22/20 0741 01/22/20 1218  GLUCAP 176* 132* 202*     GFR: Estimated Creatinine Clearance: 76.7 mL/min (A) (by C-G formula based on SCr of 0.3 mg/dL (L)).  Coagulation Profile: Recent Labs  Lab 01/19/20 1241  INR 1.3*    Recent Results (from the past 240 hour(s))  Urine culture     Status: Abnormal   Collection Time: 01/19/20 12:41 PM   Specimen: In/Out Cath Urine  Result Value Ref Range Status   Specimen Description   Final    IN/OUT CATH URINE Performed at [facility] [address] [place], [place] [address]    Special Requests   Final    NONE Performed at [facility], [place] [address] [place], [place] [address]    Culture >=100,000 COLONIES/mL YEAST (A)  Final   Report Status 01/20/2020 FINAL  Final  SARS Coronavirus 2 by RT PCR (hospital order, performed in [facility] hospital lab) Nasopharyngeal Nasopharyngeal Swab     Status: None   Collection Time: 01/19/20 12:42 PM   Specimen: Nasopharyngeal Swab  Result Value Ref Range Status   SARS Coronavirus 2 NEGATIVE NEGATIVE Final    Comment: (NOTE) SARS-CoV-2 target nucleic acids are NOT DETECTED. The SARS-CoV-2 RNA is generally detectable in  upper and lower respiratory specimens during the acute phase of infection. The lowest concentration of SARS-CoV-2 viral copies this assay can detect is 250 copies / mL. A negative result does not preclude SARS-CoV-2 infection and should not be used as the sole basis for treatment or other patient management decisions.  A negative result may occur with improper specimen collection / handling, submission of specimen other than nasopharyngeal swab, presence of viral mutation(s) within the areas targeted by this assay, and inadequate number of viral copies (<250 copies / mL). A negative result must be combined with clinical observations, patient history, and epidemiological information. Fact Sheet for Patients:   StrictlyIdeas.no Fact Sheet for Healthcare Providers: BankingDealers.co.za This test is not yet approved or cleared  by the [place] FDA and has been authorized for detection and/or diagnosis of SARS-CoV-2 by FDA under an Emergency Use Authorization (EUA).  This EUA will remain in effect (meaning this  test can be used) for the duration of the COVID-19 declaration under Section 564(b)(1) of the Act, 21 U.S.C. section 360bbb-3(b)(1), unless the authorization is terminated or revoked sooner. Performed at [facility], [address], [place], [place] [address]   Blood Culture (routine x 2)     Status: None (Preliminary result)   Collection Time: 01/19/20 12:46 PM   Specimen: BLOOD  Result Value Ref Range Status   Specimen Description   Final    BLOOD LEFT ANTECUBITAL Performed at [facility], [address] [place], [place] [address]    Special Requests   Final    BOTTLES DRAWN AEROBIC AND ANAEROBIC Blood Culture results may not be optimal due to an inadequate volume of blood received in culture bottles Performed at [facility], [address] [place], [place]  [address]    Culture   Final    NO GROWTH 3 DAYS Performed at [facility], [address] [place], [place] [address]    Report Status PENDING  Incomplete  Blood Culture (routine x 2)     Status: None (Preliminary result)   Collection Time: 01/19/20 12:51 PM   Specimen: BLOOD  Result Value Ref Range Status   Specimen Description   Final    BLOOD RIGHT ANTECUBITAL Performed at [facility], [address], [place], [place] [address]    Special Requests   Final    BOTTLES DRAWN AEROBIC AND ANAEROBIC Blood Culture adequate volume Performed at [facility], [address], [place], [place] [address]    Culture   Final    NO GROWTH 3 DAYS Performed at [facility], [address] [place], [place] [address]    Report Status PENDING  Incomplete  MRSA PCR Screening     Status: None   Collection Time: 01/19/20  4:00 PM   Specimen: Nasopharyngeal  Result Value Ref Range Status   MRSA by PCR NEGATIVE NEGATIVE Final    Comment:        The GeneXpert MRSA Assay (FDA approved for NASAL specimens only), is one component of a comprehensive MRSA colonization surveillance program. It is not intended to diagnose MRSA infection nor to guide or monitor treatment for MRSA infections. Performed at [facility], [address] [place], [place] [address]         Radiology Studies: DG Chest Port 1 View  Result Date: 01/22/2020 CLINICAL DATA:  Acute pulmonary edema EXAM: PORTABLE CHEST 1 VIEW COMPARISON:  Radiograph 01/20/2020 FINDINGS: Right upper extremity PICC tip terminates at the superior cavoatrial junction. Telemetry leads overlie the chest. Removal of the endotracheal and transesophageal tube seen previously. Gradient density towards the right lung base likely reflecting a combination of layering effusion and atelectasis. Obscuration of the left hemidiaphragm suggesting a left effusion as well. Some hazy opacities could  reflect further atelectatic change or edema with features of central vascular congestion and cardiomegaly. The aorta is calcified. The remaining cardiomediastinal contours are unremarkable. No acute osseous or soft tissue abnormality. Degenerative changes are present in the imaged spine and shoulders. IMPRESSION: 1. Features suggestive of CHF/volume overload with bilateral effusions, right greater than left, and features of pulmonary edema and cardiomegaly. Suspect additional atelectatic changes in the bases as well. 2. Right upper extremity PICC tip terminates at the superior cavoatrial junction. 3. Removal of the previously seen transesophageal and endotracheal tubes. Electronically Signed   By: Coralie Keens.D.  On: 01/22/2020 07:11        Scheduled Meds: . apixaban  5 mg Oral BID  . carvedilol  12.5 mg Oral BID WC  . divalproex  500 mg Oral Q12H  . hydrALAZINE  25 mg Oral Q8H  . insulin aspart  0-15 Units Subcutaneous TID WC  . insulin aspart  0-5 Units Subcutaneous QHS  . insulin glargine  15 Units Subcutaneous QHS  . losartan  25 mg Oral Daily  . mouth rinse  15 mL Mouth Rinse BID  . multivitamin with minerals  1 tablet Oral Daily  . sodium chloride flush  10-40 mL Intracatheter Q12H   Continuous Infusions:   LOS: 3 days     Jacquelin Hawking, MD [facility] 01/22/2020, 1:01 PM  If 7PM-7AM, please contact night-coverage [email or web address]

## 2020-01-22 NOTE — Care Management Important Message (Signed)
Important Message  Patient Details IM Letter given to Windell Moulding SW Case Manager to present to the Patient Name: Becky Hendricks MRN: [id number] Date of Birth: 1946-08-06   Medicare Important Message Given:  Yes     Caren Macadam 01/22/2020, 11:57 AM

## 2020-01-23 LAB — GLUCOSE, CAPILLARY
Glucose-Capillary: 159 mg/dL — ABNORMAL HIGH (ref 70–99)
Glucose-Capillary: 233 mg/dL — ABNORMAL HIGH (ref 70–99)
Glucose-Capillary: 164 mg/dL — ABNORMAL HIGH (ref 70–99)
Glucose-Capillary: 188 mg/dL — ABNORMAL HIGH (ref 70–99)

## 2020-01-23 LAB — SARS CORONAVIRUS 2 BY RT PCR (HOSPITAL ORDER, PERFORMED IN ~~LOC~~ HOSPITAL LAB): SARS Coronavirus 2: NEGATIVE

## 2020-01-23 MED ORDER — INSULIN GLARGINE 100 UNIT/ML ~~LOC~~ SOLN: 15.0000 [IU] | Freq: Every day | SUBCUTANEOUS | Status: DC

## 2020-01-23 MED ORDER — CARVEDILOL 12.5 MG PO TABS: 12.5000 mg | ORAL_TABLET | Freq: Two times a day (BID) | ORAL | Status: DC

## 2020-01-23 MED ORDER — LOSARTAN POTASSIUM 50 MG PO TABS: 50.0000 mg | ORAL_TABLET | Freq: Every day | ORAL | Status: DC

## 2020-01-23 MED ORDER — HYDRALAZINE HCL 25 MG PO TABS: 25.0000 mg | ORAL_TABLET | Freq: Three times a day (TID) | ORAL | Status: DC

## 2020-01-23 MED ORDER — LOSARTAN POTASSIUM 50 MG PO TABS
50.0000 mg | ORAL_TABLET | Freq: Every day | ORAL | Status: DC
  Administered 2020-01-23 – 2020-01-27 (×5): 50 mg via ORAL
  Filled 2020-01-23 (×5): qty 1

## 2020-01-23 NOTE — TOC Progression Note (Signed)
Transition of Care [facility]) - Progression Note    Patient Details  Name: Becky Hendricks MRN: [id number] Date of Birth: 20-May-1946  [facility] CM/SW Contact  Darleene Cleaver, [place] Phone Number: 01/23/2020, 2:05 PM  Clinical Narrative:     CSW spoke to [facility], and they said patient is actually short term rehab and patient is using her insurance for rehab.  CSW also found out that patient's [employer] has expired as of June 9th.  Patient needs a new [employer] number, CSW faxed clinicals to [employer] receive number.  CSW also contacted [facility] to start insurance authorization, reference number [id number].  CSW faxed clincal information to both [employer] and [facility] [employer].  CSW awaiting [employer] number and insurance authorization.  CSW updated physician and bedside nurse, that patient will most likely not be able to discharge today.    Expected Discharge Plan and Services           Expected Discharge Date: 01/23/20                                     Social Determinants of Health (SDOH) Interventions    Readmission Risk Interventions No flowsheet data found.

## 2020-01-23 NOTE — Discharge Summary (Signed)
Physician Discharge Summary  Antionetta Ator EAV:[id number] DOB: December 04, 1945 DOA: 01/19/2020  PCP: Roderic Scarce, MD  Admit date: 01/19/2020 Discharge date: 01/23/2020  Admitted From: SNF Disposition: SNF  Recommendations for Outpatient Follow-up:  1. Follow up with PCP in 1 week 2. Please obtain BMP/CBC in one week 3. Please follow up on the following pending results: None  Discharge Condition: Stable CODE STATUS: Full code Diet recommendation: Heart healthy   Brief/Interim Summary:  Admission HPI written by Zenia Resides, NP   Brief History  74 year old female being treated at skilled nursing facility for osteomyelitis.  Found with sudden mental status change, on arrival to the ER PCO2 greater than 120 with acute on chronic hypercarbic respiratory failure, chest x-ray with right greater than left airspace disease with element of right effusion critical care asked to admit.     Hospital course:  Acute respiratory failure with hypercapnia Acute on chronic respiratory failure with hypoxia Present on admission and secondary to acute heart failure. Associated respiratory acidosis. PCO2 greater than 120 on admission with an associated pH of 7.2. Patient required intubation from 5/9 until 5/11. Currently on 2L via [place]. Patient is on chronic oxygen at home.  Acute on chronic diastolic heart failure Last recorded EF of 65-70%. Patient is on Coreg and Lasix as an outpatient. While inpatient, patient diuresed with Lasix IV. Currently net negative about 4.49 L. Continue Lasix and Coreg.  Acute metabolic encephalopathy Secondary to hypercarbia. Management above  Paroxysmal atrial fibrillation Patient is on Coreg as an outpatient. Continue Eliquis and Coreg which has decreased to Coreg 12.5 mg BID secondary to bradycardia episodes in the ICU.  Osteomyelitis of the coccyx Present on admission. Patient was on Vancomycin/Unasyn as an outpatient. Last planned day of antibiotics was  6/10. Patient has completed antibiotic course.  Diabetes mellitus, type 2 Patient is on Janumet, Lantus 8 units daily and Humalog sliding scale as an outpatient. Lantus increased to 15 units on discharge.  Seizure disorder Continue Depakote  Hyperlipidemia Patient is on Lipitor as an outpatient  Essential hypertension Patient is on losartan as an outpatient  Pressure injury Mid sacrum. POA  Discharge Diagnoses:  Active Problems:   AF (paroxysmal atrial fibrillation) (HCC)   Acute osteomyelitis of pelvic region and thigh (HCC)   Acute respiratory failure with hypoxemia (HCC)   Acute pulmonary edema (HCC)   Acute on chronic diastolic CHF (congestive heart failure) (HCC)    Discharge Instructions   Allergies as of 01/23/2020      Reactions   Chlorhexidine Gluconate Itching   Acetazolamide Er Rash      Medication List    STOP taking these medications   ampicillin-sulbactam  IVPB Commonly known as: UNASYN   Ensure Max Protein Liqd   predniSONE 20 MG tablet Commonly known as: DELTASONE   vancomycin  IVPB     TAKE these medications   acetaminophen 325 MG tablet Commonly known as: TYLENOL Take 650 mg by mouth every 6 (six) hours as needed for mild pain or headache.   apixaban 5 MG Tabs tablet Commonly known as: ELIQUIS Take 5 mg by mouth 2 (two) times daily.   ascorbic acid 1000 MG tablet Commonly known as: VITAMIN C Take 500 mg by mouth 2 (two) times daily.   atorvastatin 10 MG tablet Commonly known as: LIPITOR Take 10 mg by mouth at bedtime.   calcium carbonate 500 MG chewable tablet Commonly known as: TUMS - dosed in mg elemental calcium Chew 1 tablet by mouth  daily.   carvedilol 12.5 MG tablet Commonly known as: COREG Take 1 tablet (12.5 mg total) by mouth 2 (two) times daily with a meal. What changed:   medication strength  how much to take   diphenhydrAMINE-zinc acetate cream Commonly known as: BENADRYL Apply 1 application topically  3 (three) times daily as needed for itching.   divalproex 500 MG DR tablet Commonly known as: DEPAKOTE Take 1 tablet (500 mg total) by mouth 2 (two) times daily.   docusate sodium 100 MG capsule Commonly known as: COLACE Take 1 capsule (100 mg total) by mouth 2 (two) times daily as needed for mild constipation.   famotidine 20 MG tablet Commonly known as: PEPCID Take 20 mg by mouth 2 (two) times daily.   feeding supplement (PRO-STAT SUGAR FREE 64) Liqd Take 30 mLs by mouth 2 (two) times daily.   furosemide 40 MG tablet Commonly known as: LASIX Take 1 tablet (40 mg total) by mouth daily.   HumaLOG 100 UNIT/ML cartridge Generic drug: insulin lispro Inject into the skin See admin instructions. Inject into the skin 2 times a day between meals, per sliding scale: BGL 0-200 = give nothing; 201-250 = 2 units; 251-300 = 4 units; 301-350 = 6 units; 351-400 = 8 units; 401-450 = 10 units; 451-500 = 12 units; >450 = 12 units and CALL PROVIDER IF STILL ELEVATED AFTER 2 HOURS   hydrALAZINE 25 MG tablet Commonly known as: APRESOLINE Take 1 tablet (25 mg total) by mouth every 8 (eight) hours.   hydrocortisone cream 1 % Apply topically 2 (two) times daily.   insulin glargine 100 UNIT/ML injection Commonly known as: LANTUS Inject 0.15 mLs (15 Units total) into the skin at bedtime. What changed:   how much to take  when to take this   Ipratropium-Albuterol 20-100 MCG/ACT Aers respimat Commonly known as: COMBIVENT Inhale 1 puff into the lungs every 6 (six) hours. What changed:   when to take this  reasons to take this   Janumet 50-1000 MG tablet Generic drug: sitaGLIPtin-metformin Take 1 tablet by mouth 2 (two) times daily.   losartan 50 MG tablet Commonly known as: COZAAR Take 1 tablet (50 mg total) by mouth daily. Start taking on: January 24, 2020 What changed:   medication strength  how much to take   multivitamin tablet Take 1 tablet by mouth daily.   pantoprazole 40  MG tablet Commonly known as: PROTONIX Take 1 tablet (40 mg total) by mouth 2 (two) times daily.   sennosides-docusate sodium 8.6-50 MG tablet Commonly known as: SENOKOT-S Take 1 tablet by mouth daily.   Vitamin D 125 MCG (5000 UT) Caps Take 10,000 Units by mouth once a week. Wednesday   Zinc Gluconate 100 MG Tabs Take 220 mg by mouth daily.       Allergies  Allergen Reactions  . Chlorhexidine Gluconate Itching  . Acetazolamide Er Rash    Consultations:  Critical care medicine   Procedures/Studies: DG Abd 1 View  Result Date: 01/19/2020 CLINICAL DATA:  Intubated, enteric catheter placement EXAM: ABDOMEN - 1 VIEW COMPARISON:  12/31/2019 FINDINGS: Frontal view of the lower chest and upper abdomen demonstrates an enteric catheter along the expected course of the stomach. Side port projects over the gastric antrum/pylorus. Distal tip is excluded by collimation, likely within the proximal duodenum. Bowel gas pattern is unremarkable. Patchy right basilar consolidation and/or effusion is noted. Right-sided PICC projects over the superior vena cava. IMPRESSION: 1. Enteric catheter as above, side port projecting over  gastric pylorus. Tip is excluded, likely within the proximal duodenum. Electronically Signed   By: Randa Ngo M.D.   On: 01/19/2020 18:41   DG Abd 1 View  Result Date: 12/31/2019 CLINICAL DATA:  History of AFib. EXAM: ABDOMEN - 1 VIEW COMPARISON:  None. FINDINGS: Patient is rotated to the right. Bowel gas pattern is nonobstructive with mild fecal retention throughout the colon. No free peritoneal air. Degenerative change of the spine. Calcification over the right pelvis likely phleboliths. IMPRESSION: Nonobstructive bowel gas pattern with mild fecal retention throughout the colon. Electronically Signed   By: Marin Olp M.D.   On: 12/31/2019 10:53   CT Head Wo Contrast  Result Date: 01/19/2020 CLINICAL DATA:  Altered mental status EXAM: CT HEAD WITHOUT CONTRAST  TECHNIQUE: Contiguous axial images were obtained from the base of the skull through the vertex without intravenous contrast. COMPARISON:  Dec 20, 2019 FINDINGS: Brain: There is mild diffuse atrophy. There is no intracranial mass, hemorrhage, extra-axial fluid collection, or midline shift. There is patchy small vessel disease in the centra semiovale bilaterally. No acute appearing infarct evident. Vascular: No hyperdense vessel. No appreciable vascular calcification. Skull: The bony calvarium appears intact. Sinuses/Orbits: There is mucosal thickening in several ethmoid air cells. Other paranasal sinuses are clear. Orbits appear symmetric bilaterally. Other: Mastoid air cells are clear. IMPRESSION: Stable atrophy with periventricular small vessel disease. No acute infarct evident. No mass or hemorrhage. Mucosal thickening noted in several ethmoid air cells. Electronically Signed   By: Lowella Grip III M.D.   On: 01/19/2020 13:39   MR BRAIN WO CONTRAST  Result Date: 12/24/2019 CLINICAL DATA:  Encephalopathy. EXAM: MRI HEAD WITHOUT CONTRAST TECHNIQUE: Multiplanar, multiecho pulse sequences of the brain and surrounding structures were obtained without intravenous contrast. COMPARISON:  Head CT Dec 20, 2019 FINDINGS: The study is partially degraded by motion Brain: No acute infarction, hemorrhage, hydrocephalus, extra-axial collection or mass lesion. Scattered foci of T2 hyperintensity are seen within the white matter of the cerebral hemispheres, predominantly periventricular, nonspecific. There is prominence of the supratentorial ventricles, cerebral and cerebellar sulci, reflecting parenchymal volume loss. Vascular: Normal flow voids. Skull and upper cervical spine: Normal marrow signal. Sinuses/Orbits: Fluid level within the right sphenoid sinus mild mucosal thickening of the bilateral ethmoid cells. The orbits are maintained. Other: Bilateral mastoid effusions, right greater than left. IMPRESSION: 1. No acute  intracranial abnormality. 2. Moderate to advanced parenchymal volume loss and mild chronic microvascular ischemic changes. 3. Fluid level within the right sphenoid sinus. Correlate clinically for acute sinusitis. 4. Bilateral mastoid effusions, right greater than left. Electronically Signed   By: Pedro Earls M.D.   On: 12/24/2019 15:10   DG Chest Port 1 View  Result Date: 01/22/2020 CLINICAL DATA:  Acute pulmonary edema EXAM: PORTABLE CHEST 1 VIEW COMPARISON:  Radiograph 01/20/2020 FINDINGS: Right upper extremity PICC tip terminates at the superior cavoatrial junction. Telemetry leads overlie the chest. Removal of the endotracheal and transesophageal tube seen previously. Gradient density towards the right lung base likely reflecting a combination of layering effusion and atelectasis. Obscuration of the left hemidiaphragm suggesting a left effusion as well. Some hazy opacities could reflect further atelectatic change or edema with features of central vascular congestion and cardiomegaly. The aorta is calcified. The remaining cardiomediastinal contours are unremarkable. No acute osseous or soft tissue abnormality. Degenerative changes are present in the imaged spine and shoulders. IMPRESSION: 1. Features suggestive of CHF/volume overload with bilateral effusions, right greater than left, and features of pulmonary  edema and cardiomegaly. Suspect additional atelectatic changes in the bases as well. 2. Right upper extremity PICC tip terminates at the superior cavoatrial junction. 3. Removal of the previously seen transesophageal and endotracheal tubes. Electronically Signed   By: Kreg Shropshire M.D.   On: 01/22/2020 07:11   DG Chest Port 1 View  Result Date: 01/20/2020 CLINICAL DATA:  Acute respiratory failure with hypoxemia EXAM: PORTABLE CHEST 1 VIEW COMPARISON:  Radiograph 01/19/2020, CT 12/24/2019 FINDINGS: Endotracheal tube tip is now positioned low within the trachea, 1.5 cm from the carina.  Consider retraction 2 cm to the mid trachea. Transesophageal tube tip distal to the GE junction, below the level of imaging. Telemetry leads overlie the chest. Central pulmonary vascular congestion, similar to prior. Some hazy opacities towards the bases likely reflecting atelectatic change with more patchy consolidative opacity in the right lower lung which could reflect a combination of layering pleural fluid and passive atelectasis though underlying consolidation is difficult to exclude. Suspect some left pleural effusion as well with an indistinct left hemidiaphragm. No visible pneumothorax. Cardiomediastinal contours are stable. No acute osseous or soft tissue abnormality. Degenerative changes are present in the imaged spine and shoulders. IMPRESSION: 1. Endotracheal tube tip is now low within the trachea, 1.5 cm from the carina. Consider retraction 2 cm to the mid trachea. 2. Stable central pulmonary vascular congestion. 3. Bibasilar atelectatic change with more patchy consolidative opacity in the right lower lung which could reflect a combination of layering pleural fluid and passive atelectasis though underlying consolidation is difficult to exclude. Electronically Signed   By: Kreg Shropshire M.D.   On: 01/20/2020 06:26   DG Chest Port 1 View  Result Date: 01/19/2020 CLINICAL DATA:  Status post intubation. EXAM: PORTABLE CHEST 1 VIEW COMPARISON:  January 19, 2020 (1:03 p.m.) FINDINGS: An endotracheal tube is seen with its distal tip approximately 4.3 cm from the carina. There is stable right-sided PICC line positioning. Very mild pulmonary vascular congestion is noted which is decreased in severity when compared to the prior study. Mild, stable bibasilar atelectasis and/or infiltrate is seen. A small right pleural effusion is noted. No pneumothorax is identified. The heart size and mediastinal contours are within normal limits. Degenerative changes seen throughout the thoracic spine. IMPRESSION: 1. Endotracheal  tube, with its distal tip approximately 4.3 cm from the carina. 2. Very mild pulmonary vascular congestion which is decreased in severity when compared to the prior study. Electronically Signed   By: Aram Candela M.D.   On: 01/19/2020 15:24   DG Chest Port 1 View  Result Date: 01/19/2020 CLINICAL DATA:  Shortness of breath with altered mental status EXAM: PORTABLE CHEST 1 VIEW COMPARISON:  Dec 29, 2019 FINDINGS: There is cardiomegaly with pulmonary venous hypertension. There are pleural effusions bilaterally, larger on the right than the left. There is perihilar interstitial prominence, likely edema. There is ill-defined airspace opacity in the right base. No appreciable adenopathy. Central catheter tip is in the superior vena cava. No pneumothorax. No bone lesions. IMPRESSION: There is cardiomegaly with pulmonary vascular congestion. Right pleural effusion with mild perihilar interstitial edema. Airspace opacity in the right base may represent alveolar edema or pneumonia. The overall appearance is concerning for a degree of congestive heart failure, potentially with pneumonia in the right base. Central catheter tip in superior vena cava without pneumothorax. Electronically Signed   By: Bretta Bang III M.D.   On: 01/19/2020 13:37   DG CHEST PORT 1 VIEW  Result Date: 12/29/2019 CLINICAL DATA:  Shortness of breath EXAM: PORTABLE CHEST 1 VIEW COMPARISON:  12/21/2019 FINDINGS: Interval extubation. Enteric tube is no longer present. Central line is no longer present. Low lung volumes with persistent elevation of the right hemidiaphragm. Probable persistent right pleural effusion and right basilar atelectasis. Stable cardiomediastinal contours. IMPRESSION: Persistent elevation of the right hemidiaphragm with probable right pleural effusion and right basilar atelectasis. Electronically Signed   By: Guadlupe Spanish M.D.   On: 12/29/2019 09:59   [place] EKG SITE RITE  Result Date: 01/04/2020 If Site Rite  image not attached, placement could not be confirmed due to current cardiac rhythm.  [place] EKG SITE RITE  Result Date: 12/27/2019 If Site Rite image not attached, placement could not be confirmed due to current cardiac rhythm.     Subjective: No issues today  Discharge Exam: Vitals:   01/23/20 0636 01/23/20 0704  BP: (!) 164/144 (!) 178/91  Pulse: 74 74  Resp: 20   Temp: 97.7 F (36.5 C)   SpO2: 93%    Vitals:   01/22/20 1357 01/22/20 2141 01/23/20 0636 01/23/20 0704  BP: (!) 164/83 (!) 153/68 (!) 164/144 (!) 178/91  Pulse: 78 66 74 74  Resp:  20 20   Temp: 98.8 F (37.1 C) 98.8 F (37.1 C) 97.7 F (36.5 C)   TempSrc: Oral Oral Oral   SpO2:  98% 93%   Weight:      Height:        General: Pt is alert, awake, not in acute distress Cardiovascular: RRR, S1/S2 +, no rubs, no gallops Respiratory: CTA bilaterally, no wheezing, no rhonchi Abdominal: Soft, NT, ND, bowel sounds + Extremities:  no cyanosis    The results of significant diagnostics from this hospitalization (including imaging, microbiology, ancillary and laboratory) are listed below for reference.     Microbiology: Recent Results (from the past 240 hour(s))  Urine culture     Status: Abnormal   Collection Time: 01/19/20 12:41 PM   Specimen: In/Out Cath Urine  Result Value Ref Range Status   Specimen Description   Final    IN/OUT CATH URINE Performed at [facility], [address], [place], [place] [address]    Special Requests   Final    NONE Performed at [facility], [address], [place], [place] [address]    Culture >=100,000 COLONIES/mL YEAST (A)  Final   Report Status 01/20/2020 FINAL  Final  SARS Coronavirus 2 by RT PCR (hospital order, performed in [facility] hospital lab) Nasopharyngeal Nasopharyngeal Swab     Status: None   Collection Time: 01/19/20 12:42 PM   Specimen: Nasopharyngeal Swab  Result Value Ref Range Status   SARS Coronavirus 2  NEGATIVE NEGATIVE Final    Comment: (NOTE) SARS-CoV-2 target nucleic acids are NOT DETECTED. The SARS-CoV-2 RNA is generally detectable in upper and lower respiratory specimens during the acute phase of infection. The lowest concentration of SARS-CoV-2 viral copies this assay can detect is 250 copies / mL. A negative result does not preclude SARS-CoV-2 infection and should not be used as the sole basis for treatment or other patient management decisions.  A negative result may occur with improper specimen collection / handling, submission of specimen other than nasopharyngeal swab, presence of viral mutation(s) within the areas targeted by this assay, and inadequate number of viral copies (<250 copies / mL). A negative result must be combined with clinical observations, patient history, and epidemiological information. Fact Sheet for Patients:   BoilerBrush.com.cy Fact Sheet  for Healthcare Providers: https://pope.com/https://www.fda.gov/media/136313/download This test is not yet approved or cleared  by the [place] FDA and has been authorized for detection and/or diagnosis of SARS-CoV-2 by FDA under an Emergency Use Authorization (EUA).  This EUA will remain in effect (meaning this test can be used) for the duration of the COVID-19 declaration under Section 564(b)(1) of the Act, 21 U.S.C. section 360bbb-3(b)(1), unless the authorization is terminated or revoked sooner. Performed at [facility], [address] [place], [place] [address]   Blood Culture (routine x 2)     Status: None (Preliminary result)   Collection Time: 01/19/20 12:46 PM   Specimen: BLOOD  Result Value Ref Range Status   Specimen Description   Final    BLOOD LEFT ANTECUBITAL Performed at [facility], [address] [place], [place] [address]    Special Requests   Final    BOTTLES DRAWN AEROBIC AND ANAEROBIC Blood Culture results may not be optimal due to an  inadequate volume of blood received in culture bottles Performed at [facility], [address] [place], [place] [address]    Culture   Final    NO GROWTH 4 DAYS Performed at [facility], [address] [place], [place] [address]    Report Status PENDING  Incomplete  Blood Culture (routine x 2)     Status: None (Preliminary result)   Collection Time: 01/19/20 12:51 PM   Specimen: BLOOD  Result Value Ref Range Status   Specimen Description   Final    BLOOD RIGHT ANTECUBITAL Performed at [facility], [address] [place], [place] [address]    Special Requests   Final    BOTTLES DRAWN AEROBIC AND ANAEROBIC Blood Culture adequate volume Performed at [facility], [address] [place], [place] [address]    Culture   Final    NO GROWTH 4 DAYS Performed at [facility], [address] [place], [place] [address]    Report Status PENDING  Incomplete  MRSA PCR Screening     Status: None   Collection Time: 01/19/20  4:00 PM   Specimen: Nasopharyngeal  Result Value Ref Range Status   MRSA by PCR NEGATIVE NEGATIVE Final    Comment:        The GeneXpert MRSA Assay (FDA approved for NASAL specimens only), is one component of a comprehensive MRSA colonization surveillance program. It is not intended to diagnose MRSA infection nor to guide or monitor treatment for MRSA infections. Performed at [facility], [address] [place], [place] [address]      Labs: BNP (last 3 results) Recent Labs    09/07/19 1950 12/29/19 0451 01/19/20 1251  BNP 37.6 183.9* 728.1*   Basic Metabolic Panel: Recent Labs  Lab 01/19/20 1241 01/20/20 0346 01/21/20 0500 01/22/20 0436  NA 149* 144 139 137  K 3.8 2.7* 4.2 3.8  CL 82* 86* 86* 85*  CO2 50* 47* 44* 40*  GLUCOSE 245* 210* 115* 170*  BUN 13 18 12 11   CREATININE 0.43* 0.43* <0.30* 0.30*  CALCIUM 8.3* 7.8* 8.0* 8.2*  MG 1.5* 1.3* 2.9*  2.0  PHOS 4.6 <1.0* 3.3  --    Liver Function Tests: Recent Labs  Lab 01/19/20 1241 01/21/20 0500 01/22/20 0436  AST 10* 11* 12*  ALT 12 10 15   ALKPHOS 59 45 47  BILITOT 0.6 0.6 0.6  PROT 6.4* 5.5* 5.7*  ALBUMIN 2.8* 2.4*  2.4*   No results for input(s): LIPASE, AMYLASE in the last 168 hours. No results for input(s): AMMONIA in the last 168 hours. CBC: Recent Labs  Lab 01/19/20 1241 01/20/20 0346 01/20/20 1035  WBC 11.1* 7.5 8.2  NEUTROABS 8.8*  --  5.3  HGB 10.7* 8.9* 9.2*  HCT 38.0 31.1* 32.3*  MCV 106.4* 102.3* 101.9*  PLT 184 154 150   Cardiac Enzymes: No results for input(s): CKTOTAL, CKMB, CKMBINDEX, TROPONINI in the last 168 hours. BNP: Invalid input(s): POCBNP CBG: Recent Labs  Lab 01/22/20 1218 01/22/20 1627 01/22/20 2057 01/23/20 0722 01/23/20 1129  GLUCAP 202* 159* 237* 159* 233*   D-Dimer No results for input(s): DDIMER in the last 72 hours. Hgb A1c No results for input(s): HGBA1C in the last 72 hours. Lipid Profile No results for input(s): CHOL, HDL, LDLCALC, TRIG, CHOLHDL, LDLDIRECT in the last 72 hours. Thyroid function studies No results for input(s): TSH, T4TOTAL, T3FREE, THYROIDAB in the last 72 hours.  Invalid input(s): FREET3 Anemia work up No results for input(s): VITAMINB12, FOLATE, FERRITIN, TIBC, IRON, RETICCTPCT in the last 72 hours. Urinalysis    Component Value Date/Time   COLORURINE YELLOW 01/19/2020 1241   APPEARANCEUR CLEAR 01/19/2020 1241   LABSPEC 1.009 01/19/2020 1241   PHURINE 5.0 01/19/2020 1241   GLUCOSEU NEGATIVE 01/19/2020 1241   HGBUR LARGE (A) 01/19/2020 1241   BILIRUBINUR NEGATIVE 01/19/2020 1241   KETONESUR NEGATIVE 01/19/2020 1241   PROTEINUR NEGATIVE 01/19/2020 1241   NITRITE NEGATIVE 01/19/2020 1241   LEUKOCYTESUR MODERATE (A) 01/19/2020 1241   Sepsis Labs Invalid input(s): PROCALCITONIN,  WBC,  LACTICIDVEN Microbiology Recent Results (from the past 240 hour(s))  Urine culture     Status: Abnormal     Collection Time: 01/19/20 12:41 PM   Specimen: In/Out Cath Urine  Result Value Ref Range Status   Specimen Description   Final    IN/OUT CATH URINE Performed at [facility], [address] [place], [place] [address]    Special Requests   Final    NONE Performed at [facility], [address], [place], [place] [address]    Culture >=100,000 COLONIES/mL YEAST (A)  Final   Report Status 01/20/2020 FINAL  Final  SARS Coronavirus 2 by RT PCR (hospital order, performed in [facility] hospital lab) Nasopharyngeal Nasopharyngeal Swab     Status: None   Collection Time: 01/19/20 12:42 PM   Specimen: Nasopharyngeal Swab  Result Value Ref Range Status   SARS Coronavirus 2 NEGATIVE NEGATIVE Final    Comment: (NOTE) SARS-CoV-2 target nucleic acids are NOT DETECTED. The SARS-CoV-2 RNA is generally detectable in upper and lower respiratory specimens during the acute phase of infection. The lowest concentration of SARS-CoV-2 viral copies this assay can detect is 250 copies / mL. A negative result does not preclude SARS-CoV-2 infection and should not be used as the sole basis for treatment or other patient management decisions.  A negative result may occur with improper specimen collection / handling, submission of specimen other than nasopharyngeal swab, presence of viral mutation(s) within the areas targeted by this assay, and inadequate number of viral copies (<250 copies / mL). A negative result must be combined with clinical observations, patient history, and epidemiological information. Fact Sheet for Patients:   BoilerBrush.com.cy Fact Sheet for Healthcare Providers: https://pope.com/ This test is not yet approved or cleared  by the [place] FDA and has been authorized for detection and/or diagnosis of SARS-CoV-2 by FDA under an Emergency Use  Authorization (EUA).  This EUA will remain in effect  (meaning this test can be used) for the duration of the COVID-19 declaration under Section 564(b)(1) of the Act, 21 U.S.C. section 360bbb-3(b)(1), unless the authorization is terminated or revoked sooner. Performed at [facility], [address] [place], [place] [address]   Blood Culture (routine x 2)     Status: None (Preliminary result)   Collection Time: 01/19/20 12:46 PM   Specimen: BLOOD  Result Value Ref Range Status   Specimen Description   Final    BLOOD LEFT ANTECUBITAL Performed at [facility], [address] [place], [place] [address]    Special Requests   Final    BOTTLES DRAWN AEROBIC AND ANAEROBIC Blood Culture results may not be optimal due to an inadequate volume of blood received in culture bottles Performed at [facility], [address], [place], [place] [address]    Culture   Final    NO GROWTH 4 DAYS Performed at [facility], [address] [place], [place] [address]    Report Status PENDING  Incomplete  Blood Culture (routine x 2)     Status: None (Preliminary result)   Collection Time: 01/19/20 12:51 PM   Specimen: BLOOD  Result Value Ref Range Status   Specimen Description   Final    BLOOD RIGHT ANTECUBITAL Performed at [name], [address], [place], [place] [address]    Special Requests   Final    BOTTLES DRAWN AEROBIC AND ANAEROBIC Blood Culture adequate volume Performed at [facility], [address], [place], [place] [address]    Culture   Final    NO GROWTH 4 DAYS Performed at [facility], [address], [place], [place] [address]    Report Status PENDING  Incomplete  MRSA PCR Screening     Status: None   Collection Time: 01/19/20  4:00 PM   Specimen: Nasopharyngeal  Result Value Ref Range Status   MRSA by PCR NEGATIVE NEGATIVE Final    Comment:        The GeneXpert MRSA Assay (FDA approved for NASAL specimens only),  is one component of a comprehensive MRSA colonization surveillance program. It is not intended to diagnose MRSA infection nor to guide or monitor treatment for MRSA infections. Performed at [facility], [address], [place], [place] [address]      Time coordinating discharge: 35 minutes  SIGNED:   Jacquelin Hawking, MD [facility] 01/23/2020, 12:49 PM

## 2020-01-23 NOTE — [place] FL2 (Signed)
[place] MEDICAID FL2 LEVEL OF CARE SCREENING TOOL     IDENTIFICATION  Patient Name: Becky Hendricks Birthdate: May 21, 1946 Sex: female Admission Date (Current Location): 01/19/2020  [facility] and [place] Number:  Producer, television/film/video and Address:  [facility],  [address] [place], [place] [address]      Provider Number: [id number]  Attending Physician Name and Address:  Narda Bonds, MD  Relative Name and Phone Number:  Martena, Emanuele [phone]    Current Level of Care: Hospital Recommended Level of Care: Skilled Nursing Facility Prior Approval Number:    Date Approved/Denied:   PASRR Number: Pending  Discharge Plan: SNF    Current Diagnoses: Patient Active Problem List   Diagnosis Date Noted  . Acute on chronic diastolic CHF (congestive heart failure) (HCC) 01/22/2020  . Acute pulmonary edema (HCC)   . Acute respiratory failure with hypoxemia (HCC) 01/19/2020  . Drug rash   . Acute osteomyelitis of pelvic region and thigh (HCC)   . Palliative care by specialist   . Goals of care, counseling/discussion   . Edema of upper extremity   . Hyperphosphatemia   . Cardiac arrest (HCC)   . Encounter for central line placement   . Acute respiratory failure (HCC)   . Sacral wound   . Hypoxia   . Severe sepsis (HCC) 12/03/2019  . Osteomyelitis (HCC) 12/03/2019  . Abscess of multiple sites of buttock 12/03/2019  . AF (paroxysmal atrial fibrillation) (HCC) 12/03/2019  . Respiratory failure with hypoxia (HCC) 09/15/2019  . Pneumonia due to COVID-19 virus 09/15/2019  . Acute metabolic encephalopathy 09/15/2019  . UTI (urinary tract infection), bacterial 09/15/2019  . Pressure injury of skin 09/10/2019  . AKI (acute kidney injury) (HCC)   . Acute respiratory distress syndrome (ARDS) due to COVID-19 virus (HCC) 09/07/2019    Orientation RESPIRATION BLADDER Height & Weight     Self, Place, Situation  O2 (2L)   Weight: 224 lb 14.4 oz (102 kg) Height:   5\' 6"  (167.6 cm)  BEHAVIORAL SYMPTOMS/MOOD NEUROLOGICAL BOWEL NUTRITION STATUS        Diet (Regular diet)  AMBULATORY STATUS COMMUNICATION OF NEEDS Skin   Limited Assist   PU Stage and Appropriate Care       PU Stage 4 Dressing: Daily               Personal Care Assistance Level of Assistance  Bathing, Feeding, Dressing Bathing Assistance: Limited assistance Feeding assistance: Independent Dressing Assistance: Limited assistance Total Care Assistance: Limited assistance   Functional Limitations Info  Sight, Hearing, Speech Sight Info: Adequate Hearing Info: Adequate Speech Info: Adequate    SPECIAL CARE FACTORS FREQUENCY                       Contractures Contractures Info: Not present    Additional Factors Info  Code Status, Allergies, Psychotropic, Insulin Sliding Scale, Isolation Precautions Code Status Info: Full Code Allergies Info: Chlorhexidine Gluconate Acetazolamide Er Psychotropic Info: divalproex (DEPAKOTE) DR tablet 500 mg Insulin Sliding Scale Info: insulin aspart (novoLOG) injection 0-15 Units 3x a day with meals Isolation Precautions Info: MRSA     Current Medications (01/23/2020):  This is the current hospital active medication list Current Facility-Administered Medications  Medication Dose Route Frequency Provider Last Rate Last Admin  . apixaban (ELIQUIS) tablet 5 mg  5 mg Oral BID 03/24/2020 T, RPH   5 mg at 01/23/20 1031  . atorvastatin (LIPITOR) tablet 10 mg  10 mg  Oral QHS Mariel Aloe, MD   10 mg at 01/22/20 2145  . carvedilol (COREG) tablet 12.5 mg  12.5 mg Oral BID WC Simonne Maffucci B, MD   12.5 mg at 01/23/20 1031  . divalproex (DEPAKOTE) DR tablet 500 mg  500 mg Oral Q12H Leodis Sias T, RPH   500 mg at 01/23/20 1031  . docusate sodium (COLACE) capsule 100 mg  100 mg Oral BID PRN Simonne Maffucci B, MD      . furosemide (LASIX) tablet 40 mg  40 mg Oral Daily Mariel Aloe, MD   40 mg at 01/23/20 1031  . hydrALAZINE  (APRESOLINE) tablet 25 mg  25 mg Oral Q8H Simonne Maffucci B, MD   25 mg at 01/23/20 0705  . insulin aspart (novoLOG) injection 0-15 Units  0-15 Units Subcutaneous TID WC Juanito Doom, MD   3 Units at 01/23/20 1030  . insulin aspart (novoLOG) injection 0-5 Units  0-5 Units Subcutaneous QHS Juanito Doom, MD   2 Units at 01/22/20 2151  . insulin glargine (LANTUS) injection 15 Units  15 Units Subcutaneous QHS Juanito Doom, MD   15 Units at 01/22/20 2143  . losartan (COZAAR) tablet 50 mg  50 mg Oral Daily Mariel Aloe, MD      . MEDLINE mouth rinse  15 mL Mouth Rinse BID Simonne Maffucci B, MD   15 mL at 01/22/20 2146  . multivitamin with minerals tablet 1 tablet  1 tablet Oral Daily Eudelia Bunch, RPH   1 tablet at 01/23/20 1031  . ondansetron (ZOFRAN) injection 4 mg  4 mg Intravenous Q6H PRN Simonne Maffucci B, MD      . polyethylene glycol (MIRALAX / GLYCOLAX) packet 17 g  17 g Oral Daily PRN Simonne Maffucci B, MD      . sodium chloride flush (NS) 0.9 % injection 10-40 mL  10-40 mL Intracatheter Q12H Juanito Doom, MD   10 mL at 01/22/20 2146  . sodium chloride flush (NS) 0.9 % injection 10-40 mL  10-40 mL Intracatheter PRN Juanito Doom, MD         Discharge Medications: Please see discharge summary for a list of discharge medications.  Relevant Imaging Results:  Relevant Lab Results:   Additional Information SSN [id number]  Ross Ludwig, LCSW

## 2020-01-24 LAB — CULTURE, BLOOD (ROUTINE X 2)
Culture: NO GROWTH
Culture: NO GROWTH
Special Requests: ADEQUATE

## 2020-01-24 LAB — GLUCOSE, CAPILLARY
Glucose-Capillary: 139 mg/dL — ABNORMAL HIGH (ref 70–99)
Glucose-Capillary: 223 mg/dL — ABNORMAL HIGH (ref 70–99)
Glucose-Capillary: 126 mg/dL — ABNORMAL HIGH (ref 70–99)
Glucose-Capillary: 204 mg/dL — ABNORMAL HIGH (ref 70–99)

## 2020-01-24 NOTE — Progress Notes (Signed)
PROGRESS NOTE    Becky Hendricks  OEV:[id number] DOB: 03-Jan-1946 DOA: 01/19/2020 PCP: Roderic Scarce, MD   Brief Narrative: Becky Hendricks is a 74 y.o. female with a history of diastolic heart failure, hypertension, atrial fibrillation, osteomyelitis of the coccyx, diabetes mellitus, type 2. Patient presented secondary to mental status change and found to have acute respiratory with hypoxia and hypercapnia. Patient required intubation and ICU admission for treatment using mechanical ventilation.    Assessment & Plan:   Active Problems:   AF (paroxysmal atrial fibrillation) (HCC)   Acute osteomyelitis of pelvic region and thigh (HCC)   Acute respiratory failure with hypoxemia (HCC)   Acute pulmonary edema (HCC)   Acute on chronic diastolic CHF (congestive heart failure) (HCC)   Acute respiratory failure with hypoxia and hypercapnia Acute on chronic respiratory failure with hypoxia Present on admission and secondary to acute heart failure. Associated respiratory acidosis. PCO2 greater than 120 on admission with an associated pH of 7.2. Patient required intubation from 5/9 until 5/11. Currently on 2L via [place]. Discussed with SNF and patient was previously on oxygen.  Acute on chronic diastolic heart failure Last recorded EF of 65-70%. Patient is on Coreg and Lasix as an outpatient. While inpatient, patient diuresed with Lasix IV. Currently net negative about 4.49 L. -Continue home Lasix 40 mg daily and continue Coreg  Acute metabolic encephalopathy Secondary to hypercarbia. Management above  Paroxysmal atrial fibrillation Patient is on Coreg as an outpatient -Continue Eliquis and Coreg  Osteomyelitis of the coccyx Present on admission. Patient was on Vancomycin/Unasyn as an outpatient. Last planned day of antibiotics was 6/10. Patient has completed antibiotic course. PICC discontinued.  Diabetes mellitus, type 2 Patient is on Janumet, Lantus 8 units daily and Humalog sliding scale as  an outpatient -Continue Lantus 15 units qHS and SSI while inpatient  Seizure disorder -Continue Depakote  Hyperlipidemia Patient is on Lipitor as an outpatient. Continue.  Essential hypertension Patient is on losartan as an outpatient. Continue.  Pressure injury Mid sacrum. POA   DVT prophylaxis: Eliquis Code Status:   Code Status: Full Code Family Communication: None at bedside Disposition Plan: Discharge back to SNF when bed available. Medically stable for discharge.   Consultants:   PCCM  Procedures:   None  Antimicrobials:  Vancomycin  Unasyn    Subjective: No issues today. Wanting to know when she will leave.  Objective: Vitals:   01/23/20 1313 01/23/20 2115 01/24/20 0437 01/24/20 0539  BP: (!) 147/77 140/74  (!) 154/86  Pulse: 68 67  74  Resp: 20 18  (!) 22  Temp: 98.4 F (36.9 C) 99.2 F (37.3 C)  98.4 F (36.9 C)  TempSrc: Oral Oral  Oral  SpO2: 98% 97%  95%  Weight:   93.8 kg   Height:        Intake/Output Summary (Last 24 hours) at 01/24/2020 0858 Last data filed at 01/24/2020 0500 Gross per 24 hour  Intake 240 ml  Output 2926 ml  Net -2686 ml   Filed Weights   01/21/20 0500 01/22/20 0554 01/24/20 0437  Weight: 95.7 kg 102 kg 93.8 kg    Examination:  General exam: Appears calm and comfortable Respiratory system: Clear to auscultation. Respiratory effort normal. Cardiovascular system: S1 & S2 heard, RRR. No murmurs, rubs, gallops or clicks. Gastrointestinal system: Abdomen is nondistended, soft and nontender. No organomegaly or masses felt. Normal bowel sounds heard. Central nervous system: Alert and oriented to person and place. No focal neurological deficits. Musculoskeletal: No edema.  No calf tenderness Skin: No cyanosis.     Data Reviewed: I have personally reviewed following labs and imaging studies  CBC Lab Results  Component Value Date   WBC 8.2 01/20/2020   RBC 3.17 (L) 01/20/2020   HGB 9.2 (L) 01/20/2020   HCT  32.3 (L) 01/20/2020   MCV 101.9 (H) 01/20/2020   MCH 29.0 01/20/2020   PLT 150 01/20/2020   MCHC 28.5 (L) 01/20/2020   RDW 17.2 (H) 01/20/2020   LYMPHSABS 1.8 01/20/2020   MONOABS 0.7 01/20/2020   EOSABS 0.3 01/20/2020   BASOSABS 0.0 57/84/6962     Last metabolic panel Lab Results  Component Value Date   NA 137 01/22/2020   K 3.8 01/22/2020   CL 85 (L) 01/22/2020   CO2 40 (H) 01/22/2020   BUN 11 01/22/2020   CREATININE 0.30 (L) 01/22/2020   GLUCOSE 170 (H) 01/22/2020   GFRNONAA >60 01/22/2020   GFRAA >60 01/22/2020   CALCIUM 8.2 (L) 01/22/2020   PHOS 3.3 01/21/2020   PROT 5.7 (L) 01/22/2020   ALBUMIN 2.4 (L) 01/22/2020   BILITOT 0.6 01/22/2020   ALKPHOS 47 01/22/2020   AST 12 (L) 01/22/2020   ALT 15 01/22/2020   ANIONGAP 12 01/22/2020    CBG (last 3)  Recent Labs    01/23/20 1647 01/23/20 2112 01/24/20 0724  GLUCAP 164* 188* 139*     GFR: Estimated Creatinine Clearance: 73.4 mL/min (A) (by C-G formula based on SCr of 0.3 mg/dL (L)).  Coagulation Profile: Recent Labs  Lab 01/19/20 1241  INR 1.3*    Recent Results (from the past 240 hour(s))  Urine culture     Status: Abnormal   Collection Time: 01/19/20 12:41 PM   Specimen: In/Out Cath Urine  Result Value Ref Range Status   Specimen Description   Final    IN/OUT CATH URINE Performed at [facility] [address] [place], [place] [address]    Special Requests   Final    NONE Performed at [facility], [place] [address], [place], [place] [address]    Culture >=100,000 COLONIES/mL YEAST (A)  Final   Report Status 01/20/2020 FINAL  Final  SARS Coronavirus 2 by RT PCR (hospital order, performed in [facility] hospital lab) Nasopharyngeal Nasopharyngeal Swab     Status: None   Collection Time: 01/19/20 12:42 PM   Specimen: Nasopharyngeal Swab  Result Value Ref Range Status   SARS Coronavirus 2 NEGATIVE NEGATIVE Final    Comment: (NOTE) SARS-CoV-2 target nucleic  acids are NOT DETECTED. The SARS-CoV-2 RNA is generally detectable in upper and lower respiratory specimens during the acute phase of infection. The lowest concentration of SARS-CoV-2 viral copies this assay can detect is 250 copies / mL. A negative result does not preclude SARS-CoV-2 infection and should not be used as the sole basis for treatment or other patient management decisions.  A negative result may occur with improper specimen collection / handling, submission of specimen other than nasopharyngeal swab, presence of viral mutation(s) within the areas targeted by this assay, and inadequate number of viral copies (<250 copies / mL). A negative result must be combined with clinical observations, patient history, and epidemiological information. Fact Sheet for Patients:   StrictlyIdeas.no Fact Sheet for Healthcare Providers: BankingDealers.co.za This test is not yet approved or cleared  by the [place] FDA and has been authorized for detection and/or diagnosis of SARS-CoV-2 by FDA under an Emergency Use Authorization (EUA).  This EUA will remain  in effect (meaning this test can be used) for the duration of the COVID-19 declaration under Section 564(b)(1) of the Act, 21 U.S.C. section 360bbb-3(b)(1), unless the authorization is terminated or revoked sooner. Performed at [facility], [address] [place], [place] [address]   Blood Culture (routine x 2)     Status: None   Collection Time: 01/19/20 12:46 PM   Specimen: BLOOD  Result Value Ref Range Status   Specimen Description   Final    BLOOD LEFT ANTECUBITAL Performed at [facility], [address] [place], [place] [address]    Special Requests   Final    BOTTLES DRAWN AEROBIC AND ANAEROBIC Blood Culture results may not be optimal due to an inadequate volume of blood received in culture bottles Performed at [facility], [address], [place], [place] [address]    Culture   Final    NO GROWTH 5 DAYS Performed at [facility], [address] [place], [place] [address]    Report Status 01/24/2020 FINAL  Final  Blood Culture (routine x 2)     Status: None   Collection Time: 01/19/20 12:51 PM   Specimen: BLOOD  Result Value Ref Range Status   Specimen Description   Final    BLOOD RIGHT ANTECUBITAL Performed at [facility], [address] [place], [place] [address]    Special Requests   Final    BOTTLES DRAWN AEROBIC AND ANAEROBIC Blood Culture adequate volume Performed at [facility], [address] [place], [place] [address]    Culture   Final    NO GROWTH 5 DAYS Performed at [facility], [address] [place], [place] [address]    Report Status 01/24/2020 FINAL  Final  MRSA PCR Screening     Status: None   Collection Time: 01/19/20  4:00 PM   Specimen: Nasopharyngeal  Result Value Ref Range Status   MRSA by PCR NEGATIVE NEGATIVE Final    Comment:        The GeneXpert MRSA Assay (FDA approved for NASAL specimens only), is one component of a comprehensive MRSA colonization surveillance program. It is not intended to diagnose MRSA infection nor to guide or monitor treatment for MRSA infections. Performed at [facility], [address], [place], [place] [address]   SARS Coronavirus 2 by RT PCR (hospital order, performed in [facility] hospital lab) Nasopharyngeal Nasopharyngeal Swab     Status: None   Collection Time: 01/23/20  2:18 PM   Specimen: Nasopharyngeal Swab  Result Value Ref Range Status   SARS Coronavirus 2 NEGATIVE NEGATIVE Final    Comment: (NOTE) SARS-CoV-2 target nucleic acids are NOT DETECTED.  The SARS-CoV-2 RNA is generally detectable in upper and lower respiratory specimens during the acute phase of infection. The lowest concentration of SARS-CoV-2 viral copies this assay can  detect is 250 copies / mL. A negative result does not preclude SARS-CoV-2 infection and should not be used as the sole basis for treatment or other patient management decisions.  A negative result may occur with improper specimen collection / handling, submission of specimen other than nasopharyngeal swab, presence of viral mutation(s) within the areas targeted by this assay, and inadequate number of viral copies (<250 copies / mL). A negative result must be combined with clinical observations, patient history, and epidemiological information.  Fact Sheet for Patients:   BoilerBrush.com.cy  Fact Sheet for Healthcare Providers: https://pope.com/  This test is not yet approved or  cleared by the [place] and has been authorized for detection and/or diagnosis of SARS-CoV-2 by FDA under an Emergency Use Authorization (EUA).  This EUA will remain in effect (meaning this test can be used) for the duration of the COVID-19 declaration under Section 564(b)(1) of the Act, 21 U.S.C. section 360bbb-3(b)(1), unless the authorization is terminated or revoked sooner.  Performed at [facility], [address], [place], [place] [address]         Radiology Studies: No results found.      Scheduled Meds: . apixaban  5 mg Oral BID  . atorvastatin  10 mg Oral QHS  . carvedilol  12.5 mg Oral BID WC  . divalproex  500 mg Oral Q12H  . furosemide  40 mg Oral Daily  . hydrALAZINE  25 mg Oral Q8H  . insulin aspart  0-15 Units Subcutaneous TID WC  . insulin aspart  0-5 Units Subcutaneous QHS  . insulin glargine  15 Units Subcutaneous QHS  . losartan  50 mg Oral Daily  . mouth rinse  15 mL Mouth Rinse BID  . multivitamin with minerals  1 tablet Oral Daily  . sodium chloride flush  10-40 mL Intracatheter Q12H   Continuous Infusions:   LOS: 5 days     Jacquelin Hawking, MD [facility] 01/24/2020, 8:58 AM  If  7PM-7AM, please contact night-coverage [email or web address]

## 2020-01-25 LAB — GLUCOSE, CAPILLARY
Glucose-Capillary: 140 mg/dL — ABNORMAL HIGH (ref 70–99)
Glucose-Capillary: 223 mg/dL — ABNORMAL HIGH (ref 70–99)
Glucose-Capillary: 216 mg/dL — ABNORMAL HIGH (ref 70–99)
Glucose-Capillary: 227 mg/dL — ABNORMAL HIGH (ref 70–99)

## 2020-01-25 NOTE — Progress Notes (Signed)
Physical Therapy Treatment Patient Details Name: Becky Hendricks MRN: [id number] DOB: 15-Jun-1946 Today's Date: 01/25/2020    History of Present Illness 74 Y/o with PMH of afib, bipolar, HTN, Obesity, DM, Pressure ulcer with recent admission for left buttock abscess s.p drainage , recent  admission for a brief cardiac arrest of uncertain etiology presented 01/19/20  from her nursing home unresponsive and in hypercarbic respiratory failure, extubated 01/20/20.    PT Comments    The  Patient was assisted to roll  To each side multiple times to be cleaned from incontinent BM. Patient requires max assistance for mobilty. Patient placed in bed/chair position for lunch arriving soon. Patient Currently will require mechanical lift for OOB. Patient has a sacral wound/osteo. Continue PT, Plans to return to SNF.  Follow Up Recommendations  SNF     Equipment Recommendations  None recommended by PT    Recommendations for Other Services       Precautions / Restrictions Precautions Precautions: Fall Precaution Comments: sacral wound, stage IV with osteo, check for BM prior to sitting Restrictions Weight Bearing Restrictions: No    Mobility  Bed Mobility   Bed Mobility: Rolling Rolling: Mod assist;Max assist Sidelying to sit: +2 for physical assistance       General bed mobility comments: multimodal cues  to roll x 2 to each side, definitely mosre assistance required to roll to the left. left in bed chair position for lunch. Patient had incontinent BM. upon arrival  Transfers                    Ambulation/Gait                 Stairs             Wheelchair Mobility    Modified Rankin (Stroke Patients Only)       Balance                                            Cognition Arousal/Alertness: Awake/alert Behavior During Therapy: WFL for tasks assessed/performed Overall Cognitive Status: History of cognitive impairments - at baseline                        Memory: Decreased short-term memory Following Commands: Follows one step commands inconsistently Safety/Judgement: Decreased awareness of deficits   Problem Solving: Slow processing;Requires verbal cues;Requires tactile cues General Comments: oriented to hospital      Exercises      General Comments        Pertinent Vitals/Pain Faces Pain Scale: Hurts little more Pain Location: buttocks Pain Descriptors / Indicators: Discomfort Pain Intervention(s): Monitored during session;Repositioned    Home Living                      Prior Function            PT Goals (current goals can now be found in the care plan section) Progress towards PT goals: Progressing toward goals    Frequency    Min 1X/week      PT Plan Current plan remains appropriate    Co-evaluation              AM-PAC PT "6 Clicks" Mobility   Outcome Measure  Help needed turning from your back to your side while in a flat bed without  using bedrails?: Total Help needed moving from lying on your back to sitting on the side of a flat bed without using bedrails?: Total Help needed moving to and from a bed to a chair (including a wheelchair)?: Total Help needed standing up from a chair using your arms (e.g., wheelchair or bedside chair)?: Total Help needed to walk in hospital room?: Total Help needed climbing 3-5 steps with a railing? : Total 6 Click Score: 6    End of Session   Activity Tolerance: Patient limited by fatigue Patient left: in bed;with call bell/phone within reach;with bed alarm set Nurse Communication: Mobility status;Need for lift equipment PT Visit Diagnosis: Other abnormalities of gait and mobility (R26.89);Muscle weakness (generalized) (M62.81)     Time: 7681-1572 PT Time Calculation (min) (ACUTE ONLY): 27 min  Charges:  $Therapeutic Activity: 23-37 mins                     Becky Hendricks PT Acute Rehabilitation Services Pager  [phone] Office [phone]    Becky Hendricks 01/25/2020, 12:47 PM

## 2020-01-25 NOTE — Care Management Important Message (Signed)
Important Message  Patient Details IM Letter given to Ezekiel Ina RN Case Manager to present to the Patient Name: Becky Hendricks MRN: [id number] Date of Birth: Jul 27, 1946   Medicare Important Message Given:  Yes     Caren Macadam 01/25/2020, 11:54 AM

## 2020-01-25 NOTE — Clinical Social Work Note (Signed)
30 Day [facility] Note  RE: Becky Hendricks       Date of Birth: 1946/07/21    Date: 01/25/20       To Whom It May Concern:  Please be advised that the above-named patient will require a short-term nursing home stay - anticipated 30 days or less for rehabilitation and strengthening.  The plan is for return home.   Windell Moulding, MSW, [place] [phone]

## 2020-01-25 NOTE — TOC Progression Note (Signed)
Transition of Care [facility]) - Progression Note    Patient Details  Name: Becky Hendricks MRN: [id number] Date of Birth: 18-Oct-1945  [facility] CM/SW Contact  Geni Bers, RN Phone Number: 01/25/2020, 10:24 AM  Clinical Narrative:    [name] is not back as of this present time.         Expected Discharge Plan and Services           Expected Discharge Date: 01/23/20                                     Social Determinants of Health (SDOH) Interventions    Readmission Risk Interventions No flowsheet data found.

## 2020-01-25 NOTE — TOC Progression Note (Addendum)
Transition of Care [facility]) - Progression Note    Patient Details  Name: Becky Hendricks MRN: [id number] Date of Birth: 10/01/1945  [facility]) CM/SW Contact  Darleene Cleaver, [place] Phone Number: 01/25/2020, 3:01 PM  Clinical Narrative:     CSW received notification that [facility] needed additional information.  CSW uploaded requested clinicals to [facility].  CSW also contacted patient's insurance company to get an update on insurance authorization, per [facility], she is still being reviewed.  CSW faxed updated clinicals to insurance company as well.    3:00pm  CSW received notification from [facility] that patient is now a manual review for [facility], CSW to continue to follow patient's progress throughout discharge planning.    Expected Discharge Plan and Services  Back to [facility].         Expected Discharge Date: 01/23/20                                     Social Determinants of Health (SDOH) Interventions    Readmission Risk Interventions No flowsheet data found.

## 2020-01-25 NOTE — Progress Notes (Signed)
PROGRESS NOTE    Becky Hendricks  ONG:[id number] DOB: [birth year] DOA: 01/19/2020 PCP: Roderic Scarce, MD   Brief Narrative: Becky Hendricks is a 74 y.o. female with a history of diastolic heart failure, hypertension, atrial fibrillation, osteomyelitis of the coccyx, diabetes mellitus, type 2. Patient presented secondary to mental status change and found to have acute respiratory with hypoxia and hypercapnia. Patient required intubation and ICU admission for treatment using mechanical ventilation.    Assessment & Plan:   Active Problems:   AF (paroxysmal atrial fibrillation) (HCC)   Acute osteomyelitis of pelvic region and thigh (HCC)   Acute respiratory failure with hypoxemia (HCC)   Acute pulmonary edema (HCC)   Acute on chronic diastolic CHF (congestive heart failure) (HCC)   Acute respiratory failure with hypoxia and hypercapnia Acute on chronic respiratory failure with hypoxia Present on admission and secondary to acute heart failure. Associated respiratory acidosis. PCO2 greater than 120 on admission with an associated pH of 7.2. Patient required intubation from 5/9 until 5/11. Currently on 2L via [place]. Discussed with SNF and patient was previously on oxygen.  Acute on chronic diastolic heart failure Last recorded EF of 65-70%. Patient is on Coreg and Lasix as an outpatient. While inpatient, patient diuresed with Lasix IV. Currently net negative about 4.49 L. -Continue home Lasix 40 mg daily and continue Coreg  Acute metabolic encephalopathy Secondary to hypercarbia. Management above  Paroxysmal atrial fibrillation Patient is on Coreg as an outpatient -Continue Eliquis and Coreg  Osteomyelitis of the coccyx Present on admission. Patient was on Vancomycin/Unasyn as an outpatient. Last planned day of antibiotics was 6/10. Patient has completed antibiotic course. PICC discontinued.  Diabetes mellitus, type 2 Patient is on Janumet, Lantus 8 units daily and Humalog sliding scale as  an outpatient -Continue Lantus 15 units qHS and SSI while inpatient  Seizure disorder -Continue Depakote  Hyperlipidemia Patient is on Lipitor as an outpatient. Continue.  Essential hypertension Patient is on losartan as an outpatient. Continue.  Pressure injury Mid sacrum. POA   DVT prophylaxis: Eliquis Code Status:   Code Status: Full Code Family Communication: None at bedside Disposition Plan: Discharge back to SNF when bed available. Medically stable for discharge.   Consultants:   PCCM  Procedures:   None  Antimicrobials:  Vancomycin  Unasyn    Subjective: No issues although she is trying to explain something to me but is unable to find the right words.  Objective: Vitals:   01/24/20 2041 01/24/20 2043 01/25/20 0500 01/25/20 0529  BP: (!) 150/67 (!) 150/67  (!) 144/68  Pulse: 69 72  65  Resp: 16   16  Temp: 98.4 F (36.9 C)   99.3 F (37.4 C)  TempSrc: Oral   Oral  SpO2: 97% 97%  94%  Weight:   91.7 kg   Height:        Intake/Output Summary (Last 24 hours) at 01/25/2020 0820 Last data filed at 01/25/2020 0607 Gross per 24 hour  Intake 480 ml  Output 1300 ml  Net -820 ml   Filed Weights   01/22/20 0554 01/24/20 0437 01/25/20 0500  Weight: 102 kg 93.8 kg 91.7 kg    Examination:  General exam: Appears calm and comfortable Respiratory system: Clear to auscultation. Respiratory effort normal. Cardiovascular system: S1 & S2 heard, RRR. No murmurs, rubs, gallops or clicks. Gastrointestinal system: Abdomen is nondistended, soft and nontender. No organomegaly or masses felt. Normal bowel sounds heard. Central nervous system: Alert. No focal neurological deficits. Musculoskeletal: No edema.  No calf tenderness Skin: No cyanosis. No rashes Psychiatry: Appears anxious/worried    Data Reviewed: I have personally reviewed following labs and imaging studies  CBC Lab Results  Component Value Date   WBC 8.2 01/20/2020   RBC 3.17 (L) 01/20/2020    HGB 9.2 (L) 01/20/2020   HCT 32.3 (L) 01/20/2020   MCV 101.9 (H) 01/20/2020   MCH 29.0 01/20/2020   PLT 150 01/20/2020   MCHC 28.5 (L) 01/20/2020   RDW 17.2 (H) 01/20/2020   LYMPHSABS 1.8 01/20/2020   MONOABS 0.7 01/20/2020   EOSABS 0.3 01/20/2020   BASOSABS 0.0 01/20/2020     Last metabolic panel Lab Results  Component Value Date   NA 137 01/22/2020   K 3.8 01/22/2020   CL 85 (L) 01/22/2020   CO2 40 (H) 01/22/2020   BUN 11 01/22/2020   CREATININE 0.30 (L) 01/22/2020   GLUCOSE 170 (H) 01/22/2020   GFRNONAA >60 01/22/2020   GFRAA >60 01/22/2020   CALCIUM 8.2 (L) 01/22/2020   PHOS 3.3 01/21/2020   PROT 5.7 (L) 01/22/2020   ALBUMIN 2.4 (L) 01/22/2020   BILITOT 0.6 01/22/2020   ALKPHOS 47 01/22/2020   AST 12 (L) 01/22/2020   ALT 15 01/22/2020   ANIONGAP 12 01/22/2020    CBG (last 3)  Recent Labs    01/24/20 1610 01/24/20 2324 01/25/20 0733  GLUCAP 126* 204* 140*     GFR: Estimated Creatinine Clearance: 72.6 mL/min (A) (by C-G formula based on SCr of 0.3 mg/dL (L)).  Coagulation Profile: Recent Labs  Lab 01/19/20 1241  INR 1.3*    Recent Results (from the past 240 hour(s))  Urine culture     Status: Abnormal   Collection Time: 01/19/20 12:41 PM   Specimen: In/Out Cath Urine  Result Value Ref Range Status   Specimen Description   Final    IN/OUT CATH URINE Performed at [facility], [address] [place], [place] [address]    Special Requests   Final    NONE Performed at [facility], [address], [place], [place] [address]    Culture >=100,000 COLONIES/mL YEAST (A)  Final   Report Status 01/20/2020 FINAL  Final  SARS Coronavirus 2 by RT PCR (hospital order, performed in [facility] hospital lab) Nasopharyngeal Nasopharyngeal Swab     Status: None   Collection Time: 01/19/20 12:42 PM   Specimen: Nasopharyngeal Swab  Result Value Ref Range Status   SARS Coronavirus 2 NEGATIVE NEGATIVE Final    Comment:  (NOTE) SARS-CoV-2 target nucleic acids are NOT DETECTED. The SARS-CoV-2 RNA is generally detectable in upper and lower respiratory specimens during the acute phase of infection. The lowest concentration of SARS-CoV-2 viral copies this assay can detect is 250 copies / mL. A negative result does not preclude SARS-CoV-2 infection and should not be used as the sole basis for treatment or other patient management decisions.  A negative result may occur with improper specimen collection / handling, submission of specimen other than nasopharyngeal swab, presence of viral mutation(s) within the areas targeted by this assay, and inadequate number of viral copies (<250 copies / mL). A negative result must be combined with clinical observations, patient history, and epidemiological information. Fact Sheet for Patients:   BoilerBrush.com.cy Fact Sheet for Healthcare Providers: https://pope.com/ This test is not yet approved or cleared  by the [place] FDA and has been authorized for detection and/or diagnosis of SARS-CoV-2 by FDA under an Emergency Use Authorization (EUA).  This EUA will remain in effect (meaning this test can be used) for the duration of the COVID-19 declaration under Section 564(b)(1) of the Act, 21 U.S.C. section 360bbb-3(b)(1), unless the authorization is terminated or revoked sooner. Performed at [facility], [place] [address] [place], [place] [address]   Blood Culture (routine x 2)     Status: None   Collection Time: 01/19/20 12:46 PM   Specimen: BLOOD  Result Value Ref Range Status   Specimen Description   Final    BLOOD LEFT ANTECUBITAL Performed at [place] [address], [place], [place] [address]    Special Requests   Final    BOTTLES DRAWN AEROBIC AND ANAEROBIC Blood Culture results may not be optimal due to an inadequate volume of blood received in culture  bottles Performed at [place] [address] [place], [place] [address]    Culture   Final    NO GROWTH 5 DAYS Performed at [facility], [place] [address], [place], [place] [address]    Report Status 01/24/2020 FINAL  Final  Blood Culture (routine x 2)     Status: None   Collection Time: 01/19/20 12:51 PM   Specimen: BLOOD  Result Value Ref Range Status   Specimen Description   Final    BLOOD RIGHT ANTECUBITAL Performed at [place] [address], [place], [place] [address]    Special Requests   Final    BOTTLES DRAWN AEROBIC AND ANAEROBIC Blood Culture adequate volume Performed at [facility] [address], [place], [place] [address]    Culture   Final    NO GROWTH 5 DAYS Performed at [facility], [place] [address], [place],  [address]    Report Status 01/24/2020 FINAL  Final  MRSA PCR Screening     Status: None   Collection Time: 01/19/20  4:00 PM   Specimen: Nasopharyngeal  Result Value Ref Range Status   MRSA by PCR NEGATIVE NEGATIVE Final    Comment:        The GeneXpert MRSA Assay (FDA approved for NASAL specimens only), is one component of a comprehensive MRSA colonization surveillance program. It is not intended to diagnose MRSA infection nor to guide or monitor treatment for MRSA infections. Performed at [facility], [place] [address], [place],  [address]   SARS Coronavirus 2 by RT PCR (hospital order, performed in [facility] hospital lab) Nasopharyngeal Nasopharyngeal Swab     Status: None   Collection Time: 01/23/20  2:18 PM   Specimen: Nasopharyngeal Swab  Result Value Ref Range Status   SARS Coronavirus 2 NEGATIVE NEGATIVE Final    Comment: (NOTE) SARS-CoV-2 target nucleic acids are NOT DETECTED.  The SARS-CoV-2 RNA is generally detectable in upper and lower respiratory specimens during the acute phase of infection. The lowest concentration of  SARS-CoV-2 viral copies this assay can detect is 250 copies / mL. A negative result does not preclude SARS-CoV-2 infection and should not be used as the sole basis for treatment or other patient management decisions.  A negative result may occur with improper specimen collection / handling, submission of specimen other than nasopharyngeal swab, presence of viral mutation(s) within the areas targeted by this assay, and inadequate number of viral copies (<250 copies / mL). A negative result must be combined with clinical observations, patient history, and epidemiological information.  Fact Sheet for Patients:   StrictlyIdeas.no  Fact Sheet  for Healthcare Providers: https://pope.com/  This test is not yet approved or  cleared by the [place] and has been authorized for detection and/or diagnosis of SARS-CoV-2 by FDA under an Emergency Use Authorization (EUA).  This EUA will remain in effect (meaning this test can be used) for the duration of the COVID-19 declaration under Section 564(b)(1) of the Act, 21 U.S.C. section 360bbb-3(b)(1), unless the authorization is terminated or revoked sooner.  Performed at [facility], [address], [place], [place] [address]         Radiology Studies: No results found.      Scheduled Meds: . apixaban  5 mg Oral BID  . atorvastatin  10 mg Oral QHS  . carvedilol  12.5 mg Oral BID WC  . divalproex  500 mg Oral Q12H  . furosemide  40 mg Oral Daily  . hydrALAZINE  25 mg Oral Q8H  . insulin aspart  0-15 Units Subcutaneous TID WC  . insulin aspart  0-5 Units Subcutaneous QHS  . insulin glargine  15 Units Subcutaneous QHS  . losartan  50 mg Oral Daily  . mouth rinse  15 mL Mouth Rinse BID  . multivitamin with minerals  1 tablet Oral Daily  . sodium chloride flush  10-40 mL Intracatheter Q12H   Continuous Infusions:   LOS: 6 days     Jacquelin Hawking, MD [facility] 01/25/2020, 8:20 AM  If 7PM-7AM, please contact night-coverage [email or web address]

## 2020-01-26 DIAGNOSIS — R509 Fever, unspecified: Secondary | ICD-10-CM

## 2020-01-26 LAB — GLUCOSE, CAPILLARY
Glucose-Capillary: 156 mg/dL — ABNORMAL HIGH (ref 70–99)
Glucose-Capillary: 206 mg/dL — ABNORMAL HIGH (ref 70–99)
Glucose-Capillary: 118 mg/dL — ABNORMAL HIGH (ref 70–99)
Glucose-Capillary: 289 mg/dL — ABNORMAL HIGH (ref 70–99)

## 2020-01-26 LAB — CBC
HCT: 36 % (ref 36.0–46.0)
Hemoglobin: 10.9 g/dL — ABNORMAL LOW (ref 12.0–15.0)
MCH: 29.1 pg (ref 26.0–34.0)
MCHC: 30.3 g/dL (ref 30.0–36.0)
MCV: 96.3 fL (ref 80.0–100.0)
Platelets: 200 10*3/uL (ref 150–400)
RBC: 3.74 MIL/uL — ABNORMAL LOW (ref 3.87–5.11)
RDW: 18.1 % — ABNORMAL HIGH (ref 11.5–15.5)
WBC: 8.4 10*3/uL (ref 4.0–10.5)
nRBC: 0 % (ref 0.0–0.2)

## 2020-01-26 LAB — COMPREHENSIVE METABOLIC PANEL
ALT: 19 U/L (ref 0–44)
AST: 17 U/L (ref 15–41)
Albumin: 2.8 g/dL — ABNORMAL LOW (ref 3.5–5.0)
Alkaline Phosphatase: 49 U/L (ref 38–126)
Anion gap: 11 (ref 5–15)
BUN: 13 mg/dL (ref 8–23)
CO2: 32 mmol/L (ref 22–32)
Calcium: 8.7 mg/dL — ABNORMAL LOW (ref 8.9–10.3)
Chloride: 97 mmol/L — ABNORMAL LOW (ref 98–111)
Creatinine, Ser: 0.36 mg/dL — ABNORMAL LOW (ref 0.44–1.00)
GFR calc Af Amer: >60 mL/min (ref >60)
GFR calc non Af Amer: >60 mL/min (ref >60)
Glucose, Bld: 192 mg/dL — ABNORMAL HIGH (ref 70–99)
Potassium: 4 mmol/L (ref 3.5–5.1)
Sodium: 140 mmol/L (ref 135–145)
Total Bilirubin: 0.5 mg/dL (ref 0.3–1.2)
Total Protein: 6.1 g/dL — ABNORMAL LOW (ref 6.5–8.1)

## 2020-01-26 MED ORDER — JUVEN PO PACK
1.0000 | PACK | Freq: Two times a day (BID) | ORAL | Status: DC
  Administered 2020-01-27: 1 via ORAL
  Filled 2020-01-26 (×3): qty 1

## 2020-01-26 MED ORDER — ENSURE MAX PROTEIN PO LIQD
11.0000 [oz_av] | Freq: Two times a day (BID) | ORAL | Status: DC
  Administered 2020-01-26 – 2020-01-27 (×3): 11 [oz_av] via ORAL
  Filled 2020-01-26 (×3): qty 330

## 2020-01-26 NOTE — Progress Notes (Signed)
Foley catheter was leaking. Catheter had been irrigated previously per M.D verbal order. Foley catheter continued to leak. Foley cathter removed. Pure wick placed. Will  Continue to monitor

## 2020-01-26 NOTE — TOC Progression Note (Signed)
Transition of Care [facility]) - Progression Note    Patient Details  Name: Becky Hendricks MRN: [id number] Date of Birth: 02-Sep-1945  [facility]) CM/SW Contact  Darleene Cleaver, [place] Phone Number:  01/26/2020, 5:42 PM  Clinical Narrative:     CSW received insurance authorization for patient to go to SNF, insurance reference number is [id number].  Patient has been approved at a Level 2, next review June 17th.  Patient's 30 day [facility] number has been received, expiration February 25, 2020, [facility] number is [id number].  CSW updated [employer], they said patient can return once she is medically ready for discharge, SNF requesting new Covid test, CSW asked attending physician to complete.  CSW to continue to follow patient's progress throughout discharge planning.       Expected Discharge Plan and Services           Expected Discharge Date: 01/23/20                                     Social Determinants of Health (SDOH) Interventions    Readmission Risk Interventions No flowsheet data found.

## 2020-01-26 NOTE — Progress Notes (Signed)
PROGRESS NOTE    Becky Hendricks  OZH:[id number] DOB: Dec 22, 1945 DOA: 01/19/2020 PCP: Roderic Scarce, MD   Brief Narrative: Becky Hendricks is a 74 y.o. female with a history of diastolic heart failure, hypertension, atrial fibrillation, osteomyelitis of the coccyx, diabetes mellitus, type 2. Patient presented secondary to mental status change and found to have acute respiratory with hypoxia and hypercapnia. Patient required intubation and ICU admission for treatment using mechanical ventilation.    Assessment & Plan:   Active Problems:   AF (paroxysmal atrial fibrillation) (HCC)   Acute osteomyelitis of pelvic region and thigh (HCC)   Acute respiratory failure with hypoxemia (HCC)   Acute pulmonary edema (HCC)   Acute on chronic diastolic CHF (congestive heart failure) (HCC)   Acute respiratory failure with hypoxia and hypercapnia Acute on chronic respiratory failure with hypoxia Present on admission and secondary to acute heart failure. Associated respiratory acidosis. PCO2 greater than 120 on admission with an associated pH of 7.2. Patient required intubation from 5/9 until 5/11. Currently on 2L via [place]. Discussed with SNF and patient was previously on oxygen.  Acute on chronic diastolic heart failure Last recorded EF of 65-70%. Patient is on Coreg and Lasix as an outpatient. While inpatient, patient diuresed with Lasix IV. Currently net negative about 4.49 L. -Continue home Lasix 40 mg daily and continue Coreg  Fever Tmax of 100.4 F overnight. Likely spurious but with history of osteomyelitis, will continue to observe for another 24 hours. -Obtain CBC and CMP -Watch for 24 hours  Acute metabolic encephalopathy Secondary to hypercarbia. Management above  Paroxysmal atrial fibrillation Patient is on Coreg as an outpatient -Continue Eliquis and Coreg  Osteomyelitis of the coccyx Present on admission. Patient was on Vancomycin/Unasyn as an outpatient. Last planned day of  antibiotics was 6/10. Patient has completed antibiotic course. PICC discontinued.  Diabetes mellitus, type 2 Patient is on Janumet, Lantus 8 units daily and Humalog sliding scale as an outpatient -Continue Lantus 15 units qHS and SSI while inpatient  Seizure disorder -Continue Depakote  Hyperlipidemia Patient is on Lipitor as an outpatient. Continue.  Essential hypertension Patient is on losartan as an outpatient. Continue.  Chronic foley Some leaking. Documented as a 20 Fr. Flushes well. Will need to keep area clean.  Pressure injury Mid sacrum. POA   DVT prophylaxis: Eliquis Code Status:   Code Status: Full Code Family Communication: None at bedside Disposition Plan: Discharge back to SNF; will discharge in 24 hours if afebrile. Patient will need repeat COVID-19 testing prior to discharge.   Consultants:   PCCM  Procedures:   None  Antimicrobials:  Vancomycin  Unasyn    Subjective: No issues overnight.  Objective: Vitals:   01/25/20 1242 01/25/20 1800 01/25/20 2044 01/26/20 0453  BP: (!) 153/90 (!) 146/80 (!) 150/87 131/79  Pulse: (!) 54 (!) 58 73 67  Resp: 18  20 16   Temp: 98.3 F (36.8 C)  (!) 100.4 F (38 C) 98.4 F (36.9 C)  TempSrc: Oral  Oral Oral  SpO2: 96%  92% 93%  Weight:      Height:        Intake/Output Summary (Last 24 hours) at 01/26/2020 0830 Last data filed at 01/26/2020 0631 Gross per 24 hour  Intake 220 ml  Output 650 ml  Net -430 ml   Filed Weights   01/22/20 0554 01/24/20 0437 01/25/20 0500  Weight: 102 kg 93.8 kg 91.7 kg    Examination:  General exam: Appears calm and comfortable  Data Reviewed: I have personally reviewed following labs and imaging studies  CBC Lab Results  Component Value Date   WBC 8.2 01/20/2020   RBC 3.17 (L) 01/20/2020   HGB 9.2 (L) 01/20/2020   HCT 32.3 (L) 01/20/2020   MCV 101.9 (H) 01/20/2020   MCH 29.0 01/20/2020   PLT 150 01/20/2020   MCHC 28.5 (L) 01/20/2020   RDW 17.2 (H)  01/20/2020   LYMPHSABS 1.8 01/20/2020   MONOABS 0.7 01/20/2020   EOSABS 0.3 01/20/2020   BASOSABS 0.0 01/20/2020     Last metabolic panel Lab Results  Component Value Date   NA 137 01/22/2020   K 3.8 01/22/2020   CL 85 (L) 01/22/2020   CO2 40 (H) 01/22/2020   BUN 11 01/22/2020   CREATININE 0.30 (L) 01/22/2020   GLUCOSE 170 (H) 01/22/2020   GFRNONAA >60 01/22/2020   GFRAA >60 01/22/2020   CALCIUM 8.2 (L) 01/22/2020   PHOS 3.3 01/21/2020   PROT 5.7 (L) 01/22/2020   ALBUMIN 2.4 (L) 01/22/2020   BILITOT 0.6 01/22/2020   ALKPHOS 47 01/22/2020   AST 12 (L) 01/22/2020   ALT 15 01/22/2020   ANIONGAP 12 01/22/2020    CBG (last 3)  Recent Labs    01/25/20 1617 01/25/20 2041 01/26/20 0753  GLUCAP 216* 227* 156*     GFR: Estimated Creatinine Clearance: 72.6 mL/min (A) (by C-G formula based on SCr of 0.3 mg/dL (L)).  Coagulation Profile: Recent Labs  Lab 01/19/20 1241  INR 1.3*    Recent Results (from the past 240 hour(s))  Urine culture     Status: Abnormal   Collection Time: 01/19/20 12:41 PM   Specimen: In/Out Cath Urine  Result Value Ref Range Status   Specimen Description   Final    IN/OUT CATH URINE Performed at [facility], [address], [place], [place] [address]    Special Requests   Final    NONE Performed at [facility], [address] [place], [place] [address]    Culture >=100,000 COLONIES/mL YEAST (A)  Final   Report Status 01/20/2020 FINAL  Final  SARS Coronavirus 2 by RT PCR (hospital order, performed in [facility] hospital lab) Nasopharyngeal Nasopharyngeal Swab     Status: None   Collection Time: 01/19/20 12:42 PM   Specimen: Nasopharyngeal Swab  Result Value Ref Range Status   SARS Coronavirus 2 NEGATIVE NEGATIVE Final    Comment: (NOTE) SARS-CoV-2 target nucleic acids are NOT DETECTED. The SARS-CoV-2 RNA is generally detectable in upper and lower respiratory specimens during the acute phase of  infection. The lowest concentration of SARS-CoV-2 viral copies this assay can detect is 250 copies / mL. A negative result does not preclude SARS-CoV-2 infection and should not be used as the sole basis for treatment or other patient management decisions.  A negative result may occur with improper specimen collection / handling, submission of specimen other than nasopharyngeal swab, presence of viral mutation(s) within the areas targeted by this assay, and inadequate number of viral copies (<250 copies / mL). A negative result must be combined with clinical observations, patient history, and epidemiological information. Fact Sheet for Patients:   BoilerBrush.com.cy Fact Sheet for Healthcare Providers: https://pope.com/ This test is not yet approved or cleared  by the [place] FDA and has been authorized for detection and/or diagnosis of SARS-CoV-2 by FDA under an Emergency Use Authorization (EUA).  This EUA will remain in effect (meaning this test can be used) for the  duration of the COVID-19 declaration under Section 564(b)(1) of the Act, 21 U.S.C. section 360bbb-3(b)(1), unless the authorization is terminated or revoked sooner. Performed at [facility], [address], [place], [place] [address]   Blood Culture (routine x 2)     Status: None   Collection Time: 01/19/20 12:46 PM   Specimen: BLOOD  Result Value Ref Range Status   Specimen Description   Final    BLOOD LEFT ANTECUBITAL Performed at [facility], [address], [place], [place] [address]    Special Requests   Final    BOTTLES DRAWN AEROBIC AND ANAEROBIC Blood Culture results may not be optimal due to an inadequate volume of blood received in culture bottles Performed at [facility], [address], [place], [place] [address]    Culture   Final    NO GROWTH 5 DAYS Performed at [facility], [address], [place], [place] [address]    Report Status 01/24/2020 FINAL  Final  Blood Culture (routine x 2)     Status: None   Collection Time: 01/19/20 12:51 PM   Specimen: BLOOD  Result Value Ref Range Status   Specimen Description   Final    BLOOD RIGHT ANTECUBITAL Performed at [facility], [address], [place], [place] [address]    Special Requests   Final    BOTTLES DRAWN AEROBIC AND ANAEROBIC Blood Culture adequate volume Performed at [facility], [address], [place], [place] [address]    Culture   Final    NO GROWTH 5 DAYS Performed at [facility], [address] [place], [place] [address]    Report Status 01/24/2020 FINAL  Final  MRSA PCR Screening     Status: None   Collection Time: 01/19/20  4:00 PM   Specimen: Nasopharyngeal  Result Value Ref Range Status   MRSA by PCR NEGATIVE NEGATIVE Final    Comment:        The GeneXpert MRSA Assay (FDA approved for NASAL specimens only), is one component of a comprehensive MRSA colonization surveillance program. It is not intended to diagnose MRSA infection nor to guide or monitor treatment for MRSA infections. Performed at [facility], [address] [place], [place] [address]   SARS Coronavirus 2 by RT PCR (hospital order, performed in [facility] hospital lab) Nasopharyngeal Nasopharyngeal Swab     Status: None   Collection Time: 01/23/20  2:18 PM   Specimen: Nasopharyngeal Swab  Result Value Ref Range Status   SARS Coronavirus 2 NEGATIVE NEGATIVE Final    Comment: (NOTE) SARS-CoV-2 target nucleic acids are NOT DETECTED.  The SARS-CoV-2 RNA is generally detectable in upper and lower respiratory specimens during the acute phase of infection. The lowest concentration of SARS-CoV-2 viral copies this assay can detect is 250 copies / mL. A negative result does not preclude SARS-CoV-2 infection and should not be used as the sole basis for treatment or  other patient management decisions.  A negative result may occur with improper specimen collection / handling, submission of specimen other than nasopharyngeal swab, presence of viral mutation(s) within the areas targeted by this assay, and inadequate number of viral copies (<250 copies / mL). A negative result must be combined with clinical observations, patient history, and epidemiological information.  Fact Sheet for Patients:   BoilerBrush.com.cy  Fact Sheet for Healthcare Providers: https://pope.com/  This test is not yet approved or  cleared  by the [place] and has been authorized for detection and/or diagnosis of SARS-CoV-2 by FDA under an Emergency Use Authorization (EUA).  This EUA will remain in effect (meaning this test can be used) for the duration of the COVID-19 declaration under Section 564(b)(1) of the Act, 21 U.S.C. section 360bbb-3(b)(1), unless the authorization is terminated or revoked sooner.  Performed at [facility], [address] [place], [place] [address]         Radiology Studies: No results found.      Scheduled Meds: . apixaban  5 mg Oral BID  . atorvastatin  10 mg Oral QHS  . carvedilol  12.5 mg Oral BID WC  . divalproex  500 mg Oral Q12H  . furosemide  40 mg Oral Daily  . hydrALAZINE  25 mg Oral Q8H  . insulin aspart  0-15 Units Subcutaneous TID WC  . insulin aspart  0-5 Units Subcutaneous QHS  . insulin glargine  15 Units Subcutaneous QHS  . losartan  50 mg Oral Daily  . mouth rinse  15 mL Mouth Rinse BID  . multivitamin with minerals  1 tablet Oral Daily  . sodium chloride flush  10-40 mL Intracatheter Q12H   Continuous Infusions:   LOS: 7 days     Cordelia Poche, MD [facility] 01/26/2020, 8:30 AM  If 7PM-7AM, please contact night-coverage [email or web address]

## 2020-01-26 NOTE — Progress Notes (Signed)
Nutrition Follow-up  DOCUMENTATION CODES:   Obesity unspecified  INTERVENTION:  -Ensure Max po BID, each supplement provides 150 kcal and 30 grams of protein.  -1 packet Juven BID, each packet provides 95 calories, 2.5 grams of protein (collagen), and 9.8 grams of carbohydrate (3 grams sugar); also contains 7 grams of L-arginine and L-glutamine, 300 mg vitamin C, 15 mg vitamin E, 1.2 mcg vitamin B-12, 9.5 mg zinc, 200 mg calcium, and 1.5 g  Calcium Beta-hydroxy-Beta-methylbutyrate to support wound healing  NUTRITION DIAGNOSIS:   Inadequate oral intake related to inability to eat as evidenced by NPO status.  Progressing, pt now on carb modified diet  GOAL:   Provide needs based on ASPEN/SCCM guidelines  Progressing.   MONITOR:   Vent status, TF tolerance, Labs, Weight trends, Skin  REASON FOR ASSESSMENT:   Ventilator, Consult Enteral/tube feeding initiation and management  ASSESSMENT:   Pt with a PMH significant for bipolar disorder, CHF, HTN, Afib, osteomyelitis of the coccyx, and T2DM presented 2/2 mental status change and found to have acute respiratory failure with hypoxia and hypercapnia. CXR showed significant bilateral infiltrates and pleural effusions.  6/8 intubated 6/9 extubated  Per MD, pt stable for discharge back to SNF once bed is available.   Discussed pt with RN.  Pt with good appetite.  RD will order oral nutrition supplements to increase protein/calorie intake due to pt's increased needs for wound healing. 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    Pt with varied weight history, likely due to fluctuations in fluid status.   PO Intake: 50-100% x last 8 recorded meals (90% average meal intake)  Medications: Lasix, Novolog, Lantus, MVI Labs reviewed. CBGs 156-227  UOP: x24 hours I/O: -4,174.34ml since admit  Diet Order:   Diet Order            Diet Carb Modified Fluid consistency: Thin; Room service appropriate? Yes   Diet effective now                 EDUCATION NEEDS:   No education needs have been identified at this time  Skin:  Skin Assessment: Skin Integrity Issues: Skin Integrity Issues:: Stage IV Stage IV: L buttocks x2, sacrum  Last BM:  6/13  Height:   Ht Readings from Last 1 Encounters:  01/22/20 5\' 6"  (1.676 m)    Weight:   Wt Readings from Last 10 Encounters:  01/25/20 91.7 kg  01/06/20 71.7 kg  12/10/19 110 kg  09/20/19 110.8 kg  09/07/19 100 kg    BMI:  Body mass index is 32.61 kg/m.  Estimated Nutritional Needs:   Kcal:  1900-2100  Protein:  100-120 grams  Fluid:  >1.9L/d    09/09/19, MS, RD, LDN RD pager number and weekend/on-call pager number located in Amion.

## 2020-01-27 ENCOUNTER — Non-Acute Institutional Stay: Payer: Medicare Other | Admitting: Internal Medicine

## 2020-01-27 LAB — GLUCOSE, CAPILLARY
Glucose-Capillary: 190 mg/dL — ABNORMAL HIGH (ref 70–99)
Glucose-Capillary: 221 mg/dL — ABNORMAL HIGH (ref 70–99)

## 2020-01-27 MED ORDER — JUVEN PO PACK: 1.0000 | PACK | Freq: Two times a day (BID) | ORAL | 0 refills | Status: DC

## 2020-01-27 MED ORDER — ENSURE MAX PROTEIN PO LIQD: 11.0000 [oz_av] | Freq: Two times a day (BID) | ORAL | Status: DC

## 2020-01-27 NOTE — Progress Notes (Addendum)
Report called to [facility] [phone]. Transferred to [facility]. No answer at this time, will follow up.

## 2020-01-27 NOTE — Progress Notes (Addendum)
Second call placed to [facility] [phone]. Transferred to [place], no answer. Secretary whom answered call, stated she would make staff aware of residents return. RN number left for a return call for updated report.  [facility] is at bedside for transfer. No change from am assessment. Dressing clean dry intact.

## 2020-01-27 NOTE — TOC Transition Note (Signed)
Transition of Care [facility]) - CM/SW Discharge Note   Patient Details  Name: Becky Hendricks MRN: [id number] Date of Birth: Apr 28, 1946  Transition of Care [facility]) CM/SW Contact:  Darleene Cleaver, LCSW Phone Number: 01/27/2020, 12:45 PM   Clinical Narrative:     Patient to be d/c'ed today to [facility] room 806B.  Patient and family agreeable to plans will transport via ems RN to call report [phone].  CSW spoke to patient's [name] and informed him that patient is discharging back to [facility] today.  Patient's brother was appreciative of information given.  Final next level of care: Skilled Nursing Facility Barriers to Discharge: Barriers Resolved   Patient Goals and CMS Choice Patient states their goals for this hospitalization and ongoing recovery are:: To return back to [place] to continue with short term rehab. CMS Medicare.gov Compare Post Acute Care list provided to:: Patient Choice offered to / list presented to : Patient  Discharge Placement PASRR number recieved: 01/26/20            Patient chooses bed at: [facility] Patient to be transferred to facility by: [facility] Name of family member notified: Patient's [name] [phone] Patient and family notified of of transfer: 01/27/20  Discharge Plan and Services  Patient discharging back to [facility] to continue with rehab.                DME Agency: NA       HH Arranged: NA          Social Determinants of Health (SDOH) Interventions     Readmission Risk Interventions No flowsheet data found.

## 2020-01-27 NOTE — TOC Progression Note (Signed)
Transition of Care [facility]) - Progression Note    Patient Details  Name: Becky Hendricks MRN: [id number] Date of Birth: 1945-10-23  Transition of Care [facility]) CM/SW Contact  Darleene Cleaver, [place] Phone Number: 01/27/2020, 11:44 AM  Clinical Narrative:     CSW was informed that patient may be ready for discharge today.  CSW spoke to [facility], and they said she can return today if she is medically ready for discharge.  Per [facility], patient does not need a new Covid test, CSW updated attending physician.  Patient is currently being followed by [facility] for palliative at SNF.     Expected Discharge Plan and Services  Patient plans to discharge back to SNF.         Expected Discharge Date: 01/23/20                                     Social Determinants of Health (SDOH) Interventions    Readmission Risk Interventions No flowsheet data found.

## 2020-01-27 NOTE — Discharge Summary (Signed)
DISCHARGE SUMMARY  Becky Hendricks  MR#: [id number]  DOB:[birth year]  Date of Admission: 01/19/2020 Date of Discharge: 01/27/2020  Attending Physician:Breanna Shorkey Silvestre Gunner, MD  Patient's WLN:[id number], Misty Stanley, MD  Consults: none   Disposition: D/C to SNF   Follow-up Appts:  Follow-up Information    SNF Attending of Record Follow up.   Why: Ongoing medical care will be provided by the Attending of Record at your SNF of choice.              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  She required emergent intubation and was admitted to the ICU for mechanical ventilation.  Hospital Course:  Acute hypoxic and hypercapnic respiratory failure -chronic hypoxic respiratory failure Felt to be due to acute CHF exacerbation/pulmonary edema - patient was quickly weaned from the ventilator and remained stable on 2 L nasal cannula (her baseline oxygen support) at the time of her d/c   Acute exacerbation of chronic diastolic CHF EF via TTE 65-70% - has been weaned back to her baseline Lasix dose prior to d/c- Coreg dosing continues - net negative approximately 5 L this admission with improvement in resp sx   Transient  fever Patient experienced an isolated temperature of 100.4 F late night/early morning 6/14 > 6/15 -her hospital stay was extended to monitor this given her complex recent hx of infection - she did not experience any recurrent fever and there was no persisting evidence of an active infection at the time of her d/c   Acute metabolic encephalopathy -altered mental status Due to hypercarbic respiratory failure - resolved w/ pt returned to her baseline of fluctuating mental acuity   Paroxysmal atrial fibrillation Continue Coreg and Eliquis - HR controlled   Osteomyelitis of the coccyx - buttock abscess s/p recent I&D Present on admission - was being dosed with vancomycin and Unasyn as an outpatient with the last scheduled date of treatment 6/10 - PICC line discontinued as antibiotic course has been completed - to f/u w/ ID as outpt as previously arranged  DM2 Lantus utilize during inpatient stay -resume usual outpatient regimen at time of discharge  Seizure disorder No changes been made in her usual Depakote dosing  Bipolar Disorder No agitation or mania at time of d/c   HLD Continue Lipitor as per home dose  HTN Continue losartan without change  Chronic Foley catheter This has leaked frequently during her hospital stay -catheter was removed 6/15 - will need close care to assure she does not develop moisture associated worsening of her buttocks/sacral region   MRSA screen +  Allergies as of 01/27/2020      Reactions   Chlorhexidine Gluconate Itching   Acetazolamide Er Rash      Medication List    STOP taking these medications   ampicillin-sulbactam  IVPB Commonly known as: UNASYN   Ensure Max Protein Liqd Replaced  by: nutrition supplement (JUVEN) Pack   predniSONE 20 MG tablet Commonly known as: DELTASONE   vancomycin  IVPB     TAKE these medications   acetaminophen 325 MG tablet Commonly known as: TYLENOL Take 650 mg by mouth every 6 (six) hours as needed  for mild pain or headache.   apixaban 5 MG Tabs tablet Commonly known as: ELIQUIS Take 5 mg by mouth 2 (two) times daily.   ascorbic acid 1000 MG tablet Commonly known as: VITAMIN C Take 500 mg by mouth 2 (two) times daily.   atorvastatin 10 MG tablet Commonly known as: LIPITOR Take 10 mg by mouth at bedtime.   calcium carbonate 500 MG chewable tablet Commonly known as: TUMS - dosed in mg elemental calcium Chew 1 tablet by mouth daily.   carvedilol 12.5 MG tablet Commonly known as: COREG Take 1 tablet (12.5 mg total) by mouth 2 (two) times daily with a meal. What changed:   medication strength  how much to take   diphenhydrAMINE-zinc acetate cream Commonly known as: BENADRYL Apply 1 application topically 3 (three) times daily as needed for itching.   divalproex 500 MG DR tablet Commonly known as: DEPAKOTE Take 1 tablet (500 mg total) by mouth 2 (two) times daily.   docusate sodium 100 MG capsule Commonly known as: COLACE Take 1 capsule (100 mg total) by mouth 2 (two) times daily as needed for mild constipation.   famotidine 20 MG tablet Commonly known as: PEPCID Take 20 mg by mouth 2 (two) times daily.   feeding supplement (PRO-STAT SUGAR FREE 64) Liqd Take 30 mLs by mouth 2 (two) times daily.   furosemide 40 MG tablet Commonly known as: LASIX Take 1 tablet (40 mg total) by mouth daily.   HumaLOG 100 UNIT/ML cartridge Generic drug: insulin lispro Inject into the skin See admin instructions. Inject into the skin 2 times a day between meals, per sliding scale: BGL 0-200 = give nothing; 201-250 = 2 units; 251-300 = 4 units; 301-350 = 6 units; 351-400 = 8 units; 401-450 = 10 units; 451-500 = 12 units; >450 = 12 units and CALL PROVIDER IF STILL ELEVATED AFTER 2 HOURS   hydrALAZINE 25 MG tablet Commonly known as: APRESOLINE Take 1 tablet (25 mg total) by mouth every 8 (eight) hours.   hydrocortisone cream 1 % Apply topically 2 (two) times daily.   insulin  glargine 100 UNIT/ML injection Commonly known as: LANTUS Inject 0.15 mLs (15 Units total) into the skin at bedtime. What changed:   how much to take  when to take this   Ipratropium-Albuterol 20-100 MCG/ACT Aers respimat Commonly known as: COMBIVENT Inhale 1 puff into the lungs every 6 (six) hours. What changed:   when to take this  reasons to take this   Janumet 50-1000 MG tablet Generic drug: sitaGLIPtin-metformin Take 1 tablet by mouth 2 (two) times daily.   losartan 50 MG tablet Commonly known as: COZAAR Take 1 tablet (50 mg total) by mouth daily. What changed:   medication strength  how much to take   multivitamin tablet Take 1 tablet by mouth daily.   nutrition supplement (JUVEN) Pack Take 1 packet by mouth 2 (two) times daily between meals. Replaces: Ensure Max Protein Liqd   Ensure Max Protein Liqd Take 330 mLs (11 oz total) by mouth 2 (two) times daily.   pantoprazole 40 MG tablet Commonly known as: PROTONIX Take 1 tablet (40 mg total) by mouth 2 (two) times daily.   sennosides-docusate sodium  8.6-50 MG tablet Commonly known as: SENOKOT-S Take 1 tablet by mouth daily.   Vitamin D 125 MCG (5000 UT) Caps Take 10,000 Units by mouth once a week. Wednesday   Zinc Gluconate 100 MG Tabs Take 220 mg by mouth daily.       Day of Discharge BP 112/61 (BP Location: Left Arm)   Pulse (!) 57   Temp 98.1 F (36.7 C) (Oral)   Resp 18   Ht 5\' 6"  (1.676 m)   Wt 92.6 kg   LMP  (LMP Unknown)   SpO2 95%   BMI 32.95 kg/m   Physical Exam: General: No acute respiratory distress Lungs: Clear to auscultation bilaterally except for mild bibasilar crackles  Cardiovascular: Regular rate without murmur  Abdomen: Nontender, obese,nondistended, soft, bowel sounds positive, no rebound Extremities: No significant cyanosis, clubbing, or edema bilateral lower extremities  Basic Metabolic Panel: Recent Labs  Lab 01/21/20 0500 01/22/20 0436 01/26/20 0914  NA 139  137 140  K 4.2 3.8 4.0  CL 86* 85* 97*  CO2 44* 40* 32  GLUCOSE 115* 170* 192*  BUN 12 11 13   CREATININE <0.30* 0.30* 0.36*  CALCIUM 8.0* 8.2* 8.7*  MG 2.9* 2.0  --   PHOS 3.3  --   --     Liver Function Tests: Recent Labs  Lab 01/21/20 0500 01/22/20 0436 01/26/20 0914  AST 11* 12* 17  ALT 10 15 19   ALKPHOS 45 47 49  BILITOT 0.6 0.6 0.5  PROT 5.5* 5.7* 6.1*  ALBUMIN 2.4* 2.4* 2.8*   CBC: Recent Labs  Lab 01/26/20 0914  WBC 8.4  HGB 10.9*  HCT 36.0  MCV 96.3  PLT 200    BNP (last 3 results) Recent Labs    09/07/19 1950 12/29/19 0451 01/19/20 1251  BNP 37.6 183.9* 728.1*    CBG: Recent Labs  Lab 01/26/20 1258 01/26/20 1650 01/26/20 2027 01/27/20 0751 01/27/20 1154  GLUCAP 206* 118* 289* 190* 221*    Recent Results (from the past 240 hour(s))  Urine culture     Status: Abnormal   Collection Time: 01/19/20 12:41 PM   Specimen: In/Out Cath Urine  Result Value Ref Range Status   Specimen Description   Final    IN/OUT CATH URINE Performed at [facility], [address] [place], [place] [address]    Special Requests   Final    NONE Performed at [facility], [address] [place], [place] [address]    Culture >=100,000 COLONIES/mL YEAST (A)  Final   Report Status 01/20/2020 FINAL  Final  SARS Coronavirus 2 by RT PCR (hospital order, performed in [facility] hospital lab) Nasopharyngeal Nasopharyngeal Swab     Status: None   Collection Time: 01/19/20 12:42 PM   Specimen: Nasopharyngeal Swab  Result Value Ref Range Status   SARS Coronavirus 2 NEGATIVE NEGATIVE Final    Comment: (NOTE) SARS-CoV-2 target nucleic acids are NOT DETECTED. The SARS-CoV-2 RNA is generally detectable in upper and lower respiratory specimens during the acute phase of infection. The lowest concentration of SARS-CoV-2 viral copies this assay can detect is 250 copies / mL. A negative result does not preclude SARS-CoV-2 infection and  should not be used as the sole basis for treatment or other patient management decisions.  A negative result may occur with improper specimen collection / handling, submission of specimen other than nasopharyngeal swab, presence of viral mutation(s) within the areas targeted by this assay, and inadequate number of viral  copies (<250 copies / mL). A negative result must be combined with clinical observations, patient history, and epidemiological information. Fact Sheet for Patients:   BoilerBrush.com.cy Fact Sheet for Healthcare Providers: https://pope.com/ This test is not yet approved or cleared  by the [place] FDA and has been authorized for detection and/or diagnosis of SARS-CoV-2 by FDA under an Emergency Use Authorization (EUA).  This EUA will remain in effect (meaning this test can be used) for the duration of the COVID-19 declaration under Section 564(b)(1) of the Act, 21 U.S.C. section 360bbb-3(b)(1), unless the authorization is terminated or revoked sooner. Performed at [facility], [address], [place], [place] [address]   Blood Culture (routine x 2)     Status: None   Collection Time: 01/19/20 12:46 PM   Specimen: BLOOD  Result Value Ref Range Status   Specimen Description   Final    BLOOD LEFT ANTECUBITAL Performed at [facility], [address], [place], [place] [address]    Special Requests   Final    BOTTLES DRAWN AEROBIC AND ANAEROBIC Blood Culture results may not be optimal due to an inadequate volume of blood received in culture bottles Performed at [facility], [address], [place], [place] [address]    Culture   Final    NO GROWTH 5 DAYS Performed at [facility], [address], [place], [place] [address]    Report Status 01/24/2020 FINAL  Final  Blood Culture (routine x 2)     Status: None   Collection Time: 01/19/20 12:51 PM    Specimen: BLOOD  Result Value Ref Range Status   Specimen Description   Final    BLOOD RIGHT ANTECUBITAL Performed at [facility], [address], [place], [place] [address]    Special Requests   Final    BOTTLES DRAWN AEROBIC AND ANAEROBIC Blood Culture adequate volume Performed at [facility], [address] [place], [place] [address]    Culture   Final    NO GROWTH 5 DAYS Performed at [facility], [address] [place], [place] [address]    Report Status 01/24/2020 FINAL  Final  MRSA PCR Screening     Status: None   Collection Time: 01/19/20  4:00 PM   Specimen: Nasopharyngeal  Result Value Ref Range Status   MRSA by PCR NEGATIVE NEGATIVE Final    Comment:        The GeneXpert MRSA Assay (FDA approved for NASAL specimens only), is one component of a comprehensive MRSA colonization surveillance program. It is not intended to diagnose MRSA infection nor to guide or monitor treatment for MRSA infections. Performed at [facility], [address], [place], [place] [address]   SARS Coronavirus 2 by RT PCR (hospital order, performed in [facility] hospital lab) Nasopharyngeal Nasopharyngeal Swab     Status: None   Collection Time: 01/23/20  2:18 PM   Specimen: Nasopharyngeal Swab  Result Value Ref Range Status   SARS Coronavirus 2 NEGATIVE NEGATIVE Final    Comment: (NOTE) SARS-CoV-2 target nucleic acids are NOT DETECTED.  The SARS-CoV-2 RNA is generally detectable in upper and lower respiratory specimens during the acute phase of infection. The lowest concentration of SARS-CoV-2 viral copies this assay can detect is 250 copies / mL. A negative result does not preclude SARS-CoV-2 infection and should not be used as the sole basis for treatment or other patient management decisions.  A negative result may occur with improper specimen collection / handling, submission of specimen other than nasopharyngeal  swab, presence of viral mutation(s) within the areas targeted by this assay, and inadequate number of viral copies (<250 copies / mL). A negative result must be combined with clinical observations, patient history, and epidemiological information.  Fact Sheet for Patients:   BoilerBrush.com.cy  Fact Sheet for Healthcare Providers: https://pope.com/  This test is not yet approved or  cleared by the [place] FDA and has been authorized for detection and/or diagnosis of SARS-CoV-2 by FDA under an Emergency Use Authorization (EUA).  This EUA will remain in effect (meaning this test can be used) for the duration of the COVID-19 declaration under Section 564(b)(1) of the Act, 21 U.S.C. section 360bbb-3(b)(1), unless the authorization is terminated or revoked sooner.  Performed at [facility], [address] [place], [place] [address]       Time spent in discharge (includes decision making & examination of pt): 35 minutes  01/27/2020, 11:57 AM   Lonia Blood, MD [facility] Office  [phone]

## 2020-01-27 NOTE — Discharge Instructions (Signed)
Pulmonary Edema Pulmonary edema is a condition in which fluid collects in the air sacs of the lung. This makes it hard for the lungs to fill with air. It also prevents the lungs from moving oxygen into the bloodstream, which can affect other organs, such as the brain and kidneys. Pulmonary edema is an emergency and should be treated immediately. There are two main types of pulmonary edema:  Cardiogenic. This means the pulmonary edema was caused by a problem with the heart.  Noncardiogenic. This means the pulmonary edema was caused by something other than the heart, such as an injury to the lung. What are the causes? This condition is commonly caused by heart failure. When this happens, the heart is not able to properly pump blood through the body. This can lead to increased pressure in the heart and blood building up in the veins around the lungs. When blood builds up in these veins, fluid gets pushed into the air sacs of the lung. Heart failure may be caused by:  Coronary artery disease.  High blood pressure.  Viral infection of the heart (myocarditis).  Leaky or stiff heart valves.  Irregular heartbeat (arrhythmia).  Fluid buildup caused by kidney problems. Other causes include:  Infection in the lungs (pneumonia), blood (sepsis), or other part of the body.  Severe injury to the chest.  Lung injury from heat or toxins, such as breathing in smoke or poisonous gas.  Inhaling vomit or water (pulmonaryaspiration).  Certain medicines.  High altitude. What are the signs or symptoms? Symptoms of this condition include:  Shortness of breath.  Coughing with frothy or bloody mucus.  Wheezing.  Feeling like you cannot get enough air.  Shallow and fast breathing.  Skin that is cool and damp, and has a pale or bluish color. How is this diagnosed? This condition is diagnosed based on:  Your medical history.  A physical exam.  Your symptoms. You may also have other tests,  including:  Chest X-ray.  Chest CT scan.  Blood tests, including checking the amount of oxygen in the blood.  Sputum culture. This test checks for infection in the mucus that you cough up from your lungs.  Electrocardiogram. This measures the electrical signals of the heart.  Echocardiogram. This uses an ultrasound to evaluate the health of the heart. How is this treated? Initial treatment for this condition focuses on relieving your symptoms. Treatment depends on the underlying cause of the condition. This may include:  Oxygen therapy. The oxygen may be given through tubes in your nose or through a face mask. In severe cases, a breathing tube is inserted into the windpipe and hooked up to a breathing machine (ventilator).  Medicines. These may include medicines to: ? Help the body get rid of extra water (diuretics). ? Help the heart pump blood properly. ? Prevent or destroy blood clots. If poor heart function is the cause, treatment may also include:  Procedures to open blocked arteries, repair damaged heart valves, or remove some of the damaged heart muscle.  A pacemaker to help with heart function.  A procedure that uses electric shocks to regulate heart rate (cardioversion). If an infection is the cause, treatment may include antibiotic medicines. Follow these instructions at home: Medicines  Take over-the-counter and prescription medicines only as told by your health care provider.  If you were prescribed an antibiotic, take it as told by your health care provider. Do not stop taking the antibiotic even if you start to feel better.    Have a plan with information about each medicine you take. This should include: ? Why you take the medicine. ? Possible side effects. ? Best time of day to take it. ? Foods to take with it, or foods to avoid when taking it. ? When to call your health care provider.  Make a list of each medicine, vitamin, or herbal supplement you take. Keep  the list with you at all times. Show it to your health care provider at each visit and before starting a new medicine. Update the list as you add or stop medicines. Lifestyle   Exercise regularly as told by your health care provider. It is important to do it safely. You can do this by: ? Pacing your activities to avoid shortness of breath or chest pain. ? Resting for at least 1 hour before and after meals. ? Asking about cardiac rehabilitation programs. These may include education, exercise plans, and counseling.  Eat a heart-healthy diet that is low in salt (sodium), saturated fat, and cholesterol. Your health care provider may recommend foods that are high in fiber, such as fresh fruits and vegetables, whole grains, and beans.  Do not use any products that contain nicotine or tobacco, such as cigarettes and e-cigarettes. If you need help quitting, ask your health care provider. General instructions  Maintain a healthy weight.  Keep a record of your weight: ? Record your hospital or clinic weight. When you get home, compare it to your scale and record your weight. ? Weigh yourself first thing each morning after you urinate and before you eat breakfast. Wear the same amount of clothing each time. Record the weights. ? Share your weight record with your health care provider. Daily weights are important in detecting the body's retention of excess fluid. ? Tell your health care provider right away if you gain weight quickly. Your medicines may need to be adjusted.  Check and record your blood pressure as often as told by your health care provider. Bring the records with you to clinic visits.  Consider therapy or joining a support group. This may help with any stress, fear, or anxiety.  Keep all follow-up visits as told by your health care provider. This is important. Get help right away if:  You gain weight quickly.  You have severe chest pain, especially if the pain is crushing or  pressure-like and spreads to the arms, back, neck, or jaw.  You have more swelling in your hands, feet, ankles, or abdomen.  You have nausea.  You have unusual sweating or your skin turns blue or pale.  Your shortness of breath gets worse.  You have dizziness, blurred vision, a headache, or unsteadiness.  Your blood pressure is higher than 180/120.  You cough up bloody mucus (sputum).  You cannot sleep because it is hard to breathe.  You feel a racing heart beat (palpitations).  You have anxiety or a feeling that you cannot get enough air. These symptoms may represent a serious problem that is an emergency. Do not wait to see if the symptoms will go away. Get medical help right away. Call your local emergency services (911 in the [place] Do not drive yourself to the hospital. Summary  Pulmonary edema is a condition in which fluid collects in the air sacs of your lungs. If left untreated, it can lead to a medical emergency.  This condition is most commonly caused by heart failure. Other causes can include infections or injury to the lungs.  Take over-the-counter   and prescription medicines only as told by your health care provider. This information is not intended to replace advice given to you by your health care provider. Make sure you discuss any questions you have with your health care provider. Document Revised: 07/12/2017 Document Reviewed: 10/10/2016 Elsevier Patient Education  2020 [employer]  

## 2020-01-28 ENCOUNTER — Other Ambulatory Visit: Payer: Self-pay

## 2020-02-01 ENCOUNTER — Emergency Department (HOSPITAL_COMMUNITY)
Admission: EM | Admit: 2020-02-01 | Discharge: 2020-02-02 | Disposition: A | Discharge status: Home / Self Care | Payer: Medicare Other | Attending: Emergency Medicine | Admitting: Emergency Medicine

## 2020-02-01 ENCOUNTER — Emergency Department (HOSPITAL_COMMUNITY): Payer: Medicare Other

## 2020-02-01 DIAGNOSIS — Z7901 Long term (current) use of anticoagulants: Secondary | ICD-10-CM

## 2020-02-01 DIAGNOSIS — Y9389 Activity, other specified: Secondary | ICD-10-CM | POA: Insufficient documentation

## 2020-02-01 DIAGNOSIS — D649 Anemia, unspecified: Secondary | ICD-10-CM | POA: Diagnosis not present

## 2020-02-01 DIAGNOSIS — Z043 Encounter for examination and observation following other accident: Secondary | ICD-10-CM | POA: Diagnosis present

## 2020-02-01 DIAGNOSIS — W06XXXA Fall from bed, initial encounter: Secondary | ICD-10-CM | POA: Diagnosis not present

## 2020-02-01 DIAGNOSIS — Y92122 Bedroom in nursing home as the place of occurrence of the external cause: Secondary | ICD-10-CM | POA: Insufficient documentation

## 2020-02-01 DIAGNOSIS — Y998 Other external cause status: Secondary | ICD-10-CM | POA: Insufficient documentation

## 2020-02-01 LAB — CBC
HCT: 36.2 % (ref 36.0–46.0)
Hemoglobin: 10.7 g/dL — ABNORMAL LOW (ref 12.0–15.0)
MCH: 28.8 pg (ref 26.0–34.0)
MCHC: 29.6 g/dL — ABNORMAL LOW (ref 30.0–36.0)
MCV: 97.3 fL (ref 80.0–100.0)
Platelets: 232 10*3/uL (ref 150–400)
RBC: 3.72 MIL/uL — ABNORMAL LOW (ref 3.87–5.11)
RDW: 16.8 % — ABNORMAL HIGH (ref 11.5–15.5)
WBC: 8.4 10*3/uL (ref 4.0–10.5)
nRBC: 0 % (ref 0.0–0.2)

## 2020-02-01 LAB — COMPREHENSIVE METABOLIC PANEL
ALT: 19 U/L (ref 0–44)
AST: 15 U/L (ref 15–41)
Albumin: 2.6 g/dL — ABNORMAL LOW (ref 3.5–5.0)
Alkaline Phosphatase: 51 U/L (ref 38–126)
Anion gap: 11 (ref 5–15)
BUN: 12 mg/dL (ref 8–23)
CO2: 34 mmol/L — ABNORMAL HIGH (ref 22–32)
Calcium: 8.6 mg/dL — ABNORMAL LOW (ref 8.9–10.3)
Chloride: 96 mmol/L — ABNORMAL LOW (ref 98–111)
Creatinine, Ser: 0.53 mg/dL (ref 0.44–1.00)
GFR calc Af Amer: >60 mL/min (ref >60)
GFR calc non Af Amer: >60 mL/min (ref >60)
Glucose, Bld: 191 mg/dL — ABNORMAL HIGH (ref 70–99)
Potassium: 3.6 mmol/L (ref 3.5–5.1)
Sodium: 141 mmol/L (ref 135–145)
Total Bilirubin: 0.5 mg/dL (ref 0.3–1.2)
Total Protein: 5.8 g/dL — ABNORMAL LOW (ref 6.5–8.1)

## 2020-02-01 LAB — I-STAT CHEM 8, ED
BUN: 13 mg/dL (ref 8–23)
Calcium, Ion: 1.22 mmol/L (ref 1.15–1.40)
Chloride: 93 mmol/L — ABNORMAL LOW (ref 98–111)
Creatinine, Ser: 0.6 mg/dL (ref 0.44–1.00)
Glucose, Bld: 190 mg/dL — ABNORMAL HIGH (ref 70–99)
HCT: 32 % — ABNORMAL LOW (ref 36.0–46.0)
Hemoglobin: 10.9 g/dL — ABNORMAL LOW (ref 12.0–15.0)
Potassium: 3.6 mmol/L (ref 3.5–5.1)
Sodium: 141 mmol/L (ref 135–145)
TCO2: 38 mmol/L — ABNORMAL HIGH (ref 22–32)

## 2020-02-01 LAB — ETHANOL: Alcohol, Ethyl (B): <10 mg/dL (ref <10)

## 2020-02-01 LAB — LACTIC ACID, PLASMA: Lactic Acid, Venous: 2 mmol/L (ref 0.5–1.9)

## 2020-02-01 LAB — PROTIME-INR
INR: 1.1 (ref 0.8–1.2)
Prothrombin Time: 13.3 seconds (ref 11.4–15.2)

## 2020-02-01 IMAGING — CT CT HEAD W/O CM
4 series · 15 of 47 positions shown, 17 images · non-contrast
Comparison: None.

CLINICAL DATA: Polytrauma

EXAM:
CT HEAD WITHOUT CONTRAST
TECHNIQUE: Contiguous axial images were obtained from the base of the skull
through the vertex without intravenous contrast.

[Series 3: head wo · axial · 0.45mm/px · z∈[-178,-58]mm · 7 of 32 slices shown, 9 images]
[im 4/32  brain]
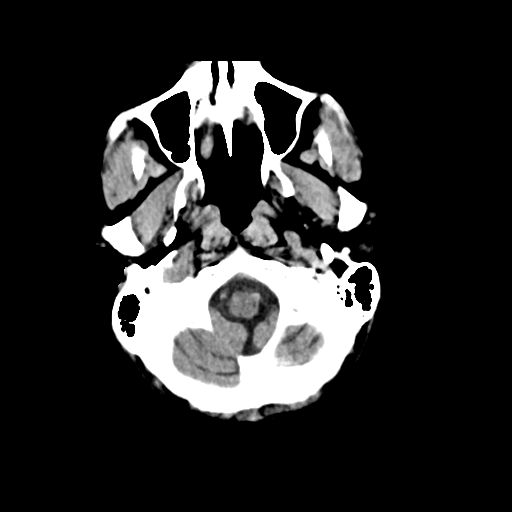
[im 4/32  bone]
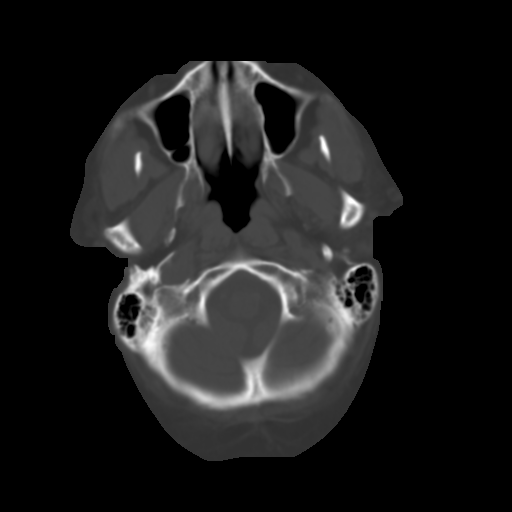
[im 8/32  brain]
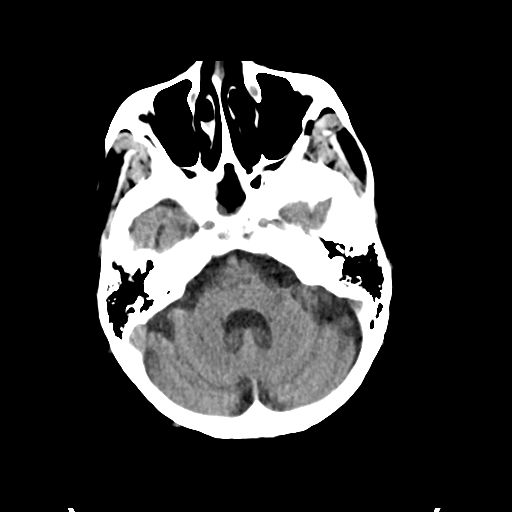
[im 12/32  brain]
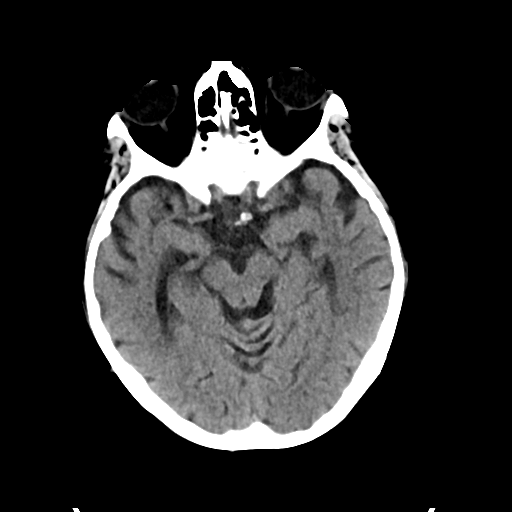
[im 16/32  brain]
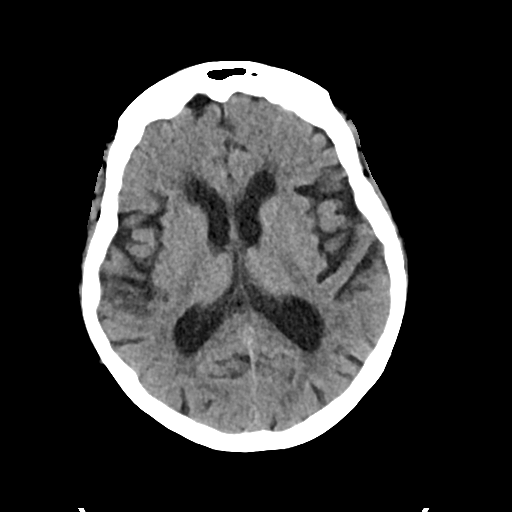
[im 20/32  brain]
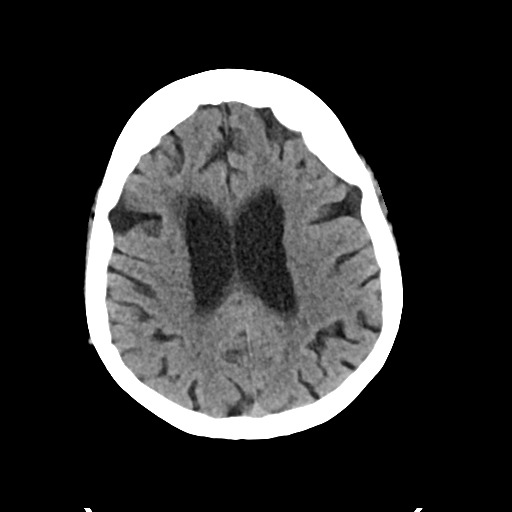
[im 20/32  bone]
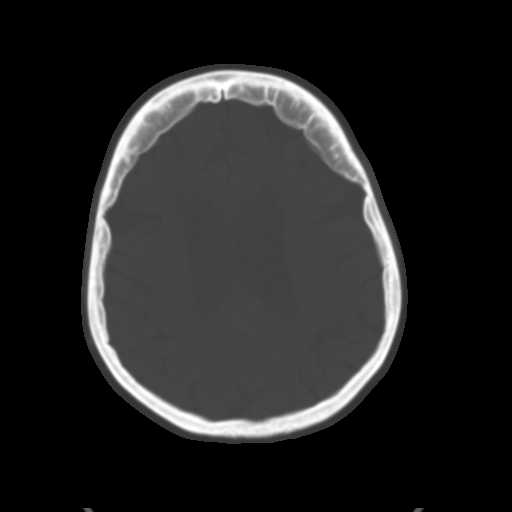
[im 24/32  brain]
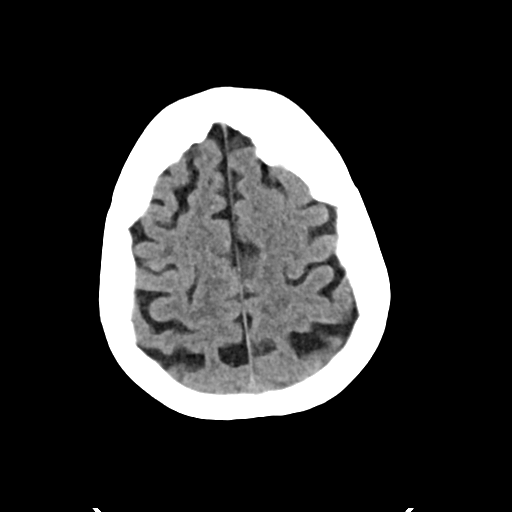
[im 28/32  brain]
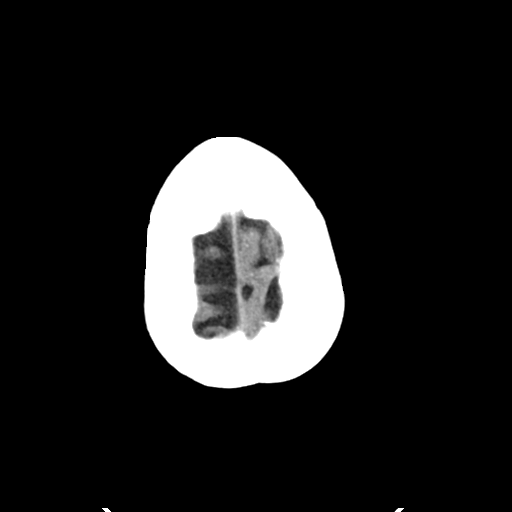

[Series 4: head bone · axial · 0.45mm/px · z∈[-179,-163]mm · 2 of 80 slices shown]
[im 8/80  bone]
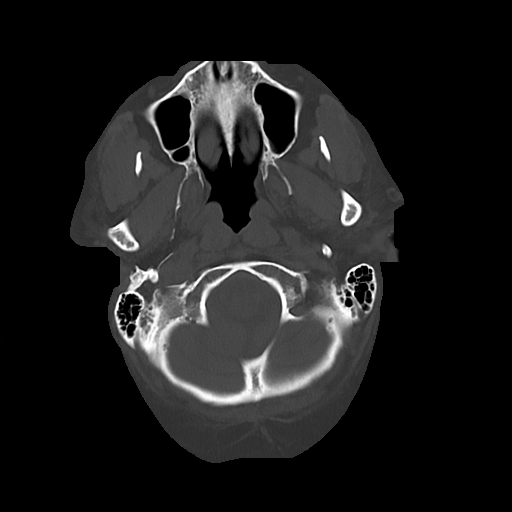
[im 16/80  bone]
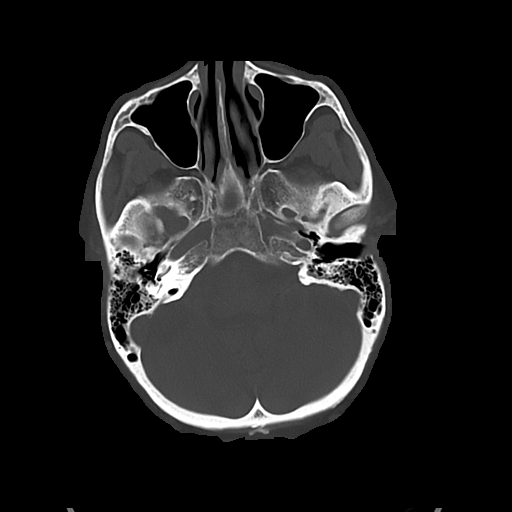

[Series 5: cor soft · coronal · 0.32mm/px · 3 of 75 slices shown]
[im 25/75  brain]
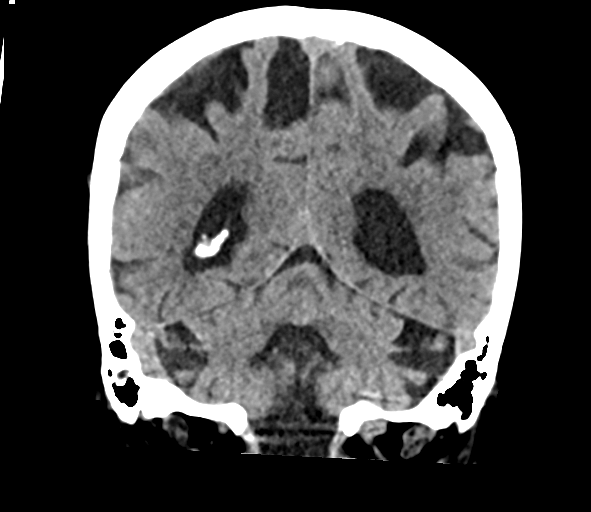
[im 33/75  brain]
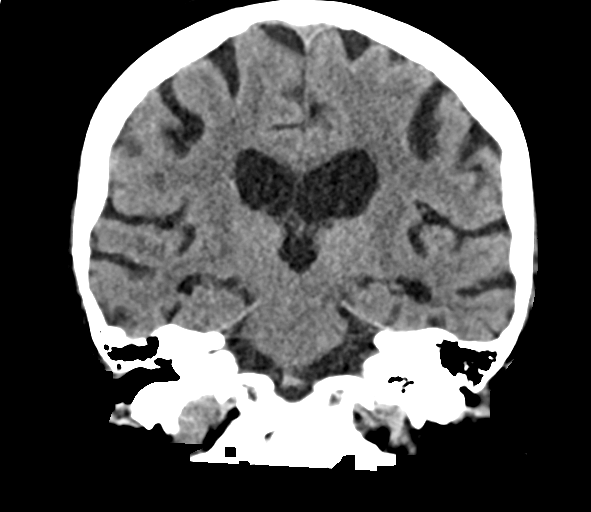
[im 42/75  brain]
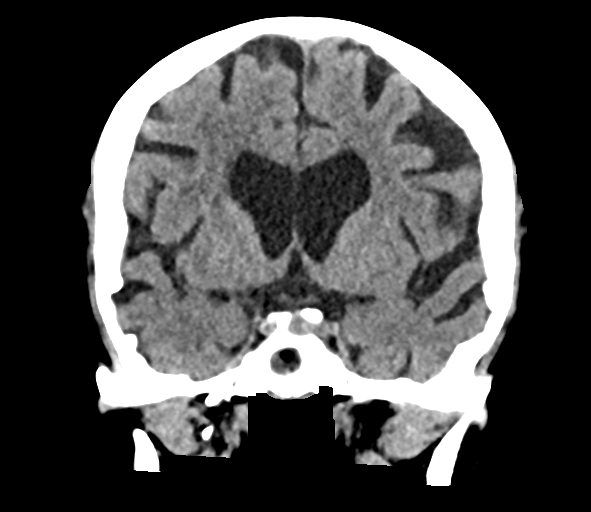

[Series 6: sag soft · sagittal · 0.32mm/px · 3 of 62 slices shown]
[im 21/62  brain]
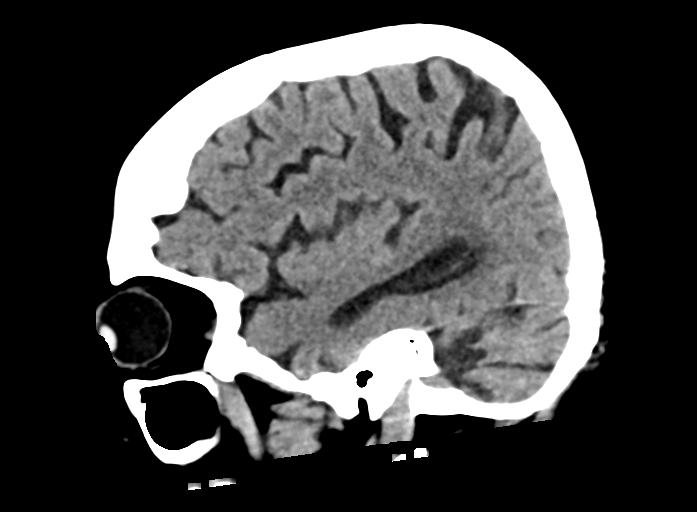
[im 31/62  brain]
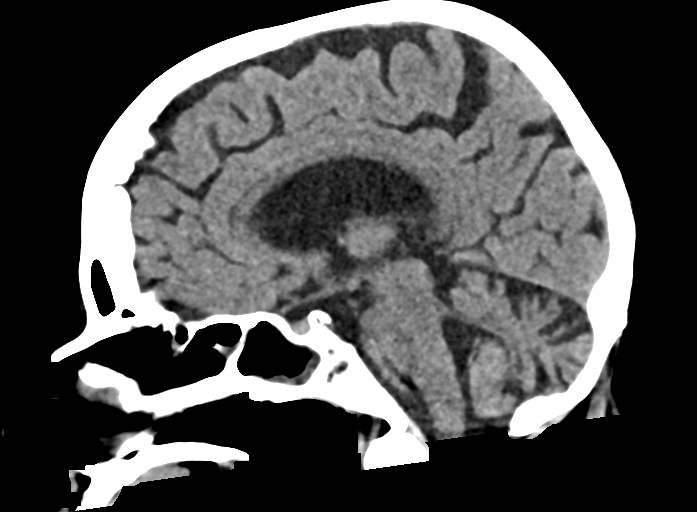
[im 41/62  brain]
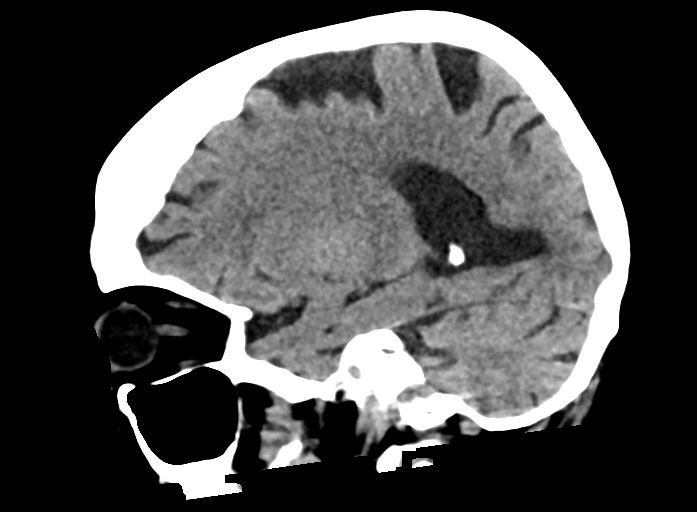

[15 of 47 positions shown; findings below may reference images not displayed]

FINDINGS: Brain: No evidence of acute territorial infarction, hemorrhage,
hydrocephalus,extra-axial collection or mass lesion/mass effect.
There is dilatation the ventricles and sulci consistent with
age-related atrophy. Low-attenuation changes in the deep white
matter consistent with small vessel ischemia.

Vascular: No hyperdense vessel or unexpected calcification.

Skull: The skull is intact. No fracture or focal lesion identified.

Sinuses/Orbits: The visualized paranasal sinuses and mastoid air
cells are clear. The orbits and globes intact.

Other: None

Cervical spine:

Alignment: Physiologic

Skull base and vertebrae: Visualized skull base is intact. No
atlanto-occipital dissociation. The vertebral body heights are well
maintained. No fracture or pathologic osseous lesion seen.

Soft tissues and spinal canal: The visualized paraspinal soft
tissues are unremarkable. No prevertebral soft tissue swelling is
seen. The spinal canal is grossly unremarkable, no large epidural
collection or significant canal narrowing.

Disc levels: Multilevel cervical spine spondylosis is seen with mild
disc height loss, anterior osteophytes and uncovertebral osteophytes
which is most notable at C5-C6 with moderate neural foraminal
narrowing and mild central canal stenosis.

Upper chest: The lung apices are clear. Thoracic inlet is within
normal limits.

Other: None
IMPRESSION: No acute intracranial abnormality.

Findings consistent with age related atrophy and chronic small
vessel ischemia

No acute fracture or malalignment of the spine.

Cervical spine spondylosis most notable at C5-C6.

## 2020-02-01 IMAGING — CT CT CERVICAL SPINE W/O CM
4 series · 14 of 35 positions shown, 16 images · non-contrast
Comparison: None.

CLINICAL DATA: Polytrauma

EXAM:
CT HEAD WITHOUT CONTRAST
TECHNIQUE: Contiguous axial images were obtained from the base of the skull
through the vertex without intravenous contrast.

[Series 4: c spine soft · axial · 0.37mm/px · z∈[-312,-284]mm · 2 of 96 slices shown]
[im 14/96  soft-tissue]
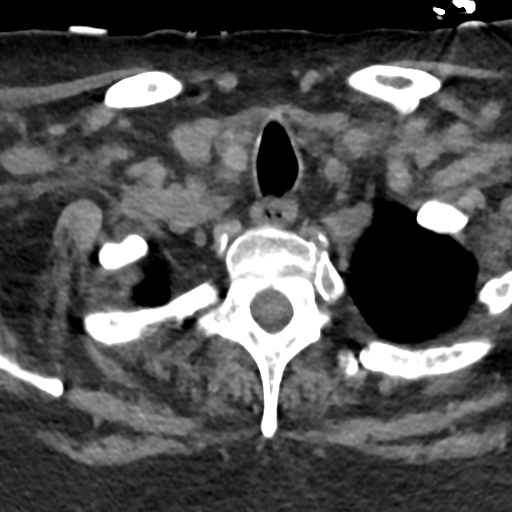
[im 28/96  soft-tissue]
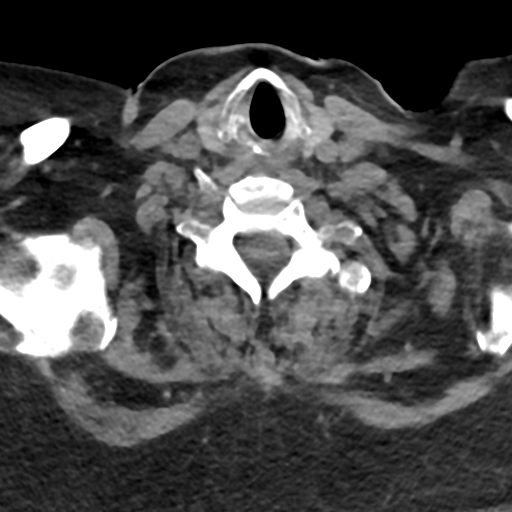

[Series 7: sag bone · sagittal · 0.32mm/px · 5 of 95 slices shown, 6 images]
[im 32/95  bone]
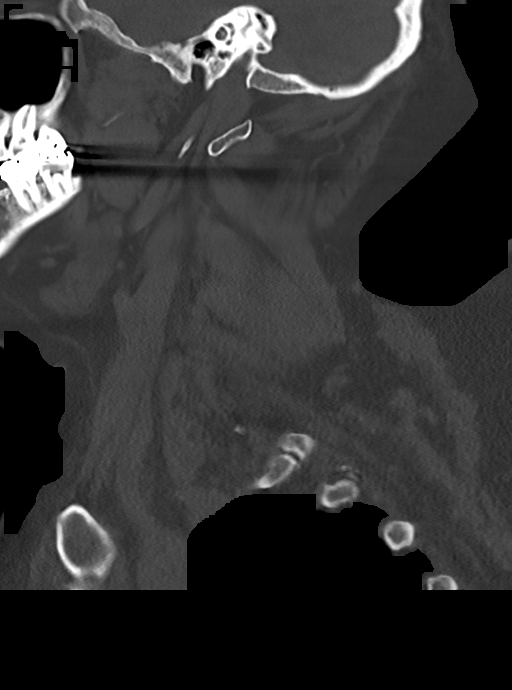
[im 40/95  bone]
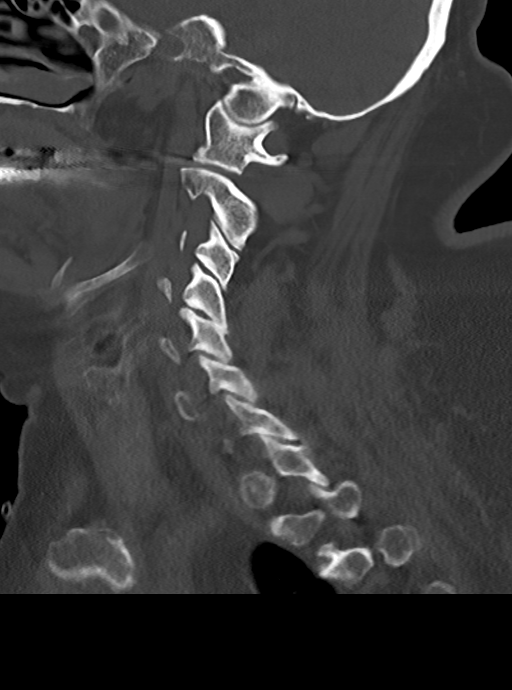
[im 48/95  soft-tissue]
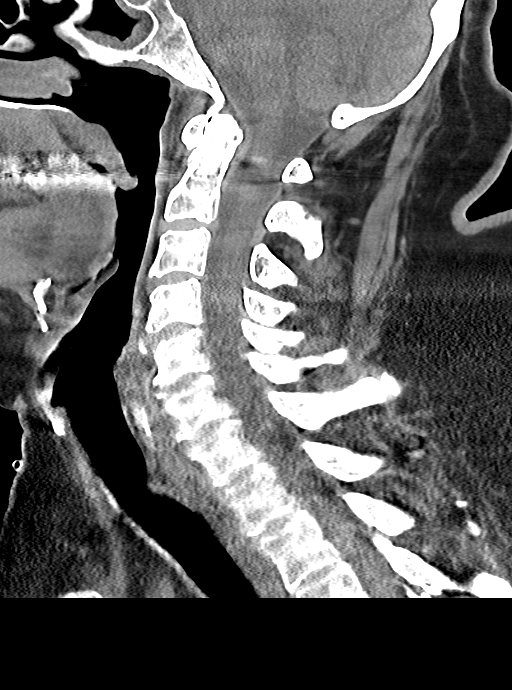
[im 48/95  bone]
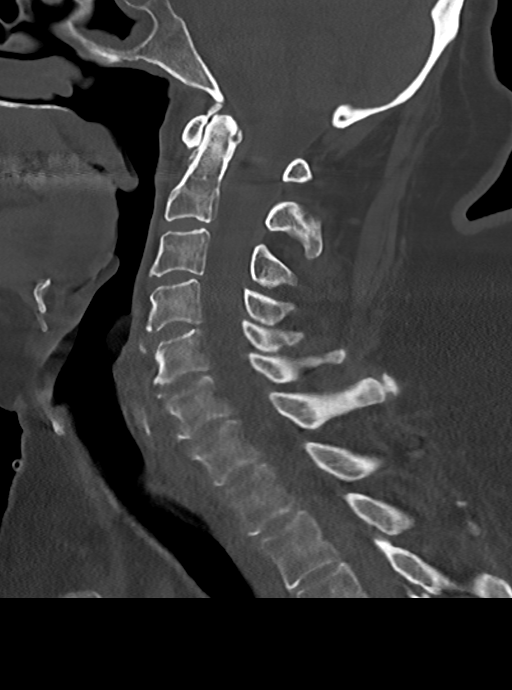
[im 55/95  bone]
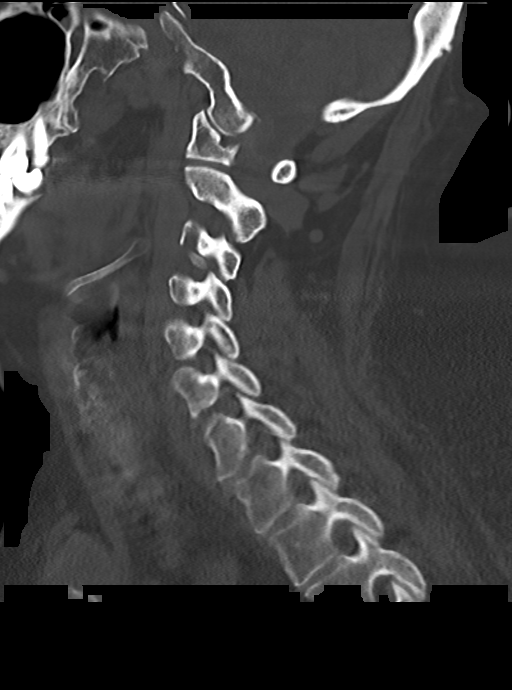
[im 63/95  bone]
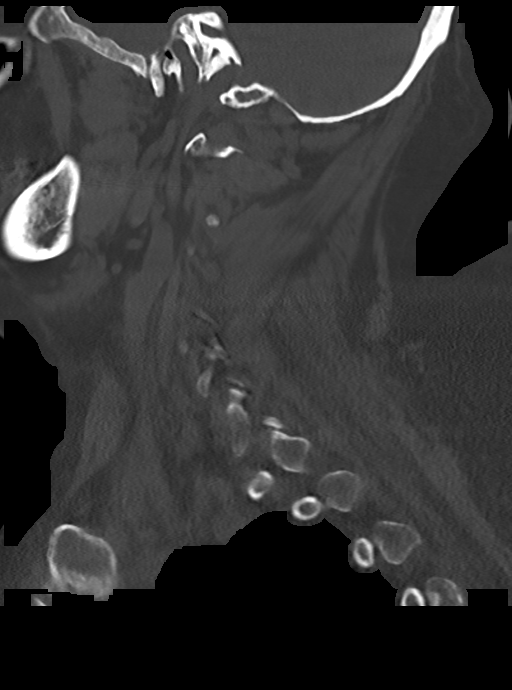

[Series 8: cor bone · coronal · 0.35mm/px · 3 of 66 slices shown]
[im 14/66  bone]
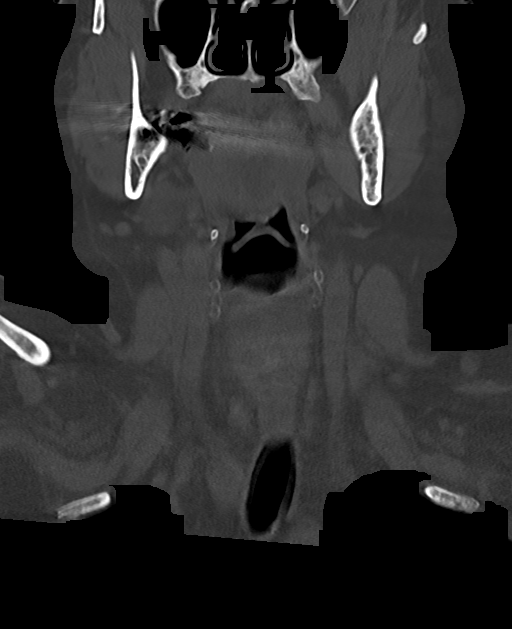
[im 27/66  bone]
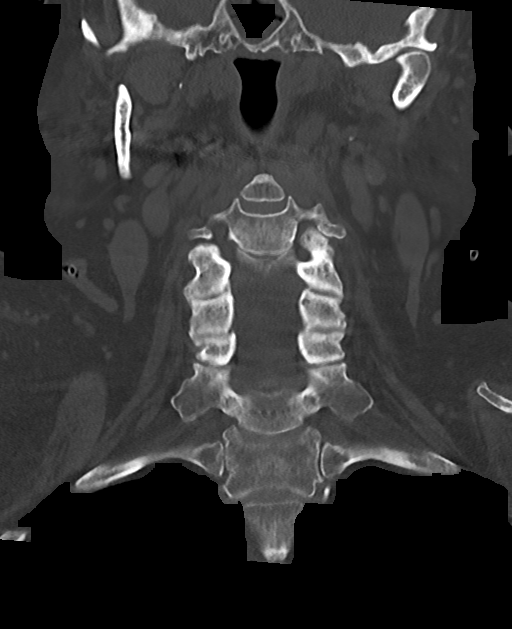
[im 40/66  bone]
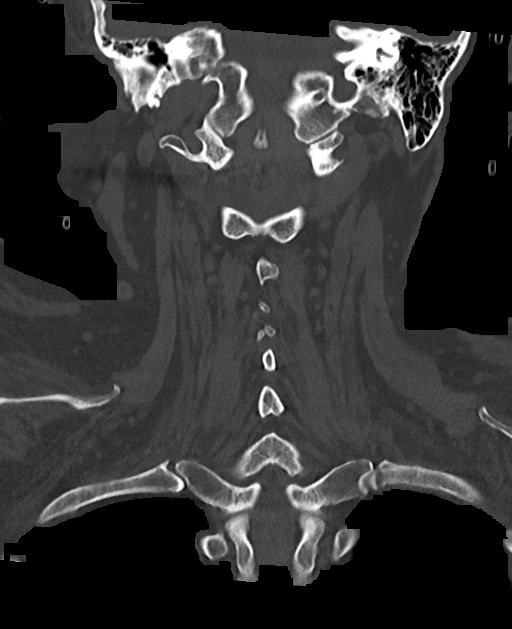

[Series 10: orthogonal axials · axial · 0.21mm/px · z∈[-308,-198]mm · 4 of 81 slices shown, 5 images]
[im 17/81  soft-tissue]
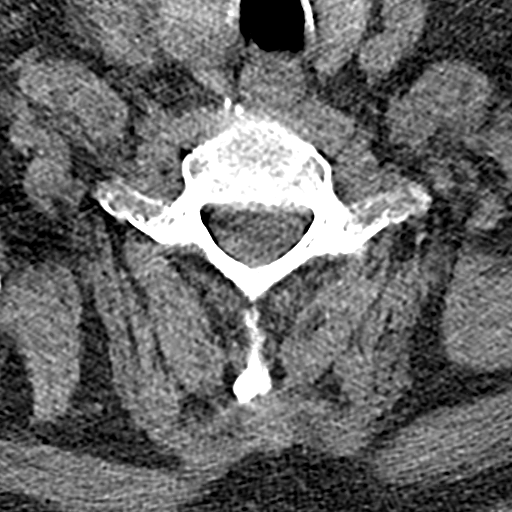
[im 17/81  bone]
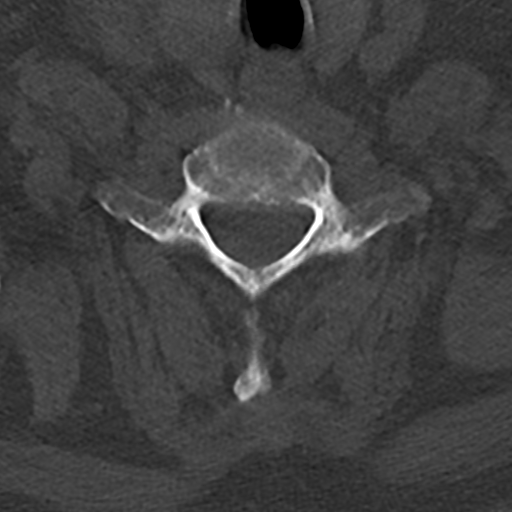
[im 33/81  bone]
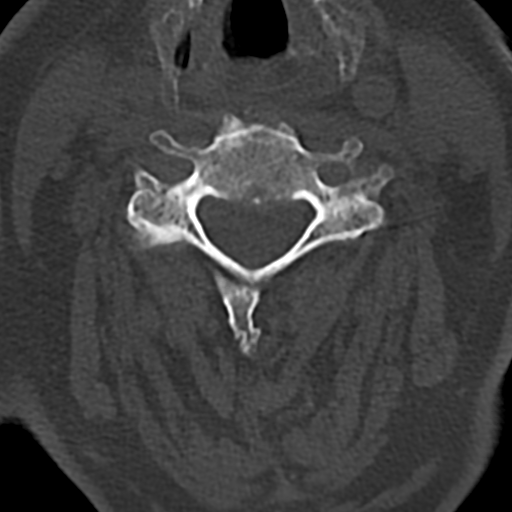
[im 49/81  bone]
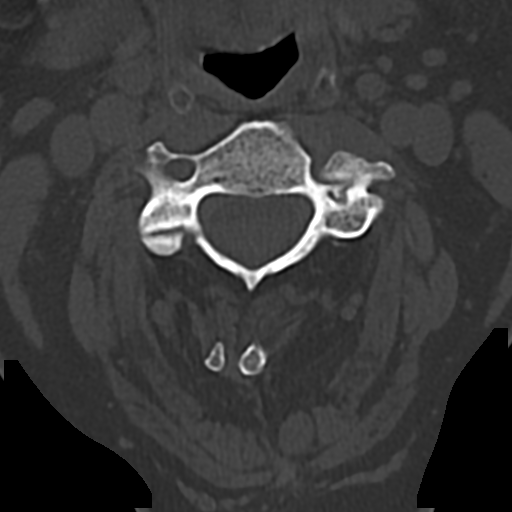
[im 65/81  bone]
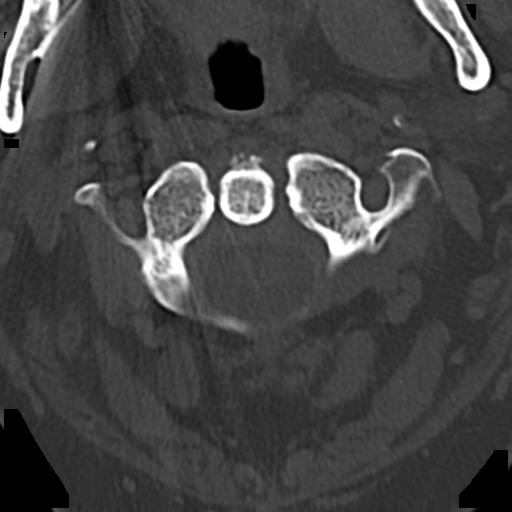

[14 of 35 positions shown; findings below may reference images not displayed]

FINDINGS: Brain: No evidence of acute territorial infarction, hemorrhage,
hydrocephalus,extra-axial collection or mass lesion/mass effect.
There is dilatation the ventricles and sulci consistent with
age-related atrophy. Low-attenuation changes in the deep white
matter consistent with small vessel ischemia.

Vascular: No hyperdense vessel or unexpected calcification.

Skull: The skull is intact. No fracture or focal lesion identified.

Sinuses/Orbits: The visualized paranasal sinuses and mastoid air
cells are clear. The orbits and globes intact.

Other: None

Cervical spine:

Alignment: Physiologic

Skull base and vertebrae: Visualized skull base is intact. No
atlanto-occipital dissociation. The vertebral body heights are well
maintained. No fracture or pathologic osseous lesion seen.

Soft tissues and spinal canal: The visualized paraspinal soft
tissues are unremarkable. No prevertebral soft tissue swelling is
seen. The spinal canal is grossly unremarkable, no large epidural
collection or significant canal narrowing.

Disc levels: Multilevel cervical spine spondylosis is seen with mild
disc height loss, anterior osteophytes and uncovertebral osteophytes
which is most notable at C5-C6 with moderate neural foraminal
narrowing and mild central canal stenosis.

Upper chest: The lung apices are clear. Thoracic inlet is within
normal limits.

Other: None
IMPRESSION: No acute intracranial abnormality.

Findings consistent with age related atrophy and chronic small
vessel ischemia

No acute fracture or malalignment of the spine.

Cervical spine spondylosis most notable at C5-C6.

## 2020-02-01 NOTE — ED Triage Notes (Signed)
Pt. Transported [facility] from [facility] c/o of falling out of bed, unwitnessed. Pt. Is on Eliquis and has skin tears to BLE Pt. Denies pain, no LOC.

## 2020-02-01 NOTE — ED Notes (Signed)
Called [facility] for transport back to [name]--Becky Hendricks 

## 2020-02-01 NOTE — ED Provider Notes (Addendum)
[facility] EMERGENCY DEPARTMENT Provider Note   CSN: [id number] Arrival date & time: 02/01/20  0031   History Chief Complaint  Patient presents with  . Fall    Becky Hendricks is a 74 y.o. female.  The history is provided by the patient.  Fall  She has history of hypertension, diabetes, atrial fibrillation anticoagulated on apixaban and comes in as a level 2 trauma following a fall out of bed.  She denies injury or loss of consciousness.  No past medical history on file.  There are no problems to display for this patient.   ** The histories are not reviewed yet. Please review them in the "History" navigator section and refresh this SmartLink.   OB History   No obstetric history on file.     No family history on file.  Social History   Tobacco Use  . Smoking status: Not on file  Substance Use Topics  . Alcohol use: Not on file  . Drug use: Not on file    Home Medications Prior to Admission medications   Not on File    Allergies    Patient has no allergy information on record.  Review of Systems   Review of Systems  All other systems reviewed and are negative.   Physical Exam Updated Vital Signs BP 130/60 (BP Location: Left Arm)   Pulse 79   Temp 98.1 F (36.7 C) (Oral)   Resp 18   SpO2 99%   Physical Exam Vitals and nursing note reviewed.   74 year old female, resting comfortably and in no acute distress. Vital signs are normal. Oxygen saturation is 99%, which is normal. Head is normocephalic and atraumatic. PERRLA, EOMI. Oropharynx is clear. Neck is nontender without adenopathy or JVD. Back is nontender and there is no CVA tenderness. Lungs are clear without rales, wheezes, or rhonchi. Chest is nontender. Heart has regular rate and rhythm without murmur. Abdomen is soft, flat, nontender without masses or hepatosplenomegaly and peristalsis is normoactive. Extremities have trace edema, full range of motion is present.  Minor  skin tears are noted on both legs. Skin is warm and dry without rash. Neurologic: Mental status is normal, cranial nerves are intact, there are no motor or sensory deficits.  ED Results / Procedures / Treatments   Results for orders placed or performed during the hospital encounter of 02/01/20  Comprehensive metabolic panel  Result Value Ref Range   Sodium 141 135 - 145 mmol/L   Potassium 3.6 3.5 - 5.1 mmol/L   Chloride 96 (L) 98 - 111 mmol/L   CO2 34 (H) 22 - 32 mmol/L   Glucose, Bld 191 (H) 70 - 99 mg/dL   BUN 12 8 - 23 mg/dL   Creatinine, Ser 3.71 0.44 - 1.00 mg/dL   Calcium 8.6 (L) 8.9 - 10.3 mg/dL   Total Protein 5.8 (L) 6.5 - 8.1 g/dL   Albumin 2.6 (L) 3.5 - 5.0 g/dL   AST 15 15 - 41 U/L   ALT 19 0 - 44 U/L   Alkaline Phosphatase 51 38 - 126 U/L   Total Bilirubin 0.5 0.3 - 1.2 mg/dL   GFR calc non Af Amer >60 >60 mL/min   GFR calc Af Amer >60 >60 mL/min   Anion gap 11 5 - 15  CBC  Result Value Ref Range   WBC 8.4 4.0 - 10.5 K/uL   RBC 3.72 (L) 3.87 - 5.11 MIL/uL   Hemoglobin 10.7 (L) 12.0 - 15.0 g/dL  HCT 36.2 36 - 46 %   MCV 97.3 80.0 - 100.0 fL   MCH 28.8 26.0 - 34.0 pg   MCHC 29.6 (L) 30.0 - 36.0 g/dL   RDW 16.8 (H) 11.5 - 15.5 %   Platelets 232 150 - 400 K/uL   nRBC 0.0 0.0 - 0.2 %  Ethanol  Result Value Ref Range   Alcohol, Ethyl (B) <10 <10 mg/dL  Lactic acid, plasma  Result Value Ref Range   Lactic Acid, Venous 2.0 (HH) 0.5 - 1.9 mmol/L  Protime-INR  Result Value Ref Range   Prothrombin Time 13.3 11.4 - 15.2 seconds   INR 1.1 0.8 - 1.2  I-Stat Chem 8, ED  Result Value Ref Range   Sodium 141 135 - 145 mmol/L   Potassium 3.6 3.5 - 5.1 mmol/L   Chloride 93 (L) 98 - 111 mmol/L   BUN 13 8 - 23 mg/dL   Creatinine, Ser 0.60 0.44 - 1.00 mg/dL   Glucose, Bld 190 (H) 70 - 99 mg/dL   Calcium, Ion 1.22 1.15 - 1.40 mmol/L   TCO2 38 (H) 22 - 32 mmol/L   Hemoglobin 10.9 (L) 12.0 - 15.0 g/dL   HCT 32.0 (L) 36 - 46 %    EKG EKG  Interpretation  Date/Time:  Monday February 01 2020 00:44:36 EDT Ventricular Rate:  65 PR Interval:    QRS Duration: 103 QT Interval:  439 QTC Calculation: 457 R Axis:   58 Text Interpretation: Sinus rhythm Nonspecific T abnormalities, lateral leads No old tracing to compare Confirmed by Delora Fuel ([id number]) on 02/01/2020 12:48:43 AM   Radiology CT Head Wo Contrast  Result Date: 02/01/2020 CLINICAL DATA:  Polytrauma EXAM: CT HEAD WITHOUT CONTRAST TECHNIQUE: Contiguous axial images were obtained from the base of the skull through the vertex without intravenous contrast. COMPARISON:  None. FINDINGS: Brain: No evidence of acute territorial infarction, hemorrhage, hydrocephalus,extra-axial collection or mass lesion/mass effect. There is dilatation the ventricles and sulci consistent with age-related atrophy. Low-attenuation changes in the deep white matter consistent with small vessel ischemia. Vascular: No hyperdense vessel or unexpected calcification. Skull: The skull is intact. No fracture or focal lesion identified. Sinuses/Orbits: The visualized paranasal sinuses and mastoid air cells are clear. The orbits and globes intact. Other: None Cervical spine: Alignment: Physiologic Skull base and vertebrae: Visualized skull base is intact. No atlanto-occipital dissociation. The vertebral body heights are well maintained. No fracture or pathologic osseous lesion seen. Soft tissues and spinal canal: The visualized paraspinal soft tissues are unremarkable. No prevertebral soft tissue swelling is seen. The spinal canal is grossly unremarkable, no large epidural collection or significant canal narrowing. Disc levels: Multilevel cervical spine spondylosis is seen with mild disc height loss, anterior osteophytes and uncovertebral osteophytes which is most notable at C5-C6 with moderate neural foraminal narrowing and mild central canal stenosis. Upper chest: The lung apices are clear. Thoracic inlet is within normal  limits. Other: None IMPRESSION: No acute intracranial abnormality. Findings consistent with age related atrophy and chronic small vessel ischemia No acute fracture or malalignment of the spine. 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 Electronically Signed   By: Prudencio Pair M.D.   On: 02/01/2020 01:36   CT Cervical Spine Wo Contrast  Result Date: 02/01/2020 CLINICAL DATA:  Polytrauma EXAM: CT HEAD WITHOUT CONTRAST TECHNIQUE: Contiguous axial images were obtained from the base of the skull through the vertex without intravenous contrast. COMPARISON:  None. FINDINGS: Brain: No evidence of acute territorial infarction, hemorrhage, hydrocephalus,extra-axial collection  or mass lesion/mass effect. There is dilatation the ventricles and sulci consistent with age-related atrophy. Low-attenuation changes in the deep white matter consistent with small vessel ischemia. Vascular: No hyperdense vessel or unexpected calcification. Skull: The skull is intact. No fracture or focal lesion identified. Sinuses/Orbits: The visualized paranasal sinuses and mastoid air cells are clear. The orbits and globes intact. Other: None Cervical spine: Alignment: Physiologic Skull base and vertebrae: Visualized skull base is intact. No atlanto-occipital dissociation. The vertebral body heights are well maintained. No fracture or pathologic osseous lesion seen. Soft tissues and spinal canal: The visualized paraspinal soft tissues are unremarkable. No prevertebral soft tissue swelling is seen. The spinal canal is grossly unremarkable, no large epidural collection or significant canal narrowing. Disc levels: Multilevel cervical spine spondylosis is seen with mild disc height loss, anterior osteophytes and uncovertebral osteophytes which is most notable at C5-C6 with moderate neural foraminal narrowing and mild central canal stenosis. Upper chest: The lung apices are clear. Thoracic inlet is within normal limits. Other: None IMPRESSION:  No acute intracranial abnormality. Findings consistent with age related atrophy and chronic small vessel ischemia No acute fracture or malalignment of the spine. 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 Electronically Signed   By: Jonna Clark M.D.   On: 02/01/2020 01:36    Procedures Procedures  CRITICAL CARE Performed by: Dione Booze Total critical care time: 35 minutes Critical care time was exclusive of separately billable procedures and treating other patients. Critical care was necessary to treat or prevent imminent or life-threatening deterioration. Critical care was time spent personally by me on the following activities: development of treatment plan with patient and/or surrogate as well as nursing, discussions with consultants, evaluation of patient's response to treatment, examination of patient, obtaining history from patient or surrogate, ordering and performing treatments and interventions, ordering and review of laboratory studies, ordering and review of radiographic studies, pulse oximetry and re-evaluation of patient's condition.  Medications Ordered in ED Medications - No data to display  ED Course  I have reviewed the triage vital signs and the nursing notes.  Pertinent imaging results that were available during my care of the patient were reviewed by me and considered in my medical decision making (see chart for details).  MDM Rules/Calculators/A&P Unwitnessed fall inpatient anticoagulated on apixaban.  No obvious injury other than skin tears.  She will be sent for CT of head and cervical spine.  Old records are reviewed confirming longstanding atrial fibrillation with anticoagulation with apixaban.  CT scans show no acute injury.  Mild anemia is noted and unchanged from baseline.  She is returned to her skilled nursing facility.  Final Clinical Impression(s) / ED Diagnoses Final diagnoses:  Fall from bed, initial encounter  Normochromic normocytic anemia   Chronic anticoagulation    Rx / DC Orders ED Discharge Orders    None       Dione Booze, MD 02/01/20 0159  CT obtained because of unwitnessed fall in the patient on anticoagulants.  Risk of intracranial hemorrhage in this situation is high and warrants CT scan.   Dione Booze, MD 03/14/20 [id number]    Dione Booze, MD 03/23/20 [phone]

## 2020-02-11 ENCOUNTER — Ambulatory Visit (INDEPENDENT_AMBULATORY_CARE_PROVIDER_SITE_OTHER): Payer: Medicare Other | Admitting: Infectious Disease

## 2020-02-11 ENCOUNTER — Other Ambulatory Visit: Payer: Self-pay

## 2020-02-11 ENCOUNTER — Encounter: Payer: Self-pay | Admitting: Infectious Disease

## 2020-02-11 VITALS — BP 119/55 | HR 71 | Temp 97.6°F

## 2020-02-11 DIAGNOSIS — L89304 Pressure ulcer of unspecified buttock, stage 4: Secondary | ICD-10-CM

## 2020-02-11 DIAGNOSIS — S31000D Unspecified open wound of lower back and pelvis without penetration into retroperitoneum, subsequent encounter: Secondary | ICD-10-CM

## 2020-02-11 DIAGNOSIS — J80 Acute respiratory distress syndrome: Secondary | ICD-10-CM

## 2020-02-11 DIAGNOSIS — J1282 Pneumonia due to coronavirus disease 2019: Secondary | ICD-10-CM

## 2020-02-11 DIAGNOSIS — J9621 Acute and chronic respiratory failure with hypoxia: Secondary | ICD-10-CM | POA: Diagnosis not present

## 2020-02-11 DIAGNOSIS — M86159 Other acute osteomyelitis, unspecified femur: Secondary | ICD-10-CM

## 2020-02-11 DIAGNOSIS — U071 COVID-19: Secondary | ICD-10-CM | POA: Diagnosis not present

## 2020-02-11 DIAGNOSIS — W19XXXA Unspecified fall, initial encounter: Secondary | ICD-10-CM

## 2020-02-11 DIAGNOSIS — I480 Paroxysmal atrial fibrillation: Secondary | ICD-10-CM

## 2020-02-11 HISTORY — DX: Unspecified fall, initial encounter: W19.XXXA

## 2020-02-11 NOTE — Progress Notes (Signed)
Subjective:    Patient ID: Becky Hendricks, female    DOB: [birth year], 74 y.o.   MRN: [id number]  HPI   74 y.o. 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 She has undergone debridements  with general surgery a Polymicrobial with a fairly unfriendly Staphylococcus warneri  And PREVOTELLA DENTICOLA BACTEROIDES THETAIOTAOMICRON beta lactamase + from cultures in April.SHe had grown Strep hemolyticus and Staph Haemolyticus from bone culture in March  We treated her with IV vancomycin and unasyn into April. Along the way she had a drug rash to azetazolamide and was seen by Dr. Baxter Flattery from our group.  The patient had a cardiac arrest requiring hospitalization and has had recent fall from bed at the end of June.  She is accompanied by her brother today  She seems to be doing well off of the antibiotics and is getting WOC in the SNF.  Past Medical History:  Diagnosis Date  . Atrial fibrillation ([place])   . Bipolar disorder ([place])   . Clostridium difficile diarrhea 08/02/2019  . Dehydration   . Essential hypertension   . Morbid obesity ([place])   . Type 2 diabetes mellitus ([place])   . Weakness     Past Surgical History:  Procedure Laterality Date  . INCISION AND DRAINAGE PERIRECTAL ABSCESS Left 12/04/2019   Procedure: IRRIGATION AND DEBRIDEMENT LEFT BUTTOCK ABSCESS;  Surgeon: Georganna Skeans, MD;  Location: [facility];  Service: General;  Laterality: Left;  . INCISION AND DRAINAGE PERIRECTAL ABSCESS Left 12/10/2019   Procedure: REPEAT IRRIGATION AND DEBRIDEMENT BUTTOCK  ABSCESS;  Surgeon: Coralie Keens, MD;  Location: [facility];  Service: General;  Laterality: Left;    Family History  Family history unknown: Yes      Social History   Socioeconomic History  . Marital status: Single    Spouse name: Not on file  . Number of children: Not on file  . Years of education: Not on file  . Highest education level: Not on file  Occupational  History  . Not on file  Tobacco Use  . Smoking status: Never Smoker  . Smokeless tobacco: Never Used  Vaping Use  . Vaping Use: Unknown  Substance and Sexual Activity  . Alcohol use: Not Currently  . Drug use: Not Currently  . Sexual activity: Not Currently    Birth control/protection: Post-menopausal  Other Topics Concern  . Not on file  Social History Narrative  . Not on file   Social Determinants of Health   Financial Resource Strain:   . Difficulty of Paying Living Expenses:   Food Insecurity:   . Worried About Charity fundraiser in the Last Year:   . Arboriculturist in the Last Year:   Transportation Needs:   . Film/video editor (Medical):   Marland Kitchen Lack of Transportation (Non-Medical):   Physical Activity:   . Days of Exercise per Week:   . Minutes of Exercise per Session:   Stress:   . Feeling of Stress :   Social Connections:   . Frequency of Communication with Friends and Family:   . Frequency of Social Gatherings with Friends and Family:   . Attends Religious Services:   . Active Member of Clubs or Organizations:   . Attends Archivist Meetings:   Marland Kitchen Marital Status:     Allergies  Allergen Reactions  . Chlorhexidine Gluconate Itching  . Acetazolamide Er Rash     Current Outpatient Medications:  .  acetaminophen (TYLENOL) 325 MG tablet, Take 650 mg by mouth every 6 (six) hours as needed for mild pain or headache., Disp: , Rfl:  .  acetaminophen (TYLENOL) 325 MG tablet, Take 650 mg by mouth every 6 (six) hours as needed for mild pain or headache., Disp: , Rfl:  .  Amino Acids-Protein Hydrolys (FEEDING SUPPLEMENT, PRO-STAT SUGAR FREE 64,) LIQD, Take 30 mLs by mouth 2 (two) times daily., Disp: 887 mL, Rfl: 0 .  Amino Acids-Protein Hydrolys (FEEDING SUPPLEMENT, PRO-STAT SUGAR FREE 64,) LIQD, Take 30 mLs by mouth in the morning and at bedtime., Disp: , Rfl:  .  apixaban (ELIQUIS) 5 MG TABS tablet, Take 5 mg by mouth 2 (two) times daily. , Disp: , Rfl:   .  apixaban (ELIQUIS) 5 MG TABS tablet, Take 5 mg by mouth 2 (two) times daily., Disp: , Rfl:  .  ascorbic acid (VITAMIN C) 1000 MG tablet, Take 500 mg by mouth 2 (two) times daily., Disp: , Rfl:  .  ascorbic acid (VITAMIN C) 500 MG tablet, Take 1,000 mg by mouth 2 (two) times daily., Disp: , Rfl:  .  atorvastatin (LIPITOR) 10 MG tablet, Take 10 mg by mouth at bedtime. , Disp: , Rfl:  .  atorvastatin (LIPITOR) 10 MG tablet, Take 10 mg by mouth at bedtime., Disp: , Rfl:  .  calcium carbonate (TUMS - DOSED IN MG ELEMENTAL CALCIUM) 500 MG chewable tablet, Chew 1 tablet by mouth daily., Disp: , Rfl:  .  calcium carbonate (TUMS - DOSED IN MG ELEMENTAL CALCIUM) 500 MG chewable tablet, Chew 1 tablet by mouth daily., Disp: , Rfl:  .  carvedilol (COREG) 12.5 MG tablet, Take 1 tablet (12.5 mg total) by mouth 2 (two) times daily with a meal., Disp: , Rfl:  .  carvedilol (COREG) 25 MG tablet, Take 12.5 mg by mouth 2 (two) times daily with a meal., Disp: , Rfl:  .  Cholecalciferol (VITAMIN D) 125 MCG (5000 UT) CAPS, Take 10,000 Units by mouth once a week. Wednesday, Disp: , Rfl:  .  diphenhydrAMINE (BENADRYL) 2 % cream, Apply 1 application topically 3 (three) times daily as needed for itching., Disp: , Rfl:  .  diphenhydrAMINE-zinc acetate (BENADRYL) cream, Apply 1 application topically 3 (three) times daily as needed for itching., Disp: 28.4 g, Rfl: 0 .  divalproex (DEPAKOTE) 500 MG DR tablet, Take 1 tablet (500 mg total) by mouth 2 (two) times daily., Disp: 60 tablet, Rfl: 0 .  divalproex (DEPAKOTE) 500 MG DR tablet, Take 500 mg by mouth 2 (two) times daily., Disp: , Rfl:  .  docusate sodium (COLACE) 100 MG capsule, Take 1 capsule (100 mg total) by mouth 2 (two) times daily as needed for mild constipation., Disp: 10 capsule, Rfl: 0 .  docusate sodium (COLACE) 100 MG capsule, Take 100 mg by mouth 2 (two) times daily as needed for mild constipation., Disp: , Rfl:  .  Ensure Max Protein (ENSURE MAX PROTEIN)  LIQD, Take 330 mLs (11 oz total) by mouth 2 (two) times daily., Disp: , Rfl:  .  famotidine (PEPCID) 20 MG tablet, Take 20 mg by mouth 2 (two) times daily., Disp: , Rfl:  .  fluconazole (DIFLUCAN) 150 MG tablet, Take 150 mg by mouth once., Disp: , Rfl:  .  furosemide (LASIX) 40 MG tablet, Take 1 tablet (40 mg total) by mouth daily., Disp: 30 tablet, Rfl: 0 .  furosemide (LASIX) 40 MG tablet, Take 40 mg by mouth., Disp: , Rfl:  .  hydrALAZINE (APRESOLINE) 25 MG tablet, Take 1 tablet (25 mg total) by mouth every 8 (eight) hours., Disp: , Rfl:  .  hydrALAZINE (APRESOLINE) 25 MG tablet, Take 25 mg by mouth every 8 (eight) hours., Disp: , Rfl:  .  hydrocortisone cream 1 %, Apply topically 2 (two) times daily., Disp: 30 g, Rfl: 0 .  hydrocortisone cream 1 %, Apply 1 application topically 2 (two) times daily., Disp: , Rfl:  .  insulin glargine (LANTUS) 100 UNIT/ML injection, Inject 0.15 mLs (15 Units total) into the skin at bedtime., Disp: , Rfl:  .  insulin glargine (LANTUS) 100 UNIT/ML injection, Inject 12 Units into the skin at bedtime., Disp: , Rfl:  .  insulin lispro (HUMALOG) 100 UNIT/ML cartridge, Inject into the skin See admin instructions. Inject into the skin 2 times a day between meals, per sliding scale: BGL 0-200 = give nothing; 201-250 = 2 units; 251-300 = 4 units; 301-350 = 6 units; 351-400 = 8 units; 401-450 = 10 units; 451-500 = 12 units; >450 = 12 units and CALL PROVIDER IF STILL ELEVATED AFTER 2 HOURS, Disp: , Rfl:  .  insulin lispro (HUMALOG) 100 UNIT/ML injection, Inject 0-12 Units into the skin 3 (three) times daily before meals. Sliding scale. BG 201-250: 2 units, 251-300:4 units, 301-350: 6 units, 351-400: 8 units, 401-450: 10 units, 451-500: 12 units. If BG is greater than 450, repeat BG in 2 hours after 12 units. If it is still elevated, notify provider., Disp: , Rfl:  .  Ipratropium-Albuterol (COMBIVENT RESPIMAT) 20-100 MCG/ACT AERS respimat, Inhale 1 puff into the lungs every 6  (six) hours., Disp: , Rfl:  .  Ipratropium-Albuterol (COMBIVENT) 20-100 MCG/ACT AERS respimat, Inhale 1 puff into the lungs every 6 (six) hours. (Patient taking differently: Inhale 1 puff into the lungs every 6 (six) hours as needed ("for COPD"). ), Disp:  , Rfl:  .  JANUMET 50-1000 MG tablet, Take 1 tablet by mouth 2 (two) times daily., Disp: , Rfl:  .  losartan (COZAAR) 25 MG tablet, Take 50 mg by mouth daily., Disp: , Rfl:  .  losartan (COZAAR) 50 MG tablet, Take 1 tablet (50 mg total) by mouth daily., Disp: , Rfl:  .  Multiple Vitamin (MULTIVITAMIN WITH MINERALS) TABS tablet, Take 1 tablet by mouth daily., Disp: , Rfl:  .  Multiple Vitamin (MULTIVITAMIN) tablet, Take 1 tablet by mouth daily., Disp: , Rfl:  .  nutrition supplement, JUVEN, (JUVEN) PACK, Take 1 packet by mouth 2 (two) times daily between meals., Disp: , Rfl: 0 .  nutrition supplement, JUVEN, (JUVEN) PACK, Take 1 packet by mouth 2 (two) times daily between meals., Disp: , Rfl:  .  pantoprazole (PROTONIX) 40 MG tablet, Take 1 tablet (40 mg total) by mouth 2 (two) times daily., Disp: 30 tablet, Rfl: 0 .  pantoprazole (PROTONIX) 40 MG tablet, Take 40 mg by mouth daily., Disp: , Rfl:  .  sennosides-docusate sodium (SENOKOT-S) 8.6-50 MG tablet, Take 1 tablet by mouth daily., Disp: , Rfl:  .  sennosides-docusate sodium (SENOKOT-S) 8.6-50 MG tablet, Take 1 tablet by mouth daily., Disp: , Rfl:  .  sitaGLIPtin-metformin (JANUMET) 50-1000 MG tablet, Take 1 tablet by mouth 2 (two) times daily with a meal., Disp: , Rfl:  .  Zinc Gluconate 100 MG TABS, Take 220 mg by mouth daily., Disp: , Rfl:  .  zinc sulfate 220 (50 Zn) MG capsule, Take 220 mg by mouth daily., Disp: , Rfl:    Review of Systems  Unable to perform ROS: Dementia       Objective:   Physical Exam Constitutional:      General: She is not in acute distress.    Appearance: Normal appearance. She is well-developed. She is not ill-appearing or diaphoretic.  HENT:     Head:  Normocephalic and atraumatic.     Right Ear: Hearing and external ear normal.     Left Ear: Hearing and external ear normal.     Nose: No nasal deformity or rhinorrhea.  Eyes:     General: No scleral icterus.    Conjunctiva/sclera: Conjunctivae normal.     Right eye: Right conjunctiva is not injected.     Left eye: Left conjunctiva is not injected.     Pupils: Pupils are equal, round, and reactive to light.  Neck:     Vascular: No JVD.  Cardiovascular:     Rate and Rhythm: Normal rate and regular rhythm.     Heart sounds: S1 normal and S2 normal.  Pulmonary:     Effort: Pulmonary effort is normal. No respiratory distress.     Breath sounds: No wheezing.  Abdominal:     General: Bowel sounds are normal. There is no distension.     Palpations: Abdomen is soft.     Tenderness: There is no abdominal tenderness.  Musculoskeletal:        General: Normal range of motion.     Right shoulder: Normal.     Left shoulder: Normal.     Cervical back: Normal range of motion and neck supple.     Right hip: Normal.     Left hip: Normal.     Right knee: Normal.     Left knee: Normal.  Lymphadenopathy:     Head:     Right side of head: No submandibular, preauricular or posterior auricular adenopathy.     Left side of head: No submandibular, preauricular or posterior auricular adenopathy.     Cervical: No cervical adenopathy.     Right cervical: No superficial or deep cervical adenopathy.    Left cervical: No superficial or deep cervical adenopathy.  Skin:    General: Skin is warm and dry.     Coloration: Skin is not pale.     Findings: No abrasion, bruising, ecchymosis, erythema, lesion or rash.     Nails: There is no clubbing.  Neurological:     Mental Status: She is alert.  Psychiatric:        Attention and Perception: She is attentive.        Mood and Affect: Mood normal.        Speech: Speech normal.        Behavior: Behavior is cooperative.        Thought Content: Thought content  normal.    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 Is her underlying Neurological reason she remains bedbound chronic deconditoning  Hx of CDI: will dc florastor as no data to support it in preventing CDI and can cause fungemia  I spent greater than 25 minutes with the patient including greater than 50% of time in face to face  counsel of the patient and her brother re nature of these wounds and how we treat them and her treatment course and in coordination of their care.

## 2020-02-12 LAB — C-REACTIVE PROTEIN: CRP: 23.2 mg/L — ABNORMAL HIGH (ref <8.0)

## 2020-02-12 LAB — SED RATE MANUAL WEST RFLX: SED RATE BY MODIFIED WESTERGREN,MANUAL: 60 mm/h — ABNORMAL HIGH (ref 0–30)

## 2020-02-12 LAB — SEDIMENTATION RATE

## 2020-04-13 ENCOUNTER — Ambulatory Visit: Payer: Medicare Other | Admitting: Infectious Disease

## 2020-04-27 ENCOUNTER — Ambulatory Visit (INDEPENDENT_AMBULATORY_CARE_PROVIDER_SITE_OTHER): Payer: Medicare Other | Admitting: Infectious Disease

## 2020-04-27 ENCOUNTER — Other Ambulatory Visit: Payer: Self-pay

## 2020-04-27 ENCOUNTER — Encounter: Payer: Self-pay | Admitting: Infectious Disease

## 2020-04-27 VITALS — BP 115/80 | HR 66 | Resp 16 | Ht 66.0 in | Wt 204.0 lb

## 2020-04-27 DIAGNOSIS — S31000D Unspecified open wound of lower back and pelvis without penetration into retroperitoneum, subsequent encounter: Secondary | ICD-10-CM | POA: Diagnosis not present

## 2020-04-27 DIAGNOSIS — F3160 Bipolar disorder, current episode mixed, unspecified: Secondary | ICD-10-CM

## 2020-04-27 DIAGNOSIS — F319 Bipolar disorder, unspecified: Secondary | ICD-10-CM

## 2020-04-27 DIAGNOSIS — M869 Osteomyelitis, unspecified: Secondary | ICD-10-CM | POA: Diagnosis not present

## 2020-04-27 DIAGNOSIS — J1282 Pneumonia due to coronavirus disease 2019: Secondary | ICD-10-CM

## 2020-04-27 DIAGNOSIS — U071 COVID-19: Secondary | ICD-10-CM

## 2020-04-27 DIAGNOSIS — Z6841 Body Mass Index (BMI) 40.0 and over, adult: Secondary | ICD-10-CM

## 2020-04-27 HISTORY — DX: Bipolar disorder, unspecified: F31.9

## 2020-04-27 NOTE — Progress Notes (Signed)
Subjective:    Patient ID: Becky Hendricks, female    DOB: [birth year], 74 y.o.   MRN: [id number]  HPI   74 y.o. 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 She has undergone debridements  with general surgery a Polymicrobial with a fairly unfriendly Staphylococcus warneri  And PREVOTELLA DENTICOLA BACTEROIDES THETAIOTAOMICRON beta lactamase + from cultures in April.SHe had grown Strep hemolyticus and Staph Haemolyticus from bone culture in March  We treated her with IV vancomycin and unasyn into April. Along the way she had a drug rash to azetazolamide and was seen by Dr. Baxter Flattery from our group.  The patient had a cardiac arrest requiring hospitalization and has had recent fall from bed at the end of June.  She is accompanied by her brother today  She seems to be doing well off of the antibiotics and is getting WOC in the SNF.  I saw her in July and her wound appeared to be doing relatively well.  She returns to clinic today.  She is accompanied by EMS who transported her as well as her brother.  The brother was concerned about what appears to be contracture in her right hand.  This apparently happened after she was moved after her fall.  He tells me that there are various explanation as to why should she has this neurological problem.  I wonder if she may have had damage to the brachial plexus based on the way that she was carried in the way he described it.  This was worked up at [facility].  The wound apparently is doing better according to the patient.  She was herself perseverating about a "chip that is been implanted"  Past Medical History:  Diagnosis Date  . Atrial fibrillation ([place])   . Bipolar disorder ([place])   . Bipolar disorder ([place]) 04/27/2020  . Clostridium difficile diarrhea 08/02/2019  . Dehydration   . Essential hypertension   . Fall 02/11/2020  . Morbid obesity ([place])   . Type 2 diabetes mellitus ([place])   .  Weakness     Past Surgical History:  Procedure Laterality Date  . INCISION AND DRAINAGE PERIRECTAL ABSCESS Left 12/04/2019   Procedure: IRRIGATION AND DEBRIDEMENT LEFT BUTTOCK ABSCESS;  Surgeon: Georganna Skeans, MD;  Location: [facility];  Service: General;  Laterality: Left;  . INCISION AND DRAINAGE PERIRECTAL ABSCESS Left 12/10/2019   Procedure: REPEAT IRRIGATION AND DEBRIDEMENT BUTTOCK  ABSCESS;  Surgeon: Coralie Keens, MD;  Location: [facility];  Service: General;  Laterality: Left;    Family History  Family history unknown: Yes      Social History   Socioeconomic History  . Marital status: Single    Spouse name: Not on file  . Number of children: Not on file  . Years of education: Not on file  . Highest education level: Not on file  Occupational History  . Not on file  Tobacco Use  . Smoking status: Never Smoker  . Smokeless tobacco: Never Used  Vaping Use  . Vaping Use: Unknown  Substance and Sexual Activity  . Alcohol use: Not Currently  . Drug use: Not Currently  . Sexual activity: Not Currently    Birth control/protection: Post-menopausal  Other Topics Concern  . Not on file  Social History Narrative  . Not on file   Social Determinants of Health   Financial Resource Strain:   . Difficulty of Paying Living Expenses: Not on file  Food Insecurity:   .  Worried About Charity fundraiser in the Last Year: Not on file  . Ran Out of Food in the Last Year: Not on file  Transportation Needs:   . Lack of Transportation (Medical): Not on file  . Lack of Transportation (Non-Medical): Not on file  Physical Activity:   . Days of Exercise per Week: Not on file  . Minutes of Exercise per Session: Not on file  Stress:   . Feeling of Stress : Not on file  Social Connections:   . Frequency of Communication with Friends and Family: Not on file  . Frequency of Social Gatherings with Friends and Family: Not on file  . Attends Religious Services: Not on file  . Active Member of  Clubs or Organizations: Not on file  . Attends Archivist Meetings: Not on file  . Marital Status: Not on file    Allergies  Allergen Reactions  . Chlorhexidine Gluconate Itching  . Acetazolamide Er Rash     Current Outpatient Medications:  .  acetaminophen (TYLENOL) 325 MG tablet, Take 650 mg by mouth every 6 (six) hours as needed for mild pain or headache., Disp: , Rfl:  .  acetaminophen (TYLENOL) 325 MG tablet, Take 650 mg by mouth every 6 (six) hours as needed for mild pain or headache., Disp: , Rfl:  .  Amino Acids-Protein Hydrolys (FEEDING SUPPLEMENT, PRO-STAT SUGAR FREE 64,) LIQD, Take 30 mLs by mouth 2 (two) times daily., Disp: 887 mL, Rfl: 0 .  Amino Acids-Protein Hydrolys (FEEDING SUPPLEMENT, PRO-STAT SUGAR FREE 64,) LIQD, Take 30 mLs by mouth in the morning and at bedtime., Disp: , Rfl:  .  apixaban (ELIQUIS) 5 MG TABS tablet, Take 5 mg by mouth 2 (two) times daily., Disp: , Rfl:  .  ascorbic acid (VITAMIN C) 1000 MG tablet, Take 500 mg by mouth 2 (two) times daily., Disp: , Rfl:  .  ascorbic acid (VITAMIN C) 500 MG tablet, Take 1,000 mg by mouth 2 (two) times daily., Disp: , Rfl:  .  atorvastatin (LIPITOR) 10 MG tablet, Take 10 mg by mouth at bedtime., Disp: , Rfl:  .  calcium carbonate (TUMS - DOSED IN MG ELEMENTAL CALCIUM) 500 MG chewable tablet, Chew 1 tablet by mouth daily., Disp: , Rfl:  .  carvedilol (COREG) 25 MG tablet, Take 12.5 mg by mouth 2 (two) times daily with a meal., Disp: , Rfl:  .  Cholecalciferol (VITAMIN D) 125 MCG (5000 UT) CAPS, Take 10,000 Units by mouth once a week. Wednesday, Disp: , Rfl:  .  diphenhydrAMINE (BENADRYL) 2 % cream, Apply 1 application topically 3 (three) times daily as needed for itching., Disp: , Rfl:  .  divalproex (DEPAKOTE) 500 MG DR tablet, Take 500 mg by mouth 2 (two) times daily., Disp: , Rfl:  .  docusate sodium (COLACE) 100 MG capsule, Take 1 capsule (100 mg total) by mouth 2 (two) times daily as needed for mild  constipation., Disp: 10 capsule, Rfl: 0 .  docusate sodium (COLACE) 100 MG capsule, Take 100 mg by mouth 2 (two) times daily as needed for mild constipation., Disp: , Rfl:  .  Ensure Max Protein (ENSURE MAX PROTEIN) LIQD, Take 330 mLs (11 oz total) by mouth 2 (two) times daily., Disp: , Rfl:  .  famotidine (PEPCID) 20 MG tablet, Take 20 mg by mouth 2 (two) times daily., Disp: , Rfl:  .  furosemide (LASIX) 40 MG tablet, Take 40 mg by mouth., Disp: , Rfl:  .  hydrALAZINE (APRESOLINE) 25 MG tablet, Take 1 tablet (25 mg total) by mouth every 8 (eight) hours., Disp: , Rfl:  .  hydrocortisone cream 1 %, Apply topically 2 (two) times daily., Disp: 30 g, Rfl: 0 .  insulin glargine (LANTUS) 100 UNIT/ML injection, Inject 10 Units into the skin at bedtime. , Disp: , Rfl:  .  Ipratropium-Albuterol (COMBIVENT RESPIMAT) 20-100 MCG/ACT AERS respimat, Inhale 1 puff into the lungs every 6 (six) hours., Disp: , Rfl:  .  JANUMET 50-1000 MG tablet, Take 1 tablet by mouth 2 (two) times daily., Disp: , Rfl:  .  losartan (COZAAR) 25 MG tablet, Take 50 mg by mouth daily., Disp: , Rfl:  .  Multiple Vitamin (MULTIVITAMIN WITH MINERALS) TABS tablet, Take 1 tablet by mouth daily., Disp: , Rfl:  .  nutrition supplement, JUVEN, (JUVEN) PACK, Take 1 packet by mouth 2 (two) times daily between meals., Disp: , Rfl: 0 .  pantoprazole (PROTONIX) 40 MG tablet, Take 1 tablet (40 mg total) by mouth 2 (two) times daily., Disp: 30 tablet, Rfl: 0 .  sennosides-docusate sodium (SENOKOT-S) 8.6-50 MG tablet, Take 1 tablet by mouth daily., Disp: , Rfl:  .  sitaGLIPtin-metformin (JANUMET) 50-1000 MG tablet, Take 1 tablet by mouth 2 (two) times daily with a meal., Disp: , Rfl:  .  Zinc Gluconate 100 MG TABS, Take 220 mg by mouth daily., Disp: , Rfl:  .  zinc sulfate 220 (50 Zn) MG capsule, Take 220 mg by mouth daily., Disp: , Rfl:    Review of Systems  Unable to perform ROS: Dementia       Objective:   Physical Exam Constitutional:       General: She is not in acute distress.    Appearance: Normal appearance. She is well-developed. She is not ill-appearing or diaphoretic.  HENT:     Head: Normocephalic and atraumatic.     Right Ear: Hearing and external ear normal.     Left Ear: Hearing and external ear normal.     Nose: No nasal deformity or rhinorrhea.  Eyes:     General: No scleral icterus.    Conjunctiva/sclera: Conjunctivae normal.     Right eye: Right conjunctiva is not injected.     Left eye: Left conjunctiva is not injected.     Pupils: Pupils are equal, round, and reactive to light.  Neck:     Vascular: No JVD.  Cardiovascular:     Rate and Rhythm: Normal rate and regular rhythm.     Heart sounds: S1 normal and S2 normal.  Pulmonary:     Effort: Pulmonary effort is normal. No respiratory distress.     Breath sounds: No wheezing.  Abdominal:     General: Bowel sounds are normal. There is no distension.     Palpations: Abdomen is soft.     Tenderness: There is no abdominal tenderness.  Musculoskeletal:        General: Normal range of motion.     Right shoulder: Normal.     Left shoulder: Normal.     Cervical back: Normal range of motion and neck supple.     Right hip: Normal.     Left hip: Normal.     Right knee: Normal.     Left knee: Normal.  Lymphadenopathy:     Head:     Right side of head: No submandibular, preauricular or posterior auricular adenopathy.     Left side of head: No submandibular, preauricular or posterior auricular adenopathy.  Cervical: No cervical adenopathy.     Right cervical: No superficial or deep cervical adenopathy.    Left cervical: No superficial or deep cervical adenopathy.  Skin:    General: Skin is warm and dry.     Coloration: Skin is not pale.     Findings: No abrasion, bruising, ecchymosis, erythema, lesion or rash.     Nails: There is no clubbing.  Neurological:     Mental Status: She is alert.  Psychiatric:        Attention and Perception: She is  attentive.        Speech: Speech is tangential.        Behavior: Behavior is cooperative.        Thought Content: Thought content is delusional.        Cognition and Memory: Cognition is impaired. Memory is impaired. She exhibits impaired recent memory and impaired remote memory.    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

## 2020-04-28 LAB — BASIC METABOLIC PANEL WITH GFR
BUN/Creatinine Ratio: 46 (calc) — ABNORMAL HIGH (ref 6–22)
BUN: 29 mg/dL — ABNORMAL HIGH (ref 7–25)
CO2: 40 mmol/L — ABNORMAL HIGH (ref 20–32)
Calcium: 9.7 mg/dL (ref 8.6–10.4)
Chloride: 93 mmol/L — ABNORMAL LOW (ref 98–110)
Creat: 0.63 mg/dL (ref 0.60–0.93)
GFR, Est African American: 104 mL/min/{1.73_m2} (ref >=60)
GFR, Est Non African American: 90 mL/min/{1.73_m2} (ref >=60)
Glucose, Bld: 103 mg/dL — ABNORMAL HIGH (ref 65–99)
Potassium: 5 mmol/L (ref 3.5–5.3)
Sodium: 140 mmol/L (ref 135–146)

## 2020-04-28 LAB — CBC WITH DIFFERENTIAL/PLATELET
Absolute Monocytes: 1041 cells/uL — ABNORMAL HIGH (ref 200–950)
Basophils Absolute: 33 cells/uL (ref 0–200)
Basophils Relative: 0.4 %
Eosinophils Absolute: 221 cells/uL (ref 15–500)
Eosinophils Relative: 2.7 %
HCT: 35.2 % (ref 35.0–45.0)
Hemoglobin: 11.3 g/dL — ABNORMAL LOW (ref 11.7–15.5)
Lymphs Abs: 2296 cells/uL (ref 850–3900)
MCH: 28.8 pg (ref 27.0–33.0)
MCHC: 32.1 g/dL (ref 32.0–36.0)
MCV: 89.6 fL (ref 80.0–100.0)
MPV: 11.5 fL (ref 7.5–12.5)
Monocytes Relative: 12.7 %
Neutro Abs: 4608 cells/uL (ref 1500–7800)
Neutrophils Relative %: 56.2 %
Platelets: 252 10*3/uL (ref 140–400)
RBC: 3.93 10*6/uL (ref 3.80–5.10)
RDW: 14.5 % (ref 11.0–15.0)
Total Lymphocyte: 28 %
WBC: 8.2 10*3/uL (ref 3.8–10.8)

## 2020-04-28 LAB — C-REACTIVE PROTEIN: CRP: 7.7 mg/L (ref <8.0)

## 2020-04-28 LAB — SEDIMENTATION RATE: Sed Rate: 6 mm/h (ref 0–30)

## 2020-05-05 ENCOUNTER — Emergency Department (HOSPITAL_COMMUNITY): Payer: Medicare Other

## 2020-05-05 ENCOUNTER — Emergency Department (HOSPITAL_COMMUNITY)
Admission: EM | Admit: 2020-05-05 | Discharge: 2020-05-25 | Disposition: A | Discharge status: Home / Self Care | Payer: Medicare Other | Attending: Emergency Medicine | Admitting: Emergency Medicine

## 2020-05-05 ENCOUNTER — Encounter (HOSPITAL_COMMUNITY): Payer: Self-pay | Admitting: Emergency Medicine

## 2020-05-05 DIAGNOSIS — K8020 Calculus of gallbladder without cholecystitis without obstruction: Secondary | ICD-10-CM | POA: Insufficient documentation

## 2020-05-05 DIAGNOSIS — F3181 Bipolar II disorder: Secondary | ICD-10-CM | POA: Diagnosis not present

## 2020-05-05 DIAGNOSIS — Z79899 Other long term (current) drug therapy: Secondary | ICD-10-CM | POA: Insufficient documentation

## 2020-05-05 DIAGNOSIS — Z20822 Contact with and (suspected) exposure to covid-19: Secondary | ICD-10-CM | POA: Diagnosis not present

## 2020-05-05 DIAGNOSIS — I517 Cardiomegaly: Secondary | ICD-10-CM | POA: Insufficient documentation

## 2020-05-05 DIAGNOSIS — E119 Type 2 diabetes mellitus without complications: Secondary | ICD-10-CM | POA: Insufficient documentation

## 2020-05-05 DIAGNOSIS — I10 Essential (primary) hypertension: Secondary | ICD-10-CM | POA: Diagnosis not present

## 2020-05-05 DIAGNOSIS — I281 Aneurysm of pulmonary artery: Secondary | ICD-10-CM | POA: Insufficient documentation

## 2020-05-05 DIAGNOSIS — F29 Unspecified psychosis not due to a substance or known physiological condition: Secondary | ICD-10-CM

## 2020-05-05 DIAGNOSIS — J9811 Atelectasis: Secondary | ICD-10-CM | POA: Insufficient documentation

## 2020-05-05 DIAGNOSIS — Z794 Long term (current) use of insulin: Secondary | ICD-10-CM | POA: Insufficient documentation

## 2020-05-05 DIAGNOSIS — F319 Bipolar disorder, unspecified: Secondary | ICD-10-CM | POA: Diagnosis present

## 2020-05-05 DIAGNOSIS — F3160 Bipolar disorder, current episode mixed, unspecified: Secondary | ICD-10-CM | POA: Diagnosis not present

## 2020-05-05 LAB — SARS CORONAVIRUS 2 BY RT PCR (HOSPITAL ORDER, PERFORMED IN ~~LOC~~ HOSPITAL LAB): SARS Coronavirus 2: NEGATIVE

## 2020-05-05 LAB — CBC WITH DIFFERENTIAL/PLATELET
Abs Immature Granulocytes: 0.04 10*3/uL (ref 0.00–0.07)
Basophils Absolute: 0 10*3/uL (ref 0.0–0.1)
Basophils Relative: 0 %
Eosinophils Absolute: 0.2 10*3/uL (ref 0.0–0.5)
Eosinophils Relative: 2 %
HCT: 36.4 % (ref 36.0–46.0)
Hemoglobin: 11.3 g/dL — ABNORMAL LOW (ref 12.0–15.0)
Immature Granulocytes: 0 %
Lymphocytes Relative: 29 %
Lymphs Abs: 2.8 10*3/uL (ref 0.7–4.0)
MCH: 28.5 pg (ref 26.0–34.0)
MCHC: 31 g/dL (ref 30.0–36.0)
MCV: 91.7 fL (ref 80.0–100.0)
Monocytes Absolute: 1.1 10*3/uL — ABNORMAL HIGH (ref 0.1–1.0)
Monocytes Relative: 12 %
Neutro Abs: 5.5 10*3/uL (ref 1.7–7.7)
Neutrophils Relative %: 57 %
Platelets: 204 10*3/uL (ref 150–400)
RBC: 3.97 MIL/uL (ref 3.87–5.11)
RDW: 15.5 % (ref 11.5–15.5)
WBC: 9.7 10*3/uL (ref 4.0–10.5)
nRBC: 0 % (ref 0.0–0.2)

## 2020-05-05 LAB — CBG MONITORING, ED: Glucose-Capillary: 121 mg/dL — ABNORMAL HIGH (ref 70–99)

## 2020-05-05 LAB — COMPREHENSIVE METABOLIC PANEL
ALT: 13 U/L (ref 0–44)
AST: 14 U/L — ABNORMAL LOW (ref 15–41)
Albumin: 3.4 g/dL — ABNORMAL LOW (ref 3.5–5.0)
Alkaline Phosphatase: 37 U/L — ABNORMAL LOW (ref 38–126)
Anion gap: 13 (ref 5–15)
BUN: 25 mg/dL — ABNORMAL HIGH (ref 8–23)
CO2: 33 mmol/L — ABNORMAL HIGH (ref 22–32)
Calcium: 9.5 mg/dL (ref 8.9–10.3)
Chloride: 99 mmol/L (ref 98–111)
Creatinine, Ser: 0.61 mg/dL (ref 0.44–1.00)
GFR calc Af Amer: >60 mL/min (ref >60)
GFR calc non Af Amer: >60 mL/min (ref >60)
Glucose, Bld: 120 mg/dL — ABNORMAL HIGH (ref 70–99)
Potassium: 4.1 mmol/L (ref 3.5–5.1)
Sodium: 145 mmol/L (ref 135–145)
Total Bilirubin: 0.4 mg/dL (ref 0.3–1.2)
Total Protein: 7.2 g/dL (ref 6.5–8.1)

## 2020-05-05 LAB — ETHANOL: Alcohol, Ethyl (B): <10 mg/dL (ref <10)

## 2020-05-05 LAB — SEDIMENTATION RATE: Sed Rate: 33 mm/hr — ABNORMAL HIGH (ref 0–22)

## 2020-05-05 LAB — VALPROIC ACID LEVEL: Valproic Acid Lvl: 32 ug/mL — ABNORMAL LOW (ref 50.0–100.0)

## 2020-05-05 LAB — C-REACTIVE PROTEIN: CRP: 0.7 mg/dL (ref <1.0)

## 2020-05-05 IMAGING — CT CT HEAD W/O CM
3 series · 14 of 47 positions shown, 16 images · non-contrast
Comparison: [DATE]

CLINICAL DATA: Delirium

EXAM:
CT HEAD WITHOUT CONTRAST
TECHNIQUE: Contiguous axial images were obtained from the base of the skull
through the vertex without intravenous contrast.

[Series 2: head wo · axial · 0.44mm/px · z∈[-66,+59]mm · 8 of 30 slices shown, 10 images]
[im 3/30  brain]
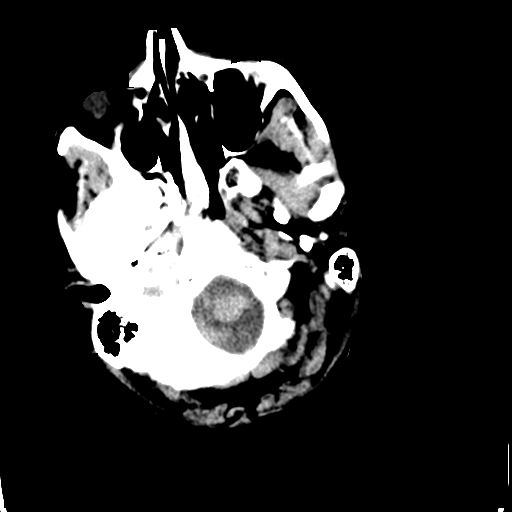
[im 3/30  bone]
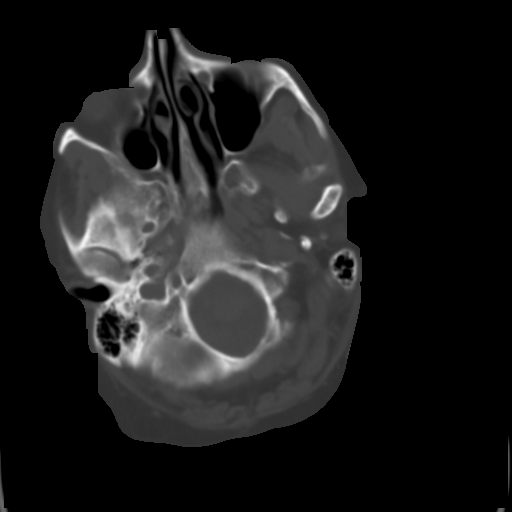
[im 7/30  brain]
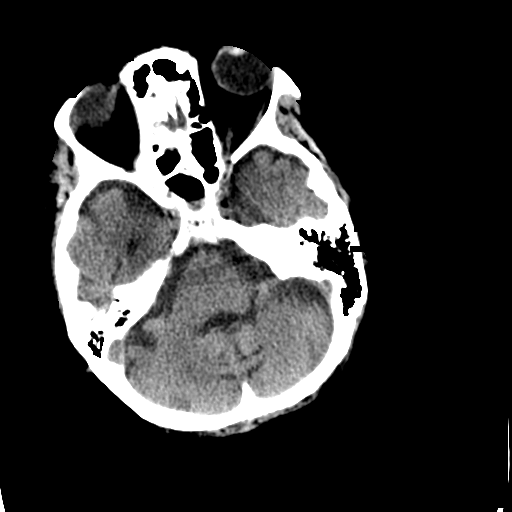
[im 10/30  brain]
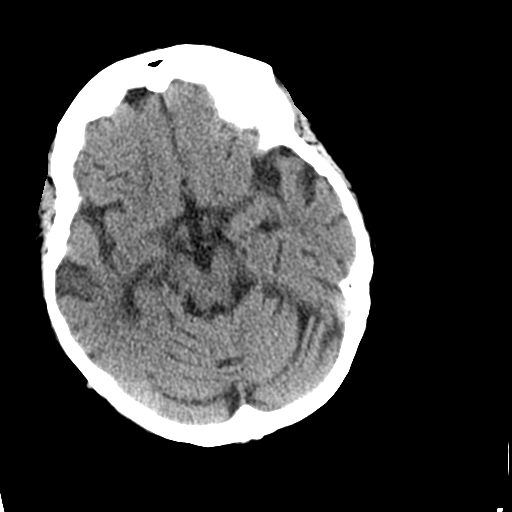
[im 14/30  brain]
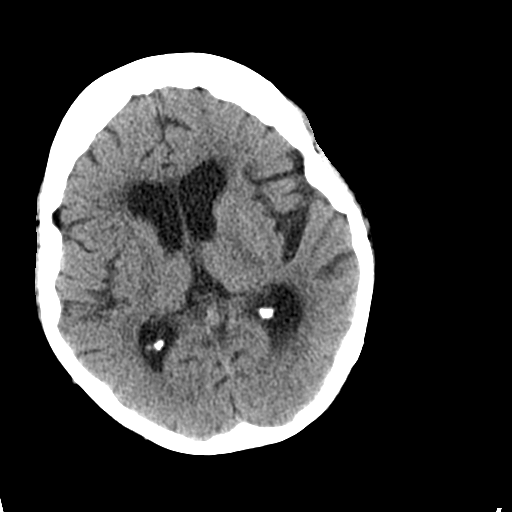
[im 17/30  brain]
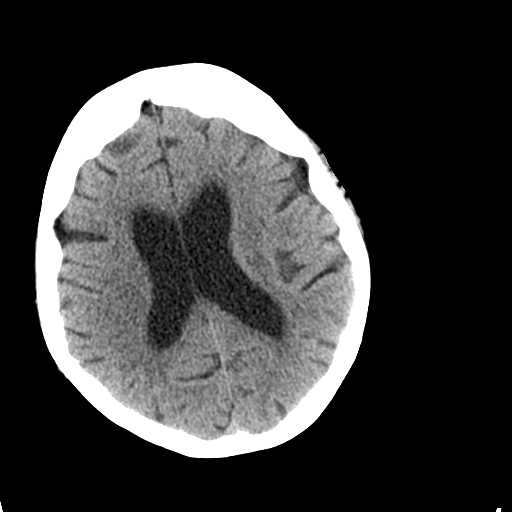
[im 17/30  bone]
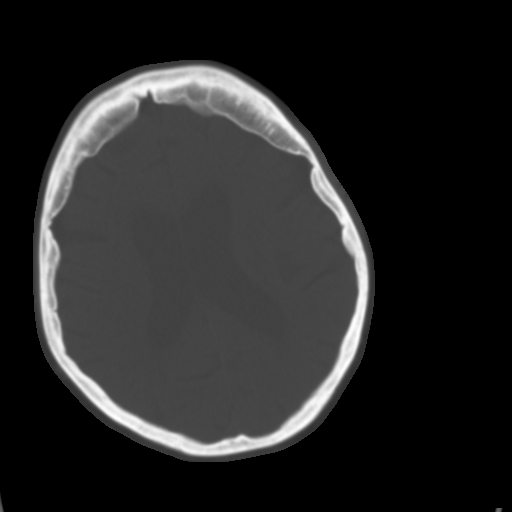
[im 21/30  brain]
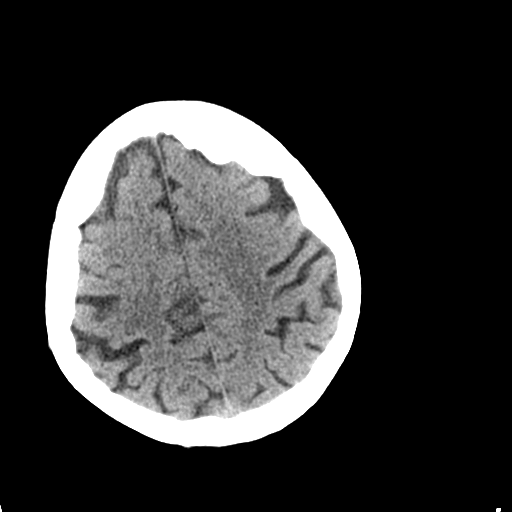
[im 24/30  brain]
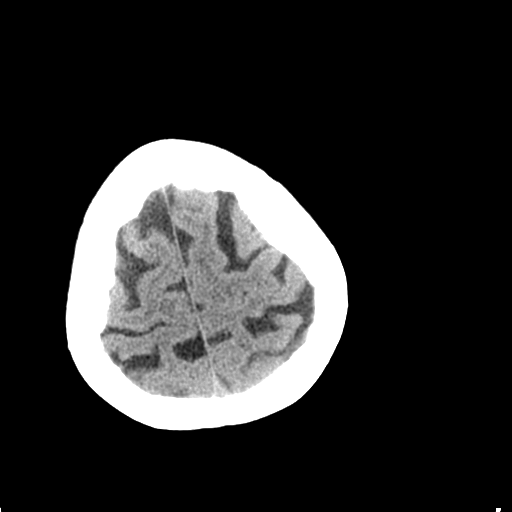
[im 28/30  brain]
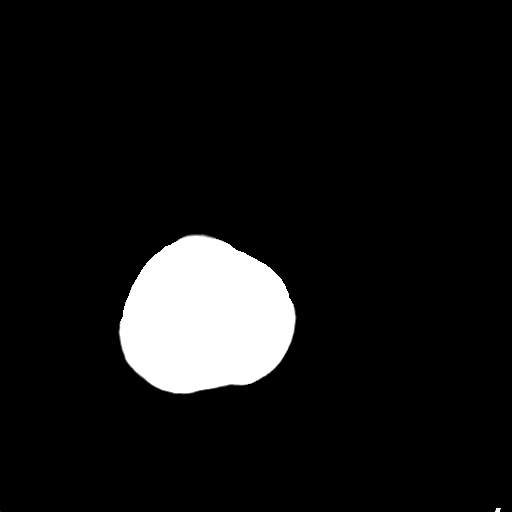

[Series 5: coronal soft tissue · coronal · 0.29mm/px · 3 of 77 slices shown]
[im 26/77  brain]
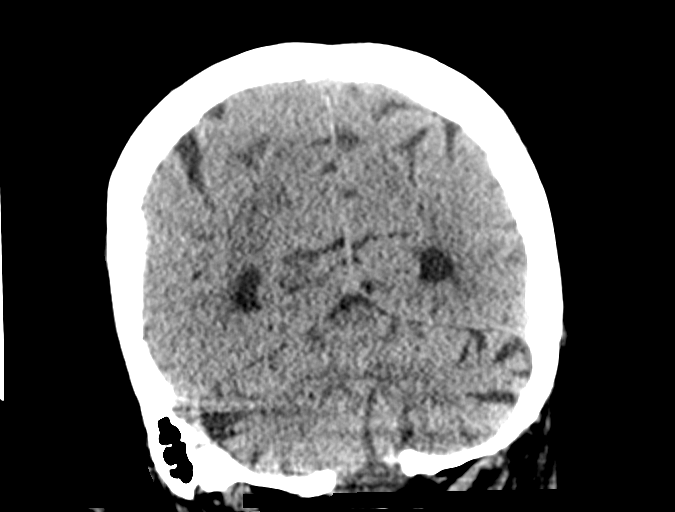
[im 34/77  brain]
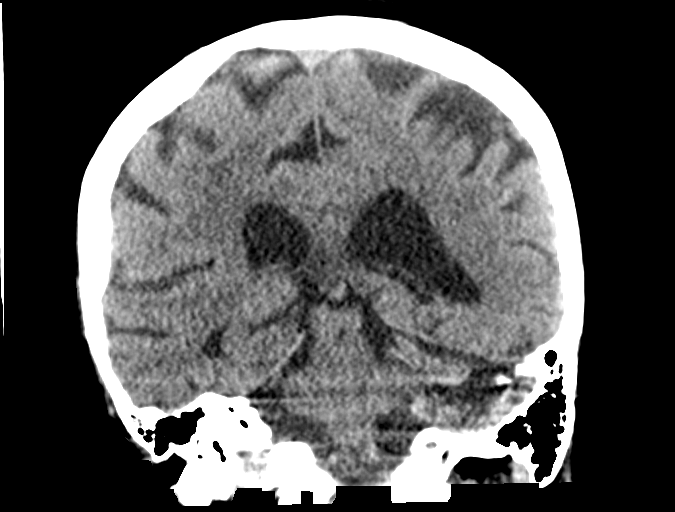
[im 43/77  brain]
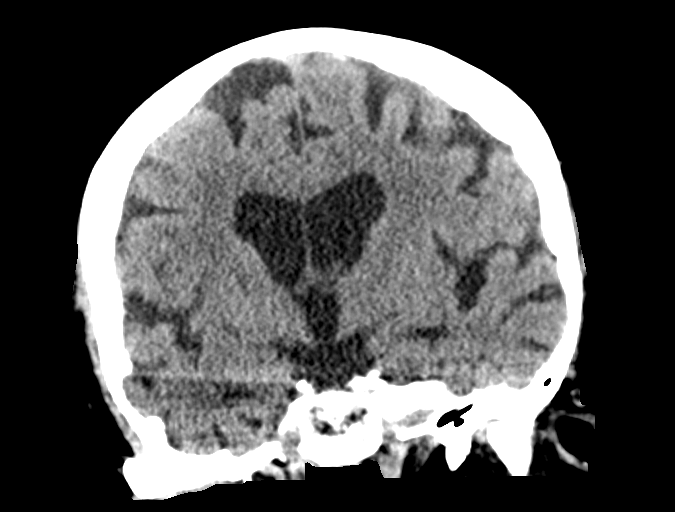

[Series 6: sagittal soft tissue · sagittal · 0.29mm/px · 3 of 67 slices shown]
[im 23/67  brain]
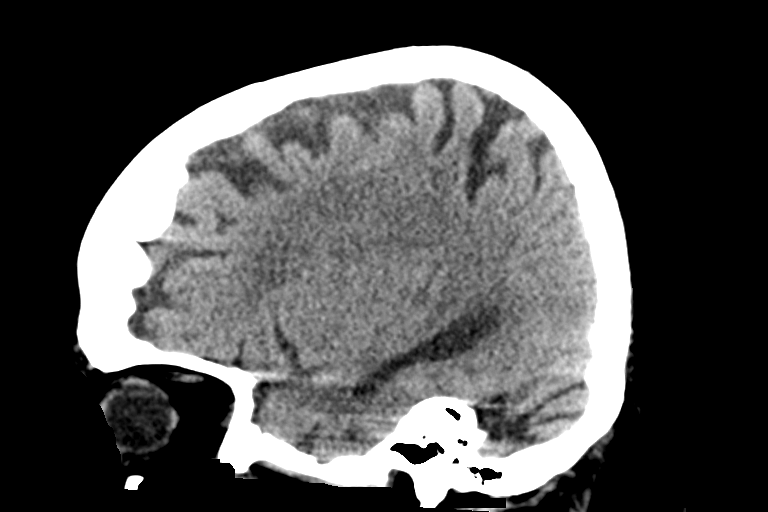
[im 34/67  brain]
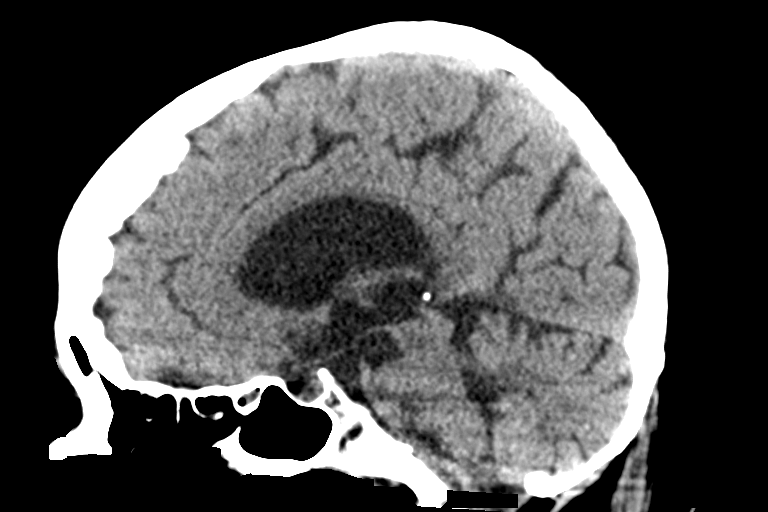
[im 45/67  brain]
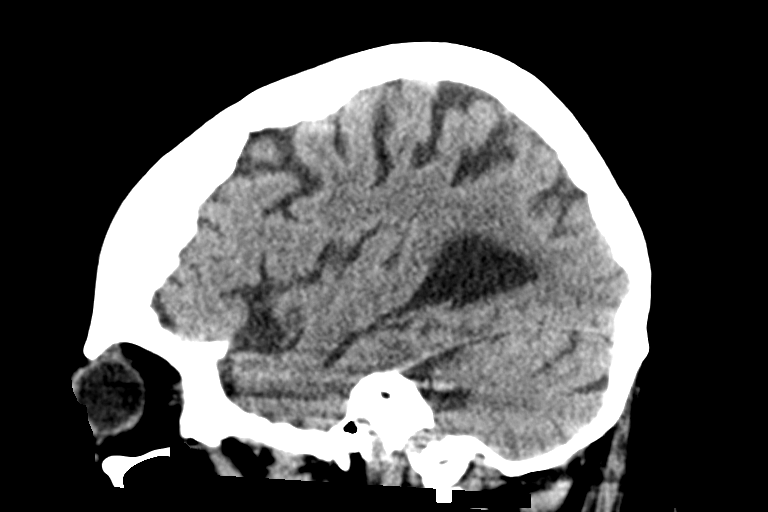

[14 of 47 positions shown; findings below may reference images not displayed]

FINDINGS: Brain: No evidence of acute large vascular territory infarction,
acute hemorrhage, hydrocephalus, extra-axial collection or mass
lesion/mass effect. Similar patchy white matter hypoattenuation,
likely the sequela of chronic microvascular ischemic disease.
Similar moderate diffuse cerebral and cerebellar atrophy with ex
vacuo ventricular dilation.

Vascular: Calcific intracranial atherosclerosis.

Skull: Normal. Negative for fracture or focal lesion.

Sinuses/Orbits: Mild scattered ethmoid air cell mucosal thickening.
Otherwise, the sinuses are clear without air-fluid levels. Negative
orbits.

Other: No mastoid effusions.
IMPRESSION: 1. No acute intracranial abnormality.
2. Similar presumed chronic microvascular ischemic disease and
cerebral atrophy.

## 2020-05-05 IMAGING — CT CT CHEST W/O CM
2 of 3 series · 15 of 36 positions shown, 18 images · non-contrast
Comparison: [DATE]

CLINICAL DATA: Delirium.

EXAM:
CT CHEST WITHOUT CONTRAST
TECHNIQUE: Multidetector CT imaging of the chest was performed following the
standard protocol without IV contrast.

[Series 2: thorax · axial · 0.73mm/px · z∈[-458,-206]mm · 12 of 148 slices shown, 15 images]
[im 11/148  mediastinal]
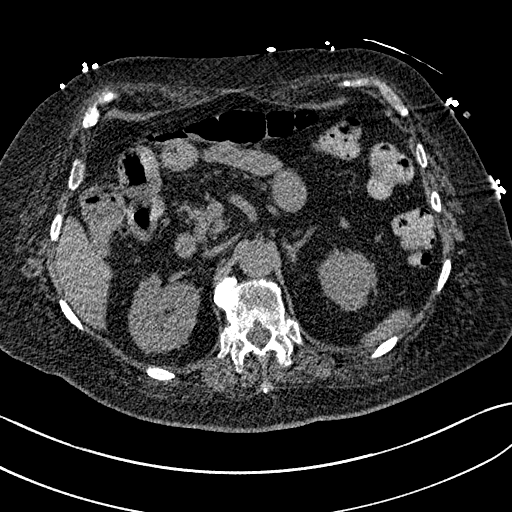
[im 11/148  lung]
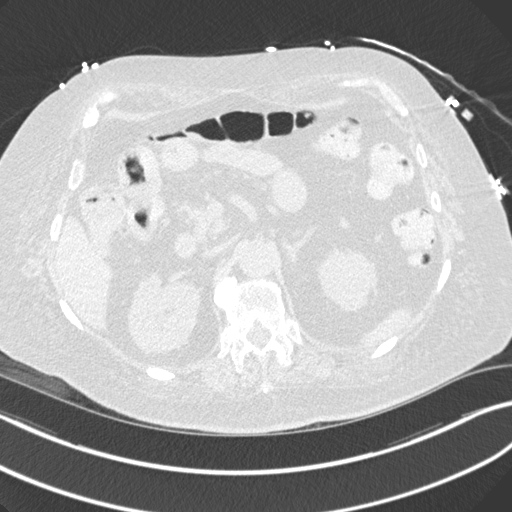
[im 22/148  lung]
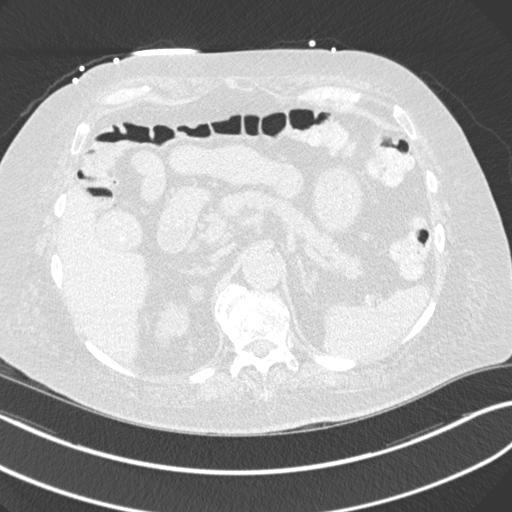
[im 33/148  lung]
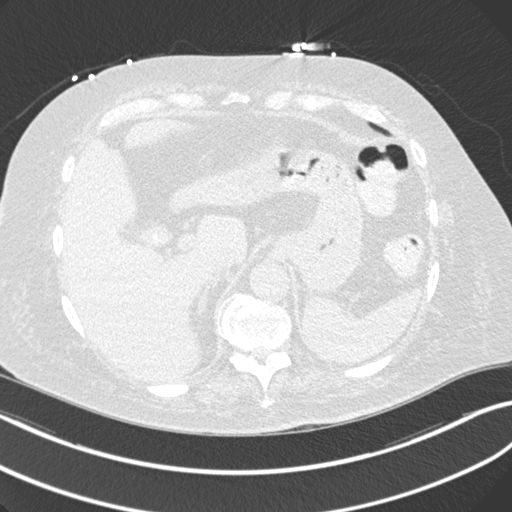
[im 44/148  lung]
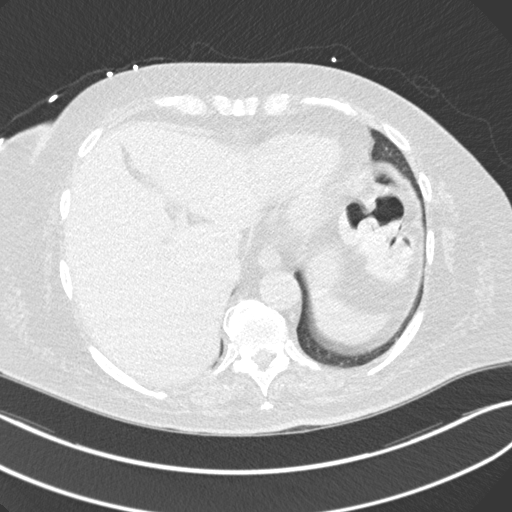
[im 55/148  mediastinal]
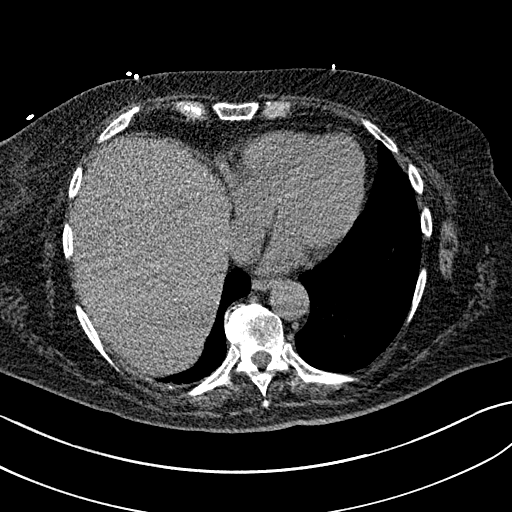
[im 55/148  lung]
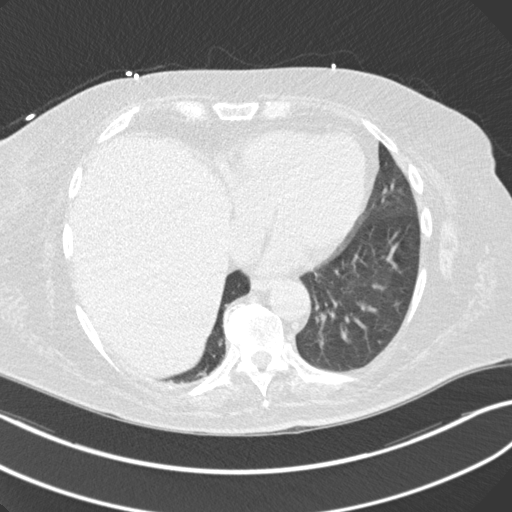
[im 66/148  lung]
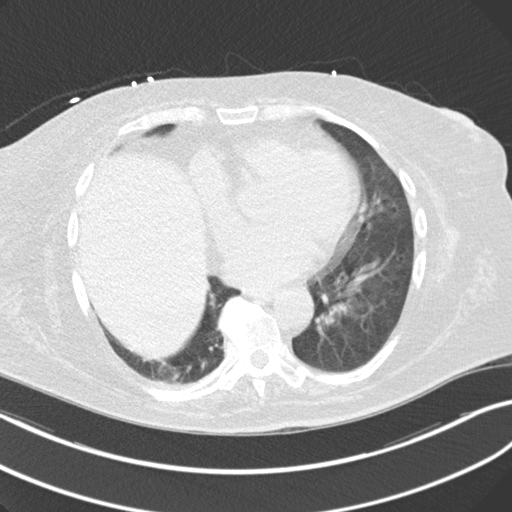
[im 82/148  lung]
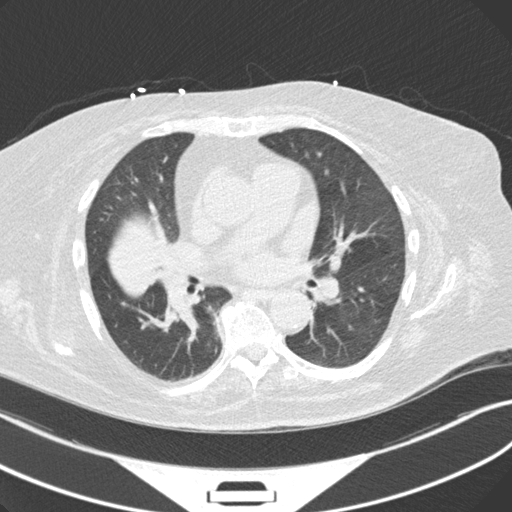
[im 93/148  lung]
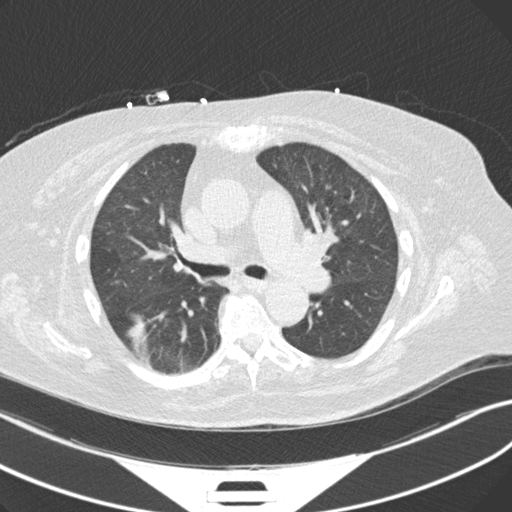
[im 104/148  mediastinal]
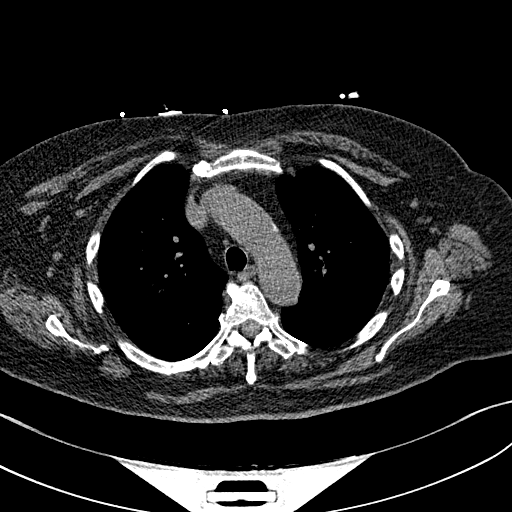
[im 104/148  lung]
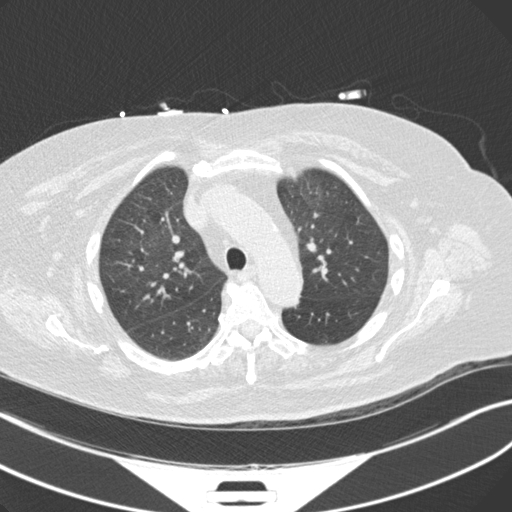
[im 115/148  lung]
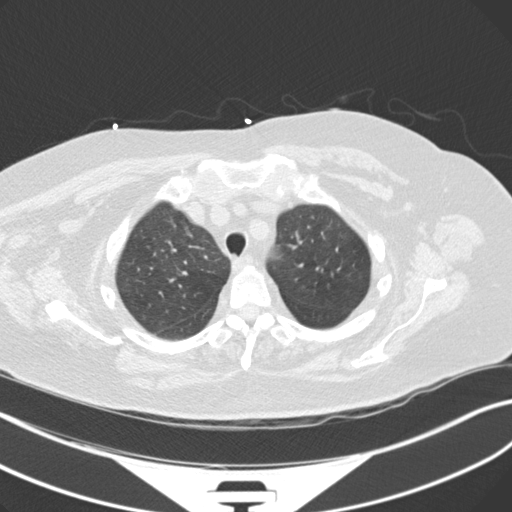
[im 126/148  lung]
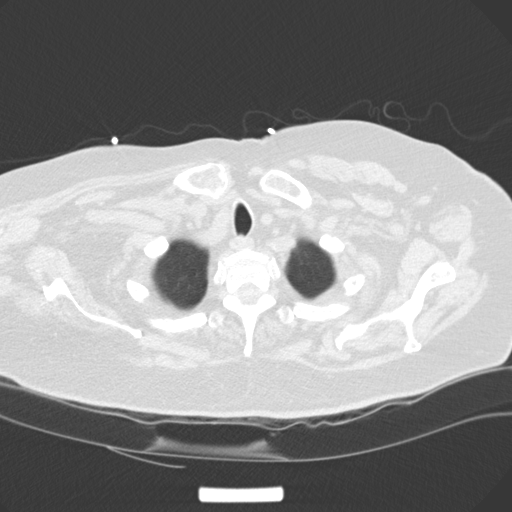
[im 137/148  lung]
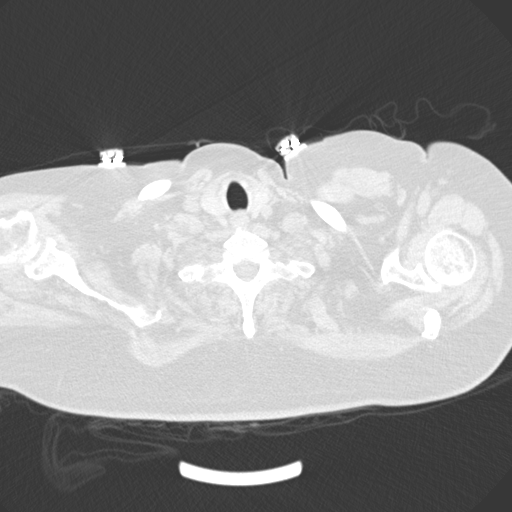

[Series 6: coronal · coronal · 0.63mm/px · 3 of 151 slices shown]
[im 31/151  lung]
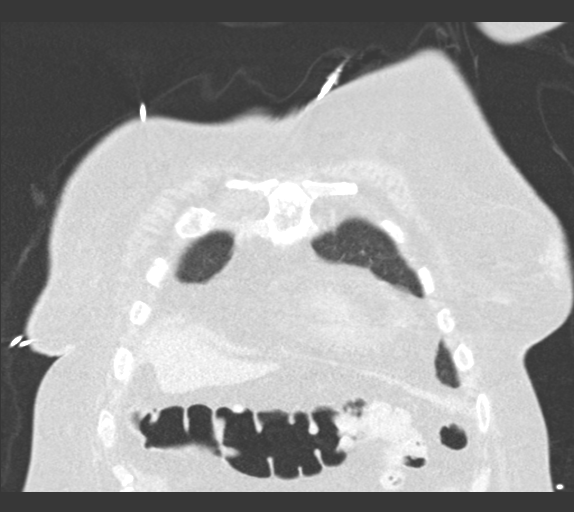
[im 61/151  lung]
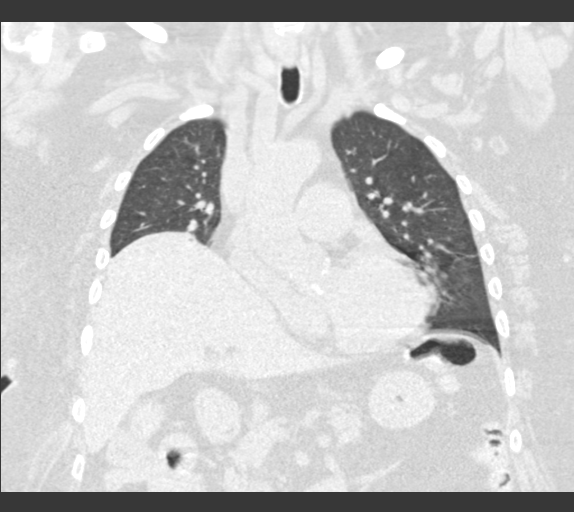
[im 91/151  lung]
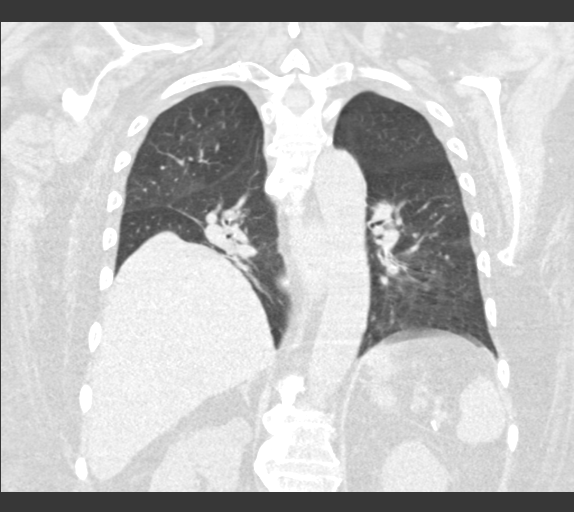

[15 of 36 positions shown; findings below may reference images not displayed]

FINDINGS: Cardiovascular: The heart size is enlarged. Main pulmonary artery is
dilated measuring 3.3 cm. There are mild atherosclerotic changes of
the thoracic aorta without evidence for an aneurysm.

Mediastinum/Nodes:

-- No mediastinal lymphadenopathy.

-- No hilar lymphadenopathy.

-- No axillary lymphadenopathy.

-- No supraclavicular lymphadenopathy.

-- Normal thyroid gland where visualized.

-  Unremarkable esophagus.

Lungs/Pleura: There is atelectasis at the right lung base. The
trachea is unremarkable. There is no significant pleural effusion.

Upper Abdomen: Contrast bolus timing is not optimized for evaluation
of the abdominal organs. There is cholelithiasis without secondary
signs of acute cholecystitis.

Musculoskeletal: No chest wall abnormality. No bony spinal canal
stenosis.
IMPRESSION: 1. Atelectasis in the right lower lobe.
2. Cardiomegaly.
3. Dilated main pulmonary artery which can be seen in patients with
elevated pulmonary artery pressures.
4. Cholelithiasis without secondary signs of acute cholecystitis.

Aortic Atherosclerosis ([ML]-[ML]).

## 2020-05-05 MED ORDER — ATORVASTATIN CALCIUM 10 MG PO TABS
10.0000 mg | ORAL_TABLET | Freq: Every day | ORAL | Status: DC
  Administered 2020-05-05 – 2020-05-24 (×7): 10 mg via ORAL
  Filled 2020-05-05 (×14): qty 1

## 2020-05-05 MED ORDER — SITAGLIPTIN PHOS-METFORMIN HCL 50-1000 MG PO TABS: 1.0000 | ORAL_TABLET | Freq: Two times a day (BID) | ORAL | Status: DC

## 2020-05-05 MED ORDER — JUVEN PO PACK
1.0000 | PACK | Freq: Two times a day (BID) | ORAL | Status: DC
  Administered 2020-05-06 – 2020-05-20 (×5): 1 via ORAL
  Filled 2020-05-05 (×41): qty 1

## 2020-05-05 MED ORDER — DOCUSATE SODIUM 100 MG PO CAPS: 100.0000 mg | ORAL_CAPSULE | Freq: Two times a day (BID) | ORAL | Status: DC | PRN

## 2020-05-05 MED ORDER — PRO-STAT SUGAR FREE PO LIQD
30.0000 mL | Freq: Two times a day (BID) | ORAL | Status: DC
  Administered 2020-05-06 – 2020-05-20 (×2): 30 mL via ORAL
  Filled 2020-05-05 (×41): qty 30

## 2020-05-05 MED ORDER — ADULT MULTIVITAMIN W/MINERALS CH
1.0000 | ORAL_TABLET | Freq: Every day | ORAL | Status: DC
  Administered 2020-05-05 – 2020-05-25 (×12): 1 via ORAL
  Filled 2020-05-05 (×18): qty 1

## 2020-05-05 MED ORDER — SENNOSIDES-DOCUSATE SODIUM 8.6-50 MG PO TABS
1.0000 | ORAL_TABLET | Freq: Every day | ORAL | Status: DC
  Administered 2020-05-05 – 2020-05-25 (×10): 1 via ORAL
  Filled 2020-05-05 (×15): qty 1

## 2020-05-05 MED ORDER — CALCIUM CARBONATE ANTACID 500 MG PO CHEW
1.0000 | CHEWABLE_TABLET | Freq: Every day | ORAL | Status: DC
  Administered 2020-05-05 – 2020-05-25 (×9): 200 mg via ORAL
  Filled 2020-05-05 (×14): qty 1

## 2020-05-05 MED ORDER — SITAGLIPTIN PHOSPHATE 25 MG PO TABS
50.0000 mg | ORAL_TABLET | Freq: Two times a day (BID) | ORAL | Status: DC
  Filled 2020-05-05: qty 2

## 2020-05-05 MED ORDER — IPRATROPIUM-ALBUTEROL 20-100 MCG/ACT IN AERS
1.0000 | INHALATION_SPRAY | Freq: Four times a day (QID) | RESPIRATORY_TRACT | Status: DC
  Administered 2020-05-06 – 2020-05-08 (×4): 1 via RESPIRATORY_TRACT
  Filled 2020-05-05: qty 4

## 2020-05-05 MED ORDER — ENSURE MAX PROTEIN PO LIQD
11.0000 [oz_av] | Freq: Two times a day (BID) | ORAL | Status: DC
  Administered 2020-05-06 – 2020-05-15 (×3): 11 [oz_av] via ORAL
  Filled 2020-05-05 (×41): qty 330

## 2020-05-05 MED ORDER — INSULIN GLARGINE 100 UNIT/ML ~~LOC~~ SOLN
10.0000 [IU] | Freq: Every day | SUBCUTANEOUS | Status: DC
  Administered 2020-05-17 – 2020-05-21 (×3): 10 [IU] via SUBCUTANEOUS
  Filled 2020-05-05 (×21): qty 0.1

## 2020-05-05 MED ORDER — DIVALPROEX SODIUM 500 MG PO DR TAB
500.0000 mg | DELAYED_RELEASE_TABLET | Freq: Two times a day (BID) | ORAL | Status: DC
  Administered 2020-05-05 – 2020-05-06 (×2): 500 mg via ORAL
  Filled 2020-05-05 (×3): qty 1

## 2020-05-05 MED ORDER — DIPHENHYDRAMINE-ZINC ACETATE 2-0.1 % EX CREA
1.0000 "application " | TOPICAL_CREAM | Freq: Three times a day (TID) | CUTANEOUS | Status: DC | PRN
  Filled 2020-05-05: qty 28

## 2020-05-05 MED ORDER — CARVEDILOL 12.5 MG PO TABS
12.5000 mg | ORAL_TABLET | Freq: Two times a day (BID) | ORAL | Status: DC
  Administered 2020-05-05 – 2020-05-25 (×20): 12.5 mg via ORAL
  Filled 2020-05-05 (×30): qty 1

## 2020-05-05 MED ORDER — LOSARTAN POTASSIUM 50 MG PO TABS
50.0000 mg | ORAL_TABLET | Freq: Every day | ORAL | Status: DC
  Administered 2020-05-05 – 2020-05-25 (×12): 50 mg via ORAL
  Filled 2020-05-05 (×21): qty 1

## 2020-05-05 MED ORDER — ACETAMINOPHEN 325 MG PO TABS: 650.0000 mg | ORAL_TABLET | Freq: Four times a day (QID) | ORAL | Status: DC | PRN

## 2020-05-05 MED ORDER — PANTOPRAZOLE SODIUM 40 MG PO TBEC
40.0000 mg | DELAYED_RELEASE_TABLET | Freq: Two times a day (BID) | ORAL | Status: DC
  Administered 2020-05-05 – 2020-05-25 (×18): 40 mg via ORAL
  Filled 2020-05-05 (×31): qty 1

## 2020-05-05 MED ORDER — HYDRALAZINE HCL 25 MG PO TABS
25.0000 mg | ORAL_TABLET | Freq: Three times a day (TID) | ORAL | Status: DC
  Administered 2020-05-05 – 2020-05-25 (×14): 25 mg via ORAL
  Filled 2020-05-05 (×23): qty 1

## 2020-05-05 MED ORDER — ZINC SULFATE 220 (50 ZN) MG PO CAPS
220.0000 mg | ORAL_CAPSULE | Freq: Every day | ORAL | Status: DC
  Administered 2020-05-06 – 2020-05-25 (×12): 220 mg via ORAL
  Filled 2020-05-05 (×18): qty 1

## 2020-05-05 MED ORDER — ASCORBIC ACID 500 MG PO TABS
500.0000 mg | ORAL_TABLET | Freq: Two times a day (BID) | ORAL | Status: DC
  Administered 2020-05-05 – 2020-05-25 (×17): 500 mg via ORAL
  Filled 2020-05-05 (×39): qty 1

## 2020-05-05 MED ORDER — FAMOTIDINE 20 MG PO TABS
20.0000 mg | ORAL_TABLET | Freq: Two times a day (BID) | ORAL | Status: DC
  Administered 2020-05-05 – 2020-05-25 (×18): 20 mg via ORAL
  Filled 2020-05-05 (×30): qty 1

## 2020-05-05 MED ORDER — METFORMIN HCL 500 MG PO TABS: 1000.0000 mg | ORAL_TABLET | Freq: Two times a day (BID) | ORAL | Status: DC

## 2020-05-05 MED ORDER — FUROSEMIDE 40 MG PO TABS
40.0000 mg | ORAL_TABLET | Freq: Every day | ORAL | Status: DC
  Administered 2020-05-05 – 2020-05-25 (×13): 40 mg via ORAL
  Filled 2020-05-05 (×20): qty 1

## 2020-05-05 MED ORDER — APIXABAN 5 MG PO TABS
5.0000 mg | ORAL_TABLET | Freq: Two times a day (BID) | ORAL | Status: DC
  Administered 2020-05-06 – 2020-05-25 (×16): 5 mg via ORAL
  Filled 2020-05-05 (×41): qty 1

## 2020-05-05 NOTE — ED Provider Notes (Signed)
[facility]-EMERGENCY DEPT Provider Note   CSN: [id number] Arrival date & time: 05/05/20  1235  LEVEL 5 CAVEAT - ALTERED MENTAL STATUS  History Chief Complaint  Patient presents with  . behavioral eval    Becky Hendricks is a 74 y.o. female.  HPI 74 year old female presents from her nursing facility with altered mental status.  The provider sent her out because of psychosis, unclear if it is her psychiatric disease versus infection.  A urinalysis was reportedly negative.  No fevers.  History is primarily taken from [name], who is the Publishing copy.  Thinks she has maybe had some recent medication changes but is not sure what.  Has a longstanding psychiatric history.  The patient talks to me like I have seen her often before, which to my knowledge I have not.  She also talks about how she needs to go to the maternity ward as she is pregnant and has been pregnant since 1965.  Nursing director also indicated that the patient has been a little agitated and saying inappropriate things, such as "suck my dick".   Past Medical History:  Diagnosis Date  . Atrial fibrillation (HCC)   . Bipolar disorder (HCC)   . Bipolar disorder (HCC) 04/27/2020  . Clostridium difficile diarrhea 08/02/2019  . Dehydration   . Essential hypertension   . Fall 02/11/2020  . Morbid obesity (HCC)   . Type 2 diabetes mellitus (HCC)   . Weakness     Patient Active Problem List   Diagnosis Date Noted  . Bipolar disorder (HCC) 04/27/2020  . Fall 02/11/2020  . Drug rash   . Acute osteomyelitis of pelvic region and thigh (HCC)   . Palliative care by specialist   . Goals of care, counseling/discussion   . Edema of upper extremity   . Hyperphosphatemia   . Cardiac arrest (HCC)   . Encounter for central line placement   . Acute respiratory failure (HCC)   . Sacral wound   . Hypoxia   . Severe sepsis (HCC) 12/03/2019  . Osteomyelitis (HCC) 12/03/2019  . Abscess of multiple sites  of buttock 12/03/2019  . AF (paroxysmal atrial fibrillation) (HCC) 12/03/2019  . Respiratory failure with hypoxia (HCC) 09/15/2019  . Pneumonia due to COVID-19 virus 09/15/2019  . Acute metabolic encephalopathy 09/15/2019  . UTI (urinary tract infection), bacterial 09/15/2019  . Pressure injury of skin 09/10/2019  . AKI (acute kidney injury) (HCC)   . Acute respiratory distress syndrome (ARDS) due to COVID-19 virus (HCC) 09/07/2019  . Morbid obesity with BMI of 40.0-44.9, adult (HCC) 02/27/2018    Past Surgical History:  Procedure Laterality Date  . INCISION AND DRAINAGE PERIRECTAL ABSCESS Left 12/04/2019   Procedure: IRRIGATION AND DEBRIDEMENT LEFT BUTTOCK ABSCESS;  Surgeon: Violeta Gelinas, MD;  Location: [facility] OR;  Service: General;  Laterality: Left;  . INCISION AND DRAINAGE PERIRECTAL ABSCESS Left 12/10/2019   Procedure: REPEAT IRRIGATION AND DEBRIDEMENT BUTTOCK  ABSCESS;  Surgeon: Abigail Miyamoto, MD;  Location: [facility] OR;  Service: General;  Laterality: Left;     OB History   No obstetric history on file.     Family History  Family history unknown: Yes    Social History   Tobacco Use  . Smoking status: Never Smoker  . Smokeless tobacco: Never Used  Vaping Use  . Vaping Use: Unknown  Substance Use Topics  . Alcohol use: Not Currently  . Drug use: Not Currently    Home Medications Prior to Admission medications   Medication  Sig Start Date End Date Taking? Authorizing Provider  acetaminophen (TYLENOL) 325 MG tablet Take 650 mg by mouth every 6 (six) hours as needed for mild pain or headache.    [provider]  acetaminophen (TYLENOL) 325 MG tablet Take 650 mg by mouth every 6 (six) hours as needed for mild pain or headache.    [provider]  Amino Acids-Protein Hydrolys (FEEDING SUPPLEMENT, PRO-STAT SUGAR FREE 64,) LIQD Take 30 mLs by mouth 2 (two) times daily. 01/05/20   Regalado, Belkys A, MD  Amino Acids-Protein Hydrolys (FEEDING SUPPLEMENT, PRO-STAT  SUGAR FREE 64,) LIQD Take 30 mLs by mouth in the morning and at bedtime.    [provider]  apixaban (ELIQUIS) 5 MG TABS tablet Take 5 mg by mouth 2 (two) times daily.    [provider]  ascorbic acid (VITAMIN C) 1000 MG tablet Take 500 mg by mouth 2 (two) times daily. 09/08/12   [provider]  ascorbic acid (VITAMIN C) 500 MG tablet Take 1,000 mg by mouth 2 (two) times daily.    [provider]  atorvastatin (LIPITOR) 10 MG tablet Take 10 mg by mouth at bedtime.    [provider]  calcium carbonate (TUMS - DOSED IN MG ELEMENTAL CALCIUM) 500 MG chewable tablet Chew 1 tablet by mouth daily.    [provider]  carvedilol (COREG) 25 MG tablet Take 12.5 mg by mouth 2 (two) times daily with a meal.    [provider]  Cholecalciferol (VITAMIN D) 125 MCG (5000 UT) CAPS Take 10,000 Units by mouth once a week. Wednesday    [provider]  diphenhydrAMINE (BENADRYL) 2 % cream Apply 1 application topically 3 (three) times daily as needed for itching.    [provider]  divalproex (DEPAKOTE) 500 MG DR tablet Take 500 mg by mouth 2 (two) times daily.    [provider]  docusate sodium (COLACE) 100 MG capsule Take 1 capsule (100 mg total) by mouth 2 (two) times daily as needed for mild constipation. 01/05/20   Regalado, Belkys A, MD  docusate sodium (COLACE) 100 MG capsule Take 100 mg by mouth 2 (two) times daily as needed for mild constipation.    [provider]  Ensure Max Protein (ENSURE MAX PROTEIN) LIQD Take 330 mLs (11 oz total) by mouth 2 (two) times daily. 01/27/20   Lonia Blood, MD  famotidine (PEPCID) 20 MG tablet Take 20 mg by mouth 2 (two) times daily.    [provider]  furosemide (LASIX) 40 MG tablet Take 40 mg by mouth.    [provider]  hydrALAZINE (APRESOLINE) 25 MG tablet Take 1 tablet (25 mg total) by mouth every 8 (eight) hours. 01/23/20   Narda Bonds, MD    hydrocortisone cream 1 % Apply topically 2 (two) times daily. 01/05/20   Regalado, Belkys A, MD  insulin glargine (LANTUS) 100 UNIT/ML injection Inject 10 Units into the skin at bedtime.     [provider]  Ipratropium-Albuterol (COMBIVENT RESPIMAT) 20-100 MCG/ACT AERS respimat Inhale 1 puff into the lungs every 6 (six) hours.    [provider]  JANUMET 50-1000 MG tablet Take 1 tablet by mouth 2 (two) times daily. 10/30/19   [provider]  losartan (COZAAR) 25 MG tablet Take 50 mg by mouth daily.    [provider]  Multiple Vitamin (MULTIVITAMIN WITH MINERALS) TABS tablet Take 1 tablet by mouth daily.    [provider]  nutrition supplement, JUVEN, (JUVEN) PACK Take 1 packet by mouth 2 (two) times daily between meals. 01/27/20   Lonia Blood, MD  pantoprazole (PROTONIX) 40 MG tablet Take 1 tablet (40 mg total) by mouth 2 (two) times daily. 01/05/20   Regalado, Belkys A, MD  sennosides-docusate sodium (SENOKOT-S) 8.6-50 MG tablet Take 1 tablet by mouth daily.    [provider]  sitaGLIPtin-metformin (JANUMET) 50-1000 MG tablet Take 1 tablet by mouth 2 (two) times daily with a meal.    [provider]  Zinc Gluconate 100 MG TABS Take 220 mg by mouth daily.    [provider]  zinc sulfate 220 (50 Zn) MG capsule Take 220 mg by mouth daily.    [provider]    Allergies    Chlorhexidine gluconate and Acetazolamide er  Review of Systems   Review of Systems  Unable to perform ROS: Mental status change    Physical Exam Updated Vital Signs BP 140/87 (BP Location: Right Arm)   Pulse 71   Temp 98.4 F (36.9 C) (Oral)   Resp (!) 25   LMP  (LMP Unknown)   SpO2 94%   Physical Exam Vitals and nursing note reviewed.  Constitutional:      Appearance: She is well-developed.  HENT:     Head: Normocephalic and atraumatic.     Right Ear: External ear normal.     Left Ear: External ear normal.     Nose:  Nose normal.  Eyes:     General:        Right eye: No discharge.        Left eye: No discharge.     Extraocular Movements: Extraocular movements intact.     Pupils: Pupils are equal, round, and reactive to light.  Cardiovascular:     Rate and Rhythm: Normal rate and regular rhythm.     Heart sounds: Normal heart sounds.  Pulmonary:     Effort: Pulmonary effort is normal.     Breath sounds: Normal breath sounds.  Abdominal:     General: There is no distension.     Palpations: Abdomen is soft.     Tenderness: There is no abdominal tenderness.  Skin:    General: Skin is warm and dry.     Comments: Sacral decubitus ulcer with no obvious inflammatory changes  Neurological:     Mental Status: She is alert. She is disoriented.     Comments: CN 3-12 grossly intact. 5/5 strength in all 4 extremities. Grossly normal sensation.  Psychiatric:        Mood and Affect: Mood is not anxious.        Thought Content: Thought content is delusional.     ED Results / Procedures / Treatments   Labs (all labs ordered are listed, but only abnormal results are displayed) Labs Reviewed  COMPREHENSIVE METABOLIC PANEL - Abnormal; Notable for the following components:      Result Value   CO2 33 (*)    Glucose, Bld 120 (*)    BUN 25 (*)    Albumin 3.4 (*)    AST 14 (*)    Alkaline Phosphatase 37 (*)    All other components within normal limits  CBC WITH DIFFERENTIAL/PLATELET - Abnormal; Notable for the following components:   Hemoglobin 11.3 (*)    Monocytes Absolute 1.1 (*)    All other components within normal limits  VALPROIC ACID LEVEL - Abnormal; Notable for the following components:   Valproic Acid  Lvl 32 (*)    All other components within normal limits  SEDIMENTATION RATE - Abnormal; Notable for the following components:   Sed Rate 33 (*)    All other components within normal limits  CBG MONITORING, ED - Abnormal; Notable for the following components:   Glucose-Capillary 121 (*)    All  other components within normal limits  ETHANOL  C-REACTIVE PROTEIN  URINALYSIS, ROUTINE W REFLEX MICROSCOPIC    EKG EKG Interpretation  Date/Time:  Thursday May 05 2020 13:29:19 EDT Ventricular Rate:  69 PR Interval:    QRS Duration: 94 QT Interval:  377 QTC Calculation: 404 R Axis:   50 Text Interpretation: Sinus rhythm vs afib Atrial premature complexes in couplets Abnormal R-wave progression, early transition Nonspecific T abnrm, anterolateral leads Confirmed by Pricilla LovelessGoldston, Cornel Werber [phone]([id number]) on 05/05/2020 1:31:22 PM   Radiology CT Head Wo Contrast  Result Date: 05/05/2020 CLINICAL DATA:  Delirium EXAM: CT HEAD WITHOUT CONTRAST TECHNIQUE: Contiguous axial images were obtained from the base of the skull through the vertex without intravenous contrast. COMPARISON:  01/19/2020 FINDINGS: Brain: No evidence of acute large vascular territory infarction, acute hemorrhage, hydrocephalus, extra-axial collection or mass lesion/mass effect. Similar patchy white matter hypoattenuation, likely the sequela of chronic microvascular ischemic disease. Similar moderate diffuse cerebral and cerebellar atrophy with ex vacuo ventricular dilation. Vascular: Calcific intracranial atherosclerosis. Skull: Normal. Negative for fracture or focal lesion. Sinuses/Orbits: Mild scattered ethmoid air cell mucosal thickening. Otherwise, the sinuses are clear without air-fluid levels. Negative orbits. Other: No mastoid effusions. IMPRESSION: 1. No acute intracranial abnormality. 2. 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 Electronically Signed   By: Feliberto HartsFrederick S Jones MD   On: 05/05/2020 15:31   CT Chest Wo Contrast  Result Date: 05/05/2020 CLINICAL DATA:  Delirium. EXAM: CT CHEST WITHOUT CONTRAST TECHNIQUE: Multidetector CT imaging of the chest was performed following the standard protocol without IV contrast. COMPARISON:  Dec 24, 2019 FINDINGS: Cardiovascular: The heart size is  enlarged. Main pulmonary artery is dilated measuring 3.3 cm. There are mild atherosclerotic changes of the thoracic aorta without evidence for an aneurysm. Mediastinum/Nodes: -- No mediastinal lymphadenopathy. -- No hilar lymphadenopathy. -- No axillary lymphadenopathy. -- No supraclavicular lymphadenopathy. -- Normal thyroid gland where visualized. -  Unremarkable esophagus. Lungs/Pleura: There is atelectasis at the right lung base. The trachea is unremarkable. There is no significant pleural effusion. Upper Abdomen: Contrast bolus timing is not optimized for evaluation of the abdominal organs. There is cholelithiasis without secondary signs of acute cholecystitis. Musculoskeletal: No chest wall abnormality. No bony spinal canal stenosis. IMPRESSION: 1. Atelectasis in the right lower lobe. 2. Cardiomegaly. 3. Dilated main pulmonary artery which can be seen in patients with elevated pulmonary artery pressures. 4. Cholelithiasis without secondary signs of acute cholecystitis. Aortic Atherosclerosis (ICD10-I70.0). Electronically Signed   By: Katherine Mantlehristopher  Green M.D.   On: 05/05/2020 15:36   DG Chest Portable 1 View  Result Date: 05/05/2020 CLINICAL DATA:  Altered mental status. History of COVID as stage IV decubitus ulcerations. EXAM: PORTABLE CHEST 1 VIEW COMPARISON:  01/22/2020 FINDINGS: Cardiomediastinal contours and hilar structures are normal. RIGHT hemidiaphragm remains elevated. No lobar level consolidative changes. No gross effusion on frontal view with resolution of graded opacity that was seen in the RIGHT chest on the previous study Subtle opacity at the LEFT lung base towards the periphery. No acute skeletal process to the extent evaluated. IMPRESSION: Subtle opacity at the LEFT lung base towards the periphery. No signs of consolidation. Findings may represent  atelectasis or developing infection. Overall the appearance of the chest is much improved compared to January 22, 2020. Electronically Signed   By:  Donzetta Kohut M.D.   On: 05/05/2020 14:34    Procedures Procedures (including critical care time)  Medications Ordered in ED Medications - No data to display  ED Course  I have reviewed the triage vital signs and the nursing notes.  Pertinent labs & imaging results that were available during my care of the patient were reviewed by me and considered in my medical decision making (see chart for details).    MDM Rules/Calculators/A&P                          Patient presents with psychosis.  Seems to be a recurrent problem for her.  Labs have been reviewed and are unremarkable compared to baseline except a mildly elevated sed rate.  However with normal CRP and white blood cells, my suspicion of serious bacterial illness is low and I do not think this sacral wound is infected currently.  Will consult psychiatry. Final Clinical Impression(s) / ED Diagnoses Final diagnoses:  Psychosis, unspecified psychosis type [facility])    Rx / DC Orders ED Discharge Orders    None       Pricilla Loveless, MD 05/05/20 [phone]
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
## 2020-05-05 NOTE — Progress Notes (Signed)
Per Berneice Heinrich, NP patient meets inpatient criteria.  Referral have been made to the following facilities:    [facility]        [facility]        [facility]        [facility]        [facility]       Will continue to follow up on referrals.  Ladoris Gene MSW,LCSWA,LCASA Clinical Social Worker  [place] Disposition, CSW [phone] (cell)

## 2020-05-05 NOTE — Progress Notes (Addendum)
05/05/2020  1355 Labs drawn and sent to main lab. Gold, purple, dark green, light green, blue, red, and gray tubes all sent to main lab.

## 2020-05-06 ENCOUNTER — Other Ambulatory Visit: Payer: Self-pay

## 2020-05-06 LAB — URINALYSIS, ROUTINE W REFLEX MICROSCOPIC
Bilirubin Urine: NEGATIVE
Glucose, UA: NEGATIVE mg/dL
Hgb urine dipstick: NEGATIVE
Ketones, ur: 5 mg/dL — AB
Leukocytes,Ua: NEGATIVE
Nitrite: NEGATIVE
Protein, ur: NEGATIVE mg/dL
Specific Gravity, Urine: 1.016 (ref 1.005–1.030)
pH: 5 (ref 5.0–8.0)

## 2020-05-06 MED ORDER — METFORMIN HCL 500 MG PO TABS
1000.0000 mg | ORAL_TABLET | Freq: Every day | ORAL | Status: DC
  Administered 2020-05-06 – 2020-05-25 (×10): 1000 mg via ORAL
  Filled 2020-05-06 (×18): qty 2

## 2020-05-06 MED ORDER — METFORMIN HCL 500 MG PO TABS
1000.0000 mg | ORAL_TABLET | Freq: Every day | ORAL | Status: DC
  Administered 2020-05-06 – 2020-05-23 (×8): 1000 mg via ORAL
  Filled 2020-05-06 (×10): qty 2

## 2020-05-06 MED ORDER — LINAGLIPTIN 5 MG PO TABS
5.0000 mg | ORAL_TABLET | Freq: Every day | ORAL | Status: DC
  Administered 2020-05-06 – 2020-05-25 (×10): 5 mg via ORAL
  Filled 2020-05-06 (×24): qty 1

## 2020-05-06 NOTE — Progress Notes (Signed)
CSW received a call from [facility] stating pt is not appropriate for placement there due to her "medical conditions".  Please reconsult if future social work needs arise.  CSW signing off, as social work intervention is no longer needed.  Dorothe Pea. Honor Fairbank  MSW, LCSW, LCAS, CCS Transitions of Care Clinical Social Worker Care Coordination Department Ph: [phone]

## 2020-05-06 NOTE — ED Notes (Signed)
Patient received lunch tray/ Patient refused to eat

## 2020-05-06 NOTE — BH Assessment (Addendum)
[facility]HH Assessment Progress Note  Per Berneice Heinrich, NP, this pt requires psychiatric hospitalization at this time.  Pt presents voluntarily, but it has been determined that pt will need to be placed under IVC, which EDP Linwood Dibbles, MD is in the process of initiating.  The following facilities have been contacted to seek placement for this pt, with results as noted:  Beds available, information sent, decision pending: [facility] Mannie Stabile [facility] Lanae Boast  Unable to reach: Earlene Plater (left message at 13:55) [facility] (left message at 13:56) Lucita Ferrara (left message at 14:04) [facility] (left message at 14:11)  At capacity: [facility] (no female) [facility] (under [employer]) [place] (unit currently closed) [facility] (unit currently closed)   Doylene Canning, [place] Behavioral Health Coordinator [phone]   Addendum:  Dr Lynelle Doctor has completed IVC documents, which have been faxed to [facility].  At 15:08 Hortense Ramal confirms receipt.  He has since faxed Findings and Custody Order to this Clinical research associate.  At 15:27 I called [employer] and spoke to [employer], who took demographic information, agreeing to dispatch law enforcement to fill out Return of Service.  Law enforcement then presented at [facility], completing Return of Service, after which IVC documents were faxed to [facility].  Final disposition is pending as of this writing.  Doylene Canning, [place] Behavioral Health Coordinator [phone]

## 2020-05-06 NOTE — ED Notes (Signed)
Patient received breakfast tray/ Patient refused to eat

## 2020-05-06 NOTE — Consult Note (Signed)
[facility] Psych ED Progress Note  05/06/2020 10:32 AM Becky Hendricks  MRN:  [id number] Subjective: Patient states "I am in the hospital because I thought I saw weird stuff in the nursing facility."  Patient assessed by nurse practitioner.  Patient alert and oriented to person place and situation, answers appropriately.  Patient cooperative during assessment.  Patient reports possible delusions states "I was getting changed just in case I was going to have a baby eventually."  Patient verbalizes paranoid ideations states "I was using birth control but something was not right and a metal curette was removed."  Patient mildly confused at times, particularly regarding staff members.  Patient denies suicidal or homicidal ideations.  Patient denies auditory visual hallucinations.  Patient offered support encouragement.  Discussed treatment plan including inpatient psychiatric treatment, patient demonstrates understanding.  Principal Problem: Bipolar disorder (HCC) Diagnosis:  Principal Problem:   Bipolar disorder (HCC)  Total Time spent with patient: 30 minutes  Past Psychiatric History: Bipolar disorder  Past Medical History:  Past Medical History:  Diagnosis Date   Atrial fibrillation (HCC)    Bipolar disorder (HCC)    Bipolar disorder (HCC) 04/27/2020   Clostridium difficile diarrhea 08/02/2019   Dehydration    Essential hypertension    Fall 02/11/2020   Morbid obesity (HCC)    Type 2 diabetes mellitus (HCC)    Weakness     Past Surgical History:  Procedure Laterality Date   INCISION AND DRAINAGE PERIRECTAL ABSCESS Left 12/04/2019   Procedure: IRRIGATION AND DEBRIDEMENT LEFT BUTTOCK ABSCESS;  Surgeon: Violeta Gelinas, MD;  Location: [facility] OR;  Service: General;  Laterality: Left;   INCISION AND DRAINAGE PERIRECTAL ABSCESS Left 12/10/2019   Procedure: REPEAT IRRIGATION AND DEBRIDEMENT BUTTOCK  ABSCESS;  Surgeon: Abigail Miyamoto, MD;  Location: [facility] OR;  Service: General;   Laterality: Left;   Family History:  Family History  Family history unknown: Yes   Family Psychiatric  History: None reported Social History:  Social History   Substance and Sexual Activity  Alcohol Use Not Currently     Social History   Substance and Sexual Activity  Drug Use Not Currently    Social History   Socioeconomic History   Marital status: Single    Spouse name: Not on file   Number of children: Not on file   Years of education: Not on file   Highest education level: Not on file  Occupational History   Not on file  Tobacco Use   Smoking status: Never Smoker   Smokeless tobacco: Never Used  Vaping Use   Vaping Use: Unknown  Substance and Sexual Activity   Alcohol use: Not Currently   Drug use: Not Currently   Sexual activity: Not Currently    Birth control/protection: Post-menopausal  Other Topics Concern   Not on file  Social History Narrative   Not on file   Social Determinants of Health   Financial Resource Strain:    Difficulty of Paying Living Expenses: Not on file  Food Insecurity:    Worried About Running Out of Food in the Last Year: Not on file   [employer] of Food in the Last Year: Not on file  Transportation Needs:    Lack of Transportation (Medical): Not on file   Lack of Transportation (Non-Medical): Not on file  Physical Activity:    Days of Exercise per Week: Not on file   Minutes of Exercise per Session: Not on file  Stress:    Feeling of Stress : Not on  file  Social Connections:    Frequency of Communication with Friends and Family: Not on file   Frequency of Social Gatherings with Friends and Family: Not on file   Attends Religious Services: Not on Scientist, clinical (histocompatibility and immunogenetics) or Organizations: Not on file   Attends Banker Meetings: Not on file   Marital Status: Not on file    Sleep: Fair  Appetite:  Fair  Current Medications: Current Facility-Administered Medications  Medication  Dose Route Frequency Provider Last Rate Last Admin   acetaminophen (TYLENOL) tablet 650 mg  650 mg Oral Q6H PRN Pricilla Loveless, MD       apixaban (ELIQUIS) tablet 5 mg  5 mg Oral BID Pricilla Loveless, MD   5 mg at 05/06/20 1011   ascorbic acid (VITAMIN C) tablet 500 mg  500 mg Oral BID Pricilla Loveless, MD   500 mg at 05/06/20 0919   atorvastatin (LIPITOR) tablet 10 mg  10 mg Oral QHS Pricilla Loveless, MD   10 mg at 05/05/20 2337   calcium carbonate (TUMS - dosed in mg elemental calcium) chewable tablet 200 mg of elemental calcium  1 tablet Oral Daily Pricilla Loveless, MD   200 mg of elemental calcium at 05/06/20 0921   carvedilol (COREG) tablet 12.5 mg  12.5 mg Oral BID WC Pricilla Loveless, MD   12.5 mg at 05/06/20 0915   diphenhydrAMINE-zinc acetate (BENADRYL) 2-0.1 % cream 1 application  1 application Topical TID PRN Pricilla Loveless, MD       divalproex (DEPAKOTE) DR tablet 500 mg  500 mg Oral BID Pricilla Loveless, MD   500 mg at 05/06/20 1610   docusate sodium (COLACE) capsule 100 mg  100 mg Oral BID PRN Pricilla Loveless, MD       famotidine (PEPCID) tablet 20 mg  20 mg Oral BID Pricilla Loveless, MD   20 mg at 05/06/20 0915   feeding supplement (PRO-STAT SUGAR FREE 64) liquid 30 mL  30 mL Oral BID Pricilla Loveless, MD   30 mL at 05/06/20 1011   furosemide (LASIX) tablet 40 mg  40 mg Oral Daily Pricilla Loveless, MD   40 mg at 05/06/20 0915   hydrALAZINE (APRESOLINE) tablet 25 mg  25 mg Oral Q8H Pricilla Loveless, MD   25 mg at 05/06/20 0603   insulin glargine (LANTUS) injection 10 Units  10 Units Subcutaneous QHS Pricilla Loveless, MD       Ipratropium-Albuterol (COMBIVENT) respimat 1 puff  1 puff Inhalation Q6H Pricilla Loveless, MD   1 puff at 05/06/20 0923   linagliptin (TRADJENTA) tablet 5 mg  5 mg Oral Q breakfast Pricilla Loveless, MD   5 mg at 05/06/20 9604   And   metFORMIN (GLUCOPHAGE) tablet 1,000 mg  1,000 mg Oral Q breakfast Pricilla Loveless, MD   1,000 mg at 05/06/20 5409    losartan (COZAAR) tablet 50 mg  50 mg Oral Daily Pricilla Loveless, MD   50 mg at 05/06/20 1013   metFORMIN (GLUCOPHAGE) tablet 1,000 mg  1,000 mg Oral Q supper Pricilla Loveless, MD       multivitamin with minerals tablet 1 tablet  1 tablet Oral Daily Pricilla Loveless, MD   1 tablet at 05/06/20 0920   nutrition supplement (JUVEN) (JUVEN) powder packet 1 packet  1 packet Oral BID BM Pricilla Loveless, MD   1 packet at 05/06/20 1011   pantoprazole (PROTONIX) EC tablet 40 mg  40 mg Oral BID Pricilla Loveless, MD   40  mg at 05/06/20 0919   protein supplement (ENSURE MAX) liquid  11 oz Oral BID Pricilla Loveless, MD       senna-docusate (Senokot-S) tablet 1 tablet  1 tablet Oral Daily Pricilla Loveless, MD   1 tablet at 05/06/20 0932   zinc sulfate capsule 220 mg  220 mg Oral Daily Pricilla Loveless, MD   220 mg at 05/06/20 3557   Current Outpatient Medications  Medication Sig Dispense Refill   Amino Acids-Protein Hydrolys (FEEDING SUPPLEMENT, PRO-STAT SUGAR FREE 64,) LIQD Take 30 mLs by mouth 2 (two) times daily. 887 mL 0   apixaban (ELIQUIS) 5 MG TABS tablet Take 5 mg by mouth 2 (two) times daily.     ascorbic acid (VITAMIN C) 500 MG tablet Take 500 mg by mouth daily.      atorvastatin (LIPITOR) 10 MG tablet Take 10 mg by mouth at bedtime.     calcium carbonate (TUMS - DOSED IN MG ELEMENTAL CALCIUM) 500 MG chewable tablet Chew 1 tablet by mouth daily.     carvedilol (COREG) 25 MG tablet Take 12.5 mg by mouth 2 (two) times daily with a meal.     Cholecalciferol (VITAMIN D) 125 MCG (5000 UT) CAPS Take 10,000 Units by mouth once a week. Wednesday     divalproex (DEPAKOTE SPRINKLE) 125 MG capsule Take 125 mg by mouth daily. Every afternoon.     divalproex (DEPAKOTE) 500 MG DR tablet Take 500 mg by mouth 2 (two) times daily.     Emollient (EUCERIN INTENSIVE REPAIR) LOTN Apply 1 application topically daily. To both knees for rash.     furosemide (LASIX) 40 MG tablet Take 40 mg by mouth daily.       hydrALAZINE (APRESOLINE) 25 MG tablet Take 1 tablet (25 mg total) by mouth every 8 (eight) hours.     hydrocortisone cream 1 % Apply topically 2 (two) times daily. (Patient taking differently: Apply 1 application topically 2 (two) times daily. ) 30 g 0   insulin glargine (LANTUS) 100 UNIT/ML injection Inject 10 Units into the skin at bedtime. Notify PEC for CBG less than 100 or greater than 250.     Ipratropium-Albuterol (COMBIVENT RESPIMAT) 20-100 MCG/ACT AERS respimat Inhale 1 puff into the lungs every 6 (six) hours.     LORazepam (ATIVAN) 0.5 MG tablet Take 0.5 mg by mouth 2 (two) times daily as needed for anxiety.     losartan (COZAAR) 25 MG tablet Take 50 mg by mouth daily.     Multiple Vitamin (MULTIVITAMIN WITH MINERALS) TABS tablet Take 1 tablet by mouth daily.     NON FORMULARY Take 120 mLs by mouth daily. Medpass to promote healing     nutrition supplement, JUVEN, (JUVEN) PACK Take 1 packet by mouth 2 (two) times daily between meals.  0   pantoprazole (PROTONIX) 40 MG tablet Take 1 tablet (40 mg total) by mouth 2 (two) times daily. (Patient taking differently: Take 40 mg by mouth daily. ) 30 tablet 0   sennosides-docusate sodium (SENOKOT-S) 8.6-50 MG tablet Take 1 tablet by mouth daily.     sitaGLIPtin-metformin (JANUMET) 50-1000 MG tablet Take 1 tablet by mouth 2 (two) times daily with a meal.     zinc sulfate 220 (50 Zn) MG capsule Take 220 mg by mouth daily.     acetaminophen (TYLENOL) 325 MG tablet Take 650 mg by mouth every 6 (six) hours as needed for mild pain or headache.     diphenhydrAMINE (BENADRYL) 2 % cream Apply 1  application topically 3 (three) times daily as needed for itching.     docusate sodium (COLACE) 100 MG capsule Take 1 capsule (100 mg total) by mouth 2 (two) times daily as needed for mild constipation. 10 capsule 0   Ensure Max Protein (ENSURE MAX PROTEIN) LIQD Take 330 mLs (11 oz total) by mouth 2 (two) times daily. (Patient not taking: Reported on  05/05/2020)      Lab Results:  Results for orders placed or performed during the hospital encounter of 05/05/20 (from the past 48 hour(s))  CBG monitoring, ED     Status: Abnormal   Collection Time: 05/05/20  1:34 PM  Result Value Ref Range   Glucose-Capillary 121 (H) 70 - 99 mg/dL    Comment: Glucose reference range applies only to samples taken after fasting for at least 8 hours.  Comprehensive metabolic panel     Status: Abnormal   Collection Time: 05/05/20  1:55 PM  Result Value Ref Range   Sodium 145 135 - 145 mmol/L   Potassium 4.1 3.5 - 5.1 mmol/L   Chloride 99 98 - 111 mmol/L   CO2 33 (H) 22 - 32 mmol/L   Glucose, Bld 120 (H) 70 - 99 mg/dL    Comment: Glucose reference range applies only to samples taken after fasting for at least 8 hours.   BUN 25 (H) 8 - 23 mg/dL   Creatinine, Ser 5.39 0.44 - 1.00 mg/dL   Calcium 9.5 8.9 - 76.7 mg/dL   Total Protein 7.2 6.5 - 8.1 g/dL   Albumin 3.4 (L) 3.5 - 5.0 g/dL   AST 14 (L) 15 - 41 U/L   ALT 13 0 - 44 U/L   Alkaline Phosphatase 37 (L) 38 - 126 U/L   Total Bilirubin 0.4 0.3 - 1.2 mg/dL   GFR calc non Af Amer >60 >60 mL/min   GFR calc Af Amer >60 >60 mL/min   Anion gap 13 5 - 15    Comment: Performed at [facility], [address], [place], [place] [address]  Ethanol     Status: None   Collection Time: 05/05/20  1:55 PM  Result Value Ref Range   Alcohol, Ethyl (B) <10 <10 mg/dL    Comment: (NOTE) Lowest detectable limit for serum alcohol is 10 mg/dL.  For medical purposes only. Performed at [facility], [address], [place], [place] [address]   CBC with Differential     Status: Abnormal   Collection Time: 05/05/20  1:55 PM  Result Value Ref Range   WBC 9.7 4.0 - 10.5 K/uL   RBC 3.97 3.87 - 5.11 MIL/uL   Hemoglobin 11.3 (L) 12.0 - 15.0 g/dL   HCT 09.7 36 - 46 %   MCV 91.7 80.0 - 100.0 fL   MCH 28.5 26.0 - 34.0 pg   MCHC 31.0 30.0 - 36.0 g/dL   RDW 35.3 29.9 - 24.2 %    Platelets 204 150 - 400 K/uL   nRBC 0.0 0.0 - 0.2 %   Neutrophils Relative % 57 %   Neutro Abs 5.5 1.7 - 7.7 K/uL   Lymphocytes Relative 29 %   Lymphs Abs 2.8 0.7 - 4.0 K/uL   Monocytes Relative 12 %   Monocytes Absolute 1.1 (H) 0 - 1 K/uL   Eosinophils Relative 2 %   Eosinophils Absolute 0.2 0 - 0 K/uL   Basophils Relative 0 %   Basophils Absolute 0.0 0 - 0 K/uL   Immature  Granulocytes 0 %   Abs Immature Granulocytes 0.04 0.00 - 0.07 K/uL    Comment: Performed at [facility], [address] [place], [place] [address]  Valproic acid level     Status: Abnormal   Collection Time: 05/05/20  1:55 PM  Result Value Ref Range   Valproic Acid Lvl 32 (L) 50.0 - 100.0 ug/mL    Comment: Performed at [facility], [address] [place], [place] [address]  Sedimentation rate     Status: Abnormal   Collection Time: 05/05/20  1:55 PM  Result Value Ref Range   Sed Rate 33 (H) 0 - 22 mm/hr    Comment: Performed at [facility], [address] [place], [place] [address]  C-reactive protein     Status: None   Collection Time: 05/05/20  1:55 PM  Result Value Ref Range   CRP 0.7 <1.0 mg/dL    Comment: Performed at [facility], [address] [place], [place] [address]  SARS Coronavirus 2 by RT PCR (hospital order, performed in [facility] hospital lab) Nasopharyngeal Nasopharyngeal Swab     Status: None   Collection Time: 05/05/20  6:34 PM   Specimen: Nasopharyngeal Swab  Result Value Ref Range   SARS Coronavirus 2 NEGATIVE NEGATIVE    Comment: (NOTE) SARS-CoV-2 target nucleic acids are NOT DETECTED.  The SARS-CoV-2 RNA is generally detectable in upper and lower respiratory specimens during the acute phase of infection. The lowest concentration of SARS-CoV-2 viral copies this assay can detect is 250 copies / mL. A negative result does not preclude SARS-CoV-2 infection and should not be used as the sole basis for  treatment or other patient management decisions.  A negative result may occur with improper specimen collection / handling, submission of specimen other than nasopharyngeal swab, presence of viral mutation(s) within the areas targeted by this assay, and inadequate number of viral copies (<250 copies / mL). A negative result must be combined with clinical observations, patient history, and epidemiological information.  Fact Sheet for Patients:   BoilerBrush.com.cyhttps://www.fda.gov/media/136312/download  Fact Sheet for Healthcare Providers: https://pope.com/https://www.fda.gov/media/136313/download  This test is not yet approved or  cleared by the [place] FDA and has been authorized for detection and/or diagnosis of SARS-CoV-2 by FDA under an Emergency Use Authorization (EUA).  This EUA will remain in effect (meaning this test can be used) for the duration of the COVID-19 declaration under Section 564(b)(1) of the Act, 21 U.S.C. section 360bbb-3(b)(1), unless the authorization is terminated or revoked sooner.  Performed at [facility], [address] [place], [place] [address]     Blood Alcohol level:  Lab Results  Component Value Date   ETH <10 05/05/2020   ETH <10 02/01/2020    Physical Findings: AIMS:  , ,  ,  ,    CIWA:    COWS:     Musculoskeletal: Strength & Muscle Tone: within normal limits Gait & Station: Unable to assess Patient leans: N/A  Psychiatric Specialty Exam: Physical Exam Vitals and nursing note reviewed.  Constitutional:      Appearance: She is well-developed.  HENT:     Head: Normocephalic.  Cardiovascular:     Rate and Rhythm: Normal rate.  Pulmonary:     Effort: Pulmonary effort is normal.  Neurological:     Mental Status: She is alert and oriented to person, place, and time.  Psychiatric:        Attention and Perception: Attention normal.  Mood and Affect: Mood normal.        Speech: Speech is tangential.        Behavior: Behavior is  cooperative.        Thought Content: Thought content is paranoid and delusional.        Cognition and Memory: Memory normal.     Review of Systems  Constitutional: Negative.   HENT: Negative.   Eyes: Negative.   Respiratory: Negative.   Cardiovascular: Negative.   Gastrointestinal: Negative.   Genitourinary: Negative.   Musculoskeletal: Negative.   Skin: Negative.   Neurological: Negative.   Psychiatric/Behavioral: Positive for confusion.    Blood pressure (!) 142/82, pulse 87, temperature 98.4 F (36.9 C), temperature source Oral, resp. rate 16, height  (1.676 m), weight 92.5 kg, SpO2 95 %.Body mass index is 32.91 kg/m.  General Appearance: Casual and Fairly Groomed  Eye Contact:  Fair  Speech:  Clear and Coherent  Volume:  Normal  Mood:  Euthymic  Affect:  Congruent  Thought Process:  Disorganized  Orientation:  Full (Time, Place, and Person)  Thought Content:  Delusions and Paranoid Ideation  Suicidal Thoughts:  No  Homicidal Thoughts:  No  Memory:  Immediate;   Fair Recent;   Fair Remote;   Fair  Judgement:  Impaired  Insight:  Lacking  Psychomotor Activity:  Normal  Concentration:  Concentration: Fair and Attention Span: Fair  Recall:  [employer] of Knowledge:  Fair  Language:  Fair  Akathisia:  No  Handed:  Right  AIMS (if indicated):     Assets:  Communication Skills Desire for Improvement Financial Resources/Insurance Housing  ADL's: Unable to assess  Cognition:  WNL  Sleep:         Treatment Plan Summary: Plan Inpatient geriatric psychiatric treatment   Patient reviewed with Dr. Nelly Rout.  Patrcia Dolly, FNP 05/06/2020, 10:32 AM

## 2020-05-06 NOTE — ED Provider Notes (Signed)
Emergency Medicine Observation Re-evaluation Note  Becky Hendricks is a 74 y.o. female, seen on rounds today.  Pt initially presented to the ED for complaints of Psychiatric Evaluation Currently, the patient is awaiting transfer for inpatient geriatric psychiatry.  Physical Exam  BP (!) 169/91    Pulse 84    Temp 98.4 F (36.9 C) (Oral)    Resp 14    Ht 1.676 m (5\' 6" )    Wt 92.5 kg    LMP  (LMP Unknown)    SpO2 94%    BMI 32.91 kg/m  Physical Exam General: Awake, laughing, conversant Cardiac: Regular rate and rhythm Lungs: Clear to auscultation Psych: Still appears confused, laughing inappropriately, nonsensical statements  ED Course / MDM  EKG:EKG Interpretation  Date/Time:  Thursday May 05 2020 13:29:19 EDT Ventricular Rate:  69 PR Interval:    QRS Duration: 94 QT Interval:  377 QTC Calculation: 404 R Axis:   50 Text Interpretation: Sinus rhythm vs afib Atrial premature complexes in couplets Abnormal R-wave progression, early transition Nonspecific T abnrm, anterolateral leads Confirmed by 10-09-1996 [phone]) on 05/05/2020 1:31:22 PM    I have reviewed the labs performed to date as well as medications administered while in observation.  Recent changes in the last 24 hours include patient had her initial ED evaluation psychiatric evaluation.  Plan has been for inpatient psychiatric treatment.  No acute issues overnight patient is hypertensive but she does have her meds ordered.  We will continue to monitor..  Plan  Current plan is for inpatient psychiatric treatment.     05/07/2020, MD 05/06/20 [phone]

## 2020-05-06 NOTE — ED Notes (Addendum)
Urine collection obtained. Pt continuing to talk in random sentences. Pt is not violent. Pt is two person assist.

## 2020-05-07 LAB — CBG MONITORING, ED: Glucose-Capillary: 110 mg/dL — ABNORMAL HIGH (ref 70–99)

## 2020-05-07 NOTE — ED Notes (Signed)
Pt refused all medication except Lasix and Coreg. Pt agreed to take two medications at this time for blood pressure and to fluid. Will attempt to give other medications at a different time. Purewick replaced and peri care provided. Pt are all of breakfast and denies any needs at this time.

## 2020-05-07 NOTE — ED Notes (Signed)
Patient is refusing medication. Pilar Plate, MD aware and talked with patient. Continuing to monitor patient and sitter at bedside. Offered snack and drink but patient refused both.

## 2020-05-07 NOTE — BH Assessment (Signed)
[place] Assessment Progress Note This Probation officer met with patient this date to evaluate current mental health status. Patient continues to be disorganized and confused. Patient was noted to refer to this writer as "A surgeon" that might just "cut her apart." Patient is difficult to redirect and is tangential elaborating on how she has multiple infections throughout her body that is "due to the metal." Patient will not respond as this writer attempts to ask questions associated with a MSE. Per collateral from nurse patient has been refusing medications and continues to be very disorganized. Case was staffed with Bobby Rumpf NP who recommended a continued inpatient admission.

## 2020-05-08 MED ORDER — IPRATROPIUM-ALBUTEROL 20-100 MCG/ACT IN AERS
1.0000 | INHALATION_SPRAY | Freq: Four times a day (QID) | RESPIRATORY_TRACT | Status: DC | PRN
  Filled 2020-05-08: qty 4

## 2020-05-08 NOTE — Progress Notes (Signed)
Patient continues to meet criteria for inpatient Gero-psych treatment per Ophelia Shoulder NP. CSW re-faxed referrals to the following facilities for review:  [facility]  TTS will continue to seek bed placement.   Trula Slade, MSW, LCSW Clinical Social Worker 05/08/2020 12:47 PM

## 2020-05-08 NOTE — ED Notes (Addendum)
Patient refused all medications. She states, "I do not want to take my medications, I just want to send the message without the delay"

## 2020-05-09 DIAGNOSIS — F3181 Bipolar II disorder: Secondary | ICD-10-CM

## 2020-05-09 MED ORDER — DIVALPROEX SODIUM 125 MG PO CSDR
250.0000 mg | DELAYED_RELEASE_CAPSULE | Freq: Three times a day (TID) | ORAL | Status: DC
  Administered 2020-05-11 – 2020-05-25 (×23): 250 mg via ORAL
  Filled 2020-05-09 (×50): qty 2

## 2020-05-09 MED ORDER — ZIPRASIDONE MESYLATE 20 MG IM SOLR
20.0000 mg | Freq: Once | INTRAMUSCULAR | Status: AC
  Administered 2020-05-09: 20 mg via INTRAMUSCULAR
  Filled 2020-05-09: qty 20

## 2020-05-09 MED ORDER — OLANZAPINE 5 MG PO TBDP: 5.0000 mg | ORAL_TABLET | Freq: Every day | ORAL | Status: DC

## 2020-05-09 MED ORDER — RISPERIDONE 1 MG PO TBDP
1.0000 mg | ORAL_TABLET | Freq: Every day | ORAL | Status: DC
  Filled 2020-05-09: qty 1

## 2020-05-09 MED ORDER — BENZTROPINE MESYLATE 0.5 MG PO TABS
0.5000 mg | ORAL_TABLET | Freq: Every day | ORAL | Status: DC
  Filled 2020-05-09: qty 1

## 2020-05-09 MED ORDER — STERILE WATER FOR INJECTION IJ SOLN
INTRAMUSCULAR | Status: AC
  Administered 2020-05-09: 2.1 mL
  Filled 2020-05-09: qty 10

## 2020-05-09 NOTE — Consult Note (Addendum)
WOC Nurse Consult Note: Reason for Consult: Consult requested for sacrum wound.  Pt is familiar to [facility] team from previous admission, refer to progress note on 6/9.  It is difficult to keep wound from becoming soiled since pt is frequently incontinent of large ant urine.  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  Dressing procedure/placement/frequency: Topical treatment orders provided for bedside nurses to perform as follows to promote healing: Pack sacrum wound with Alginate Q day [place] # [phone]) then cover with foam dressing.  (Change foam dressing Q 3 days or PRN soiling.) Please re-consult if further assistance is needed.  Thank-you,  Cammie Mcgee MSN, RN, CWOCN, [place], CNS [phone]

## 2020-05-09 NOTE — ED Provider Notes (Signed)
Emergency Medicine Observation Re-evaluation Note  Becky Hendricks is a 74 y.o. female, seen on rounds today.  Pt initially presented to the ED for complaints of Psychiatric Evaluation Currently, the patient is being noncompliant with taking medications.  Physical Exam  BP (!) 177/97 (BP Location: Left Arm)   Pulse 73   Temp 98.1 F (36.7 C) (Oral)   Resp 18   Ht 1.676 m (5\' 6" )   Wt 92.5 kg   LMP  (LMP Unknown)   SpO2 93%   BMI 32.91 kg/m  Physical Exam General: Sitting in bed and eating Cardiac: Regular Lungs: Clear Psych: Uncooperative  ED Course / MDM  EKG:EKG Interpretation  Date/Time:  Thursday May 05 2020 13:29:19 EDT Ventricular Rate:  69 PR Interval:    QRS Duration: 94 QT Interval:  377 QTC Calculation: 404 R Axis:   50 Text Interpretation: Sinus rhythm vs afib Atrial premature complexes in couplets Abnormal R-wave progression, early transition Nonspecific T abnrm, anterolateral leads Confirmed by 10-09-1996 [phone]) on 05/05/2020 1:31:22 PM    I have reviewed the labs performed to date as well as medications administered while in observation.  Recent changes in the last 24 hours include continues to not want to take her medications..  Plan  Current plan is for patient reassessed by psychiatry.  May require forced medications. Patient is under full IVC at this time.   05/07/2020, MD 05/09/20 [phone]

## 2020-05-09 NOTE — BH Assessment (Addendum)
[facility] Assessment Progress Note  Per Hillery Jacks, NP, this involuntary pt continues to require psychiatric hospitalization at this time.  The following facilities have been contacted to seek placement for this pt, with results as noted:  Beds available, information sent, decision pending: [facility]  Unable to reach: Earlene Plater (left message at 15:34)  At capacity: [facility] (unit currently closed) [facility] (unit currently closed) [facility] (geriatric unit currently under quarantine)    Doylene Canning, [place] Behavioral Health Coordinator [phone]

## 2020-05-09 NOTE — Consult Note (Signed)
Telepsych Consultation   Reason for Consult:  Psyhosis Referring Physician:  EPD Location of Patient: [id number] Location of Provider: [facility]  Patient Identification: Becky Hendricks Gal MRN:  [id number] Principal Diagnosis: Bipolar disorder [facility](HCC) Diagnosis:  Principal Problem:   Bipolar disorder (HCC)   Total Time spent with patient: 15 minutes  Subjective:   Becky Hendricks Laban is a 74 y.o. female seen and evaluated by nurse practitioner.  She continues to present disorganized, delusional and tangential.  Patient observed responding to internal stimuli and talking to herself.  Chart review patient continues to refuse medications.  CSW seeking for higher level care.  Case staffed with attending psychiatrist Lucianne MussKumar and treatment team.  Support, encouragement and  reassurance was provided.  HPI:  Per EDP Report: 74 year old female presents from her nursing facility with altered mental status. The provider sent her out because of psychosis, unclear if it is her psychiatric disease versus infection. A urinalysis was reportedly negative. No fevers. History is primarily taken from Gae DryPaulita Bowen,who is the Publishing copynursing director. Thinks she has maybe had some recent medication changes but is not sure what. Has a longstanding psychiatric history.  The patient talks to me like I have seen her often before, which to my knowledge I have not. She also talks about how she needs to go to the maternity ward as she is pregnant and has been pregnant since 1965.  Past Psychiatric History:   Risk to Self: Suicidal Ideation: No Suicidal Intent: No Is patient at risk for suicide?: No Suicidal Plan?: No Access to Means: No What has been your use of drugs/alcohol within the last 12 months?: none How many times?: 0 Other Self Harm Risks: dementia Triggers for Past Attempts: None known Intentional Self Injurious Behavior: None Risk to Others: Homicidal Ideation: No Thoughts of Harm to Others:  No Current Homicidal Intent: No-Not Currently/Within Last 6 Months Current Homicidal Plan: No Access to Homicidal Means: No Identified Victim: none History of harm to others?: No Assessment of Violence: None Noted Violent Behavior Description: none reported Does patient have access to weapons?: No Criminal Charges Pending?: No Does patient have a court date: No Prior Inpatient Therapy: Prior Inpatient Therapy:  (UTA) Prior Outpatient Therapy: Prior Outpatient Therapy:  (UTA)  Past Medical History:  Past Medical History:  Diagnosis Date  . Atrial fibrillation (HCC)   . Bipolar disorder (HCC)   . Bipolar disorder (HCC) 04/27/2020  . Clostridium difficile diarrhea 08/02/2019  . Dehydration   . Essential hypertension   . Fall 02/11/2020  . Morbid obesity (HCC)   . Type 2 diabetes mellitus (HCC)   . Weakness     Past Surgical History:  Procedure Laterality Date  . INCISION AND DRAINAGE PERIRECTAL ABSCESS Left 12/04/2019   Procedure: IRRIGATION AND DEBRIDEMENT LEFT BUTTOCK ABSCESS;  Surgeon: Violeta Gelinashompson, Burke, MD;  [facility];  Service: General;  Laterality: Left;  . INCISION AND DRAINAGE PERIRECTAL ABSCESS Left 12/10/2019   Procedure: REPEAT IRRIGATION AND DEBRIDEMENT BUTTOCK  ABSCESS;  Surgeon: Abigail MiyamotoBlackman, Douglas, MD;  Location: [facility] OR;  Service: General;  Laterality: Left;   Family History:  Family History  Family history unknown: Yes   Family Psychiatric  History:  Social History:  Social History   Substance and Sexual Activity  Alcohol Use Not Currently     Social History   Substance and Sexual Activity  Drug Use Not Currently    Social History   Socioeconomic History  . Marital status: Single    Spouse name: Not on file  .  Number of children: Not on file  . Years of education: Not on file  . Highest education level: Not on file  Occupational History  . Not on file  Tobacco Use  . Smoking status: Never Smoker  . Smokeless tobacco: Never Used  Vaping Use  .  Vaping Use: Unknown  Substance and Sexual Activity  . Alcohol use: Not Currently  . Drug use: Not Currently  . Sexual activity: Not Currently    Birth control/protection: Post-menopausal  Other Topics Concern  . Not on file  Social History Narrative  . Not on file   Social Determinants of Health   Financial Resource Strain:   . Difficulty of Paying Living Expenses: Not on file  Food Insecurity:   . Worried About Programme researcher, broadcasting/film/video in the Last Year: Not on file  . Ran Out of Food in the Last Year: Not on file  Transportation Needs:   . Lack of Transportation (Medical): Not on file  . Lack of Transportation (Non-Medical): Not on file  Physical Activity:   . Days of Exercise per Week: Not on file  . Minutes of Exercise per Session: Not on file  Stress:   . Feeling of Stress : Not on file  Social Connections:   . Frequency of Communication with Friends and Family: Not on file  . Frequency of Social Gatherings with Friends and Family: Not on file  . Attends Religious Services: Not on file  . Active Member of Clubs or Organizations: Not on file  . Attends Banker Meetings: Not on file  . Marital Status: Not on file   Additional Social History:    Allergies:   Allergies  Allergen Reactions  . Chlorhexidine Gluconate Itching  . Acetazolamide Er Rash    Labs: No results found for this or any previous visit (from the past 48 hour(s)).  Medications:  Current Facility-Administered Medications  Medication Dose Route Frequency Provider Last Rate Last Admin  . acetaminophen (TYLENOL) tablet 650 mg  650 mg Oral Q6H PRN Pricilla Loveless, MD      . apixaban (ELIQUIS) tablet 5 mg  5 mg Oral BID Pricilla Loveless, MD   5 mg at 05/06/20 1011  . ascorbic acid (VITAMIN C) tablet 500 mg  500 mg Oral BID Pricilla Loveless, MD   500 mg at 05/06/20 0919  . atorvastatin (LIPITOR) tablet 10 mg  10 mg Oral QHS Pricilla Loveless, MD   10 mg at 05/05/20 2337  . calcium carbonate (TUMS -  dosed in mg elemental calcium) chewable tablet 200 mg of elemental calcium  1 tablet Oral Daily Pricilla Loveless, MD   200 mg of elemental calcium at 05/06/20 0921  . carvedilol (COREG) tablet 12.5 mg  12.5 mg Oral BID WC Pricilla Loveless, MD   12.5 mg at 05/07/20 1002  . diphenhydrAMINE-zinc acetate (BENADRYL) 2-0.1 % cream 1 application  1 application Topical TID PRN Pricilla Loveless, MD      . divalproex (DEPAKOTE) DR tablet 500 mg  500 mg Oral BID Pricilla Loveless, MD   500 mg at 05/06/20 0917  . docusate sodium (COLACE) capsule 100 mg  100 mg Oral BID PRN Pricilla Loveless, MD      . famotidine (PEPCID) tablet 20 mg  20 mg Oral BID Pricilla Loveless, MD   20 mg at 05/06/20 0915  . feeding supplement (PRO-STAT SUGAR FREE 64) liquid 30 mL  30 mL Oral BID Pricilla Loveless, MD   30 mL at 05/06/20 1011  .  furosemide (LASIX) tablet 40 mg  40 mg Oral Daily Pricilla Loveless, MD   40 mg at 05/07/20 1002  . hydrALAZINE (APRESOLINE) tablet 25 mg  25 mg Oral Q8H Pricilla Loveless, MD   25 mg at 05/06/20 1351  . insulin glargine (LANTUS) injection 10 Units  10 Units Subcutaneous QHS Pricilla Loveless, MD      . Ipratropium-Albuterol (COMBIVENT) respimat 1 puff  1 puff Inhalation Q6H PRN Lorre Nick, MD      . linagliptin (TRADJENTA) tablet 5 mg  5 mg Oral Q breakfast Pricilla Loveless, MD   5 mg at 05/06/20 5176   And  . metFORMIN (GLUCOPHAGE) tablet 1,000 mg  1,000 mg Oral Q breakfast Pricilla Loveless, MD   1,000 mg at 05/06/20 0918  . losartan (COZAAR) tablet 50 mg  50 mg Oral Daily Pricilla Loveless, MD   50 mg at 05/06/20 1013  . metFORMIN (GLUCOPHAGE) tablet 1,000 mg  1,000 mg Oral Q supper Pricilla Loveless, MD   1,000 mg at 05/06/20 1717  . multivitamin with minerals tablet 1 tablet  1 tablet Oral Daily Pricilla Loveless, MD   1 tablet at 05/06/20 0920  . nutrition supplement (JUVEN) (JUVEN) powder packet 1 packet  1 packet Oral BID BM Pricilla Loveless, MD   1 packet at 05/06/20 1350  . pantoprazole (PROTONIX) EC tablet  40 mg  40 mg Oral BID Pricilla Loveless, MD   40 mg at 05/06/20 0919  . protein supplement (ENSURE MAX) liquid  11 oz Oral BID Pricilla Loveless, MD   11 oz at 05/06/20 1054  . senna-docusate (Senokot-S) tablet 1 tablet  1 tablet Oral Daily Pricilla Loveless, MD   1 tablet at 05/06/20 0916  . zinc sulfate capsule 220 mg  220 mg Oral Daily Pricilla Loveless, MD   220 mg at 05/06/20 1607   Current Outpatient Medications  Medication Sig Dispense Refill  . Amino Acids-Protein Hydrolys (FEEDING SUPPLEMENT, PRO-STAT SUGAR FREE 64,) LIQD Take 30 mLs by mouth 2 (two) times daily. 887 mL 0  . apixaban (ELIQUIS) 5 MG TABS tablet Take 5 mg by mouth 2 (two) times daily.    Marland Kitchen ascorbic acid (VITAMIN C) 500 MG tablet Take 500 mg by mouth daily.     Marland Kitchen atorvastatin (LIPITOR) 10 MG tablet Take 10 mg by mouth at bedtime.    . calcium carbonate (TUMS - DOSED IN MG ELEMENTAL CALCIUM) 500 MG chewable tablet Chew 1 tablet by mouth daily.    . carvedilol (COREG) 25 MG tablet Take 12.5 mg by mouth 2 (two) times daily with a meal.    . Cholecalciferol (VITAMIN D) 125 MCG (5000 UT) CAPS Take 10,000 Units by mouth once a week. Wednesday    . divalproex (DEPAKOTE SPRINKLE) 125 MG capsule Take 125 mg by mouth daily. Every afternoon.    . divalproex (DEPAKOTE) 500 MG DR tablet Take 500 mg by mouth 2 (two) times daily.    . Emollient (EUCERIN INTENSIVE REPAIR) LOTN Apply 1 application topically daily. To both knees for rash.    . furosemide (LASIX) 40 MG tablet Take 40 mg by mouth daily.     . hydrALAZINE (APRESOLINE) 25 MG tablet Take 1 tablet (25 mg total) by mouth every 8 (eight) hours.    . hydrocortisone cream 1 % Apply topically 2 (two) times daily. (Patient taking differently: Apply 1 application topically 2 (two) times daily. ) 30 g 0  . insulin glargine (LANTUS) 100 UNIT/ML injection Inject 10 Units into the  skin at bedtime. Notify PEC for CBG less than 100 or greater than 250.    Marland Kitchen Ipratropium-Albuterol (COMBIVENT RESPIMAT)  20-100 MCG/ACT AERS respimat Inhale 1 puff into the lungs every 6 (six) hours.    Marland Kitchen LORazepam (ATIVAN) 0.5 MG tablet Take 0.5 mg by mouth 2 (two) times daily as needed for anxiety.    Marland Kitchen losartan (COZAAR) 25 MG tablet Take 50 mg by mouth daily.    . Multiple Vitamin (MULTIVITAMIN WITH MINERALS) TABS tablet Take 1 tablet by mouth daily.    . NON FORMULARY Take 120 mLs by mouth daily. Medpass to promote healing    . nutrition supplement, JUVEN, (JUVEN) PACK Take 1 packet by mouth 2 (two) times daily between meals.  0  . pantoprazole (PROTONIX) 40 MG tablet Take 1 tablet (40 mg total) by mouth 2 (two) times daily. (Patient taking differently: Take 40 mg by mouth daily. ) 30 tablet 0  . sennosides-docusate sodium (SENOKOT-S) 8.6-50 MG tablet Take 1 tablet by mouth daily.    . sitaGLIPtin-metformin (JANUMET) 50-1000 MG tablet Take 1 tablet by mouth 2 (two) times daily with a meal.    . zinc sulfate 220 (50 Zn) MG capsule Take 220 mg by mouth daily.    Marland Kitchen acetaminophen (TYLENOL) 325 MG tablet Take 650 mg by mouth every 6 (six) hours as needed for mild pain or headache.    . diphenhydrAMINE (BENADRYL) 2 % cream Apply 1 application topically 3 (three) times daily as needed for itching.    . docusate sodium (COLACE) 100 MG capsule Take 1 capsule (100 mg total) by mouth 2 (two) times daily as needed for mild constipation. 10 capsule 0  . Ensure Max Protein (ENSURE MAX PROTEIN) LIQD Take 330 mLs (11 oz total) by mouth 2 (two) times daily. (Patient not taking: Reported on 05/05/2020)      Musculoskeletal:   Psychiatric Specialty Exam: Physical Exam Vitals reviewed.  Psychiatric:        Mood and Affect: Mood normal.        Behavior: Behavior normal.     Review of Systems  Psychiatric/Behavioral: Positive for agitation, confusion and hallucinations. The patient is nervous/anxious.   All other systems reviewed and are negative.   Blood pressure (!) 177/97, pulse 73, temperature 98.1 F (36.7 C),  temperature source Oral, resp. rate 18, height 5\' 6"  (1.676 m), weight 92.5 kg, SpO2 93 %.Body mass index is 32.91 kg/m.  General Appearance: Casual paper scrubs , resting in bed  Eye Contact:  Minimal  Speech:  Clear and Coherent and Pressured  Volume:  Normal  Mood:  Anxious and Irritable  Affect:  Labile  Thought Process:  Disorganized, Irrelevant and Descriptions of Associations: Tangential  Orientation:  Other:  Self  Thought Content:  Hallucinations: None and Paranoid Ideation  Suicidal Thoughts:  No  Homicidal Thoughts:  No  Memory:  Immediate;   Fair Recent;   Fair  Judgement:  Impaired  Insight:  Shallow  Psychomotor Activity:  Restlessness  Concentration:  Concentration: Poor  Recall:  Poor  Fund of Knowledge:  Poor  Language:  Fair  Akathisia:  No  Handed:  Right  AIMS (if indicated):     Assets:  Communication Skills Desire for Improvement Resilience Social Support  ADL's:  Intact  Cognition:  WNL  Sleep:       Disposition: Recommend psychiatric Inpatient admission when medically cleared.  - Continue medication as directed Initiated Zyprexa Zydis 5 mg PO BID  -CSW to  seek Geropsychiatry inpatient care   This service was provided via telemedicine using a 2-way, interactive audio and video technology.  Names of all persons participating in this telemedicine service and their role in this encounter. Name: Navneet Schmuck Role: patient   Name: T.Peter Daquila Role: NP   Name: Ephriam Knuckles Role: NP       Oneta Rack, NP 05/09/2020 10:21 AM

## 2020-05-10 ENCOUNTER — Encounter (HOSPITAL_COMMUNITY): Payer: Self-pay | Admitting: Registered Nurse

## 2020-05-10 LAB — CBG MONITORING, ED
Glucose-Capillary: 142 mg/dL — ABNORMAL HIGH (ref 70–99)
Glucose-Capillary: 156 mg/dL — ABNORMAL HIGH (ref 70–99)

## 2020-05-10 MED ORDER — ZIPRASIDONE MESYLATE 20 MG IM SOLR
20.0000 mg | Freq: Once | INTRAMUSCULAR | Status: AC
  Administered 2020-05-10: 20 mg via INTRAMUSCULAR
  Filled 2020-05-10: qty 20

## 2020-05-10 NOTE — ED Provider Notes (Signed)
Was made aware that there was a possible problem with the patient's IVC paperwork from 5 days ago.  I looked through the paperwork and it appears there could be a discrepancy in the description of patient history and the documentation.  I called the psychiatry team and spoke with Suvon Rankin who recommended we tell the daytime team and social work team about this possible problem for them to address in the morning when they have a group shared rounds to discuss all the patient's.  She suspected that the correct paperwork may have just been misstapled on the paperwork presented to me.  Otherwise, there were no complaints about the patient.  Will direct the oncoming team and daytime team to investigate further.   Evalise Abruzzese, Canary Brim, MD 05/10/20 2226

## 2020-05-10 NOTE — ED Provider Notes (Signed)
Emergency Medicine Observation Re-evaluation Note  Becky Hendricks is a 74 y.o. female, seen on rounds today.  Pt initially presented to the ED for complaints of Psychiatric Evaluation Currently, the patient is IVCd pending psychiatry inpatient placement.  Physical Exam  BP (!) 172/111 (BP Location: Right Arm) Comment: RN aware  Pulse 90   Temp 98.7 F (37.1 C) (Axillary)   Resp 18   Ht 5\' 6"  (1.676 m)   Wt 92.5 kg   LMP  (LMP Unknown)   SpO2 95%   BMI 32.91 kg/m  Physical Exam General: Well appearing. Cardiac: Well perfused.  Lungs: Even, unlabored respirations. Psych: Calm and cooperative.   ED Course / MDM  EKG:EKG Interpretation  Date/Time:  Thursday May 05 2020 13:29:19 EDT Ventricular Rate:  69 PR Interval:    QRS Duration: 94 QT Interval:  377 QTC Calculation: 404 R Axis:   50 Text Interpretation: Sinus rhythm vs afib Atrial premature complexes in couplets Abnormal R-wave progression, early transition Nonspecific T abnrm, anterolateral leads Confirmed by 10-09-1996 [phone]) on 05/05/2020 1:31:22 PM    I have reviewed the labs performed to date as well as medications administered while in observation.   Plan  Current plan is for inpatient psychiatry placement.  Patient is under full IVC at this time.   05/07/2020, MD 05/11/20 [phone]

## 2020-05-10 NOTE — Consult Note (Signed)
Telepsych Consultation   Reason for Consult:  Psychosis Referring Physician:  EPD Location of [place]: [place] Location of Provider: [facility]  Patient Identification: Becky Hendricks MRN:  [id number] Principal Diagnosis: Bipolar disorder [facility]) Diagnosis:  Principal Problem:   Bipolar disorder (HCC)  HPI:  Per EDP Report: 74 year old female presents from her nursing facility with altered mental status. The provider sent her out because of psychosis, unclear if it is her psychiatric disease versus infection. A urinalysis was reportedly negative. No fevers. History is primarily taken from Becky Hendricks is the Publishing copy. Thinks she has maybe had some recent medication changes but is not sure what. Has a longstanding psychiatric history. The patient talks to me like I have seen her often before, which to my knowledge I have not. She also talks about how she needs to go to the maternity ward as she is pregnant and has been pregnant since 1965.   Total Time spent with patient: 15 minutes   Psychiatric Assessment: 9/298/2021 Subjective:   Becky Hendricks, 74 y.o., female patient seen via tele psych by this provider, consulted with consulted with Dr. Lucianne Muss; and chart reviewed on 05/10/20.  On evaluation Becky Hendricks is laying in bed.  Patient is observed responding visual and auditory hallucinations related to her looking at something and talking to someone who is not there.  She did wave at tele prompt machine with this provider said hello to her but she would not answer any questions she just kept looking/talking to something not seen by this provider.  Patient continues to be disorganized, delusional and tangential.  Patient also continues to refuse her medications.  Depakote changed to sprinkles so that it can be added to food when given.  Will change Risperdal and Cogentin to PO or IM.  There may be a need for second opinion if patient continues to refuse her  medications.   CSW to continue seeking higher level care.    Past Psychiatric History:  See above  Risk to Self: Suicidal Ideation: No Suicidal Intent: No Is patient at risk for suicide?: No Suicidal Plan?: No Access to Means: No What has been your use of drugs/alcohol within the last 12 months?: none How many times?: 0 Other Self Harm Risks: dementia Triggers for Past Attempts: None known Intentional Self Injurious Behavior: None Risk to Others: Homicidal Ideation: No Thoughts of Harm to Others: No Current Homicidal Intent: No-Not Currently/Within Last 6 Months Current Homicidal Plan: No Access to Homicidal Means: No Identified Victim: none History of harm to others?: No Assessment of Violence: None Noted Violent Behavior Description: none reported Does patient have access to weapons?: No Criminal Charges Pending?: No Does patient have a court date: No Prior Inpatient Therapy: Prior Inpatient Therapy:  (UTA) Prior Outpatient Therapy: Prior Outpatient Therapy:  (UTA)  Past Medical History:  Past Medical History:  Diagnosis Date  . Atrial fibrillation (HCC)   . Bipolar disorder (HCC)   . Bipolar disorder (HCC) 04/27/2020  . Clostridium difficile diarrhea 08/02/2019  . Dehydration   . Essential hypertension   . Fall 02/11/2020  . Morbid obesity (HCC)   . Type 2 diabetes mellitus (HCC)   . Weakness     Past Surgical History:  Procedure Laterality Date  . INCISION AND DRAINAGE PERIRECTAL ABSCESS Left 12/04/2019   Procedure: IRRIGATION AND DEBRIDEMENT LEFT BUTTOCK ABSCESS;  Surgeon: Violeta Gelinas, MD;  Location: [facility] OR;  Service: General;  Laterality: Left;  . INCISION AND DRAINAGE PERIRECTAL ABSCESS Left 12/10/2019  Procedure: REPEAT IRRIGATION AND DEBRIDEMENT BUTTOCK  ABSCESS;  Surgeon: Abigail Miyamoto, MD;  Location: [facility] OR;  Service: General;  Laterality: Left;   Family History:  Family History  Family history unknown: Yes   Family Psychiatric  History:  Social  History:  Social History   Substance and Sexual Activity  Alcohol Use Not Currently     Social History   Substance and Sexual Activity  Drug Use Not Currently    Social History   Socioeconomic History  . Marital status: Single    Spouse name: Not on file  . Number of children: Not on file  . Years of education: Not on file  . Highest education level: Not on file  Occupational History  . Not on file  Tobacco Use  . Smoking status: Never Smoker  . Smokeless tobacco: Never Used  Vaping Use  . Vaping Use: Unknown  Substance and Sexual Activity  . Alcohol use: Not Currently  . Drug use: Not Currently  . Sexual activity: Not Currently    Birth control/protection: Post-menopausal  Other Topics Concern  . Not on file  Social History Narrative  . Not on file   Social Determinants of Health   Financial Resource Strain:   . Difficulty of Paying Living Expenses: Not on file  Food Insecurity:   . Worried About Programme researcher, broadcasting/film/video in the Last Year: Not on file  . Ran Out of Food in the Last Year: Not on file  Transportation Needs:   . Lack of Transportation (Medical): Not on file  . Lack of Transportation (Non-Medical): Not on file  Physical Activity:   . Days of Exercise per Week: Not on file  . Minutes of Exercise per Session: Not on file  Stress:   . Feeling of Stress : Not on file  Social Connections:   . Frequency of Communication with Friends and Family: Not on file  . Frequency of Social Gatherings with Friends and Family: Not on file  . Attends Religious Services: Not on file  . Active Member of Clubs or Organizations: Not on file  . Attends Banker Meetings: Not on file  . Marital Status: Not on file   Additional Social History:    Allergies:   Allergies  Allergen Reactions  . Chlorhexidine Gluconate Itching  . Acetazolamide Er Rash    Labs: No results Hendricks for this or any previous visit (from the past 48 hour(s)).  Medications:   Current Facility-Administered Medications  Medication Dose Route Frequency Provider Last Rate Last Admin  . acetaminophen (TYLENOL) tablet 650 mg  650 mg Oral Q6H PRN Pricilla Loveless, MD      . apixaban (ELIQUIS) tablet 5 mg  5 mg Oral BID Pricilla Loveless, MD   5 mg at 05/06/20 1011  . ascorbic acid (VITAMIN C) tablet 500 mg  500 mg Oral BID Pricilla Loveless, MD   500 mg at 05/06/20 0919  . atorvastatin (LIPITOR) tablet 10 mg  10 mg Oral QHS Pricilla Loveless, MD   10 mg at 05/05/20 2337  . risperiDONE (RISPERDAL M-TABS) disintegrating tablet 1 mg  1 mg Oral QHS Sheniece Ruggles B, NP       And  . benztropine (COGENTIN) tablet 0.5 mg  0.5 mg Oral QHS Kariya Lavergne B, NP      . calcium carbonate (TUMS - dosed in mg elemental calcium) chewable tablet 200 mg of elemental calcium  1 tablet Oral Daily Pricilla Loveless, MD   200 mg  of elemental calcium at 05/06/20 0921  . carvedilol (COREG) tablet 12.5 mg  12.5 mg Oral BID WC Pricilla Loveless, MD   12.5 mg at 05/07/20 1002  . diphenhydrAMINE-zinc acetate (BENADRYL) 2-0.1 % cream 1 application  1 application Topical TID PRN Pricilla Loveless, MD      . divalproex (DEPAKOTE SPRINKLE) capsule 250 mg  250 mg Oral TID Jenetta Wease B, NP      . docusate sodium (COLACE) capsule 100 mg  100 mg Oral BID PRN Pricilla Loveless, MD      . famotidine (PEPCID) tablet 20 mg  20 mg Oral BID Pricilla Loveless, MD   20 mg at 05/06/20 0915  . feeding supplement (PRO-STAT SUGAR FREE 64) liquid 30 mL  30 mL Oral BID Pricilla Loveless, MD   30 mL at 05/06/20 1011  . furosemide (LASIX) tablet 40 mg  40 mg Oral Daily Pricilla Loveless, MD   40 mg at 05/07/20 1002  . hydrALAZINE (APRESOLINE) tablet 25 mg  25 mg Oral Q8H Pricilla Loveless, MD   25 mg at 05/06/20 1351  . insulin glargine (LANTUS) injection 10 Units  10 Units Subcutaneous QHS Pricilla Loveless, MD      . Ipratropium-Albuterol (COMBIVENT) respimat 1 puff  1 puff Inhalation Q6H PRN Lorre Nick, MD      . linagliptin (TRADJENTA)  tablet 5 mg  5 mg Oral Q breakfast Pricilla Loveless, MD   5 mg at 05/06/20 6644   And  . metFORMIN (GLUCOPHAGE) tablet 1,000 mg  1,000 mg Oral Q breakfast Pricilla Loveless, MD   1,000 mg at 05/06/20 0918  . losartan (COZAAR) tablet 50 mg  50 mg Oral Daily Pricilla Loveless, MD   50 mg at 05/06/20 1013  . metFORMIN (GLUCOPHAGE) tablet 1,000 mg  1,000 mg Oral Q supper Pricilla Loveless, MD   1,000 mg at 05/06/20 1717  . multivitamin with minerals tablet 1 tablet  1 tablet Oral Daily Pricilla Loveless, MD   1 tablet at 05/06/20 0920  . nutrition supplement (JUVEN) (JUVEN) powder packet 1 packet  1 packet Oral BID BM Pricilla Loveless, MD   1 packet at 05/06/20 1350  . pantoprazole (PROTONIX) EC tablet 40 mg  40 mg Oral BID Pricilla Loveless, MD   40 mg at 05/06/20 0919  . protein supplement (ENSURE MAX) liquid  11 oz Oral BID Pricilla Loveless, MD   11 oz at 05/06/20 1054  . senna-docusate (Senokot-S) tablet 1 tablet  1 tablet Oral Daily Pricilla Loveless, MD   1 tablet at 05/06/20 0916  . zinc sulfate capsule 220 mg  220 mg Oral Daily Pricilla Loveless, MD   220 mg at 05/06/20 0347   Current Outpatient Medications  Medication Sig Dispense Refill  . Amino Acids-Protein Hydrolys (FEEDING SUPPLEMENT, PRO-STAT SUGAR FREE 64,) LIQD Take 30 mLs by mouth 2 (two) times daily. 887 mL 0  . apixaban (ELIQUIS) 5 MG TABS tablet Take 5 mg by mouth 2 (two) times daily.    Marland Kitchen ascorbic acid (VITAMIN C) 500 MG tablet Take 500 mg by mouth daily.     Marland Kitchen atorvastatin (LIPITOR) 10 MG tablet Take 10 mg by mouth at bedtime.    . calcium carbonate (TUMS - DOSED IN MG ELEMENTAL CALCIUM) 500 MG chewable tablet Chew 1 tablet by mouth daily.    . carvedilol (COREG) 25 MG tablet Take 12.5 mg by mouth 2 (two) times daily with a meal.    . Cholecalciferol (VITAMIN D) 125 MCG (5000 UT) CAPS  Take 10,000 Units by mouth once a week. Wednesday    . divalproex (DEPAKOTE SPRINKLE) 125 MG capsule Take 125 mg by mouth daily. Every afternoon.    .  divalproex (DEPAKOTE) 500 MG DR tablet Take 500 mg by mouth 2 (two) times daily.    . Emollient (EUCERIN INTENSIVE REPAIR) LOTN Apply 1 application topically daily. To both knees for rash.    . furosemide (LASIX) 40 MG tablet Take 40 mg by mouth daily.     . hydrALAZINE (APRESOLINE) 25 MG tablet Take 1 tablet (25 mg total) by mouth every 8 (eight) hours.    . hydrocortisone cream 1 % Apply topically 2 (two) times daily. (Patient taking differently: Apply 1 application topically 2 (two) times daily. ) 30 g 0  . insulin glargine (LANTUS) 100 UNIT/ML injection Inject 10 Units into the skin at bedtime. Notify PEC for CBG less than 100 or greater than 250.    Marland Kitchen. Ipratropium-Albuterol (COMBIVENT RESPIMAT) 20-100 MCG/ACT AERS respimat Inhale 1 puff into the lungs every 6 (six) hours.    Marland Kitchen. LORazepam (ATIVAN) 0.5 MG tablet Take 0.5 mg by mouth 2 (two) times daily as needed for anxiety.    Marland Kitchen. losartan (COZAAR) 25 MG tablet Take 50 mg by mouth daily.    . Multiple Vitamin (MULTIVITAMIN WITH MINERALS) TABS tablet Take 1 tablet by mouth daily.    . NON FORMULARY Take 120 mLs by mouth daily. Medpass to promote healing    . nutrition supplement, JUVEN, (JUVEN) PACK Take 1 packet by mouth 2 (two) times daily between meals.  0  . pantoprazole (PROTONIX) 40 MG tablet Take 1 tablet (40 mg total) by mouth 2 (two) times daily. (Patient taking differently: Take 40 mg by mouth daily. ) 30 tablet 0  . sennosides-docusate sodium (SENOKOT-S) 8.6-50 MG tablet Take 1 tablet by mouth daily.    . sitaGLIPtin-metformin (JANUMET) 50-1000 MG tablet Take 1 tablet by mouth 2 (two) times daily with a meal.    . zinc sulfate 220 (50 Zn) MG capsule Take 220 mg by mouth daily.    Marland Kitchen. acetaminophen (TYLENOL) 325 MG tablet Take 650 mg by mouth every 6 (six) hours as needed for mild pain or headache.    . diphenhydrAMINE (BENADRYL) 2 % cream Apply 1 application topically 3 (three) times daily as needed for itching.    . docusate sodium  (COLACE) 100 MG capsule Take 1 capsule (100 mg total) by mouth 2 (two) times daily as needed for mild constipation. 10 capsule 0  . Ensure Max Protein (ENSURE MAX PROTEIN) LIQD Take 330 mLs (11 oz total) by mouth 2 (two) times daily. (Patient not taking: Reported on 05/05/2020)      Musculoskeletal:   Psychiatric Specialty Exam: Physical Exam Vitals reviewed.  Psychiatric:        Attention and Perception: She is inattentive. She perceives auditory and visual hallucinations.        Mood and Affect: Affect is labile.        Speech: Speech normal.        Thought Content: Thought content is delusional.        Cognition and Memory: Cognition is impaired (Related to acuity of her current condition). Memory is impaired (Unable to assess at this time).        Judgment: Judgment is impulsive.     Review of Systems  Unable to perform ROS: Acuity of condition (Patient unable to answer questions related acuity of her condition)  Blood pressure (!) 172/111, pulse 90, temperature 98.7 F (37.1 C), temperature source Axillary, resp. rate 18, height 5\' 6"  (1.676 m), weight 92.5 kg, SpO2 95 %.Body mass index is 32.91 kg/m.  General Appearance: Casual paper scrubs , resting in bed  Eye Contact:  Minimal  Speech:  Clear and Coherent and Pressured  Volume:  Normal  Mood:  Anxious and Irritable  Affect:  Labile  Thought Process:  Disorganized, Irrelevant and Descriptions of Associations: Tangential  Orientation:  Other:  Self  Thought Content:  Hallucinations: Auditory Visual and Paranoid Ideation  Patient looking around as if looking at someone and talking to someone that is not there  Suicidal Thoughts:  No  Homicidal Thoughts:  No  Memory:  Unable to assess related to current condiditon  Judgement:  Impaired  Insight:  Shallow  Psychomotor Activity:  Restlessness  Concentration:  Concentration: Poor  Recall:  Poor  Fund of Knowledge:  Poor  Language:  Fair  Akathisia:  No  Handed:  Right   AIMS (if indicated):     Assets:  Communication Skills Desire for Improvement Resilience Social Support  ADL's:  Impaired  Currently needs assistance related to acuity of condition  Cognition:  WNL  Sleep:       Disposition: Recommend psychiatric Inpatient admission when medically cleared.  Medication changes 05/11/11 Depakote changed to Depakote sprinkles 250 mg Tid Discontinues Zyprexa Zydis 5 mg related to obesity and diabetes Started Risperdal 1 mg Q hs with Cogentin 0.5 mg Q hs for psychosis, and EPS Valproic acid ordered and EKG orderdered Labs reviewed:  Valproic acid level 32 below therapeutic level  It appears that patient may be refusing or not taking her medications.  Depakote sprinkles ordered because it can be put in patients food; or sandwich, apple sauce to be give to patient.  Risperdal for psychosis and to see if tolerated. Will change order to PO or IM.  May also need to do a second opinion to force meds if patient is refusing.    This service was provided via telemedicine using a 2-way, interactive audio and video technology.  Names of all persons participating in this telemedicine service and their role in this encounter. Name: Clotilde Loth Role: patient   Name: Dr. Theda Hendricks Role: Psychiatrist  Name: Lucianne Muss Role: NP       Becky Found, NP 05/10/2020 10:56 AM

## 2020-05-10 NOTE — Progress Notes (Signed)
05/10/2020  Offered patient her medicines and she refused to take her medicines. MD suggested crushing and placing in applesauce. I crushed her medicines and placed them in applesauce stiring it up. Patient refused for me to feed her a spoon of the applesauce. Gave patient the applesauce and spoon and she just left it on the tray refusing to eat the applesauce.

## 2020-05-10 NOTE — BH Assessment (Addendum)
BHH Assessment Progress Note  Per Shuvon Rankin, NP, this involuntary pt continues to require psychiatric hospitalization at this time.  The following facilities have been contacted to seek placement for this pt, with results as noted:  Beds available, information sent, decision pending: [facility]   Previously referred; decision pending: [facility]  Not referred: [facility] (due to medical acuity) [facility] (due to medical acuity)  At capacity: [facility] (geriatric unit currently under quarantine) [facility] (unit currently closed) [facility] (unit currently closed) [facility]   Doylene Canning, [place] [employer] Health Coordinator [phone]

## 2020-05-11 LAB — CBG MONITORING, ED
Glucose-Capillary: 145 mg/dL — ABNORMAL HIGH (ref 70–99)
Glucose-Capillary: 157 mg/dL — ABNORMAL HIGH (ref 70–99)

## 2020-05-11 MED ORDER — OLANZAPINE 10 MG IM SOLR
5.0000 mg | Freq: Every day | INTRAMUSCULAR | Status: DC
  Administered 2020-05-11 – 2020-05-16 (×4): 5 mg via INTRAMUSCULAR
  Filled 2020-05-11 (×5): qty 10

## 2020-05-11 MED ORDER — OLANZAPINE 5 MG PO TBDP
5.0000 mg | ORAL_TABLET | Freq: Every day | ORAL | Status: DC
  Administered 2020-05-16 – 2020-05-20 (×3): 5 mg via ORAL
  Filled 2020-05-11 (×7): qty 1

## 2020-05-11 NOTE — Progress Notes (Signed)
Received call from Alfonzo Feller, RN regarding patient's refusal to take schedule medications.  Medication orders were placed for Zyprexa 5 mg  IM or Zyprexa disintegrating tablet 5 mg.  Patient labs were reviewed. BUN, creatinine, AST, and ALT were within acceptable limits. EKG was also review. QTc interval was 404.

## 2020-05-11 NOTE — ED Notes (Signed)
Call to [facility] r/t medication refusal by pt message left on his service and waiting his return call.

## 2020-05-11 NOTE — ED Provider Notes (Signed)
Do believe patient needs forced medications as psychiatrist also recommends to help with her care.   Virgina Norfolk, DO 05/11/20 1203

## 2020-05-11 NOTE — ED Notes (Signed)
Pt in bed resting all day, talking to self.

## 2020-05-11 NOTE — BH Assessment (Addendum)
BHH Assessment Progress Note  On this day pt's IVC documents, initiated by EDP Linwood Dibbles, MD on 05/06/2020 were found to have an error.  This Clinical research associate called Chief Magistrate Chapman Moss to see if the problem can be corrected and found that it cannot.  Per Assunta Found, NP, pt continues to require psychiatric hospitalization and to meet criteria for IVC.  EDP Virgina Norfolk, DO concurs with this decision and has initiated a new IVC.  IVC documents have been faxed to [facility], and at [employer] confirms receipt.  He has since faxed Findings and Custody Order to this Clinical research associate.  At 11:34 I called [employer] and spoke to [employer], who took demographic information, agreeing to dispatch law enforcement to fill out Return of Service.  As of this writing, arrival of law enforcement is pending.  I will continue to seek placement for pt.  Doylene Canning, [place] Behavioral Health Coordinator [phone]   Addendum:  The following facilities have been contacted to seek placement for this pt, with results as noted:  Beds available, information sent, decision pending: [facility] (updated referral) [place] [facility] (updated referral) [facility]  Declined: [facility] (medical acuity) [facility] (medical acuity)  Unable to reach: [facility] (follow up on previous referral; left message at 12:55) [facility] (left message at 12:59)  Not referred: [facility] (due to medical acuity) [facility] (due to medical acuity)  At capacity: [facility] (under quarantine) [place] (unit currently closed) [facility] (unit currently closed) Idelle Leech, [place] Behavioral Health Coordinator [phone]

## 2020-05-11 NOTE — ED Notes (Signed)
Dressing change was done per orders today. Per Addison Naegeli RN.

## 2020-05-11 NOTE — ED Notes (Signed)
Call to [facility] provider requesting medication adjustment to help modify pt behaveior to promote routine medication complance .

## 2020-05-11 NOTE — Progress Notes (Signed)
Received a call from [facility] stating that they would not be able to accept patient.   Ladoris Gene MSW,LCSWA,LCASA Clinical Social Worker  [place] Disposition, CSW [phone] (cell)

## 2020-05-12 LAB — CBG MONITORING, ED
Glucose-Capillary: 121 mg/dL — ABNORMAL HIGH (ref 70–99)
Glucose-Capillary: 160 mg/dL — ABNORMAL HIGH (ref 70–99)
Glucose-Capillary: 130 mg/dL — ABNORMAL HIGH (ref 70–99)

## 2020-05-12 NOTE — BH Assessment (Addendum)
BHH Assessment Progress Note  Per Berneice Heinrich, NP, this pt appears to have improved, but continues to require psychiatric hospitalization at this time.  Pt remains under IVC.  The following facilities have been contacted to seek placement for this pt, with results as noted:  Beds available, information sent, decision pending: [facility] (re-referred 9/29; review is pending)  Declined: [facility] (due to medical acuity) [facility] (9/29 due to medical acuity) [facility] (due to medical acuity) [place] (9/29 due to medical acuity) [facility] (due to medical acuity)  Not referred: [facility] (due to medical acuity) [facility] (due to medical acuity)  At capacity: [place] (under quarantine) [place] (unit currently closed) [place] (unit currently closed) [facility] (no female beds; advised to call back tomorrow, 10/1) Gavin Pound, [place] Behavioral Health Coordinator [phone]

## 2020-05-12 NOTE — Progress Notes (Signed)
2nd shift ED CSW received a handoff from the 1st shift [facility] ED Disposition.   Pt is a total care patient and will need to go by PT[facility] with the Sheriff's Dept following.  Per disposition, [facility] may demand cash payment prior to D/C and [facility] in [place], [place].   This payment, if needed, and where it would come from would have to be clarified with CSW leadership to determine what cost center would be responsible.  [facility] has been provided with 2nd shift Disposition's phone number and with the ED TCU's phone.  CSW will continue to follow for D/C needs.  Dorothe Pea. Lumir Demetriou  MSW, LCSW, LCAS, CCS Transitions of Care Clinical Social Worker Care Coordination Department Ph: [phone]

## 2020-05-12 NOTE — ED Notes (Signed)
Pt refused medication. Pt did not eat dinner.

## 2020-05-12 NOTE — Progress Notes (Signed)
Clinician received call from Nurse [facility], inquiring about most previous COVID test for patient.  Nurse informed writer that before they would accept the patient they need a negative COVID, test taking within 24 hours of transportation.  Spoke with [facility] and requested a new test to be done.  Once test is completed it should be faxed [phone] Attn: [facility].  Ladoris Gene MSW,LCSWA,LCASA Clinical Social Worker  [place] Disposition, CSW [phone] (cell)

## 2020-05-12 NOTE — BH Assessment (Signed)
BHH Assessment Progress Note  Per Berneice Heinrich, NP, this involuntary pt continues to require psychiatric hospitalization at this time.  At 15:48 Harrold Donath calls from [facility] to report that pt has been accepted to their facility by Dr Enedina Finner.  The unit can be reached at [phone], however, they will not be ready to take nurse to nurse report until the bed is ready; they will call us at [phone] to take report.  Berneice Heinrich, NP and EDP Susy Frizzle, MD concur with this decision.  Pt's nurse, Waynetta Sandy, has been notified along with Ladoris Gene, CSW and York Grice, CSW.  Pt is likely to need medical transport, but with the support of [facility].  Doylene Canning Behavioral Health Coordinator [phone]

## 2020-05-12 NOTE — Consult Note (Signed)
[facility] Progress Note  05/12/2020 11:21 AM Becky SersJanina Hendricks  MRN:  [id number] Subjective: Patient states "someone should be locked up and removed from the premises."  Patient appears to be referring to RN who is in room attempting to help facilitate telepsychiatry assessment.  Patient appears to have targeted RN and appears to believe this nurse should be removed from the premises.  Patient continues to have some paranoid ideations apparent when interacting with attending RN.  Patient assessed by nurse practitioner.  Patient alert to self and situation currently.  Patient cooperative during assessment.  Patient denies suicidal ideations.  Patient denies history of self-harm behaviors, denies history of suicide attempts.  Patient denies homicidal ideations.  Patient denies both auditory and visual hallucinations. During previous assessment believes she was currently pregnant however patient today understands that she is not pregnant.  Patient resides in a nursing facility.  Patient denies access to weapons.  Patient's medications have been updated to include mood stabilizer and antipsychotic.  Patient offered support and encouragement.  Principal Problem: Bipolar disorder (HCC) Diagnosis:  Principal Problem:   Bipolar disorder (HCC)  Total Time spent with patient: 20 minutes  Past Psychiatric History: Bipolar disorder  Past Medical History:  Past Medical History:  Diagnosis Date  . Atrial fibrillation (HCC)   . Bipolar disorder (HCC)   . Bipolar disorder (HCC) 04/27/2020  . Clostridium difficile diarrhea 08/02/2019  . Dehydration   . Essential hypertension   . Fall 02/11/2020  . Morbid obesity (HCC)   . Type 2 diabetes mellitus (HCC)   . Weakness     Past Surgical History:  Procedure Laterality Date  . INCISION AND DRAINAGE PERIRECTAL ABSCESS Left 12/04/2019   Procedure: IRRIGATION AND DEBRIDEMENT LEFT BUTTOCK ABSCESS;  Surgeon: Violeta Gelinashompson, Burke, MD;  [facility];  Service:  General;  Laterality: Left;  . INCISION AND DRAINAGE PERIRECTAL ABSCESS Left 12/10/2019   Procedure: REPEAT IRRIGATION AND DEBRIDEMENT BUTTOCK  ABSCESS;  Surgeon: Abigail MiyamotoBlackman, Douglas, MD;  Location: [facility] OR;  Service: General;  Laterality: Left;   Family History:  Family History  Family history unknown: Yes   Family Psychiatric  History: none reported Social History:  Social History   Substance and Sexual Activity  Alcohol Use Not Currently     Social History   Substance and Sexual Activity  Drug Use Not Currently    Social History   Socioeconomic History  . Marital status: Single    Spouse name: Not on file  . Number of children: Not on file  . Years of education: Not on file  . Highest education level: Not on file  Occupational History  . Not on file  Tobacco Use  . Smoking status: Never Smoker  . Smokeless tobacco: Never Used  Vaping Use  . Vaping Use: Unknown  Substance and Sexual Activity  . Alcohol use: Not Currently  . Drug use: Not Currently  . Sexual activity: Not Currently    Birth control/protection: Post-menopausal  Other Topics Concern  . Not on file  Social History Narrative  . Not on file   Social Determinants of Health   Financial Resource Strain:   . Difficulty of Paying Living Expenses: Not on file  Food Insecurity:   . Worried About Programme researcher, broadcasting/film/videounning Out of Food in the Last Year: Not on file  . Ran Out of Food in the Last Year: Not on file  Transportation Needs:   . Lack of Transportation (Medical): Not on file  . Lack of Transportation (Non-Medical): Not  on file  Physical Activity:   . Days of Exercise per Week: Not on file  . Minutes of Exercise per Session: Not on file  Stress:   . Feeling of Stress : Not on file  Social Connections:   . Frequency of Communication with Friends and Family: Not on file  . Frequency of Social Gatherings with Friends and Family: Not on file  . Attends Religious Services: Not on file  . Active Member of Clubs or  Organizations: Not on file  . Attends Banker Meetings: Not on file  . Marital Status: Not on file    Sleep: Fair  Appetite:  Fair  Current Medications: Current Facility-Administered Medications  Medication Dose Route Frequency Provider Last Rate Last Admin  . acetaminophen (TYLENOL) tablet 650 mg  650 mg Oral Q6H PRN Pricilla Loveless, MD      . apixaban Everlene Balls) tablet 5 mg  5 mg Oral BID Pricilla Loveless, MD   5 mg at 05/11/20 0900  . ascorbic acid (VITAMIN C) tablet 500 mg  500 mg Oral BID Pricilla Loveless, MD   500 mg at 05/11/20 0901  . atorvastatin (LIPITOR) tablet 10 mg  10 mg Oral QHS Pricilla Loveless, MD   10 mg at 05/05/20 2337  . calcium carbonate (TUMS - dosed in mg elemental calcium) chewable tablet 200 mg of elemental calcium  1 tablet Oral Daily Pricilla Loveless, MD   200 mg of elemental calcium at 05/06/20 0921  . carvedilol (COREG) tablet 12.5 mg  12.5 mg Oral BID WC Pricilla Loveless, MD   12.5 mg at 05/11/20 0858  . diphenhydrAMINE-zinc acetate (BENADRYL) 2-0.1 % cream 1 application  1 application Topical TID PRN Pricilla Loveless, MD      . divalproex (DEPAKOTE SPRINKLE) capsule 250 mg  250 mg Oral TID Rankin, Shuvon B, NP   250 mg at 05/11/20 0900  . docusate sodium (COLACE) capsule 100 mg  100 mg Oral BID PRN Pricilla Loveless, MD      . famotidine (PEPCID) tablet 20 mg  20 mg Oral BID Pricilla Loveless, MD   20 mg at 05/11/20 0900  . feeding supplement (PRO-STAT SUGAR FREE 64) liquid 30 mL  30 mL Oral BID Pricilla Loveless, MD   30 mL at 05/06/20 1011  . furosemide (LASIX) tablet 40 mg  40 mg Oral Daily Pricilla Loveless, MD   40 mg at 05/11/20 0900  . hydrALAZINE (APRESOLINE) tablet 25 mg  25 mg Oral Q8H Pricilla Loveless, MD   25 mg at 05/11/20 0857  . insulin glargine (LANTUS) injection 10 Units  10 Units Subcutaneous QHS Pricilla Loveless, MD      . Ipratropium-Albuterol (COMBIVENT) respimat 1 puff  1 puff Inhalation Q6H PRN Lorre Nick, MD      . linagliptin  (TRADJENTA) tablet 5 mg  5 mg Oral Q breakfast Pricilla Loveless, MD   5 mg at 05/11/20 0900   And  . metFORMIN (GLUCOPHAGE) tablet 1,000 mg  1,000 mg Oral Q breakfast Pricilla Loveless, MD   1,000 mg at 05/11/20 0857  . losartan (COZAAR) tablet 50 mg  50 mg Oral Daily Pricilla Loveless, MD   50 mg at 05/11/20 1000  . metFORMIN (GLUCOPHAGE) tablet 1,000 mg  1,000 mg Oral Q supper Pricilla Loveless, MD   1,000 mg at 05/06/20 1717  . multivitamin with minerals tablet 1 tablet  1 tablet Oral Daily Pricilla Loveless, MD   1 tablet at 05/06/20 0920  . nutrition supplement (JUVEN) (JUVEN)  powder packet 1 packet  1 packet Oral BID BM Pricilla Loveless, MD   1 packet at 05/11/20 1415  . OLANZapine (ZYPREXA) injection 5 mg  5 mg Intramuscular QHS Nwoko, Uchenna E, PA   5 mg at 05/11/20 2305   Or  . OLANZapine zydis (ZYPREXA) disintegrating tablet 5 mg  5 mg Oral QHS Nwoko, Uchenna E, PA      . pantoprazole (PROTONIX) EC tablet 40 mg  40 mg Oral BID Pricilla Loveless, MD   40 mg at 05/11/20 0900  . protein supplement (ENSURE MAX) liquid  11 oz Oral BID Pricilla Loveless, MD   11 oz at 05/11/20 0944  . senna-docusate (Senokot-S) tablet 1 tablet  1 tablet Oral Daily Pricilla Loveless, MD   1 tablet at 05/11/20 1000  . zinc sulfate capsule 220 mg  220 mg Oral Daily Pricilla Loveless, MD   220 mg at 05/11/20 0901   Current Outpatient Medications  Medication Sig Dispense Refill  . Amino Acids-Protein Hydrolys (FEEDING SUPPLEMENT, PRO-STAT SUGAR FREE 64,) LIQD Take 30 mLs by mouth 2 (two) times daily. 887 mL 0  . apixaban (ELIQUIS) 5 MG TABS tablet Take 5 mg by mouth 2 (two) times daily.    Marland Kitchen ascorbic acid (VITAMIN C) 500 MG tablet Take 500 mg by mouth daily.     Marland Kitchen atorvastatin (LIPITOR) 10 MG tablet Take 10 mg by mouth at bedtime.    . calcium carbonate (TUMS - DOSED IN MG ELEMENTAL CALCIUM) 500 MG chewable tablet Chew 1 tablet by mouth daily.    . carvedilol (COREG) 25 MG tablet Take 12.5 mg by mouth 2 (two) times daily with a  meal.    . Cholecalciferol (VITAMIN D) 125 MCG (5000 UT) CAPS Take 10,000 Units by mouth once a week. Wednesday    . divalproex (DEPAKOTE SPRINKLE) 125 MG capsule Take 125 mg by mouth daily. Every afternoon.    . divalproex (DEPAKOTE) 500 MG DR tablet Take 500 mg by mouth 2 (two) times daily.    . Emollient (EUCERIN INTENSIVE REPAIR) LOTN Apply 1 application topically daily. To both knees for rash.    . furosemide (LASIX) 40 MG tablet Take 40 mg by mouth daily.     . hydrALAZINE (APRESOLINE) 25 MG tablet Take 1 tablet (25 mg total) by mouth every 8 (eight) hours.    . hydrocortisone cream 1 % Apply topically 2 (two) times daily. (Patient taking differently: Apply 1 application topically 2 (two) times daily. ) 30 g 0  . insulin glargine (LANTUS) 100 UNIT/ML injection Inject 10 Units into the skin at bedtime. Notify PEC for CBG less than 100 or greater than 250.    Marland Kitchen Ipratropium-Albuterol (COMBIVENT RESPIMAT) 20-100 MCG/ACT AERS respimat Inhale 1 puff into the lungs every 6 (six) hours.    Marland Kitchen LORazepam (ATIVAN) 0.5 MG tablet Take 0.5 mg by mouth 2 (two) times daily as needed for anxiety.    Marland Kitchen losartan (COZAAR) 25 MG tablet Take 50 mg by mouth daily.    . Multiple Vitamin (MULTIVITAMIN WITH MINERALS) TABS tablet Take 1 tablet by mouth daily.    . NON FORMULARY Take 120 mLs by mouth daily. Medpass to promote healing    . nutrition supplement, JUVEN, (JUVEN) PACK Take 1 packet by mouth 2 (two) times daily between meals.  0  . pantoprazole (PROTONIX) 40 MG tablet Take 1 tablet (40 mg total) by mouth 2 (two) times daily. (Patient taking differently: Take 40 mg by mouth daily. ) 30  tablet 0  . sennosides-docusate sodium (SENOKOT-S) 8.6-50 MG tablet Take 1 tablet by mouth daily.    . sitaGLIPtin-metformin (JANUMET) 50-1000 MG tablet Take 1 tablet by mouth 2 (two) times daily with a meal.    . zinc sulfate 220 (50 Zn) MG capsule Take 220 mg by mouth daily.    Marland Kitchen acetaminophen (TYLENOL) 325 MG tablet Take 650  mg by mouth every 6 (six) hours as needed for mild pain or headache.    . diphenhydrAMINE (BENADRYL) 2 % cream Apply 1 application topically 3 (three) times daily as needed for itching.    . docusate sodium (COLACE) 100 MG capsule Take 1 capsule (100 mg total) by mouth 2 (two) times daily as needed for mild constipation. 10 capsule 0  . Ensure Max Protein (ENSURE MAX PROTEIN) LIQD Take 330 mLs (11 oz total) by mouth 2 (two) times daily. (Patient not taking: Reported on 05/05/2020)      Lab Results:  Results for orders placed or performed during the hospital encounter of 05/05/20 (from the past 48 hour(s))  CBG monitoring, ED     Status: Abnormal   Collection Time: 05/10/20 11:43 AM  Result Value Ref Range   Glucose-Capillary 142 (H) 70 - 99 mg/dL    Comment: Glucose reference range applies only to samples taken after fasting for at least 8 hours.  CBG monitoring, ED     Status: Abnormal   Collection Time: 05/10/20  9:44 PM  Result Value Ref Range   Glucose-Capillary 156 (H) 70 - 99 mg/dL    Comment: Glucose reference range applies only to samples taken after fasting for at least 8 hours.  CBG monitoring, ED     Status: Abnormal   Collection Time: 05/11/20  8:09 AM  Result Value Ref Range   Glucose-Capillary 145 (H) 70 - 99 mg/dL    Comment: Glucose reference range applies only to samples taken after fasting for at least 8 hours.  CBG monitoring, ED     Status: Abnormal   Collection Time: 05/11/20  1:09 PM  Result Value Ref Range   Glucose-Capillary 157 (H) 70 - 99 mg/dL    Comment: Glucose reference range applies only to samples taken after fasting for at least 8 hours.  CBG monitoring, ED     Status: Abnormal   Collection Time: 05/12/20  8:12 AM  Result Value Ref Range   Glucose-Capillary 121 (H) 70 - 99 mg/dL    Comment: Glucose reference range applies only to samples taken after fasting for at least 8 hours.   Comment 1 Notify RN     Blood Alcohol level:  Lab Results   Component Value Date   ETH <10 05/05/2020   ETH <10 02/01/2020    Physical Findings: AIMS:  , ,  ,  ,    CIWA:    COWS:     Musculoskeletal: Strength & Muscle Tone: decreased Gait & Station: unable to assess Patient leans: N/A  Psychiatric Specialty Exam: Physical Exam Vitals and nursing note reviewed.  Constitutional:      Appearance: She is well-developed.  HENT:     Head: Normocephalic.  Cardiovascular:     Rate and Rhythm: Normal rate.  Pulmonary:     Effort: Pulmonary effort is normal.  Neurological:     Mental Status: She is alert and oriented to person, place, and time.  Psychiatric:        Attention and Perception: Attention normal.        Mood  and Affect: Affect is labile and angry.        Speech: Speech is tangential.        Behavior: Behavior is cooperative.        Thought Content: Thought content is paranoid.        Cognition and Memory: Memory is impaired.        Judgment: Judgment is inappropriate.     Review of Systems  Constitutional: Negative.   HENT: Negative.   Eyes: Negative.   Respiratory: Negative.   Cardiovascular: Negative.   Gastrointestinal: Negative.   Genitourinary: Negative.   Musculoskeletal: Negative.   Skin:       Stage 4 pressure ulcer per record  Neurological: Negative.   Psychiatric/Behavioral: Positive for agitation, behavioral problems, confusion and dysphoric mood.    Blood pressure 139/87, pulse 71, temperature 98.2 F (36.8 C), temperature source Axillary, resp. rate 20, height  (1.676 m), weight 92.5 kg, SpO2 94 %.Body mass index is 32.91 kg/m.  General Appearance: Disheveled  Eye Contact:  Minimal  Speech:  Clear and Coherent  Volume:  Normal  Mood:  Irritable  Affect:  Labile  Thought Process:  Disorganized and Descriptions of Associations: Tangential  Orientation:  Other:  self, situation  Thought Content:  Delusions and Paranoid Ideation  Suicidal Thoughts:  No  Homicidal Thoughts:  No  Memory:   Immediate;   Poor Recent;   Poor Remote;   Poor  Judgement:  Impaired  Insight:  Lacking  Psychomotor Activity:  Normal  Concentration:  Concentration: Fair and Attention Span: Fair  Recall:  [employer] of Knowledge:  Fair  Language:  Fair  Akathisia:  No  Handed:  Right  AIMS (if indicated):     Assets:  Communication Skills Desire for Improvement Financial Resources/Insurance Housing  ADL's:  Impaired  Cognition:  Impaired,  Mild  Sleep:         Treatment Plan Summary: Daily contact with patient to assess and evaluate symptoms and progress in treatment   Continue to recommend inpatient geriatric psychiatric treatment.  Patrcia Dolly, FNP 05/12/2020, 11:21 AM

## 2020-05-12 NOTE — Progress Notes (Signed)
05/12/2020  Crushed meds and placed in applesauce. Patient took one spoon and spit the applesauce and meds at staff.

## 2020-05-13 LAB — RESPIRATORY PANEL BY RT PCR (FLU A&B, COVID)
Influenza A by PCR: NEGATIVE
Influenza B by PCR: NEGATIVE
SARS Coronavirus 2 by RT PCR: NEGATIVE

## 2020-05-13 LAB — CBG MONITORING, ED
Glucose-Capillary: 138 mg/dL — ABNORMAL HIGH (ref 70–99)
Glucose-Capillary: 171 mg/dL — ABNORMAL HIGH (ref 70–99)
Glucose-Capillary: 121 mg/dL — ABNORMAL HIGH (ref 70–99)

## 2020-05-13 MED ORDER — STERILE WATER FOR INJECTION IJ SOLN
INTRAMUSCULAR | Status: AC
  Administered 2020-05-13: 1 mL
  Filled 2020-05-13: qty 10

## 2020-05-13 NOTE — Progress Notes (Addendum)
TOC CSW contacted Nurse, mental health at [phone].  Claim was denied due to transport being over 50 miles.  CSW contacted [phone], opt. 3 gave authorization of [id number], reference # [id number].   CSW will leave handoff for 2nd shift CSW.  CSW will continue to follow for dc needs.  Becky Hendricks, MSW, LCSW-A Pronouns:  She, Her, Hers                  Becky Hendricks ED Transitions of CareClinical Social Worker [email or web address] [phone]

## 2020-05-13 NOTE — Progress Notes (Addendum)
2nd shift ED CSW received a handoff from the 1st shift [facility] ED CSW/Disposition.  The process is underway by [facility] (this Clinical research associate) now to pursue the correct auth # needed to pay for pt's transport to [facility] which is over 50 miles.   Per 1st shift Disposition, the pt's unit can be updated at the unit can be reached:  Ph: [phone] and at [phone].   4:31 PM CSW spoke to [facility] who now states that transportation over 50 miles does not require an authorization for this patient (but provided one anyway) and [employer] insisted [employer] should provide transportation.  CSW offered to call and let [employer] speak to [facility] and [facility] rep refused and stated [employer] could call [facility] and reference the  [phone] Code # listed under the authorization #  Reference # for the CSW's conversation is [id number].    4:47 PM [facility] at ph:[phone] who stated they will require a L.O.G..  CSW was told by [facility] Leadership  Per [facility], company policy requires a LOG, and that a ride can't be schedule and the only other option is for a credit card # to be provided by the family or [facility] and per CSW leadership pt has a payorand [facility] cannot provide a different payor.  4:49 PM CSW called [facility] at ph: [phone] who states they will talk to management and call the CSW back.  CSW called and spoke to intake at [facility] who states that per the NP on duty they will hold the pt's bed one more day but after that, "it is up for grabs" and that [facility] can call staffing at [phone] and (preferably) the NP is [phone] to provide updates.  CSW will continue to follow for D/C needs.  Dorothe Pea. Alegra Rost  MSW, LCSW, LCAS, CCS Transitions of Care Clinical Social Worker Care Coordination Department Ph: [phone]

## 2020-05-13 NOTE — ED Notes (Signed)
Entered patient room explaining to her she has some meds due, she initially agreed to take medication, went to retrieve them and patient then refused all meds, attempting to swat away nurse as I was giving IM alternate medication.

## 2020-05-13 NOTE — Progress Notes (Signed)
CSW called [employer] who stated transport ([facility]) from [place] to [facility] would be $1,625.  CSW called [facility] who stated that from [facility] ED to [facility] would be approx $450.28 per mile 15.26 mile (approx 170 miles) approx $3,000.  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  Pt's brother stated he would be willing to provide a credit card # but that if [facility] doesn't pay he would like to cancel the trip.  CSW voiced understanding and stated he would have [facility] call the pt's brother.  CSW called [facility] who state the brother would need to call them but that to tell pt's brother that if the ride is canceled tomorrow the pt's brother will not be charged and that if the trip is not canceled and the ambulance arrives then at that point the charge will be put on the pt's brother's card.  CSW called [facility] who will call mgt and call the CSW back.  CSW will continue to follow for D/C needs.  Dorothe Pea. Darrik Richman  MSW, LCSW, LCAS, CCS Transitions of Care Clinical Social Worker Care Coordination Department Ph: [phone]           [phone] from [place], [place].

## 2020-05-13 NOTE — Progress Notes (Addendum)
CSW spoke to [employer] from [facility] stating the patient has been offered a bed and has been accepted and that the pt can arrive on 05/13/20.  The pt's accepting doctor is Dr. Verdis Frederickson  The room number is TBD.  The number for report is [phone] (direct # to the geri unit) .  Shanda Bumps voiced understanding pt is schedule to be picked up from [facility] ED at approx 7:30-8pm and will arrive later in the evening to [facility].  OF NOTE: PT IS NOT TO BE TRANSPORTED UNTIL PT'S BROTHER IS NOTIFIED AND HAS APPROVED AS HIS CREDIT CARD WAS USED TO SECURE TRANSPORT WHILE ISSUES WITH [employer] MEDICARE ARE WORKED OUT AND TRIP WILL BE CANCELLED PRIOR TO TRANSPORT ARRIVING IF [employer] DOES NOT PAY.  PLEASE CALL SOCIAL WORK ON 05/14/20 TO CONFIRM.  TOC Leadership updated.  CSW will update PT'S RN.  Becky Hendricks. Becky Hendricks, Theresia Majors, LCAS Clinical Social Worker Ph: [phone]

## 2020-05-13 NOTE — ED Notes (Signed)
Pt confused, cooperative with care. Medication compliant, crushed medications.

## 2020-05-13 NOTE — Progress Notes (Signed)
CSW received a call from [facility] who states cost would be approx identical to [facility]'s cost (approx $3,000) which is cost-prohibitive.  CSW will continue to follow for D/C needs.  Dorothe Pea. Daneya Hartgrove  MSW, LCSW, LCAS, CCS Transitions of Care Clinical Social Worker Care Coordination Department Ph: [phone]

## 2020-05-13 NOTE — TOC Benefit Eligibility Note (Signed)
Transition of Care (TOC) Benefit Eligibility Note    Patient Details  Name: Becky Hendricks MRN: [id number] Date of Birth: 07/05/1946   Medication/Dose: Transportation Benefits- services are covered at 100% as deductible and out of pocket have been met. Company is [employer] (formerly [employer] [phone]  Covered?: Yes        Spoke with Person/Company/Phone Number:: [employer] - Jane - [phone], reference # [id number]  Co-Pay: 0  Prior Approval: No  Deductible: Met        L  Phone Number: 05/13/2020, 1:56 PM     

## 2020-05-13 NOTE — Progress Notes (Signed)
CSW called intake at [facility] who stated that if pt is not fully A&O on 10/2 and is not IVC'd and not voluntarily choosing to come to [facility] that [facility] would prefer pt be IVC'd.  Of note, pt is currently IVC'd.  CSW will continue to follow for D/C needs.  Dorothe Pea. Eugenia Eldredge  MSW, LCSW, LCAS, CCS Transitions of Care Clinical Social Worker Care Coordination Department Ph: [phone]

## 2020-05-13 NOTE — ED Notes (Signed)
Pt cooperative this AM. Pt confused, rambling

## 2020-05-13 NOTE — ED Notes (Signed)
Called [facility] to see about bed status and give report. They are going to figure out if bed is ready and call.

## 2020-05-13 NOTE — Progress Notes (Signed)
CSW spoke to pt's brother Nicolette, Gieske at ph: [phone] who stated he would provide the pt's brother's credit card # to [facility] at ph: [phone] and would call the dispatcher and provide his card as security.  6:40 PM Pt's brother confirmed with the CSW he has provided his credit card to [facility] for reasons of securing transport but does not want to actually pay if [facility] refuses to pay.  CSW [facility] who states they can provide transport at approx 7:30pm-8pm on Saturday (05-13-20).  Per [facility], [facility] requires one LEO in the transport and one following.  CSW will continue to follow for D/C needs.  Dorothe Pea. Abdoulie Tierce  MSW, LCSW, LCAS, CCS Transitions of Care Clinical Social Worker Care Coordination Department Ph: [phone]

## 2020-05-14 DIAGNOSIS — F29 Unspecified psychosis not due to a substance or known physiological condition: Secondary | ICD-10-CM | POA: Insufficient documentation

## 2020-05-14 DIAGNOSIS — F3160 Bipolar disorder, current episode mixed, unspecified: Secondary | ICD-10-CM

## 2020-05-14 NOTE — Progress Notes (Signed)
CSW called [facility] and asked them to check and see if the auth # provided to them by the CSW would allow them to transport the pt without charging the pt's brother's credit card and Vince in dispatch stated he would call the CSW back.  CSW will continue to follow for D/C needs.  Dorothe Pea. Salihah Peckham  MSW, LCSW, LCAS, CCS Transitions of Care Clinical Social Worker Care Coordination Department Ph: [phone]

## 2020-05-14 NOTE — ED Provider Notes (Signed)
Emergency Medicine Observation Re-evaluation Note  Becky Hendricks is a 74 y.o. female, seen on rounds today.  Pt initially presented to the ED for complaints of Psychiatric Evaluation Currently, the patient is awake, somewhat angry, complaining about blood being drawn (no blood is currently being drawn).  Physical Exam  BP 116/64 (BP Location: Right Arm)   Pulse 64   Temp 97.7 F (36.5 C) (Oral)   Resp 16   Ht 5\' 6"  (1.676 m)   Wt 92.5 kg   LMP  (LMP Unknown)   SpO2 92%   BMI 32.91 kg/m  Physical Exam General: well appearing Cardiac: normal rate Lungs: no increased work of breathing, no abnormal lung sounds Psych: calm  ED Course / MDM  EKG:EKG Interpretation  Date/Time:  Thursday May 05 2020 13:29:19 EDT Ventricular Rate:  69 PR Interval:    QRS Duration: 94 QT Interval:  377 QTC Calculation: 404 R Axis:   50 Text Interpretation: Sinus rhythm vs afib Atrial premature complexes in couplets Abnormal R-wave progression, early transition Nonspecific T abnrm, anterolateral leads Confirmed by 10-09-1994 [phone]) on 05/05/2020 1:31:22 PM    I have reviewed the labs performed to date as well as medications administered while in observation.  Recent changes in the last 24 hours include none.  Plan  Current plan is for awaiting inpatient placement. Patient is under full IVC at this time.   05/07/2020, MD 05/14/20 [phone]

## 2020-05-14 NOTE — Progress Notes (Signed)
CSW spoke to Maralyn Sago in intake at [facility] at ph: [phone] who states another referral can be sent on 05/15/20 but advised it would be best to call first on 10/3 as they will automatically decline if there are no beds available (as there are none today, sending another referral today will not be helpful).  Per Maralyn Sago pt's referral can be faxed to : [phone] if beds are available on 10/3.  ED CSW on 10/2 will leave handoff for 1st shift ED CSW who works on 10/3.  TOC Leadership updated.  CSW will continue to follow for D/C needs.  Dorothe Pea. Millena Callins  MSW, LCSW, LCAS, CCS Transitions of Care Clinical Social Worker Care Coordination Department Ph: [phone]

## 2020-05-14 NOTE — Progress Notes (Addendum)
CSW received a call from [facility] Deputy for IVC  Transport at ph: [phone]  stating the pt pick up cut-off time is 7pm and if later pt would have to go on Sunday (05/15/20), and CSW emphasized pt would lose his bed at the facility.  CSW appealed a second time to intake at [facility] to ask the psychiatrist to reconsider as the pt was not actually told the pt could come until 05/13/20.   Intake agrees pt should not have been "accepted" until the COVID test was received on 05/14/20.  CSW called [facility] who stated transport cannot take place until after 7:30pm.  Sheriff's Deputies were updated and will call the CSW/TCU back.   3:57 PM CSW received a call from [facility] stating after the CSW appealed a second time and the psychiatrist has still decided to withdraw the pt's bed.  CSW provided documentation over the phone that the pt was not officially accepted and a room # provided until 7pm last night (10/1) and that [facility] was told pt would be coming at 7:30pm tonight (10/2) an intake stated the psychiatrist is prioritizing the ED pt's there despite offering a bed to this pt.  [facility] updated and transport is being canceled and pt's brother per dispatcher Gloris Manchester will not be charged.  Sheriff's Deputy was updated and is canceling IVC transport proceedings.at ph:[phone].  Pt's brother was updated at [phone]  CSW will continue to follow for D/C needs.  Dorothe Pea. Samaiya Awadallah  MSW, LCSW, LCAS, CCS Transitions of Care Clinical Social Worker Care Coordination Department Ph: [phone]

## 2020-05-14 NOTE — ED Notes (Signed)
Patient is taking her medication pretty good.  She insist on telling a very long story first.  She is hyper focused on stories about sex.  She is eating and drinking well.

## 2020-05-14 NOTE — Progress Notes (Addendum)
CSW received a call from pt's RN stating Lyla Son from intake at [facility] in [place] stated they were "taking the pt's bed away".  CSW called [facility] and confirmed with Lyla Son at ph: [phone] that their provider states it has been 48 hours since pt was offered a bed and that other patients at the facility in their ED are there and need the bed at this time.  Per notes pt was accepted by [facility] on 05/12/20 at approx 4pm.  CSW will continue to follow for D/C needs.  Dorothe Pea. Lacey Dotson  MSW, LCSW, LCAS, CCS Transitions of Care Clinical Social Worker Care Coordination Department Ph: [phone]

## 2020-05-14 NOTE — Progress Notes (Signed)
CSW called [facility] who stated again that the auth # provided by the CSW would be utilized to request payment from [facility] post-transport but that the pt's brother's credit card would be charged and whatever amount [facility] would be willing to pay post-transport would be reimbursed by [facility] to the pt's brother.  [facility] dispatch was unable to provide cost of the ride after the initial 50 miles of the ride.  CSW will update pt's brother.  CSW will continue to follow for D/C needs.  Dorothe Pea. Tonya Carlile  MSW, LCSW, LCAS, CCS Transitions of Care Clinical Social Worker Care Coordination Department Ph: [phone]

## 2020-05-14 NOTE — ED Notes (Signed)
Carrie from Psych Intake at [facility] called and said that she regretfully has to cancel the bed offer as they have held it for 48 hours and since patient has not left yet they need to assign it to someone else.

## 2020-05-14 NOTE — ED Notes (Signed)
Patient has taken most of her scheduled meds today.   She is in no distress and has been cleaned and turned as needed.

## 2020-05-14 NOTE — Consult Note (Addendum)
Patient seen and staffed with Dr. Jannifer Franklin. She continues to be confused, disoriented and meets inpatient criteria.  She has been accepted at [facility] but awaiting transportation.  Spoke with York Grice, LCSW who is working to coordinate transfer.   Patient seen face-to-face for psychiatric evaluation, chart reviewed and case discussed with the physician extender and developed treatment plan. Reviewed the information documented and agree with the treatment plan. Thedore Mins, MD

## 2020-05-14 NOTE — Progress Notes (Signed)
CSW called the [employer] to request IVC transport and left a message with the desk of Patria Mane at ph: [phone] and did not get a call back.  CSW called non-emergency dispatch to request pt be assigned Sheriff's Deputies to go with pt who is under IVC ad was told all [place] transport will have to be arranged through the Sheriff's Dept at [phone] and per dispatch, dispatch is under the impression that this is ot done on the weekends.  CSW will continue to follow for D/C needs.  Dorothe Pea. Alexis Mizuno  MSW, LCSW, LCAS, CCS Transitions of Care Clinical Social Worker Care Coordination Department Ph: [phone]

## 2020-05-14 NOTE — Progress Notes (Signed)
CSW called [employer]'s call center at ph: [phone] but the call center was closed due it being a weekend.  CSW called [employer] who stated the auth # provided for the pt on 05/14/20 [id number]) was not sufficient as the ride would be approx 171+ miles and an "override" would be needed from [facility].  CSW will continue to follow for D/C needs.  Becky Hendricks. Shweta Aman  MSW, LCSW, LCAS, CCS Transitions of Care Clinical Social Worker Care Coordination Department Ph: [phone]

## 2020-05-15 LAB — CBG MONITORING, ED: Glucose-Capillary: 101 mg/dL — ABNORMAL HIGH (ref 70–99)

## 2020-05-15 MED ORDER — LORAZEPAM 1 MG PO TABS
1.0000 mg | ORAL_TABLET | Freq: Two times a day (BID) | ORAL | Status: DC | PRN
  Administered 2020-05-16: 1 mg via ORAL
  Filled 2020-05-15 (×2): qty 1

## 2020-05-15 MED ORDER — LORAZEPAM 2 MG/ML IJ SOLN
1.0000 mg | Freq: Two times a day (BID) | INTRAMUSCULAR | Status: DC | PRN
  Administered 2020-05-22 – 2020-05-24 (×2): 1 mg via INTRAMUSCULAR
  Filled 2020-05-15 (×2): qty 1

## 2020-05-15 MED ORDER — LORAZEPAM 2 MG/ML IJ SOLN: 1.0000 mg | Freq: Once | INTRAMUSCULAR | Status: DC

## 2020-05-15 NOTE — ED Notes (Signed)
Following other nurse success Waynetta Sandy) in giving pills the writer crushed them and put them in apple sauce. She slapped them out of this writer's hand. This Clinical research associate later took her ensure on ice and she took it and threw it against the wall.

## 2020-05-15 NOTE — Progress Notes (Addendum)
1:30pm: CSW spoke with Cherri at [facility] who states there is one bed available at this time - CSW sent additional clinic information to [phone] for review.  1pm: CSW attempted to reach admissions at [facility] - no answer.  CSW spoke with Corrie Dandy at [facility] - no beds available at this time.  12:pm: CSW spoke with admissions at [facility] - no beds available at this time.   CSW attempted to reach admissions at [facility] - no answer so a voicemail was left requesting a return call.  10am: CSW spoke with Lyla Son at [facility] [phone]) who states there are no appropriate beds available today. Lyla Son advised CSW to return call 10/4 to determine bed availability prior to sending an additional referral.  Edwin Dada, MSW, LCSW-A Transitions of Care  Clinical Social Worker  [facility] Emergency Departments  Medical ICU [phone]

## 2020-05-15 NOTE — ED Notes (Addendum)
Patient refused CBG. Patient stated that no one was getting her blood to 2 attempts from NT's

## 2020-05-15 NOTE — ED Notes (Signed)
Pt refused NT's attempt to check blood sugar. It is not actually ordered, but checking it because pt not eating.

## 2020-05-15 NOTE — ED Notes (Signed)
Patient has refused to have Vitals taken

## 2020-05-15 NOTE — ED Provider Notes (Signed)
Emergency Medicine Observation Re-evaluation Note  Becky Hendricks is a 73 y.o. female, seen on rounds today.  Pt initially presented to the ED for complaints of Psychiatric Evaluation Currently, the patient is sleeping.  Physical Exam  BP (!) 111/56 (BP Location: Left Arm)   Pulse 67   Temp 98.1 F (36.7 C) (Oral)   Resp 16   Ht 5\' 6"  (1.676 m)   Wt 92.5 kg   LMP  (LMP Unknown)   SpO2 92%   BMI 32.91 kg/m  Physical Exam General: NAD Cardiac: normal rate Lungs: no increased WOB, no abnormal lung sounds Psych: sleeping  ED Course / MDM  EKG:EKG Interpretation  Date/Time:  Thursday May 05 2020 13:29:19 EDT Ventricular Rate:  69 PR Interval:    QRS Duration: 94 QT Interval:  377 QTC Calculation: 404 R Axis:   50 Text Interpretation: Sinus rhythm vs afib Atrial premature complexes in couplets Abnormal R-wave progression, early transition Nonspecific T abnrm, anterolateral leads Confirmed by [birth year]1996 [phone]) on 05/05/2020 1:31:22 PM    I have reviewed the labs performed to date as well as medications administered while in observation.  Recent changes in the last 24 hours include none.  Plan  Current plan is for awaiting placement. Patient is under full IVC at this time.   05/07/2020, MD 05/15/20 1009

## 2020-05-15 NOTE — ED Notes (Signed)
Informed Ophelia Shoulder NP that pt did not eat or drink anything today and that she slapped the medications out of this writer's hands.

## 2020-05-15 NOTE — ED Notes (Signed)
Pt runs this Clinical research associate out of the room. Refused to have CBG checked. She is alert and yelling.

## 2020-05-15 NOTE — BH Assessment (Signed)
Ophelia Shoulder, NP, recommends continued inpatient treatment. Re faxed referrals to the following facilities for consideration of bed placement.  [facility]  [facility]  [facility]  [facility]  [facility]  [facility]  [facility]  [facility]  [facility]  [facility]

## 2020-05-15 NOTE — ED Notes (Signed)
In patient room for a total of ten minutes attempting to get through/reason with patient. Unable to get through or reason with her on anything. When I question her on a subject she speaks on, she changes subject to something completely random. Attempting to coax patient to take any of her meds and patient continues to say, she will take her meds after she gets her fish and chips.

## 2020-05-15 NOTE — ED Notes (Signed)
Patient watching tv, talking excessively to self

## 2020-05-15 NOTE — ED Notes (Signed)
Pt given bed bath. Pt allowed the intervention. Dressing on sacral wound changed with clean "Mepilex Border Sacrum" placed.  Pt given water.

## 2020-05-15 NOTE — ED Notes (Signed)
Spoke with provider. Will hold ativan as pt is not currently agitated. Will continued to monitor for increased aggression. Writer will attempt to encourage pt po intake.

## 2020-05-15 NOTE — ED Provider Notes (Signed)
Patient with increasing agitation and and at which take her medications.  Message sent to psychiatry who recommends Ativan and will relook at doing forced medications tomorrow   Lorre Nick, MD 05/15/20 1930

## 2020-05-15 NOTE — ED Notes (Signed)
Have requested Alginate dressing for wound from materials.

## 2020-05-15 NOTE — ED Notes (Signed)
Patients dinner tray delivered/ Patient refused

## 2020-05-16 ENCOUNTER — Encounter (HOSPITAL_COMMUNITY): Payer: Self-pay | Admitting: Registered Nurse

## 2020-05-16 MED ORDER — STERILE WATER FOR INJECTION IJ SOLN
INTRAMUSCULAR | Status: AC
  Administered 2020-05-16: 2.1 mL
  Filled 2020-05-16: qty 10

## 2020-05-16 NOTE — ED Notes (Signed)
Patient was requesting ice cream, Attempted to mix patient meds in ice cream, Patient ate 2 spoons of ice cream and stated "this taste like blood pressure medicine".

## 2020-05-16 NOTE — ED Notes (Signed)
Patient became tearful and asking to why she is here, explained to patient the rationale and mention the med regimen she is suppose to be on, at this time patient states bring the medications and I will take them, patient given all meds that were available at this time

## 2020-05-16 NOTE — ED Notes (Signed)
Patient cleaned and turned .  Patient is in no distress.  She took her afternoon medications without difficulty.

## 2020-05-16 NOTE — Consult Note (Signed)
Telepsych Consultation   Reason for Consult:  Psychosis Referring Physician:  EDP Location of Patient: XI33 Location of Provider: [facility]  Patient Identification: Becky Hendricks MRN:  [id number] Principal Diagnosis: Bipolar disorder [facility]) Diagnosis:  Principal Problem:   Bipolar disorder (HCC)  HPI:  Per EDP Report: 74 year old female presents from her nursing facility with altered mental status. The provider sent her out because of psychosis, unclear if it is her psychiatric disease versus infection. A urinalysis was reportedly negative. No fevers. History is primarily taken from Becky Hendricks is the Publishing copy. Thinks she has maybe had some recent medication changes but is not sure what. Has a longstanding psychiatric history. The patient talks to me like I have seen her often before, which to my knowledge I have not. She also talks about how she needs to go to the maternity ward as she is pregnant and has been pregnant since 1965.   Total Time spent with patient: 15 minutes   Psychiatric Progress Note:  05/16/2020:   Becky Hendricks, 74 y.o., female patient seen via tele psych by this provider, consulted with Dr. Lucianne Muss; and chart reviewed on 05/16/20.  On evaluation Becky Hendricks is laying in bed with gown off shoulders but bed covers pulled up over breast.  Patient continues to have episodes of auditory/visual hallucinations.  Patient does appear to be doing better; she is able to answer some questions today.  She was able to state that she has taking some medication but then began to ramble.  Patient is aware that she is in the hospital.  Spoke with nursing who reports that patient talks none stop and patient will take her medications from certain people. Patient continue to need inpatient psychiatric treatment.  Psychiatric Assessment: 9/298/2021 Subjective:   Becky Hendricks, 74 y.o., female patient seen via tele psych by this provider, consulted  with consulted with Dr. Lucianne Muss; and chart reviewed on 05/16/20.  On evaluation Becky Hendricks is laying in bed.  Patient is observed responding visual and auditory hallucinations related to her looking at something and talking to someone who is not there.  She did wave at tele prompt machine with this provider said hello to her but she would not answer any questions she just kept looking/talking to something not seen by this provider.  Patient continues to be disorganized, delusional and tangential.  Patient also continues to refuse her medications.  Depakote changed to sprinkles so that it can be added to food when given.  Will change Risperdal and Cogentin to PO or IM.  There may be a need for second opinion if patient continues to refuse her medications.   CSW to continue seeking higher level care.    Past Psychiatric History:  See above  Risk to Self: Suicidal Ideation: No Suicidal Intent: No Is patient at risk for suicide?: No Suicidal Plan?: No Access to Means: No What has been your use of drugs/alcohol within the last 12 months?: none How many times?: 0 Other Self Harm Risks: dementia Triggers for Past Attempts: None known Intentional Self Injurious Behavior: None Risk to Others: Homicidal Ideation: No Thoughts of Harm to Others: No Current Homicidal Intent: No-Not Currently/Within Last 6 Months Current Homicidal Plan: No Access to Homicidal Means: No Identified Victim: none History of harm to others?: No Assessment of Violence: None Noted Violent Behavior Description: none reported Does patient have access to weapons?: No Criminal Charges Pending?: No Does patient have a court date: No Prior Inpatient Therapy: Prior Inpatient Therapy:  (  UTA) Prior Outpatient Therapy: Prior Outpatient Therapy:  (UTA)  Past Medical History:  Past Medical History:  Diagnosis Date  . Atrial fibrillation (HCC)   . Bipolar disorder (HCC)   . Bipolar disorder (HCC) 04/27/2020  . Clostridium  difficile diarrhea 08/02/2019  . Dehydration   . Essential hypertension   . Fall 02/11/2020  . Morbid obesity (HCC)   . Type 2 diabetes mellitus (HCC)   . Weakness     Past Surgical History:  Procedure Laterality Date  . INCISION AND DRAINAGE PERIRECTAL ABSCESS Left 12/04/2019   Procedure: IRRIGATION AND DEBRIDEMENT LEFT BUTTOCK ABSCESS;  Surgeon: Violeta Gelinashompson, Burke, MD;  [facility];  Service: General;  Laterality: Left;  . INCISION AND DRAINAGE PERIRECTAL ABSCESS Left 12/10/2019   Procedure: REPEAT IRRIGATION AND DEBRIDEMENT BUTTOCK  ABSCESS;  Surgeon: Abigail MiyamotoBlackman, Douglas, MD;  Location: [facility] OR;  Service: General;  Laterality: Left;   Family History:  Family History  Family history unknown: Yes   Family Psychiatric  History:  Social History:  Social History   Substance and Sexual Activity  Alcohol Use Not Currently     Social History   Substance and Sexual Activity  Drug Use Not Currently    Social History   Socioeconomic History  . Marital status: Single    Spouse name: Not on file  . Number of children: Not on file  . Years of education: Not on file  . Highest education level: Not on file  Occupational History  . Not on file  Tobacco Use  . Smoking status: Never Smoker  . Smokeless tobacco: Never Used  Vaping Use  . Vaping Use: Unknown  Substance and Sexual Activity  . Alcohol use: Not Currently  . Drug use: Not Currently  . Sexual activity: Not Currently    Birth control/protection: Post-menopausal  Other Topics Concern  . Not on file  Social History Narrative  . Not on file   Social Determinants of Health   Financial Resource Strain:   . Difficulty of Paying Living Expenses: Not on file  Food Insecurity:   . Worried About Programme researcher, broadcasting/film/videounning Out of Food in the Last Year: Not on file  . Ran Out of Food in the Last Year: Not on file  Transportation Needs:   . Lack of Transportation (Medical): Not on file  . Lack of Transportation (Non-Medical): Not on file   Physical Activity:   . Days of Exercise per Week: Not on file  . Minutes of Exercise per Session: Not on file  Stress:   . Feeling of Stress : Not on file  Social Connections:   . Frequency of Communication with Friends and Family: Not on file  . Frequency of Social Gatherings with Friends and Family: Not on file  . Attends Religious Services: Not on file  . Active Member of Clubs or Organizations: Not on file  . Attends BankerClub or Organization Meetings: Not on file  . Marital Status: Not on file   Additional Social History:    Allergies:   Allergies  Allergen Reactions  . Chlorhexidine Gluconate Itching  . Acetazolamide Er Rash    Labs:  Results for orders placed or performed during the hospital encounter of 05/05/20 (from the past 48 hour(s))  CBG monitoring, ED     Status: Abnormal   Collection Time: 05/15/20 10:34 AM  Result Value Ref Range   Glucose-Capillary 101 (H) 70 - 99 mg/dL    Comment: Glucose reference range applies only to samples taken after fasting for  at least 8 hours.    Medications:  Current Facility-Administered Medications  Medication Dose Route Frequency Provider Last Rate Last Admin  . acetaminophen (TYLENOL) tablet 650 mg  650 mg Oral Q6H PRN Pricilla Loveless, MD      . apixaban Everlene Balls) tablet 5 mg  5 mg Oral BID Pricilla Loveless, MD   5 mg at 05/16/20 1202  . ascorbic acid (VITAMIN C) tablet 500 mg  500 mg Oral BID Pricilla Loveless, MD   500 mg at 05/16/20 1202  . atorvastatin (LIPITOR) tablet 10 mg  10 mg Oral QHS Pricilla Loveless, MD   10 mg at 05/16/20 0317  . calcium carbonate (TUMS - dosed in mg elemental calcium) chewable tablet 200 mg of elemental calcium  1 tablet Oral Daily Pricilla Loveless, MD   200 mg of elemental calcium at 05/14/20 1133  . carvedilol (COREG) tablet 12.5 mg  12.5 mg Oral BID WC Pricilla Loveless, MD   12.5 mg at 05/16/20 0842  . diphenhydrAMINE-zinc acetate (BENADRYL) 2-0.1 % cream 1 application  1 application Topical TID PRN  Pricilla Loveless, MD      . divalproex (DEPAKOTE SPRINKLE) capsule 250 mg  250 mg Oral TID Khiley Lieser B, NP   250 mg at 05/16/20 1201  . docusate sodium (COLACE) capsule 100 mg  100 mg Oral BID PRN Pricilla Loveless, MD      . famotidine (PEPCID) tablet 20 mg  20 mg Oral BID Pricilla Loveless, MD   20 mg at 05/16/20 1203  . feeding supplement (PRO-STAT SUGAR FREE 64) liquid 30 mL  30 mL Oral BID Pricilla Loveless, MD   30 mL at 05/06/20 1011  . furosemide (LASIX) tablet 40 mg  40 mg Oral Daily Pricilla Loveless, MD   40 mg at 05/16/20 1203  . hydrALAZINE (APRESOLINE) tablet 25 mg  25 mg Oral Q8H Pricilla Loveless, MD   25 mg at 05/16/20 0316  . insulin glargine (LANTUS) injection 10 Units  10 Units Subcutaneous QHS Pricilla Loveless, MD      . Ipratropium-Albuterol (COMBIVENT) respimat 1 puff  1 puff Inhalation Q6H PRN Lorre Nick, MD      . linagliptin (TRADJENTA) tablet 5 mg  5 mg Oral Q breakfast Pricilla Loveless, MD   5 mg at 05/16/20 0843   And  . metFORMIN (GLUCOPHAGE) tablet 1,000 mg  1,000 mg Oral Q breakfast Pricilla Loveless, MD   1,000 mg at 05/16/20 0843  . LORazepam (ATIVAN) tablet 1 mg  1 mg Oral Q12H PRN Ophelia Shoulder E, NP   1 mg at 05/16/20 0028   Or  . LORazepam (ATIVAN) injection 1 mg  1 mg Intramuscular Q12H PRN Ophelia Shoulder E, NP      . losartan (COZAAR) tablet 50 mg  50 mg Oral Daily Pricilla Loveless, MD   50 mg at 05/16/20 1202  . metFORMIN (GLUCOPHAGE) tablet 1,000 mg  1,000 mg Oral Q supper Pricilla Loveless, MD   1,000 mg at 05/14/20 1859  . multivitamin with minerals tablet 1 tablet  1 tablet Oral Daily Pricilla Loveless, MD   1 tablet at 05/16/20 1203  . nutrition supplement (JUVEN) (JUVEN) powder packet 1 packet  1 packet Oral BID BM Pricilla Loveless, MD   1 packet at 05/11/20 1415  . OLANZapine (ZYPREXA) injection 5 mg  5 mg Intramuscular QHS Nwoko, Uchenna E, PA   5 mg at 05/13/20 2222   Or  . OLANZapine zydis (ZYPREXA) disintegrating tablet 5 mg  5 mg Oral  QHS Nwoko, Uchenna E,  PA   5 mg at 05/16/20 0314  . pantoprazole (PROTONIX) EC tablet 40 mg  40 mg Oral BID Pricilla Loveless, MD   40 mg at 05/16/20 1203  . protein supplement (ENSURE MAX) liquid  11 oz Oral BID Pricilla Loveless, MD   11 oz at 05/15/20 1043  . senna-docusate (Senokot-S) tablet 1 tablet  1 tablet Oral Daily Pricilla Loveless, MD   1 tablet at 05/15/20 1042  . zinc sulfate capsule 220 mg  220 mg Oral Daily Pricilla Loveless, MD   220 mg at 05/16/20 1202   Current Outpatient Medications  Medication Sig Dispense Refill  . Amino Acids-Protein Hydrolys (FEEDING SUPPLEMENT, PRO-STAT SUGAR FREE 64,) LIQD Take 30 mLs by mouth 2 (two) times daily. 887 mL 0  . apixaban (ELIQUIS) 5 MG TABS tablet Take 5 mg by mouth 2 (two) times daily.    Marland Kitchen ascorbic acid (VITAMIN C) 500 MG tablet Take 500 mg by mouth daily.     Marland Kitchen atorvastatin (LIPITOR) 10 MG tablet Take 10 mg by mouth at bedtime.    . calcium carbonate (TUMS - DOSED IN MG ELEMENTAL CALCIUM) 500 MG chewable tablet Chew 1 tablet by mouth daily.    . carvedilol (COREG) 25 MG tablet Take 12.5 mg by mouth 2 (two) times daily with a meal.    . Cholecalciferol (VITAMIN D) 125 MCG (5000 UT) CAPS Take 10,000 Units by mouth once a week. Wednesday    . divalproex (DEPAKOTE SPRINKLE) 125 MG capsule Take 125 mg by mouth daily. Every afternoon.    . divalproex (DEPAKOTE) 500 MG DR tablet Take 500 mg by mouth 2 (two) times daily.    . Emollient (EUCERIN INTENSIVE REPAIR) LOTN Apply 1 application topically daily. To both knees for rash.    . furosemide (LASIX) 40 MG tablet Take 40 mg by mouth daily.     . hydrALAZINE (APRESOLINE) 25 MG tablet Take 1 tablet (25 mg total) by mouth every 8 (eight) hours.    . hydrocortisone cream 1 % Apply topically 2 (two) times daily. (Patient taking differently: Apply 1 application topically 2 (two) times daily. ) 30 g 0  . insulin glargine (LANTUS) 100 UNIT/ML injection Inject 10 Units into the skin at bedtime. Notify PEC for CBG less than 100 or  greater than 250.    Marland Kitchen Ipratropium-Albuterol (COMBIVENT RESPIMAT) 20-100 MCG/ACT AERS respimat Inhale 1 puff into the lungs every 6 (six) hours.    Marland Kitchen LORazepam (ATIVAN) 0.5 MG tablet Take 0.5 mg by mouth 2 (two) times daily as needed for anxiety.    Marland Kitchen losartan (COZAAR) 25 MG tablet Take 50 mg by mouth daily.    . Multiple Vitamin (MULTIVITAMIN WITH MINERALS) TABS tablet Take 1 tablet by mouth daily.    . NON FORMULARY Take 120 mLs by mouth daily. Medpass to promote healing    . nutrition supplement, JUVEN, (JUVEN) PACK Take 1 packet by mouth 2 (two) times daily between meals.  0  . pantoprazole (PROTONIX) 40 MG tablet Take 1 tablet (40 mg total) by mouth 2 (two) times daily. (Patient taking differently: Take 40 mg by mouth daily. ) 30 tablet 0  . sennosides-docusate sodium (SENOKOT-S) 8.6-50 MG tablet Take 1 tablet by mouth daily.    . sitaGLIPtin-metformin (JANUMET) 50-1000 MG tablet Take 1 tablet by mouth 2 (two) times daily with a meal.    . zinc sulfate 220 (50 Zn) MG capsule Take 220 mg by mouth daily.    Marland Kitchen  acetaminophen (TYLENOL) 325 MG tablet Take 650 mg by mouth every 6 (six) hours as needed for mild pain or headache.    . diphenhydrAMINE (BENADRYL) 2 % cream Apply 1 application topically 3 (three) times daily as needed for itching.    . docusate sodium (COLACE) 100 MG capsule Take 1 capsule (100 mg total) by mouth 2 (two) times daily as needed for mild constipation. 10 capsule 0  . Ensure Max Protein (ENSURE MAX PROTEIN) LIQD Take 330 mLs (11 oz total) by mouth 2 (two) times daily. (Patient not taking: Reported on 05/05/2020)      Musculoskeletal:   Psychiatric Specialty Exam: Physical Exam Vitals and nursing note reviewed. Exam conducted with a chaperone present.  Pulmonary:     Effort: Pulmonary effort is normal.  Neurological:     Mental Status: She is alert.  Psychiatric:        Attention and Perception: She is inattentive. She perceives auditory and visual hallucinations.         Mood and Affect: Affect is labile.        Speech: Speech normal.        Thought Content: Thought content is delusional.        Cognition and Memory: Cognition is impaired (Related to acuity of her current condition). Memory is impaired (Unable to assess at this time).        Judgment: Judgment is impulsive.     Review of Systems  Unable to perform ROS: Acuity of condition (Patient unable to answer questions related acuity of her condition)    Blood pressure (!) 125/59, pulse (!) 58, temperature 97.7 F (36.5 C), temperature source Axillary, resp. rate 15, height  (1.676 m), weight 92.5 kg, SpO2 96 %.Body mass index is 32.91 kg/m.  General Appearance: Casual   Eye Contact:  Minimal  Speech:  Clear and Coherent and Pressured  Volume:  Normal  Mood:  Anxious and Irritable  Affect:  Labile  Thought Process:  Disorganized, Irrelevant and Descriptions of Associations: Tangential  Orientation:  Other:  Self  Thought Content:  Hallucinations: Auditory Visual and Paranoid Ideation   Suicidal Thoughts:  No  Homicidal Thoughts:  No  Memory:  Unable to assess related to current condiditon  Judgement:  Impaired  Insight:  Shallow  Psychomotor Activity:  Restlessness  Concentration:  Concentration: Poor  Recall:  Poor  Fund of Knowledge:  Poor  Language:  Fair  Akathisia:  No  Handed:  Right  AIMS (if indicated):     Assets:  Communication Skills Desire for Improvement Resilience Social Support  ADL's:  Impaired  Currently needs assistance related to acuity of condition  Cognition:  WNL  Sleep:       Disposition: Recommend psychiatric Inpatient admission when medically cleared.  Medication changes 05/11/11 Depakote changed to Depakote sprinkles 250 mg Tid Discontinues Zyprexa Zydis 5 mg related to obesity and diabetes Started Risperdal 1 mg Q hs with Cogentin 0.5 mg Q hs for psychosis, and EPS Valproic acid ordered and EKG ordered Labs reviewed:  Valproic acid level 32  below therapeutic level  Continue with current treatment plan.  Patient continues to need inpatient psychiatric treatment.    This service was provided via telemedicine using a 2-way, interactive audio and video technology.  Names of all persons participating in this telemedicine service and their role in this encounter. Name: Becky Hendricks Role: patient   Name: Dr. Lucianne Muss Role: Psychiatrist  Name: Assunta Found Role: NP  Sael Furches, NP 05/16/2020 12:08 PM

## 2020-05-16 NOTE — ED Provider Notes (Signed)
Emergency Medicine Observation Re-evaluation Note  Becky Hendricks is a 74 y.o. female, seen on rounds today.  Pt initially presented to the ED for complaints of Psychiatric Evaluation Currently, the patient is awaiting placement, resting in bed.  Physical Exam  BP (!) 125/59 (BP Location: Left Arm)   Pulse (!) 58   Temp 97.7 F (36.5 C) (Axillary)   Resp 15   Ht 5\' 6"  (1.676 m)   Wt 92.5 kg   LMP  (LMP Unknown)   SpO2 96%   BMI 32.91 kg/m  Physical Exam General: NAD Cardiac: normal rate Lungs: no increased WOB, no abnormal lung sounds Psych: sleeping  ED Course / MDM  EKG:EKG Interpretation  Date/Time:  Thursday May 05 2020 13:29:19 EDT Ventricular Rate:  69 PR Interval:    QRS Duration: 94 QT Interval:  377 QTC Calculation: 404 R Axis:   50 Text Interpretation: Sinus rhythm vs afib Atrial premature complexes in couplets Abnormal R-wave progression, early transition Nonspecific T abnrm, anterolateral leads Confirmed by 10-09-1996 [phone]) on 05/05/2020 1:31:22 PM    I have reviewed the labs performed to date as well as medications administered while in observation.  Recent changes in the last 24 hours include agitated yesterday.  Plan  Current plan is for in patient placement per psych. Patient is under full IVC at this time.   05/07/2020, MD 05/16/20 [phone]

## 2020-05-16 NOTE — Progress Notes (Addendum)
@   11:40 am TOC CSW attempted to contact Cherri at [facility] [phone] who stated to CSW over the weekend that pts information was under review. There was no answer, message that caller was not available.  @ 11:44 am TOC CSW attempted to contact [facility] [phone], CSW was redirected to [facility] at [phone].    CSW will continue to follow for dc needs.  [name] Wicklund Tarpley-Carter, MSW, LCSW-A Pronouns:  She, Her, Hers                  [facility] Transitions of CareClinical Social Worker [email or web address] [phone]

## 2020-05-16 NOTE — BH Assessment (Signed)
BHH Assessment Progress Note  Per Shuvon Rankin, NP, this involuntary pt continues to require psychiatric hospitalization at this time.  Pt has been referred once again to [facility].  At 16:12 Beth reports that they have received the referral, but do not have beds available at this time.  This Clinical research associate will continue to seek placement.   Doylene Canning, [place] Behavioral Health Coordinator [phone]

## 2020-05-17 LAB — CBG MONITORING, ED
Glucose-Capillary: 86 mg/dL (ref 70–99)
Glucose-Capillary: 174 mg/dL — ABNORMAL HIGH (ref 70–99)
Glucose-Capillary: 98 mg/dL (ref 70–99)
Glucose-Capillary: 152 mg/dL — ABNORMAL HIGH (ref 70–99)

## 2020-05-17 NOTE — BH Assessment (Addendum)
BHH Assessment Progress Note  Per Nelly Rout, MD, this involuntary pt continues to require psychiatric hospitalization at this time.  At 10:15 this writer called [facility] to check on referral status.  I spoke to Adrianne.  She reports that they received referral packet that I sent yesterday, 05/16/2020, but that their geriatric psychiatry unit is currently at capacity and that they have three potential patients in their ED.  I will continue to seek placement for pt.  Please note that current IVC will expire tomorrow, 05/18/2020.  Doylene Canning, [place] Behavioral Health Coordinator [phone]   Addendum:  Christiane Ha at [facility] reports that they currently have a Med Psych bed available and agree to review pt for consideration.  Referral packet is in the process of being faxed to them as of this writing.  Doylene Canning, [place] Behavioral Health Coordinator [phone]   Addendum:  Pt has also been referred to [facility]  Awaiting response from all three facilities.  Doylene Canning, [place] Behavioral Health Coordinator [phone]

## 2020-05-17 NOTE — ED Notes (Signed)
Pt confused. Rambling. Loose association. No s/s of distress. Cooperative with care  Pt refused AM medication. Medication compliant this afternoon.   Sitter at bedside

## 2020-05-18 ENCOUNTER — Encounter (HOSPITAL_COMMUNITY): Payer: Self-pay | Admitting: Psychiatry

## 2020-05-18 LAB — CBG MONITORING, ED: Glucose-Capillary: 139 mg/dL — ABNORMAL HIGH (ref 70–99)

## 2020-05-18 NOTE — ED Notes (Signed)
Pt is allowing her sitter to care for her, turn her, etc.

## 2020-05-18 NOTE — ED Notes (Signed)
Pt refuses medications this afternoon stating, "I took a pile of pills this morning and that is all that I am taking."

## 2020-05-18 NOTE — ED Provider Notes (Signed)
Patient's IVC has expired. Patient still with acute psychiatric disease and needs inpatient care. I have renewed her IVC.   Pricilla Loveless, MD 05/18/20 [phone]

## 2020-05-18 NOTE — ED Notes (Signed)
On approach pt states, "Don't come in here Doris Day."  She said that she will not take medications.  Response has been the same with this Clinical research associate every day that this Clinical research associate has this pt.

## 2020-05-18 NOTE — BH Assessment (Signed)
BHH Assessment Progress Note  Per Marciano Sequin, NP, this pt continues to require psychiatric hospitalization at this time.  The following facilities have been contacted to seek placement for this pt, with results as noted:  Beds available, information sent, decision pending: [facility]  At capacity: [facility]  Unable to reach: [facility] (left message at 11:08)  Newly declined: [facility] (due to medical acuity) [facility] (due to medical acuity) [facility] (due to medical acuity)  Previously declined [facility] (due to medical acuity) [facility] (due to medical acuity) [facility] (due to medical acuity) [facility] (due to medical acuity) [facility] (due to medical acuity) [facility] (due to medical acuity)   At 14:54 Amy calls from [facility].  She will staff pt with their provider, but wants to know pt's live circumstances.  Pt's record lists an address in [place], but no local setting.  At 15:44 I called pt's brother, Dari Carpenito [phone]).  He reports that pt lived independently until sustaining a fall recently.  He reports finding her two days later.  This seems to have precipitated decompensation.  Pt then went to [facility], where she has been ever since (please see FYI for details).  York Grice, CSW provided me with contacts at [facility]: Everardo Pacific [phone]) and Fritzi Mandes [phone]).  At 15:51 I called [place].  She confirms that pt was at their facility, and that she will be permitted to return once she is psychiatrically stabilized.  This has been reported to Amy.  Their decision is pending as of this writing.    Doylene Canning, [place] Behavioral Health Coordinator [phone]

## 2020-05-18 NOTE — ED Notes (Signed)
BP 88/57. Pt not symptomatic. Alert.

## 2020-05-18 NOTE — ED Notes (Signed)
Pt is friendly with sitter today and is eating breakfast.

## 2020-05-18 NOTE — ED Notes (Signed)
According to report from night-shift nurse, Junior Rimando, RN, dressing change on sacral wound was done after pt had a BM during night shift. He reported that she took medication from him and was drinking water.

## 2020-05-18 NOTE — Progress Notes (Addendum)
CSW received a call from [place] from [facility] at ph: [phone] extension [phone] called to find out out if the 1st shift ED CSW was available and was informed the CSW (this Clinical research associate) could assist with ED pt's.  Josephine asked that the 1st shift ED CSW Ricquita call to confirm that the outpatient level of care code is [phone].  CSW provided the pt's EPD's # so [employer] could seek the EPD's N.P.I. # for insurance coverage needs at [phone].  CSW will continue to follow for D/C needs.  Dorothe Pea. Aliesha Dolata  MSW, LCSW, LCAS, CCS Transitions of Care Clinical Social Worker Care Coordination Department Ph: [phone]

## 2020-05-18 NOTE — Consult Note (Signed)
Telepsych Consultation   Reason for Consult:  Psychosis  Referring Physician:  EDP Location of Patient: [id number] Location of Provider: [facility]  Patient Identification: Becky Hendricks MRN:  [id number] Principal Diagnosis: Bipolar disorder [facility]) Diagnosis:  Principal Problem:   Bipolar disorder (HCC)   Total Time spent with patient: 20 minutes  Subjective:   Becky Hendricks is a 74 y.o. female patient admitted 05/05/2020 from her nursing facility with psychosis. Pt was seen via tele psych by this provider, consulted with consulted with Dr. Lucianne Muss; and chart reviewed on 05/18/20.  On evaluation Becky Hendricks is laying in bed.  Patient alert and engaged in assessment; awake and oriented to person and current events Research scientist (physical sciences) is Armed forces technical officer). Pt able to engage and answer questions. Pt voiced several delusions, stating "all of these nurses are jealous of me", "we went back to 1972", and "these people all steal".  As assessment progressed pt became more tangential and circumstantial mentioning her care and needing to leave to.  Patient is currently taking her medications. CSW to continue seeking higher level care.    HPI:  Per EDP Report: 74 year old female presents from her nursing facility with altered mental status. The provider sent her out because of psychosis, unclear if it is her psychiatric disease versus infection. A urinalysis was reportedly negative. No fevers. History is primarily taken from [employer] is the Publishing copy. Thinks she has maybe had some recent medication changes but is not sure what. Has a longstanding psychiatric history.  The patient talks to me like I have seen her often before, which to my knowledge I have not. She also talks about how she needs to go to the maternity ward as she is pregnant and has been pregnant since 1965.  Past Psychiatric History:   Risk to Self: Suicidal Ideation: No Suicidal Intent: No Is patient at risk for suicide?: No Suicidal  Plan?: No Access to Means: No What has been your use of drugs/alcohol within the last 12 months?: none How many times?: 0 Other Self Harm Risks: dementia Triggers for Past Attempts: None known Intentional Self Injurious Behavior: None Risk to Others: Homicidal Ideation: No Thoughts of Harm to Others: No Current Homicidal Intent: No-Not Currently/Within Last 6 Months Current Homicidal Plan: No Access to Homicidal Means: No Identified Victim: none History of harm to others?: No Assessment of Violence: None Noted Violent Behavior Description: none reported Does patient have access to weapons?: No Criminal Charges Pending?: No Does patient have a court date: No Prior Inpatient Therapy: Prior Inpatient Therapy:  (UTA) Prior Outpatient Therapy: Prior Outpatient Therapy:  (UTA)  Past Medical History:  Past Medical History:  Diagnosis Date  . Atrial fibrillation (HCC)   . Bipolar disorder (HCC)   . Bipolar disorder (HCC) 04/27/2020  . Clostridium difficile diarrhea 08/02/2019  . Dehydration   . Essential hypertension   . Fall 02/11/2020  . Morbid obesity (HCC)   . Type 2 diabetes mellitus (HCC)   . Weakness     Past Surgical History:  Procedure Laterality Date  . INCISION AND DRAINAGE PERIRECTAL ABSCESS Left 12/04/2019   Procedure: IRRIGATION AND DEBRIDEMENT LEFT BUTTOCK ABSCESS;  Surgeon: Violeta Gelinas, MD;  Location: [facility] OR;  Service: General;  Laterality: Left;  . INCISION AND DRAINAGE PERIRECTAL ABSCESS Left 12/10/2019   Procedure: REPEAT IRRIGATION AND DEBRIDEMENT BUTTOCK  ABSCESS;  Surgeon: Abigail Miyamoto, MD;  Location: [facility] OR;  Service: General;  Laterality: Left;   Family History:  Family History  Family history unknown: Yes  Family Psychiatric  History: not noted Social History:  Social History   Substance and Sexual Activity  Alcohol Use Not Currently     Social History   Substance and Sexual Activity  Drug Use Not Currently    Social History    Socioeconomic History  . Marital status: Single    Spouse name: Not on file  . Number of children: Not on file  . Years of education: Not on file  . Highest education level: Not on file  Occupational History  . Not on file  Tobacco Use  . Smoking status: Never Smoker  . Smokeless tobacco: Never Used  Vaping Use  . Vaping Use: Unknown  Substance and Sexual Activity  . Alcohol use: Not Currently  . Drug use: Not Currently  . Sexual activity: Not Currently    Birth control/protection: Post-menopausal  Other Topics Concern  . Not on file  Social History Narrative  . Not on file   Social Determinants of Health   Financial Resource Strain:   . Difficulty of Paying Living Expenses: Not on file  Food Insecurity:   . Worried About Programme researcher, broadcasting/film/videounning Out of Food in the Last Year: Not on file  . Ran Out of Food in the Last Year: Not on file  Transportation Needs:   . Lack of Transportation (Medical): Not on file  . Lack of Transportation (Non-Medical): Not on file  Physical Activity:   . Days of Exercise per Week: Not on file  . Minutes of Exercise per Session: Not on file  Stress:   . Feeling of Stress : Not on file  Social Connections:   . Frequency of Communication with Friends and Family: Not on file  . Frequency of Social Gatherings with Friends and Family: Not on file  . Attends Religious Services: Not on file  . Active Member of Clubs or Organizations: Not on file  . Attends BankerClub or Organization Meetings: Not on file  . Marital Status: Not on file   Additional Social History:    Allergies:   Allergies  Allergen Reactions  . Chlorhexidine Gluconate Itching  . Acetazolamide Er Rash    Labs:  Results for orders placed or performed during the hospital encounter of 05/05/20 (from the past 48 hour(s))  CBG monitoring, ED     Status: None   Collection Time: 05/17/20  7:24 AM  Result Value Ref Range   Glucose-Capillary 86 70 - 99 mg/dL    Comment: Glucose reference range  applies only to samples taken after fasting for at least 8 hours.  CBG monitoring, ED     Status: Abnormal   Collection Time: 05/17/20 12:21 PM  Result Value Ref Range   Glucose-Capillary 174 (H) 70 - 99 mg/dL    Comment: Glucose reference range applies only to samples taken after fasting for at least 8 hours.  CBG monitoring, ED     Status: None   Collection Time: 05/17/20  4:59 PM  Result Value Ref Range   Glucose-Capillary 98 70 - 99 mg/dL    Comment: Glucose reference range applies only to samples taken after fasting for at least 8 hours.  CBG monitoring, ED     Status: Abnormal   Collection Time: 05/17/20  9:55 PM  Result Value Ref Range   Glucose-Capillary 152 (H) 70 - 99 mg/dL    Comment: Glucose reference range applies only to samples taken after fasting for at least 8 hours.   Comment 1 Notify RN    Comment  2 Document in Chart   CBG monitoring, ED     Status: Abnormal   Collection Time: 05/18/20  7:31 AM  Result Value Ref Range   Glucose-Capillary 139 (H) 70 - 99 mg/dL    Comment: Glucose reference range applies only to samples taken after fasting for at least 8 hours.    Medications:  Current Facility-Administered Medications  Medication Dose Route Frequency Provider Last Rate Last Admin  . acetaminophen (TYLENOL) tablet 650 mg  650 mg Oral Q6H PRN Pricilla Loveless, MD      . apixaban Everlene Balls) tablet 5 mg  5 mg Oral BID Pricilla Loveless, MD   5 mg at 05/18/20 1025  . ascorbic acid (VITAMIN C) tablet 500 mg  500 mg Oral BID Pricilla Loveless, MD   500 mg at 05/18/20 1025  . atorvastatin (LIPITOR) tablet 10 mg  10 mg Oral QHS Pricilla Loveless, MD   10 mg at 05/17/20 2135  . calcium carbonate (TUMS - dosed in mg elemental calcium) chewable tablet 200 mg of elemental calcium  1 tablet Oral Daily Pricilla Loveless, MD   200 mg of elemental calcium at 05/18/20 1023  . carvedilol (COREG) tablet 12.5 mg  12.5 mg Oral BID WC Pricilla Loveless, MD   12.5 mg at 05/18/20 1023  .  diphenhydrAMINE-zinc acetate (BENADRYL) 2-0.1 % cream 1 application  1 application Topical TID PRN Pricilla Loveless, MD      . divalproex (DEPAKOTE SPRINKLE) capsule 250 mg  250 mg Oral TID Rankin, Shuvon B, NP   250 mg at 05/18/20 1023  . docusate sodium (COLACE) capsule 100 mg  100 mg Oral BID PRN Pricilla Loveless, MD      . famotidine (PEPCID) tablet 20 mg  20 mg Oral BID Pricilla Loveless, MD   20 mg at 05/18/20 1023  . feeding supplement (PRO-STAT SUGAR FREE 64) liquid 30 mL  30 mL Oral BID Pricilla Loveless, MD   30 mL at 05/06/20 1011  . furosemide (LASIX) tablet 40 mg  40 mg Oral Daily Pricilla Loveless, MD   40 mg at 05/18/20 1024  . hydrALAZINE (APRESOLINE) tablet 25 mg  25 mg Oral Q8H Pricilla Loveless, MD   25 mg at 05/17/20 2135  . insulin glargine (LANTUS) injection 10 Units  10 Units Subcutaneous QHS Pricilla Loveless, MD   10 Units at 05/17/20 2137  . Ipratropium-Albuterol (COMBIVENT) respimat 1 puff  1 puff Inhalation Q6H PRN Lorre Nick, MD      . linagliptin (TRADJENTA) tablet 5 mg  5 mg Oral Q breakfast Pricilla Loveless, MD   5 mg at 05/18/20 1025   And  . metFORMIN (GLUCOPHAGE) tablet 1,000 mg  1,000 mg Oral Q breakfast Pricilla Loveless, MD   1,000 mg at 05/18/20 1028  . LORazepam (ATIVAN) tablet 1 mg  1 mg Oral Q12H PRN Ophelia Shoulder E, NP   1 mg at 05/16/20 0028   Or  . LORazepam (ATIVAN) injection 1 mg  1 mg Intramuscular Q12H PRN Ophelia Shoulder E, NP      . losartan (COZAAR) tablet 50 mg  50 mg Oral Daily Pricilla Loveless, MD   50 mg at 05/18/20 1024  . metFORMIN (GLUCOPHAGE) tablet 1,000 mg  1,000 mg Oral Q supper Pricilla Loveless, MD   1,000 mg at 05/17/20 1758  . multivitamin with minerals tablet 1 tablet  1 tablet Oral Daily Pricilla Loveless, MD   1 tablet at 05/18/20 1024  . nutrition supplement (JUVEN) (JUVEN) powder packet 1 packet  1 packet Oral BID BM Pricilla Loveless, MD   1 packet at 05/11/20 1415  . OLANZapine (ZYPREXA) injection 5 mg  5 mg Intramuscular QHS Nwoko, Uchenna E,  PA   5 mg at 05/16/20 2125   Or  . OLANZapine zydis (ZYPREXA) disintegrating tablet 5 mg  5 mg Oral QHS Nwoko, Uchenna E, PA   5 mg at 05/17/20 2135  . pantoprazole (PROTONIX) EC tablet 40 mg  40 mg Oral BID Pricilla Loveless, MD   40 mg at 05/18/20 1023  . protein supplement (ENSURE MAX) liquid  11 oz Oral BID Pricilla Loveless, MD   11 oz at 05/15/20 1043  . senna-docusate (Senokot-S) tablet 1 tablet  1 tablet Oral Daily Pricilla Loveless, MD   1 tablet at 05/18/20 1024  . zinc sulfate capsule 220 mg  220 mg Oral Daily Pricilla Loveless, MD   220 mg at 05/18/20 1024   Current Outpatient Medications  Medication Sig Dispense Refill  . Amino Acids-Protein Hydrolys (FEEDING SUPPLEMENT, PRO-STAT SUGAR FREE 64,) LIQD Take 30 mLs by mouth 2 (two) times daily. 887 mL 0  . apixaban (ELIQUIS) 5 MG TABS tablet Take 5 mg by mouth 2 (two) times daily.    Marland Kitchen ascorbic acid (VITAMIN C) 500 MG tablet Take 500 mg by mouth daily.     Marland Kitchen atorvastatin (LIPITOR) 10 MG tablet Take 10 mg by mouth at bedtime.    . calcium carbonate (TUMS - DOSED IN MG ELEMENTAL CALCIUM) 500 MG chewable tablet Chew 1 tablet by mouth daily.    . carvedilol (COREG) 25 MG tablet Take 12.5 mg by mouth 2 (two) times daily with a meal.    . Cholecalciferol (VITAMIN D) 125 MCG (5000 UT) CAPS Take 10,000 Units by mouth once a week. Wednesday    . divalproex (DEPAKOTE SPRINKLE) 125 MG capsule Take 125 mg by mouth daily. Every afternoon.    . divalproex (DEPAKOTE) 500 MG DR tablet Take 500 mg by mouth 2 (two) times daily.    . Emollient (EUCERIN INTENSIVE REPAIR) LOTN Apply 1 application topically daily. To both knees for rash.    . furosemide (LASIX) 40 MG tablet Take 40 mg by mouth daily.     . hydrALAZINE (APRESOLINE) 25 MG tablet Take 1 tablet (25 mg total) by mouth every 8 (eight) hours.    . hydrocortisone cream 1 % Apply topically 2 (two) times daily. (Patient taking differently: Apply 1 application topically 2 (two) times daily. ) 30 g 0  .  insulin glargine (LANTUS) 100 UNIT/ML injection Inject 10 Units into the skin at bedtime. Notify PEC for CBG less than 100 or greater than 250.    Marland Kitchen Ipratropium-Albuterol (COMBIVENT RESPIMAT) 20-100 MCG/ACT AERS respimat Inhale 1 puff into the lungs every 6 (six) hours.    Marland Kitchen LORazepam (ATIVAN) 0.5 MG tablet Take 0.5 mg by mouth 2 (two) times daily as needed for anxiety.    Marland Kitchen losartan (COZAAR) 25 MG tablet Take 50 mg by mouth daily.    . Multiple Vitamin (MULTIVITAMIN WITH MINERALS) TABS tablet Take 1 tablet by mouth daily.    . NON FORMULARY Take 120 mLs by mouth daily. Medpass to promote healing    . nutrition supplement, JUVEN, (JUVEN) PACK Take 1 packet by mouth 2 (two) times daily between meals.  0  . pantoprazole (PROTONIX) 40 MG tablet Take 1 tablet (40 mg total) by mouth 2 (two) times daily. (Patient taking differently: Take 40 mg by mouth daily. ) 30 tablet  0  . sennosides-docusate sodium (SENOKOT-S) 8.6-50 MG tablet Take 1 tablet by mouth daily.    . sitaGLIPtin-metformin (JANUMET) 50-1000 MG tablet Take 1 tablet by mouth 2 (two) times daily with a meal.    . zinc sulfate 220 (50 Zn) MG capsule Take 220 mg by mouth daily.    Marland Kitchen acetaminophen (TYLENOL) 325 MG tablet Take 650 mg by mouth every 6 (six) hours as needed for mild pain or headache.    . diphenhydrAMINE (BENADRYL) 2 % cream Apply 1 application topically 3 (three) times daily as needed for itching.    . docusate sodium (COLACE) 100 MG capsule Take 1 capsule (100 mg total) by mouth 2 (two) times daily as needed for mild constipation. 10 capsule 0  . Ensure Max Protein (ENSURE MAX PROTEIN) LIQD Take 330 mLs (11 oz total) by mouth 2 (two) times daily. (Patient not taking: Reported on 05/05/2020)      Musculoskeletal: Strength & Muscle Tone: abnormal Gait & Station: unable to stand Patient leans: N/A  Psychiatric Specialty Exam: Physical Exam Vitals and nursing note reviewed.  Psychiatric:     Comments: Pt is awake, alert, and  oriented to person, time, and current events (current president). Pt remains disorganized and illogical in thought. Pt continues to verbalize several delusions "nurses are jealous of me" "your husband came in here last night to take care of me" Poor judgement.      Review of Systems  Psychiatric/Behavioral: Positive for agitation and confusion. Decreased concentration:    All other systems reviewed and are negative.   Blood pressure 94/67, pulse 79, temperature 98.3 F (36.8 C), temperature source Oral, resp. rate 18, height  (1.676 m), weight 92.5 kg, SpO2 96 %.Body mass index is 32.91 kg/m.  General Appearance: Casual  Eye Contact:  Good  Speech:  Normal Rate  Volume:  Normal  Mood:  Dysphoric  Affect:  Blunt  Thought Process:  Disorganized  Orientation:  Other:  disoriented to month, day, and place "[place], [place]"  Thought Content:  Illogical, Delusions and Tangential  Suicidal Thoughts:  No  Homicidal Thoughts:  No  Memory:  Immediate;   Poor  Judgement:  Impaired  Insight:  Lacking  Psychomotor Activity:  Normal  Concentration:  Concentration: Fair  Recall:  Poor  Fund of Knowledge:  unknown due to level of delusions  Language:  Good  Akathisia:  No  Handed:  Right  AIMS (if indicated):     Assets:  Communication Skills Resilience  ADL's:  Impaired  Cognition:  WNL  Sleep:        Treatment Plan Summary: Daily contact with patient to assess and evaluate symptoms and progress in treatment, Medication management and Plan Pt continues to need inpatient psychiatric treatment.   Disposition: Recommend psychiatric Inpatient admission when medically cleared.   Recommend psychiatric Inpatient admission when medically cleared.   Continue with current treatment plan.  Patient continues to need inpatient psychiatric treatment.    This service was provided via telemedicine using a 2-way, interactive audio and video technology.  Names of all persons participating in this  telemedicine service and their role in this encounter. Name: Melondy Blanchard Role: patient  Name: Maxie Barb Role: NP  Name: Nelly Rout Role: Attending Physician  Name:  Role:     Loletta Parish, NP 05/18/2020 4:47 PM

## 2020-05-18 NOTE — ED Notes (Signed)
Pt requested her medication, (which is a great improvement) and took most of them. Continues to call this writer "Doris Day".  Seems to know that this writer is not really Doris Day as she says, "You look like doris day".

## 2020-05-18 NOTE — ED Notes (Signed)
Pt ate breakfast and lunch today. Refused dinner tray but said that she might eat it later.  Staff have kept her hygiene clean and repositioned in the bed.

## 2020-05-19 LAB — CBG MONITORING, ED: Glucose-Capillary: 109 mg/dL — ABNORMAL HIGH (ref 70–99)

## 2020-05-19 NOTE — Consult Note (Signed)
[facility]  05/19/2020 10:33 AM Becky Hendricks  MRN:  [id number] Subjective: Patient states "you know I do not trust the people with dark hair I prefer blondes."  Patient appears to be referring to dark haired nurse at bedside.  Patient states to nurse "you know why I feel the way that I do."  Patient appears to have paranoid delusion at this time.  Patient alert to self and date/time currently.  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  Patient cooperative during assessment.  Patient continues to report apparent paranoid delusions.  Patient continues to deny suicidal ideations.  Patient denies any history of self-harm behaviors or suicide attempts.  Patient continues to deny homicidal ideations.  Patient does not appear to be responding to internal stimuli at this time.  Patient endorses average sleep.  Patient endorses decreased appetite x2 weeks.  Patient states "because I have not drink and smoke during my lifetime I am now able to lose weight."  Patient presents with delusional thought content.  Patient believes this Clinical research associatewriter is "one of the sisters."  Patient initially states "are you my birth mother?"  Patient easily redirectable at this time.  Discussed importance of medication compliance, patient verbalizes understanding. Patient offered support and encouragement.  Principal Problem: Bipolar disorder (HCC) Diagnosis:  Principal Problem:   Bipolar disorder (HCC)  Total Time spent with patient: 20 minutes  Past Psychiatric History: Bipolar disorder, psychosis Past Medical History:  Past Medical History:  Diagnosis Date   Atrial fibrillation (HCC)    Bipolar disorder (HCC)    Bipolar disorder (HCC) 04/27/2020   Clostridium difficile diarrhea 08/02/2019   Dehydration    Essential hypertension    Fall 02/11/2020   Morbid obesity (HCC)    Type 2 diabetes  mellitus (HCC)    Weakness     Past Surgical History:  Procedure Laterality Date   INCISION AND DRAINAGE PERIRECTAL ABSCESS Left 12/04/2019   Procedure: IRRIGATION AND DEBRIDEMENT LEFT BUTTOCK ABSCESS;  Surgeon: Violeta Gelinashompson, Burke, MD;  [facility];  Service: General;  Laterality: Left;   INCISION AND DRAINAGE PERIRECTAL ABSCESS Left 12/10/2019   Procedure: REPEAT IRRIGATION AND DEBRIDEMENT BUTTOCK  ABSCESS;  Surgeon: Abigail MiyamotoBlackman, Douglas, MD;  Location: [facility] OR;  Service: General;  Laterality: Left;   Family History:  Family History  Family history unknown: Yes   Family Psychiatric  History: None reported Social History:  Social History   Substance and Sexual Activity  Alcohol Use Not Currently     Social History   Substance and Sexual Activity  Drug Use Not Currently    Social History   Socioeconomic History   Marital status: Single    Spouse name: Not on file   Number of children: Not on file   Years of education: Not on file   Highest education level: Not on file  Occupational History   Not on file  Tobacco Use   Smoking status: Never Smoker   Smokeless tobacco: Never Used  Vaping Use   Vaping Use: Unknown  Substance and Sexual Activity   Alcohol use: Not Currently   Drug use: Not Currently   Sexual activity: Not Currently    Birth control/protection: Post-menopausal  Other Topics Concern   Not on file  Social History Narrative   Not on file   Social Determinants of Health   Financial Resource Strain:    Difficulty of Paying Living Expenses: Not  on file  Food Insecurity:    Worried About Programme researcher, broadcasting/film/video in the Last Year: Not on file   [employer] of Food in the Last Year: Not on file  Transportation Needs:    Lack of Transportation (Medical): Not on file   Lack of Transportation (Non-Medical): Not on file  Physical Activity:    Days of Exercise per Week: Not on file   Minutes of Exercise per Session: Not on file  Stress:     Feeling of Stress : Not on file  Social Connections:    Frequency of Communication with Friends and Family: Not on file   Frequency of Social Gatherings with Friends and Family: Not on file   Attends Religious Services: Not on file   Active Member of Clubs or Organizations: Not on file   Attends Banker Meetings: Not on file   Marital Status: Not on file    Sleep: Fair  Appetite:  Poor  Current Medications: Current Facility-Administered Medications  Medication Dose Route Frequency Provider Last Rate Last Admin   acetaminophen (TYLENOL) tablet 650 mg  650 mg Oral Q6H PRN Pricilla Loveless, MD       apixaban (ELIQUIS) tablet 5 mg  5 mg Oral BID Pricilla Loveless, MD   5 mg at 05/19/20 1024   ascorbic acid (VITAMIN C) tablet 500 mg  500 mg Oral BID Pricilla Loveless, MD   500 mg at 05/19/20 1025   atorvastatin (LIPITOR) tablet 10 mg  10 mg Oral QHS Pricilla Loveless, MD   10 mg at 05/17/20 2135   calcium carbonate (TUMS - dosed in mg elemental calcium) chewable tablet 200 mg of elemental calcium  1 tablet Oral Daily Pricilla Loveless, MD   200 mg of elemental calcium at 05/19/20 1023   carvedilol (COREG) tablet 12.5 mg  12.5 mg Oral BID WC Pricilla Loveless, MD   12.5 mg at 05/19/20 1024   diphenhydrAMINE-zinc acetate (BENADRYL) 2-0.1 % cream 1 application  1 application Topical TID PRN Pricilla Loveless, MD       divalproex (DEPAKOTE SPRINKLE) capsule 250 mg  250 mg Oral TID Rankin, Shuvon B, NP   250 mg at 05/19/20 1023   docusate sodium (COLACE) capsule 100 mg  100 mg Oral BID PRN Pricilla Loveless, MD       famotidine (PEPCID) tablet 20 mg  20 mg Oral BID Pricilla Loveless, MD   20 mg at 05/19/20 1025   feeding supplement (PRO-STAT SUGAR FREE 64) liquid 30 mL  30 mL Oral BID Pricilla Loveless, MD   30 mL at 05/06/20 1011   furosemide (LASIX) tablet 40 mg  40 mg Oral Daily Pricilla Loveless, MD   40 mg at 05/19/20 1023   hydrALAZINE (APRESOLINE) tablet 25 mg  25 mg Oral Q8H  Pricilla Loveless, MD   25 mg at 05/17/20 2135   insulin glargine (LANTUS) injection 10 Units  10 Units Subcutaneous QHS Pricilla Loveless, MD   10 Units at 05/17/20 2137   Ipratropium-Albuterol (COMBIVENT) respimat 1 puff  1 puff Inhalation Q6H PRN Lorre Nick, MD       linagliptin (TRADJENTA) tablet 5 mg  5 mg Oral Q breakfast Pricilla Loveless, MD   5 mg at 05/19/20 1024   And   metFORMIN (GLUCOPHAGE) tablet 1,000 mg  1,000 mg Oral Q breakfast Pricilla Loveless, MD   1,000 mg at 05/19/20 1023   LORazepam (ATIVAN) tablet 1 mg  1 mg Oral Q12H PRN Chales Abrahams, NP  1 mg at 05/16/20 0028   Or   LORazepam (ATIVAN) injection 1 mg  1 mg Intramuscular Q12H PRN Chales Abrahams, NP       losartan (COZAAR) tablet 50 mg  50 mg Oral Daily Pricilla Loveless, MD   50 mg at 05/19/20 1023   metFORMIN (GLUCOPHAGE) tablet 1,000 mg  1,000 mg Oral Q supper Pricilla Loveless, MD   1,000 mg at 05/17/20 1758   multivitamin with minerals tablet 1 tablet  1 tablet Oral Daily Pricilla Loveless, MD   1 tablet at 05/19/20 1023   nutrition supplement (JUVEN) (JUVEN) powder packet 1 packet  1 packet Oral BID BM Pricilla Loveless, MD   1 packet at 05/11/20 1415   OLANZapine (ZYPREXA) injection 5 mg  5 mg Intramuscular QHS Nwoko, Uchenna E, PA   5 mg at 05/16/20 2125   Or   OLANZapine zydis (ZYPREXA) disintegrating tablet 5 mg  5 mg Oral QHS Nwoko, Uchenna E, PA   5 mg at 05/17/20 2135   pantoprazole (PROTONIX) EC tablet 40 mg  40 mg Oral BID Pricilla Loveless, MD   40 mg at 05/19/20 1029   protein supplement (ENSURE MAX) liquid  11 oz Oral BID Pricilla Loveless, MD   11 oz at 05/15/20 1043   senna-docusate (Senokot-S) tablet 1 tablet  1 tablet Oral Daily Pricilla Loveless, MD   1 tablet at 05/19/20 1023   zinc sulfate capsule 220 mg  220 mg Oral Daily Pricilla Loveless, MD   220 mg at 05/19/20 1022   Current Outpatient Medications  Medication Sig Dispense Refill   Amino Acids-Protein Hydrolys (FEEDING SUPPLEMENT, PRO-STAT  SUGAR FREE 64,) LIQD Take 30 mLs by mouth 2 (two) times daily. 887 mL 0   apixaban (ELIQUIS) 5 MG TABS tablet Take 5 mg by mouth 2 (two) times daily.     ascorbic acid (VITAMIN C) 500 MG tablet Take 500 mg by mouth daily.      atorvastatin (LIPITOR) 10 MG tablet Take 10 mg by mouth at bedtime.     calcium carbonate (TUMS - DOSED IN MG ELEMENTAL CALCIUM) 500 MG chewable tablet Chew 1 tablet by mouth daily.     carvedilol (COREG) 25 MG tablet Take 12.5 mg by mouth 2 (two) times daily with a meal.     Cholecalciferol (VITAMIN D) 125 MCG (5000 UT) CAPS Take 10,000 Units by mouth once a week. Wednesday     divalproex (DEPAKOTE SPRINKLE) 125 MG capsule Take 125 mg by mouth daily. Every afternoon.     divalproex (DEPAKOTE) 500 MG DR tablet Take 500 mg by mouth 2 (two) times daily.     Emollient (EUCERIN INTENSIVE REPAIR) LOTN Apply 1 application topically daily. To both knees for rash.     furosemide (LASIX) 40 MG tablet Take 40 mg by mouth daily.      hydrALAZINE (APRESOLINE) 25 MG tablet Take 1 tablet (25 mg total) by mouth every 8 (eight) hours.     hydrocortisone cream 1 % Apply topically 2 (two) times daily. (Patient taking differently: Apply 1 application topically 2 (two) times daily. ) 30 g 0   insulin glargine (LANTUS) 100 UNIT/ML injection Inject 10 Units into the skin at bedtime. Notify PEC for CBG less than 100 or greater than 250.     Ipratropium-Albuterol (COMBIVENT RESPIMAT) 20-100 MCG/ACT AERS respimat Inhale 1 puff into the lungs every 6 (six) hours.     LORazepam (ATIVAN) 0.5 MG tablet Take 0.5 mg by mouth 2 (two)  times daily as needed for anxiety.     losartan (COZAAR) 25 MG tablet Take 50 mg by mouth daily.     Multiple Vitamin (MULTIVITAMIN WITH MINERALS) TABS tablet Take 1 tablet by mouth daily.     NON FORMULARY Take 120 mLs by mouth daily. Medpass to promote healing     nutrition supplement, JUVEN, (JUVEN) PACK Take 1 packet by mouth 2 (two) times daily between  meals.  0   pantoprazole (PROTONIX) 40 MG tablet Take 1 tablet (40 mg total) by mouth 2 (two) times daily. (Patient taking differently: Take 40 mg by mouth daily. ) 30 tablet 0   sennosides-docusate sodium (SENOKOT-S) 8.6-50 MG tablet Take 1 tablet by mouth daily.     sitaGLIPtin-metformin (JANUMET) 50-1000 MG tablet Take 1 tablet by mouth 2 (two) times daily with a meal.     zinc sulfate 220 (50 Zn) MG capsule Take 220 mg by mouth daily.     acetaminophen (TYLENOL) 325 MG tablet Take 650 mg by mouth every 6 (six) hours as needed for mild pain or headache.     diphenhydrAMINE (BENADRYL) 2 % cream Apply 1 application topically 3 (three) times daily as needed for itching.     docusate sodium (COLACE) 100 MG capsule Take 1 capsule (100 mg total) by mouth 2 (two) times daily as needed for mild constipation. 10 capsule 0   Ensure Max Protein (ENSURE MAX PROTEIN) LIQD Take 330 mLs (11 oz total) by mouth 2 (two) times daily. (Patient not taking: Reported on 05/05/2020)      Lab Results:  Results for orders placed or performed during the hospital encounter of 05/05/20 (from the past 48 hour(s))  CBG monitoring, ED     Status: Abnormal   Collection Time: 05/17/20 12:21 PM  Result Value Ref Range   Glucose-Capillary 174 (H) 70 - 99 mg/dL    Comment: Glucose reference range applies only to samples taken after fasting for at least 8 hours.  CBG monitoring, ED     Status: None   Collection Time: 05/17/20  4:59 PM  Result Value Ref Range   Glucose-Capillary 98 70 - 99 mg/dL    Comment: Glucose reference range applies only to samples taken after fasting for at least 8 hours.  CBG monitoring, ED     Status: Abnormal   Collection Time: 05/17/20  9:55 PM  Result Value Ref Range   Glucose-Capillary 152 (H) 70 - 99 mg/dL    Comment: Glucose reference range applies only to samples taken after fasting for at least 8 hours.   Comment 1 Notify RN    Comment 2 Document in Chart   CBG monitoring, ED      Status: Abnormal   Collection Time: 05/18/20  7:31 AM  Result Value Ref Range   Glucose-Capillary 139 (H) 70 - 99 mg/dL    Comment: Glucose reference range applies only to samples taken after fasting for at least 8 hours.  CBG monitoring, ED     Status: Abnormal   Collection Time: 05/19/20  8:04 AM  Result Value Ref Range   Glucose-Capillary 109 (H) 70 - 99 mg/dL    Comment: Glucose reference range applies only to samples taken after fasting for at least 8 hours.    Blood Alcohol level:  Lab Results  Component Value Date   ETH <10 05/05/2020   ETH <10 02/01/2020    Physical Findings: AIMS:  , ,  ,  ,    CIWA:  COWS:     Musculoskeletal: Strength & Muscle Tone: decreased Gait & Station: Unable to assess Patient leans: N/A  Psychiatric Specialty Exam: Physical Exam Vitals and nursing note reviewed.  Constitutional:      Appearance: She is well-developed.  HENT:     Head: Normocephalic.  Cardiovascular:     Rate and Rhythm: Normal rate.  Pulmonary:     Effort: Pulmonary effort is normal.  Neurological:     Mental Status: She is alert and oriented to person, place, and time.  Psychiatric:        Attention and Perception: Attention normal.        Mood and Affect: Affect is inappropriate.        Speech: Speech is tangential.        Behavior: Behavior is cooperative.        Thought Content: Thought content is delusional.        Cognition and Memory: Cognition is impaired. Memory is impaired.        Judgment: Judgment is inappropriate.     Review of Systems  Constitutional: Negative.   HENT: Negative.   Eyes: Negative.   Respiratory: Negative.   Cardiovascular: Negative.   Gastrointestinal: Negative.   Genitourinary: Negative.   Musculoskeletal: Negative.   Skin: Negative.   Neurological: Negative.   Psychiatric/Behavioral: Positive for confusion.    Blood pressure 108/70, pulse 83, temperature 98.5 F (36.9 C), temperature source Axillary, resp. rate 18,  height  (1.676 m), weight 92.5 kg, SpO2 90 %.Body mass index is 32.91 kg/m.  General Appearance: Casual  Eye Contact:  Fair  Speech:  Normal Rate  Volume:  Normal  Mood:  Dysphoric  Affect:  Non-Congruent  Thought Process:  Disorganized and Descriptions of Associations: Tangential  Orientation:  Other:  self, time  Thought Content:  Delusions and Tangential  Suicidal Thoughts:  No  Homicidal Thoughts:  No  Memory:  Immediate;   Poor Recent;   Poor Remote;   Poor  Judgement:  Impaired  Insight:  Lacking  Psychomotor Activity:  Normal  Concentration:  Concentration: Fair and Attention Span: Fair  Recall:  Poor  Fund of Knowledge:  Fair  Language:  Fair  Akathisia:  No  Handed:  Right  AIMS (if indicated):     Assets:  Communication Skills Desire for Improvement Financial Resources/Insurance Housing Resilience  ADL's:  Impaired  Cognition:  Impaired,  Moderate  Sleep:         Treatment Plan Summary: Daily contact with patient to assess and evaluate symptoms and progress in treatment  Patient reviewed with Dr. Nelly Rout.  Continue to recommend geriatric inpatient psychiatric treatment.  Patrcia Dolly, FNP 05/19/2020, 10:33 AM

## 2020-05-19 NOTE — ED Provider Notes (Signed)
Emergency Medicine Observation Re-evaluation Note  Becky Hendricks is a 74 y.o. female, seen on rounds today.  Pt initially presented to the ED for complaints of Psychiatric Evaluation Currently, the patient is resting comfortably.  Physical Exam  BP 108/70 (BP Location: Left Arm)   Pulse 83   Temp 98.5 F (36.9 C) (Axillary)   Resp 18   Ht 1.676 m (5\' 6" )   Wt 92.5 kg   LMP  (LMP Unknown)   SpO2 90%   BMI 32.91 kg/m  Physical Exam General: Calm and cooperative Cardiac: Regular Lungs: Clear Psych: Calm.  Not responding to internal stimuli.  Cooperative  ED Course / MDM  EKG:EKG Interpretation  Date/Time:  Thursday May 05 2020 13:29:19 EDT Ventricular Rate:  69 PR Interval:    QRS Duration: 94 QT Interval:  377 QTC Calculation: 404 R Axis:   50 Text Interpretation: Sinus rhythm vs afib Atrial premature complexes in couplets Abnormal R-wave progression, early transition Nonspecific T abnrm, anterolateral leads Confirmed by 10-09-1996 [id number]) on 05/05/2020 1:31:22 PM    I have reviewed the labs performed to date as well as medications administered while in observation.  Recent changes in the last 24 hours include still awaiting placement.  Plan  Current plan is for placement. Patient is under full IVC at this time.   05/07/2020, MD 05/19/20 1030

## 2020-05-19 NOTE — BH Assessment (Signed)
[facility] Assessment Progress Note  Per Berneice Heinrich, NP, this pt continues to require psychiatric hospitalization at this time.  The following facilities have been contacted to seek placement for this pt, with results as noted:  At capacity: [facility] (no anticipated discharges until Thursday, 05/26/20)  Unit currently closed: [facility]  Newly declined: [facility] (due to being total care) [place] (due to medical acuity) [facility] (no longer offers inpatient geriatric psychiatry)  Previously declined [facility] (due to medical acuity) [facility] (due to medical acuity) [facility] (due to medical acuity) [facility] (due to medical acuity) [facility] (due to medical acuity) [facility] (due to medical acuity) [facility] (due to medical acuity) [place] (due to medical acuity) [facility] (due to medical acuity)   Doylene Canning, MA Behavioral Health Coordinator [phone]

## 2020-05-19 NOTE — ED Notes (Signed)
Pt medication compliant this shift.  

## 2020-05-19 NOTE — ED Notes (Signed)
Wound dressing changed.

## 2020-05-19 NOTE — ED Notes (Signed)
Pt cooperative with care. Confused . No s/s of distress.

## 2020-05-19 NOTE — Progress Notes (Signed)
CSW received a call from Gae V. From [employer] called to provide pt's authorization # for Procedure Code 56256 with the diagnosis of F23.   Berkley Harvey # is:  [id number] Date of Service is 05/13/2020  CSW will continue to follow for D/C needs.  Dorothe Pea. Tahira Olivarez  MSW, LCSW, LCAS, CCS Transitions of Care Clinical Social Worker Care Coordination Department Ph: [phone]

## 2020-05-20 LAB — CBG MONITORING, ED: Glucose-Capillary: 128 mg/dL — ABNORMAL HIGH (ref 70–99)

## 2020-05-20 MED ORDER — OLANZAPINE 10 MG PO TBDP
10.0000 mg | ORAL_TABLET | Freq: Every day | ORAL | Status: DC
  Administered 2020-05-20 – 2020-05-24 (×3): 10 mg via ORAL
  Filled 2020-05-20 (×4): qty 1

## 2020-05-20 MED ORDER — OLANZAPINE 10 MG IM SOLR: 10.0000 mg | Freq: Every day | INTRAMUSCULAR | Status: DC

## 2020-05-20 NOTE — Consult Note (Signed)
[facility] Psych ED Progress Note  05/20/2020 9:29 AM Becky Hendricks  MRN:  [id number]   Subjective: Patient continues to be tangential in conversation.  Patient states "look at the names, look at the people, they do not know everything when they are born, you are related to me and you have a good brother."  Patient assessed by nurse practitioner.  Patient alert to self and time only currently.  Patient disoriented to situation.  Patient pleasant cooperative during assessment.  Patient tangential in conversation at times states "I will do so from the 60s and there is a tremendous change taking place this is the beginning of [place] and anyone can go out with anyone."  Patient behavior continues to appear to be improving, spoke with attending RN.  Patient appears to be compliant with medications.  Patient offered support and encouragement.  Attempted to discuss medication changes.  No adverse effects reported to any medications at this time.      Principal Problem: Bipolar disorder (HCC) Diagnosis:  Principal Problem:   Bipolar disorder (HCC)  Total Time spent with patient: 30 minutes  Past Psychiatric History: Bipolar 1 disorder, psychosis  Past Medical History:  Past Medical History:  Diagnosis Date  . Atrial fibrillation (HCC)   . Bipolar disorder (HCC)   . Bipolar disorder (HCC) 04/27/2020  . Clostridium difficile diarrhea 08/02/2019  . Dehydration   . Essential hypertension   . Fall 02/11/2020  . Morbid obesity (HCC)   . Type 2 diabetes mellitus (HCC)   . Weakness     Past Surgical History:  Procedure Laterality Date  . INCISION AND DRAINAGE PERIRECTAL ABSCESS Left 12/04/2019   Procedure: IRRIGATION AND DEBRIDEMENT LEFT BUTTOCK ABSCESS;  Surgeon: Violeta Gelinas, MD;  Location: [facility] OR;  Service: General;  Laterality: Left;  . INCISION AND DRAINAGE PERIRECTAL ABSCESS Left 12/10/2019   Procedure: REPEAT IRRIGATION AND DEBRIDEMENT BUTTOCK  ABSCESS;  Surgeon: Abigail Miyamoto, MD;   Location: [facility] OR;  Service: General;  Laterality: Left;   Family History:  Family History  Family history unknown: Yes   Family Psychiatric  History: None reported Social History:  Social History   Substance and Sexual Activity  Alcohol Use Not Currently     Social History   Substance and Sexual Activity  Drug Use Not Currently    Social History   Socioeconomic History  . Marital status: Single    Spouse name: Not on file  . Number of children: Not on file  . Years of education: Not on file  . Highest education level: Not on file  Occupational History  . Not on file  Tobacco Use  . Smoking status: Never Smoker  . Smokeless tobacco: Never Used  Vaping Use  . Vaping Use: Unknown  Substance and Sexual Activity  . Alcohol use: Not Currently  . Drug use: Not Currently  . Sexual activity: Not Currently    Birth control/protection: Post-menopausal  Other Topics Concern  . Not on file  Social History Narrative  . Not on file   Social Determinants of Health   Financial Resource Strain:   . Difficulty of Paying Living Expenses: Not on file  Food Insecurity:   . Worried About Programme researcher, broadcasting/film/video in the Last Year: Not on file  . Ran Out of Food in the Last Year: Not on file  Transportation Needs:   . Lack of Transportation (Medical): Not on file  . Lack of Transportation (Non-Medical): Not on file  Physical Activity:   .  Days of Exercise per Week: Not on file  . Minutes of Exercise per Session: Not on file  Stress:   . Feeling of Stress : Not on file  Social Connections:   . Frequency of Communication with Friends and Family: Not on file  . Frequency of Social Gatherings with Friends and Family: Not on file  . Attends Religious Services: Not on file  . Active Member of Clubs or Organizations: Not on file  . Attends Banker Meetings: Not on file  . Marital Status: Not on file    Sleep: Fair  Appetite:  Good  Current Medications: Current  Facility-Administered Medications  Medication Dose Route Frequency Provider Last Rate Last Admin  . acetaminophen (TYLENOL) tablet 650 mg  650 mg Oral Q6H PRN Pricilla Loveless, MD      . apixaban Everlene Balls) tablet 5 mg  5 mg Oral BID Pricilla Loveless, MD   5 mg at 05/20/20 0002  . ascorbic acid (VITAMIN C) tablet 500 mg  500 mg Oral BID Pricilla Loveless, MD   500 mg at 05/20/20 0001  . atorvastatin (LIPITOR) tablet 10 mg  10 mg Oral QHS Pricilla Loveless, MD   10 mg at 05/20/20 0001  . calcium carbonate (TUMS - dosed in mg elemental calcium) chewable tablet 200 mg of elemental calcium  1 tablet Oral Daily Pricilla Loveless, MD   200 mg of elemental calcium at 05/19/20 1023  . carvedilol (COREG) tablet 12.5 mg  12.5 mg Oral BID WC Pricilla Loveless, MD   12.5 mg at 05/19/20 1748  . diphenhydrAMINE-zinc acetate (BENADRYL) 2-0.1 % cream 1 application  1 application Topical TID PRN Pricilla Loveless, MD      . divalproex (DEPAKOTE SPRINKLE) capsule 250 mg  250 mg Oral TID Rankin, Shuvon B, NP   250 mg at 05/20/20 0002  . docusate sodium (COLACE) capsule 100 mg  100 mg Oral BID PRN Pricilla Loveless, MD      . famotidine (PEPCID) tablet 20 mg  20 mg Oral BID Pricilla Loveless, MD   20 mg at 05/20/20 0015  . feeding supplement (PRO-STAT SUGAR FREE 64) liquid 30 mL  30 mL Oral BID Pricilla Loveless, MD   30 mL at 05/06/20 1011  . furosemide (LASIX) tablet 40 mg  40 mg Oral Daily Pricilla Loveless, MD   40 mg at 05/19/20 1023  . hydrALAZINE (APRESOLINE) tablet 25 mg  25 mg Oral Q8H Pricilla Loveless, MD   25 mg at 05/20/20 0016  . insulin glargine (LANTUS) injection 10 Units  10 Units Subcutaneous QHS Pricilla Loveless, MD   10 Units at 05/17/20 2137  . Ipratropium-Albuterol (COMBIVENT) respimat 1 puff  1 puff Inhalation Q6H PRN Lorre Nick, MD      . linagliptin (TRADJENTA) tablet 5 mg  5 mg Oral Q breakfast Pricilla Loveless, MD   5 mg at 05/20/20 0843   And  . metFORMIN (GLUCOPHAGE) tablet 1,000 mg  1,000 mg Oral Q breakfast  Pricilla Loveless, MD   1,000 mg at 05/20/20 0843  . LORazepam (ATIVAN) tablet 1 mg  1 mg Oral Q12H PRN Ophelia Shoulder E, NP   1 mg at 05/16/20 0028   Or  . LORazepam (ATIVAN) injection 1 mg  1 mg Intramuscular Q12H PRN Ophelia Shoulder E, NP      . losartan (COZAAR) tablet 50 mg  50 mg Oral Daily Pricilla Loveless, MD   50 mg at 05/19/20 1023  . metFORMIN (GLUCOPHAGE) tablet 1,000 mg  1,000  mg Oral Q supper Pricilla Loveless, MD   1,000 mg at 05/17/20 1758  . multivitamin with minerals tablet 1 tablet  1 tablet Oral Daily Pricilla Loveless, MD   1 tablet at 05/19/20 1023  . nutrition supplement (JUVEN) (JUVEN) powder packet 1 packet  1 packet Oral BID BM Pricilla Loveless, MD   1 packet at 05/11/20 1415  . OLANZapine (ZYPREXA) injection 5 mg  5 mg Intramuscular QHS Nwoko, Uchenna E, PA   5 mg at 05/16/20 2125   Or  . OLANZapine zydis (ZYPREXA) disintegrating tablet 5 mg  5 mg Oral QHS Nwoko, Uchenna E, PA   5 mg at 05/20/20 0000  . pantoprazole (PROTONIX) EC tablet 40 mg  40 mg Oral BID Pricilla Loveless, MD   40 mg at 05/20/20 0001  . protein supplement (ENSURE MAX) liquid  11 oz Oral BID Pricilla Loveless, MD   11 oz at 05/15/20 1043  . senna-docusate (Senokot-S) tablet 1 tablet  1 tablet Oral Daily Pricilla Loveless, MD   1 tablet at 05/19/20 1023  . zinc sulfate capsule 220 mg  220 mg Oral Daily Pricilla Loveless, MD   220 mg at 05/19/20 1022   Current Outpatient Medications  Medication Sig Dispense Refill  . Amino Acids-Protein Hydrolys (FEEDING SUPPLEMENT, PRO-STAT SUGAR FREE 64,) LIQD Take 30 mLs by mouth 2 (two) times daily. 887 mL 0  . apixaban (ELIQUIS) 5 MG TABS tablet Take 5 mg by mouth 2 (two) times daily.    Marland Kitchen ascorbic acid (VITAMIN C) 500 MG tablet Take 500 mg by mouth daily.     Marland Kitchen atorvastatin (LIPITOR) 10 MG tablet Take 10 mg by mouth at bedtime.    . calcium carbonate (TUMS - DOSED IN MG ELEMENTAL CALCIUM) 500 MG chewable tablet Chew 1 tablet by mouth daily.    . carvedilol (COREG) 25 MG tablet  Take 12.5 mg by mouth 2 (two) times daily with a meal.    . Cholecalciferol (VITAMIN D) 125 MCG (5000 UT) CAPS Take 10,000 Units by mouth once a week. Wednesday    . divalproex (DEPAKOTE SPRINKLE) 125 MG capsule Take 125 mg by mouth daily. Every afternoon.    . divalproex (DEPAKOTE) 500 MG DR tablet Take 500 mg by mouth 2 (two) times daily.    . Emollient (EUCERIN INTENSIVE REPAIR) LOTN Apply 1 application topically daily. To both knees for rash.    . furosemide (LASIX) 40 MG tablet Take 40 mg by mouth daily.     . hydrALAZINE (APRESOLINE) 25 MG tablet Take 1 tablet (25 mg total) by mouth every 8 (eight) hours.    . hydrocortisone cream 1 % Apply topically 2 (two) times daily. (Patient taking differently: Apply 1 application topically 2 (two) times daily. ) 30 g 0  . insulin glargine (LANTUS) 100 UNIT/ML injection Inject 10 Units into the skin at bedtime. Notify PEC for CBG less than 100 or greater than 250.    Marland Kitchen Ipratropium-Albuterol (COMBIVENT RESPIMAT) 20-100 MCG/ACT AERS respimat Inhale 1 puff into the lungs every 6 (six) hours.    Marland Kitchen LORazepam (ATIVAN) 0.5 MG tablet Take 0.5 mg by mouth 2 (two) times daily as needed for anxiety.    Marland Kitchen losartan (COZAAR) 25 MG tablet Take 50 mg by mouth daily.    . Multiple Vitamin (MULTIVITAMIN WITH MINERALS) TABS tablet Take 1 tablet by mouth daily.    . NON FORMULARY Take 120 mLs by mouth daily. Medpass to promote healing    . nutrition supplement,  JUVEN, (JUVEN) PACK Take 1 packet by mouth 2 (two) times daily between meals.  0  . pantoprazole (PROTONIX) 40 MG tablet Take 1 tablet (40 mg total) by mouth 2 (two) times daily. (Patient taking differently: Take 40 mg by mouth daily. ) 30 tablet 0  . sennosides-docusate sodium (SENOKOT-S) 8.6-50 MG tablet Take 1 tablet by mouth daily.    . sitaGLIPtin-metformin (JANUMET) 50-1000 MG tablet Take 1 tablet by mouth 2 (two) times daily with a meal.    . zinc sulfate 220 (50 Zn) MG capsule Take 220 mg by mouth daily.     Marland Kitchen acetaminophen (TYLENOL) 325 MG tablet Take 650 mg by mouth every 6 (six) hours as needed for mild pain or headache.    . diphenhydrAMINE (BENADRYL) 2 % cream Apply 1 application topically 3 (three) times daily as needed for itching.    . docusate sodium (COLACE) 100 MG capsule Take 1 capsule (100 mg total) by mouth 2 (two) times daily as needed for mild constipation. 10 capsule 0  . Ensure Max Protein (ENSURE MAX PROTEIN) LIQD Take 330 mLs (11 oz total) by mouth 2 (two) times daily. (Patient not taking: Reported on 05/05/2020)      Lab Results:  Results for orders placed or performed during the hospital encounter of 05/05/20 (from the past 48 hour(s))  CBG monitoring, ED     Status: Abnormal   Collection Time: 05/19/20  8:04 AM  Result Value Ref Range   Glucose-Capillary 109 (H) 70 - 99 mg/dL    Comment: Glucose reference range applies only to samples taken after fasting for at least 8 hours.    Blood Alcohol level:  Lab Results  Component Value Date   ETH <10 05/05/2020   ETH <10 02/01/2020    Physical Findings: AIMS:  , ,  ,  ,    CIWA:    COWS:     Musculoskeletal: Strength & Muscle Tone: decreased Gait & Station: normal, unable to stand Patient leans: N/A  Psychiatric Specialty Exam: Physical Exam Vitals and nursing note reviewed.  Constitutional:      Appearance: She is well-developed.  HENT:     Head: Normocephalic.  Cardiovascular:     Rate and Rhythm: Normal rate.  Pulmonary:     Effort: Pulmonary effort is normal.  Neurological:     Mental Status: She is alert and oriented to person, place, and time.  Psychiatric:        Attention and Perception: Attention and perception normal.        Mood and Affect: Affect is labile.        Speech: Speech is tangential.        Behavior: Behavior is cooperative.        Thought Content: Thought content is delusional.        Cognition and Memory: Cognition is impaired. Memory is impaired.        Judgment: Judgment is  inappropriate.     Review of Systems  Constitutional: Negative.   HENT: Negative.   Eyes: Negative.   Respiratory: Negative.   Cardiovascular: Negative.   Gastrointestinal: Negative.   Genitourinary: Negative.   Musculoskeletal: Negative.   Skin: Negative.   Psychiatric/Behavioral: Positive for confusion.    Blood pressure (!) 96/57, pulse (!) 54, temperature 98.2 F (36.8 C), temperature source Oral, resp. rate 15, height  (1.676 m), weight 92.5 kg, SpO2 95 %.Body mass index is 32.91 kg/m.  General Appearance: Casual and Fairly Groomed  Eye  Contact:  Good  Speech:  Clear and Coherent and Normal Rate  Volume:  Normal  Mood:  Euthymic  Affect:  Labile  Thought Process:  Disorganized and Descriptions of Associations: Tangential  Orientation:  Other:  Person, time  Thought Content:  Tangential  Suicidal Thoughts:  No  Homicidal Thoughts:  No  Memory:  Immediate;   Poor Recent;   Poor Remote;   Poor  Judgement:  Impaired  Insight:  Lacking  Psychomotor Activity:  Normal  Concentration:  Concentration: Fair and Attention Span: Poor  Recall:  Poor  Fund of Knowledge:  Fair  Language:  Fair  Akathisia:  No  Handed:  Right  AIMS (if indicated):     Assets:  Communication Skills Desire for Improvement Financial Resources/Insurance Housing Intimacy Leisure Time Social Support  ADL's:  Impaired  Cognition:  Impaired,  Moderate  Sleep:         Treatment Plan Summary: Patient reviewed with Dr. Nelly RoutArchana Kumar.  Patient continues to meet criteria for inpatient psychiatric geriatric treatment at this time.  Patient's behavior continues to improve.  Medication management: Increase Zyprexa from 5mg  QHS to 10mg  QHS.  Patrcia Dollyina L Tate, FNP 05/20/2020, 9:29 AM

## 2020-05-20 NOTE — BH Assessment (Signed)
[facility] Assessment Progress Note  Per Berneice Heinrich, NP, this involuntary pt continues to require psychiatric hospitalization.  Please see this writer's past notes for ongoing attempts to place pt in a geriatric psychiatry unit.  Today this writer has called [facility], where Koleen Nimrod reports that they only anticipate one geriatric discharge and they have three patients waiting in their ED.  A 10:37 I called [facility].  Call rolled to voicemail and I left a message with return call pending as of this writing.  It should be noted, however, that yesterday, 05/19/2020 they reported that they anticipate no discharges until Thursday, 05/26/2020.  As of this writing, final disposition is pending.  EDP Lorre Nick, MD and pt's nurse, [name] Hymen, have been notified.  Doylene Canning, [place] [facility] Coordinator [phone]

## 2020-05-21 LAB — CBG MONITORING, ED
Glucose-Capillary: 97 mg/dL (ref 70–99)
Glucose-Capillary: 121 mg/dL — ABNORMAL HIGH (ref 70–99)
Glucose-Capillary: 130 mg/dL — ABNORMAL HIGH (ref 70–99)

## 2020-05-21 NOTE — ED Notes (Signed)
Pt rambling, confused. No s/s of distress.

## 2020-05-21 NOTE — Consult Note (Addendum)
[facility] Psych ED Progress Note  05/21/2020 4:03 PM Becky Hendricks  MRN:  [id number]   INTERVAL HPI VISIT 05/21/2020: Subjective: The patient states, "I don't know who you are.  In addition to that, you are under suspicion.  You look like my brother."  On evaluation today, the patient is laying in bed with the covers removed and adult brief exposed.  She does not respond when called by name and does not return the salutation. No apparent distress notes, as she appears relaxed in bed.  She continues to present confused, speaks in a [place] accent, is disoriented and offers tangential responses to questions.  In reviewing the [facility], she refused her depakote today;she is eating meals today but displays intermittent agitation.     INTERVAL VISIT 05/20/2020: Subjective: Patient continues to be tangential in conversation.  Patient states "look at the names, look at the people, they do not know everything when they are born, you are related to me and you have a good brother."  Patient assessed by nurse practitioner.  Patient alert to self and time only currently.  Patient disoriented to situation.  Patient pleasant cooperative during assessment.  Patient tangential in conversation at times states "I will do so from the 60s and there is a tremendous change taking place this is the beginning of [place] and anyone can go out with anyone."  Patient behavior continues to appear to be improving, spoke with attending RN.  Patient appears to be compliant with medications.  Patient offered support and encouragement.  Attempted to discuss medication changes.  No adverse effects reported to any medications at this time.    Principal Problem: Bipolar disorder (HCC) Diagnosis:  Principal Problem:   Bipolar disorder (HCC)  Total Time spent with patient: 30 minutes  Past Psychiatric History: Bipolar 1 disorder, psychosis  Past Medical History:  Past Medical History:  Diagnosis Date  . Atrial fibrillation (HCC)    . Bipolar disorder (HCC)   . Bipolar disorder (HCC) 04/27/2020  . Clostridium difficile diarrhea 08/02/2019  . Dehydration   . Essential hypertension   . Fall 02/11/2020  . Morbid obesity (HCC)   . Type 2 diabetes mellitus (HCC)   . Weakness     Past Surgical History:  Procedure Laterality Date  . INCISION AND DRAINAGE PERIRECTAL ABSCESS Left 12/04/2019   Procedure: IRRIGATION AND DEBRIDEMENT LEFT BUTTOCK ABSCESS;  Surgeon: Violeta Gelinas, MD;  Location: [facility] OR;  Service: General;  Laterality: Left;  . INCISION AND DRAINAGE PERIRECTAL ABSCESS Left 12/10/2019   Procedure: REPEAT IRRIGATION AND DEBRIDEMENT BUTTOCK  ABSCESS;  Surgeon: Abigail Miyamoto, MD;  Location: [facility] OR;  Service: General;  Laterality: Left;   Family History:  Family History  Family history unknown: Yes   Family Psychiatric  History: None reported Social History:  Social History   Substance and Sexual Activity  Alcohol Use Not Currently     Social History   Substance and Sexual Activity  Drug Use Not Currently    Social History   Socioeconomic History  . Marital status: Single    Spouse name: Not on file  . Number of children: Not on file  . Years of education: Not on file  . Highest education level: Not on file  Occupational History  . Not on file  Tobacco Use  . Smoking status: Never Smoker  . Smokeless tobacco: Never Used  Vaping Use  . Vaping Use: Unknown  Substance and Sexual Activity  . Alcohol use: Not Currently  . Drug use:  Not Currently  . Sexual activity: Not Currently    Birth control/protection: Post-menopausal  Other Topics Concern  . Not on file  Social History Narrative  . Not on file   Social Determinants of Health   Financial Resource Strain:   . Difficulty of Paying Living Expenses: Not on file  Food Insecurity:   . Worried About Programme researcher, broadcasting/film/video in the Last Year: Not on file  . Ran Out of Food in the Last Year: Not on file  Transportation Needs:   . Lack of  Transportation (Medical): Not on file  . Lack of Transportation (Non-Medical): Not on file  Physical Activity:   . Days of Exercise per Week: Not on file  . Minutes of Exercise per Session: Not on file  Stress:   . Feeling of Stress : Not on file  Social Connections:   . Frequency of Communication with Friends and Family: Not on file  . Frequency of Social Gatherings with Friends and Family: Not on file  . Attends Religious Services: Not on file  . Active Member of Clubs or Organizations: Not on file  . Attends Banker Meetings: Not on file  . Marital Status: Not on file    Sleep: Fair  Appetite:  Good  Current Medications: Current Facility-Administered Medications  Medication Dose Route Frequency Provider Last Rate Last Admin  . acetaminophen (TYLENOL) tablet 650 mg  650 mg Oral Q6H PRN Pricilla Loveless, MD      . apixaban Everlene Balls) tablet 5 mg  5 mg Oral BID Pricilla Loveless, MD   5 mg at 05/20/20 2130  . ascorbic acid (VITAMIN C) tablet 500 mg  500 mg Oral BID Pricilla Loveless, MD   500 mg at 05/20/20 2129  . atorvastatin (LIPITOR) tablet 10 mg  10 mg Oral QHS Pricilla Loveless, MD   10 mg at 05/20/20 2124  . calcium carbonate (TUMS - dosed in mg elemental calcium) chewable tablet 200 mg of elemental calcium  1 tablet Oral Daily Pricilla Loveless, MD   200 mg of elemental calcium at 05/20/20 1025  . carvedilol (COREG) tablet 12.5 mg  12.5 mg Oral BID WC Pricilla Loveless, MD   12.5 mg at 05/19/20 1748  . diphenhydrAMINE-zinc acetate (BENADRYL) 2-0.1 % cream 1 application  1 application Topical TID PRN Pricilla Loveless, MD      . divalproex (DEPAKOTE SPRINKLE) capsule 250 mg  250 mg Oral TID Rankin, Shuvon B, NP   250 mg at 05/20/20 2124  . docusate sodium (COLACE) capsule 100 mg  100 mg Oral BID PRN Pricilla Loveless, MD      . famotidine (PEPCID) tablet 20 mg  20 mg Oral BID Pricilla Loveless, MD   20 mg at 05/20/20 2123  . feeding supplement (PRO-STAT SUGAR FREE 64) liquid 30 mL   30 mL Oral BID Pricilla Loveless, MD   30 mL at 05/20/20 2125  . furosemide (LASIX) tablet 40 mg  40 mg Oral Daily Pricilla Loveless, MD   40 mg at 05/20/20 1025  . hydrALAZINE (APRESOLINE) tablet 25 mg  25 mg Oral Q8H Pricilla Loveless, MD   25 mg at 05/20/20 0016  . insulin glargine (LANTUS) injection 10 Units  10 Units Subcutaneous QHS Pricilla Loveless, MD   10 Units at 05/20/20 2126  . Ipratropium-Albuterol (COMBIVENT) respimat 1 puff  1 puff Inhalation Q6H PRN Lorre Nick, MD      . linagliptin (TRADJENTA) tablet 5 mg  5 mg Oral Q breakfast Criss Alvine,  Lorin Picket, MD   5 mg at 05/20/20 0843   And  . metFORMIN (GLUCOPHAGE) tablet 1,000 mg  1,000 mg Oral Q breakfast Pricilla Loveless, MD   1,000 mg at 05/20/20 0843  . LORazepam (ATIVAN) tablet 1 mg  1 mg Oral Q12H PRN Ophelia Shoulder E, NP   1 mg at 05/16/20 0028   Or  . LORazepam (ATIVAN) injection 1 mg  1 mg Intramuscular Q12H PRN Ophelia Shoulder E, NP      . losartan (COZAAR) tablet 50 mg  50 mg Oral Daily Pricilla Loveless, MD   50 mg at 05/20/20 1025  . metFORMIN (GLUCOPHAGE) tablet 1,000 mg  1,000 mg Oral Q supper Pricilla Loveless, MD   1,000 mg at 05/20/20 1844  . multivitamin with minerals tablet 1 tablet  1 tablet Oral Daily Pricilla Loveless, MD   1 tablet at 05/20/20 1025  . nutrition supplement (JUVEN) (JUVEN) powder packet 1 packet  1 packet Oral BID BM Pricilla Loveless, MD   1 packet at 05/20/20 2131  . OLANZapine (ZYPREXA) injection 10 mg  10 mg Intramuscular QHS Patrcia Dolly, FNP       Or  . OLANZapine zydis (ZYPREXA) disintegrating tablet 10 mg  10 mg Oral QHS Patrcia Dolly, FNP   10 mg at 05/20/20 2124  . pantoprazole (PROTONIX) EC tablet 40 mg  40 mg Oral BID Pricilla Loveless, MD   40 mg at 05/20/20 2123  . protein supplement (ENSURE MAX) liquid  11 oz Oral BID Pricilla Loveless, MD   11 oz at 05/15/20 1043  . senna-docusate (Senokot-S) tablet 1 tablet  1 tablet Oral Daily Pricilla Loveless, MD   1 tablet at 05/20/20 1025  . zinc sulfate capsule 220 mg   220 mg Oral Daily Pricilla Loveless, MD   220 mg at 05/20/20 1025   Current Outpatient Medications  Medication Sig Dispense Refill  . Amino Acids-Protein Hydrolys (FEEDING SUPPLEMENT, PRO-STAT SUGAR FREE 64,) LIQD Take 30 mLs by mouth 2 (two) times daily. 887 mL 0  . apixaban (ELIQUIS) 5 MG TABS tablet Take 5 mg by mouth 2 (two) times daily.    Marland Kitchen ascorbic acid (VITAMIN C) 500 MG tablet Take 500 mg by mouth daily.     Marland Kitchen atorvastatin (LIPITOR) 10 MG tablet Take 10 mg by mouth at bedtime.    . calcium carbonate (TUMS - DOSED IN MG ELEMENTAL CALCIUM) 500 MG chewable tablet Chew 1 tablet by mouth daily.    . carvedilol (COREG) 25 MG tablet Take 12.5 mg by mouth 2 (two) times daily with a meal.    . Cholecalciferol (VITAMIN D) 125 MCG (5000 UT) CAPS Take 10,000 Units by mouth once a week. Wednesday    . divalproex (DEPAKOTE SPRINKLE) 125 MG capsule Take 125 mg by mouth daily. Every afternoon.    . divalproex (DEPAKOTE) 500 MG DR tablet Take 500 mg by mouth 2 (two) times daily.    . Emollient (EUCERIN INTENSIVE REPAIR) LOTN Apply 1 application topically daily. To both knees for rash.    . furosemide (LASIX) 40 MG tablet Take 40 mg by mouth daily.     . hydrALAZINE (APRESOLINE) 25 MG tablet Take 1 tablet (25 mg total) by mouth every 8 (eight) hours.    . hydrocortisone cream 1 % Apply topically 2 (two) times daily. (Patient taking differently: Apply 1 application topically 2 (two) times daily. ) 30 g 0  . insulin glargine (LANTUS) 100 UNIT/ML injection Inject 10 Units into the  skin at bedtime. Notify PEC for CBG less than 100 or greater than 250.    Marland Kitchen Ipratropium-Albuterol (COMBIVENT RESPIMAT) 20-100 MCG/ACT AERS respimat Inhale 1 puff into the lungs every 6 (six) hours.    Marland Kitchen LORazepam (ATIVAN) 0.5 MG tablet Take 0.5 mg by mouth 2 (two) times daily as needed for anxiety.    Marland Kitchen losartan (COZAAR) 25 MG tablet Take 50 mg by mouth daily.    . Multiple Vitamin (MULTIVITAMIN WITH MINERALS) TABS tablet Take 1  tablet by mouth daily.    . NON FORMULARY Take 120 mLs by mouth daily. Medpass to promote healing    . nutrition supplement, JUVEN, (JUVEN) PACK Take 1 packet by mouth 2 (two) times daily between meals.  0  . pantoprazole (PROTONIX) 40 MG tablet Take 1 tablet (40 mg total) by mouth 2 (two) times daily. (Patient taking differently: Take 40 mg by mouth daily. ) 30 tablet 0  . sennosides-docusate sodium (SENOKOT-S) 8.6-50 MG tablet Take 1 tablet by mouth daily.    . sitaGLIPtin-metformin (JANUMET) 50-1000 MG tablet Take 1 tablet by mouth 2 (two) times daily with a meal.    . zinc sulfate 220 (50 Zn) MG capsule Take 220 mg by mouth daily.    Marland Kitchen acetaminophen (TYLENOL) 325 MG tablet Take 650 mg by mouth every 6 (six) hours as needed for mild pain or headache.    . diphenhydrAMINE (BENADRYL) 2 % cream Apply 1 application topically 3 (three) times daily as needed for itching.    . docusate sodium (COLACE) 100 MG capsule Take 1 capsule (100 mg total) by mouth 2 (two) times daily as needed for mild constipation. 10 capsule 0  . Ensure Max Protein (ENSURE MAX PROTEIN) LIQD Take 330 mLs (11 oz total) by mouth 2 (two) times daily. (Patient not taking: Reported on 05/05/2020)      Lab Results:  Results for orders placed or performed during the hospital encounter of 05/05/20 (from the past 48 hour(s))  CBG monitoring, ED     Status: Abnormal   Collection Time: 05/20/20  9:11 PM  Result Value Ref Range   Glucose-Capillary 128 (H) 70 - 99 mg/dL    Comment: Glucose reference range applies only to samples taken after fasting for at least 8 hours.  CBG monitoring, ED     Status: None   Collection Time: 05/21/20  7:41 AM  Result Value Ref Range   Glucose-Capillary 97 70 - 99 mg/dL    Comment: Glucose reference range applies only to samples taken after fasting for at least 8 hours.  CBG monitoring, ED     Status: Abnormal   Collection Time: 05/21/20 12:00 PM  Result Value Ref Range   Glucose-Capillary 121 (H)  70 - 99 mg/dL    Comment: Glucose reference range applies only to samples taken after fasting for at least 8 hours.    Blood Alcohol level:  Lab Results  Component Value Date   ETH <10 05/05/2020   ETH <10 02/01/2020    Physical Findings: AIMS:  , ,  ,  ,    CIWA:    COWS:     Musculoskeletal: Strength & Muscle Tone: decreased Gait & Station: normal, unable to stand Patient leans: N/A  Psychiatric Specialty Exam: Physical Exam Vitals and nursing note reviewed.  Constitutional:      Appearance: She is well-developed.  HENT:     Head: Normocephalic.  Cardiovascular:     Rate and Rhythm: Normal rate.  Pulmonary:  Effort: Pulmonary effort is normal.  Neurological:     Mental Status: She is alert. She is disoriented.  Psychiatric:        Attention and Perception: Attention and perception normal.        Mood and Affect: Affect is labile.        Speech: Speech is tangential.        Behavior: Behavior is cooperative.        Thought Content: Thought content is delusional.        Cognition and Memory: Cognition is impaired. Memory is impaired.        Judgment: Judgment is inappropriate.     Review of Systems  Constitutional: Negative.   HENT: Negative.   Eyes: Negative.   Respiratory: Negative.   Cardiovascular: Negative.   Gastrointestinal: Negative.   Genitourinary: Negative.   Musculoskeletal: Negative.   Skin: Negative.   Psychiatric/Behavioral: Positive for confusion.    Blood pressure (P) 92/60, pulse (P) 78, temperature (P) 98.2 F (36.8 C), temperature source (P) Oral, resp. rate 16, height 5\' 6"  (1.676 m), weight 92.5 kg, SpO2 (P) 95 %.Body mass index is 32.91 kg/m.  General Appearance: Casual and Fairly Groomed  Eye Contact:  Good  Speech:  Clear and Coherent and Normal Rate  Volume:  Normal  Mood:  Euthymic  Affect:  Congruent  Thought Process:  Disorganized and Descriptions of Associations: Tangential  Orientation:  Other:  patient not oriented  to person, place or time.  She is tangenital and does not respond with relevant answers to questions  Thought Content:  Tangential  Suicidal Thoughts:  No, unable to assess but pt does not engage in harmful behaviors  Homicidal Thoughts:  No  Memory:  Immediate;   Poor Recent;   Poor Remote;   Poor  Judgement:  Impaired  Insight:  Lacking  Psychomotor Activity:  Normal  Concentration:  Concentration: Fair and Attention Span: Poor  Recall:  Poor  Fund of Knowledge:  Fair  Language:  Fair  Akathisia:  No  Handed:  Right  AIMS (if indicated):     Assets:  Communication Skills Desire for Improvement Financial Resources/Insurance Housing Intimacy Leisure Time Social Support  ADL's:  Impaired  Cognition:  Impaired,  Moderate  Sleep:   fair      Treatment Plan Summary: Patient reviewed with Dr. Jannifer FranklinAkintayo Patient continues to meet criteria for inpatient psychiatric geriatric treatment at this time.  Patient's behavior continues to improve.  Nursing to continue to encourage patient to eat and take medications.    Medication management: Continue Zyprexa 10mg  po or IM QHS for psychosis  Chales AbrahamsShnese E Mills, NP 05/21/2020, 4:03 PM  Patient seen via tele health for psychiatric evaluation, chart reviewed and case discussed with the physician extender and developed treatment plan. Reviewed the information documented and agree with the treatment plan. Thedore MinsMojeed Loris Seelye, MD

## 2020-05-21 NOTE — ED Notes (Signed)
Wound dressing changed.

## 2020-05-21 NOTE — ED Notes (Signed)
Pt alert this shift. Pt confused, rambling at times. Pt irritable. Pt cooperative with care. Pt eating this shift. Pt refused medication this shift.

## 2020-05-22 LAB — CBG MONITORING, ED
Glucose-Capillary: 122 mg/dL — ABNORMAL HIGH (ref 70–99)
Glucose-Capillary: 168 mg/dL — ABNORMAL HIGH (ref 70–99)
Glucose-Capillary: 113 mg/dL — ABNORMAL HIGH (ref 70–99)

## 2020-05-22 NOTE — ED Notes (Signed)
Offered the pt water and to check pts brief to make sure it is clean. Pt refusing fluids and refusing to be touched by staff.

## 2020-05-22 NOTE — Consult Note (Addendum)
[name]  05/22/2020 1:43 PM Becky SersJanina Hendricks  MRN:  [id number]   Interval Progress Note 05/22/2020: Subjective: "Becky Hendricks spoke with me and I am okay now. I like her she looks like me, she talks like me."  On evaluation today, the patient continues to display confusion, which may be her baseline in the setting of her history of cognitive impairment.  Today, when asked where she was, she states, "I am in the hospital." This is an improvement from previous responses when she could not state where she was.  Per review of nursing notes, she has been calm and cooperative today, took her medications and ate breakfast. She has made several comments referencing how much she likes the current nursing staff and is seen in active conversation with staff.  Her cooperativeness appears to fluctuate according to the staff but there are no clear precipitating factors to explain this.     INTERVAL HPI VISIT 05/21/2020: Subjective: The patient states, "I don't know who you are.  In addition to that, you are under suspicion.  You look like my brother."  On evaluation today, the patient is laying in bed with the covers removed and adult brief exposed.  She does not respond when called by name and does not return the salutation. No apparent distress notes, as she appears relaxed in bed.  She continues to present confused, speaks in a [place] accent, is disoriented and offers tangential responses to questions.  [facility] she refused her depakote today;she is eating meals today but displays intermittent agitation.     INTERVAL VISIT 05/20/2020: Subjective: Patient continues to be tangential in conversation.  Patient states "look at the names, look at the people, they do not know everything when they are born, you are related to me and you have a good brother."  Patient assessed by nurse practitioner.  Patient alert to self and time only currently.  Patient disoriented to situation.  Patient  pleasant cooperative during assessment.  Patient tangential in conversation at times states "I will do so from the 60s and there is a tremendous change taking place this is the beginning of Becky Hendricks and anyone can go out with anyone."  Patient behavior continues to appear to be improving, spoke with attending RN.  Patient appears to be compliant with medications.  Patient offered support and encouragement.  Attempted to discuss medication changes.  No adverse effects reported to any medications at this time.    Principal Problem: Bipolar disorder (HCC) Diagnosis:  Principal Problem:   Bipolar disorder (HCC)  Total Time spent with patient: 30 minutes  Past Psychiatric History: Bipolar 1 disorder, psychosis  Past Medical History:  Past Medical History:  Diagnosis Date  . Atrial fibrillation (HCC)   . Bipolar disorder (HCC)   . Bipolar disorder (HCC) 04/27/2020  . Clostridium difficile diarrhea 08/02/2019  . Dehydration   . Essential hypertension   . Fall 02/11/2020  . Morbid obesity (HCC)   . Type 2 diabetes mellitus (HCC)   . Weakness     Past Surgical History:  Procedure Laterality Date  . INCISION AND DRAINAGE PERIRECTAL ABSCESS Left 12/04/2019   Procedure: IRRIGATION AND DEBRIDEMENT LEFT BUTTOCK ABSCESS;  Surgeon: Violeta Gelinashompson, Burke, MD;  [facility];  Service: General;  Laterality: Left;  . INCISION AND DRAINAGE PERIRECTAL ABSCESS Left 12/10/2019   Procedure: REPEAT IRRIGATION AND DEBRIDEMENT BUTTOCK  ABSCESS;  Surgeon: Abigail MiyamotoBlackman, Douglas, MD;  [facility];  Service: General;  Laterality: Left;  Family History:  Family History  Family history unknown: Yes   Family Psychiatric  History: None reported Social History:  Social History   Substance and Sexual Activity  Alcohol Use Not Currently     Social History   Substance and Sexual Activity  Drug Use Not Currently    Social History   Socioeconomic History  . Marital status: Single    Spouse name: Not on  file  . Number of children: Not on file  . Years of education: Not on file  . Highest education level: Not on file  Occupational History  . Not on file  Tobacco Use  . Smoking status: Never Smoker  . Smokeless tobacco: Never Used  Vaping Use  . Vaping Use: Unknown  Substance and Sexual Activity  . Alcohol use: Not Currently  . Drug use: Not Currently  . Sexual activity: Not Currently    Birth control/protection: Post-menopausal  Other Topics Concern  . Not on file  Social History Narrative  . Not on file   Social Determinants of Health   Financial Resource Strain:   . Difficulty of Paying Living Expenses: Not on file  Food Insecurity:   . Worried About Programme researcher, broadcasting/film/video in the Last Year: Not on file  . Ran Out of Food in the Last Year: Not on file  Transportation Needs:   . Lack of Transportation (Medical): Not on file  . Lack of Transportation (Non-Medical): Not on file  Physical Activity:   . Days of Exercise per Week: Not on file  . Minutes of Exercise per Session: Not on file  Stress:   . Feeling of Stress : Not on file  Social Connections:   . Frequency of Communication with Friends and Family: Not on file  . Frequency of Social Gatherings with Friends and Family: Not on file  . Attends Religious Services: Not on file  . Active Member of Clubs or Organizations: Not on file  . Attends Banker Meetings: Not on file  . Marital Status: Not on file    Sleep: Fair  Appetite:  Good  Current Medications: Current Facility-Administered Medications  Medication Dose Route Frequency Provider Last Rate Last Admin  . acetaminophen (TYLENOL) tablet 650 mg  650 mg Oral Q6H PRN Pricilla Loveless, MD      . apixaban (ELIQUIS) tablet 5 mg  5 mg Oral BID Pricilla Loveless, MD   5 mg at 05/22/20 1030  . ascorbic acid (VITAMIN C) tablet 500 mg  500 mg Oral BID Pricilla Loveless, MD   500 mg at 05/22/20 1030  . atorvastatin (LIPITOR) tablet 10 mg  10 mg Oral QHS Pricilla Loveless, MD   10 mg at 05/21/20 2200  . calcium carbonate (TUMS - dosed in mg elemental calcium) chewable tablet 200 mg of elemental calcium  1 tablet Oral Daily Pricilla Loveless, MD   200 mg of elemental calcium at 05/22/20 1028  . carvedilol (COREG) tablet 12.5 mg  12.5 mg Oral BID WC Pricilla Loveless, MD   12.5 mg at 05/22/20 1028  . diphenhydrAMINE-zinc acetate (BENADRYL) 2-0.1 % cream 1 application  1 application Topical TID PRN Pricilla Loveless, MD      . divalproex (DEPAKOTE SPRINKLE) capsule 250 mg  250 mg Oral TID Rankin, Shuvon B, NP   250 mg at 05/22/20 1030  . docusate sodium (COLACE) capsule 100 mg  100 mg Oral BID PRN Pricilla Loveless, MD      . famotidine (PEPCID) tablet 20  mg  20 mg Oral BID Pricilla Loveless, MD   20 mg at 05/22/20 1028  . feeding supplement (PRO-STAT SUGAR FREE 64) liquid 30 mL  30 mL Oral BID Pricilla Loveless, MD   30 mL at 05/20/20 2125  . furosemide (LASIX) tablet 40 mg  40 mg Oral Daily Pricilla Loveless, MD   40 mg at 05/22/20 1029  . hydrALAZINE (APRESOLINE) tablet 25 mg  25 mg Oral Q8H Pricilla Loveless, MD   25 mg at 05/21/20 2203  . insulin glargine (LANTUS) injection 10 Units  10 Units Subcutaneous QHS Pricilla Loveless, MD   10 Units at 05/21/20 2202  . Ipratropium-Albuterol (COMBIVENT) respimat 1 puff  1 puff Inhalation Q6H PRN Lorre Nick, MD      . linagliptin (TRADJENTA) tablet 5 mg  5 mg Oral Q breakfast Pricilla Loveless, MD   5 mg at 05/20/20 0843   And  . metFORMIN (GLUCOPHAGE) tablet 1,000 mg  1,000 mg Oral Q breakfast Pricilla Loveless, MD   1,000 mg at 05/22/20 1028  . LORazepam (ATIVAN) tablet 1 mg  1 mg Oral Q12H PRN Ophelia Shoulder E, NP   1 mg at 05/16/20 0028   Or  . LORazepam (ATIVAN) injection 1 mg  1 mg Intramuscular Q12H PRN Ophelia Shoulder E, NP      . losartan (COZAAR) tablet 50 mg  50 mg Oral Daily Pricilla Loveless, MD   50 mg at 05/22/20 1029  . metFORMIN (GLUCOPHAGE) tablet 1,000 mg  1,000 mg Oral Q supper Pricilla Loveless, MD   1,000 mg at 05/20/20  1844  . multivitamin with minerals tablet 1 tablet  1 tablet Oral Daily Pricilla Loveless, MD   1 tablet at 05/22/20 1028  . nutrition supplement (JUVEN) (JUVEN) powder packet 1 packet  1 packet Oral BID BM Pricilla Loveless, MD   1 packet at 05/20/20 2131  . OLANZapine (ZYPREXA) injection 10 mg  10 mg Intramuscular QHS Patrcia Dolly, FNP       Or  . OLANZapine zydis (ZYPREXA) disintegrating tablet 10 mg  10 mg Oral QHS Patrcia Dolly, FNP   10 mg at 05/21/20 2200  . pantoprazole (PROTONIX) EC tablet 40 mg  40 mg Oral BID Pricilla Loveless, MD   40 mg at 05/22/20 1029  . protein supplement (ENSURE MAX) liquid  11 oz Oral BID Pricilla Loveless, MD   11 oz at 05/15/20 1043  . senna-docusate (Senokot-S) tablet 1 tablet  1 tablet Oral Daily Pricilla Loveless, MD   1 tablet at 05/20/20 1025  . zinc sulfate capsule 220 mg  220 mg Oral Daily Pricilla Loveless, MD   220 mg at 05/22/20 1029   Current Outpatient Medications  Medication Sig Dispense Refill  . Amino Acids-Protein Hydrolys (FEEDING SUPPLEMENT, PRO-STAT SUGAR FREE 64,) LIQD Take 30 mLs by mouth 2 (two) times daily. 887 mL 0  . apixaban (ELIQUIS) 5 MG TABS tablet Take 5 mg by mouth 2 (two) times daily.    Marland Kitchen ascorbic acid (VITAMIN C) 500 MG tablet Take 500 mg by mouth daily.     Marland Kitchen atorvastatin (LIPITOR) 10 MG tablet Take 10 mg by mouth at bedtime.    . calcium carbonate (TUMS - DOSED IN MG ELEMENTAL CALCIUM) 500 MG chewable tablet Chew 1 tablet by mouth daily.    . carvedilol (COREG) 25 MG tablet Take 12.5 mg by mouth 2 (two) times daily with a meal.    . Cholecalciferol (VITAMIN D) 125 MCG (5000 UT) CAPS Take  10,000 Units by mouth once a week. Wednesday    . divalproex (DEPAKOTE SPRINKLE) 125 MG capsule Take 125 mg by mouth daily. Every afternoon.    . divalproex (DEPAKOTE) 500 MG DR tablet Take 500 mg by mouth 2 (two) times daily.    . Emollient (EUCERIN INTENSIVE REPAIR) LOTN Apply 1 application topically daily. To both knees for rash.    . furosemide  (LASIX) 40 MG tablet Take 40 mg by mouth daily.     . hydrALAZINE (APRESOLINE) 25 MG tablet Take 1 tablet (25 mg total) by mouth every 8 (eight) hours.    . hydrocortisone cream 1 % Apply topically 2 (two) times daily. (Patient taking differently: Apply 1 application topically 2 (two) times daily. ) 30 g 0  . insulin glargine (LANTUS) 100 UNIT/ML injection Inject 10 Units into the skin at bedtime. Notify PEC for CBG less than 100 or greater than 250.    Marland Kitchen Ipratropium-Albuterol (COMBIVENT RESPIMAT) 20-100 MCG/ACT AERS respimat Inhale 1 puff into the lungs every 6 (six) hours.    Marland Kitchen LORazepam (ATIVAN) 0.5 MG tablet Take 0.5 mg by mouth 2 (two) times daily as needed for anxiety.    Marland Kitchen losartan (COZAAR) 25 MG tablet Take 50 mg by mouth daily.    . Multiple Vitamin (MULTIVITAMIN WITH MINERALS) TABS tablet Take 1 tablet by mouth daily.    . NON FORMULARY Take 120 mLs by mouth daily. Medpass to promote healing    . nutrition supplement, JUVEN, (JUVEN) PACK Take 1 packet by mouth 2 (two) times daily between meals.  0  . pantoprazole (PROTONIX) 40 MG tablet Take 1 tablet (40 mg total) by mouth 2 (two) times daily. (Patient taking differently: Take 40 mg by mouth daily. ) 30 tablet 0  . sennosides-docusate sodium (SENOKOT-S) 8.6-50 MG tablet Take 1 tablet by mouth daily.    . sitaGLIPtin-metformin (JANUMET) 50-1000 MG tablet Take 1 tablet by mouth 2 (two) times daily with a meal.    . zinc sulfate 220 (50 Zn) MG capsule Take 220 mg by mouth daily.    Marland Kitchen acetaminophen (TYLENOL) 325 MG tablet Take 650 mg by mouth every 6 (six) hours as needed for mild pain or headache.    . diphenhydrAMINE (BENADRYL) 2 % cream Apply 1 application topically 3 (three) times daily as needed for itching.    . docusate sodium (COLACE) 100 MG capsule Take 1 capsule (100 mg total) by mouth 2 (two) times daily as needed for mild constipation. 10 capsule 0  . Ensure Max Protein (ENSURE MAX PROTEIN) LIQD Take 330 mLs (11 oz total) by mouth  2 (two) times daily. (Patient not taking: Reported on 05/05/2020)      Lab Results:  Results for orders placed or performed during the hospital encounter of 05/05/20 (from the past 48 hour(s))  CBG monitoring, ED     Status: Abnormal   Collection Time: 05/20/20  9:11 PM  Result Value Ref Range   Glucose-Capillary 128 (H) 70 - 99 mg/dL    Comment: Glucose reference range applies only to samples taken after fasting for at least 8 hours.  CBG monitoring, ED     Status: None   Collection Time: 05/21/20  7:41 AM  Result Value Ref Range   Glucose-Capillary 97 70 - 99 mg/dL    Comment: Glucose reference range applies only to samples taken after fasting for at least 8 hours.  CBG monitoring, ED     Status: Abnormal   Collection Time: 05/21/20  12:00 PM  Result Value Ref Range   Glucose-Capillary 121 (H) 70 - 99 mg/dL    Comment: Glucose reference range applies only to samples taken after fasting for at least 8 hours.  CBG monitoring, ED     Status: Abnormal   Collection Time: 05/21/20  4:36 PM  Result Value Ref Range   Glucose-Capillary 130 (H) 70 - 99 mg/dL    Comment: Glucose reference range applies only to samples taken after fasting for at least 8 hours.  CBG monitoring, ED     Status: Abnormal   Collection Time: 05/22/20  7:18 AM  Result Value Ref Range   Glucose-Capillary 122 (H) 70 - 99 mg/dL    Comment: Glucose reference range applies only to samples taken after fasting for at least 8 hours.  CBG monitoring, ED     Status: Abnormal   Collection Time: 05/22/20 11:38 AM  Result Value Ref Range   Glucose-Capillary 168 (H) 70 - 99 mg/dL    Comment: Glucose reference range applies only to samples taken after fasting for at least 8 hours.    Blood Alcohol level:  Lab Results  Component Value Date   ETH <10 05/05/2020   ETH <10 02/01/2020    Physical Findings: AIMS:  , ,  ,  ,    CIWA:    COWS:     Musculoskeletal: Strength & Muscle Tone: decreased Gait & Station: normal,  unable to stand Patient leans: N/A  Psychiatric Specialty Exam: Physical Exam Vitals and nursing note reviewed.  Constitutional:      Appearance: She is well-developed.  HENT:     Head: Normocephalic.  Cardiovascular:     Rate and Rhythm: Normal rate.  Pulmonary:     Effort: Pulmonary effort is normal.  Neurological:     Mental Status: She is alert. She is disoriented.  Psychiatric:        Attention and Perception: Attention and perception normal.        Mood and Affect: Affect is labile.        Speech: Speech is tangential.        Behavior: Behavior is cooperative.        Thought Content: Thought content is delusional.        Cognition and Memory: Cognition is impaired. Memory is impaired.        Judgment: Judgment is inappropriate.     Review of Systems  Constitutional: Negative.   HENT: Negative.   Eyes: Negative.   Respiratory: Negative.   Cardiovascular: Negative.   Gastrointestinal: Negative.   Endocrine: Negative.   Genitourinary: Negative.   Musculoskeletal: Negative.   Skin: Negative.   Allergic/Immunologic: Negative.   Neurological: Negative.   Hematological: Negative.   Psychiatric/Behavioral: Positive for agitation (intermittent), confusion and decreased concentration.    Blood pressure 125/69, pulse 85, temperature 99 F (37.2 C), temperature source Oral, resp. rate 18, height 5\' 6"  (1.676 m), weight 92.5 kg, SpO2 92 %.Body mass index is 32.91 kg/m.  General Appearance: Casual and Fairly Groomed  Eye Contact:  Good  Speech:  Clear and Coherent and Normal Rate  Volume:  Normal  Mood:  Euthymic  Affect:  Congruent  Thought Process:  Disorganized and Descriptions of Associations: Tangential  Orientation:  Other:  patient not oriented to person, place or time.  She is tangenital and does not respond with relevant answers to questions  Thought Content:  Tangential  Suicidal Thoughts:  No  Homicidal Thoughts:  No  Memory:  Immediate;   Poor Recent;    Poor Remote;   Poor  Judgement:  Impaired  Insight:  Lacking  Psychomotor Activity:  Normal  Concentration:  Concentration: Fair and Attention Span: Poor  Recall:  Poor  Fund of Knowledge:  Fair  Language:  Fair  Akathisia:  No  Handed:  Right  AIMS (if indicated):     Assets:  Communication Skills Desire for Improvement Financial Resources/Insurance Housing Intimacy Leisure Time Social Support  ADL's:  Impaired  Cognition:  Impaired,  Moderate  Sleep:   fair    Treatment Plan Summary: Patient continues to meet criteria for inpatient psychiatric geriatric treatment at this time.  Patient's behavior continues to improve.  Nursing will continue to encourage patient to eat and take medications.    Medication management: Zyprexa 10mg  po or IM QHS for psychosis  , NP  Patient seen via tele health for psychiatric evaluation, chart reviewed and case discussed with the physician extender and developed treatment plan. Reviewed the information documented and agree with the treatment plan. Chales Abrahams, MD

## 2020-05-22 NOTE — ED Notes (Signed)
Patient has been agitated on and off today as she wants to go Home.  She is still confused but is oriented to person , place, and time.  She is taking her medications now without difficulty.

## 2020-05-23 ENCOUNTER — Encounter (HOSPITAL_COMMUNITY): Payer: Self-pay | Admitting: Registered Nurse

## 2020-05-23 LAB — CBG MONITORING, ED: Glucose-Capillary: 123 mg/dL — ABNORMAL HIGH (ref 70–99)

## 2020-05-23 MED ORDER — OLANZAPINE 10 MG PO TABS: 10.0000 mg | ORAL_TABLET | Freq: Every day | ORAL | 0 refills | Status: DC

## 2020-05-23 NOTE — BH Assessment (Signed)
[facility] Assessment Progress Note  This involuntary pt has not been seen by psychiatry thus far today.  Per Ophelia Shoulder, NP on 05/22/2020, pt continues to need placement in a geriatric psychiatry facility.  For this writer's past efforts to place pt, please see my notes from last week. Today at 10:45 I called [facility].  Call rolled to voice mail and I left a message.  Please note, however, that last week they did not anticipate having any beds available prior to Thursday, 05/26/2020, and given past history leaving messages for them, they are unlikely to return my call.  At 10:46 I called [facility].  They report that they have no beds available on any of their units today.  These details will be reported to [facility] Rankin, NP after she rounds on pt's today.  EDP Gwyneth Sprout, MD and pt's nurse, Beth, have been notified.  Doylene Canning, [place] [facility] Coordinator [phone]

## 2020-05-23 NOTE — Progress Notes (Signed)
TOC CM/CSW has spoken with [place] at [facility].  Facility is willing to take pt back, but wants to confirm with Dr or NP that pt is at baseline and is suitable for return back to long-term care and not in need of psychiatry inpatient facility.  If Dr or NP could reach out to [employer] [phone] in the morning (05/24/2020) that would be appreciative.  CSW will continue to follow for dc needs.  Digna Countess Tarpley-Carter, MSW, LCSW-A Pronouns:  She, Her, Hers                  [facility] Transitions of CareClinical Social Worker [email or web address] [phone]

## 2020-05-23 NOTE — ED Notes (Signed)
Pt alert this shift. Pt confused. Pt irritable, redirected, cooperative with personal care. Pt refused medication this AM. Pt medication compliant this afternoon. Pt ate meals this shift.

## 2020-05-23 NOTE — Consult Note (Signed)
[facility] ED Progress Note  05/23/2020 2:23 PM Becky Hendricks  MRN:  [id number]    Interval Progress Note 05/23/2020: Subjective:  Patient states "I was just married a few weeks ago and my husband and I haven't had a chance to consummate the marriage, could have sex in a bed like this."  Becky Hendricks, 74 y.o., female patient seen via tele psych by this provider, consulted with Dr. Lucianne Muss; and chart reviewed on 05/23/20.  On evaluation Becky Hendricks reports she is feeling okay.  Patient then states "you look different from the nurse practitioner from yesterday.  Explained to patient that I was a different NP.  Patient is aware that she is in the hospital; and wanting to go home.  Patient states she was living at [facility] prior to hospitalization.  Patient orientation in and out.  She is able to talk about the present as far as her immediate memory and then she reverts back to things that happen when younger or years ago.  Patient was also upset stating that she was given an injection against her will last night.  RN at bedside states that patient was given an IM injection last night related to agitation.  During evaluation Becky Hendricks is alert/oriented x self, place.  She states that it is November instead of September but states "I'm not sure, any time I ask nobody wants to tell me anything."  Patient states she is ready to go back home.  Patient pretty much calm/cooperative; but had moment of raising her voice during assessment when she had something to say and felt the nurse practitioner was not listening or letting her get her point across.  Patients mood is congruent with affect.  She does not appear to be responding to internal/external stimuli or delusional thoughts.  Patient denies suicidal/self-harm/homicidal ideation, psychosis, and paranoia.  Spoke to patients nurse who reports that the patient is doing better.  States that patient doesn't appear to be hallucinations but does talk about  the past a lot and a lot of rambling.  States that patient refused he medication last night but took this morning and yesterday morning.  States that the patient trust certain nurses.  States that the patient has been eating her meals pretty consistently.  Patient does have some confusion but may be related to memory and at her baseline       Interval Progress Note 05/22/2020: Subjective: "Becky Hendricks spoke with me and I am okay now. I like her she looks like me, she talks like me."  On evaluation today, the patient continues to display confusion, which may be her baseline in the setting of her history of cognitive impairment.  Today, when asked where she was, she states, "I am in the hospital." This is an improvement from previous responses when she could not state where she was.  Per review of nursing notes, she has been calm and cooperative today, took her medications and ate breakfast. She has made several comments referencing how much she likes the current nursing staff and is seen in active conversation with staff.  Her cooperativeness appears to fluctuate according to the staff but there are no clear precipitating factors to explain this.     INTERVAL HPI VISIT 05/21/2020: Subjective: The patient states, "I don't know who you are.  In addition to that, you are under suspicion.  You look like my brother."  On evaluation today, the patient is laying in bed with the covers removed and adult brief exposed.  She does not respond when called by name and does not return the salutation. No apparent distress notes, as she appears relaxed in bed.  She continues to present confused, speaks in a [place] accent, is disoriented and offers tangential responses to questions.  In reviewing the [facility], she refused her Depakote today;she is eating meals today but displays intermittent agitation.     INTERVAL VISIT 05/20/2020: Subjective: Patient continues to be tangential in conversation.  Patient states "look at the names,  look at the people, they do not know everything when they are born, you are related to me and you have a good brother."  Patient assessed by nurse practitioner.  Patient alert to self and time only currently.  Patient disoriented to situation.  Patient pleasant cooperative during assessment.  Patient tangential in conversation at times states "I will do so from the 60s and there is a tremendous change taking place this is the beginning of Becky Hendricks and anyone can go out with anyone."  Patient behavior continues to appear to be improving, spoke with attending RN.  Patient appears to be compliant with medications.  Patient offered support and encouragement.  Attempted to discuss medication changes.  No adverse effects reported to any medications at this time.    Principal Problem: Bipolar disorder (HCC) Diagnosis:  Principal Problem:   Bipolar disorder (HCC)  Total Time spent with patient: 30 minutes  Past Psychiatric History: Bipolar 1 disorder, psychosis  Past Medical History:  Past Medical History:  Diagnosis Date  . Atrial fibrillation (HCC)   . Bipolar disorder (HCC)   . Bipolar disorder (HCC) 04/27/2020  . Clostridium difficile diarrhea 08/02/2019  . Dehydration   . Essential hypertension   . Fall 02/11/2020  . Morbid obesity (HCC)   . Type 2 diabetes mellitus (HCC)   . Weakness     Past Surgical History:  Procedure Laterality Date  . INCISION AND DRAINAGE PERIRECTAL ABSCESS Left 12/04/2019   Procedure: IRRIGATION AND DEBRIDEMENT LEFT BUTTOCK ABSCESS;  Surgeon: Violeta Gelinas, MD;  Location[facility] OR;  Service: General;  Laterality: Left;  . INCISION AND DRAINAGE PERIRECTAL ABSCESS Left 12/10/2019   Procedure: REPEAT IRRIGATION AND DEBRIDEMENT BUTTOCK  ABSCESS;  Surgeon: Abigail Miyamoto, MD;  Location: [facility] OR;  Service: General;  Laterality: Left;   Family History:  Family History  Family history unknown: Yes   Family Psychiatric  History: None reported Social History:   Social History   Substance and Sexual Activity  Alcohol Use Not Currently     Social History   Substance and Sexual Activity  Drug Use Not Currently    Social History   Socioeconomic History  . Marital status: Single    Spouse name: Not on file  . Number of children: Not on file  . Years of education: Not on file  . Highest education level: Not on file  Occupational History  . Not on file  Tobacco Use  . Smoking status: Never Smoker  . Smokeless tobacco: Never Used  Vaping Use  . Vaping Use: Unknown  Substance and Sexual Activity  . Alcohol use: Not Currently  . Drug use: Not Currently  . Sexual activity: Not Currently    Birth control/protection: Post-menopausal  Other Topics Concern  . Not on file  Social History Narrative  . Not on file   Social Determinants of Health   Financial Resource Strain:   . Difficulty of Paying Living Expenses: Not on file  Food Insecurity:   . Worried About Radiation protection practitioner  of Food in the Last Year: Not on file  . Ran Out of Food in the Last Year: Not on file  Transportation Needs:   . Lack of Transportation (Medical): Not on file  . Lack of Transportation (Non-Medical): Not on file  Physical Activity:   . Days of Exercise per Week: Not on file  . Minutes of Exercise per Session: Not on file  Stress:   . Feeling of Stress : Not on file  Social Connections:   . Frequency of Communication with Friends and Family: Not on file  . Frequency of Social Gatherings with Friends and Family: Not on file  . Attends Religious Services: Not on file  . Active Member of Clubs or Organizations: Not on file  . Attends BankerClub or Organization Meetings: Not on file  . Marital Status: Not on file    Sleep: Fair  Appetite:  Good  Current Medications: Current Facility-Administered Medications  Medication Dose Route Frequency Provider Last Rate Last Admin  . acetaminophen (TYLENOL) tablet 650 mg  650 mg Oral Q6H PRN Pricilla LovelessGoldston, Scott, MD      .  apixaban (ELIQUIS) tablet 5 mg  5 mg Oral BID Pricilla LovelessGoldston, Scott, MD   5 mg at 05/22/20 1030  . ascorbic acid (VITAMIN C) tablet 500 mg  500 mg Oral BID Pricilla LovelessGoldston, Scott, MD   500 mg at 05/22/20 1030  . atorvastatin (LIPITOR) tablet 10 mg  10 mg Oral QHS Pricilla LovelessGoldston, Scott, MD   10 mg at 05/21/20 2200  . calcium carbonate (TUMS - dosed in mg elemental calcium) chewable tablet 200 mg of elemental calcium  1 tablet Oral Daily Pricilla LovelessGoldston, Scott, MD   200 mg of elemental calcium at 05/22/20 1028  . carvedilol (COREG) tablet 12.5 mg  12.5 mg Oral BID WC Pricilla LovelessGoldston, Scott, MD   12.5 mg at 05/22/20 1910  . diphenhydrAMINE-zinc acetate (BENADRYL) 2-0.1 % cream 1 application  1 application Topical TID PRN Pricilla LovelessGoldston, Scott, MD      . divalproex (DEPAKOTE SPRINKLE) capsule 250 mg  250 mg Oral TID Jye Fariss B, NP   250 mg at 05/22/20 1655  . docusate sodium (COLACE) capsule 100 mg  100 mg Oral BID PRN Pricilla LovelessGoldston, Scott, MD      . famotidine (PEPCID) tablet 20 mg  20 mg Oral BID Pricilla LovelessGoldston, Scott, MD   20 mg at 05/22/20 1028  . feeding supplement (PRO-STAT SUGAR FREE 64) liquid 30 mL  30 mL Oral BID Pricilla LovelessGoldston, Scott, MD   30 mL at 05/20/20 2125  . furosemide (LASIX) tablet 40 mg  40 mg Oral Daily Pricilla LovelessGoldston, Scott, MD   40 mg at 05/22/20 1029  . hydrALAZINE (APRESOLINE) tablet 25 mg  25 mg Oral Q8H Pricilla LovelessGoldston, Scott, MD   25 mg at 05/21/20 2203  . insulin glargine (LANTUS) injection 10 Units  10 Units Subcutaneous QHS Pricilla LovelessGoldston, Scott, MD   10 Units at 05/21/20 2202  . Ipratropium-Albuterol (COMBIVENT) respimat 1 puff  1 puff Inhalation Q6H PRN Lorre NickAllen, Anthony, MD      . linagliptin (TRADJENTA) tablet 5 mg  5 mg Oral Q breakfast Pricilla LovelessGoldston, Scott, MD   5 mg at 05/22/20 1656   And  . metFORMIN (GLUCOPHAGE) tablet 1,000 mg  1,000 mg Oral Q breakfast Pricilla LovelessGoldston, Scott, MD   1,000 mg at 05/22/20 1028  . LORazepam (ATIVAN) tablet 1 mg  1 mg Oral Q12H PRN Ophelia ShoulderMills, Shnese E, NP   1 mg at 05/16/20 0028   Or  . LORazepam (  ATIVAN) injection 1  mg  1 mg Intramuscular Q12H PRN Ophelia Shoulder E, NP   1 mg at 05/22/20 2213  . losartan (COZAAR) tablet 50 mg  50 mg Oral Daily Pricilla Loveless, MD   50 mg at 05/22/20 1029  . metFORMIN (GLUCOPHAGE) tablet 1,000 mg  1,000 mg Oral Q supper Pricilla Loveless, MD   1,000 mg at 05/22/20 1904  . multivitamin with minerals tablet 1 tablet  1 tablet Oral Daily Pricilla Loveless, MD   1 tablet at 05/22/20 1028  . nutrition supplement (JUVEN) (JUVEN) powder packet 1 packet  1 packet Oral BID BM Pricilla Loveless, MD   1 packet at 05/20/20 2131  . OLANZapine (ZYPREXA) injection 10 mg  10 mg Intramuscular QHS Patrcia Dolly, FNP       Or  . OLANZapine zydis (ZYPREXA) disintegrating tablet 10 mg  10 mg Oral QHS Patrcia Dolly, FNP   10 mg at 05/21/20 2200  . pantoprazole (PROTONIX) EC tablet 40 mg  40 mg Oral BID Pricilla Loveless, MD   40 mg at 05/22/20 1029  . protein supplement (ENSURE MAX) liquid  11 oz Oral BID Pricilla Loveless, MD   11 oz at 05/15/20 1043  . senna-docusate (Senokot-S) tablet 1 tablet  1 tablet Oral Daily Pricilla Loveless, MD   1 tablet at 05/20/20 1025  . zinc sulfate capsule 220 mg  220 mg Oral Daily Pricilla Loveless, MD   220 mg at 05/22/20 1029   Current Outpatient Medications  Medication Sig Dispense Refill  . Amino Acids-Protein Hydrolys (FEEDING SUPPLEMENT, PRO-STAT SUGAR FREE 64,) LIQD Take 30 mLs by mouth 2 (two) times daily. 887 mL 0  . apixaban (ELIQUIS) 5 MG TABS tablet Take 5 mg by mouth 2 (two) times daily.    Marland Kitchen ascorbic acid (VITAMIN C) 500 MG tablet Take 500 mg by mouth daily.     Marland Kitchen atorvastatin (LIPITOR) 10 MG tablet Take 10 mg by mouth at bedtime.    . calcium carbonate (TUMS - DOSED IN MG ELEMENTAL CALCIUM) 500 MG chewable tablet Chew 1 tablet by mouth daily.    . carvedilol (COREG) 25 MG tablet Take 12.5 mg by mouth 2 (two) times daily with a meal.    . Cholecalciferol (VITAMIN D) 125 MCG (5000 UT) CAPS Take 10,000 Units by mouth once a week. Wednesday    . divalproex (DEPAKOTE  SPRINKLE) 125 MG capsule Take 125 mg by mouth daily. Every afternoon.    . divalproex (DEPAKOTE) 500 MG DR tablet Take 500 mg by mouth 2 (two) times daily.    . Emollient (EUCERIN INTENSIVE REPAIR) LOTN Apply 1 application topically daily. To both knees for rash.    . furosemide (LASIX) 40 MG tablet Take 40 mg by mouth daily.     . hydrALAZINE (APRESOLINE) 25 MG tablet Take 1 tablet (25 mg total) by mouth every 8 (eight) hours.    . hydrocortisone cream 1 % Apply topically 2 (two) times daily. (Patient taking differently: Apply 1 application topically 2 (two) times daily. ) 30 g 0  . insulin glargine (LANTUS) 100 UNIT/ML injection Inject 10 Units into the skin at bedtime. Notify PEC for CBG less than 100 or greater than 250.    Marland Kitchen Ipratropium-Albuterol (COMBIVENT RESPIMAT) 20-100 MCG/ACT AERS respimat Inhale 1 puff into the lungs every 6 (six) hours.    Marland Kitchen LORazepam (ATIVAN) 0.5 MG tablet Take 0.5 mg by mouth 2 (two) times daily as needed for anxiety.    Marland Kitchen  losartan (COZAAR) 25 MG tablet Take 50 mg by mouth daily.    . Multiple Vitamin (MULTIVITAMIN WITH MINERALS) TABS tablet Take 1 tablet by mouth daily.    . NON FORMULARY Take 120 mLs by mouth daily. Medpass to promote healing    . nutrition supplement, JUVEN, (JUVEN) PACK Take 1 packet by mouth 2 (two) times daily between meals.  0  . pantoprazole (PROTONIX) 40 MG tablet Take 1 tablet (40 mg total) by mouth 2 (two) times daily. (Patient taking differently: Take 40 mg by mouth daily. ) 30 tablet 0  . sennosides-docusate sodium (SENOKOT-S) 8.6-50 MG tablet Take 1 tablet by mouth daily.    . sitaGLIPtin-metformin (JANUMET) 50-1000 MG tablet Take 1 tablet by mouth 2 (two) times daily with a meal.    . zinc sulfate 220 (50 Zn) MG capsule Take 220 mg by mouth daily.    Marland Kitchen acetaminophen (TYLENOL) 325 MG tablet Take 650 mg by mouth every 6 (six) hours as needed for mild pain or headache.    . diphenhydrAMINE (BENADRYL) 2 % cream Apply 1 application  topically 3 (three) times daily as needed for itching.    . docusate sodium (COLACE) 100 MG capsule Take 1 capsule (100 mg total) by mouth 2 (two) times daily as needed for mild constipation. 10 capsule 0  . Ensure Max Protein (ENSURE MAX PROTEIN) LIQD Take 330 mLs (11 oz total) by mouth 2 (two) times daily. (Patient not taking: Reported on 05/05/2020)      Lab Results:  Results for orders placed or performed during the hospital encounter of 05/05/20 (from the past 48 hour(s))  CBG monitoring, ED     Status: Abnormal   Collection Time: 05/21/20  4:36 PM  Result Value Ref Range   Glucose-Capillary 130 (H) 70 - 99 mg/dL    Comment: Glucose reference range applies only to samples taken after fasting for at least 8 hours.  CBG monitoring, ED     Status: Abnormal   Collection Time: 05/22/20  7:18 AM  Result Value Ref Range   Glucose-Capillary 122 (H) 70 - 99 mg/dL    Comment: Glucose reference range applies only to samples taken after fasting for at least 8 hours.  CBG monitoring, ED     Status: Abnormal   Collection Time: 05/22/20 11:38 AM  Result Value Ref Range   Glucose-Capillary 168 (H) 70 - 99 mg/dL    Comment: Glucose reference range applies only to samples taken after fasting for at least 8 hours.  CBG monitoring, ED     Status: Abnormal   Collection Time: 05/22/20  4:28 PM  Result Value Ref Range   Glucose-Capillary 113 (H) 70 - 99 mg/dL    Comment: Glucose reference range applies only to samples taken after fasting for at least 8 hours.  CBG monitoring, ED     Status: Abnormal   Collection Time: 05/23/20  8:12 AM  Result Value Ref Range   Glucose-Capillary 123 (H) 70 - 99 mg/dL    Comment: Glucose reference range applies only to samples taken after fasting for at least 8 hours.   Comment 1 Notify RN    Comment 2 Document in Chart     Blood Alcohol level:  Lab Results  Component Value Date   ETH <10 05/05/2020   ETH <10 02/01/2020   Musculoskeletal: Strength & Muscle  Tone: decreased Gait & Station: normal, unable to stand Patient leans: N/A Psychiatric Specialty Exam: Physical Exam Vitals and nursing note reviewed.  Constitutional:      Appearance: She is well-developed.  HENT:     Head: Normocephalic.  Cardiovascular:     Rate and Rhythm: Normal rate.  Pulmonary:     Effort: Pulmonary effort is normal.  Neurological:     Mental Status: She is alert oriented to place.  She does have some confusion. She .  Psychiatric:        Attention and Perception: Attention and perception normal.        Mood and Affect: Affect is labile.        Speech: Speech is tangential.        Behavior: Behavior is cooperative.        Thought Content: Thought content is delusional.        Cognition and Memory: Cognition is impaired. Memory is impaired.        Judgment: Judgment is inappropriate.     Review of Systems  Constitutional: Negative.   HENT: Negative.   Eyes: Negative.   Respiratory: Negative.   Cardiovascular: Negative.   Gastrointestinal: Negative.   Endocrine: Negative.   Genitourinary: Negative.   Musculoskeletal: Negative.   Skin: Negative.   Allergic/Immunologic: Negative.   Neurological: Negative.   Hematological: Negative.   Psychiatric/Behavioral: Positive for agitation (intermittent), confusion and decreased concentration.    Blood pressure 121/63, pulse 72, temperature 98.2 F (36.8 C), temperature source Oral, resp. rate 16, height 5\' 6"  (1.676 m), weight 92.5 kg, SpO2 94 %.Body mass index is 32.91 kg/m.  General Appearance: Casual and Fairly Groomed  Eye Contact:  Good  Speech:  Clear and Coherent and Normal Rate  Volume:  Normal  Mood:  Euthymic  Affect:  Congruent  Thought Process:  Disorganized and Descriptions of Associations: Tangential  Orientation:  Other:  patient not oriented to person, place or time.  She is tangenital and does not respond with relevant answers to questions  Thought Content:  Tangential  Suicidal  Thoughts:  No  Homicidal Thoughts:  No  Memory:  Immediate;   Poor Recent;   Poor Remote;   Poor  Judgement:  Impaired  Insight:  Lacking  Psychomotor Activity:  Normal  Concentration:  Concentration: Fair and Attention Span: Poor  Recall:  Poor  Fund of Knowledge:  Fair  Language:  Fair  Akathisia:  No  Handed:  Right  AIMS (if indicated):     Assets:  Communication Skills Desire for Improvement Financial Resources/Insurance Housing Intimacy Leisure Time Social Support  ADL's:  Impaired  Cognition:  Impaired,  Moderate  Sleep:   fair     Physical Findings: AIMS:  , ,  ,  ,    CIWA:    COWS:      Treatment Plan Summary: Patient appears to be at baseline and is wanting to go home.  Patient does have problem with memory and talks a lot about the past.  Feel that patient is ready to be discharged back to facility.  Social work consult ordered to assist with transition back to facility.      Medication management: Zyprexa 10mg  po or IM QHS for psychosis  Continue current medications.     , NP

## 2020-05-23 NOTE — ED Notes (Signed)
Refusing All meds at this time will try again @ later time.

## 2020-05-23 NOTE — BH Assessment (Signed)
[facility] Assessment Progress Note  Per Shuvon Rankin, NP, this pt no longer require psychiatric hospitalization.  EDP Gwyneth Sprout, MD has been notified, and she has rescinded pt's IVC.  Behavioral health referrals are not indicated for pt at this time.  A social work consult has been ordered to facilitate pt's return to the community.  Pt's nurse, Waynetta Sandy, has been notified.  Doylene Canning, MA Triage Specialist [phone]

## 2020-05-23 NOTE — ED Provider Notes (Addendum)
Emergency Medicine Observation Re-evaluation Note  Becky Hendricks is a 74 y.o. female, seen on rounds today.  Pt initially presented to the ED for complaints of Psychiatric Evaluation Currently, the patient is no specific complaints.  Physical Exam  BP 117/64   Pulse 73   Temp 98.2 F (36.8 C) (Oral)   Resp 16   Ht 5\' 6"  (1.676 m)   Wt 92.5 kg   LMP  (LMP Unknown)   SpO2 96%   BMI 32.91 kg/m  Physical Exam General: NAD Cardiac: RRR Lungs: no resp distress Psych: confusion  ED Course / MDM  EKG:EKG Interpretation  Date/Time:  Thursday May 05 2020 13:29:19 EDT Ventricular Rate:  69 PR Interval:    QRS Duration: 94 QT Interval:  377 QTC Calculation: 404 R Axis:   50 Text Interpretation: Sinus rhythm vs afib Atrial premature complexes in couplets Abnormal R-wave progression, early transition Nonspecific T abnrm, anterolateral leads Confirmed by 10-09-1996 [phone]) on 05/05/2020 1:31:22 PM    I have reviewed the labs performed to date as well as medications administered while in observation.  Recent changes in the last 24 hours include none.  Plan  Current plan is for pt has been cleared by psychiatry.  She is wanting to go home.  She will need to go back to facility with zyprexa. Patient is not under full IVC at this time.   05/07/2020, MD 05/23/20 07/23/20    [id number], MD 05/23/20 1550

## 2020-05-24 LAB — CBG MONITORING, ED: Glucose-Capillary: 128 mg/dL — ABNORMAL HIGH (ref 70–99)

## 2020-05-24 NOTE — Consult Note (Addendum)
[facility] Psych ED Progress Note  05/24/2020 2:14 PM Capucine Tryon  MRN:  [id number]   Interval Progress Note 05/24/2020: Subjective:  My vital signs are ok, my oxygen is okay, and sugar in my blood was good."  Assessment:  Becky Hendricks, 74 y.o., female patient seen via tele psych for psychiatric reassessment by this provider, consulted with Dr. Lucianne Muss; and chart reviewed on 05/24/20.  On evaluation Becky Hendricks Patient asked if she slept well last night, she responded The night people injected me, a guy and 2 more.  They hold me and since dont like to take any medicine I dont need, and cause I thought I was going to get to go home.  No, I didnt sleep cause the door is opening and closing, the lights are off and on and 3 people going in and out all night, how can you sleep.  Patient asked to explain what she meant about medications she did not need My vital signs were good, my oxygen was good, and the sugar in my blood was good, didnt need the medicine.  I feel like Im 27 not 75.  Patient asked if she was having any thoughts of wanting to harm or kill herself.  No Then patient began to tell the story of her falling off the bed twice, and that she was in physical therapy and if the therapy worked as it should have, she would be able to walk properly.    Patient states she is not hearing voices or seeing things that others cant.  Patient also denies paranoia.  Patient was also aware that the nurse practitioners that she is seeing today is the same on she say yesterday.  Patient is also aware that the nurse from last night was a female and 2 females working with him.   Patient states that she ate her breakfast.  Patient is resting in bed looking at TV.   During evaluation Janell Keeling is alert/oriented x 3; calm/cooperative; and mood is congruent with affect.  She does not appear to be responding to internal/external stimuli or delusional thoughts.  Patient denies suicidal/self-harm/homicidal  ideation, psychosis, and paranoia.  Patient has made improvement since her initial admission to the ED.  Currently patient does not appear to be responding to internal/external stimuli.  Patient is aware that she is in the hospital, she is oriented to person, place, and time.  She does have periods of confusion but more likely due to cognitive impairment and patients baseline.  Patient conversations are more with patient reverting to her past, at times the patient will speak in [place] and at times she talks as if she is currently living in the past such as talking about just getting married and not getting the chance to consummate her marriage.  Patient has also been taking her medications for the most part.  After speaking to nursing noted that there are different nurses on unit daily and there is not the consistence to develop familiarity and rapport with patient.  Nurses that the patient is not familiar with she may refuse to take her medication, and then patient may ask for medications at a certain time, but she has been taking medications.  Psychiatric feels that patient is at her baseline and would do better with medications once she was back at the nursing facility opposed to being in the ED and only able to stay in one room, there is no socialization with peers, no activities of interest for the patient while in  the ED.    Collateral Update:  This provider spoke with the staff at [facility] (Megan: Production designer, theatre/television/film, Director of Nursing) and was informed after review patient chart and nursing note that patient has not changed and continues to refuse most medications.  They feel that the patient is not stable enough to return to nursing facility because it would not be therapeutic for patient.  States that they feel the patient continues to need psychiatric inpatient treatment.  Attempted to voice to [facility] staff that patient does better with nurses she is familiar and has a rapport with but when there is a  nurse, she is not familiar with she may refuse her medications; but she does eventually ask for her medications.   Interval Progress Note 05/23/2020: Subjective:  Patient states "I was just married a few weeks ago and my husband and I haven't had a chance to consummate the marriage, could have sex in a bed like this."  Becky Hendricks, 74 y.o., female patient seen via tele psych by this provider, consulted with Dr. Lucianne Muss; and chart reviewed on 05/23/20.  On evaluation Henri Guedes reports she is feeling okay.  Patient then states "you look different from the nurse practitioner from yesterday.  Explained to patient that I was a different NP.  Patient is aware that she is in the hospital; and wanting to go home.  Patient states she was living at [facility] prior to hospitalization.  Patient orientation in and out.  She is able to talk about the present as far as her immediate memory and then she reverts to things that happen when younger or years ago.  Patient was also upset stating that she was given an injection against her will last night.  RN at bedside states that patient was given an IM injection last night related to agitation.  During evaluation Jahayra Mazo is alert/oriented x self, place.  She states that it is November instead of September but states "I'm not sure, any time I ask nobody wants to tell me anything."  Patient states she is ready to go back home.  Patient pretty much calm/cooperative; but had moment of raising her voice during assessment when she had something to say and felt the nurse practitioner was not listening or letting her get her point across.  Patients mood is congruent with affect.  She does not appear to be responding to internal/external stimuli or delusional thoughts.  Patient denies suicidal/self-harm/homicidal ideation, psychosis, and paranoia.  Spoke to patients nurse who reports that the patient is doing better.  States that patient doesn't appear to be  hallucinations but does talk about the past a lot and a lot of rambling.  States that patient refused he medication last night but took this morning and yesterday morning.  States that the patient trust certain nurses.  States that the patient has been eating her meals consistently.  Patient does have some confusion but may be related to memory and at her baseline      Interval Progress Note 05/22/2020: Subjective: "Tamela Oddi spoke with me and I am okay now. I like her she looks like me, she talks like me."  On evaluation today, the patient continues to display confusion, which may be her baseline in the setting of her history of cognitive impairment.  Today, when asked where she was, she states, "I am in the hospital." This is an improvement from previous responses when she could not state where she was.  Per review of nursing notes,  she has been calm and cooperative today, took her medications and ate breakfast. She has made several comments referencing how much she likes the current nursing staff and is seen in active conversation with staff.  Her cooperativeness appears to fluctuate according to the staff but there are no clear precipitating factors to explain this.     INTERVAL HPI VISIT 05/21/2020: Subjective: The patient states, "I don't know who you are.  In addition to that, you are under suspicion.  You look like my brother."  On evaluation today, the patient is laying in bed with the covers removed and adult brief exposed.  She does not respond when called by name and does not return the salutation. No apparent distress notes, as she appears relaxed in bed.  She continues to present confused, speaks in a [place] accent, is disoriented and offers tangential responses to questions.  In reviewing the [facility], she refused her depakote today;she is eating meals today but displays intermittent agitation.     INTERVAL VISIT 05/20/2020: Subjective: Patient continues to be tangential in conversation.  Patient  states "look at the names, look at the people, they do not know everything when they are born, you are related to me and you have a good brother."  Patient assessed by nurse practitioner.  Patient alert to self and time only currently.  Patient disoriented to situation.  Patient pleasant cooperative during assessment.  Patient tangential in conversation at times states "I will do so from the 60s and there is a tremendous change taking place this is the beginning of [place] and anyone can go out with anyone."  Patient behavior continues to appear to be improving, spoke with attending RN.  Patient appears to be compliant with medications.  Patient offered support and encouragement.  Attempted to discuss medication changes.  No adverse effects reported to any medications at this time.    Principal Problem: Bipolar disorder (HCC) Diagnosis:  Principal Problem:   Bipolar disorder (HCC)  Total Time spent with patient: 30 minutes  Past Psychiatric History: Bipolar 1 disorder, psychosis  Past Medical History:  Past Medical History:  Diagnosis Date   Atrial fibrillation (HCC)    Bipolar disorder (HCC)    Bipolar disorder (HCC) 04/27/2020   Clostridium difficile diarrhea 08/02/2019   Dehydration    Essential hypertension    Fall 02/11/2020   Morbid obesity (HCC)    Type 2 diabetes mellitus (HCC)    Weakness     Past Surgical History:  Procedure Laterality Date   INCISION AND DRAINAGE PERIRECTAL ABSCESS Left 12/04/2019   Procedure: IRRIGATION AND DEBRIDEMENT LEFT BUTTOCK ABSCESS;  Surgeon: Violeta Gelinas, MD;  Location: [facility] OR;  Service: General;  Laterality: Left;   INCISION AND DRAINAGE PERIRECTAL ABSCESS Left 12/10/2019   Procedure: REPEAT IRRIGATION AND DEBRIDEMENT BUTTOCK  ABSCESS;  Surgeon: Abigail Miyamoto, MD;  Location: [facility] OR;  Service: General;  Laterality: Left;   Family History:  Family History  Family history unknown: Yes   Family Psychiatric  History: None  reported Social History:  Social History   Substance and Sexual Activity  Alcohol Use Not Currently     Social History   Substance and Sexual Activity  Drug Use Not Currently    Social History   Socioeconomic History   Marital status: Single    Spouse name: Not on file   Number of children: Not on file   Years of education: Not on file   Highest education level: Not on file  Occupational History  Not on file  Tobacco Use   Smoking status: Never Smoker   Smokeless tobacco: Never Used  Vaping Use   Vaping Use: Unknown  Substance and Sexual Activity   Alcohol use: Not Currently   Drug use: Not Currently   Sexual activity: Not Currently    Birth control/protection: Post-menopausal  Other Topics Concern   Not on file  Social History Narrative   Not on file   Social Determinants of Health   Financial Resource Strain:    Difficulty of Paying Living Expenses: Not on file  Food Insecurity:    Worried About Running Out of Food in the Last Year: Not on file   Ran Out of Food in the Last Year: Not on file  Transportation Needs:    Lack of Transportation (Medical): Not on file   Lack of Transportation (Non-Medical): Not on file  Physical Activity:    Days of Exercise per Week: Not on file   Minutes of Exercise per Session: Not on file  Stress:    Feeling of Stress : Not on file  Social Connections:    Frequency of Communication with Friends and Family: Not on file   Frequency of Social Gatherings with Friends and Family: Not on file   Attends Religious Services: Not on file   Active Member of Clubs or Organizations: Not on file   Attends Banker Meetings: Not on file   Marital Status: Not on file    Sleep: Fair  Appetite:  Good  Current Medications: Current Facility-Administered Medications  Medication Dose Route Frequency Provider Last Rate Last Admin   acetaminophen (TYLENOL) tablet 650 mg  650 mg Oral Q6H PRN  Pricilla Loveless, MD       apixaban (ELIQUIS) tablet 5 mg  5 mg Oral BID Pricilla Loveless, MD   5 mg at 05/22/20 1030   ascorbic acid (VITAMIN C) tablet 500 mg  500 mg Oral BID Pricilla Loveless, MD   500 mg at 05/22/20 1030   atorvastatin (LIPITOR) tablet 10 mg  10 mg Oral QHS Pricilla Loveless, MD   10 mg at 05/21/20 2200   calcium carbonate (TUMS - dosed in mg elemental calcium) chewable tablet 200 mg of elemental calcium  1 tablet Oral Daily Pricilla Loveless, MD   200 mg of elemental calcium at 05/22/20 1028   carvedilol (COREG) tablet 12.5 mg  12.5 mg Oral BID WC Pricilla Loveless, MD   12.5 mg at 05/22/20 1910   diphenhydrAMINE-zinc acetate (BENADRYL) 2-0.1 % cream 1 application  1 application Topical TID PRN Pricilla Loveless, MD       divalproex (DEPAKOTE SPRINKLE) capsule 250 mg  250 mg Oral TID Fredie Majano B, NP   250 mg at 05/23/20 1532   docusate sodium (COLACE) capsule 100 mg  100 mg Oral BID PRN Pricilla Loveless, MD       famotidine (PEPCID) tablet 20 mg  20 mg Oral BID Pricilla Loveless, MD   20 mg at 05/22/20 1028   feeding supplement (PRO-STAT SUGAR FREE 64) liquid 30 mL  30 mL Oral BID Pricilla Loveless, MD   30 mL at 05/20/20 2125   furosemide (LASIX) tablet 40 mg  40 mg Oral Daily Pricilla Loveless, MD   40 mg at 05/22/20 1029   hydrALAZINE (APRESOLINE) tablet 25 mg  25 mg Oral Q8H Pricilla Loveless, MD   25 mg at 05/24/20 0618   insulin glargine (LANTUS) injection 10 Units  10 Units Subcutaneous QHS Pricilla Loveless, MD  10 Units at 05/21/20 2202   Ipratropium-Albuterol (COMBIVENT) respimat 1 puff  1 puff Inhalation Q6H PRN Lorre NickAllen, Anthony, MD       linagliptin (TRADJENTA) tablet 5 mg  5 mg Oral Q breakfast Pricilla LovelessGoldston, Scott, MD   5 mg at 05/22/20 1656   And   metFORMIN (GLUCOPHAGE) tablet 1,000 mg  1,000 mg Oral Q breakfast Pricilla LovelessGoldston, Scott, MD   1,000 mg at 05/22/20 1028   LORazepam (ATIVAN) tablet 1 mg  1 mg Oral Q12H PRN Ophelia ShoulderMills, Shnese E, NP   1 mg at 05/16/20 0028   Or    LORazepam (ATIVAN) injection 1 mg  1 mg Intramuscular Q12H PRN Ophelia ShoulderMills, Shnese E, NP   1 mg at 05/22/20 2213   losartan (COZAAR) tablet 50 mg  50 mg Oral Daily Pricilla LovelessGoldston, Scott, MD   50 mg at 05/22/20 1029   metFORMIN (GLUCOPHAGE) tablet 1,000 mg  1,000 mg Oral Q supper Pricilla LovelessGoldston, Scott, MD   1,000 mg at 05/23/20 1848   multivitamin with minerals tablet 1 tablet  1 tablet Oral Daily Pricilla LovelessGoldston, Scott, MD   1 tablet at 05/24/20 [id number]   nutrition supplement (JUVEN) (JUVEN) powder packet 1 packet  1 packet Oral BID BM Pricilla LovelessGoldston, Scott, MD   1 packet at 05/20/20 2131   OLANZapine (ZYPREXA) injection 10 mg  10 mg Intramuscular QHS Patrcia Dollyate, Tina L, FNP       Or   OLANZapine zydis (ZYPREXA) disintegrating tablet 10 mg  10 mg Oral QHS Berneice Heinrichate, Tina L, FNP   10 mg at 05/21/20 2200   pantoprazole (PROTONIX) EC tablet 40 mg  40 mg Oral BID Pricilla LovelessGoldston, Scott, MD   40 mg at 05/22/20 1029   protein supplement (ENSURE MAX) liquid  11 oz Oral BID Pricilla LovelessGoldston, Scott, MD   11 oz at 05/15/20 1043   senna-docusate (Senokot-S) tablet 1 tablet  1 tablet Oral Daily Pricilla LovelessGoldston, Scott, MD   1 tablet at 05/24/20 [id number]   zinc sulfate capsule 220 mg  220 mg Oral Daily Pricilla LovelessGoldston, Scott, MD   220 mg at 05/24/20 [id number]   Current Outpatient Medications  Medication Sig Dispense Refill   Amino Acids-Protein Hydrolys (FEEDING SUPPLEMENT, PRO-STAT SUGAR FREE 64,) LIQD Take 30 mLs by mouth 2 (two) times daily. 887 mL 0   apixaban (ELIQUIS) 5 MG TABS tablet Take 5 mg by mouth 2 (two) times daily.     ascorbic acid (VITAMIN C) 500 MG tablet Take 500 mg by mouth daily.      atorvastatin (LIPITOR) 10 MG tablet Take 10 mg by mouth at bedtime.     calcium carbonate (TUMS - DOSED IN MG ELEMENTAL CALCIUM) 500 MG chewable tablet Chew 1 tablet by mouth daily.     carvedilol (COREG) 25 MG tablet Take 12.5 mg by mouth 2 (two) times daily with a meal.     Cholecalciferol (VITAMIN D) 125 MCG (5000 UT) CAPS Take 10,000 Units by mouth once a week.  Wednesday     divalproex (DEPAKOTE SPRINKLE) 125 MG capsule Take 125 mg by mouth daily. Every afternoon.     divalproex (DEPAKOTE) 500 MG DR tablet Take 500 mg by mouth 2 (two) times daily.     Emollient (EUCERIN INTENSIVE REPAIR) LOTN Apply 1 application topically daily. To both knees for rash.     furosemide (LASIX) 40 MG tablet Take 40 mg by mouth daily.      hydrALAZINE (APRESOLINE) 25 MG tablet Take 1 tablet (25 mg total) by mouth every 8 (eight)  hours.     hydrocortisone cream 1 % Apply topically 2 (two) times daily. (Patient taking differently: Apply 1 application topically 2 (two) times daily. ) 30 g 0   insulin glargine (LANTUS) 100 UNIT/ML injection Inject 10 Units into the skin at bedtime. Notify PEC for CBG less than 100 or greater than 250.     Ipratropium-Albuterol (COMBIVENT RESPIMAT) 20-100 MCG/ACT AERS respimat Inhale 1 puff into the lungs every 6 (six) hours.     LORazepam (ATIVAN) 0.5 MG tablet Take 0.5 mg by mouth 2 (two) times daily as needed for anxiety.     losartan (COZAAR) 25 MG tablet Take 50 mg by mouth daily.     Multiple Vitamin (MULTIVITAMIN WITH MINERALS) TABS tablet Take 1 tablet by mouth daily.     NON FORMULARY Take 120 mLs by mouth daily. Medpass to promote healing     nutrition supplement, JUVEN, (JUVEN) PACK Take 1 packet by mouth 2 (two) times daily between meals.  0   pantoprazole (PROTONIX) 40 MG tablet Take 1 tablet (40 mg total) by mouth 2 (two) times daily. (Patient taking differently: Take 40 mg by mouth daily. ) 30 tablet 0   sennosides-docusate sodium (SENOKOT-S) 8.6-50 MG tablet Take 1 tablet by mouth daily.     sitaGLIPtin-metformin (JANUMET) 50-1000 MG tablet Take 1 tablet by mouth 2 (two) times daily with a meal.     zinc sulfate 220 (50 Zn) MG capsule Take 220 mg by mouth daily.     acetaminophen (TYLENOL) 325 MG tablet Take 650 mg by mouth every 6 (six) hours as needed for mild pain or headache.     diphenhydrAMINE (BENADRYL) 2  % cream Apply 1 application topically 3 (three) times daily as needed for itching.     docusate sodium (COLACE) 100 MG capsule Take 1 capsule (100 mg total) by mouth 2 (two) times daily as needed for mild constipation. 10 capsule 0   Ensure Max Protein (ENSURE MAX PROTEIN) LIQD Take 330 mLs (11 oz total) by mouth 2 (two) times daily. (Patient not taking: Reported on 05/05/2020)     OLANZapine (ZYPREXA) 10 MG tablet Take 1 tablet (10 mg total) by mouth at bedtime. 30 tablet 0    Lab Results:  Results for orders placed or performed during the hospital encounter of 05/05/20 (from the past 48 hour(s))  CBG monitoring, ED     Status: Abnormal   Collection Time: 05/22/20  4:28 PM  Result Value Ref Range   Glucose-Capillary 113 (H) 70 - 99 mg/dL    Comment: Glucose reference range applies only to samples taken after fasting for at least 8 hours.  CBG monitoring, ED     Status: Abnormal   Collection Time: 05/23/20  8:12 AM  Result Value Ref Range   Glucose-Capillary 123 (H) 70 - 99 mg/dL    Comment: Glucose reference range applies only to samples taken after fasting for at least 8 hours.   Comment 1 Notify RN    Comment 2 Document in Chart     Blood Alcohol level:  Lab Results  Component Value Date   ETH <10 05/05/2020   ETH <10 02/01/2020    Physical Findings: AIMS:  , ,  ,  ,    CIWA:    COWS:     Musculoskeletal: Strength & Muscle Tone: decreased Gait & Station: normal, unable to stand Patient leans: N/A  Psychiatric Specialty Exam: Physical Exam Vitals and nursing note reviewed.  Constitutional:  Appearance: She is well-developed.  HENT:     Head: Normocephalic.  Cardiovascular:     Rate and Rhythm: Normal rate.  Pulmonary:     Effort: Pulmonary effort is normal.  Neurological:     Mental Status: She is alert. She is disoriented.  Psychiatric:        Attention and Perception: Attention and perception normal. She does not perceive auditory or visual  hallucinations.        Mood and Affect: Mood normal. Affect is labile.        Speech: Speech is tangential.        Behavior: Agitated: at times. Behavior is cooperative.        Thought Content: Thought content is not delusional. Thought content does not include homicidal or suicidal ideation.        Cognition and Memory: Cognition is impaired. Memory is impaired.        Judgment: Judgment is inappropriate.     Review of Systems  Constitutional: Negative.   HENT: Negative.   Eyes: Negative.   Respiratory: Negative.   Cardiovascular: Negative.   Gastrointestinal: Negative.   Endocrine: Negative.   Genitourinary: Negative.   Musculoskeletal: Negative.   Skin: Negative.   Allergic/Immunologic: Negative.   Neurological: Negative.   Hematological: Negative.   Psychiatric/Behavioral: Positive for agitation (intermitten), confusion and decreased concentration. Negative for hallucinations (Denies.  Patient does not appear to be responding to internal or external stimuli). Sleep disturbance: Related to area she is in in the ED stating difficult to sleep through night. Suicidal ideas: denies.    Blood pressure 132/68, pulse 85, temperature 97.8 F (36.6 C), temperature source Oral, resp. rate 16, height  (1.676 m), weight 92.5 kg, SpO2 90 %.Body mass index is 32.91 kg/m.  General Appearance: Casual and Fairly Groomed  Eye Contact:  Good  Speech:  Clear and Coherent and Normal Rate  Volume:  Normal  Mood:  Euthymic  Affect:  Congruent  Thought Process:  Coherent, Disorganized and Descriptions of Associations: Tangential  Orientation:  Other:  patient not oriented to person, place or time.  She is tangenital and does not respond with relevant answers to questions at times and at other times she will give logical answres  Thought Content:  Tangential  Suicidal Thoughts:  No  Homicidal Thoughts:  No  Memory:  Immediate;   Poor Recent;   Poor Remote;   Poor  Judgement:  Impaired   Insight:  Lacking  Psychomotor Activity:  Normal  Concentration:  Concentration: Fair and Attention Span: Poor  Recall:  Poor  Fund of Knowledge:  Fair  Language:  Fair  Akathisia:  No  Handed:  Right  AIMS (if indicated):     Assets:  Communication Skills Desire for Improvement Financial Resources/Insurance Housing Intimacy Leisure Time Social Support  ADL's:  Impaired  Cognition:  Impaired,  Moderate  Sleep:   fair    Treatment Plan Summary: Patient was psychiatrically cleared Yesterday 05/23/20 but nursing facility feels that the patient is not ready to come back and feels that patient continues to need inpatient psychiatric treatment related to their review of nursing notes.    Medication management: Zyprexa  po or IM QHS for psychosis  Continue current medications.  Psychiatry will continue to monitor.  Will consult with social work to assist with transition and facility placement.    Assunta Found, NP   Addendum:  06/25/2011 at 5:06 PM  Recommendations for this patient is that patient has been  psychiatrically cleared.  Patient at base line.  Patient does not need inpatient psychiatric treatment related to the patient is not psychotic, she is not suicidal or homicidal.  Patient is unable to ambulate without any assistance.  Patient has been in the ED for 19 days where her medications have been adjusted and monitored.  Patient has made significant improvement since her admission.  Patient does have confusion at time but granted her history of cognitive impairment possibly dementia related.  If patient was in the nursing facility patient would be able to socialize with peers. Patient will benefit being discharged back to nursing facility in a stable environment where she is not kept in one room in a bed increasing the chances of more skin break down, she would be able to socialize with her peers and would be able to develop rapport with consistent staff who would be  passing her medications.  In patient current location she is exposed to frequent checks and as patient has voiced makes it difficult to sleep through the night.   As stated above patient should continue current medications.  Psychiatry will continue to check on patient related to her facility refusing to pick her up stating that they feel the patient is not stable enough to return to [facility] and it would not be therapeutic for the patient.  So until the patient has been placed in another facility or she has returned to her current nursing facility [facility]) psychiatry will continue to monitor patients progress.     Tationna Fullard B. Deasia Chiu, NP

## 2020-05-24 NOTE — ED Notes (Signed)
As this writer walked past pt's room she said, "She is the one that gave me the shot - [employer]."  Then she said, "Jovani" is a sarcastic tone.

## 2020-05-24 NOTE — ED Notes (Signed)
Pt cleaned, changed and position changed.

## 2020-05-24 NOTE — Progress Notes (Signed)
CSW received a call from [facility] stating psychiatry has confirmed that pt is ready for D/C.    TOC Leadership states they will call [name] leadership to updated them and facilitate acceptance process initiation.  CSW will continue to follow for D/C needs.  Dorothe Pea. Veona Bittman  MSW, LCSW, LCAS, CCS Transitions of Care Clinical Social Worker Care Coordination Department Ph: [phone]

## 2020-05-24 NOTE — Progress Notes (Addendum)
TOC CM contacted [place], Admission Coordinator [place]. States the DON, Paleeta [phone] was handling the dc back to facility. Pt is a long term residence, they are will to take pt back but DON would like to speak to Psych MD or attending ED provider. TOC CM message sent to ED provider and Psych MD to follow up with DON in regards if pt is medically stable to return. Facility had concerns that pt has not been taking medications consistently and behavior is not at her baseline. Isidoro Donning RN CCM, [facility] ED TOC CM [phone]  TOC CM sent message to ED RN for a list of RN that will be administering pt's meds in next 3 days, pt will only take meds from RN's that she is familiar. Would like to post a list of RN in pt's room so she will know who will be giving each day and times. CM will type up and post in pt's room. Isidoro Donning RN CCM, [facility] ED TOC CM [phone]

## 2020-05-24 NOTE — ED Notes (Signed)
No change for patient continues to refuse most meds, possible d/c to [facility] today.

## 2020-05-24 NOTE — ED Notes (Signed)
Allowed this writer to change dressing on sacrum.

## 2020-05-24 NOTE — ED Notes (Signed)
Pt is eating her meals today.

## 2020-05-24 NOTE — ED Notes (Signed)
Pt speaks in a [place]-language accent nonstop in her room. She said, "Someone is throwing something in my face - ah it is Lafonda Mosses, the nurse."  This writer was no where near her.

## 2020-05-24 NOTE — Progress Notes (Addendum)
CSW received a call from [facility] stating a solution for pt's disposition is being sought via [facility] at this time with updates forthcoming.  [facility] ED TOC standing by for updates from Leadership at this time.  CSW will continue to follow for D/C needs.  Dorothe Pea. Gretta Samons  MSW, LCSW, LCAS, CCS Transitions of Care Clinical Social Worker Care Coordination Department Ph: [phone]

## 2020-05-24 NOTE — Progress Notes (Signed)
TOC CM/CSW reached out to Ken[name] in regards to accepting pt back to facility.  Everardo Pacific stated that she had spoken with [facility] and she spoke with their nurse.  [name] will accept pt back when she is psychiatrically stable, but not at this time.  Everardo Pacific stated that this was not her decision weather to accept pt back or not.  Facility currently is not accepting pt back and we do not know the extent of the time period.  CSW will continue to follow for dc needs.  Chane Magner Tarpley-Carter, MSW, LCSW-A Pronouns:  She, Her, Hers                  [facility] Transitions of CareClinical Social Worker Dietrich [email or web address] [phone]

## 2020-05-24 NOTE — ED Notes (Signed)
Pt is agitated, trying to get OOB. Offered pt her pills and a PRN Ativan or an Ativan injection and she refused them all, yelling.

## 2020-05-24 NOTE — ED Notes (Signed)
Pt being very disagreeable with other staff members.  She told another female staff member trying to help her that "You are sleeping around with everyone."

## 2020-05-24 NOTE — ED Notes (Signed)
Pt currently resisting care from this writer.  This Clinical research associate took her medications to her room (did not open them to avoid waste) told her the reason for the medications and she said that she does not have any heart problems or BP problems and I am not taking them.  You have sex with everyone and you are married which makes it worse.  Keeps talking about someone named Jovani as if that person is here.

## 2020-05-24 NOTE — ED Notes (Signed)
Pt refused to allow this writer to do a dressing change this afternoon.

## 2020-05-24 NOTE — ED Notes (Signed)
When this writer entered pt's room to offer her medications she asked this Clinical research associate, "Do you have a venereal disease?" And she went on raving something about this writer being with "Jovani".  She yells out, "And don't touch anything in here."  After Ativan injection she continues to yell out nonsensical things. Like, "Don't drink anything." This Clinical research associate entered her room and she said very sweetly, "I am a diabetic.  And you get away before I kick you, I kick! Get out of here."

## 2020-05-24 NOTE — ED Notes (Signed)
Pt is able to make needs know. Personality is feisty, but she is not being abusive. Allows care to be given (cleaned and changed and dressing changes). Bed bound for ADLs.  Feeds self with set up. Takes medications sometimes, from some nurses, but not from others.

## 2020-05-25 ENCOUNTER — Other Ambulatory Visit: Payer: Self-pay | Admitting: Registered Nurse

## 2020-05-25 NOTE — ED Notes (Signed)
Patient may have not received meds as she poured them in her milk to drink and then poured the milk into apple sauce.  Patient did eat the apple sauce.  She is very agitated and paranoid today.

## 2020-05-25 NOTE — ED Provider Notes (Signed)
Emergency Medicine Observation Re-evaluation Note  Becky Hendricks is a 74 y.o. female, seen on rounds today.  Pt initially presented to the ED for complaints of Psychiatric Evaluation Currently, the patient is in no acute distress.  Physical Exam  BP (!) 169/97 (BP Location: Left Arm)   Pulse 91   Temp 98.2 F (36.8 C) (Oral)   Resp 16   Ht 5\' 6"  (1.676 m)   Wt 92.5 kg   LMP  (LMP Unknown)   SpO2 94%   BMI 32.91 kg/m  Physical Exam General: NAD Cardiac: RRR Lungs: no respiratory distress Psych: labile affect  ED Course / MDM  EKG:EKG Interpretation  Date/Time:  Thursday May 05 2020 13:29:19 EDT Ventricular Rate:  69 PR Interval:    QRS Duration: 94 QT Interval:  377 QTC Calculation: 404 R Axis:   50 Text Interpretation: Sinus rhythm vs afib Atrial premature complexes in couplets Abnormal R-wave progression, early transition Nonspecific T abnrm, anterolateral leads Confirmed by [id number] [phone]) on 05/05/2020 1:31:22 PM    I have reviewed the labs performed to date as well as medications administered while in observation.  Recent changes in the last 24 hours include patient has been psychiatrically cleared for return to skilled nursing facility.  Plan  Current plan is for awaiting placement. Per nursing patient sacral wound appears to be improving clinically. She did refuse her morning medications.. Patient is not under full IVC at this time.   05/07/2020, MD 05/25/20 [id number]

## 2020-05-25 NOTE — Progress Notes (Addendum)
05/25/2020 128 pm TOC CM spoke to [facility] DON, agreed to do a facetime assessment with patient today. Isidoro Donning RN CCM, [facility] ED TOC CM [phone]  05/25/2020 1255 pm Received message from [facility] Supervisor to follow up with [facility]. TOC CM left message for [name] DON, Paleeta for return call. Will discuss with SNF doing an in person assessment to speak to patient. Waiting call back. Isidoro Donning RN CCM, [facility] ED TOC CM [phone]

## 2020-05-25 NOTE — ED Notes (Signed)
Patient refused her depakote because she did not believe what it was.

## 2020-05-25 NOTE — ED Notes (Signed)
Patient willingly took all night time meds, became agitated before eventually falling to sleep, slept the rest of night post midnight.

## 2020-05-25 NOTE — ED Notes (Signed)
Pt was discharged safely with PTAR,  All belongings including phone were sent with patient.  Report was given to facility.  AVS was sent with patient.

## 2020-05-25 NOTE — Consult Note (Addendum)
Telepsych Consultation   Reason for Consult:  Psychiatry consult Referring Physician:  EDP Pricilla Loveless, MD Location of Patient: [name] Location of Provider: Other: [facility]  Patient Identification: Becky Hendricks MRN:  [id number] Principal Diagnosis: Bipolar disorder [name]) Diagnosis:  Principal Problem:   Bipolar disorder (HCC)   Total Time spent with patient: 20 minutes  Subjective:   Becky Hendricks, 74 y.o., female patient seen via tele psych by this provider; chart reviewed and consulted with Dr. Lucianne Muss on 05/25/20.  On evaluation [name] reports feeling "irritated" about "still being here with these hateful people".   During evaluation [name] is laying in bed; she is alert/oriented to person and place. Time wise patient stated "it's either Tuesday or Wednesday. I'm not sure because I've been couped up in this room". Patient was initially irritated as she referred to the nurse in the room as "one of the devils" and clarified as "one of the ones I don't like" as the nurse attempted to turn off television for the assessment; pt then stated "I'm no fool. I know how to turn off the television". Patient then expressed frustration stating "This television set only has 3 channels and this one is [employer]. How am I supposed to know the time and date with only 3 channels?".  Provider asked patient if she's spoken to anyone recently and she replied "How? I've been asking to speak to my brother and nothing! I have a right to have a telephone call don't I? They say their nurses but they have to be [place] spies to be this hateful. I can't even make a phone call?" Patient stated her brother's name was "Becky Hendricks"; when asked his number patient was unable to recall stating "they should have it in my chart because I signed for him to have control when I first came here". Patient mentioned having a recent "wedding". Patient was cooperative; and mood congruent with affect, however pt did become  agitated at times particularly when "you keep interrupting me and I'm trying to tell you the story". Patient is speaking in a clear tone at moderate volume, and normal pace; with good eye contact. Her thought process is circumstantial and at times tangential. During today's assessment patient elaborated extensively about how she fell at home. Pt described how she was living alone and ordering "too much stuff" so called a local church to come pick up the items but she kept them inside because her apartment didn't permit any boxes outside. She further stated there were "boxes everywhere" and she eventually "tripped over the creamer". Patient wasn't sure how long she was on the floor but stated her [name] eventually came and tried to get her up but he was "older and not strong enough" so they called EMS who "pulled me under my arms which wasn't the right way either". Throughout conversation patient was circumstantial adding many unnecessary details but did eventually get to her point as she "felt it was important for you to know the whole story".  Pt denies having any suicidal or homicidal ideations, no psychosis, and denies having any audio or visual hallucinations.    There is no indication that she is currently responding to internal/external stimuli. Questionable delusional thought content.  Patient denies suicidal/self-harm/homicidal ideation, and no apparent psychosis. Patient has remained engaged throughout assessment and has answered. No active psychosis noted.   Collateral: Collateral information obtained from patient's brother [name] who was contacted by provider via phone to discuss patient's baseline and gain  any pertinent information. Pt's brother states there were "3 children who were all born between [place] and [place] Brother confirms patient's accent is real and not delusional. Brother further stated family was in WWII labor camps in 1956 then taken to [place] and 1966 moved to  [place] where patient was normal. Brother states patient has several Master's Degrees and speaks four languages. 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 Brother states "2 drugs have kept her stable all of these years; Depakote and another one". Brother states she has only had two other episodes and each time they were due to a medication change with the last episode in January of 1995 where patient was found naked on elevator in parent's apartment bldg accusing parents of being [place] spies. He further stated patient's medications were readjusted and patient was "fine since". Patient's brother states "I knew something was off about 2 months ago and I told [name] to look at her medications because I noticed a decline. Anytime she starts talking about the 1970's I know there is something funky going on with her medications and just today they informed me she has a right to refuse so I doubt she was taking them or they were changed". Per brother's report patients normal psychiatric medications include:   -Seroquel 100mg : 2.5 Tabs at bedtime (250mg )  -Depakote 500mg : 1 Tab BID (1000mg ) Provider spoke with patient's brother about conversation with patient earlier in day regarding the events that led to her fall at home and brother replied "that's in the correct neighborhood almost to a tee". Patient's brother confirmed the events described by patient were true and in the correct order and that he found her approximatesly 40hrs post-fall in her apartment where there was "hoarding with boxes everywhere and she had in fact  tripped over  a box of creamers".   Disposition:  Patient is psychiatrically cleared. Per   No evidence of imminent risk to self or others at present.   Patient does not meet criteria for psychiatric inpatient admission.  Per Case Manager note (Clinical Narrative) 05/25/2020 1:40pm: "230 pm [name] will accept pt back to the facility today. Will fax AVS and call PTAR. Requested report be called to [phone] room 804B. ED provider updated."  Spoke with Dr. who was informed of the above recommendation and disposition  HPI:  Per EDP Report: 74 year old female presents from her nursing facility with altered mental status.  The provider sent her out because of psychosis, unclear if it is her psychiatric disease versus infection.  A urinalysis was reportedly negative.  No fevers.  History is primarily taken from 05/27/2020, who is the [phone].  Thinks she has maybe had some recent medication changes but is not sure what.  Has a longstanding psychiatric history.   The patient talks to me like I have seen her often before, which to my knowledge I have not.  She also talks about how she needs to go to the maternity ward as she is pregnant and has been pregnant since 1965.  Past Psychiatric History:   Risk to Self: Suicidal Ideation:  No Suicidal Intent: No Is patient at risk for suicide?: No Suicidal Plan?: No Access to Means: No What has been your use of drugs/alcohol within the last 12 months?: none How many times?: 0 Other Self Harm Risks: dementia Triggers for Past Attempts: None known Intentional Self Injurious Behavior: None Risk to Others: Homicidal Ideation: No Thoughts of Harm to Others: No Current Homicidal Intent: No-Not Currently/Within Last 6 Months Current Homicidal Plan: No Access to Homicidal Means: No Identified Victim: none History of harm to others?: No Assessment of Violence: None Noted Violent Behavior Description: none reported Does patient have  access to weapons?: No Criminal Charges Pending?: No Does patient have a court date: No Prior Inpatient Therapy: Prior Inpatient Therapy:  (UTA) Prior Outpatient Therapy: Prior Outpatient Therapy:  (UTA)  Past Medical History:  Past Medical History:  Diagnosis Date   Atrial fibrillation (HCC)    Bipolar disorder (HCC)    Bipolar disorder (HCC) 04/27/2020   Clostridium difficile diarrhea 08/02/2019   Dehydration    Essential hypertension    Fall 02/11/2020   Morbid obesity (HCC)    Type 2 diabetes mellitus (HCC)    Weakness     Past Surgical History:  Procedure Laterality Date   INCISION AND DRAINAGE PERIRECTAL ABSCESS Left 12/04/2019   Procedure: IRRIGATION AND DEBRIDEMENT LEFT BUTTOCK ABSCESS;  Surgeon: Violeta Gelinashompson, Burke, MD;  [facility];  Service: General;  Laterality: Left;   INCISION AND DRAINAGE PERIRECTAL ABSCESS Left 12/10/2019   Procedure: REPEAT IRRIGATION AND DEBRIDEMENT BUTTOCK  ABSCESS;  Surgeon: Abigail MiyamotoBlackman, Douglas, MD;  Location: [facility] OR;  Service: General;  Laterality: Left;   Family History:  Family History  Family history unknown: Yes   Family Psychiatric  History: none noted Social History:  Social History   Substance and Sexual Activity  Alcohol Use Not Currently     Social History   Substance and Sexual Activity  Drug Use Not Currently    Social History   Socioeconomic History   Marital status: Single    Spouse name: Not on file   Number of children: Not on file   Years of education: Not on file   Highest education level: Not on file  Occupational History   Not on file  Tobacco Use   Smoking status: Never Smoker   Smokeless tobacco: Never Used  Vaping Use   Vaping Use: Unknown  Substance and Sexual Activity   Alcohol use: Not Currently   Drug use: Not Currently   Sexual activity: Not Currently    Birth control/protection: Post-menopausal  Other Topics Concern   Not on file  Social History Narrative   Not on file    Social Determinants of Health   Financial Resource Strain:    Difficulty of Paying Living Expenses: Not on file  Food Insecurity:    Worried About Running Out of Food in the Last Year: Not on file   [employer] Out of Food in the Last Year: Not on file  Transportation Needs:    Lack of Transportation (Medical): Not on file   Lack of Transportation (Non-Medical): Not on file  Physical Activity:    Days of Exercise per Week: Not on file   Minutes of Exercise per Session: Not on file  Stress:    Feeling of Stress : Not on file  Social Connections:    Frequency of Communication with Friends and Family: Not on file   Frequency of Social Gatherings with Friends and Family: Not on file   Attends Religious  Services: Not on file   Active Member of Clubs or Organizations: Not on file   Attends Club or Organization Meetings: Not on file   Marital Status: Not on file   Additional Social History:    Allergies:   Allergies  Allergen Reactions   Chlorhexidine Gluconate Itching   Acetazolamide Er Rash    Labs:  Results for orders placed or performed during the hospital encounter of 05/05/20 (from the past 48 hour(s))  CBG monitoring, ED     Status: Abnormal   Collection Time: 05/24/20  9:59 PM  Result Value Ref Range   Glucose-Capillary 128 (H) 70 - 99 mg/dL    Comment: Glucose reference range applies only to samples taken after fasting for at least 8 hours.    Medications:  Current Facility-Administered Medications  Medication Dose Route Frequency Provider Last Rate Last Admin   acetaminophen (TYLENOL) tablet 650 mg  650 mg Oral Q6H PRN Pricilla Loveless, MD       apixaban Everlene Balls) tablet 5 mg  5 mg Oral BID Pricilla Loveless, MD   5 mg at 05/25/20 1050   ascorbic acid (VITAMIN C) tablet 500 mg  500 mg Oral BID Pricilla Loveless, MD   500 mg at 05/25/20 1049   atorvastatin (LIPITOR) tablet 10 mg  10 mg Oral QHS Pricilla Loveless, MD   10 mg at 05/24/20 2148   calcium  carbonate (TUMS - dosed in mg elemental calcium) chewable tablet 200 mg of elemental calcium  1 tablet Oral Daily Pricilla Loveless, MD   200 mg of elemental calcium at 05/25/20 1049   carvedilol (COREG) tablet 12.5 mg  12.5 mg Oral BID WC Pricilla Loveless, MD   12.5 mg at 05/25/20 0810   diphenhydrAMINE-zinc acetate (BENADRYL) 2-0.1 % cream 1 application  1 application Topical TID PRN Pricilla Loveless, MD       divalproex (DEPAKOTE SPRINKLE) capsule 250 mg  250 mg Oral TID Rankin, Shuvon B, NP   250 mg at 05/25/20 1048   docusate sodium (COLACE) capsule 100 mg  100 mg Oral BID PRN Pricilla Loveless, MD       famotidine (PEPCID) tablet 20 mg  20 mg Oral BID Pricilla Loveless, MD   20 mg at 05/25/20 1049   feeding supplement (PRO-STAT SUGAR FREE 64) liquid 30 mL  30 mL Oral BID Pricilla Loveless, MD   30 mL at 05/20/20 2125   furosemide (LASIX) tablet 40 mg  40 mg Oral Daily Pricilla Loveless, MD   40 mg at 05/25/20 1050   hydrALAZINE (APRESOLINE) tablet 25 mg  25 mg Oral Q8H Pricilla Loveless, MD   25 mg at 05/24/20 2148   insulin glargine (LANTUS) injection 10 Units  10 Units Subcutaneous QHS Pricilla Loveless, MD   10 Units at 05/21/20 2202   Ipratropium-Albuterol (COMBIVENT) respimat 1 puff  1 puff Inhalation Q6H PRN Lorre Nick, MD       linagliptin (TRADJENTA) tablet 5 mg  5 mg Oral Q breakfast Pricilla Loveless, MD   5 mg at 05/25/20 4098   And   metFORMIN (GLUCOPHAGE) tablet 1,000 mg  1,000 mg Oral Q breakfast Pricilla Loveless, MD   1,000 mg at 05/25/20 0809   LORazepam (ATIVAN) tablet 1 mg  1 mg Oral Q12H PRN Ophelia Shoulder E, NP   1 mg at 05/16/20 0028   Or   LORazepam (ATIVAN) injection 1 mg  1 mg Intramuscular Q12H PRN Ophelia Shoulder E, NP   1 mg at 05/24/20 1514  losartan (COZAAR) tablet 50 mg  50 mg Oral Daily Pricilla Loveless, MD   50 mg at 05/25/20 1049   metFORMIN (GLUCOPHAGE) tablet 1,000 mg  1,000 mg Oral Q supper Pricilla Loveless, MD   1,000 mg at 05/23/20 1848   multivitamin with  minerals tablet 1 tablet  1 tablet Oral Daily Pricilla Loveless, MD   1 tablet at 05/25/20 1049   nutrition supplement (JUVEN) (JUVEN) powder packet 1 packet  1 packet Oral BID BM Pricilla Loveless, MD   1 packet at 05/20/20 2131   OLANZapine (ZYPREXA) injection 10 mg  10 mg Intramuscular QHS Patrcia Dolly, FNP       Or   OLANZapine zydis (ZYPREXA) disintegrating tablet 10 mg  10 mg Oral QHS Patrcia Dolly, FNP   10 mg at 05/24/20 2148   pantoprazole (PROTONIX) EC tablet 40 mg  40 mg Oral BID Pricilla Loveless, MD   40 mg at 05/25/20 1049   protein supplement (ENSURE MAX) liquid  11 oz Oral BID Pricilla Loveless, MD   11 oz at 05/15/20 1043   senna-docusate (Senokot-S) tablet 1 tablet  1 tablet Oral Daily Pricilla Loveless, MD   1 tablet at 05/25/20 1049   zinc sulfate capsule 220 mg  220 mg Oral Daily Pricilla Loveless, MD   220 mg at 05/25/20 1050   Current Outpatient Medications  Medication Sig Dispense Refill   Amino Acids-Protein Hydrolys (FEEDING SUPPLEMENT, PRO-STAT SUGAR FREE 64,) LIQD Take 30 mLs by mouth 2 (two) times daily. 887 mL 0   apixaban (ELIQUIS) 5 MG TABS tablet Take 5 mg by mouth 2 (two) times daily.     ascorbic acid (VITAMIN C) 500 MG tablet Take 500 mg by mouth daily.      atorvastatin (LIPITOR) 10 MG tablet Take 10 mg by mouth at bedtime.     calcium carbonate (TUMS - DOSED IN MG ELEMENTAL CALCIUM) 500 MG chewable tablet Chew 1 tablet by mouth daily.     carvedilol (COREG) 25 MG tablet Take 12.5 mg by mouth 2 (two) times daily with a meal.     Cholecalciferol (VITAMIN D) 125 MCG (5000 UT) CAPS Take 10,000 Units by mouth once a week. Wednesday     divalproex (DEPAKOTE SPRINKLE) 125 MG capsule Take 125 mg by mouth daily. Every afternoon.     divalproex (DEPAKOTE) 500 MG DR tablet Take 500 mg by mouth 2 (two) times daily.     Emollient (EUCERIN INTENSIVE REPAIR) LOTN Apply 1 application topically daily. To both knees for rash.     furosemide (LASIX) 40 MG tablet Take 40  mg by mouth daily.      hydrALAZINE (APRESOLINE) 25 MG tablet Take 1 tablet (25 mg total) by mouth every 8 (eight) hours.     hydrocortisone cream 1 % Apply topically 2 (two) times daily. (Patient taking differently: Apply 1 application topically 2 (two) times daily. ) 30 g 0   insulin glargine (LANTUS) 100 UNIT/ML injection Inject 10 Units into the skin at bedtime. Notify [facility] for CBG less than 100 or greater than 250.     Ipratropium-Albuterol (COMBIVENT RESPIMAT) 20-100 MCG/ACT AERS respimat Inhale 1 puff into the lungs every 6 (six) hours.     LORazepam (ATIVAN) 0.5 MG tablet Take 0.5 mg by mouth 2 (two) times daily as needed for anxiety.     losartan (COZAAR) 25 MG tablet Take 50 mg by mouth daily.     Multiple Vitamin (MULTIVITAMIN WITH MINERALS) TABS tablet Take  1 tablet by mouth daily.     NON FORMULARY Take 120 mLs by mouth daily. Medpass to promote healing     nutrition supplement, JUVEN, (JUVEN) PACK Take 1 packet by mouth 2 (two) times daily between meals.  0   pantoprazole (PROTONIX) 40 MG tablet Take 1 tablet (40 mg total) by mouth 2 (two) times daily. (Patient taking differently: Take 40 mg by mouth daily. ) 30 tablet 0   sennosides-docusate sodium (SENOKOT-S) 8.6-50 MG tablet Take 1 tablet by mouth daily.     sitaGLIPtin-metformin (JANUMET) 50-1000 MG tablet Take 1 tablet by mouth 2 (two) times daily with a meal.     zinc sulfate 220 (50 Zn) MG capsule Take 220 mg by mouth daily.     acetaminophen (TYLENOL) 325 MG tablet Take 650 mg by mouth every 6 (six) hours as needed for mild pain or headache.     diphenhydrAMINE (BENADRYL) 2 % cream Apply 1 application topically 3 (three) times daily as needed for itching.     docusate sodium (COLACE) 100 MG capsule Take 1 capsule (100 mg total) by mouth 2 (two) times daily as needed for mild constipation. 10 capsule 0   Ensure Max Protein (ENSURE MAX PROTEIN) LIQD Take 330 mLs (11 oz total) by mouth 2 (two) times daily. (Patient  not taking: Reported on 05/05/2020)     OLANZapine (ZYPREXA) 10 MG tablet Take 1 tablet (10 mg total) by mouth at bedtime. 30 tablet 0    Musculoskeletal: Strength & Muscle Tone: decreased and unable to assess. Pt states she has not walked since admission.  Gait & Station: unable to assess.  Patient leans: Backward  Psychiatric Specialty Exam: Physical Exam Vitals and nursing note reviewed.  Psychiatric:        Attention and Perception: Perception normal.        Speech: Speech is tangential.        Behavior: Behavior is cooperative.        Judgment: Judgment is impulsive.     Review of Systems  Psychiatric/Behavioral: Positive for confusion.  All other systems reviewed and are negative.   Blood pressure (!) 152/95, pulse 81, temperature 98 F (36.7 C), temperature source Oral, resp. rate 20, height 5\' 6"  (1.676 m), weight 92.5 kg, SpO2 92 %.Body mass index is 32.91 kg/m.  General Appearance: Casual  Eye Contact:  Fair  Speech:  Clear and Coherent  Volume:  Normal  Mood:  Irritable  Affect:  Blunt and Congruent  Thought Process:  Disorganized and Descriptions of Associations: Circumstantial  Orientation:  Other:  person, place; partially to time (year, day of the week)  Thought Content:  Tangential  Suicidal Thoughts:  No  Homicidal Thoughts:  No  Memory:  Immediate;   Fair Recent;   Fair Remote;   Fair  Judgement:  Impaired  Insight:  Lacking and Shallow  Psychomotor Activity:  NA  Concentration:  Concentration: Fair and Attention Span: Fair  Recall:  of Knowledge:  Fair  Language:  Fair  Akathisia:  No  Handed:  Right  AIMS (if indicated):     Assets:  Communication Skills Desire for Improvement Resilience  ADL's:  Impaired  Cognition:  Impaired,  Mild  Sleep:      Treatment Plan Summary: Daily contact with patient to assess and evaluate symptoms and progress in treatment and Medication management  Disposition: No evidence of imminent risk to  self or others at present.   Patient does not meet  criteria for psychiatric inpatient admission.  This service was provided via telemedicine using a 2-way, interactive audio and video technology.  Names of all persons participating in this telemedicine service and their role in this encounter. Name: Maxie Barb Role: NP  Name: Nelly Rout Role: MD  Name: Becky Hendricks Role: brother  Name: Becky Hendricks Role: patient    Loletta Parish, NP 05/25/2020 1:22 PM

## 2020-05-25 NOTE — TOC Transition Note (Signed)
Transition of Care [facility]) - CM/SW Discharge Note   Patient Details  Name: Becky Hendricks MRN: [id number] Date of Birth: 09/10/1945  [facility] CM/SW Contact:  Elliot Cousin, RN Phone Number: [phone] 05/25/2020, 3:24 PM   Clinical Narrative:    Facetime assessment was completed with [name] Admission Coordinator, DON and her aide that assist her at the facility. Pt recognized aide. They completed assessment and will get back to CM if they will accept back today.   230 pm [name] will accept pt back to the facility today. Will fax AVS and call PTAR. Requested report be called to [phone] room 804B. ED provider updated.      Final next level of care: Skilled Nursing Facility Barriers to Discharge: No Barriers Identified   Patient Goals and CMS Choice Patient states their goals for this hospitalization and ongoing recovery are:: will discharge back to LTC facility      Discharge Placement                Patient to be transferred to facility by: ambulance Name of family member notified: Asiana Benninger [phone] Patient and family notified of of transfer: 05/25/20  Discharge Plan and Services                                     Social Determinants of Health (SDOH) Interventions     Readmission Risk Interventions No flowsheet data found.

## 2020-06-27 ENCOUNTER — Other Ambulatory Visit: Payer: Self-pay

## 2020-06-27 ENCOUNTER — Ambulatory Visit (INDEPENDENT_AMBULATORY_CARE_PROVIDER_SITE_OTHER): Payer: Medicare Other | Admitting: Infectious Disease

## 2020-06-27 VITALS — BP 115/79 | HR 102 | Temp 97.7°F

## 2020-06-27 DIAGNOSIS — Z1159 Encounter for screening for other viral diseases: Secondary | ICD-10-CM

## 2020-06-27 DIAGNOSIS — L89304 Pressure ulcer of unspecified buttock, stage 4: Secondary | ICD-10-CM | POA: Diagnosis not present

## 2020-06-27 DIAGNOSIS — M869 Osteomyelitis, unspecified: Secondary | ICD-10-CM | POA: Diagnosis not present

## 2020-06-27 DIAGNOSIS — Z6841 Body Mass Index (BMI) 40.0 and over, adult: Secondary | ICD-10-CM

## 2020-06-27 DIAGNOSIS — F3160 Bipolar disorder, current episode mixed, unspecified: Secondary | ICD-10-CM

## 2020-06-27 DIAGNOSIS — U071 COVID-19: Secondary | ICD-10-CM

## 2020-06-27 DIAGNOSIS — Z114 Encounter for screening for human immunodeficiency virus [HIV]: Secondary | ICD-10-CM

## 2020-06-27 DIAGNOSIS — J80 Acute respiratory distress syndrome: Secondary | ICD-10-CM
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
## 2020-06-28 LAB — CBC WITH DIFFERENTIAL/PLATELET
Absolute Monocytes: 725 cells/uL (ref 200–950)
Basophils Absolute: 39 cells/uL (ref 0–200)
Basophils Relative: 0.5 %
Eosinophils Absolute: 242 cells/uL (ref 15–500)
Eosinophils Relative: 3.1 %
HCT: 36.2 % (ref 35.0–45.0)
Hemoglobin: 11.8 g/dL (ref 11.7–15.5)
Lymphs Abs: 3268 cells/uL (ref 850–3900)
MCH: 29.4 pg (ref 27.0–33.0)
MCHC: 32.6 g/dL (ref 32.0–36.0)
MCV: 90.3 fL (ref 80.0–100.0)
MPV: 12 fL (ref 7.5–12.5)
Monocytes Relative: 9.3 %
Neutro Abs: 3526 cells/uL (ref 1500–7800)
Neutrophils Relative %: 45.2 %
Platelets: 179 10*3/uL (ref 140–400)
RBC: 4.01 10*6/uL (ref 3.80–5.10)
RDW: 14.2 % (ref 11.0–15.0)
Total Lymphocyte: 41.9 %
WBC: 7.8 10*3/uL (ref 3.8–10.8)

## 2020-06-28 LAB — HEPATITIS C ANTIBODY
Hepatitis C Ab: NONREACTIVE
SIGNAL TO CUT-OFF: 0.01 (ref <1.00)

## 2020-06-28 LAB — BASIC METABOLIC PANEL WITH GFR
BUN: 17 mg/dL (ref 7–25)
CO2: 35 mmol/L — ABNORMAL HIGH (ref 20–32)
Calcium: 9.1 mg/dL (ref 8.6–10.4)
Chloride: 98 mmol/L (ref 98–110)
Creat: 0.63 mg/dL (ref 0.60–0.93)
GFR, Est African American: 104 mL/min/{1.73_m2} (ref >=60)
GFR, Est Non African American: 90 mL/min/{1.73_m2} (ref >=60)
Glucose, Bld: 83 mg/dL (ref 65–99)
Potassium: 4.2 mmol/L (ref 3.5–5.3)
Sodium: 142 mmol/L (ref 135–146)

## 2020-06-28 LAB — SEDIMENTATION RATE: Sed Rate: 11 mm/h (ref 0–30)

## 2020-06-28 LAB — C-REACTIVE PROTEIN: CRP: 6 mg/L (ref <8.0)

## 2020-06-28 LAB — HIV ANTIBODY (ROUTINE TESTING W REFLEX): HIV 1&2 Ab, 4th Generation: NONREACTIVE

## 2020-06-28 LAB — RPR: RPR Ser Ql: NONREACTIVE

## 2020-07-13 ENCOUNTER — Telehealth: Payer: Self-pay

## 2020-07-13 NOTE — Telephone Encounter (Signed)
Received call today from [facility] Rn regarding updates on patient's osteo. RN states two weeks ago patient's wound has been getting gradually larger. MRI pending.  Pt is following wound care provider, but RN is requesting appointment with ID for follow up.  Call was transferred to the front to schedule appointment.  RN requesting call back with any suggestions provider might have. [phone] Lorenso Courier, CMA

## 2020-07-13 NOTE — Telephone Encounter (Signed)
She may have to see one of my partners or NP as I won't be back in clinic until right before Xmas

## 2020-07-14 ENCOUNTER — Telehealth: Payer: Self-pay

## 2020-07-14 NOTE — Telephone Encounter (Signed)
Called nursing home to get patient an earlier appointment scheduled with a different provider, so she can be seen earlier per provider.. Number I called was [phone] for direct number for scheduler, left a voicemail to call back

## 2020-07-25 ENCOUNTER — Other Ambulatory Visit: Payer: Self-pay | Admitting: Gerontology

## 2020-07-26 ENCOUNTER — Other Ambulatory Visit: Payer: Self-pay

## 2020-07-26 ENCOUNTER — Encounter: Payer: Self-pay | Admitting: Infectious Diseases

## 2020-07-26 ENCOUNTER — Ambulatory Visit (INDEPENDENT_AMBULATORY_CARE_PROVIDER_SITE_OTHER): Payer: Medicare Other | Admitting: Infectious Diseases

## 2020-07-26 DIAGNOSIS — T8189XA Other complications of procedures, not elsewhere classified, initial encounter: Secondary | ICD-10-CM

## 2020-07-26 DIAGNOSIS — S31000A Unspecified open wound of lower back and pelvis without penetration into retroperitoneum, initial encounter: Secondary | ICD-10-CM

## 2020-07-26 DIAGNOSIS — L89156 Pressure-induced deep tissue damage of sacral region: Secondary | ICD-10-CM | POA: Diagnosis not present

## 2020-07-26 DIAGNOSIS — Z23 Encounter for immunization: Secondary | ICD-10-CM

## 2020-07-26 DIAGNOSIS — M868X8 Other osteomyelitis, other site: Secondary | ICD-10-CM | POA: Diagnosis not present

## 2020-07-26 NOTE — Assessment & Plan Note (Signed)
No signs and symptoms of active infection Wound care  Off loading  CRP and ESR today  Fu PRN

## 2020-07-26 NOTE — Assessment & Plan Note (Signed)
Flu vaccine today 

## 2020-07-26 NOTE — Progress Notes (Signed)
[facility]                                                             [place], [place], [place], [address]                                                                  Phn. [phone]; Fax: [phone]                                                                             Date: 07/26/20   Reason for Referral:Sacral Ulcer Referring Provider: [name]   Assessment 74 YO Female with a complicated medical history with A Fib, Bipolar d/o, HTN, DM, Obesity, Hx of C diff and COVID 19 and h/o decubitus sacral ulcer/osteomyelitis s/p treatment completed in 01/2020 who is referred for evaluation of Chronic Sacral wound.   I did take a picture of her sacral wound today for comparison in the future, somehow it was not saved. The wound although went deep down into the bone, it looked clean and did not look actively infected. Patient does not have any systemic symptoms of infection currently. I showed the wound to his brother and he was impressed the way the wound looked today. Of note, this wound has been treated with 6 weeks of IV abx which she completed in 01/2020   I don't think an MRI of his sacrum is indicated currently given how the wound looks now.   I will be very careful with antibiotics given her h/o C diff and high risk for recurrence  She needs good wound care and off loading.   I am not sure how long will it take for her to be ambulatory again although it seems she has gained some function after working with PT. I also told patient and her brother that the wound might not heal completely as long as she is bedridden.   Will get ESR and CRP today   Fu with wound care   Fu with [place] as needed   All questions and concerns were discussed and addressed. Patient verbalized understanding of the  plan. ____________________________________________________________________________________________________________________  HPI: 74 YO Female with a complicated medical history with A Fib, Bipolar d/o, HTN, DM, Obesity, Hx of C diff and COVID 19 in January 2021, also developed sacral stage 4 ulcer and osteomyelitis admitted in late April where she was initiated on vancomycin and amp/sub and subseqeuntly discharged on 5/6 but readmitted in 72hr for cardiac arrest/CPR code- resuscitated and successfullly extubated on by 5/11. She also developed rash which was thought to be drug related. She underwent surgical debridement for her sacral wound with polymicrobial cultures and completed 6 weeks course of IV Vancomycin and Unasyn through 01/21/20.  She has  been followed by Dr [facility] and was recommended to be monitored off antibiotics. On review of her prior pictures of her sacral ulcers, her sacral ulcer does not seem to be infected to me. There is no active drainage/surrounding erythema or induration. The wound looks clean although it goes deep into the bone.   Patient is accompanied by her brother. They say patient was sent on request of Anderson Malta( wound care RN) as wound was getting larger. She was also supposed to get an MRI but patient denies getting it. Patient does not complain of any pain/tenderness/discharge or drainage from the sacral wound. Denies any fever/chills and sweats. Denies any nausea/vomiting and diarrhea.   She currently stays at [facility] and follows with the wound care nurse over there. Patient is also doing PT and says that she is able to go walk somewhat with the help of rolling walker now which she was not able to do. She says she was last ambulatory approx a year ago.   Brother is not too happy about the care she is getting at [facility]. He says all the staffs are not equally good in taking care of needs of the residents. For example - She will not get turned as  frequently as it should be. He also says that they are possibly short staffed. He is frustrated as he feels there is no other better facility where he can take her.   Patient is using diapers and says that she is not able to hold urine and stool. She does not have any concerns of the sacral wound.   ROS: 11 point ROS negative except as above  Past Medical History:  Diagnosis Date  . Atrial fibrillation ([place])   . Bipolar disorder ([place])   . Bipolar disorder ([place]) 04/27/2020  . Clostridium difficile diarrhea 08/02/2019  . Dehydration   . Essential hypertension   . Fall 02/11/2020  . Morbid obesity ([place])   . Type 2 diabetes mellitus ([place])   . Weakness    Past Surgical History:  Procedure Laterality Date  . INCISION AND DRAINAGE PERIRECTAL ABSCESS Left 12/04/2019   Procedure: IRRIGATION AND DEBRIDEMENT LEFT BUTTOCK ABSCESS;  Surgeon: Georganna Skeans, MD;  Location: [facility];  Service: General;  Laterality: Left;  . INCISION AND DRAINAGE PERIRECTAL ABSCESS Left 12/10/2019   Procedure: REPEAT IRRIGATION AND DEBRIDEMENT BUTTOCK  ABSCESS;  Surgeon: Coralie Keens, MD;  Location: [facility];  Service: General;  Laterality: Left;   Current Outpatient Medications on File Prior to Visit  Medication Sig Dispense Refill  . acetaminophen (TYLENOL) 325 MG tablet Take 650 mg by mouth every 6 (six) hours as needed for mild pain or headache.    . Amino Acids-Protein Hydrolys (FEEDING SUPPLEMENT, PRO-STAT SUGAR FREE 64,) LIQD Take 30 mLs by mouth 2 (two) times daily. 887 mL 0  . apixaban (ELIQUIS) 5 MG TABS tablet Take 5 mg by mouth 2 (two) times daily.    Marland Kitchen ascorbic acid (VITAMIN C) 500 MG tablet Take 500 mg by mouth daily.     Marland Kitchen atorvastatin (LIPITOR) 10 MG tablet Take 10 mg by mouth at bedtime.    . calcium carbonate (TUMS - DOSED IN MG ELEMENTAL CALCIUM) 500 MG chewable tablet Chew 1 tablet by mouth daily.    . carvedilol (COREG) 25 MG tablet Take 12.5 mg by mouth 2 (two) times daily with a meal.    .  Cholecalciferol (VITAMIN D) 125 MCG (5000 UT) CAPS Take 10,000 Units by mouth once a  week. Wednesday    . diphenhydrAMINE (BENADRYL) 2 % cream Apply 1 application topically 3 (three) times daily as needed for itching.    . divalproex (DEPAKOTE SPRINKLE) 125 MG capsule Take 125 mg by mouth daily. Every afternoon.    . divalproex (DEPAKOTE) 500 MG DR tablet Take 500 mg by mouth 2 (two) times daily.    Marland Kitchen docusate sodium (COLACE) 100 MG capsule Take 1 capsule (100 mg total) by mouth 2 (two) times daily as needed for mild constipation. 10 capsule 0  . Emollient (EUCERIN INTENSIVE REPAIR) LOTN Apply 1 application topically daily. To both knees for rash.    . Ensure Max Protein (ENSURE MAX PROTEIN) LIQD Take 330 mLs (11 oz total) by mouth 2 (two) times daily. (Patient not taking: Reported on 05/05/2020)    . furosemide (LASIX) 40 MG tablet Take 40 mg by mouth daily.     . hydrALAZINE (APRESOLINE) 25 MG tablet Take 1 tablet (25 mg total) by mouth every 8 (eight) hours.    . hydrocortisone cream 1 % Apply topically 2 (two) times daily. (Patient taking differently: Apply 1 application topically 2 (two) times daily. ) 30 g 0  . insulin glargine (LANTUS) 100 UNIT/ML injection Inject 10 Units into the skin at bedtime. Notify PEC for CBG less than 100 or greater than 250.    Marland Kitchen Ipratropium-Albuterol (COMBIVENT RESPIMAT) 20-100 MCG/ACT AERS respimat Inhale 1 puff into the lungs every 6 (six) hours.    Marland Kitchen LORazepam (ATIVAN) 0.5 MG tablet Take 0.5 mg by mouth 2 (two) times daily as needed for anxiety.    Marland Kitchen losartan (COZAAR) 25 MG tablet Take 50 mg by mouth daily.    . Multiple Vitamin (MULTIVITAMIN WITH MINERALS) TABS tablet Take 1 tablet by mouth daily.    . NON FORMULARY Take 120 mLs by mouth daily. Medpass to promote healing    . nutrition supplement, JUVEN, (JUVEN) PACK Take 1 packet by mouth 2 (two) times daily between meals.  0  . OLANZapine (ZYPREXA) 10 MG tablet Take 1 tablet (10 mg total) by mouth at bedtime.  30 tablet 0  . pantoprazole (PROTONIX) 40 MG tablet Take 1 tablet (40 mg total) by mouth 2 (two) times daily. (Patient taking differently: Take 40 mg by mouth daily. ) 30 tablet 0  . sennosides-docusate sodium (SENOKOT-S) 8.6-50 MG tablet Take 1 tablet by mouth daily.    . sitaGLIPtin-metformin (JANUMET) 50-1000 MG tablet Take 1 tablet by mouth 2 (two) times daily with a meal.    . zinc sulfate 220 (50 Zn) MG capsule Take 220 mg by mouth daily.     No current facility-administered medications on file prior to visit.   Allergies  Allergen Reactions  . Chlorhexidine Gluconate Itching  . Acetazolamide Er Rash   Social History   Socioeconomic History  . Marital status: Single    Spouse name: Not on file  . Number of children: Not on file  . Years of education: Not on file  . Highest education level: Not on file  Occupational History  . Not on file  Tobacco Use  . Smoking status: Never Smoker  . Smokeless tobacco: Never Used  Vaping Use  . Vaping Use: Unknown  Substance and Sexual Activity  . Alcohol use: Not Currently  . Drug use: Not Currently  . Sexual activity: Not Currently    Birth control/protection: Post-menopausal  Other Topics Concern  . Not on file  Social History Narrative  . Not on file  Social Determinants of Health   Financial Resource Strain: Not on file  Food Insecurity: Not on file  Transportation Needs: Not on file  Physical Activity: Not on file  Stress: Not on file  Social Connections: Not on file  Intimate Partner Violence: Not on file     Vitals   Examination  General - not in acute distress, comfortably lying in the wheel chair HEENT - PEERLA, no pallor and no icterus Chest - b/l clear air entry, no additional sounds CVS- Normal s1s2, RRR Abdomen - Soft, Non tender , non distended Ext- no pedal edema, RT UPPER EXTREMITY HAS A CONTRACTURE Neuro: grossly non focal  Back - deep sacral ulcer tunneling into the bone but no  drainage/surrounding erythema or tenderness - wound looks clean  Psych : calm and cooperative   Recent labs CBC Latest Ref Rng & Units 06/27/2020 05/05/2020 04/27/2020  WBC 3.8 - 10.8 Thousand/uL 7.8 9.7 8.2  Hemoglobin 11.7 - 15.5 g/dL 11.8 11.3(L) 11.3(L)  Hematocrit 35.0 - 45.0 % 36.2 36.4 35.2  Platelets 140 - 400 Thousand/uL 179 204 252   CMP Latest Ref Rng & Units 06/27/2020 05/05/2020 04/27/2020  Glucose 65 - 99 mg/dL 83 120(H) 103(H)  BUN 7 - 25 mg/dL 17 25(H) 29(H)  Creatinine 0.60 - 0.93 mg/dL 0.63 0.61 0.63  Sodium 135 - 146 mmol/L 142 145 140  Potassium 3.5 - 5.3 mmol/L 4.2 4.1 5.0  Chloride 98 - 110 mmol/L 98 99 93(L)  CO2 20 - 32 mmol/L 35(H) 33(H) 40(H)  Calcium 8.6 - 10.4 mg/dL 9.1 9.5 9.7  Total Protein 6.5 - 8.1 g/dL - 7.2 -  Total Bilirubin 0.3 - 1.2 mg/dL - 0.4 -  Alkaline Phos 38 - 126 U/L - 37(L) -  AST 15 - 41 U/L - 14(L) -  ALT 0 - 44 U/L - 13 -      Pertinent Microbiology Results for orders placed or performed during the hospital encounter of 05/05/20  SARS Coronavirus 2 by RT PCR (hospital order, performed in [facility] hospital lab) Nasopharyngeal Nasopharyngeal Swab     Status: None   Collection Time: 05/05/20  6:34 PM   Specimen: Nasopharyngeal Swab  Result Value Ref Range Status   SARS Coronavirus 2 NEGATIVE NEGATIVE Final    Comment: (NOTE) SARS-CoV-2 target nucleic acids are NOT DETECTED.  The SARS-CoV-2 RNA is generally detectable in upper and lower respiratory specimens during the acute phase of infection. The lowest concentration of SARS-CoV-2 viral copies this assay can detect is 250 copies / mL. A negative result does not preclude SARS-CoV-2 infection and should not be used as the sole basis for treatment or other patient management decisions.  A negative result may occur with improper specimen collection / handling, submission of specimen other than nasopharyngeal swab, presence of viral mutation(s) within the areas targeted by this  assay, and inadequate number of viral copies (<250 copies / mL). A negative result must be combined with clinical observations, patient history, and epidemiological information.  Fact Sheet for Patients:   StrictlyIdeas.no  Fact Sheet for Healthcare Providers: BankingDealers.co.za  This test is not yet approved or  cleared by the [place] FDA and has been authorized for detection and/or diagnosis of SARS-CoV-2 by FDA under an Emergency Use Authorization (EUA).  This EUA will remain in effect (meaning this test can be used) for the duration of the COVID-19 declaration under Section 564(b)(1) of the Act, 21 U.S.C. section 360bbb-3(b)(1), unless the authorization is terminated or revoked  sooner.  Performed at [facility], [place] [address] [place], [place] [address]   Respiratory Panel by RT PCR (Flu A&B, Covid) - Nasopharyngeal Swab     Status: None   Collection Time: 05/13/20  5:51 AM   Specimen: Nasopharyngeal Swab  Result Value Ref Range Status   SARS Coronavirus 2 by RT PCR NEGATIVE NEGATIVE Final    Comment: (NOTE) SARS-CoV-2 target nucleic acids are NOT DETECTED.  The SARS-CoV-2 RNA is generally detectable in upper respiratoy specimens during the acute phase of infection. The lowest concentration of SARS-CoV-2 viral copies this assay can detect is 131 copies/mL. A negative result does not preclude SARS-Cov-2 infection and should not be used as the sole basis for treatment or other patient management decisions. A negative result may occur with  improper specimen collection/handling, submission of specimen other than nasopharyngeal swab, presence of viral mutation(s) within the areas targeted by this assay, and inadequate number of viral copies (<131 copies/mL). A negative result must be combined with clinical observations, patient history, and epidemiological information. The expected result is  Negative.  Fact Sheet for Patients:  PinkCheek.be  Fact Sheet for Healthcare Providers:  GravelBags.it  This test is no t yet approved or cleared by the [place] FDA and  has been authorized for detection and/or diagnosis of SARS-CoV-2 by FDA under an Emergency Use Authorization (EUA). This EUA will remain  in effect (meaning this test can be used) for the duration of the COVID-19 declaration under Section 564(b)(1) of the Act, 21 U.S.C. section 360bbb-3(b)(1), unless the authorization is terminated or revoked sooner.     Influenza A by PCR NEGATIVE NEGATIVE Final   Influenza B by PCR NEGATIVE NEGATIVE Final    Comment: (NOTE) The Xpert Xpress SARS-CoV-2/FLU/RSV assay is intended as an aid in  the diagnosis of influenza from Nasopharyngeal swab specimens and  should not be used as a sole basis for treatment. Nasal washings and  aspirates are unacceptable for Xpert Xpress SARS-CoV-2/FLU/RSV  testing.  Fact Sheet for Patients: PinkCheek.be  Fact Sheet for Healthcare Providers: GravelBags.it  This test is not yet approved or cleared by the [place] FDA and  has been authorized for detection and/or diagnosis of SARS-CoV-2 by  FDA under an Emergency Use Authorization (EUA). This EUA will remain  in effect (meaning this test can be used) for the duration of the  Covid-19 declaration under Section 564(b)(1) of the Act, 21  U.S.C. section 360bbb-3(b)(1), unless the authorization is  terminated or revoked. Performed at [facility], [place] [address], [place], [place] [address]      Pertinent Imaging CT abdomen/pelvis 12/20/2019 FINDINGS: Lower Chest: Small right pleural effusion. Bibasilar atelectasis, right side greater than left.  Hepatobiliary: No hepatic masses identified. Gallstones are seen, however there is no evidence of  cholecystitis or biliary dilatation.  Pancreas:  No mass or inflammatory changes.  Spleen: Within normal limits in size and appearance.  Adrenals/Urinary Tract: Small cyst again noted in the upper pole of right kidney no masses identified. No evidence of ureteral calculi or hydronephrosis. Foley catheter seen within the bladder, which is empty.  Stomach/Bowel: Nasogastric tube is seen with tip in the duodenal bulb. No evidence of obstruction, inflammatory process or abnormal fluid collections.  Vascular/Lymphatic: No pathologically enlarged lymph nodes. No abdominal aortic aneurysm. Congenital duplication of IVC incidentally noted.  Reproductive: Several uterine fibroids are seen, largest posteriorly measuring 4.3 cm. Adnexal regions are unremarkable.  Other: Previously seen fistula and abscess  in the left buttock is significantly decreased in size since previous study.  Musculoskeletal: A decubitus ulcer just to the right of midline along the gluteal crease remains unchanged, with stable appearance of osteomyelitis involving distal sacrum and coccyx.  IMPRESSION: 1. 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 2. Stable decubitus ulcer with osteomyelitis involving distal sacrum and coccyx. 3. Cholelithiasis. No radiographic evidence of cholecystitis. 4. Stable small uterine fibroids. 5. Increased small right pleural effusion and bibasilar atelectasis.   All pertinent labs/Imagings/notes reviewed. All pertinent plain films and CT images have been personally visualized and interpreted; radiology reports have been reviewed. Decision making incorporated into the Impression / Recommendations.  I spent greater than 25 minutes with the patient including  review of prior medical records with greater than 50% of time in face to face counsel of the patient.    Electronically signed by:  Rosiland Oz, MD Infectious Disease  Physician [facility] [address] [place], [place] [address] Phone: [phone]  Fax: [phone]

## 2020-08-03 ENCOUNTER — Ambulatory Visit: Payer: Medicare Other | Admitting: Infectious Disease

## 2020-09-02 ENCOUNTER — Other Ambulatory Visit: Payer: Self-pay | Admitting: Nurse Practitioner

## 2020-09-02 DIAGNOSIS — M869 Osteomyelitis, unspecified: Secondary | ICD-10-CM

## 2020-09-05 ENCOUNTER — Other Ambulatory Visit (HOSPITAL_COMMUNITY): Payer: Self-pay | Admitting: Nurse Practitioner

## 2020-09-05 DIAGNOSIS — M869 Osteomyelitis, unspecified: Secondary | ICD-10-CM

## 2020-09-21 ENCOUNTER — Other Ambulatory Visit: Payer: Self-pay

## 2020-09-21 ENCOUNTER — Ambulatory Visit (HOSPITAL_COMMUNITY)
Admission: RE | Admit: 2020-09-21 | Discharge: 2020-09-21 | Disposition: A | Discharge status: Home / Self Care | Payer: Medicare Other | Source: Ambulatory Visit | Attending: Nurse Practitioner | Admitting: Nurse Practitioner

## 2020-09-21 DIAGNOSIS — M869 Osteomyelitis, unspecified: Secondary | ICD-10-CM | POA: Diagnosis not present

## 2020-09-21 IMAGING — MR MR PELVIS WO/W CM
4 of 10 series · 19 of 48 positions shown · IV contrast (gadavist)
Comparison: None.

CLINICAL DATA: Sacral decubitus ulcer.

EXAM:
MRI PELVIS WITHOUT AND WITH CONTRAST
TECHNIQUE: Multiplanar multisequence MR imaging of the pelvis was performed
both before and after administration of intravenous contrast.
CONTRAST:  9mL GADAVIST GADOBUTROL 1 MMOL/ML IV SOLN

[Series 2: T2 · coronal · 4.0mm · 0.66mm/px · 5 of 48 slices shown]
[im 1/48]
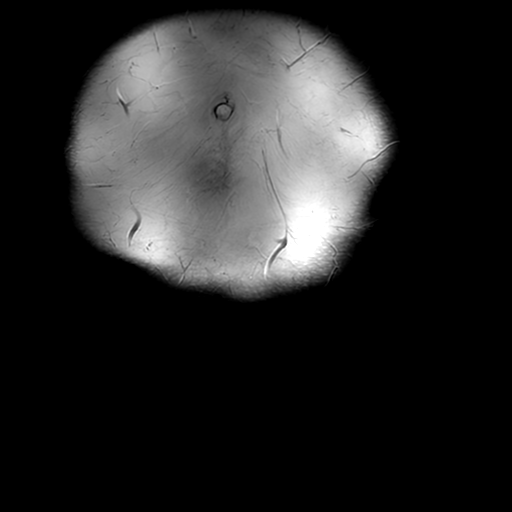
[im 12/48]
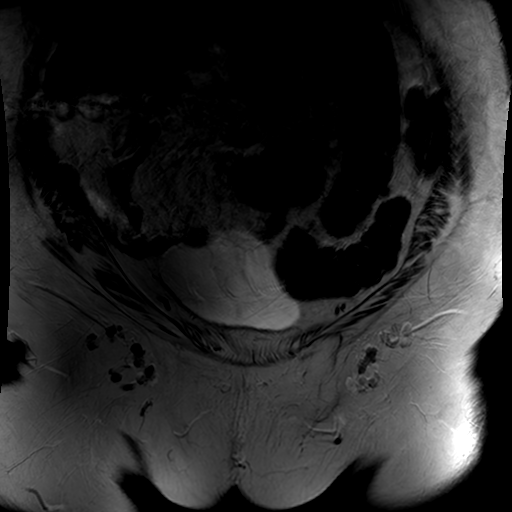
[im 24/48]
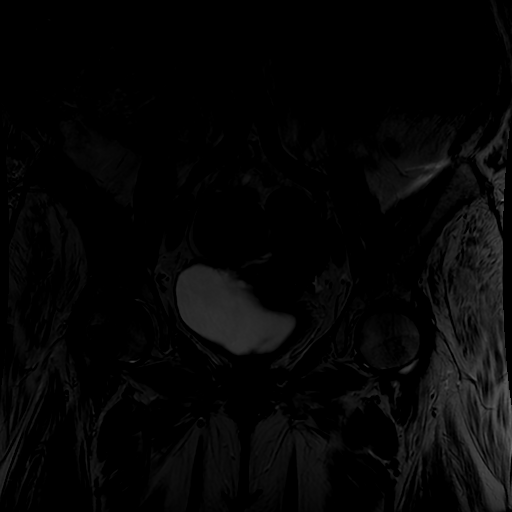
[im 36/48]
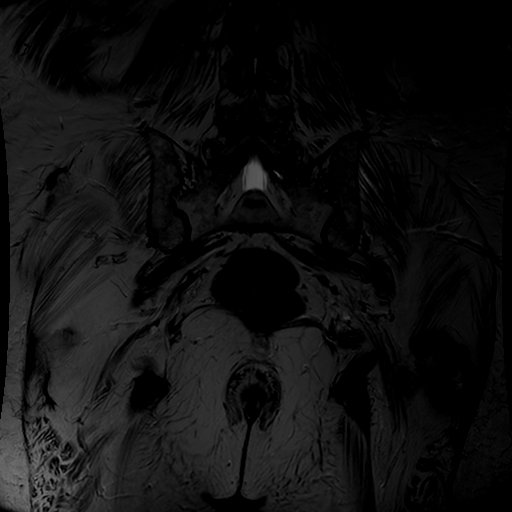
[im 48/48]
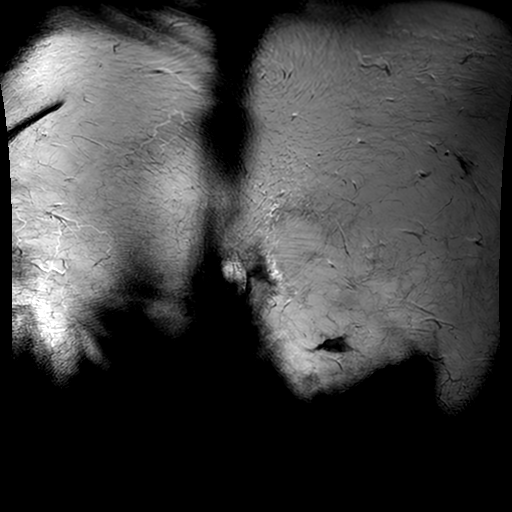

[Series 3: T2 fat-sat · axial · 4.0mm · 0.62mm/px · z∈[-144,+61]mm · 4 of 42 slices shown (1 of 2)]
[im 1/42]
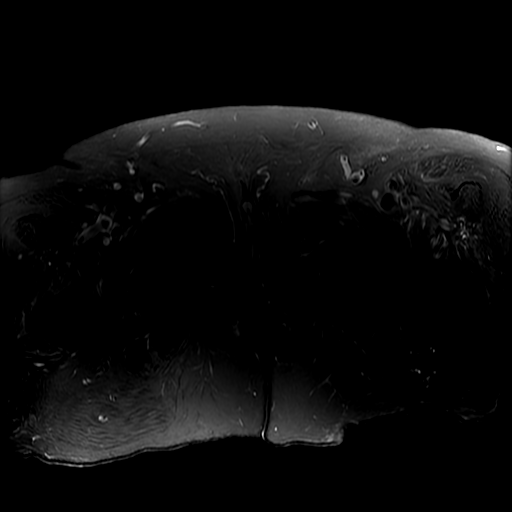
[im 14/42]
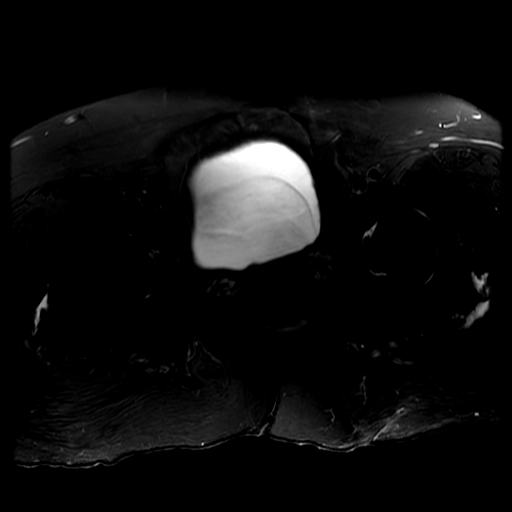
[im 28/42]
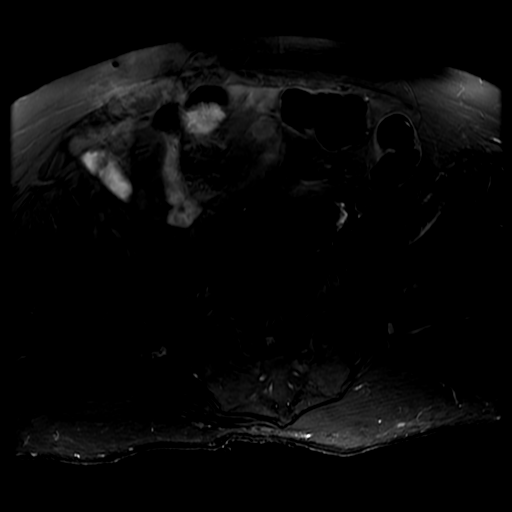
[im 42/42]
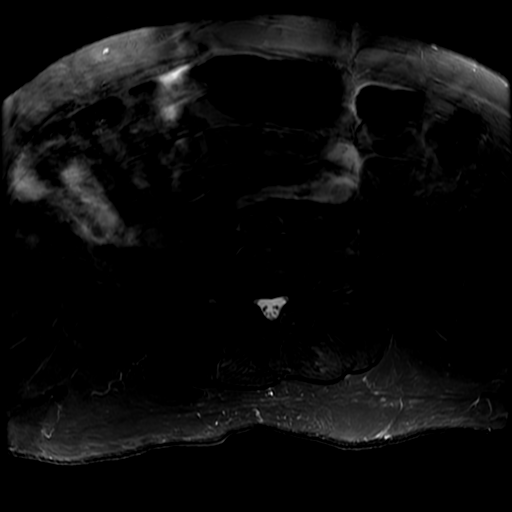

[Series 4: T2 fat-sat · sagittal · 4.0mm · 0.62mm/px · 6 of 62 slices shown (2 of 2)]
[im 1/62]
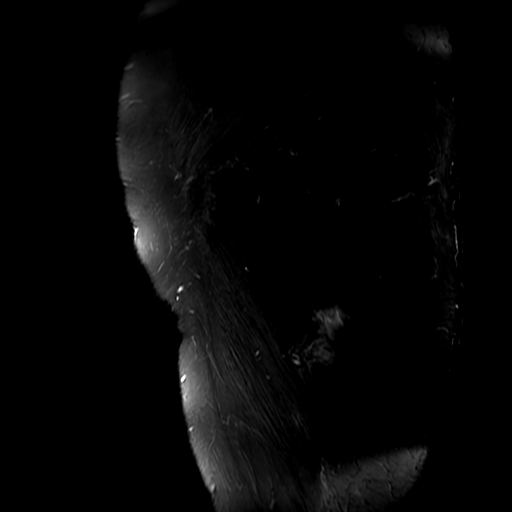
[im 13/62]
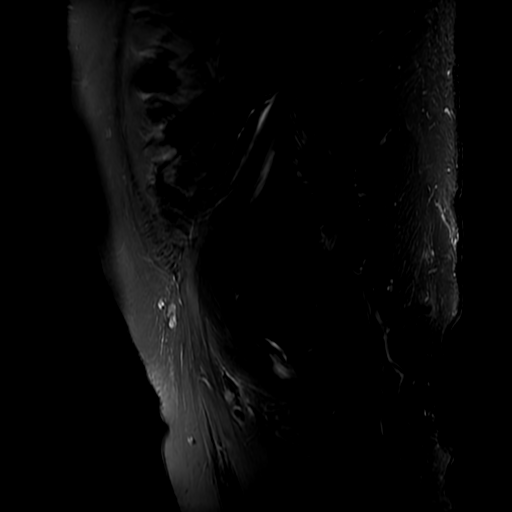
[im 25/62]
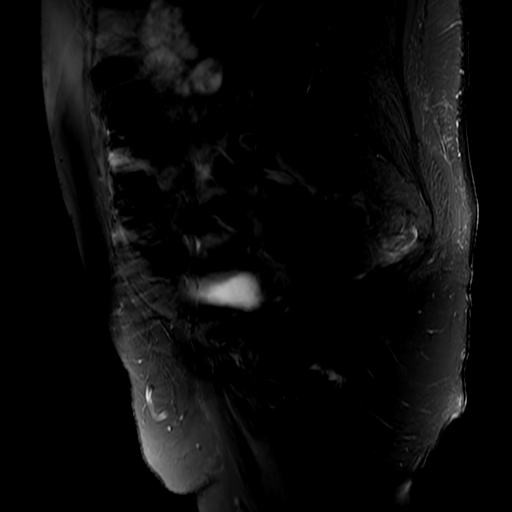
[im 37/62]
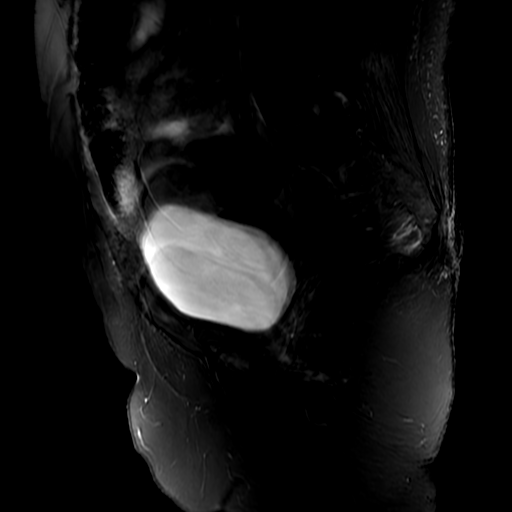
[im 49/62]
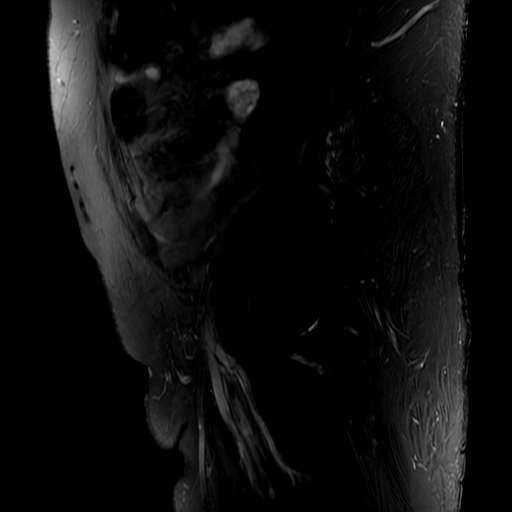
[im 62/62]
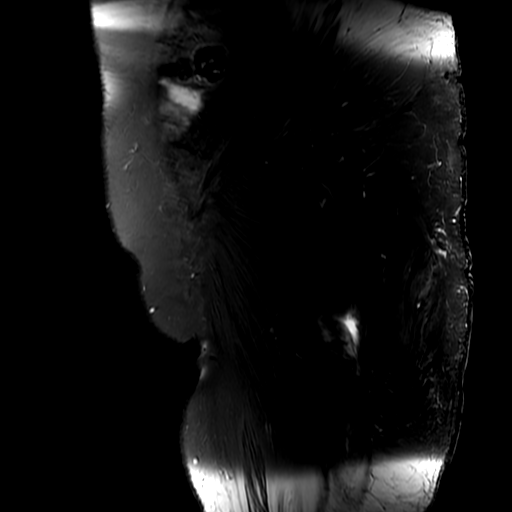

[Series 5: T1 fat-sat · axial · 4.0mm · 0.62mm/px · z∈[-154,+71]mm · 4 of 46 slices shown]
[im 1/46]
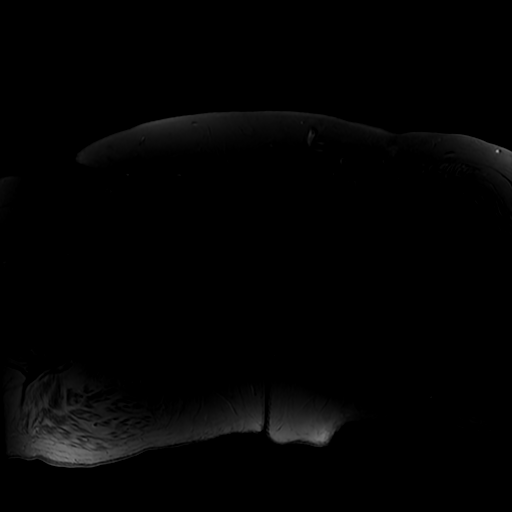
[im 12/46]
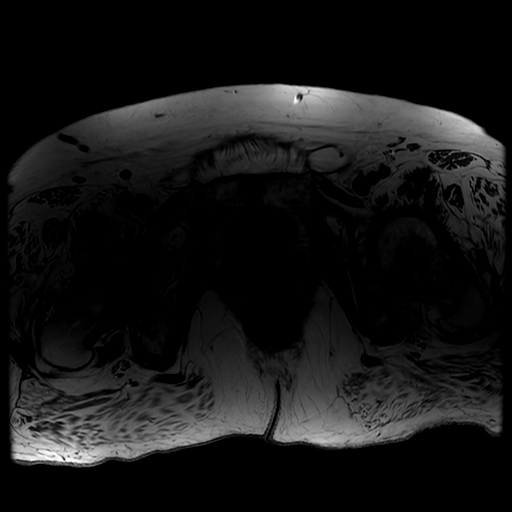
[im 23/46]
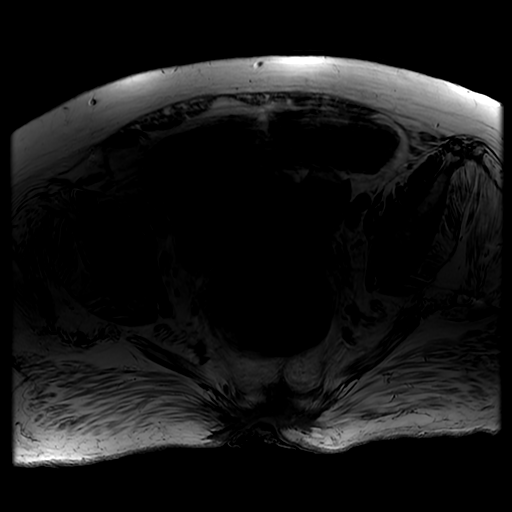
[im 46/46]
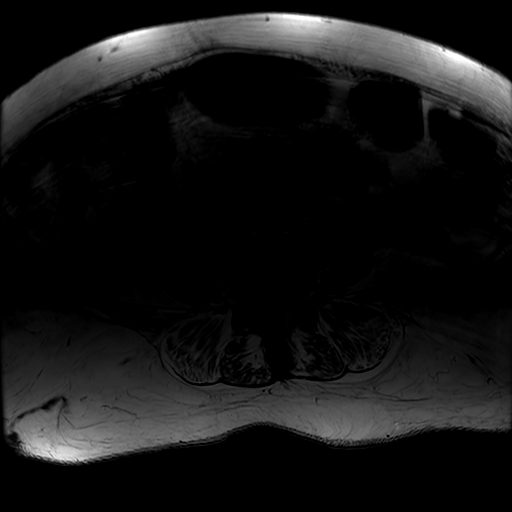

[19 of 48 positions shown; findings below may reference images not displayed]

FINDINGS: Urinary Tract: The bladder is unremarkable. No bladder mass or
calculi.

Bowel: The rectum, sigmoid colon and visualized small-bowel loops
are grossly normal.

Vascular/Lymphatic: No significant vascular abnormalities. Small
pelvic and inguinal lymph nodes are noted.

Reproductive: Fibroid uterus. The endometrium is normal in
thickness. The ovaries are not well visualized.

Other:  No free pelvic fluid collections or intrapelvic abscess.

Musculoskeletal: There is a deep sacral decubitus ulcer extending
down to the bone with there is osteomyelitis involving the distal
sacrum/coccyx. No drainable soft tissue abscess mild associated
enhancing granulation tissue and cellulitis.

Presacral inflammation/edema and fluid but no discrete presacral
abscess.

Marked fatty atrophy of the hip and pelvic musculature. Chronic
appearing tendinopathy involving the gluteal tendons and moderate
peritendinosis and mild trochanteric bursitis.

Both hips are normally located. Moderate degenerative changes. No
findings suspicious for septic arthritis or osteomyelitis. The pubic
symphysis and SI joints are intact. No findings suspicious for
septic arthritis.
IMPRESSION: 1. Deep sacral decubitus ulcer extending down to the bone with
osteomyelitis involving the distal sacrum/coccyx.
2. No drainable soft tissue abscess.
3. No findings suspicious for septic arthritis involving the hips or
SI joints.
4. Chronic appearing tendinopathy involving the gluteal tendons and
moderate peritendinosis and mild trochanteric bursitis.
5. Fibroid uterus.

## 2020-09-21 MED ORDER — GADOBUTROL 1 MMOL/ML IV SOLN
9.0000 mL | Freq: Once | INTRAVENOUS | Status: AC | PRN
  Administered 2020-09-21: 9 mL via INTRAVENOUS

## 2020-09-27 ENCOUNTER — Telehealth: Payer: Self-pay

## 2020-09-27 NOTE — Telephone Encounter (Signed)
Left message for [facility] requesting call back. Will need to schedule follow up appointment for patient.   Sandie Ano, RN

## 2020-09-27 NOTE — Telephone Encounter (Signed)
Received call from [facility], wound care nurse at [facility] place. She states patient's MRI from 09/21/20 is still showing acute osteomyelitis and wondering if provider needs to see patient or start antibiotics. MRI available to review in Epic. Will route to provider.   Sandie Ano, RN

## 2020-09-27 NOTE — Telephone Encounter (Signed)
Needs appt thanks!

## 2020-09-28 NOTE — Telephone Encounter (Signed)
I have spoke with the nurse at [facility] and scheduled the patient for a follow up appointment with Dr. Daiva Eves, who she has seen in the past. Patient is scheduled for 10/07/20. Becky Hendricks Becky Hendricks

## 2020-10-07 ENCOUNTER — Ambulatory Visit: Payer: Medicare Other | Admitting: Infectious Disease

## 2020-10-12 ENCOUNTER — Other Ambulatory Visit: Payer: Self-pay

## 2020-10-12 ENCOUNTER — Ambulatory Visit (INDEPENDENT_AMBULATORY_CARE_PROVIDER_SITE_OTHER): Payer: Medicare Other | Admitting: Internal Medicine

## 2020-10-12 ENCOUNTER — Encounter: Payer: Self-pay | Admitting: Internal Medicine

## 2020-10-12 VITALS — BP 154/81 | HR 67 | Temp 98.0°F | Wt 195.2 lb

## 2020-10-12 DIAGNOSIS — L89154 Pressure ulcer of sacral region, stage 4: Secondary | ICD-10-CM | POA: Diagnosis not present

## 2020-10-12 DIAGNOSIS — Z8739 Personal history of other diseases of the musculoskeletal system and connective tissue: Secondary | ICD-10-CM | POA: Diagnosis not present

## 2020-10-12 NOTE — Progress Notes (Signed)
[facility]                                                             [address], [place], [place], [address]                                                                  Phn. [phone]; Fax: [phone]                                                                             Date: 07/26/20   Reason for Referral:Sacral Ulcer Referring Provider: Roderic Scarce   Assessment Chronic sacral decub ulcer; hx om previously treated 01/2020 Clinically the infection had resolved (crp normal off abx, wound size smaller per family report)  Explained the pathogenesis of decub ulcer. Unclear why she is so debilitated without prior known neurologic compromise.  Discuss nature of recurrent infection in setting open ulcer if it is closed  Mri finding could show prior infection but clinically as above mentioned doesn't appear she has active infection.  -advise seeking neurology to assess for cidp -continue aggressive wound care -f/u as needed only  ________________________________________________  HPI: 75 YO Female [name] resident, with pmh A Fib, hx cardiac arrest 12/2019, Bipolar d/o, HTN, DM, Obesity, Hx of C diff (07/2019), and COVID 19 in January 2021, chronic sacral decub ulcer with OM prior treated here for f/u   Sacral decub ulcer/om: Previously seen rcid dr Daiva Eves and [facility]; last visit 07/2020 11/2019 dxed osteomyelitis; empiric vanc/unasyn. Had I&D polymicrobial on cx; finished 6 wk abx 01/21/20  Patient has been a living facility resident for the past year. Previously was ambulatory. Currently able to ambulate with help  crp trended down and had remained normal in 07/2020 without further abx since 01/2020  Per patient and her brother the wound has been closing. No foul odor.    ROS: 11 point ROS negative except as above  Past Medical History:  Diagnosis Date  .  Atrial fibrillation (HCC)   . Bipolar disorder (HCC)   . Bipolar disorder (HCC) 04/27/2020  . Clostridium difficile diarrhea 08/02/2019  . Dehydration   . Essential hypertension   . Fall 02/11/2020  . Morbid obesity (HCC)   . Type 2 diabetes mellitus (HCC)   . Weakness    Past Surgical History:  Procedure Laterality Date  . INCISION AND DRAINAGE PERIRECTAL ABSCESS Left 12/04/2019   Procedure: IRRIGATION AND DEBRIDEMENT LEFT BUTTOCK ABSCESS;  Surgeon: Violeta Gelinas, MD;  Location: [facility] OR;  Service: General;  Laterality: Left;  . INCISION AND DRAINAGE PERIRECTAL ABSCESS Left 12/10/2019   Procedure: REPEAT IRRIGATION AND DEBRIDEMENT BUTTOCK  ABSCESS;  Surgeon: Abigail Miyamoto, MD;  Location: [facility] OR;  Service: General;  Laterality: Left;  Current Outpatient Medications on File Prior to Visit  Medication Sig Dispense Refill  . acetaminophen (TYLENOL) 325 MG tablet Take 650 mg by mouth every 6 (six) hours as needed for mild pain or headache.    . Amino Acids-Protein Hydrolys (FEEDING SUPPLEMENT, PRO-STAT SUGAR FREE 64,) LIQD Take 30 mLs by mouth 2 (two) times daily. 887 mL 0  . apixaban (ELIQUIS) 5 MG TABS tablet Take 5 mg by mouth 2 (two) times daily.    Marland Kitchen ascorbic acid (VITAMIN C) 500 MG tablet Take 500 mg by mouth daily.     Marland Kitchen atorvastatin (LIPITOR) 10 MG tablet Take 10 mg by mouth at bedtime.    . calcium carbonate (TUMS - DOSED IN MG ELEMENTAL CALCIUM) 500 MG chewable tablet Chew 1 tablet by mouth daily.    . carvedilol (COREG) 25 MG tablet Take 12.5 mg by mouth 2 (two) times daily with a meal.    . Cholecalciferol (VITAMIN D) 125 MCG (5000 UT) CAPS Take 10,000 Units by mouth once a week. Wednesday    . diphenhydrAMINE (BENADRYL) 2 % cream Apply 1 application topically 3 (three) times daily as needed for itching.    . divalproex (DEPAKOTE SPRINKLE) 125 MG capsule Take 125 mg by mouth daily. Every afternoon.    . divalproex (DEPAKOTE) 500 MG DR tablet Take 500 mg by mouth 2 (two) times  daily.    Marland Kitchen docusate sodium (COLACE) 100 MG capsule Take 1 capsule (100 mg total) by mouth 2 (two) times daily as needed for mild constipation. 10 capsule 0  . Emollient (EUCERIN INTENSIVE REPAIR) LOTN Apply 1 application topically daily. To both knees for rash.    . Ensure Max Protein (ENSURE MAX PROTEIN) LIQD Take 330 mLs (11 oz total) by mouth 2 (two) times daily. (Patient not taking: Reported on 05/05/2020)    . furosemide (LASIX) 40 MG tablet Take 40 mg by mouth daily.     . hydrALAZINE (APRESOLINE) 25 MG tablet Take 1 tablet (25 mg total) by mouth every 8 (eight) hours.    . hydrocortisone cream 1 % Apply topically 2 (two) times daily. (Patient taking differently: Apply 1 application topically 2 (two) times daily.) 30 g 0  . insulin glargine (LANTUS) 100 UNIT/ML injection Inject 10 Units into the skin at bedtime. Notify PEC for CBG less than 100 or greater than 250.    Marland Kitchen Ipratropium-Albuterol (COMBIVENT RESPIMAT) 20-100 MCG/ACT AERS respimat Inhale 1 puff into the lungs every 6 (six) hours.    Marland Kitchen LORazepam (ATIVAN) 0.5 MG tablet Take 0.5 mg by mouth 2 (two) times daily as needed for anxiety.    Marland Kitchen losartan (COZAAR) 25 MG tablet Take 50 mg by mouth daily.    . Multiple Vitamin (MULTIVITAMIN WITH MINERALS) TABS tablet Take 1 tablet by mouth daily.    . NON FORMULARY Take 120 mLs by mouth daily. Medpass to promote healing    . nutrition supplement, JUVEN, (JUVEN) PACK Take 1 packet by mouth 2 (two) times daily between meals.  0  . OLANZapine (ZYPREXA) 10 MG tablet Take 1 tablet (10 mg total) by mouth at bedtime. 30 tablet 0  . pantoprazole (PROTONIX) 40 MG tablet Take 1 tablet (40 mg total) by mouth 2 (two) times daily. (Patient taking differently: Take 40 mg by mouth daily.) 30 tablet 0  . sennosides-docusate sodium (SENOKOT-S) 8.6-50 MG tablet Take 1 tablet by mouth daily.    . sitaGLIPtin-metformin (JANUMET) 50-1000 MG tablet Take 1 tablet by mouth 2 (two) times  daily with a meal.    . zinc  sulfate 220 (50 Zn) MG capsule Take 220 mg by mouth daily.     No current facility-administered medications on file prior to visit.   Allergies  Allergen Reactions  . Chlorhexidine Gluconate Itching  . Acetazolamide Er Rash   Social History   Socioeconomic History  . Marital status: Single    Spouse name: Not on file  . Number of children: Not on file  . Years of education: Not on file  . Highest education level: Not on file  Occupational History  . Not on file  Tobacco Use  . Smoking status: Never Smoker  . Smokeless tobacco: Never Used  Vaping Use  . Vaping Use: Unknown  Substance and Sexual Activity  . Alcohol use: Not Currently  . Drug use: Not Currently  . Sexual activity: Not Currently    Birth control/protection: Post-menopausal  Other Topics Concern  . Not on file  Social History Narrative  . Not on file   Social Determinants of Health   Financial Resource Strain: Not on file  Food Insecurity: Not on file  Transportation Needs: Not on file  Physical Activity: Not on file  Stress: Not on file  Social Connections: Not on file  Intimate Partner Violence: Not on file    Examination  General - not in acute distress, in stretcher HEENT - PEERLA, no pallor and no icterus Chest - b/l clear air entry, no additional sounds CVS- Normal s1s2, RRR Abdomen - Soft, Non tender , non distended Ext- no pedal edema, RT UPPER EXTREMITY HAS A CONTRACTURE Neuro: grossly non focal  Back - not able to examine as she is in stretcher Psych : flight of ideas   Recent labs CBC Latest Ref Rng & Units 06/27/2020 05/05/2020 04/27/2020  WBC 3.8 - 10.8 Thousand/uL 7.8 9.7 8.2  Hemoglobin 11.7 - 15.5 g/dL 16.111.8 11.3(L) 11.3(L)  Hematocrit 35.0 - 45.0 % 36.2 36.4 35.2  Platelets 140 - 400 Thousand/uL 179 204 252   CMP Latest Ref Rng & Units 06/27/2020 05/05/2020 04/27/2020  Glucose 65 - 99 mg/dL 83 096(E120(H) 454(U103(H)  BUN 7 - 25 mg/dL 17 98(J25(H) 19(J29(H)  Creatinine 0.60 - 0.93 mg/dL 4.780.63  2.950.61 6.210.63  Sodium 135 - 146 mmol/L 142 145 140  Potassium 3.5 - 5.3 mmol/L 4.2 4.1 5.0  Chloride 98 - 110 mmol/L 98 99 93(L)  CO2 20 - 32 mmol/L 35(H) 33(H) 40(H)  Calcium 8.6 - 10.4 mg/dL 9.1 9.5 9.7  Total Protein 6.5 - 8.1 g/dL - 7.2 -  Total Bilirubin 0.3 - 1.2 mg/dL - 0.4 -  Alkaline Phos 38 - 126 U/L - 37(L) -  AST 15 - 41 U/L - 14(L) -  ALT 0 - 44 U/L - 13 -      Pertinent Microbiology Results for orders placed or performed during the hospital encounter of 05/05/20  SARS Coronavirus 2 by RT PCR (hospital order, [facility] lab) Nasopharyngeal Nasopharyngeal Swab     Status: None   Collection Time: 05/05/20  6:34 PM   Specimen: Nasopharyngeal Swab  Result Value Ref Range Status   SARS Coronavirus 2 NEGATIVE NEGATIVE Final    Comment: (NOTE) SARS-CoV-2 target nucleic acids are NOT DETECTED.  The SARS-CoV-2 RNA is generally detectable in upper and lower respiratory specimens during the acute phase of infection. The lowest concentration of SARS-CoV-2 viral copies this assay can detect is 250 copies / mL. A negative result does not  preclude SARS-CoV-2 infection and should not be used as the sole basis for treatment or other patient management decisions.  A negative result may occur with improper specimen collection / handling, submission of specimen other than nasopharyngeal swab, presence of viral mutation(s) within the areas targeted by this assay, and inadequate number of viral copies (<250 copies / mL). A negative result must be combined with clinical observations, patient history, and epidemiological information.  Fact Sheet for Patients:   BoilerBrush.com.cy  Fact Sheet for Healthcare Providers: https://pope.com/  This test is not yet approved or  cleared by the [place] FDA and has been authorized for detection and/or diagnosis of SARS-CoV-2 by FDA under an Emergency Use Authorization  (EUA).  This EUA will remain in effect (meaning this test can be used) for the duration of the COVID-19 declaration under Section 564(b)(1) of the Act, 21 U.S.C. section 360bbb-3(b)(1), unless the authorization is terminated or revoked sooner.  Performed at [facility], [address], [place], [place] [address]   Respiratory Panel by RT PCR (Flu A&B, Covid) - Nasopharyngeal Swab     Status: None   Collection Time: 05/13/20  5:51 AM   Specimen: Nasopharyngeal Swab  Result Value Ref Range Status   SARS Coronavirus 2 by RT PCR NEGATIVE NEGATIVE Final    Comment: (NOTE) SARS-CoV-2 target nucleic acids are NOT DETECTED.  The SARS-CoV-2 RNA is generally detectable in upper respiratoy specimens during the acute phase of infection. The lowest concentration of SARS-CoV-2 viral copies this assay can detect is 131 copies/mL. A negative result does not preclude SARS-Cov-2 infection and should not be used as the sole basis for treatment or other patient management decisions. A negative result may occur with  improper specimen collection/handling, submission of specimen other than nasopharyngeal swab, presence of viral mutation(s) within the areas targeted by this assay, and inadequate number of viral copies (<131 copies/mL). A negative result must be combined with clinical observations, patient history, and epidemiological information. The expected result is Negative.  Fact Sheet for Patients:  https://www.moore.com/  Fact Sheet for Healthcare Providers:  https://www.young.biz/  This test is no t yet approved or cleared by the [place] FDA and  has been authorized for detection and/or diagnosis of SARS-CoV-2 by FDA under an Emergency Use Authorization (EUA). This EUA will remain  in effect (meaning this test can be used) for the duration of the COVID-19 declaration under Section 564(b)(1) of the Act, 21 U.S.C. section  360bbb-3(b)(1), unless the authorization is terminated or revoked sooner.     Influenza A by PCR NEGATIVE NEGATIVE Final   Influenza B by PCR NEGATIVE NEGATIVE Final    Comment: (NOTE) The Xpert Xpress SARS-CoV-2/FLU/RSV assay is intended as an aid in  the diagnosis of influenza from Nasopharyngeal swab specimens and  should not be used as a sole basis for treatment. Nasal washings and  aspirates are unacceptable for Xpert Xpress SARS-CoV-2/FLU/RSV  testing.  Fact Sheet for Patients: https://www.moore.com/  Fact Sheet for Healthcare Providers: https://www.young.biz/  This test is not yet approved or cleared by the [place] FDA and  has been authorized for detection and/or diagnosis of SARS-CoV-2 by  FDA under an Emergency Use Authorization (EUA). This EUA will remain  in effect (meaning this test can be used) for the duration of the  Covid-19 declaration under Section 564(b)(1) of the Act, 21  U.S.C. section 360bbb-3(b)(1), unless the authorization is  terminated or revoked. Performed at [facility], [address]  [place], [place] [address]    CrP: 09/2019     1.7 (<1) 02/2020   23.2 (<8) 04/2020     0.7 06/2020     6.0 (<8)  Pertinent Imaging 09/21/20 pelvis mri wwo contrast 1. Deep sacral decubitus ulcer extending down to the bone with osteomyelitis involving the distal sacrum/coccyx. 2. No drainable soft tissue abscess. 3. No findings suspicious for septic arthritis involving the hips or SI joints. 4. Chronic appearing tendinopathy involving the gluteal tendons and moderate peritendinosis and mild trochanteric bursitis. 5. Fibroid uterus.   All pertinent labs/Imagings/notes reviewed. All pertinent plain films and CT images have been personally visualized and interpreted; radiology reports have been reviewed. Decision making incorporated into the Impression / Recommendations.  I spent greater than 25  minutes with the patient including  review of prior medical records with greater than 50% of time in face to face counsel of the patient.      Raymondo Band, MD [facility] [phone]  pager   [phone] cell 09/09/2020, 2:34 PM

## 2020-10-12 NOTE — Patient Instructions (Signed)
No clinical evidence infection  Mri showed prior evidence of disease, not active infection  Continue aggressive wound care, off loading, physical therapy   Consider getting neurologic evaluation for weakness

## 2020-11-17 ENCOUNTER — Emergency Department (HOSPITAL_COMMUNITY): Payer: Medicare Other

## 2020-11-17 ENCOUNTER — Inpatient Hospital Stay (HOSPITAL_COMMUNITY)
Admission: EM | Admit: 2020-11-17 | Discharge: 2020-12-02 | DRG: 871 | Disposition: A | Discharge status: Skilled Nursing Facility | Payer: Medicare Other | Source: Skilled Nursing Facility | Attending: Internal Medicine | Admitting: Internal Medicine

## 2020-11-17 ENCOUNTER — Encounter (HOSPITAL_COMMUNITY): Payer: Self-pay | Admitting: Internal Medicine

## 2020-11-17 ENCOUNTER — Other Ambulatory Visit: Payer: Self-pay

## 2020-11-17 DIAGNOSIS — M4628 Osteomyelitis of vertebra, sacral and sacrococcygeal region: Secondary | ICD-10-CM | POA: Diagnosis present

## 2020-11-17 DIAGNOSIS — E782 Mixed hyperlipidemia: Secondary | ICD-10-CM | POA: Diagnosis present

## 2020-11-17 DIAGNOSIS — R112 Nausea with vomiting, unspecified: Secondary | ICD-10-CM | POA: Diagnosis present

## 2020-11-17 DIAGNOSIS — S31000A Unspecified open wound of lower back and pelvis without penetration into retroperitoneum, initial encounter: Secondary | ICD-10-CM | POA: Diagnosis present

## 2020-11-17 DIAGNOSIS — R6510 Systemic inflammatory response syndrome (SIRS) of non-infectious origin without acute organ dysfunction: Secondary | ICD-10-CM

## 2020-11-17 DIAGNOSIS — Z79899 Other long term (current) drug therapy: Secondary | ICD-10-CM

## 2020-11-17 DIAGNOSIS — G928 Other toxic encephalopathy: Secondary | ICD-10-CM | POA: Diagnosis present

## 2020-11-17 DIAGNOSIS — G9340 Encephalopathy, unspecified: Secondary | ICD-10-CM

## 2020-11-17 DIAGNOSIS — F419 Anxiety disorder, unspecified: Secondary | ICD-10-CM | POA: Diagnosis present

## 2020-11-17 DIAGNOSIS — Z888 Allergy status to other drugs, medicaments and biological substances status: Secondary | ICD-10-CM

## 2020-11-17 DIAGNOSIS — G9341 Metabolic encephalopathy: Secondary | ICD-10-CM | POA: Diagnosis present

## 2020-11-17 DIAGNOSIS — G40909 Epilepsy, unspecified, not intractable, without status epilepticus: Secondary | ICD-10-CM | POA: Diagnosis present

## 2020-11-17 DIAGNOSIS — A4102 Sepsis due to Methicillin resistant Staphylococcus aureus: Principal | ICD-10-CM | POA: Diagnosis present

## 2020-11-17 DIAGNOSIS — Z20822 Contact with and (suspected) exposure to covid-19: Secondary | ICD-10-CM | POA: Diagnosis present

## 2020-11-17 DIAGNOSIS — S31000D Unspecified open wound of lower back and pelvis without penetration into retroperitoneum, subsequent encounter: Secondary | ICD-10-CM

## 2020-11-17 DIAGNOSIS — Z8616 Personal history of COVID-19: Secondary | ICD-10-CM

## 2020-11-17 DIAGNOSIS — Z7901 Long term (current) use of anticoagulants: Secondary | ICD-10-CM

## 2020-11-17 DIAGNOSIS — K5900 Constipation, unspecified: Secondary | ICD-10-CM | POA: Diagnosis present

## 2020-11-17 DIAGNOSIS — R1110 Vomiting, unspecified: Secondary | ICD-10-CM | POA: Diagnosis present

## 2020-11-17 DIAGNOSIS — R6520 Severe sepsis without septic shock: Secondary | ICD-10-CM | POA: Diagnosis present

## 2020-11-17 DIAGNOSIS — K529 Noninfective gastroenteritis and colitis, unspecified: Secondary | ICD-10-CM | POA: Diagnosis present

## 2020-11-17 DIAGNOSIS — F3111 Bipolar disorder, current episode manic without psychotic features, mild: Secondary | ICD-10-CM | POA: Diagnosis present

## 2020-11-17 DIAGNOSIS — R339 Retention of urine, unspecified: Secondary | ICD-10-CM | POA: Diagnosis present

## 2020-11-17 DIAGNOSIS — R7881 Bacteremia: Secondary | ICD-10-CM

## 2020-11-17 DIAGNOSIS — E785 Hyperlipidemia, unspecified: Secondary | ICD-10-CM | POA: Diagnosis present

## 2020-11-17 DIAGNOSIS — E1169 Type 2 diabetes mellitus with other specified complication: Secondary | ICD-10-CM | POA: Diagnosis present

## 2020-11-17 DIAGNOSIS — R0602 Shortness of breath: Secondary | ICD-10-CM

## 2020-11-17 DIAGNOSIS — E1165 Type 2 diabetes mellitus with hyperglycemia: Secondary | ICD-10-CM | POA: Diagnosis present

## 2020-11-17 DIAGNOSIS — Z8674 Personal history of sudden cardiac arrest: Secondary | ICD-10-CM

## 2020-11-17 DIAGNOSIS — E119 Type 2 diabetes mellitus without complications: Secondary | ICD-10-CM

## 2020-11-17 DIAGNOSIS — Z881 Allergy status to other antibiotic agents status: Secondary | ICD-10-CM

## 2020-11-17 DIAGNOSIS — B9562 Methicillin resistant Staphylococcus aureus infection as the cause of diseases classified elsewhere: Secondary | ICD-10-CM

## 2020-11-17 DIAGNOSIS — I480 Paroxysmal atrial fibrillation: Secondary | ICD-10-CM | POA: Diagnosis present

## 2020-11-17 DIAGNOSIS — I5032 Chronic diastolic (congestive) heart failure: Secondary | ICD-10-CM | POA: Diagnosis present

## 2020-11-17 DIAGNOSIS — E871 Hypo-osmolality and hyponatremia: Secondary | ICD-10-CM | POA: Diagnosis present

## 2020-11-17 DIAGNOSIS — L98499 Non-pressure chronic ulcer of skin of other sites with unspecified severity: Secondary | ICD-10-CM | POA: Diagnosis present

## 2020-11-17 DIAGNOSIS — F312 Bipolar disorder, current episode manic severe with psychotic features: Secondary | ICD-10-CM | POA: Diagnosis present

## 2020-11-17 DIAGNOSIS — E669 Obesity, unspecified: Secondary | ICD-10-CM | POA: Diagnosis present

## 2020-11-17 DIAGNOSIS — I110 Hypertensive heart disease with heart failure: Secondary | ICD-10-CM | POA: Diagnosis present

## 2020-11-17 DIAGNOSIS — R21 Rash and other nonspecific skin eruption: Secondary | ICD-10-CM | POA: Diagnosis present

## 2020-11-17 DIAGNOSIS — R110 Nausea: Secondary | ICD-10-CM

## 2020-11-17 DIAGNOSIS — F319 Bipolar disorder, unspecified: Secondary | ICD-10-CM | POA: Diagnosis present

## 2020-11-17 DIAGNOSIS — Z794 Long term (current) use of insulin: Secondary | ICD-10-CM

## 2020-11-17 DIAGNOSIS — I6783 Posterior reversible encephalopathy syndrome: Secondary | ICD-10-CM | POA: Diagnosis not present

## 2020-11-17 DIAGNOSIS — A419 Sepsis, unspecified organism: Secondary | ICD-10-CM | POA: Diagnosis present

## 2020-11-17 DIAGNOSIS — Z6831 Body mass index (BMI) 31.0-31.9, adult: Secondary | ICD-10-CM

## 2020-11-17 DIAGNOSIS — Z9114 Patient's other noncompliance with medication regimen: Secondary | ICD-10-CM

## 2020-11-17 HISTORY — DX: Systemic inflammatory response syndrome (sirs) of non-infectious origin without acute organ dysfunction: R65.10

## 2020-11-17 HISTORY — DX: Nausea with vomiting, unspecified: R11.2

## 2020-11-17 LAB — CBC
HCT: 42 % (ref 36.0–46.0)
Hemoglobin: 13.4 g/dL (ref 12.0–15.0)
MCH: 28.6 pg (ref 26.0–34.0)
MCHC: 31.9 g/dL (ref 30.0–36.0)
MCV: 89.6 fL (ref 80.0–100.0)
Platelets: 248 10*3/uL (ref 150–400)
RBC: 4.69 MIL/uL (ref 3.87–5.11)
RDW: 14.2 % (ref 11.5–15.5)
WBC: 12 10*3/uL — ABNORMAL HIGH (ref 4.0–10.5)
nRBC: 0 % (ref 0.0–0.2)

## 2020-11-17 LAB — URINALYSIS, ROUTINE W REFLEX MICROSCOPIC
Bilirubin Urine: NEGATIVE
Glucose, UA: 50 mg/dL — AB
Hgb urine dipstick: NEGATIVE
Ketones, ur: 20 mg/dL — AB
Leukocytes,Ua: NEGATIVE
Nitrite: NEGATIVE
Protein, ur: NEGATIVE mg/dL
Specific Gravity, Urine: 1.013 (ref 1.005–1.030)
pH: 8 (ref 5.0–8.0)

## 2020-11-17 LAB — COMPREHENSIVE METABOLIC PANEL
ALT: 15 U/L (ref 0–44)
AST: 13 U/L — ABNORMAL LOW (ref 15–41)
Albumin: 3.7 g/dL (ref 3.5–5.0)
Alkaline Phosphatase: 54 U/L (ref 38–126)
Anion gap: 12 (ref 5–15)
BUN: 17 mg/dL (ref 8–23)
CO2: 26 mmol/L (ref 22–32)
Calcium: 9.4 mg/dL (ref 8.9–10.3)
Chloride: 97 mmol/L — ABNORMAL LOW (ref 98–111)
Creatinine, Ser: 0.51 mg/dL (ref 0.44–1.00)
GFR, Estimated: >60 mL/min (ref >60)
Glucose, Bld: 152 mg/dL — ABNORMAL HIGH (ref 70–99)
Potassium: 3.9 mmol/L (ref 3.5–5.1)
Sodium: 135 mmol/L (ref 135–145)
Total Bilirubin: 0.9 mg/dL (ref 0.3–1.2)
Total Protein: 7.6 g/dL (ref 6.5–8.1)

## 2020-11-17 LAB — VALPROIC ACID LEVEL: Valproic Acid Lvl: <10 ug/mL — ABNORMAL LOW (ref 50.0–100.0)

## 2020-11-17 LAB — RESP PANEL BY RT-PCR (FLU A&B, COVID) ARPGX2
Influenza A by PCR: NEGATIVE
Influenza B by PCR: NEGATIVE
SARS Coronavirus 2 by RT PCR: NEGATIVE

## 2020-11-17 LAB — LACTIC ACID, PLASMA: Lactic Acid, Venous: 1.4 mmol/L (ref 0.5–1.9)

## 2020-11-17 IMAGING — CT CT HEAD W/O CM
3 of 4 series · 15 of 47 positions shown, 18 images · non-contrast
Comparison: [DATE]

CLINICAL DATA: Altered mental status.

EXAM:
CT HEAD WITHOUT CONTRAST
TECHNIQUE: Contiguous axial images were obtained from the base of the skull
through the vertex without intravenous contrast.

[Series 4: coronal soft tissue · coronal · 0.36mm/px · 3 of 68 slices shown]
[im 23/68  brain]
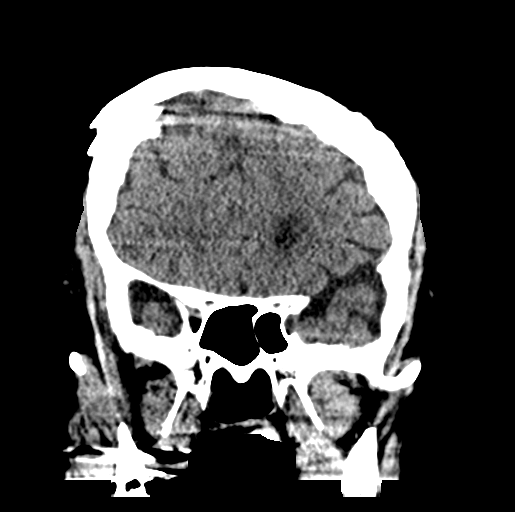
[im 30/68  brain]
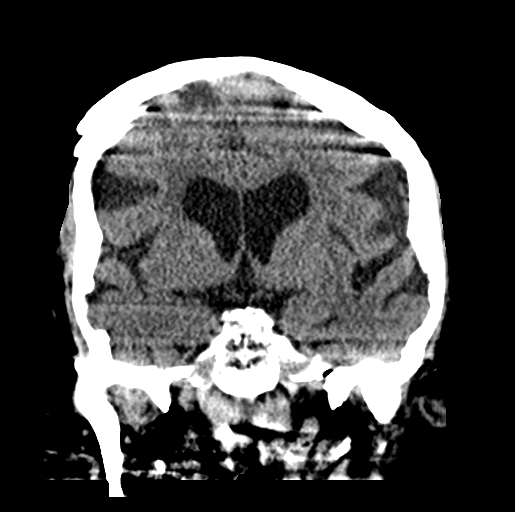
[im 38/68  brain]
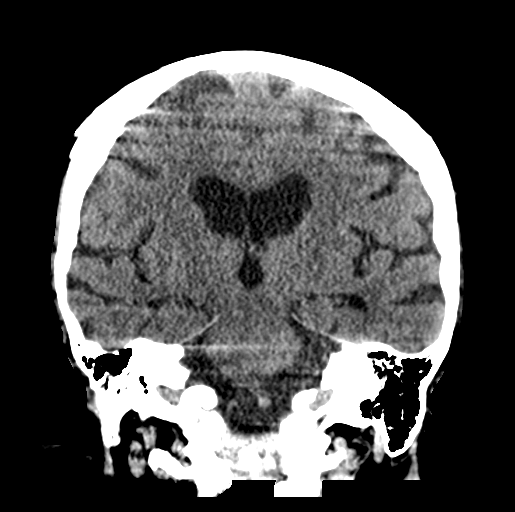

[Series 5: sagittal soft tissue · sagittal · 0.34mm/px · 3 of 63 slices shown]
[im 21/63  brain]
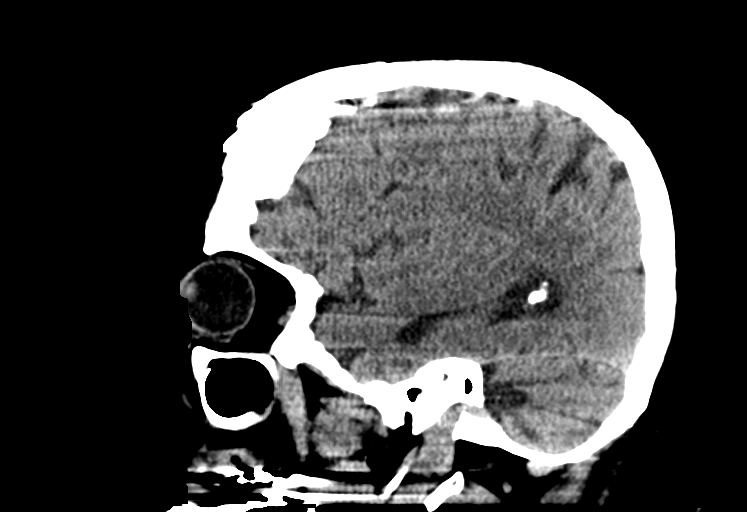
[im 32/63  brain]
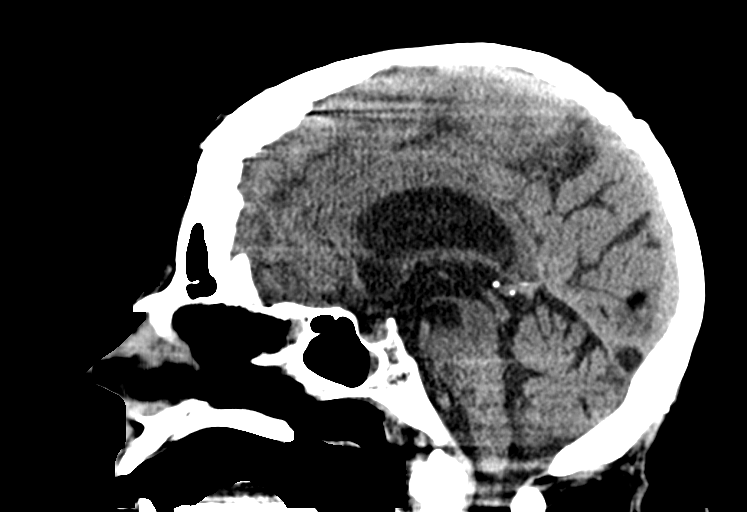
[im 42/63  brain]
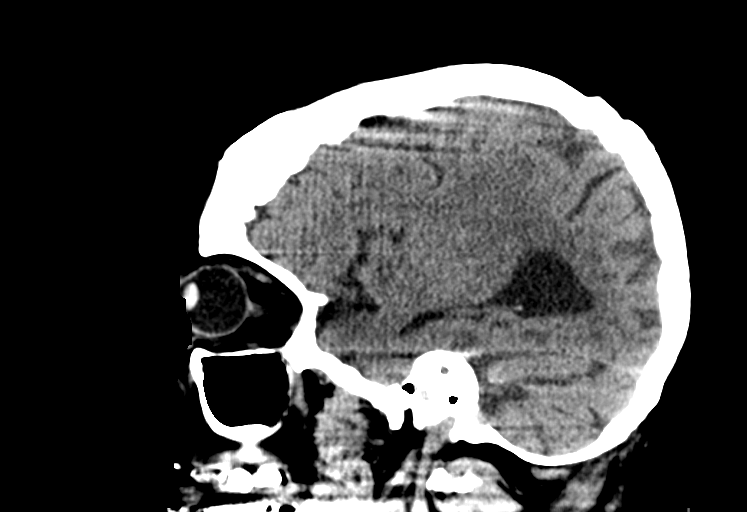

[Series 7: axial soft tissue · axial · 0.36mm/px · z∈[-666,-513]mm · 9 of 59 slices shown, 12 images]
[im 4/59  brain]
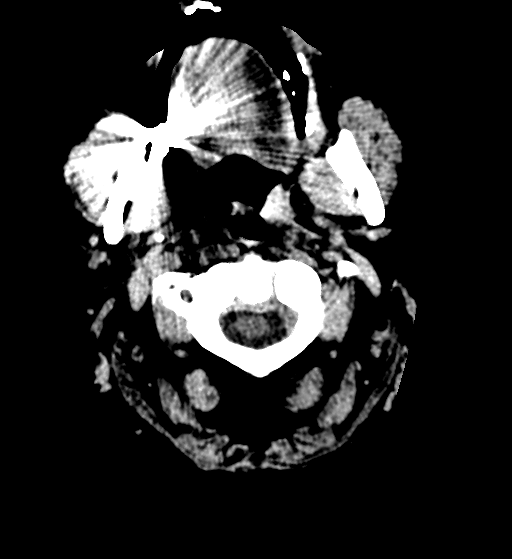
[im 4/59  bone]
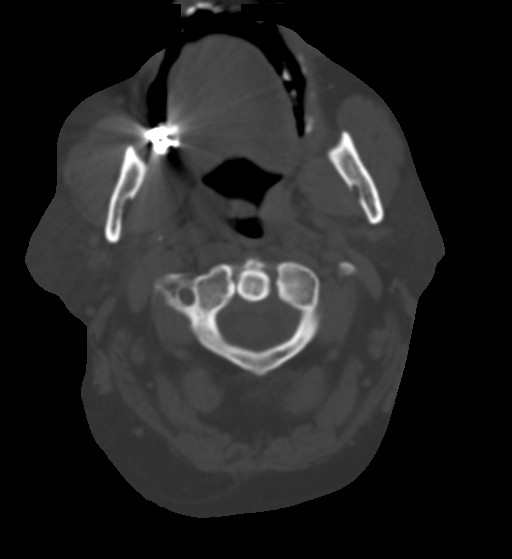
[im 11/59  brain]
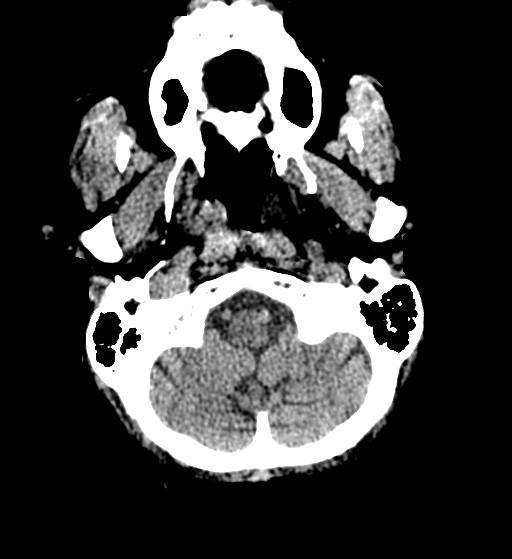
[im 18/59  brain]
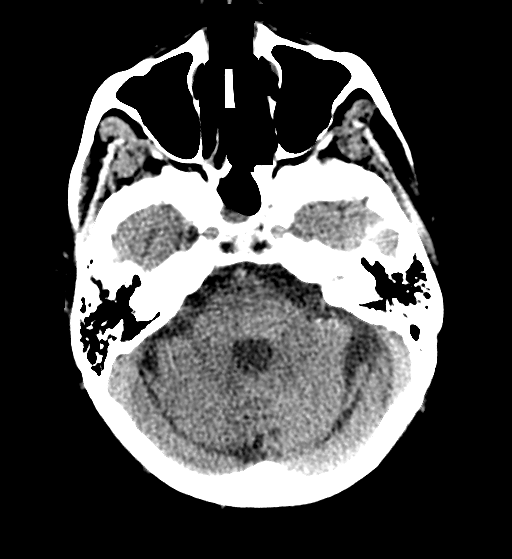
[im 24/59  brain]
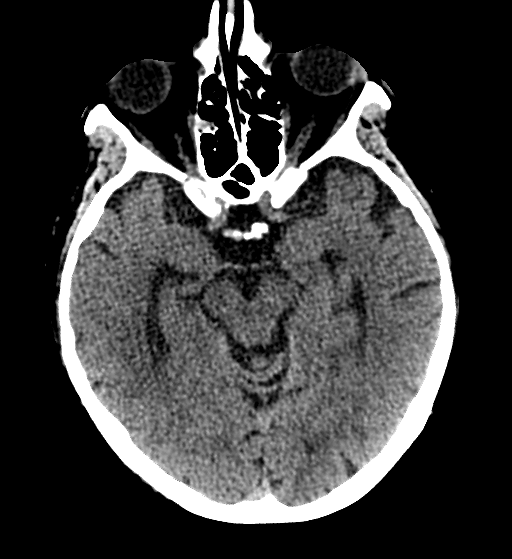
[im 31/59  brain]
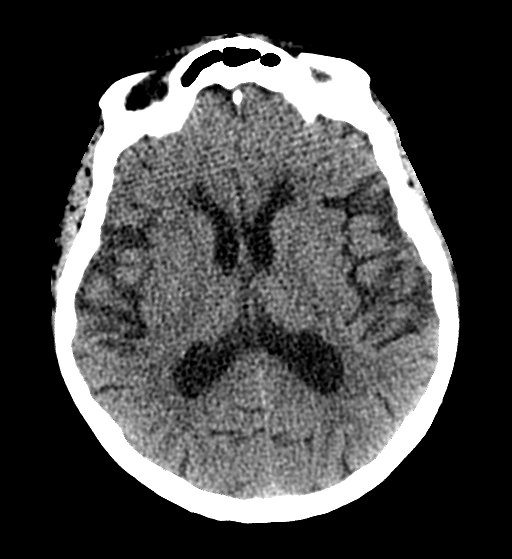
[im 31/59  bone]
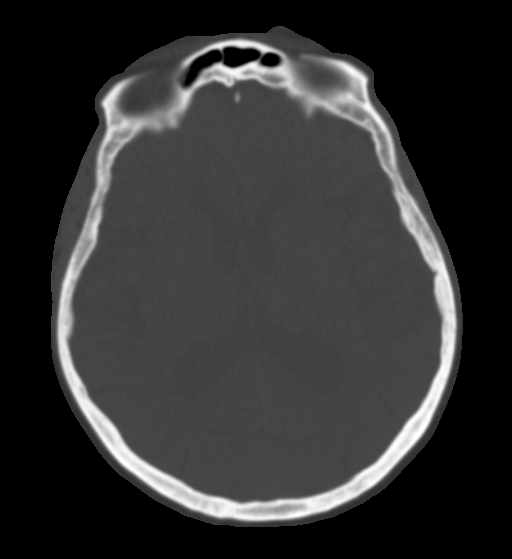
[im 35/59  brain]
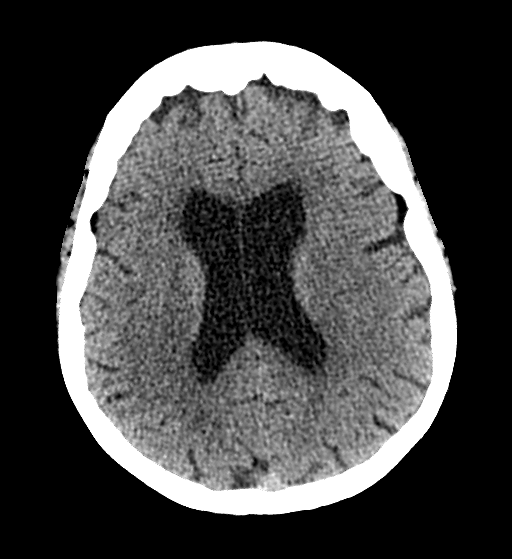
[im 41/59  brain]
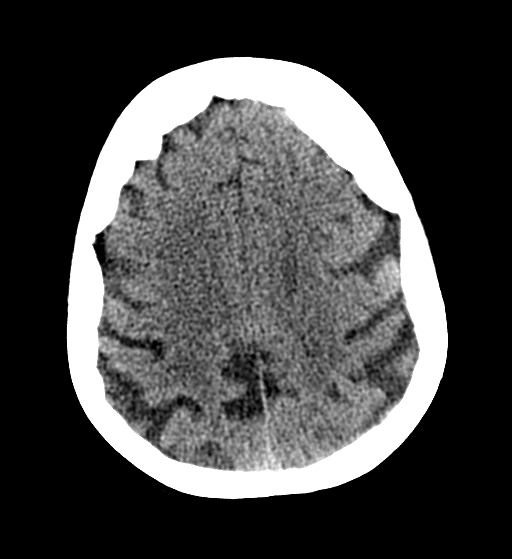
[im 48/59  brain]
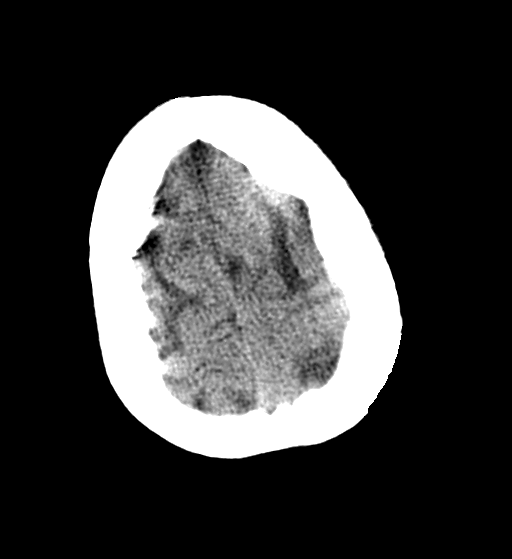
[im 55/59  brain]
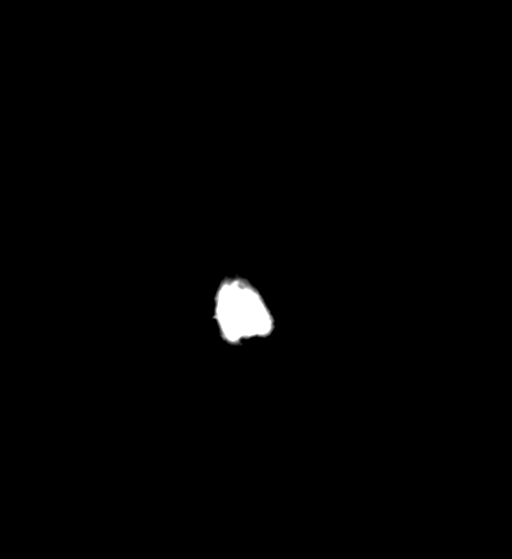
[im 55/59  bone]
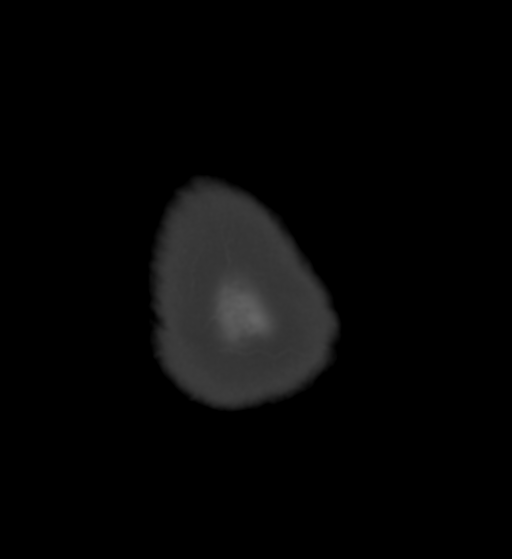

[15 of 47 positions shown; findings below may reference images not displayed]

FINDINGS: Brain: There is mild cerebral atrophy with widening of the
extra-axial spaces and ventricular dilatation.
There are areas of decreased attenuation within the white matter
tracts of the supratentorial brain, consistent with microvascular
disease changes.

Vascular: No hyperdense vessel or unexpected calcification.

Skull: Normal. Negative for fracture or focal lesion.

Sinuses/Orbits: Mild bilateral ethmoid sinus mucosal thickening is
seen.

Other: The study is limited secondary to patient motion.
IMPRESSION: 1. Generalized cerebral atrophy.
2. No acute intracranial abnormality.

## 2020-11-17 IMAGING — CT CT CHEST W/ CM
2 of 5 series · 13 of 36 positions shown, 16 images · IV contrast (omnipaque)
Comparison: CT abdomen pelvis [DATE], CT angiography chest
[DATE]

CLINICAL DATA: Fever, altered mental status.  Nausea and vomiting.

EXAM:
CT CHEST, ABDOMEN, AND PELVIS WITH CONTRAST
TECHNIQUE: Multidetector CT imaging of the chest, abdomen and pelvis was
performed following the standard protocol during bolus
administration of intravenous contrast.
CONTRAST:  100mL OMNIPAQUE IOHEXOL 300 MG/ML  SOLN

[Series 2: cap with · axial · 0.98mm/px · z∈[+864,+1449]mm · 10 of 145 slices shown, 13 images]
[im 14/145  mediastinal]
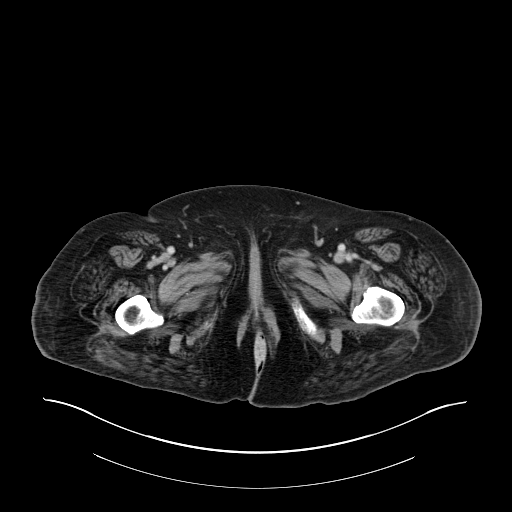
[im 14/145  lung]
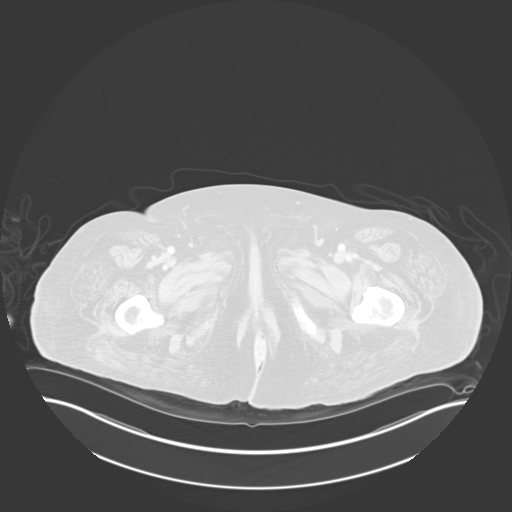
[im 27/145  lung]
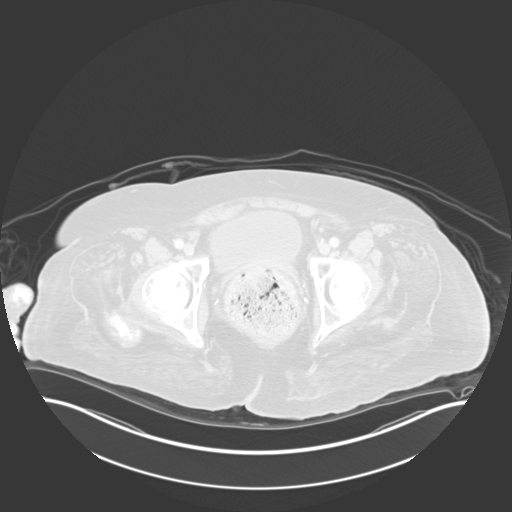
[im 40/145  lung]
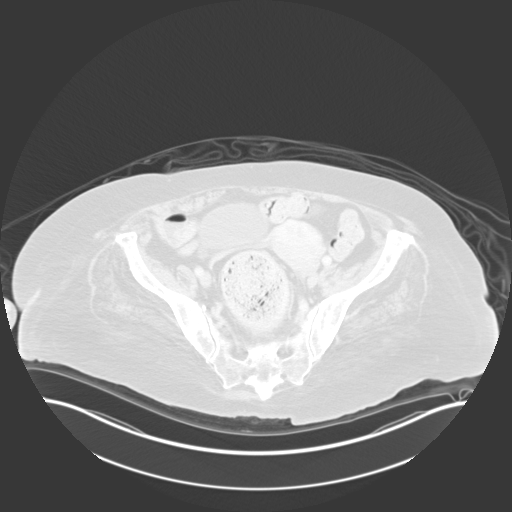
[im 53/145  lung]
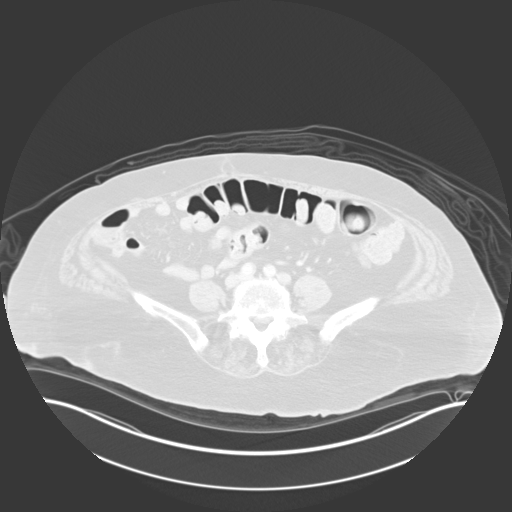
[im 66/145  mediastinal]
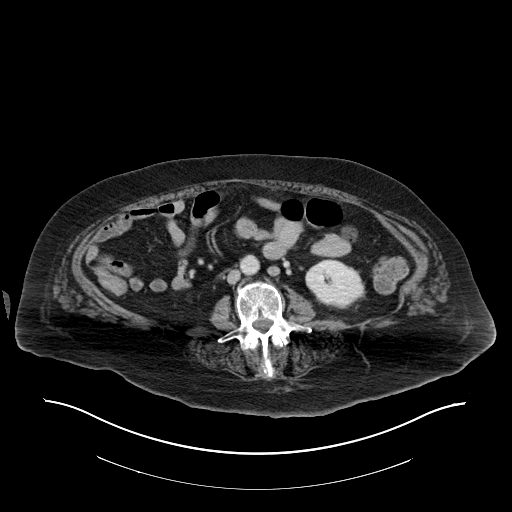
[im 66/145  lung]
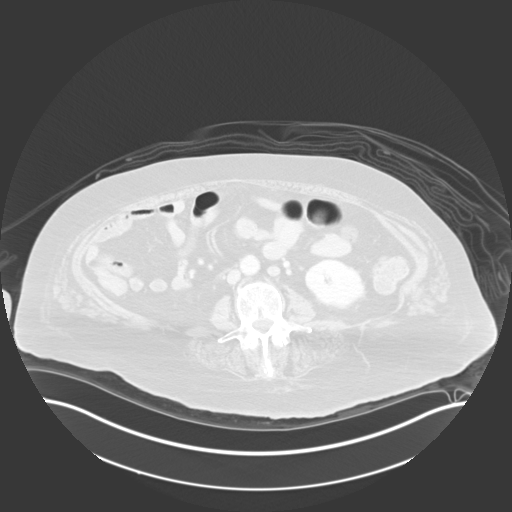
[im 79/145  lung]
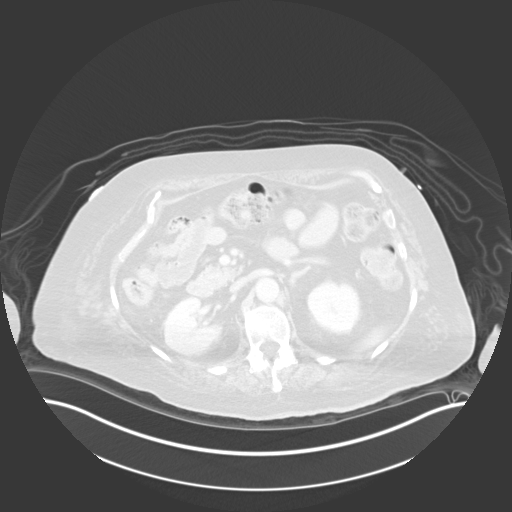
[im 92/145  lung]
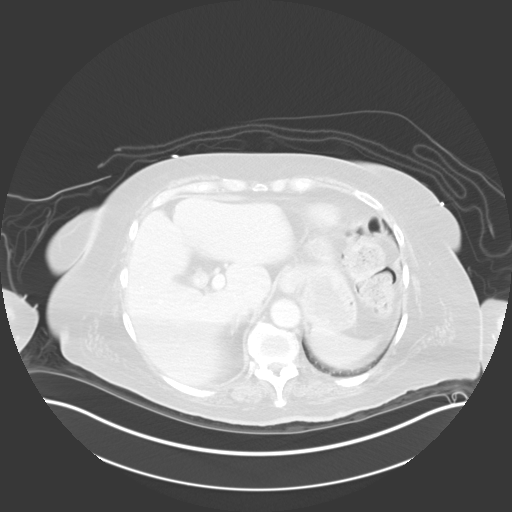
[im 105/145  lung]
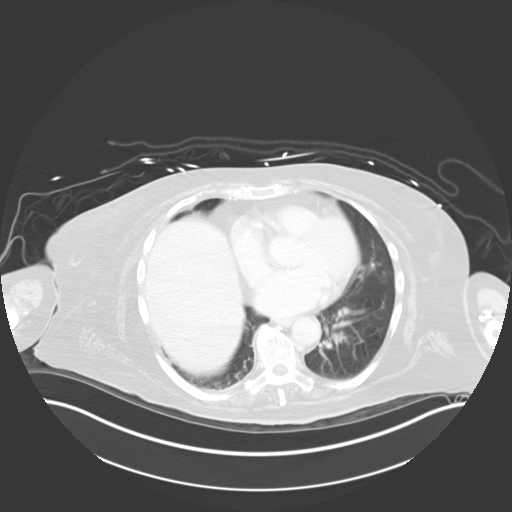
[im 118/145  mediastinal]
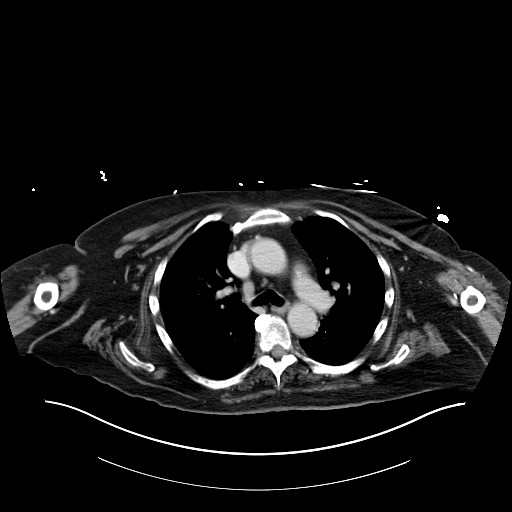
[im 118/145  lung]
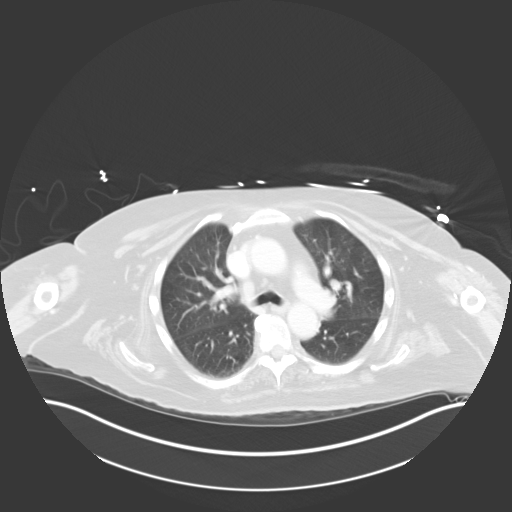
[im 131/145  lung]
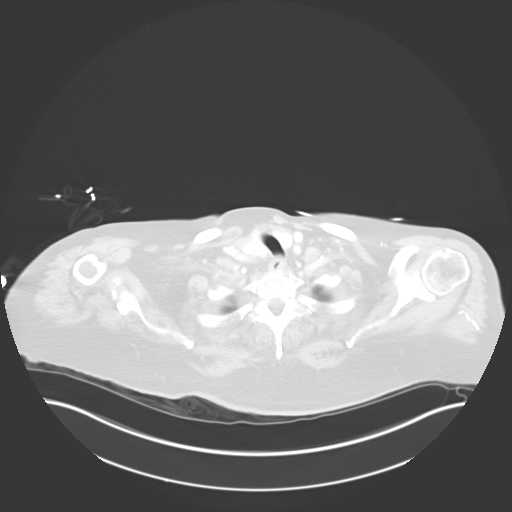

[Series 5: coronals · coronal · 1.10mm/px · 3 of 185 slices shown]
[im 37/185  lung]
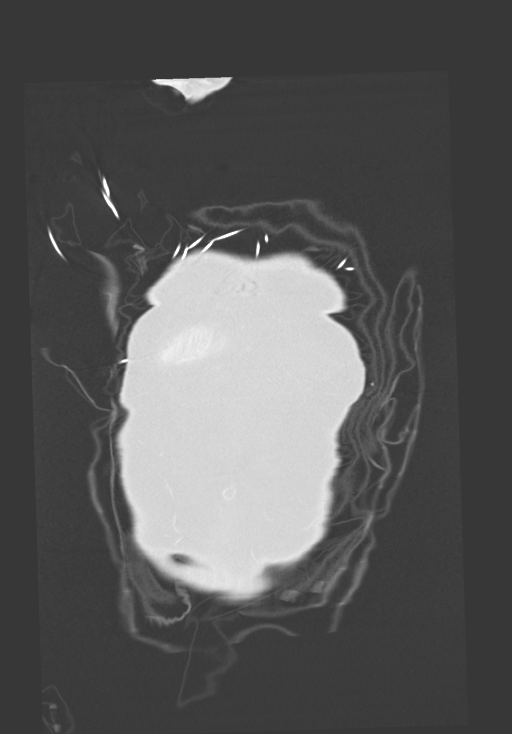
[im 74/185  lung]
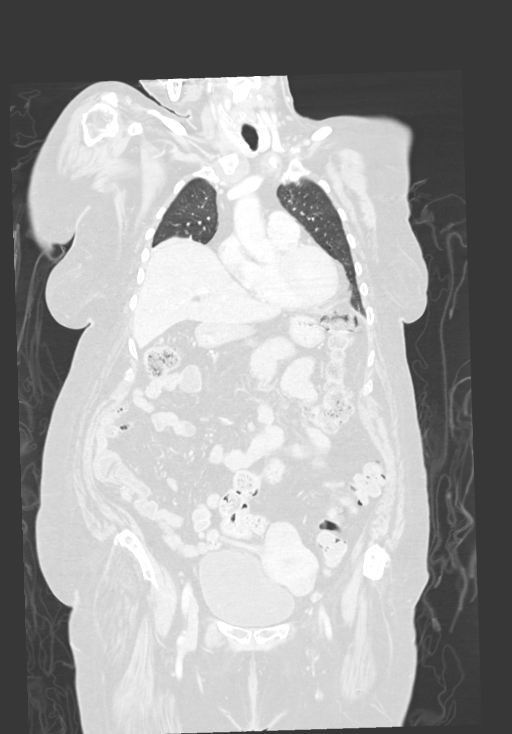
[im 111/185  lung]
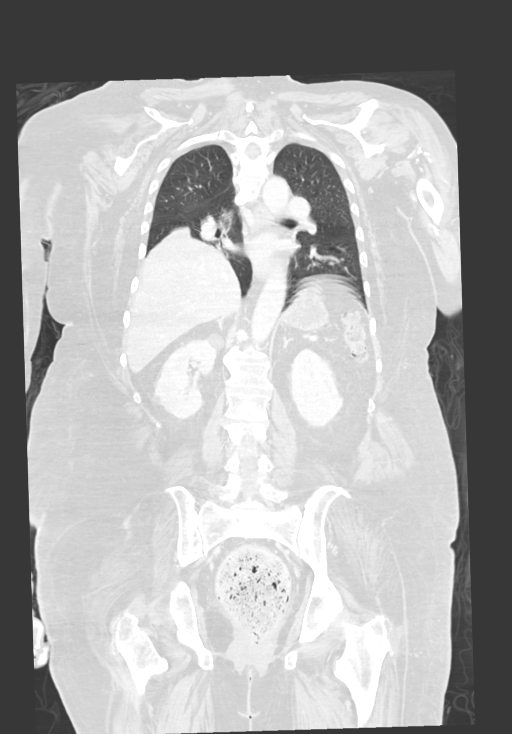

[13 of 36 positions shown; findings below may reference images not displayed]

FINDINGS: CT CHEST FINDINGS

Cardiovascular: Normal heart size. No significant pericardial
effusion. The thoracic aorta is normal in caliber. Mild
atherosclerotic plaque of the thoracic aorta. The main pulmonary
artery measures at the upper limits of normal: 3.2 cm.

Mediastinum/Nodes: No enlarged mediastinal, hilar, or axillary lymph
nodes. Thyroid gland, trachea, and esophagus demonstrate no
significant findings.

Lungs/Pleura: Right lower lobe atelectasis with persistent elevated
right hemidiaphragm. Limited evaluation for pulmonary nodule due to
respiratory motion artifact. No pulmonary mass. No focal
consolidation. No pleural effusion. No pneumothorax.

Musculoskeletal:

No chest wall abnormality.

No suspicious lytic or blastic osseous lesions. No acute displaced
fracture. Multilevel degenerative changes of the spine.

CT ABDOMEN PELVIS FINDINGS

Hepatobiliary: No focal liver abnormality. Gallstone noted within
the gallbladder lumen. No gallbladder wall thickening or
pericholecystic fluid. No biliary dilatation.

Pancreas: No focal lesion. Normal pancreatic contour. No surrounding
inflammatory changes. No main pancreatic ductal dilatation.

Spleen: Normal in size without focal abnormality.

Adrenals/Urinary Tract:

No adrenal nodule bilaterally.

Bilateral kidneys enhance symmetrically. Exophytic hypodense 2.2 cm
lesion within the right kidney likely represent simple renal cysts.
Slightly increased density likely due to streak artifact at this
level. No hydronephrosis. No hydroureter.

The urinary bladder is unremarkable.

On delayed imaging, there is no urothelial wall thickening and there
are no filling defects in the opacified portions of the bilateral
collecting systems or ureters.

Stomach/Bowel: Stomach is within normal limits. No evidence of bowel
wall thickening or dilatation. 8 cm stool ball within the rectum.
The appendix not definitely identified.

Vascular/Lymphatic: No abdominal aorta or iliac aneurysm. Mild
atherosclerotic plaque of the aorta and its branches. No abdominal,
pelvic, or inguinal lymphadenopathy.

Reproductive: Difficult to measure grossly stable uterine fibroids.
No adnexal lesion.

Other: No intraperitoneal free fluid. No intraperitoneal free gas.
No organized fluid collection.

Musculoskeletal:

Tiny fat containing left inguinal hernia.

No suspicious lytic or blastic osseous lesions. No acute displaced
fracture. Multilevel degenerative changes of the spine.
IMPRESSION: 1. Main pulmonary artery measures at the upper limits of normal.
Correlate with pulmonary hypertension.
2. Otherwise no acute intrathoracic abnormality.
3. Cholelithiasis with no CT findings of acute cholecystitis.
4. An 8 cm stool ball within the rectum with no associated findings
suggest of stercoral colitis.
5. Grossly stable in size uterine fibroids.

## 2020-11-17 MED ORDER — ONDANSETRON HCL 4 MG/2ML IJ SOLN
4.0000 mg | Freq: Once | INTRAMUSCULAR | Status: DC
  Filled 2020-11-17: qty 2

## 2020-11-17 MED ORDER — SODIUM CHLORIDE 0.9 % IV SOLN
2.0000 g | Freq: Two times a day (BID) | INTRAVENOUS | Status: DC
  Administered 2020-11-17 – 2020-11-18 (×2): 2 g via INTRAVENOUS
  Filled 2020-11-17 (×2): qty 20

## 2020-11-17 MED ORDER — IOHEXOL 300 MG/ML  SOLN
100.0000 mL | Freq: Once | INTRAMUSCULAR | Status: AC | PRN
  Administered 2020-11-17: 100 mL via INTRAVENOUS

## 2020-11-17 MED ORDER — ACETAMINOPHEN 650 MG RE SUPP
650.0000 mg | Freq: Once | RECTAL | Status: AC
  Administered 2020-11-17: 650 mg via RECTAL
  Filled 2020-11-17: qty 1

## 2020-11-17 MED ORDER — KETOROLAC TROMETHAMINE 30 MG/ML IJ SOLN
30.0000 mg | Freq: Once | INTRAMUSCULAR | Status: AC
  Administered 2020-11-17: 30 mg via INTRAVENOUS
  Filled 2020-11-17: qty 1

## 2020-11-17 MED ORDER — VANCOMYCIN HCL IN DEXTROSE 1-5 GM/200ML-% IV SOLN
1000.0000 mg | Freq: Once | INTRAVENOUS | Status: AC
  Administered 2020-11-18: 1000 mg via INTRAVENOUS
  Filled 2020-11-17: qty 200

## 2020-11-17 MED ORDER — ONDANSETRON HCL 4 MG/2ML IJ SOLN
4.0000 mg | INTRAMUSCULAR | Status: AC
  Administered 2020-11-17: 4 mg via INTRAMUSCULAR

## 2020-11-17 MED ORDER — SODIUM CHLORIDE 0.9 % IV SOLN
2.0000 g | INTRAVENOUS | Status: DC
  Administered 2020-11-18 (×4): 2 g via INTRAVENOUS
  Filled 2020-11-17 (×7): qty 2000

## 2020-11-17 MED ORDER — VANCOMYCIN HCL 1250 MG/250ML IV SOLN
1250.0000 mg | INTRAVENOUS | Status: DC
  Administered 2020-11-18 – 2020-11-19 (×2): 1250 mg via INTRAVENOUS
  Filled 2020-11-17 (×4): qty 250

## 2020-11-17 MED ORDER — SODIUM CHLORIDE 0.9 % IV SOLN: INTRAVENOUS | Status: DC

## 2020-11-17 NOTE — ED Notes (Signed)
Patient had emesis episode x1 in CT scan

## 2020-11-17 NOTE — ED Notes (Signed)
Patient transported to CT 

## 2020-11-17 NOTE — ED Notes (Signed)
ED TO INPATIENT HANDOFF REPORT  Name/Age/Gender Becky Hendricks 75 y.o. female  Code Status Code Status History    Date Active Date Inactive Code Status Order ID Comments User Context   05/05/2020 1642 05/25/2020 2220 Full Code [id number]  Pricilla Loveless, MD ED   01/19/2020 1641 01/27/2020 1907 Full Code [id number]  Lupita Leash, MD Inpatient   12/20/2019 1801 01/06/2020 2306 Full Code [id number]  Simonne Martinet, NP ED   12/03/2019 2122 12/18/2019 0214 Full Code [id number]  John Giovanni, MD ED   09/07/2019 1826 09/23/2019 0716 Full Code [id number]  Briant Sites, DO Inpatient   Advance Care Planning Activity    Questions for Most Recent Historical Code Status (Order [id number])       Home/SNF/Other Nursing Home  Chief Complaint SIRS (systemic inflammatory response syndrome) (HCC) [R65.10]  Level of Care/Admitting Diagnosis ED Disposition    ED Disposition Condition Comment   Admit  Hospital Area: [facility] [[id number]]  Level of Care: Telemetry [5]  Admit to tele based on following criteria: Monitor for Ischemic changes  Covid Evaluation: Confirmed COVID Negative  Diagnosis: SIRS (systemic inflammatory response syndrome) [facility]) [[id number]]  Admitting Physician: Marinda Elk [[id number]]  Attending Physician: Marinda Elk [[id number]]       Medical History Past Medical History:  Diagnosis Date  . Atrial fibrillation (HCC)   . Bipolar disorder (HCC)   . Bipolar disorder (HCC) 04/27/2020  . Clostridium difficile diarrhea 08/02/2019  . Dehydration   . Essential hypertension   . Fall 02/11/2020  . Morbid obesity (HCC)   . Type 2 diabetes mellitus (HCC)   . Weakness     Allergies Allergies  Allergen Reactions  . Chlorhexidine Gluconate Itching  . 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 05/09/20  --  -- 192   Wound / Incision (Open or Dehisced) 01/19/20 Non-pressure wound Buttocks Left 2 open surgical wounds 01/19/20  --  Buttocks  303          Labs/Imaging Results for orders placed or performed during the hospital encounter of 11/17/20 (from the past 48 hour(s))  CBC     Status: Abnormal   Collection Time: 11/17/20  4:00 PM  Result Value Ref Range   WBC 12.0 (H) 4.0 - 10.5 K/uL   RBC 4.69 3.87 - 5.11 MIL/uL   Hemoglobin 13.4 12.0 - 15.0 g/dL   HCT 57.3 22.0 - 25.4 %   MCV 89.6 80.0 - 100.0 fL   MCH 28.6 26.0 - 34.0 pg   MCHC 31.9 30.0 - 36.0 g/dL   RDW 27.0 62.3 - 76.2 %   Platelets 248 150 - 400 K/uL   nRBC 0.0 0.0 - 0.2 %    Comment: Performed at [facility], [address], [place], [place] [address]  Comprehensive metabolic panel     Status: Abnormal  Collection Time: 11/17/20  4:00 PM  Result Value Ref Range   Sodium 135 135 - 145 mmol/L   Potassium 3.9 3.5 - 5.1 mmol/L   Chloride 97 (L) 98 - 111 mmol/L   CO2 26 22 - 32 mmol/L   Glucose, Bld 152 (H) 70 - 99 mg/dL    Comment: Glucose reference range applies only to samples taken after fasting for at least 8 hours.   BUN 17 8 - 23 mg/dL   Creatinine, Ser 6.46 0.44 - 1.00 mg/dL   Calcium 9.4 8.9 - 80.3 mg/dL   Total Protein 7.6 6.5 - 8.1 g/dL   Albumin 3.7 3.5 - 5.0 g/dL   AST 13 (L) 15 - 41 U/L   ALT 15 0 - 44 U/L   Alkaline Phosphatase 54 38 - 126 U/L   Total Bilirubin 0.9 0.3 - 1.2 mg/dL   GFR, Estimated >21 >22 mL/min    Comment: (NOTE) Calculated using the CKD-EPI  Creatinine Equation (2021)    Anion gap 12 5 - 15    Comment: Performed at [facility], [address] [place], [place] [address]  Valproic acid level     Status: Abnormal   Collection Time: 11/17/20  4:00 PM  Result Value Ref Range   Valproic Acid Lvl <10 (L) 50.0 - 100.0 ug/mL    Comment: RESULTS CONFIRMED BY MANUAL DILUTION Performed at [facility], [address] [place], [place] [address]   Urinalysis, Routine w reflex microscopic Urine, Catheterized     Status: Abnormal   Collection Time: 11/17/20  5:14 PM  Result Value Ref Range   Color, Urine STRAW (A) YELLOW   APPearance CLEAR CLEAR   Specific Gravity, Urine 1.013 1.005 - 1.030   pH 8.0 5.0 - 8.0   Glucose, UA 50 (A) NEGATIVE mg/dL   Hgb urine dipstick NEGATIVE NEGATIVE   Bilirubin Urine NEGATIVE NEGATIVE   Ketones, ur 20 (A) NEGATIVE mg/dL   Protein, ur NEGATIVE NEGATIVE mg/dL   Nitrite NEGATIVE NEGATIVE   Leukocytes,Ua NEGATIVE NEGATIVE    Comment: Performed at [facility], [address], [place], [place] [address]  Lactic acid, plasma     Status: None   Collection Time: 11/17/20  6:42 PM  Result Value Ref Range   Lactic Acid, Venous 1.4 0.5 - 1.9 mmol/L    Comment: Performed at [facility], [address] [place], [place] [address]  Resp Panel by RT-PCR (Flu A&B, Covid) Nasopharyngeal Swab     Status: None   Collection Time: 11/17/20  7:12 PM   Specimen: Nasopharyngeal Swab; Nasopharyngeal(NP) swabs in vial transport medium  Result Value Ref Range   SARS Coronavirus 2 by RT PCR NEGATIVE NEGATIVE    Comment: (NOTE) SARS-CoV-2 target nucleic acids are NOT DETECTED.  The SARS-CoV-2 RNA is generally detectable in upper respiratory specimens during the acute phase of infection. The lowest concentration of SARS-CoV-2 viral copies this assay can detect is 138 copies/mL. A negative result does not preclude SARS-Cov-2 infection and should  not be used as the sole basis for treatment or other patient management decisions. A negative result may occur with  improper specimen collection/handling, submission of specimen other than nasopharyngeal swab, presence of viral mutation(s) within the areas targeted by this assay, and inadequate number of viral copies(<138 copies/mL). A negative result must be combined with clinical observations, patient history, and epidemiological information. The expected result is Negative.  Fact Sheet for Patients:  BloggerCourse.com  Fact Sheet for Healthcare Providers:  SeriousBroker.ithttps://www.fda.gov/media/152162/download  This test is no t yet approved or cleared by the [place] FDA and  has been authorized for detection and/or diagnosis of SARS-CoV-2 by FDA under an Emergency Use Authorization (EUA). This EUA will remain  in effect (meaning this test can be used) for the duration of the COVID-19 declaration under Section 564(b)(1) of the Act, 21 U.S.C.section 360bbb-3(b)(1), unless the authorization is terminated  or revoked sooner.       Influenza A by PCR NEGATIVE NEGATIVE   Influenza B by PCR NEGATIVE NEGATIVE    Comment: (NOTE) The Xpert Xpress SARS-CoV-2/FLU/RSV plus assay is intended as an aid in the diagnosis of influenza from Nasopharyngeal swab specimens and should not be used as a sole basis for treatment. Nasal washings and aspirates are unacceptable for Xpert Xpress SARS-CoV-2/FLU/RSV testing.  Fact Sheet for Patients: BloggerCourse.comhttps://www.fda.gov/media/152166/download  Fact Sheet for Healthcare Providers: SeriousBroker.ithttps://www.fda.gov/media/152162/download  This test is not yet approved or cleared by the [place] FDA and has been authorized for detection and/or diagnosis of SARS-CoV-2 by FDA under an Emergency Use Authorization (EUA). This EUA will remain in effect (meaning this test can be used) for the duration of the COVID-19 declaration under Section 564(b)(1)  of the Act, 21 U.S.C. section 360bbb-3(b)(1), unless the authorization is terminated or revoked.  Performed at [facility], [address] [place], [place] [id number]    CT HEAD WO CONTRAST  Result Date: 11/17/2020 CLINICAL DATA:  Altered mental status. EXAM: CT HEAD WITHOUT CONTRAST TECHNIQUE: Contiguous axial images were obtained from the base of the skull through the vertex without intravenous contrast. COMPARISON:  May 05, 2020 FINDINGS: Brain: There is mild cerebral atrophy with widening of the extra-axial spaces and ventricular dilatation. There are areas of decreased attenuation within the white matter tracts of the supratentorial brain, consistent with microvascular disease changes. Vascular: No hyperdense vessel or unexpected calcification. Skull: Normal. Negative for fracture or focal lesion. Sinuses/Orbits: Mild bilateral ethmoid sinus mucosal thickening is seen. Other: The study is limited secondary to patient motion. IMPRESSION: 1. Generalized cerebral atrophy. 2. No acute intracranial abnormality. Electronically Signed   By: Aram Candelahaddeus  Houston M.D.   On: 11/17/2020 17:17   CT Chest W Contrast  Result Date: 11/17/2020 CLINICAL DATA:  Fever, altered mental status.  Nausea and vomiting. EXAM: CT CHEST, ABDOMEN, AND PELVIS WITH CONTRAST TECHNIQUE: Multidetector CT imaging of the chest, abdomen and pelvis was performed following the standard protocol during bolus administration of intravenous contrast. CONTRAST:  100mL OMNIPAQUE IOHEXOL 300 MG/ML  SOLN COMPARISON:  CT abdomen pelvis 12/20/2019, CT angiography chest 12/24/2019 FINDINGS: CT CHEST FINDINGS Cardiovascular: Normal heart size. No significant pericardial effusion. The thoracic aorta is normal in caliber. Mild atherosclerotic plaque of the thoracic aorta. The main pulmonary artery measures at the upper limits of normal: 3.2 cm. Mediastinum/Nodes: No enlarged mediastinal, hilar, or axillary lymph nodes. Thyroid  gland, trachea, and esophagus demonstrate no significant findings. Lungs/Pleura: Right lower lobe atelectasis with persistent elevated right hemidiaphragm. Limited evaluation for pulmonary nodule due to respiratory motion artifact. No pulmonary mass. No focal consolidation. No pleural effusion. No pneumothorax. Musculoskeletal: No chest wall abnormality. No suspicious lytic or blastic osseous lesions. No acute displaced fracture. Multilevel degenerative changes of the spine. CT ABDOMEN PELVIS FINDINGS Hepatobiliary: No focal liver abnormality. Gallstone noted within the gallbladder lumen. No gallbladder wall thickening or pericholecystic fluid. No biliary dilatation. Pancreas: No focal lesion. Normal pancreatic contour. No surrounding inflammatory changes. No main pancreatic  ductal dilatation. Spleen: Normal in size without focal abnormality. Adrenals/Urinary Tract: No adrenal nodule bilaterally. Bilateral kidneys enhance symmetrically. Exophytic hypodense 2.2 cm lesion within the right kidney likely represent simple renal cysts. Slightly increased density likely due to streak artifact at this level. No hydronephrosis. No hydroureter. The urinary bladder is unremarkable. On delayed imaging, there is no urothelial wall thickening and there are no filling defects in the opacified portions of the bilateral collecting systems or ureters. Stomach/Bowel: Stomach is within normal limits. No evidence of bowel wall thickening or dilatation. 8 cm stool ball within the rectum. The appendix not definitely identified. Vascular/Lymphatic: No abdominal aorta or iliac aneurysm. Mild atherosclerotic plaque of the aorta and its branches. No abdominal, pelvic, or inguinal lymphadenopathy. Reproductive: Difficult to measure grossly stable uterine fibroids. No adnexal lesion. Other: No intraperitoneal free fluid. No intraperitoneal free gas. No organized fluid collection. Musculoskeletal: Tiny fat containing left inguinal hernia. No  suspicious lytic or blastic osseous lesions. No acute displaced fracture. Multilevel degenerative changes of the spine. IMPRESSION: 1. Main pulmonary artery measures at the upper limits of normal. Correlate with pulmonary hypertension. 2. Otherwise no acute intrathoracic abnormality. 3. Cholelithiasis with no CT findings of acute cholecystitis. 4. An 8 cm stool ball within the rectum with no associated findings suggest of stercoral colitis. 5. 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 Electronically Signed   By: Tish Frederickson M.D.   On: 11/17/2020 20:07   CT Abdomen Pelvis W Contrast  Result Date: 11/17/2020 CLINICAL DATA:  Fever, altered mental status.  Nausea and vomiting. EXAM: CT CHEST, ABDOMEN, AND PELVIS WITH CONTRAST TECHNIQUE: Multidetector CT imaging of the chest, abdomen and pelvis was performed following the standard protocol during bolus administration of intravenous contrast. CONTRAST:  OMNIPAQUE IOHEXOL 300 MG/ML  SOLN COMPARISON:  CT abdomen pelvis 12/20/2019, CT angiography chest 12/24/2019 FINDINGS: CT CHEST FINDINGS Cardiovascular: Normal heart size. No significant pericardial effusion. The thoracic aorta is normal in caliber. Mild atherosclerotic plaque of the thoracic aorta. The main pulmonary artery measures at the upper limits of normal: 3.2 cm. Mediastinum/Nodes: No enlarged mediastinal, hilar, or axillary lymph nodes. Thyroid gland, trachea, and esophagus demonstrate no significant findings. Lungs/Pleura: Right lower lobe atelectasis with persistent elevated right hemidiaphragm. Limited evaluation for pulmonary nodule due to respiratory motion artifact. No pulmonary mass. No focal consolidation. No pleural effusion. No pneumothorax. Musculoskeletal: No chest wall abnormality. No suspicious lytic or blastic osseous lesions. No acute displaced fracture. Multilevel degenerative changes of the spine. CT ABDOMEN PELVIS FINDINGS Hepatobiliary: No focal liver abnormality. Gallstone noted  within the gallbladder lumen. No gallbladder wall thickening or pericholecystic fluid. No biliary dilatation. Pancreas: No focal lesion. Normal pancreatic contour. No surrounding inflammatory changes. No main pancreatic ductal dilatation. Spleen: Normal in size without focal abnormality. Adrenals/Urinary Tract: No adrenal nodule bilaterally. Bilateral kidneys enhance symmetrically. Exophytic hypodense 2.2 cm lesion within the right kidney likely represent simple renal cysts. Slightly increased density likely due to streak artifact at this level. No hydronephrosis. No hydroureter. The urinary bladder is unremarkable. On delayed imaging, there is no urothelial wall thickening and there are no filling defects in the opacified portions of the bilateral collecting systems or ureters. Stomach/Bowel: Stomach is within normal limits. No evidence of bowel wall thickening or dilatation. 8 cm stool ball within the rectum. The appendix not definitely identified. Vascular/Lymphatic: No abdominal aorta or iliac aneurysm. Mild atherosclerotic plaque of the aorta and its branches. No abdominal, pelvic, or inguinal lymphadenopathy. Reproductive: Difficult to measure grossly stable uterine  fibroids. No adnexal lesion. Other: No intraperitoneal free fluid. No intraperitoneal free gas. No organized fluid collection. Musculoskeletal: Tiny fat containing left inguinal hernia. No suspicious lytic or blastic osseous lesions. No acute displaced fracture. Multilevel degenerative changes of the spine. IMPRESSION: 1. Main pulmonary artery measures at the upper limits of normal. Correlate with pulmonary hypertension. 2. Otherwise no acute intrathoracic abnormality. 3. Cholelithiasis with no CT findings of acute cholecystitis. 4. An 8 cm stool ball within the rectum with no associated findings suggest of stercoral colitis. 5. 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 Electronically Signed   By: Tish Frederickson M.D.   On: 11/17/2020 20:07     Pending Labs Unresulted Labs (From admission, onward)          Start     Ordered   11/18/20 0500  Hemoglobin A1c  Tomorrow morning,   R        11/17/20 2317   11/17/20 2317  TSH  Add-on,   AD        11/17/20 2316   11/17/20 2317  Vitamin B12  Add-on,   AD        11/17/20 2316   11/17/20 2317  Folate  Add-on,   AD        11/17/20 2316   11/17/20 2317  Ammonia  Add-on,   AD        11/17/20 2316   11/17/20 2317  C-reactive protein  Add-on,   AD        11/17/20 2316   11/17/20 2317  RPR  Add-on,   AD        11/17/20 2316   11/17/20 2317  Procalcitonin - Baseline  Add-on,   AD        11/17/20 2316   11/17/20 2317  Urine rapid drug screen (hosp performed)  Add-on,   AD        11/17/20 2316   11/17/20 1826  Culture, blood (routine x 2)  BLOOD CULTURE X 2,   STAT      11/17/20 1826   11/17/20 1826  Urine culture  Add-on,   AD        11/17/20 1826   11/17/20 1826  Lactic acid, plasma  Now then every 2 hours,   STAT      11/17/20 1826          Vitals/Pain Today's Vitals   11/17/20 1813 11/17/20 1824 11/17/20 1915 11/17/20 2255  BP:   (!) 158/88   Pulse:   91   Resp:   (!) 25   Temp: 100.1 F (37.8 C) (!) 100.4 F (38 C) (!) 101.5 F (38.6 C) (!) 101.1 F (38.4 C)  TempSrc: Oral Rectal Axillary Axillary  SpO2:   92%   PainSc:        Isolation Precautions Airborne and Contact precautions  Medications Medications  0.9 %  sodium chloride infusion (has no administration in time range)  ondansetron (ZOFRAN) injection 4 mg (4 mg Intramuscular Given 11/17/20 1700)  iohexol (OMNIPAQUE) 300 MG/ML solution 100 mL (100 mLs Intravenous Contrast Given 11/17/20 1925)  acetaminophen (TYLENOL) suppository 650 mg (650 mg Rectal Given 11/17/20 1939)    Mobility walks

## 2020-11-17 NOTE — ED Provider Notes (Signed)
I received this patient in signout from Dr. Meredith Mody all.  At time of signout, awaiting medical screening labs and head CT for medical clearance prior to psych c/s.  Labs show no evidence of UTI, WBC 12, CMP unremarkable. Head CT negative. Lactate normal. Labs reassuring against sepsis. LFTs normal.   CT chest/abd/pelvis shows no obvious source of infection. Cholelithiasis without inflammatory changes to suggest cholecystitis and LFTS are reassuring against this source of infection. COVID/flu negative.   She's had stool incontinence here and several episodes of vomiting which raises my suspicion for a gastrointestinal illness. I tried to assess for neck stiffness but patient does not cooperate with any part of the physical exam, including resisting me moving any of her extremities or her neck. Nurse reports she resisted having temperature checked orally and resisted being rolled over for changing. My suspicion for meningitis is low currently.  Discussed admission for vomiting, fever, and AMS with [facility], Dr. Leafy Half.   CRITICAL CARE Performed by: Ambrose Finland Yanelli Zapanta   Total critical care time: 30 minutes  Critical care time was exclusive of separately billable procedures and treating other patients.  Critical care was necessary to treat or prevent imminent or life-threatening deterioration.  Critical care was time spent personally by me on the following activities: development of treatment plan with patient and/or surrogate as well as nursing, discussions with consultants, evaluation of patient's response to treatment, examination of patient, obtaining history from patient or surrogate, ordering and performing treatments and interventions, ordering and review of laboratory studies, ordering and review of radiographic studies, pulse oximetry and re-evaluation of patient's condition.    Nasire Reali, Ambrose Finland, MD 11/17/20 2226

## 2020-11-17 NOTE — ED Notes (Signed)
Patient transported to room 13 and report given to [facility].

## 2020-11-17 NOTE — ED Notes (Signed)
Patient phone and charger locked up in TCU locker 30

## 2020-11-17 NOTE — Progress Notes (Signed)
Pharmacy Antibiotic Note  Becky Hendricks is a 75 y.o. female admitted on 11/17/2020 with hx bipolar, from [facility] via EMS, with report of not eating/drinking well, and periods agitated behavior in the past few days  Pharmacy has been consulted to dose vancomycin for meningitis   Plan: Vancomycin 1gm IV x 1 then 1250mg  q24h (AUC 525.2, used Scr 0.8) Ampicillin 2gm IV q4h per MD CTX 2gm IV q12h per MD Follow renal function, cultures and clinical course    Temp (24hrs), Avg:100.3 F (37.9 C), Min:98.9 F (37.2 C), Max:101.5 F (38.6 C)  Recent Labs  Lab 11/17/20 1600 11/17/20 1842  WBC 12.0*  --   CREATININE 0.51  --   LATICACIDVEN  --  1.4    CrCl cannot be calculated (Unknown ideal weight.).    Allergies  Allergen Reactions  . Chlorhexidine Gluconate Itching  . Acetazolamide Er Rash    Antimicrobials this admission: 4/7 CTX >> 4/8 vanc >> 4/8 amp >> Dose adjustments this admission:   Microbiology results: 4/7 BCx:  4/7 UCx:   Thank you for allowing pharmacy to be a part of this patient's care.  6/7 RPh 11/17/2020, 11:37 PM

## 2020-11-17 NOTE — ED Notes (Signed)
Pt resting. VSS. Pt still will not respond to verbal stimuli.

## 2020-11-17 NOTE — ED Notes (Signed)
Patient arrives to room 30.

## 2020-11-17 NOTE — ED Provider Notes (Addendum)
[facility]-EMERGENCY DEPT Provider Note   CSN: [id number] Arrival date & time: 11/17/20  1424     History Chief Complaint  Patient presents with  . ivc    Becky Hendricks is a 75 y.o. female.  Patient with hx bipolar, from [facility] via EMS, with report of not eating/drinking well, and periods agitated behavior in the past few days. Patient limited historian, not verbally responsive to questions asked - level 5 caveat. No report of fever. No report of trauma/falls. Unknown if compliant w meds. No report of vomiting. No report of specific physical c/o or pain.   The history is provided by the patient and the EMS personnel. The history is limited by the condition of the patient.       Past Medical History:  Diagnosis Date  . Atrial fibrillation (HCC)   . Bipolar disorder (HCC)   . Bipolar disorder (HCC) 04/27/2020  . Clostridium difficile diarrhea 08/02/2019  . Dehydration   . Essential hypertension   . Fall 02/11/2020  . Morbid obesity (HCC)   . Type 2 diabetes mellitus (HCC)   . Weakness     Patient Active Problem List   Diagnosis Date Noted  . Immunization due 07/26/2020  . Psychosis (HCC)   . Bipolar disorder (HCC) 04/27/2020  . Fall 02/11/2020  . Drug rash   . Acute osteomyelitis of pelvic region and thigh (HCC)   . Palliative care by specialist   . Goals of care, counseling/discussion   . Edema of upper extremity   . Hyperphosphatemia   . Cardiac arrest (HCC)   . Encounter for central line placement   . Acute respiratory failure (HCC)   . Sacral wound   . Hypoxia   . Severe sepsis (HCC) 12/03/2019  . Osteomyelitis (HCC) 12/03/2019  . Abscess of multiple sites of buttock 12/03/2019  . AF (paroxysmal atrial fibrillation) (HCC) 12/03/2019  . Respiratory failure with hypoxia (HCC) 09/15/2019  . Pneumonia due to COVID-19 virus 09/15/2019  . Acute metabolic encephalopathy 09/15/2019  . UTI (urinary tract infection), bacterial 09/15/2019  .  Pressure injury of skin 09/10/2019  . AKI (acute kidney injury) (HCC)   . Acute respiratory distress syndrome (ARDS) due to COVID-19 virus (HCC) 09/07/2019  . Morbid obesity with BMI of 40.0-44.9, adult (HCC) 02/27/2018    Past Surgical History:  Procedure Laterality Date  . INCISION AND DRAINAGE PERIRECTAL ABSCESS Left 12/04/2019   Procedure: IRRIGATION AND DEBRIDEMENT LEFT BUTTOCK ABSCESS;  Surgeon: Violeta Gelinas, MD;  [facility];  Service: General;  Laterality: Left;  . INCISION AND DRAINAGE PERIRECTAL ABSCESS Left 12/10/2019   Procedure: REPEAT IRRIGATION AND DEBRIDEMENT BUTTOCK  ABSCESS;  Surgeon: Abigail Miyamoto, MD;  Location: [facility] OR;  Service: General;  Laterality: Left;     OB History   No obstetric history on file.     Family History  Family history unknown: Yes    Social History   Tobacco Use  . Smoking status: Never Smoker  . Smokeless tobacco: Never Used  Vaping Use  . Vaping Use: Unknown  Substance Use Topics  . Alcohol use: Not Currently  . Drug use: Not Currently    Home Medications Prior to Admission medications   Medication Sig Start Date End Date Taking? Authorizing Provider  Amino Acids-Protein Hydrolys (FEEDING SUPPLEMENT, PRO-STAT SUGAR FREE 64,) LIQD Take 30 mLs by mouth 2 (two) times daily. 01/05/20  Yes Regalado, Belkys A, MD  apixaban (ELIQUIS) 5 MG TABS tablet Take 5 mg by mouth  2 (two) times daily.   Yes [provider]  atorvastatin (LIPITOR) 10 MG tablet Take 10 mg by mouth daily.   Yes [provider]  calcium carbonate (TUMS - DOSED IN MG ELEMENTAL CALCIUM) 500 MG chewable tablet Chew 500 mg by mouth daily.   Yes [provider]  carvedilol (COREG) 3.125 MG tablet Take 3.125 mg by mouth 2 (two) times daily. 11/01/20  Yes [provider]  Cholecalciferol (VITAMIN D) 125 MCG (5000 UT) CAPS Take 10,000 Units by mouth once a week. Wednesday   Yes [provider]  cloNIDine HCl (KAPVAY) 0.1 MG TB12  ER tablet Take 0.1 mg by mouth 2 (two) times daily. 11/02/20  Yes [provider]  diphenhydrAMINE (BENADRYL) 2 % cream Apply 1 application topically 3 (three) times daily as needed for itching.   Yes [provider]  divalproex (DEPAKOTE) 500 MG DR tablet Take 500 mg by mouth 2 (two) times daily.   Yes [provider]  docusate sodium (COLACE) 100 MG capsule Take 1 capsule (100 mg total) by mouth 2 (two) times daily as needed for mild constipation. 01/05/20  Yes Regalado, Belkys A, MD  furosemide (LASIX) 20 MG tablet Take 20 mg by mouth every other day. 11/07/20  Yes [provider]  gabapentin (NEURONTIN) 100 MG capsule Take 100 mg by mouth 2 (two) times daily. 11/01/20  Yes [provider]  insulin glargine (LANTUS) 100 UNIT/ML injection Inject 4 Units into the skin at bedtime. Notify PEC for CBG less than 100 or greater than 250.   Yes [provider]  Ipratropium-Albuterol (COMBIVENT RESPIMAT) 20-100 MCG/ACT AERS respimat Inhale 1 puff into the lungs every 12 (twelve) hours.   Yes [provider]  losartan (COZAAR) 25 MG tablet Take 25 mg by mouth daily.   Yes [provider]  Multiple Vitamin (MULTIVITAMIN WITH MINERALS) TABS tablet Take 1 tablet by mouth daily.   Yes [provider]  pantoprazole (PROTONIX) 40 MG tablet Take 1 tablet (40 mg total) by mouth 2 (two) times daily. Patient taking differently: Take 40 mg by mouth daily. 01/05/20  Yes Regalado, Belkys A, MD  QUEtiapine (SEROQUEL) 50 MG tablet Take 50 mg by mouth at bedtime. Take with 200mg  10/28/20  Yes [provider]  sennosides-docusate sodium (SENOKOT-S) 8.6-50 MG tablet Take 1 tablet by mouth daily.   Yes [provider]  SEROQUEL 100 MG tablet Take 200 mg by mouth at bedtime. Take with 50mg  11/07/20  Yes [provider]  sitaGLIPtin-metformin (JANUMET) 50-1000 MG tablet Take 1 tablet by mouth 2 (two) times daily with a meal.    Yes [provider]  carvedilol (COREG) 25 MG tablet Take 12.5 mg by mouth 2 (two) times daily with a meal.    [provider]  divalproex (DEPAKOTE SPRINKLE) 125 MG capsule Take 125 mg by mouth daily. Every afternoon.    [provider]  Emollient (EUCERIN INTENSIVE REPAIR) LOTN Apply 1 application topically daily. To both knees for rash.    [provider]  Ensure Max Protein (ENSURE MAX PROTEIN) LIQD Take 330 mLs (11 oz total) by mouth 2 (two) times daily. 01/27/20   11/09/20, MD  furosemide (LASIX) 40 MG tablet Take 40 mg by mouth daily.     [provider]  hydrALAZINE (APRESOLINE) 25 MG tablet Take 1 tablet (25 mg total) by mouth every 8 (eight) hours. 01/23/20   Lonia Blood, MD  hydrocortisone cream 1 %  Apply topically 2 (two) times daily. Patient taking differently: Apply 1 application topically 2 (two) times daily. 01/05/20   Regalado, Belkys A, MD  LORazepam (ATIVAN) 0.5 MG tablet Take 0.5 mg by mouth 2 (two) times daily as needed for anxiety.    [provider]  NON FORMULARY Take 120 mLs by mouth daily. Medpass to promote healing    [provider]  nutrition supplement, JUVEN, (JUVEN) PACK Take 1 packet by mouth 2 (two) times daily between meals. 01/27/20   Lonia BloodMcClung, Jeffrey T, MD  OLANZapine (ZYPREXA) 10 MG tablet Take 1 tablet (10 mg total) by mouth at bedtime. 05/23/20   Benjiman CorePickering, Nathan, MD  zinc sulfate 220 (50 Zn) MG capsule Take 220 mg by mouth daily.    [provider]    Allergies    Chlorhexidine gluconate and Acetazolamide er  Review of Systems   Review of Systems  Unable to perform ROS: Patient unresponsive  pt not verbally responsive to questions - level 5 caveat.      Physical Exam Updated Vital Signs BP (!) 217/105 (BP Location: Left Arm)   Pulse 79   Temp 98.9 F (37.2 C) (Oral)   Resp 16   LMP  (LMP Unknown)   SpO2 96%   Physical Exam Vitals and nursing note reviewed.   Constitutional:      Appearance: Normal appearance. She is well-developed.  HENT:     Head: Atraumatic.     Nose: Nose normal.     Mouth/Throat:     Mouth: Mucous membranes are moist.  Eyes:     General: No scleral icterus.    Conjunctiva/sclera: Conjunctivae normal.     Pupils: Pupils are equal, round, and reactive to light.  Neck:     Trachea: No tracheal deviation.     Comments: No stiffness or rigidity.  Cardiovascular:     Rate and Rhythm: Normal rate and regular rhythm.     Pulses: Normal pulses.     Heart sounds: Normal heart sounds. No murmur heard. No friction rub. No gallop.   Pulmonary:     Effort: Pulmonary effort is normal. No respiratory distress.     Breath sounds: Normal breath sounds.  Abdominal:     General: Bowel sounds are normal. There is no distension.     Palpations: Abdomen is soft.     Tenderness: There is no abdominal tenderness. There is no guarding.  Genitourinary:    Comments: No cva tenderness.  Musculoskeletal:        General: No tenderness.     Cervical back: Normal range of motion and neck supple. No rigidity. No muscular tenderness.  Skin:    General: Skin is warm and dry.     Findings: No rash.     Comments: Dressing to sacral area. Minimal pressure injury/sore to sacrum, skin is intact, no cellulitis/infection.   Neurological:     Mental Status: She is alert.     Comments: Alert, verbalizations not grossly dysarthric. Moves extremities purposefully.   Psychiatric:     Comments: Alert, content.      ED Results / Procedures / Treatments   Labs (all labs ordered are listed, but only abnormal results are displayed) Labs Reviewed  CBC  COMPREHENSIVE METABOLIC PANEL  URINALYSIS, ROUTINE W REFLEX MICROSCOPIC  VALPROIC ACID LEVEL    EKG None  Radiology No results found.  Procedures Procedures   Medications Ordered in ED Medications - No data to display  ED Course  I have  reviewed the triage vital signs and the nursing  notes.  Pertinent labs & imaging results that were available during my care of the patient were reviewed by me and considered in my medical decision making (see chart for details).    MDM Rules/Calculators/A&P                         Labs sent.   Reviewed nursing notes and prior charts for additional history.   Labs pending - signed out to Dr Clarene Duke to check labs and CT when resulted. If acute medical/lab issue - admit. If medically clear, behavioral health evaluation.    Final Clinical Impression(s) / ED Diagnoses Final diagnoses:  None    Rx / DC Orders ED Discharge Orders    None        Cathren Laine, MD 11/17/20 1526

## 2020-11-17 NOTE — ED Notes (Signed)
In and out cath performed 59F to obtain urine, clean gown placed on patient and clean linen

## 2020-11-17 NOTE — ED Triage Notes (Signed)
BIBA Per EMS: Pt coming from [facility] with complaints of aggressive behavior, not eating; IVC by [facility]  Hx bipolar; schizo A&Ox3 at baseline  162/90 18 97% 160 CBG  97.8 temp

## 2020-11-17 NOTE — ED Notes (Signed)
Patient has vomited yellow colored emesis with undigested carrots.  Complete bed linen change done, peri care.  Incont of bowel and bladder.  Medicated for vomiting as ordered

## 2020-11-17 NOTE — ED Notes (Signed)
Pt placed on 2L nasal cannula. O2 saturation maintained in 80s with good pleth on room air. Purewick in place

## 2020-11-17 NOTE — ED Notes (Signed)
Patient was transferred from ER stretcher to bed-  Patient keeps her eyes closed and speaks loudly when touched or turned.  Peri care done- incont of bowel and bladder.  Protective dressing noted to coccyx area-  Dressing lifted to see perform skin assessment.  Healed indention noted to coccyx area.  Dr Denton Lank at the bedside for assessment.  Both knees appear to have scarring, appears pink.  Deformity noted to the right hand, appears to be contracted from previous history.

## 2020-11-18 ENCOUNTER — Inpatient Hospital Stay (HOSPITAL_COMMUNITY): Payer: Medicare Other

## 2020-11-18 DIAGNOSIS — A4102 Sepsis due to Methicillin resistant Staphylococcus aureus: Secondary | ICD-10-CM | POA: Diagnosis present

## 2020-11-18 DIAGNOSIS — B9562 Methicillin resistant Staphylococcus aureus infection as the cause of diseases classified elsewhere: Secondary | ICD-10-CM

## 2020-11-18 DIAGNOSIS — R7881 Bacteremia: Secondary | ICD-10-CM | POA: Diagnosis not present

## 2020-11-18 DIAGNOSIS — G9340 Encephalopathy, unspecified: Secondary | ICD-10-CM

## 2020-11-18 DIAGNOSIS — K5900 Constipation, unspecified: Secondary | ICD-10-CM | POA: Diagnosis present

## 2020-11-18 DIAGNOSIS — G40909 Epilepsy, unspecified, not intractable, without status epilepticus: Secondary | ICD-10-CM | POA: Diagnosis present

## 2020-11-18 DIAGNOSIS — G9341 Metabolic encephalopathy: Secondary | ICD-10-CM

## 2020-11-18 DIAGNOSIS — M4628 Osteomyelitis of vertebra, sacral and sacrococcygeal region: Secondary | ICD-10-CM | POA: Diagnosis present

## 2020-11-18 DIAGNOSIS — F3160 Bipolar disorder, current episode mixed, unspecified: Secondary | ICD-10-CM | POA: Diagnosis not present

## 2020-11-18 DIAGNOSIS — Z794 Long term (current) use of insulin: Secondary | ICD-10-CM

## 2020-11-18 DIAGNOSIS — E785 Hyperlipidemia, unspecified: Secondary | ICD-10-CM | POA: Diagnosis present

## 2020-11-18 DIAGNOSIS — R6510 Systemic inflammatory response syndrome (SIRS) of non-infectious origin without acute organ dysfunction: Secondary | ICD-10-CM

## 2020-11-18 DIAGNOSIS — F312 Bipolar disorder, current episode manic severe with psychotic features: Secondary | ICD-10-CM | POA: Diagnosis present

## 2020-11-18 DIAGNOSIS — Z515 Encounter for palliative care: Secondary | ICD-10-CM | POA: Diagnosis not present

## 2020-11-18 DIAGNOSIS — E1165 Type 2 diabetes mellitus with hyperglycemia: Secondary | ICD-10-CM | POA: Diagnosis present

## 2020-11-18 DIAGNOSIS — E871 Hypo-osmolality and hyponatremia: Secondary | ICD-10-CM | POA: Diagnosis present

## 2020-11-18 DIAGNOSIS — Z20822 Contact with and (suspected) exposure to covid-19: Secondary | ICD-10-CM | POA: Diagnosis present

## 2020-11-18 DIAGNOSIS — F419 Anxiety disorder, unspecified: Secondary | ICD-10-CM | POA: Diagnosis present

## 2020-11-18 DIAGNOSIS — F3111 Bipolar disorder, current episode manic without psychotic features, mild: Secondary | ICD-10-CM | POA: Diagnosis not present

## 2020-11-18 DIAGNOSIS — Z888 Allergy status to other drugs, medicaments and biological substances status: Secondary | ICD-10-CM | POA: Diagnosis not present

## 2020-11-18 DIAGNOSIS — R339 Retention of urine, unspecified: Secondary | ICD-10-CM | POA: Diagnosis present

## 2020-11-18 DIAGNOSIS — E1169 Type 2 diabetes mellitus with other specified complication: Secondary | ICD-10-CM | POA: Diagnosis present

## 2020-11-18 DIAGNOSIS — Z6831 Body mass index (BMI) 31.0-31.9, adult: Secondary | ICD-10-CM | POA: Diagnosis not present

## 2020-11-18 DIAGNOSIS — R6520 Severe sepsis without septic shock: Secondary | ICD-10-CM | POA: Diagnosis present

## 2020-11-18 DIAGNOSIS — E669 Obesity, unspecified: Secondary | ICD-10-CM | POA: Diagnosis present

## 2020-11-18 DIAGNOSIS — Z7189 Other specified counseling: Secondary | ICD-10-CM | POA: Diagnosis not present

## 2020-11-18 DIAGNOSIS — R112 Nausea with vomiting, unspecified: Secondary | ICD-10-CM

## 2020-11-18 DIAGNOSIS — F209 Schizophrenia, unspecified: Secondary | ICD-10-CM | POA: Diagnosis not present

## 2020-11-18 DIAGNOSIS — I480 Paroxysmal atrial fibrillation: Secondary | ICD-10-CM | POA: Diagnosis present

## 2020-11-18 DIAGNOSIS — Z881 Allergy status to other antibiotic agents status: Secondary | ICD-10-CM | POA: Diagnosis not present

## 2020-11-18 DIAGNOSIS — G928 Other toxic encephalopathy: Secondary | ICD-10-CM | POA: Diagnosis present

## 2020-11-18 DIAGNOSIS — I6783 Posterior reversible encephalopathy syndrome: Secondary | ICD-10-CM | POA: Diagnosis not present

## 2020-11-18 DIAGNOSIS — E782 Mixed hyperlipidemia: Secondary | ICD-10-CM

## 2020-11-18 DIAGNOSIS — I110 Hypertensive heart disease with heart failure: Secondary | ICD-10-CM | POA: Diagnosis present

## 2020-11-18 DIAGNOSIS — I5032 Chronic diastolic (congestive) heart failure: Secondary | ICD-10-CM | POA: Diagnosis present

## 2020-11-18 DIAGNOSIS — Z8674 Personal history of sudden cardiac arrest: Secondary | ICD-10-CM | POA: Diagnosis not present

## 2020-11-18 HISTORY — DX: Encephalopathy, unspecified: G93.40

## 2020-11-18 LAB — BLOOD CULTURE ID PANEL (REFLEXED) - BCID2

## 2020-11-18 LAB — COMPREHENSIVE METABOLIC PANEL
ALT: 13 U/L (ref 0–44)
AST: 13 U/L — ABNORMAL LOW (ref 15–41)
Albumin: 3.3 g/dL — ABNORMAL LOW (ref 3.5–5.0)
Alkaline Phosphatase: 43 U/L (ref 38–126)
Anion gap: 10 (ref 5–15)
BUN: 18 mg/dL (ref 8–23)
CO2: 27 mmol/L (ref 22–32)
Calcium: 8.9 mg/dL (ref 8.9–10.3)
Chloride: 96 mmol/L — ABNORMAL LOW (ref 98–111)
Creatinine, Ser: 0.69 mg/dL (ref 0.44–1.00)
GFR, Estimated: >60 mL/min (ref >60)
Glucose, Bld: 155 mg/dL — ABNORMAL HIGH (ref 70–99)
Potassium: 4.1 mmol/L (ref 3.5–5.1)
Sodium: 133 mmol/L — ABNORMAL LOW (ref 135–145)
Total Bilirubin: 0.6 mg/dL (ref 0.3–1.2)
Total Protein: 6.6 g/dL (ref 6.5–8.1)

## 2020-11-18 LAB — CSF CELL COUNT WITH DIFFERENTIAL
Eosinophils, CSF: 0 % (ref 0–1)
Eosinophils, CSF: 0 % (ref 0–1)
Lymphs, CSF: 60 % (ref 40–80)
Lymphs, CSF: 60 % (ref 40–80)
Monocyte-Macrophage-Spinal Fluid: 24 % (ref 15–45)
Monocyte-Macrophage-Spinal Fluid: 13 % — ABNORMAL LOW (ref 15–45)
RBC Count, CSF: 3 /mm3 — ABNORMAL HIGH
RBC Count, CSF: 1 /mm3 — ABNORMAL HIGH
Segmented Neutrophils-CSF: 16 % — ABNORMAL HIGH (ref 0–6)
Segmented Neutrophils-CSF: 27 % — ABNORMAL HIGH (ref 0–6)
Tube #: 1
Tube #: 4
WBC, CSF: 3 /mm3 (ref 0–5)
WBC, CSF: 6 /mm3 — ABNORMAL HIGH (ref 0–5)

## 2020-11-18 LAB — CBC WITH DIFFERENTIAL/PLATELET
Abs Immature Granulocytes: 0.04 10*3/uL (ref 0.00–0.07)
Basophils Absolute: 0 10*3/uL (ref 0.0–0.1)
Basophils Relative: 0 %
Eosinophils Absolute: 0 10*3/uL (ref 0.0–0.5)
Eosinophils Relative: 0 %
HCT: 39.7 % (ref 36.0–46.0)
Hemoglobin: 12.7 g/dL (ref 12.0–15.0)
Immature Granulocytes: 0 %
Lymphocytes Relative: 27 %
Lymphs Abs: 3.1 10*3/uL (ref 0.7–4.0)
MCH: 28.7 pg (ref 26.0–34.0)
MCHC: 32 g/dL (ref 30.0–36.0)
MCV: 89.8 fL (ref 80.0–100.0)
Monocytes Absolute: 1.2 10*3/uL — ABNORMAL HIGH (ref 0.1–1.0)
Monocytes Relative: 10 %
Neutro Abs: 7.4 10*3/uL (ref 1.7–7.7)
Neutrophils Relative %: 63 %
Platelets: 227 10*3/uL (ref 150–400)
RBC: 4.42 MIL/uL (ref 3.87–5.11)
RDW: 14.2 % (ref 11.5–15.5)
WBC: 11.8 10*3/uL — ABNORMAL HIGH (ref 4.0–10.5)
nRBC: 0 % (ref 0.0–0.2)

## 2020-11-18 LAB — GLUCOSE, CAPILLARY
Glucose-Capillary: 144 mg/dL — ABNORMAL HIGH (ref 70–99)
Glucose-Capillary: 165 mg/dL — ABNORMAL HIGH (ref 70–99)
Glucose-Capillary: 151 mg/dL — ABNORMAL HIGH (ref 70–99)
Glucose-Capillary: 110 mg/dL — ABNORMAL HIGH (ref 70–99)

## 2020-11-18 LAB — CBG MONITORING, ED: Glucose-Capillary: 130 mg/dL — ABNORMAL HIGH (ref 70–99)

## 2020-11-18 LAB — VITAMIN B12: Vitamin B-12: 211 pg/mL (ref 180–914)

## 2020-11-18 LAB — PROCALCITONIN
Procalcitonin: <0.1 ng/mL
Procalcitonin: <0.1 ng/mL

## 2020-11-18 LAB — CORTISOL-AM, BLOOD: Cortisol - AM: 12.9 ug/dL (ref 6.7–22.6)

## 2020-11-18 LAB — MAGNESIUM: Magnesium: 1.7 mg/dL (ref 1.7–2.4)

## 2020-11-18 LAB — LACTIC ACID, PLASMA
Lactic Acid, Venous: 1.5 mmol/L (ref 0.5–1.9)
Lactic Acid, Venous: 1.5 mmol/L (ref 0.5–1.9)

## 2020-11-18 LAB — PROTIME-INR
INR: 1.1 (ref 0.8–1.2)
Prothrombin Time: 13.4 seconds (ref 11.4–15.2)

## 2020-11-18 LAB — PROTEIN AND GLUCOSE, CSF
Glucose, CSF: 87 mg/dL — ABNORMAL HIGH (ref 40–70)
Total  Protein, CSF: 31 mg/dL (ref 15–45)

## 2020-11-18 LAB — RPR: RPR Ser Ql: NONREACTIVE

## 2020-11-18 LAB — APTT: aPTT: 32 seconds (ref 24–36)

## 2020-11-18 LAB — FOLATE: Folate: 35.1 ng/mL (ref >5.9)

## 2020-11-18 LAB — C-REACTIVE PROTEIN: CRP: 1.1 mg/dL — ABNORMAL HIGH (ref <1.0)

## 2020-11-18 LAB — AMMONIA: Ammonia: 22 umol/L (ref 9–35)

## 2020-11-18 LAB — HEMOGLOBIN A1C
Hgb A1c MFr Bld: 6.1 % — ABNORMAL HIGH (ref 4.8–5.6)
Mean Plasma Glucose: 128.37 mg/dL

## 2020-11-18 LAB — TSH: TSH: 0.771 u[IU]/mL (ref 0.350–4.500)

## 2020-11-18 IMAGING — RF DG SPINAL PUNCT LUMBAR DIAG WITH FL CT GUIDANCE
1 series · 1 of 1 positions shown · non-contrast
Comparison: CT abdomen pelvis [DATE]

CLINICAL DATA: Encephalopathy.  Fever.  Rule out meningitis

EXAM:
DIAGNOSTIC LUMBAR PUNCTURE UNDER FLUOROSCOPIC GUIDANCE

[Series 1: cp_standard · 0.17mm/px · 1 of 1 slices shown]
[im 1/1]
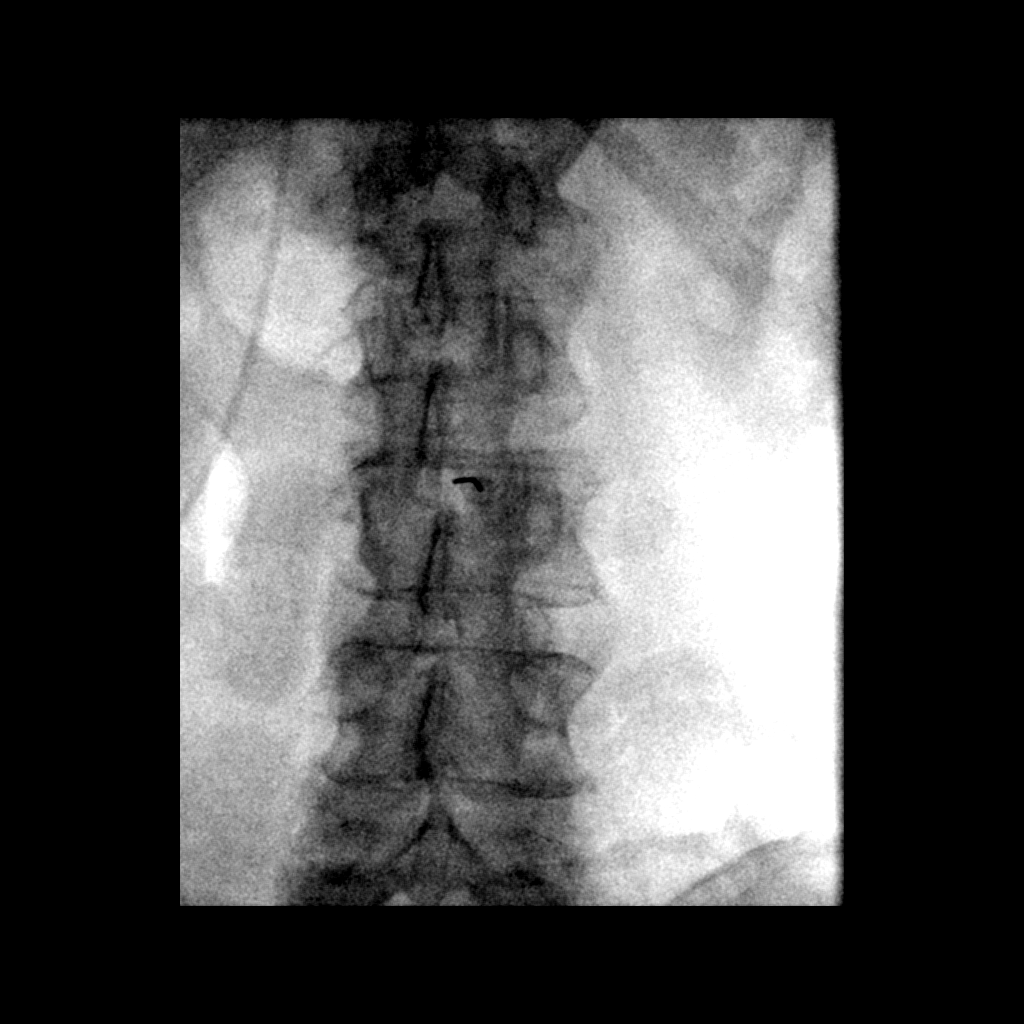

[1 of 1 positions shown; findings below may reference images not displayed]

FLUOROSCOPY TIME:  Fluoroscopy Time:  0 minutes 48 seconds

Radiation Exposure Index (if provided by the fluoroscopic device):

Number of Acquired Spot Images: 1

PROCEDURE:
Patient was sedated prior to the study. The patient was
uncooperative.

Informed consent was obtained from the patient's brother by
telephone prior to the procedure, including potential complications
of headache, allergy, and pain. With the patient prone, the lower
back was prepped with Betadine. 1% Lidocaine was used for local
anesthesia. Lumbar puncture was performed at the L2-3 level using a
20 gauge needle with return of clear CSF with an opening pressure of
13 cm water. Six ml of CSF were obtained for laboratory studies. The
patient tolerated the procedure well and there were no apparent
complications.
IMPRESSION: Successful lumbar puncture.  Clear CSF.

## 2020-11-18 MED ORDER — MAGNESIUM SULFATE 2 GM/50ML IV SOLN
2.0000 g | Freq: Once | INTRAVENOUS | Status: AC
  Administered 2020-11-18: 2 g via INTRAVENOUS
  Filled 2020-11-18: qty 50

## 2020-11-18 MED ORDER — ONDANSETRON HCL 4 MG/2ML IJ SOLN: 4.0000 mg | Freq: Four times a day (QID) | INTRAMUSCULAR | Status: DC | PRN

## 2020-11-18 MED ORDER — LORAZEPAM 2 MG/ML IJ SOLN
2.0000 mg | Freq: Once | INTRAMUSCULAR | Status: AC | PRN
  Administered 2020-11-18: 2 mg via INTRAVENOUS
  Filled 2020-11-18: qty 1

## 2020-11-18 MED ORDER — QUETIAPINE FUMARATE 200 MG PO TABS
200.0000 mg | ORAL_TABLET | Freq: Every day | ORAL | Status: DC
  Administered 2020-11-20 – 2020-11-21 (×2): 200 mg via ORAL
  Filled 2020-11-18 (×4): qty 1

## 2020-11-18 MED ORDER — ONDANSETRON HCL 4 MG PO TABS: 4.0000 mg | ORAL_TABLET | Freq: Four times a day (QID) | ORAL | Status: DC | PRN

## 2020-11-18 MED ORDER — INSULIN ASPART 100 UNIT/ML ~~LOC~~ SOLN
0.0000 [IU] | Freq: Four times a day (QID) | SUBCUTANEOUS | Status: DC
  Administered 2020-11-18 (×2): 2 [IU] via SUBCUTANEOUS
  Administered 2020-11-18 – 2020-11-20 (×2): 3 [IU] via SUBCUTANEOUS
  Administered 2020-11-21: 1 [IU] via SUBCUTANEOUS
  Administered 2020-11-22 – 2020-11-23 (×3): 3 [IU] via SUBCUTANEOUS
  Administered 2020-11-24: 2 [IU] via SUBCUTANEOUS
  Administered 2020-11-24: 3 [IU] via SUBCUTANEOUS
  Administered 2020-11-25 (×2): 2 [IU] via SUBCUTANEOUS
  Administered 2020-11-26 (×2): 3 [IU] via SUBCUTANEOUS
  Administered 2020-11-26 – 2020-11-27 (×2): 5 [IU] via SUBCUTANEOUS
  Administered 2020-11-27 (×2): 2 [IU] via SUBCUTANEOUS
  Administered 2020-11-27 – 2020-11-28 (×3): 3 [IU] via SUBCUTANEOUS
  Administered 2020-11-28 – 2020-11-29 (×2): 5 [IU] via SUBCUTANEOUS
  Administered 2020-11-29 (×2): 3 [IU] via SUBCUTANEOUS
  Administered 2020-11-29: 5 [IU] via SUBCUTANEOUS
  Administered 2020-11-29: 3 [IU] via SUBCUTANEOUS
  Administered 2020-11-30: 2 [IU] via SUBCUTANEOUS
  Administered 2020-11-30: 8 [IU] via SUBCUTANEOUS
  Administered 2020-11-30: 5 [IU] via SUBCUTANEOUS
  Administered 2020-12-01: 3 [IU] via SUBCUTANEOUS
  Administered 2020-12-01: 15 [IU] via SUBCUTANEOUS
  Administered 2020-12-01: 2 [IU] via SUBCUTANEOUS
  Administered 2020-12-01 (×2): 5 [IU] via SUBCUTANEOUS
  Administered 2020-12-02: 3 [IU] via SUBCUTANEOUS
  Administered 2020-12-02: 5 [IU] via SUBCUTANEOUS
  Administered 2020-12-02: 3 [IU] via SUBCUTANEOUS
  Filled 2020-11-18: qty 0.15

## 2020-11-18 MED ORDER — ACETAMINOPHEN 325 MG PO TABS: 650.0000 mg | ORAL_TABLET | Freq: Four times a day (QID) | ORAL | Status: DC | PRN

## 2020-11-18 MED ORDER — HYDROXYZINE HCL 50 MG/ML IM SOLN
50.0000 mg | Freq: Once | INTRAMUSCULAR | Status: AC
  Administered 2020-11-18: 50 mg via INTRAMUSCULAR
  Filled 2020-11-18: qty 1

## 2020-11-18 MED ORDER — QUETIAPINE FUMARATE 50 MG PO TABS
50.0000 mg | ORAL_TABLET | Freq: Every day | ORAL | Status: DC
  Administered 2020-11-20 – 2020-11-21 (×2): 50 mg via ORAL
  Filled 2020-11-18 (×5): qty 1

## 2020-11-18 MED ORDER — DEXAMETHASONE SODIUM PHOSPHATE 10 MG/ML IJ SOLN
10.0000 mg | Freq: Once | INTRAMUSCULAR | Status: AC
  Administered 2020-11-18: 10 mg via INTRAVENOUS
  Filled 2020-11-18: qty 1

## 2020-11-18 MED ORDER — ACETAMINOPHEN 650 MG RE SUPP: 650.0000 mg | Freq: Four times a day (QID) | RECTAL | Status: DC | PRN

## 2020-11-18 MED ORDER — DIVALPROEX SODIUM 250 MG PO DR TAB: 500.0000 mg | DELAYED_RELEASE_TABLET | Freq: Two times a day (BID) | ORAL | Status: DC

## 2020-11-18 MED ORDER — SODIUM CHLORIDE 0.9 % IV SOLN: INTRAVENOUS | Status: DC

## 2020-11-18 MED ORDER — LIDOCAINE HCL 1 % IJ SOLN
INTRAMUSCULAR | Status: AC
  Filled 2020-11-18: qty 20

## 2020-11-18 MED ORDER — SODIUM CHLORIDE 0.9 % IV SOLN: INTRAVENOUS | Status: AC

## 2020-11-18 NOTE — Consult Note (Addendum)
WOC Nurse Consult Note: Reason for Consult: Consult requested for sacrum.  Pt is familiar to [facility] team from previous admission 9/21 when she had a chronic Stage 4 sacrum wound.  This is now pink dry healed scar tissue without an open wound, located in a deep valley.  It is difficult to keep the wound from becoming soiled since pt is frequently incontinent of stool. There are patchy areas of red moist partial thickness skin loss to bilat buttocks; appearance is consistent with moisture associated skin damage.  Dressing procedure/placement/frequency: Topical treatment orders provided for bedside nurses to perform to protect from further injury; apply barrier cream to sacrum/buttocks PRN with each turning and cleaning episode. Please re-consult if further assistance is needed.  Thank-you,  Cammie Mcgee MSN, RN, CWOCN, [place], CNS [phone]

## 2020-11-18 NOTE — Plan of Care (Signed)
?  Problem: Coping: ?Goal: Level of anxiety will decrease ?Outcome: Progressing ?  ?Problem: Safety: ?Goal: Ability to remain free from injury will improve ?Outcome: Progressing ?  ?

## 2020-11-18 NOTE — TOC Initial Note (Signed)
Transition of Care [facility]) - Initial/Assessment Note    Patient Details  Name: Lezly Rumpf MRN: [id number] Date of Birth: 1946-01-25  [facility] CM/SW Contact:    Bartholome Bill, RN Phone Number: 11/18/2020, 2:55 PM  Clinical Narrative:                  Received incomplete IVC paperwork from ED. First exam not completed prior to transfer to 6E. Attending made aware. Will await psych eval to see if IVC appropriate. TOC will continue to follow.    Activities of Daily Living Home Assistive Devices/Equipment: Grab bars around toilet,Grab bars in shower,Hand-held shower hose,Hospital bed,Blood pressure cuff,CBG Meter,Scales ADL Screening (condition at time of admission) Patient's cognitive ability adequate to safely complete daily activities?: No Is the patient deaf or have difficulty hearing?: No Does the patient have difficulty seeing, even when wearing glasses/contacts?: No Does the patient have difficulty concentrating, remembering, or making decisions?: Yes Patient able to express need for assistance with ADLs?: Yes Does the patient have difficulty dressing or bathing?: Yes Independently performs ADLs?: No Communication: Independent Dressing (OT): Needs assistance Is this a change from baseline?: Pre-admission baseline Grooming: Independent Feeding: Independent Bathing: Needs assistance Is this a change from baseline?: Pre-admission baseline Toileting: Needs assistance Is this a change from baseline?: Pre-admission baseline In/Out Bed: Needs assistance Is this a change from baseline?: Pre-admission baseline Walks in Home: Needs assistance Is this a change from baseline?: Pre-admission baseline Does the patient have difficulty walking or climbing stairs?: Yes (secondary to weakness) Weakness of Legs: Both Weakness of Arms/Hands: None  Permission Sought/Granted                  Emotional Assessment              Admission diagnosis:  SIRS (systemic  inflammatory response syndrome) (HCC) [R65.10] Encephalopathy acute [G93.40] Patient Active Problem List   Diagnosis Date Noted  . Constipation 11/18/2020  . Encephalopathy acute 11/18/2020  . SIRS (systemic inflammatory response syndrome) (HCC) 11/17/2020  . Nausea and vomiting 11/17/2020  . Mixed diabetic hyperlipidemia associated with type 2 diabetes mellitus (HCC) 11/17/2020  . Type 2 diabetes mellitus with hyperglycemia, with long-term current use of insulin (HCC) 11/17/2020  . Immunization due 07/26/2020  . Psychosis (HCC)   . Bipolar disorder (HCC) 04/27/2020  . Fall 02/11/2020  . Chronic diastolic CHF (congestive heart failure) (HCC) 01/22/2020  . Drug rash   . Acute osteomyelitis of pelvic region and thigh (HCC)   . Palliative care by specialist   . Goals of care, counseling/discussion   . Edema of upper extremity   . Hyperphosphatemia   . Cardiac arrest (HCC)   . Sacral wound   . Hypoxia   . Abscess of multiple sites of buttock 12/03/2019  . AF (paroxysmal atrial fibrillation) (HCC) 12/03/2019  . Acute metabolic encephalopathy 09/15/2019  . UTI (urinary tract infection), bacterial 09/15/2019  . Pressure injury of skin 09/10/2019  . Morbid obesity with BMI of 40.0-44.9, adult (HCC) 02/27/2018   PCP:  Roderic Scarce, MD Pharmacy:   [facility] [place], [place] - [address] [place] [address] Phone: [phone] Fax: [phone]  [facility] - [place], [place] - [address] [place] [address] Phone: [phone] Fax: [phone]     Social Determinants of Health (SDOH) Interventions    Readmission Risk Interventions No flowsheet data found.

## 2020-11-18 NOTE — Progress Notes (Addendum)
Care started prior to midnight in the emergency room and patient was admitted early this morning after midnight by Dr. Shauna Hugh and I am currently in agreement with his assessment and plan.  Additional changes to the plan of care been made accordingly.  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  Initially she is unable to provide a history due to being minimally responsive and most of the history was obtained by her patient's brother and the ED staff as well as review of other notes.  The patient has been exhibiting progressively worsened bouts of aggressive behavior last few days and has also had associated poor oral intake and according to the patient's facility she has not been coughing or exhibiting any shortness of breath, diarrhea nausea or vomiting.  Due to progressively worsening symptoms and altered mental status she symptoms worsen all emergency room for further evaluation.  Upon evaluation in the ED she is found to have a leukocytosis of several bouts of fever in the ED with a T-max of 101.5.  She also exhibited multiple bouts of vomiting and several loose stools and she became increasingly lethargic throughout emergency department stay.  She underwent CT imaging of the abdomen pelvis which was unrevealing and did not reveal definitive source of infection.  Urinalysis also performed and found to be  unremarkable.  Currently she is being admitted and treated for the following but not limited to:  Acute Metabolic Encephalopathy -Patient is exhibiting several days of progressively increasing agitation at the skilled nursing facility and poor oral intake.  On arrival here, patient is actually becoming increasingly more more lethargic and minimally responsive but upon my evaluation this morning she is extremely agitated and significantly confused. -CT imaging of head in the emergency department unremarkable. -Home medication list reviewed, I do not believe that polypharmacy is the cause considering the sudden onset and progression of her symptoms. -Multiple bouts of fevers in the emergency department is suggestive of an infectious etiology causing patient's change in mentation.   -CT imaging of the chest abdomen pelvis has not identified a focus of infection.  Urinalysis not suggestive of urinary tract infection.  Lactic acid unremarkable. -Considering patient's progressive clinical course there was concern about the possibility of meningitis or encephalitis.   -Home regimen of Eliquis has been held and and patient to [facility] lumbar puncture.   -In the meantime we will place patient on empiric intravenous antibiotic regimen of IV ceftriaxone vancomycin and ampicillin.   -After discussion with infectious diseases Dr. Renold Don recommends adding empiric acyclovir -I discussed the case with neurology Dr. Wilford Corner who recommended lumbar puncture under fluoroscopy guidance given that neurologist do not perform procedures at [facility]  -Obtaining TSH, folate, vitamin B12 was normal at 211, ammonia was normal at 22, hepatic function panel unremarkable, RPR, CRP and urine toxicology screen additionally ordered and are pending. -Patient will currently remain n.p.o. until mentation improves. -She continues to have significant behavioral disturbances and if  necessary will need to run her IVC and psychiatry has  been consulted for further evaluation recommendations see the patient again tomorrow but they have at this time recommending resuming Depakote 500 g p.o. twice daily for bipolar disorder and aggression and agitation resume Seroquel 250 mg p.o. nightly and continue neurological work-up at this time  SIRS (systemic inflammatory response syndrome) (HCC) with concern for sepsis in the setting of meningitis -Patient exhibiting leukocytosis and multiple fevers in the emergency department consistent with SIRS -Unclear source.  CT chest abdomen pelvis has not identified a definitive source.  Urinalysis not suggestive of urinary tract infection.   -Patient did exhibit several bouts of nausea and vomiting in the emergency department and therefore a viral gastroenteritis is possible.  Alternatively, considering patient's worsening mentation meningitis or encephalitis are also a possibility and once patient to have apixaban held for lumbar puncture -Blood cultures and urine cultures have been obtained. -Hydrating patient with intravenous isotonic fluids -Given a dose of IV Decadron 10 mg x 1 and per Neuro Recc's will continue Dexamethasone per pharmacy protocol  -WBC is 11.8, CRP is 1.1, lactic acid level is 1.5 and repeat is 1.5, procalcitonin is less than 0.10 -Treating patient empirically with intravenous antibiotic therapy in case of potential bacterial meningitis -May need formal ID consultation if she is not improving and if lumbar puncture is unrevealing as-lumbar puncture to be done  Nausea and Vomiting -Patient did exhibit several episodes of vomiting in the emergency department as well as at least one episode of loose stool. -Abdomen was questionably slightly tender on examination but she did not let me examine properly today. -Acute gastroenteritis is one possible differential for the patient's fevers which would hopefully be self-limiting if that was the case. -Continue to monitor for further  recurrence, supportive care.  AF (paroxysmal atrial fibrillation) (HCC) -Holding home regimen of Apixaban in preparation for possible lumbar puncture -Currently rate controlled -Continue Monitoring patient on telemetry  Bipolar Disorder (HCC) -Patient typically takes Seroquel and valproic acid for her bipolar disorder has not taken it in several days -Psychiatry consulted and recommending resuming this currently and she has been resumed on 500 mg p.o. Depakote twice daily as well as quetiapine to 50 mg p.o. nightly -They will formally evaluate the patient in the a.m. when she is little bit more stable  Mixed diabetic hyperlipidemia associated with type 2 diabetes mellitus (HCC) -Holding home regimen of statin therapy until patient is able to safely tolerate oral intake and SLP has   Type 2 diabetes mellitus with hyperglycemia, with long-term current use of insulin (HCC) -C/w Accu-Cheks every 6 hours with sliding scale insulin while n.p.o. -Hemoglobin A1c was 6.1 -Continue to Monitor Blood Sugars Carefully. CBG's ranging from 130-165  Constipation -There was a 8 cm stool ball noted in the rectum on CT imaging of the abdomen and pelvis incidentally. -Once patient is more awake and alert will proceed with ordering enema such as smog enema.  If that is unsuccessful then disimpaction can be attempted.  Sacral Wound -Sacral wound has substantially improved since it was a stage IV wound with concurrent osteomyelitis in 2021.  Ulceration has essentially epithelialized. -Continuing daily dressing changes, wound care consultation placed -Present on admission.  Hyponatremia -Patient's sodium level is 133  -Currently getting normal saline at 125 mL's per hour for 16 hours and will reduce this to the normal saline at 75 MLS per hour for 1 more day -Continue to monitor and trend and repeat CMP in  a.m.  Chronic Diastolic Congestive Heart Failure -No evidence of cardiogenic volume overload  at this time.   -Will monitor for any evidence of volume overload as we hydrate the patient with intravenous fluids with normal saline at 125 mL's per hour for 16 hours and will reduce this to 75 MLS per hour  We will continue to monitor patient clinical response to intervention and repeat blood work and follow-up on cultures.  If she is not improving we will formally consult ID as well as neurology.

## 2020-11-18 NOTE — Progress Notes (Signed)
PHARMACY - PHYSICIAN COMMUNICATION CRITICAL VALUE ALERT - BLOOD CULTURE IDENTIFICATION (BCID)  Becky Hendricks is an 75 y.o. female who presented to [facility] on 11/17/2020 with a chief complaint of aggressive behavior, IVC, suspected meningitis.  Assessment:  BCID + MRSA   Name of physician (or Provider) Contacted: Dr Marland Mcalpine  Current antibiotics: Vancomycin, Ceftriaxone, Ampicillin  Changes to prescribed antibiotics recommended:  Continue vancomycin.  ID to d/c Ceftriaxone and Ampicillin.   Results for orders placed or performed during the hospital encounter of 11/17/20  Blood Culture ID Panel (Reflexed) (Collected: 11/17/2020  6:45 PM)  Result Value Ref Range   Enterococcus faecalis NOT DETECTED NOT DETECTED   Enterococcus Faecium NOT DETECTED NOT DETECTED   Listeria monocytogenes NOT DETECTED NOT DETECTED   Staphylococcus species DETECTED (A) NOT DETECTED   Staphylococcus aureus (BCID) DETECTED (A) NOT DETECTED   Staphylococcus epidermidis NOT DETECTED NOT DETECTED   Staphylococcus lugdunensis NOT DETECTED NOT DETECTED   Streptococcus species NOT DETECTED NOT DETECTED   Streptococcus agalactiae NOT DETECTED NOT DETECTED   Streptococcus pneumoniae NOT DETECTED NOT DETECTED   Streptococcus pyogenes NOT DETECTED NOT DETECTED   A.calcoaceticus-baumannii NOT DETECTED NOT DETECTED   Bacteroides fragilis NOT DETECTED NOT DETECTED   Enterobacterales NOT DETECTED NOT DETECTED   Enterobacter cloacae complex NOT DETECTED NOT DETECTED   Escherichia coli NOT DETECTED NOT DETECTED   Klebsiella aerogenes NOT DETECTED NOT DETECTED   Klebsiella oxytoca NOT DETECTED NOT DETECTED   Klebsiella pneumoniae NOT DETECTED NOT DETECTED   Proteus species NOT DETECTED NOT DETECTED   Salmonella species NOT DETECTED NOT DETECTED   Serratia marcescens NOT DETECTED NOT DETECTED   Haemophilus influenzae NOT DETECTED NOT DETECTED   Neisseria meningitidis NOT DETECTED NOT DETECTED   Pseudomonas aeruginosa NOT  DETECTED NOT DETECTED   Stenotrophomonas maltophilia NOT DETECTED NOT DETECTED   Candida albicans NOT DETECTED NOT DETECTED   Candida auris NOT DETECTED NOT DETECTED   Candida glabrata NOT DETECTED NOT DETECTED   Candida krusei NOT DETECTED NOT DETECTED   Candida parapsilosis NOT DETECTED NOT DETECTED   Candida tropicalis NOT DETECTED NOT DETECTED   Cryptococcus neoformans/gattii NOT DETECTED NOT DETECTED   Meth resistant mecA/C and MREJ DETECTED (A) NOT DETECTED    Lynann Beaver PharmD, BCPS Clinical Pharmacist [facility] main pharmacy [phone] 11/18/2020 3:24 PM

## 2020-11-18 NOTE — Evaluation (Signed)
SLP Cancellation Note  Patient Details Name: Becky Hendricks MRN: [id number] DOB: [birth year]   Cancelled treatment:       Reason Eval/Treat Not Completed: Other (comment) (pt tentatively for lumbar puncture today and has to be npo)  Rolena Infante, MS [facility] SLP Acute Rehab Services Office [phone] Pager [phone]   Chales Abrahams 11/18/2020, 10:50 AM

## 2020-11-18 NOTE — H&P (Addendum)
History and Physical    Becky Hendricks EXH:[id number] DOB: 07-16-1946 DOA: 11/17/2020  PCP: Roderic Scarce, MD  Patient coming from: SNF [facility])   Chief Complaint:  Chief Complaint  Patient presents with  . ivc  . 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  Patient is unable to provide a history at this time due to being minimally responsive.  Majority of history has been obtained from the brother via phone conversation, discussion with the emergency department staff and review of emergency department notes.  Patient has been exhibiting progressively worsening bouts of aggressive behavior over the past several days.  This is been associated with poor oral intake.  According to patient's facility patient has not been complaining of pain, has not been coughing, exhibiting shortness of breath, exhibiting diarrhea nausea vomiting or fevers.  Due to patient's progressively worsening symptoms of altered mental status patient was eventually sent to [facility] emergency department for evaluation.  Upon evaluation in the emergency department patient was found to have a leukocytosis of 12 with several bouts of fever in the emergency department with T-max of 101.5 F.  Patient also exhibited multiple bouts of vomiting and several loose stools.  Patient has become increasingly lethargic throughout the emergency department stay.  Patient underwent CT imaging of the chest abdomen and pelvis which did not reveal a definitive source of  infection.  Urinalysis was also performed and found to be unremarkable.  The hospitalist group has been called to assess the patient for admission the hospital.  Review of Systems:   Review of Systems  Unable to perform ROS: Mental status change    Past Medical History:  Diagnosis Date  . Atrial fibrillation (HCC)   . Bipolar disorder (HCC)   . Bipolar disorder (HCC) 04/27/2020  . Clostridium difficile diarrhea 08/02/2019  . Dehydration   . Essential hypertension   . Fall 02/11/2020  . Morbid obesity (HCC)   . Osteomyelitis (HCC) 12/03/2019  . Pneumonia due to COVID-19 virus 09/15/2019  . Type 2 diabetes mellitus (HCC)   . Weakness     Past Surgical History:  Procedure Laterality Date  . INCISION AND DRAINAGE PERIRECTAL ABSCESS Left 12/04/2019   Procedure: IRRIGATION AND DEBRIDEMENT LEFT BUTTOCK ABSCESS;  Surgeon: Violeta Gelinas, MD;  Location: [facility] OR;  Service: General;  Laterality: Left;  . INCISION AND DRAINAGE PERIRECTAL ABSCESS Left 12/10/2019   Procedure: REPEAT IRRIGATION AND DEBRIDEMENT BUTTOCK  ABSCESS;  Surgeon: Abigail Miyamoto, MD;  Location: [facility] OR;  Service: General;  Laterality: Left;     reports that she has never smoked. She has never used smokeless tobacco. She reports previous alcohol use. She reports previous drug use.  Allergies  Allergen Reactions  . Chlorhexidine Gluconate Itching  . Acetazolamide Er Rash    Family History  Family history unknown: Yes     Prior to Admission medications   Medication Sig Start Date End Date Taking? Authorizing Provider  Amino Acids-Protein Hydrolys (FEEDING SUPPLEMENT, PRO-STAT SUGAR FREE 64,) LIQD Take 30 mLs by mouth 2 (two) times daily. 01/05/20  Yes Regalado, Prentiss Bells, MD  apixaban (ELIQUIS) 5 MG TABS tablet Take 5 mg by mouth 2 (two) times daily.   Yes [provider]  atorvastatin (LIPITOR) 10 MG tablet Take 10 mg by mouth daily.   Yes [provider]  calcium carbonate (TUMS - DOSED IN MG  ELEMENTAL CALCIUM) 500 MG chewable tablet Chew 500 mg by mouth daily.   Yes [provider]  carvedilol (COREG) 3.125 MG tablet Take 3.125 mg by mouth 2 (two) times daily. 11/01/20  Yes [provider]  Cholecalciferol (VITAMIN D) 125 MCG (5000 UT) CAPS Take 10,000 Units by mouth once a week. Wednesday   Yes [provider]  cloNIDine HCl (KAPVAY) 0.1 MG TB12 ER tablet Take 0.1 mg by mouth 2 (two) times daily. 11/02/20  Yes [provider]  diphenhydrAMINE (BENADRYL) 2 % cream Apply 1 application topically 3 (three) times daily as needed for itching.   Yes [provider]  divalproex (DEPAKOTE) 500 MG DR tablet Take 500 mg by mouth 2 (two) times daily.   Yes [provider]  docusate sodium (COLACE) 100 MG capsule Take 1 capsule (100 mg total) by mouth 2 (two) times daily as needed for mild constipation. 01/05/20  Yes Regalado, Belkys A, MD  furosemide (LASIX) 20 MG tablet Take 20 mg by mouth every other day. 11/07/20  Yes [provider]  gabapentin (NEURONTIN) 100 MG capsule Take 100 mg by mouth 2 (two) times daily. 11/01/20  Yes [provider]  insulin glargine (LANTUS) 100 UNIT/ML injection Inject 4 Units into the skin at bedtime. Notify PEC for CBG less than 100 or greater than 250.   Yes [provider]  Ipratropium-Albuterol (COMBIVENT RESPIMAT) 20-100 MCG/ACT AERS respimat Inhale 1 puff into the lungs every 12 (twelve) hours.   Yes [provider]  losartan (COZAAR) 25 MG tablet Take 25 mg by mouth daily.   Yes [provider]  Multiple Vitamin (MULTIVITAMIN WITH MINERALS) TABS tablet Take 1 tablet by mouth daily.   Yes [provider]  pantoprazole (PROTONIX) 40 MG tablet Take 1 tablet (40 mg total) by mouth 2 (two) times daily. Patient taking differently: Take 40 mg by mouth daily. 01/05/20  Yes Regalado, Belkys A, MD  QUEtiapine (SEROQUEL) 50 MG tablet Take 50 mg by mouth at bedtime. Take  with  10/28/20  Yes [provider]  sennosides-docusate sodium (SENOKOT-S) 8.6-50 MG tablet Take 1 tablet by mouth daily.   Yes [provider]  SEROQUEL 100 MG tablet Take 200 mg by mouth at bedtime. Take with  11/07/20  Yes [provider]  sitaGLIPtin-metformin (JANUMET) 50-1000 MG tablet Take 1 tablet by mouth 2 (two) times daily with a meal.   Yes [provider]  Ensure Max Protein (ENSURE MAX PROTEIN) LIQD Take 330 mLs (11 oz total) by mouth 2 (two) times daily. Patient not taking: Reported on 11/17/2020 01/27/20   Lonia Blood, MD    Physical Exam: Vitals:   11/17/20 1813 11/17/20 1824 11/17/20 1915 11/17/20 2255  BP:   (!) 158/88   Pulse:   91   Resp:   (!) 25   Temp: 100.1 F (37.8 C) (!) 100.4 F (38 C) (!) 101.5 F (38.6 C) (!) 101.1 F (38.4 C)  TempSrc: Oral Rectal Axillary Axillary  SpO2:   92%     Constitutional: Minimally responsive and minimally verbal.  Patient is not in any acute distress.  Skin: no rashes, no lesions, notable poor skin turgor. Eyes: Pupils are  equally reactive to light.  No evidence of scleral icterus or conjunctival pallor.  ENMT: Dry mucous membranes noted.  Posterior pharynx clear of any exudate or lesions.   Neck: normal, no evidence of nuchal rigidity, no masses, no thyromegaly.  No evidence of jugular venous distension.   Respiratory: clear to auscultation bilaterally, no wheezing, no crackles. Normal respiratory effort. No accessory muscle use.  Cardiovascular: Regular rate and rhythm, no murmurs / rubs / gallops. No extremity edema. 2+ pedal pulses. No carotid bruits.  Chest:   Nontender without crepitus or deformity.   Back:   Nontender without crepitus or deformity. Abdomen: Mild transient lower abdominal tenderness noted.  Abdomen is soft however.   No evidence of intra-abdominal masses.  Positive bowel sounds noted in all quadrants.   Musculoskeletal: Patient exhibits increased muscle  tone.  No joint deformity upper and lower extremities. Good ROM, no contractures. Normal muscle tone.  Neurologic: No evidence of clonus on examination.  Patient is extremely lethargic and minimally responsive at this time.  Patient does respond to verbal and painful stimuli however.  Patient does not follow commands.  Patient does localize to pain.   Psychiatric: Unable to assess due to significant lethargy.  Patient currently does not seem to possess insight as to her current situation.     Labs on Admission: I have personally reviewed following labs and imaging studies -   CBC: Recent Labs  Lab 11/17/20 1600  WBC 12.0*  HGB 13.4  HCT 42.0  MCV 89.6  PLT 248   Basic Metabolic Panel: Recent Labs  Lab 11/17/20 1600  NA 135  K 3.9  CL 97*  CO2 26  GLUCOSE 152*  BUN 17  CREATININE 0.51  CALCIUM 9.4   GFR: CrCl cannot be calculated (Unknown ideal weight.). Liver Function Tests: Recent Labs  Lab 11/17/20 1600  AST 13*  ALT 15  ALKPHOS 54  BILITOT 0.9  PROT 7.6  ALBUMIN 3.7   No results for input(s): LIPASE, AMYLASE in the last 168 hours. No results for input(s): AMMONIA in the last 168 hours. Coagulation Profile: No results for input(s): INR, PROTIME in the last 168 hours. Cardiac Enzymes: No results for input(s): CKTOTAL, CKMB, CKMBINDEX, TROPONINI in the last 168 hours. BNP (last 3 results) No results for input(s): PROBNP in the last 8760 hours. HbA1C: No results for input(s): HGBA1C in the last 72 hours. CBG: Recent Labs  Lab 11/18/20 0046  GLUCAP 130*   Lipid Profile: No results for input(s): CHOL, HDL, LDLCALC, TRIG, CHOLHDL, LDLDIRECT in the last 72 hours. Thyroid Function Tests: No results for input(s): TSH, T4TOTAL, FREET4, T3FREE, THYROIDAB in the last 72 hours. Anemia Panel: No results for input(s): VITAMINB12, FOLATE, FERRITIN, TIBC, IRON, RETICCTPCT in the last 72 hours. Urine analysis:    Component Value Date/Time   COLORURINE STRAW (A)  11/17/2020 1714   APPEARANCEUR CLEAR 11/17/2020 1714   LABSPEC 1.013 11/17/2020 1714   PHURINE 8.0 11/17/2020 1714   GLUCOSEU 50 (A) 11/17/2020 1714   HGBUR NEGATIVE 11/17/2020 1714   BILIRUBINUR NEGATIVE 11/17/2020 1714   KETONESUR 20 (A) 11/17/2020 1714   PROTEINUR NEGATIVE 11/17/2020 1714   NITRITE NEGATIVE 11/17/2020 1714   LEUKOCYTESUR NEGATIVE 11/17/2020 1714    Radiological Exams on Admission - Personally Reviewed: CT HEAD WO CONTRAST  Result Date: 11/17/2020 CLINICAL DATA:  Altered mental status. EXAM: CT HEAD WITHOUT CONTRAST TECHNIQUE: Contiguous axial images were obtained from the base of the skull through the vertex without intravenous contrast. COMPARISON:  May 05, 2020 FINDINGS: Brain: There is mild cerebral atrophy with widening of the extra-axial spaces and ventricular dilatation. There are areas of decreased attenuation within the white matter tracts of the supratentorial brain, consistent with microvascular disease changes. Vascular: No hyperdense vessel or unexpected calcification. Skull: Normal. Negative for fracture or focal lesion. Sinuses/Orbits: Mild bilateral ethmoid sinus mucosal thickening is seen. Other: The study is limited secondary to patient motion. IMPRESSION: 1. Generalized cerebral atrophy. 2. No acute intracranial abnormality. Electronically Signed   By: Aram Candelahaddeus  Houston M.D.   On: 11/17/2020 17:17   CT Chest W Contrast  Result Date: 11/17/2020 CLINICAL DATA:  Fever, altered mental status.  Nausea and vomiting. EXAM: CT CHEST, ABDOMEN, AND PELVIS WITH CONTRAST TECHNIQUE: Multidetector CT imaging of the chest, abdomen and pelvis was performed following the standard protocol during bolus administration of intravenous contrast. CONTRAST:  100mL OMNIPAQUE IOHEXOL 300 MG/ML  SOLN COMPARISON:  CT abdomen pelvis 12/20/2019, CT angiography chest 12/24/2019 FINDINGS: CT CHEST FINDINGS Cardiovascular: Normal heart size. No significant pericardial effusion. The  thoracic aorta is normal in caliber. Mild atherosclerotic plaque of the thoracic aorta. The main pulmonary artery measures at the upper limits of normal: 3.2 cm. Mediastinum/Nodes: No enlarged mediastinal, hilar, or axillary lymph nodes. Thyroid gland, trachea, and esophagus demonstrate no significant findings. Lungs/Pleura: Right lower lobe atelectasis with persistent elevated right hemidiaphragm. Limited evaluation for pulmonary nodule due to respiratory motion artifact. No pulmonary mass. No focal consolidation. No pleural effusion. No pneumothorax. Musculoskeletal: No chest wall abnormality. No suspicious lytic or blastic osseous lesions. No acute displaced fracture. Multilevel degenerative changes of the spine. CT ABDOMEN PELVIS FINDINGS Hepatobiliary: No focal liver abnormality. Gallstone noted within the gallbladder lumen. No gallbladder wall thickening or pericholecystic fluid. No biliary dilatation. Pancreas: No focal lesion. Normal pancreatic contour. No surrounding inflammatory changes. No main pancreatic ductal dilatation. Spleen: Normal in size without focal abnormality. Adrenals/Urinary Tract: No adrenal nodule bilaterally. Bilateral kidneys enhance symmetrically. Exophytic hypodense 2.2 cm lesion within the right kidney likely represent simple renal cysts. Slightly increased density likely due to streak artifact at this level. No hydronephrosis. No hydroureter. The urinary bladder is unremarkable. On delayed imaging, there is no urothelial wall thickening and there are no filling defects in the opacified portions of the bilateral collecting systems or ureters. Stomach/Bowel: Stomach is within normal limits. No evidence of bowel wall thickening or dilatation. 8 cm stool ball within the rectum. The appendix not definitely identified. Vascular/Lymphatic: No abdominal aorta or iliac aneurysm. Mild atherosclerotic plaque of the aorta and its branches. No abdominal, pelvic, or inguinal lymphadenopathy.  Reproductive: Difficult to measure grossly stable uterine fibroids. No adnexal lesion. Other: No intraperitoneal free fluid. No intraperitoneal free gas. No organized fluid collection. Musculoskeletal: Tiny fat containing left inguinal hernia. No suspicious lytic or blastic osseous lesions. No acute displaced fracture. Multilevel degenerative changes of the spine. IMPRESSION: 1. Main pulmonary artery measures at the upper limits of normal. Correlate with pulmonary hypertension. 2. Otherwise no acute intrathoracic abnormality. 3. Cholelithiasis with no CT findings of acute cholecystitis. 4. An 8 cm stool ball within the rectum with no associated findings suggest of stercoral colitis. 5. 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 Electronically Signed   By: Tish FredericksonMorgane  Naveau M.D.   On: 11/17/2020 20:07   CT Abdomen Pelvis W Contrast  Result Date: 11/17/2020 CLINICAL DATA:  Fever, altered mental status.  Nausea and vomiting. EXAM: CT CHEST, ABDOMEN, AND PELVIS WITH CONTRAST TECHNIQUE: Multidetector CT imaging of the chest, abdomen and  pelvis was performed following the standard protocol during bolus administration of intravenous contrast. CONTRAST:  OMNIPAQUE IOHEXOL 300 MG/ML  SOLN COMPARISON:  CT abdomen pelvis 12/20/2019, CT angiography chest 12/24/2019 FINDINGS: CT CHEST FINDINGS Cardiovascular: Normal heart size. No significant pericardial effusion. The thoracic aorta is normal in caliber. Mild atherosclerotic plaque of the thoracic aorta. The main pulmonary artery measures at the upper limits of normal: 3.2 cm. Mediastinum/Nodes: No enlarged mediastinal, hilar, or axillary lymph nodes. Thyroid gland, trachea, and esophagus demonstrate no significant findings. Lungs/Pleura: Right lower lobe atelectasis with persistent elevated right hemidiaphragm. Limited evaluation for pulmonary nodule due to respiratory motion artifact. No pulmonary mass. No focal consolidation. No pleural effusion. No pneumothorax.  Musculoskeletal: No chest wall abnormality. No suspicious lytic or blastic osseous lesions. No acute displaced fracture. Multilevel degenerative changes of the spine. CT ABDOMEN PELVIS FINDINGS Hepatobiliary: No focal liver abnormality. Gallstone noted within the gallbladder lumen. No gallbladder wall thickening or pericholecystic fluid. No biliary dilatation. Pancreas: No focal lesion. Normal pancreatic contour. No surrounding inflammatory changes. No main pancreatic ductal dilatation. Spleen: Normal in size without focal abnormality. Adrenals/Urinary Tract: No adrenal nodule bilaterally. Bilateral kidneys enhance symmetrically. Exophytic hypodense 2.2 cm lesion within the right kidney likely represent simple renal cysts. Slightly increased density likely due to streak artifact at this level. No hydronephrosis. No hydroureter. The urinary bladder is unremarkable. On delayed imaging, there is no urothelial wall thickening and there are no filling defects in the opacified portions of the bilateral collecting systems or ureters. Stomach/Bowel: Stomach is within normal limits. No evidence of bowel wall thickening or dilatation. 8 cm stool ball within the rectum. The appendix not definitely identified. Vascular/Lymphatic: No abdominal aorta or iliac aneurysm. Mild atherosclerotic plaque of the aorta and its branches. No abdominal, pelvic, or inguinal lymphadenopathy. Reproductive: Difficult to measure grossly stable uterine fibroids. No adnexal lesion. Other: No intraperitoneal free fluid. No intraperitoneal free gas. No organized fluid collection. Musculoskeletal: Tiny fat containing left inguinal hernia. No suspicious lytic or blastic osseous lesions. No acute displaced fracture. Multilevel degenerative changes of the spine. IMPRESSION: 1. Main pulmonary artery measures at the upper limits of normal. Correlate with pulmonary hypertension. 2. Otherwise no acute intrathoracic abnormality. 3. Cholelithiasis with no CT  findings of acute cholecystitis. 4. An 8 cm stool ball within the rectum with no associated findings suggest of stercoral colitis. 5. 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 Electronically Signed   By: Tish Frederickson M.D.   On: 11/17/2020 20:07    Telemetry: Personally reviewed.  Normal sinus rhythm.  Assessment/Plan Principal Problem:   Acute metabolic encephalopathy   Patient is exhibiting several days of progressively increasing agitation at the skilled nursing facility and poor oral intake.  On arrival here, patient is actually becoming increasingly more more lethargic and minimally responsive.  CT imaging of head in the emergency department unremarkable.  Home medication list reviewed, I do not believe that polypharmacy is the cause considering the sudden onset and progression of her symptoms.  Multiple bouts of fevers in the emergency department is suggestive of an infectious etiology causing patient's change in mentation.    CT imaging of the chest abdomen pelvis has not identified a focus of infection.  Urinalysis not suggestive of urinary tract infection.  Lactic acid unremarkable.  Considering patient's progressive clinical course I am additionally concerned about the possibility of meningitis or encephalitis.  Home regimen of Eliquis has been held and will need to be held for 24 hours prior to proceeding  with lumbar puncture.  In the meantime we will place patient on empiric intravenous antibiotic regimen ceftriaxone vancomycin and ampicillin.  Case can be discussed with neurology tomorrow morning for attempted lumbar puncture followed by discussion with interventional radiology tomorrow if that is unsuccessful.  TSH, folate, vitamin B12, ammonia, hepatic function panel, RPR, CRP and urine toxicology screen additionally ordered and are pending.  Patient will currently remain n.p.o. until mentation improves.  Active Problems:   SIRS (systemic inflammatory response syndrome)  (HCC)   Patient exhibiting leukocytosis and multiple fevers in the emergency department consistent with SIRS  Unclear source.  CT chest abdomen pelvis has not identified a definitive source.  Urinalysis not suggestive of urinary tract infection.    Patient did exhibit several bouts of nausea and vomiting in the emergency department and therefore a viral gastroenteritis is possible.  Alternatively, considering patient's worsening mentation meningitis or encephalitis are also a possibility and once patient has not taken Eliquis in over 24 hours a lumbar puncture can be attempted.  In the meantime, blood cultures and urine cultures have been obtained.  Hydrating patient with intravenous isotonic fluids  Treating patient empirically with intravenous antibiotic therapy in case of potential bacterial meningitis    Nausea and vomiting   Patient did exhibit several episodes of vomiting in the emergency department as well as at least one episode of loose stool.  Abdomen was questionably slightly tender on examination.  Acute gastroenteritis is one possible differential for the patient's fevers which would hopefully be self-limiting if that was the case.  Continue to monitor for further recurrence, supportive care.    AF (paroxysmal atrial fibrillation) (HCC)   Holding home regimen of Eliquis in preparation for possible lumbar puncture  Currently rate controlled  Monitoring patient on telemetry    Bipolar disorder [facility])   Patient typically takes Seroquel and valproic acid for her bipolar disorder has not taken it in several days  We will resume once patient is more awake and alert and able to tolerate oral intake    Mixed diabetic hyperlipidemia associated with type 2 diabetes mellitus (HCC)   Holding home regimen of statin therapy until patient is able to safely tolerate oral intake.    Type 2 diabetes mellitus with hyperglycemia, with long-term current use of insulin  (HCC)   Accu-Cheks every 6 hours with sliding scale insulin while n.p.o.  Hemoglobin A1c pending.    Constipation   8 cm stool ball noted in the rectum on CT imaging of the abdomen and pelvis incidentally.  Once patient is more awake and alert will proceed with ordering enema such as smog enema.  If that is unsuccessful then disimpaction can be attempted.    Sacral wound   Sacral wound has substantially improved since it was a stage IV wound with concurrent osteomyelitis in 2021.  Ulceration has essentially epithelialized.  Continuing daily dressing changes, wound care consultation placed  Present on admission.  Chronic diastolic congestive heart failure   No evidence of cardiogenic volume overload at this time.  Will monitor for any evidence of volume overload as we hydrate the patient with intravenous fluids.   Code Status:  Full code Family Communication: Brother was contacted via phone conversation and has been updated on plan of care.  Status is: Observation  The patient remains OBS appropriate and will d/c before 2 midnights.  Dispo: The patient is from: SNF              Anticipated d/c is to:  SNF              Patient currently is not medically stable to d/c.   Difficult to place patient No        Marinda Elk MD [facility] Pager [phone]  If 7PM-7AM, please contact night-coverage [email or web address] Use universal [place] password for that web site. If you do not have the password, please call the hospital operator.  11/18/2020, 12:48 AM

## 2020-11-18 NOTE — Consult Note (Signed)
[facility]    Date of Admission:  11/17/2020     Reason for Consult: ams, sepsis, mrsa bacteremia    Referring Provider: Marland Mcalpine   CC: Ams, sepsis, mrsa bacteremia, hx sacral decub   Lines:  Peripheral iv  Abx: 4/07-c vanc 4/07-c ceftriaxone 4/07-c amp         Assessment: 75 y.o. 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 Sx of several days from community so defined complicated  Will need to r/o endocarditis  Will also need to r/o other infectious complication when she is more alert  Plan: 1. Repeat blood culture 2. Agree with tte; if negative, will need tee 3. Monitor for focal bone/joint pain and will need imaging if such present 4. Continue vancomycin 5. Can stop ceftriaxone/ampicillin 6. Duration of abx at least 4 weeks, but will depend on further clinical evaluation    I spent 60 minute reviewing data/chart, and coordinating care and >50% direct face to face time providing counseling/discussing diagnostics/treatment plan with patient   ------------------------------------------------ Principal Problem:   Acute metabolic encephalopathy Active Problems:   AF (paroxysmal atrial fibrillation) (HCC)   Sacral wound   Chronic diastolic CHF (congestive heart failure) (HCC)   Bipolar disorder (HCC)   SIRS (systemic inflammatory response syndrome) (HCC)   Nausea and vomiting   Mixed diabetic hyperlipidemia associated with type 2 diabetes mellitus (HCC)   Type 2 diabetes mellitus with hyperglycemia, with long-term current use of insulin  (HCC)   Constipation   Encephalopathy acute    HPI: Becky Hendricks is a 75 y.o. 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 There was an mri ordered by wound doctor that showed persistent evidence sacral OM but otherwise no clinical evidence active OM otherwise. She was advised to continue wound care. Also unclear why patient developed diffuse weakness unable to get around so advised her to see neurology for cidp w/u  The history taking was difficult due to patient's being altered  Patient had a few days of behavior change and decreased PO intake. There was no noted cough/sob/n/v/diarrhea at the nursing home. No fever was reported. On the day of admission patient was unable to interact  Here her admitting vitals show fever to 101.5, initial wbc 12. She had a pan-body ct scan which showed no definitive source of infection. Vomiting and loose stools noted in the ed  Blood cx ultimately showed mrsa. She was started on vanc/ceftriaxone/ampicillin for presumed meningitis  As bcx returns with mrsa (bcid) now, ID is seeing her   Her sacral ulcer had closed I reviewed wound care team note      Family History  Family history unknown: Yes    Social History   Tobacco Use  .  Smoking status: Never Smoker  . Smokeless tobacco: Never Used  Vaping Use  . Vaping Use: Unknown  Substance Use Topics  . Alcohol use: Not Currently  . Drug use: Not Currently    Allergies  Allergen Reactions  . Chlorhexidine Gluconate Itching  . Acetazolamide Er Rash    Review of Systems: ROS All Other ROS was negative, except mentioned above   Past Medical History:   Diagnosis Date  . Atrial fibrillation (HCC)   . Bipolar disorder (HCC)   . Bipolar disorder (HCC) 04/27/2020  . Clostridium difficile diarrhea 08/02/2019  . Dehydration   . Essential hypertension   . Fall 02/11/2020  . Morbid obesity (HCC)   . Osteomyelitis (HCC) 12/03/2019  . Pneumonia due to COVID-19 virus 09/15/2019  . Type 2 diabetes mellitus (HCC)   . Weakness        Scheduled Meds: . [START ON 11/19/2020] divalproex  500 mg Oral BID  . insulin aspart  0-15 Units Subcutaneous Q6H  . QUEtiapine  200 mg Oral QHS  . QUEtiapine  50 mg Oral QHS   Continuous Infusions: . sodium chloride    . ampicillin (OMNIPEN) IV 2 g (11/18/20 1246)  . cefTRIAXone (ROCEPHIN)  IV 2 g (11/18/20 1022)  . magnesium sulfate bolus IVPB    . vancomycin     PRN Meds:.acetaminophen **OR** acetaminophen, ondansetron **OR** ondansetron (ZOFRAN) IV   OBJECTIVE: Blood pressure 106/64, pulse 68, temperature 97.6 F (36.4 C), temperature source Oral, resp. rate 18, SpO2 92 %.  Physical Exam Sleeping; post LP; not arousable Heent: normocephalic; per; conj clear Neck supple cv rrr no mrg Lungs clear; normal respiratory effort abd s/nt Ext no edema Skin no rash; reviewed wound care team's exam on her prevous sacral ulcer --> no longer present Neuro deferred as sleeping not arousable Psych deferred. Discussed with sitter at bedside who endorsed patient was talking but words salad without concrete thought content; not comatose  Lab Results Lab Results  Component Value Date   WBC 11.8 (H) 11/18/2020   HGB 12.7 11/18/2020   HCT 39.7 11/18/2020   MCV 89.8 11/18/2020   PLT 227 11/18/2020    Lab Results  Component Value Date   CREATININE 0.69 11/18/2020   BUN 18 11/18/2020   NA 133 (L) 11/18/2020   K 4.1 11/18/2020   CL 96 (L) 11/18/2020   CO2 27 11/18/2020    Lab Results  Component Value Date   ALT 13 11/18/2020   AST 13 (L) 11/18/2020   ALKPHOS 43 11/18/2020   BILITOT 0.6 11/18/2020       Microbiology: Recent Results (from the past 240 hour(s))  Culture, blood (routine x 2)     Status: None (Preliminary result)   Collection Time: 11/17/20  6:42 PM   Specimen: BLOOD  Result Value Ref Range Status   Specimen Description   Final    BLOOD LEFT ANTECUBITAL Performed at [facility], [address] [place], [place] [address]    Special Requests   Final    BOTTLES DRAWN AEROBIC AND ANAEROBIC Blood Culture adequate volume Performed at [facility], [address] [place], [place] [address]    Culture   Final    NO GROWTH < 12 HOURS Performed at [facility], [address] [place], [place] [address]    Report Status PENDING  Incomplete  Culture, blood (routine x 2)     Status: None (Preliminary result)   Collection Time: 11/17/20  6:45 PM   Specimen: BLOOD LEFT FOREARM  Result Value Ref Range Status   Specimen Description   Final    BLOOD LEFT FOREARM Performed at [facility], [address] [place], [place] [address]    Special Requests   Final    BOTTLES DRAWN AEROBIC AND ANAEROBIC Blood Culture adequate volume Performed at [facility], [address], [place], [place] [address]    Culture  Setup Time   Final    GRAM POSITIVE COCCI IN CLUSTERS AEROBIC BOTTLE ONLY CRITICAL RESULT CALLED TO, READ BACK BY AND VERIFIED WITH: PHARMD CHRISTINE SHADE ON 11/18/20 AT 1508 BY KJ Performed at [facility], [address], [place], [place] [address]    Culture GRAM POSITIVE COCCI IN CLUSTERS  Final   Report Status PENDING  Incomplete  Blood Culture ID Panel (Reflexed)     Status: Abnormal   Collection Time: 11/17/20  6:45 PM  Result Value Ref Range Status   Enterococcus faecalis NOT DETECTED NOT DETECTED Final   Enterococcus Faecium NOT DETECTED NOT DETECTED Final   Listeria monocytogenes NOT DETECTED NOT DETECTED Final   Staphylococcus species DETECTED (A) NOT DETECTED  Final    Comment: CRITICAL RESULT CALLED TO, READ BACK BY AND VERIFIED WITH: PHARMD CHRISTINE SHADE ON 11/18/20 AT 1508 BY KJ    Staphylococcus aureus (BCID) DETECTED (A) NOT DETECTED Final    Comment: Methicillin (oxacillin)-resistant Staphylococcus aureus (MRSA). MRSA is predictably resistant to beta-lactam antibiotics (except ceftaroline). Preferred therapy is vancomycin unless clinically contraindicated. Patient requires contact precautions if  hospitalized. CRITICAL RESULT CALLED TO, READ BACK BY AND VERIFIED WITH: PHARMD CHRISTINE SHADE ON 11/18/20 AT 1508 BY KJ    Staphylococcus epidermidis NOT DETECTED NOT DETECTED Final   Staphylococcus lugdunensis NOT DETECTED NOT DETECTED Final   Streptococcus species NOT DETECTED NOT DETECTED Final   Streptococcus agalactiae NOT DETECTED NOT DETECTED Final   Streptococcus pneumoniae NOT DETECTED NOT DETECTED Final   Streptococcus pyogenes NOT DETECTED NOT DETECTED Final   A.calcoaceticus-baumannii NOT DETECTED NOT DETECTED Final   Bacteroides fragilis NOT DETECTED NOT DETECTED Final   Enterobacterales NOT DETECTED NOT DETECTED Final   Enterobacter cloacae complex NOT DETECTED NOT DETECTED Final   Escherichia coli NOT DETECTED NOT DETECTED Final   Klebsiella aerogenes NOT DETECTED NOT DETECTED Final   Klebsiella oxytoca NOT DETECTED NOT DETECTED Final   Klebsiella pneumoniae NOT DETECTED NOT DETECTED Final   Proteus species NOT DETECTED NOT DETECTED Final   Salmonella species NOT DETECTED NOT DETECTED Final   Serratia marcescens NOT DETECTED NOT DETECTED Final   Haemophilus influenzae NOT DETECTED NOT DETECTED Final   Neisseria meningitidis NOT DETECTED NOT DETECTED Final   Pseudomonas aeruginosa NOT DETECTED NOT DETECTED Final   Stenotrophomonas maltophilia NOT DETECTED NOT DETECTED Final   Candida albicans NOT DETECTED NOT DETECTED Final   Candida auris NOT DETECTED NOT DETECTED Final   Candida glabrata NOT DETECTED NOT DETECTED Final    Candida krusei NOT DETECTED NOT DETECTED Final   Candida parapsilosis NOT DETECTED NOT DETECTED Final   Candida tropicalis NOT DETECTED NOT DETECTED Final   Cryptococcus neoformans/gattii NOT DETECTED NOT DETECTED Final   Meth resistant mecA/C and MREJ DETECTED (A) NOT DETECTED Final    Comment: CRITICAL RESULT CALLED TO, READ BACK BY AND VERIFIED WITH: PHARMD CHRISTINE SHADE ON 11/18/20 AT 1508 BY KJ Performed at [facility], [address], [place], [place] [address]   Resp Panel by RT-PCR (Flu  A&B, Covid) Nasopharyngeal Swab     Status: None   Collection Time: 11/17/20  7:12 PM   Specimen: Nasopharyngeal Swab; Nasopharyngeal(NP) swabs in vial transport medium  Result Value Ref Range Status   SARS Coronavirus 2 by RT PCR NEGATIVE NEGATIVE Final    Comment: (NOTE) SARS-CoV-2 target nucleic acids are NOT DETECTED.  The SARS-CoV-2 RNA is generally detectable in upper respiratory specimens during the acute phase of infection. The lowest concentration of SARS-CoV-2 viral copies this assay can detect is 138 copies/mL. A negative result does not preclude SARS-Cov-2 infection and should not be used as the sole basis for treatment or other patient management decisions. A negative result may occur with  improper specimen collection/handling, submission of specimen other than nasopharyngeal swab, presence of viral mutation(s) within the areas targeted by this assay, and inadequate number of viral copies(<138 copies/mL). A negative result must be combined with clinical observations, patient history, and epidemiological information. The expected result is Negative.  Fact Sheet for Patients:  BloggerCourse.com  Fact Sheet for Healthcare Providers:  SeriousBroker.it  This test is no t yet approved or cleared by the [place] FDA and  has been authorized for detection and/or diagnosis of SARS-CoV-2 by FDA under an Emergency Use  Authorization (EUA). This EUA will remain  in effect (meaning this test can be used) for the duration of the COVID-19 declaration under Section 564(b)(1) of the Act, 21 U.S.C.section 360bbb-3(b)(1), unless the authorization is terminated  or revoked sooner.       Influenza A by PCR NEGATIVE NEGATIVE Final   Influenza B by PCR NEGATIVE NEGATIVE Final    Comment: (NOTE) The Xpert Xpress SARS-CoV-2/FLU/RSV plus assay is intended as an aid in the diagnosis of influenza from Nasopharyngeal swab specimens and should not be used as a sole basis for treatment. Nasal washings and aspirates are unacceptable for Xpert Xpress SARS-CoV-2/FLU/RSV testing.  Fact Sheet for Patients: BloggerCourse.com  Fact Sheet for Healthcare Providers: SeriousBroker.it  This test is not yet approved or cleared by the [place] FDA and has been authorized for detection and/or diagnosis of SARS-CoV-2 by FDA under an Emergency Use Authorization (EUA). This EUA will remain in effect (meaning this test can be used) for the duration of the COVID-19 declaration under Section 564(b)(1) of the Act, 21 U.S.C. section 360bbb-3(b)(1), unless the authorization is terminated or revoked.  Performed at [facility], [address] [place], [place] [address]      Serology: 4/08 rpr nonreactive   Imaging: If present, new imagings (plain films, ct scans, and mri) have been personally visualized and interpreted; radiology reports have been reviewed. Decision making incorporated into the Impression / Recommendations.  4/07 ct cgest/abd/pelv with contrast 1. Main pulmonary artery measures at the upper limits of normal. Correlate with pulmonary hypertension. 2. Otherwise no acute intrathoracic abnormality. 3. Cholelithiasis with no CT findings of acute cholecystitis. 4. An 8 cm stool ball within the rectum with no associated findings suggest of  stercoral colitis. 5. 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   4/07 ct head non-con 1. Generalized cerebral atrophy. 2. No acute intracranial abnormality.    Raymondo Band, MD [facility] [phone] pager    11/18/2020, 3:20 PM

## 2020-11-18 NOTE — Progress Notes (Signed)
Pt keeps taking O2 off. She is compliant at times with putting it back on. O2 level was between 91%-93% on room air.

## 2020-11-18 NOTE — Consult Note (Signed)
  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  Patient is unable to provide a history at this time due to being minimally responsive.  Majority of history has been obtained from the brother via phone conversation, discussion with the emergency department staff and review of emergency department notes.  Patient has been exhibiting progressively worsening bouts of aggressive behavior over the past several days.  This is been associated with poor oral intake.  According to patient's facility patient has not been complaining of pain, has not been coughing, exhibiting shortness of breath, exhibiting diarrhea nausea vomiting or fevers.  Upon evaluation in the emergency department patient was found to have a leukocytosis of 12 with several bouts of fever in the emergency department with T-max of 101.5 F.  Patient also exhibited multiple bouts of vomiting and several loose stools.  Patient has become increasingly lethargic throughout the emergency department stay.  Patient underwent CT imaging of the chest abdomen and pelvis which did not reveal a definitive source of infection.  Urinalysis was also performed and found to be unremarkable.  The hospitalist group has been called to assess the patient for admission the hospital.  Psych consult for questionable IVC criteria and medication for behaviors. Patient unable to participate in psychiatric evaluation at this time, as she is disoriented and continuing to exhibit altered mental status. Team from IR was waiting for patient outside, and she  had received Ativan for sedation for her upcoming LP. Chart review indicates patient has not exhibited any disruptive behaviors, agitation, or aggression during this brief hospitalization.  Suspect patient has encephalopathy vs delirium as it relates to her underlying infection. Patient is currently scheduled Depakote 500mg  po BID (Depakote level < 10) on admission, and Seroquel 250mg  po qhs. There remains questions if she has received her medications in the LTAC, due to her poor oral intake.   -WIll resume home medications at this time. Resume Depakote 500mg  po BID for Bipolar disorder, aggression, and agitation. Resume Seroqeul 250mg  po QHS.  -Continue current neurological workup at this time.  -Psychiatry to attempt psych evaluation tomorrow.

## 2020-11-19 ENCOUNTER — Other Ambulatory Visit: Payer: Self-pay

## 2020-11-19 ENCOUNTER — Inpatient Hospital Stay (HOSPITAL_COMMUNITY): Payer: Medicare Other

## 2020-11-19 DIAGNOSIS — R6520 Severe sepsis without septic shock: Secondary | ICD-10-CM

## 2020-11-19 DIAGNOSIS — F3111 Bipolar disorder, current episode manic without psychotic features, mild: Secondary | ICD-10-CM | POA: Diagnosis present

## 2020-11-19 DIAGNOSIS — A419 Sepsis, unspecified organism: Secondary | ICD-10-CM

## 2020-11-19 DIAGNOSIS — R7881 Bacteremia: Secondary | ICD-10-CM

## 2020-11-19 DIAGNOSIS — I480 Paroxysmal atrial fibrillation: Secondary | ICD-10-CM | POA: Diagnosis not present

## 2020-11-19 DIAGNOSIS — I5032 Chronic diastolic (congestive) heart failure: Secondary | ICD-10-CM | POA: Diagnosis not present

## 2020-11-19 DIAGNOSIS — F3160 Bipolar disorder, current episode mixed, unspecified: Secondary | ICD-10-CM | POA: Diagnosis not present

## 2020-11-19 LAB — CBC WITH DIFFERENTIAL/PLATELET
Abs Immature Granulocytes: 0.02 10*3/uL (ref 0.00–0.07)
Basophils Absolute: 0 10*3/uL (ref 0.0–0.1)
Basophils Relative: 0 %
Eosinophils Absolute: 0.4 10*3/uL (ref 0.0–0.5)
Eosinophils Relative: 6 %
HCT: 41.5 % (ref 36.0–46.0)
Hemoglobin: 13.3 g/dL (ref 12.0–15.0)
Immature Granulocytes: 0 %
Lymphocytes Relative: 28 %
Lymphs Abs: 2.3 10*3/uL (ref 0.7–4.0)
MCH: 29 pg (ref 26.0–34.0)
MCHC: 32 g/dL (ref 30.0–36.0)
MCV: 90.4 fL (ref 80.0–100.0)
Monocytes Absolute: 0.9 10*3/uL (ref 0.1–1.0)
Monocytes Relative: 11 %
Neutro Abs: 4.4 10*3/uL (ref 1.7–7.7)
Neutrophils Relative %: 55 %
Platelets: 242 10*3/uL (ref 150–400)
RBC: 4.59 MIL/uL (ref 3.87–5.11)
RDW: 14.3 % (ref 11.5–15.5)
WBC: 8.1 10*3/uL (ref 4.0–10.5)
nRBC: 0 % (ref 0.0–0.2)

## 2020-11-19 LAB — COMPREHENSIVE METABOLIC PANEL
ALT: 12 U/L (ref 0–44)
AST: 18 U/L (ref 15–41)
Albumin: 3.2 g/dL — ABNORMAL LOW (ref 3.5–5.0)
Alkaline Phosphatase: 42 U/L (ref 38–126)
Anion gap: 10 (ref 5–15)
BUN: 19 mg/dL (ref 8–23)
CO2: 25 mmol/L (ref 22–32)
Calcium: 8.8 mg/dL — ABNORMAL LOW (ref 8.9–10.3)
Chloride: 104 mmol/L (ref 98–111)
Creatinine, Ser: 0.55 mg/dL (ref 0.44–1.00)
GFR, Estimated: >60 mL/min (ref >60)
Glucose, Bld: 83 mg/dL (ref 70–99)
Potassium: 4.2 mmol/L (ref 3.5–5.1)
Sodium: 139 mmol/L (ref 135–145)
Total Bilirubin: 1.2 mg/dL (ref 0.3–1.2)
Total Protein: 6.5 g/dL (ref 6.5–8.1)

## 2020-11-19 LAB — URINE CULTURE: Culture: NO GROWTH

## 2020-11-19 LAB — GLUCOSE, CAPILLARY
Glucose-Capillary: 105 mg/dL — ABNORMAL HIGH (ref 70–99)
Glucose-Capillary: 75 mg/dL (ref 70–99)
Glucose-Capillary: 83 mg/dL (ref 70–99)
Glucose-Capillary: 88 mg/dL (ref 70–99)

## 2020-11-19 LAB — ECHOCARDIOGRAM COMPLETE: S' Lateral: 2.71 cm

## 2020-11-19 LAB — PHOSPHORUS: Phosphorus: 3 mg/dL (ref 2.5–4.6)

## 2020-11-19 LAB — MAGNESIUM: Magnesium: 1.9 mg/dL (ref 1.7–2.4)

## 2020-11-19 IMAGING — MR MR HEAD WO/W CM
16 of 17 series · 41 of 48 positions shown · IV contrast (gadavist)
Comparison: CT head [DATE].  MRI head [DATE]

CLINICAL DATA: Mental status change.  Encephalopathy

EXAM:
MRI HEAD WITHOUT AND WITH CONTRAST
TECHNIQUE: Multiplanar, multiecho pulse sequences of the brain and surrounding
structures were obtained without and with intravenous contrast.
CONTRAST:  7.5mL GADAVIST GADOBUTROL 1 MMOL/ML IV SOLN

[Series 5: DWI · axial · 3.0mm · 1.36mm/px · z∈[-48,+88]mm · 6 of 104 slices shown (1 of 2)]
[im 1/104]
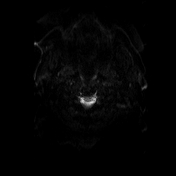
[im 21/104]
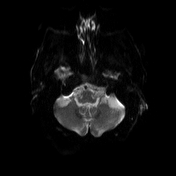
[im 42/104]
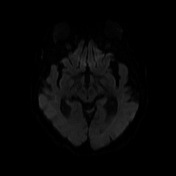
[im 62/104]
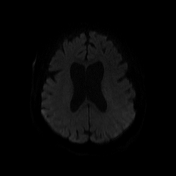
[im 83/104]
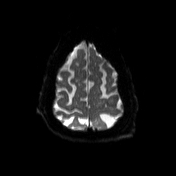
[im 104/104]
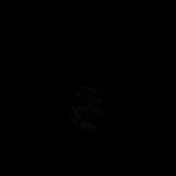

[Series 6: DWI · axial · 3.0mm · 1.36mm/px · z∈[-48,+88]mm · 2 of 52 slices shown (2 of 2)]
[im 1/52]
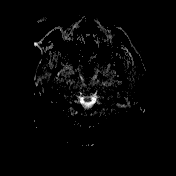
[im 52/52]
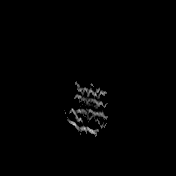

[Series 7: T1 · sagittal · 5.0mm · 0.75mm/px · 1 of 27 slices shown (1 of 4)]
[im 1/27]
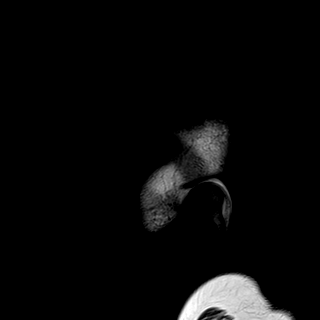

[Series 8: mip_images(sw) · axial · 24.0mm · 0.75mm/px · z∈[-33,+84]mm · 2 of 45 slices shown]
[im 1/45]
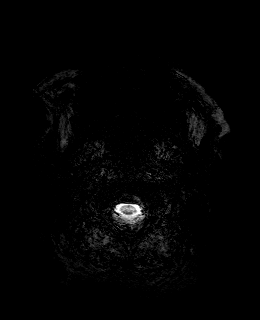
[im 45/45]
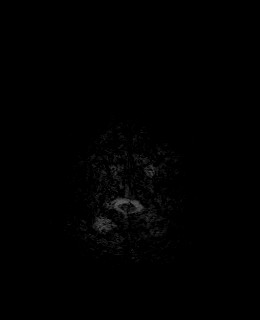

[Series 9: swi_images · axial · 3.0mm · 0.75mm/px · z∈[-43,+93]mm · 2 of 52 slices shown]
[im 1/52]
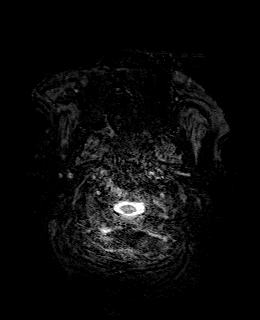
[im 52/52]
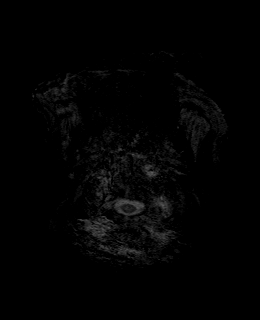

[Series 10: T2 · axial · 5.0mm · 0.62mm/px · 1 of 26 slices shown]
[im 1/26]
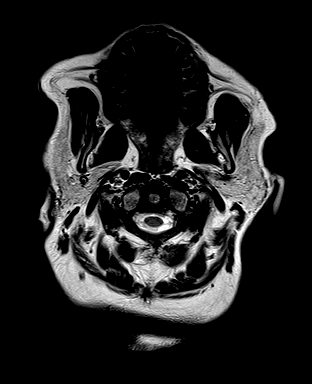

[Series 11: FLAIR · axial · 3.0mm · 0.75mm/px · z∈[-45,+91]mm · 2 of 52 slices shown]
[im 1/52]
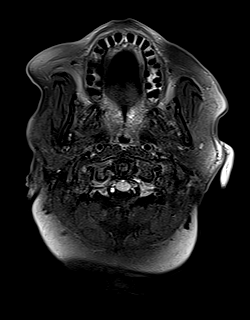
[im 52/52]
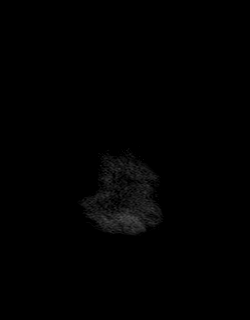

[Series 12: T1 · axial · 1.0mm · 0.94mm/px · z∈[-42,+85]mm · 7 of 144 slices shown (2 of 4)]
[im 1/144]
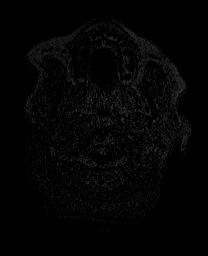
[im 24/144]
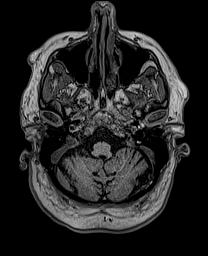
[im 48/144]
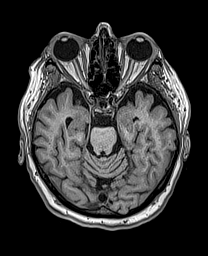
[im 72/144]
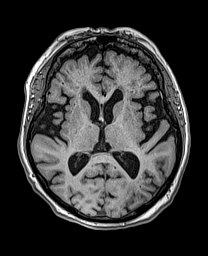
[im 96/144]
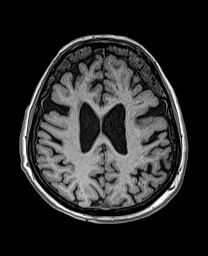
[im 120/144]
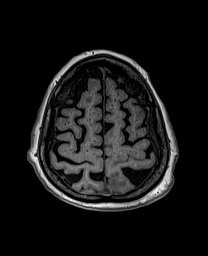
[im 144/144]
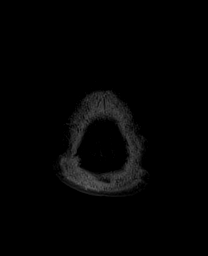

[Series 13: cor dwi_tracew · coronal · 5.0mm · 1.53mm/px · 3 of 60 slices shown]
[im 1/60]
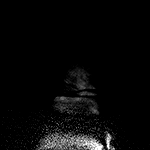
[im 30/60]
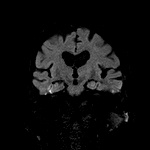
[im 60/60]
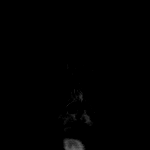

[Series 14: cor dwi_adc · coronal · 5.0mm · 1.53mm/px · 1 of 30 slices shown]
[im 1/30]
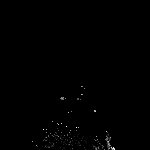

[Series 15: T2 post-contrast · coronal · 5.0mm · 0.57mm/px · 1 of 29 slices shown]
[im 1/29]
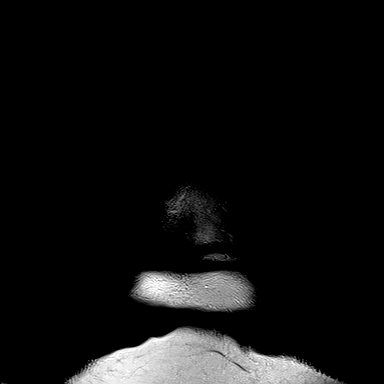

[Series 16: T1 post-contrast · axial · 1.0mm · 0.94mm/px · z∈[-42,+85]mm · 7 of 144 slices shown (1 of 3)]
[im 1/144]
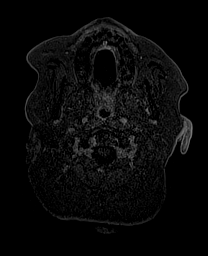
[im 24/144]
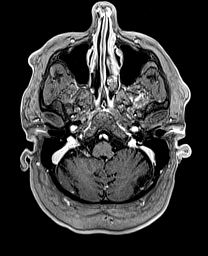
[im 48/144]
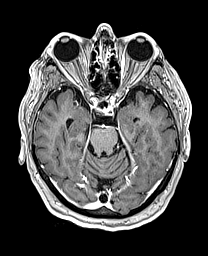
[im 72/144]
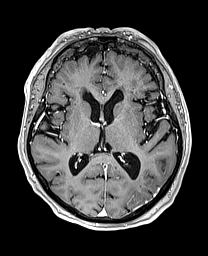
[im 96/144]
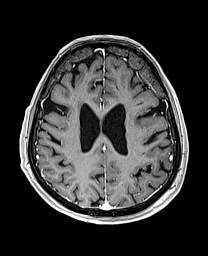
[im 120/144]
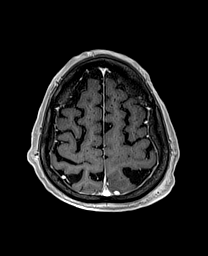
[im 144/144]
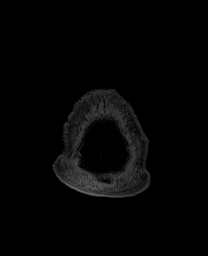

[Series 17: T1 · sagittal · 4.0mm · 0.94mm/px · 2 of 35 slices shown (3 of 4)]
[im 1/35]
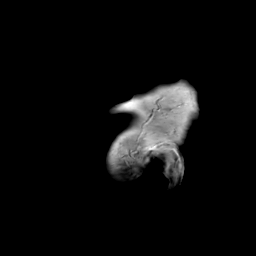
[im 35/35]
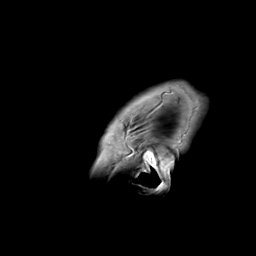

[Series 18: T1 · coronal · 4.0mm · 0.94mm/px · 2 of 45 slices shown (4 of 4)]
[im 1/45]
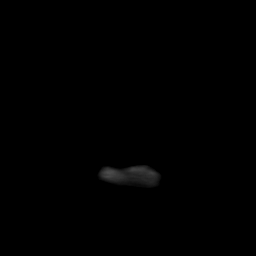
[im 45/45]
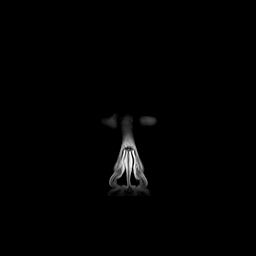

[Series 19: T1 post-contrast · coronal · 5.0mm · 0.43mm/px · 1 of 29 slices shown (2 of 3)]
[im 1/29]
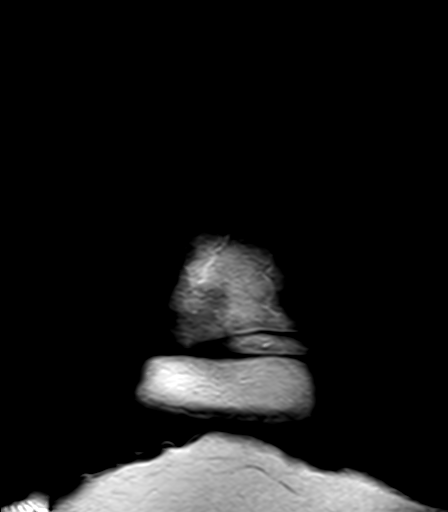

[Series 20: T1 post-contrast · sagittal · 5.0mm · 0.75mm/px · 1 of 27 slices shown (3 of 3)]
[im 1/27]
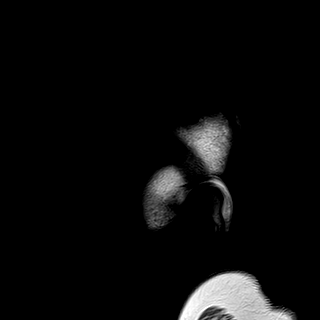

[41 of 48 positions shown; findings below may reference images not displayed]

FINDINGS: Brain: Patchy areas of hyperintensity in the left occipital pole
extending into the left posterior parietal lobe best seen on FLAIR.
This involves primarily the white matter but some areas of cortex.
This area does not show restricted diffusion or susceptibility. This
area was normal on the prior MRI [RT]. No abnormal enhancement.

Moderate atrophy. Mild white matter changes consistent with chronic
microvascular ischemia. Diffusion-weighted imaging negative for
acute infarct. No hemorrhage or mass lesion.

Vascular: Normal arterial flow voids

Skull and upper cervical spine: Negative

Sinuses/Orbits: Mild mucosal edema paranasal sinuses. Negative orbit

Other: None
IMPRESSION: Ill-defined patchy hyperintensity in the left occipital parietal
lobe without restricted diffusion or abnormal enhancement.
Differential diagnosis includes late subacute infarct, encephalitis.
Tumor not considered likely. Correlate with symptoms.

## 2020-11-19 MED ORDER — METOPROLOL TARTRATE 5 MG/5ML IV SOLN
5.0000 mg | Freq: Once | INTRAVENOUS | Status: DC
  Filled 2020-11-19: qty 5

## 2020-11-19 MED ORDER — LORAZEPAM 2 MG/ML IJ SOLN
2.0000 mg | Freq: Once | INTRAMUSCULAR | Status: AC | PRN
  Administered 2020-11-19: 2 mg via INTRAVENOUS
  Filled 2020-11-19: qty 1

## 2020-11-19 MED ORDER — DIVALPROEX SODIUM 250 MG PO DR TAB
500.0000 mg | DELAYED_RELEASE_TABLET | Freq: Two times a day (BID) | ORAL | Status: DC
  Administered 2020-11-20: 500 mg via ORAL
  Filled 2020-11-19 (×5): qty 2

## 2020-11-19 MED ORDER — HALOPERIDOL LACTATE 5 MG/ML IJ SOLN
5.0000 mg | Freq: Once | INTRAMUSCULAR | Status: AC
  Administered 2020-11-19: 5 mg via INTRAMUSCULAR
  Filled 2020-11-19: qty 1

## 2020-11-19 MED ORDER — DIVALPROEX SODIUM 250 MG PO DR TAB: 500.0000 mg | DELAYED_RELEASE_TABLET | Freq: Two times a day (BID) | ORAL | Status: DC

## 2020-11-19 MED ORDER — APIXABAN 5 MG PO TABS
5.0000 mg | ORAL_TABLET | Freq: Two times a day (BID) | ORAL | Status: DC
  Administered 2020-11-20 – 2020-12-02 (×22): 5 mg via ORAL
  Filled 2020-11-19 (×26): qty 1

## 2020-11-19 MED ORDER — QUETIAPINE FUMARATE 25 MG PO TABS
12.5000 mg | ORAL_TABLET | Freq: Two times a day (BID) | ORAL | Status: DC | PRN
  Administered 2020-11-20 – 2020-11-22 (×3): 12.5 mg via ORAL
  Filled 2020-11-19 (×4): qty 1

## 2020-11-19 MED ORDER — GADOBUTROL 1 MMOL/ML IV SOLN
7.5000 mL | Freq: Once | INTRAVENOUS | Status: AC | PRN
  Administered 2020-11-19: 7.5 mL via INTRAVENOUS

## 2020-11-19 MED ORDER — METOPROLOL TARTRATE 5 MG/5ML IV SOLN: 5.0000 mg | Freq: Once | INTRAVENOUS | Status: DC

## 2020-11-19 MED ORDER — PERFLUTREN LIPID MICROSPHERE
1.0000 mL | INTRAVENOUS | Status: AC | PRN
  Administered 2020-11-19: 2 mL via INTRAVENOUS
  Filled 2020-11-19: qty 10

## 2020-11-19 NOTE — Progress Notes (Signed)
SLP Cancellation Note  Patient Details Name: Becky Hendricks MRN: [id number] DOB: 01-23-1946   Cancelled treatment:        Therapist contacted RN at 8;30 am and 1620. Earlier RN stated she was in psychotic state and not following commands. This afternoon pt is sleepy from Ativan given prior to MRI. Will continue efforts tomorrow.    Royce Macadamia 11/19/2020, 4:31 PM

## 2020-11-19 NOTE — Progress Notes (Signed)
Pt has random episodes of incontinence. This writer washed pt up, and attempted to apply barrier lotion on pt due to her constant scratching to her bottom, between her legs and her back. Pt refused to allow this writer to apply any barrier lotion by slapping this writers hands' away and stating "I do NOT trust anything you give me!".  RN aware of this.

## 2020-11-19 NOTE — Progress Notes (Signed)
  Echocardiogram 2D Echocardiogram has been performed.  Becky Hendricks 11/19/2020, 4:47 PM

## 2020-11-19 NOTE — Progress Notes (Signed)
Received pt from [facility], via stretcher with transport group, pt kept on talking different countries, transferred to bed, activities well tolerated, connected pt to tele box 04, called up tele personnel for monitoring, cleaned up pt since wet with urine

## 2020-11-19 NOTE — Progress Notes (Signed)
Reported to Dr. Leonard Schwartz Chothiner via secure message that pt refused to take oral med and IV cannulation since according to sitter pt suddenly pulled out the IV cannula, tried to explain about oral med and IV cannulation but pt started to scream/shouting and became agitated

## 2020-11-19 NOTE — Progress Notes (Signed)
Pt gone to MRI with this Clinical research associate, Comptroller. Pt remained cooperative and calm throughout entire time. Will continue to monitor pt.

## 2020-11-19 NOTE — Consult Note (Signed)
[facility] Face-to-Face Psychiatry Consult   Reason for Consult:  Bipolar d/o with agitation along with AMS Referring Physician:  Dr Marland Mcalpine Patient Identification: Becky Hendricks MRN:  [id number] Principal Diagnosis: Bipolar affective disorder, currently manic, mild (HCC) Diagnosis:  Principal Problem:   Bipolar affective disorder, currently manic, mild (HCC) Active Problems:   Acute metabolic encephalopathy   Severe sepsis (HCC)   AF (paroxysmal atrial fibrillation) (HCC)   Sacral wound   Chronic diastolic CHF (congestive heart failure) (HCC)   Bipolar disorder (HCC)   SIRS (systemic inflammatory response syndrome) (HCC)   Nausea and vomiting   Mixed diabetic hyperlipidemia associated with type 2 diabetes mellitus (HCC)   Type 2 diabetes mellitus with hyperglycemia, with long-term current use of insulin (HCC)   Constipation   Encephalopathy acute   MRSA bacteremia   Total Time spent with patient: 45 minutes  Subjective:   Becky Hendricks is a 76 y.o. female patient admitted with AMS and agitation, history of bipolar disorder.  Client presented to the ED with a plethora of medical diagnoses and bipolar affective disorder with AMS and agitation from a skilled nursing facility.  She was sedated yesterday for testing and unable to be assessed.  Today, she is lying in bed watching television and immediately accosts this provider with "Who are you?  The phone is ringing (not ringing). The first doctor is my doctor.  I know why her husband died."  She is speaking in an accent.  The sitter at the bedside reports her family states she does not have this accent normally and she is not [place].  The sitter stated that she was asleep most of the day yesterday then up all night.  She yells at time but not combative or aggressive with her.  My presence continued to trigger her with "I'm not taking any medicine from you" and accusing me of being [place].  Assessment ended to prevent further escalation.  She  never attempted to get out of be or remove leads or other monitors.    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  Patient is unable to provide a history at this time due to being minimally responsive. Majority of history has been obtained from the brother via phone conversation, discussion with the emergency department staff and review of emergency department notes.  Patient has been exhibiting progressively worsening bouts of aggressive behavior over the past several days. This is been associated with poor oral intake. According to patient's facility patient has not been complaining of pain, has not been coughing, exhibiting shortness of breath, exhibiting diarrhea nausea vomiting or fevers.  Upon evaluation in the emergency department patient was found to have a leukocytosis of 12 with several bouts of fever in the emergency department with T-max of 101.5 F. Patient also exhibited multiple bouts of vomiting and several loose stools. Patient has become increasingly lethargic throughout the emergency department stay. Patient underwent CT imaging of the chest abdomen and pelvis which did not reveal a definitive source of infection. Urinalysis was also performed and found to be unremarkable. The hospitalist group has been called to assess the patient for admission the  hospital.  Psych consult for questionable IVC criteria and medication for behaviors. Patient unable to participate in psychiatric evaluation at this time, as she is  disoriented and continuing to exhibit altered mental status. Team from IR was waiting for patient outside, and she had received Ativan for sedation for her upcoming LP. Chart review indicates patient has not exhibited any disruptive behaviors, agitation, or aggression during this brief hospitalization.  Suspect patient has encephalopathy vs delirium as it relates to her underlying infection. Patient is currently scheduled Depakote 500mg  po BID (Depakote level < 10) on admission, and Seroquel 250mg  po qhs. There remains questions if she has received her medications in the LTAC, due to her poor oral intake.   Past Psychiatric History: bipolar  Risk to Self:  none Risk to Others:  none Prior Inpatient Therapy:  unknown Prior Outpatient Therapy:  unknown  Past Medical History:  Past Medical History:  Diagnosis Date  . Atrial fibrillation (HCC)   . Bipolar disorder (HCC)   . Bipolar disorder (HCC) 04/27/2020  . Clostridium difficile diarrhea 08/02/2019  . Dehydration   . Essential hypertension   . Fall 02/11/2020  . Morbid obesity (HCC)   . Osteomyelitis (HCC) 12/03/2019  . Pneumonia due to COVID-19 virus 09/15/2019  . Type 2 diabetes mellitus (HCC)   . Weakness     Past Surgical History:  Procedure Laterality Date  . INCISION AND DRAINAGE PERIRECTAL ABSCESS Left 12/04/2019   Procedure: IRRIGATION AND DEBRIDEMENT LEFT BUTTOCK ABSCESS;  Surgeon: 11/13/2019, MD;  Location: [facility] OR;  Service: General;  Laterality: Left;  . INCISION AND DRAINAGE PERIRECTAL ABSCESS Left 12/10/2019   Procedure: REPEAT IRRIGATION AND DEBRIDEMENT BUTTOCK  ABSCESS;  Surgeon: [facility], MD;  Location: [facility] OR;  Service: General;  Laterality: Left;   Family History:  Family History  Family history unknown: Yes   Family Psychiatric  History: unknown Social History:  Social History   Substance and Sexual Activity  Alcohol Use Not Currently     Social History   Substance and Sexual Activity  Drug Use Not  Currently    Social History   Socioeconomic History  . Marital status: Single    Spouse name: Not on file  . Number of children: Not on file  . Years of education: Not on file  . Highest education level: Not on file  Occupational History  . Not on file  Tobacco Use  . Smoking status: Never Smoker  . Smokeless tobacco: Never Used  Vaping Use  . Vaping Use: Unknown  Substance and Sexual Activity  . Alcohol use: Not Currently  . Drug use: Not Currently  . Sexual activity: Not Currently    Birth control/protection: Post-menopausal  Other Topics Concern  . Not on file  Social History Narrative  . Not on file   Social Determinants of Health   Financial Resource Strain: Not on file  Food Insecurity: Not on file  Transportation Needs: Not on file  Physical Activity: Not on file  Stress: Not on file  Social Connections: Not on file   Additional Social History:    Allergies:   Allergies  Allergen Reactions  . Chlorhexidine Gluconate Itching  . Acetazolamide Er Rash    Labs:  Results for orders placed or performed during the hospital encounter of 11/17/20 (from the past 48 hour(s))  CBC     Status: Abnormal   Collection Time: 11/17/20  4:00 PM  Result Value Ref Range   WBC 12.0 (H) 4.0 - 10.5  K/uL   RBC 4.69 3.87 - 5.11 MIL/uL   Hemoglobin 13.4 12.0 - 15.0 g/dL   HCT 67.1 24.5 - 80.9 %   MCV 89.6 80.0 - 100.0 fL   MCH 28.6 26.0 - 34.0 pg   MCHC 31.9 30.0 - 36.0 g/dL   RDW 98.3 38.2 - 50.5 %   Platelets 248 150 - 400 K/uL   nRBC 0.0 0.0 - 0.2 %    Comment: Performed at [facility], [address] [place], [place] [address]  Comprehensive metabolic panel     Status: Abnormal   Collection Time: 11/17/20  4:00 PM  Result Value Ref Range   Sodium 135 135 - 145 mmol/L   Potassium 3.9 3.5 - 5.1 mmol/L   Chloride 97 (L) 98 - 111 mmol/L   CO2 26 22 - 32 mmol/L   Glucose, Bld 152 (H) 70 - 99 mg/dL    Comment: Glucose reference range applies only  to samples taken after fasting for at least 8 hours.   BUN 17 8 - 23 mg/dL   Creatinine, Ser 3.41 0.44 - 1.00 mg/dL   Calcium 9.4 8.9 - 93.7 mg/dL   Total Protein 7.6 6.5 - 8.1 g/dL   Albumin 3.7 3.5 - 5.0 g/dL   AST 13 (L) 15 - 41 U/L   ALT 15 0 - 44 U/L   Alkaline Phosphatase 54 38 - 126 U/L   Total Bilirubin 0.9 0.3 - 1.2 mg/dL   GFR, Estimated >90 >24 mL/min    Comment: (NOTE) Calculated using the CKD-EPI Creatinine Equation (2021)    Anion gap 12 5 - 15    Comment: Performed at [facility], [address] [place], [place] [address]  Valproic acid level     Status: Abnormal   Collection Time: 11/17/20  4:00 PM  Result Value Ref Range   Valproic Acid Lvl <10 (L) 50.0 - 100.0 ug/mL    Comment: RESULTS CONFIRMED BY MANUAL DILUTION Performed at [facility], [address] [place], [place] [address]   Procalcitonin - Baseline     Status: None   Collection Time: 11/17/20  4:00 PM  Result Value Ref Range   Procalcitonin <0.10 ng/mL    Comment:        Interpretation: PCT (Procalcitonin) <= 0.5 ng/mL: Systemic infection (sepsis) is not likely. Local bacterial infection is possible. (NOTE)       Sepsis PCT Algorithm           Lower Respiratory Tract                                      Infection PCT Algorithm    ----------------------------     ----------------------------         PCT < 0.25 ng/mL                PCT < 0.10 ng/mL          Strongly encourage             Strongly discourage   discontinuation of antibiotics    initiation of antibiotics    ----------------------------     -----------------------------       PCT 0.25 - 0.50 ng/mL            PCT 0.10 - 0.25 ng/mL  OR       >80% decrease in PCT            Discourage initiation of                                            antibiotics      Encourage discontinuation           of antibiotics    ----------------------------     -----------------------------          PCT >= 0.50 ng/mL              PCT 0.26 - 0.50 ng/mL               AND        <80% decrease in PCT             Encourage initiation of                                             antibiotics       Encourage continuation           of antibiotics    ----------------------------     -----------------------------        PCT >= 0.50 ng/mL                  PCT > 0.50 ng/mL               AND         increase in PCT                  Strongly encourage                                      initiation of antibiotics    Strongly encourage escalation           of antibiotics                                     -----------------------------                                           PCT <= 0.25 ng/mL                                                 OR                                        > 80% decrease in PCT                                      Discontinue / Do not initiate  antibiotics  Performed at [facility], [address] [place], [place] [address]   Urinalysis, Routine w reflex microscopic Urine, Catheterized     Status: Abnormal   Collection Time: 11/17/20  5:14 PM  Result Value Ref Range   Color, Urine STRAW (A) YELLOW   APPearance CLEAR CLEAR   Specific Gravity, Urine 1.013 1.005 - 1.030   pH 8.0 5.0 - 8.0   Glucose, UA 50 (A) NEGATIVE mg/dL   Hgb urine dipstick NEGATIVE NEGATIVE   Bilirubin Urine NEGATIVE NEGATIVE   Ketones, ur 20 (A) NEGATIVE mg/dL   Protein, ur NEGATIVE NEGATIVE mg/dL   Nitrite NEGATIVE NEGATIVE   Leukocytes,Ua NEGATIVE NEGATIVE    Comment: Performed at [facility], [address] [place], [place] [address]  Culture, blood (routine x 2)     Status: None (Preliminary result)   Collection Time: 11/17/20  6:42 PM   Specimen: BLOOD  Result Value Ref Range   Specimen Description      BLOOD LEFT ANTECUBITAL Performed at [facility], [address]  [place], [place] [address]    Special Requests      BOTTLES DRAWN AEROBIC AND ANAEROBIC Blood Culture adequate volume Performed at [facility], [address] [place], [place] [address]    Culture      NO GROWTH < 12 HOURS Performed at [facility], [address] [place], [place] [address]    Report Status PENDING   Lactic acid, plasma     Status: None   Collection Time: 11/17/20  6:42 PM  Result Value Ref Range   Lactic Acid, Venous 1.4 0.5 - 1.9 mmol/L    Comment: Performed at [facility], [address] [place], [place] [address]  Culture, blood (routine x 2)     Status: Abnormal (Preliminary result)   Collection Time: 11/17/20  6:45 PM   Specimen: BLOOD LEFT FOREARM  Result Value Ref Range   Specimen Description      BLOOD LEFT FOREARM Performed at [facility], [address] [place], [place] [address]    Special Requests      BOTTLES DRAWN AEROBIC AND ANAEROBIC Blood Culture adequate volume Performed at [facility], [address] [place], [place] [address]    Culture  Setup Time      GRAM POSITIVE COCCI IN CLUSTERS AEROBIC BOTTLE ONLY CRITICAL RESULT CALLED TO, READ BACK BY AND VERIFIED WITH: PHARMD CHRISTINE SHADE ON 11/18/20 AT 1508 BY KJ Performed at [facility], [address] [place], [place] [address]    Culture STAPHYLOCOCCUS AUREUS (A)    Report Status PENDING   Blood Culture ID Panel (Reflexed)     Status: Abnormal   Collection Time: 11/17/20  6:45 PM  Result Value Ref Range   Enterococcus faecalis NOT DETECTED NOT DETECTED   Enterococcus Faecium NOT DETECTED NOT DETECTED   Listeria monocytogenes NOT DETECTED NOT DETECTED   Staphylococcus species DETECTED (A) NOT DETECTED    Comment: CRITICAL RESULT CALLED TO, READ BACK BY AND VERIFIED WITH: PHARMD CHRISTINE SHADE ON 11/18/20 AT 1508 BY KJ    Staphylococcus aureus (BCID) DETECTED (A) NOT DETECTED    Comment:  Methicillin (oxacillin)-resistant Staphylococcus aureus (MRSA). MRSA is predictably resistant to beta-lactam antibiotics (except ceftaroline). Preferred therapy is vancomycin unless clinically contraindicated. Patient requires contact precautions if  hospitalized. CRITICAL RESULT CALLED TO, READ BACK BY AND VERIFIED WITH: PHARMD CHRISTINE SHADE ON  11/18/20 AT 1508 BY KJ    Staphylococcus epidermidis NOT DETECTED NOT DETECTED   Staphylococcus lugdunensis NOT DETECTED NOT DETECTED   Streptococcus species NOT DETECTED NOT DETECTED   Streptococcus agalactiae NOT DETECTED NOT DETECTED   Streptococcus pneumoniae NOT DETECTED NOT DETECTED   Streptococcus pyogenes NOT DETECTED NOT DETECTED   A.calcoaceticus-baumannii NOT DETECTED NOT DETECTED   Bacteroides fragilis NOT DETECTED NOT DETECTED   Enterobacterales NOT DETECTED NOT DETECTED   Enterobacter cloacae complex NOT DETECTED NOT DETECTED   Escherichia coli NOT DETECTED NOT DETECTED   Klebsiella aerogenes NOT DETECTED NOT DETECTED   Klebsiella oxytoca NOT DETECTED NOT DETECTED   Klebsiella pneumoniae NOT DETECTED NOT DETECTED   Proteus species NOT DETECTED NOT DETECTED   Salmonella species NOT DETECTED NOT DETECTED   Serratia marcescens NOT DETECTED NOT DETECTED   Haemophilus influenzae NOT DETECTED NOT DETECTED   Neisseria meningitidis NOT DETECTED NOT DETECTED   Pseudomonas aeruginosa NOT DETECTED NOT DETECTED   Stenotrophomonas maltophilia NOT DETECTED NOT DETECTED   Candida albicans NOT DETECTED NOT DETECTED   Candida auris NOT DETECTED NOT DETECTED   Candida glabrata NOT DETECTED NOT DETECTED   Candida krusei NOT DETECTED NOT DETECTED   Candida parapsilosis NOT DETECTED NOT DETECTED   Candida tropicalis NOT DETECTED NOT DETECTED   Cryptococcus neoformans/gattii NOT DETECTED NOT DETECTED   Meth resistant mecA/C and MREJ DETECTED (A) NOT DETECTED    Comment: CRITICAL RESULT CALLED TO, READ BACK BY AND VERIFIED WITH: PHARMD CHRISTINE  SHADE ON 11/18/20 AT 1508 BY KJ Performed at [facility], [address], [place], [place] [address]   Resp Panel by RT-PCR (Flu A&B, Covid) Nasopharyngeal Swab     Status: None   Collection Time: 11/17/20  7:12 PM   Specimen: Nasopharyngeal Swab; Nasopharyngeal(NP) swabs in vial transport medium  Result Value Ref Range   SARS Coronavirus 2 by RT PCR NEGATIVE NEGATIVE    Comment: (NOTE) SARS-CoV-2 target nucleic acids are NOT DETECTED.  The SARS-CoV-2 RNA is generally detectable in upper respiratory specimens during the acute phase of infection. The lowest concentration of SARS-CoV-2 viral copies this assay can detect is 138 copies/mL. A negative result does not preclude SARS-Cov-2 infection and should not be used as the sole basis for treatment or other patient management decisions. A negative result may occur with  improper specimen collection/handling, submission of specimen other than nasopharyngeal swab, presence of viral mutation(s) within the areas targeted by this assay, and inadequate number of viral copies(<138 copies/mL). A negative result must be combined with clinical observations, patient history, and epidemiological information. The expected result is Negative.  Fact Sheet for Patients:  BloggerCourse.com  Fact Sheet for Healthcare Providers:  SeriousBroker.it  This test is no t yet approved or cleared by the [place] FDA and  has been authorized for detection and/or diagnosis of SARS-CoV-2 by FDA under an Emergency Use Authorization (EUA). This EUA will remain  in effect (meaning this test can be used) for the duration of the COVID-19 declaration under Section 564(b)(1) of the Act, 21 U.S.C.section 360bbb-3(b)(1), unless the authorization is terminated  or revoked sooner.       Influenza A by PCR NEGATIVE NEGATIVE   Influenza B by PCR NEGATIVE NEGATIVE    Comment: (NOTE) The Xpert Xpress  SARS-CoV-2/FLU/RSV plus assay is intended as an aid in the diagnosis of influenza from Nasopharyngeal swab specimens and should not be used as a sole basis for treatment. Nasal washings and aspirates are unacceptable for  Xpert Xpress SARS-CoV-2/FLU/RSV testing.  Fact Sheet for Patients: BloggerCourse.com  Fact Sheet for Healthcare Providers: SeriousBroker.it  This test is not yet approved or cleared by the [place] FDA and has been authorized for detection and/or diagnosis of SARS-CoV-2 by FDA under an Emergency Use Authorization (EUA). This EUA will remain in effect (meaning this test can be used) for the duration of the COVID-19 declaration under Section 564(b)(1) of the Act, 21 U.S.C. section 360bbb-3(b)(1), unless the authorization is terminated or revoked.  Performed at [facility], [address], [place], [place] [address]   CBG monitoring, ED     Status: Abnormal   Collection Time: 11/18/20 12:46 AM  Result Value Ref Range   Glucose-Capillary 130 (H) 70 - 99 mg/dL    Comment: Glucose reference range applies only to samples taken after fasting for at least 8 hours.  Lactic acid, plasma     Status: None   Collection Time: 11/18/20  1:54 AM  Result Value Ref Range   Lactic Acid, Venous 1.5 0.5 - 1.9 mmol/L    Comment: Performed at [facility], [address] [place], [place] [address]  TSH     Status: None   Collection Time: 11/18/20  1:54 AM  Result Value Ref Range   TSH 0.771 0.350 - 4.500 uIU/mL    Comment: Performed by a 3rd Generation assay with a functional sensitivity of <=0.01 uIU/mL. Performed at [facility], [address] [place], [place] [address]   Vitamin B12     Status: None   Collection Time: 11/18/20  1:54 AM  Result Value Ref Range   Vitamin B-12 211 180 - 914 pg/mL    Comment: (NOTE) This assay is not validated for testing neonatal  or myeloproliferative syndrome specimens for Vitamin B12 levels. Performed at [facility], [address], [place], [place] [address]   Folate     Status: None   Collection Time: 11/18/20  1:54 AM  Result Value Ref Range   Folate 35.1 >5.9 ng/mL    Comment: RESULTS CONFIRMED BY MANUAL DILUTION Performed at [facility], [address], [place], [place] [address]   Ammonia     Status: None   Collection Time: 11/18/20  1:54 AM  Result Value Ref Range   Ammonia 22 9 - 35 umol/L    Comment: Performed at [facility], [address] [place], [place] [address]  C-reactive protein     Status: Abnormal   Collection Time: 11/18/20  1:54 AM  Result Value Ref Range   CRP 1.1 (H) <1.0 mg/dL    Comment: Performed at [facility], [address] [place], [place] [address]  Hemoglobin A1c     Status: Abnormal   Collection Time: 11/18/20  1:54 AM  Result Value Ref Range   Hgb A1c MFr Bld 6.1 (H) 4.8 - 5.6 %    Comment: (NOTE) Pre diabetes:          5.7%-6.4%  Diabetes:              >6.4%  Glycemic control for   <7.0% adults with diabetes    Mean Plasma Glucose 128.37 mg/dL    Comment: Performed at [facility], [address] [place], [place] [address]  RPR     Status: None   Collection Time: 11/18/20  1:54 AM  Result Value Ref Range   RPR Ser Ql NON REACTIVE NON  REACTIVE    Comment: Performed at [facility], [address] [place], [place] [address]  Cortisol-am, blood     Status: None   Collection Time: 11/18/20  1:54 AM  Result Value Ref Range   Cortisol - AM 12.9 6.7 - 22.6 ug/dL    Comment: Performed at [facility], [name] [place], [place] [address]  Procalcitonin     Status: None   Collection Time: 11/18/20  1:54 AM  Result Value Ref Range   Procalcitonin <0.10 ng/mL    Comment:        Interpretation: PCT (Procalcitonin) <= 0.5 ng/mL: Systemic infection (sepsis) is  not likely. Local bacterial infection is possible. (NOTE)       Sepsis PCT Algorithm           Lower Respiratory Tract                                      Infection PCT Algorithm    ----------------------------     ----------------------------         PCT < 0.25 ng/mL                PCT < 0.10 ng/mL          Strongly encourage             Strongly discourage   discontinuation of antibiotics    initiation of antibiotics    ----------------------------     -----------------------------       PCT 0.25 - 0.50 ng/mL            PCT 0.10 - 0.25 ng/mL               OR       >80% decrease in PCT            Discourage initiation of                                            antibiotics      Encourage discontinuation           of antibiotics    ----------------------------     -----------------------------         PCT >= 0.50 ng/mL              PCT 0.26 - 0.50 ng/mL               AND        <80% decrease in PCT             Encourage initiation of                                             antibiotics       Encourage continuation           of antibiotics    ----------------------------     -----------------------------        PCT >= 0.50 ng/mL                  PCT > 0.50 ng/mL               AND  increase in PCT                  Strongly encourage                                      initiation of antibiotics    Strongly encourage escalation           of antibiotics                                     -----------------------------                                           PCT <= 0.25 ng/mL                                                 OR                                        > 80% decrease in PCT                                      Discontinue / Do not initiate                                             antibiotics  Performed at [facility], [address] [place], [place] [address]   Comprehensive metabolic panel     Status: Abnormal   Collection  Time: 11/18/20  1:54 AM  Result Value Ref Range   Sodium 133 (L) 135 - 145 mmol/L   Potassium 4.1 3.5 - 5.1 mmol/L   Chloride 96 (L) 98 - 111 mmol/L   CO2 27 22 - 32 mmol/L   Glucose, Bld 155 (H) 70 - 99 mg/dL    Comment: Glucose reference range applies only to samples taken after fasting for at least 8 hours.   BUN 18 8 - 23 mg/dL   Creatinine, Ser 6.04 0.44 - 1.00 mg/dL   Calcium 8.9 8.9 - 54.0 mg/dL   Total Protein 6.6 6.5 - 8.1 g/dL   Albumin 3.3 (L) 3.5 - 5.0 g/dL   AST 13 (L) 15 - 41 U/L   ALT 13 0 - 44 U/L   Alkaline Phosphatase 43 38 - 126 U/L   Total Bilirubin 0.6 0.3 - 1.2 mg/dL   GFR, Estimated >98 >11 mL/min    Comment: (NOTE) Calculated using the CKD-EPI Creatinine Equation (2021)    Anion gap 10 5 - 15    Comment: Performed at [facility], [address], [place], [place] [address]  Magnesium     Status: None   Collection Time: 11/18/20  1:54 AM  Result Value Ref Range   Magnesium 1.7 1.7 - 2.4 mg/dL  Comment: Performed at [facility], [address], [place], [place] [address]  CBC WITH DIFFERENTIAL     Status: Abnormal   Collection Time: 11/18/20  1:54 AM  Result Value Ref Range   WBC 11.8 (H) 4.0 - 10.5 K/uL   RBC 4.42 3.87 - 5.11 MIL/uL   Hemoglobin 12.7 12.0 - 15.0 g/dL   HCT 60.4 54.0 - 98.1 %   MCV 89.8 80.0 - 100.0 fL   MCH 28.7 26.0 - 34.0 pg   MCHC 32.0 30.0 - 36.0 g/dL   RDW 19.1 47.8 - 29.5 %   Platelets 227 150 - 400 K/uL   nRBC 0.0 0.0 - 0.2 %   Neutrophils Relative % 63 %   Neutro Abs 7.4 1.7 - 7.7 K/uL   Lymphocytes Relative 27 %   Lymphs Abs 3.1 0.7 - 4.0 K/uL   Monocytes Relative 10 %   Monocytes Absolute 1.2 (H) 0.1 - 1.0 K/uL   Eosinophils Relative 0 %   Eosinophils Absolute 0.0 0.0 - 0.5 K/uL   Basophils Relative 0 %   Basophils Absolute 0.0 0.0 - 0.1 K/uL   Immature Granulocytes 0 %   Abs Immature Granulocytes 0.04 0.00 - 0.07 K/uL    Comment: Performed at [facility],  [address], [place], [place] [address]  APTT     Status: None   Collection Time: 11/18/20  1:54 AM  Result Value Ref Range   aPTT 32 24 - 36 seconds    Comment: Performed at [facility], [address], [place], [place] [address]  Glucose, capillary     Status: Abnormal   Collection Time: 11/18/20  5:36 AM  Result Value Ref Range   Glucose-Capillary 144 (H) 70 - 99 mg/dL    Comment: Glucose reference range applies only to samples taken after fasting for at least 8 hours.  Glucose, capillary     Status: Abnormal   Collection Time: 11/18/20  7:33 AM  Result Value Ref Range   Glucose-Capillary 165 (H) 70 - 99 mg/dL    Comment: Glucose reference range applies only to samples taken after fasting for at least 8 hours.   Comment 1 Notify RN   Protime-INR     Status: None   Collection Time: 11/18/20 11:09 AM  Result Value Ref Range   Prothrombin Time 13.4 11.4 - 15.2 seconds   INR 1.1 0.8 - 1.2    Comment: (NOTE) INR goal varies based on device and disease states. Performed at [facility] [place], [address], [place], [place] [address]   Lactic acid     Status: None   Collection Time: 11/18/20 11:09 AM  Result Value Ref Range   Lactic Acid, Venous 1.5 0.5 - 1.9 mmol/L    Comment: Performed at [facility], [address] [place], [place] [address]  Glucose, capillary     Status: Abnormal   Collection Time: 11/18/20 11:53 AM  Result Value Ref Range   Glucose-Capillary 151 (H) 70 - 99 mg/dL    Comment: Glucose reference range applies only to samples taken after fasting for at least 8 hours.   Comment 1 Notify RN   CSF cell count with differential     Status: Abnormal   Collection Time: 11/18/20  1:56 PM  Result Value Ref Range   Tube # 1    Color, CSF COLORLESS COLORLESS   Appearance, CSF CLEAR CLEAR   Supernatant NOT INDICATED  RBC Count, CSF 3 (H) 0 /cu mm   WBC, CSF 3 0 - 5 /cu mm   Segmented Neutrophils-CSF 16 (H)  0 - 6 %   Lymphs, CSF 60 40 - 80 %   Monocyte-Macrophage-Spinal Fluid 24 15 - 45 %   Eosinophils, CSF 0 0 - 1 %    Comment: Performed at [facility], [address], [place], [place] [address]  Protein and glucose, CSF     Status: Abnormal   Collection Time: 11/18/20  1:56 PM  Result Value Ref Range   Glucose, CSF 87 (H) 40 - 70 mg/dL   Total  Protein, CSF 31 15 - 45 mg/dL    Comment: Performed at [facility], [address] [place], [place] [address]  CSF culture     Status: None (Preliminary result)   Collection Time: 11/18/20  1:56 PM   Specimen: Lumbar Puncture; Cerebrospinal Fluid  Result Value Ref Range   Specimen Description CSF    Special Requests LP    Gram Stain      WBC PRESENT, PREDOMINANTLY MONONUCLEAR NO ORGANISMS SEEN CYTOSPIN SMEAR Gram Stain Report Called to,Read Back By and Verified With: H.JENKINS, RN AT 1614 ON 04.08.22 BY N.THOMPSON Performed at [facility], [address], [place], [place] [address]    Culture PENDING    Report Status PENDING   CSF cell count with differential     Status: Abnormal   Collection Time: 11/18/20  1:56 PM  Result Value Ref Range   Tube # 4    Color, CSF COLORLESS COLORLESS   Appearance, CSF CLEAR CLEAR   Supernatant NOT INDICATED    RBC Count, CSF 1 (H) 0 /cu mm   WBC, CSF 6 (H) 0 - 5 /cu mm   Segmented Neutrophils-CSF 27 (H) 0 - 6 %   Lymphs, CSF 60 40 - 80 %   Monocyte-Macrophage-Spinal Fluid 13 (L) 15 - 45 %   Eosinophils, CSF 0 0 - 1 %    Comment: Performed at [facility], [address] [place], [place] [address]  Glucose, capillary     Status: Abnormal   Collection Time: 11/18/20  4:33 PM  Result Value Ref Range   Glucose-Capillary 110 (H) 70 - 99 mg/dL    Comment: Glucose reference range applies only to samples taken after fasting for at least 8 hours.   Comment 1 Notify RN   Glucose, capillary     Status: Abnormal   Collection  Time: 11/18/20 11:58 PM  Result Value Ref Range   Glucose-Capillary 105 (H) 70 - 99 mg/dL    Comment: Glucose reference range applies only to samples taken after fasting for at least 8 hours.  Glucose, capillary     Status: None   Collection Time: 11/19/20  6:31 AM  Result Value Ref Range   Glucose-Capillary 83 70 - 99 mg/dL    Comment: Glucose reference range applies only to samples taken after fasting for at least 8 hours.    Current Facility-Administered Medications  Medication Dose Route Frequency Provider Last Rate Last Admin  . 0.9 %  sodium chloride infusion   Intravenous Continuous Marguerita Merles [place], [place] 75 mL/hr at 11/19/20 0308 New Bag at 11/19/20 0308  . acetaminophen (TYLENOL) tablet 650 mg  650 mg Oral Q6H PRN Shalhoub, Deno Lunger, MD       Or  . acetaminophen (TYLENOL) suppository 650 mg  650 mg Rectal Q6H PRN Shalhoub,  Deno Lunger, MD      . divalproex (DEPAKOTE) DR tablet 500 mg  500 mg Oral BID Blount, Andi Devon T, NP      . insulin aspart (novoLOG) injection 0-15 Units  0-15 Units Subcutaneous Q6H Shalhoub, Deno Lunger, MD   3 Units at 11/18/20 1301  . ondansetron (ZOFRAN) tablet 4 mg  4 mg Oral Q6H PRN Shalhoub, Deno Lunger, MD       Or  . ondansetron [facility]) injection 4 mg  4 mg Intravenous Q6H PRN Shalhoub, Deno Lunger, MD      . QUEtiapine (SEROQUEL) tablet 200 mg  200 mg Oral QHS Maryagnes Amos, FNP      . QUEtiapine (SEROQUEL) tablet 50 mg  50 mg Oral QHS Maryagnes Amos, FNP      . vancomycin (VANCOREADY) IVPB 1250 mg/250 mL  1,250 mg Intravenous Q24H Marinda Elk, MD 166.7 mL/hr at 11/18/20 2237 1,250 mg at 11/18/20 2237    Musculoskeletal: Strength & Muscle Tone: within normal limits Gait & Station: did not witness Patient leans: N/A  Psychiatric Specialty Exam: Physical Exam Vitals and nursing note reviewed.  Constitutional:      Appearance: Normal appearance.  HENT:     Head: Normocephalic.     Nose: Nose normal.  Pulmonary:     Effort:  Pulmonary effort is normal.  Neurological:     General: No focal deficit present.     Mental Status: She is alert.  Psychiatric:        Attention and Perception: She is inattentive.        Mood and Affect: Mood is anxious. Affect is angry.        Speech: Speech normal.        Behavior: Behavior is agitated.        Thought Content: Thought content normal.        Cognition and Memory: Cognition is impaired.        Judgment: Judgment is impulsive.     Review of Systems  Psychiatric/Behavioral: Positive for memory loss. The patient is nervous/anxious.   All other systems reviewed and are negative.   Blood pressure (!) 174/97, pulse 69, temperature 98 F (36.7 C), temperature source Oral, resp. rate 17, SpO2 98 %.There is no height or weight on file to calculate BMI.  General Appearance: Casual  Eye Contact:  Fair  Speech:  Normal Rate  Volume:  Increased  Mood:  Angry, Anxious and Irritable  Affect:  Congruent  Thought Process:  Disorganized and Irrelevant  Orientation:  Other:  person  Thought Content:  Delusions  Suicidal Thoughts:  No  Homicidal Thoughts:  No  Memory:  Immediate;   Poor Recent;   Poor Remote;   Poor  Judgement:  Impaired  Insight:  Lacking  Psychomotor Activity:  Normal  Concentration:  Concentration: Fair and Attention Span: Fair  Recall:  Poor  Fund of Knowledge:  Fair  Language:  Good  Akathisia:  No  Handed:  Right  AIMS (if indicated):     Assets:  Housing Leisure Time Resilience Social Support  ADL's:  Impaired  Cognition:  Impaired,  Moderate  Sleep:      Physical Exam: Physical Exam Vitals and nursing note reviewed.  Constitutional:      Appearance: Normal appearance.  HENT:     Head: Normocephalic.     Nose: Nose normal.  Pulmonary:     Effort: Pulmonary effort is normal.  Neurological:     General: No  focal deficit present.     Mental Status: She is alert.  Psychiatric:        Attention and Perception: She is inattentive.         Mood and Affect: Mood is anxious. Affect is angry.        Speech: Speech normal.        Behavior: Behavior is agitated.        Thought Content: Thought content normal.        Cognition and Memory: Cognition is impaired.        Judgment: Judgment is impulsive.    Review of Systems  Psychiatric/Behavioral: Positive for memory loss. The patient is nervous/anxious.   All other systems reviewed and are negative.  Blood pressure (!) 174/97, pulse 69, temperature 98 F (36.7 C), temperature source Oral, resp. rate 17, SpO2 98 %. There is no height or weight on file to calculate BMI.  Treatment Plan Summary: Patient likely has ongoing delirium which presents as fluctuating cognition.    The patient's exam is notable for altered sensorium, perceptual disturbances, disorientation and cognitive deficits that appear markedly different than their baseline, suggesting a diagnosis of delirium.  Virtually any medical condition or physiologic stress can precipitate delirium in a susceptible individual, with risk increasing in those with: advanced age, sensory impairments, organic brain disease (stroke, dementia, Parkinsons), psychiatric illness, major chronic medical issues, prolonged hospitalizations, postoperative status, anemia, insomnia/disturbed sleep, and severe pain. Addressing the underlying medical condition and institution of preventative measures are recommended.  - Continue to monitor and treat underlying medical causes of delirium, including infection, electrolyte disturbances, etc. - Delirium precautions - Minimize/avoid deliriogenic meds including: anticholinergic, opiates, benzodiazepines           - Maintain hydration, oxygenation, nutrition           - Limit use of restraints and catheters           - Normalize sleep patterns by minimizing nighttime noise, light and interruptions by     -Ensure sleep apnea treatment is provided overnight.             -Clustering care, opening blinds  during the day           - Reorient the patient frequently, provide easily visible clock and calendar           - Provide sensory aids like glasses, hearing aids           - Encourage ambulation, regular activities and visitors to maintain cognitive stimulation   -Patient would benefit from having family members at bedside to reinforce his orientation.  Bipolar affective disorder, mania, mild: -Continue Depakote 500 mg BID -Continue Seroquel 250 mg at bedtime  Anxiety: -Continue gabapentin 100 mg BID  Disposition: Supportive therapy provided about ongoing stressors.  Nanine Means, NP 11/19/2020 9:49 AM

## 2020-11-19 NOTE — Progress Notes (Signed)
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  Initially she is unable to provide a history due to being minimally responsive and most of the history was obtained by her patient's brother and the ED staff as well as review of other notes.  The patient has been exhibiting progressively worsened bouts of aggressive behavior last few days and has also had associated poor oral intake and according to the patient's facility she has not been coughing or exhibiting any shortness of breath, diarrhea nausea or vomiting.  Due to progressively worsening symptoms and altered mental status she symptoms worsen all emergency room for further evaluation.  Upon evaluation in the ED she is found to have a leukocytosis of several bouts of fever in the ED with a T-max of 101.5.  She also exhibited multiple bouts of vomiting and several loose stools and she became increasingly lethargic throughout emergency department stay.  She underwent CT imaging of the abdomen pelvis which was unrevealing and did not reveal definitive source of infection.  Urinalysis also performed and found to be unremarkable  **She continues to be extremely altered and combative and agitated.  Because lumbar puncture was relatively  unrevealing her ampicillin her ceftriaxone was discontinued and her blood cultures did show MRSA.  ID was consulted and recommending transthoracic echocardiogram and repeating blood cultures.  Because she was persistently altered still an MRI was done which was abnormal.  MRI was reviewed by neurology who feels that she needs further neurological work-up and recommending transfer to [facility] for further evaluation recommendations.  Psychiatry saw the patient today and recommending resuming her Depakote and her Seroquel but patient has not been taking anything by mouth and continues to refuse all oral intake given her altered status.  Assessment & Plan:   Principal Problem:   Bipolar affective disorder, currently manic, mild (HCC) Active Problems:   Acute metabolic encephalopathy   Severe sepsis (HCC)   AF (paroxysmal atrial fibrillation) (HCC)   Sacral wound   Chronic diastolic CHF (congestive heart failure) (HCC)   Bipolar disorder (HCC)   SIRS (systemic inflammatory response syndrome) (HCC)   Nausea and vomiting   Mixed diabetic hyperlipidemia associated with type 2 diabetes mellitus (HCC)   Type 2 diabetes mellitus with hyperglycemia, with long-term current use of insulin (HCC)   Constipation   Encephalopathy acute   MRSA bacteremia  Acute Metabolic Encephalopathy, persistent  -Patient is exhibiting several days of progressively increasing agitation at the skilled nursing facility and poor oral intake. On arrival here, patient is actually becoming increasingly more more lethargic and minimally responsive but upon my evaluation this morning she is extremely agitated and significantly confused. -CT imaging of head in the emergency department unremarkable. -Home medication list reviewed, I do not believe that polypharmacy is the cause considering the sudden onset and progression of her symptoms. -Multiple  bouts of fevers in the emergency department is suggestive of an infectious etiology  causing patient's change in mentation.  -CT imaging of the chest abdomen pelvis has not identified a focus of infection.  -Urinalysis not suggestive of urinary tract infection. Lactic acid unremarkable. -Considering patient's progressive clinical course there was concern about the possibility of meningitis or encephalitis.  -Home regimen of Eliquis hadbeen held and and patient to under go lumbar puncture.  -LP done and was really not impressive  -Initially she was placed on empiric intravenous antibiotic regimen of IV ceftriaxone vancomycin and ampicillin. After discussion with infectious diseases Dr. Renold Don recommends adding empiric acyclovir and will continue. -ID Consulted for further evaluation and Recc's and recommending repeating blood cultures and transthoracic echocardiogram and if this was negative continue transesophageal echocardiogram; now ID has stopped ceftriaxone and ampicillin but recommending continue IV vancomycin -I discussed the case with neurology Dr. Wilford Corner who recommended lumbar puncture under fluoroscopy guidance given that neurologist do not perform procedures at [facility]  -Obtaining TSH, folate, vitamin B12 was normal at 211, ammonia was normal at 22, hepatic function panel unremarkable, RPR, CRP and urine toxicology screen additionally ordered and are pending. -Diet was tried to be advanced patient is not taking her pills at all -She continues to have significant behavioral disturbances and if necessary will need to run her IVC and psychiatry has been consulted for further evaluation recommendations see the patient again tomorrow but they have at this time recommending resuming Depakote 500 g p.o. twice daily for bipolar disorder and aggression and agitation resume Seroquel 250 mg p.o. nightly and continue neurological work-up at this time -Psychiatry evaluated continue restarting Depakote and Seroquel monitor for symptoms -MRI done today and showed "Ill-defined  patchy hyperintensity in the left occipital parietal lobe without restricted diffusion or abnormal enhancement. Differential diagnosis includes late subacute infarct, encephalitis. Tumor not considered likely. Correlate with symptoms." -I discussed the case with Neurology Dr. Wilford Corner who is evaluated the patient's MRI and feels that she will need further work-up and an EEG and recommends transfer to [facility] for a formal neuro evaluation  Sepsis in the setting of MRSA bacteremia with concern for encephalitis versus subacute CVA -Patient exhibiting leukocytosis and multiple fevers in the emergency department consistent with SIRS -Unclear source. CT chest abdomen pelvis has not identified a definitive source. Urinalysis not suggestive of urinary tract infection.  -Patient did exhibit several bouts of nausea and vomiting in the emergency department and therefore a viral gastroenteritis is possible. Alternatively, considering patient's worsening mentation meningitis or encephalitis are also a possibility and once patient to have apixaban held for lumbar puncture -Blood cultures and urine cultures have been obtained. -Hydrating patient with intravenous isotonic fluids -Given a dose of IV Decadron 10 mg x 1 and will not continue given her bacteremia -WBC is 11.8, CRP is 1.1, lactic acid level is 1.5 and repeat is 1.5, procalcitonin is less than 0.10 -Treating patient empirically with intravenous antibiotic therapy in case of potential bacterial meningitis -May need formal ID consultation if she is not improving and if lumbar puncture is unrevealing as-lumbar puncture to be done -Patient did not allow repeat blood work to be done today  Nausea and Vomiting, improved -Patient did exhibit several episodes of vomiting in the emergency department as well as at least one episode of loose stool. -Abdomen was questionably slightly tender on examination but she did not let me examine properly today. -Acute  gastroenteritis is one possible differential for the  patient's fevers which would hopefully be self-limiting if that was the case. -Continue to monitor for further recurrence, supportive care. -She has not vomited today  AF (paroxysmal atrial fibrillation) (HCC) -Was holding home regimen of Apixaban in preparation for possible lumbar puncture and once her lumbar puncture was done we will resume however patient not taking medication now -Currently rate controlled -Continue Monitoring patient on telemetry  Bipolar Disorder [facility]) -Patient typically takes Seroquel and valproic acid for her bipolar disorder has not taken it in several days -Psychiatry consulted and recommending resuming this currently and she has been resumed on 500 mg p.o. Depakote 500 mg p.o. twice daily as well as quetiapine 250 mg p.o. nightly -Appreciate psychiatric evaluation  Mixed diabetic hyperlipidemia associated with type 2 diabetes mellitus (HCC) -Holding home regimen of statin therapy as patient is refusing to take anything orally  Type 2 diabetes mellitus with hyperglycemia, with long-term current use of insulin (HCC) -C/w Accu-Cheks every 6 hours with sliding scale insulin while n.p.o. -Hemoglobin A1c was 6.1 -Continue to Monitor Blood Sugars Carefully. CBG's ranging from 83-1 51  Constipation -There was a 8 cm stool ball noted in the rectum on CT imaging of the abdomen and pelvis incidentally. -Once patient is more awake and alert will proceed with ordering enema such as smog enema. If that is unsuccessful then disimpaction can be attempted.  Sacral Wound -Sacral wound has substantially improved since it was a stage IV wound with concurrent osteomyelitis in 2021. Ulceration has essentially epithelialized. -Continuing daily dressing changes, wound care consultation placed -Present on admission.  Hyponatremia -Patient's sodium level is 133  -Currently getting normal saline at 125 mL's per hour for  16 hours and will reduce this to the normal saline at 75 MLS per hour for 1 more day -Continue to monitor and trend and repeat CMP in a.m however patient did not allow [place] to repeat her blood work today  Chronic Diastolic Congestive Heart Failure -No evidence of cardiogenic volume overload at this time.  -Will monitor for any evidence of volume overload as we hydrate the patient with intravenous fluids with normal saline at 125 mL's per hour for 16 hours and will reduce this to 75 MLS per hour and this was continued yesterday and will need to continue monitor volume status carefully  Obesity -Complicates overall prognosis and Cre -Estimated body mass index is 31.51 kg/m as calculated from the following:   Height as of 05/06/20:  (1.676 m).   Weight as of 10/12/20: 88.5 kg. -Patient not alert or with it enough to understand weight loss and dietary counseling  DVT prophylaxis: Anticoagulated with Apixaban Code Status: FULL CODE  Family Communication: No family present at bedside  Disposition Plan: Transfer to [facility] for further neurological evaluation  Status is: Inpatient  Remains inpatient appropriate because:Unsafe d/c plan, IV treatments appropriate due to intensity of illness or inability to take PO and Inpatient level of care appropriate due to severity of illness   Dispo: The patient is from: SNF              Anticipated d/c is to: SNF              Patient currently is not medically stable to d/c.   Difficult to place patient No   Consultants:   Psychiatry  Neurology  Infectious Diseases  Procedures:  ECHOCARDIOGRAM IMPRESSIONS    1. Limited study due to poor acoustic windows. Left ventricular ejection  fraction, by estimation, is 60  to 65%. The left ventricle has normal  function. The left ventricle has no regional wall motion abnormalities.  There is mild concentric left  ventricular hypertrophy. Left ventricular diastolic parameters are  consistent  with Grade I diastolic dysfunction (impaired relaxation).  2. Right ventricular systolic function is mildly reduced. The right  ventricular size is mildly-to-moderately enlarged.  3. The mitral valve is normal in structure. Trivial mitral valve  regurgitation. No evidence of mitral stenosis.  4. The aortic valve is tricuspid. There is mild calcification of the  aortic valve. There is mild thickening of the aortic valve. Aortic valve  regurgitation is not visualized. Mild aortic valve sclerosis is present,  with no evidence of aortic valve  stenosis.  5. No valvular vegetations visualized on surface echo. Pending clinical  suspicion for infective endocarditis, can consider TEE for further  evaluation.   Comparison(s): Compared to prior TTE on 12/23/19, the RV systolic function  appears mildly improved. Otherwise there is no significant change.   Conclusion(s)/Recommendation(s): No evidence of valvular vegetations on  this transthoracic echocardiogram. Would recommend a transesophageal  echocardiogram to exclude infective endocarditis if clinically indicated.   FINDINGS  Left Ventricle: Limited study due to poor acoustic windows. Left  ventricular ejection fraction, by estimation, is 60 to 65%. The left  ventricle has normal function. The left ventricle has no regional wall  motion abnormalities. Definity contrast agent  was given IV to delineate the left ventricular endocardial borders. The  left ventricular internal cavity size was normal in size. There is mild  concentric left ventricular hypertrophy. Left ventricular diastolic  parameters are consistent with Grade I  diastolic dysfunction (impaired relaxation).   Right Ventricle: The right ventricular size is mildly-to-moderately  enlarged. Right vetricular wall thickness was not well visualized. Right  ventricular systolic function is mildly reduced.   Left Atrium: Left atrial size was normal in size.   Right Atrium:  Right atrial size was normal in size.   Pericardium: There is no evidence of pericardial effusion.   Mitral Valve: The mitral valve is normal in structure. There is mild  thickening of the mitral valve leaflet(s). There is mild calcification of  the mitral valve leaflet(s). Trivial mitral valve regurgitation. No  evidence of mitral valve stenosis.   Tricuspid Valve: The tricuspid valve is normal in structure. Tricuspid  valve regurgitation is trivial.   Aortic Valve: The aortic valve is tricuspid. There is mild calcification  of the aortic valve. There is mild thickening of the aortic valve. Aortic  valve regurgitation is not visualized. Mild aortic valve sclerosis is  present, with no evidence of aortic  valve stenosis.   Pulmonic Valve: The pulmonic valve was not well visualized. Pulmonic valve  regurgitation is not visualized.   Aorta: The aortic root and ascending aorta are structurally normal, with  no evidence of dilitation.   IAS/Shunts: No atrial level shunt detected by color flow Doppler.     LEFT VENTRICLE  PLAX 2D  LVIDd:     4.26 cm  LVIDs:     2.71 cm  LV PW:     1.17 cm  LV IVS:    1.06 cm  LVOT diam:   1.80 cm  LVOT Area:   2.54 cm     RIGHT VENTRICLE  RV S prime:   9.90 cm/s   LEFT ATRIUM     Index  LA diam:  3.80 cm 1.92 cm/m    AORTA  Ao Root diam: 2.90 cm  Ao  Asc diam: 3.40 cm     SHUNTS  Systemic Diam: 1.80 cm   Antimicrobials:  Anti-infectives (From admission, onward)   Start     Dose/Rate Route Frequency Ordered Stop   11/18/20 2200  vancomycin (VANCOREADY) IVPB 1250 mg/250 mL        1,250 mg 166.7 mL/hr over 90 Minutes Intravenous Every 24 hours 11/17/20 2345     11/18/20 0000  ampicillin (OMNIPEN) 2 g in sodium chloride 0.9 % 100 mL IVPB  Status:  Discontinued        2 g 300 mL/hr over 20 Minutes Intravenous Every 4 hours 11/17/20 2319 11/18/20 1520   11/17/20 2345  vancomycin (VANCOCIN) IVPB 1000  mg/200 mL premix        1,000 mg 200 mL/hr over 60 Minutes Intravenous  Once 11/17/20 2331 11/18/20 0155   11/17/20 2330  cefTRIAXone (ROCEPHIN) 2 g in sodium chloride 0.9 % 100 mL IVPB  Status:  Discontinued        2 g 200 mL/hr over 30 Minutes Intravenous Every 12 hours 11/17/20 2319 11/18/20 1520       Subjective: Seen and examined at bedside and she was extremely confused daughter very agitated and upset.  She thought I visit her her in [place] years ago.  Also accused the nurse tech of sleeping with her husband.  Patient was very tangential and unable to provide a proper subjective history given her current condition.  She continues to refuse all p.o. intake and medications.  Also gets very upset and agitated and is somewhat paranoid.  No other concerns or complaints at this time.  Objective: Vitals:   11/18/20 2002 11/19/20 0452 11/19/20 0628 11/19/20 1502  BP: (!) 154/75 (!) 179/80 (!) 174/97 (!) 145/76  Pulse: 75 73 69 70  Resp: 17 17  18   Temp: 97.9 F (36.6 C) 98 F (36.7 C)    TempSrc: Oral Oral    SpO2: 99% 98%  94%    Intake/Output Summary (Last 24 hours) at 11/19/2020 1731 Last data filed at 11/19/2020 0600 Gross per 24 hour  Intake 1244.39 ml  Output 250 ml  Net 994.39 ml   There were no vitals filed for this visit.  Examination: Physical Exam:  Constitutional: WN/WD obese extreme paranoid and confused female who is agitated  Eyes: Lids and conjunctivae normal, sclerae anicteric  ENMT: External Ears, Nose appear normal. Grossly normal hearing. Neck: Appears normal, supple, no cervical masses, normal ROM, no appreciable thyromegaly; no appreciable JVD Respiratory: Diminished to auscultation bilaterally with coarse breath sounds, no wheezing, rales, rhonchi or crackles. Normal respiratory effort and patient is not tachypenic. No accessory muscle use.  Unlabored breathing Cardiovascular: RRR, no murmurs / rubs / gallops. S1 and S2 auscultated.  Minimal extremity  edema abdomen: Soft, non-tender, distended secondary body. Bowel sounds positive.  GU: Deferred. Musculoskeletal: No clubbing / cyanosis of digits/nails. No joint deformity upper and lower extremities.  Skin: No rashes, lesions, ulcers on limited skin evaluation. No induration; Warm and dry.  Neurologic: Does not follow commands to participate in a normal evaluation and very upset and belligerent Psychiatric: Impaired judgment and insight.  She is awake but she is not alert and not oriented x 3.  Agitated and paranoid mood and appropriate affect.   Data Reviewed: I have personally reviewed following labs and imaging studies  CBC: Recent Labs  Lab 11/17/20 1600 11/18/20 0154  WBC 12.0* 11.8*  NEUTROABS  --  7.4  HGB 13.4 12.7  HCT  42.0 39.7  MCV 89.6 89.8  PLT 248 227   Basic Metabolic Panel: Recent Labs  Lab 11/17/20 1600 11/18/20 0154  NA 135 133*  K 3.9 4.1  CL 97* 96*  CO2 26 27  GLUCOSE 152* 155*  BUN 17 18  CREATININE 0.51 0.69  CALCIUM 9.4 8.9  MG  --  1.7   GFR: CrCl cannot be calculated (Unknown ideal weight.). Liver Function Tests: Recent Labs  Lab 11/17/20 1600 11/18/20 0154  AST 13* 13*  ALT 15 13  ALKPHOS 54 43  BILITOT 0.9 0.6  PROT 7.6 6.6  ALBUMIN 3.7 3.3*   No results for input(s): LIPASE, AMYLASE in the last 168 hours. Recent Labs  Lab 11/18/20 0154  AMMONIA 22   Coagulation Profile: Recent Labs  Lab 11/18/20 1109  INR 1.1   Cardiac Enzymes: No results for input(s): CKTOTAL, CKMB, CKMBINDEX, TROPONINI in the last 168 hours. BNP (last 3 results) No results for input(s): PROBNP in the last 8760 hours. HbA1C: Recent Labs    11/18/20 0154  HGBA1C 6.1*   CBG: Recent Labs  Lab 11/18/20 1153 11/18/20 1633 11/18/20 2358 11/19/20 0631 11/19/20 1119  GLUCAP 151* 110* 105* 83 88   Lipid Profile: No results for input(s): CHOL, HDL, LDLCALC, TRIG, CHOLHDL, LDLDIRECT in the last 72 hours. Thyroid Function Tests: Recent Labs     11/18/20 0154  TSH 0.771   Anemia Panel: Recent Labs    11/18/20 0154  VITAMINB12 211  FOLATE 35.1   Sepsis Labs: Recent Labs  Lab 11/17/20 1600 11/17/20 1842 11/18/20 0154 11/18/20 1109  PROCALCITON <0.10  --  <0.10  --   LATICACIDVEN  --  1.4 1.5 1.5    Recent Results (from the past 240 hour(s))  Urine culture     Status: None   Collection Time: 11/17/20  5:14 PM   Specimen: Urine, Random  Result Value Ref Range Status   Specimen Description   Final    URINE, RANDOM Performed at [facility], [address], [place], [place] [address]    Special Requests   Final    NONE Performed at [facility], [address] [place], [place] [address]    Culture   Final    NO GROWTH Performed at [facility], [address], [place], [place] [address]    Report Status 11/19/2020 FINAL  Final  Culture, blood (routine x 2)     Status: None (Preliminary result)   Collection Time: 11/17/20  6:42 PM   Specimen: BLOOD  Result Value Ref Range Status   Specimen Description   Final    BLOOD LEFT ANTECUBITAL Performed at [facility], [address] [place], [place] [address]    Special Requests   Final    BOTTLES DRAWN AEROBIC AND ANAEROBIC Blood Culture adequate volume Performed at [facility], [address] [place], [place] [address]    Culture   Final    NO GROWTH 2 DAYS Performed at [facility], [address], [place], [place] [address]    Report Status PENDING  Incomplete  Culture, blood (routine x 2)     Status: Abnormal (Preliminary result)   Collection Time: 11/17/20  6:45 PM   Specimen: BLOOD LEFT FOREARM  Result Value Ref Range Status   Specimen Description   Final    BLOOD LEFT FOREARM Performed at [facility], [address] [place], [place] [address]  Special Requests   Final    BOTTLES DRAWN AEROBIC AND ANAEROBIC Blood Culture adequate  volume Performed at [facility], [address], [place], [place] [address]    Culture  Setup Time   Final    GRAM POSITIVE COCCI IN CLUSTERS AEROBIC BOTTLE ONLY CRITICAL RESULT CALLED TO, READ BACK BY AND VERIFIED WITH: PHARMD CHRISTINE SHADE ON 11/18/20 AT 1508 BY KJ Performed at [facility], [address] [place], [place] [address]    Culture STAPHYLOCOCCUS AUREUS (A)  Final   Report Status PENDING  Incomplete  Blood Culture ID Panel (Reflexed)     Status: Abnormal   Collection Time: 11/17/20  6:45 PM  Result Value Ref Range Status   Enterococcus faecalis NOT DETECTED NOT DETECTED Final   Enterococcus Faecium NOT DETECTED NOT DETECTED Final   Listeria monocytogenes NOT DETECTED NOT DETECTED Final   Staphylococcus species DETECTED (A) NOT DETECTED Final    Comment: CRITICAL RESULT CALLED TO, READ BACK BY AND VERIFIED WITH: PHARMD CHRISTINE SHADE ON 11/18/20 AT 1508 BY KJ    Staphylococcus aureus (BCID) DETECTED (A) NOT DETECTED Final    Comment: Methicillin (oxacillin)-resistant Staphylococcus aureus (MRSA). MRSA is predictably resistant to beta-lactam antibiotics (except ceftaroline). Preferred therapy is vancomycin unless clinically contraindicated. Patient requires contact precautions if  hospitalized. CRITICAL RESULT CALLED TO, READ BACK BY AND VERIFIED WITH: PHARMD CHRISTINE SHADE ON 11/18/20 AT 1508 BY KJ    Staphylococcus epidermidis NOT DETECTED NOT DETECTED Final   Staphylococcus lugdunensis NOT DETECTED NOT DETECTED Final   Streptococcus species NOT DETECTED NOT DETECTED Final   Streptococcus agalactiae NOT DETECTED NOT DETECTED Final   Streptococcus pneumoniae NOT DETECTED NOT DETECTED Final   Streptococcus pyogenes NOT DETECTED NOT DETECTED Final   A.calcoaceticus-baumannii NOT DETECTED NOT DETECTED Final   Bacteroides fragilis NOT DETECTED NOT DETECTED Final   Enterobacterales NOT DETECTED NOT DETECTED Final   Enterobacter cloacae  complex NOT DETECTED NOT DETECTED Final   Escherichia coli NOT DETECTED NOT DETECTED Final   Klebsiella aerogenes NOT DETECTED NOT DETECTED Final   Klebsiella oxytoca NOT DETECTED NOT DETECTED Final   Klebsiella pneumoniae NOT DETECTED NOT DETECTED Final   Proteus species NOT DETECTED NOT DETECTED Final   Salmonella species NOT DETECTED NOT DETECTED Final   Serratia marcescens NOT DETECTED NOT DETECTED Final   Haemophilus influenzae NOT DETECTED NOT DETECTED Final   Neisseria meningitidis NOT DETECTED NOT DETECTED Final   Pseudomonas aeruginosa NOT DETECTED NOT DETECTED Final   Stenotrophomonas maltophilia NOT DETECTED NOT DETECTED Final   Candida albicans NOT DETECTED NOT DETECTED Final   Candida auris NOT DETECTED NOT DETECTED Final   Candida glabrata NOT DETECTED NOT DETECTED Final   Candida krusei NOT DETECTED NOT DETECTED Final   Candida parapsilosis NOT DETECTED NOT DETECTED Final   Candida tropicalis NOT DETECTED NOT DETECTED Final   Cryptococcus neoformans/gattii NOT DETECTED NOT DETECTED Final   Meth resistant mecA/C and MREJ DETECTED (A) NOT DETECTED Final    Comment: CRITICAL RESULT CALLED TO, READ BACK BY AND VERIFIED WITH: PHARMD CHRISTINE SHADE ON 11/18/20 AT 1508 BY KJ Performed at [facility], [address] [place], [place] [address]   Resp Panel by RT-PCR (Flu A&B, Covid) Nasopharyngeal Swab     Status: None   Collection Time: 11/17/20  7:12 PM   Specimen: Nasopharyngeal Swab; Nasopharyngeal(NP) swabs in vial transport medium  Result Value Ref Range Status   SARS Coronavirus 2 by RT PCR NEGATIVE NEGATIVE  Final    Comment: (NOTE) SARS-CoV-2 target nucleic acids are NOT DETECTED.  The SARS-CoV-2 RNA is generally detectable in upper respiratory specimens during the acute phase of infection. The lowest concentration of SARS-CoV-2 viral copies this assay can detect is 138 copies/mL. A negative result does not preclude SARS-Cov-2 infection and should not be  used as the sole basis for treatment or other patient management decisions. A negative result may occur with  improper specimen collection/handling, submission of specimen other than nasopharyngeal swab, presence of viral mutation(s) within the areas targeted by this assay, and inadequate number of viral copies(<138 copies/mL). A negative result must be combined with clinical observations, patient history, and epidemiological information. The expected result is Negative.  Fact Sheet for Patients:  BloggerCourse.com  Fact Sheet for Healthcare Providers:  SeriousBroker.it  This test is no t yet approved or cleared by the [place] FDA and  has been authorized for detection and/or diagnosis of SARS-CoV-2 by FDA under an Emergency Use Authorization (EUA). This EUA will remain  in effect (meaning this test can be used) for the duration of the COVID-19 declaration under Section 564(b)(1) of the Act, 21 U.S.C.section 360bbb-3(b)(1), unless the authorization is terminated  or revoked sooner.       Influenza A by PCR NEGATIVE NEGATIVE Final   Influenza B by PCR NEGATIVE NEGATIVE Final    Comment: (NOTE) The Xpert Xpress SARS-CoV-2/FLU/RSV plus assay is intended as an aid in the diagnosis of influenza from Nasopharyngeal swab specimens and should not be used as a sole basis for treatment. Nasal washings and aspirates are unacceptable for Xpert Xpress SARS-CoV-2/FLU/RSV testing.  Fact Sheet for Patients: BloggerCourse.com  Fact Sheet for Healthcare Providers: SeriousBroker.it  This test is not yet approved or cleared by the [place] FDA and has been authorized for detection and/or diagnosis of SARS-CoV-2 by FDA under an Emergency Use Authorization (EUA). This EUA will remain in effect (meaning this test can be used) for the duration of the COVID-19 declaration under Section  564(b)(1) of the Act, 21 U.S.C. section 360bbb-3(b)(1), unless the authorization is terminated or revoked.  Performed at [facility], [address], [place], [place] [address]   CSF culture     Status: None (Preliminary result)   Collection Time: 11/18/20  1:56 PM   Specimen: Lumbar Puncture; Cerebrospinal Fluid  Result Value Ref Range Status   Specimen Description CSF  Final   Special Requests LP  Final   Gram Stain   Final    WBC PRESENT, PREDOMINANTLY MONONUCLEAR NO ORGANISMS SEEN CYTOSPIN SMEAR Gram Stain Report Called to,Read Back By and Verified With: H.JENKINS, RN AT 1614 ON 04.08.22 BY N.THOMPSON Performed at [facility], [address], [place], [place] [address]    Culture PENDING  Incomplete   Report Status PENDING  Incomplete  Culture, blood (routine x 2)     Status: None (Preliminary result)   Collection Time: 11/18/20  7:57 PM   Specimen: BLOOD  Result Value Ref Range Status   Specimen Description   Final    BLOOD RIGHT ANTECUBITAL Performed at [facility], [address], [place], [place] [address]    Special Requests   Final    BOTTLES DRAWN AEROBIC ONLY Blood Culture adequate volume Performed at [facility], [address], [place], [place] [address]    Culture   Final    NO GROWTH < 12 HOURS Performed at [facility], [address] [place], [place] [address]    Report Status PENDING  Incomplete  Culture, blood (routine x 2)     Status: None (Preliminary result)   Collection Time: 11/18/20  7:57 PM   Specimen: BLOOD RIGHT HAND  Result Value Ref Range Status   Specimen Description   Final    BLOOD RIGHT HAND Performed at [facility], [address] [place], [place] [address]    Special Requests   Final    BOTTLES DRAWN AEROBIC ONLY Blood Culture adequate volume Performed at [facility], [address], [place], [place]  [address]    Culture   Final    NO GROWTH < 12 HOURS Performed at [facility], 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 (Active)  12/20/19 2030  Location: Sacrum  Location Orientation: Mid  Staging: Stage 4 - Full thickness tissue loss with exposed bone, tendon or muscle.  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 (Active)  05/09/20   Location: Sacrum  Location Orientation: Mid  Staging: Stage 4 - Full thickness tissue loss with exposed bone, tendon or muscle.  Wound Description (Comments):   Present on Admission: Yes   Estimated body mass index is 31.51 kg/m as calculated from the following:   Height as of 05/06/20: 5\' 6"  (1.676 m).   Weight as of 10/12/20: 88.5 kg.  Malnutrition Type:   Malnutrition Characteristics:   Nutrition Interventions:   Radiology Studies: CT Chest W Contrast  Result Date: 11/17/2020 CLINICAL DATA:  Fever, altered mental status.  Nausea and vomiting. EXAM: CT CHEST, ABDOMEN, AND PELVIS WITH CONTRAST TECHNIQUE: Multidetector CT imaging of the chest, abdomen and pelvis was performed following the standard protocol during bolus administration of intravenous contrast. CONTRAST:  OMNIPAQUE IOHEXOL 300 MG/ML  SOLN COMPARISON:  CT abdomen pelvis 12/20/2019, CT angiography chest 12/24/2019 FINDINGS: CT CHEST FINDINGS Cardiovascular: Normal heart size. No significant pericardial effusion. The thoracic aorta is normal in caliber. Mild atherosclerotic plaque of the thoracic aorta. The main pulmonary artery measures at the upper limits of normal: 3.2 cm. Mediastinum/Nodes: No enlarged mediastinal, hilar, or axillary lymph nodes. Thyroid gland,  trachea, and esophagus demonstrate no significant findings. Lungs/Pleura: Right lower lobe atelectasis with persistent elevated right hemidiaphragm. Limited evaluation for pulmonary nodule due to respiratory motion artifact. No pulmonary mass. No focal consolidation. No pleural effusion. No pneumothorax. Musculoskeletal: No chest wall abnormality. No suspicious lytic or blastic osseous lesions. No acute displaced fracture. Multilevel degenerative changes of the spine. CT ABDOMEN PELVIS FINDINGS Hepatobiliary: No focal liver abnormality. Gallstone noted within the gallbladder lumen. No gallbladder wall thickening or pericholecystic fluid. No biliary dilatation. Pancreas: No focal lesion. Normal pancreatic contour. No surrounding inflammatory changes. No main pancreatic ductal dilatation. Spleen: Normal in size without focal abnormality. Adrenals/Urinary Tract: No adrenal nodule bilaterally. Bilateral kidneys enhance symmetrically. Exophytic hypodense 2.2 cm lesion within the right kidney likely represent simple renal cysts. Slightly increased density likely due to streak artifact at this level. No hydronephrosis. No hydroureter. The urinary bladder is unremarkable. On delayed imaging, there is no urothelial wall thickening and there are no filling defects in the opacified portions of the  bilateral collecting systems or ureters. Stomach/Bowel: Stomach is within normal limits. No evidence of bowel wall thickening or dilatation. 8 cm stool ball within the rectum. The appendix not definitely identified. Vascular/Lymphatic: No abdominal aorta or iliac aneurysm. Mild atherosclerotic plaque of the aorta and its branches. No abdominal, pelvic, or inguinal lymphadenopathy. Reproductive: Difficult to measure grossly stable uterine fibroids. No adnexal lesion. Other: No intraperitoneal free fluid. No intraperitoneal free gas. No organized fluid collection. Musculoskeletal: Tiny fat containing left inguinal hernia. No suspicious  lytic or blastic osseous lesions. No acute displaced fracture. Multilevel degenerative changes of the spine. IMPRESSION: 1. Main pulmonary artery measures at the upper limits of normal. Correlate with pulmonary hypertension. 2. Otherwise no acute intrathoracic abnormality. 3. Cholelithiasis with no CT findings of acute cholecystitis. 4. An 8 cm stool ball within the rectum with no associated findings suggest of stercoral colitis. 5. 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 Electronically Signed   By: Tish Frederickson M.D.   On: 11/17/2020 20:07   MR BRAIN W WO CONTRAST  Result Date: 11/19/2020 CLINICAL DATA:  Mental status change.  Encephalopathy EXAM: MRI HEAD WITHOUT AND WITH CONTRAST TECHNIQUE: Multiplanar, multiecho pulse sequences of the brain and surrounding structures were obtained without and with intravenous contrast. CONTRAST:  7.36mL GADAVIST GADOBUTROL 1 MMOL/ML IV SOLN COMPARISON:  CT head 11/17/2020.  MRI head 12/24/2019 FINDINGS: Brain: Patchy areas of hyperintensity in the left occipital pole extending into the left posterior parietal lobe best seen on FLAIR. This involves primarily the white matter but some areas of cortex. This area does not show restricted diffusion or susceptibility. This area was normal on the prior MRI 2021. No abnormal enhancement. Moderate atrophy. Mild white matter changes consistent with chronic microvascular ischemia. Diffusion-weighted imaging negative for acute infarct. No hemorrhage or mass lesion. Vascular: Normal arterial flow voids Skull and upper cervical spine: Negative Sinuses/Orbits: Mild mucosal edema paranasal sinuses. Negative orbit Other: None IMPRESSION: Ill-defined patchy hyperintensity in the left occipital parietal lobe without restricted diffusion or abnormal enhancement. Differential diagnosis includes late subacute infarct, encephalitis. Tumor not considered likely. Correlate with symptoms. Electronically Signed   By: Marlan Palau M.D.   On:  11/19/2020 15:24   CT Abdomen Pelvis W Contrast  Result Date: 11/17/2020 CLINICAL DATA:  Fever, altered mental status.  Nausea and vomiting. EXAM: CT CHEST, ABDOMEN, AND PELVIS WITH CONTRAST TECHNIQUE: Multidetector CT imaging of the chest, abdomen and pelvis was performed following the standard protocol during bolus administration of intravenous contrast. CONTRAST:  OMNIPAQUE IOHEXOL 300 MG/ML  SOLN COMPARISON:  CT abdomen pelvis 12/20/2019, CT angiography chest 12/24/2019 FINDINGS: CT CHEST FINDINGS Cardiovascular: Normal heart size. No significant pericardial effusion. The thoracic aorta is normal in caliber. Mild atherosclerotic plaque of the thoracic aorta. The main pulmonary artery measures at the upper limits of normal: 3.2 cm. Mediastinum/Nodes: No enlarged mediastinal, hilar, or axillary lymph nodes. Thyroid gland, trachea, and esophagus demonstrate no significant findings. Lungs/Pleura: Right lower lobe atelectasis with persistent elevated right hemidiaphragm. Limited evaluation for pulmonary nodule due to respiratory motion artifact. No pulmonary mass. No focal consolidation. No pleural effusion. No pneumothorax. Musculoskeletal: No chest wall abnormality. No suspicious lytic or blastic osseous lesions. No acute displaced fracture. Multilevel degenerative changes of the spine. CT ABDOMEN PELVIS FINDINGS Hepatobiliary: No focal liver abnormality. Gallstone noted within the gallbladder lumen. No gallbladder wall thickening or pericholecystic fluid. No biliary dilatation. Pancreas: No focal lesion. Normal pancreatic contour. No surrounding inflammatory changes. No main pancreatic ductal dilatation. Spleen: Normal in  size without focal abnormality. Adrenals/Urinary Tract: No adrenal nodule bilaterally. Bilateral kidneys enhance symmetrically. Exophytic hypodense 2.2 cm lesion within the right kidney likely represent simple renal cysts. Slightly increased density likely due to streak artifact at this  level. No hydronephrosis. No hydroureter. The urinary bladder is unremarkable. On delayed imaging, there is no urothelial wall thickening and there are no filling defects in the opacified portions of the bilateral collecting systems or ureters. Stomach/Bowel: Stomach is within normal limits. No evidence of bowel wall thickening or dilatation. 8 cm stool ball within the rectum. The appendix not definitely identified. Vascular/Lymphatic: No abdominal aorta or iliac aneurysm. Mild atherosclerotic plaque of the aorta and its branches. No abdominal, pelvic, or inguinal lymphadenopathy. Reproductive: Difficult to measure grossly stable uterine fibroids. No adnexal lesion. Other: No intraperitoneal free fluid. No intraperitoneal free gas. No organized fluid collection. Musculoskeletal: Tiny fat containing left inguinal hernia. No suspicious lytic or blastic osseous lesions. No acute displaced fracture. Multilevel degenerative changes of the spine. IMPRESSION: 1. Main pulmonary artery measures at the upper limits of normal. Correlate with pulmonary hypertension. 2. Otherwise no acute intrathoracic abnormality. 3. Cholelithiasis with no CT findings of acute cholecystitis. 4. An 8 cm stool ball within the rectum with no associated findings suggest of stercoral colitis. 5. 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 Electronically Signed   By: Tish Frederickson M.D.   On: 11/17/2020 20:07   ECHOCARDIOGRAM COMPLETE  Result Date: 11/19/2020    ECHOCARDIOGRAM REPORT   Patient Name:   LOYAL RUDY Date of Exam: 11/19/2020 Medical Rec #:  [id number]      Height:       66.0 in Accession #:    [id number]     Weight:       195.2 lb Date of Birth:  Apr 21, 1946       BSA:          1.979 m Patient Age:    74 years       BP:           145/76 mmHg Patient Gender: F              HR:           75 bpm. Exam Location:  Inpatient Procedure: 2D Echo Indications:    bacteremia  History:        Patient has prior history of Echocardiogram examinations,  most                 recent 12/23/2019. CHF, Arrythmias:Atrial Fibrillation,                 Signs/Symptoms:Bacteremia; Risk Factors:Diabetes.  Sonographer:    Delcie Roch Referring Phys: [id number] VZDGL LATIF [facility]  Sonographer Comments: Image acquisition challenging due to uncooperative patient and Image acquisition challenging due to patient body habitus. IMPRESSIONS  1. Limited study due to poor acoustic windows. Left ventricular ejection fraction, by estimation, is 60 to 65%. The left ventricle has normal function. The left ventricle has no regional wall motion abnormalities. There is mild concentric left ventricular hypertrophy. Left ventricular diastolic parameters are consistent with Grade I diastolic dysfunction (impaired relaxation).  2. Right ventricular systolic function is mildly reduced. The right ventricular size is mildly-to-moderately enlarged.  3. The mitral valve is normal in structure. Trivial mitral valve regurgitation. No evidence of mitral stenosis.  4. The aortic valve is tricuspid. There is mild calcification of the aortic valve. There is mild thickening of the aortic valve. Aortic valve regurgitation is not  visualized. Mild aortic valve sclerosis is present, with no evidence of aortic valve stenosis.  5. No valvular vegetations visualized on surface echo. Pending clinical suspicion for infective endocarditis, can consider TEE for further evaluation. Comparison(s): Compared to prior TTE on 12/23/19, the RV systolic function appears mildly improved. Otherwise there is no significant change. Conclusion(s)/Recommendation(s): No evidence of valvular vegetations on this transthoracic echocardiogram. Would recommend a transesophageal echocardiogram to exclude infective endocarditis if clinically indicated. FINDINGS  Left Ventricle: Limited study due to poor acoustic windows. Left ventricular ejection fraction, by estimation, is 60 to 65%. The left ventricle has normal function. The left  ventricle has no regional wall motion abnormalities. Definity contrast agent was given IV to delineate the left ventricular endocardial borders. The left ventricular internal cavity size was normal in size. There is mild concentric left ventricular hypertrophy. Left ventricular diastolic parameters are consistent with Grade I diastolic dysfunction (impaired relaxation). Right Ventricle: The right ventricular size is mildly-to-moderately enlarged. Right vetricular wall thickness was not well visualized. Right ventricular systolic function is mildly reduced. Left Atrium: Left atrial size was normal in size. Right Atrium: Right atrial size was normal in size. Pericardium: There is no evidence of pericardial effusion. Mitral Valve: The mitral valve is normal in structure. There is mild thickening of the mitral valve leaflet(s). There is mild calcification of the mitral valve leaflet(s). Trivial mitral valve regurgitation. No evidence of mitral valve stenosis. Tricuspid Valve: The tricuspid valve is normal in structure. Tricuspid valve regurgitation is trivial. Aortic Valve: The aortic valve is tricuspid. There is mild calcification of the aortic valve. There is mild thickening of the aortic valve. Aortic valve regurgitation is not visualized. Mild aortic valve sclerosis is present, with no evidence of aortic valve stenosis. Pulmonic Valve: The pulmonic valve was not well visualized. Pulmonic valve regurgitation is not visualized. Aorta: The aortic root and ascending aorta are structurally normal, with no evidence of dilitation. IAS/Shunts: No atrial level shunt detected by color flow Doppler.  LEFT VENTRICLE PLAX 2D LVIDd:         4.26 cm LVIDs:         2.71 cm LV PW:         1.17 cm LV IVS:        1.06 cm LVOT diam:     1.80 cm LVOT Area:     2.54 cm  RIGHT VENTRICLE RV S prime:     9.90 cm/s LEFT ATRIUM         Index LA diam:    3.80 cm 1.92 cm/m   AORTA Ao Root diam: 2.90 cm Ao Asc diam:  3.40 cm  SHUNTS Systemic  Diam: 1.80 cm Laurance Flatten MD Electronically signed by Laurance Flatten MD Signature Date/Time: 11/19/2020/5:00:20 PM    Final    DG FL GUIDED LUMBAR PUNCTURE  Result Date: 11/18/2020 CLINICAL DATA:  Encephalopathy.  Fever.  Rule out meningitis EXAM: DIAGNOSTIC LUMBAR PUNCTURE UNDER FLUOROSCOPIC GUIDANCE COMPARISON:  CT abdomen pelvis 11/17/2020 FLUOROSCOPY TIME:  Fluoroscopy Time:  0 minutes 48 seconds Radiation Exposure Index (if provided by the fluoroscopic device): Number of Acquired Spot Images: 1 PROCEDURE: Patient was sedated prior to the study. The patient was uncooperative. Informed consent was obtained from the patient's brother by telephone prior to the procedure, including potential complications of headache, allergy, and pain. With the patient prone, the lower back was prepped with Betadine. 1% Lidocaine was used for local anesthesia. Lumbar puncture was performed at the L2-3 level using a 20 gauge  needle with return of clear CSF with an opening pressure of 13 cm water. Six ml of CSF were obtained for laboratory studies. The patient tolerated the procedure well and there were no apparent complications. IMPRESSION: Successful lumbar puncture.  Clear CSF. Electronically Signed   By: Marlan Palau M.D.   On: 11/18/2020 14:20   Scheduled Meds: . apixaban  5 mg Oral BID  . divalproex  500 mg Oral BID  . insulin aspart  0-15 Units Subcutaneous Q6H  . QUEtiapine  200 mg Oral QHS  . QUEtiapine  50 mg Oral QHS   Continuous Infusions: . vancomycin 1,250 mg (11/18/20 2237)    LOS: 1 day   Merlene Laughter, DO [facility] PAGER is on AMION  If 7PM-7AM, please contact night-coverage [email or web address]

## 2020-11-20 ENCOUNTER — Inpatient Hospital Stay (HOSPITAL_COMMUNITY): Payer: Medicare Other

## 2020-11-20 DIAGNOSIS — R7881 Bacteremia: Secondary | ICD-10-CM

## 2020-11-20 DIAGNOSIS — G9341 Metabolic encephalopathy: Secondary | ICD-10-CM | POA: Diagnosis not present

## 2020-11-20 DIAGNOSIS — F3111 Bipolar disorder, current episode manic without psychotic features, mild: Secondary | ICD-10-CM

## 2020-11-20 DIAGNOSIS — G9340 Encephalopathy, unspecified: Secondary | ICD-10-CM | POA: Diagnosis not present

## 2020-11-20 DIAGNOSIS — F3160 Bipolar disorder, current episode mixed, unspecified: Secondary | ICD-10-CM

## 2020-11-20 DIAGNOSIS — B9562 Methicillin resistant Staphylococcus aureus infection as the cause of diseases classified elsewhere: Secondary | ICD-10-CM

## 2020-11-20 DIAGNOSIS — I480 Paroxysmal atrial fibrillation: Secondary | ICD-10-CM

## 2020-11-20 DIAGNOSIS — I5032 Chronic diastolic (congestive) heart failure: Secondary | ICD-10-CM

## 2020-11-20 DIAGNOSIS — S31000A Unspecified open wound of lower back and pelvis without penetration into retroperitoneum, initial encounter: Secondary | ICD-10-CM

## 2020-11-20 LAB — CULTURE, BLOOD (ROUTINE X 2): Special Requests: ADEQUATE

## 2020-11-20 LAB — SEDIMENTATION RATE: Sed Rate: 7 mm/hr (ref 0–22)

## 2020-11-20 LAB — C-REACTIVE PROTEIN: CRP: 1.1 mg/dL — ABNORMAL HIGH (ref <1.0)

## 2020-11-20 LAB — GLUCOSE, CAPILLARY
Glucose-Capillary: 173 mg/dL — ABNORMAL HIGH (ref 70–99)
Glucose-Capillary: 86 mg/dL (ref 70–99)
Glucose-Capillary: 92 mg/dL (ref 70–99)

## 2020-11-20 MED ORDER — HYDRALAZINE HCL 20 MG/ML IJ SOLN
10.0000 mg | Freq: Four times a day (QID) | INTRAMUSCULAR | Status: DC | PRN
  Administered 2020-11-20 – 2020-12-01 (×3): 10 mg via INTRAVENOUS
  Filled 2020-11-20 (×3): qty 1

## 2020-11-20 MED ORDER — ADULT MULTIVITAMIN W/MINERALS CH
1.0000 | ORAL_TABLET | Freq: Every day | ORAL | Status: DC
  Administered 2020-11-22 – 2020-12-02 (×10): 1 via ORAL
  Filled 2020-11-20 (×11): qty 1

## 2020-11-20 MED ORDER — LORAZEPAM 2 MG/ML IJ SOLN
1.0000 mg | Freq: Once | INTRAMUSCULAR | Status: AC
  Administered 2020-11-20: 1 mg via INTRAVENOUS
  Filled 2020-11-20: qty 1

## 2020-11-20 MED ORDER — CLONIDINE HCL 0.2 MG/24HR TD PTWK
0.2000 mg | MEDICATED_PATCH | TRANSDERMAL | Status: DC
  Administered 2020-11-20: 0.2 mg via TRANSDERMAL
  Filled 2020-11-20: qty 1

## 2020-11-20 MED ORDER — SODIUM CHLORIDE 0.9 % IV SOLN
25.0000 mg | Freq: Once | INTRAVENOUS | Status: AC
  Administered 2020-11-20: 25 mg via INTRAVENOUS
  Filled 2020-11-20: qty 0.5

## 2020-11-20 MED ORDER — BISACODYL 10 MG RE SUPP
10.0000 mg | Freq: Every day | RECTAL | Status: AC
  Filled 2020-11-20: qty 1

## 2020-11-20 MED ORDER — AMLODIPINE BESYLATE 10 MG PO TABS
10.0000 mg | ORAL_TABLET | Freq: Every day | ORAL | Status: DC
  Administered 2020-11-22: 10 mg via ORAL
  Filled 2020-11-20 (×3): qty 1

## 2020-11-20 MED ORDER — METOPROLOL TARTRATE 5 MG/5ML IV SOLN
5.0000 mg | Freq: Once | INTRAVENOUS | Status: AC
  Administered 2020-11-20: 5 mg via INTRAVENOUS

## 2020-11-20 MED ORDER — PANTOPRAZOLE SODIUM 40 MG PO TBEC
40.0000 mg | DELAYED_RELEASE_TABLET | Freq: Every day | ORAL | Status: DC
  Administered 2020-11-22 – 2020-12-02 (×10): 40 mg via ORAL
  Filled 2020-11-20 (×12): qty 1

## 2020-11-20 MED ORDER — DOCUSATE SODIUM 100 MG PO CAPS
100.0000 mg | ORAL_CAPSULE | Freq: Two times a day (BID) | ORAL | Status: DC
  Filled 2020-11-20 (×2): qty 1

## 2020-11-20 MED ORDER — SODIUM CHLORIDE 0.9 % IV SOLN
8.0000 mg/kg | Freq: Every day | INTRAVENOUS | Status: DC
  Administered 2020-11-20 – 2020-12-01 (×12): 700 mg via INTRAVENOUS
  Filled 2020-11-20 (×13): qty 14

## 2020-11-20 MED ORDER — METOPROLOL TARTRATE 5 MG/5ML IV SOLN: 5.0000 mg | Freq: Once | INTRAVENOUS | Status: DC

## 2020-11-20 MED ORDER — POLYETHYLENE GLYCOL 3350 17 G PO PACK
17.0000 g | PACK | Freq: Two times a day (BID) | ORAL | Status: DC
  Administered 2020-11-20 – 2020-12-02 (×18): 17 g via ORAL
  Filled 2020-11-20 (×24): qty 1

## 2020-11-20 MED ORDER — ATORVASTATIN CALCIUM 10 MG PO TABS
10.0000 mg | ORAL_TABLET | Freq: Every day | ORAL | Status: DC
  Administered 2020-11-22 – 2020-12-02 (×10): 10 mg via ORAL
  Filled 2020-11-20 (×11): qty 1

## 2020-11-20 MED ORDER — HALOPERIDOL LACTATE 5 MG/ML IJ SOLN
2.0000 mg | Freq: Once | INTRAMUSCULAR | Status: DC
  Filled 2020-11-20: qty 1

## 2020-11-20 MED ORDER — HALOPERIDOL LACTATE 5 MG/ML IJ SOLN
2.0000 mg | Freq: Four times a day (QID) | INTRAMUSCULAR | Status: DC | PRN
  Administered 2020-11-20 – 2020-11-21 (×3): 2 mg via INTRAVENOUS
  Filled 2020-11-20 (×2): qty 1

## 2020-11-20 NOTE — Progress Notes (Signed)
    [facility] has been requested to perform a transesophageal echocardiogram on [facility] for bacteremia.  After careful review of history and examination, the risks and benefits of transesophageal echocardiogram have been explained to the brother acting as POA, including risks of esophageal damage, perforation (1:10,000 risk), bleeding, pharyngeal hematoma as well as other potential complications associated with conscious sedation including aspiration, arrhythmia, respiratory failure and death. Alternatives to treatment were discussed, questions were answered. Family is willing to proceed.   Georgie Chard, NP  11/20/2020 11:39 AM

## 2020-11-20 NOTE — Progress Notes (Addendum)
Subjective:  Multiple complaints but they all seem fairly delusional  Antibiotics:  Anti-infectives (From admission, onward)   Start     Dose/Rate Route Frequency Ordered Stop   11/18/20 2200  vancomycin (VANCOREADY) IVPB 1250 mg/250 mL        1,250 mg 166.7 mL/hr over 90 Minutes Intravenous Every 24 hours 11/17/20 2345     11/18/20 0000  ampicillin (OMNIPEN) 2 g in sodium chloride 0.9 % 100 mL IVPB  Status:  Discontinued        2 g 300 mL/hr over 20 Minutes Intravenous Every 4 hours 11/17/20 2319 11/18/20 1520   11/17/20 2345  vancomycin (VANCOCIN) IVPB 1000 mg/200 mL premix        1,000 mg 200 mL/hr over 60 Minutes Intravenous  Once 11/17/20 2331 11/18/20 0155   11/17/20 2330  cefTRIAXone (ROCEPHIN) 2 g in sodium chloride 0.9 % 100 mL IVPB  Status:  Discontinued        2 g 200 mL/hr over 30 Minutes Intravenous Every 12 hours 11/17/20 2319 11/18/20 1520      Medications: Scheduled Meds: . amLODipine  10 mg Oral Daily  . apixaban  5 mg Oral BID  . atorvastatin  10 mg Oral Daily  . bisacodyl  10 mg Rectal Daily  . cloNIDine  0.2 mg Transdermal Weekly  . divalproex  500 mg Oral BID  . docusate sodium  100 mg Oral BID  . haloperidol lactate  2 mg Intravenous Once  . insulin aspart  0-15 Units Subcutaneous Q6H  . multivitamin with minerals  1 tablet Oral Daily  . pantoprazole  40 mg Oral Daily  . polyethylene glycol  17 g Oral BID  . QUEtiapine  200 mg Oral QHS  . QUEtiapine  50 mg Oral QHS   Continuous Infusions: . vancomycin 1,250 mg (11/19/20 2315)   PRN Meds:.acetaminophen **OR** acetaminophen, haloperidol lactate, hydrALAZINE, [DISCONTINUED] ondansetron **OR** ondansetron (ZOFRAN) IV, QUEtiapine    Objective: Weight change:   Intake/Output Summary (Last 24 hours) at 11/20/2020 1631 Last data filed at 11/20/2020 1406 Gross per 24 hour  Intake 490.08 ml  Output 500 ml  Net -9.92 ml   Blood pressure (!) 179/80, pulse 96, temperature 98.2 F (36.8 C),  temperature source Oral, resp. rate 20, SpO2 100 %. Temp:  [97.6 F (36.4 C)-98.3 F (36.8 C)] 98.2 F (36.8 C) (04/10 1149) Pulse Rate:  [47-96] 96 (04/10 1149) Resp:  [18-20] 20 (04/10 1149) BP: (160-186)/(77-97) 179/80 (04/10 1149) SpO2:  [98 %-100 %] 100 % (04/10 1149)  Physical Exam: Physical Exam Constitutional:      General: She is not in acute distress.    Appearance: She is well-developed. She is not diaphoretic.  HENT:     Head: Normocephalic and atraumatic.     Right Ear: External ear normal.     Left Ear: External ear normal.     Mouth/Throat:     Pharynx: No oropharyngeal exudate.  Eyes:     General: No scleral icterus.    Extraocular Movements: Extraocular movements intact.     Conjunctiva/sclera: Conjunctivae normal.  Cardiovascular:     Rate and Rhythm: Normal rate and regular rhythm.  Pulmonary:     Effort: Pulmonary effort is normal. No respiratory distress.     Breath sounds: Normal breath sounds. No wheezing.  Abdominal:     General: Bowel sounds are normal. There is no distension.     Palpations: Abdomen is soft.  Musculoskeletal:  General: No tenderness. Normal range of motion.  Lymphadenopathy:     Cervical: No cervical adenopathy.  Skin:    General: Skin is warm and dry.     Coloration: Skin is not pale.     Findings: No erythema or rash.  Neurological:     Mental Status: She is alert.     Motor: No abnormal muscle tone.     Coordination: Coordination normal.  Psychiatric:        Mood and Affect: Mood is anxious. Affect is inappropriate.        Speech: Speech is tangential.        Thought Content: Thought content is paranoid and delusional.        Cognition and Memory: Cognition is impaired.      CBC:    BMET Recent Labs    11/18/20 0154 11/19/20 1933  NA 133* 139  K 4.1 4.2  CL 96* 104  CO2 27 25  GLUCOSE 155* 83  BUN 18 19  CREATININE 0.69 0.55  CALCIUM 8.9 8.8*     Liver Panel  Recent Labs    11/18/20 0154  11/19/20 1933  PROT 6.6 6.5  ALBUMIN 3.3* 3.2*  AST 13* 18  ALT 13 12  ALKPHOS 43 42  BILITOT 0.6 1.2       Sedimentation Rate Recent Labs    11/20/20 0208  ESRSEDRATE 7   C-Reactive Protein Recent Labs    11/18/20 0154 11/20/20 0208  CRP 1.1* 1.1*    Micro Results: Recent Results (from the past 720 hour(s))  Urine culture     Status: None   Collection Time: 11/17/20  5:14 PM   Specimen: Urine, Random  Result Value Ref Range Status   Specimen Description   Final    URINE, RANDOM Performed at [facility], [address] [place], [place] [address]    Special Requests   Final    NONE Performed at [facility], [address], [place], [place] [address]    Culture   Final    NO GROWTH Performed at [facility], [address] [place], [place] [address]    Report Status 11/19/2020 FINAL  Final  Culture, blood (routine x 2)     Status: None (Preliminary result)   Collection Time: 11/17/20  6:42 PM   Specimen: BLOOD  Result Value Ref Range Status   Specimen Description   Final    BLOOD LEFT ANTECUBITAL Performed at [facility], [address] [place], [place] [address]    Special Requests   Final    BOTTLES DRAWN AEROBIC AND ANAEROBIC Blood Culture adequate volume Performed at [facility], [address], [place], [place] [address]    Culture   Final    NO GROWTH 3 DAYS Performed at [facility], [address], [place], [place] [address]    Report Status PENDING  Incomplete  Culture, blood (routine x 2)     Status: Abnormal   Collection Time: 11/17/20  6:45 PM   Specimen: BLOOD LEFT FOREARM  Result Value Ref Range Status   Specimen Description   Final    BLOOD LEFT FOREARM Performed at [facility], [address], [place], [place] [address]    Special Requests   Final    BOTTLES DRAWN AEROBIC AND ANAEROBIC Blood Culture adequate  volume Performed at [facility], [address], [place], [place] [address]  Culture  Setup Time   Final    GRAM POSITIVE COCCI IN CLUSTERS AEROBIC BOTTLE ONLY CRITICAL RESULT CALLED TO, READ BACK BY AND VERIFIED WITH: PHARMD CHRISTINE SHADE ON 11/18/20 AT 1508 BY KJ Performed at [facility], [address], [place], [place] [address]    Culture METHICILLIN RESISTANT STAPHYLOCOCCUS AUREUS (A)  Final   Report Status 11/20/2020 FINAL  Final   Organism ID, Bacteria METHICILLIN RESISTANT STAPHYLOCOCCUS AUREUS  Final      Susceptibility   Methicillin resistant staphylococcus aureus - MIC*    CIPROFLOXACIN 2 INTERMEDIATE Intermediate     ERYTHROMYCIN >=8 RESISTANT Resistant     GENTAMICIN <=0.5 SENSITIVE Sensitive     OXACILLIN >=4 RESISTANT Resistant     TETRACYCLINE <=1 SENSITIVE Sensitive     VANCOMYCIN <=0.5 SENSITIVE Sensitive     TRIMETH/SULFA <=10 SENSITIVE Sensitive     CLINDAMYCIN <=0.25 SENSITIVE Sensitive     RIFAMPIN <=0.5 SENSITIVE Sensitive     Inducible Clindamycin NEGATIVE Sensitive     * METHICILLIN RESISTANT STAPHYLOCOCCUS AUREUS  Blood Culture ID Panel (Reflexed)     Status: Abnormal   Collection Time: 11/17/20  6:45 PM  Result Value Ref Range Status   Enterococcus faecalis NOT DETECTED NOT DETECTED Final   Enterococcus Faecium NOT DETECTED NOT DETECTED Final   Listeria monocytogenes NOT DETECTED NOT DETECTED Final   Staphylococcus species DETECTED (A) NOT DETECTED Final    Comment: CRITICAL RESULT CALLED TO, READ BACK BY AND VERIFIED WITH: PHARMD CHRISTINE SHADE ON 11/18/20 AT 1508 BY KJ    Staphylococcus aureus (BCID) DETECTED (A) NOT DETECTED Final    Comment: Methicillin (oxacillin)-resistant Staphylococcus aureus (MRSA). MRSA is predictably resistant to beta-lactam antibiotics (except ceftaroline). Preferred therapy is vancomycin unless clinically contraindicated. Patient requires contact precautions if  hospitalized. CRITICAL  RESULT CALLED TO, READ BACK BY AND VERIFIED WITH: PHARMD CHRISTINE SHADE ON 11/18/20 AT 1508 BY KJ    Staphylococcus epidermidis NOT DETECTED NOT DETECTED Final   Staphylococcus lugdunensis NOT DETECTED NOT DETECTED Final   Streptococcus species NOT DETECTED NOT DETECTED Final   Streptococcus agalactiae NOT DETECTED NOT DETECTED Final   Streptococcus pneumoniae NOT DETECTED NOT DETECTED Final   Streptococcus pyogenes NOT DETECTED NOT DETECTED Final   A.calcoaceticus-baumannii NOT DETECTED NOT DETECTED Final   Bacteroides fragilis NOT DETECTED NOT DETECTED Final   Enterobacterales NOT DETECTED NOT DETECTED Final   Enterobacter cloacae complex NOT DETECTED NOT DETECTED Final   Escherichia coli NOT DETECTED NOT DETECTED Final   Klebsiella aerogenes NOT DETECTED NOT DETECTED Final   Klebsiella oxytoca NOT DETECTED NOT DETECTED Final   Klebsiella pneumoniae NOT DETECTED NOT DETECTED Final   Proteus species NOT DETECTED NOT DETECTED Final   Salmonella species NOT DETECTED NOT DETECTED Final   Serratia marcescens NOT DETECTED NOT DETECTED Final   Haemophilus influenzae NOT DETECTED NOT DETECTED Final   Neisseria meningitidis NOT DETECTED NOT DETECTED Final   Pseudomonas aeruginosa NOT DETECTED NOT DETECTED Final   Stenotrophomonas maltophilia NOT DETECTED NOT DETECTED Final   Candida albicans NOT DETECTED NOT DETECTED Final   Candida auris NOT DETECTED NOT DETECTED Final   Candida glabrata NOT DETECTED NOT DETECTED Final   Candida krusei NOT DETECTED NOT DETECTED Final   Candida parapsilosis NOT DETECTED NOT DETECTED Final   Candida tropicalis NOT DETECTED NOT DETECTED Final   Cryptococcus neoformans/gattii NOT DETECTED NOT DETECTED Final   Meth resistant mecA/C and MREJ DETECTED (A) NOT DETECTED Final    Comment: CRITICAL RESULT  CALLED TO, READ BACK BY AND VERIFIED WITH: PHARMD CHRISTINE SHADE ON 11/18/20 AT 1508 BY KJ Performed at [facility], [address], [place], [place]  [address]   Resp Panel by RT-PCR (Flu A&B, Covid) Nasopharyngeal Swab     Status: None   Collection Time: 11/17/20  7:12 PM   Specimen: Nasopharyngeal Swab; Nasopharyngeal(NP) swabs in vial transport medium  Result Value Ref Range Status   SARS Coronavirus 2 by RT PCR NEGATIVE NEGATIVE Final    Comment: (NOTE) SARS-CoV-2 target nucleic acids are NOT DETECTED.  The SARS-CoV-2 RNA is generally detectable in upper respiratory specimens during the acute phase of infection. The lowest concentration of SARS-CoV-2 viral copies this assay can detect is 138 copies/mL. A negative result does not preclude SARS-Cov-2 infection and should not be used as the sole basis for treatment or other patient management decisions. A negative result may occur with  improper specimen collection/handling, submission of specimen other than nasopharyngeal swab, presence of viral mutation(s) within the areas targeted by this assay, and inadequate number of viral copies(<138 copies/mL). A negative result must be combined with clinical observations, patient history, and epidemiological information. The expected result is Negative.  Fact Sheet for Patients:  BloggerCourse.com  Fact Sheet for Healthcare Providers:  SeriousBroker.it  This test is no t yet approved or cleared by the [place] FDA and  has been authorized for detection and/or diagnosis of SARS-CoV-2 by FDA under an Emergency Use Authorization (EUA). This EUA will remain  in effect (meaning this test can be used) for the duration of the COVID-19 declaration under Section 564(b)(1) of the Act, 21 U.S.C.section 360bbb-3(b)(1), unless the authorization is terminated  or revoked sooner.       Influenza A by PCR NEGATIVE NEGATIVE Final   Influenza B by PCR NEGATIVE NEGATIVE Final    Comment: (NOTE) The Xpert Xpress SARS-CoV-2/FLU/RSV plus assay is intended as an aid in the diagnosis of influenza from  Nasopharyngeal swab specimens and should not be used as a sole basis for treatment. Nasal washings and aspirates are unacceptable for Xpert Xpress SARS-CoV-2/FLU/RSV testing.  Fact Sheet for Patients: BloggerCourse.com  Fact Sheet for Healthcare Providers: SeriousBroker.it  This test is not yet approved or cleared by the [place] FDA and has been authorized for detection and/or diagnosis of SARS-CoV-2 by FDA under an Emergency Use Authorization (EUA). This EUA will remain in effect (meaning this test can be used) for the duration of the COVID-19 declaration under Section 564(b)(1) of the Act, 21 U.S.C. section 360bbb-3(b)(1), unless the authorization is terminated or revoked.  Performed at [facility], [address] [place], [place] [address]   CSF culture     Status: None (Preliminary result)   Collection Time: 11/18/20  1:56 PM   Specimen: CSF; Cerebrospinal Fluid  Result Value Ref Range Status   Specimen Description   Final    CSF Performed at [facility], [address] [place], [place] [address]    Special Requests   Final    LP Performed at [facility], [address] [place], [place] [address]    Gram Stain   Final    WBC PRESENT, PREDOMINANTLY MONONUCLEAR NO ORGANISMS SEEN CYTOSPIN SMEAR Gram Stain Report Called to,Read Back By and Verified With: H.JENKINS, RN AT 1614 ON 04.08.22 BY N.THOMPSON Performed at [facility], [address] [place], [place] [address]    Culture   Final    NO  GROWTH 1 DAY Performed at [facility], [address] [place], [place] [address]    Report Status PENDING  Incomplete  Culture, blood (routine x 2)     Status: None (Preliminary result)   Collection Time: 11/18/20  7:57 PM   Specimen: BLOOD  Result Value Ref Range Status   Specimen Description   Final    BLOOD RIGHT  ANTECUBITAL Performed at [facility], [address], [place], [place] [address]    Special Requests   Final    BOTTLES DRAWN AEROBIC ONLY Blood Culture adequate volume Performed at [facility], [address] [place], [place] [address]    Culture   Final    NO GROWTH 1 DAY Performed at [facility], [address] [place], [place] [address]    Report Status PENDING  Incomplete  Culture, blood (routine x 2)     Status: None (Preliminary result)   Collection Time: 11/18/20  7:57 PM   Specimen: BLOOD RIGHT HAND  Result Value Ref Range Status   Specimen Description   Final    BLOOD RIGHT HAND Performed at [facility], [address] [place], [place] [address]    Special Requests   Final    BOTTLES DRAWN AEROBIC ONLY Blood Culture adequate volume Performed at [facility], [address] [place], [place] [address]    Culture   Final    NO GROWTH 1 DAY Performed at [facility], [address], [place], [place] [address]    Report Status PENDING  Incomplete    Studies/Results: MR BRAIN W WO CONTRAST  Result Date: 11/19/2020 CLINICAL DATA:  Mental status change.  Encephalopathy EXAM: MRI HEAD WITHOUT AND WITH CONTRAST TECHNIQUE: Multiplanar, multiecho pulse sequences of the brain and surrounding structures were obtained without and with intravenous contrast. CONTRAST:  7.40mL GADAVIST GADOBUTROL 1 MMOL/ML IV SOLN COMPARISON:  CT head 11/17/2020.  MRI head 12/24/2019 FINDINGS: Brain: Patchy areas of hyperintensity in the left occipital pole extending into the left posterior parietal lobe best seen on FLAIR. This involves primarily the white matter but some areas of cortex. This area does not show restricted diffusion or susceptibility. This area was normal on the prior MRI 2021. No abnormal enhancement. Moderate atrophy. Mild white matter changes consistent with chronic microvascular ischemia.  Diffusion-weighted imaging negative for acute infarct. No hemorrhage or mass lesion. Vascular: Normal arterial flow voids Skull and upper cervical spine: Negative Sinuses/Orbits: Mild mucosal edema paranasal sinuses. Negative orbit Other: None IMPRESSION: Ill-defined patchy hyperintensity in the left occipital parietal lobe without restricted diffusion or abnormal enhancement. Differential diagnosis includes late subacute infarct, encephalitis. Tumor not considered likely. Correlate with symptoms. Electronically Signed   By: Marlan Palau M.D.   On: 11/19/2020 15:24   ECHOCARDIOGRAM COMPLETE  Result Date: 11/19/2020    ECHOCARDIOGRAM REPORT   Patient Name:   Becky Hendricks Date of Exam: 11/19/2020 Medical Rec #:  [id number]      Height:       66.0 in Accession #:    [id number]     Weight:       195.2 lb Date of Birth:  02-21-1946       BSA:          1.979 m Patient Age:    74 years       BP:           145/76 mmHg Patient Gender: F  HR:           75 bpm. Exam Location:  Inpatient Procedure: 2D Echo Indications:    bacteremia  History:        Patient has prior history of Echocardiogram examinations, most                 recent 12/23/2019. CHF, Arrythmias:Atrial Fibrillation,                 Signs/Symptoms:Bacteremia; Risk Factors:Diabetes.  Sonographer:    Delcie Roch Referring Phys: [id number] OJJKK LATIF [facility]  Sonographer Comments: Image acquisition challenging due to uncooperative patient and Image acquisition challenging due to patient body habitus. IMPRESSIONS  1. Limited study due to poor acoustic windows. Left ventricular ejection fraction, by estimation, is 60 to 65%. The left ventricle has normal function. The left ventricle has no regional wall motion abnormalities. There is mild concentric left ventricular hypertrophy. Left ventricular diastolic parameters are consistent with Grade I diastolic dysfunction (impaired relaxation).  2. Right ventricular systolic function is mildly reduced. The  right ventricular size is mildly-to-moderately enlarged.  3. The mitral valve is normal in structure. Trivial mitral valve regurgitation. No evidence of mitral stenosis.  4. The aortic valve is tricuspid. There is mild calcification of the aortic valve. There is mild thickening of the aortic valve. Aortic valve regurgitation is not visualized. Mild aortic valve sclerosis is present, with no evidence of aortic valve stenosis.  5. No valvular vegetations visualized on surface echo. Pending clinical suspicion for infective endocarditis, can consider TEE for further evaluation. Comparison(s): Compared to prior TTE on 12/23/19, the RV systolic function appears mildly improved. Otherwise there is no significant change. Conclusion(s)/Recommendation(s): No evidence of valvular vegetations on this transthoracic echocardiogram. Would recommend a transesophageal echocardiogram to exclude infective endocarditis if clinically indicated. FINDINGS  Left Ventricle: Limited study due to poor acoustic windows. Left ventricular ejection fraction, by estimation, is 60 to 65%. The left ventricle has normal function. The left ventricle has no regional wall motion abnormalities. Definity contrast agent was given IV to delineate the left ventricular endocardial borders. The left ventricular internal cavity size was normal in size. There is mild concentric left ventricular hypertrophy. Left ventricular diastolic parameters are consistent with Grade I diastolic dysfunction (impaired relaxation). Right Ventricle: The right ventricular size is mildly-to-moderately enlarged. Right vetricular wall thickness was not well visualized. Right ventricular systolic function is mildly reduced. Left Atrium: Left atrial size was normal in size. Right Atrium: Right atrial size was normal in size. Pericardium: There is no evidence of pericardial effusion. Mitral Valve: The mitral valve is normal in structure. There is mild thickening of the mitral valve  leaflet(s). There is mild calcification of the mitral valve leaflet(s). Trivial mitral valve regurgitation. No evidence of mitral valve stenosis. Tricuspid Valve: The tricuspid valve is normal in structure. Tricuspid valve regurgitation is trivial. Aortic Valve: The aortic valve is tricuspid. There is mild calcification of the aortic valve. There is mild thickening of the aortic valve. Aortic valve regurgitation is not visualized. Mild aortic valve sclerosis is present, with no evidence of aortic valve stenosis. Pulmonic Valve: The pulmonic valve was not well visualized. Pulmonic valve regurgitation is not visualized. Aorta: The aortic root and ascending aorta are structurally normal, with no evidence of dilitation. IAS/Shunts: No atrial level shunt detected by color flow Doppler.  LEFT VENTRICLE PLAX 2D LVIDd:         4.26 cm LVIDs:         2.71 cm LV  PW:         1.17 cm LV IVS:        1.06 cm LVOT diam:     1.80 cm LVOT Area:     2.54 cm  RIGHT VENTRICLE RV S prime:     9.90 cm/s LEFT ATRIUM         Index LA diam:    3.80 cm 1.92 cm/m   AORTA Ao Root diam: 2.90 cm Ao Asc diam:  3.40 cm  SHUNTS Systemic Diam: 1.80 cm Laurance FlattenHeather Pemberton MD Electronically signed by Laurance FlattenHeather Pemberton MD Signature Date/Time: 11/19/2020/5:00:20 PM    Final       Assessment/Plan:  INTERVAL HISTORY:   MRI of the brain shows patchy hyperdensity of the left occipital parietal lobe Was concerns about possible "red man" syndrome Principal Problem:   Bipolar affective disorder, currently manic, mild (HCC) Active Problems:   Acute metabolic encephalopathy   Severe sepsis (HCC)   AF (paroxysmal atrial fibrillation) (HCC)   Sacral wound   Chronic diastolic CHF (congestive heart failure) (HCC)   Bipolar disorder (HCC)   SIRS (systemic inflammatory response syndrome) (HCC)   Nausea and vomiting   Mixed diabetic hyperlipidemia associated with type 2 diabetes mellitus (HCC)   Type 2 diabetes mellitus with hyperglycemia, with  long-term current use of insulin (HCC)   Constipation   Encephalopathy acute   MRSA bacteremia    Theda SersJanina Stejskal is a 75 y.o. 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  2 can be considered as a "complicated "  Repeat blood cultures have been taken.  2D echocardiogram does not show vegetations  Given concerns about "red man" syndrome change to daptomycin   MRI showed a possible subacute infarct  I would not get a TEE but simply treat her for 4 to 6 weeks with systemic antibiotics  Her schizophrenia and confabulation make it difficult to elicit them her if she has other problems besides her decubitus ulcer and her bacteremia.  We will follow up again tomorrow   LOS: 2 days   Acey LavCornelius Van Dam 11/20/2020, 4:31 PM

## 2020-11-20 NOTE — Progress Notes (Addendum)
Patient is alert and oriented to self, extremely agitated and combative this morning, refusing assessments and all medications except Seroquel 12.5 mg PO (see MAR). Skin rash on upper body bilaterally and buttucks noted on exam, pt states it itches but refuses medication, barrier cream used to relieve discomfort, MD updated.

## 2020-11-20 NOTE — Evaluation (Signed)
Clinical/Bedside Swallow Evaluation Patient Details  Name: Sherri Mcarthy MRN: [id number] Date of Birth: 1946-01-31  Today's Date: 11/20/2020 Time: SLP Start Time (ACUTE ONLY): 1638 SLP Stop Time (ACUTE ONLY): 0940 SLP Time Calculation (min) (ACUTE ONLY): 15 min  Past Medical History:  Past Medical History:  Diagnosis Date  . Atrial fibrillation (HCC)   . Bipolar disorder (HCC)   . Bipolar disorder (HCC) 04/27/2020  . Clostridium difficile diarrhea 08/02/2019  . Dehydration   . Essential hypertension   . Fall 02/11/2020  . Morbid obesity (HCC)   . Osteomyelitis (HCC) 12/03/2019  . Pneumonia due to COVID-19 virus 09/15/2019  . Type 2 diabetes mellitus (HCC)   . Weakness    Past Surgical History:  Past Surgical History:  Procedure Laterality Date  . INCISION AND DRAINAGE PERIRECTAL ABSCESS Left 12/04/2019   Procedure: IRRIGATION AND DEBRIDEMENT LEFT BUTTOCK ABSCESS;  Surgeon: Violeta Gelinas, MD;  Location: [facility] OR;  Service: General;  Laterality: Left;  . INCISION AND DRAINAGE PERIRECTAL ABSCESS Left 12/10/2019   Procedure: REPEAT IRRIGATION AND DEBRIDEMENT BUTTOCK  ABSCESS;  Surgeon: Abigail Miyamoto, MD;  Location: [facility] OR;  Service: General;  Laterality: Left;   HPI:  Patient is a 75 y.o. 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 Patient reportedly has been exhibiting worsening bouts of aggressive behavior as well.  Upon admission to ED, she was found to have a leukocytosis of 12 with several bouts of fever in the emergency department with T-max of 101.5 F.  Patient also exhibited multiple bouts of vomiting and several loose stools. MRI brain revealed patchy hyperintensity in left occipital parietal lobe with differential diagnosis late  subacute infarct vs encephalitis.   Assessment / Plan / Recommendation Clinical Impression  Patient presents with an oropharyngeal swallow that appears WFL-WNL as per limited amount of PO's consumed. Patient is currently confused, easily agitated (h/o bipolar) and suspicious (starting to pour water out of cup and asking SLP if it was just water) and so SLP did not perform full oral motor exam but observed patient as she consumed straw sips of thin liquids (water) and spoon bites of ice cream. No overt s/s aspiration or penetration were observed, patient able to feed self without difficulty and was fully alert. SLP recommending initiate regular solids, thin liquids and not recommending f/u at this time. SLP Visit Diagnosis: Dysphagia, unspecified (R13.10)   Please reorder ST services if patient exhibiting any swallowing difficulties/concerns.     Aspiration Risk  No limitations;Mild aspiration risk    Diet Recommendation Regular;Thin liquid   Liquid Administration via: Cup;Straw Medication Administration: Whole meds with liquid Supervision: Patient able to self feed Compensations: Slow rate;Small sips/bites Postural Changes: Seated upright at 90 degrees    Other  Recommendations Oral Care Recommendations: Oral care BID   Follow up Recommendations None      Frequency and Duration     N/A       Prognosis   N/A    Swallow Study   General Date of Onset: 11/17/20 HPI: Patient is a 75 y.o. 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 Patient reportedly has been exhibiting worsening bouts of aggressive behavior as well.  Upon admission to ED, she was found to have a leukocytosis of 12 with several bouts of fever in the emergency  department with T-max of 101.5 F.  Patient also exhibited multiple bouts of vomiting and several loose stools. MRI brain revealed patchy hyperintensity in left occipital parietal lobe with differential diagnosis late subacute infarct vs encephalitis. Type of Study: Bedside Swallow Evaluation Previous Swallow Assessment: None found Diet Prior to this Study: NPO Temperature Spikes Noted: No (was spiking fever on 4/7) Respiratory Status: Room air History of Recent Intubation: No Behavior/Cognition: Alert;Confused;Agitated;Requires cueing Oral Cavity Assessment: Other (comment) (SLP did not attempt to perform oral mech exam due to patient's poor participation overall. Visualized teeth appearing to be all/mostly present and in good condition. No observed facial assymetry or weakness) Oral Care Completed by SLP: No Oral Cavity - Dentition: Adequate natural dentition Vision: Functional for self-feeding Self-Feeding Abilities: Able to feed self Patient Positioning: Upright in bed Baseline Vocal Quality: Normal Volitional Cough: Other (Comment) (did not perform)    Oral/Motor/Sensory Function Overall Oral Motor/Sensory Function: Within functional limits   Ice Chips     Thin Liquid Thin Liquid: Within functional limits Presentation: Straw;Self Fed    Nectar Thick     Honey Thick     Puree Puree: Within functional limits Presentation: Self Fed   Solid     Solid: Not tested      Angela Nevin, MA, CCC-SLP Speech Therapy [facility]

## 2020-11-20 NOTE — Plan of Care (Signed)
  Problem: Coping: Goal: Level of anxiety will decrease 11/20/2020 2008 by Merrilee Seashore, RN Outcome: Not Progressing 11/20/2020 2007 by Merrilee Seashore, RN Outcome: Not Progressing   Problem: Safety: Goal: Ability to remain free from injury will improve 11/20/2020 2008 by Merrilee Seashore, RN Outcome: Progressing 11/20/2020 2007 by Merrilee Seashore, RN Outcome: Progressing   Problem: Health Behavior/Discharge Planning: Goal: Ability to manage health-related needs will improve Outcome: Not Progressing   Problem: Clinical Measurements: Goal: Diagnostic test results will improve Outcome: Not Progressing   Problem: Activity: Goal: Risk for activity intolerance will decrease Outcome: Progressing   Problem: Nutrition: Goal: Adequate nutrition will be maintained Outcome: Progressing

## 2020-11-20 NOTE — Progress Notes (Addendum)
PROGRESS NOTE                                                                                                                                                                                                             Patient Demographics:    Becky Hendricks, is a 75 y.o. female, DOB - [birth year], YME:[id number]  Outpatient Primary MD for the patient is [name], MD    LOS - 2  Admit date - 11/17/2020    Chief Complaint  Patient presents with  . ivc  . 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  So far LP, CT scan chest abdomen pelvis nonacute, MRI with questionable occipital lobe changes.   Subjective:    Becky Hendricks today has, No headache, No chest pain, No abdominal pain - No Nausea, No new weakness tingling or numbness, no SOB, overall mildly confused.   Assessment  & Plan :   1.  Acute toxic encephalopathy gradually progressive at SNF - MRI with ?  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  2. Sepsis in the setting of MRSA bacteremia - ID consulted, on Vancomycin for now, TTE stable, requested TEE, repeat cultures stable thus far.  3. N&V with stool ball - supportive Rx, bowel  regimen. Check KUB.  4. H/O Sacral Osteomyelitis with healed Scaral Decub Ulcer - continue nursing wound care, will check ESR CRP and trend procalcitonin as well.   5.  Bipolar disorder.  Currently on combination of Depakote and Seroquel.  Psych has seen the patient, request continued follow-up.  6.  Paroxysmal A. fib.  Mali vas 2 score of greater than 3.  Currently on Eliquis, not on any rate controlling agents.  7.  Dyslipidemia.  On statin.  8.  Chronic diastolic CHF EF 17%.  Currently compensated.  9.  Obesity.  BMI of 31.  Supportive care follow-up with PCP for weight loss.  10.  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  Currently on vancomycin, will discuss with ID and likely switch.  Dose of IV Benadryl on 11/20/2020.   11. DM type II.  For now sliding scale.  Lab Results  Component Value Date   HGBA1C 6.1 (H) 11/18/2020   CBG (last 3)  Recent Labs    11/19/20 1854 11/20/20 0027 11/20/20 0606  GLUCAP 75 86 92        Condition - Fair  Family Communication  : Brother [name] [phone] over the phone on 11/20/2020 at 9:36 AM  Code Status :  Full  Consults  :  Neuro  PUD Prophylaxis : PPI   Procedures  :     MRI - Ill-defined patchy hyperintensity in the left occipital parietal lobe without restricted diffusion or abnormal enhancement. Differential diagnosis includes late subacute infarct, encephalitis.   LP - non acute  CT - 1. Main pulmonary artery measures at the upper limits of normal. Correlate with pulmonary hypertension. 2. Otherwise no acute intrathoracic abnormality. 3.  Cholelithiasis with no CT findings of acute cholecystitis. 4. An 8 cm stool ball within the rectum with no associated findings suggest of stercoral colitis. 5. Grossly stable in size uterine fibroids  TTE - 1. Limited study due to poor acoustic windows. Left ventricular ejection fraction, by estimation, is 60 to 65%. The left ventricle has normal function. The left ventricle has no regional wall motion abnormalities. There is mild concentric left ventricular hypertrophy. Left ventricular diastolic parameters are consistent with Grade I diastolic dysfunction (impaired relaxation).  2. Right ventricular systolic function is mildly reduced. The right ventricular size is mildly-to-moderately enlarged.  3. The mitral valve is normal in structure. Trivial mitral valve regurgitation. No evidence of mitral stenosis.  4. The aortic valve is tricuspid. There is mild calcification of the aortic valve. There is mild thickening of the aortic valve. Aortic valve regurgitation is not visualized. Mild aortic valve sclerosis is present, with no evidence of aortic valve stenosis.  5. No valvular vegetations visualized on surface echo      Disposition Plan  :    Status is: Inpatient  Remains inpatient appropriate because:IV treatments appropriate due to intensity of illness or inability to take PO   Dispo: The patient is from: SNF              Anticipated d/c is to: SNF              Patient currently is not medically stable to d/c.   Difficult to place patient No   DVT Prophylaxis  :  Eliquis  Lab Results  Component Value Date   PLT 242 11/19/2020    Diet :  Diet Order            Diet NPO time specified Except for: [employer], Sips  with Meds  Diet effective now                  Inpatient Medications  Scheduled Meds: . apixaban  5 mg Oral BID  . divalproex  500 mg Oral BID  . insulin aspart  0-15 Units Subcutaneous Q6H  . QUEtiapine  200 mg Oral QHS  . QUEtiapine  50 mg Oral QHS   Continuous  Infusions: . vancomycin 1,250 mg (11/19/20 2315)   PRN Meds:.acetaminophen **OR** acetaminophen, ondansetron **OR** ondansetron (ZOFRAN) IV, QUEtiapine  Antibiotics  :    Anti-infectives (From admission, onward)   Start     Dose/Rate Route Frequency Ordered Stop   11/18/20 2200  vancomycin (VANCOREADY) IVPB 1250 mg/250 mL        1,250 mg 166.7 mL/hr over 90 Minutes Intravenous Every 24 hours 11/17/20 2345     11/18/20 0000  ampicillin (OMNIPEN) 2 g in sodium chloride 0.9 % 100 mL IVPB  Status:  Discontinued        2 g 300 mL/hr over 20 Minutes Intravenous Every 4 hours 11/17/20 2319 11/18/20 1520   11/17/20 2345  vancomycin (VANCOCIN) IVPB 1000 mg/200 mL premix        1,000 mg 200 mL/hr over 60 Minutes Intravenous  Once 11/17/20 2331 11/18/20 0155   11/17/20 2330  cefTRIAXone (ROCEPHIN) 2 g in sodium chloride 0.9 % 100 mL IVPB  Status:  Discontinued        2 g 200 mL/hr over 30 Minutes Intravenous Every 12 hours 11/17/20 2319 11/18/20 1520       Time Spent in minutes  30   Lala Lund M.D on 11/20/2020 at 9:17 AM  To page go to [email or web address]   [facility] -  Office  [phone]    See all Orders from today for further details    Objective:   Vitals:   11/19/20 2047 11/19/20 2310 11/20/20 0326 11/20/20 0727  BP: (!) 175/84 (!) 171/97 (!) 160/77 (!) 186/86  Pulse: (!) 47 83 (!) 55 78  Resp: '19 18 20 18  ' Temp: 97.8 F (36.6 C) 97.6 F (36.4 C) 97.8 F (36.6 C) 98.3 F (36.8 C)  TempSrc: Oral Oral Axillary Oral  SpO2: 98% 98% 98% 99%    Wt Readings from Last 3 Encounters:  10/12/20 88.5 kg  05/06/20 92.5 kg  04/27/20 92.5 kg     Intake/Output Summary (Last 24 hours) at 11/20/2020 0917 Last data filed at 11/20/2020 0842 Gross per 24 hour  Intake 250.08 ml  Output 500 ml  Net -249.92 ml     Physical Exam  Awake but confused, No new F.N deficits,   [place].AT,PERRAL Supple Neck,No JVD, No cervical lymphadenopathy appriciated.  Symmetrical Chest  wall movement, Good air movement bilaterally, CTAB RRR,No Gallops,Rubs or new Murmurs, No Parasternal Heave +ve B.Sounds, Abd Soft, No tenderness, No organomegaly appriciated, No rebound - guarding or rigidity. No Cyanosis, Clubbing or edema, diffuse macular rash in arms and trunk    Data Review:    CBC Recent Labs  Lab 11/17/20 1600 11/18/20 0154 11/19/20 1933  WBC 12.0* 11.8* 8.1  HGB 13.4 12.7 13.3  HCT 42.0 39.7 41.5  PLT 248 227 242  MCV 89.6 89.8 90.4  MCH 28.6 28.7 29.0  MCHC 31.9 32.0 32.0  RDW 14.2 14.2 14.3  LYMPHSABS  --  3.1 2.3  MONOABS  --  1.2* 0.9  EOSABS  --  0.0 0.4  BASOSABS  --  0.0 0.0  Recent Labs  Lab 11/17/20 1600 11/17/20 1842 11/18/20 0154 11/18/20 1109 11/19/20 1933  NA 135  --  133*  --  139  K 3.9  --  4.1  --  4.2  CL 97*  --  96*  --  104  CO2 26  --  27  --  25  GLUCOSE 152*  --  155*  --  83  BUN 17  --  18  --  19  CREATININE 0.51  --  0.69  --  0.55  CALCIUM 9.4  --  8.9  --  8.8*  AST 13*  --  13*  --  18  ALT 15  --  13  --  12  ALKPHOS 54  --  43  --  42  BILITOT 0.9  --  0.6  --  1.2  ALBUMIN 3.7  --  3.3*  --  3.2*  MG  --   --  1.7  --  1.9  CRP  --   --  1.1*  --   --   PROCALCITON <0.10  --  <0.10  --   --   LATICACIDVEN  --  1.4 1.5 1.5  --   INR  --   --   --  1.1  --   TSH  --   --  0.771  --   --   HGBA1C  --   --  6.1*  --   --   AMMONIA  --   --  22  --   --     ------------------------------------------------------------------------------------------------------------------ No results for input(s): CHOL, HDL, LDLCALC, TRIG, CHOLHDL, LDLDIRECT in the last 72 hours.  Lab Results  Component Value Date   HGBA1C 6.1 (H) 11/18/2020   ------------------------------------------------------------------------------------------------------------------ Recent Labs    11/18/20 0154  TSH 0.771    Cardiac Enzymes No results for input(s): CKMB, TROPONINI, MYOGLOBIN in the last 168 hours.  Invalid input(s):  CK ------------------------------------------------------------------------------------------------------------------    Component Value Date/Time   BNP 728.1 (H) 01/19/2020 1251     Radiology Reports CT HEAD WO CONTRAST  Result Date: 11/17/2020 CLINICAL DATA:  Altered mental status. EXAM: CT HEAD WITHOUT CONTRAST TECHNIQUE: Contiguous axial images were obtained from the base of the skull through the vertex without intravenous contrast. COMPARISON:  May 05, 2020 FINDINGS: Brain: There is mild cerebral atrophy with widening of the extra-axial spaces and ventricular dilatation. There are areas of decreased attenuation within the white matter tracts of the supratentorial brain, consistent with microvascular disease changes. Vascular: No hyperdense vessel or unexpected calcification. Skull: Normal. Negative for fracture or focal lesion. Sinuses/Orbits: Mild bilateral ethmoid sinus mucosal thickening is seen. Other: The study is limited secondary to patient motion. IMPRESSION: 1. Generalized cerebral atrophy. 2. No acute intracranial abnormality. Electronically Signed   By: Virgina Norfolk M.D.   On: 11/17/2020 17:17   CT Chest W Contrast  Result Date: 11/17/2020 CLINICAL DATA:  Fever, altered mental status.  Nausea and vomiting. EXAM: CT CHEST, ABDOMEN, AND PELVIS WITH CONTRAST TECHNIQUE: Multidetector CT imaging of the chest, abdomen and pelvis was performed following the standard protocol during bolus administration of intravenous contrast. CONTRAST:  149m OMNIPAQUE IOHEXOL 300 MG/ML  SOLN COMPARISON:  CT abdomen pelvis 12/20/2019, CT angiography chest 12/24/2019 FINDINGS: CT CHEST FINDINGS Cardiovascular: Normal heart size. No significant pericardial effusion. The thoracic aorta is normal in caliber. Mild atherosclerotic plaque of the thoracic aorta. The main pulmonary artery measures at the upper limits of normal: 3.2 cm.  Mediastinum/Nodes: No enlarged mediastinal, hilar, or axillary lymph  nodes. Thyroid gland, trachea, and esophagus demonstrate no significant findings. Lungs/Pleura: Right lower lobe atelectasis with persistent elevated right hemidiaphragm. Limited evaluation for pulmonary nodule due to respiratory motion artifact. No pulmonary mass. No focal consolidation. No pleural effusion. No pneumothorax. Musculoskeletal: No chest wall abnormality. No suspicious lytic or blastic osseous lesions. No acute displaced fracture. Multilevel degenerative changes of the spine. CT ABDOMEN PELVIS FINDINGS Hepatobiliary: No focal liver abnormality. Gallstone noted within the gallbladder lumen. No gallbladder wall thickening or pericholecystic fluid. No biliary dilatation. Pancreas: No focal lesion. Normal pancreatic contour. No surrounding inflammatory changes. No main pancreatic ductal dilatation. Spleen: Normal in size without focal abnormality. Adrenals/Urinary Tract: No adrenal nodule bilaterally. Bilateral kidneys enhance symmetrically. Exophytic hypodense 2.2 cm lesion within the right kidney likely represent simple renal cysts. Slightly increased density likely due to streak artifact at this level. No hydronephrosis. No hydroureter. The urinary bladder is unremarkable. On delayed imaging, there is no urothelial wall thickening and there are no filling defects in the opacified portions of the bilateral collecting systems or ureters. Stomach/Bowel: Stomach is within normal limits. No evidence of bowel wall thickening or dilatation. 8 cm stool ball within the rectum. The appendix not definitely identified. Vascular/Lymphatic: No abdominal aorta or iliac aneurysm. Mild atherosclerotic plaque of the aorta and its branches. No abdominal, pelvic, or inguinal lymphadenopathy. Reproductive: Difficult to measure grossly stable uterine fibroids. No adnexal lesion. Other: No intraperitoneal free fluid. No intraperitoneal free gas. No organized fluid collection. Musculoskeletal: Tiny fat containing left inguinal  hernia. No suspicious lytic or blastic osseous lesions. No acute displaced fracture. Multilevel degenerative changes of the spine. IMPRESSION: 1. Main pulmonary artery measures at the upper limits of normal. Correlate with pulmonary hypertension. 2. Otherwise no acute intrathoracic abnormality. 3. Cholelithiasis with no CT findings of acute cholecystitis. 4. An 8 cm stool ball within the rectum with no associated findings suggest of stercoral colitis. 5. 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 Electronically Signed   By: Iven Finn M.D.   On: 11/17/2020 20:07   MR BRAIN W WO CONTRAST  Result Date: 11/19/2020 CLINICAL DATA:  Mental status change.  Encephalopathy EXAM: MRI HEAD WITHOUT AND WITH CONTRAST TECHNIQUE: Multiplanar, multiecho pulse sequences of the brain and surrounding structures were obtained without and with intravenous contrast. CONTRAST:  7.35m GADAVIST GADOBUTROL 1 MMOL/ML IV SOLN COMPARISON:  CT head 11/17/2020.  MRI head 12/24/2019 FINDINGS: Brain: Patchy areas of hyperintensity in the left occipital pole extending into the left posterior parietal lobe best seen on FLAIR. This involves primarily the white matter but some areas of cortex. This area does not show restricted diffusion or susceptibility. This area was normal on the prior MRI 2021. No abnormal enhancement. Moderate atrophy. Mild white matter changes consistent with chronic microvascular ischemia. Diffusion-weighted imaging negative for acute infarct. No hemorrhage or mass lesion. Vascular: Normal arterial flow voids Skull and upper cervical spine: Negative Sinuses/Orbits: Mild mucosal edema paranasal sinuses. Negative orbit Other: None IMPRESSION: Ill-defined patchy hyperintensity in the left occipital parietal lobe without restricted diffusion or abnormal enhancement. Differential diagnosis includes late subacute infarct, encephalitis. Tumor not considered likely. Correlate with symptoms. Electronically Signed   By: CFranchot GalloM.D.   On: 11/19/2020 15:24   CT Abdomen Pelvis W Contrast  Result Date: 11/17/2020 CLINICAL DATA:  Fever, altered mental status.  Nausea and vomiting. EXAM: CT CHEST, ABDOMEN, AND PELVIS WITH CONTRAST TECHNIQUE: Multidetector CT imaging of the chest, abdomen and pelvis  was performed following the standard protocol during bolus administration of intravenous contrast. CONTRAST:  167m OMNIPAQUE IOHEXOL 300 MG/ML  SOLN COMPARISON:  CT abdomen pelvis 12/20/2019, CT angiography chest 12/24/2019 FINDINGS: CT CHEST FINDINGS Cardiovascular: Normal heart size. No significant pericardial effusion. The thoracic aorta is normal in caliber. Mild atherosclerotic plaque of the thoracic aorta. The main pulmonary artery measures at the upper limits of normal: 3.2 cm. Mediastinum/Nodes: No enlarged mediastinal, hilar, or axillary lymph nodes. Thyroid gland, trachea, and esophagus demonstrate no significant findings. Lungs/Pleura: Right lower lobe atelectasis with persistent elevated right hemidiaphragm. Limited evaluation for pulmonary nodule due to respiratory motion artifact. No pulmonary mass. No focal consolidation. No pleural effusion. No pneumothorax. Musculoskeletal: No chest wall abnormality. No suspicious lytic or blastic osseous lesions. No acute displaced fracture. Multilevel degenerative changes of the spine. CT ABDOMEN PELVIS FINDINGS Hepatobiliary: No focal liver abnormality. Gallstone noted within the gallbladder lumen. No gallbladder wall thickening or pericholecystic fluid. No biliary dilatation. Pancreas: No focal lesion. Normal pancreatic contour. No surrounding inflammatory changes. No main pancreatic ductal dilatation. Spleen: Normal in size without focal abnormality. Adrenals/Urinary Tract: No adrenal nodule bilaterally. Bilateral kidneys enhance symmetrically. Exophytic hypodense 2.2 cm lesion within the right kidney likely represent simple renal cysts. Slightly increased density likely due to streak  artifact at this level. No hydronephrosis. No hydroureter. The urinary bladder is unremarkable. On delayed imaging, there is no urothelial wall thickening and there are no filling defects in the opacified portions of the bilateral collecting systems or ureters. Stomach/Bowel: Stomach is within normal limits. No evidence of bowel wall thickening or dilatation. 8 cm stool ball within the rectum. The appendix not definitely identified. Vascular/Lymphatic: No abdominal aorta or iliac aneurysm. Mild atherosclerotic plaque of the aorta and its branches. No abdominal, pelvic, or inguinal lymphadenopathy. Reproductive: Difficult to measure grossly stable uterine fibroids. No adnexal lesion. Other: No intraperitoneal free fluid. No intraperitoneal free gas. No organized fluid collection. Musculoskeletal: Tiny fat containing left inguinal hernia. No suspicious lytic or blastic osseous lesions. No acute displaced fracture. Multilevel degenerative changes of the spine. IMPRESSION: 1. Main pulmonary artery measures at the upper limits of normal. Correlate with pulmonary hypertension. 2. Otherwise no acute intrathoracic abnormality. 3. Cholelithiasis with no CT findings of acute cholecystitis. 4. An 8 cm stool ball within the rectum with no associated findings suggest of stercoral colitis. 5. 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 Electronically Signed   By: MIven FinnM.D.   On: 11/17/2020 20:07   ECHOCARDIOGRAM COMPLETE  Result Date: 11/19/2020    ECHOCARDIOGRAM REPORT   Patient Name:   JJERALDIN FESLERDate of Exam: 11/19/2020 Medical Rec #:  [id number]     Height:       66.0 in Accession #:    [id number]    Weight:       195.2 lb Date of Birth:  5March 08, 1947      BSA:          1.979 m Patient Age:    89years       BP:           145/76 mmHg Patient Gender: F              HR:           75 bpm. Exam Location:  Inpatient Procedure: 2D Echo Indications:    bacteremia  History:        Patient has prior history of  Echocardiogram examinations, most  recent 12/23/2019. CHF, Arrythmias:Atrial Fibrillation,                 Signs/Symptoms:Bacteremia; Risk Factors:Diabetes.  Sonographer:    Johny Chess Referring Phys: [id number] QASUO LATIF [facility]  Sonographer Comments: Image acquisition challenging due to uncooperative patient and Image acquisition challenging due to patient body habitus. IMPRESSIONS  1. Limited study due to poor acoustic windows. Left ventricular ejection fraction, by estimation, is 60 to 65%. The left ventricle has normal function. The left ventricle has no regional wall motion abnormalities. There is mild concentric left ventricular hypertrophy. Left ventricular diastolic parameters are consistent with Grade I diastolic dysfunction (impaired relaxation).  2. Right ventricular systolic function is mildly reduced. The right ventricular size is mildly-to-moderately enlarged.  3. The mitral valve is normal in structure. Trivial mitral valve regurgitation. No evidence of mitral stenosis.  4. The aortic valve is tricuspid. There is mild calcification of the aortic valve. There is mild thickening of the aortic valve. Aortic valve regurgitation is not visualized. Mild aortic valve sclerosis is present, with no evidence of aortic valve stenosis.  5. No valvular vegetations visualized on surface echo. Pending clinical suspicion for infective endocarditis, can consider TEE for further evaluation. Comparison(s): Compared to prior TTE on 15/61/53, the RV systolic function appears mildly improved. Otherwise there is no significant change. Conclusion(s)/Recommendation(s): No evidence of valvular vegetations on this transthoracic echocardiogram. Would recommend a transesophageal echocardiogram to exclude infective endocarditis if clinically indicated. FINDINGS  Left Ventricle: Limited study due to poor acoustic windows. Left ventricular ejection fraction, by estimation, is 60 to 65%. The left ventricle has  normal function. The left ventricle has no regional wall motion abnormalities. Definity contrast agent was given IV to delineate the left ventricular endocardial borders. The left ventricular internal cavity size was normal in size. There is mild concentric left ventricular hypertrophy. Left ventricular diastolic parameters are consistent with Grade I diastolic dysfunction (impaired relaxation). Right Ventricle: The right ventricular size is mildly-to-moderately enlarged. Right vetricular wall thickness was not well visualized. Right ventricular systolic function is mildly reduced. Left Atrium: Left atrial size was normal in size. Right Atrium: Right atrial size was normal in size. Pericardium: There is no evidence of pericardial effusion. Mitral Valve: The mitral valve is normal in structure. There is mild thickening of the mitral valve leaflet(s). There is mild calcification of the mitral valve leaflet(s). Trivial mitral valve regurgitation. No evidence of mitral valve stenosis. Tricuspid Valve: The tricuspid valve is normal in structure. Tricuspid valve regurgitation is trivial. Aortic Valve: The aortic valve is tricuspid. There is mild calcification of the aortic valve. There is mild thickening of the aortic valve. Aortic valve regurgitation is not visualized. Mild aortic valve sclerosis is present, with no evidence of aortic valve stenosis. Pulmonic Valve: The pulmonic valve was not well visualized. Pulmonic valve regurgitation is not visualized. Aorta: The aortic root and ascending aorta are structurally normal, with no evidence of dilitation. IAS/Shunts: No atrial level shunt detected by color flow Doppler.  LEFT VENTRICLE PLAX 2D LVIDd:         4.26 cm LVIDs:         2.71 cm LV PW:         1.17 cm LV IVS:        1.06 cm LVOT diam:     1.80 cm LVOT Area:     2.54 cm  RIGHT VENTRICLE RV S prime:     9.90 cm/s LEFT ATRIUM  Index LA diam:    3.80 cm 1.92 cm/m   AORTA Ao Root diam: 2.90 cm Ao Asc diam:   3.40 cm  SHUNTS Systemic Diam: 1.80 cm Gwyndolyn Kaufman MD Electronically signed by Gwyndolyn Kaufman MD Signature Date/Time: 11/19/2020/5:00:20 PM    Final    DG FL GUIDED LUMBAR PUNCTURE  Result Date: 11/18/2020 CLINICAL DATA:  Encephalopathy.  Fever.  Rule out meningitis EXAM: DIAGNOSTIC LUMBAR PUNCTURE UNDER FLUOROSCOPIC GUIDANCE COMPARISON:  CT abdomen pelvis 11/17/2020 FLUOROSCOPY TIME:  Fluoroscopy Time:  0 minutes 48 seconds Radiation Exposure Index (if provided by the fluoroscopic device): Number of Acquired Spot Images: 1 PROCEDURE: Patient was sedated prior to the study. The patient was uncooperative. Informed consent was obtained from the patient's brother by telephone prior to the procedure, including potential complications of headache, allergy, and pain. With the patient prone, the lower back was prepped with Betadine. 1% Lidocaine was used for local anesthesia. Lumbar puncture was performed at the L2-3 level using a 20 gauge needle with return of clear CSF with an opening pressure of 13 cm water. Six ml of CSF were obtained for laboratory studies. The patient tolerated the procedure well and there were no apparent complications. IMPRESSION: Successful lumbar puncture.  Clear CSF. Electronically Signed   By: Franchot Gallo M.D.   On: 11/18/2020 14:20

## 2020-11-20 NOTE — Evaluation (Signed)
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  Acute toxic encephalopathy.  Clinical Impression  Pt admitted with above complications. Noticeable contracture developed Rt hand would benefit from OT visit. Pt participatory with PT to a degree today, requires frequent redirection due to confused state. Mod assist with bed mobility. Did not tolerate sit<>stand attempts due to weakness. Sat EOB showing emerging postural control but initially required significant assist. Pt currently with functional limitations due to the deficits listed below (see PT Problem List). Pt will benefit from skilled PT to increase their independence and safety with mobility to allow discharge to the venue listed below.       Follow Up Recommendations SNF    Equipment Recommendations  Hospital bed    Recommendations for Other Services OT consult (Rt hand splint for contracture)     Precautions / Restrictions Precautions Precautions: Fall Restrictions Weight Bearing Restrictions: No      Mobility  Bed Mobility Overal bed mobility: Needs Assistance Bed Mobility: Rolling;Supine to Sit;Sit to Supine Rolling: Mod assist   Supine to sit: Mod assist;HOB elevated Sit to supine: Min assist   General bed mobility comments: Mod assist rolling and sitting up EOB with cues for technique, and physical assist for trunk support and stability upon reaching EOB, leaning towards Rt with poor awareness. Min assist  to return to bed with support for LEs. Practiced scooting up in bed with use of LT hand, pushing through LEs.    Transfers Overall transfer level: Needs assistance Equipment used: 1 person hand held assist Transfers: Sit to/from Stand Sit to Stand: Total assist         General transfer comment: Pt unable to perform this date. Attempted x2. with knee block. Cues for technique. Pt does show effort but did not clear buttock from bed.  Ambulation/Gait                Stairs            Wheelchair Mobility    Modified Rankin (Stroke Patients Only)       Balance Overall balance assessment: Needs assistance Sitting-balance support: Bilateral upper extremity supported;Feet supported Sitting balance-Leahy Scale: Poor Sitting balance - Comments: Progressed from max assist to min with seated balance. leaning Rt intially but showed improvement towards end of session. Postural control: Right lateral lean                                   Pertinent Vitals/Pain Pain Assessment: Faces Faces Pain Scale: No hurt    Home Living Family/patient expects to be discharged to:: Assisted living                 Additional Comments: Pt is a poor historian. No family immediately available.    Prior Function Level of Independence: Needs assistance   Gait / Transfers Assistance Needed: assistance of staff for ADL's and transfers  ADL's / Homemaking Assistance Needed: Pt receiveing assistance for ADL at SNF; Need clarification  Comments: Inofrmation obtained from previous  admission. pt unable to recall most previous independence level.     Hand Dominance   Dominant Hand: Right    Extremity/Trunk Assessment   Upper Extremity Assessment Upper Extremity Assessment: Defer to OT evaluation    Lower Extremity Assessment Lower Extremity Assessment: Difficult to assess due to impaired cognition (showing increased gastroc tightness perhaps contracted BIL)        Communication   Communication: No difficulties  Cognition Arousal/Alertness: Awake/alert Behavior During Therapy: Agitated;Impulsive (Not aggressive with therapist.) Overall Cognitive Status: Impaired/Different from baseline Area of Impairment: Orientation;Following commands;Safety/judgement;Problem solving                 Orientation Level: Disoriented to;Place;Time;Situation     Following Commands: Follows one step commands inconsistently Safety/Judgement: Decreased awareness of deficits;Decreased awareness of safety   Problem Solving: Slow processing;Decreased initiation;Difficulty sequencing;Requires verbal cues;Requires tactile cues General Comments: Perseverates on horse hair, and the war.      General Comments General comments (skin integrity, edema, etc.): Easily distracted, confused. Requires lots of redirection to participate.    Exercises     Assessment/Plan    PT Assessment Patient needs continued PT services  PT Problem List Decreased strength;Decreased range of motion;Decreased activity tolerance;Decreased balance;Decreased mobility;Decreased coordination;Decreased cognition;Decreased knowledge of use of DME;Decreased safety awareness;Decreased knowledge of precautions;Obesity;Decreased skin integrity       PT Treatment Interventions DME instruction;Gait training;Functional mobility training;Therapeutic activities;Therapeutic exercise;Balance training;Neuromuscular re-education;Cognitive remediation;Patient/family education;Wheelchair mobility training    PT Goals (Current goals can be found in the Care Plan section)  Acute Rehab PT Goals Patient Stated Goal: none stated PT Goal Formulation: Patient unable to participate in goal setting Time For Goal Achievement: 12/04/20 Potential to Achieve Goals: Fair    Frequency Min 2X/week   Barriers to discharge        Co-evaluation               AM-PAC PT "6 Clicks" Mobility  Outcome Measure Help  needed turning from your back to your side while in a flat bed without using bedrails?: A Lot Help needed moving from lying on your back to sitting on the side of a flat bed without using bedrails?: A Lot Help needed moving to and from a bed to a chair (including a wheelchair)?: Total Help needed standing up from a chair using your arms (e.g., wheelchair or bedside chair)?: Total Help needed to walk in hospital room?: Total Help needed climbing 3-5 steps with a railing? : Total 6 Click Score: 8    End of Session Equipment Utilized During Treatment: Gait belt Activity Tolerance: Treatment limited secondary to agitation Patient left: in bed;with call bell/phone within reach;with bed alarm set;with nursing/sitter in room Nurse Communication: Mobility status PT Visit Diagnosis: Muscle weakness (generalized) (M62.81);Difficulty in walking, not elsewhere classified (R26.2);Other symptoms and signs involving the nervous system (R29.898);Hemiplegia and hemiparesis Hemiplegia - dominant/non-dominant:  (Unknown)    Time: 5597-4163 PT Time Calculation (min) (ACUTE ONLY): 25 min   Charges:   PT Evaluation $PT Eval Moderate Complexity: 1 Mod PT Treatments $Therapeutic Activity: 8-22 mins        [employer], PT   Berton Mount 11/20/2020, 3:07 PM

## 2020-11-20 NOTE — Progress Notes (Signed)
Per NT tech/sitter, Patient refused to have her blood glucose level checked. MD made aware.

## 2020-11-20 NOTE — Consult Note (Addendum)
Neurology Consultation  Reason for Consult: Acute encephalopathy / aggression Referring Physician: Dr. Thedore Mins  CC: Acute encephalopathy  History is obtained from: Chart review  HPI: Becky Hendricks is a 75 y.o. female with a medical history significant for paroxysmal atrial fibrillation, bipolar disorder, clostridium difficile diarrhea, hypertension, hyperlipidemia, chronic osteomyelitis of the coccyx due to stage IV sacral wound, type 2 diabetes mellitus, and diastolic congestive heart failure with an EF of 65-70%, cardiac arrest in May of 2021  who initially presented to [facility] on 4/7 from her skilled nursing facility for evaluation of poor oral intake and altered mental status / aggression. According to chart review, Becky Hendricks has had aggressive behavior that has been progressively worsening at her nursing facility for 2 - 3 days before hospital arrival with associated poor oral intake.   In the ED, Becky Hendricks was noted to be febrile with a temperature max of 101.5 degrees and a mild leukocytosis with a WBC of 12K without concomitant lactic acid elevation and some vomiting and diarrhea. She was also noted to be lethargic while in the ED. Initial imaging was performed without a clear identifiable source of infection. Due to progressive encephalopathy, fevers, MRI with possible evidence of encephalitis versus subacute infarct, and concern for possible meningitis, empiric antibiotics were initiated and neurology was consulted for further evaluation. Since transfer to [facility] for admission, Becky Hendricks had a blood culture result positive for MRSA bacteremia and I&D was consulted.   ROS: Unable to obtain due to altered mental status.   Past Medical History:  Diagnosis Date  . Atrial fibrillation (HCC)   . Bipolar disorder (HCC)   . Bipolar disorder (HCC) 04/27/2020  . Clostridium difficile diarrhea 08/02/2019  . Dehydration   . Essential hypertension   . Fall 02/11/2020  . Morbid obesity (HCC)   .  Osteomyelitis (HCC) 12/03/2019  . Pneumonia due to COVID-19 virus 09/15/2019  . Type 2 diabetes mellitus (HCC)   . Weakness    Past Surgical History:  Procedure Laterality Date  . INCISION AND DRAINAGE PERIRECTAL ABSCESS Left 12/04/2019   Procedure: IRRIGATION AND DEBRIDEMENT LEFT BUTTOCK ABSCESS;  Surgeon: Violeta Gelinas, MD;  Location: [facility] OR;  Service: General;  Laterality: Left;  . INCISION AND DRAINAGE PERIRECTAL ABSCESS Left 12/10/2019   Procedure: REPEAT IRRIGATION AND DEBRIDEMENT BUTTOCK  ABSCESS;  Surgeon: Abigail Miyamoto, MD;  Location: [facility] OR;  Service: General;  Laterality: Left;   Family History  Family history unknown: Yes   Social History:   reports that she has never smoked. She has never used smokeless tobacco. She reports previous alcohol use. She reports previous drug use.  Medications  Current Facility-Administered Medications:  .  acetaminophen (TYLENOL) tablet 650 mg, 650 mg, Oral, Q6H PRN **OR** acetaminophen (TYLENOL) suppository 650 mg, 650 mg, Rectal, Q6H PRN, Shalhoub, Deno Lunger, MD .  apixaban (ELIQUIS) tablet 5 mg, 5 mg, Oral, BID, Sheikh, Omair Latif, DO .  atorvastatin (LIPITOR) tablet 10 mg, 10 mg, Oral, Daily, Thedore Mins, Stanford Scotland, MD .  bisacodyl (DULCOLAX) suppository 10 mg, 10 mg, Rectal, Daily, Leroy Sea, MD .  diphenhydrAMINE (BENADRYL) 25 mg in sodium chloride 0.9 % 50 mL IVPB, 25 mg, Intravenous, Once, Susa Raring K, MD .  divalproex (DEPAKOTE) DR tablet 500 mg, 500 mg, Oral, BID, Blount, Xenia T, NP .  docusate sodium (COLACE) capsule 100 mg, 100 mg, Oral, BID, Singh, Prashant K, MD .  haloperidol lactate (HALDOL) injection 2 mg, 2 mg, Intravenous, Q6H PRN, Leroy Sea, MD .  insulin aspart (novoLOG) injection 0-15 Units, 0-15 Units, Subcutaneous, Q6H, Shalhoub, Deno Lunger, MD, 3 Units at 11/18/20 1301 .  multivitamin with minerals tablet 1 tablet, 1 tablet, Oral, Daily, Thedore Mins, Prashant K, MD .  [DISCONTINUED] ondansetron (ZOFRAN) tablet  4 mg, 4 mg, Oral, Q6H PRN **OR** ondansetron (ZOFRAN) injection 4 mg, 4 mg, Intravenous, Q6H PRN, Shalhoub, Deno Lunger, MD .  pantoprazole (PROTONIX) EC tablet 40 mg, 40 mg, Oral, Daily, Singh, Prashant K, MD .  polyethylene glycol (MIRALAX / GLYCOLAX) packet 17 g, 17 g, Oral, BID, Leroy Sea, MD, 17 g at 11/20/20 0949 .  QUEtiapine (SEROQUEL) tablet 12.5 mg, 12.5 mg, Oral, BID PRN, Charm Rings, NP, 12.5 mg at 11/20/20 0950 .  QUEtiapine (SEROQUEL) tablet 200 mg, 200 mg, Oral, QHS, Starkes-Perry, Juel Burrow, FNP .  QUEtiapine (SEROQUEL) tablet 50 mg, 50 mg, Oral, QHS, Starkes-Perry, Juel Burrow, FNP .  vancomycin (VANCOREADY) IVPB 1250 mg/250 mL, 1,250 mg, Intravenous, Q24H, Shalhoub, Deno Lunger, MD, Last Rate: 166.7 mL/hr at 11/19/20 2315, 1,250 mg at 11/19/20 2315  Current Outpatient Medications  Medication Instructions  . Amino Acids-Protein Hydrolys (FEEDING SUPPLEMENT, PRO-STAT SUGAR FREE 64,) LIQD 30 mLs, Oral, 2 times daily  . apixaban (ELIQUIS) 5 mg, Oral, 2 times daily  . atorvastatin (LIPITOR) 10 mg, Oral, Daily  . calcium carbonate (TUMS - DOSED IN MG ELEMENTAL CALCIUM) 500 mg, Oral, Daily  . carvedilol (COREG) 3.125 mg, Oral, 2 times daily  . cloNIDine HCl (KAPVAY) 0.1 mg, Oral, 2 times daily  . diphenhydrAMINE (BENADRYL) 2 % cream 1 application, Topical, 3 times daily PRN  . divalproex (DEPAKOTE) 500 mg, Oral, 2 times daily  . docusate sodium (COLACE) 100 mg, Oral, 2 times daily PRN  . Ensure Max Protein (ENSURE MAX PROTEIN) LIQD 11 oz, Oral, 2 times daily  . furosemide (LASIX) 20 mg, Oral, Every other day  . gabapentin (NEURONTIN) 100 mg, Oral, 2 times daily  . insulin glargine (LANTUS) 4 Units, Subcutaneous, Daily at bedtime, Notify PEC for CBG less than 100 or greater than 250.  Marland Kitchen Ipratropium-Albuterol (COMBIVENT RESPIMAT) 20-100 MCG/ACT AERS respimat 1 puff, Inhalation, Every 12 hours  . losartan (COZAAR) 25 mg, Oral, Daily  . Multiple Vitamin (MULTIVITAMIN WITH MINERALS)  TABS tablet 1 tablet, Oral, Daily  . pantoprazole (PROTONIX) 40 mg, Oral, 2 times daily  . QUEtiapine (SEROQUEL) 50 mg, Oral, Daily at bedtime, Take with 200mg   . sennosides-docusate sodium (SENOKOT-S) 8.6-50 MG tablet 1 tablet, Oral, Daily  . SEROquel 200 mg, Oral, Daily at bedtime, Take with 50mg   . sitaGLIPtin-metformin (JANUMET) 50-1000 MG tablet 1 tablet, Oral, 2 times daily with meals  . Vitamin D 10,000 Units, Oral, Weekly, Wednesday   Exam: Current vital signs: BP (!) 186/86 (BP Location: Right Arm) Comment: RN Notified  Pulse 78   Temp 98.3 F (36.8 C) (Oral)   Resp 18   LMP  (LMP Unknown)   SpO2 99%  Vital signs in last 24 hours: Temp:  [97.6 F (36.4 C)-98.3 F (36.8 C)] 98.3 F (36.8 C) (04/10 0727) Pulse Rate:  [47-83] 78 (04/10 0727) Resp:  [18-20] 18 (04/10 0727) BP: (145-186)/(76-97) 186/86 (04/10 0727) SpO2:  [94 %-99 %] 99 % (04/10 0727)  Physical assessment limited due to patient aggression and lack of cooperation. Patient refused most of examiner assessment yelling obscenities, insulting staff, kicking towards examiner, and throwing objects. GENERAL: Awake, yelling obscenities at staff, throwing objects at staff and on the floor. Screaming at examiner to get out  of the room. Does not appear in discomfort.  Psych: Affect is angry, patient is agitated and impulsive Head: Normocephalic and atraumatic EENT: No OP obstruction, normal conjunctivae LUNGS: Normal respiratory effort, non-labored breathing CV: Regular rate on cardiac monitor ABDOMEN: Non-distended Ext: no obvious abnormality   NEURO:  Mental Status: Alert, aggressive, yelling obscenities in a [place] accent. When asked if she is [place] she states "no". She does not participate in orientation questions and yells that the examiner has stolen enough information from her. Speech is intact without dysarthria or aphasia.  Language/communication: patient with disorganized and paranoid thoughts / delusions.  She does not appear to be hallucinating. She has poor attention.  Does not appear to be neglecting.  Cranial Nerves:  II: Control and instrumentation engineer and other staff in bilateral visual fields. Does not participate with further examination of visual fields.  III, IV, VI: EOMI. Will fixate and track various stimuli in all visual fields.   V: Unable to assess due to patient aggression and lack of cooperation (refusing examination) VII: Face appears symmetric resting and scowling.  VIII: Hearing is intact to voice. IX, X: Phonation normal.  XI: Head appears midline, turns head side-to-side without restriction of ROM or evidence of pain with head and neck rotation.  XII: Unable to assess due to patient aggression and lack of cooperation (refusing examination) Motor: Moves bilateral upper extremities spontaneously with antigravity movement. Attempts to kick examiner multiple times with right lower extremity and examiner visualized left lower extremity with spontaneous movement but did not visualize attempt at antigravity movement of the LLE.  Sensation: Unable to assess due to patient aggression and lack of cooperation (refusing examination) Coordination: Unable to assess due to patient aggression and lack of cooperation (refusing examination)  DTRs: Unable to assess due to patient aggression and lack of cooperation (refusing examination) Gait- deferred  Labs I have reviewed labs in epic and the results pertinent to this consultation are: CBC    Component Value Date/Time   WBC 8.1 11/19/2020 1933   RBC 4.59 11/19/2020 1933   HGB 13.3 11/19/2020 1933   HCT 41.5 11/19/2020 1933   PLT 242 11/19/2020 1933   MCV 90.4 11/19/2020 1933   MCH 29.0 11/19/2020 1933   MCHC 32.0 11/19/2020 1933   RDW 14.3 11/19/2020 1933   LYMPHSABS 2.3 11/19/2020 1933   MONOABS 0.9 11/19/2020 1933   EOSABS 0.4 11/19/2020 1933   BASOSABS 0.0 11/19/2020 1933  CMP     Component Value Date/Time   NA 139 11/19/2020 1933   K 4.2  11/19/2020 1933   CL 104 11/19/2020 1933   CO2 25 11/19/2020 1933   GLUCOSE 83 11/19/2020 1933   BUN 19 11/19/2020 1933   CREATININE 0.55 11/19/2020 1933   CREATININE 0.63 06/27/2020 0924   CALCIUM 8.8 (L) 11/19/2020 1933   PROT 6.5 11/19/2020 1933   ALBUMIN 3.2 (L) 11/19/2020 1933   AST 18 11/19/2020 1933   ALT 12 11/19/2020 1933   ALKPHOS 42 11/19/2020 1933   BILITOT 1.2 11/19/2020 1933   GFRNONAA >60 11/19/2020 1933   GFRNONAA 90 06/27/2020 0924   GFRAA 104 06/27/2020 0924   Lab Results  Component Value Date   TSH 0.771 11/18/2020   Lab Results  Component Value Date   VITAMINB12 211 11/18/2020   Lab Results  Component Value Date   FOLATE 35.1 11/18/2020   Lab Results  Component Value Date   CRP 1.1 (H) 11/18/2020   Urinalysis    Component Value Date/Time  COLORURINE STRAW (A) 11/17/2020 1714   APPEARANCEUR CLEAR 11/17/2020 1714   LABSPEC 1.013 11/17/2020 1714   PHURINE 8.0 11/17/2020 1714   GLUCOSEU 50 (A) 11/17/2020 1714   HGBUR NEGATIVE 11/17/2020 1714   BILIRUBINUR NEGATIVE 11/17/2020 1714   KETONESUR 20 (A) 11/17/2020 1714   PROTEINUR NEGATIVE 11/17/2020 1714   NITRITE NEGATIVE 11/17/2020 1714   LEUKOCYTESUR NEGATIVE 11/17/2020 1714   Drugs of Abuse  No results found for: LABOPIA, COCAINSCRNUR, LABBENZ, AMPHETMU, THCU, LABBARB   RPR: Non-reactive Ammonia: 22 Blood culture 4/7: MRSA- gram positive cocci in clusters Urine culture: No growth Valproic Acid Level: <10  (documented seizure history but without evidence of seizures- suspect only mood stabilization dose)  CSF Gram stain: No organisms seen; culture- no growth at one day Fungal Culture pending  Results for ALYCEN, MACK (MRN [id number]) as of 11/20/2020 12:15  Ref. Range 11/18/2020 13:56 11/18/2020 13:56  Appearance, CSF Latest Ref Range: CLEAR  CLEAR CLEAR  Glucose, CSF Latest Ref Range: 40 - 70 mg/dL 87 (H)   RBC Count, CSF Latest Ref Range: 0 /cu mm 3 (H) 1 (H)  WBC, CSF Latest Ref Range:  0 - 5 /cu mm 3 6 (H)  Segmented Neutrophils-CSF Latest Ref Range: 0 - 6 % 16 (H) 27 (H)  Lymphs, CSF Latest Ref Range: 40 - 80 % 60 60  Monocyte-Macrophage-Spinal Fluid Latest Ref Range: 15 - 45 % 24 13 (L)  Eosinophils, CSF Latest Ref Range: 0 - 1 % 0 0  Color, CSF Latest Ref Range: COLORLESS  COLORLESS COLORLESS  Supernatant Unknown NOT INDICATED NOT INDICATED  Total  Protein, CSF Latest Ref Range: 15 - 45 mg/dL 31   Tube # Unknown 1 4    Imaging I have reviewed the images obtained: CT-scan of the brain 4/7: 1. Generalized cerebral atrophy. 2. No acute intracranial abnormality.  MRI examination of the brain Ill-defined patchy hyperintensity in the left occipital parietal lobe without restricted diffusion or abnormal enhancement. Differential diagnosis includes late subacute infarct, encephalitis.Tumor not considered likely. Correlate with symptoms.  Assessment: 75 year old female with history as above who presents from SNF with 2-3 days of progressively worsening aggressive behavior, poor oral intake and is found with a mild leukocytosis and febrile in the ED with blood culture results positive for MRSA bacteremia.  - Examination reveals patient with aggressive behavior yelling obscenities at staff, throwing objects with flight of ideas and paranoid thoughts who is uncooperative with physical examination. Appears to be moving all extremities spontaneously without obvious weakness. Denies pain.  - MRI with concern for patchy hyperintensity in the left occipital parietal lobe without restricted diffusion or abnormal enhancement concerning for possible late subacute infarct or encephalitis.  This is likely related to posterior reversible encephalopathy syndrome which can be caused because of uncontrolled blood pressures.  I do not suspect any infectious process given that her CSF was unremarkable. - Presentation consistent with psychiatric illness / acute encephalopathy.  Chart review reveals  extensive psychiatric history  Impression: Acute encephalopathy with underlying psychiatric illness with aggressive behavior in the setting of acute medical illness.  Recommendations: - Continue Depakote 500 mg BID - suspect mood stabilization dose, per chart review; multiple EEGs have been completed due to AMS with evidence of varying degrees of encephalopathy but without identified seizures or epileptiform discharges - Continue treatment of MRSA bacteremia per ID - Minimize use of sedating medications as much as possible - Can use low dose Seroquel BID for agitation - CSF studies pending HSV  PCR, IgG, fungal culture, autoimmune encephalitis panel, HSV pending-primary team to follow-up on the studies.  Please recall neurology if any abnormality shows up. - BP management per primary team -Further management per primary team and psychiatry.   Pt seen by NP/Neuro and later by MD. Note/plan to be edited by MD as needed.  Lanae BoastStevi Toberman, AGAC-NP [facility] Pager: [phone]   Attending Neurohospitalist Addendum Patient seen and examined with APP/Resident. Agree with the history and physical as documented above. Agree with the plan as documented, which I helped formulate. I have independently reviewed the chart, obtained history, review of systems and examined the patient.I have personally reviewed pertinent head/neck/spine imaging (CT/MRI). Imaging reviewed with another neurology colleague as well.  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 Primarily psychiatric illness with acute worsening.  Further management per primary team and psychiatry Please feel free to call with any questions. --- Milon DikesAshish Laurin Morgenstern, MD [facility] Pager: [phone]

## 2020-11-20 NOTE — Plan of Care (Signed)
  Problem: Coping: Goal: Level of anxiety will decrease Outcome: Not Progressing   Problem: Safety: Goal: Ability to remain free from injury will improve Outcome: Progressing   Problem: Health Behavior/Discharge Planning: Goal: Ability to manage health-related needs will improve Outcome: Not Progressing   Problem: Clinical Measurements: Goal: Diagnostic test results will improve Outcome: Not Progressing   Problem: Activity: Goal: Risk for activity intolerance will decrease Outcome: Not Progressing   Problem: Nutrition: Goal: Adequate nutrition will be maintained Outcome: Not Progressing

## 2020-11-20 NOTE — Progress Notes (Signed)
Pharmacy Antibiotic Note  Becky Hendricks is a 75 y.o. female admitted on 11/17/2020 with MRSA bacteremia in 1/4 blood cultures. Repeat blood cultures from 4/8 are no growth to date. TTE yesterday was negative. Noted MRI yesterday showed hyperintensity int he left occipital parietal lobe -possible infarct. WBC WNL and patient now afebrile. SCr is stable at <1.   Plan: Continue Vancomycin 1250 mg every 24 hours for now Will get Vancomycin levels at steady state- 4/11 dose   Follow renal function, cultures and clinical course    Temp (24hrs), Avg:97.9 F (36.6 C), Min:97.6 F (36.4 C), Max:98.3 F (36.8 C)  Recent Labs  Lab 11/17/20 1600 11/17/20 1842 11/18/20 0154 11/18/20 1109 11/19/20 1933  WBC 12.0*  --  11.8*  --  8.1  CREATININE 0.51  --  0.69  --  0.55  LATICACIDVEN  --  1.4 1.5 1.5  --     CrCl cannot be calculated (Unknown ideal weight.).    Allergies  Allergen Reactions  . Chlorhexidine Gluconate Itching  . Acetazolamide Er Rash    Antimicrobials this admission: 4/7 CTX >>4/8 4/8 vanc >> 4/8 amp >>4/8 Dose adjustments this admission:   Microbiology results: 4/7 BCx: 1/4 MRSA 4/8 repeat BCx: NGTD  4/7 UCx: no growth final   Thank you for allowing pharmacy to be a part of this patient's care.  Sharin Mons, PharmD, BCPS, BCIDP Infectious Diseases Clinical Pharmacist Phone: [phone] 11/20/2020, 8:13 AM

## 2020-11-20 NOTE — Progress Notes (Signed)
Pt continues to refused assessments and medications.

## 2020-11-20 NOTE — Progress Notes (Signed)
Pharmacy Antibiotic Note  Becky Hendricks is a 75 y.o. female admitted on 11/17/2020 with MRSA bacteremia in 1/4 blood cultures. Repeat blood cultures from 4/8 are no growth to date. TTE yesterday was negative. Noted MRI yesterday showed hyperintensity int he left occipital parietal lobe -possible infarct. WBC WNL and patient now afebrile. SCr is stable at <1.   ID concerned about [place] sx, pharmacy asked to transition to daptomycin.  Plan: Daptomycin 8mg /kg q24h   Weight: 88 kg (194 lb 0.1 oz)  Temp (24hrs), Avg:97.9 F (36.6 C), Min:97.6 F (36.4 C), Max:98.3 F (36.8 C)  Recent Labs  Lab 11/17/20 1600 11/17/20 1842 11/18/20 0154 11/18/20 1109 11/19/20 1933  WBC 12.0*  --  11.8*  --  8.1  CREATININE 0.51  --  0.69  --  0.55  LATICACIDVEN  --  1.4 1.5 1.5  --     CrCl cannot be calculated (Unknown ideal weight.).    Allergies  Allergen Reactions  . Chlorhexidine Gluconate Itching  . Acetazolamide Er Rash    Antimicrobials this admission: 4/7 CTX >>4/8 4/8 vanc >>4/9 4/8 amp >>4/8 4/10 Daptomycin >>   Microbiology results: 4/7 BCx: 1/4 MRSA 4/8 repeat BCx: NGTD  4/7 UCx: no growth final   Thank you for allowing pharmacy to be a part of this patient's care.  6/7, PharmD, BCPS, [facility] Clinical Pharmacist [phone] Please check AMION for all [name] Pharmacy numbers 11/20/2020

## 2020-11-21 ENCOUNTER — Inpatient Hospital Stay (HOSPITAL_COMMUNITY): Payer: Medicare Other

## 2020-11-21 ENCOUNTER — Encounter (HOSPITAL_COMMUNITY): Payer: Self-pay | Admitting: Certified Registered Nurse Anesthetist

## 2020-11-21 ENCOUNTER — Encounter (HOSPITAL_COMMUNITY)
Admission: EM | Disposition: A | Discharge status: Skilled Nursing Facility | Payer: Self-pay | Source: Skilled Nursing Facility | Attending: Internal Medicine

## 2020-11-21 DIAGNOSIS — R7881 Bacteremia: Secondary | ICD-10-CM | POA: Diagnosis not present

## 2020-11-21 DIAGNOSIS — F3111 Bipolar disorder, current episode manic without psychotic features, mild: Secondary | ICD-10-CM | POA: Diagnosis not present

## 2020-11-21 DIAGNOSIS — B9562 Methicillin resistant Staphylococcus aureus infection as the cause of diseases classified elsewhere: Secondary | ICD-10-CM | POA: Diagnosis not present

## 2020-11-21 LAB — MAGNESIUM: Magnesium: 1.7 mg/dL (ref 1.7–2.4)

## 2020-11-21 LAB — MISC LABCORP TEST (SEND OUT): Labcorp test code: 9985

## 2020-11-21 LAB — CBC WITH DIFFERENTIAL/PLATELET
Abs Immature Granulocytes: 0.03 10*3/uL (ref 0.00–0.07)
Basophils Absolute: 0 10*3/uL (ref 0.0–0.1)
Basophils Relative: 0 %
Eosinophils Absolute: 0.5 10*3/uL (ref 0.0–0.5)
Eosinophils Relative: 5 %
HCT: 36.3 % (ref 36.0–46.0)
Hemoglobin: 11.8 g/dL — ABNORMAL LOW (ref 12.0–15.0)
Immature Granulocytes: 0 %
Lymphocytes Relative: 24 %
Lymphs Abs: 2.2 10*3/uL (ref 0.7–4.0)
MCH: 28.6 pg (ref 26.0–34.0)
MCHC: 32.5 g/dL (ref 30.0–36.0)
MCV: 88.1 fL (ref 80.0–100.0)
Monocytes Absolute: 1 10*3/uL (ref 0.1–1.0)
Monocytes Relative: 11 %
Neutro Abs: 5.7 10*3/uL (ref 1.7–7.7)
Neutrophils Relative %: 60 %
Platelets: 233 10*3/uL (ref 150–400)
RBC: 4.12 MIL/uL (ref 3.87–5.11)
RDW: 14.2 % (ref 11.5–15.5)
WBC: 9.5 10*3/uL (ref 4.0–10.5)
nRBC: 0 % (ref 0.0–0.2)

## 2020-11-21 LAB — GLUCOSE, CAPILLARY
Glucose-Capillary: 121 mg/dL — ABNORMAL HIGH (ref 70–99)
Glucose-Capillary: 109 mg/dL — ABNORMAL HIGH (ref 70–99)
Glucose-Capillary: 130 mg/dL — ABNORMAL HIGH (ref 70–99)

## 2020-11-21 LAB — COMPREHENSIVE METABOLIC PANEL
ALT: 13 U/L (ref 0–44)
AST: 12 U/L — ABNORMAL LOW (ref 15–41)
Albumin: 2.7 g/dL — ABNORMAL LOW (ref 3.5–5.0)
Alkaline Phosphatase: 38 U/L (ref 38–126)
Anion gap: 6 (ref 5–15)
BUN: 20 mg/dL (ref 8–23)
CO2: 29 mmol/L (ref 22–32)
Calcium: 9 mg/dL (ref 8.9–10.3)
Chloride: 106 mmol/L (ref 98–111)
Creatinine, Ser: 0.76 mg/dL (ref 0.44–1.00)
GFR, Estimated: >60 mL/min (ref >60)
Glucose, Bld: 139 mg/dL — ABNORMAL HIGH (ref 70–99)
Potassium: 3.6 mmol/L (ref 3.5–5.1)
Sodium: 141 mmol/L (ref 135–145)
Total Bilirubin: 0.7 mg/dL (ref 0.3–1.2)
Total Protein: 5.4 g/dL — ABNORMAL LOW (ref 6.5–8.1)

## 2020-11-21 LAB — CK: Total CK: 20 U/L — ABNORMAL LOW (ref 38–234)

## 2020-11-21 LAB — C-REACTIVE PROTEIN: CRP: 1.2 mg/dL — ABNORMAL HIGH (ref <1.0)

## 2020-11-21 LAB — IGG CSF INDEX
Albumin CSF-mCnc: 22 mg/dL (ref 10–46)
Albumin: 3.7 g/dL (ref 3.7–4.7)
CSF IgG Index: 0.5 (ref 0.0–0.7)
IgG (Immunoglobin G), Serum: 1045 mg/dL (ref 586–1602)
IgG, CSF: 2.8 mg/dL (ref 0.0–6.7)
IgG/Alb Ratio, CSF: 0.13 (ref 0.00–0.25)

## 2020-11-21 LAB — PROCALCITONIN: Procalcitonin: <0.1 ng/mL

## 2020-11-21 LAB — BRAIN NATRIURETIC PEPTIDE: B Natriuretic Peptide: 47.7 pg/mL (ref 0.0–100.0)

## 2020-11-21 IMAGING — CT CT ANGIO HEAD
1 of 11 series · 5 of 33 positions shown · IV contrast (OMNI 350)
Comparison: Prior MRI from [DATE].

CLINICAL DATA: Initial evaluation for neuro deficit, stroke
suspected.

EXAM:
CT ANGIOGRAPHY HEAD AND NECK
TECHNIQUE: Multidetector CT imaging of the head and neck was performed using
the standard protocol during bolus administration of intravenous
contrast. Multiplanar CT image reconstructions and MIPs were
obtained to evaluate the vascular anatomy. Carotid stenosis
measurements (when applicable) are obtained utilizing NASCET
criteria, using the distal internal carotid diameter as the
denominator.
CONTRAST:  75mL OMNIPAQUE IOHEXOL 350 MG/ML SOLN

[Series 11: cta neck axial · axial · 0.39mm/px · z∈[-304,-80]mm · 5 of 339 slices shown]
[im 57/339  soft-tissue]
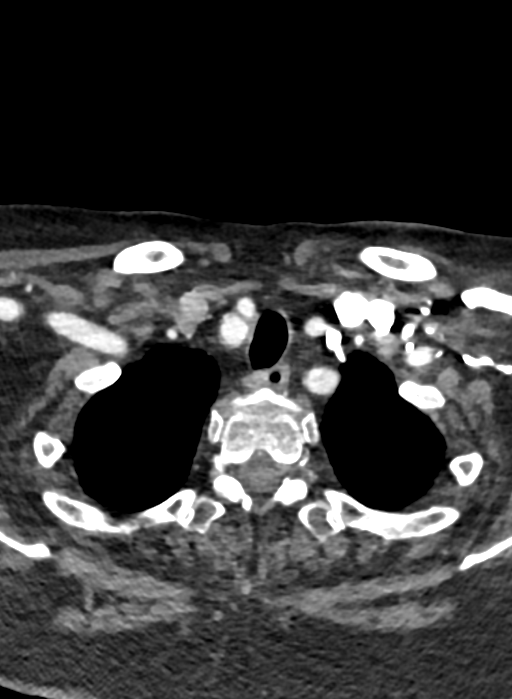
[im 113/339  bone]
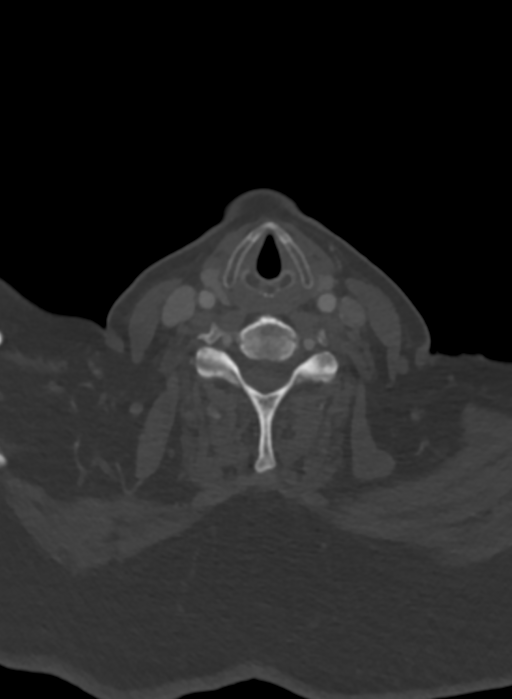
[im 170/339  soft-tissue]
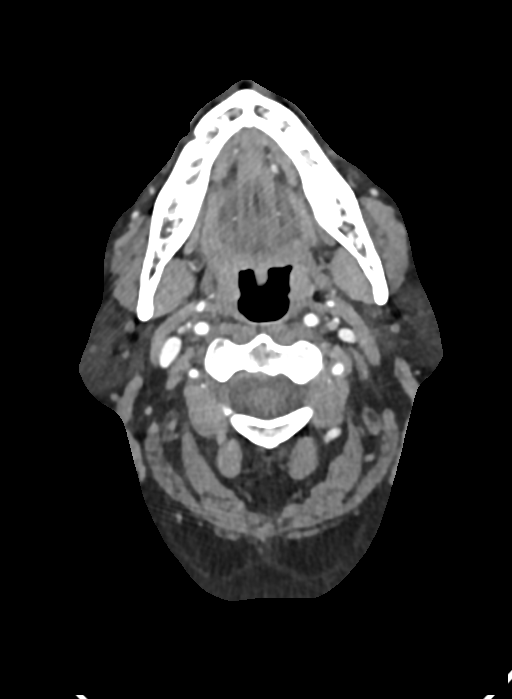
[im 226/339  bone]
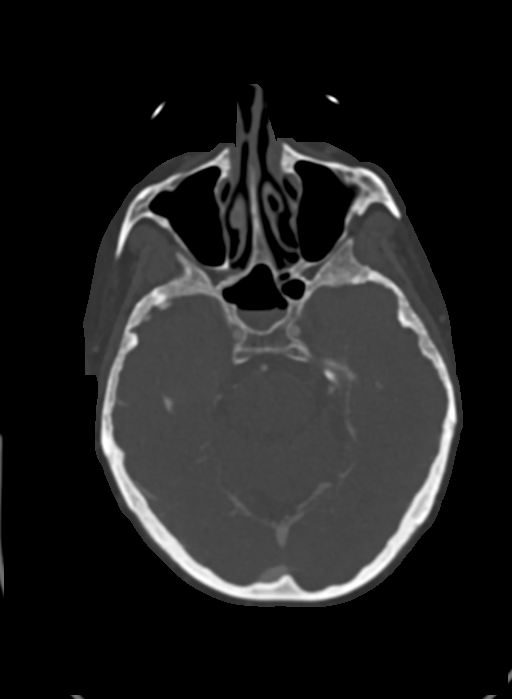
[im 282/339  soft-tissue]
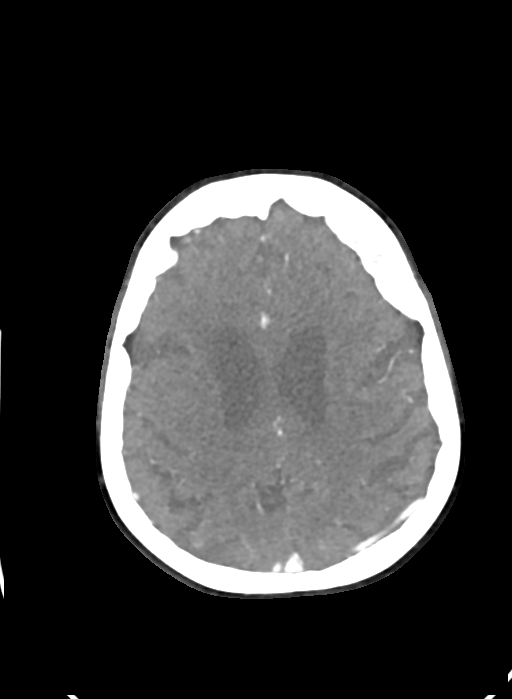

[5 of 33 positions shown; findings below may reference images not displayed]

FINDINGS: CT HEAD FINDINGS

Brain: Age-related cerebral atrophy with mild chronic small vessel
ischemic disease. Recently identified signal changes involving the
left parieto-occipital region not visible by CT.

No acute intracranial hemorrhage. No acute large vessel territory
infarct. No mass lesion or midline shift. No hydrocephalus or
extra-axial fluid collection.

Vascular: No hyperdense vessel. Scattered vascular calcifications
noted within the carotid siphons.

Skull: Scalp soft tissues and calvarium within normal limits.

Sinuses: Scattered mucosal thickening noted within the ethmoidal air
cells. Small air-fluid level noted within the right sphenoid sinus.
Paranasal sinuses are otherwise clear. No mastoid effusion.

Orbits: Globes and orbital soft tissues within normal limits.

Review of the MIP images confirms the above findings

CTA NECK FINDINGS

Aortic arch: Visualized aortic arch within normal limits for caliber
with normal 3 vessel morphology. No hemodynamically significant
stenosis about the origin the great vessels.

Right carotid system: Right common and internal carotid arteries
widely patent without stenosis, dissection or occlusion.

Left carotid system: Left common and internal carotid arteries
widely patent without stenosis, dissection or occlusion.

Vertebral arteries: Both vertebral arteries arise from subclavian
arteries. No proximal subclavian artery stenosis. Moderate narrowing
of the left V2 segment at the level of C3 related to an extrinsic
compression from adjacent uncovertebral disease. Vertebral arteries
otherwise widely patent without stenosis, dissection or occlusion.

Skeleton: No acute osseous finding. No discrete or worrisome osseous
lesions. Hyperostosis frontalis interna noted.

Other neck: No other acute soft tissue abnormality within the neck.
No mass or adenopathy.

Upper chest: Which lies upper chest demonstrates no acute finding.

Review of the MIP images confirms the above findings

CTA HEAD FINDINGS

Anterior circulation: Both internal carotid arteries widely patent
to the termini without stenosis. A1 segments widely patent. Normal
anterior communicating artery complex. Both anterior cerebral
arteries widely patent to their distal aspects without stenosis. No
M1 stenosis or occlusion. Normal MCA bifurcations. Distal MCA
branches well perfused and symmetric.

Posterior circulation: Both V4 segments patent to the
vertebrobasilar junction without stenosis. Neither PICA origin well
visualized. Basilar widely patent to its distal aspect without
stenosis. Superior cerebellar arteries patent bilaterally. Both PCAs
primarily supplied via the basilar and are well perfused to there
distal aspects.

Venous sinuses: Patent allowing for timing contrast bolus.

Anatomic variants: None significant.  No intracranial aneurysm.

Review of the MIP images confirms the above findings
IMPRESSION: CT HEAD IMPRESSION:

1. No acute intracranial abnormality. Recently identified signal
changes at the left parieto-occipital region not visible by CT.
2. Age-related cerebral atrophy with mild chronic small vessel
ischemic disease.

CTA HEAD AND NECK IMPRESSION:

Negative CTA of the head and neck. No large vessel occlusion,
hemodynamically significant stenosis, or other acute vascular
abnormality.

## 2020-11-21 IMAGING — DX DG ABDOMEN 1V
3 series · 3 of 3 positions shown · non-contrast
Comparison: None.

CLINICAL DATA: Nausea

EXAM:
ABDOMEN - 1 VIEW

[abdomen kub (1 of 3)]
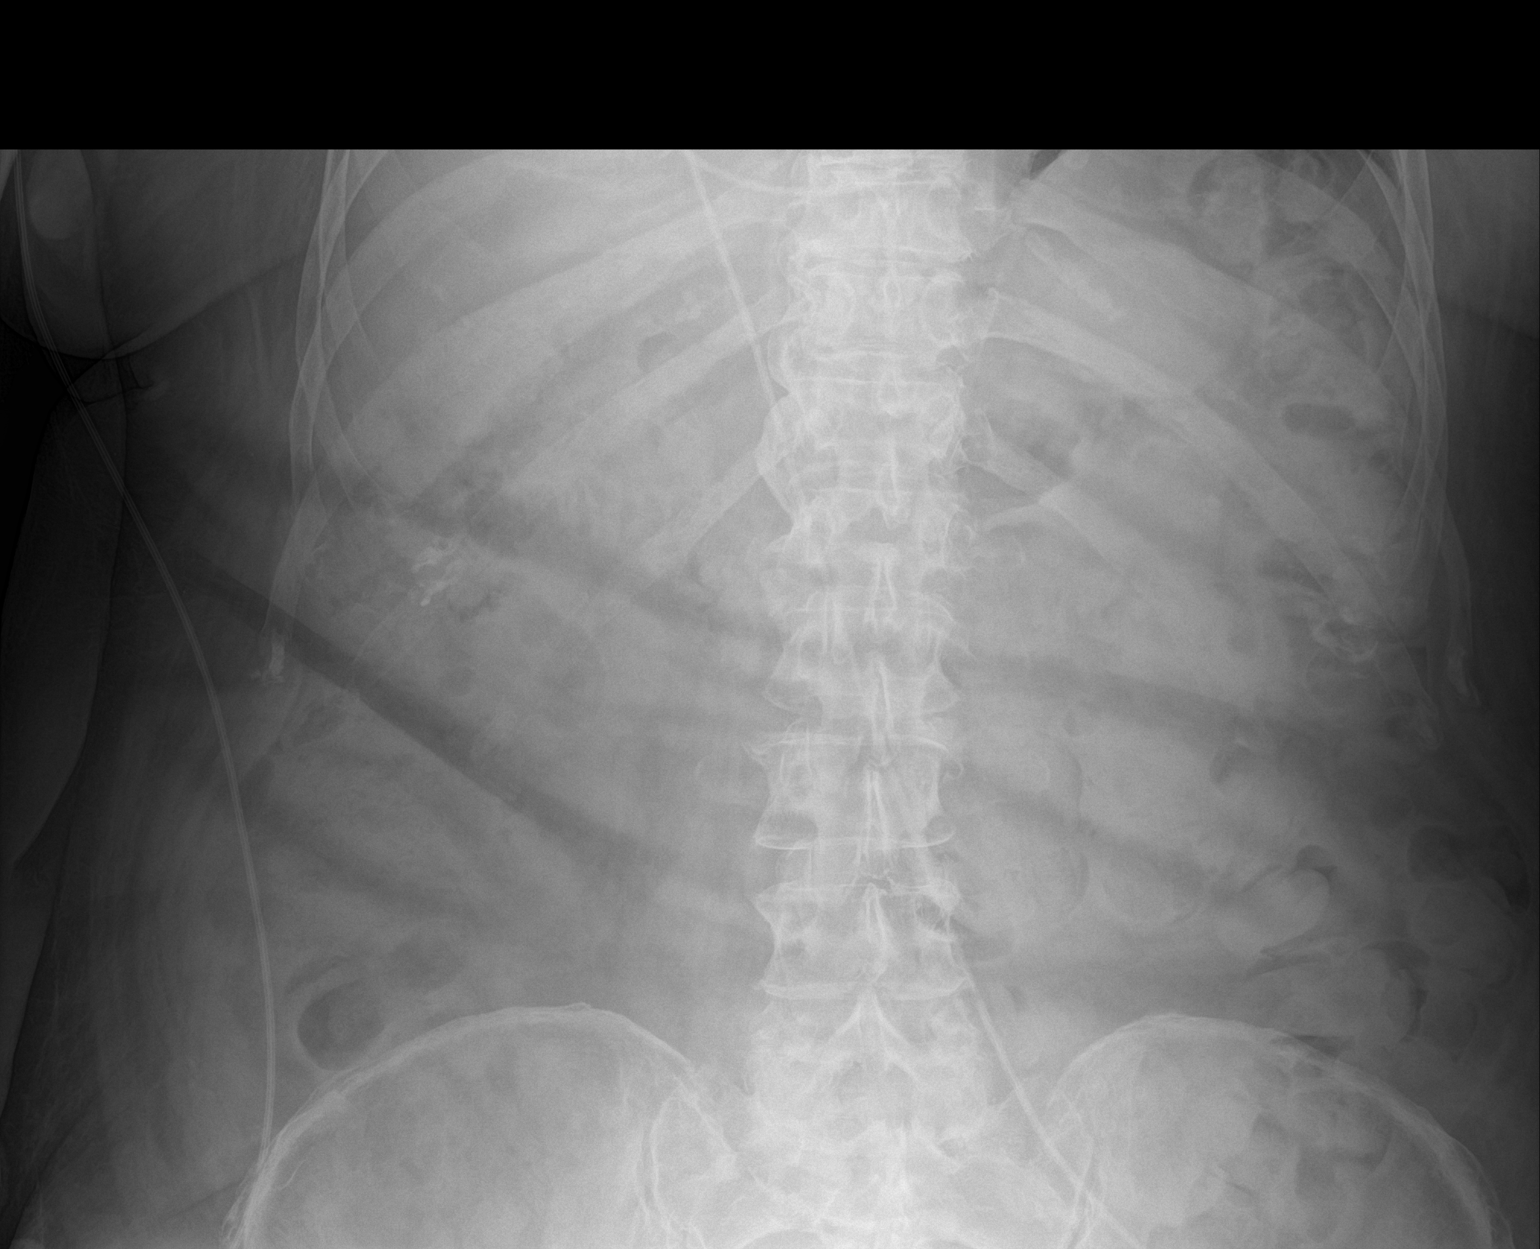

[abdomen kub (2 of 3)]
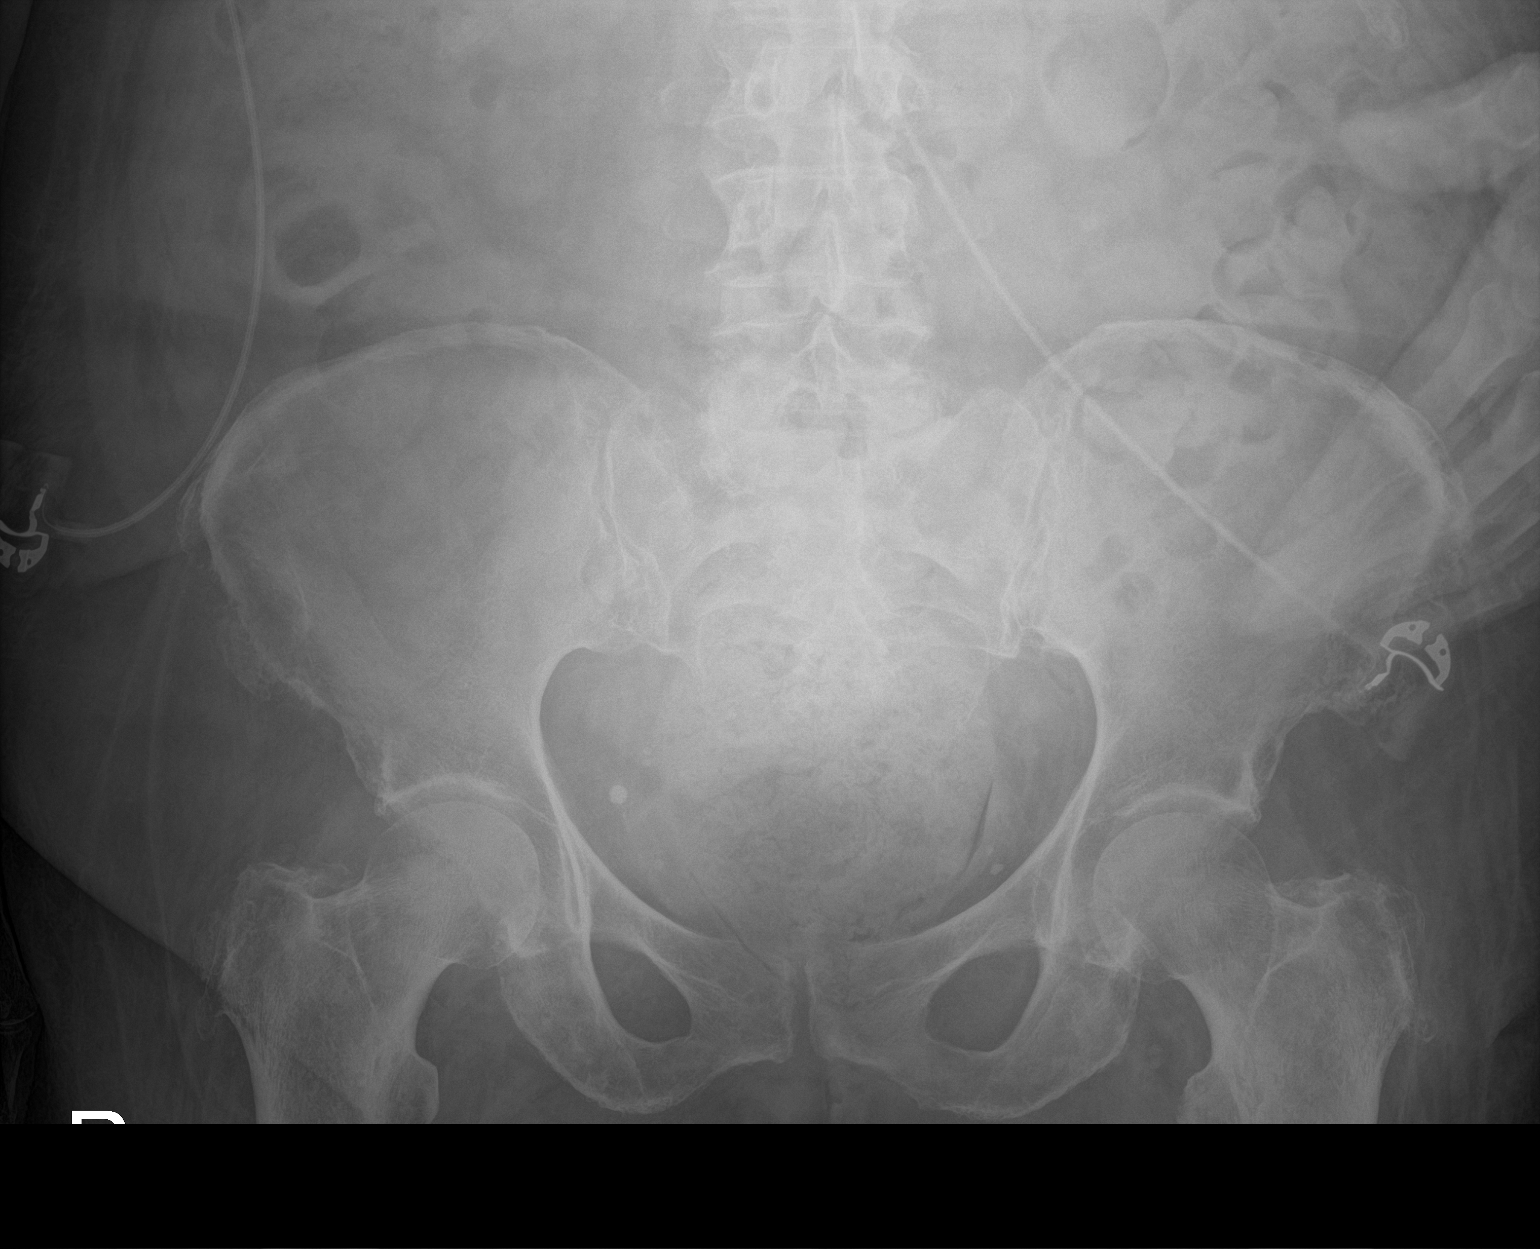

[abdomen kub (3 of 3)]
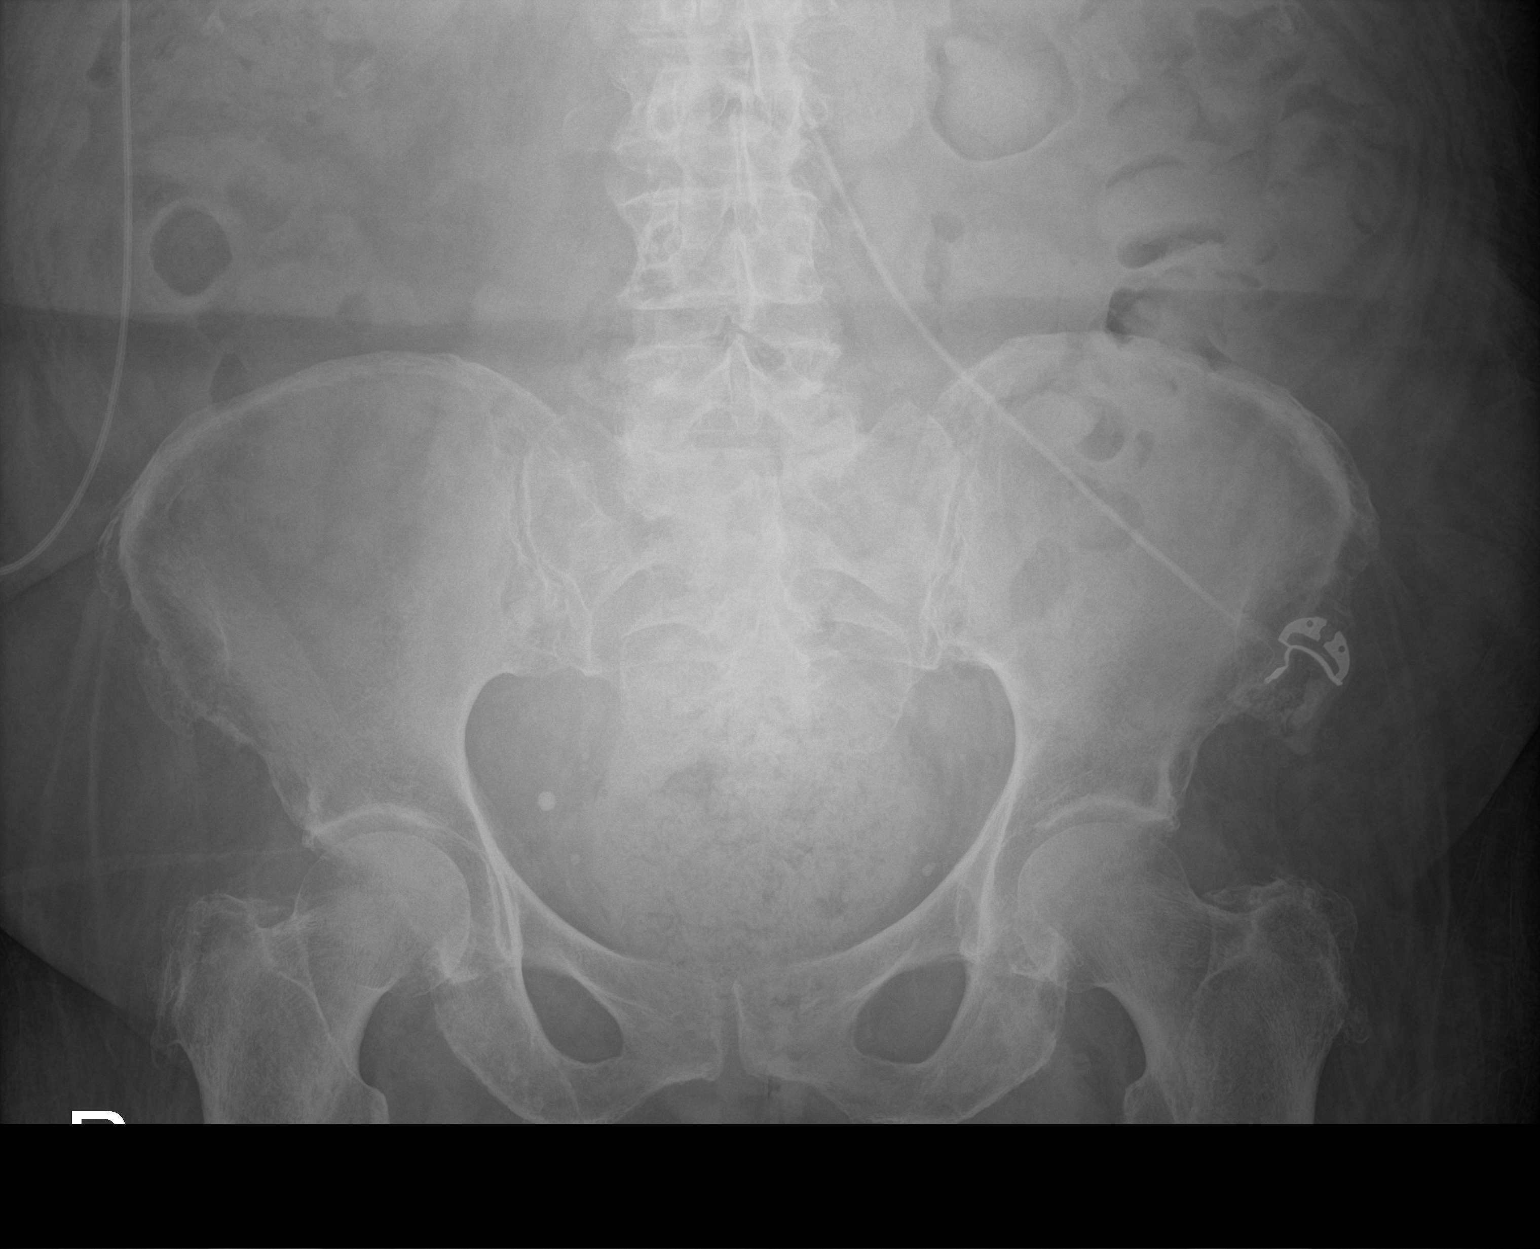

[3 of 3 positions shown; findings below may reference images not displayed]

FINDINGS: There is diffuse stool throughout colon. No bowel dilatation or
air-fluid level to suggest bowel obstruction. No free air. There are
pelvic calcifications consistent with phleboliths. Visualized lung
bases clear.
IMPRESSION: Diffuse stool throughout colon. Question a degree of underlying
constipation. No bowel obstruction or free air.

## 2020-11-21 SURGERY — ECHOCARDIOGRAM, TRANSESOPHAGEAL
Anesthesia: Monitor Anesthesia Care

## 2020-11-21 MED ORDER — LACTULOSE 10 GM/15ML PO SOLN
30.0000 g | Freq: Two times a day (BID) | ORAL | Status: DC
  Administered 2020-11-22 (×2): 30 g via ORAL
  Filled 2020-11-21 (×4): qty 60

## 2020-11-21 MED ORDER — HALOPERIDOL LACTATE 5 MG/ML IJ SOLN
5.0000 mg | Freq: Two times a day (BID) | INTRAMUSCULAR | Status: DC | PRN
  Administered 2020-11-21: 5 mg via INTRAVENOUS

## 2020-11-21 MED ORDER — LORAZEPAM 2 MG/ML IJ SOLN
1.0000 mg | Freq: Two times a day (BID) | INTRAMUSCULAR | Status: DC | PRN
  Administered 2020-11-21 – 2020-11-22 (×3): 1 mg via INTRAVENOUS
  Filled 2020-11-21 (×3): qty 1

## 2020-11-21 MED ORDER — IOHEXOL 350 MG/ML SOLN
75.0000 mL | Freq: Once | INTRAVENOUS | Status: AC | PRN
  Administered 2020-11-21: 75 mL via INTRAVENOUS

## 2020-11-21 MED ORDER — VALPROATE SODIUM 100 MG/ML IV SOLN
500.0000 mg | Freq: Two times a day (BID) | INTRAVENOUS | Status: DC
  Administered 2020-11-21 – 2020-11-24 (×6): 500 mg via INTRAVENOUS
  Filled 2020-11-21 (×7): qty 5

## 2020-11-21 MED ORDER — MAGNESIUM HYDROXIDE 400 MG/5ML PO SUSP
30.0000 mL | Freq: Two times a day (BID) | ORAL | Status: DC
  Filled 2020-11-21 (×2): qty 30

## 2020-11-21 MED ORDER — HALOPERIDOL LACTATE 5 MG/ML IJ SOLN
5.0000 mg | Freq: Four times a day (QID) | INTRAMUSCULAR | Status: DC | PRN
  Filled 2020-11-21: qty 1

## 2020-11-21 MED ORDER — DOCUSATE SODIUM 100 MG PO CAPS
200.0000 mg | ORAL_CAPSULE | Freq: Two times a day (BID) | ORAL | Status: DC
  Administered 2020-11-22 – 2020-12-02 (×17): 200 mg via ORAL
  Filled 2020-11-21 (×21): qty 2

## 2020-11-21 MED ORDER — BISACODYL 10 MG RE SUPP
10.0000 mg | Freq: Once | RECTAL | Status: DC
  Filled 2020-11-21: qty 1

## 2020-11-21 NOTE — Progress Notes (Addendum)
Patient continues to be agitated, combative and paranoid this morning and afternoon. Refused some of my assessments. PRN Ativan 1 mg given to help calm her down for CT scan, EEG, vitals and CGB check, all of which were successfully taken.

## 2020-11-21 NOTE — Progress Notes (Signed)
Patient very aggressive. 2 techs needs through entire hookup

## 2020-11-21 NOTE — Procedures (Signed)
   PROCEDURE: EEG routine 22 minutes with video study   DATES OF TEST: 11/21/2020   REASON FOR TEST: acute encephalopathy   AED/Sedative MEDICATIONS: n/a   TECHNIQUE: This is an 18 channel digital EEG recording using the standard international 10/20 system of electrode placement with one channel EKG recording. Pt was awake during this recording.   FINDINGS:  Background rhythm: symmetric diffuse 4-8 Hz, mostly myogenic artifact. Amplitude :  low Continuity: continuous Breach effect:  NO Variability: YES Reactivity: YES Rhythmic delta activity: NO Periodic discharges: NO Sporadic epileptiform discharges: NO Electrographic/electroclinical seizures: NO   Limited EKG reveals no abnormalities.      IMPRESSION:  This is an abnormal study due to - 1. Moderate to mild Generalized slowing is a non-specific finding consistent with a generalized disturbance of cerebral functioning including toxic, metabolic, or structural abnormalities that are multi-focal or diffuse. 2. No definite epileptiform discharges or electrographic seizures noted.

## 2020-11-21 NOTE — Progress Notes (Signed)
Subjective:  Multiple complaints  Antibiotics:  Anti-infectives (From admission, onward)   Start     Dose/Rate Route Frequency Ordered Stop   11/20/20 2000  DAPTOmycin (CUBICIN) 700 mg in sodium chloride 0.9 % IVPB        8 mg/kg  88 kg 128 mL/hr over 30 Minutes Intravenous Daily 11/20/20 1657     11/18/20 2200  vancomycin (VANCOREADY) IVPB 1250 mg/250 mL  Status:  Discontinued        1,250 mg 166.7 mL/hr over 90 Minutes Intravenous Every 24 hours 11/17/20 2345 11/20/20 1644   11/18/20 0000  ampicillin (OMNIPEN) 2 g in sodium chloride 0.9 % 100 mL IVPB  Status:  Discontinued        2 g 300 mL/hr over 20 Minutes Intravenous Every 4 hours 11/17/20 2319 11/18/20 1520   11/17/20 2345  vancomycin (VANCOCIN) IVPB 1000 mg/200 mL premix        1,000 mg 200 mL/hr over 60 Minutes Intravenous  Once 11/17/20 2331 11/18/20 0155   11/17/20 2330  cefTRIAXone (ROCEPHIN) 2 g in sodium chloride 0.9 % 100 mL IVPB  Status:  Discontinued        2 g 200 mL/hr over 30 Minutes Intravenous Every 12 hours 11/17/20 2319 11/18/20 1520      Medications: Scheduled Meds: . amLODipine  10 mg Oral Daily  . apixaban  5 mg Oral BID  . atorvastatin  10 mg Oral Daily  . bisacodyl  10 mg Rectal Daily  . bisacodyl  10 mg Rectal Once  . cloNIDine  0.2 mg Transdermal Weekly  . docusate sodium  200 mg Oral BID  . insulin aspart  0-15 Units Subcutaneous Q6H  . lactulose  30 g Oral BID  . magnesium hydroxide  30 mL Oral BID  . multivitamin with minerals  1 tablet Oral Daily  . pantoprazole  40 mg Oral Daily  . polyethylene glycol  17 g Oral BID  . QUEtiapine  200 mg Oral QHS  . QUEtiapine  50 mg Oral QHS   Continuous Infusions: . DAPTOmycin (CUBICIN)  IV Stopped (11/20/20 2217)  . valproate sodium 500 mg (11/21/20 1551)   PRN Meds:.acetaminophen **OR** acetaminophen, haloperidol lactate, hydrALAZINE, LORazepam, [DISCONTINUED] ondansetron **OR** ondansetron (ZOFRAN) IV,  QUEtiapine    Objective: Weight change:   Intake/Output Summary (Last 24 hours) at 11/21/2020 1556 Last data filed at 11/21/2020 0708 Gross per 24 hour  Intake 64 ml  Output --  Net 64 ml   Blood pressure (!) 175/90, pulse 100, temperature 98.4 F (36.9 C), temperature source Axillary, resp. rate 20, weight 88 kg, SpO2 98 %. Temp:  [97.7 F (36.5 C)-98.4 F (36.9 C)] 98.4 F (36.9 C) (04/11 1159) Pulse Rate:  [73-100] 100 (04/11 1159) Resp:  [19-20] 20 (04/11 1159) BP: (126-191)/(59-101) 175/90 (04/11 1159) SpO2:  [98 %-99 %] 98 % (04/11 1159)  Physical Exam: Physical Exam Constitutional:      General: She is not in acute distress.    Appearance: She is well-developed. She is not diaphoretic.  HENT:     Head: Normocephalic and atraumatic.     Right Ear: External ear normal.     Left Ear: External ear normal.     Mouth/Throat:     Pharynx: No oropharyngeal exudate.  Eyes:     General: No scleral icterus.    Extraocular Movements: Extraocular movements intact.     Conjunctiva/sclera: Conjunctivae normal.  Cardiovascular:     Rate  and Rhythm: Normal rate and regular rhythm.  Pulmonary:     Effort: Pulmonary effort is normal. No respiratory distress.     Breath sounds: Normal breath sounds. No wheezing.  Abdominal:     General: Bowel sounds are normal. There is no distension.     Palpations: Abdomen is soft.  Musculoskeletal:        General: No tenderness. Normal range of motion.  Lymphadenopathy:     Cervical: No cervical adenopathy.  Skin:    General: Skin is warm and dry.     Coloration: Skin is not pale.     Findings: No erythema or rash.  Neurological:     Mental Status: She is alert.     Motor: No abnormal muscle tone.     Coordination: Coordination normal.  Psychiatric:        Mood and Affect: Mood is anxious. Affect is inappropriate.        Speech: Speech is tangential.        Thought Content: Thought content is paranoid and delusional.         Cognition and Memory: Cognition is impaired.      CBC:    BMET Recent Labs    11/19/20 1933 11/21/20 0245  NA 139 141  K 4.2 3.6  CL 104 106  CO2 25 29  GLUCOSE 83 139*  BUN 19 20  CREATININE 0.55 0.76  CALCIUM 8.8* 9.0     Liver Panel  Recent Labs    11/19/20 1933 11/21/20 0245  PROT 6.5 5.4*  ALBUMIN 3.2* 2.7*  AST 18 12*  ALT 12 13  ALKPHOS 42 38  BILITOT 1.2 0.7       Sedimentation Rate Recent Labs    11/20/20 0208  ESRSEDRATE 7   C-Reactive Protein Recent Labs    11/20/20 0208 11/21/20 0245  CRP 1.1* 1.2*    Micro Results: Recent Results (from the past 720 hour(s))  Urine culture     Status: None   Collection Time: 11/17/20  5:14 PM   Specimen: Urine, Random  Result Value Ref Range Status   Specimen Description   Final    URINE, RANDOM Performed at [facility], [address], [place], [place] [address]    Special Requests   Final    NONE Performed at [facility], [address], [place], [place] [address]    Culture   Final    NO GROWTH Performed at [facility], [address], [place], [place] [address]    Report Status 11/19/2020 FINAL  Final  Culture, blood (routine x 2)     Status: None (Preliminary result)   Collection Time: 11/17/20  6:42 PM   Specimen: BLOOD  Result Value Ref Range Status   Specimen Description   Final    BLOOD LEFT ANTECUBITAL Performed at [facility], [address], [place], [place] [address]    Special Requests   Final    BOTTLES DRAWN AEROBIC AND ANAEROBIC Blood Culture adequate volume Performed at [facility], [address], [place], [place] [address]    Culture   Final    NO GROWTH 4 DAYS Performed at [facility], [address], [place], [place] [address]    Report Status PENDING  Incomplete  Culture, blood (routine x 2)     Status: Abnormal   Collection Time: 11/17/20  6:45 PM   Specimen:  BLOOD LEFT FOREARM  Result  Value Ref Range Status   Specimen Description   Final    BLOOD LEFT FOREARM Performed at [facility], [address] [place], [place] [address]    Special Requests   Final    BOTTLES DRAWN AEROBIC AND ANAEROBIC Blood Culture adequate volume Performed at [facility], [address], [place], [place] [address]    Culture  Setup Time   Final    GRAM POSITIVE COCCI IN CLUSTERS AEROBIC BOTTLE ONLY CRITICAL RESULT CALLED TO, READ BACK BY AND VERIFIED WITH: PHARMD CHRISTINE SHADE ON 11/18/20 AT 1508 BY KJ Performed at [facility], [address] [place], [place] [address]    Culture METHICILLIN RESISTANT STAPHYLOCOCCUS AUREUS (A)  Final   Report Status 11/20/2020 FINAL  Final   Organism ID, Bacteria METHICILLIN RESISTANT STAPHYLOCOCCUS AUREUS  Final      Susceptibility   Methicillin resistant staphylococcus aureus - MIC*    CIPROFLOXACIN 2 INTERMEDIATE Intermediate     ERYTHROMYCIN >=8 RESISTANT Resistant     GENTAMICIN <=0.5 SENSITIVE Sensitive     OXACILLIN >=4 RESISTANT Resistant     TETRACYCLINE <=1 SENSITIVE Sensitive     VANCOMYCIN <=0.5 SENSITIVE Sensitive     TRIMETH/SULFA <=10 SENSITIVE Sensitive     CLINDAMYCIN <=0.25 SENSITIVE Sensitive     RIFAMPIN <=0.5 SENSITIVE Sensitive     Inducible Clindamycin NEGATIVE Sensitive     * METHICILLIN RESISTANT STAPHYLOCOCCUS AUREUS  Blood Culture ID Panel (Reflexed)     Status: Abnormal   Collection Time: 11/17/20  6:45 PM  Result Value Ref Range Status   Enterococcus faecalis NOT DETECTED NOT DETECTED Final   Enterococcus Faecium NOT DETECTED NOT DETECTED Final   Listeria monocytogenes NOT DETECTED NOT DETECTED Final   Staphylococcus species DETECTED (A) NOT DETECTED Final    Comment: CRITICAL RESULT CALLED TO, READ BACK BY AND VERIFIED WITH: PHARMD CHRISTINE SHADE ON 11/18/20 AT 1508 BY KJ    Staphylococcus aureus (BCID) DETECTED (A) NOT DETECTED Final     Comment: Methicillin (oxacillin)-resistant Staphylococcus aureus (MRSA). MRSA is predictably resistant to beta-lactam antibiotics (except ceftaroline). Preferred therapy is vancomycin unless clinically contraindicated. Patient requires contact precautions if  hospitalized. CRITICAL RESULT CALLED TO, READ BACK BY AND VERIFIED WITH: PHARMD CHRISTINE SHADE ON 11/18/20 AT 1508 BY KJ    Staphylococcus epidermidis NOT DETECTED NOT DETECTED Final   Staphylococcus lugdunensis NOT DETECTED NOT DETECTED Final   Streptococcus species NOT DETECTED NOT DETECTED Final   Streptococcus agalactiae NOT DETECTED NOT DETECTED Final   Streptococcus pneumoniae NOT DETECTED NOT DETECTED Final   Streptococcus pyogenes NOT DETECTED NOT DETECTED Final   A.calcoaceticus-baumannii NOT DETECTED NOT DETECTED Final   Bacteroides fragilis NOT DETECTED NOT DETECTED Final   Enterobacterales NOT DETECTED NOT DETECTED Final   Enterobacter cloacae complex NOT DETECTED NOT DETECTED Final   Escherichia coli NOT DETECTED NOT DETECTED Final   Klebsiella aerogenes NOT DETECTED NOT DETECTED Final   Klebsiella oxytoca NOT DETECTED NOT DETECTED Final   Klebsiella pneumoniae NOT DETECTED NOT DETECTED Final   Proteus species NOT DETECTED NOT DETECTED Final   Salmonella species NOT DETECTED NOT DETECTED Final   Serratia marcescens NOT DETECTED NOT DETECTED Final   Haemophilus influenzae NOT DETECTED NOT DETECTED Final   Neisseria meningitidis NOT DETECTED NOT DETECTED Final   Pseudomonas aeruginosa NOT DETECTED NOT DETECTED Final   Stenotrophomonas maltophilia NOT DETECTED NOT DETECTED Final   Candida albicans NOT DETECTED NOT DETECTED Final   Candida auris NOT DETECTED  NOT DETECTED Final   Candida glabrata NOT DETECTED NOT DETECTED Final   Candida krusei NOT DETECTED NOT DETECTED Final   Candida parapsilosis NOT DETECTED NOT DETECTED Final   Candida tropicalis NOT DETECTED NOT DETECTED Final   Cryptococcus neoformans/gattii NOT  DETECTED NOT DETECTED Final   Meth resistant mecA/C and MREJ DETECTED (A) NOT DETECTED Final    Comment: CRITICAL RESULT CALLED TO, READ BACK BY AND VERIFIED WITH: PHARMD CHRISTINE SHADE ON 11/18/20 AT 1508 BY KJ Performed at [facility], [address] [place], [place] [address]   Resp Panel by RT-PCR (Flu A&B, Covid) Nasopharyngeal Swab     Status: None   Collection Time: 11/17/20  7:12 PM   Specimen: Nasopharyngeal Swab; Nasopharyngeal(NP) swabs in vial transport medium  Result Value Ref Range Status   SARS Coronavirus 2 by RT PCR NEGATIVE NEGATIVE Final    Comment: (NOTE) SARS-CoV-2 target nucleic acids are NOT DETECTED.  The SARS-CoV-2 RNA is generally detectable in upper respiratory specimens during the acute phase of infection. The lowest concentration of SARS-CoV-2 viral copies this assay can detect is 138 copies/mL. A negative result does not preclude SARS-Cov-2 infection and should not be used as the sole basis for treatment or other patient management decisions. A negative result may occur with  improper specimen collection/handling, submission of specimen other than nasopharyngeal swab, presence of viral mutation(s) within the areas targeted by this assay, and inadequate number of viral copies(<138 copies/mL). A negative result must be combined with clinical observations, patient history, and epidemiological information. The expected result is Negative.  Fact Sheet for Patients:  BloggerCourse.com  Fact Sheet for Healthcare Providers:  SeriousBroker.it  This test is no t yet approved or cleared by the [place] FDA and  has been authorized for detection and/or diagnosis of SARS-CoV-2 by FDA under an Emergency Use Authorization (EUA). This EUA will remain  in effect (meaning this test can be used) for the duration of the COVID-19 declaration under Section 564(b)(1) of the Act, 21 U.S.C.section  360bbb-3(b)(1), unless the authorization is terminated  or revoked sooner.       Influenza A by PCR NEGATIVE NEGATIVE Final   Influenza B by PCR NEGATIVE NEGATIVE Final    Comment: (NOTE) The Xpert Xpress SARS-CoV-2/FLU/RSV plus assay is intended as an aid in the diagnosis of influenza from Nasopharyngeal swab specimens and should not be used as a sole basis for treatment. Nasal washings and aspirates are unacceptable for Xpert Xpress SARS-CoV-2/FLU/RSV testing.  Fact Sheet for Patients: BloggerCourse.com  Fact Sheet for Healthcare Providers: SeriousBroker.it  This test is not yet approved or cleared by the [place] FDA and has been authorized for detection and/or diagnosis of SARS-CoV-2 by FDA under an Emergency Use Authorization (EUA). This EUA will remain in effect (meaning this test can be used) for the duration of the COVID-19 declaration under Section 564(b)(1) of the Act, 21 U.S.C. section 360bbb-3(b)(1), unless the authorization is terminated or revoked.  Performed at [facility], [address], [place], [place] [address]   CSF culture     Status: None (Preliminary result)   Collection Time: 11/18/20  1:56 PM   Specimen: CSF; Cerebrospinal Fluid  Result Value Ref Range Status   Specimen Description   Final    CSF Performed at [facility], [address] [place], [place] [address]    Special Requests   Final    LP Performed at [facility], [address] [place], [place] [address]    Gram Stain   Final    WBC PRESENT, PREDOMINANTLY MONONUCLEAR NO ORGANISMS SEEN CYTOSPIN SMEAR Gram Stain Report Called to,Read Back By and Verified With: H.JENKINS, RN AT 1614 ON 04.08.22 BY N.THOMPSON Performed at [facility], [address], [place], [place] [address]    Culture   Final    NO GROWTH 3 DAYS Performed at [facility], [address] [place], [place] [address]    Report Status PENDING  Incomplete  Culture, blood (routine x 2)     Status: None (Preliminary result)   Collection Time: 11/18/20  7:57 PM   Specimen: BLOOD  Result Value Ref Range Status   Specimen Description   Final    BLOOD RIGHT ANTECUBITAL Performed at [facility], [address] [place], [place] [address]    Special Requests   Final    BOTTLES DRAWN AEROBIC ONLY Blood Culture adequate volume Performed at [facility], [address] [place], [place] [address]    Culture   Final    NO GROWTH 2 DAYS Performed at [facility], [address], [place], [place] [address]    Report Status PENDING  Incomplete  Culture, blood (routine x 2)     Status: None (Preliminary result)   Collection Time: 11/18/20  7:57 PM   Specimen: BLOOD RIGHT HAND  Result Value Ref Range Status   Specimen Description   Final    BLOOD RIGHT HAND Performed at [facility], [address], [place], [place] [address]    Special Requests   Final    BOTTLES DRAWN AEROBIC ONLY Blood Culture adequate volume Performed at [facility], [address] [place], [place] [address]    Culture   Final    NO GROWTH 2 DAYS Performed at [facility], [address] [place], [place] [address]    Report Status PENDING  Incomplete    Studies/Results: DG Abd 1 View  Result Date: 11/21/2020 CLINICAL DATA:  Nausea EXAM: ABDOMEN - 1 VIEW COMPARISON:  None. FINDINGS: There is diffuse stool throughout colon. No bowel dilatation or air-fluid level to suggest bowel obstruction. No free air. There are pelvic calcifications consistent with phleboliths. Visualized lung bases clear. IMPRESSION: Diffuse stool throughout colon. Question a degree of underlying constipation. No bowel obstruction or free air. Electronically Signed   By: Bretta Bang III M.D.   On: 11/21/2020 12:18   EEG  adult  Result Date: 11/21/2020 Plancher, Genia Del, MD     11/21/2020  3:50 PM PROCEDURE: EEG routine 22 minutes with video study  DATES OF TEST: 11/21/2020  REASON FOR TEST: acute encephalopathy  AED/Sedative MEDICATIONS: n/a  TECHNIQUE: This is an 18 channel digital EEG recording using the standard international 10/20 system of electrode placement with one channel EKG recording. Pt was awake during this recording.  FINDINGS: Background rhythm: symmetric diffuse 4-8 Hz, mostly myogenic artifact. Amplitude :  low Continuity: continuous Breach effect:  NO Variability: YES Reactivity: YES Rhythmic delta activity: NO Periodic discharges: NO Sporadic epileptiform discharges: NO Electrographic/electroclinical seizures: NO  Limited EKG reveals no abnormalities.   IMPRESSION: This is an abnormal study due to - 1. Moderate to mild Generalized slowing is a non-specific finding consistent with a generalized disturbance of cerebral functioning including toxic, metabolic, or structural abnormalities that are multi-focal or diffuse. 2. No definite epileptiform discharges or electrographic seizures noted.  ECHOCARDIOGRAM COMPLETE  Result Date: 11/19/2020    ECHOCARDIOGRAM REPORT   Patient Name:   NANNA ERTLE Date of Exam: 11/19/2020 Medical Rec #:  [id number]      Height:       66.0 in Accession #:    [id number]     Weight:       195.2 lb Date of Birth:  06/26/1946       BSA:          1.979 m Patient Age:    74 years       BP:           145/76 mmHg Patient Gender: F              HR:           75 bpm. Exam Location:  Inpatient Procedure: 2D Echo Indications:    bacteremia  History:        Patient has prior history of Echocardiogram examinations, most                 recent 12/23/2019. CHF, Arrythmias:Atrial Fibrillation,                 Signs/Symptoms:Bacteremia; Risk Factors:Diabetes.  Sonographer:    Delcie Roch Referring Phys: [id number] LATIF [facility]  Sonographer Comments: Image acquisition challenging due to  uncooperative patient and Image acquisition challenging due to patient body habitus. IMPRESSIONS  1. Limited study due to poor acoustic windows. Left ventricular ejection fraction, by estimation, is 60 to 65%. The left ventricle has normal function. The left ventricle has no regional wall motion abnormalities. There is mild concentric left ventricular hypertrophy. Left ventricular diastolic parameters are consistent with Grade I diastolic dysfunction (impaired relaxation).  2. Right ventricular systolic function is mildly reduced. The right ventricular size is mildly-to-moderately enlarged.  3. The mitral valve is normal in structure. Trivial mitral valve regurgitation. No evidence of mitral stenosis.  4. The aortic valve is tricuspid. There is mild calcification of the aortic valve. There is mild thickening of the aortic valve. Aortic valve regurgitation is not visualized. Mild aortic valve sclerosis is present, with no evidence of aortic valve stenosis.  5. No valvular vegetations visualized on surface echo. Pending clinical suspicion for infective endocarditis, can consider TEE for further evaluation. Comparison(s): Compared to prior TTE on 12/23/19, the RV systolic function appears mildly improved. Otherwise there is no significant change. Conclusion(s)/Recommendation(s): No evidence of valvular vegetations on this transthoracic echocardiogram. Would recommend a transesophageal echocardiogram to exclude infective endocarditis if clinically indicated. FINDINGS  Left Ventricle: Limited study due to poor acoustic windows. Left ventricular ejection fraction, by estimation, is 60 to 65%. The left ventricle has normal function. The left ventricle has no regional wall motion abnormalities. Definity contrast agent was given IV to delineate the left ventricular endocardial borders. The left ventricular internal cavity size was normal in size. There is mild concentric left ventricular hypertrophy. Left ventricular  diastolic parameters are consistent with Grade I diastolic dysfunction (impaired relaxation). Right Ventricle: The right ventricular size is mildly-to-moderately enlarged. Right vetricular wall thickness was not well visualized. Right ventricular systolic function is mildly reduced. Left Atrium: Left atrial size was normal in size. Right Atrium: Right atrial size was normal in size. Pericardium: There is no evidence of pericardial effusion. Mitral Valve: The mitral valve is normal in structure. There is mild thickening of the mitral valve leaflet(s). There is mild calcification of the mitral valve leaflet(s). Trivial mitral valve regurgitation. No evidence of mitral  valve stenosis. Tricuspid Valve: The tricuspid valve is normal in structure. Tricuspid valve regurgitation is trivial. Aortic Valve: The aortic valve is tricuspid. There is mild calcification of the aortic valve. There is mild thickening of the aortic valve. Aortic valve regurgitation is not visualized. Mild aortic valve sclerosis is present, with no evidence of aortic valve stenosis. Pulmonic Valve: The pulmonic valve was not well visualized. Pulmonic valve regurgitation is not visualized. Aorta: The aortic root and ascending aorta are structurally normal, with no evidence of dilitation. IAS/Shunts: No atrial level shunt detected by color flow Doppler.  LEFT VENTRICLE PLAX 2D LVIDd:         4.26 cm LVIDs:         2.71 cm LV PW:         1.17 cm LV IVS:        1.06 cm LVOT diam:     1.80 cm LVOT Area:     2.54 cm  RIGHT VENTRICLE RV S prime:     9.90 cm/s LEFT ATRIUM         Index LA diam:    3.80 cm 1.92 cm/m   AORTA Ao Root diam: 2.90 cm Ao Asc diam:  3.40 cm  SHUNTS Systemic Diam: 1.80 cm Laurance Flatten MD Electronically signed by Laurance Flatten MD Signature Date/Time: 11/19/2020/5:00:20 PM    Final       Assessment/Plan:  INTERVAL HISTORY:   Patient continues to be delusional  Principal Problem:   Bipolar affective disorder,  currently manic, mild (HCC) Active Problems:   Acute metabolic encephalopathy   Severe sepsis (HCC)   AF (paroxysmal atrial fibrillation) (HCC)   Sacral wound   Chronic diastolic CHF (congestive heart failure) (HCC)   Bipolar disorder (HCC)   SIRS (systemic inflammatory response syndrome) (HCC)   Nausea and vomiting   Mixed diabetic hyperlipidemia associated with type 2 diabetes mellitus (HCC)   Type 2 diabetes mellitus with hyperglycemia, with long-term current use of insulin (HCC)   Constipation   Encephalopathy acute   MRSA bacteremia    Becky Hendricks is a 75 y.o. 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  2 can be considered as a "complicated "  Repeat blood cultures have been taken and no growth  2D echocardiogram does not show vegetations  Given concerns about "red man" syndrome we changed to daptomycin   MRI showed a possible subacute infarct  I would not get a TEE but simply treat her for 4 to 6 weeks with systemic antibiotics  Her schizophrenia and confabulation make it difficult to elicit them her if she has other problems besides her decubitus ulcer and her bacteremia.     LOS: 3 days   Acey Lav 11/21/2020, 3:56 PM

## 2020-11-21 NOTE — Consult Note (Signed)
[facility] Face-to-Face Psychiatry Consult   Reason for Consult: Severe bipolar disorder with psychosis Referring Physician:  Dr Thedore Mins Patient Identification: Becky Hendricks MRN:  [id number] Principal Diagnosis: Bipolar affective disorder, currently manic, mild (HCC) Diagnosis:  Principal Problem:   Bipolar affective disorder, currently manic, mild (HCC) Active Problems:   Acute metabolic encephalopathy   Severe sepsis (HCC)   AF (paroxysmal atrial fibrillation) (HCC)   Sacral wound   Chronic diastolic CHF (congestive heart failure) (HCC)   Bipolar disorder (HCC)   SIRS (systemic inflammatory response syndrome) (HCC)   Nausea and vomiting   Mixed diabetic hyperlipidemia associated with type 2 diabetes mellitus (HCC)   Type 2 diabetes mellitus with hyperglycemia, with long-term current use of insulin (HCC)   Constipation   Encephalopathy acute   MRSA bacteremia   Total Time spent with patient: 20 minutes  Subjective:   Becky Hendricks is a 75 y.o. female patient admitted with AMS and agitation, history of bipolar disorder. Patient is seen and chart reviewed.  Patient is observed to be lying in bed, in a deep slumber.  Per nursing staff she has been pretty restless, and has not slept.  And requested nursing staff, patient was allowed to continue to rest.  Psychiatry will continue to monitor at this time.  Per chart review patient has Haldol 5 mg IV ordered every 6 as needed for agitation.  She is also receiving quetiapine 250 mg p.o. nightly, and quetiapine 12.5 mg p.o. twice daily as needed for agitation, and valproic acid 500 mg p.o. twice daily.  Her home medications were resumed on date of admission 4/9.   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  Patient is unable to provide a history at this time due to being minimally responsive.   Past Psychiatric History: bipolar  Risk to Self:  none Risk to Others:  none Prior Inpatient Therapy:  unknown Prior Outpatient Therapy:  unknown  Past Medical History:  Past Medical History:  Diagnosis Date  . Atrial fibrillation (HCC)   . Bipolar disorder (HCC)   . Bipolar disorder (HCC) 04/27/2020  . Clostridium difficile diarrhea 08/02/2019  . Dehydration   . Essential hypertension   . Fall 02/11/2020  . Morbid obesity (HCC)   . Osteomyelitis (HCC) 12/03/2019  . Pneumonia due to COVID-19 virus 09/15/2019  . Type 2 diabetes mellitus (HCC)   . Weakness     Past Surgical History:  Procedure Laterality Date  . INCISION AND DRAINAGE PERIRECTAL ABSCESS Left 12/04/2019   Procedure: IRRIGATION AND DEBRIDEMENT LEFT BUTTOCK ABSCESS;  Surgeon: Violeta Gelinas, MD;  Location: [facility] OR;  Service: General;  Laterality: Left;  . INCISION AND DRAINAGE PERIRECTAL ABSCESS Left 12/10/2019   Procedure: REPEAT IRRIGATION AND DEBRIDEMENT BUTTOCK  ABSCESS;  Surgeon: Abigail Miyamoto, MD;  Location: [facility] OR;  Service: General;  Laterality: Left;   Family History:  Family History  Family history unknown: Yes   Family Psychiatric  History: unknown Social History:  Social History   Substance and Sexual Activity  Alcohol Use Not Currently     Social History   Substance and Sexual Activity  Drug Use Not Currently    Social History  Socioeconomic History  . Marital status: Single    Spouse name: Not on file  . Number of children: Not on file  . Years of education: Not on file  . Highest education level: Not on file  Occupational History  . Not on file  Tobacco Use  . Smoking status: Never Smoker  .  Smokeless tobacco: Never Used  Vaping Use  . Vaping Use: Unknown  Substance and Sexual Activity  . Alcohol use: Not Currently  . Drug use: Not Currently  . Sexual activity: Not Currently    Birth control/protection: Post-menopausal  Other Topics Concern  . Not on file  Social History Narrative  . Not on file   Social Determinants of Health   Financial Resource Strain: Not on file  Food Insecurity: Not on file  Transportation Needs: Not on file  Physical Activity: Not on file  Stress: Not on file  Social Connections: Not on file   Additional Social History:    Allergies:   Allergies  Allergen Reactions  . Chlorhexidine Gluconate Itching  . Acetazolamide Er Rash    Labs:  Results for orders placed or performed during the hospital encounter of 11/17/20 (from the past 48 hour(s))  Glucose, capillary     Status: None   Collection Time: 11/19/20  6:54 PM  Result Value Ref Range   Glucose-Capillary 75 70 - 99 mg/dL    Comment: Glucose reference range applies only to samples taken after fasting for at least 8 hours.  CBC with Differential/Platelet     Status: None   Collection Time: 11/19/20  7:33 PM  Result Value Ref Range   WBC 8.1 4.0 - 10.5 K/uL   RBC 4.59 3.87 - 5.11 MIL/uL   Hemoglobin 13.3 12.0 - 15.0 g/dL   HCT 11.9 14.7 - 82.9 %   MCV 90.4 80.0 - 100.0 fL   MCH 29.0 26.0 - 34.0 pg   MCHC 32.0 30.0 - 36.0 g/dL   RDW 56.2 13.0 - 86.5 %   Platelets 242 150 - 400 K/uL   nRBC 0.0 0.0 - 0.2 %   Neutrophils Relative % 55 %   Neutro Abs 4.4 1.7 - 7.7 K/uL   Lymphocytes Relative 28 %   Lymphs Abs 2.3 0.7 - 4.0 K/uL   Monocytes Relative 11 %   Monocytes Absolute 0.9 0.1 - 1.0 K/uL   Eosinophils Relative 6 %   Eosinophils Absolute 0.4 0.0 - 0.5 K/uL   Basophils Relative 0 %   Basophils Absolute 0.0 0.0 - 0.1 K/uL   Immature Granulocytes 0 %   Abs Immature Granulocytes 0.02 0.00 - 0.07 K/uL    Comment: Performed at [facility], [address], [place], [place] [address]  Comprehensive metabolic panel     Status: Abnormal   Collection Time: 11/19/20  7:33 PM  Result Value Ref Range   Sodium 139 135 - 145 mmol/L   Potassium 4.2 3.5 - 5.1 mmol/L   Chloride 104 98 - 111 mmol/L   CO2 25 22 - 32 mmol/L   Glucose, Bld 83 70 - 99 mg/dL    Comment: Glucose reference range applies only to samples taken after fasting for at least 8 hours.   BUN 19 8 - 23 mg/dL   Creatinine, Ser 6.29 0.44 - 1.00 mg/dL   Calcium 8.8 (L) 8.9 - 10.3 mg/dL   Total Protein 6.5 6.5 - 8.1 g/dL   Albumin 3.2 (L) 3.5 - 5.0 g/dL   AST  18 15 - 41 U/L   ALT 12 0 - 44 U/L   Alkaline Phosphatase 42 38 - 126 U/L   Total Bilirubin 1.2 0.3 - 1.2 mg/dL   GFR, Estimated >16 >10 mL/min    Comment: (NOTE) Calculated using the CKD-EPI Creatinine Equation (2021)    Anion gap 10 5 - 15    Comment: Performed at [facility], [address], [place], [place] [address]  Magnesium     Status: None   Collection Time: 11/19/20  7:33 PM  Result Value Ref Range   Magnesium 1.9 1.7 - 2.4 mg/dL    Comment: Performed at [facility], [address], [place], [place] [address]  Phosphorus     Status: None   Collection Time: 11/19/20  7:33 PM  Result Value Ref Range   Phosphorus 3.0 2.5 - 4.6 mg/dL    Comment: Performed at [facility], [address] [place], [place] [address]  Glucose, capillary     Status: None   Collection Time: 11/20/20 12:27 AM  Result Value Ref Range   Glucose-Capillary 86 70 - 99 mg/dL    Comment: Glucose reference range applies only to samples taken after fasting for at least 8 hours.   Comment 1 Notify RN    Comment 2 Document in Chart   Sedimentation rate     Status: None   Collection Time: 11/20/20  2:08 AM  Result Value Ref Range   Sed Rate 7 0 - 22 mm/hr    Comment: Performed at [facility], [address], [place], [place] [address]  C-reactive protein     Status: Abnormal    Collection Time: 11/20/20  2:08 AM  Result Value Ref Range   CRP 1.1 (H) <1.0 mg/dL    Comment: Performed at [facility], [address] [place], [place] [address]  Glucose, capillary     Status: None   Collection Time: 11/20/20  6:06 AM  Result Value Ref Range   Glucose-Capillary 92 70 - 99 mg/dL    Comment: Glucose reference range applies only to samples taken after fasting for at least 8 hours.   Comment 1 Notify RN    Comment 2 Document in Chart   Glucose, capillary     Status: Abnormal   Collection Time: 11/20/20 11:40 PM  Result Value Ref Range   Glucose-Capillary 173 (H) 70 - 99 mg/dL    Comment: Glucose reference range applies only to samples taken after fasting for at least 8 hours.   Comment 1 Notify RN    Comment 2 Document in Chart   Magnesium     Status: None   Collection Time: 11/21/20  2:45 AM  Result Value Ref Range   Magnesium 1.7 1.7 - 2.4 mg/dL    Comment: Performed at [facility], [address], [place], [place] [address]  Comprehensive metabolic panel     Status: Abnormal   Collection Time: 11/21/20  2:45 AM  Result Value Ref Range   Sodium 141 135 - 145 mmol/L   Potassium 3.6 3.5 - 5.1 mmol/L   Chloride 106 98 - 111 mmol/L   CO2 29 22 - 32 mmol/L   Glucose, Bld 139 (H) 70 - 99 mg/dL    Comment: Glucose reference range applies only to samples taken after fasting for at least 8 hours.   BUN 20 8 - 23 mg/dL   Creatinine, Ser 6.96 0.44 - 1.00  mg/dL   Calcium 9.0 8.9 - 15.4 mg/dL   Total Protein 5.4 (L) 6.5 - 8.1 g/dL   Albumin 2.7 (L) 3.5 - 5.0 g/dL   AST 12 (L) 15 - 41 U/L   ALT 13 0 - 44 U/L   Alkaline Phosphatase 38 38 - 126 U/L   Total Bilirubin 0.7 0.3 - 1.2 mg/dL   GFR, Estimated >00 >86 mL/min    Comment: (NOTE) Calculated using the CKD-EPI Creatinine Equation (2021)    Anion gap 6 5 - 15    Comment: Performed at [facility], [address] [place], [place] [address]  Brain natriuretic peptide     Status: None    Collection Time: 11/21/20  2:45 AM  Result Value Ref Range   B Natriuretic Peptide 47.7 0.0 - 100.0 pg/mL    Comment: Performed at [facility], [address] [place], [place] [address]  CBC with Differential/Platelet     Status: Abnormal   Collection Time: 11/21/20  2:45 AM  Result Value Ref Range   WBC 9.5 4.0 - 10.5 K/uL   RBC 4.12 3.87 - 5.11 MIL/uL   Hemoglobin 11.8 (L) 12.0 - 15.0 g/dL   HCT 71.2 45.8 - 09.9 %   MCV 88.1 80.0 - 100.0 fL   MCH 28.6 26.0 - 34.0 pg   MCHC 32.5 30.0 - 36.0 g/dL   RDW 83.3 82.5 - 05.3 %   Platelets 233 150 - 400 K/uL   nRBC 0.0 0.0 - 0.2 %   Neutrophils Relative % 60 %   Neutro Abs 5.7 1.7 - 7.7 K/uL   Lymphocytes Relative 24 %   Lymphs Abs 2.2 0.7 - 4.0 K/uL   Monocytes Relative 11 %   Monocytes Absolute 1.0 0.1 - 1.0 K/uL   Eosinophils Relative 5 %   Eosinophils Absolute 0.5 0.0 - 0.5 K/uL   Basophils Relative 0 %   Basophils Absolute 0.0 0.0 - 0.1 K/uL   Immature Granulocytes 0 %   Abs Immature Granulocytes 0.03 0.00 - 0.07 K/uL    Comment: Performed at [facility], [address], [place], [place] [address]  C-reactive protein     Status: Abnormal   Collection Time: 11/21/20  2:45 AM  Result Value Ref Range   CRP 1.2 (H) <1.0 mg/dL    Comment: Performed at [facility], [address], [place], [place] [address]  Procalcitonin     Status: None   Collection Time: 11/21/20  2:45 AM  Result Value Ref Range   Procalcitonin <0.10 ng/mL    Comment:        Interpretation: PCT (Procalcitonin) <= 0.5 ng/mL: Systemic infection (sepsis) is not likely. Local bacterial infection is possible. (NOTE)       Sepsis PCT Algorithm           Lower Respiratory Tract                                      Infection PCT Algorithm    ----------------------------     ----------------------------         PCT < 0.25 ng/mL                PCT < 0.10 ng/mL          Strongly encourage             Strongly discourage  discontinuation of  antibiotics    initiation of antibiotics    ----------------------------     -----------------------------       PCT 0.25 - 0.50 ng/mL            PCT 0.10 - 0.25 ng/mL               OR       >80% decrease in PCT            Discourage initiation of                                            antibiotics      Encourage discontinuation           of antibiotics    ----------------------------     -----------------------------         PCT >= 0.50 ng/mL              PCT 0.26 - 0.50 ng/mL               AND        <80% decrease in PCT             Encourage initiation of                                             antibiotics       Encourage continuation           of antibiotics    ----------------------------     -----------------------------        PCT >= 0.50 ng/mL                  PCT > 0.50 ng/mL               AND         increase in PCT                  Strongly encourage                                      initiation of antibiotics    Strongly encourage escalation           of antibiotics                                     -----------------------------                                           PCT <= 0.25 ng/mL                                                 OR                                        >  80% decrease in PCT                                      Discontinue / Do not initiate                                             antibiotics  Performed at [facility], [address], [place], [place] [address]   CK     Status: Abnormal   Collection Time: 11/21/20  2:45 AM  Result Value Ref Range   Total CK 20 (L) 38 - 234 U/L    Comment: Performed at [facility], [address] [place], [place] [address]  Glucose, capillary     Status: Abnormal   Collection Time: 11/21/20  6:00 AM  Result Value Ref Range   Glucose-Capillary 121 (H) 70 - 99 mg/dL    Comment: Glucose reference range applies only to samples taken after fasting for at least 8 hours.   Comment 1  Notify RN    Comment 2 Document in Chart     Current Facility-Administered Medications  Medication Dose Route Frequency Provider Last Rate Last Admin  . acetaminophen (TYLENOL) tablet 650 mg  650 mg Oral Q6H PRN Shalhoub, Deno Lunger, MD       Or  . acetaminophen (TYLENOL) suppository 650 mg  650 mg Rectal Q6H PRN Shalhoub, Deno Lunger, MD      . amLODipine (NORVASC) tablet 10 mg  10 mg Oral Daily Leroy Sea, MD      . apixaban Everlene Balls) tablet 5 mg  5 mg Oral BID Sheikh, Omair Kerkhoven, DO   5 mg at 11/20/20 2143  . atorvastatin (LIPITOR) tablet 10 mg  10 mg Oral Daily Leroy Sea, MD      . bisacodyl (DULCOLAX) suppository 10 mg  10 mg Rectal Daily Susa Raring K, MD      . cloNIDine (CATAPRES - Dosed in mg/24 hr) patch 0.2 mg  0.2 mg Transdermal Weekly Leroy Sea, MD   0.2 mg at 11/20/20 1810  . DAPTOmycin (CUBICIN) 700 mg in sodium chloride 0.9 % IVPB  8 mg/kg Intravenous Q2000 Mosetta Anis, [facility]   Stopped at 11/20/20 2217  . divalproex (DEPAKOTE) DR tablet 500 mg  500 mg Oral BID Audrea Muscat T, NP   500 mg at 11/20/20 2143  . docusate sodium (COLACE) capsule 100 mg  100 mg Oral BID Susa Raring K, MD      . haloperidol lactate (HALDOL) injection 5 mg  5 mg Intravenous Q6H PRN Leroy Sea, MD      . hydrALAZINE (APRESOLINE) injection 10 mg  10 mg Intravenous Q6H PRN Leroy Sea, MD   10 mg at 11/20/20 2111  . insulin aspart (novoLOG) injection 0-15 Units  0-15 Units Subcutaneous Q6H Shalhoub, Deno Lunger, MD   3 Units at 11/20/20 2351  . LORazepam (ATIVAN) injection 1 mg  1 mg Intravenous BID BM PRN Leroy Sea, MD   1 mg at 11/21/20 1135  . multivitamin with minerals tablet 1 tablet  1 tablet Oral Daily Leroy Sea, MD      . ondansetron [facility]) injection 4 mg  4 mg Intravenous Q6H PRN Shalhoub, Deno Lunger, MD      .  pantoprazole (PROTONIX) EC tablet 40 mg  40 mg Oral Daily Susa RaringSingh, Prashant K, MD      . polyethylene glycol (MIRALAX / GLYCOLAX)  packet 17 g  17 g Oral BID Leroy SeaSingh, Prashant K, MD   17 g at 11/20/20 0949  . QUEtiapine (SEROQUEL) tablet 12.5 mg  12.5 mg Oral BID PRN Charm RingsLord, Jamison Y, NP   12.5 mg at 11/20/20 0950  . QUEtiapine (SEROQUEL) tablet 200 mg  200 mg Oral QHS Maryagnes AmosStarkes-Perry, Yaslin Kirtley S, FNP   200 mg at 11/20/20 2144  . QUEtiapine (SEROQUEL) tablet 50 mg  50 mg Oral QHS Maryagnes AmosStarkes-Perry, Aritza Brunet S, FNP   50 mg at 11/20/20 2148    Musculoskeletal: Strength & Muscle Tone: within normal limits Gait & Station: did not witness Patient leans: N/A  Psychiatric Specialty Exam: Physical Exam Vitals and nursing note reviewed.  Constitutional:      Appearance: Normal appearance.  HENT:     Head: Normocephalic.     Nose: Nose normal.  Pulmonary:     Effort: Pulmonary effort is normal.  Neurological:     General: No focal deficit present.     Mental Status: She is alert.  Psychiatric:        Attention and Perception: She is inattentive.        Mood and Affect: Mood is anxious. Affect is angry.        Speech: Speech normal.        Behavior: Behavior is agitated.        Thought Content: Thought content normal.        Cognition and Memory: Cognition is impaired.        Judgment: Judgment is impulsive.     Review of Systems  Psychiatric/Behavioral: Positive for memory loss. The patient is nervous/anxious.   All other systems reviewed and are negative.   Blood pressure 126/70, pulse 73, temperature 97.9 F (36.6 C), temperature source Oral, resp. rate 20, weight 88 kg, SpO2 99 %.Body mass index is 31.31 kg/m.  General Appearance: Casual  Eye Contact:  None  Speech:  NA  Volume:  Unable to assess  Mood:  NA  Affect:  NA  Thought Process:  NA  Orientation:  NA  Thought Content:  NA  Suicidal Thoughts:  No  Homicidal Thoughts:  No  Memory:  NA  Judgement:  NA  Insight:  NA  Psychomotor Activity:  NA  Concentration:  Concentration: NA and Attention Span: NA  Recall:  NA  Fund of Knowledge:  NA  Language:  NA   Akathisia:  NA  Handed:  Right  AIMS (if indicated):     Assets:  Housing Leisure Time Resilience Social Support  ADL's:  Impaired  Cognition:  Impaired,  Moderate  Sleep:      Physical Exam: Physical Exam Vitals and nursing note reviewed.  Constitutional:      Appearance: Normal appearance.  HENT:     Head: Normocephalic.     Nose: Nose normal.  Pulmonary:     Effort: Pulmonary effort is normal.  Neurological:     General: No focal deficit present.     Mental Status: She is alert.  Psychiatric:        Attention and Perception: She is inattentive.        Mood and Affect: Mood is anxious. Affect is angry.        Speech: Speech normal.        Behavior: Behavior is agitated.  Thought Content: Thought content normal.        Cognition and Memory: Cognition is impaired.        Judgment: Judgment is impulsive.    Review of Systems  Psychiatric/Behavioral: Positive for memory loss. The patient is nervous/anxious.   All other systems reviewed and are negative.  Blood pressure 126/70, pulse 73, temperature 97.9 F (36.6 C), temperature source Oral, resp. rate 20, weight 88 kg, SpO2 99 %. Body mass index is 31.31 kg/m.  Treatment Plan Summary: Suspected acute encephalopathy in the setting of underlying Bipolar disorder. Patient is presenting as psychotic and manic, this could be due to recent her ongoing MRSA infection or osteomyelitis. As we know any underlying infection can disrupt mental status in our geriatric population. Her MRI also indicates possible late subacute infarct or encephalitis. In combination with neuro and medical team, will continue to manage psychiatric symptoms and attempt to achieve stabilization of her Bipolar disorder although this will be difficult as she has ongoing complex medical conditions.      Virtually any medical condition or physiologic stress can precipitate delirium in a susceptible individual, with risk increasing in those with: advanced  age, sensory impairments, organic brain disease (stroke, dementia, Parkinsons), psychiatric illness, major chronic medical issues, prolonged hospitalizations, postoperative status, anemia, insomnia/disturbed sleep, and severe pain. Addressing the underlying medical condition and institution of preventative measures are recommended.   Bipolar affective disorder, mania, mild: -Continue Depakote 500 mg BID, we will order valproic acid level for tomorrow morning and adjust accordingly. -Continue Seroquel 250 mg at bedtime, continue as needed Seroquel as needed for agitation and anxiety.  Will encourage nursing staff to document when administering/ use of as needed medication. -Continue one-to-one Recruitment consultant.  Also encouraged documentation as well in order to assess and monitor patient when psychiatry is not present. -Will order EKG to assess for QTC prolongation, will refrain and recommend limiting use of Haldol until EKG is obtained.  Anxiety: -Continue gabapentin 100 mg BID  Disposition: Supportive therapy provided about ongoing stressors.  Maryagnes Amos, FNP 11/21/2020 11:50 AM

## 2020-11-21 NOTE — Progress Notes (Signed)
Multiple attempts have been made to encourage patient to take her oral medications, tried with juice and ice cream with no success. Pt becomes very agitated and combative with attempts, swatting at tech and RN on several occasions. MD updated and made aware. Agitation covered with PRN Ativan and Haldol (see MAR for details).

## 2020-11-21 NOTE — Progress Notes (Signed)
PROGRESS NOTE                                                                                                                                                                                                             Patient Demographics:    Becky Hendricks, is a 75 y.o. female, DOB - 01-03-1946, RNH:[id number]  Outpatient Primary MD for the patient is [name], MD    LOS - 3  Admit date - 11/17/2020    Chief Complaint  Patient presents with  . ivc  . 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  So far LP, CT scan chest abdomen pelvis nonacute, MRI with questionable occipital lobe changes.   Subjective:   Patient in bed, no headache , no SOB but is ++ confused.  No focal deficits.   Assessment  & Plan :   1.  Acute toxic encephalopathy gradually progressive at SNF - MRI with ?  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  According to brother she has not been taking her psych medications for months, on high-dose Seroquel along with Haldol as needed, will wait for Psych input.  2. Sepsis in the setting of MRSA bacteremia - ID consulted, on Vancomycin for now, TTE stable, per ID no need for TEE, repeat cultures stable thus far.  3. N&V with stool ball - supportive Rx, bowel regimen. Check KUB.  4. H/O Sacral Osteomyelitis with healed Scaral Decub Ulcer - continue nursing wound care, stable ESR CRP and trend procalcitonin as well.   5.  Bipolar disorder.  Currently on combination of Depakote and Seroquel.  Psych has seen the patient, request continued follow-up.  According to the brother she stopped taking her psych medications 2 months ago, her main issue this admission seems to be largely psych disorder per neurology, will defer management to psych request Psych to follow daily.  6.  Paroxysmal A. fib.  Mali vas 2 score of greater than 3.  Currently on Eliquis, not on any rate controlling agents.  7.  Dyslipidemia.  On statin.  8.  Chronic diastolic CHF EF 62%.  Currently compensated.  9.  Obesity.  BMI of 31.  Supportive care follow-up with PCP for weight loss.  10.  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 Vanco stopped 11/20/20.   11. DM type II.  For now sliding scale.  Lab Results  Component Value Date   HGBA1C 6.1 (H) 11/18/2020   CBG (last 3)  Recent Labs    11/20/20 0606 11/20/20 2340 11/21/20 0600  GLUCAP 92 173* 121*        Condition - Fair  Family Communication  : Joni Fears [phone] over the phone on 11/20/2020 at 9:36 AM, 11/21/20  Code Status :  Full  Consults  :  Neuro  PUD Prophylaxis : PPI   Procedures  :     MRI - Ill-defined patchy  hyperintensity in the left occipital parietal lobe without restricted diffusion or abnormal enhancement. Differential diagnosis includes late subacute infarct, encephalitis.   LP - non acute  CT - 1. Main pulmonary artery measures at the upper limits of normal. Correlate with pulmonary hypertension. 2. Otherwise no acute intrathoracic abnormality. 3. Cholelithiasis with no CT findings of acute cholecystitis. 4. An 8 cm stool ball within the rectum with no associated findings suggest of stercoral colitis. 5. Grossly stable in size uterine fibroids  TTE - 1. Limited study due to poor acoustic windows. Left ventricular ejection fraction, by estimation, is 60 to 65%. The left ventricle has normal function. The left ventricle has no regional wall motion abnormalities. There is mild concentric left ventricular hypertrophy. Left ventricular diastolic parameters are consistent with Grade I diastolic dysfunction (impaired relaxation).  2. Right ventricular systolic function is mildly reduced. The right ventricular size is mildly-to-moderately enlarged.  3. The mitral valve is normal in structure. Trivial mitral valve regurgitation. No evidence of mitral stenosis.  4. The aortic valve is tricuspid. There is mild calcification of the aortic valve. There is mild thickening of the aortic valve. Aortic valve regurgitation is not visualized. Mild aortic valve sclerosis is present, with no evidence of aortic valve stenosis.  5. No valvular vegetations visualized on surface echo      Disposition Plan  :    Status is: Inpatient  Remains inpatient appropriate because:IV treatments appropriate due to intensity of illness or inability to take PO   Dispo: The patient is from: SNF              Anticipated d/c is to: SNF              Patient currently is  not medically stable to d/c.   Difficult to place patient No   DVT Prophylaxis  :  Eliquis  Lab Results  Component Value Date   PLT 233 11/21/2020    Diet :   Diet Order            Diet NPO time specified Except for: Sips with Meds  Diet effective midnight                  Inpatient Medications  Scheduled Meds: . amLODipine  10 mg Oral Daily  . apixaban  5 mg Oral BID  . atorvastatin  10 mg Oral Daily  . bisacodyl  10 mg Rectal Daily  . cloNIDine  0.2 mg Transdermal Weekly  . divalproex  500 mg Oral BID  . docusate sodium  100 mg Oral BID  . insulin aspart  0-15 Units Subcutaneous Q6H  . multivitamin with minerals  1 tablet Oral Daily  . pantoprazole  40 mg Oral Daily  . polyethylene glycol  17 g Oral BID  . QUEtiapine  200 mg Oral QHS  . QUEtiapine  50 mg Oral QHS   Continuous Infusions: . DAPTOmycin (CUBICIN)  IV Stopped (11/20/20 2217)   PRN Meds:.acetaminophen **OR** acetaminophen, haloperidol lactate, hydrALAZINE, LORazepam, [DISCONTINUED] ondansetron **OR** ondansetron (ZOFRAN) IV, QUEtiapine  Antibiotics  :    Anti-infectives (From admission, onward)   Start     Dose/Rate Route Frequency Ordered Stop   11/20/20 2000  DAPTOmycin (CUBICIN) 700 mg in sodium chloride 0.9 % IVPB        8 mg/kg  88 kg 128 mL/hr over 30 Minutes Intravenous Daily 11/20/20 1657     11/18/20 2200  vancomycin (VANCOREADY) IVPB 1250 mg/250 mL  Status:  Discontinued        1,250 mg 166.7 mL/hr over 90 Minutes Intravenous Every 24 hours 11/17/20 2345 11/20/20 1644   11/18/20 0000  ampicillin (OMNIPEN) 2 g in sodium chloride 0.9 % 100 mL IVPB  Status:  Discontinued        2 g 300 mL/hr over 20 Minutes Intravenous Every 4 hours 11/17/20 2319 11/18/20 1520   11/17/20 2345  vancomycin (VANCOCIN) IVPB 1000 mg/200 mL premix        1,000 mg 200 mL/hr over 60 Minutes Intravenous  Once 11/17/20 2331 11/18/20 0155   11/17/20 2330  cefTRIAXone (ROCEPHIN) 2 g in sodium chloride 0.9 % 100 mL IVPB  Status:  Discontinued        2 g 200 mL/hr over 30 Minutes Intravenous Every 12 hours 11/17/20 2319 11/18/20 1520       Time Spent in minutes   30   Lala Lund M.D on 11/21/2020 at 10:58 AM  To page go to [email or web address]   [facility] -  Office  [phone]    See all Orders from today for further details    Objective:   Vitals:   11/20/20 1149 11/20/20 2018 11/20/20 2344 11/21/20 0415  BP: (!) 179/80 (!) 191/101 (!) 131/59 126/70  Pulse: 96 85 85 73  Resp: _0 Temp: 98.2 F (36.8 C) 97.7 F (36.5 C)  97.9 F (36.6 C)  TempSrc: Oral Oral  Oral  SpO2: 100%  99% 99%  Weight:        Wt Readings from Last 3 Encounters:  11/20/20 88 kg  10/12/20 88.5 kg  05/06/20 92.5 kg     Intake/Output Summary (Last 24 hours) at 11/21/2020 1058 Last data filed at 11/21/2020 [phone]  Gross per 24 hour  Intake 354 ml  Output --  Net 354 ml     Physical Exam  Awake but confused, No new F.N deficits,   [place].AT,PERRAL Supple Neck,No JVD, No cervical lymphadenopathy appriciated.  Symmetrical Chest wall movement, Good air movement bilaterally, CTAB RRR,No Gallops,Rubs or new Murmurs, No Parasternal Heave +ve B.Sounds, Abd Soft, No tenderness, No organomegaly appriciated, No rebound - guarding or rigidity. No Cyanosis, Clubbing or edema, diffuse macular rash in arms and trunk    Data Review:    CBC Recent Labs  Lab 11/17/20 1600 11/18/20 0154 11/19/20 1933 11/21/20 0245  WBC 12.0* 11.8* 8.1 9.5  HGB 13.4 12.7 13.3 11.8*  HCT 42.0 39.7 41.5 36.3  PLT 248 227 242 233  MCV 89.6 89.8 90.4 88.1  MCH 28.6 28.7 29.0 28.6  MCHC 31.9 32.0 32.0 32.5  RDW 14.2 14.2 14.3 14.2  LYMPHSABS  --  3.1 2.3 2.2  MONOABS  --  1.2* 0.9 1.0  EOSABS  --  0.0 0.4 0.5  BASOSABS  --  0.0 0.0 0.0    Recent Labs  Lab 11/17/20 1600 11/17/20 1842 11/18/20 0154 11/18/20 1109 11/19/20 1933 11/20/20 0208 11/21/20 0245  NA 135  --  133*  --  139  --  141  K 3.9  --  4.1  --  4.2  --  3.6  CL 97*  --  96*  --  104  --  106  CO2 26  --  27  --  25  --  29  GLUCOSE 152*  --  155*  --  83  --  139*  BUN 17  --  18  --  19   --  20  CREATININE 0.51  --  0.69  --  0.55  --  0.76  CALCIUM 9.4  --  8.9  --  8.8*  --  9.0  AST 13*  --  13*  --  18  --  12*  ALT 15  --  13  --  12  --  13  ALKPHOS 54  --  43  --  42  --  38  BILITOT 0.9  --  0.6  --  1.2  --  0.7  ALBUMIN 3.7  --  3.3*  --  3.2*  --  2.7*  MG  --   --  1.7  --  1.9  --  1.7  CRP  --   --  1.1*  --   --  1.1* 1.2*  PROCALCITON <0.10  --  <0.10  --   --   --  <0.10  LATICACIDVEN  --  1.4 1.5 1.5  --   --   --   INR  --   --   --  1.1  --   --   --   TSH  --   --  0.771  --   --   --   --   HGBA1C  --   --  6.1*  --   --   --   --   AMMONIA  --   --  22  --   --   --   --   BNP  --   --   --   --   --   --  47.7    ------------------------------------------------------------------------------------------------------------------ No results for input(s): CHOL, HDL, LDLCALC, TRIG, CHOLHDL, LDLDIRECT in the last 72 hours.  Lab Results  Component Value  Date   HGBA1C 6.1 (H) 11/18/2020   ------------------------------------------------------------------------------------------------------------------ No results for input(s): TSH, T4TOTAL, T3FREE, THYROIDAB in the last 72 hours.  Invalid input(s): FREET3  Cardiac Enzymes No results for input(s): CKMB, TROPONINI, MYOGLOBIN in the last 168 hours.  Invalid input(s): CK ------------------------------------------------------------------------------------------------------------------    Component Value Date/Time   BNP 47.7 11/21/2020 0245     Radiology Reports CT HEAD WO CONTRAST  Result Date: 11/17/2020 CLINICAL DATA:  Altered mental status. EXAM: CT HEAD WITHOUT CONTRAST TECHNIQUE: Contiguous axial images were obtained from the base of the skull through the vertex without intravenous contrast. COMPARISON:  May 05, 2020 FINDINGS: Brain: There is mild cerebral atrophy with widening of the extra-axial spaces and ventricular dilatation. There are areas of decreased attenuation within the  white matter tracts of the supratentorial brain, consistent with microvascular disease changes. Vascular: No hyperdense vessel or unexpected calcification. Skull: Normal. Negative for fracture or focal lesion. Sinuses/Orbits: Mild bilateral ethmoid sinus mucosal thickening is seen. Other: The study is limited secondary to patient motion. IMPRESSION: 1. Generalized cerebral atrophy. 2. No acute intracranial abnormality. Electronically Signed   By: Virgina Norfolk M.D.   On: 11/17/2020 17:17   CT Chest W Contrast  Result Date: 11/17/2020 CLINICAL DATA:  Fever, altered mental status.  Nausea and vomiting. EXAM: CT CHEST, ABDOMEN, AND PELVIS WITH CONTRAST TECHNIQUE: Multidetector CT imaging of the chest, abdomen and pelvis was performed following the standard protocol during bolus administration of intravenous contrast. CONTRAST:  14m OMNIPAQUE IOHEXOL 300 MG/ML  SOLN COMPARISON:  CT abdomen pelvis 12/20/2019, CT angiography chest 12/24/2019 FINDINGS: CT CHEST FINDINGS Cardiovascular: Normal heart size. No significant pericardial effusion. The thoracic aorta is normal in caliber. Mild atherosclerotic plaque of the thoracic aorta. The main pulmonary artery measures at the upper limits of normal: 3.2 cm. Mediastinum/Nodes: No enlarged mediastinal, hilar, or axillary lymph nodes. Thyroid gland, trachea, and esophagus demonstrate no significant findings. Lungs/Pleura: Right lower lobe atelectasis with persistent elevated right hemidiaphragm. Limited evaluation for pulmonary nodule due to respiratory motion artifact. No pulmonary mass. No focal consolidation. No pleural effusion. No pneumothorax. Musculoskeletal: No chest wall abnormality. No suspicious lytic or blastic osseous lesions. No acute displaced fracture. Multilevel degenerative changes of the spine. CT ABDOMEN PELVIS FINDINGS Hepatobiliary: No focal liver abnormality. Gallstone noted within the gallbladder lumen. No gallbladder wall thickening or  pericholecystic fluid. No biliary dilatation. Pancreas: No focal lesion. Normal pancreatic contour. No surrounding inflammatory changes. No main pancreatic ductal dilatation. Spleen: Normal in size without focal abnormality. Adrenals/Urinary Tract: No adrenal nodule bilaterally. Bilateral kidneys enhance symmetrically. Exophytic hypodense 2.2 cm lesion within the right kidney likely represent simple renal cysts. Slightly increased density likely due to streak artifact at this level. No hydronephrosis. No hydroureter. The urinary bladder is unremarkable. On delayed imaging, there is no urothelial wall thickening and there are no filling defects in the opacified portions of the bilateral collecting systems or ureters. Stomach/Bowel: Stomach is within normal limits. No evidence of bowel wall thickening or dilatation. 8 cm stool ball within the rectum. The appendix not definitely identified. Vascular/Lymphatic: No abdominal aorta or iliac aneurysm. Mild atherosclerotic plaque of the aorta and its branches. No abdominal, pelvic, or inguinal lymphadenopathy. Reproductive: Difficult to measure grossly stable uterine fibroids. No adnexal lesion. Other: No intraperitoneal free fluid. No intraperitoneal free gas. No organized fluid collection. Musculoskeletal: Tiny fat containing left inguinal hernia. No suspicious lytic or blastic osseous lesions. No acute displaced fracture. Multilevel degenerative changes of the spine. IMPRESSION: 1. Main pulmonary  artery measures at the upper limits of normal. Correlate with pulmonary hypertension. 2. Otherwise no acute intrathoracic abnormality. 3. Cholelithiasis with no CT findings of acute cholecystitis. 4. An 8 cm stool ball within the rectum with no associated findings suggest of stercoral colitis. 5. 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 Electronically Signed   By: Iven Finn M.D.   On: 11/17/2020 20:07   MR BRAIN W WO CONTRAST  Result Date: 11/19/2020 CLINICAL DATA:   Mental status change.  Encephalopathy EXAM: MRI HEAD WITHOUT AND WITH CONTRAST TECHNIQUE: Multiplanar, multiecho pulse sequences of the brain and surrounding structures were obtained without and with intravenous contrast. CONTRAST:  7.54m GADAVIST GADOBUTROL 1 MMOL/ML IV SOLN COMPARISON:  CT head 11/17/2020.  MRI head 12/24/2019 FINDINGS: Brain: Patchy areas of hyperintensity in the left occipital pole extending into the left posterior parietal lobe best seen on FLAIR. This involves primarily the white matter but some areas of cortex. This area does not show restricted diffusion or susceptibility. This area was normal on the prior MRI 2021. No abnormal enhancement. Moderate atrophy. Mild white matter changes consistent with chronic microvascular ischemia. Diffusion-weighted imaging negative for acute infarct. No hemorrhage or mass lesion. Vascular: Normal arterial flow voids Skull and upper cervical spine: Negative Sinuses/Orbits: Mild mucosal edema paranasal sinuses. Negative orbit Other: None IMPRESSION: Ill-defined patchy hyperintensity in the left occipital parietal lobe without restricted diffusion or abnormal enhancement. Differential diagnosis includes late subacute infarct, encephalitis. Tumor not considered likely. Correlate with symptoms. Electronically Signed   By: CFranchot GalloM.D.   On: 11/19/2020 15:24   CT Abdomen Pelvis W Contrast  Result Date: 11/17/2020 CLINICAL DATA:  Fever, altered mental status.  Nausea and vomiting. EXAM: CT CHEST, ABDOMEN, AND PELVIS WITH CONTRAST TECHNIQUE: Multidetector CT imaging of the chest, abdomen and pelvis was performed following the standard protocol during bolus administration of intravenous contrast. CONTRAST:  1019mOMNIPAQUE IOHEXOL 300 MG/ML  SOLN COMPARISON:  CT abdomen pelvis 12/20/2019, CT angiography chest 12/24/2019 FINDINGS: CT CHEST FINDINGS Cardiovascular: Normal heart size. No significant pericardial effusion. The thoracic aorta is normal in  caliber. Mild atherosclerotic plaque of the thoracic aorta. The main pulmonary artery measures at the upper limits of normal: 3.2 cm. Mediastinum/Nodes: No enlarged mediastinal, hilar, or axillary lymph nodes. Thyroid gland, trachea, and esophagus demonstrate no significant findings. Lungs/Pleura: Right lower lobe atelectasis with persistent elevated right hemidiaphragm. Limited evaluation for pulmonary nodule due to respiratory motion artifact. No pulmonary mass. No focal consolidation. No pleural effusion. No pneumothorax. Musculoskeletal: No chest wall abnormality. No suspicious lytic or blastic osseous lesions. No acute displaced fracture. Multilevel degenerative changes of the spine. CT ABDOMEN PELVIS FINDINGS Hepatobiliary: No focal liver abnormality. Gallstone noted within the gallbladder lumen. No gallbladder wall thickening or pericholecystic fluid. No biliary dilatation. Pancreas: No focal lesion. Normal pancreatic contour. No surrounding inflammatory changes. No main pancreatic ductal dilatation. Spleen: Normal in size without focal abnormality. Adrenals/Urinary Tract: No adrenal nodule bilaterally. Bilateral kidneys enhance symmetrically. Exophytic hypodense 2.2 cm lesion within the right kidney likely represent simple renal cysts. Slightly increased density likely due to streak artifact at this level. No hydronephrosis. No hydroureter. The urinary bladder is unremarkable. On delayed imaging, there is no urothelial wall thickening and there are no filling defects in the opacified portions of the bilateral collecting systems or ureters. Stomach/Bowel: Stomach is within normal limits. No evidence of bowel wall thickening or dilatation. 8 cm stool ball within the rectum. The appendix not definitely identified. Vascular/Lymphatic: No abdominal aorta or  iliac aneurysm. Mild atherosclerotic plaque of the aorta and its branches. No abdominal, pelvic, or inguinal lymphadenopathy. Reproductive: Difficult to  measure grossly stable uterine fibroids. No adnexal lesion. Other: No intraperitoneal free fluid. No intraperitoneal free gas. No organized fluid collection. Musculoskeletal: Tiny fat containing left inguinal hernia. No suspicious lytic or blastic osseous lesions. No acute displaced fracture. Multilevel degenerative changes of the spine. IMPRESSION: 1. Main pulmonary artery measures at the upper limits of normal. Correlate with pulmonary hypertension. 2. Otherwise no acute intrathoracic abnormality. 3. Cholelithiasis with no CT findings of acute cholecystitis. 4. An 8 cm stool ball within the rectum with no associated findings suggest of stercoral colitis. 5. 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 Electronically Signed   By: Iven Finn M.D.   On: 11/17/2020 20:07   ECHOCARDIOGRAM COMPLETE  Result Date: 11/19/2020    ECHOCARDIOGRAM REPORT   Patient Name:   LEONORE FRANKSON Date of Exam: 11/19/2020 Medical Rec #:  [id number]      Height:       66.0 in Accession #:    [id number]     Weight:       195.2 lb Date of Birth:  30-Nov-1945       BSA:          1.979 m Patient Age:    24 years       BP:           145/76 mmHg Patient Gender: F              HR:           75 bpm. Exam Location:  Inpatient Procedure: 2D Echo Indications:    bacteremia  History:        Patient has prior history of Echocardiogram examinations, most                 recent 12/23/2019. CHF, Arrythmias:Atrial Fibrillation,                 Signs/Symptoms:Bacteremia; Risk Factors:Diabetes.  Sonographer:    Johny Chess Referring Phys: [id number] ATFTD LATIF [facility]  Sonographer Comments: Image acquisition challenging due to uncooperative patient and Image acquisition challenging due to patient body habitus. IMPRESSIONS  1. Limited study due to poor acoustic windows. Left ventricular ejection fraction, by estimation, is 60 to 65%. The left ventricle has normal function. The left ventricle has no regional wall motion abnormalities. There is mild  concentric left ventricular hypertrophy. Left ventricular diastolic parameters are consistent with Grade I diastolic dysfunction (impaired relaxation).  2. Right ventricular systolic function is mildly reduced. The right ventricular size is mildly-to-moderately enlarged.  3. The mitral valve is normal in structure. Trivial mitral valve regurgitation. No evidence of mitral stenosis.  4. The aortic valve is tricuspid. There is mild calcification of the aortic valve. There is mild thickening of the aortic valve. Aortic valve regurgitation is not visualized. Mild aortic valve sclerosis is present, with no evidence of aortic valve stenosis.  5. No valvular vegetations visualized on surface echo. Pending clinical suspicion for infective endocarditis, can consider TEE for further evaluation. Comparison(s): Compared to prior TTE on 32/20/25, the RV systolic function appears mildly improved. Otherwise there is no significant change. Conclusion(s)/Recommendation(s): No evidence of valvular vegetations on this transthoracic echocardiogram. Would recommend a transesophageal echocardiogram to exclude infective endocarditis if clinically indicated. FINDINGS  Left Ventricle: Limited study due to poor acoustic windows. Left ventricular ejection fraction, by estimation, is 60 to 65%. The left ventricle has normal function. The  left ventricle has no regional wall motion abnormalities. Definity contrast agent was given IV to delineate the left ventricular endocardial borders. The left ventricular internal cavity size was normal in size. There is mild concentric left ventricular hypertrophy. Left ventricular diastolic parameters are consistent with Grade I diastolic dysfunction (impaired relaxation). Right Ventricle: The right ventricular size is mildly-to-moderately enlarged. Right vetricular wall thickness was not well visualized. Right ventricular systolic function is mildly reduced. Left Atrium: Left atrial size was normal in size.  Right Atrium: Right atrial size was normal in size. Pericardium: There is no evidence of pericardial effusion. Mitral Valve: The mitral valve is normal in structure. There is mild thickening of the mitral valve leaflet(s). There is mild calcification of the mitral valve leaflet(s). Trivial mitral valve regurgitation. No evidence of mitral valve stenosis. Tricuspid Valve: The tricuspid valve is normal in structure. Tricuspid valve regurgitation is trivial. Aortic Valve: The aortic valve is tricuspid. There is mild calcification of the aortic valve. There is mild thickening of the aortic valve. Aortic valve regurgitation is not visualized. Mild aortic valve sclerosis is present, with no evidence of aortic valve stenosis. Pulmonic Valve: The pulmonic valve was not well visualized. Pulmonic valve regurgitation is not visualized. Aorta: The aortic root and ascending aorta are structurally normal, with no evidence of dilitation. IAS/Shunts: No atrial level shunt detected by color flow Doppler.  LEFT VENTRICLE PLAX 2D LVIDd:         4.26 cm LVIDs:         2.71 cm LV PW:         1.17 cm LV IVS:        1.06 cm LVOT diam:     1.80 cm LVOT Area:     2.54 cm  RIGHT VENTRICLE RV S prime:     9.90 cm/s LEFT ATRIUM         Index LA diam:    3.80 cm 1.92 cm/m   AORTA Ao Root diam: 2.90 cm Ao Asc diam:  3.40 cm  SHUNTS Systemic Diam: 1.80 cm Gwyndolyn Kaufman MD Electronically signed by Gwyndolyn Kaufman MD Signature Date/Time: 11/19/2020/5:00:20 PM    Final    DG FL GUIDED LUMBAR PUNCTURE  Result Date: 11/18/2020 CLINICAL DATA:  Encephalopathy.  Fever.  Rule out meningitis EXAM: DIAGNOSTIC LUMBAR PUNCTURE UNDER FLUOROSCOPIC GUIDANCE COMPARISON:  CT abdomen pelvis 11/17/2020 FLUOROSCOPY TIME:  Fluoroscopy Time:  0 minutes 48 seconds Radiation Exposure Index (if provided by the fluoroscopic device): Number of Acquired Spot Images: 1 PROCEDURE: Patient was sedated prior to the study. The patient was uncooperative. Informed consent  was obtained from the patient's brother by telephone prior to the procedure, including potential complications of headache, allergy, and pain. With the patient prone, the lower back was prepped with Betadine. 1% Lidocaine was used for local anesthesia. Lumbar puncture was performed at the L2-3 level using a 20 gauge needle with return of clear CSF with an opening pressure of 13 cm water. Six ml of CSF were obtained for laboratory studies. The patient tolerated the procedure well and there were no apparent complications. IMPRESSION: Successful lumbar puncture.  Clear CSF. Electronically Signed   By: Franchot Gallo M.D.   On: 11/18/2020 14:20

## 2020-11-21 NOTE — Progress Notes (Signed)
OT Cancellation Note  Patient Details Name: Becky Hendricks MRN: [id number] DOB: May 13, 1946   Cancelled Treatment:    Reason Eval/Treat Not Completed: Patient declined, no reason specified - pt supine in bed, declined engagement with OT and voices "you are not to touch me and I need to talk to another doctor". OT will follow and see as able.   Barry Brunner, OT Acute Rehabilitation Services Pager [phone] Office [phone]  Chancy Milroy 11/21/2020, 11:33 AM

## 2020-11-21 NOTE — Plan of Care (Signed)
Pt is not progressing due to ongoing refusal of care, agitation, combativeness and paranoia.   Problem: Coping: Goal: Level of anxiety will decrease Outcome: Not Progressing   Problem: Safety: Goal: Ability to remain free from injury will improve Outcome: Not Progressing   Problem: Health Behavior/Discharge Planning: Goal: Ability to manage health-related needs will improve Outcome: Not Progressing   Problem: Clinical Measurements: Goal: Diagnostic test results will improve Outcome: Not Progressing   Problem: Activity: Goal: Risk for activity intolerance will decrease Outcome: Not Progressing   Problem: Nutrition: Goal: Adequate nutrition will be maintained Outcome: Not Progressing

## 2020-11-21 NOTE — Progress Notes (Signed)
PRN Hydralazine given

## 2020-11-21 NOTE — Progress Notes (Signed)
Pt refused all morning PO medication, MD updated, new orders to give Haldol 5 mg dose, wait 30 minutes, then reattempt to give PO meds. MD will switch Valproic acid to IV form.

## 2020-11-21 NOTE — Progress Notes (Signed)
EEG complete - results pending 

## 2020-11-22 DIAGNOSIS — F209 Schizophrenia, unspecified: Secondary | ICD-10-CM

## 2020-11-22 DIAGNOSIS — F3111 Bipolar disorder, current episode manic without psychotic features, mild: Secondary | ICD-10-CM | POA: Diagnosis not present

## 2020-11-22 DIAGNOSIS — R7881 Bacteremia: Secondary | ICD-10-CM | POA: Diagnosis not present

## 2020-11-22 DIAGNOSIS — B9562 Methicillin resistant Staphylococcus aureus infection as the cause of diseases classified elsewhere: Secondary | ICD-10-CM | POA: Diagnosis not present

## 2020-11-22 LAB — COMPREHENSIVE METABOLIC PANEL
ALT: 15 U/L (ref 0–44)
AST: 11 U/L — ABNORMAL LOW (ref 15–41)
Albumin: 2.8 g/dL — ABNORMAL LOW (ref 3.5–5.0)
Alkaline Phosphatase: 41 U/L (ref 38–126)
Anion gap: 10 (ref 5–15)
BUN: 16 mg/dL (ref 8–23)
CO2: 26 mmol/L (ref 22–32)
Calcium: 9.3 mg/dL (ref 8.9–10.3)
Chloride: 106 mmol/L (ref 98–111)
Creatinine, Ser: 0.67 mg/dL (ref 0.44–1.00)
GFR, Estimated: >60 mL/min (ref >60)
Glucose, Bld: 135 mg/dL — ABNORMAL HIGH (ref 70–99)
Potassium: 3.3 mmol/L — ABNORMAL LOW (ref 3.5–5.1)
Sodium: 142 mmol/L (ref 135–145)
Total Bilirubin: 0.7 mg/dL (ref 0.3–1.2)
Total Protein: 5.7 g/dL — ABNORMAL LOW (ref 6.5–8.1)

## 2020-11-22 LAB — CSF CULTURE W GRAM STAIN: Culture: NO GROWTH

## 2020-11-22 LAB — GLUCOSE, CAPILLARY
Glucose-Capillary: 114 mg/dL — ABNORMAL HIGH (ref 70–99)
Glucose-Capillary: 133 mg/dL — ABNORMAL HIGH (ref 70–99)
Glucose-Capillary: 135 mg/dL — ABNORMAL HIGH (ref 70–99)
Glucose-Capillary: 168 mg/dL — ABNORMAL HIGH (ref 70–99)
Glucose-Capillary: 169 mg/dL — ABNORMAL HIGH (ref 70–99)
Glucose-Capillary: 93 mg/dL (ref 70–99)

## 2020-11-22 LAB — CULTURE, BLOOD (ROUTINE X 2)
Culture: NO GROWTH
Special Requests: ADEQUATE

## 2020-11-22 LAB — MAGNESIUM: Magnesium: 1.9 mg/dL (ref 1.7–2.4)

## 2020-11-22 LAB — CBC WITH DIFFERENTIAL/PLATELET
Abs Immature Granulocytes: 0.02 10*3/uL (ref 0.00–0.07)
Basophils Absolute: 0 10*3/uL (ref 0.0–0.1)
Basophils Relative: 0 %
Eosinophils Absolute: 0.6 10*3/uL — ABNORMAL HIGH (ref 0.0–0.5)
Eosinophils Relative: 7 %
HCT: 38.4 % (ref 36.0–46.0)
Hemoglobin: 12.4 g/dL (ref 12.0–15.0)
Immature Granulocytes: 0 %
Lymphocytes Relative: 35 %
Lymphs Abs: 2.9 10*3/uL (ref 0.7–4.0)
MCH: 29 pg (ref 26.0–34.0)
MCHC: 32.3 g/dL (ref 30.0–36.0)
MCV: 89.9 fL (ref 80.0–100.0)
Monocytes Absolute: 0.9 10*3/uL (ref 0.1–1.0)
Monocytes Relative: 11 %
Neutro Abs: 3.9 10*3/uL (ref 1.7–7.7)
Neutrophils Relative %: 47 %
Platelets: 238 10*3/uL (ref 150–400)
RBC: 4.27 MIL/uL (ref 3.87–5.11)
RDW: 14.8 % (ref 11.5–15.5)
WBC: 8.2 10*3/uL (ref 4.0–10.5)
nRBC: 0 % (ref 0.0–0.2)

## 2020-11-22 LAB — C-REACTIVE PROTEIN: CRP: 1.5 mg/dL — ABNORMAL HIGH (ref <1.0)

## 2020-11-22 LAB — HSV DNA BY PCR (REFERENCE LAB)
HSV 1 DNA: NEGATIVE
HSV 2 DNA: NEGATIVE

## 2020-11-22 LAB — PROCALCITONIN: Procalcitonin: <0.1 ng/mL

## 2020-11-22 LAB — BRAIN NATRIURETIC PEPTIDE: B Natriuretic Peptide: 28.1 pg/mL (ref 0.0–100.0)

## 2020-11-22 LAB — VARICELLA-ZOSTER BY PCR: Varicella-Zoster, PCR: NEGATIVE

## 2020-11-22 MED ORDER — POTASSIUM CHLORIDE 20 MEQ PO PACK
40.0000 meq | PACK | Freq: Once | ORAL | Status: AC
  Administered 2020-11-22: 40 meq via ORAL
  Filled 2020-11-22: qty 2

## 2020-11-22 MED ORDER — HALOPERIDOL LACTATE 5 MG/ML IJ SOLN
5.0000 mg | Freq: Two times a day (BID) | INTRAMUSCULAR | Status: DC
  Administered 2020-11-22 (×2): 5 mg via INTRAVENOUS
  Filled 2020-11-22 (×3): qty 1

## 2020-11-22 MED ORDER — KCL-LACTATED RINGERS-D5W 20 MEQ/L IV SOLN
INTRAVENOUS | Status: DC
  Filled 2020-11-22 (×2): qty 1000

## 2020-11-22 MED ORDER — HALOPERIDOL 5 MG PO TABS
5.0000 mg | ORAL_TABLET | Freq: Two times a day (BID) | ORAL | Status: DC
  Administered 2020-11-23: 5 mg via ORAL
  Filled 2020-11-22: qty 1

## 2020-11-22 MED ORDER — AMLODIPINE BESYLATE 5 MG PO TABS
5.0000 mg | ORAL_TABLET | Freq: Every day | ORAL | Status: DC
  Administered 2020-11-23 – 2020-11-26 (×3): 5 mg via ORAL
  Filled 2020-11-22 (×3): qty 1

## 2020-11-22 MED ORDER — LORAZEPAM 2 MG/ML IJ SOLN: 1.0000 mg | Freq: Three times a day (TID) | INTRAMUSCULAR | Status: DC | PRN

## 2020-11-22 NOTE — Plan of Care (Signed)
  Problem: Coping: Goal: Level of anxiety will decrease Outcome: Not Progressing   Problem: Safety: Goal: Ability to remain free from injury will improve Outcome: Progressing   Problem: Health Behavior/Discharge Planning: Goal: Ability to manage health-related needs will improve Outcome: Not Progressing   Problem: Clinical Measurements: Goal: Diagnostic test results will improve Outcome: Not Progressing   Problem: Activity: Goal: Risk for activity intolerance will decrease Outcome: Not Progressing   Problem: Nutrition: Goal: Adequate nutrition will be maintained Outcome: Progressing

## 2020-11-22 NOTE — Progress Notes (Signed)
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 So far LP, CT scan chest abdomen pelvis nonacute, MRI with questionable occipital lobe changes.    PT Comments    Pt remains to present with cognitive impairments at baseline.  More success today at redirecting patient to achieve desired outcome.   Pt able to move into partial standing trial with flexed hips and trunk and move from stedy to sink to perform ADLs.  Continue to recommend snf placement.     Follow Up Recommendations  SNF     Equipment Recommendations  Hospital bed    Recommendations for Other Services OT consult (Resting palm guard as fingers on digging in R hand.)     Precautions / Restrictions Precautions Precautions: Fall Precaution Comments: easily agitated, behavioral Restrictions Weight Bearing Restrictions: No Other Position/Activity Restrictions: Pt more agitated with OT but easy to redirect.    Mobility  Bed Mobility Overal bed mobility: Needs Assistance Bed Mobility: Rolling;Supine to Sit;Sit to Supine Rolling: Max assist;+2 for physical assistance;+2 for safety/equipment   Supine to sit: Max assist;+2 for physical assistance;+2 for safety/equipment Sit to supine: Total assist;+2 for physical assistance;+2 for safety/equipment   General bed mobility comments: patient requires max-total assist for bed mobility, pt able to use L UE to hold onto rails to assist at times but limited due to cognition, weakness     Transfers Overall transfer level: Needs assistance Equipment used: Ambulation equipment used (sara stedy) Transfers: Sit to/from Stand Sit to Stand: Total assist;+2 physical assistance;+2 safety/equipment;From elevated surface         General transfer comment: total assist +2 to partially stand in stedy, able to get stedy flaps down and performed ADLs in front of sink with weight bearing on feet.  Bed placed in high elevated position.  Ambulation/Gait Ambulation/Gait assistance:  (unable)               Stairs             Wheelchair Mobility    Modified Rankin (Stroke Patients Only)       Balance Overall balance assessment: Needs assistance Sitting-balance support: Bilateral upper extremity supported;No upper extremity supported Sitting balance-Leahy Scale: Poor Sitting balance - Comments: initally min assist but fatigues quickly progressing to max assist for static sitting balance Postural control: Posterior lean   Standing balance-Leahy Scale: Poor                              Cognition Arousal/Alertness: Awake/alert Behavior During Therapy: Agitated;Impulsive Overall Cognitive Status: History of cognitive impairments - at baseline                                 General Comments: hx of cognitive deficits, follows simple commands when she decides to do so; easily agitated and but PTA able to redirect as needed. Perseverates on dogs and the war.      Exercises      General Comments  Pertinent Vitals/Pain Pain Assessment: Faces Faces Pain Scale: No hurt    Home Living Family/patient expects to be discharged to:: Skilled nursing facility               Additional Comments: pt unable to report, SNF per chart    Prior Function Level of Independence: Needs assistance      Comments: unable to determin PLOF, per chart review assist from SNF staff for ADLs   PT Goals (current goals can now be found in the  care plan section) Acute Rehab PT Goals Patient Stated Goal: none stated PT Goal Formulation: Patient unable to participate in goal setting Potential to Achieve Goals: Fair Progress towards PT goals: Progressing toward goals    Frequency    Min 2X/week      PT Plan Current plan remains appropriate    Co-evaluation PT/OT/SLP Co-Evaluation/Treatment: Yes Reason for Co-Treatment: Complexity of the patient's impairments (multi-system involvement) PT goals addressed during session: Mobility/safety with mobility OT goals addressed during session: ADL's and self-care      AM-PAC PT "6 Clicks" Mobility   Outcome Measure  Help needed turning from your back to your side while in a flat bed without using bedrails?: A Lot Help needed moving from lying on your back to sitting on the side of a flat bed without using bedrails?: Total Help needed moving to and from a bed to a chair (including a wheelchair)?: Total Help needed standing up from a chair using your arms (e.g., wheelchair or bedside chair)?: Total Help needed to walk in hospital room?: Total Help needed climbing 3-5 steps with a railing? : Total 6 Click Score: 7    End of Session Equipment Utilized During Treatment: Gait belt Activity Tolerance: Treatment limited secondary to agitation Patient left: in bed;with call bell/phone within reach;with bed alarm set;with nursing/sitter in room Nurse Communication: Mobility status PT Visit Diagnosis: Muscle weakness (generalized) (M62.81);Difficulty in walking, not elsewhere classified (R26.2);Other symptoms and signs involving the nervous system (R29.898);Hemiplegia and hemiparesis Hemiplegia - dominant/non-dominant:  (unknown)     Time: 1638-4665 PT Time Calculation (min) (ACUTE ONLY): 28 min  Charges:  $Therapeutic Activity: 8-22 mins                     Bonney Leitz , PTA Acute Rehabilitation Services Pager [phone] Office [phone]     Rylee Huestis Artis Delay 11/22/2020,  2:30 PM

## 2020-11-22 NOTE — Evaluation (Signed)
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 So far LP, CT scan chest abdomen pelvis nonacute, MRI with questionable occipital lobe changes.   Clinical Impression   Patient supine in bed and agreeable to OT/PT session with encouragement.  Patient completing bed mobility with max-total assist +2, transfers with total assist +2 and ADLs with max-total assist.  She is limited by strength, cognition, balance and activity tolerance.  She follows simple commands (on her terms) with increased time (hand over hand for grooming), is highly distractable and has no awareness of the situation.  Per chart review, appears near baseline but believe she will benefit from R palm guard due to what appears to be digit 3-5 contractures--difficult to assess due to agitation. Will follow acutely.     Follow Up Recommendations  SNF;Supervision/Assistance - 24 hour    Equipment Recommendations  None recommended by OT    Recommendations for Other Services       Precautions / Restrictions Precautions Precautions: Fall Precaution Comments: easily agitated, behavioral Restrictions Weight Bearing Restrictions: No      Mobility Bed Mobility Overal bed mobility: Needs Assistance Bed Mobility: Rolling;Supine to Sit;Sit to Supine Rolling: Max assist;+2 for physical assistance;+2 for safety/equipment   Supine to sit: Max assist;+2 for physical assistance;+2 for safety/equipment Sit to supine: Total assist;+2 for  physical assistance;+2 for safety/equipment   General bed mobility comments: patient requires max-total assist for bed mobility, pt able to use L UE to hold onto rails to assist at times but limited due to cognition, weakness    Transfers Overall transfer level: Needs assistance Equipment used: Ambulation equipment used Transfers: Sit to/from Stand Sit to Stand: Total assist;+2 physical assistance;+2 safety/equipment         General transfer comment: total assist +2 to partially stand in stedy    Balance Overall balance assessment: Needs assistance Sitting-balance support: Bilateral upper extremity supported;No upper extremity supported Sitting balance-Leahy Scale: Poor Sitting balance - Comments: initally min assist but fatigues quickly progressing to max assist for static sitting balance Postural control: Posterior lean                                 ADL either performed or assessed with clinical judgement   ADL Overall ADL's : Needs assistance/impaired     Grooming: Maximal assistance;Sitting Grooming Details (indicate cue type and reason): at sink in stedy, able to complete minimal tasks with hand over hand support using L UE         Upper Body Dressing : Maximal assistance;Sitting Upper Body Dressing Details (indicate cue type and reason): to keep gown on UEs   Lower Body Dressing Details (indicate cue type and reason): refused socks Toilet Transfer: Total assistance;+2 for physical assistance;+2 for safety/equipment   Toileting- Clothing Manipulation and Hygiene: Total assistance;+2 for physical assistance;+2 for safety/equipment Toileting - Clothing Manipulation Details (indicate cue type and reason): incontinent BM, unaware             Vision  Perception     Praxis      Pertinent Vitals/Pain Pain Assessment: Faces Faces Pain Scale: No hurt     Hand Dominance     Extremity/Trunk Assessment Upper Extremity Assessment Upper  Extremity Assessment: Difficult to assess due to impaired cognition;RUE deficits/detail (appears [facility] but noted R hand contracture) RUE Deficits / Details: R hand contracture (digits 3-5) but limited assessment due to discomfort/agitation.  Will need palm guard.   Lower Extremity Assessment Lower Extremity Assessment: Defer to PT evaluation       Communication Communication Communication: No difficulties   Cognition Arousal/Alertness: Awake/alert Behavior During Therapy: Agitated;Impulsive Overall Cognitive Status: History of cognitive impairments - at baseline                                 General Comments: hx of cognitive deficits, follows simple commands when she decides to do so; easily agitated and but PT able to redirect as needed. Perseverates on dogs and the war.   General Comments       Exercises     Shoulder Instructions      Home Living Family/patient expects to be discharged to:: Skilled nursing facility                                 Additional Comments: pt unable to report, SNF per chart      Prior Functioning/Environment Level of Independence: Needs assistance        Comments: unable to determin PLOF, per chart review assist from SNF staff for ADLs        OT Problem List: Decreased strength;Impaired UE functional use;Impaired balance (sitting and/or standing);Decreased activity tolerance;Decreased cognition      OT Treatment/Interventions: Self-care/ADL training;Therapeutic activities;Cognitive remediation/compensation;Splinting;Therapeutic exercise;Patient/family education;Balance training    OT Goals(Current goals can be found in the care plan section) Acute Rehab OT Goals Patient Stated Goal: none stated Time For Goal Achievement: 12/06/20 Potential to Achieve Goals: Good  OT Frequency: Min 2X/week   Barriers to D/C:            Co-evaluation PT/OT/SLP Co-Evaluation/Treatment: Yes Reason for Co-Treatment:  Complexity of the patient's impairments (multi-system involvement);Necessary to address cognition/behavior during functional activity;For patient/therapist safety;To address functional/ADL transfers   OT goals addressed during session: ADL's and self-care      AM-PAC OT "6 Clicks" Daily Activity     Outcome Measure Help from another person eating meals?: A Lot Help from another person taking care of personal grooming?: A Lot Help from another person toileting, which includes using toliet, bedpan, or urinal?: Total Help from another person bathing (including washing, rinsing, drying)?: Total Help from another person to put on and taking off regular upper body clothing?: Total Help from another person to put on and taking off regular lower body clothing?: Total 6 Click Score: 8   End of Session Nurse Communication: Mobility status  Activity Tolerance: Treatment limited secondary to agitation Patient left: in bed;with call bell/phone within reach;with bed alarm set;with nursing/sitter in room  OT Visit Diagnosis: Other abnormalities of gait and mobility (R26.89);Muscle weakness (generalized) (M62.81)                Time: 2725-3664 OT Time Calculation (min): 26 min Charges:  OT General Charges $OT Visit: 1 Visit OT Evaluation $OT Eval Moderate Complexity: 1 Mod  Barry Brunner, OT Acute Rehabilitation Services Pager [phone]  Office [phone]   Chancy Milroy 11/22/2020, 2:16 PM

## 2020-11-22 NOTE — Progress Notes (Signed)
Subjective:  "I want food"  Antibiotics:  Anti-infectives (From admission, onward)   Start     Dose/Rate Route Frequency Ordered Stop   11/20/20 2000  DAPTOmycin (CUBICIN) 700 mg in sodium chloride 0.9 % IVPB        8 mg/kg  88 kg 128 mL/hr over 30 Minutes Intravenous Daily 11/20/20 1657     11/18/20 2200  vancomycin (VANCOREADY) IVPB 1250 mg/250 mL  Status:  Discontinued        1,250 mg 166.7 mL/hr over 90 Minutes Intravenous Every 24 hours 11/17/20 2345 11/20/20 1644   11/18/20 0000  ampicillin (OMNIPEN) 2 g in sodium chloride 0.9 % 100 mL IVPB  Status:  Discontinued        2 g 300 mL/hr over 20 Minutes Intravenous Every 4 hours 11/17/20 2319 11/18/20 1520   11/17/20 2345  vancomycin (VANCOCIN) IVPB 1000 mg/200 mL premix        1,000 mg 200 mL/hr over 60 Minutes Intravenous  Once 11/17/20 2331 11/18/20 0155   11/17/20 2330  cefTRIAXone (ROCEPHIN) 2 g in sodium chloride 0.9 % 100 mL IVPB  Status:  Discontinued        2 g 200 mL/hr over 30 Minutes Intravenous Every 12 hours 11/17/20 2319 11/18/20 1520      Medications: Scheduled Meds: . [START ON 11/23/2020] amLODipine  5 mg Oral Daily  . apixaban  5 mg Oral BID  . atorvastatin  10 mg Oral Daily  . bisacodyl  10 mg Rectal Once  . cloNIDine  0.2 mg Transdermal Weekly  . docusate sodium  200 mg Oral BID  . haloperidol  5 mg Oral BID   Or  . haloperidol lactate  5 mg Intravenous BID  . insulin aspart  0-15 Units Subcutaneous Q6H  . lactulose  30 g Oral BID  . multivitamin with minerals  1 tablet Oral Daily  . pantoprazole  40 mg Oral Daily  . polyethylene glycol  17 g Oral BID   Continuous Infusions: . DAPTOmycin (CUBICIN)  IV 700 mg (11/21/20 1955)  . dextrose 5% lactated ringers with KCl 20 mEq/L 100 mL/hr at 11/22/20 0815  . valproate sodium 500 mg (11/21/20 2135)   PRN Meds:.acetaminophen **OR** acetaminophen, haloperidol lactate, hydrALAZINE, LORazepam, [DISCONTINUED] ondansetron **OR** ondansetron  (ZOFRAN) IV, QUEtiapine    Objective: Weight change:   Intake/Output Summary (Last 24 hours) at 11/22/2020 1603 Last data filed at 11/22/2020 0423 Gross per 24 hour  Intake --  Output 600 ml  Net -600 ml   Blood pressure (!) 147/78, pulse 96, temperature 98.4 F (36.9 C), temperature source Oral, resp. rate 18, weight 88 kg, SpO2 99 %. Temp:  [97.9 F (36.6 C)-98.9 F (37.2 C)] 98.4 F (36.9 C) (04/12 1351) Pulse Rate:  [81-109] 96 (04/12 1600) Resp:  [16-22] 18 (04/12 1600) BP: (93-165)/(52-95) 147/78 (04/12 1600) SpO2:  [96 %-99 %] 99 % (04/12 1600)  Physical Exam: Physical Exam Constitutional:      General: She is not in acute distress.    Appearance: She is well-developed. She is not diaphoretic.  HENT:     Head: Normocephalic and atraumatic.     Right Ear: External ear normal.     Left Ear: External ear normal.     Mouth/Throat:     Pharynx: No oropharyngeal exudate.  Eyes:     General: No scleral icterus.    Extraocular Movements: Extraocular movements intact.     Conjunctiva/sclera: Conjunctivae normal.  Cardiovascular:     Rate and Rhythm: Normal rate and regular rhythm.  Pulmonary:     Effort: Pulmonary effort is normal. No respiratory distress.     Breath sounds: Normal breath sounds. No wheezing.  Abdominal:     General: Bowel sounds are normal. There is no distension.     Palpations: Abdomen is soft.  Musculoskeletal:        General: No tenderness. Normal range of motion.  Lymphadenopathy:     Cervical: No cervical adenopathy.  Skin:    General: Skin is warm and dry.     Coloration: Skin is not pale.     Findings: No erythema or rash.  Neurological:     Mental Status: She is alert.     Motor: No abnormal muscle tone.     Coordination: Coordination normal.  Psychiatric:        Mood and Affect: Mood is anxious. Affect is inappropriate.        Speech: Speech is tangential.        Thought Content: Thought content is paranoid and delusional.         Cognition and Memory: Cognition is impaired. Memory is impaired. She exhibits impaired recent memory.        Judgment: Judgment is impulsive and inappropriate.      CBC:    BMET Recent Labs    11/21/20 0245 11/22/20 0451  NA 141 142  K 3.6 3.3*  CL 106 106  CO2 29 26  GLUCOSE 139* 135*  BUN 20 16  CREATININE 0.76 0.67  CALCIUM 9.0 9.3     Liver Panel  Recent Labs    11/21/20 0245 11/22/20 0451  PROT 5.4* 5.7*  ALBUMIN 2.7* 2.8*  AST 12* 11*  ALT 13 15  ALKPHOS 38 41  BILITOT 0.7 0.7       Sedimentation Rate Recent Labs    11/20/20 0208  ESRSEDRATE 7   C-Reactive Protein Recent Labs    11/21/20 0245 11/22/20 0451  CRP 1.2* 1.5*    Micro Results: Recent Results (from the past 720 hour(s))  Urine culture     Status: None   Collection Time: 11/17/20  5:14 PM   Specimen: Urine, Random  Result Value Ref Range Status   Specimen Description   Final    URINE, RANDOM Performed at [facility], [address], [place], [place] [address]    Special Requests   Final    NONE Performed at [facility], [address], [place], [place] [address]    Culture   Final    NO GROWTH Performed at [facility], [address] [place], [place] [address]    Report Status 11/19/2020 FINAL  Final  Culture, blood (routine x 2)     Status: None   Collection Time: 11/17/20  6:42 PM   Specimen: BLOOD  Result Value Ref Range Status   Specimen Description   Final    BLOOD LEFT ANTECUBITAL Performed at [facility], [address], [place], [place] [address]    Special Requests   Final    BOTTLES DRAWN AEROBIC AND ANAEROBIC Blood Culture adequate volume Performed at [facility], [address] [place], [place] [address]    Culture   Final    NO GROWTH 5 DAYS Performed at [facility], [address], [place], [place] [address]    Report Status 11/22/2020 FINAL  Final   Culture,  blood (routine x 2)     Status: Abnormal   Collection Time: 11/17/20  6:45 PM   Specimen: BLOOD LEFT FOREARM  Result Value Ref Range Status   Specimen Description   Final    BLOOD LEFT FOREARM Performed at [facility], [address], [place], [place] [address]    Special Requests   Final    BOTTLES DRAWN AEROBIC AND ANAEROBIC Blood Culture adequate volume Performed at [facility], [address] [place], [place] [address]    Culture  Setup Time   Final    GRAM POSITIVE COCCI IN CLUSTERS AEROBIC BOTTLE ONLY CRITICAL RESULT CALLED TO, READ BACK BY AND VERIFIED WITH: PHARMD CHRISTINE SHADE ON 11/18/20 AT 1508 BY KJ Performed at [facility], [address], [place], [place] [address]    Culture METHICILLIN RESISTANT STAPHYLOCOCCUS AUREUS (A)  Final   Report Status 11/20/2020 FINAL  Final   Organism ID, Bacteria METHICILLIN RESISTANT STAPHYLOCOCCUS AUREUS  Final      Susceptibility   Methicillin resistant staphylococcus aureus - MIC*    CIPROFLOXACIN 2 INTERMEDIATE Intermediate     ERYTHROMYCIN >=8 RESISTANT Resistant     GENTAMICIN <=0.5 SENSITIVE Sensitive     OXACILLIN >=4 RESISTANT Resistant     TETRACYCLINE <=1 SENSITIVE Sensitive     VANCOMYCIN <=0.5 SENSITIVE Sensitive     TRIMETH/SULFA <=10 SENSITIVE Sensitive     CLINDAMYCIN <=0.25 SENSITIVE Sensitive     RIFAMPIN <=0.5 SENSITIVE Sensitive     Inducible Clindamycin NEGATIVE Sensitive     * METHICILLIN RESISTANT STAPHYLOCOCCUS AUREUS  Blood Culture ID Panel (Reflexed)     Status: Abnormal   Collection Time: 11/17/20  6:45 PM  Result Value Ref Range Status   Enterococcus faecalis NOT DETECTED NOT DETECTED Final   Enterococcus Faecium NOT DETECTED NOT DETECTED Final   Listeria monocytogenes NOT DETECTED NOT DETECTED Final   Staphylococcus species DETECTED (A) NOT DETECTED Final    Comment: CRITICAL RESULT CALLED TO, READ BACK BY AND VERIFIED WITH: PHARMD CHRISTINE  SHADE ON 11/18/20 AT 1508 BY KJ    Staphylococcus aureus (BCID) DETECTED (A) NOT DETECTED Final    Comment: Methicillin (oxacillin)-resistant Staphylococcus aureus (MRSA). MRSA is predictably resistant to beta-lactam antibiotics (except ceftaroline). Preferred therapy is vancomycin unless clinically contraindicated. Patient requires contact precautions if  hospitalized. CRITICAL RESULT CALLED TO, READ BACK BY AND VERIFIED WITH: PHARMD CHRISTINE SHADE ON 11/18/20 AT 1508 BY KJ    Staphylococcus epidermidis NOT DETECTED NOT DETECTED Final   Staphylococcus lugdunensis NOT DETECTED NOT DETECTED Final   Streptococcus species NOT DETECTED NOT DETECTED Final   Streptococcus agalactiae NOT DETECTED NOT DETECTED Final   Streptococcus pneumoniae NOT DETECTED NOT DETECTED Final   Streptococcus pyogenes NOT DETECTED NOT DETECTED Final   A.calcoaceticus-baumannii NOT DETECTED NOT DETECTED Final   Bacteroides fragilis NOT DETECTED NOT DETECTED Final   Enterobacterales NOT DETECTED NOT DETECTED Final   Enterobacter cloacae complex NOT DETECTED NOT DETECTED Final   Escherichia coli NOT DETECTED NOT DETECTED Final   Klebsiella aerogenes NOT DETECTED NOT DETECTED Final   Klebsiella oxytoca NOT DETECTED NOT DETECTED Final   Klebsiella pneumoniae NOT DETECTED NOT DETECTED Final   Proteus species NOT DETECTED NOT DETECTED Final   Salmonella species NOT DETECTED NOT DETECTED Final   Serratia marcescens NOT DETECTED NOT DETECTED Final   Haemophilus influenzae NOT DETECTED NOT DETECTED Final   Neisseria meningitidis NOT DETECTED NOT DETECTED Final   Pseudomonas aeruginosa NOT DETECTED NOT  DETECTED Final   Stenotrophomonas maltophilia NOT DETECTED NOT DETECTED Final   Candida albicans NOT DETECTED NOT DETECTED Final   Candida auris NOT DETECTED NOT DETECTED Final   Candida glabrata NOT DETECTED NOT DETECTED Final   Candida krusei NOT DETECTED NOT DETECTED Final   Candida parapsilosis NOT DETECTED NOT  DETECTED Final   Candida tropicalis NOT DETECTED NOT DETECTED Final   Cryptococcus neoformans/gattii NOT DETECTED NOT DETECTED Final   Meth resistant mecA/C and MREJ DETECTED (A) NOT DETECTED Final    Comment: CRITICAL RESULT CALLED TO, READ BACK BY AND VERIFIED WITH: PHARMD CHRISTINE SHADE ON 11/18/20 AT 1508 BY KJ Performed at [facility], [address], [place], [place] [address]   Resp Panel by RT-PCR (Flu A&B, Covid) Nasopharyngeal Swab     Status: None   Collection Time: 11/17/20  7:12 PM   Specimen: Nasopharyngeal Swab; Nasopharyngeal(NP) swabs in vial transport medium  Result Value Ref Range Status   SARS Coronavirus 2 by RT PCR NEGATIVE NEGATIVE Final    Comment: (NOTE) SARS-CoV-2 target nucleic acids are NOT DETECTED.  The SARS-CoV-2 RNA is generally detectable in upper respiratory specimens during the acute phase of infection. The lowest concentration of SARS-CoV-2 viral copies this assay can detect is 138 copies/mL. A negative result does not preclude SARS-Cov-2 infection and should not be used as the sole basis for treatment or other patient management decisions. A negative result may occur with  improper specimen collection/handling, submission of specimen other than nasopharyngeal swab, presence of viral mutation(s) within the areas targeted by this assay, and inadequate number of viral copies(<138 copies/mL). A negative result must be combined with clinical observations, patient history, and epidemiological information. The expected result is Negative.  Fact Sheet for Patients:  BloggerCourse.com  Fact Sheet for Healthcare Providers:  SeriousBroker.it  This test is no t yet approved or cleared by the [place] FDA and  has been authorized for detection and/or diagnosis of SARS-CoV-2 by FDA under an Emergency Use Authorization (EUA). This EUA will remain  in effect (meaning this test can be used) for the  duration of the COVID-19 declaration under Section 564(b)(1) of the Act, 21 U.S.C.section 360bbb-3(b)(1), unless the authorization is terminated  or revoked sooner.       Influenza A by PCR NEGATIVE NEGATIVE Final   Influenza B by PCR NEGATIVE NEGATIVE Final    Comment: (NOTE) The Xpert Xpress SARS-CoV-2/FLU/RSV plus assay is intended as an aid in the diagnosis of influenza from Nasopharyngeal swab specimens and should not be used as a sole basis for treatment. Nasal washings and aspirates are unacceptable for Xpert Xpress SARS-CoV-2/FLU/RSV testing.  Fact Sheet for Patients: BloggerCourse.com  Fact Sheet for Healthcare Providers: SeriousBroker.it  This test is not yet approved or cleared by the [place] FDA and has been authorized for detection and/or diagnosis of SARS-CoV-2 by FDA under an Emergency Use Authorization (EUA). This EUA will remain in effect (meaning this test can be used) for the duration of the COVID-19 declaration under Section 564(b)(1) of the Act, 21 U.S.C. section 360bbb-3(b)(1), unless the authorization is terminated or revoked.  Performed at [facility], [address], [place], [place] [address]   CSF culture     Status: None   Collection Time: 11/18/20  1:56 PM   Specimen: CSF; Cerebrospinal Fluid  Result Value Ref Range Status   Specimen Description   Final    CSF Performed at [facility], [address]  [place], [place] [address]    Special Requests   Final    LP Performed at [facility], [address] [place], [place] [address]    Gram Stain   Final    WBC PRESENT, PREDOMINANTLY MONONUCLEAR NO ORGANISMS SEEN CYTOSPIN SMEAR Gram Stain Report Called to,Read Back By and Verified With: H.JENKINS, RN AT 1614 ON 04.08.22 BY N.THOMPSON Performed at [facility], [address] [place], [place] [address]     Culture   Final    NO GROWTH 3 DAYS Performed at [facility], [address] [place], [place] [address]    Report Status 11/22/2020 FINAL  Final  Culture, blood (routine x 2)     Status: None (Preliminary result)   Collection Time: 11/18/20  7:57 PM   Specimen: BLOOD  Result Value Ref Range Status   Specimen Description   Final    BLOOD RIGHT ANTECUBITAL Performed at [facility], [address], [place], [place] [address]    Special Requests   Final    BOTTLES DRAWN AEROBIC ONLY Blood Culture adequate volume Performed at [facility], [address] [place], [place] [address]    Culture   Final    NO GROWTH 3 DAYS Performed at [facility], [address], [place], [place] [address]    Report Status PENDING  Incomplete  Culture, blood (routine x 2)     Status: None (Preliminary result)   Collection Time: 11/18/20  7:57 PM   Specimen: BLOOD RIGHT HAND  Result Value Ref Range Status   Specimen Description   Final    BLOOD RIGHT HAND Performed at [facility], [address] [place], [place] [address]    Special Requests   Final    BOTTLES DRAWN AEROBIC ONLY Blood Culture adequate volume Performed at [facility], [address], [place], [place] [address]    Culture   Final    NO GROWTH 3 DAYS Performed at [facility], [address] [place], [place] [address]    Report Status PENDING  Incomplete    Studies/Results: CT ANGIO HEAD W OR WO CONTRAST  Result Date: 11/21/2020 CLINICAL DATA:  Initial evaluation for neuro deficit, stroke suspected. EXAM: CT ANGIOGRAPHY HEAD AND NECK TECHNIQUE: Multidetector CT imaging of the head and neck was performed using the standard protocol during bolus administration of intravenous contrast. Multiplanar CT image reconstructions and MIPs were obtained to evaluate the vascular anatomy. Carotid stenosis measurements (when applicable) are  obtained utilizing NASCET criteria, using the distal internal carotid diameter as the denominator. CONTRAST:  92mL OMNIPAQUE IOHEXOL 350 MG/ML SOLN COMPARISON:  Prior MRI from 11/19/2020. FINDINGS: CT HEAD FINDINGS Brain: Age-related cerebral atrophy with mild chronic small vessel ischemic disease. Recently identified signal changes involving the left parieto-occipital region not visible by CT. No acute intracranial hemorrhage. No acute large vessel territory infarct. No mass lesion or midline shift. No hydrocephalus or extra-axial fluid collection. Vascular: No hyperdense vessel. Scattered vascular calcifications noted within the carotid siphons. Skull: Scalp soft tissues and calvarium within normal limits. Sinuses: Scattered mucosal thickening noted within the ethmoidal air cells. Small air-fluid level noted within the right sphenoid sinus. Paranasal sinuses are otherwise clear. No mastoid effusion. Orbits: Globes and orbital soft tissues within normal limits. Review of the MIP images confirms the above findings CTA NECK FINDINGS Aortic arch: Visualized aortic arch within normal limits for caliber with normal 3 vessel  morphology. No hemodynamically significant stenosis about the origin the great vessels. Right carotid system: Right common and internal carotid arteries widely patent without stenosis, dissection or occlusion. Left carotid system: Left common and internal carotid arteries widely patent without stenosis, dissection or occlusion. Vertebral arteries: Both vertebral arteries arise from subclavian arteries. No proximal subclavian artery stenosis. Moderate narrowing of the left V2 segment at the level of C3 related to an extrinsic compression from adjacent uncovertebral disease. Vertebral arteries otherwise widely patent without stenosis, dissection or occlusion. Skeleton: No acute osseous finding. No discrete or worrisome osseous lesions. Hyperostosis frontalis interna noted. Other neck: No other acute  soft tissue abnormality within the neck. No mass or adenopathy. Upper chest: Which lies upper chest demonstrates no acute finding. Review of the MIP images confirms the above findings CTA HEAD FINDINGS Anterior circulation: Both internal carotid arteries widely patent to the termini without stenosis. A1 segments widely patent. Normal anterior communicating artery complex. Both anterior cerebral arteries widely patent to their distal aspects without stenosis. No M1 stenosis or occlusion. Normal MCA bifurcations. Distal MCA branches well perfused and symmetric. Posterior circulation: Both V4 segments patent to the vertebrobasilar junction without stenosis. Neither PICA origin well visualized. Basilar widely patent to its distal aspect without stenosis. Superior cerebellar arteries patent bilaterally. Both PCAs primarily supplied via the basilar and are well perfused to there distal aspects. Venous sinuses: Patent allowing for timing contrast bolus. Anatomic variants: None significant.  No intracranial aneurysm. Review of the MIP images confirms the above findings IMPRESSION: CT HEAD IMPRESSION: 1. No acute intracranial abnormality. Recently identified signal changes at the left parieto-occipital region not visible by CT. 2. Age-related cerebral atrophy with mild chronic small vessel ischemic disease. CTA HEAD AND NECK IMPRESSION: Negative CTA of the head and neck. No large vessel occlusion, hemodynamically significant stenosis, or other acute vascular abnormality. Electronically Signed   By: Rise Mu M.D.   On: 11/21/2020 23:22   DG Abd 1 View  Result Date: 11/21/2020 CLINICAL DATA:  Nausea EXAM: ABDOMEN - 1 VIEW COMPARISON:  None. FINDINGS: There is diffuse stool throughout colon. No bowel dilatation or air-fluid level to suggest bowel obstruction. No free air. There are pelvic calcifications consistent with phleboliths. Visualized lung bases clear. IMPRESSION: Diffuse stool throughout colon. Question  a degree of underlying constipation. No bowel obstruction or free air. Electronically Signed   By: Bretta Bang III M.D.   On: 11/21/2020 12:18   CT ANGIO NECK W OR WO CONTRAST  Result Date: 11/21/2020 CLINICAL DATA:  Initial evaluation for neuro deficit, stroke suspected. EXAM: CT ANGIOGRAPHY HEAD AND NECK TECHNIQUE: Multidetector CT imaging of the head and neck was performed using the standard protocol during bolus administration of intravenous contrast. Multiplanar CT image reconstructions and MIPs were obtained to evaluate the vascular anatomy. Carotid stenosis measurements (when applicable) are obtained utilizing NASCET criteria, using the distal internal carotid diameter as the denominator. CONTRAST:  75mL OMNIPAQUE IOHEXOL 350 MG/ML SOLN COMPARISON:  Prior MRI from 11/19/2020. FINDINGS: CT HEAD FINDINGS Brain: Age-related cerebral atrophy with mild chronic small vessel ischemic disease. Recently identified signal changes involving the left parieto-occipital region not visible by CT. No acute intracranial hemorrhage. No acute large vessel territory infarct. No mass lesion or midline shift. No hydrocephalus or extra-axial fluid collection. Vascular: No hyperdense vessel. Scattered vascular calcifications noted within the carotid siphons. Skull: Scalp soft tissues and calvarium within normal limits. Sinuses: Scattered mucosal thickening noted within the ethmoidal air cells. Small air-fluid level noted within  the right sphenoid sinus. Paranasal sinuses are otherwise clear. No mastoid effusion. Orbits: Globes and orbital soft tissues within normal limits. Review of the MIP images confirms the above findings CTA NECK FINDINGS Aortic arch: Visualized aortic arch within normal limits for caliber with normal 3 vessel morphology. No hemodynamically significant stenosis about the origin the great vessels. Right carotid system: Right common and internal carotid arteries widely patent without stenosis, dissection  or occlusion. Left carotid system: Left common and internal carotid arteries widely patent without stenosis, dissection or occlusion. Vertebral arteries: Both vertebral arteries arise from subclavian arteries. No proximal subclavian artery stenosis. Moderate narrowing of the left V2 segment at the level of C3 related to an extrinsic compression from adjacent uncovertebral disease. Vertebral arteries otherwise widely patent without stenosis, dissection or occlusion. Skeleton: No acute osseous finding. No discrete or worrisome osseous lesions. Hyperostosis frontalis interna noted. Other neck: No other acute soft tissue abnormality within the neck. No mass or adenopathy. Upper chest: Which lies upper chest demonstrates no acute finding. Review of the MIP images confirms the above findings CTA HEAD FINDINGS Anterior circulation: Both internal carotid arteries widely patent to the termini without stenosis. A1 segments widely patent. Normal anterior communicating artery complex. Both anterior cerebral arteries widely patent to their distal aspects without stenosis. No M1 stenosis or occlusion. Normal MCA bifurcations. Distal MCA branches well perfused and symmetric. Posterior circulation: Both V4 segments patent to the vertebrobasilar junction without stenosis. Neither PICA origin well visualized. Basilar widely patent to its distal aspect without stenosis. Superior cerebellar arteries patent bilaterally. Both PCAs primarily supplied via the basilar and are well perfused to there distal aspects. Venous sinuses: Patent allowing for timing contrast bolus. Anatomic variants: None significant.  No intracranial aneurysm. Review of the MIP images confirms the above findings IMPRESSION: CT HEAD IMPRESSION: 1. No acute intracranial abnormality. Recently identified signal changes at the left parieto-occipital region not visible by CT. 2. Age-related cerebral atrophy with mild chronic small vessel ischemic disease. CTA HEAD AND  NECK IMPRESSION: Negative CTA of the head and neck. No large vessel occlusion, hemodynamically significant stenosis, or other acute vascular abnormality. Electronically Signed   By: Rise Mu M.D.   On: 11/21/2020 23:22   EEG adult  Result Date: 11/21/2020 Plancher, Genia Del, MD     11/21/2020  3:50 PM PROCEDURE: EEG routine 22 minutes with video study  DATES OF TEST: 11/21/2020  REASON FOR TEST: acute encephalopathy  AED/Sedative MEDICATIONS: n/a  TECHNIQUE: This is an 18 channel digital EEG recording using the standard international 10/20 system of electrode placement with one channel EKG recording. Pt was awake during this recording.  FINDINGS: Background rhythm: symmetric diffuse 4-8 Hz, mostly myogenic artifact. Amplitude :  low Continuity: continuous Breach effect:  NO Variability: YES Reactivity: YES Rhythmic delta activity: NO Periodic discharges: NO Sporadic epileptiform discharges: NO Electrographic/electroclinical seizures: NO  Limited EKG reveals no abnormalities.   IMPRESSION: This is an abnormal study due to - 1. Moderate to mild Generalized slowing is a non-specific finding consistent with a generalized disturbance of cerebral functioning including toxic, metabolic, or structural abnormalities that are multi-focal or diffuse. 2. No definite epileptiform discharges or electrographic seizures noted.       Assessment/Plan:  INTERVAL HISTORY:   Agitation continues  Principal Problem:   Bipolar affective disorder, currently manic, mild (HCC) Active Problems:   Acute metabolic encephalopathy   Severe sepsis (HCC)   AF (paroxysmal atrial fibrillation) (HCC)   Sacral wound   Chronic diastolic  CHF (congestive heart failure) (HCC)   Bipolar disorder (HCC)   SIRS (systemic inflammatory response syndrome) (HCC)   Nausea and vomiting   Mixed diabetic hyperlipidemia associated with type 2 diabetes mellitus (HCC)   Type 2 diabetes mellitus with hyperglycemia, with long-term current  use of insulin (HCC)   Constipation   Encephalopathy acute   MRSA bacteremia    Theda SersJanina Rathbun is a 75 y.o. 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   can be considered as a "complicated "  Repeat blood cultures have been taken and no growth  2D echocardiogram does not show vegetations  Given concerns about "red man" syndrome we changed to daptomycin  MRI showed a possible subacute infarct  I would not get a TEE but simply treat her for 4 weeks of systemic antibiotics  Schizophrenia/bipolar complicating interview of patient.     LOS: 4 days   Acey LavCornelius Van Dam 11/22/2020, 4:03 PM

## 2020-11-22 NOTE — Consult Note (Signed)
[facility] Face-to-Face Psychiatry Consult   Reason for Consult: Severe bipolar disorder with psychosis Referring Physician:  Dr Thedore Mins Patient Identification: Becky Hendricks MRN:  [id number] Principal Diagnosis: Bipolar affective disorder, currently manic, mild (HCC) Diagnosis:  Principal Problem:   Bipolar affective disorder, currently manic, mild (HCC) Active Problems:   Acute metabolic encephalopathy   Severe sepsis (HCC)   AF (paroxysmal atrial fibrillation) (HCC)   Sacral wound   Chronic diastolic CHF (congestive heart failure) (HCC)   Bipolar disorder (HCC)   SIRS (systemic inflammatory response syndrome) (HCC)   Nausea and vomiting   Mixed diabetic hyperlipidemia associated with type 2 diabetes mellitus (HCC)   Type 2 diabetes mellitus with hyperglycemia, with long-term current use of insulin (HCC)   Constipation   Encephalopathy acute   MRSA bacteremia   Total Time spent with patient: 20 minutes  Subjective:   Becky Hendricks is a 75 y.o. female patient admitted with AMS and agitation, history of bipolar disorder. Patient is seen and chart reviewed with Dr. Lucianne Muss. She is observed to be sitting up right, arguing with nursing staff. She is pleading to have her soft mitten restraint removed "  Please take this glove off of me."  She appears to have some improved mentation on today's evaluation, although she continues to be confused. She believes she is in "[place], and was brought here by her brother 2 years ago. " This has improved as she originally thought she was in a concentration camp in [place]. She is able to identify the remote and that it is used to operate the TV. While she continues to refuse her medications she does agree throughout the evaluation to take them. She then stated "the psychiatrist said I didn't need them." Patient continues to use a lot of explicits and is very vulgar, yet she is using them appropriately which indicates she is improving some.     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  Patient is unable to provide a history at this time due to being minimally responsive.   Past Psychiatric History: bipolar  Risk to Self:  none Risk to Others:  none Prior Inpatient Therapy:  unknown Prior Outpatient Therapy:  unknown  Past Medical History:  Past Medical History:  Diagnosis Date  . Atrial fibrillation (HCC)   . Bipolar disorder (HCC)   . Bipolar disorder (HCC) 04/27/2020  . Clostridium difficile diarrhea 08/02/2019  . Dehydration   . Essential hypertension   . Fall 02/11/2020  . Morbid obesity (HCC)   . Osteomyelitis (HCC) 12/03/2019  . Pneumonia due to COVID-19 virus 09/15/2019  . Type 2 diabetes mellitus (HCC)   . Weakness     Past Surgical History:  Procedure Laterality Date  . INCISION AND DRAINAGE PERIRECTAL ABSCESS Left 12/04/2019   Procedure: IRRIGATION AND DEBRIDEMENT LEFT BUTTOCK ABSCESS;  Surgeon: Violeta Gelinas, MD;  Location: [facility] OR;  Service: General;  Laterality: Left;  . INCISION AND DRAINAGE PERIRECTAL ABSCESS Left 12/10/2019   Procedure: REPEAT IRRIGATION AND DEBRIDEMENT BUTTOCK  ABSCESS;  Surgeon: Abigail Miyamoto, MD;  Location: [facility] OR;  Service: General;  Laterality: Left;   Family History:  Family History  Family history  unknown: Yes   Family Psychiatric  History: unknown Social History:  Social History   Substance and Sexual Activity  Alcohol Use Not Currently     Social History   Substance and Sexual Activity  Drug Use Not Currently    Social History    Socioeconomic History  . Marital status: Single    Spouse name: Not on file  . Number of children: Not on file  . Years of education: Not on file  . Highest education level: Not on file  Occupational History  . Not on file  Tobacco Use  . Smoking status: Never Smoker  . Smokeless tobacco: Never Used  Vaping Use  . Vaping Use: Unknown  Substance and Sexual Activity  . Alcohol use: Not Currently  . Drug use: Not Currently  . Sexual activity: Not Currently    Birth control/protection: Post-menopausal  Other Topics Concern  . Not on file  Social History Narrative  . Not on file   Social Determinants of Health   Financial Resource Strain: Not on file  Food Insecurity: Not on file  Transportation Needs: Not on file  Physical Activity: Not on file  Stress: Not on file  Social Connections: Not on file   Additional Social History:    Allergies:   Allergies  Allergen Reactions  . Chlorhexidine Gluconate Itching  . Acetazolamide Er Rash    Labs:  Results for orders placed or performed during the hospital encounter of 11/17/20 (from the past 48 hour(s))  Glucose, capillary     Status: Abnormal   Collection Time: 11/20/20 11:40 PM  Result Value Ref Range   Glucose-Capillary 173 (H) 70 - 99 mg/dL    Comment: Glucose reference range applies only to samples taken after fasting for at least 8 hours.   Comment 1 Notify RN    Comment 2 Document in Chart   Magnesium     Status: None   Collection Time: 11/21/20  2:45 AM  Result Value Ref Range   Magnesium 1.7 1.7 - 2.4 mg/dL    Comment: Performed at [facility], [address], [place], [place] [address]  Comprehensive metabolic panel     Status: Abnormal   Collection Time: 11/21/20  2:45 AM  Result Value Ref Range   Sodium 141 135 - 145 mmol/L   Potassium 3.6 3.5 - 5.1 mmol/L   Chloride 106 98 - 111 mmol/L   CO2 29 22 - 32 mmol/L   Glucose, Bld 139 (H) 70 - 99 mg/dL    Comment: Glucose reference range applies  only to samples taken after fasting for at least 8 hours.   BUN 20 8 - 23 mg/dL   Creatinine, Ser 0.98 0.44 - 1.00 mg/dL   Calcium 9.0 8.9 - 11.9 mg/dL   Total Protein 5.4 (L) 6.5 - 8.1 g/dL   Albumin 2.7 (L) 3.5 - 5.0 g/dL   AST 12 (L) 15 - 41 U/L   ALT 13 0 - 44 U/L   Alkaline Phosphatase 38 38 - 126 U/L   Total Bilirubin 0.7 0.3 - 1.2 mg/dL   GFR, Estimated >14 >78 mL/min    Comment: (NOTE) Calculated using the CKD-EPI Creatinine Equation (2021)    Anion gap 6 5 - 15    Comment: Performed at [facility], [address], [place], [place] [address]  Brain natriuretic peptide     Status: None   Collection Time: 11/21/20  2:45 AM  Result Value Ref Range  B Natriuretic Peptide 47.7 0.0 - 100.0 pg/mL    Comment: Performed at [facility], [address] [place], [place] [address]  CBC with Differential/Platelet     Status: Abnormal   Collection Time: 11/21/20  2:45 AM  Result Value Ref Range   WBC 9.5 4.0 - 10.5 K/uL   RBC 4.12 3.87 - 5.11 MIL/uL   Hemoglobin 11.8 (L) 12.0 - 15.0 g/dL   HCT 60.4 54.0 - 98.1 %   MCV 88.1 80.0 - 100.0 fL   MCH 28.6 26.0 - 34.0 pg   MCHC 32.5 30.0 - 36.0 g/dL   RDW 19.1 47.8 - 29.5 %   Platelets 233 150 - 400 K/uL   nRBC 0.0 0.0 - 0.2 %   Neutrophils Relative % 60 %   Neutro Abs 5.7 1.7 - 7.7 K/uL   Lymphocytes Relative 24 %   Lymphs Abs 2.2 0.7 - 4.0 K/uL   Monocytes Relative 11 %   Monocytes Absolute 1.0 0.1 - 1.0 K/uL   Eosinophils Relative 5 %   Eosinophils Absolute 0.5 0.0 - 0.5 K/uL   Basophils Relative 0 %   Basophils Absolute 0.0 0.0 - 0.1 K/uL   Immature Granulocytes 0 %   Abs Immature Granulocytes 0.03 0.00 - 0.07 K/uL    Comment: Performed at [facility], [address] [place], [place] [address]  C-reactive protein     Status: Abnormal   Collection Time: 11/21/20  2:45 AM  Result Value Ref Range   CRP 1.2 (H) <1.0 mg/dL    Comment: Performed at [facility], [address] [place], [place]  [address]  Procalcitonin     Status: None   Collection Time: 11/21/20  2:45 AM  Result Value Ref Range   Procalcitonin <0.10 ng/mL    Comment:        Interpretation: PCT (Procalcitonin) <= 0.5 ng/mL: Systemic infection (sepsis) is not likely. Local bacterial infection is possible. (NOTE)       Sepsis PCT Algorithm           Lower Respiratory Tract                                      Infection PCT Algorithm    ----------------------------     ----------------------------         PCT < 0.25 ng/mL                PCT < 0.10 ng/mL          Strongly encourage             Strongly discourage   discontinuation of antibiotics    initiation of antibiotics    ----------------------------     -----------------------------       PCT 0.25 - 0.50 ng/mL            PCT 0.10 - 0.25 ng/mL               OR       >80% decrease in PCT            Discourage initiation of                                            antibiotics  Encourage discontinuation           of antibiotics    ----------------------------     -----------------------------         PCT >= 0.50 ng/mL              PCT 0.26 - 0.50 ng/mL               AND        <80% decrease in PCT             Encourage initiation of                                             antibiotics       Encourage continuation           of antibiotics    ----------------------------     -----------------------------        PCT >= 0.50 ng/mL                  PCT > 0.50 ng/mL               AND         increase in PCT                  Strongly encourage                                      initiation of antibiotics    Strongly encourage escalation           of antibiotics                                     -----------------------------                                           PCT <= 0.25 ng/mL                                                 OR                                        > 80% decrease in PCT                                      Discontinue / Do  not initiate                                             antibiotics  Performed at [facility], [address], [place], [place] [address]   CK     Status: Abnormal   Collection Time: 11/21/20  2:45 AM  Result  Value Ref Range   Total CK 20 (L) 38 - 234 U/L    Comment: Performed at [facility], [address] [place], [place] [address]  Glucose, capillary     Status: Abnormal   Collection Time: 11/21/20  6:00 AM  Result Value Ref Range   Glucose-Capillary 121 (H) 70 - 99 mg/dL    Comment: Glucose reference range applies only to samples taken after fasting for at least 8 hours.   Comment 1 Notify RN    Comment 2 Document in Chart   Glucose, capillary     Status: Abnormal   Collection Time: 11/21/20 12:24 PM  Result Value Ref Range   Glucose-Capillary 109 (H) 70 - 99 mg/dL    Comment: Glucose reference range applies only to samples taken after fasting for at least 8 hours.  Glucose, capillary     Status: Abnormal   Collection Time: 11/21/20  6:48 PM  Result Value Ref Range   Glucose-Capillary 130 (H) 70 - 99 mg/dL    Comment: Glucose reference range applies only to samples taken after fasting for at least 8 hours.   Comment 1 Notify RN   Glucose, capillary     Status: Abnormal   Collection Time: 11/22/20 12:18 AM  Result Value Ref Range   Glucose-Capillary 133 (H) 70 - 99 mg/dL    Comment: Glucose reference range applies only to samples taken after fasting for at least 8 hours.   Comment 1 Notify RN    Comment 2 Document in Chart   Magnesium     Status: None   Collection Time: 11/22/20  4:51 AM  Result Value Ref Range   Magnesium 1.9 1.7 - 2.4 mg/dL    Comment: Performed at [facility], [address] [place], [place] [address]  Comprehensive metabolic panel     Status: Abnormal   Collection Time: 11/22/20  4:51 AM  Result Value Ref Range   Sodium 142 135 - 145 mmol/L   Potassium 3.3 (L) 3.5 - 5.1 mmol/L   Chloride 106 98 - 111 mmol/L   CO2 26 22 -  32 mmol/L   Glucose, Bld 135 (H) 70 - 99 mg/dL    Comment: Glucose reference range applies only to samples taken after fasting for at least 8 hours.   BUN 16 8 - 23 mg/dL   Creatinine, Ser 4.78 0.44 - 1.00 mg/dL   Calcium 9.3 8.9 - 29.5 mg/dL   Total Protein 5.7 (L) 6.5 - 8.1 g/dL   Albumin 2.8 (L) 3.5 - 5.0 g/dL   AST 11 (L) 15 - 41 U/L   ALT 15 0 - 44 U/L   Alkaline Phosphatase 41 38 - 126 U/L   Total Bilirubin 0.7 0.3 - 1.2 mg/dL   GFR, Estimated >62 >13 mL/min    Comment: (NOTE) Calculated using the CKD-EPI Creatinine Equation (2021)    Anion gap 10 5 - 15    Comment: Performed at [facility], [address], [place], [place] [address]  Brain natriuretic peptide     Status: None   Collection Time: 11/22/20  4:51 AM  Result Value Ref Range   B Natriuretic Peptide 28.1 0.0 - 100.0 pg/mL    Comment: Performed at [facility], [address], [place], [place] [address]  CBC with Differential/Platelet     Status: Abnormal   Collection Time: 11/22/20  4:51 AM  Result Value Ref Range   WBC 8.2 4.0 -  10.5 K/uL   RBC 4.27 3.87 - 5.11 MIL/uL   Hemoglobin 12.4 12.0 - 15.0 g/dL   HCT 82.938.4 56.236.0 - 13.046.0 %   MCV 89.9 80.0 - 100.0 fL   MCH 29.0 26.0 - 34.0 pg   MCHC 32.3 30.0 - 36.0 g/dL   RDW 86.514.8 78.411.5 - 69.615.5 %   Platelets 238 150 - 400 K/uL   nRBC 0.0 0.0 - 0.2 %   Neutrophils Relative % 47 %   Neutro Abs 3.9 1.7 - 7.7 K/uL   Lymphocytes Relative 35 %   Lymphs Abs 2.9 0.7 - 4.0 K/uL   Monocytes Relative 11 %   Monocytes Absolute 0.9 0.1 - 1.0 K/uL   Eosinophils Relative 7 %   Eosinophils Absolute 0.6 (H) 0.0 - 0.5 K/uL   Basophils Relative 0 %   Basophils Absolute 0.0 0.0 - 0.1 K/uL   Immature Granulocytes 0 %   Abs Immature Granulocytes 0.02 0.00 - 0.07 K/uL    Comment: Performed at [facility], [address] [place], [place] [address]  C-reactive protein     Status: Abnormal   Collection Time: 11/22/20  4:51 AM  Result Value Ref Range   CRP 1.5 (H)  <1.0 mg/dL    Comment: Performed at [facility], [address] [place], [place] [address]  Procalcitonin     Status: None   Collection Time: 11/22/20  4:51 AM  Result Value Ref Range   Procalcitonin <0.10 ng/mL    Comment:        Interpretation: PCT (Procalcitonin) <= 0.5 ng/mL: Systemic infection (sepsis) is not likely. Local bacterial infection is possible. (NOTE)       Sepsis PCT Algorithm           Lower Respiratory Tract                                      Infection PCT Algorithm    ----------------------------     ----------------------------         PCT < 0.25 ng/mL                PCT < 0.10 ng/mL          Strongly encourage             Strongly discourage   discontinuation of antibiotics    initiation of antibiotics    ----------------------------     -----------------------------       PCT 0.25 - 0.50 ng/mL            PCT 0.10 - 0.25 ng/mL               OR       >80% decrease in PCT            Discourage initiation of                                            antibiotics      Encourage discontinuation           of antibiotics    ----------------------------     -----------------------------         PCT >= 0.50 ng/mL              PCT 0.26 - 0.50  ng/mL               AND        <80% decrease in PCT             Encourage initiation of                                             antibiotics       Encourage continuation           of antibiotics    ----------------------------     -----------------------------        PCT >= 0.50 ng/mL                  PCT > 0.50 ng/mL               AND         increase in PCT                  Strongly encourage                                      initiation of antibiotics    Strongly encourage escalation           of antibiotics                                     -----------------------------                                           PCT <= 0.25 ng/mL                                                 OR                                         > 80% decrease in PCT                                      Discontinue / Do not initiate                                             antibiotics  Performed at [facility], [address], [place], [place] [address]   Glucose, capillary     Status: Abnormal   Collection Time: 11/22/20  6:04 AM  Result Value Ref Range   Glucose-Capillary 135 (H) 70 - 99 mg/dL    Comment: Glucose reference range applies only to samples taken after fasting for at least 8 hours.   Comment 1 Notify RN    Comment 2 Document in Chart   Glucose, capillary  Status: Abnormal   Collection Time: 11/22/20  8:27 AM  Result Value Ref Range   Glucose-Capillary 114 (H) 70 - 99 mg/dL    Comment: Glucose reference range applies only to samples taken after fasting for at least 8 hours.  Glucose, capillary     Status: Abnormal   Collection Time: 11/22/20 11:33 AM  Result Value Ref Range   Glucose-Capillary 169 (H) 70 - 99 mg/dL    Comment: Glucose reference range applies only to samples taken after fasting for at least 8 hours.    Current Facility-Administered Medications  Medication Dose Route Frequency Provider Last Rate Last Admin  . acetaminophen (TYLENOL) tablet 650 mg  650 mg Oral Q6H PRN Shalhoub, Deno Lunger, MD       Or  . acetaminophen (TYLENOL) suppository 650 mg  650 mg Rectal Q6H PRN Marinda Elk, MD      . Melene Muller ON 11/23/2020] amLODipine (NORVASC) tablet 5 mg  5 mg Oral Daily Leroy Sea, MD      . apixaban Everlene Balls) tablet 5 mg  5 mg Oral BID Sheikh, Omair Isola, DO   5 mg at 11/22/20 1120  . atorvastatin (LIPITOR) tablet 10 mg  10 mg Oral Daily Leroy Sea, MD   10 mg at 11/22/20 1119  . bisacodyl (DULCOLAX) suppository 10 mg  10 mg Rectal Once Leroy Sea, MD      . cloNIDine (CATAPRES - Dosed in mg/24 hr) patch 0.2 mg  0.2 mg Transdermal Weekly Leroy Sea, MD   0.2 mg at 11/20/20 1810  . DAPTOmycin (CUBICIN) 700 mg in sodium chloride 0.9 % IVPB  8  mg/kg Intravenous Q2000 Mosetta Anis, RPH 128 mL/hr at 11/21/20 1955 700 mg at 11/21/20 1955  . dextrose 5% in lactated ringers with KCl 20 mEq/L infusion   Intravenous Continuous Leroy Sea, MD 100 mL/hr at 11/22/20 0815 New Bag at 11/22/20 0815  . docusate sodium (COLACE) capsule 200 mg  200 mg Oral BID Susa Raring K, MD      . haloperidol lactate (HALDOL) injection 5 mg  5 mg Intravenous Q12H PRN Maryagnes Amos, FNP   5 mg at 11/21/20 1314  . hydrALAZINE (APRESOLINE) injection 10 mg  10 mg Intravenous Q6H PRN Leroy Sea, MD   10 mg at 11/21/20 1220  . insulin aspart (novoLOG) injection 0-15 Units  0-15 Units Subcutaneous Q6H Shalhoub, Deno Lunger, MD   1 Units at 11/21/20 1856  . lactulose (CHRONULAC) 10 GM/15ML solution 30 g  30 g Oral BID Leroy Sea, MD   30 g at 11/22/20 1123  . LORazepam (ATIVAN) injection 1 mg  1 mg Intravenous Q8H PRN Leroy Sea, MD      . multivitamin with minerals tablet 1 tablet  1 tablet Oral Daily Leroy Sea, MD   1 tablet at 11/22/20 1119  . ondansetron (ZOFRAN) injection 4 mg  4 mg Intravenous Q6H PRN Shalhoub, Deno Lunger, MD      . pantoprazole (PROTONIX) EC tablet 40 mg  40 mg Oral Daily Leroy Sea, MD   40 mg at 11/22/20 1120  . polyethylene glycol (MIRALAX / GLYCOLAX) packet 17 g  17 g Oral BID Leroy Sea, MD   17 g at 11/22/20 1118  . QUEtiapine (SEROQUEL) tablet 12.5 mg  12.5 mg Oral BID PRN Charm Rings, NP   12.5 mg at 11/22/20 1044  . QUEtiapine (SEROQUEL) tablet 200 mg  200  mg Oral QHS Maryagnes Amos, FNP   200 mg at 11/21/20 2132  . QUEtiapine (SEROQUEL) tablet 50 mg  50 mg Oral QHS Maryagnes Amos, FNP   50 mg at 11/21/20 2133  . valproate (DEPACON) 500 mg in dextrose 5 % 50 mL IVPB  500 mg Intravenous Q12H Leroy Sea, MD 55 mL/hr at 11/21/20 2135 500 mg at 11/21/20 2135    Musculoskeletal: Strength & Muscle Tone: within normal limits Gait & Station: did not  witness Patient leans: N/A  Psychiatric Specialty Exam: Physical Exam Vitals and nursing note reviewed.  Constitutional:      Appearance: Normal appearance.  HENT:     Head: Normocephalic.     Nose: Nose normal.  Pulmonary:     Effort: Pulmonary effort is normal.  Neurological:     General: No focal deficit present.     Mental Status: She is alert.  Psychiatric:        Attention and Perception: She is inattentive.        Mood and Affect: Mood is anxious. Affect is angry.        Speech: Speech normal.        Behavior: Behavior is agitated.        Thought Content: Thought content normal.        Cognition and Memory: Cognition is impaired.        Judgment: Judgment is impulsive.     Review of Systems  Psychiatric/Behavioral: Positive for memory loss. The patient is nervous/anxious.   All other systems reviewed and are negative.   Blood pressure (!) 155/71, pulse (!) 105, temperature 98.3 F (36.8 C), temperature source Oral, resp. rate 18, weight 88 kg, SpO2 98 %.Body mass index is 31.31 kg/m.  General Appearance: Casual  Eye Contact:  Fair  Speech:  Clear and Coherent and Slurred  Volume:  Normal  Mood:  Angry and Irritable  Affect:  Labile  Thought Process:  NA  Orientation:  NA  Thought Content:  NA  Suicidal Thoughts:  No  Homicidal Thoughts:  No  Memory:  NA  Judgement:  NA  Insight:  NA  Psychomotor Activity:  NA  Concentration:  Concentration: NA and Attention Span: NA  Recall:  NA  Fund of Knowledge:  NA  Language:  NA  Akathisia:  NA  Handed:  Right  AIMS (if indicated):     Assets:  Housing Leisure Time Resilience Social Support  ADL's:  Impaired  Cognition:  Impaired,  Moderate  Sleep:      Physical Exam: Physical Exam Vitals and nursing note reviewed.  Constitutional:      Appearance: Normal appearance.  HENT:     Head: Normocephalic.     Nose: Nose normal.  Pulmonary:     Effort: Pulmonary effort is normal.  Neurological:      General: No focal deficit present.     Mental Status: She is alert.  Psychiatric:        Attention and Perception: She is inattentive.        Mood and Affect: Mood is anxious. Affect is angry.        Speech: Speech normal.        Behavior: Behavior is agitated.        Thought Content: Thought content normal.        Cognition and Memory: Cognition is impaired.        Judgment: Judgment is impulsive.    Review of Systems  Psychiatric/Behavioral:  Positive for memory loss. The patient is nervous/anxious.   All other systems reviewed and are negative.  Blood pressure (!) 155/71, pulse (!) 105, temperature 98.3 F (36.8 C), temperature source Oral, resp. rate 18, weight 88 kg, SpO2 98 %. Body mass index is 31.31 kg/m.  Treatment Plan Summary: Suspected acute encephalopathy in the setting of underlying Bipolar disorder. Patient is presenting as psychotic and manic, this could be due to recent her ongoing MRSA infection or osteomyelitis. As we know any underlying infection can disrupt mental status in our geriatric population. Her MRI also indicates possible late subacute infarct or encephalitis. In combination with neuro and medical team, will continue to manage psychiatric symptoms and attempt to achieve stabilization of her Bipolar disorder although this will be difficult as she has ongoing complex medical conditions.    Virtually any medical condition or physiologic stress can precipitate delirium in a susceptible individual, with risk increasing in those with: advanced age, sensory impairments, organic brain disease (stroke, dementia, Parkinsons), psychiatric illness, major chronic medical issues, prolonged hospitalizations, postoperative status, anemia, insomnia/disturbed sleep, and severe pain. Addressing the underlying medical condition and institution of preventative measures are recommended.   Bipolar affective disorder, mania, mild: patient is currently refusing her medications at this  time. Will switch her medications over to IV to help with stabilizations. This includes Haldol and Depakote.  -Continue Depacon 500 mg BID, we will order valproic acid level for Friday morning and adjust accordingly. -Will hold home dose of Seroquel for now.  -Will initiate standing order of Haldol 5mg  po BID, if she refuses this may be administered via IV. Patient with elevated QTc of 483, will need serial daily EKGs as she remains at risk for prolonged QTc.  -Continue one-to-one .  Also encouraged documentation as well in order to assess and monitor patient when psychiatry is not present. -Writer attempted to administer medications while at the bedside. Patient demonstrated some difficulty with drinking from a straw. Recommend speech evaluation.  -May need to consider PMT consult to establish goals of care, as patient has multiple medical conditions of high complexity and increase risk for mortality.    Anxiety: -Continue gabapentin 100 mg BID  Disposition: Supportive therapy provided about ongoing stressors.  Recruitment consultant, FNP 11/22/2020 1:05 PM

## 2020-11-22 NOTE — Progress Notes (Signed)
PROGRESS NOTE                                                                                                                                                                                                             Patient Demographics:    Becky Hendricks, is a 75 y.o. female, DOB - 12/17/1945, DEY:[id number]  Outpatient Primary MD for the patient is [name], MD    LOS - 4  Admit date - 11/17/2020    Chief Complaint  Patient presents with  . ivc  . 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  So far LP, CT scan chest abdomen pelvis nonacute, MRI with questionable occipital lobe changes.   Subjective:   Patient in bed somewhat sleepy due to Ativan she received earlier, in no distress, moves all 4 extremities   Assessment  & Plan :   1.  Acute toxic encephalopathy gradually progressive at SNF - MRI with ?  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  According to brother she has not been taking her psych medications for months, on high-dose Seroquel along with Haldol as needed, will wait for Psych  input.  2. Sepsis in the setting of MRSA bacteremia - ID consulted, on Vancomycin for now, TTE stable, per ID no need for TEE, repeat cultures stable thus far.  3. N&V with stool ball - supportive Rx, bowel regimen. .  4. H/O Sacral Osteomyelitis with healed Scaral Decub Ulcer - continue nursing wound care, stable ESR CRP and trend procalcitonin as well.   5.  Bipolar disorder.  Currently on combination of Depakote and Seroquel.  Psych has seen the patient, request continued follow-up.  According to the brother she stopped taking her psych medications 2 months ago, her main issue this admission seems to be largely psych disorder per neurology, will defer management to psych request Psych to follow daily.  6.  Paroxysmal A. fib.  Mali vas 2 score of greater than 3.  Currently on Eliquis, not on any rate controlling agents.  7.  Dyslipidemia.  On statin.  8.  Chronic diastolic CHF EF 23%.  Currently compensated.  9.  Obesity.  BMI of 31.  Supportive care follow-up with PCP for weight loss.  10.  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 Vanco stopped 11/20/20.   11. ? MRI changes DW Neuro Dr Rory Percy, likely PRESS - control BP.  12. DM type II.  For now sliding scale.  Lab Results  Component Value Date   HGBA1C 6.1 (H) 11/18/2020   CBG (last 3)  Recent Labs    11/22/20 0018 11/22/20 0604 11/22/20 0827  GLUCAP 133* 135* 114*        Condition - Fair  Family Communication  : Brother [name] [phone] over the phone on 11/20/2020 at 9:36 AM, 11/21/20, discussed clearly patient's refusal  to take medications, has been refusing taking her psych medications for several months.  If significant decline he will consider comfort measures.  For now we are trying her best to optimize medication intake and improve her mentation.  Code Status :  Full  Consults  :  Neuro  PUD Prophylaxis : PPI   Procedures  :     MRI - Ill-defined patchy hyperintensity in the left occipital parietal lobe without restricted diffusion or abnormal enhancement. Differential diagnosis includes late subacute infarct, encephalitis.   LP - non acute  CT - 1. Main pulmonary artery measures at the upper limits of normal. Correlate with pulmonary hypertension. 2. Otherwise no acute intrathoracic abnormality. 3. Cholelithiasis with no CT findings of acute cholecystitis. 4. An 8 cm stool ball within the rectum with no associated findings suggest of stercoral colitis. 5. Grossly stable in size uterine fibroids  TTE - 1. Limited study due to poor acoustic windows. Left ventricular ejection fraction, by estimation, is 60 to 65%. The left ventricle has normal function. The left ventricle has no regional wall motion abnormalities. There is mild concentric left ventricular hypertrophy. Left ventricular diastolic parameters are consistent with Grade I diastolic dysfunction (impaired relaxation).  2. Right ventricular systolic function is mildly reduced. The right ventricular size is mildly-to-moderately enlarged.  3. The mitral valve is normal in structure. Trivial mitral valve regurgitation. No evidence of mitral stenosis.  4. The aortic valve is tricuspid. There is mild calcification of the aortic valve. There is mild thickening of the aortic valve. Aortic valve regurgitation is not visualized. Mild aortic valve sclerosis is present, with no evidence of aortic valve stenosis.  5. No valvular vegetations visualized on surface echo      Disposition Plan  :    Status is: Inpatient  Remains  inpatient appropriate because:IV  treatments appropriate due to intensity of illness or inability to take PO   Dispo: The patient is from: SNF              Anticipated d/c is to: SNF              Patient currently is not medically stable to d/c.   Difficult to place patient No   DVT Prophylaxis  :  Eliquis  Lab Results  Component Value Date   PLT 238 11/22/2020    Diet :  Diet Order            DIET SOFT Room service appropriate? Yes; Fluid consistency: Thin  Diet effective now                  Inpatient Medications  Scheduled Meds: . amLODipine  10 mg Oral Daily  . apixaban  5 mg Oral BID  . atorvastatin  10 mg Oral Daily  . bisacodyl  10 mg Rectal Once  . cloNIDine  0.2 mg Transdermal Weekly  . docusate sodium  200 mg Oral BID  . insulin aspart  0-15 Units Subcutaneous Q6H  . lactulose  30 g Oral BID  . multivitamin with minerals  1 tablet Oral Daily  . pantoprazole  40 mg Oral Daily  . polyethylene glycol  17 g Oral BID  . QUEtiapine  200 mg Oral QHS  . QUEtiapine  50 mg Oral QHS   Continuous Infusions: . DAPTOmycin (CUBICIN)  IV 700 mg (11/21/20 1955)  . dextrose 5% lactated ringers with KCl 20 mEq/L 100 mL/hr at 11/22/20 0815  . valproate sodium 500 mg (11/21/20 2135)   PRN Meds:.acetaminophen **OR** acetaminophen, haloperidol lactate, hydrALAZINE, LORazepam, [DISCONTINUED] ondansetron **OR** ondansetron (ZOFRAN) IV, QUEtiapine  Antibiotics  :    Anti-infectives (From admission, onward)   Start     Dose/Rate Route Frequency Ordered Stop   11/20/20 2000  DAPTOmycin (CUBICIN) 700 mg in sodium chloride 0.9 % IVPB        8 mg/kg  88 kg 128 mL/hr over 30 Minutes Intravenous Daily 11/20/20 1657     11/18/20 2200  vancomycin (VANCOREADY) IVPB 1250 mg/250 mL  Status:  Discontinued        1,250 mg 166.7 mL/hr over 90 Minutes Intravenous Every 24 hours 11/17/20 2345 11/20/20 1644   11/18/20 0000  ampicillin (OMNIPEN) 2 g in sodium chloride 0.9 % 100 mL IVPB  Status:  Discontinued        2  g 300 mL/hr over 20 Minutes Intravenous Every 4 hours 11/17/20 2319 11/18/20 1520   11/17/20 2345  vancomycin (VANCOCIN) IVPB 1000 mg/200 mL premix        1,000 mg 200 mL/hr over 60 Minutes Intravenous  Once 11/17/20 2331 11/18/20 0155   11/17/20 2330  cefTRIAXone (ROCEPHIN) 2 g in sodium chloride 0.9 % 100 mL IVPB  Status:  Discontinued        2 g 200 mL/hr over 30 Minutes Intravenous Every 12 hours 11/17/20 2319 11/18/20 1520       Time Spent in minutes  30   Lala Lund M.D on 11/22/2020 at 11:28 AM  To page go to [email or web address]   [facility] -  Office  [phone]    See all Orders from today for further details    Objective:   Vitals:   11/21/20 2011 11/22/20 0020 11/22/20 0345 11/22/20 0802  BP: 126/60 (!) 121/52 93/61 119/71  Pulse: (!) 109 85  81 81  Resp: '18 18 16 ' (!) 22  Temp: 98.3 F (36.8 C) 98.9 F (37.2 C) 97.9 F (36.6 C) 98.9 F (37.2 C)  TempSrc: Oral Oral Oral Axillary  SpO2: 99% 96% 97% 97%  Weight:        Wt Readings from Last 3 Encounters:  11/20/20 88 kg  10/12/20 88.5 kg  05/06/20 92.5 kg     Intake/Output Summary (Last 24 hours) at 11/22/2020 1128 Last data filed at 11/22/2020 0423 Gross per 24 hour  Intake 50 ml  Output 600 ml  Net -550 ml     Physical Exam  More sleepy this morning due to Ativan she received last night, in no distress, moving all 4 extremities to stimuli Magnet.AT,PERRAL Supple Neck,No JVD, No cervical lymphadenopathy appriciated.  Symmetrical Chest wall movement, Good air movement bilaterally, CTAB RRR,No Gallops, Rubs or new Murmurs, No Parasternal Heave +ve B.Sounds, Abd Soft, No tenderness, No organomegaly appriciated, No rebound - guarding or rigidity. No Cyanosis, diffuse macular rash in arms and trunk    Data Review:    CBC Recent Labs  Lab 11/17/20 1600 11/18/20 0154 11/19/20 1933 11/21/20 0245 11/22/20 0451  WBC 12.0* 11.8* 8.1 9.5 8.2  HGB 13.4 12.7 13.3 11.8* 12.4  HCT 42.0 39.7  41.5 36.3 38.4  PLT 248 227 242 233 238  MCV 89.6 89.8 90.4 88.1 89.9  MCH 28.6 28.7 29.0 28.6 29.0  MCHC 31.9 32.0 32.0 32.5 32.3  RDW 14.2 14.2 14.3 14.2 14.8  LYMPHSABS  --  3.1 2.3 2.2 2.9  MONOABS  --  1.2* 0.9 1.0 0.9  EOSABS  --  0.0 0.4 0.5 0.6*  BASOSABS  --  0.0 0.0 0.0 0.0    Recent Labs  Lab 11/17/20 1600 11/17/20 1842 11/18/20 0154 11/18/20 1109 11/18/20 1356 11/19/20 1933 11/20/20 0208 11/21/20 0245 11/22/20 0451  NA 135  --  133*  --   --  139  --  141 142  K 3.9  --  4.1  --   --  4.2  --  3.6 3.3*  CL 97*  --  96*  --   --  104  --  106 106  CO2 26  --  27  --   --  25  --  29 26  GLUCOSE 152*  --  155*  --   --  83  --  139* 135*  BUN 17  --  18  --   --  19  --  20 16  CREATININE 0.51  --  0.69  --   --  0.55  --  0.76 0.67  CALCIUM 9.4  --  8.9  --   --  8.8*  --  9.0 9.3  AST 13*  --  13*  --   --  18  --  12* 11*  ALT 15  --  13  --   --  12  --  13 15  ALKPHOS 54  --  43  --   --  42  --  38 41  BILITOT 0.9  --  0.6  --   --  1.2  --  0.7 0.7  ALBUMIN 3.7  --  3.3*  --  3.7 3.2*  --  2.7* 2.8*  MG  --   --  1.7  --   --  1.9  --  1.7 1.9  CRP  --   --  1.1*  --   --   --  1.1* 1.2* 1.5*  PROCALCITON <0.10  --  <0.10  --   --   --   --  <0.10 <0.10  LATICACIDVEN  --  1.4 1.5 1.5  --   --   --   --   --   INR  --   --   --  1.1  --   --   --   --   --   TSH  --   --  0.771  --   --   --   --   --   --   HGBA1C  --   --  6.1*  --   --   --   --   --   --   AMMONIA  --   --  22  --   --   --   --   --   --   BNP  --   --   --   --   --   --   --  47.7 28.1    ------------------------------------------------------------------------------------------------------------------ No results for input(s): CHOL, HDL, LDLCALC, TRIG, CHOLHDL, LDLDIRECT in the last 72 hours.  Lab Results  Component Value Date   HGBA1C 6.1 (H) 11/18/2020   ------------------------------------------------------------------------------------------------------------------ No  results for input(s): TSH, T4TOTAL, T3FREE, THYROIDAB in the last 72 hours.  Invalid input(s): FREET3  Cardiac Enzymes No results for input(s): CKMB, TROPONINI, MYOGLOBIN in the last 168 hours.  Invalid input(s): CK ------------------------------------------------------------------------------------------------------------------    Component Value Date/Time   BNP 28.1 11/22/2020 0451     Radiology Reports CT ANGIO HEAD W OR WO CONTRAST  Result Date: 11/21/2020 CLINICAL DATA:  Initial evaluation for neuro deficit, stroke suspected. EXAM: CT ANGIOGRAPHY HEAD AND NECK TECHNIQUE: Multidetector CT imaging of the head and neck was performed using the standard protocol during bolus administration of intravenous contrast. Multiplanar CT image reconstructions and MIPs were obtained to evaluate the vascular anatomy. Carotid stenosis measurements (when applicable) are obtained utilizing NASCET criteria, using the distal internal carotid diameter as the denominator. CONTRAST:  91m OMNIPAQUE IOHEXOL 350 MG/ML SOLN COMPARISON:  Prior MRI from 11/19/2020. FINDINGS: CT HEAD FINDINGS Brain: Age-related cerebral atrophy with mild chronic small vessel ischemic disease. Recently identified signal changes involving the left parieto-occipital region not visible by CT. No acute intracranial hemorrhage. No acute large vessel territory infarct. No mass lesion or midline shift. No hydrocephalus or extra-axial fluid collection. Vascular: No hyperdense vessel. Scattered vascular calcifications noted within the carotid siphons. Skull: Scalp soft tissues and calvarium within normal limits. Sinuses: Scattered mucosal thickening noted within the ethmoidal air cells. Small air-fluid level noted within the right sphenoid sinus. Paranasal sinuses are otherwise clear. No mastoid effusion. Orbits: Globes and orbital soft tissues within normal limits. Review of the MIP images confirms the above findings CTA NECK FINDINGS Aortic arch:  Visualized aortic arch within normal limits for caliber with normal 3 vessel morphology. No hemodynamically significant stenosis about the origin the great vessels. Right carotid system: Right common and internal carotid arteries widely patent without stenosis, dissection or occlusion. Left carotid system: Left common and internal carotid arteries widely patent without stenosis, dissection or occlusion. Vertebral arteries: Both vertebral arteries arise from subclavian arteries. No proximal subclavian artery stenosis. Moderate narrowing of the left V2 segment at the level of C3 related to an extrinsic compression from adjacent uncovertebral disease. Vertebral arteries otherwise widely patent without stenosis, dissection or occlusion. Skeleton: No acute osseous finding. No discrete or worrisome osseous lesions. Hyperostosis frontalis  interna noted. Other neck: No other acute soft tissue abnormality within the neck. No mass or adenopathy. Upper chest: Which lies upper chest demonstrates no acute finding. Review of the MIP images confirms the above findings CTA HEAD FINDINGS Anterior circulation: Both internal carotid arteries widely patent to the termini without stenosis. A1 segments widely patent. Normal anterior communicating artery complex. Both anterior cerebral arteries widely patent to their distal aspects without stenosis. No M1 stenosis or occlusion. Normal MCA bifurcations. Distal MCA branches well perfused and symmetric. Posterior circulation: Both V4 segments patent to the vertebrobasilar junction without stenosis. Neither PICA origin well visualized. Basilar widely patent to its distal aspect without stenosis. Superior cerebellar arteries patent bilaterally. Both PCAs primarily supplied via the basilar and are well perfused to there distal aspects. Venous sinuses: Patent allowing for timing contrast bolus. Anatomic variants: None significant.  No intracranial aneurysm. Review of the MIP images confirms the  above findings IMPRESSION: CT HEAD IMPRESSION: 1. No acute intracranial abnormality. Recently identified signal changes at the left parieto-occipital region not visible by CT. 2. Age-related cerebral atrophy with mild chronic small vessel ischemic disease. CTA HEAD AND NECK IMPRESSION: Negative CTA of the head and neck. No large vessel occlusion, hemodynamically significant stenosis, or other acute vascular abnormality. Electronically Signed   By: Jeannine Boga M.D.   On: 11/21/2020 23:22   DG Abd 1 View  Result Date: 11/21/2020 CLINICAL DATA:  Nausea EXAM: ABDOMEN - 1 VIEW COMPARISON:  None. FINDINGS: There is diffuse stool throughout colon. No bowel dilatation or air-fluid level to suggest bowel obstruction. No free air. There are pelvic calcifications consistent with phleboliths. Visualized lung bases clear. IMPRESSION: Diffuse stool throughout colon. Question a degree of underlying constipation. No bowel obstruction or free air. Electronically Signed   By: Lowella Grip III M.D.   On: 11/21/2020 12:18   CT HEAD WO CONTRAST  Result Date: 11/17/2020 CLINICAL DATA:  Altered mental status. EXAM: CT HEAD WITHOUT CONTRAST TECHNIQUE: Contiguous axial images were obtained from the base of the skull through the vertex without intravenous contrast. COMPARISON:  May 05, 2020 FINDINGS: Brain: There is mild cerebral atrophy with widening of the extra-axial spaces and ventricular dilatation. There are areas of decreased attenuation within the white matter tracts of the supratentorial brain, consistent with microvascular disease changes. Vascular: No hyperdense vessel or unexpected calcification. Skull: Normal. Negative for fracture or focal lesion. Sinuses/Orbits: Mild bilateral ethmoid sinus mucosal thickening is seen. Other: The study is limited secondary to patient motion. IMPRESSION: 1. Generalized cerebral atrophy. 2. No acute intracranial abnormality. Electronically Signed   By: Virgina Norfolk  M.D.   On: 11/17/2020 17:17   CT ANGIO NECK W OR WO CONTRAST  Result Date: 11/21/2020 CLINICAL DATA:  Initial evaluation for neuro deficit, stroke suspected. EXAM: CT ANGIOGRAPHY HEAD AND NECK TECHNIQUE: Multidetector CT imaging of the head and neck was performed using the standard protocol during bolus administration of intravenous contrast. Multiplanar CT image reconstructions and MIPs were obtained to evaluate the vascular anatomy. Carotid stenosis measurements (when applicable) are obtained utilizing NASCET criteria, using the distal internal carotid diameter as the denominator. CONTRAST:  108m OMNIPAQUE IOHEXOL 350 MG/ML SOLN COMPARISON:  Prior MRI from 11/19/2020. FINDINGS: CT HEAD FINDINGS Brain: Age-related cerebral atrophy with mild chronic small vessel ischemic disease. Recently identified signal changes involving the left parieto-occipital region not visible by CT. No acute intracranial hemorrhage. No acute large vessel territory infarct. No mass lesion or midline shift. No hydrocephalus or extra-axial fluid collection.  Vascular: No hyperdense vessel. Scattered vascular calcifications noted within the carotid siphons. Skull: Scalp soft tissues and calvarium within normal limits. Sinuses: Scattered mucosal thickening noted within the ethmoidal air cells. Small air-fluid level noted within the right sphenoid sinus. Paranasal sinuses are otherwise clear. No mastoid effusion. Orbits: Globes and orbital soft tissues within normal limits. Review of the MIP images confirms the above findings CTA NECK FINDINGS Aortic arch: Visualized aortic arch within normal limits for caliber with normal 3 vessel morphology. No hemodynamically significant stenosis about the origin the great vessels. Right carotid system: Right common and internal carotid arteries widely patent without stenosis, dissection or occlusion. Left carotid system: Left common and internal carotid arteries widely patent without stenosis, dissection  or occlusion. Vertebral arteries: Both vertebral arteries arise from subclavian arteries. No proximal subclavian artery stenosis. Moderate narrowing of the left V2 segment at the level of C3 related to an extrinsic compression from adjacent uncovertebral disease. Vertebral arteries otherwise widely patent without stenosis, dissection or occlusion. Skeleton: No acute osseous finding. No discrete or worrisome osseous lesions. Hyperostosis frontalis interna noted. Other neck: No other acute soft tissue abnormality within the neck. No mass or adenopathy. Upper chest: Which lies upper chest demonstrates no acute finding. Review of the MIP images confirms the above findings CTA HEAD FINDINGS Anterior circulation: Both internal carotid arteries widely patent to the termini without stenosis. A1 segments widely patent. Normal anterior communicating artery complex. Both anterior cerebral arteries widely patent to their distal aspects without stenosis. No M1 stenosis or occlusion. Normal MCA bifurcations. Distal MCA branches well perfused and symmetric. Posterior circulation: Both V4 segments patent to the vertebrobasilar junction without stenosis. Neither PICA origin well visualized. Basilar widely patent to its distal aspect without stenosis. Superior cerebellar arteries patent bilaterally. Both PCAs primarily supplied via the basilar and are well perfused to there distal aspects. Venous sinuses: Patent allowing for timing contrast bolus. Anatomic variants: None significant.  No intracranial aneurysm. Review of the MIP images confirms the above findings IMPRESSION: CT HEAD IMPRESSION: 1. No acute intracranial abnormality. Recently identified signal changes at the left parieto-occipital region not visible by CT. 2. Age-related cerebral atrophy with mild chronic small vessel ischemic disease. CTA HEAD AND NECK IMPRESSION: Negative CTA of the head and neck. No large vessel occlusion, hemodynamically significant stenosis, or  other acute vascular abnormality. Electronically Signed   By: Jeannine Boga M.D.   On: 11/21/2020 23:22   CT Chest W Contrast  Result Date: 11/17/2020 CLINICAL DATA:  Fever, altered mental status.  Nausea and vomiting. EXAM: CT CHEST, ABDOMEN, AND PELVIS WITH CONTRAST TECHNIQUE: Multidetector CT imaging of the chest, abdomen and pelvis was performed following the standard protocol during bolus administration of intravenous contrast. CONTRAST:  172m OMNIPAQUE IOHEXOL 300 MG/ML  SOLN COMPARISON:  CT abdomen pelvis 12/20/2019, CT angiography chest 12/24/2019 FINDINGS: CT CHEST FINDINGS Cardiovascular: Normal heart size. No significant pericardial effusion. The thoracic aorta is normal in caliber. Mild atherosclerotic plaque of the thoracic aorta. The main pulmonary artery measures at the upper limits of normal: 3.2 cm. Mediastinum/Nodes: No enlarged mediastinal, hilar, or axillary lymph nodes. Thyroid gland, trachea, and esophagus demonstrate no significant findings. Lungs/Pleura: Right lower lobe atelectasis with persistent elevated right hemidiaphragm. Limited evaluation for pulmonary nodule due to respiratory motion artifact. No pulmonary mass. No focal consolidation. No pleural effusion. No pneumothorax. Musculoskeletal: No chest wall abnormality. No suspicious lytic or blastic osseous lesions. No acute displaced fracture. Multilevel degenerative changes of the spine. CT ABDOMEN PELVIS FINDINGS  Hepatobiliary: No focal liver abnormality. Gallstone noted within the gallbladder lumen. No gallbladder wall thickening or pericholecystic fluid. No biliary dilatation. Pancreas: No focal lesion. Normal pancreatic contour. No surrounding inflammatory changes. No main pancreatic ductal dilatation. Spleen: Normal in size without focal abnormality. Adrenals/Urinary Tract: No adrenal nodule bilaterally. Bilateral kidneys enhance symmetrically. Exophytic hypodense 2.2 cm lesion within the right kidney likely represent  simple renal cysts. Slightly increased density likely due to streak artifact at this level. No hydronephrosis. No hydroureter. The urinary bladder is unremarkable. On delayed imaging, there is no urothelial wall thickening and there are no filling defects in the opacified portions of the bilateral collecting systems or ureters. Stomach/Bowel: Stomach is within normal limits. No evidence of bowel wall thickening or dilatation. 8 cm stool ball within the rectum. The appendix not definitely identified. Vascular/Lymphatic: No abdominal aorta or iliac aneurysm. Mild atherosclerotic plaque of the aorta and its branches. No abdominal, pelvic, or inguinal lymphadenopathy. Reproductive: Difficult to measure grossly stable uterine fibroids. No adnexal lesion. Other: No intraperitoneal free fluid. No intraperitoneal free gas. No organized fluid collection. Musculoskeletal: Tiny fat containing left inguinal hernia. No suspicious lytic or blastic osseous lesions. No acute displaced fracture. Multilevel degenerative changes of the spine. IMPRESSION: 1. Main pulmonary artery measures at the upper limits of normal. Correlate with pulmonary hypertension. 2. Otherwise no acute intrathoracic abnormality. 3. Cholelithiasis with no CT findings of acute cholecystitis. 4. An 8 cm stool ball within the rectum with no associated findings suggest of stercoral colitis. 5. 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 Electronically Signed   By: Iven Finn M.D.   On: 11/17/2020 20:07   MR BRAIN W WO CONTRAST  Result Date: 11/19/2020 CLINICAL DATA:  Mental status change.  Encephalopathy EXAM: MRI HEAD WITHOUT AND WITH CONTRAST TECHNIQUE: Multiplanar, multiecho pulse sequences of the brain and surrounding structures were obtained without and with intravenous contrast. CONTRAST:  7.78m GADAVIST GADOBUTROL 1 MMOL/ML IV SOLN COMPARISON:  CT head 11/17/2020.  MRI head 12/24/2019 FINDINGS: Brain: Patchy areas of hyperintensity in the left  occipital pole extending into the left posterior parietal lobe best seen on FLAIR. This involves primarily the white matter but some areas of cortex. This area does not show restricted diffusion or susceptibility. This area was normal on the prior MRI 2021. No abnormal enhancement. Moderate atrophy. Mild white matter changes consistent with chronic microvascular ischemia. Diffusion-weighted imaging negative for acute infarct. No hemorrhage or mass lesion. Vascular: Normal arterial flow voids Skull and upper cervical spine: Negative Sinuses/Orbits: Mild mucosal edema paranasal sinuses. Negative orbit Other: None IMPRESSION: Ill-defined patchy hyperintensity in the left occipital parietal lobe without restricted diffusion or abnormal enhancement. Differential diagnosis includes late subacute infarct, encephalitis. Tumor not considered likely. Correlate with symptoms. Electronically Signed   By: CFranchot GalloM.D.   On: 11/19/2020 15:24   CT Abdomen Pelvis W Contrast  Result Date: 11/17/2020 CLINICAL DATA:  Fever, altered mental status.  Nausea and vomiting. EXAM: CT CHEST, ABDOMEN, AND PELVIS WITH CONTRAST TECHNIQUE: Multidetector CT imaging of the chest, abdomen and pelvis was performed following the standard protocol during bolus administration of intravenous contrast. CONTRAST:  1062mOMNIPAQUE IOHEXOL 300 MG/ML  SOLN COMPARISON:  CT abdomen pelvis 12/20/2019, CT angiography chest 12/24/2019 FINDINGS: CT CHEST FINDINGS Cardiovascular: Normal heart size. No significant pericardial effusion. The thoracic aorta is normal in caliber. Mild atherosclerotic plaque of the thoracic aorta. The main pulmonary artery measures at the upper limits of normal: 3.2 cm. Mediastinum/Nodes: No enlarged mediastinal, hilar, or  axillary lymph nodes. Thyroid gland, trachea, and esophagus demonstrate no significant findings. Lungs/Pleura: Right lower lobe atelectasis with persistent elevated right hemidiaphragm. Limited evaluation for  pulmonary nodule due to respiratory motion artifact. No pulmonary mass. No focal consolidation. No pleural effusion. No pneumothorax. Musculoskeletal: No chest wall abnormality. No suspicious lytic or blastic osseous lesions. No acute displaced fracture. Multilevel degenerative changes of the spine. CT ABDOMEN PELVIS FINDINGS Hepatobiliary: No focal liver abnormality. Gallstone noted within the gallbladder lumen. No gallbladder wall thickening or pericholecystic fluid. No biliary dilatation. Pancreas: No focal lesion. Normal pancreatic contour. No surrounding inflammatory changes. No main pancreatic ductal dilatation. Spleen: Normal in size without focal abnormality. Adrenals/Urinary Tract: No adrenal nodule bilaterally. Bilateral kidneys enhance symmetrically. Exophytic hypodense 2.2 cm lesion within the right kidney likely represent simple renal cysts. Slightly increased density likely due to streak artifact at this level. No hydronephrosis. No hydroureter. The urinary bladder is unremarkable. On delayed imaging, there is no urothelial wall thickening and there are no filling defects in the opacified portions of the bilateral collecting systems or ureters. Stomach/Bowel: Stomach is within normal limits. No evidence of bowel wall thickening or dilatation. 8 cm stool ball within the rectum. The appendix not definitely identified. Vascular/Lymphatic: No abdominal aorta or iliac aneurysm. Mild atherosclerotic plaque of the aorta and its branches. No abdominal, pelvic, or inguinal lymphadenopathy. Reproductive: Difficult to measure grossly stable uterine fibroids. No adnexal lesion. Other: No intraperitoneal free fluid. No intraperitoneal free gas. No organized fluid collection. Musculoskeletal: Tiny fat containing left inguinal hernia. No suspicious lytic or blastic osseous lesions. No acute displaced fracture. Multilevel degenerative changes of the spine. IMPRESSION: 1. Main pulmonary artery measures at the upper  limits of normal. Correlate with pulmonary hypertension. 2. Otherwise no acute intrathoracic abnormality. 3. Cholelithiasis with no CT findings of acute cholecystitis. 4. An 8 cm stool ball within the rectum with no associated findings suggest of stercoral colitis. 5. 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 Electronically Signed   By: Iven Finn M.D.   On: 11/17/2020 20:07   EEG adult  Result Date: 11/21/2020 Plancher, Baron Sane, MD     11/21/2020  3:50 PM PROCEDURE: EEG routine 22 minutes with video study  DATES OF TEST: 11/21/2020  REASON FOR TEST: acute encephalopathy  AED/Sedative MEDICATIONS: n/a  TECHNIQUE: This is an 18 channel digital EEG recording using the standard international 10/20 system of electrode placement with one channel EKG recording. Pt was awake during this recording.  FINDINGS: Background rhythm: symmetric diffuse 4-8 Hz, mostly myogenic artifact. Amplitude :  low Continuity: continuous Breach effect:  NO Variability: YES Reactivity: YES Rhythmic delta activity: NO Periodic discharges: NO Sporadic epileptiform discharges: NO Electrographic/electroclinical seizures: NO  Limited EKG reveals no abnormalities.   IMPRESSION: This is an abnormal study due to - 1. Moderate to mild Generalized slowing is a non-specific finding consistent with a generalized disturbance of cerebral functioning including toxic, metabolic, or structural abnormalities that are multi-focal or diffuse. 2. No definite epileptiform discharges or electrographic seizures noted.    ECHOCARDIOGRAM COMPLETE  Result Date: 11/19/2020    ECHOCARDIOGRAM REPORT   Patient Name:   ARIAH MOWER Date of Exam: 11/19/2020 Medical Rec #:  [id number]      Height:       66.0 in Accession #:    [id number]     Weight:       195.2 lb Date of Birth:  1945/09/23       BSA:  1.979 m Patient Age:    15 years       BP:           145/76 mmHg Patient Gender: F              HR:           75 bpm. Exam Location:  Inpatient Procedure: 2D Echo  Indications:    bacteremia  History:        Patient has prior history of Echocardiogram examinations, most                 recent 12/23/2019. CHF, Arrythmias:Atrial Fibrillation,                 Signs/Symptoms:Bacteremia; Risk Factors:Diabetes.  Sonographer:    Johny Chess Referring Phys: [id number] [facility]  Sonographer Comments: Image acquisition challenging due to uncooperative patient and Image acquisition challenging due to patient body habitus. IMPRESSIONS  1. Limited study due to poor acoustic windows. Left ventricular ejection fraction, by estimation, is 60 to 65%. The left ventricle has normal function. The left ventricle has no regional wall motion abnormalities. There is mild concentric left ventricular hypertrophy. Left ventricular diastolic parameters are consistent with Grade I diastolic dysfunction (impaired relaxation).  2. Right ventricular systolic function is mildly reduced. The right ventricular size is mildly-to-moderately enlarged.  3. The mitral valve is normal in structure. Trivial mitral valve regurgitation. No evidence of mitral stenosis.  4. The aortic valve is tricuspid. There is mild calcification of the aortic valve. There is mild thickening of the aortic valve. Aortic valve regurgitation is not visualized. Mild aortic valve sclerosis is present, with no evidence of aortic valve stenosis.  5. No valvular vegetations visualized on surface echo. Pending clinical suspicion for infective endocarditis, can consider TEE for further evaluation. Comparison(s): Compared to prior TTE on 95/28/41, the RV systolic function appears mildly improved. Otherwise there is no significant change. Conclusion(s)/Recommendation(s): No evidence of valvular vegetations on this transthoracic echocardiogram. Would recommend a transesophageal echocardiogram to exclude infective endocarditis if clinically indicated. FINDINGS  Left Ventricle: Limited study due to poor acoustic windows. Left ventricular  ejection fraction, by estimation, is 60 to 65%. The left ventricle has normal function. The left ventricle has no regional wall motion abnormalities. Definity contrast agent was given IV to delineate the left ventricular endocardial borders. The left ventricular internal cavity size was normal in size. There is mild concentric left ventricular hypertrophy. Left ventricular diastolic parameters are consistent with Grade I diastolic dysfunction (impaired relaxation). Right Ventricle: The right ventricular size is mildly-to-moderately enlarged. Right vetricular wall thickness was not well visualized. Right ventricular systolic function is mildly reduced. Left Atrium: Left atrial size was normal in size. Right Atrium: Right atrial size was normal in size. Pericardium: There is no evidence of pericardial effusion. Mitral Valve: The mitral valve is normal in structure. There is mild thickening of the mitral valve leaflet(s). There is mild calcification of the mitral valve leaflet(s). Trivial mitral valve regurgitation. No evidence of mitral valve stenosis. Tricuspid Valve: The tricuspid valve is normal in structure. Tricuspid valve regurgitation is trivial. Aortic Valve: The aortic valve is tricuspid. There is mild calcification of the aortic valve. There is mild thickening of the aortic valve. Aortic valve regurgitation is not visualized. Mild aortic valve sclerosis is present, with no evidence of aortic valve stenosis. Pulmonic Valve: The pulmonic valve was not well visualized. Pulmonic valve regurgitation is not visualized. Aorta: The aortic root and ascending aorta are structurally  normal, with no evidence of dilitation. IAS/Shunts: No atrial level shunt detected by color flow Doppler.  LEFT VENTRICLE PLAX 2D LVIDd:         4.26 cm LVIDs:         2.71 cm LV PW:         1.17 cm LV IVS:        1.06 cm LVOT diam:     1.80 cm LVOT Area:     2.54 cm  RIGHT VENTRICLE RV S prime:     9.90 cm/s LEFT ATRIUM         Index LA  diam:    3.80 cm 1.92 cm/m   AORTA Ao Root diam: 2.90 cm Ao Asc diam:  3.40 cm  SHUNTS Systemic Diam: 1.80 cm Gwyndolyn Kaufman MD Electronically signed by Gwyndolyn Kaufman MD Signature Date/Time: 11/19/2020/5:00:20 PM    Final    DG FL GUIDED LUMBAR PUNCTURE  Result Date: 11/18/2020 CLINICAL DATA:  Encephalopathy.  Fever.  Rule out meningitis EXAM: DIAGNOSTIC LUMBAR PUNCTURE UNDER FLUOROSCOPIC GUIDANCE COMPARISON:  CT abdomen pelvis 11/17/2020 FLUOROSCOPY TIME:  Fluoroscopy Time:  0 minutes 48 seconds Radiation Exposure Index (if provided by the fluoroscopic device): Number of Acquired Spot Images: 1 PROCEDURE: Patient was sedated prior to the study. The patient was uncooperative. Informed consent was obtained from the patient's brother by telephone prior to the procedure, including potential complications of headache, allergy, and pain. With the patient prone, the lower back was prepped with Betadine. 1% Lidocaine was used for local anesthesia. Lumbar puncture was performed at the L2-3 level using a 20 gauge needle with return of clear CSF with an opening pressure of 13 cm water. Six ml of CSF were obtained for laboratory studies. The patient tolerated the procedure well and there were no apparent complications. IMPRESSION: Successful lumbar puncture.  Clear CSF. Electronically Signed   By: Franchot Gallo M.D.   On: 11/18/2020 14:20

## 2020-11-22 NOTE — Progress Notes (Signed)
Full assessment not completed due to patient sleeping after a long combative night, per night RN. Will assess again at 1000.

## 2020-11-22 NOTE — Progress Notes (Signed)
Bladder scan performed due to patient not voiding, 318 mL obtained on scan, MD updated, new orders to in and out cath once. Catherization performed using sterile technique and two RN's, 500 mL obtained from bladder scan, urine is amber and clear, MD updated.

## 2020-11-23 ENCOUNTER — Inpatient Hospital Stay: Payer: Self-pay

## 2020-11-23 DIAGNOSIS — F3111 Bipolar disorder, current episode manic without psychotic features, mild: Secondary | ICD-10-CM | POA: Diagnosis not present

## 2020-11-23 LAB — COMPREHENSIVE METABOLIC PANEL
ALT: 16 U/L (ref 0–44)
AST: 12 U/L — ABNORMAL LOW (ref 15–41)
Albumin: 2.6 g/dL — ABNORMAL LOW (ref 3.5–5.0)
Alkaline Phosphatase: 38 U/L (ref 38–126)
Anion gap: 6 (ref 5–15)
BUN: 9 mg/dL (ref 8–23)
CO2: 29 mmol/L (ref 22–32)
Calcium: 8.9 mg/dL (ref 8.9–10.3)
Chloride: 108 mmol/L (ref 98–111)
Creatinine, Ser: 0.58 mg/dL (ref 0.44–1.00)
GFR, Estimated: >60 mL/min (ref >60)
Glucose, Bld: 109 mg/dL — ABNORMAL HIGH (ref 70–99)
Potassium: 4.1 mmol/L (ref 3.5–5.1)
Sodium: 143 mmol/L (ref 135–145)
Total Bilirubin: 0.8 mg/dL (ref 0.3–1.2)
Total Protein: 5.2 g/dL — ABNORMAL LOW (ref 6.5–8.1)

## 2020-11-23 LAB — GLUCOSE, CAPILLARY
Glucose-Capillary: 98 mg/dL (ref 70–99)
Glucose-Capillary: 154 mg/dL — ABNORMAL HIGH (ref 70–99)
Glucose-Capillary: 96 mg/dL (ref 70–99)

## 2020-11-23 LAB — CBC WITH DIFFERENTIAL/PLATELET
Abs Immature Granulocytes: 0.03 10*3/uL (ref 0.00–0.07)
Basophils Absolute: 0 10*3/uL (ref 0.0–0.1)
Basophils Relative: 0 %
Eosinophils Absolute: 0.5 10*3/uL (ref 0.0–0.5)
Eosinophils Relative: 5 %
HCT: 37.2 % (ref 36.0–46.0)
Hemoglobin: 12 g/dL (ref 12.0–15.0)
Immature Granulocytes: 0 %
Lymphocytes Relative: 34 %
Lymphs Abs: 3.3 10*3/uL (ref 0.7–4.0)
MCH: 29.3 pg (ref 26.0–34.0)
MCHC: 32.3 g/dL (ref 30.0–36.0)
MCV: 91 fL (ref 80.0–100.0)
Monocytes Absolute: 1.2 10*3/uL — ABNORMAL HIGH (ref 0.1–1.0)
Monocytes Relative: 12 %
Neutro Abs: 4.7 10*3/uL (ref 1.7–7.7)
Neutrophils Relative %: 49 %
Platelets: 237 10*3/uL (ref 150–400)
RBC: 4.09 MIL/uL (ref 3.87–5.11)
RDW: 15.1 % (ref 11.5–15.5)
WBC: 9.7 10*3/uL (ref 4.0–10.5)
nRBC: 0 % (ref 0.0–0.2)

## 2020-11-23 LAB — C-REACTIVE PROTEIN: CRP: 1.6 mg/dL — ABNORMAL HIGH (ref <1.0)

## 2020-11-23 LAB — MAGNESIUM: Magnesium: 1.9 mg/dL (ref 1.7–2.4)

## 2020-11-23 LAB — PROCALCITONIN: Procalcitonin: <0.1 ng/mL

## 2020-11-23 LAB — BRAIN NATRIURETIC PEPTIDE: B Natriuretic Peptide: 15.6 pg/mL (ref 0.0–100.0)

## 2020-11-23 MED ORDER — HALOPERIDOL 5 MG PO TABS
10.0000 mg | ORAL_TABLET | Freq: Three times a day (TID) | ORAL | Status: DC
  Administered 2020-11-23 – 2020-11-24 (×5): 10 mg via ORAL
  Filled 2020-11-23 (×5): qty 2

## 2020-11-23 MED ORDER — HALOPERIDOL LACTATE 5 MG/ML IJ SOLN
5.0000 mg | Freq: Four times a day (QID) | INTRAMUSCULAR | Status: DC | PRN
  Filled 2020-11-23: qty 1

## 2020-11-23 MED ORDER — HALOPERIDOL LACTATE 5 MG/ML IJ SOLN
10.0000 mg | Freq: Three times a day (TID) | INTRAMUSCULAR | Status: DC
  Administered 2020-11-25: 10 mg via INTRAVENOUS
  Filled 2020-11-23: qty 2

## 2020-11-23 MED ORDER — MAGNESIUM HYDROXIDE 400 MG/5ML PO SUSP
30.0000 mL | Freq: Two times a day (BID) | ORAL | Status: AC
  Administered 2020-11-23 (×2): 30 mL via ORAL
  Filled 2020-11-23 (×2): qty 30

## 2020-11-23 NOTE — Progress Notes (Signed)
OT NOTE  PALM GUARD Left Hand Wear schedule:  4 hours on Wear 4 hours off    Palm guard for positioning - please remove during shift to assess skin for redness and pressure marks. If present, please d/c for 15 minutes. If the marks remain after 15 minutes notify OT immediately [phone].  OT to continue to follow and assess R UE hand needs.   Pt demonstrates AROM thumb and 2nd digits to nearly full ROM. Pt with flexion contractures to 3rd 4th 5th digits. Pt with noticeable nail  Marks in palm. Pt thanking staff for the palm guard saying feels good.   Timmothy Euler, OTR/L  [facility] Pager: [phone] Office: [phone] .

## 2020-11-23 NOTE — Consult Note (Addendum)
[facility] Face-to-Face Psychiatry Consult   Reason for Consult: Severe bipolar disorder with psychosis Referring Physician:  Dr Thedore Mins Patient Identification: Becky Hendricks MRN:  [id number] Principal Diagnosis: Bipolar affective disorder, currently manic, mild (HCC) Diagnosis:  Principal Problem:   Bipolar affective disorder, currently manic, mild (HCC) Active Problems:   Acute metabolic encephalopathy   Severe sepsis (HCC)   AF (paroxysmal atrial fibrillation) (HCC)   Sacral wound   Chronic diastolic CHF (congestive heart failure) (HCC)   Bipolar disorder (HCC)   SIRS (systemic inflammatory response syndrome) (HCC)   Nausea and vomiting   Mixed diabetic hyperlipidemia associated with type 2 diabetes mellitus (HCC)   Type 2 diabetes mellitus with hyperglycemia, with long-term current use of insulin (HCC)   Constipation   Encephalopathy   MRSA bacteremia   Total Time spent with patient: 20 minutes  Subjective:   Becky Hendricks is a 75 y.o. female patient admitted with AMS and agitation, history of bipolar disorder. Patient is seen and chart reviewed with Dr. Jola Babinski. She is observed sitting in bed, and requesting for the doctor upon entering the room. " Look at my damn arm. Look at it." Patient is able to extend her arm and show erythema, swelling and localized irritation from previous IV site. Patient appears pretty irritated and althoguh she is easily to redirect she remains very tangential with increasing flight of ideas. She also presents with some delusional thinking and paranoia. " You are released to go home. Go straight home so you're husband will not have to look for you. The [place] soldiers are doing all they can and I do not want you to get shot. Get home now I tell you. " When assessing for her safety and if she felt safe while in the hospital she answered " yes"  She continues to remain confused, and appears to be having periods of regression. As noted yesetreday her mentation and  orientation were improving. Patient continues to present as labile, irritable, with pressured speech. Although she denies any mania symptoms and denies the need for psychotropic medications.   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  Patient is unable to provide a history at this time due to being minimally responsive.   Past Psychiatric History: bipolar  Risk to Self:  none Risk to Others:  none Prior Inpatient Therapy:  unknown Prior Outpatient Therapy:  unknown  Past Medical History:  Past Medical History:  Diagnosis Date  . Atrial fibrillation (HCC)   . Bipolar disorder (HCC)   . Bipolar disorder (HCC) 04/27/2020  . Clostridium difficile diarrhea 08/02/2019  . Dehydration   . Essential hypertension   . Fall 02/11/2020  . Morbid obesity (HCC)   . Osteomyelitis (HCC) 12/03/2019  . Pneumonia due to COVID-19 virus 09/15/2019  . Type 2 diabetes mellitus (HCC)   . Weakness     Past Surgical History:  Procedure Laterality Date  . INCISION AND DRAINAGE PERIRECTAL ABSCESS Left 12/04/2019   Procedure: IRRIGATION AND DEBRIDEMENT LEFT BUTTOCK ABSCESS;  Surgeon: Violeta Gelinas, MD;  Location: [facility] OR;  Service: General;  Laterality: Left;  . INCISION AND DRAINAGE PERIRECTAL ABSCESS Left 12/10/2019   Procedure: REPEAT IRRIGATION  AND DEBRIDEMENT BUTTOCK  ABSCESS;  Surgeon: Abigail Miyamoto, MD;  Location: [facility] OR;  Service: General;  Laterality: Left;   Family History:  Family History  Family history unknown: Yes   Family Psychiatric  History: unknown Social  History:  Social History   Substance and Sexual Activity  Alcohol Use Not Currently     Social History   Substance and Sexual Activity  Drug Use Not Currently    Social History   Socioeconomic History  . Marital status: Single    Spouse name: Not on file  . Number of children: Not on file  . Years of education: Not on file  . Highest education level: Not on file  Occupational History  . Not on file  Tobacco Use  . Smoking status: Never Smoker  . Smokeless tobacco: Never Used  Vaping Use  . Vaping Use: Unknown  Substance and Sexual Activity  . Alcohol use: Not Currently  . Drug use: Not Currently  . Sexual activity: Not Currently    Birth control/protection: Post-menopausal  Other Topics Concern  . Not on file  Social History Narrative  . Not on file   Social Determinants of Health   Financial Resource Strain: Not on file  Food Insecurity: Not on file  Transportation Needs: Not on file  Physical Activity: Not on file  Stress: Not on file  Social Connections: Not on file   Additional Social History:    Allergies:   Allergies  Allergen Reactions  . Chlorhexidine Gluconate Itching  . Acetazolamide Er Rash    Labs:  Results for orders placed or performed during the hospital encounter of 11/17/20 (from the past 48 hour(s))  Glucose, capillary     Status: Abnormal   Collection Time: 11/21/20 12:24 PM  Result Value Ref Range   Glucose-Capillary 109 (H) 70 - 99 mg/dL    Comment: Glucose reference range applies only to samples taken after fasting for at least 8 hours.  Glucose, capillary     Status: Abnormal   Collection Time: 11/21/20  6:48 PM  Result Value Ref Range   Glucose-Capillary 130 (H) 70 - 99 mg/dL    Comment: Glucose reference range applies only to samples taken after fasting for at least 8 hours.   Comment 1 Notify RN   Glucose, capillary     Status: Abnormal   Collection Time: 11/22/20 12:18 AM  Result Value Ref Range   Glucose-Capillary 133  (H) 70 - 99 mg/dL    Comment: Glucose reference range applies only to samples taken after fasting for at least 8 hours.   Comment 1 Notify RN    Comment 2 Document in Chart   Magnesium     Status: None   Collection Time: 11/22/20  4:51 AM  Result Value Ref Range   Magnesium 1.9 1.7 - 2.4 mg/dL    Comment: Performed at [facility], [address], [place], [place] [address]  Comprehensive metabolic panel     Status: Abnormal   Collection Time: 11/22/20  4:51 AM  Result Value Ref Range   Sodium 142 135 - 145 mmol/L   Potassium 3.3 (L) 3.5 - 5.1 mmol/L   Chloride 106 98 - 111 mmol/L   CO2 26 22 - 32 mmol/L   Glucose, Bld 135 (H) 70 - 99 mg/dL    Comment: Glucose reference range applies only to samples taken after fasting for at least 8 hours.   BUN 16 8 - 23 mg/dL   Creatinine,  Ser 0.67 0.44 - 1.00 mg/dL   Calcium 9.3 8.9 - 16.1 mg/dL   Total Protein 5.7 (L) 6.5 - 8.1 g/dL   Albumin 2.8 (L) 3.5 - 5.0 g/dL   AST 11 (L) 15 - 41 U/L   ALT 15 0 - 44 U/L   Alkaline Phosphatase 41 38 - 126 U/L   Total Bilirubin 0.7 0.3 - 1.2 mg/dL   GFR, Estimated >09 >60 mL/min    Comment: (NOTE) Calculated using the CKD-EPI Creatinine Equation (2021)    Anion gap 10 5 - 15    Comment: Performed at [facility], [address] [place], [place] [address]  Brain natriuretic peptide     Status: None   Collection Time: 11/22/20  4:51 AM  Result Value Ref Range   B Natriuretic Peptide 28.1 0.0 - 100.0 pg/mL    Comment: Performed at [facility], [address] [place], [place] [address]  CBC with Differential/Platelet     Status: Abnormal   Collection Time: 11/22/20  4:51 AM  Result Value Ref Range   WBC 8.2 4.0 - 10.5 K/uL   RBC 4.27 3.87 - 5.11 MIL/uL   Hemoglobin 12.4 12.0 - 15.0 g/dL   HCT 47.8 29.5 - 62.1 %   MCV 89.9 80.0 - 100.0 fL   MCH 29.0 26.0 - 34.0 pg   MCHC 32.3 30.0 - 36.0 g/dL   RDW 30.8 65.7 - 84.6 %   Platelets 238 150 - 400 K/uL   nRBC 0.0 0.0 - 0.2 %    Neutrophils Relative % 47 %   Neutro Abs 3.9 1.7 - 7.7 K/uL   Lymphocytes Relative 35 %   Lymphs Abs 2.9 0.7 - 4.0 K/uL   Monocytes Relative 11 %   Monocytes Absolute 0.9 0.1 - 1.0 K/uL   Eosinophils Relative 7 %   Eosinophils Absolute 0.6 (H) 0.0 - 0.5 K/uL   Basophils Relative 0 %   Basophils Absolute 0.0 0.0 - 0.1 K/uL   Immature Granulocytes 0 %   Abs Immature Granulocytes 0.02 0.00 - 0.07 K/uL    Comment: Performed at [facility], [address], [place], [place] [address]  C-reactive protein     Status: Abnormal   Collection Time: 11/22/20  4:51 AM  Result Value Ref Range   CRP 1.5 (H) <1.0 mg/dL    Comment: Performed at [facility], [address], [place], [place] [address]  Procalcitonin     Status: None   Collection Time: 11/22/20  4:51 AM  Result Value Ref Range   Procalcitonin <0.10 ng/mL    Comment:        Interpretation: PCT (Procalcitonin) <= 0.5 ng/mL: Systemic infection (sepsis) is not likely. Local bacterial infection is possible. (NOTE)       Sepsis PCT Algorithm           Lower Respiratory Tract                                      Infection PCT Algorithm    ----------------------------     ----------------------------         PCT < 0.25 ng/mL                PCT < 0.10 ng/mL          Strongly encourage  Strongly discourage   discontinuation of antibiotics    initiation of antibiotics    ----------------------------     -----------------------------       PCT 0.25 - 0.50 ng/mL            PCT 0.10 - 0.25 ng/mL               OR       >80% decrease in PCT            Discourage initiation of                                            antibiotics      Encourage discontinuation           of antibiotics    ----------------------------     -----------------------------         PCT >= 0.50 ng/mL              PCT 0.26 - 0.50 ng/mL               AND        <80% decrease in PCT             Encourage initiation of                                              antibiotics       Encourage continuation           of antibiotics    ----------------------------     -----------------------------        PCT >= 0.50 ng/mL                  PCT > 0.50 ng/mL               AND         increase in PCT                  Strongly encourage                                      initiation of antibiotics    Strongly encourage escalation           of antibiotics                                     -----------------------------                                           PCT <= 0.25 ng/mL                                                 OR                                        >  80% decrease in PCT                                      Discontinue / Do not initiate                                             antibiotics  Performed at [facility], [address] [place], [place] [address]   Glucose, capillary     Status: Abnormal   Collection Time: 11/22/20  6:04 AM  Result Value Ref Range   Glucose-Capillary 135 (H) 70 - 99 mg/dL    Comment: Glucose reference range applies only to samples taken after fasting for at least 8 hours.   Comment 1 Notify RN    Comment 2 Document in Chart   Glucose, capillary     Status: Abnormal   Collection Time: 11/22/20  8:27 AM  Result Value Ref Range   Glucose-Capillary 114 (H) 70 - 99 mg/dL    Comment: Glucose reference range applies only to samples taken after fasting for at least 8 hours.  Glucose, capillary     Status: Abnormal   Collection Time: 11/22/20 11:33 AM  Result Value Ref Range   Glucose-Capillary 169 (H) 70 - 99 mg/dL    Comment: Glucose reference range applies only to samples taken after fasting for at least 8 hours.  Glucose, capillary     Status: None   Collection Time: 11/22/20  4:02 PM  Result Value Ref Range   Glucose-Capillary 93 70 - 99 mg/dL    Comment: Glucose reference range applies only to samples taken after fasting for at least 8 hours.  Glucose, capillary     Status:  Abnormal   Collection Time: 11/22/20 11:40 PM  Result Value Ref Range   Glucose-Capillary 168 (H) 70 - 99 mg/dL    Comment: Glucose reference range applies only to samples taken after fasting for at least 8 hours.   Comment 1 Notify RN    Comment 2 Document in Chart   Magnesium     Status: None   Collection Time: 11/23/20  5:28 AM  Result Value Ref Range   Magnesium 1.9 1.7 - 2.4 mg/dL    Comment: Performed at [facility], [address], [place], [place] [address]  Comprehensive metabolic panel     Status: Abnormal   Collection Time: 11/23/20  5:28 AM  Result Value Ref Range   Sodium 143 135 - 145 mmol/L   Potassium 4.1 3.5 - 5.1 mmol/L   Chloride 108 98 - 111 mmol/L   CO2 29 22 - 32 mmol/L   Glucose, Bld 109 (H) 70 - 99 mg/dL    Comment: Glucose reference range applies only to samples taken after fasting for at least 8 hours.   BUN 9 8 - 23 mg/dL   Creatinine, Ser 2.84 0.44 - 1.00 mg/dL   Calcium 8.9 8.9 - 13.2 mg/dL   Total Protein 5.2 (L) 6.5 - 8.1 g/dL   Albumin 2.6 (L) 3.5 - 5.0 g/dL   AST 12 (L) 15 - 41 U/L   ALT 16 0 - 44 U/L   Alkaline Phosphatase 38 38 - 126 U/L   Total Bilirubin 0.8 0.3 - 1.2 mg/dL   GFR, Estimated >44 >01 mL/min  Comment: (NOTE) Calculated using the CKD-EPI Creatinine Equation (2021)    Anion gap 6 5 - 15    Comment: Performed at [facility], [address], [place], [place] [address]  Brain natriuretic peptide     Status: None   Collection Time: 11/23/20  5:28 AM  Result Value Ref Range   B Natriuretic Peptide 15.6 0.0 - 100.0 pg/mL    Comment: Performed at [facility], [address] [place], [place] [address]  CBC with Differential/Platelet     Status: Abnormal   Collection Time: 11/23/20  5:28 AM  Result Value Ref Range   WBC 9.7 4.0 - 10.5 K/uL   RBC 4.09 3.87 - 5.11 MIL/uL   Hemoglobin 12.0 12.0 - 15.0 g/dL   HCT 29.5 62.1 - 30.8 %   MCV 91.0 80.0 - 100.0 fL   MCH 29.3 26.0 - 34.0 pg   MCHC 32.3 30.0 - 36.0  g/dL   RDW 65.7 84.6 - 96.2 %   Platelets 237 150 - 400 K/uL   nRBC 0.0 0.0 - 0.2 %   Neutrophils Relative % 49 %   Neutro Abs 4.7 1.7 - 7.7 K/uL   Lymphocytes Relative 34 %   Lymphs Abs 3.3 0.7 - 4.0 K/uL   Monocytes Relative 12 %   Monocytes Absolute 1.2 (H) 0.1 - 1.0 K/uL   Eosinophils Relative 5 %   Eosinophils Absolute 0.5 0.0 - 0.5 K/uL   Basophils Relative 0 %   Basophils Absolute 0.0 0.0 - 0.1 K/uL   Immature Granulocytes 0 %   Abs Immature Granulocytes 0.03 0.00 - 0.07 K/uL    Comment: Performed at [facility], [address] [place], [place] [address]  C-reactive protein     Status: Abnormal   Collection Time: 11/23/20  5:28 AM  Result Value Ref Range   CRP 1.6 (H) <1.0 mg/dL    Comment: Performed at [facility], [address] [place], [place] [address]  Procalcitonin     Status: None   Collection Time: 11/23/20  5:28 AM  Result Value Ref Range   Procalcitonin <0.10 ng/mL    Comment:        Interpretation: PCT (Procalcitonin) <= 0.5 ng/mL: Systemic infection (sepsis) is not likely. Local bacterial infection is possible. (NOTE)       Sepsis PCT Algorithm           Lower Respiratory Tract                                      Infection PCT Algorithm    ----------------------------     ----------------------------         PCT < 0.25 ng/mL                PCT < 0.10 ng/mL          Strongly encourage             Strongly discourage   discontinuation of antibiotics    initiation of antibiotics    ----------------------------     -----------------------------       PCT 0.25 - 0.50 ng/mL            PCT 0.10 - 0.25 ng/mL               OR       >80% decrease in PCT  Discourage initiation of                                            antibiotics      Encourage discontinuation           of antibiotics    ----------------------------     -----------------------------         PCT >= 0.50 ng/mL              PCT 0.26 - 0.50 ng/mL               AND         <80% decrease in PCT             Encourage initiation of                                             antibiotics       Encourage continuation           of antibiotics    ----------------------------     -----------------------------        PCT >= 0.50 ng/mL                  PCT > 0.50 ng/mL               AND         increase in PCT                  Strongly encourage                                      initiation of antibiotics    Strongly encourage escalation           of antibiotics                                     -----------------------------                                           PCT <= 0.25 ng/mL                                                 OR                                        > 80% decrease in PCT                                      Discontinue / Do not initiate  antibiotics  Performed at [facility], [address] [place], [place] [address]   Glucose, capillary     Status: None   Collection Time: 11/23/20  5:47 AM  Result Value Ref Range   Glucose-Capillary 98 70 - 99 mg/dL    Comment: Glucose reference range applies only to samples taken after fasting for at least 8 hours.   Comment 1 Notify RN    Comment 2 Document in Chart     Current Facility-Administered Medications  Medication Dose Route Frequency Provider Last Rate Last Admin  . acetaminophen (TYLENOL) tablet 650 mg  650 mg Oral Q6H PRN Shalhoub, Deno Lunger, MD       Or  . acetaminophen (TYLENOL) suppository 650 mg  650 mg Rectal Q6H PRN Shalhoub, Deno Lunger, MD      . amLODipine (NORVASC) tablet 5 mg  5 mg Oral Daily Leroy Sea, MD   5 mg at 11/23/20 1002  . apixaban (ELIQUIS) tablet 5 mg  5 mg Oral BID Marguerita Merles Endicott, DO   5 mg at 11/23/20 1001  . atorvastatin (LIPITOR) tablet 10 mg  10 mg Oral Daily Leroy Sea, MD   10 mg at 11/23/20 1001  . cloNIDine (CATAPRES - Dosed in mg/24 hr) patch 0.2 mg  0.2 mg Transdermal Weekly  Leroy Sea, MD   0.2 mg at 11/20/20 1810  . DAPTOmycin (CUBICIN) 700 mg in sodium chloride 0.9 % IVPB  8 mg/kg Intravenous Q2000 Mosetta Anis, [facility]   Stopped at 11/22/20 2135  . docusate sodium (COLACE) capsule 200 mg  200 mg Oral BID Susa Raring K, MD   200 mg at 11/23/20 1001  . haloperidol (HALDOL) tablet 5 mg  5 mg Oral BID Maryagnes Amos, FNP   5 mg at 11/23/20 1001   Or  . haloperidol lactate (HALDOL) injection 5 mg  5 mg Intravenous BID Maryagnes Amos, FNP   5 mg at 11/22/20 2100  . haloperidol lactate (HALDOL) injection 5 mg  5 mg Intravenous Q12H PRN Maryagnes Amos, FNP   5 mg at 11/21/20 1314  . hydrALAZINE (APRESOLINE) injection 10 mg  10 mg Intravenous Q6H PRN Leroy Sea, MD   10 mg at 11/21/20 1220  . insulin aspart (novoLOG) injection 0-15 Units  0-15 Units Subcutaneous Q6H Shalhoub, Deno Lunger, MD   3 Units at 11/23/20 0110  . LORazepam (ATIVAN) injection 1 mg  1 mg Intravenous Q8H PRN Leroy Sea, MD      . magnesium hydroxide (MILK OF MAGNESIA) suspension 30 mL  30 mL Oral BID Leroy Sea, MD      . multivitamin with minerals tablet 1 tablet  1 tablet Oral Daily Leroy Sea, MD   1 tablet at 11/23/20 1001  . ondansetron (ZOFRAN) injection 4 mg  4 mg Intravenous Q6H PRN Shalhoub, Deno Lunger, MD      . pantoprazole (PROTONIX) EC tablet 40 mg  40 mg Oral Daily Leroy Sea, MD   40 mg at 11/23/20 1002  . polyethylene glycol (MIRALAX / GLYCOLAX) packet 17 g  17 g Oral BID Leroy Sea, MD   17 g at 11/23/20 1002  . QUEtiapine (SEROQUEL) tablet 12.5 mg  12.5 mg Oral BID PRN Charm Rings, NP   12.5 mg at 11/22/20 2240  . valproate (DEPACON) 500 mg in dextrose 5 % 50 mL IVPB  500 mg Intravenous Q12H Leroy Sea, MD 55 mL/hr  at 11/23/20 0529 500 mg at 11/23/20 [id number]    Musculoskeletal: Strength & Muscle Tone: within normal limits Gait & Station: did not witness Patient leans: N/A  Psychiatric Specialty  Exam: Physical Exam Vitals and nursing note reviewed.  Constitutional:      Appearance: Normal appearance.  HENT:     Head: Normocephalic.     Nose: Nose normal.  Pulmonary:     Effort: Pulmonary effort is normal.  Neurological:     General: No focal deficit present.     Mental Status: She is alert.  Psychiatric:        Attention and Perception: She is inattentive.        Mood and Affect: Mood is anxious. Affect is angry.        Speech: Speech normal.        Behavior: Behavior is agitated.        Thought Content: Thought content normal.        Cognition and Memory: Cognition is impaired.        Judgment: Judgment is impulsive.     Review of Systems  Psychiatric/Behavioral: Positive for memory loss. The patient is nervous/anxious.   All other systems reviewed and are negative.   Blood pressure (!) 142/75, pulse 84, temperature 98.5 F (36.9 C), temperature source Axillary, resp. rate 18, weight 88 kg, SpO2 99 %.Body mass index is 31.31 kg/m.  General Appearance: Casual  Eye Contact:  Fair  Speech:  Clear and Coherent and Pressured  Volume:  Increased  Mood:  Angry and Irritable  Affect:  Labile and Full Range  Thought Process:  Irrelevant and Descriptions of Associations: Tangential  Orientation:  Other:  alert and oriented x 2  Thought Content:  Illogical, Paranoid Ideation and Tangential  Suicidal Thoughts:  No  Homicidal Thoughts:  No  Memory:  Immediate;   Fair Recent;   Fair  Judgement:  Impaired  Insight:  Lacking  Psychomotor Activity:  psychomotor agitation  Concentration:  Concentration: NA and Attention Span: NA  Recall:  NA  Fund of Knowledge:  NA  Language:  NA  Akathisia:  NA  Handed:  Right  AIMS (if indicated):     Assets:  Housing Leisure Time Resilience Social Support  ADL's:  Impaired  Cognition:  Impaired,  Moderate  Sleep:      Physical Exam: Physical Exam Vitals and nursing note reviewed.  Constitutional:      Appearance: Normal  appearance.  HENT:     Head: Normocephalic.     Nose: Nose normal.  Pulmonary:     Effort: Pulmonary effort is normal.  Neurological:     General: No focal deficit present.     Mental Status: She is alert.  Psychiatric:        Attention and Perception: She is inattentive.        Mood and Affect: Mood is anxious. Affect is angry.        Speech: Speech normal.        Behavior: Behavior is agitated.        Thought Content: Thought content normal.        Cognition and Memory: Cognition is impaired.        Judgment: Judgment is impulsive.    Review of Systems  Psychiatric/Behavioral: Positive for memory loss. The patient is nervous/anxious.   All other systems reviewed and are negative.  Blood pressure (!) 142/75, pulse 84, temperature 98.5 F (36.9 C), temperature source Axillary, resp. rate 18, weight  88 kg, SpO2 99 %. Body mass index is 31.31 kg/m.  Treatment Plan Summary: Suspected acute encephalopathy in the setting of underlying Bipolar disorder. Patient is presenting as psychotic and manic, this could be due to recent her ongoing MRSA infection or osteomyelitis. As we know any underlying infection can disrupt mental status in our geriatric population. Her MRI also indicates possible late subacute infarct or encephalitis. In combination with neuro and medical team, will continue to manage psychiatric symptoms and attempt to achieve stabilization of her Bipolar disorder although this will be difficult as she has ongoing complex medical conditions.  Will adjust medications appropriately. Will continue to encourage patient to take her medications orally. It appears she has lost her IVC access, and she is complaining of pain at the site.   Bipolar affective disorder, mania, mild: patient is currently refusing her medications at this time. Will switch her medications over to IV to help with stabilizations. This includes Haldol and Depakote.  -Continue Depacon 500 mg BID, we will order  valproic acid level for Friday morning and adjust accordingly. -Will hold home dose of Seroquel for now.  -Will increase Haldol 10mg  po TID, if she refuses this may be administered via IV. Patient with QTc of 447, will need serial daily EKGs as she remains at risk for prolonged QTc.  -Will add prn Haldol 5mg  po/iv q6 hr.  -Continue one-to-one .  -May need to consider PMT consult to establish goals of care, as patient has multiple medical conditions of high complexity and increase risk for mortality.    Anxiety: -Continue gabapentin 100 mg BID  Disposition: Supportive therapy provided about ongoing stressors.  , FNP 11/23/2020 11:36 AM

## 2020-11-23 NOTE — Progress Notes (Signed)
PROGRESS NOTE                                                                                                                                                                                                             Patient Demographics:    Becky Hendricks, is a 75 y.o. female, DOB - [birth year], VWP:[id number]  Outpatient Primary MD for the patient is [name], MD    LOS - 5  Admit date - 11/17/2020    Chief Complaint  Patient presents with  . ivc  . 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  So far LP, CT scan chest abdomen pelvis nonacute, MRI with questionable occipital lobe changes.   Subjective:   Patient in bed, appears comfortable, more alert, denies any headache, no fever, no chest pain or pressure, no shortness of breath , no abdominal pain. No new focal weakness.    Assessment  & Plan :   1.  Acute toxic encephalopathy gradually progressive at SNF - MRI with ?  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  2. Sepsis in the setting of MRSA bacteremia - ID consulted, on Vancomycin >> Dapto, TTE stable, per ID no need  for TEE, repeat cultures stable thus far.  Per ID total of 4 weeks of IV antibiotics.  3.  Bipolar disorder.  Currently on combination of Depakote and Haldol, per Psych Seroquel held.  Psych has seen the patient, request continued follow-up.  According to the brother she had stopped taking her psych medications 2 months ago, her main issue this admission seems to be largely psych disorder per neurology, will defer management to psych request Psych to follow daily.  4. H/O Sacral Osteomyelitis with healed Scaral Decub Ulcer - continue nursing wound care, stable ESR CRP and procalcitonin as well.   5. N&V with stool ball - supportive Rx, bowel regimen.   6.  Paroxysmal A. fib.  Mali vas 2 score of greater than 3.  Currently on Eliquis, not on any rate controlling agents.  7.  Dyslipidemia.  On statin.  8.  Chronic diastolic CHF EF 17%.  Currently compensated.  9.  Obesity.  BMI of 31.  Supportive care follow-up with PCP for weight loss.  10.  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 Vanco stopped 11/20/20.   11. ? MRI changes DW Neuro Dr Rory Percy, likely PRESS - control BP.  12.  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  13. DM type II.  For now sliding scale.  Lab Results  Component Value Date   HGBA1C 6.1 (H) 11/18/2020   CBG (last 3)  Recent Labs    11/22/20 1602 11/22/20 2340 11/23/20 0547  GLUCAP 93 168* 98        Condition - Fair  Family Communication  : Brother [name] [phone] over the phone on 11/20/2020 at 9:36 AM, 11/21/20, discussed clearly  patient's refusal to take medications, has been refusing taking her psych medications for several months.  If significant decline he will consider comfort measures.  For now we are trying her best to optimize medication intake and improve her mentation.  Code Status :  Full  Consults  :  Neuro, Psych, ID  PUD Prophylaxis : PPI   Procedures  :     MRI - Ill-defined patchy hyperintensity in the left occipital parietal lobe without restricted diffusion or abnormal enhancement. Differential diagnosis includes late subacute infarct, encephalitis.   LP - non acute  CT - 1. Main pulmonary artery measures at the upper limits of normal. Correlate with pulmonary hypertension. 2. Otherwise no acute intrathoracic abnormality. 3. Cholelithiasis with no CT findings of acute cholecystitis. 4. An 8 cm stool ball within the rectum with no associated findings suggest of stercoral colitis. 5. Grossly stable in size uterine fibroids  TTE - 1. Limited study due to poor acoustic windows. Left ventricular ejection fraction, by estimation, is 60 to 65%. The left ventricle has normal function. The left ventricle has no regional wall motion abnormalities. There is mild concentric left ventricular hypertrophy. Left ventricular diastolic parameters are consistent with Grade I diastolic dysfunction (impaired relaxation).  2. Right ventricular systolic function is mildly reduced. The right ventricular size is mildly-to-moderately enlarged.  3. The mitral valve is normal in structure. Trivial mitral valve regurgitation. No evidence of mitral stenosis.  4. The aortic valve is tricuspid. There is mild calcification of the aortic valve. There is mild thickening of the aortic valve. Aortic valve regurgitation is not visualized. Mild aortic valve sclerosis is present, with no evidence of aortic valve stenosis.  5. No valvular vegetations visualized on surface echo      Disposition Plan  :  Status is: Inpatient  Remains  inpatient appropriate because:IV treatments appropriate due to intensity of illness or inability to take PO   Dispo: The patient is from: SNF              Anticipated d/c is to: SNF              Patient currently is not medically stable to d/c.   Difficult to place patient No   DVT Prophylaxis  :  Eliquis  Lab Results  Component Value Date   PLT 237 11/23/2020    Diet :  Diet Order            DIET SOFT Room service appropriate? Yes; Fluid consistency: Thin  Diet effective now                  Inpatient Medications  Scheduled Meds: . amLODipine  5 mg Oral Daily  . apixaban  5 mg Oral BID  . atorvastatin  10 mg Oral Daily  . cloNIDine  0.2 mg Transdermal Weekly  . docusate sodium  200 mg Oral BID  . haloperidol  5 mg Oral BID   Or  . haloperidol lactate  5 mg Intravenous BID  . insulin aspart  0-15 Units Subcutaneous Q6H  . magnesium hydroxide  30 mL Oral BID  . multivitamin with minerals  1 tablet Oral Daily  . pantoprazole  40 mg Oral Daily  . polyethylene glycol  17 g Oral BID   Continuous Infusions: . DAPTOmycin (CUBICIN)  IV Stopped (11/22/20 2135)  . valproate sodium 500 mg (11/23/20 0529)   PRN Meds:.acetaminophen **OR** acetaminophen, haloperidol lactate, hydrALAZINE, LORazepam, [DISCONTINUED] ondansetron **OR** ondansetron (ZOFRAN) IV, QUEtiapine  Antibiotics  :    Anti-infectives (From admission, onward)   Start     Dose/Rate Route Frequency Ordered Stop   11/20/20 2000  DAPTOmycin (CUBICIN) 700 mg in sodium chloride 0.9 % IVPB        8 mg/kg  88 kg 128 mL/hr over 30 Minutes Intravenous Daily 11/20/20 1657     11/18/20 2200  vancomycin (VANCOREADY) IVPB 1250 mg/250 mL  Status:  Discontinued        1,250 mg 166.7 mL/hr over 90 Minutes Intravenous Every 24 hours 11/17/20 2345 11/20/20 1644   11/18/20 0000  ampicillin (OMNIPEN) 2 g in sodium chloride 0.9 % 100 mL IVPB  Status:  Discontinued        2 g 300 mL/hr over 20 Minutes Intravenous Every 4  hours 11/17/20 2319 11/18/20 1520   11/17/20 2345  vancomycin (VANCOCIN) IVPB 1000 mg/200 mL premix        1,000 mg 200 mL/hr over 60 Minutes Intravenous  Once 11/17/20 2331 11/18/20 0155   11/17/20 2330  cefTRIAXone (ROCEPHIN) 2 g in sodium chloride 0.9 % 100 mL IVPB  Status:  Discontinued        2 g 200 mL/hr over 30 Minutes Intravenous Every 12 hours 11/17/20 2319 11/18/20 1520       Time Spent in minutes  30   Lala Lund M.D on 11/23/2020 at 11:16 AM  To page go to [email or web address]   [facility] -  Office  [phone]    See all Orders from today for further details    Objective:   Vitals:   11/22/20 1600 11/22/20 1933 11/22/20 2332 11/23/20 0811  BP: (!) 147/78 (!) 151/78 (!) 145/78 (!) 142/75  Pulse: 96 (!) 102 92 84  Resp: _0 Temp:  98 F (36.7 C) 98 F (36.7 C) 98.5 F (36.9 C)  TempSrc:    Axillary  SpO2: 99% 98% 97% 99%  Weight:        Wt Readings from Last 3 Encounters:  11/20/20 88 kg  10/12/20 88.5 kg  05/06/20 92.5 kg     Intake/Output Summary (Last 24 hours) at 11/23/2020 1116 Last data filed at 11/23/2020 0452 Gross per 24 hour  Intake 1359.27 ml  Output 818 ml  Net 541.27 ml     Physical Exam  Awake more Alert, No new F.N deficits,   [place].AT,PERRAL Supple Neck,No JVD, No cervical lymphadenopathy appriciated.  Symmetrical Chest wall movement, Good air movement bilaterally, CTAB RRR,No Gallops, Rubs or new Murmurs, No Parasternal Heave +ve B.Sounds, Abd Soft, No tenderness, No organomegaly appriciated, No rebound - guarding or rigidity. No Cyanosis, diffuse macular rash in arms and trunk improving     Data Review:    CBC Recent Labs  Lab 11/18/20 0154 11/19/20 1933 11/21/20 0245 11/22/20 0451 11/23/20 0528  WBC 11.8* 8.1 9.5 8.2 9.7  HGB 12.7 13.3 11.8* 12.4 12.0  HCT 39.7 41.5 36.3 38.4 37.2  PLT 227 242 233 238 237  MCV 89.8 90.4 88.1 89.9 91.0  MCH 28.7 29.0 28.6 29.0 29.3  MCHC 32.0 32.0 32.5 32.3  32.3  RDW 14.2 14.3 14.2 14.8 15.1  LYMPHSABS 3.1 2.3 2.2 2.9 3.3  MONOABS 1.2* 0.9 1.0 0.9 1.2*  EOSABS 0.0 0.4 0.5 0.6* 0.5  BASOSABS 0.0 0.0 0.0 0.0 0.0    Recent Labs  Lab 11/17/20 1600 11/17/20 1842 11/18/20 0154 11/18/20 1109 11/18/20 1356 11/19/20 1933 11/20/20 0208 11/21/20 0245 11/22/20 0451 11/23/20 0528  NA 135  --  133*  --   --  139  --  141 142 143  K 3.9  --  4.1  --   --  4.2  --  3.6 3.3* 4.1  CL 97*  --  96*  --   --  104  --  106 106 108  CO2 26  --  27  --   --  25  --  _0 GLUCOSE 152*  --  155*  --   --  83  --  139* 135* 109*  BUN 17  --  18  --   --  19  --  _1 CREATININE 0.51  --  0.69  --   --  0.55  --  0.76 0.67 0.58  CALCIUM 9.4  --  8.9  --   --  8.8*  --  9.0 9.3 8.9  AST 13*  --  13*  --   --  18  --  12* 11* 12*  ALT 15  --  13  --   --  12  --  _2 ALKPHOS 54  --  43  --   --  42  --  38 41 38  BILITOT 0.9  --  0.6  --   --  1.2  --  0.7 0.7 0.8  ALBUMIN 3.7  --  3.3*  --  3.7 3.2*  --  2.7* 2.8* 2.6*  MG  --   --  1.7  --   --  1.9  --  1.7 1.9 1.9  CRP  --   --  1.1*  --   --   --  1.1* 1.2* 1.5* 1.6*  PROCALCITON <0.10  --  <0.10  --   --   --   --  <  0.10 <0.10 <0.10  LATICACIDVEN  --  1.4 1.5 1.5  --   --   --   --   --   --   INR  --   --   --  1.1  --   --   --   --   --   --   TSH  --   --  0.771  --   --   --   --   --   --   --   HGBA1C  --   --  6.1*  --   --   --   --   --   --   --   AMMONIA  --   --  22  --   --   --   --   --   --   --   BNP  --   --   --   --   --   --   --  47.7 28.1 15.6    ------------------------------------------------------------------------------------------------------------------ No results for input(s): CHOL, HDL, LDLCALC, TRIG, CHOLHDL, LDLDIRECT in the last 72 hours.  Lab Results  Component Value Date   HGBA1C 6.1 (H) 11/18/2020   ------------------------------------------------------------------------------------------------------------------ No results for input(s):  TSH, T4TOTAL, T3FREE, THYROIDAB in the last 72 hours.  Invalid input(s): FREET3  Cardiac Enzymes No results for input(s): CKMB, TROPONINI, MYOGLOBIN in the last 168 hours.  Invalid input(s): CK ------------------------------------------------------------------------------------------------------------------    Component Value Date/Time   BNP 15.6 11/23/2020 0528     Radiology Reports CT ANGIO HEAD W OR WO CONTRAST  Result Date: 11/21/2020 CLINICAL DATA:  Initial evaluation for neuro deficit, stroke suspected. EXAM: CT ANGIOGRAPHY HEAD AND NECK TECHNIQUE: Multidetector CT imaging of the head and neck was performed using the standard protocol during bolus administration of intravenous contrast. Multiplanar CT image reconstructions and MIPs were obtained to evaluate the vascular anatomy. Carotid stenosis measurements (when applicable) are obtained utilizing NASCET criteria, using the distal internal carotid diameter as the denominator. CONTRAST:  10m OMNIPAQUE IOHEXOL 350 MG/ML SOLN COMPARISON:  Prior MRI from 11/19/2020. FINDINGS: CT HEAD FINDINGS Brain: Age-related cerebral atrophy with mild chronic small vessel ischemic disease. Recently identified signal changes involving the left parieto-occipital region not visible by CT. No acute intracranial hemorrhage. No acute large vessel territory infarct. No mass lesion or midline shift. No hydrocephalus or extra-axial fluid collection. Vascular: No hyperdense vessel. Scattered vascular calcifications noted within the carotid siphons. Skull: Scalp soft tissues and calvarium within normal limits. Sinuses: Scattered mucosal thickening noted within the ethmoidal air cells. Small air-fluid level noted within the right sphenoid sinus. Paranasal sinuses are otherwise clear. No mastoid effusion. Orbits: Globes and orbital soft tissues within normal limits. Review of the MIP images confirms the above findings CTA NECK FINDINGS Aortic arch: Visualized aortic arch  within normal limits for caliber with normal 3 vessel morphology. No hemodynamically significant stenosis about the origin the great vessels. Right carotid system: Right common and internal carotid arteries widely patent without stenosis, dissection or occlusion. Left carotid system: Left common and internal carotid arteries widely patent without stenosis, dissection or occlusion. Vertebral arteries: Both vertebral arteries arise from subclavian arteries. No proximal subclavian artery stenosis. Moderate narrowing of the left V2 segment at the level of C3 related to an extrinsic compression from adjacent uncovertebral disease. Vertebral arteries otherwise widely patent without stenosis, dissection or occlusion. Skeleton: No acute osseous finding. No discrete or worrisome osseous lesions. Hyperostosis frontalis interna noted. Other neck: No  other acute soft tissue abnormality within the neck. No mass or adenopathy. Upper chest: Which lies upper chest demonstrates no acute finding. Review of the MIP images confirms the above findings CTA HEAD FINDINGS Anterior circulation: Both internal carotid arteries widely patent to the termini without stenosis. A1 segments widely patent. Normal anterior communicating artery complex. Both anterior cerebral arteries widely patent to their distal aspects without stenosis. No M1 stenosis or occlusion. Normal MCA bifurcations. Distal MCA branches well perfused and symmetric. Posterior circulation: Both V4 segments patent to the vertebrobasilar junction without stenosis. Neither PICA origin well visualized. Basilar widely patent to its distal aspect without stenosis. Superior cerebellar arteries patent bilaterally. Both PCAs primarily supplied via the basilar and are well perfused to there distal aspects. Venous sinuses: Patent allowing for timing contrast bolus. Anatomic variants: None significant.  No intracranial aneurysm. Review of the MIP images confirms the above findings  IMPRESSION: CT HEAD IMPRESSION: 1. No acute intracranial abnormality. Recently identified signal changes at the left parieto-occipital region not visible by CT. 2. Age-related cerebral atrophy with mild chronic small vessel ischemic disease. CTA HEAD AND NECK IMPRESSION: Negative CTA of the head and neck. No large vessel occlusion, hemodynamically significant stenosis, or other acute vascular abnormality. Electronically Signed   By: Jeannine Boga M.D.   On: 11/21/2020 23:22   DG Abd 1 View  Result Date: 11/21/2020 CLINICAL DATA:  Nausea EXAM: ABDOMEN - 1 VIEW COMPARISON:  None. FINDINGS: There is diffuse stool throughout colon. No bowel dilatation or air-fluid level to suggest bowel obstruction. No free air. There are pelvic calcifications consistent with phleboliths. Visualized lung bases clear. IMPRESSION: Diffuse stool throughout colon. Question a degree of underlying constipation. No bowel obstruction or free air. Electronically Signed   By: Lowella Grip III M.D.   On: 11/21/2020 12:18   CT HEAD WO CONTRAST  Result Date: 11/17/2020 CLINICAL DATA:  Altered mental status. EXAM: CT HEAD WITHOUT CONTRAST TECHNIQUE: Contiguous axial images were obtained from the base of the skull through the vertex without intravenous contrast. COMPARISON:  May 05, 2020 FINDINGS: Brain: There is mild cerebral atrophy with widening of the extra-axial spaces and ventricular dilatation. There are areas of decreased attenuation within the white matter tracts of the supratentorial brain, consistent with microvascular disease changes. Vascular: No hyperdense vessel or unexpected calcification. Skull: Normal. Negative for fracture or focal lesion. Sinuses/Orbits: Mild bilateral ethmoid sinus mucosal thickening is seen. Other: The study is limited secondary to patient motion. IMPRESSION: 1. Generalized cerebral atrophy. 2. No acute intracranial abnormality. Electronically Signed   By: Virgina Norfolk M.D.   On:  11/17/2020 17:17   CT ANGIO NECK W OR WO CONTRAST  Result Date: 11/21/2020 CLINICAL DATA:  Initial evaluation for neuro deficit, stroke suspected. EXAM: CT ANGIOGRAPHY HEAD AND NECK TECHNIQUE: Multidetector CT imaging of the head and neck was performed using the standard protocol during bolus administration of intravenous contrast. Multiplanar CT image reconstructions and MIPs were obtained to evaluate the vascular anatomy. Carotid stenosis measurements (when applicable) are obtained utilizing NASCET criteria, using the distal internal carotid diameter as the denominator. CONTRAST:  38m OMNIPAQUE IOHEXOL 350 MG/ML SOLN COMPARISON:  Prior MRI from 11/19/2020. FINDINGS: CT HEAD FINDINGS Brain: Age-related cerebral atrophy with mild chronic small vessel ischemic disease. Recently identified signal changes involving the left parieto-occipital region not visible by CT. No acute intracranial hemorrhage. No acute large vessel territory infarct. No mass lesion or midline shift. No hydrocephalus or extra-axial fluid collection. Vascular: No hyperdense vessel. Scattered  vascular calcifications noted within the carotid siphons. Skull: Scalp soft tissues and calvarium within normal limits. Sinuses: Scattered mucosal thickening noted within the ethmoidal air cells. Small air-fluid level noted within the right sphenoid sinus. Paranasal sinuses are otherwise clear. No mastoid effusion. Orbits: Globes and orbital soft tissues within normal limits. Review of the MIP images confirms the above findings CTA NECK FINDINGS Aortic arch: Visualized aortic arch within normal limits for caliber with normal 3 vessel morphology. No hemodynamically significant stenosis about the origin the great vessels. Right carotid system: Right common and internal carotid arteries widely patent without stenosis, dissection or occlusion. Left carotid system: Left common and internal carotid arteries widely patent without stenosis, dissection or  occlusion. Vertebral arteries: Both vertebral arteries arise from subclavian arteries. No proximal subclavian artery stenosis. Moderate narrowing of the left V2 segment at the level of C3 related to an extrinsic compression from adjacent uncovertebral disease. Vertebral arteries otherwise widely patent without stenosis, dissection or occlusion. Skeleton: No acute osseous finding. No discrete or worrisome osseous lesions. Hyperostosis frontalis interna noted. Other neck: No other acute soft tissue abnormality within the neck. No mass or adenopathy. Upper chest: Which lies upper chest demonstrates no acute finding. Review of the MIP images confirms the above findings CTA HEAD FINDINGS Anterior circulation: Both internal carotid arteries widely patent to the termini without stenosis. A1 segments widely patent. Normal anterior communicating artery complex. Both anterior cerebral arteries widely patent to their distal aspects without stenosis. No M1 stenosis or occlusion. Normal MCA bifurcations. Distal MCA branches well perfused and symmetric. Posterior circulation: Both V4 segments patent to the vertebrobasilar junction without stenosis. Neither PICA origin well visualized. Basilar widely patent to its distal aspect without stenosis. Superior cerebellar arteries patent bilaterally. Both PCAs primarily supplied via the basilar and are well perfused to there distal aspects. Venous sinuses: Patent allowing for timing contrast bolus. Anatomic variants: None significant.  No intracranial aneurysm. Review of the MIP images confirms the above findings IMPRESSION: CT HEAD IMPRESSION: 1. No acute intracranial abnormality. Recently identified signal changes at the left parieto-occipital region not visible by CT. 2. Age-related cerebral atrophy with mild chronic small vessel ischemic disease. CTA HEAD AND NECK IMPRESSION: Negative CTA of the head and neck. No large vessel occlusion, hemodynamically significant stenosis, or other  acute vascular abnormality. Electronically Signed   By: Jeannine Boga M.D.   On: 11/21/2020 23:22   CT Chest W Contrast  Result Date: 11/17/2020 CLINICAL DATA:  Fever, altered mental status.  Nausea and vomiting. EXAM: CT CHEST, ABDOMEN, AND PELVIS WITH CONTRAST TECHNIQUE: Multidetector CT imaging of the chest, abdomen and pelvis was performed following the standard protocol during bolus administration of intravenous contrast. CONTRAST:  154m OMNIPAQUE IOHEXOL 300 MG/ML  SOLN COMPARISON:  CT abdomen pelvis 12/20/2019, CT angiography chest 12/24/2019 FINDINGS: CT CHEST FINDINGS Cardiovascular: Normal heart size. No significant pericardial effusion. The thoracic aorta is normal in caliber. Mild atherosclerotic plaque of the thoracic aorta. The main pulmonary artery measures at the upper limits of normal: 3.2 cm. Mediastinum/Nodes: No enlarged mediastinal, hilar, or axillary lymph nodes. Thyroid gland, trachea, and esophagus demonstrate no significant findings. Lungs/Pleura: Right lower lobe atelectasis with persistent elevated right hemidiaphragm. Limited evaluation for pulmonary nodule due to respiratory motion artifact. No pulmonary mass. No focal consolidation. No pleural effusion. No pneumothorax. Musculoskeletal: No chest wall abnormality. No suspicious lytic or blastic osseous lesions. No acute displaced fracture. Multilevel degenerative changes of the spine. CT ABDOMEN PELVIS FINDINGS Hepatobiliary: No focal liver abnormality.  Gallstone noted within the gallbladder lumen. No gallbladder wall thickening or pericholecystic fluid. No biliary dilatation. Pancreas: No focal lesion. Normal pancreatic contour. No surrounding inflammatory changes. No main pancreatic ductal dilatation. Spleen: Normal in size without focal abnormality. Adrenals/Urinary Tract: No adrenal nodule bilaterally. Bilateral kidneys enhance symmetrically. Exophytic hypodense 2.2 cm lesion within the right kidney likely represent simple  renal cysts. Slightly increased density likely due to streak artifact at this level. No hydronephrosis. No hydroureter. The urinary bladder is unremarkable. On delayed imaging, there is no urothelial wall thickening and there are no filling defects in the opacified portions of the bilateral collecting systems or ureters. Stomach/Bowel: Stomach is within normal limits. No evidence of bowel wall thickening or dilatation. 8 cm stool ball within the rectum. The appendix not definitely identified. Vascular/Lymphatic: No abdominal aorta or iliac aneurysm. Mild atherosclerotic plaque of the aorta and its branches. No abdominal, pelvic, or inguinal lymphadenopathy. Reproductive: Difficult to measure grossly stable uterine fibroids. No adnexal lesion. Other: No intraperitoneal free fluid. No intraperitoneal free gas. No organized fluid collection. Musculoskeletal: Tiny fat containing left inguinal hernia. No suspicious lytic or blastic osseous lesions. No acute displaced fracture. Multilevel degenerative changes of the spine. IMPRESSION: 1. Main pulmonary artery measures at the upper limits of normal. Correlate with pulmonary hypertension. 2. Otherwise no acute intrathoracic abnormality. 3. Cholelithiasis with no CT findings of acute cholecystitis. 4. An 8 cm stool ball within the rectum with no associated findings suggest of stercoral colitis. 5. 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 Electronically Signed   By: Iven Finn M.D.   On: 11/17/2020 20:07   MR BRAIN W WO CONTRAST  Result Date: 11/19/2020 CLINICAL DATA:  Mental status change.  Encephalopathy EXAM: MRI HEAD WITHOUT AND WITH CONTRAST TECHNIQUE: Multiplanar, multiecho pulse sequences of the brain and surrounding structures were obtained without and with intravenous contrast. CONTRAST:  7.7m GADAVIST GADOBUTROL 1 MMOL/ML IV SOLN COMPARISON:  CT head 11/17/2020.  MRI head 12/24/2019 FINDINGS: Brain: Patchy areas of hyperintensity in the left occipital pole  extending into the left posterior parietal lobe best seen on FLAIR. This involves primarily the white matter but some areas of cortex. This area does not show restricted diffusion or susceptibility. This area was normal on the prior MRI 2021. No abnormal enhancement. Moderate atrophy. Mild white matter changes consistent with chronic microvascular ischemia. Diffusion-weighted imaging negative for acute infarct. No hemorrhage or mass lesion. Vascular: Normal arterial flow voids Skull and upper cervical spine: Negative Sinuses/Orbits: Mild mucosal edema paranasal sinuses. Negative orbit Other: None IMPRESSION: Ill-defined patchy hyperintensity in the left occipital parietal lobe without restricted diffusion or abnormal enhancement. Differential diagnosis includes late subacute infarct, encephalitis. Tumor not considered likely. Correlate with symptoms. Electronically Signed   By: CFranchot GalloM.D.   On: 11/19/2020 15:24   CT Abdomen Pelvis W Contrast  Result Date: 11/17/2020 CLINICAL DATA:  Fever, altered mental status.  Nausea and vomiting. EXAM: CT CHEST, ABDOMEN, AND PELVIS WITH CONTRAST TECHNIQUE: Multidetector CT imaging of the chest, abdomen and pelvis was performed following the standard protocol during bolus administration of intravenous contrast. CONTRAST:  1028mOMNIPAQUE IOHEXOL 300 MG/ML  SOLN COMPARISON:  CT abdomen pelvis 12/20/2019, CT angiography chest 12/24/2019 FINDINGS: CT CHEST FINDINGS Cardiovascular: Normal heart size. No significant pericardial effusion. The thoracic aorta is normal in caliber. Mild atherosclerotic plaque of the thoracic aorta. The main pulmonary artery measures at the upper limits of normal: 3.2 cm. Mediastinum/Nodes: No enlarged mediastinal, hilar, or axillary lymph nodes. Thyroid gland,  trachea, and esophagus demonstrate no significant findings. Lungs/Pleura: Right lower lobe atelectasis with persistent elevated right hemidiaphragm. Limited evaluation for pulmonary  nodule due to respiratory motion artifact. No pulmonary mass. No focal consolidation. No pleural effusion. No pneumothorax. Musculoskeletal: No chest wall abnormality. No suspicious lytic or blastic osseous lesions. No acute displaced fracture. Multilevel degenerative changes of the spine. CT ABDOMEN PELVIS FINDINGS Hepatobiliary: No focal liver abnormality. Gallstone noted within the gallbladder lumen. No gallbladder wall thickening or pericholecystic fluid. No biliary dilatation. Pancreas: No focal lesion. Normal pancreatic contour. No surrounding inflammatory changes. No main pancreatic ductal dilatation. Spleen: Normal in size without focal abnormality. Adrenals/Urinary Tract: No adrenal nodule bilaterally. Bilateral kidneys enhance symmetrically. Exophytic hypodense 2.2 cm lesion within the right kidney likely represent simple renal cysts. Slightly increased density likely due to streak artifact at this level. No hydronephrosis. No hydroureter. The urinary bladder is unremarkable. On delayed imaging, there is no urothelial wall thickening and there are no filling defects in the opacified portions of the bilateral collecting systems or ureters. Stomach/Bowel: Stomach is within normal limits. No evidence of bowel wall thickening or dilatation. 8 cm stool ball within the rectum. The appendix not definitely identified. Vascular/Lymphatic: No abdominal aorta or iliac aneurysm. Mild atherosclerotic plaque of the aorta and its branches. No abdominal, pelvic, or inguinal lymphadenopathy. Reproductive: Difficult to measure grossly stable uterine fibroids. No adnexal lesion. Other: No intraperitoneal free fluid. No intraperitoneal free gas. No organized fluid collection. Musculoskeletal: Tiny fat containing left inguinal hernia. No suspicious lytic or blastic osseous lesions. No acute displaced fracture. Multilevel degenerative changes of the spine. IMPRESSION: 1. Main pulmonary artery measures at the upper limits of  normal. Correlate with pulmonary hypertension. 2. Otherwise no acute intrathoracic abnormality. 3. Cholelithiasis with no CT findings of acute cholecystitis. 4. An 8 cm stool ball within the rectum with no associated findings suggest of stercoral colitis. 5. 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 Electronically Signed   By: Iven Finn M.D.   On: 11/17/2020 20:07   EEG adult  Result Date: 11/21/2020 Plancher, Baron Sane, MD     11/21/2020  3:50 PM PROCEDURE: EEG routine 22 minutes with video study  DATES OF TEST: 11/21/2020  REASON FOR TEST: acute encephalopathy  AED/Sedative MEDICATIONS: n/a  TECHNIQUE: This is an 18 channel digital EEG recording using the standard international 10/20 system of electrode placement with one channel EKG recording. Pt was awake during this recording.  FINDINGS: Background rhythm: symmetric diffuse 4-8 Hz, mostly myogenic artifact. Amplitude :  low Continuity: continuous Breach effect:  NO Variability: YES Reactivity: YES Rhythmic delta activity: NO Periodic discharges: NO Sporadic epileptiform discharges: NO Electrographic/electroclinical seizures: NO  Limited EKG reveals no abnormalities.   IMPRESSION: This is an abnormal study due to - 1. Moderate to mild Generalized slowing is a non-specific finding consistent with a generalized disturbance of cerebral functioning including toxic, metabolic, or structural abnormalities that are multi-focal or diffuse. 2. No definite epileptiform discharges or electrographic seizures noted.    ECHOCARDIOGRAM COMPLETE  Result Date: 11/19/2020    ECHOCARDIOGRAM REPORT   Patient Name:   KALYSTA KNEISLEY Date of Exam: 11/19/2020 Medical Rec #:  [id number]      Height:       66.0 in Accession #:    [id number]     Weight:       195.2 lb Date of Birth:  03-Oct-1945       BSA:          1.979 m Patient  Age:    15 years       BP:           145/76 mmHg Patient Gender: F              HR:           75 bpm. Exam Location:  Inpatient Procedure: 2D Echo  Indications:    bacteremia  History:        Patient has prior history of Echocardiogram examinations, most                 recent 12/23/2019. CHF, Arrythmias:Atrial Fibrillation,                 Signs/Symptoms:Bacteremia; Risk Factors:Diabetes.  Sonographer:    Johny Chess Referring Phys: [id number] HALPF LATIF [facility]  Sonographer Comments: Image acquisition challenging due to uncooperative patient and Image acquisition challenging due to patient body habitus. IMPRESSIONS  1. Limited study due to poor acoustic windows. Left ventricular ejection fraction, by estimation, is 60 to 65%. The left ventricle has normal function. The left ventricle has no regional wall motion abnormalities. There is mild concentric left ventricular hypertrophy. Left ventricular diastolic parameters are consistent with Grade I diastolic dysfunction (impaired relaxation).  2. Right ventricular systolic function is mildly reduced. The right ventricular size is mildly-to-moderately enlarged.  3. The mitral valve is normal in structure. Trivial mitral valve regurgitation. No evidence of mitral stenosis.  4. The aortic valve is tricuspid. There is mild calcification of the aortic valve. There is mild thickening of the aortic valve. Aortic valve regurgitation is not visualized. Mild aortic valve sclerosis is present, with no evidence of aortic valve stenosis.  5. No valvular vegetations visualized on surface echo. Pending clinical suspicion for infective endocarditis, can consider TEE for further evaluation. Comparison(s): Compared to prior TTE on 79/02/40, the RV systolic function appears mildly improved. Otherwise there is no significant change. Conclusion(s)/Recommendation(s): No evidence of valvular vegetations on this transthoracic echocardiogram. Would recommend a transesophageal echocardiogram to exclude infective endocarditis if clinically indicated. FINDINGS  Left Ventricle: Limited study due to poor acoustic windows. Left ventricular  ejection fraction, by estimation, is 60 to 65%. The left ventricle has normal function. The left ventricle has no regional wall motion abnormalities. Definity contrast agent was given IV to delineate the left ventricular endocardial borders. The left ventricular internal cavity size was normal in size. There is mild concentric left ventricular hypertrophy. Left ventricular diastolic parameters are consistent with Grade I diastolic dysfunction (impaired relaxation). Right Ventricle: The right ventricular size is mildly-to-moderately enlarged. Right vetricular wall thickness was not well visualized. Right ventricular systolic function is mildly reduced. Left Atrium: Left atrial size was normal in size. Right Atrium: Right atrial size was normal in size. Pericardium: There is no evidence of pericardial effusion. Mitral Valve: The mitral valve is normal in structure. There is mild thickening of the mitral valve leaflet(s). There is mild calcification of the mitral valve leaflet(s). Trivial mitral valve regurgitation. No evidence of mitral valve stenosis. Tricuspid Valve: The tricuspid valve is normal in structure. Tricuspid valve regurgitation is trivial. Aortic Valve: The aortic valve is tricuspid. There is mild calcification of the aortic valve. There is mild thickening of the aortic valve. Aortic valve regurgitation is not visualized. Mild aortic valve sclerosis is present, with no evidence of aortic valve stenosis. Pulmonic Valve: The pulmonic valve was not well visualized. Pulmonic valve regurgitation is not visualized. Aorta: The aortic root and ascending aorta are structurally normal, with no  evidence of dilitation. IAS/Shunts: No atrial level shunt detected by color flow Doppler.  LEFT VENTRICLE PLAX 2D LVIDd:         4.26 cm LVIDs:         2.71 cm LV PW:         1.17 cm LV IVS:        1.06 cm LVOT diam:     1.80 cm LVOT Area:     2.54 cm  RIGHT VENTRICLE RV S prime:     9.90 cm/s LEFT ATRIUM         Index LA  diam:    3.80 cm 1.92 cm/m   AORTA Ao Root diam: 2.90 cm Ao Asc diam:  3.40 cm  SHUNTS Systemic Diam: 1.80 cm Gwyndolyn Kaufman MD Electronically signed by Gwyndolyn Kaufman MD Signature Date/Time: 11/19/2020/5:00:20 PM    Final    DG FL GUIDED LUMBAR PUNCTURE  Result Date: 11/18/2020 CLINICAL DATA:  Encephalopathy.  Fever.  Rule out meningitis EXAM: DIAGNOSTIC LUMBAR PUNCTURE UNDER FLUOROSCOPIC GUIDANCE COMPARISON:  CT abdomen pelvis 11/17/2020 FLUOROSCOPY TIME:  Fluoroscopy Time:  0 minutes 48 seconds Radiation Exposure Index (if provided by the fluoroscopic device): Number of Acquired Spot Images: 1 PROCEDURE: Patient was sedated prior to the study. The patient was uncooperative. Informed consent was obtained from the patient's brother by telephone prior to the procedure, including potential complications of headache, allergy, and pain. With the patient prone, the lower back was prepped with Betadine. 1% Lidocaine was used for local anesthesia. Lumbar puncture was performed at the L2-3 level using a 20 gauge needle with return of clear CSF with an opening pressure of 13 cm water. Six ml of CSF were obtained for laboratory studies. The patient tolerated the procedure well and there were no apparent complications. IMPRESSION: Successful lumbar puncture.  Clear CSF. Electronically Signed   By: Franchot Gallo M.D.   On: 11/18/2020 14:20

## 2020-11-23 NOTE — Plan of Care (Signed)
  Problem: Coping: Goal: Level of anxiety will decrease Outcome: Progressing   Problem: Safety: Goal: Ability to remain free from injury will improve Outcome: Progressing   Problem: Nutrition: Goal: Adequate nutrition will be maintained Outcome: Progressing   Problem: Health Behavior/Discharge Planning: Goal: Ability to manage health-related needs will improve Outcome: Not Progressing   Problem: Activity: Goal: Risk for activity intolerance will decrease Outcome: Not Progressing

## 2020-11-23 NOTE — Progress Notes (Signed)
Received consult for PIV placement. Patient has been stuck multiple times, 5 PIVs in 5 days. This patient continues to pull out PIVs even with sitter present. Patient is aggressive and very resistant to PIV placement, has very limited vasculature. Notified nurse. Awaiting f/u as needed. Tomasita Morrow, RN VAST

## 2020-11-23 NOTE — NC FL2 (Signed)
[place] MEDICAID FL2 LEVEL OF CARE SCREENING TOOL     IDENTIFICATION  Patient Name: Becky Hendricks Birthdate: May 21, 1946 Sex: female Admission Date (Current Location): 11/17/2020  [facility] and [place] Number:  Producer, television/film/video and Address:  [facility] [place], [address] [place], [place] [address]      Provider Number: [id number]  Attending Physician Name and Address:  Leroy Sea, MD  Relative Name and Phone Number:       Current Level of Care: Hospital Recommended Level of Care: Skilled Nursing Facility Prior Approval Number:    Date Approved/Denied:   PASRR Number: [id number]  Discharge Plan: SNF    Current Diagnoses: Patient Active Problem List   Diagnosis Date Noted  . Bipolar affective disorder, currently manic, mild (HCC) 11/19/2020  . Constipation 11/18/2020  . Encephalopathy 11/18/2020  . MRSA bacteremia   . SIRS (systemic inflammatory response syndrome) (HCC) 11/17/2020  . Nausea and vomiting 11/17/2020  . Mixed diabetic hyperlipidemia associated with type 2 diabetes mellitus (HCC) 11/17/2020  . Type 2 diabetes mellitus with hyperglycemia, with long-term current use of insulin (HCC) 11/17/2020  . Immunization due 07/26/2020  . Psychosis (HCC)   . Bipolar disorder (HCC) 04/27/2020  . Fall 02/11/2020  . Chronic diastolic CHF (congestive heart failure) (HCC) 01/22/2020  . Drug rash   . Acute osteomyelitis of pelvic region and thigh (HCC)   . Palliative care by specialist   . Goals of care, counseling/discussion   . Edema of upper extremity   . Hyperphosphatemia   . Cardiac arrest (HCC)   . Sacral wound   . Hypoxia   . Severe sepsis (HCC) 12/03/2019  . Abscess of multiple sites of buttock 12/03/2019  . AF (paroxysmal atrial fibrillation) (HCC) 12/03/2019  . Acute metabolic encephalopathy 09/15/2019  . UTI (urinary tract infection), bacterial 09/15/2019  . Pressure injury of skin 09/10/2019  . Morbid obesity with BMI of  40.0-44.9, adult (HCC) 02/27/2018    Orientation RESPIRATION BLADDER Height & Weight     Self,Place  Normal Incontinent Weight: 194 lb 0.1 oz (88 kg) Height:     BEHAVIORAL SYMPTOMS/MOOD NEUROLOGICAL BOWEL NUTRITION STATUS      Continent Diet (See DC Summary)  AMBULATORY STATUS COMMUNICATION OF NEEDS Skin   Total Care Verbally Normal                       Personal Care Assistance Level of Assistance  Bathing,Feeding,Dressing Bathing Assistance: Maximum assistance Feeding assistance: Limited assistance Dressing Assistance: Maximum assistance     Functional Limitations Info  Sight,Hearing,Speech Sight Info: Adequate Hearing Info: Adequate Speech Info: Adequate    SPECIAL CARE FACTORS FREQUENCY  PT (By licensed PT),OT (By licensed OT)     PT Frequency: 5x/week OT Frequency: 5x/week            Contractures Contractures Info: Not present    Additional Factors Info  Code Status,Allergies Code Status Info: Full Allergies Info: Chlorhexidine, Gluconate           Current Medications (11/23/2020):  This is the current hospital active medication list Current Facility-Administered Medications  Medication Dose Route Frequency Provider Last Rate Last Admin  . acetaminophen (TYLENOL) tablet 650 mg  650 mg Oral Q6H PRN Shalhoub, Deno Lunger, MD       Or  . acetaminophen (TYLENOL) suppository 650 mg  650 mg Rectal Q6H PRN Shalhoub, Deno Lunger, MD      . amLODipine (NORVASC) tablet 5 mg  5 mg Oral Daily Leroy Sea, MD   5 mg at 11/23/20 1002  . apixaban (ELIQUIS) tablet 5 mg  5 mg Oral BID Marguerita Merles [place], DO   5 mg at 11/23/20 1001  . atorvastatin (LIPITOR) tablet 10 mg  10 mg Oral Daily Leroy Sea, MD   10 mg at 11/23/20 1001  . cloNIDine (CATAPRES - Dosed in mg/24 hr) patch 0.2 mg  0.2 mg Transdermal Weekly Leroy Sea, MD   0.2 mg at 11/20/20 1810  . DAPTOmycin (CUBICIN) 700 mg in sodium chloride 0.9 % IVPB  8 mg/kg Intravenous Q2000 Mosetta Anis, [facility]   Stopped at 11/22/20 2135  . docusate sodium (COLACE) capsule 200 mg  200 mg Oral BID Susa Raring K, MD   200 mg at 11/23/20 1001  . haloperidol (HALDOL) tablet 5 mg  5 mg Oral BID Maryagnes Amos, FNP   5 mg at 11/23/20 1001   Or  . haloperidol lactate (HALDOL) injection 5 mg  5 mg Intravenous BID Maryagnes Amos, FNP   5 mg at 11/22/20 2100  . haloperidol lactate (HALDOL) injection 5 mg  5 mg Intravenous Q12H PRN Maryagnes Amos, FNP   5 mg at 11/21/20 1314  . hydrALAZINE (APRESOLINE) injection 10 mg  10 mg Intravenous Q6H PRN Leroy Sea, MD   10 mg at 11/21/20 1220  . insulin aspart (novoLOG) injection 0-15 Units  0-15 Units Subcutaneous Q6H Shalhoub, Deno Lunger, MD   3 Units at 11/23/20 0110  . LORazepam (ATIVAN) injection 1 mg  1 mg Intravenous Q8H PRN Leroy Sea, MD      . multivitamin with minerals tablet 1 tablet  1 tablet Oral Daily Leroy Sea, MD   1 tablet at 11/23/20 1001  . ondansetron (ZOFRAN) injection 4 mg  4 mg Intravenous Q6H PRN Shalhoub, Deno Lunger, MD      . pantoprazole (PROTONIX) EC tablet 40 mg  40 mg Oral Daily Leroy Sea, MD   40 mg at 11/23/20 1002  . polyethylene glycol (MIRALAX / GLYCOLAX) packet 17 g  17 g Oral BID Leroy Sea, MD   17 g at 11/23/20 1002  . QUEtiapine (SEROQUEL) tablet 12.5 mg  12.5 mg Oral BID PRN Charm Rings, NP   12.5 mg at 11/22/20 2240  . valproate (DEPACON) 500 mg in dextrose 5 % 50 mL IVPB  500 mg Intravenous Q12H Leroy Sea, MD 55 mL/hr at 11/23/20 0529 500 mg at 11/23/20 1884     Discharge Medications: Please see discharge summary for a list of discharge medications.  Relevant Imaging Results:  Relevant Lab Results:   Additional Information SSN: [id number]  Chana Bode, Student-Social Work

## 2020-11-23 NOTE — Progress Notes (Signed)
PHARMACY CONSULT NOTE FOR:  OUTPATIENT  PARENTERAL ANTIBIOTIC THERAPY (OPAT)  Indication: MRSA bacteremia Regimen: Daptomycin 700mg  IV every 24 hours End date: 12/16/20  IV antibiotic discharge orders are pended. To discharging provider:  please sign these orders via discharge navigator,  Select New Orders & click on the button choice - Manage This Unsigned Work.    Thank you for allowing pharmacy to be a part of this patient's care.  02/15/21, PharmD PGY1 Acute Care Pharmacy Resident

## 2020-11-24 DIAGNOSIS — F3111 Bipolar disorder, current episode manic without psychotic features, mild: Secondary | ICD-10-CM | POA: Diagnosis not present

## 2020-11-24 DIAGNOSIS — B9562 Methicillin resistant Staphylococcus aureus infection as the cause of diseases classified elsewhere: Secondary | ICD-10-CM | POA: Diagnosis not present

## 2020-11-24 DIAGNOSIS — R7881 Bacteremia: Secondary | ICD-10-CM | POA: Diagnosis not present

## 2020-11-24 LAB — GLUCOSE, CAPILLARY
Glucose-Capillary: 110 mg/dL — ABNORMAL HIGH (ref 70–99)
Glucose-Capillary: 120 mg/dL — ABNORMAL HIGH (ref 70–99)
Glucose-Capillary: 138 mg/dL — ABNORMAL HIGH (ref 70–99)
Glucose-Capillary: 143 mg/dL — ABNORMAL HIGH (ref 70–99)
Glucose-Capillary: 167 mg/dL — ABNORMAL HIGH (ref 70–99)

## 2020-11-24 LAB — CBC WITH DIFFERENTIAL/PLATELET
Abs Immature Granulocytes: 0.03 10*3/uL (ref 0.00–0.07)
Basophils Absolute: 0 10*3/uL (ref 0.0–0.1)
Basophils Relative: 0 %
Eosinophils Absolute: 0.5 10*3/uL (ref 0.0–0.5)
Eosinophils Relative: 6 %
HCT: 35.2 % — ABNORMAL LOW (ref 36.0–46.0)
Hemoglobin: 11 g/dL — ABNORMAL LOW (ref 12.0–15.0)
Immature Granulocytes: 0 %
Lymphocytes Relative: 40 %
Lymphs Abs: 3.1 10*3/uL (ref 0.7–4.0)
MCH: 28.9 pg (ref 26.0–34.0)
MCHC: 31.3 g/dL (ref 30.0–36.0)
MCV: 92.4 fL (ref 80.0–100.0)
Monocytes Absolute: 1 10*3/uL (ref 0.1–1.0)
Monocytes Relative: 13 %
Neutro Abs: 3.3 10*3/uL (ref 1.7–7.7)
Neutrophils Relative %: 41 %
Platelets: 226 10*3/uL (ref 150–400)
RBC: 3.81 MIL/uL — ABNORMAL LOW (ref 3.87–5.11)
RDW: 15.2 % (ref 11.5–15.5)
WBC: 7.9 10*3/uL (ref 4.0–10.5)
nRBC: 0 % (ref 0.0–0.2)

## 2020-11-24 LAB — HSV(HERPES SMPLX VRS)ABS-I+II(IGG)-CSF: HSV Type I/II Ab, IgG CSF: 1.98 IV — ABNORMAL HIGH (ref <=0.89)

## 2020-11-24 LAB — COMPREHENSIVE METABOLIC PANEL
ALT: 16 U/L (ref 0–44)
AST: 11 U/L — ABNORMAL LOW (ref 15–41)
Albumin: 2.4 g/dL — ABNORMAL LOW (ref 3.5–5.0)
Alkaline Phosphatase: 41 U/L (ref 38–126)
Anion gap: 6 (ref 5–15)
BUN: 11 mg/dL (ref 8–23)
CO2: 32 mmol/L (ref 22–32)
Calcium: 8.7 mg/dL — ABNORMAL LOW (ref 8.9–10.3)
Chloride: 104 mmol/L (ref 98–111)
Creatinine, Ser: 0.72 mg/dL (ref 0.44–1.00)
GFR, Estimated: >60 mL/min (ref >60)
Glucose, Bld: 103 mg/dL — ABNORMAL HIGH (ref 70–99)
Potassium: 4.1 mmol/L (ref 3.5–5.1)
Sodium: 142 mmol/L (ref 135–145)
Total Bilirubin: 0.8 mg/dL (ref 0.3–1.2)
Total Protein: 5 g/dL — ABNORMAL LOW (ref 6.5–8.1)

## 2020-11-24 LAB — MAGNESIUM: Magnesium: 2 mg/dL (ref 1.7–2.4)

## 2020-11-24 LAB — CULTURE, BLOOD (ROUTINE X 2)
Culture: NO GROWTH
Culture: NO GROWTH
Special Requests: ADEQUATE
Special Requests: ADEQUATE

## 2020-11-24 LAB — PROCALCITONIN: Procalcitonin: <0.1 ng/mL

## 2020-11-24 LAB — BRAIN NATRIURETIC PEPTIDE: B Natriuretic Peptide: 21.4 pg/mL (ref 0.0–100.0)

## 2020-11-24 LAB — C-REACTIVE PROTEIN: CRP: 1.6 mg/dL — ABNORMAL HIGH (ref <1.0)

## 2020-11-24 MED ORDER — DIVALPROEX SODIUM 250 MG PO DR TAB
500.0000 mg | DELAYED_RELEASE_TABLET | Freq: Two times a day (BID) | ORAL | Status: DC
  Administered 2020-11-24 – 2020-12-02 (×17): 500 mg via ORAL
  Filled 2020-11-24 (×18): qty 2

## 2020-11-24 MED ORDER — TAMSULOSIN HCL 0.4 MG PO CAPS
0.4000 mg | ORAL_CAPSULE | Freq: Every day | ORAL | Status: DC
  Administered 2020-11-24 – 2020-12-02 (×9): 0.4 mg via ORAL
  Filled 2020-11-24 (×9): qty 1
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
## 2020-11-24 NOTE — Progress Notes (Signed)
PHARMACY CONSULT NOTE FOR:  OUTPATIENT  PARENTERAL ANTIBIOTIC THERAPY (OPAT)  Indication: MRSA bacteremia Regimen: Daptomycin 700mg  IV every 24 hours End date: 12/20/20  Adjusting end date to align with ID office appointment  IV antibiotic discharge orders are pended. To discharging provider:  please sign these orders via discharge navigator,  Select New Orders & click on the button choice - Manage This Unsigned Work.    Thank you for allowing pharmacy to be a part of this patient's care.  02/19/21, PharmD, BCPS, BCIDP Infectious Diseases Clinical Pharmacist Phone: [phone] 11/24/2020 1:46 PM

## 2020-11-24 NOTE — Progress Notes (Signed)
Subjective:  "I want food"  Antibiotics:  Anti-infectives (From admission, onward)   Start     Dose/Rate Route Frequency Ordered Stop   11/20/20 2000  DAPTOmycin (CUBICIN) 700 mg in sodium chloride 0.9 % IVPB        8 mg/kg  88 kg 128 mL/hr over 30 Minutes Intravenous Daily 11/20/20 1657 12/16/20 2359   11/18/20 2200  vancomycin (VANCOREADY) IVPB 1250 mg/250 mL  Status:  Discontinued        1,250 mg 166.7 mL/hr over 90 Minutes Intravenous Every 24 hours 11/17/20 2345 11/20/20 1644   11/18/20 0000  ampicillin (OMNIPEN) 2 g in sodium chloride 0.9 % 100 mL IVPB  Status:  Discontinued        2 g 300 mL/hr over 20 Minutes Intravenous Every 4 hours 11/17/20 2319 11/18/20 1520   11/17/20 2345  vancomycin (VANCOCIN) IVPB 1000 mg/200 mL premix        1,000 mg 200 mL/hr over 60 Minutes Intravenous  Once 11/17/20 2331 11/18/20 0155   11/17/20 2330  cefTRIAXone (ROCEPHIN) 2 g in sodium chloride 0.9 % 100 mL IVPB  Status:  Discontinued        2 g 200 mL/hr over 30 Minutes Intravenous Every 12 hours 11/17/20 2319 11/18/20 1520      Medications: Scheduled Meds: . amLODipine  5 mg Oral Daily  . apixaban  5 mg Oral BID  . atorvastatin  10 mg Oral Daily  . cloNIDine  0.2 mg Transdermal Weekly  . divalproex  500 mg Oral Q12H  . docusate sodium  200 mg Oral BID  . haloperidol  10 mg Oral TID   Or  . haloperidol lactate  10 mg Intravenous TID  . insulin aspart  0-15 Units Subcutaneous Q6H  . multivitamin with minerals  1 tablet Oral Daily  . pantoprazole  40 mg Oral Daily  . polyethylene glycol  17 g Oral BID  . tamsulosin  0.4 mg Oral Daily   Continuous Infusions: . DAPTOmycin (CUBICIN)  IV Stopped (11/23/20 2027)   PRN Meds:.acetaminophen **OR** acetaminophen, haloperidol lactate, hydrALAZINE, LORazepam, [DISCONTINUED] ondansetron **OR** ondansetron (ZOFRAN) IV, QUEtiapine    Objective: Weight change:   Intake/Output Summary (Last 24 hours) at 11/24/2020 1509 Last data  filed at 11/24/2020 1300 Gross per 24 hour  Intake 1080 ml  Output 1350 ml  Net -270 ml   Blood pressure 128/65, pulse 89, temperature 98.5 F (36.9 C), temperature source Oral, resp. rate 20, weight 88 kg, SpO2 100 %. Temp:  [97.7 F (36.5 C)-98.6 F (37 C)] 98.5 F (36.9 C) (04/14 0821) Pulse Rate:  [65-89] 89 (04/14 0821) Resp:  [15-20] 20 (04/14 0821) BP: (113-147)/(58-74) 128/65 (04/14 0821) SpO2:  [95 %-100 %] 100 % (04/14 7510)  Physical Exam: Physical Exam Constitutional:      General: She is not in acute distress.    Appearance: She is well-developed. She is not diaphoretic.  HENT:     Head: Normocephalic and atraumatic.     Right Ear: External ear normal.     Left Ear: External ear normal.     Mouth/Throat:     Pharynx: No oropharyngeal exudate.  Eyes:     General: No scleral icterus.    Extraocular Movements: Extraocular movements intact.     Conjunctiva/sclera: Conjunctivae normal.  Cardiovascular:     Rate and Rhythm: Normal rate and regular rhythm.  Pulmonary:     Effort: Pulmonary effort is normal. No respiratory  distress.     Breath sounds: Normal breath sounds. No wheezing.  Abdominal:     General: Bowel sounds are normal. There is no distension.     Palpations: Abdomen is soft.  Musculoskeletal:        General: No tenderness. Normal range of motion.  Lymphadenopathy:     Cervical: No cervical adenopathy.  Skin:    General: Skin is warm and dry.     Coloration: Skin is not pale.     Findings: No erythema or rash.  Neurological:     General: No focal deficit present.     Mental Status: She is alert.     Motor: No abnormal muscle tone.     Coordination: Coordination normal.  Psychiatric:        Behavior: Behavior is cooperative.        Thought Content: Thought content is delusional.        Cognition and Memory: Cognition is impaired. Memory is impaired. She exhibits impaired recent memory.      CBC:    BMET Recent Labs    11/23/20 0528  11/24/20 0238  NA 143 142  K 4.1 4.1  CL 108 104  CO2 29 32  GLUCOSE 109* 103*  BUN 9 11  CREATININE 0.58 0.72  CALCIUM 8.9 8.7*     Liver Panel  Recent Labs    11/23/20 0528 11/24/20 0238  PROT 5.2* 5.0*  ALBUMIN 2.6* 2.4*  AST 12* 11*  ALT 16 16  ALKPHOS 38 41  BILITOT 0.8 0.8       Sedimentation Rate No results for input(s): ESRSEDRATE in the last 72 hours. C-Reactive Protein Recent Labs    11/23/20 0528 11/24/20 0238  CRP 1.6* 1.6*    Micro Results: Recent Results (from the past 720 hour(s))  Urine culture     Status: None   Collection Time: 11/17/20  5:14 PM   Specimen: Urine, Random  Result Value Ref Range Status   Specimen Description   Final    URINE, RANDOM Performed at [facility] [address], [place], [place] [address]    Special Requests   Final    NONE Performed at [facility], [place] [address], [place], [place] [address]    Culture   Final    NO GROWTH Performed at [facility], [place] [address] [place], [place] [address]    Report Status 11/19/2020 FINAL  Final  Culture, blood (routine x 2)     Status: None   Collection Time: 11/17/20  6:42 PM   Specimen: BLOOD  Result Value Ref Range Status   Specimen Description   Final    BLOOD LEFT ANTECUBITAL Performed at [facility] [address], [place], [place] [address]    Special Requests   Final    BOTTLES DRAWN AEROBIC AND ANAEROBIC Blood Culture adequate volume Performed at [facility] [address], [place],  [address]    Culture   Final    NO GROWTH 5 DAYS Performed at [facility], [place] [address] [place],  [address]    Report Status 11/22/2020 FINAL  Final  Culture, blood (routine x 2)     Status: Abnormal   Collection Time: 11/17/20  6:45 PM   Specimen: BLOOD LEFT FOREARM  Result Value Ref Range Status   Specimen Description   Final    BLOOD LEFT  FOREARM Performed at [facility], [address] [place], [place] [address]    Special Requests   Final    BOTTLES DRAWN AEROBIC AND ANAEROBIC Blood Culture adequate volume Performed at [facility] [address] [place] [address] [place], [place] [address]    Culture  Setup Time   Final    GRAM POSITIVE COCCI IN CLUSTERS AEROBIC BOTTLE ONLY CRITICAL RESULT CALLED TO, READ BACK BY AND VERIFIED WITH: [place] ON 11/18/20 AT 1508 BY KJ Performed at [facility], [place] [address] [place], [place] [address]    Culture METHICILLIN RESISTANT STAPHYLOCOCCUS AUREUS (A)  Final   Report Status 11/20/2020 FINAL  Final   Organism ID, Bacteria METHICILLIN RESISTANT STAPHYLOCOCCUS AUREUS  Final      Susceptibility   Methicillin resistant staphylococcus aureus - MIC*    CIPROFLOXACIN 2 INTERMEDIATE Intermediate     ERYTHROMYCIN >=8 RESISTANT Resistant     GENTAMICIN <=0.5 SENSITIVE Sensitive     OXACILLIN >=4 RESISTANT Resistant     TETRACYCLINE <=1 SENSITIVE Sensitive     VANCOMYCIN <=0.5 SENSITIVE Sensitive     TRIMETH/SULFA <=10 SENSITIVE Sensitive     CLINDAMYCIN <=0.25 SENSITIVE Sensitive     RIFAMPIN <=0.5 SENSITIVE Sensitive     Inducible Clindamycin NEGATIVE Sensitive     * METHICILLIN RESISTANT STAPHYLOCOCCUS AUREUS  Blood Culture ID Panel (Reflexed)     Status: Abnormal   Collection Time: 11/17/20  6:45 PM  Result Value Ref Range Status   Enterococcus faecalis NOT DETECTED NOT DETECTED Final   Enterococcus Faecium NOT DETECTED NOT DETECTED Final   Listeria monocytogenes NOT DETECTED NOT DETECTED Final   Staphylococcus species DETECTED (A) NOT DETECTED Final    Comment: CRITICAL RESULT CALLED TO, READ BACK BY AND VERIFIED WITH: PHARMD CHRISTINE SHADE ON 11/18/20 AT 1508 BY KJ    Staphylococcus aureus (BCID) DETECTED (A) NOT DETECTED Final    Comment: Methicillin (oxacillin)-resistant Staphylococcus aureus (MRSA). MRSA is predictably  resistant to beta-lactam antibiotics (except ceftaroline). Preferred therapy is vancomycin unless clinically contraindicated. Patient requires contact precautions if  hospitalized. CRITICAL RESULT CALLED TO, READ BACK BY AND VERIFIED WITH: [place] ON 11/18/20 AT 1508 BY KJ    Staphylococcus epidermidis NOT DETECTED NOT DETECTED Final   Staphylococcus lugdunensis NOT DETECTED NOT DETECTED Final   Streptococcus species NOT DETECTED NOT DETECTED Final   Streptococcus agalactiae NOT DETECTED NOT DETECTED Final   Streptococcus pneumoniae NOT DETECTED NOT DETECTED Final   Streptococcus pyogenes NOT DETECTED NOT DETECTED Final   A.calcoaceticus-baumannii NOT DETECTED NOT DETECTED Final   Bacteroides fragilis NOT DETECTED NOT DETECTED Final   Enterobacterales NOT DETECTED NOT DETECTED Final   Enterobacter cloacae complex NOT DETECTED NOT DETECTED Final   Escherichia coli NOT DETECTED NOT DETECTED Final   Klebsiella aerogenes NOT DETECTED NOT DETECTED Final   Klebsiella oxytoca NOT DETECTED NOT DETECTED Final   Klebsiella pneumoniae NOT DETECTED NOT DETECTED Final   Proteus species NOT DETECTED NOT DETECTED Final   Salmonella species NOT DETECTED NOT DETECTED Final   Serratia marcescens NOT DETECTED NOT DETECTED Final   Haemophilus influenzae NOT DETECTED NOT DETECTED Final   Neisseria meningitidis NOT DETECTED NOT DETECTED Final   Pseudomonas aeruginosa NOT DETECTED NOT DETECTED Final   Stenotrophomonas maltophilia NOT DETECTED NOT DETECTED Final   Candida albicans NOT DETECTED NOT DETECTED Final   Candida auris NOT DETECTED NOT DETECTED Final   Candida glabrata NOT DETECTED NOT DETECTED Final   Candida krusei NOT DETECTED NOT DETECTED Final   Candida  parapsilosis NOT DETECTED NOT DETECTED Final   Candida tropicalis NOT DETECTED NOT DETECTED Final   Cryptococcus neoformans/gattii NOT DETECTED NOT DETECTED Final   Meth resistant mecA/C and MREJ DETECTED (A) NOT DETECTED Final     Comment: CRITICAL RESULT CALLED TO, READ BACK BY AND VERIFIED WITH: PHARMD CHRISTINE SHADE ON 11/18/20 AT 1508 BY KJ Performed at [facility], [place] [address], [place], [place] [address]   Resp Panel by RT-PCR (Flu A&B, Covid) Nasopharyngeal Swab     Status: None   Collection Time: 11/17/20  7:12 PM   Specimen: Nasopharyngeal Swab; Nasopharyngeal(NP) swabs in vial transport medium  Result Value Ref Range Status   SARS Coronavirus 2 by RT PCR NEGATIVE NEGATIVE Final    Comment: (NOTE) SARS-CoV-2 target nucleic acids are NOT DETECTED.  The SARS-CoV-2 RNA is generally detectable in upper respiratory specimens during the acute phase of infection. The lowest concentration of SARS-CoV-2 viral copies this assay can detect is 138 copies/mL. A negative result does not preclude SARS-Cov-2 infection and should not be used as the sole basis for treatment or other patient management decisions. A negative result may occur with  improper specimen collection/handling, submission of specimen other than nasopharyngeal swab, presence of viral mutation(s) within the areas targeted by this assay, and inadequate number of viral copies(<138 copies/mL). A negative result must be combined with clinical observations, patient history, and epidemiological information. The expected result is Negative.  Fact Sheet for Patients:  EntrepreneurPulse.com.au  Fact Sheet for Healthcare Providers:  IncredibleEmployment.be  This test is no t yet approved or cleared by the [place] FDA and  has been authorized for detection and/or diagnosis of SARS-CoV-2 by FDA under an Emergency Use Authorization (EUA). This EUA will remain  in effect (meaning this test can be used) for the duration of the COVID-19 declaration under Section 564(b)(1) of the Act, 21 U.S.C.section 360bbb-3(b)(1), unless the authorization is terminated  or revoked sooner.       Influenza A by PCR  NEGATIVE NEGATIVE Final   Influenza B by PCR NEGATIVE NEGATIVE Final    Comment: (NOTE) The Xpert Xpress SARS-CoV-2/FLU/RSV plus assay is intended as an aid in the diagnosis of influenza from Nasopharyngeal swab specimens and should not be used as a sole basis for treatment. Nasal washings and aspirates are unacceptable for Xpert Xpress SARS-CoV-2/FLU/RSV testing.  Fact Sheet for Patients: EntrepreneurPulse.com.au  Fact Sheet for Healthcare Providers: IncredibleEmployment.be  This test is not yet approved or cleared by the [place] FDA and has been authorized for detection and/or diagnosis of SARS-CoV-2 by FDA under an Emergency Use Authorization (EUA). This EUA will remain in effect (meaning this test can be used) for the duration of the COVID-19 declaration under Section 564(b)(1) of the Act, 21 U.S.C. section 360bbb-3(b)(1), unless the authorization is terminated or revoked.  Performed at [facility], [place] [address] [place], [place] [address]   CSF culture     Status: None   Collection Time: 11/18/20  1:56 PM   Specimen: CSF; Cerebrospinal Fluid  Result Value Ref Range Status   Specimen Description   Final    CSF Performed at [facility] [address], [place], [place] [address]    Special Requests   Final    LP Performed at [facility] [address] [place], [place] [address]    Gram Stain   Final    WBC PRESENT, PREDOMINANTLY MONONUCLEAR NO ORGANISMS SEEN CYTOSPIN SMEAR Gram Stain  Report Called to,Read Back By and Verified With: H.JENKINS, RN AT 4854 ON 04.08.22 BY N.THOMPSON Performed at [facility] [address] [place], [place] [address]    Culture   Final    NO GROWTH 3 DAYS Performed at [facility], [place] [address], [place], [place] [address]    Report Status 11/22/2020 FINAL  Final  Fungus Culture With Stain     Status:  None (Preliminary result)   Collection Time: 11/18/20  1:56 PM   Specimen: Lumbar Puncture; Cerebrospinal Fluid  Result Value Ref Range Status   Fungus Stain Final report  Final    Comment: (NOTE) Performed At: [facility] [address] [place], [place] [id number] Rush Farmer MD [id number]    Fungus (Mycology) Culture PENDING  Incomplete   Fungal Source CSF  Final    Comment: Performed at [facility], [place] [address] [place], [place] [address]  Fungus Culture Result     Status: None   Collection Time: 11/18/20  1:56 PM  Result Value Ref Range Status   Result 1 Comment  Final    Comment: (NOTE) KOH/Calcofluor preparation:  no fungus observed. Performed At: [facility] [place], [place] [id number] Rush Farmer MD RW:[id number]   Culture, blood (routine x 2)     Status: None   Collection Time: 11/18/20  7:57 PM   Specimen: BLOOD  Result Value Ref Range Status   Specimen Description   Final    BLOOD RIGHT ANTECUBITAL Performed at [facility] [address] [place], [place] [address]    Special Requests   Final    BOTTLES DRAWN AEROBIC ONLY Blood Culture adequate volume Performed at [facility] [address] [place], [place] [address]    Culture   Final    NO GROWTH 5 DAYS Performed at [facility], [place] [address] [place], [place] [address]    Report Status 11/24/2020 FINAL  Final  Culture, blood (routine x 2)     Status: None   Collection Time: 11/18/20  7:57 PM   Specimen: BLOOD RIGHT HAND  Result Value Ref Range Status   Specimen Description   Final    BLOOD RIGHT HAND Performed at [facility] [address] [place], [place] [address]    Special Requests   Final    BOTTLES DRAWN AEROBIC ONLY Blood Culture adequate volume Performed at [facility] [address] [place], [place] [address]    Culture   Final    NO  GROWTH 5 DAYS Performed at [facility], [place] [address] [place],  [address]    Report Status 11/24/2020 FINAL  Final    Studies/Results: [place] EKG SITE RITE  Result Date: 11/23/2020 If Site Rite image not attached, placement could not be confirmed due to current cardiac rhythm.     Assessment/Plan:  INTERVAL HISTORY:   He seems much improved in terms of her psychiatric condition  Principal Problem:   Bipolar affective disorder, currently manic, mild (HCC) Active Problems:   Acute metabolic encephalopathy   Severe sepsis (HCC)   AF (paroxysmal atrial fibrillation) (HCC)   Sacral wound   Chronic diastolic CHF (congestive heart failure) (HCC)   Bipolar disorder (HCC)   SIRS (systemic inflammatory response syndrome) (HCC)   Nausea and vomiting   Mixed diabetic hyperlipidemia associated with type 2 diabetes mellitus ([place])   Type 2 diabetes mellitus with hyperglycemia,  with long-term current use of insulin (HCC)   Constipation   Encephalopathy   MRSA bacteremia    Rocky Rishel is a 75 y.o. 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   can be considered as a "complicated "  Repeat blood cultures have been taken and no growth  2D echocardiogram does not show vegetations  Given concerns about "red man" syndrome we changed to daptomycin  MRI showed a possible subacute infarct  I would not get a TEE but simply treat her for 4 weeks of systemic antibiotics  Schizophrenia/bipolar complicating interview of patient.  Diagnosis: MRSA bacteremia  Culture Result: MRSA  Allergies  Allergen Reactions  . Chlorhexidine Gluconate Itching  . Acetazolamide Er Rash    OPAT Orders Discharge antibiotics:  Daptomycin  Duration:  4 weeks End Date:  12/20/2020  [facility] Care Per Protocol:    Labs  weekly while on IV antibiotics: _x_  CBC with differential _x_ BMP w GFR/CMP x__ CRP x__ ESR   _x_ CK  __ Please pull PIC at completion of IV antibiotics __ Please leave PIC in place until doctor has seen patient or been notified  Fax weekly labs to [phone]  Clinic Follow Up Appt:   Maxene Byington has an appointment on 12/20/2020 with Dr. Tommy Medal @ [facility] for Infectious Disease is located in the [facility] at  [place] in [place].  [address], which is located to the left of the elevators.  Phone: [phone]  Fax: [phone]  [email or web address]  She should arrive at least 15 if not 30 minutes prior to her appointment.  I will sign off for now please call with further questions    LOS: 6 days   Alcide Evener 11/24/2020, 3:09 PM

## 2020-11-24 NOTE — Progress Notes (Signed)
Patient's IVC expires today, but CSW sees no need to renew at this time as patient is not trying to leave the hospital. MD in agreement.   Blenda Nicely, [place] Clinical Social Worker [phone]

## 2020-11-24 NOTE — TOC Progression Note (Signed)
Transition of Care [facility]) - Progression Note    Patient Details  Name: Becky Hendricks MRN: [id number] Date of Birth: 1946-06-25  [facility] CM/SW Contact  Baldemar Lenis, [place] Phone Number: 11/24/2020, 3:24 PM  Clinical Narrative:   CSW has attempted to contact [name] to confirm that they are able to take the patient back at discharge. Per chart review, patient has been at [facility] since November 2021. CSW spoke with Admissions Coordinator, and she said that Administrator would have to answer whether they could take her back because she's a complicated case. CSW waiting on call from Administrator to discuss the patient, have not received a call yesterday or today. CSW asked Admissions again this morning for Administrator to call. CSW to follow.    Expected Discharge Plan: Skilled Nursing Facility Barriers to Discharge: Continued Medical Work up,SNF Pending bed offer  Expected Discharge Plan and Services Expected Discharge Plan: Skilled Nursing Facility       Living arrangements for the past 2 months: Skilled Nursing Facility                                       Social Determinants of Health (SDOH) Interventions    Readmission Risk Interventions No flowsheet data found.

## 2020-11-24 NOTE — Progress Notes (Signed)
Patient was anuric, bladder scanned 461 ml, MD placed N.O. for In and Out cath. Patient refused cath. Day shift nurse notified.

## 2020-11-24 NOTE — Progress Notes (Signed)
PROGRESS NOTE                                                                                                                                                                                                             Patient Demographics:    Becky Hendricks, is a 75 y.o. female, DOB - [birth year], RFX:[id number]  Outpatient Primary MD for the patient is Becky Denmark, MD    LOS - 6  Admit date - 11/17/2020    Chief Complaint  Patient presents with  . ivc  . 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  So far LP, CT scan chest abdomen pelvis nonacute, MRI with questionable occipital lobe changes.   Subjective:   Patient in bed, appears comfortable, denies any headache, no fever, no chest pain or pressure, no shortness of breath , no abdominal pain. No new focal weakness.    Assessment  & Plan :   1.  Acute toxic encephalopathy gradually progressive at SNF - MRI with ?  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  2. Sepsis in the setting of MRSA bacteremia - ID consulted, on Vancomycin >> Dapto, TTE stable, per ID no need for TEE,  repeat cultures stable thus far.  Per ID total of 4 weeks of IV antibiotics.  3.  Bipolar disorder.  Currently on combination of Depakote and Haldol, per Psych Seroquel held.  Psych has seen the patient, request continued follow-up.  According to the brother she had stopped taking her psych medications 2 months ago, her main issue this admission seems to be largely psych disorder per neurology, will defer management to psych request Psych to follow daily.  4. H/O Sacral Osteomyelitis with healed Scaral Decub Ulcer - continue nursing wound care, stable ESR CRP and procalcitonin as well.   5. N&V with stool ball - supportive Rx, bowel regimen.   6.  Paroxysmal A. fib.  Mali vas 2 score of greater than 3.  Currently on Eliquis, not on any rate controlling agents.  7.  Dyslipidemia.  On statin.  8.  Chronic diastolic CHF EF 16%.  Currently compensated.  9.  Obesity.  BMI of 31.  Supportive care follow-up with PCP for weight loss.  10.  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 Vanco stopped 11/20/20.   11. ? MRI changes DW Neuro Dr Rory Percy, likely PRESS - BP now in good control.  12.  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  13. DM type II.  For now sliding scale.  Lab Results  Component Value Date   HGBA1C 6.1 (H) 11/18/2020   CBG (last 3)  Recent Labs    11/23/20 1604 11/24/20 0022 11/24/20 0603  GLUCAP 96 167* 110*        Condition - Fair  Family Communication  : Brother [name] [phone] over the phone on 11/20/2020 at 9:36 AM, 11/21/20, discussed clearly patient's  refusal to take medications, has been refusing taking her psych medications for several months.  If significant decline he will consider comfort measures.  For now we are trying her best to optimize medication intake and improve her mentation, 11/24/20  Code Status :  Full  Consults  :  Neuro, Psych, ID  PUD Prophylaxis : PPI   Procedures  :     MRI - Ill-defined patchy hyperintensity in the left occipital parietal lobe without restricted diffusion or abnormal enhancement. Differential diagnosis includes late subacute infarct, encephalitis.   LP - non acute  CT - 1. Main pulmonary artery measures at the upper limits of normal. Correlate with pulmonary hypertension. 2. Otherwise no acute intrathoracic abnormality. 3. Cholelithiasis with no CT findings of acute cholecystitis. 4. An 8 cm stool ball within the rectum with no associated findings suggest of stercoral colitis. 5. Grossly stable in size uterine fibroids  TTE - 1. Limited study due to poor acoustic windows. Left ventricular ejection fraction, by estimation, is 60 to 65%. The left ventricle has normal function. The left ventricle has no regional wall motion abnormalities. There is mild concentric left ventricular hypertrophy. Left ventricular diastolic parameters are consistent with Grade I diastolic dysfunction (impaired relaxation).  2. Right ventricular systolic function is mildly reduced. The right ventricular size is mildly-to-moderately enlarged.  3. The mitral valve is normal in structure. Trivial mitral valve regurgitation. No evidence of mitral stenosis.  4. The aortic valve is tricuspid. There is mild calcification of the aortic valve. There is mild thickening of the aortic valve. Aortic valve regurgitation is not visualized. Mild aortic valve sclerosis is present, with no evidence of aortic valve stenosis.  5. No valvular vegetations visualized on surface echo      Disposition Plan  :  Status is: Inpatient  Remains  inpatient appropriate because:IV treatments appropriate due to intensity of illness or inability to take PO   Dispo: The patient is from: SNF              Anticipated d/c is to: SNF              Patient currently is not medically stable to d/c.   Difficult to place patient No   DVT Prophylaxis  :  Eliquis  Lab Results  Component Value Date   PLT 226 11/24/2020    Diet :  Diet Order            DIET SOFT Room service appropriate? Yes; Fluid consistency: Thin  Diet effective now                  Inpatient Medications  Scheduled Meds: . amLODipine  5 mg Oral Daily  . apixaban  5 mg Oral BID  . atorvastatin  10 mg Oral Daily  . cloNIDine  0.2 mg Transdermal Weekly  . divalproex  500 mg Oral Q12H  . docusate sodium  200 mg Oral BID  . haloperidol  10 mg Oral TID   Or  . haloperidol lactate  10 mg Intravenous TID  . insulin aspart  0-15 Units Subcutaneous Q6H  . multivitamin with minerals  1 tablet Oral Daily  . pantoprazole  40 mg Oral Daily  . polyethylene glycol  17 g Oral BID  . tamsulosin  0.4 mg Oral Daily   Continuous Infusions: . DAPTOmycin (CUBICIN)  IV Stopped (11/23/20 2027)   PRN Meds:.acetaminophen **OR** acetaminophen, haloperidol lactate, hydrALAZINE, LORazepam, [DISCONTINUED] ondansetron **OR** ondansetron (ZOFRAN) IV, QUEtiapine  Antibiotics  :    Anti-infectives (From admission, onward)   Start     Dose/Rate Route Frequency Ordered Stop   11/20/20 2000  DAPTOmycin (CUBICIN) 700 mg in sodium chloride 0.9 % IVPB        8 mg/kg  88 kg 128 mL/hr over 30 Minutes Intravenous Daily 11/20/20 1657 12/16/20 2359   11/18/20 2200  vancomycin (VANCOREADY) IVPB 1250 mg/250 mL  Status:  Discontinued        1,250 mg 166.7 mL/hr over 90 Minutes Intravenous Every 24 hours 11/17/20 2345 11/20/20 1644   11/18/20 0000  ampicillin (OMNIPEN) 2 g in sodium chloride 0.9 % 100 mL IVPB  Status:  Discontinued        2 g 300 mL/hr over 20 Minutes Intravenous Every 4 hours  11/17/20 2319 11/18/20 1520   11/17/20 2345  vancomycin (VANCOCIN) IVPB 1000 mg/200 mL premix        1,000 mg 200 mL/hr over 60 Minutes Intravenous  Once 11/17/20 2331 11/18/20 0155   11/17/20 2330  cefTRIAXone (ROCEPHIN) 2 g in sodium chloride 0.9 % 100 mL IVPB  Status:  Discontinued        2 g 200 mL/hr over 30 Minutes Intravenous Every 12 hours 11/17/20 2319 11/18/20 1520       Time Spent in minutes  30   Lala Lund M.D on 11/24/2020 at 10:17 AM  To page go to [email or web address]   [facility] -  Office  [phone]    See all Orders from today for further details    Objective:   Vitals:   11/23/20 2251 11/24/20 0023 11/24/20 0400 11/24/20 0821  BP: (!) 142/58 113/74 (!) 147/69 128/65  Pulse: 65 76 71 89  Resp: _0 Temp: 97.7 F (36.5 C) 97.8  F (36.6 C) 98 F (36.7 C) 98.5 F (36.9 C)  TempSrc: Oral Oral Oral Oral  SpO2: 95% 97% 100% 100%  Weight:        Wt Readings from Last 3 Encounters:  11/20/20 88 kg  10/12/20 88.5 kg  05/06/20 92.5 kg     Intake/Output Summary (Last 24 hours) at 11/24/2020 1017 Last data filed at 11/24/2020 0929 Gross per 24 hour  Intake 1020 ml  Output 650 ml  Net 370 ml     Physical Exam  Awake , calmer,  No new F.N deficits,   [place].AT,PERRAL Supple Neck,No JVD, No cervical lymphadenopathy appriciated.  Symmetrical Chest wall movement, Good air movement bilaterally, CTAB RRR,No Gallops, Rubs or new Murmurs, No Parasternal Heave +ve B.Sounds, Abd Soft, No tenderness, No organomegaly appriciated, No rebound - guarding or rigidity. No Cyanosis,  diffuse macular rash in arms and trunk improving    Data Review:    CBC Recent Labs  Lab 11/19/20 1933 11/21/20 0245 11/22/20 0451 11/23/20 0528 11/24/20 0238  WBC 8.1 9.5 8.2 9.7 7.9  HGB 13.3 11.8* 12.4 12.0 11.0*  HCT 41.5 36.3 38.4 37.2 35.2*  PLT 242 233 238 237 226  MCV 90.4 88.1 89.9 91.0 92.4  MCH 29.0 28.6 29.0 29.3 28.9  MCHC 32.0 32.5 32.3 32.3  31.3  RDW 14.3 14.2 14.8 15.1 15.2  LYMPHSABS 2.3 2.2 2.9 3.3 3.1  MONOABS 0.9 1.0 0.9 1.2* 1.0  EOSABS 0.4 0.5 0.6* 0.5 0.5  BASOSABS 0.0 0.0 0.0 0.0 0.0    Recent Labs  Lab 11/17/20 1600 11/17/20 1842 11/18/20 0154 11/18/20 1109 11/18/20 1356 11/19/20 1933 11/20/20 0208 11/21/20 0245 11/22/20 0451 11/23/20 0528 11/24/20 0238  NA  --   --  133*  --   --  139  --  141 142 143 142  K  --   --  4.1  --   --  4.2  --  3.6 3.3* 4.1 4.1  CL  --   --  96*  --   --  104  --  106 106 108 104  CO2  --   --  27  --   --  25  --  _0 32  GLUCOSE  --   --  155*  --   --  83  --  139* 135* 109* 103*  BUN  --   --  18  --   --  19  --  _1 CREATININE  --   --  0.69  --   --  0.55  --  0.76 0.67 0.58 0.72  CALCIUM  --   --  8.9  --   --  8.8*  --  9.0 9.3 8.9 8.7*  AST  --   --  13*  --   --  18  --  12* 11* 12* 11*  ALT  --   --  13  --   --  12  --  _2 ALKPHOS  --   --  43  --   --  42  --  38 41 38 41  BILITOT  --   --  0.6  --   --  1.2  --  0.7 0.7 0.8 0.8  ALBUMIN  --   --  3.3*  --    < > 3.2*  --  2.7* 2.8* 2.6* 2.4*  MG   < >  --  1.7  --   --  1.9  --  1.7 1.9 1.9 2.0  CRP   < >  --  1.1*  --   --   --  1.1* 1.2* 1.5* 1.6* 1.6*  PROCALCITON  --   --  <0.10  --   --   --   --  <0.10 <0.10 <0.10 <0.10  LATICACIDVEN  --  1.4 1.5 1.5  --   --   --   --   --   --   --   INR  --   --   --  1.1  --   --   --   --   --   --   --   TSH  --   --  0.771  --   --   --   --   --   --   --   --   HGBA1C  --   --  6.1*  --   --   --   --   --   --   --   --   AMMONIA  --   --  22  --   --   --   --   --   --   --   --   BNP  --   --   --   --   --   --   --  47.7 28.1 15.6 21.4   < > = values in this interval not displayed.    ------------------------------------------------------------------------------------------------------------------ No results for input(s): CHOL, HDL, LDLCALC, TRIG, CHOLHDL, LDLDIRECT in the last 72 hours.  Lab Results  Component Value Date    HGBA1C 6.1 (H) 11/18/2020   ------------------------------------------------------------------------------------------------------------------ No results for input(s): TSH, T4TOTAL, T3FREE, THYROIDAB in the last 72 hours.  Invalid input(s): FREET3  Cardiac Enzymes No results for input(s): CKMB, TROPONINI, MYOGLOBIN in the last 168 hours.  Invalid input(s): CK ------------------------------------------------------------------------------------------------------------------    Component Value Date/Time   BNP 21.4 11/24/2020 0238     Radiology Reports CT ANGIO HEAD W OR WO CONTRAST  Result Date: 11/21/2020 CLINICAL DATA:  Initial evaluation for neuro deficit, stroke suspected. EXAM: CT ANGIOGRAPHY HEAD AND NECK TECHNIQUE: Multidetector CT imaging of the head and neck was performed using the standard protocol during bolus administration of intravenous contrast. Multiplanar CT image reconstructions and MIPs were obtained to evaluate the vascular anatomy. Carotid stenosis measurements (when applicable) are obtained utilizing NASCET criteria, using the distal internal carotid diameter as the denominator. CONTRAST:  58m OMNIPAQUE IOHEXOL 350 MG/ML SOLN COMPARISON:  Prior MRI from 11/19/2020. FINDINGS: CT HEAD FINDINGS Brain: Age-related cerebral atrophy with mild chronic small vessel ischemic disease. Recently identified signal changes involving the left parieto-occipital region not visible by CT. No acute intracranial hemorrhage. No acute large vessel territory infarct. No mass lesion or midline shift. No hydrocephalus or extra-axial fluid collection. Vascular: No hyperdense vessel. Scattered vascular calcifications noted within the carotid siphons. Skull: Scalp soft tissues and calvarium within normal limits. Sinuses: Scattered mucosal thickening noted within the ethmoidal air cells. Small air-fluid level noted within the right sphenoid sinus. Paranasal sinuses are otherwise clear. No mastoid  effusion. Orbits: Globes and orbital soft tissues within normal limits. Review of the MIP images confirms the above findings CTA NECK FINDINGS Aortic arch: Visualized aortic arch within normal limits for caliber with normal 3 vessel morphology. No hemodynamically significant stenosis about the origin the great vessels. Right carotid system: Right common and internal  carotid arteries widely patent without stenosis, dissection or occlusion. Left carotid system: Left common and internal carotid arteries widely patent without stenosis, dissection or occlusion. Vertebral arteries: Both vertebral arteries arise from subclavian arteries. No proximal subclavian artery stenosis. Moderate narrowing of the left V2 segment at the level of C3 related to an extrinsic compression from adjacent uncovertebral disease. Vertebral arteries otherwise widely patent without stenosis, dissection or occlusion. Skeleton: No acute osseous finding. No discrete or worrisome osseous lesions. Hyperostosis frontalis interna noted. Other neck: No other acute soft tissue abnormality within the neck. No mass or adenopathy. Upper chest: Which lies upper chest demonstrates no acute finding. Review of the MIP images confirms the above findings CTA HEAD FINDINGS Anterior circulation: Both internal carotid arteries widely patent to the termini without stenosis. A1 segments widely patent. Normal anterior communicating artery complex. Both anterior cerebral arteries widely patent to their distal aspects without stenosis. No M1 stenosis or occlusion. Normal MCA bifurcations. Distal MCA branches well perfused and symmetric. Posterior circulation: Both V4 segments patent to the vertebrobasilar junction without stenosis. Neither PICA origin well visualized. Basilar widely patent to its distal aspect without stenosis. Superior cerebellar arteries patent bilaterally. Both PCAs primarily supplied via the basilar and are well perfused to there distal aspects.  Venous sinuses: Patent allowing for timing contrast bolus. Anatomic variants: None significant.  No intracranial aneurysm. Review of the MIP images confirms the above findings IMPRESSION: CT HEAD IMPRESSION: 1. No acute intracranial abnormality. Recently identified signal changes at the left parieto-occipital region not visible by CT. 2. Age-related cerebral atrophy with mild chronic small vessel ischemic disease. CTA HEAD AND NECK IMPRESSION: Negative CTA of the head and neck. No large vessel occlusion, hemodynamically significant stenosis, or other acute vascular abnormality. Electronically Signed   By: Jeannine Boga M.D.   On: 11/21/2020 23:22   DG Abd 1 View  Result Date: 11/21/2020 CLINICAL DATA:  Nausea EXAM: ABDOMEN - 1 VIEW COMPARISON:  None. FINDINGS: There is diffuse stool throughout colon. No bowel dilatation or air-fluid level to suggest bowel obstruction. No free air. There are pelvic calcifications consistent with phleboliths. Visualized lung bases clear. IMPRESSION: Diffuse stool throughout colon. Question a degree of underlying constipation. No bowel obstruction or free air. Electronically Signed   By: Lowella Grip III M.D.   On: 11/21/2020 12:18   CT HEAD WO CONTRAST  Result Date: 11/17/2020 CLINICAL DATA:  Altered mental status. EXAM: CT HEAD WITHOUT CONTRAST TECHNIQUE: Contiguous axial images were obtained from the base of the skull through the vertex without intravenous contrast. COMPARISON:  May 05, 2020 FINDINGS: Brain: There is mild cerebral atrophy with widening of the extra-axial spaces and ventricular dilatation. There are areas of decreased attenuation within the white matter tracts of the supratentorial brain, consistent with microvascular disease changes. Vascular: No hyperdense vessel or unexpected calcification. Skull: Normal. Negative for fracture or focal lesion. Sinuses/Orbits: Mild bilateral ethmoid sinus mucosal thickening is seen. Other: The study is  limited secondary to patient motion. IMPRESSION: 1. Generalized cerebral atrophy. 2. No acute intracranial abnormality. Electronically Signed   By: Virgina Norfolk M.D.   On: 11/17/2020 17:17   CT ANGIO NECK W OR WO CONTRAST  Result Date: 11/21/2020 CLINICAL DATA:  Initial evaluation for neuro deficit, stroke suspected. EXAM: CT ANGIOGRAPHY HEAD AND NECK TECHNIQUE: Multidetector CT imaging of the head and neck was performed using the standard protocol during bolus administration of intravenous contrast. Multiplanar CT image reconstructions and MIPs were obtained to evaluate the vascular anatomy.  Carotid stenosis measurements (when applicable) are obtained utilizing NASCET criteria, using the distal internal carotid diameter as the denominator. CONTRAST:  81m OMNIPAQUE IOHEXOL 350 MG/ML SOLN COMPARISON:  Prior MRI from 11/19/2020. FINDINGS: CT HEAD FINDINGS Brain: Age-related cerebral atrophy with mild chronic small vessel ischemic disease. Recently identified signal changes involving the left parieto-occipital region not visible by CT. No acute intracranial hemorrhage. No acute large vessel territory infarct. No mass lesion or midline shift. No hydrocephalus or extra-axial fluid collection. Vascular: No hyperdense vessel. Scattered vascular calcifications noted within the carotid siphons. Skull: Scalp soft tissues and calvarium within normal limits. Sinuses: Scattered mucosal thickening noted within the ethmoidal air cells. Small air-fluid level noted within the right sphenoid sinus. Paranasal sinuses are otherwise clear. No mastoid effusion. Orbits: Globes and orbital soft tissues within normal limits. Review of the MIP images confirms the above findings CTA NECK FINDINGS Aortic arch: Visualized aortic arch within normal limits for caliber with normal 3 vessel morphology. No hemodynamically significant stenosis about the origin the great vessels. Right carotid system: Right common and internal carotid  arteries widely patent without stenosis, dissection or occlusion. Left carotid system: Left common and internal carotid arteries widely patent without stenosis, dissection or occlusion. Vertebral arteries: Both vertebral arteries arise from subclavian arteries. No proximal subclavian artery stenosis. Moderate narrowing of the left V2 segment at the level of C3 related to an extrinsic compression from adjacent uncovertebral disease. Vertebral arteries otherwise widely patent without stenosis, dissection or occlusion. Skeleton: No acute osseous finding. No discrete or worrisome osseous lesions. Hyperostosis frontalis interna noted. Other neck: No other acute soft tissue abnormality within the neck. No mass or adenopathy. Upper chest: Which lies upper chest demonstrates no acute finding. Review of the MIP images confirms the above findings CTA HEAD FINDINGS Anterior circulation: Both internal carotid arteries widely patent to the termini without stenosis. A1 segments widely patent. Normal anterior communicating artery complex. Both anterior cerebral arteries widely patent to their distal aspects without stenosis. No M1 stenosis or occlusion. Normal MCA bifurcations. Distal MCA branches well perfused and symmetric. Posterior circulation: Both V4 segments patent to the vertebrobasilar junction without stenosis. Neither PICA origin well visualized. Basilar widely patent to its distal aspect without stenosis. Superior cerebellar arteries patent bilaterally. Both PCAs primarily supplied via the basilar and are well perfused to there distal aspects. Venous sinuses: Patent allowing for timing contrast bolus. Anatomic variants: None significant.  No intracranial aneurysm. Review of the MIP images confirms the above findings IMPRESSION: CT HEAD IMPRESSION: 1. No acute intracranial abnormality. Recently identified signal changes at the left parieto-occipital region not visible by CT. 2. Age-related cerebral atrophy with mild  chronic small vessel ischemic disease. CTA HEAD AND NECK IMPRESSION: Negative CTA of the head and neck. No large vessel occlusion, hemodynamically significant stenosis, or other acute vascular abnormality. Electronically Signed   By: BJeannine BogaM.D.   On: 11/21/2020 23:22   CT Chest W Contrast  Result Date: 11/17/2020 CLINICAL DATA:  Fever, altered mental status.  Nausea and vomiting. EXAM: CT CHEST, ABDOMEN, AND PELVIS WITH CONTRAST TECHNIQUE: Multidetector CT imaging of the chest, abdomen and pelvis was performed following the standard protocol during bolus administration of intravenous contrast. CONTRAST:  1021mOMNIPAQUE IOHEXOL 300 MG/ML  SOLN COMPARISON:  CT abdomen pelvis 12/20/2019, CT angiography chest 12/24/2019 FINDINGS: CT CHEST FINDINGS Cardiovascular: Normal heart size. No significant pericardial effusion. The thoracic aorta is normal in caliber. Mild atherosclerotic plaque of the thoracic aorta. The main pulmonary artery measures  at the upper limits of normal: 3.2 cm. Mediastinum/Nodes: No enlarged mediastinal, hilar, or axillary lymph nodes. Thyroid gland, trachea, and esophagus demonstrate no significant findings. Lungs/Pleura: Right lower lobe atelectasis with persistent elevated right hemidiaphragm. Limited evaluation for pulmonary nodule due to respiratory motion artifact. No pulmonary mass. No focal consolidation. No pleural effusion. No pneumothorax. Musculoskeletal: No chest wall abnormality. No suspicious lytic or blastic osseous lesions. No acute displaced fracture. Multilevel degenerative changes of the spine. CT ABDOMEN PELVIS FINDINGS Hepatobiliary: No focal liver abnormality. Gallstone noted within the gallbladder lumen. No gallbladder wall thickening or pericholecystic fluid. No biliary dilatation. Pancreas: No focal lesion. Normal pancreatic contour. No surrounding inflammatory changes. No main pancreatic ductal dilatation. Spleen: Normal in size without focal abnormality.  Adrenals/Urinary Tract: No adrenal nodule bilaterally. Bilateral kidneys enhance symmetrically. Exophytic hypodense 2.2 cm lesion within the right kidney likely represent simple renal cysts. Slightly increased density likely due to streak artifact at this level. No hydronephrosis. No hydroureter. The urinary bladder is unremarkable. On delayed imaging, there is no urothelial wall thickening and there are no filling defects in the opacified portions of the bilateral collecting systems or ureters. Stomach/Bowel: Stomach is within normal limits. No evidence of bowel wall thickening or dilatation. 8 cm stool ball within the rectum. The appendix not definitely identified. Vascular/Lymphatic: No abdominal aorta or iliac aneurysm. Mild atherosclerotic plaque of the aorta and its branches. No abdominal, pelvic, or inguinal lymphadenopathy. Reproductive: Difficult to measure grossly stable uterine fibroids. No adnexal lesion. Other: No intraperitoneal free fluid. No intraperitoneal free gas. No organized fluid collection. Musculoskeletal: Tiny fat containing left inguinal hernia. No suspicious lytic or blastic osseous lesions. No acute displaced fracture. Multilevel degenerative changes of the spine. IMPRESSION: 1. Main pulmonary artery measures at the upper limits of normal. Correlate with pulmonary hypertension. 2. Otherwise no acute intrathoracic abnormality. 3. Cholelithiasis with no CT findings of acute cholecystitis. 4. An 8 cm stool ball within the rectum with no associated findings suggest of stercoral colitis. 5. 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 Electronically Signed   By: Iven Finn M.D.   On: 11/17/2020 20:07   MR BRAIN W WO CONTRAST  Result Date: 11/19/2020 CLINICAL DATA:  Mental status change.  Encephalopathy EXAM: MRI HEAD WITHOUT AND WITH CONTRAST TECHNIQUE: Multiplanar, multiecho pulse sequences of the brain and surrounding structures were obtained without and with intravenous contrast.  CONTRAST:  7.1m GADAVIST GADOBUTROL 1 MMOL/ML IV SOLN COMPARISON:  CT head 11/17/2020.  MRI head 12/24/2019 FINDINGS: Brain: Patchy areas of hyperintensity in the left occipital pole extending into the left posterior parietal lobe best seen on FLAIR. This involves primarily the white matter but some areas of cortex. This area does not show restricted diffusion or susceptibility. This area was normal on the prior MRI 2021. No abnormal enhancement. Moderate atrophy. Mild white matter changes consistent with chronic microvascular ischemia. Diffusion-weighted imaging negative for acute infarct. No hemorrhage or mass lesion. Vascular: Normal arterial flow voids Skull and upper cervical spine: Negative Sinuses/Orbits: Mild mucosal edema paranasal sinuses. Negative orbit Other: None IMPRESSION: Ill-defined patchy hyperintensity in the left occipital parietal lobe without restricted diffusion or abnormal enhancement. Differential diagnosis includes late subacute infarct, encephalitis. Tumor not considered likely. Correlate with symptoms. Electronically Signed   By: CFranchot GalloM.D.   On: 11/19/2020 15:24   CT Abdomen Pelvis W Contrast  Result Date: 11/17/2020 CLINICAL DATA:  Fever, altered mental status.  Nausea and vomiting. EXAM: CT CHEST, ABDOMEN, AND PELVIS WITH CONTRAST TECHNIQUE: Multidetector CT  imaging of the chest, abdomen and pelvis was performed following the standard protocol during bolus administration of intravenous contrast. CONTRAST:  139mL OMNIPAQUE IOHEXOL 300 MG/ML  SOLN COMPARISON:  CT abdomen pelvis 12/20/2019, CT angiography chest 12/24/2019 FINDINGS: CT CHEST FINDINGS Cardiovascular: Normal heart size. No significant pericardial effusion. The thoracic aorta is normal in caliber. Mild atherosclerotic plaque of the thoracic aorta. The main pulmonary artery measures at the upper limits of normal: 3.2 cm. Mediastinum/Nodes: No enlarged mediastinal, hilar, or axillary lymph nodes. Thyroid gland,  trachea, and esophagus demonstrate no significant findings. Lungs/Pleura: Right lower lobe atelectasis with persistent elevated right hemidiaphragm. Limited evaluation for pulmonary nodule due to respiratory motion artifact. No pulmonary mass. No focal consolidation. No pleural effusion. No pneumothorax. Musculoskeletal: No chest wall abnormality. No suspicious lytic or blastic osseous lesions. No acute displaced fracture. Multilevel degenerative changes of the spine. CT ABDOMEN PELVIS FINDINGS Hepatobiliary: No focal liver abnormality. Gallstone noted within the gallbladder lumen. No gallbladder wall thickening or pericholecystic fluid. No biliary dilatation. Pancreas: No focal lesion. Normal pancreatic contour. No surrounding inflammatory changes. No main pancreatic ductal dilatation. Spleen: Normal in size without focal abnormality. Adrenals/Urinary Tract: No adrenal nodule bilaterally. Bilateral kidneys enhance symmetrically. Exophytic hypodense 2.2 cm lesion within the right kidney likely represent simple renal cysts. Slightly increased density likely due to streak artifact at this level. No hydronephrosis. No hydroureter. The urinary bladder is unremarkable. On delayed imaging, there is no urothelial wall thickening and there are no filling defects in the opacified portions of the bilateral collecting systems or ureters. Stomach/Bowel: Stomach is within normal limits. No evidence of bowel wall thickening or dilatation. 8 cm stool ball within the rectum. The appendix not definitely identified. Vascular/Lymphatic: No abdominal aorta or iliac aneurysm. Mild atherosclerotic plaque of the aorta and its branches. No abdominal, pelvic, or inguinal lymphadenopathy. Reproductive: Difficult to measure grossly stable uterine fibroids. No adnexal lesion. Other: No intraperitoneal free fluid. No intraperitoneal free gas. No organized fluid collection. Musculoskeletal: Tiny fat containing left inguinal hernia. No suspicious  lytic or blastic osseous lesions. No acute displaced fracture. Multilevel degenerative changes of the spine. IMPRESSION: 1. Main pulmonary artery measures at the upper limits of normal. Correlate with pulmonary hypertension. 2. Otherwise no acute intrathoracic abnormality. 3. Cholelithiasis with no CT findings of acute cholecystitis. 4. An 8 cm stool ball within the rectum with no associated findings suggest of stercoral colitis. 5. 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 Electronically Signed   By: Iven Finn M.D.   On: 11/17/2020 20:07   EEG adult  Result Date: 11/21/2020 Plancher, Baron Sane, MD     11/21/2020  3:50 PM PROCEDURE: EEG routine 22 minutes with video study  DATES OF TEST: 11/21/2020  REASON FOR TEST: acute encephalopathy  AED/Sedative MEDICATIONS: n/a  TECHNIQUE: This is an 18 channel digital EEG recording using the standard international 10/20 system of electrode placement with one channel EKG recording. Pt was awake during this recording.  FINDINGS: Background rhythm: symmetric diffuse 4-8 Hz, mostly myogenic artifact. Amplitude :  low Continuity: continuous Breach effect:  NO Variability: YES Reactivity: YES Rhythmic delta activity: NO Periodic discharges: NO Sporadic epileptiform discharges: NO Electrographic/electroclinical seizures: NO  Limited EKG reveals no abnormalities.   IMPRESSION: This is an abnormal study due to - 1. Moderate to mild Generalized slowing is a non-specific finding consistent with a generalized disturbance of cerebral functioning including toxic, metabolic, or structural abnormalities that are multi-focal or diffuse. 2. No definite epileptiform discharges or electrographic seizures noted.  ECHOCARDIOGRAM COMPLETE  Result Date: 11/19/2020    ECHOCARDIOGRAM REPORT   Patient Name:   RAJA CAPUTI Date of Exam: 11/19/2020 Medical Rec #:  [id number]      Height:       66.0 in Accession #:    [id number]     Weight:       195.2 lb Date of Birth:  [birth year]       BSA:           1.979 m Patient Age:    37 years       BP:           145/76 mmHg Patient Gender: F              HR:           75 bpm. Exam Location:  Inpatient Procedure: 2D Echo Indications:    bacteremia  History:        Patient has prior history of Echocardiogram examinations, most                 recent 12/23/2019. CHF, Arrythmias:Atrial Fibrillation,                 Signs/Symptoms:Bacteremia; Risk Factors:Diabetes.  Sonographer:    Johny Chess Referring Phys: [id number] JTTSV LATIF [facility]  Sonographer Comments: Image acquisition challenging due to uncooperative patient and Image acquisition challenging due to patient body habitus. IMPRESSIONS  1. Limited study due to poor acoustic windows. Left ventricular ejection fraction, by estimation, is 60 to 65%. The left ventricle has normal function. The left ventricle has no regional wall motion abnormalities. There is mild concentric left ventricular hypertrophy. Left ventricular diastolic parameters are consistent with Grade I diastolic dysfunction (impaired relaxation).  2. Right ventricular systolic function is mildly reduced. The right ventricular size is mildly-to-moderately enlarged.  3. The mitral valve is normal in structure. Trivial mitral valve regurgitation. No evidence of mitral stenosis.  4. The aortic valve is tricuspid. There is mild calcification of the aortic valve. There is mild thickening of the aortic valve. Aortic valve regurgitation is not visualized. Mild aortic valve sclerosis is present, with no evidence of aortic valve stenosis.  5. No valvular vegetations visualized on surface echo. Pending clinical suspicion for infective endocarditis, can consider TEE for further evaluation. Comparison(s): Compared to prior TTE on 77/93/90, the RV systolic function appears mildly improved. Otherwise there is no significant change. Conclusion(s)/Recommendation(s): No evidence of valvular vegetations on this transthoracic echocardiogram. Would recommend a  transesophageal echocardiogram to exclude infective endocarditis if clinically indicated. FINDINGS  Left Ventricle: Limited study due to poor acoustic windows. Left ventricular ejection fraction, by estimation, is 60 to 65%. The left ventricle has normal function. The left ventricle has no regional wall motion abnormalities. Definity contrast agent was given IV to delineate the left ventricular endocardial borders. The left ventricular internal cavity size was normal in size. There is mild concentric left ventricular hypertrophy. Left ventricular diastolic parameters are consistent with Grade I diastolic dysfunction (impaired relaxation). Right Ventricle: The right ventricular size is mildly-to-moderately enlarged. Right vetricular wall thickness was not well visualized. Right ventricular systolic function is mildly reduced. Left Atrium: Left atrial size was normal in size. Right Atrium: Right atrial size was normal in size. Pericardium: There is no evidence of pericardial effusion. Mitral Valve: The mitral valve is normal in structure. There is mild thickening of the mitral valve leaflet(s). There is mild calcification of the mitral valve leaflet(s). Trivial mitral valve regurgitation. No evidence of mitral  valve stenosis. Tricuspid Valve: The tricuspid valve is normal in structure. Tricuspid valve regurgitation is trivial. Aortic Valve: The aortic valve is tricuspid. There is mild calcification of the aortic valve. There is mild thickening of the aortic valve. Aortic valve regurgitation is not visualized. Mild aortic valve sclerosis is present, with no evidence of aortic valve stenosis. Pulmonic Valve: The pulmonic valve was not well visualized. Pulmonic valve regurgitation is not visualized. Aorta: The aortic root and ascending aorta are structurally normal, with no evidence of dilitation. IAS/Shunts: No atrial level shunt detected by color flow Doppler.  LEFT VENTRICLE PLAX 2D LVIDd:         4.26 cm LVIDs:          2.71 cm LV PW:         1.17 cm LV IVS:        1.06 cm LVOT diam:     1.80 cm LVOT Area:     2.54 cm  RIGHT VENTRICLE RV S prime:     9.90 cm/s LEFT ATRIUM         Index LA diam:    3.80 cm 1.92 cm/m   AORTA Ao Root diam: 2.90 cm Ao Asc diam:  3.40 cm  SHUNTS Systemic Diam: 1.80 cm Gwyndolyn Kaufman MD Electronically signed by Gwyndolyn Kaufman MD Signature Date/Time: 11/19/2020/5:00:20 PM    Final    [place] EKG SITE RITE  Result Date: 11/23/2020 If Site Rite image not attached, placement could not be confirmed due to current cardiac rhythm.  DG FL GUIDED LUMBAR PUNCTURE  Result Date: 11/18/2020 CLINICAL DATA:  Encephalopathy.  Fever.  Rule out meningitis EXAM: DIAGNOSTIC LUMBAR PUNCTURE UNDER FLUOROSCOPIC GUIDANCE COMPARISON:  CT abdomen pelvis 11/17/2020 FLUOROSCOPY TIME:  Fluoroscopy Time:  0 minutes 48 seconds Radiation Exposure Index (if provided by the fluoroscopic device): Number of Acquired Spot Images: 1 PROCEDURE: Patient was sedated prior to the study. The patient was uncooperative. Informed consent was obtained from the patient's brother by telephone prior to the procedure, including potential complications of headache, allergy, and pain. With the patient prone, the lower back was prepped with Betadine. 1% Lidocaine was used for local anesthesia. Lumbar puncture was performed at the L2-3 level using a 20 gauge needle with return of clear CSF with an opening pressure of 13 cm water. Six ml of CSF were obtained for laboratory studies. The patient tolerated the procedure well and there were no apparent complications. IMPRESSION: Successful lumbar puncture.  Clear CSF. Electronically Signed   By: Franchot Gallo M.D.   On: 11/18/2020 14:20

## 2020-11-25 ENCOUNTER — Inpatient Hospital Stay (HOSPITAL_COMMUNITY): Payer: Medicare Other

## 2020-11-25 DIAGNOSIS — F3111 Bipolar disorder, current episode manic without psychotic features, mild: Secondary | ICD-10-CM | POA: Diagnosis not present

## 2020-11-25 HISTORY — PX: IR FLUORO GUIDE CV LINE RIGHT: IMG2283

## 2020-11-25 HISTORY — PX: IR US GUIDE VASC ACCESS RIGHT: IMG2390

## 2020-11-25 LAB — CBC WITH DIFFERENTIAL/PLATELET
Abs Immature Granulocytes: 0.06 10*3/uL (ref 0.00–0.07)
Basophils Absolute: 0 10*3/uL (ref 0.0–0.1)
Basophils Relative: 0 %
Eosinophils Absolute: 0.5 10*3/uL (ref 0.0–0.5)
Eosinophils Relative: 5 %
HCT: 35.4 % — ABNORMAL LOW (ref 36.0–46.0)
Hemoglobin: 11.1 g/dL — ABNORMAL LOW (ref 12.0–15.0)
Immature Granulocytes: 1 %
Lymphocytes Relative: 42 %
Lymphs Abs: 3.8 10*3/uL (ref 0.7–4.0)
MCH: 28.5 pg (ref 26.0–34.0)
MCHC: 31.4 g/dL (ref 30.0–36.0)
MCV: 90.8 fL (ref 80.0–100.0)
Monocytes Absolute: 1 10*3/uL (ref 0.1–1.0)
Monocytes Relative: 11 %
Neutro Abs: 3.8 10*3/uL (ref 1.7–7.7)
Neutrophils Relative %: 41 %
Platelets: 226 10*3/uL (ref 150–400)
RBC: 3.9 MIL/uL (ref 3.87–5.11)
RDW: 15.1 % (ref 11.5–15.5)
WBC: 9.1 10*3/uL (ref 4.0–10.5)
nRBC: 0 % (ref 0.0–0.2)

## 2020-11-25 LAB — COMPREHENSIVE METABOLIC PANEL WITH GFR
ALT: 13 U/L (ref 0–44)
AST: 10 U/L — ABNORMAL LOW (ref 15–41)
Albumin: 2.3 g/dL — ABNORMAL LOW (ref 3.5–5.0)
Alkaline Phosphatase: 38 U/L (ref 38–126)
Anion gap: 5 (ref 5–15)
BUN: 11 mg/dL (ref 8–23)
CO2: 32 mmol/L (ref 22–32)
Calcium: 8.6 mg/dL — ABNORMAL LOW (ref 8.9–10.3)
Chloride: 105 mmol/L (ref 98–111)
Creatinine, Ser: 0.74 mg/dL (ref 0.44–1.00)
GFR, Estimated: >60 mL/min
Glucose, Bld: 113 mg/dL — ABNORMAL HIGH (ref 70–99)
Potassium: 4.3 mmol/L (ref 3.5–5.1)
Sodium: 142 mmol/L (ref 135–145)
Total Bilirubin: 0.4 mg/dL (ref 0.3–1.2)
Total Protein: 4.9 g/dL — ABNORMAL LOW (ref 6.5–8.1)

## 2020-11-25 LAB — PROCALCITONIN: Procalcitonin: <0.1 ng/mL

## 2020-11-25 LAB — MAGNESIUM: Magnesium: 2 mg/dL (ref 1.7–2.4)

## 2020-11-25 LAB — GLUCOSE, CAPILLARY
Glucose-Capillary: 123 mg/dL — ABNORMAL HIGH (ref 70–99)
Glucose-Capillary: 104 mg/dL — ABNORMAL HIGH (ref 70–99)
Glucose-Capillary: 89 mg/dL (ref 70–99)

## 2020-11-25 LAB — C-REACTIVE PROTEIN: CRP: 1 mg/dL — ABNORMAL HIGH (ref <1.0)

## 2020-11-25 LAB — BRAIN NATRIURETIC PEPTIDE: B Natriuretic Peptide: 28.2 pg/mL (ref 0.0–100.0)

## 2020-11-25 IMAGING — US IR FLUORO GUIDE CV LINE*R*
2 series · 4 of 4 positions shown · non-contrast
Comparison: none

INDICATION: 74-year-old female with dementia and infection requiring intravenous
antibiotics. She has repeatedly pulled out every IV that has been
placed. She requires durable venous access that is well secured.
Therefore, she presents for placement of a tunneled central venous
catheter.

[Series 1: fl (-) angio · 1 of 1 slices shown]
[im 1/1]
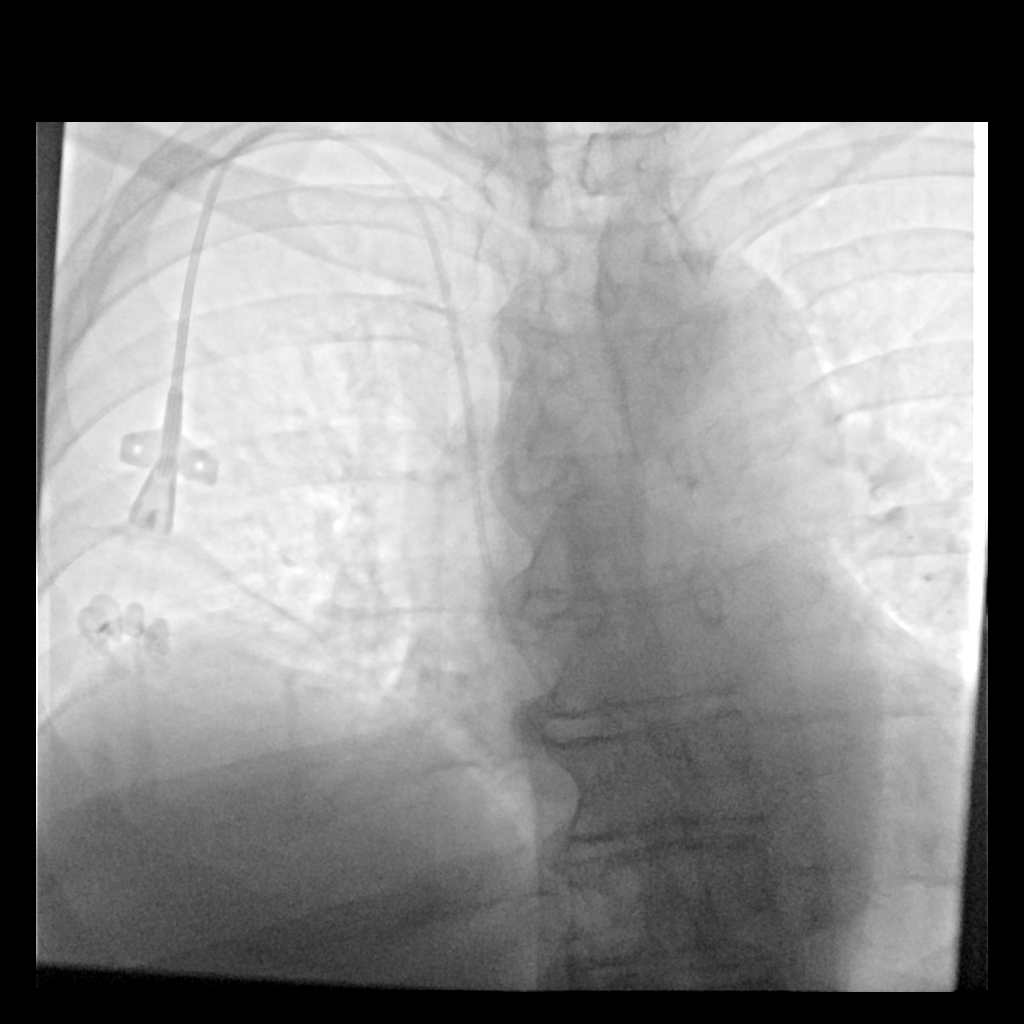

[Series 2: ir fluoro guide cv line*right* · 3 of 3 slices shown]
[im 1/3]
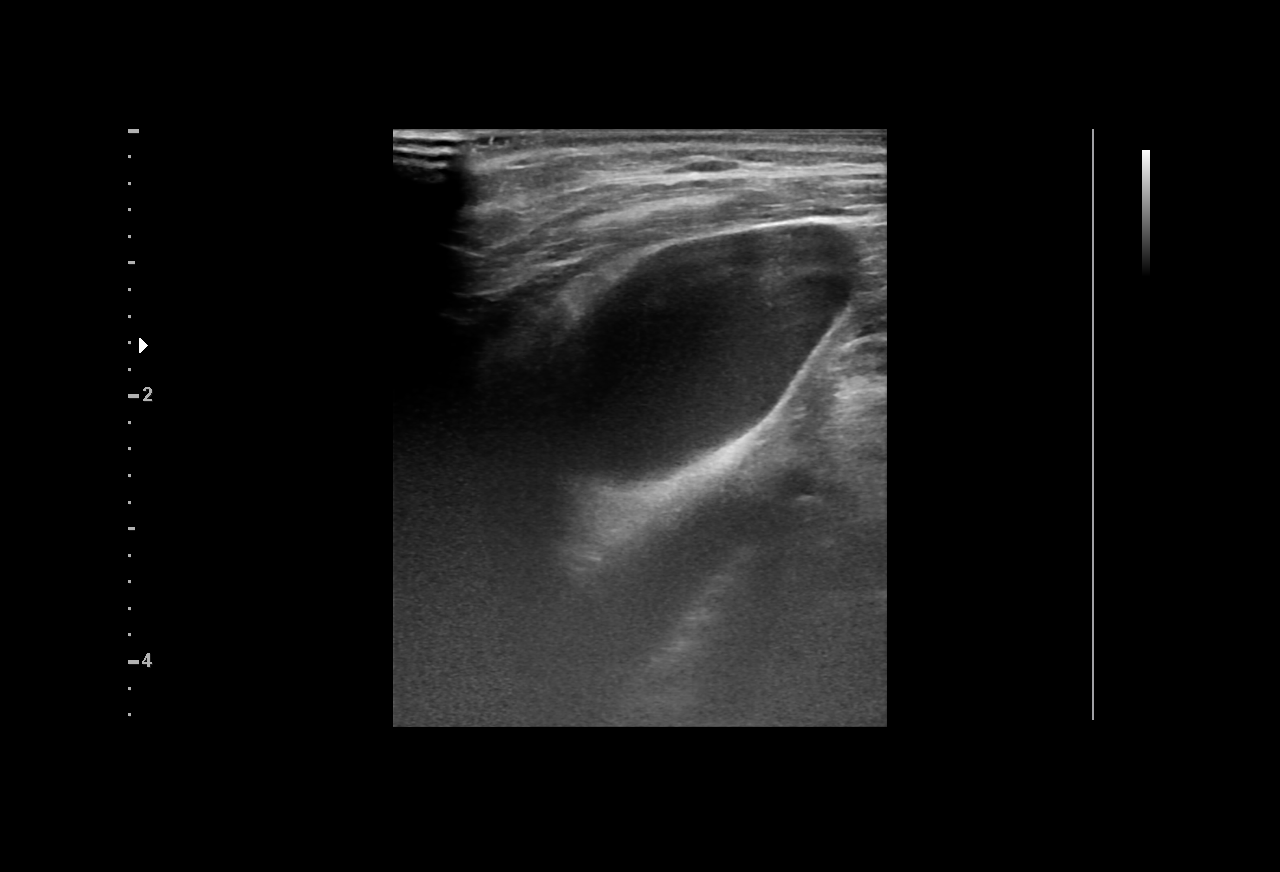
[im 2/3]
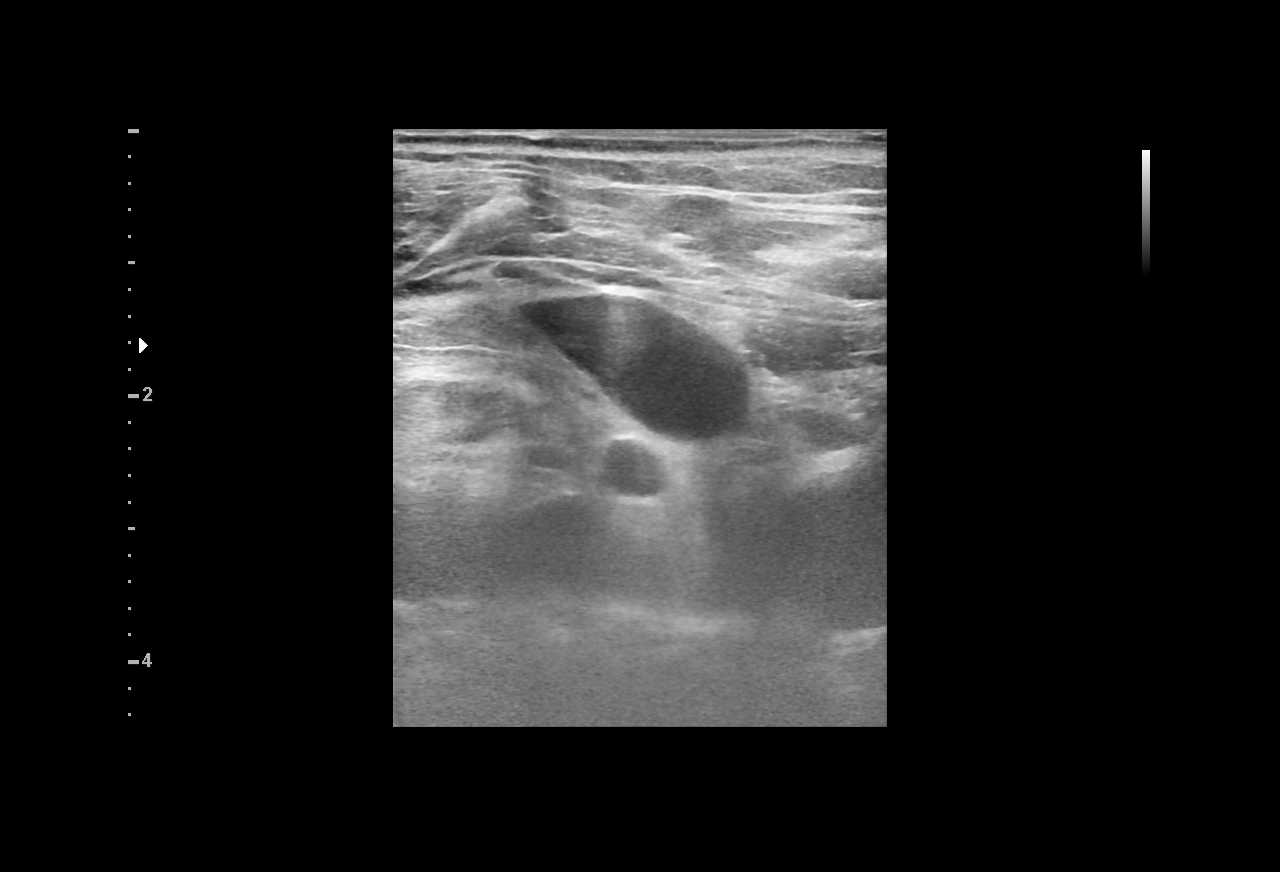
[im 3/3]
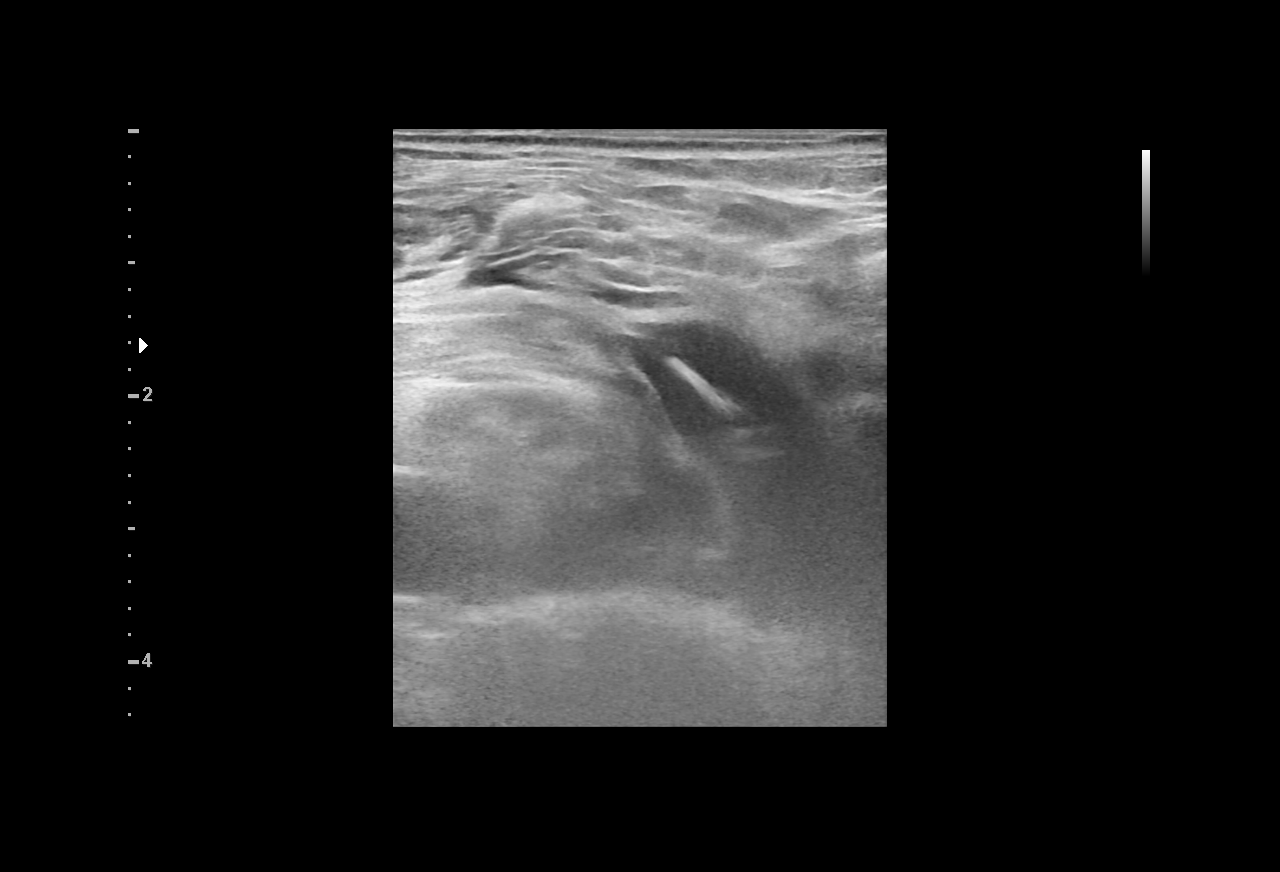

[4 of 4 positions shown; findings below may reference images not displayed]

EXAM:
IR ULTRASOUND GUIDANCE VASC ACCESS RIGHT; IR RIGHT FLUORO GUIDE CV
LINE

MEDICATIONS:
None

ANESTHESIA/SEDATION:
None

FLUOROSCOPY TIME:  Fluoroscopy Time: 0 minutes 12 seconds (5 mGy).

COMPLICATIONS:
None immediate.

PROCEDURE:
Informed written consent was obtained from the patient after a
thorough discussion of the procedural risks, benefits and
alternatives. All questions were addressed. Maximal Sterile Barrier
Technique was utilized including caps, mask, sterile gowns, sterile
gloves, sterile drape, hand hygiene and skin antiseptic. A timeout
was performed prior to the initiation of the procedure.

The right internal jugular vein was interrogated with ultrasound and
found to be widely patent. An image was obtained and stored for the
medical record. Local anesthesia was attained by infiltration with
1% lidocaine. A small dermatotomy was made. Under real-time
sonographic guidance, the vessel was punctured with a 21 gauge
micropuncture needle. Using standard technique, the initial micro
needle was exchanged over a 0.018 micro wire for a peel-away sheath.

A suitable skin exit site inferior to the clavicle was selected.
Local anesthesia was again attained by infiltration with 1%
lidocaine. A small dermatotomy was made. The tunneled central venous
catheter was then tunneled from the skin exit site to the
dermatotomy overlying the venous access site. The catheter was cut
to 22 cm (14 cm intravascular length) and advanced through the
peel-away sheath. An image was obtained for the medical record
documenting the tip of the catheter in the upper right atrium.

The catheter was then capped and flushed. The catheter was secured
to the skin at 3 locations using 0 Prolene suture. The dermatotomy
over the venous access site was closed with Dermabond. Sterile
bandages were placed.
IMPRESSION: Successful placement of a power injectable dual lumen tunneled
central venous catheter via the right internal jugular vein. The
catheter tip is in the upper right atrium and the catheter is ready
for immediate use.

## 2020-11-25 MED ORDER — RISPERIDONE 2 MG PO TBDP
2.0000 mg | ORAL_TABLET | Freq: Two times a day (BID) | ORAL | Status: DC
  Administered 2020-11-25 – 2020-12-02 (×14): 2 mg via ORAL
  Filled 2020-11-25 (×16): qty 1

## 2020-11-25 MED ORDER — CARVEDILOL 6.25 MG PO TABS
6.2500 mg | ORAL_TABLET | Freq: Two times a day (BID) | ORAL | Status: DC
  Administered 2020-11-25 – 2020-12-02 (×15): 6.25 mg via ORAL
  Filled 2020-11-25 (×16): qty 1

## 2020-11-25 MED ORDER — LIDOCAINE HCL (PF) 1 % IJ SOLN
INTRAMUSCULAR | Status: AC
  Filled 2020-11-25: qty 30

## 2020-11-25 MED ORDER — LIDOCAINE HCL (PF) 1 % IJ SOLN
INTRAMUSCULAR | Status: DC | PRN
  Administered 2020-11-25: 5 mL

## 2020-11-25 MED ORDER — LORAZEPAM 2 MG/ML IJ SOLN
1.0000 mg | Freq: Four times a day (QID) | INTRAMUSCULAR | Status: DC | PRN
  Administered 2020-11-25 – 2020-12-01 (×2): 1 mg via INTRAVENOUS
  Filled 2020-11-25 (×3): qty 1

## 2020-11-25 MED ORDER — DIPHENHYDRAMINE HCL 50 MG/ML IJ SOLN: 25.0000 mg | Freq: Two times a day (BID) | INTRAMUSCULAR | Status: DC | PRN

## 2020-11-25 MED ORDER — CARVEDILOL 6.25 MG PO TABS: 6.2500 mg | ORAL_TABLET | Freq: Two times a day (BID) | ORAL | Status: DC

## 2020-11-25 MED ORDER — DIPHENHYDRAMINE HCL 25 MG PO CAPS: 50.0000 mg | ORAL_CAPSULE | Freq: Two times a day (BID) | ORAL | Status: DC | PRN

## 2020-11-25 MED ORDER — HEPARIN SODIUM (PORCINE) 1000 UNIT/ML IJ SOLN
INTRAMUSCULAR | Status: AC
  Filled 2020-11-25: qty 1

## 2020-11-25 NOTE — Plan of Care (Signed)
  Problem: Clinical Measurements: Goal: Diagnostic test results will improve Outcome: Progressing   Problem: Nutrition: Goal: Adequate nutrition will be maintained Outcome: Progressing   Patient is adamantly refusing all PO medications, states "I will take what my husband gives me when he arrives." She has no husband listed on the chart, only a brother. Patient also refusing therapy and other interventions my team. Problem: Coping: Goal: Level of anxiety will decrease Outcome: Not Progressing   Problem: Safety: Goal: Ability to remain free from injury will improve Outcome: Not Progressing   Problem: Health Behavior/Discharge Planning: Goal: Ability to manage health-related needs will improve Outcome: Not Progressing   Problem: Activity: Goal: Risk for activity intolerance will decrease Outcome: Not Progressing

## 2020-11-25 NOTE — Progress Notes (Addendum)
Late entry from 4/13 1645 Interventional Radiology Brief Note:  PA to bedside to assess for tunneled CVC request with possible sedation due to psychiatric history.  RN at bedside placing foley catheter, patient appears cooperative.  States a PIV was placed this afternoon and tunneled CVC is not needed at this time.  Order remains in place in the event CVC needed in the future but patient currently able to get ordered medical therapy. No current plans for discharge.   Loyce Dys, MS RD PA-C 10:17 AM

## 2020-11-25 NOTE — Progress Notes (Signed)
PT Cancellation Note  Patient Details Name: Becky Hendricks MRN: [id number] DOB: 03-Sep-1945   Cancelled Treatment:    Reason Eval/Treat Not Completed: Patient declined, no reason specified On arrival, introduced PT/OT and roles. Patient stated "get out. Leave my room." Encouraged OOB mobility. Patient stated "conversation is done. Leave my room." Per RN, patient has refused all care this AM. PT will re-attempt as time allows.   Suzy Kugel A. Dan Humphreys PT, DPT Acute Rehabilitation Services Pager [phone] Office [phone]    Viviann Spare 11/25/2020, 1:28 PM

## 2020-11-25 NOTE — Consult Note (Signed)
Patient Status: [facility] - In-pt  Assessment and Plan: Sepsis in setting of MRSA bacteremia in need of 4 weeks of IV antibiotics per ID, in need of durable IV access for this (she has pulled multiple PIVs- request also to be sutured in place due to this). Plan for image-guided tunneled CVC placement today in IR pending IR scheduling. Typically, this is a non-sedation procedure. However, given patient's psychologic history and aggressive behavior, will proceed today with sedation. Patient has been NPO since 0930. Afebrile and WBCs WNL.  Risks and benefits discussed with the patient including, but not limited to bleeding, infection, or vascular injury. All of the patient's brother's questions were answered, he is agreeable to proceed. Consent obtained by patient's brother, Azlyn Wingler, via telephone- signed and in IR control room.  ______________________________________________________________________   History of Present Illness: Becky Hendricks is a 76 y.o. female with a past medical history of hypertension, HF, paroxysmal atrial fibrillation on chronic anticoagulation with Eliquis, seizure disorder, COVID-19 infection 2021, diabetes mellitus type II, osteomyelitis of stage IV sacral wound, and Bipolar disorder. She has been admitted to [facility] since 11/17/2020 from [facility] for management of progressively worsened aggressive behavior/AMS. Hospital course complicated by sepsis secondary to MRSA bacteremia. ID was consulted who recommended 4 weeks of IV antibiotics. Due to aggressive behavior/AMS, patient has had difficulties with IV access (has pulled multiple PIVs).  IR consulted by Dr. Thedore Mins for possible image-guided tunneled CVC placement (sutured in place). Patient awake and alert laying in bed watching TV. Appears calm but confused- history difficult to obtain secondary to this.   Allergies and medications reviewed.   Review of Systems: A 12 point ROS discussed and pertinent positives  are indicated in the HPI above.  All other systems are negative.  Review of Systems  Unable to perform ROS: Psychiatric disorder    Vital Signs: BP 105/86   Pulse 69   Temp 97.6 F (36.4 C) (Oral)   Resp 18   Wt 194 lb 0.1 oz (88 kg)   LMP  (LMP Unknown)   SpO2 98%   BMI 31.31 kg/m   Physical Exam Vitals and nursing note reviewed.  Constitutional:      General: She is not in acute distress. Cardiovascular:     Rate and Rhythm: Normal rate and regular rhythm.     Heart sounds: Normal heart sounds. No murmur heard.   Pulmonary:     Effort: Pulmonary effort is normal. No respiratory distress.     Breath sounds: Normal breath sounds. No wheezing.  Skin:    General: Skin is warm and dry.  Neurological:     Mental Status: She is alert.      Imaging reviewed.   Labs:  COAGS: Recent Labs    12/03/19 1332 12/20/19 1543 01/19/20 1241 02/01/20 0043 11/18/20 0154 11/18/20 1109  INR 1.7* 1.3* 1.3* 1.1  --  1.1  APTT 35  --  29  --  32  --     BMP: Recent Labs    02/01/20 0043 02/01/20 0058 04/27/20 1111 05/05/20 1355 06/27/20 0924 11/17/20 1600 11/22/20 0451 11/23/20 0528 11/24/20 0238 11/25/20 0218  NA 141   < > 140 145 142   < > 142 143 142 142  K 3.6   < > 5.0 4.1 4.2   < > 3.3* 4.1 4.1 4.3  CL 96*   < > 93* 99 98   < > 106 108 104 105  CO2 34*  --  40* 33* 35*   < > 26 29 32 32  GLUCOSE 191*   < > 103* 120* 83   < > 135* 109* 103* 113*  BUN 12   < > 29* 25* 17   < > 16 9 11 11   CALCIUM 8.6*  --  9.7 9.5 9.1   < > 9.3 8.9 8.7* 8.6*  CREATININE 0.53   < > 0.63 0.61 0.63   < > 0.67 0.58 0.72 0.74  GFRNONAA >60   < > 90 >60 90   < > >60 >60 >60 >60  GFRAA >60  --  104 >60 104  --   --   --   --   --    < > = values in this interval not displayed.       Electronically Signed: , PA-C 11/25/2020, 12:18 PM   I spent a total of 25 minutes in face to face in clinical consultation, greater than 50% of which was counseling/coordinating  care for venous access.

## 2020-11-25 NOTE — TOC Progression Note (Signed)
Transition of Care [facility]) - Progression Note    Patient Details  Name: Becky Hendricks MRN: [id number] Date of Birth: 1946-04-21  [facility] CM/SW Contact  Carley Hammed, [place] Phone Number: 11/25/2020, 10:53 AM  Clinical Narrative:    CSW contacted [name] to request a call from administration to discuss pt returning after hospitalization. CSW was told they would call today. SW will continue to follow.   Expected Discharge Plan: Skilled Nursing Facility Barriers to Discharge: Continued Medical Work up,SNF Pending bed offer  Expected Discharge Plan and Services Expected Discharge Plan: Skilled Nursing Facility       Living arrangements for the past 2 months: Skilled Nursing Facility                                       Social Determinants of Health (SDOH) Interventions    Readmission Risk Interventions No flowsheet data found.

## 2020-11-25 NOTE — Consult Note (Addendum)
[facility] Face-to-Face Psychiatry Consult   Reason for Consult: Severe bipolar disorder with psychosis Referring Physician:  Dr Thedore Mins Patient Identification: Becky Hendricks MRN:  [id number] Principal Diagnosis: Bipolar affective disorder, currently manic, mild (HCC) Diagnosis:  Principal Problem:   Bipolar affective disorder, currently manic, mild (HCC) Active Problems:   Acute metabolic encephalopathy   Severe sepsis (HCC)   AF (paroxysmal atrial fibrillation) (HCC)   Sacral wound   Chronic diastolic CHF (congestive heart failure) (HCC)   Bipolar disorder (HCC)   SIRS (systemic inflammatory response syndrome) (HCC)   Nausea and vomiting   Mixed diabetic hyperlipidemia associated with type 2 diabetes mellitus (HCC)   Type 2 diabetes mellitus with hyperglycemia, with long-term current use of insulin (HCC)   Constipation   Encephalopathy   MRSA bacteremia  Total Time spent with patient: 20 minutes    Subjective:   Becky Hendricks is a 75 y.o. female patient admitted with AMS and agitation, history of bipolar disorder. Patient is seen and chart reviewed.  On today's evaluation patient is alert and oriented, irritable and agitated, yet cooperative and willing to engage with this Clinical research associate.  Today she believes she is at a [employer], and the nursing staff is [place].  She is able to elaborate and discussed this morning's events that included her receiving as needed medication, to help with her agitation and aggression.  She describes that this most recent event has made her feel triggered,  "you are all idiots .  Giving me this medication God knows what is in it.  Only person who can give me medication is my husband.  Then they put this thing inside of me(points to her urinary catheter).  Patient remains very circumstantial in her thought process, and continues to ruminate. "  They tell me my brother is coming to visit.  My brother has many friends who have a black berets.  He is not coming to  visit.  I am 75 years old, I think I have a pretty nice body to be 75.  Sometimes her like to be naked, there is nothing wrong with being naked at 75.  "  Although patient is agitated and remains altered, she continues to be able to hold a linear conversation.  She also has limited insight as she is able to recall them placing her urinary catheter, and recites why she needs it.  She continues to decline taking her oral medication, she does have a IV in place.  At one time during her tangent she states" they gave you medication to make you shake and tremor in your hands."  She denies any hallucinations, delusional and or grandiosity. She also denies any suicidal ideations and or homicidal ideations.   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  Patient is unable to provide a history at this time due to being minimally responsive.   Past Psychiatric History: bipolar  Risk to Self:  none Risk to Others:  none Prior Inpatient Therapy:  unknown Prior Outpatient Therapy:  unknown  Past Medical History:  Past Medical History:  Diagnosis Date  . Atrial fibrillation (HCC)   . Bipolar disorder (HCC)   . Bipolar  disorder (HCC) 04/27/2020  . Clostridium difficile diarrhea 08/02/2019  . Dehydration   . Essential hypertension   . Fall 02/11/2020  . Morbid obesity (HCC)   . Osteomyelitis (HCC) 12/03/2019  . Pneumonia due to COVID-19 virus 09/15/2019  . Type 2 diabetes mellitus (HCC)   . Weakness     Past Surgical History:  Procedure Laterality Date  . INCISION AND DRAINAGE  PERIRECTAL ABSCESS Left 12/04/2019   Procedure: IRRIGATION AND DEBRIDEMENT LEFT BUTTOCK ABSCESS;  Surgeon: Violeta Gelinas, MD;  Location: [facility] OR;  Service: General;  Laterality: Left;  . INCISION AND DRAINAGE PERIRECTAL ABSCESS Left 12/10/2019   Procedure: REPEAT IRRIGATION AND DEBRIDEMENT BUTTOCK  ABSCESS;  Surgeon: Abigail Miyamoto, MD;  Location: [facility] OR;  Service: General;  Laterality: Left;   Family History:  Family History  Family history unknown: Yes   Family Psychiatric  History: unknown Social History:  Social History   Substance and Sexual Activity  Alcohol Use Not Currently     Social History   Substance and Sexual Activity  Drug Use Not Currently    Social History   Socioeconomic History  . Marital status: Single    Spouse name: Not on file  . Number of children: Not on file  . Years of education: Not on file  . Highest education level: Not on file  Occupational History  . Not on file  Tobacco Use  . Smoking status: Never Smoker  . Smokeless tobacco: Never Used  Vaping Use  . Vaping Use: Unknown  Substance and Sexual Activity  . Alcohol use: Not Currently  . Drug use: Not Currently  . Sexual activity: Not Currently    Birth control/protection: Post-menopausal  Other Topics Concern  . Not on file  Social History Narrative  . Not on file   Social Determinants of Health   Financial Resource Strain: Not on file  Food Insecurity: Not on file  Transportation Needs: Not on file  Physical Activity: Not on file  Stress: Not on file  Social Connections: Not on file   Additional Social History:    Allergies:   Allergies  Allergen Reactions  . Chlorhexidine Gluconate Itching  . Acetazolamide Er Rash    Labs:  Results for orders placed or performed during the hospital encounter of 11/17/20 (from the past 48 hour(s))  Glucose, capillary     Status: Abnormal   Collection Time: 11/23/20 12:01 PM  Result Value Ref Range   Glucose-Capillary 154 (H) 70 - 99  mg/dL    Comment: Glucose reference range applies only to samples taken after fasting for at least 8 hours.  Glucose, capillary     Status: None   Collection Time: 11/23/20  4:04 PM  Result Value Ref Range   Glucose-Capillary 96 70 - 99 mg/dL    Comment: Glucose reference range applies only to samples taken after fasting for at least 8 hours.  Glucose, capillary     Status: Abnormal   Collection Time: 11/24/20 12:22 AM  Result Value Ref Range   Glucose-Capillary 167 (H) 70 - 99 mg/dL    Comment: Glucose reference range applies only to samples taken after fasting for at least 8 hours.   Comment 1 Notify RN    Comment 2 Document in Chart   Procalcitonin     Status: None   Collection Time: 11/24/20  2:38 AM  Result Value Ref Range   Procalcitonin <0.10 ng/mL    Comment:        Interpretation: PCT (Procalcitonin) <=  0.5 ng/mL: Systemic infection (sepsis) is not likely. Local bacterial infection is possible. (NOTE)       Sepsis PCT Algorithm           Lower Respiratory Tract                                      Infection PCT Algorithm    ----------------------------     ----------------------------         PCT < 0.25 ng/mL                PCT < 0.10 ng/mL          Strongly encourage             Strongly discourage   discontinuation of antibiotics    initiation of antibiotics    ----------------------------     -----------------------------       PCT 0.25 - 0.50 ng/mL            PCT 0.10 - 0.25 ng/mL               OR       >80% decrease in PCT            Discourage initiation of                                            antibiotics      Encourage discontinuation           of antibiotics    ----------------------------     -----------------------------         PCT >= 0.50 ng/mL              PCT 0.26 - 0.50 ng/mL               AND        <80% decrease in PCT             Encourage initiation of                                             antibiotics       Encourage continuation            of antibiotics    ----------------------------     -----------------------------        PCT >= 0.50 ng/mL                  PCT > 0.50 ng/mL               AND         increase in PCT                  Strongly encourage                                      initiation of antibiotics    Strongly encourage escalation           of antibiotics                                     -----------------------------  PCT <= 0.25 ng/mL                                                 OR                                        > 80% decrease in PCT                                      Discontinue / Do not initiate                                             antibiotics  Performed at [facility], [address] [place], [place] [address]   Magnesium     Status: None   Collection Time: 11/24/20  2:38 AM  Result Value Ref Range   Magnesium 2.0 1.7 - 2.4 mg/dL    Comment: Performed at [facility], [address] [place], [place] [address]  Comprehensive metabolic panel     Status: Abnormal   Collection Time: 11/24/20  2:38 AM  Result Value Ref Range   Sodium 142 135 - 145 mmol/L   Potassium 4.1 3.5 - 5.1 mmol/L   Chloride 104 98 - 111 mmol/L   CO2 32 22 - 32 mmol/L   Glucose, Bld 103 (H) 70 - 99 mg/dL    Comment: Glucose reference range applies only to samples taken after fasting for at least 8 hours.   BUN 11 8 - 23 mg/dL   Creatinine, Ser 9.93 0.44 - 1.00 mg/dL   Calcium 8.7 (L) 8.9 - 10.3 mg/dL   Total Protein 5.0 (L) 6.5 - 8.1 g/dL   Albumin 2.4 (L) 3.5 - 5.0 g/dL   AST 11 (L) 15 - 41 U/L   ALT 16 0 - 44 U/L   Alkaline Phosphatase 41 38 - 126 U/L   Total Bilirubin 0.8 0.3 - 1.2 mg/dL   GFR, Estimated >57 >01 mL/min    Comment: (NOTE) Calculated using the CKD-EPI Creatinine Equation (2021)    Anion gap 6 5 - 15    Comment: Performed at [facility], [address] [place], [place] [address]  CBC with  Differential/Platelet     Status: Abnormal   Collection Time: 11/24/20  2:38 AM  Result Value Ref Range   WBC 7.9 4.0 - 10.5 K/uL   RBC 3.81 (L) 3.87 - 5.11 MIL/uL   Hemoglobin 11.0 (L) 12.0 - 15.0 g/dL   HCT 03.0 (L) 09.2 - 33.0 %   MCV 92.4 80.0 - 100.0 fL   MCH 28.9 26.0 - 34.0 pg   MCHC 31.3 30.0 - 36.0 g/dL   RDW 07.6 22.6 - 33.3 %   Platelets 226 150 - 400 K/uL   nRBC 0.0 0.0 - 0.2 %   Neutrophils Relative % 41 %   Neutro Abs 3.3 1.7 - 7.7 K/uL   Lymphocytes Relative 40 %   Lymphs Abs 3.1 0.7 - 4.0 K/uL   Monocytes Relative 13 %  Monocytes Absolute 1.0 0.1 - 1.0 K/uL   Eosinophils Relative 6 %   Eosinophils Absolute 0.5 0.0 - 0.5 K/uL   Basophils Relative 0 %   Basophils Absolute 0.0 0.0 - 0.1 K/uL   Immature Granulocytes 0 %   Abs Immature Granulocytes 0.03 0.00 - 0.07 K/uL    Comment: Performed at [facility], [address], [place], [place] [address]  C-reactive protein     Status: Abnormal   Collection Time: 11/24/20  2:38 AM  Result Value Ref Range   CRP 1.6 (H) <1.0 mg/dL    Comment: Performed at [facility], [address], [place], [place] [address]  Brain natriuretic peptide     Status: None   Collection Time: 11/24/20  2:38 AM  Result Value Ref Range   B Natriuretic Peptide 21.4 0.0 - 100.0 pg/mL    Comment: Performed at [facility], [address] [place], [place] [address]  Glucose, capillary     Status: Abnormal   Collection Time: 11/24/20  6:03 AM  Result Value Ref Range   Glucose-Capillary 110 (H) 70 - 99 mg/dL    Comment: Glucose reference range applies only to samples taken after fasting for at least 8 hours.  Glucose, capillary     Status: Abnormal   Collection Time: 11/24/20 12:09 PM  Result Value Ref Range   Glucose-Capillary 138 (H) 70 - 99 mg/dL    Comment: Glucose reference range applies only to samples taken after fasting for at least 8 hours.  Glucose, capillary     Status: Abnormal   Collection Time: 11/24/20  5:09  PM  Result Value Ref Range   Glucose-Capillary 120 (H) 70 - 99 mg/dL    Comment: Glucose reference range applies only to samples taken after fasting for at least 8 hours.  Glucose, capillary     Status: Abnormal   Collection Time: 11/24/20 11:41 PM  Result Value Ref Range   Glucose-Capillary 143 (H) 70 - 99 mg/dL    Comment: Glucose reference range applies only to samples taken after fasting for at least 8 hours.   Comment 1 Notify RN    Comment 2 Document in Chart   Procalcitonin     Status: None   Collection Time: 11/25/20  2:18 AM  Result Value Ref Range   Procalcitonin <0.10 ng/mL    Comment:        Interpretation: PCT (Procalcitonin) <= 0.5 ng/mL: Systemic infection (sepsis) is not likely. Local bacterial infection is possible. (NOTE)       Sepsis PCT Algorithm           Lower Respiratory Tract                                      Infection PCT Algorithm    ----------------------------     ----------------------------         PCT < 0.25 ng/mL                PCT < 0.10 ng/mL          Strongly encourage             Strongly discourage   discontinuation of antibiotics    initiation of antibiotics    ----------------------------     -----------------------------       PCT 0.25 - 0.50 ng/mL  PCT 0.10 - 0.25 ng/mL               OR       >80% decrease in PCT            Discourage initiation of                                            antibiotics      Encourage discontinuation           of antibiotics    ----------------------------     -----------------------------         PCT >= 0.50 ng/mL              PCT 0.26 - 0.50 ng/mL               AND        <80% decrease in PCT             Encourage initiation of                                             antibiotics       Encourage continuation           of antibiotics    ----------------------------     -----------------------------        PCT >= 0.50 ng/mL                  PCT > 0.50 ng/mL               AND          increase in PCT                  Strongly encourage                                      initiation of antibiotics    Strongly encourage escalation           of antibiotics                                     -----------------------------                                           PCT <= 0.25 ng/mL                                                 OR                                        > 80% decrease in PCT  Discontinue / Do not initiate                                             antibiotics  Performed at [facility], [address] [place], [place] [address]   Magnesium     Status: None   Collection Time: 11/25/20  2:18 AM  Result Value Ref Range   Magnesium 2.0 1.7 - 2.4 mg/dL    Comment: Performed at [facility], [address] [place], [place] [address]  Comprehensive metabolic panel     Status: Abnormal   Collection Time: 11/25/20  2:18 AM  Result Value Ref Range   Sodium 142 135 - 145 mmol/L   Potassium 4.3 3.5 - 5.1 mmol/L   Chloride 105 98 - 111 mmol/L   CO2 32 22 - 32 mmol/L   Glucose, Bld 113 (H) 70 - 99 mg/dL    Comment: Glucose reference range applies only to samples taken after fasting for at least 8 hours.   BUN 11 8 - 23 mg/dL   Creatinine, Ser 3.32 0.44 - 1.00 mg/dL   Calcium 8.6 (L) 8.9 - 10.3 mg/dL   Total Protein 4.9 (L) 6.5 - 8.1 g/dL   Albumin 2.3 (L) 3.5 - 5.0 g/dL   AST 10 (L) 15 - 41 U/L   ALT 13 0 - 44 U/L   Alkaline Phosphatase 38 38 - 126 U/L   Total Bilirubin 0.4 0.3 - 1.2 mg/dL   GFR, Estimated >95 >18 mL/min    Comment: (NOTE) Calculated using the CKD-EPI Creatinine Equation (2021)    Anion gap 5 5 - 15    Comment: Performed at [facility], [address], [place], [place] [address]  CBC with Differential/Platelet     Status: Abnormal   Collection Time: 11/25/20  2:18 AM  Result Value Ref Range   WBC 9.1 4.0 - 10.5 K/uL   RBC 3.90 3.87 - 5.11 MIL/uL   Hemoglobin 11.1 (L) 12.0 -  15.0 g/dL   HCT 06.3 (L) 01.6 - 01.0 %   MCV 90.8 80.0 - 100.0 fL   MCH 28.5 26.0 - 34.0 pg   MCHC 31.4 30.0 - 36.0 g/dL   RDW 93.2 35.5 - 73.2 %   Platelets 226 150 - 400 K/uL   nRBC 0.0 0.0 - 0.2 %   Neutrophils Relative % 41 %   Neutro Abs 3.8 1.7 - 7.7 K/uL   Lymphocytes Relative 42 %   Lymphs Abs 3.8 0.7 - 4.0 K/uL   Monocytes Relative 11 %   Monocytes Absolute 1.0 0.1 - 1.0 K/uL   Eosinophils Relative 5 %   Eosinophils Absolute 0.5 0.0 - 0.5 K/uL   Basophils Relative 0 %   Basophils Absolute 0.0 0.0 - 0.1 K/uL   Immature Granulocytes 1 %   Abs Immature Granulocytes 0.06 0.00 - 0.07 K/uL    Comment: Performed at [facility], [address] [place], [place] [address]  C-reactive protein     Status: Abnormal   Collection Time: 11/25/20  2:18 AM  Result Value Ref Range   CRP 1.0 (H) <1.0 mg/dL    Comment: Performed at [facility], [address] [place], [place] [address]  Brain natriuretic peptide     Status: None   Collection Time: 11/25/20  2:18 AM  Result Value Ref Range   B Natriuretic Peptide 28.2 0.0 - 100.0 pg/mL    Comment: Performed at [facility], [address], [place], [place] [address]  Glucose, capillary     Status: Abnormal   Collection Time: 11/25/20  6:01 AM  Result Value Ref Range   Glucose-Capillary 123 (H) 70 - 99 mg/dL    Comment: Glucose reference range applies only to samples taken after fasting for at least 8 hours.    Current Facility-Administered Medications  Medication Dose Route Frequency Provider Last Rate Last Admin  . acetaminophen (TYLENOL) tablet 650 mg  650 mg Oral Q6H PRN Shalhoub, Deno Lunger, MD       Or  . acetaminophen (TYLENOL) suppository 650 mg  650 mg Rectal Q6H PRN Shalhoub, Deno Lunger, MD      . amLODipine (NORVASC) tablet 5 mg  5 mg Oral Daily Leroy Sea, MD   5 mg at 11/24/20 0941  . apixaban (ELIQUIS) tablet 5 mg  5 mg Oral BID Marguerita Merles Portage, DO   5 mg at 11/24/20 2124  . atorvastatin  (LIPITOR) tablet 10 mg  10 mg Oral Daily Leroy Sea, MD   10 mg at 11/24/20 0941  . cloNIDine (CATAPRES - Dosed in mg/24 hr) patch 0.2 mg  0.2 mg Transdermal Weekly Leroy Sea, MD   0.2 mg at 11/20/20 1810  . DAPTOmycin (CUBICIN) 700 mg in sodium chloride 0.9 % IVPB  8 mg/kg Intravenous Q2000 Leroy Sea, MD 128 mL/hr at 11/24/20 2120 700 mg at 11/24/20 2120  . divalproex (DEPAKOTE) DR tablet 500 mg  500 mg Oral Q12H Leroy Sea, MD   500 mg at 11/25/20 0556  . docusate sodium (COLACE) capsule 200 mg  200 mg Oral BID Susa Raring K, MD   200 mg at 11/24/20 2124  . haloperidol (HALDOL) tablet 10 mg  10 mg Oral TID Maryagnes Amos, FNP   10 mg at 11/24/20 2124   Or  . haloperidol lactate (HALDOL) injection 10 mg  10 mg Intravenous TID Maryagnes Amos, FNP   10 mg at 11/25/20 0827  . haloperidol lactate (HALDOL) injection 5 mg  5 mg Intravenous Q6H PRN Maryagnes Amos, FNP      . hydrALAZINE (APRESOLINE) injection 10 mg  10 mg Intravenous Q6H PRN Leroy Sea, MD   10 mg at 11/21/20 1220  . insulin aspart (novoLOG) injection 0-15 Units  0-15 Units Subcutaneous Q6H Shalhoub, Deno Lunger, MD   2 Units at 11/25/20 [phone]  . LORazepam (ATIVAN) injection 1 mg  1 mg Intravenous Q8H PRN Leroy Sea, MD      . multivitamin with minerals tablet 1 tablet  1 tablet Oral Daily Leroy Sea, MD   1 tablet at 11/24/20 0940  . ondansetron (ZOFRAN) injection 4 mg  4 mg Intravenous Q6H PRN Shalhoub, Deno Lunger, MD      . pantoprazole (PROTONIX) EC tablet 40 mg  40 mg Oral Daily Leroy Sea, MD   40 mg at 11/24/20 0940  . polyethylene glycol (MIRALAX / GLYCOLAX) packet 17 g  17 g Oral BID Leroy Sea, MD   17 g at 11/24/20 2124  . QUEtiapine (SEROQUEL) tablet 12.5 mg  12.5 mg Oral BID PRN Charm Rings, NP   12.5 mg at 11/22/20 2240  . tamsulosin (FLOMAX) capsule 0.4 mg  0.4 mg Oral Daily Leroy Sea, MD   0.4 mg at  11/24/20 1421     Musculoskeletal: Strength & Muscle Tone: within normal limits Gait & Station: did not witness Patient leans: N/A  Psychiatric Specialty Exam: Physical Exam Vitals and nursing note reviewed.  Constitutional:      Appearance: Normal appearance.  HENT:     Head: Normocephalic.     Nose: Nose normal.  Pulmonary:     Effort: Pulmonary effort is normal.  Neurological:     General: No focal deficit present.     Mental Status: She is alert.  Psychiatric:        Attention and Perception: She is inattentive.        Mood and Affect: Mood is anxious. Affect is angry.        Speech: Speech normal.        Behavior: Behavior is agitated.        Thought Content: Thought content normal.        Cognition and Memory: Cognition is impaired.        Judgment: Judgment is impulsive.     Review of Systems  Psychiatric/Behavioral: Positive for memory loss. The patient is nervous/anxious.   All other systems reviewed and are negative.   Blood pressure 105/86, pulse 69, temperature 97.6 F (36.4 C), temperature source Oral, resp. rate 18, weight 88 kg, SpO2 98 %.Body mass index is 31.31 kg/m.  General Appearance: Casual  Eye Contact:  Minimal  Speech:  Clear and Coherent and Pressured  Volume:  Normal  Mood:  Angry, Anxious and Irritable  Affect:  Blunt, Labile and Full Range  Thought Process:  Coherent, Linear and Descriptions of Associations: Circumstantial  Orientation:  Full (Time, Place, and Person)  Thought Content:  Logical, Delusions, Ideas of Reference:   Delusions, Rumination and Tangential  Suicidal Thoughts:  No  Homicidal Thoughts:  No  Memory:  Immediate;   Fair Recent;   Fair  Judgement:  Poor  Insight:  Shallow  Psychomotor Activity:  Normal  Concentration:  Concentration: Fair and Attention Span: Fair  Recall:  [employer]  Fund of Knowledge:  Fair  Language:  Fair  Akathisia:  No  Handed:  Right  AIMS (if indicated):     Assets:  Housing Leisure  Time Resilience Social Support  ADL's:  Impaired  Cognition:  Impaired,  Moderate  Sleep:      Physical Exam: Physical Exam Vitals and nursing note reviewed.  Constitutional:      Appearance: Normal appearance.  HENT:     Head: Normocephalic.     Nose: Nose normal.  Pulmonary:     Effort: Pulmonary effort is normal.  Neurological:     General: No focal deficit present.     Mental Status: She is alert.  Psychiatric:        Attention and Perception: She is inattentive.        Mood and Affect: Mood is anxious. Affect is angry.        Speech: Speech normal.        Behavior: Behavior is agitated.        Thought Content: Thought content normal.        Cognition and Memory: Cognition is impaired.        Judgment: Judgment is impulsive.    Review of Systems  Psychiatric/Behavioral: Positive for memory loss. The patient is nervous/anxious.   All other systems reviewed and are negative.  Blood pressure 105/86, pulse 69, temperature 97.6 F (36.4 C), temperature source Oral, resp. rate 18, weight 88 kg,  SpO2 98 %. Body mass index is 31.31 kg/m.  Treatment Plan Summary: Suspected acute encephalopathy in the setting of underlying Bipolar disorder. Patient is presenting as psychotic and manic, this could be due to recent her ongoing MRSA infection or osteomyelitis. As we know any underlying infection can disrupt mental status in our geriatric population.  Her mentation continues to wax and wane, compared to yesterday she seems to have no improvement in her psychiatric symptoms.  She remains very delusional, paranoid at times, irrational, and appears to be experiencing flashbacks.  She currently believes that the nursing staff are from [place], and are out to get her. Suspect her cognitive impairment will lag behind her medical conditions. She now has a urinary catheter, due to inability to void for multiple days.   Will adjust medications appropriately.    Bipolar affective disorder,  mania, mild: patient is currently refusing her medications at this time. Will continue to recommend offering her oral medications first, if she declines may give IV.  Patient declines taking her medications to this provider, however per [facility] patient is taking medications as prescribed.  In the event she continues to refuse medication, patient may benefit from force meds in order to achieve optimal stabilization with medication.   -Continue Depakote 500 mg BID.  Originally planned to order valproic acid level today, however patient has been refusing medications throughout this admission, therefore level would not be accurate.  Will need Depakote level drawn after 4 days.     -Patient's QTC has increased to 510 on today's date.  Will need to refrain from administration of most antipsychotics at this time.  Will attempt to maximize her Depakote in order to target agitation, aggression, and mood stabilization. Will start Risperdal M tab 2mg  po BID for psychosis and delusions.  -Will continue serial daily EKGs as she remains at risk for prolonged QTc. (457>510).  -Continue one-to-one Recruitment consultant.  -We will add Benadryl 50 mg p.o./25 mg IV twice daily prn for sedation, anxiety, and agitation.   Disposition: Supportive therapy provided about ongoing stressors.  Maryagnes Amos, FNP 11/25/2020 8:58 AM

## 2020-11-25 NOTE — Progress Notes (Signed)
OT Cancellation Note  Patient Details Name: Becky Hendricks MRN: [id number] DOB: 26-Aug-1945   Cancelled Treatment:    Reason Eval/Treat Not Completed: Patient declined, no reason specified;Other (comment) pt adamantly declining OT session asking this COTA to leave her room. Pt declined OOB mobility or sitting in recliner. Will check back as time allows for OT session.   Lenor Derrick., COTA/L [facility] [phone]   Barron Schmid 11/25/2020, 1:30 PM

## 2020-11-25 NOTE — Progress Notes (Signed)
PROGRESS NOTE                                                                                                                                                                                                             Patient Demographics:    Becky Hendricks, is a 75 y.o. female, DOB - 10-11-1945, FWY:[id number]  Outpatient Primary MD for the patient is [name], MD    LOS - 7  Admit date - 11/17/2020    Chief Complaint  Patient presents with  . ivc  . 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  So far LP, CT scan chest abdomen pelvis nonacute, MRI with questionable occipital lobe changes.   Subjective:   Patient in bed slightly agitated but appears comfortable, denies any headache chest or abdominal pain.  No focal weakness.    Assessment  & Plan :   1.  Acute toxic encephalopathy gradually progressive at SNF - MRI with ?  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  2. Sepsis in the setting of MRSA bacteremia - ID consulted, on Vancomycin >> Dapto, TTE stable, per ID no need for TEE, repeat cultures stable thus far.  Per ID  total of 4 weeks of IV antibiotics, tunneled PICC line requested by IR on 11/23/2020 as she has pulled multiple IVs.  3.  Bipolar disorder.  Currently on combination of Depakote and Haldol, per Psych Seroquel held.  Psych has seen the patient, request continued follow-up.  According to the brother she had stopped taking her psych medications 2 months ago, her main issue this admission seems to be largely psych disorder per neurology, will defer management to psych request Psych to follow daily.  4. H/O Sacral Osteomyelitis with healed Scaral Decub Ulcer - continue nursing wound care, stable ESR CRP and procalcitonin as well.   5. N&V with stool ball - supportive Rx, bowel regimen.   6.  Paroxysmal A. fib.  Mali vas 2 score of greater than 3.  Currently on Eliquis, not on any rate controlling agents.  7.  Dyslipidemia.  On statin.  8.  Chronic diastolic CHF EF 81%.  Currently compensated.  9.  Obesity.  BMI of 31.  Supportive care follow-up with PCP for weight loss.  10.  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 Vanco stopped 11/20/20.   11. ? MRI changes DW Neuro Dr Rory Percy, likely PRESS - BP now in good control.  12.  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  13.  Prolonged QTC.  Likely due to her psych medications, electrolytes stable, placed on Coreg, will monitor closely on EKG.  14. DM type II.  For now sliding scale.  Lab Results  Component Value Date   HGBA1C 6.1 (H) 11/18/2020   CBG (last 3)  Recent Labs    11/24/20 1709 11/24/20 2341 11/25/20 0601  GLUCAP  120* 143* 123*        Condition - Fair  Family Communication  : Brother [name] [phone] over the phone on 11/20/2020 at 9:36 AM, 11/21/20, discussed clearly patient's refusal to take medications, has been refusing taking her psych medications for several months.  If significant decline he will consider comfort measures.  For now we are trying her best to optimize medication intake and improve her mentation, 11/24/20  Code Status :  Full  Consults  :  Neuro, Psych, ID  PUD Prophylaxis : PPI   Procedures  :     MRI - Ill-defined patchy hyperintensity in the left occipital parietal lobe without restricted diffusion or abnormal enhancement. Differential diagnosis includes late subacute infarct, encephalitis.   LP - non acute  CT - 1. Main pulmonary artery measures at the upper limits of normal. Correlate with pulmonary hypertension. 2. Otherwise no acute intrathoracic abnormality. 3. Cholelithiasis with no CT findings of acute cholecystitis. 4. An 8 cm stool ball within the rectum with no associated findings suggest of stercoral colitis. 5. Grossly stable in size uterine fibroids  TTE - 1. Limited study due to poor acoustic windows. Left ventricular ejection fraction, by estimation, is 60 to 65%. The left ventricle has normal function. The left ventricle has no regional wall motion abnormalities. There is mild concentric left ventricular hypertrophy. Left ventricular diastolic parameters are consistent with Grade I diastolic dysfunction (impaired relaxation).  2. Right ventricular systolic function is mildly reduced. The right ventricular size is mildly-to-moderately enlarged.  3. The mitral valve is normal in structure. Trivial mitral valve regurgitation. No evidence of mitral stenosis.  4. The aortic valve is tricuspid. There is mild calcification of the aortic valve. There is mild thickening of the aortic valve. Aortic valve regurgitation is not visualized. Mild aortic valve sclerosis  is  present, with no evidence of aortic valve stenosis.  5. No valvular vegetations visualized on surface echo      Disposition Plan  :    Status is: Inpatient  Remains inpatient appropriate because:IV treatments appropriate due to intensity of illness or inability to take PO   Dispo: The patient is from: SNF              Anticipated d/c is to: SNF              Patient currently is not medically stable to d/c.   Difficult to place patient No   DVT Prophylaxis  :  Eliquis  Lab Results  Component Value Date   PLT 226 11/25/2020    Diet :  Diet Order            Diet NPO time specified Except for: Sips with Meds  Diet effective now                  Inpatient Medications  Scheduled Meds: . amLODipine  5 mg Oral Daily  . apixaban  5 mg Oral BID  . atorvastatin  10 mg Oral Daily  . carvedilol  6.25 mg Oral BID WC  . divalproex  500 mg Oral Q12H  . docusate sodium  200 mg Oral BID  . insulin aspart  0-15 Units Subcutaneous Q6H  . multivitamin with minerals  1 tablet Oral Daily  . pantoprazole  40 mg Oral Daily  . polyethylene glycol  17 g Oral BID  . risperiDONE  2 mg Oral BID  . tamsulosin  0.4 mg Oral Daily   Continuous Infusions: . DAPTOmycin (CUBICIN)  IV 700 mg (11/24/20 2120)   PRN Meds:.acetaminophen **OR** acetaminophen, diphenhydrAMINE **OR** diphenhydrAMINE, hydrALAZINE, LORazepam, [DISCONTINUED] ondansetron **OR** ondansetron (ZOFRAN) IV  Antibiotics  :    Anti-infectives (From admission, onward)   Start     Dose/Rate Route Frequency Ordered Stop   11/20/20 2000  DAPTOmycin (CUBICIN) 700 mg in sodium chloride 0.9 % IVPB        8 mg/kg  88 kg 128 mL/hr over 30 Minutes Intravenous Daily 11/20/20 1657 12/16/20 2359   11/18/20 2200  vancomycin (VANCOREADY) IVPB 1250 mg/250 mL  Status:  Discontinued        1,250 mg 166.7 mL/hr over 90 Minutes Intravenous Every 24 hours 11/17/20 2345 11/20/20 1644   11/18/20 0000  ampicillin (OMNIPEN) 2 g in sodium chloride  0.9 % 100 mL IVPB  Status:  Discontinued        2 g 300 mL/hr over 20 Minutes Intravenous Every 4 hours 11/17/20 2319 11/18/20 1520   11/17/20 2345  vancomycin (VANCOCIN) IVPB 1000 mg/200 mL premix        1,000 mg 200 mL/hr over 60 Minutes Intravenous  Once 11/17/20 2331 11/18/20 0155   11/17/20 2330  cefTRIAXone (ROCEPHIN) 2 g in sodium chloride 0.9 % 100 mL IVPB  Status:  Discontinued        2 g 200 mL/hr over 30 Minutes Intravenous Every 12 hours 11/17/20 2319 11/18/20 1520       Time Spent in minutes  30   Lala Lund M.D on 11/25/2020 at 11:55 AM  To page go to [email or web address]   [facility] -  Office  [phone]    See all Orders from today for further details    Objective:   Vitals:   11/24/20 1715 11/24/20 2011 11/24/20 2343 11/25/20 0323  BP: (!) 109/57 127/61 (!) 145/74  105/86  Pulse: 76 84 75 69  Resp: _0 Temp: 98.3 F (36.8 C) 97.9 F (36.6 C) (!) 97.5 F (36.4 C) 97.6 F (36.4 C)  TempSrc: Oral Oral Oral Oral  SpO2: 97% 97% 97% 98%  Weight:        Wt Readings from Last 3 Encounters:  11/20/20 88 kg  10/12/20 88.5 kg  05/06/20 92.5 kg     Intake/Output Summary (Last 24 hours) at 11/25/2020 1155 Last data filed at 11/25/2020 5462 Gross per 24 hour  Intake 796 ml  Output 1925 ml  Net -1129 ml     Physical Exam  Awake but confused and slightly agitated, no focal deficits, diffuse macular rash on arms and trunk much improved, Circle.AT,PERRAL Supple Neck,No JVD, No cervical lymphadenopathy appriciated.  Symmetrical Chest wall movement, Good air movement bilaterally, CTAB RRR,No Gallops, Rubs or new Murmurs, No Parasternal Heave +ve B.Sounds, Abd Soft, No tenderness, No organomegaly appriciated, No rebound - guarding or rigidity. No Cyanosis,       Data Review:    CBC Recent Labs  Lab 11/21/20 0245 11/22/20 0451 11/23/20 0528 11/24/20 0238 11/25/20 0218  WBC 9.5 8.2 9.7 7.9 9.1  HGB 11.8* 12.4 12.0 11.0* 11.1*  HCT  36.3 38.4 37.2 35.2* 35.4*  PLT 233 238 237 226 226  MCV 88.1 89.9 91.0 92.4 90.8  MCH 28.6 29.0 29.3 28.9 28.5  MCHC 32.5 32.3 32.3 31.3 31.4  RDW 14.2 14.8 15.1 15.2 15.1  LYMPHSABS 2.2 2.9 3.3 3.1 3.8  MONOABS 1.0 0.9 1.2* 1.0 1.0  EOSABS 0.5 0.6* 0.5 0.5 0.5  BASOSABS 0.0 0.0 0.0 0.0 0.0    Recent Labs  Lab 11/21/20 0245 11/22/20 0451 11/23/20 0528 11/24/20 0238 11/25/20 0218  NA 141 142 143 142 142  K 3.6 3.3* 4.1 4.1 4.3  CL 106 106 108 104 105  CO2 _1 32 32  GLUCOSE 139* 135* 109* 103* 113*  BUN _2 CREATININE 0.76 0.67 0.58 0.72 0.74  CALCIUM 9.0 9.3 8.9 8.7* 8.6*  AST 12* 11* 12* 11* 10*  ALT _3 ALKPHOS 38 41 38 41 38  BILITOT 0.7 0.7 0.8 0.8 0.4  ALBUMIN 2.7* 2.8* 2.6* 2.4* 2.3*  MG 1.7 1.9 1.9 2.0 2.0  CRP 1.2* 1.5* 1.6* 1.6* 1.0*  PROCALCITON <0.10 <0.10 <0.10 <0.10 <0.10  BNP 47.7 28.1 15.6 21.4 28.2    ------------------------------------------------------------------------------------------------------------------ No results for input(s): CHOL, HDL, LDLCALC, TRIG, CHOLHDL, LDLDIRECT in the last 72 hours.  Lab Results  Component Value Date   HGBA1C 6.1 (H) 11/18/2020   ------------------------------------------------------------------------------------------------------------------ No results for input(s): TSH, T4TOTAL, T3FREE, THYROIDAB in the last 72 hours.  Invalid input(s): FREET3  Cardiac Enzymes No results for input(s): CKMB, TROPONINI, MYOGLOBIN in the last 168 hours.  Invalid input(s): CK ------------------------------------------------------------------------------------------------------------------    Component Value Date/Time   BNP 28.2 11/25/2020 0218     Radiology Reports CT ANGIO HEAD W OR WO CONTRAST  Result Date: 11/21/2020 CLINICAL DATA:  Initial evaluation for neuro deficit, stroke suspected. EXAM: CT ANGIOGRAPHY HEAD AND NECK TECHNIQUE: Multidetector CT imaging of the head and neck was  performed using the standard protocol during bolus administration of intravenous contrast. Multiplanar CT image reconstructions and MIPs were obtained to evaluate the vascular anatomy. Carotid stenosis measurements (when applicable) are obtained utilizing NASCET criteria, using the distal internal carotid diameter as the denominator. CONTRAST:  29m OMNIPAQUE IOHEXOL 350 MG/ML SOLN  COMPARISON:  Prior MRI from 11/19/2020. FINDINGS: CT HEAD FINDINGS Brain: Age-related cerebral atrophy with mild chronic small vessel ischemic disease. Recently identified signal changes involving the left parieto-occipital region not visible by CT. No acute intracranial hemorrhage. No acute large vessel territory infarct. No mass lesion or midline shift. No hydrocephalus or extra-axial fluid collection. Vascular: No hyperdense vessel. Scattered vascular calcifications noted within the carotid siphons. Skull: Scalp soft tissues and calvarium within normal limits. Sinuses: Scattered mucosal thickening noted within the ethmoidal air cells. Small air-fluid level noted within the right sphenoid sinus. Paranasal sinuses are otherwise clear. No mastoid effusion. Orbits: Globes and orbital soft tissues within normal limits. Review of the MIP images confirms the above findings CTA NECK FINDINGS Aortic arch: Visualized aortic arch within normal limits for caliber with normal 3 vessel morphology. No hemodynamically significant stenosis about the origin the great vessels. Right carotid system: Right common and internal carotid arteries widely patent without stenosis, dissection or occlusion. Left carotid system: Left common and internal carotid arteries widely patent without stenosis, dissection or occlusion. Vertebral arteries: Both vertebral arteries arise from subclavian arteries. No proximal subclavian artery stenosis. Moderate narrowing of the left V2 segment at the level of C3 related to an extrinsic compression from adjacent uncovertebral  disease. Vertebral arteries otherwise widely patent without stenosis, dissection or occlusion. Skeleton: No acute osseous finding. No discrete or worrisome osseous lesions. Hyperostosis frontalis interna noted. Other neck: No other acute soft tissue abnormality within the neck. No mass or adenopathy. Upper chest: Which lies upper chest demonstrates no acute finding. Review of the MIP images confirms the above findings CTA HEAD FINDINGS Anterior circulation: Both internal carotid arteries widely patent to the termini without stenosis. A1 segments widely patent. Normal anterior communicating artery complex. Both anterior cerebral arteries widely patent to their distal aspects without stenosis. No M1 stenosis or occlusion. Normal MCA bifurcations. Distal MCA branches well perfused and symmetric. Posterior circulation: Both V4 segments patent to the vertebrobasilar junction without stenosis. Neither PICA origin well visualized. Basilar widely patent to its distal aspect without stenosis. Superior cerebellar arteries patent bilaterally. Both PCAs primarily supplied via the basilar and are well perfused to there distal aspects. Venous sinuses: Patent allowing for timing contrast bolus. Anatomic variants: None significant.  No intracranial aneurysm. Review of the MIP images confirms the above findings IMPRESSION: CT HEAD IMPRESSION: 1. No acute intracranial abnormality. Recently identified signal changes at the left parieto-occipital region not visible by CT. 2. Age-related cerebral atrophy with mild chronic small vessel ischemic disease. CTA HEAD AND NECK IMPRESSION: Negative CTA of the head and neck. No large vessel occlusion, hemodynamically significant stenosis, or other acute vascular abnormality. Electronically Signed   By: Jeannine Boga M.D.   On: 11/21/2020 23:22   DG Abd 1 View  Result Date: 11/21/2020 CLINICAL DATA:  Nausea EXAM: ABDOMEN - 1 VIEW COMPARISON:  None. FINDINGS: There is diffuse stool  throughout colon. No bowel dilatation or air-fluid level to suggest bowel obstruction. No free air. There are pelvic calcifications consistent with phleboliths. Visualized lung bases clear. IMPRESSION: Diffuse stool throughout colon. Question a degree of underlying constipation. No bowel obstruction or free air. Electronically Signed   By: Lowella Grip III M.D.   On: 11/21/2020 12:18   CT HEAD WO CONTRAST  Result Date: 11/17/2020 CLINICAL DATA:  Altered mental status. EXAM: CT HEAD WITHOUT CONTRAST TECHNIQUE: Contiguous axial images were obtained from the base of the skull through the vertex without intravenous contrast. COMPARISON:  May 05, 2020 FINDINGS: Brain: There is mild cerebral atrophy with widening of the extra-axial spaces and ventricular dilatation. There are areas of decreased attenuation within the white matter tracts of the supratentorial brain, consistent with microvascular disease changes. Vascular: No hyperdense vessel or unexpected calcification. Skull: Normal. Negative for fracture or focal lesion. Sinuses/Orbits: Mild bilateral ethmoid sinus mucosal thickening is seen. Other: The study is limited secondary to patient motion. IMPRESSION: 1. Generalized cerebral atrophy. 2. No acute intracranial abnormality. Electronically Signed   By: Virgina Norfolk M.D.   On: 11/17/2020 17:17   CT ANGIO NECK W OR WO CONTRAST  Result Date: 11/21/2020 CLINICAL DATA:  Initial evaluation for neuro deficit, stroke suspected. EXAM: CT ANGIOGRAPHY HEAD AND NECK TECHNIQUE: Multidetector CT imaging of the head and neck was performed using the standard protocol during bolus administration of intravenous contrast. Multiplanar CT image reconstructions and MIPs were obtained to evaluate the vascular anatomy. Carotid stenosis measurements (when applicable) are obtained utilizing NASCET criteria, using the distal internal carotid diameter as the denominator. CONTRAST:  76m OMNIPAQUE IOHEXOL 350 MG/ML SOLN  COMPARISON:  Prior MRI from 11/19/2020. FINDINGS: CT HEAD FINDINGS Brain: Age-related cerebral atrophy with mild chronic small vessel ischemic disease. Recently identified signal changes involving the left parieto-occipital region not visible by CT. No acute intracranial hemorrhage. No acute large vessel territory infarct. No mass lesion or midline shift. No hydrocephalus or extra-axial fluid collection. Vascular: No hyperdense vessel. Scattered vascular calcifications noted within the carotid siphons. Skull: Scalp soft tissues and calvarium within normal limits. Sinuses: Scattered mucosal thickening noted within the ethmoidal air cells. Small air-fluid level noted within the right sphenoid sinus. Paranasal sinuses are otherwise clear. No mastoid effusion. Orbits: Globes and orbital soft tissues within normal limits. Review of the MIP images confirms the above findings CTA NECK FINDINGS Aortic arch: Visualized aortic arch within normal limits for caliber with normal 3 vessel morphology. No hemodynamically significant stenosis about the origin the great vessels. Right carotid system: Right common and internal carotid arteries widely patent without stenosis, dissection or occlusion. Left carotid system: Left common and internal carotid arteries widely patent without stenosis, dissection or occlusion. Vertebral arteries: Both vertebral arteries arise from subclavian arteries. No proximal subclavian artery stenosis. Moderate narrowing of the left V2 segment at the level of C3 related to an extrinsic compression from adjacent uncovertebral disease. Vertebral arteries otherwise widely patent without stenosis, dissection or occlusion. Skeleton: No acute osseous finding. No discrete or worrisome osseous lesions. Hyperostosis frontalis interna noted. Other neck: No other acute soft tissue abnormality within the neck. No mass or adenopathy. Upper chest: Which lies upper chest demonstrates no acute finding. Review of the MIP  images confirms the above findings CTA HEAD FINDINGS Anterior circulation: Both internal carotid arteries widely patent to the termini without stenosis. A1 segments widely patent. Normal anterior communicating artery complex. Both anterior cerebral arteries widely patent to their distal aspects without stenosis. No M1 stenosis or occlusion. Normal MCA bifurcations. Distal MCA branches well perfused and symmetric. Posterior circulation: Both V4 segments patent to the vertebrobasilar junction without stenosis. Neither PICA origin well visualized. Basilar widely patent to its distal aspect without stenosis. Superior cerebellar arteries patent bilaterally. Both PCAs primarily supplied via the basilar and are well perfused to there distal aspects. Venous sinuses: Patent allowing for timing contrast bolus. Anatomic variants: None significant.  No intracranial aneurysm. Review of the MIP images confirms the above findings IMPRESSION: CT HEAD IMPRESSION: 1. No acute intracranial abnormality. Recently identified signal changes at the  left parieto-occipital region not visible by CT. 2. Age-related cerebral atrophy with mild chronic small vessel ischemic disease. CTA HEAD AND NECK IMPRESSION: Negative CTA of the head and neck. No large vessel occlusion, hemodynamically significant stenosis, or other acute vascular abnormality. Electronically Signed   By: Jeannine Boga M.D.   On: 11/21/2020 23:22   CT Chest W Contrast  Result Date: 11/17/2020 CLINICAL DATA:  Fever, altered mental status.  Nausea and vomiting. EXAM: CT CHEST, ABDOMEN, AND PELVIS WITH CONTRAST TECHNIQUE: Multidetector CT imaging of the chest, abdomen and pelvis was performed following the standard protocol during bolus administration of intravenous contrast. CONTRAST:  125m OMNIPAQUE IOHEXOL 300 MG/ML  SOLN COMPARISON:  CT abdomen pelvis 12/20/2019, CT angiography chest 12/24/2019 FINDINGS: CT CHEST FINDINGS Cardiovascular: Normal heart size. No  significant pericardial effusion. The thoracic aorta is normal in caliber. Mild atherosclerotic plaque of the thoracic aorta. The main pulmonary artery measures at the upper limits of normal: 3.2 cm. Mediastinum/Nodes: No enlarged mediastinal, hilar, or axillary lymph nodes. Thyroid gland, trachea, and esophagus demonstrate no significant findings. Lungs/Pleura: Right lower lobe atelectasis with persistent elevated right hemidiaphragm. Limited evaluation for pulmonary nodule due to respiratory motion artifact. No pulmonary mass. No focal consolidation. No pleural effusion. No pneumothorax. Musculoskeletal: No chest wall abnormality. No suspicious lytic or blastic osseous lesions. No acute displaced fracture. Multilevel degenerative changes of the spine. CT ABDOMEN PELVIS FINDINGS Hepatobiliary: No focal liver abnormality. Gallstone noted within the gallbladder lumen. No gallbladder wall thickening or pericholecystic fluid. No biliary dilatation. Pancreas: No focal lesion. Normal pancreatic contour. No surrounding inflammatory changes. No main pancreatic ductal dilatation. Spleen: Normal in size without focal abnormality. Adrenals/Urinary Tract: No adrenal nodule bilaterally. Bilateral kidneys enhance symmetrically. Exophytic hypodense 2.2 cm lesion within the right kidney likely represent simple renal cysts. Slightly increased density likely due to streak artifact at this level. No hydronephrosis. No hydroureter. The urinary bladder is unremarkable. On delayed imaging, there is no urothelial wall thickening and there are no filling defects in the opacified portions of the bilateral collecting systems or ureters. Stomach/Bowel: Stomach is within normal limits. No evidence of bowel wall thickening or dilatation. 8 cm stool ball within the rectum. The appendix not definitely identified. Vascular/Lymphatic: No abdominal aorta or iliac aneurysm. Mild atherosclerotic plaque of the aorta and its branches. No abdominal,  pelvic, or inguinal lymphadenopathy. Reproductive: Difficult to measure grossly stable uterine fibroids. No adnexal lesion. Other: No intraperitoneal free fluid. No intraperitoneal free gas. No organized fluid collection. Musculoskeletal: Tiny fat containing left inguinal hernia. No suspicious lytic or blastic osseous lesions. No acute displaced fracture. Multilevel degenerative changes of the spine. IMPRESSION: 1. Main pulmonary artery measures at the upper limits of normal. Correlate with pulmonary hypertension. 2. Otherwise no acute intrathoracic abnormality. 3. Cholelithiasis with no CT findings of acute cholecystitis. 4. An 8 cm stool ball within the rectum with no associated findings suggest of stercoral colitis. 5. 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 Electronically Signed   By: MIven FinnM.D.   On: 11/17/2020 20:07   MR BRAIN W WO CONTRAST  Result Date: 11/19/2020 CLINICAL DATA:  Mental status change.  Encephalopathy EXAM: MRI HEAD WITHOUT AND WITH CONTRAST TECHNIQUE: Multiplanar, multiecho pulse sequences of the brain and surrounding structures were obtained without and with intravenous contrast. CONTRAST:  7.550mGADAVIST GADOBUTROL 1 MMOL/ML IV SOLN COMPARISON:  CT head 11/17/2020.  MRI head 12/24/2019 FINDINGS: Brain: Patchy areas of hyperintensity in the left occipital pole extending into the left posterior parietal  lobe best seen on FLAIR. This involves primarily the white matter but some areas of cortex. This area does not show restricted diffusion or susceptibility. This area was normal on the prior MRI 2021. No abnormal enhancement. Moderate atrophy. Mild white matter changes consistent with chronic microvascular ischemia. Diffusion-weighted imaging negative for acute infarct. No hemorrhage or mass lesion. Vascular: Normal arterial flow voids Skull and upper cervical spine: Negative Sinuses/Orbits: Mild mucosal edema paranasal sinuses. Negative orbit Other: None IMPRESSION: Ill-defined  patchy hyperintensity in the left occipital parietal lobe without restricted diffusion or abnormal enhancement. Differential diagnosis includes late subacute infarct, encephalitis. Tumor not considered likely. Correlate with symptoms. Electronically Signed   By: Franchot Gallo M.D.   On: 11/19/2020 15:24   CT Abdomen Pelvis W Contrast  Result Date: 11/17/2020 CLINICAL DATA:  Fever, altered mental status.  Nausea and vomiting. EXAM: CT CHEST, ABDOMEN, AND PELVIS WITH CONTRAST TECHNIQUE: Multidetector CT imaging of the chest, abdomen and pelvis was performed following the standard protocol during bolus administration of intravenous contrast. CONTRAST:  155m OMNIPAQUE IOHEXOL 300 MG/ML  SOLN COMPARISON:  CT abdomen pelvis 12/20/2019, CT angiography chest 12/24/2019 FINDINGS: CT CHEST FINDINGS Cardiovascular: Normal heart size. No significant pericardial effusion. The thoracic aorta is normal in caliber. Mild atherosclerotic plaque of the thoracic aorta. The main pulmonary artery measures at the upper limits of normal: 3.2 cm. Mediastinum/Nodes: No enlarged mediastinal, hilar, or axillary lymph nodes. Thyroid gland, trachea, and esophagus demonstrate no significant findings. Lungs/Pleura: Right lower lobe atelectasis with persistent elevated right hemidiaphragm. Limited evaluation for pulmonary nodule due to respiratory motion artifact. No pulmonary mass. No focal consolidation. No pleural effusion. No pneumothorax. Musculoskeletal: No chest wall abnormality. No suspicious lytic or blastic osseous lesions. No acute displaced fracture. Multilevel degenerative changes of the spine. CT ABDOMEN PELVIS FINDINGS Hepatobiliary: No focal liver abnormality. Gallstone noted within the gallbladder lumen. No gallbladder wall thickening or pericholecystic fluid. No biliary dilatation. Pancreas: No focal lesion. Normal pancreatic contour. No surrounding inflammatory changes. No main pancreatic ductal dilatation. Spleen: Normal in  size without focal abnormality. Adrenals/Urinary Tract: No adrenal nodule bilaterally. Bilateral kidneys enhance symmetrically. Exophytic hypodense 2.2 cm lesion within the right kidney likely represent simple renal cysts. Slightly increased density likely due to streak artifact at this level. No hydronephrosis. No hydroureter. The urinary bladder is unremarkable. On delayed imaging, there is no urothelial wall thickening and there are no filling defects in the opacified portions of the bilateral collecting systems or ureters. Stomach/Bowel: Stomach is within normal limits. No evidence of bowel wall thickening or dilatation. 8 cm stool ball within the rectum. The appendix not definitely identified. Vascular/Lymphatic: No abdominal aorta or iliac aneurysm. Mild atherosclerotic plaque of the aorta and its branches. No abdominal, pelvic, or inguinal lymphadenopathy. Reproductive: Difficult to measure grossly stable uterine fibroids. No adnexal lesion. Other: No intraperitoneal free fluid. No intraperitoneal free gas. No organized fluid collection. Musculoskeletal: Tiny fat containing left inguinal hernia. No suspicious lytic or blastic osseous lesions. No acute displaced fracture. Multilevel degenerative changes of the spine. IMPRESSION: 1. Main pulmonary artery measures at the upper limits of normal. Correlate with pulmonary hypertension. 2. Otherwise no acute intrathoracic abnormality. 3. Cholelithiasis with no CT findings of acute cholecystitis. 4. An 8 cm stool ball within the rectum with no associated findings suggest of stercoral colitis. 5. 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 Electronically Signed   By: MIven FinnM.D.   On: 11/17/2020 20:07   EEG adult  Result Date: 11/21/2020 Plancher, JNeldon Newport  R, MD     11/21/2020  3:50 PM PROCEDURE: EEG routine 22 minutes with video study  DATES OF TEST: 11/21/2020  REASON FOR TEST: acute encephalopathy  AED/Sedative MEDICATIONS: n/a  TECHNIQUE: This is an 18 channel  digital EEG recording using the standard international 10/20 system of electrode placement with one channel EKG recording. Pt was awake during this recording.  FINDINGS: Background rhythm: symmetric diffuse 4-8 Hz, mostly myogenic artifact. Amplitude :  low Continuity: continuous Breach effect:  NO Variability: YES Reactivity: YES Rhythmic delta activity: NO Periodic discharges: NO Sporadic epileptiform discharges: NO Electrographic/electroclinical seizures: NO  Limited EKG reveals no abnormalities.   IMPRESSION: This is an abnormal study due to - 1. Moderate to mild Generalized slowing is a non-specific finding consistent with a generalized disturbance of cerebral functioning including toxic, metabolic, or structural abnormalities that are multi-focal or diffuse. 2. No definite epileptiform discharges or electrographic seizures noted.    ECHOCARDIOGRAM COMPLETE  Result Date: 11/19/2020    ECHOCARDIOGRAM REPORT   Patient Name:   LANIA ZAWISTOWSKI Date of Exam: 11/19/2020 Medical Rec #:  [id number]      Height:       66.0 in Accession #:    [id number]     Weight:       195.2 lb Date of Birth:  1946/02/24       BSA:          1.979 m Patient Age:    51 years       BP:           145/76 mmHg Patient Gender: F              HR:           75 bpm. Exam Location:  Inpatient Procedure: 2D Echo Indications:    bacteremia  History:        Patient has prior history of Echocardiogram examinations, most                 recent 12/23/2019. CHF, Arrythmias:Atrial Fibrillation,                 Signs/Symptoms:Bacteremia; Risk Factors:Diabetes.  Sonographer:    Johny Chess Referring Phys: [id number] BFXOV LATIF [facility]  Sonographer Comments: Image acquisition challenging due to uncooperative patient and Image acquisition challenging due to patient body habitus. IMPRESSIONS  1. Limited study due to poor acoustic windows. Left ventricular ejection fraction, by estimation, is 60 to 65%. The left ventricle has normal function. The left  ventricle has no regional wall motion abnormalities. There is mild concentric left ventricular hypertrophy. Left ventricular diastolic parameters are consistent with Grade I diastolic dysfunction (impaired relaxation).  2. Right ventricular systolic function is mildly reduced. The right ventricular size is mildly-to-moderately enlarged.  3. The mitral valve is normal in structure. Trivial mitral valve regurgitation. No evidence of mitral stenosis.  4. The aortic valve is tricuspid. There is mild calcification of the aortic valve. There is mild thickening of the aortic valve. Aortic valve regurgitation is not visualized. Mild aortic valve sclerosis is present, with no evidence of aortic valve stenosis.  5. No valvular vegetations visualized on surface echo. Pending clinical suspicion for infective endocarditis, can consider TEE for further evaluation. Comparison(s): Compared to prior TTE on 29/19/16, the RV systolic function appears mildly improved. Otherwise there is no significant change. Conclusion(s)/Recommendation(s): No evidence of valvular vegetations on this transthoracic echocardiogram. Would recommend a transesophageal echocardiogram to exclude infective endocarditis if clinically indicated. FINDINGS  Left Ventricle:  Limited study due to poor acoustic windows. Left ventricular ejection fraction, by estimation, is 60 to 65%. The left ventricle has normal function. The left ventricle has no regional wall motion abnormalities. Definity contrast agent was given IV to delineate the left ventricular endocardial borders. The left ventricular internal cavity size was normal in size. There is mild concentric left ventricular hypertrophy. Left ventricular diastolic parameters are consistent with Grade I diastolic dysfunction (impaired relaxation). Right Ventricle: The right ventricular size is mildly-to-moderately enlarged. Right vetricular wall thickness was not well visualized. Right ventricular systolic function  is mildly reduced. Left Atrium: Left atrial size was normal in size. Right Atrium: Right atrial size was normal in size. Pericardium: There is no evidence of pericardial effusion. Mitral Valve: The mitral valve is normal in structure. There is mild thickening of the mitral valve leaflet(s). There is mild calcification of the mitral valve leaflet(s). Trivial mitral valve regurgitation. No evidence of mitral valve stenosis. Tricuspid Valve: The tricuspid valve is normal in structure. Tricuspid valve regurgitation is trivial. Aortic Valve: The aortic valve is tricuspid. There is mild calcification of the aortic valve. There is mild thickening of the aortic valve. Aortic valve regurgitation is not visualized. Mild aortic valve sclerosis is present, with no evidence of aortic valve stenosis. Pulmonic Valve: The pulmonic valve was not well visualized. Pulmonic valve regurgitation is not visualized. Aorta: The aortic root and ascending aorta are structurally normal, with no evidence of dilitation. IAS/Shunts: No atrial level shunt detected by color flow Doppler.  LEFT VENTRICLE PLAX 2D LVIDd:         4.26 cm LVIDs:         2.71 cm LV PW:         1.17 cm LV IVS:        1.06 cm LVOT diam:     1.80 cm LVOT Area:     2.54 cm  RIGHT VENTRICLE RV S prime:     9.90 cm/s LEFT ATRIUM         Index LA diam:    3.80 cm 1.92 cm/m   AORTA Ao Root diam: 2.90 cm Ao Asc diam:  3.40 cm  SHUNTS Systemic Diam: 1.80 cm Gwyndolyn Kaufman MD Electronically signed by Gwyndolyn Kaufman MD Signature Date/Time: 11/19/2020/5:00:20 PM    Final    [place] EKG SITE RITE  Result Date: 11/23/2020 If Site Rite image not attached, placement could not be confirmed due to current cardiac rhythm.  DG FL GUIDED LUMBAR PUNCTURE  Result Date: 11/18/2020 CLINICAL DATA:  Encephalopathy.  Fever.  Rule out meningitis EXAM: DIAGNOSTIC LUMBAR PUNCTURE UNDER FLUOROSCOPIC GUIDANCE COMPARISON:  CT abdomen pelvis 11/17/2020 FLUOROSCOPY TIME:  Fluoroscopy Time:  0  minutes 48 seconds Radiation Exposure Index (if provided by the fluoroscopic device): Number of Acquired Spot Images: 1 PROCEDURE: Patient was sedated prior to the study. The patient was uncooperative. Informed consent was obtained from the patient's brother by telephone prior to the procedure, including potential complications of headache, allergy, and pain. With the patient prone, the lower back was prepped with Betadine. 1% Lidocaine was used for local anesthesia. Lumbar puncture was performed at the L2-3 level using a 20 gauge needle with return of clear CSF with an opening pressure of 13 cm water. Six ml of CSF were obtained for laboratory studies. The patient tolerated the procedure well and there were no apparent complications. IMPRESSION: Successful lumbar puncture.  Clear CSF. Electronically Signed   By: Franchot Gallo M.D.   On: 11/18/2020 14:20

## 2020-11-25 NOTE — Procedures (Signed)
Interventional Radiology Procedure Note  Procedure: Right IJ tunneled CVC, tip in the RA and ready for use.    Complications: None  Estimated Blood Loss: None  Recommendations: - Routine line care.    Signed,  Sterling Big, MD

## 2020-11-26 DIAGNOSIS — F3111 Bipolar disorder, current episode manic without psychotic features, mild: Secondary | ICD-10-CM | POA: Diagnosis not present

## 2020-11-26 LAB — CBC WITH DIFFERENTIAL/PLATELET
Abs Immature Granulocytes: 0.03 10*3/uL (ref 0.00–0.07)
Basophils Absolute: 0 10*3/uL (ref 0.0–0.1)
Basophils Relative: 0 %
Eosinophils Absolute: 0.5 10*3/uL (ref 0.0–0.5)
Eosinophils Relative: 6 %
HCT: 37 % (ref 36.0–46.0)
Hemoglobin: 11.6 g/dL — ABNORMAL LOW (ref 12.0–15.0)
Immature Granulocytes: 0 %
Lymphocytes Relative: 39 %
Lymphs Abs: 3.2 10*3/uL (ref 0.7–4.0)
MCH: 28.8 pg (ref 26.0–34.0)
MCHC: 31.4 g/dL (ref 30.0–36.0)
MCV: 91.8 fL (ref 80.0–100.0)
Monocytes Absolute: 0.7 10*3/uL (ref 0.1–1.0)
Monocytes Relative: 9 %
Neutro Abs: 3.7 10*3/uL (ref 1.7–7.7)
Neutrophils Relative %: 46 %
Platelets: 218 10*3/uL (ref 150–400)
RBC: 4.03 MIL/uL (ref 3.87–5.11)
RDW: 14.7 % (ref 11.5–15.5)
WBC: 8.1 10*3/uL (ref 4.0–10.5)
nRBC: 0 % (ref 0.0–0.2)

## 2020-11-26 LAB — BRAIN NATRIURETIC PEPTIDE: B Natriuretic Peptide: 41.7 pg/mL (ref 0.0–100.0)

## 2020-11-26 LAB — COMPREHENSIVE METABOLIC PANEL
ALT: 14 U/L (ref 0–44)
AST: 13 U/L — ABNORMAL LOW (ref 15–41)
Albumin: 2.3 g/dL — ABNORMAL LOW (ref 3.5–5.0)
Alkaline Phosphatase: 46 U/L (ref 38–126)
Anion gap: 3 — ABNORMAL LOW (ref 5–15)
BUN: 9 mg/dL (ref 8–23)
CO2: 32 mmol/L (ref 22–32)
Calcium: 8.2 mg/dL — ABNORMAL LOW (ref 8.9–10.3)
Chloride: 105 mmol/L (ref 98–111)
Creatinine, Ser: 0.76 mg/dL (ref 0.44–1.00)
GFR, Estimated: >60 mL/min (ref >60)
Glucose, Bld: 150 mg/dL — ABNORMAL HIGH (ref 70–99)
Potassium: 4.3 mmol/L (ref 3.5–5.1)
Sodium: 140 mmol/L (ref 135–145)
Total Bilirubin: 0.6 mg/dL (ref 0.3–1.2)
Total Protein: 4.7 g/dL — ABNORMAL LOW (ref 6.5–8.1)

## 2020-11-26 LAB — GLUCOSE, CAPILLARY
Glucose-Capillary: 116 mg/dL — ABNORMAL HIGH (ref 70–99)
Glucose-Capillary: 152 mg/dL — ABNORMAL HIGH (ref 70–99)
Glucose-Capillary: 188 mg/dL — ABNORMAL HIGH (ref 70–99)
Glucose-Capillary: 209 mg/dL — ABNORMAL HIGH (ref 70–99)

## 2020-11-26 LAB — C-REACTIVE PROTEIN: CRP: 1.5 mg/dL — ABNORMAL HIGH (ref <1.0)

## 2020-11-26 LAB — PROCALCITONIN: Procalcitonin: <0.1 ng/mL

## 2020-11-26 LAB — MAGNESIUM: Magnesium: 2.1 mg/dL (ref 1.7–2.4)

## 2020-11-26 NOTE — Consult Note (Signed)
[facility]   Reason for Consult: ''Severe bipolar disorder with psychosis'' Referring Physician:  Dr Thedore MinsSingh Patient Identification: Becky Hendricks MRN:  [id number] Principal Diagnosis: Bipolar affective disorder, currently manic, mild (HCC) Diagnosis:  Principal Problem:   Bipolar affective disorder, currently manic, mild (HCC) Active Problems:   Acute metabolic encephalopathy   Severe sepsis (HCC)   AF (paroxysmal atrial fibrillation) (HCC)   Sacral wound   Chronic diastolic CHF (congestive heart failure) (HCC)   Bipolar disorder (HCC)   SIRS (systemic inflammatory response syndrome) (HCC)   Nausea and vomiting   Mixed diabetic hyperlipidemia associated with type 2 diabetes mellitus (HCC)   Type 2 diabetes mellitus with hyperglycemia, with long-term current use of insulin (HCC)   Constipation   Encephalopathy   MRSA bacteremia  Total Time spent with patient: 30 minutes   11/26/2020 Subjective: '' I am doing better today.'' Objective: Patient seen face to face in her room today. She is a 75 y/o female with history of Bipolar disorder, multiple medical issues who was admitted aggressive behavior and altered mental status. Today, patient is alert, awake,  Cooperative, oriented to time, place and person. She reports favorable response to her current psychiatric medications and has been cooperative with taking medications. She has been showing decreased agitation, aggression and says she will be compliant with taking her medications from now on. She denies psychosis, delusions and self harming thoughts. She is happy to have a ''pleasant'' sitter by her bedside.Marland Kitchen.   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  Patient is unable to provide a history at this time due to being minimally responsive.   Past Psychiatric History: bipolar  Risk to Self:  none Risk to Others:  none Prior Inpatient Therapy:  unknown Prior Outpatient Therapy:  unknown  Past Medical History:  Past Medical History:  Diagnosis Date  . Atrial fibrillation (HCC)   . Bipolar disorder (HCC)   . Bipolar disorder (HCC) 04/27/2020  . Clostridium difficile diarrhea 08/02/2019  . Dehydration   . Essential hypertension   . Fall 02/11/2020  . Morbid obesity (HCC)   . Osteomyelitis (HCC) 12/03/2019  . Pneumonia due to COVID-19 virus 09/15/2019  . Type 2 diabetes mellitus (HCC)   . Weakness     Past Surgical History:  Procedure Laterality Date  . INCISION AND DRAINAGE PERIRECTAL ABSCESS Left 12/04/2019   Procedure: IRRIGATION AND DEBRIDEMENT LEFT BUTTOCK ABSCESS;  Surgeon: Violeta Gelinashompson, Burke, MD;  [facility];  Service: General;  Laterality: Left;  . INCISION AND DRAINAGE PERIRECTAL ABSCESS Left 12/10/2019   Procedure: REPEAT IRRIGATION AND DEBRIDEMENT BUTTOCK  ABSCESS;  Surgeon: Abigail MiyamotoBlackman, Douglas, MD;  Location: [facility] OR;  Service: General;  Laterality: Left;  . IR FLUORO GUIDE CV LINE RIGHT  11/25/2020  . IR US GUIDE VASC ACCESS RIGHT  11/25/2020   Family History:  Family History  Family history unknown: Yes   Family Psychiatric  History: unknown Social History:  Social History   Substance and Sexual Activity  Alcohol Use Not Currently     Social History   Substance and Sexual Activity  Drug Use Not Currently    Social History  Socioeconomic History  . Marital status: Single    Spouse name: Not on file  . Number of children: Not on file  . Years of education: Not on file  . Highest education level: Not on file  Occupational History  . Not  on file  Tobacco Use  . Smoking status: Never Smoker  . Smokeless tobacco: Never Used  Vaping Use  . Vaping Use: Unknown  Substance and Sexual Activity  . Alcohol use: Not Currently  . Drug use: Not Currently  . Sexual activity: Not Currently    Birth control/protection: Post-menopausal  Other Topics Concern  . Not on file  Social History Narrative  . Not on file   Social Determinants of Health   Financial Resource Strain: Not on file  Food Insecurity: Not on file  Transportation Needs: Not on file  Physical Activity: Not on file  Stress: Not on file  Social Connections: Not on file   Additional Social History:    Allergies:   Allergies  Allergen Reactions  . Chlorhexidine Gluconate Itching  . Acetazolamide Er Rash    Labs:  Results for orders placed or performed during the hospital encounter of 11/17/20 (from the past 48 hour(s))  Glucose, capillary     Status: Abnormal   Collection Time: 11/24/20 12:09 PM  Result Value Ref Range   Glucose-Capillary 138 (H) 70 - 99 mg/dL    Comment: Glucose reference range applies only to samples taken after fasting for at least 8 hours.  Glucose, capillary     Status: Abnormal   Collection Time: 11/24/20  5:09 PM  Result Value Ref Range   Glucose-Capillary 120 (H) 70 - 99 mg/dL    Comment: Glucose reference range applies only to samples taken after fasting for at least 8 hours.  Glucose, capillary     Status: Abnormal   Collection Time: 11/24/20 11:41 PM  Result Value Ref Range   Glucose-Capillary 143 (H) 70 - 99 mg/dL    Comment: Glucose reference range applies only to samples taken after fasting for at least 8 hours.   Comment 1 Notify RN    Comment 2 Document in Chart   Procalcitonin     Status: None   Collection Time: 11/25/20  2:18 AM  Result Value Ref Range   Procalcitonin <0.10 ng/mL    Comment:        Interpretation: PCT (Procalcitonin) <= 0.5 ng/mL: Systemic infection (sepsis) is not likely. Local bacterial  infection is possible. (NOTE)       Sepsis PCT Algorithm           Lower Respiratory Tract                                      Infection PCT Algorithm    ----------------------------     ----------------------------         PCT < 0.25 ng/mL                PCT < 0.10 ng/mL          Strongly encourage             Strongly discourage   discontinuation of antibiotics    initiation of antibiotics    ----------------------------     -----------------------------       PCT 0.25 - 0.50 ng/mL            PCT 0.10 - 0.25  ng/mL               OR       >80% decrease in PCT            Discourage initiation of                                            antibiotics      Encourage discontinuation           of antibiotics    ----------------------------     -----------------------------         PCT >= 0.50 ng/mL              PCT 0.26 - 0.50 ng/mL               AND        <80% decrease in PCT             Encourage initiation of                                             antibiotics       Encourage continuation           of antibiotics    ----------------------------     -----------------------------        PCT >= 0.50 ng/mL                  PCT > 0.50 ng/mL               AND         increase in PCT                  Strongly encourage                                      initiation of antibiotics    Strongly encourage escalation           of antibiotics                                     -----------------------------                                           PCT <= 0.25 ng/mL                                                 OR                                        > 80% decrease in PCT  Discontinue / Do not initiate                                             antibiotics  Performed at [facility] Lab, [address], [place], [place] [address]   Magnesium     Status: None   Collection Time: 11/25/20  2:18 AM  Result Value Ref Range   Magnesium 2.0 1.7  - 2.4 mg/dL    Comment: Performed at [facility], [address] [place], [place] [address]  Comprehensive metabolic panel     Status: Abnormal   Collection Time: 11/25/20  2:18 AM  Result Value Ref Range   Sodium 142 135 - 145 mmol/L   Potassium 4.3 3.5 - 5.1 mmol/L   Chloride 105 98 - 111 mmol/L   CO2 32 22 - 32 mmol/L   Glucose, Bld 113 (H) 70 - 99 mg/dL    Comment: Glucose reference range applies only to samples taken after fasting for at least 8 hours.   BUN 11 8 - 23 mg/dL   Creatinine, Ser 0.98 0.44 - 1.00 mg/dL   Calcium 8.6 (L) 8.9 - 10.3 mg/dL   Total Protein 4.9 (L) 6.5 - 8.1 g/dL   Albumin 2.3 (L) 3.5 - 5.0 g/dL   AST 10 (L) 15 - 41 U/L   ALT 13 0 - 44 U/L   Alkaline Phosphatase 38 38 - 126 U/L   Total Bilirubin 0.4 0.3 - 1.2 mg/dL   GFR, Estimated >11 >91 mL/min    Comment: (NOTE) Calculated using the CKD-EPI Creatinine Equation (2021)    Anion gap 5 5 - 15    Comment: Performed at [facility], [address] [place], [place] [address]  CBC with Differential/Platelet     Status: Abnormal   Collection Time: 11/25/20  2:18 AM  Result Value Ref Range   WBC 9.1 4.0 - 10.5 K/uL   RBC 3.90 3.87 - 5.11 MIL/uL   Hemoglobin 11.1 (L) 12.0 - 15.0 g/dL   HCT 56.2 (L) 13.0 - 86.5 %   MCV 90.8 80.0 - 100.0 fL   MCH 28.5 26.0 - 34.0 pg   MCHC 31.4 30.0 - 36.0 g/dL   RDW 78.4 69.6 - 29.5 %   Platelets 226 150 - 400 K/uL   nRBC 0.0 0.0 - 0.2 %   Neutrophils Relative % 41 %   Neutro Abs 3.8 1.7 - 7.7 K/uL   Lymphocytes Relative 42 %   Lymphs Abs 3.8 0.7 - 4.0 K/uL   Monocytes Relative 11 %   Monocytes Absolute 1.0 0.1 - 1.0 K/uL   Eosinophils Relative 5 %   Eosinophils Absolute 0.5 0.0 - 0.5 K/uL   Basophils Relative 0 %   Basophils Absolute 0.0 0.0 - 0.1 K/uL   Immature Granulocytes 1 %   Abs Immature Granulocytes 0.06 0.00 - 0.07 K/uL    Comment: Performed at [facility], [address] [place], [place] [address]  C-reactive protein     Status:  Abnormal   Collection Time: 11/25/20  2:18 AM  Result Value Ref Range   CRP 1.0 (H) <1.0 mg/dL    Comment: Performed at [facility], [address] [place], [place] [address]  Brain natriuretic peptide     Status: None   Collection Time: 11/25/20  2:18 AM  Result Value Ref Range   B Natriuretic Peptide 28.2 0.0 - 100.0 pg/mL    Comment: Performed at [facility] Lab, [address] [place], [place] [address]  Glucose, capillary     Status: Abnormal   Collection Time: 11/25/20  6:01 AM  Result Value Ref Range   Glucose-Capillary 123 (H) 70 - 99 mg/dL    Comment: Glucose reference range applies only to samples taken after fasting for at least 8 hours.  Glucose, capillary     Status: Abnormal   Collection Time: 11/25/20  1:06 PM  Result Value Ref Range   Glucose-Capillary 104 (H) 70 - 99 mg/dL    Comment: Glucose reference range applies only to samples taken after fasting for at least 8 hours.  Glucose, capillary     Status: None   Collection Time: 11/25/20  4:53 PM  Result Value Ref Range   Glucose-Capillary 89 70 - 99 mg/dL    Comment: Glucose reference range applies only to samples taken after fasting for at least 8 hours.  Glucose, capillary     Status: Abnormal   Collection Time: 11/26/20 12:10 AM  Result Value Ref Range   Glucose-Capillary 152 (H) 70 - 99 mg/dL    Comment: Glucose reference range applies only to samples taken after fasting for at least 8 hours.  Procalcitonin     Status: None   Collection Time: 11/26/20  2:16 AM  Result Value Ref Range   Procalcitonin <0.10 ng/mL    Comment:        Interpretation: PCT (Procalcitonin) <= 0.5 ng/mL: Systemic infection (sepsis) is not likely. Local bacterial infection is possible. (NOTE)       Sepsis PCT Algorithm           Lower Respiratory Tract                                      Infection PCT Algorithm    ----------------------------     ----------------------------         PCT < 0.25 ng/mL                 PCT < 0.10 ng/mL          Strongly encourage             Strongly discourage   discontinuation of antibiotics    initiation of antibiotics    ----------------------------     -----------------------------       PCT 0.25 - 0.50 ng/mL            PCT 0.10 - 0.25 ng/mL               OR       >80% decrease in PCT            Discourage initiation of                                            antibiotics      Encourage discontinuation           of antibiotics    ----------------------------     -----------------------------         PCT >= 0.50 ng/mL              PCT 0.26 - 0.50 ng/mL  AND        <80% decrease in PCT             Encourage initiation of                                             antibiotics       Encourage continuation           of antibiotics    ----------------------------     -----------------------------        PCT >= 0.50 ng/mL                  PCT > 0.50 ng/mL               AND         increase in PCT                  Strongly encourage                                      initiation of antibiotics    Strongly encourage escalation           of antibiotics                                     -----------------------------                                           PCT <= 0.25 ng/mL                                                 OR                                        > 80% decrease in PCT                                      Discontinue / Do not initiate                                             antibiotics  Performed at [facility] Lab, [address] [place], [place] [address]   Magnesium     Status: None   Collection Time: 11/26/20  2:16 AM  Result Value Ref Range   Magnesium 2.1 1.7 - 2.4 mg/dL    Comment: Performed at [facility], [address], [place], [place] [address]  Comprehensive metabolic panel     Status: Abnormal   Collection Time: 11/26/20  2:16 AM  Result Value Ref Range   Sodium 140 135 - 145 mmol/L   Potassium  4.3 3.5 - 5.1 mmol/L  Chloride 105 98 - 111 mmol/L   CO2 32 22 - 32 mmol/L   Glucose, Bld 150 (H) 70 - 99 mg/dL    Comment: Glucose reference range applies only to samples taken after fasting for at least 8 hours.   BUN 9 8 - 23 mg/dL   Creatinine, Ser 1.61 0.44 - 1.00 mg/dL   Calcium 8.2 (L) 8.9 - 10.3 mg/dL   Total Protein 4.7 (L) 6.5 - 8.1 g/dL   Albumin 2.3 (L) 3.5 - 5.0 g/dL   AST 13 (L) 15 - 41 U/L   ALT 14 0 - 44 U/L   Alkaline Phosphatase 46 38 - 126 U/L   Total Bilirubin 0.6 0.3 - 1.2 mg/dL   GFR, Estimated >09 >60 mL/min    Comment: (NOTE) Calculated using the CKD-EPI Creatinine Equation (2021)    Anion gap 3 (L) 5 - 15    Comment: Performed at [facility], [address] [place], [place] [address]  CBC with Differential/Platelet     Status: Abnormal   Collection Time: 11/26/20  2:16 AM  Result Value Ref Range   WBC 8.1 4.0 - 10.5 K/uL   RBC 4.03 3.87 - 5.11 MIL/uL   Hemoglobin 11.6 (L) 12.0 - 15.0 g/dL   HCT 81.1 91.4 - 78.2 %   MCV 91.8 80.0 - 100.0 fL   MCH 28.8 26.0 - 34.0 pg   MCHC 31.4 30.0 - 36.0 g/dL   RDW 95.6 21.3 - 08.6 %   Platelets 218 150 - 400 K/uL   nRBC 0.0 0.0 - 0.2 %   Neutrophils Relative % 46 %   Neutro Abs 3.7 1.7 - 7.7 K/uL   Lymphocytes Relative 39 %   Lymphs Abs 3.2 0.7 - 4.0 K/uL   Monocytes Relative 9 %   Monocytes Absolute 0.7 0.1 - 1.0 K/uL   Eosinophils Relative 6 %   Eosinophils Absolute 0.5 0.0 - 0.5 K/uL   Basophils Relative 0 %   Basophils Absolute 0.0 0.0 - 0.1 K/uL   Immature Granulocytes 0 %   Abs Immature Granulocytes 0.03 0.00 - 0.07 K/uL    Comment: Performed at [facility] Lab, [address], [place], [place] [address]  C-reactive protein     Status: Abnormal   Collection Time: 11/26/20  2:16 AM  Result Value Ref Range   CRP 1.5 (H) <1.0 mg/dL    Comment: Performed at [facility], [address], [place], [place] [address]  Brain natriuretic peptide     Status: None   Collection Time: 11/26/20   2:16 AM  Result Value Ref Range   B Natriuretic Peptide 41.7 0.0 - 100.0 pg/mL    Comment: Performed at [facility], [address] [place], [place] [address]  Glucose, capillary     Status: Abnormal   Collection Time: 11/26/20  6:18 AM  Result Value Ref Range   Glucose-Capillary 116 (H) 70 - 99 mg/dL    Comment: Glucose reference range applies only to samples taken after fasting for at least 8 hours.   Comment 1 Notify RN    Comment 2 Document in Chart   Glucose, capillary     Status: Abnormal   Collection Time: 11/26/20 11:15 AM  Result Value Ref Range   Glucose-Capillary 209 (H) 70 - 99 mg/dL    Comment: Glucose reference range applies only to samples taken after fasting for at least 8 hours.    Current Facility-Administered Medications  Medication Dose Route Frequency Provider Last Rate Last Admin  . acetaminophen (TYLENOL) tablet 650 mg  650 mg Oral Q6H PRN Shalhoub, Deno Lunger, MD       Or  . acetaminophen (TYLENOL) suppository 650 mg  650 mg Rectal Q6H PRN Shalhoub, Deno Lunger, MD      . amLODipine (NORVASC) tablet 5 mg  5 mg Oral Daily Leroy Sea, MD   5 mg at 11/26/20 0918  . apixaban (ELIQUIS) tablet 5 mg  5 mg Oral BID Marguerita Merles Woodall, DO   5 mg at 11/26/20 6578  . atorvastatin (LIPITOR) tablet 10 mg  10 mg Oral Daily Leroy Sea, MD   10 mg at 11/26/20 0919  . carvedilol (COREG) tablet 6.25 mg  6.25 mg Oral BID WC Leroy Sea, MD   6.25 mg at 11/26/20 0918  . DAPTOmycin (CUBICIN) 700 mg in sodium chloride 0.9 % IVPB  8 mg/kg Intravenous Q2000 Leroy Sea, MD 128 mL/hr at 11/25/20 2143 700 mg at 11/25/20 2143  . divalproex (DEPAKOTE) DR tablet 500 mg  500 mg Oral Q12H Leroy Sea, MD   500 mg at 11/26/20 0918  . docusate sodium (COLACE) capsule 200 mg  200 mg Oral BID Susa Raring K, MD   200 mg at 11/25/20 2145  . hydrALAZINE (APRESOLINE) injection 10 mg  10 mg Intravenous Q6H PRN Leroy Sea, MD   10 mg at 11/21/20 1220  .  insulin aspart (novoLOG) injection 0-15 Units  0-15 Units Subcutaneous Q6H Shalhoub, Deno Lunger, MD   3 Units at 11/26/20 0019  . lidocaine (PF) (XYLOCAINE) 1 % injection    PRN Sterling Big, MD   5 mL at 11/25/20 1619  . LORazepam (ATIVAN) injection 1 mg  1 mg Intravenous Q6H PRN Maryagnes Amos, FNP   1 mg at 11/25/20 1405  . multivitamin with minerals tablet 1 tablet  1 tablet Oral Daily Leroy Sea, MD   1 tablet at 11/26/20 0918  . ondansetron (ZOFRAN) injection 4 mg  4 mg Intravenous Q6H PRN Shalhoub, Deno Lunger, MD      . pantoprazole (PROTONIX) EC tablet 40 mg  40 mg Oral Daily Leroy Sea, MD   40 mg at 11/26/20 0918  . polyethylene glycol (MIRALAX / GLYCOLAX) packet 17 g  17 g Oral BID Leroy Sea, MD   17 g at 11/26/20 0917  . risperiDONE (RISPERDAL M-TABS) disintegrating tablet 2 mg  2 mg Oral BID Maryagnes Amos, FNP   2 mg at 11/26/20 0917  . tamsulosin (FLOMAX) capsule 0.4 mg  0.4 mg Oral Daily Leroy Sea, MD   0.4 mg at 11/26/20 4696    Musculoskeletal: Strength & Muscle Tone: within normal limits Gait & Station: did not witness Patient leans: N/A  Psychiatric Specialty Exam: Physical Exam Vitals and nursing note reviewed.  Constitutional:      Appearance: Normal appearance.  HENT:     Head: Normocephalic.     Nose: Nose normal.  Pulmonary:     Effort: Pulmonary effort is normal.  Neurological:     General: No focal deficit present.     Mental Status: She is alert.  Psychiatric:        Attention and Perception: Perception normal.        Mood and Affect: Mood and affect normal.        Speech: Speech normal.        Behavior: Behavior is  cooperative.        Thought Content: Thought content normal.        Cognition and Memory: Memory normal.        Judgment: Judgment is impulsive.     Review of Systems  Psychiatric/Behavioral: Positive for behavioral problems.  All other systems reviewed and are negative.   Blood  pressure 136/67, pulse 73, temperature 97.8 F (36.6 C), temperature source Oral, resp. rate 20, weight 88 kg, SpO2 98 %.Body mass index is 31.31 kg/m.  General Appearance: Casual  Eye Contact:  Minimal  Speech:  Clear and Coherent and Pressured  Volume:  Normal  Mood:  Angry, Anxious and Irritable  Affect:  Blunt, Labile and Full Range  Thought Process:  Coherent, Linear and Descriptions of Associations: Circumstantial  Orientation:  Full (Time, Place, and Person)  Thought Content:  Logical, Delusions, Ideas of Reference:   Delusions, Rumination and Tangential  Suicidal Thoughts:  No  Homicidal Thoughts:  No  Memory:  Immediate;   Fair Recent;   Fair  Judgement:  Poor  Insight:  Shallow  Psychomotor Activity:  Normal  Concentration:  Concentration: Fair and Attention Span: Fair  Recall:  [employer] of Knowledge:  Fair  Language:  Fair  Akathisia:  No  Handed:  Right  AIMS (if indicated):     Assets:  Housing Leisure Time Resilience Social Support  ADL's:  Impaired  Cognition:  Impaired,  Moderate  Sleep:      Physical Exam: Physical Exam Vitals and nursing note reviewed.  Constitutional:      Appearance: Normal appearance.  HENT:     Head: Normocephalic.     Nose: Nose normal.  Pulmonary:     Effort: Pulmonary effort is normal.  Neurological:     General: No focal deficit present.     Mental Status: She is alert.  Psychiatric:        Attention and Perception: Perception normal.        Mood and Affect: Mood and affect normal.        Speech: Speech normal.        Behavior: Behavior is cooperative.        Thought Content: Thought content normal.        Cognition and Memory: Memory normal.        Judgment: Judgment is impulsive.    Review of Systems  Psychiatric/Behavioral: Positive for behavioral problems.  All other systems reviewed and are negative.  Blood pressure 136/67, pulse 73, temperature 97.8 F (36.6 C), temperature source Oral, resp. rate 20,  weight 88 kg, SpO2 98 %. Body mass index is 31.31 kg/m.  Treatment Plan Summary:  75 year old female with history of Bipolar disorder who stopped taking her medications few months prior to this hospital admission. She was admitted with AMS and aggressive behavior. Today, patient seems cooperative, denies psychosis, delusions, self harming thoughts and showing favorable response to current psychiatric medications.  Recommendations: -Continue 1:1 sitter for safety -Continue Depakote 500 mg BID for aggression/agitation -Continue Risperdal 2 mg twice daily for psychosis - Depakote level in 4 days (11/30/20)   -Patient's QTC has increased to 510 on today's date.   -Will continue serial daily EKGs as she remains at risk for prolonged QTc. (457>510).   -We will add Benadryl 50 mg p.o./25 mg IV twice daily prn for sedation, anxiety, and agitation.   Disposition: Supportive therapy provided about ongoing stressors.  Thedore Mins, MD 11/26/2020 11:17 AM

## 2020-11-26 NOTE — Progress Notes (Signed)
PROGRESS NOTE                                                                                                                                                                                                             Patient Demographics:    Becky Hendricks, is a 75 y.o. female, DOB - Oct 09, 1945, IFB:[id number]  Outpatient Primary MD for the patient is [name], MD    LOS - 8  Admit date - 11/17/2020    Chief Complaint  Patient presents with  . ivc  . 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  So far LP, CT scan chest abdomen pelvis nonacute, MRI with questionable occipital lobe changes.   Subjective:   Patient in bed, appears comfortable, denies any headache, no fever, no chest pain or pressure, no shortness of breath , no abdominal pain. No new focal weakness.   Assessment  & Plan :   1.  Acute toxic encephalopathy gradually progressive at SNF - MRI with ?  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  2. Sepsis in the setting of MRSA bacteremia - ID consulted, on Vancomycin >> Dapto, TTE stable, per ID no need for TEE, repeat  cultures stable thus far.  Per ID total of 4 weeks of IV antibiotics, tunneled PICC line requested by IR & placed on 11/25/2020 as she has pulled multiple IVs.  3.  Bipolar disorder.  Currently on combination of Depakote and Haldol, per Psych Seroquel held.  Psych has seen the patient, request continued follow-up.  According to the brother she had stopped taking her psych medications 2 months ago, her main issue this admission seems to be largely psych disorder per neurology, will defer management to psych request Psych to follow daily.  4. H/O Sacral Osteomyelitis with healed Scaral Decub Ulcer - continue nursing wound care, stable ESR CRP and procalcitonin as well.   5. N&V with stool ball - supportive Rx, bowel regimen.   6.  Paroxysmal A. fib.  Mali vas 2 score of greater than 3.  Currently on Eliquis, not on any rate controlling agents.  7.  Dyslipidemia.  On statin.  8.  Chronic diastolic CHF EF 17%.  Currently compensated.  9.  Obesity.  BMI of 31.  Supportive care follow-up with PCP for weight loss.  10.  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 Vanco stopped 11/20/20. Rash ++ better.  11. ? MRI changes DW Neuro Dr Rory Percy, likely PRESS - BP now in good control.  12.  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  13.  Prolonged QTC.  Likely due to her psych medications, electrolytes stable, placed on Coreg, QTc is much improved and within normal limits on 11/26/20 after Coreg was added, has refused and pulled Tele x 3,  will monitor on EKG.  14.  Urinary retention.  Multiple  and I &O's done, now Foley and Flomax from 11/24/20.  15. DM type II.  For now sliding scale.  Lab Results  Component Value Date   HGBA1C 6.1 (H) 11/18/2020   CBG (last 3)  Recent Labs    11/25/20 1653 11/26/20 0010 11/26/20 0618  GLUCAP 24 152* 116*        Condition - Fair  Family Communication  : Becky Hendricks [phone] over the phone on 11/20/2020 at 9:36 AM, 11/21/20, discussed clearly patient's refusal to take medications, has been refusing taking her psych medications for several months.  If significant decline he will consider comfort measures.  For now we are trying her best to optimize medication intake and improve her mentation, 11/24/20  Code Status :  Full  Consults  :  Neuro, Psych, ID, IR for PICC line  PUD Prophylaxis : PPI   Procedures  :     Right IJ based by IR on 11/25/2020.    Foley catheter placed 11/25/2020   MRI - Ill-defined patchy hyperintensity in the left occipital parietal lobe without restricted diffusion or abnormal enhancement. Differential diagnosis includes late subacute infarct, encephalitis.   LP - non acute  CT - 1. Main pulmonary artery measures at the upper limits of normal. Correlate with pulmonary hypertension. 2. Otherwise no acute intrathoracic abnormality. 3. Cholelithiasis with no CT findings of acute cholecystitis. 4. An 8 cm stool ball within the rectum with no associated findings suggest of stercoral colitis. 5. Grossly stable in size uterine fibroids  TTE - 1. Limited study due to poor acoustic windows. Left ventricular ejection fraction, by estimation, is 60 to 65%. The left ventricle has normal function. The left ventricle has no regional wall motion abnormalities. There is mild concentric left ventricular hypertrophy. Left ventricular diastolic parameters are consistent with Grade I diastolic dysfunction (  impaired relaxation).  2. Right ventricular systolic function is mildly reduced. The right ventricular size is  mildly-to-moderately enlarged.  3. The mitral valve is normal in structure. Trivial mitral valve regurgitation. No evidence of mitral stenosis.  4. The aortic valve is tricuspid. There is mild calcification of the aortic valve. There is mild thickening of the aortic valve. Aortic valve regurgitation is not visualized. Mild aortic valve sclerosis is present, with no evidence of aortic valve stenosis.  5. No valvular vegetations visualized on surface echo      Disposition Plan  :    Status is: Inpatient  Remains inpatient appropriate because:IV treatments appropriate due to intensity of illness or inability to take PO   Dispo: The patient is from: SNF              Anticipated d/c is to: SNF              Patient currently is not medically stable to d/c.   Difficult to place patient No   DVT Prophylaxis  :  Eliquis  Lab Results  Component Value Date   PLT 218 11/26/2020    Diet :  Diet Order            Diet regular Room service appropriate? Yes; Fluid consistency: Thin  Diet effective now                  Inpatient Medications  Scheduled Meds: . amLODipine  5 mg Oral Daily  . apixaban  5 mg Oral BID  . atorvastatin  10 mg Oral Daily  . carvedilol  6.25 mg Oral BID WC  . divalproex  500 mg Oral Q12H  . docusate sodium  200 mg Oral BID  . insulin aspart  0-15 Units Subcutaneous Q6H  . multivitamin with minerals  1 tablet Oral Daily  . pantoprazole  40 mg Oral Daily  . polyethylene glycol  17 g Oral BID  . risperiDONE  2 mg Oral BID  . tamsulosin  0.4 mg Oral Daily   Continuous Infusions: . DAPTOmycin (CUBICIN)  IV 700 mg (11/25/20 2143)   PRN Meds:.acetaminophen **OR** acetaminophen, hydrALAZINE, lidocaine (PF), LORazepam, [DISCONTINUED] ondansetron **OR** ondansetron (ZOFRAN) IV  Antibiotics  :    Anti-infectives (From admission, onward)   Start     Dose/Rate Route Frequency Ordered Stop   11/20/20 2000  DAPTOmycin (CUBICIN) 700 mg in sodium chloride 0.9 % IVPB         8 mg/kg  88 kg 128 mL/hr over 30 Minutes Intravenous Daily 11/20/20 1657 12/16/20 2359   11/18/20 2200  vancomycin (VANCOREADY) IVPB 1250 mg/250 mL  Status:  Discontinued        1,250 mg 166.7 mL/hr over 90 Minutes Intravenous Every 24 hours 11/17/20 2345 11/20/20 1644   11/18/20 0000  ampicillin (OMNIPEN) 2 g in sodium chloride 0.9 % 100 mL IVPB  Status:  Discontinued        2 g 300 mL/hr over 20 Minutes Intravenous Every 4 hours 11/17/20 2319 11/18/20 1520   11/17/20 2345  vancomycin (VANCOCIN) IVPB 1000 mg/200 mL premix        1,000 mg 200 mL/hr over 60 Minutes Intravenous  Once 11/17/20 2331 11/18/20 0155   11/17/20 2330  cefTRIAXone (ROCEPHIN) 2 g in sodium chloride 0.9 % 100 mL IVPB  Status:  Discontinued        2 g 200 mL/hr over 30 Minutes Intravenous Every 12 hours 11/17/20 2319 11/18/20 1520  Time Spent in minutes  30   Lala Lund M.D on 11/26/2020 at 10:44 AM  To page go to [email or web address]   [facility] -  Office  [phone]    See all Orders from today for further details    Objective:   Vitals:   11/25/20 1651 11/25/20 2034 11/25/20 2348 11/26/20 0425  BP: (!) 139/91 (!) 118/59 (!) 100/55 136/67  Pulse: 69 64 (!) 55 73  Resp: '19 18 20 20  ' Temp: 98 F (36.7 C) 98.1 F (36.7 C) 98 F (36.7 C) 97.8 F (36.6 C)  TempSrc:    Oral  SpO2: 100% 97% 97% 98%  Weight:        Wt Readings from Last 3 Encounters:  11/20/20 88 kg  10/12/20 88.5 kg  05/06/20 92.5 kg     Intake/Output Summary (Last 24 hours) at 11/26/2020 1044 Last data filed at 11/26/2020 0906 Gross per 24 hour  Intake 150 ml  Output 2700 ml  Net -2550 ml     Physical Exam  Awake but confused and less agitated, no focal deficits, diffuse macular rash on arms and trunk much improved, Foley catheter in place, right IJ PICC line in place [place].AT,PERRAL Supple Neck,No JVD, No cervical lymphadenopathy appriciated.  Symmetrical Chest wall movement, Good air movement  bilaterally, CTAB RRR,No Gallops, Rubs or new Murmurs, No Parasternal Heave +ve B.Sounds, Abd Soft, No tenderness, No organomegaly appriciated, No rebound - guarding or rigidity. No Cyanosis,      Data Review:    CBC Recent Labs  Lab 11/22/20 0451 11/23/20 0528 11/24/20 0238 11/25/20 0218 11/26/20 0216  WBC 8.2 9.7 7.9 9.1 8.1  HGB 12.4 12.0 11.0* 11.1* 11.6*  HCT 38.4 37.2 35.2* 35.4* 37.0  PLT 238 237 226 226 218  MCV 89.9 91.0 92.4 90.8 91.8  MCH 29.0 29.3 28.9 28.5 28.8  MCHC 32.3 32.3 31.3 31.4 31.4  RDW 14.8 15.1 15.2 15.1 14.7  LYMPHSABS 2.9 3.3 3.1 3.8 3.2  MONOABS 0.9 1.2* 1.0 1.0 0.7  EOSABS 0.6* 0.5 0.5 0.5 0.5  BASOSABS 0.0 0.0 0.0 0.0 0.0    Recent Labs  Lab 11/22/20 0451 11/23/20 0528 11/24/20 0238 11/25/20 0218 11/26/20 0216  NA 142 143 142 142 140  K 3.3* 4.1 4.1 4.3 4.3  CL 106 108 104 105 105  CO2 26 29 32 32 32  GLUCOSE 135* 109* 103* 113* 150*  BUN '16 9 11 11 9  ' CREATININE 0.67 0.58 0.72 0.74 0.76  CALCIUM 9.3 8.9 8.7* 8.6* 8.2*  AST 11* 12* 11* 10* 13*  ALT '15 16 16 13 14  ' ALKPHOS 41 38 41 38 46  BILITOT 0.7 0.8 0.8 0.4 0.6  ALBUMIN 2.8* 2.6* 2.4* 2.3* 2.3*  MG 1.9 1.9 2.0 2.0 2.1  CRP 1.5* 1.6* 1.6* 1.0* 1.5*  PROCALCITON <0.10 <0.10 <0.10 <0.10 <0.10  BNP 28.1 15.6 21.4 28.2 41.7    ------------------------------------------------------------------------------------------------------------------ No results for input(s): CHOL, HDL, LDLCALC, TRIG, CHOLHDL, LDLDIRECT in the last 72 hours.  Lab Results  Component Value Date   HGBA1C 6.1 (H) 11/18/2020   ------------------------------------------------------------------------------------------------------------------ No results for input(s): TSH, T4TOTAL, T3FREE, THYROIDAB in the last 72 hours.  Invalid input(s): FREET3  Cardiac Enzymes No results for input(s): CKMB, TROPONINI, MYOGLOBIN in the last 168 hours.  Invalid input(s):  CK ------------------------------------------------------------------------------------------------------------------    Component Value Date/Time   BNP 41.7 11/26/2020 0216     Radiology Reports CT ANGIO HEAD W OR WO CONTRAST  Result Date: 11/21/2020 CLINICAL  DATA:  Initial evaluation for neuro deficit, stroke suspected. EXAM: CT ANGIOGRAPHY HEAD AND NECK TECHNIQUE: Multidetector CT imaging of the head and neck was performed using the standard protocol during bolus administration of intravenous contrast. Multiplanar CT image reconstructions and MIPs were obtained to evaluate the vascular anatomy. Carotid stenosis measurements (when applicable) are obtained utilizing NASCET criteria, using the distal internal carotid diameter as the denominator. CONTRAST:  73m OMNIPAQUE IOHEXOL 350 MG/ML SOLN COMPARISON:  Prior MRI from 11/19/2020. FINDINGS: CT HEAD FINDINGS Brain: Age-related cerebral atrophy with mild chronic small vessel ischemic disease. Recently identified signal changes involving the left parieto-occipital region not visible by CT. No acute intracranial hemorrhage. No acute large vessel territory infarct. No mass lesion or midline shift. No hydrocephalus or extra-axial fluid collection. Vascular: No hyperdense vessel. Scattered vascular calcifications noted within the carotid siphons. Skull: Scalp soft tissues and calvarium within normal limits. Sinuses: Scattered mucosal thickening noted within the ethmoidal air cells. Small air-fluid level noted within the right sphenoid sinus. Paranasal sinuses are otherwise clear. No mastoid effusion. Orbits: Globes and orbital soft tissues within normal limits. Review of the MIP images confirms the above findings CTA NECK FINDINGS Aortic arch: Visualized aortic arch within normal limits for caliber with normal 3 vessel morphology. No hemodynamically significant stenosis about the origin the great vessels. Right carotid system: Right common and internal carotid  arteries widely patent without stenosis, dissection or occlusion. Left carotid system: Left common and internal carotid arteries widely patent without stenosis, dissection or occlusion. Vertebral arteries: Both vertebral arteries arise from subclavian arteries. No proximal subclavian artery stenosis. Moderate narrowing of the left V2 segment at the level of C3 related to an extrinsic compression from adjacent uncovertebral disease. Vertebral arteries otherwise widely patent without stenosis, dissection or occlusion. Skeleton: No acute osseous finding. No discrete or worrisome osseous lesions. Hyperostosis frontalis interna noted. Other neck: No other acute soft tissue abnormality within the neck. No mass or adenopathy. Upper chest: Which lies upper chest demonstrates no acute finding. Review of the MIP images confirms the above findings CTA HEAD FINDINGS Anterior circulation: Both internal carotid arteries widely patent to the termini without stenosis. A1 segments widely patent. Normal anterior communicating artery complex. Both anterior cerebral arteries widely patent to their distal aspects without stenosis. No M1 stenosis or occlusion. Normal MCA bifurcations. Distal MCA branches well perfused and symmetric. Posterior circulation: Both V4 segments patent to the vertebrobasilar junction without stenosis. Neither PICA origin well visualized. Basilar widely patent to its distal aspect without stenosis. Superior cerebellar arteries patent bilaterally. Both PCAs primarily supplied via the basilar and are well perfused to there distal aspects. Venous sinuses: Patent allowing for timing contrast bolus. Anatomic variants: None significant.  No intracranial aneurysm. Review of the MIP images confirms the above findings IMPRESSION: CT HEAD IMPRESSION: 1. No acute intracranial abnormality. Recently identified signal changes at the left parieto-occipital region not visible by CT. 2. Age-related cerebral atrophy with mild  chronic small vessel ischemic disease. CTA HEAD AND NECK IMPRESSION: Negative CTA of the head and neck. No large vessel occlusion, hemodynamically significant stenosis, or other acute vascular abnormality. Electronically Signed   By: BJeannine BogaM.D.   On: 11/21/2020 23:22   DG Abd 1 View  Result Date: 11/21/2020 CLINICAL DATA:  Nausea EXAM: ABDOMEN - 1 VIEW COMPARISON:  None. FINDINGS: There is diffuse stool throughout colon. No bowel dilatation or air-fluid level to suggest bowel obstruction. No free air. There are pelvic calcifications consistent with phleboliths. Visualized lung bases  clear. IMPRESSION: Diffuse stool throughout colon. Question a degree of underlying constipation. No bowel obstruction or free air. Electronically Signed   By: Lowella Grip III M.D.   On: 11/21/2020 12:18   CT HEAD WO CONTRAST  Result Date: 11/17/2020 CLINICAL DATA:  Altered mental status. EXAM: CT HEAD WITHOUT CONTRAST TECHNIQUE: Contiguous axial images were obtained from the base of the skull through the vertex without intravenous contrast. COMPARISON:  May 05, 2020 FINDINGS: Brain: There is mild cerebral atrophy with widening of the extra-axial spaces and ventricular dilatation. There are areas of decreased attenuation within the white matter tracts of the supratentorial brain, consistent with microvascular disease changes. Vascular: No hyperdense vessel or unexpected calcification. Skull: Normal. Negative for fracture or focal lesion. Sinuses/Orbits: Mild bilateral ethmoid sinus mucosal thickening is seen. Other: The study is limited secondary to patient motion. IMPRESSION: 1. Generalized cerebral atrophy. 2. No acute intracranial abnormality. Electronically Signed   By: Virgina Norfolk M.D.   On: 11/17/2020 17:17   CT ANGIO NECK W OR WO CONTRAST  Result Date: 11/21/2020 CLINICAL DATA:  Initial evaluation for neuro deficit, stroke suspected. EXAM: CT ANGIOGRAPHY HEAD AND NECK TECHNIQUE:  Multidetector CT imaging of the head and neck was performed using the standard protocol during bolus administration of intravenous contrast. Multiplanar CT image reconstructions and MIPs were obtained to evaluate the vascular anatomy. Carotid stenosis measurements (when applicable) are obtained utilizing NASCET criteria, using the distal internal carotid diameter as the denominator. CONTRAST:  17m OMNIPAQUE IOHEXOL 350 MG/ML SOLN COMPARISON:  Prior MRI from 11/19/2020. FINDINGS: CT HEAD FINDINGS Brain: Age-related cerebral atrophy with mild chronic small vessel ischemic disease. Recently identified signal changes involving the left parieto-occipital region not visible by CT. No acute intracranial hemorrhage. No acute large vessel territory infarct. No mass lesion or midline shift. No hydrocephalus or extra-axial fluid collection. Vascular: No hyperdense vessel. Scattered vascular calcifications noted within the carotid siphons. Skull: Scalp soft tissues and calvarium within normal limits. Sinuses: Scattered mucosal thickening noted within the ethmoidal air cells. Small air-fluid level noted within the right sphenoid sinus. Paranasal sinuses are otherwise clear. No mastoid effusion. Orbits: Globes and orbital soft tissues within normal limits. Review of the MIP images confirms the above findings CTA NECK FINDINGS Aortic arch: Visualized aortic arch within normal limits for caliber with normal 3 vessel morphology. No hemodynamically significant stenosis about the origin the great vessels. Right carotid system: Right common and internal carotid arteries widely patent without stenosis, dissection or occlusion. Left carotid system: Left common and internal carotid arteries widely patent without stenosis, dissection or occlusion. Vertebral arteries: Both vertebral arteries arise from subclavian arteries. No proximal subclavian artery stenosis. Moderate narrowing of the left V2 segment at the level of C3 related to an  extrinsic compression from adjacent uncovertebral disease. Vertebral arteries otherwise widely patent without stenosis, dissection or occlusion. Skeleton: No acute osseous finding. No discrete or worrisome osseous lesions. Hyperostosis frontalis interna noted. Other neck: No other acute soft tissue abnormality within the neck. No mass or adenopathy. Upper chest: Which lies upper chest demonstrates no acute finding. Review of the MIP images confirms the above findings CTA HEAD FINDINGS Anterior circulation: Both internal carotid arteries widely patent to the termini without stenosis. A1 segments widely patent. Normal anterior communicating artery complex. Both anterior cerebral arteries widely patent to their distal aspects without stenosis. No M1 stenosis or occlusion. Normal MCA bifurcations. Distal MCA branches well perfused and symmetric. Posterior circulation: Both V4 segments patent to the vertebrobasilar junction  without stenosis. Neither PICA origin well visualized. Basilar widely patent to its distal aspect without stenosis. Superior cerebellar arteries patent bilaterally. Both PCAs primarily supplied via the basilar and are well perfused to there distal aspects. Venous sinuses: Patent allowing for timing contrast bolus. Anatomic variants: None significant.  No intracranial aneurysm. Review of the MIP images confirms the above findings IMPRESSION: CT HEAD IMPRESSION: 1. No acute intracranial abnormality. Recently identified signal changes at the left parieto-occipital region not visible by CT. 2. Age-related cerebral atrophy with mild chronic small vessel ischemic disease. CTA HEAD AND NECK IMPRESSION: Negative CTA of the head and neck. No large vessel occlusion, hemodynamically significant stenosis, or other acute vascular abnormality. Electronically Signed   By: Jeannine Boga M.D.   On: 11/21/2020 23:22   CT Chest W Contrast  Result Date: 11/17/2020 CLINICAL DATA:  Fever, altered mental status.   Nausea and vomiting. EXAM: CT CHEST, ABDOMEN, AND PELVIS WITH CONTRAST TECHNIQUE: Multidetector CT imaging of the chest, abdomen and pelvis was performed following the standard protocol during bolus administration of intravenous contrast. CONTRAST:  19m OMNIPAQUE IOHEXOL 300 MG/ML  SOLN COMPARISON:  CT abdomen pelvis 12/20/2019, CT angiography chest 12/24/2019 FINDINGS: CT CHEST FINDINGS Cardiovascular: Normal heart size. No significant pericardial effusion. The thoracic aorta is normal in caliber. Mild atherosclerotic plaque of the thoracic aorta. The main pulmonary artery measures at the upper limits of normal: 3.2 cm. Mediastinum/Nodes: No enlarged mediastinal, hilar, or axillary lymph nodes. Thyroid gland, trachea, and esophagus demonstrate no significant findings. Lungs/Pleura: Right lower lobe atelectasis with persistent elevated right hemidiaphragm. Limited evaluation for pulmonary nodule due to respiratory motion artifact. No pulmonary mass. No focal consolidation. No pleural effusion. No pneumothorax. Musculoskeletal: No chest wall abnormality. No suspicious lytic or blastic osseous lesions. No acute displaced fracture. Multilevel degenerative changes of the spine. CT ABDOMEN PELVIS FINDINGS Hepatobiliary: No focal liver abnormality. Gallstone noted within the gallbladder lumen. No gallbladder wall thickening or pericholecystic fluid. No biliary dilatation. Pancreas: No focal lesion. Normal pancreatic contour. No surrounding inflammatory changes. No main pancreatic ductal dilatation. Spleen: Normal in size without focal abnormality. Adrenals/Urinary Tract: No adrenal nodule bilaterally. Bilateral kidneys enhance symmetrically. Exophytic hypodense 2.2 cm lesion within the right kidney likely represent simple renal cysts. Slightly increased density likely due to streak artifact at this level. No hydronephrosis. No hydroureter. The urinary bladder is unremarkable. On delayed imaging, there is no urothelial  wall thickening and there are no filling defects in the opacified portions of the bilateral collecting systems or ureters. Stomach/Bowel: Stomach is within normal limits. No evidence of bowel wall thickening or dilatation. 8 cm stool ball within the rectum. The appendix not definitely identified. Vascular/Lymphatic: No abdominal aorta or iliac aneurysm. Mild atherosclerotic plaque of the aorta and its branches. No abdominal, pelvic, or inguinal lymphadenopathy. Reproductive: Difficult to measure grossly stable uterine fibroids. No adnexal lesion. Other: No intraperitoneal free fluid. No intraperitoneal free gas. No organized fluid collection. Musculoskeletal: Tiny fat containing left inguinal hernia. No suspicious lytic or blastic osseous lesions. No acute displaced fracture. Multilevel degenerative changes of the spine. IMPRESSION: 1. Main pulmonary artery measures at the upper limits of normal. Correlate with pulmonary hypertension. 2. Otherwise no acute intrathoracic abnormality. 3. Cholelithiasis with no CT findings of acute cholecystitis. 4. An 8 cm stool ball within the rectum with no associated findings suggest of stercoral colitis. 5. 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 Electronically Signed   By: MIven FinnM.D.   On: 11/17/2020 20:07   MR  BRAIN W WO CONTRAST  Result Date: 11/19/2020 CLINICAL DATA:  Mental status change.  Encephalopathy EXAM: MRI HEAD WITHOUT AND WITH CONTRAST TECHNIQUE: Multiplanar, multiecho pulse sequences of the brain and surrounding structures were obtained without and with intravenous contrast. CONTRAST:  7.46m GADAVIST GADOBUTROL 1 MMOL/ML IV SOLN COMPARISON:  CT head 11/17/2020.  MRI head 12/24/2019 FINDINGS: Brain: Patchy areas of hyperintensity in the left occipital pole extending into the left posterior parietal lobe best seen on FLAIR. This involves primarily the white matter but some areas of cortex. This area does not show restricted diffusion or susceptibility.  This area was normal on the prior MRI 2021. No abnormal enhancement. Moderate atrophy. Mild white matter changes consistent with chronic microvascular ischemia. Diffusion-weighted imaging negative for acute infarct. No hemorrhage or mass lesion. Vascular: Normal arterial flow voids Skull and upper cervical spine: Negative Sinuses/Orbits: Mild mucosal edema paranasal sinuses. Negative orbit Other: None IMPRESSION: Ill-defined patchy hyperintensity in the left occipital parietal lobe without restricted diffusion or abnormal enhancement. Differential diagnosis includes late subacute infarct, encephalitis. Tumor not considered likely. Correlate with symptoms. Electronically Signed   By: CFranchot GalloM.D.   On: 11/19/2020 15:24   CT Abdomen Pelvis W Contrast  Result Date: 11/17/2020 CLINICAL DATA:  Fever, altered mental status.  Nausea and vomiting. EXAM: CT CHEST, ABDOMEN, AND PELVIS WITH CONTRAST TECHNIQUE: Multidetector CT imaging of the chest, abdomen and pelvis was performed following the standard protocol during bolus administration of intravenous contrast. CONTRAST:  1037mOMNIPAQUE IOHEXOL 300 MG/ML  SOLN COMPARISON:  CT abdomen pelvis 12/20/2019, CT angiography chest 12/24/2019 FINDINGS: CT CHEST FINDINGS Cardiovascular: Normal heart size. No significant pericardial effusion. The thoracic aorta is normal in caliber. Mild atherosclerotic plaque of the thoracic aorta. The main pulmonary artery measures at the upper limits of normal: 3.2 cm. Mediastinum/Nodes: No enlarged mediastinal, hilar, or axillary lymph nodes. Thyroid gland, trachea, and esophagus demonstrate no significant findings. Lungs/Pleura: Right lower lobe atelectasis with persistent elevated right hemidiaphragm. Limited evaluation for pulmonary nodule due to respiratory motion artifact. No pulmonary mass. No focal consolidation. No pleural effusion. No pneumothorax. Musculoskeletal: No chest wall abnormality. No suspicious lytic or blastic  osseous lesions. No acute displaced fracture. Multilevel degenerative changes of the spine. CT ABDOMEN PELVIS FINDINGS Hepatobiliary: No focal liver abnormality. Gallstone noted within the gallbladder lumen. No gallbladder wall thickening or pericholecystic fluid. No biliary dilatation. Pancreas: No focal lesion. Normal pancreatic contour. No surrounding inflammatory changes. No main pancreatic ductal dilatation. Spleen: Normal in size without focal abnormality. Adrenals/Urinary Tract: No adrenal nodule bilaterally. Bilateral kidneys enhance symmetrically. Exophytic hypodense 2.2 cm lesion within the right kidney likely represent simple renal cysts. Slightly increased density likely due to streak artifact at this level. No hydronephrosis. No hydroureter. The urinary bladder is unremarkable. On delayed imaging, there is no urothelial wall thickening and there are no filling defects in the opacified portions of the bilateral collecting systems or ureters. Stomach/Bowel: Stomach is within normal limits. No evidence of bowel wall thickening or dilatation. 8 cm stool ball within the rectum. The appendix not definitely identified. Vascular/Lymphatic: No abdominal aorta or iliac aneurysm. Mild atherosclerotic plaque of the aorta and its branches. No abdominal, pelvic, or inguinal lymphadenopathy. Reproductive: Difficult to measure grossly stable uterine fibroids. No adnexal lesion. Other: No intraperitoneal free fluid. No intraperitoneal free gas. No organized fluid collection. Musculoskeletal: Tiny fat containing left inguinal hernia. No suspicious lytic or blastic osseous lesions. No acute displaced fracture. Multilevel degenerative changes of the spine. IMPRESSION: 1. Main  pulmonary artery measures at the upper limits of normal. Correlate with pulmonary hypertension. 2. Otherwise no acute intrathoracic abnormality. 3. Cholelithiasis with no CT findings of acute cholecystitis. 4. An 8 cm stool ball within the rectum with  no associated findings suggest of stercoral colitis. 5. 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 Electronically Signed   By: Iven Finn M.D.   On: 11/17/2020 20:07   IR Fluoro Guide CV Line Right  Result Date: 11/25/2020 INDICATION: 75 year old female with dementia and infection requiring intravenous antibiotics. She has repeatedly pulled out every IV that has been placed. She requires durable venous access that is well secured. Therefore, she presents for placement of a tunneled central venous catheter. EXAM: IR ULTRASOUND GUIDANCE VASC ACCESS RIGHT; IR RIGHT FLUORO GUIDE CV LINE MEDICATIONS: None ANESTHESIA/SEDATION: None FLUOROSCOPY TIME:  Fluoroscopy Time: 0 minutes 12 seconds (5 mGy). COMPLICATIONS: None immediate. PROCEDURE: Informed written consent was obtained from the patient after a thorough discussion of the procedural risks, benefits and alternatives. All questions were addressed. Maximal Sterile Barrier Technique was utilized including caps, mask, sterile gowns, sterile gloves, sterile drape, hand hygiene and skin antiseptic. A timeout was performed prior to the initiation of the procedure. The right internal jugular vein was interrogated with ultrasound and found to be widely patent. An image was obtained and stored for the medical record. Local anesthesia was attained by infiltration with 1% lidocaine. A small dermatotomy was made. Under real-time sonographic guidance, the vessel was punctured with a 21 gauge micropuncture needle. Using standard technique, the initial micro needle was exchanged over a 0.018 micro wire for a peel-away sheath. A suitable skin exit site inferior to the clavicle was selected. Local anesthesia was again attained by infiltration with 1% lidocaine. A small dermatotomy was made. The tunneled central venous catheter was then tunneled from the skin exit site to the dermatotomy overlying the venous access site. The catheter was cut to 22 cm (14 cm intravascular length)  and advanced through the peel-away sheath. An image was obtained for the medical record documenting the tip of the catheter in the upper right atrium. The catheter was then capped and flushed. The catheter was secured to the skin at 3 locations using 0 Prolene suture. The dermatotomy over the venous access site was closed with Dermabond. Sterile bandages were placed. IMPRESSION: Successful placement of a power injectable dual lumen tunneled central venous catheter via the right internal jugular vein. The catheter tip is in the upper right atrium and the catheter is ready for immediate use. Electronically Signed   By: Jacqulynn Cadet M.D.   On: 11/25/2020 17:07   IR US Guide Vasc Access Right  Result Date: 11/25/2020 INDICATION: 75 year old female with dementia and infection requiring intravenous antibiotics. She has repeatedly pulled out every IV that has been placed. She requires durable venous access that is well secured. Therefore, she presents for placement of a tunneled central venous catheter. EXAM: IR ULTRASOUND GUIDANCE VASC ACCESS RIGHT; IR RIGHT FLUORO GUIDE CV LINE MEDICATIONS: None ANESTHESIA/SEDATION: None FLUOROSCOPY TIME:  Fluoroscopy Time: 0 minutes 12 seconds (5 mGy). COMPLICATIONS: None immediate. PROCEDURE: Informed written consent was obtained from the patient after a thorough discussion of the procedural risks, benefits and alternatives. All questions were addressed. Maximal Sterile Barrier Technique was utilized including caps, mask, sterile gowns, sterile gloves, sterile drape, hand hygiene and skin antiseptic. A timeout was performed prior to the initiation of the procedure. The right internal jugular vein was interrogated with ultrasound and found to be widely  patent. An image was obtained and stored for the medical record. Local anesthesia was attained by infiltration with 1% lidocaine. A small dermatotomy was made. Under real-time sonographic guidance, the vessel was punctured with a  21 gauge micropuncture needle. Using standard technique, the initial micro needle was exchanged over a 0.018 micro wire for a peel-away sheath. A suitable skin exit site inferior to the clavicle was selected. Local anesthesia was again attained by infiltration with 1% lidocaine. A small dermatotomy was made. The tunneled central venous catheter was then tunneled from the skin exit site to the dermatotomy overlying the venous access site. The catheter was cut to 22 cm (14 cm intravascular length) and advanced through the peel-away sheath. An image was obtained for the medical record documenting the tip of the catheter in the upper right atrium. The catheter was then capped and flushed. The catheter was secured to the skin at 3 locations using 0 Prolene suture. The dermatotomy over the venous access site was closed with Dermabond. Sterile bandages were placed. IMPRESSION: Successful placement of a power injectable dual lumen tunneled central venous catheter via the right internal jugular vein. The catheter tip is in the upper right atrium and the catheter is ready for immediate use. Electronically Signed   By: Jacqulynn Cadet M.D.   On: 11/25/2020 17:07   EEG adult  Result Date: 11/21/2020 Plancher, Baron Sane, MD     11/21/2020  3:50 PM PROCEDURE: EEG routine 22 minutes with video study  DATES OF TEST: 11/21/2020  REASON FOR TEST: acute encephalopathy  AED/Sedative MEDICATIONS: n/a  TECHNIQUE: This is an 18 channel digital EEG recording using the standard international 10/20 system of electrode placement with one channel EKG recording. Pt was awake during this recording.  FINDINGS: Background rhythm: symmetric diffuse 4-8 Hz, mostly myogenic artifact. Amplitude :  low Continuity: continuous Breach effect:  NO Variability: YES Reactivity: YES Rhythmic delta activity: NO Periodic discharges: NO Sporadic epileptiform discharges: NO Electrographic/electroclinical seizures: NO  Limited EKG reveals no abnormalities.    IMPRESSION: This is an abnormal study due to - 1. Moderate to mild Generalized slowing is a non-specific finding consistent with a generalized disturbance of cerebral functioning including toxic, metabolic, or structural abnormalities that are multi-focal or diffuse. 2. No definite epileptiform discharges or electrographic seizures noted.    ECHOCARDIOGRAM COMPLETE  Result Date: 11/19/2020    ECHOCARDIOGRAM REPORT   Patient Name:   HALLEIGH COMES Date of Exam: 11/19/2020 Medical Rec #:  [id number]      Height:       66.0 in Accession #:    [id number]     Weight:       195.2 lb Date of Birth:  February 21, 1946       BSA:          1.979 m Patient Age:    36 years       BP:           145/76 mmHg Patient Gender: F              HR:           75 bpm. Exam Location:  Inpatient Procedure: 2D Echo Indications:    bacteremia  History:        Patient has prior history of Echocardiogram examinations, most                 recent 12/23/2019. CHF, Arrythmias:Atrial Fibrillation,  Signs/Symptoms:Bacteremia; Risk Factors:Diabetes.  Sonographer:    Johny Chess Referring Phys: [id number] SHFWY LATIF [facility]  Sonographer Comments: Image acquisition challenging due to uncooperative patient and Image acquisition challenging due to patient body habitus. IMPRESSIONS  1. Limited study due to poor acoustic windows. Left ventricular ejection fraction, by estimation, is 60 to 65%. The left ventricle has normal function. The left ventricle has no regional wall motion abnormalities. There is mild concentric left ventricular hypertrophy. Left ventricular diastolic parameters are consistent with Grade I diastolic dysfunction (impaired relaxation).  2. Right ventricular systolic function is mildly reduced. The right ventricular size is mildly-to-moderately enlarged.  3. The mitral valve is normal in structure. Trivial mitral valve regurgitation. No evidence of mitral stenosis.  4. The aortic valve is tricuspid. There is mild calcification  of the aortic valve. There is mild thickening of the aortic valve. Aortic valve regurgitation is not visualized. Mild aortic valve sclerosis is present, with no evidence of aortic valve stenosis.  5. No valvular vegetations visualized on surface echo. Pending clinical suspicion for infective endocarditis, can consider TEE for further evaluation. Comparison(s): Compared to prior TTE on 63/78/58, the RV systolic function appears mildly improved. Otherwise there is no significant change. Conclusion(s)/Recommendation(s): No evidence of valvular vegetations on this transthoracic echocardiogram. Would recommend a transesophageal echocardiogram to exclude infective endocarditis if clinically indicated. FINDINGS  Left Ventricle: Limited study due to poor acoustic windows. Left ventricular ejection fraction, by estimation, is 60 to 65%. The left ventricle has normal function. The left ventricle has no regional wall motion abnormalities. Definity contrast agent was given IV to delineate the left ventricular endocardial borders. The left ventricular internal cavity size was normal in size. There is mild concentric left ventricular hypertrophy. Left ventricular diastolic parameters are consistent with Grade I diastolic dysfunction (impaired relaxation). Right Ventricle: The right ventricular size is mildly-to-moderately enlarged. Right vetricular wall thickness was not well visualized. Right ventricular systolic function is mildly reduced. Left Atrium: Left atrial size was normal in size. Right Atrium: Right atrial size was normal in size. Pericardium: There is no evidence of pericardial effusion. Mitral Valve: The mitral valve is normal in structure. There is mild thickening of the mitral valve leaflet(s). There is mild calcification of the mitral valve leaflet(s). Trivial mitral valve regurgitation. No evidence of mitral valve stenosis. Tricuspid Valve: The tricuspid valve is normal in structure. Tricuspid valve regurgitation  is trivial. Aortic Valve: The aortic valve is tricuspid. There is mild calcification of the aortic valve. There is mild thickening of the aortic valve. Aortic valve regurgitation is not visualized. Mild aortic valve sclerosis is present, with no evidence of aortic valve stenosis. Pulmonic Valve: The pulmonic valve was not well visualized. Pulmonic valve regurgitation is not visualized. Aorta: The aortic root and ascending aorta are structurally normal, with no evidence of dilitation. IAS/Shunts: No atrial level shunt detected by color flow Doppler.  LEFT VENTRICLE PLAX 2D LVIDd:         4.26 cm LVIDs:         2.71 cm LV PW:         1.17 cm LV IVS:        1.06 cm LVOT diam:     1.80 cm LVOT Area:     2.54 cm  RIGHT VENTRICLE RV S prime:     9.90 cm/s LEFT ATRIUM         Index LA diam:    3.80 cm 1.92 cm/m   AORTA Ao Root diam: 2.90 cm Ao  Asc diam:  3.40 cm  SHUNTS Systemic Diam: 1.80 cm Gwyndolyn Kaufman MD Electronically signed by Gwyndolyn Kaufman MD Signature Date/Time: 11/19/2020/5:00:20 PM    Final    [place] EKG SITE RITE  Result Date: 11/23/2020 If Site Rite image not attached, placement could not be confirmed due to current cardiac rhythm.  DG FL GUIDED LUMBAR PUNCTURE  Result Date: 11/18/2020 CLINICAL DATA:  Encephalopathy.  Fever.  Rule out meningitis EXAM: DIAGNOSTIC LUMBAR PUNCTURE UNDER FLUOROSCOPIC GUIDANCE COMPARISON:  CT abdomen pelvis 11/17/2020 FLUOROSCOPY TIME:  Fluoroscopy Time:  0 minutes 48 seconds Radiation Exposure Index (if provided by the fluoroscopic device): Number of Acquired Spot Images: 1 PROCEDURE: Patient was sedated prior to the study. The patient was uncooperative. Informed consent was obtained from the patient's brother by telephone prior to the procedure, including potential complications of headache, allergy, and pain. With the patient prone, the lower back was prepped with Betadine. 1% Lidocaine was used for local anesthesia. Lumbar puncture was performed at the L2-3 level  using a 20 gauge needle with return of clear CSF with an opening pressure of 13 cm water. Six ml of CSF were obtained for laboratory studies. The patient tolerated the procedure well and there were no apparent complications. IMPRESSION: Successful lumbar puncture.  Clear CSF. Electronically Signed   By: Franchot Gallo M.D.   On: 11/18/2020 14:20

## 2020-11-27 ENCOUNTER — Inpatient Hospital Stay (HOSPITAL_COMMUNITY): Payer: Medicare Other

## 2020-11-27 DIAGNOSIS — F3111 Bipolar disorder, current episode manic without psychotic features, mild: Secondary | ICD-10-CM | POA: Diagnosis not present

## 2020-11-27 LAB — CBC WITH DIFFERENTIAL/PLATELET
Abs Immature Granulocytes: 0.07 10*3/uL (ref 0.00–0.07)
Basophils Absolute: 0 10*3/uL (ref 0.0–0.1)
Basophils Relative: 0 %
Eosinophils Absolute: 0.4 10*3/uL (ref 0.0–0.5)
Eosinophils Relative: 3 %
HCT: 35.7 % — ABNORMAL LOW (ref 36.0–46.0)
Hemoglobin: 11.1 g/dL — ABNORMAL LOW (ref 12.0–15.0)
Immature Granulocytes: 1 %
Lymphocytes Relative: 38 %
Lymphs Abs: 4.4 10*3/uL — ABNORMAL HIGH (ref 0.7–4.0)
MCH: 28.9 pg (ref 26.0–34.0)
MCHC: 31.1 g/dL (ref 30.0–36.0)
MCV: 93 fL (ref 80.0–100.0)
Monocytes Absolute: 1.1 10*3/uL — ABNORMAL HIGH (ref 0.1–1.0)
Monocytes Relative: 10 %
Neutro Abs: 5.5 10*3/uL (ref 1.7–7.7)
Neutrophils Relative %: 48 %
Platelets: 212 10*3/uL (ref 150–400)
RBC: 3.84 MIL/uL — ABNORMAL LOW (ref 3.87–5.11)
RDW: 14.5 % (ref 11.5–15.5)
WBC: 11.5 10*3/uL — ABNORMAL HIGH (ref 4.0–10.5)
nRBC: 0 % (ref 0.0–0.2)

## 2020-11-27 LAB — GLUCOSE, CAPILLARY
Glucose-Capillary: 146 mg/dL — ABNORMAL HIGH (ref 70–99)
Glucose-Capillary: 147 mg/dL — ABNORMAL HIGH (ref 70–99)
Glucose-Capillary: 154 mg/dL — ABNORMAL HIGH (ref 70–99)
Glucose-Capillary: 222 mg/dL — ABNORMAL HIGH (ref 70–99)

## 2020-11-27 LAB — COMPREHENSIVE METABOLIC PANEL
ALT: 12 U/L (ref 0–44)
AST: 9 U/L — ABNORMAL LOW (ref 15–41)
Albumin: 2.3 g/dL — ABNORMAL LOW (ref 3.5–5.0)
Alkaline Phosphatase: 49 U/L (ref 38–126)
Anion gap: 5 (ref 5–15)
BUN: 15 mg/dL (ref 8–23)
CO2: 33 mmol/L — ABNORMAL HIGH (ref 22–32)
Calcium: 8.5 mg/dL — ABNORMAL LOW (ref 8.9–10.3)
Chloride: 102 mmol/L (ref 98–111)
Creatinine, Ser: 0.81 mg/dL (ref 0.44–1.00)
GFR, Estimated: >60 mL/min (ref >60)
Glucose, Bld: 153 mg/dL — ABNORMAL HIGH (ref 70–99)
Potassium: 4.9 mmol/L (ref 3.5–5.1)
Sodium: 140 mmol/L (ref 135–145)
Total Bilirubin: 0.3 mg/dL (ref 0.3–1.2)
Total Protein: 4.8 g/dL — ABNORMAL LOW (ref 6.5–8.1)

## 2020-11-27 LAB — PROCALCITONIN: Procalcitonin: <0.1 ng/mL

## 2020-11-27 LAB — C-REACTIVE PROTEIN: CRP: 1 mg/dL — ABNORMAL HIGH (ref <1.0)

## 2020-11-27 LAB — BRAIN NATRIURETIC PEPTIDE: B Natriuretic Peptide: 23 pg/mL (ref 0.0–100.0)

## 2020-11-27 LAB — MAGNESIUM: Magnesium: 2 mg/dL (ref 1.7–2.4)

## 2020-11-27 IMAGING — DX DG ABDOMEN 1V
2 series · 2 of 2 positions shown · non-contrast
Comparison: None.

CLINICAL DATA: Nausea

EXAM:
ABDOMEN - 1 VIEW

[abdomen supine (1 of 2)]
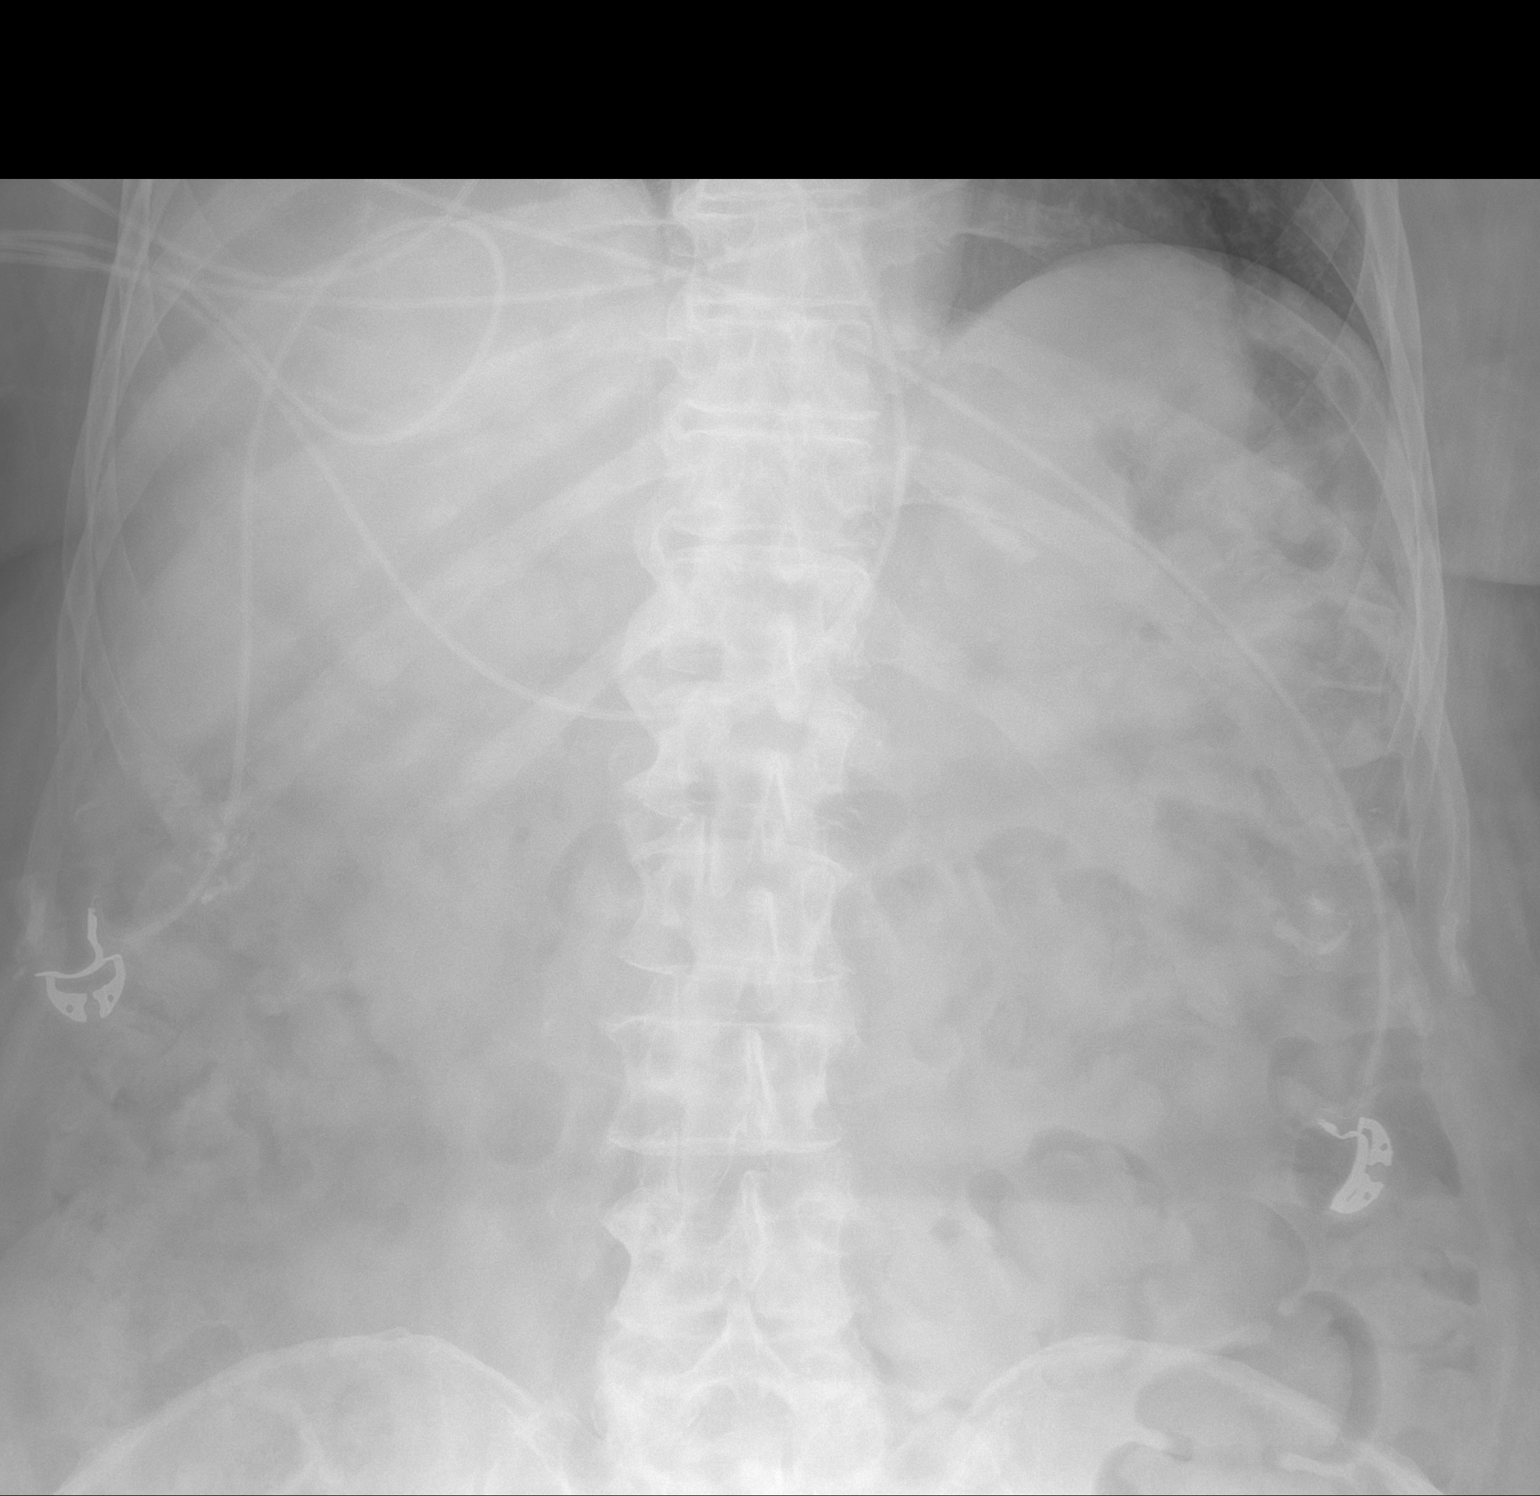

[abdomen supine (2 of 2)]
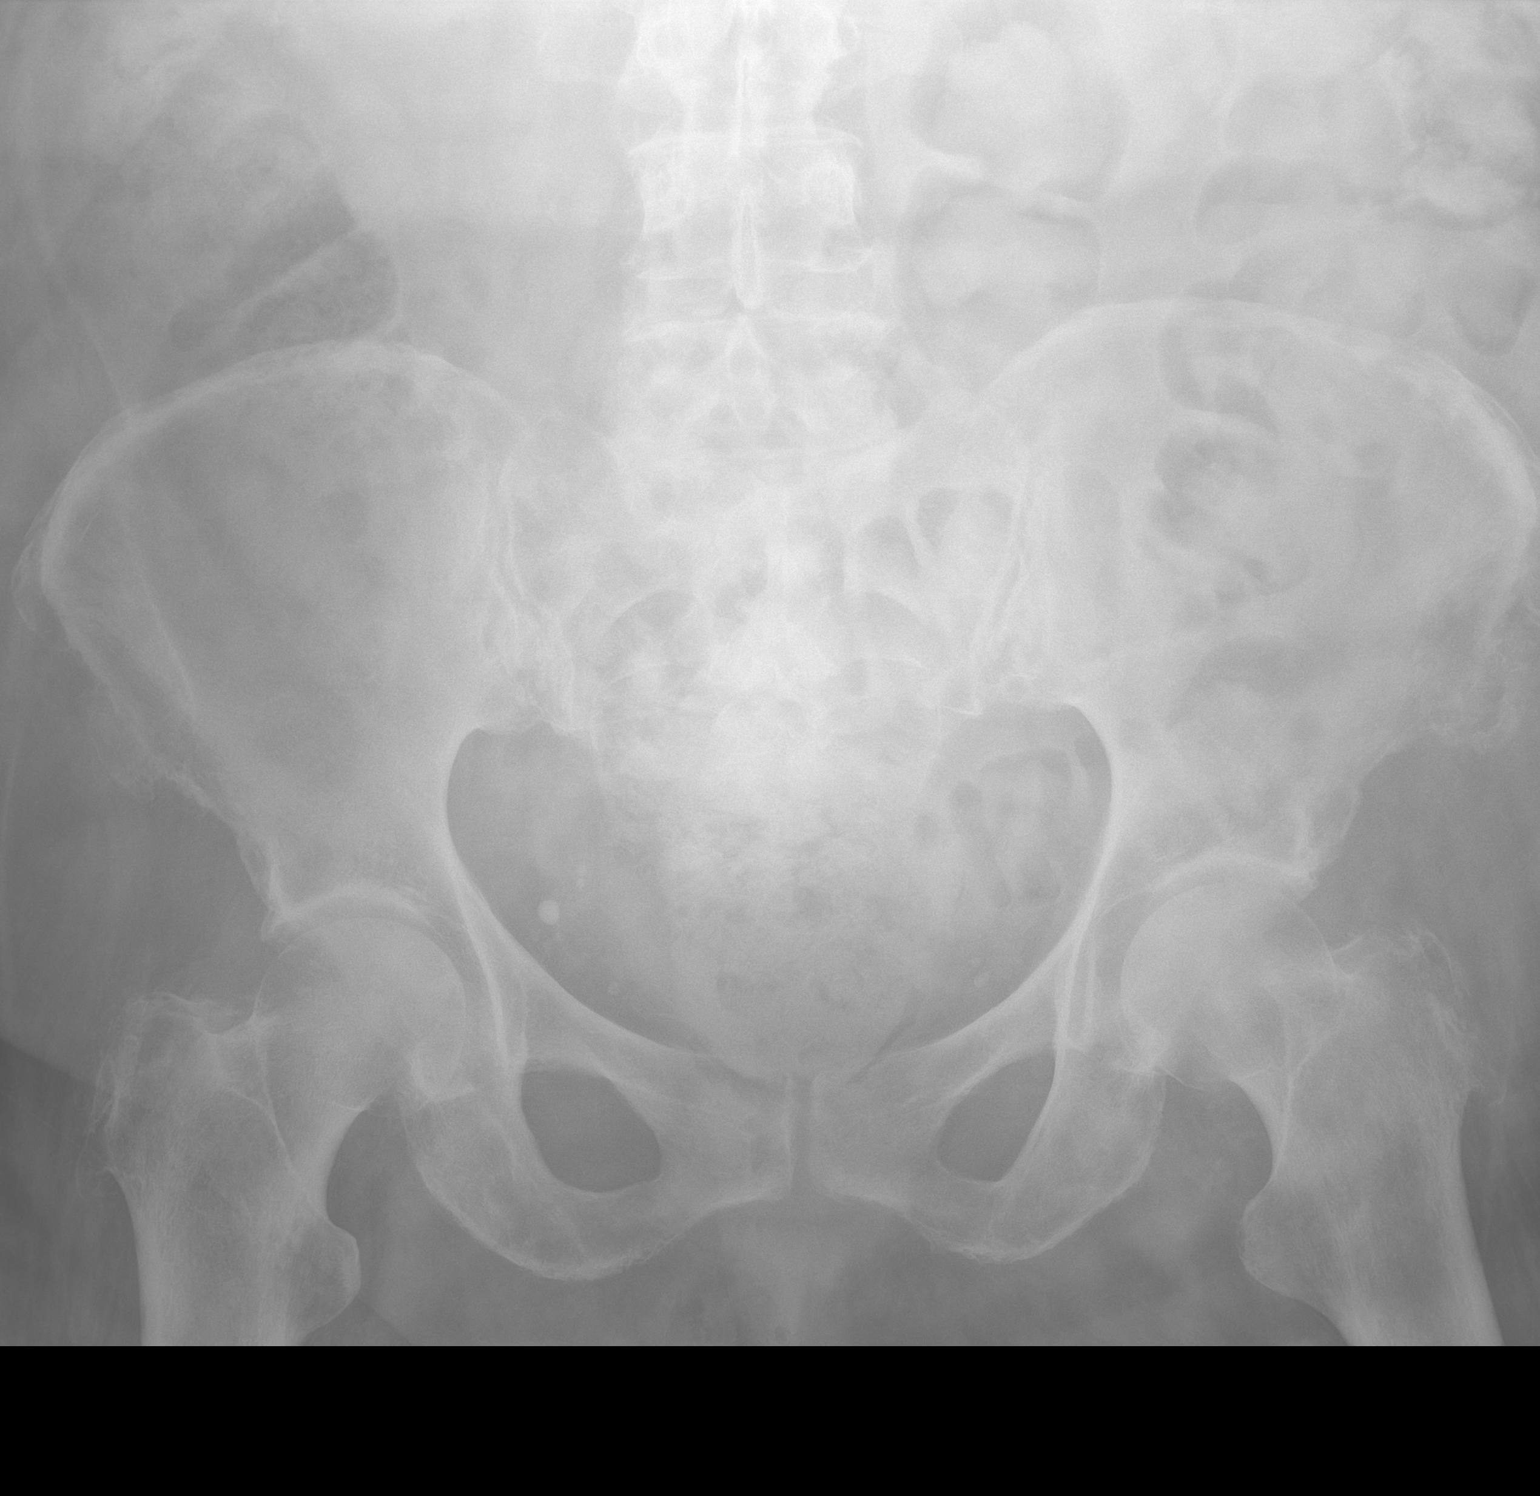

[2 of 2 positions shown; findings below may reference images not displayed]

FINDINGS: The bowel gas pattern is normal. Extensive bowel content is
identified throughout colon. No radio-opaque calculi or other
significant radiographic abnormality are seen.
IMPRESSION: No bowel obstruction. Constipation.

## 2020-11-27 IMAGING — DX DG CHEST 1V PORT
1 series · 1 of 1 positions shown · non-contrast
Comparison: [DATE]

CLINICAL DATA: Shortness of breath

EXAM:
PORTABLE CHEST 1 VIEW

[chest ap]
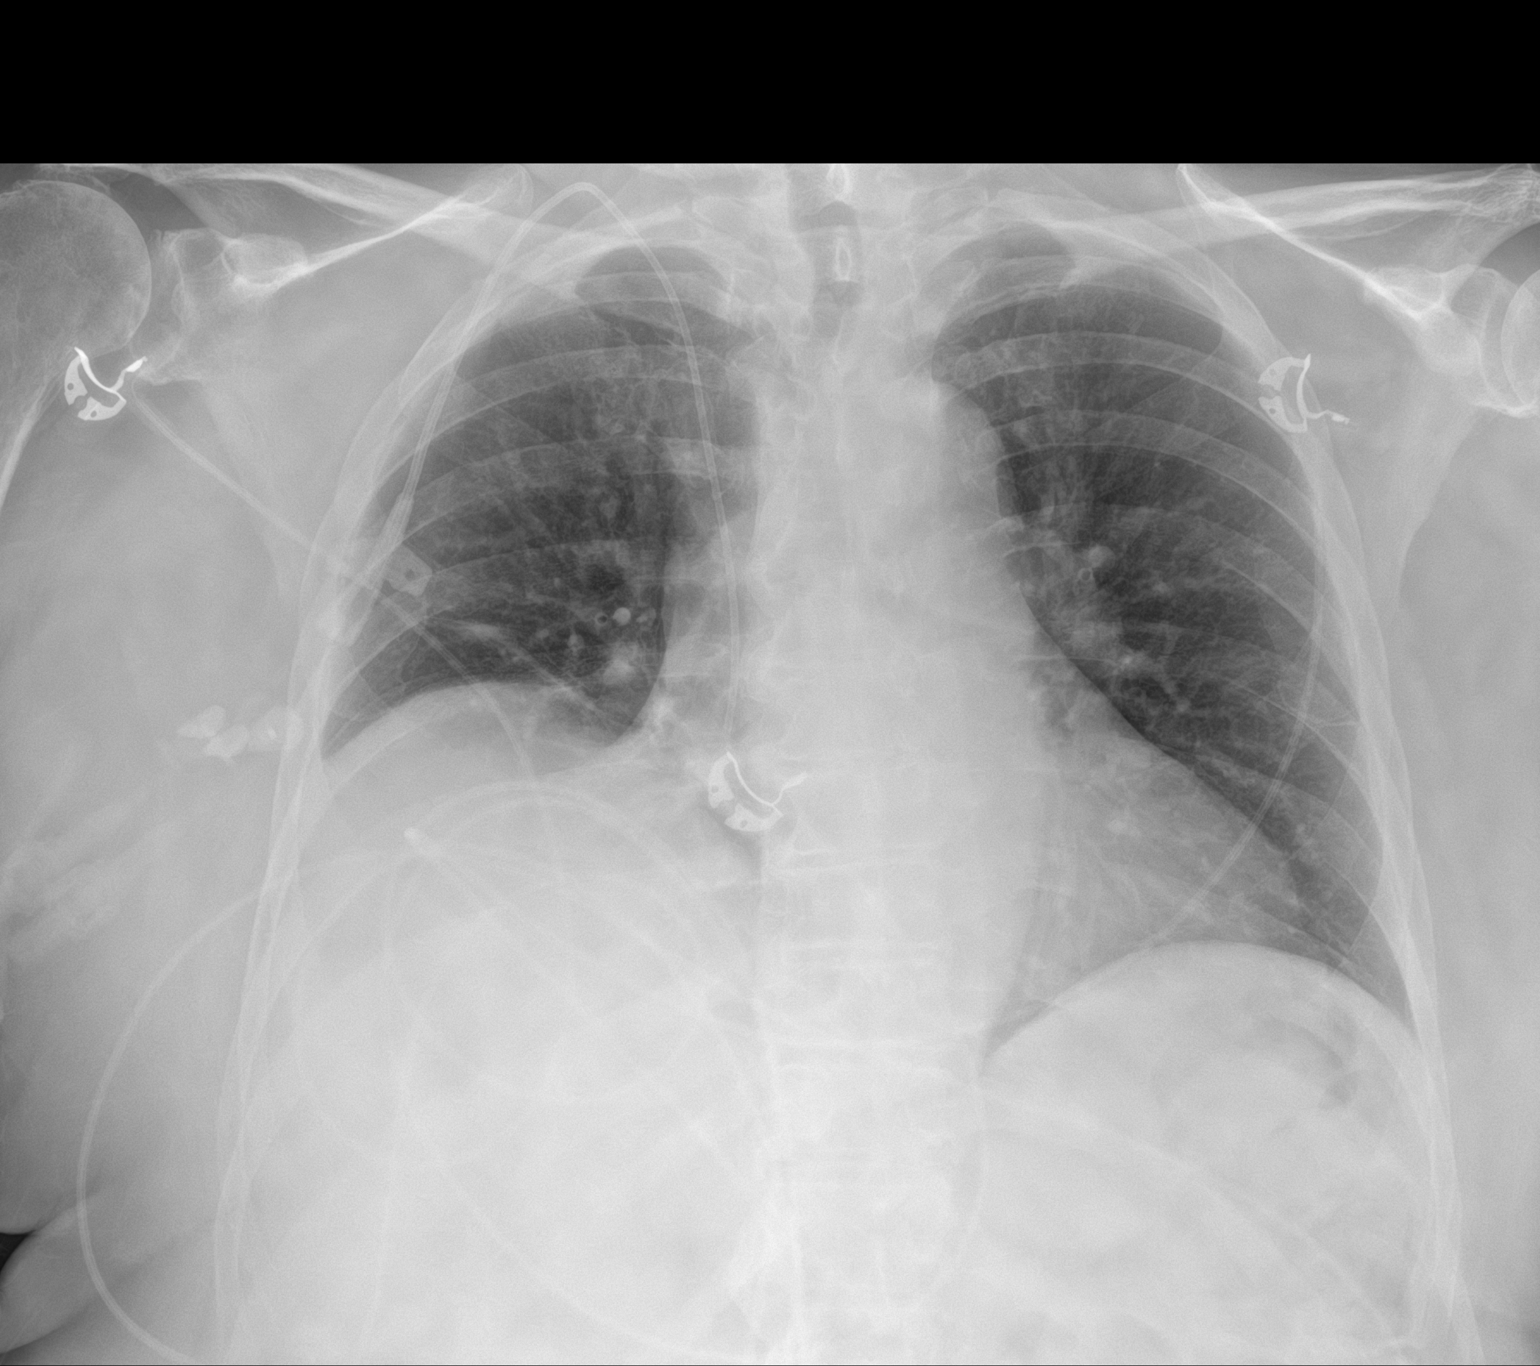

[1 of 1 positions shown; findings below may reference images not displayed]

FINDINGS: The heart size and mediastinal contours are stable. Right central
venous line is identified with distal tip in the superior vena cava.
There is chronic elevation of right hemidiaphragm. Mild atelectasis
of right lung base is noted. The left lung is clear. The visualized
skeletal structures are unremarkable.
IMPRESSION: Mild atelectasis of right lung base.

## 2020-11-27 MED ORDER — AMLODIPINE BESYLATE 10 MG PO TABS
10.0000 mg | ORAL_TABLET | Freq: Every day | ORAL | Status: DC
  Administered 2020-11-27 – 2020-12-02 (×6): 10 mg via ORAL
  Filled 2020-11-27 (×6): qty 1

## 2020-11-27 NOTE — Progress Notes (Signed)
PROGRESS NOTE                                                                                                                                                                                                             Patient Demographics:    Becky Hendricks, is a 75 y.o. female, DOB - 06-14-1946, UUE:[id number]  Outpatient Primary MD for the patient is [name], MD    LOS - 9  Admit date - 11/17/2020    Chief Complaint  Patient presents with  . ivc  . 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  So far LP, CT scan chest abdomen pelvis nonacute, MRI with questionable occipital lobe changes.   Subjective:   Patient in bed appears to be in no distress denies any chest abdominal pain, no shortness of breath, does feel little constipated.   Assessment  & Plan :   1.  Acute toxic encephalopathy gradually progressive at SNF - MRI with ?  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  2. Sepsis in the setting of MRSA bacteremia - ID consulted, on Vancomycin >> Dapto, TTE stable, per ID no need for TEE, repeat cultures stable thus far.  Per ID total of 4 weeks of IV antibiotics, tunneled PICC line requested by IR & placed on 11/25/2020 as she has pulled multiple IVs.  3.  Bipolar disorder.  Currently on combination of Depakote and Haldol, per Psych Seroquel held.  Psych has seen the patient, request continued follow-up.  According to the brother she had stopped taking her psych medications 2 months ago, her main issue this admission seems to be largely psych disorder per neurology, will defer management to psych request Psych to follow daily.  4. H/O Sacral Osteomyelitis with healed Scaral Decub Ulcer - continue nursing wound care, stable ESR CRP and procalcitonin as well.   5. N&V with stool ball - supportive Rx, bowel regimen.  Soapsuds enema on 11/27/2020.  6.  Paroxysmal A. fib.  Mali vas 2 score of greater than 3.  Currently on Eliquis, not on any rate controlling agents.  7.  Dyslipidemia.  On statin.  8.  Chronic diastolic CHF EF 62%.  Currently compensated.  9.  Obesity.  BMI of 31.  Supportive care follow-up with PCP for weight loss.  10.  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 Vanco stopped 11/20/20. Rash ++ better.  11. ? MRI changes DW Neuro Dr Rory Percy, likely PRESS - BP now in good control.  12.  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  13.  Prolonged QTC.  Due to combination of her psych meds and electrolyte imbalance, with addition of Coreg QTC has normalized 2 days in a row on 11/26/2020 and 11/28/2018, no further EKG.  14.  Urinary retention.  Multiple and I &O's done, now Foley and Flomax from  11/24/20.  15. DM type II.  For now sliding scale.  Lab Results  Component Value Date   HGBA1C 6.1 (H) 11/18/2020   CBG (last 3)  Recent Labs    11/26/20 1808 11/27/20 0017 11/27/20 0535  GLUCAP 188* 147* 146*        Condition - Fair  Family Communication  : Joni Fears [phone] over the phone on 11/20/2020 at 9:36 AM, 11/21/20, discussed clearly patient's refusal to take medications, has been refusing taking her psych medications for several months.  If significant decline he will consider comfort measures.  For now we are trying her best to optimize medication intake and improve her mentation, 11/24/20  Code Status :  Full  Consults  :  Neuro, Psych, ID, IR for PICC line  PUD Prophylaxis : PPI   Procedures  :     Right IJ based by IR on 11/25/2020.    Foley catheter placed 11/25/2020   MRI - Ill-defined patchy hyperintensity in the left occipital parietal lobe without restricted diffusion or abnormal enhancement. Differential diagnosis includes late subacute infarct, encephalitis.   LP - non acute  CT - 1. Main pulmonary artery measures at the upper limits of normal. Correlate with pulmonary hypertension. 2. Otherwise no acute intrathoracic abnormality. 3. Cholelithiasis with no CT findings of acute cholecystitis. 4. An 8 cm stool ball within the rectum with no associated findings suggest of stercoral colitis. 5. Grossly stable in size uterine fibroids  TTE - 1. Limited study due to poor acoustic windows. Left ventricular ejection fraction, by estimation, is 60 to 65%. The left ventricle has normal function. The left ventricle has no regional wall motion abnormalities. There is mild concentric left ventricular hypertrophy. Left ventricular diastolic parameters are consistent with Grade I diastolic dysfunction (impaired relaxation).  2. Right ventricular systolic function is  mildly reduced. The right ventricular size is mildly-to-moderately enlarged.  3. The mitral valve is  normal in structure. Trivial mitral valve regurgitation. No evidence of mitral stenosis.  4. The aortic valve is tricuspid. There is mild calcification of the aortic valve. There is mild thickening of the aortic valve. Aortic valve regurgitation is not visualized. Mild aortic valve sclerosis is present, with no evidence of aortic valve stenosis.  5. No valvular vegetations visualized on surface echo      Disposition Plan  :    Status is: Inpatient  Remains inpatient appropriate because:IV treatments appropriate due to intensity of illness or inability to take PO   Dispo: The patient is from: SNF              Anticipated d/c is to: SNF              Patient currently is not medically stable to d/c.   Difficult to place patient No   DVT Prophylaxis  :  Eliquis  Lab Results  Component Value Date   PLT 212 11/27/2020    Diet :  Diet Order            Diet regular Room service appropriate? Yes; Fluid consistency: Thin  Diet effective now                  Inpatient Medications  Scheduled Meds: . amLODipine  10 mg Oral Daily  . apixaban  5 mg Oral BID  . atorvastatin  10 mg Oral Daily  . carvedilol  6.25 mg Oral BID WC  . divalproex  500 mg Oral Q12H  . docusate sodium  200 mg Oral BID  . insulin aspart  0-15 Units Subcutaneous Q6H  . multivitamin with minerals  1 tablet Oral Daily  . pantoprazole  40 mg Oral Daily  . polyethylene glycol  17 g Oral BID  . risperiDONE  2 mg Oral BID  . tamsulosin  0.4 mg Oral Daily   Continuous Infusions: . DAPTOmycin (CUBICIN)  IV 700 mg (11/26/20 2112)   PRN Meds:.acetaminophen **OR** acetaminophen, hydrALAZINE, lidocaine (PF), LORazepam, [DISCONTINUED] ondansetron **OR** ondansetron (ZOFRAN) IV  Antibiotics  :    Anti-infectives (From admission, onward)   Start     Dose/Rate Route Frequency Ordered Stop   11/20/20 2000  DAPTOmycin (CUBICIN) 700 mg in sodium chloride 0.9 % IVPB        8 mg/kg  88 kg 128 mL/hr over 30 Minutes  Intravenous Daily 11/20/20 1657 12/16/20 2359   11/18/20 2200  vancomycin (VANCOREADY) IVPB 1250 mg/250 mL  Status:  Discontinued        1,250 mg 166.7 mL/hr over 90 Minutes Intravenous Every 24 hours 11/17/20 2345 11/20/20 1644   11/18/20 0000  ampicillin (OMNIPEN) 2 g in sodium chloride 0.9 % 100 mL IVPB  Status:  Discontinued        2 g 300 mL/hr over 20 Minutes Intravenous Every 4 hours 11/17/20 2319 11/18/20 1520   11/17/20 2345  vancomycin (VANCOCIN) IVPB 1000 mg/200 mL premix        1,000 mg 200 mL/hr over 60 Minutes Intravenous  Once 11/17/20 2331 11/18/20 0155   11/17/20 2330  cefTRIAXone (ROCEPHIN) 2 g in sodium chloride 0.9 % 100 mL IVPB  Status:  Discontinued        2 g 200 mL/hr over 30 Minutes Intravenous Every 12 hours 11/17/20 2319 11/18/20 1520       Time Spent in minutes  30  Lala Lund M.D on 11/27/2020 at 9:18 AM  To page go to [email or web address]   [facility] -  Office  [phone]    See all Orders from today for further details    Objective:   Vitals:   11/26/20 2041 11/27/20 0019 11/27/20 0343 11/27/20 0801  BP: (!) 113/58 127/64 (!) 150/62 124/64  Pulse: 70 72 68 71  Resp: _0 Temp: (!) 97.3 F (36.3 C) 97.7 F (36.5 C) 98.2 F (36.8 C) 98.1 F (36.7 C)  TempSrc: Oral Oral Oral Oral  SpO2: 96% 96% 96% 98%  Weight:        Wt Readings from Last 3 Encounters:  11/20/20 88 kg  10/12/20 88.5 kg  05/06/20 92.5 kg     Intake/Output Summary (Last 24 hours) at 11/27/2020 0918 Last data filed at 11/27/2020 0550 Gross per 24 hour  Intake 420 ml  Output 1700 ml  Net -1280 ml     Physical Exam  Awake but confused and less agitated, no focal deficits, diffuse macular rash on arms and trunk much improved, Foley catheter in place, right IJ PICC line in place [place].AT,PERRAL Supple Neck,No JVD, No cervical lymphadenopathy appriciated.  Symmetrical Chest wall movement, Good air movement bilaterally, CTAB RRR,No Gallops, Rubs or new  Murmurs, No Parasternal Heave +ve B.Sounds, Abd Soft, No tenderness, No organomegaly appriciated, No rebound - guarding or rigidity. No Cyanosis, Clubbing or edema, No new Rash or bruise      Data Review:    CBC Recent Labs  Lab 11/23/20 0528 11/24/20 0238 11/25/20 0218 11/26/20 0216 11/27/20 0103  WBC 9.7 7.9 9.1 8.1 11.5*  HGB 12.0 11.0* 11.1* 11.6* 11.1*  HCT 37.2 35.2* 35.4* 37.0 35.7*  PLT 237 226 226 218 212  MCV 91.0 92.4 90.8 91.8 93.0  MCH 29.3 28.9 28.5 28.8 28.9  MCHC 32.3 31.3 31.4 31.4 31.1  RDW 15.1 15.2 15.1 14.7 14.5  LYMPHSABS 3.3 3.1 3.8 3.2 4.4*  MONOABS 1.2* 1.0 1.0 0.7 1.1*  EOSABS 0.5 0.5 0.5 0.5 0.4  BASOSABS 0.0 0.0 0.0 0.0 0.0    Recent Labs  Lab 11/23/20 0528 11/24/20 0238 11/25/20 0218 11/26/20 0216 11/27/20 0103  NA 143 142 142 140 140  K 4.1 4.1 4.3 4.3 4.9  CL 108 104 105 105 102  CO2 29 32 32 32 33*  GLUCOSE 109* 103* 113* 150* 153*  BUN _1 CREATININE 0.58 0.72 0.74 0.76 0.81  CALCIUM 8.9 8.7* 8.6* 8.2* 8.5*  AST 12* 11* 10* 13* 9*  ALT _2 ALKPHOS 38 41 38 46 49  BILITOT 0.8 0.8 0.4 0.6 0.3  ALBUMIN 2.6* 2.4* 2.3* 2.3* 2.3*  MG 1.9 2.0 2.0 2.1 2.0  CRP 1.6* 1.6* 1.0* 1.5* 1.0*  PROCALCITON <0.10 <0.10 <0.10 <0.10 <0.10  BNP 15.6 21.4 28.2 41.7 23.0    ------------------------------------------------------------------------------------------------------------------ No results for input(s): CHOL, HDL, LDLCALC, TRIG, CHOLHDL, LDLDIRECT in the last 72 hours.  Lab Results  Component Value Date   HGBA1C 6.1 (H) 11/18/2020   ------------------------------------------------------------------------------------------------------------------ No results for input(s): TSH, T4TOTAL, T3FREE, THYROIDAB in the last 72 hours.  Invalid input(s): FREET3  Cardiac Enzymes No results for input(s): CKMB, TROPONINI, MYOGLOBIN in the last 168 hours.  Invalid input(s):  CK ------------------------------------------------------------------------------------------------------------------    Component Value Date/Time   BNP 23.0 11/27/2020 0103     Radiology Reports CT ANGIO HEAD W OR WO CONTRAST  Result Date: 11/21/2020 CLINICAL DATA:  Initial evaluation for neuro deficit, stroke suspected. EXAM: CT ANGIOGRAPHY HEAD AND NECK TECHNIQUE: Multidetector CT imaging of the head and neck was performed using the standard protocol during bolus administration of intravenous contrast. Multiplanar CT image reconstructions and MIPs were obtained to evaluate the vascular anatomy. Carotid stenosis measurements (when applicable) are obtained utilizing NASCET criteria, using the distal internal carotid diameter as the denominator. CONTRAST:  75m OMNIPAQUE IOHEXOL 350 MG/ML SOLN COMPARISON:  Prior MRI from 11/19/2020. FINDINGS: CT HEAD FINDINGS Brain: Age-related cerebral atrophy with mild chronic small vessel ischemic disease. Recently identified signal changes involving the left parieto-occipital region not visible by CT. No acute intracranial hemorrhage. No acute large vessel territory infarct. No mass lesion or midline shift. No hydrocephalus or extra-axial fluid collection. Vascular: No hyperdense vessel. Scattered vascular calcifications noted within the carotid siphons. Skull: Scalp soft tissues and calvarium within normal limits. Sinuses: Scattered mucosal thickening noted within the ethmoidal air cells. Small air-fluid level noted within the right sphenoid sinus. Paranasal sinuses are otherwise clear. No mastoid effusion. Orbits: Globes and orbital soft tissues within normal limits. Review of the MIP images confirms the above findings CTA NECK FINDINGS Aortic arch: Visualized aortic arch within normal limits for caliber with normal 3 vessel morphology. No hemodynamically significant stenosis about the origin the great vessels. Right carotid system: Right common and internal carotid  arteries widely patent without stenosis, dissection or occlusion. Left carotid system: Left common and internal carotid arteries widely patent without stenosis, dissection or occlusion. Vertebral arteries: Both vertebral arteries arise from subclavian arteries. No proximal subclavian artery stenosis. Moderate narrowing of the left V2 segment at the level of C3 related to an extrinsic compression from adjacent uncovertebral disease. Vertebral arteries otherwise widely patent without stenosis, dissection or occlusion. Skeleton: No acute osseous finding. No discrete or worrisome osseous lesions. Hyperostosis frontalis interna noted. Other neck: No other acute soft tissue abnormality within the neck. No mass or adenopathy. Upper chest: Which lies upper chest demonstrates no acute finding. Review of the MIP images confirms the above findings CTA HEAD FINDINGS Anterior circulation: Both internal carotid arteries widely patent to the termini without stenosis. A1 segments widely patent. Normal anterior communicating artery complex. Both anterior cerebral arteries widely patent to their distal aspects without stenosis. No M1 stenosis or occlusion. Normal MCA bifurcations. Distal MCA branches well perfused and symmetric. Posterior circulation: Both V4 segments patent to the vertebrobasilar junction without stenosis. Neither PICA origin well visualized. Basilar widely patent to its distal aspect without stenosis. Superior cerebellar arteries patent bilaterally. Both PCAs primarily supplied via the basilar and are well perfused to there distal aspects. Venous sinuses: Patent allowing for timing contrast bolus. Anatomic variants: None significant.  No intracranial aneurysm. Review of the MIP images confirms the above findings IMPRESSION: CT HEAD IMPRESSION: 1. No acute intracranial abnormality. Recently identified signal changes at the left parieto-occipital region not visible by CT. 2. Age-related cerebral atrophy with mild  chronic small vessel ischemic disease. CTA HEAD AND NECK IMPRESSION: Negative CTA of the head and neck. No large vessel occlusion, hemodynamically significant stenosis, or other acute vascular abnormality. Electronically Signed   By: BJeannine BogaM.D.   On: 11/21/2020 23:22   DG Abd 1 View  Result Date: 11/27/2020 CLINICAL DATA:  Nausea EXAM: ABDOMEN - 1 VIEW COMPARISON:  None. FINDINGS: The bowel gas pattern is normal. Extensive bowel content is identified throughout colon. No radio-opaque calculi or other significant radiographic abnormality are seen. IMPRESSION: No bowel obstruction. Constipation. Electronically Signed  By: Abelardo Diesel M.D.   On: 11/27/2020 08:22   DG Abd 1 View  Result Date: 11/21/2020 CLINICAL DATA:  Nausea EXAM: ABDOMEN - 1 VIEW COMPARISON:  None. FINDINGS: There is diffuse stool throughout colon. No bowel dilatation or air-fluid level to suggest bowel obstruction. No free air. There are pelvic calcifications consistent with phleboliths. Visualized lung bases clear. IMPRESSION: Diffuse stool throughout colon. Question a degree of underlying constipation. No bowel obstruction or free air. Electronically Signed   By: Lowella Grip III M.D.   On: 11/21/2020 12:18   CT HEAD WO CONTRAST  Result Date: 11/17/2020 CLINICAL DATA:  Altered mental status. EXAM: CT HEAD WITHOUT CONTRAST TECHNIQUE: Contiguous axial images were obtained from the base of the skull through the vertex without intravenous contrast. COMPARISON:  May 05, 2020 FINDINGS: Brain: There is mild cerebral atrophy with widening of the extra-axial spaces and ventricular dilatation. There are areas of decreased attenuation within the white matter tracts of the supratentorial brain, consistent with microvascular disease changes. Vascular: No hyperdense vessel or unexpected calcification. Skull: Normal. Negative for fracture or focal lesion. Sinuses/Orbits: Mild bilateral ethmoid sinus mucosal thickening is  seen. Other: The study is limited secondary to patient motion. IMPRESSION: 1. Generalized cerebral atrophy. 2. No acute intracranial abnormality. Electronically Signed   By: Virgina Norfolk M.D.   On: 11/17/2020 17:17   CT ANGIO NECK W OR WO CONTRAST  Result Date: 11/21/2020 CLINICAL DATA:  Initial evaluation for neuro deficit, stroke suspected. EXAM: CT ANGIOGRAPHY HEAD AND NECK TECHNIQUE: Multidetector CT imaging of the head and neck was performed using the standard protocol during bolus administration of intravenous contrast. Multiplanar CT image reconstructions and MIPs were obtained to evaluate the vascular anatomy. Carotid stenosis measurements (when applicable) are obtained utilizing NASCET criteria, using the distal internal carotid diameter as the denominator. CONTRAST:  35m OMNIPAQUE IOHEXOL 350 MG/ML SOLN COMPARISON:  Prior MRI from 11/19/2020. FINDINGS: CT HEAD FINDINGS Brain: Age-related cerebral atrophy with mild chronic small vessel ischemic disease. Recently identified signal changes involving the left parieto-occipital region not visible by CT. No acute intracranial hemorrhage. No acute large vessel territory infarct. No mass lesion or midline shift. No hydrocephalus or extra-axial fluid collection. Vascular: No hyperdense vessel. Scattered vascular calcifications noted within the carotid siphons. Skull: Scalp soft tissues and calvarium within normal limits. Sinuses: Scattered mucosal thickening noted within the ethmoidal air cells. Small air-fluid level noted within the right sphenoid sinus. Paranasal sinuses are otherwise clear. No mastoid effusion. Orbits: Globes and orbital soft tissues within normal limits. Review of the MIP images confirms the above findings CTA NECK FINDINGS Aortic arch: Visualized aortic arch within normal limits for caliber with normal 3 vessel morphology. No hemodynamically significant stenosis about the origin the great vessels. Right carotid system: Right common  and internal carotid arteries widely patent without stenosis, dissection or occlusion. Left carotid system: Left common and internal carotid arteries widely patent without stenosis, dissection or occlusion. Vertebral arteries: Both vertebral arteries arise from subclavian arteries. No proximal subclavian artery stenosis. Moderate narrowing of the left V2 segment at the level of C3 related to an extrinsic compression from adjacent uncovertebral disease. Vertebral arteries otherwise widely patent without stenosis, dissection or occlusion. Skeleton: No acute osseous finding. No discrete or worrisome osseous lesions. Hyperostosis frontalis interna noted. Other neck: No other acute soft tissue abnormality within the neck. No mass or adenopathy. Upper chest: Which lies upper chest demonstrates no acute finding. Review of the MIP images confirms the above findings  CTA HEAD FINDINGS Anterior circulation: Both internal carotid arteries widely patent to the termini without stenosis. A1 segments widely patent. Normal anterior communicating artery complex. Both anterior cerebral arteries widely patent to their distal aspects without stenosis. No M1 stenosis or occlusion. Normal MCA bifurcations. Distal MCA branches well perfused and symmetric. Posterior circulation: Both V4 segments patent to the vertebrobasilar junction without stenosis. Neither PICA origin well visualized. Basilar widely patent to its distal aspect without stenosis. Superior cerebellar arteries patent bilaterally. Both PCAs primarily supplied via the basilar and are well perfused to there distal aspects. Venous sinuses: Patent allowing for timing contrast bolus. Anatomic variants: None significant.  No intracranial aneurysm. Review of the MIP images confirms the above findings IMPRESSION: CT HEAD IMPRESSION: 1. No acute intracranial abnormality. Recently identified signal changes at the left parieto-occipital region not visible by CT. 2. Age-related cerebral  atrophy with mild chronic small vessel ischemic disease. CTA HEAD AND NECK IMPRESSION: Negative CTA of the head and neck. No large vessel occlusion, hemodynamically significant stenosis, or other acute vascular abnormality. Electronically Signed   By: Jeannine Boga M.D.   On: 11/21/2020 23:22   CT Chest W Contrast  Result Date: 11/17/2020 CLINICAL DATA:  Fever, altered mental status.  Nausea and vomiting. EXAM: CT CHEST, ABDOMEN, AND PELVIS WITH CONTRAST TECHNIQUE: Multidetector CT imaging of the chest, abdomen and pelvis was performed following the standard protocol during bolus administration of intravenous contrast. CONTRAST:  140m OMNIPAQUE IOHEXOL 300 MG/ML  SOLN COMPARISON:  CT abdomen pelvis 12/20/2019, CT angiography chest 12/24/2019 FINDINGS: CT CHEST FINDINGS Cardiovascular: Normal heart size. No significant pericardial effusion. The thoracic aorta is normal in caliber. Mild atherosclerotic plaque of the thoracic aorta. The main pulmonary artery measures at the upper limits of normal: 3.2 cm. Mediastinum/Nodes: No enlarged mediastinal, hilar, or axillary lymph nodes. Thyroid gland, trachea, and esophagus demonstrate no significant findings. Lungs/Pleura: Right lower lobe atelectasis with persistent elevated right hemidiaphragm. Limited evaluation for pulmonary nodule due to respiratory motion artifact. No pulmonary mass. No focal consolidation. No pleural effusion. No pneumothorax. Musculoskeletal: No chest wall abnormality. No suspicious lytic or blastic osseous lesions. No acute displaced fracture. Multilevel degenerative changes of the spine. CT ABDOMEN PELVIS FINDINGS Hepatobiliary: No focal liver abnormality. Gallstone noted within the gallbladder lumen. No gallbladder wall thickening or pericholecystic fluid. No biliary dilatation. Pancreas: No focal lesion. Normal pancreatic contour. No surrounding inflammatory changes. No main pancreatic ductal dilatation. Spleen: Normal in size without  focal abnormality. Adrenals/Urinary Tract: No adrenal nodule bilaterally. Bilateral kidneys enhance symmetrically. Exophytic hypodense 2.2 cm lesion within the right kidney likely represent simple renal cysts. Slightly increased density likely due to streak artifact at this level. No hydronephrosis. No hydroureter. The urinary bladder is unremarkable. On delayed imaging, there is no urothelial wall thickening and there are no filling defects in the opacified portions of the bilateral collecting systems or ureters. Stomach/Bowel: Stomach is within normal limits. No evidence of bowel wall thickening or dilatation. 8 cm stool ball within the rectum. The appendix not definitely identified. Vascular/Lymphatic: No abdominal aorta or iliac aneurysm. Mild atherosclerotic plaque of the aorta and its branches. No abdominal, pelvic, or inguinal lymphadenopathy. Reproductive: Difficult to measure grossly stable uterine fibroids. No adnexal lesion. Other: No intraperitoneal free fluid. No intraperitoneal free gas. No organized fluid collection. Musculoskeletal: Tiny fat containing left inguinal hernia. No suspicious lytic or blastic osseous lesions. No acute displaced fracture. Multilevel degenerative changes of the spine. IMPRESSION: 1. Main pulmonary artery measures at the upper limits  of normal. Correlate with pulmonary hypertension. 2. Otherwise no acute intrathoracic abnormality. 3. Cholelithiasis with no CT findings of acute cholecystitis. 4. An 8 cm stool ball within the rectum with no associated findings suggest of stercoral colitis. 5. 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 Electronically Signed   By: Iven Finn M.D.   On: 11/17/2020 20:07   MR BRAIN W WO CONTRAST  Result Date: 11/19/2020 CLINICAL DATA:  Mental status change.  Encephalopathy EXAM: MRI HEAD WITHOUT AND WITH CONTRAST TECHNIQUE: Multiplanar, multiecho pulse sequences of the brain and surrounding structures were obtained without and with  intravenous contrast. CONTRAST:  7.2m GADAVIST GADOBUTROL 1 MMOL/ML IV SOLN COMPARISON:  CT head 11/17/2020.  MRI head 12/24/2019 FINDINGS: Brain: Patchy areas of hyperintensity in the left occipital pole extending into the left posterior parietal lobe best seen on FLAIR. This involves primarily the white matter but some areas of cortex. This area does not show restricted diffusion or susceptibility. This area was normal on the prior MRI 2021. No abnormal enhancement. Moderate atrophy. Mild white matter changes consistent with chronic microvascular ischemia. Diffusion-weighted imaging negative for acute infarct. No hemorrhage or mass lesion. Vascular: Normal arterial flow voids Skull and upper cervical spine: Negative Sinuses/Orbits: Mild mucosal edema paranasal sinuses. Negative orbit Other: None IMPRESSION: Ill-defined patchy hyperintensity in the left occipital parietal lobe without restricted diffusion or abnormal enhancement. Differential diagnosis includes late subacute infarct, encephalitis. Tumor not considered likely. Correlate with symptoms. Electronically Signed   By: CFranchot GalloM.D.   On: 11/19/2020 15:24   CT Abdomen Pelvis W Contrast  Result Date: 11/17/2020 CLINICAL DATA:  Fever, altered mental status.  Nausea and vomiting. EXAM: CT CHEST, ABDOMEN, AND PELVIS WITH CONTRAST TECHNIQUE: Multidetector CT imaging of the chest, abdomen and pelvis was performed following the standard protocol during bolus administration of intravenous contrast. CONTRAST:  1071mOMNIPAQUE IOHEXOL 300 MG/ML  SOLN COMPARISON:  CT abdomen pelvis 12/20/2019, CT angiography chest 12/24/2019 FINDINGS: CT CHEST FINDINGS Cardiovascular: Normal heart size. No significant pericardial effusion. The thoracic aorta is normal in caliber. Mild atherosclerotic plaque of the thoracic aorta. The main pulmonary artery measures at the upper limits of normal: 3.2 cm. Mediastinum/Nodes: No enlarged mediastinal, hilar, or axillary lymph  nodes. Thyroid gland, trachea, and esophagus demonstrate no significant findings. Lungs/Pleura: Right lower lobe atelectasis with persistent elevated right hemidiaphragm. Limited evaluation for pulmonary nodule due to respiratory motion artifact. No pulmonary mass. No focal consolidation. No pleural effusion. No pneumothorax. Musculoskeletal: No chest wall abnormality. No suspicious lytic or blastic osseous lesions. No acute displaced fracture. Multilevel degenerative changes of the spine. CT ABDOMEN PELVIS FINDINGS Hepatobiliary: No focal liver abnormality. Gallstone noted within the gallbladder lumen. No gallbladder wall thickening or pericholecystic fluid. No biliary dilatation. Pancreas: No focal lesion. Normal pancreatic contour. No surrounding inflammatory changes. No main pancreatic ductal dilatation. Spleen: Normal in size without focal abnormality. Adrenals/Urinary Tract: No adrenal nodule bilaterally. Bilateral kidneys enhance symmetrically. Exophytic hypodense 2.2 cm lesion within the right kidney likely represent simple renal cysts. Slightly increased density likely due to streak artifact at this level. No hydronephrosis. No hydroureter. The urinary bladder is unremarkable. On delayed imaging, there is no urothelial wall thickening and there are no filling defects in the opacified portions of the bilateral collecting systems or ureters. Stomach/Bowel: Stomach is within normal limits. No evidence of bowel wall thickening or dilatation. 8 cm stool ball within the rectum. The appendix not definitely identified. Vascular/Lymphatic: No abdominal aorta or iliac aneurysm. Mild atherosclerotic plaque of  the aorta and its branches. No abdominal, pelvic, or inguinal lymphadenopathy. Reproductive: Difficult to measure grossly stable uterine fibroids. No adnexal lesion. Other: No intraperitoneal free fluid. No intraperitoneal free gas. No organized fluid collection. Musculoskeletal: Tiny fat containing left inguinal  hernia. No suspicious lytic or blastic osseous lesions. No acute displaced fracture. Multilevel degenerative changes of the spine. IMPRESSION: 1. Main pulmonary artery measures at the upper limits of normal. Correlate with pulmonary hypertension. 2. Otherwise no acute intrathoracic abnormality. 3. Cholelithiasis with no CT findings of acute cholecystitis. 4. An 8 cm stool ball within the rectum with no associated findings suggest of stercoral colitis. 5. 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 Electronically Signed   By: Iven Finn M.D.   On: 11/17/2020 20:07   IR Fluoro Guide CV Line Right  Result Date: 11/25/2020 INDICATION: 75 year old female with dementia and infection requiring intravenous antibiotics. She has repeatedly pulled out every IV that has been placed. She requires durable venous access that is well secured. Therefore, she presents for placement of a tunneled central venous catheter. EXAM: IR ULTRASOUND GUIDANCE VASC ACCESS RIGHT; IR RIGHT FLUORO GUIDE CV LINE MEDICATIONS: None ANESTHESIA/SEDATION: None FLUOROSCOPY TIME:  Fluoroscopy Time: 0 minutes 12 seconds (5 mGy). COMPLICATIONS: None immediate. PROCEDURE: Informed written consent was obtained from the patient after a thorough discussion of the procedural risks, benefits and alternatives. All questions were addressed. Maximal Sterile Barrier Technique was utilized including caps, mask, sterile gowns, sterile gloves, sterile drape, hand hygiene and skin antiseptic. A timeout was performed prior to the initiation of the procedure. The right internal jugular vein was interrogated with ultrasound and found to be widely patent. An image was obtained and stored for the medical record. Local anesthesia was attained by infiltration with 1% lidocaine. A small dermatotomy was made. Under real-time sonographic guidance, the vessel was punctured with a 21 gauge micropuncture needle. Using standard technique, the initial micro needle was exchanged  over a 0.018 micro wire for a peel-away sheath. A suitable skin exit site inferior to the clavicle was selected. Local anesthesia was again attained by infiltration with 1% lidocaine. A small dermatotomy was made. The tunneled central venous catheter was then tunneled from the skin exit site to the dermatotomy overlying the venous access site. The catheter was cut to 22 cm (14 cm intravascular length) and advanced through the peel-away sheath. An image was obtained for the medical record documenting the tip of the catheter in the upper right atrium. The catheter was then capped and flushed. The catheter was secured to the skin at 3 locations using 0 Prolene suture. The dermatotomy over the venous access site was closed with Dermabond. Sterile bandages were placed. IMPRESSION: Successful placement of a power injectable dual lumen tunneled central venous catheter via the right internal jugular vein. The catheter tip is in the upper right atrium and the catheter is ready for immediate use. Electronically Signed   By: Jacqulynn Cadet M.D.   On: 11/25/2020 17:07   IR US Guide Vasc Access Right  Result Date: 11/25/2020 INDICATION: 75 year old female with dementia and infection requiring intravenous antibiotics. She has repeatedly pulled out every IV that has been placed. She requires durable venous access that is well secured. Therefore, she presents for placement of a tunneled central venous catheter. EXAM: IR ULTRASOUND GUIDANCE VASC ACCESS RIGHT; IR RIGHT FLUORO GUIDE CV LINE MEDICATIONS: None ANESTHESIA/SEDATION: None FLUOROSCOPY TIME:  Fluoroscopy Time: 0 minutes 12 seconds (5 mGy). COMPLICATIONS: None immediate. PROCEDURE: Informed written consent was obtained from  the patient after a thorough discussion of the procedural risks, benefits and alternatives. All questions were addressed. Maximal Sterile Barrier Technique was utilized including caps, mask, sterile gowns, sterile gloves, sterile drape, hand hygiene  and skin antiseptic. A timeout was performed prior to the initiation of the procedure. The right internal jugular vein was interrogated with ultrasound and found to be widely patent. An image was obtained and stored for the medical record. Local anesthesia was attained by infiltration with 1% lidocaine. A small dermatotomy was made. Under real-time sonographic guidance, the vessel was punctured with a 21 gauge micropuncture needle. Using standard technique, the initial micro needle was exchanged over a 0.018 micro wire for a peel-away sheath. A suitable skin exit site inferior to the clavicle was selected. Local anesthesia was again attained by infiltration with 1% lidocaine. A small dermatotomy was made. The tunneled central venous catheter was then tunneled from the skin exit site to the dermatotomy overlying the venous access site. The catheter was cut to 22 cm (14 cm intravascular length) and advanced through the peel-away sheath. An image was obtained for the medical record documenting the tip of the catheter in the upper right atrium. The catheter was then capped and flushed. The catheter was secured to the skin at 3 locations using 0 Prolene suture. The dermatotomy over the venous access site was closed with Dermabond. Sterile bandages were placed. IMPRESSION: Successful placement of a power injectable dual lumen tunneled central venous catheter via the right internal jugular vein. The catheter tip is in the upper right atrium and the catheter is ready for immediate use. Electronically Signed   By: Jacqulynn Cadet M.D.   On: 11/25/2020 17:07   DG Chest Port 1 View  Result Date: 11/27/2020 CLINICAL DATA:  Shortness of breath EXAM: PORTABLE CHEST 1 VIEW COMPARISON:  May 05, 2020 FINDINGS: The heart size and mediastinal contours are stable. Right central venous line is identified with distal tip in the superior vena cava. There is chronic elevation of right hemidiaphragm. Mild atelectasis of right  lung base is noted. The left lung is clear. The visualized skeletal structures are unremarkable. IMPRESSION: Mild atelectasis of right lung base. Electronically Signed   By: Abelardo Diesel M.D.   On: 11/27/2020 08:21   EEG adult  Result Date: 11/21/2020 Plancher, Baron Sane, MD     11/21/2020  3:50 PM PROCEDURE: EEG routine 22 minutes with video study  DATES OF TEST: 11/21/2020  REASON FOR TEST: acute encephalopathy  AED/Sedative MEDICATIONS: n/a  TECHNIQUE: This is an 18 channel digital EEG recording using the standard international 10/20 system of electrode placement with one channel EKG recording. Pt was awake during this recording.  FINDINGS: Background rhythm: symmetric diffuse 4-8 Hz, mostly myogenic artifact. Amplitude :  low Continuity: continuous Breach effect:  NO Variability: YES Reactivity: YES Rhythmic delta activity: NO Periodic discharges: NO Sporadic epileptiform discharges: NO Electrographic/electroclinical seizures: NO  Limited EKG reveals no abnormalities.   IMPRESSION: This is an abnormal study due to - 1. Moderate to mild Generalized slowing is a non-specific finding consistent with a generalized disturbance of cerebral functioning including toxic, metabolic, or structural abnormalities that are multi-focal or diffuse. 2. No definite epileptiform discharges or electrographic seizures noted.    ECHOCARDIOGRAM COMPLETE  Result Date: 11/19/2020    ECHOCARDIOGRAM REPORT   Patient Name:   ERRYN DICKISON Date of Exam: 11/19/2020 Medical Rec #:  [id number]      Height:       66.0 in Accession #:  [id number]     Weight:       195.2 lb Date of Birth:  November 18, 1945       BSA:          1.979 m Patient Age:    29 years       BP:           145/76 mmHg Patient Gender: F              HR:           75 bpm. Exam Location:  Inpatient Procedure: 2D Echo Indications:    bacteremia  History:        Patient has prior history of Echocardiogram examinations, most                 recent 12/23/2019. CHF, Arrythmias:Atrial  Fibrillation,                 Signs/Symptoms:Bacteremia; Risk Factors:Diabetes.  Sonographer:    Johny Chess Referring Phys: [id number] KZLDJ LATIF [facility]  Sonographer Comments: Image acquisition challenging due to uncooperative patient and Image acquisition challenging due to patient body habitus. IMPRESSIONS  1. Limited study due to poor acoustic windows. Left ventricular ejection fraction, by estimation, is 60 to 65%. The left ventricle has normal function. The left ventricle has no regional wall motion abnormalities. There is mild concentric left ventricular hypertrophy. Left ventricular diastolic parameters are consistent with Grade I diastolic dysfunction (impaired relaxation).  2. Right ventricular systolic function is mildly reduced. The right ventricular size is mildly-to-moderately enlarged.  3. The mitral valve is normal in structure. Trivial mitral valve regurgitation. No evidence of mitral stenosis.  4. The aortic valve is tricuspid. There is mild calcification of the aortic valve. There is mild thickening of the aortic valve. Aortic valve regurgitation is not visualized. Mild aortic valve sclerosis is present, with no evidence of aortic valve stenosis.  5. No valvular vegetations visualized on surface echo. Pending clinical suspicion for infective endocarditis, can consider TEE for further evaluation. Comparison(s): Compared to prior TTE on 57/01/77, the RV systolic function appears mildly improved. Otherwise there is no significant change. Conclusion(s)/Recommendation(s): No evidence of valvular vegetations on this transthoracic echocardiogram. Would recommend a transesophageal echocardiogram to exclude infective endocarditis if clinically indicated. FINDINGS  Left Ventricle: Limited study due to poor acoustic windows. Left ventricular ejection fraction, by estimation, is 60 to 65%. The left ventricle has normal function. The left ventricle has no regional wall motion abnormalities. Definity  contrast agent was given IV to delineate the left ventricular endocardial borders. The left ventricular internal cavity size was normal in size. There is mild concentric left ventricular hypertrophy. Left ventricular diastolic parameters are consistent with Grade I diastolic dysfunction (impaired relaxation). Right Ventricle: The right ventricular size is mildly-to-moderately enlarged. Right vetricular wall thickness was not well visualized. Right ventricular systolic function is mildly reduced. Left Atrium: Left atrial size was normal in size. Right Atrium: Right atrial size was normal in size. Pericardium: There is no evidence of pericardial effusion. Mitral Valve: The mitral valve is normal in structure. There is mild thickening of the mitral valve leaflet(s). There is mild calcification of the mitral valve leaflet(s). Trivial mitral valve regurgitation. No evidence of mitral valve stenosis. Tricuspid Valve: The tricuspid valve is normal in structure. Tricuspid valve regurgitation is trivial. Aortic Valve: The aortic valve is tricuspid. There is mild calcification of the aortic valve. There is mild thickening of the aortic valve. Aortic valve regurgitation is not visualized. Mild aortic  valve sclerosis is present, with no evidence of aortic valve stenosis. Pulmonic Valve: The pulmonic valve was not well visualized. Pulmonic valve regurgitation is not visualized. Aorta: The aortic root and ascending aorta are structurally normal, with no evidence of dilitation. IAS/Shunts: No atrial level shunt detected by color flow Doppler.  LEFT VENTRICLE PLAX 2D LVIDd:         4.26 cm LVIDs:         2.71 cm LV PW:         1.17 cm LV IVS:        1.06 cm LVOT diam:     1.80 cm LVOT Area:     2.54 cm  RIGHT VENTRICLE RV S prime:     9.90 cm/s LEFT ATRIUM         Index LA diam:    3.80 cm 1.92 cm/m   AORTA Ao Root diam: 2.90 cm Ao Asc diam:  3.40 cm  SHUNTS Systemic Diam: 1.80 cm Gwyndolyn Kaufman MD Electronically signed by  Gwyndolyn Kaufman MD Signature Date/Time: 11/19/2020/5:00:20 PM    Final    [place] EKG SITE RITE  Result Date: 11/23/2020 If Site Rite image not attached, placement could not be confirmed due to current cardiac rhythm.  DG FL GUIDED LUMBAR PUNCTURE  Result Date: 11/18/2020 CLINICAL DATA:  Encephalopathy.  Fever.  Rule out meningitis EXAM: DIAGNOSTIC LUMBAR PUNCTURE UNDER FLUOROSCOPIC GUIDANCE COMPARISON:  CT abdomen pelvis 11/17/2020 FLUOROSCOPY TIME:  Fluoroscopy Time:  0 minutes 48 seconds Radiation Exposure Index (if provided by the fluoroscopic device): Number of Acquired Spot Images: 1 PROCEDURE: Patient was sedated prior to the study. The patient was uncooperative. Informed consent was obtained from the patient's brother by telephone prior to the procedure, including potential complications of headache, allergy, and pain. With the patient prone, the lower back was prepped with Betadine. 1% Lidocaine was used for local anesthesia. Lumbar puncture was performed at the L2-3 level using a 20 gauge needle with return of clear CSF with an opening pressure of 13 cm water. Six ml of CSF were obtained for laboratory studies. The patient tolerated the procedure well and there were no apparent complications. IMPRESSION: Successful lumbar puncture.  Clear CSF. Electronically Signed   By: Franchot Gallo M.D.   On: 11/18/2020 14:20

## 2020-11-27 NOTE — Consult Note (Signed)
[facility] Face-to-Face Psychiatry Consult   Reason for Consult: ''Severe bipolar disorder with psychosis'' Referring Physician:  Dr Thedore Mins Patient Identification: Becky Hendricks MRN:  [id number] Principal Diagnosis: Bipolar affective disorder, currently manic, mild (HCC) Diagnosis:  Principal Problem:   Bipolar affective disorder, currently manic, mild (HCC) Active Problems:   Acute metabolic encephalopathy   Severe sepsis (HCC)   AF (paroxysmal atrial fibrillation) (HCC)   Sacral wound   Chronic diastolic CHF (congestive heart failure) (HCC)   Bipolar disorder (HCC)   SIRS (systemic inflammatory response syndrome) (HCC)   Nausea and vomiting   Mixed diabetic hyperlipidemia associated with type 2 diabetes mellitus (HCC)   Type 2 diabetes mellitus with hyperglycemia, with long-term current use of insulin (HCC)   Constipation   Encephalopathy   MRSA bacteremia  Total Time spent with patient: 30 minutes   11/27/2020 Subjective: '' I am doing fine, thanks for coming back to see me.''  Objective: Patient seen face to face in her room today, chart reviewed and content therein noted. Patient is alert, awake, cooperative and pleasant. She continues to show favorable response to current psychiatric medication regimen as evidenced by decreased agitation, aggression and improved compliant with medication intake. Today, she denies psychosis, delusions and self harming thoughts.    Past Psychiatric History: bipolar  Risk to Self:  none Risk to Others:  none Prior Inpatient Therapy:  unknown Prior Outpatient Therapy:  unknown  Past Medical History:  Past Medical History:  Diagnosis Date  . Atrial fibrillation (HCC)   . Bipolar disorder (HCC)   . Bipolar disorder (HCC) 04/27/2020  . Clostridium difficile diarrhea 08/02/2019  . Dehydration   . Essential hypertension   . Fall 02/11/2020  . Morbid obesity (HCC)   . Osteomyelitis (HCC) 12/03/2019  . Pneumonia due to COVID-19 virus 09/15/2019  .  Type 2 diabetes mellitus (HCC)   . Weakness     Past Surgical History:  Procedure Laterality Date  . INCISION AND DRAINAGE PERIRECTAL ABSCESS Left 12/04/2019   Procedure: IRRIGATION AND DEBRIDEMENT LEFT BUTTOCK ABSCESS;  Surgeon: Violeta Gelinas, MD;  Location: [facility] OR;  Service: General;  Laterality: Left;  . INCISION AND DRAINAGE PERIRECTAL ABSCESS Left 12/10/2019   Procedure: REPEAT IRRIGATION AND DEBRIDEMENT BUTTOCK  ABSCESS;  Surgeon: Abigail Miyamoto, MD;  Location: [facility] OR;  Service: General;  Laterality: Left;  . IR FLUORO GUIDE CV LINE RIGHT  11/25/2020  . IR US GUIDE VASC ACCESS RIGHT  11/25/2020   Family History:  Family History  Family history unknown: Yes   Family Psychiatric  History: unknown Social History:  Social History   Substance and Sexual Activity  Alcohol Use Not Currently     Social History   Substance and Sexual Activity  Drug Use Not Currently    Social History   Socioeconomic History  . Marital status: Single    Spouse name: Not on file  . Number of children: Not on file  . Years of education: Not on file  . Highest education level: Not on file  Occupational History  . Not on file  Tobacco Use  . Smoking status: Never Smoker  . Smokeless tobacco: Never Used  Vaping Use  . Vaping Use: Unknown  Substance and Sexual Activity  . Alcohol use: Not Currently  . Drug use: Not Currently  . Sexual activity: Not Currently    Birth control/protection: Post-menopausal  Other Topics Concern  . Not on file  Social History Narrative  . Not on file   Social Determinants  of Health   Financial Resource Strain: Not on file  Food Insecurity: Not on file  Transportation Needs: Not on file  Physical Activity: Not on file  Stress: Not on file  Social Connections: Not on file   Additional Social History:    Allergies:   Allergies  Allergen Reactions  . Chlorhexidine Gluconate Itching  . Acetazolamide Er Rash    Labs:  Results for orders placed or  performed during the hospital encounter of 11/17/20 (from the past 48 hour(s))  Glucose, capillary     Status: Abnormal   Collection Time: 11/25/20  1:06 PM  Result Value Ref Range   Glucose-Capillary 104 (H) 70 - 99 mg/dL    Comment: Glucose reference range applies only to samples taken after fasting for at least 8 hours.  Glucose, capillary     Status: None   Collection Time: 11/25/20  4:53 PM  Result Value Ref Range   Glucose-Capillary 89 70 - 99 mg/dL    Comment: Glucose reference range applies only to samples taken after fasting for at least 8 hours.  Glucose, capillary     Status: Abnormal   Collection Time: 11/26/20 12:10 AM  Result Value Ref Range   Glucose-Capillary 152 (H) 70 - 99 mg/dL    Comment: Glucose reference range applies only to samples taken after fasting for at least 8 hours.  Procalcitonin     Status: None   Collection Time: 11/26/20  2:16 AM  Result Value Ref Range   Procalcitonin <0.10 ng/mL    Comment:        Interpretation: PCT (Procalcitonin) <= 0.5 ng/mL: Systemic infection (sepsis) is not likely. Local bacterial infection is possible. (NOTE)       Sepsis PCT Algorithm           Lower Respiratory Tract                                      Infection PCT Algorithm    ----------------------------     ----------------------------         PCT < 0.25 ng/mL                PCT < 0.10 ng/mL          Strongly encourage             Strongly discourage   discontinuation of antibiotics    initiation of antibiotics    ----------------------------     -----------------------------       PCT 0.25 - 0.50 ng/mL            PCT 0.10 - 0.25 ng/mL               OR       >80% decrease in PCT            Discourage initiation of                                            antibiotics      Encourage discontinuation           of antibiotics    ----------------------------     -----------------------------         PCT >= 0.50 ng/mL  PCT 0.26 - 0.50 ng/mL                AND        <80% decrease in PCT             Encourage initiation of                                             antibiotics       Encourage continuation           of antibiotics    ----------------------------     -----------------------------        PCT >= 0.50 ng/mL                  PCT > 0.50 ng/mL               AND         increase in PCT                  Strongly encourage                                      initiation of antibiotics    Strongly encourage escalation           of antibiotics                                     -----------------------------                                           PCT <= 0.25 ng/mL                                                 OR                                        > 80% decrease in PCT                                      Discontinue / Do not initiate                                             antibiotics  Performed at [facility], [address] [place], [place] [address]   Magnesium     Status: None   Collection Time: 11/26/20  2:16 AM  Result Value Ref Range   Magnesium 2.1 1.7 - 2.4 mg/dL    Comment: Performed at [name], [address] [place], [place] [address]  Comprehensive metabolic panel     Status: Abnormal   Collection Time: 11/26/20  2:16 AM  Result  Value Ref Range   Sodium 140 135 - 145 mmol/L   Potassium 4.3 3.5 - 5.1 mmol/L   Chloride 105 98 - 111 mmol/L   CO2 32 22 - 32 mmol/L   Glucose, Bld 150 (H) 70 - 99 mg/dL    Comment: Glucose reference range applies only to samples taken after fasting for at least 8 hours.   BUN 9 8 - 23 mg/dL   Creatinine, Ser 1.61 0.44 - 1.00 mg/dL   Calcium 8.2 (L) 8.9 - 10.3 mg/dL   Total Protein 4.7 (L) 6.5 - 8.1 g/dL   Albumin 2.3 (L) 3.5 - 5.0 g/dL   AST 13 (L) 15 - 41 U/L   ALT 14 0 - 44 U/L   Alkaline Phosphatase 46 38 - 126 U/L   Total Bilirubin 0.6 0.3 - 1.2 mg/dL   GFR, Estimated >09 >60 mL/min    Comment: (NOTE) Calculated using the CKD-EPI  Creatinine Equation (2021)    Anion gap 3 (L) 5 - 15    Comment: Performed at [facility], [address], [place], [place] [address]  CBC with Differential/Platelet     Status: Abnormal   Collection Time: 11/26/20  2:16 AM  Result Value Ref Range   WBC 8.1 4.0 - 10.5 K/uL   RBC 4.03 3.87 - 5.11 MIL/uL   Hemoglobin 11.6 (L) 12.0 - 15.0 g/dL   HCT 81.1 91.4 - 78.2 %   MCV 91.8 80.0 - 100.0 fL   MCH 28.8 26.0 - 34.0 pg   MCHC 31.4 30.0 - 36.0 g/dL   RDW 95.6 21.3 - 08.6 %   Platelets 218 150 - 400 K/uL   nRBC 0.0 0.0 - 0.2 %   Neutrophils Relative % 46 %   Neutro Abs 3.7 1.7 - 7.7 K/uL   Lymphocytes Relative 39 %   Lymphs Abs 3.2 0.7 - 4.0 K/uL   Monocytes Relative 9 %   Monocytes Absolute 0.7 0.1 - 1.0 K/uL   Eosinophils Relative 6 %   Eosinophils Absolute 0.5 0.0 - 0.5 K/uL   Basophils Relative 0 %   Basophils Absolute 0.0 0.0 - 0.1 K/uL   Immature Granulocytes 0 %   Abs Immature Granulocytes 0.03 0.00 - 0.07 K/uL    Comment: Performed at [facility], [address] [place], [place] [address]  C-reactive protein     Status: Abnormal   Collection Time: 11/26/20  2:16 AM  Result Value Ref Range   CRP 1.5 (H) <1.0 mg/dL    Comment: Performed at [facility], [address] [place], [place] [address]  Brain natriuretic peptide     Status: None   Collection Time: 11/26/20  2:16 AM  Result Value Ref Range   B Natriuretic Peptide 41.7 0.0 - 100.0 pg/mL    Comment: Performed at [facility], [address], [place], [place] [address]  Glucose, capillary     Status: Abnormal   Collection Time: 11/26/20  6:18 AM  Result Value Ref Range   Glucose-Capillary 116 (H) 70 - 99 mg/dL    Comment: Glucose reference range applies only to samples taken after fasting for at least 8 hours.   Comment 1 Notify RN    Comment 2 Document in Chart   Glucose, capillary     Status: Abnormal   Collection Time: 11/26/20 11:15 AM  Result Value Ref Range   Glucose-Capillary  209 (H) 70 - 99 mg/dL    Comment:  Glucose reference range applies only to samples taken after fasting for at least 8 hours.  Glucose, capillary     Status: Abnormal   Collection Time: 11/26/20  6:08 PM  Result Value Ref Range   Glucose-Capillary 188 (H) 70 - 99 mg/dL    Comment: Glucose reference range applies only to samples taken after fasting for at least 8 hours.  Glucose, capillary     Status: Abnormal   Collection Time: 11/27/20 12:17 AM  Result Value Ref Range   Glucose-Capillary 147 (H) 70 - 99 mg/dL    Comment: Glucose reference range applies only to samples taken after fasting for at least 8 hours.  Procalcitonin     Status: None   Collection Time: 11/27/20  1:03 AM  Result Value Ref Range   Procalcitonin <0.10 ng/mL    Comment:        Interpretation: PCT (Procalcitonin) <= 0.5 ng/mL: Systemic infection (sepsis) is not likely. Local bacterial infection is possible. (NOTE)       Sepsis PCT Algorithm           Lower Respiratory Tract                                      Infection PCT Algorithm    ----------------------------     ----------------------------         PCT < 0.25 ng/mL                PCT < 0.10 ng/mL          Strongly encourage             Strongly discourage   discontinuation of antibiotics    initiation of antibiotics    ----------------------------     -----------------------------       PCT 0.25 - 0.50 ng/mL            PCT 0.10 - 0.25 ng/mL               OR       >80% decrease in PCT            Discourage initiation of                                            antibiotics      Encourage discontinuation           of antibiotics    ----------------------------     -----------------------------         PCT >= 0.50 ng/mL              PCT 0.26 - 0.50 ng/mL               AND        <80% decrease in PCT             Encourage initiation of                                             antibiotics       Encourage continuation           of antibiotics     ----------------------------     -----------------------------  PCT >= 0.50 ng/mL                  PCT > 0.50 ng/mL               AND         increase in PCT                  Strongly encourage                                      initiation of antibiotics    Strongly encourage escalation           of antibiotics                                     -----------------------------                                           PCT <= 0.25 ng/mL                                                 OR                                        > 80% decrease in PCT                                      Discontinue / Do not initiate                                             antibiotics  Performed at [facility], [address], [place], [place] [address]   Magnesium     Status: None   Collection Time: 11/27/20  1:03 AM  Result Value Ref Range   Magnesium 2.0 1.7 - 2.4 mg/dL    Comment: Performed at [facility], [address], [place], [place] [address]  Comprehensive metabolic panel     Status: Abnormal   Collection Time: 11/27/20  1:03 AM  Result Value Ref Range   Sodium 140 135 - 145 mmol/L   Potassium 4.9 3.5 - 5.1 mmol/L   Chloride 102 98 - 111 mmol/L   CO2 33 (H) 22 - 32 mmol/L   Glucose, Bld 153 (H) 70 - 99 mg/dL    Comment: Glucose reference range applies only to samples taken after fasting for at least 8 hours.   BUN 15 8 - 23 mg/dL   Creatinine, Ser 0.98 0.44 - 1.00 mg/dL   Calcium 8.5 (L) 8.9 - 10.3 mg/dL   Total Protein 4.8 (L) 6.5 - 8.1 g/dL   Albumin 2.3 (L) 3.5 - 5.0 g/dL   AST 9 (L) 15 - 41 U/L   ALT 12 0 - 44 U/L  Alkaline Phosphatase 49 38 - 126 U/L   Total Bilirubin 0.3 0.3 - 1.2 mg/dL   GFR, Estimated >93 >79 mL/min    Comment: (NOTE) Calculated using the CKD-EPI Creatinine Equation (2021)    Anion gap 5 5 - 15    Comment: Performed at [facility], [address] [place], [place] [address]  CBC with Differential/Platelet     Status:  Abnormal   Collection Time: 11/27/20  1:03 AM  Result Value Ref Range   WBC 11.5 (H) 4.0 - 10.5 K/uL   RBC 3.84 (L) 3.87 - 5.11 MIL/uL   Hemoglobin 11.1 (L) 12.0 - 15.0 g/dL   HCT 73.5 (L) 32.9 - 92.4 %   MCV 93.0 80.0 - 100.0 fL   MCH 28.9 26.0 - 34.0 pg   MCHC 31.1 30.0 - 36.0 g/dL   RDW 26.8 34.1 - 96.2 %   Platelets 212 150 - 400 K/uL   nRBC 0.0 0.0 - 0.2 %   Neutrophils Relative % 48 %   Neutro Abs 5.5 1.7 - 7.7 K/uL   Lymphocytes Relative 38 %   Lymphs Abs 4.4 (H) 0.7 - 4.0 K/uL   Monocytes Relative 10 %   Monocytes Absolute 1.1 (H) 0.1 - 1.0 K/uL   Eosinophils Relative 3 %   Eosinophils Absolute 0.4 0.0 - 0.5 K/uL   Basophils Relative 0 %   Basophils Absolute 0.0 0.0 - 0.1 K/uL   Immature Granulocytes 1 %   Abs Immature Granulocytes 0.07 0.00 - 0.07 K/uL    Comment: Performed at [facility], [address], [place], [place] [address]  C-reactive protein     Status: Abnormal   Collection Time: 11/27/20  1:03 AM  Result Value Ref Range   CRP 1.0 (H) <1.0 mg/dL    Comment: Performed at [facility], [address] [place], [place] [address]  Brain natriuretic peptide     Status: None   Collection Time: 11/27/20  1:03 AM  Result Value Ref Range   B Natriuretic Peptide 23.0 0.0 - 100.0 pg/mL    Comment: Performed at [facility], [address] [place], [place] [address]  Glucose, capillary     Status: Abnormal   Collection Time: 11/27/20  5:35 AM  Result Value Ref Range   Glucose-Capillary 146 (H) 70 - 99 mg/dL    Comment: Glucose reference range applies only to samples taken after fasting for at least 8 hours.  Glucose, capillary     Status: Abnormal   Collection Time: 11/27/20 11:43 AM  Result Value Ref Range   Glucose-Capillary 222 (H) 70 - 99 mg/dL    Comment: Glucose reference range applies only to samples taken after fasting for at least 8 hours.    Current Facility-Administered Medications  Medication Dose Route Frequency Provider Last  Rate Last Admin  . acetaminophen (TYLENOL) tablet 650 mg  650 mg Oral Q6H PRN Shalhoub, Deno Lunger, MD       Or  . acetaminophen (TYLENOL) suppository 650 mg  650 mg Rectal Q6H PRN Shalhoub, Deno Lunger, MD      . amLODipine (NORVASC) tablet 10 mg  10 mg Oral Daily Leroy Sea, MD   10 mg at 11/27/20 0933  . apixaban (ELIQUIS) tablet 5 mg  5 mg Oral BID Marguerita Merles [place], DO   5 mg at 11/27/20 0814  . atorvastatin (LIPITOR) tablet 10 mg  10 mg Oral Daily Leroy Sea, MD  10 mg at 11/27/20 0934  . carvedilol (COREG) tablet 6.25 mg  6.25 mg Oral BID WC Leroy SeaSingh, Prashant K, MD   6.25 mg at 11/27/20 0934  . DAPTOmycin (CUBICIN) 700 mg in sodium chloride 0.9 % IVPB  8 mg/kg Intravenous Q2000 Leroy SeaSingh, Prashant K, MD 128 mL/hr at 11/26/20 2112 700 mg at 11/26/20 2112  . divalproex (DEPAKOTE) DR tablet 500 mg  500 mg Oral Q12H Leroy SeaSingh, Prashant K, MD   500 mg at 11/27/20 0533  . docusate sodium (COLACE) capsule 200 mg  200 mg Oral BID Susa RaringSingh, Prashant K, MD   200 mg at 11/27/20 0932  . hydrALAZINE (APRESOLINE) injection 10 mg  10 mg Intravenous Q6H PRN Leroy SeaSingh, Prashant K, MD   10 mg at 11/21/20 1220  . insulin aspart (novoLOG) injection 0-15 Units  0-15 Units Subcutaneous Q6H Shalhoub, Deno LungerGeorge J, MD   5 Units at 11/27/20 1212  . lidocaine (PF) (XYLOCAINE) 1 % injection    PRN Sterling BigMcCullough, Heath K, MD   5 mL at 11/25/20 1619  . LORazepam (ATIVAN) injection 1 mg  1 mg Intravenous Q6H PRN Maryagnes AmosStarkes-Perry, Takia S, FNP   1 mg at 11/25/20 1405  . multivitamin with minerals tablet 1 tablet  1 tablet Oral Daily Leroy SeaSingh, Prashant K, MD   1 tablet at 11/27/20 0932  . ondansetron (ZOFRAN) injection 4 mg  4 mg Intravenous Q6H PRN Shalhoub, Deno LungerGeorge J, MD      . pantoprazole (PROTONIX) EC tablet 40 mg  40 mg Oral Daily Leroy SeaSingh, Prashant K, MD   40 mg at 11/27/20 0931  . polyethylene glycol (MIRALAX / GLYCOLAX) packet 17 g  17 g Oral BID Leroy SeaSingh, Prashant K, MD   17 g at 11/27/20 0930  . risperiDONE (RISPERDAL M-TABS)  disintegrating tablet 2 mg  2 mg Oral BID Maryagnes AmosStarkes-Perry, Takia S, FNP   2 mg at 11/27/20 0930  . tamsulosin (FLOMAX) capsule 0.4 mg  0.4 mg Oral Daily Leroy SeaSingh, Prashant K, MD   0.4 mg at 11/27/20 0932    Musculoskeletal: Strength & Muscle Tone: within normal limits Gait & Station: did not witness Patient leans: N/A  Psychiatric Specialty Exam: Physical Exam Vitals and nursing note reviewed.  Constitutional:      Appearance: Normal appearance.  HENT:     Head: Normocephalic.     Nose: Nose normal.  Pulmonary:     Effort: Pulmonary effort is normal.  Neurological:     General: No focal deficit present.     Mental Status: She is alert.  Psychiatric:        Attention and Perception: Perception normal.        Mood and Affect: Mood and affect normal.        Speech: Speech normal.        Behavior: Behavior is cooperative.        Thought Content: Thought content normal.        Cognition and Memory: Memory normal.        Judgment: Judgment is impulsive.     Review of Systems  Psychiatric/Behavioral: Negative.   All other systems reviewed and are negative.   Blood pressure 111/62, pulse 68, temperature 97.8 F (36.6 C), temperature source Oral, resp. rate 20, weight 88 kg, SpO2 98 %.Body mass index is 31.31 kg/m.  General Appearance: Casual  Eye Contact:  Minimal  Speech:  Clear and Coherent  Volume:  Normal  Mood:  Euthymic  Affect:  Congruent  Thought Process:  Coherent and Linear  Orientation:  Full (Time, Place, and Person)  Thought Content:  Logical  Suicidal Thoughts:  No  Homicidal Thoughts:  No  Memory:  Immediate;   Fair Recent;   Fair  Judgement:  Other:  marginal  Insight:  Shallow  Psychomotor Activity:  Normal  Concentration:  Concentration: Fair and Attention Span: Fair  Recall:  [employer] of Knowledge:  Fair  Language:  Fair  Akathisia:  No  Handed:  Right  AIMS (if indicated):     Assets:  Desire for Improvement Housing Social Support  ADL's:   Impaired  Cognition:  Impaired,  Mild  Sleep:      Physical Exam: Physical Exam Vitals and nursing note reviewed.  Constitutional:      Appearance: Normal appearance.  HENT:     Head: Normocephalic.     Nose: Nose normal.  Pulmonary:     Effort: Pulmonary effort is normal.  Neurological:     General: No focal deficit present.     Mental Status: She is alert.  Psychiatric:        Attention and Perception: Perception normal.        Mood and Affect: Mood and affect normal.        Speech: Speech normal.        Behavior: Behavior is cooperative.        Thought Content: Thought content normal.        Cognition and Memory: Memory normal.        Judgment: Judgment is impulsive.    Review of Systems  Psychiatric/Behavioral: Negative.   All other systems reviewed and are negative.  Blood pressure 111/62, pulse 68, temperature 97.8 F (36.6 C), temperature source Oral, resp. rate 20, weight 88 kg, SpO2 98 %. Body mass index is 31.31 kg/m.  Treatment Plan Summary:  75 year old female with history of Bipolar disorder who stopped taking her medications few months prior to this hospital admission. She was admitted with AMS and aggressive behavior. Today, patient seems cooperative, denies psychosis, delusions, self harming thoughts and showing favorable response to current psychiatric medications.  Recommendations: -Continue 1:1 sitter for safety -Continue Depakote 500 mg BID for aggression/agitation -Continue Risperdal 2 mg twice daily for psychosis - Depakote level in 3 days (11/30/20)     -Continue serial daily EKGs monitoring, patient remains at risk for prolonged QTc. (457>510).     Disposition: No evidence of imminent risk to self or others at present.   Supportive therapy provided about ongoing stressors.  Thedore Mins, MD 11/27/2020 12:38 PM

## 2020-11-28 DIAGNOSIS — F3111 Bipolar disorder, current episode manic without psychotic features, mild: Secondary | ICD-10-CM | POA: Diagnosis not present

## 2020-11-28 LAB — COMPREHENSIVE METABOLIC PANEL
ALT: 11 U/L (ref 0–44)
AST: 11 U/L — ABNORMAL LOW (ref 15–41)
Albumin: 2.4 g/dL — ABNORMAL LOW (ref 3.5–5.0)
Alkaline Phosphatase: 46 U/L (ref 38–126)
Anion gap: 7 (ref 5–15)
BUN: 13 mg/dL (ref 8–23)
CO2: 34 mmol/L — ABNORMAL HIGH (ref 22–32)
Calcium: 8.9 mg/dL (ref 8.9–10.3)
Chloride: 100 mmol/L (ref 98–111)
Creatinine, Ser: 0.62 mg/dL (ref 0.44–1.00)
GFR, Estimated: >60 mL/min (ref >60)
Glucose, Bld: 131 mg/dL — ABNORMAL HIGH (ref 70–99)
Potassium: 4.6 mmol/L (ref 3.5–5.1)
Sodium: 141 mmol/L (ref 135–145)
Total Bilirubin: 0.4 mg/dL (ref 0.3–1.2)
Total Protein: 4.9 g/dL — ABNORMAL LOW (ref 6.5–8.1)

## 2020-11-28 LAB — BRAIN NATRIURETIC PEPTIDE: B Natriuretic Peptide: 38.6 pg/mL (ref 0.0–100.0)

## 2020-11-28 LAB — CBC WITH DIFFERENTIAL/PLATELET
Abs Immature Granulocytes: 0.13 10*3/uL — ABNORMAL HIGH (ref 0.00–0.07)
Basophils Absolute: 0 10*3/uL (ref 0.0–0.1)
Basophils Relative: 0 %
Eosinophils Absolute: 0.5 10*3/uL (ref 0.0–0.5)
Eosinophils Relative: 5 %
HCT: 36.4 % (ref 36.0–46.0)
Hemoglobin: 11.5 g/dL — ABNORMAL LOW (ref 12.0–15.0)
Immature Granulocytes: 1 %
Lymphocytes Relative: 43 %
Lymphs Abs: 4.3 10*3/uL — ABNORMAL HIGH (ref 0.7–4.0)
MCH: 28.9 pg (ref 26.0–34.0)
MCHC: 31.6 g/dL (ref 30.0–36.0)
MCV: 91.5 fL (ref 80.0–100.0)
Monocytes Absolute: 1 10*3/uL (ref 0.1–1.0)
Monocytes Relative: 10 %
Neutro Abs: 4.1 10*3/uL (ref 1.7–7.7)
Neutrophils Relative %: 41 %
Platelets: 208 10*3/uL (ref 150–400)
RBC: 3.98 MIL/uL (ref 3.87–5.11)
RDW: 14.3 % (ref 11.5–15.5)
WBC: 10 10*3/uL (ref 4.0–10.5)
nRBC: 0 % (ref 0.0–0.2)

## 2020-11-28 LAB — GLUCOSE, CAPILLARY
Glucose-Capillary: 104 mg/dL — ABNORMAL HIGH (ref 70–99)
Glucose-Capillary: 152 mg/dL — ABNORMAL HIGH (ref 70–99)
Glucose-Capillary: 188 mg/dL — ABNORMAL HIGH (ref 70–99)
Glucose-Capillary: 190 mg/dL — ABNORMAL HIGH (ref 70–99)
Glucose-Capillary: 226 mg/dL — ABNORMAL HIGH (ref 70–99)

## 2020-11-28 LAB — CK: Total CK: 52 U/L (ref 38–234)

## 2020-11-28 LAB — MAGNESIUM: Magnesium: 1.9 mg/dL (ref 1.7–2.4)

## 2020-11-28 LAB — PROCALCITONIN: Procalcitonin: <0.1 ng/mL

## 2020-11-28 LAB — C-REACTIVE PROTEIN: CRP: 0.7 mg/dL (ref <1.0)

## 2020-11-28 MED ORDER — SODIUM CHLORIDE 0.9 % IV SOLN
INTRAVENOUS | Status: DC | PRN
  Administered 2020-11-28 – 2020-12-01 (×3): 250 mL via INTRAVENOUS

## 2020-11-28 NOTE — Progress Notes (Signed)
PROGRESS NOTE                                                                                                                                                                                                             Patient Demographics:    Becky Hendricks, is a 75 y.o. female, DOB - 07/28/1946, AST:[id number]  Outpatient Primary MD for the patient is [name], MD    LOS - 10  Admit date - 11/17/2020    Chief Complaint  Patient presents with  . ivc  . 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  So far LP, CT scan chest abdomen pelvis nonacute, MRI with questionable occipital lobe changes.   Subjective:   Patient in bed appears to be in no distress denies any chest abdominal pain, no shortness of breath, does feel little constipated.   Assessment  & Plan :   1.  Acute toxic encephalopathy gradually progressive at SNF - MRI with ?  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  2. Sepsis in the setting of MRSA bacteremia - ID consulted, on Vancomycin >> Dapto, TTE stable, per ID no need for TEE, repeat cultures stable thus far.  Per ID total of 4 weeks of IV daptomycin with stop date of 12/17/2020, tunneled PICC line placed on 11/25/2020 as she has pulled multiple IVs.  3.  Bipolar disorder.  Currently on combination of Depakote and risperidone, agitation much improved on 11/29/2018 , psych following.  4. H/O Sacral Osteomyelitis with healed Scaral Decub Ulcer - continue nursing wound care, stable ESR CRP and procalcitonin as well.   5. N&V with stool ball - supportive Rx, bowel regimen.  Soapsuds enema on 11/27/2020.  6.  Paroxysmal A. fib.  Mali vas 2 score of greater than 3.  Currently on Eliquis, not on any rate controlling agents.  7.  Dyslipidemia.  On statin.  8.  Chronic diastolic CHF EF 83%.  Currently compensated.  9.  Obesity.  BMI of 31.  Supportive care follow-up with PCP for weight loss.  10.  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 Vanco stopped 11/20/20. Rash ++ better.  11. ? MRI changes DW Neuro Dr Rory Percy, likely PRESS - BP now in good control.  12.  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  13.  Prolonged QTC.  Due to combination of her psych meds and electrolyte imbalance, with addition of Coreg QTC has normalized 2 days in a row on 11/26/2020 and 11/28/2018, no further EKG.  14.  Urinary retention.  Multiple and I &O's done, now Foley and Flomax from 11/24/20.  15. DM type II.  For now sliding scale.  Lab Results  Component Value Date   HGBA1C 6.1 (H) 11/18/2020   CBG (last 3)  Recent Labs    11/27/20 1830 11/28/20 0011 11/28/20 0551  GLUCAP 154* 190* 104*         Condition - Fair  Family Communication  : Brother [name] [phone] over the phone on 11/20/2020 at 9:36 AM, 11/21/20, discussed clearly patient's refusal to take medications, has been refusing taking her psych medications for several months.  If significant decline he will consider comfort measures.  For now we are trying her best to optimize medication intake and improve her mentation, 11/24/20  Code Status :  Full  Consults  :  Neuro, Psych, ID, IR for PICC line  PUD Prophylaxis : PPI   Procedures  :     Right IJ based by IR on 11/25/2020.    Foley catheter placed 11/25/2020   MRI - Ill-defined patchy hyperintensity in the left occipital parietal lobe without restricted diffusion or abnormal enhancement. Differential diagnosis includes late subacute infarct, encephalitis.   LP - non acute  CT - 1. Main pulmonary artery measures at the upper limits of normal. Correlate with pulmonary hypertension. 2. Otherwise no acute intrathoracic abnormality. 3. Cholelithiasis with no CT findings of acute cholecystitis. 4. An 8 cm stool ball within the rectum with no associated findings suggest of stercoral colitis. 5. Grossly stable in size uterine fibroids  TTE - 1. Limited study due to poor acoustic windows. Left ventricular ejection fraction, by estimation, is 60 to 65%. The left ventricle has normal function. The left ventricle has no regional wall motion abnormalities. There is mild concentric left ventricular hypertrophy. Left ventricular diastolic parameters are consistent with Grade I diastolic dysfunction (impaired relaxation).  2. Right ventricular systolic function is mildly reduced. The right ventricular size is mildly-to-moderately enlarged.  3. The mitral valve is normal in structure. Trivial mitral valve regurgitation. No evidence of mitral stenosis.  4. The aortic valve is tricuspid. There is mild calcification of the aortic valve.  There is mild thickening of the aortic valve. Aortic  valve regurgitation is not visualized. Mild aortic valve sclerosis is present, with no evidence of aortic valve stenosis.  5. No valvular vegetations visualized on surface echo      Disposition Plan  :    Status is: Inpatient  Remains inpatient appropriate because:IV treatments appropriate due to intensity of illness or inability to take PO   Dispo: The patient is from: SNF              Anticipated d/c is to: SNF              Patient currently is not medically stable to d/c.   Difficult to place patient No   DVT Prophylaxis  :  Eliquis  Lab Results  Component Value Date   PLT 208 11/28/2020    Diet :  Diet Order            Diet regular Room service appropriate? Yes; Fluid consistency: Thin  Diet effective now                  Inpatient Medications  Scheduled Meds: . amLODipine  10 mg Oral Daily  . apixaban  5 mg Oral BID  . atorvastatin  10 mg Oral Daily  . carvedilol  6.25 mg Oral BID WC  . divalproex  500 mg Oral Q12H  . docusate sodium  200 mg Oral BID  . insulin aspart  0-15 Units Subcutaneous Q6H  . multivitamin with minerals  1 tablet Oral Daily  . pantoprazole  40 mg Oral Daily  . polyethylene glycol  17 g Oral BID  . risperiDONE  2 mg Oral BID  . tamsulosin  0.4 mg Oral Daily   Continuous Infusions: . DAPTOmycin (CUBICIN)  IV 700 mg (11/27/20 2338)   PRN Meds:.acetaminophen **OR** acetaminophen, hydrALAZINE, lidocaine (PF), LORazepam, [DISCONTINUED] ondansetron **OR** ondansetron (ZOFRAN) IV  Antibiotics  :    Anti-infectives (From admission, onward)   Start     Dose/Rate Route Frequency Ordered Stop   11/20/20 2000  DAPTOmycin (CUBICIN) 700 mg in sodium chloride 0.9 % IVPB        8 mg/kg  88 kg 128 mL/hr over 30 Minutes Intravenous Daily 11/20/20 1657 12/16/20 2359   11/18/20 2200  vancomycin (VANCOREADY) IVPB 1250 mg/250 mL  Status:  Discontinued        1,250 mg 166.7 mL/hr over 90 Minutes Intravenous Every 24 hours 11/17/20 2345 11/20/20  1644   11/18/20 0000  ampicillin (OMNIPEN) 2 g in sodium chloride 0.9 % 100 mL IVPB  Status:  Discontinued        2 g 300 mL/hr over 20 Minutes Intravenous Every 4 hours 11/17/20 2319 11/18/20 1520   11/17/20 2345  vancomycin (VANCOCIN) IVPB 1000 mg/200 mL premix        1,000 mg 200 mL/hr over 60 Minutes Intravenous  Once 11/17/20 2331 11/18/20 0155   11/17/20 2330  cefTRIAXone (ROCEPHIN) 2 g in sodium chloride 0.9 % 100 mL IVPB  Status:  Discontinued        2 g 200 mL/hr over 30 Minutes Intravenous Every 12 hours 11/17/20 2319 11/18/20 1520       Time Spent in minutes  30   Lala Lund M.D on 11/28/2020 at 10:23 AM  To page go to [email or web address]   [facility] -  Office  [phone]    See all Orders from today for further details    Objective:   Vitals:  11/27/20 2111 11/28/20 0014 11/28/20 0428 11/28/20 0835  BP: 118/60 130/61 (!) 157/68 (!) 118/57  Pulse: 82 77 63 85  Resp: _0 Temp: 98 F (36.7 C) 97.8 F (36.6 C) 97.8 F (36.6 C) 98 F (36.7 C)  TempSrc: Oral Oral Oral Oral  SpO2: 96% 97% 98% 94%  Weight:        Wt Readings from Last 3 Encounters:  11/20/20 88 kg  10/12/20 88.5 kg  05/06/20 92.5 kg     Intake/Output Summary (Last 24 hours) at 11/28/2020 1023 Last data filed at 11/28/2020 0900 Gross per 24 hour  Intake 1040 ml  Output 2451 ml  Net -1411 ml     Physical Exam  Awake more alert and minimally agitated, no focal deficits, diffuse macular rash on arms and trunk much improved, Foley catheter in place, right IJ PICC line in place [place].AT,PERRAL Supple Neck,No JVD, No cervical lymphadenopathy appriciated.  Symmetrical Chest wall movement, Good air movement bilaterally, CTAB RRR,No Gallops, Rubs or new Murmurs, No Parasternal Heave +ve B.Sounds, Abd Soft, No tenderness, No organomegaly appriciated, No rebound - guarding or rigidity. No Cyanosis, Clubbing or edema, No new Rash or bruise     Data Review:    CBC Recent  Labs  Lab 11/24/20 0238 11/25/20 0218 11/26/20 0216 11/27/20 0103 11/28/20 0500  WBC 7.9 9.1 8.1 11.5* 10.0  HGB 11.0* 11.1* 11.6* 11.1* 11.5*  HCT 35.2* 35.4* 37.0 35.7* 36.4  PLT 226 226 218 212 208  MCV 92.4 90.8 91.8 93.0 91.5  MCH 28.9 28.5 28.8 28.9 28.9  MCHC 31.3 31.4 31.4 31.1 31.6  RDW 15.2 15.1 14.7 14.5 14.3  LYMPHSABS 3.1 3.8 3.2 4.4* 4.3*  MONOABS 1.0 1.0 0.7 1.1* 1.0  EOSABS 0.5 0.5 0.5 0.4 0.5  BASOSABS 0.0 0.0 0.0 0.0 0.0    Recent Labs  Lab 11/23/20 0528 11/24/20 0238 11/25/20 0218 11/26/20 0216 11/27/20 0103 11/28/20 0500  NA 143 142 142 140 140 141  K 4.1 4.1 4.3 4.3 4.9 4.6  CL 108 104 105 105 102 100  CO2 29 32 32 32 33* 34*  GLUCOSE 109* 103* 113* 150* 153* 131*  BUN _1 CREATININE 0.58 0.72 0.74 0.76 0.81 0.62  CALCIUM 8.9 8.7* 8.6* 8.2* 8.5* 8.9  AST 12* 11* 10* 13* 9* 11*  ALT _2 ALKPHOS 38 41 38 46 49 46  BILITOT 0.8 0.8 0.4 0.6 0.3 0.4  ALBUMIN 2.6* 2.4* 2.3* 2.3* 2.3* 2.4*  MG 1.9 2.0 2.0 2.1 2.0 1.9  CRP 1.6* 1.6* 1.0* 1.5* 1.0* 0.7  PROCALCITON <0.10 <0.10 <0.10 <0.10 <0.10  --   BNP 15.6 21.4 28.2 41.7 23.0 38.6    ------------------------------------------------------------------------------------------------------------------ No results for input(s): CHOL, HDL, LDLCALC, TRIG, CHOLHDL, LDLDIRECT in the last 72 hours.  Lab Results  Component Value Date   HGBA1C 6.1 (H) 11/18/2020   ------------------------------------------------------------------------------------------------------------------ No results for input(s): TSH, T4TOTAL, T3FREE, THYROIDAB in the last 72 hours.  Invalid input(s): FREET3  Cardiac Enzymes No results for input(s): CKMB, TROPONINI, MYOGLOBIN in the last 168 hours.  Invalid input(s): CK ------------------------------------------------------------------------------------------------------------------    Component Value Date/Time   BNP 38.6 11/28/2020 0500      Radiology Reports CT ANGIO HEAD W OR WO CONTRAST  Result Date: 11/21/2020 CLINICAL DATA:  Initial evaluation for neuro deficit, stroke suspected. EXAM: CT ANGIOGRAPHY HEAD AND NECK TECHNIQUE: Multidetector CT imaging of the head and neck was  performed using the standard protocol during bolus administration of intravenous contrast. Multiplanar CT image reconstructions and MIPs were obtained to evaluate the vascular anatomy. Carotid stenosis measurements (when applicable) are obtained utilizing NASCET criteria, using the distal internal carotid diameter as the denominator. CONTRAST:  48m OMNIPAQUE IOHEXOL 350 MG/ML SOLN COMPARISON:  Prior MRI from 11/19/2020. FINDINGS: CT HEAD FINDINGS Brain: Age-related cerebral atrophy with mild chronic small vessel ischemic disease. Recently identified signal changes involving the left parieto-occipital region not visible by CT. No acute intracranial hemorrhage. No acute large vessel territory infarct. No mass lesion or midline shift. No hydrocephalus or extra-axial fluid collection. Vascular: No hyperdense vessel. Scattered vascular calcifications noted within the carotid siphons. Skull: Scalp soft tissues and calvarium within normal limits. Sinuses: Scattered mucosal thickening noted within the ethmoidal air cells. Small air-fluid level noted within the right sphenoid sinus. Paranasal sinuses are otherwise clear. No mastoid effusion. Orbits: Globes and orbital soft tissues within normal limits. Review of the MIP images confirms the above findings CTA NECK FINDINGS Aortic arch: Visualized aortic arch within normal limits for caliber with normal 3 vessel morphology. No hemodynamically significant stenosis about the origin the great vessels. Right carotid system: Right common and internal carotid arteries widely patent without stenosis, dissection or occlusion. Left carotid system: Left common and internal carotid arteries widely patent without stenosis, dissection or  occlusion. Vertebral arteries: Both vertebral arteries arise from subclavian arteries. No proximal subclavian artery stenosis. Moderate narrowing of the left V2 segment at the level of C3 related to an extrinsic compression from adjacent uncovertebral disease. Vertebral arteries otherwise widely patent without stenosis, dissection or occlusion. Skeleton: No acute osseous finding. No discrete or worrisome osseous lesions. Hyperostosis frontalis interna noted. Other neck: No other acute soft tissue abnormality within the neck. No mass or adenopathy. Upper chest: Which lies upper chest demonstrates no acute finding. Review of the MIP images confirms the above findings CTA HEAD FINDINGS Anterior circulation: Both internal carotid arteries widely patent to the termini without stenosis. A1 segments widely patent. Normal anterior communicating artery complex. Both anterior cerebral arteries widely patent to their distal aspects without stenosis. No M1 stenosis or occlusion. Normal MCA bifurcations. Distal MCA branches well perfused and symmetric. Posterior circulation: Both V4 segments patent to the vertebrobasilar junction without stenosis. Neither PICA origin well visualized. Basilar widely patent to its distal aspect without stenosis. Superior cerebellar arteries patent bilaterally. Both PCAs primarily supplied via the basilar and are well perfused to there distal aspects. Venous sinuses: Patent allowing for timing contrast bolus. Anatomic variants: None significant.  No intracranial aneurysm. Review of the MIP images confirms the above findings IMPRESSION: CT HEAD IMPRESSION: 1. No acute intracranial abnormality. Recently identified signal changes at the left parieto-occipital region not visible by CT. 2. Age-related cerebral atrophy with mild chronic small vessel ischemic disease. CTA HEAD AND NECK IMPRESSION: Negative CTA of the head and neck. No large vessel occlusion, hemodynamically significant stenosis, or other  acute vascular abnormality. Electronically Signed   By: BJeannine BogaM.D.   On: 11/21/2020 23:22   DG Abd 1 View  Result Date: 11/27/2020 CLINICAL DATA:  Nausea EXAM: ABDOMEN - 1 VIEW COMPARISON:  None. FINDINGS: The bowel gas pattern is normal. Extensive bowel content is identified throughout colon. No radio-opaque calculi or other significant radiographic abnormality are seen. IMPRESSION: No bowel obstruction. Constipation. Electronically Signed   By: WAbelardo DieselM.D.   On: 11/27/2020 08:22   DG Abd 1 View  Result Date: 11/21/2020 CLINICAL DATA:  Nausea EXAM: ABDOMEN - 1 VIEW COMPARISON:  None. FINDINGS: There is diffuse stool throughout colon. No bowel dilatation or air-fluid level to suggest bowel obstruction. No free air. There are pelvic calcifications consistent with phleboliths. Visualized lung bases clear. IMPRESSION: Diffuse stool throughout colon. Question a degree of underlying constipation. No bowel obstruction or free air. Electronically Signed   By: Lowella Grip III M.D.   On: 11/21/2020 12:18   CT HEAD WO CONTRAST  Result Date: 11/17/2020 CLINICAL DATA:  Altered mental status. EXAM: CT HEAD WITHOUT CONTRAST TECHNIQUE: Contiguous axial images were obtained from the base of the skull through the vertex without intravenous contrast. COMPARISON:  May 05, 2020 FINDINGS: Brain: There is mild cerebral atrophy with widening of the extra-axial spaces and ventricular dilatation. There are areas of decreased attenuation within the white matter tracts of the supratentorial brain, consistent with microvascular disease changes. Vascular: No hyperdense vessel or unexpected calcification. Skull: Normal. Negative for fracture or focal lesion. Sinuses/Orbits: Mild bilateral ethmoid sinus mucosal thickening is seen. Other: The study is limited secondary to patient motion. IMPRESSION: 1. Generalized cerebral atrophy. 2. No acute intracranial abnormality. Electronically Signed   By:  Virgina Norfolk M.D.   On: 11/17/2020 17:17   CT ANGIO NECK W OR WO CONTRAST  Result Date: 11/21/2020 CLINICAL DATA:  Initial evaluation for neuro deficit, stroke suspected. EXAM: CT ANGIOGRAPHY HEAD AND NECK TECHNIQUE: Multidetector CT imaging of the head and neck was performed using the standard protocol during bolus administration of intravenous contrast. Multiplanar CT image reconstructions and MIPs were obtained to evaluate the vascular anatomy. Carotid stenosis measurements (when applicable) are obtained utilizing NASCET criteria, using the distal internal carotid diameter as the denominator. CONTRAST:  41m OMNIPAQUE IOHEXOL 350 MG/ML SOLN COMPARISON:  Prior MRI from 11/19/2020. FINDINGS: CT HEAD FINDINGS Brain: Age-related cerebral atrophy with mild chronic small vessel ischemic disease. Recently identified signal changes involving the left parieto-occipital region not visible by CT. No acute intracranial hemorrhage. No acute large vessel territory infarct. No mass lesion or midline shift. No hydrocephalus or extra-axial fluid collection. Vascular: No hyperdense vessel. Scattered vascular calcifications noted within the carotid siphons. Skull: Scalp soft tissues and calvarium within normal limits. Sinuses: Scattered mucosal thickening noted within the ethmoidal air cells. Small air-fluid level noted within the right sphenoid sinus. Paranasal sinuses are otherwise clear. No mastoid effusion. Orbits: Globes and orbital soft tissues within normal limits. Review of the MIP images confirms the above findings CTA NECK FINDINGS Aortic arch: Visualized aortic arch within normal limits for caliber with normal 3 vessel morphology. No hemodynamically significant stenosis about the origin the great vessels. Right carotid system: Right common and internal carotid arteries widely patent without stenosis, dissection or occlusion. Left carotid system: Left common and internal carotid arteries widely patent without  stenosis, dissection or occlusion. Vertebral arteries: Both vertebral arteries arise from subclavian arteries. No proximal subclavian artery stenosis. Moderate narrowing of the left V2 segment at the level of C3 related to an extrinsic compression from adjacent uncovertebral disease. Vertebral arteries otherwise widely patent without stenosis, dissection or occlusion. Skeleton: No acute osseous finding. No discrete or worrisome osseous lesions. Hyperostosis frontalis interna noted. Other neck: No other acute soft tissue abnormality within the neck. No mass or adenopathy. Upper chest: Which lies upper chest demonstrates no acute finding. Review of the MIP images confirms the above findings CTA HEAD FINDINGS Anterior circulation: Both internal carotid arteries widely patent to the termini without stenosis. A1 segments widely patent. Normal anterior communicating  artery complex. Both anterior cerebral arteries widely patent to their distal aspects without stenosis. No M1 stenosis or occlusion. Normal MCA bifurcations. Distal MCA branches well perfused and symmetric. Posterior circulation: Both V4 segments patent to the vertebrobasilar junction without stenosis. Neither PICA origin well visualized. Basilar widely patent to its distal aspect without stenosis. Superior cerebellar arteries patent bilaterally. Both PCAs primarily supplied via the basilar and are well perfused to there distal aspects. Venous sinuses: Patent allowing for timing contrast bolus. Anatomic variants: None significant.  No intracranial aneurysm. Review of the MIP images confirms the above findings IMPRESSION: CT HEAD IMPRESSION: 1. No acute intracranial abnormality. Recently identified signal changes at the left parieto-occipital region not visible by CT. 2. Age-related cerebral atrophy with mild chronic small vessel ischemic disease. CTA HEAD AND NECK IMPRESSION: Negative CTA of the head and neck. No large vessel occlusion, hemodynamically  significant stenosis, or other acute vascular abnormality. Electronically Signed   By: Jeannine Boga M.D.   On: 11/21/2020 23:22   CT Chest W Contrast  Result Date: 11/17/2020 CLINICAL DATA:  Fever, altered mental status.  Nausea and vomiting. EXAM: CT CHEST, ABDOMEN, AND PELVIS WITH CONTRAST TECHNIQUE: Multidetector CT imaging of the chest, abdomen and pelvis was performed following the standard protocol during bolus administration of intravenous contrast. CONTRAST:  169m OMNIPAQUE IOHEXOL 300 MG/ML  SOLN COMPARISON:  CT abdomen pelvis 12/20/2019, CT angiography chest 12/24/2019 FINDINGS: CT CHEST FINDINGS Cardiovascular: Normal heart size. No significant pericardial effusion. The thoracic aorta is normal in caliber. Mild atherosclerotic plaque of the thoracic aorta. The main pulmonary artery measures at the upper limits of normal: 3.2 cm. Mediastinum/Nodes: No enlarged mediastinal, hilar, or axillary lymph nodes. Thyroid gland, trachea, and esophagus demonstrate no significant findings. Lungs/Pleura: Right lower lobe atelectasis with persistent elevated right hemidiaphragm. Limited evaluation for pulmonary nodule due to respiratory motion artifact. No pulmonary mass. No focal consolidation. No pleural effusion. No pneumothorax. Musculoskeletal: No chest wall abnormality. No suspicious lytic or blastic osseous lesions. No acute displaced fracture. Multilevel degenerative changes of the spine. CT ABDOMEN PELVIS FINDINGS Hepatobiliary: No focal liver abnormality. Gallstone noted within the gallbladder lumen. No gallbladder wall thickening or pericholecystic fluid. No biliary dilatation. Pancreas: No focal lesion. Normal pancreatic contour. No surrounding inflammatory changes. No main pancreatic ductal dilatation. Spleen: Normal in size without focal abnormality. Adrenals/Urinary Tract: No adrenal nodule bilaterally. Bilateral kidneys enhance symmetrically. Exophytic hypodense 2.2 cm lesion within the right  kidney likely represent simple renal cysts. Slightly increased density likely due to streak artifact at this level. No hydronephrosis. No hydroureter. The urinary bladder is unremarkable. On delayed imaging, there is no urothelial wall thickening and there are no filling defects in the opacified portions of the bilateral collecting systems or ureters. Stomach/Bowel: Stomach is within normal limits. No evidence of bowel wall thickening or dilatation. 8 cm stool ball within the rectum. The appendix not definitely identified. Vascular/Lymphatic: No abdominal aorta or iliac aneurysm. Mild atherosclerotic plaque of the aorta and its branches. No abdominal, pelvic, or inguinal lymphadenopathy. Reproductive: Difficult to measure grossly stable uterine fibroids. No adnexal lesion. Other: No intraperitoneal free fluid. No intraperitoneal free gas. No organized fluid collection. Musculoskeletal: Tiny fat containing left inguinal hernia. No suspicious lytic or blastic osseous lesions. No acute displaced fracture. Multilevel degenerative changes of the spine. IMPRESSION: 1. Main pulmonary artery measures at the upper limits of normal. Correlate with pulmonary hypertension. 2. Otherwise no acute intrathoracic abnormality. 3. Cholelithiasis with no CT findings of acute cholecystitis. 4. An  8 cm stool ball within the rectum with no associated findings suggest of stercoral colitis. 5. 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 Electronically Signed   By: Iven Finn M.D.   On: 11/17/2020 20:07   MR BRAIN W WO CONTRAST  Result Date: 11/19/2020 CLINICAL DATA:  Mental status change.  Encephalopathy EXAM: MRI HEAD WITHOUT AND WITH CONTRAST TECHNIQUE: Multiplanar, multiecho pulse sequences of the brain and surrounding structures were obtained without and with intravenous contrast. CONTRAST:  7.45m GADAVIST GADOBUTROL 1 MMOL/ML IV SOLN COMPARISON:  CT head 11/17/2020.  MRI head 12/24/2019 FINDINGS: Brain: Patchy areas of  hyperintensity in the left occipital pole extending into the left posterior parietal lobe best seen on FLAIR. This involves primarily the white matter but some areas of cortex. This area does not show restricted diffusion or susceptibility. This area was normal on the prior MRI 2021. No abnormal enhancement. Moderate atrophy. Mild white matter changes consistent with chronic microvascular ischemia. Diffusion-weighted imaging negative for acute infarct. No hemorrhage or mass lesion. Vascular: Normal arterial flow voids Skull and upper cervical spine: Negative Sinuses/Orbits: Mild mucosal edema paranasal sinuses. Negative orbit Other: None IMPRESSION: Ill-defined patchy hyperintensity in the left occipital parietal lobe without restricted diffusion or abnormal enhancement. Differential diagnosis includes late subacute infarct, encephalitis. Tumor not considered likely. Correlate with symptoms. Electronically Signed   By: CFranchot GalloM.D.   On: 11/19/2020 15:24   CT Abdomen Pelvis W Contrast  Result Date: 11/17/2020 CLINICAL DATA:  Fever, altered mental status.  Nausea and vomiting. EXAM: CT CHEST, ABDOMEN, AND PELVIS WITH CONTRAST TECHNIQUE: Multidetector CT imaging of the chest, abdomen and pelvis was performed following the standard protocol during bolus administration of intravenous contrast. CONTRAST:  10103mOMNIPAQUE IOHEXOL 300 MG/ML  SOLN COMPARISON:  CT abdomen pelvis 12/20/2019, CT angiography chest 12/24/2019 FINDINGS: CT CHEST FINDINGS Cardiovascular: Normal heart size. No significant pericardial effusion. The thoracic aorta is normal in caliber. Mild atherosclerotic plaque of the thoracic aorta. The main pulmonary artery measures at the upper limits of normal: 3.2 cm. Mediastinum/Nodes: No enlarged mediastinal, hilar, or axillary lymph nodes. Thyroid gland, trachea, and esophagus demonstrate no significant findings. Lungs/Pleura: Right lower lobe atelectasis with persistent elevated right  hemidiaphragm. Limited evaluation for pulmonary nodule due to respiratory motion artifact. No pulmonary mass. No focal consolidation. No pleural effusion. No pneumothorax. Musculoskeletal: No chest wall abnormality. No suspicious lytic or blastic osseous lesions. No acute displaced fracture. Multilevel degenerative changes of the spine. CT ABDOMEN PELVIS FINDINGS Hepatobiliary: No focal liver abnormality. Gallstone noted within the gallbladder lumen. No gallbladder wall thickening or pericholecystic fluid. No biliary dilatation. Pancreas: No focal lesion. Normal pancreatic contour. No surrounding inflammatory changes. No main pancreatic ductal dilatation. Spleen: Normal in size without focal abnormality. Adrenals/Urinary Tract: No adrenal nodule bilaterally. Bilateral kidneys enhance symmetrically. Exophytic hypodense 2.2 cm lesion within the right kidney likely represent simple renal cysts. Slightly increased density likely due to streak artifact at this level. No hydronephrosis. No hydroureter. The urinary bladder is unremarkable. On delayed imaging, there is no urothelial wall thickening and there are no filling defects in the opacified portions of the bilateral collecting systems or ureters. Stomach/Bowel: Stomach is within normal limits. No evidence of bowel wall thickening or dilatation. 8 cm stool ball within the rectum. The appendix not definitely identified. Vascular/Lymphatic: No abdominal aorta or iliac aneurysm. Mild atherosclerotic plaque of the aorta and its branches. No abdominal, pelvic, or inguinal lymphadenopathy. Reproductive: Difficult to measure grossly stable uterine fibroids. No adnexal lesion. Other:  No intraperitoneal free fluid. No intraperitoneal free gas. No organized fluid collection. Musculoskeletal: Tiny fat containing left inguinal hernia. No suspicious lytic or blastic osseous lesions. No acute displaced fracture. Multilevel degenerative changes of the spine. IMPRESSION: 1. Main  pulmonary artery measures at the upper limits of normal. Correlate with pulmonary hypertension. 2. Otherwise no acute intrathoracic abnormality. 3. Cholelithiasis with no CT findings of acute cholecystitis. 4. An 8 cm stool ball within the rectum with no associated findings suggest of stercoral colitis. 5. 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 Electronically Signed   By: Iven Finn M.D.   On: 11/17/2020 20:07   IR Fluoro Guide CV Line Right  Result Date: 11/25/2020 INDICATION: 75 year old female with dementia and infection requiring intravenous antibiotics. She has repeatedly pulled out every IV that has been placed. She requires durable venous access that is well secured. Therefore, she presents for placement of a tunneled central venous catheter. EXAM: IR ULTRASOUND GUIDANCE VASC ACCESS RIGHT; IR RIGHT FLUORO GUIDE CV LINE MEDICATIONS: None ANESTHESIA/SEDATION: None FLUOROSCOPY TIME:  Fluoroscopy Time: 0 minutes 12 seconds (5 mGy). COMPLICATIONS: None immediate. PROCEDURE: Informed written consent was obtained from the patient after a thorough discussion of the procedural risks, benefits and alternatives. All questions were addressed. Maximal Sterile Barrier Technique was utilized including caps, mask, sterile gowns, sterile gloves, sterile drape, hand hygiene and skin antiseptic. A timeout was performed prior to the initiation of the procedure. The right internal jugular vein was interrogated with ultrasound and found to be widely patent. An image was obtained and stored for the medical record. Local anesthesia was attained by infiltration with 1% lidocaine. A small dermatotomy was made. Under real-time sonographic guidance, the vessel was punctured with a 21 gauge micropuncture needle. Using standard technique, the initial micro needle was exchanged over a 0.018 micro wire for a peel-away sheath. A suitable skin exit site inferior to the clavicle was selected. Local anesthesia was again attained by  infiltration with 1% lidocaine. A small dermatotomy was made. The tunneled central venous catheter was then tunneled from the skin exit site to the dermatotomy overlying the venous access site. The catheter was cut to 22 cm (14 cm intravascular length) and advanced through the peel-away sheath. An image was obtained for the medical record documenting the tip of the catheter in the upper right atrium. The catheter was then capped and flushed. The catheter was secured to the skin at 3 locations using 0 Prolene suture. The dermatotomy over the venous access site was closed with Dermabond. Sterile bandages were placed. IMPRESSION: Successful placement of a power injectable dual lumen tunneled central venous catheter via the right internal jugular vein. The catheter tip is in the upper right atrium and the catheter is ready for immediate use. Electronically Signed   By: Jacqulynn Cadet M.D.   On: 11/25/2020 17:07   IR US Guide Vasc Access Right  Result Date: 11/25/2020 INDICATION: 75 year old female with dementia and infection requiring intravenous antibiotics. She has repeatedly pulled out every IV that has been placed. She requires durable venous access that is well secured. Therefore, she presents for placement of a tunneled central venous catheter. EXAM: IR ULTRASOUND GUIDANCE VASC ACCESS RIGHT; IR RIGHT FLUORO GUIDE CV LINE MEDICATIONS: None ANESTHESIA/SEDATION: None FLUOROSCOPY TIME:  Fluoroscopy Time: 0 minutes 12 seconds (5 mGy). COMPLICATIONS: None immediate. PROCEDURE: Informed written consent was obtained from the patient after a thorough discussion of the procedural risks, benefits and alternatives. All questions were addressed. Maximal Sterile Barrier Technique was utilized  including caps, mask, sterile gowns, sterile gloves, sterile drape, hand hygiene and skin antiseptic. A timeout was performed prior to the initiation of the procedure. The right internal jugular vein was interrogated with ultrasound  and found to be widely patent. An image was obtained and stored for the medical record. Local anesthesia was attained by infiltration with 1% lidocaine. A small dermatotomy was made. Under real-time sonographic guidance, the vessel was punctured with a 21 gauge micropuncture needle. Using standard technique, the initial micro needle was exchanged over a 0.018 micro wire for a peel-away sheath. A suitable skin exit site inferior to the clavicle was selected. Local anesthesia was again attained by infiltration with 1% lidocaine. A small dermatotomy was made. The tunneled central venous catheter was then tunneled from the skin exit site to the dermatotomy overlying the venous access site. The catheter was cut to 22 cm (14 cm intravascular length) and advanced through the peel-away sheath. An image was obtained for the medical record documenting the tip of the catheter in the upper right atrium. The catheter was then capped and flushed. The catheter was secured to the skin at 3 locations using 0 Prolene suture. The dermatotomy over the venous access site was closed with Dermabond. Sterile bandages were placed. IMPRESSION: Successful placement of a power injectable dual lumen tunneled central venous catheter via the right internal jugular vein. The catheter tip is in the upper right atrium and the catheter is ready for immediate use. Electronically Signed   By: Jacqulynn Cadet M.D.   On: 11/25/2020 17:07   DG Chest Port 1 View  Result Date: 11/27/2020 CLINICAL DATA:  Shortness of breath EXAM: PORTABLE CHEST 1 VIEW COMPARISON:  May 05, 2020 FINDINGS: The heart size and mediastinal contours are stable. Right central venous line is identified with distal tip in the superior vena cava. There is chronic elevation of right hemidiaphragm. Mild atelectasis of right lung base is noted. The left lung is clear. The visualized skeletal structures are unremarkable. IMPRESSION: Mild atelectasis of right lung base.  Electronically Signed   By: Abelardo Diesel M.D.   On: 11/27/2020 08:21   EEG adult  Result Date: 11/21/2020 Plancher, Baron Sane, MD     11/21/2020  3:50 PM PROCEDURE: EEG routine 22 minutes with video study  DATES OF TEST: 11/21/2020  REASON FOR TEST: acute encephalopathy  AED/Sedative MEDICATIONS: n/a  TECHNIQUE: This is an 18 channel digital EEG recording using the standard international 10/20 system of electrode placement with one channel EKG recording. Pt was awake during this recording.  FINDINGS: Background rhythm: symmetric diffuse 4-8 Hz, mostly myogenic artifact. Amplitude :  low Continuity: continuous Breach effect:  NO Variability: YES Reactivity: YES Rhythmic delta activity: NO Periodic discharges: NO Sporadic epileptiform discharges: NO Electrographic/electroclinical seizures: NO  Limited EKG reveals no abnormalities.   IMPRESSION: This is an abnormal study due to - 1. Moderate to mild Generalized slowing is a non-specific finding consistent with a generalized disturbance of cerebral functioning including toxic, metabolic, or structural abnormalities that are multi-focal or diffuse. 2. No definite epileptiform discharges or electrographic seizures noted.    ECHOCARDIOGRAM COMPLETE  Result Date: 11/19/2020    ECHOCARDIOGRAM REPORT   Patient Name:   PORFIRIA HEINRICH Date of Exam: 11/19/2020 Medical Rec #:  [id number]      Height:       66.0 in Accession #:    [id number]     Weight:       195.2 lb Date of Birth:  1945/09/16  BSA:          1.979 m Patient Age:    57 years       BP:           145/76 mmHg Patient Gender: F              HR:           75 bpm. Exam Location:  Inpatient Procedure: 2D Echo Indications:    bacteremia  History:        Patient has prior history of Echocardiogram examinations, most                 recent 12/23/2019. CHF, Arrythmias:Atrial Fibrillation,                 Signs/Symptoms:Bacteremia; Risk Factors:Diabetes.  Sonographer:    Johny Chess Referring Phys: [id number] LFYBO  LAT[facility]  Sonographer Comments: Image acquisition challenging due to uncooperative patient and Image acquisition challenging due to patient body habitus. IMPRESSIONS  1. Limited study due to poor acoustic windows. Left ventricular ejection fraction, by estimation, is 60 to 65%. The left ventricle has normal function. The left ventricle has no regional wall motion abnormalities. There is mild concentric left ventricular hypertrophy. Left ventricular diastolic parameters are consistent with Grade I diastolic dysfunction (impaired relaxation).  2. Right ventricular systolic function is mildly reduced. The right ventricular size is mildly-to-moderately enlarged.  3. The mitral valve is normal in structure. Trivial mitral valve regurgitation. No evidence of mitral stenosis.  4. The aortic valve is tricuspid. There is mild calcification of the aortic valve. There is mild thickening of the aortic valve. Aortic valve regurgitation is not visualized. Mild aortic valve sclerosis is present, with no evidence of aortic valve stenosis.  5. No valvular vegetations visualized on surface echo. Pending clinical suspicion for infective endocarditis, can consider TEE for further evaluation. Comparison(s): Compared to prior TTE on 17/51/02, the RV systolic function appears mildly improved. Otherwise there is no significant change. Conclusion(s)/Recommendation(s): No evidence of valvular vegetations on this transthoracic echocardiogram. Would recommend a transesophageal echocardiogram to exclude infective endocarditis if clinically indicated. FINDINGS  Left Ventricle: Limited study due to poor acoustic windows. Left ventricular ejection fraction, by estimation, is 60 to 65%. The left ventricle has normal function. The left ventricle has no regional wall motion abnormalities. Definity contrast agent was given IV to delineate the left ventricular endocardial borders. The left ventricular internal cavity size was normal in size. There  is mild concentric left ventricular hypertrophy. Left ventricular diastolic parameters are consistent with Grade I diastolic dysfunction (impaired relaxation). Right Ventricle: The right ventricular size is mildly-to-moderately enlarged. Right vetricular wall thickness was not well visualized. Right ventricular systolic function is mildly reduced. Left Atrium: Left atrial size was normal in size. Right Atrium: Right atrial size was normal in size. Pericardium: There is no evidence of pericardial effusion. Mitral Valve: The mitral valve is normal in structure. There is mild thickening of the mitral valve leaflet(s). There is mild calcification of the mitral valve leaflet(s). Trivial mitral valve regurgitation. No evidence of mitral valve stenosis. Tricuspid Valve: The tricuspid valve is normal in structure. Tricuspid valve regurgitation is trivial. Aortic Valve: The aortic valve is tricuspid. There is mild calcification of the aortic valve. There is mild thickening of the aortic valve. Aortic valve regurgitation is not visualized. Mild aortic valve sclerosis is present, with no evidence of aortic valve stenosis. Pulmonic Valve: The pulmonic valve was not well visualized. Pulmonic valve regurgitation is not  visualized. Aorta: The aortic root and ascending aorta are structurally normal, with no evidence of dilitation. IAS/Shunts: No atrial level shunt detected by color flow Doppler.  LEFT VENTRICLE PLAX 2D LVIDd:         4.26 cm LVIDs:         2.71 cm LV PW:         1.17 cm LV IVS:        1.06 cm LVOT diam:     1.80 cm LVOT Area:     2.54 cm  RIGHT VENTRICLE RV S prime:     9.90 cm/s LEFT ATRIUM         Index LA diam:    3.80 cm 1.92 cm/m   AORTA Ao Root diam: 2.90 cm Ao Asc diam:  3.40 cm  SHUNTS Systemic Diam: 1.80 cm Gwyndolyn Kaufman MD Electronically signed by Gwyndolyn Kaufman MD Signature Date/Time: 11/19/2020/5:00:20 PM    Final    [place] EKG SITE RITE  Result Date: 11/23/2020 If Site Rite image not attached,  placement could not be confirmed due to current cardiac rhythm.  DG FL GUIDED LUMBAR PUNCTURE  Result Date: 11/18/2020 CLINICAL DATA:  Encephalopathy.  Fever.  Rule out meningitis EXAM: DIAGNOSTIC LUMBAR PUNCTURE UNDER FLUOROSCOPIC GUIDANCE COMPARISON:  CT abdomen pelvis 11/17/2020 FLUOROSCOPY TIME:  Fluoroscopy Time:  0 minutes 48 seconds Radiation Exposure Index (if provided by the fluoroscopic device): Number of Acquired Spot Images: 1 PROCEDURE: Patient was sedated prior to the study. The patient was uncooperative. Informed consent was obtained from the patient's brother by telephone prior to the procedure, including potential complications of headache, allergy, and pain. With the patient prone, the lower back was prepped with Betadine. 1% Lidocaine was used for local anesthesia. Lumbar puncture was performed at the L2-3 level using a 20 gauge needle with return of clear CSF with an opening pressure of 13 cm water. Six ml of CSF were obtained for laboratory studies. The patient tolerated the procedure well and there were no apparent complications. IMPRESSION: Successful lumbar puncture.  Clear CSF. Electronically Signed   By: Franchot Gallo M.D.   On: 11/18/2020 14:20

## 2020-11-28 NOTE — Progress Notes (Signed)
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 So far LP, CT scan chest abdomen pelvis nonacute, MRI with questionable occipital lobe changes.    PT Comments    Pt agreeable to therapy today and remained calm throughout. Pt required max A +2 to roll to R and come to sitting EOB. Pt with heavy R and posterior lean in sitting. Worked on reaching and balance activities in sitting before progressing to standing. Pt able to maintain midline posture for short bouts after working on reaching but returns to R lean with fatigue. Pt performed sit to stand x3 to RW with mod A +2. Worked on pregait stepping with hopes to step to chair but though pt could step in place, she was unable to move feet effectively to step fwd and to R. Pt will need stedy transfer to chair later today with nursing. PT will continue to follow.    Follow Up Recommendations  SNF     Equipment Recommendations  Hospital bed    Recommendations for Other Services OT consult (Resting palm guard as fingers on digging in R hand.)     Precautions / Restrictions Precautions Precautions: Fall Precaution Comments: easily agitated, behavioral Restrictions Weight Bearing Restrictions: No    Mobility  Bed Mobility Overal bed mobility: Needs Assistance Bed Mobility: Rolling;Supine to Sit;Sit to Supine Rolling: Max assist   Supine to sit: Max assist;+2 for physical assistance;+2 for safety/equipment Sit to supine: Total assist;+2 for physical assistance;+2 for  safety/equipment   General bed mobility comments: pt able to roll R with hand over hand facilitation of L hand to rail. Max A to LE's for full SL position. Max A at LE's and trunk to come SL to sit. Tot A +2 at trunk and LE's to return to supine    Transfers Overall transfer level: Needs assistance Equipment used: Rolling walker (2 wheeled) Transfers: Sit to/from Stand Sit to Stand: +2 physical assistance;Mod assist;From elevated surface         General transfer comment: pt stood to RW 3x with mod A +2 with R lean. Attempted to take side steps to chair on R but pt ineffective at stepping feet, only able to step them in place.  Ambulation/Gait Ambulation/Gait assistance:  (unable)           General Gait Details: unable   Stairs             Wheelchair Mobility    Modified Rankin (Stroke Patients Only)       Balance Overall balance assessment: Needs assistance Sitting-balance support: Bilateral upper extremity supported;No upper extremity supported Sitting balance-Leahy Scale: Poor Sitting balance - Comments: heavy R and posterior lean in sitting, mod to max A to maintain upright. Is able to follow commands to come fwd and to L but maintain only a short period of time Postural control: Posterior lean;Right lateral lean Standing balance support: Bilateral upper extremity supported;During functional activity Standing balance-Leahy Scale: Poor Standing balance comment: BUE support and max A +2 to maintain standing  High Level Balance Comments: worked on reaching in sitting, supported R arm and independently with L arm. Reaching fwd and to L did elicit midline sitting for short periods            Cognition Arousal/Alertness: Awake/alert Behavior During Therapy: WFL for tasks assessed/performed Overall Cognitive Status: History of cognitive impairments - at baseline Area of Impairment: Orientation;Following commands;Safety/judgement;Problem  solving                 Orientation Level: Disoriented to;Place;Time;Situation     Following Commands: Follows one step commands consistently;Follows multi-step commands inconsistently Safety/Judgement: Decreased awareness of deficits;Decreased awareness of safety   Problem Solving: Slow processing;Decreased initiation;Difficulty sequencing;Requires verbal cues;Requires tactile cues General Comments: pt agreeable today. Persistent in relaying story of her R hand injury but did not get agitated throughout session      Exercises      General Comments        Pertinent Vitals/Pain Pain Assessment: Faces Faces Pain Scale: No hurt    Home Living                      Prior Function            PT Goals (current goals can now be found in the care plan section) Acute Rehab PT Goals Patient Stated Goal: none stated PT Goal Formulation: Patient unable to participate in goal setting Time For Goal Achievement: 12/04/20 Potential to Achieve Goals: Fair Progress towards PT goals: Progressing toward goals    Frequency    Min 2X/week      PT Plan Current plan remains appropriate    Co-evaluation              AM-PAC PT "6 Clicks" Mobility   Outcome Measure  Help needed turning from your back to your side while in a flat bed without using bedrails?: A Lot Help needed moving from lying on your back to sitting on the side of a flat bed without using bedrails?: A Lot Help needed moving to and from a bed to a chair (including a wheelchair)?: Total Help needed standing up from a chair using your arms (e.g., wheelchair or bedside chair)?: Total Help needed to walk in hospital room?: Total Help needed climbing 3-5 steps with a railing? : Total 6 Click Score: 8    End of Session Equipment Utilized During Treatment: Gait belt Activity Tolerance: Patient tolerated treatment well Patient left: in bed;with call bell/phone within reach;with bed alarm set Nurse  Communication: Mobility status PT Visit Diagnosis: Muscle weakness (generalized) (M62.81);Difficulty in walking, not elsewhere classified (R26.2);Other symptoms and signs involving the nervous system (R29.898);Hemiplegia and hemiparesis     Time: 1202-1236 PT Time Calculation (min) (ACUTE ONLY): 34 min  Charges:  $Therapeutic Activity: 8-22 mins $Neuromuscular Re-education: 8-22 mins                     Lyanne Co, PT  Acute Rehab Services  Pager [phone] Office [phone]    Lawana Chambers Mordecai Tindol 11/28/2020, 1:45 PM

## 2020-11-28 NOTE — Consult Note (Signed)
[facility] Face-to-Face Psychiatry Consult   Reason for Consult: ''Severe bipolar disorder with psychosis'' Referring Physician:  Dr Thedore Mins Patient Identification: Becky Hendricks MRN:  [id number] Principal Diagnosis: Bipolar affective disorder, currently manic, mild (HCC) Diagnosis:  Principal Problem:   Bipolar affective disorder, currently manic, mild (HCC) Active Problems:   Acute metabolic encephalopathy   Severe sepsis (HCC)   AF (paroxysmal atrial fibrillation) (HCC)   Sacral wound   Chronic diastolic CHF (congestive heart failure) (HCC)   Bipolar disorder (HCC)   SIRS (systemic inflammatory response syndrome) (HCC)   Nausea and vomiting   Mixed diabetic hyperlipidemia associated with type 2 diabetes mellitus (HCC)   Type 2 diabetes mellitus with hyperglycemia, with long-term current use of insulin (HCC)   Constipation   Encephalopathy   MRSA bacteremia  Total Time spent with patient: 20 minutes   11/27/2020 Subjective: '' I am doing much better. Ready to go home. I am taking my medication. And I am ready to go home now where I belong. .''  Objective: Patient seen, assessed and chart reviewed. She is alert and oriented x 3, very pleasant and engaging well with this provider. She is observed to be smiling and interacting with staff in the room. She appears to have improved greatly over the weekend. As noted by her favorable response to current psychiatric medications ( Risperdal and Depakote). Today on evaluation she continues to deny psychosis, hallucinations and or delusions. SHe does not appear to be exhibiting any delusions, or responding to internal or external stimuli.   Past Psychiatric History: bipolar  Risk to Self:  none Risk to Others:  none Prior Inpatient Therapy:  unknown Prior Outpatient Therapy:  unknown  Past Medical History:  Past Medical History:  Diagnosis Date  . Atrial fibrillation (HCC)   . Bipolar disorder (HCC)   . Bipolar disorder (HCC) 04/27/2020  .  Clostridium difficile diarrhea 08/02/2019  . Dehydration   . Essential hypertension   . Fall 02/11/2020  . Morbid obesity (HCC)   . Osteomyelitis (HCC) 12/03/2019  . Pneumonia due to COVID-19 virus 09/15/2019  . Type 2 diabetes mellitus (HCC)   . Weakness     Past Surgical History:  Procedure Laterality Date  . INCISION AND DRAINAGE PERIRECTAL ABSCESS Left 12/04/2019   Procedure: IRRIGATION AND DEBRIDEMENT LEFT BUTTOCK ABSCESS;  Surgeon: Violeta Gelinas, MD;  Location: [facility] OR;  Service: General;  Laterality: Left;  . INCISION AND DRAINAGE PERIRECTAL ABSCESS Left 12/10/2019   Procedure: REPEAT IRRIGATION AND DEBRIDEMENT BUTTOCK  ABSCESS;  Surgeon: Abigail Miyamoto, MD;  Location: [facility] OR;  Service: General;  Laterality: Left;  . IR FLUORO GUIDE CV LINE RIGHT  11/25/2020  . IR US GUIDE VASC ACCESS RIGHT  11/25/2020   Family History:  Family History  Family history unknown: Yes   Family Psychiatric  History: unknown Social History:  Social History   Substance and Sexual Activity  Alcohol Use Not Currently     Social History   Substance and Sexual Activity  Drug Use Not Currently    Social History   Socioeconomic History  . Marital status: Single    Spouse name: Not on file  . Number of children: Not on file  . Years of education: Not on file  . Highest education level: Not on file  Occupational History  . Not on file  Tobacco Use  . Smoking status: Never Smoker  . Smokeless tobacco: Never Used  Vaping Use  . Vaping Use: Unknown  Substance and Sexual Activity  .  Alcohol use: Not Currently  . Drug use: Not Currently  . Sexual activity: Not Currently    Birth control/protection: Post-menopausal  Other Topics Concern  . Not on file  Social History Narrative  . Not on file   Social Determinants of Health   Financial Resource Strain: Not on file  Food Insecurity: Not on file  Transportation Needs: Not on file  Physical Activity: Not on file  Stress: Not on file  Social  Connections: Not on file   Additional Social History:    Allergies:   Allergies  Allergen Reactions  . Chlorhexidine Gluconate Itching  . Acetazolamide Er Rash    Labs:  Results for orders placed or performed during the hospital encounter of 11/17/20 (from the past 48 hour(s))  Glucose, capillary     Status: Abnormal   Collection Time: 11/26/20 11:15 AM  Result Value Ref Range   Glucose-Capillary 209 (H) 70 - 99 mg/dL    Comment: Glucose reference range applies only to samples taken after fasting for at least 8 hours.  Glucose, capillary     Status: Abnormal   Collection Time: 11/26/20  6:08 PM  Result Value Ref Range   Glucose-Capillary 188 (H) 70 - 99 mg/dL    Comment: Glucose reference range applies only to samples taken after fasting for at least 8 hours.  Glucose, capillary     Status: Abnormal   Collection Time: 11/27/20 12:17 AM  Result Value Ref Range   Glucose-Capillary 147 (H) 70 - 99 mg/dL    Comment: Glucose reference range applies only to samples taken after fasting for at least 8 hours.  Procalcitonin     Status: None   Collection Time: 11/27/20  1:03 AM  Result Value Ref Range   Procalcitonin <0.10 ng/mL    Comment:        Interpretation: PCT (Procalcitonin) <= 0.5 ng/mL: Systemic infection (sepsis) is not likely. Local bacterial infection is possible. (NOTE)       Sepsis PCT Algorithm           Lower Respiratory Tract                                      Infection PCT Algorithm    ----------------------------     ----------------------------         PCT < 0.25 ng/mL                PCT < 0.10 ng/mL          Strongly encourage             Strongly discourage   discontinuation of antibiotics    initiation of antibiotics    ----------------------------     -----------------------------       PCT 0.25 - 0.50 ng/mL            PCT 0.10 - 0.25 ng/mL               OR       >80% decrease in PCT            Discourage initiation of                                             antibiotics      Encourage discontinuation  of antibiotics    ----------------------------     -----------------------------         PCT >= 0.50 ng/mL              PCT 0.26 - 0.50 ng/mL               AND        <80% decrease in PCT             Encourage initiation of                                             antibiotics       Encourage continuation           of antibiotics    ----------------------------     -----------------------------        PCT >= 0.50 ng/mL                  PCT > 0.50 ng/mL               AND         increase in PCT                  Strongly encourage                                      initiation of antibiotics    Strongly encourage escalation           of antibiotics                                     -----------------------------                                           PCT <= 0.25 ng/mL                                                 OR                                        > 80% decrease in PCT                                      Discontinue / Do not initiate                                             antibiotics  Performed at [facility], [address] [place], [place] [address]   Magnesium     Status: None   Collection Time: 11/27/20  1:03 AM  Result Value Ref Range   Magnesium 2.0 1.7 - 2.4 mg/dL  Comment: Performed at [facility], [address] [place], [place] [address]  Comprehensive metabolic panel     Status: Abnormal   Collection Time: 11/27/20  1:03 AM  Result Value Ref Range   Sodium 140 135 - 145 mmol/L   Potassium 4.9 3.5 - 5.1 mmol/L   Chloride 102 98 - 111 mmol/L   CO2 33 (H) 22 - 32 mmol/L   Glucose, Bld 153 (H) 70 - 99 mg/dL    Comment: Glucose reference range applies only to samples taken after fasting for at least 8 hours.   BUN 15 8 - 23 mg/dL   Creatinine, Ser 6.04 0.44 - 1.00 mg/dL   Calcium 8.5 (L) 8.9 - 10.3 mg/dL   Total Protein 4.8 (L) 6.5 - 8.1 g/dL   Albumin 2.3 (L) 3.5  - 5.0 g/dL   AST 9 (L) 15 - 41 U/L   ALT 12 0 - 44 U/L   Alkaline Phosphatase 49 38 - 126 U/L   Total Bilirubin 0.3 0.3 - 1.2 mg/dL   GFR, Estimated >54 >09 mL/min    Comment: (NOTE) Calculated using the CKD-EPI Creatinine Equation (2021)    Anion gap 5 5 - 15    Comment: Performed at [facility], [address], [place], [place] [address]  CBC with Differential/Platelet     Status: Abnormal   Collection Time: 11/27/20  1:03 AM  Result Value Ref Range   WBC 11.5 (H) 4.0 - 10.5 K/uL   RBC 3.84 (L) 3.87 - 5.11 MIL/uL   Hemoglobin 11.1 (L) 12.0 - 15.0 g/dL   HCT 47.8 (L) 29.5 - 62.1 %   MCV 93.0 80.0 - 100.0 fL   MCH 28.9 26.0 - 34.0 pg   MCHC 31.1 30.0 - 36.0 g/dL   RDW 30.8 65.7 - 84.6 %   Platelets 212 150 - 400 K/uL   nRBC 0.0 0.0 - 0.2 %   Neutrophils Relative % 48 %   Neutro Abs 5.5 1.7 - 7.7 K/uL   Lymphocytes Relative 38 %   Lymphs Abs 4.4 (H) 0.7 - 4.0 K/uL   Monocytes Relative 10 %   Monocytes Absolute 1.1 (H) 0.1 - 1.0 K/uL   Eosinophils Relative 3 %   Eosinophils Absolute 0.4 0.0 - 0.5 K/uL   Basophils Relative 0 %   Basophils Absolute 0.0 0.0 - 0.1 K/uL   Immature Granulocytes 1 %   Abs Immature Granulocytes 0.07 0.00 - 0.07 K/uL    Comment: Performed at [facility], [address], [place], [place] [address]  C-reactive protein     Status: Abnormal   Collection Time: 11/27/20  1:03 AM  Result Value Ref Range   CRP 1.0 (H) <1.0 mg/dL    Comment: Performed at [facility], [address], [place], [place] [address]  Brain natriuretic peptide     Status: None   Collection Time: 11/27/20  1:03 AM  Result Value Ref Range   B Natriuretic Peptide 23.0 0.0 - 100.0 pg/mL    Comment: Performed at [facility], [address], [place], [place] [address]  Glucose, capillary     Status: Abnormal   Collection Time: 11/27/20  5:35 AM  Result Value Ref Range   Glucose-Capillary 146 (H) 70 - 99 mg/dL    Comment: Glucose reference range applies  only to samples taken after fasting for at least 8 hours.  Glucose, capillary     Status: Abnormal  Collection Time: 11/27/20 11:43 AM  Result Value Ref Range   Glucose-Capillary 222 (H) 70 - 99 mg/dL    Comment: Glucose reference range applies only to samples taken after fasting for at least 8 hours.  Glucose, capillary     Status: Abnormal   Collection Time: 11/27/20  6:30 PM  Result Value Ref Range   Glucose-Capillary 154 (H) 70 - 99 mg/dL    Comment: Glucose reference range applies only to samples taken after fasting for at least 8 hours.  Glucose, capillary     Status: Abnormal   Collection Time: 11/28/20 12:11 AM  Result Value Ref Range   Glucose-Capillary 190 (H) 70 - 99 mg/dL    Comment: Glucose reference range applies only to samples taken after fasting for at least 8 hours.   Comment 1 Notify RN    Comment 2 Document in Chart   CK     Status: None   Collection Time: 11/28/20  5:00 AM  Result Value Ref Range   Total CK 52 38 - 234 U/L    Comment: Performed at [facility], [address] [place], [place] [address]  Magnesium     Status: None   Collection Time: 11/28/20  5:00 AM  Result Value Ref Range   Magnesium 1.9 1.7 - 2.4 mg/dL    Comment: Performed at [facility], [address] [place], [place] [address]  Comprehensive metabolic panel     Status: Abnormal   Collection Time: 11/28/20  5:00 AM  Result Value Ref Range   Sodium 141 135 - 145 mmol/L   Potassium 4.6 3.5 - 5.1 mmol/L   Chloride 100 98 - 111 mmol/L   CO2 34 (H) 22 - 32 mmol/L   Glucose, Bld 131 (H) 70 - 99 mg/dL    Comment: Glucose reference range applies only to samples taken after fasting for at least 8 hours.   BUN 13 8 - 23 mg/dL   Creatinine, Ser 0.980.62 0.44 - 1.00 mg/dL   Calcium 8.9 8.9 - 11.910.3 mg/dL   Total Protein 4.9 (L) 6.5 - 8.1 g/dL   Albumin 2.4 (L) 3.5 - 5.0 g/dL   AST 11 (L) 15 - 41 U/L   ALT 11 0 - 44 U/L   Alkaline Phosphatase 46 38 - 126 U/L   Total Bilirubin 0.4  0.3 - 1.2 mg/dL   GFR, Estimated >14>60 >78>60 mL/min    Comment: (NOTE) Calculated using the CKD-EPI Creatinine Equation (2021)    Anion gap 7 5 - 15    Comment: Performed at [facility], [address] [place], [place] [address]  CBC with Differential/Platelet     Status: Abnormal   Collection Time: 11/28/20  5:00 AM  Result Value Ref Range   WBC 10.0 4.0 - 10.5 K/uL   RBC 3.98 3.87 - 5.11 MIL/uL   Hemoglobin 11.5 (L) 12.0 - 15.0 g/dL   HCT 13.036.4 86.536.0 - 78.446.0 %   MCV 91.5 80.0 - 100.0 fL   MCH 28.9 26.0 - 34.0 pg   MCHC 31.6 30.0 - 36.0 g/dL   RDW 69.614.3 29.511.5 - 28.415.5 %   Platelets 208 150 - 400 K/uL   nRBC 0.0 0.0 - 0.2 %   Neutrophils Relative % 41 %   Neutro Abs 4.1 1.7 - 7.7 K/uL   Lymphocytes Relative 43 %   Lymphs Abs 4.3 (H) 0.7 - 4.0 K/uL   Monocytes Relative 10 %   Monocytes  Absolute 1.0 0.1 - 1.0 K/uL   Eosinophils Relative 5 %   Eosinophils Absolute 0.5 0.0 - 0.5 K/uL   Basophils Relative 0 %   Basophils Absolute 0.0 0.0 - 0.1 K/uL   Immature Granulocytes 1 %   Abs Immature Granulocytes 0.13 (H) 0.00 - 0.07 K/uL    Comment: Performed at [facility], [address] [place], [place] [address]  C-reactive protein     Status: None   Collection Time: 11/28/20  5:00 AM  Result Value Ref Range   CRP 0.7 <1.0 mg/dL    Comment: Performed at [facility], [address], [place], [place] [address]  Brain natriuretic peptide     Status: None   Collection Time: 11/28/20  5:00 AM  Result Value Ref Range   B Natriuretic Peptide 38.6 0.0 - 100.0 pg/mL    Comment: Performed at [facility], [address] [place], [place] [address]  Glucose, capillary     Status: Abnormal   Collection Time: 11/28/20  5:51 AM  Result Value Ref Range   Glucose-Capillary 104 (H) 70 - 99 mg/dL    Comment: Glucose reference range applies only to samples taken after fasting for at least 8 hours.    Current Facility-Administered Medications  Medication Dose Route Frequency  Provider Last Rate Last Admin  . acetaminophen (TYLENOL) tablet 650 mg  650 mg Oral Q6H PRN Shalhoub, Deno Lunger, MD       Or  . acetaminophen (TYLENOL) suppository 650 mg  650 mg Rectal Q6H PRN Shalhoub, Deno Lunger, MD      . amLODipine (NORVASC) tablet 10 mg  10 mg Oral Daily Leroy Sea, MD   10 mg at 11/28/20 0929  . apixaban (ELIQUIS) tablet 5 mg  5 mg Oral BID Marguerita Merles Cabery, DO   5 mg at 11/28/20 5102  . atorvastatin (LIPITOR) tablet 10 mg  10 mg Oral Daily Leroy Sea, MD   10 mg at 11/28/20 0930  . carvedilol (COREG) tablet 6.25 mg  6.25 mg Oral BID WC Leroy Sea, MD   6.25 mg at 11/28/20 0929  . DAPTOmycin (CUBICIN) 700 mg in sodium chloride 0.9 % IVPB  8 mg/kg Intravenous Q2000 Leroy Sea, MD 128 mL/hr at 11/27/20 2338 700 mg at 11/27/20 2338  . divalproex (DEPAKOTE) DR tablet 500 mg  500 mg Oral Q12H Leroy Sea, MD   500 mg at 11/28/20 0553  . docusate sodium (COLACE) capsule 200 mg  200 mg Oral BID Leroy Sea, MD   200 mg at 11/28/20 0928  . hydrALAZINE (APRESOLINE) injection 10 mg  10 mg Intravenous Q6H PRN Leroy Sea, MD   10 mg at 11/21/20 1220  . insulin aspart (novoLOG) injection 0-15 Units  0-15 Units Subcutaneous Q6H Shalhoub, Deno Lunger, MD   3 Units at 11/28/20 0035  . lidocaine (PF) (XYLOCAINE) 1 % injection    PRN Sterling Big, MD   5 mL at 11/25/20 1619  . LORazepam (ATIVAN) injection 1 mg  1 mg Intravenous Q6H PRN Maryagnes Amos, FNP   1 mg at 11/25/20 1405  . multivitamin with minerals tablet 1 tablet  1 tablet Oral Daily Leroy Sea, MD   1 tablet at 11/28/20 0928  . ondansetron (ZOFRAN) injection 4 mg  4 mg Intravenous Q6H PRN Shalhoub, Deno Lunger, MD      . pantoprazole (PROTONIX) EC tablet 40 mg  40 mg  Oral Daily Leroy Sea, MD   40 mg at 11/28/20 0931  . polyethylene glycol (MIRALAX / GLYCOLAX) packet 17 g  17 g Oral BID Leroy Sea, MD   17 g at 11/28/20 0931  . risperiDONE (RISPERDAL  M-TABS) disintegrating tablet 2 mg  2 mg Oral BID Maryagnes Amos, FNP   2 mg at 11/28/20 0929  . tamsulosin (FLOMAX) capsule 0.4 mg  0.4 mg Oral Daily Leroy Sea, MD   0.4 mg at 11/28/20 7824    Musculoskeletal: Strength & Muscle Tone: within normal limits Gait & Station: did not witness Patient leans: N/A  Psychiatric Specialty Exam: Physical Exam Vitals and nursing note reviewed.  Constitutional:      Appearance: Normal appearance.  HENT:     Head: Normocephalic.     Nose: Nose normal.  Pulmonary:     Effort: Pulmonary effort is normal.  Neurological:     General: No focal deficit present.     Mental Status: She is alert.  Psychiatric:        Attention and Perception: Perception normal.        Mood and Affect: Mood and affect normal.        Speech: Speech normal.        Behavior: Behavior is cooperative.        Thought Content: Thought content normal.        Cognition and Memory: Memory normal.        Judgment: Judgment is impulsive.     Review of Systems  Psychiatric/Behavioral: Negative.   All other systems reviewed and are negative.   Blood pressure (!) 118/57, pulse 85, temperature 98 F (36.7 C), temperature source Oral, resp. rate 18, weight 88 kg, SpO2 94 %.Body mass index is 31.31 kg/m.  General Appearance: Casual  Eye Contact:  Fair  Speech:  Normal Rate  Volume:  Normal  Mood:  Euthymic  Affect:  Congruent  Thought Process:  Coherent and Linear  Orientation:  Full (Time, Place, and Person)  Thought Content:  Logical  Suicidal Thoughts:  No  Homicidal Thoughts:  No  Memory:  Immediate;   Fair Recent;   Fair  Judgement:  Intact  Insight:  Present  Psychomotor Activity:  Normal  Concentration:  Concentration: Fair and Attention Span: Fair  Recall:  [employer] of Knowledge:  Fair  Language:  Fair  Akathisia:  No  Handed:  Right  AIMS (if indicated):     Assets:  Desire for Improvement Housing Social Support  ADL's:  Impaired   Cognition:  Impaired,  Mild  Sleep:      Physical Exam: Physical Exam Vitals and nursing note reviewed.  Constitutional:      Appearance: Normal appearance.  HENT:     Head: Normocephalic.     Nose: Nose normal.  Pulmonary:     Effort: Pulmonary effort is normal.  Neurological:     General: No focal deficit present.     Mental Status: She is alert.  Psychiatric:        Attention and Perception: Perception normal.        Mood and Affect: Mood and affect normal.        Speech: Speech normal.        Behavior: Behavior is cooperative.        Thought Content: Thought content normal.        Cognition and Memory: Memory normal.        Judgment: Judgment  is impulsive.    Review of Systems  Psychiatric/Behavioral: Negative.   All other systems reviewed and are negative.  Blood pressure (!) 118/57, pulse 85, temperature 98 F (36.7 C), temperature source Oral, resp. rate 18, weight 88 kg, SpO2 94 %. Body mass index is 31.31 kg/m.  Treatment Plan Summary:  75 year old female with history of Bipolar disorder who stopped taking her medications few months prior to this hospital admission. She was admitted with AMS and aggressive behavior. Today, patient seems cooperative, denies psychosis, delusions, self harming thoughts and showing favorable response to current psychiatric medications.  Recommendations: -Continue Depakote 500 mg BID for aggression/agitation. Depakote level to be ordered on 11/30/2020. Order placed.  -Continue Risperdal 2 mg twice daily for psychosis -Continue serial daily EKGs monitoring, patient remains at risk for prolonged QTc. (460).    Psychiatry to sign off at this time. Patient continues to show modest improvement in mentation, and evidence of stabilization from a psychiatric standpoint. Continue current medications as directed.   Disposition: No evidence of imminent risk to self or others at present.   Supportive therapy provided about ongoing  stressors.  Maryagnes Amos, FNP 11/28/2020 10:35 AM

## 2020-11-28 NOTE — Plan of Care (Signed)
  Problem: Coping: Goal: Level of anxiety will decrease Outcome: Progressing   Problem: Safety: Goal: Ability to remain free from injury will improve Outcome: Progressing   Problem: Clinical Measurements: Goal: Diagnostic test results will improve Outcome: Progressing   Problem: Safety: Goal: Ability to remain free from injury will improve Outcome: Progressing

## 2020-11-28 NOTE — TOC Progression Note (Signed)
Transition of Care [facility]) - Progression Note    Patient Details  Name: Becky Hendricks MRN: [id number] Date of Birth: Apr 13, 1946  [facility] of [facility]) CM/SW Contact  Baldemar [place], [place] Phone Number: 11/28/2020, 4:02 PM  Clinical Narrative:   CSW still has not received a call from Administrator at [place]. CSW obtained the Administrator's direct contact number and called today, left a message requesting a call back. CSW contacted patient's brother, Becky Hendricks, to discuss, and Becky Hendricks indicated that patient is going back to [place] and he has told them as much. Becky Hendricks would like to be informed when patient is ready for discharge and goes back to [place]. CSW attempted to contact the Administrator at [place] again, but no answer. CSW to follow.    Expected Discharge Plan: Skilled Nursing Facility Barriers to Discharge: Continued Medical Work up,SNF Pending bed offer  Expected Discharge Plan and Services Expected Discharge Plan: Skilled Nursing Facility       Living arrangements for the past 2 months: Skilled Nursing Facility                                       Social Determinants of Health (SDOH) Interventions    Readmission Risk Interventions No flowsheet data found.

## 2020-11-29 DIAGNOSIS — F3111 Bipolar disorder, current episode manic without psychotic features, mild: Secondary | ICD-10-CM | POA: Diagnosis not present

## 2020-11-29 LAB — CBC WITH DIFFERENTIAL/PLATELET
Abs Immature Granulocytes: 0.22 10*3/uL — ABNORMAL HIGH (ref 0.00–0.07)
Basophils Absolute: 0.1 10*3/uL (ref 0.0–0.1)
Basophils Relative: 1 %
Eosinophils Absolute: 0.9 10*3/uL — ABNORMAL HIGH (ref 0.0–0.5)
Eosinophils Relative: 8 %
HCT: 33.8 % — ABNORMAL LOW (ref 36.0–46.0)
Hemoglobin: 10.7 g/dL — ABNORMAL LOW (ref 12.0–15.0)
Immature Granulocytes: 2 %
Lymphocytes Relative: 37 %
Lymphs Abs: 3.9 10*3/uL (ref 0.7–4.0)
MCH: 29 pg (ref 26.0–34.0)
MCHC: 31.7 g/dL (ref 30.0–36.0)
MCV: 91.6 fL (ref 80.0–100.0)
Monocytes Absolute: 1 10*3/uL (ref 0.1–1.0)
Monocytes Relative: 10 %
Neutro Abs: 4.5 10*3/uL (ref 1.7–7.7)
Neutrophils Relative %: 42 %
Platelets: 204 10*3/uL (ref 150–400)
RBC: 3.69 MIL/uL — ABNORMAL LOW (ref 3.87–5.11)
RDW: 14.5 % (ref 11.5–15.5)
WBC: 10.5 10*3/uL (ref 4.0–10.5)
nRBC: 0 % (ref 0.0–0.2)

## 2020-11-29 LAB — C-REACTIVE PROTEIN: CRP: 0.9 mg/dL (ref <1.0)

## 2020-11-29 LAB — COMPREHENSIVE METABOLIC PANEL
ALT: 11 U/L (ref 0–44)
AST: 11 U/L — ABNORMAL LOW (ref 15–41)
Albumin: 2.3 g/dL — ABNORMAL LOW (ref 3.5–5.0)
Alkaline Phosphatase: 43 U/L (ref 38–126)
Anion gap: 4 — ABNORMAL LOW (ref 5–15)
BUN: 16 mg/dL (ref 8–23)
CO2: 35 mmol/L — ABNORMAL HIGH (ref 22–32)
Calcium: 8.4 mg/dL — ABNORMAL LOW (ref 8.9–10.3)
Chloride: 99 mmol/L (ref 98–111)
Creatinine, Ser: 0.6 mg/dL (ref 0.44–1.00)
GFR, Estimated: >60 mL/min (ref >60)
Glucose, Bld: 123 mg/dL — ABNORMAL HIGH (ref 70–99)
Potassium: 4.4 mmol/L (ref 3.5–5.1)
Sodium: 138 mmol/L (ref 135–145)
Total Bilirubin: 0.6 mg/dL (ref 0.3–1.2)
Total Protein: 4.8 g/dL — ABNORMAL LOW (ref 6.5–8.1)

## 2020-11-29 LAB — BRAIN NATRIURETIC PEPTIDE: B Natriuretic Peptide: 43.6 pg/mL (ref 0.0–100.0)

## 2020-11-29 LAB — GLUCOSE, CAPILLARY
Glucose-Capillary: 157 mg/dL — ABNORMAL HIGH (ref 70–99)
Glucose-Capillary: 165 mg/dL — ABNORMAL HIGH (ref 70–99)
Glucose-Capillary: 210 mg/dL — ABNORMAL HIGH (ref 70–99)
Glucose-Capillary: 216 mg/dL — ABNORMAL HIGH (ref 70–99)

## 2020-11-29 LAB — PROCALCITONIN: Procalcitonin: <0.1 ng/mL

## 2020-11-29 LAB — MAGNESIUM: Magnesium: 2 mg/dL (ref 1.7–2.4)

## 2020-11-29 NOTE — TOC Progression Note (Signed)
Transition of Care [facility]) - Progression Note    Patient Details  Name: Becky Hendricks MRN: [id number] Date of Birth: 07-15-1946  Transition of Care [facility]) CM/SW Contact  Baldemar [place], [place] Phone Number: 11/29/2020, 2:04 PM  Clinical Narrative:   CSW spoke with Aundra Millet, Administrator at [place], who discussed how the Medical Director will need to speak with MD about how the patient is doing before they can agree to take her back. CSW discussed the patient's improvements, and Aundra Millet provided the name and contact number for the medical director for MD to talk to. CSW sent information to the MD, who called, and said that the medical director never asked for a call. CSW also provided [name] with the number to the nursing station for any nursing questions that [facility] has in order to accept the patient back. Per MD, the patient is anticipated to be ready for discharge around Thursday. [name] to follow and take patient back when medically stable. CSW to follow.    Expected Discharge Plan: Skilled Nursing Facility Barriers to Discharge: Continued Medical Work up  Expected Discharge Plan and Services Expected Discharge Plan: Skilled Nursing Facility       Living arrangements for the past 2 months: Skilled Nursing Facility                                       Social Determinants of Health (SDOH) Interventions    Readmission Risk Interventions No flowsheet data found.

## 2020-11-29 NOTE — Plan of Care (Signed)
  Problem: Coping: Goal: Level of anxiety will decrease Outcome: Progressing   Problem: Safety: Goal: Ability to remain free from injury will improve Outcome: Progressing   Problem: Health Behavior/Discharge Planning: Goal: Ability to manage health-related needs will improve Outcome: Progressing   Problem: Clinical Measurements: Goal: Diagnostic test results will improve Outcome: Progressing   Problem: Activity: Goal: Risk for activity intolerance will decrease Outcome: Progressing   Problem: Nutrition: Goal: Adequate nutrition will be maintained Outcome: Progressing

## 2020-11-29 NOTE — Plan of Care (Signed)
  Problem: Safety: Goal: Ability to remain free from injury will improve 11/29/2020 1112 by Melvenia Needles, RN Outcome: Progressing 11/29/2020 1112 by Melvenia Needles, RN Outcome: Progressing   Problem: Clinical Measurements: Goal: Diagnostic test results will improve 11/29/2020 1112 by Melvenia Needles, RN Outcome: Progressing 11/29/2020 1112 by Melvenia Needles, RN Outcome: Progressing   Problem: Ischemic Stroke/TIA Tissue Perfusion: Goal: Complications of ischemic stroke/TIA will be minimized Outcome: Progressing

## 2020-11-29 NOTE — Progress Notes (Signed)
PROGRESS NOTE                                                                                                                                                                                                             Patient Demographics:    Becky Hendricks, is a 75 y.o. female, DOB - Nov 04, 1945, GGE:[id number]  Outpatient Primary MD for the patient is Becky Denmark, MD    LOS - 11  Admit date - 11/17/2020    Chief Complaint  Patient presents with  . ivc  . 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  So far LP, CT scan chest abdomen pelvis nonacute, MRI with questionable occipital lobe changes.   Subjective:   Patient in bed, appears comfortable, denies any headache, no fever, no chest pain or pressure, no shortness of breath , no abdominal pain. No new focal weakness.    Assessment  & Plan :   1.  Acute toxic encephalopathy gradually progressive at SNF - MRI with ?  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  2. Sepsis in the setting of MRSA bacteremia - ID consulted, on Vancomycin >> Dapto, TTE stable, per ID no need for TEE,  repeat cultures stable thus far.  Per ID total of 4 weeks of IV daptomycin with stop date of 12/17/2020, tunneled PICC line placed on 11/25/2020 as she has pulled multiple IVs.  3.  Bipolar disorder.  Currently on combination of Depakote and risperidone, agitation much improved on 11/29/2018 , psych following.  4. H/O Sacral Osteomyelitis with healed Scaral Decub Ulcer - continue nursing wound care, stable ESR CRP and procalcitonin as well.   5. N&V with stool ball - supportive Rx, now much improved on bowel regimen.  6.  Paroxysmal A. fib.  Mali vas 2 score of greater than 3.  Currently on Eliquis along with Coreg.  7.  Dyslipidemia.  On statin.  8.  Chronic diastolic CHF EF 67%.  Currently compensated.  9.  Obesity.  BMI of 31.  Supportive care follow-up with PCP for weight loss.  10.  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 Vanco stopped 11/20/20. Rash ++ better.  11. ? MRI changes DW Neuro Dr Rory Percy, likely PRESS - BP now in good control.  12.  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  13.  Prolonged QTC.  Due to combination of her psych meds and electrolyte imbalance, with addition of Coreg QTC has normalized 2 days in a row on 11/26/2020 and 11/28/2018, no further EKG.  14.  Urinary retention.  Multiple and I &O's done, now Foley and Flomax from 11/24/20.  Trial of Foley removal on 11/29/2020.  15. DM type II.  For now sliding scale.  Lab Results  Component Value Date   HGBA1C 6.1 (H) 11/18/2020   CBG (last 3)  Recent Labs    11/28/20 1712 11/28/20 2345 11/29/20 0608   GLUCAP 226* 152* 157*        Condition - Fair  Family Communication  : Brother [name] [phone] over the phone on 11/20/2020 at 9:36 AM, 11/21/20, discussed clearly patient's refusal to take medications, has been refusing taking her psych medications for several months.  If significant decline he will consider comfort measures.  For now we are trying her best to optimize medication intake and improve her mentation, 11/24/20  Code Status :  Full  Consults  :  Neuro, Psych, ID, IR for PICC line  PUD Prophylaxis : PPI   Procedures  :     Right IJ based by IR on 11/25/2020.    Foley catheter placed 11/25/2020   MRI - Ill-defined patchy hyperintensity in the left occipital parietal lobe without restricted diffusion or abnormal enhancement. Differential diagnosis includes late subacute infarct, encephalitis.   LP - non acute  CT - 1. Main pulmonary artery measures at the upper limits of normal. Correlate with pulmonary hypertension. 2. Otherwise no acute intrathoracic abnormality. 3. Cholelithiasis with no CT findings of acute cholecystitis. 4. An 8 cm stool ball within the rectum with no associated findings suggest of stercoral colitis. 5. Grossly stable in size uterine fibroids  TTE - 1. Limited study due to poor acoustic windows. Left ventricular ejection fraction, by estimation, is 60 to 65%. The left ventricle has normal function. The left ventricle has no regional wall motion abnormalities. There is mild concentric left ventricular hypertrophy. Left ventricular diastolic parameters are consistent with Grade I diastolic dysfunction (impaired relaxation).  2. Right ventricular systolic function is mildly reduced. The right ventricular size is mildly-to-moderately enlarged.  3. The mitral valve is normal in structure. Trivial mitral valve regurgitation. No evidence of mitral stenosis.  4. The aortic valve is  tricuspid. There is mild calcification of the aortic valve. There is mild thickening  of the aortic valve. Aortic valve regurgitation is not visualized. Mild aortic valve sclerosis is present, with no evidence of aortic valve stenosis.  5. No valvular vegetations visualized on surface echo      Disposition Plan  :    Status is: Inpatient  Remains inpatient appropriate because:IV treatments appropriate due to intensity of illness or inability to take PO   Dispo: The patient is from: SNF              Anticipated d/c is to: SNF              Patient currently is not medically stable to d/c.   Difficult to place patient No   DVT Prophylaxis  :  Eliquis  Lab Results  Component Value Date   PLT 204 11/29/2020    Diet :  Diet Order            Diet regular Room service appropriate? Yes; Fluid consistency: Thin  Diet effective now                  Inpatient Medications  Scheduled Meds: . amLODipine  10 mg Oral Daily  . apixaban  5 mg Oral BID  . atorvastatin  10 mg Oral Daily  . carvedilol  6.25 mg Oral BID WC  . divalproex  500 mg Oral Q12H  . docusate sodium  200 mg Oral BID  . insulin aspart  0-15 Units Subcutaneous Q6H  . multivitamin with minerals  1 tablet Oral Daily  . pantoprazole  40 mg Oral Daily  . polyethylene glycol  17 g Oral BID  . risperiDONE  2 mg Oral BID  . tamsulosin  0.4 mg Oral Daily   Continuous Infusions: . sodium chloride 250 mL (11/28/20 2057)  . DAPTOmycin (CUBICIN)  IV 700 mg (11/28/20 2100)   PRN Meds:.sodium chloride, acetaminophen **OR** acetaminophen, hydrALAZINE, lidocaine (PF), LORazepam, [DISCONTINUED] ondansetron **OR** ondansetron (ZOFRAN) IV  Antibiotics  :    Anti-infectives (From admission, onward)   Start     Dose/Rate Route Frequency Ordered Stop   11/20/20 2000  DAPTOmycin (CUBICIN) 700 mg in sodium chloride 0.9 % IVPB        8 mg/kg  88 kg 128 mL/hr over 30 Minutes Intravenous Daily 11/20/20 1657 12/20/20 2359   11/18/20 2200  vancomycin (VANCOREADY) IVPB 1250 mg/250 mL  Status:  Discontinued         1,250 mg 166.7 mL/hr over 90 Minutes Intravenous Every 24 hours 11/17/20 2345 11/20/20 1644   11/18/20 0000  ampicillin (OMNIPEN) 2 g in sodium chloride 0.9 % 100 mL IVPB  Status:  Discontinued        2 g 300 mL/hr over 20 Minutes Intravenous Every 4 hours 11/17/20 2319 11/18/20 1520   11/17/20 2345  vancomycin (VANCOCIN) IVPB 1000 mg/200 mL premix        1,000 mg 200 mL/hr over 60 Minutes Intravenous  Once 11/17/20 2331 11/18/20 0155   11/17/20 2330  cefTRIAXone (ROCEPHIN) 2 g in sodium chloride 0.9 % 100 mL IVPB  Status:  Discontinued        2 g 200 mL/hr over 30 Minutes Intravenous Every 12 hours 11/17/20 2319 11/18/20 1520       Time Spent in minutes  30   Lala Lund M.D on 11/29/2020 at 8:48 AM  To page go to [email or web address]   [facility] -  Office  [phone]    See all Orders from today for further details    Objective:   Vitals:   11/28/20 2346 11/29/20 0316 11/29/20 0418 11/29/20 0700  BP: 132/76 132/76 (!) 151/77 132/74  Pulse: 71 71 76 96  Resp: _0 Temp: 98 F (36.7 C) 98 F (36.7 C) 98 F (36.7 C) 97.7 F (36.5 C)  TempSrc: Oral Oral Oral Oral  SpO2: 100%  98% 98%  Weight:      Height:  5' 6" (1.676 m)      Wt Readings from Last 3 Encounters:  11/20/20 88 kg  10/12/20 88.5 kg  05/06/20 92.5 kg     Intake/Output Summary (Last 24 hours) at 11/29/2020 0848 Last data filed at 11/29/2020 0453 Gross per 24 hour  Intake 856.12 ml  Output 3050 ml  Net -2193.88 ml     Physical Exam  Awake more alert and not agitated, no focal deficits, diffuse macular rash on arms and trunk much improved, Foley catheter in place, right IJ PICC line in place [place].AT,PERRAL Supple Neck,No JVD, No cervical lymphadenopathy appriciated.  Symmetrical Chest wall movement, Good air movement bilaterally, CTAB RRR,No Gallops, Rubs or new Murmurs, No Parasternal Heave +ve B.Sounds, Abd Soft, No tenderness, No organomegaly appriciated, No rebound - guarding  or rigidity. No Cyanosis, Clubbing or edema, No new Rash or bruise      Data Review:    CBC Recent Labs  Lab 11/25/20 0218 11/26/20 0216 11/27/20 0103 11/28/20 0500 11/29/20 0505  WBC 9.1 8.1 11.5* 10.0 10.5  HGB 11.1* 11.6* 11.1* 11.5* 10.7*  HCT 35.4* 37.0 35.7* 36.4 33.8*  PLT 226 218 212 208 204  MCV 90.8 91.8 93.0 91.5 91.6  MCH 28.5 28.8 28.9 28.9 29.0  MCHC 31.4 31.4 31.1 31.6 31.7  RDW 15.1 14.7 14.5 14.3 14.5  LYMPHSABS 3.8 3.2 4.4* 4.3* 3.9  MONOABS 1.0 0.7 1.1* 1.0 1.0  EOSABS 0.5 0.5 0.4 0.5 0.9*  BASOSABS 0.0 0.0 0.0 0.0 0.1    Recent Labs  Lab 11/25/20 0218 11/26/20 0216 11/27/20 0103 11/28/20 0500 11/29/20 0505  NA 142 140 140 141 138  K 4.3 4.3 4.9 4.6 4.4  CL 105 105 102 100 99  CO2 32 32 33* 34* 35*  GLUCOSE 113* 150* 153* 131* 123*  BUN _1 CREATININE 0.74 0.76 0.81 0.62 0.60  CALCIUM 8.6* 8.2* 8.5* 8.9 8.4*  AST 10* 13* 9* 11* 11*  ALT _2 ALKPHOS 38 46 49 46 43  BILITOT 0.4 0.6 0.3 0.4 0.6  ALBUMIN 2.3* 2.3* 2.3* 2.4* 2.3*  MG 2.0 2.1 2.0 1.9 2.0  CRP 1.0* 1.5* 1.0* 0.7 0.9  PROCALCITON <0.10 <0.10 <0.10 <0.10 <0.10  BNP 28.2 41.7 23.0 38.6 43.6    ------------------------------------------------------------------------------------------------------------------ No results for input(s): CHOL, HDL, LDLCALC, TRIG, CHOLHDL, LDLDIRECT in the last 72 hours.  Lab Results  Component Value Date   HGBA1C 6.1 (H) 11/18/2020   ------------------------------------------------------------------------------------------------------------------ No results for input(s): TSH, T4TOTAL, T3FREE, THYROIDAB in the last 72 hours.  Invalid input(s): FREET3  Cardiac Enzymes No results for input(s): CKMB, TROPONINI, MYOGLOBIN in the last 168 hours.  Invalid input(s): CK ------------------------------------------------------------------------------------------------------------------    Component Value Date/Time   BNP 43.6  11/29/2020 0505     Radiology Reports CT ANGIO HEAD W OR WO CONTRAST  Result Date: 11/21/2020 CLINICAL DATA:  Initial evaluation for neuro deficit, stroke suspected. EXAM: CT ANGIOGRAPHY HEAD AND NECK TECHNIQUE:  Multidetector CT imaging of the head and neck was performed using the standard protocol during bolus administration of intravenous contrast. Multiplanar CT image reconstructions and MIPs were obtained to evaluate the vascular anatomy. Carotid stenosis measurements (when applicable) are obtained utilizing NASCET criteria, using the distal internal carotid diameter as the denominator. CONTRAST:  28m OMNIPAQUE IOHEXOL 350 MG/ML SOLN COMPARISON:  Prior MRI from 11/19/2020. FINDINGS: CT HEAD FINDINGS Brain: Age-related cerebral atrophy with mild chronic small vessel ischemic disease. Recently identified signal changes involving the left parieto-occipital region not visible by CT. No acute intracranial hemorrhage. No acute large vessel territory infarct. No mass lesion or midline shift. No hydrocephalus or extra-axial fluid collection. Vascular: No hyperdense vessel. Scattered vascular calcifications noted within the carotid siphons. Skull: Scalp soft tissues and calvarium within normal limits. Sinuses: Scattered mucosal thickening noted within the ethmoidal air cells. Small air-fluid level noted within the right sphenoid sinus. Paranasal sinuses are otherwise clear. No mastoid effusion. Orbits: Globes and orbital soft tissues within normal limits. Review of the MIP images confirms the above findings CTA NECK FINDINGS Aortic arch: Visualized aortic arch within normal limits for caliber with normal 3 vessel morphology. No hemodynamically significant stenosis about the origin the great vessels. Right carotid system: Right common and internal carotid arteries widely patent without stenosis, dissection or occlusion. Left carotid system: Left common and internal carotid arteries widely patent without stenosis,  dissection or occlusion. Vertebral arteries: Both vertebral arteries arise from subclavian arteries. No proximal subclavian artery stenosis. Moderate narrowing of the left V2 segment at the level of C3 related to an extrinsic compression from adjacent uncovertebral disease. Vertebral arteries otherwise widely patent without stenosis, dissection or occlusion. Skeleton: No acute osseous finding. No discrete or worrisome osseous lesions. Hyperostosis frontalis interna noted. Other neck: No other acute soft tissue abnormality within the neck. No mass or adenopathy. Upper chest: Which lies upper chest demonstrates no acute finding. Review of the MIP images confirms the above findings CTA HEAD FINDINGS Anterior circulation: Both internal carotid arteries widely patent to the termini without stenosis. A1 segments widely patent. Normal anterior communicating artery complex. Both anterior cerebral arteries widely patent to their distal aspects without stenosis. No M1 stenosis or occlusion. Normal MCA bifurcations. Distal MCA branches well perfused and symmetric. Posterior circulation: Both V4 segments patent to the vertebrobasilar junction without stenosis. Neither PICA origin well visualized. Basilar widely patent to its distal aspect without stenosis. Superior cerebellar arteries patent bilaterally. Both PCAs primarily supplied via the basilar and are well perfused to there distal aspects. Venous sinuses: Patent allowing for timing contrast bolus. Anatomic variants: None significant.  No intracranial aneurysm. Review of the MIP images confirms the above findings IMPRESSION: CT HEAD IMPRESSION: 1. No acute intracranial abnormality. Recently identified signal changes at the left parieto-occipital region not visible by CT. 2. Age-related cerebral atrophy with mild chronic small vessel ischemic disease. CTA HEAD AND NECK IMPRESSION: Negative CTA of the head and neck. No large vessel occlusion, hemodynamically significant  stenosis, or other acute vascular abnormality. Electronically Signed   By: BJeannine BogaM.D.   On: 11/21/2020 23:22   DG Abd 1 View  Result Date: 11/27/2020 CLINICAL DATA:  Nausea EXAM: ABDOMEN - 1 VIEW COMPARISON:  None. FINDINGS: The bowel gas pattern is normal. Extensive bowel content is identified throughout colon. No radio-opaque calculi or other significant radiographic abnormality are seen. IMPRESSION: No bowel obstruction. Constipation. Electronically Signed   By: WAbelardo DieselM.D.   On: 11/27/2020 08:22   DG  Abd 1 View  Result Date: 11/21/2020 CLINICAL DATA:  Nausea EXAM: ABDOMEN - 1 VIEW COMPARISON:  None. FINDINGS: There is diffuse stool throughout colon. No bowel dilatation or air-fluid level to suggest bowel obstruction. No free air. There are pelvic calcifications consistent with phleboliths. Visualized lung bases clear. IMPRESSION: Diffuse stool throughout colon. Question a degree of underlying constipation. No bowel obstruction or free air. Electronically Signed   By: Lowella Grip III M.D.   On: 11/21/2020 12:18   CT HEAD WO CONTRAST  Result Date: 11/17/2020 CLINICAL DATA:  Altered mental status. EXAM: CT HEAD WITHOUT CONTRAST TECHNIQUE: Contiguous axial images were obtained from the base of the skull through the vertex without intravenous contrast. COMPARISON:  May 05, 2020 FINDINGS: Brain: There is mild cerebral atrophy with widening of the extra-axial spaces and ventricular dilatation. There are areas of decreased attenuation within the white matter tracts of the supratentorial brain, consistent with microvascular disease changes. Vascular: No hyperdense vessel or unexpected calcification. Skull: Normal. Negative for fracture or focal lesion. Sinuses/Orbits: Mild bilateral ethmoid sinus mucosal thickening is seen. Other: The study is limited secondary to patient motion. IMPRESSION: 1. Generalized cerebral atrophy. 2. No acute intracranial abnormality. Electronically  Signed   By: Virgina Norfolk M.D.   On: 11/17/2020 17:17   CT ANGIO NECK W OR WO CONTRAST  Result Date: 11/21/2020 CLINICAL DATA:  Initial evaluation for neuro deficit, stroke suspected. EXAM: CT ANGIOGRAPHY HEAD AND NECK TECHNIQUE: Multidetector CT imaging of the head and neck was performed using the standard protocol during bolus administration of intravenous contrast. Multiplanar CT image reconstructions and MIPs were obtained to evaluate the vascular anatomy. Carotid stenosis measurements (when applicable) are obtained utilizing NASCET criteria, using the distal internal carotid diameter as the denominator. CONTRAST:  18m OMNIPAQUE IOHEXOL 350 MG/ML SOLN COMPARISON:  Prior MRI from 11/19/2020. FINDINGS: CT HEAD FINDINGS Brain: Age-related cerebral atrophy with mild chronic small vessel ischemic disease. Recently identified signal changes involving the left parieto-occipital region not visible by CT. No acute intracranial hemorrhage. No acute large vessel territory infarct. No mass lesion or midline shift. No hydrocephalus or extra-axial fluid collection. Vascular: No hyperdense vessel. Scattered vascular calcifications noted within the carotid siphons. Skull: Scalp soft tissues and calvarium within normal limits. Sinuses: Scattered mucosal thickening noted within the ethmoidal air cells. Small air-fluid level noted within the right sphenoid sinus. Paranasal sinuses are otherwise clear. No mastoid effusion. Orbits: Globes and orbital soft tissues within normal limits. Review of the MIP images confirms the above findings CTA NECK FINDINGS Aortic arch: Visualized aortic arch within normal limits for caliber with normal 3 vessel morphology. No hemodynamically significant stenosis about the origin the great vessels. Right carotid system: Right common and internal carotid arteries widely patent without stenosis, dissection or occlusion. Left carotid system: Left common and internal carotid arteries widely  patent without stenosis, dissection or occlusion. Vertebral arteries: Both vertebral arteries arise from subclavian arteries. No proximal subclavian artery stenosis. Moderate narrowing of the left V2 segment at the level of C3 related to an extrinsic compression from adjacent uncovertebral disease. Vertebral arteries otherwise widely patent without stenosis, dissection or occlusion. Skeleton: No acute osseous finding. No discrete or worrisome osseous lesions. Hyperostosis frontalis interna noted. Other neck: No other acute soft tissue abnormality within the neck. No mass or adenopathy. Upper chest: Which lies upper chest demonstrates no acute finding. Review of the MIP images confirms the above findings CTA HEAD FINDINGS Anterior circulation: Both internal carotid arteries widely patent to the  termini without stenosis. A1 segments widely patent. Normal anterior communicating artery complex. Both anterior cerebral arteries widely patent to their distal aspects without stenosis. No M1 stenosis or occlusion. Normal MCA bifurcations. Distal MCA branches well perfused and symmetric. Posterior circulation: Both V4 segments patent to the vertebrobasilar junction without stenosis. Neither PICA origin well visualized. Basilar widely patent to its distal aspect without stenosis. Superior cerebellar arteries patent bilaterally. Both PCAs primarily supplied via the basilar and are well perfused to there distal aspects. Venous sinuses: Patent allowing for timing contrast bolus. Anatomic variants: None significant.  No intracranial aneurysm. Review of the MIP images confirms the above findings IMPRESSION: CT HEAD IMPRESSION: 1. No acute intracranial abnormality. Recently identified signal changes at the left parieto-occipital region not visible by CT. 2. Age-related cerebral atrophy with mild chronic small vessel ischemic disease. CTA HEAD AND NECK IMPRESSION: Negative CTA of the head and neck. No large vessel occlusion,  hemodynamically significant stenosis, or other acute vascular abnormality. Electronically Signed   By: Jeannine Boga M.D.   On: 11/21/2020 23:22   CT Chest W Contrast  Result Date: 11/17/2020 CLINICAL DATA:  Fever, altered mental status.  Nausea and vomiting. EXAM: CT CHEST, ABDOMEN, AND PELVIS WITH CONTRAST TECHNIQUE: Multidetector CT imaging of the chest, abdomen and pelvis was performed following the standard protocol during bolus administration of intravenous contrast. CONTRAST:  167m OMNIPAQUE IOHEXOL 300 MG/ML  SOLN COMPARISON:  CT abdomen pelvis 12/20/2019, CT angiography chest 12/24/2019 FINDINGS: CT CHEST FINDINGS Cardiovascular: Normal heart size. No significant pericardial effusion. The thoracic aorta is normal in caliber. Mild atherosclerotic plaque of the thoracic aorta. The main pulmonary artery measures at the upper limits of normal: 3.2 cm. Mediastinum/Nodes: No enlarged mediastinal, hilar, or axillary lymph nodes. Thyroid gland, trachea, and esophagus demonstrate no significant findings. Lungs/Pleura: Right lower lobe atelectasis with persistent elevated right hemidiaphragm. Limited evaluation for pulmonary nodule due to respiratory motion artifact. No pulmonary mass. No focal consolidation. No pleural effusion. No pneumothorax. Musculoskeletal: No chest wall abnormality. No suspicious lytic or blastic osseous lesions. No acute displaced fracture. Multilevel degenerative changes of the spine. CT ABDOMEN PELVIS FINDINGS Hepatobiliary: No focal liver abnormality. Gallstone noted within the gallbladder lumen. No gallbladder wall thickening or pericholecystic fluid. No biliary dilatation. Pancreas: No focal lesion. Normal pancreatic contour. No surrounding inflammatory changes. No main pancreatic ductal dilatation. Spleen: Normal in size without focal abnormality. Adrenals/Urinary Tract: No adrenal nodule bilaterally. Bilateral kidneys enhance symmetrically. Exophytic hypodense 2.2 cm lesion  within the right kidney likely represent simple renal cysts. Slightly increased density likely due to streak artifact at this level. No hydronephrosis. No hydroureter. The urinary bladder is unremarkable. On delayed imaging, there is no urothelial wall thickening and there are no filling defects in the opacified portions of the bilateral collecting systems or ureters. Stomach/Bowel: Stomach is within normal limits. No evidence of bowel wall thickening or dilatation. 8 cm stool ball within the rectum. The appendix not definitely identified. Vascular/Lymphatic: No abdominal aorta or iliac aneurysm. Mild atherosclerotic plaque of the aorta and its branches. No abdominal, pelvic, or inguinal lymphadenopathy. Reproductive: Difficult to measure grossly stable uterine fibroids. No adnexal lesion. Other: No intraperitoneal free fluid. No intraperitoneal free gas. No organized fluid collection. Musculoskeletal: Tiny fat containing left inguinal hernia. No suspicious lytic or blastic osseous lesions. No acute displaced fracture. Multilevel degenerative changes of the spine. IMPRESSION: 1. Main pulmonary artery measures at the upper limits of normal. Correlate with pulmonary hypertension. 2. Otherwise no acute intrathoracic abnormality. 3.  Cholelithiasis with no CT findings of acute cholecystitis. 4. An 8 cm stool ball within the rectum with no associated findings suggest of stercoral colitis. 5. 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 Electronically Signed   By: Iven Finn M.D.   On: 11/17/2020 20:07   MR BRAIN W WO CONTRAST  Result Date: 11/19/2020 CLINICAL DATA:  Mental status change.  Encephalopathy EXAM: MRI HEAD WITHOUT AND WITH CONTRAST TECHNIQUE: Multiplanar, multiecho pulse sequences of the brain and surrounding structures were obtained without and with intravenous contrast. CONTRAST:  7.53m GADAVIST GADOBUTROL 1 MMOL/ML IV SOLN COMPARISON:  CT head 11/17/2020.  MRI head 12/24/2019 FINDINGS: Brain: Patchy  areas of hyperintensity in the left occipital pole extending into the left posterior parietal lobe best seen on FLAIR. This involves primarily the white matter but some areas of cortex. This area does not show restricted diffusion or susceptibility. This area was normal on the prior MRI 2021. No abnormal enhancement. Moderate atrophy. Mild white matter changes consistent with chronic microvascular ischemia. Diffusion-weighted imaging negative for acute infarct. No hemorrhage or mass lesion. Vascular: Normal arterial flow voids Skull and upper cervical spine: Negative Sinuses/Orbits: Mild mucosal edema paranasal sinuses. Negative orbit Other: None IMPRESSION: Ill-defined patchy hyperintensity in the left occipital parietal lobe without restricted diffusion or abnormal enhancement. Differential diagnosis includes late subacute infarct, encephalitis. Tumor not considered likely. Correlate with symptoms. Electronically Signed   By: CFranchot GalloM.D.   On: 11/19/2020 15:24   CT Abdomen Pelvis W Contrast  Result Date: 11/17/2020 CLINICAL DATA:  Fever, altered mental status.  Nausea and vomiting. EXAM: CT CHEST, ABDOMEN, AND PELVIS WITH CONTRAST TECHNIQUE: Multidetector CT imaging of the chest, abdomen and pelvis was performed following the standard protocol during bolus administration of intravenous contrast. CONTRAST:  1071mOMNIPAQUE IOHEXOL 300 MG/ML  SOLN COMPARISON:  CT abdomen pelvis 12/20/2019, CT angiography chest 12/24/2019 FINDINGS: CT CHEST FINDINGS Cardiovascular: Normal heart size. No significant pericardial effusion. The thoracic aorta is normal in caliber. Mild atherosclerotic plaque of the thoracic aorta. The main pulmonary artery measures at the upper limits of normal: 3.2 cm. Mediastinum/Nodes: No enlarged mediastinal, hilar, or axillary lymph nodes. Thyroid gland, trachea, and esophagus demonstrate no significant findings. Lungs/Pleura: Right lower lobe atelectasis with persistent elevated right  hemidiaphragm. Limited evaluation for pulmonary nodule due to respiratory motion artifact. No pulmonary mass. No focal consolidation. No pleural effusion. No pneumothorax. Musculoskeletal: No chest wall abnormality. No suspicious lytic or blastic osseous lesions. No acute displaced fracture. Multilevel degenerative changes of the spine. CT ABDOMEN PELVIS FINDINGS Hepatobiliary: No focal liver abnormality. Gallstone noted within the gallbladder lumen. No gallbladder wall thickening or pericholecystic fluid. No biliary dilatation. Pancreas: No focal lesion. Normal pancreatic contour. No surrounding inflammatory changes. No main pancreatic ductal dilatation. Spleen: Normal in size without focal abnormality. Adrenals/Urinary Tract: No adrenal nodule bilaterally. Bilateral kidneys enhance symmetrically. Exophytic hypodense 2.2 cm lesion within the right kidney likely represent simple renal cysts. Slightly increased density likely due to streak artifact at this level. No hydronephrosis. No hydroureter. The urinary bladder is unremarkable. On delayed imaging, there is no urothelial wall thickening and there are no filling defects in the opacified portions of the bilateral collecting systems or ureters. Stomach/Bowel: Stomach is within normal limits. No evidence of bowel wall thickening or dilatation. 8 cm stool ball within the rectum. The appendix not definitely identified. Vascular/Lymphatic: No abdominal aorta or iliac aneurysm. Mild atherosclerotic plaque of the aorta and its branches. No abdominal, pelvic, or inguinal lymphadenopathy. Reproductive: Difficult  to measure grossly stable uterine fibroids. No adnexal lesion. Other: No intraperitoneal free fluid. No intraperitoneal free gas. No organized fluid collection. Musculoskeletal: Tiny fat containing left inguinal hernia. No suspicious lytic or blastic osseous lesions. No acute displaced fracture. Multilevel degenerative changes of the spine. IMPRESSION: 1. Main  pulmonary artery measures at the upper limits of normal. Correlate with pulmonary hypertension. 2. Otherwise no acute intrathoracic abnormality. 3. Cholelithiasis with no CT findings of acute cholecystitis. 4. An 8 cm stool ball within the rectum with no associated findings suggest of stercoral colitis. 5. 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 Electronically Signed   By: Iven Finn M.D.   On: 11/17/2020 20:07   IR Fluoro Guide CV Line Right  Result Date: 11/25/2020 INDICATION: 75 year old female with dementia and infection requiring intravenous antibiotics. She has repeatedly pulled out every IV that has been placed. She requires durable venous access that is well secured. Therefore, she presents for placement of a tunneled central venous catheter. EXAM: IR ULTRASOUND GUIDANCE VASC ACCESS RIGHT; IR RIGHT FLUORO GUIDE CV LINE MEDICATIONS: None ANESTHESIA/SEDATION: None FLUOROSCOPY TIME:  Fluoroscopy Time: 0 minutes 12 seconds (5 mGy). COMPLICATIONS: None immediate. PROCEDURE: Informed written consent was obtained from the patient after a thorough discussion of the procedural risks, benefits and alternatives. All questions were addressed. Maximal Sterile Barrier Technique was utilized including caps, mask, sterile gowns, sterile gloves, sterile drape, hand hygiene and skin antiseptic. A timeout was performed prior to the initiation of the procedure. The right internal jugular vein was interrogated with ultrasound and found to be widely patent. An image was obtained and stored for the medical record. Local anesthesia was attained by infiltration with 1% lidocaine. A small dermatotomy was made. Under real-time sonographic guidance, the vessel was punctured with a 21 gauge micropuncture needle. Using standard technique, the initial micro needle was exchanged over a 0.018 micro wire for a peel-away sheath. A suitable skin exit site inferior to the clavicle was selected. Local anesthesia was again attained by  infiltration with 1% lidocaine. A small dermatotomy was made. The tunneled central venous catheter was then tunneled from the skin exit site to the dermatotomy overlying the venous access site. The catheter was cut to 22 cm (14 cm intravascular length) and advanced through the peel-away sheath. An image was obtained for the medical record documenting the tip of the catheter in the upper right atrium. The catheter was then capped and flushed. The catheter was secured to the skin at 3 locations using 0 Prolene suture. The dermatotomy over the venous access site was closed with Dermabond. Sterile bandages were placed. IMPRESSION: Successful placement of a power injectable dual lumen tunneled central venous catheter via the right internal jugular vein. The catheter tip is in the upper right atrium and the catheter is ready for immediate use. Electronically Signed   By: Jacqulynn Cadet M.D.   On: 11/25/2020 17:07   IR US Guide Vasc Access Right  Result Date: 11/25/2020 INDICATION: 75 year old female with dementia and infection requiring intravenous antibiotics. She has repeatedly pulled out every IV that has been placed. She requires durable venous access that is well secured. Therefore, she presents for placement of a tunneled central venous catheter. EXAM: IR ULTRASOUND GUIDANCE VASC ACCESS RIGHT; IR RIGHT FLUORO GUIDE CV LINE MEDICATIONS: None ANESTHESIA/SEDATION: None FLUOROSCOPY TIME:  Fluoroscopy Time: 0 minutes 12 seconds (5 mGy). COMPLICATIONS: None immediate. PROCEDURE: Informed written consent was obtained from the patient after a thorough discussion of the procedural risks, benefits and alternatives.  All questions were addressed. Maximal Sterile Barrier Technique was utilized including caps, mask, sterile gowns, sterile gloves, sterile drape, hand hygiene and skin antiseptic. A timeout was performed prior to the initiation of the procedure. The right internal jugular vein was interrogated with ultrasound  and found to be widely patent. An image was obtained and stored for the medical record. Local anesthesia was attained by infiltration with 1% lidocaine. A small dermatotomy was made. Under real-time sonographic guidance, the vessel was punctured with a 21 gauge micropuncture needle. Using standard technique, the initial micro needle was exchanged over a 0.018 micro wire for a peel-away sheath. A suitable skin exit site inferior to the clavicle was selected. Local anesthesia was again attained by infiltration with 1% lidocaine. A small dermatotomy was made. The tunneled central venous catheter was then tunneled from the skin exit site to the dermatotomy overlying the venous access site. The catheter was cut to 22 cm (14 cm intravascular length) and advanced through the peel-away sheath. An image was obtained for the medical record documenting the tip of the catheter in the upper right atrium. The catheter was then capped and flushed. The catheter was secured to the skin at 3 locations using 0 Prolene suture. The dermatotomy over the venous access site was closed with Dermabond. Sterile bandages were placed. IMPRESSION: Successful placement of a power injectable dual lumen tunneled central venous catheter via the right internal jugular vein. The catheter tip is in the upper right atrium and the catheter is ready for immediate use. Electronically Signed   By: Jacqulynn Cadet M.D.   On: 11/25/2020 17:07   DG Chest Port 1 View  Result Date: 11/27/2020 CLINICAL DATA:  Shortness of breath EXAM: PORTABLE CHEST 1 VIEW COMPARISON:  May 05, 2020 FINDINGS: The heart size and mediastinal contours are stable. Right central venous line is identified with distal tip in the superior vena cava. There is chronic elevation of right hemidiaphragm. Mild atelectasis of right lung base is noted. The left lung is clear. The visualized skeletal structures are unremarkable. IMPRESSION: Mild atelectasis of right lung base.  Electronically Signed   By: Abelardo Diesel M.D.   On: 11/27/2020 08:21   EEG adult  Result Date: 11/21/2020 Plancher, Baron Sane, MD     11/21/2020  3:50 PM PROCEDURE: EEG routine 22 minutes with video study  DATES OF TEST: 11/21/2020  REASON FOR TEST: acute encephalopathy  AED/Sedative MEDICATIONS: n/a  TECHNIQUE: This is an 18 channel digital EEG recording using the standard international 10/20 system of electrode placement with one channel EKG recording. Pt was awake during this recording.  FINDINGS: Background rhythm: symmetric diffuse 4-8 Hz, mostly myogenic artifact. Amplitude :  low Continuity: continuous Breach effect:  NO Variability: YES Reactivity: YES Rhythmic delta activity: NO Periodic discharges: NO Sporadic epileptiform discharges: NO Electrographic/electroclinical seizures: NO  Limited EKG reveals no abnormalities.   IMPRESSION: This is an abnormal study due to - 1. Moderate to mild Generalized slowing is a non-specific finding consistent with a generalized disturbance of cerebral functioning including toxic, metabolic, or structural abnormalities that are multi-focal or diffuse. 2. No definite epileptiform discharges or electrographic seizures noted.    ECHOCARDIOGRAM COMPLETE  Result Date: 11/19/2020    ECHOCARDIOGRAM REPORT   Patient Name:   Becky Hendricks Date of Exam: 11/19/2020 Medical Rec #:  [id number]      Height:       66.0 in Accession #:    [id number]     Weight:  195.2 lb Date of Birth:  1945/09/27       BSA:          1.979 m Patient Age:    86 years       BP:           145/76 mmHg Patient Gender: F              HR:           75 bpm. Exam Location:  Inpatient Procedure: 2D Echo Indications:    bacteremia  History:        Patient has prior history of Echocardiogram examinations, most                 recent 12/23/2019. CHF, Arrythmias:Atrial Fibrillation,                 Signs/Symptoms:Bacteremia; Risk Factors:Diabetes.  Sonographer:    Johny Chess Referring Phys: [id number]  LATIF [facility]  Sonographer Comments: Image acquisition challenging due to uncooperative patient and Image acquisition challenging due to patient body habitus. IMPRESSIONS  1. Limited study due to poor acoustic windows. Left ventricular ejection fraction, by estimation, is 60 to 65%. The left ventricle has normal function. The left ventricle has no regional wall motion abnormalities. There is mild concentric left ventricular hypertrophy. Left ventricular diastolic parameters are consistent with Grade I diastolic dysfunction (impaired relaxation).  2. Right ventricular systolic function is mildly reduced. The right ventricular size is mildly-to-moderately enlarged.  3. The mitral valve is normal in structure. Trivial mitral valve regurgitation. No evidence of mitral stenosis.  4. The aortic valve is tricuspid. There is mild calcification of the aortic valve. There is mild thickening of the aortic valve. Aortic valve regurgitation is not visualized. Mild aortic valve sclerosis is present, with no evidence of aortic valve stenosis.  5. No valvular vegetations visualized on surface echo. Pending clinical suspicion for infective endocarditis, can consider TEE for further evaluation. Comparison(s): Compared to prior TTE on 71/24/58, the RV systolic function appears mildly improved. Otherwise there is no significant change. Conclusion(s)/Recommendation(s): No evidence of valvular vegetations on this transthoracic echocardiogram. Would recommend a transesophageal echocardiogram to exclude infective endocarditis if clinically indicated. FINDINGS  Left Ventricle: Limited study due to poor acoustic windows. Left ventricular ejection fraction, by estimation, is 60 to 65%. The left ventricle has normal function. The left ventricle has no regional wall motion abnormalities. Definity contrast agent was given IV to delineate the left ventricular endocardial borders. The left ventricular internal cavity size was normal in size. There  is mild concentric left ventricular hypertrophy. Left ventricular diastolic parameters are consistent with Grade I diastolic dysfunction (impaired relaxation). Right Ventricle: The right ventricular size is mildly-to-moderately enlarged. Right vetricular wall thickness was not well visualized. Right ventricular systolic function is mildly reduced. Left Atrium: Left atrial size was normal in size. Right Atrium: Right atrial size was normal in size. Pericardium: There is no evidence of pericardial effusion. Mitral Valve: The mitral valve is normal in structure. There is mild thickening of the mitral valve leaflet(s). There is mild calcification of the mitral valve leaflet(s). Trivial mitral valve regurgitation. No evidence of mitral valve stenosis. Tricuspid Valve: The tricuspid valve is normal in structure. Tricuspid valve regurgitation is trivial. Aortic Valve: The aortic valve is tricuspid. There is mild calcification of the aortic valve. There is mild thickening of the aortic valve. Aortic valve regurgitation is not visualized. Mild aortic valve sclerosis is present, with no evidence of aortic valve stenosis. Pulmonic  Valve: The pulmonic valve was not well visualized. Pulmonic valve regurgitation is not visualized. Aorta: The aortic root and ascending aorta are structurally normal, with no evidence of dilitation. IAS/Shunts: No atrial level shunt detected by color flow Doppler.  LEFT VENTRICLE PLAX 2D LVIDd:         4.26 cm LVIDs:         2.71 cm LV PW:         1.17 cm LV IVS:        1.06 cm LVOT diam:     1.80 cm LVOT Area:     2.54 cm  RIGHT VENTRICLE RV S prime:     9.90 cm/s LEFT ATRIUM         Index LA diam:    3.80 cm 1.92 cm/m   AORTA Ao Root diam: 2.90 cm Ao Asc diam:  3.40 cm  SHUNTS Systemic Diam: 1.80 cm Gwyndolyn Kaufman MD Electronically signed by Gwyndolyn Kaufman MD Signature Date/Time: 11/19/2020/5:00:20 PM    Final    [place] EKG SITE RITE  Result Date: 11/23/2020 If Site Rite image not attached,  placement could not be confirmed due to current cardiac rhythm.  DG FL GUIDED LUMBAR PUNCTURE  Result Date: 11/18/2020 CLINICAL DATA:  Encephalopathy.  Fever.  Rule out meningitis EXAM: DIAGNOSTIC LUMBAR PUNCTURE UNDER FLUOROSCOPIC GUIDANCE COMPARISON:  CT abdomen pelvis 11/17/2020 FLUOROSCOPY TIME:  Fluoroscopy Time:  0 minutes 48 seconds Radiation Exposure Index (if provided by the fluoroscopic device): Number of Acquired Spot Images: 1 PROCEDURE: Patient was sedated prior to the study. The patient was uncooperative. Informed consent was obtained from the patient's brother by telephone prior to the procedure, including potential complications of headache, allergy, and pain. With the patient prone, the lower back was prepped with Betadine. 1% Lidocaine was used for local anesthesia. Lumbar puncture was performed at the L2-3 level using a 20 gauge needle with return of clear CSF with an opening pressure of 13 cm water. Six ml of CSF were obtained for laboratory studies. The patient tolerated the procedure well and there were no apparent complications. IMPRESSION: Successful lumbar puncture.  Clear CSF. Electronically Signed   By: Franchot Gallo M.D.   On: 11/18/2020 14:20

## 2020-11-29 NOTE — Progress Notes (Signed)
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 emergency department from her skilled nursing facility due to poor oral intake and altered mental status. So far LP, CT scan chest abdomen pelvis nonacute, MRI with questionable occipital lobe changes.   OT comments  Returned for second session as pt adamantly requesting to return to bed immediately. Assisted RN with transfer back to bed with pt much more agitated this session in comparison to previous OT session. Pt required MAX A +2 to rise from low recliner, and MAXA  +2 to return to supine. Continue to recommend SNF, will follow acutely per POC.   Follow Up Recommendations  SNF;Supervision/Assistance - 24 hour    Equipment Recommendations  None recommended by OT    Recommendations for Other Services      Precautions / Restrictions Precautions Precautions: Fall Precaution Comments: easily agitated, behavioral Restrictions Weight Bearing Restrictions: No       Mobility Bed Mobility Overal bed mobility: Needs Assistance      Sit to supine: Max assist;+2 for physical assistance   General bed mobility comments: pt able to assist with lowering trunk to supine but needed MAX A +2 to elevate BLEs back to bed    Transfers Overall transfer level: Needs assistance Equipment used: Ambulation equipment used Transfers: Sit to/from Stand Sit to Stand: Max assist;+2 physical assistance         General transfer comment: MAX A +2 to rise from recliner, pt reports that her arm is too weak to now reach to stedy ( even though pt was able to position RUE on stedy with assist last  session). MAX A +2 to rise from low chair and shift hips anteriorly to lower stedy seat.    Balance Overall balance assessment: Needs assistance Sitting-balance support: Bilateral upper extremity supported;No upper extremity supported Sitting balance-Leahy Scale: Poor Sitting balance - Comments: R and posterior lean in sitting, mod A for static sitting balance d/t fatigue Postural control: Posterior lean;Right lateral lean Standing balance support: Bilateral upper extremity supported;During functional activity Standing balance-Leahy Scale: Poor Standing balance comment: BUE support and mOD A +2 to maintain standing                           ADL either performed or assessed with clinical judgement   ADL Overall ADL's : Needs assistance/impaired   Eating/Feeding Details (indicate cue type and reason): issued pt built up foam for self feeding tasks                     Toilet Transfer: +2 for physical assistance;Maximal assistance;Total assistance Toilet Transfer Details (indicate cue type and reason): sit<>stand to stedy; total A to pivot via stedy         Functional mobility during ADLs: +2 for physical assistance;Maximal assistance (sit<>stand to stedy) General ADL Comments: returned for second session as pt adamantly requesting to get back in bed ( pt had been up 30 mins), assisted RN with transfer back to bed     Vision       Perception     Praxis      Cognition Arousal/Alertness: Awake/alert  Behavior During Therapy: Restless;Agitated Overall Cognitive Status: History of cognitive impairments - at baseline                                 General Comments: pt very agitated when COTA returned very upset that she had sat in the chair too long even though COTA exited pt room at 1535 and returned to get her back in bed at 1615 ( encouraged pt to sit up for 1 hour). Pt agitated cursing at [facility] and [employer] fundraiser.        Exercises Other Exercises Other  Exercises: educated pt on completed self ROM with RUE, elevating trunk off back of chair with BUEs positioned on arm rests of chair, leg lifts, ankle pumps, quad and glute sets   Shoulder Instructions       General Comments applied palm guard to pts R hand    Pertinent Vitals/ Pain       Pain Assessment: Faces Faces Pain Scale: Hurts little more Pain Location: buttock Pain Descriptors / Indicators: Sore Pain Intervention(s): Monitored during session;Repositioned  Home Living                                          Prior Functioning/Environment              Frequency  Min 2X/week        Progress Toward Goals  OT Goals(current goals can now be found in the care plan section)  Progress towards OT goals: Progressing toward goals  Acute Rehab OT Goals Patient Stated Goal: to get back in bed Time For Goal Achievement: 12/06/20 Potential to Achieve Goals: Fair  Plan Discharge plan remains appropriate;Frequency remains appropriate    Co-evaluation                 AM-PAC OT "6 Clicks" Daily Activity     Outcome Measure   Help from another person eating meals?: A Little Help from another person taking care of personal grooming?: A Little Help from another person toileting, which includes using toliet, bedpan, or urinal?: A Lot Help from another person bathing (including washing, rinsing, drying)?: A Lot Help from another person to put on and taking off regular upper body clothing?: A Lot Help from another person to put on and taking off regular lower body clothing?: Total 6 Click Score: 13    End of Session Equipment Utilized During Treatment: Gait belt;Other (comment) (stedy)  OT Visit Diagnosis: Other abnormalities of gait and mobility (R26.89);Muscle weakness (generalized) (M62.81)   Activity Tolerance Treatment limited secondary to agitation   Patient Left in bed;with call bell/phone within reach;with bed alarm set;with nursing/sitter  in room   Nurse Communication Mobility status        Time: 4970-2637 OT Time Calculation (min): 20 min  Charges: OT General Charges $OT Visit: 1 Visit OT Treatments $Therapeutic Activity: 8-22 mins  Lenor Derrick., COTA/L Acute Rehabilitation Services [phone]    Barron Schmid 11/29/2020, 5:18 PM

## 2020-11-29 NOTE — Progress Notes (Signed)
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 So far LP, CT scan chest abdomen pelvis nonacute, MRI with questionable occipital lobe changes.   OT comments  Pt making steady progress towards OT goals this session. Pt received in bed eager to mobilize OOB. Pt continues to present with decreased activity tolerance, R sided weakness and impaired balance. Utilized stedy for sit<>stand with pt able to stand with MOD A +2 needing assist to place RUE on stedy. Educated pt on exercises that can be completed from chair per pt request and issued pt built up foam for utensils and ADL items to be used with dominant RUE, pt very appreciative. Encouraged pt to sit up in recliner ~ 1 hour. Pt would continue to benefit from skilled occupational therapy while admitted and after d/c to address the below listed limitations in order to improve overall functional mobility and facilitate independence with BADL participation. DC plan remains appropriate, will follow acutely per POC.     Follow Up Recommendations  SNF;Supervision/Assistance - 24 hour    Equipment Recommendations  None recommended by OT    Recommendations for Other Services      Precautions / Restrictions Precautions Precautions: Fall Precaution Comments: easily agitated, behavioral Restrictions Weight Bearing Restrictions: No       Mobility Bed Mobility Overal bed mobility: Needs Assistance Bed Mobility: Supine to Sit     Supine to sit: +2 for safety/equipment;HOB  elevated;Max assist     General bed mobility comments: pt able to transition to EOB to pts R side with MAX A +2. Pt needing assist to fully progress BLEs to EOB and needed assist to elevate trunk into sitting.    Transfers Overall transfer level: Needs assistance Equipment used: Ambulation equipment used Transfers: Sit to/from Stand Sit to Stand: +2 physical assistance;Mod assist;From elevated surface         General transfer comment: MOD A +2 to rise into standing needing assist to position RUE on stedy, MOD A+2 to shift hips anteriorly enough to lower seat of stedy    Balance Overall balance assessment: Needs assistance Sitting-balance support: Bilateral upper extremity supported;No upper extremity supported Sitting balance-Leahy Scale: Poor Sitting balance - Comments: R and posterior lean in sitting, mod A initially for static sitting but progressing to short period of pt needing only MIN A Postural control: Posterior lean;Right lateral lean Standing balance support: Bilateral upper extremity supported;During functional activity Standing balance-Leahy Scale: Poor Standing balance comment: BUE support and mOD A +2 to maintain standing                           ADL either performed or assessed with clinical judgement   ADL Overall ADL's : Needs assistance/impaired   Eating/Feeding Details (indicate cue type and reason): issued pt built up foam for self feeding tasks                     Toilet Transfer: Moderate assistance;+2 for physical assistance Toilet Transfer Details (indicate cue type and reason): sit<>stand to stedy  Functional mobility during ADLs: Moderate assistance;+2 for physical assistance (sit<>stand to stedy) General ADL Comments: pt continues to present with R sided weakness, decreased activity tolerance and impaired balance     Vision       Perception     Praxis      Cognition Arousal/Alertness: Awake/alert Behavior  During Therapy: WFL for tasks assessed/performed Overall Cognitive Status: History of cognitive impairments - at baseline                                 General Comments: pt very pleasant this session, appreciative of session and education        Exercises Other Exercises Other Exercises: educated pt on completed self ROM with RUE, elevating trunk off back of chair with BUEs positioned on arm rests of chair, leg lifts, ankle pumps, quad and glute sets   Shoulder Instructions       General Comments applied palm guard to pts R hand    Pertinent Vitals/ Pain       Pain Assessment: Faces Faces Pain Scale: Hurts a little bit Pain Location: R hand Pain Descriptors / Indicators: Sore Pain Intervention(s): Monitored during session;Repositioned;Other (comment) (applied palm guard at end of session)  Home Living                                          Prior Functioning/Environment              Frequency  Min 2X/week        Progress Toward Goals  OT Goals(current goals can now be found in the care plan section)  Progress towards OT goals: Progressing toward goals  Acute Rehab OT Goals Patient Stated Goal: to go home and do her exercises Time For Goal Achievement: 12/06/20 Potential to Achieve Goals: Fair  Plan Discharge plan remains appropriate;Frequency remains appropriate    Co-evaluation                 AM-PAC OT "6 Clicks" Daily Activity     Outcome Measure   Help from another person eating meals?: A Little Help from another person taking care of personal grooming?: A Little Help from another person toileting, which includes using toliet, bedpan, or urinal?: A Lot Help from another person bathing (including washing, rinsing, drying)?: A Lot Help from another person to put on and taking off regular upper body clothing?: A Lot Help from another person to put on and taking off regular lower body clothing?: Total 6 Click  Score: 13    End of Session Equipment Utilized During Treatment: Gait belt;Other (comment) (stedy)  OT Visit Diagnosis: Other abnormalities of gait and mobility (R26.89);Muscle weakness (generalized) (M62.81)   Activity Tolerance Patient tolerated treatment well   Patient Left in chair;with call bell/phone within reach;with chair alarm set   Nurse Communication Mobility status;Other (comment) (+2 back to bed with stedy; let pt sit up for one hour)        Time: 8546-2703 OT Time Calculation (min): 27 min  Charges: OT General Charges $OT Visit: 1 Visit  Lenor Derrick., COTA/L Acute Rehabilitation Services [phone]    Barron Schmid 11/29/2020, 5:07 PM

## 2020-11-30 DIAGNOSIS — F3111 Bipolar disorder, current episode manic without psychotic features, mild: Secondary | ICD-10-CM | POA: Diagnosis not present

## 2020-11-30 DIAGNOSIS — F3160 Bipolar disorder, current episode mixed, unspecified: Secondary | ICD-10-CM | POA: Diagnosis not present

## 2020-11-30 DIAGNOSIS — G9341 Metabolic encephalopathy: Secondary | ICD-10-CM | POA: Diagnosis not present

## 2020-11-30 DIAGNOSIS — I480 Paroxysmal atrial fibrillation: Secondary | ICD-10-CM | POA: Diagnosis not present

## 2020-11-30 LAB — CBC WITH DIFFERENTIAL/PLATELET
Abs Immature Granulocytes: 0.3 10*3/uL — ABNORMAL HIGH (ref 0.00–0.07)
Basophils Absolute: 0.1 10*3/uL (ref 0.0–0.1)
Basophils Relative: 1 %
Eosinophils Absolute: 0.9 10*3/uL — ABNORMAL HIGH (ref 0.0–0.5)
Eosinophils Relative: 8 %
HCT: 33.9 % — ABNORMAL LOW (ref 36.0–46.0)
Hemoglobin: 10.7 g/dL — ABNORMAL LOW (ref 12.0–15.0)
Immature Granulocytes: 3 %
Lymphocytes Relative: 36 %
Lymphs Abs: 4.1 10*3/uL — ABNORMAL HIGH (ref 0.7–4.0)
MCH: 28.8 pg (ref 26.0–34.0)
MCHC: 31.6 g/dL (ref 30.0–36.0)
MCV: 91.4 fL (ref 80.0–100.0)
Monocytes Absolute: 1.1 10*3/uL — ABNORMAL HIGH (ref 0.1–1.0)
Monocytes Relative: 10 %
Neutro Abs: 4.9 10*3/uL (ref 1.7–7.7)
Neutrophils Relative %: 42 %
Platelets: 201 10*3/uL (ref 150–400)
RBC: 3.71 MIL/uL — ABNORMAL LOW (ref 3.87–5.11)
RDW: 14.5 % (ref 11.5–15.5)
WBC: 11.4 10*3/uL — ABNORMAL HIGH (ref 4.0–10.5)
nRBC: 0 % (ref 0.0–0.2)

## 2020-11-30 LAB — C-REACTIVE PROTEIN: CRP: 0.7 mg/dL (ref <1.0)

## 2020-11-30 LAB — COMPREHENSIVE METABOLIC PANEL
ALT: 11 U/L (ref 0–44)
AST: 14 U/L — ABNORMAL LOW (ref 15–41)
Albumin: 2.5 g/dL — ABNORMAL LOW (ref 3.5–5.0)
Alkaline Phosphatase: 43 U/L (ref 38–126)
Anion gap: 6 (ref 5–15)
BUN: 23 mg/dL (ref 8–23)
CO2: 34 mmol/L — ABNORMAL HIGH (ref 22–32)
Calcium: 8.9 mg/dL (ref 8.9–10.3)
Chloride: 99 mmol/L (ref 98–111)
Creatinine, Ser: 0.54 mg/dL (ref 0.44–1.00)
GFR, Estimated: >60 mL/min (ref >60)
Glucose, Bld: 124 mg/dL — ABNORMAL HIGH (ref 70–99)
Potassium: 4.6 mmol/L (ref 3.5–5.1)
Sodium: 139 mmol/L (ref 135–145)
Total Bilirubin: 0.4 mg/dL (ref 0.3–1.2)
Total Protein: 5.4 g/dL — ABNORMAL LOW (ref 6.5–8.1)

## 2020-11-30 LAB — GLUCOSE, CAPILLARY
Glucose-Capillary: 125 mg/dL — ABNORMAL HIGH (ref 70–99)
Glucose-Capillary: 265 mg/dL — ABNORMAL HIGH (ref 70–99)
Glucose-Capillary: 248 mg/dL — ABNORMAL HIGH (ref 70–99)

## 2020-11-30 LAB — MAGNESIUM: Magnesium: 2 mg/dL (ref 1.7–2.4)

## 2020-11-30 LAB — PROCALCITONIN: Procalcitonin: <0.1 ng/mL

## 2020-11-30 LAB — BRAIN NATRIURETIC PEPTIDE: B Natriuretic Peptide: 48.4 pg/mL (ref 0.0–100.0)

## 2020-11-30 MED ORDER — INSULIN ASPART 100 UNIT/ML ~~LOC~~ SOLN
2.0000 [IU] | Freq: Three times a day (TID) | SUBCUTANEOUS | Status: DC
  Administered 2020-11-30 – 2020-12-02 (×7): 2 [IU] via SUBCUTANEOUS

## 2020-11-30 NOTE — Plan of Care (Signed)
  Problem: Coping: Goal: Level of anxiety will decrease Outcome: Progressing   Problem: Safety: Goal: Ability to remain free from injury will improve Outcome: Progressing   Problem: Health Behavior/Discharge Planning: Goal: Ability to manage health-related needs will improve Outcome: Progressing   Problem: Clinical Measurements: Goal: Diagnostic test results will improve Outcome: Progressing   Problem: Activity: Goal: Risk for activity intolerance will decrease Outcome: Progressing   Problem: Nutrition: Goal: Adequate nutrition will be maintained Outcome: Progressing   Problem: Ischemic Stroke/TIA Tissue Perfusion: Goal: Complications of ischemic stroke/TIA will be minimized Outcome: Progressing   

## 2020-11-30 NOTE — Progress Notes (Signed)
PROGRESS NOTE    Becky Hendricks  NWG:[id number] DOB: 1946-04-19 DOA: 11/17/2020 PCP: Roderic Scarce, MD    Chief Complaint  Patient presents with  . ivc  . Aggressive Behavior    Brief Narrative:  The patient is a 75 year old female with a past medical history significant for but not limited to history of diastolic congestive heart failure with an EF of 65 to 70% with grade 2 diastolic dysfunction, bipolar disorder, hypertension, proximal atrial fibrillation on anticoagulation with Eliquis, chronic osteomyelitis of the coccyx with a stage IV sacral wound which has epithelialized, diabetes mellitus type 2, hyperlipidemia, history of seizure disorder, history of cardiac arrest 12/2019 as well as other comorbidities who presented to [facility] ED from her skilled nursing facility due to poor intake as well as acute encephalopathy which started at SNF and is gradually getting worse, however on arrival to the ER she was febrile, initial work-up suggested staph aureus 1 out of 2 sets of blood culture positive, ID and urology were consulted.  So far LP, CT scan chest abdomen pelvis nonacute, MRI with questionable occipital lobe changes.    Assessment & Plan:   Principal Problem:   Bipolar affective disorder, currently manic, mild (HCC) Active Problems:   Acute metabolic encephalopathy   Severe sepsis (HCC)   AF (paroxysmal atrial fibrillation) (HCC)   Sacral wound   Chronic diastolic CHF (congestive heart failure) (HCC)   Bipolar disorder (HCC)   SIRS (systemic inflammatory response syndrome) (HCC)   Nausea and vomiting   Mixed diabetic hyperlipidemia associated with type 2 diabetes mellitus (HCC)   Type 2 diabetes mellitus with hyperglycemia, with long-term current use of insulin (HCC)   Constipation   Encephalopathy   MRSA bacteremia     Acute toxic encephalopathy probably sec to MRSA Bacteremia in the setting of psychosis and bipolar disorder.  MRI shows possible left  occipital parietal lobe changes from PRES? Neurology on board and appreciate recommendations.  EEG is negative.  TTE is stable.     Sepsis in the setting of MRSA bacteremia  Currently on daptomycin. , stop date is 12/20/20/  Sepsis physiology resolved.    Bipolar disorder  Psychiatry following,  On depakote, risperidone.  Pt alert and answering simple questions. No agitation today.     History of sacral osteomyelitis. With healed sacral decub ulcer.     Hyperlipidemia:  Continue with statin.   Body mass index is 31.31 kg/m. OBESITY  Type 2 dm non insulin dependent.  CBG (last 3)  Recent Labs    11/29/20 2334 11/30/20 0606 11/30/20 1221  GLUCAP 210* 125* 265*   Added novolog 2 units TIDAC.  A1C IS 6.1    Paroxysmal atrail fib.  Rate controlled CHA2DS2-VASc score is about 3 On Coreg for rate control.  Currently on Eliquis for anticoagulation.   Urinary retention Resolved.   Chronic diastolic heart failure:  She appears compensated.     Red man syndrome  Improved after vancomycin was stopped.    Prolonged Qtc:  Resolved.     DVT prophylaxis: eliquis Code Status: full code.  Family Communication: none at bedside.  Disposition:   Status is: Inpatient  Remains inpatient appropriate because:IV treatments appropriate due to intensity of illness or inability to take PO   Dispo: The patient is from: Home              Anticipated d/c is to: SNF              Patient  currently is not medically stable to d/c.   Difficult to place patient No       Consultants:   Neurology  Psychiatry.      Procedures: none.   Antimicrobials:  Antibiotics Given (last 72 hours)    Date/Time Action Medication Dose Rate   11/27/20 2338 New Bag/Given   DAPTOmycin (CUBICIN) 700 mg in sodium chloride 0.9 % IVPB 700 mg 128 mL/hr   11/28/20 2100 New Bag/Given   DAPTOmycin (CUBICIN) 700 mg in sodium chloride 0.9 % IVPB 700 mg 128 mL/hr   11/29/20 2011 New  Bag/Given   DAPTOmycin (CUBICIN) 700 mg in sodium chloride 0.9 % IVPB 700 mg 128 mL/hr      Subjective: Denies any chest pain or sob, no nausea, vomiting or abdominal pain.   Objective: Vitals:   11/29/20 2334 11/30/20 0407 11/30/20 0700 11/30/20 0909  BP: (!) 126/56 (!) 142/63 (!) 145/57 (!) 128/52  Pulse: 81 63 90 89  Resp: 17 17 18    Temp: 98.3 F (36.8 C) 97.8 F (36.6 C) 98.3 F (36.8 C)   TempSrc: Oral Oral Oral   SpO2: 95% 93% 96%   Weight:      Height:        Intake/Output Summary (Last 24 hours) at 11/30/2020 1211 Last data filed at 11/30/2020 0920 Gross per 24 hour  Intake --  Output 800 ml  Net -800 ml   Filed Weights   11/20/20 0900  Weight: 88 kg    Examination:  General exam: Appears calm and comfortable  Respiratory system: Clear to auscultation. Respiratory effort normal. Cardiovascular system: S1 & S2 heard, RRR.  No pedal edema. Gastrointestinal system: Abdomen is nondistended, soft and nontender.  Normal bowel sounds heard. Central nervous system: Alert, able to answer simple questions.  Extremities: no pedal edema.  Skin: no ulcers.  Psychiatry:  Tangential thoughts no agitation .     Data Reviewed: I have personally reviewed following labs and imaging studies  CBC: Recent Labs  Lab 11/26/20 0216 11/27/20 0103 11/28/20 0500 11/29/20 0505 11/30/20 0500  WBC 8.1 11.5* 10.0 10.5 11.4*  NEUTROABS 3.7 5.5 4.1 4.5 4.9  HGB 11.6* 11.1* 11.5* 10.7* 10.7*  HCT 37.0 35.7* 36.4 33.8* 33.9*  MCV 91.8 93.0 91.5 91.6 91.4  PLT 218 212 208 204 201    Basic Metabolic Panel: Recent Labs  Lab 11/26/20 0216 11/27/20 0103 11/28/20 0500 11/29/20 0505 11/30/20 0500  NA 140 140 141 138 139  K 4.3 4.9 4.6 4.4 4.6  CL 105 102 100 99 99  CO2 32 33* 34* 35* 34*  GLUCOSE 150* 153* 131* 123* 124*  BUN 9 15 13 16 23   CREATININE 0.76 0.81 0.62 0.60 0.54  CALCIUM 8.2* 8.5* 8.9 8.4* 8.9  MG 2.1 2.0 1.9 2.0 2.0    GFR: Estimated Creatinine  Clearance: 71 mL/min (by C-G formula based on SCr of 0.54 mg/dL).  Liver Function Tests: Recent Labs  Lab 11/26/20 0216 11/27/20 0103 11/28/20 0500 11/29/20 0505 11/30/20 0500  AST 13* 9* 11* 11* 14*  ALT 14 12 11 11 11   ALKPHOS 46 49 46 43 43  BILITOT 0.6 0.3 0.4 0.6 0.4  PROT 4.7* 4.8* 4.9* 4.8* 5.4*  ALBUMIN 2.3* 2.3* 2.4* 2.3* 2.5*    CBG: Recent Labs  Lab 11/29/20 0608 11/29/20 1134 11/29/20 1721 11/29/20 2334 11/30/20 0606  GLUCAP 157* 165* 216* 210* 125*     No results found for this or any previous visit (from the past  240 hour(s)).       Radiology Studies: No results found.      Scheduled Meds: . amLODipine  10 mg Oral Daily  . apixaban  5 mg Oral BID  . atorvastatin  10 mg Oral Daily  . carvedilol  6.25 mg Oral BID WC  . divalproex  500 mg Oral Q12H  . docusate sodium  200 mg Oral BID  . insulin aspart  0-15 Units Subcutaneous Q6H  . multivitamin with minerals  1 tablet Oral Daily  . pantoprazole  40 mg Oral Daily  . polyethylene glycol  17 g Oral BID  . risperiDONE  2 mg Oral BID  . tamsulosin  0.4 mg Oral Daily   Continuous Infusions: . sodium chloride 250 mL (11/29/20 2010)  . DAPTOmycin (CUBICIN)  IV 700 mg (11/29/20 2011)     LOS: 12 days       Kathlen Mody, MD [facility]   To contact the attending provider between 7A-7P or the covering provider during after hours 7P-7A, please log into the web site [email or web address] and access using universal [place] password for that web site. If you do not have the password, please call the hospital operator.  11/30/2020, 12:11 PM

## 2020-11-30 NOTE — Plan of Care (Signed)
  Problem: Coping: Goal: Level of anxiety will decrease Outcome: Progressing   Problem: Safety: Goal: Ability to remain free from injury will improve Outcome: Progressing   Problem: Health Behavior/Discharge Planning: Goal: Ability to manage health-related needs will improve Outcome: Not Progressing   Problem: Clinical Measurements: Goal: Diagnostic test results will improve Outcome: Progressing   Problem: Activity: Goal: Risk for activity intolerance will decrease Outcome: Progressing   Problem: Nutrition: Goal: Adequate nutrition will be maintained Outcome: Progressing

## 2020-12-01 DIAGNOSIS — F3111 Bipolar disorder, current episode manic without psychotic features, mild: Secondary | ICD-10-CM | POA: Diagnosis not present

## 2020-12-01 DIAGNOSIS — F3160 Bipolar disorder, current episode mixed, unspecified: Secondary | ICD-10-CM | POA: Diagnosis not present

## 2020-12-01 DIAGNOSIS — Z515 Encounter for palliative care: Secondary | ICD-10-CM

## 2020-12-01 DIAGNOSIS — Z7189 Other specified counseling: Secondary | ICD-10-CM

## 2020-12-01 DIAGNOSIS — I480 Paroxysmal atrial fibrillation: Secondary | ICD-10-CM | POA: Diagnosis not present

## 2020-12-01 DIAGNOSIS — G9341 Metabolic encephalopathy: Secondary | ICD-10-CM | POA: Diagnosis not present

## 2020-12-01 LAB — GLUCOSE, CAPILLARY
Glucose-Capillary: 217 mg/dL — ABNORMAL HIGH (ref 70–99)
Glucose-Capillary: 128 mg/dL — ABNORMAL HIGH (ref 70–99)
Glucose-Capillary: 360 mg/dL — ABNORMAL HIGH (ref 70–99)
Glucose-Capillary: 153 mg/dL — ABNORMAL HIGH (ref 70–99)
Glucose-Capillary: 213 mg/dL — ABNORMAL HIGH (ref 70–99)

## 2020-12-01 LAB — PROCALCITONIN: Procalcitonin: <0.1 ng/mL

## 2020-12-01 NOTE — Progress Notes (Signed)
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 So far LP, CT scan chest abdomen pelvis nonacute, MRI with questionable occipital lobe changes.    PT Comments    Patient lethargic on arrival, per RN patient had just received Ativan for agitation. Patient refused mobility this session. Encouraged patient to participate and educated on importance of mobility. Patient continued to refuse. Patient willing to participate in bed level exercises. Able to follow 1 step commands with increased time and when willing to do so. Continue to recommend SNF for ongoing Physical Therapy.       Follow Up Recommendations  SNF     Equipment Recommendations  Hospital bed    Recommendations for Other Services       Precautions / Restrictions Precautions Precautions: Fall Precaution Comments: easily agitated, behavioral Restrictions Weight Bearing Restrictions: No    Mobility  Bed Mobility               General bed mobility comments: refused mobility stating she needs a day to rest but then she will begin to do more.    Transfers                    Ambulation/Gait                 Stairs             Wheelchair Mobility    Modified Rankin (Stroke Patients Only)       Balance                                            Cognition Arousal/Alertness: Lethargic;Suspect due to medications Behavior During Therapy: Flat affect;Agitated Overall Cognitive Status: History of cognitive  impairments - at baseline                                 General Comments: lethargic, keeping eyes closed majority of session. Per RN, patient had just received Ativan due to agitation. Patient following 1 step commands with increased time and when willing      Exercises General Exercises - Lower Extremity Ankle Circles/Pumps: Both;5 reps;Supine Heel Slides: Both;5 reps;Supine Straight Leg Raises: Both;5 reps;Supine    General Comments        Pertinent Vitals/Pain Pain Assessment: Faces Faces Pain Scale: Hurts a little bit Pain Location: R hand with movement Pain Descriptors / Indicators: Grimacing Pain Intervention(s): Monitored during session    Home Living                      Prior Function            PT Goals (current goals can now be found in the care plan section) Acute Rehab PT Goals Patient Stated Goal: to do nothing PT Goal Formulation: Patient unable to participate in goal setting Time For Goal Achievement: 12/04/20 Potential to Achieve Goals: Fair  Progress towards PT goals: Not progressing toward goals - comment    Frequency    Min 2X/week      PT Plan Current plan remains appropriate    Co-evaluation              AM-PAC PT "6 Clicks" Mobility   Outcome Measure  Help needed turning from your back to your side while in a flat bed without using bedrails?: A Lot Help needed moving from lying on your back to sitting on the side of a flat bed without using bedrails?: A Lot Help needed moving to and from a bed to a chair (including a wheelchair)?: Total Help needed standing up from a chair using your arms (e.g., wheelchair or bedside chair)?: Total Help needed to walk in hospital room?: Total Help needed climbing 3-5 steps with a railing? : Total 6 Click Score: 8    End of Session   Activity Tolerance: Patient limited by lethargy Patient left: in bed;with call bell/phone within reach;with bed alarm set Nurse  Communication: Mobility status PT Visit Diagnosis: Muscle weakness (generalized) (M62.81);Difficulty in walking, not elsewhere classified (R26.2);Other symptoms and signs involving the nervous system (R29.898);Hemiplegia and hemiparesis     Time: 6144-3154 PT Time Calculation (min) (ACUTE ONLY): 10 min  Charges:  $Therapeutic Exercise: 8-22 mins                     Cayci Mcnabb A. Dan Humphreys PT, DPT Acute Rehabilitation Services Pager [phone] Office [phone]    Viviann Spare 12/01/2020, 1:08 PM

## 2020-12-01 NOTE — Progress Notes (Signed)
Had a good night.  Very pleasant and happy.  No outbursts and no complaints.  Did often refuse to reposition because she likes to be on her back.  Was reminded that she does need to reposition so her skin does not breakdown again.

## 2020-12-01 NOTE — Plan of Care (Signed)
  Problem: Coping: Goal: Level of anxiety will decrease 12/01/2020 1905 by Merrilee Seashore, RN Outcome: Not Progressing 12/01/2020 1904 by Merrilee Seashore, RN Outcome: Not Progressing   Problem: Safety: Goal: Ability to remain free from injury will improve 12/01/2020 1905 by Merrilee Seashore, RN Outcome: Progressing 12/01/2020 1904 by Merrilee Seashore, RN Outcome: Not Progressing   Problem: Health Behavior/Discharge Planning: Goal: Ability to manage health-related needs will improve 12/01/2020 1905 by Merrilee Seashore, RN Outcome: Not Progressing 12/01/2020 1904 by Merrilee Seashore, RN Outcome: Not Progressing   Problem: Clinical Measurements: Goal: Diagnostic test results will improve 12/01/2020 1905 by Merrilee Seashore, RN Outcome: Progressing 12/01/2020 1904 by Merrilee Seashore, RN Outcome: Not Progressing   Problem: Activity: Goal: Risk for activity intolerance will decrease 12/01/2020 1905 by Merrilee Seashore, RN Outcome: Progressing 12/01/2020 1904 by Merrilee Seashore, RN Outcome: Not Progressing   Problem: Nutrition: Goal: Adequate nutrition will be maintained 12/01/2020 1905 by Merrilee Seashore, RN Outcome: Progressing 12/01/2020 1904 by Merrilee Seashore, RN Outcome: Not Progressing   Problem: Ischemic Stroke/TIA Tissue Perfusion: Goal: Complications of ischemic stroke/TIA will be minimized Outcome: Not Progressing

## 2020-12-01 NOTE — Progress Notes (Signed)
Patient started the morning out pleasantly, calm and answering questions appropriately. Became increasingly upset at the late arrival of her breakfast tray, growing more agitated and paranoid at RN, refusing to take her PO medications. MD has been made aware, came to see patient, no new orders. RN will attempt to encourage pt to take medications.

## 2020-12-01 NOTE — Consult Note (Signed)
Consultation Note Date: 12/01/2020   Patient Name: Becky Hendricks  DOB: 09/25/1945  MRN: [id number]  Age / Sex: 75 y.o., female  PCP: Roderic ScarceHartman, Lisa, MD Referring Physician: Kathlen ModyAkula, Vijaya, MD  Reason for Consultation: Establishing goals of care and Psychosocial/spiritual support  HPI/Patient Profile: 75 y.o. 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 So far LP, CT scan chest abdomen pelvis nonacute, MRI with questionable occipital lobe changes.  Patient with overall failure to thrive.  Significant underlying psychiatric issues.   Psychiatry involved.  Without capacity at this time.   Family face treatment option decisions, advanced directive decisions and anticipatory care needs.  Clinical Assessment and Goals of Care:   This NP Lorinda CreedMary Tobenna Needs reviewed medical records, received report from team, assessed the patient and then spoke to her brother Bernestine AmassGeorge Aumiller by phone to discuss current medical situation and goals of care.   Concept of Palliative Care was introduced as specialized medical care for people and their families living with serious illness.  If focuses on providing relief from the symptoms and stress of a serious illness.  The goal is to improve quality of life for both the patient and the family.   Values and goals of care important to patient and family were attempted to be elicited.  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 He tells me that Dr. French Anaracy  a psychiatrist in [place] number [phone] is the psychiatric professional that knows her best.  I will try to contact today  On top of her psychiatric issues the patient has had medical issues dating back December 2020 when she was diagnosed with COVID and [facility] before being [facility] rehab in February 2021.  At some point she had a stage IV sacral wound that at this time has healed.  Her overall health and well waxes and wanes directly related to her willingness to take her prescribed medications.  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  Her brother describes her as very intelligent, she has several masters degrees, and speaks 3 languages.   A  discussion was had today regarding advanced directives.  Concepts specific to code status, artifical feeding and hydration, continued IV antibiotics and rehospitalization was had.  The difference between a aggressive medical intervention path  and a palliative comfort care path for this patient at this time was had.     Greggory StallionGeorge understands that these decisions will ultimately be his to make but at this time, he wishes to proceed with current treatment plan and hope for improvement.   Questions and concerns addressed.  Family  encouraged to call with questions or concerns.     PMT will continue to support holistically.      No documented healthcare power of attorney.   Her brother Bernestine AmassGeorge Shiel is her next of kin and acts as decision maker in the event that the patient cannot make her own decisions.  SUMMARY OF RECOMMENDATIONS    Code Status/Advance Care Planning:  Full code     Symptom Management:   Continue with current medications as prescribed, Risperdal and Depakote  Patient has been evaluated multiple times by  psychiatry    Palliative Prophylaxis:   Aspiration, Bowel Regimen, Delirium Protocol, Frequent Pain Assessment and Oral Care  Additional Recommendations (Limitations, Scope, Preferences):  Full Scope Treatment  Psycho-social/Spiritual:   Desire for further Chaplaincy support:no   Prognosis:   Unable to determine  Discharge Planning: Skilled Nursing Facility for rehab with Palliative care service follow-up      Primary Diagnoses: Present on Admission: . SIRS (systemic inflammatory response syndrome) (HCC) . Nausea and vomiting . Acute metabolic encephalopathy . Sacral wound . AF (paroxysmal atrial fibrillation) (HCC) . Mixed diabetic hyperlipidemia associated with type 2 diabetes mellitus (HCC) . Bipolar disorder (HCC) . Constipation . Severe sepsis (HCC) . Bipolar affective disorder, currently manic, mild (HCC)   I have reviewed the medical record, interviewed the patient and family, and examined the patient. The following aspects are pertinent.  Past Medical History:  Diagnosis Date  . Atrial fibrillation (HCC)   . Bipolar disorder (HCC)   . Bipolar disorder (HCC) 04/27/2020  . Clostridium difficile diarrhea 08/02/2019  . Dehydration   . Essential hypertension   . Fall 02/11/2020  . Morbid obesity (HCC)   . Osteomyelitis (HCC) 12/03/2019  . Pneumonia due to COVID-19 virus 09/15/2019  . Type 2 diabetes mellitus (HCC)   . Weakness    Social History   Socioeconomic History  . Marital status: Single    Spouse name: Not on file  . Number of children: Not on file  . Years of education: Not on file  . Highest education level: Not on file  Occupational History  . Not on file  Tobacco Use  . Smoking status: Never Smoker  . Smokeless tobacco: Never Used  Vaping Use  . Vaping Use: Unknown  Substance and Sexual Activity  . Alcohol use: Not Currently  . Drug use: Not Currently  . Sexual activity: Not Currently    Birth control/protection: Post-menopausal   Other Topics Concern  . Not on file  Social History Narrative  . Not on file   Social Determinants of Health   Financial Resource Strain: Not on file  Food Insecurity: Not on file  Transportation Needs: Not on file  Physical Activity: Not on file  Stress: Not on file  Social Connections: Not on file   Family History  Family history unknown: Yes   Scheduled Meds: . amLODipine  10 mg Oral Daily  . apixaban  5 mg Oral BID  . atorvastatin  10 mg Oral Daily  . carvedilol  6.25 mg Oral BID WC  . divalproex  500 mg Oral Q12H  . docusate sodium  200 mg Oral BID  . insulin aspart  0-15 Units Subcutaneous Q6H  . insulin aspart  2 Units Subcutaneous TID WC  . multivitamin with minerals  1 tablet Oral Daily  . pantoprazole  40 mg Oral Daily  . polyethylene glycol  17 g Oral BID  . risperiDONE  2 mg Oral BID  . tamsulosin  0.4 mg Oral Daily   Continuous Infusions: . sodium chloride 250 mL (11/29/20 2010)  . DAPTOmycin (CUBICIN)  IV 700 mg (11/30/20 2058)   PRN Meds:.sodium chloride, acetaminophen **OR** acetaminophen, hydrALAZINE, lidocaine (PF), LORazepam, [DISCONTINUED] ondansetron **OR** ondansetron (ZOFRAN) IV Medications Prior to Admission:  Prior to Admission medications   Medication  Sig Start Date End Date Taking? Authorizing Provider  Amino Acids-Protein Hydrolys (FEEDING SUPPLEMENT, PRO-STAT SUGAR FREE 64,) LIQD Take 30 mLs by mouth 2 (two) times daily. 01/05/20  Yes Regalado, Belkys A, MD  apixaban (ELIQUIS) 5 MG TABS tablet Take 5 mg by mouth 2 (two) times daily.   Yes [provider]  atorvastatin (LIPITOR) 10 MG tablet Take 10 mg by mouth daily.   Yes [provider]  calcium carbonate (TUMS - DOSED IN MG ELEMENTAL CALCIUM) 500 MG chewable tablet Chew 500 mg by mouth daily.   Yes [provider]  carvedilol (COREG) 3.125 MG tablet Take 3.125 mg by mouth 2 (two) times daily. 11/01/20  Yes [provider]  Cholecalciferol (VITAMIN D) 125  MCG (5000 UT) CAPS Take 10,000 Units by mouth once a week. Wednesday   Yes [provider]  cloNIDine HCl (KAPVAY) 0.1 MG TB12 ER tablet Take 0.1 mg by mouth 2 (two) times daily. 11/02/20  Yes [provider]  diphenhydrAMINE (BENADRYL) 2 % cream Apply 1 application topically 3 (three) times daily as needed for itching.   Yes [provider]  divalproex (DEPAKOTE) 500 MG DR tablet Take 500 mg by mouth 2 (two) times daily.   Yes [provider]  docusate sodium (COLACE) 100 MG capsule Take 1 capsule (100 mg total) by mouth 2 (two) times daily as needed for mild constipation. 01/05/20  Yes Regalado, Belkys A, MD  furosemide (LASIX) 20 MG tablet Take 20 mg by mouth every other day. 11/07/20  Yes [provider]  gabapentin (NEURONTIN) 100 MG capsule Take 100 mg by mouth 2 (two) times daily. 11/01/20  Yes [provider]  insulin glargine (LANTUS) 100 UNIT/ML injection Inject 4 Units into the skin at bedtime. Notify PEC for CBG less than 100 or greater than 250.   Yes [provider]  Ipratropium-Albuterol (COMBIVENT RESPIMAT) 20-100 MCG/ACT AERS respimat Inhale 1 puff into the lungs every 12 (twelve) hours.   Yes [provider]  losartan (COZAAR) 25 MG tablet Take 25 mg by mouth daily.   Yes [provider]  Multiple Vitamin (MULTIVITAMIN WITH MINERALS) TABS tablet Take 1 tablet by mouth daily.   Yes [provider]  pantoprazole (PROTONIX) 40 MG tablet Take 1 tablet (40 mg total) by mouth 2 (two) times daily. Patient taking differently: Take 40 mg by mouth daily. 01/05/20  Yes Regalado, Belkys A, MD  QUEtiapine (SEROQUEL) 50 MG tablet Take 50 mg by mouth at bedtime. Take with 200mg  10/28/20  Yes [provider]  sennosides-docusate sodium (SENOKOT-S) 8.6-50 MG tablet Take 1 tablet by mouth daily.   Yes [provider]  SEROQUEL 100 MG tablet Take 200 mg by mouth at bedtime. Take with 50mg  11/07/20  Yes  [provider]  sitaGLIPtin-metformin (JANUMET) 50-1000 MG tablet Take 1 tablet by mouth 2 (two) times daily with a meal.   Yes [provider]  Ensure Max Protein (ENSURE MAX PROTEIN) LIQD Take 330 mLs (11 oz total) by mouth 2 (two) times daily. Patient not taking: Reported on 11/17/2020 01/27/20   01/17/2021, MD   Allergies  Allergen Reactions  . Chlorhexidine Gluconate Itching  . Vancomycin     Vancomycin infusion related reaction- May need Vanc to be given at a slower rate   . Acetazolamide Er Rash   Review of Systems  Unable to perform ROS: Mental status change    Physical Exam Constitutional:      Appearance:  She is overweight. She is ill-appearing.  Cardiovascular:     Rate and Rhythm: Normal rate.  Neurological:     Mental Status: She is lethargic.     Vital Signs: BP (!) 153/75 (BP Location: Left Arm)   Pulse 84   Temp 98.1 F (36.7 C) (Oral)   Resp 18   Ht 5\' 6"  (1.676 m)   Wt 88 kg   LMP  (LMP Unknown)   SpO2 99%   BMI 31.31 kg/m  Pain Scale: 0-10   Pain Score: 0-No pain   SpO2: SpO2: 99 % O2 Device:SpO2: 99 % O2 Flow Rate: .O2 Flow Rate (L/min): 2 L/min  IO: Intake/output summary:   Intake/Output Summary (Last 24 hours) at 12/01/2020 0857 Last data filed at 12/01/2020 0408 Gross per 24 hour  Intake --  Output 2900 ml  Net -2900 ml    LBM: Last BM Date: 11/30/20 Baseline Weight: Weight: 88 kg Most recent weight: Weight: 88 kg     Palliative Assessment/Data: 30 %   Discussed with Dr 12/02/20.    Plan is for patient to return to SNF in the morning.  Time In: 0900 Time Out: 1010 Time Total: 70 minutes Greater than 50%  of this time was spent counseling and coordinating care related to the above assessment and plan.  Signed by: Blake Divine, NP   Please contact Palliative Medicine Team phone at [phone] for questions and concerns.  For individual provider: See [phone]

## 2020-12-01 NOTE — Progress Notes (Signed)
PRN BP medication given, see MAR

## 2020-12-01 NOTE — Progress Notes (Signed)
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 So far LP, CT scan chest abdomen pelvis nonacute, MRI with questionable occipital lobe changes.   OT comments  Patient continues to make minimal progress towards goals in skilled OT session. Patient's session encompassed gentle PROM in order to don palm protector splint on R hand. Pt adamantly refusing assistance in session to date, stating "I will move on the weekend" with eyes closed and smirking at therapists suggestions. Pt agreeing to gentle stretching, initially refusing palm protector, but agreeable at end of session. Pt completing minimal bed mobility at end of session for respositioning with all needs met. Therapy will continue to follow.    Follow Up Recommendations  SNF;Supervision/Assistance - 24 hour    Equipment Recommendations  None recommended by OT    Recommendations for Other Services      Precautions / Restrictions Precautions Precautions: Fall Precaution Comments: easily agitated, behavioral Restrictions Weight Bearing Restrictions: No       Mobility Bed Mobility               General bed mobility comments: refused mobility stating she needs a day to rest but then she will begin to do more.    Transfers                      Balance                                           ADL either performed or assessed with clinical judgement   ADL                                       Functional mobility  during ADLs: +2 for physical assistance;Maximal assistance General ADL Comments: minimally participatory in session, noted to have recieved atavan per RN as pt was increasingly combative and agitated, session focus on gentle PROM of RUE, bed mobility, and assessment of palm protector splint     Vision       Perception     Praxis      Cognition Arousal/Alertness: Lethargic;Suspect due to medications Behavior During Therapy: Flat affect;Agitated Overall Cognitive Status: History of cognitive impairments - at baseline                                 General Comments: lethargic, keeping eyes closed majority of session. Per RN, patient had just received Ativan due to agitation. Patient following 1 step commands with increased time and when willing        Exercises Exercises: General Lower Extremity General Exercises - Lower Extremity Ankle Circles/Pumps: Both;5 reps;Supine Heel Slides: Both;5 reps;Supine Straight Leg Raises: Both;5 reps;Supine   Shoulder Instructions       General Comments      Pertinent Vitals/ Pain  Pain Assessment: Faces Faces Pain Scale: Hurts a little bit Pain Location: R hand with movement Pain Descriptors / Indicators: Grimacing Pain Intervention(s): Monitored during session  Home Living                                          Prior Functioning/Environment              Frequency  Min 2X/week        Progress Toward Goals  OT Goals(current goals can now be found in the care plan section)  Progress towards OT goals: Progressing toward goals  Acute Rehab OT Goals Patient Stated Goal: to do nothing Time For Goal Achievement: 12/06/20 Potential to Achieve Goals: [place] Discharge plan remains appropriate;Frequency remains appropriate    Co-evaluation                 AM-PAC OT "6 Clicks" Daily Activity     Outcome Measure   Help from another person eating meals?: A Little Help from  another person taking care of personal grooming?: A Little Help from another person toileting, which includes using toliet, bedpan, or urinal?: A Lot Help from another person bathing (including washing, rinsing, drying)?: A Lot Help from another person to put on and taking off regular upper body clothing?: A Lot Help from another person to put on and taking off regular lower body clothing?: Total 6 Click Score: 13    End of Session Equipment Utilized During Treatment: Other (comment) (palm protector splint donned on R hand)  OT Visit Diagnosis: Other abnormalities of gait and mobility (R26.89);Muscle weakness (generalized) (M62.81)   Activity Tolerance Patient limited by fatigue;Other (comment) (behaviors, keeping eyes closed and smirking, minimally participatory)   Patient Left in bed;with call bell/phone within reach;with bed alarm set;with nursing/sitter in room   Nurse Communication Mobility status        Time: 2376-2831 OT Time Calculation (min): 11 min  Charges: OT General Charges $OT Visit: 1 Visit OT Treatments $Therapeutic Activity: 8-22 mins  Corinne Ports E. Glennis Borger, COTA/L Acute Rehabilitation Services [phone] [place] 12/01/2020, 3:01 PM

## 2020-12-01 NOTE — Progress Notes (Signed)
Pharmacy Antibiotic Note  Becky Hendricks is a 75 y.o. female admitted on 11/17/2020 with MRSA bacteremia in 1/4 blood cultures. Repeat blood cultures from 4/8 are no growth to date. TTE on4/9/22 was negative. Noted MRI on 11/19/20 showed hyperintensity int he left occipital parietal lobe -possible infarct  ID team was initially concerned about [place] sx with Vancomycin IV. Pharmacy consulted 11/20/20 to transition to daptomycin.   ID pharmacist noted adverse reaction with IV Vancomycin due to infusion rate intolerance and may be able to tolerate if given at slower rate (added to allergy field as intolerance).   Most recent labs from 11/30/20 WBC WNL and  afebrile. SCr is stable at <1.  Weekly CK levels, every Monday, monitoring for elevated CK level while on daptomycin.  CK = 52, within normal  on 11/28/20   Plan: Continue Daptomycin 8mg /kg IV q24h, end date 12/20/20,  to coincide with outpatient ID clinic appointmen).  Monitor weekly CK level every Monday, next due 12/05/20.    Height: 5\' 6"  (167.6 cm) Weight: 88 kg (194 lb 0.1 oz) IBW/kg (Calculated) : 59.3  Temp (24hrs), Avg:98 F (36.7 C), Min:97.6 F (36.4 C), Max:98.4 F (36.9 C)  Recent Labs  Lab 11/26/20 0216 11/27/20 0103 11/28/20 0500 11/29/20 0505 11/30/20 0500  WBC 8.1 11.5* 10.0 10.5 11.4*  CREATININE 0.76 0.81 0.62 0.60 0.54    Estimated Creatinine Clearance: 71 mL/min (by C-G formula based on SCr of 0.54 mg/dL).    Allergies  Allergen Reactions  . Chlorhexidine Gluconate Itching  . Vancomycin     Vancomycin infusion related reaction- May need Vanc to be given at a slower rate   . Acetazolamide Er Rash    Antimicrobials this admission: 4/7 CTX >>4/8 4/8 vanc >>4/9 4/8 amp >>4/8 4/10 Daptomycin >>   Microbiology results: 4/7 BCx: 1/4bottles MRSA 4/8 repeat BCx: negative final 4/7 UCx: no growth final  4/8 CSF: negative final 4/8 CSF fungal: negative final  Thank you for allowing pharmacy to be a part  of this patient's care.  6/8, RPh Clinical Pharmacist [phone] Please check AMION for all [facility] numbers 12/01/2020

## 2020-12-01 NOTE — Progress Notes (Signed)
PROGRESS NOTE    Becky Hendricks  FYB:[id number] DOB: 03/17/1946 DOA: 11/17/2020 PCP: Roderic Scarce, MD    Chief Complaint  Patient presents with  . ivc  . 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  So far LP, CT scan chest abdomen pelvis nonacute, MRI with questionable occipital lobe changes. Pt seen and examined. She continues to have periods of paranoia and clarity. No agitation. She asked me to come back and talk to her with neurologist and cardiologist.     Assessment & Plan:   Principal Problem:   Bipolar affective disorder, currently manic, mild (HCC) Active Problems:   Acute metabolic encephalopathy   Severe sepsis (HCC)   AF (paroxysmal atrial fibrillation) (HCC)   Sacral wound   Chronic diastolic CHF (congestive heart failure) (HCC)   Bipolar disorder (HCC)   SIRS (systemic inflammatory response syndrome) (HCC)   Nausea and vomiting   Mixed diabetic hyperlipidemia associated with type 2 diabetes mellitus (HCC)   Type 2 diabetes mellitus with hyperglycemia, with long-term current use of insulin (HCC)   Constipation    Encephalopathy   MRSA bacteremia     Acute toxic encephalopathy probably sec to MRSA Bacteremia in the setting of psychosis and bipolar disorder.  MRI shows possible left occipital parietal lobe changes from PRES? Neurology on board and appreciate recommendations.  EEG is negative.  TTE is stable.  Pt has periods of paranoia and clarity.    Sepsis in the setting of MRSA bacteremia  Currently on daptomycin. , stop date is 12/20/20.  Sepsis physiology resolved.    Bipolar disorder  Psychiatry following,  On depakote, risperidone. No changes in meds. Prn ativan on board.  Pt alert and answering simple questions. No agitation today.     History of sacral osteomyelitis. With healed sacral decub ulcer.     Hyperlipidemia:  Continue with statin.   Body mass index is 31.31 kg/m. OBESITY  Type 2 dm non insulin dependent.  CBG (last 3)  Recent Labs    12/01/20 0103 12/01/20 0605 12/01/20 1139  GLUCAP 217* 128* 360*   Added novolog 2 units TIDAC.  A1C IS 6.1    Paroxysmal atrail fib.  Rate controlled CHA2DS2-VASc score is about 3 On Coreg for rate control.  Currently on Eliquis for anticoagulation.   Urinary retention Resolved.   Chronic diastolic heart failure:  She appears compensated.     Red man syndrome  Improved after vancomycin was stopped.    Prolonged Qtc:  Resolved.     DVT prophylaxis: eliquis Code Status: full code.  Family Communication: none at bedside.  Disposition:   Status is: Inpatient  Remains inpatient appropriate because:Unsafe d/c plan and IV treatments appropriate due  to intensity of illness or inability to take PO   Dispo: The patient is from: Home              Anticipated d/c is to: SNF              Patient currently is medically stable to d/c.   Difficult to place patient No       Consultants:   Neurology  Psychiatry.      Procedures: none.   Antimicrobials:  Antibiotics Given (last 72 hours)     Date/Time Action Medication Dose Rate   11/28/20 2100 New Bag/Given   DAPTOmycin (CUBICIN) 700 mg in sodium chloride 0.9 % IVPB 700 mg 128 mL/hr   11/29/20 2011 New Bag/Given   DAPTOmycin (CUBICIN) 700 mg in sodium chloride 0.9 % IVPB 700 mg 128 mL/hr   11/30/20 2058 New Bag/Given   DAPTOmycin (CUBICIN) 700 mg in sodium chloride 0.9 % IVPB 700 mg 128 mL/hr      Subjective: Alert and comfortable. Requested me to come back with the neurologist and cardiologist.    Objective: Vitals:   12/01/20 0457 12/01/20 0753 12/01/20 1030 12/01/20 1140  BP: 138/79 (!) 153/75 (!) 145/78 (!) 145/61  Pulse: 63 84  84  Resp: 18 18  18   Temp: 98 F (36.7 C) 98.1 F (36.7 C)  98 F (36.7 C)  TempSrc: Oral Oral  Oral  SpO2: 95% 99%  96%  Weight:      Height:        Intake/Output Summary (Last 24 hours) at 12/01/2020 1422 Last data filed at 12/01/2020 0408 Gross per 24 hour  Intake --  Output 2100 ml  Net -2100 ml   Filed Weights   11/20/20 0900  Weight: 88 kg    Examination:  General exam: elderly lady, not in distress.  Respiratory system: air entry fair, no wheezing or rhonchi.  Cardiovascular system: S1S2, RRR, no JVD,  Gastrointestinal system: Abdomen is soft, non tender non distended, bowel sounds wnl.  Central nervous system: Alert and able to answer all questions.  Extremities: no pedal edema.  Skin: no ulcers.  Psychiatry:  Tangential thoughts no agitation .     Data Reviewed: I have personally reviewed following labs and imaging studies  CBC: Recent Labs  Lab 11/26/20 0216 11/27/20 0103 11/28/20 0500 11/29/20 0505 11/30/20 0500  WBC 8.1 11.5* 10.0 10.5 11.4*  NEUTROABS 3.7 5.5 4.1 4.5 4.9  HGB 11.6* 11.1* 11.5* 10.7* 10.7*  HCT 37.0 35.7* 36.4 33.8* 33.9*  MCV 91.8 93.0 91.5 91.6 91.4  PLT 218 212 208 204 201    Basic Metabolic Panel: Recent Labs  Lab 11/26/20 0216 11/27/20 0103 11/28/20 0500 11/29/20 0505 11/30/20 0500  NA 140 140 141 138 139  K 4.3  4.9 4.6 4.4 4.6  CL 105 102 100 99 99  CO2 32 33* 34* 35* 34*  GLUCOSE 150* 153* 131* 123* 124*  BUN 9 15 13 16 23   CREATININE 0.76 0.81 0.62 0.60 0.54  CALCIUM 8.2* 8.5* 8.9 8.4* 8.9  MG 2.1 2.0 1.9 2.0 2.0    GFR: Estimated Creatinine Clearance: 71 mL/min (by C-G formula based on SCr of 0.54 mg/dL).  Liver Function Tests: Recent Labs  Lab 11/26/20 0216 11/27/20 0103 11/28/20 0500 11/29/20 0505 11/30/20 0500  AST 13* 9* 11* 11* 14*  ALT 14 12 11 11 11   ALKPHOS 46 49 46 43 43  BILITOT 0.6 0.3 0.4 0.6 0.4  PROT 4.7* 4.8* 4.9* 4.8*  5.4*  ALBUMIN 2.3* 2.3* 2.4* 2.3* 2.5*    CBG: Recent Labs  Lab 11/30/20 1221 11/30/20 1834 12/01/20 0103 12/01/20 0605 12/01/20 1139  GLUCAP 265* 248* 217* 128* 360*     No results found for this or any previous visit (from the past 240 hour(s)).       Radiology Studies: No results found.      Scheduled Meds: . amLODipine  10 mg Oral Daily  . apixaban  5 mg Oral BID  . atorvastatin  10 mg Oral Daily  . carvedilol  6.25 mg Oral BID WC  . divalproex  500 mg Oral Q12H  . docusate sodium  200 mg Oral BID  . insulin aspart  0-15 Units Subcutaneous Q6H  . insulin aspart  2 Units Subcutaneous TID WC  . multivitamin with minerals  1 tablet Oral Daily  . pantoprazole  40 mg Oral Daily  . polyethylene glycol  17 g Oral BID  . risperiDONE  2 mg Oral BID  . tamsulosin  0.4 mg Oral Daily   Continuous Infusions: . sodium chloride 250 mL (11/29/20 2010)  . DAPTOmycin (CUBICIN)  IV 700 mg (11/30/20 0349)     LOS: 13 days       Kathlen Mody, MD [facility]   To contact the attending provider between 7A-7P or the covering provider during after hours 7P-7A, please log into the web site [email or web address] and access using universal [place] password for that web site. If you do not have the password, please call the hospital operator.  12/01/2020, 2:22 PM

## 2020-12-01 NOTE — Progress Notes (Signed)
Patient was given PRN Ativan for agitation and is asleep, unable to complete Neuro assessment at this time.

## 2020-12-02 LAB — GLUCOSE, CAPILLARY
Glucose-Capillary: 168 mg/dL — ABNORMAL HIGH (ref 70–99)
Glucose-Capillary: 210 mg/dL — ABNORMAL HIGH (ref 70–99)
Glucose-Capillary: 230 mg/dL — ABNORMAL HIGH (ref 70–99)
Glucose-Capillary: 163 mg/dL — ABNORMAL HIGH (ref 70–99)

## 2020-12-02 LAB — VITAMIN B1: Vitamin B1 (Thiamine): 143.6 nmol/L (ref 66.5–200.0)

## 2020-12-02 LAB — SARS CORONAVIRUS 2 (TAT 6-24 HRS): SARS Coronavirus 2: NEGATIVE

## 2020-12-02 MED ORDER — AMLODIPINE BESYLATE 10 MG PO TABS: 10.0000 mg | ORAL_TABLET | Freq: Every day | ORAL | 1 refills | Status: DC

## 2020-12-02 MED ORDER — POLYETHYLENE GLYCOL 3350 17 G PO PACK: 17.0000 g | PACK | Freq: Two times a day (BID) | ORAL | 0 refills | Status: DC

## 2020-12-02 MED ORDER — RISPERIDONE 2 MG PO TBDP
2.0000 mg | ORAL_TABLET | Freq: Two times a day (BID) | ORAL | 0 refills | Status: DC
Start: 2020-12-02 — End: 2021-03-29

## 2020-12-02 MED ORDER — INSULIN ASPART 100 UNIT/ML ~~LOC~~ SOLN: SUBCUTANEOUS | 11 refills | Status: DC

## 2020-12-02 MED ORDER — TAMSULOSIN HCL 0.4 MG PO CAPS: 0.4000 mg | ORAL_CAPSULE | Freq: Every day | ORAL | 1 refills | Status: DC

## 2020-12-02 MED ORDER — CARVEDILOL 6.25 MG PO TABS: 6.2500 mg | ORAL_TABLET | Freq: Two times a day (BID) | ORAL | 1 refills | Status: DC

## 2020-12-02 MED ORDER — DAPTOMYCIN IV (FOR PTA / DISCHARGE USE ONLY): 700.0000 mg | INTRAVENOUS | 0 refills | Status: AC

## 2020-12-02 NOTE — TOC Progression Note (Signed)
Transition of Care [facility]) - Progression Note    Patient Details  Name: Becky Hendricks MRN: [id number] Date of Birth: January 24, 1946  [facility] of [facility] CM/SW Contact  Baldemar Lenis, [place] Phone Number: 12/02/2020, 10:36 AM  Clinical Narrative:   CSW reached out to [facility] to ensure that they could take the patient back tomorrow. [facility] will have a bed available for the patient, so she can return. CSW to follow.    Expected Discharge Plan: Skilled Nursing Facility Barriers to Discharge: Continued Medical Work up  Expected Discharge Plan and Services Expected Discharge Plan: Skilled Nursing Facility       Living arrangements for the past 2 months: Skilled Nursing Facility                                       Social Determinants of Health (SDOH) Interventions    Readmission Risk Interventions No flowsheet data found.

## 2020-12-02 NOTE — Discharge Summary (Signed)
Physician Discharge Summary  Becky Hendricks WVP:[id number] DOB: 19-Mar-1946 DOA: 11/17/2020  PCP: [name], MD  Admit date: 11/17/2020 Discharge date: 12/02/2020  Admitted From: SNF Disposition:  SNF [facility].   Recommendations for Outpatient Follow-up:  1. Follow up with PCP in 1-2 weeks 2. Please obtain BMP/CBC in one week Please follow up with palliative care services at SNF.  Please remove the PICC line after the antibiotics are completed.  Please follow up with ID as recommended.   Discharge Condition: guarded.  CODE STATUS:full code.  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 So far LP, CT scan chest abdomen pelvis nonacute, MRI with questionable occipital lobe changes. Pt seen and examined. She continues to have periods of paranoia and clarity. No agitation.   Discharge Diagnoses:  Principal Problem:   Bipolar affective disorder, currently manic, mild (HCC) Active Problems:   Acute metabolic encephalopathy   Severe sepsis (HCC)   AF (paroxysmal atrial fibrillation) (HCC)   Sacral wound   Chronic diastolic CHF  (congestive heart failure) (HCC)   Bipolar disorder (HCC)   SIRS (systemic inflammatory response syndrome) (HCC)   Nausea and vomiting   Mixed diabetic hyperlipidemia associated with type 2 diabetes mellitus ([place])   Type 2 diabetes mellitus with hyperglycemia, with long-term current use of insulin (HCC)   Constipation   Encephalopathy   MRSA bacteremia     Acute toxic encephalopathy probably sec to MRSA Bacteremia in the setting of psychosis and bipolar disorder.  MRI shows possible left occipital parietal lobe changes from PRES? Neurology on board and appreciate recommendations.  EEG is negative.  TTE is stable.  Pt has periods of paranoia and clarity.    Sepsis in the setting of MRSA bacteremia  Currently on daptomycin. , stop date is 12/20/20.  Sepsis physiology resolved.    Bipolar disorder  Psychiatry following,  On depakote, risperidone. No changes in meds.  Pt alert and answering simple questions. No agitation today.     History of sacral osteomyelitis. With healed sacral decub ulcer.     Hyperlipidemia:  Continue with statin.   Body mass index is 31.31 kg/m. OBESITY  Type 2 dm non insulin dependent.  CBG (last 3)  Recent Labs    12/01/20 2329 12/02/20 0609 12/02/20 1125  GLUCAP 213* 168* 210*   Continue with SSI.     Paroxysmal atrail fib.  Rate controlled CHA2DS2-VASc score is about 3 On Coreg for rate control.  Currently on Eliquis for anticoagulation.   Urinary retention Resolved.   Chronic diastolic heart failure:  She appears compensated.     Red man syndrome  Improved after vancomycin was stopped.    Prolonged Qtc:  Resolved.  Discharge Instructions  Discharge Instructions    [facility] pharmacist to adjust dose for Vancomycin, Aminoglycosides and other anti-infective therapies as requested by physician.   Complete by: As directed    [facility] to provide Cath Flo 1m    Complete by: As directed    Administer for PICC line occlusion and as ordered by physician for other access device issues.   Anaphylaxis Kit: Provided to treat any anaphylactic reaction to the medication being provided to the patient if First Dose or when requested by physician   Complete by: As directed    Epinephrine 177mml vial / amp: Administer 0.36m87m0.36ml37mubcutaneously once for moderate to severe anaphylaxis, nurse to call physician and pharmacy when reaction occurs and call 911 if needed for immediate care   Diphenhydramine 50mg46mIV vial: Administer 25-50mg 44mM PRN for first dose reaction, rash, itching, mild reaction, nurse to call physician and pharmacy when reaction occurs   Sodium Chloride 0.9% NS 500ml I45mdminister if needed for hypovolemic blood pressure drop or as ordered by physician after call to physician with anaphylactic reaction   CSF culture   Complete by: As directed    Change dressing on IV access line weekly and PRN   Complete by: As directed    Diet - low sodium heart healthy   Complete by: As directed    Discharge instructions   Complete by: As directed    Please follow up with PCP in one week.   Flush IV access with Sodium Chloride 0.9% and Heparin 10 units/ml or 100 units/ml   Complete by: As directed    Gram stain (required with CSF Culture)   Complete by: As directed    Home infusion instructions - [facility]   Complete by: As directed    Instructions: Flush IV access with Sodium Chloride 0.9% and Heparin 10units/ml or 100units/ml   Change dressing on IV access line: Weekly and PRN   Instructions Cath Flo 2mg: Ad35mister for PICC Line occlusion and as ordered by physician for other access device   [facility] pharmacist to adjust dose for: Vancomycin, Aminoglycosides and other anti-infective therapies as requested by physician   Method of administration may be changed at the discretion of home infusion pharmacist based upon  assessment of the patient and/or caregiver's ability to self-administer the medication ordered   Complete by: As directed    No wound care   Complete by: As directed      Allergies as of 12/02/2020      Reactions   Chlorhexidine Gluconate Itching   Vancomycin    Vancomycin infusion related reaction- May need Vanc to be given at a slower rate    Acetazolamide Er Rash      Medication List    STOP taking these medications   cloNIDine HCl 0.1 MG Tb12 ER tablet Commonly known as: KAPVAY   Ensure Max Protein Liqd   furosemide 20 MG tablet Commonly known as: LASIX   gabapentin 100 MG capsule Commonly known as: NEURONTIN   insulin glargine 100 UNIT/ML injection Commonly known as: LANTUS   losartan 25 MG tablet Commonly known as: COZAAR   QUEtiapine 50 MG tablet Commonly known as: SEROQUEL   SEROquel 100 MG tablet Generic drug: QUEtiapine   sitaGLIPtin-metformin 50-1000 MG tablet Commonly known as: JANUMET     TAKE these medications   amLODipine 10 MG tablet Commonly known as: NORVASC Take 1 tablet (10 mg total) by mouth daily. Start taking  on: December 03, 2020   apixaban 5 MG Tabs tablet Commonly known as: ELIQUIS Take 5 mg by mouth 2 (two) times daily.   atorvastatin 10 MG tablet Commonly known as: LIPITOR Take 10 mg by mouth daily.   calcium carbonate 500 MG chewable tablet Commonly known as: TUMS - dosed in mg elemental calcium Chew 500 mg by mouth daily.   carvedilol 6.25 MG tablet Commonly known as: COREG Take 1 tablet (6.25 mg total) by mouth 2 (two) times daily with a meal. What changed:   medication strength  how much to take  when to take this   Combivent Respimat 20-100 MCG/ACT Aers respimat Generic drug: Ipratropium-Albuterol Inhale 1 puff into the lungs every 12 (twelve) hours.   daptomycin  IVPB Commonly known as: CUBICIN Inject 700 mg into the vein daily for 26 days. Indication: MRSA bacteremia  First Dose: Yes Last Day of Therapy:  12/20/20 Labs - Once weekly:  CBC/D, BMP, and CPK Labs - Every other week:  ESR and CRP Method of administration: IV Push Method of administration may be changed at the discretion of home infusion pharmacist based upon assessment of the patient and/or caregiver's ability to self-administer the medication ordered.   diphenhydrAMINE 2 % cream Commonly known as: BENADRYL Apply 1 application topically 3 (three) times daily as needed for itching.   divalproex 500 MG DR tablet Commonly known as: DEPAKOTE Take 500 mg by mouth 2 (two) times daily.   docusate sodium 100 MG capsule Commonly known as: COLACE Take 1 capsule (100 mg total) by mouth 2 (two) times daily as needed for mild constipation.   feeding supplement (PRO-STAT SUGAR FREE 64) Liqd Take 30 mLs by mouth 2 (two) times daily.   insulin aspart 100 UNIT/ML injection Commonly known as: novoLOG CBG 70 - 120: 0 units CBG 121 - 150: 2 units CBG 151 - 200: 3 units CBG 201 - 250: 5 units CBG 251 - 300: 8 units CBG 301 - 350: 11 units CBG 351 - 400: 15 units   multivitamin with minerals Tabs tablet Take 1 tablet by mouth daily.   pantoprazole 40 MG tablet Commonly known as: PROTONIX Take 1 tablet (40 mg total) by mouth 2 (two) times daily. What changed: when to take this   polyethylene glycol 17 g packet Commonly known as: MIRALAX / GLYCOLAX Take 17 g by mouth 2 (two) times daily.   risperiDONE 2 MG disintegrating tablet Commonly known as: RISPERDAL M-TABS Take 1 tablet (2 mg total) by mouth 2 (two) times daily.   sennosides-docusate sodium 8.6-50 MG tablet Commonly known as: SENOKOT-S Take 1 tablet by mouth daily.   tamsulosin 0.4 MG Caps capsule Commonly known as: FLOMAX Take 1 capsule (0.4 mg total) by mouth daily. Start taking on: December 03, 2020   Vitamin D 125 MCG (5000 UT) Caps Take 10,000 Units by mouth once a week. Wednesday            Discharge Care Instructions  (From admission, onward)          Start     Ordered   12/02/20 0000  Change dressing on IV access line weekly and PRN  (Home infusion instructions - [facility] )        12/02/20 1118          Follow-up Information    Tommy Medal, Lavell Islam, MD Follow up.   Specialty: Infectious Diseases Why: 5/10 at 9:45am. Please call to reschedule if you are not  able to make this appointment. Contact information: [address] [place] [address] [phone]              Allergies  Allergen Reactions  . Chlorhexidine Gluconate Itching  . Vancomycin     Vancomycin infusion related reaction- May need Vanc to be given at a slower rate   . Acetazolamide Er Rash    Consultations:  Palliative care .   Psychiatry.    Procedures/Studies: CT ANGIO HEAD W OR WO CONTRAST  Result Date: 11/21/2020 CLINICAL DATA:  Initial evaluation for neuro deficit, stroke suspected. EXAM: CT ANGIOGRAPHY HEAD AND NECK TECHNIQUE: Multidetector CT imaging of the head and neck was performed using the standard protocol during bolus administration of intravenous contrast. Multiplanar CT image reconstructions and MIPs were obtained to evaluate the vascular anatomy. Carotid stenosis measurements (when applicable) are obtained utilizing NASCET criteria, using the distal internal carotid diameter as the denominator. CONTRAST:  53m OMNIPAQUE IOHEXOL 350 MG/ML SOLN COMPARISON:  Prior MRI from 11/19/2020. FINDINGS: CT HEAD FINDINGS Brain: Age-related cerebral atrophy with mild chronic small vessel ischemic disease. Recently identified signal changes involving the left parieto-occipital region not visible by CT. No acute intracranial hemorrhage. No acute large vessel territory infarct. No mass lesion or midline shift. No hydrocephalus or extra-axial fluid collection. Vascular: No hyperdense vessel. Scattered vascular calcifications noted within the carotid siphons. Skull: Scalp soft tissues and calvarium within normal limits. Sinuses: Scattered  mucosal thickening noted within the ethmoidal air cells. Small air-fluid level noted within the right sphenoid sinus. Paranasal sinuses are otherwise clear. No mastoid effusion. Orbits: Globes and orbital soft tissues within normal limits. Review of the MIP images confirms the above findings CTA NECK FINDINGS Aortic arch: Visualized aortic arch within normal limits for caliber with normal 3 vessel morphology. No hemodynamically significant stenosis about the origin the great vessels. Right carotid system: Right common and internal carotid arteries widely patent without stenosis, dissection or occlusion. Left carotid system: Left common and internal carotid arteries widely patent without stenosis, dissection or occlusion. Vertebral arteries: Both vertebral arteries arise from subclavian arteries. No proximal subclavian artery stenosis. Moderate narrowing of the left V2 segment at the level of C3 related to an extrinsic compression from adjacent uncovertebral disease. Vertebral arteries otherwise widely patent without stenosis, dissection or occlusion. Skeleton: No acute osseous finding. No discrete or worrisome osseous lesions. Hyperostosis frontalis interna noted. Other neck: No other acute soft tissue abnormality within the neck. No mass or adenopathy. Upper chest: Which lies upper chest demonstrates no acute finding. Review of the MIP images confirms the above findings CTA HEAD FINDINGS Anterior circulation: Both internal carotid arteries widely patent to the termini without stenosis. A1 segments widely patent. Normal anterior communicating artery complex. Both anterior cerebral arteries widely patent to their distal aspects without stenosis. No M1 stenosis or occlusion. Normal MCA bifurcations. Distal MCA branches well perfused and symmetric. Posterior circulation: Both V4 segments patent to the vertebrobasilar junction without stenosis. Neither PICA origin well visualized. Basilar widely patent to its distal  aspect without stenosis. Superior cerebellar arteries patent bilaterally. Both PCAs primarily supplied via the basilar and are well perfused to there distal aspects. Venous sinuses: Patent allowing for timing contrast bolus. Anatomic variants: None significant.  No intracranial aneurysm. Review of the MIP images confirms the above findings IMPRESSION: CT HEAD IMPRESSION: 1. No acute intracranial abnormality. Recently identified signal changes at the left parieto-occipital region not visible by CT. 2. Age-related cerebral atrophy with mild chronic small vessel ischemic  disease. CTA HEAD AND NECK IMPRESSION: Negative CTA of the head and neck. No large vessel occlusion, hemodynamically significant stenosis, or other acute vascular abnormality. Electronically Signed   By: Jeannine Boga M.D.   On: 11/21/2020 23:22   DG Abd 1 View  Result Date: 11/27/2020 CLINICAL DATA:  Nausea EXAM: ABDOMEN - 1 VIEW COMPARISON:  None. FINDINGS: The bowel gas pattern is normal. Extensive bowel content is identified throughout colon. No radio-opaque calculi or other significant radiographic abnormality are seen. IMPRESSION: No bowel obstruction. Constipation. Electronically Signed   By: Abelardo Diesel M.D.   On: 11/27/2020 08:22   DG Abd 1 View  Result Date: 11/21/2020 CLINICAL DATA:  Nausea EXAM: ABDOMEN - 1 VIEW COMPARISON:  None. FINDINGS: There is diffuse stool throughout colon. No bowel dilatation or air-fluid level to suggest bowel obstruction. No free air. There are pelvic calcifications consistent with phleboliths. Visualized lung bases clear. IMPRESSION: Diffuse stool throughout colon. Question a degree of underlying constipation. No bowel obstruction or free air. Electronically Signed   By: Lowella Grip III M.D.   On: 11/21/2020 12:18   CT HEAD WO CONTRAST  Result Date: 11/17/2020 CLINICAL DATA:  Altered mental status. EXAM: CT HEAD WITHOUT CONTRAST TECHNIQUE: Contiguous axial images were obtained from the  base of the skull through the vertex without intravenous contrast. COMPARISON:  May 05, 2020 FINDINGS: Brain: There is mild cerebral atrophy with widening of the extra-axial spaces and ventricular dilatation. There are areas of decreased attenuation within the white matter tracts of the supratentorial brain, consistent with microvascular disease changes. Vascular: No hyperdense vessel or unexpected calcification. Skull: Normal. Negative for fracture or focal lesion. Sinuses/Orbits: Mild bilateral ethmoid sinus mucosal thickening is seen. Other: The study is limited secondary to patient motion. IMPRESSION: 1. Generalized cerebral atrophy. 2. No acute intracranial abnormality. Electronically Signed   By: Virgina Norfolk M.D.   On: 11/17/2020 17:17   CT ANGIO NECK W OR WO CONTRAST  Result Date: 11/21/2020 CLINICAL DATA:  Initial evaluation for neuro deficit, stroke suspected. EXAM: CT ANGIOGRAPHY HEAD AND NECK TECHNIQUE: Multidetector CT imaging of the head and neck was performed using the standard protocol during bolus administration of intravenous contrast. Multiplanar CT image reconstructions and MIPs were obtained to evaluate the vascular anatomy. Carotid stenosis measurements (when applicable) are obtained utilizing NASCET criteria, using the distal internal carotid diameter as the denominator. CONTRAST:  42m OMNIPAQUE IOHEXOL 350 MG/ML SOLN COMPARISON:  Prior MRI from 11/19/2020. FINDINGS: CT HEAD FINDINGS Brain: Age-related cerebral atrophy with mild chronic small vessel ischemic disease. Recently identified signal changes involving the left parieto-occipital region not visible by CT. No acute intracranial hemorrhage. No acute large vessel territory infarct. No mass lesion or midline shift. No hydrocephalus or extra-axial fluid collection. Vascular: No hyperdense vessel. Scattered vascular calcifications noted within the carotid siphons. Skull: Scalp soft tissues and calvarium within normal limits.  Sinuses: Scattered mucosal thickening noted within the ethmoidal air cells. Small air-fluid level noted within the right sphenoid sinus. Paranasal sinuses are otherwise clear. No mastoid effusion. Orbits: Globes and orbital soft tissues within normal limits. Review of the MIP images confirms the above findings CTA NECK FINDINGS Aortic arch: Visualized aortic arch within normal limits for caliber with normal 3 vessel morphology. No hemodynamically significant stenosis about the origin the great vessels. Right carotid system: Right common and internal carotid arteries widely patent without stenosis, dissection or occlusion. Left carotid system: Left common and internal carotid arteries widely patent without stenosis, dissection or occlusion.  Vertebral arteries: Both vertebral arteries arise from subclavian arteries. No proximal subclavian artery stenosis. Moderate narrowing of the left V2 segment at the level of C3 related to an extrinsic compression from adjacent uncovertebral disease. Vertebral arteries otherwise widely patent without stenosis, dissection or occlusion. Skeleton: No acute osseous finding. No discrete or worrisome osseous lesions. Hyperostosis frontalis interna noted. Other neck: No other acute soft tissue abnormality within the neck. No mass or adenopathy. Upper chest: Which lies upper chest demonstrates no acute finding. Review of the MIP images confirms the above findings CTA HEAD FINDINGS Anterior circulation: Both internal carotid arteries widely patent to the termini without stenosis. A1 segments widely patent. Normal anterior communicating artery complex. Both anterior cerebral arteries widely patent to their distal aspects without stenosis. No M1 stenosis or occlusion. Normal MCA bifurcations. Distal MCA branches well perfused and symmetric. Posterior circulation: Both V4 segments patent to the vertebrobasilar junction without stenosis. Neither PICA origin well visualized. Basilar widely  patent to its distal aspect without stenosis. Superior cerebellar arteries patent bilaterally. Both PCAs primarily supplied via the basilar and are well perfused to there distal aspects. Venous sinuses: Patent allowing for timing contrast bolus. Anatomic variants: None significant.  No intracranial aneurysm. Review of the MIP images confirms the above findings IMPRESSION: CT HEAD IMPRESSION: 1. No acute intracranial abnormality. Recently identified signal changes at the left parieto-occipital region not visible by CT. 2. Age-related cerebral atrophy with mild chronic small vessel ischemic disease. CTA HEAD AND NECK IMPRESSION: Negative CTA of the head and neck. No large vessel occlusion, hemodynamically significant stenosis, or other acute vascular abnormality. Electronically Signed   By: Jeannine Boga M.D.   On: 11/21/2020 23:22   CT Chest W Contrast  Result Date: 11/17/2020 CLINICAL DATA:  Fever, altered mental status.  Nausea and vomiting. EXAM: CT CHEST, ABDOMEN, AND PELVIS WITH CONTRAST TECHNIQUE: Multidetector CT imaging of the chest, abdomen and pelvis was performed following the standard protocol during bolus administration of intravenous contrast. CONTRAST:  157m OMNIPAQUE IOHEXOL 300 MG/ML  SOLN COMPARISON:  CT abdomen pelvis 12/20/2019, CT angiography chest 12/24/2019 FINDINGS: CT CHEST FINDINGS Cardiovascular: Normal heart size. No significant pericardial effusion. The thoracic aorta is normal in caliber. Mild atherosclerotic plaque of the thoracic aorta. The main pulmonary artery measures at the upper limits of normal: 3.2 cm. Mediastinum/Nodes: No enlarged mediastinal, hilar, or axillary lymph nodes. Thyroid gland, trachea, and esophagus demonstrate no significant findings. Lungs/Pleura: Right lower lobe atelectasis with persistent elevated right hemidiaphragm. Limited evaluation for pulmonary nodule due to respiratory motion artifact. No pulmonary mass. No focal consolidation. No pleural  effusion. No pneumothorax. Musculoskeletal: No chest wall abnormality. No suspicious lytic or blastic osseous lesions. No acute displaced fracture. Multilevel degenerative changes of the spine. CT ABDOMEN PELVIS FINDINGS Hepatobiliary: No focal liver abnormality. Gallstone noted within the gallbladder lumen. No gallbladder wall thickening or pericholecystic fluid. No biliary dilatation. Pancreas: No focal lesion. Normal pancreatic contour. No surrounding inflammatory changes. No main pancreatic ductal dilatation. Spleen: Normal in size without focal abnormality. Adrenals/Urinary Tract: No adrenal nodule bilaterally. Bilateral kidneys enhance symmetrically. Exophytic hypodense 2.2 cm lesion within the right kidney likely represent simple renal cysts. Slightly increased density likely due to streak artifact at this level. No hydronephrosis. No hydroureter. The urinary bladder is unremarkable. On delayed imaging, there is no urothelial wall thickening and there are no filling defects in the opacified portions of the bilateral collecting systems or ureters. Stomach/Bowel: Stomach is within normal limits. No evidence of bowel wall thickening  or dilatation. 8 cm stool ball within the rectum. The appendix not definitely identified. Vascular/Lymphatic: No abdominal aorta or iliac aneurysm. Mild atherosclerotic plaque of the aorta and its branches. No abdominal, pelvic, or inguinal lymphadenopathy. Reproductive: Difficult to measure grossly stable uterine fibroids. No adnexal lesion. Other: No intraperitoneal free fluid. No intraperitoneal free gas. No organized fluid collection. Musculoskeletal: Tiny fat containing left inguinal hernia. No suspicious lytic or blastic osseous lesions. No acute displaced fracture. Multilevel degenerative changes of the spine. IMPRESSION: 1. Main pulmonary artery measures at the upper limits of normal. Correlate with pulmonary hypertension. 2. Otherwise no acute intrathoracic abnormality. 3.  Cholelithiasis with no CT findings of acute cholecystitis. 4. An 8 cm stool ball within the rectum with no associated findings suggest of stercoral colitis. 5. 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 Electronically Signed   By: Iven Finn M.D.   On: 11/17/2020 20:07   MR BRAIN W WO CONTRAST  Result Date: 11/19/2020 CLINICAL DATA:  Mental status change.  Encephalopathy EXAM: MRI HEAD WITHOUT AND WITH CONTRAST TECHNIQUE: Multiplanar, multiecho pulse sequences of the brain and surrounding structures were obtained without and with intravenous contrast. CONTRAST:  7.62m GADAVIST GADOBUTROL 1 MMOL/ML IV SOLN COMPARISON:  CT head 11/17/2020.  MRI head 12/24/2019 FINDINGS: Brain: Patchy areas of hyperintensity in the left occipital pole extending into the left posterior parietal lobe best seen on FLAIR. This involves primarily the white matter but some areas of cortex. This area does not show restricted diffusion or susceptibility. This area was normal on the prior MRI 2021. No abnormal enhancement. Moderate atrophy. Mild white matter changes consistent with chronic microvascular ischemia. Diffusion-weighted imaging negative for acute infarct. No hemorrhage or mass lesion. Vascular: Normal arterial flow voids Skull and upper cervical spine: Negative Sinuses/Orbits: Mild mucosal edema paranasal sinuses. Negative orbit Other: None IMPRESSION: Ill-defined patchy hyperintensity in the left occipital parietal lobe without restricted diffusion or abnormal enhancement. Differential diagnosis includes late subacute infarct, encephalitis. Tumor not considered likely. Correlate with symptoms. Electronically Signed   By: CFranchot GalloM.D.   On: 11/19/2020 15:24   CT Abdomen Pelvis W Contrast  Result Date: 11/17/2020 CLINICAL DATA:  Fever, altered mental status.  Nausea and vomiting. EXAM: CT CHEST, ABDOMEN, AND PELVIS WITH CONTRAST TECHNIQUE: Multidetector CT imaging of the chest, abdomen and pelvis was performed  following the standard protocol during bolus administration of intravenous contrast. CONTRAST:  102mOMNIPAQUE IOHEXOL 300 MG/ML  SOLN COMPARISON:  CT abdomen pelvis 12/20/2019, CT angiography chest 12/24/2019 FINDINGS: CT CHEST FINDINGS Cardiovascular: Normal heart size. No significant pericardial effusion. The thoracic aorta is normal in caliber. Mild atherosclerotic plaque of the thoracic aorta. The main pulmonary artery measures at the upper limits of normal: 3.2 cm. Mediastinum/Nodes: No enlarged mediastinal, hilar, or axillary lymph nodes. Thyroid gland, trachea, and esophagus demonstrate no significant findings. Lungs/Pleura: Right lower lobe atelectasis with persistent elevated right hemidiaphragm. Limited evaluation for pulmonary nodule due to respiratory motion artifact. No pulmonary mass. No focal consolidation. No pleural effusion. No pneumothorax. Musculoskeletal: No chest wall abnormality. No suspicious lytic or blastic osseous lesions. No acute displaced fracture. Multilevel degenerative changes of the spine. CT ABDOMEN PELVIS FINDINGS Hepatobiliary: No focal liver abnormality. Gallstone noted within the gallbladder lumen. No gallbladder wall thickening or pericholecystic fluid. No biliary dilatation. Pancreas: No focal lesion. Normal pancreatic contour. No surrounding inflammatory changes. No main pancreatic ductal dilatation. Spleen: Normal in size without focal abnormality. Adrenals/Urinary Tract: No adrenal nodule bilaterally. Bilateral kidneys enhance symmetrically. Exophytic hypodense 2.2  cm lesion within the right kidney likely represent simple renal cysts. Slightly increased density likely due to streak artifact at this level. No hydronephrosis. No hydroureter. The urinary bladder is unremarkable. On delayed imaging, there is no urothelial wall thickening and there are no filling defects in the opacified portions of the bilateral collecting systems or ureters. Stomach/Bowel: Stomach is within  normal limits. No evidence of bowel wall thickening or dilatation. 8 cm stool ball within the rectum. The appendix not definitely identified. Vascular/Lymphatic: No abdominal aorta or iliac aneurysm. Mild atherosclerotic plaque of the aorta and its branches. No abdominal, pelvic, or inguinal lymphadenopathy. Reproductive: Difficult to measure grossly stable uterine fibroids. No adnexal lesion. Other: No intraperitoneal free fluid. No intraperitoneal free gas. No organized fluid collection. Musculoskeletal: Tiny fat containing left inguinal hernia. No suspicious lytic or blastic osseous lesions. No acute displaced fracture. Multilevel degenerative changes of the spine. IMPRESSION: 1. Main pulmonary artery measures at the upper limits of normal. Correlate with pulmonary hypertension. 2. Otherwise no acute intrathoracic abnormality. 3. Cholelithiasis with no CT findings of acute cholecystitis. 4. An 8 cm stool ball within the rectum with no associated findings suggest of stercoral colitis. 5. 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 Electronically Signed   By: Iven Finn M.D.   On: 11/17/2020 20:07   IR Fluoro Guide CV Line Right  Result Date: 11/25/2020 INDICATION: 75 year old female with dementia and infection requiring intravenous antibiotics. She has repeatedly pulled out every IV that has been placed. She requires durable venous access that is well secured. Therefore, she presents for placement of a tunneled central venous catheter. EXAM: IR ULTRASOUND GUIDANCE VASC ACCESS RIGHT; IR RIGHT FLUORO GUIDE CV LINE MEDICATIONS: None ANESTHESIA/SEDATION: None FLUOROSCOPY TIME:  Fluoroscopy Time: 0 minutes 12 seconds (5 mGy). COMPLICATIONS: None immediate. PROCEDURE: Informed written consent was obtained from the patient after a thorough discussion of the procedural risks, benefits and alternatives. All questions were addressed. Maximal Sterile Barrier Technique was utilized including caps, mask, sterile gowns,  sterile gloves, sterile drape, hand hygiene and skin antiseptic. A timeout was performed prior to the initiation of the procedure. The right internal jugular vein was interrogated with ultrasound and found to be widely patent. An image was obtained and stored for the medical record. Local anesthesia was attained by infiltration with 1% lidocaine. A small dermatotomy was made. Under real-time sonographic guidance, the vessel was punctured with a 21 gauge micropuncture needle. Using standard technique, the initial micro needle was exchanged over a 0.018 micro wire for a peel-away sheath. A suitable skin exit site inferior to the clavicle was selected. Local anesthesia was again attained by infiltration with 1% lidocaine. A small dermatotomy was made. The tunneled central venous catheter was then tunneled from the skin exit site to the dermatotomy overlying the venous access site. The catheter was cut to 22 cm (14 cm intravascular length) and advanced through the peel-away sheath. An image was obtained for the medical record documenting the tip of the catheter in the upper right atrium. The catheter was then capped and flushed. The catheter was secured to the skin at 3 locations using 0 Prolene suture. The dermatotomy over the venous access site was closed with Dermabond. Sterile bandages were placed. IMPRESSION: Successful placement of a power injectable dual lumen tunneled central venous catheter via the right internal jugular vein. The catheter tip is in the upper right atrium and the catheter is ready for immediate use. Electronically Signed   By: Dellis Filbert.D.  On: 11/25/2020 17:07   IR US Guide Vasc Access Right  Result Date: 11/25/2020 INDICATION: 75 year old female with dementia and infection requiring intravenous antibiotics. She has repeatedly pulled out every IV that has been placed. She requires durable venous access that is well secured. Therefore, she presents for placement of a tunneled  central venous catheter. EXAM: IR ULTRASOUND GUIDANCE VASC ACCESS RIGHT; IR RIGHT FLUORO GUIDE CV LINE MEDICATIONS: None ANESTHESIA/SEDATION: None FLUOROSCOPY TIME:  Fluoroscopy Time: 0 minutes 12 seconds (5 mGy). COMPLICATIONS: None immediate. PROCEDURE: Informed written consent was obtained from the patient after a thorough discussion of the procedural risks, benefits and alternatives. All questions were addressed. Maximal Sterile Barrier Technique was utilized including caps, mask, sterile gowns, sterile gloves, sterile drape, hand hygiene and skin antiseptic. A timeout was performed prior to the initiation of the procedure. The right internal jugular vein was interrogated with ultrasound and found to be widely patent. An image was obtained and stored for the medical record. Local anesthesia was attained by infiltration with 1% lidocaine. A small dermatotomy was made. Under real-time sonographic guidance, the vessel was punctured with a 21 gauge micropuncture needle. Using standard technique, the initial micro needle was exchanged over a 0.018 micro wire for a peel-away sheath. A suitable skin exit site inferior to the clavicle was selected. Local anesthesia was again attained by infiltration with 1% lidocaine. A small dermatotomy was made. The tunneled central venous catheter was then tunneled from the skin exit site to the dermatotomy overlying the venous access site. The catheter was cut to 22 cm (14 cm intravascular length) and advanced through the peel-away sheath. An image was obtained for the medical record documenting the tip of the catheter in the upper right atrium. The catheter was then capped and flushed. The catheter was secured to the skin at 3 locations using 0 Prolene suture. The dermatotomy over the venous access site was closed with Dermabond. Sterile bandages were placed. IMPRESSION: Successful placement of a power injectable dual lumen tunneled central venous catheter via the right internal  jugular vein. The catheter tip is in the upper right atrium and the catheter is ready for immediate use. Electronically Signed   By: Jacqulynn Cadet M.D.   On: 11/25/2020 17:07   DG Chest Port 1 View  Result Date: 11/27/2020 CLINICAL DATA:  Shortness of breath EXAM: PORTABLE CHEST 1 VIEW COMPARISON:  May 05, 2020 FINDINGS: The heart size and mediastinal contours are stable. Right central venous line is identified with distal tip in the superior vena cava. There is chronic elevation of right hemidiaphragm. Mild atelectasis of right lung base is noted. The left lung is clear. The visualized skeletal structures are unremarkable. IMPRESSION: Mild atelectasis of right lung base. Electronically Signed   By: Abelardo Diesel M.D.   On: 11/27/2020 08:21   EEG adult  Result Date: 11/21/2020 Plancher, Baron Sane, MD     11/21/2020  3:50 PM PROCEDURE: EEG routine 22 minutes with video study  DATES OF TEST: 11/21/2020  REASON FOR TEST: acute encephalopathy  AED/Sedative MEDICATIONS: n/a  TECHNIQUE: This is an 18 channel digital EEG recording using the standard international 10/20 system of electrode placement with one channel EKG recording. Pt was awake during this recording.  FINDINGS: Background rhythm: symmetric diffuse 4-8 Hz, mostly myogenic artifact. Amplitude :  low Continuity: continuous Breach effect:  NO Variability: YES Reactivity: YES Rhythmic delta activity: NO Periodic discharges: NO Sporadic epileptiform discharges: NO Electrographic/electroclinical seizures: NO  Limited EKG reveals no abnormalities.  IMPRESSION: This is an abnormal study due to - 1. Moderate to mild Generalized slowing is a non-specific finding consistent with a generalized disturbance of cerebral functioning including toxic, metabolic, or structural abnormalities that are multi-focal or diffuse. 2. No definite epileptiform discharges or electrographic seizures noted.    ECHOCARDIOGRAM COMPLETE  Result Date: 11/19/2020     ECHOCARDIOGRAM REPORT   Patient Name:   Becky Hendricks Date of Exam: 11/19/2020 Medical Rec #:  [id number]      Height:       66.0 in Accession #:    [id number]     Weight:       195.2 lb Date of Birth:  03/21/1946       BSA:          1.979 m Patient Age:    19 years       BP:           145/76 mmHg Patient Gender: F              HR:           75 bpm. Exam Location:  Inpatient Procedure: 2D Echo Indications:    bacteremia  History:        Patient has prior history of Echocardiogram examinations, most                 recent 12/23/2019. CHF, Arrythmias:Atrial Fibrillation,                 Signs/Symptoms:Bacteremia; Risk Factors:Diabetes.  Sonographer:    Johny Chess Referring Phys: [id number] LJQGB LATIF [facility]  Sonographer Comments: Image acquisition challenging due to uncooperative patient and Image acquisition challenging due to patient body habitus. IMPRESSIONS  1. Limited study due to poor acoustic windows. Left ventricular ejection fraction, by estimation, is 60 to 65%. The left ventricle has normal function. The left ventricle has no regional wall motion abnormalities. There is mild concentric left ventricular hypertrophy. Left ventricular diastolic parameters are consistent with Grade I diastolic dysfunction (impaired relaxation).  2. Right ventricular systolic function is mildly reduced. The right ventricular size is mildly-to-moderately enlarged.  3. The mitral valve is normal in structure. Trivial mitral valve regurgitation. No evidence of mitral stenosis.  4. The aortic valve is tricuspid. There is mild calcification of the aortic valve. There is mild thickening of the aortic valve. Aortic valve regurgitation is not visualized. Mild aortic valve sclerosis is present, with no evidence of aortic valve stenosis.  5. No valvular vegetations visualized on surface echo. Pending clinical suspicion for infective endocarditis, can consider TEE for further evaluation. Comparison(s): Compared to prior TTE on 20/10/07,  the RV systolic function appears mildly improved. Otherwise there is no significant change. Conclusion(s)/Recommendation(s): No evidence of valvular vegetations on this transthoracic echocardiogram. Would recommend a transesophageal echocardiogram to exclude infective endocarditis if clinically indicated. FINDINGS  Left Ventricle: Limited study due to poor acoustic windows. Left ventricular ejection fraction, by estimation, is 60 to 65%. The left ventricle has normal function. The left ventricle has no regional wall motion abnormalities. Definity contrast agent was given IV to delineate the left ventricular endocardial borders. The left ventricular internal cavity size was normal in size. There is mild concentric left ventricular hypertrophy. Left ventricular diastolic parameters are consistent with Grade I diastolic dysfunction (impaired relaxation). Right Ventricle: The right ventricular size is mildly-to-moderately enlarged. Right vetricular wall thickness was not well visualized. Right ventricular systolic function is mildly reduced. Left Atrium: Left atrial size was normal in size. Right Atrium:  Right atrial size was normal in size. Pericardium: There is no evidence of pericardial effusion. Mitral Valve: The mitral valve is normal in structure. There is mild thickening of the mitral valve leaflet(s). There is mild calcification of the mitral valve leaflet(s). Trivial mitral valve regurgitation. No evidence of mitral valve stenosis. Tricuspid Valve: The tricuspid valve is normal in structure. Tricuspid valve regurgitation is trivial. Aortic Valve: The aortic valve is tricuspid. There is mild calcification of the aortic valve. There is mild thickening of the aortic valve. Aortic valve regurgitation is not visualized. Mild aortic valve sclerosis is present, with no evidence of aortic valve stenosis. Pulmonic Valve: The pulmonic valve was not well visualized. Pulmonic valve regurgitation is not visualized. Aorta:  The aortic root and ascending aorta are structurally normal, with no evidence of dilitation. IAS/Shunts: No atrial level shunt detected by color flow Doppler.  LEFT VENTRICLE PLAX 2D LVIDd:         4.26 cm LVIDs:         2.71 cm LV PW:         1.17 cm LV IVS:        1.06 cm LVOT diam:     1.80 cm LVOT Area:     2.54 cm  RIGHT VENTRICLE RV S prime:     9.90 cm/s LEFT ATRIUM         Index LA diam:    3.80 cm 1.92 cm/m   AORTA Ao Root diam: 2.90 cm Ao Asc diam:  3.40 cm  SHUNTS Systemic Diam: 1.80 cm Gwyndolyn Kaufman MD Electronically signed by Gwyndolyn Kaufman MD Signature Date/Time: 11/19/2020/5:00:20 PM    Final    [place] EKG SITE RITE  Result Date: 11/23/2020 If Site Rite image not attached, placement could not be confirmed due to current cardiac rhythm.  DG FL GUIDED LUMBAR PUNCTURE  Result Date: 11/18/2020 CLINICAL DATA:  Encephalopathy.  Fever.  Rule out meningitis EXAM: DIAGNOSTIC LUMBAR PUNCTURE UNDER FLUOROSCOPIC GUIDANCE COMPARISON:  CT abdomen pelvis 11/17/2020 FLUOROSCOPY TIME:  Fluoroscopy Time:  0 minutes 48 seconds Radiation Exposure Index (if provided by the fluoroscopic device): Number of Acquired Spot Images: 1 PROCEDURE: Patient was sedated prior to the study. The patient was uncooperative. Informed consent was obtained from the patient's brother by telephone prior to the procedure, including potential complications of headache, allergy, and pain. With the patient prone, the lower back was prepped with Betadine. 1% Lidocaine was used for local anesthesia. Lumbar puncture was performed at the L2-3 level using a 20 gauge needle with return of clear CSF with an opening pressure of 13 cm water. Six ml of CSF were obtained for laboratory studies. The patient tolerated the procedure well and there were no apparent complications. IMPRESSION: Successful lumbar puncture.  Clear CSF. Electronically Signed   By: Franchot Gallo M.D.   On: 11/18/2020 14:20       Subjective: No chest pain or sob.   Discharge Exam: Vitals:   12/02/20 0747 12/02/20 0920  BP: 135/78 (!) 121/57  Pulse: 91 100  Resp: 18   Temp: 99.1 F (37.3 C)   SpO2: 95%    Vitals:   12/01/20 2327 12/02/20 0333 12/02/20 0747 12/02/20 0920  BP: (!) 120/57 127/61 135/78 (!) 121/57  Pulse: 84 (!) 101 91 100  Resp: _0 Temp: 98 F (36.7 C) 98.3 F (36.8 C) 99.1 F (37.3 C)   TempSrc: Oral Oral Oral   SpO2: 93% 94% 95%   Weight:  Height:        General: Pt is alert, awake, not in acute distress Cardiovascular: RRR, S1/S2 +, no rubs, no gallops Respiratory: CTA bilaterally, no wheezing, no rhonchi Abdominal: Soft, NT, ND, bowel sounds + Extremities: no edema, no cyanosis    The results of significant diagnostics from this hospitalization (including imaging, microbiology, ancillary and laboratory) are listed below for reference.     Microbiology: No results found for this or any previous visit (from the past 240 hour(s)).   Labs: BNP (last 3 results) Recent Labs    11/28/20 0500 11/29/20 0505 11/30/20 0500  BNP 38.6 43.6 10.9   Basic Metabolic Panel: Recent Labs  Lab 11/26/20 0216 11/27/20 0103 11/28/20 0500 11/29/20 0505 11/30/20 0500  NA 140 140 141 138 139  K 4.3 4.9 4.6 4.4 4.6  CL 105 102 100 99 99  CO2 32 33* 34* 35* 34*  GLUCOSE 150* 153* 131* 123* 124*  BUN _0 CREATININE 0.76 0.81 0.62 0.60 0.54  CALCIUM 8.2* 8.5* 8.9 8.4* 8.9  MG 2.1 2.0 1.9 2.0 2.0   Liver Function Tests: Recent Labs  Lab 11/26/20 0216 11/27/20 0103 11/28/20 0500 11/29/20 0505 11/30/20 0500  AST 13* 9* 11* 11* 14*  ALT _1 ALKPHOS 46 49 46 43 43  BILITOT 0.6 0.3 0.4 0.6 0.4  PROT 4.7* 4.8* 4.9* 4.8* 5.4*  ALBUMIN 2.3* 2.3* 2.4* 2.3* 2.5*   No results for input(s): LIPASE, AMYLASE in the last 168 hours. No results for input(s): AMMONIA in the last 168 hours. CBC: Recent Labs  Lab 11/26/20 0216 11/27/20 0103 11/28/20 0500 11/29/20 0505 11/30/20 0500   WBC 8.1 11.5* 10.0 10.5 11.4*  NEUTROABS 3.7 5.5 4.1 4.5 4.9  HGB 11.6* 11.1* 11.5* 10.7* 10.7*  HCT 37.0 35.7* 36.4 33.8* 33.9*  MCV 91.8 93.0 91.5 91.6 91.4  PLT 218 212 208 204 201   Cardiac Enzymes: Recent Labs  Lab 11/28/20 0500  CKTOTAL 52   BNP: Invalid input(s): POCBNP CBG: Recent Labs  Lab 12/01/20 0605 12/01/20 1139 12/01/20 1729 12/01/20 2329 12/02/20 0609  GLUCAP 128* 360* 153* 213* 168*   D-Dimer No results for input(s): DDIMER in the last 72 hours. Hgb A1c No results for input(s): HGBA1C in the last 72 hours. Lipid Profile No results for input(s): CHOL, HDL, LDLCALC, TRIG, CHOLHDL, LDLDIRECT in the last 72 hours. Thyroid function studies No results for input(s): TSH, T4TOTAL, T3FREE, THYROIDAB in the last 72 hours.  Invalid input(s): FREET3 Anemia work up No results for input(s): VITAMINB12, FOLATE, FERRITIN, TIBC, IRON, RETICCTPCT in the last 72 hours. Urinalysis    Component Value Date/Time   COLORURINE STRAW (A) 11/17/2020 1714   APPEARANCEUR CLEAR 11/17/2020 1714   LABSPEC 1.013 11/17/2020 1714   PHURINE 8.0 11/17/2020 1714   GLUCOSEU 50 (A) 11/17/2020 1714   HGBUR NEGATIVE 11/17/2020 1714   BILIRUBINUR NEGATIVE 11/17/2020 1714   KETONESUR 20 (A) 11/17/2020 1714   PROTEINUR NEGATIVE 11/17/2020 1714   NITRITE NEGATIVE 11/17/2020 1714   LEUKOCYTESUR NEGATIVE 11/17/2020 1714   Sepsis Labs Invalid input(s): PROCALCITONIN,  WBC,  LACTICIDVEN Microbiology No results found for this or any previous visit (from the past 240 hour(s)).   Time coordinating discharge:32 minutes  SIGNED:   Hosie Poisson, MD  [facility] 12/02/2020, 11:18 AM

## 2020-12-02 NOTE — Plan of Care (Signed)
  Problem: Coping: Goal: Level of anxiety will decrease Outcome: Progressing   Problem: Safety: Goal: Ability to remain free from injury will improve Outcome: Progressing   Problem: Health Behavior/Discharge Planning: Goal: Ability to manage health-related needs will improve Outcome: Progressing   Problem: Clinical Measurements: Goal: Diagnostic test results will improve Outcome: Progressing   Problem: Activity: Goal: Risk for activity intolerance will decrease Outcome: Progressing   Problem: Nutrition: Goal: Adequate nutrition will be maintained Outcome: Progressing   Problem: Ischemic Stroke/TIA Tissue Perfusion: Goal: Complications of ischemic stroke/TIA will be minimized Outcome: Progressing

## 2020-12-02 NOTE — TOC Transition Note (Signed)
Transition of Care [facility]) - CM/SW Discharge Note   Patient Details  Name: Becky Hendricks MRN: [id number] Date of Birth: 1946-05-27  Transition of Care [facility]) CM/SW Contact:  Baldemar Lenis, LCSW Phone Number: 12/02/2020, 2:00 PM   Clinical Narrative:   Nurse to call report to [phone], Room 805A    Final next level of care: Skilled Nursing Facility Barriers to Discharge: Barriers Resolved   Patient Goals and CMS Choice Patient states their goals for this hospitalization and ongoing recovery are:: Unable to assess patient disoriented CMS Medicare.gov Compare Post Acute Care list provided to:: Patient Represenative (must comment) Choice offered to / list presented to : Sibling  Discharge Placement              Patient [facility] bed at: [facility] Patient to be transferred to facility by: [facility] Name of family member notified: [name] Patient and family notified of of transfer: 12/02/20  Discharge Plan and Services                                     Social Determinants of Health (SDOH) Interventions     Readmission Risk Interventions No flowsheet data found.

## 2020-12-20 ENCOUNTER — Ambulatory Visit (INDEPENDENT_AMBULATORY_CARE_PROVIDER_SITE_OTHER): Payer: Medicare Other | Admitting: Infectious Disease

## 2020-12-20 ENCOUNTER — Other Ambulatory Visit: Payer: Self-pay

## 2020-12-20 VITALS — BP 129/67 | HR 72 | Temp 98.0°F | Wt 194.0 lb

## 2020-12-20 DIAGNOSIS — M86159 Other acute osteomyelitis, unspecified femur: Secondary | ICD-10-CM

## 2020-12-20 DIAGNOSIS — F3111 Bipolar disorder, current episode manic without psychotic features, mild: Secondary | ICD-10-CM

## 2020-12-20 DIAGNOSIS — L0231 Cutaneous abscess of buttock: Secondary | ICD-10-CM

## 2020-12-20 DIAGNOSIS — R7881 Bacteremia: Secondary | ICD-10-CM | POA: Diagnosis not present

## 2020-12-20 DIAGNOSIS — B9562 Methicillin resistant Staphylococcus aureus infection as the cause of diseases classified elsewhere: Secondary | ICD-10-CM

## 2020-12-20 DIAGNOSIS — I5032 Chronic diastolic (congestive) heart failure: Secondary | ICD-10-CM

## 2020-12-20 DIAGNOSIS — S31000D Unspecified open wound of lower back and pelvis without penetration into retroperitoneum, subsequent encounter: Secondary | ICD-10-CM

## 2020-12-20 NOTE — Progress Notes (Signed)
Subjective:  Chief complaint she would like to have her central line removed    Patient ID: Becky Hendricks, female    DOB: [birth year], 75 y.o.   MRN: [id number]  HPI   75 year old [place] woman who suffers from bipolar disorder that at times has not been well controlled who succumbed to COVID-19 infection and then became bedbound.  She developed chronic polymicrobial decubitus ulcers which were treated with IV antibiotics followed by oral antibiotics.  Those had resolved but then she was readmitted to the hospital in April AprilApril and found to have MRSA bacteremia.  She cleared her blood cultures and her 2D echocardiogram was without vegetations.  She had no other evidence for deep infection.  I did feel that she should be treated 4 weeks of antibiotics and she was given daptomycin with a stop date being today.  She appears to be doing much better and her bipolar disorder is much better controlled than when I had seen her during most of her hospital stay.  She also apologized to me for insulting me although I cannot remember  Past Medical History:  Diagnosis Date  . Atrial fibrillation ([place])   . Bipolar disorder ([place])   . Bipolar disorder ([place]) 04/27/2020  . Clostridium difficile diarrhea 08/02/2019  . Dehydration   . Essential hypertension   . Fall 02/11/2020  . Morbid obesity ([place])   . Osteomyelitis ([place]) 12/03/2019  . Pneumonia due to COVID-19 virus 09/15/2019  . Type 2 diabetes mellitus ([place])   . Weakness     Past Surgical History:  Procedure Laterality Date  . INCISION AND DRAINAGE PERIRECTAL ABSCESS Left 12/04/2019   Procedure: IRRIGATION AND DEBRIDEMENT LEFT BUTTOCK ABSCESS;  Surgeon: Georganna Skeans, MD;  Location: [facility];  Service: General;  Laterality: Left;  . INCISION AND DRAINAGE PERIRECTAL ABSCESS Left 12/10/2019   Procedure: REPEAT IRRIGATION AND DEBRIDEMENT BUTTOCK  ABSCESS;  Surgeon: Coralie Keens, MD;  Location: [facility];  Service: General;  Laterality: Left;  . IR  FLUORO GUIDE CV LINE RIGHT  11/25/2020  . IR US GUIDE VASC ACCESS RIGHT  11/25/2020    Family History  Family history unknown: Yes      Social History   Socioeconomic History  . Marital status: Single    Spouse name: Not on file  . Number of children: Not on file  . Years of education: Not on file  . Highest education level: Not on file  Occupational History  . Not on file  Tobacco Use  . Smoking status: Never Smoker  . Smokeless tobacco: Never Used  Vaping Use  . Vaping Use: Unknown  Substance and Sexual Activity  . Alcohol use: Not Currently  . Drug use: Not Currently  . Sexual activity: Not Currently    Birth control/protection: Post-menopausal  Other Topics Concern  . Not on file  Social History Narrative  . Not on file   Social Determinants of Health   Financial Resource Strain: Not on file  Food Insecurity: Not on file  Transportation Needs: Not on file  Physical Activity: Not on file  Stress: Not on file  Social Connections: Not on file    Allergies  Allergen Reactions  . Chlorhexidine Gluconate Itching  . Vancomycin     Vancomycin infusion related reaction- May need Vanc to be given at a slower rate   . Acetazolamide Er Rash     Current Outpatient Medications:  .  Amino Acids-Protein Hydrolys (FEEDING SUPPLEMENT, PRO-STAT SUGAR FREE 64,) LIQD, Take 30  mLs by mouth 2 (two) times daily., Disp: 887 mL, Rfl: 0 .  amLODipine (NORVASC) 10 MG tablet, Take 1 tablet (10 mg total) by mouth daily., Disp: 30 tablet, Rfl: 1 .  apixaban (ELIQUIS) 5 MG TABS tablet, Take 5 mg by mouth 2 (two) times daily., Disp: , Rfl:  .  atorvastatin (LIPITOR) 10 MG tablet, Take 10 mg by mouth daily., Disp: , Rfl:  .  calcium carbonate (TUMS - DOSED IN MG ELEMENTAL CALCIUM) 500 MG chewable tablet, Chew 500 mg by mouth daily., Disp: , Rfl:  .  carvedilol (COREG) 6.25 MG tablet, Take 1 tablet (6.25 mg total) by mouth 2 (two) times daily with a meal., Disp: 60 tablet, Rfl: 1 .   Cholecalciferol (VITAMIN D) 125 MCG (5000 UT) CAPS, Take 10,000 Units by mouth once a week. Wednesday, Disp: , Rfl:  .  daptomycin (CUBICIN) IVPB, Inject 700 mg into the vein daily for 26 days. Indication: MRSA bacteremia  First Dose: Yes Last Day of Therapy: 12/20/20 Labs - Once weekly:  CBC/D, BMP, and CPK Labs - Every other week:  ESR and CRP Method of administration: IV Push Method of administration may be changed at the discretion of home infusion pharmacist based upon assessment of the patient and/or caregiver's ability to self-administer the medication ordered., Disp: 26 Units, Rfl: 0 .  diphenhydrAMINE (BENADRYL) 2 % cream, Apply 1 application topically 3 (three) times daily as needed for itching., Disp: , Rfl:  .  divalproex (DEPAKOTE) 500 MG DR tablet, Take 500 mg by mouth 2 (two) times daily., Disp: , Rfl:  .  docusate sodium (COLACE) 100 MG capsule, Take 1 capsule (100 mg total) by mouth 2 (two) times daily as needed for mild constipation., Disp: 10 capsule, Rfl: 0 .  insulin aspart (NOVOLOG) 100 UNIT/ML injection, CBG 70 - 120: 0 units CBG 121 - 150: 2 units CBG 151 - 200: 3 units CBG 201 - 250: 5 units CBG 251 - 300: 8 units CBG 301 - 350: 11 units CBG 351 - 400: 15 units, Disp: 10 mL, Rfl: 11 .  Ipratropium-Albuterol (COMBIVENT RESPIMAT) 20-100 MCG/ACT AERS respimat, Inhale 1 puff into the lungs every 12 (twelve) hours., Disp: , Rfl:  .  Multiple Vitamin (MULTIVITAMIN WITH MINERALS) TABS tablet, Take 1 tablet by mouth daily., Disp: , Rfl:  .  pantoprazole (PROTONIX) 40 MG tablet, Take 1 tablet (40 mg total) by mouth 2 (two) times daily. (Patient taking differently: Take 40 mg by mouth daily.), Disp: 30 tablet, Rfl: 0 .  polyethylene glycol (MIRALAX / GLYCOLAX) 17 g packet, Take 17 g by mouth 2 (two) times daily., Disp: 14 each, Rfl: 0 .  risperiDONE (RISPERDAL M-TABS) 2 MG disintegrating tablet, Take 1 tablet (2 mg total) by mouth 2 (two) times daily., Disp: 60 tablet, Rfl: 0 .   sennosides-docusate sodium (SENOKOT-S) 8.6-50 MG tablet, Take 1 tablet by mouth daily., Disp: , Rfl:  .  tamsulosin (FLOMAX) 0.4 MG CAPS capsule, Take 1 capsule (0.4 mg total) by mouth daily., Disp: 30 capsule, Rfl: 1   Review of Systems  Unable to perform ROS: Psychiatric disorder       Objective:   Physical Exam Constitutional:      Appearance: She is obese.  HENT:     Head: Normocephalic and atraumatic.  Eyes:     Extraocular Movements: Extraocular movements intact.     Conjunctiva/sclera: Conjunctivae normal.  Cardiovascular:     Rate and Rhythm: Normal rate.  Pulmonary:  Effort: Pulmonary effort is normal. No respiratory distress.     Breath sounds: No stridor.  Abdominal:     General: Abdomen is flat.     Palpations: Abdomen is soft.  Neurological:     General: No focal deficit present.     Mental Status: She is alert.     Motor: Weakness present.  Psychiatric:        Attention and Perception: Attention normal.        Mood and Affect: Mood and affect normal.        Speech: Speech is tangential.        Thought Content: Thought content is delusional.        Cognition and Memory: Cognition is impaired.   central line 12/19/2020:     Decubitus ulcer 12/19/2020:            Assessment & Plan:  MRSA bacteremia: Has completed 4 weeks of therapy and her line can be removed I have put in a consult for interventional radiology to remove it.  Previously polymicrobial deep sacral decubitus ulcers: These seem to have resolved.  Bipolar disorder seems much better controlled.  Bedbound status her brother says that she has been able to get out of the bed some but when she is began to make progress she has had setbacks with various infections  I spent greater than 40 minutes with the patient including greater than 50% of time in face to face counsel of the patient and her brother in review of pertinent radiographic records personally along with laboratory data and in  coordination of her care.

## 2020-12-21 LAB — FUNGUS CULTURE WITH STAIN

## 2020-12-21 LAB — FUNGAL ORGANISM REFLEX

## 2020-12-21 LAB — FUNGUS CULTURE RESULT

## 2020-12-29 ENCOUNTER — Other Ambulatory Visit: Payer: Self-pay

## 2020-12-29 ENCOUNTER — Ambulatory Visit (HOSPITAL_COMMUNITY)
Admission: RE | Admit: 2020-12-29 | Discharge: 2020-12-29 | Disposition: A | Discharge status: Home / Self Care | Payer: Medicare Other | Source: Ambulatory Visit | Attending: Infectious Disease | Admitting: Infectious Disease

## 2020-12-29 DIAGNOSIS — M86159 Other acute osteomyelitis, unspecified femur: Secondary | ICD-10-CM

## 2020-12-29 DIAGNOSIS — B9562 Methicillin resistant Staphylococcus aureus infection as the cause of diseases classified elsewhere: Secondary | ICD-10-CM

## 2020-12-29 HISTORY — PX: IR RADIOLOGIST EVAL & MGMT: IMG5224

## 2020-12-29 MED ORDER — LIDOCAINE HCL 1 % IJ SOLN
INTRAMUSCULAR | Status: AC
  Filled 2020-12-29: qty 20

## 2020-12-29 NOTE — Procedures (Signed)
PROCEDURE SUMMARY:  Successful removal of right IJ tunneled central venous catheter, removed intact. No immediate complications.  EBL < 1 mL. Pressure dressing (gauze + tegaderm) applied to site. Patient tolerated well.   Discharge instructions: 1- Ok to shower 24 hours post-removal. 2- No submerging (swimming, bathing) for 7 days post-removal. 3- Ok to remove bandage after first shower, no further dressing changes needed- ensure area remains clean and dry until fully healed.  Please see imaging section of Epic for full dictation.   G[place]y Councilman Alexsa Flaum PA-C 12/29/2020 10:47 AM

## 2021-03-29 ENCOUNTER — Emergency Department (HOSPITAL_COMMUNITY): Payer: Medicare Other

## 2021-03-29 ENCOUNTER — Inpatient Hospital Stay (HOSPITAL_COMMUNITY): Payer: Medicare Other

## 2021-03-29 ENCOUNTER — Encounter (HOSPITAL_COMMUNITY): Payer: Self-pay | Admitting: Emergency Medicine

## 2021-03-29 ENCOUNTER — Inpatient Hospital Stay (HOSPITAL_COMMUNITY)
Admission: EM | Admit: 2021-03-29 | Discharge: 2021-04-07 | DRG: 871 | Disposition: A | Discharge status: Skilled Nursing Facility | Payer: Medicare Other | Source: Skilled Nursing Facility | Attending: Internal Medicine | Admitting: Internal Medicine

## 2021-03-29 DIAGNOSIS — E860 Dehydration: Secondary | ICD-10-CM | POA: Diagnosis present

## 2021-03-29 DIAGNOSIS — J9600 Acute respiratory failure, unspecified whether with hypoxia or hypercapnia: Secondary | ICD-10-CM | POA: Diagnosis present

## 2021-03-29 DIAGNOSIS — Z20822 Contact with and (suspected) exposure to covid-19: Secondary | ICD-10-CM | POA: Diagnosis not present

## 2021-03-29 DIAGNOSIS — G9341 Metabolic encephalopathy: Secondary | ICD-10-CM | POA: Diagnosis not present

## 2021-03-29 DIAGNOSIS — G928 Other toxic encephalopathy: Secondary | ICD-10-CM | POA: Diagnosis not present

## 2021-03-29 DIAGNOSIS — J9621 Acute and chronic respiratory failure with hypoxia: Secondary | ICD-10-CM | POA: Diagnosis not present

## 2021-03-29 DIAGNOSIS — E785 Hyperlipidemia, unspecified: Secondary | ICD-10-CM | POA: Diagnosis present

## 2021-03-29 DIAGNOSIS — R6520 Severe sepsis without septic shock: Secondary | ICD-10-CM | POA: Diagnosis not present

## 2021-03-29 DIAGNOSIS — Z794 Long term (current) use of insulin: Secondary | ICD-10-CM

## 2021-03-29 DIAGNOSIS — G40909 Epilepsy, unspecified, not intractable, without status epilepticus: Secondary | ICD-10-CM | POA: Diagnosis present

## 2021-03-29 DIAGNOSIS — Z6838 Body mass index (BMI) 38.0-38.9, adult: Secondary | ICD-10-CM | POA: Diagnosis not present

## 2021-03-29 DIAGNOSIS — J9622 Acute and chronic respiratory failure with hypercapnia: Secondary | ICD-10-CM | POA: Diagnosis present

## 2021-03-29 DIAGNOSIS — Z8616 Personal history of COVID-19: Secondary | ICD-10-CM

## 2021-03-29 DIAGNOSIS — M6281 Muscle weakness (generalized): Secondary | ICD-10-CM | POA: Diagnosis not present

## 2021-03-29 DIAGNOSIS — Z4659 Encounter for fitting and adjustment of other gastrointestinal appliance and device: Secondary | ICD-10-CM

## 2021-03-29 DIAGNOSIS — F3160 Bipolar disorder, current episode mixed, unspecified: Secondary | ICD-10-CM | POA: Diagnosis not present

## 2021-03-29 DIAGNOSIS — F319 Bipolar disorder, unspecified: Secondary | ICD-10-CM | POA: Diagnosis present

## 2021-03-29 DIAGNOSIS — A419 Sepsis, unspecified organism: Secondary | ICD-10-CM | POA: Diagnosis not present

## 2021-03-29 DIAGNOSIS — B9689 Other specified bacterial agents as the cause of diseases classified elsewhere: Secondary | ICD-10-CM | POA: Diagnosis present

## 2021-03-29 DIAGNOSIS — E872 Acidosis: Secondary | ICD-10-CM | POA: Diagnosis present

## 2021-03-29 DIAGNOSIS — I5032 Chronic diastolic (congestive) heart failure: Secondary | ICD-10-CM | POA: Diagnosis present

## 2021-03-29 DIAGNOSIS — F29 Unspecified psychosis not due to a substance or known physiological condition: Secondary | ICD-10-CM | POA: Diagnosis present

## 2021-03-29 DIAGNOSIS — R4182 Altered mental status, unspecified: Secondary | ICD-10-CM | POA: Diagnosis not present

## 2021-03-29 DIAGNOSIS — I480 Paroxysmal atrial fibrillation: Secondary | ICD-10-CM | POA: Diagnosis not present

## 2021-03-29 DIAGNOSIS — Z888 Allergy status to other drugs, medicaments and biological substances status: Secondary | ICD-10-CM

## 2021-03-29 DIAGNOSIS — E1165 Type 2 diabetes mellitus with hyperglycemia: Secondary | ICD-10-CM | POA: Diagnosis present

## 2021-03-29 DIAGNOSIS — B964 Proteus (mirabilis) (morganii) as the cause of diseases classified elsewhere: Secondary | ICD-10-CM | POA: Diagnosis present

## 2021-03-29 DIAGNOSIS — E871 Hypo-osmolality and hyponatremia: Secondary | ICD-10-CM | POA: Diagnosis not present

## 2021-03-29 DIAGNOSIS — J9601 Acute respiratory failure with hypoxia: Secondary | ICD-10-CM | POA: Diagnosis present

## 2021-03-29 DIAGNOSIS — G9340 Encephalopathy, unspecified: Secondary | ICD-10-CM | POA: Diagnosis present

## 2021-03-29 DIAGNOSIS — R532 Functional quadriplegia: Secondary | ICD-10-CM | POA: Diagnosis present

## 2021-03-29 DIAGNOSIS — E119 Type 2 diabetes mellitus without complications: Secondary | ICD-10-CM

## 2021-03-29 DIAGNOSIS — K529 Noninfective gastroenteritis and colitis, unspecified: Secondary | ICD-10-CM | POA: Diagnosis present

## 2021-03-29 DIAGNOSIS — E8770 Fluid overload, unspecified: Secondary | ICD-10-CM | POA: Diagnosis not present

## 2021-03-29 DIAGNOSIS — J189 Pneumonia, unspecified organism: Secondary | ICD-10-CM | POA: Diagnosis not present

## 2021-03-29 DIAGNOSIS — R6521 Severe sepsis with septic shock: Secondary | ICD-10-CM | POA: Diagnosis not present

## 2021-03-29 DIAGNOSIS — E662 Morbid (severe) obesity with alveolar hypoventilation: Secondary | ICD-10-CM | POA: Diagnosis not present

## 2021-03-29 DIAGNOSIS — G459 Transient cerebral ischemic attack, unspecified: Secondary | ICD-10-CM | POA: Diagnosis present

## 2021-03-29 DIAGNOSIS — R001 Bradycardia, unspecified: Secondary | ICD-10-CM | POA: Diagnosis present

## 2021-03-29 DIAGNOSIS — I110 Hypertensive heart disease with heart failure: Secondary | ICD-10-CM | POA: Diagnosis present

## 2021-03-29 DIAGNOSIS — Z881 Allergy status to other antibiotic agents status: Secondary | ICD-10-CM

## 2021-03-29 DIAGNOSIS — Z7901 Long term (current) use of anticoagulants: Secondary | ICD-10-CM

## 2021-03-29 DIAGNOSIS — E669 Obesity, unspecified: Secondary | ICD-10-CM | POA: Diagnosis present

## 2021-03-29 DIAGNOSIS — J9602 Acute respiratory failure with hypercapnia: Secondary | ICD-10-CM

## 2021-03-29 DIAGNOSIS — Z8614 Personal history of Methicillin resistant Staphylococcus aureus infection: Secondary | ICD-10-CM

## 2021-03-29 DIAGNOSIS — K5900 Constipation, unspecified: Secondary | ICD-10-CM | POA: Diagnosis present

## 2021-03-29 DIAGNOSIS — Z79899 Other long term (current) drug therapy: Secondary | ICD-10-CM

## 2021-03-29 DIAGNOSIS — Z781 Physical restraint status: Secondary | ICD-10-CM

## 2021-03-29 DIAGNOSIS — S31000A Unspecified open wound of lower back and pelvis without penetration into retroperitoneum, initial encounter: Secondary | ICD-10-CM | POA: Diagnosis present

## 2021-03-29 DIAGNOSIS — N390 Urinary tract infection, site not specified: Secondary | ICD-10-CM | POA: Diagnosis present

## 2021-03-29 DIAGNOSIS — Z7189 Other specified counseling: Secondary | ICD-10-CM | POA: Diagnosis not present

## 2021-03-29 DIAGNOSIS — A4189 Other specified sepsis: Principal | ICD-10-CM | POA: Diagnosis present

## 2021-03-29 DIAGNOSIS — R451 Restlessness and agitation: Secondary | ICD-10-CM | POA: Diagnosis not present

## 2021-03-29 DIAGNOSIS — Z515 Encounter for palliative care: Secondary | ICD-10-CM | POA: Diagnosis not present

## 2021-03-29 HISTORY — DX: Pneumonia, unspecified organism: J18.9

## 2021-03-29 HISTORY — DX: Noninfective gastroenteritis and colitis, unspecified: K52.9

## 2021-03-29 HISTORY — DX: Acute respiratory failure, unspecified whether with hypoxia or hypercapnia: J96.00

## 2021-03-29 LAB — COMPREHENSIVE METABOLIC PANEL
ALT: 9 U/L (ref 0–44)
AST: 9 U/L — ABNORMAL LOW (ref 15–41)
Albumin: 3.1 g/dL — ABNORMAL LOW (ref 3.5–5.0)
Alkaline Phosphatase: 39 U/L (ref 38–126)
Anion gap: 9 (ref 5–15)
BUN: 16 mg/dL (ref 8–23)
CO2: 36 mmol/L — ABNORMAL HIGH (ref 22–32)
Calcium: 8.6 mg/dL — ABNORMAL LOW (ref 8.9–10.3)
Chloride: 87 mmol/L — ABNORMAL LOW (ref 98–111)
Creatinine, Ser: 0.55 mg/dL (ref 0.44–1.00)
GFR, Estimated: >60 mL/min (ref >60)
Glucose, Bld: 121 mg/dL — ABNORMAL HIGH (ref 70–99)
Potassium: 4.9 mmol/L (ref 3.5–5.1)
Sodium: 132 mmol/L — ABNORMAL LOW (ref 135–145)
Total Bilirubin: 0.4 mg/dL (ref 0.3–1.2)
Total Protein: 6.3 g/dL — ABNORMAL LOW (ref 6.5–8.1)

## 2021-03-29 LAB — BLOOD GAS, ARTERIAL
Acid-Base Excess: 1.1 mmol/L (ref 0.0–2.0)
Acid-Base Excess: 3.5 mmol/L — ABNORMAL HIGH (ref 0.0–2.0)
Bicarbonate: 35 mmol/L — ABNORMAL HIGH (ref 20.0–28.0)
Bicarbonate: 29.7 mmol/L — ABNORMAL HIGH (ref 20.0–28.0)
FIO2: 100
O2 Content: 15 L/min
O2 Saturation: 95.2 %
O2 Saturation: 99.4 %
Patient temperature: 98.6
Patient temperature: 94.8
pCO2 arterial: 123 mmHg (ref 32.0–48.0)
pCO2 arterial: 51.5 mmHg — ABNORMAL HIGH (ref 32.0–48.0)
pH, Arterial: 7.084 — CL (ref 7.350–7.450)
pH, Arterial: 7.367 (ref 7.350–7.450)
pO2, Arterial: 102 mmHg (ref 83.0–108.0)
pO2, Arterial: 129 mmHg — ABNORMAL HIGH (ref 83.0–108.0)

## 2021-03-29 LAB — D-DIMER, QUANTITATIVE: D-Dimer, Quant: <0.27 ug/mL-FEU (ref 0.00–0.50)

## 2021-03-29 LAB — SALICYLATE LEVEL: Salicylate Lvl: <7 mg/dL — ABNORMAL LOW (ref 7.0–30.0)

## 2021-03-29 LAB — CBC WITH DIFFERENTIAL/PLATELET
Abs Immature Granulocytes: 0.2 10*3/uL — ABNORMAL HIGH (ref 0.00–0.07)
Basophils Absolute: 0.1 10*3/uL (ref 0.0–0.1)
Basophils Relative: 1 %
Eosinophils Absolute: 0.1 10*3/uL (ref 0.0–0.5)
Eosinophils Relative: 1 %
HCT: 39.1 % (ref 36.0–46.0)
Hemoglobin: 12.2 g/dL (ref 12.0–15.0)
Immature Granulocytes: 2 %
Lymphocytes Relative: 24 %
Lymphs Abs: 3.1 10*3/uL (ref 0.7–4.0)
MCH: 28.6 pg (ref 26.0–34.0)
MCHC: 31.2 g/dL (ref 30.0–36.0)
MCV: 91.8 fL (ref 80.0–100.0)
Monocytes Absolute: 1.6 10*3/uL — ABNORMAL HIGH (ref 0.1–1.0)
Monocytes Relative: 13 %
Neutro Abs: 7.7 10*3/uL (ref 1.7–7.7)
Neutrophils Relative %: 59 %
Platelets: 193 10*3/uL (ref 150–400)
RBC: 4.26 MIL/uL (ref 3.87–5.11)
RDW: 15.1 % (ref 11.5–15.5)
WBC: 12.7 10*3/uL — ABNORMAL HIGH (ref 4.0–10.5)
nRBC: 0 % (ref 0.0–0.2)

## 2021-03-29 LAB — RAPID URINE DRUG SCREEN, HOSP PERFORMED
Amphetamines: NOT DETECTED
Barbiturates: NOT DETECTED
Benzodiazepines: NOT DETECTED
Cocaine: NOT DETECTED
Opiates: NOT DETECTED
Tetrahydrocannabinol: NOT DETECTED

## 2021-03-29 LAB — URINALYSIS, ROUTINE W REFLEX MICROSCOPIC
Bilirubin Urine: NEGATIVE
Glucose, UA: NEGATIVE mg/dL
Hgb urine dipstick: NEGATIVE
Ketones, ur: NEGATIVE mg/dL
Nitrite: POSITIVE — AB
Protein, ur: NEGATIVE mg/dL
Specific Gravity, Urine: 1.034 — ABNORMAL HIGH (ref 1.005–1.030)
WBC, UA: >50 WBC/hpf — ABNORMAL HIGH (ref 0–5)
pH: 6 (ref 5.0–8.0)

## 2021-03-29 LAB — CREATININE, URINE, RANDOM: Creatinine, Urine: 74.11 mg/dL

## 2021-03-29 LAB — TROPONIN I (HIGH SENSITIVITY)
Troponin I (High Sensitivity): 7 ng/L (ref <18)
Troponin I (High Sensitivity): 7 ng/L (ref <18)

## 2021-03-29 LAB — T4, FREE: Free T4: 0.93 ng/dL (ref 0.61–1.12)

## 2021-03-29 LAB — TSH: TSH: 4.559 u[IU]/mL — ABNORMAL HIGH (ref 0.350–4.500)

## 2021-03-29 LAB — BRAIN NATRIURETIC PEPTIDE: B Natriuretic Peptide: 118.6 pg/mL — ABNORMAL HIGH (ref 0.0–100.0)

## 2021-03-29 LAB — RESP PANEL BY RT-PCR (FLU A&B, COVID) ARPGX2
Influenza A by PCR: NEGATIVE
Influenza B by PCR: NEGATIVE
SARS Coronavirus 2 by RT PCR: NEGATIVE

## 2021-03-29 LAB — MAGNESIUM: Magnesium: 2 mg/dL (ref 1.7–2.4)

## 2021-03-29 LAB — VALPROIC ACID LEVEL: Valproic Acid Lvl: 53 ug/mL (ref 50.0–100.0)

## 2021-03-29 LAB — PHOSPHORUS: Phosphorus: 6.3 mg/dL — ABNORMAL HIGH (ref 2.5–4.6)

## 2021-03-29 LAB — CK: Total CK: 34 U/L — ABNORMAL LOW (ref 38–234)

## 2021-03-29 LAB — LACTIC ACID, PLASMA: Lactic Acid, Venous: 1 mmol/L (ref 0.5–1.9)

## 2021-03-29 LAB — OSMOLALITY: Osmolality: 292 mOsm/kg (ref 275–295)

## 2021-03-29 LAB — AMMONIA: Ammonia: 16 umol/L (ref 9–35)

## 2021-03-29 LAB — SODIUM, URINE, RANDOM: Sodium, Ur: 24 mmol/L

## 2021-03-29 IMAGING — DX DG ABDOMEN 1V
1 series · 1 of 1 positions shown · non-contrast
Comparison: None.

CLINICAL DATA: OG tube positioning.

EXAM:
ABDOMEN - 1 VIEW

[abdomen kub]
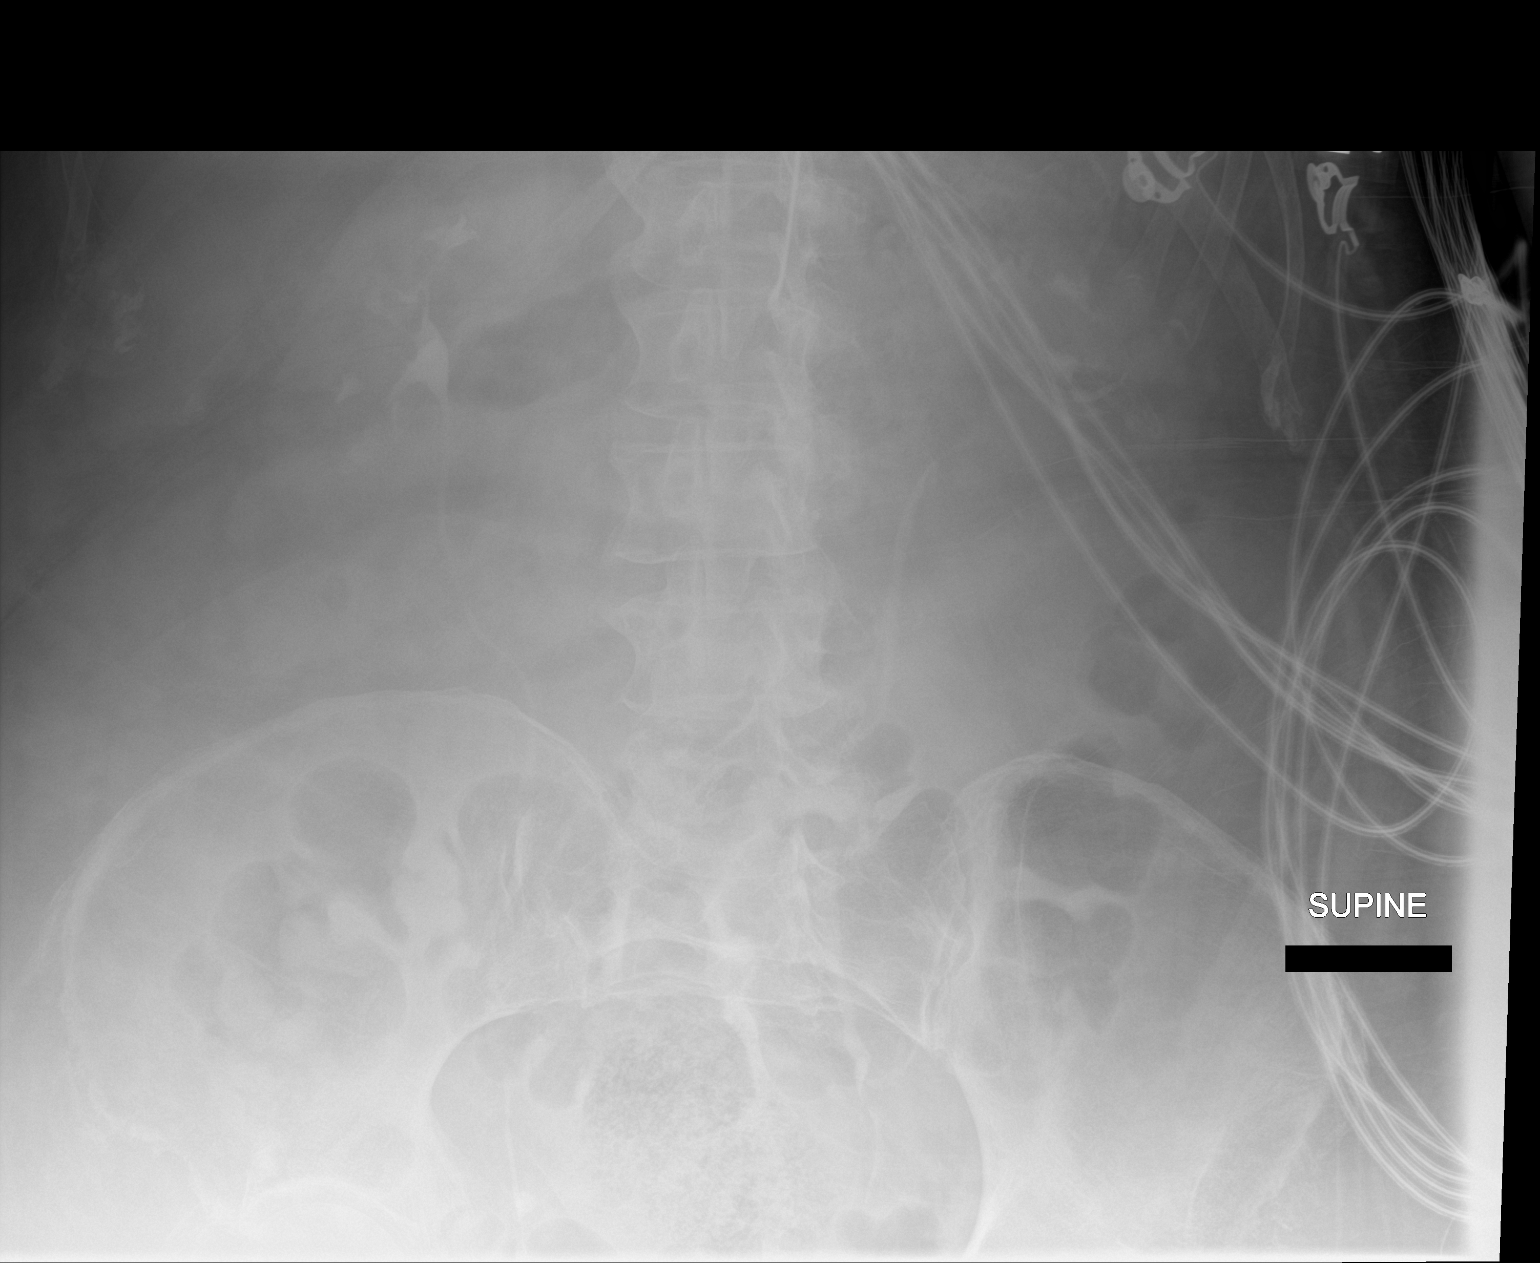

[1 of 1 positions shown; findings below may reference images not displayed]

FINDINGS: An orogastric tube is seen with its distal tip overlying the
expected region of the body of the stomach (overlying the superior
endplate of the L2 vertebral body). The bowel gas pattern is normal.
Radiopaque contrast is seen within the bilateral renal collecting
systems.
IMPRESSION: Orogastric tube positioning, as described above.

## 2021-03-29 IMAGING — CT CT HEAD W/O CM
4 series · 15 of 47 positions shown, 17 images · non-contrast
Comparison: [DATE]

CLINICAL DATA: Delirium, fatigue, altered mental status

EXAM:
CT HEAD WITHOUT CONTRAST
TECHNIQUE: Contiguous axial images were obtained from the base of the skull
through the vertex without intravenous contrast.

[Series 2: head wo · axial · 0.47mm/px · z∈[-231,-116]mm · 7 of 31 slices shown, 9 images]
[im 4/31  brain]
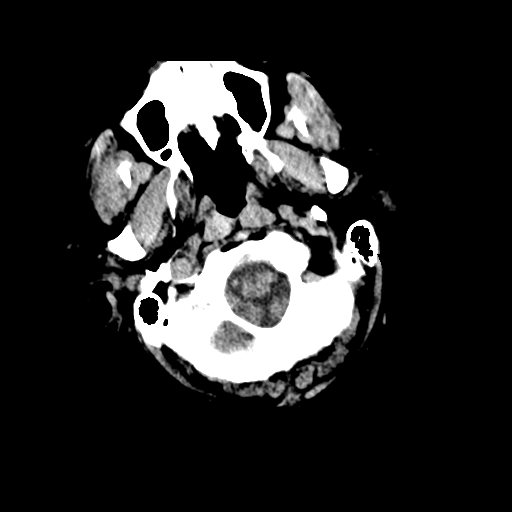
[im 4/31  bone]
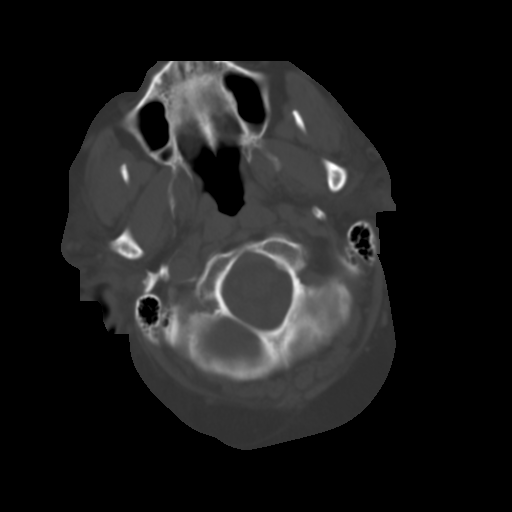
[im 8/31  brain]
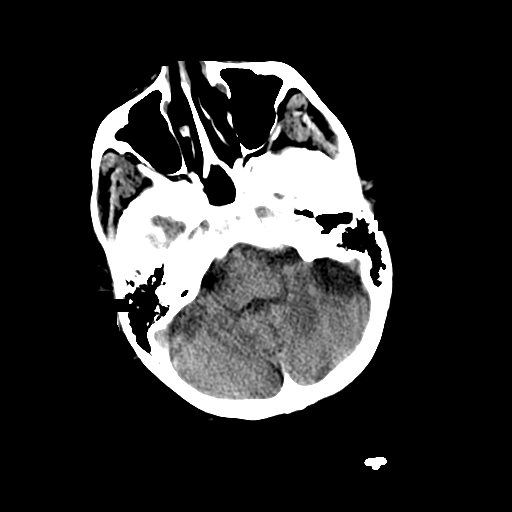
[im 12/31  brain]
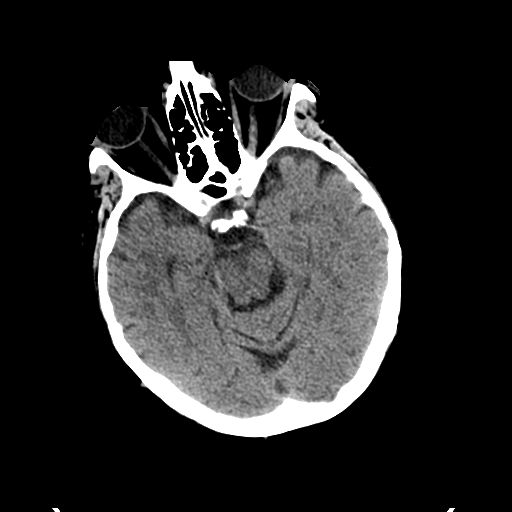
[im 16/31  brain]
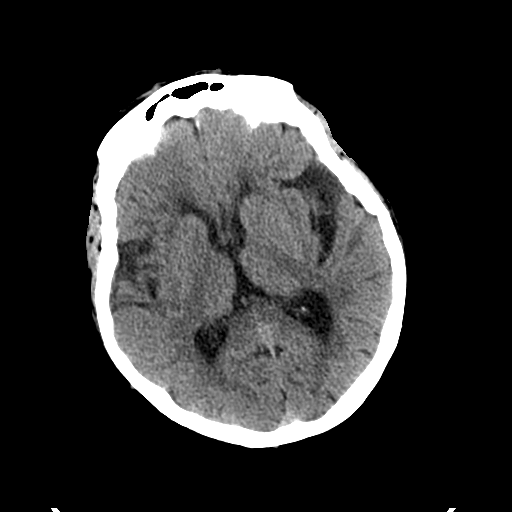
[im 19/31  brain]
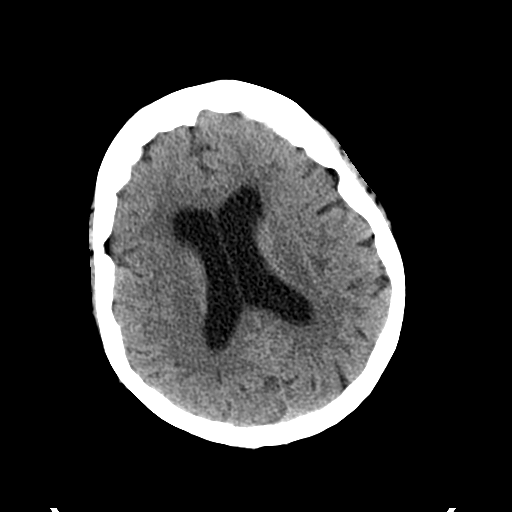
[im 19/31  bone]
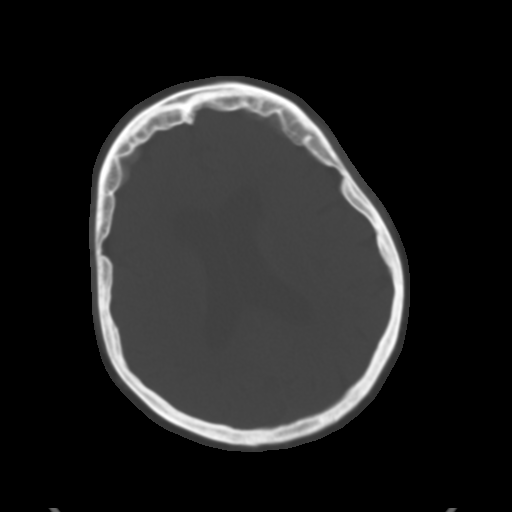
[im 23/31  brain]
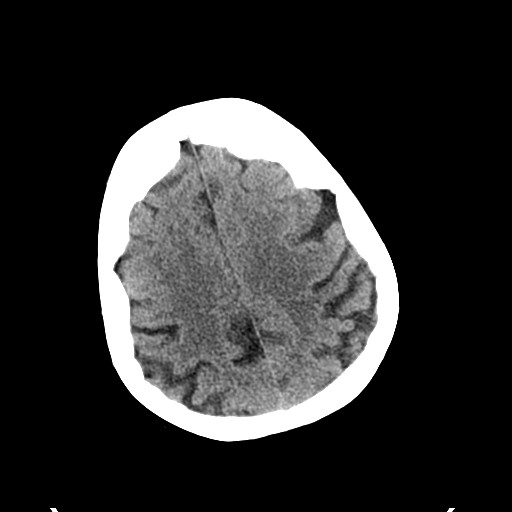
[im 27/31  brain]
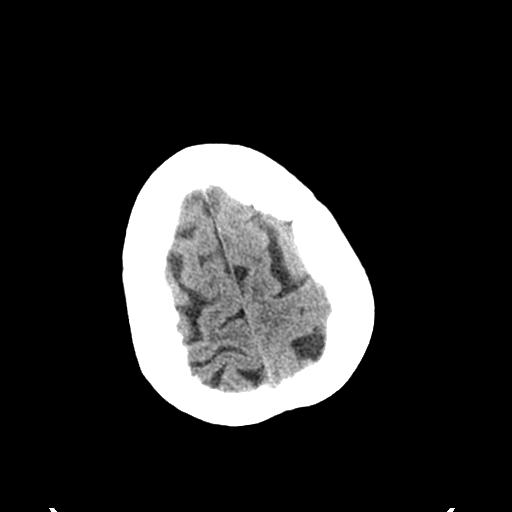

[Series 3: head bone · axial · 0.47mm/px · z∈[-232,-216]mm · 2 of 78 slices shown]
[im 8/78  bone]
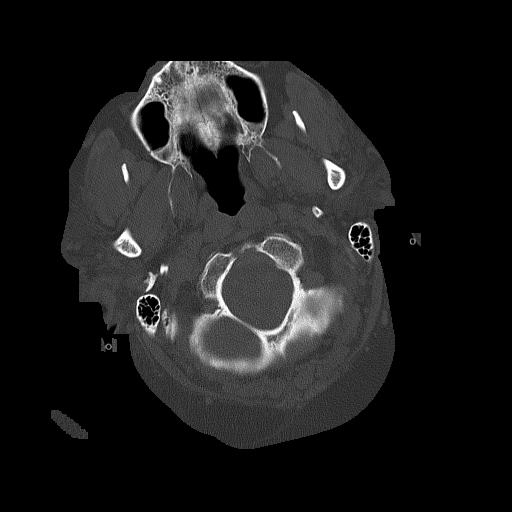
[im 16/78  bone]
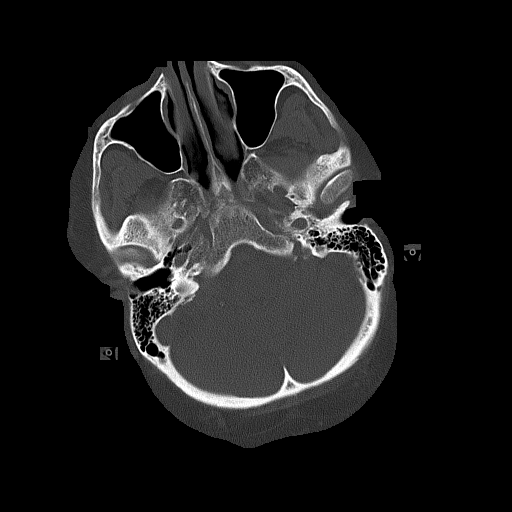

[Series 5: coronal soft tissue · coronal · 0.39mm/px · 3 of 65 slices shown]
[im 22/65  brain]
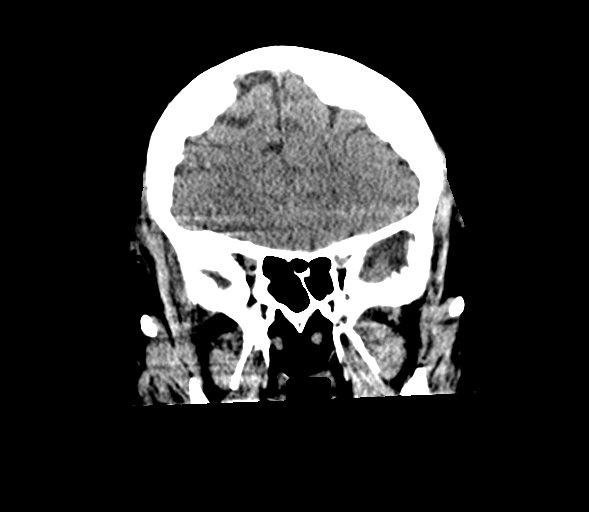
[im 29/65  brain]
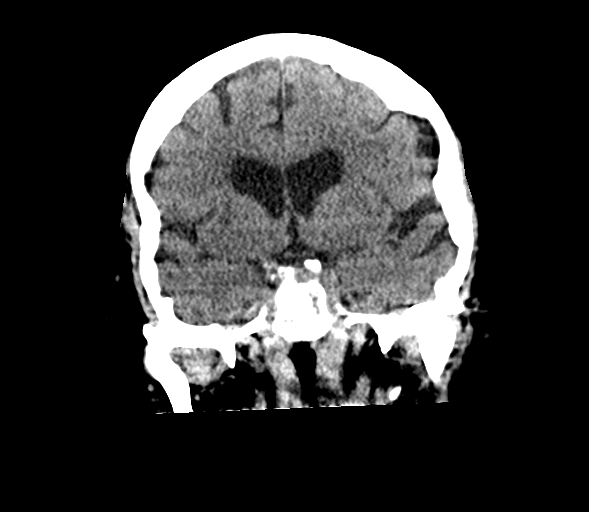
[im 36/65  brain]
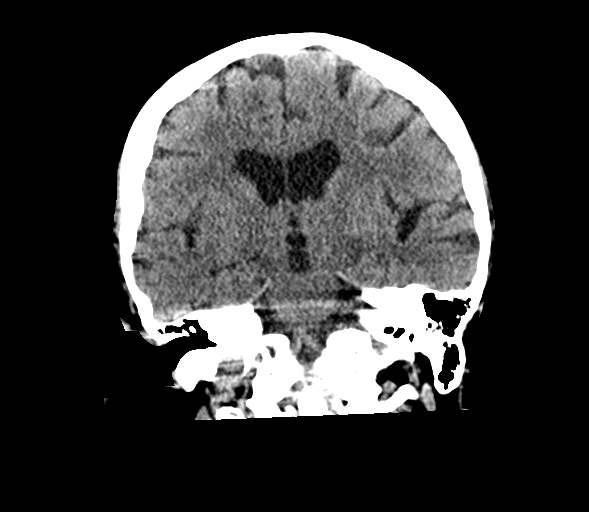

[Series 6: sagittal soft tissue · sagittal · 0.41mm/px · 3 of 51 slices shown]
[im 17/51  brain]
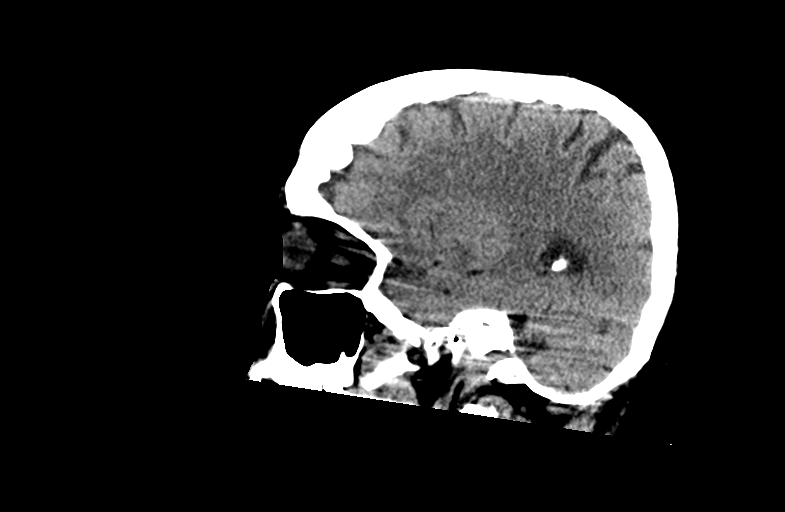
[im 26/51  brain]
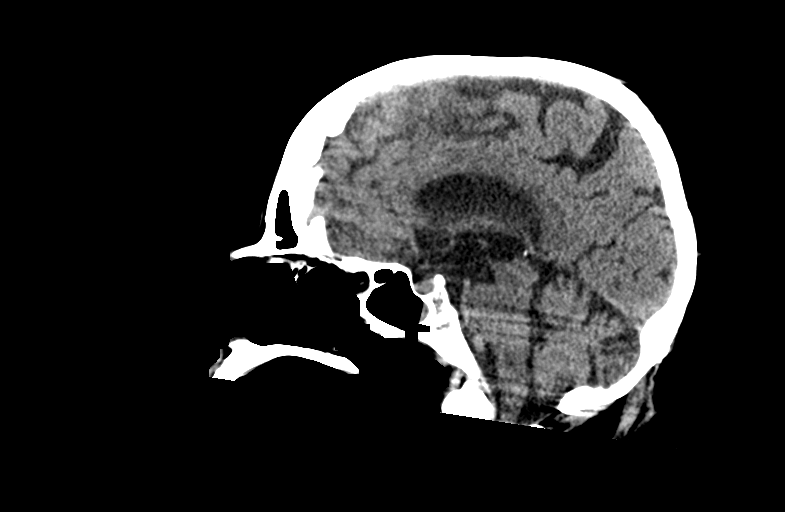
[im 34/51  brain]
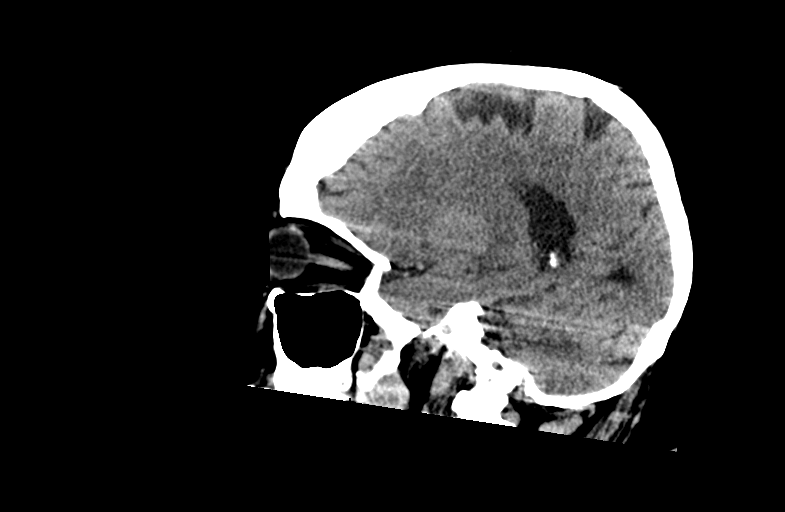

[15 of 47 positions shown; findings below may reference images not displayed]

FINDINGS: Brain: No evidence of acute infarction, hemorrhage, hydrocephalus,
extra-axial collection or mass lesion/mass effect. Periventricular
white matter changes, likely the sequela of chronic small vessel
ischemic disease.

Vascular: No hyperdense vessel or unexpected calcification.

Skull: Normal. Negative for fracture or focal lesion.

Sinuses/Orbits: Air-fluid level in the right sphenoid sinus. Mild
mucosal thickening in the ethmoid air cells. The orbits are
unremarkable.

Other: The mastoids are clear.
IMPRESSION: 1. No acute intracranial process.
2. Air-fluid level in the right sphenoid sinus, which can be seen in
the setting of acute sinusitis.

## 2021-03-29 IMAGING — CT CT ABD-PELV W/ CM
2 of 5 series · 15 of 46 positions shown, 17 images · IV contrast (omnipaque)
Comparison: [DATE].

CLINICAL DATA: Acute generalized abdominal pain.

EXAM:
CT ABDOMEN AND PELVIS WITH CONTRAST
TECHNIQUE: Multidetector CT imaging of the abdomen and pelvis was performed
using the standard protocol following bolus administration of
intravenous contrast.
CONTRAST:  80mL OMNIPAQUE IOHEXOL 350 MG/ML SOLN

[Series 2: axial st · axial · 0.88mm/px · z∈[+1127,+1557]mm · 12 of 102 slices shown, 14 images]
[im 8/102  soft-tissue]
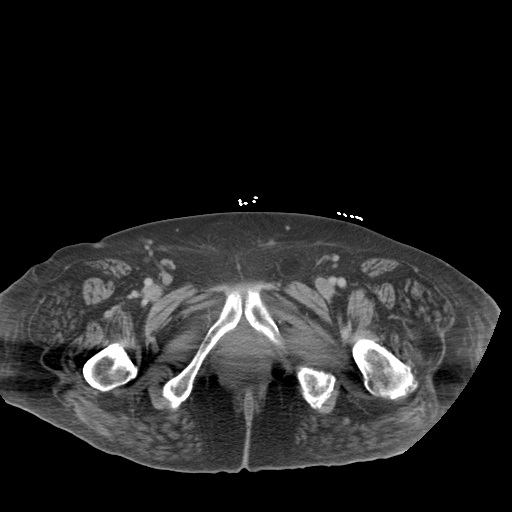
[im 8/102  bone]
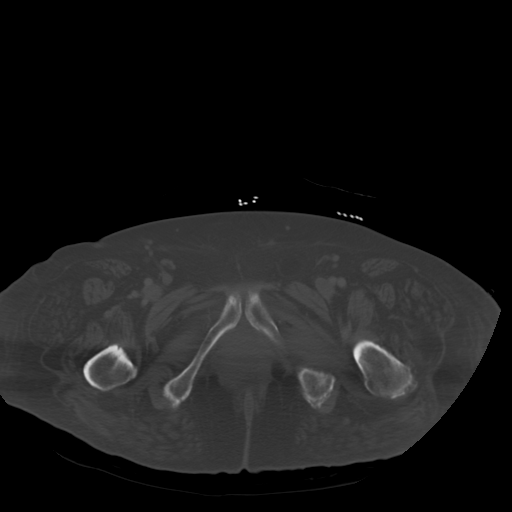
[im 16/102  soft-tissue]
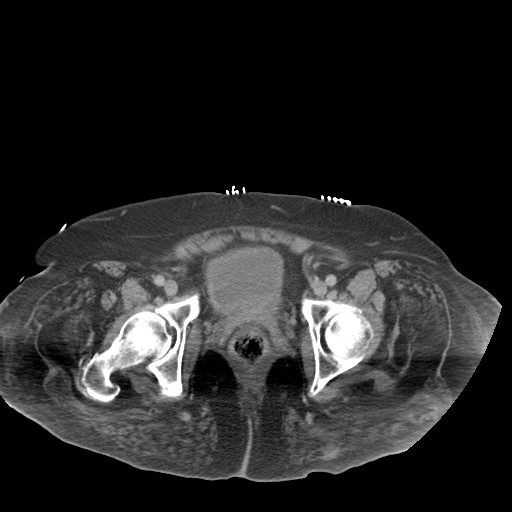
[im 24/102  soft-tissue]
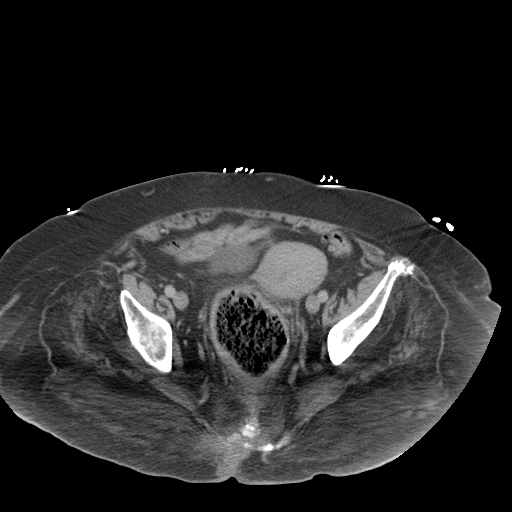
[im 32/102  soft-tissue]
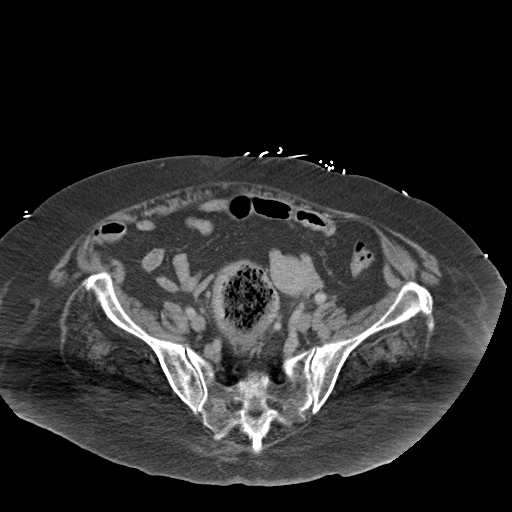
[im 39/102  soft-tissue]
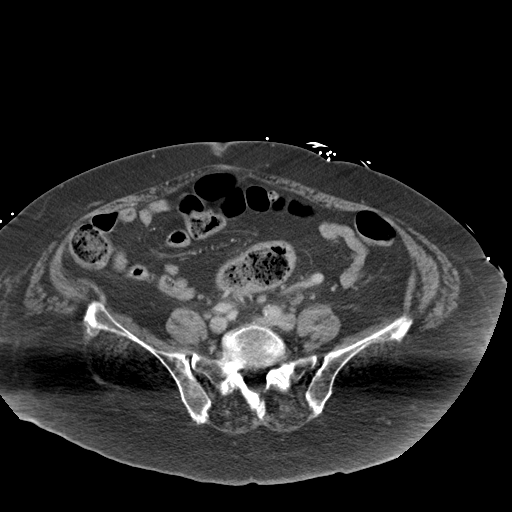
[im 47/102  soft-tissue]
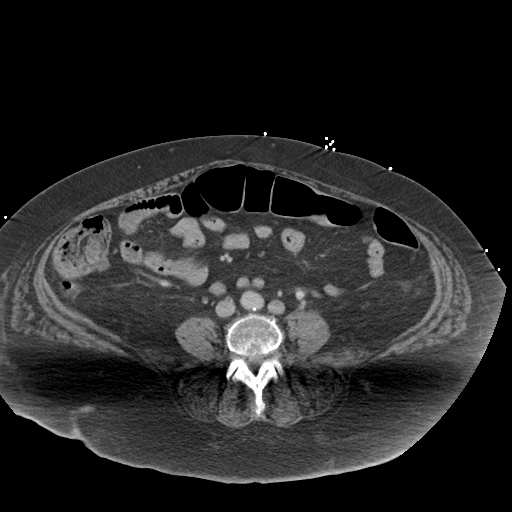
[im 55/102  soft-tissue]
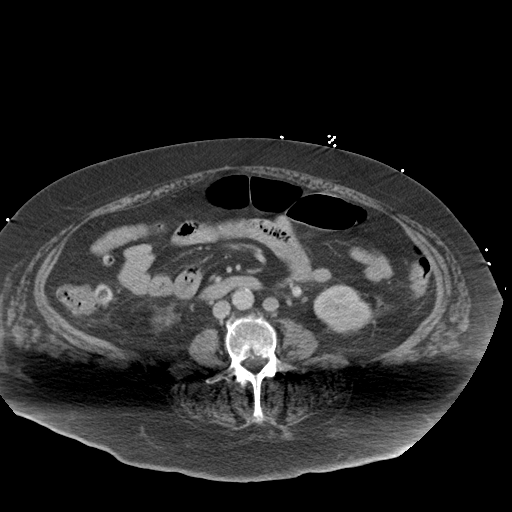
[im 63/102  soft-tissue]
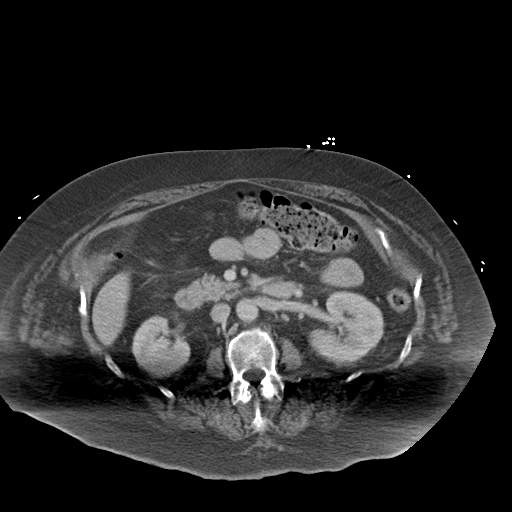
[im 70/102  soft-tissue]
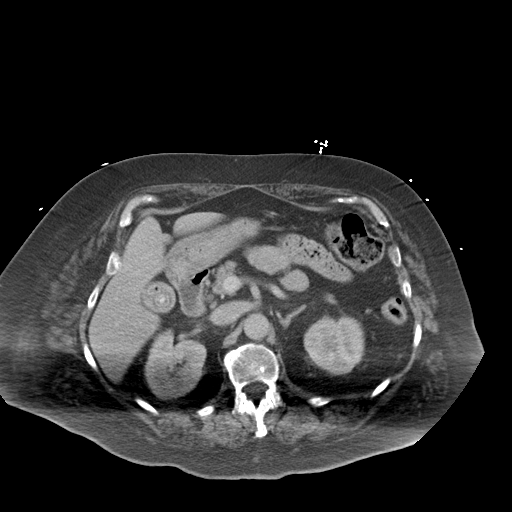
[im 70/102  bone]
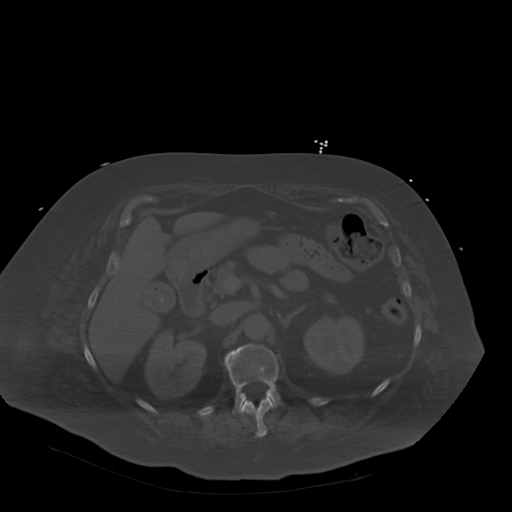
[im 78/102  soft-tissue]
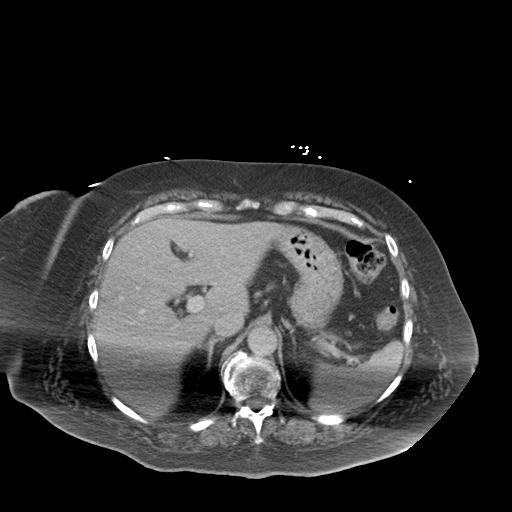
[im 86/102  soft-tissue]
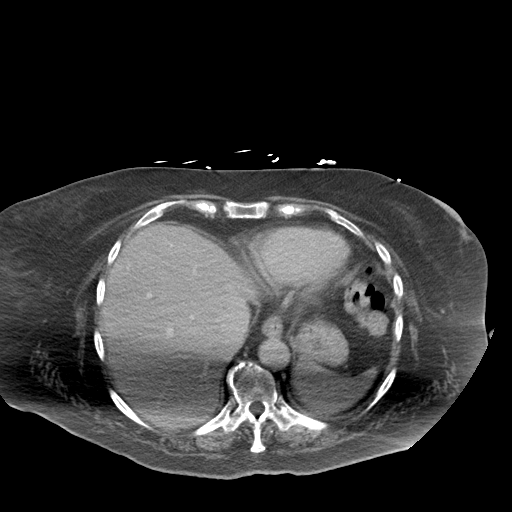
[im 94/102  soft-tissue]
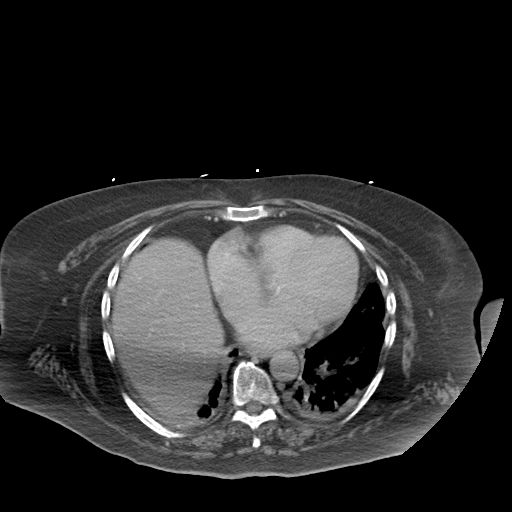

[Series 4: coronal st · coronal · 0.97mm/px · 3 of 161 slices shown]
[im 54/161  soft-tissue]
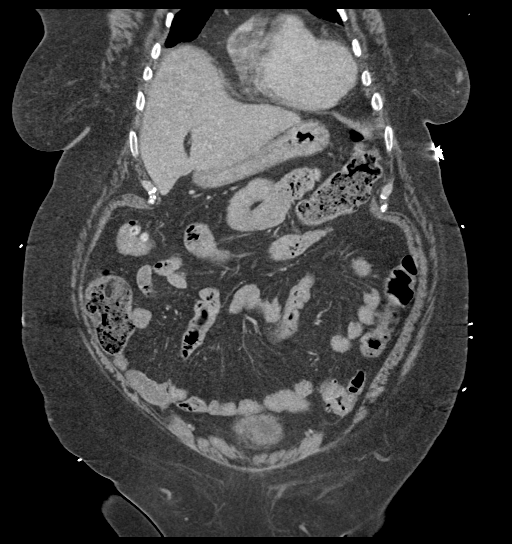
[im 72/161  soft-tissue]
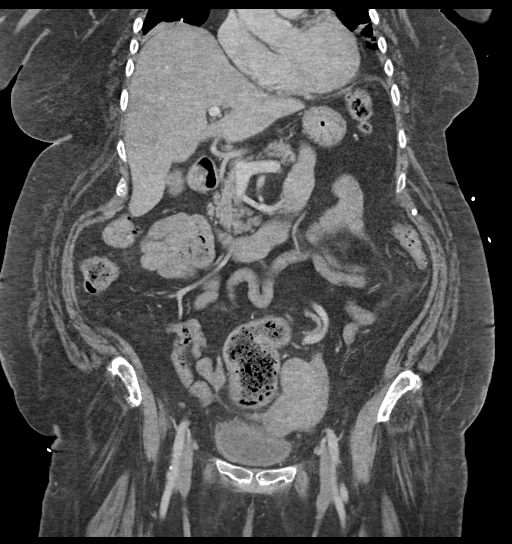
[im 89/161  soft-tissue]
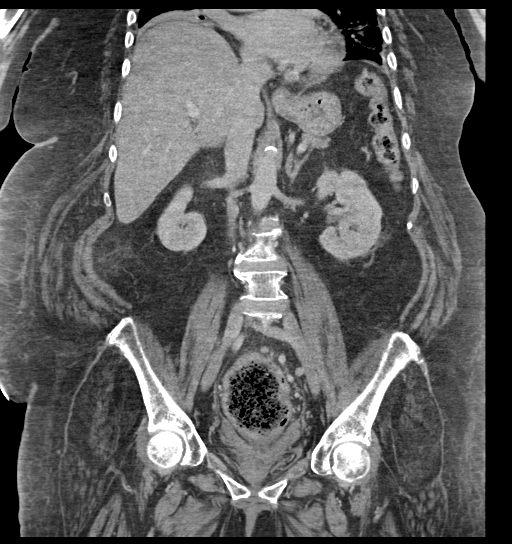

[15 of 46 positions shown; findings below may reference images not displayed]

FINDINGS: Lower chest: Mild bilateral posterior basilar subsegmental
atelectasis or infiltrates are noted with small bilateral pleural
effusions.

Hepatobiliary: Cholelithiasis is noted. No biliary dilatation is
noted. The liver is unremarkable.

Pancreas: Unremarkable. No pancreatic ductal dilatation or
surrounding inflammatory changes.

Spleen: Normal in size without focal abnormality.

Adrenals/Urinary Tract: Adrenal glands appear normal. Small
nonobstructive left renal calculus is noted. Right renal cyst is
noted. No hydronephrosis or renal obstruction is noted. Urinary
bladder is unremarkable.

Stomach/Bowel: Stomach appears normal. There is no definite evidence
of bowel obstruction. The appendix is not clearly visualized. Large
amount of stool seen in the distal sigmoid colon and rectum with
wall thickening suggesting some degree of proctocolitis.

Vascular/Lymphatic: Aortic atherosclerosis. No enlarged abdominal or
pelvic lymph nodes.

Reproductive: Stable uterine fibroids. No adnexal abnormality is
noted.

Other: Small fat containing left inguinal hernia is noted. No
ascites is noted.

Musculoskeletal: No acute or significant osseous findings.
IMPRESSION: Mild bilateral posterior basilar subsegmental atelectasis or
infiltrates are noted with small bilateral pleural effusions.

Cholelithiasis.

Large amount of stool seen in distal sigmoid colon and rectum with
wall thickening suggesting some degree of proctocolitis.

Small nonobstructive left renal calculus. No hydronephrosis or renal
obstruction is noted.

Stable uterine fibroids.

Small fat containing left inguinal hernia.

Aortic Atherosclerosis ([3F]-[3F]).

## 2021-03-29 IMAGING — DX DG CHEST 1V PORT
1 series · 1 of 1 positions shown · non-contrast
Comparison: [DATE] [DATE], [DATE] ([DATE] p.m.)

CLINICAL DATA: Endotracheal tube positioning.

EXAM:
PORTABLE CHEST 1 VIEW

[chest ap]
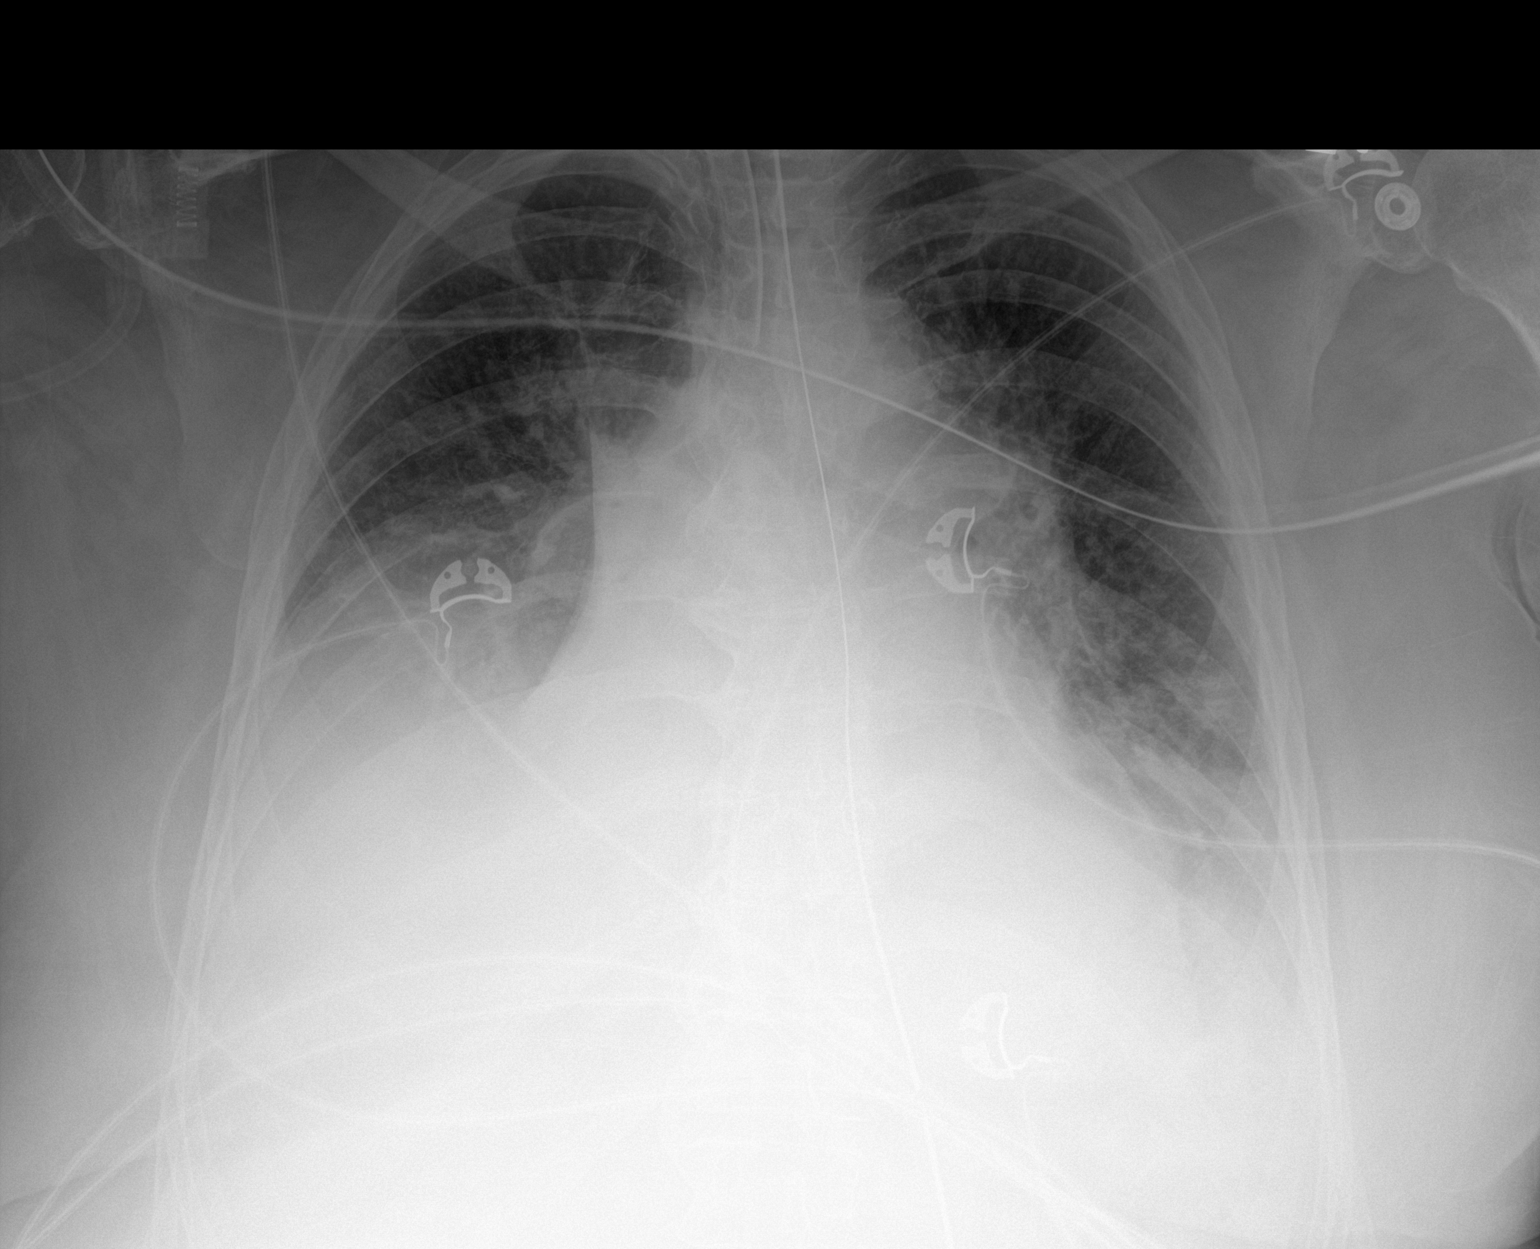

[1 of 1 positions shown; findings below may reference images not displayed]

FINDINGS: An endotracheal tube is seen with its distal tip approximately
cm from the carina. A nasogastric tube is noted with its distal end
extending below the level of the diaphragm. Mild to moderate
severity areas of atelectasis and/or infiltrate are seen within the
bilateral lung bases. There is no evidence of a pleural effusion or
pneumothorax. There is mild to moderate severity enlargement of the
cardiac silhouette. Degenerative changes seen throughout the
thoracic spine.
IMPRESSION: Endotracheal tube positioning, as described above.

## 2021-03-29 IMAGING — CR DG CHEST 2V
2 series · 2 of 2 positions shown · non-contrast
Comparison: [DATE]

CLINICAL DATA: Fatigue and dyspnea

EXAM:
CHEST - 2 VIEW

[w chest lat]
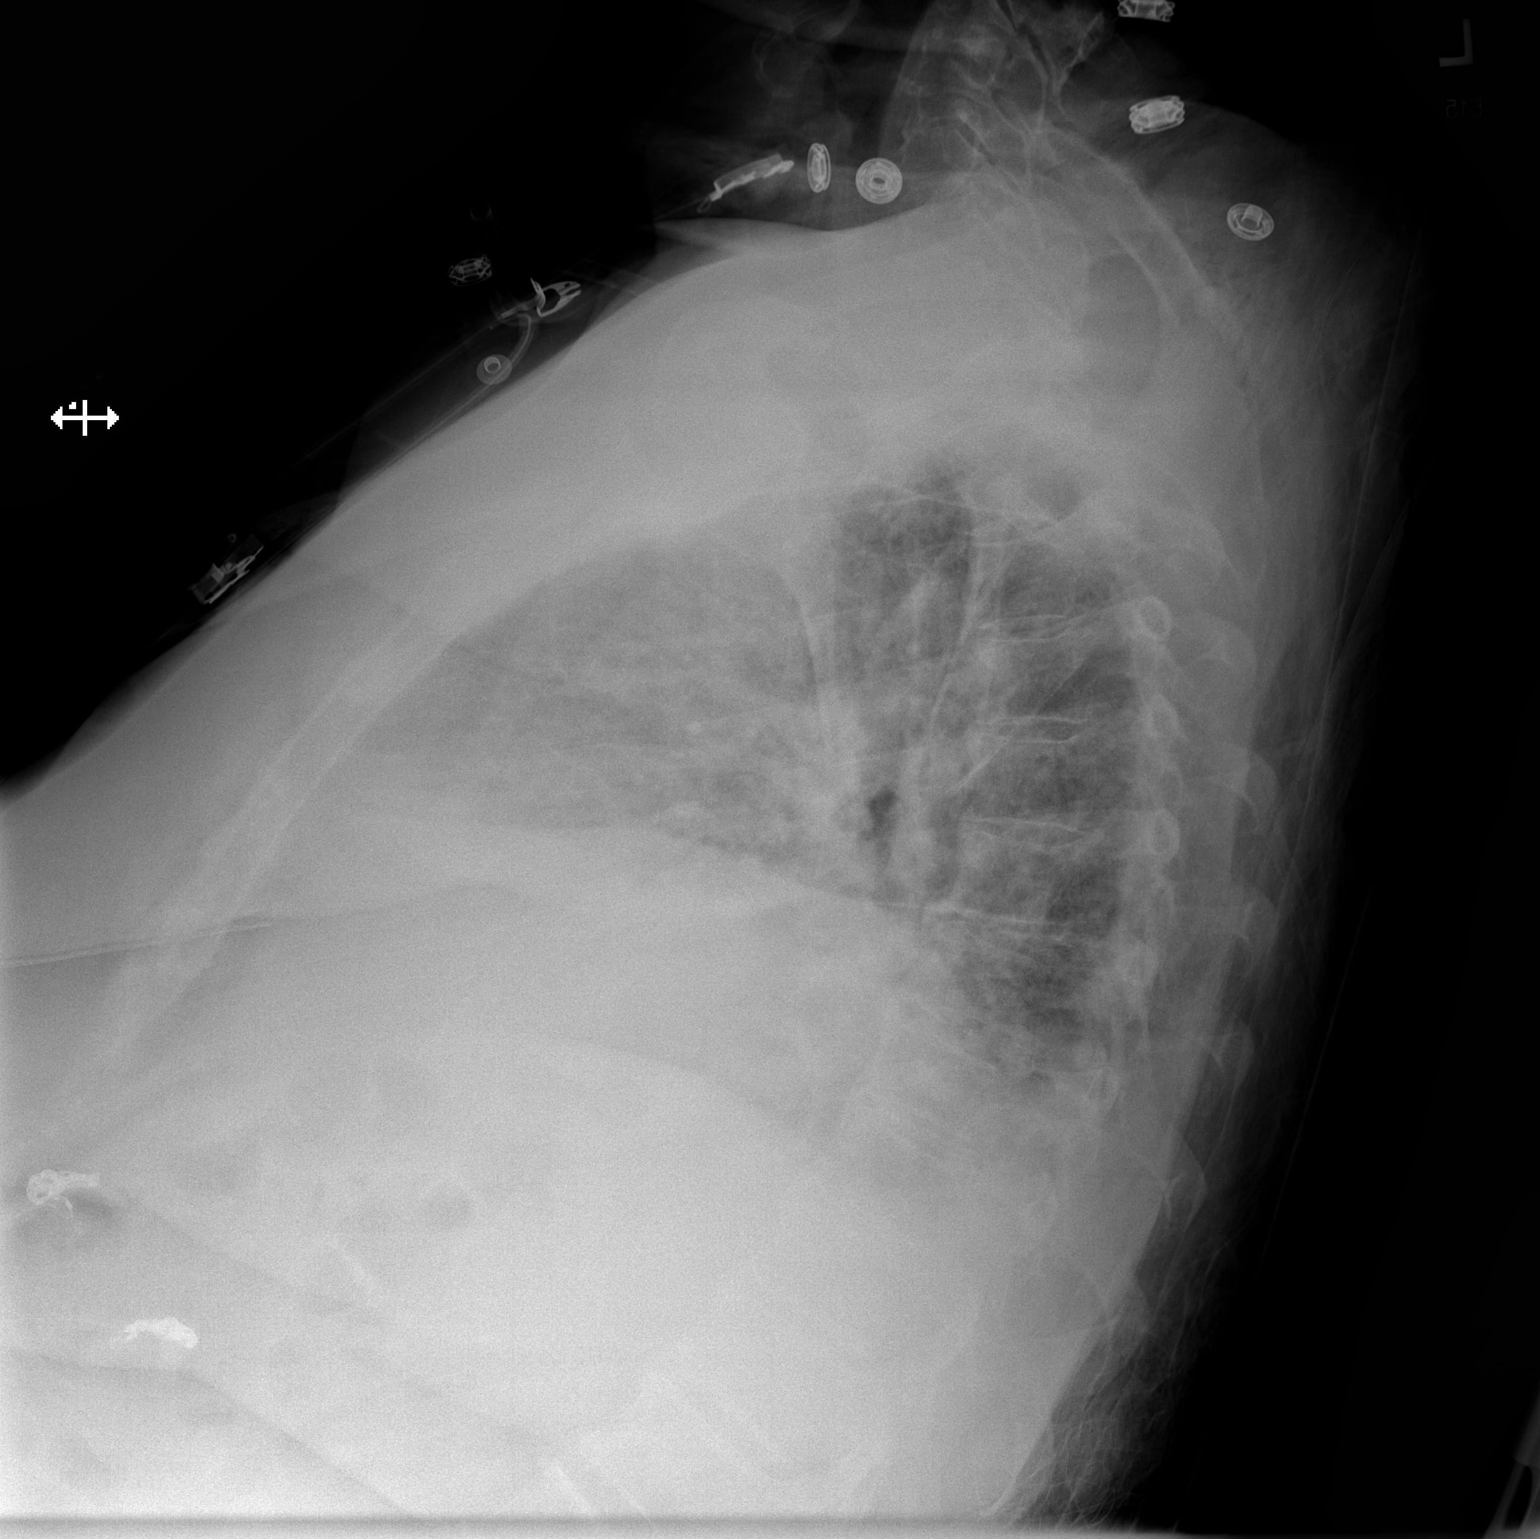

[x chest ap]
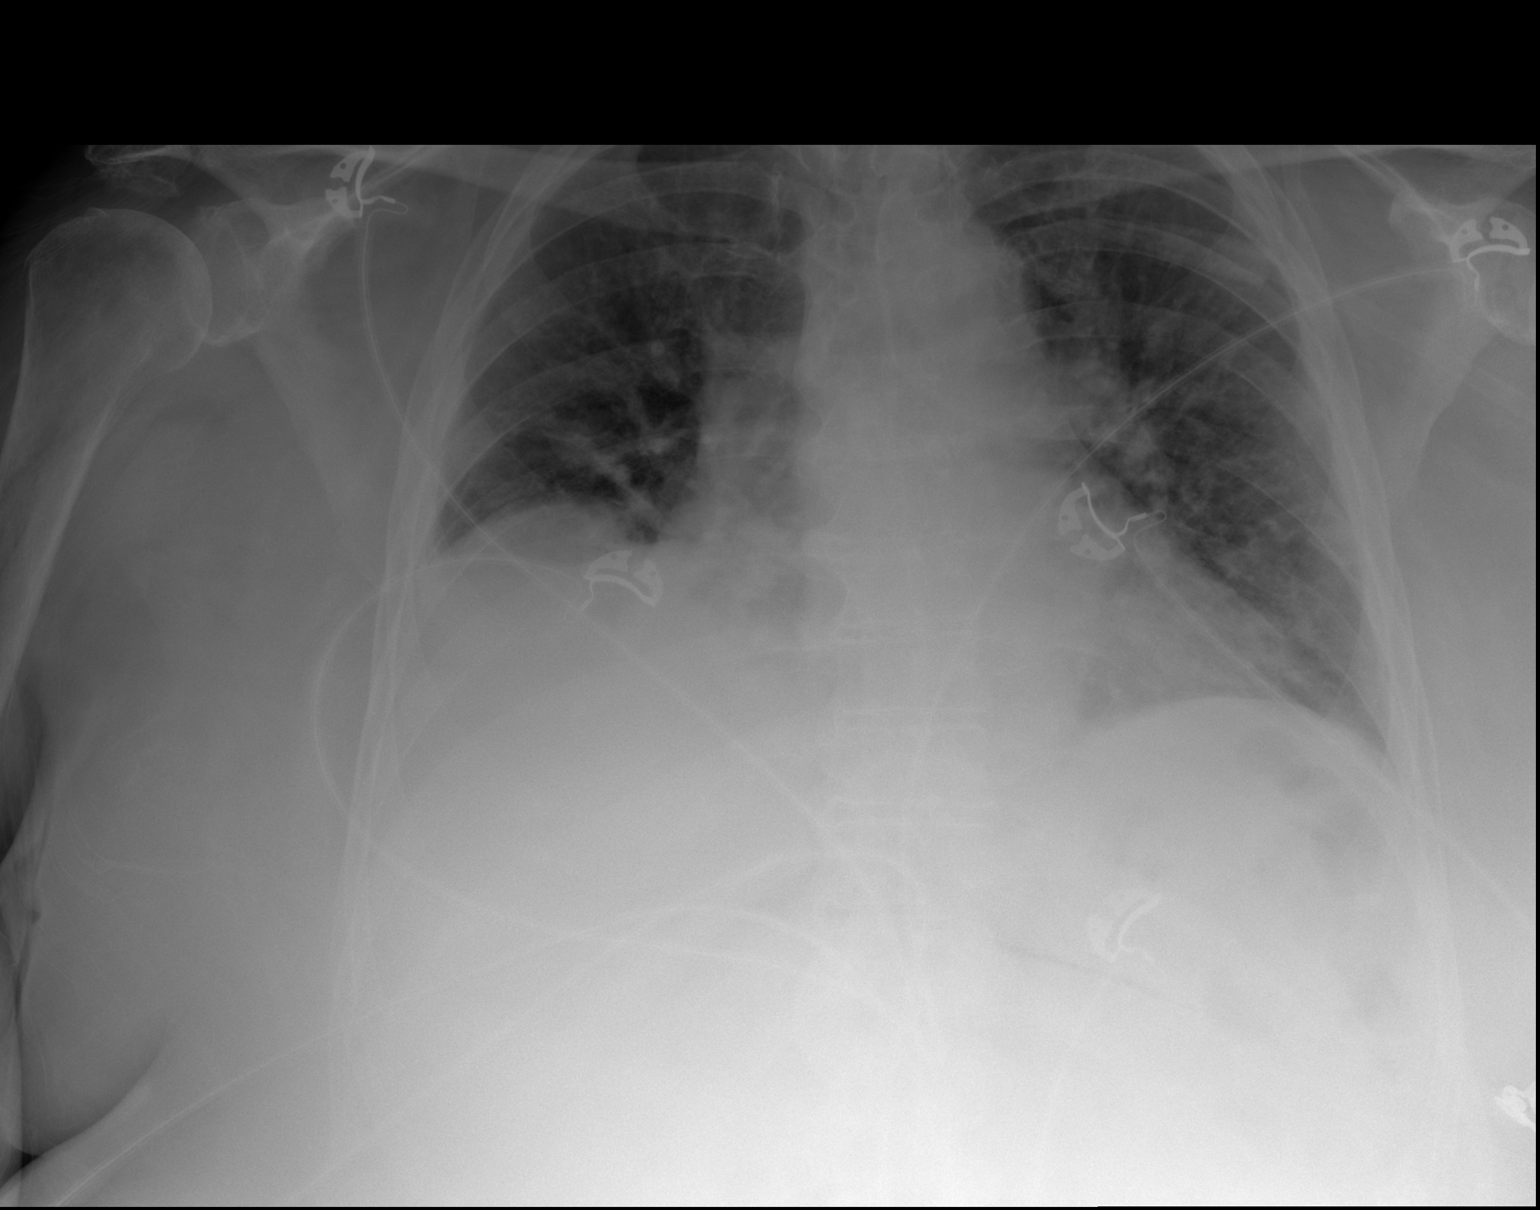

[2 of 2 positions shown; findings below may reference images not displayed]

FINDINGS: Cardiac shadow is mildly prominent but stable. Previously seen right
jugular catheter is been removed in the interval. The overall
inspiratory effort is poor. Mild central vascular congestion is
noted without definitive edema. No sizable effusion is seen.
IMPRESSION: Poor inspiratory effort.

Mild vascular congestion without edema.

## 2021-03-29 MED ORDER — PROPOFOL 1000 MG/100ML IV EMUL
0.0000 ug/kg/min | INTRAVENOUS | Status: DC
  Administered 2021-03-29 (×2): 5 ug/kg/min via INTRAVENOUS
  Filled 2021-03-29 (×3): qty 100

## 2021-03-29 MED ORDER — IPRATROPIUM-ALBUTEROL 0.5-2.5 (3) MG/3ML IN SOLN
3.0000 mL | Freq: Once | RESPIRATORY_TRACT | Status: AC
  Administered 2021-03-29: 3 mL via RESPIRATORY_TRACT
  Filled 2021-03-29: qty 3

## 2021-03-29 MED ORDER — SODIUM CHLORIDE 0.9 % IV BOLUS
2000.0000 mL | Freq: Once | INTRAVENOUS | Status: AC
  Administered 2021-03-29: 2000 mL via INTRAVENOUS

## 2021-03-29 MED ORDER — DOCUSATE SODIUM 100 MG PO CAPS: 100.0000 mg | ORAL_CAPSULE | Freq: Two times a day (BID) | ORAL | Status: DC | PRN

## 2021-03-29 MED ORDER — CHLORHEXIDINE GLUCONATE 0.12% ORAL RINSE (MEDLINE KIT): 15.0000 mL | Freq: Two times a day (BID) | OROMUCOSAL | Status: DC

## 2021-03-29 MED ORDER — FENTANYL CITRATE (PF) 100 MCG/2ML IJ SOLN
50.0000 ug | INTRAMUSCULAR | Status: DC | PRN
  Filled 2021-03-29: qty 2

## 2021-03-29 MED ORDER — ROCURONIUM BROMIDE 10 MG/ML (PF) SYRINGE
PREFILLED_SYRINGE | INTRAVENOUS | Status: AC
  Administered 2021-03-29: 90 mg via INTRAVENOUS
  Filled 2021-03-29: qty 10

## 2021-03-29 MED ORDER — PIPERACILLIN-TAZOBACTAM 3.375 G IVPB
3.3750 g | Freq: Three times a day (TID) | INTRAVENOUS | Status: DC
  Administered 2021-03-30: 3.375 g via INTRAVENOUS
  Filled 2021-03-29 (×2): qty 50

## 2021-03-29 MED ORDER — ROCURONIUM BROMIDE 50 MG/5ML IV SOLN
INTRAVENOUS | Status: AC | PRN
  Administered 2021-03-29: 90.7 mg via INTRAVENOUS

## 2021-03-29 MED ORDER — PANTOPRAZOLE SODIUM 40 MG IV SOLR
40.0000 mg | Freq: Every day | INTRAVENOUS | Status: DC
  Administered 2021-03-30 – 2021-03-31 (×3): 40 mg via INTRAVENOUS
  Filled 2021-03-29 (×3): qty 40

## 2021-03-29 MED ORDER — VANCOMYCIN HCL 1500 MG/300ML IV SOLN
1500.0000 mg | INTRAVENOUS | Status: DC
  Administered 2021-03-30: 1500 mg via INTRAVENOUS
  Filled 2021-03-29 (×2): qty 300

## 2021-03-29 MED ORDER — ROCURONIUM BROMIDE 50 MG/5ML IV SOLN
1.0000 mg/kg | Freq: Once | INTRAVENOUS | Status: AC
  Filled 2021-03-29: qty 9.07

## 2021-03-29 MED ORDER — ETOMIDATE 2 MG/ML IV SOLN
INTRAVENOUS | Status: AC | PRN
  Administered 2021-03-29: 20 mg via INTRAVENOUS

## 2021-03-29 MED ORDER — NALOXONE HCL 0.4 MG/ML IJ SOLN
0.4000 mg | Freq: Once | INTRAMUSCULAR | Status: AC
  Administered 2021-03-29: 0.4 mg via INTRAVENOUS
  Filled 2021-03-29: qty 1

## 2021-03-29 MED ORDER — POLYETHYLENE GLYCOL 3350 17 G PO PACK: 17.0000 g | PACK | Freq: Every day | ORAL | Status: DC | PRN

## 2021-03-29 MED ORDER — ENOXAPARIN SODIUM 40 MG/0.4ML IJ SOSY: 40.0000 mg | PREFILLED_SYRINGE | INTRAMUSCULAR | Status: DC

## 2021-03-29 MED ORDER — ORAL CARE MOUTH RINSE: 15.0000 mL | OROMUCOSAL | Status: DC

## 2021-03-29 MED ORDER — MIDAZOLAM HCL 2 MG/2ML IJ SOLN
INTRAMUSCULAR | Status: AC
  Filled 2021-03-29: qty 2

## 2021-03-29 MED ORDER — FENTANYL CITRATE (PF) 100 MCG/2ML IJ SOLN
50.0000 ug | INTRAMUSCULAR | Status: DC | PRN
Start: 2021-03-29 — End: 2021-03-30
  Administered 2021-03-29: 50 ug via INTRAVENOUS

## 2021-03-29 MED ORDER — IOHEXOL 350 MG/ML SOLN
80.0000 mL | Freq: Once | INTRAVENOUS | Status: AC | PRN
  Administered 2021-03-29: 80 mL via INTRAVENOUS

## 2021-03-29 MED ORDER — LACTATED RINGERS IV BOLUS
1000.0000 mL | Freq: Once | INTRAVENOUS | Status: AC
  Administered 2021-03-29: 1000 mL via INTRAVENOUS

## 2021-03-29 MED ORDER — FENTANYL 2500MCG IN NS 250ML (10MCG/ML) PREMIX INFUSION
0.0000 ug/h | INTRAVENOUS | Status: DC
  Administered 2021-03-29: 25 ug/h via INTRAVENOUS
  Administered 2021-03-31: 75 ug/h via INTRAVENOUS
  Filled 2021-03-29: qty 250

## 2021-03-29 MED ORDER — PIPERACILLIN-TAZOBACTAM 3.375 G IVPB 30 MIN
3.3750 g | Freq: Once | INTRAVENOUS | Status: AC
  Administered 2021-03-29: 3.375 g via INTRAVENOUS
  Filled 2021-03-29: qty 50

## 2021-03-29 MED ORDER — ONDANSETRON HCL 4 MG/2ML IJ SOLN: 4.0000 mg | Freq: Four times a day (QID) | INTRAMUSCULAR | Status: DC | PRN

## 2021-03-29 MED ORDER — VANCOMYCIN HCL IN DEXTROSE 1-5 GM/200ML-% IV SOLN
1000.0000 mg | Freq: Once | INTRAVENOUS | Status: AC
  Administered 2021-03-29: 1000 mg via INTRAVENOUS
  Filled 2021-03-29: qty 200

## 2021-03-29 MED ORDER — ETOMIDATE 2 MG/ML IV SOLN
0.3000 mg/kg | Freq: Once | INTRAVENOUS | Status: AC
  Administered 2021-03-29: 27.22 mg via INTRAVENOUS
  Filled 2021-03-29: qty 20

## 2021-03-29 NOTE — ED Triage Notes (Addendum)
Pt arrives via [facility] from [facility]. EMS reports pt is normally somewhat independent on ADLs and staff noted pt was increasingly lethargic today. Initial O2 sat was in the 70s on room air per EMS. Improved to 95% on 4 lpm via [place]. Unknown last known well per EMS.   EMS last VS - 147/100, HR 62, RR 22, 95% on 4 lpm via [place], CBG 139  #20 L AC

## 2021-03-29 NOTE — ED Notes (Signed)
Several warm blankets applied to patient

## 2021-03-29 NOTE — ED Provider Notes (Addendum)
[facility] DEPT Provider Note   CSN: [id number] Arrival date & time: 03/29/21  1550     History Chief Complaint  Patient presents with   Fatigue   Altered Mental Status   Hypoxia    Becky Hendricks is a 75 y.o. female.  75 yo female to ED with complaint of AMS, lethargy from nursing home.  Limited patient history 2/2 AMS. She will respond to questions with confused response. Reports that she was coughing up "blue" mucus.   Per EMS pt with fatigue/lethargy that has gotten progressively worse. Hypoxic and requiring supplemental oxygen. No home O2 use.   Pt altered, unable to provide significant history, D/w family Becky Hendricks, Becky Hendricks [phone]; progressive decline over last 2 weeks, more tired, difficulty completing ADL's. Has been in and out of rehab center over last 1.5 yrs 2/2 COVID, MI, respiratory issues, difficulty walking. She typically is able to feed herself but unable to bathe or ambulate.  Patient's brother confirms code status as FULL CODE.     Level 5 Caveat AMS  The history is provided by the EMS personnel and a relative. The history is limited by the condition of the patient. No language interpreter was used.  Altered Mental Status Presenting symptoms: confusion and lethargy   Severity:  Moderate Episode history:  Continuous Duration:  2 weeks Timing:  Constant Progression:  Worsening Chronicity:  New     Past Medical History:  Diagnosis Date   Atrial fibrillation ([place])    Bipolar disorder ([place])    Bipolar disorder ([place]) 04/27/2020   Clostridium difficile diarrhea 08/02/2019   Dehydration    Essential hypertension    Fall 02/11/2020   Morbid obesity ([place])    Osteomyelitis ([place]) 12/03/2019   Pneumonia due to COVID-19 virus 09/15/2019   Type 2 diabetes mellitus ([place])    Weakness     Patient Active Problem List   Diagnosis Date Noted   Acute respiratory failure with hypoxia ([place]) 03/29/2021   Hyponatremia 03/29/2021    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   CAP (community acquired pneumonia) 03/29/2021   Colitis 03/29/2021   Bipolar affective disorder, currently manic, mild ([place]) 11/19/2020   Constipation 11/18/2020   Encephalopathy 11/18/2020   MRSA bacteremia    SIRS (systemic inflammatory response syndrome) ([place]) 11/17/2020   Nausea and vomiting 11/17/2020   Mixed diabetic hyperlipidemia associated with type 2 diabetes mellitus ([place]) 11/17/2020   Type 2 diabetes mellitus with hyperglycemia, with long-term current use of insulin ([place]) 11/17/2020   Immunization due 07/26/2020   Psychosis ([place])    Bipolar disorder ([place]) 04/27/2020   Fall 02/11/2020   Chronic diastolic CHF (congestive heart failure) ([place]) 01/22/2020   Drug rash    Acute osteomyelitis of pelvic region and thigh ([place])    Palliative care by specialist    Goals of care, counseling/discussion    Edema of upper extremity    Hyperphosphatemia    Cardiac arrest ([place])    Sacral wound    Hypoxia    Severe sepsis ([place]) 12/03/2019   Abscess of multiple sites of buttock 12/03/2019   AF (paroxysmal atrial fibrillation) ([place]) 86/57/8469   Acute metabolic encephalopathy 62/95/2841   UTI (urinary tract infection), bacterial 09/15/2019   Pressure injury of skin 09/10/2019   Morbid obesity with BMI of 40.0-44.9, adult ([place]) 02/27/2018    Past Surgical History:  Procedure Laterality Date   INCISION AND DRAINAGE PERIRECTAL ABSCESS Left 12/04/2019   Procedure: IRRIGATION AND DEBRIDEMENT LEFT BUTTOCK ABSCESS;  Surgeon: Grandville Silos,  Lavone Neri, MD;  Location: [facility];  Service: General;  Laterality: Left;   INCISION AND DRAINAGE PERIRECTAL ABSCESS Left 12/10/2019   Procedure: REPEAT IRRIGATION AND DEBRIDEMENT BUTTOCK  ABSCESS;  Surgeon: Coralie Keens, MD;  Location: [facility];  Service: General;  Laterality: Left;   IR FLUORO GUIDE CV LINE RIGHT  11/25/2020   IR RADIOLOGIST EVAL & MGMT  12/29/2020   IR US GUIDE VASC ACCESS RIGHT  11/25/2020     OB  History   No obstetric history on file.     Family History  Family history unknown: Yes    Social History   Tobacco Use   Smoking status: Never   Smokeless tobacco: Never  Vaping Use   Vaping Use: Unknown  Substance Use Topics   Alcohol use: Not Currently   Drug use: Not Currently    Home Medications Prior to Admission medications   Medication Sig Start Date End Date Taking? Authorizing Provider  amLODipine (NORVASC) 10 MG tablet Take 1 tablet (10 mg total) by mouth daily. 12/03/20  Yes Hosie Poisson, MD  apixaban (ELIQUIS) 5 MG TABS tablet Take 5 mg by mouth 2 (two) times daily.   Yes [provider]  atorvastatin (LIPITOR) 10 MG tablet Take 10 mg by mouth daily.   Yes [provider]  calcium carbonate (TUMS - DOSED IN MG ELEMENTAL CALCIUM) 500 MG chewable tablet Chew 500 mg by mouth daily.   Yes [provider]  carvedilol (COREG) 6.25 MG tablet Take 1 tablet (6.25 mg total) by mouth 2 (two) times daily with a meal. 12/02/20  Yes Hosie Poisson, MD  Cholecalciferol (VITAMIN D) 125 MCG (5000 UT) CAPS Take 10,000 Units by mouth once a week. Wednesday   Yes [provider]  diphenhydrAMINE (BENADRYL) 2 % cream Apply 1 application topically 3 (three) times daily as needed for itching.   Yes [provider]  divalproex (DEPAKOTE) 500 MG DR tablet Take 500 mg by mouth 2 (two) times daily.   Yes [provider]  docusate sodium (COLACE) 100 MG capsule Take 1 capsule (100 mg total) by mouth 2 (two) times daily as needed for mild constipation. 01/05/20  Yes Regalado, Belkys A, MD  ferrous sulfate 324 MG TBEC Take 324 mg by mouth daily.   Yes [provider]  Ipratropium-Albuterol (COMBIVENT RESPIMAT) 20-100 MCG/ACT AERS respimat Inhale 1 puff into the lungs every 12 (twelve) hours.   Yes [provider]  JANUVIA 50 MG tablet Take 50 mg by mouth daily. 03/27/21  Yes [provider]  losartan (COZAAR) 25 MG tablet  Take 25 mg by mouth daily. Hold for SBP<100 for HTN 03/27/21  Yes [provider]  Multiple Vitamin (MULTIVITAMIN WITH MINERALS) TABS tablet Take 1 tablet by mouth daily.   Yes [provider]  pantoprazole (PROTONIX) 40 MG tablet Take 1 tablet (40 mg total) by mouth 2 (two) times daily. Patient taking differently: Take 40 mg by mouth daily. 01/05/20  Yes Regalado, Belkys A, MD  polyethylene glycol (MIRALAX / GLYCOLAX) 17 g packet Take 17 g by mouth 2 (two) times daily. Patient taking differently: Take 17 g by mouth daily. 12/02/20  Yes Hosie Poisson, MD  risperiDONE (RISPERDAL) 2 MG tablet Take 2 mg by mouth 2 (two) times daily. 03/01/21  Yes [provider]  sennosides-docusate sodium (SENOKOT-S) 8.6-50 MG tablet Take 2 tablets by mouth daily.   Yes [provider]  tamsulosin (FLOMAX) 0.4 MG CAPS capsule Take 1 capsule (0.4 mg  total) by mouth daily. Patient taking differently: Take 0.4 mg by mouth every other day. 12/03/20  Yes Hosie Poisson, MD  insulin aspart (NOVOLOG) 100 UNIT/ML injection CBG 70 - 120: 0 units CBG 121 - 150: 2 units CBG 151 - 200: 3 units CBG 201 - 250: 5 units CBG 251 - 300: 8 units CBG 301 - 350: 11 units CBG 351 - 400: 15 units 12/02/20   Hosie Poisson, MD    Allergies    Chlorhexidine gluconate, Vancomycin, and Acetazolamide er  Review of Systems   Review of Systems  Unable to perform ROS: Acuity of condition  Psychiatric/Behavioral:  Positive for confusion.    Physical Exam Updated Vital Signs BP (!) 80/45   Pulse (!) 50   Temp (!) 94.9 F (34.9 C)   Resp 18   Ht _0  (1.676 m)   Wt 90.7 kg   LMP  (LMP Unknown)   SpO2 100%   BMI 32.28 kg/m   Physical Exam Vitals and nursing note reviewed.  Constitutional:      General: She is not in acute distress.    Appearance: She is obese. She is ill-appearing and diaphoretic.  HENT:     Head: Normocephalic and atraumatic.     Right Ear: External ear normal.     Left Ear:  External ear normal.     Nose: Nose normal.     Mouth/Throat:     Mouth: Mucous membranes are dry.  Eyes:     General: No scleral icterus.    Extraocular Movements: Extraocular movements intact.     Conjunctiva/sclera: Conjunctivae normal.     Pupils: Pupils are equal, round, and reactive to light.     Comments: Pupil 29m reactive b/l   Cardiovascular:     Rate and Rhythm: Normal rate and regular rhythm.     Pulses: Normal pulses.     Heart sounds: Normal heart sounds.  Pulmonary:     Effort: Pulmonary effort is normal. No respiratory distress.     Breath sounds: Decreased air movement present. Decreased breath sounds present.  Abdominal:     General: Abdomen is flat.     Tenderness: There is no abdominal tenderness. There is no guarding or rebound.     Comments: Chronic sacral decubitus abnormality, does not appear to be acutely infected, no ulceration.   Musculoskeletal:        General: Normal range of motion.     Cervical back: Normal range of motion.     Right lower leg: No edema.     Left lower leg: No edema.     Comments: Contracture to hand   Skin:    General: Skin is warm.     Capillary Refill: Capillary refill takes less than 2 seconds.  Neurological:     Mental Status: She is lethargic and confused.     GCS: GCS eye subscore is 4. GCS verbal subscore is 4. GCS motor subscore is 5.     Motor: No seizure activity.     Comments: Not following commands, will withdraw from noxious stimuli to all 4 extremities. Moving extremities.   Psychiatric:        Mood and Affect: Mood normal.        Behavior: Behavior normal.    ED Results / Procedures / Treatments   Labs (all labs ordered are listed, but only abnormal results are displayed) Labs Reviewed  BRAIN NATRIURETIC PEPTIDE - Abnormal; Notable for the following components:  Result Value   B Natriuretic Peptide 118.6 (*)    All other components within normal limits  CBC WITH DIFFERENTIAL/PLATELET - Abnormal;  Notable for the following components:   WBC 12.7 (*)    Monocytes Absolute 1.6 (*)    Abs Immature Granulocytes 0.20 (*)    All other components within normal limits  COMPREHENSIVE METABOLIC PANEL - Abnormal; Notable for the following components:   Sodium 132 (*)    Chloride 87 (*)    CO2 36 (*)    Glucose, Bld 121 (*)    Calcium 8.6 (*)    Total Protein 6.3 (*)    Albumin 3.1 (*)    AST 9 (*)    All other components within normal limits  SALICYLATE LEVEL - Abnormal; Notable for the following components:   Salicylate Lvl <3.5 (*)    All other components within normal limits  URINALYSIS, ROUTINE W REFLEX MICROSCOPIC - Abnormal; Notable for the following components:   APPearance CLOUDY (*)    Specific Gravity, Urine 1.034 (*)    Nitrite POSITIVE (*)    Leukocytes,Ua LARGE (*)    WBC, UA >50 (*)    Bacteria, UA RARE (*)    All other components within normal limits  TSH - Abnormal; Notable for the following components:   TSH 4.559 (*)    All other components within normal limits  PHOSPHORUS - Abnormal; Notable for the following components:   Phosphorus 6.3 (*)    All other components within normal limits  CK - Abnormal; Notable for the following components:   Total CK 34 (*)    All other components within normal limits  BLOOD GAS, ARTERIAL - Abnormal; Notable for the following components:   pH, Arterial 7.084 (*)    pCO2 arterial 123 (*)    Bicarbonate 35.0 (*)    All other components within normal limits  RESP PANEL BY RT-PCR (FLU A&B, COVID) [id number]  CULTURE, BLOOD (SINGLE)  CULTURE, BLOOD (SINGLE)  CULTURE, BLOOD (ROUTINE X 2)  CULTURE, BLOOD (ROUTINE X 2)  URINE CULTURE  D-DIMER, QUANTITATIVE  LACTIC ACID, PLASMA  AMMONIA  RAPID URINE DRUG SCREEN, HOSP PERFORMED  VALPROIC ACID LEVEL  MAGNESIUM  SODIUM, URINE, RANDOM  CREATININE, URINE, RANDOM  T4, FREE  T3  PROCALCITONIN  OSMOLALITY, URINE  OSMOLALITY  CREATININE, SERUM  BLOOD GAS, ARTERIAL  TROPONIN I  (HIGH SENSITIVITY)  TROPONIN I (HIGH SENSITIVITY)    EKG EKG Interpretation  Date/Time:  Wednesday March 29 2021 16:09:09 EDT Ventricular Rate:  77 PR Interval:  181 QRS Duration: 84 QT Interval:  402 QTC Calculation: 455 R Axis:   33 Text Interpretation: Sinus rhythm Prolonged QT Similar to previous tracing Confirmed by Wynona Dove ([id number]) on 03/29/2021 4:30:33 PM  Radiology DG Chest 2 View  Result Date: 03/29/2021 CLINICAL DATA:  Fatigue and dyspnea EXAM: CHEST - 2 VIEW COMPARISON:  11/27/2020 FINDINGS: Cardiac shadow is mildly prominent but stable. Previously seen right jugular catheter is been removed in the interval. The overall inspiratory effort is poor. Mild central vascular congestion is noted without definitive edema. No sizable effusion is seen. IMPRESSION: Poor inspiratory effort. Mild vascular congestion without edema. Electronically Signed   By: Inez Catalina M.D.   On: 03/29/2021 17:28   DG Abd 1 View  Result Date: 03/29/2021 CLINICAL DATA:  OG tube positioning. EXAM: ABDOMEN - 1 VIEW COMPARISON:  None. FINDINGS: An orogastric tube is seen with its distal tip overlying the expected region of the body of  the stomach (overlying the superior endplate of the L2 vertebral body). The bowel gas pattern is normal. Radiopaque contrast is seen within the bilateral renal collecting systems. IMPRESSION: Orogastric tube positioning, as described above. Electronically Signed   By: Virgina Norfolk M.D.   On: 03/29/2021 22:23   CT HEAD WO CONTRAST (5MM)  Result Date: 03/29/2021 CLINICAL DATA:  Delirium, fatigue, altered mental status EXAM: CT HEAD WITHOUT CONTRAST TECHNIQUE: Contiguous axial images were obtained from the base of the skull through the vertex without intravenous contrast. COMPARISON:  11/21/2020 FINDINGS: Brain: No evidence of acute infarction, hemorrhage, hydrocephalus, extra-axial collection or mass lesion/mass effect. Periventricular white matter changes, likely the  sequela of chronic small vessel ischemic disease. Vascular: No hyperdense vessel or unexpected calcification. Skull: Normal. Negative for fracture or focal lesion. Sinuses/Orbits: Air-fluid level in the right sphenoid sinus. Mild mucosal thickening in the ethmoid air cells. The orbits are unremarkable. Other: The mastoids are clear. IMPRESSION: 1. No acute intracranial process. 2. Air-fluid level in the right sphenoid sinus, which can be seen in the setting of acute sinusitis. Electronically Signed   By: Merilyn Baba M.D.   On: 03/29/2021 16:56   CT ABDOMEN PELVIS W CONTRAST  Result Date: 03/29/2021 CLINICAL DATA:  Acute generalized abdominal pain. EXAM: CT ABDOMEN AND PELVIS WITH CONTRAST TECHNIQUE: Multidetector CT imaging of the abdomen and pelvis was performed using the standard protocol following bolus administration of intravenous contrast. CONTRAST:  7m OMNIPAQUE IOHEXOL 350 MG/ML SOLN COMPARISON:  November 17, 2020. FINDINGS: Lower chest: Mild bilateral posterior basilar subsegmental atelectasis or infiltrates are noted with small bilateral pleural effusions. Hepatobiliary: Cholelithiasis is noted. No biliary dilatation is noted. The liver is unremarkable. Pancreas: Unremarkable. No pancreatic ductal dilatation or surrounding inflammatory changes. Spleen: Normal in size without focal abnormality. Adrenals/Urinary Tract: Adrenal glands appear normal. Small nonobstructive left renal calculus is noted. Right renal cyst is noted. No hydronephrosis or renal obstruction is noted. Urinary bladder is unremarkable. Stomach/Bowel: Stomach appears normal. There is no definite evidence of bowel obstruction. The appendix is not clearly visualized. Large amount of stool seen in the distal sigmoid colon and rectum with wall thickening suggesting some degree of proctocolitis. Vascular/Lymphatic: Aortic atherosclerosis. No enlarged abdominal or pelvic lymph nodes. Reproductive: Stable uterine fibroids. No adnexal  abnormality is noted. Other: Small fat containing left inguinal hernia is noted. No ascites is noted. Musculoskeletal: No acute or significant osseous findings. IMPRESSION: Mild bilateral posterior basilar subsegmental atelectasis or infiltrates are noted with small bilateral pleural effusions. Cholelithiasis. Large amount of stool seen in distal sigmoid colon and rectum with wall thickening suggesting some degree of proctocolitis. Small nonobstructive left renal calculus. No hydronephrosis or renal obstruction is noted. Stable uterine fibroids. Small fat containing left inguinal hernia. Aortic Atherosclerosis (ICD10-I70.0). Electronically Signed   By: JMarijo ConceptionM.D.   On: 03/29/2021 18:40   DG Chest Port 1 View  Result Date: 03/29/2021 CLINICAL DATA:  Endotracheal tube positioning. EXAM: PORTABLE CHEST 1 VIEW COMPARISON:  March 29, 2021 (5:07 p.m.) FINDINGS: An endotracheal tube is seen with its distal tip approximately 2.5 cm from the carina. A nasogastric tube is noted with its distal end extending below the level of the diaphragm. Mild to moderate severity areas of atelectasis and/or infiltrate are seen within the bilateral lung bases. There is no evidence of a pleural effusion or pneumothorax. There is mild to moderate severity enlargement of the cardiac silhouette. Degenerative changes seen throughout the thoracic spine. IMPRESSION: Endotracheal tube positioning, as described  above. Electronically Signed   By: Virgina Norfolk M.D.   On: 03/29/2021 22:25    Procedures .Critical Care  Date/Time: 03/29/2021 7:24 PM Performed by: Jeanell Sparrow, DO Authorized by: Jeanell Sparrow, DO   Critical care provider statement:    Critical care time (minutes):  45   Critical care time was exclusive of:  Separately billable procedures and treating other patients   Critical care was necessary to treat or prevent imminent or life-threatening deterioration of the following conditions:  Sepsis and  respiratory failure   Critical care was time spent personally by me on the following activities:  Discussions with consultants, evaluation of patient's response to treatment, examination of patient, ordering and performing treatments and interventions, ordering and review of laboratory studies, ordering and review of radiographic studies, pulse oximetry, re-evaluation of patient's condition, obtaining history from patient or surrogate and review of old charts   Care discussed with: admitting provider   Procedure Name: Intubation Date/Time: 03/29/2021 10:02 PM Performed by: Jeanell Sparrow, DO Pre-anesthesia Checklist: Patient identified, Emergency Drugs available, Suction available, Patient being monitored and Timeout performed Oxygen Delivery Method: Ambu bag Preoxygenation: Pre-oxygenation with 100% oxygen Induction Type: Rapid sequence Ventilation: Mask ventilation without difficulty Laryngoscope Size: Mac, 3 and Glidescope Tube size: 7.0 mm Number of attempts: 1 Airway Equipment and Method: Stylet Placement Confirmation: ETT inserted through vocal cords under direct vision, CO2 detector and Breath sounds checked- equal and bilateral Secured at: 23 cm Tube secured with: ETT holder Dental Injury: Teeth and Oropharynx as per pre-operative assessment  Difficulty Due To: Difficulty was anticipated      Medications Ordered in ED Medications  vancomycin (VANCOCIN) IVPB 1000 mg/200 mL premix (1,000 mg Intravenous New Bag/Given 03/29/21 2113)  piperacillin-tazobactam (ZOSYN) IVPB 3.375 g (has no administration in time range)  vancomycin (VANCOREADY) IVPB 1500 mg/300 mL (has no administration in time range)  fentaNYL (SUBLIMAZE) injection 50 mcg (has no administration in time range)  fentaNYL (SUBLIMAZE) injection 50 mcg (50 mcg Intravenous Given 03/29/21 2201)  propofol (DIPRIVAN) 1000 MG/100ML infusion (0 mcg/kg/min  90.7 kg Intravenous Paused 03/29/21 2223)  fentaNYL 2530mg in NS 2510m (1032mml) infusion-PREMIX (0 mcg/hr Intravenous Hold 03/29/21 2252)  ipratropium-albuterol (DUONEB) 0.5-2.5 (3) MG/3ML nebulizer solution 3 mL (3 mLs Nebulization Given 03/29/21 1714)  ipratropium-albuterol (DUONEB) 0.5-2.5 (3) MG/3ML nebulizer solution 3 mL (3 mLs Nebulization Given 03/29/21 1805)  iohexol (OMNIPAQUE) 350 MG/ML injection 80 mL (80 mLs Intravenous Contrast Given 03/29/21 1824)  lactated ringers bolus 1,000 mL (1,000 mLs Intravenous Bolus 03/29/21 1900)  piperacillin-tazobactam (ZOSYN) IVPB 3.375 g (3.375 g Intravenous New Bag/Given 03/29/21 2019)  naloxone (NA[facility] Cent[facility]jection 0.4 mg (0.4 mg Intravenous Given 03/29/21 2056)  etomidate (AMIDATE) injection 27.22 mg (27.22 mg Intravenous Given 03/29/21 2152)  rocuronium (ZEMURON) injection 90.7 mg (90.7 mg Intravenous Not Given 03/29/21 2228)  etomidate (AMIDATE) injection (20 mg Intravenous Given 03/29/21 2152)  rocuronium (ZEMURON) injection (90.7 mg Intravenous Given 03/29/21 2152)  sodium chloride 0.9 % bolus 2,000 mL (2,000 mLs Intravenous New Bag/Given 03/29/21 2241)    ED Course  I have reviewed the triage vital signs and the nursing notes.  Pertinent labs & imaging results that were available during my care of the patient were reviewed by me and considered in my medical decision making (see chart for details).    MDM Rules/Calculators/A&P  75 yo female with history as above to ED for AMS, hypoxia. Vitals reviewed and are stable, she is on  currently. She will respond to questions with confused response. Reports that she was coughing up "blue" mucus. Vitals reviewed. Serious etiology considered.   Patient with very similar presentation in 11/2020, presumed to be 2/2 MRSA bacteremia. Treated with ABX, hospitalized and eventually d/c'd to SNF.  This patient complains of AMS, hypoxia; this involves an extensive number of treatment Options and is a complaint that carries with it a high risk of complications  and Morbidity. Serious etiologies considered.   I ordered, reviewed and interpreted labs, which included mild leukocytosis on CBC, CMP with mild hyponatermia. ECG without acute ischemia, troponin were negative. LA not elevated. Tox screening labs were stable. Depakote level is therapeutic.   I ordered medication Vancomycin and Zosyn, IVF, nebulized breathing treatments.   I ordered imaging studies which included CT H, CT a/p, CXR and I independently visualized and interpreted imaging which showed possible bilateral LL PNA, cholelithiasis, proctocolitis possibly.   Additional history obtained from brother, EMS Previous records obtained and reviewed     Critical Interventions: broad spectrum antibiotics, IVF, oxygen support. D/w pharmacy regarding possible vancomycin allergy. Discussed reducing infusion rate and supportive medications PRN.    After the interventions stated above, I reevaluated the patient and found patient remains altered, she is protecting her airway and has improved on NRB as she seems to primarily be a "mouth breather." Labs similar to prior visit. Recommend admission for broad spectrum ABX for presumed PNA and colitis. Discussed with [facility] admission team who accepts patient.  Urinalysis remains pending.    9:57 PM Following admission patient becoming increasingly lethargic, not protecting her airway. Blood gas resulted with severe hypercarbia. Mental status worsened, GCS is diminshing. Plan for emergent intubation as pt FULL CODE. Hospitalist notified. Family updated.  Will d/w crit care who accepts patient to ICU.  CXR reviewed after intubation, ETT in good position.   Patient hypotensive s/p intubation, will give IVF, low threshold to start vasopressors.     Final Clinical Impression(s) / ED Diagnoses Final diagnoses:  Pneumonia of lower lobe due to infectious organism, unspecified laterality  Colitis  Metabolic encephalopathy  Encounter for orogastric (OG) tube  placement  Urinary tract infection without hematuria, site unspecified  Acute respiratory failure with hypoxia and hypercapnia [facility])    Rx / DC Orders ED Discharge Orders     None        Jeanell Sparrow, DO 03/29/21 [place], [place], DO 03/29/21 2312

## 2021-03-29 NOTE — Progress Notes (Signed)
A consult was received from an ED physician for vancomycin and Zosyn per pharmacy dosing (for an indication other than meningitis). The patient's profile has been reviewed for ht/wt/allergies/indication/available labs. A one time order has been placed for the above antibiotics.  Further antibiotics/pharmacy consults should be ordered by admitting physician if indicated.                       Bernadene Person, PharmD, BCPS [phone] 03/29/2021, 7:16 PM

## 2021-03-29 NOTE — ED Notes (Signed)
Bear hugger applied 

## 2021-03-29 NOTE — ED Notes (Signed)
Pt placed on NRB @ 10 lpm by Dr. Wallace Cullens for low saturation while sleeping.

## 2021-03-29 NOTE — ED Notes (Addendum)
Gwenette Greet, PA aware of patient BP and heart rate. PA was at bedside. New orders provided.

## 2021-03-29 NOTE — Plan of Care (Signed)
Becky Hendricks BMW:[id number] DOB: 19-Jul-1946 DOA: 03/29/2021     PCP: Roderic Scarce, MD   Outpatient Specialists:   Patient arrived to ER on 03/29/21 at 1550 Referred by Attending Sloan Leiter, DO   Patient coming from:   From facility [name]  Chief Complaint:   Chief Complaint  Patient presents with   Fatigue   Altered Mental Status   Hypoxia    HPI: Becky Hendricks is a 75 y.o. 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 MRI showed possible occipital lobe changes at the time EEG was negative neurology was also consulted MRI changes for possible PRES  She was treated for MRSA bacteremia and discharged with daptomycin to finish on 20 Dec 2020 Has been vaccinated against COVID            Initial COVID TEST  NEGATIVE   Lab Results  Component Value Date   SARSCOV2NAA NEGATIVE 03/29/2021   SARSCOV2NAA NEGATIVE 12/02/2020   SARSCOV2NAA NEGATIVE 11/17/2020   SARSCOV2NAA NEGATIVE 05/13/2020     Regarding pertinent Chronic problems  HLD on Lipitor    HTN on amlodipine Coreg  chronic CHF diastolic  - last echo April 2022 showed diastolic dysfunction     DM 2 -  Lab Results  Component Value Date   HGBA1C 6.1 (H) 11/18/2020   on insulin,       obesity-   BMI Readings from Last 1 Encounters:  03/29/21 32.28 kg/m   A. Fib -  - CHA2DS2 vas score    5      current  on  anticoagulation with Eliquis,           -  Rate control:  Currently controlled with  Coreg          Chronic anemia - baseline hg Hemoglobin & Hematocrit  Recent Labs    11/29/20 0505 11/30/20 0500 03/29/21 1614  HGB 10.7* 10.7* 12.2     While in ER: CT head neg CT abd colitis and PNA Hx of MRSA Started on vanc and zosyn     ED Triage Vitals  Enc Vitals Group     BP 03/29/21 1602 127/67     Pulse Rate 03/29/21 1602 67     Resp 03/29/21 1602 16     Temp 03/29/21 1602 98.4 F (36.9 C)     Temp Source 03/29/21 1602 Oral     SpO2 03/29/21 1556 95 %     Weight 03/29/21 1602 200 lb (90.7 kg)     Height 03/29/21 1602 5\' 6"  (1.676 m)     Head Circumference --      Peak Flow --      Pain Score --      Pain Loc --      Pain Edu? --      Excl. in GC? --   TMAX(24)@     _________________________________________ Significant initial  Findings: Abnormal Labs Reviewed  BRAIN NATRIURETIC PEPTIDE - Abnormal; Notable for the following components:      Result Value   B Natriuretic Peptide 118.6 (*)    All other components within normal limits  CBC WITH DIFFERENTIAL/PLATELET - Abnormal; Notable for the following components:   WBC 12.7 (*)    Monocytes Absolute 1.6 (*)    Abs Immature Granulocytes 0.20 (*)    All other components within normal limits  COMPREHENSIVE METABOLIC PANEL - Abnormal; Notable for the following components:   Sodium 132 (*)    Chloride 87 (*)    CO2 36 (*)    Glucose, Bld 121 (*)    Calcium 8.6 (*)    Total Protein 6.3 (*)    Albumin 3.1 (*)    AST 9 (*)    All other components within normal limits  SALICYLATE LEVEL - Abnormal; Notable for the following components:   Salicylate Lvl <7.0 (*)    All other components within normal limits  TSH - Abnormal; Notable for the following components:   TSH 4.559 (*)    All other components within normal limits   ____________________________________________ Ordered CT HEAD  NON acute  CXR - Poor inspiratory  effort.   Mild vascular congestion without edema.    CTabd/pelvis - Colitis, bilateral PNA   _________________________ Troponin 7 ECG: Ordered Personally reviewed by me showing: HR : 77 Rhythm:  NSR,   no evidence of ischemic changes QTC 455 _____________   The recent clinical data is shown below. Vitals:   03/29/21 1645 03/29/21 1715 03/29/21 1800 03/29/21 1845  BP: 129/64 123/61 (!) 114/57 (!) 101/52  Pulse: 67 63 65 70  Resp: 18 (!) 31 20 12   Temp:      TempSrc:      SpO2: 90% 95% 96% 94%  Weight:      Height:          WBC     Component Value Date/Time   WBC 12.7 (H) 03/29/2021 1614   LYMPHSABS 3.1 03/29/2021 1614   MONOABS 1.6 (H) 03/29/2021 1614   EOSABS 0.1 03/29/2021 1614   BASOSABS 0.1 03/29/2021 1614        Lactic Acid, Venous    Component Value Date/Time   LATICACIDVEN 1.0 03/29/2021 1610     Procalcitonin   Ordered   UA evidence of UTI      Urine analysis:    Component Value Date/Time   COLORURINE YELLOW 03/29/2021 2000   APPEARANCEUR CLOUDY (A) 03/29/2021 2000   LABSPEC 1.034 (H) 03/29/2021 2000   PHURINE 6.0 03/29/2021 2000   GLUCOSEU NEGATIVE 03/29/2021 2000   HGBUR NEGATIVE 03/29/2021 2000   BILIRUBINUR NEGATIVE 03/29/2021 2000   KETONESUR NEGATIVE 03/29/2021 2000   PROTEINUR NEGATIVE 03/29/2021 2000   NITRITE POSITIVE (A) 03/29/2021 2000   LEUKOCYTESUR LARGE (A) 03/29/2021 2000    Results for orders placed or performed during the hospital encounter of 03/29/21  Resp Panel by RT-PCR (Flu A&B, Covid) Nasopharyngeal Swab     Status: None   Collection Time: 03/29/21  4:12 PM   Specimen: Nasopharyngeal Swab; Nasopharyngeal(NP) swabs in vial transport medium  Result Value Ref Range Status   SARS Coronavirus 2 by RT PCR NEGATIVE NEGATIVE Final         Influenza A by PCR NEGATIVE NEGATIVE Final   Influenza B by PCR NEGATIVE NEGATIVE Final           _______________________________________________ Hospitalist was called for  admission for  colitis and sepsis  The following  Work up has been ordered so far:  Orders Placed This Encounter  Procedures   Resp Panel by RT-PCR (Flu A&B, Covid) Nasopharyngeal Swab   Culture, blood (single)   Culture, blood (single)   DG Chest 2 View   CT HEAD WO CONTRAST ( )   CT ABDOMEN PELVIS W CONTRAST   Brain natriuretic peptide   CBC with Differential   D-dimer, quantitative   Blood gas, venous (at [facility] and [facility], not at [facility])   Comprehensive metabolic panel   Salicylate level   Ammonia   Urinalysis, Routine w reflex microscopic   TSH   T4, free   Rapid urine drug screen (hospital performed)   Valproic acid level   Cardiac monitoring   vancomycin per pharmacy consult   piperacillin-tazobactam (ZOSYN) per pharmacy consult   Consult to hospitalist   Pulse oximetry (single)   EKG 12-Lead   Insert peripheral IV   Seizure precautions     Following Medications were ordered in ER: Medications  ipratropium-albuterol (DUONEB) 0.5-2.5 (3) MG/3ML nebulizer solution 3 mL (3 mLs Nebulization Given 03/29/21 1714)  ipratropium-albuterol (DUONEB) 0.5-2.5 (3) MG/3ML nebulizer solution 3 mL (3 mLs Nebulization Given 03/29/21 1805)  iohexol (OMNIPAQUE) 350 MG/ML injection 80 mL (80 mLs Intravenous Contrast Given 03/29/21 1824)  lactated ringers bolus 1,000 mL (1,000 mLs Intravenous Bolus 03/29/21 1900)        Consult Orders  (From admission, onward)           Start     Ordered   03/29/21 1849  Consult to hospitalist  Once       Provider:  (Not yet assigned)  Question Answer Comment  Place call to: [facility]   Reason for Consult Admit      03/29/21 1849              OTHER Significant initial  Findings:  labs showing:    Recent Labs  Lab 03/29/21 1614  NA 132*  K 4.9  CO2 36*  GLUCOSE 121*  BUN 16  CREATININE 0.55  CALCIUM 8.6*    Cr  stable,   Lab Results  Component Value Date   CREATININE 0.55 03/29/2021   CREATININE 0.54 11/30/2020    CREATININE 0.60 11/29/2020    Recent Labs  Lab 03/29/21 1614  AST 9*  ALT 9  ALKPHOS 39  BILITOT 0.4  PROT 6.3*  ALBUMIN 3.1*   Lab Results  Component Value Date   CALCIUM 8.6 (L) 03/29/2021   PHOS 3.0 11/19/2020           Plt: Lab Results  Component Value Date   PLT 193 03/29/2021      COVID-19 Labs  Recent Labs    03/29/21 1614  DDIMER <0.27    Lab Results  Component Value Date   SARSCOV2NAA NEGATIVE 03/29/2021   SARSCOV2NAA NEGATIVE 12/02/2020   SARSCOV2NAA NEGATIVE 11/17/2020   SARSCOV2NAA NEGATIVE 05/13/2020        Recent Labs  Lab 03/29/21 1614  WBC 12.7*  NEUTROABS 7.7  HGB 12.2  HCT 39.1  MCV 91.8  PLT 193    HG/HCT   stable,      Component Value Date/Time   HGB 12.2 03/29/2021 1614   HCT 39.1 03/29/2021 1614   MCV 91.8 03/29/2021 1614      No results for input(s): LIPASE, AMYLASE in the last 168 hours. Recent Labs  Lab 03/29/21 1610  AMMONIA 16        BNP (last 3 results) Recent  Labs    11/29/20 0505 11/30/20 0500 03/29/21 1614  BNP 43.6 48.4 118.6*      DM  labs:  HbA1C: Recent Labs    11/18/20 0154  HGBA1C 6.1*       CBG (last 3)  No results for input(s): GLUCAP in the last 72 hours.        Cultures:    Component Value Date/Time   SDES  11/18/2020 1957    BLOOD RIGHT ANTECUBITAL Performed at [facility], [address], [place], [place] [address]    SDES  11/18/2020 1957    BLOOD RIGHT HAND Performed at [facility], [address] [place], [place] [address]    SPECREQUEST  11/18/2020 1957    BOTTLES DRAWN AEROBIC ONLY Blood Culture adequate volume Performed at [facility], [address] [place], [place] [address]    SPECREQUEST  11/18/2020 1957    BOTTLES DRAWN AEROBIC ONLY Blood Culture adequate volume Performed at [facility], [address], [place], [place] [address]    CULT  11/18/2020 1957    NO GROWTH 5  DAYS Performed at [facility], [address] [place], [place] [address]    CULT  11/18/2020 1957    NO GROWTH 5 DAYS Performed at [facility], [address] [place], [place] [address]    REPTSTATUS 11/24/2020 FINAL 11/18/2020 1957   REPTSTATUS 11/24/2020 FINAL 11/18/2020 1957     Radiological Exams on Admission: DG Chest 2 View  Result Date: 03/29/2021 CLINICAL DATA:  Fatigue and dyspnea EXAM: CHEST - 2 VIEW COMPARISON:  11/27/2020 FINDINGS: Cardiac shadow is mildly prominent but stable. Previously seen right jugular catheter is been removed in the interval. The overall inspiratory effort is poor. Mild central vascular congestion is noted without definitive edema. No sizable effusion is seen. IMPRESSION: Poor inspiratory effort. Mild vascular congestion without edema. Electronically Signed   By: Alcide Clever M.D.   On: 03/29/2021 17:28   CT HEAD WO CONTRAST ( )  Result Date: 03/29/2021 CLINICAL DATA:  Delirium, fatigue, altered mental status EXAM: CT HEAD WITHOUT CONTRAST TECHNIQUE: Contiguous axial images were obtained from the base of the skull through the vertex without intravenous contrast. COMPARISON:  11/21/2020 FINDINGS: Brain: No evidence of acute infarction, hemorrhage, hydrocephalus, extra-axial collection or mass lesion/mass effect. Periventricular white matter changes, likely the sequela of chronic small vessel ischemic disease. Vascular: No hyperdense vessel or unexpected calcification. Skull: Normal. Negative for fracture or focal lesion. Sinuses/Orbits: Air-fluid level in the right sphenoid sinus. Mild mucosal thickening in the ethmoid air cells. The orbits are unremarkable. Other: The mastoids are clear. IMPRESSION: 1. No acute intracranial process. 2. Air-fluid level in the right sphenoid sinus, which can be seen in the setting of acute sinusitis. Electronically Signed   By: Wiliam Ke M.D.   On: 03/29/2021 16:56   CT ABDOMEN PELVIS W CONTRAST  Result  Date: 03/29/2021 CLINICAL DATA:  Acute generalized abdominal pain. EXAM: CT ABDOMEN AND PELVIS WITH CONTRAST TECHNIQUE: Multidetector CT imaging of the abdomen and pelvis was performed using the standard protocol following bolus administration of intravenous contrast. CONTRAST:  80mL OMNIPAQUE IOHEXOL 350 MG/ML SOLN COMPARISON:  November 17, 2020. FINDINGS: Lower chest: Mild bilateral posterior basilar subsegmental atelectasis or infiltrates are noted with small bilateral pleural effusions. Hepatobiliary: Cholelithiasis is noted. No biliary dilatation is noted. The liver is unremarkable. Pancreas: Unremarkable. No pancreatic ductal dilatation or surrounding inflammatory changes. Spleen: Normal in size  without focal abnormality. Adrenals/Urinary Tract: Adrenal glands appear normal. Small nonobstructive left renal calculus is noted. Right renal cyst is noted. No hydronephrosis or renal obstruction is noted. Urinary bladder is unremarkable. Stomach/Bowel: Stomach appears normal. There is no definite evidence of bowel obstruction. The appendix is not clearly visualized. Large amount of stool seen in the distal sigmoid colon and rectum with wall thickening suggesting some degree of proctocolitis. Vascular/Lymphatic: Aortic atherosclerosis. No enlarged abdominal or pelvic lymph nodes. Reproductive: Stable uterine fibroids. No adnexal abnormality is noted. Other: Small fat containing left inguinal hernia is noted. No ascites is noted. Musculoskeletal: No acute or significant osseous findings. IMPRESSION: Mild bilateral posterior basilar subsegmental atelectasis or infiltrates are noted with small bilateral pleural effusions. Cholelithiasis. Large amount of stool seen in distal sigmoid colon and rectum with wall thickening suggesting some degree of proctocolitis. Small nonobstructive left renal calculus. No hydronephrosis or renal obstruction is noted. Stable uterine fibroids. Small fat containing left inguinal hernia. Aortic  Atherosclerosis (ICD10-I70.0). Electronically Signed   By: Lupita Raider M.D.   On: 03/29/2021 18:40   _______________________________________________________________________________________________________ Latest  Blood pressure (!) 101/52, pulse 70, temperature 98.4 F (36.9 C), temperature source Oral, resp. rate 12, height  (1.676 m), weight 90.7 kg, SpO2 94 %.   Review of Systems:    Pertinent positives include:  confusion  Constitutional:  No weight loss, night sweats, Fevers, chills, fatigue, weight loss  HEENT:  No headaches, Difficulty swallowing,Tooth/dental problems,Sore throat,  No sneezing, itching, ear ache, nasal congestion, post nasal drip,  Cardio-vascular:  No chest pain, Orthopnea, PND, anasarca, dizziness, palpitations.no Bilateral lower extremity swelling  GI:  No heartburn, indigestion, abdominal pain, nausea, vomiting, diarrhea, change in bowel habits, loss of appetite, melena, blood in stool, hematemesis Resp:  no shortness of breath at rest. No dyspnea on exertion, No excess mucus, no productive cough, No non-productive cough, No coughing up of blood.No change in color of mucus.No wheezing. Skin:  no rash or lesions. No jaundice GU:  no dysuria, change in color of urine, no urgency or frequency. No straining to urinate.  No flank pain.  Musculoskeletal:  No joint pain or no joint swelling. No decreased range of motion. No back pain.  Psych:  No change in mood or affect. No depression or anxiety. No memory loss.  Neuro: no localizing neurological complaints, no tingling, no weakness, no double vision, no gait abnormality, no slurred speech, no   All systems reviewed and apart from HOPI all are negative _______________________________________________________________________________________________ Past Medical History:   Past Medical History:  Diagnosis Date   Atrial fibrillation (HCC)    Bipolar disorder (HCC)    Bipolar disorder (HCC) 04/27/2020    Clostridium difficile diarrhea 08/02/2019   Dehydration    Essential hypertension    Fall 02/11/2020   Morbid obesity (HCC)    Osteomyelitis (HCC) 12/03/2019   Pneumonia due to COVID-19 virus 09/15/2019   Type 2 diabetes mellitus (HCC)    Weakness       Past Surgical History:  Procedure Laterality Date   INCISION AND DRAINAGE PERIRECTAL ABSCESS Left 12/04/2019   Procedure: IRRIGATION AND DEBRIDEMENT LEFT BUTTOCK ABSCESS;  Surgeon: Violeta Gelinas, MD;  Location: [facility];  Service: General;  Laterality: Left;   INCISION AND DRAINAGE PERIRECTAL ABSCESS Left 12/10/2019   Procedure: REPEAT IRRIGATION AND DEBRIDEMENT BUTTOCK  ABSCESS;  Surgeon: Abigail Miyamoto, MD;  Location: [facility] OR;  Service: General;  Laterality: Left;   IR FLUORO GUIDE CV LINE RIGHT  11/25/2020  IR RADIOLOGIST EVAL & MGMT  12/29/2020   IR US GUIDE VASC ACCESS RIGHT  11/25/2020    Social History:  Ambulatory  bed bound     reports that she has never smoked. She has never used smokeless tobacco. She reports that she does not currently use alcohol. She reports that she does not currently use drugs.    Family History:   Family History  Family history unknown: Yes   ______________________________________________________________________________________________ Allergies: Allergies  Allergen Reactions   Chlorhexidine Gluconate Itching   Vancomycin     Vancomycin infusion related reaction- May need Vanc to be given at a slower rate    Acetazolamide Er Rash     Prior to Admission medications   Medication Sig Start Date End Date Taking? Authorizing Provider  Amino Acids-Protein Hydrolys (FEEDING SUPPLEMENT, PRO-STAT SUGAR FREE 64,) LIQD Take 30 mLs by mouth 2 (two) times daily. 01/05/20   Regalado, Belkys A, MD  amLODipine (NORVASC) 10 MG tablet Take 1 tablet (10 mg total) by mouth daily. 12/03/20   Kathlen ModyAkula, Vijaya, MD  apixaban (ELIQUIS) 5 MG TABS tablet Take 5 mg by mouth 2 (two) times daily.    [provider]   atorvastatin (LIPITOR) 10 MG tablet Take 10 mg by mouth daily.    [provider]  calcium carbonate (TUMS - DOSED IN MG ELEMENTAL CALCIUM) 500 MG chewable tablet Chew 500 mg by mouth daily.    [provider]  carvedilol (COREG) 6.25 MG tablet Take 1 tablet (6.25 mg total) by mouth 2 (two) times daily with a meal. 12/02/20   Kathlen ModyAkula, Vijaya, MD  Cholecalciferol (VITAMIN D) 125 MCG (5000 UT) CAPS Take 10,000 Units by mouth once a week. Wednesday    [provider]  diphenhydrAMINE (BENADRYL) 2 % cream Apply 1 application topically 3 (three) times daily as needed for itching.    [provider]  divalproex (DEPAKOTE) 500 MG DR tablet Take 500 mg by mouth 2 (two) times daily.    [provider]  docusate sodium (COLACE) 100 MG capsule Take 1 capsule (100 mg total) by mouth 2 (two) times daily as needed for mild constipation. 01/05/20   Regalado, Belkys A, MD  insulin aspart (NOVOLOG) 100 UNIT/ML injection CBG 70 - 120: 0 units CBG 121 - 150: 2 units CBG 151 - 200: 3 units CBG 201 - 250: 5 units CBG 251 - 300: 8 units CBG 301 - 350: 11 units CBG 351 - 400: 15 units 12/02/20   Kathlen ModyAkula, Vijaya, MD  Ipratropium-Albuterol (COMBIVENT RESPIMAT) 20-100 MCG/ACT AERS respimat Inhale 1 puff into the lungs every 12 (twelve) hours.    [provider]  Multiple Vitamin (MULTIVITAMIN WITH MINERALS) TABS tablet Take 1 tablet by mouth daily.    [provider]  pantoprazole (PROTONIX) 40 MG tablet Take 1 tablet (40 mg total) by mouth 2 (two) times daily. Patient taking differently: Take 40 mg by mouth daily. 01/05/20   Regalado, Belkys A, MD  polyethylene glycol (MIRALAX / GLYCOLAX) 17 g packet Take 17 g by mouth 2 (two) times daily. 12/02/20   Kathlen ModyAkula, Vijaya, MD  risperiDONE (RISPERDAL M-TABS) 2 MG disintegrating tablet Take 1 tablet (2 mg total) by mouth 2 (two) times daily. 12/02/20   Kathlen ModyAkula, Vijaya, MD  sennosides-docusate sodium (SENOKOT-S) 8.6-50 MG tablet  Take 1 tablet by mouth daily.    [provider]  tamsulosin (FLOMAX) 0.4 MG CAPS capsule Take 1 capsule (0.4 mg total) by mouth daily. 12/03/20   Blake DivineAkula,  Edson Snowball, MD    ___________________________________________________________________________________________________ Physical Exam: Vitals with BMI 03/29/2021 03/29/2021 03/29/2021  Height - - -  Weight - - -  BMI - - -  Systolic 101 114 161  Diastolic 52 57 61  Pulse 70 65 63     1. General:  in No   Acute distress   acutely ill -appearing 2. Psychological: somnolent not arousable  3. Head/ENT:    Dry Mucous Membranes                          Head Non traumatic, neck supple                           Poor Dentition 4. SKIN: normal  Skin turgor,  Skin clean Dry and intact no rash 5. Heart: Regular rate and rhythm no  Murmur, no Rub or gallop 6. Lungs:  no wheezes or crackles   7. Abdomen: Soft,  non-tender, Non distended   obese   8. Lower extremities: no clubbing, cyanosis, no  edema 9. Neurologically unresponsive does not follow commands 10. MSK: Normal range of motion    Chart has been reviewed  ______________________________________________________________________________________________  Assessment/Plan  75 y.o. 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  She has a MOST form in the room that states that patient is full code we will follow scope of  practice  noted to have hypoxia and hypercarbia on my evaluation.  ABG showed PCO2 of 123 patient was intubated in the emergency department and care was transferred to [facility] team.   Severe sepsis [facility]) patient was started on IV antibiotics which was continued by me prior to patient being intubated.    CAP (community acquired pneumonia) -IV antibiotics  Colitis -patient was initially started on Zosyn we will continue care as per medical medicine  Critical care medicine to assume care of the patient.  As she is currently intubated secondary to severe hypoxic hypercarbic respiratory failure  Other plan as per orders.  DVT prophylaxis:  Elqiuis   Becky Hendricks 03/30/2021, 1:26 AM    [facility]     after 2 AM please page floor coverage PA If 7AM-7PM, please contact the day team taking care of the patient using [email or web address]   Patient was evaluated in the context of the global COVID-19 pandemic, which necessitated consideration that the patient might be at risk for infection with the SARS-CoV-2 virus that causes COVID-19. Institutional protocols and algorithms that pertain to the evaluation of patients at risk for COVID-19 are in a state of rapid change based on information released by regulatory bodies including the CDC and federal and state organizations. These policies and algorithms were followed during the patient's care.

## 2021-03-29 NOTE — Progress Notes (Signed)
Pharmacy Antibiotic Note  Becky Hendricks is a 75 y.o. female admitted on 03/29/2021 with pneumonia and intra-abdominal infection .  Pharmacy has been consulted for Vancomycin & Zosyn dosing.  Plan: Zosyn 3.375g IV q8h (4 hour infusion). Vancomycin 1500mg  IV q24h to target AUC 400-550 Check Vancomycin levels at steady state Monitor renal function and cx data  Daily Scr while on Vanc/Zosyn combination  Height: 5\' 6"  (167.6 cm) Weight: 90.7 kg (200 lb) IBW/kg (Calculated) : 59.3  Temp (24hrs), Avg:98.4 F (36.9 C), Min:98.4 F (36.9 C), Max:98.4 F (36.9 C)  Recent Labs  Lab 03/29/21 1610 03/29/21 1614  WBC  --  12.7*  CREATININE  --  0.55  LATICACIDVEN 1.0  --     Estimated Creatinine Clearance: 71.1 mL/min (by C-G formula based on SCr of 0.55 mg/dL).    Allergies  Allergen Reactions   Chlorhexidine Gluconate Itching   Vancomycin     Vancomycin infusion related reaction- May need Vanc to be given at a slower rate    Acetazolamide Er Rash    Antimicrobials this admission: 8/17 Zosyn >>  8/17 Vancomycin >>   Dose adjustments this admission:  Microbiology results: 8/17 BCx:   MRSA PCR:  8/17 Resp PCR: negative for COVID & influenza A/B  Thank you for allowing pharmacy to be a part of this patient's care.  9/17 PharmD 03/29/2021 8:32 PM

## 2021-03-30 ENCOUNTER — Other Ambulatory Visit: Payer: Self-pay

## 2021-03-30 ENCOUNTER — Inpatient Hospital Stay (HOSPITAL_COMMUNITY): Payer: Medicare Other

## 2021-03-30 DIAGNOSIS — M6281 Muscle weakness (generalized): Secondary | ICD-10-CM

## 2021-03-30 DIAGNOSIS — Z7189 Other specified counseling: Secondary | ICD-10-CM

## 2021-03-30 DIAGNOSIS — Z515 Encounter for palliative care: Secondary | ICD-10-CM

## 2021-03-30 LAB — GLUCOSE, CAPILLARY
Glucose-Capillary: 141 mg/dL — ABNORMAL HIGH (ref 70–99)
Glucose-Capillary: 111 mg/dL — ABNORMAL HIGH (ref 70–99)
Glucose-Capillary: 77 mg/dL (ref 70–99)
Glucose-Capillary: 80 mg/dL (ref 70–99)
Glucose-Capillary: 103 mg/dL — ABNORMAL HIGH (ref 70–99)
Glucose-Capillary: 123 mg/dL — ABNORMAL HIGH (ref 70–99)
Glucose-Capillary: 136 mg/dL — ABNORMAL HIGH (ref 70–99)

## 2021-03-30 LAB — CBC WITH DIFFERENTIAL/PLATELET
Abs Immature Granulocytes: 0.1 10*3/uL — ABNORMAL HIGH (ref 0.00–0.07)
Basophils Absolute: 0 10*3/uL (ref 0.0–0.1)
Basophils Relative: 0 %
Eosinophils Absolute: 0 10*3/uL (ref 0.0–0.5)
Eosinophils Relative: 0 %
HCT: 34.6 % — ABNORMAL LOW (ref 36.0–46.0)
Hemoglobin: 10.8 g/dL — ABNORMAL LOW (ref 12.0–15.0)
Immature Granulocytes: 1 %
Lymphocytes Relative: 15 %
Lymphs Abs: 1.2 10*3/uL (ref 0.7–4.0)
MCH: 28.5 pg (ref 26.0–34.0)
MCHC: 31.2 g/dL (ref 30.0–36.0)
MCV: 91.3 fL (ref 80.0–100.0)
Monocytes Absolute: 0.6 10*3/uL (ref 0.1–1.0)
Monocytes Relative: 8 %
Neutro Abs: 6.2 10*3/uL (ref 1.7–7.7)
Neutrophils Relative %: 76 %
Platelets: 169 10*3/uL (ref 150–400)
RBC: 3.79 MIL/uL — ABNORMAL LOW (ref 3.87–5.11)
RDW: 15.1 % (ref 11.5–15.5)
WBC: 8.3 10*3/uL (ref 4.0–10.5)
nRBC: 0.2 % (ref 0.0–0.2)

## 2021-03-30 LAB — TRIGLYCERIDES: Triglycerides: 107 mg/dL (ref <150)

## 2021-03-30 LAB — COMPREHENSIVE METABOLIC PANEL
ALT: 9 U/L (ref 0–44)
AST: 12 U/L — ABNORMAL LOW (ref 15–41)
Albumin: 2.6 g/dL — ABNORMAL LOW (ref 3.5–5.0)
Alkaline Phosphatase: 35 U/L — ABNORMAL LOW (ref 38–126)
Anion gap: 10 (ref 5–15)
BUN: 22 mg/dL (ref 8–23)
CO2: 30 mmol/L (ref 22–32)
Calcium: 8.3 mg/dL — ABNORMAL LOW (ref 8.9–10.3)
Chloride: 91 mmol/L — ABNORMAL LOW (ref 98–111)
Creatinine, Ser: 0.53 mg/dL (ref 0.44–1.00)
GFR, Estimated: >60 mL/min (ref >60)
Glucose, Bld: 129 mg/dL — ABNORMAL HIGH (ref 70–99)
Potassium: 4.9 mmol/L (ref 3.5–5.1)
Sodium: 131 mmol/L — ABNORMAL LOW (ref 135–145)
Total Bilirubin: 0.4 mg/dL (ref 0.3–1.2)
Total Protein: 5.4 g/dL — ABNORMAL LOW (ref 6.5–8.1)

## 2021-03-30 LAB — BLOOD GAS, ARTERIAL
Acid-Base Excess: 7.7 mmol/L — ABNORMAL HIGH (ref 0.0–2.0)
Bicarbonate: 31.4 mmol/L — ABNORMAL HIGH (ref 20.0–28.0)
FIO2: 70
MECHVT: 470 mL
O2 Saturation: 98.3 %
PEEP: 5 cmH2O
Patient temperature: 98.6
RATE: 18 resp/min
pCO2 arterial: 41.1 mmHg (ref 32.0–48.0)
pH, Arterial: 7.496 — ABNORMAL HIGH (ref 7.350–7.450)
pO2, Arterial: 102 mmHg (ref 83.0–108.0)

## 2021-03-30 LAB — HEMOGLOBIN A1C
Hgb A1c MFr Bld: 7 % — ABNORMAL HIGH (ref 4.8–5.6)
Mean Plasma Glucose: 154.2 mg/dL

## 2021-03-30 LAB — TSH: TSH: 3.533 u[IU]/mL (ref 0.350–4.500)

## 2021-03-30 LAB — MRSA NEXT GEN BY PCR, NASAL: MRSA by PCR Next Gen: DETECTED — AB

## 2021-03-30 LAB — PROCALCITONIN: Procalcitonin: <0.1 ng/mL

## 2021-03-30 LAB — STREP PNEUMONIAE URINARY ANTIGEN: Strep Pneumo Urinary Antigen: NEGATIVE

## 2021-03-30 LAB — MAGNESIUM: Magnesium: 1.8 mg/dL (ref 1.7–2.4)

## 2021-03-30 LAB — PHOSPHORUS: Phosphorus: 4.3 mg/dL (ref 2.5–4.6)

## 2021-03-30 LAB — OSMOLALITY, URINE: Osmolality, Ur: 559 mOsm/kg (ref 300–900)

## 2021-03-30 IMAGING — DX DG CHEST 1V PORT
1 series · 1 of 1 positions shown · non-contrast
Comparison: Film from earlier in the same day.

CLINICAL DATA: Status post re-intubation

EXAM:
PORTABLE CHEST 1 VIEW

[chest ap]
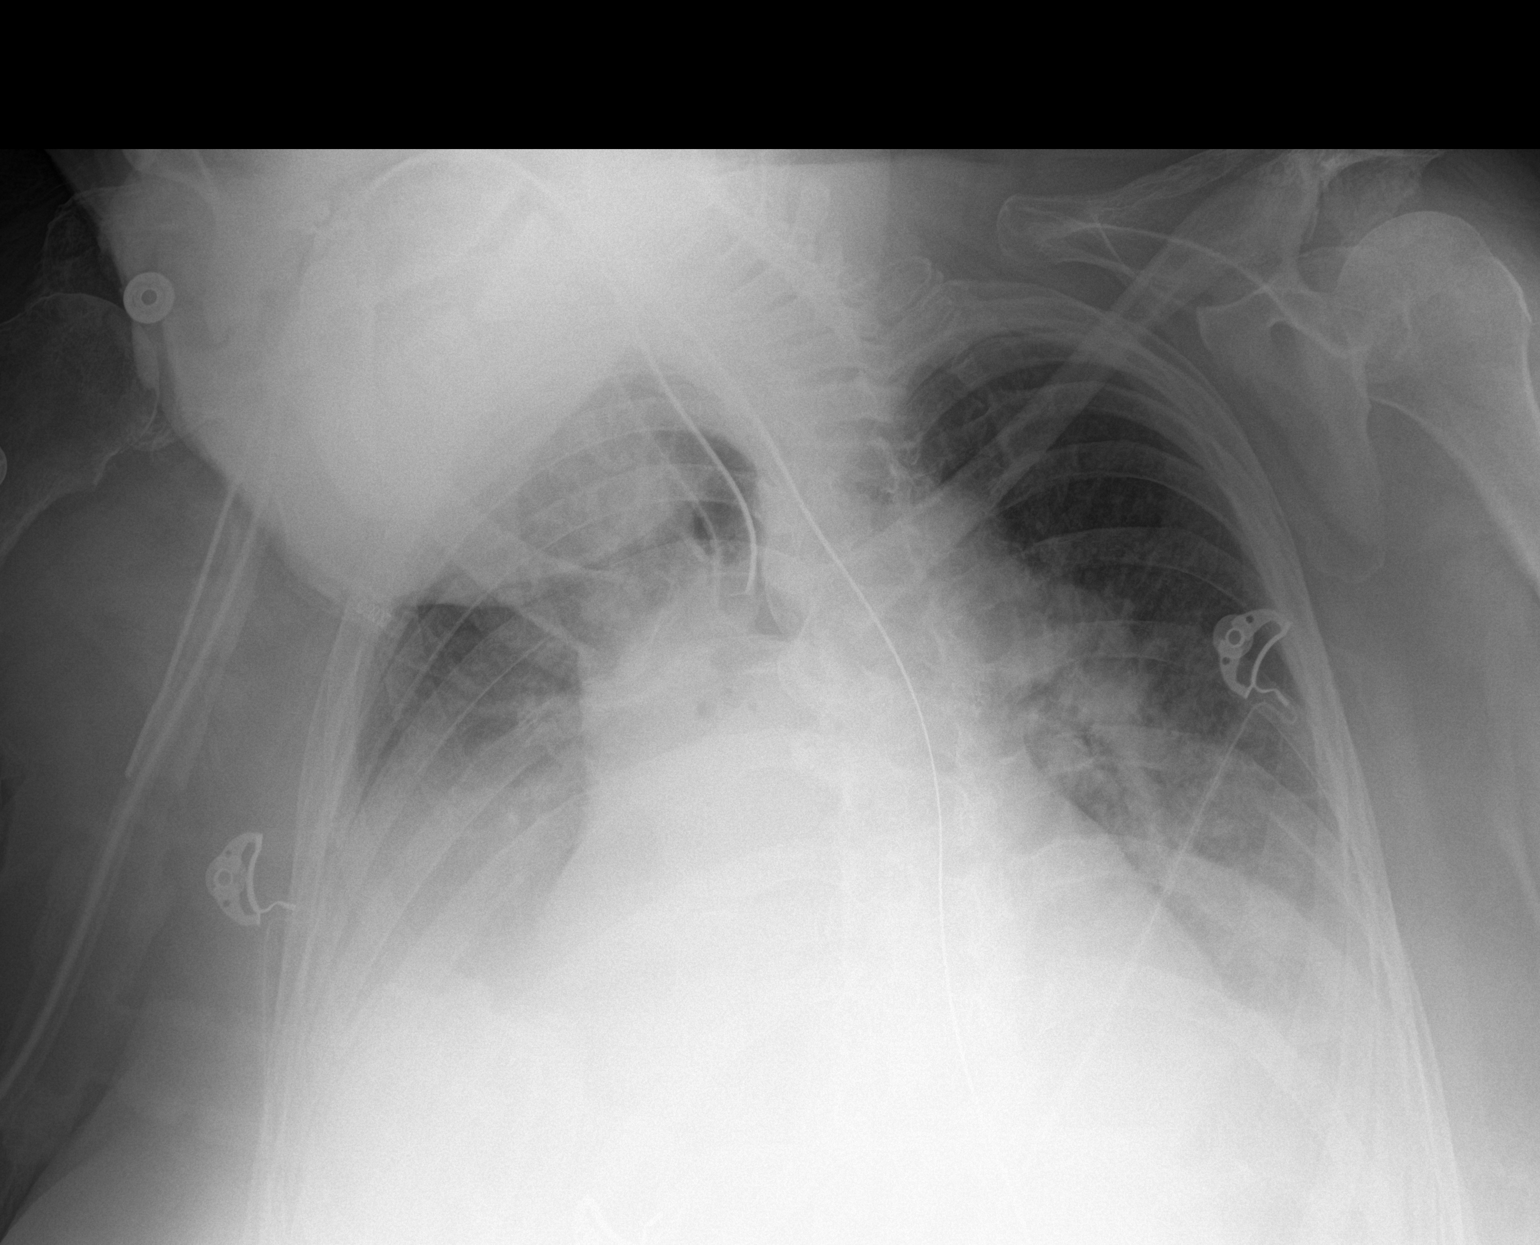

[1 of 1 positions shown; findings below may reference images not displayed]

FINDINGS: Endotracheal tube is noted 2 cm above the carina. Gastric catheter
extends into the stomach. Cardiac shadow is enlarged but stable.
Bilateral pleural effusions are again noted. Parenchymal opacities
are seen as well.
IMPRESSION: Endotracheal tube in satisfactory position following re-intubation.
Remainder of the study is stable.

## 2021-03-30 IMAGING — DX DG CHEST 1V PORT
1 series · 1 of 1 positions shown · non-contrast
Comparison: [DATE]

CLINICAL DATA: Acute respiratory failure

EXAM:
PORTABLE CHEST 1 VIEW

[chest ap]
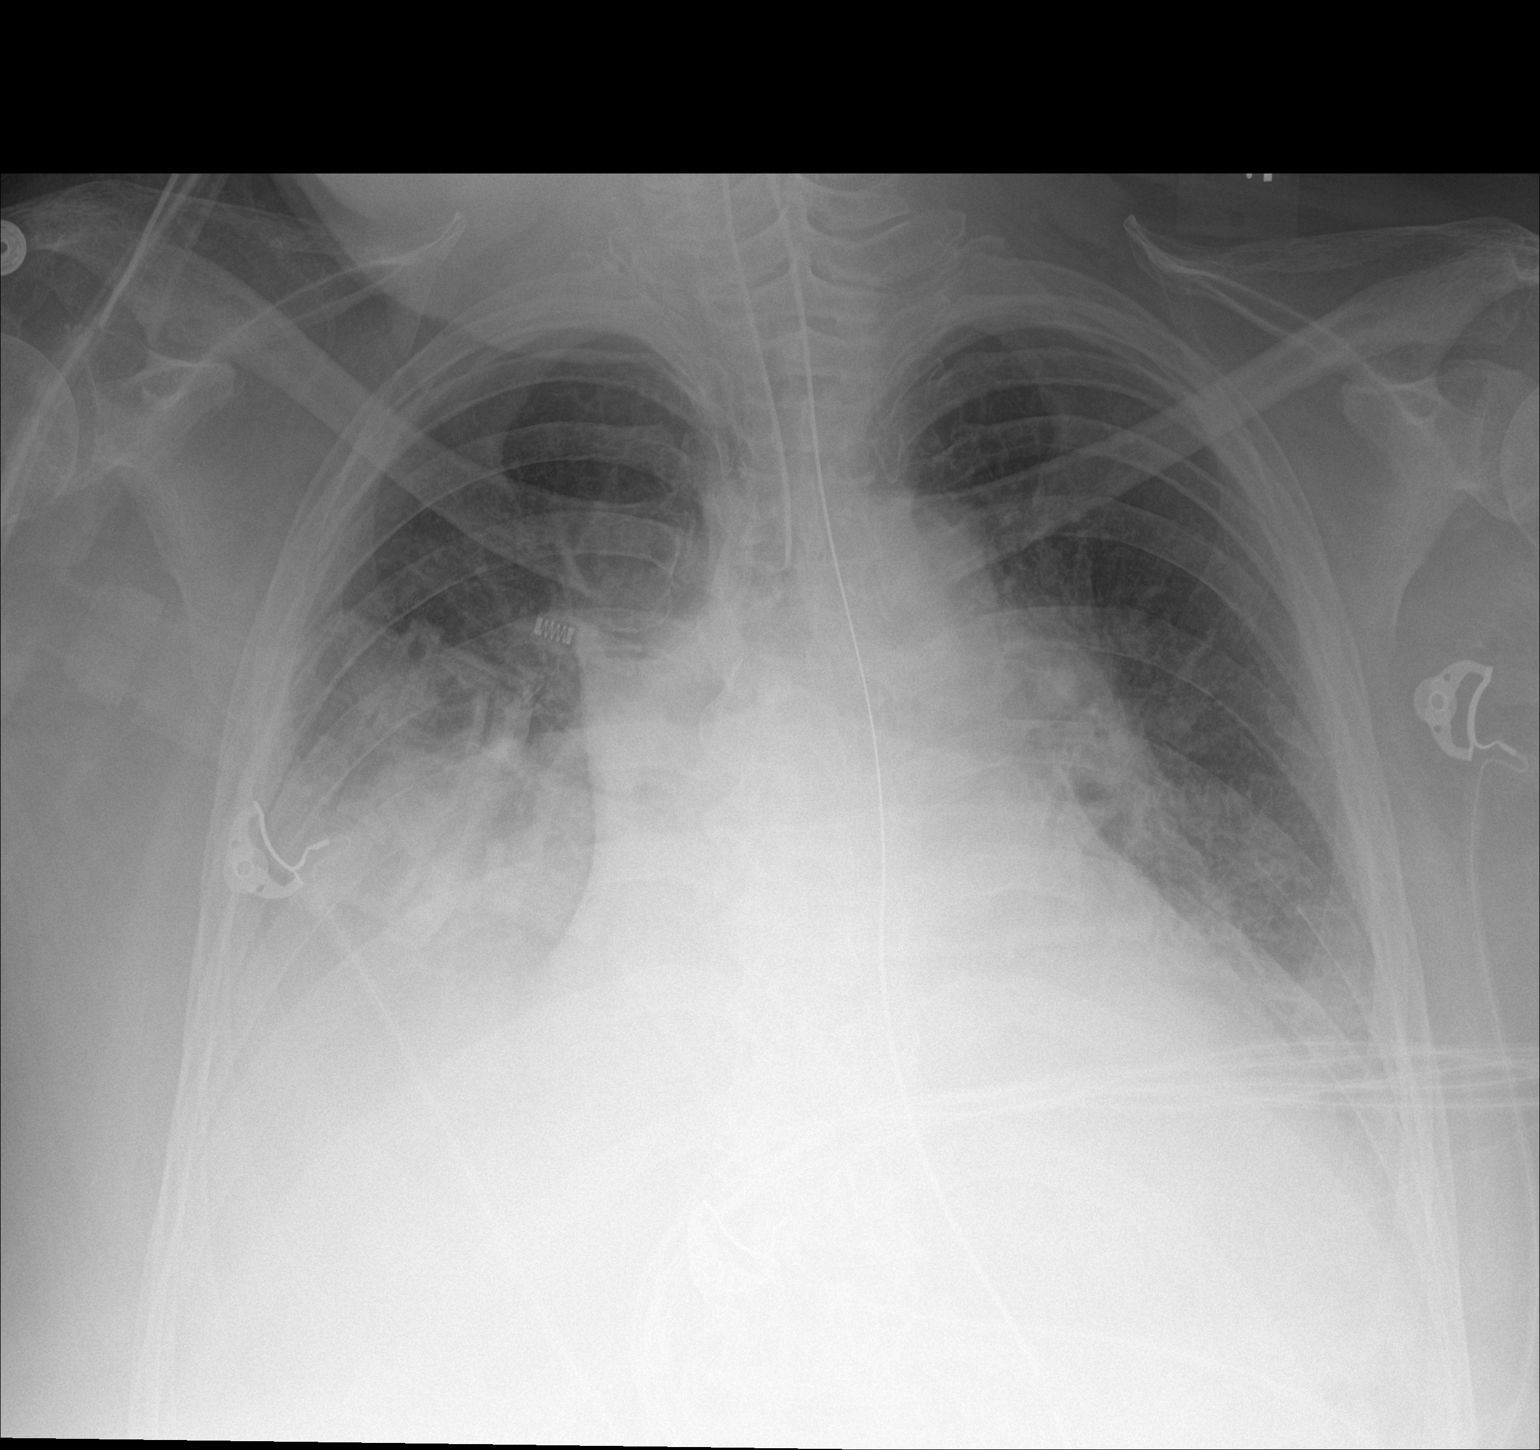

[1 of 1 positions shown; findings below may reference images not displayed]

FINDINGS: ET tube terminates 2.5 cm above the carina. Enteric tube courses
below the diaphragm with distal tip beyond the inferior margin of
the film. Stable cardiomediastinal contours. Low lung volumes.
Persistent bibasilar opacities with probable small bilateral pleural
effusions. No pneumothorax.
IMPRESSION: Persistent bibasilar opacities with probable small bilateral pleural
effusions.

## 2021-03-30 MED ORDER — ACETAMINOPHEN 325 MG PO TABS: 650.0000 mg | ORAL_TABLET | Freq: Four times a day (QID) | ORAL | Status: DC | PRN

## 2021-03-30 MED ORDER — FREE WATER
100.0000 mL | Status: DC
  Administered 2021-03-30 – 2021-03-31 (×5): 100 mL

## 2021-03-30 MED ORDER — ATORVASTATIN CALCIUM 10 MG PO TABS
10.0000 mg | ORAL_TABLET | Freq: Every day | ORAL | Status: DC
  Administered 2021-03-30 – 2021-04-07 (×7): 10 mg via ORAL
  Filled 2021-03-30 (×9): qty 1

## 2021-03-30 MED ORDER — DOCUSATE SODIUM 50 MG/5ML PO LIQD
100.0000 mg | Freq: Two times a day (BID) | ORAL | Status: DC
Start: 2021-03-30 — End: 2021-04-01
  Administered 2021-03-30 – 2021-03-31 (×4): 100 mg
  Filled 2021-03-30 (×6): qty 10

## 2021-03-30 MED ORDER — FENTANYL CITRATE (PF) 100 MCG/2ML IJ SOLN
25.0000 ug | INTRAMUSCULAR | Status: DC | PRN
  Administered 2021-03-30: 50 ug via INTRAVENOUS
  Filled 2021-03-30: qty 2

## 2021-03-30 MED ORDER — DEXMEDETOMIDINE HCL IN NACL 200 MCG/50ML IV SOLN
0.4000 ug/kg/h | INTRAVENOUS | Status: DC
  Administered 2021-03-30 – 2021-03-31 (×3): 0.4 ug/kg/h via INTRAVENOUS
  Filled 2021-03-30 (×3): qty 50

## 2021-03-30 MED ORDER — LACTATED RINGERS IV BOLUS
500.0000 mL | Freq: Once | INTRAVENOUS | Status: AC
  Administered 2021-03-30: 500 mL via INTRAVENOUS

## 2021-03-30 MED ORDER — SODIUM CHLORIDE 0.9 % IV SOLN
250.0000 mL | INTRAVENOUS | Status: DC
Start: 2021-03-30 — End: 2021-04-07
  Administered 2021-03-30: 250 mL via INTRAVENOUS

## 2021-03-30 MED ORDER — DIVALPROEX SODIUM 500 MG PO DR TAB: 500.0000 mg | DELAYED_RELEASE_TABLET | Freq: Two times a day (BID) | ORAL | Status: DC

## 2021-03-30 MED ORDER — VALPROIC ACID 250 MG/5ML PO SOLN
500.0000 mg | Freq: Two times a day (BID) | ORAL | Status: DC
  Administered 2021-03-30 (×2): 500 mg
  Filled 2021-03-30 (×3): qty 10

## 2021-03-30 MED ORDER — SODIUM CHLORIDE 0.9 % IV SOLN
2.0000 g | INTRAVENOUS | Status: DC
  Administered 2021-03-30 – 2021-04-01 (×3): 2 g via INTRAVENOUS
  Filled 2021-03-30: qty 2
  Filled 2021-03-30 (×4): qty 20

## 2021-03-30 MED ORDER — ORAL CARE MOUTH RINSE
15.0000 mL | OROMUCOSAL | Status: DC
  Administered 2021-03-30 – 2021-03-31 (×18): 15 mL via OROMUCOSAL

## 2021-03-30 MED ORDER — SODIUM CHLORIDE 0.9 % IV SOLN
INTRAVENOUS | Status: DC | PRN
  Administered 2021-03-30 – 2021-04-02 (×2): 250 mL via INTRAVENOUS

## 2021-03-30 MED ORDER — VITAL HIGH PROTEIN PO LIQD
1000.0000 mL | ORAL | Status: DC
  Administered 2021-03-30: 1000 mL

## 2021-03-30 MED ORDER — INSULIN ASPART 100 UNIT/ML IJ SOLN
0.0000 [IU] | INTRAMUSCULAR | Status: DC
  Administered 2021-03-30 (×2): 1 [IU] via SUBCUTANEOUS
  Administered 2021-03-31: 2 [IU] via SUBCUTANEOUS
  Administered 2021-03-31: 1 [IU] via SUBCUTANEOUS
  Administered 2021-03-31 – 2021-04-01 (×2): 2 [IU] via SUBCUTANEOUS

## 2021-03-30 MED ORDER — MIDAZOLAM HCL 2 MG/2ML IJ SOLN
2.0000 mg | INTRAMUSCULAR | Status: DC | PRN
  Administered 2021-03-30 (×2): 2 mg via INTRAVENOUS
  Filled 2021-03-30 (×2): qty 2

## 2021-03-30 MED ORDER — IPRATROPIUM-ALBUTEROL 0.5-2.5 (3) MG/3ML IN SOLN
3.0000 mL | Freq: Four times a day (QID) | RESPIRATORY_TRACT | Status: DC
  Administered 2021-03-30 – 2021-03-31 (×6): 3 mL via RESPIRATORY_TRACT
  Filled 2021-03-30 (×6): qty 3

## 2021-03-30 MED ORDER — PHENYLEPHRINE HCL-NACL 20-0.9 MG/250ML-% IV SOLN
25.0000 ug/min | INTRAVENOUS | Status: DC
  Administered 2021-03-30: 25 ug/min via INTRAVENOUS
  Administered 2021-03-31: 110 ug/min via INTRAVENOUS
  Administered 2021-03-31: 50 ug/min via INTRAVENOUS
  Administered 2021-03-31: 120 ug/min via INTRAVENOUS
  Administered 2021-03-31: 160 ug/min via INTRAVENOUS
  Filled 2021-03-30 (×5): qty 250

## 2021-03-30 MED ORDER — APIXABAN 5 MG PO TABS
5.0000 mg | ORAL_TABLET | Freq: Two times a day (BID) | ORAL | Status: DC
  Administered 2021-03-30 (×3): 5 mg
  Filled 2021-03-30 (×4): qty 1

## 2021-03-30 MED ORDER — ORAL CARE MOUTH RINSE
15.0000 mL | OROMUCOSAL | Status: DC
  Administered 2021-03-30 (×3): 15 mL via OROMUCOSAL

## 2021-03-30 MED ORDER — SODIUM CHLORIDE 0.9 % IV SOLN
75.0000 mL/h | INTRAVENOUS | Status: AC
  Administered 2021-03-30: 75 mL/h via INTRAVENOUS

## 2021-03-30 MED ORDER — ACETAMINOPHEN 650 MG RE SUPP: 650.0000 mg | Freq: Four times a day (QID) | RECTAL | Status: DC | PRN

## 2021-03-30 MED ORDER — SODIUM CHLORIDE 0.9 % IV BOLUS
1000.0000 mL | Freq: Once | INTRAVENOUS | Status: AC
  Administered 2021-03-30: 1000 mL via INTRAVENOUS

## 2021-03-30 MED ORDER — POLYETHYLENE GLYCOL 3350 17 G PO PACK
17.0000 g | PACK | Freq: Every day | ORAL | Status: DC
  Administered 2021-03-30: 17 g
  Filled 2021-03-30 (×2): qty 1

## 2021-03-30 MED ORDER — MUPIROCIN 2 % EX OINT
1.0000 "application " | TOPICAL_OINTMENT | Freq: Two times a day (BID) | CUTANEOUS | Status: AC
  Administered 2021-03-30 – 2021-04-03 (×10): 1 via NASAL
  Filled 2021-03-30: qty 22

## 2021-03-30 MED ORDER — HYDROCODONE-ACETAMINOPHEN 5-325 MG PO TABS
1.0000 | ORAL_TABLET | ORAL | Status: DC | PRN
Start: 2021-03-30 — End: 2021-03-31

## 2021-03-30 MED ORDER — FENTANYL BOLUS VIA INFUSION
25.0000 ug | INTRAVENOUS | Status: DC | PRN
  Administered 2021-03-30 (×4): 50 ug via INTRAVENOUS
  Filled 2021-03-30: qty 100

## 2021-03-30 MED ORDER — SODIUM CHLORIDE 0.9 % IV SOLN
500.0000 mg | INTRAVENOUS | Status: DC
  Administered 2021-03-30: 500 mg via INTRAVENOUS
  Filled 2021-03-30 (×2): qty 500

## 2021-03-30 MED ORDER — PANTOPRAZOLE SODIUM 40 MG PO TBEC: 40.0000 mg | DELAYED_RELEASE_TABLET | Freq: Every day | ORAL | Status: DC

## 2021-03-30 MED ORDER — PROSOURCE TF PO LIQD
45.0000 mL | Freq: Three times a day (TID) | ORAL | Status: DC
  Administered 2021-03-30 (×2): 45 mL
  Filled 2021-03-30 (×3): qty 45

## 2021-03-30 MED ORDER — FENTANYL CITRATE (PF) 100 MCG/2ML IJ SOLN
25.0000 ug | INTRAMUSCULAR | Status: AC | PRN
  Administered 2021-03-30 (×3): 25 ug via INTRAVENOUS
  Filled 2021-03-30 (×3): qty 2

## 2021-03-30 NOTE — H&P (Signed)
NAME:  Becky Hendricks, MRN:  [id number], DOB:  October 03, 1945, LOS: 1 ADMISSION DATE:  03/29/2021, CONSULTATION DATE:  03/30/21 REFERRING MD:  ER , CHIEF COMPLAINT:  ALOC   History of Present Illness:  This is a 75y/o female that had a 2 week hx of progressive generalized weakness presented to the ER today for [facility] and resp distress.  Limited history is available.  The pt presented from [facility].  Once in the ER the pt was found to have a ph of 7.084 and required emergent intubation. The pt did have a similar presentation in April of this year and was found to have MRSA bacteriemia at that time.    Pertinent  Medical History  HTN DM Afib Obesity  HFpF Seizure disorder  Significant Hospital Events: Including procedures, antibiotic start and stop dates in addition to other pertinent events      Objective   Blood pressure 110/70, pulse (!) 53, temperature 98.4 F (36.9 C), temperature source Bladder, resp. rate 18, height 5\' 6"  (1.676 m), weight 90.7 kg, SpO2 100 %.    Vent Mode: PRVC FiO2 (%):  [70 %-100 %] 70 % Set Rate:  [18 bmp] 18 bmp Vt Set:  [470 mL] 470 mL PEEP:  [5 cmH20] 5 cmH20 Plateau Pressure:  [18 cmH20-21 cmH20] 21 cmH20  No intake or output data in the 24 hours ending 03/30/21 0212 Filed Weights   03/29/21 1602  Weight: 90.7 kg    Examination: General: No acute distress Orally intubated  HENT: Atraumatic Normocephalic, MMM Lungs: Mild bilateral rales with scattered rhonchi Cardiovascular: Regular rate 1/6 SEM No gallop or rub Abdomen: Soft non distended  Pt nodded yes to pain on palpation but not able to localize.  No rebound or rigidity Extremities: Distal pulses are intact x4.  No edema, cyanonsis, or clubbing. Neuro: Pt is RASS-1.  When awake nods approp to questions.  Follows simple commands.  Bilateral foot drop GU: Sacral wound appears to be well healed     Assessment & Plan:  Acute resp failure- hypercapnia Severe Sepsis  ALOC multi  factorial  Hx of Seizure but does not appear to be the cause at this time of the ALOC PAF Bipolar disorder  Plan: Admit to the ICU Standard vent protocol was started.  Follow up ABG post intubation shows improvement of the resp acidosis Propofol and fentanyl were started Empiric ABX with Zosyn and Vanco were started Cultures are pending  COVID negative Protonix for GI proph Monitor I/O- UDS was negative      Best Practice (right click and "Reselect all SmartList Selections" daily)   Diet/type: NPO DVT prophylaxis: LMWH GI prophylaxis: PPI Lines: N/A Foley:  Yes, and it is still needed Code Status:  full code   Labs   CBC: Recent Labs  Lab 03/29/21 1614  WBC 12.7*  NEUTROABS 7.7  HGB 12.2  HCT 39.1  MCV 91.8  PLT 193    Basic Metabolic Panel: Recent Labs  Lab 03/29/21 1614 03/29/21 2130  NA 132*  --   K 4.9  --   CL 87*  --   CO2 36*  --   GLUCOSE 121*  --   BUN 16  --   CREATININE 0.55  --   CALCIUM 8.6*  --   MG  --  2.0  PHOS  --  6.3*   GFR: Estimated Creatinine Clearance: 71.1 mL/min (by C-G formula based on SCr of 0.55 mg/dL). Recent Labs  Lab 03/29/21 1610  03/29/21 1614 03/29/21 2130  PROCALCITON  --   --  <0.10  WBC  --  12.7*  --   LATICACIDVEN 1.0  --   --     Liver Function Tests: Recent Labs  Lab 03/29/21 1614  AST 9*  ALT 9  ALKPHOS 39  BILITOT 0.4  PROT 6.3*  ALBUMIN 3.1*   No results for input(s): LIPASE, AMYLASE in the last 168 hours. Recent Labs  Lab 03/29/21 1610  AMMONIA 16    ABG    Component Value Date/Time   PHART 7.367 03/29/2021 2308   PCO2ART 51.5 (H) 03/29/2021 2308   PO2ART 129 (H) 03/29/2021 2308   HCO3 29.7 (H) 03/29/2021 2308   TCO2 38 (H) 02/01/2020 0058   O2SAT 99.4 03/29/2021 2308     Coagulation Profile: No results for input(s): INR, PROTIME in the last 168 hours.  Cardiac Enzymes: Recent Labs  Lab 03/29/21 2130  CKTOTAL 34*    HbA1C: Hgb A1c MFr Bld  Date/Time Value Ref  Range Status  11/18/2020 01:54 AM 6.1 (H) 4.8 - 5.6 % Final    Comment:    (NOTE) Pre diabetes:          5.7%-6.4%  Diabetes:              >6.4%  Glycemic control for   <7.0% adults with diabetes   12/03/2019 11:20 PM 7.8 (H) 4.8 - 5.6 % Final    Comment:    (NOTE) Pre diabetes:          5.7%-6.4% Diabetes:              >6.4% Glycemic control for   <7.0% adults with diabetes     CBG: Recent Labs  Lab 03/30/21 0159  GLUCAP 141*    Review of Systems:   Unable to complete due to the pts neuro status  Past Medical History:  She,  has a past medical history of Atrial fibrillation (HCC), Bipolar disorder (HCC), Bipolar disorder (HCC) (04/27/2020), Clostridium difficile diarrhea (08/02/2019), Dehydration, Essential hypertension, Fall (02/11/2020), Morbid obesity (HCC), Osteomyelitis (HCC) (12/03/2019), Pneumonia due to COVID-19 virus (09/15/2019), Type 2 diabetes mellitus (HCC), and Weakness.   Surgical History:   Past Surgical History:  Procedure Laterality Date   INCISION AND DRAINAGE PERIRECTAL ABSCESS Left 12/04/2019   Procedure: IRRIGATION AND DEBRIDEMENT LEFT BUTTOCK ABSCESS;  Surgeon: Violeta Gelinas, MD;  Location: [facility] OR;  Service: General;  Laterality: Left;   INCISION AND DRAINAGE PERIRECTAL ABSCESS Left 12/10/2019   Procedure: REPEAT IRRIGATION AND DEBRIDEMENT BUTTOCK  ABSCESS;  Surgeon: Abigail Miyamoto, MD;  Location: [facility] OR;  Service: General;  Laterality: Left;   IR FLUORO GUIDE CV LINE RIGHT  11/25/2020   IR RADIOLOGIST EVAL & MGMT  12/29/2020   IR US GUIDE VASC ACCESS RIGHT  11/25/2020     Social History:   reports that she has never smoked. She has never used smokeless tobacco. She reports that she does not currently use alcohol. She reports that she does not currently use drugs.   Family History:  Her Family history is unknown by patient.   Allergies Allergies  Allergen Reactions   Chlorhexidine Gluconate Itching   Vancomycin     Vancomycin infusion related  reaction- May need Vanc to be given at a slower rate    Acetazolamide Er Rash     Home Medications  Prior to Admission medications   Medication Sig Start Date End Date Taking? Authorizing Provider  amLODipine (NORVASC) 10 MG tablet Take  1 tablet (10 mg total) by mouth daily. 12/03/20  Yes Kathlen Mody, MD  apixaban (ELIQUIS) 5 MG TABS tablet Take 5 mg by mouth 2 (two) times daily.   Yes [provider]  atorvastatin (LIPITOR) 10 MG tablet Take 10 mg by mouth daily.   Yes [provider]  calcium carbonate (TUMS - DOSED IN MG ELEMENTAL CALCIUM) 500 MG chewable tablet Chew 500 mg by mouth daily.   Yes [provider]  carvedilol (COREG) 6.25 MG tablet Take 1 tablet (6.25 mg total) by mouth 2 (two) times daily with a meal. 12/02/20  Yes Kathlen Mody, MD  Cholecalciferol (VITAMIN D) 125 MCG (5000 UT) CAPS Take 10,000 Units by mouth once a week. Wednesday   Yes [provider]  diphenhydrAMINE (BENADRYL) 2 % cream Apply 1 application topically 3 (three) times daily as needed for itching.   Yes [provider]  divalproex (DEPAKOTE) 500 MG DR tablet Take 500 mg by mouth 2 (two) times daily.   Yes [provider]  docusate sodium (COLACE) 100 MG capsule Take 1 capsule (100 mg total) by mouth 2 (two) times daily as needed for mild constipation. 01/05/20  Yes Regalado, Belkys A, MD  ferrous sulfate 324 MG TBEC Take 324 mg by mouth daily.   Yes [provider]  Ipratropium-Albuterol (COMBIVENT RESPIMAT) 20-100 MCG/ACT AERS respimat Inhale 1 puff into the lungs every 12 (twelve) hours.   Yes [provider]  JANUVIA 50 MG tablet Take 50 mg by mouth daily. 03/27/21  Yes [provider]  losartan (COZAAR) 25 MG tablet Take 25 mg by mouth daily. Hold for SBP<100 for HTN 03/27/21  Yes [provider]  Multiple Vitamin (MULTIVITAMIN WITH MINERALS) TABS tablet Take 1 tablet by mouth daily.   Yes [provider]   pantoprazole (PROTONIX) 40 MG tablet Take 1 tablet (40 mg total) by mouth 2 (two) times daily. Patient taking differently: Take 40 mg by mouth daily. 01/05/20  Yes Regalado, Belkys A, MD  polyethylene glycol (MIRALAX / GLYCOLAX) 17 g packet Take 17 g by mouth 2 (two) times daily. Patient taking differently: Take 17 g by mouth daily. 12/02/20  Yes Kathlen Mody, MD  risperiDONE (RISPERDAL) 2 MG tablet Take 2 mg by mouth 2 (two) times daily. 03/01/21  Yes [provider]  sennosides-docusate sodium (SENOKOT-S) 8.6-50 MG tablet Take 2 tablets by mouth daily.   Yes [provider]  tamsulosin (FLOMAX) 0.4 MG CAPS capsule Take 1 capsule (0.4 mg total) by mouth daily. Patient taking differently: Take 0.4 mg by mouth every other day. 12/03/20  Yes Kathlen Mody, MD  insulin aspart (NOVOLOG) 100 UNIT/ML injection CBG 70 - 120: 0 units CBG 121 - 150: 2 units CBG 151 - 200: 3 units CBG 201 - 250: 5 units CBG 251 - 300: 8 units CBG 301 - 350: 11 units CBG 351 - 400: 15 units 12/02/20   Kathlen Mody, MD     Critical care time: 

## 2021-03-30 NOTE — Progress Notes (Signed)
eLink Physician-Brief Progress Note Patient Name: Becky Hendricks DOB: September 05, 1945 MRN: [id number]   Date of Service  03/30/2021  HPI/Events of Note  Patient needs order to cover Foley catheter that was placed in the ED.  eICU Interventions  Foley catheter ordered.        Thomasene Lot Hinley Brimage 03/30/2021, 3:21 AM

## 2021-03-30 NOTE — Procedures (Deleted)
Extubation Procedure Note  Patient Details:   Name: Becky Hendricks DOB: Apr 20, 1946 MRN: [id number]   Airway Documentation:    Vent end date: 03/30/21 Vent end time: 0824   Evaluation  O2 sats: stable throughout Complications: No apparent complications Patient did tolerate procedure well. Bilateral Breath Sounds: Diminished, Rhonchi   Yes  Pt was extubated to 4L [place] per CCMD order. Pt tolerated procedure well with saturations of 99%. Pt was suctioned and had a positive cuff leak prior to extubation. No stridor is heard at this time, vitals are stable. RT will continue to monitor pt status.   Fumiko Cham A Khaylee Mcevoy 03/30/2021, 8:24 AM

## 2021-03-30 NOTE — Progress Notes (Signed)
Initial Nutrition Assessment  DOCUMENTATION CODES:   Obesity unspecified  INTERVENTION:  - will order TF regimen: Vital High Protein @ 45 ml/hr with 45 ml Prosource TF TID and 100 ml free water every 4 hours.  - this regimen will provide 1200 kcal, 127 grams protein, and 1503 ml free water.    NUTRITION DIAGNOSIS:   Inadequate oral intake related to inability to eat as evidenced by NPO status.  GOAL:   Provide needs based on ASPEN/SCCM guidelines  MONITOR:   Vent status, TF tolerance, Labs, Weight trends, I & O's  REASON FOR ASSESSMENT:   Ventilator, Consult Enteral/tube feeding initiation and management  ASSESSMENT:   75 year-old female with medical history of afib, bipolar disorder, HTN, type 2 DM, and osteomyelitis. She presented to the ED due to 2-week hx of progressive generalized weakness and respiratory distress. She required emergent intubation in the ED due to pH of 7.084.  Patient discussed in rounds this AM. She remains intubated with OGT to LIS (gastric placement).   No family/visitors present. She was seen inpatient by [facility] RDs multiple times from 08/2019-01/2020 and has not been seen since that time.   Weight today is 232 lb and weight on 11/20/20 was 194 lb. Will monitor closely. She is noted to be +371 ml since admission; had 375 ml urine output yesterday.   Per notes: - acute hypercapnic respiratory failure - severe sepsis    Patient is currently intubated on ventilator support MV: 8.5 L/min Temp (24hrs), Avg:98.2 F (36.8 C), Min:93.7 F (34.3 C), Max:100.6 F (38.1 C) Propofol: none BP: 94/52 and MAP: 61   Labs reviewed; CBGs: 141, 111, 77, 80 mg/dl, Na: 062 mmol/l, Cl: 91 mmol/l, Ca: 8.3 mg/dl. Medications reviewed; 100 mg colace BID, sliding scale novolog, 40 mg IV protonix/day, 17 g miralax/day.    NUTRITION - FOCUSED PHYSICAL EXAM:  Completed; no muscle or fat depletions, moderate edema to BLE.   Diet Order:   Diet Order              Diet NPO time specified Except for: Sips with Meds  Diet effective now                   EDUCATION NEEDS:   No education needs have been identified at this time  Skin:  Skin Assessment: Reviewed RN Assessment  Last BM:  PTA/unknown  Height:   Ht Readings from Last 1 Encounters:  03/30/21 5\' 6"  (1.676 m)    Weight:   Wt Readings from Last 1 Encounters:  03/30/21 105.2 kg      Estimated Nutritional Needs:  Kcal:  04/01/21 kcal Protein:  >/= 118 grams Fluid:  >/= 1.5 L/day      [phone], MS, RD, LDN, CNSC Inpatient Clinical Dietitian RD pager # available in [facility]  After hours/weekend pager # available in [facility]

## 2021-03-30 NOTE — Progress Notes (Signed)
RN called RT about patient biting on tube. RT was about to place a bit block in when RT noticed that the pilot balloon was missing. RN called the box to let them know the situation and CCM came and exchanged out the tube. Patient tolerated well. Bite block in place with no issues.

## 2021-03-30 NOTE — Progress Notes (Signed)
Pt transported from ED to ICU 1240 on vent 100% fio2.  Pt tolerated transport well without incident.

## 2021-03-30 NOTE — Consult Note (Addendum)
WOC Nurse Consult Note: Reason for Consult: Consult requested for sacrum. Pt is familiar to [facility] team from a previous admission.  She has pink dry healed scar tissue without an open wound, located in a deep valley.   Dressing procedure/placement/frequency: Topical treatment orders provided for staff nurses to protect from further injury: Foam dressing to sacrum, change Q 3 days or PRN soiling. Please re-consult if further assistance is needed.  Thank-you,  Cammie Mcgee MSN, RN, CWOCN, [place], CNS [phone]

## 2021-03-30 NOTE — Progress Notes (Signed)
OT Cancellation Note  Patient Details Name: Becky Hendricks MRN: [id number] DOB: 1946/04/05   Cancelled Treatment:    Reason Eval/Treat Not Completed: Medical issues which prohibited therapy patient is currently on Vent. Will check back tomorrow.  Sharyn Blitz OTR/L, MS Acute Rehabilitation Department Office# [phone] Pager# [phone]   Chalmers Guest [name] 03/30/2021, 12:12 PM

## 2021-03-30 NOTE — Progress Notes (Signed)
eLink Physician-Brief Progress Note Patient Name: Becky Hendricks DOB: [birth year] MRN: [id number]   Date of Service  03/30/2021  HPI/Events of Note  Patient admitted with altered mental status, and acute hypercapnic respiratory failure, she was intubated in the ED and is currently on the ventilator.  eICU Interventions  New Patient Evaluation. Restraints ordered to prevent self-extubation.        Thomasene Lot Maleiyah Releford 03/30/2021, 1:31 AM

## 2021-03-30 NOTE — Progress Notes (Signed)
Physical Therapy Discharge Patient Details Name: Becky Hendricks MRN: [id number] DOB: October 06, 1945 Today's Date: 03/30/2021 Time:  -     Patient discharged from PT services secondary to patient on vent. - will need to re-order PT to resume therapy services.Most likely will not need skilled PT, has been in SNF since 11/2020.   GP     Rada Hay 03/30/2021, 9:37 AM  Blanchard Kelch PT Acute Rehabilitation Services Pager [phone] Office [phone]

## 2021-03-30 NOTE — TOC Initial Note (Signed)
Transition of Care [facility]) - Initial/Assessment Note    Patient Details  Name: Becky Hendricks MRN: [id number] Date of Birth: Nov 29, 1945  Transition of Care [facility]) CM/SW Contact:    Golda Acre, RN Phone Number: 03/30/2021, 7:59 AM  Clinical Narrative:                  75y/o female that had a 2 week hx of progressive generalized weakness presented to the ER today for [facility] and resp distress.  Limited history is available.  The pt presented from [facility].  Once in the ER the pt was found to have a ph of 7.084 and required emergent intubation. The pt did have a similar presentation in April of this year and was found to have MRSA bacteriemia at that time.     Pertinent  Medical History  HTN DM Afib Obesity  HFpF Seizure disorder   Significant Hospital Events: Including procedures, antibiotic start and stop dates in addition to other pertinent events     TOC PLAN OF CARE:  to return to [facility] when stable. Following for rpogression: on vent at 70%, iv sedation, iv abx x 2.  Expected Discharge Plan: Skilled Nursing Facility Barriers to Discharge: Continued Medical Work up   Patient Goals and CMS Choice Patient states their goals for this hospitalization and ongoing recovery are:: on vent      Expected Discharge Plan and Services Expected Discharge Plan: Skilled Nursing Facility   Discharge Planning Services: CM Consult   Living arrangements for the past 2 months: Skilled Nursing Facility                                      Prior Living Arrangements/Services Living arrangements for the past 2 months: Skilled Nursing Facility Lives with:: Facility Resident Patient language and need for interpreter reviewed:: Yes              Criminal Activity/Legal Involvement Pertinent to Current Situation/Hospitalization: No - Comment as needed  Activities of Daily Living Home Assistive Devices/Equipment: Other (Comment) (pt unable to verbalize what  equipment at the facility that she uses) ADL Screening (condition at time of admission) Patient's cognitive ability adequate to safely complete daily activities?: No Is the patient deaf or have difficulty hearing?: No Does the patient have difficulty seeing, even when wearing glasses/contacts?: No Does the patient have difficulty concentrating, remembering, or making decisions?: Yes Patient able to express need for assistance with ADLs?: No Does the patient have difficulty dressing or bathing?: Yes Independently performs ADLs?: No Communication: Needs assistance Is this a change from baseline?: Change from baseline, expected to last <3 days Dressing (OT): Needs assistance Is this a change from baseline?: Pre-admission baseline Grooming: Needs assistance Is this a change from baseline?: Pre-admission baseline Feeding: Needs assistance Is this a change from baseline?: Change from baseline, expected to last <3 days Bathing: Needs assistance Is this a change from baseline?: Pre-admission baseline Toileting: Needs assistance Is this a change from baseline?: Pre-admission baseline In/Out Bed: Needs assistance Is this a change from baseline?: Pre-admission baseline Walks in Home: Needs assistance Is this a change from baseline?: Pre-admission baseline Does the patient have difficulty walking or climbing stairs?: Yes Weakness of Legs: Both Weakness of Arms/Hands: Both  Permission Sought/Granted                  Emotional Assessment Appearance:: Appears stated age Attitude/Demeanor/Rapport:  Intubated (Following Commands or Not Following Commands) Affect (typically observed): Unable to Assess Orientation: : Fluctuating Orientation (Suspected and/or reported Sundowners) Alcohol / Substance Use: Not Applicable Psych Involvement: No (comment)  Admission diagnosis:  Acute respiratory failure (HCC) [J96.00] Metabolic encephalopathy [G93.41] Colitis [K52.9] Encounter for orogastric  (OG) tube placement [Z46.59] Acute respiratory failure with hypoxia (HCC) [J96.01] Acute respiratory failure with hypoxia and hypercapnia (HCC) [J96.01, J96.02] Urinary tract infection without hematuria, site unspecified [N39.0] Pneumonia of lower lobe due to infectious organism, unspecified laterality [J18.9] Patient Active Problem List   Diagnosis Date Noted   Acute respiratory failure with hypoxia (HCC) 03/29/2021   Hyponatremia 03/29/2021   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   CAP (community acquired pneumonia) 03/29/2021   Colitis 03/29/2021   Acute respiratory failure (HCC) 03/29/2021   Bipolar affective disorder, currently manic, mild (HCC) 11/19/2020   Constipation 11/18/2020   Encephalopathy 11/18/2020   MRSA bacteremia    SIRS (systemic inflammatory response syndrome) (HCC) 11/17/2020   Nausea and vomiting 11/17/2020   Mixed diabetic hyperlipidemia associated with type 2 diabetes mellitus (HCC) 11/17/2020   Type 2 diabetes mellitus with hyperglycemia, with long-term current use of insulin (HCC) 11/17/2020   Immunization due 07/26/2020   Psychosis (HCC)    Bipolar disorder (HCC) 04/27/2020   Fall 02/11/2020   Chronic diastolic CHF (congestive heart failure) (HCC) 01/22/2020   Drug rash    Acute osteomyelitis of pelvic region and thigh (HCC)    Palliative care by specialist    Goals of care, counseling/discussion    Edema of upper extremity    Hyperphosphatemia    Cardiac arrest (HCC)    Sacral wound    Hypoxia    Severe sepsis (HCC) 12/03/2019   Abscess of multiple sites of buttock 12/03/2019   AF (paroxysmal atrial fibrillation) (HCC) 12/03/2019   Acute metabolic encephalopathy 09/15/2019   UTI (urinary tract infection), bacterial 09/15/2019   Pressure injury of skin 09/10/2019   Morbid obesity with BMI of 40.0-44.9, adult (HCC) 02/27/2018   PCP:  Roderic Scarce, MD Pharmacy:   [facility] - [place], [place] - [address] [place]  [place] [address] Phone: [phone] Fax: [phone]  [facility] - [place], [place] - [address] [place] [address] Phone: [phone] Fax: [phone]     Social Determinants of Health (SDOH) Interventions    Readmission Risk Interventions No flowsheet data found.

## 2021-03-30 NOTE — Consult Note (Signed)
Consultation Note Date: 03/30/2021   Patient Name: Becky Hendricks  DOB: 03-Mar-1946  MRN: [id number]  Age / Sex: 75 y.o., female  PCP: Roderic Scarce, MD Referring Physician: Karren Burly, MD  Reason for Consultation: Establishing goals of care  HPI/Patient Profile: 75 y.o. female  admitted on 03/29/2021  75 year old female admitted from [facility] former rehab facility with 2 weeks history of progressive generalized weakness and with acute loss of consciousness and respiratory distress.  Patient admitted to critical care service at [facility] in [facility] for acute hypercapnic respiratory failure and severe sepsis.  Patient has underlying history of paroxysmal atrial fibrillation, bipolar disorder, hypertension diabetes and seizure disorder.  She has history of cardiac arrest in May 2021.  She was admitted in April 2022 for MRSA bacteremia, has history of chronic osteomyelitis of coccyx area.  Clinical Assessment and Goals of Care: Patient remains admitted to the hospital, still on ventilatory support and has endotracheal tube.  Palliative care consultation for ongoing goals of care discussions has been requested.  Chart reviewed.  Patient seen and examined.  Patient has been seen by palliative services in previous hospitalization.  A MOST form is also present in the advance care planning section of her chart and this has been reviewed.  An election has been made for full code/full scope care on the MOST form as well as during this admission thus far.  Patient is awake alert sitting up in bed.  I introduced myself and palliative care as follows: Palliative medicine is specialized medical care for people living with serious illness. It focuses on providing relief from the symptoms and stress of a serious illness. The goal is to improve quality of life for both the patient and the  family. Goals of care: Broad aims of medical therapy in relation to the patient's values and preferences. Our aim is to provide medical care aimed at enabling patients to achieve the goals that matter most to them, given the circumstances of their particular medical situation and their constraints.   Patient is reasonably awake and alert, is able to mouth words appropriately around her endotracheal tube and is also able to nod her head appropriately.  She denies any pain or discomfort.  Call placed and discussed with her brother [name].  He recalls that the patient has a had a 1 serious illness requiring hospitalization after another for essentially the last year and a half.  He requests for patient's psychiatric medications to be resumed as quickly as possible he also requests for observing her sacral area to make sure that she does not have wounds.  Chart reviewed, shared with him about her current condition and try to address his concerns to the best of my ability.  Depakote has been resumed, wound care consultation has been completed.  His brother [name] lives in [place] and states that he will visit later on.  Goals of care discussions undertaken.  HCPOA Brother [name]  SUMMARY OF RECOMMENDATIONS   Full code/full scope care Continue current mode  of care Recommend palliative services following up with the patient at her facility. Thank you for the consult.  Code Status/Advance Care Planning: Full code   Symptom Management:     Palliative Prophylaxis:  Delirium Protocol  Additional Recommendations (Limitations, Scope, Preferences): Full Scope Treatment  Psycho-social/Spiritual:  Desire for further Chaplaincy support:yes Additional Recommendations: Caregiving  Support/Resources  Prognosis:  Unable to determine  Discharge Planning: To Be Determined      Primary Diagnoses: Present on Admission:  Acute respiratory failure with hypoxia (HCC)  Severe sepsis (HCC)  Sacral  wound  Bipolar disorder (HCC)  AF (paroxysmal atrial fibrillation) (HCC)  Acute metabolic encephalopathy  Hyponatremia  Functional quadriplegia (HCC)  CAP (community acquired pneumonia)  Colitis  Acute respiratory failure (HCC)   I have reviewed the medical record, interviewed the patient and family, and examined the patient. The following aspects are pertinent.  Past Medical History:  Diagnosis Date   Atrial fibrillation (HCC)    Bipolar disorder (HCC)    Bipolar disorder (HCC) 04/27/2020   Clostridium difficile diarrhea 08/02/2019   Dehydration    Essential hypertension    Fall 02/11/2020   Morbid obesity (HCC)    Osteomyelitis (HCC) 12/03/2019   Pneumonia due to COVID-19 virus 09/15/2019   Type 2 diabetes mellitus (HCC)    Weakness    Social History   Socioeconomic History   Marital status: Single    Spouse name: Not on file   Number of children: Not on file   Years of education: Not on file   Highest education level: Not on file  Occupational History   Not on file  Tobacco Use   Smoking status: Never   Smokeless tobacco: Never  Vaping Use   Vaping Use: Unknown  Substance and Sexual Activity   Alcohol use: Not Currently   Drug use: Not Currently   Sexual activity: Not Currently    Birth control/protection: Post-menopausal  Other Topics Concern   Not on file  Social History Narrative   Not on file   Social Determinants of Health   Financial Resource Strain: Not on file  Food Insecurity: Not on file  Transportation Needs: Not on file  Physical Activity: Not on file  Stress: Not on file  Social Connections: Not on file   Family History  Family history unknown: Yes   Scheduled Meds:  apixaban  5 mg Per Tube BID   atorvastatin  10 mg Oral Daily   docusate  100 mg Per Tube BID   feeding supplement (PROSource TF)  45 mL Per Tube TID   free water  100 mL Per Tube Q4H   insulin aspart  0-9 Units Subcutaneous Q4H   ipratropium-albuterol  3 mL Nebulization  Q6H   mouth rinse  15 mL Mouth Rinse Q2H   mupirocin ointment  1 application Nasal BID   pantoprazole (PROTONIX) IV  40 mg Intravenous QHS   polyethylene glycol  17 g Per Tube Daily   valproic acid  500 mg Per Tube BID   Continuous Infusions:  sodium chloride 10 mL/hr at 03/30/21 1400   cefTRIAXone (ROCEPHIN)  IV Stopped (03/30/21 1350)   feeding supplement (VITAL HIGH PROTEIN)     fentaNYL infusion INTRAVENOUS Stopped (03/30/21 1202)   lactated ringers     propofol (DIPRIVAN) infusion Stopped (03/30/21 0045)   vancomycin Stopped (03/30/21 1302)   PRN Meds:.sodium chloride, acetaminophen **OR** acetaminophen, fentaNYL, HYDROcodone-acetaminophen, ondansetron (ZOFRAN) IV Medications Prior to Admission:  Prior to Admission medications   Medication  Sig Start Date End Date Taking? Authorizing Provider  amLODipine (NORVASC) 10 MG tablet Take 1 tablet (10 mg total) by mouth daily. 12/03/20  Yes Kathlen Mody, MD  apixaban (ELIQUIS) 5 MG TABS tablet Take 5 mg by mouth 2 (two) times daily.   Yes [provider]  atorvastatin (LIPITOR) 10 MG tablet Take 10 mg by mouth daily.   Yes [provider]  calcium carbonate (TUMS - DOSED IN MG ELEMENTAL CALCIUM) 500 MG chewable tablet Chew 500 mg by mouth daily.   Yes [provider]  carvedilol (COREG) 6.25 MG tablet Take 1 tablet (6.25 mg total) by mouth 2 (two) times daily with a meal. 12/02/20  Yes Kathlen Mody, MD  Cholecalciferol (VITAMIN D) 125 MCG (5000 UT) CAPS Take 10,000 Units by mouth once a week. Wednesday   Yes [provider]  diphenhydrAMINE (BENADRYL) 2 % cream Apply 1 application topically 3 (three) times daily as needed for itching.   Yes [provider]  divalproex (DEPAKOTE) 500 MG DR tablet Take 500 mg by mouth 2 (two) times daily.   Yes [provider]  docusate sodium (COLACE) 100 MG capsule Take 1 capsule (100 mg total) by mouth 2 (two) times daily as needed for mild constipation.  01/05/20  Yes Regalado, Belkys A, MD  ferrous sulfate 324 MG TBEC Take 324 mg by mouth daily.   Yes [provider]  Ipratropium-Albuterol (COMBIVENT RESPIMAT) 20-100 MCG/ACT AERS respimat Inhale 1 puff into the lungs every 12 (twelve) hours.   Yes [provider]  JANUVIA 50 MG tablet Take 50 mg by mouth daily. 03/27/21  Yes [provider]  losartan (COZAAR) 25 MG tablet Take 25 mg by mouth daily. Hold for SBP<100 for HTN 03/27/21  Yes [provider]  Multiple Vitamin (MULTIVITAMIN WITH MINERALS) TABS tablet Take 1 tablet by mouth daily.   Yes [provider]  pantoprazole (PROTONIX) 40 MG tablet Take 1 tablet (40 mg total) by mouth 2 (two) times daily. Patient taking differently: Take 40 mg by mouth daily. 01/05/20  Yes Regalado, Belkys A, MD  polyethylene glycol (MIRALAX / GLYCOLAX) 17 g packet Take 17 g by mouth 2 (two) times daily. Patient taking differently: Take 17 g by mouth daily. 12/02/20  Yes Kathlen Mody, MD  risperiDONE (RISPERDAL) 2 MG tablet Take 2 mg by mouth 2 (two) times daily. 03/01/21  Yes [provider]  sennosides-docusate sodium (SENOKOT-S) 8.6-50 MG tablet Take 2 tablets by mouth daily.   Yes [provider]  tamsulosin (FLOMAX) 0.4 MG CAPS capsule Take 1 capsule (0.4 mg total) by mouth daily. Patient taking differently: Take 0.4 mg by mouth every other day. 12/03/20  Yes Kathlen Mody, MD  insulin aspart (NOVOLOG) 100 UNIT/ML injection CBG 70 - 120: 0 units CBG 121 - 150: 2 units CBG 151 - 200: 3 units CBG 201 - 250: 5 units CBG 251 - 300: 8 units CBG 301 - 350: 11 units CBG 351 - 400: 15 units 12/02/20   Kathlen Mody, MD   Allergies  Allergen Reactions   Chlorhexidine Gluconate Itching   Vancomycin     Vancomycin infusion related reaction- May need Vanc to be given at a slower rate    Acetazolamide Er Rash   Review of Systems Denies pain Physical Exam Awake alert sitting up in bed on the vent,  attempts to mouth words around her endotracheal tube and nods her head appropriately Regular work of breathing, diminished breath sounds towards bases  S1-S2 Monitor noted Has scar over her right thumb area on her hand Bilateral foot drop Vital Signs: BP 93/61   Pulse 66   Temp 100.2 F (37.9 C)   Resp 19   Ht 5\' 6"  (1.676 m)   Wt 105.2 kg   LMP  (LMP Unknown)   SpO2 99%   BMI 37.43 kg/m  Pain Scale: CPOT POSS *See Group Information*: S-Acceptable,Sleep, easy to arouse     SpO2: SpO2: 99 % O2 Device:SpO2: 99 % O2 Flow Rate: .O2 Flow Rate (L/min): 10 L/min  IO: Intake/output summary:  Intake/Output Summary (Last 24 hours) at 03/30/2021 1446 Last data filed at 03/30/2021 1400 Gross per 24 hour  Intake 1006.3 ml  Output 375 ml  Net 631.3 ml    LBM:   Baseline Weight: Weight: 90.7 kg Most recent weight: Weight: 105.2 kg     Palliative Assessment/Data:   PPS 40%  Time In:  1330 Time Out:  1430 Time Total:  60  Greater than 50%  of this time was spent counseling and coordinating care related to the above assessment and plan.  Signed by: 04/01/2021, MD   Please contact Palliative Medicine Team phone at [phone] for questions and concerns.  For individual provider: See [phone]

## 2021-03-30 NOTE — Op Note (Signed)
Intubation Procedure Note  Becky Hendricks  [id number]  19-Jan-1946  Date:03/30/21  Time:11:26 PM   Provider Performing:Darleene Cumpian R Ryken Paschal    Procedure: Intubation (31500)  Indication(s) Respiratory Failure Pt bit through the pilot tube  Consent This was a emergent procedure due to the compromised airway at the loss of the pilot tube.  Anesthesia Propofol   Time Out Verified patient identification, verified procedure, site/side was marked, verified correct patient position, special equipment/implants available, medications/allergies/relevant history reviewed, required imaging and test results available.   Sterile Technique Usual hand hygeine, masks, and gloves were used   Procedure Description Patient positioned in bed supine.  Sedation given as noted above.  Patient had her endotracheal tube with the damaged pilot tube withdrawn over the bougie tube exchanger.  A number 7.5 mm endotracheal tube was placed over the bougie and advanced to 25 cm at the lip. Colorimetric CO2 detector was consistent with tracheal placement.   Complications/Tolerance None; patient tolerated the procedure well. Chest X-ray is ordered to verify placement.   EBL None   Specimen(s) None

## 2021-03-30 NOTE — Progress Notes (Signed)
eLink Physician-Brief Progress Note Patient Name: Becky Hendricks DOB: July 06, 1946 MRN: [id number]   Date of Service  03/30/2021  HPI/Events of Note  Hypotension - BP = 77/36 with MAP = 49.  eICU Interventions  Plan: Bolus with 0.9 NaCl 1 liter IV over 1 hour now. Phenylephrine iV infusion via PIV. Titrate to MAP >= 65.      Intervention Category Major Interventions: Hypotension - evaluation and management  Lenell Antu 03/30/2021, 8:47 PM

## 2021-03-31 DIAGNOSIS — J189 Pneumonia, unspecified organism: Secondary | ICD-10-CM | POA: Diagnosis not present

## 2021-03-31 DIAGNOSIS — G9341 Metabolic encephalopathy: Secondary | ICD-10-CM | POA: Diagnosis not present

## 2021-03-31 DIAGNOSIS — N390 Urinary tract infection, site not specified: Secondary | ICD-10-CM

## 2021-03-31 LAB — GLUCOSE, CAPILLARY
Glucose-Capillary: 161 mg/dL — ABNORMAL HIGH (ref 70–99)
Glucose-Capillary: 162 mg/dL — ABNORMAL HIGH (ref 70–99)
Glucose-Capillary: 119 mg/dL — ABNORMAL HIGH (ref 70–99)
Glucose-Capillary: 120 mg/dL — ABNORMAL HIGH (ref 70–99)
Glucose-Capillary: 114 mg/dL — ABNORMAL HIGH (ref 70–99)

## 2021-03-31 LAB — T3: T3, Total: 79 ng/dL (ref 71–180)

## 2021-03-31 LAB — LEGIONELLA PNEUMOPHILA SEROGP 1 UR AG: L. pneumophila Serogp 1 Ur Ag: NEGATIVE

## 2021-03-31 LAB — CREATININE, SERUM
Creatinine, Ser: 0.69 mg/dL (ref 0.44–1.00)
GFR, Estimated: >60 mL/min (ref >60)

## 2021-03-31 MED ORDER — ACETAMINOPHEN 650 MG RE SUPP: 650.0000 mg | Freq: Four times a day (QID) | RECTAL | Status: DC | PRN

## 2021-03-31 MED ORDER — VALPROIC ACID 250 MG/5ML PO SOLN
500.0000 mg | Freq: Two times a day (BID) | ORAL | Status: DC
  Administered 2021-03-31: 500 mg via ORAL
  Filled 2021-03-31 (×2): qty 10

## 2021-03-31 MED ORDER — APIXABAN 5 MG PO TABS
5.0000 mg | ORAL_TABLET | Freq: Two times a day (BID) | ORAL | Status: DC
  Administered 2021-03-31 – 2021-04-07 (×13): 5 mg via ORAL
  Filled 2021-03-31 (×14): qty 1

## 2021-03-31 MED ORDER — POLYETHYLENE GLYCOL 3350 17 G PO PACK: 17.0000 g | PACK | Freq: Every day | ORAL | Status: DC

## 2021-03-31 MED ORDER — ACETAMINOPHEN 325 MG PO TABS
650.0000 mg | ORAL_TABLET | Freq: Four times a day (QID) | ORAL | Status: DC | PRN
  Administered 2021-04-01: 650 mg via ORAL
  Filled 2021-03-31: qty 2

## 2021-03-31 NOTE — Progress Notes (Signed)
eLink Physician-Brief Progress Note Patient Name: Becky Hendricks DOB: August 27, 1945 MRN: [id number]   Date of Service  03/31/2021  HPI/Events of Note  Agitation - Nursing request to renew bilateral soft wrist restraints.   eICU Interventions  Will renew bilateral soft wrist restraints X 9 hours.        [name] 03/31/2021, 12:02 AM

## 2021-03-31 NOTE — Progress Notes (Signed)
Administered 3 mL propofol per MD Dorothy for intubation, rest of bottle (97 mL) WIS, witnessed by Jackelyn Knife RN

## 2021-03-31 NOTE — Progress Notes (Signed)
OT Cancellation Note  Patient Details Name: Conya Ellinwood MRN: [id number] DOB: [birth year]   Cancelled Treatment:    Reason Eval/Treat Not Completed: Medical issues which prohibited therapy patient intubated at this time prohibiting therapy. Please reorder when extubated.  Sharyn Blitz OTR/L, MS Acute Rehabilitation Department Office# [phone] Pager# [phone]   Chalmers Guest Jhania Etherington 03/31/2021, 6:39 AM

## 2021-03-31 NOTE — Procedures (Signed)
Extubation Procedure Note  Patient Details:   Name: Becky Hendricks DOB: 1945/09/14 MRN: [id number]   Airway Documentation:    Vent end date: 03/31/21 Vent end time: 1028   Evaluation  O2 sats: stable throughout Complications: No apparent complications Patient did tolerate procedure well. Bilateral Breath Sounds: Diminished   Yes  Pt extubated to 3L [place], currently tolerating well at this time with saturations of 96%. Pt was suctioned and had a positive cuff leak prior to extubation. No stridor heard at this time, RT will continue to monitor pt status.   Marlene Pfluger A Danicia Terhaar 03/31/2021, 10:28 AM

## 2021-03-31 NOTE — Progress Notes (Signed)
   NAME:  Becky Hendricks, MRN:  [id number], DOB:  1946/03/14, LOS: 2 ADMISSION DATE:  03/29/2021, CONSULTATION DATE:  03/30/21 REFERRING MD:  ER , CHIEF COMPLAINT:  ALOC   Brief History:  75y/o female that had a 2 week hx of progressive generalized weakness presented to the ER 8/17 for [facility] and resp distress.  The pt presented from [facility].  Once in the ER the pt was found to have a ph of 7.084 and required emergent intubation. The pt had a similar presentation in April of this year and was found to have MRSA bacteriemia at that time.  Concern for PNA, UTI on admit as source of sepsis.   Pertinent  Medical History  HTN DM Afib Obesity  HFpF Seizure disorder Bipolar Disorder   Significant Hospital Events: Including procedures, antibiotic start and stop dates in addition to other pertinent events   8/17 Admit with ALOC, weakness, respiratory distress.  CT head negative for acute intracranial process, possible sinusitis, CT abd/pelvis with mild bilateral posterior subsegmental atelectasis + pleural effusions, large amt of stool in distal sigmoid colon 8/19 Required reintubation as pt was able to bite pilot balloon in half. Extubated later in am.    Objective   Blood pressure (!) 84/64, pulse 60, temperature 98.8 F (37.1 C), resp. rate 16, height 5\' 6"  (1.676 m), weight 108.3 kg, SpO2 100 %.    Vent Mode: PRVC FiO2 (%):  [40 %] 40 % Set Rate:  [18 bmp] 18 bmp Vt Set:  [470 mL] 470 mL PEEP:  [5 cmH20] 5 cmH20 Plateau Pressure:  [18 cmH20-23 cmH20] 23 cmH20   Intake/Output Summary (Last 24 hours) at 03/31/2021 04/02/2021 Last data filed at 03/31/2021 0700 Gross per 24 hour  Intake 2098.06 ml  Output 1900 ml  Net 198.06 ml   Filed Weights   03/30/21 0030 03/30/21 0500 03/31/21 0500  Weight: 105.2 kg 105.2 kg 108.3 kg    Examination: General: adult female lying in bed in NAD on vent HEENT: MM pink/moist, ETT, anicteric  Neuro: sedate CV: s1s2 RRR, no m/r/g PULM:   non-labored on vent, lungs bilaterally clear, sedation turned off per RN for WUA GI: soft, bsx4 active  Extremities: warm/dry, trace dependent edema  Skin: no rashes or lesions   Assessment & Plan:   Acute Hypercarbic Respiratory Failure PNA (NOS, present on admit) -PRVC 8cc/kg  -reduce rate to 12, allow patient to set rate  -RASS Goal 0 to -1 -follow intermittent CXR  -daily SBT / WUA   Septic Shock secondary to PNA, GNR UTI -narrow abx to ceftriaxone  -follow urine, blood cultures to maturity  -vasopressors for MAP >65  Acute Metabolic Encephalopathy  Seizure Disorder Biploar Disorder  In setting of sepsis, hx of seizures but none noted at this time  -supportive care -continue valproic acid  -follow frequent neuro exam   Bradycardia PAF -tele monitoring   DM  -SSI, sensitive scale     Best Practice (right click and "Reselect all SmartList Selections" daily)  Diet/type: NPO DVT prophylaxis: LMWH GI prophylaxis: PPI Lines: N/A Foley:  Yes, and it is still needed Code Status:  full code  Critical care time: 34 minutes    04/02/21, MSN, APRN, NP-C, AGACNP-BC [facility] 03/31/2021, 9:08 AM   Please see [email or web address] for pager details.   From 7A-7P if no response, please call [phone] After hours, please call ELink [phone]

## 2021-03-31 NOTE — Progress Notes (Signed)
Assessed patient post extubation - tolerating well.  Normal work of breathing.  Communicates without difficulty.  On [place] O2.     Change to SDU status, observe overnight on SDU.  To TRH as of 8/20 0700.    Canary Brim, MSN, APRN, NP-C, AGACNP-BC [facility] 03/31/2021, 4:02 PM   Please see [email or web address] for pager details.   From 7A-7P if no response, please call [phone] After hours, please call ELink [phone]

## 2021-04-01 ENCOUNTER — Inpatient Hospital Stay (HOSPITAL_COMMUNITY): Payer: Medicare Other

## 2021-04-01 ENCOUNTER — Other Ambulatory Visit: Payer: Self-pay

## 2021-04-01 DIAGNOSIS — J9601 Acute respiratory failure with hypoxia: Secondary | ICD-10-CM | POA: Diagnosis not present

## 2021-04-01 DIAGNOSIS — A419 Sepsis, unspecified organism: Secondary | ICD-10-CM | POA: Diagnosis not present

## 2021-04-01 DIAGNOSIS — Z794 Long term (current) use of insulin: Secondary | ICD-10-CM

## 2021-04-01 DIAGNOSIS — I480 Paroxysmal atrial fibrillation: Secondary | ICD-10-CM

## 2021-04-01 DIAGNOSIS — F3160 Bipolar disorder, current episode mixed, unspecified: Secondary | ICD-10-CM

## 2021-04-01 DIAGNOSIS — E1165 Type 2 diabetes mellitus with hyperglycemia: Secondary | ICD-10-CM

## 2021-04-01 DIAGNOSIS — J9602 Acute respiratory failure with hypercapnia: Secondary | ICD-10-CM

## 2021-04-01 DIAGNOSIS — R532 Functional quadriplegia: Secondary | ICD-10-CM

## 2021-04-01 DIAGNOSIS — G9341 Metabolic encephalopathy: Secondary | ICD-10-CM | POA: Diagnosis not present

## 2021-04-01 LAB — GLUCOSE, CAPILLARY
Glucose-Capillary: 106 mg/dL — ABNORMAL HIGH (ref 70–99)
Glucose-Capillary: 111 mg/dL — ABNORMAL HIGH (ref 70–99)
Glucose-Capillary: 117 mg/dL — ABNORMAL HIGH (ref 70–99)
Glucose-Capillary: 139 mg/dL — ABNORMAL HIGH (ref 70–99)
Glucose-Capillary: 146 mg/dL — ABNORMAL HIGH (ref 70–99)
Glucose-Capillary: 152 mg/dL — ABNORMAL HIGH (ref 70–99)

## 2021-04-01 LAB — CBC
HCT: 38.9 % (ref 36.0–46.0)
Hemoglobin: 11.6 g/dL — ABNORMAL LOW (ref 12.0–15.0)
MCH: 27.9 pg (ref 26.0–34.0)
MCHC: 29.8 g/dL — ABNORMAL LOW (ref 30.0–36.0)
MCV: 93.5 fL (ref 80.0–100.0)
Platelets: 192 10*3/uL (ref 150–400)
RBC: 4.16 MIL/uL (ref 3.87–5.11)
RDW: 15.9 % — ABNORMAL HIGH (ref 11.5–15.5)
WBC: 10.3 10*3/uL (ref 4.0–10.5)
nRBC: 0 % (ref 0.0–0.2)

## 2021-04-01 LAB — BASIC METABOLIC PANEL
Anion gap: 8 (ref 5–15)
BUN: 14 mg/dL (ref 8–23)
CO2: 29 mmol/L (ref 22–32)
Calcium: 8.2 mg/dL — ABNORMAL LOW (ref 8.9–10.3)
Chloride: 101 mmol/L (ref 98–111)
Creatinine, Ser: 0.47 mg/dL (ref 0.44–1.00)
GFR, Estimated: >60 mL/min (ref >60)
Glucose, Bld: 115 mg/dL — ABNORMAL HIGH (ref 70–99)
Potassium: 3.8 mmol/L (ref 3.5–5.1)
Sodium: 138 mmol/L (ref 135–145)

## 2021-04-01 LAB — MAGNESIUM: Magnesium: 1.9 mg/dL (ref 1.7–2.4)

## 2021-04-01 LAB — URINE CULTURE: Culture: >=100000 — AB

## 2021-04-01 IMAGING — DX DG CHEST 1V PORT
1 series · 1 of 1 positions shown · non-contrast
Comparison: Two days ago

CLINICAL DATA: Acute respiratory failure

EXAM:
PORTABLE CHEST 1 VIEW

[chest ap]
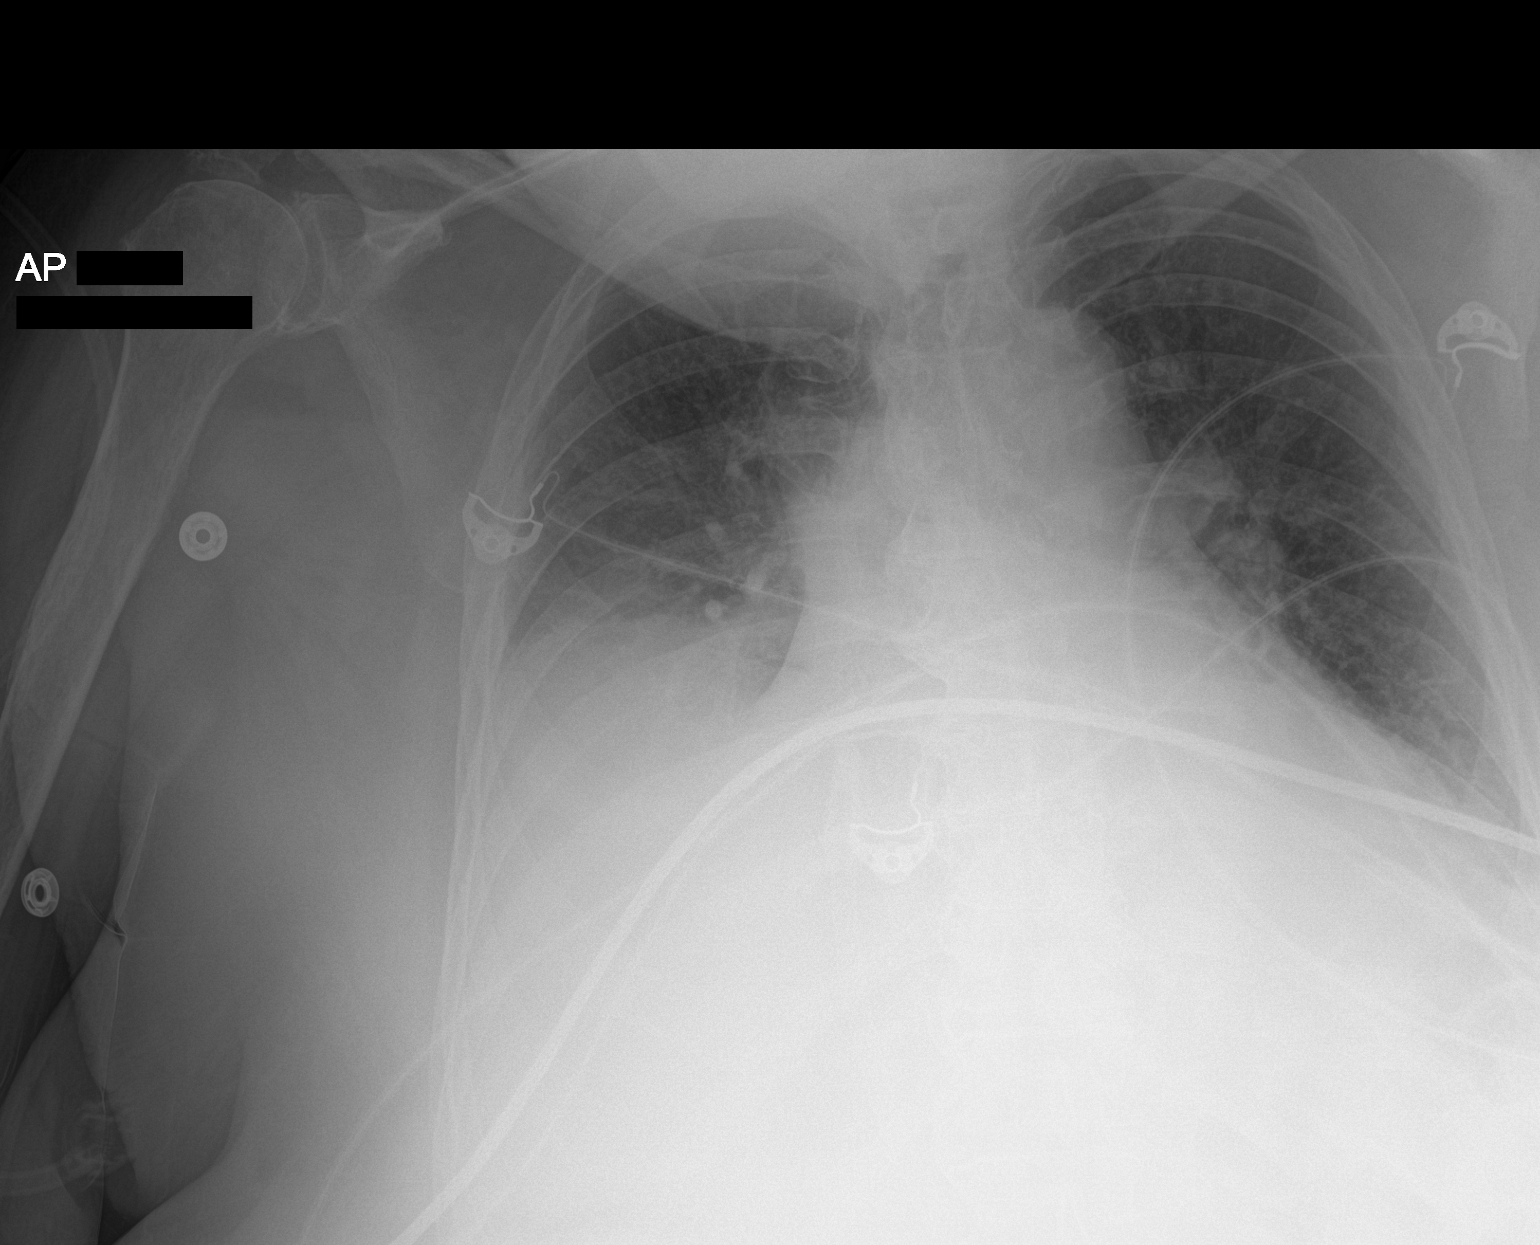

[1 of 1 positions shown; findings below may reference images not displayed]

FINDINGS: Extubation of the trachea and esophagus. Very low volume chest with
dense opacity on the right more than left. Cardiopericardial
enlargement. No visible pneumothorax.
IMPRESSION: Low volume chest with dense opacity at the bases that correlates
with atelectasis on recent abdominal CT. No worsening after
extubation.

## 2021-04-01 MED ORDER — FLUTICASONE PROPIONATE 50 MCG/ACT NA SUSP
2.0000 | Freq: Every day | NASAL | Status: DC
Start: 2021-04-01 — End: 2021-04-07
  Administered 2021-04-01 – 2021-04-07 (×6): 2 via NASAL
  Filled 2021-04-01: qty 16

## 2021-04-01 MED ORDER — DIVALPROEX SODIUM 500 MG PO DR TAB
500.0000 mg | DELAYED_RELEASE_TABLET | Freq: Two times a day (BID) | ORAL | Status: DC
  Administered 2021-04-01 (×2): 500 mg via ORAL
  Filled 2021-04-01 (×3): qty 1

## 2021-04-01 MED ORDER — CARVEDILOL 6.25 MG PO TABS
6.2500 mg | ORAL_TABLET | Freq: Two times a day (BID) | ORAL | Status: DC
  Administered 2021-04-01: 6.25 mg via ORAL
  Filled 2021-04-01: qty 1

## 2021-04-01 MED ORDER — METOPROLOL TARTRATE 5 MG/5ML IV SOLN
INTRAVENOUS | Status: AC
  Administered 2021-04-01: 5 mg via INTRAVENOUS
  Filled 2021-04-01: qty 5

## 2021-04-01 MED ORDER — FERROUS SULFATE 325 (65 FE) MG PO TABS
325.0000 mg | ORAL_TABLET | Freq: Every day | ORAL | Status: DC
  Administered 2021-04-01 – 2021-04-07 (×6): 325 mg via ORAL
  Filled 2021-04-01 (×7): qty 1

## 2021-04-01 MED ORDER — PANTOPRAZOLE SODIUM 40 MG PO TBEC
40.0000 mg | DELAYED_RELEASE_TABLET | Freq: Every day | ORAL | Status: DC
  Filled 2021-04-01: qty 1

## 2021-04-01 MED ORDER — METOPROLOL TARTRATE 5 MG/5ML IV SOLN
5.0000 mg | INTRAVENOUS | Status: DC | PRN
  Administered 2021-04-04: 5 mg via INTRAVENOUS
  Filled 2021-04-01: qty 5

## 2021-04-01 MED ORDER — ORAL CARE MOUTH RINSE
15.0000 mL | Freq: Two times a day (BID) | OROMUCOSAL | Status: DC
  Administered 2021-04-01 – 2021-04-07 (×13): 15 mL via OROMUCOSAL

## 2021-04-01 MED ORDER — BISACODYL 10 MG RE SUPP
10.0000 mg | Freq: Once | RECTAL | Status: AC
  Administered 2021-04-01: 10 mg via RECTAL
  Filled 2021-04-01: qty 1

## 2021-04-01 MED ORDER — INSULIN ASPART 100 UNIT/ML IJ SOLN
0.0000 [IU] | Freq: Three times a day (TID) | INTRAMUSCULAR | Status: DC
  Administered 2021-04-01 – 2021-04-02 (×2): 1 [IU] via SUBCUTANEOUS
  Administered 2021-04-03: 3 [IU] via SUBCUTANEOUS
  Administered 2021-04-03: 5 [IU] via SUBCUTANEOUS
  Administered 2021-04-03 – 2021-04-05 (×4): 2 [IU] via SUBCUTANEOUS
  Administered 2021-04-05: 3 [IU] via SUBCUTANEOUS
  Administered 2021-04-06: 2 [IU] via SUBCUTANEOUS
  Administered 2021-04-06: 1 [IU] via SUBCUTANEOUS
  Administered 2021-04-06 – 2021-04-07 (×2): 3 [IU] via SUBCUTANEOUS
  Administered 2021-04-07: 1 [IU] via SUBCUTANEOUS

## 2021-04-01 MED ORDER — POLYETHYLENE GLYCOL 3350 17 G PO PACK
17.0000 g | PACK | Freq: Two times a day (BID) | ORAL | Status: DC
  Administered 2021-04-01 – 2021-04-07 (×4): 17 g via ORAL
  Filled 2021-04-01 (×6): qty 1

## 2021-04-01 MED ORDER — SENNOSIDES-DOCUSATE SODIUM 8.6-50 MG PO TABS
2.0000 | ORAL_TABLET | Freq: Every day | ORAL | Status: DC
  Filled 2021-04-01 (×2): qty 2

## 2021-04-01 MED ORDER — LORATADINE 10 MG PO TABS
10.0000 mg | ORAL_TABLET | Freq: Every day | ORAL | Status: DC
  Administered 2021-04-01 – 2021-04-07 (×6): 10 mg via ORAL
  Filled 2021-04-01 (×7): qty 1

## 2021-04-01 MED ORDER — IPRATROPIUM-ALBUTEROL 0.5-2.5 (3) MG/3ML IN SOLN
3.0000 mL | Freq: Three times a day (TID) | RESPIRATORY_TRACT | Status: DC
  Administered 2021-04-01 – 2021-04-02 (×4): 3 mL via RESPIRATORY_TRACT
  Filled 2021-04-01 (×4): qty 3

## 2021-04-01 MED ORDER — RISPERIDONE 1 MG PO TABS
2.0000 mg | ORAL_TABLET | Freq: Two times a day (BID) | ORAL | Status: DC
  Administered 2021-04-01 (×2): 2 mg via ORAL
  Filled 2021-04-01 (×3): qty 2

## 2021-04-01 MED ORDER — FUROSEMIDE 10 MG/ML IJ SOLN
20.0000 mg | Freq: Once | INTRAMUSCULAR | Status: AC
  Administered 2021-04-01: 20 mg via INTRAVENOUS
  Filled 2021-04-01: qty 2

## 2021-04-01 NOTE — Progress Notes (Signed)
PROGRESS NOTE    Becky Hendricks  CVE:[id number] DOB: 08-May-1946 DOA: 03/29/2021 PCP: Roderic Scarce, MD    Chief Complaint  Patient presents with   Fatigue   Altered Mental Status   Hypoxia    Brief Narrative:  75y/o female that had a 2 week hx of progressive generalized weakness presented to the ER 8/17 for [facility] and resp distress.  The pt presented from [facility].  Once in the ER the pt was found to have a ph of 7.084 and required emergent intubation. The pt had a similar presentation in April of this year and was found to have MRSA bacteriemia at that time.  Concern for PNA, UTI on admit as source of sepsis.    Assessment & Plan:   Active Problems:   Metabolic encephalopathy   Urinary tract infection without hematuria   Severe sepsis (HCC)   AF (paroxysmal atrial fibrillation) (HCC)   Sacral wound   Bipolar disorder (HCC)   Type 2 diabetes mellitus with hyperglycemia, with long-term current use of insulin (HCC)   Acute respiratory failure with hypoxia (HCC)   Hyponatremia   Functional quadriplegia (HCC)   Pneumonia of lower lobe due to infectious organism   Colitis   Acute respiratory failure (HCC)   1 acute hypoxic hypercarbic respiratory failure/pneumonia, POA -Patient was intubated on admission and placed empirically on IV antibiotics. -Patient extubated currently on 3 L nasal cannula. -IV antibiotics narrowed down to Rocephin which we will continue. -Continue to wean O2. -Place on scheduled duo nebs. -Lasix 20 mg IV x1. -Follow.  2.  Severe sepsis with septic shock secondary to pneumonia/Proteus Mirabilis UTI, POA -Clinical improvement. -Currently not on pressors. -Urine cultures with > 100,000 colonies of Proteus Mirabilis -Blood cultures pending with no growth to date. -Fever curve trending down. -Urine Legionella antigen negative, urine pneumococcus antigen negative. -IV antibiotics narrowed down to IV Rocephin which we will  continue. -Follow-up.  3.  Acute metabolic encephalopathy/seizure disorder/bipolar disorder -Acute metabolic encephalopathy likely secondary to sepsis infection. -Patient with no noted seizures. -Clinically improving. -Continue empiric IV antibiotics for UTI. -Continue Depakote. -Resume home regimen respite all. -Follow.  4.  Paroxysmal A. fib/bradycardia -Stable. -Currently rate controlled. -Eliquis for anticoagulation.  5.  Diabetes mellitus type 2 -Hemoglobin A1c 7.0  -CBG of 117 this morning. -SSI  6.  Deconditioned -PT/OT. -We will likely need SNF placement.  7.  Constipation -MiraLAX twice daily, Senokot-S daily.         DVT prophylaxis: Eliquis Code Status: Full Family Communication: Updated patient.  No family at bedside. Disposition:   Status is: Inpatient  Remains inpatient appropriate because:Inpatient level of care appropriate due to severity of illness  Dispo: The patient is from: SNF              Anticipated d/c is to: SNF              Patient currently is not medically stable to d/c.   Difficult to place patient No       Consultants:  None.  Procedures:  Chest x-ray 04/01/2021, 03/30/2021, 03/29/2021 CT abdomen and pelvis 03/29/2021 CT head 03/29/2021 Abdominal films 03/29/2021  Significant Hospital Events: Including procedures, antibiotic start and stop dates in addition to other pertinent events   8/17 Admit with ALOC, weakness, respiratory distress.  CT head negative for acute intracranial process, possible sinusitis, CT abd/pelvis with mild bilateral posterior subsegmental atelectasis + pleural effusions, large amt of stool in distal sigmoid colon 8/19 Required reintubation  as pt was able to bite pilot balloon in half. Extubated later in am.     Antimicrobials:  IV azithromycin 8/18/ 2022x1 IV Rocephin 03/30/2021>>>>>   Subjective: Patient laying in bed.  Sleeping but arousable.  Denies any chest pain.  No shortness of breath.  No  abdominal pain.  Alert and oriented to self place.  Thinks it is 2020. States due to lack of exercise has been bedridden for several months.  Objective: Vitals:   04/01/21 0500 04/01/21 0600 04/01/21 0700 04/01/21 0830  BP: (!) 105/44 (!) 127/51 (!) 108/46   Pulse: 76 73 74   Resp: (!) 27 (!) 25 (!) 26   Temp: 99.1 F (37.3 C) 99.3 F (37.4 C) 99.7 F (37.6 C)   TempSrc:      SpO2: 94% 96% 99% 92%  Weight:      Height:        Intake/Output Summary (Last 24 hours) at 04/01/2021 1016 Last data filed at 04/01/2021 0630 Gross per 24 hour  Intake 529.33 ml  Output 1800 ml  Net -1270.67 ml   Filed Weights   03/30/21 0500 03/31/21 0500 04/01/21 0335  Weight: 105.2 kg 108.3 kg 107.7 kg    Examination:  General exam: NAD Respiratory system: Decreased breath sounds in the bases.  No rhonchi.  Fair air movement.  Speaking in full sentences.   Cardiovascular system: S1 & S2 heard, RRR. No JVD, murmurs, rubs, gallops or clicks. No pedal edema. Gastrointestinal system: Abdomen is nondistended, soft and nontender. No organomegaly or masses felt. Normal bowel sounds heard. Central nervous system: Alert and oriented to self and place. No focal neurological deficits. Extremities: Symmetric 5 x 5 power. Skin: No rashes, lesions or ulcers Psychiatry: Judgement and insight appear normal. Mood & affect appropriate.     Data Reviewed: I have personally reviewed following labs and imaging studies  CBC: Recent Labs  Lab 03/29/21 1614 03/30/21 0311 04/01/21 0242  WBC 12.7* 8.3 10.3  NEUTROABS 7.7 6.2  --   HGB 12.2 10.8* 11.6*  HCT 39.1 34.6* 38.9  MCV 91.8 91.3 93.5  PLT 193 169 192    Basic Metabolic Panel: Recent Labs  Lab 03/29/21 1614 03/29/21 2130 03/30/21 0311 03/31/21 0308 04/01/21 0242  NA 132*  --  131*  --  138  K 4.9  --  4.9  --  3.8  CL 87*  --  91*  --  101  CO2 36*  --  30  --  29  GLUCOSE 121*  --  129*  --  115*  BUN 16  --  22  --  14  CREATININE 0.55   --  0.53 0.69 0.47  CALCIUM 8.6*  --  8.3*  --  8.2*  MG  --  2.0 1.8  --  1.9  PHOS  --  6.3* 4.3  --   --     GFR: Estimated Creatinine Clearance: 77.8 mL/min (by C-G formula based on SCr of 0.47 mg/dL).  Liver Function Tests: Recent Labs  Lab 03/29/21 1614 03/30/21 0311  AST 9* 12*  ALT 9 9  ALKPHOS 39 35*  BILITOT 0.4 0.4  PROT 6.3* 5.4*  ALBUMIN 3.1* 2.6*    CBG: Recent Labs  Lab 03/31/21 1552 03/31/21 1956 04/01/21 0002 04/01/21 0329 04/01/21 0810  GLUCAP 120* 114* 152* 106* 117*     Recent Results (from the past 240 hour(s))  Resp Panel by RT-PCR (Flu A&B, Covid) Nasopharyngeal Swab  Status: None   Collection Time: 03/29/21  4:12 PM   Specimen: Nasopharyngeal Swab; Nasopharyngeal(NP) swabs in vial transport medium  Result Value Ref Range Status   SARS Coronavirus 2 by RT PCR NEGATIVE NEGATIVE Final    Comment: (NOTE) SARS-CoV-2 target nucleic acids are NOT DETECTED.  The SARS-CoV-2 RNA is generally detectable in upper respiratory specimens during the acute phase of infection. The lowest concentration of SARS-CoV-2 viral copies this assay can detect is 138 copies/mL. A negative result does not preclude SARS-Cov-2 infection and should not be used as the sole basis for treatment or other patient management decisions. A negative result may occur with  improper specimen collection/handling, submission of specimen other than nasopharyngeal swab, presence of viral mutation(s) within the areas targeted by this assay, and inadequate number of viral copies(<138 copies/mL). A negative result must be combined with clinical observations, patient history, and epidemiological information. The expected result is Negative.  Fact Sheet for Patients:  BloggerCourse.com  Fact Sheet for Healthcare Providers:  SeriousBroker.it  This test is no t yet approved or cleared by the [place] FDA and  has been authorized  for detection and/or diagnosis of SARS-CoV-2 by FDA under an Emergency Use Authorization (EUA). This EUA will remain  in effect (meaning this test can be used) for the duration of the COVID-19 declaration under Section 564(b)(1) of the Act, 21 U.S.C.section 360bbb-3(b)(1), unless the authorization is terminated  or revoked sooner.       Influenza A by PCR NEGATIVE NEGATIVE Final   Influenza B by PCR NEGATIVE NEGATIVE Final    Comment: (NOTE) The Xpert Xpress SARS-CoV-2/FLU/RSV plus assay is intended as an aid in the diagnosis of influenza from Nasopharyngeal swab specimens and should not be used as a sole basis for treatment. Nasal washings and aspirates are unacceptable for Xpert Xpress SARS-CoV-2/FLU/RSV testing.  Fact Sheet for Patients: BloggerCourse.com  Fact Sheet for Healthcare Providers: SeriousBroker.it  This test is not yet approved or cleared by the [place] FDA and has been authorized for detection and/or diagnosis of SARS-CoV-2 by FDA under an Emergency Use Authorization (EUA). This EUA will remain in effect (meaning this test can be used) for the duration of the COVID-19 declaration under Section 564(b)(1) of the Act, 21 U.S.C. section 360bbb-3(b)(1), unless the authorization is terminated or revoked.  Performed at [facility], [address], [place], [place] [address]   Culture, blood (single)     Status: None (Preliminary result)   Collection Time: 03/29/21  4:46 PM   Specimen: BLOOD  Result Value Ref Range Status   Specimen Description   Final    BLOOD BLOOD RIGHT FOREARM Performed at [facility], [address] [place], [place] [address]    Special Requests   Final    BOTTLES DRAWN AEROBIC AND ANAEROBIC Blood Culture adequate volume Performed at [facility], [address] [place], [place] [address]    Culture   Final    NO GROWTH 3 DAYS Performed  at [facility], [address], [place], [place] [address]    Report Status PENDING  Incomplete  Urine Culture     Status: Abnormal   Collection Time: 03/29/21  8:46 PM   Specimen: Urine, Clean Catch  Result Value Ref Range Status   Specimen Description   Final    URINE, CLEAN CATCH Performed at [facility], [address] [place], [place] [address]    Special Requests  Final    NONE Performed at [facility], [address], [place], [place] [address]    Culture >=100,000 COLONIES/mL PROTEUS MIRABILIS (A)  Final   Report Status 04/01/2021 FINAL  Final   Organism ID, Bacteria PROTEUS MIRABILIS (A)  Final      Susceptibility   Proteus mirabilis - MIC*    AMPICILLIN <=2 SENSITIVE Sensitive     CEFAZOLIN <=4 SENSITIVE Sensitive     CEFEPIME <=0.12 SENSITIVE Sensitive     CEFTRIAXONE <=0.25 SENSITIVE Sensitive     CIPROFLOXACIN <=0.25 SENSITIVE Sensitive     GENTAMICIN <=1 SENSITIVE Sensitive     IMIPENEM 1 SENSITIVE Sensitive     NITROFURANTOIN 128 RESISTANT Resistant     TRIMETH/SULFA <=20 SENSITIVE Sensitive     AMPICILLIN/SULBACTAM <=2 SENSITIVE Sensitive     PIP/TAZO <=4 SENSITIVE Sensitive     * >=100,000 COLONIES/mL PROTEUS MIRABILIS  Culture, blood (single)     Status: None (Preliminary result)   Collection Time: 03/29/21  9:30 PM   Specimen: BLOOD  Result Value Ref Range Status   Specimen Description   Final    BLOOD RIGHT ANTECUBITAL Performed at [facility], [address] [place], [place] [address]    Special Requests   Final    BOTTLES DRAWN AEROBIC AND ANAEROBIC Blood Culture adequate volume Performed at [facility], [address] [place], [place] [address]    Culture   Final    NO GROWTH 3 DAYS Performed at [facility], [address] [place], [place] [address]    Report Status PENDING  Incomplete  MRSA Next Gen by PCR, Nasal     Status: Abnormal   Collection  Time: 03/30/21 12:00 AM   Specimen: Nasal Mucosa; Nasal Swab  Result Value Ref Range Status   MRSA by PCR Next Gen DETECTED (A) NOT DETECTED Final    Comment: RESULT CALLED TO, READ BACK BY AND VERIFIED WITH: KAREN, RN @ 0403 ON 03/30/21 C VARNER (NOTE) The GeneXpert MRSA Assay (FDA approved for NASAL specimens only), is one component of a comprehensive MRSA colonization surveillance program. It is not intended to diagnose MRSA infection nor to guide or monitor treatment for MRSA infections. Test performance is not FDA approved in patients less than 48 years old. Performed at [facility], [address] [place], [place] [address]   Culture, blood (x 2)     Status: None (Preliminary result)   Collection Time: 03/30/21  1:53 AM   Specimen: BLOOD  Result Value Ref Range Status   Specimen Description   Final    BLOOD BLOOD LEFT HAND Performed at [facility], [address], [place], [place] [address]    Special Requests   Final    BOTTLES DRAWN AEROBIC ONLY Blood Culture adequate volume Performed at [facility], [address] [place], [place] [address]    Culture   Final    NO GROWTH 2 DAYS Performed at [facility], [address], [place], [place] [address]    Report Status PENDING  Incomplete  Culture, blood (x 2)     Status: None (Preliminary result)   Collection Time: 03/30/21  3:11 AM   Specimen: BLOOD  Result Value Ref Range Status   Specimen Description   Final    BLOOD BLOOD LEFT HAND Performed at [facility], [address], [place], [place] [address]    Special Requests  Final    BOTTLES DRAWN AEROBIC ONLY Blood Culture adequate volume Performed at [facility], [address] [place], [place] [address]    Culture   Final    NO GROWTH 2 DAYS Performed at [facility], [address] [place], [place] [address]    Report Status PENDING  Incomplete          Radiology Studies: DG CHEST PORT 1 VIEW  Result Date: 04/01/2021 CLINICAL DATA:  Acute respiratory failure EXAM: PORTABLE CHEST 1 VIEW COMPARISON:  Two days ago FINDINGS: Extubation of the trachea and esophagus. Very low volume chest with dense opacity on the right more than left. Cardiopericardial enlargement. No visible pneumothorax. IMPRESSION: Low volume chest with dense opacity at the bases that correlates with atelectasis on recent abdominal CT. No worsening after extubation. Electronically Signed   By: Marnee SpringJonathon  Watts M.D.   On: 04/01/2021 07:32   DG CHEST PORT 1 VIEW  Result Date: 03/30/2021 CLINICAL DATA:  Status post re-intubation EXAM: PORTABLE CHEST 1 VIEW COMPARISON:  Film from earlier in the same day. FINDINGS: Endotracheal tube is noted 2 cm above the carina. Gastric catheter extends into the stomach. Cardiac shadow is enlarged but stable. Bilateral pleural effusions are again noted. Parenchymal opacities are seen as well. IMPRESSION: Endotracheal tube in satisfactory position following re-intubation. Remainder of the study is stable. Electronically Signed   By: Alcide CleverMark  Lukens M.D.   On: 03/30/2021 23:54        Scheduled Meds:  apixaban  5 mg Oral BID   atorvastatin  10 mg Oral Daily   bisacodyl  10 mg Rectal Once   divalproex  500 mg Oral Q12H   ferrous sulfate  325 mg Oral Daily   fluticasone  2 spray Each Nare Daily   insulin aspart  0-9 Units Subcutaneous TID WC   ipratropium-albuterol  3 mL Nebulization TID   loratadine  10 mg Oral Daily   mouth rinse  15 mL Mouth Rinse BID   mupirocin ointment  1 application Nasal BID   pantoprazole  40 mg Oral Daily   polyethylene glycol  17 g Oral BID   risperiDONE  2 mg Oral BID   senna-docusate  2 tablet Oral QHS   Continuous Infusions:  sodium chloride 10 mL/hr at 03/30/21 2153   sodium chloride 10 mL/hr at 04/01/21 0630   cefTRIAXone (ROCEPHIN)  IV Stopped (03/31/21 1329)     LOS: 3 days    Time spent: 45  minutes    Ramiro Harvestaniel Cristal Howatt, MD [facility]   To contact the attending provider between 7A-7P or the covering provider during after hours 7P-7A, please log into the web site [email or web address] and access using universal [place] password for that web site. If you do not have the password, please call the hospital operator.  04/01/2021, 10:16 AM

## 2021-04-01 NOTE — Evaluation (Signed)
Speech Language Pathology Evaluation Patient Details Name: Becky Hendricks MRN: [id number] DOB: 1946-05-28 Today's Date: 04/01/2021 Time: 1425-1440 SLP Time Calculation (min) (ACUTE ONLY): 15 min  Problem List:  Patient Active Problem List   Diagnosis Date Noted   Acute respiratory failure with hypoxia (HCC) 03/29/2021   Hyponatremia 03/29/2021   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   Pneumonia of lower lobe due to infectious organism 03/29/2021   Colitis 03/29/2021   Acute respiratory failure (HCC) 03/29/2021   Bipolar affective disorder, currently manic, mild (HCC) 11/19/2020   Constipation 11/18/2020   Encephalopathy 11/18/2020   MRSA bacteremia    SIRS (systemic inflammatory response syndrome) (HCC) 11/17/2020   Nausea and vomiting 11/17/2020   Mixed diabetic hyperlipidemia associated with type 2 diabetes mellitus (HCC) 11/17/2020   Type 2 diabetes mellitus with hyperglycemia, with long-term current use of insulin (HCC) 11/17/2020   Immunization due 07/26/2020   Psychosis (HCC)    Bipolar disorder (HCC) 04/27/2020   Fall 02/11/2020   Chronic diastolic CHF (congestive heart failure) (HCC) 01/22/2020   Drug rash    Acute osteomyelitis of pelvic region and thigh (HCC)    Palliative care by specialist    Goals of care, counseling/discussion    Edema of upper extremity    Hyperphosphatemia    Cardiac arrest (HCC)    Sacral wound    Hypoxia    Severe sepsis (HCC) 12/03/2019   Abscess of multiple sites of buttock 12/03/2019   AF (paroxysmal atrial fibrillation) (HCC) 12/03/2019   Metabolic encephalopathy 09/15/2019   Urinary tract infection without hematuria 09/15/2019   Pressure injury of skin 09/10/2019   Morbid obesity with BMI of 40.0-44.9, adult (HCC) 02/27/2018   Past Medical History:  Past Medical History:  Diagnosis Date   Atrial fibrillation (HCC)    Bipolar disorder (HCC)    Bipolar disorder (HCC) 04/27/2020   Clostridium difficile diarrhea 08/02/2019    Dehydration    Essential hypertension    Fall 02/11/2020   Morbid obesity (HCC)    Osteomyelitis (HCC) 12/03/2019   Pneumonia due to COVID-19 virus 09/15/2019   Type 2 diabetes mellitus (HCC)    Weakness    Past Surgical History:  Past Surgical History:  Procedure Laterality Date   INCISION AND DRAINAGE PERIRECTAL ABSCESS Left 12/04/2019   Procedure: IRRIGATION AND DEBRIDEMENT LEFT BUTTOCK ABSCESS;  Surgeon: Violeta Gelinas, MD;  Location: [facility] OR;  Service: General;  Laterality: Left;   INCISION AND DRAINAGE PERIRECTAL ABSCESS Left 12/10/2019   Procedure: REPEAT IRRIGATION AND DEBRIDEMENT BUTTOCK  ABSCESS;  Surgeon: Abigail Miyamoto, MD;  Location: [facility] OR;  Service: General;  Laterality: Left;   IR FLUORO GUIDE CV LINE RIGHT  11/25/2020   IR RADIOLOGIST EVAL & MGMT  12/29/2020   IR US GUIDE VASC ACCESS RIGHT  11/25/2020   HPI:  75 y.o. female  admitted on 03/29/2021   75 year old female admitted from [place] former rehab facility with 2 weeks history of progressive generalized weakness and with acute loss of consciousness and respiratory distress.  Patient admitted to critical care service at [facility] in [facility] for acute hypercapnic respiratory failure and severe sepsis.  Patient has underlying history of paroxysmal atrial fibrillation, bipolar disorder, hypertension diabetes and seizure disorder.  She has history of cardiac arrest in May 2021.  She was admitted in April 2022 for MRSA bacteremia, has history of chronic osteomyelitis of coccyx area. Intubation 03/29/21-03/31/21   Assessment / Plan / Recommendation Clinical Impression  Becky Hendricks demonstrates mild anomia and  cognitive impairment, likely very mildly worsened from baseline. Per chart, she had similar presentation after extubation last hospitalization with intermittent semantic and phonemic paraphasias. Becky Hendricks became verbally agitated during this evaluation, stating, "I don't have any trouble when I'm not being asked  to do nonsense." She has been residing in SNF for rehab over past several months and has no responsibility for IADL tasks. She answered medication management questions with acc'y in 1/3. She could not answer functional money calculation questions and became agitated, stating SLP was trying to trick her. She was oriented x4, was able to recall 3/3 events of her day, and verbalized medical history well. Becky Hendricks had insight into paraphasias during evaluation and would say, "no, no, that's not what I mean." She was generally unable to correct herself despite knowing she had used the wrong word. Voice was mildly harsh, but fully intelligible and Becky Hendricks reported it sounded normal.   Given baseline deficits and total assist for cognitive tasks PTA, no further SLP is indicated at acute phase. She may benefit from SLP at next level of care for mild anomia and executive functioning. SLP is to follow x1 for dysphagia.    SLP Assessment  SLP Recommendation/Assessment: Patient needs continued Speech Lanaguage Pathology Services SLP Visit Diagnosis: Cognitive communication deficit (R41.841)    Follow Up Recommendations  None    Frequency and Duration min 1 x/week  1 week      SLP Evaluation Cognition  Overall Cognitive Status: History of cognitive impairments - at baseline Arousal/Alertness: Awake/alert Orientation Level: Oriented X4 Memory: Impaired Memory Impairment: Storage deficit;Retrieval deficit Awareness: Impaired Problem Solving: Impaired Executive Function: Self Correcting;Self Monitoring Self Monitoring: Impaired Self Correcting: Impaired Behaviors: Verbal agitation Safety/Judgment: Impaired       Comprehension  Auditory Comprehension Overall Auditory Comprehension: Appears within functional limits for tasks assessed    Expression Expression Primary Mode of Expression: Verbal Verbal Expression Overall Verbal Expression: Impaired Initiation: No impairment Repetition: Impaired Level of  Impairment: Sentence level Naming: Impairment Responsive: 76-100% accurate Divergent: 75-100% accurate Verbal Errors: Phonemic paraphasias Pragmatics: Impairment Impairments: Abnormal affect Interfering Components: Attention   Oral / Motor  Oral Motor/Sensory Function Overall Oral Motor/Sensory Function: Within functional limits Motor Speech Respiration: Within functional limits Phonation: Normal Resonance: Within functional limits Articulation: Within functional limitis Intelligibility: Intelligible                      [place] Silveria P. Monzerrath Mcburney, M.S., CCC-SLP Speech-Language Pathologist Acute Rehabilitation Services Pager: [phone]  Susanne Borders Damaree Sargent 04/01/2021, 3:12 PM

## 2021-04-01 NOTE — Evaluation (Signed)
Clinical/Bedside Swallow Evaluation Patient Details  Name: Becky Hendricks MRN: [id number] Date of Birth: [birth year]  Today's Date: 04/01/2021 Time: SLP Start Time (ACUTE ONLY): 1415 SLP Stop Time (ACUTE ONLY): 1425 SLP Time Calculation (min) (ACUTE ONLY): 10 min  Past Medical History:  Past Medical History:  Diagnosis Date   Atrial fibrillation (HCC)    Bipolar disorder (HCC)    Bipolar disorder (HCC) 04/27/2020   Clostridium difficile diarrhea 08/02/2019   Dehydration    Essential hypertension    Fall 02/11/2020   Morbid obesity (HCC)    Osteomyelitis (HCC) 12/03/2019   Pneumonia due to COVID-19 virus 09/15/2019   Type 2 diabetes mellitus (HCC)    Weakness    Past Surgical History:  Past Surgical History:  Procedure Laterality Date   INCISION AND DRAINAGE PERIRECTAL ABSCESS Left 12/04/2019   Procedure: IRRIGATION AND DEBRIDEMENT LEFT BUTTOCK ABSCESS;  Surgeon: Violeta Gelinas, MD;  Location: [facility] OR;  Service: General;  Laterality: Left;   INCISION AND DRAINAGE PERIRECTAL ABSCESS Left 12/10/2019   Procedure: REPEAT IRRIGATION AND DEBRIDEMENT BUTTOCK  ABSCESS;  Surgeon: Abigail Miyamoto, MD;  Location: [facility] OR;  Service: General;  Laterality: Left;   IR FLUORO GUIDE CV LINE RIGHT  11/25/2020   IR RADIOLOGIST EVAL & MGMT  12/29/2020   IR US GUIDE VASC ACCESS RIGHT  11/25/2020   HPI:  75 y.o. female  admitted on 03/29/2021   75 year old female admitted from [place] former rehab facility with 2 weeks history of progressive generalized weakness and with acute loss of consciousness and respiratory distress.  Patient admitted to critical care service at [facility] in [facility] for acute hypercapnic respiratory failure and severe sepsis.  Patient has underlying history of paroxysmal atrial fibrillation, bipolar disorder, hypertension diabetes and seizure disorder.  She has history of cardiac arrest in May 2021.  She was admitted in April 2022 for MRSA bacteremia, has history of  chronic osteomyelitis of coccyx area. Intubation 03/29/21-03/31/21   Assessment / Plan / Recommendation Clinical Impression  Ms. Kakos demonstrates oropharyngeal swallow function suspected to be WNL. She had recent brief intubation; extubated 03/31/21. Oral mech was WNL; voice was WNL. She had mild throat clear noted upon sequential sip challenge with thin liquids. No s/s of dysphagia or aspiration noted with single sips of liquids or with regular solids. D/t recent extubation, recommend dysphagia 3 solids and thin liquids in single sips via cup.   SLP service to follow up for upgrade to regular solids and liberate restrictions (use of straw) as indicated. SLP Visit Diagnosis: Dysphagia, unspecified (R13.10)    Aspiration Risk  Mild aspiration risk    Diet Recommendation Thin liquid;Dysphagia 3 (Mech soft)   Liquid Administration via: Cup Medication Administration: Crushed with puree Supervision: Staff to assist with self feeding Compensations: Slow rate;Small sips/bites;Follow solids with liquid Postural Changes: Seated upright at 90 degrees    Other  Recommendations Oral Care Recommendations: Oral care BID   Follow up Recommendations None      Frequency and Duration min 1 x/week  1 week       Prognosis Prognosis for Safe Diet Advancement: Good      Swallow Study   General Date of Onset: 03/31/21 HPI: 75 y.o. female  admitted on 03/29/2021   75 year old female admitted from [place] former rehab facility with 2 weeks history of progressive generalized weakness and with acute loss of consciousness and respiratory distress.  Patient admitted to critical care service at [facility] in [facility]  for acute hypercapnic respiratory failure and severe sepsis.  Patient has underlying history of paroxysmal atrial fibrillation, bipolar disorder, hypertension diabetes and seizure disorder.  She has history of cardiac arrest in May 2021.  She was admitted in April 2022  for MRSA bacteremia, has history of chronic osteomyelitis of coccyx area. Intubation 03/29/21-03/31/21 Type of Study: Bedside Swallow Evaluation Previous Swallow Assessment: april 2022; WNL Diet Prior to this Study: Thin liquids Temperature Spikes Noted: No History of Recent Intubation: Yes Length of Intubations (days): 2 days Date extubated: 03/31/21 Behavior/Cognition: Alert;Requires cueing;Agitated Oral Cavity Assessment: Within Functional Limits Oral Care Completed by SLP: Recent completion by staff Oral Cavity - Dentition: Adequate natural dentition Self-Feeding Abilities: Able to feed self Patient Positioning: Upright in bed Baseline Vocal Quality: Normal Volitional Cough: Strong Volitional Swallow: Able to elicit    Oral/Motor/Sensory Function Overall Oral Motor/Sensory Function: Within functional limits   Ice Chips   Deferred  Thin Liquid Thin Liquid: Within functional limits Presentation: Straw;Cup Other Comments: mild throat clear noted when pt took sequential straw sips    Puree Puree: Within functional limits Presentation: Self Fed   Solid     Solid: Within functional limits Presentation: Self Fed     Natash Berman P. Arah Aro, M.S., CCC-SLP Speech-Language Pathologist Acute Rehabilitation Services Pager: [phone]  Susanne Borders Jaydrian Corpening 04/01/2021,3:02 PM

## 2021-04-02 ENCOUNTER — Inpatient Hospital Stay (HOSPITAL_COMMUNITY): Payer: Medicare Other

## 2021-04-02 DIAGNOSIS — R4182 Altered mental status, unspecified: Secondary | ICD-10-CM

## 2021-04-02 DIAGNOSIS — G9341 Metabolic encephalopathy: Secondary | ICD-10-CM | POA: Diagnosis not present

## 2021-04-02 DIAGNOSIS — R6520 Severe sepsis without septic shock: Secondary | ICD-10-CM

## 2021-04-02 DIAGNOSIS — A419 Sepsis, unspecified organism: Secondary | ICD-10-CM

## 2021-04-02 DIAGNOSIS — J9601 Acute respiratory failure with hypoxia: Secondary | ICD-10-CM | POA: Diagnosis not present

## 2021-04-02 DIAGNOSIS — I480 Paroxysmal atrial fibrillation: Secondary | ICD-10-CM | POA: Diagnosis not present

## 2021-04-02 LAB — GLUCOSE, CAPILLARY
Glucose-Capillary: 117 mg/dL — ABNORMAL HIGH (ref 70–99)
Glucose-Capillary: 103 mg/dL — ABNORMAL HIGH (ref 70–99)
Glucose-Capillary: 101 mg/dL — ABNORMAL HIGH (ref 70–99)
Glucose-Capillary: 102 mg/dL — ABNORMAL HIGH (ref 70–99)
Glucose-Capillary: 146 mg/dL — ABNORMAL HIGH (ref 70–99)

## 2021-04-02 LAB — CBC
HCT: 35.9 % — ABNORMAL LOW (ref 36.0–46.0)
Hemoglobin: 10.6 g/dL — ABNORMAL LOW (ref 12.0–15.0)
MCH: 27.8 pg (ref 26.0–34.0)
MCHC: 29.5 g/dL — ABNORMAL LOW (ref 30.0–36.0)
MCV: 94.2 fL (ref 80.0–100.0)
Platelets: 191 10*3/uL (ref 150–400)
RBC: 3.81 MIL/uL — ABNORMAL LOW (ref 3.87–5.11)
RDW: 15.8 % — ABNORMAL HIGH (ref 11.5–15.5)
WBC: 10.7 10*3/uL — ABNORMAL HIGH (ref 4.0–10.5)
nRBC: 0 % (ref 0.0–0.2)

## 2021-04-02 LAB — BLOOD GAS, ARTERIAL
Acid-Base Excess: 4.4 mmol/L — ABNORMAL HIGH (ref 0.0–2.0)
Acid-Base Excess: 6.2 mmol/L — ABNORMAL HIGH (ref 0.0–2.0)
Bicarbonate: 32.1 mmol/L — ABNORMAL HIGH (ref 20.0–28.0)
Bicarbonate: 32.8 mmol/L — ABNORMAL HIGH (ref 20.0–28.0)
FIO2: 32
FIO2: 35
O2 Saturation: 92.8 %
O2 Saturation: 95.7 %
Patient temperature: 98.6
Patient temperature: 37
pCO2 arterial: 69.1 mmHg (ref 32.0–48.0)
pCO2 arterial: 62.1 mmHg — ABNORMAL HIGH (ref 32.0–48.0)
pH, Arterial: 7.289 — ABNORMAL LOW (ref 7.350–7.450)
pH, Arterial: 7.343 — ABNORMAL LOW (ref 7.350–7.450)
pO2, Arterial: 69.8 mmHg — ABNORMAL LOW (ref 83.0–108.0)
pO2, Arterial: 79.1 mmHg — ABNORMAL LOW (ref 83.0–108.0)

## 2021-04-02 LAB — BASIC METABOLIC PANEL
Anion gap: 7 (ref 5–15)
BUN: 11 mg/dL (ref 8–23)
CO2: 32 mmol/L (ref 22–32)
Calcium: 8.2 mg/dL — ABNORMAL LOW (ref 8.9–10.3)
Chloride: 96 mmol/L — ABNORMAL LOW (ref 98–111)
Creatinine, Ser: 0.5 mg/dL (ref 0.44–1.00)
GFR, Estimated: >60 mL/min (ref >60)
Glucose, Bld: 117 mg/dL — ABNORMAL HIGH (ref 70–99)
Potassium: 3.9 mmol/L (ref 3.5–5.1)
Sodium: 135 mmol/L (ref 135–145)

## 2021-04-02 LAB — AMMONIA: Ammonia: 28 umol/L (ref 9–35)

## 2021-04-02 IMAGING — MR MR HEAD W/O CM
9 of 10 series · 39 of 48 positions shown · non-contrast
Comparison: [DATE]

CLINICAL DATA: Mental status change, unknown cause

EXAM:
MRI HEAD WITHOUT CONTRAST
TECHNIQUE: Multiplanar, multiecho pulse sequences of the brain and surrounding
structures were obtained without intravenous contrast.

[Series 7: T2 · sagittal · 5.0mm · 0.47mm/px · 3 of 24 slices shown (1 of 3)]
[im 1/24]
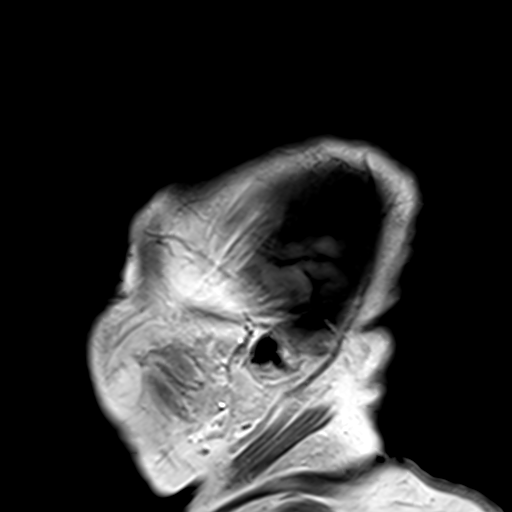
[im 12/24]
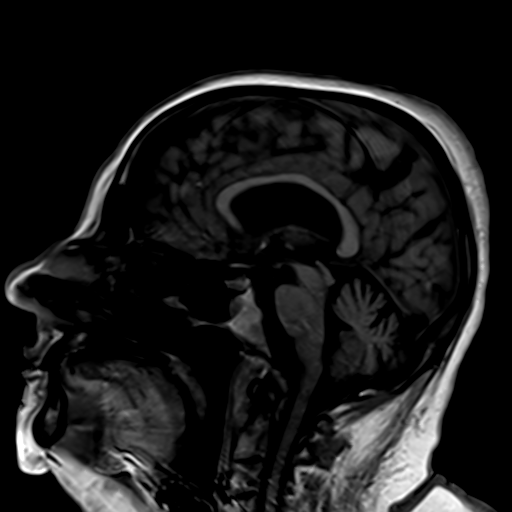
[im 24/24]
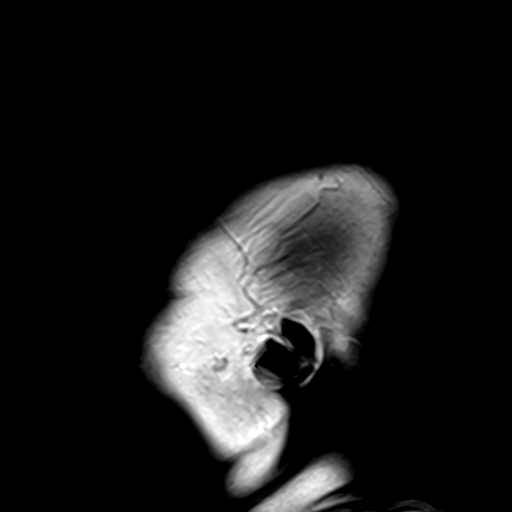

[Series 8: dwi_tracew · axial · 3.0mm · 1.08mm/px · z∈[-36,+109]mm · 8 of 102 slices shown]
[im 1/102]
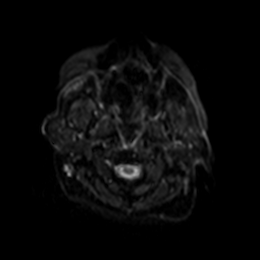
[im 19/102]
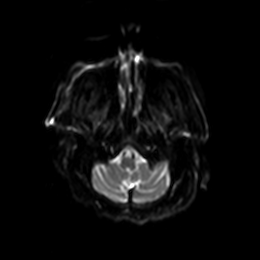
[im 28/102]
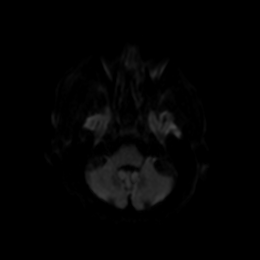
[im 46/102]
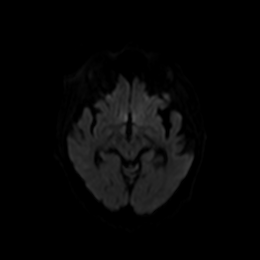
[im 56/102]
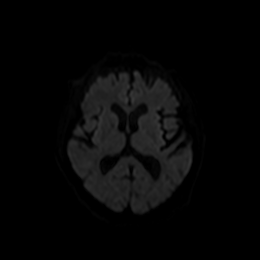
[im 74/102]
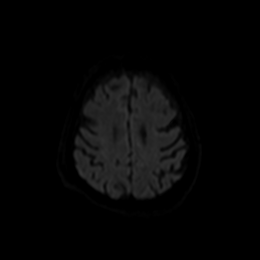
[im 83/102]
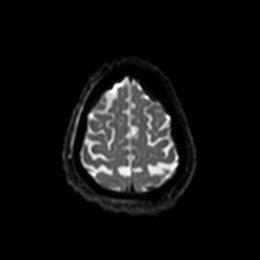
[im 102/102]
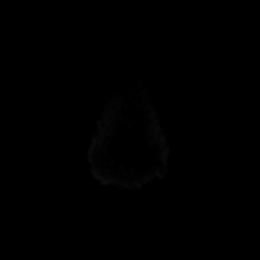

[Series 10: T2 · axial · 5.0mm · 0.45mm/px · z∈[-69,+113]mm · 3 of 30 slices shown (2 of 3)]
[im 1/30]
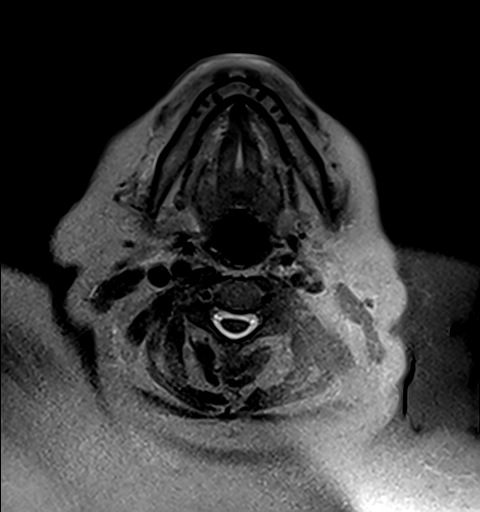
[im 15/30]
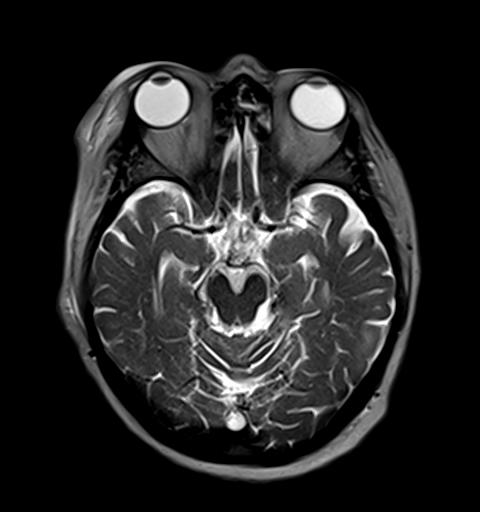
[im 30/30]
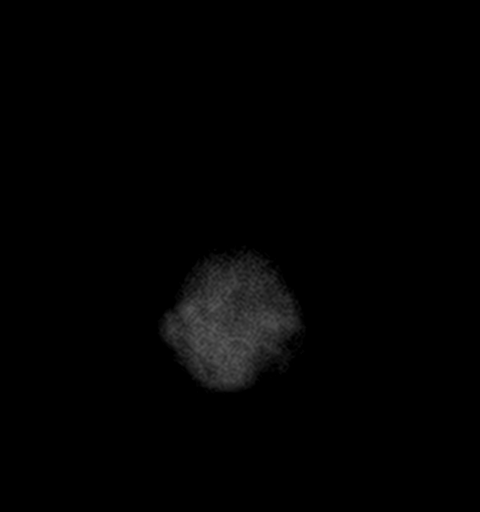

[Series 11: GRE · axial · 3.0mm · 0.45mm/px · z∈[-35,+110]mm · 5 of 51 slices shown]
[im 1/51]
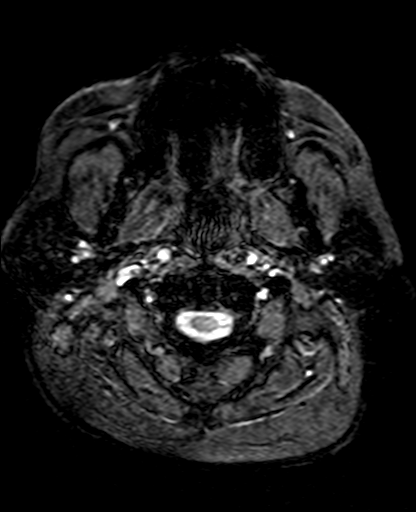
[im 13/51]
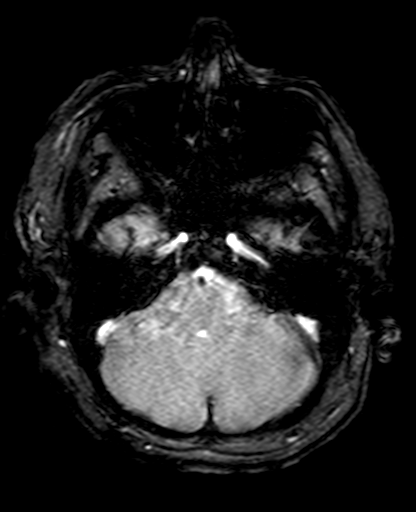
[im 26/51]
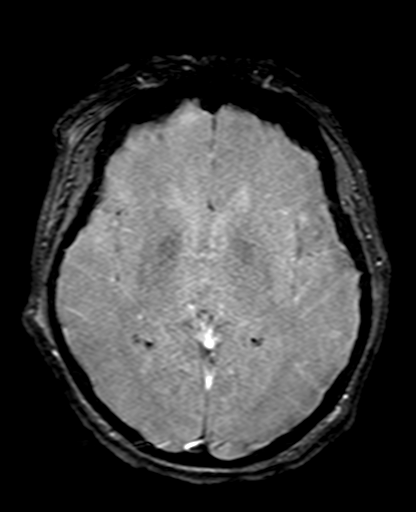
[im 38/51]
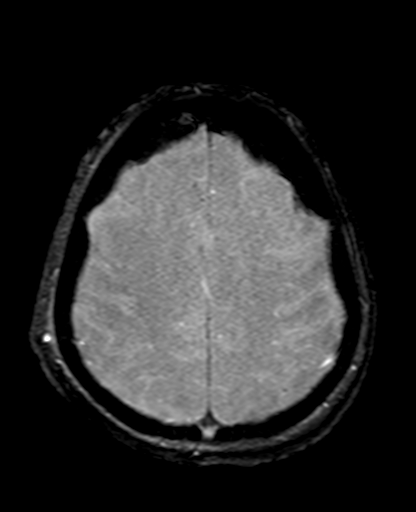
[im 51/51]
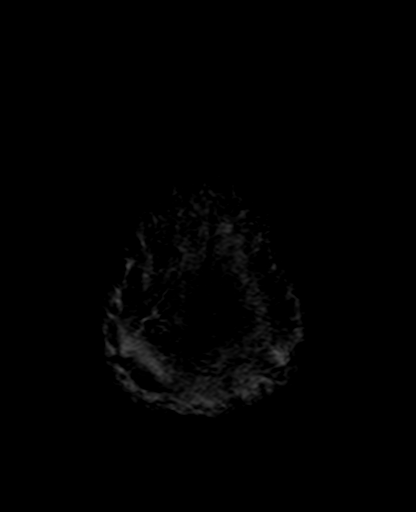

[Series 12: FLAIR · axial · 3.0mm · 0.86mm/px · z∈[-49,+96]mm · 5 of 51 slices shown]
[im 1/51]
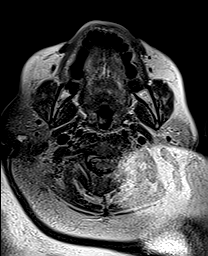
[im 13/51]
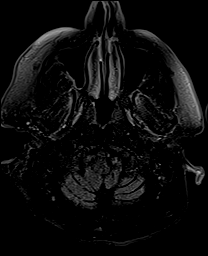
[im 26/51]
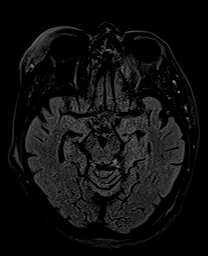
[im 38/51]
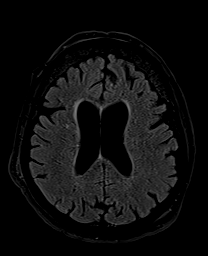
[im 51/51]
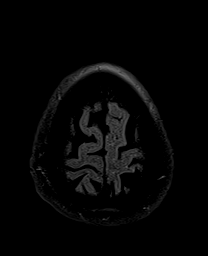

[Series 13: T1 · axial · 3.0mm · 0.45mm/px · z∈[-35,+110]mm · 5 of 51 slices shown]
[im 1/51]
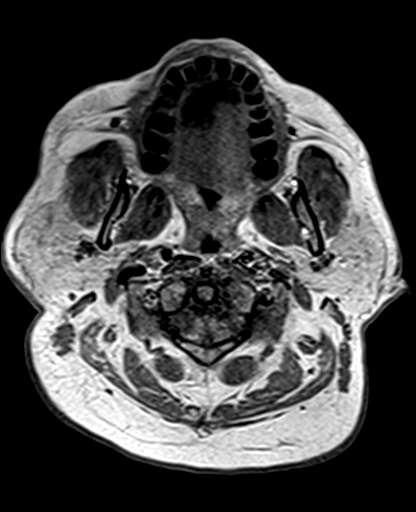
[im 13/51]
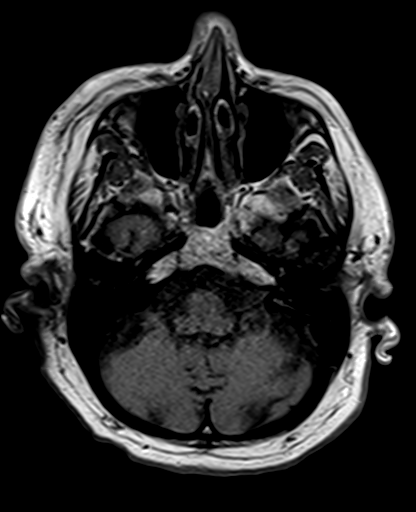
[im 26/51]
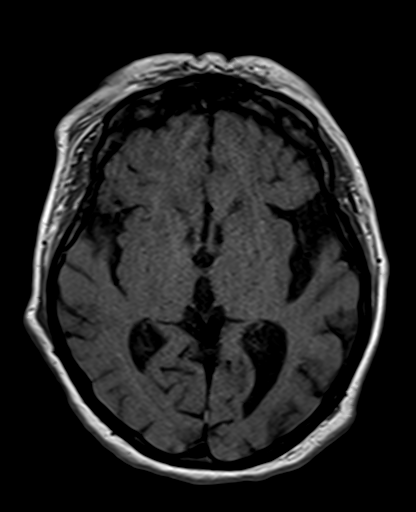
[im 38/51]
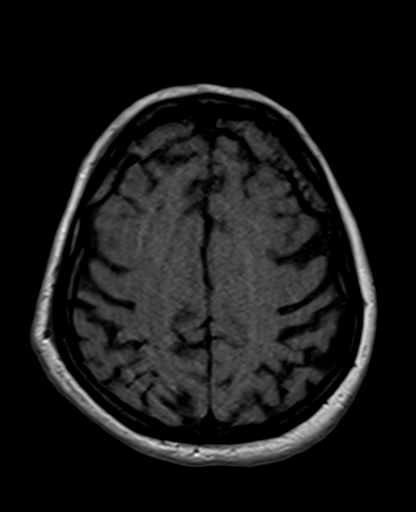
[im 51/51]
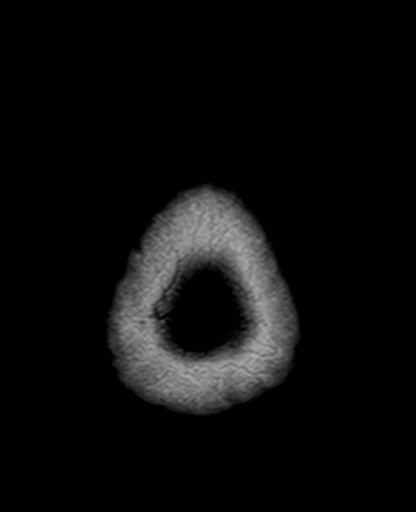

[Series 14: DWI · coronal · 5.0mm · 1.31mm/px · 5 of 52 slices shown (1 of 2)]
[im 1/52]
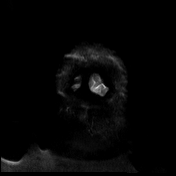
[im 13/52]
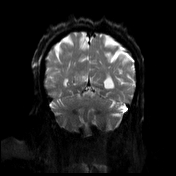
[im 26/52]
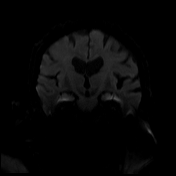
[im 39/52]
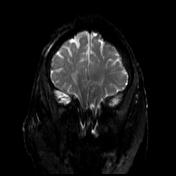
[im 52/52]
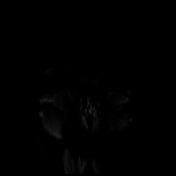

[Series 15: DWI · coronal · 5.0mm · 1.31mm/px · 3 of 26 slices shown (2 of 2)]
[im 1/26]
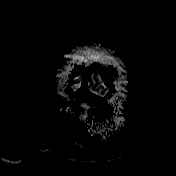
[im 13/26]
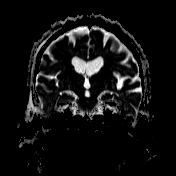
[im 26/26]
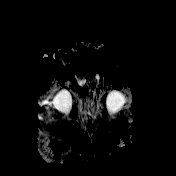

[Series 16: T2 · coronal · 5.0mm · 0.86mm/px · 2 of 23 slices shown (3 of 3)]
[im 1/23]
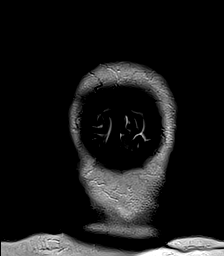
[im 23/23]
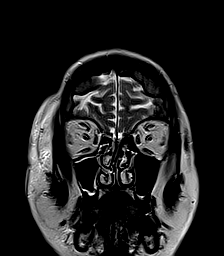

[39 of 48 positions shown; findings below may reference images not displayed]

FINDINGS: Brain: There is no acute infarction or intracranial hemorrhage.
There is no intracranial mass, mass effect, or edema. There is no
hydrocephalus or extra-axial fluid collection. Prominence of the
ventricles and sulci reflects generalized parenchymal volume loss.
Patchy T2 hyperintensity in the supratentorial white matter is
nonspecific but may reflect minor chronic microvascular ischemic
changes. Signal abnormality on the prior study has resolved.

Vascular: Major vessel flow voids at the skull base are preserved.

Skull and upper cervical spine: Normal marrow signal is preserved.

Sinuses/Orbits: Mild mucosal thickening.  Orbits are unremarkable.

Other: Sella is unremarkable. Mild patchy mastoid fluid
opacification.
IMPRESSION: No acute infarction, hemorrhage, or mass.

## 2021-04-02 IMAGING — CT CT HEAD W/O CM
4 of 5 series · 16 of 47 positions shown, 18 images · non-contrast
Comparison: [DATE]

CLINICAL DATA: TIA. Increased altered mental status and asymmetric
pupils.

EXAM:
CT HEAD WITHOUT CONTRAST
TECHNIQUE: Contiguous axial images were obtained from the base of the skull
through the vertex without intravenous contrast.

[Series 2: head wo · axial · 0.47mm/px · z∈[-593,-563]mm · 2 of 32 slices shown]
[im 7/32  brain]
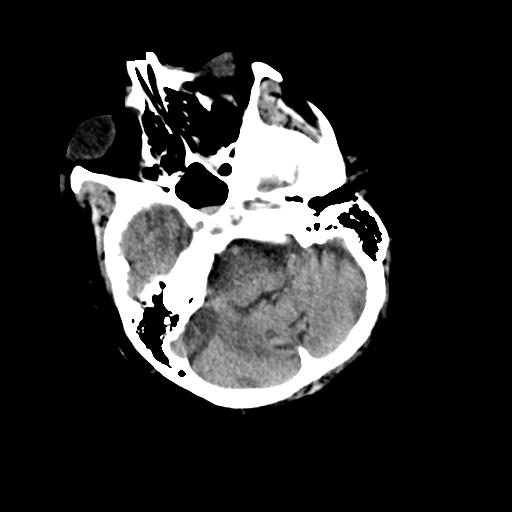
[im 13/32  brain]
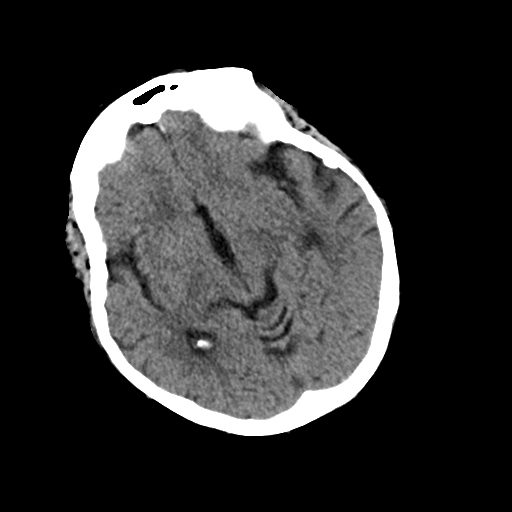

[Series 4: coronal soft tissue · coronal · 0.32mm/px · 3 of 67 slices shown]
[im 23/67  brain]
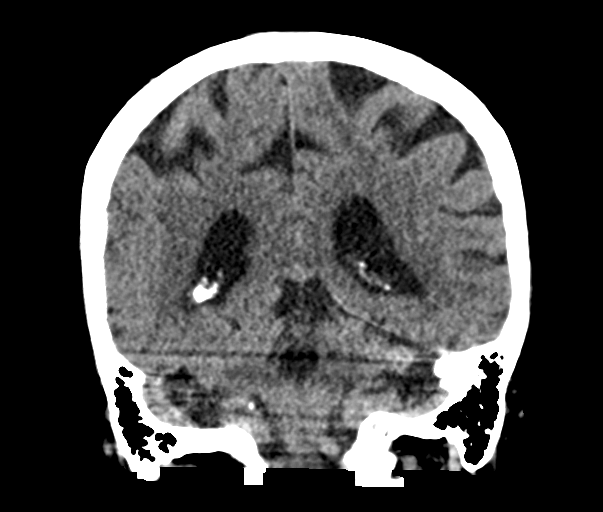
[im 30/67  brain]
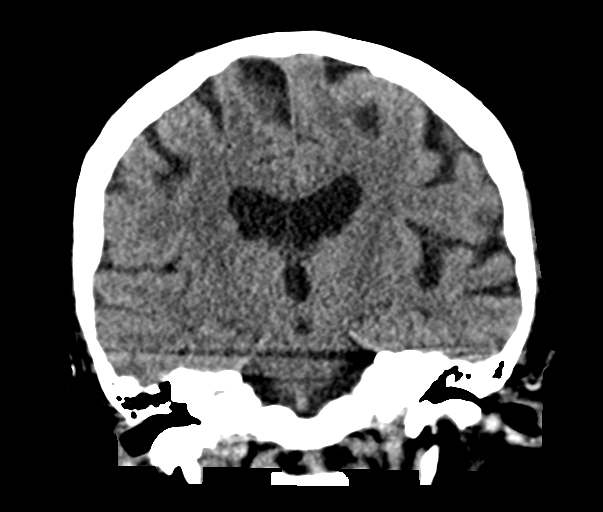
[im 37/67  brain]
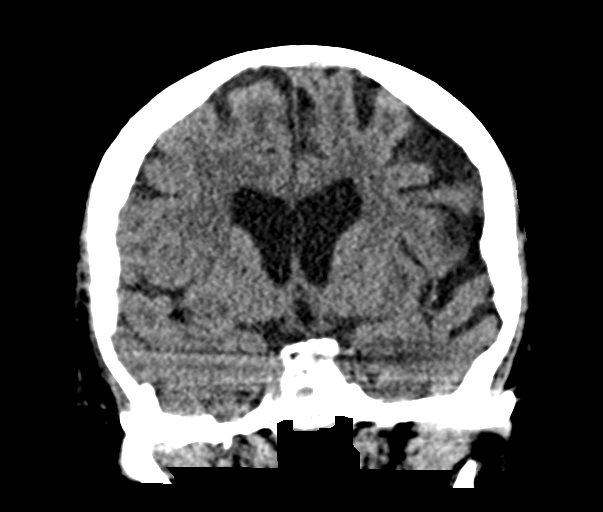

[Series 5: sagittal soft tissue · sagittal · 0.32mm/px · 3 of 65 slices shown]
[im 26/65  brain]
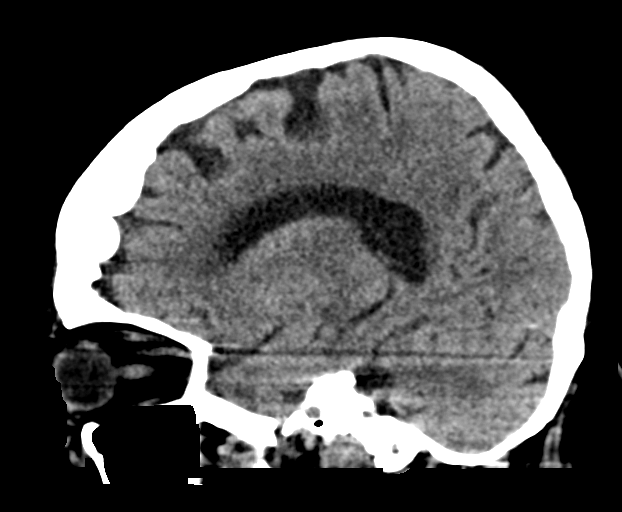
[im 33/65  brain]
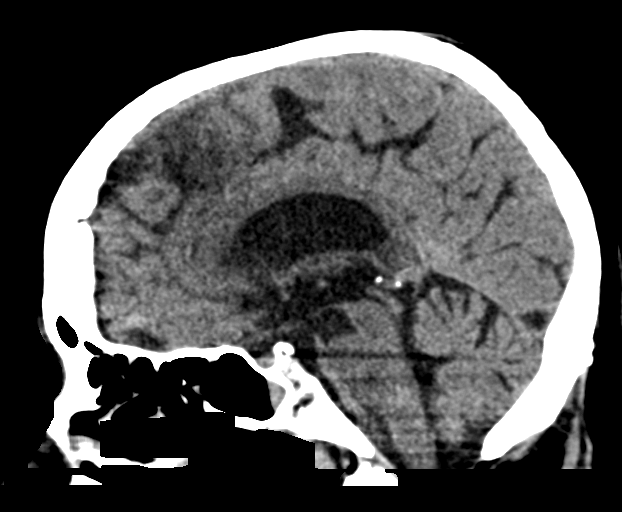
[im 39/65  brain]
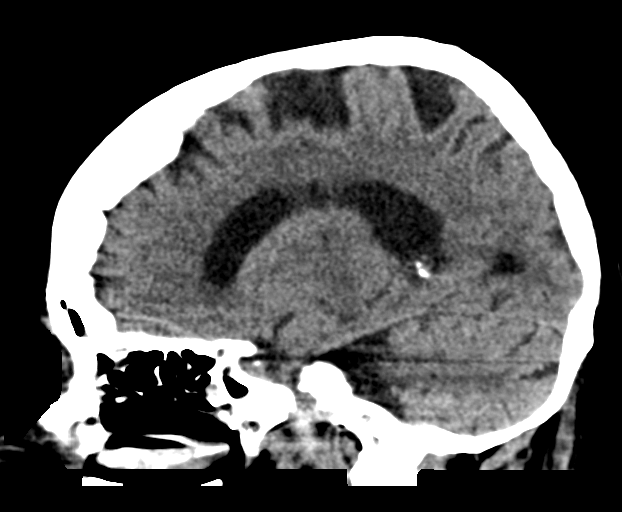

[Series 6: axial soft tissue · axial · 0.38mm/px · z∈[-631,-513]mm · 8 of 53 slices shown, 10 images]
[im 6/53  brain]
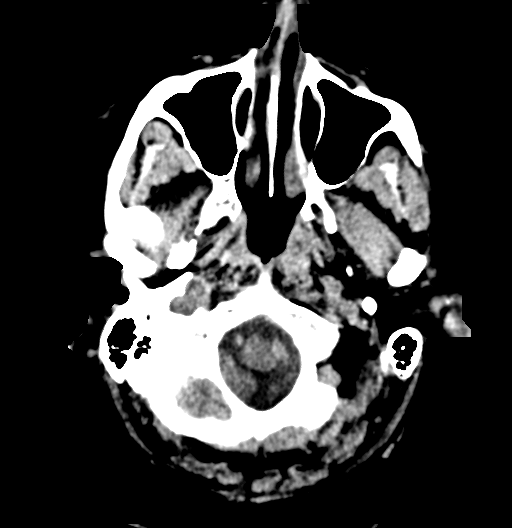
[im 6/53  bone]
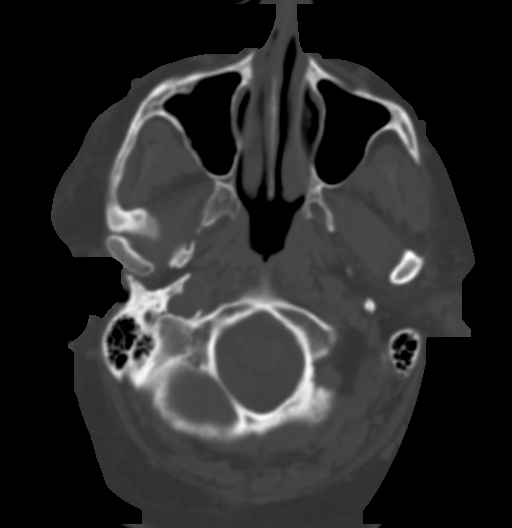
[im 12/53  brain]
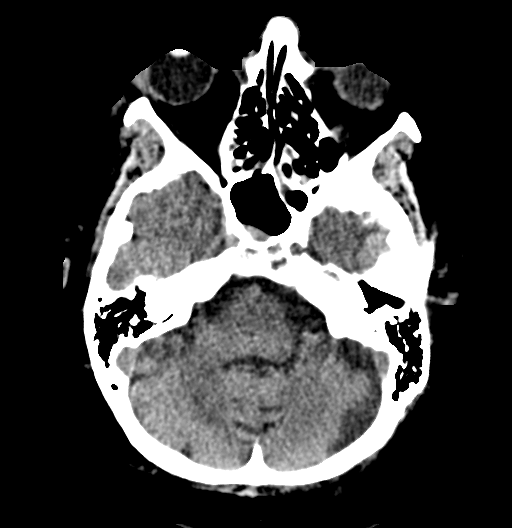
[im 18/53  brain]
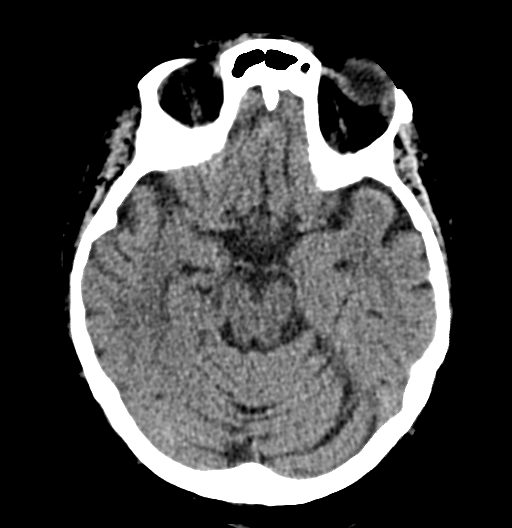
[im 24/53  brain]
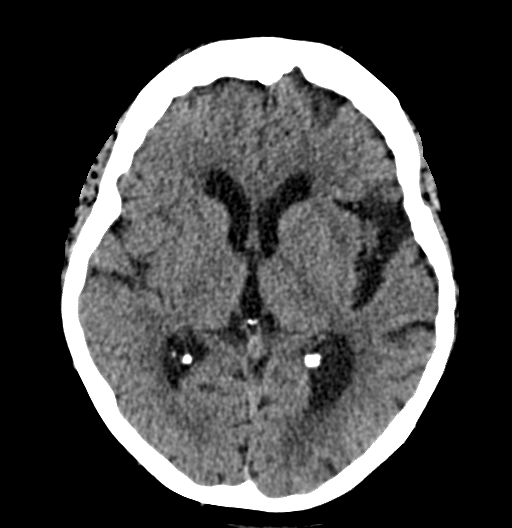
[im 29/53  brain]
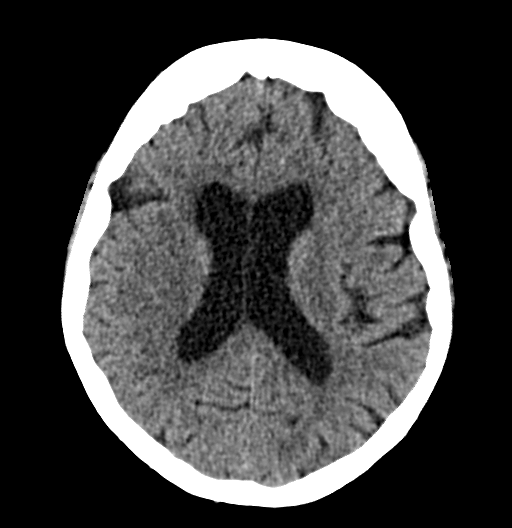
[im 29/53  bone]
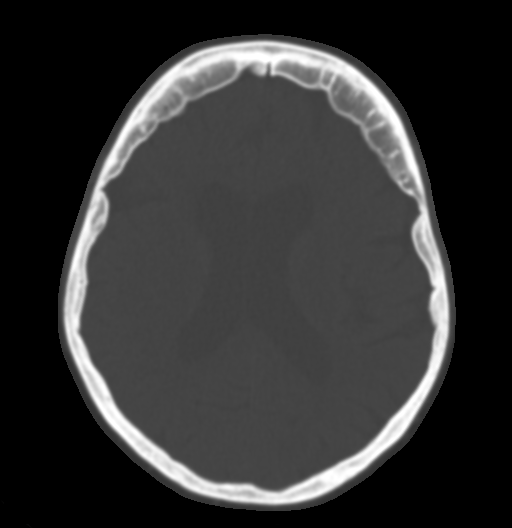
[im 35/53  brain]
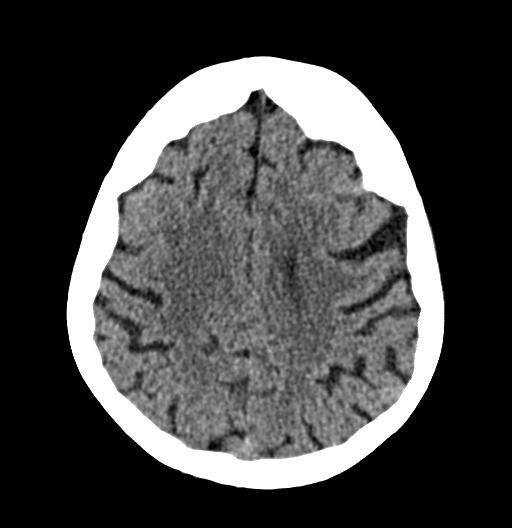
[im 41/53  brain]
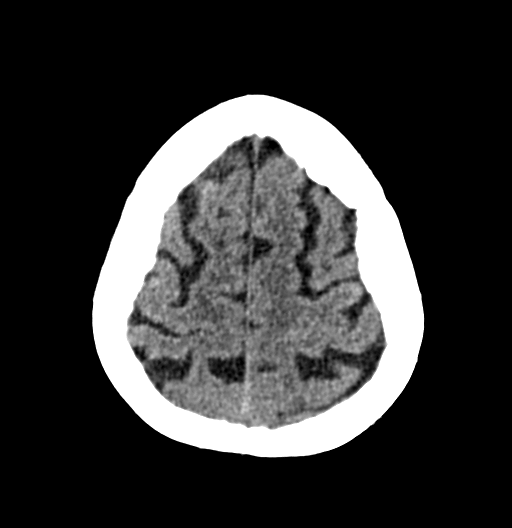
[im 47/53  brain]
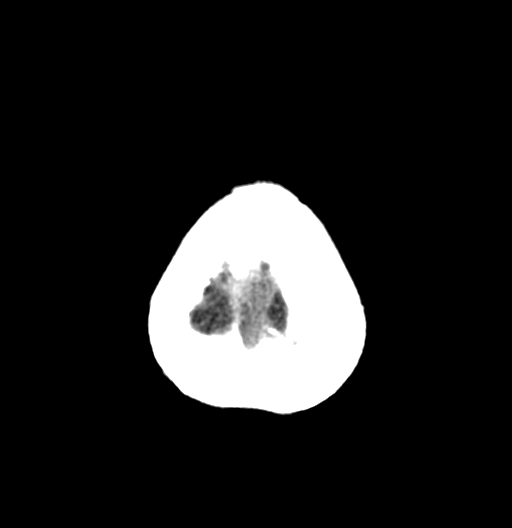

[16 of 47 positions shown; findings below may reference images not displayed]

FINDINGS: Brain: No evidence of acute infarction, hemorrhage, hydrocephalus,
extra-axial collection or mass lesion/mass effect. Generalized
atrophy.

Vascular: No hyperdense vessel or unexpected calcification.

Skull: Normal. Negative for fracture or focal lesion.

Sinuses/Orbits: Patchy opacification of paranasal sinuses, overall
mild.
IMPRESSION: No acute or interval finding.

## 2021-04-02 MED ORDER — METOPROLOL TARTRATE 5 MG/5ML IV SOLN
2.5000 mg | Freq: Three times a day (TID) | INTRAVENOUS | Status: DC
  Administered 2021-04-02: 2.5 mg via INTRAVENOUS
  Filled 2021-04-02: qty 5

## 2021-04-02 MED ORDER — CEFAZOLIN SODIUM-DEXTROSE 1-4 GM/50ML-% IV SOLN
1.0000 g | Freq: Three times a day (TID) | INTRAVENOUS | Status: AC
  Administered 2021-04-02 – 2021-04-05 (×11): 1 g via INTRAVENOUS
  Filled 2021-04-02 (×13): qty 50

## 2021-04-02 MED ORDER — LEVALBUTEROL HCL 0.63 MG/3ML IN NEBU
0.6300 mg | INHALATION_SOLUTION | Freq: Four times a day (QID) | RESPIRATORY_TRACT | Status: DC
  Administered 2021-04-02 – 2021-04-04 (×7): 0.63 mg via RESPIRATORY_TRACT
  Filled 2021-04-02 (×9): qty 3

## 2021-04-02 MED ORDER — METOPROLOL TARTRATE 5 MG/5ML IV SOLN
5.0000 mg | Freq: Three times a day (TID) | INTRAVENOUS | Status: DC
  Administered 2021-04-02: 5 mg via INTRAVENOUS
  Filled 2021-04-02: qty 5

## 2021-04-02 MED ORDER — BUDESONIDE 0.5 MG/2ML IN SUSP
0.5000 mg | Freq: Two times a day (BID) | RESPIRATORY_TRACT | Status: DC
  Administered 2021-04-02 – 2021-04-04 (×5): 0.5 mg via RESPIRATORY_TRACT
  Filled 2021-04-02 (×6): qty 2

## 2021-04-02 MED ORDER — METHYLPREDNISOLONE SODIUM SUCC 125 MG IJ SOLR
125.0000 mg | Freq: Three times a day (TID) | INTRAMUSCULAR | Status: DC
  Administered 2021-04-02 – 2021-04-03 (×3): 125 mg via INTRAVENOUS
  Filled 2021-04-02 (×3): qty 2

## 2021-04-02 MED ORDER — PANTOPRAZOLE SODIUM 40 MG IV SOLR
40.0000 mg | Freq: Every day | INTRAVENOUS | Status: DC
  Administered 2021-04-02 – 2021-04-03 (×2): 40 mg via INTRAVENOUS
  Filled 2021-04-02 (×2): qty 40

## 2021-04-02 MED ORDER — IPRATROPIUM BROMIDE 0.02 % IN SOLN
0.5000 mg | Freq: Four times a day (QID) | RESPIRATORY_TRACT | Status: DC
  Administered 2021-04-02 – 2021-04-04 (×8): 0.5 mg via RESPIRATORY_TRACT
  Filled 2021-04-02 (×9): qty 2.5

## 2021-04-02 MED ORDER — VALPROATE SODIUM 100 MG/ML IV SOLN
500.0000 mg | Freq: Two times a day (BID) | INTRAVENOUS | Status: DC
  Administered 2021-04-02 – 2021-04-03 (×3): 500 mg via INTRAVENOUS
  Filled 2021-04-02 (×3): qty 5

## 2021-04-02 NOTE — Progress Notes (Addendum)
PROGRESS NOTE    Becky Hendricks  OJJ:[id number] DOB: 03-23-1946 DOA: 03/29/2021 PCP: Roderic Scarce, MD    Chief Complaint  Patient presents with   Fatigue   Altered Mental Status   Hypoxia    Brief Narrative:  75y/o female that had a 2 week hx of progressive generalized weakness presented to the ER 8/17 for [employer] [facility] [employer] and resp distress.  The pt presented from [facility].  Once in the ER the pt was found to have a ph of 7.084 and required emergent intubation. The pt had a similar presentation in April of this year and was found to have MRSA bacteriemia at that time.  Concern for PNA, UTI on admit as source of sepsis.    Assessment & Plan:   Principal Problem:   Severe sepsis (HCC) Active Problems:   Metabolic encephalopathy   Urinary tract infection without hematuria   AF (paroxysmal atrial fibrillation) (HCC)   Sacral wound   Bipolar disorder (HCC)   Type 2 diabetes mellitus with hyperglycemia, with long-term current use of insulin (HCC)   Encephalopathy   Acute respiratory failure with hypoxia (HCC)   Hyponatremia   Functional quadriplegia (HCC)   Pneumonia of lower lobe due to infectious organism   Colitis   Acute respiratory failure (HCC)   1 acute hypoxic hypercarbic respiratory failure/pneumonia, POA -Patient was intubated on admission and placed empirically on IV antibiotics. -Patient extubated currently on 3 L nasal cannula. -Patient lethargic this morning and not following commands. -CT head pending. -ABG with a respiratory acidosis. -IV antibiotics narrowed down to Rocephin which we will continue. -Placed on BiPAP. -Placed on IV Solu-Medrol, Pulmicort, Xopenex and Atrovent nebs. -Discontinue duo nebs. -PCCM to reassess. -Follow.  2.  Severe sepsis with septic shock secondary to pneumonia/Proteus Mirabilis UTI, POA -Currently afebrile.  Patient initially improving. -Patient lethargic this morning. -Currently not on pressors. -Urine cultures with >  100,000 colonies of Proteus Mirabilis -Blood cultures pending with no growth to date. -Fever curve trending down. -Urine Legionella antigen negative, urine pneumococcus antigen negative. -IV antibiotics narrowed down to IV Rocephin which we will continue. -Follow-up.  3.  Acute metabolic encephalopathy/seizure disorder/bipolar disorder -Acute metabolic encephalopathy initially felt likely secondary to sepsis infection. -Patient with no noted seizures early on during the hospitalization however with current level of lethargy concern for possible postictal state.. -Patient was improving clinically initially however this morning patient very lethargic not following commands. -Head CT ordered and pending. -Check ammonia level. -ABG obtained with a respiratory acidosis. -Place patient on Solu-Medrol 125 mg IV every 8 hours, Pulmicort, Xopenex and Atrovent nebs, change oral PPI to IV PPI.  Continue Flonase. -Placed on BiPAP. -Check a EEG. -Continue empiric IV antibiotics. -Change oral Depakote to IV Depakote. -1 more alert continue risperidone. -Follow.  4.  Paroxysmal A. fib/bradycardia -Patient noted to have briefly gone into a bout of A. fib yesterday evening given a dose of IV Lopressor and started back on home dose oral Coreg with rate control. -Patient back in normal sinus rhythm -Patient lethargic, unable to take oral medications at this time and as such we will discontinue Coreg and placed on scheduled IV Lopressor for rate control. -Eliquis for anticoagulation.  5.  Diabetes mellitus type 2 -Hemoglobin A1c 7.0  -CBG 117 this morning.   -Sliding scale insulin.   -Patient to be started on IV Solu-Medrol and as such monitor CBGs.   6.  Deconditioned -PT/OT. -We will likely need SNF placement.  7.  Constipation -MiraLAX  twice daily, Senokot-S daily.         DVT prophylaxis: Eliquis Code Status: Full Family Communication: Updated patient.  No family at  bedside. Disposition:   Status is: Inpatient  Remains inpatient appropriate because:Inpatient level of care appropriate due to severity of illness  Dispo: The patient is from: SNF              Anticipated d/c is to: SNF              Patient currently is not medically stable to d/c.   Difficult to place patient No       Consultants:  None.  Procedures:  Chest x-ray 04/01/2021, 03/30/2021, 03/29/2021 CT abdomen and pelvis 03/29/2021 CT head 03/29/2021 Abdominal films 03/29/2021 CT head pending 04/02/2021  Significant Hospital Events: Including procedures, antibiotic start and stop dates in addition to other pertinent events   8/17 Admit with ALOC, weakness, respiratory distress.  CT head negative for acute intracranial process, possible sinusitis, CT abd/pelvis with mild bilateral posterior subsegmental atelectasis + pleural effusions, large amt of stool in distal sigmoid colon 8/19 Required reintubation as pt was able to bite pilot balloon in half. Extubated later in am.     Antimicrobials:  IV azithromycin 8/18/ 2022x1 IV Rocephin 03/30/2021>>>>>   Subjective: Was called by nursing that patient more lethargic this morning not conversation like she did yesterday and not following commands.  Per RN patient with left pupil pinpoint.   Patient returning back from head CT.  Patient lethargic, opens eyes briefly to noxious stimuli and drifts back off.  Not following commands.  Patient noted to have gone into A. fib yesterday evening placed back on home dose oral Coreg and converted back to normal sinus rhythm this morning.  Objective: Vitals:   04/02/21 0500 04/02/21 0530 04/02/21 0600 04/02/21 0603  BP: (!) 154/61   (!) 149/58  Pulse: 90  94 94  Resp: (!) 29  (!) 27 (!) 25  Temp:  99.5 F (37.5 C)    TempSrc:  Oral    SpO2: 99%  97% 100%  Weight: 107.1 kg     Height:        Intake/Output Summary (Last 24 hours) at 04/02/2021 0955 Last data filed at 04/02/2021 0650 Gross per  24 hour  Intake 286.11 ml  Output 875 ml  Net -588.89 ml    Filed Weights   03/31/21 0500 04/01/21 0335 04/02/21 0500  Weight: 108.3 kg 107.7 kg 107.1 kg    Examination:  General exam: Lethargic. Respiratory system: Clear to auscultation anterior lung fields.  Decreased breath sounds in the bases.  No rhonchi.  Cardiovascular system: Regular rate and rhythm no murmurs rubs or gallops.  No JVD.  1-2+ bilateral lower extremity edema. Gastrointestinal system: Abdomen is nondistended, soft and nontender. No organomegaly or masses felt. Normal bowel sounds heard. Central nervous system: Lethargic.  Opens eyes to noxious stimuli but drifts back off to sleep.  Not following commands. Extremities: Symmetric 5 x 5 power. Skin: No rashes, lesions or ulcers Psychiatry: Judgement and insight unable to assess due to mental status.  Mood & affect unable to assess.     Data Reviewed: I have personally reviewed following labs and imaging studies  CBC: Recent Labs  Lab 03/29/21 1614 03/30/21 0311 04/01/21 0242 04/02/21 0304  WBC 12.7* 8.3 10.3 10.7*  NEUTROABS 7.7 6.2  --   --   HGB 12.2 10.8* 11.6* 10.6*  HCT 39.1 34.6* 38.9 35.9*  MCV 91.8  91.3 93.5 94.2  PLT 193 169 192 191     Basic Metabolic Panel: Recent Labs  Lab 03/29/21 1614 03/29/21 2130 03/30/21 0311 03/31/21 0308 04/01/21 0242 04/02/21 0304  NA 132*  --  131*  --  138 135  K 4.9  --  4.9  --  3.8 3.9  CL 87*  --  91*  --  101 96*  CO2 36*  --  30  --  29 32  GLUCOSE 121*  --  129*  --  115* 117*  BUN 16  --  22  --  14 11  CREATININE 0.55  --  0.53 0.69 0.47 0.50  CALCIUM 8.6*  --  8.3*  --  8.2* 8.2*  MG  --  2.0 1.8  --  1.9  --   PHOS  --  6.3* 4.3  --   --   --      GFR: Estimated Creatinine Clearance: 77.5 mL/min (by C-G formula based on SCr of 0.5 mg/dL).  Liver Function Tests: Recent Labs  Lab 03/29/21 1614 03/30/21 0311  AST 9* 12*  ALT 9 9  ALKPHOS 39 35*  BILITOT 0.4 0.4  PROT 6.3* 5.4*   ALBUMIN 3.1* 2.6*     CBG: Recent Labs  Lab 04/01/21 1153 04/01/21 1314 04/01/21 1710 04/02/21 0759 04/02/21 0945  GLUCAP 146* 139* 111* 117* 103*      Recent Results (from the past 240 hour(s))  Resp Panel by RT-PCR (Flu A&B, Covid) Nasopharyngeal Swab     Status: None   Collection Time: 03/29/21  4:12 PM   Specimen: Nasopharyngeal Swab; Nasopharyngeal(NP) swabs in vial transport medium  Result Value Ref Range Status   SARS Coronavirus 2 by RT PCR NEGATIVE NEGATIVE Final    Comment: (NOTE) SARS-CoV-2 target nucleic acids are NOT DETECTED.  The SARS-CoV-2 RNA is generally detectable in upper respiratory specimens during the acute phase of infection. The lowest concentration of SARS-CoV-2 viral copies this assay can detect is 138 copies/mL. A negative result does not preclude SARS-Cov-2 infection and should not be used as the sole basis for treatment or other patient management decisions. A negative result may occur with  improper specimen collection/handling, submission of specimen other than nasopharyngeal swab, presence of viral mutation(s) within the areas targeted by this assay, and inadequate number of viral copies(<138 copies/mL). A negative result must be combined with clinical observations, patient history, and epidemiological information. The expected result is Negative.  Fact Sheet for Patients:  BloggerCourse.comhttps://www.fda.gov/media/152166/download  Fact Sheet for Healthcare Providers:  SeriousBroker.ithttps://www.fda.gov/media/152162/download  This test is no t yet approved or cleared by the [place] FDA and  has been authorized for detection and/or diagnosis of SARS-CoV-2 by FDA under an Emergency Use Authorization (EUA). This EUA will remain  in effect (meaning this test can be used) for the duration of the COVID-19 declaration under Section 564(b)(1) of the Act, 21 U.S.C.section 360bbb-3(b)(1), unless the authorization is terminated  or revoked sooner.       Influenza  A by PCR NEGATIVE NEGATIVE Final   Influenza B by PCR NEGATIVE NEGATIVE Final    Comment: (NOTE) The Xpert Xpress SARS-CoV-2/FLU/RSV plus assay is intended as an aid in the diagnosis of influenza from Nasopharyngeal swab specimens and should not be used as a sole basis for treatment. Nasal washings and aspirates are unacceptable for Xpert Xpress SARS-CoV-2/FLU/RSV testing.  Fact Sheet for Patients: BloggerCourse.comhttps://www.fda.gov/media/152166/download  Fact Sheet for Healthcare Providers: SeriousBroker.ithttps://www.fda.gov/media/152162/download  This test is not yet approved  or cleared by the [place] and has been authorized for detection and/or diagnosis of SARS-CoV-2 by FDA under an Emergency Use Authorization (EUA). This EUA will remain in effect (meaning this test can be used) for the duration of the COVID-19 declaration under Section 564(b)(1) of the Act, 21 U.S.C. section 360bbb-3(b)(1), unless the authorization is terminated or revoked.  Performed at [facility], [address], [place], [place] [address]   Culture, blood (single)     Status: None (Preliminary result)   Collection Time: 03/29/21  4:46 PM   Specimen: BLOOD  Result Value Ref Range Status   Specimen Description   Final    BLOOD BLOOD RIGHT FOREARM Performed at [facility], [address] [place], [place] [address]    Special Requests   Final    BOTTLES DRAWN AEROBIC AND ANAEROBIC Blood Culture adequate volume Performed at [facility], [address] [place], [place] [address]    Culture   Final    NO GROWTH 3 DAYS Performed at [facility], [address] [place], [place] [address]    Report Status PENDING  Incomplete  Urine Culture     Status: Abnormal   Collection Time: 03/29/21  8:46 PM   Specimen: Urine, Clean Catch  Result Value Ref Range Status   Specimen Description   Final    URINE, CLEAN CATCH Performed at [facility], [address], [place], [place] [address]    Special Requests   Final    NONE Performed at [facility], [address], [place], [place] [address]    Culture >=100,000 COLONIES/mL PROTEUS MIRABILIS (A)  Final   Report Status 04/01/2021 FINAL  Final   Organism ID, Bacteria PROTEUS MIRABILIS (A)  Final      Susceptibility   Proteus mirabilis - MIC*    AMPICILLIN <=2 SENSITIVE Sensitive     CEFAZOLIN <=4 SENSITIVE Sensitive     CEFEPIME <=0.12 SENSITIVE Sensitive     CEFTRIAXONE <=0.25 SENSITIVE Sensitive     CIPROFLOXACIN <=0.25 SENSITIVE Sensitive     GENTAMICIN <=1 SENSITIVE Sensitive     IMIPENEM 1 SENSITIVE Sensitive     NITROFURANTOIN 128 RESISTANT Resistant     TRIMETH/SULFA <=20 SENSITIVE Sensitive     AMPICILLIN/SULBACTAM <=2 SENSITIVE Sensitive     PIP/TAZO <=4 SENSITIVE Sensitive     * >=100,000 COLONIES/mL PROTEUS MIRABILIS  Culture, blood (single)     Status: None (Preliminary result)   Collection Time: 03/29/21  9:30 PM   Specimen: BLOOD  Result Value Ref Range Status   Specimen Description   Final    BLOOD RIGHT ANTECUBITAL Performed at [facility], [address] [place], [place] [address]    Special Requests   Final    BOTTLES DRAWN AEROBIC AND ANAEROBIC Blood Culture adequate volume Performed at [facility], [address] [place], [place] [address]    Culture   Final    NO GROWTH 3 DAYS Performed at [facility], [address] [place], [place] [address]    Report Status PENDING  Incomplete  MRSA Next Gen by PCR, Nasal     Status: Abnormal   Collection Time: 03/30/21 12:00 AM   Specimen: Nasal Mucosa; Nasal Swab  Result Value Ref Range Status   MRSA by PCR Next Gen DETECTED (A) NOT DETECTED Final    Comment: RESULT CALLED TO, READ BACK BY AND VERIFIED  WITH: KAREN, RN @ 0403 ON 03/30/21 C VARNER (NOTE) The GeneXpert MRSA Assay (FDA approved for NASAL specimens only), is one component of a  comprehensive MRSA colonization surveillance program. It is not intended to diagnose MRSA infection nor to guide or monitor treatment for MRSA infections. Test performance is not FDA approved in patients less than 24 years old. Performed at [facility], [address] [place], [place] [address]   Culture, blood (x 2)     Status: None (Preliminary result)   Collection Time: 03/30/21  1:53 AM   Specimen: BLOOD  Result Value Ref Range Status   Specimen Description   Final    BLOOD BLOOD LEFT HAND Performed at [facility], [address] [place], [place] [address]    Special Requests   Final    BOTTLES DRAWN AEROBIC ONLY Blood Culture adequate volume Performed at [facility], [address] [place], [place] [address]    Culture   Final    NO GROWTH 2 DAYS Performed at [facility], [address] [place], [place] [address]    Report Status PENDING  Incomplete  Culture, blood (x 2)     Status: None (Preliminary result)   Collection Time: 03/30/21  3:11 AM   Specimen: BLOOD  Result Value Ref Range Status   Specimen Description   Final    BLOOD BLOOD LEFT HAND Performed at [facility], [address], [place], [place] [address]    Special Requests   Final    BOTTLES DRAWN AEROBIC ONLY Blood Culture adequate volume Performed at [facility], [address], [place], [place] [address]    Culture   Final    NO GROWTH 2 DAYS Performed at [facility], [address] [place], [place] [address]    Report Status PENDING  Incomplete          Radiology Studies: DG CHEST PORT 1 VIEW  Result Date: 04/01/2021 CLINICAL DATA:  Acute respiratory failure EXAM: PORTABLE CHEST 1 VIEW COMPARISON:  Two days ago FINDINGS: Extubation of the trachea and esophagus. Very low volume chest with dense opacity on the right more than left. Cardiopericardial enlargement. No visible  pneumothorax. IMPRESSION: Low volume chest with dense opacity at the bases that correlates with atelectasis on recent abdominal CT. No worsening after extubation. Electronically Signed   By: Marnee Spring M.D.   On: 04/01/2021 07:32        Scheduled Meds:  apixaban  5 mg Oral BID   atorvastatin  10 mg Oral Daily   carvedilol  6.25 mg Oral BID WC   divalproex  500 mg Oral Q12H   ferrous sulfate  325 mg Oral Daily   fluticasone  2 spray Each Nare Daily   insulin aspart  0-9 Units Subcutaneous TID WC   ipratropium-albuterol  3 mL Nebulization TID   loratadine  10 mg Oral Daily   mouth rinse  15 mL Mouth Rinse BID   mupirocin ointment  1 application Nasal BID   pantoprazole  40 mg Oral Daily   polyethylene glycol  17 g Oral BID   risperiDONE  2 mg Oral BID   senna-docusate  2 tablet Oral QHS   Continuous Infusions:  sodium chloride 10 mL/hr at 03/30/21 2153   sodium chloride Stopped (04/02/21 0137)   cefTRIAXone (ROCEPHIN)  IV Stopped (04/01/21 1404)     LOS: 4 days    Time spent:  45 minutes    Ramiro Harvest, MD [facility]   To contact the attending provider between 7A-7P or the covering provider during after hours 7P-7A, please log into the web site [email or web address] and access using universal [place] password for that web site. If you do not have the password, please call the hospital operator.  04/02/2021, 9:55 AM

## 2021-04-02 NOTE — Progress Notes (Signed)
   NAME:  Becky Hendricks, MRN:  [id number], DOB:  June 03, 1946, LOS: 4 ADMISSION DATE:  03/29/2021, CONSULTATION DATE:  03/30/21 REFERRING MD:  ER , CHIEF COMPLAINT:  ALOC   Brief History:  75y/o female that had a 2 week hx of progressive generalized weakness presented to the ER 8/17 for [facility] and resp distress.  The pt presented from [facility].  Once in the ER the pt was found to have a ph of 7.084 and required emergent intubation. The pt had a similar presentation in April of this year and was found to have MRSA bacteriemia at that time.  Concern for PNA, UTI on admit as source of sepsis.   Pertinent  Medical History  HTN DM Afib Obesity  HFpF Seizure disorder Bipolar Disorder   Significant Hospital Events: Including procedures, antibiotic start and stop dates in addition to other pertinent events   8/17 Admit with ALOC, weakness, respiratory distress.  CT head negative for acute intracranial process, possible sinusitis, CT abd/pelvis with mild bilateral posterior subsegmental atelectasis + pleural effusions, large amt of stool in distal sigmoid colon 8/19 Required reintubation as pt was able to bite pilot balloon in half. Extubated later in am.  8/21 PCCM reconsulted for encephalopathy   Objective   Blood pressure (!) 116/46, pulse (!) 49, temperature 99.5 F (37.5 C), temperature source Oral, resp. rate 16, height 5\' 6"  (1.676 m), weight 107.1 kg, SpO2 99 %.    FiO2 (%):  [32 %-35 %] 35 %   Intake/Output Summary (Last 24 hours) at 04/02/2021 1413 Last data filed at 04/02/2021 1355 Gross per 24 hour  Intake 400.93 ml  Output 450 ml  Net -49.07 ml    Filed Weights   03/31/21 0500 04/01/21 0335 04/02/21 0500  Weight: 108.3 kg 107.7 kg 107.1 kg    Examination: General: adult female lying in bed in NAD HEENT: MM pink/moist, ETT, anicteric  Neuro: sedate, awakens/arouses to voice, attends briefly, nods off shortly thereafter, pupils are reactive CV: s1s2 RRR, no  m/r/g PULM:  non-labored, distant, coarse GI: soft, bsx4 active  Extremities: warm/dry, trace dependent edema  Skin: no rashes or lesions   Assessment & Plan:   Toxic Metabolic Encephalopathy: Initially in the setting of severe sepsis with significant hypercarbia.  This improved over time.  Acute change in mental status this AM.  Less interactive.  At time of evaluation she would awaken to voice.  But drowsy.  Clearly protecting airway.  ABG this morning shows pH 7.29 with PCO2 in the 60s.  Given compensated pH, hypercarbia is not the reason for her current symptoms.  CT head negative. --History of seizures, EEG ordered and no seizure seen --Query of postictal although lasting quite long --Stop Risperdal started 8/20 --Consider catatonia --Consider MRI  Best Practice (right click and "Reselect all SmartList Selections" daily)  Per primary  Critical care time: 34 minutes    9/20, MD 04/02/2021, 2:13 PM   Please see [email or web address] for pager details.   From 7A-7P if no response, please call [phone] After hours, please call ELink [phone]

## 2021-04-02 NOTE — Progress Notes (Signed)
Date and time results received: 04/02/21 0954 (use smartphrase ".now" to insert current time)  Test: PCO2  Critical Value: 69.1  Name of Provider Notified: Janee Morn, MD  Orders Received? Or Actions Taken?:  BiPAP orders received and patient placed on BiPAP

## 2021-04-02 NOTE — Procedures (Signed)
Patient Name: Becky Hendricks  MRN: [id number]  Epilepsy Attending: Charlsie Quest  Referring Physician/Provider: Dr Vilma Meckel Duration: 04/02/2021 1034 to 1444  Patient history: 75yo F with ams. EEG to evaluate for seizure  Level of alertness:  lethargic   AEDs during EEG study: VPA  Technical aspects: This EEG was obtained using a 10 lead EEG system positioned circumferentially without any parasagittal coverage (rapid EEG). Computer selected EEG is reviewed as  well as background features and all clinically significant events.  Description:  EEG showed continuous generalized 5-9Hz  theta-alpha activity as well as  2-3Hz  delta slowing.  Hyperventilation and photic stimulation were not performed.   Abnormality -Continuous slow, generalized   IMPRESSION: This limited ceribell EEG is suggestive of moderate to severe diffuse encephalopathy, nonspecific etiology. No seizures or definite epileptiform discharges were seen throughout the recording.   [name] Annabelle Harman

## 2021-04-02 NOTE — Progress Notes (Signed)
PT Cancellation Note  Patient Details Name: Becky Hendricks MRN: [id number] DOB: 29-Feb-1946   Cancelled Treatment:     New PT order received this date but PT eval deferred at request of RN - states pt not ready.  Will follow.   Logyn Dedominicis 04/02/2021, 2:23 PM

## 2021-04-02 NOTE — Progress Notes (Signed)
Upon assessment pt only responsive to pain. Pt moaning, but no clear speech. Pupils unequal. Unable to follow commands. VSS. CBG 117. MD Janee Morn notified. Consulting civil engineer notified

## 2021-04-03 ENCOUNTER — Inpatient Hospital Stay (HOSPITAL_COMMUNITY): Payer: Medicare Other

## 2021-04-03 DIAGNOSIS — J9622 Acute and chronic respiratory failure with hypercapnia: Secondary | ICD-10-CM

## 2021-04-03 DIAGNOSIS — I5032 Chronic diastolic (congestive) heart failure: Secondary | ICD-10-CM

## 2021-04-03 DIAGNOSIS — J9621 Acute and chronic respiratory failure with hypoxia: Secondary | ICD-10-CM

## 2021-04-03 DIAGNOSIS — G9341 Metabolic encephalopathy: Secondary | ICD-10-CM | POA: Diagnosis not present

## 2021-04-03 DIAGNOSIS — A419 Sepsis, unspecified organism: Secondary | ICD-10-CM | POA: Diagnosis not present

## 2021-04-03 DIAGNOSIS — J9601 Acute respiratory failure with hypoxia: Secondary | ICD-10-CM | POA: Diagnosis not present

## 2021-04-03 DIAGNOSIS — I480 Paroxysmal atrial fibrillation: Secondary | ICD-10-CM | POA: Diagnosis not present

## 2021-04-03 LAB — TROPONIN I (HIGH SENSITIVITY): Troponin I (High Sensitivity): 7 ng/L (ref <18)

## 2021-04-03 LAB — ECHOCARDIOGRAM COMPLETE
AR max vel: 1.72 cm2
AV Area VTI: 1.79 cm2
AV Area mean vel: 1.68 cm2
AV Mean grad: 5 mmHg
AV Peak grad: 10 mmHg
Ao pk vel: 1.58 m/s
Area-P 1/2: 4.57 cm2
Height: 66 in
S' Lateral: 3.3 cm
Single Plane A4C EF: 66.9 %
Weight: 3777.8 oz

## 2021-04-03 LAB — CBC WITH DIFFERENTIAL/PLATELET
Abs Immature Granulocytes: 0.08 10*3/uL — ABNORMAL HIGH (ref 0.00–0.07)
Basophils Absolute: 0 10*3/uL (ref 0.0–0.1)
Basophils Relative: 0 %
Eosinophils Absolute: 0 10*3/uL (ref 0.0–0.5)
Eosinophils Relative: 0 %
HCT: 34.8 % — ABNORMAL LOW (ref 36.0–46.0)
Hemoglobin: 10.5 g/dL — ABNORMAL LOW (ref 12.0–15.0)
Immature Granulocytes: 1 %
Lymphocytes Relative: 16 %
Lymphs Abs: 1.1 10*3/uL (ref 0.7–4.0)
MCH: 28 pg (ref 26.0–34.0)
MCHC: 30.2 g/dL (ref 30.0–36.0)
MCV: 92.8 fL (ref 80.0–100.0)
Monocytes Absolute: 0.3 10*3/uL (ref 0.1–1.0)
Monocytes Relative: 4 %
Neutro Abs: 5.4 10*3/uL (ref 1.7–7.7)
Neutrophils Relative %: 79 %
Platelets: 203 10*3/uL (ref 150–400)
RBC: 3.75 MIL/uL — ABNORMAL LOW (ref 3.87–5.11)
RDW: 14.9 % (ref 11.5–15.5)
WBC: 6.8 10*3/uL (ref 4.0–10.5)
nRBC: 0 % (ref 0.0–0.2)

## 2021-04-03 LAB — BASIC METABOLIC PANEL
Anion gap: 7 (ref 5–15)
BUN: 14 mg/dL (ref 8–23)
CO2: 31 mmol/L (ref 22–32)
Calcium: 8.5 mg/dL — ABNORMAL LOW (ref 8.9–10.3)
Chloride: 96 mmol/L — ABNORMAL LOW (ref 98–111)
Creatinine, Ser: 0.52 mg/dL (ref 0.44–1.00)
GFR, Estimated: >60 mL/min (ref >60)
Glucose, Bld: 291 mg/dL — ABNORMAL HIGH (ref 70–99)
Potassium: 4.4 mmol/L (ref 3.5–5.1)
Sodium: 134 mmol/L — ABNORMAL LOW (ref 135–145)

## 2021-04-03 LAB — CULTURE, BLOOD (SINGLE)
Culture: NO GROWTH
Culture: NO GROWTH
Special Requests: ADEQUATE
Special Requests: ADEQUATE

## 2021-04-03 LAB — LACTIC ACID, PLASMA: Lactic Acid, Venous: 2.8 mmol/L (ref 0.5–1.9)

## 2021-04-03 LAB — GLUCOSE, CAPILLARY
Glucose-Capillary: 165 mg/dL — ABNORMAL HIGH (ref 70–99)
Glucose-Capillary: 274 mg/dL — ABNORMAL HIGH (ref 70–99)
Glucose-Capillary: 223 mg/dL — ABNORMAL HIGH (ref 70–99)

## 2021-04-03 LAB — BLOOD GAS, ARTERIAL
Acid-Base Excess: 7.7 mmol/L — ABNORMAL HIGH (ref 0.0–2.0)
Bicarbonate: 33.6 mmol/L — ABNORMAL HIGH (ref 20.0–28.0)
O2 Saturation: 92.9 %
Patient temperature: 98.6
pCO2 arterial: 56.3 mmHg — ABNORMAL HIGH (ref 32.0–48.0)
pH, Arterial: 7.393 (ref 7.350–7.450)
pO2, Arterial: 64.8 mmHg — ABNORMAL LOW (ref 83.0–108.0)

## 2021-04-03 LAB — BRAIN NATRIURETIC PEPTIDE: B Natriuretic Peptide: 234.6 pg/mL — ABNORMAL HIGH (ref 0.0–100.0)

## 2021-04-03 LAB — MAGNESIUM: Magnesium: 2.2 mg/dL (ref 1.7–2.4)

## 2021-04-03 LAB — PHOSPHORUS: Phosphorus: 2.4 mg/dL — ABNORMAL LOW (ref 2.5–4.6)

## 2021-04-03 MED ORDER — CARVEDILOL 6.25 MG PO TABS
6.2500 mg | ORAL_TABLET | Freq: Two times a day (BID) | ORAL | Status: DC
  Administered 2021-04-04 – 2021-04-07 (×7): 6.25 mg via ORAL
  Filled 2021-04-03 (×8): qty 1

## 2021-04-03 MED ORDER — DIVALPROEX SODIUM 250 MG PO DR TAB
500.0000 mg | DELAYED_RELEASE_TABLET | Freq: Two times a day (BID) | ORAL | Status: DC
  Administered 2021-04-03 – 2021-04-07 (×8): 500 mg via ORAL
  Filled 2021-04-03: qty 2
  Filled 2021-04-03: qty 1
  Filled 2021-04-03 (×2): qty 2
  Filled 2021-04-03 (×4): qty 1
  Filled 2021-04-03: qty 2

## 2021-04-03 MED ORDER — METHYLPREDNISOLONE SODIUM SUCC 125 MG IJ SOLR: 90.0000 mg | Freq: Two times a day (BID) | INTRAMUSCULAR | Status: DC

## 2021-04-03 MED ORDER — FUROSEMIDE 10 MG/ML IJ SOLN
40.0000 mg | Freq: Once | INTRAMUSCULAR | Status: AC
  Administered 2021-04-03: 40 mg via INTRAVENOUS
  Filled 2021-04-03: qty 4

## 2021-04-03 MED ORDER — PANTOPRAZOLE SODIUM 40 MG PO TBEC
40.0000 mg | DELAYED_RELEASE_TABLET | Freq: Every day | ORAL | Status: DC
  Administered 2021-04-04 – 2021-04-07 (×4): 40 mg via ORAL
  Filled 2021-04-03 (×4): qty 1

## 2021-04-03 MED ORDER — METHYLPREDNISOLONE SODIUM SUCC 40 MG IJ SOLR
40.0000 mg | Freq: Every day | INTRAMUSCULAR | Status: DC
  Administered 2021-04-03 – 2021-04-05 (×3): 40 mg via INTRAVENOUS
  Filled 2021-04-03 (×3): qty 1

## 2021-04-03 MED ORDER — K PHOS MONO-SOD PHOS DI & MONO 155-852-130 MG PO TABS
250.0000 mg | ORAL_TABLET | Freq: Two times a day (BID) | ORAL | Status: AC
  Administered 2021-04-03 – 2021-04-06 (×6): 250 mg via ORAL
  Filled 2021-04-03 (×7): qty 1

## 2021-04-03 MED ORDER — METHYLPREDNISOLONE SODIUM SUCC 125 MG IJ SOLR: 60.0000 mg | Freq: Three times a day (TID) | INTRAMUSCULAR | Status: DC

## 2021-04-03 MED ORDER — LORAZEPAM 2 MG/ML IJ SOLN
0.5000 mg | Freq: Once | INTRAMUSCULAR | Status: AC
  Administered 2021-04-03: 0.5 mg via INTRAVENOUS
  Filled 2021-04-03: qty 1

## 2021-04-03 NOTE — Evaluation (Signed)
Physical Therapy Evaluation Patient Details Name: Becky Hendricks MRN: [id number] DOB: February 02, 1946 Today's Date: 04/03/2021   History of Present Illness  75 year-old female with medical history of afib, bipolar disorder, HTN, type 2 DM, and osteomyelitis of coccyx/wound,. She presented to the ED 03/29/21 with  hx of progressive generalized weakness and respiratory distress. She required emergent intubation in the ED due to pH of 7.084, extubated 03/31/21.  Clinical Impression  The patient is essentially at her baseline of total care, comes from SNF. Patient emotional, tangential thoughts.  Patient known to this writer from previous admission and  does not require any further skill;ed PT. PT will sign off.    Follow Up Recommendations No PT follow up    Equipment Recommendations  None recommended by PT    Recommendations for Other Services       Precautions / Restrictions Precautions Precautions: Fall Precaution Comments: incontinence      Mobility  Bed Mobility Overal bed mobility: Needs Assistance Bed Mobility: Rolling Rolling: +2 for safety/equipment;+2 for physical assistance;Total assist         General bed mobility comments: unable to assist  with rolls beyond reaching. for rail    Transfers                 General transfer comment: needs  mechanical lift  if OOB.  Ambulation/Gait                Stairs            Wheelchair Mobility    Modified Rankin (Stroke Patients Only)       Balance                                             Pertinent Vitals/Pain Pain Assessment: No/denies pain Faces Pain Scale: No hurt    Home Living     Available Help at Discharge: Skilled Nursing Facility             Additional Comments: patient has been in long term SNF, Patient requires total care and OOB  via mechanical  lift.    Prior Function           Comments: unable to determin PLOF, per chart review assist from SNF  staff for ADLs, patient total assist and not OOB in april , last admission.     Hand Dominance        Extremity/Trunk Assessment   Upper Extremity Assessment Upper Extremity Assessment: RUE deficits/detail RUE Deficits / Details: fingers digging into palm. Palm protector  provided    Lower Extremity Assessment Lower Extremity Assessment:  (both ankles plantarfexed contractures, able to lift leg a few inches and flex legs.)       Communication   Communication: No difficulties  Cognition Arousal/Alertness: Awake/alert Behavior During Therapy: Anxious Overall Cognitive Status: History of cognitive impairments - at baseline                                        General Comments      Exercises     Assessment/Plan    PT Assessment Patent does not need any further PT services  PT Problem List         PT Treatment Interventions      PT Goals (  Current goals can be found in the Care Plan section)  Acute Rehab PT Goals Patient Stated Goal: none stated PT Goal Formulation: All assessment and education complete, DC therapy    Frequency     Barriers to discharge        Co-evaluation               AM-PAC PT "6 Clicks" Mobility  Outcome Measure Help needed turning from your back to your side while in a flat bed without using bedrails?: Total Help needed moving from lying on your back to sitting on the side of a flat bed without using bedrails?: Total Help needed moving to and from a bed to a chair (including a wheelchair)?: Total Help needed standing up from a chair using your arms (e.g., wheelchair or bedside chair)?: Total Help needed to walk in hospital room?: Total Help needed climbing 3-5 steps with a railing? : Total 6 Click Score: 6    End of Session Equipment Utilized During Treatment: Oxygen Activity Tolerance: Patient tolerated treatment well Patient left: in bed;with call bell/phone within reach;with bed alarm set Nurse  Communication: Need for lift equipment;Mobility status PT Visit Diagnosis: Muscle weakness (generalized) (M62.81);Other abnormalities of gait and mobility (R26.89);Hemiplegia and hemiparesis    Time: 1020-1050 PT Time Calculation (min) (ACUTE ONLY): 30 min   Charges:   PT Evaluation $PT Eval Low Complexity: 1 Low PT Treatments $Therapeutic Activity: 8-22 mins        Blanchard Kelch PT Acute Rehabilitation Services Pager [phone] Office [phone]   Rada Hay 04/03/2021, 1:32 PM

## 2021-04-03 NOTE — Progress Notes (Signed)
Pt is verbally aggressive toward staff. Needy and irritable. Pt's mood has been labile going from tearfulness to shouting and cursing at staff. Consulting civil engineer and MD notified. One time dose of 0.5 mg ativan given per order. Will continue to monitor.

## 2021-04-03 NOTE — Progress Notes (Signed)
Lab review shows slight hypercarbia although pH is okay, slight low phosphorus, slightly elevated lactic acid although it is unclear what her lactic acid would have been after 03/29/2021  Her troponin is normal.  BNP slightly high.  Echo results are pending  Plan - Very likely has OSA/OHV and therefore will start overnight BiPAP -We will trend her troponin through tomorrow and also recheck lactic acid tomorrow -Check procalcitonin tomorrow -Await echo results from today -Keep in stepdown unit 1 more night    LABS   Results for INDA, MCGLOTHEN (MRN [id number]) as of 04/03/2021 16:41  Ref. Range 04/03/2021 13:33  B Natriuretic Peptide Latest Ref Range: 0.0 - 100.0 pg/mL 234.6 (H)  Troponin I (High Sensitivity) Latest Ref Range: <18 ng/L 7   PULMONARY Recent Labs  Lab 03/29/21 2308 03/30/21 0532 04/02/21 0940 04/02/21 1623 04/03/21 1406  PHART 7.367 7.496* 7.289* 7.343* 7.393  PCO2ART 51.5* 41.1 69.1* 62.1* 56.3*  PO2ART 129* 102 69.8* 79.1* 64.8*  HCO3 29.7* 31.4* 32.1* 32.8* 33.6*  O2SAT 99.4 98.3 92.8 95.7 92.9    CBC Recent Labs  Lab 04/01/21 0242 04/02/21 0304 04/03/21 0311  HGB 11.6* 10.6* 10.5*  HCT 38.9 35.9* 34.8*  WBC 10.3 10.7* 6.8  PLT 192 191 203    COAGULATION No results for input(s): INR in the last 168 hours.  CARDIAC  No results for input(s): TROPONINI in the last 168 hours. No results for input(s): PROBNP in the last 168 hours.   CHEMISTRY Recent Labs  Lab 03/29/21 1614 03/29/21 2130 03/30/21 0311 03/31/21 0308 04/01/21 0242 04/02/21 0304 04/03/21 0311 04/03/21 1333  NA 132*  --  131*  --  138 135 134*  --   K 4.9  --  4.9  --  3.8 3.9 4.4  --   CL 87*  --  91*  --  101 96* 96*  --   CO2 36*  --  30  --  29 32 31  --   GLUCOSE 121*  --  129*  --  115* 117* 291*  --   BUN 16  --  22  --  14 11 14   --   CREATININE 0.55  --  0.53 0.69 0.47 0.50 0.52  --   CALCIUM 8.6*  --  8.3*  --  8.2* 8.2* 8.5*  --   MG  --  2.0 1.8  --  1.9  --    --  2.2  PHOS  --  6.3* 4.3  --   --   --   --  2.4*   Estimated Creatinine Clearance: 77.5 mL/min (by C-G formula based on SCr of 0.52 mg/dL).   LIVER Recent Labs  Lab 03/29/21 1614 03/30/21 0311  AST 9* 12*  ALT 9 9  ALKPHOS 39 35*  BILITOT 0.4 0.4  PROT 6.3* 5.4*  ALBUMIN 3.1* 2.6*     INFECTIOUS Recent Labs  Lab 03/29/21 1610 03/29/21 2130 04/03/21 1333  LATICACIDVEN 1.0  --  2.8*  PROCALCITON  --  <0.10  --      ENDOCRINE CBG (last 3)  Recent Labs    04/02/21 1650 04/03/21 0806 04/03/21 1257  GLUCAP 146* 165* 274*         IMAGING x48h  - image(s) personally visualized  -   highlighted in bold CT HEAD WO CONTRAST (04/05/21)  Result Date: 04/02/2021 CLINICAL DATA:  TIA. Increased altered mental status and asymmetric pupils. EXAM: CT HEAD WITHOUT CONTRAST TECHNIQUE: Contiguous axial images were obtained  from the base of the skull through the vertex without intravenous contrast. COMPARISON:  03/29/2021 FINDINGS: Brain: No evidence of acute infarction, hemorrhage, hydrocephalus, extra-axial collection or mass lesion/mass effect. Generalized atrophy. Vascular: No hyperdense vessel or unexpected calcification. Skull: Normal. Negative for fracture or focal lesion. Sinuses/Orbits: Patchy opacification of paranasal sinuses, overall mild. IMPRESSION: No acute or interval finding. Electronically Signed   By: Marnee Spring M.D.   On: 04/02/2021 09:53   MR BRAIN WO CONTRAST  Result Date: 04/02/2021 CLINICAL DATA:  Mental status change, unknown cause EXAM: MRI HEAD WITHOUT CONTRAST TECHNIQUE: Multiplanar, multiecho pulse sequences of the brain and surrounding structures were obtained without intravenous contrast. COMPARISON:  11/19/2020 FINDINGS: Brain: There is no acute infarction or intracranial hemorrhage. There is no intracranial mass, mass effect, or edema. There is no hydrocephalus or extra-axial fluid collection. Prominence of the ventricles and sulci reflects  generalized parenchymal volume loss. Patchy T2 hyperintensity in the supratentorial white matter is nonspecific but may reflect minor chronic microvascular ischemic changes. Signal abnormality on the prior study has resolved. Vascular: Major vessel flow voids at the skull base are preserved. Skull and upper cervical spine: Normal marrow signal is preserved. Sinuses/Orbits: Mild mucosal thickening.  Orbits are unremarkable. Other: Sella is unremarkable. Mild patchy mastoid fluid opacification. IMPRESSION: No acute infarction, hemorrhage, or mass. Electronically Signed   By: Guadlupe Spanish M.D.   On: 04/02/2021 18:36

## 2021-04-03 NOTE — Progress Notes (Signed)
*  PRELIMINARY RESULTS* Echocardiogram 2D Echocardiogram has been performed.  Neomia Dear RDCS 04/03/2021, 2:51 PM

## 2021-04-03 NOTE — Progress Notes (Signed)
Notified Lab that BAG being sent for analysis. °

## 2021-04-03 NOTE — Progress Notes (Signed)
Nutrition Follow-up  DOCUMENTATION CODES:   Obesity unspecified  INTERVENTION:  - will monitor for nutrition-related interventions.    NUTRITION DIAGNOSIS:   Increased nutrient needs related to acute illness as evidenced by estimated needs. -ongoing  GOAL:   Patient will meet greater than or equal to 90% of their needs -progressing  MONITOR:   PO intake, Labs, Weight trends  ASSESSMENT:   75 year-old female with medical history of afib, bipolar disorder, HTN, type 2 DM, and osteomyelitis. She presented to the ED due to 2-week hx of progressive generalized weakness and respiratory distress. She required emergent intubation in the ED due to pH of 7.084.  Patient was extubated on 8/19 at ~1030 and OGT removed at that time. Diet advanced to CLD on 8/20 at 0740, to FLD on 8/20 at 1018, to Dysphagia 3 thin liquids on 8/20 at 1545, and to Carb Modified thin liquids today at 1056 (after consuming breakfast and after working with SLP).  Patient discussed in rounds and with RN separately. Patient has been verbally aggressive with all staff throughout shift so opted not to enter patient's room. RN reports patient ate 100% of breakfast which consisted of scrambled eggs, grits, and applesauce.   Weight +4 lb from 8/18-8/21. Mild pitting edema to all extremities documented in the edema section of flow sheet.    Patient is from SNF and likely plan at time of d/c is to return to SNF.   Labs reviewed; CBG: 165 mg/dl, Na: 026 mmol/l, Cl: 96 mmol/l, Ca: 8.5 mg/dl.  Medications reviewed; 325 mg ferrous sulfate/day, sliding scale novolog, 40 mg solu-medrol/day, 40 mg IV protonix/day, 17 g miralax BID, 2 tablets senokot/day.    Diet Order:   Diet Order             Diet Carb Modified Fluid consistency: Thin; Room service appropriate? Yes  Diet effective now                   EDUCATION NEEDS:   No education needs have been identified at this time  Skin:  Skin Assessment: Reviewed RN  Assessment  Last BM:  8/22 (type 6 x1)  Height:   Ht Readings from Last 1 Encounters:  03/30/21 5\' 6"  (1.676 m)    Weight:   Wt Readings from Last 1 Encounters:  04/02/21 107.1 kg     Estimated Nutritional Needs:  Kcal:  1750-2000 kcal Protein:  90-105 grams Fluid:  >/= 1.8 L/day     04/04/21, MS, RD, LDN, CNSC Inpatient Clinical Dietitian RD pager # available in AMION  After hours/weekend pager # available in [facility]

## 2021-04-03 NOTE — Progress Notes (Signed)
NAME:  Becky Hendricks, MRN:  [id number], DOB:  June 14, 1946, LOS: 5 ADMISSION DATE:  03/29/2021, CONSULTATION DATE:  03/30/21 REFERRING MD:  ER , CHIEF COMPLAINT:  ALOC   Brief History:  75y/o female that had a 2 week hx of progressive generalized weakness presented to the ER 8/17 for [facility] and resp distress.  The pt presented from [facility].  Once in the ER the pt was found to have a ph of 7.084 and required emergent intubation. The pt had a similar presentation in April of this year and was found to have MRSA bacteriemia at that time.  Concern for PNA, UTI on admit as source of sepsis.   Pertinent  Medical History  HTN DM Afib Obesity  HFpF Seizure disorder Bipolar Disorder   Significant Hospital Events: Including procedures, antibiotic start and stop dates in addition to other pertinent events   8/17 Admit with ALOC, weakness, respiratory distress.  CT head negative for acute intracranial process, possible sinusitis, CT abd/pelvis with mild bilateral posterior subsegmental atelectasis + pleural effusions, large amt of stool in distal sigmoid colon 8/19 Required reintubation as pt was able to bite pilot balloon in half. Extubated later in am.  8/21 PCCM reconsulted for encephalopathy   Subjective   Opens eyes to voice, no complaints. Oriented to person and place.  Moving all extremities.  Objective   Blood pressure (!) 113/49, pulse (!) 56, temperature 98.2 F (36.8 C), temperature source Oral, resp. rate (!) 28, height 5\' 6"  (1.676 m), weight 107.1 kg, SpO2 97 %.    Vent Mode: BIPAP FiO2 (%):  [32 %-35 %] 35 % PEEP:  [6 cmH20] 6 cmH20 Pressure Support:  [10 cmH20] 10 cmH20 Plateau Pressure:  [17 cmH20] 17 cmH20   Intake/Output Summary (Last 24 hours) at 04/03/2021 0802 Last data filed at 04/03/2021 0723 Gross per 24 hour  Intake 327.63 ml  Output 650 ml  Net -322.37 ml    Filed Weights   03/31/21 0500 04/01/21 0335 04/02/21 0500  Weight: 108.3 kg 107.7 kg 107.1  kg    Examination: General: Adult female, resting in bed, in NAD. Neuro: Sleeping but easily arouseable to voice.  Follows basic commands. HEENT: [place]/AT. Sclerae anicteric. Cardiovascular: RRR, no M/R/G.  Lungs: Respirations even and unlabored.  CTA bilaterally, No W/R/R. Abdomen: BS x 4, soft, NT/ND.  Musculoskeletal: No gross deformities, no edema.  Skin: Intact, warm, no rashes.   Assessment & Plan:   Toxic Metabolic Encephalopathy: Initially in the setting of severe sepsis with significant hypercarbia.  This improved over time.  Acute change in mental status 8/21 AM.  ABG with pH 7.29 with PCO2 in the 60s. CT head negative.  EEG with diffuse encephalopathy.  Question that she had polypharmacy and medication effect with Risperdal (this was held 8/21 and exam 8/22 does seem much improved).  Also on moderately dosed steroids (90mg  Solumedrol BID which could be playing a part). - Continue to hold Risperdal and all other sedating meds. - Drop solumedrol to 40mg  daily and stop over next day or two. - Consider catatonia  Seems much improved.  Will follow peripherally over course of day and AM 8/23.  If stable will sign off.   Best Practice (right click and "Reselect all SmartList Selections" daily)  Per primary   , PA - [facility] For pager details, please see AMION or use Epic chat  After 1900, please call [facility] for cross coverage needs 04/03/2021, 8:23  AM    

## 2021-04-03 NOTE — Progress Notes (Signed)
PROGRESS NOTE    Becky Hendricks  ZOX:[id number] DOB: Jun 15, 1946 DOA: 03/29/2021 PCP: Roderic Scarce, MD    Chief Complaint  Patient presents with   Fatigue   Altered Mental Status   Hypoxia    Brief Narrative:  75y/o female that had a 2 week hx of progressive generalized weakness presented to the ER 8/17 for [facility] and resp distress.  The pt presented from [facility].  Once in the ER the pt was found to have a ph of 7.084 and required emergent intubation. The pt had a similar presentation in April of this year and was found to have MRSA bacteriemia at that time.  Concern for PNA, UTI on admit as source of sepsis.    Assessment & Plan:   Principal Problem:   Severe sepsis (HCC) Active Problems:   Metabolic encephalopathy   Urinary tract infection without hematuria   AF (paroxysmal atrial fibrillation) (HCC)   Sacral wound   Bipolar disorder (HCC)   Type 2 diabetes mellitus with hyperglycemia, with long-term current use of insulin (HCC)   Encephalopathy   Acute respiratory failure with hypoxia (HCC)   Hyponatremia   Functional quadriplegia (HCC)   Pneumonia of lower lobe due to infectious organism   Colitis   Acute respiratory failure (HCC)   1 acute hypoxic hypercarbic respiratory failure/pneumonia, POA -Patient was intubated on admission and placed empirically on IV antibiotics. -Patient extubated currently on 3 L nasal cannula. -Patient lethargic the morning of 04/02/2021 and was not following commands.  -CT head and MRI head negative for any acute abnormalities. -ABG with a respiratory acidosis. -IV antibiotics narrowed down to Rocephin which we will continue. -Placed on BiPAP with clinical improvement.  Currently off BiPAP. -Continue Pulmicort, Xopenex and Atrovent nebs, IV Solu-Medrol taper. -Discontinued duo nebs. -PCCM ff. -Follow.  2.  Severe sepsis with septic shock secondary to pneumonia/Proteus Mirabilis UTI, POA -Currently afebrile.  Patient initially  improving. -Patient lethargic the morning of 04/02/2021 which has subsequently improved.  See #3.  -Currently not on pressors. -Urine cultures with > 100,000 colonies of Proteus Mirabilis -Blood cultures pending with no growth to date. -Fever curve trending down. -Urine Legionella antigen negative, urine pneumococcus antigen negative. -IV antibiotics narrowed down to IV Rocephin which we will continue. -Follow-up.  3.  Acute metabolic encephalopathy/seizure disorder/bipolar disorder -Acute metabolic encephalopathy initially felt likely secondary to sepsis infection. -Patient with no noted seizures early on during the hospitalization however on 04/02/2021 patient became significantly lethargic and concern for possible postictal state. -Head CT and MRI head done were negative for any acute abnormalities.   -Limited Ceribell EEG suggestive of moderate to severe diffuse encephalopathy, nonspecific, no seizures or definite epileptiform discharges were seen throughout the recording. -Adult EEG pending. -Ammonia level within normal limits.  -ABG obtained with a respiratory acidosis. -Patient was placed on BiPAP.   -Per RN patient suddenly woke up and more alert around 5 PM on 04/02/2021.  ??  Supratentorial vs catatonia vs secondary to effect from risperidone which has been discontinued. -Decrease Solu-Medrol to 60 mg IV every 8 hours and rapidly taper to off.  -Continue Pulmicort, Xopenex and Atrovent nebs, PPI, Flonase. -Continue empiric IV antibiotics. -Continue IV Depakote and if mental status remains stable could likely transition back to oral Depakote in 24 hours. -Follow.  4.  Paroxysmal A. fib/bradycardia -Patient noted to have briefly gone into a bout of A. fib the evening of 04/01/2021, given a dose of IV Lopressor and started back on home  dose oral Coreg with rate control. -Patient back in A. fib with controlled rate. -Patient noted to be lethargic 04/02/2021 and as such placed on  scheduled IV Lopressor and Coreg discontinued. -Eliquis for anticoagulation. -If mental status remains stable throughout the day could likely transition back to home regimen of oral Coreg tomorrow.  5.  Diabetes mellitus type 2 -Hemoglobin A1c 7.0  -CBG 165 this morning.   -SSI.   -Monitor CBGs on IV steroids.   6.  Deconditioned -PT/OT. -We will likely need SNF placement.  7.  Constipation -Continue current bowel regimen of MiraLAX twice daily, Senokot-S daily.         DVT prophylaxis: Eliquis Code Status: Full Family Communication: Updated patient.  No family at bedside. Disposition:   Status is: Inpatient  Remains inpatient appropriate because:Inpatient level of care appropriate due to severity of illness  Dispo: The patient is from: SNF              Anticipated d/c is to: SNF              Patient currently is not medically stable to d/c.   Difficult to place patient No       Consultants:  PCCM  Procedures:  Chest x-ray 04/01/2021, 03/30/2021, 03/29/2021 CT abdomen and pelvis 03/29/2021 CT head 03/29/2021 Abdominal films 03/29/2021 CT head  04/02/2021 MRI brain 04/02/2021 Rapid EEG/limited ceribell EEG 04/02/2021  Significant Hospital Events: Including procedures, antibiotic start and stop dates in addition to other pertinent events   8/17 Admit with ALOC, weakness, respiratory distress.  CT head negative for acute intracranial process, possible sinusitis, CT abd/pelvis with mild bilateral posterior subsegmental atelectasis + pleural effusions, large amt of stool in distal sigmoid colon 8/19 Required reintubation as pt was able to bite pilot balloon in half. Extubated later in am.     Antimicrobials:  IV azithromycin 8/18/ 2022x1 IV Rocephin 03/30/2021>>>>>   Subjective: Sitting up in bed very talkative this morning.  Alert and oriented to self place and time.  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  Denies any chest pain.  No shortness of  breath.  No abdominal pain.  Some bouts of intermittent confusion.  Per RN patient became suddenly alert and awake around 5 PM chest before MRI of the head was done on 04/02/2021.   Very talkative this morning.  Off BiPAP.  Objective: Vitals:   04/03/21 0600 04/03/21 0700 04/03/21 0810 04/03/21 0837  BP:   (!) 150/64   Pulse: (!) 53 (!) 56 60   Resp: (!) 26 (!) 28 (!) 22   Temp:      TempSrc:      SpO2: 96% 97% 97% 99%  Weight:      Height:        Intake/Output Summary (Last 24 hours) at 04/03/2021 0943 Last data filed at 04/03/2021 0723 Gross per 24 hour  Intake 327.63 ml  Output 650 ml  Net -322.37 ml    Filed Weights   03/31/21 0500 04/01/21 0335 04/02/21 0500  Weight: 108.3 kg 107.7 kg 107.1 kg    Examination:  General exam: Alert.  NAD. Respiratory system: CTA B anterior lung fields.  Decreased breath sounds in the bases.  No rhonchi.  Normal respiratory effort.  Cardiovascular system: Irregularly irregular.  No murmurs rubs or gallops.  No JVD.  1-2+ bilateral lower extremity edema Gastrointestinal system: Abdomen is soft, nontender, nondistended, positive bowel sounds.  No rebound.  No guarding.   Central nervous system:  Alert.  Following commands.  Moving extremities spontaneously.  Talkative this morning.  Extremities: Symmetric 5 x 5 power. Skin: No rashes, lesions or ulcers Psychiatry: Judgement and insight fair.  Mood and affect appropriate.    Data Reviewed: I have personally reviewed following labs and imaging studies  CBC: Recent Labs  Lab 03/29/21 1614 03/30/21 0311 04/01/21 0242 04/02/21 0304 04/03/21 0311  WBC 12.7* 8.3 10.3 10.7* 6.8  NEUTROABS 7.7 6.2  --   --  5.4  HGB 12.2 10.8* 11.6* 10.6* 10.5*  HCT 39.1 34.6* 38.9 35.9* 34.8*  MCV 91.8 91.3 93.5 94.2 92.8  PLT 193 169 192 191 203     Basic Metabolic Panel: Recent Labs  Lab 03/29/21 1614 03/29/21 2130 03/30/21 0311 03/31/21 0308 04/01/21 0242 04/02/21 0304 04/03/21 0311  NA  132*  --  131*  --  138 135 134*  K 4.9  --  4.9  --  3.8 3.9 4.4  CL 87*  --  91*  --  101 96* 96*  CO2 36*  --  30  --  29 32 31  GLUCOSE 121*  --  129*  --  115* 117* 291*  BUN 16  --  22  --  14 11 14   CREATININE 0.55  --  0.53 0.69 0.47 0.50 0.52  CALCIUM 8.6*  --  8.3*  --  8.2* 8.2* 8.5*  MG  --  2.0 1.8  --  1.9  --   --   PHOS  --  6.3* 4.3  --   --   --   --      GFR: Estimated Creatinine Clearance: 77.5 mL/min (by C-G formula based on SCr of 0.52 mg/dL).  Liver Function Tests: Recent Labs  Lab 03/29/21 1614 03/30/21 0311  AST 9* 12*  ALT 9 9  ALKPHOS 39 35*  BILITOT 0.4 0.4  PROT 6.3* 5.4*  ALBUMIN 3.1* 2.6*     CBG: Recent Labs  Lab 04/02/21 0945 04/02/21 1148 04/02/21 1207 04/02/21 1650 04/03/21 0806  GLUCAP 103* 102* 101* 146* 165*      Recent Results (from the past 240 hour(s))  Resp Panel by RT-PCR (Flu A&B, Covid) Nasopharyngeal Swab     Status: None   Collection Time: 03/29/21  4:12 PM   Specimen: Nasopharyngeal Swab; Nasopharyngeal(NP) swabs in vial transport medium  Result Value Ref Range Status   SARS Coronavirus 2 by RT PCR NEGATIVE NEGATIVE Final    Comment: (NOTE) SARS-CoV-2 target nucleic acids are NOT DETECTED.  The SARS-CoV-2 RNA is generally detectable in upper respiratory specimens during the acute phase of infection. The lowest concentration of SARS-CoV-2 viral copies this assay can detect is 138 copies/mL. A negative result does not preclude SARS-Cov-2 infection and should not be used as the sole basis for treatment or other patient management decisions. A negative result may occur with  improper specimen collection/handling, submission of specimen other than nasopharyngeal swab, presence of viral mutation(s) within the areas targeted by this assay, and inadequate number of viral copies(<138 copies/mL). A negative result must be combined with clinical observations, patient history, and epidemiological information. The  expected result is Negative.  Fact Sheet for Patients:  03/31/21  Fact Sheet for Healthcare Providers:  BloggerCourse.com  This test is no t yet approved or cleared by the [place] FDA and  has been authorized for detection and/or diagnosis of SARS-CoV-2 by FDA under an Emergency Use Authorization (EUA). This EUA will remain  in effect (meaning this  test can be used) for the duration of the COVID-19 declaration under Section 564(b)(1) of the Act, 21 U.S.C.section 360bbb-3(b)(1), unless the authorization is terminated  or revoked sooner.       Influenza A by PCR NEGATIVE NEGATIVE Final   Influenza B by PCR NEGATIVE NEGATIVE Final    Comment: (NOTE) The Xpert Xpress SARS-CoV-2/FLU/RSV plus assay is intended as an aid in the diagnosis of influenza from Nasopharyngeal swab specimens and should not be used as a sole basis for treatment. Nasal washings and aspirates are unacceptable for Xpert Xpress SARS-CoV-2/FLU/RSV testing.  Fact Sheet for Patients: BloggerCourse.com  Fact Sheet for Healthcare Providers: SeriousBroker.it  This test is not yet approved or cleared by the [place] FDA and has been authorized for detection and/or diagnosis of SARS-CoV-2 by FDA under an Emergency Use Authorization (EUA). This EUA will remain in effect (meaning this test can be used) for the duration of the COVID-19 declaration under Section 564(b)(1) of the Act, 21 U.S.C. section 360bbb-3(b)(1), unless the authorization is terminated or revoked.  Performed at [facility], [address] [place], [place] [address]   Culture, blood (single)     Status: None   Collection Time: 03/29/21  4:46 PM   Specimen: BLOOD  Result Value Ref Range Status   Specimen Description   Final    BLOOD BLOOD RIGHT FOREARM Performed at [facility], [address],  [place], [place] [address]    Special Requests   Final    BOTTLES DRAWN AEROBIC AND ANAEROBIC Blood Culture adequate volume Performed at [facility], [address] [place], [place] [address]    Culture   Final    NO GROWTH 5 DAYS Performed at [facility], [address] [place], [place] [address]    Report Status 04/03/2021 FINAL  Final  Urine Culture     Status: Abnormal   Collection Time: 03/29/21  8:46 PM   Specimen: Urine, Clean Catch  Result Value Ref Range Status   Specimen Description   Final    URINE, CLEAN CATCH Performed at [facility], [address], [place], [place] [address]    Special Requests   Final    NONE Performed at [facility], [address] [place], [place] [address]    Culture >=100,000 COLONIES/mL PROTEUS MIRABILIS (A)  Final   Report Status 04/01/2021 FINAL  Final   Organism ID, Bacteria PROTEUS MIRABILIS (A)  Final      Susceptibility   Proteus mirabilis - MIC*    AMPICILLIN <=2 SENSITIVE Sensitive     CEFAZOLIN <=4 SENSITIVE Sensitive     CEFEPIME <=0.12 SENSITIVE Sensitive     CEFTRIAXONE <=0.25 SENSITIVE Sensitive     CIPROFLOXACIN <=0.25 SENSITIVE Sensitive     GENTAMICIN <=1 SENSITIVE Sensitive     IMIPENEM 1 SENSITIVE Sensitive     NITROFURANTOIN 128 RESISTANT Resistant     TRIMETH/SULFA <=20 SENSITIVE Sensitive     AMPICILLIN/SULBACTAM <=2 SENSITIVE Sensitive     PIP/TAZO <=4 SENSITIVE Sensitive     * >=100,000 COLONIES/mL PROTEUS MIRABILIS  Culture, blood (single)     Status: None   Collection Time: 03/29/21  9:30 PM   Specimen: BLOOD  Result Value Ref Range Status   Specimen Description   Final    BLOOD RIGHT ANTECUBITAL Performed at [facility], [address] [place], [place] [address]    Special Requests   Final  BOTTLES DRAWN AEROBIC AND ANAEROBIC Blood Culture adequate volume Performed at [facility], [address], [place], [place] [address]    Culture   Final    NO GROWTH 5 DAYS Performed at [facility], [address] [place], [place] [address]    Report Status 04/03/2021 FINAL  Final  MRSA Next Gen by PCR, Nasal     Status: Abnormal   Collection Time: 03/30/21 12:00 AM   Specimen: Nasal Mucosa; Nasal Swab  Result Value Ref Range Status   MRSA by PCR Next Gen DETECTED (A) NOT DETECTED Final    Comment: RESULT CALLED TO, READ BACK BY AND VERIFIED WITH: KAREN, RN @ 0403 ON 03/30/21 C VARNER (NOTE) The GeneXpert MRSA Assay (FDA approved for NASAL specimens only), is one component of a comprehensive MRSA colonization surveillance program. It is not intended to diagnose MRSA infection nor to guide or monitor treatment for MRSA infections. Test performance is not FDA approved in patients less than 27 years old. Performed at [facility], [address] [place], [place] [address]   Culture, blood (x 2)     Status: None (Preliminary result)   Collection Time: 03/30/21  1:53 AM   Specimen: BLOOD  Result Value Ref Range Status   Specimen Description   Final    BLOOD BLOOD LEFT HAND Performed at [facility], [address] [place], [place] [address]    Special Requests   Final    BOTTLES DRAWN AEROBIC ONLY Blood Culture adequate volume Performed at [facility], [address], [place], [place] [address]    Culture   Final    NO GROWTH 4 DAYS Performed at [facility], [address], [place], [place] [address]    Report Status PENDING  Incomplete  Culture, blood (x 2)     Status: None (Preliminary result)   Collection Time: 03/30/21  3:11 AM   Specimen: BLOOD  Result Value Ref Range Status   Specimen Description   Final    BLOOD BLOOD LEFT HAND Performed at [facility], [address] [place], [place] [address]    Special Requests   Final    BOTTLES DRAWN AEROBIC ONLY Blood Culture adequate  volume Performed at [facility], [address], [place], [place] [address]    Culture   Final    NO GROWTH 4 DAYS Performed at [facility], [address], [place], [place] [address]    Report Status PENDING  Incomplete          Radiology Studies: CT HEAD WO CONTRAST ( )  Result Date: 04/02/2021 CLINICAL DATA:  TIA. Increased altered mental status and asymmetric pupils. EXAM: CT HEAD WITHOUT CONTRAST TECHNIQUE: Contiguous axial images were obtained from the base of the skull through the vertex without intravenous contrast. COMPARISON:  03/29/2021 FINDINGS: Brain: No evidence of acute infarction, hemorrhage, hydrocephalus, extra-axial collection or mass lesion/mass effect. Generalized atrophy. Vascular: No hyperdense vessel or unexpected calcification. Skull: Normal. Negative for fracture or focal lesion. Sinuses/Orbits: Patchy opacification of paranasal sinuses, overall mild. IMPRESSION: No acute or interval finding. Electronically Signed   By: Marnee Spring M.D.   On: 04/02/2021 09:53   MR BRAIN WO CONTRAST  Result Date: 04/02/2021 CLINICAL DATA:  Mental status change, unknown cause EXAM: MRI HEAD WITHOUT CONTRAST TECHNIQUE: Multiplanar, multiecho pulse sequences of the brain and surrounding structures were obtained without intravenous contrast. COMPARISON:  11/19/2020  FINDINGS: Brain: There is no acute infarction or intracranial hemorrhage. There is no intracranial mass, mass effect, or edema. There is no hydrocephalus or extra-axial fluid collection. Prominence of the ventricles and sulci reflects generalized parenchymal volume loss. Patchy T2 hyperintensity in the supratentorial white matter is nonspecific but may reflect minor chronic microvascular ischemic changes. Signal abnormality on the prior study has resolved. Vascular: Major vessel flow voids at the skull base are preserved. Skull and upper cervical spine: Normal marrow signal is preserved.  Sinuses/Orbits: Mild mucosal thickening.  Orbits are unremarkable. Other: Sella is unremarkable. Mild patchy mastoid fluid opacification. IMPRESSION: No acute infarction, hemorrhage, or mass. Electronically Signed   By: Guadlupe SpanishPraneil  Patel M.D.   On: 04/02/2021 18:36        Scheduled Meds:  apixaban  5 mg Oral BID   atorvastatin  10 mg Oral Daily   budesonide (PULMICORT) nebulizer solution  0.5 mg Nebulization BID   ferrous sulfate  325 mg Oral Daily   fluticasone  2 spray Each Nare Daily   insulin aspart  0-9 Units Subcutaneous TID WC   ipratropium  0.5 mg Nebulization Q6H   levalbuterol  0.63 mg Nebulization Q6H   loratadine  10 mg Oral Daily   mouth rinse  15 mL Mouth Rinse BID   methylPREDNISolone (SOLU-MEDROL) injection  40 mg Intravenous Daily   metoprolol tartrate  2.5 mg Intravenous Q8H   mupirocin ointment  1 application Nasal BID   pantoprazole (PROTONIX) IV  40 mg Intravenous Daily   polyethylene glycol  17 g Oral BID   senna-docusate  2 tablet Oral QHS   Continuous Infusions:  sodium chloride Stopped (04/03/21 0657)   sodium chloride Stopped (04/02/21 0137)    ceFAZolin (ANCEF) IV Stopped (04/03/21 0727)   valproate sodium Stopped (04/02/21 2238)     LOS: 5 days    Time spent: 40 minutes    Ramiro Harvestaniel Jamy Whyte, MD [facility]   To contact the attending provider between 7A-7P or the covering provider during after hours 7P-7A, please log into the web site [email or web address] and access using universal [facility] password for that web site. If you do not have the password, please call the hospital operator.  04/03/2021, 9:43 AM

## 2021-04-03 NOTE — Progress Notes (Signed)
Pt was off the BIPAP and on 3l [place]. RN asked if RT could wait to to assess her and give her breathing TX due to the Pt being restless.

## 2021-04-03 NOTE — Progress Notes (Signed)
eLink Physician-Brief Progress Note Patient Name: Jailene Cupit DOB: 02-14-1946 MRN: [id number]   Date of Service  04/03/2021  HPI/Events of Note  eICU to do list:  Camera: On nasal o2. A fib  RN discussion done. She called [facility] for a fib meds. Hemodynamically stable.  eICU Interventions  Got ativan, once afib settled, she is going on BiPAP. Asp precautions.      Intervention Category Minor Interventions: Routine modifications to care plan (e.g. PRN medications for pain, fever)  Ranee Gosselin 04/03/2021, 11:16 PM

## 2021-04-03 NOTE — Progress Notes (Signed)
  Speech Language Pathology Treatment: Dysphagia  Patient Details Name: Becky Hendricks MRN: [id number] DOB: 09-19-1945 Today's Date: 04/03/2021 Time: 9872-1587 SLP Time Calculation (min) (ACUTE ONLY): 25 min  Assessment / Plan / Recommendation Clinical Impression  Patient seen to address dysphagia goals with SLP observing her consuming breakfast meal tray (Dys 3 solids, thin liquids). After SLP assist with patient positioning in bed and setup of meal tray, she was able to feed self without difficulty. Oral and pharyngeal phase of swallow both appeared [facility] and patient did not exhibit any overt s/s aspiration or penetration. Mastication of dysphagia 3 solids was WFL-WNL; voice remained clear and strong. SLP recommending upgrade to Regular texture solids and no f/u required for swallow function.    HPI HPI: 75 y.o. female  admitted on 03/29/2021   75 year old female admitted from [facility] with 2 weeks history of progressive generalized weakness and with acute loss of consciousness and respiratory distress.  Patient admitted to critical care service at [facility] in [facility] for acute hypercapnic respiratory failure and severe sepsis.  Patient has underlying history of paroxysmal atrial fibrillation, bipolar disorder, hypertension diabetes and seizure disorder.  She has history of cardiac arrest in May 2021.  She was admitted in April 2022 for MRSA bacteremia, has history of chronic osteomyelitis of coccyx area. Intubation 03/29/21-03/31/21      SLP Plan  Discharge SLP treatment due to (comment) (goals met)       Recommendations  Diet recommendations: Thin liquid;Regular Liquids provided via: Straw;Cup Medication Administration: Whole meds with puree Supervision: Patient able to self feed;Intermittent supervision to cue for compensatory strategies Compensations: Slow rate;Small sips/bites;Follow solids with liquid Postural Changes and/or Swallow  Maneuvers: Seated upright 90 degrees                Oral Care Recommendations: Oral care BID Follow up Recommendations: None SLP Visit Diagnosis: Dysphagia, unspecified (R13.10) Plan: Discharge SLP treatment due to (comment) (goals met)       Sonia Baller, MA, CCC-SLP Speech Therapy

## 2021-04-04 DIAGNOSIS — F319 Bipolar disorder, unspecified: Secondary | ICD-10-CM | POA: Diagnosis not present

## 2021-04-04 DIAGNOSIS — R6520 Severe sepsis without septic shock: Secondary | ICD-10-CM | POA: Diagnosis not present

## 2021-04-04 DIAGNOSIS — J9622 Acute and chronic respiratory failure with hypercapnia: Secondary | ICD-10-CM | POA: Diagnosis not present

## 2021-04-04 DIAGNOSIS — J9601 Acute respiratory failure with hypoxia: Secondary | ICD-10-CM | POA: Diagnosis not present

## 2021-04-04 DIAGNOSIS — J9621 Acute and chronic respiratory failure with hypoxia: Secondary | ICD-10-CM | POA: Diagnosis not present

## 2021-04-04 DIAGNOSIS — I480 Paroxysmal atrial fibrillation: Secondary | ICD-10-CM | POA: Diagnosis not present

## 2021-04-04 DIAGNOSIS — G9341 Metabolic encephalopathy: Secondary | ICD-10-CM | POA: Diagnosis not present

## 2021-04-04 DIAGNOSIS — A419 Sepsis, unspecified organism: Secondary | ICD-10-CM | POA: Diagnosis not present

## 2021-04-04 LAB — TROPONIN I (HIGH SENSITIVITY): Troponin I (High Sensitivity): 9 ng/L (ref <18)

## 2021-04-04 LAB — CBC WITH DIFFERENTIAL/PLATELET
Abs Immature Granulocytes: 0.08 10*3/uL — ABNORMAL HIGH (ref 0.00–0.07)
Basophils Absolute: 0 10*3/uL (ref 0.0–0.1)
Basophils Relative: 0 %
Eosinophils Absolute: 0.2 10*3/uL (ref 0.0–0.5)
Eosinophils Relative: 2 %
HCT: 35.5 % — ABNORMAL LOW (ref 36.0–46.0)
Hemoglobin: 10.6 g/dL — ABNORMAL LOW (ref 12.0–15.0)
Immature Granulocytes: 1 %
Lymphocytes Relative: 19 %
Lymphs Abs: 1.9 10*3/uL (ref 0.7–4.0)
MCH: 27.8 pg (ref 26.0–34.0)
MCHC: 29.9 g/dL — ABNORMAL LOW (ref 30.0–36.0)
MCV: 93.2 fL (ref 80.0–100.0)
Monocytes Absolute: 1.6 10*3/uL — ABNORMAL HIGH (ref 0.1–1.0)
Monocytes Relative: 16 %
Neutro Abs: 6.2 10*3/uL (ref 1.7–7.7)
Neutrophils Relative %: 62 %
Platelets: 210 10*3/uL (ref 150–400)
RBC: 3.81 MIL/uL — ABNORMAL LOW (ref 3.87–5.11)
RDW: 15 % (ref 11.5–15.5)
WBC: 10 10*3/uL (ref 4.0–10.5)
nRBC: 0 % (ref 0.0–0.2)

## 2021-04-04 LAB — PHOSPHORUS: Phosphorus: 3.1 mg/dL (ref 2.5–4.6)

## 2021-04-04 LAB — CULTURE, BLOOD (ROUTINE X 2)
Culture: NO GROWTH
Culture: NO GROWTH
Special Requests: ADEQUATE
Special Requests: ADEQUATE

## 2021-04-04 LAB — GLUCOSE, CAPILLARY
Glucose-Capillary: 177 mg/dL — ABNORMAL HIGH (ref 70–99)
Glucose-Capillary: 114 mg/dL — ABNORMAL HIGH (ref 70–99)
Glucose-Capillary: 175 mg/dL — ABNORMAL HIGH (ref 70–99)

## 2021-04-04 LAB — BASIC METABOLIC PANEL
Anion gap: 7 (ref 5–15)
BUN: 23 mg/dL (ref 8–23)
CO2: 34 mmol/L — ABNORMAL HIGH (ref 22–32)
Calcium: 8.9 mg/dL (ref 8.9–10.3)
Chloride: 94 mmol/L — ABNORMAL LOW (ref 98–111)
Creatinine, Ser: 0.71 mg/dL (ref 0.44–1.00)
GFR, Estimated: >60 mL/min (ref >60)
Glucose, Bld: 193 mg/dL — ABNORMAL HIGH (ref 70–99)
Potassium: 4 mmol/L (ref 3.5–5.1)
Sodium: 135 mmol/L (ref 135–145)

## 2021-04-04 LAB — PROCALCITONIN: Procalcitonin: <0.1 ng/mL

## 2021-04-04 LAB — LACTIC ACID, PLASMA: Lactic Acid, Venous: 1.4 mmol/L (ref 0.5–1.9)

## 2021-04-04 LAB — MAGNESIUM: Magnesium: 2 mg/dL (ref 1.7–2.4)

## 2021-04-04 MED ORDER — LEVALBUTEROL HCL 0.63 MG/3ML IN NEBU: 0.6300 mg | INHALATION_SOLUTION | Freq: Four times a day (QID) | RESPIRATORY_TRACT | Status: DC | PRN

## 2021-04-04 MED ORDER — RISPERIDONE 0.25 MG PO TABS: 0.5000 mg | ORAL_TABLET | Freq: Two times a day (BID) | ORAL | Status: DC

## 2021-04-04 MED ORDER — LEVALBUTEROL HCL 0.63 MG/3ML IN NEBU
0.6300 mg | INHALATION_SOLUTION | Freq: Three times a day (TID) | RESPIRATORY_TRACT | Status: DC
  Filled 2021-04-04: qty 3

## 2021-04-04 MED ORDER — RISPERIDONE 1 MG PO TABS
1.0000 mg | ORAL_TABLET | Freq: Two times a day (BID) | ORAL | Status: DC
  Administered 2021-04-04 – 2021-04-07 (×6): 1 mg via ORAL
  Filled 2021-04-04 (×6): qty 1

## 2021-04-04 MED ORDER — FUROSEMIDE 10 MG/ML IJ SOLN
20.0000 mg | Freq: Once | INTRAMUSCULAR | Status: DC
  Filled 2021-04-04: qty 2

## 2021-04-04 MED ORDER — IPRATROPIUM BROMIDE 0.02 % IN SOLN
0.5000 mg | Freq: Three times a day (TID) | RESPIRATORY_TRACT | Status: DC
  Filled 2021-04-04: qty 2.5

## 2021-04-04 NOTE — Progress Notes (Signed)
PROGRESS NOTE    Becky Hendricks  ZOX:[id number] DOB: 26-Jul-1946 DOA: 03/29/2021 PCP: Roderic Scarce, MD    Chief Complaint  Patient presents with   Fatigue   Altered Mental Status   Hypoxia    Brief Narrative:  75y/o female that had a 2 week hx of progressive generalized weakness presented to the ER 8/17 for [facility] and resp distress.  The pt presented from [facility].  Once in the ER the pt was found to have a ph of 7.084 and required emergent intubation. The pt had a similar presentation in April of this year and was found to have MRSA bacteriemia at that time.  Concern for PNA, UTI on admit as source of sepsis.    Assessment & Plan:   Principal Problem:   Severe sepsis (HCC) Active Problems:   Metabolic encephalopathy   Urinary tract infection without hematuria   AF (paroxysmal atrial fibrillation) (HCC)   Sacral wound   Bipolar disorder (HCC)   Type 2 diabetes mellitus with hyperglycemia, with long-term current use of insulin (HCC)   Encephalopathy   Acute respiratory failure with hypoxia (HCC)   Hyponatremia   Functional quadriplegia (HCC)   Pneumonia of lower lobe due to infectious organism   Colitis   Acute respiratory failure (HCC)   Acute on chronic respiratory failure with hypoxia and hypercapnia (HCC)   1 acute hypoxic hypercarbic respiratory failure/pneumonia, POA -Patient was intubated on admission and placed empirically on IV antibiotics. -Patient extubated currently on 2L nasal cannula. -Patient lethargic the morning of 04/02/2021 and was not following commands.  -CT head and MRI head negative for any acute abnormalities. -ABG with a respiratory acidosis. -IV antibiotics narrowed down to Rocephin and subsequently to IV cefazolin.   -Concern for possible undiagnosed OSA.   -Patient tolerated BiPAP overnight.   -PCCM recommending BiPAP nightly versus dream station nightly and outpatient follow-up for OSA evaluation. --2D echo with EF 60 to 65%,NWMA,  right ventricle poorly visualized but grossly normal.  Moderately elevated pulmonary artery systolic pressure. -Lasix IV x1 ordered per PCCM. -Continue current nebs, IV steroid taper. -PCCM following and appreciate input and recommendations.  2.  Severe sepsis with septic shock secondary to pneumonia/Proteus Mirabilis UTI, POA -Currently afebrile.  Patient initially improving. -Patient lethargic the morning of 04/02/2021 which has subsequently improved.  See #3.  -Currently not on pressors. -Urine cultures with > 100,000 colonies of Proteus Mirabilis -Blood cultures pending with no growth to date. -Fever curve trending down. -Urine Legionella antigen negative, urine pneumococcus antigen negative. -IV antibiotics narrowed down to IV Rocephin and subsequently narrowed down to IV cefazolin.   -Continue IV antibiotics through tomorrow to complete a 7-day course of treatment.   -Follow.   3.  Acute metabolic encephalopathy/seizure disorder/bipolar disorder -Acute metabolic encephalopathy initially felt likely secondary to sepsis infection. -Patient with no noted seizures early on during the hospitalization however on 04/02/2021 patient became significantly lethargic and concern for possible postictal state. -Head CT and MRI head done were negative for any acute abnormalities.   -Limited Ceribell EEG suggestive of moderate to severe diffuse encephalopathy, nonspecific, no seizures or definite epileptiform discharges were seen throughout the recording. -Ammonia level within normal limits.  -ABG obtained with a respiratory acidosis. -Patient was placed on BiPAP.   -Per RN patient suddenly woke up and more alert around 5 PM on 04/02/2021.  ??  Supratentorial vs catatonia vs secondary to effect from risperidone which was discontinued. -Solu-Medrol decreased to 40 mg IV daily per  PCCM who are recommending taper.  -Continue Pulmicort, Xopenex and Atrovent nebs, PPI, Flonase. -Continue empiric IV  antibiotics and discontinue after tomorrow's doses are given to complete a 7-day course of treatment. -IV Depakote has been changed to oral Depakote as mentation improved.   -Risperidone started at a lower dose.   -PCCM following.   -Psych consultation placed.    4.  Paroxysmal A. fib/bradycardia -Patient noted to have briefly gone into a bout of A. fib the evening of 04/01/2021, given a dose of IV Lopressor and started back on home dose oral Coreg with rate control. -Patient back in A. fib with controlled rate. -Patient noted to be lethargic 04/02/2021 and as such placed on scheduled IV Lopressor and Coreg discontinued. -Eliquis for anticoagulation. -Mental status improved and patient placed back on oral Coreg. -Follow.  5.  Diabetes mellitus type 2 -Hemoglobin A1c 7.0  -CBG 177 this morning.   -Steroid dose decreased.   -SSI.   6.  Deconditioned -PT/OT. -Will need SNF placement.  7.  Constipation -Continue current bowel regimen of MiraLAX twice daily, Senokot-S daily.  8.  Bipolar disorder -Patient noted to be belligerent with staff. -Risperidone was held due to acute encephalopathy the other day which seem to have improved after Risperdal was discontinued. -[facility] at a lower dose this [facility].         DVT prophylaxis: Eliquis Code Status: Full Family Communication: Updated patient.  No family at bedside. Disposition:   Status is: Inpatient  Remains inpatient appropriate because:Inpatient level of care appropriate due to severity of illness  Dispo: The patient is from: SNF              Anticipated d/c is to: SNF              Patient currently is not medically stable to d/c.   Difficult to place patient No       Consultants:  PCCM  Procedures:  Chest x-ray 04/01/2021, 03/30/2021, 03/29/2021 CT abdomen and pelvis 03/29/2021 CT head 03/29/2021 Abdominal films 03/29/2021 CT head  04/02/2021 MRI brain  04/02/2021 Rapid EEG/limited ceribell EEG 04/02/2021 2D echo 04/03/2021  Significant Hospital Events: Including procedures, antibiotic start and stop dates in addition to other pertinent events   8/17 Admit with ALOC, weakness, respiratory distress.  CT head negative for acute intracranial process, possible sinusitis, CT abd/pelvis with mild bilateral posterior subsegmental atelectasis + pleural effusions, large amt of stool in distal sigmoid colon 8/19 Required reintubation as pt was able to bite pilot balloon in half. Extubated later in am.     Antimicrobials:  IV azithromycin 8/18/ 2022x1 IV Rocephin 03/30/2021>>>>> IV Ancef 04/02/2021>>>> 04/05/2021   Subjective: Patient sleepy/drowsy.  Arousable.  Denies any chest pain.  No shortness of breath.  No abdominal pain.   Patient noted to have been belligerent to staff yesterday.  Tolerated BiPAP overnight. Off BiPAP.     Objective: Vitals:   04/04/21 0500 04/04/21 0600 04/04/21 0700 04/04/21 0800  BP: 105/67 106/77 (!) 117/49 140/76  Pulse: (!) 109 (!) 42 (!) 59 81  Resp: (!) 27 (!) 26 (!) 26 19  Temp:    97.9 F (36.6 C)  TempSrc:    Oral  SpO2: 98% 96% 100% 100%  Weight: 108 kg     Height:        Intake/Output Summary (Last 24 hours) at 04/04/2021 1110 Last data filed at 04/04/2021 0852 Gross per 24 hour  Intake 1037.35 ml  Output 400 ml  Net 637.35 ml    Filed Weights   04/01/21 0335 04/02/21 0500 04/04/21 0500  Weight: 107.7 kg 107.1 kg 108 kg    Examination:  General exam: Sleeping.  Arousable Respiratory system: Clear to auscultation bilaterally anterior lung fields.  Some decreased breath sounds in the bases.  No rhonchi.  No crackles.  Normal respiratory effort.  Cardiovascular system: Regular rate rhythm no murmurs rubs or gallops.  No JVD.  1+ bilateral lower extremity edema. Gastrointestinal system: Abdomen is soft, nontender, nondistended, positive bowel sounds.  No rebound.  No guarding.   Central nervous  system: Sleeping.  Moving extremities spontaneously.  Extremities: Symmetric 5 x 5 power. Skin: No rashes, lesions or ulcers Psychiatry: Judgement and insight poor to fair.  Mood and affect appropriate.    Data Reviewed: I have personally reviewed following labs and imaging studies  CBC: Recent Labs  Lab 03/29/21 1614 03/30/21 0311 04/01/21 0242 04/02/21 0304 04/03/21 0311 04/04/21 0553  WBC 12.7* 8.3 10.3 10.7* 6.8 10.0  NEUTROABS 7.7 6.2  --   --  5.4 6.2  HGB 12.2 10.8* 11.6* 10.6* 10.5* 10.6*  HCT 39.1 34.6* 38.9 35.9* 34.8* 35.5*  MCV 91.8 91.3 93.5 94.2 92.8 93.2  PLT 193 169 192 191 203 210     Basic Metabolic Panel: Recent Labs  Lab 03/29/21 2130 03/30/21 0311 03/31/21 0308 04/01/21 0242 04/02/21 0304 04/03/21 0311 04/03/21 1333 04/04/21 0553  NA  --  131*  --  138 135 134*  --  135  K  --  4.9  --  3.8 3.9 4.4  --  4.0  CL  --  91*  --  101 96* 96*  --  94*  CO2  --  30  --  29 32 31  --  34*  GLUCOSE  --  129*  --  115* 117* 291*  --  193*  BUN  --  22  --  14 11 14   --  23  CREATININE  --  0.53 0.69 0.47 0.50 0.52  --  0.71  CALCIUM  --  8.3*  --  8.2* 8.2* 8.5*  --  8.9  MG 2.0 1.8  --  1.9  --   --  2.2 2.0  PHOS 6.3* 4.3  --   --   --   --  2.4* 3.1     GFR: Estimated Creatinine Clearance: 77.9 mL/min (by C-G formula based on SCr of 0.71 mg/dL).  Liver Function Tests: Recent Labs  Lab 03/29/21 1614 03/30/21 0311  AST 9* 12*  ALT 9 9  ALKPHOS 39 35*  BILITOT 0.4 0.4  PROT 6.3* 5.4*  ALBUMIN 3.1* 2.6*     CBG: Recent Labs  Lab 04/02/21 1650 04/03/21 0806 04/03/21 1257 04/03/21 1739 04/04/21 0826  GLUCAP 146* 165* 274* 223* 177*      Recent Results (from the past 240 hour(s))  Resp Panel by RT-PCR (Flu A&B, Covid) Nasopharyngeal Swab     Status: None   Collection Time: 03/29/21  4:12 PM   Specimen: Nasopharyngeal Swab; Nasopharyngeal(NP) swabs in vial transport medium  Result Value Ref Range Status   SARS Coronavirus 2  by RT PCR NEGATIVE NEGATIVE Final    Comment: (NOTE) SARS-CoV-2 target nucleic acids are NOT DETECTED.  The SARS-CoV-2 RNA is generally detectable in upper respiratory specimens during the acute phase of infection. The lowest concentration of SARS-CoV-2 viral copies this assay can detect is 138 copies/mL. A negative result  does not preclude SARS-Cov-2 infection and should not be used as the sole basis for treatment or other patient management decisions. A negative result may occur with  improper specimen collection/handling, submission of specimen other than nasopharyngeal swab, presence of viral mutation(s) within the areas targeted by this assay, and inadequate number of viral copies(<138 copies/mL). A negative result must be combined with clinical observations, patient history, and epidemiological information. The expected result is Negative.  Fact Sheet for Patients:  BloggerCourse.com  Fact Sheet for Healthcare Providers:  SeriousBroker.it  This test is no t yet approved or cleared by the [place] FDA and  has been authorized for detection and/or diagnosis of SARS-CoV-2 by FDA under an Emergency Use Authorization (EUA). This EUA will remain  in effect (meaning this test can be used) for the duration of the COVID-19 declaration under Section 564(b)(1) of the Act, 21 U.S.C.section 360bbb-3(b)(1), unless the authorization is terminated  or revoked sooner.       Influenza A by PCR NEGATIVE NEGATIVE Final   Influenza B by PCR NEGATIVE NEGATIVE Final    Comment: (NOTE) The Xpert Xpress SARS-CoV-2/FLU/RSV plus assay is intended as an aid in the diagnosis of influenza from Nasopharyngeal swab specimens and should not be used as a sole basis for treatment. Nasal washings and aspirates are unacceptable for Xpert Xpress SARS-CoV-2/FLU/RSV testing.  Fact Sheet for Patients: BloggerCourse.com  Fact  Sheet for Healthcare Providers: SeriousBroker.it  This test is not yet approved or cleared by the [place] FDA and has been authorized for detection and/or diagnosis of SARS-CoV-2 by FDA under an Emergency Use Authorization (EUA). This EUA will remain in effect (meaning this test can be used) for the duration of the COVID-19 declaration under Section 564(b)(1) of the Act, 21 U.S.C. section 360bbb-3(b)(1), unless the authorization is terminated or revoked.  Performed at [facility], [address], [place], [place] [address]   Culture, blood (single)     Status: None   Collection Time: 03/29/21  4:46 PM   Specimen: BLOOD  Result Value Ref Range Status   Specimen Description   Final    BLOOD BLOOD RIGHT FOREARM Performed at [facility], [address], [place], [place] [address]    Special Requests   Final    BOTTLES DRAWN AEROBIC AND ANAEROBIC Blood Culture adequate volume Performed at [facility], [address] [place], [place] [address]    Culture   Final    NO GROWTH 5 DAYS Performed at [facility], [address], [place], [place] [address]    Report Status 04/03/2021 FINAL  Final  Urine Culture     Status: Abnormal   Collection Time: 03/29/21  8:46 PM   Specimen: Urine, Clean Catch  Result Value Ref Range Status   Specimen Description   Final    URINE, CLEAN CATCH Performed at [facility], [address] [place], [place] [address]    Special Requests   Final    NONE Performed at [facility], [address], [place], [place] [address]    Culture >=100,000 COLONIES/mL PROTEUS MIRABILIS (A)  Final   Report Status 04/01/2021 FINAL  Final   Organism ID, Bacteria PROTEUS MIRABILIS (A)  Final      Susceptibility   Proteus mirabilis - MIC*    AMPICILLIN <=2 SENSITIVE Sensitive     CEFAZOLIN <=4 SENSITIVE Sensitive     CEFEPIME <=0.12 SENSITIVE  Sensitive  CEFTRIAXONE <=0.25 SENSITIVE Sensitive     CIPROFLOXACIN <=0.25 SENSITIVE Sensitive     GENTAMICIN <=1 SENSITIVE Sensitive     IMIPENEM 1 SENSITIVE Sensitive     NITROFURANTOIN 128 RESISTANT Resistant     TRIMETH/SULFA <=20 SENSITIVE Sensitive     AMPICILLIN/SULBACTAM <=2 SENSITIVE Sensitive     PIP/TAZO <=4 SENSITIVE Sensitive     * >=100,000 COLONIES/mL PROTEUS MIRABILIS  Culture, blood (single)     Status: None   Collection Time: 03/29/21  9:30 PM   Specimen: BLOOD  Result Value Ref Range Status   Specimen Description   Final    BLOOD RIGHT ANTECUBITAL Performed at [facility], [address], [place], [place] [address]    Special Requests   Final    BOTTLES DRAWN AEROBIC AND ANAEROBIC Blood Culture adequate volume Performed at [facility], [address] [place], [place] [address]    Culture   Final    NO GROWTH 5 DAYS Performed at [facility], [address], [place], [place] [address]    Report Status 04/03/2021 FINAL  Final  MRSA Next Gen by PCR, Nasal     Status: Abnormal   Collection Time: 03/30/21 12:00 AM   Specimen: Nasal Mucosa; Nasal Swab  Result Value Ref Range Status   MRSA by PCR Next Gen DETECTED (A) NOT DETECTED Final    Comment: RESULT CALLED TO, READ BACK BY AND VERIFIED WITH: KAREN, RN @ 0403 ON 03/30/21 C VARNER (NOTE) The GeneXpert MRSA Assay (FDA approved for NASAL specimens only), is one component of a comprehensive MRSA colonization surveillance program. It is not intended to diagnose MRSA infection nor to guide or monitor treatment for MRSA infections. Test performance is not FDA approved in patients less than 16 years old. Performed at [facility], [address] [place], [place] [address]   Culture, blood (x 2)     Status: None   Collection Time: 03/30/21  1:53 AM   Specimen: BLOOD  Result Value Ref Range Status   Specimen Description   Final    BLOOD BLOOD  LEFT HAND Performed at [facility], [address], [place], [place] [address]    Special Requests   Final    BOTTLES DRAWN AEROBIC ONLY Blood Culture adequate volume Performed at [facility], [address] [place], [place] [address]    Culture   Final    NO GROWTH 5 DAYS Performed at [facility], [address], [place], [place] [address]    Report Status 04/04/2021 FINAL  Final  Culture, blood (x 2)     Status: None   Collection Time: 03/30/21  3:11 AM   Specimen: BLOOD  Result Value Ref Range Status   Specimen Description   Final    BLOOD BLOOD LEFT HAND Performed at [facility], [address] [place], [place] [address]    Special Requests   Final    BOTTLES DRAWN AEROBIC ONLY Blood Culture adequate volume Performed at [facility], [address] [place], [place] [address]    Culture   Final    NO GROWTH 5 DAYS Performed at [facility], [address], [place], [place] [address]    Report Status 04/04/2021 FINAL  Final          Radiology Studies: MR BRAIN WO CONTRAST  Result Date: 04/02/2021 CLINICAL DATA:  Mental status change, unknown cause EXAM:  MRI HEAD WITHOUT CONTRAST TECHNIQUE: Multiplanar, multiecho pulse sequences of the brain and surrounding structures were obtained without intravenous contrast. COMPARISON:  11/19/2020 FINDINGS: Brain: There is no acute infarction or intracranial hemorrhage. There is no intracranial mass, mass effect, or edema. There is no hydrocephalus or extra-axial fluid collection. Prominence of the ventricles and sulci reflects generalized parenchymal volume loss. Patchy T2 hyperintensity in the supratentorial white matter is nonspecific but may reflect minor chronic microvascular ischemic changes. Signal abnormality on the prior study has resolved. Vascular: Major vessel flow voids at the skull base are preserved. Skull and upper cervical spine:  Normal marrow signal is preserved. Sinuses/Orbits: Mild mucosal thickening.  Orbits are unremarkable. Other: Sella is unremarkable. Mild patchy mastoid fluid opacification. IMPRESSION: No acute infarction, hemorrhage, or mass. Electronically Signed   By: Guadlupe Spanish M.D.   On: 04/02/2021 18:36   ECHOCARDIOGRAM COMPLETE  Result Date: 04/03/2021    ECHOCARDIOGRAM REPORT   Patient Name:   LIANY MUMPOWER Date of Exam: 04/03/2021 Medical Rec #:  [id number]      Height:       66.0 in Accession #:    [id number]     Weight:       236.1 lb Date of Birth:  03/15/1946       BSA:          2.146 m Patient Age:    75 years       BP:           156/63 mmHg Patient Gender: F              HR:           70 bpm. Exam Location:  Inpatient Procedure: 2D Echo, Cardiac Doppler and Color Doppler Indications:    CHF  History:        Patient has prior history of Echocardiogram examinations, most                 recent 11/19/2020. Arrythmias:Atrial Fibrillation and Cardiac                 Arrest; Risk Factors:Diabetes and Dyslipidemia.  Sonographer:    Neomia Dear RDCS Referring Phys: 89 MURALI RAMASWAMY  Sonographer Comments: Technically challenging study due to limited acoustic windows and patient is morbidly obese. Image acquisition challenging due to patient body habitus. IMPRESSIONS  1. Technically difficult study. Left ventricular ejection fraction, by estimation, is 60 to 65%. The left ventricle has normal function. The left ventricle has no regional wall motion abnormalities. There is moderate asymmetric left ventricular hypertrophy of the basal-septal segment.     Left ventricular diastolic parameters are consistent with Grade II diastolic dysfunction (pseudonormalization). Elevated left atrial pressure.  2. Right ventricule is poorly visualized but grossly normal size and systolic function. There is moderately elevated pulmonary artery systolic pressure. The estimated right ventricular systolic pressure is 47.7 mmHg.  3. The  mitral valve is normal in structure. No evidence of mitral valve regurgitation.  4. The aortic valve is tricuspid. Aortic valve regurgitation is not visualized. No aortic stenosis is present. FINDINGS  Left Ventricle: Left ventricular ejection fraction, by estimation, is 60 to 65%. The left ventricle has normal function. The left ventricle has no regional wall motion abnormalities. The left ventricular internal cavity size was normal in size. There is  moderate asymmetric left ventricular hypertrophy of the basal-septal segment. Left ventricular diastolic parameters are consistent with Grade II diastolic dysfunction (pseudonormalization). Elevated left atrial pressure. Right Ventricle: The right  ventricular size is normal. Right vetricular wall thickness was not well visualized. Right ventricular systolic function is normal. There is moderately elevated pulmonary artery systolic pressure. The tricuspid regurgitant velocity is 3.15 m/s, and with an assumed right atrial pressure of 8 mmHg, the estimated right ventricular systolic pressure is 47.7 mmHg. Left Atrium: Left atrial size was normal in size. Right Atrium: Right atrial size was normal in size. Pericardium: There is no evidence of pericardial effusion. Presence of pericardial fat pad. Mitral Valve: The mitral valve is normal in structure. No evidence of mitral valve regurgitation. Tricuspid Valve: The tricuspid valve is normal in structure. Tricuspid valve regurgitation is trivial. Aortic Valve: The aortic valve is tricuspid. Aortic valve regurgitation is not visualized. No aortic stenosis is present. Aortic valve mean gradient measures 5.0 mmHg. Aortic valve peak gradient measures 10.0 mmHg. Aortic valve area, by VTI measures 1.79  cm. Pulmonic Valve: The pulmonic valve was not well visualized. Pulmonic valve regurgitation is trivial. Aorta: The aortic root and ascending aorta are structurally normal, with no evidence of dilitation. IAS/Shunts: The  interatrial septum was not well visualized.  LEFT VENTRICLE PLAX 2D LVIDd:         4.50 cm     Diastology LVIDs:         3.30 cm     LV e' medial:    4.90 cm/s LV PW:         1.60 cm     LV E/e' medial:  20.8 LV IVS:        1.50 cm     LV e' lateral:   7.62 cm/s LVOT diam:     1.80 cm     LV E/e' lateral: 13.4 LV SV:         67 LV SV Index:   31 LVOT Area:     2.54 cm  LV Volumes (MOD) LV vol d, MOD A4C: 64.0 ml LV vol s, MOD A4C: 21.2 ml LV SV MOD A4C:     64.0 ml RIGHT VENTRICLE RV Basal diam:  3.80 cm RV Mid diam:    2.00 cm RV S prime:     16.00 cm/s TAPSE (M-mode): 2.6 cm LEFT ATRIUM           Index       RIGHT ATRIUM           Index LA diam:      3.80 cm 1.77 cm/m  RA Area:     14.80 cm LA Vol (A4C): 63.9 ml 29.78 ml/m RA Volume:   34.90 ml  16.26 ml/m  AORTIC VALVE                    PULMONIC VALVE AV Area (Vmax):    1.72 cm     PV Vmax:       0.98 m/s AV Area (Vmean):   1.68 cm     PV Vmean:      66.000 cm/s AV Area (VTI):     1.79 cm     PV VTI:        0.230 m AV Vmax:           158.00 cm/s  PV Peak grad:  3.9 mmHg AV Vmean:          106.000 cm/s PV Mean grad:  2.0 mmHg AV VTI:            0.376 m AV Peak Grad:      10.0 mmHg AV  Mean Grad:      5.0 mmHg LVOT Vmax:         107.00 cm/s LVOT Vmean:        70.000 cm/s LVOT VTI:          0.264 m LVOT/AV VTI ratio: 0.70  AORTA Ao Root diam: 2.70 cm Ao Asc diam:  3.60 cm MITRAL VALVE                TRICUSPID VALVE MV Area (PHT): 4.57 cm     TR Peak grad:   39.7 mmHg MV Decel Time: 166 msec     TR Vmax:        315.00 cm/s MV E velocity: 102.00 cm/s MV A velocity: 123.00 cm/s  SHUNTS MV E/A ratio:  0.83         Systemic VTI:  0.26 m                             Systemic Diam: 1.80 cm Epifanio Lesches MD Electronically signed by Epifanio Lesches MD Signature Date/Time: 04/03/2021/5:53:41 PM    Final         Scheduled Meds:  apixaban  5 mg Oral BID   atorvastatin  10 mg Oral Daily   budesonide (PULMICORT) nebulizer solution  0.5 mg Nebulization  BID   carvedilol  6.25 mg Oral BID WC   divalproex  500 mg Oral Q12H   ferrous sulfate  325 mg Oral Daily   fluticasone  2 spray Each Nare Daily   furosemide  20 mg Intravenous Once   insulin aspart  0-9 Units Subcutaneous TID WC   ipratropium  0.5 mg Nebulization Q6H   levalbuterol  0.63 mg Nebulization Q6H   loratadine  10 mg Oral Daily   mouth rinse  15 mL Mouth Rinse BID   methylPREDNISolone (SOLU-MEDROL) injection  40 mg Intravenous Daily   pantoprazole  40 mg Oral Daily   phosphorus  250 mg Oral BID   polyethylene glycol  17 g Oral BID   risperiDONE  1 mg Oral BID   senna-docusate  2 tablet Oral QHS   Continuous Infusions:  sodium chloride 10 mL/hr at 04/04/21 0852   sodium chloride Stopped (04/02/21 0137)    ceFAZolin (ANCEF) IV Stopped (04/04/21 0646)     LOS: 6 days    Time spent: 40 minutes    Ramiro Harvest, MD [facility]   To contact the attending provider between 7A-7P or the covering provider during after hours 7P-7A, please log into the web site [email or web address] and access using universal  password for that web site. If you do not have the password, please call the hospital operator.  04/04/2021, 11:10 AM

## 2021-04-04 NOTE — Consult Note (Signed)
Patient is seen and attempted to assess. She was sedated, and although she would arouse with physical stimulation (shaking) she should would go back to sleep. Psych consult was placed for aggressive behaviors, and demeaning language to staff caring for her. Chart review shows Agitated throughout yesterday afternoon, overnight, this morning.  Cursing at staff, shouting demands constantly, demanding constant nursing presence and assistance in room.  Patient is well known to [place] after an extended admission in April 2022. She was admitted with encephalopathy and sepsis. Over the 15 day admission patient cognitive impairment did lag behind her clinical symptoms. She subsequently responded to Depakote 500mg  po BID, and Risperdal 2mg  po BID. She was discharged to a SNF.  Considering she responded to the appropriate medications at that time, will slowly titrate her medications.   Please understand patient is at risk for developing delirium, in addition to exacerbation of her mood disorder symptoms.   -Recommend initiating delirium precautions. -Will increase Risperdal 1mg  po BID, and continue Depkaote 500mg  po BID. Depalote level obtained 08/17 (53).  -Psychiatry will continue to follow until patient stabilizes.

## 2021-04-04 NOTE — Progress Notes (Signed)
NAME:  Becky Hendricks, MRN:  [id number], DOB:  08-21-1945, LOS: 6 ADMISSION DATE:  03/29/2021, CONSULTATION DATE:  03/30/21 REFERRING MD:  ER , CHIEF COMPLAINT:  ALOC   Brief History:  75y/o female that had a 2 week hx of progressive generalized weakness presented to the ER 8/17 for [facility] and resp distress.  The pt presented from [facility].  Once in the ER the pt was found to have a ph of 7.084 and required emergent intubation. The pt had a similar presentation in April of this year and was found to have MRSA bacteriemia at that time.  Concern for PNA, UTI on admit as source of sepsis.   Pertinent  Medical History  HTN DM Afib Obesity  HFpF Seizure disorder Bipolar Disorder   Significant Hospital Events: Including procedures, antibiotic start and stop dates in addition to other pertinent events   8/17 Admit with ALOC, weakness, respiratory distress.  CT head negative for acute intracranial process, possible sinusitis, CT abd/pelvis with mild bilateral posterior subsegmental atelectasis + pleural effusions, large amt of stool in distal sigmoid colon 8/19 Required reintubation as pt was able to bite pilot balloon in half. Extubated later in am.  8/21 PCCM reconsulted for encephalopathy   Subjective   Agitated throughout yesterday afternoon, overnight, this morning.  Cursing at staff, shouting demands constantly, demanding constant nursing presence and assistance in room.  Wore BiPAP last night but this morning tells me "you put that shit on me without any permission and you are putting patients through things that you should not".  Tells staff that she will tell her brother about [place] doing things to her without permission and "he will turn you into minced meat"   Objective   Blood pressure 106/77, pulse (!) 42, temperature (!) 97.5 F (36.4 C), temperature source Oral, resp. rate (!) 26, height 5\' 6"  (1.676 m), weight 108 kg, SpO2 96 %.    Vent Mode: BIPAP;PCV FiO2 (%):  [30 %]  30 % Set Rate:  [12 bmp] 12 bmp PEEP:  [6 cmH20] 6 cmH20   Intake/Output Summary (Last 24 hours) at 04/04/2021 0756 Last data filed at 04/04/2021 0030 Gross per 24 hour  Intake 1408.72 ml  Output 400 ml  Net 1008.72 ml    Filed Weights   04/01/21 0335 04/02/21 0500 04/04/21 0500  Weight: 107.7 kg 107.1 kg 108 kg    Examination: General: Adult female, resting in bed, agitated, shouting random demands. Neuro: A&O x 3, no deficits.  MAE's. HEENT: [place]/AT. Sclerae anicteric. Cardiovascular: RRR, no M/R/G.  Lungs: Respirations even and unlabored.  CTA bilaterally, No W/R/R. Abdomen: Obese. BS x 4, soft, NT/ND.  Musculoskeletal: No gross deformities, no edema.  Skin: Intact, warm, no rashes.   Assessment & Plan:   Toxic Metabolic Encephalopathy: Initially in the setting of severe sepsis with significant hypercarbia.  This improved over time.  Acute change in mental status 8/21 AM.  ABG with pH 7.29 with PCO2 in the 60s. CT head negative.  EEG with diffuse encephalopathy.  Question that she had polypharmacy and medication effect with Risperdal (this was held 8/21 and exam 8/22 does seem much improved).  Also on moderately dosed steroids (90mg  Solumedrol BID which could be playing a part).  She continues to exhibit aggressive behavior towards staff and of note she does carry a hx of Bipolar disorder. - Would try resuming home Risperdal though at reduced dose (0.5mg  BID versus prescribed dose of 2mg  BID). - Have placed a  psychiatry consultation for further guidance on appropriate medical therapies. - D/c solumedrol.  Probable OSA / OHS. - Tolerated BiPAP overnight but adamantly refused to wear further this morning stating "you don't put that shit on me, I do not approve". - Would continue to encourage use of QHS BiPAP followed by outpatient sleep study.   Stable from our standpoint.  Transfer out of Progressive and continue care per [facility].  Await psych recs.   Best Practice (right click  and "Reselect all SmartList Selections" daily)  Per primary   Rutherford Guys, PA - [facility] For pager details, please see AMION or use Epic chat  After 1900, please call [facility] for cross coverage needs 04/04/2021, 7:56 AM

## 2021-04-04 NOTE — Progress Notes (Signed)
RT NOTE:  Pt adamantly refusing breathing treatments, states "I am not taking any breathing treatments that were ordered by that dumb doctor." Pt also states that she did not want to wear the BiPAP machine anymore.

## 2021-04-05 DIAGNOSIS — A419 Sepsis, unspecified organism: Secondary | ICD-10-CM | POA: Diagnosis not present

## 2021-04-05 DIAGNOSIS — R6520 Severe sepsis without septic shock: Secondary | ICD-10-CM | POA: Diagnosis not present

## 2021-04-05 DIAGNOSIS — F319 Bipolar disorder, unspecified: Secondary | ICD-10-CM

## 2021-04-05 LAB — GLUCOSE, CAPILLARY
Glucose-Capillary: 118 mg/dL — ABNORMAL HIGH (ref 70–99)
Glucose-Capillary: 177 mg/dL — ABNORMAL HIGH (ref 70–99)
Glucose-Capillary: 229 mg/dL — ABNORMAL HIGH (ref 70–99)

## 2021-04-05 LAB — CBC
HCT: 33.8 % — ABNORMAL LOW (ref 36.0–46.0)
Hemoglobin: 10.3 g/dL — ABNORMAL LOW (ref 12.0–15.0)
MCH: 28.4 pg (ref 26.0–34.0)
MCHC: 30.5 g/dL (ref 30.0–36.0)
MCV: 93.1 fL (ref 80.0–100.0)
Platelets: 209 10*3/uL (ref 150–400)
RBC: 3.63 MIL/uL — ABNORMAL LOW (ref 3.87–5.11)
RDW: 15 % (ref 11.5–15.5)
WBC: 20.2 10*3/uL — ABNORMAL HIGH (ref 4.0–10.5)
nRBC: 0 % (ref 0.0–0.2)

## 2021-04-05 LAB — COMPREHENSIVE METABOLIC PANEL
ALT: 8 U/L (ref 0–44)
AST: 9 U/L — ABNORMAL LOW (ref 15–41)
Albumin: 2.3 g/dL — ABNORMAL LOW (ref 3.5–5.0)
Alkaline Phosphatase: 27 U/L — ABNORMAL LOW (ref 38–126)
Anion gap: 5 (ref 5–15)
BUN: 26 mg/dL — ABNORMAL HIGH (ref 8–23)
CO2: 35 mmol/L — ABNORMAL HIGH (ref 22–32)
Calcium: 8.5 mg/dL — ABNORMAL LOW (ref 8.9–10.3)
Chloride: 95 mmol/L — ABNORMAL LOW (ref 98–111)
Creatinine, Ser: 0.66 mg/dL (ref 0.44–1.00)
GFR, Estimated: >60 mL/min (ref >60)
Glucose, Bld: 179 mg/dL — ABNORMAL HIGH (ref 70–99)
Potassium: 3.6 mmol/L (ref 3.5–5.1)
Sodium: 135 mmol/L (ref 135–145)
Total Bilirubin: 0.4 mg/dL (ref 0.3–1.2)
Total Protein: 5 g/dL — ABNORMAL LOW (ref 6.5–8.1)

## 2021-04-05 LAB — PHOSPHORUS: Phosphorus: 2.4 mg/dL — ABNORMAL LOW (ref 2.5–4.6)

## 2021-04-05 LAB — PROCALCITONIN: Procalcitonin: <0.1 ng/mL

## 2021-04-05 MED ORDER — FUROSEMIDE 10 MG/ML IJ SOLN
20.0000 mg | Freq: Once | INTRAMUSCULAR | Status: AC
  Administered 2021-04-05: 20 mg via INTRAVENOUS
  Filled 2021-04-05: qty 2

## 2021-04-05 MED ORDER — PREDNISONE 20 MG PO TABS
40.0000 mg | ORAL_TABLET | Freq: Every day | ORAL | Status: DC
  Administered 2021-04-06: 40 mg via ORAL
  Filled 2021-04-05: qty 2

## 2021-04-05 NOTE — Progress Notes (Addendum)
Patient ID: Donald Jacque, female   DOB: [birth year], 75 y.o.   MRN: [id number]  PROGRESS NOTE    Loralai Eisman  JJK:[id number] DOB: 11-17-1945 DOA: 03/29/2021 PCP: Roderic Scarce, MD   Brief Narrative:  75 year old female with past medical history of hypertension, diabetes mellitus type 2, paroxysmal A. fib, obesity, chronic diastolic heart failure, seizure disorder, bipolar disorder presented on 03/29/2021 for acute loss of consciousness and respiratory stress from rehab facility.  She was found to be extremely hypoxic and acidotic and required emergent intubation.  There was a concern for pneumonia and UTI on admission as a source of sepsis.  CT of the head was negative for acute intracranial process but showed possible sinusitis.  CT of the abdomen/pelvis showed large amount of stool in distal sigmoid colon.  She was subsequently extubated and reintubated and then we extubated.  Assessment & Plan:   Acute hypoxic hypercapnic respiratory failure Pneumonia: Present on admission -Patient was intubated on admission; subsequently extubated and reintubated and extubated again -Currently requiring BiPAP at night and 2 to 3 L oxygen via nasal cannula during daytime. -PCCM signed off on 04/04/2021 recommending nightly BiPAP -IV antibiotics have been narrowed down to cefazolin currently. -2D echo showed EF of 60 to 65% with no wall motion abnormality; moderately elevated pulmonary artery systolic pressure -Patient received a dose of IV Lasix on 04/04/2021 by pulmonary. -PCCM recommended to taper steroids because of worsening agitation.  Probable OSA/OHS -PCCM recommends nightly BiPAP and outpatient sleep study  Septic shock: Present on admission Proteus mirabilis UTI: Present on admission -Currently on Ancef.  Off pressors. -Sepsis has resolved.  Currently hemodynamically stable. -Stop IV antibiotics after today's doses.  Leukocytosis -Possibly from above.  WBCs have worsened to 20.   Monitor.  Acute metabolic and toxic encephalopathy Seizure disorder Bipolar disorder -Possibly from a combination of hypercapnia and steroids -CT and MRI of the brain were negative for acute abnormalities. -Limited EEG suggestive of moderate to severe diffuse encephalopathy, nonspecific, no seizures or definite epileptiform discharges were seen throughout the recording. -Ammonia level within normal limits.  -Mental status improving with BiPAP use and resume some of Risperdal. -Psychiatry following: Continue Depakote and risperidone.  Hypertension -Continue Coreg  Hyperlipidemia -Continue statin  Paroxysmal A. Fib -Currently intermittently tachycardic and bradycardic.  Continue Coreg and Eliquis.  Outpatient follow-up with cardiology  Diabetes mellitus type 2 -A1c 7.  Continue CBGs with SSI.  Generalized conditioning -Patient will need to go back to SNF. -Overall prognosis is guarded to poor.  Patient has had palliative care discussion during this hospitalization.  Remains full code.  Constipation -Continue bowel regimen  DVT prophylaxis: Eliquis Code Status: Full Family Communication: None at bedside Disposition Plan: Status is: Inpatient  Remains inpatient appropriate because:Inpatient level of care appropriate due to severity of illness  Dispo: The patient is from: SNF              Anticipated d/c is to: SNF              Patient currently is not medically stable to d/c.   Difficult to place patient No  Consultants: PCCM/psychiatry/palliative care  Procedures: EEG/2D echo  Antimicrobials:  Anti-infectives (From admission, onward)    Start     Dose/Rate Route Frequency Ordered Stop   04/02/21 1400  ceFAZolin (ANCEF) IVPB 1 g/50 mL premix        1 g 100 mL/hr over 30 Minutes Intravenous Every 8 hours 04/02/21 1121     03/30/21 1200  cefTRIAXone (ROCEPHIN) 2 g in sodium chloride 0.9 % 100 mL IVPB  Status:  Discontinued        2 g 200 mL/hr over 30 Minutes  Intravenous Every 24 hours 03/30/21 1121 04/02/21 1121   03/30/21 1000  vancomycin (VANCOREADY) IVPB 1500 mg/300 mL  Status:  Discontinued        1,500 mg 150 mL/hr over 120 Minutes Intravenous Every 24 hours 03/29/21 2054 03/31/21 0924   03/30/21 0200  piperacillin-tazobactam (ZOSYN) IVPB 3.375 g  Status:  Discontinued        3.375 g 12.5 mL/hr over 240 Minutes Intravenous Every 8 hours 03/29/21 2054 03/30/21 1121   03/30/21 0027  azithromycin (ZITHROMAX) 500 mg in sodium chloride 0.9 % 250 mL IVPB  Status:  Discontinued        500 mg 250 mL/hr over 60 Minutes Intravenous Every 24 hours 03/30/21 0027 03/30/21 0424   03/29/21 1915  vancomycin (VANCOCIN) IVPB 1000 mg/200 mL premix        1,000 mg 100 mL/hr over 120 Minutes Intravenous  Once 03/29/21 1914 03/29/21 2313   03/29/21 1915  piperacillin-tazobactam (ZOSYN) IVPB 3.375 g        3.375 g 100 mL/hr over 30 Minutes Intravenous  Once 03/29/21 1914 03/29/21 2324        Subjective: Patient seen and examined at bedside.  Slow to respond, poor historian, answers some questions appropriately.  No overnight agitation reported by nursing staff.  No overnight fever, seizures reported.  Objective: Vitals:   04/05/21 1149 04/05/21 1150 04/05/21 1151 04/05/21 1152  BP:      Pulse: (!) 42 66 (!) 117 (!) 43  Resp: (!) 0 (!) 0 (!) 0   Temp:      TempSrc:      SpO2: 95% 95% 95% 95%  Weight:      Height:        Intake/Output Summary (Last 24 hours) at 04/05/2021 1158 Last data filed at 04/05/2021 0844 Gross per 24 hour  Intake 501.21 ml  Output 1100 ml  Net -598.79 ml   Filed Weights   04/02/21 0500 04/04/21 0500 04/05/21 0500  Weight: 107.1 kg 108 kg 104.3 kg    Examination:  General exam: Appears calm and comfortable.  Looks chronically ill and deconditioned.  Currently on 3 L oxygen via nasal cannula Respiratory system: Bilateral decreased breath sounds at bases with some scattered crackles Cardiovascular system: S1 & S2  heard, intermittently tachycardic and bradycardic  gastrointestinal system: Abdomen is nondistended, soft and nontender. Normal bowel sounds heard. Extremities: No cyanosis, clubbing; trace lower extremity edema present  Central nervous system: Awake, answers some patient's but extremely slow to respond.  Poor historian.  no focal neurological deficits. Moving extremities Skin: No rashes, lesions or ulcers Psychiatry: Extremely flat affect.  Does not participate in conversation much.   Data Reviewed: I have personally reviewed following labs and imaging studies  CBC: Recent Labs  Lab 03/29/21 1614 03/30/21 0311 04/01/21 0242 04/02/21 0304 04/03/21 0311 04/04/21 0553 04/05/21 0323  WBC 12.7* 8.3 10.3 10.7* 6.8 10.0 20.2*  NEUTROABS 7.7 6.2  --   --  5.4 6.2  --   HGB 12.2 10.8* 11.6* 10.6* 10.5* 10.6* 10.3*  HCT 39.1 34.6* 38.9 35.9* 34.8* 35.5* 33.8*  MCV 91.8 91.3 93.5 94.2 92.8 93.2 93.1  PLT 193 169 192 191 203 210 209   Basic Metabolic Panel: Recent Labs  Lab 03/29/21 2130 03/30/21 0311 03/31/21 0308 04/01/21 0242 04/02/21 0304  04/03/21 0311 04/03/21 1333 04/04/21 0553 04/05/21 0323  NA  --  131*  --  138 135 134*  --  135 135  K  --  4.9  --  3.8 3.9 4.4  --  4.0 3.6  CL  --  91*  --  101 96* 96*  --  94* 95*  CO2  --  30  --  29 32 31  --  34* 35*  GLUCOSE  --  129*  --  115* 117* 291*  --  193* 179*  BUN  --  22  --  --  23 26*  CREATININE  --  0.53   < > 0.47 0.50 0.52  --  0.71 0.66  CALCIUM  --  8.3*  --  8.2* 8.2* 8.5*  --  8.9 8.5*  MG 2.0 1.8  --  1.9  --   --  2.2 2.0  --   PHOS 6.3* 4.3  --   --   --   --  2.4* 3.1 2.4*   < > = values in this interval not displayed.   GFR: Estimated Creatinine Clearance: 76.4 mL/min (by C-G formula based on SCr of 0.66 mg/dL). Liver Function Tests: Recent Labs  Lab 03/29/21 1614 03/30/21 0311 04/05/21 0323  AST 9* 12* 9*  ALT ALKPHOS 39 35* 27*  BILITOT 0.4 0.4 0.4  PROT 6.3* 5.4* 5.0*   ALBUMIN 3.1* 2.6* 2.3*   No results for input(s): LIPASE, AMYLASE in the last 168 hours. Recent Labs  Lab 03/29/21 1610 04/02/21 1014  AMMONIA 16 28   Coagulation Profile: No results for input(s): INR, PROTIME in the last 168 hours. Cardiac Enzymes: Recent Labs  Lab 03/29/21 2130  CKTOTAL 34*   BNP (last 3 results) No results for input(s): PROBNP in the last 8760 hours. HbA1C: No results for input(s): HGBA1C in the last 72 hours. CBG: Recent Labs  Lab 04/03/21 1739 04/04/21 0826 04/04/21 1322 04/04/21 1634 04/05/21 0828  GLUCAP 223* 177* 114* 175* 118*   Lipid Profile: No results for input(s): CHOL, HDL, LDLCALC, TRIG, CHOLHDL, LDLDIRECT in the last 72 hours. Thyroid Function Tests: No results for input(s): TSH, T4TOTAL, FREET4, T3FREE, THYROIDAB in the last 72 hours. Anemia Panel: No results for input(s): VITAMINB12, FOLATE, FERRITIN, TIBC, IRON, RETICCTPCT in the last 72 hours. Sepsis Labs: Recent Labs  Lab 03/29/21 1610 03/29/21 2130 04/03/21 1333 04/04/21 0553 04/05/21 0323  PROCALCITON  --  <0.10  --  <0.10 <0.10  LATICACIDVEN 1.0  --  2.8* 1.4  --     Recent Results (from the past 240 hour(s))  Resp Panel by RT-PCR (Flu A&B, Covid) Nasopharyngeal Swab     Status: None   Collection Time: 03/29/21  4:12 PM   Specimen: Nasopharyngeal Swab; Nasopharyngeal(NP) swabs in vial transport medium  Result Value Ref Range Status   SARS Coronavirus 2 by RT PCR NEGATIVE NEGATIVE Final    Comment: (NOTE) SARS-CoV-2 target nucleic acids are NOT DETECTED.  The SARS-CoV-2 RNA is generally detectable in upper respiratory specimens during the acute phase of infection. The lowest concentration of SARS-CoV-2 viral copies this assay can detect is 138 copies/mL. A negative result does not preclude SARS-Cov-2 infection and should not be used as the sole basis for treatment or other patient management decisions. A negative result may occur with  improper specimen  collection/handling, submission of specimen other than nasopharyngeal swab, presence of viral mutation(s) within the areas  targeted by this assay, and inadequate number of viral copies(<138 copies/mL). A negative result must be combined with clinical observations, patient history, and epidemiological information. The expected result is Negative.  Fact Sheet for Patients:  BloggerCourse.com  Fact Sheet for Healthcare Providers:  SeriousBroker.it  This test is no t yet approved or cleared by the [place] FDA and  has been authorized for detection and/or diagnosis of SARS-CoV-2 by FDA under an Emergency Use Authorization (EUA). This EUA will remain  in effect (meaning this test can be used) for the duration of the COVID-19 declaration under Section 564(b)(1) of the Act, 21 U.S.C.section 360bbb-3(b)(1), unless the authorization is terminated  or revoked sooner.       Influenza A by PCR NEGATIVE NEGATIVE Final   Influenza B by PCR NEGATIVE NEGATIVE Final    Comment: (NOTE) The Xpert Xpress SARS-CoV-2/FLU/RSV plus assay is intended as an aid in the diagnosis of influenza from Nasopharyngeal swab specimens and should not be used as a sole basis for treatment. Nasal washings and aspirates are unacceptable for Xpert Xpress SARS-CoV-2/FLU/RSV testing.  Fact Sheet for Patients: BloggerCourse.com  Fact Sheet for Healthcare Providers: SeriousBroker.it  This test is not yet approved or cleared by the [place] FDA and has been authorized for detection and/or diagnosis of SARS-CoV-2 by FDA under an Emergency Use Authorization (EUA). This EUA will remain in effect (meaning this test can be used) for the duration of the COVID-19 declaration under Section 564(b)(1) of the Act, 21 U.S.C. section 360bbb-3(b)(1), unless the authorization is terminated or revoked.  Performed at [facility], [address] [place], [place] [address]   Culture, blood (single)     Status: None   Collection Time: 03/29/21  4:46 PM   Specimen: BLOOD  Result Value Ref Range Status   Specimen Description   Final    BLOOD BLOOD RIGHT FOREARM Performed at [facility], [address] [place], [place] [address]    Special Requests   Final    BOTTLES DRAWN AEROBIC AND ANAEROBIC Blood Culture adequate volume Performed at [facility], [address] [place], [place] [address]    Culture   Final    NO GROWTH 5 DAYS Performed at [facility], [address] [place], [place] [address]    Report Status 04/03/2021 FINAL  Final  Urine Culture     Status: Abnormal   Collection Time: 03/29/21  8:46 PM   Specimen: Urine, Clean Catch  Result Value Ref Range Status   Specimen Description   Final    URINE, CLEAN CATCH Performed at [facility], [address], [place], [place] [address]    Special Requests   Final    NONE Performed at [facility], [address] [place], [place] [address]    Culture >=100,000 COLONIES/mL PROTEUS MIRABILIS (A)  Final   Report Status 04/01/2021 FINAL  Final   Organism ID, Bacteria PROTEUS MIRABILIS (A)  Final      Susceptibility   Proteus mirabilis - MIC*    AMPICILLIN <=2 SENSITIVE Sensitive     CEFAZOLIN <=4 SENSITIVE Sensitive     CEFEPIME <=0.12 SENSITIVE Sensitive     CEFTRIAXONE <=0.25 SENSITIVE Sensitive     CIPROFLOXACIN <=0.25 SENSITIVE Sensitive     GENTAMICIN <=1 SENSITIVE Sensitive     IMIPENEM 1 SENSITIVE Sensitive     NITROFURANTOIN 128 RESISTANT Resistant     TRIMETH/SULFA <=20 SENSITIVE Sensitive  AMPICILLIN/SULBACTAM <=2 SENSITIVE Sensitive     PIP/TAZO <=4 SENSITIVE Sensitive     * >=100,000 COLONIES/mL PROTEUS MIRABILIS  Culture, blood (single)     Status: None   Collection Time: 03/29/21  9:30 PM   Specimen: BLOOD  Result Value Ref Range  Status   Specimen Description   Final    BLOOD RIGHT ANTECUBITAL Performed at [facility], [address], [place], [place] [address]    Special Requests   Final    BOTTLES DRAWN AEROBIC AND ANAEROBIC Blood Culture adequate volume Performed at [facility], [address], [place], [place] [address]    Culture   Final    NO GROWTH 5 DAYS Performed at [facility], [address], [place], [place] [address]    Report Status 04/03/2021 FINAL  Final  MRSA Next Gen by PCR, Nasal     Status: Abnormal   Collection Time: 03/30/21 12:00 AM   Specimen: Nasal Mucosa; Nasal Swab  Result Value Ref Range Status   MRSA by PCR Next Gen DETECTED (A) NOT DETECTED Final    Comment: RESULT CALLED TO, READ BACK BY AND VERIFIED WITH: KAREN, RN @ 0403 ON 03/30/21 C VARNER (NOTE) The GeneXpert MRSA Assay (FDA approved for NASAL specimens only), is one component of a comprehensive MRSA colonization surveillance program. It is not intended to diagnose MRSA infection nor to guide or monitor treatment for MRSA infections. Test performance is not FDA approved in patients less than 38 years old. Performed at [facility], [address], [place], [place] [address]   Culture, blood (x 2)     Status: None   Collection Time: 03/30/21  1:53 AM   Specimen: BLOOD  Result Value Ref Range Status   Specimen Description   Final    BLOOD BLOOD LEFT HAND Performed at [facility], [address] [place], [place] [address]    Special Requests   Final    BOTTLES DRAWN AEROBIC ONLY Blood Culture adequate volume Performed at [facility], [address], [place], [place] [address]    Culture   Final    NO GROWTH 5 DAYS Performed at [facility], [address], [place], [place] [address]    Report Status 04/04/2021 FINAL  Final  Culture, blood (x 2)     Status: None   Collection Time: 03/30/21  3:11 AM    Specimen: BLOOD  Result Value Ref Range Status   Specimen Description   Final    BLOOD BLOOD LEFT HAND Performed at [facility] [address] [place], [place] [address]    Special Requests   Final    BOTTLES DRAWN AEROBIC ONLY Blood Culture adequate volume Performed at [facility], [address], [place], [place] [address]    Culture   Final    NO GROWTH 5 DAYS Performed at [facility], [address] [place], [place] [address]    Report Status 04/04/2021 FINAL  Final         Radiology Studies: ECHOCARDIOGRAM COMPLETE  Result Date: 04/03/2021    ECHOCARDIOGRAM REPORT   Patient Name:   UYEN EICHHOLZ Date of Exam: 04/03/2021 Medical Rec #:  [id number]      Height:       66.0 in Accession #:    [id number]     Weight:       236.1 lb Date of Birth:  12-Jan-1946       BSA:          2.146 m Patient Age:    75 years       BP:           156/63 mmHg Patient Gender: F              HR:           70 bpm. Exam Location:  Inpatient Procedure: 2D Echo, Cardiac Doppler and Color Doppler Indications:    CHF  History:        Patient has prior history of Echocardiogram examinations, most                 recent 11/19/2020. Arrythmias:Atrial Fibrillation and Cardiac                 Arrest; Risk Factors:Diabetes and Dyslipidemia.  Sonographer:    Neomia Dear RDCS Referring Phys: 81 MURALI RAMASWAMY  Sonographer Comments: Technically challenging study due to limited acoustic windows and patient is morbidly obese. Image acquisition challenging due to patient body habitus. IMPRESSIONS  1. Technically difficult study. Left ventricular ejection fraction, by estimation, is 60 to 65%. The left ventricle has normal function. The left ventricle has no regional wall motion abnormalities. There is moderate asymmetric left ventricular hypertrophy of the basal-septal segment.     Left ventricular diastolic parameters are consistent with Grade II diastolic dysfunction  (pseudonormalization). Elevated left atrial pressure.  2. Right ventricule is poorly visualized but grossly normal size and systolic function. There is moderately elevated pulmonary artery systolic pressure. The estimated right ventricular systolic pressure is 47.7 mmHg.  3. The mitral valve is normal in structure. No evidence of mitral valve regurgitation.  4. The aortic valve is tricuspid. Aortic valve regurgitation is not visualized. No aortic stenosis is present. FINDINGS  Left Ventricle: Left ventricular ejection fraction, by estimation, is 60 to 65%. The left ventricle has normal function. The left ventricle has no regional wall motion abnormalities. The left ventricular internal cavity size was normal in size. There is  moderate asymmetric left ventricular hypertrophy of the basal-septal segment. Left ventricular diastolic parameters are consistent with Grade II diastolic dysfunction (pseudonormalization). Elevated left atrial pressure. Right Ventricle: The right ventricular size is normal. Right vetricular wall thickness was not well visualized. Right ventricular systolic function is normal. There is moderately elevated pulmonary artery systolic pressure. The tricuspid regurgitant velocity is 3.15 m/s, and with an assumed right atrial pressure of 8 mmHg, the estimated right ventricular systolic pressure is 47.7 mmHg. Left Atrium: Left atrial size was normal in size. Right Atrium: Right atrial size was normal in size. Pericardium: There is no evidence of pericardial effusion. Presence of pericardial fat pad. Mitral Valve: The mitral valve is normal in structure. No evidence of mitral valve regurgitation. Tricuspid Valve: The tricuspid valve is normal in structure. Tricuspid valve regurgitation is trivial. Aortic Valve: The aortic valve is tricuspid. Aortic valve regurgitation is not visualized. No aortic stenosis is present. Aortic valve mean gradient measures 5.0 mmHg. Aortic valve peak gradient measures  10.0 mmHg. Aortic valve area, by VTI measures 1.79  cm. Pulmonic Valve: The pulmonic valve was not well visualized. Pulmonic valve regurgitation is trivial. Aorta: The aortic root and ascending aorta are structurally normal, with no evidence of dilitation. IAS/Shunts: The interatrial septum was not well visualized.  LEFT VENTRICLE PLAX 2D LVIDd:         4.50 cm     Diastology LVIDs:  3.30 cm     LV e' medial:    4.90 cm/s LV PW:         1.60 cm     LV E/e' medial:  20.8 LV IVS:        1.50 cm     LV e' lateral:   7.62 cm/s LVOT diam:     1.80 cm     LV E/e' lateral: 13.4 LV SV:         67 LV SV Index:   31 LVOT Area:     2.54 cm  LV Volumes (MOD) LV vol d, MOD A4C: 64.0 ml LV vol s, MOD A4C: 21.2 ml LV SV MOD A4C:     64.0 ml RIGHT VENTRICLE RV Basal diam:  3.80 cm RV Mid diam:    2.00 cm RV S prime:     16.00 cm/s TAPSE (M-mode): 2.6 cm LEFT ATRIUM           Index       RIGHT ATRIUM           Index LA diam:      3.80 cm 1.77 cm/m  RA Area:     14.80 cm LA Vol (A4C): 63.9 ml 29.78 ml/m RA Volume:   34.90 ml  16.26 ml/m  AORTIC VALVE                    PULMONIC VALVE AV Area (Vmax):    1.72 cm     PV Vmax:       0.98 m/s AV Area (Vmean):   1.68 cm     PV Vmean:      66.000 cm/s AV Area (VTI):     1.79 cm     PV VTI:        0.230 m AV Vmax:           158.00 cm/s  PV Peak grad:  3.9 mmHg AV Vmean:          106.000 cm/s PV Mean grad:  2.0 mmHg AV VTI:            0.376 m AV Peak Grad:      10.0 mmHg AV Mean Grad:      5.0 mmHg LVOT Vmax:         107.00 cm/s LVOT Vmean:        70.000 cm/s LVOT VTI:          0.264 m LVOT/AV VTI ratio: 0.70  AORTA Ao Root diam: 2.70 cm Ao Asc diam:  3.60 cm MITRAL VALVE                TRICUSPID VALVE MV Area (PHT): 4.57 cm     TR Peak grad:   39.7 mmHg MV Decel Time: 166 msec     TR Vmax:        315.00 cm/s MV E velocity: 102.00 cm/s MV A velocity: 123.00 cm/s  SHUNTS MV E/A ratio:  0.83         Systemic VTI:  0.26 m                             Systemic Diam: 1.80 cm  Epifanio Lesches MD Electronically signed by Epifanio Lesches MD Signature Date/Time: 04/03/2021/5:53:41 PM    Final         Scheduled Meds:  apixaban  5 mg Oral BID   atorvastatin  10 mg Oral  Daily   carvedilol  6.25 mg Oral BID WC   divalproex  500 mg Oral Q12H   ferrous sulfate  325 mg Oral Daily   fluticasone  2 spray Each Nare Daily   furosemide  20 mg Intravenous Once   insulin aspart  0-9 Units Subcutaneous TID WC   loratadine  10 mg Oral Daily   mouth rinse  15 mL Mouth Rinse BID   methylPREDNISolone (SOLU-MEDROL) injection  40 mg Intravenous Daily   pantoprazole  40 mg Oral Daily   phosphorus  250 mg Oral BID   polyethylene glycol  17 g Oral BID   risperiDONE  1 mg Oral BID   senna-docusate  2 tablet Oral QHS   Continuous Infusions:  sodium chloride 10 mL/hr at 04/05/21 0300   sodium chloride Stopped (04/02/21 0137)    ceFAZolin (ANCEF) IV Stopped (04/05/21 0650)          Glade LloydKshitiz Anthonny Schiller, MD [facility] 04/05/2021, 11:58 AM

## 2021-04-05 NOTE — Consult Note (Signed)
[facility] Face-to-Face Psychiatry Consult   Reason for Consult: " Aggressive behavior and threatening/demeaning language to staff in a patient who carries history of bipolar disorder."  Referring Physician:  Celine Mans Patient Identification: Becky Hendricks MRN:  [id number] Principal Diagnosis: Severe sepsis [facility]) Diagnosis:  Principal Problem:   Severe sepsis (HCC) Active Problems:   Metabolic encephalopathy   Urinary tract infection without hematuria   AF (paroxysmal atrial fibrillation) (HCC)   Sacral wound   Bipolar disorder (HCC)   Type 2 diabetes mellitus with hyperglycemia, with long-term current use of insulin (HCC)   Encephalopathy   Acute respiratory failure with hypoxia (HCC)   Hyponatremia   Functional quadriplegia (HCC)   Pneumonia of lower lobe due to infectious organism   Colitis   Acute respiratory failure (HCC)   Acute on chronic respiratory failure with hypoxia and hypercapnia (HCC)  Total Time spent with patient: 20 minutes   Subjective: I am doing okay.  Objective: Patient is seen, chart reviewed by this nurse practitioner.  She is observed to be sleeping upon entry into the room, however she does awake easily.  When awoken she is able to remain attentive, and is alert and oriented x3.  On today's evaluation patient is very pleasant.  Patient seen, assessed and chart reviewed. She is alert and oriented x 3, very pleasant and engaging well with this provider.  She is able to recall coming in from [facility] facility for weakness.  On today's evaluation she further denies any psychosis, hallucinations, and or delusions.  She denies any aggressive or overt agitation this admission.  Patient reports compliance with her medication as it pertains to her bipolar disorder.  She feels that she is doing well at this time.  She denies any suicidal ideations, homicidal ideations, and or auditory or visual hallucinations.  Furthermore she does not appear to be exhibiting any delusions, and or  responding to internal and external stimuli.    Psych consult was placed for agitation, aggression, demeaning language and behaviors towards nursing staff.  Chart review shows there has been no nursing documentation regarding patient behaviors in over 36 hours.  Patient appears to be resting well, in bed.  She does not appear to be exhibiting any disruptive behaviors, that will pose any safety concerns at this time.  Patient's medication was decreased on admission due to her encephalopathy, and this likely was the cause of her behaviors that she has presented with days prior.  Medications have been adjusted, and will titrate slowly to further target symptoms of bipolar disorder.  Past Psychiatric History: bipolar.  Was previously taking Depakote 500 mg p.o. twice daily, Risperdal 2 mg p.o. twice daily.  Risk to Self:  none Risk to Others:  none Prior Inpatient Therapy:  unknown Prior Outpatient Therapy:  unknown  Past Medical History:  Past Medical History:  Diagnosis Date   Atrial fibrillation (HCC)    Bipolar disorder (HCC)    Bipolar disorder (HCC) 04/27/2020   Clostridium difficile diarrhea 08/02/2019   Dehydration    Essential hypertension    Fall 02/11/2020   Morbid obesity (HCC)    Osteomyelitis (HCC) 12/03/2019   Pneumonia due to COVID-19 virus 09/15/2019   Type 2 diabetes mellitus (HCC)    Weakness     Past Surgical History:  Procedure Laterality Date   INCISION AND DRAINAGE PERIRECTAL ABSCESS Left 12/04/2019   Procedure: IRRIGATION AND DEBRIDEMENT LEFT BUTTOCK ABSCESS;  Surgeon: Violeta Gelinas, MD;  Location: [facility];  Service: General;  Laterality: Left;  INCISION AND DRAINAGE PERIRECTAL ABSCESS Left 12/10/2019   Procedure: REPEAT IRRIGATION AND DEBRIDEMENT BUTTOCK  ABSCESS;  Surgeon: Abigail MiyamotoBlackman, Douglas, MD;  Location: [facility] OR;  Service: General;  Laterality: Left;   IR FLUORO GUIDE CV LINE RIGHT  11/25/2020   IR RADIOLOGIST EVAL & MGMT  12/29/2020   IR US GUIDE VASC ACCESS RIGHT   11/25/2020   Family History:  Family History  Family history unknown: Yes   Family Psychiatric  History: unknown Social History:  Social History   Substance and Sexual Activity  Alcohol Use Not Currently     Social History   Substance and Sexual Activity  Drug Use Not Currently    Social History   Socioeconomic History   Marital status: Single    Spouse name: Not on file   Number of children: Not on file   Years of education: Not on file   Highest education level: Not on file  Occupational History   Not on file  Tobacco Use   Smoking status: Never   Smokeless tobacco: Never  Vaping Use   Vaping Use: Unknown  Substance and Sexual Activity   Alcohol use: Not Currently   Drug use: Not Currently   Sexual activity: Not Currently    Birth control/protection: Post-menopausal  Other Topics Concern   Not on file  Social History Narrative   Not on file   Social Determinants of Health   Financial Resource Strain: Not on file  Food Insecurity: Not on file  Transportation Needs: Not on file  Physical Activity: Not on file  Stress: Not on file  Social Connections: Not on file   Additional Social History:    Allergies:   Allergies  Allergen Reactions   Chlorhexidine Gluconate Itching   Vancomycin     Vancomycin infusion related reaction- May need Vanc to be given at a slower rate    Acetazolamide Er Rash    Labs:  Results for orders placed or performed during the hospital encounter of 03/29/21 (from the past 48 hour(s))  Glucose, capillary     Status: Abnormal   Collection Time: 04/03/21 12:57 PM  Result Value Ref Range   Glucose-Capillary 274 (H) 70 - 99 mg/dL    Comment: Glucose reference range applies only to samples taken after fasting for at least 8 hours.   Comment 1 Notify RN    Comment 2 Document in Chart   Lactic acid, plasma     Status: Abnormal   Collection Time: 04/03/21  1:33 PM  Result Value Ref Range   Lactic Acid, Venous 2.8 (HH) 0.5 - 1.9  mmol/L    Comment: CRITICAL RESULT CALLED TO, READ BACK BY AND VERIFIED WITH: CANDACE, RN @ 1504 ON 04/03/2021 BY Shon BatonBROOKS, L Performed at [facility], [address] [place], [place] [address]   Brain natriuretic peptide     Status: Abnormal   Collection Time: 04/03/21  1:33 PM  Result Value Ref Range   B Natriuretic Peptide 234.6 (H) 0.0 - 100.0 pg/mL    Comment: Performed at [facility], [address] [place], [place] [address]  Magnesium     Status: None   Collection Time: 04/03/21  1:33 PM  Result Value Ref Range   Magnesium 2.2 1.7 - 2.4 mg/dL    Comment: Performed at [facility], [address] [place], [place] [address]  Phosphorus     Status: Abnormal   Collection Time: 04/03/21  1:33 PM  Result Value  Ref Range   Phosphorus 2.4 (L) 2.5 - 4.6 mg/dL    Comment: Performed at [facility], [address], [place], [place] [address]  Troponin I (High Sensitivity)     Status: None   Collection Time: 04/03/21  1:33 PM  Result Value Ref Range   Troponin I (High Sensitivity) 7 <18 ng/L    Comment: (NOTE) Elevated high sensitivity troponin I (hsTnI) values and significant  changes across serial measurements may suggest ACS but many other  chronic and acute conditions are known to elevate hsTnI results.  Refer to the "Links" section for chest pain algorithms and additional  guidance. Performed at [facility], [address] [place], [place] [address]   Blood gas, arterial     Status: Abnormal   Collection Time: 04/03/21  2:06 PM  Result Value Ref Range   pH, Arterial 7.393 7.350 - 7.450   pCO2 arterial 56.3 (H) 32.0 - 48.0 mmHg   pO2, Arterial 64.8 (L) 83.0 - 108.0 mmHg   Bicarbonate 33.6 (H) 20.0 - 28.0 mmol/L   Acid-Base Excess 7.7 (H) 0.0 - 2.0 mmol/L   O2 Saturation 92.9 %   Patient temperature 98.6    Allens test (pass/fail) PASS PASS    Comment: Performed at [facility], [address], [place], [place] [address]  Glucose, capillary     Status: Abnormal   Collection Time: 04/03/21  5:39 PM  Result Value Ref Range   Glucose-Capillary 223 (H) 70 - 99 mg/dL    Comment: Glucose reference range applies only to samples taken after fasting for at least 8 hours.  CBC with Differential/Platelet     Status: Abnormal   Collection Time: 04/04/21  5:53 AM  Result Value Ref Range   WBC 10.0 4.0 - 10.5 K/uL   RBC 3.81 (L) 3.87 - 5.11 MIL/uL   Hemoglobin 10.6 (L) 12.0 - 15.0 g/dL   HCT 91.4 (L) 78.2 - 95.6 %   MCV 93.2 80.0 - 100.0 fL   MCH 27.8 26.0 - 34.0 pg   MCHC 29.9 (L) 30.0 - 36.0 g/dL   RDW 21.3 08.6 - 57.8 %   Platelets 210 150 - 400 K/uL   nRBC 0.0 0.0 - 0.2 %   Neutrophils Relative % 62 %   Neutro Abs 6.2 1.7 - 7.7 K/uL   Lymphocytes Relative 19 %   Lymphs Abs 1.9 0.7 - 4.0 K/uL   Monocytes Relative 16 %   Monocytes Absolute 1.6 (H) 0.1 - 1.0 K/uL   Eosinophils Relative 2 %   Eosinophils Absolute 0.2 0.0 - 0.5 K/uL   Basophils Relative 0 %   Basophils Absolute 0.0 0.0 - 0.1 K/uL   Immature Granulocytes 1 %   Abs Immature Granulocytes 0.08 (H) 0.00 - 0.07 K/uL    Comment: Performed at [facility], [address], [place], [place] [address]  Basic metabolic panel     Status: Abnormal   Collection Time: 04/04/21  5:53 AM  Result Value Ref Range   Sodium 135 135 - 145 mmol/L   Potassium 4.0 3.5 - 5.1 mmol/L   Chloride 94 (L) 98 - 111 mmol/L   CO2 34 (H) 22 - 32 mmol/L   Glucose, Bld 193 (H) 70 - 99 mg/dL    Comment: Glucose reference range applies only to samples taken after fasting for at least 8 hours.   BUN 23 8 - 23 mg/dL   Creatinine, Ser 9.52 0.44 -  1.00 mg/dL   Calcium 8.9 8.9 - 16.6 mg/dL   GFR, Estimated >06 >00 mL/min    Comment: (NOTE) Calculated using the CKD-EPI Creatinine Equation (2021)    Anion gap 7 5 - 15    Comment: Performed at [facility], [address], [place], [place] [address]  Magnesium     Status: None   Collection Time: 04/04/21  5:53 AM  Result Value Ref Range   Magnesium 2.0 1.7 - 2.4 mg/dL    Comment: Performed at [facility], [address], [place], [place] [address]  Phosphorus     Status: None   Collection Time: 04/04/21  5:53 AM  Result Value Ref Range   Phosphorus 3.1 2.5 - 4.6 mg/dL    Comment: Performed at [facility], [address] [place], [place] [address]  Lactic acid, plasma     Status: None   Collection Time: 04/04/21  5:53 AM  Result Value Ref Range   Lactic Acid, Venous 1.4 0.5 - 1.9 mmol/L    Comment: Performed at [facility], [address], [place], [place] [address]  Procalcitonin     Status: None   Collection Time: 04/04/21  5:53 AM  Result Value Ref Range   Procalcitonin <0.10 ng/mL    Comment:        Interpretation: PCT (Procalcitonin) <= 0.5 ng/mL: Systemic infection (sepsis) is not likely. Local bacterial infection is possible. (NOTE)       Sepsis PCT Algorithm           Lower Respiratory Tract                                      Infection PCT Algorithm    ----------------------------     ----------------------------         PCT < 0.25 ng/mL                PCT < 0.10 ng/mL          Strongly encourage             Strongly discourage   discontinuation of antibiotics    initiation of antibiotics    ----------------------------     -----------------------------       PCT 0.25 - 0.50 ng/mL            PCT 0.10 - 0.25 ng/mL               OR       >80% decrease in PCT            Discourage initiation of                                            antibiotics      Encourage discontinuation           of antibiotics    ----------------------------     -----------------------------         PCT >= 0.50 ng/mL              PCT 0.26 - 0.50 ng/mL               AND        <80% decrease in PCT  Encourage initiation of                                              antibiotics       Encourage continuation           of antibiotics    ----------------------------     -----------------------------        PCT >= 0.50 ng/mL                  PCT > 0.50 ng/mL               AND         increase in PCT                  Strongly encourage                                      initiation of antibiotics    Strongly encourage escalation           of antibiotics                                     -----------------------------                                           PCT <= 0.25 ng/mL                                                 OR                                        > 80% decrease in PCT                                      Discontinue / Do not initiate                                             antibiotics  Performed at [facility], [address] [place], [place] [address]   Troponin I (High Sensitivity)     Status: None   Collection Time: 04/04/21  5:53 AM  Result Value Ref Range   Troponin I (High Sensitivity) 9 <18 ng/L    Comment: (NOTE) Elevated high sensitivity troponin I (hsTnI) values and significant  changes across serial measurements may suggest ACS but many other  chronic and acute conditions are known to elevate hsTnI results.  Refer to the "Links" section for chest pain algorithms and additional  guidance. Performed at [facility], [address], [place], [place] [address]   Glucose, capillary     Status: Abnormal   Collection  Time: 04/04/21  8:26 AM  Result Value Ref Range   Glucose-Capillary 177 (H) 70 - 99 mg/dL    Comment: Glucose reference range applies only to samples taken after fasting for at least 8 hours.   Comment 1 Notify RN    Comment 2 Document in Chart   Glucose, capillary     Status: Abnormal   Collection Time: 04/04/21  1:22 PM  Result Value Ref Range   Glucose-Capillary 114 (H) 70 - 99 mg/dL    Comment: Glucose reference range applies only  to samples taken after fasting for at least 8 hours.   Comment 1 Document in Chart   Glucose, capillary     Status: Abnormal   Collection Time: 04/04/21  4:34 PM  Result Value Ref Range   Glucose-Capillary 175 (H) 70 - 99 mg/dL    Comment: Glucose reference range applies only to samples taken after fasting for at least 8 hours.   Comment 1 Notify RN    Comment 2 Document in Chart   CBC     Status: Abnormal   Collection Time: 04/05/21  3:23 AM  Result Value Ref Range   WBC 20.2 (H) 4.0 - 10.5 K/uL   RBC 3.63 (L) 3.87 - 5.11 MIL/uL   Hemoglobin 10.3 (L) 12.0 - 15.0 g/dL   HCT 37.1 (L) 06.2 - 69.4 %   MCV 93.1 80.0 - 100.0 fL   MCH 28.4 26.0 - 34.0 pg   MCHC 30.5 30.0 - 36.0 g/dL   RDW 85.4 62.7 - 03.5 %   Platelets 209 150 - 400 K/uL   nRBC 0.0 0.0 - 0.2 %    Comment: Performed at [facility], [address] [place], [place] [address]  Phosphorus     Status: Abnormal   Collection Time: 04/05/21  3:23 AM  Result Value Ref Range   Phosphorus 2.4 (L) 2.5 - 4.6 mg/dL    Comment: Performed at [facility], [address] [place], [place] [address]  Procalcitonin     Status: None   Collection Time: 04/05/21  3:23 AM  Result Value Ref Range   Procalcitonin <0.10 ng/mL    Comment:        Interpretation: PCT (Procalcitonin) <= 0.5 ng/mL: Systemic infection (sepsis) is not likely. Local bacterial infection is possible. (NOTE)       Sepsis PCT Algorithm           Lower Respiratory Tract                                      Infection PCT Algorithm    ----------------------------     ----------------------------         PCT < 0.25 ng/mL                PCT < 0.10 ng/mL          Strongly encourage             Strongly discourage   discontinuation of antibiotics    initiation of antibiotics    ----------------------------     -----------------------------       PCT 0.25 - 0.50 ng/mL            PCT 0.10 - 0.25 ng/mL               OR       >80%  decrease in  PCT            Discourage initiation of                                            antibiotics      Encourage discontinuation           of antibiotics    ----------------------------     -----------------------------         PCT >= 0.50 ng/mL              PCT 0.26 - 0.50 ng/mL               AND        <80% decrease in PCT             Encourage initiation of                                             antibiotics       Encourage continuation           of antibiotics    ----------------------------     -----------------------------        PCT >= 0.50 ng/mL                  PCT > 0.50 ng/mL               AND         increase in PCT                  Strongly encourage                                      initiation of antibiotics    Strongly encourage escalation           of antibiotics                                     -----------------------------                                           PCT <= 0.25 ng/mL                                                 OR                                        > 80% decrease in PCT                                      Discontinue / Do not initiate  antibiotics  Performed at [facility], [address], [place], [place] [address]   Comprehensive metabolic panel     Status: Abnormal   Collection Time: 04/05/21  3:23 AM  Result Value Ref Range   Sodium 135 135 - 145 mmol/L   Potassium 3.6 3.5 - 5.1 mmol/L   Chloride 95 (L) 98 - 111 mmol/L   CO2 35 (H) 22 - 32 mmol/L   Glucose, Bld 179 (H) 70 - 99 mg/dL    Comment: Glucose reference range applies only to samples taken after fasting for at least 8 hours.   BUN 26 (H) 8 - 23 mg/dL   Creatinine, Ser 6.21 0.44 - 1.00 mg/dL   Calcium 8.5 (L) 8.9 - 10.3 mg/dL   Total Protein 5.0 (L) 6.5 - 8.1 g/dL   Albumin 2.3 (L) 3.5 - 5.0 g/dL   AST 9 (L) 15 - 41 U/L   ALT 8 0 - 44 U/L   Alkaline Phosphatase 27 (L) 38 - 126 U/L   Total  Bilirubin 0.4 0.3 - 1.2 mg/dL   GFR, Estimated >30 >86 mL/min    Comment: (NOTE) Calculated using the CKD-EPI Creatinine Equation (2021)    Anion gap 5 5 - 15    Comment: Performed at [facility], [address], [place], [place] [address]  Glucose, capillary     Status: Abnormal   Collection Time: 04/05/21  8:28 AM  Result Value Ref Range   Glucose-Capillary 118 (H) 70 - 99 mg/dL    Comment: Glucose reference range applies only to samples taken after fasting for at least 8 hours.   Comment 1 Notify RN    Comment 2 Document in Chart     Current Facility-Administered Medications  Medication Dose Route Frequency Provider Last Rate Last Admin   0.9 %  sodium chloride infusion   Intravenous PRN Hunsucker, Lesia Sago, MD 10 mL/hr at 04/05/21 0300 Infusion Verify at 04/05/21 0300   0.9 %  sodium chloride infusion  250 mL Intravenous Continuous Karl Ito, MD   Stopped at 04/02/21 [phone]   acetaminophen (TYLENOL) tablet 650 mg  650 mg Oral Q6H PRN Canary Brim L, NP   650 mg at 04/01/21 5284   Or   acetaminophen (TYLENOL) suppository 650 mg  650 mg Rectal Q6H PRN Jeanella Craze, NP       apixaban (ELIQUIS) tablet 5 mg  5 mg Oral BID Ollis, Brandi L, NP   5 mg at 04/04/21 2109   atorvastatin (LIPITOR) tablet 10 mg  10 mg Oral Daily Doutova, Anastassia, MD   10 mg at 04/04/21 1324   carvedilol (COREG) tablet 6.25 mg  6.25 mg Oral BID WC Rodolph Bong, MD   6.25 mg at 04/04/21 1808   ceFAZolin (ANCEF) IVPB 1 g/50 mL premix  1 g Intravenous Q8H Rodolph Bong, MD   Stopped at 04/05/21 0650   divalproex (DEPAKOTE) DR tablet 500 mg  500 mg Oral Q12H Rodolph Bong, MD   500 mg at 04/04/21 2109   ferrous sulfate tablet 325 mg  325 mg Oral Daily Rodolph Bong, MD   325 mg at 04/04/21 1324   fluticasone (FLONASE) 50 MCG/ACT nasal spray 2 spray  2 spray Each Nare Daily Rodolph Bong, MD   2 spray at 04/04/21 1325   furosemide (LASIX) injection 20 mg  20 mg  Intravenous Once Kalman Shan, MD       insulin aspart (novoLOG)  injection 0-9 Units  0-9 Units Subcutaneous TID WC Rodolph Bong, MD   2 Units at 04/04/21 1808   levalbuterol [facility]) nebulizer solution 0.63 mg  0.63 mg Nebulization Q6H PRN Rodolph Bong, MD       loratadine (CLARITIN) tablet 10 mg  10 mg Oral Daily Rodolph Bong, MD   10 mg at 04/04/21 1324   MEDLINE mouth rinse  15 mL Mouth Rinse BID Hunsucker, Lesia Sago, MD   15 mL at 04/04/21 2113   methylPREDNISolone sodium succinate (SOLU-MEDROL) 40 mg/mL injection 40 mg  40 mg Intravenous Daily Desai, Rahul P, PA-C   40 mg at 04/04/21 1323   metoprolol tartrate (LOPRESSOR) injection 5 mg  5 mg Intravenous Q2H PRN Rodolph Bong, MD   5 mg at 04/04/21 2225   ondansetron (ZOFRAN) injection 4 mg  4 mg Intravenous Q6H PRN Kirtland Bouchard, MD       pantoprazole (PROTONIX) EC tablet 40 mg  40 mg Oral Daily Rodolph Bong, MD   40 mg at 04/04/21 1324   phosphorus (K PHOS NEUTRAL) tablet 250 mg  250 mg Oral BID Rodolph Bong, MD   250 mg at 04/04/21 2109   polyethylene glycol (MIRALAX / GLYCOLAX) packet 17 g  17 g Oral BID Rodolph Bong, MD   17 g at 04/01/21 2151   risperiDONE (RISPERDAL) tablet 1 mg  1 mg Oral BID Maryagnes Amos, FNP   1 mg at 04/04/21 2109   senna-docusate (Senokot-S) tablet 2 tablet  2 tablet Oral QHS Rodolph Bong, MD        Musculoskeletal: Strength & Muscle Tone: within normal limits Gait & Station:  did not witness Patient leans: N/A  Psychiatric Specialty Exam: Physical Exam Vitals and nursing note reviewed.  Constitutional:      Appearance: Normal appearance.  HENT:     Head: Normocephalic.     Nose: Nose normal.  Pulmonary:     Effort: Pulmonary effort is normal.  Neurological:     General: No focal deficit present.     Mental Status: She is alert.  Psychiatric:        Attention and Perception: Perception normal.        Mood and Affect: Mood and  affect normal.        Speech: Speech normal.        Behavior: Behavior is cooperative.        Thought Content: Thought content normal.        Cognition and Memory: Memory normal.        Judgment: Judgment is impulsive.    Review of Systems  Psychiatric/Behavioral: Negative.    All other systems reviewed and are negative.  Blood pressure (!) 158/77, pulse 66, temperature 98.4 F (36.9 C), temperature source Oral, resp. rate 16, height  (1.676 m), weight 104.3 kg, SpO2 99 %.Body mass index is 37.11 kg/m.  General Appearance: Casual  Eye Contact:  Fair  Speech:  Normal Rate  Volume:  Normal  Mood:  Euthymic  Affect:  Congruent  Thought Process:  Coherent and Linear  Orientation:  Full (Time, Place, and Person)  Thought Content:  Logical  Suicidal Thoughts:  No  Homicidal Thoughts:  No  Memory:  Immediate;   Fair Recent;   Fair  Judgement:  Intact  Insight:  Present  Psychomotor Activity:  Normal  Concentration:  Concentration: Fair and Attention Span: Fair  Recall:  [employer] of  Knowledge:  Fair  Language:  Fair  Akathisia:  No  Handed:  Right  AIMS (if indicated):     Assets:  Desire for Improvement Housing Social Support  ADL's:  Impaired  Cognition:  Impaired,  Mild  Sleep:      Physical Exam: Physical Exam Vitals and nursing note reviewed.  Constitutional:      Appearance: Normal appearance.  HENT:     Head: Normocephalic.     Nose: Nose normal.  Pulmonary:     Effort: Pulmonary effort is normal.  Neurological:     General: No focal deficit present.     Mental Status: She is alert.  Psychiatric:        Attention and Perception: Perception normal.        Mood and Affect: Mood and affect normal.        Speech: Speech normal.        Behavior: Behavior is cooperative.        Thought Content: Thought content normal.        Cognition and Memory: Memory normal.        Judgment: Judgment is impulsive.   Review of Systems  Psychiatric/Behavioral:  Negative.    All other systems reviewed and are negative. Blood pressure (!) 158/77, pulse 66, temperature 98.4 F (36.9 C), temperature source Oral, resp. rate 16, height  (1.676 m), weight 104.3 kg, SpO2 99 %. Body mass index is 37.11 kg/m.  Treatment Plan Summary:  75 year old female with a history of bipolar disorder, was previously taking Depakote and Risperdal.  She presented to Hospital for generalized weakness x2 weeks, encephalopathy from [facility] rehabilitation.  Patient does have similar presentation, in which she discontinued her medication in April, and presented with similar symptoms.  At that time she also presented with altered mental status and aggressive behavior, and was stabilized on Depakote and Risperdal.  Today patient seems to be cooperative, more awake.  She denies any delusions, suicidal thoughts, and appears to be showing febrile boll response to current psychiatric medications.  Patient's current presentation is likely due to encephalopathy, and rapid discontinuation of her psychotropic medications has likely caused a relapse of her bipolar disorder.    Recommendations: -Continue Depakote 500 mg BID for aggression/agitation. Depakote level to be ordered on 04/07/21. Order placed.  -Continue Risperdal 1 mg twice daily for psychosis, aggression, and agitation.   Psychiatry will continue to follow until Depakote level has resulted.  As noted above it is very important to refrain from rapid discontinuation of her psychotropic medications as it can cause a relapse of her bipolar symptoms.    Patient continues to show modest improvement in mentation, and evidence of stabilization from a psychiatric standpoint. Continue current medications as directed.  There is no current nursing documentation to describe behaviors listed above.  Disposition: No evidence of imminent risk to self or others at present.   Supportive therapy provided about ongoing stressors.  Maryagnes Amos, FNP 04/05/2021 10:09 AM

## 2021-04-06 LAB — CBC WITH DIFFERENTIAL/PLATELET
Abs Immature Granulocytes: 0.1 10*3/uL — ABNORMAL HIGH (ref 0.00–0.07)
Basophils Absolute: 0 10*3/uL (ref 0.0–0.1)
Basophils Relative: 0 %
Eosinophils Absolute: 0.2 10*3/uL (ref 0.0–0.5)
Eosinophils Relative: 1 %
HCT: 33.5 % — ABNORMAL LOW (ref 36.0–46.0)
Hemoglobin: 10.1 g/dL — ABNORMAL LOW (ref 12.0–15.0)
Immature Granulocytes: 1 %
Lymphocytes Relative: 16 %
Lymphs Abs: 2.5 10*3/uL (ref 0.7–4.0)
MCH: 28.3 pg (ref 26.0–34.0)
MCHC: 30.1 g/dL (ref 30.0–36.0)
MCV: 93.8 fL (ref 80.0–100.0)
Monocytes Absolute: 1.3 10*3/uL — ABNORMAL HIGH (ref 0.1–1.0)
Monocytes Relative: 8 %
Neutro Abs: 11.4 10*3/uL — ABNORMAL HIGH (ref 1.7–7.7)
Neutrophils Relative %: 74 %
Platelets: 196 10*3/uL (ref 150–400)
RBC: 3.57 MIL/uL — ABNORMAL LOW (ref 3.87–5.11)
RDW: 14.8 % (ref 11.5–15.5)
WBC: 15.5 10*3/uL — ABNORMAL HIGH (ref 4.0–10.5)
nRBC: 0 % (ref 0.0–0.2)

## 2021-04-06 LAB — BASIC METABOLIC PANEL
Anion gap: 9 (ref 5–15)
BUN: 25 mg/dL — ABNORMAL HIGH (ref 8–23)
CO2: 38 mmol/L — ABNORMAL HIGH (ref 22–32)
Calcium: 8.4 mg/dL — ABNORMAL LOW (ref 8.9–10.3)
Chloride: 94 mmol/L — ABNORMAL LOW (ref 98–111)
Creatinine, Ser: 0.67 mg/dL (ref 0.44–1.00)
GFR, Estimated: >60 mL/min (ref >60)
Glucose, Bld: 226 mg/dL — ABNORMAL HIGH (ref 70–99)
Potassium: 3.6 mmol/L (ref 3.5–5.1)
Sodium: 141 mmol/L (ref 135–145)

## 2021-04-06 LAB — MAGNESIUM: Magnesium: 1.9 mg/dL (ref 1.7–2.4)

## 2021-04-06 LAB — GLUCOSE, CAPILLARY
Glucose-Capillary: 142 mg/dL — ABNORMAL HIGH (ref 70–99)
Glucose-Capillary: 188 mg/dL — ABNORMAL HIGH (ref 70–99)
Glucose-Capillary: 244 mg/dL — ABNORMAL HIGH (ref 70–99)

## 2021-04-06 LAB — PHOSPHORUS: Phosphorus: 3.1 mg/dL (ref 2.5–4.6)

## 2021-04-06 MED ORDER — PREDNISONE 5 MG PO TABS
30.0000 mg | ORAL_TABLET | Freq: Every day | ORAL | Status: DC
  Administered 2021-04-07: 30 mg via ORAL
  Filled 2021-04-06: qty 1

## 2021-04-06 NOTE — Progress Notes (Signed)
Patient ID: Becky Hendricks, female   DOB: January 12, 1946, 75 y.o.   MRN: [id number]  PROGRESS NOTE    Becky Hendricks  VQQ:[id number] DOB: [birth year] DOA: 03/29/2021 PCP: Roderic Scarce, MD   Brief Narrative:  75 year old female with past medical history of hypertension, diabetes mellitus type 2, paroxysmal A. fib, obesity, chronic diastolic heart failure, seizure disorder, bipolar disorder presented on 03/29/2021 for acute loss of consciousness and respiratory stress from rehab facility.  She was found to be extremely hypoxic and acidotic and required emergent intubation.  There was a concern for pneumonia and UTI on admission as a source of sepsis.  CT of the head was negative for acute intracranial process but showed possible sinusitis.  CT of the abdomen/pelvis showed large amount of stool in distal sigmoid colon.  She was subsequently extubated and reintubated and then we extubated.  Assessment & Plan:   Acute hypoxic hypercapnic respiratory failure Pneumonia: Present on admission -Patient was intubated on admission; subsequently extubated and reintubated and extubated again -Currently requiring BiPAP at night and 2 to 3 L oxygen via nasal cannula during daytime. -PCCM signed off on 04/04/2021 recommending nightly BiPAP -She completed a 7-day course of antibiotic therapy on 04/05/2021 -2D echo showed EF of 60 to 65% with no wall motion abnormality; moderately elevated pulmonary artery systolic pressure -PCCM recommended to taper steroids because of worsening agitation.  Probable OSA/OHS -PCCM recommends nightly BiPAP and outpatient sleep study  Septic shock: Present on admission Proteus mirabilis UTI: Present on admission -Currently on Ancef.  Off pressors. -Sepsis has resolved.  Currently hemodynamically stable. -Completed 7-day course of antibiotics on 04/05/2021  Leukocytosis -Possibly from above.  Improving.  Monitor.  Acute metabolic and toxic encephalopathy Seizure disorder Bipolar  disorder -Possibly from a combination of hypercapnia and steroids -CT and MRI of the brain were negative for acute abnormalities. -Limited EEG suggestive of moderate to severe diffuse encephalopathy, nonspecific, no seizures or definite epileptiform discharges were seen throughout the recording. -Ammonia level within normal limits.  -Mental status improving with BiPAP use and resume some of Risperdal. -Psychiatry following: Continue Depakote and risperidone.  Still intermittently agitated.  Hypertension -Continue Coreg  Hyperlipidemia -Continue statin  Paroxysmal A. Fib -Currently rate controlled.  Continue Coreg and Eliquis.  Outpatient follow-up with cardiology  Diabetes mellitus type 2 -A1c 7.  Continue CBGs with SSI.  Generalized conditioning -Patient will need to go back to SNF. -Overall prognosis is guarded to poor.  Patient has had palliative care discussion during this hospitalization.  Remains full code.  Constipation -Continue bowel regimen  DVT prophylaxis: Eliquis Code Status: Full Family Communication: Brother at bedside Disposition Plan: Status is: Inpatient  Remains inpatient appropriate because:Inpatient level of care appropriate due to severity of illness  Dispo: The patient is from: SNF              Anticipated d/c is to: SNF              Patient currently is not medically stable to d/c.   Difficult to place patient No  Consultants: PCCM/psychiatry/palliative care  Procedures: EEG/2D echo  Antimicrobials:  Anti-infectives (From admission, onward)    Start     Dose/Rate Route Frequency Ordered Stop   04/02/21 1400  ceFAZolin (ANCEF) IVPB 1 g/50 mL premix        1 g 100 mL/hr over 30 Minutes Intravenous Every 8 hours 04/02/21 1121 04/05/21 2306   03/30/21 1200  cefTRIAXone (ROCEPHIN) 2 g in sodium chloride 0.9 % 100 mL  IVPB  Status:  Discontinued        2 g 200 mL/hr over 30 Minutes Intravenous Every 24 hours 03/30/21 1121 04/02/21 1121   03/30/21  1000  vancomycin (VANCOREADY) IVPB 1500 mg/300 mL  Status:  Discontinued        1,500 mg 150 mL/hr over 120 Minutes Intravenous Every 24 hours 03/29/21 2054 03/31/21 0924   03/30/21 0200  piperacillin-tazobactam (ZOSYN) IVPB 3.375 g  Status:  Discontinued        3.375 g 12.5 mL/hr over 240 Minutes Intravenous Every 8 hours 03/29/21 2054 03/30/21 1121   03/30/21 0027  azithromycin (ZITHROMAX) 500 mg in sodium chloride 0.9 % 250 mL IVPB  Status:  Discontinued        500 mg 250 mL/hr over 60 Minutes Intravenous Every 24 hours 03/30/21 0027 03/30/21 0424   03/29/21 1915  vancomycin (VANCOCIN) IVPB 1000 mg/200 mL premix        1,000 mg 100 mL/hr over 120 Minutes Intravenous  Once 03/29/21 1914 03/29/21 2313   03/29/21 1915  piperacillin-tazobactam (ZOSYN) IVPB 3.375 g        3.375 g 100 mL/hr over 30 Minutes Intravenous  Once 03/29/21 1914 03/29/21 2324        Subjective: Patient seen and examined at bedside.  Patient seen and examined at bedside.  No overnight fever, vomiting, seizures reported.  Had episodes of intermittent agitation earlier this morning as per nursing staff. Objective: Vitals:   04/05/21 2215 04/06/21 0551 04/06/21 0744 04/06/21 0800  BP:  132/61  140/62  Pulse: 88 (!) 53  90  Resp: 20 20  16   Temp:  97.7 F (36.5 C)    TempSrc:  Oral    SpO2: 97% 97% 97% 100%  Weight:  106 kg    Height:        Intake/Output Summary (Last 24 hours) at 04/06/2021 0830 Last data filed at 04/06/2021 0556 Gross per 24 hour  Intake 1278 ml  Output 150 ml  Net 1128 ml    Filed Weights   04/04/21 0500 04/05/21 0500 04/06/21 0551  Weight: 108 kg 104.3 kg 106 kg    Examination:  General exam: No acute distress currently.  Looks chronically ill and deconditioned.  Still on 3 L oxygen via nasal cannula Respiratory system: Decreased breath sounds at bases bilaterally with scattered crackles  cardiovascular system: Intermittently bradycardic; S1-S2 heard  gastrointestinal system:  Abdomen is slightly distended, soft and nontender.  Bowel sounds are heard Extremities: Mild lower extremity edema present; no clubbing  Central nervous system: Still  slow to respond to questions.  Poor historian.  no focal neurological deficits.  Moves extremities  skin: No obvious ecchymosis/rashes  psychiatry: Looks more pleasant this morning and participates in conversation a little more.  Data Reviewed: I have personally reviewed following labs and imaging studies  CBC: Recent Labs  Lab 04/02/21 0304 04/03/21 0311 04/04/21 0553 04/05/21 0323 04/06/21 0428  WBC 10.7* 6.8 10.0 20.2* 15.5*  NEUTROABS  --  5.4 6.2  --  11.4*  HGB 10.6* 10.5* 10.6* 10.3* 10.1*  HCT 35.9* 34.8* 35.5* 33.8* 33.5*  MCV 94.2 92.8 93.2 93.1 93.8  PLT 191 203 210 209 196    Basic Metabolic Panel: Recent Labs  Lab 04/01/21 0242 04/02/21 0304 04/03/21 0311 04/03/21 1333 04/04/21 0553 04/05/21 0323 04/06/21 0428  NA 138 135 134*  --  135 135 141  K 3.8 3.9 4.4  --  4.0 3.6 3.6  CL 101  96* 96*  --  94* 95* 94*  CO2 29 32 31  --  34* 35* 38*  GLUCOSE 115* 117* 291*  --  193* 179* 226*  BUN 14 11 14   --  23 26* 25*  CREATININE 0.47 0.50 0.52  --  0.71 0.66 0.67  CALCIUM 8.2* 8.2* 8.5*  --  8.9 8.5* 8.4*  MG 1.9  --   --  2.2 2.0  --  1.9  PHOS  --   --   --  2.4* 3.1 2.4* 3.1    GFR: Estimated Creatinine Clearance: 77.1 mL/min (by C-G formula based on SCr of 0.67 mg/dL). Liver Function Tests: Recent Labs  Lab 04/05/21 0323  AST 9*  ALT 8  ALKPHOS 27*  BILITOT 0.4  PROT 5.0*  ALBUMIN 2.3*    No results for input(s): LIPASE, AMYLASE in the last 168 hours. Recent Labs  Lab 04/02/21 1014  AMMONIA 28    Coagulation Profile: No results for input(s): INR, PROTIME in the last 168 hours. Cardiac Enzymes: No results for input(s): CKTOTAL, CKMB, CKMBINDEX, TROPONINI in the last 168 hours.  BNP (last 3 results) No results for input(s): PROBNP in the last 8760 hours. HbA1C: No  results for input(s): HGBA1C in the last 72 hours. CBG: Recent Labs  Lab 04/04/21 1634 04/05/21 0828 04/05/21 1303 04/05/21 1647 04/06/21 0735  GLUCAP 175* 118* 177* 229* 142*    Lipid Profile: No results for input(s): CHOL, HDL, LDLCALC, TRIG, CHOLHDL, LDLDIRECT in the last 72 hours. Thyroid Function Tests: No results for input(s): TSH, T4TOTAL, FREET4, T3FREE, THYROIDAB in the last 72 hours. Anemia Panel: No results for input(s): VITAMINB12, FOLATE, FERRITIN, TIBC, IRON, RETICCTPCT in the last 72 hours. Sepsis Labs: Recent Labs  Lab 04/03/21 1333 04/04/21 0553 04/05/21 0323  PROCALCITON  --  <0.10 <0.10  LATICACIDVEN 2.8* 1.4  --      Recent Results (from the past 240 hour(s))  Resp Panel by RT-PCR (Flu A&B, Covid) Nasopharyngeal Swab     Status: None   Collection Time: 03/29/21  4:12 PM   Specimen: Nasopharyngeal Swab; Nasopharyngeal(NP) swabs in vial transport medium  Result Value Ref Range Status   SARS Coronavirus 2 by RT PCR NEGATIVE NEGATIVE Final    Comment: (NOTE) SARS-CoV-2 target nucleic acids are NOT DETECTED.  The SARS-CoV-2 RNA is generally detectable in upper respiratory specimens during the acute phase of infection. The lowest concentration of SARS-CoV-2 viral copies this assay can detect is 138 copies/mL. A negative result does not preclude SARS-Cov-2 infection and should not be used as the sole basis for treatment or other patient management decisions. A negative result may occur with  improper specimen collection/handling, submission of specimen other than nasopharyngeal swab, presence of viral mutation(s) within the areas targeted by this assay, and inadequate number of viral copies(<138 copies/mL). A negative result must be combined with clinical observations, patient history, and epidemiological information. The expected result is Negative.  Fact Sheet for Patients:  BloggerCourse.comhttps://www.fda.gov/media/152166/download  Fact Sheet for Healthcare  Providers:  SeriousBroker.ithttps://www.fda.gov/media/152162/download  This test is no t yet approved or cleared by the [place] FDA and  has been authorized for detection and/or diagnosis of SARS-CoV-2 by FDA under an Emergency Use Authorization (EUA). This EUA will remain  in effect (meaning this test can be used) for the duration of the COVID-19 declaration under Section 564(b)(1) of the Act, 21 U.S.C.section 360bbb-3(b)(1), unless the authorization is terminated  or revoked sooner.       Influenza  A by PCR NEGATIVE NEGATIVE Final   Influenza B by PCR NEGATIVE NEGATIVE Final    Comment: (NOTE) The Xpert Xpress SARS-CoV-2/FLU/RSV plus assay is intended as an aid in the diagnosis of influenza from Nasopharyngeal swab specimens and should not be used as a sole basis for treatment. Nasal washings and aspirates are unacceptable for Xpert Xpress SARS-CoV-2/FLU/RSV testing.  Fact Sheet for Patients: BloggerCourse.com  Fact Sheet for Healthcare Providers: SeriousBroker.it  This test is not yet approved or cleared by the [place] FDA and has been authorized for detection and/or diagnosis of SARS-CoV-2 by FDA under an Emergency Use Authorization (EUA). This EUA will remain in effect (meaning this test can be used) for the duration of the COVID-19 declaration under Section 564(b)(1) of the Act, 21 U.S.C. section 360bbb-3(b)(1), unless the authorization is terminated or revoked.  Performed at [facility], [address] [place], [place] [address]   Culture, blood (single)     Status: None   Collection Time: 03/29/21  4:46 PM   Specimen: BLOOD  Result Value Ref Range Status   Specimen Description   Final    BLOOD BLOOD RIGHT FOREARM Performed at [facility], [address], [place], [place] [address]    Special Requests   Final    BOTTLES DRAWN AEROBIC AND ANAEROBIC Blood Culture adequate volume Performed at  [facility], [address], [place], [place] [address]    Culture   Final    NO GROWTH 5 DAYS Performed at [facility], [address], [place], [place] [address]    Report Status 04/03/2021 FINAL  Final  Urine Culture     Status: Abnormal   Collection Time: 03/29/21  8:46 PM   Specimen: Urine, Clean Catch  Result Value Ref Range Status   Specimen Description   Final    URINE, CLEAN CATCH Performed at [facility], [address] [place], [place] [address]    Special Requests   Final    NONE Performed at [facility], [address] [place], [place] [address]    Culture >=100,000 COLONIES/mL PROTEUS MIRABILIS (A)  Final   Report Status 04/01/2021 FINAL  Final   Organism ID, Bacteria PROTEUS MIRABILIS (A)  Final      Susceptibility   Proteus mirabilis - MIC*    AMPICILLIN <=2 SENSITIVE Sensitive     CEFAZOLIN <=4 SENSITIVE Sensitive     CEFEPIME <=0.12 SENSITIVE Sensitive     CEFTRIAXONE <=0.25 SENSITIVE Sensitive     CIPROFLOXACIN <=0.25 SENSITIVE Sensitive     GENTAMICIN <=1 SENSITIVE Sensitive     IMIPENEM 1 SENSITIVE Sensitive     NITROFURANTOIN 128 RESISTANT Resistant     TRIMETH/SULFA <=20 SENSITIVE Sensitive     AMPICILLIN/SULBACTAM <=2 SENSITIVE Sensitive     PIP/TAZO <=4 SENSITIVE Sensitive     * >=100,000 COLONIES/mL PROTEUS MIRABILIS  Culture, blood (single)     Status: None   Collection Time: 03/29/21  9:30 PM   Specimen: BLOOD  Result Value Ref Range Status   Specimen Description   Final    BLOOD RIGHT ANTECUBITAL Performed at [facility], [address], [place], [place] [address]    Special Requests   Final    BOTTLES DRAWN AEROBIC AND ANAEROBIC Blood Culture adequate volume Performed at [facility], [address], [place], [place] [address]    Culture   Final    NO GROWTH 5  DAYS Performed at [facility], [address] [place],  [place] [address]    Report Status 04/03/2021 FINAL  Final  MRSA Next Gen by PCR, Nasal     Status: Abnormal   Collection Time: 03/30/21 12:00 AM   Specimen: Nasal Mucosa; Nasal Swab  Result Value Ref Range Status   MRSA by PCR Next Gen DETECTED (A) NOT DETECTED Final    Comment: RESULT CALLED TO, READ BACK BY AND VERIFIED WITH: KAREN, RN @ 0403 ON 03/30/21 C VARNER (NOTE) The GeneXpert MRSA Assay (FDA approved for NASAL specimens only), is one component of a comprehensive MRSA colonization surveillance program. It is not intended to diagnose MRSA infection nor to guide or monitor treatment for MRSA infections. Test performance is not FDA approved in patients less than 19 years old. Performed at [facility], [address] [place], [place] [address]   Culture, blood (x 2)     Status: None   Collection Time: 03/30/21  1:53 AM   Specimen: BLOOD  Result Value Ref Range Status   Specimen Description   Final    BLOOD BLOOD LEFT HAND Performed at [facility], [address], [place], [place] [address]    Special Requests   Final    BOTTLES DRAWN AEROBIC ONLY Blood Culture adequate volume Performed at [facility], [address], [place], [place] [address]    Culture   Final    NO GROWTH 5 DAYS Performed at [facility], [address] [place], [place] [address]    Report Status 04/04/2021 FINAL  Final  Culture, blood (x 2)     Status: None   Collection Time: 03/30/21  3:11 AM   Specimen: BLOOD  Result Value Ref Range Status   Specimen Description   Final    BLOOD BLOOD LEFT HAND Performed at [facility], [address], [place], [place] [address]    Special Requests   Final    BOTTLES DRAWN AEROBIC ONLY Blood Culture adequate volume Performed at [facility], [address], [place], [place] [address]    Culture   Final    NO GROWTH 5 DAYS Performed at [facility],  [address], [place], [place] [address]    Report Status 04/04/2021 FINAL  Final          Radiology Studies: No results found.      Scheduled Meds:  apixaban  5 mg Oral BID   atorvastatin  10 mg Oral Daily   carvedilol  6.25 mg Oral BID WC   divalproex  500 mg Oral Q12H   ferrous sulfate  325 mg Oral Daily   fluticasone  2 spray Each Nare Daily   insulin aspart  0-9 Units Subcutaneous TID WC   loratadine  10 mg Oral Daily   mouth rinse  15 mL Mouth Rinse BID   pantoprazole  40 mg Oral Daily   phosphorus  250 mg Oral BID   polyethylene glycol  17 g Oral BID   predniSONE  40 mg Oral Q breakfast   risperiDONE  1 mg Oral BID   senna-docusate  2 tablet Oral QHS   Continuous Infusions:  sodium chloride 10 mL/hr at 04/05/21 0300   sodium chloride Stopped (04/02/21 0137)          Glade Lloyd, MD [facility] 04/06/2021, 8:30 AM

## 2021-04-06 NOTE — NC FL2 (Signed)
MEDICAID FL2 LEVEL OF CARE SCREENING TOOL     IDENTIFICATION  Patient Name: Becky Hendricks Birthdate: June 20, 1946 Sex: female Admission Date (Current Location): 03/29/2021  [facility] and [place] Number:  Producer, television/film/video and Address:  [facility],  [address], [place] [address]      Provider Number: [id number]  Attending Physician Name and Address:  Glade Lloyd, MD  Relative Name and Phone Number:  [name] [place](brother) [phone]    Current Level of Care: Hospital Recommended Level of Care: Nursing Facility Prior Approval Number:    Date Approved/Denied:   PASRR Number:    Discharge Plan: Other (Comment) (LTC)    Current Diagnoses: Patient Active Problem List   Diagnosis Date Noted   Acute on chronic respiratory failure with hypoxia and hypercapnia (HCC)    Acute respiratory failure with hypoxia (HCC) 03/29/2021   Hyponatremia 03/29/2021   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   Pneumonia of lower lobe due to infectious organism 03/29/2021   Colitis 03/29/2021   Acute respiratory failure (HCC) 03/29/2021   Bipolar affective disorder, currently manic, mild (HCC) 11/19/2020   Constipation 11/18/2020   Encephalopathy 11/18/2020   MRSA bacteremia    SIRS (systemic inflammatory response syndrome) (HCC) 11/17/2020   Nausea and vomiting 11/17/2020   Mixed diabetic hyperlipidemia associated with type 2 diabetes mellitus (HCC) 11/17/2020   Type 2 diabetes mellitus with hyperglycemia, with long-term current use of insulin (HCC) 11/17/2020   Immunization due 07/26/2020   Psychosis (HCC)    Bipolar disorder (HCC) 04/27/2020   Fall 02/11/2020   Chronic diastolic CHF (congestive heart failure) (HCC) 01/22/2020   Drug rash    Acute osteomyelitis of pelvic region and thigh (HCC)    Palliative care by specialist    Goals of care, counseling/discussion    Edema of upper extremity    Hyperphosphatemia    Cardiac arrest (HCC)    Sacral  wound    Hypoxia    Severe sepsis (HCC) 12/03/2019   Abscess of multiple sites of buttock 12/03/2019   AF (paroxysmal atrial fibrillation) (HCC) 12/03/2019   Metabolic encephalopathy 09/15/2019   Urinary tract infection without hematuria 09/15/2019   Pressure injury of skin 09/10/2019   Morbid obesity with BMI of 40.0-44.9, adult (HCC) 02/27/2018    Orientation RESPIRATION BLADDER Height & Weight     Self, Time, Situation, Place  O2 Continent Weight: 106 kg Height:  5\' 6"  (167.6 cm)  BEHAVIORAL SYMPTOMS/MOOD NEUROLOGICAL BOWEL NUTRITION STATUS      Continent Diet (CHO MOD)  AMBULATORY STATUS COMMUNICATION OF NEEDS Skin   Total Care Verbally Normal                       Personal Care Assistance Level of Assistance  Bathing, Feeding, Total care Bathing Assistance: Maximum assistance Feeding assistance: Maximum assistance   Total Care Assistance: Maximum assistance   Functional Limitations Info  Sight, Hearing, Speech Sight Info: Impaired (eyeglasses) Hearing Info: Adequate Speech Info: Adequate    SPECIAL CARE FACTORS FREQUENCY                       Contractures Contractures Info: Not present    Additional Factors Info  Code Status, Allergies, Psychotropic, Insulin Sliding Scale Code Status Info:  (Full) Allergies Info:  (Chlorhexidine gluconate/vancomycin/Acetazolamide Er) Psychotropic Info:  (Depakote, risperdal see MAR) Insulin Sliding Scale Info:  (SSI)       Current Medications (04/06/2021):  This is  the current hospital active medication list Current Facility-Administered Medications  Medication Dose Route Frequency Provider Last Rate Last Admin   0.9 %  sodium chloride infusion   Intravenous PRN Hunsucker, Lesia Sago, MD 10 mL/hr at 04/05/21 0300 Infusion Verify at 04/05/21 0300   0.9 %  sodium chloride infusion  250 mL Intravenous Continuous Karl Ito, MD   Stopped at 04/02/21 0277   acetaminophen (TYLENOL) tablet 650 mg  650 mg Oral Q6H  PRN Canary Brim L, NP   650 mg at 04/01/21 4128   Or   acetaminophen (TYLENOL) suppository 650 mg  650 mg Rectal Q6H PRN Jeanella Craze, NP       apixaban (ELIQUIS) tablet 5 mg  5 mg Oral BID Ollis, Brandi L, NP   5 mg at 04/06/21 1025   atorvastatin (LIPITOR) tablet 10 mg  10 mg Oral Daily Doutova, Anastassia, MD   10 mg at 04/06/21 1025   carvedilol (COREG) tablet 6.25 mg  6.25 mg Oral BID WC Rodolph Bong, MD   6.25 mg at 04/06/21 0802   divalproex (DEPAKOTE) DR tablet 500 mg  500 mg Oral Q12H Rodolph Bong, MD   500 mg at 04/06/21 1025   ferrous sulfate tablet 325 mg  325 mg Oral Daily Rodolph Bong, MD   325 mg at 04/06/21 1025   fluticasone (FLONASE) 50 MCG/ACT nasal spray 2 spray  2 spray Each Nare Daily Rodolph Bong, MD   2 spray at 04/06/21 1028   insulin aspart (novoLOG) injection 0-9 Units  0-9 Units Subcutaneous TID WC Rodolph Bong, MD   2 Units at 04/06/21 1150   levalbuterol (XOPENEX) nebulizer solution 0.63 mg  0.63 mg Nebulization Q6H PRN Rodolph Bong, MD       loratadine (CLARITIN) tablet 10 mg  10 mg Oral Daily Rodolph Bong, MD   10 mg at 04/06/21 1025   MEDLINE mouth rinse  15 mL Mouth Rinse BID Hunsucker, Lesia Sago, MD   15 mL at 04/06/21 1028   metoprolol tartrate (LOPRESSOR) injection 5 mg  5 mg Intravenous Q2H PRN Rodolph Bong, MD   5 mg at 04/04/21 2225   ondansetron (ZOFRAN) injection 4 mg  4 mg Intravenous Q6H PRN Kirtland Bouchard, MD       pantoprazole (PROTONIX) EC tablet 40 mg  40 mg Oral Daily Rodolph Bong, MD   40 mg at 04/06/21 1025   polyethylene glycol (MIRALAX / GLYCOLAX) packet 17 g  17 g Oral BID Rodolph Bong, MD   17 g at 04/06/21 1024   [START ON 04/07/2021] predniSONE (DELTASONE) tablet 30 mg  30 mg Oral Q breakfast Alekh, Kshitiz, MD       risperiDONE (RISPERDAL) tablet 1 mg  1 mg Oral BID Maryagnes Amos, FNP   1 mg at 04/06/21 1025   senna-docusate (Senokot-S) tablet 2 tablet  2  tablet Oral QHS Rodolph Bong, MD         Discharge Medications: Please see discharge summary for a list of discharge medications.  Relevant Imaging Results:  Relevant Lab Results:   Additional Information ss#[id number]  Anchor Dwan, Olegario Messier, [place]

## 2021-04-06 NOTE — TOC Initial Note (Addendum)
Transition of Care [facility]) - Initial/Assessment Note    Patient Details  Name: Becky Hendricks MRN: [id number] Date of Birth: 07-Mar-1946  Transition of Care [facility]) CM/SW Contact:    Lanier Clam, RN Phone Number: 04/06/2021, 10:20 AM  Clinical Narrative: confirmed from [facility] rep Lawerance Cruel following-she will f/u on covid vaccine-not listed,& brother says she had vaccine @ [name]-d/c plan to return.  Psych-no danger to self or others-meds adjusted.                Expected Discharge Plan: Long Term Nursing Home Barriers to Discharge: Continued Medical Work up   Patient Goals and CMS Choice Patient states their goals for this hospitalization and ongoing recovery are:: return back to [facility] Medicare.gov Compare Post Acute Care list provided to:: Patient Represenative (must comment) [name] brother [phone]) Choice offered to / list presented to : Sibling  Expected Discharge Plan and Services Expected Discharge Plan: Long Term Nursing Home   Discharge Planning Services: CM Consult Post Acute Care Choice: Nursing Home Living arrangements for the past 2 months: Post-Acute Facility                                      Prior Living Arrangements/Services Living arrangements for the past 2 months: Post-Acute Facility Lives with:: Facility Resident Patient language and need for interpreter reviewed:: Yes Do you feel safe going back to the place where you live?: Yes      Need for Family Participation in Patient Care: No (Comment) Care giver support system in place?: Yes (comment)   Criminal Activity/Legal Involvement Pertinent to Current Situation/Hospitalization: No - Comment as needed  Activities of Daily Living Home Assistive Devices/Equipment: Other (Comment) (pt unable to verbalize what equipment at the facility that she uses) ADL Screening (condition at time of admission) Patient's cognitive ability adequate to safely complete daily activities?:  No Is the patient deaf or have difficulty hearing?: No Does the patient have difficulty seeing, even when wearing glasses/contacts?: No Does the patient have difficulty concentrating, remembering, or making decisions?: Yes Patient able to express need for assistance with ADLs?: No Does the patient have difficulty dressing or bathing?: Yes Independently performs ADLs?: No Communication: Needs assistance Is this a change from baseline?: Change from baseline, expected to last <3 days Dressing (OT): Needs assistance Is this a change from baseline?: Pre-admission baseline Grooming: Needs assistance Is this a change from baseline?: Pre-admission baseline Feeding: Needs assistance Is this a change from baseline?: Change from baseline, expected to last <3 days Bathing: Needs assistance Is this a change from baseline?: Pre-admission baseline Toileting: Needs assistance Is this a change from baseline?: Pre-admission baseline In/Out Bed: Needs assistance Is this a change from baseline?: Pre-admission baseline Walks in Home: Needs assistance Is this a change from baseline?: Pre-admission baseline Does the patient have difficulty walking or climbing stairs?: Yes Weakness of Legs: Both Weakness of Arms/Hands: Both  Permission Sought/Granted Permission sought to share information with : Case Manager Permission granted to share information with : Yes, Verbal Permission Granted  Share Information with NAME: Case Manager     Permission granted to share info w Relationship: [name] brother [phone]     Emotional Assessment Appearance:: Appears stated age Attitude/Demeanor/Rapport: Gracious Affect (typically observed): Accepting Orientation: : Oriented to Self Alcohol / Substance Use: Not Applicable Psych Involvement: Yes (comment)  Admission diagnosis:  Acute respiratory failure (HCC) [  J96.00] Metabolic encephalopathy [G93.41] Colitis [K52.9] Encounter for orogastric (OG) tube placement  [Z46.59] Acute respiratory failure with hypoxia (HCC) [J96.01] Acute respiratory failure with hypoxia and hypercapnia (HCC) [J96.01, J96.02] Urinary tract infection without hematuria, site unspecified [N39.0] Pneumonia of lower lobe due to infectious organism, unspecified laterality [J18.9] Patient Active Problem List   Diagnosis Date Noted   Acute on chronic respiratory failure with hypoxia and hypercapnia (HCC)    Acute respiratory failure with hypoxia (HCC) 03/29/2021   Hyponatremia 03/29/2021   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   Pneumonia of lower lobe due to infectious organism 03/29/2021   Colitis 03/29/2021   Acute respiratory failure (HCC) 03/29/2021   Bipolar affective disorder, currently manic, mild (HCC) 11/19/2020   Constipation 11/18/2020   Encephalopathy 11/18/2020   MRSA bacteremia    SIRS (systemic inflammatory response syndrome) (HCC) 11/17/2020   Nausea and vomiting 11/17/2020   Mixed diabetic hyperlipidemia associated with type 2 diabetes mellitus (HCC) 11/17/2020   Type 2 diabetes mellitus with hyperglycemia, with long-term current use of insulin (HCC) 11/17/2020   Immunization due 07/26/2020   Psychosis (HCC)    Bipolar disorder (HCC) 04/27/2020   Fall 02/11/2020   Chronic diastolic CHF (congestive heart failure) (HCC) 01/22/2020   Drug rash    Acute osteomyelitis of pelvic region and thigh (HCC)    Palliative care by specialist    Goals of care, counseling/discussion    Edema of upper extremity    Hyperphosphatemia    Cardiac arrest (HCC)    Sacral wound    Hypoxia    Severe sepsis (HCC) 12/03/2019   Abscess of multiple sites of buttock 12/03/2019   AF (paroxysmal atrial fibrillation) (HCC) 12/03/2019   Metabolic encephalopathy 09/15/2019   Urinary tract infection without hematuria 09/15/2019   Pressure injury of skin 09/10/2019   Morbid obesity with BMI of 40.0-44.9, adult (HCC) 02/27/2018   PCP:  Roderic Scarce, MD Pharmacy:   [facility] -  [place], [place] - [address] [place] [address] [place] [address] Phone: [phone] Fax: [phone]  [facility] - [place], [place] - [address] [place] [address] Phone: [phone] Fax: [phone]     Social Determinants of Health (SDOH) Interventions    Readmission Risk Interventions No flowsheet data found.

## 2021-04-06 NOTE — Plan of Care (Signed)
  Problem: Safety: Goal: Non-violent Restraint(s) Outcome: Progressing   Problem: Skin Integrity: Goal: Risk for impaired skin integrity will decrease Outcome: Progressing   Problem: Clinical Measurements: Goal: Will remain free from infection Outcome: Progressing

## 2021-04-06 NOTE — Progress Notes (Signed)
Patient is constantly demanding Nurse and Nurse tech to be in the room. Pt is threatening/yelling at staff even after being cared and needs being addressed. Patient is positioned comfortably per pt request, purewick changed, and call bell is within reach.

## 2021-04-07 DIAGNOSIS — A419 Sepsis, unspecified organism: Secondary | ICD-10-CM | POA: Diagnosis not present

## 2021-04-07 DIAGNOSIS — R6520 Severe sepsis without septic shock: Secondary | ICD-10-CM | POA: Diagnosis not present

## 2021-04-07 LAB — CBC WITH DIFFERENTIAL/PLATELET
Abs Immature Granulocytes: 0.15 10*3/uL — ABNORMAL HIGH (ref 0.00–0.07)
Basophils Absolute: 0 10*3/uL (ref 0.0–0.1)
Basophils Relative: 0 %
Eosinophils Absolute: 0.5 10*3/uL (ref 0.0–0.5)
Eosinophils Relative: 3 %
HCT: 35.2 % — ABNORMAL LOW (ref 36.0–46.0)
Hemoglobin: 10.5 g/dL — ABNORMAL LOW (ref 12.0–15.0)
Immature Granulocytes: 1 %
Lymphocytes Relative: 27 %
Lymphs Abs: 3.9 10*3/uL (ref 0.7–4.0)
MCH: 28 pg (ref 26.0–34.0)
MCHC: 29.8 g/dL — ABNORMAL LOW (ref 30.0–36.0)
MCV: 93.9 fL (ref 80.0–100.0)
Monocytes Absolute: 1.4 10*3/uL — ABNORMAL HIGH (ref 0.1–1.0)
Monocytes Relative: 10 %
Neutro Abs: 8.7 10*3/uL — ABNORMAL HIGH (ref 1.7–7.7)
Neutrophils Relative %: 59 %
Platelets: 196 10*3/uL (ref 150–400)
RBC: 3.75 MIL/uL — ABNORMAL LOW (ref 3.87–5.11)
RDW: 14.7 % (ref 11.5–15.5)
WBC: 14.7 10*3/uL — ABNORMAL HIGH (ref 4.0–10.5)
nRBC: 0 % (ref 0.0–0.2)

## 2021-04-07 LAB — BASIC METABOLIC PANEL
Anion gap: 5 (ref 5–15)
BUN: 23 mg/dL (ref 8–23)
CO2: 41 mmol/L — ABNORMAL HIGH (ref 22–32)
Calcium: 8.7 mg/dL — ABNORMAL LOW (ref 8.9–10.3)
Chloride: 93 mmol/L — ABNORMAL LOW (ref 98–111)
Creatinine, Ser: 0.56 mg/dL (ref 0.44–1.00)
GFR, Estimated: >60 mL/min (ref >60)
Glucose, Bld: 151 mg/dL — ABNORMAL HIGH (ref 70–99)
Potassium: 3.9 mmol/L (ref 3.5–5.1)
Sodium: 139 mmol/L (ref 135–145)

## 2021-04-07 LAB — VALPROIC ACID LEVEL: Valproic Acid Lvl: 40 ug/mL — ABNORMAL LOW (ref 50.0–100.0)

## 2021-04-07 LAB — MAGNESIUM: Magnesium: 1.8 mg/dL (ref 1.7–2.4)

## 2021-04-07 LAB — GLUCOSE, CAPILLARY
Glucose-Capillary: 139 mg/dL — ABNORMAL HIGH (ref 70–99)
Glucose-Capillary: 213 mg/dL — ABNORMAL HIGH (ref 70–99)

## 2021-04-07 MED ORDER — POLYETHYLENE GLYCOL 3350 17 G PO PACK: 17.0000 g | PACK | Freq: Every day | ORAL | Status: DC

## 2021-04-07 MED ORDER — PANTOPRAZOLE SODIUM 40 MG PO TBEC: 40.0000 mg | DELAYED_RELEASE_TABLET | Freq: Every day | ORAL | Status: DC

## 2021-04-07 MED ORDER — PREDNISONE 20 MG PO TABS: 20.0000 mg | ORAL_TABLET | Freq: Every day | ORAL | 0 refills | Status: DC

## 2021-04-07 MED ORDER — RISPERIDONE 2 MG PO TABS: 1.0000 mg | ORAL_TABLET | Freq: Two times a day (BID) | ORAL | Status: DC

## 2021-04-07 NOTE — Progress Notes (Signed)
[facility])   Referral received from [facility] for outpatient palliative support after discharge at [facility].   [facility] will follow up after discharge to get patient onto services.     Thank you,   Gillian Scarce, BSN, RN [facility] liaison [phone]

## 2021-04-07 NOTE — TOC Transition Note (Addendum)
Transition of Care [facility]) - CM/SW Discharge Note   Patient Details  Name: Becky Hendricks MRN: [id number] Date of Birth: Jan 03, 1946  Transition of Care [facility]) CM/SW Contact:  Darleene Cleaver, LCSW Phone Number: 04/07/2021, 11:59 AM   Clinical Narrative:     Patient to be d/c'ed today to [facility] room 907.  Patient and family agreeable to plans will transport via ems RN to call report to [phone].  CSW spoke to patient's brother and he is aware that patient is discharging today.  Physician requesting palliative to follow outpatient at SNF, CSW made referral to [facility].     Final next level of care: Skilled Nursing Facility Barriers to Discharge: Barriers Resolved   Patient Goals and CMS Choice Patient states their goals for this hospitalization and ongoing recovery are:: To return to LTC at [facility] CMS Medicare.gov Compare Post Acute Care list provided to:: Patient Represenative (must comment) Choice offered to / list presented to : Sibling  Discharge Placement   Existing PASRR number confirmed : 04/06/21          Patient chooses bed at: [facility] Patient to be transferred to facility by: [facility] Name of family member notified: Patient's [name] Patient and family notified of of transfer: 04/07/21  Discharge Plan and Services   Discharge Planning Services: CM Consult Post Acute Care Choice: Nursing Home                               Social Determinants of Health (SDOH) Interventions     Readmission Risk Interventions No flowsheet data found.

## 2021-04-07 NOTE — Progress Notes (Signed)
Patient discharged back to [facility], reported called to Daniel-LPN at the facility,waiting on [facility] to transport her back to facility.

## 2021-04-07 NOTE — Discharge Summary (Signed)
Physician Discharge Summary  Becky Hendricks RUE:[id number] DOB: [birth year] DOA: 03/29/2021  PCP: Roderic Scarce, MD  Admit date: 03/29/2021 Discharge date: 04/07/2021  Admitted From: SNF Disposition: SNF  Recommendations for Outpatient Follow-up:  Follow up with SNF provider at earliest convenience with repeat labs in the next few days Outpatient follow-up with psychiatrist at earliest convenience Recommend outpatient evaluation and follow-up by palliative care for goals of care discussion  Outpatient follow-up with pulmonary follow up in ED if symptoms worsen or new appear   Home Health: No Equipment/Devices: None  Discharge Condition: Guarded CODE STATUS: Full Diet recommendation: Heart healthy/carb modified  Brief/Interim Summary: 75 year old female with past medical history of hypertension, diabetes mellitus type 2, paroxysmal A. fib, obesity, chronic diastolic heart failure, seizure disorder, bipolar disorder presented on 03/29/2021 for acute loss of consciousness and respiratory stress from rehab facility.  She was found to be extremely hypoxic and acidotic and required emergent intubation.  There was a concern for pneumonia and UTI on admission as a source of sepsis.  CT of the head was negative for acute intracranial process but showed possible sinusitis.  CT of the abdomen/pelvis showed large amount of stool in distal sigmoid colon.  She was subsequently extubated and reintubated and then extubated again.  During the hospitalization, she had slow improvement.  She had encephalopathy for which CT of the brain and MRI of the brain were negative for acute abnormalities.  Psychiatry was consulted and her medications were adjusted.  Palliative care consulted for goals of care discussion; currently remains full code.  She has completed a course of antibiotic treatment.  She is currently hemodynamically stable and psychiatry has cleared the patient for discharge.  She will be discharged to SNF  today once bed is available.  Discharge Diagnoses:   Acute hypoxic hypercapnic respiratory failure Pneumonia: Present on admission -Patient was intubated on admission; subsequently extubated and reintubated and extubated again -Currently requiring BiPAP at night and 2 to 3 L oxygen via nasal cannula during daytime. -PCCM signed off on 04/04/2021 recommending nightly BiPAP -She completed a 7-day course of antibiotic therapy on 04/05/2021 -2D echo showed EF of 60 to 65% with no wall motion abnormality; moderately elevated pulmonary artery systolic pressure -PCCM recommended to taper steroids because of worsening agitation.  Continue prednisone 20 mg daily for 5 more days.   Probable OSA/OHS -PCCM recommends nightly BiPAP and outpatient sleep study   Septic shock: Present on admission Proteus mirabilis UTI: Present on admission -Currently on Ancef.  Off pressors. -Sepsis has resolved.  Currently hemodynamically stable. -Completed 7-day course of antibiotics on 04/05/2021   Leukocytosis -Possibly from above.  Improving.  Outpatient follow-up.  Acute metabolic and toxic encephalopathy Seizure disorder Bipolar disorder -Possibly from a combination of hypercapnia and steroids -CT and MRI of the brain were negative for acute abnormalities. -Limited EEG suggestive of moderate to severe diffuse encephalopathy, nonspecific, no seizures or definite epileptiform discharges were seen throughout the recording. -Ammonia level within normal limits.  -Mental status improving with BiPAP use and resumption of Risperdal. -Psychiatry following: Continue Depakote and risperidone.  Psychiatry has cleared the patient for discharge on current doses of Depakote and risperidone.  Outpatient follow-up with psychiatry.  Hypertension -Continue Coreg.  Resume amlodipine.  Losartan to remain on hold for now.  Hyperlipidemia -Continue statin  Paroxysmal A. Fib -Currently rate controlled.  Continue Coreg and  Eliquis.  Outpatient follow-up with cardiology   Diabetes mellitus type 2 -A1c 7.  Continue carb modified diet and prior  regimen.  Outpatient follow-up.    Generalized conditioning -Patient will need to go back to SNF. -Overall prognosis is guarded to poor.  Patient has had palliative care discussion during this hospitalization.  Remains full code.  Outpatient follow-up with palliative care.   Constipation -Continue bowel regimen   Discharge Instructions  Discharge Instructions     Amb Referral to Palliative Care   Complete by: As directed    Diet - low sodium heart healthy   Complete by: As directed    Diet Carb Modified   Complete by: As directed    Discharge wound care:   Complete by: As directed    Foam dressing to sacrum, change Q 3 days or PRN soiling   Increase activity slowly   Complete by: As directed       Allergies as of 04/07/2021       Reactions   Chlorhexidine Gluconate Itching   Vancomycin    Vancomycin infusion related reaction- May need Vanc to be given at a slower rate    Acetazolamide Er Rash        Medication List     STOP taking these medications    losartan 25 MG tablet Commonly known as: COZAAR       TAKE these medications    amLODipine 10 MG tablet Commonly known as: NORVASC Take 1 tablet (10 mg total) by mouth daily.   apixaban 5 MG Tabs tablet Commonly known as: ELIQUIS Take 5 mg by mouth 2 (two) times daily.   atorvastatin 10 MG tablet Commonly known as: LIPITOR Take 10 mg by mouth daily.   calcium carbonate 500 MG chewable tablet Commonly known as: TUMS - dosed in mg elemental calcium Chew 500 mg by mouth daily.   carvedilol 6.25 MG tablet Commonly known as: COREG Take 1 tablet (6.25 mg total) by mouth 2 (two) times daily with a meal.   Combivent Respimat 20-100 MCG/ACT Aers respimat Generic drug: Ipratropium-Albuterol Inhale 1 puff into the lungs every 12 (twelve) hours.   diphenhydrAMINE 2 % cream Commonly  known as: BENADRYL Apply 1 application topically 3 (three) times daily as needed for itching.   divalproex 500 MG DR tablet Commonly known as: DEPAKOTE Take 500 mg by mouth 2 (two) times daily.   docusate sodium 100 MG capsule Commonly known as: COLACE Take 1 capsule (100 mg total) by mouth 2 (two) times daily as needed for mild constipation.   ferrous sulfate 324 MG Tbec Take 324 mg by mouth daily.   insulin aspart 100 UNIT/ML injection Commonly known as: novoLOG CBG 70 - 120: 0 units CBG 121 - 150: 2 units CBG 151 - 200: 3 units CBG 201 - 250: 5 units CBG 251 - 300: 8 units CBG 301 - 350: 11 units CBG 351 - 400: 15 units   Januvia 50 MG tablet Generic drug: sitaGLIPtin Take 50 mg by mouth daily.   multivitamin with minerals Tabs tablet Take 1 tablet by mouth daily.   pantoprazole 40 MG tablet Commonly known as: PROTONIX Take 1 tablet (40 mg total) by mouth daily.   polyethylene glycol 17 g packet Commonly known as: MIRALAX / GLYCOLAX Take 17 g by mouth daily.   predniSONE 20 MG tablet Commonly known as: DELTASONE Take 1 tablet (20 mg total) by mouth daily with breakfast.   risperiDONE 2 MG tablet Commonly known as: RISPERDAL Take 0.5 tablets (1 mg total) by mouth 2 (two) times daily. What changed: how much to take  sennosides-docusate sodium 8.6-50 MG tablet Commonly known as: SENOKOT-S Take 2 tablets by mouth daily.   tamsulosin 0.4 MG Caps capsule Commonly known as: FLOMAX Take 1 capsule (0.4 mg total) by mouth daily. What changed: when to take this   Vitamin D 125 MCG (5000 UT) Caps Take 10,000 Units by mouth once a week. Wednesday               Discharge Care Instructions  (From admission, onward)           Start     Ordered   04/07/21 0000  Discharge wound care:       Comments: Foam dressing to sacrum, change Q 3 days or PRN soiling   04/07/21 1139            Contact information for follow-up providers     Psychiatrist  Follow up.   Why: At earliest convenience             Contact information for after-discharge care     Destination     [name] .   Service: Skilled Nursing Contact information: [address] [place] [address] [phone]                    Allergies  Allergen Reactions   Chlorhexidine Gluconate Itching   Vancomycin     Vancomycin infusion related reaction- May need Vanc to be given at a slower rate    Acetazolamide Er Rash    Consultations: PCCM/psychiatry/palliative care   Procedures/Studies: DG Chest 2 View  Result Date: 03/29/2021 CLINICAL DATA:  Fatigue and dyspnea EXAM: CHEST - 2 VIEW COMPARISON:  11/27/2020 FINDINGS: Cardiac shadow is mildly prominent but stable. Previously seen right jugular catheter is been removed in the interval. The overall inspiratory effort is poor. Mild central vascular congestion is noted without definitive edema. No sizable effusion is seen. IMPRESSION: Poor inspiratory effort. Mild vascular congestion without edema. Electronically Signed   By: Alcide Clever M.D.   On: 03/29/2021 17:28   DG Abd 1 View  Result Date: 03/29/2021 CLINICAL DATA:  OG tube positioning. EXAM: ABDOMEN - 1 VIEW COMPARISON:  None. FINDINGS: An orogastric tube is seen with its distal tip overlying the expected region of the body of the stomach (overlying the superior endplate of the L2 vertebral body). The bowel gas pattern is normal. Radiopaque contrast is seen within the bilateral renal collecting systems. IMPRESSION: Orogastric tube positioning, as described above. Electronically Signed   By: Aram Candela M.D.   On: 03/29/2021 22:23   CT HEAD WO CONTRAST ( )  Result Date: 04/02/2021 CLINICAL DATA:  TIA. Increased altered mental status and asymmetric pupils. EXAM: CT HEAD WITHOUT CONTRAST TECHNIQUE: Contiguous axial images were obtained from the base of the skull through the vertex without intravenous contrast.  COMPARISON:  03/29/2021 FINDINGS: Brain: No evidence of acute infarction, hemorrhage, hydrocephalus, extra-axial collection or mass lesion/mass effect. Generalized atrophy. Vascular: No hyperdense vessel or unexpected calcification. Skull: Normal. Negative for fracture or focal lesion. Sinuses/Orbits: Patchy opacification of paranasal sinuses, overall mild. IMPRESSION: No acute or interval finding. Electronically Signed   By: Marnee Spring M.D.   On: 04/02/2021 09:53   CT HEAD WO CONTRAST ( )  Result Date: 03/29/2021 CLINICAL DATA:  Delirium, fatigue, altered mental status EXAM: CT HEAD WITHOUT CONTRAST TECHNIQUE: Contiguous axial images were obtained from the base of the skull through the vertex without intravenous contrast. COMPARISON:  11/21/2020 FINDINGS: Brain: No evidence of acute  infarction, hemorrhage, hydrocephalus, extra-axial collection or mass lesion/mass effect. Periventricular white matter changes, likely the sequela of chronic small vessel ischemic disease. Vascular: No hyperdense vessel or unexpected calcification. Skull: Normal. Negative for fracture or focal lesion. Sinuses/Orbits: Air-fluid level in the right sphenoid sinus. Mild mucosal thickening in the ethmoid air cells. The orbits are unremarkable. Other: The mastoids are clear. IMPRESSION: 1. No acute intracranial process. 2. Air-fluid level in the right sphenoid sinus, which can be seen in the setting of acute sinusitis. Electronically Signed   By: Wiliam Ke M.D.   On: 03/29/2021 16:56   MR BRAIN WO CONTRAST  Result Date: 04/02/2021 CLINICAL DATA:  Mental status change, unknown cause EXAM: MRI HEAD WITHOUT CONTRAST TECHNIQUE: Multiplanar, multiecho pulse sequences of the brain and surrounding structures were obtained without intravenous contrast. COMPARISON:  11/19/2020 FINDINGS: Brain: There is no acute infarction or intracranial hemorrhage. There is no intracranial mass, mass effect, or edema. There is no hydrocephalus or  extra-axial fluid collection. Prominence of the ventricles and sulci reflects generalized parenchymal volume loss. Patchy T2 hyperintensity in the supratentorial white matter is nonspecific but may reflect minor chronic microvascular ischemic changes. Signal abnormality on the prior study has resolved. Vascular: Major vessel flow voids at the skull base are preserved. Skull and upper cervical spine: Normal marrow signal is preserved. Sinuses/Orbits: Mild mucosal thickening.  Orbits are unremarkable. Other: Sella is unremarkable. Mild patchy mastoid fluid opacification. IMPRESSION: No acute infarction, hemorrhage, or mass. Electronically Signed   By: Guadlupe Spanish M.D.   On: 04/02/2021 18:36   CT ABDOMEN PELVIS W CONTRAST  Result Date: 03/29/2021 CLINICAL DATA:  Acute generalized abdominal pain. EXAM: CT ABDOMEN AND PELVIS WITH CONTRAST TECHNIQUE: Multidetector CT imaging of the abdomen and pelvis was performed using the standard protocol following bolus administration of intravenous contrast. CONTRAST:  52mL OMNIPAQUE IOHEXOL 350 MG/ML SOLN COMPARISON:  November 17, 2020. FINDINGS: Lower chest: Mild bilateral posterior basilar subsegmental atelectasis or infiltrates are noted with small bilateral pleural effusions. Hepatobiliary: Cholelithiasis is noted. No biliary dilatation is noted. The liver is unremarkable. Pancreas: Unremarkable. No pancreatic ductal dilatation or surrounding inflammatory changes. Spleen: Normal in size without focal abnormality. Adrenals/Urinary Tract: Adrenal glands appear normal. Small nonobstructive left renal calculus is noted. Right renal cyst is noted. No hydronephrosis or renal obstruction is noted. Urinary bladder is unremarkable. Stomach/Bowel: Stomach appears normal. There is no definite evidence of bowel obstruction. The appendix is not clearly visualized. Large amount of stool seen in the distal sigmoid colon and rectum with wall thickening suggesting some degree of  proctocolitis. Vascular/Lymphatic: Aortic atherosclerosis. No enlarged abdominal or pelvic lymph nodes. Reproductive: Stable uterine fibroids. No adnexal abnormality is noted. Other: Small fat containing left inguinal hernia is noted. No ascites is noted. Musculoskeletal: No acute or significant osseous findings. IMPRESSION: Mild bilateral posterior basilar subsegmental atelectasis or infiltrates are noted with small bilateral pleural effusions. Cholelithiasis. Large amount of stool seen in distal sigmoid colon and rectum with wall thickening suggesting some degree of proctocolitis. Small nonobstructive left renal calculus. No hydronephrosis or renal obstruction is noted. Stable uterine fibroids. Small fat containing left inguinal hernia. Aortic Atherosclerosis (ICD10-I70.0). Electronically Signed   By: Lupita Raider M.D.   On: 03/29/2021 18:40   DG CHEST PORT 1 VIEW  Result Date: 04/01/2021 CLINICAL DATA:  Acute respiratory failure EXAM: PORTABLE CHEST 1 VIEW COMPARISON:  Two days ago FINDINGS: Extubation of the trachea and esophagus. Very low volume chest with dense opacity on the right more  than left. Cardiopericardial enlargement. No visible pneumothorax. IMPRESSION: Low volume chest with dense opacity at the bases that correlates with atelectasis on recent abdominal CT. No worsening after extubation. Electronically Signed   By: Marnee Spring M.D.   On: 04/01/2021 07:32   DG CHEST PORT 1 VIEW  Result Date: 03/30/2021 CLINICAL DATA:  Status post re-intubation EXAM: PORTABLE CHEST 1 VIEW COMPARISON:  Film from earlier in the same day. FINDINGS: Endotracheal tube is noted 2 cm above the carina. Gastric catheter extends into the stomach. Cardiac shadow is enlarged but stable. Bilateral pleural effusions are again noted. Parenchymal opacities are seen as well. IMPRESSION: Endotracheal tube in satisfactory position following re-intubation. Remainder of the study is stable. Electronically Signed   By: Alcide Clever M.D.   On: 03/30/2021 23:54   DG Chest Port 1 View  Result Date: 03/30/2021 CLINICAL DATA:  Acute respiratory failure EXAM: PORTABLE CHEST 1 VIEW COMPARISON:  03/29/2021 FINDINGS: ET tube terminates 2.5 cm above the carina. Enteric tube courses below the diaphragm with distal tip beyond the inferior margin of the film. Stable cardiomediastinal contours. Low lung volumes. Persistent bibasilar opacities with probable small bilateral pleural effusions. No pneumothorax. IMPRESSION: Persistent bibasilar opacities with probable small bilateral pleural effusions. Electronically Signed   By: Duanne Guess D.O.   On: 03/30/2021 08:07   DG Chest Port 1 View  Result Date: 03/29/2021 CLINICAL DATA:  Endotracheal tube positioning. EXAM: PORTABLE CHEST 1 VIEW COMPARISON:  March 29, 2021 (5:07 p.m.) FINDINGS: An endotracheal tube is seen with its distal tip approximately 2.5 cm from the carina. A nasogastric tube is noted with its distal end extending below the level of the diaphragm. Mild to moderate severity areas of atelectasis and/or infiltrate are seen within the bilateral lung bases. There is no evidence of a pleural effusion or pneumothorax. There is mild to moderate severity enlargement of the cardiac silhouette. Degenerative changes seen throughout the thoracic spine. IMPRESSION: Endotracheal tube positioning, as described above. Electronically Signed   By: Aram Candela M.D.   On: 03/29/2021 22:25   ECHOCARDIOGRAM COMPLETE  Result Date: 04/03/2021    ECHOCARDIOGRAM REPORT   Patient Name:   ARTHURINE OLEARY Date of Exam: 04/03/2021 Medical Rec #:  [id number]      Height:       66.0 in Accession #:    [id number]     Weight:       236.1 lb Date of Birth:  [birth year]       BSA:          2.146 m Patient Age:    75 years       BP:           156/63 mmHg Patient Gender: F              HR:           70 bpm. Exam Location:  Inpatient Procedure: 2D Echo, Cardiac Doppler and Color Doppler Indications:    CHF   History:        Patient has prior history of Echocardiogram examinations, most                 recent 11/19/2020. Arrythmias:Atrial Fibrillation and Cardiac                 Arrest; Risk Factors:Diabetes and Dyslipidemia.  Sonographer:    Neomia Dear RDCS Referring Phys: [id number] MURALI RAMASWAMY  Sonographer Comments: Technically challenging study due to limited acoustic windows and patient is  morbidly obese. Image acquisition challenging due to patient body habitus. IMPRESSIONS  1. Technically difficult study. Left ventricular ejection fraction, by estimation, is 60 to 65%. The left ventricle has normal function. The left ventricle has no regional wall motion abnormalities. There is moderate asymmetric left ventricular hypertrophy of the basal-septal segment.     Left ventricular diastolic parameters are consistent with Grade II diastolic dysfunction (pseudonormalization). Elevated left atrial pressure.  2. Right ventricule is poorly visualized but grossly normal size and systolic function. There is moderately elevated pulmonary artery systolic pressure. The estimated right ventricular systolic pressure is 47.7 mmHg.  3. The mitral valve is normal in structure. No evidence of mitral valve regurgitation.  4. The aortic valve is tricuspid. Aortic valve regurgitation is not visualized. No aortic stenosis is present. FINDINGS  Left Ventricle: Left ventricular ejection fraction, by estimation, is 60 to 65%. The left ventricle has normal function. The left ventricle has no regional wall motion abnormalities. The left ventricular internal cavity size was normal in size. There is  moderate asymmetric left ventricular hypertrophy of the basal-septal segment. Left ventricular diastolic parameters are consistent with Grade II diastolic dysfunction (pseudonormalization). Elevated left atrial pressure. Right Ventricle: The right ventricular size is normal. Right vetricular wall thickness was not well visualized. Right ventricular  systolic function is normal. There is moderately elevated pulmonary artery systolic pressure. The tricuspid regurgitant velocity is 3.15 m/s, and with an assumed right atrial pressure of 8 mmHg, the estimated right ventricular systolic pressure is 47.7 mmHg. Left Atrium: Left atrial size was normal in size. Right Atrium: Right atrial size was normal in size. Pericardium: There is no evidence of pericardial effusion. Presence of pericardial fat pad. Mitral Valve: The mitral valve is normal in structure. No evidence of mitral valve regurgitation. Tricuspid Valve: The tricuspid valve is normal in structure. Tricuspid valve regurgitation is trivial. Aortic Valve: The aortic valve is tricuspid. Aortic valve regurgitation is not visualized. No aortic stenosis is present. Aortic valve mean gradient measures 5.0 mmHg. Aortic valve peak gradient measures 10.0 mmHg. Aortic valve area, by VTI measures 1.79  cm. Pulmonic Valve: The pulmonic valve was not well visualized. Pulmonic valve regurgitation is trivial. Aorta: The aortic root and ascending aorta are structurally normal, with no evidence of dilitation. IAS/Shunts: The interatrial septum was not well visualized.  LEFT VENTRICLE PLAX 2D LVIDd:         4.50 cm     Diastology LVIDs:         3.30 cm     LV e' medial:    4.90 cm/s LV PW:         1.60 cm     LV E/e' medial:  20.8 LV IVS:        1.50 cm     LV e' lateral:   7.62 cm/s LVOT diam:     1.80 cm     LV E/e' lateral: 13.4 LV SV:         67 LV SV Index:   31 LVOT Area:     2.54 cm  LV Volumes (MOD) LV vol d, MOD A4C: 64.0 ml LV vol s, MOD A4C: 21.2 ml LV SV MOD A4C:     64.0 ml RIGHT VENTRICLE RV Basal diam:  3.80 cm RV Mid diam:    2.00 cm RV S prime:     16.00 cm/s TAPSE (M-mode): 2.6 cm LEFT ATRIUM           Index  RIGHT ATRIUM           Index LA diam:      3.80 cm 1.77 cm/m  RA Area:     14.80 cm LA Vol (A4C): 63.9 ml 29.78 ml/m RA Volume:   34.90 ml  16.26 ml/m  AORTIC VALVE                    PULMONIC  VALVE AV Area (Vmax):    1.72 cm     PV Vmax:       0.98 m/s AV Area (Vmean):   1.68 cm     PV Vmean:      66.000 cm/s AV Area (VTI):     1.79 cm     PV VTI:        0.230 m AV Vmax:           158.00 cm/s  PV Peak grad:  3.9 mmHg AV Vmean:          106.000 cm/s PV Mean grad:  2.0 mmHg AV VTI:            0.376 m AV Peak Grad:      10.0 mmHg AV Mean Grad:      5.0 mmHg LVOT Vmax:         107.00 cm/s LVOT Vmean:        70.000 cm/s LVOT VTI:          0.264 m LVOT/AV VTI ratio: 0.70  AORTA Ao Root diam: 2.70 cm Ao Asc diam:  3.60 cm MITRAL VALVE                TRICUSPID VALVE MV Area (PHT): 4.57 cm     TR Peak grad:   39.7 mmHg MV Decel Time: 166 msec     TR Vmax:        315.00 cm/s MV E velocity: 102.00 cm/s MV A velocity: 123.00 cm/s  SHUNTS MV E/A ratio:  0.83         Systemic VTI:  0.26 m                             Systemic Diam: 1.80 cm Epifanio Lesches MD Electronically signed by Epifanio Lesches MD Signature Date/Time: 04/03/2021/5:53:41 PM    Final       Subjective: Patient seen and examined at bedside.  Poor historian.  No overnight fever, seizures reported.  Discharge Exam: Vitals:   04/07/21 0526 04/07/21 1112  BP: (!) 149/75 133/61  Pulse: (!) 59 70  Resp: 20 20  Temp: 98.2 F (36.8 C) 99.1 F (37.3 C)  SpO2: 98% 96%    General: Pt is sleepy, wakes up slightly, slow to respond.  Looks chronically ill and deconditioned.  Currently on 3 L oxygen via nasal cannula.   Cardiovascular: Intermittently bradycardic, S1/S2 + Respiratory: bilateral decreased breath sounds at bases Abdominal: Soft, obese, NT, ND, bowel sounds + Extremities: Bilateral lower extremity edema present; no cyanosis    The results of significant diagnostics from this hospitalization (including imaging, microbiology, ancillary and laboratory) are listed below for reference.     Microbiology: Recent Results (from the past 240 hour(s))  Resp Panel by RT-PCR (Flu A&B, Covid) Nasopharyngeal Swab     Status:  None   Collection Time: 03/29/21  4:12 PM   Specimen: Nasopharyngeal Swab; Nasopharyngeal(NP) swabs in vial transport medium  Result Value Ref Range Status   SARS Coronavirus  2 by RT PCR NEGATIVE NEGATIVE Final    Comment: (NOTE) SARS-CoV-2 target nucleic acids are NOT DETECTED.  The SARS-CoV-2 RNA is generally detectable in upper respiratory specimens during the acute phase of infection. The lowest concentration of SARS-CoV-2 viral copies this assay can detect is 138 copies/mL. A negative result does not preclude SARS-Cov-2 infection and should not be used as the sole basis for treatment or other patient management decisions. A negative result may occur with  improper specimen collection/handling, submission of specimen other than nasopharyngeal swab, presence of viral mutation(s) within the areas targeted by this assay, and inadequate number of viral copies(<138 copies/mL). A negative result must be combined with clinical observations, patient history, and epidemiological information. The expected result is Negative.  Fact Sheet for Patients:  BloggerCourse.comhttps://www.fda.gov/media/152166/download  Fact Sheet for Healthcare Providers:  SeriousBroker.ithttps://www.fda.gov/media/152162/download  This test is no t yet approved or cleared by the [place] FDA and  has been authorized for detection and/or diagnosis of SARS-CoV-2 by FDA under an Emergency Use Authorization (EUA). This EUA will remain  in effect (meaning this test can be used) for the duration of the COVID-19 declaration under Section 564(b)(1) of the Act, 21 U.S.C.section 360bbb-3(b)(1), unless the authorization is terminated  or revoked sooner.       Influenza A by PCR NEGATIVE NEGATIVE Final   Influenza B by PCR NEGATIVE NEGATIVE Final    Comment: (NOTE) The Xpert Xpress SARS-CoV-2/FLU/RSV plus assay is intended as an aid in the diagnosis of influenza from Nasopharyngeal swab specimens and should not be used as a sole basis for  treatment. Nasal washings and aspirates are unacceptable for Xpert Xpress SARS-CoV-2/FLU/RSV testing.  Fact Sheet for Patients: BloggerCourse.comhttps://www.fda.gov/media/152166/download  Fact Sheet for Healthcare Providers: SeriousBroker.ithttps://www.fda.gov/media/152162/download  This test is not yet approved or cleared by the [place] FDA and has been authorized for detection and/or diagnosis of SARS-CoV-2 by FDA under an Emergency Use Authorization (EUA). This EUA will remain in effect (meaning this test can be used) for the duration of the COVID-19 declaration under Section 564(b)(1) of the Act, 21 U.S.C. section 360bbb-3(b)(1), unless the authorization is terminated or revoked.  Performed at [facility], [address] [place], [place] [address]   Culture, blood (single)     Status: None   Collection Time: 03/29/21  4:46 PM   Specimen: BLOOD  Result Value Ref Range Status   Specimen Description   Final    BLOOD BLOOD RIGHT FOREARM Performed at [facility], [address] [place], [place] [address]    Special Requests   Final    BOTTLES DRAWN AEROBIC AND ANAEROBIC Blood Culture adequate volume Performed at [facility], [address] [place], [place] [address]    Culture   Final    NO GROWTH 5 DAYS Performed at [facility], [address] [place], [place] [address]    Report Status 04/03/2021 FINAL  Final  Urine Culture     Status: Abnormal   Collection Time: 03/29/21  8:46 PM   Specimen: Urine, Clean Catch  Result Value Ref Range Status   Specimen Description   Final    URINE, CLEAN CATCH Performed at [facility], [address] [place], [place] [address]    Special Requests   Final    NONE Performed at [facility], [address] [place], [place] [address]    Culture >=100,000 COLONIES/mL PROTEUS MIRABILIS (A)  Final   Report Status  04/01/2021 FINAL  Final   Organism ID, Bacteria  PROTEUS MIRABILIS (A)  Final      Susceptibility   Proteus mirabilis - MIC*    AMPICILLIN <=2 SENSITIVE Sensitive     CEFAZOLIN <=4 SENSITIVE Sensitive     CEFEPIME <=0.12 SENSITIVE Sensitive     CEFTRIAXONE <=0.25 SENSITIVE Sensitive     CIPROFLOXACIN <=0.25 SENSITIVE Sensitive     GENTAMICIN <=1 SENSITIVE Sensitive     IMIPENEM 1 SENSITIVE Sensitive     NITROFURANTOIN 128 RESISTANT Resistant     TRIMETH/SULFA <=20 SENSITIVE Sensitive     AMPICILLIN/SULBACTAM <=2 SENSITIVE Sensitive     PIP/TAZO <=4 SENSITIVE Sensitive     * >=100,000 COLONIES/mL PROTEUS MIRABILIS  Culture, blood (single)     Status: None   Collection Time: 03/29/21  9:30 PM   Specimen: BLOOD  Result Value Ref Range Status   Specimen Description   Final    BLOOD RIGHT ANTECUBITAL Performed at [facility], [address], [place], [place] [address]    Special Requests   Final    BOTTLES DRAWN AEROBIC AND ANAEROBIC Blood Culture adequate volume Performed at [facility], [address] [place], [place] [address]    Culture   Final    NO GROWTH 5 DAYS Performed at [facility], [address], [place], [place] [address]    Report Status 04/03/2021 FINAL  Final  MRSA Next Gen by PCR, Nasal     Status: Abnormal   Collection Time: 03/30/21 12:00 AM   Specimen: Nasal Mucosa; Nasal Swab  Result Value Ref Range Status   MRSA by PCR Next Gen DETECTED (A) NOT DETECTED Final    Comment: RESULT CALLED TO, READ BACK BY AND VERIFIED WITH: KAREN, RN @ 0403 ON 03/30/21 C VARNER (NOTE) The GeneXpert MRSA Assay (FDA approved for NASAL specimens only), is one component of a comprehensive MRSA colonization surveillance program. It is not intended to diagnose MRSA infection nor to guide or monitor treatment for MRSA infections. Test performance is not FDA approved in patients less than 69 years old. Performed at [facility], [address] [place], [place]  [address]   Culture, blood (x 2)     Status: None   Collection Time: 03/30/21  1:53 AM   Specimen: BLOOD  Result Value Ref Range Status   Specimen Description   Final    BLOOD BLOOD LEFT HAND Performed at [facility], [address] [place], [place] [address]    Special Requests   Final    BOTTLES DRAWN AEROBIC ONLY Blood Culture adequate volume Performed at [facility], [address], [place], [place] [address]    Culture   Final    NO GROWTH 5 DAYS Performed at [facility], [address] [place], [place] [address]    Report Status 04/04/2021 FINAL  Final  Culture, blood (x 2)     Status: None   Collection Time: 03/30/21  3:11 AM   Specimen: BLOOD  Result Value Ref Range Status   Specimen Description   Final    BLOOD BLOOD LEFT HAND Performed at [facility], [address] [place], [place] [address]    Special Requests   Final    BOTTLES DRAWN AEROBIC ONLY Blood Culture adequate volume Performed at [facility], [address], [place], [place] [address]    Culture   Final  NO GROWTH 5 DAYS Performed at [facility], [address], [place], [place] [address]    Report Status 04/04/2021 FINAL  Final     Labs: BNP (last 3 results) Recent Labs    11/30/20 0500 03/29/21 1614 04/03/21 1333  BNP 48.4 118.6* 234.6*   Basic Metabolic Panel: Recent Labs  Lab 04/01/21 0242 04/02/21 0304 04/03/21 0311 04/03/21 1333 04/04/21 0553 04/05/21 0323 04/06/21 0428 04/07/21 0452  NA 138   < > 134*  --  135 135 141 139  K 3.8   < > 4.4  --  4.0 3.6 3.6 3.9  CL 101   < > 96*  --  94* 95* 94* 93*  CO2 29   < > 31  --  34* 35* 38* 41*  GLUCOSE 115*   < > 291*  --  193* 179* 226* 151*  BUN 14   < > 14  --  23 26* 25* 23  CREATININE 0.47   < > 0.52  --  0.71 0.66 0.67 0.56  CALCIUM 8.2*   < > 8.5*  --  8.9 8.5* 8.4* 8.7*  MG 1.9  --   --  2.2 2.0  --  1.9 1.8  PHOS  --   --   --   2.4* 3.1 2.4* 3.1  --    < > = values in this interval not displayed.   Liver Function Tests: Recent Labs  Lab 04/05/21 0323  AST 9*  ALT 8  ALKPHOS 27*  BILITOT 0.4  PROT 5.0*  ALBUMIN 2.3*   No results for input(s): LIPASE, AMYLASE in the last 168 hours. Recent Labs  Lab 04/02/21 1014  AMMONIA 28   CBC: Recent Labs  Lab 04/03/21 0311 04/04/21 0553 04/05/21 0323 04/06/21 0428 04/07/21 0452  WBC 6.8 10.0 20.2* 15.5* 14.7*  NEUTROABS 5.4 6.2  --  11.4* 8.7*  HGB 10.5* 10.6* 10.3* 10.1* 10.5*  HCT 34.8* 35.5* 33.8* 33.5* 35.2*  MCV 92.8 93.2 93.1 93.8 93.9  PLT 203 210 209 196 196   Cardiac Enzymes: No results for input(s): CKTOTAL, CKMB, CKMBINDEX, TROPONINI in the last 168 hours. BNP: Invalid input(s): POCBNP CBG: Recent Labs  Lab 04/06/21 0735 04/06/21 1149 04/06/21 1614 04/07/21 0657 04/07/21 1110  GLUCAP 142* 188* 244* 139* 213*   D-Dimer No results for input(s): DDIMER in the last 72 hours. Hgb A1c No results for input(s): HGBA1C in the last 72 hours. Lipid Profile No results for input(s): CHOL, HDL, LDLCALC, TRIG, CHOLHDL, LDLDIRECT in the last 72 hours. Thyroid function studies No results for input(s): TSH, T4TOTAL, T3FREE, THYROIDAB in the last 72 hours.  Invalid input(s): FREET3 Anemia work up No results for input(s): VITAMINB12, FOLATE, FERRITIN, TIBC, IRON, RETICCTPCT in the last 72 hours. Urinalysis    Component Value Date/Time   COLORURINE YELLOW 03/29/2021 2000   APPEARANCEUR CLOUDY (A) 03/29/2021 2000   LABSPEC 1.034 (H) 03/29/2021 2000   PHURINE 6.0 03/29/2021 2000   GLUCOSEU NEGATIVE 03/29/2021 2000   HGBUR NEGATIVE 03/29/2021 2000   BILIRUBINUR NEGATIVE 03/29/2021 2000   KETONESUR NEGATIVE 03/29/2021 2000   PROTEINUR NEGATIVE 03/29/2021 2000   NITRITE POSITIVE (A) 03/29/2021 2000   LEUKOCYTESUR LARGE (A) 03/29/2021 2000   Sepsis Labs Invalid input(s): PROCALCITONIN,  WBC,  LACTICIDVEN Microbiology Recent Results (from the  past 240 hour(s))  Resp Panel by RT-PCR (Flu A&B, Covid) Nasopharyngeal Swab     Status: None   Collection Time: 03/29/21  4:12 PM  Specimen: Nasopharyngeal Swab; Nasopharyngeal(NP) swabs in vial transport medium  Result Value Ref Range Status   SARS Coronavirus 2 by RT PCR NEGATIVE NEGATIVE Final    Comment: (NOTE) SARS-CoV-2 target nucleic acids are NOT DETECTED.  The SARS-CoV-2 RNA is generally detectable in upper respiratory specimens during the acute phase of infection. The lowest concentration of SARS-CoV-2 viral copies this assay can detect is 138 copies/mL. A negative result does not preclude SARS-Cov-2 infection and should not be used as the sole basis for treatment or other patient management decisions. A negative result may occur with  improper specimen collection/handling, submission of specimen other than nasopharyngeal swab, presence of viral mutation(s) within the areas targeted by this assay, and inadequate number of viral copies(<138 copies/mL). A negative result must be combined with clinical observations, patient history, and epidemiological information. The expected result is Negative.  Fact Sheet for Patients:  BloggerCourse.com  Fact Sheet for Healthcare Providers:  SeriousBroker.it  This test is no t yet approved or cleared by the [place] FDA and  has been authorized for detection and/or diagnosis of SARS-CoV-2 by FDA under an Emergency Use Authorization (EUA). This EUA will remain  in effect (meaning this test can be used) for the duration of the COVID-19 declaration under Section 564(b)(1) of the Act, 21 U.S.C.section 360bbb-3(b)(1), unless the authorization is terminated  or revoked sooner.       Influenza A by PCR NEGATIVE NEGATIVE Final   Influenza B by PCR NEGATIVE NEGATIVE Final    Comment: (NOTE) The Xpert Xpress SARS-CoV-2/FLU/RSV plus assay is intended as an aid in the diagnosis of  influenza from Nasopharyngeal swab specimens and should not be used as a sole basis for treatment. Nasal washings and aspirates are unacceptable for Xpert Xpress SARS-CoV-2/FLU/RSV testing.  Fact Sheet for Patients: BloggerCourse.com  Fact Sheet for Healthcare Providers: SeriousBroker.it  This test is not yet approved or cleared by the [place] FDA and has been authorized for detection and/or diagnosis of SARS-CoV-2 by FDA under an Emergency Use Authorization (EUA). This EUA will remain in effect (meaning this test can be used) for the duration of the COVID-19 declaration under Section 564(b)(1) of the Act, 21 U.S.C. section 360bbb-3(b)(1), unless the authorization is terminated or revoked.  Performed at [facility], [address] [place], [place] [address]   Culture, blood (single)     Status: None   Collection Time: 03/29/21  4:46 PM   Specimen: BLOOD  Result Value Ref Range Status   Specimen Description   Final    BLOOD BLOOD RIGHT FOREARM Performed at [facility], [address], [place], [place] [address]    Special Requests   Final    BOTTLES DRAWN AEROBIC AND ANAEROBIC Blood Culture adequate volume Performed at [facility], [address], [place], [place] [address]    Culture   Final    NO GROWTH 5 DAYS Performed at [facility], [address], [place], [place] [address]    Report Status 04/03/2021 FINAL  Final  Urine Culture     Status: Abnormal   Collection Time: 03/29/21  8:46 PM   Specimen: Urine, Clean Catch  Result Value Ref Range Status   Specimen Description   Final    URINE, CLEAN CATCH Performed at [facility], [address] [place], [place] [address]    Special Requests   Final    NONE Performed at [facility], [address] [place], [place] [address]    Culture >=100,000 COLONIES/mL PROTEUS  MIRABILIS (A)  Final   Report Status 04/01/2021 FINAL  Final   Organism ID, Bacteria PROTEUS MIRABILIS (A)  Final      Susceptibility   Proteus mirabilis - MIC*    AMPICILLIN <=2 SENSITIVE Sensitive     CEFAZOLIN <=4 SENSITIVE Sensitive     CEFEPIME <=0.12 SENSITIVE Sensitive     CEFTRIAXONE <=0.25 SENSITIVE Sensitive     CIPROFLOXACIN <=0.25 SENSITIVE Sensitive     GENTAMICIN <=1 SENSITIVE Sensitive     IMIPENEM 1 SENSITIVE Sensitive     NITROFURANTOIN 128 RESISTANT Resistant     TRIMETH/SULFA <=20 SENSITIVE Sensitive     AMPICILLIN/SULBACTAM <=2 SENSITIVE Sensitive     PIP/TAZO <=4 SENSITIVE Sensitive     * >=100,000 COLONIES/mL PROTEUS MIRABILIS  Culture, blood (single)     Status: None   Collection Time: 03/29/21  9:30 PM   Specimen: BLOOD  Result Value Ref Range Status   Specimen Description   Final    BLOOD RIGHT ANTECUBITAL Performed at [facility], [address], [place], [place] [address]    Special Requests   Final    BOTTLES DRAWN AEROBIC AND ANAEROBIC Blood Culture adequate volume Performed at [facility], [address], [place], [place] [address]    Culture   Final    NO GROWTH 5 DAYS Performed at [facility], [address] [place], [place] [address]    Report Status 04/03/2021 FINAL  Final  MRSA Next Gen by PCR, Nasal     Status: Abnormal   Collection Time: 03/30/21 12:00 AM   Specimen: Nasal Mucosa; Nasal Swab  Result Value Ref Range Status   MRSA by PCR Next Gen DETECTED (A) NOT DETECTED Final    Comment: RESULT CALLED TO, READ BACK BY AND VERIFIED WITH: KAREN, RN @ 0403 ON 03/30/21 C VARNER (NOTE) The GeneXpert MRSA Assay (FDA approved for NASAL specimens only), is one component of a comprehensive MRSA colonization surveillance program. It is not intended to diagnose MRSA infection nor to guide or monitor treatment for MRSA infections. Test performance is not FDA approved in patients less than 54  years old. Performed at [facility], [address], [place], [place] [address]   Culture, blood (x 2)     Status: None   Collection Time: 03/30/21  1:53 AM   Specimen: BLOOD  Result Value Ref Range Status   Specimen Description   Final    BLOOD BLOOD LEFT HAND Performed at [facility], [address], [place], [place] [address]    Special Requests   Final    BOTTLES DRAWN AEROBIC ONLY Blood Culture adequate volume Performed at [facility], [address] [place], [place] [address]    Culture   Final    NO GROWTH 5 DAYS Performed at [facility], [address], [place], [place] [address]    Report Status 04/04/2021 FINAL  Final  Culture, blood (x 2)     Status: None   Collection Time: 03/30/21  3:11 AM   Specimen: BLOOD  Result Value Ref Range Status   Specimen Description   Final    BLOOD BLOOD LEFT HAND Performed at [facility], [address], [place], [place] [address]    Special Requests   Final    BOTTLES DRAWN AEROBIC ONLY Blood Culture adequate volume Performed at [facility], [address], [place], [place] [address]    Culture   Final    NO GROWTH 5 DAYS Performed at [facility], [address], [place], [place] [address]    Report Status 04/04/2021 FINAL  Final     Time coordinating discharge: 35 minutes  SIGNED:   Glade Lloyd, MD  [facility] 04/07/2021, 11:39 AM

## 2021-04-07 NOTE — Consult Note (Signed)
[facility] Face-to-Face Psychiatry Consult   Reason for Consult: " Aggressive behavior and threatening/demeaning language to staff in a patient who carries history of bipolar disorder."  Referring Physician:  Celine Mans Patient Identification: Becky Hendricks MRN:  [id number] Principal Diagnosis: Severe sepsis [facility]) Diagnosis:  Principal Problem:   Severe sepsis (HCC) Active Problems:   Metabolic encephalopathy   Urinary tract infection without hematuria   AF (paroxysmal atrial fibrillation) (HCC)   Sacral wound   Bipolar disorder (HCC)   Type 2 diabetes mellitus with hyperglycemia, with long-term current use of insulin (HCC)   Encephalopathy   Acute respiratory failure with hypoxia (HCC)   Hyponatremia   Functional quadriplegia (HCC)   Pneumonia of lower lobe due to infectious organism   Colitis   Acute respiratory failure (HCC)   Acute on chronic respiratory failure with hypoxia and hypercapnia (HCC)  Total Time spent with patient: 20 minutes   Subjective: I am doing okay.  Objective: Patient is seen, chart reviewed by this nurse practitioner.  She is observed to be sleeping upon entry into the room, however she does awake easily.  When awoken she is able to remain attentive, and is alert and oriented x3.  On today's evaluation she further denies any psychosis, hallucinations, and or delusions. She denies any suicidal ideations, homicidal ideations, and or auditory or visual hallucinations.  Furthermore she does not appear to be exhibiting any delusions, and or responding to internal and external stimuli.    Past Psychiatric History: bipolar.  Was previously taking Depakote 500 mg p.o. twice daily, Risperdal 2 mg p.o. twice daily.  Risk to Self:  none Risk to Others:  none Prior Inpatient Therapy:  unknown Prior Outpatient Therapy:  unknown  Past Medical History:  Past Medical History:  Diagnosis Date   Atrial fibrillation (HCC)    Bipolar disorder (HCC)    Bipolar disorder (HCC)  04/27/2020   Clostridium difficile diarrhea 08/02/2019   Dehydration    Essential hypertension    Fall 02/11/2020   Morbid obesity (HCC)    Osteomyelitis (HCC) 12/03/2019   Pneumonia due to COVID-19 virus 09/15/2019   Type 2 diabetes mellitus (HCC)    Weakness     Past Surgical History:  Procedure Laterality Date   INCISION AND DRAINAGE PERIRECTAL ABSCESS Left 12/04/2019   Procedure: IRRIGATION AND DEBRIDEMENT LEFT BUTTOCK ABSCESS;  Surgeon: Violeta Gelinas, MD;  Location: [facility] OR;  Service: General;  Laterality: Left;   INCISION AND DRAINAGE PERIRECTAL ABSCESS Left 12/10/2019   Procedure: REPEAT IRRIGATION AND DEBRIDEMENT BUTTOCK  ABSCESS;  Surgeon: Abigail Miyamoto, MD;  Location: [facility] OR;  Service: General;  Laterality: Left;   IR FLUORO GUIDE CV LINE RIGHT  11/25/2020   IR RADIOLOGIST EVAL & MGMT  12/29/2020   IR US GUIDE VASC ACCESS RIGHT  11/25/2020   Family History:  Family History  Family history unknown: Yes   Family Psychiatric  History: unknown Social History:  Social History   Substance and Sexual Activity  Alcohol Use Not Currently     Social History   Substance and Sexual Activity  Drug Use Not Currently    Social History   Socioeconomic History   Marital status: Single    Spouse name: Not on file   Number of children: Not on file   Years of education: Not on file   Highest education level: Not on file  Occupational History   Not on file  Tobacco Use   Smoking status: Never   Smokeless tobacco: Never  Vaping  Use   Vaping Use: Unknown  Substance and Sexual Activity   Alcohol use: Not Currently   Drug use: Not Currently   Sexual activity: Not Currently    Birth control/protection: Post-menopausal  Other Topics Concern   Not on file  Social History Narrative   Not on file   Social Determinants of Health   Financial Resource Strain: Not on file  Food Insecurity: Not on file  Transportation Needs: Not on file  Physical Activity: Not on file  Stress: Not  on file  Social Connections: Not on file   Additional Social History:    Allergies:   Allergies  Allergen Reactions   Chlorhexidine Gluconate Itching   Vancomycin     Vancomycin infusion related reaction- May need Vanc to be given at a slower rate    Acetazolamide Er Rash    Labs:  Results for orders placed or performed during the hospital encounter of 03/29/21 (from the past 48 hour(s))  Glucose, capillary     Status: Abnormal   Collection Time: 04/05/21  1:03 PM  Result Value Ref Range   Glucose-Capillary 177 (H) 70 - 99 mg/dL    Comment: Glucose reference range applies only to samples taken after fasting for at least 8 hours.   Comment 1 Notify RN    Comment 2 Document in Chart   Glucose, capillary     Status: Abnormal   Collection Time: 04/05/21  4:47 PM  Result Value Ref Range   Glucose-Capillary 229 (H) 70 - 99 mg/dL    Comment: Glucose reference range applies only to samples taken after fasting for at least 8 hours.   Comment 1 Notify RN    Comment 2 Document in Chart   Phosphorus     Status: None   Collection Time: 04/06/21  4:28 AM  Result Value Ref Range   Phosphorus 3.1 2.5 - 4.6 mg/dL    Comment: Performed at [facility], [address] [place], [place] [address]  CBC with Differential/Platelet     Status: Abnormal   Collection Time: 04/06/21  4:28 AM  Result Value Ref Range   WBC 15.5 (H) 4.0 - 10.5 K/uL   RBC 3.57 (L) 3.87 - 5.11 MIL/uL   Hemoglobin 10.1 (L) 12.0 - 15.0 g/dL   HCT 60.433.5 (L) 54.036.0 - 98.146.0 %   MCV 93.8 80.0 - 100.0 fL   MCH 28.3 26.0 - 34.0 pg   MCHC 30.1 30.0 - 36.0 g/dL   RDW 19.114.8 47.811.5 - 29.515.5 %   Platelets 196 150 - 400 K/uL   nRBC 0.0 0.0 - 0.2 %   Neutrophils Relative % 74 %   Neutro Abs 11.4 (H) 1.7 - 7.7 K/uL   Lymphocytes Relative 16 %   Lymphs Abs 2.5 0.7 - 4.0 K/uL   Monocytes Relative 8 %   Monocytes Absolute 1.3 (H) 0.1 - 1.0 K/uL   Eosinophils Relative 1 %   Eosinophils Absolute 0.2 0.0 - 0.5 K/uL    Basophils Relative 0 %   Basophils Absolute 0.0 0.0 - 0.1 K/uL   Immature Granulocytes 1 %   Abs Immature Granulocytes 0.10 (H) 0.00 - 0.07 K/uL    Comment: Performed at [facility], [address] [place], [place] [address]  Basic metabolic panel     Status: Abnormal   Collection Time: 04/06/21  4:28 AM  Result Value Ref Range   Sodium 141 135 - 145 mmol/L   Potassium 3.6 3.5 - 5.1  mmol/L   Chloride 94 (L) 98 - 111 mmol/L   CO2 38 (H) 22 - 32 mmol/L   Glucose, Bld 226 (H) 70 - 99 mg/dL    Comment: Glucose reference range applies only to samples taken after fasting for at least 8 hours.   BUN 25 (H) 8 - 23 mg/dL   Creatinine, Ser 1.61 0.44 - 1.00 mg/dL   Calcium 8.4 (L) 8.9 - 10.3 mg/dL   GFR, Estimated >09 >60 mL/min    Comment: (NOTE) Calculated using the CKD-EPI Creatinine Equation (2021)    Anion gap 9 5 - 15    Comment: Performed at [facility], [address] [place], [place] [address]  Magnesium     Status: None   Collection Time: 04/06/21  4:28 AM  Result Value Ref Range   Magnesium 1.9 1.7 - 2.4 mg/dL    Comment: Performed at [facility], [address] [place], [place] [address]  Glucose, capillary     Status: Abnormal   Collection Time: 04/06/21  7:35 AM  Result Value Ref Range   Glucose-Capillary 142 (H) 70 - 99 mg/dL    Comment: Glucose reference range applies only to samples taken after fasting for at least 8 hours.  Glucose, capillary     Status: Abnormal   Collection Time: 04/06/21 11:49 AM  Result Value Ref Range   Glucose-Capillary 188 (H) 70 - 99 mg/dL    Comment: Glucose reference range applies only to samples taken after fasting for at least 8 hours.  Glucose, capillary     Status: Abnormal   Collection Time: 04/06/21  4:14 PM  Result Value Ref Range   Glucose-Capillary 244 (H) 70 - 99 mg/dL    Comment: Glucose reference range applies only to samples taken after fasting for at least 8 hours.   CBC with Differential/Platelet     Status: Abnormal   Collection Time: 04/07/21  4:52 AM  Result Value Ref Range   WBC 14.7 (H) 4.0 - 10.5 K/uL   RBC 3.75 (L) 3.87 - 5.11 MIL/uL   Hemoglobin 10.5 (L) 12.0 - 15.0 g/dL   HCT 47.8 (L) 29.5 - 62.1 %   MCV 93.9 80.0 - 100.0 fL   MCH 28.0 26.0 - 34.0 pg   MCHC 29.8 (L) 30.0 - 36.0 g/dL   RDW 30.8 65.7 - 84.6 %   Platelets 196 150 - 400 K/uL   nRBC 0.0 0.0 - 0.2 %   Neutrophils Relative % 59 %   Neutro Abs 8.7 (H) 1.7 - 7.7 K/uL   Lymphocytes Relative 27 %   Lymphs Abs 3.9 0.7 - 4.0 K/uL   Monocytes Relative 10 %   Monocytes Absolute 1.4 (H) 0.1 - 1.0 K/uL   Eosinophils Relative 3 %   Eosinophils Absolute 0.5 0.0 - 0.5 K/uL   Basophils Relative 0 %   Basophils Absolute 0.0 0.0 - 0.1 K/uL   Immature Granulocytes 1 %   Abs Immature Granulocytes 0.15 (H) 0.00 - 0.07 K/uL    Comment: Performed at [facility], [address], [place], [place] [address]  Basic metabolic panel     Status: Abnormal   Collection Time: 04/07/21  4:52 AM  Result Value Ref Range   Sodium 139 135 - 145 mmol/L   Potassium 3.9 3.5 - 5.1 mmol/L   Chloride 93 (L) 98 - 111 mmol/L   CO2 41 (H) 22 - 32 mmol/L   Glucose, Bld 151 (H) 70 -  99 mg/dL    Comment: Glucose reference range applies only to samples taken after fasting for at least 8 hours.   BUN 23 8 - 23 mg/dL   Creatinine, Ser 0.25 0.44 - 1.00 mg/dL   Calcium 8.7 (L) 8.9 - 10.3 mg/dL   GFR, Estimated >85 >27 mL/min    Comment: (NOTE) Calculated using the CKD-EPI Creatinine Equation (2021)    Anion gap 5 5 - 15    Comment: Performed at [facility], [address], [place], [place] [address]  Magnesium     Status: None   Collection Time: 04/07/21  4:52 AM  Result Value Ref Range   Magnesium 1.8 1.7 - 2.4 mg/dL    Comment: Performed at [facility], [address], [place], [place] [address]  Glucose, capillary     Status: Abnormal   Collection  Time: 04/07/21  6:57 AM  Result Value Ref Range   Glucose-Capillary 139 (H) 70 - 99 mg/dL    Comment: Glucose reference range applies only to samples taken after fasting for at least 8 hours.    Current Facility-Administered Medications  Medication Dose Route Frequency Provider Last Rate Last Admin   0.9 %  sodium chloride infusion   Intravenous PRN Hunsucker, Lesia Sago, MD 10 mL/hr at 04/05/21 0300 Infusion Verify at 04/05/21 0300   0.9 %  sodium chloride infusion  250 mL Intravenous Continuous Karl Ito, MD   Stopped at 04/02/21 [phone]   acetaminophen (TYLENOL) tablet 650 mg  650 mg Oral Q6H PRN Canary Brim L, NP   650 mg at 04/01/21 5400   Or   acetaminophen (TYLENOL) suppository 650 mg  650 mg Rectal Q6H PRN Jeanella Craze, NP       apixaban (ELIQUIS) tablet 5 mg  5 mg Oral BID Ollis, Brandi L, NP   5 mg at 04/06/21 2124   atorvastatin (LIPITOR) tablet 10 mg  10 mg Oral Daily Doutova, Anastassia, MD   10 mg at 04/06/21 1025   carvedilol (COREG) tablet 6.25 mg  6.25 mg Oral BID WC Rodolph Bong, MD   6.25 mg at 04/07/21 0735   divalproex (DEPAKOTE) DR tablet 500 mg  500 mg Oral Q12H Rodolph Bong, MD   500 mg at 04/06/21 2124   ferrous sulfate tablet 325 mg  325 mg Oral Daily Rodolph Bong, MD   325 mg at 04/06/21 1025   fluticasone (FLONASE) 50 MCG/ACT nasal spray 2 spray  2 spray Each Nare Daily Rodolph Bong, MD   2 spray at 04/06/21 1028   insulin aspart (novoLOG) injection 0-9 Units  0-9 Units Subcutaneous TID WC Rodolph Bong, MD   1 Units at 04/07/21 0736   levalbuterol (XOPENEX) nebulizer solution 0.63 mg  0.63 mg Nebulization Q6H PRN Rodolph Bong, MD       loratadine (CLARITIN) tablet 10 mg  10 mg Oral Daily Rodolph Bong, MD   10 mg at 04/06/21 1025   MEDLINE mouth rinse  15 mL Mouth Rinse BID Hunsucker, Lesia Sago, MD   15 mL at 04/06/21 2124   metoprolol tartrate (LOPRESSOR) injection 5 mg  5 mg Intravenous Q2H PRN Rodolph Bong,  MD   5 mg at 04/04/21 2225   ondansetron [facility]) injection 4 mg  4 mg Intravenous Q6H PRN Kirtland Bouchard, MD       pantoprazole (PROTONIX) EC tablet 40 mg  40 mg Oral Daily Rodolph Bong,  MD   40 mg at 04/06/21 1025   polyethylene glycol (MIRALAX / GLYCOLAX) packet 17 g  17 g Oral BID Rodolph Bong, MD   17 g at 04/06/21 1024   predniSONE (DELTASONE) tablet 30 mg  30 mg Oral Q breakfast Glade Lloyd, MD   30 mg at 04/07/21 0735   risperiDONE (RISPERDAL) tablet 1 mg  1 mg Oral BID Maryagnes Amos, FNP   1 mg at 04/06/21 2124   senna-docusate (Senokot-S) tablet 2 tablet  2 tablet Oral QHS Rodolph Bong, MD        Musculoskeletal: Strength & Muscle Tone: within normal limits Gait & Station:  did not witness Patient leans: N/A  Psychiatric Specialty Exam: Physical Exam Vitals and nursing note reviewed.  Constitutional:      Appearance: Normal appearance.  HENT:     Head: Normocephalic.     Nose: Nose normal.  Pulmonary:     Effort: Pulmonary effort is normal.  Neurological:     General: No focal deficit present.     Mental Status: She is alert.  Psychiatric:        Attention and Perception: Perception normal.        Mood and Affect: Mood and affect normal.        Speech: Speech normal.        Behavior: Behavior is cooperative.        Thought Content: Thought content normal.        Cognition and Memory: Memory normal.        Judgment: Judgment is impulsive.    Review of Systems  Psychiatric/Behavioral: Negative.    All other systems reviewed and are negative.  Blood pressure (!) 149/75, pulse (!) 59, temperature 98.2 F (36.8 C), temperature source Oral, resp. rate 20, height  (1.676 m), weight 107.7 kg, SpO2 98 %.Body mass index is 38.32 kg/m.  General Appearance: Casual  Eye Contact:  Fair  Speech:  Normal Rate  Volume:  Normal  Mood:  Euthymic  Affect:  Congruent  Thought Process:  Coherent and Linear  Orientation:  Full (Time,  Place, and Person)  Thought Content:  Logical  Suicidal Thoughts:  No  Homicidal Thoughts:  No  Memory:  Immediate;   Fair Recent;   Fair  Judgement:  Intact  Insight:  Present  Psychomotor Activity:  Normal  Concentration:  Concentration: Fair and Attention Span: Fair  Recall:  [employer] of Knowledge:  Fair  Language:  Fair  Akathisia:  No  Handed:  Right  AIMS (if indicated):     Assets:  Desire for Improvement Housing Social Support  ADL's:  Impaired  Cognition:  Impaired,  Mild  Sleep:      Physical Exam: Physical Exam Vitals and nursing note reviewed.  Constitutional:      Appearance: Normal appearance.  HENT:     Head: Normocephalic.     Nose: Nose normal.  Pulmonary:     Effort: Pulmonary effort is normal.  Neurological:     General: No focal deficit present.     Mental Status: She is alert.  Psychiatric:        Attention and Perception: Perception normal.        Mood and Affect: Mood and affect normal.        Speech: Speech normal.        Behavior: Behavior is cooperative.        Thought Content: Thought content normal.  Cognition and Memory: Memory normal.        Judgment: Judgment is impulsive.   Review of Systems  Psychiatric/Behavioral: Negative.    All other systems reviewed and are negative. Blood pressure (!) 149/75, pulse (!) 59, temperature 98.2 F (36.8 C), temperature source Oral, resp. rate 20, height  (1.676 m), weight 107.7 kg, SpO2 98 %. Body mass index is 38.32 kg/m.  Treatment Plan Summary:  75 year old female with a history of bipolar disorder, was previously taking Depakote and Risperdal.  She presented to Hospital for generalized weakness x2 weeks, encephalopathy from [facility] rehabilitation.   She denies any delusions, suicidal thoughts, and appears to be showing favorable response to current psychiatric medications.  Patient's current presentation is likely due to encephalopathy, and rapid discontinuation of her psychotropic  medications has likely caused a relapse of her bipolar disorder.    Recommendations: -Continue Depakote 500 mg BID for aggression/agitation. Depakote level to be ordered on 04/07/21.  Depakote level 40.  -Continue Risperdal 1 mg twice daily for psychosis, aggression, and agitation.  Psych cleared at this time.   Disposition: No evidence of imminent risk to self or others at present.   Supportive therapy provided about ongoing stressors.  Maryagnes Amos, FNP 04/07/2021 9:41 AM

## 2021-04-07 NOTE — Care Management Important Message (Signed)
Medicare IM printed for [place] [facility] to give to the patient. 

## 2021-04-07 NOTE — Progress Notes (Signed)
Assumed care of patient from previous nurse. Agree with previous nurses assessment. Will continue to monitor.  

## 2021-04-07 NOTE — Plan of Care (Signed)
  Problem: Activity: Goal: Risk for activity intolerance will decrease Outcome: Progressing   Problem: Skin Integrity: Goal: Risk for impaired skin integrity will decrease Outcome: Progressing   Problem: Safety: Goal: Ability to remain free from injury will improve Outcome: Progressing   

## 2021-04-10 ENCOUNTER — Other Ambulatory Visit: Payer: Self-pay

## 2021-04-10 ENCOUNTER — Inpatient Hospital Stay (HOSPITAL_COMMUNITY)
Admission: EM | Admit: 2021-04-10 | Discharge: 2021-04-19 | DRG: 189 | Disposition: A | Discharge status: Skilled Nursing Facility | Payer: Medicare Other | Source: Skilled Nursing Facility | Attending: Internal Medicine | Admitting: Internal Medicine

## 2021-04-10 ENCOUNTER — Emergency Department (HOSPITAL_COMMUNITY): Payer: Medicare Other

## 2021-04-10 ENCOUNTER — Encounter (HOSPITAL_COMMUNITY): Payer: Self-pay | Admitting: Emergency Medicine

## 2021-04-10 DIAGNOSIS — I5032 Chronic diastolic (congestive) heart failure: Secondary | ICD-10-CM | POA: Diagnosis present

## 2021-04-10 DIAGNOSIS — R32 Unspecified urinary incontinence: Secondary | ICD-10-CM | POA: Diagnosis present

## 2021-04-10 DIAGNOSIS — R0689 Other abnormalities of breathing: Secondary | ICD-10-CM

## 2021-04-10 DIAGNOSIS — Z9119 Patient's noncompliance with other medical treatment and regimen: Secondary | ICD-10-CM | POA: Diagnosis not present

## 2021-04-10 DIAGNOSIS — R4587 Impulsiveness: Secondary | ICD-10-CM | POA: Diagnosis present

## 2021-04-10 DIAGNOSIS — Z6838 Body mass index (BMI) 38.0-38.9, adult: Secondary | ICD-10-CM

## 2021-04-10 DIAGNOSIS — E872 Acidosis: Secondary | ICD-10-CM | POA: Diagnosis present

## 2021-04-10 DIAGNOSIS — I110 Hypertensive heart disease with heart failure: Secondary | ICD-10-CM | POA: Diagnosis present

## 2021-04-10 DIAGNOSIS — Z794 Long term (current) use of insulin: Secondary | ICD-10-CM

## 2021-04-10 DIAGNOSIS — Z20822 Contact with and (suspected) exposure to covid-19: Secondary | ICD-10-CM | POA: Diagnosis present

## 2021-04-10 DIAGNOSIS — J9622 Acute and chronic respiratory failure with hypercapnia: Principal | ICD-10-CM | POA: Diagnosis present

## 2021-04-10 DIAGNOSIS — F3111 Bipolar disorder, current episode manic without psychotic features, mild: Secondary | ICD-10-CM | POA: Diagnosis present

## 2021-04-10 DIAGNOSIS — Z7901 Long term (current) use of anticoagulants: Secondary | ICD-10-CM

## 2021-04-10 DIAGNOSIS — F319 Bipolar disorder, unspecified: Secondary | ICD-10-CM | POA: Diagnosis present

## 2021-04-10 DIAGNOSIS — L89151 Pressure ulcer of sacral region, stage 1: Secondary | ICD-10-CM | POA: Diagnosis present

## 2021-04-10 DIAGNOSIS — Z881 Allergy status to other antibiotic agents status: Secondary | ICD-10-CM

## 2021-04-10 DIAGNOSIS — J9621 Acute and chronic respiratory failure with hypoxia: Secondary | ICD-10-CM | POA: Diagnosis present

## 2021-04-10 DIAGNOSIS — J9602 Acute respiratory failure with hypercapnia: Secondary | ICD-10-CM | POA: Diagnosis not present

## 2021-04-10 DIAGNOSIS — G40909 Epilepsy, unspecified, not intractable, without status epilepticus: Secondary | ICD-10-CM | POA: Diagnosis present

## 2021-04-10 DIAGNOSIS — G9341 Metabolic encephalopathy: Secondary | ICD-10-CM | POA: Diagnosis present

## 2021-04-10 DIAGNOSIS — Z888 Allergy status to other drugs, medicaments and biological substances status: Secondary | ICD-10-CM

## 2021-04-10 DIAGNOSIS — Z7189 Other specified counseling: Secondary | ICD-10-CM | POA: Diagnosis not present

## 2021-04-10 DIAGNOSIS — Z8616 Personal history of COVID-19: Secondary | ICD-10-CM

## 2021-04-10 DIAGNOSIS — I952 Hypotension due to drugs: Secondary | ICD-10-CM | POA: Diagnosis not present

## 2021-04-10 DIAGNOSIS — E662 Morbid (severe) obesity with alveolar hypoventilation: Secondary | ICD-10-CM | POA: Diagnosis present

## 2021-04-10 DIAGNOSIS — T426X5A Adverse effect of other antiepileptic and sedative-hypnotic drugs, initial encounter: Secondary | ICD-10-CM | POA: Diagnosis present

## 2021-04-10 DIAGNOSIS — J9601 Acute respiratory failure with hypoxia: Secondary | ICD-10-CM | POA: Diagnosis not present

## 2021-04-10 DIAGNOSIS — E1165 Type 2 diabetes mellitus with hyperglycemia: Secondary | ICD-10-CM | POA: Diagnosis present

## 2021-04-10 DIAGNOSIS — Z8614 Personal history of Methicillin resistant Staphylococcus aureus infection: Secondary | ICD-10-CM

## 2021-04-10 DIAGNOSIS — R4182 Altered mental status, unspecified: Secondary | ICD-10-CM | POA: Diagnosis not present

## 2021-04-10 DIAGNOSIS — Z7984 Long term (current) use of oral hypoglycemic drugs: Secondary | ICD-10-CM

## 2021-04-10 DIAGNOSIS — Z79899 Other long term (current) drug therapy: Secondary | ICD-10-CM

## 2021-04-10 DIAGNOSIS — E669 Obesity, unspecified: Secondary | ICD-10-CM | POA: Diagnosis present

## 2021-04-10 DIAGNOSIS — I4821 Permanent atrial fibrillation: Secondary | ICD-10-CM | POA: Diagnosis present

## 2021-04-10 DIAGNOSIS — Z8674 Personal history of sudden cardiac arrest: Secondary | ICD-10-CM

## 2021-04-10 DIAGNOSIS — R0902 Hypoxemia: Secondary | ICD-10-CM | POA: Diagnosis present

## 2021-04-10 DIAGNOSIS — Z7952 Long term (current) use of systemic steroids: Secondary | ICD-10-CM

## 2021-04-10 DIAGNOSIS — J9620 Acute and chronic respiratory failure, unspecified whether with hypoxia or hypercapnia: Secondary | ICD-10-CM | POA: Diagnosis present

## 2021-04-10 DIAGNOSIS — G9340 Encephalopathy, unspecified: Secondary | ICD-10-CM | POA: Diagnosis not present

## 2021-04-10 DIAGNOSIS — Z515 Encounter for palliative care: Secondary | ICD-10-CM | POA: Diagnosis not present

## 2021-04-10 DIAGNOSIS — E876 Hypokalemia: Secondary | ICD-10-CM | POA: Diagnosis not present

## 2021-04-10 HISTORY — DX: Acute and chronic respiratory failure with hypercapnia: J96.22

## 2021-04-10 LAB — CBC WITH DIFFERENTIAL/PLATELET
Abs Immature Granulocytes: 0.61 10*3/uL — ABNORMAL HIGH (ref 0.00–0.07)
Basophils Absolute: 0.2 10*3/uL — ABNORMAL HIGH (ref 0.0–0.1)
Basophils Relative: 1 %
Eosinophils Absolute: 0.6 10*3/uL — ABNORMAL HIGH (ref 0.0–0.5)
Eosinophils Relative: 3 %
HCT: 39.7 % (ref 36.0–46.0)
Hemoglobin: 11.9 g/dL — ABNORMAL LOW (ref 12.0–15.0)
Immature Granulocytes: 3 %
Lymphocytes Relative: 13 %
Lymphs Abs: 2.4 10*3/uL (ref 0.7–4.0)
MCH: 28.7 pg (ref 26.0–34.0)
MCHC: 30 g/dL (ref 30.0–36.0)
MCV: 95.7 fL (ref 80.0–100.0)
Monocytes Absolute: 1.9 10*3/uL — ABNORMAL HIGH (ref 0.1–1.0)
Monocytes Relative: 10 %
Neutro Abs: 13.4 10*3/uL — ABNORMAL HIGH (ref 1.7–7.7)
Neutrophils Relative %: 70 %
Platelets: 190 10*3/uL (ref 150–400)
RBC: 4.15 MIL/uL (ref 3.87–5.11)
RDW: 14.1 % (ref 11.5–15.5)
WBC: 19.1 10*3/uL — ABNORMAL HIGH (ref 4.0–10.5)
nRBC: 0 % (ref 0.0–0.2)

## 2021-04-10 LAB — I-STAT ARTERIAL BLOOD GAS, ED
Acid-Base Excess: 23 mmol/L — ABNORMAL HIGH (ref 0.0–2.0)
Bicarbonate: 54.4 mmol/L — ABNORMAL HIGH (ref 20.0–28.0)
Calcium, Ion: 1.24 mmol/L (ref 1.15–1.40)
HCT: 37 % (ref 36.0–46.0)
Hemoglobin: 12.6 g/dL (ref 12.0–15.0)
O2 Saturation: 95 %
Patient temperature: 99.1
Potassium: 3.8 mmol/L (ref 3.5–5.1)
Sodium: 138 mmol/L (ref 135–145)
TCO2: >50 mmol/L — ABNORMAL HIGH (ref 22–32)
pCO2 arterial: 107.9 mmHg (ref 32.0–48.0)
pH, Arterial: 7.312 — ABNORMAL LOW (ref 7.350–7.450)
pO2, Arterial: 95 mmHg (ref 83.0–108.0)

## 2021-04-10 LAB — CBG MONITORING, ED: Glucose-Capillary: 151 mg/dL — ABNORMAL HIGH (ref 70–99)

## 2021-04-10 LAB — PROCALCITONIN: Procalcitonin: <0.1 ng/mL

## 2021-04-10 LAB — COMPREHENSIVE METABOLIC PANEL
ALT: 8 U/L (ref 0–44)
AST: 12 U/L — ABNORMAL LOW (ref 15–41)
Albumin: 2.5 g/dL — ABNORMAL LOW (ref 3.5–5.0)
Alkaline Phosphatase: 35 U/L — ABNORMAL LOW (ref 38–126)
Anion gap: 4 — ABNORMAL LOW (ref 5–15)
BUN: 18 mg/dL (ref 8–23)
CO2: 47 mmol/L — ABNORMAL HIGH (ref 22–32)
Calcium: 8.7 mg/dL — ABNORMAL LOW (ref 8.9–10.3)
Chloride: 87 mmol/L — ABNORMAL LOW (ref 98–111)
Creatinine, Ser: 0.44 mg/dL (ref 0.44–1.00)
GFR, Estimated: >60 mL/min (ref >60)
Glucose, Bld: 186 mg/dL — ABNORMAL HIGH (ref 70–99)
Potassium: 4.3 mmol/L (ref 3.5–5.1)
Sodium: 138 mmol/L (ref 135–145)
Total Bilirubin: 0.7 mg/dL (ref 0.3–1.2)
Total Protein: 5.4 g/dL — ABNORMAL LOW (ref 6.5–8.1)

## 2021-04-10 LAB — GLUCOSE, CAPILLARY
Glucose-Capillary: 125 mg/dL — ABNORMAL HIGH (ref 70–99)
Glucose-Capillary: 145 mg/dL — ABNORMAL HIGH (ref 70–99)
Glucose-Capillary: 190 mg/dL — ABNORMAL HIGH (ref 70–99)

## 2021-04-10 LAB — VALPROIC ACID LEVEL: Valproic Acid Lvl: 36 ug/mL — ABNORMAL LOW (ref 50.0–100.0)

## 2021-04-10 LAB — PROTIME-INR
INR: 1.1 (ref 0.8–1.2)
Prothrombin Time: 14.2 seconds (ref 11.4–15.2)

## 2021-04-10 LAB — RESP PANEL BY RT-PCR (FLU A&B, COVID) ARPGX2
Influenza A by PCR: NEGATIVE
Influenza B by PCR: NEGATIVE
SARS Coronavirus 2 by RT PCR: NEGATIVE

## 2021-04-10 LAB — BRAIN NATRIURETIC PEPTIDE: B Natriuretic Peptide: 221.3 pg/mL — ABNORMAL HIGH (ref 0.0–100.0)

## 2021-04-10 LAB — TROPONIN I (HIGH SENSITIVITY)
Troponin I (High Sensitivity): 34 ng/L — ABNORMAL HIGH (ref <18)
Troponin I (High Sensitivity): 35 ng/L — ABNORMAL HIGH (ref <18)

## 2021-04-10 LAB — LACTIC ACID, PLASMA: Lactic Acid, Venous: 1.2 mmol/L (ref 0.5–1.9)

## 2021-04-10 IMAGING — CT CT HEAD W/O CM
3 series · 15 of 47 positions shown, 18 images · non-contrast
Comparison: [DATE]

CLINICAL DATA: Altered mental status.  Unable to move right arm.

EXAM:
CT HEAD WITHOUT CONTRAST
TECHNIQUE: Contiguous axial images were obtained from the base of the skull
through the vertex without intravenous contrast.

[Series 4: head 5.0 h30s · axial · 0.44mm/px · z∈[-117,+18]mm · 9 of 33 slices shown, 12 images]
[im 3/33  brain]
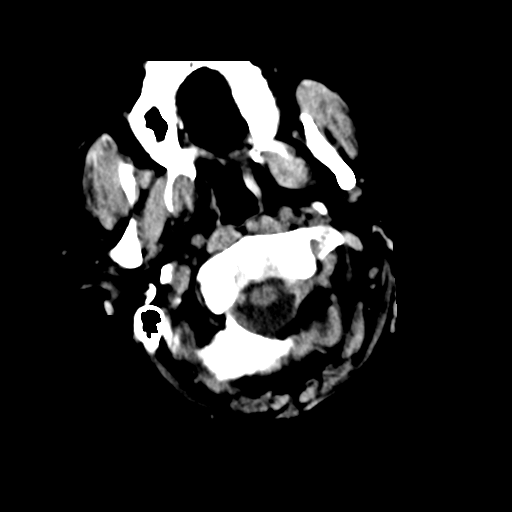
[im 3/33  bone]
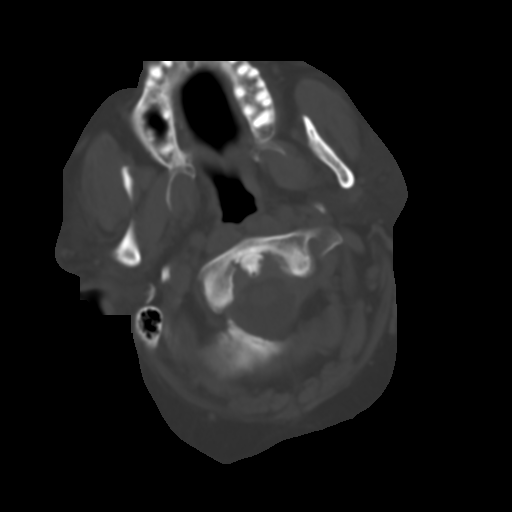
[im 6/33  brain]
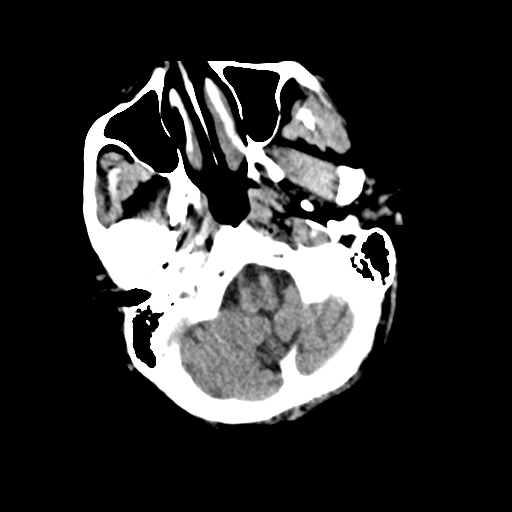
[im 9/33  brain]
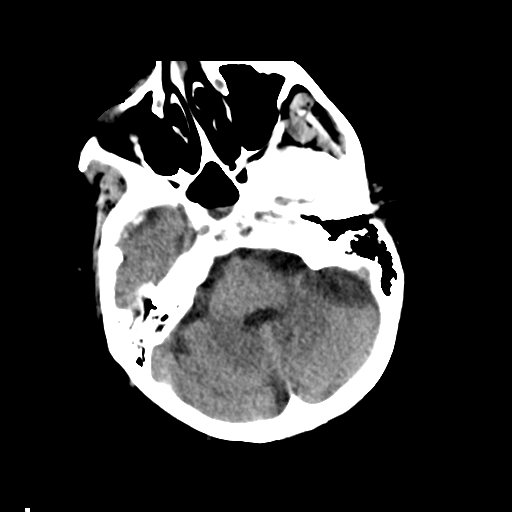
[im 13/33  brain]
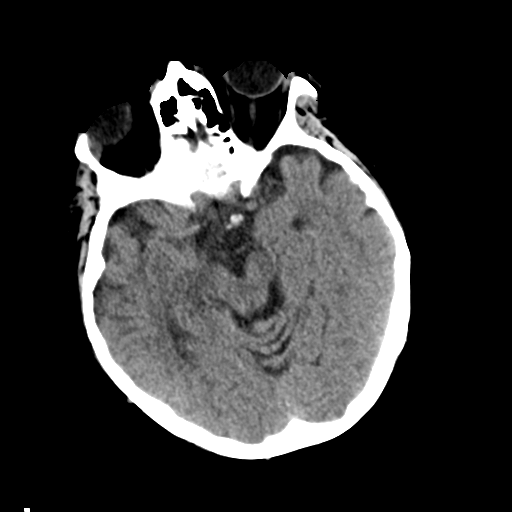
[im 17/33  brain]
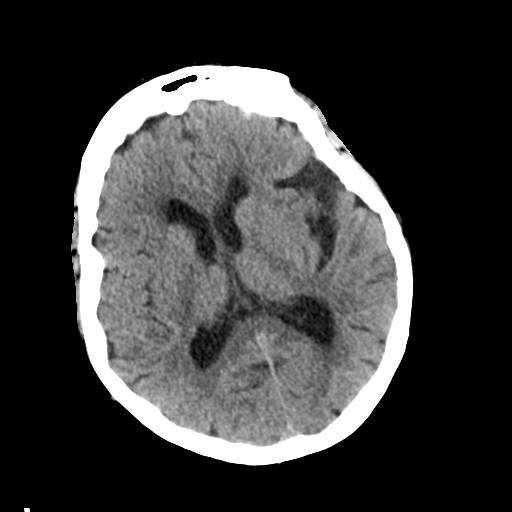
[im 17/33  bone]
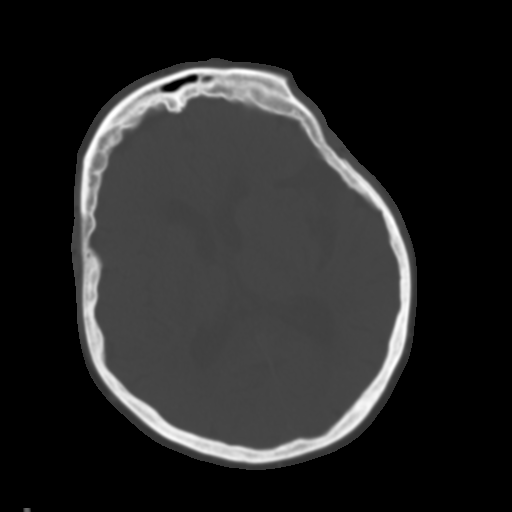
[im 20/33  brain]
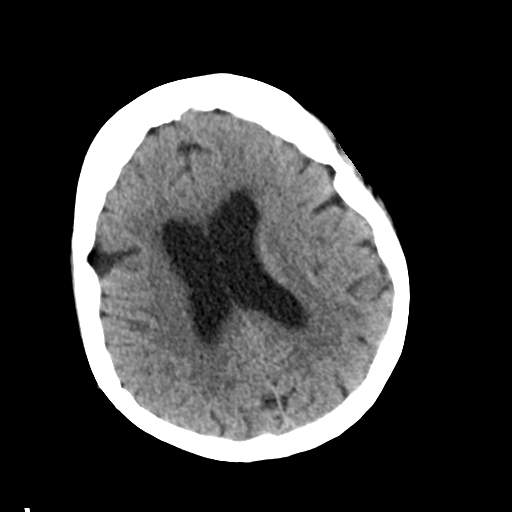
[im 24/33  brain]
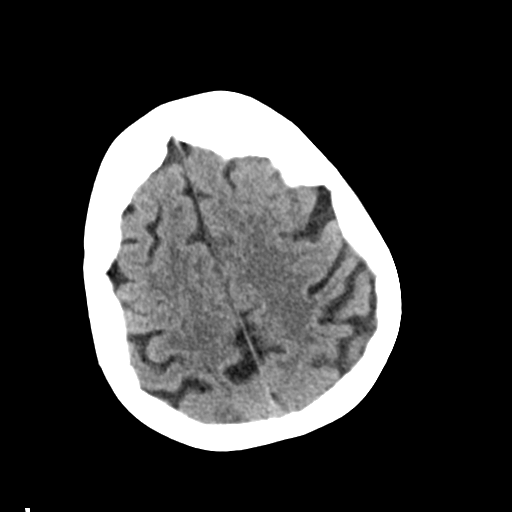
[im 27/33  brain]
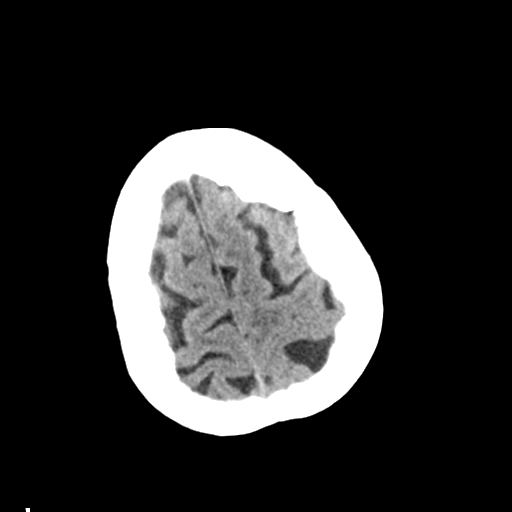
[im 30/33  brain]
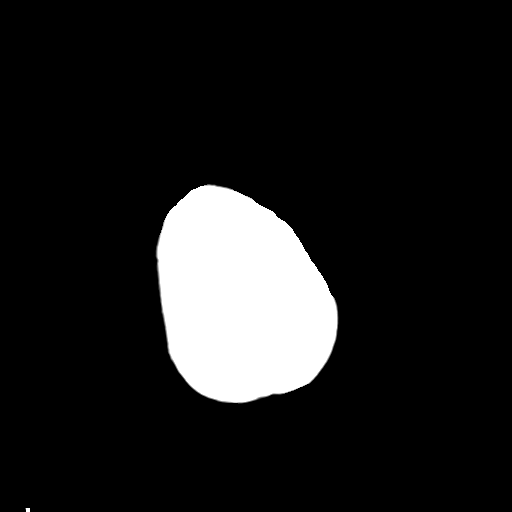
[im 30/33  bone]
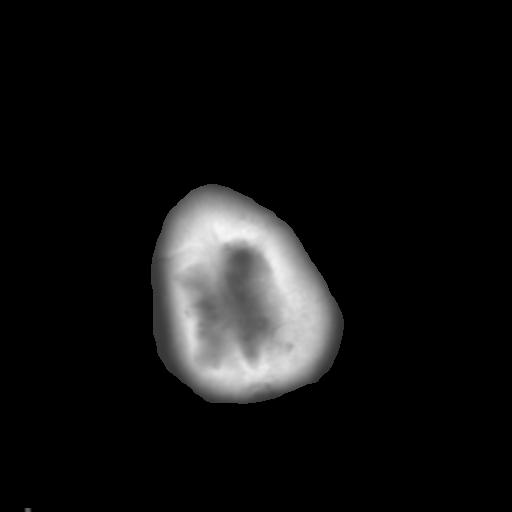

[Series 5: head 3.0 mpr cor · coronal · 0.34mm/px · 3 of 69 slices shown]
[im 23/69  brain]
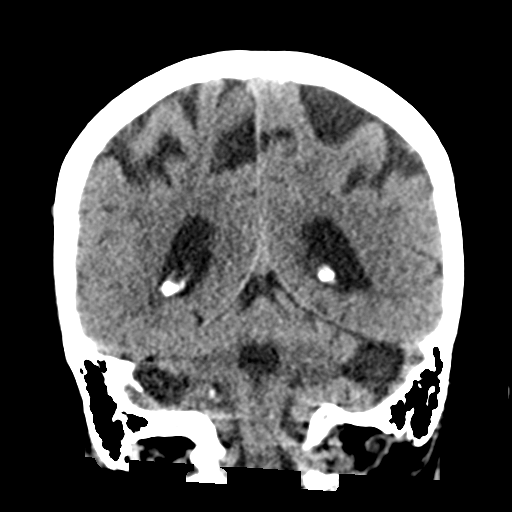
[im 31/69  brain]
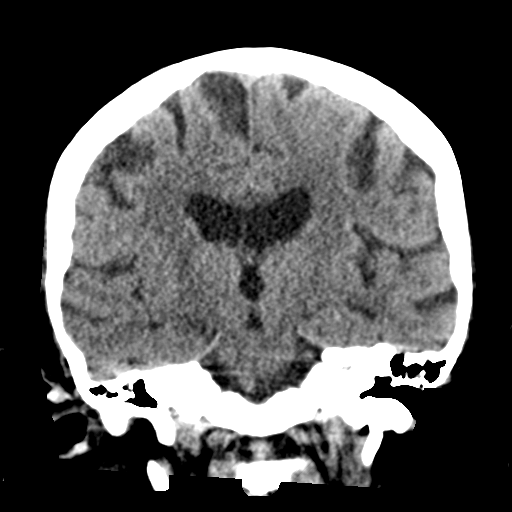
[im 38/69  brain]
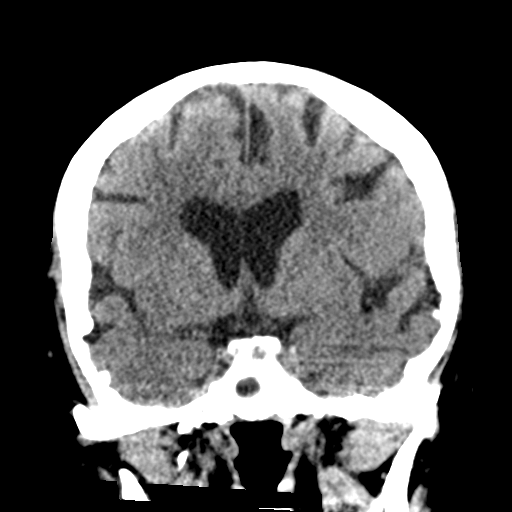

[Series 6: head 3.0 mpr sag · sagittal · 0.34mm/px · 3 of 56 slices shown]
[im 21/56  brain]
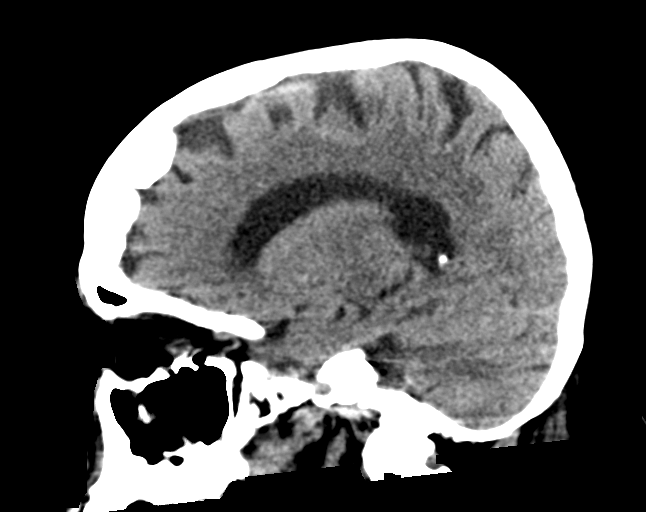
[im 28/56  brain]
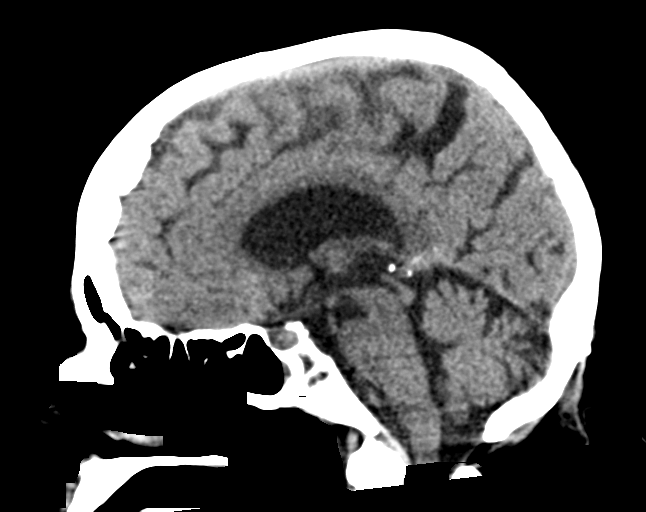
[im 36/56  brain]
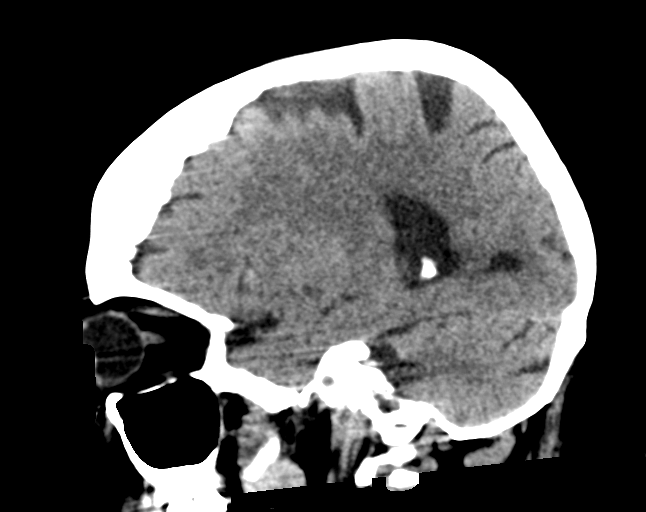

[15 of 47 positions shown; findings below may reference images not displayed]

FINDINGS: Brain: There is no evidence for acute hemorrhage, hydrocephalus,
mass lesion, or abnormal extra-axial fluid collection. No definite
CT evidence for acute infarction. Diffuse loss of parenchymal volume
is consistent with atrophy. Patchy low attenuation in the deep
hemispheric and periventricular white matter is nonspecific, but
likely reflects chronic microvascular ischemic demyelination.

Vascular: No hyperdense vessel or unexpected calcification.

Skull: No evidence for fracture. No worrisome lytic or sclerotic
lesion.

Sinuses/Orbits: Tiny air-fluid level in the right sphenoid sinus is
similar in the interval. Visualized portions of the globes and
intraorbital fat are unremarkable.

Other: None.
IMPRESSION: 1. No acute intracranial abnormality.
2. Atrophy with chronic small vessel white matter ischemic disease.
3. Stable tiny air-fluid level in the right sphenoid sinus. Acute
sinusitis would be a consideration.

## 2021-04-10 IMAGING — DX DG CHEST 1V PORT
1 series · 1 of 1 positions shown · non-contrast
Comparison: Chest x-ray [DATE].

CLINICAL DATA: 85-year-old female with history of shortness of
breath, weakness and fatigue. Elevated blood pressure.

EXAM:
PORTABLE CHEST 1 VIEW

[chest ap]
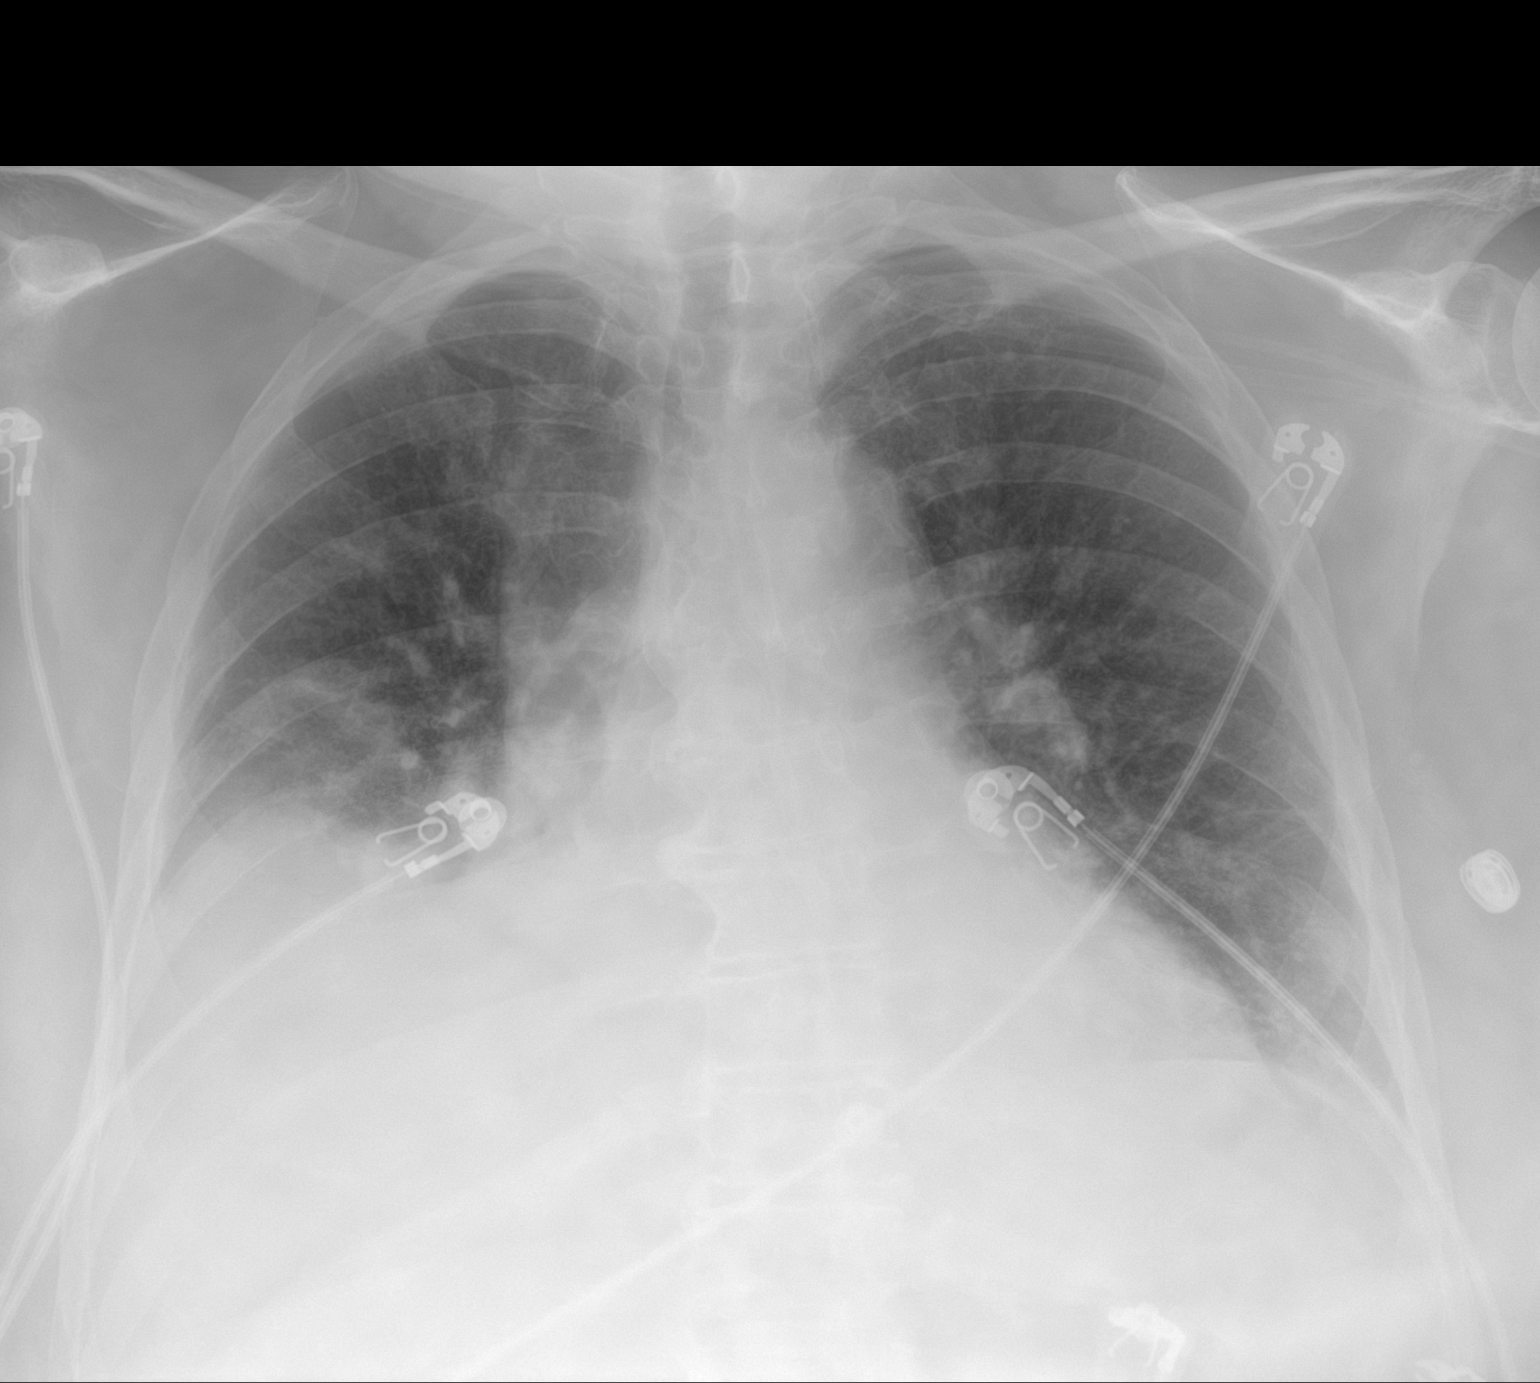

[1 of 1 positions shown; findings below may reference images not displayed]

FINDINGS: Chronic elevation of the right hemidiaphragm, similar to the prior
study. Bibasilar opacities (right greater than left) which may
reflect areas of atelectasis and/or consolidation. No definite
pleural effusions. No pneumothorax. No evidence of pulmonary edema.
Heart size appears borderline to mildly enlarged, likely accentuated
by low lung volumes and portable AP technique. Upper mediastinal
contours are within normal limits allowing for patient positioning.
IMPRESSION: 1. Persistent low lung volumes with chronic elevation of the right
hemidiaphragm and bibasilar opacities (right greater than left)
which may reflect areas of atelectasis and/or consolidation.

## 2021-04-10 MED ORDER — APIXABAN 5 MG PO TABS
5.0000 mg | ORAL_TABLET | Freq: Two times a day (BID) | ORAL | Status: DC
  Administered 2021-04-10 – 2021-04-19 (×19): 5 mg via ORAL
  Filled 2021-04-10 (×19): qty 1

## 2021-04-10 MED ORDER — AMLODIPINE BESYLATE 10 MG PO TABS
10.0000 mg | ORAL_TABLET | Freq: Every day | ORAL | Status: DC
  Administered 2021-04-10: 10 mg via ORAL
  Filled 2021-04-10: qty 2

## 2021-04-10 MED ORDER — HALOPERIDOL LACTATE 5 MG/ML IJ SOLN
2.0000 mg | Freq: Four times a day (QID) | INTRAMUSCULAR | Status: DC | PRN
  Administered 2021-04-10: 2 mg via INTRAVENOUS
  Filled 2021-04-10: qty 1

## 2021-04-10 MED ORDER — INSULIN ASPART 100 UNIT/ML IJ SOLN
0.0000 [IU] | INTRAMUSCULAR | Status: DC
  Administered 2021-04-10: 3 [IU] via SUBCUTANEOUS
  Administered 2021-04-10: 2 [IU] via SUBCUTANEOUS
  Administered 2021-04-10: 3 [IU] via SUBCUTANEOUS
  Administered 2021-04-10 – 2021-04-11 (×2): 2 [IU] via SUBCUTANEOUS
  Administered 2021-04-11: 8 [IU] via SUBCUTANEOUS
  Administered 2021-04-11 (×2): 2 [IU] via SUBCUTANEOUS
  Administered 2021-04-12: 3 [IU] via SUBCUTANEOUS
  Administered 2021-04-12: 2 [IU] via SUBCUTANEOUS
  Administered 2021-04-12: 3 [IU] via SUBCUTANEOUS

## 2021-04-10 MED ORDER — SODIUM CHLORIDE 0.9 % IV SOLN
2.0000 g | Freq: Once | INTRAVENOUS | Status: AC
  Administered 2021-04-10: 2 g via INTRAVENOUS
  Filled 2021-04-10: qty 2

## 2021-04-10 MED ORDER — HALOPERIDOL LACTATE 5 MG/ML IJ SOLN
5.0000 mg | Freq: Once | INTRAMUSCULAR | Status: AC
  Administered 2021-04-10: 5 mg via INTRAVENOUS
  Filled 2021-04-10: qty 1

## 2021-04-10 MED ORDER — BUDESONIDE 0.5 MG/2ML IN SUSP
2.0000 mg | Freq: Four times a day (QID) | RESPIRATORY_TRACT | Status: DC
  Administered 2021-04-10: 2 mg via RESPIRATORY_TRACT
  Administered 2021-04-11: 0.5 mg via RESPIRATORY_TRACT
  Administered 2021-04-11: 2 mg via RESPIRATORY_TRACT
  Filled 2021-04-10 (×5): qty 8

## 2021-04-10 MED ORDER — METHYLPREDNISOLONE SODIUM SUCC 40 MG IJ SOLR: 40.0000 mg | Freq: Two times a day (BID) | INTRAMUSCULAR | Status: DC

## 2021-04-10 MED ORDER — METOPROLOL TARTRATE 5 MG/5ML IV SOLN: 5.0000 mg | Freq: Once | INTRAVENOUS | Status: AC

## 2021-04-10 MED ORDER — PANTOPRAZOLE SODIUM 40 MG PO TBEC
40.0000 mg | DELAYED_RELEASE_TABLET | Freq: Every day | ORAL | Status: DC
  Administered 2021-04-12 – 2021-04-19 (×8): 40 mg via ORAL
  Filled 2021-04-10 (×8): qty 1

## 2021-04-10 MED ORDER — VANCOMYCIN HCL 2000 MG/400ML IV SOLN
2000.0000 mg | Freq: Once | INTRAVENOUS | Status: AC
  Administered 2021-04-10: 2000 mg via INTRAVENOUS
  Filled 2021-04-10: qty 400

## 2021-04-10 MED ORDER — DIVALPROEX SODIUM 250 MG PO DR TAB
500.0000 mg | DELAYED_RELEASE_TABLET | Freq: Two times a day (BID) | ORAL | Status: DC
  Administered 2021-04-10 – 2021-04-16 (×12): 500 mg via ORAL
  Filled 2021-04-10 (×3): qty 1
  Filled 2021-04-10: qty 2
  Filled 2021-04-10 (×3): qty 1
  Filled 2021-04-10 (×3): qty 2
  Filled 2021-04-10 (×3): qty 1

## 2021-04-10 MED ORDER — METOPROLOL TARTRATE 5 MG/5ML IV SOLN
INTRAVENOUS | Status: AC
  Administered 2021-04-10: 5 mg via INTRAVENOUS
  Filled 2021-04-10: qty 5

## 2021-04-10 MED ORDER — SODIUM CHLORIDE 0.9 % IV SOLN: INTRAVENOUS | Status: DC

## 2021-04-10 MED ORDER — DILTIAZEM HCL 25 MG/5ML IV SOLN
5.0000 mg | Freq: Once | INTRAVENOUS | Status: DC
  Filled 2021-04-10: qty 5

## 2021-04-10 MED ORDER — DEXMEDETOMIDINE HCL IN NACL 400 MCG/100ML IV SOLN
0.2000 ug/kg/h | INTRAVENOUS | Status: DC
  Administered 2021-04-10: 0.3 ug/kg/h via INTRAVENOUS
  Filled 2021-04-10: qty 100

## 2021-04-10 MED ORDER — CARVEDILOL 6.25 MG PO TABS
6.2500 mg | ORAL_TABLET | Freq: Two times a day (BID) | ORAL | Status: DC
  Administered 2021-04-10: 6.25 mg via ORAL
  Filled 2021-04-10: qty 1

## 2021-04-10 MED ORDER — ATORVASTATIN CALCIUM 10 MG PO TABS
10.0000 mg | ORAL_TABLET | Freq: Every day | ORAL | Status: DC
  Administered 2021-04-10 – 2021-04-19 (×10): 10 mg via ORAL
  Filled 2021-04-10 (×10): qty 1

## 2021-04-10 MED ORDER — NOREPINEPHRINE 4 MG/250ML-% IV SOLN
INTRAVENOUS | Status: AC
  Filled 2021-04-10: qty 250

## 2021-04-10 MED ORDER — TAMSULOSIN HCL 0.4 MG PO CAPS
0.4000 mg | ORAL_CAPSULE | ORAL | Status: DC
  Administered 2021-04-10 – 2021-04-18 (×6): 0.4 mg via ORAL
  Filled 2021-04-10 (×8): qty 1

## 2021-04-10 MED ORDER — HALOPERIDOL LACTATE 5 MG/ML IJ SOLN
2.0000 mg | Freq: Four times a day (QID) | INTRAMUSCULAR | Status: DC | PRN
  Administered 2021-04-11: 4 mg via INTRAVENOUS
  Administered 2021-04-11: 2 mg via INTRAVENOUS
  Administered 2021-04-12 – 2021-04-13 (×2): 4 mg via INTRAVENOUS
  Filled 2021-04-10 (×6): qty 1

## 2021-04-10 MED ORDER — PREDNISONE 20 MG PO TABS: 40.0000 mg | ORAL_TABLET | Freq: Every day | ORAL | Status: DC

## 2021-04-10 MED ORDER — IPRATROPIUM-ALBUTEROL 0.5-2.5 (3) MG/3ML IN SOLN
3.0000 mL | Freq: Four times a day (QID) | RESPIRATORY_TRACT | Status: DC
  Administered 2021-04-10 – 2021-04-11 (×3): 3 mL via RESPIRATORY_TRACT
  Filled 2021-04-10 (×6): qty 3

## 2021-04-10 MED ORDER — INSULIN ASPART 100 UNIT/ML IJ SOLN: 0.0000 [IU] | Freq: Three times a day (TID) | INTRAMUSCULAR | Status: DC

## 2021-04-10 MED ORDER — FLUTICASONE FUROATE-VILANTEROL 100-25 MCG/INH IN AEPB: 1.0000 | INHALATION_SPRAY | Freq: Every day | RESPIRATORY_TRACT | Status: DC

## 2021-04-10 NOTE — H&P (Signed)
NAME:  Becky SersJanina Coffin, MRN:  [id number], DOB:  08/11/1946, LOS: 0 ADMISSION DATE:  04/10/2021, CONSULTATION DATE:  8/29 REFERRING MD:  Chipper HerbZhang, CHIEF COMPLAINT:  acute on chronic respiratory failure    History of Present Illness:   This is a 75 year old female who was just discharged from the hospital on 8/26 after being admitted for Sepsis, septic shock, and acute hypoxic and hypercarbic respiratory failure requiring mechanical ventilation.  She is treated successfully but felt to have significant underlying element of OHS/OSA and was actually discharged on BiPAP at at bedtime with 2 to 3 L of supplemental oxygen during the daytime.  Presented to the emergency room from skilled nursing facility on 8/29 with altered mental status.  On EMS arrival found to be minimally responsive and tachypneic with respiratory rate in the mid 30s.  Initial capnography by EMS was 75.  Pulse oximetry 91% on arrival.  Arterial blood gas obtained showed pH of 7.31, PCO2 of 108, PO2 of 95, HCO3 54.4 she was awake and oriented reporting weakness.  She was placed on NIPPV, but because of her acute on chronic hypercarbia critical care was asked to evaluate   Pertinent  Medical History  Lives in nursing home Hypertension, diabetes, atrial fibrillation, obesity, heart failure preserved EF, seizure disorder, bipolar disease. Her MRSA infection Known RV dysfxn Just discharged after being admitted for acute on chronic hypercarbic and hypoxic respiratory failure Prior septic shock Significant Hospital Events: Including procedures, antibiotic start and stop dates in addition to other pertinent events   8/29 admitted w/ altered mental status and acute on chronic hypercarbia placed on NIPPV. Mental status improved PCCM consulted.   Interim History / Subjective:  Awake. Asking for mask to be removed.   Objective   Blood pressure (Abnormal) 152/79, pulse 93, temperature 99.1 F (37.3 C), temperature source Rectal, resp. rate  (Abnormal) 33, SpO2 100 %.       No intake or output data in the 24 hours ending 04/10/21 1129 There were no vitals filed for this visit.  Examination: General: Obese, very deconditioned and debilitated 75 year old female HENT: Normocephalic atraumatic no jugular venous distention Lungs: Diminished bilaterally Cardiovascular: Regular rate and rhythm Abdomen: Obese soft no organomegaly Extremities: Warm dry 4+ lower extremity edema bilateral foot drop Neuro: Awake oriented x2 very impulsive, demanding the BiPAP be removed.  Able to follow simple commands but very inattentive.  Upper extremities fairly good strength very weak lower extremities GU: Incontinent  Resolved Hospital Problem list     Assessment & Plan:  Acute on chronic hypercarbic respiratory failure, secondary to decompensated OHS/OSA (present on admission) Chronic elevated right hemidiaphragm, prior COVID and severe deconditioning.  - because of this she needs nocturnal NIPPV and without it this will keep happening.  -Her baseline CO2 calculated to around 87 -She does not use the BiPAP at the nursing home, states she does not want to use it "it is killing me" I discussed with her her options which are: Use the BiPAP, proceed to intubation, then trach with nocturnal ventilation, or lastly comfort care.  She indicates she would prefer comfort care, however with her current CO2 over 100 I do not think we can safely rely on her decision making ability Plan Admit to the intensive care Cycle on and off BiPAP IV Lasix Pulse oximetry Palliative care consult.  Her brother is planning on coming once again from [place] on 8/30.  He does recognize his sisters poor quality of life, he states he would not choose  this for himself but this is what she wants. Would like to try to avoid intubation if possible  Acute metabolic encephalopathy (present on admission) superimposed on underlying bipolar disease  Acutely complicated by  hypercarbia.  She is oriented x2, but impulsive, and uncooperative.  Refusing to utilize her BiPAP at this point Plan Supportive care Holding risperidone, but continue Depakote Serial neurochecks  Atrial fibrillation, anticoagulation/DOAC Plan Telemetry Continue Coreg and apixaban  History of HFpEF, and hypertension Plan  continue Norvasc IV Lasix  Mild Leukocytosis She is not infected Plan No abx   History of diabetes type 2 Plan Sliding scale insulin  History of seizure disorder Plan Depakote  Sacral decubitus ulcer stage I (present on admission) Plan Wound ostomy consult  Severe physical deconditioning status post multiple and prolonged/recurrent illnesses Plan Supportive care  Medical noncompliance.  Almost certainly somewhat exacerbated by her underlying psychological disease Plan Supportive care Need to discuss goals of care further with her brother  Urinary incontinence Plan Skin care  Best Practice (right click and "Reselect all SmartList Selections" daily)   Diet/type: NPO w/ oral meds DVT prophylaxis: DOAC GI prophylaxis: PPI Lines: N/A Foley:  N/A Code Status:  full code Last date of multidisciplinary goals of care discussion [is pending.  She did have a MOST form, requesting full code.  I called her brother [name] he lives up in [place].  He tells me she is not able to make decisions for herself.  But he also reports that she wants all of the care we have been providing often telling him "thank you for helping me".  He does verbalize that her quality of life is poor, and he would not choose this for himself but he is convinced this is what she wants.  Of note I told her her options really are 3: #1 use BiPAP, #2 intubate, and eventually proceed to tracheostomy and nocturnal ventilation or #3, DO NOT RESUSCITATE, she indicated she would rather not live than use #1 or 2 however her was over 100 so I do not think we can rely on this to make decisions.   Her brother is on the way to help further with goals of care on 8/30  Labs   CBC: Recent Labs  Lab 04/04/21 0553 04/05/21 0323 04/06/21 0428 04/07/21 0452 04/10/21 1017 04/10/21 1107  WBC 10.0 20.2* 15.5* 14.7* 19.1*  --   NEUTROABS 6.2  --  11.4* 8.7* 13.4*  --   HGB 10.6* 10.3* 10.1* 10.5* 11.9* 12.6  HCT 35.5* 33.8* 33.5* 35.2* 39.7 37.0  MCV 93.2 93.1 93.8 93.9 95.7  --   PLT 210 209 196 196 190  --     Basic Metabolic Panel: Recent Labs  Lab 04/03/21 1333 04/04/21 0553 04/05/21 0323 04/06/21 0428 04/07/21 0452 04/10/21 1107  NA  --  135 135 141 139 138  K  --  4.0 3.6 3.6 3.9 3.8  CL  --  94* 95* 94* 93*  --   CO2  --  34* 35* 38* 41*  --   GLUCOSE  --  193* 179* 226* 151*  --   BUN  --  23 26* 25* 23  --   CREATININE  --  0.71 0.66 0.67 0.56  --   CALCIUM  --  8.9 8.5* 8.4* 8.7*  --   MG 2.2 2.0  --  1.9 1.8  --   PHOS 2.4* 3.1 2.4* 3.1  --   --    GFR: Estimated Creatinine  Clearance: 77.8 mL/min (by C-G formula based on SCr of 0.56 mg/dL). Recent Labs  Lab 04/03/21 1333 04/04/21 0553 04/04/21 0553 04/05/21 0323 04/06/21 0428 04/07/21 0452 04/10/21 1017  PROCALCITON  --  <0.10  --  <0.10  --   --   --   WBC  --  10.0   < > 20.2* 15.5* 14.7* 19.1*  LATICACIDVEN 2.8* 1.4  --   --   --   --  1.2   < > = values in this interval not displayed.    Liver Function Tests: Recent Labs  Lab 04/05/21 0323  AST 9*  ALT 8  ALKPHOS 27*  BILITOT 0.4  PROT 5.0*  ALBUMIN 2.3*   No results for input(s): LIPASE, AMYLASE in the last 168 hours. No results for input(s): AMMONIA in the last 168 hours.  ABG    Component Value Date/Time   PHART 7.312 (L) 04/10/2021 1107   PCO2ART 107.9 (HH) 04/10/2021 1107   PO2ART 95 04/10/2021 1107   HCO3 54.4 (H) 04/10/2021 1107   TCO2 >50 (H) 04/10/2021 1107   O2SAT 95.0 04/10/2021 1107     Coagulation Profile: Recent Labs  Lab 04/10/21 1017  INR 1.1    Cardiac Enzymes: No results for input(s): CKTOTAL, CKMB,  CKMBINDEX, TROPONINI in the last 168 hours.  HbA1C: Hgb A1c MFr Bld  Date/Time Value Ref Range Status  03/30/2021 03:11 AM 7.0 (H) 4.8 - 5.6 % Final    Comment:    (NOTE) Pre diabetes:          5.7%-6.4%  Diabetes:              >6.4%  Glycemic control for   <7.0% adults with diabetes   11/18/2020 01:54 AM 6.1 (H) 4.8 - 5.6 % Final    Comment:    (NOTE) Pre diabetes:          5.7%-6.4%  Diabetes:              >6.4%  Glycemic control for   <7.0% adults with diabetes     CBG: Recent Labs  Lab 04/06/21 0735 04/06/21 1149 04/06/21 1614 04/07/21 0657 04/07/21 1110  GLUCAP 142* 188* 244* 139* 213*    Review of Systems:   Not able   Past Medical History:  She,  has a past medical history of Atrial fibrillation (HCC), Bipolar disorder (HCC), Bipolar disorder (HCC) (04/27/2020), Clostridium difficile diarrhea (08/02/2019), Dehydration, Essential hypertension, Fall (02/11/2020), Morbid obesity (HCC), Osteomyelitis (HCC) (12/03/2019), Pneumonia due to COVID-19 virus (09/15/2019), Type 2 diabetes mellitus (HCC), and Weakness.   Surgical History:   Past Surgical History:  Procedure Laterality Date   INCISION AND DRAINAGE PERIRECTAL ABSCESS Left 12/04/2019   Procedure: IRRIGATION AND DEBRIDEMENT LEFT BUTTOCK ABSCESS;  Surgeon: Violeta Gelinas, MD;  Location: [facility] OR;  Service: General;  Laterality: Left;   INCISION AND DRAINAGE PERIRECTAL ABSCESS Left 12/10/2019   Procedure: REPEAT IRRIGATION AND DEBRIDEMENT BUTTOCK  ABSCESS;  Surgeon: Abigail Miyamoto, MD;  Location: [facility] OR;  Service: General;  Laterality: Left;   IR FLUORO GUIDE CV LINE RIGHT  11/25/2020   IR RADIOLOGIST EVAL & MGMT  12/29/2020   IR US GUIDE VASC ACCESS RIGHT  11/25/2020     Social History:   reports that she has never smoked. She has never used smokeless tobacco. She reports that she does not currently use alcohol. She reports that she does not currently use drugs.   Family History:  Her Family history is unknown  by  patient.   Allergies Allergies  Allergen Reactions   Chlorhexidine Gluconate Itching   Vancomycin     Vancomycin infusion related reaction- May need Vanc to be given at a slower rate    Acetazolamide Er Rash     Home Medications  Prior to Admission medications   Medication Sig Start Date End Date Taking? Authorizing Provider  amLODipine (NORVASC) 10 MG tablet Take 1 tablet (10 mg total) by mouth daily. 12/03/20   Kathlen Mody, MD  apixaban (ELIQUIS) 5 MG TABS tablet Take 5 mg by mouth 2 (two) times daily.    [provider]  atorvastatin (LIPITOR) 10 MG tablet Take 10 mg by mouth daily.    [provider]  calcium carbonate (TUMS - DOSED IN MG ELEMENTAL CALCIUM) 500 MG chewable tablet Chew 500 mg by mouth daily.    [provider]  carvedilol (COREG) 6.25 MG tablet Take 1 tablet (6.25 mg total) by mouth 2 (two) times daily with a meal. 12/02/20   Kathlen Mody, MD  Cholecalciferol (VITAMIN D) 125 MCG (5000 UT) CAPS Take 10,000 Units by mouth once a week. Wednesday    [provider]  diphenhydrAMINE (BENADRYL) 2 % cream Apply 1 application topically 3 (three) times daily as needed for itching.    [provider]  divalproex (DEPAKOTE) 500 MG DR tablet Take 500 mg by mouth 2 (two) times daily.    [provider]  docusate sodium (COLACE) 100 MG capsule Take 1 capsule (100 mg total) by mouth 2 (two) times daily as needed for mild constipation. 01/05/20   Regalado, Belkys A, MD  ferrous sulfate 324 MG TBEC Take 324 mg by mouth daily.    [provider]  insulin aspart (NOVOLOG) 100 UNIT/ML injection CBG 70 - 120: 0 units CBG 121 - 150: 2 units CBG 151 - 200: 3 units CBG 201 - 250: 5 units CBG 251 - 300: 8 units CBG 301 - 350: 11 units CBG 351 - 400: 15 units 12/02/20   Kathlen Mody, MD  Ipratropium-Albuterol (COMBIVENT RESPIMAT) 20-100 MCG/ACT AERS respimat Inhale 1 puff into the lungs every 12 (twelve) hours.    [provider]  JANUVIA 50 MG tablet Take 50 mg by mouth daily. 03/27/21   [provider]  Multiple Vitamin (MULTIVITAMIN WITH MINERALS) TABS tablet Take 1 tablet by mouth daily.    [provider]  pantoprazole (PROTONIX) 40 MG tablet Take 1 tablet (40 mg total) by mouth daily. 04/07/21   Glade Lloyd, MD  polyethylene glycol (MIRALAX / GLYCOLAX) 17 g packet Take 17 g by mouth daily. 04/07/21   Glade Lloyd, MD  predniSONE (DELTASONE) 20 MG tablet Take 1 tablet (20 mg total) by mouth daily with breakfast. 04/07/21   Glade Lloyd, MD  risperiDONE (RISPERDAL) 2 MG tablet Take 0.5 tablets (1 mg total) by mouth 2 (two) times daily. 04/07/21   Glade Lloyd, MD  sennosides-docusate sodium (SENOKOT-S) 8.6-50 MG tablet Take 2 tablets by mouth daily.    [provider]  tamsulosin (FLOMAX) 0.4 MG CAPS capsule Take 1 capsule (0.4 mg total) by mouth daily. Patient taking differently: Take 0.4 mg by mouth every other day. 12/03/20   Kathlen Mody, MD     Critical care time: 32 min      Simonne Martinet ACNP-BC [facility] Pager # [phone] OR # [phone] if no answer

## 2021-04-10 NOTE — Progress Notes (Signed)
Patient agitated and noncompliant in wearing BIPAP, repeatedly pulling at mask, yelling at RN to take it off. BIPAP removed, placed on 4L [place].

## 2021-04-10 NOTE — ED Notes (Signed)
RN and NT cleaned pt of urine and feces. Placed purewyck. Turned pt on her side. Pt has stage 1 pressure ulcer on sacrum

## 2021-04-10 NOTE — ED Notes (Signed)
Pt refusing all nebulizers. She believes they will make her "die." This RN explained to her the importance of the medications. Pt still refuses.

## 2021-04-10 NOTE — H&P (Signed)
History and Physical    Becky Hendricks YWV:[id number] DOB: 1946/07/07 DOA: 04/10/2021  PCP: Roderic Scarce, MD (Confirm with patient/family/NH records and if not entered, this has to be entered at [facility] point of entry) Patient coming from: Assisted living  I have personally briefly reviewed patient's old medical records in [facility]  Chief Complaint: Let me go home  HPI: Becky Hendricks is a 75 y.o. 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  Patient was recently hospitalized for an hypoxic respite failure, was intubated extubated and treated for and then discharged to assisted living.  Patient does not remember what has happened. She reported that " feeling sick for last 3 days".  But denied any breathing symptoms, no cough no chest pains.  Asked about whether she wears patient became agitated and denied that she is supposed to be wearing BIPAP at all, " when I wear the BIPAP, I am sicker"  ED Course: ABG 7.31/107/95, chest xray no significant changes compared to chest x-ray done 1 week ago.  WBC 19.1.  CT head negative for acute findings.  Pain  Review of Systems: As per HPI otherwise 14 point review of systems negative.    Past Medical History:  Diagnosis Date   Atrial fibrillation (HCC)    Bipolar disorder (HCC)    Bipolar disorder (HCC) 04/27/2020   Clostridium difficile diarrhea 08/02/2019   Dehydration    Essential hypertension    Fall 02/11/2020   Morbid obesity (HCC)    Osteomyelitis (HCC) 12/03/2019   Pneumonia due to COVID-19 virus 09/15/2019   Type 2 diabetes mellitus (HCC)    Weakness     Past Surgical History:  Procedure Laterality Date   INCISION AND DRAINAGE PERIRECTAL ABSCESS Left 12/04/2019   Procedure: IRRIGATION AND DEBRIDEMENT LEFT BUTTOCK ABSCESS;  Surgeon: Violeta Gelinas, MD;   Location: [facility] OR;  Service: General;  Laterality: Left;   INCISION AND DRAINAGE PERIRECTAL ABSCESS Left 12/10/2019   Procedure: REPEAT IRRIGATION AND DEBRIDEMENT BUTTOCK  ABSCESS;  Surgeon: Abigail Miyamoto, MD;  Location: [facility] OR;  Service: General;  Laterality: Left;   IR FLUORO GUIDE CV LINE RIGHT  11/25/2020   IR RADIOLOGIST EVAL & MGMT  12/29/2020   IR US GUIDE VASC ACCESS RIGHT  11/25/2020     reports that she has never smoked. She has never used smokeless tobacco. She reports that she does not currently use alcohol. She reports that she does not currently use drugs.  Allergies  Allergen Reactions   Chlorhexidine Gluconate Itching   Vancomycin     Vancomycin infusion related reaction- May need Vanc to be given at a slower rate    Acetazolamide Er Rash    Family History  Family history unknown: Yes     Prior to Admission medications   Medication Sig Start Date End Date Taking? Authorizing Provider  amLODipine (NORVASC) 10 MG tablet Take 1 tablet (10 mg total) by mouth daily. 12/03/20  Yes Kathlen Mody, MD  apixaban (ELIQUIS) 5 MG TABS tablet Take 5 mg by mouth 2 (two) times daily.   Yes [provider]  atorvastatin (LIPITOR) 10 MG tablet Take 10 mg by mouth daily.   Yes [provider]  carvedilol (COREG) 6.25 MG tablet Take 1 tablet (6.25 mg total) by mouth 2 (two) times daily with a meal. 12/02/20  Yes Kathlen Mody, MD  Cholecalciferol (VITAMIN D) 125 MCG (5000 UT) CAPS Take 10,000 Units by mouth once a week. Wednesday   Yes [provider]  divalproex (DEPAKOTE) 500 MG DR tablet Take 500 mg by mouth 2 (two) times daily.   Yes [provider]  ferrous sulfate 324 MG TBEC Take 324 mg by mouth daily.   Yes [provider]  calcium carbonate (TUMS - DOSED IN MG ELEMENTAL CALCIUM) 500 MG chewable tablet Chew 500 mg by mouth daily.    [provider]  diphenhydrAMINE (BENADRYL) 2 % cream Apply 1 application topically 3 (three) times  daily as needed for itching.    [provider]  docusate sodium (COLACE) 100 MG capsule Take 1 capsule (100 mg total) by mouth 2 (two) times daily as needed for mild constipation. 01/05/20   Regalado, Belkys A, MD  insulin aspart (NOVOLOG) 100 UNIT/ML injection CBG 70 - 120: 0 units CBG 121 - 150: 2 units CBG 151 - 200: 3 units CBG 201 - 250: 5 units CBG 251 - 300: 8 units CBG 301 - 350: 11 units CBG 351 - 400: 15 units 12/02/20   Kathlen Mody, MD  Ipratropium-Albuterol (COMBIVENT RESPIMAT) 20-100 MCG/ACT AERS respimat Inhale 1 puff into the lungs every 12 (twelve) hours.    [provider]  JANUVIA 50 MG tablet Take 50 mg by mouth daily. 03/27/21   [provider]  Multiple Vitamin (MULTIVITAMIN WITH MINERALS) TABS tablet Take 1 tablet by mouth daily.    [provider]  pantoprazole (PROTONIX) 40 MG tablet Take 1 tablet (40 mg total) by mouth daily. 04/07/21   Glade Lloyd, MD  polyethylene glycol (MIRALAX / GLYCOLAX) 17 g packet Take 17 g by mouth daily. 04/07/21   Glade Lloyd, MD  predniSONE (DELTASONE) 20 MG tablet Take 1 tablet (20 mg total) by mouth daily with breakfast. 04/07/21   Glade Lloyd, MD  risperiDONE (RISPERDAL) 2 MG tablet Take 0.5 tablets (1 mg total) by mouth 2 (two) times daily. 04/07/21   Glade Lloyd, MD  sennosides-docusate sodium (SENOKOT-S) 8.6-50 MG tablet Take 2 tablets by mouth daily.    [provider]  tamsulosin (FLOMAX) 0.4 MG CAPS capsule Take 1 capsule (0.4 mg total) by mouth daily. Patient taking differently: Take 0.4 mg by mouth every other day. 12/03/20   Kathlen Mody, MD    Physical Exam: Vitals:   04/10/21 1030 04/10/21 1043 04/10/21 1045 04/10/21 1215  BP: 100/83  (!) 152/79 (!) 152/83  Pulse: 92  93 86  Resp: (!) 31  (!) 33 15  Temp:  99.1 F (37.3 C)    TempSrc:  Rectal    SpO2: 100%  100% 97%    Constitutional: NAD, calm, comfortable Vitals:   04/10/21 1030 04/10/21 1043 04/10/21 1045  04/10/21 1215  BP: 100/83  (!) 152/79 (!) 152/83  Pulse: 92  93 86  Resp: (!) 31  (!) 33 15  Temp:  99.1 F (37.3 C)    TempSrc:  Rectal    SpO2: 100%  100% 97%   Eyes: PERRL, lids and conjunctivae normal ENMT: Mucous membranes are moist. Posterior pharynx clear of any exudate or lesions.Normal dentition.  Neck: normal, supple, no masses, no thyromegaly Respiratory: clear to auscultation bilaterally, no wheezing, no crackles. Normal respiratory effort. No accessory muscle use.  Cardiovascular: Regular rate and rhythm, no murmurs / rubs / gallops. No extremity edema. 2+ pedal pulses. No carotid bruits.  Abdomen: no tenderness, no masses palpated. No hepatosplenomegaly. Bowel  sounds positive.  Musculoskeletal: no clubbing / cyanosis. No joint deformity upper and lower extremities. Good ROM, no contractures. Normal muscle tone.  Skin: no rashes, lesions, ulcers. No induration Neurologic: CN 2-12 grossly intact. Sensation intact, DTR normal. Strength 5/5 in all 4.  Psychiatric: Normal judgment and insight. Alert and oriented x 3. Normal mood.     Labs on Admission: I have personally reviewed following labs and imaging studies  CBC: Recent Labs  Lab 04/04/21 0553 04/05/21 0323 04/06/21 0428 04/07/21 0452 04/10/21 1017 04/10/21 1107  WBC 10.0 20.2* 15.5* 14.7* 19.1*  --   NEUTROABS 6.2  --  11.4* 8.7* 13.4*  --   HGB 10.6* 10.3* 10.1* 10.5* 11.9* 12.6  HCT 35.5* 33.8* 33.5* 35.2* 39.7 37.0  MCV 93.2 93.1 93.8 93.9 95.7  --   PLT 210 209 196 196 190  --    Basic Metabolic Panel: Recent Labs  Lab 04/03/21 1333 04/04/21 0553 04/04/21 0553 04/05/21 0323 04/06/21 0428 04/07/21 0452 04/10/21 1017 04/10/21 1107  NA  --  135   < > 135 141 139 138 138  K  --  4.0   < > 3.6 3.6 3.9 4.3 3.8  CL  --  94*  --  95* 94* 93* 87*  --   CO2  --  34*  --  35* 38* 41* 47*  --   GLUCOSE  --  193*  --  179* 226* 151* 186*  --   BUN  --  23  --  26* 25* 23 18  --   CREATININE  --  0.71   --  0.66 0.67 0.56 0.44  --   CALCIUM  --  8.9  --  8.5* 8.4* 8.7* 8.7*  --   MG 2.2 2.0  --   --  1.9 1.8  --   --   PHOS 2.4* 3.1  --  2.4* 3.1  --   --   --    < > = values in this interval not displayed.   GFR: Estimated Creatinine Clearance: 77.8 mL/min (by C-G formula based on SCr of 0.44 mg/dL). Liver Function Tests: Recent Labs  Lab 04/05/21 0323 04/10/21 1017  AST 9* 12*  ALT 8 8  ALKPHOS 27* 35*  BILITOT 0.4 0.7  PROT 5.0* 5.4*  ALBUMIN 2.3* 2.5*   No results for input(s): LIPASE, AMYLASE in the last 168 hours. No results for input(s): AMMONIA in the last 168 hours. Coagulation Profile: Recent Labs  Lab 04/10/21 1017  INR 1.1   Cardiac Enzymes: No results for input(s): CKTOTAL, CKMB, CKMBINDEX, TROPONINI in the last 168 hours. BNP (last 3 results) No results for input(s): PROBNP in the last 8760 hours. HbA1C: No results for input(s): HGBA1C in the last 72 hours. CBG: Recent Labs  Lab 04/06/21 0735 04/06/21 1149 04/06/21 1614 04/07/21 0657 04/07/21 1110  GLUCAP 142* 188* 244* 139* 213*   Lipid Profile: No results for input(s): CHOL, HDL, LDLCALC, TRIG, CHOLHDL, LDLDIRECT in the last 72 hours. Thyroid Function Tests: No results for input(s): TSH, T4TOTAL, FREET4, T3FREE, THYROIDAB in the last 72 hours. Anemia Panel: No results for input(s): VITAMINB12, FOLATE, FERRITIN, TIBC, IRON, RETICCTPCT in the last 72 hours. Urine analysis:    Component Value Date/Time   COLORURINE YELLOW 03/29/2021 2000   APPEARANCEUR CLOUDY (A) 03/29/2021 2000   LABSPEC 1.034 (H) 03/29/2021 2000   PHURINE 6.0 03/29/2021 2000   GLUCOSEU NEGATIVE 03/29/2021 2000   HGBUR NEGATIVE 03/29/2021 2000   BILIRUBINUR  NEGATIVE 03/29/2021 2000   KETONESUR NEGATIVE 03/29/2021 2000   PROTEINUR NEGATIVE 03/29/2021 2000   NITRITE POSITIVE (A) 03/29/2021 2000   LEUKOCYTESUR LARGE (A) 03/29/2021 2000    Radiological Exams on Admission: CT Head Wo Contrast  Result Date:  04/10/2021 CLINICAL DATA:  Altered mental status.  Unable to move right arm. EXAM: CT HEAD WITHOUT CONTRAST TECHNIQUE: Contiguous axial images were obtained from the base of the skull through the vertex without intravenous contrast. COMPARISON:  04/02/2021 FINDINGS: Brain: There is no evidence for acute hemorrhage, hydrocephalus, mass lesion, or abnormal extra-axial fluid collection. No definite CT evidence for acute infarction. Diffuse loss of parenchymal volume is consistent with atrophy. Patchy low attenuation in the deep hemispheric and periventricular white matter is nonspecific, but likely reflects chronic microvascular ischemic demyelination. Vascular: No hyperdense vessel or unexpected calcification. Skull: No evidence for fracture. No worrisome lytic or sclerotic lesion. Sinuses/Orbits: Tiny air-fluid level in the right sphenoid sinus is similar in the interval. Visualized portions of the globes and intraorbital fat are unremarkable. Other: None. IMPRESSION: 1. No acute intracranial abnormality. 2. Atrophy with chronic small vessel white matter ischemic disease. 3. Stable tiny air-fluid level in the right sphenoid sinus. Acute sinusitis would be a consideration. Electronically Signed   By: Kennith Center M.D.   On: 04/10/2021 12:03   DG Chest Port 1 View  Result Date: 04/10/2021 CLINICAL DATA:  75 year old female with history of shortness of breath, weakness and fatigue. Elevated blood pressure. EXAM: PORTABLE CHEST 1 VIEW COMPARISON:  Chest x-ray 04/01/2021. FINDINGS: Chronic elevation of the right hemidiaphragm, similar to the prior study. Bibasilar opacities (right greater than left) which may reflect areas of atelectasis and/or consolidation. No definite pleural effusions. No pneumothorax. No evidence of pulmonary edema. Heart size appears borderline to mildly enlarged, likely accentuated by low lung volumes and portable AP technique. Upper mediastinal contours are within normal limits allowing for  patient positioning. IMPRESSION: 1. Persistent low lung volumes with chronic elevation of the right hemidiaphragm and bibasilar opacities (right greater than left) which may reflect areas of atelectasis and/or consolidation. Electronically Signed   By: Trudie Reed M.D.   On: 04/10/2021 11:32    EKG: Independently reviewed.  Sinus, chronic ST-T changes.  Assessment/Plan Active Problems:   Hypoxia  (please populate well all problems here in Problem List. (For example, if patient is on BP meds at home and you resume or decide to hold them, it is a problem that needs to be her. Same for CAD, COPD, HLD and so on)  Acute metabolic encephalopathy -Secondary to acute hypercapnic respiratory failure/respiratory acidosis. Strongly suspect OSA and morbid obesity/being the underlying etiology for her problems.  Clearly, patient has not been compliant with BiPAP at home. I tried to convince the patient she has to wear BiPAP otherwise will need intubation, but patient claimed that she does not want either of them.  And patient refused to consider change her CODE STATUS to DNR. -D/W ICU team, will continue current BIPAP and admit patient to ICU for close monitoring, [facility] hospitalist will be primary. -Repeat VBG in 2 hours, -Patient is allergic to Diamox. -Completed 7 days of broad-spectrum antibiotics last week, her symptoms and signs compatible with acute on chronic CO2 retention rather than another episode of pneumonia, will monitor off antibiotics. -Breathing treatment, switch p.o. steroid to IV for 1 to 2 days then back to p.o. steroid, add breathing treatment. -Continue risperidone BID, avoid sedating medications.  Acute on chronic hypercapnic respiratory failure/respiratory acidosis -  As above.  IDDM with hyperglycemia -Sliding scale -Januvia  HTN -Amlodipine and Coreg.  PAF -In sinus rhythm, CT had a reassuring, continue Eliquis.  Morbid obesity -Calorie control  DVT prophylaxis:  Eliquis Code Status: Full code Family Communication: Brother over phone, who will drive in from [place] tomorrow, as per brother, there is no other family members. Disposition Plan: Sick, will need more than 2 midnight hospital stay Consults called: PCCM Admission status: ICU   Emeline General MD [facility] Pager [phone]  04/10/2021, 12:51 PM

## 2021-04-10 NOTE — Progress Notes (Signed)
Afternoon rounds.  Stop by to reassess patient.  She has been refusing all therapy, this includes nebulized medications, IV medications, BiPAP, oxygen.  At each point nursing has had to explain each intervention is absolutely critical where she could potentially die and at that point she will reluctantly agree.  She continues to refuse BiPAP.  I once again tried to engage her about goals of care specifically to talk about the use of BiPAP, versus need for potential mechanical ventilation.  She tells me she does not want to be on a ventilator, she does seem to realize that eventually she will die of her illness however she refuses to talk about specifics.  I am concerned because inevitably this is going to end up with her being intubated and mechanically ventilated if not during this stay, perhaps another.  She continued to be quite agitated and uncooperative, continuing to even remove her oxygen from time to time.  At this point we will place her on some low-dose Precedex so that she stops interrupting her care.  The goal will not to be to sedate her but simply to hopefully facilitate cooperation. I strongly think she should be DO NOT RESUSCITATE and DO NOT INTUBATE in the future, if she does not want mechanical ventilation and she will participate or cooperate with BiPAP we really have nothing else to offer her.  Simonne Martinet ACNP-BC [facility] Pulmonary/Critical Care Pager # [phone] OR # [phone] if no answer

## 2021-04-10 NOTE — Progress Notes (Signed)
Patient ID: Becky Hendricks, female   DOB: Jun 15, 1946, 75 y.o.   MRN: [id number]       Thank you for this consult. Chart reviewed.  Spoke with patient's brother [name] in efforts to arrange GOC meeting. [name] is traveling from and plans to arrive at bedside tomorrow around 8am. He wants to be able to speak with the ICU team while they are rounding.  I told [name] the earliest I would be able to meet with him is 10:30 am. He is agreeable.    Sherlean Foot, NP-C Palliative Medicine   Please call Palliative Medicine team phone with any questions [phone]. For individual providers please see AMION.   No charge

## 2021-04-10 NOTE — ED Provider Notes (Signed)
[facility] EMERGENCY DEPARTMENT Provider Note   CSN: [id number] Arrival date & time: 04/10/21  1001     History Chief Complaint  Patient presents with   Altered Mental Status    Becky Hendricks is a 75 y.o. female.  75 year old female with prior medical history as detailed below presents by EMS.  Patient apparently called for help at her facility.  EMS was then notified.  Patient apparently complained of weakness.  Patient also appeared more confused and at baseline.  Patient was also mildly hypotensive and mildly tachypneic.  Patient is reportedly on 3 L nasal cannula at all times.  EMS reports sats were at approximately 91% initially.  O2 increased to 5 L nasal cannula by EMS.  Sats improved to 98%.    Patient is comfortable on arrival.  She complains of feeling "weak."  She is alert and conversational.  The history is provided by the patient, the EMS personnel and medical records.  Altered Mental Status Presenting symptoms: lethargy   Severity:  Unable to specify Most recent episode:  Today Episode history:  Unable to specify Duration:  1 hour Timing:  Unable to specify Progression:  Unable to specify Chronicity:  New     Past Medical History:  Diagnosis Date   Atrial fibrillation (HCC)    Bipolar disorder (HCC)    Bipolar disorder (HCC) 04/27/2020   Clostridium difficile diarrhea 08/02/2019   Dehydration    Essential hypertension    Fall 02/11/2020   Morbid obesity (HCC)    Osteomyelitis (HCC) 12/03/2019   Pneumonia due to COVID-19 virus 09/15/2019   Type 2 diabetes mellitus (HCC)    Weakness     Patient Active Problem List   Diagnosis Date Noted   Acute on chronic respiratory failure with hypoxia and hypercapnia (HCC)    Acute respiratory failure with hypoxia (HCC) 03/29/2021   Hyponatremia 03/29/2021   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   Pneumonia of lower lobe due to infectious organism 03/29/2021   Colitis 03/29/2021   Acute  respiratory failure (HCC) 03/29/2021   Bipolar affective disorder, currently manic, mild (HCC) 11/19/2020   Constipation 11/18/2020   Encephalopathy 11/18/2020   MRSA bacteremia    SIRS (systemic inflammatory response syndrome) (HCC) 11/17/2020   Nausea and vomiting 11/17/2020   Mixed diabetic hyperlipidemia associated with type 2 diabetes mellitus (HCC) 11/17/2020   Type 2 diabetes mellitus with hyperglycemia, with long-term current use of insulin (HCC) 11/17/2020   Immunization due 07/26/2020   Psychosis (HCC)    Bipolar disorder (HCC) 04/27/2020   Fall 02/11/2020   Chronic diastolic CHF (congestive heart failure) (HCC) 01/22/2020   Drug rash    Acute osteomyelitis of pelvic region and thigh (HCC)    Palliative care by specialist    Goals of care, counseling/discussion    Edema of upper extremity    Hyperphosphatemia    Cardiac arrest (HCC)    Sacral wound    Hypoxia    Severe sepsis (HCC) 12/03/2019   Abscess of multiple sites of buttock 12/03/2019   AF (paroxysmal atrial fibrillation) (HCC) 12/03/2019   Metabolic encephalopathy 09/15/2019   Urinary tract infection without hematuria 09/15/2019   Pressure injury of skin 09/10/2019   Morbid obesity with BMI of 40.0-44.9, adult (HCC) 02/27/2018    Past Surgical History:  Procedure Laterality Date   INCISION AND DRAINAGE PERIRECTAL ABSCESS Left 12/04/2019   Procedure: IRRIGATION AND DEBRIDEMENT LEFT BUTTOCK ABSCESS;  Surgeon: Violeta Gelinas, MD;  Location: [facility] OR;  Service: General;  Laterality: Left;   INCISION AND DRAINAGE PERIRECTAL ABSCESS Left 12/10/2019   Procedure: REPEAT IRRIGATION AND DEBRIDEMENT BUTTOCK  ABSCESS;  Surgeon: Abigail Miyamoto, MD;  Location: [facility] OR;  Service: General;  Laterality: Left;   IR FLUORO GUIDE CV LINE RIGHT  11/25/2020   IR RADIOLOGIST EVAL & MGMT  12/29/2020   IR US GUIDE VASC ACCESS RIGHT  11/25/2020     OB History   No obstetric history on file.     Family History  Family history  unknown: Yes    Social History   Tobacco Use   Smoking status: Never   Smokeless tobacco: Never  Vaping Use   Vaping Use: Unknown  Substance Use Topics   Alcohol use: Not Currently   Drug use: Not Currently    Home Medications Prior to Admission medications   Medication Sig Start Date End Date Taking? Authorizing Provider  amLODipine (NORVASC) 10 MG tablet Take 1 tablet (10 mg total) by mouth daily. 12/03/20   Kathlen Mody, MD  apixaban (ELIQUIS) 5 MG TABS tablet Take 5 mg by mouth 2 (two) times daily.    [provider]  atorvastatin (LIPITOR) 10 MG tablet Take 10 mg by mouth daily.    [provider]  calcium carbonate (TUMS - DOSED IN MG ELEMENTAL CALCIUM) 500 MG chewable tablet Chew 500 mg by mouth daily.    [provider]  carvedilol (COREG) 6.25 MG tablet Take 1 tablet (6.25 mg total) by mouth 2 (two) times daily with a meal. 12/02/20   Kathlen Mody, MD  Cholecalciferol (VITAMIN D) 125 MCG (5000 UT) CAPS Take 10,000 Units by mouth once a week. Wednesday    [provider]  diphenhydrAMINE (BENADRYL) 2 % cream Apply 1 application topically 3 (three) times daily as needed for itching.    [provider]  divalproex (DEPAKOTE) 500 MG DR tablet Take 500 mg by mouth 2 (two) times daily.    [provider]  docusate sodium (COLACE) 100 MG capsule Take 1 capsule (100 mg total) by mouth 2 (two) times daily as needed for mild constipation. 01/05/20   Regalado, Belkys A, MD  ferrous sulfate 324 MG TBEC Take 324 mg by mouth daily.    [provider]  insulin aspart (NOVOLOG) 100 UNIT/ML injection CBG 70 - 120: 0 units CBG 121 - 150: 2 units CBG 151 - 200: 3 units CBG 201 - 250: 5 units CBG 251 - 300: 8 units CBG 301 - 350: 11 units CBG 351 - 400: 15 units 12/02/20   Kathlen Mody, MD  Ipratropium-Albuterol (COMBIVENT RESPIMAT) 20-100 MCG/ACT AERS respimat Inhale 1 puff into the lungs every 12 (twelve) hours.    [provider]  JANUVIA 50 MG tablet Take 50 mg by mouth daily. 03/27/21   [provider]  Multiple Vitamin (MULTIVITAMIN WITH MINERALS) TABS tablet Take 1 tablet by mouth daily.    [provider]  pantoprazole (PROTONIX) 40 MG tablet Take 1 tablet (40 mg total) by mouth daily. 04/07/21   Glade Lloyd, MD  polyethylene glycol (MIRALAX / GLYCOLAX) 17 g packet Take 17 g by mouth daily. 04/07/21   Glade Lloyd, MD  predniSONE (DELTASONE) 20 MG tablet Take 1 tablet (20 mg total) by mouth daily with breakfast. 04/07/21   Glade Lloyd, MD  risperiDONE (RISPERDAL) 2 MG tablet Take 0.5 tablets (1 mg total) by mouth 2 (two) times daily. 04/07/21   Glade Lloyd, MD  sennosides-docusate sodium (SENOKOT-S) 8.6-50 MG tablet Take  2 tablets by mouth daily.    [provider]  tamsulosin (FLOMAX) 0.4 MG CAPS capsule Take 1 capsule (0.4 mg total) by mouth daily. Patient taking differently: Take 0.4 mg by mouth every other day. 12/03/20   Kathlen Mody, MD    Allergies    Chlorhexidine gluconate, Vancomycin, and Acetazolamide er  Review of Systems   Review of Systems  All other systems reviewed and are negative.  Physical Exam Updated Vital Signs BP (!) 145/79 (BP Location: Right Arm)   Pulse 94   Temp 99.1 F (37.3 C)   Resp (!) 35   LMP  (LMP Unknown)   SpO2 97%   Physical Exam Vitals and nursing note reviewed.  Constitutional:      General: She is not in acute distress.    Appearance: Normal appearance. She is well-developed.  HENT:     Head: Normocephalic and atraumatic.  Eyes:     Conjunctiva/sclera: Conjunctivae normal.     Pupils: Pupils are equal, round, and reactive to light.  Cardiovascular:     Rate and Rhythm: Normal rate and regular rhythm.     Heart sounds: Normal heart sounds.  Pulmonary:     Effort: Pulmonary effort is normal. No respiratory distress.     Breath sounds: Normal breath sounds.  Abdominal:     General: There is no distension.      Palpations: Abdomen is soft.     Tenderness: There is no abdominal tenderness.  Musculoskeletal:        General: No deformity. Normal range of motion.     Cervical back: Normal range of motion and neck supple.  Skin:    General: Skin is warm and dry.  Neurological:     General: No focal deficit present.     Mental Status: She is alert and oriented to person, place, and time.    ED Results / Procedures / Treatments   Labs (all labs ordered are listed, but only abnormal results are displayed) Labs Reviewed  CULTURE, BLOOD (ROUTINE X 2)  CULTURE, BLOOD (ROUTINE X 2)  URINE CULTURE  RESP PANEL BY RT-PCR (FLU A&B, COVID) ARPGX2  URINALYSIS, ROUTINE W REFLEX MICROSCOPIC  COMPREHENSIVE METABOLIC PANEL  LACTIC ACID, PLASMA  LACTIC ACID, PLASMA  BRAIN NATRIURETIC PEPTIDE  CBC WITH DIFFERENTIAL/PLATELET  PROTIME-INR  TROPONIN I (HIGH SENSITIVITY)    EKG EKG Interpretation  Date/Time:  Monday April 10 2021 10:09:56 EDT Ventricular Rate:  88 PR Interval:  139 QRS Duration: 80 QT Interval:  332 QTC Calculation: 402 R Axis:   48 Text Interpretation: Sinus rhythm Multiple premature complexes, vent & supraven Borderline T wave abnormalities Confirmed by Kristine Royal [id number]) on 04/10/2021 10:19:40 AM  Radiology No results found.  Procedures Procedures   Medications Ordered in ED Medications - No data to display  ED Course  I have reviewed the triage vital signs and the nursing notes.  Pertinent labs & imaging results that were available during my care of the patient were reviewed by me and considered in my medical decision making (see chart for details).    MDM Rules/Calculators/A&P                           CRITICAL CARE Performed by: Wynetta Fines   Total critical care time: 30 minutes  Critical care time was exclusive of separately billable procedures and treating other patients.  Critical care was necessary to treat or prevent imminent or life-threatening  deterioration.  Critical care was time spent personally by me on the following activities: development of treatment plan with patient and/or surrogate as well as nursing, discussions with consultants, evaluation of patient's response to treatment, examination of patient, obtaining history from patient or surrogate, ordering and performing treatments and interventions, ordering and review of laboratory studies, ordering and review of radiographic studies, pulse oximetry and re-evaluation of patient's condition.    MDM  MSE complete  Becky SersJanina Hendricks was evaluated in Emergency Department on 04/10/2021 for the symptoms described in the history of present illness. She was evaluated in the context of the global COVID-19 pandemic, which necessitated consideration that the patient might be at risk for infection with the SARS-CoV-2 virus that causes COVID-19. Institutional protocols and algorithms that pertain to the evaluation of patients at risk for COVID-19 are in a state of rapid change based on information released by regulatory bodies including the CDC and federal and state organizations. These policies and algorithms were followed during the patient's care in the ED.  Patient is presenting by EMS for evaluation of reported confusion and apparent dyspnea with worsening hypoxia.  Initial ABG is concerning for elevated CO2 to 107.  pH is 7.3.  Patient was initiated on BiPAP in the ED.  Critical care consulted.  Patient is well-known to critical care with recent admission August 17 through August 26.  She required intubation twice during that stay.  Patient with significant history of hypercarbic respiratory failure.  Patient is full code.  Hospitalist service is aware of case and will evaluate for admission.     Final Clinical Impression(s) / ED Diagnoses Final diagnoses:  Hypercarbia  Altered mental status, unspecified altered mental status type    Rx / DC Orders ED Discharge Orders      None        Wynetta FinesMessick, Lylah Lantis C, MD 04/10/21 1219

## 2021-04-10 NOTE — Progress Notes (Addendum)
PCCM Interval Progress Note  Asked to see pt at bedside due to AMS.  Per RN, she was obtunded and BiPAP had just been started 2 - 3 minutes prior to my arrival.  As I arrived, she is awake and interactive moving all extremities to command.  Her last ABG was from 11am which demonstrated acute on chronic hypercapnic respiratory failure. She is now answering basic questions and pointing to the BiPAP asking for it to come off.  Dr. Vassie Loll and I reinforced the need for BiPAP to avoid the risk of further deterioration to the point we may be forced into mechanical ventilation (which per chart review, she does NOT want).  Furthermore, upon chart review, she had been agitated and non-compliant earlier in the shift (same pattern on previous hospitalization where I actually participated in her care at [facility]).  She was started on low dose precedex to help facilitate her care (not for sedation, rather to limit her interruptions in care such as removing O2, pulling at lines etc). After Precedex was started, she became gradually more hypotensive.   Precedex has since been stopped and BP is gradually improving.   We will continue BiPAP as she will tolerate.  To help calm her agitation, we will give 5mg  Haldol x 1 now then continue PRN as ordered.  Additionally, we will discontinue precedex given hypotension. Hold all antihypertensives.   , PA - [facility] For pager details, please see AMION or use Epic chat  After 1900, please call [facility] for cross coverage needs 04/10/2021, 9:42 PM

## 2021-04-10 NOTE — Progress Notes (Signed)
eLink Physician-Brief Progress Note Patient Name: Becky Hendricks DOB: 1946/01/24 MRN: [id number]   Date of Service  04/10/2021  HPI/Events of Note  Patient with Afib with RVR. Rate 110-130, BP 117/97.  eICU Interventions  Cardizem 5 mg iv x 1 ordered.        Migdalia Dk 04/10/2021, 8:36 PM

## 2021-04-10 NOTE — Progress Notes (Signed)
Patient confused and refusing interventions. She did not want to take her scheduled carvedilol and insulin, but after significant education she was agreeable to take medications. She was adamant about not wearing her oxygen, and when her SpO2 dropped to 82% she agreed to put it back on but does not want to wear her bipap mask. She does not like being educated about the inevitability of requiring intubation if her CO2 continues to climb. Her brother [name] was called and she spoke to him, voicing her concerns. This RN spoke to him as well and he understands the severity of her illness and potential need for intubation if she were to decline further. He stated he will be in town tomorrow to have a goals of care discussion as he is out of town.

## 2021-04-10 NOTE — ED Notes (Signed)
Pt very upset about Bipap mask and is taking it off. Pt states she cannot wear it and feels like she will die if she has to wear it. Will notify resp and admitting.

## 2021-04-10 NOTE — ED Triage Notes (Signed)
Pt arrives via EMS for altered mental status. Pt called out for help at 0845 this morning at facility. Staff states she was weak and unable to move right arm. EMS reports pt is weak all over and minimally responsive, breathing 35 times a min. Pt is bed bound at baseline and normally can communicate. Per EMS pt's capnography was 75. Pt wears 3L [place] at all times- sats 91%- EMS increased to 5L  sats improved to 98%. BP 150/76, CBG 215. Pt recently hospitalized for sepsis, resp depression- requiring intubation.

## 2021-04-11 DIAGNOSIS — Z7189 Other specified counseling: Secondary | ICD-10-CM | POA: Diagnosis not present

## 2021-04-11 DIAGNOSIS — Z515 Encounter for palliative care: Secondary | ICD-10-CM

## 2021-04-11 DIAGNOSIS — J9622 Acute and chronic respiratory failure with hypercapnia: Secondary | ICD-10-CM | POA: Diagnosis not present

## 2021-04-11 DIAGNOSIS — G9340 Encephalopathy, unspecified: Secondary | ICD-10-CM

## 2021-04-11 LAB — CBC
HCT: 34.9 % — ABNORMAL LOW (ref 36.0–46.0)
Hemoglobin: 10.7 g/dL — ABNORMAL LOW (ref 12.0–15.0)
MCH: 28.2 pg (ref 26.0–34.0)
MCHC: 30.7 g/dL (ref 30.0–36.0)
MCV: 91.8 fL (ref 80.0–100.0)
Platelets: 180 10*3/uL (ref 150–400)
RBC: 3.8 MIL/uL — ABNORMAL LOW (ref 3.87–5.11)
RDW: 14.4 % (ref 11.5–15.5)
WBC: 11.8 10*3/uL — ABNORMAL HIGH (ref 4.0–10.5)
nRBC: 0 % (ref 0.0–0.2)

## 2021-04-11 LAB — COMPREHENSIVE METABOLIC PANEL
ALT: 8 U/L (ref 0–44)
AST: 9 U/L — ABNORMAL LOW (ref 15–41)
Albumin: 2.2 g/dL — ABNORMAL LOW (ref 3.5–5.0)
Alkaline Phosphatase: 28 U/L — ABNORMAL LOW (ref 38–126)
Anion gap: 6 (ref 5–15)
BUN: 18 mg/dL (ref 8–23)
CO2: 43 mmol/L — ABNORMAL HIGH (ref 22–32)
Calcium: 8.5 mg/dL — ABNORMAL LOW (ref 8.9–10.3)
Chloride: 90 mmol/L — ABNORMAL LOW (ref 98–111)
Creatinine, Ser: 0.53 mg/dL (ref 0.44–1.00)
GFR, Estimated: >60 mL/min (ref >60)
Glucose, Bld: 117 mg/dL — ABNORMAL HIGH (ref 70–99)
Potassium: 3.8 mmol/L (ref 3.5–5.1)
Sodium: 139 mmol/L (ref 135–145)
Total Bilirubin: 0.8 mg/dL (ref 0.3–1.2)
Total Protein: 4.7 g/dL — ABNORMAL LOW (ref 6.5–8.1)

## 2021-04-11 LAB — MAGNESIUM: Magnesium: 1.8 mg/dL (ref 1.7–2.4)

## 2021-04-11 LAB — URINALYSIS, COMPLETE (UACMP) WITH MICROSCOPIC
Bilirubin Urine: NEGATIVE
Glucose, UA: NEGATIVE mg/dL
Ketones, ur: 5 mg/dL — AB
Nitrite: NEGATIVE
Protein, ur: 100 mg/dL — AB
Specific Gravity, Urine: 1.021 (ref 1.005–1.030)
WBC, UA: >50 WBC/hpf — ABNORMAL HIGH (ref 0–5)
pH: 7 (ref 5.0–8.0)

## 2021-04-11 LAB — GLUCOSE, CAPILLARY
Glucose-Capillary: 123 mg/dL — ABNORMAL HIGH (ref 70–99)
Glucose-Capillary: 132 mg/dL — ABNORMAL HIGH (ref 70–99)
Glucose-Capillary: 143 mg/dL — ABNORMAL HIGH (ref 70–99)
Glucose-Capillary: 144 mg/dL — ABNORMAL HIGH (ref 70–99)
Glucose-Capillary: 148 mg/dL — ABNORMAL HIGH (ref 70–99)
Glucose-Capillary: 282 mg/dL — ABNORMAL HIGH (ref 70–99)

## 2021-04-11 LAB — AMMONIA: Ammonia: 16 umol/L (ref 9–35)

## 2021-04-11 LAB — PHOSPHORUS: Phosphorus: 3.1 mg/dL (ref 2.5–4.6)

## 2021-04-11 LAB — BRAIN NATRIURETIC PEPTIDE: B Natriuretic Peptide: 181 pg/mL — ABNORMAL HIGH (ref 0.0–100.0)

## 2021-04-11 MED ORDER — PHENYLEPHRINE HCL-NACL 20-0.9 MG/250ML-% IV SOLN
25.0000 ug/min | INTRAVENOUS | Status: DC
  Administered 2021-04-11: 25 ug/min via INTRAVENOUS
  Filled 2021-04-11: qty 250

## 2021-04-11 MED ORDER — MAGNESIUM SULFATE 2 GM/50ML IV SOLN
2.0000 g | Freq: Once | INTRAVENOUS | Status: AC
  Administered 2021-04-11: 2 g via INTRAVENOUS
  Filled 2021-04-11: qty 50

## 2021-04-11 MED ORDER — SODIUM CHLORIDE 0.9 % IV SOLN
250.0000 mL | INTRAVENOUS | Status: DC
  Administered 2021-04-11: 250 mL via INTRAVENOUS

## 2021-04-11 MED ORDER — RISPERIDONE 2 MG PO TBDP
2.0000 mg | ORAL_TABLET | Freq: Two times a day (BID) | ORAL | Status: DC
  Administered 2021-04-11 – 2021-04-14 (×7): 2 mg via ORAL
  Filled 2021-04-11 (×7): qty 1

## 2021-04-11 MED ORDER — LACTATED RINGERS IV BOLUS
250.0000 mL | Freq: Once | INTRAVENOUS | Status: AC
  Administered 2021-04-11: 250 mL via INTRAVENOUS

## 2021-04-11 MED ORDER — METOPROLOL TARTRATE 12.5 MG HALF TABLET
12.5000 mg | ORAL_TABLET | Freq: Four times a day (QID) | ORAL | Status: DC
  Administered 2021-04-11 – 2021-04-19 (×28): 12.5 mg via ORAL
  Filled 2021-04-11 (×31): qty 1

## 2021-04-11 NOTE — Progress Notes (Signed)
RT unable to obtain ABG. 

## 2021-04-11 NOTE — Sepsis Progress Note (Signed)
[facility] ADULT ICU REPLACEMENT PROTOCOL   The patient does apply for the [facility] Adult ICU Electrolyte Replacment Protocol based on the criteria listed below:   1.Exclusion criteria: TCTS patients, ECMO patients and Hypothermia Protocol, and   Dialysis patients 2. Is GFR >/= 30 ml/min? Yes.    Patient's GFR today is >60 3. Is SCr </= 2? Yes.   Patient's SCr is 0.53 mg/dL 4. Did SCr increase >/= 0.5 in 24 hours? No. 5.Pt's weight >40kg  Yes.   6. Abnormal electrolyte(s): Mag1.8  7. Electrolytes replaced per protocol 8.  Call MD STAT for K+ </= 2.5, Phos </= 1, or Mag </= 1 Physician:  Lebron Conners 04/11/2021 6:42 AM

## 2021-04-11 NOTE — Progress Notes (Addendum)
NAME:  Becky Hendricks, MRN:  [id number], DOB:  August 22, 1945, LOS: 1 ADMISSION DATE:  04/10/2021, CONSULTATION DATE:  8/29 REFERRING MD:  Roosevelt Locks, CHIEF COMPLAINT:  acute on chronic respiratory failure    History of Present Illness:   This is a 75 year old female who was just discharged from the hospital on 8/26 after being admitted for Sepsis, septic shock, and acute hypoxic and hypercarbic respiratory failure requiring mechanical ventilation.  She was treated successfully but felt to have significant underlying element of OHS/OSA and was actually discharged on BiPAP at at bedtime with 2 to 3 L of supplemental oxygen during the daytime.  Presented to the emergency room from skilled nursing facility on 8/29 with altered mental status.  On EMS arrival found to be minimally responsive and tachypneic with respiratory rate in the mid 30s.  Initial capnography by EMS was 75.  Pulse oximetry 91% on arrival.  Arterial blood gas obtained showed pH of 7.31, PCO2 of 108, PO2 of 95, HCO3 54.4 she was awake and oriented reporting weakness.  She was placed on NIPPV, but because of her acute on chronic hypercarbia critical care was asked to evaluate   Pertinent  Medical History  Lives in nursing home Hypertension, diabetes, atrial fibrillation, obesity, heart failure preserved EF, seizure disorder, bipolar disease. Her MRSA infection Known RV dysfxn Just discharged 04/07/2021 after being admitted for acute on chronic hypercarbic and hypoxic respiratory failure Prior septic shock Significant Hospital Events: Including procedures, antibiotic start and stop dates in addition to other pertinent events   8/29 admitted w/ altered mental status and acute on chronic hypercarbia placed on NIPPV. Mental status improved PCCM consulted.  Brother here 8/30 for goals of care conference , patient knows self, brother, date , city and month, but she is unable to understand simple concepts regarding her care.   Interim History /  Subjective:  States she wants to go home with her brother Gets upset easily, currently off BiPAP, awake, alert and confused at intervals Currently off BiPAP, on oxygen at 4 L  with sats of 96% Less combative 8/30 Pt is currently off BiPAP with the hope she can participate in goals of care conversation with Palliation later this morning  Objective   Blood pressure (!) 128/94, pulse (!) 102, temperature 98.7 F (37.1 C), temperature source Axillary, resp. rate 18, weight 107.2 kg, SpO2 99 %.    Vent Mode: BIPAP;PCV FiO2 (%):  [30 %-40 %] 30 % Set Rate:  [18 bmp] 18 bmp PEEP:  [5 cmH20] 5 cmH20   Intake/Output Summary (Last 24 hours) at 04/11/2021 0940 Last data filed at 04/11/2021 0600 Gross per 24 hour  Intake 456.09 ml  Output 50 ml  Net 406.09 ml   Filed Weights   04/11/21 0435  Weight: 107.2 kg    Examination: General: Obese, very deconditioned and debilitated 75 year old female supine in bed and alert HENT: Normocephalic atraumatic no jugular venous distention, No LAD Lungs: Bilateral chest excursion, diminished per bases, few scattered rhonchi Cardiovascular: S1, S2, Regular rate and rhythm, a fib  per tele Abdomen: Obese soft no organomegaly, NT, ND, BS + Extremities: Warm dry 2+  lower extremity edema bilateral foot drop, brisk refill Neuro: Awake oriented x2 very impulsive,  Able to follow simple commands but very inattentive.  Upper extremities fairly good strength very weak lower extremities GU: Incontinent  Labs reviewed 8/30  Na 139/ K 3.8/ CO2 43/ Creatinine 0.53/ BUN 18/ Mag 1.8/ Alk Phos 28/ Albumin 2.2 BNP 181 WBC  11.8 ( 19.1) , HGB 10.7, Platelets 180 K, T Max 99.7 + 406 cc's ( 50 cc UO documented last 24 hours)  Minimal UO last 24 hours, urine with sediment and smells foul, will send for UA. If + for nitrites will need culturing  CXR 8/29>> Persistent low lung volumes  chronic elevation of the right hemidiaphragm bibasilar opacities (right greater than  left) atelectasis vs  consolidation.  Mag repletion ordered and hung this am  Resolved Hospital Problem list     Assessment & Plan:  Acute on chronic hypercarbic respiratory failure, secondary to decompensated OHS/OSA (present on admission) Chronic elevated right hemidiaphragm, prior COVID and severe deconditioning.  - because of this she needs nocturnal NIPPV and without it this will keep happening.  - Her baseline CO2 calculated to around 87 -She does not use the BiPAP at the nursing home, states she does not want to use it "it is killing me" I discussed with her her options which are: Use the BiPAP, proceed to intubation, then trach with nocturnal ventilation, or lastly comfort care.  She initially  indicates she would prefer comfort care, however at the time  CO2 over 100  so unable to safely rely on her decision making ability Plan Admit to the intensive care Cycle on and off BiPAP IV Lasix Pulse oximetry Palliative care consult.  Her brother is planning on coming once again from [place] on 8/30.  He does recognize his sisters poor quality of life, he states he would not choose this for himself but this is what she wants. Goal continues to be to  try to avoid intubation if possible  Acute metabolic encephalopathy (present on admission) superimposed on underlying bipolar disease , ? UTI Acutely complicated by hypercarbia.  She is oriented x2, but impulsive, and uncooperative.  Refusing to utilize her BiPAP at this point Plan Supportive care Holding risperidone, but continue Depakote Serial neurochecks Urine for UA, if + for nitrites, will need to culture  Atrial fibrillation, anticoagulation/DOAC Plan Telemetry Continue Coreg and apixaban Mag goal > 2, K goal > 4  History of HFpEF, and hypertension Plan  continue Norvasc IV Lasix  Mild Leukocytosis She is not infected Plan No abx   History of diabetes type 2 Plan Sliding scale insulin  History of seizure  disorder Plan Depakote Mag goal > 2  Sacral decubitus ulcer stage I (present on admission) Plan Wound ostomy consult  Severe physical deconditioning status post multiple and prolonged/recurrent illnesses Plan Supportive care  Medical noncompliance.  Almost certainly somewhat exacerbated by her underlying psychological disease Plan Supportive care Plan  to discuss goals of care further with her brother  Urinary incontinence Plan Skin care  Best Practice (right click and "Reselect all SmartList Selections" daily)   Diet/type: NPO w/ oral meds DVT prophylaxis: DOAC GI prophylaxis: PPI Lines: N/A Foley:  N/A Code Status:  full code Last date of multidisciplinary goals of care discussion occurred 04/11/2021.  She did have a MOST form, requesting full code.  Laurey Arrow  called her brother [name] he lives up in [place].  He told him  she is not able to make decisions for herself.  But he also reports that she wants all of the care we have been providing often telling him "thank you for helping me".  He does verbalize that her quality of life is poor, and he would not choose this for himself but he is convinced this is what she wants.  Of note I told her her  options really are 3: #1 use BiPAP, #2 intubate, and eventually proceed to tracheostomy and nocturnal ventilation or #3, DO NOT RESUSCITATE, she indicated she would rather not live than use #1 or 2 however her was over 100 so  we cannot  rely on this to make decisions.  Her brother is here at bedside, and Palliation and the PCCM team met with him today 04/11/21. Per her brother , she has episodes of confusion when she has an infection or when she is taken off her psych medications. He is concerned these are the reasons she is so confused now. He is reasonable and is trying to make the best decisions he can for his sister. He believes her quality of life is being able to sit in bed, watch TV or her computer , and eat. We discussed the real  potential of need for intubation and tracheostomy and feeding tubes , in addition to logistical issues of finding facilities that could manage her care. We discussed that these facilities could often be out of state, which make visitation hard. We also discussed that despite trach and vent, there would be continuous hospitalizations for infections etc. Additionally we discussed that the intervals where she is well, are getting shorter and shorter. He was given a book by Palliation NP to help with understanding some of these decisions. He did state that he would not want her to live intubated and trached with a feeding tube. We did explain that he could make decisions to make her comfortable  but she has always been emphatic that he do everything to maintain her.  We left the plan to update him daily on her condition, and he will continue to read the book Palliative services provided him with and think about what we discussed in regard to decisions he makes for her moving forward. For now she remains a Full Code .   Labs   CBC: Recent Labs  Lab 04/05/21 0323 04/06/21 0428 04/07/21 0452 04/10/21 1017 04/10/21 1107 04/11/21 0220  WBC 20.2* 15.5* 14.7* 19.1*  --  11.8*  NEUTROABS  --  11.4* 8.7* 13.4*  --   --   HGB 10.3* 10.1* 10.5* 11.9* 12.6 10.7*  HCT 33.8* 33.5* 35.2* 39.7 37.0 34.9*  MCV 93.1 93.8 93.9 95.7  --  91.8  PLT 209 196 196 190  --  459    Basic Metabolic Panel: Recent Labs  Lab 04/05/21 0323 04/06/21 0428 04/07/21 0452 04/10/21 1017 04/10/21 1107 04/11/21 0220  NA 135 141 139 138 138 139  K 3.6 3.6 3.9 4.3 3.8 3.8  CL 95* 94* 93* 87*  --  90*  CO2 35* 38* 41* 47*  --  43*  GLUCOSE 179* 226* 151* 186*  --  117*  BUN 26* 25* 23 18  --  18  CREATININE 0.66 0.67 0.56 0.44  --  0.53  CALCIUM 8.5* 8.4* 8.7* 8.7*  --  8.5*  MG  --  1.9 1.8  --   --  1.8  PHOS 2.4* 3.1  --   --   --  3.1   GFR: Estimated Creatinine Clearance: 77.6 mL/min (by C-G formula based on SCr of  0.53 mg/dL). Recent Labs  Lab 04/05/21 0323 04/06/21 0428 04/07/21 0452 04/10/21 1017 04/10/21 1550 04/11/21 0220  PROCALCITON <0.10  --   --   --  <0.10  --   WBC 20.2* 15.5* 14.7* 19.1*  --  11.8*  LATICACIDVEN  --   --   --  1.2  --   --     Liver Function Tests: Recent Labs  Lab 04/05/21 0323 04/10/21 1017 04/11/21 0220  AST 9* 12* 9*  ALT '8 8 8  ' ALKPHOS 27* 35* 28*  BILITOT 0.4 0.7 0.8  PROT 5.0* 5.4* 4.7*  ALBUMIN 2.3* 2.5* 2.2*   No results for input(s): LIPASE, AMYLASE in the last 168 hours. No results for input(s): AMMONIA in the last 168 hours.  ABG    Component Value Date/Time   PHART 7.312 (L) 04/10/2021 1107   PCO2ART 107.9 (HH) 04/10/2021 1107   PO2ART 95 04/10/2021 1107   HCO3 54.4 (H) 04/10/2021 1107   TCO2 >50 (H) 04/10/2021 1107   O2SAT 95.0 04/10/2021 1107     Coagulation Profile: Recent Labs  Lab 04/10/21 1017  INR 1.1    Cardiac Enzymes: No results for input(s): CKTOTAL, CKMB, CKMBINDEX, TROPONINI in the last 168 hours.  HbA1C: Hgb A1c MFr Bld  Date/Time Value Ref Range Status  03/30/2021 03:11 AM 7.0 (H) 4.8 - 5.6 % Final    Comment:    (NOTE) Pre diabetes:          5.7%-6.4%  Diabetes:              >6.4%  Glycemic control for   <7.0% adults with diabetes   11/18/2020 01:54 AM 6.1 (H) 4.8 - 5.6 % Final    Comment:    (NOTE) Pre diabetes:          5.7%-6.4%  Diabetes:              >6.4%  Glycemic control for   <7.0% adults with diabetes     CBG: Recent Labs  Lab 04/10/21 1534 04/10/21 1923 04/10/21 2353 04/11/21 0321 04/11/21 0745  GLUCAP 145* 190* 125* 123* 143*       Home Medications  Prior to Admission medications   Medication Sig Start Date End Date Taking? Authorizing Provider  amLODipine (NORVASC) 10 MG tablet Take 1 tablet (10 mg total) by mouth daily. 12/03/20   Hosie Poisson, MD  apixaban (ELIQUIS) 5 MG TABS tablet Take 5 mg by mouth 2 (two) times daily.    [provider]  atorvastatin  (LIPITOR) 10 MG tablet Take 10 mg by mouth daily.    [provider]  calcium carbonate (TUMS - DOSED IN MG ELEMENTAL CALCIUM) 500 MG chewable tablet Chew 500 mg by mouth daily.    [provider]  carvedilol (COREG) 6.25 MG tablet Take 1 tablet (6.25 mg total) by mouth 2 (two) times daily with a meal. 12/02/20   Hosie Poisson, MD  Cholecalciferol (VITAMIN D) 125 MCG (5000 UT) CAPS Take 10,000 Units by mouth once a week. Wednesday    [provider]  diphenhydrAMINE (BENADRYL) 2 % cream Apply 1 application topically 3 (three) times daily as needed for itching.    [provider]  divalproex (DEPAKOTE) 500 MG DR tablet Take 500 mg by mouth 2 (two) times daily.    [provider]  docusate sodium (COLACE) 100 MG capsule Take 1 capsule (100 mg total) by mouth 2 (two) times daily as needed for mild constipation. 01/05/20   Regalado, Belkys A, MD  ferrous sulfate 324 MG TBEC Take 324 mg by mouth daily.    [provider]  insulin aspart (NOVOLOG) 100 UNIT/ML injection CBG 70 - 120: 0 units CBG 121 - 150: 2 units CBG 151 - 200: 3 units CBG 201 - 250: 5 units CBG  251 - 300: 8 units CBG 301 - 350: 11 units CBG 351 - 400: 15 units 12/02/20   Hosie Poisson, MD  Ipratropium-Albuterol (COMBIVENT RESPIMAT) 20-100 MCG/ACT AERS respimat Inhale 1 puff into the lungs every 12 (twelve) hours.    [provider]  JANUVIA 50 MG tablet Take 50 mg by mouth daily. 03/27/21   [provider]  Multiple Vitamin (MULTIVITAMIN WITH MINERALS) TABS tablet Take 1 tablet by mouth daily.    [provider]  pantoprazole (PROTONIX) 40 MG tablet Take 1 tablet (40 mg total) by mouth daily. 04/07/21   Aline August, MD  polyethylene glycol (MIRALAX / GLYCOLAX) 17 g packet Take 17 g by mouth daily. 04/07/21   Aline August, MD  predniSONE (DELTASONE) 20 MG tablet Take 1 tablet (20 mg total) by mouth daily with breakfast. 04/07/21   Aline August, MD   risperiDONE (RISPERDAL) 2 MG tablet Take 0.5 tablets (1 mg total) by mouth 2 (two) times daily. 04/07/21   Aline August, MD  sennosides-docusate sodium (SENOKOT-S) 8.6-50 MG tablet Take 2 tablets by mouth daily.    [provider]  tamsulosin (FLOMAX) 0.4 MG CAPS capsule Take 1 capsule (0.4 mg total) by mouth daily. Patient taking differently: Take 0.4 mg by mouth every other day. 12/03/20   Hosie Poisson, MD     Critical care time:  68 minutes    Magdalen Spatz, MSN, AGACNP-BC [place] for personal pager PCCM on call pager [phone]  04/11/2021 9:40 AM

## 2021-04-11 NOTE — Consult Note (Signed)
                                                                                 Consultation Note Date: 04/11/2021   Patient Name: Becky Hendricks  DOB: 02/19/1946  MRN: [id number]  Age / Sex: 75 y.o., female  PCP: Hartman, Lisa, MD Referring Physician: Meier, Nathaniel M, MD  Reason for Consultation: Establishing goals of care  HPI/Patient Profile: 75 y.o. female  with past medical history of chronic hypercapnic respiratory failure, paroxysmal atrial fibrillation on anticoagulation, and bipolar disorder who  She was recently hospitalized for septic shock and acute hypoxic and hypercarbic respiratory failure requiring mechanical ventilation. She was treated successfully but felt to have a significant underlying element of OHS/OSA and was discharged 8/26 on BiPAP at bedtime with 2-3 L of supplemental oxygen during the day.  She presented to the emergency department on 04/10/2021 with altered mental status and tachypnea.  In the ED, her ABG showed a PCO2 of 108. She was placed on BiPAP and tolerated it until she started to wake up. She then refused to wear it. She was admitted to PCCM.   Clinical Assessment and Goals of Care: I have reviewed medical records including EPIC notes, labs and imaging, and received updated from the bedside RN. Patient continues to be confused with intermittent agitation, and labile affect. She is currently on oxygen by nasal cannula.   Myself as well as PCCM (Dr. Meier and Sarah NP) met with patient's  brother George in the 3M family conference room to discuss diagnosis, prognosis, GOC, EOL wishes, disposition, and options.   Patient is known to PMT from previous hospitalizations. I re-introduced Palliative Medicine as specialized medical care for people living with serious illness. It focuses on providing relief from the symptoms and stress of a serious illness.   We discussed a brief life review of the patient. She was born in [place], moved to [place], then later  immigrated to [place] with her family in 1966. 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 George shares that his sister is very intelligent and able to speak 5 languages. She did not marry or have children. She relocated to [place] to be closer to her brother. She lived independently in her condo there until December 2020, when she suffered a fall.   Patient has resided at [name] since February 2021. Her functional status has declined since then. George shares that her life consists of "siting in bed, eating, and watching TV". George states that this seems to be acceptable quality of life for his sister.   We discussed patient's current illness and what it means in the larger context of her ongoing co-morbidities. Natural disease trajectory of chronic illness was discussed. We reviewed in detail her past medical history, previous hospitalizations, as well as her current hospital course. Concern was expressed to George that patient is showing a pattern of recurrent critical illness requiring hospitalization. George is very familiar with her previous hospitalizations. He shares that she has had 6 major illnesses over the past 21 months. In his words, she has "snapped out of it" and "bounced back" each time.      Discussed values and goals of care important to the patient. She has apparently expressed to [facility] on multiple occasions that "she wants everything done to keep her alive". He is trying to honor her wishes as long as possible.  Patient has had 3 previous episodes of respiratory failure requiring intubation, and so far has been successfully weaned and extubated each time. We emphasized to [name] that there would likely come a time when this is not the case. The next step would be tracheostomy and possible ventilator dependence. This would also mean dependence on artificial feeding, transferring to an LTAC, and potential complications such as infections and skin break down.    [name] indicates that he understands there will come a time when he has to make a "tough decision". However, at this time he is unable to set any limits around the patient's scope of treatment.   [name] expresses appreciation for the information provided, and that he is receptive to recommendations from the medical team. Discussed the importance of continued conversation with [name] and the medical team regarding overall plan of care and treatment options, ensuring decisions are within the context of the patients values and GOCs.  I provided [name] with PMT contact card and encouraged him to call with questions or concerns.    Primary decision maker: Marcelino Duster    SUMMARY OF RECOMMENDATIONS   Full code full scope [name] is not able to set any limits around scope of treatment at this time PMT will continue to follow intermittently - please call if there are more urgent needs [phone]  Code Status/Advance Care Planning: Full code  Symptom Management:  Per PPCM  Palliative Prophylaxis:  Oral Care  Additional Recommendations (Limitations, Scope, Preferences): Full Scope Treatment  Prognosis:  Unable to determine  Discharge Planning: To Be Determined      Primary Diagnoses: Present on Admission:  Hypoxia  Acute on chronic respiratory failure ([place])   I have reviewed the medical record, interviewed the patient and family, and examined the patient. The following aspects are pertinent.  Past Medical History:  Diagnosis Date   Atrial fibrillation ([place])    Bipolar disorder ([place])    Bipolar disorder ([place]) 04/27/2020   Clostridium difficile diarrhea 08/02/2019   Dehydration    Essential hypertension    Fall 02/11/2020   Morbid obesity ([place])    Osteomyelitis ([place]) 12/03/2019   Pneumonia due to COVID-19 virus 09/15/2019   Type 2 diabetes mellitus ([place])    Weakness      Family History  Family history unknown: Yes   Scheduled Meds:  apixaban  5 mg Oral BID    atorvastatin  10 mg Oral Daily   budesonide (PULMICORT) nebulizer solution  2 mg Nebulization Q6H   divalproex  500 mg Oral BID   insulin aspart  0-15 Units Subcutaneous Q4H   ipratropium-albuterol  3 mL Nebulization Q6H   metoprolol tartrate  12.5 mg Oral Q6H   pantoprazole  40 mg Oral Daily   risperiDONE  2 mg Oral BID   tamsulosin  0.4 mg Oral QODAY   Continuous Infusions:  sodium chloride 10 mL/hr at 04/10/21 1318   sodium chloride Stopped (04/11/21 1419)   PRN Meds:.haloperidol lactate   Allergies  Allergen Reactions   Chlorhexidine Gluconate Itching   Vancomycin     Vancomycin infusion related reaction- May need Vanc to be given at a slower rate    Acetazolamide Er Rash   Review of Systems  Unable to perform ROS: Other  Physical Exam Vitals reviewed.  Constitutional:      General: She is not in acute distress. Cardiovascular:     Rate and Rhythm: Tachycardia present.  Pulmonary:     Effort: Pulmonary effort is normal.  Neurological:     Mental Status: She is alert. She is confused.  Psychiatric:        Mood and Affect: Affect is labile.    Vital Signs: BP 124/83   Pulse (!) 117   Temp 98.8 F (37.1 C) (Axillary)   Resp (!) 21   Wt 107.2 kg   LMP  (LMP Unknown)   SpO2 95%   BMI 38.15 kg/m  Pain Scale: 0-10   Pain Score: 0-No pain   SpO2: SpO2: 95 % O2 Device:SpO2: 95 % O2 Flow Rate: .O2 Flow Rate (L/min): 4 L/min  IO: Intake/output summary:  Intake/Output Summary (Last 24 hours) at 04/11/2021 1742 Last data filed at 04/11/2021 1500 Gross per 24 hour  Intake 670.3 ml  Output 150 ml  Net 520.3 ml    LBM: Last BM Date: 04/11/21 Baseline Weight: Weight: 107.2 kg Most recent weight: Weight: 107.2 kg      Palliative Assessment/Data: PPS 30%     Time In: 1030 Time Out: 1140 Time Total: 70 minutes Greater than 50%  of this time was spent counseling and coordinating care related to the above assessment and plan.  Signed by: Lavena Bullion, NP   Please contact Palliative Medicine Team phone at [phone] for questions and concerns.  For individual provider: See Shea Evans

## 2021-04-11 NOTE — Progress Notes (Signed)
Brief Progress Note  Per brother, patient has terrible vision and wears glasses. She has lost 3 pairs of glasses with hospital admissions. She is confused and anxious .I feel sure her inability to see is most likely making this worse.  I have consulted case management to see if they can assist with getting patient some glasses,   Becky Ngo, MSN, AGACNP-BC [facility] Pulmonary/Critical Care Medicine See [place] for personal pager PCCM on call pager [phone]  04/11/2021 12:26 PM

## 2021-04-11 NOTE — Progress Notes (Signed)
Consulted by Pulmonology NP to get eye glasses for patient to make sure it's not directly impacting her confusion and anxiety as she cannot see. Notified NP that CM cannot get patient eye glasses without patient having an appointment with an optometrist or ophthalmologist. CM contacted patient's brother Aoki Wedemeyer [phone]) about getting patient's glasses from home if she has one at home. Brother stated that patient had three glasses which she lost at this hospital and does not have any at this time. Stated he does not know who her Ophthalmologist or Optometrist is. Also, patient does not have any prescription he can use to get her one. NP notified through secured chat.

## 2021-04-11 NOTE — Progress Notes (Signed)
Patient refuses to wear BIPAP tonight.

## 2021-04-11 NOTE — Consult Note (Signed)
   WOC consulted for sacral pressure injury; reviewed records patient was seen by [facility] nurse 03/30/21 for same and at that time patient has no wound; she has pink healed scar tissue over the sacrum. Images reviewed from 5/22 also reveal same; healed pink tissue  Discussed with bedside staff both nurse and NT.  During bath assessment of the sacrum does not reveal presence of a wound.  She has moisture associated skin damage from exposure to bowl and bladder incontinence.  Will add Gerhardt's butt cream after discussion with nurse Selena Batten.  Staff instructed if they note any other skin issues when they are able to assess again please contact WOC nursing team.   [facility] MSN,RN,CWOCN, CNS, CWON-AP [phone]

## 2021-04-11 NOTE — Progress Notes (Addendum)
eLink Physician-Brief Progress Note Patient Name: Becky Hendricks DOB: [birth year] MRN: [id number]   Date of Service  04/11/2021  HPI/Events of Note  Patient is hypotensive, Afib with RVR, rate of 122. She has two piv's.  eICU Interventions  Peripheral Phenylephrine gtt ordered. LR 250 ml iv bolus x 1 ordered as well.        Becky Hendricks 04/11/2021, 2:49 AM

## 2021-04-12 ENCOUNTER — Encounter (HOSPITAL_COMMUNITY): Payer: Self-pay | Admitting: Internal Medicine

## 2021-04-12 ENCOUNTER — Inpatient Hospital Stay (HOSPITAL_COMMUNITY): Payer: Medicare Other

## 2021-04-12 DIAGNOSIS — J9622 Acute and chronic respiratory failure with hypercapnia: Secondary | ICD-10-CM | POA: Diagnosis not present

## 2021-04-12 LAB — CBC WITH DIFFERENTIAL/PLATELET
Abs Immature Granulocytes: 0.17 10*3/uL — ABNORMAL HIGH (ref 0.00–0.07)
Basophils Absolute: 0.1 10*3/uL (ref 0.0–0.1)
Basophils Relative: 1 %
Eosinophils Absolute: 1.2 10*3/uL — ABNORMAL HIGH (ref 0.0–0.5)
Eosinophils Relative: 11 %
HCT: 34 % — ABNORMAL LOW (ref 36.0–46.0)
Hemoglobin: 10.6 g/dL — ABNORMAL LOW (ref 12.0–15.0)
Immature Granulocytes: 2 %
Lymphocytes Relative: 28 %
Lymphs Abs: 2.9 10*3/uL (ref 0.7–4.0)
MCH: 28.3 pg (ref 26.0–34.0)
MCHC: 31.2 g/dL (ref 30.0–36.0)
MCV: 90.9 fL (ref 80.0–100.0)
Monocytes Absolute: 1.1 10*3/uL — ABNORMAL HIGH (ref 0.1–1.0)
Monocytes Relative: 11 %
Neutro Abs: 5 10*3/uL (ref 1.7–7.7)
Neutrophils Relative %: 47 %
Platelets: 160 10*3/uL (ref 150–400)
RBC: 3.74 MIL/uL — ABNORMAL LOW (ref 3.87–5.11)
RDW: 14.8 % (ref 11.5–15.5)
WBC: 10.4 10*3/uL (ref 4.0–10.5)
nRBC: 0 % (ref 0.0–0.2)

## 2021-04-12 LAB — COMPREHENSIVE METABOLIC PANEL
ALT: 8 U/L (ref 0–44)
AST: 9 U/L — ABNORMAL LOW (ref 15–41)
Albumin: 2.1 g/dL — ABNORMAL LOW (ref 3.5–5.0)
Alkaline Phosphatase: 30 U/L — ABNORMAL LOW (ref 38–126)
Anion gap: 8 (ref 5–15)
BUN: 15 mg/dL (ref 8–23)
CO2: 42 mmol/L — ABNORMAL HIGH (ref 22–32)
Calcium: 8.3 mg/dL — ABNORMAL LOW (ref 8.9–10.3)
Chloride: 89 mmol/L — ABNORMAL LOW (ref 98–111)
Creatinine, Ser: 0.47 mg/dL (ref 0.44–1.00)
GFR, Estimated: >60 mL/min (ref >60)
Glucose, Bld: 133 mg/dL — ABNORMAL HIGH (ref 70–99)
Potassium: 3 mmol/L — ABNORMAL LOW (ref 3.5–5.1)
Sodium: 139 mmol/L (ref 135–145)
Total Bilirubin: 0.3 mg/dL (ref 0.3–1.2)
Total Protein: 4.4 g/dL — ABNORMAL LOW (ref 6.5–8.1)

## 2021-04-12 LAB — MAGNESIUM: Magnesium: 2.2 mg/dL (ref 1.7–2.4)

## 2021-04-12 LAB — GLUCOSE, CAPILLARY
Glucose-Capillary: 94 mg/dL (ref 70–99)
Glucose-Capillary: 192 mg/dL — ABNORMAL HIGH (ref 70–99)
Glucose-Capillary: 193 mg/dL — ABNORMAL HIGH (ref 70–99)
Glucose-Capillary: 210 mg/dL — ABNORMAL HIGH (ref 70–99)
Glucose-Capillary: 227 mg/dL — ABNORMAL HIGH (ref 70–99)

## 2021-04-12 LAB — MRSA NEXT GEN BY PCR, NASAL: MRSA by PCR Next Gen: DETECTED — AB

## 2021-04-12 IMAGING — DX DG CHEST 1V PORT
1 series · 1 of 1 positions shown · non-contrast
Comparison: Chest x-ray [DATE]

CLINICAL DATA: Acute respiratory failure with hypoxia.

EXAM:
PORTABLE CHEST 1 VIEW

[chest ap]
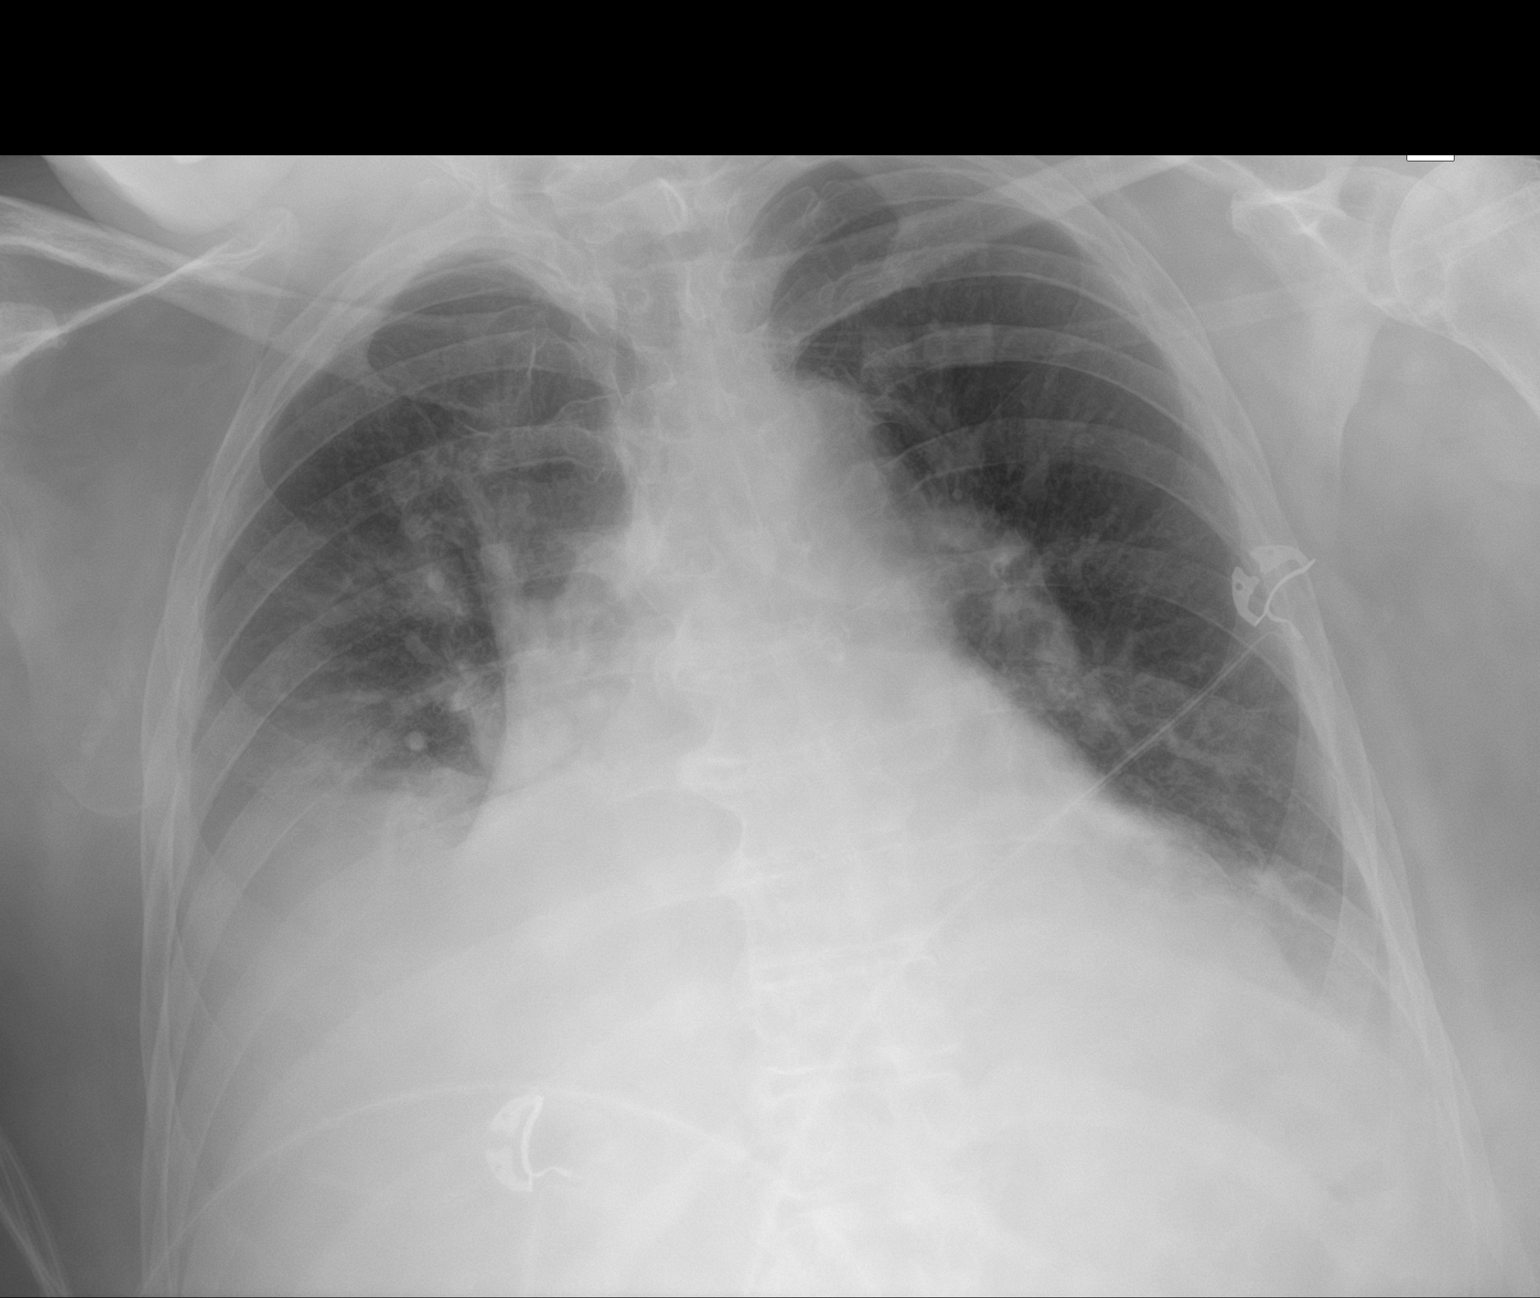

[1 of 1 positions shown; findings below may reference images not displayed]

FINDINGS: Heart is enlarged. Right greater than left lower lobe airspace
disease and effusion again noted. Pulmonary congestion is stable.
There is no significant interval change.
IMPRESSION: 1. Stable appearance of right greater than left lower lobe airspace
disease and effusion.
2. Stable cardiomegaly and pulmonary congestion.

## 2021-04-12 MED ORDER — IPRATROPIUM-ALBUTEROL 0.5-2.5 (3) MG/3ML IN SOLN: 3.0000 mL | Freq: Four times a day (QID) | RESPIRATORY_TRACT | Status: DC | PRN

## 2021-04-12 MED ORDER — INSULIN ASPART 100 UNIT/ML IJ SOLN
0.0000 [IU] | Freq: Three times a day (TID) | INTRAMUSCULAR | Status: DC
  Administered 2021-04-12 (×2): 5 [IU] via SUBCUTANEOUS
  Administered 2021-04-13 (×2): 3 [IU] via SUBCUTANEOUS
  Administered 2021-04-13: 8 [IU] via SUBCUTANEOUS
  Administered 2021-04-13: 5 [IU] via SUBCUTANEOUS
  Administered 2021-04-14 (×3): 3 [IU] via SUBCUTANEOUS
  Administered 2021-04-15: 2 [IU] via SUBCUTANEOUS
  Administered 2021-04-15: 5 [IU] via SUBCUTANEOUS
  Administered 2021-04-15: 3 [IU] via SUBCUTANEOUS
  Administered 2021-04-16: 2 [IU] via SUBCUTANEOUS
  Administered 2021-04-16 – 2021-04-17 (×2): 3 [IU] via SUBCUTANEOUS
  Administered 2021-04-17: 2 [IU] via SUBCUTANEOUS
  Administered 2021-04-17: 3 [IU] via SUBCUTANEOUS
  Administered 2021-04-17 – 2021-04-18 (×2): 2 [IU] via SUBCUTANEOUS
  Administered 2021-04-18: 3 [IU] via SUBCUTANEOUS
  Administered 2021-04-18: 5 [IU] via SUBCUTANEOUS
  Administered 2021-04-19: 2 [IU] via SUBCUTANEOUS
  Administered 2021-04-19: 3 [IU] via SUBCUTANEOUS

## 2021-04-12 MED ORDER — LOPERAMIDE HCL 2 MG PO CAPS
2.0000 mg | ORAL_CAPSULE | ORAL | Status: DC | PRN
  Administered 2021-04-12 – 2021-04-14 (×4): 2 mg via ORAL
  Filled 2021-04-12 (×4): qty 1

## 2021-04-12 MED ORDER — POTASSIUM CHLORIDE CRYS ER 20 MEQ PO TBCR
20.0000 meq | EXTENDED_RELEASE_TABLET | ORAL | Status: AC
Start: 2021-04-12 — End: 2021-04-12
  Administered 2021-04-12 (×2): 20 meq via ORAL
  Filled 2021-04-12 (×2): qty 1

## 2021-04-12 MED ORDER — POTASSIUM CHLORIDE 10 MEQ/100ML IV SOLN
10.0000 meq | INTRAVENOUS | Status: AC
  Administered 2021-04-12 (×4): 10 meq via INTRAVENOUS
  Filled 2021-04-12 (×4): qty 100

## 2021-04-12 MED ORDER — BUDESONIDE 0.5 MG/2ML IN SUSP
0.5000 mg | Freq: Two times a day (BID) | RESPIRATORY_TRACT | Status: DC
  Administered 2021-04-13 – 2021-04-19 (×10): 0.5 mg via RESPIRATORY_TRACT
  Filled 2021-04-12 (×13): qty 2

## 2021-04-12 NOTE — Progress Notes (Signed)
[facility] ADULT ICU REPLACEMENT PROTOCOL   The patient does apply for the [facility] Adult ICU Electrolyte Replacment Protocol based on the criteria listed below:   1.Exclusion criteria: TCTS patients, ECMO patients and Hypothermia Protocol, and   Dialysis patients 2. Is GFR >/= 30 ml/min? Yes.    Patient's GFR today is >60 3. Is SCr </= 2? Yes.   Patient's SCr is 0.47 mg/dL 4. Did SCr increase >/= 0.5 in 24 hours? No. 5.Pt's weight >40kg  Yes.   6. Abnormal electrolyte(s):  K 3.0  7. Electrolytes replaced per protocol 8.  Call MD STAT for K+ </= 2.5, Phos </= 1, or Mag </= 1 Physician:  Shawn Stall R Lathyn Griggs 04/12/2021 1:54 AM

## 2021-04-12 NOTE — Progress Notes (Signed)
Patient continues to refuse to wear BIPAP. Pt stated "she would rather go on a ventilator than wear the mask".

## 2021-04-12 NOTE — Progress Notes (Signed)
Pt continued to refuse BIPAP and neb txs.

## 2021-04-12 NOTE — Progress Notes (Signed)
NAME:  Becky Hendricks, MRN:  [id number], DOB:  1945-11-19, LOS: 2 ADMISSION DATE:  04/10/2021, CONSULTATION DATE:  8/29 REFERRING MD:  Roosevelt Locks, CHIEF COMPLAINT:  acute on chronic respiratory failure    History of Present Illness:   This is a 75 year old female who was just discharged from the hospital on 8/26 after being admitted for Sepsis, septic shock, and acute hypoxic and hypercarbic respiratory failure requiring mechanical ventilation.  She was treated successfully but felt to have significant underlying element of OHS/OSA and was actually discharged on BiPAP at at bedtime with 2 to 3 L of supplemental oxygen during the daytime.  Presented to the emergency room from skilled nursing facility on 8/29 with altered mental status.  On EMS arrival found to be minimally responsive and tachypneic with respiratory rate in the mid 30s.  Initial capnography by EMS was 75.  Pulse oximetry 91% on arrival.  Arterial blood gas obtained showed pH of 7.31, PCO2 of 108, PO2 of 95, HCO3 54.4 she was awake and oriented reporting weakness.  She was placed on NIPPV, but because of her acute on chronic hypercarbia critical care was asked to evaluate   Pertinent  Medical History  Lives in nursing home Hypertension, diabetes, atrial fibrillation, obesity, heart failure preserved EF, seizure disorder, bipolar disease. Her MRSA infection Known RV dysfxn Just discharged 04/07/2021 after being admitted for acute on chronic hypercarbic and hypoxic respiratory failure Prior septic shock Significant Hospital Events: Including procedures, antibiotic start and stop dates in addition to other pertinent events   8/29 admitted w/ altered mental status and acute on chronic hypercarbia placed on NIPPV. Mental status improved PCCM consulted.  Brother here 8/30 for goals of care conference , patient knows self, brother, date , city and month, but she is unable to understand simple concepts regarding her care. Brother mulling over  whether trach/PEG would be compatible with her goals of care. He says he does think she'd be content resting in bed, watching tv. Does enjoy eating however, he's not sure how she'd feel about losing the ability to eat.   Interim History / Subjective:  Off BiPAP, alert this morning. Tells me she is sad about all of the freedoms that were taken away from her since she's been in the hospital but she does not specify further. She says she thinks she lost her brother's trust.   Tachycardic, didn't get night dose of metoprolol due to soft BP.  Objective   Blood pressure 105/78, pulse (!) 115, temperature 98.4 F (36.9 C), temperature source Oral, resp. rate (!) 22, weight 107.2 kg, SpO2 99 %.        Intake/Output Summary (Last 24 hours) at 04/12/2021 7001 Last data filed at 04/12/2021 0030 Gross per 24 hour  Intake 591.84 ml  Output 250 ml  Net 341.84 ml   Filed Weights   04/11/21 0435  Weight: 107.2 kg    Examination: General appearance: 75 y.o., female, NAD, conversant, pleasant Eyes: anicteric sclerae, moist conjunctivae; no lid-lag; PERRL, tracking appropriately HENT: NCAT; dry MM Neck: Trachea midline; no lymphadenopathy, no JVD Lungs: Diminished b/l, no crackles, no wheeze, with normal respiratory effort CV: tachycardic,, RR, no MRGs  Abdomen: Soft, non-tender; non-distended, BS present  Extremities: 1-2+ dependent edema, warm Skin: Normal temperature, turgor and texture; no rash Neuro: follows commands. Inattentive.   Labs reviewed 8/31  UA with pyuria, no UCx yet  hypokalemic  Mag repletion ordered and hung this am  CXR this am with no significant change  Resolved Hospital Problem list     Assessment & Plan:  Acute on chronic hypercarbic respiratory failure, secondary to decompensated OHS/OSA (present on admission) Chronic elevated right hemidiaphragm, prior COVID and severe deconditioning.  - encourage NIV at night and with naps - net negative fluid balance -  palliative following  Acute metabolic encephalopathy (present on admission) superimposed on underlying bipolar disease , ? UTI Acutely complicated by hypercarbia.  She is oriented x2, but impulsive, and uncooperative.  Refusing to utilize her BiPAP at this point Plan Delirium precautions Home psychotropics Start ceftriaxone once UCx obtained as below  Complicated UTI: - start ceftriaxone once UCx obtained  Atrial fibrillation, anticoagulation/DOAC Plan - metoprolol 12.5 and titrate up as tolerated - treatment of UTI as above  History of HFpEF, and hypertension Plan  Hold norvasc Target net negative fluid balance  History of diabetes type 2 Plan Sliding scale insulin  History of seizure disorder Plan Depakote  Sacral decubitus ulcer stage I (present on admission) Plan WOCN   Severe physical deconditioning status post multiple and prolonged/recurrent illnesses Plan Supportive care  Urinary incontinence Plan Skin care  Best Practice (right click and "Reselect all SmartList Selections" daily)   Diet/type: NPO w/ oral meds DVT prophylaxis: DOAC GI prophylaxis: PPI Lines: N/A Foley:  N/A Code Status:  full code Disposition: to progressive if we can get her heart rate under better control. Last date of multidisciplinary goals of care discussion occurred 04/11/2021.  She did have a MOST form, requesting full code.  Laurey Arrow  called her brother [name] he lives up in [place].  He told him  she is not able to make decisions for herself.  But he also reports that she wants all of the care we have been providing often telling him "thank you for helping me".  He does verbalize that her quality of life is poor, and he would not choose this for himself but he is convinced this is what she wants.  Of note I told her her options really are 3: #1 use BiPAP, #2 intubate, and eventually proceed to tracheostomy and nocturnal ventilation or #3, DO NOT RESUSCITATE, she indicated she would  rather not live than use #1 or 2 however her was over 100 so  we cannot  rely on this to make decisions.  Her brother is here at bedside, and Palliation and the PCCM team met with him today 04/11/21. Per her brother , she has episodes of confusion when she has an infection or when she is taken off her psych medications. He is concerned these are the reasons she is so confused now. He is reasonable and is trying to make the best decisions he can for his sister. He believes her quality of life is being able to sit in bed, watch TV or her computer , and eat. We discussed the real potential of need for intubation and tracheostomy and feeding tubes , in addition to logistical issues of finding facilities that could manage her care. We discussed that these facilities could often be out of state, which make visitation hard. We also discussed that despite trach and vent, there would be continuous hospitalizations for infections etc. Additionally we discussed that the intervals where she is well, are getting shorter and shorter. He was given a book by Palliation NP to help with understanding some of these decisions. He did state that he would not want her to live intubated and trached with a feeding tube. We did explain that he could make  decisions to make her comfortable  but she has always been emphatic that he do everything to maintain her.  We left the plan to update him daily on her condition, and he will continue to read the book Palliative services provided him with and think about what we discussed in regard to decisions he makes for her moving forward. For now she remains a Full Code .        Fredirick Maudlin Pulmonary/Critical Care   04/12/2021 8:52 AM

## 2021-04-13 DIAGNOSIS — J9601 Acute respiratory failure with hypoxia: Secondary | ICD-10-CM | POA: Diagnosis not present

## 2021-04-13 DIAGNOSIS — R4182 Altered mental status, unspecified: Secondary | ICD-10-CM

## 2021-04-13 DIAGNOSIS — J9602 Acute respiratory failure with hypercapnia: Secondary | ICD-10-CM | POA: Diagnosis not present

## 2021-04-13 LAB — URINE CULTURE: Culture: NO GROWTH

## 2021-04-13 LAB — GLUCOSE, CAPILLARY
Glucose-Capillary: 146 mg/dL — ABNORMAL HIGH (ref 70–99)
Glucose-Capillary: 158 mg/dL — ABNORMAL HIGH (ref 70–99)
Glucose-Capillary: 204 mg/dL — ABNORMAL HIGH (ref 70–99)
Glucose-Capillary: 271 mg/dL — ABNORMAL HIGH (ref 70–99)

## 2021-04-13 LAB — BASIC METABOLIC PANEL
Anion gap: 4 — ABNORMAL LOW (ref 5–15)
BUN: 15 mg/dL (ref 8–23)
CO2: 40 mmol/L — ABNORMAL HIGH (ref 22–32)
Calcium: 8 mg/dL — ABNORMAL LOW (ref 8.9–10.3)
Chloride: 94 mmol/L — ABNORMAL LOW (ref 98–111)
Creatinine, Ser: 0.83 mg/dL (ref 0.44–1.00)
GFR, Estimated: >60 mL/min (ref >60)
Glucose, Bld: 132 mg/dL — ABNORMAL HIGH (ref 70–99)
Potassium: 3.9 mmol/L (ref 3.5–5.1)
Sodium: 138 mmol/L (ref 135–145)

## 2021-04-13 MED ORDER — KCL-LACTATED RINGERS 20 MEQ/L IV SOLN
INTRAVENOUS | Status: DC
  Filled 2021-04-13: qty 1000

## 2021-04-13 MED ORDER — LACTATED RINGERS IV BOLUS
250.0000 mL | Freq: Once | INTRAVENOUS | Status: AC
  Administered 2021-04-13: 250 mL via INTRAVENOUS

## 2021-04-13 MED ORDER — POTASSIUM CHLORIDE 2 MEQ/ML IV SOLN
INTRAVENOUS | Status: AC
  Filled 2021-04-13 (×3): qty 1000

## 2021-04-13 NOTE — Progress Notes (Signed)
Pt sleeping yet easy to arouse. Aox3-4 on 2L [place] with sats in high 90s. RR increased. Pt still refusing to wear BiPAP. Pt educated on benefits/risks yet she refuses. Will continue to monitor respiratory and neuro status.

## 2021-04-13 NOTE — Progress Notes (Signed)
This nurse called Elink at 0327 for a request to switch lopressor to IV due to patient being on bipap. Around 0340 patient ripped off bipap and refused the bipap mask to be placed back on. 2LNC placed on patient at this time. Patient is now able to take PO medications. No new orders at this time. Lopressor will now be given at 0352. Lopressor was given for a-fib rate control per day shift CCM MD.

## 2021-04-13 NOTE — Progress Notes (Signed)
Pt did go on bipap for a few hours during the night after being more sleepy.

## 2021-04-13 NOTE — Progress Notes (Signed)
eLink Physician-Brief Progress Note Patient Name: Becky Hendricks DOB: 08/02/1946 MRN: [id number]   Date of Service  04/13/2021  HPI/Events of Note  Patient's blood pressure is soft.  eICU Interventions  Hold scheduled oral Lopressor dose for tonight.        Thomasene Lot Becky Hendricks 04/13/2021, 3:49 AM

## 2021-04-13 NOTE — Progress Notes (Signed)
eLink Physician-Brief Progress Note Patient Name: Becky Hendricks DOB: Apr 10, 1946 MRN: [id number]   Date of Service  04/13/2021  HPI/Events of Note  Patient with soft blood pressures, she is not on maintenance iv fluids and is - 700 ml fluid balance already this shift , BNP was 181 2 days ago. K+was 3.0 yesterday.  eICU Interventions  LR 250 ml iv bolus x 1 ordered, LR with 20 meq KCL gtt at 75 ml / hour x 24 hours ordered.        Thomasene Lot Braylen Staller 04/13/2021, 2:26 AM

## 2021-04-13 NOTE — Progress Notes (Signed)
PROGRESS NOTE  Becky Hendricks VXB:[id number] DOB: 07/27/1946 DOA: 04/10/2021 PCP: Roderic Scarce, MD   LOS: 3 days   Brief Narrative / Interim history: This is a 75 year old female who was just discharged from the hospital on 8/26 after being admitted for Sepsis, septic shock, and acute hypoxic and hypercarbic respiratory failure requiring mechanical ventilation.  She was treated successfully but felt to have significant underlying element of OHS/OSA and was actually discharged on BiPAP at at bedtime with 2 to 3 L of supplemental oxygen during the daytime.  She was initially admitted to the ICU but has been transferred to the hospitalist service on 9/1.  She was altered on admission, minimally responsive, tachypneic and ABG showed a pH of 7.31, PCO2 of 108 and PO2 of 95.  She was placed on NIPPV but did not need to be intubated  Subjective / 24h Interval events: She is asking for a solid breakfast this morning, she tells me very clearly that she does not want a mask on  Assessment & Plan: Principal Problem Acute on chronic hypercarbic respiratory failure, secondary to decompensated OHS/OSA, chronically right elevated hemidiaphragm, prior Covid and severe deconditioning -continue to encourage NIPPV. -Long discussion with the brother over the phone today, he tells me that when she is her normal self she is agreeable to NIPPV but when she is "out of it" she will refuse. -Palliative following as well  Active Problems Acute metabolic encephalopathy superimposed on underlying bipolar disease -complicated by hypercarbia, impulsive at times, intermittently uncooperative. -Continue risperidone, Depakote -Continue to encourage NIPPV  History of permanent A. fib-continue Eliquis, continue metoprolol as her blood pressure allows.  She is rate controlled  Chronic diastolic CHF-she appears euvolemic  Essential hypertension-soft blood pressures currently, continue metoprolol as tolerated, hold  Norvasc  Complicated UTI-continue ceftriaxone, monitor urine cultures  Sacral decubitus ulcer, stage I, POA -local wound care  History of type 2 diabetes mellitus-Continue sliding scale  CBG (last 3)  Recent Labs    04/12/21 1702 04/12/21 2130 04/13/21 0719  GLUCAP 210* 227* 146*    Scheduled Meds:  apixaban  5 mg Oral BID   atorvastatin  10 mg Oral Daily   budesonide (PULMICORT) nebulizer solution  0.5 mg Nebulization BID   divalproex  500 mg Oral BID   insulin aspart  0-15 Units Subcutaneous TID AC & HS   metoprolol tartrate  12.5 mg Oral Q6H   pantoprazole  40 mg Oral Daily   risperiDONE  2 mg Oral BID   tamsulosin  0.4 mg Oral QODAY   Continuous Infusions:  sodium chloride 10 mL/hr at 04/10/21 1318   sodium chloride Stopped (04/12/21 0825)   lactated ringers with KCl/Additives Pediatric custom IV fluid 75 mL/hr at 04/13/21 0800   PRN Meds:.haloperidol lactate, ipratropium-albuterol, loperamide  Diet Orders (From admission, onward)     Start     Ordered   04/12/21 0807  Diet Carb Modified Fluid consistency: Thin; Room service appropriate? Yes  Diet effective now       Question Answer Comment  Diet-HS Snack? Nothing   Calorie Level Medium 1600-2000   Fluid consistency: Thin   Room service appropriate? Yes      04/12/21 0807            DVT prophylaxis:  apixaban (ELIQUIS) tablet 5 mg     Code Status: Full Code  Family Communication: Called and updated brother [name] over the phone  Status is: Inpatient  Remains inpatient appropriate because:Inpatient level of care appropriate due  to severity of illness  Dispo: The patient is from: SNF              Anticipated d/c is to: SNF              Patient currently is not medically stable to d/c.   Difficult to place patient No   Level of care: Progressive  Consultants:  PCCM Palliative care  Procedures:  none  Microbiology  Cultures - pending  Antimicrobials: Ceftriaxone 8/31 >>     Objective: Vitals:   04/13/21 0700 04/13/21 0735 04/13/21 0800 04/13/21 0850  BP: 100/68  102/74   Pulse: 92  (!) 110 98  Resp: 14  (!) 21 18  Temp:  97.9 F (36.6 C)    TempSrc:  Oral    SpO2: 100%  95% 97%  Weight:        Intake/Output Summary (Last 24 hours) at 04/13/2021 0951 Last data filed at 04/13/2021 0800 Gross per 24 hour  Intake 1100.08 ml  Output 1350 ml  Net -249.92 ml   Filed Weights   04/11/21 0435 04/13/21 0500  Weight: 107.2 kg 101.7 kg    Examination:  Constitutional: NAD Eyes: no scleral icterus ENMT: Mucous membranes are moist.  Neck: normal, supple Respiratory: clear to auscultation bilaterally, no wheezing, no crackles. Normal respiratory effort.  Cardiovascular: Regular rate and rhythm, no murmurs / rubs / gallops. No LE edema.  Abdomen: non distended, no tenderness. Bowel sounds positive.  Musculoskeletal: no clubbing / cyanosis.  Skin: no rashes Neurologic: CN 2-12 grossly intact. Strength 5/5 in all 4.   Data Reviewed: I have independently reviewed following labs and imaging studies   CBC: Recent Labs  Lab 04/07/21 0452 04/10/21 1017 04/10/21 1107 04/11/21 0220 04/12/21 0016  WBC 14.7* 19.1*  --  11.8* 10.4  NEUTROABS 8.7* 13.4*  --   --  5.0  HGB 10.5* 11.9* 12.6 10.7* 10.6*  HCT 35.2* 39.7 37.0 34.9* 34.0*  MCV 93.9 95.7  --  91.8 90.9  PLT 196 190  --  180 160   Basic Metabolic Panel: Recent Labs  Lab 04/07/21 0452 04/10/21 1017 04/10/21 1107 04/11/21 0220 04/12/21 0016 04/13/21 0320  NA 139 138 138 139 139 138  K 3.9 4.3 3.8 3.8 3.0* 3.9  CL 93* 87*  --  90* 89* 94*  CO2 41* 47*  --  43* 42* 40*  GLUCOSE 151* 186*  --  117* 133* 132*  BUN 23 18  --  18 15 15   CREATININE 0.56 0.44  --  0.53 0.47 0.83  CALCIUM 8.7* 8.7*  --  8.5* 8.3* 8.0*  MG 1.8  --   --  1.8 2.2  --   PHOS  --   --   --  3.1  --   --    Liver Function Tests: Recent Labs  Lab 04/10/21 1017 04/11/21 0220 04/12/21 0016  AST 12* 9* 9*  ALT  8 8 8   ALKPHOS 35* 28* 30*  BILITOT 0.7 0.8 0.3  PROT 5.4* 4.7* 4.4*  ALBUMIN 2.5* 2.2* 2.1*   Coagulation Profile: Recent Labs  Lab 04/10/21 1017  INR 1.1   HbA1C: No results for input(s): HGBA1C in the last 72 hours. CBG: Recent Labs  Lab 04/12/21 0756 04/12/21 1129 04/12/21 1702 04/12/21 2130 04/13/21 0719  GLUCAP 192* 193* 210* 227* 146*    Recent Results (from the past 240 hour(s))  Culture, blood (routine x 2)     Status: None (Preliminary  result)   Collection Time: 04/10/21 10:17 AM   Specimen: BLOOD  Result Value Ref Range Status   Specimen Description BLOOD RIGHT ANTECUBITAL  Final   Special Requests   Final    BOTTLES DRAWN AEROBIC AND ANAEROBIC Blood Culture results may not be optimal due to an inadequate volume of blood received in culture bottles   Culture   Final    NO GROWTH 3 DAYS Performed at [facility], [address] [place], [place] [address]    Report Status PENDING  Incomplete  Resp Panel by RT-PCR (Flu A&B, Covid) Nasopharyngeal Swab     Status: None   Collection Time: 04/10/21 10:18 AM   Specimen: Nasopharyngeal Swab; Nasopharyngeal(NP) swabs in vial transport medium  Result Value Ref Range Status   SARS Coronavirus 2 by RT PCR NEGATIVE NEGATIVE Final    Comment: (NOTE) SARS-CoV-2 target nucleic acids are NOT DETECTED.  The SARS-CoV-2 RNA is generally detectable in upper respiratory specimens during the acute phase of infection. The lowest concentration of SARS-CoV-2 viral copies this assay can detect is 138 copies/mL. A negative result does not preclude SARS-Cov-2 infection and should not be used as the sole basis for treatment or other patient management decisions. A negative result may occur with  improper specimen collection/handling, submission of specimen other than nasopharyngeal swab, presence of viral mutation(s) within the areas targeted by this assay, and inadequate number of viral copies(<138 copies/mL). A negative  result must be combined with clinical observations, patient history, and epidemiological information. The expected result is Negative.  Fact Sheet for Patients:  BloggerCourse.com  Fact Sheet for Healthcare Providers:  SeriousBroker.it  This test is no t yet approved or cleared by the [place] FDA and  has been authorized for detection and/or diagnosis of SARS-CoV-2 by FDA under an Emergency Use Authorization (EUA). This EUA will remain  in effect (meaning this test can be used) for the duration of the COVID-19 declaration under Section 564(b)(1) of the Act, 21 U.S.C.section 360bbb-3(b)(1), unless the authorization is terminated  or revoked sooner.       Influenza A by PCR NEGATIVE NEGATIVE Final   Influenza B by PCR NEGATIVE NEGATIVE Final    Comment: (NOTE) The Xpert Xpress SARS-CoV-2/FLU/RSV plus assay is intended as an aid in the diagnosis of influenza from Nasopharyngeal swab specimens and should not be used as a sole basis for treatment. Nasal washings and aspirates are unacceptable for Xpert Xpress SARS-CoV-2/FLU/RSV testing.  Fact Sheet for Patients: BloggerCourse.com  Fact Sheet for Healthcare Providers: SeriousBroker.it  This test is not yet approved or cleared by the [place] FDA and has been authorized for detection and/or diagnosis of SARS-CoV-2 by FDA under an Emergency Use Authorization (EUA). This EUA will remain in effect (meaning this test can be used) for the duration of the COVID-19 declaration under Section 564(b)(1) of the Act, 21 U.S.C. section 360bbb-3(b)(1), unless the authorization is terminated or revoked.  Performed at [facility], [address] [place], [place] [address]   Culture, blood (routine x 2)     Status: None (Preliminary result)   Collection Time: 04/10/21 10:22 AM   Specimen: BLOOD LEFT HAND  Result Value Ref  Range Status   Specimen Description BLOOD LEFT HAND  Final   Special Requests   Final    BOTTLES DRAWN AEROBIC AND ANAEROBIC Blood Culture results may not be optimal due to an inadequate volume of blood received in culture bottles   Culture  Final    NO GROWTH 3 DAYS Performed at [facility], [address] [place], [place] [address]    Report Status PENDING  Incomplete  MRSA Next Gen by PCR, Nasal     Status: Abnormal   Collection Time: 04/12/21  2:38 PM   Specimen: Nasal Mucosa; Nasal Swab  Result Value Ref Range Status   MRSA by PCR Next Gen DETECTED (A) NOT DETECTED Final    Comment: RESULT CALLED TO, READ BACK BY AND VERIFIED WITH: A. LEWIS RN, AT [phone] 04/12/21 D.VANHOOK (NOTE) The GeneXpert MRSA Assay (FDA approved for NASAL specimens only), is one component of a comprehensive MRSA colonization surveillance program. It is not intended to diagnose MRSA infection nor to guide or monitor treatment for MRSA infections. Test performance is not FDA approved in patients less than 13 years old. Performed at [facility], [address], [place], [place] [address]      Radiology Studies: No results found.   Pamella Pert, MD, PhD [facility]  Between 7 am - 7 pm I am available, please contact me via Amion (for emergencies) or Securechat (non urgent messages)  Between 7 pm - 7 am I am not available, please contact night coverage MD/APP via Amion

## 2021-04-13 NOTE — Progress Notes (Signed)
Patient has refused BIPAP for tonight but patient stated she will take the breathing treatment tonight. Patient in no distress at this time.

## 2021-04-14 DIAGNOSIS — J9601 Acute respiratory failure with hypoxia: Secondary | ICD-10-CM | POA: Diagnosis not present

## 2021-04-14 DIAGNOSIS — R4182 Altered mental status, unspecified: Secondary | ICD-10-CM | POA: Diagnosis not present

## 2021-04-14 DIAGNOSIS — J9602 Acute respiratory failure with hypercapnia: Secondary | ICD-10-CM | POA: Diagnosis not present

## 2021-04-14 LAB — GLUCOSE, CAPILLARY
Glucose-Capillary: 114 mg/dL — ABNORMAL HIGH (ref 70–99)
Glucose-Capillary: 170 mg/dL — ABNORMAL HIGH (ref 70–99)
Glucose-Capillary: 183 mg/dL — ABNORMAL HIGH (ref 70–99)
Glucose-Capillary: 179 mg/dL — ABNORMAL HIGH (ref 70–99)

## 2021-04-14 LAB — POCT I-STAT 7, (LYTES, BLD GAS, ICA,H+H)
Acid-Base Excess: 13 mmol/L — ABNORMAL HIGH (ref 0.0–2.0)
Bicarbonate: 39.7 mmol/L — ABNORMAL HIGH (ref 20.0–28.0)
Calcium, Ion: 1.18 mmol/L (ref 1.15–1.40)
HCT: 31 % — ABNORMAL LOW (ref 36.0–46.0)
Hemoglobin: 10.5 g/dL — ABNORMAL LOW (ref 12.0–15.0)
O2 Saturation: 97 %
Patient temperature: 97.4
Potassium: 4.5 mmol/L (ref 3.5–5.1)
Sodium: 138 mmol/L (ref 135–145)
TCO2: 42 mmol/L — ABNORMAL HIGH (ref 22–32)
pCO2 arterial: 62.4 mmHg — ABNORMAL HIGH (ref 32.0–48.0)
pH, Arterial: 7.409 (ref 7.350–7.450)
pO2, Arterial: 88 mmHg (ref 83.0–108.0)

## 2021-04-14 LAB — COMPREHENSIVE METABOLIC PANEL
ALT: 7 U/L (ref 0–44)
AST: 6 U/L — ABNORMAL LOW (ref 15–41)
Albumin: 2 g/dL — ABNORMAL LOW (ref 3.5–5.0)
Alkaline Phosphatase: 29 U/L — ABNORMAL LOW (ref 38–126)
Anion gap: 5 (ref 5–15)
BUN: 13 mg/dL (ref 8–23)
CO2: 36 mmol/L — ABNORMAL HIGH (ref 22–32)
Calcium: 8 mg/dL — ABNORMAL LOW (ref 8.9–10.3)
Chloride: 97 mmol/L — ABNORMAL LOW (ref 98–111)
Creatinine, Ser: 0.5 mg/dL (ref 0.44–1.00)
GFR, Estimated: >60 mL/min (ref >60)
Glucose, Bld: 88 mg/dL (ref 70–99)
Potassium: 4 mmol/L (ref 3.5–5.1)
Sodium: 138 mmol/L (ref 135–145)
Total Bilirubin: 0.5 mg/dL (ref 0.3–1.2)
Total Protein: 4.5 g/dL — ABNORMAL LOW (ref 6.5–8.1)

## 2021-04-14 LAB — CBC
HCT: 33.1 % — ABNORMAL LOW (ref 36.0–46.0)
Hemoglobin: 10 g/dL — ABNORMAL LOW (ref 12.0–15.0)
MCH: 28.2 pg (ref 26.0–34.0)
MCHC: 30.2 g/dL (ref 30.0–36.0)
MCV: 93.5 fL (ref 80.0–100.0)
Platelets: 146 10*3/uL — ABNORMAL LOW (ref 150–400)
RBC: 3.54 MIL/uL — ABNORMAL LOW (ref 3.87–5.11)
RDW: 15 % (ref 11.5–15.5)
WBC: 11 10*3/uL — ABNORMAL HIGH (ref 4.0–10.5)
nRBC: 0.2 % (ref 0.0–0.2)

## 2021-04-14 LAB — VALPROIC ACID LEVEL: Valproic Acid Lvl: 41 ug/mL — ABNORMAL LOW (ref 50.0–100.0)

## 2021-04-14 LAB — AMMONIA: Ammonia: 12 umol/L (ref 9–35)

## 2021-04-14 MED ORDER — RISPERIDONE 2 MG PO TBDP
2.0000 mg | ORAL_TABLET | Freq: Two times a day (BID) | ORAL | Status: DC
  Administered 2021-04-14 – 2021-04-19 (×11): 2 mg via ORAL
  Filled 2021-04-14 (×14): qty 1

## 2021-04-14 MED ORDER — GERHARDT'S BUTT CREAM
TOPICAL_CREAM | Freq: Two times a day (BID) | CUTANEOUS | Status: DC
  Administered 2021-04-14: 1 via TOPICAL
  Filled 2021-04-14 (×2): qty 1

## 2021-04-14 MED ORDER — RISPERIDONE 1 MG PO TBDP
1.0000 mg | ORAL_TABLET | Freq: Every day | ORAL | Status: DC
  Administered 2021-04-14 – 2021-04-19 (×7): 1 mg via ORAL
  Filled 2021-04-14 (×6): qty 1

## 2021-04-14 NOTE — Progress Notes (Signed)
Pt transferred to 3E04 . Pt's brother, [name], called and updated on pt's new location.

## 2021-04-14 NOTE — Progress Notes (Signed)
PROGRESS NOTE  Becky Hendricks ZOX:[id number] DOB: 04/12/1946 DOA: 04/10/2021 PCP: Roderic ScarceHartman, Lisa, MD   LOS: 4 days   Brief Narrative / Interim history: This is a 75 year old female who was just discharged from the hospital on 8/26 after being admitted for Sepsis, septic shock, and acute hypoxic and hypercarbic respiratory failure requiring mechanical ventilation.  She was treated successfully but felt to have significant underlying element of OHS/OSA and was actually discharged on BiPAP at at bedtime with 2 to 3 L of supplemental oxygen during the daytime.  She was initially admitted to the ICU but has been transferred to the hospitalist service on 9/1.  She was altered on admission, minimally responsive, tachypneic and ABG showed a pH of 7.31, PCO2 of 108 and PO2 of 95.  She was placed on NIPPV but did not need to be intubated  Subjective / 24h Interval events: She is quite talkative this morning, she denies shortness of breath, no chest pain. She also tells me that she does not want to wear her BiPAP mask knowing that she could die.   Assessment & Plan: Principal Problem Acute on chronic hypercarbic respiratory failure, secondary to decompensated OHS/OSA, chronically right elevated hemidiaphragm, prior Covid and severe deconditioning -continue to encourage NIPPV. -Long discussion with the brother over the phone today, he tells me that when she is her normal self she is agreeable to NIPPV but when she is "out of it" she will refuse. -Palliative following as well  Active Problems Acute metabolic encephalopathy superimposed on underlying bipolar disease -complicated by hypercarbia, impulsive at times, intermittently uncooperative. -Continue risperidone, Depakote -Continue to encourage NIPPV -her medical issues seem to be stable currently -Today she starts telling me about her prior [place] acquaintances including a lady named Roxana who was apparently very manipulative in her past. She has  significant flight of ideas and she is telling me several stories from her past with storyline jumping without apparent logic to different locations, times and people from her past. She also tells me that she has a significant other and she needs to marry him formally, apparently they only had religious marriage, last 4 popes married her. She can only name the past 3 popes though. She also tells me that her mother was very beautiful and Hitler wanted her mother to have kids to be part of the a[place] race. -she seems to have a significant underlying psychiatric process for which I have consulted psychiatry today   History of permanent A. fib-continue Eliquis, continue metoprolol as her blood pressure allows.  She is rate controlled  Chronic diastolic CHF-she appears euvolemic  Essential hypertension-soft blood pressures currently, continue metoprolol as tolerated, hold Norvasc  Complicated UTI-urine cultures without growth, no need for ceftriaxone  Sacral decubitus ulcer, stage I, POA -local wound care  History of type 2 diabetes mellitus-Continue sliding scale  CBG (last 3)  Recent Labs    04/13/21 1533 04/13/21 2045 04/14/21 0735  GLUCAP 204* 271* 114*     Scheduled Meds:  apixaban  5 mg Oral BID   atorvastatin  10 mg Oral Daily   budesonide (PULMICORT) nebulizer solution  0.5 mg Nebulization BID   divalproex  500 mg Oral BID   insulin aspart  0-15 Units Subcutaneous TID AC & HS   metoprolol tartrate  12.5 mg Oral Q6H   pantoprazole  40 mg Oral Daily   risperiDONE  2 mg Oral BID   tamsulosin  0.4 mg Oral QODAY   Continuous Infusions:  sodium chloride 10  mL/hr at 04/10/21 1318   sodium chloride Stopped (04/12/21 0825)   PRN Meds:.haloperidol lactate, ipratropium-albuterol, loperamide  Diet Orders (From admission, onward)     Start     Ordered   04/12/21 0807  Diet Carb Modified Fluid consistency: Thin; Room service appropriate? Yes  Diet effective now       Question Answer  Comment  Diet-HS Snack? Nothing   Calorie Level Medium 1600-2000   Fluid consistency: Thin   Room service appropriate? Yes      04/12/21 0807            DVT prophylaxis:  apixaban (ELIQUIS) tablet 5 mg     Code Status: Full Code  Family Communication: Called and updated brother [name] over the phone  Status is: Inpatient  Remains inpatient appropriate because:Inpatient level of care appropriate due to severity of illness  Dispo: The patient is from: SNF              Anticipated d/c is to: SNF              Patient currently is not medically stable to d/c.   Difficult to place patient No   Level of care: Progressive  Consultants:  PCCM Palliative care  Procedures:  none  Microbiology  Cultures - pending  Antimicrobials: Ceftriaxone 8/31 >>    Objective: Vitals:   04/14/21 0500 04/14/21 0600 04/14/21 0700 04/14/21 0737  BP: 96/69 105/71 109/71   Pulse: 89 96 (!) 102   Resp: (!) 23 (!) 21 14   Temp:    98.4 F (36.9 C)  TempSrc:    Oral  SpO2: 100% 98% 95%   Weight:        Intake/Output Summary (Last 24 hours) at 04/14/2021 5625 Last data filed at 04/14/2021 0630 Gross per 24 hour  Intake 2207.42 ml  Output 953 ml  Net 1254.42 ml    Filed Weights   04/11/21 0435 04/13/21 0500 04/14/21 0407  Weight: 107.2 kg 101.7 kg 98.5 kg    Examination:  Constitutional: No distress Eyes: No icterus ENMT: mmm  Neck: normal, supple Respiratory: Clear bilaterally without wheezing or crackles Cardiovascular: Regular rate and rhythm, no murmurs, no edema Abdomen: Soft, NT, ND, bowel sounds positive Musculoskeletal: no clubbing / cyanosis.  Skin: No rashes seen Neurologic: No focal deficits  Data Reviewed: I have independently reviewed following labs and imaging studies   CBC: Recent Labs  Lab 04/10/21 1017 04/10/21 1107 04/11/21 0220 04/12/21 0016 04/14/21 0235  WBC 19.1*  --  11.8* 10.4 11.0*  NEUTROABS 13.4*  --   --  5.0  --   HGB 11.9* 12.6  10.7* 10.6* 10.0*  HCT 39.7 37.0 34.9* 34.0* 33.1*  MCV 95.7  --  91.8 90.9 93.5  PLT 190  --  180 160 146*    Basic Metabolic Panel: Recent Labs  Lab 04/10/21 1017 04/10/21 1107 04/11/21 0220 04/12/21 0016 04/13/21 0320 04/14/21 0235  NA 138 138 139 139 138 138  K 4.3 3.8 3.8 3.0* 3.9 4.0  CL 87*  --  90* 89* 94* 97*  CO2 47*  --  43* 42* 40* 36*  GLUCOSE 186*  --  117* 133* 132* 88  BUN 18  --  18 15 15 13   CREATININE 0.44  --  0.53 0.47 0.83 0.50  CALCIUM 8.7*  --  8.5* 8.3* 8.0* 8.0*  MG  --   --  1.8 2.2  --   --   PHOS  --   --  3.1  --   --   --     Liver Function Tests: Recent Labs  Lab 04/10/21 1017 04/11/21 0220 04/12/21 0016 04/14/21 0235  AST 12* 9* 9* 6*  ALT 8 8 8 7   ALKPHOS 35* 28* 30* 29*  BILITOT 0.7 0.8 0.3 0.5  PROT 5.4* 4.7* 4.4* 4.5*  ALBUMIN 2.5* 2.2* 2.1* 2.0*    Coagulation Profile: Recent Labs  Lab 04/10/21 1017  INR 1.1    HbA1C: No results for input(s): HGBA1C in the last 72 hours. CBG: Recent Labs  Lab 04/13/21 0719 04/13/21 1139 04/13/21 1533 04/13/21 2045 04/14/21 0735  GLUCAP 146* 158* 204* 271* 114*     Recent Results (from the past 240 hour(s))  Culture, blood (routine x 2)     Status: None (Preliminary result)   Collection Time: 04/10/21 10:17 AM   Specimen: BLOOD  Result Value Ref Range Status   Specimen Description BLOOD RIGHT ANTECUBITAL  Final   Special Requests   Final    BOTTLES DRAWN AEROBIC AND ANAEROBIC Blood Culture results may not be optimal due to an inadequate volume of blood received in culture bottles   Culture   Final    NO GROWTH 4 DAYS Performed at [facility], [address] [place], [place]    Report Status PENDING  Incomplete  Resp Panel by RT-PCR (Flu A&B, Covid) Nasopharyngeal Swab     Status: None   Collection Time: 04/10/21 10:18 AM   Specimen: Nasopharyngeal Swab; Nasopharyngeal(NP) swabs in vial transport medium  Result Value Ref Range Status   SARS  Coronavirus 2 by RT PCR NEGATIVE NEGATIVE Final    Comment: (NOTE) SARS-CoV-2 target nucleic acids are NOT DETECTED.  The SARS-CoV-2 RNA is generally detectable in upper respiratory specimens during the acute phase of infection. The lowest concentration of SARS-CoV-2 viral copies this assay can detect is 138 copies/mL. A negative result does not preclude SARS-Cov-2 infection and should not be used as the sole basis for treatment or other patient management decisions. A negative result may occur with  improper specimen collection/handling, submission of specimen other than nasopharyngeal swab, presence of viral mutation(s) within the areas targeted by this assay, and inadequate number of viral copies(<138 copies/mL). A negative result must be combined with clinical observations, patient history, and epidemiological information. The expected result is Negative.  Fact Sheet for Patients:  04/12/21  Fact Sheet for Healthcare Providers:  BloggerCourse.com  This test is no t yet approved or cleared by the [employer]ious[employer] FDA and  has been authorized for detection and/or diagnosis of SARS-CoV-2 by FDA under an Emergency Use Authorization (EUA). This EUA will remain  in effect (meaning this test can be used) for the duration of the COVID-19 declaration under Section 564(b)(1) of the Act, 21 U.S.C.section 360bbb-3(b)(1), unless the authorization is terminated  or revoked sooner.       Influenza A by PCR NEGATIVE NEGATIVE Final   Influenza B by PCR NEGATIVE NEGATIVE Final    Comment: (NOTE) The Xpert Xpress SARS-CoV-2/FLU/RSV plus assay is intended as an aid in the diagnosis of influenza from Nasopharyngeal swab specimens and should not be used as a sole basis for treatment. Nasal washings and aspirates are unacceptable for Xpert Xpress SARS-CoV-2/FLU/RSV testing.  Fact Sheet for  Patients: [place]  Fact Sheet for Healthcare Providers: BloggerCourse.com  This test is not yet approved or cleared by the [employer]iousBroker.it FDA and has been authorized for detection and/or diagnosis of SARS-CoV-2 by  FDA under an Emergency Use Authorization (EUA). This EUA will remain in effect (meaning this test can be used) for the duration of the COVID-19 declaration under Section 564(b)(1) of the Act, 21 U.S.C. section 360bbb-3(b)(1), unless the authorization is terminated or revoked.  Performed at [facility], [address], [place], [place] [address]   Culture, blood (routine x 2)     Status: None (Preliminary result)   Collection Time: 04/10/21 10:22 AM   Specimen: BLOOD LEFT HAND  Result Value Ref Range Status   Specimen Description BLOOD LEFT HAND  Final   Special Requests   Final    BOTTLES DRAWN AEROBIC AND ANAEROBIC Blood Culture results may not be optimal due to an inadequate volume of blood received in culture bottles   Culture   Final    NO GROWTH 4 DAYS Performed at [facility], [address], [place], [place] [address]    Report Status PENDING  Incomplete  Urine Culture     Status: None   Collection Time: 04/12/21  2:38 PM   Specimen: Urine, Clean Catch  Result Value Ref Range Status   Specimen Description URINE, CLEAN CATCH  Final   Special Requests NONE  Final   Culture   Final    NO GROWTH Performed at [facility], [address], [place], [place] [address]    Report Status 04/13/2021 FINAL  Final  MRSA Next Gen by PCR, Nasal     Status: Abnormal   Collection Time: 04/12/21  2:38 PM   Specimen: Nasal Mucosa; Nasal Swab  Result Value Ref Range Status   MRSA by PCR Next Gen DETECTED (A) NOT DETECTED Final    Comment: RESULT CALLED TO, READ BACK BY AND VERIFIED WITH: A. LEWIS RN, AT [phone] 04/12/21 D.VANHOOK (NOTE) The GeneXpert MRSA Assay (FDA approved for NASAL specimens  only), is one component of a comprehensive MRSA colonization surveillance program. It is not intended to diagnose MRSA infection nor to guide or monitor treatment for MRSA infections. Test performance is not FDA approved in patients less than 22 years old. Performed at [facility], [address] [place], [place] [address]       Radiology Studies: No results found.   Pamella Pert, MD, PhD [facility]  Between 7 am - 7 pm I am available, please contact me via Amion (for emergencies) or Securechat (non urgent messages)  Between 7 pm - 7 am I am not available, please contact night coverage MD/APP via Amion

## 2021-04-14 NOTE — TOC CAGE-AID Note (Addendum)
Transition of Care [facility]) - CAGE-AID Screening   Patient Details  Name: Becky Hendricks MRN: [id number] Date of Birth: [birth year]  [facility]) CM/SW Contact:    Tom-Johnson, Hershal Coria, RN Phone Number: 04/14/2021, 3:23 PM   Clinical Narrative: CM consulted for substance abuse counseling. Spoke with patient at bedside. Patient was agitated at first but then started making jokes. States she lives alone at home and brother lives 3 hrs away but checks on her. She uses [employer] for transportation. Denies ever drinking alcohol,smoking cigarettes, and doing drugs. States she wants to get better and go home. Denies any TOC needs at this time.   CAGE-AID Screening:    Have You Ever Felt You Ought to Cut Down on Your Drinking or Drug Use?: No (States she never drank alcohol, smoke cigarettes or do drugs.) Have People Annoyed You By [employer] Your Drinking Or Drug Use?: No (States she never drank alcohol, smoke cigarettes or do drugs.) Have You Felt Bad Or Guilty About Your Drinking Or Drug Use?: No (States she never drank alcohol, smoke cigarettes or do drugs.) Have You Ever Had a Drink or Used Drugs First Thing In The Morning to Steady Your Nerves or to Get Rid of a Hangover?: No (States she never drank alcohol, smoke cigarettes or do drugs.) CAGE-AID Score: 0  Substance Abuse Education Offered:  (N/A)  Substance abuse interventions: Other (must comment) (States she never drank alcohol, smoke cigarettes or do drugs.)

## 2021-04-14 NOTE — Consult Note (Signed)
Reason for Consult: Bipolar Disorder w/ inappropriate behavior Referring Physician: Leatha Gilding, MD  Becky Hendricks is an 75 y.o. female.  HPI:  Becky Hendricks is a 75 yr old female who was just discharged from the hospital on 8/26 after being admitted for Sepsis, septic shock, and acute hypoxic and hypercarbic respiratory failure requiring mechanical ventilation. She was discharged on BiPAP QHS but did not use it and was readmitted with AMS, tachypneic and ABG showed a pH of 7.31, PCO2 of 108 and PO2 of 95.   She reports that she is doing well.  She reports that she did not sleep great last night.  She reports that her appetite has been fine she reports no SI, HI, or AVH.  She reports that last night when she went to sleep the BiPAP mask was placed on her and when she awoke she was terrified of it.  She is very firm in her stance that she will not wear the BiPAP mask as it frightens her.  She states she feels like there will not be enough oxygen in it and she will die because of it.  Tried to reassure her of its safety and necessity however she continued to states she would not wear it.  Discussed with her that I would obtain Depakote level and make any medication adjustments if needed.  During our conversation she would occasionally begin talking about other topics.  As I was leaving patient stated that I looked like a "sexy teddy bear".  She reports no other concerns at this time.   Update 11 AM: When patient interviewed again during rounds she was much worse.  She showed significant thought blocking with significant memory issues.  She was much more disorganized in her thinking.    Past Medical History:  Diagnosis Date   Atrial fibrillation (HCC)    Bipolar disorder (HCC)    Bipolar disorder (HCC) 04/27/2020   Clostridium difficile diarrhea 08/02/2019   Dehydration    Essential hypertension    Fall 02/11/2020   Morbid obesity (HCC)    Osteomyelitis (HCC) 12/03/2019   Pneumonia due to  COVID-19 virus 09/15/2019   Type 2 diabetes mellitus (HCC)    Weakness     Past Surgical History:  Procedure Laterality Date   INCISION AND DRAINAGE PERIRECTAL ABSCESS Left 12/04/2019   Procedure: IRRIGATION AND DEBRIDEMENT LEFT BUTTOCK ABSCESS;  Surgeon: Violeta Gelinas, MD;  Location: [facility] OR;  Service: General;  Laterality: Left;   INCISION AND DRAINAGE PERIRECTAL ABSCESS Left 12/10/2019   Procedure: REPEAT IRRIGATION AND DEBRIDEMENT BUTTOCK  ABSCESS;  Surgeon: Abigail Miyamoto, MD;  Location: [facility] OR;  Service: General;  Laterality: Left;   IR FLUORO GUIDE CV LINE RIGHT  11/25/2020   IR RADIOLOGIST EVAL & MGMT  12/29/2020   IR US GUIDE VASC ACCESS RIGHT  11/25/2020    Family History  Family history unknown: Yes    Social History:  reports that she has never smoked. She has never used smokeless tobacco. She reports that she does not currently use alcohol. She reports that she does not currently use drugs.  Allergies:  Allergies  Allergen Reactions   Chlorhexidine Gluconate Itching   Vancomycin     Vancomycin infusion related reaction- May need Vanc to be given at a slower rate    Acetazolamide Er Rash    Medications: I have reviewed the patient's current medications.  Results for orders placed or performed during the hospital encounter of 04/10/21 (from the past 48 hour(s))  Glucose, capillary     Status: Abnormal   Collection Time: 04/12/21 11:29 AM  Result Value Ref Range   Glucose-Capillary 193 (H) 70 - 99 mg/dL    Comment: Glucose reference range applies only to samples taken after fasting for at least 8 hours.  Urine Culture     Status: None   Collection Time: 04/12/21  2:38 PM   Specimen: Urine, Clean Catch  Result Value Ref Range   Specimen Description URINE, CLEAN CATCH    Special Requests NONE    Culture      NO GROWTH Performed at [facility], [address] [place], [place] [address]    Report Status 04/13/2021 FINAL   MRSA Next Gen by PCR, Nasal      Status: Abnormal   Collection Time: 04/12/21  2:38 PM   Specimen: Nasal Mucosa; Nasal Swab  Result Value Ref Range   MRSA by PCR Next Gen DETECTED (A) NOT DETECTED    Comment: RESULT CALLED TO, READ BACK BY AND VERIFIED WITH: A. LEWIS RN, AT [phone] 04/12/21 D.VANHOOK (NOTE) The GeneXpert MRSA Assay (FDA approved for NASAL specimens only), is one component of a comprehensive MRSA colonization surveillance program. It is not intended to diagnose MRSA infection nor to guide or monitor treatment for MRSA infections. Test performance is not FDA approved in patients less than 77 years old. Performed at [facility], [address], [place], [place] [address]   Glucose, capillary     Status: Abnormal   Collection Time: 04/12/21  5:02 PM  Result Value Ref Range   Glucose-Capillary 210 (H) 70 - 99 mg/dL    Comment: Glucose reference range applies only to samples taken after fasting for at least 8 hours.  Glucose, capillary     Status: Abnormal   Collection Time: 04/12/21  9:30 PM  Result Value Ref Range   Glucose-Capillary 227 (H) 70 - 99 mg/dL    Comment: Glucose reference range applies only to samples taken after fasting for at least 8 hours.  Basic metabolic panel     Status: Abnormal   Collection Time: 04/13/21  3:20 AM  Result Value Ref Range   Sodium 138 135 - 145 mmol/L   Potassium 3.9 3.5 - 5.1 mmol/L    Comment: NO VISIBLE HEMOLYSIS   Chloride 94 (L) 98 - 111 mmol/L   CO2 40 (H) 22 - 32 mmol/L   Glucose, Bld 132 (H) 70 - 99 mg/dL    Comment: Glucose reference range applies only to samples taken after fasting for at least 8 hours.   BUN 15 8 - 23 mg/dL   Creatinine, Ser 3.81 0.44 - 1.00 mg/dL   Calcium 8.0 (L) 8.9 - 10.3 mg/dL   GFR, Estimated >01 >75 mL/min    Comment: (NOTE) Calculated using the CKD-EPI Creatinine Equation (2021)    Anion gap 4 (L) 5 - 15    Comment: Performed at [facility], [address], [place], [place] [address]  Glucose, capillary      Status: Abnormal   Collection Time: 04/13/21  7:19 AM  Result Value Ref Range   Glucose-Capillary 146 (H) 70 - 99 mg/dL    Comment: Glucose reference range applies only to samples taken after fasting for at least 8 hours.  Glucose, capillary     Status: Abnormal   Collection Time: 04/13/21 11:39 AM  Result Value Ref Range   Glucose-Capillary 158 (H) 70 - 99 mg/dL  Comment: Glucose reference range applies only to samples taken after fasting for at least 8 hours.  Glucose, capillary     Status: Abnormal   Collection Time: 04/13/21  3:33 PM  Result Value Ref Range   Glucose-Capillary 204 (H) 70 - 99 mg/dL    Comment: Glucose reference range applies only to samples taken after fasting for at least 8 hours.  Glucose, capillary     Status: Abnormal   Collection Time: 04/13/21  8:45 PM  Result Value Ref Range   Glucose-Capillary 271 (H) 70 - 99 mg/dL    Comment: Glucose reference range applies only to samples taken after fasting for at least 8 hours.  Comprehensive metabolic panel     Status: Abnormal   Collection Time: 04/14/21  2:35 AM  Result Value Ref Range   Sodium 138 135 - 145 mmol/L   Potassium 4.0 3.5 - 5.1 mmol/L   Chloride 97 (L) 98 - 111 mmol/L   CO2 36 (H) 22 - 32 mmol/L   Glucose, Bld 88 70 - 99 mg/dL    Comment: Glucose reference range applies only to samples taken after fasting for at least 8 hours.   BUN 13 8 - 23 mg/dL   Creatinine, Ser 1.610.50 0.44 - 1.00 mg/dL   Calcium 8.0 (L) 8.9 - 10.3 mg/dL   Total Protein 4.5 (L) 6.5 - 8.1 g/dL   Albumin 2.0 (L) 3.5 - 5.0 g/dL   AST 6 (L) 15 - 41 U/L   ALT 7 0 - 44 U/L   Alkaline Phosphatase 29 (L) 38 - 126 U/L   Total Bilirubin 0.5 0.3 - 1.2 mg/dL   GFR, Estimated >09>60 >60>60 mL/min    Comment: (NOTE) Calculated using the CKD-EPI Creatinine Equation (2021)    Anion gap 5 5 - 15    Comment: Performed at [facility], [address] [place], [place] [address]  CBC     Status: Abnormal   Collection Time: 04/14/21  2:35  AM  Result Value Ref Range   WBC 11.0 (H) 4.0 - 10.5 K/uL   RBC 3.54 (L) 3.87 - 5.11 MIL/uL   Hemoglobin 10.0 (L) 12.0 - 15.0 g/dL   HCT 81.133.1 (L) 91.436.0 - 78.246.0 %   MCV 93.5 80.0 - 100.0 fL   MCH 28.2 26.0 - 34.0 pg   MCHC 30.2 30.0 - 36.0 g/dL   RDW 95.615.0 21.311.5 - 08.615.5 %   Platelets 146 (L) 150 - 400 K/uL   nRBC 0.2 0.0 - 0.2 %    Comment: Performed at [facility], [address] [place], [place] [address]  Glucose, capillary     Status: Abnormal   Collection Time: 04/14/21  7:35 AM  Result Value Ref Range   Glucose-Capillary 114 (H) 70 - 99 mg/dL    Comment: Glucose reference range applies only to samples taken after fasting for at least 8 hours.    No results found.  Review of Systems  Respiratory:  Negative for cough.   Cardiovascular:  Negative for chest pain.  Gastrointestinal:  Negative for abdominal pain, constipation, diarrhea, nausea and vomiting.  Neurological:  Negative for weakness and headaches.  Psychiatric/Behavioral:  Negative for agitation, hallucinations and suicidal ideas. The patient is not hyperactive.   Blood pressure 107/73, pulse 93, temperature 98.4 F (36.9 C), temperature source Oral, resp. rate (!) 29, weight 98.5 kg, SpO2 99 %. Physical Exam Vitals and nursing note reviewed.  Constitutional:      General:  She is not in acute distress.    Appearance: Normal appearance. She is obese. She is ill-appearing. She is not toxic-appearing.  HENT:     Head: Normocephalic and atraumatic.  Pulmonary:     Comments: On [place] with resp rate of 29 Neurological:     Mental Status: She is alert.      04/14/21 0944  Presentation  General Appearance Appropriate for Environment;Fairly Groomed  Education officer, museum and Coherent;Slow  Speech Volume Normal  Mood and Affect  Mood Dysphoric  Affect Depressed  Thought Processes  Thought Process Disorganized  Descriptions of Associations Tangential  Orientation Partial  Thought Content  Tangential;Paranoid Ideation  Hallucinations None  Ideas of Reference None  Suicidal Thoughts No  Homicidal Thoughts No  Sensorium  Memory Immediate Poor;Recent Poor  Judgment Poor  Insight Poor  Executive Functions  Concentration Fair  Attention Span Fair  Recall Poor  Fund of Knowledge Poor  Language Fair  Psychomotor Activity  Psychomotor Activity Normal  Assets  Assets Resilience;Communication Skills  Sleep  Sleep Good      Assessment/Plan: Case and Plan discussed with Dr. Lucianne Muss  Patient is having some manic symptoms so we will start a midday dose of Risperdal to better help with her inappropriate behaviors.  We will check a Depakote as the last level checked 1 week ago was subtherapeutic, if it continues to be will increase Depakote to 3 times daily dosing.  Given her change from this morning she could be waxing and waning.  Currently her vital signs are stable and urine culture from 8/31 showed no growth ruling out UTI.  Will continue to monitor.   Bipolar Disorder (rule out delirium): -Continue Risperdal M-tabs 2 mg BID -Start Risperdal M-tab 1 mg 2 PM -Continue Depakote 500 mg BID -Obtain Depakote and Ammonia level   Continue rest of care per Primary Team Psychiatry will continue to follow the patient.  Lauro Franklin 04/14/2021, 9:41 AM

## 2021-04-15 LAB — CULTURE, BLOOD (ROUTINE X 2)
Culture: NO GROWTH
Culture: NO GROWTH

## 2021-04-15 LAB — GLUCOSE, CAPILLARY
Glucose-Capillary: 128 mg/dL — ABNORMAL HIGH (ref 70–99)
Glucose-Capillary: 214 mg/dL — ABNORMAL HIGH (ref 70–99)
Glucose-Capillary: 173 mg/dL — ABNORMAL HIGH (ref 70–99)

## 2021-04-15 NOTE — Progress Notes (Signed)
PROGRESS NOTE  Syona Wroblewski WIO:[id number] DOB: 1946/02/04 DOA: 04/10/2021 PCP: Roderic Scarce, MD   LOS: 5 days   Brief Narrative / Interim history: This is a 75 year old female who was just discharged from the hospital on 8/26 after being admitted for Sepsis, septic shock, and acute hypoxic and hypercarbic respiratory failure requiring mechanical ventilation.  She was treated successfully but felt to have significant underlying element of OHS/OSA and was actually discharged on BiPAP at at bedtime with 2 to 3 L of supplemental oxygen during the daytime.  She was initially admitted to the ICU but has been transferred to the hospitalist service on 9/1.  She was altered on admission, minimally responsive, tachypneic and ABG showed a pH of 7.31, PCO2 of 108 and PO2 of 95.  She was placed on NIPPV but did not need to be intubated  Subjective / 24h Interval events: She is upset at me this morning that I do not let her go home, even if she dies if she does over the BiPAP.  She is more withdrawn today  Assessment & Plan: Principal Problem Acute on chronic hypercarbic respiratory failure, secondary to decompensated OHS/OSA, chronically right elevated hemidiaphragm, prior Covid and severe deconditioning -continue to encourage NIPPV, it is clear that if she is not wearing her BiPAP it does not take much for her to go into hypercarbic respiratory failure and decompensated.  Patient tells me she does not wear the BiPAP even if that means she will die, she makes reasonable statements however at times she exhibits symptoms of delirium with waxing and waning of attention, thought blocking per psychiatry, memory issues.  Her brother who knows her well maintains that this is not who she is and she is not at baseline and remains confused, and he, as a Management consultant does not wish for hospice/comfort measures per most recent palliative notes and my discussions with him  Active Problems Acute metabolic encephalopathy  superimposed on underlying bipolar disease -complicated by hypercarbia, impulsive at times, intermittently uncooperative. -Continue risperidone, Depakote, appreciate psychiatry input -Continue to encourage NIPPV -her medical issues seem to be stable currently however her psychiatric issues appear decompensated  History of permanent A. fib-continue Eliquis, continue metoprolol as her blood pressure allows.  She is rate controlled  Chronic diastolic CHF-she appears euvolemic  Essential hypertension-soft blood pressures currently, continue metoprolol as tolerated, hold Norvasc  Complicated UTI-urine cultures without growth, no need for ceftriaxone  Sacral decubitus ulcer, stage I, POA -local wound care  History of type 2 diabetes mellitus-Continue sliding scale  CBG (last 3)  Recent Labs    04/14/21 1501 04/14/21 2053 04/15/21 0656  GLUCAP 183* 179* 128*     Scheduled Meds:  apixaban  5 mg Oral BID   atorvastatin  10 mg Oral Daily   budesonide (PULMICORT) nebulizer solution  0.5 mg Nebulization BID   divalproex  500 mg Oral BID   Gerhardt's butt cream   Topical BID   insulin aspart  0-15 Units Subcutaneous TID AC & HS   metoprolol tartrate  12.5 mg Oral Q6H   pantoprazole  40 mg Oral Daily   risperiDONE  1 mg Oral Daily   risperiDONE  2 mg Oral BID   tamsulosin  0.4 mg Oral QODAY   Continuous Infusions:  sodium chloride 10 mL/hr at 04/10/21 1318   sodium chloride Stopped (04/12/21 0825)   PRN Meds:.haloperidol lactate, ipratropium-albuterol, loperamide  Diet Orders (From admission, onward)     Start     Ordered  04/12/21 0807  Diet Carb Modified Fluid consistency: Thin; Room service appropriate? Yes  Diet effective now       Question Answer Comment  Diet-HS Snack? Nothing   Calorie Level Medium 1600-2000   Fluid consistency: Thin   Room service appropriate? Yes      04/12/21 0807            DVT prophylaxis:  apixaban (ELIQUIS) tablet 5 mg     Code  Status: Full Code  Family Communication: Called and updated brother [name] over the phone  Status is: Inpatient  Remains inpatient appropriate because:Inpatient level of care appropriate due to severity of illness  Dispo: The patient is from: SNF              Anticipated d/c is to: SNF              Patient currently is not medically stable to d/c.   Difficult to place patient No   Level of care: Progressive  Consultants:  PCCM Palliative care  Procedures:  none  Microbiology  Cultures - pending  Antimicrobials: Ceftriaxone 8/31 >>    Objective: Vitals:   04/14/21 2335 04/15/21 0343 04/15/21 0924 04/15/21 1129  BP: 103/73 (!) 108/97 99/67 (!) 103/54  Pulse: 75 89 61 (!) 56  Resp: (!) 24 18  19   Temp: 98 F (36.7 C)   98.8 F (37.1 C)  TempSrc: Oral   Oral  SpO2: 99% 100%  98%  Weight: 106.6 kg     Height: 5\' 6"  (1.676 m)       Intake/Output Summary (Last 24 hours) at 04/15/2021 1327 Last data filed at 04/15/2021 1237 Gross per 24 hour  Intake 1256 ml  Output 620 ml  Net 636 ml    Filed Weights   04/13/21 0500 04/14/21 0407 04/14/21 2335  Weight: 101.7 kg 98.5 kg 106.6 kg    Examination:  Constitutional: NAD, in bed Eyes: Anicteric ENMT: mmm  Neck: normal, supple Respiratory: Clear to auscultation bilaterally, no wheezing, no crackles Cardiovascular: Regular rate and rhythm, no murmurs, no peripheral edema Abdomen: Soft, nontender, nondistended, bowel sounds positive Musculoskeletal: no clubbing / cyanosis.  Skin: No rashes seen Neurologic: Nonfocal, equal strength  Data Reviewed: I have independently reviewed following labs and imaging studies   CBC: Recent Labs  Lab 04/10/21 1017 04/10/21 1107 04/11/21 0220 04/12/21 0016 04/14/21 0235 04/14/21 1149  WBC 19.1*  --  11.8* 10.4 11.0*  --   NEUTROABS 13.4*  --   --  5.0  --   --   HGB 11.9* 12.6 10.7* 10.6* 10.0* 10.5*  HCT 39.7 37.0 34.9* 34.0* 33.1* 31.0*  MCV 95.7  --  91.8 90.9 93.5   --   PLT 190  --  180 160 146*  --     Basic Metabolic Panel: Recent Labs  Lab 04/10/21 1017 04/10/21 1107 04/11/21 0220 04/12/21 0016 04/13/21 0320 04/14/21 0235 04/14/21 1149  NA 138   < > 139 139 138 138 138  K 4.3   < > 3.8 3.0* 3.9 4.0 4.5  CL 87*  --  90* 89* 94* 97*  --   CO2 47*  --  43* 42* 40* 36*  --   GLUCOSE 186*  --  117* 133* 132* 88  --   BUN 18  --  18 15 15 13   --   CREATININE 0.44  --  0.53 0.47 0.83 0.50  --   CALCIUM 8.7*  --  8.5* 8.3*  8.0* 8.0*  --   MG  --   --  1.8 2.2  --   --   --   PHOS  --   --  3.1  --   --   --   --    < > = values in this interval not displayed.    Liver Function Tests: Recent Labs  Lab 04/10/21 1017 04/11/21 0220 04/12/21 0016 04/14/21 0235  AST 12* 9* 9* 6*  ALT 8 8 8 7   ALKPHOS 35* 28* 30* 29*  BILITOT 0.7 0.8 0.3 0.5  PROT 5.4* 4.7* 4.4* 4.5*  ALBUMIN 2.5* 2.2* 2.1* 2.0*    Coagulation Profile: Recent Labs  Lab 04/10/21 1017  INR 1.1    HbA1C: No results for input(s): HGBA1C in the last 72 hours. CBG: Recent Labs  Lab 04/14/21 0735 04/14/21 1128 04/14/21 1501 04/14/21 2053 04/15/21 0656  GLUCAP 114* 170* 183* 179* 128*     Recent Results (from the past 240 hour(s))  Culture, blood (routine x 2)     Status: None   Collection Time: 04/10/21 10:17 AM   Specimen: BLOOD  Result Value Ref Range Status   Specimen Description BLOOD RIGHT ANTECUBITAL  Final   Special Requests   Final    BOTTLES DRAWN AEROBIC AND ANAEROBIC Blood Culture results may not be optimal due to an inadequate volume of blood received in culture bottles   Culture   Final    NO GROWTH 5 DAYS Performed at [facility], [address] [place], [place]    Report Status 04/15/2021 FINAL  Final  Resp Panel by RT-PCR (Flu A&B, Covid) Nasopharyngeal Swab     Status: None   Collection Time: 04/10/21 10:18 AM   Specimen: Nasopharyngeal Swab; Nasopharyngeal(NP) swabs in vial transport medium  Result Value Ref Range  Status   SARS Coronavirus 2 by RT PCR NEGATIVE NEGATIVE Final    Comment: (NOTE) SARS-CoV-2 target nucleic acids are NOT DETECTED.  The SARS-CoV-2 RNA is generally detectable in upper respiratory specimens during the acute phase of infection. The lowest concentration of SARS-CoV-2 viral copies this assay can detect is 138 copies/mL. A negative result does not preclude SARS-Cov-2 infection and should not be used as the sole basis for treatment or other patient management decisions. A negative result may occur with  improper specimen collection/handling, submission of specimen other than nasopharyngeal swab, presence of viral mutation(s) within the areas targeted by this assay, and inadequate number of viral copies(<138 copies/mL). A negative result must be combined with clinical observations, patient history, and epidemiological information. The expected result is Negative.  Fact Sheet for Patients:  04/12/21  Fact Sheet for Healthcare Providers:  BloggerCourse.com  This test is no t yet approved or cleared by the [place] FDA and  has been authorized for detection and/or diagnosis of SARS-CoV-2 by FDA under an Emergency Use Authorization (EUA). This EUA will remain  in effect (meaning this test can be used) for the duration of the COVID-19 declaration under Section 564(b)(1) of the Act, 21 U.S.C.section 360bbb-3(b)(1), unless the authorization is terminated  or revoked sooner.       Influenza A by PCR NEGATIVE NEGATIVE Final   Influenza B by PCR NEGATIVE NEGATIVE Final    Comment: (NOTE) The Xpert Xpress SARS-CoV-2/FLU/RSV plus assay is intended as an aid in the diagnosis of influenza from Nasopharyngeal swab specimens and should not be used as a sole basis for treatment. Nasal washings and aspirates are  unacceptable for Xpert Xpress SARS-CoV-2/FLU/RSV testing.  Fact Sheet for  Patients: BloggerCourse.comhttps://www.fda.gov/media/152166/download  Fact Sheet for Healthcare Providers: SeriousBroker.ithttps://www.fda.gov/media/152162/download  This test is not yet approved or cleared by the [place] FDA and has been authorized for detection and/or diagnosis of SARS-CoV-2 by FDA under an Emergency Use Authorization (EUA). This EUA will remain in effect (meaning this test can be used) for the duration of the COVID-19 declaration under Section 564(b)(1) of the Act, 21 U.S.C. section 360bbb-3(b)(1), unless the authorization is terminated or revoked.  Performed at [facility], [address] [place], [place] [address]   Culture, blood (routine x 2)     Status: None   Collection Time: 04/10/21 10:22 AM   Specimen: BLOOD LEFT HAND  Result Value Ref Range Status   Specimen Description BLOOD LEFT HAND  Final   Special Requests   Final    BOTTLES DRAWN AEROBIC AND ANAEROBIC Blood Culture results may not be optimal due to an inadequate volume of blood received in culture bottles   Culture   Final    NO GROWTH 5 DAYS Performed at [facility], [address] [place], [place] [address]    Report Status 04/15/2021 FINAL  Final  Urine Culture     Status: None   Collection Time: 04/12/21  2:38 PM   Specimen: Urine, Clean Catch  Result Value Ref Range Status   Specimen Description URINE, CLEAN CATCH  Final   Special Requests NONE  Final   Culture   Final    NO GROWTH Performed at [facility], [address] [place], [place] [address]    Report Status 04/13/2021 FINAL  Final  MRSA Next Gen by PCR, Nasal     Status: Abnormal   Collection Time: 04/12/21  2:38 PM   Specimen: Nasal Mucosa; Nasal Swab  Result Value Ref Range Status   MRSA by PCR Next Gen DETECTED (A) NOT DETECTED Final    Comment: RESULT CALLED TO, READ BACK BY AND VERIFIED WITH: A. LEWIS RN, AT [phone] 04/12/21 D.VANHOOK (NOTE) The GeneXpert MRSA Assay (FDA approved for NASAL specimens only), is one  component of a comprehensive MRSA colonization surveillance program. It is not intended to diagnose MRSA infection nor to guide or monitor treatment for MRSA infections. Test performance is not FDA approved in patients less than 454 years old. Performed at [facility], [address] [place], [place] [address]       Radiology Studies: No results found.   Pamella Pertostin Mesa Janus, MD, PhD [facility]  Between 7 am - 7 pm I am available, please contact me via Amion (for emergencies) or Securechat (non urgent messages)  Between 7 pm - 7 am I am not available, please contact night coverage MD/APP via Amion

## 2021-04-15 NOTE — Progress Notes (Signed)
Pt refused BiPAP.

## 2021-04-15 NOTE — Progress Notes (Signed)
Discrepancy between previous weight and weight obtained by equipment on transfer unit. See flowsheet for details.

## 2021-04-15 NOTE — Progress Notes (Signed)
Patient converted to sinus rhythm. Confirmed with 12 lead and attending MD notified at bedside

## 2021-04-16 LAB — BASIC METABOLIC PANEL
Anion gap: 4 — ABNORMAL LOW (ref 5–15)
BUN: 11 mg/dL (ref 8–23)
CO2: 38 mmol/L — ABNORMAL HIGH (ref 22–32)
Calcium: 8.1 mg/dL — ABNORMAL LOW (ref 8.9–10.3)
Chloride: 96 mmol/L — ABNORMAL LOW (ref 98–111)
Creatinine, Ser: 0.64 mg/dL (ref 0.44–1.00)
GFR, Estimated: >60 mL/min (ref >60)
Glucose, Bld: 116 mg/dL — ABNORMAL HIGH (ref 70–99)
Potassium: 4.8 mmol/L (ref 3.5–5.1)
Sodium: 138 mmol/L (ref 135–145)

## 2021-04-16 LAB — CBC
HCT: 31.9 % — ABNORMAL LOW (ref 36.0–46.0)
Hemoglobin: 9.3 g/dL — ABNORMAL LOW (ref 12.0–15.0)
MCH: 27.8 pg (ref 26.0–34.0)
MCHC: 29.2 g/dL — ABNORMAL LOW (ref 30.0–36.0)
MCV: 95.5 fL (ref 80.0–100.0)
Platelets: 133 10*3/uL — ABNORMAL LOW (ref 150–400)
RBC: 3.34 MIL/uL — ABNORMAL LOW (ref 3.87–5.11)
RDW: 15 % (ref 11.5–15.5)
WBC: 9.1 10*3/uL (ref 4.0–10.5)
nRBC: 0 % (ref 0.0–0.2)

## 2021-04-16 LAB — GLUCOSE, CAPILLARY
Glucose-Capillary: 103 mg/dL — ABNORMAL HIGH (ref 70–99)
Glucose-Capillary: 163 mg/dL — ABNORMAL HIGH (ref 70–99)
Glucose-Capillary: 149 mg/dL — ABNORMAL HIGH (ref 70–99)
Glucose-Capillary: 184 mg/dL — ABNORMAL HIGH (ref 70–99)

## 2021-04-16 MED ORDER — DIVALPROEX SODIUM 250 MG PO DR TAB
750.0000 mg | DELAYED_RELEASE_TABLET | Freq: Two times a day (BID) | ORAL | Status: DC
  Administered 2021-04-17 – 2021-04-19 (×6): 750 mg via ORAL
  Filled 2021-04-16 (×6): qty 3

## 2021-04-16 MED ORDER — DIVALPROEX SODIUM 250 MG PO DR TAB
500.0000 mg | DELAYED_RELEASE_TABLET | Freq: Once | ORAL | Status: AC
  Administered 2021-04-16: 500 mg via ORAL
  Filled 2021-04-16: qty 2

## 2021-04-16 NOTE — Progress Notes (Signed)
Pt not in need of BIPAP at this time, order is PRN at this time. Pt respiratory status is stable on [place] II 2 Lpm w/no distress noted at this time. RT will continue to monitor.

## 2021-04-16 NOTE — Plan of Care (Signed)
  Problem: Pain Managment: Goal: General experience of comfort will improve Outcome: Completed/Met

## 2021-04-16 NOTE — Progress Notes (Addendum)
PROGRESS NOTE  Becky Hendricks DGL:[id number] DOB: 03/12/1946 DOA: 04/10/2021 PCP: Roderic ScarceHartman, Lisa, MD   LOS: 6 days   Brief Narrative / Interim history: This is a 75 year old female who was just discharged from the hospital on 8/26 after being admitted for Sepsis, septic shock, and acute hypoxic and hypercarbic respiratory failure requiring mechanical ventilation.  She was treated successfully but felt to have significant underlying element of OHS/OSA and was actually discharged on BiPAP at at bedtime with 2 to 3 L of supplemental oxygen during the daytime.  She was initially admitted to the ICU but has been transferred to the hospitalist service on 9/1.  She was altered on admission, minimally responsive, tachypneic and ABG showed a pH of 7.31, PCO2 of 108 and PO2 of 95.  She was placed on NIPPV but did not need to be intubated  Subjective / 24h Interval events: Sleeping this morning, did not wake up.  Per RN intermittently confused  Assessment & Plan: Principal Problem Acute on chronic hypercarbic respiratory failure, secondary to decompensated OHS/OSA, chronically right elevated hemidiaphragm, prior Covid and severe deconditioning -continue to encourage NIPPV, it is clear that if she is not wearing her BiPAP it does not take much for her to go into hypercarbic respiratory failure and decompensated.  Patient tells me she does not wear the BiPAP even if that means she will die, she makes reasonable statements however at times she exhibits symptoms of delirium with waxing and waning of attention, thought blocking per psychiatry, memory issues.  Her brother who knows her well maintains that this is not who she is and she is not at baseline and remains confused, and he, as a Management consultantdecision maker does not wish for hospice/comfort measures per most recent palliative notes and my discussions with him  Active Problems Acute metabolic encephalopathy superimposed on underlying bipolar disease -complicated by  hypercarbia, impulsive at times, intermittently uncooperative. -Continue risperidone, Depakote, appreciate psychiatry input -Continue to encourage NIPPV -her medical issues seem to be stable currently however her psychiatric issues appear decompensated -Her Depakote level was low, I increased the dose, appreciate psychiatric input  History of permanent A. fib-continue Eliquis, continue metoprolol as her blood pressure allows.  She is rate controlled  Chronic diastolic CHF-she appears euvolemic  Essential hypertension-soft blood pressures currently, continue metoprolol as tolerated, hold Norvasc  Complicated UTI-urine cultures without growth, no need for ceftriaxone  Sacral decubitus ulcer, stage I, POA -local wound care  History of type 2 diabetes mellitus-Continue sliding scale  CBG (last 3)  Recent Labs    04/15/21 2116 04/16/21 0551 04/16/21 1101  GLUCAP 173* 103* 163*     Scheduled Meds:  apixaban  5 mg Oral BID   atorvastatin  10 mg Oral Daily   budesonide (PULMICORT) nebulizer solution  0.5 mg Nebulization BID   divalproex  500 mg Oral Once   [START ON 04/17/2021] divalproex  750 mg Oral BID   Gerhardt's butt cream   Topical BID   insulin aspart  0-15 Units Subcutaneous TID AC & HS   metoprolol tartrate  12.5 mg Oral Q6H   pantoprazole  40 mg Oral Daily   risperiDONE  1 mg Oral Daily   risperiDONE  2 mg Oral BID   tamsulosin  0.4 mg Oral QODAY   Continuous Infusions:  sodium chloride 10 mL/hr at 04/10/21 1318   sodium chloride Stopped (04/12/21 0825)   PRN Meds:.haloperidol lactate, ipratropium-albuterol, loperamide  Diet Orders (From admission, onward)     Start  Ordered   04/12/21 0807  Diet Carb Modified Fluid consistency: Thin; Room service appropriate? Yes  Diet effective now       Question Answer Comment  Diet-HS Snack? Nothing   Calorie Level Medium 1600-2000   Fluid consistency: Thin   Room service appropriate? Yes      04/12/21 0807             DVT prophylaxis:  apixaban (ELIQUIS) tablet 5 mg     Code Status: Full Code  Family Communication: Called and updated brother [name] over the phone  Status is: Inpatient  Remains inpatient appropriate because:Inpatient level of care appropriate due to severity of illness  Dispo: The patient is from: SNF              Anticipated d/c is to: SNF              Patient currently is not medically stable to d/c.   Difficult to place patient No   Level of care: Progressive  Consultants:  PCCM Palliative care  Procedures:  none  Microbiology  Cultures - negative  Antimicrobials: none   Objective: Vitals:   04/16/21 0447 04/16/21 0555 04/16/21 0820 04/16/21 0904  BP: (!) 103/49 (!) 88/49  (!) 88/60  Pulse: (!) 51 (!) 52  (!) 52  Resp: 16 18  18   Temp: 98.6 F (37 C)   98.2 F (36.8 C)  TempSrc: Oral   Oral  SpO2: 100%  96% 100%  Weight: 105.5 kg     Height:        Intake/Output Summary (Last 24 hours) at 04/16/2021 1139 Last data filed at 04/16/2021 0649 Gross per 24 hour  Intake 327 ml  Output 1050 ml  Net -723 ml    Filed Weights   04/14/21 0407 04/14/21 2335 04/16/21 0447  Weight: 98.5 kg 106.6 kg 105.5 kg    Examination: Constitutional: NAD ENMT: mmm Neck: normal, supple Respiratory: clear to auscultation bilaterally, no wheezing, no crackles. Normal respiratory effort.  Cardiovascular: Regular rate and rhythm, no murmurs / rubs / gallops. No LE edema. Abdomen: soft, no distention, no tenderness. Bowel sounds positive.  Skin: no rashes Neurologic: non focal   Data Reviewed: I have independently reviewed following labs and imaging studies   CBC: Recent Labs  Lab 04/10/21 1017 04/10/21 1107 04/11/21 0220 04/12/21 0016 04/14/21 0235 04/14/21 1149 04/16/21 0225  WBC 19.1*  --  11.8* 10.4 11.0*  --  9.1  NEUTROABS 13.4*  --   --  5.0  --   --   --   HGB 11.9*   < > 10.7* 10.6* 10.0* 10.5* 9.3*  HCT 39.7   < > 34.9* 34.0* 33.1* 31.0*  31.9*  MCV 95.7  --  91.8 90.9 93.5  --  95.5  PLT 190  --  180 160 146*  --  133*   < > = values in this interval not displayed.    Basic Metabolic Panel: Recent Labs  Lab 04/11/21 0220 04/12/21 0016 04/13/21 0320 04/14/21 0235 04/14/21 1149 04/16/21 0225  NA 139 139 138 138 138 138  K 3.8 3.0* 3.9 4.0 4.5 4.8  CL 90* 89* 94* 97*  --  96*  CO2 43* 42* 40* 36*  --  38*  GLUCOSE 117* 133* 132* 88  --  116*  BUN 18 15 15 13   --  11  CREATININE 0.53 0.47 0.83 0.50  --  0.64  CALCIUM 8.5* 8.3* 8.0* 8.0*  --  8.1*  MG 1.8 2.2  --   --   --   --   PHOS 3.1  --   --   --   --   --     Liver Function Tests: Recent Labs  Lab 04/10/21 1017 04/11/21 0220 04/12/21 0016 04/14/21 0235  AST 12* 9* 9* 6*  ALT 8 8 8 7   ALKPHOS 35* 28* 30* 29*  BILITOT 0.7 0.8 0.3 0.5  PROT 5.4* 4.7* 4.4* 4.5*  ALBUMIN 2.5* 2.2* 2.1* 2.0*    Coagulation Profile: Recent Labs  Lab 04/10/21 1017  INR 1.1    HbA1C: No results for input(s): HGBA1C in the last 72 hours. CBG: Recent Labs  Lab 04/15/21 0656 04/15/21 1641 04/15/21 2116 04/16/21 0551 04/16/21 1101  GLUCAP 128* 214* 173* 103* 163*     Recent Results (from the past 240 hour(s))  Culture, blood (routine x 2)     Status: None   Collection Time: 04/10/21 10:17 AM   Specimen: BLOOD  Result Value Ref Range Status   Specimen Description BLOOD RIGHT ANTECUBITAL  Final   Special Requests   Final    BOTTLES DRAWN AEROBIC AND ANAEROBIC Blood Culture results may not be optimal due to an inadequate volume of blood received in culture bottles   Culture   Final    NO GROWTH 5 DAYS Performed at [facility], [address], [place], [place]    Report Status 04/15/2021 FINAL  Final  Resp Panel by RT-PCR (Flu A&B, Covid) Nasopharyngeal Swab     Status: None   Collection Time: 04/10/21 10:18 AM   Specimen: Nasopharyngeal Swab; Nasopharyngeal(NP) swabs in vial transport medium  Result Value Ref Range Status   SARS  Coronavirus 2 by RT PCR NEGATIVE NEGATIVE Final    Comment: (NOTE) SARS-CoV-2 target nucleic acids are NOT DETECTED.  The SARS-CoV-2 RNA is generally detectable in upper respiratory specimens during the acute phase of infection. The lowest concentration of SARS-CoV-2 viral copies this assay can detect is 138 copies/mL. A negative result does not preclude SARS-Cov-2 infection and should not be used as the sole basis for treatment or other patient management decisions. A negative result may occur with  improper specimen collection/handling, submission of specimen other than nasopharyngeal swab, presence of viral mutation(s) within the areas targeted by this assay, and inadequate number of viral copies(<138 copies/mL). A negative result must be combined with clinical observations, patient history, and epidemiological information. The expected result is Negative.  Fact Sheet for Patients:  04/12/21  Fact Sheet for Healthcare Providers:  BloggerCourse.com  This test is no t yet approved or cleared by the [employer] FDA and  has been authorized for detection and/or diagnosis of SARS-CoV-2 by FDA under an Emergency Use Authorization (EUA). This EUA will remain  in effect (meaning this test can be used) for the duration of the COVID-19 declaration under Section 564(b)(1) of the Act, 21 U.S.C.section 360bbb-3(b)(1), unless the authorization is terminated  or revoked sooner.       Influenza A by PCR NEGATIVE NEGATIVE Final   Influenza B by PCR NEGATIVE NEGATIVE Final    Comment: (NOTE) The Xpert Xpress SARS-CoV-2/FLU/RSV plus assay is intended as an aid in the diagnosis of influenza from Nasopharyngeal swab specimens and should not be used as a sole basis for treatment. Nasal washings and aspirates are unacceptable for Xpert Xpress SARS-CoV-2/FLU/RSV testing.  Fact Sheet for  Patients: [place]  Fact Sheet for Healthcare Providers: BloggerCourse.com  This test is not yet approved or cleared by the [place] and has been authorized for detection and/or diagnosis of SARS-CoV-2 by FDA under an Emergency Use Authorization (EUA). This EUA will remain in effect (meaning this test can be used) for the duration of the COVID-19 declaration under Section 564(b)(1) of the Act, 21 U.S.C. section 360bbb-3(b)(1), unless the authorization is terminated or revoked.  Performed at [facility], [address] [place], [place] [address]   Culture, blood (routine x 2)     Status: None   Collection Time: 04/10/21 10:22 AM   Specimen: BLOOD LEFT HAND  Result Value Ref Range Status   Specimen Description BLOOD LEFT HAND  Final   Special Requests   Final    BOTTLES DRAWN AEROBIC AND ANAEROBIC Blood Culture results may not be optimal due to an inadequate volume of blood received in culture bottles   Culture   Final    NO GROWTH 5 DAYS Performed at [facility], [address] [place], [place] [address]    Report Status 04/15/2021 FINAL  Final  Urine Culture     Status: None   Collection Time: 04/12/21  2:38 PM   Specimen: Urine, Clean Catch  Result Value Ref Range Status   Specimen Description URINE, CLEAN CATCH  Final   Special Requests NONE  Final   Culture   Final    NO GROWTH Performed at [facility], [address] [place], [place] [address]    Report Status 04/13/2021 FINAL  Final  MRSA Next Gen by PCR, Nasal     Status: Abnormal   Collection Time: 04/12/21  2:38 PM   Specimen: Nasal Mucosa; Nasal Swab  Result Value Ref Range Status   MRSA by PCR Next Gen DETECTED (A) NOT DETECTED Final    Comment: RESULT CALLED TO, READ BACK BY AND VERIFIED WITH: A. LEWIS RN, AT [phone] 04/12/21 D.VANHOOK (NOTE) The GeneXpert MRSA Assay (FDA approved for NASAL specimens only), is one  component of a comprehensive MRSA colonization surveillance program. It is not intended to diagnose MRSA infection nor to guide or monitor treatment for MRSA infections. Test performance is not FDA approved in patients less than 24 years old. Performed at [facility], [address], [place], [place] [address]       Radiology Studies: No results found.   Pamella Pert, MD, PhD [facility]  Between 7 am - 7 pm I am available, please contact me via Amion (for emergencies) or Securechat (non urgent messages)  Between 7 pm - 7 am I am not available, please contact night coverage MD/APP via Amion

## 2021-04-17 LAB — GLUCOSE, CAPILLARY
Glucose-Capillary: 117 mg/dL — ABNORMAL HIGH (ref 70–99)
Glucose-Capillary: 152 mg/dL — ABNORMAL HIGH (ref 70–99)
Glucose-Capillary: 160 mg/dL — ABNORMAL HIGH (ref 70–99)
Glucose-Capillary: 139 mg/dL — ABNORMAL HIGH (ref 70–99)

## 2021-04-17 NOTE — Progress Notes (Signed)
Pt states she does not want to wear bipap currently, pt is resting comfortably. RT will cont to monitor, RN aware.

## 2021-04-17 NOTE — Care Management Important Message (Signed)
Important Message  Patient Details  Name: Becky Hendricks MRN: [id number] Date of Birth: 06-Oct-1945   Medicare Important Message Given:  Yes     Renie Ora 04/17/2021, 8:41 AM

## 2021-04-17 NOTE — Progress Notes (Addendum)
PROGRESS NOTE  Becky Hendricks MVH:[id number] DOB: 02/18/1946 DOA: 04/10/2021 PCP: Roderic ScarceHartman, Lisa, MD   LOS: 7 days   Brief Narrative / Interim history: This is a 75 year old female who was just discharged from the hospital on 8/26 after being admitted for Sepsis, septic shock, and acute hypoxic and hypercarbic respiratory failure requiring mechanical ventilation.  She was treated successfully but felt to have significant underlying element of OHS/OSA and was actually discharged on BiPAP at at bedtime with 2 to 3 L of supplemental oxygen during the daytime.  She was initially admitted to the ICU but has been transferred to the hospitalist service on 9/1.  She was altered on admission, minimally responsive, tachypneic and ABG showed a pH of 7.31, PCO2 of 108 and PO2 of 95.  She was placed on NIPPV but did not need to be intubated  Subjective / 24h Interval events: Seems sad this morning, she tells me she wants to go back to her nursing home and she does not want the BiPAP on.  Assessment & Plan: Principal Problem Acute on chronic hypercarbic respiratory failure, secondary to decompensated OHS/OSA, chronically right elevated hemidiaphragm, prior Covid and severe deconditioning -continue to encourage NIPPV, it is clear that if she is not wearing her BiPAP it does not take much for her to go into hypercarbic respiratory failure and decompensate.  Patient tells me she does not wear the BiPAP even if that means she will die, she makes reasonable statements however at times she exhibits symptoms of delirium with waxing and waning of attention, thought blocking per psychiatry, memory issues.  Her brother who knows her well maintains that this is not who she is and she is not at baseline and remains confused, and he, as a Management consultantdecision maker does not wish for hospice/comfort measures per most recent palliative notes and my discussions with him -Again I discussed with the brother today over the phone, he mentions that  when she was in the ICU, in the presence of the rounding MD she told everybody to do everything possible to keep her alive  Active Problems Acute metabolic encephalopathy superimposed on underlying bipolar disease -complicated by hypercarbia, impulsive at times, intermittently uncooperative. -Continue risperidone, Depakote, appreciate psychiatry input -Continue to encourage NIPPV -her medical issues seem to be stable currently however her psychiatric issues appear decompensated.  Per brother, at baseline when her psychiatric condition is controlled she is agreeable to wear the BiPAP mask -Her Depakote level was low, I increased the dose, appreciate psychiatric input.  Risperidone has been increased by psychiatry 3 days ago  History of permanent A. fib-continue Eliquis, continue metoprolol as her blood pressure allows.  She is rate controlled  Chronic diastolic CHF-she appears euvolemic  Essential hypertension-soft blood pressures currently, continue metoprolol as tolerated, hold Norvasc  Complicated UTI-urine cultures without growth, no need for ceftriaxone  Sacral decubitus ulcer, stage I, POA -local wound care  History of type 2 diabetes mellitus-Continue sliding scale  CBG (last 3)  Recent Labs    04/17/21 0554 04/17/21 1058 04/17/21 1541  GLUCAP 117* 152* 160*     Scheduled Meds:  apixaban  5 mg Oral BID   atorvastatin  10 mg Oral Daily   budesonide (PULMICORT) nebulizer solution  0.5 mg Nebulization BID   divalproex  750 mg Oral BID   Gerhardt's butt cream   Topical BID   insulin aspart  0-15 Units Subcutaneous TID AC & HS   metoprolol tartrate  12.5 mg Oral Q6H   pantoprazole  40 mg Oral Daily   risperiDONE  1 mg Oral Daily   risperiDONE  2 mg Oral BID   tamsulosin  0.4 mg Oral QODAY   Continuous Infusions:  sodium chloride 10 mL/hr at 04/10/21 1318   sodium chloride Stopped (04/12/21 0825)   PRN Meds:.haloperidol lactate, ipratropium-albuterol,  loperamide  Diet Orders (From admission, onward)     Start     Ordered   04/12/21 0807  Diet Carb Modified Fluid consistency: Thin; Room service appropriate? Yes  Diet effective now       Question Answer Comment  Diet-HS Snack? Nothing   Calorie Level Medium 1600-2000   Fluid consistency: Thin   Room service appropriate? Yes      04/12/21 0807            DVT prophylaxis:  apixaban (ELIQUIS) tablet 5 mg     Code Status: Full Code  Family Communication: Called and updated brother [name] over the phone  Status is: Inpatient  Remains inpatient appropriate because:Inpatient level of care appropriate due to severity of illness  Dispo: The patient is from: SNF              Anticipated d/c is to: SNF              Patient currently is not medically stable to d/c.   Difficult to place patient No   Level of care: Progressive  Consultants:  PCCM Palliative care  Procedures:  none  Microbiology  Cultures - negative  Antimicrobials: none   Objective: Vitals:   04/17/21 0301 04/17/21 0530 04/17/21 0740 04/17/21 1123  BP: (!) 115/51 (!) 112/56 (!) 111/56 109/72  Pulse: (!) 59 (!) 49 (!) 56 (!) 58  Resp: 15     Temp: 99.1 F (37.3 C)  99 F (37.2 C) 98.2 F (36.8 C)  TempSrc: Oral  Oral Oral  SpO2: 99%  97% 99%  Weight:      Height:        Intake/Output Summary (Last 24 hours) at 04/17/2021 1547 Last data filed at 04/17/2021 1152 Gross per 24 hour  Intake 1180 ml  Output 1375 ml  Net -195 ml    Filed Weights   04/14/21 2335 04/16/21 0447 04/17/21 0110  Weight: 106.6 kg 105.5 kg 107.2 kg    Examination: Constitutional: She is in no distress ENMT: Moist mucous membranes Neck: normal, supple Respiratory: Lungs are clear bilaterally, no wheezing, no crackles, normal respiratory effort Cardiovascular: Regular rate and rhythm, no murmurs, no peripheral edema Abdomen: Soft, NT, ND, bowel sounds positive Skin: no rashes seen Neurologic: No focal  deficits  Data Reviewed: I have independently reviewed following labs and imaging studies   CBC: Recent Labs  Lab 04/11/21 0220 04/12/21 0016 04/14/21 0235 04/14/21 1149 04/16/21 0225  WBC 11.8* 10.4 11.0*  --  9.1  NEUTROABS  --  5.0  --   --   --   HGB 10.7* 10.6* 10.0* 10.5* 9.3*  HCT 34.9* 34.0* 33.1* 31.0* 31.9*  MCV 91.8 90.9 93.5  --  95.5  PLT 180 160 146*  --  133*    Basic Metabolic Panel: Recent Labs  Lab 04/11/21 0220 04/12/21 0016 04/13/21 0320 04/14/21 0235 04/14/21 1149 04/16/21 0225  NA 139 139 138 138 138 138  K 3.8 3.0* 3.9 4.0 4.5 4.8  CL 90* 89* 94* 97*  --  96*  CO2 43* 42* 40* 36*  --  38*  GLUCOSE 117* 133* 132* 88  --  116*  BUN 18 15 15 13   --  11  CREATININE 0.53 0.47 0.83 0.50  --  0.64  CALCIUM 8.5* 8.3* 8.0* 8.0*  --  8.1*  MG 1.8 2.2  --   --   --   --   PHOS 3.1  --   --   --   --   --     Liver Function Tests: Recent Labs  Lab 04/11/21 0220 04/12/21 0016 04/14/21 0235  AST 9* 9* 6*  ALT 8 8 7   ALKPHOS 28* 30* 29*  BILITOT 0.8 0.3 0.5  PROT 4.7* 4.4* 4.5*  ALBUMIN 2.2* 2.1* 2.0*    Coagulation Profile: No results for input(s): INR, PROTIME in the last 168 hours.  HbA1C: No results for input(s): HGBA1C in the last 72 hours. CBG: Recent Labs  Lab 04/16/21 1609 04/16/21 2108 04/17/21 0554 04/17/21 1058 04/17/21 1541  GLUCAP 149* 184* 117* 152* 160*     Recent Results (from the past 240 hour(s))  Culture, blood (routine x 2)     Status: None   Collection Time: 04/10/21 10:17 AM   Specimen: BLOOD  Result Value Ref Range Status   Specimen Description BLOOD RIGHT ANTECUBITAL  Final   Special Requests   Final    BOTTLES DRAWN AEROBIC AND ANAEROBIC Blood Culture results may not be optimal due to an inadequate volume of blood received in culture bottles   Culture   Final    NO GROWTH 5 DAYS Performed at [facility], [address] [place], [address] [place]    Report Status 04/15/2021 FINAL  Final  Resp  Panel by RT-PCR (Flu A&B, Covid) Nasopharyngeal Swab     Status: None   Collection Time: 04/10/21 10:18 AM   Specimen: Nasopharyngeal Swab; Nasopharyngeal(NP) swabs in vial transport medium  Result Value Ref Range Status   SARS Coronavirus 2 by RT PCR NEGATIVE NEGATIVE Final    Comment: (NOTE) SARS-CoV-2 target nucleic acids are NOT DETECTED.  The SARS-CoV-2 RNA is generally detectable in upper respiratory specimens during the acute phase of infection. The lowest concentration of SARS-CoV-2 viral copies this assay can detect is 138 copies/mL. A negative result does not preclude SARS-Cov-2 infection and should not be used as the sole basis for treatment or other patient management decisions. A negative result may occur with  improper specimen collection/handling, submission of specimen other than nasopharyngeal swab, presence of viral mutation(s) within the areas targeted by this assay, and inadequate number of viral copies(<138 copies/mL). A negative result must be combined with clinical observations, patient history, and epidemiological information. The expected result is Negative.  Fact Sheet for Patients:  06/15/2021  Fact Sheet for Healthcare Providers:  04/12/21  This test is no t yet approved or cleared by the [employer] FDA and  has been authorized for detection and/or diagnosis of SARS-CoV-2 by FDA under an Emergency Use Authorization (EUA). This EUA will remain  in effect (meaning this test can be used) for the duration of the COVID-19 declaration under Section 564(b)(1) of the Act, 21 U.S.C.section 360bbb-3(b)(1), unless the authorization is terminated  or revoked sooner.       Influenza A by PCR NEGATIVE NEGATIVE Final   Influenza B by PCR NEGATIVE NEGATIVE Final    Comment: (NOTE) The Xpert Xpress SARS-CoV-2/FLU/RSV plus assay is intended as an aid in the diagnosis of influenza from Nasopharyngeal  swab specimens and should not be used as a sole basis for treatment. Nasal washings and  aspirates are unacceptable for Xpert Xpress SARS-CoV-2/FLU/RSV testing.  Fact Sheet for Patients: [employer]  Fact Sheet for Healthcare Providers: [employer]it  This test is not yet approved or cleared by the [place] FDA and has been authorized for detection and/or diagnosis of SARS-CoV-2 by FDA under an Emergency Use Authorization (EUA). This EUA will remain in effect (meaning this test can be used) for the duration of the COVID-19 declaration under Section 564(b)(1) of the Act, 21 U.S.C. section 360bbb-3(b)(1), unless the authorization is terminated or revoked.  Performed at [facility], [address] [place], [place] [address]   Culture, blood (routine x 2)     Status: None   Collection Time: 04/10/21 10:22 AM   Specimen: BLOOD LEFT HAND  Result Value Ref Range Status   Specimen Description BLOOD LEFT HAND  Final   Special Requests   Final    BOTTLES DRAWN AEROBIC AND ANAEROBIC Blood Culture results may not be optimal due to an inadequate volume of blood received in culture bottles   Culture   Final    NO GROWTH 5 DAYS Performed at [facility], [address] [place], [place] [address]    Report Status 04/15/2021 FINAL  Final  Urine Culture     Status: None   Collection Time: 04/12/21  2:38 PM   Specimen: Urine, Clean Catch  Result Value Ref Range Status   Specimen Description URINE, CLEAN CATCH  Final   Special Requests NONE  Final   Culture   Final    NO GROWTH Performed at [facility], [address], [place], [place] [address]    Report Status 04/13/2021 FINAL  Final  MRSA Next Gen by PCR, Nasal     Status: Abnormal   Collection Time: 04/12/21  2:38 PM   Specimen: Nasal Mucosa; Nasal Swab  Result Value Ref Range Status   MRSA by PCR Next Gen DETECTED (A) NOT DETECTED Final    Comment:  RESULT CALLED TO, READ BACK BY AND VERIFIED WITH: A. LEWIS RN, AT [phone] 04/12/21 D.VANHOOK (NOTE) The GeneXpert MRSA Assay (FDA approved for NASAL specimens only), is one component of a comprehensive MRSA colonization surveillance program. It is not intended to diagnose MRSA infection nor to guide or monitor treatment for MRSA infections. Test performance is not FDA approved in patients less than 14 years old. Performed at [facility], [address], [place], [place] [address]       Radiology Studies: No results found.  Time spent: 35 minutes, more than 50% at bedside involved in discussing with the patient/counseling, also long discussion on the phone with the patient's brother  Pamella Pert, MD, PhD [facility]  Between 7 am - 7 pm I am available, please contact me via Amion (for emergencies) or Securechat (non urgent messages)  Between 7 pm - 7 am I am not available, please contact night coverage MD/APP via Amion

## 2021-04-18 DIAGNOSIS — F3111 Bipolar disorder, current episode manic without psychotic features, mild: Secondary | ICD-10-CM | POA: Diagnosis not present

## 2021-04-18 LAB — GLUCOSE, CAPILLARY
Glucose-Capillary: 98 mg/dL (ref 70–99)
Glucose-Capillary: 167 mg/dL — ABNORMAL HIGH (ref 70–99)
Glucose-Capillary: 235 mg/dL — ABNORMAL HIGH (ref 70–99)
Glucose-Capillary: 167 mg/dL — ABNORMAL HIGH (ref 70–99)

## 2021-04-18 LAB — RESP PANEL BY RT-PCR (FLU A&B, COVID) ARPGX2
Influenza A by PCR: NEGATIVE
Influenza B by PCR: NEGATIVE
SARS Coronavirus 2 by RT PCR: NEGATIVE

## 2021-04-18 NOTE — Plan of Care (Signed)

## 2021-04-18 NOTE — Consult Note (Signed)
[facility] Face-to-Face Psychiatry Consult   Reason for Consult: " Bipolar Disorder w/inappropriate behavior "  Referring Physician:  Lafe Garin Patient Identification: Becky Hendricks MRN:  [id number] Principal Diagnosis: Bipolar affective disorder, currently manic, mild (HCC) Diagnosis:  Principal Problem:   Bipolar affective disorder, currently manic, mild (HCC) Active Problems:   Hypoxia   Acute on chronic respiratory failure (HCC)  Total Time spent with patient: 20 minutes   Subjective: I, glad you are here. We need to talk about this medication.   Objective: Patient is seen and reassessed by this nurse practitioner, case discussed with Dr. Lucianne Muss. At this present time patient is alert and oriented x 2, she is calm and cooperative. She is observed to be lying in bed, and opens her eyes when her name is called. She appears to be euthymic and shows willingness to talk about her medications. She feels that she is taking too many psychiatric meds, and doesn't like the way it makes her feel. She is unable to tell me exactly how she feels. She is able to remain attentive throughout, before she starts complaining about ear pain (L). On today's evaluation she further denies any psychosis, hallucinations, and or delusions. She denies any suicidal ideations, homicidal ideations, and or auditory or visual hallucinations.  Furthermore she does not appear to be exhibiting any delusions, and or responding to internal and external stimuli.    Past Psychiatric History: bipolar.  Was previously taking Depakote 500 mg p.o. twice daily, Risperdal 2 mg p.o. twice daily.  Risk to Self:  none Risk to Others:  none Prior Inpatient Therapy:  unknown Prior Outpatient Therapy:  unknown  Past Medical History:  Past Medical History:  Diagnosis Date   Atrial fibrillation (HCC)    Bipolar disorder (HCC)    Bipolar disorder (HCC) 04/27/2020   Clostridium difficile diarrhea 08/02/2019   Dehydration    Essential hypertension     Fall 02/11/2020   Morbid obesity (HCC)    Osteomyelitis (HCC) 12/03/2019   Pneumonia due to COVID-19 virus 09/15/2019   Type 2 diabetes mellitus (HCC)    Weakness     Past Surgical History:  Procedure Laterality Date   INCISION AND DRAINAGE PERIRECTAL ABSCESS Left 12/04/2019   Procedure: IRRIGATION AND DEBRIDEMENT LEFT BUTTOCK ABSCESS;  Surgeon: Violeta Gelinas, MD;  Location: [facility] OR;  Service: General;  Laterality: Left;   INCISION AND DRAINAGE PERIRECTAL ABSCESS Left 12/10/2019   Procedure: REPEAT IRRIGATION AND DEBRIDEMENT BUTTOCK  ABSCESS;  Surgeon: Abigail Miyamoto, MD;  Location: [facility] OR;  Service: General;  Laterality: Left;   IR FLUORO GUIDE CV LINE RIGHT  11/25/2020   IR RADIOLOGIST EVAL & MGMT  12/29/2020   IR US GUIDE VASC ACCESS RIGHT  11/25/2020   Family History:  Family History  Family history unknown: Yes   Family Psychiatric  History: unknown Social History:  Social History   Substance and Sexual Activity  Alcohol Use Not Currently     Social History   Substance and Sexual Activity  Drug Use Not Currently    Social History   Socioeconomic History   Marital status: Single    Spouse name: Not on file   Number of children: Not on file   Years of education: Not on file   Highest education level: Not on file  Occupational History   Not on file  Tobacco Use   Smoking status: Never   Smokeless tobacco: Never  Vaping Use   Vaping Use: Unknown  Substance and Sexual Activity  Alcohol use: Not Currently   Drug use: Not Currently   Sexual activity: Not Currently    Birth control/protection: Post-menopausal  Other Topics Concern   Not on file  Social History Narrative   Not on file   Social Determinants of Health   Financial Resource Strain: Not on file  Food Insecurity: Not on file  Transportation Needs: Not on file  Physical Activity: Not on file  Stress: Not on file  Social Connections: Not on file   Additional Social History:    Allergies:    Allergies  Allergen Reactions   Chlorhexidine Gluconate Itching   Vancomycin     Vancomycin infusion related reaction- May need Vanc to be given at a slower rate    Acetazolamide Er Rash    Labs:  Results for orders placed or performed during the hospital encounter of 04/10/21 (from the past 48 hour(s))  Glucose, capillary     Status: Abnormal   Collection Time: 04/16/21 11:01 AM  Result Value Ref Range   Glucose-Capillary 163 (H) 70 - 99 mg/dL    Comment: Glucose reference range applies only to samples taken after fasting for at least 8 hours.  Glucose, capillary     Status: Abnormal   Collection Time: 04/16/21  4:09 PM  Result Value Ref Range   Glucose-Capillary 149 (H) 70 - 99 mg/dL    Comment: Glucose reference range applies only to samples taken after fasting for at least 8 hours.  Glucose, capillary     Status: Abnormal   Collection Time: 04/16/21  9:08 PM  Result Value Ref Range   Glucose-Capillary 184 (H) 70 - 99 mg/dL    Comment: Glucose reference range applies only to samples taken after fasting for at least 8 hours.   Comment 1 Notify RN    Comment 2 Document in Chart   Glucose, capillary     Status: Abnormal   Collection Time: 04/17/21  5:54 AM  Result Value Ref Range   Glucose-Capillary 117 (H) 70 - 99 mg/dL    Comment: Glucose reference range applies only to samples taken after fasting for at least 8 hours.   Comment 1 Notify RN    Comment 2 Document in Chart   Glucose, capillary     Status: Abnormal   Collection Time: 04/17/21 10:58 AM  Result Value Ref Range   Glucose-Capillary 152 (H) 70 - 99 mg/dL    Comment: Glucose reference range applies only to samples taken after fasting for at least 8 hours.  Glucose, capillary     Status: Abnormal   Collection Time: 04/17/21  3:41 PM  Result Value Ref Range   Glucose-Capillary 160 (H) 70 - 99 mg/dL    Comment: Glucose reference range applies only to samples taken after fasting for at least 8 hours.  Glucose,  capillary     Status: Abnormal   Collection Time: 04/17/21  8:35 PM  Result Value Ref Range   Glucose-Capillary 139 (H) 70 - 99 mg/dL    Comment: Glucose reference range applies only to samples taken after fasting for at least 8 hours.   Comment 1 Notify RN    Comment 2 Document in Chart   Glucose, capillary     Status: None   Collection Time: 04/18/21  6:18 AM  Result Value Ref Range   Glucose-Capillary 98 70 - 99 mg/dL    Comment: Glucose reference range applies only to samples taken after fasting for at least 8 hours.   Comment 1  Notify RN    Comment 2 Document in Chart     Current Facility-Administered Medications  Medication Dose Route Frequency Provider Last Rate Last Admin   0.9 %  sodium chloride infusion   Intravenous Continuous Simonne Martinet, NP 10 mL/hr at 04/10/21 1318 New Bag at 04/10/21 1318   0.9 %  sodium chloride infusion  250 mL Intravenous Continuous Migdalia Dk, MD   Stopped at 04/12/21 0825   apixaban (ELIQUIS) tablet 5 mg  5 mg Oral BID Simonne Martinet, NP   5 mg at 04/18/21 0931   atorvastatin (LIPITOR) tablet 10 mg  10 mg Oral Daily Simonne Martinet, NP   10 mg at 04/18/21 0930   budesonide (PULMICORT) nebulizer solution 0.5 mg  0.5 mg Nebulization BID Omar Person, MD   0.5 mg at 04/18/21 0834   divalproex (DEPAKOTE) DR tablet 750 mg  750 mg Oral BID Leatha Gilding, MD   750 mg at 04/18/21 0932   Gerhardt's butt cream   Topical BID Leatha Gilding, MD   Given at 04/17/21 3220   haloperidol lactate (HALDOL) injection 2-4 mg  2-4 mg Intravenous Q6H PRN Celine Mans, Rahul P, PA-C   4 mg at 04/13/21 0423   insulin aspart (novoLOG) injection 0-15 Units  0-15 Units Subcutaneous TID [facility] Omar Person, MD   2 Units at 04/17/21 2100   ipratropium-albuterol (DUONEB) 0.5-2.5 (3) MG/3ML nebulizer solution 3 mL  3 mL Nebulization Q6H PRN Omar Person, MD       loperamide (IMODIUM) capsule 2 mg  2 mg Oral PRN Duayne Cal, NP   2 mg at 04/14/21  0306   metoprolol tartrate (LOPRESSOR) tablet 12.5 mg  12.5 mg Oral Q6H Shalhoub, Deno Lunger, MD   12.5 mg at 04/18/21 0929   pantoprazole (PROTONIX) EC tablet 40 mg  40 mg Oral Daily Simonne Martinet, NP   40 mg at 04/18/21 0931   risperiDONE (RISPERDAL M-TABS) disintegrating tablet 1 mg  1 mg Oral Daily Lauro Franklin, MD   1 mg at 04/17/21 1422   risperiDONE (RISPERDAL M-TABS) disintegrating tablet 2 mg  2 mg Oral BID Lauro Franklin, MD   2 mg at 04/18/21 0933   tamsulosin (FLOMAX) capsule 0.4 mg  0.4 mg Oral Drake Leach, NP   0.4 mg at 04/18/21 2542    Musculoskeletal: Strength & Muscle Tone: within normal limits Gait & Station:  did not witness Patient leans: N/A  Psychiatric Specialty Exam: Physical Exam Vitals and nursing note reviewed.  Constitutional:      Appearance: Normal appearance.  HENT:     Head: Normocephalic.     Nose: Nose normal.  Pulmonary:     Effort: Pulmonary effort is normal.  Neurological:     General: No focal deficit present.     Mental Status: She is alert.  Psychiatric:        Attention and Perception: Perception normal.        Mood and Affect: Mood and affect normal.        Speech: Speech normal.        Behavior: Behavior is cooperative.        Thought Content: Thought content normal.        Cognition and Memory: Memory normal.        Judgment: Judgment is impulsive.    Review of Systems  Psychiatric/Behavioral: Negative.    All other systems reviewed and  are negative.  Blood pressure 116/75, pulse 67, temperature 98 F (36.7 C), resp. rate 20, height 5\' 6"  (1.676 m), weight 106.5 kg, SpO2 96 %.Body mass index is 37.9 kg/m.  General Appearance: Casual  Eye Contact:  Fair  Speech:  Normal Rate  Volume:  Normal  Mood:  Euthymic  Affect:  Congruent  Thought Process:  Coherent and Linear  Orientation:  Full (Time, Place, and Person)  Thought Content:  Logical  Suicidal Thoughts:  No  Homicidal Thoughts:  No  Memory:   Immediate;   Fair Recent;   Fair  Judgement:  Intact  Insight:  Present  Psychomotor Activity:  Normal  Concentration:  Concentration: Fair and Attention Span: Fair  Recall:  [employer]Fair  Fund of Knowledge:  Fair  Language:  Fair  Akathisia:  No  Handed:  Right  AIMS (if indicated):     Assets:  Desire for Improvement Housing Social Support  ADL's:  Impaired  Cognition:  Impaired,  Mild  Sleep:      Physical Exam: Physical Exam Vitals and nursing note reviewed.  Constitutional:      Appearance: Normal appearance.  HENT:     Head: Normocephalic.     Nose: Nose normal.  Pulmonary:     Effort: Pulmonary effort is normal.  Neurological:     General: No focal deficit present.     Mental Status: She is alert.  Psychiatric:        Attention and Perception: Perception normal.        Mood and Affect: Mood and affect normal.        Speech: Speech normal.        Behavior: Behavior is cooperative.        Thought Content: Thought content normal.        Cognition and Memory: Memory normal.        Judgment: Judgment is impulsive.   Review of Systems  Psychiatric/Behavioral: Negative.    All other systems reviewed and are negative. Blood pressure 116/75, pulse 67, temperature 98 F (36.7 C), resp. rate 20, height 5\' 6"  (1.676 m), weight 106.5 kg, SpO2 96 %. Body mass index is 37.9 kg/m.  Treatment Plan Summary:  75 year old female with a history of bipolar disorder, was previously taking Depakote and Risperdal. She denies any delusions, suicidal thoughts, and appears to be showing favorable response to current psychiatric medications.    Recommendations: -Continue Depakote 750mg  BID for aggression/agitation. Depakote level to be ordered on 09*9*22.  Depakote level 41.  -Continue Risperdal 2mg  po BID , and 1mg  at 2 pm.   Psych cleared at this time.   Disposition: No evidence of imminent risk to self or others at present.   Supportive therapy provided about ongoing  stressors.  Maryagnes Amosakia S Starkes-Perry, FNP 04/18/2021 10:36 AM

## 2021-04-18 NOTE — Progress Notes (Signed)
PROGRESS NOTE  Becky Hendricks ZOX:[id number] DOB: 04/12/1946 DOA: 04/10/2021 PCP: Roderic ScarceHartman, Lisa, MD   LOS: 8 days   Brief Narrative / Interim history: This is a 75 year old female who was just discharged from the hospital on 8/26 after being admitted for Sepsis, septic shock, and acute hypoxic and hypercarbic respiratory failure requiring mechanical ventilation.  She was treated successfully but felt to have significant underlying element of OHS/OSA and was actually discharged on BiPAP at at bedtime with 2 to 3 L of supplemental oxygen during the daytime.  She was initially admitted to the ICU but has been transferred to the hospitalist service on 9/1.  She was altered on admission, minimally responsive, tachypneic and ABG showed a pH of 7.31, PCO2 of 108 and PO2 of 95.  She was placed on NIPPV but did not need to be intubated  Subjective / 24h Interval events: She is sad and emotional this morning, she has been living in an SNF for the past 2 years, depending on others for things like self-care/feeding.  She is quite appropriate in her understanding of her situation.  She still does not want to use the BiPAP  Assessment & Plan: Principal Problem Acute on chronic hypercarbic respiratory failure, secondary to decompensated OHS/OSA, chronically right elevated hemidiaphragm, prior Covid and severe deconditioning -continue to encourage NIPPV, it is clear that if she is not wearing her BiPAP it does not take much for her to go into hypercarbic respiratory failure and decompensate.  Patient tells me she does not wear the BiPAP even if that means she will die, she makes reasonable statements however, last week, she exhibited symptoms of delirium with waxing and waning of attention, thought blocking, memory issues.  Her brother who knows her well maintains that this is not who she is and she is not at baseline and remains confused, and he, as a Management consultantdecision maker does not wish for hospice/comfort measures per most  recent palliative notes and my discussions with him -Today she seems a lot more appropriate that she was last week, sad about the whole situation but that is very understandable she has been SNF bound for the past 2 years.  Psychiatry saw her and adjusted medications which usually take several weeks to kick in per brother.  Even though she has not been using BiPAP here she has remained clinically stable, she can medically discharge to SNF and will continue to recommend nightly BiPAP if the patient agreeable.  Discussed with SW, SNF can take her back on Wednesday, Covid ordered today  Active Problems Acute metabolic encephalopathy superimposed on underlying bipolar disease -complicated by hypercarbia, impulsive at times, intermittently uncooperative. -Depakote level was low and dose was increased to 750 twice daily.  Risperidone was also increased by psychiatry.  Continue to monitor levels in an outpatient setting  History of permanent A. fib-continue Eliquis, continue metoprolol as her blood pressure allows.  She is rate controlled  Chronic diastolic CHF-she appears euvolemic  Essential hypertension-soft blood pressures currently, continue metoprolol as tolerated, hold Norvasc  Complicated UTI-urine cultures without growth, no need for ceftriaxone  Sacral decubitus ulcer, stage I, POA -local wound care  History of type 2 diabetes mellitus-Continue sliding scale  CBG (last 3)  Recent Labs    04/17/21 2035 04/18/21 0618 04/18/21 1130  GLUCAP 139* 98 167*     Scheduled Meds:  apixaban  5 mg Oral BID   atorvastatin  10 mg Oral Daily   budesonide (PULMICORT) nebulizer solution  0.5 mg Nebulization BID  divalproex  750 mg Oral BID   Gerhardt's butt cream   Topical BID   insulin aspart  0-15 Units Subcutaneous TID AC & HS   metoprolol tartrate  12.5 mg Oral Q6H   pantoprazole  40 mg Oral Daily   risperiDONE  1 mg Oral Daily   risperiDONE  2 mg Oral BID   tamsulosin  0.4 mg Oral  QODAY   Continuous Infusions:  sodium chloride 10 mL/hr at 04/10/21 1318   sodium chloride Stopped (04/12/21 0825)   PRN Meds:.haloperidol lactate, ipratropium-albuterol, loperamide  Diet Orders (From admission, onward)     Start     Ordered   04/12/21 0807  Diet Carb Modified Fluid consistency: Thin; Room service appropriate? Yes  Diet effective now       Question Answer Comment  Diet-HS Snack? Nothing   Calorie Level Medium 1600-2000   Fluid consistency: Thin   Room service appropriate? Yes      04/12/21 0807            DVT prophylaxis:  apixaban (ELIQUIS) tablet 5 mg     Code Status: Full Code  Family Communication: Called and updated brother [name] over the phone  Status is: Inpatient  Remains inpatient appropriate because:Inpatient level of care appropriate due to severity of illness  Dispo: The patient is from: SNF              Anticipated d/c is to: SNF              Patient currently is not medically stable to d/c.   Difficult to place patient No   Level of care: Progressive  Consultants:  PCCM Palliative care  Procedures:  none  Microbiology  Cultures - negative  Antimicrobials: none   Objective: Vitals:   04/18/21 0834 04/18/21 1212 04/18/21 1214 04/18/21 1216  BP:  (!) 139/54 127/69 127/69  Pulse: 67 67 70 62  Resp: 20     Temp:   98.1 F (36.7 C) 98.1 F (36.7 C)  TempSrc:   Oral Oral  SpO2: 96% 95% 95% 95%  Weight:      Height:        Intake/Output Summary (Last 24 hours) at 04/18/2021 1223 Last data filed at 04/18/2021 0900 Gross per 24 hour  Intake 700 ml  Output 2475 ml  Net -1775 ml    Filed Weights   04/16/21 0447 04/17/21 0110 04/18/21 0623  Weight: 105.5 kg 107.2 kg 106.5 kg    Examination: Constitutional: NAD, in bed ENMT: mmm Neck: normal, supple Respiratory: Clear bilaterally, no wheezing, no crackles, normal respiratory effort Cardiovascular: regular rate and rhythm, no murmurs, mild swelling of her  hands Abdomen: Soft, NT, ND, bowel sounds positive Skin: No rashes Neurologic: Nonfocal  Data Reviewed: I have independently reviewed following labs and imaging studies   CBC: Recent Labs  Lab 04/12/21 0016 04/14/21 0235 04/14/21 1149 04/16/21 0225  WBC 10.4 11.0*  --  9.1  NEUTROABS 5.0  --   --   --   HGB 10.6* 10.0* 10.5* 9.3*  HCT 34.0* 33.1* 31.0* 31.9*  MCV 90.9 93.5  --  95.5  PLT 160 146*  --  133*    Basic Metabolic Panel: Recent Labs  Lab 04/12/21 0016 04/13/21 0320 04/14/21 0235 04/14/21 1149 04/16/21 0225  NA 139 138 138 138 138  K 3.0* 3.9 4.0 4.5 4.8  CL 89* 94* 97*  --  96*  CO2 42* 40* 36*  --  38*  GLUCOSE 133* 132* 88  --  116*  BUN 15 15 13   --  11  CREATININE 0.47 0.83 0.50  --  0.64  CALCIUM 8.3* 8.0* 8.0*  --  8.1*  MG 2.2  --   --   --   --     Liver Function Tests: Recent Labs  Lab 04/12/21 0016 04/14/21 0235  AST 9* 6*  ALT 8 7  ALKPHOS 30* 29*  BILITOT 0.3 0.5  PROT 4.4* 4.5*  ALBUMIN 2.1* 2.0*    Coagulation Profile: No results for input(s): INR, PROTIME in the last 168 hours.  HbA1C: No results for input(s): HGBA1C in the last 72 hours. CBG: Recent Labs  Lab 04/17/21 1058 04/17/21 1541 04/17/21 2035 04/18/21 0618 04/18/21 1130  GLUCAP 152* 160* 139* 98 167*     Recent Results (from the past 240 hour(s))  Culture, blood (routine x 2)     Status: None   Collection Time: 04/10/21 10:17 AM   Specimen: BLOOD  Result Value Ref Range Status   Specimen Description BLOOD RIGHT ANTECUBITAL  Final   Special Requests   Final    BOTTLES DRAWN AEROBIC AND ANAEROBIC Blood Culture results may not be optimal due to an inadequate volume of blood received in culture bottles   Culture   Final    NO GROWTH 5 DAYS Performed at [facility], [address], [place], [place]    Report Status 04/15/2021 FINAL  Final  Resp Panel by RT-PCR (Flu A&B, Covid) Nasopharyngeal Swab     Status: None   Collection Time:  04/10/21 10:18 AM   Specimen: Nasopharyngeal Swab; Nasopharyngeal(NP) swabs in vial transport medium  Result Value Ref Range Status   SARS Coronavirus 2 by RT PCR NEGATIVE NEGATIVE Final    Comment: (NOTE) SARS-CoV-2 target nucleic acids are NOT DETECTED.  The SARS-CoV-2 RNA is generally detectable in upper respiratory specimens during the acute phase of infection. The lowest concentration of SARS-CoV-2 viral copies this assay can detect is 138 copies/mL. A negative result does not preclude SARS-Cov-2 infection and should not be used as the sole basis for treatment or other patient management decisions. A negative result may occur with  improper specimen collection/handling, submission of specimen other than nasopharyngeal swab, presence of viral mutation(s) within the areas targeted by this assay, and inadequate number of viral copies(<138 copies/mL). A negative result must be combined with clinical observations, patient history, and epidemiological information. The expected result is Negative.  Fact Sheet for Patients:  04/12/21  Fact Sheet for Healthcare Providers:  BloggerCourse.com  This test is no t yet approved or cleared by the [employer] FDA and  has been authorized for detection and/or diagnosis of SARS-CoV-2 by FDA under an Emergency Use Authorization (EUA). This EUA will remain  in effect (meaning this test can be used) for the duration of the COVID-19 declaration under Section 564(b)(1) of the Act, 21 U.S.C.section 360bbb-3(b)(1), unless the authorization is terminated  or revoked sooner.       Influenza A by PCR NEGATIVE NEGATIVE Final   Influenza B by PCR NEGATIVE NEGATIVE Final    Comment: (NOTE) The Xpert Xpress SARS-CoV-2/FLU/RSV plus assay is intended as an aid in the diagnosis of influenza from Nasopharyngeal swab specimens and should not be used as a sole basis for treatment. Nasal washings  and aspirates are unacceptable for Xpert Xpress SARS-CoV-2/FLU/RSV testing.  Fact Sheet for Patients: [place]  Fact Sheet for Healthcare Providers: BloggerCourse.com  This  test is not yet approved or cleared by the [place] and has been authorized for detection and/or diagnosis of SARS-CoV-2 by FDA under an Emergency Use Authorization (EUA). This EUA will remain in effect (meaning this test can be used) for the duration of the COVID-19 declaration under Section 564(b)(1) of the Act, 21 U.S.C. section 360bbb-3(b)(1), unless the authorization is terminated or revoked.  Performed at [facility], [address] [place], [place] [address]   Culture, blood (routine x 2)     Status: None   Collection Time: 04/10/21 10:22 AM   Specimen: BLOOD LEFT HAND  Result Value Ref Range Status   Specimen Description BLOOD LEFT HAND  Final   Special Requests   Final    BOTTLES DRAWN AEROBIC AND ANAEROBIC Blood Culture results may not be optimal due to an inadequate volume of blood received in culture bottles   Culture   Final    NO GROWTH 5 DAYS Performed at [facility], [address] [place], [place] [address]    Report Status 04/15/2021 FINAL  Final  Urine Culture     Status: None   Collection Time: 04/12/21  2:38 PM   Specimen: Urine, Clean Catch  Result Value Ref Range Status   Specimen Description URINE, CLEAN CATCH  Final   Special Requests NONE  Final   Culture   Final    NO GROWTH Performed at [facility], [address] [place], [place] [address]    Report Status 04/13/2021 FINAL  Final  MRSA Next Gen by PCR, Nasal     Status: Abnormal   Collection Time: 04/12/21  2:38 PM   Specimen: Nasal Mucosa; Nasal Swab  Result Value Ref Range Status   MRSA by PCR Next Gen DETECTED (A) NOT DETECTED Final    Comment: RESULT CALLED TO, READ BACK BY AND VERIFIED WITH: A. LEWIS RN, AT [phone] 04/12/21  D.VANHOOK (NOTE) The GeneXpert MRSA Assay (FDA approved for NASAL specimens only), is one component of a comprehensive MRSA colonization surveillance program. It is not intended to diagnose MRSA infection nor to guide or monitor treatment for MRSA infections. Test performance is not FDA approved in patients less than 52 years old. Performed at [facility], [address] [place], [place] [address]       Radiology Studies: No results found.  Time spent: 35 minutes, more than 50% at bedside involved in discussing with the patient/counseling, also long discussion on the phone with the patient's brother  Pamella Pert, MD, PhD [facility]  Between 7 am - 7 pm I am available, please contact me via Amion (for emergencies) or Securechat (non urgent messages)  Between 7 pm - 7 am I am not available, please contact night coverage MD/APP via Amion

## 2021-04-18 NOTE — Discharge Instructions (Signed)

## 2021-04-19 LAB — GLUCOSE, CAPILLARY
Glucose-Capillary: 136 mg/dL — ABNORMAL HIGH (ref 70–99)
Glucose-Capillary: 122 mg/dL — ABNORMAL HIGH (ref 70–99)
Glucose-Capillary: 101 mg/dL — ABNORMAL HIGH (ref 70–99)
Glucose-Capillary: 171 mg/dL — ABNORMAL HIGH (ref 70–99)

## 2021-04-19 MED ORDER — RISPERIDONE 1 MG PO TBDP
1.0000 mg | ORAL_TABLET | Freq: Every day | ORAL | Status: DC
Start: 2021-04-19 — End: 2021-12-10

## 2021-04-19 MED ORDER — RISPERIDONE 2 MG PO TBDP
2.0000 mg | ORAL_TABLET | Freq: Two times a day (BID) | ORAL | Status: DC
Start: 2021-04-19 — End: 2021-12-10

## 2021-04-19 MED ORDER — LOPERAMIDE HCL 2 MG PO CAPS: 2.0000 mg | ORAL_CAPSULE | ORAL | 0 refills | Status: DC | PRN

## 2021-04-19 MED ORDER — DIVALPROEX SODIUM 250 MG PO DR TAB: 750.0000 mg | DELAYED_RELEASE_TABLET | Freq: Two times a day (BID) | ORAL | Status: DC

## 2021-04-19 MED ORDER — METOPROLOL TARTRATE 25 MG PO TABS: 12.5000 mg | ORAL_TABLET | Freq: Two times a day (BID) | ORAL | Status: DC

## 2021-04-19 MED ORDER — SENNA-DOCUSATE SODIUM 8.6-50 MG PO TABS: 2.0000 | ORAL_TABLET | Freq: Every day | ORAL | Status: DC

## 2021-04-19 MED ORDER — TAMSULOSIN HCL 0.4 MG PO CAPS: 0.4000 mg | ORAL_CAPSULE | ORAL | Status: DC

## 2021-04-19 MED ORDER — GERHARDT'S BUTT CREAM
1.0000 | TOPICAL_CREAM | Freq: Two times a day (BID) | CUTANEOUS | Status: DC
Start: 2021-04-19 — End: 2021-04-21

## 2021-04-19 NOTE — [place] FL2 (Signed)
[place] MEDICAID FL2 LEVEL OF CARE SCREENING TOOL     IDENTIFICATION  Patient Name: Becky Hendricks Birthdate: 03-Aug-1946 Sex: female Admission Date (Current Location): 04/10/2021  [facility] and [place] Number:  Producer, television/film/video and Address:  [facility], [address] [place], [place] [address]      Provider Number: [id number]  Attending Physician Name and Address:  Pennie Banter, DO  Relative Name and Phone Number:  [name] Rapozo(brother) [phone]    Current Level of Care: Hospital Recommended Level of Care: Nursing Facility Prior Approval Number:    Date Approved/Denied:   PASRR Number:    Discharge Plan: Other (Comment) (LTC)    Current Diagnoses: Patient Active Problem List   Diagnosis Date Noted   Acute on chronic respiratory failure (HCC) 04/10/2021   Acute on chronic respiratory failure with hypoxia and hypercapnia (HCC)    Acute respiratory failure with hypoxia (HCC) 03/29/2021   Hyponatremia 03/29/2021   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   Pneumonia of lower lobe due to infectious organism 03/29/2021   Colitis 03/29/2021   Acute respiratory failure (HCC) 03/29/2021   Bipolar affective disorder, currently manic, mild (HCC) 11/19/2020   Constipation 11/18/2020   Encephalopathy 11/18/2020   MRSA bacteremia    SIRS (systemic inflammatory response syndrome) (HCC) 11/17/2020   Nausea and vomiting 11/17/2020   Mixed diabetic hyperlipidemia associated with type 2 diabetes mellitus (HCC) 11/17/2020   Type 2 diabetes mellitus with hyperglycemia, with long-term current use of insulin (HCC) 11/17/2020   Immunization due 07/26/2020   Psychosis (HCC)    Bipolar disorder (HCC) 04/27/2020   Fall 02/11/2020   Chronic diastolic CHF (congestive heart failure) (HCC) 01/22/2020   Drug rash    Acute osteomyelitis of pelvic region and thigh (HCC)    Palliative care by specialist    Goals of care, counseling/discussion    Edema  of upper extremity    Hyperphosphatemia    Cardiac arrest (HCC)    Sacral wound    Hypoxia    Severe sepsis (HCC) 12/03/2019   Abscess of multiple sites of buttock 12/03/2019   AF (paroxysmal atrial fibrillation) (HCC) 12/03/2019   Metabolic encephalopathy 09/15/2019   Urinary tract infection without hematuria 09/15/2019   Pressure injury of skin 09/10/2019   Morbid obesity with BMI of 40.0-44.9, adult (HCC) 02/27/2018    Orientation RESPIRATION BLADDER Height & Weight     Self, Time  O2 (2L) Continent Weight: 233 lb 7.5 oz (105.9 kg) Height:  5\' 6"  (167.6 cm)  BEHAVIORAL SYMPTOMS/MOOD NEUROLOGICAL BOWEL NUTRITION STATUS      Continent Diet (See d/c summary)  AMBULATORY STATUS COMMUNICATION OF NEEDS Skin   Total Care Verbally Normal                       Personal Care Assistance Level of Assistance  Bathing, Feeding, Total care Bathing Assistance: Maximum assistance Feeding assistance: Maximum assistance   Total Care Assistance: Maximum assistance   Functional Limitations Info  Sight, Hearing, Speech Sight Info: Impaired Hearing Info: Adequate Speech Info: Adequate    SPECIAL CARE FACTORS FREQUENCY  PT (By licensed PT), OT (By licensed OT)     PT Frequency: 5x/week OT Frequency: 5x/week            Contractures Contractures Info: Not present    Additional Factors Info  Code Status, Allergies, Psychotropic, Insulin Sliding Scale Code Status Info: Full code Allergies Info: Chlorhexidine Gluconate, Vancomycin, Acetazolamide Er  Psychotropic Info: see d/c summary Insulin Sliding Scale Info: see d/c summary       Current Medications (04/19/2021):  This is the current hospital active medication list Current Facility-Administered Medications  Medication Dose Route Frequency Provider Last Rate Last Admin   0.9 %  sodium chloride infusion   Intravenous Continuous Simonne Martinet, NP 10 mL/hr at 04/10/21 1318 New Bag at 04/10/21 1318   0.9 %  sodium chloride  infusion  250 mL Intravenous Continuous Migdalia Dk, MD   Stopped at 04/12/21 0825   apixaban (ELIQUIS) tablet 5 mg  5 mg Oral BID Simonne Martinet, NP   5 mg at 04/19/21 1610   atorvastatin (LIPITOR) tablet 10 mg  10 mg Oral Daily Simonne Martinet, NP   10 mg at 04/19/21 0827   budesonide (PULMICORT) nebulizer solution 0.5 mg  0.5 mg Nebulization BID Omar Person, MD   0.5 mg at 04/19/21 0802   divalproex (DEPAKOTE) DR tablet 750 mg  750 mg Oral BID Leatha Gilding, MD   750 mg at 04/19/21 0827   Gerhardt's butt cream   Topical BID Leatha Gilding, MD   Given at 04/19/21 0827   haloperidol lactate (HALDOL) injection 2-4 mg  2-4 mg Intravenous Q6H PRN Celine Mans, Rahul P, PA-C   4 mg at 04/13/21 0423   insulin aspart (novoLOG) injection 0-15 Units  0-15 Units Subcutaneous TID [facility] Omar Person, MD   2 Units at 04/19/21 0606   ipratropium-albuterol (DUONEB) 0.5-2.5 (3) MG/3ML nebulizer solution 3 mL  3 mL Nebulization Q6H PRN Omar Person, MD       loperamide (IMODIUM) capsule 2 mg  2 mg Oral PRN Duayne Cal, NP   2 mg at 04/14/21 0306   metoprolol tartrate (LOPRESSOR) tablet 12.5 mg  12.5 mg Oral Q6H Shalhoub, Deno Lunger, MD   12.5 mg at 04/19/21 0827   pantoprazole (PROTONIX) EC tablet 40 mg  40 mg Oral Daily Simonne Martinet, NP   40 mg at 04/19/21 0407   risperiDONE (RISPERDAL M-TABS) disintegrating tablet 1 mg  1 mg Oral Daily Lauro Franklin, MD   1 mg at 04/18/21 1330   risperiDONE (RISPERDAL M-TABS) disintegrating tablet 2 mg  2 mg Oral BID Lauro Franklin, MD   2 mg at 04/19/21 0827   tamsulosin (FLOMAX) capsule 0.4 mg  0.4 mg Oral Drake Leach, NP   0.4 mg at 04/18/21 9604     Discharge Medications: Please see discharge summary for a list of discharge medications.  Relevant Imaging Results:  Relevant Lab Results:   Additional Information ss#[id number] [place], [place]

## 2021-04-19 NOTE — Discharge Summary (Signed)
Physician Discharge Summary  Becky Hendricks GGY:[id number] DOB: 1946/03/09 DOA: 04/10/2021  PCP: Roderic Scarce, MD  Admit date: 04/10/2021 Discharge date: 04/19/2021  Admitted From: SNF Disposition:  SNF  Recommendations for Outpatient Follow-up:  PLEASE OBTAIN DEPAKOTE LEVEL ON 04/21/21 Follow up with PCP in 1-2 weeks Please obtain BMP/CBC in one week   Home Health: No  Equipment/Devices: None   Discharge Condition: Stable  CODE STATUS: Full  Diet recommendation: Carb Modified     Discharge Diagnoses: Principal Problem:   Bipolar affective disorder, currently manic, mild (HCC) Active Problems:   Hypoxia   Acute on chronic respiratory failure (HCC)    Summary of HPI and Hospital Course:  Per Dr. Elvera Lennox 04/18/21: "This is a 75 year old female who was just discharged from the hospital on 8/26 after being admitted for Sepsis, septic shock, and acute hypoxic and hypercarbic respiratory failure requiring mechanical ventilation.  She was treated successfully but felt to have significant underlying element of OHS/OSA and was actually discharged on BiPAP at at bedtime with 2 to 3 L of supplemental oxygen during the daytime.  She was initially admitted to the ICU but has been transferred to the hospitalist service on 9/1.  She was altered on admission, minimally responsive, tachypneic and ABG showed a pH of 7.31, PCO2 of 108 and PO2 of 95.  She was placed on NIPPV but did not need to be intubated."    Acute on chronic hypercarbic respiratory failure, secondary to decompensated OHS/OSA, chronically right elevated hemidiaphragm, prior Covid and severe deconditioning -continue to encourage NIPPV, it is clear that if she is not wearing her BiPAP it does not take much for her to go into hypercarbic respiratory failure and decompensate.  Patient tells me she does not wear the BiPAP even if that means she will die, she makes reasonable statements however, last week, she exhibited symptoms of delirium  with waxing and waning of attention, thought blocking, memory issues.  Her brother who knows her well maintains that this is not who she is and she is not at baseline and remains confused, and he, as a Management consultant does not wish for hospice/comfort measures per most recent palliative notes and my discussions with him -Today she seems a lot more appropriate that she was last week, sad about the whole situation but that is very understandable she has been SNF bound for the past 2 years.  Psychiatry saw her and adjusted medications which usually take several weeks to kick in per brother.  Even though she has not been using BiPAP here she has remained clinically stable, she can medically discharge to SNF and will continue to recommend nightly BiPAP if the patient agreeable.  Discussed with SW, SNF can take her back on Wednesday, Covid ordered today    Acute metabolic encephalopathy superimposed on underlying bipolar disease -complicated by hypercarbia, impulsive at times, intermittently uncooperative. Mental status has improved, specifically manic symptoms have resolved.  Patient was seen by psychiatary. Depakote level increased and Risperdal added. --Depakote level should be re-checked on 04/21/21   History of permanent A. fib-continue Eliquis, continue metoprolol with hold parameters for BP. Heart rate controlled.   Chronic diastolic CHF-she appears euvolemic and stable.   Essential hypertension- soft blood pressures intermittently.  Coreg was changed to metoprolol.  -- continue metoprolol but hold if BP is soft.as tolerated --hold Norvasc - stopped at disharge  Complicated UTI RULED OUT -urine cultures without growth, no need for antibiotics.  Patient asymptomatic.   Sacral decubitus ulcer, stage  I, POA -local wound care  History of type 2 diabetes mellitus-Continue sliding scale   Discharge Instructions   Discharge Instructions     Call MD for:  extreme fatigue   Complete by: As  directed    Call MD for:  persistant dizziness or light-headedness   Complete by: As directed    Call MD for:  persistant nausea and vomiting   Complete by: As directed    Call MD for:  severe uncontrolled pain   Complete by: As directed    Call MD for:  temperature >100.4   Complete by: As directed    Diet - low sodium heart healthy   Complete by: As directed    Discharge instructions   Complete by: As directed    Please take all medications as prescribed.  Depakote was increased and Risperdal was added by psychiatry. PLEASE DRAW DEPAKOTE LEVEL ON Friday 04/21/2021   Discharge wound care:   Complete by: As directed    Keep areas of moisture in skin folds clean and DRY.    Use barrier creams as needed for skin protection.   Increase activity slowly   Complete by: As directed       Allergies as of 04/19/2021       Reactions   Chlorhexidine Gluconate Itching   Vancomycin    Vancomycin infusion related reaction- May need Vanc to be given at a slower rate    Acetazolamide Er Rash        Medication List     STOP taking these medications    amLODipine 10 MG tablet Commonly known as: NORVASC   carvedilol 6.25 MG tablet Commonly known as: COREG   insulin aspart 100 UNIT/ML injection Commonly known as: novoLOG   predniSONE 20 MG tablet Commonly known as: DELTASONE   risperiDONE 2 MG tablet Commonly known as: RISPERDAL Replaced by: risperiDONE 2 MG disintegrating tablet       TAKE these medications    apixaban 5 MG Tabs tablet Commonly known as: ELIQUIS Take 5 mg by mouth 2 (two) times daily.   atorvastatin 10 MG tablet Commonly known as: LIPITOR Take 10 mg by mouth daily.   calcium carbonate 500 MG chewable tablet Commonly known as: TUMS - dosed in mg elemental calcium Chew 500 mg by mouth daily.   Combivent Respimat 20-100 MCG/ACT Aers respimat Generic drug: Ipratropium-Albuterol Inhale 1 puff into the lungs every 12 (twelve) hours.    diphenhydrAMINE 2 % cream Commonly known as: BENADRYL Apply 1 application topically 3 (three) times daily as needed for itching.   divalproex 250 MG DR tablet Commonly known as: DEPAKOTE Take 3 tablets (750 mg total) by mouth 2 (two) times daily. What changed:  medication strength how much to take   docusate sodium 100 MG capsule Commonly known as: COLACE Take 1 capsule (100 mg total) by mouth 2 (two) times daily as needed for mild constipation.   ferrous sulfate 324 MG Tbec Take 324 mg by mouth daily.   Gerhardt's butt cream Crea Apply 1 application topically 2 (two) times daily.   insulin lispro 100 UNIT/ML injection Commonly known as: HUMALOG Inject into the skin in the morning and at bedtime. CBG 70 - 200: 0 units CBG 201 - 250: 2 units CBG 251 - 300: 4 units CBG 301 - 350: 6 units CBG 351 - 400: 8 units CBG 401 - 450: 10 units CBG 451 - 600: 12 units If CBG is > 450, give 12 units, recheck in  2 hours. If still >350, notify provider   Januvia 50 MG tablet Generic drug: sitaGLIPtin Take 50 mg by mouth daily.   loperamide 2 MG capsule Commonly known as: IMODIUM Take 1 capsule (2 mg total) by mouth as needed for diarrhea or loose stools.   metoprolol tartrate 25 MG tablet Commonly known as: LOPRESSOR Take 0.5 tablets (12.5 mg total) by mouth 2 (two) times daily. Hold if SBP<110 or DBP<60   multivitamin with minerals Tabs tablet Take 1 tablet by mouth daily.   pantoprazole 40 MG tablet Commonly known as: PROTONIX Take 1 tablet (40 mg total) by mouth daily.   polyethylene glycol 17 g packet Commonly known as: MIRALAX / GLYCOLAX Take 17 g by mouth daily.   risperiDONE 2 MG disintegrating tablet Commonly known as: RISPERDAL M-TABS Take 1 tablet (2 mg total) by mouth 2 (two) times daily. Replaces: risperiDONE 2 MG tablet   risperiDONE 1 MG disintegrating tablet Commonly known as: RISPERDAL M-TABS Take 1 tablet (1 mg total) by mouth daily.    sennosides-docusate sodium 8.6-50 MG tablet Commonly known as: SENOKOT-S Take 2 tablets by mouth daily. Hold if having loose or frequent stools What changed: additional instructions   tamsulosin 0.4 MG Caps capsule Commonly known as: FLOMAX Take 1 capsule (0.4 mg total) by mouth every other day. Start taking on: April 20, 2021   Vitamin D 125 MCG (5000 UT) Caps Take 10,000 Units by mouth once a week. Wednesday               Discharge Care Instructions  (From admission, onward)           Start     Ordered   04/19/21 0000  Discharge wound care:       Comments: Keep areas of moisture in skin folds clean and DRY.    Use barrier creams as needed for skin protection.   04/19/21 1251            Allergies  Allergen Reactions   Chlorhexidine Gluconate Itching   Vancomycin     Vancomycin infusion related reaction- May need Vanc to be given at a slower rate    Acetazolamide Er Rash     If you experience worsening of your admission symptoms, develop shortness of breath, life threatening emergency, suicidal or homicidal thoughts you must seek medical attention immediately by calling 911 or calling your MD immediately  if symptoms less severe.    Please note   You were cared for by a hospitalist during your hospital stay. If you have any questions about your discharge medications or the care you received while you were in the hospital after you are discharged, you can call the unit and asked to speak with the hospitalist on call if the hospitalist that took care of you is not available. Once you are discharged, your primary care physician will handle any further medical issues. Please note that NO REFILLS for any discharge medications will be authorized once you are discharged, as it is imperative that you return to your primary care physician (or establish a relationship with a primary care physician if you do not have one) for your aftercare needs so that they can  reassess your need for medications and monitor your lab values.   Consultations: Psychiatry    Procedures/Studies: DG Chest 2 View  Result Date: 03/29/2021 CLINICAL DATA:  Fatigue and dyspnea EXAM: CHEST - 2 VIEW COMPARISON:  11/27/2020 FINDINGS: Cardiac shadow is mildly prominent but stable. Previously seen  right jugular catheter is been removed in the interval. The overall inspiratory effort is poor. Mild central vascular congestion is noted without definitive edema. No sizable effusion is seen. IMPRESSION: Poor inspiratory effort. Mild vascular congestion without edema. Electronically Signed   By: Alcide Clever M.D.   On: 03/29/2021 17:28   DG Abd 1 View  Result Date: 03/29/2021 CLINICAL DATA:  OG tube positioning. EXAM: ABDOMEN - 1 VIEW COMPARISON:  None. FINDINGS: An orogastric tube is seen with its distal tip overlying the expected region of the body of the stomach (overlying the superior endplate of the L2 vertebral body). The bowel gas pattern is normal. Radiopaque contrast is seen within the bilateral renal collecting systems. IMPRESSION: Orogastric tube positioning, as described above. Electronically Signed   By: Aram Candela M.D.   On: 03/29/2021 22:23   CT Head Wo Contrast  Result Date: 04/10/2021 CLINICAL DATA:  Altered mental status.  Unable to move right arm. EXAM: CT HEAD WITHOUT CONTRAST TECHNIQUE: Contiguous axial images were obtained from the base of the skull through the vertex without intravenous contrast. COMPARISON:  04/02/2021 FINDINGS: Brain: There is no evidence for acute hemorrhage, hydrocephalus, mass lesion, or abnormal extra-axial fluid collection. No definite CT evidence for acute infarction. Diffuse loss of parenchymal volume is consistent with atrophy. Patchy low attenuation in the deep hemispheric and periventricular white matter is nonspecific, but likely reflects chronic microvascular ischemic demyelination. Vascular: No hyperdense vessel or unexpected  calcification. Skull: No evidence for fracture. No worrisome lytic or sclerotic lesion. Sinuses/Orbits: Tiny air-fluid level in the right sphenoid sinus is similar in the interval. Visualized portions of the globes and intraorbital fat are unremarkable. Other: None. IMPRESSION: 1. No acute intracranial abnormality. 2. Atrophy with chronic small vessel white matter ischemic disease. 3. Stable tiny air-fluid level in the right sphenoid sinus. Acute sinusitis would be a consideration. Electronically Signed   By: Kennith Center M.D.   On: 04/10/2021 12:03   CT HEAD WO CONTRAST ( )  Result Date: 04/02/2021 CLINICAL DATA:  TIA. Increased altered mental status and asymmetric pupils. EXAM: CT HEAD WITHOUT CONTRAST TECHNIQUE: Contiguous axial images were obtained from the base of the skull through the vertex without intravenous contrast. COMPARISON:  03/29/2021 FINDINGS: Brain: No evidence of acute infarction, hemorrhage, hydrocephalus, extra-axial collection or mass lesion/mass effect. Generalized atrophy. Vascular: No hyperdense vessel or unexpected calcification. Skull: Normal. Negative for fracture or focal lesion. Sinuses/Orbits: Patchy opacification of paranasal sinuses, overall mild. IMPRESSION: No acute or interval finding. Electronically Signed   By: Marnee Spring M.D.   On: 04/02/2021 09:53   CT HEAD WO CONTRAST ( )  Result Date: 03/29/2021 CLINICAL DATA:  Delirium, fatigue, altered mental status EXAM: CT HEAD WITHOUT CONTRAST TECHNIQUE: Contiguous axial images were obtained from the base of the skull through the vertex without intravenous contrast. COMPARISON:  11/21/2020 FINDINGS: Brain: No evidence of acute infarction, hemorrhage, hydrocephalus, extra-axial collection or mass lesion/mass effect. Periventricular white matter changes, likely the sequela of chronic small vessel ischemic disease. Vascular: No hyperdense vessel or unexpected calcification. Skull: Normal. Negative for fracture or focal  lesion. Sinuses/Orbits: Air-fluid level in the right sphenoid sinus. Mild mucosal thickening in the ethmoid air cells. The orbits are unremarkable. Other: The mastoids are clear. IMPRESSION: 1. No acute intracranial process. 2. Air-fluid level in the right sphenoid sinus, which can be seen in the setting of acute sinusitis. Electronically Signed   By: Wiliam Ke M.D.   On: 03/29/2021 16:56   MR BRAIN WO CONTRAST  Result Date: 04/02/2021 CLINICAL DATA:  Mental status change, unknown cause EXAM: MRI HEAD WITHOUT CONTRAST TECHNIQUE: Multiplanar, multiecho pulse sequences of the brain and surrounding structures were obtained without intravenous contrast. COMPARISON:  11/19/2020 FINDINGS: Brain: There is no acute infarction or intracranial hemorrhage. There is no intracranial mass, mass effect, or edema. There is no hydrocephalus or extra-axial fluid collection. Prominence of the ventricles and sulci reflects generalized parenchymal volume loss. Patchy T2 hyperintensity in the supratentorial white matter is nonspecific but may reflect minor chronic microvascular ischemic changes. Signal abnormality on the prior study has resolved. Vascular: Major vessel flow voids at the skull base are preserved. Skull and upper cervical spine: Normal marrow signal is preserved. Sinuses/Orbits: Mild mucosal thickening.  Orbits are unremarkable. Other: Sella is unremarkable. Mild patchy mastoid fluid opacification. IMPRESSION: No acute infarction, hemorrhage, or mass. Electronically Signed   By: Guadlupe Spanish M.D.   On: 04/02/2021 18:36   CT ABDOMEN PELVIS W CONTRAST  Result Date: 03/29/2021 CLINICAL DATA:  Acute generalized abdominal pain. EXAM: CT ABDOMEN AND PELVIS WITH CONTRAST TECHNIQUE: Multidetector CT imaging of the abdomen and pelvis was performed using the standard protocol following bolus administration of intravenous contrast. CONTRAST:  51mL OMNIPAQUE IOHEXOL 350 MG/ML SOLN COMPARISON:  November 17, 2020. FINDINGS:  Lower chest: Mild bilateral posterior basilar subsegmental atelectasis or infiltrates are noted with small bilateral pleural effusions. Hepatobiliary: Cholelithiasis is noted. No biliary dilatation is noted. The liver is unremarkable. Pancreas: Unremarkable. No pancreatic ductal dilatation or surrounding inflammatory changes. Spleen: Normal in size without focal abnormality. Adrenals/Urinary Tract: Adrenal glands appear normal. Small nonobstructive left renal calculus is noted. Right renal cyst is noted. No hydronephrosis or renal obstruction is noted. Urinary bladder is unremarkable. Stomach/Bowel: Stomach appears normal. There is no definite evidence of bowel obstruction. The appendix is not clearly visualized. Large amount of stool seen in the distal sigmoid colon and rectum with wall thickening suggesting some degree of proctocolitis. Vascular/Lymphatic: Aortic atherosclerosis. No enlarged abdominal or pelvic lymph nodes. Reproductive: Stable uterine fibroids. No adnexal abnormality is noted. Other: Small fat containing left inguinal hernia is noted. No ascites is noted. Musculoskeletal: No acute or significant osseous findings. IMPRESSION: Mild bilateral posterior basilar subsegmental atelectasis or infiltrates are noted with small bilateral pleural effusions. Cholelithiasis. Large amount of stool seen in distal sigmoid colon and rectum with wall thickening suggesting some degree of proctocolitis. Small nonobstructive left renal calculus. No hydronephrosis or renal obstruction is noted. Stable uterine fibroids. Small fat containing left inguinal hernia. Aortic Atherosclerosis (ICD10-I70.0). Electronically Signed   By: Lupita Raider M.D.   On: 03/29/2021 18:40   DG CHEST PORT 1 VIEW  Result Date: 04/12/2021 CLINICAL DATA:  Acute respiratory failure with hypoxia. EXAM: PORTABLE CHEST 1 VIEW COMPARISON:  Chest x-ray 04/10/2021 FINDINGS: Heart is enlarged. Right greater than left lower lobe airspace disease  and effusion again noted. Pulmonary congestion is stable. There is no significant interval change. IMPRESSION: 1. Stable appearance of right greater than left lower lobe airspace disease and effusion. 2. Stable cardiomegaly and pulmonary congestion. Electronically Signed   By: Marin Roberts M.D.   On: 04/12/2021 07:55   DG Chest Port 1 View  Result Date: 04/10/2021 CLINICAL DATA:  75 year old female with history of shortness of breath, weakness and fatigue. Elevated blood pressure. EXAM: PORTABLE CHEST 1 VIEW COMPARISON:  Chest x-ray 04/01/2021. FINDINGS: Chronic elevation of the right hemidiaphragm, similar to the prior study. Bibasilar opacities (right greater than left) which may reflect areas of atelectasis  and/or consolidation. No definite pleural effusions. No pneumothorax. No evidence of pulmonary edema. Heart size appears borderline to mildly enlarged, likely accentuated by low lung volumes and portable AP technique. Upper mediastinal contours are within normal limits allowing for patient positioning. IMPRESSION: 1. Persistent low lung volumes with chronic elevation of the right hemidiaphragm and bibasilar opacities (right greater than left) which may reflect areas of atelectasis and/or consolidation. Electronically Signed   By: Trudie Reed M.D.   On: 04/10/2021 11:32   DG CHEST PORT 1 VIEW  Result Date: 04/01/2021 CLINICAL DATA:  Acute respiratory failure EXAM: PORTABLE CHEST 1 VIEW COMPARISON:  Two days ago FINDINGS: Extubation of the trachea and esophagus. Very low volume chest with dense opacity on the right more than left. Cardiopericardial enlargement. No visible pneumothorax. IMPRESSION: Low volume chest with dense opacity at the bases that correlates with atelectasis on recent abdominal CT. No worsening after extubation. Electronically Signed   By: Marnee Spring M.D.   On: 04/01/2021 07:32   DG CHEST PORT 1 VIEW  Result Date: 03/30/2021 CLINICAL DATA:  Status post  re-intubation EXAM: PORTABLE CHEST 1 VIEW COMPARISON:  Film from earlier in the same day. FINDINGS: Endotracheal tube is noted 2 cm above the carina. Gastric catheter extends into the stomach. Cardiac shadow is enlarged but stable. Bilateral pleural effusions are again noted. Parenchymal opacities are seen as well. IMPRESSION: Endotracheal tube in satisfactory position following re-intubation. Remainder of the study is stable. Electronically Signed   By: Alcide Clever M.D.   On: 03/30/2021 23:54   DG Chest Port 1 View  Result Date: 03/30/2021 CLINICAL DATA:  Acute respiratory failure EXAM: PORTABLE CHEST 1 VIEW COMPARISON:  03/29/2021 FINDINGS: ET tube terminates 2.5 cm above the carina. Enteric tube courses below the diaphragm with distal tip beyond the inferior margin of the film. Stable cardiomediastinal contours. Low lung volumes. Persistent bibasilar opacities with probable small bilateral pleural effusions. No pneumothorax. IMPRESSION: Persistent bibasilar opacities with probable small bilateral pleural effusions. Electronically Signed   By: Duanne Guess D.O.   On: 03/30/2021 08:07   DG Chest Port 1 View  Result Date: 03/29/2021 CLINICAL DATA:  Endotracheal tube positioning. EXAM: PORTABLE CHEST 1 VIEW COMPARISON:  March 29, 2021 (5:07 p.m.) FINDINGS: An endotracheal tube is seen with its distal tip approximately 2.5 cm from the carina. A nasogastric tube is noted with its distal end extending below the level of the diaphragm. Mild to moderate severity areas of atelectasis and/or infiltrate are seen within the bilateral lung bases. There is no evidence of a pleural effusion or pneumothorax. There is mild to moderate severity enlargement of the cardiac silhouette. Degenerative changes seen throughout the thoracic spine. IMPRESSION: Endotracheal tube positioning, as described above. Electronically Signed   By: Aram Candela M.D.   On: 03/29/2021 22:25   ECHOCARDIOGRAM COMPLETE  Result Date:  04/03/2021    ECHOCARDIOGRAM REPORT   Patient Name:   FRANK PILGER Date of Exam: 04/03/2021 Medical Rec #:  [id number]      Height:       66.0 in Accession #:    [id number]     Weight:       236.1 lb Date of Birth:  03-Dec-1945       BSA:          2.146 m Patient Age:    75 years       BP:           156/63 mmHg Patient Gender: F  HR:           70 bpm. Exam Location:  Inpatient Procedure: 2D Echo, Cardiac Doppler and Color Doppler Indications:    CHF  History:        Patient has prior history of Echocardiogram examinations, most                 recent 11/19/2020. Arrythmias:Atrial Fibrillation and Cardiac                 Arrest; Risk Factors:Diabetes and Dyslipidemia.  Sonographer:    Neomia Dear RDCS Referring Phys: [id number] MURALI RAMASWAMY  Sonographer Comments: Technically challenging study due to limited acoustic windows and patient is morbidly obese. Image acquisition challenging due to patient body habitus. IMPRESSIONS  1. Technically difficult study. Left ventricular ejection fraction, by estimation, is 60 to 65%. The left ventricle has normal function. The left ventricle has no regional wall motion abnormalities. There is moderate asymmetric left ventricular hypertrophy of the basal-septal segment.     Left ventricular diastolic parameters are consistent with Grade II diastolic dysfunction (pseudonormalization). Elevated left atrial pressure.  2. Right ventricule is poorly visualized but grossly normal size and systolic function. There is moderately elevated pulmonary artery systolic pressure. The estimated right ventricular systolic pressure is 47.7 mmHg.  3. The mitral valve is normal in structure. No evidence of mitral valve regurgitation.  4. The aortic valve is tricuspid. Aortic valve regurgitation is not visualized. No aortic stenosis is present. FINDINGS  Left Ventricle: Left ventricular ejection fraction, by estimation, is 60 to 65%. The left ventricle has normal function. The left ventricle has  no regional wall motion abnormalities. The left ventricular internal cavity size was normal in size. There is  moderate asymmetric left ventricular hypertrophy of the basal-septal segment. Left ventricular diastolic parameters are consistent with Grade II diastolic dysfunction (pseudonormalization). Elevated left atrial pressure. Right Ventricle: The right ventricular size is normal. Right vetricular wall thickness was not well visualized. Right ventricular systolic function is normal. There is moderately elevated pulmonary artery systolic pressure. The tricuspid regurgitant velocity is 3.15 m/s, and with an assumed right atrial pressure of 8 mmHg, the estimated right ventricular systolic pressure is 47.7 mmHg. Left Atrium: Left atrial size was normal in size. Right Atrium: Right atrial size was normal in size. Pericardium: There is no evidence of pericardial effusion. Presence of pericardial fat pad. Mitral Valve: The mitral valve is normal in structure. No evidence of mitral valve regurgitation. Tricuspid Valve: The tricuspid valve is normal in structure. Tricuspid valve regurgitation is trivial. Aortic Valve: The aortic valve is tricuspid. Aortic valve regurgitation is not visualized. No aortic stenosis is present. Aortic valve mean gradient measures 5.0 mmHg. Aortic valve peak gradient measures 10.0 mmHg. Aortic valve area, by VTI measures 1.79  cm. Pulmonic Valve: The pulmonic valve was not well visualized. Pulmonic valve regurgitation is trivial. Aorta: The aortic root and ascending aorta are structurally normal, with no evidence of dilitation. IAS/Shunts: The interatrial septum was not well visualized.  LEFT VENTRICLE PLAX 2D LVIDd:         4.50 cm     Diastology LVIDs:         3.30 cm     LV e' medial:    4.90 cm/s LV PW:         1.60 cm     LV E/e' medial:  20.8 LV IVS:        1.50 cm     LV e' lateral:  7.62 cm/s LVOT diam:     1.80 cm     LV E/e' lateral: 13.4 LV SV:         67 LV SV Index:   31 LVOT  Area:     2.54 cm  LV Volumes (MOD) LV vol d, MOD A4C: 64.0 ml LV vol s, MOD A4C: 21.2 ml LV SV MOD A4C:     64.0 ml RIGHT VENTRICLE RV Basal diam:  3.80 cm RV Mid diam:    2.00 cm RV S prime:     16.00 cm/s TAPSE (M-mode): 2.6 cm LEFT ATRIUM           Index       RIGHT ATRIUM           Index LA diam:      3.80 cm 1.77 cm/m  RA Area:     14.80 cm LA Vol (A4C): 63.9 ml 29.78 ml/m RA Volume:   34.90 ml  16.26 ml/m  AORTIC VALVE                    PULMONIC VALVE AV Area (Vmax):    1.72 cm     PV Vmax:       0.98 m/s AV Area (Vmean):   1.68 cm     PV Vmean:      66.000 cm/s AV Area (VTI):     1.79 cm     PV VTI:        0.230 m AV Vmax:           158.00 cm/s  PV Peak grad:  3.9 mmHg AV Vmean:          106.000 cm/s PV Mean grad:  2.0 mmHg AV VTI:            0.376 m AV Peak Grad:      10.0 mmHg AV Mean Grad:      5.0 mmHg LVOT Vmax:         107.00 cm/s LVOT Vmean:        70.000 cm/s LVOT VTI:          0.264 m LVOT/AV VTI ratio: 0.70  AORTA Ao Root diam: 2.70 cm Ao Asc diam:  3.60 cm MITRAL VALVE                TRICUSPID VALVE MV Area (PHT): 4.57 cm     TR Peak grad:   39.7 mmHg MV Decel Time: 166 msec     TR Vmax:        315.00 cm/s MV E velocity: 102.00 cm/s MV A velocity: 123.00 cm/s  SHUNTS MV E/A ratio:  0.83         Systemic VTI:  0.26 m                             Systemic Diam: 1.80 cm Epifanio Lesches MD Electronically signed by Epifanio Lesches MD Signature Date/Time: 04/03/2021/5:53:41 PM    Final       Subjective: pt seen this AM, sleeping but woke easily to voice.  She does not make eye contact or participate in conversation, but did say no when asked if in any pain and "no" when asked if anything bothering her today.    Spoke with brother [name] by phone, he is in agreement with discharge and plans to visit patient at [facility] on Friday.   Discharge Exam: Vitals:   04/19/21  0802 04/19/21 1230  BP:  131/78  Pulse: 78 (!) 59  Resp: 16 16  Temp:  98.7 F (37.1 C)  SpO2:  98%    Vitals:   04/19/21 0107 04/19/21 0358 04/19/21 0802 04/19/21 1230  BP:  (!) 117/46  131/78  Pulse:  79 78 (!) 59  Resp:  20 16 16   Temp:  97.6 F (36.4 C)  98.7 F (37.1 C)  TempSrc:  Oral  Oral  SpO2:  100%  98%  Weight: 105.9 kg     Height:        General: Pt is alert, awake, not in acute distress, calm Cardiovascular: RRR, S1/S2 +, no rubs, no gallops Respiratory: CTA bilaterally, no wheezing, no rhonchi Abdominal: Soft, NT, ND, bowel sounds + Extremities: 2-3+ BLE edema, no cyanosis Psych: normal mood, flat affect, no signs of hallucination or mania    The results of significant diagnostics from this hospitalization (including imaging, microbiology, ancillary and laboratory) are listed below for reference.     Microbiology: Recent Results (from the past 240 hour(s))  Culture, blood (routine x 2)     Status: None   Collection Time: 04/10/21 10:17 AM   Specimen: BLOOD  Result Value Ref Range Status   Specimen Description BLOOD RIGHT ANTECUBITAL  Final   Special Requests   Final    BOTTLES DRAWN AEROBIC AND ANAEROBIC Blood Culture results may not be optimal due to an inadequate volume of blood received in culture bottles   Culture   Final    NO GROWTH 5 DAYS Performed at [facility], [address], [place], [place] [address]    Report Status 04/15/2021 FINAL  Final  Resp Panel by RT-PCR (Flu A&B, Covid) Nasopharyngeal Swab     Status: None   Collection Time: 04/10/21 10:18 AM   Specimen: Nasopharyngeal Swab; Nasopharyngeal(NP) swabs in vial transport medium  Result Value Ref Range Status   SARS Coronavirus 2 by RT PCR NEGATIVE NEGATIVE Final    Comment: (NOTE) SARS-CoV-2 target nucleic acids are NOT DETECTED.  The SARS-CoV-2 RNA is generally detectable in upper respiratory specimens during the acute phase of infection. The lowest concentration of SARS-CoV-2 viral copies this assay can detect is 138 copies/mL. A negative result does not preclude  SARS-Cov-2 infection and should not be used as the sole basis for treatment or other patient management decisions. A negative result may occur with  improper specimen collection/handling, submission of specimen other than nasopharyngeal swab, presence of viral mutation(s) within the areas targeted by this assay, and inadequate number of viral copies(<138 copies/mL). A negative result must be combined with clinical observations, patient history, and epidemiological information. The expected result is Negative.  Fact Sheet for Patients:  BloggerCourse.com  Fact Sheet for Healthcare Providers:  SeriousBroker.it  This test is no t yet approved or cleared by the [place] FDA and  has been authorized for detection and/or diagnosis of SARS-CoV-2 by FDA under an Emergency Use Authorization (EUA). This EUA will remain  in effect (meaning this test can be used) for the duration of the COVID-19 declaration under Section 564(b)(1) of the Act, 21 U.S.C.section 360bbb-3(b)(1), unless the authorization is terminated  or revoked sooner.       Influenza A by PCR NEGATIVE NEGATIVE Final   Influenza B by PCR NEGATIVE NEGATIVE Final    Comment: (NOTE) The Xpert Xpress SARS-CoV-2/FLU/RSV plus assay is intended as an aid in the diagnosis of influenza from Nasopharyngeal swab specimens and should not  be used as a sole basis for treatment. Nasal washings and aspirates are unacceptable for Xpert Xpress SARS-CoV-2/FLU/RSV testing.  Fact Sheet for Patients: BloggerCourse.com  Fact Sheet for Healthcare Providers: SeriousBroker.it  This test is not yet approved or cleared by the [place] FDA and has been authorized for detection and/or diagnosis of SARS-CoV-2 by FDA under an Emergency Use Authorization (EUA). This EUA will remain in effect (meaning this test can be used) for the duration of  the COVID-19 declaration under Section 564(b)(1) of the Act, 21 U.S.C. section 360bbb-3(b)(1), unless the authorization is terminated or revoked.  Performed at [facility], [address] [place], [place] [address]   Culture, blood (routine x 2)     Status: None   Collection Time: 04/10/21 10:22 AM   Specimen: BLOOD LEFT HAND  Result Value Ref Range Status   Specimen Description BLOOD LEFT HAND  Final   Special Requests   Final    BOTTLES DRAWN AEROBIC AND ANAEROBIC Blood Culture results may not be optimal due to an inadequate volume of blood received in culture bottles   Culture   Final    NO GROWTH 5 DAYS Performed at [facility], [address], [place], [place] [address]    Report Status 04/15/2021 FINAL  Final  Urine Culture     Status: None   Collection Time: 04/12/21  2:38 PM   Specimen: Urine, Clean Catch  Result Value Ref Range Status   Specimen Description URINE, CLEAN CATCH  Final   Special Requests NONE  Final   Culture   Final    NO GROWTH Performed at [facility], [address] [place], [place] [address]    Report Status 04/13/2021 FINAL  Final  MRSA Next Gen by PCR, Nasal     Status: Abnormal   Collection Time: 04/12/21  2:38 PM   Specimen: Nasal Mucosa; Nasal Swab  Result Value Ref Range Status   MRSA by PCR Next Gen DETECTED (A) NOT DETECTED Final    Comment: RESULT CALLED TO, READ BACK BY AND VERIFIED WITH: A. LEWIS RN, AT [phone] 04/12/21 D.VANHOOK (NOTE) The GeneXpert MRSA Assay (FDA approved for NASAL specimens only), is one component of a comprehensive MRSA colonization surveillance program. It is not intended to diagnose MRSA infection nor to guide or monitor treatment for MRSA infections. Test performance is not FDA approved in patients less than 81 years old. Performed at [facility], [address] [place], [place] [address]   Resp Panel by RT-PCR (Flu A&B, Covid) Nasopharyngeal Swab     Status: None   Collection  Time: 04/18/21 12:18 PM   Specimen: Nasopharyngeal Swab; Nasopharyngeal(NP) swabs in vial transport medium  Result Value Ref Range Status   SARS Coronavirus 2 by RT PCR NEGATIVE NEGATIVE Final    Comment: (NOTE) SARS-CoV-2 target nucleic acids are NOT DETECTED.  The SARS-CoV-2 RNA is generally detectable in upper respiratory specimens during the acute phase of infection. The lowest concentration of SARS-CoV-2 viral copies this assay can detect is 138 copies/mL. A negative result does not preclude SARS-Cov-2 infection and should not be used as the sole basis for treatment or other patient management decisions. A negative result may occur with  improper specimen collection/handling, submission of specimen other than nasopharyngeal swab, presence of viral mutation(s) within the areas targeted by this assay, and inadequate number of viral copies(<138 copies/mL). A negative result must be combined with clinical observations, patient history, and epidemiological  information. The expected result is Negative.  Fact Sheet for Patients:  BloggerCourse.comhttps://www.fda.gov/media/152166/download  Fact Sheet for Healthcare Providers:  SeriousBroker.ithttps://www.fda.gov/media/152162/download  This test is no t yet approved or cleared by the [place] FDA and  has been authorized for detection and/or diagnosis of SARS-CoV-2 by FDA under an Emergency Use Authorization (EUA). This EUA will remain  in effect (meaning this test can be used) for the duration of the COVID-19 declaration under Section 564(b)(1) of the Act, 21 U.S.C.section 360bbb-3(b)(1), unless the authorization is terminated  or revoked sooner.       Influenza A by PCR NEGATIVE NEGATIVE Final   Influenza B by PCR NEGATIVE NEGATIVE Final    Comment: (NOTE) The Xpert Xpress SARS-CoV-2/FLU/RSV plus assay is intended as an aid in the diagnosis of influenza from Nasopharyngeal swab specimens and should not be used as a sole basis for treatment. Nasal washings  and aspirates are unacceptable for Xpert Xpress SARS-CoV-2/FLU/RSV testing.  Fact Sheet for Patients: BloggerCourse.comhttps://www.fda.gov/media/152166/download  Fact Sheet for Healthcare Providers: SeriousBroker.ithttps://www.fda.gov/media/152162/download  This test is not yet approved or cleared by the [place] FDA and has been authorized for detection and/or diagnosis of SARS-CoV-2 by FDA under an Emergency Use Authorization (EUA). This EUA will remain in effect (meaning this test can be used) for the duration of the COVID-19 declaration under Section 564(b)(1) of the Act, 21 U.S.C. section 360bbb-3(b)(1), unless the authorization is terminated or revoked.  Performed at [facility], [address] [place], [place] [id number]      Labs: BNP (last 3 results) Recent Labs    04/03/21 1333 04/10/21 1017 04/11/21 0220  BNP 234.6* 221.3* 181.0*   Basic Metabolic Panel: Recent Labs  Lab 04/13/21 0320 04/14/21 0235 04/14/21 1149 04/16/21 0225  NA 138 138 138 138  K 3.9 4.0 4.5 4.8  CL 94* 97*  --  96*  CO2 40* 36*  --  38*  GLUCOSE 132* 88  --  116*  BUN 15 13  --  11  CREATININE 0.83 0.50  --  0.64  CALCIUM 8.0* 8.0*  --  8.1*   Liver Function Tests: Recent Labs  Lab 04/14/21 0235  AST 6*  ALT 7  ALKPHOS 29*  BILITOT 0.5  PROT 4.5*  ALBUMIN 2.0*   No results for input(s): LIPASE, AMYLASE in the last 168 hours. Recent Labs  Lab 04/14/21 2139  AMMONIA 12   CBC: Recent Labs  Lab 04/14/21 0235 04/14/21 1149 04/16/21 0225  WBC 11.0*  --  9.1  HGB 10.0* 10.5* 9.3*  HCT 33.1* 31.0* 31.9*  MCV 93.5  --  95.5  PLT 146*  --  133*   Cardiac Enzymes: No results for input(s): CKTOTAL, CKMB, CKMBINDEX, TROPONINI in the last 168 hours. BNP: Invalid input(s): POCBNP CBG: Recent Labs  Lab 04/18/21 1542 04/18/21 2120 04/19/21 0107 04/19/21 0547 04/19/21 1107  GLUCAP 235* 167* 136* 122* 101*   D-Dimer No results for input(s): DDIMER in the last 72 hours. Hgb A1c No  results for input(s): HGBA1C in the last 72 hours. Lipid Profile No results for input(s): CHOL, HDL, LDLCALC, TRIG, CHOLHDL, LDLDIRECT in the last 72 hours. Thyroid function studies No results for input(s): TSH, T4TOTAL, T3FREE, THYROIDAB in the last 72 hours.  Invalid input(s): FREET3 Anemia work up No results for input(s): VITAMINB12, FOLATE, FERRITIN, TIBC, IRON, RETICCTPCT in the last 72 hours. Urinalysis    Component Value Date/Time   COLORURINE AMBER (A) 04/11/2021 0915   APPEARANCEUR CLOUDY (A) 04/11/2021  0915   LABSPEC 1.021 04/11/2021 0915   PHURINE 7.0 04/11/2021 0915   GLUCOSEU NEGATIVE 04/11/2021 0915   HGBUR MODERATE (A) 04/11/2021 0915   BILIRUBINUR NEGATIVE 04/11/2021 0915   KETONESUR 5 (A) 04/11/2021 0915   PROTEINUR 100 (A) 04/11/2021 0915   NITRITE NEGATIVE 04/11/2021 0915   LEUKOCYTESUR LARGE (A) 04/11/2021 0915   Sepsis Labs Invalid input(s): PROCALCITONIN,  WBC,  LACTICIDVEN Microbiology Recent Results (from the past 240 hour(s))  Culture, blood (routine x 2)     Status: None   Collection Time: 04/10/21 10:17 AM   Specimen: BLOOD  Result Value Ref Range Status   Specimen Description BLOOD RIGHT ANTECUBITAL  Final   Special Requests   Final    BOTTLES DRAWN AEROBIC AND ANAEROBIC Blood Culture results may not be optimal due to an inadequate volume of blood received in culture bottles   Culture   Final    NO GROWTH 5 DAYS Performed at [facility], [address], [place], [place] [address]    Report Status 04/15/2021 FINAL  Final  Resp Panel by RT-PCR (Flu A&B, Covid) Nasopharyngeal Swab     Status: None   Collection Time: 04/10/21 10:18 AM   Specimen: Nasopharyngeal Swab; Nasopharyngeal(NP) swabs in vial transport medium  Result Value Ref Range Status   SARS Coronavirus 2 by RT PCR NEGATIVE NEGATIVE Final    Comment: (NOTE) SARS-CoV-2 target nucleic acids are NOT DETECTED.  The SARS-CoV-2 RNA is generally detectable in upper  respiratory specimens during the acute phase of infection. The lowest concentration of SARS-CoV-2 viral copies this assay can detect is 138 copies/mL. A negative result does not preclude SARS-Cov-2 infection and should not be used as the sole basis for treatment or other patient management decisions. A negative result may occur with  improper specimen collection/handling, submission of specimen other than nasopharyngeal swab, presence of viral mutation(s) within the areas targeted by this assay, and inadequate number of viral copies(<138 copies/mL). A negative result must be combined with clinical observations, patient history, and epidemiological information. The expected result is Negative.  Fact Sheet for Patients:  BloggerCourse.com  Fact Sheet for Healthcare Providers:  SeriousBroker.it  This test is no t yet approved or cleared by the [place] FDA and  has been authorized for detection and/or diagnosis of SARS-CoV-2 by FDA under an Emergency Use Authorization (EUA). This EUA will remain  in effect (meaning this test can be used) for the duration of the COVID-19 declaration under Section 564(b)(1) of the Act, 21 U.S.C.section 360bbb-3(b)(1), unless the authorization is terminated  or revoked sooner.       Influenza A by PCR NEGATIVE NEGATIVE Final   Influenza B by PCR NEGATIVE NEGATIVE Final    Comment: (NOTE) The Xpert Xpress SARS-CoV-2/FLU/RSV plus assay is intended as an aid in the diagnosis of influenza from Nasopharyngeal swab specimens and should not be used as a sole basis for treatment. Nasal washings and aspirates are unacceptable for Xpert Xpress SARS-CoV-2/FLU/RSV testing.  Fact Sheet for Patients: BloggerCourse.com  Fact Sheet for Healthcare Providers: SeriousBroker.it  This test is not yet approved or cleared by the [place] FDA and has been  authorized for detection and/or diagnosis of SARS-CoV-2 by FDA under an Emergency Use Authorization (EUA). This EUA will remain in effect (meaning this test can be used) for the duration of the COVID-19 declaration under Section 564(b)(1) of the Act, 21 U.S.C. section 360bbb-3(b)(1), unless the authorization is terminated or revoked.  Performed at [facility], [address] [place], [place] [address]   Culture, blood (routine x 2)     Status: None   Collection Time: 04/10/21 10:22 AM   Specimen: BLOOD LEFT HAND  Result Value Ref Range Status   Specimen Description BLOOD LEFT HAND  Final   Special Requests   Final    BOTTLES DRAWN AEROBIC AND ANAEROBIC Blood Culture results may not be optimal due to an inadequate volume of blood received in culture bottles   Culture   Final    NO GROWTH 5 DAYS Performed at [facility], [address], [place], [place] [address]    Report Status 04/15/2021 FINAL  Final  Urine Culture     Status: None   Collection Time: 04/12/21  2:38 PM   Specimen: Urine, Clean Catch  Result Value Ref Range Status   Specimen Description URINE, CLEAN CATCH  Final   Special Requests NONE  Final   Culture   Final    NO GROWTH Performed at [facility], [address] [place], [place] [address]    Report Status 04/13/2021 FINAL  Final  MRSA Next Gen by PCR, Nasal     Status: Abnormal   Collection Time: 04/12/21  2:38 PM   Specimen: Nasal Mucosa; Nasal Swab  Result Value Ref Range Status   MRSA by PCR Next Gen DETECTED (A) NOT DETECTED Final    Comment: RESULT CALLED TO, READ BACK BY AND VERIFIED WITH: A. LEWIS RN, AT [phone] 04/12/21 D.VANHOOK (NOTE) The GeneXpert MRSA Assay (FDA approved for NASAL specimens only), is one component of a comprehensive MRSA colonization surveillance program. It is not intended to diagnose MRSA infection nor to guide or monitor treatment for MRSA infections. Test performance is not FDA approved in patients less  than 38 years old. Performed at [facility], [address] [place], [place] [address]   Resp Panel by RT-PCR (Flu A&B, Covid) Nasopharyngeal Swab     Status: None   Collection Time: 04/18/21 12:18 PM   Specimen: Nasopharyngeal Swab; Nasopharyngeal(NP) swabs in vial transport medium  Result Value Ref Range Status   SARS Coronavirus 2 by RT PCR NEGATIVE NEGATIVE Final    Comment: (NOTE) SARS-CoV-2 target nucleic acids are NOT DETECTED.  The SARS-CoV-2 RNA is generally detectable in upper respiratory specimens during the acute phase of infection. The lowest concentration of SARS-CoV-2 viral copies this assay can detect is 138 copies/mL. A negative result does not preclude SARS-Cov-2 infection and should not be used as the sole basis for treatment or other patient management decisions. A negative result may occur with  improper specimen collection/handling, submission of specimen other than nasopharyngeal swab, presence of viral mutation(s) within the areas targeted by this assay, and inadequate number of viral copies(<138 copies/mL). A negative result must be combined with clinical observations, patient history, and epidemiological information. The expected result is Negative.  Fact Sheet for Patients:  BloggerCourse.com  Fact Sheet for Healthcare Providers:  SeriousBroker.it  This test is no t yet approved or cleared by the [place] FDA and  has been authorized for detection and/or diagnosis of SARS-CoV-2 by FDA under an Emergency Use Authorization (EUA). This EUA will remain  in effect (meaning this test can be used) for the duration of the COVID-19 declaration under Section 564(b)(1) of the Act, 21 U.S.C.section 360bbb-3(b)(1), unless the authorization is terminated  or revoked sooner.       Influenza A by PCR NEGATIVE NEGATIVE Final  Influenza B by PCR NEGATIVE NEGATIVE Final    Comment: (NOTE) The Xpert  Xpress SARS-CoV-2/FLU/RSV plus assay is intended as an aid in the diagnosis of influenza from Nasopharyngeal swab specimens and should not be used as a sole basis for treatment. Nasal washings and aspirates are unacceptable for Xpert Xpress SARS-CoV-2/FLU/RSV testing.  Fact Sheet for Patients: BloggerCourse.com  Fact Sheet for Healthcare Providers: SeriousBroker.it  This test is not yet approved or cleared by the [place] FDA and has been authorized for detection and/or diagnosis of SARS-CoV-2 by FDA under an Emergency Use Authorization (EUA). This EUA will remain in effect (meaning this test can be used) for the duration of the COVID-19 declaration under Section 564(b)(1) of the Act, 21 U.S.C. section 360bbb-3(b)(1), unless the authorization is terminated or revoked.  Performed at [facility], [address] [place], [place] [address]      Time coordinating discharge: Over 30 minutes  SIGNED:   Pennie Banter, DO [facility] 04/19/2021, 12:51 PM   If 7PM-7AM, please contact night-coverage [email or web address]

## 2021-04-19 NOTE — TOC Transition Note (Signed)
Transition of Care [facility]) - CM/SW Discharge Note   Patient Details  Name: Becky Hendricks MRN: [id number] Date of Birth: 30-Jun-1946  [facility] CM/SW Contact:  Lynett Grimes Phone Number: 04/19/2021, 1:13 PM   Clinical Narrative:    Patient will DC to: [name] Anticipated DC date: 04/19/2021 Family notified: Pt Brother Transport by: Sharin Mons   Per MD patient ready for DC to [facility] RN to call report prior to discharge [phone]). RN, patient, patient's family, and facility notified of DC. Discharge Summary and FL2 sent to facility. DC packet on chart. Ambulance transport requested for patient.   CSW will sign off for now as social work intervention is no longer needed. Please consult [place] again if new needs arise.           Patient Goals and CMS Choice        Discharge Placement                       Discharge Plan and Services                                     Social Determinants of Health (SDOH) Interventions     Readmission Risk Interventions No flowsheet data found.

## 2021-04-21 ENCOUNTER — Other Ambulatory Visit: Payer: Self-pay

## 2021-04-21 ENCOUNTER — Encounter (HOSPITAL_COMMUNITY): Payer: Self-pay

## 2021-04-21 ENCOUNTER — Inpatient Hospital Stay (HOSPITAL_COMMUNITY)
Admission: EM | Admit: 2021-04-21 | Discharge: 2021-05-05 | DRG: 291 | Disposition: A | Discharge status: Skilled Nursing Facility | Payer: Medicare Other | Source: Skilled Nursing Facility | Attending: Internal Medicine | Admitting: Internal Medicine

## 2021-04-21 ENCOUNTER — Emergency Department (HOSPITAL_COMMUNITY): Payer: Medicare Other

## 2021-04-21 DIAGNOSIS — I110 Hypertensive heart disease with heart failure: Secondary | ICD-10-CM | POA: Diagnosis present

## 2021-04-21 DIAGNOSIS — I480 Paroxysmal atrial fibrillation: Secondary | ICD-10-CM | POA: Diagnosis present

## 2021-04-21 DIAGNOSIS — Z8616 Personal history of COVID-19: Secondary | ICD-10-CM | POA: Diagnosis not present

## 2021-04-21 DIAGNOSIS — F4310 Post-traumatic stress disorder, unspecified: Secondary | ICD-10-CM | POA: Diagnosis present

## 2021-04-21 DIAGNOSIS — G4733 Obstructive sleep apnea (adult) (pediatric): Secondary | ICD-10-CM | POA: Diagnosis not present

## 2021-04-21 DIAGNOSIS — E7220 Disorder of urea cycle metabolism, unspecified: Secondary | ICD-10-CM | POA: Diagnosis present

## 2021-04-21 DIAGNOSIS — I952 Hypotension due to drugs: Secondary | ICD-10-CM | POA: Diagnosis not present

## 2021-04-21 DIAGNOSIS — E876 Hypokalemia: Secondary | ICD-10-CM | POA: Diagnosis not present

## 2021-04-21 DIAGNOSIS — I4811 Longstanding persistent atrial fibrillation: Secondary | ICD-10-CM | POA: Diagnosis not present

## 2021-04-21 DIAGNOSIS — R069 Unspecified abnormalities of breathing: Secondary | ICD-10-CM

## 2021-04-21 DIAGNOSIS — R4182 Altered mental status, unspecified: Secondary | ICD-10-CM | POA: Diagnosis not present

## 2021-04-21 DIAGNOSIS — D649 Anemia, unspecified: Secondary | ICD-10-CM | POA: Diagnosis present

## 2021-04-21 DIAGNOSIS — Z6837 Body mass index (BMI) 37.0-37.9, adult: Secondary | ICD-10-CM | POA: Diagnosis not present

## 2021-04-21 DIAGNOSIS — Z91199 Patient's noncompliance with other medical treatment and regimen due to unspecified reason: Secondary | ICD-10-CM

## 2021-04-21 DIAGNOSIS — Z7901 Long term (current) use of anticoagulants: Secondary | ICD-10-CM

## 2021-04-21 DIAGNOSIS — J9622 Acute and chronic respiratory failure with hypercapnia: Secondary | ICD-10-CM | POA: Diagnosis present

## 2021-04-21 DIAGNOSIS — R001 Bradycardia, unspecified: Secondary | ICD-10-CM | POA: Diagnosis present

## 2021-04-21 DIAGNOSIS — E872 Acidosis: Secondary | ICD-10-CM | POA: Diagnosis present

## 2021-04-21 DIAGNOSIS — D696 Thrombocytopenia, unspecified: Secondary | ICD-10-CM | POA: Diagnosis present

## 2021-04-21 DIAGNOSIS — J9621 Acute and chronic respiratory failure with hypoxia: Secondary | ICD-10-CM | POA: Diagnosis present

## 2021-04-21 DIAGNOSIS — Z79899 Other long term (current) drug therapy: Secondary | ICD-10-CM

## 2021-04-21 DIAGNOSIS — L89152 Pressure ulcer of sacral region, stage 2: Secondary | ICD-10-CM | POA: Diagnosis present

## 2021-04-21 DIAGNOSIS — J9602 Acute respiratory failure with hypercapnia: Secondary | ICD-10-CM | POA: Diagnosis not present

## 2021-04-21 DIAGNOSIS — I5033 Acute on chronic diastolic (congestive) heart failure: Secondary | ICD-10-CM | POA: Diagnosis not present

## 2021-04-21 DIAGNOSIS — L89136 Pressure-induced deep tissue damage of right lower back: Secondary | ICD-10-CM | POA: Diagnosis present

## 2021-04-21 DIAGNOSIS — J9811 Atelectasis: Secondary | ICD-10-CM | POA: Diagnosis present

## 2021-04-21 DIAGNOSIS — I5043 Acute on chronic combined systolic (congestive) and diastolic (congestive) heart failure: Secondary | ICD-10-CM | POA: Diagnosis present

## 2021-04-21 DIAGNOSIS — L8990 Pressure ulcer of unspecified site, unspecified stage: Secondary | ICD-10-CM | POA: Diagnosis present

## 2021-04-21 DIAGNOSIS — I959 Hypotension, unspecified: Secondary | ICD-10-CM | POA: Diagnosis not present

## 2021-04-21 DIAGNOSIS — K219 Gastro-esophageal reflux disease without esophagitis: Secondary | ICD-10-CM | POA: Diagnosis present

## 2021-04-21 DIAGNOSIS — Z794 Long term (current) use of insulin: Secondary | ICD-10-CM

## 2021-04-21 DIAGNOSIS — L89304 Pressure ulcer of unspecified buttock, stage 4: Secondary | ICD-10-CM | POA: Diagnosis not present

## 2021-04-21 DIAGNOSIS — G9341 Metabolic encephalopathy: Secondary | ICD-10-CM | POA: Diagnosis present

## 2021-04-21 DIAGNOSIS — L89812 Pressure ulcer of head, stage 2: Secondary | ICD-10-CM | POA: Diagnosis present

## 2021-04-21 DIAGNOSIS — E119 Type 2 diabetes mellitus without complications: Secondary | ICD-10-CM | POA: Diagnosis present

## 2021-04-21 DIAGNOSIS — Z888 Allergy status to other drugs, medicaments and biological substances status: Secondary | ICD-10-CM

## 2021-04-21 DIAGNOSIS — R0602 Shortness of breath: Secondary | ICD-10-CM | POA: Diagnosis not present

## 2021-04-21 DIAGNOSIS — E785 Hyperlipidemia, unspecified: Secondary | ICD-10-CM | POA: Diagnosis present

## 2021-04-21 DIAGNOSIS — I4891 Unspecified atrial fibrillation: Secondary | ICD-10-CM | POA: Diagnosis not present

## 2021-04-21 DIAGNOSIS — F319 Bipolar disorder, unspecified: Secondary | ICD-10-CM | POA: Diagnosis not present

## 2021-04-21 DIAGNOSIS — R609 Edema, unspecified: Secondary | ICD-10-CM | POA: Diagnosis not present

## 2021-04-21 DIAGNOSIS — J9690 Respiratory failure, unspecified, unspecified whether with hypoxia or hypercapnia: Secondary | ICD-10-CM | POA: Diagnosis present

## 2021-04-21 DIAGNOSIS — G9340 Encephalopathy, unspecified: Secondary | ICD-10-CM

## 2021-04-21 DIAGNOSIS — J9601 Acute respiratory failure with hypoxia: Secondary | ICD-10-CM | POA: Diagnosis not present

## 2021-04-21 DIAGNOSIS — F315 Bipolar disorder, current episode depressed, severe, with psychotic features: Secondary | ICD-10-CM | POA: Diagnosis present

## 2021-04-21 DIAGNOSIS — Z20822 Contact with and (suspected) exposure to covid-19: Secondary | ICD-10-CM | POA: Diagnosis present

## 2021-04-21 DIAGNOSIS — Z9119 Patient's noncompliance with other medical treatment and regimen: Secondary | ICD-10-CM | POA: Diagnosis not present

## 2021-04-21 DIAGNOSIS — Z7189 Other specified counseling: Secondary | ICD-10-CM | POA: Diagnosis not present

## 2021-04-21 DIAGNOSIS — Z515 Encounter for palliative care: Secondary | ICD-10-CM | POA: Diagnosis not present

## 2021-04-21 DIAGNOSIS — R0902 Hypoxemia: Secondary | ICD-10-CM

## 2021-04-21 DIAGNOSIS — J9611 Chronic respiratory failure with hypoxia: Secondary | ICD-10-CM | POA: Diagnosis present

## 2021-04-21 LAB — CBC
HCT: 34.1 % — ABNORMAL LOW (ref 36.0–46.0)
Hemoglobin: 9.9 g/dL — ABNORMAL LOW (ref 12.0–15.0)
MCH: 28.2 pg (ref 26.0–34.0)
MCHC: 29 g/dL — ABNORMAL LOW (ref 30.0–36.0)
MCV: 97.2 fL (ref 80.0–100.0)
Platelets: 157 10*3/uL (ref 150–400)
RBC: 3.51 MIL/uL — ABNORMAL LOW (ref 3.87–5.11)
RDW: 14.6 % (ref 11.5–15.5)
WBC: 6.6 10*3/uL (ref 4.0–10.5)
nRBC: 0 % (ref 0.0–0.2)

## 2021-04-21 LAB — BLOOD GAS, VENOUS
Acid-Base Excess: 16.8 mmol/L — ABNORMAL HIGH (ref 0.0–2.0)
Bicarbonate: 43.1 mmol/L — ABNORMAL HIGH (ref 20.0–28.0)
Drawn by: 164
FIO2: 28
O2 Saturation: 61.7 %
Patient temperature: 37
pCO2, Ven: 78.3 mmHg (ref 44.0–60.0)
pH, Ven: 7.36 (ref 7.250–7.430)
pO2, Ven: 32.7 mmHg (ref 32.0–45.0)

## 2021-04-21 LAB — I-STAT ARTERIAL BLOOD GAS, ED
Acid-Base Excess: 18 mmol/L — ABNORMAL HIGH (ref 0.0–2.0)
Acid-Base Excess: 20 mmol/L — ABNORMAL HIGH (ref 0.0–2.0)
Bicarbonate: 48.4 mmol/L — ABNORMAL HIGH (ref 20.0–28.0)
Bicarbonate: 49.9 mmol/L — ABNORMAL HIGH (ref 20.0–28.0)
Calcium, Ion: 1.22 mmol/L (ref 1.15–1.40)
Calcium, Ion: 1.23 mmol/L (ref 1.15–1.40)
HCT: 30 % — ABNORMAL LOW (ref 36.0–46.0)
HCT: 30 % — ABNORMAL LOW (ref 36.0–46.0)
Hemoglobin: 10.2 g/dL — ABNORMAL LOW (ref 12.0–15.0)
Hemoglobin: 10.2 g/dL — ABNORMAL LOW (ref 12.0–15.0)
O2 Saturation: 90 %
O2 Saturation: 97 %
Patient temperature: 99.3
Patient temperature: 99.3
Potassium: 4.4 mmol/L (ref 3.5–5.1)
Potassium: 4.1 mmol/L (ref 3.5–5.1)
Sodium: 140 mmol/L (ref 135–145)
Sodium: 141 mmol/L (ref 135–145)
TCO2: >50 mmol/L — ABNORMAL HIGH (ref 22–32)
TCO2: >50 mmol/L — ABNORMAL HIGH (ref 22–32)
pCO2 arterial: 100.3 mmHg (ref 32.0–48.0)
pCO2 arterial: 95.2 mmHg (ref 32.0–48.0)
pH, Arterial: 7.293 — ABNORMAL LOW (ref 7.350–7.450)
pH, Arterial: 7.329 — ABNORMAL LOW (ref 7.350–7.450)
pO2, Arterial: 71 mmHg — ABNORMAL LOW (ref 83.0–108.0)
pO2, Arterial: 107 mmHg (ref 83.0–108.0)

## 2021-04-21 LAB — GLUCOSE, CAPILLARY
Glucose-Capillary: 115 mg/dL — ABNORMAL HIGH (ref 70–99)
Glucose-Capillary: 179 mg/dL — ABNORMAL HIGH (ref 70–99)

## 2021-04-21 LAB — RESP PANEL BY RT-PCR (FLU A&B, COVID) ARPGX2
Influenza A by PCR: NEGATIVE
Influenza B by PCR: NEGATIVE
SARS Coronavirus 2 by RT PCR: NEGATIVE

## 2021-04-21 LAB — HIV ANTIBODY (ROUTINE TESTING W REFLEX): HIV Screen 4th Generation wRfx: NONREACTIVE

## 2021-04-21 LAB — BRAIN NATRIURETIC PEPTIDE: B Natriuretic Peptide: 154.5 pg/mL — ABNORMAL HIGH (ref 0.0–100.0)

## 2021-04-21 IMAGING — DX DG CHEST 1V PORT
1 series · 1 of 1 positions shown · non-contrast
Comparison: [DATE]

CLINICAL DATA: Shortness of breath.  Respiratory distress.

EXAM:
PORTABLE CHEST 1 VIEW

[chest]
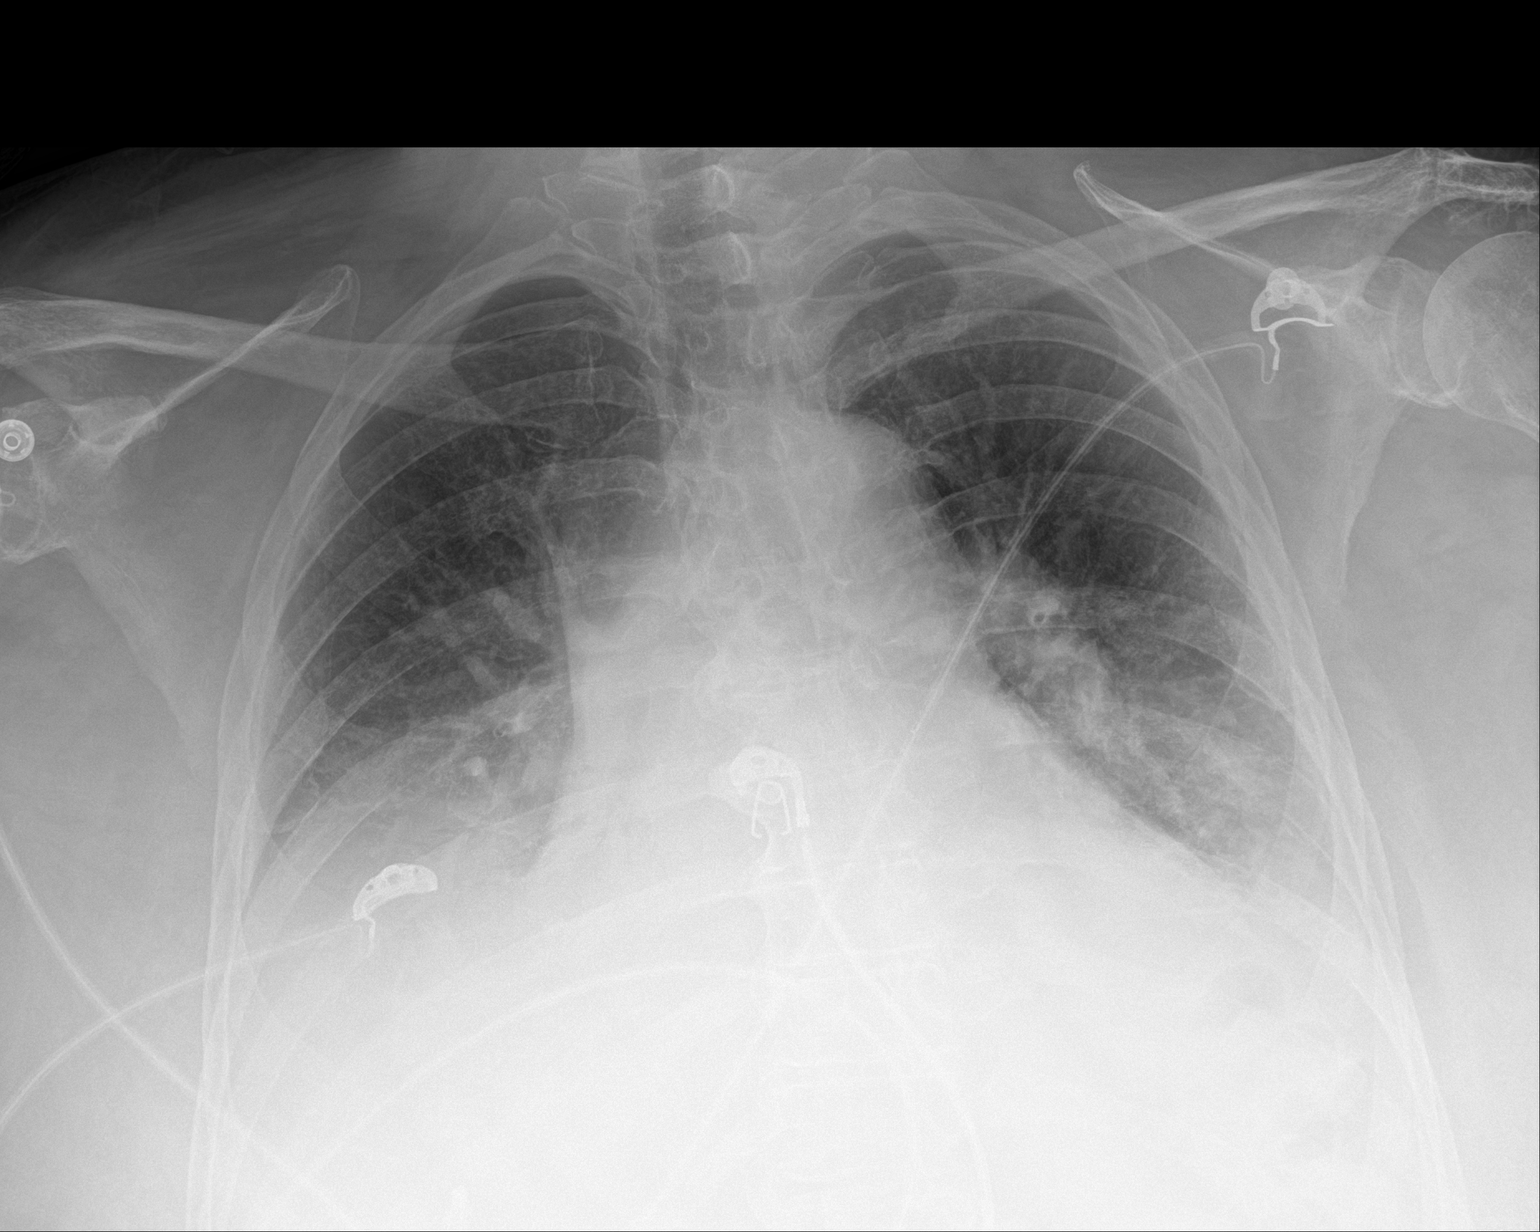

[1 of 1 positions shown; findings below may reference images not displayed]

FINDINGS: Again noted are bibasilar chest densities. Slightly increased
densities at the left lung base. Heart appears to be enlarged but
may be accentuated by the AP portable technique. Negative for
pneumothorax. Degenerative changes at the left AC joint.
IMPRESSION: Persistent bibasilar chest densities with slightly increased disease
at the left lung base. Findings could represent atelectasis or
infection.

## 2021-04-21 MED ORDER — SENNOSIDES-DOCUSATE SODIUM 8.6-50 MG PO TABS
2.0000 | ORAL_TABLET | Freq: Every day | ORAL | Status: DC
  Administered 2021-04-22 – 2021-04-24 (×3): 2 via ORAL
  Filled 2021-04-21 (×3): qty 2

## 2021-04-21 MED ORDER — LINAGLIPTIN 5 MG PO TABS
5.0000 mg | ORAL_TABLET | Freq: Every day | ORAL | Status: DC
Start: 2021-04-21 — End: 2021-04-25
  Administered 2021-04-21 – 2021-04-24 (×4): 5 mg via ORAL
  Filled 2021-04-21 (×5): qty 1

## 2021-04-21 MED ORDER — TAMSULOSIN HCL 0.4 MG PO CAPS
0.4000 mg | ORAL_CAPSULE | ORAL | Status: DC
  Administered 2021-04-22 – 2021-04-24 (×2): 0.4 mg via ORAL
  Filled 2021-04-21 (×3): qty 1

## 2021-04-21 MED ORDER — DIVALPROEX SODIUM 500 MG PO DR TAB
750.0000 mg | DELAYED_RELEASE_TABLET | Freq: Two times a day (BID) | ORAL | Status: DC
  Administered 2021-04-21 – 2021-04-24 (×7): 750 mg via ORAL
  Filled 2021-04-21 (×8): qty 1

## 2021-04-21 MED ORDER — METOPROLOL TARTRATE 25 MG PO TABS: 12.5000 mg | ORAL_TABLET | Freq: Two times a day (BID) | ORAL | Status: DC

## 2021-04-21 MED ORDER — FERROUS SULFATE 325 (65 FE) MG PO TABS
324.0000 mg | ORAL_TABLET | Freq: Every day | ORAL | Status: DC
  Administered 2021-04-21 – 2021-04-24 (×4): 324 mg via ORAL
  Filled 2021-04-21 (×5): qty 1

## 2021-04-21 MED ORDER — PANTOPRAZOLE SODIUM 40 MG PO TBEC
40.0000 mg | DELAYED_RELEASE_TABLET | Freq: Every day | ORAL | Status: DC
  Administered 2021-04-21 – 2021-04-24 (×4): 40 mg via ORAL
  Filled 2021-04-21 (×4): qty 1

## 2021-04-21 MED ORDER — ALBUTEROL SULFATE (2.5 MG/3ML) 0.083% IN NEBU: 2.5000 mg | INHALATION_SOLUTION | Freq: Four times a day (QID) | RESPIRATORY_TRACT | Status: DC

## 2021-04-21 MED ORDER — DIPHENHYDRAMINE-ZINC ACETATE 2-0.1 % EX CREA
TOPICAL_CREAM | Freq: Three times a day (TID) | CUTANEOUS | Status: DC | PRN
  Filled 2021-04-21 (×2): qty 28

## 2021-04-21 MED ORDER — RISPERIDONE 1 MG PO TBDP
2.0000 mg | ORAL_TABLET | Freq: Two times a day (BID) | ORAL | Status: DC
  Administered 2021-04-22 – 2021-05-05 (×25): 2 mg via ORAL
  Filled 2021-04-21: qty 1
  Filled 2021-04-21 (×3): qty 2
  Filled 2021-04-21 (×11): qty 1
  Filled 2021-04-21: qty 2
  Filled 2021-04-21 (×2): qty 1
  Filled 2021-04-21: qty 2
  Filled 2021-04-21 (×11): qty 1

## 2021-04-21 MED ORDER — IPRATROPIUM-ALBUTEROL 0.5-2.5 (3) MG/3ML IN SOLN
3.0000 mL | Freq: Four times a day (QID) | RESPIRATORY_TRACT | Status: DC
  Administered 2021-04-21 – 2021-04-22 (×2): 3 mL via RESPIRATORY_TRACT
  Filled 2021-04-21 (×3): qty 3

## 2021-04-21 MED ORDER — ATORVASTATIN CALCIUM 10 MG PO TABS
10.0000 mg | ORAL_TABLET | Freq: Every day | ORAL | Status: DC
  Administered 2021-04-22 – 2021-04-24 (×3): 10 mg via ORAL
  Filled 2021-04-21 (×4): qty 1

## 2021-04-21 MED ORDER — POLYETHYLENE GLYCOL 3350 17 G PO PACK
17.0000 g | PACK | Freq: Every day | ORAL | Status: DC
  Administered 2021-04-23 – 2021-05-05 (×8): 17 g via ORAL
  Filled 2021-04-21 (×11): qty 1

## 2021-04-21 MED ORDER — ALBUTEROL SULFATE (2.5 MG/3ML) 0.083% IN NEBU
5.0000 mg | INHALATION_SOLUTION | Freq: Once | RESPIRATORY_TRACT | Status: AC
  Administered 2021-04-21: 5 mg via RESPIRATORY_TRACT
  Filled 2021-04-21: qty 6

## 2021-04-21 MED ORDER — EMPAGLIFLOZIN 10 MG PO TABS
10.0000 mg | ORAL_TABLET | Freq: Every day | ORAL | Status: DC
  Administered 2021-04-22 – 2021-04-24 (×3): 10 mg via ORAL
  Filled 2021-04-21 (×5): qty 1

## 2021-04-21 MED ORDER — INSULIN ASPART 100 UNIT/ML IJ SOLN
0.0000 [IU] | Freq: Three times a day (TID) | INTRAMUSCULAR | Status: DC
  Administered 2021-04-22: 1 [IU] via SUBCUTANEOUS
  Administered 2021-04-22: 5 [IU] via SUBCUTANEOUS
  Administered 2021-04-22: 2 [IU] via SUBCUTANEOUS
  Administered 2021-04-23: 3 [IU] via SUBCUTANEOUS
  Administered 2021-04-23 – 2021-04-24 (×3): 2 [IU] via SUBCUTANEOUS
  Administered 2021-04-24: 3 [IU] via SUBCUTANEOUS
  Administered 2021-04-25: 2 [IU] via SUBCUTANEOUS

## 2021-04-21 MED ORDER — HALOPERIDOL LACTATE 5 MG/ML IJ SOLN
5.0000 mg | Freq: Four times a day (QID) | INTRAMUSCULAR | Status: AC | PRN
  Administered 2021-04-21 – 2021-04-22 (×3): 5 mg via INTRAVENOUS
  Filled 2021-04-21 (×3): qty 1

## 2021-04-21 MED ORDER — APIXABAN 5 MG PO TABS
5.0000 mg | ORAL_TABLET | Freq: Two times a day (BID) | ORAL | Status: DC
  Administered 2021-04-21 – 2021-04-24 (×7): 5 mg via ORAL
  Filled 2021-04-21 (×7): qty 1

## 2021-04-21 MED ORDER — DIPHENHYDRAMINE HCL 2 % EX CREA: 1.0000 "application " | TOPICAL_CREAM | Freq: Three times a day (TID) | CUTANEOUS | Status: DC | PRN

## 2021-04-21 MED ORDER — SPIRONOLACTONE 25 MG PO TABS
25.0000 mg | ORAL_TABLET | ORAL | Status: DC
  Administered 2021-04-22 – 2021-04-24 (×2): 25 mg via ORAL
  Filled 2021-04-21 (×3): qty 1

## 2021-04-21 NOTE — Progress Notes (Signed)
Pt arrived to 2w31 from ED via bed. Received report from [name]. Pt oriented to self only, IV x 1, [place] 2L, purewick in place. Pt unable to be oriented to unit. Call bell given to pt and bed alarm turned on. Provider notified of pt's arrival.

## 2021-04-21 NOTE — ED Triage Notes (Signed)
Pt BIB [facility] from [facility] c/o unresponsiveness. Pt was just discharged from hospital yesterday pt was intubated and extubated, pt is suppose to wear BIPAP at night but refuses. Staff found pt unresponsive this morning. O2 was 85% on RA. Pt was placed on 15L NRB. 20g L forearm.

## 2021-04-21 NOTE — H&P (Signed)
History and Physical    Becky Hendricks WNU:[id number] DOB: 01/06/1946 DOA: 04/21/2021  PCP: Roderic Scarce, MD (Confirm with patient/family/NH records and if not entered, this has to be entered at [facility] point of entry) Patient coming from: SNF  I have personally briefly reviewed patient's old medical records in [facility]  Chief Complaint: I want to go home  HPI: Becky Hendricks is a 74 y.o. female with medical history significant of chronic hypoxic hypercapnic respiratory failure secondary to OSA, noncompliant with BiPAP at night, morbid obesity, IDDM, HTN, PAF on Eliquis, bipolar disorder, sent from nursing home for AMS.  Patient was recently hospitalized 2 times in one month for similar condition of hypoxic hypercapnic respiratory failure all because that patient refused to wear BIPAP, including one recent hospital stay when patient was intubated and extubated and discharged two days ago. This morning, patient was found obtunded in her bed, obviously not wearing BIPAP.  ED Course: Showed decompensated acute on chronic hypercapnic respiratory failure: 7.29/100/71, after two hours of BIPAP, improved to 7.32/95, patient more awake. Chest xray shows similar B/L lower fields changes.  Patient then became agitated and threatening to leave AMA.  Review of Systems: Unable to perform, patient agitated.  Past Medical History:  Diagnosis Date   Atrial fibrillation (HCC)    Bipolar disorder (HCC)    Bipolar disorder (HCC) 04/27/2020   Clostridium difficile diarrhea 08/02/2019   Dehydration    Essential hypertension    Fall 02/11/2020   Morbid obesity (HCC)    Osteomyelitis (HCC) 12/03/2019   Pneumonia due to COVID-19 virus 09/15/2019   Type 2 diabetes mellitus (HCC)    Weakness     Past Surgical History:  Procedure Laterality Date   INCISION AND DRAINAGE PERIRECTAL ABSCESS Left 12/04/2019   Procedure: IRRIGATION AND DEBRIDEMENT LEFT BUTTOCK ABSCESS;  Surgeon: Violeta Gelinas, MD;  [facility]  OR;  Service: General;  Laterality: Left;   INCISION AND DRAINAGE PERIRECTAL ABSCESS Left 12/10/2019   Procedure: REPEAT IRRIGATION AND DEBRIDEMENT BUTTOCK  ABSCESS;  Surgeon: Abigail Miyamoto, MD;  Location: [facility] OR;  Service: General;  Laterality: Left;   IR FLUORO GUIDE CV LINE RIGHT  11/25/2020   IR RADIOLOGIST EVAL & MGMT  12/29/2020   IR US GUIDE VASC ACCESS RIGHT  11/25/2020     reports that she has never smoked. She has never used smokeless tobacco. She reports that she does not currently use alcohol. She reports that she does not currently use drugs.  Allergies  Allergen Reactions   Chlorhexidine Gluconate Itching   Vancomycin     Vancomycin infusion related reaction- May need Vanc to be given at a slower rate    Acetazolamide Er Rash    Family History  Family history unknown: Yes     Prior to Admission medications   Medication Sig Start Date End Date Taking? Authorizing Provider  apixaban (ELIQUIS) 5 MG TABS tablet Take 5 mg by mouth 2 (two) times daily.   Yes [provider]  atorvastatin (LIPITOR) 10 MG tablet Take 10 mg by mouth at bedtime.   Yes [provider]  calcium carbonate (TUMS - DOSED IN MG ELEMENTAL CALCIUM) 500 MG chewable tablet Chew 500 mg by mouth daily.   Yes [provider]  diphenhydrAMINE (BENADRYL) 2 % cream Apply 1 application topically 3 (three) times daily as needed for itching.   Yes [provider]  divalproex (DEPAKOTE) 250 MG DR tablet Take 3 tablets (750 mg total) by mouth 2 (two) times  daily. 04/19/21  Yes Esaw Grandchild A, DO  docusate sodium (COLACE) 100 MG capsule Take 1 capsule (100 mg total) by mouth 2 (two) times daily as needed for mild constipation. 01/05/20  Yes Regalado, Belkys A, MD  empagliflozin (JARDIANCE) 10 MG TABS tablet Take 10 mg by mouth daily.   Yes [provider]  ferrous sulfate 324 MG TBEC Take 324 mg by mouth daily.   Yes [provider]  insulin lispro (HUMALOG) 100  UNIT/ML injection Inject 2-12 Units into the skin in the morning and at bedtime. CBG 70 - 200: 0 units CBG 201 - 250: 2 units CBG 251 - 300: 4 units CBG 301 - 350: 6 units CBG 351 - 400: 8 units CBG 401 - 450: 10 units CBG 451 - 600: 12 units If CBG is > 450, give 12 units, recheck in 2 hours. If still >350, notify provider   Yes [provider]  Ipratropium-Albuterol (COMBIVENT RESPIMAT) 20-100 MCG/ACT AERS respimat Inhale 1 puff into the lungs every 12 (twelve) hours.   Yes [provider]  JANUVIA 50 MG tablet Take 50 mg by mouth daily. 03/27/21  Yes [provider]  loperamide (IMODIUM) 2 MG capsule Take 1 capsule (2 mg total) by mouth as needed for diarrhea or loose stools. 04/19/21  Yes Esaw Grandchild A, DO  metoprolol tartrate (LOPRESSOR) 25 MG tablet Take 0.5 tablets (12.5 mg total) by mouth 2 (two) times daily. Hold if SBP<110 or DBP<60 04/19/21  Yes Esaw Grandchild A, DO  Multiple Vitamin (MULTIVITAMIN WITH MINERALS) TABS tablet Take 1 tablet by mouth daily.   Yes [provider]  pantoprazole (PROTONIX) 40 MG tablet Take 1 tablet (40 mg total) by mouth daily. 04/07/21  Yes Glade Lloyd, MD  polyethylene glycol (MIRALAX / GLYCOLAX) 17 g packet Take 17 g by mouth daily. 04/07/21  Yes Glade Lloyd, MD  risperiDONE (RISPERDAL M-TABS) 1 MG disintegrating tablet Take 1 tablet (1 mg total) by mouth daily. 04/19/21  Yes Esaw Grandchild A, DO  risperiDONE (RISPERDAL M-TABS) 2 MG disintegrating tablet Take 1 tablet (2 mg total) by mouth 2 (two) times daily. 04/19/21  Yes Esaw Grandchild A, DO  sennosides-docusate sodium (SENOKOT-S) 8.6-50 MG tablet Take 2 tablets by mouth daily. Hold if having loose or frequent stools 04/19/21  Yes Esaw Grandchild A, DO  spironolactone (ALDACTONE) 25 MG tablet Take 25 mg by mouth every other day.   Yes [provider]  tamsulosin (FLOMAX) 0.4 MG CAPS capsule Take 1 capsule (0.4 mg total) by mouth every other day. 04/20/21  Yes  Pennie Banter, DO    Physical Exam: Vitals:   04/21/21 1333 04/21/21 1334 04/21/21 1335 04/21/21 1336  BP:      Pulse: (!) 52 (!) 51 (!) 53 (!) 46  Resp: 13 16 (!) 22 14  Temp:      TempSrc:      SpO2: 100% 100% 99% 99%    Constitutional: NAD, calm, comfortable Vitals:   04/21/21 1333 04/21/21 1334 04/21/21 1335 04/21/21 1336  BP:      Pulse: (!) 52 (!) 51 (!) 53 (!) 46  Resp: 13 16 (!) 22 14  Temp:      TempSrc:      SpO2: 100% 100% 99% 99%   Eyes: PERRL, lids and conjunctivae normal ENMT: Mucous membranes are moist. Posterior pharynx clear of any exudate or lesions.Normal dentition.  Neck: normal, supple, no masses, no thyromegaly Respiratory: clear to auscultation bilaterally, no wheezing,  no crackles. Normal respiratory effort. No accessory muscle use.  Cardiovascular: Regular rate and rhythm, no murmurs / rubs / gallops. No extremity edema. 2+ pedal pulses. No carotid bruits.  Abdomen: no tenderness, no masses palpated. No hepatosplenomegaly. Bowel sounds positive.  Musculoskeletal: no clubbing / cyanosis. No joint deformity upper and lower extremities. Good ROM, no contractures. Normal muscle tone.  Skin: no rashes, lesions, ulcers. No induration Neurologic: CN 2-12 grossly intact. Sensation intact, DTR normal. Strength 5/5 in all 4.  Psychiatric: Normal judgment and insight. Alert and oriented x 3. Normal mood.     Labs on Admission: I have personally reviewed following labs and imaging studies  CBC: Recent Labs  Lab 04/16/21 0225 04/21/21 0944 04/21/21 0956 04/21/21 1420  WBC 9.1 6.6  --   --   HGB 9.3* 9.9* 10.2* 10.2*  HCT 31.9* 34.1* 30.0* 30.0*  MCV 95.5 97.2  --   --   PLT 133* 157  --   --    Basic Metabolic Panel: Recent Labs  Lab 04/16/21 0225 04/21/21 0956 04/21/21 1420  NA 138 140 141  K 4.8 4.4 4.1  CL 96*  --   --   CO2 38*  --   --   GLUCOSE 116*  --   --   BUN 11  --   --   CREATININE 0.64  --   --   CALCIUM 8.1*  --   --     GFR: Estimated Creatinine Clearance: 77 mL/min (by C-G formula based on SCr of 0.64 mg/dL). Liver Function Tests: No results for input(s): AST, ALT, ALKPHOS, BILITOT, PROT, ALBUMIN in the last 168 hours. No results for input(s): LIPASE, AMYLASE in the last 168 hours. Recent Labs  Lab 04/14/21 2139  AMMONIA 12   Coagulation Profile: No results for input(s): INR, PROTIME in the last 168 hours. Cardiac Enzymes: No results for input(s): CKTOTAL, CKMB, CKMBINDEX, TROPONINI in the last 168 hours. BNP (last 3 results) No results for input(s): PROBNP in the last 8760 hours. HbA1C: No results for input(s): HGBA1C in the last 72 hours. CBG: Recent Labs  Lab 04/18/21 2120 04/19/21 0107 04/19/21 0547 04/19/21 1107 04/19/21 2039  GLUCAP 167* 136* 122* 101* 171*   Lipid Profile: No results for input(s): CHOL, HDL, LDLCALC, TRIG, CHOLHDL, LDLDIRECT in the last 72 hours. Thyroid Function Tests: No results for input(s): TSH, T4TOTAL, FREET4, T3FREE, THYROIDAB in the last 72 hours. Anemia Panel: No results for input(s): VITAMINB12, FOLATE, FERRITIN, TIBC, IRON, RETICCTPCT in the last 72 hours. Urine analysis:    Component Value Date/Time   COLORURINE AMBER (A) 04/11/2021 0915   APPEARANCEUR CLOUDY (A) 04/11/2021 0915   LABSPEC 1.021 04/11/2021 0915   PHURINE 7.0 04/11/2021 0915   GLUCOSEU NEGATIVE 04/11/2021 0915   HGBUR MODERATE (A) 04/11/2021 0915   BILIRUBINUR NEGATIVE 04/11/2021 0915   KETONESUR 5 (A) 04/11/2021 0915   PROTEINUR 100 (A) 04/11/2021 0915   NITRITE NEGATIVE 04/11/2021 0915   LEUKOCYTESUR LARGE (A) 04/11/2021 0915    Radiological Exams on Admission: DG Chest Port 1 View  Result Date: 04/21/2021 CLINICAL DATA:  Shortness of breath.  Respiratory distress. EXAM: PORTABLE CHEST 1 VIEW COMPARISON:  04/12/2021 FINDINGS: Again noted are bibasilar chest densities. Slightly increased densities at the left lung base. Heart appears to be enlarged but may be accentuated by  the AP portable technique. Negative for pneumothorax. Degenerative changes at the left [facility] joint. IMPRESSION: Persistent bibasilar chest densities with slightly increased disease at the left lung  base. Findings could represent atelectasis or infection. Electronically Signed   By: Richarda OverlieAdam  Henn M.D.   On: 04/21/2021 11:46    EKG: Independently reviewed. Sinus, no acute ST-T changes.  Assessment/Plan Active Problems:   Acute on chronic respiratory failure with hypoxia and hypercapnia (HCC)   Respiratory failure (HCC)  (please populate well all problems here in Problem List. (For example, if patient is on BP meds at home and you resume or decide to hold them, it is a problem that needs to be her. Same for CAD, COPD, HLD and so on)  Acute metabolic encephalopathy -Significant for acute on chronic hypercapnic respiratory respiratory acidosis.  [facility] On last admission, I had a prolonged discussion with patient herself and her brother over the phone, regarding prognosis, treatment options and goal of care. At this moment, patient is agitated and unable to have a sensible conversation regarding her care. Will try to reach her brother. Full Code for now and admit to PCU, short term and long term prognosis poor given repeated failure and non-compliant with BIPAP, significant risk of sudden death. -BIPAP as tolerated. -Non-benzos for agitation. -All of her symptoms likely from hypercapnic issue, and her chest xray showed chronic changes, and she received ABX on both admissions, will not initiate this time. -Allergic to Diamox  Acute on chronic hypercapnic respiratory failure/respiratory acidosis -As above  IDDM  -Sliding scale, continue Januvia  HTN -Hold beta blocker for bradycardia  Bradycardia -hold beta blocker and re-evaluate.  PAF -Continue Eliquis  DVT prophylaxis: Eliquis Code Status: Full Code Family Communication: None at bedside Disposition Plan: Expect  more than 2 midnight hospital stay Consults called: None Admission status: PCU   Emeline GeneralPing T Jaiel Saraceno MD [facility] Pager [phone]  04/21/2021, 2:50 PM

## 2021-04-21 NOTE — ED Notes (Signed)
Attempted report X2 

## 2021-04-21 NOTE — ED Provider Notes (Addendum)
[facility] EMERGENCY DEPARTMENT Provider Note   CSN: [id number] Arrival date & time: 04/21/21  0919     History Chief Complaint  Patient presents with   unresponsive   Shortness of Breath    Luvern Mischke is a 75 y.o. female.  Patient with hx bipolar, OSA, afib, presents with altered mental status. Recently admission for acute/chronic respiratory failure with elevated co2/altered ms - responded to bipap. Pt not compliant w bipap after d/c to SNF yesterday. Pt not verbally responsive to questions - level 5 caveat. No report of fever. No report of trauma/fall.   The history is provided by the patient and medical records. The history is limited by the condition of the patient.  Shortness of Breath     Past Medical History:  Diagnosis Date   Atrial fibrillation (HCC)    Bipolar disorder (HCC)    Bipolar disorder (HCC) 04/27/2020   Clostridium difficile diarrhea 08/02/2019   Dehydration    Essential hypertension    Fall 02/11/2020   Morbid obesity (HCC)    Osteomyelitis (HCC) 12/03/2019   Pneumonia due to COVID-19 virus 09/15/2019   Type 2 diabetes mellitus (HCC)    Weakness     Patient Active Problem List   Diagnosis Date Noted   Acute on chronic respiratory failure (HCC) 04/10/2021   Acute on chronic respiratory failure with hypoxia and hypercapnia (HCC)    Acute respiratory failure with hypoxia (HCC) 03/29/2021   Hyponatremia 03/29/2021   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   Pneumonia of lower lobe due to infectious organism 03/29/2021   Colitis 03/29/2021   Acute respiratory failure (HCC) 03/29/2021   Bipolar affective disorder, currently manic, mild (HCC) 11/19/2020   Constipation 11/18/2020   Encephalopathy 11/18/2020   MRSA bacteremia    SIRS (systemic inflammatory response syndrome) (HCC) 11/17/2020   Nausea and vomiting 11/17/2020   Mixed diabetic hyperlipidemia associated with type 2 diabetes mellitus (HCC) 11/17/2020   Type 2 diabetes  mellitus with hyperglycemia, with long-term current use of insulin (HCC) 11/17/2020   Immunization due 07/26/2020   Psychosis (HCC)    Bipolar disorder (HCC) 04/27/2020   Fall 02/11/2020   Chronic diastolic CHF (congestive heart failure) (HCC) 01/22/2020   Drug rash    Acute osteomyelitis of pelvic region and thigh (HCC)    Palliative care by specialist    Goals of care, counseling/discussion    Edema of upper extremity    Hyperphosphatemia    Cardiac arrest (HCC)    Sacral wound    Hypoxia    Severe sepsis (HCC) 12/03/2019   Abscess of multiple sites of buttock 12/03/2019   AF (paroxysmal atrial fibrillation) (HCC) 12/03/2019   Metabolic encephalopathy 09/15/2019   Urinary tract infection without hematuria 09/15/2019   Pressure injury of skin 09/10/2019   Morbid obesity with BMI of 40.0-44.9, adult (HCC) 02/27/2018    Past Surgical History:  Procedure Laterality Date   INCISION AND DRAINAGE PERIRECTAL ABSCESS Left 12/04/2019   Procedure: IRRIGATION AND DEBRIDEMENT LEFT BUTTOCK ABSCESS;  Surgeon: Violeta Gelinas, MD;  Location: [facility] OR;  Service: General;  Laterality: Left;   INCISION AND DRAINAGE PERIRECTAL ABSCESS Left 12/10/2019   Procedure: REPEAT IRRIGATION AND DEBRIDEMENT BUTTOCK  ABSCESS;  Surgeon: Abigail Miyamoto, MD;  Location: [facility] OR;  Service: General;  Laterality: Left;   IR FLUORO GUIDE CV LINE RIGHT  11/25/2020   IR RADIOLOGIST EVAL & MGMT  12/29/2020   IR US GUIDE VASC ACCESS RIGHT  11/25/2020     OB  History   No obstetric history on file.     Family History  Family history unknown: Yes    Social History   Tobacco Use   Smoking status: Never   Smokeless tobacco: Never  Vaping Use   Vaping Use: Unknown  Substance Use Topics   Alcohol use: Not Currently   Drug use: Not Currently    Home Medications Prior to Admission medications   Medication Sig Start Date End Date Taking? Authorizing Provider  apixaban (ELIQUIS) 5 MG TABS tablet Take 5 mg by mouth 2  (two) times daily.   Yes [provider]  atorvastatin (LIPITOR) 10 MG tablet Take 10 mg by mouth at bedtime.   Yes [provider]  calcium carbonate (TUMS - DOSED IN MG ELEMENTAL CALCIUM) 500 MG chewable tablet Chew 500 mg by mouth daily.   Yes [provider]  diphenhydrAMINE (BENADRYL) 2 % cream Apply 1 application topically 3 (three) times daily as needed for itching.   Yes [provider]  divalproex (DEPAKOTE) 250 MG DR tablet Take 3 tablets (750 mg total) by mouth 2 (two) times daily. 04/19/21  Yes Esaw Grandchild A, DO  docusate sodium (COLACE) 100 MG capsule Take 1 capsule (100 mg total) by mouth 2 (two) times daily as needed for mild constipation. 01/05/20  Yes Regalado, Belkys A, MD  empagliflozin (JARDIANCE) 10 MG TABS tablet Take 10 mg by mouth daily.   Yes [provider]  ferrous sulfate 324 MG TBEC Take 324 mg by mouth daily.   Yes [provider]  insulin lispro (HUMALOG) 100 UNIT/ML injection Inject 2-12 Units into the skin in the morning and at bedtime. CBG 70 - 200: 0 units CBG 201 - 250: 2 units CBG 251 - 300: 4 units CBG 301 - 350: 6 units CBG 351 - 400: 8 units CBG 401 - 450: 10 units CBG 451 - 600: 12 units If CBG is > 450, give 12 units, recheck in 2 hours. If still >350, notify provider   Yes [provider]  Ipratropium-Albuterol (COMBIVENT RESPIMAT) 20-100 MCG/ACT AERS respimat Inhale 1 puff into the lungs every 12 (twelve) hours.   Yes [provider]  JANUVIA 50 MG tablet Take 50 mg by mouth daily. 03/27/21  Yes [provider]  loperamide (IMODIUM) 2 MG capsule Take 1 capsule (2 mg total) by mouth as needed for diarrhea or loose stools. 04/19/21  Yes Esaw Grandchild A, DO  metoprolol tartrate (LOPRESSOR) 25 MG tablet Take 0.5 tablets (12.5 mg total) by mouth 2 (two) times daily. Hold if SBP<110 or DBP<60 04/19/21  Yes Esaw Grandchild A, DO  Multiple Vitamin (MULTIVITAMIN WITH MINERALS) TABS  tablet Take 1 tablet by mouth daily.   Yes [provider]  pantoprazole (PROTONIX) 40 MG tablet Take 1 tablet (40 mg total) by mouth daily. 04/07/21  Yes Glade Lloyd, MD  polyethylene glycol (MIRALAX / GLYCOLAX) 17 g packet Take 17 g by mouth daily. 04/07/21  Yes Glade Lloyd, MD  risperiDONE (RISPERDAL M-TABS) 1 MG disintegrating tablet Take 1 tablet (1 mg total) by mouth daily. 04/19/21  Yes Esaw Grandchild A, DO  risperiDONE (RISPERDAL M-TABS) 2 MG disintegrating tablet Take 1 tablet (2 mg total) by mouth 2 (two) times daily. 04/19/21  Yes Esaw Grandchild A, DO  sennosides-docusate sodium (SENOKOT-S) 8.6-50 MG tablet Take 2 tablets by mouth daily. Hold if having loose or frequent stools 04/19/21  Yes Esaw Grandchild A, DO  spironolactone (ALDACTONE) 25 MG tablet Take  25 mg by mouth every other day.   Yes [provider]  tamsulosin (FLOMAX) 0.4 MG CAPS capsule Take 1 capsule (0.4 mg total) by mouth every other day. 04/20/21  Yes Esaw Grandchild A, DO    Allergies    Chlorhexidine gluconate, Vancomycin, and Acetazolamide er  Review of Systems   Review of Systems  Unable to perform ROS: Mental status change  Respiratory:  Positive for shortness of breath.   Level 5 caveat - pt unresponsive.    Physical Exam Updated Vital Signs BP (!) 123/56   Pulse (!) 52   Temp 99.3 F (37.4 C) (Oral)   Resp 14   LMP  (LMP Unknown)   SpO2 100%   Physical Exam Vitals and nursing note reviewed.  Constitutional:      Appearance: Normal appearance. She is well-developed.  HENT:     Head: Atraumatic.     Nose: Nose normal.     Mouth/Throat:     Mouth: Mucous membranes are moist.  Eyes:     General: No scleral icterus.    Conjunctiva/sclera: Conjunctivae normal.     Pupils: Pupils are equal, round, and reactive to light.  Neck:     Vascular: No carotid bruit.     Trachea: No tracheal deviation.     Comments: No stiffness or rigidity.  Cardiovascular:     Rate and Rhythm:  Normal rate and regular rhythm.     Pulses: Normal pulses.     Heart sounds: Normal heart sounds. No murmur heard.   No friction rub. No gallop.  Pulmonary:     Effort: Pulmonary effort is normal. No respiratory distress.     Breath sounds: Normal breath sounds.  Abdominal:     General: Bowel sounds are normal. There is no distension.     Palpations: Abdomen is soft.     Tenderness: There is no abdominal tenderness.  Genitourinary:    Comments: No cva tenderness.  Musculoskeletal:        General: No swelling or tenderness.     Cervical back: Normal range of motion and neck supple. No rigidity. No muscular tenderness.     Right lower leg: No edema.     Left lower leg: No edema.  Skin:    General: Skin is warm and dry.     Findings: No rash.  Neurological:     Mental Status: She is alert.     Comments: Pt altered, lethargic. Does move extremities purposefully. Does not follow commands.   Psychiatric:        Mood and Affect: Mood normal.    ED Results / Procedures / Treatments   Labs (all labs ordered are listed, but only abnormal results are displayed) Results for orders placed or performed during the hospital encounter of 04/21/21  Brain natriuretic peptide  Result Value Ref Range   B Natriuretic Peptide 154.5 (H) 0.0 - 100.0 pg/mL  CBC  Result Value Ref Range   WBC 6.6 4.0 - 10.5 K/uL   RBC 3.51 (L) 3.87 - 5.11 MIL/uL   Hemoglobin 9.9 (L) 12.0 - 15.0 g/dL   HCT 16.1 (L) 09.6 - 04.5 %   MCV 97.2 80.0 - 100.0 fL   MCH 28.2 26.0 - 34.0 pg   MCHC 29.0 (L) 30.0 - 36.0 g/dL   RDW 40.9 81.1 - 91.4 %   Platelets 157 150 - 400 K/uL   nRBC 0.0 0.0 - 0.2 %  I-Stat arterial blood gas, ED  Result Value  Ref Range   pH, Arterial 7.293 (L) 7.350 - 7.450   pCO2 arterial 100.3 (HH) 32.0 - 48.0 mmHg   pO2, Arterial 71 (L) 83.0 - 108.0 mmHg   Bicarbonate 48.4 (H) 20.0 - 28.0 mmol/L   TCO2 >50 (H) 22 - 32 mmol/L   O2 Saturation 90.0 %   Acid-Base Excess 18.0 (H) 0.0 - 2.0 mmol/L    Sodium 140 135 - 145 mmol/L   Potassium 4.4 3.5 - 5.1 mmol/L   Calcium, Ion 1.22 1.15 - 1.40 mmol/L   HCT 30.0 (L) 36.0 - 46.0 %   Hemoglobin 10.2 (L) 12.0 - 15.0 g/dL   Patient temperature 24.2 F    Collection site Radial    Drawn by RT    Sample type ARTERIAL    Comment NOTIFIED PHYSICIAN    DG Chest 2 View  Result Date: 03/29/2021 CLINICAL DATA:  Fatigue and dyspnea EXAM: CHEST - 2 VIEW COMPARISON:  11/27/2020 FINDINGS: Cardiac shadow is mildly prominent but stable. Previously seen right jugular catheter is been removed in the interval. The overall inspiratory effort is poor. Mild central vascular congestion is noted without definitive edema. No sizable effusion is seen. IMPRESSION: Poor inspiratory effort. Mild vascular congestion without edema. Electronically Signed   By: Alcide Clever M.D.   On: 03/29/2021 17:28   DG Abd 1 View  Result Date: 03/29/2021 CLINICAL DATA:  OG tube positioning. EXAM: ABDOMEN - 1 VIEW COMPARISON:  None. FINDINGS: An orogastric tube is seen with its distal tip overlying the expected region of the body of the stomach (overlying the superior endplate of the L2 vertebral body). The bowel gas pattern is normal. Radiopaque contrast is seen within the bilateral renal collecting systems. IMPRESSION: Orogastric tube positioning, as described above. Electronically Signed   By: Aram Candela M.D.   On: 03/29/2021 22:23   CT Head Wo Contrast  Result Date: 04/10/2021 CLINICAL DATA:  Altered mental status.  Unable to move right arm. EXAM: CT HEAD WITHOUT CONTRAST TECHNIQUE: Contiguous axial images were obtained from the base of the skull through the vertex without intravenous contrast. COMPARISON:  04/02/2021 FINDINGS: Brain: There is no evidence for acute hemorrhage, hydrocephalus, mass lesion, or abnormal extra-axial fluid collection. No definite CT evidence for acute infarction. Diffuse loss of parenchymal volume is consistent with atrophy. Patchy low attenuation in the  deep hemispheric and periventricular white matter is nonspecific, but likely reflects chronic microvascular ischemic demyelination. Vascular: No hyperdense vessel or unexpected calcification. Skull: No evidence for fracture. No worrisome lytic or sclerotic lesion. Sinuses/Orbits: Tiny air-fluid level in the right sphenoid sinus is similar in the interval. Visualized portions of the globes and intraorbital fat are unremarkable. Other: None. IMPRESSION: 1. No acute intracranial abnormality. 2. Atrophy with chronic small vessel white matter ischemic disease. 3. Stable tiny air-fluid level in the right sphenoid sinus. Acute sinusitis would be a consideration. Electronically Signed   By: Kennith Center M.D.   On: 04/10/2021 12:03   CT HEAD WO CONTRAST ( )  Result Date: 04/02/2021 CLINICAL DATA:  TIA. Increased altered mental status and asymmetric pupils. EXAM: CT HEAD WITHOUT CONTRAST TECHNIQUE: Contiguous axial images were obtained from the base of the skull through the vertex without intravenous contrast. COMPARISON:  03/29/2021 FINDINGS: Brain: No evidence of acute infarction, hemorrhage, hydrocephalus, extra-axial collection or mass lesion/mass effect. Generalized atrophy. Vascular: No hyperdense vessel or unexpected calcification. Skull: Normal. Negative for fracture or focal lesion. Sinuses/Orbits: Patchy opacification of paranasal sinuses, overall mild. IMPRESSION: No acute or  interval finding. Electronically Signed   By: Marnee Spring M.D.   On: 04/02/2021 09:53   CT HEAD WO CONTRAST ( )  Result Date: 03/29/2021 CLINICAL DATA:  Delirium, fatigue, altered mental status EXAM: CT HEAD WITHOUT CONTRAST TECHNIQUE: Contiguous axial images were obtained from the base of the skull through the vertex without intravenous contrast. COMPARISON:  11/21/2020 FINDINGS: Brain: No evidence of acute infarction, hemorrhage, hydrocephalus, extra-axial collection or mass lesion/mass effect. Periventricular white matter  changes, likely the sequela of chronic small vessel ischemic disease. Vascular: No hyperdense vessel or unexpected calcification. Skull: Normal. Negative for fracture or focal lesion. Sinuses/Orbits: Air-fluid level in the right sphenoid sinus. Mild mucosal thickening in the ethmoid air cells. The orbits are unremarkable. Other: The mastoids are clear. IMPRESSION: 1. No acute intracranial process. 2. Air-fluid level in the right sphenoid sinus, which can be seen in the setting of acute sinusitis. Electronically Signed   By: Wiliam Ke M.D.   On: 03/29/2021 16:56   MR BRAIN WO CONTRAST  Result Date: 04/02/2021 CLINICAL DATA:  Mental status change, unknown cause EXAM: MRI HEAD WITHOUT CONTRAST TECHNIQUE: Multiplanar, multiecho pulse sequences of the brain and surrounding structures were obtained without intravenous contrast. COMPARISON:  11/19/2020 FINDINGS: Brain: There is no acute infarction or intracranial hemorrhage. There is no intracranial mass, mass effect, or edema. There is no hydrocephalus or extra-axial fluid collection. Prominence of the ventricles and sulci reflects generalized parenchymal volume loss. Patchy T2 hyperintensity in the supratentorial white matter is nonspecific but may reflect minor chronic microvascular ischemic changes. Signal abnormality on the prior study has resolved. Vascular: Major vessel flow voids at the skull base are preserved. Skull and upper cervical spine: Normal marrow signal is preserved. Sinuses/Orbits: Mild mucosal thickening.  Orbits are unremarkable. Other: Sella is unremarkable. Mild patchy mastoid fluid opacification. IMPRESSION: No acute infarction, hemorrhage, or mass. Electronically Signed   By: Guadlupe Spanish M.D.   On: 04/02/2021 18:36   CT ABDOMEN PELVIS W CONTRAST  Result Date: 03/29/2021 CLINICAL DATA:  Acute generalized abdominal pain. EXAM: CT ABDOMEN AND PELVIS WITH CONTRAST TECHNIQUE: Multidetector CT imaging of the abdomen and pelvis was  performed using the standard protocol following bolus administration of intravenous contrast. CONTRAST:  65mL OMNIPAQUE IOHEXOL 350 MG/ML SOLN COMPARISON:  November 17, 2020. FINDINGS: Lower chest: Mild bilateral posterior basilar subsegmental atelectasis or infiltrates are noted with small bilateral pleural effusions. Hepatobiliary: Cholelithiasis is noted. No biliary dilatation is noted. The liver is unremarkable. Pancreas: Unremarkable. No pancreatic ductal dilatation or surrounding inflammatory changes. Spleen: Normal in size without focal abnormality. Adrenals/Urinary Tract: Adrenal glands appear normal. Small nonobstructive left renal calculus is noted. Right renal cyst is noted. No hydronephrosis or renal obstruction is noted. Urinary bladder is unremarkable. Stomach/Bowel: Stomach appears normal. There is no definite evidence of bowel obstruction. The appendix is not clearly visualized. Large amount of stool seen in the distal sigmoid colon and rectum with wall thickening suggesting some degree of proctocolitis. Vascular/Lymphatic: Aortic atherosclerosis. No enlarged abdominal or pelvic lymph nodes. Reproductive: Stable uterine fibroids. No adnexal abnormality is noted. Other: Small fat containing left inguinal hernia is noted. No ascites is noted. Musculoskeletal: No acute or significant osseous findings. IMPRESSION: Mild bilateral posterior basilar subsegmental atelectasis or infiltrates are noted with small bilateral pleural effusions. Cholelithiasis. Large amount of stool seen in distal sigmoid colon and rectum with wall thickening suggesting some degree of proctocolitis. Small nonobstructive left renal calculus. No hydronephrosis or renal obstruction is noted. Stable uterine fibroids. Small fat  containing left inguinal hernia. Aortic Atherosclerosis (ICD10-I70.0). Electronically Signed   By: Lupita Raider M.D.   On: 03/29/2021 18:40   DG Chest Port 1 View  Result Date: 04/21/2021 CLINICAL DATA:   Shortness of breath.  Respiratory distress. EXAM: PORTABLE CHEST 1 VIEW COMPARISON:  04/12/2021 FINDINGS: Again noted are bibasilar chest densities. Slightly increased densities at the left lung base. Heart appears to be enlarged but may be accentuated by the AP portable technique. Negative for pneumothorax. Degenerative changes at the left [facility] joint. IMPRESSION: Persistent bibasilar chest densities with slightly increased disease at the left lung base. Findings could represent atelectasis or infection. Electronically Signed   By: Richarda Overlie M.D.   On: 04/21/2021 11:46   DG CHEST PORT 1 VIEW  Result Date: 04/12/2021 CLINICAL DATA:  Acute respiratory failure with hypoxia. EXAM: PORTABLE CHEST 1 VIEW COMPARISON:  Chest x-ray 04/10/2021 FINDINGS: Heart is enlarged. Right greater than left lower lobe airspace disease and effusion again noted. Pulmonary congestion is stable. There is no significant interval change. IMPRESSION: 1. Stable appearance of right greater than left lower lobe airspace disease and effusion. 2. Stable cardiomegaly and pulmonary congestion. Electronically Signed   By: Marin Roberts M.D.   On: 04/12/2021 07:55   DG Chest Port 1 View  Result Date: 04/10/2021 CLINICAL DATA:  75 year old female with history of shortness of breath, weakness and fatigue. Elevated blood pressure. EXAM: PORTABLE CHEST 1 VIEW COMPARISON:  Chest x-ray 04/01/2021. FINDINGS: Chronic elevation of the right hemidiaphragm, similar to the prior study. Bibasilar opacities (right greater than left) which may reflect areas of atelectasis and/or consolidation. No definite pleural effusions. No pneumothorax. No evidence of pulmonary edema. Heart size appears borderline to mildly enlarged, likely accentuated by low lung volumes and portable AP technique. Upper mediastinal contours are within normal limits allowing for patient positioning. IMPRESSION: 1. Persistent low lung volumes with chronic elevation of the right  hemidiaphragm and bibasilar opacities (right greater than left) which may reflect areas of atelectasis and/or consolidation. Electronically Signed   By: Trudie Reed M.D.   On: 04/10/2021 11:32   DG CHEST PORT 1 VIEW  Result Date: 04/01/2021 CLINICAL DATA:  Acute respiratory failure EXAM: PORTABLE CHEST 1 VIEW COMPARISON:  Two days ago FINDINGS: Extubation of the trachea and esophagus. Very low volume chest with dense opacity on the right more than left. Cardiopericardial enlargement. No visible pneumothorax. IMPRESSION: Low volume chest with dense opacity at the bases that correlates with atelectasis on recent abdominal CT. No worsening after extubation. Electronically Signed   By: Marnee Spring M.D.   On: 04/01/2021 07:32   DG CHEST PORT 1 VIEW  Result Date: 03/30/2021 CLINICAL DATA:  Status post re-intubation EXAM: PORTABLE CHEST 1 VIEW COMPARISON:  Film from earlier in the same day. FINDINGS: Endotracheal tube is noted 2 cm above the carina. Gastric catheter extends into the stomach. Cardiac shadow is enlarged but stable. Bilateral pleural effusions are again noted. Parenchymal opacities are seen as well. IMPRESSION: Endotracheal tube in satisfactory position following re-intubation. Remainder of the study is stable. Electronically Signed   By: Alcide Clever M.D.   On: 03/30/2021 23:54   DG Chest Port 1 View  Result Date: 03/30/2021 CLINICAL DATA:  Acute respiratory failure EXAM: PORTABLE CHEST 1 VIEW COMPARISON:  03/29/2021 FINDINGS: ET tube terminates 2.5 cm above the carina. Enteric tube courses below the diaphragm with distal tip beyond the inferior margin of the film. Stable cardiomediastinal contours. Low lung volumes. Persistent bibasilar opacities  with probable small bilateral pleural effusions. No pneumothorax. IMPRESSION: Persistent bibasilar opacities with probable small bilateral pleural effusions. Electronically Signed   By: Duanne Guess D.O.   On: 03/30/2021 08:07   DG Chest  Port 1 View  Result Date: 03/29/2021 CLINICAL DATA:  Endotracheal tube positioning. EXAM: PORTABLE CHEST 1 VIEW COMPARISON:  March 29, 2021 (5:07 p.m.) FINDINGS: An endotracheal tube is seen with its distal tip approximately 2.5 cm from the carina. A nasogastric tube is noted with its distal end extending below the level of the diaphragm. Mild to moderate severity areas of atelectasis and/or infiltrate are seen within the bilateral lung bases. There is no evidence of a pleural effusion or pneumothorax. There is mild to moderate severity enlargement of the cardiac silhouette. Degenerative changes seen throughout the thoracic spine. IMPRESSION: Endotracheal tube positioning, as described above. Electronically Signed   By: Aram Candela M.D.   On: 03/29/2021 22:25   ECHOCARDIOGRAM COMPLETE  Result Date: 04/03/2021    ECHOCARDIOGRAM REPORT   Patient Name:   KINZLEIGH KANDLER Date of Exam: 04/03/2021 Medical Rec #:  [id number]      Height:       66.0 in Accession #:    [id number]     Weight:       236.1 lb Date of Birth:  Oct 16, 1945       BSA:          2.146 m Patient Age:    75 years       BP:           156/63 mmHg Patient Gender: F              HR:           70 bpm. Exam Location:  Inpatient Procedure: 2D Echo, Cardiac Doppler and Color Doppler Indications:    CHF  History:        Patient has prior history of Echocardiogram examinations, most                 recent 11/19/2020. Arrythmias:Atrial Fibrillation and Cardiac                 Arrest; Risk Factors:Diabetes and Dyslipidemia.  Sonographer:    Neomia Dear RDCS Referring Phys: [id number] MURALI RAMASWAMY  Sonographer Comments: Technically challenging study due to limited acoustic windows and patient is morbidly obese. Image acquisition challenging due to patient body habitus. IMPRESSIONS  1. Technically difficult study. Left ventricular ejection fraction, by estimation, is 60 to 65%. The left ventricle has normal function. The left ventricle has no regional wall  motion abnormalities. There is moderate asymmetric left ventricular hypertrophy of the basal-septal segment.     Left ventricular diastolic parameters are consistent with Grade II diastolic dysfunction (pseudonormalization). Elevated left atrial pressure.  2. Right ventricule is poorly visualized but grossly normal size and systolic function. There is moderately elevated pulmonary artery systolic pressure. The estimated right ventricular systolic pressure is 47.7 mmHg.  3. The mitral valve is normal in structure. No evidence of mitral valve regurgitation.  4. The aortic valve is tricuspid. Aortic valve regurgitation is not visualized. No aortic stenosis is present. FINDINGS  Left Ventricle: Left ventricular ejection fraction, by estimation, is 60 to 65%. The left ventricle has normal function. The left ventricle has no regional wall motion abnormalities. The left ventricular internal cavity size was normal in size. There is  moderate asymmetric left ventricular hypertrophy of the basal-septal segment. Left ventricular diastolic parameters are consistent with Grade  II diastolic dysfunction (pseudonormalization). Elevated left atrial pressure. Right Ventricle: The right ventricular size is normal. Right vetricular wall thickness was not well visualized. Right ventricular systolic function is normal. There is moderately elevated pulmonary artery systolic pressure. The tricuspid regurgitant velocity is 3.15 m/s, and with an assumed right atrial pressure of 8 mmHg, the estimated right ventricular systolic pressure is 47.7 mmHg. Left Atrium: Left atrial size was normal in size. Right Atrium: Right atrial size was normal in size. Pericardium: There is no evidence of pericardial effusion. Presence of pericardial fat pad. Mitral Valve: The mitral valve is normal in structure. No evidence of mitral valve regurgitation. Tricuspid Valve: The tricuspid valve is normal in structure. Tricuspid valve regurgitation is trivial.  Aortic Valve: The aortic valve is tricuspid. Aortic valve regurgitation is not visualized. No aortic stenosis is present. Aortic valve mean gradient measures 5.0 mmHg. Aortic valve peak gradient measures 10.0 mmHg. Aortic valve area, by VTI measures 1.79  cm. Pulmonic Valve: The pulmonic valve was not well visualized. Pulmonic valve regurgitation is trivial. Aorta: The aortic root and ascending aorta are structurally normal, with no evidence of dilitation. IAS/Shunts: The interatrial septum was not well visualized.  LEFT VENTRICLE PLAX 2D LVIDd:         4.50 cm     Diastology LVIDs:         3.30 cm     LV e' medial:    4.90 cm/s LV PW:         1.60 cm     LV E/e' medial:  20.8 LV IVS:        1.50 cm     LV e' lateral:   7.62 cm/s LVOT diam:     1.80 cm     LV E/e' lateral: 13.4 LV SV:         67 LV SV Index:   31 LVOT Area:     2.54 cm  LV Volumes (MOD) LV vol d, MOD A4C: 64.0 ml LV vol s, MOD A4C: 21.2 ml LV SV MOD A4C:     64.0 ml RIGHT VENTRICLE RV Basal diam:  3.80 cm RV Mid diam:    2.00 cm RV S prime:     16.00 cm/s TAPSE (M-mode): 2.6 cm LEFT ATRIUM           Index       RIGHT ATRIUM           Index LA diam:      3.80 cm 1.77 cm/m  RA Area:     14.80 cm LA Vol (A4C): 63.9 ml 29.78 ml/m RA Volume:   34.90 ml  16.26 ml/m  AORTIC VALVE                    PULMONIC VALVE AV Area (Vmax):    1.72 cm     PV Vmax:       0.98 m/s AV Area (Vmean):   1.68 cm     PV Vmean:      66.000 cm/s AV Area (VTI):     1.79 cm     PV VTI:        0.230 m AV Vmax:           158.00 cm/s  PV Peak grad:  3.9 mmHg AV Vmean:          106.000 cm/s PV Mean grad:  2.0 mmHg AV VTI:            0.376  m AV Peak Grad:      10.0 mmHg AV Mean Grad:      5.0 mmHg LVOT Vmax:         107.00 cm/s LVOT Vmean:        70.000 cm/s LVOT VTI:          0.264 m LVOT/AV VTI ratio: 0.70  AORTA Ao Root diam: 2.70 cm Ao Asc diam:  3.60 cm MITRAL VALVE                TRICUSPID VALVE MV Area (PHT): 4.57 cm     TR Peak grad:   39.7 mmHg MV Decel Time: 166  msec     TR Vmax:        315.00 cm/s MV E velocity: 102.00 cm/s MV A velocity: 123.00 cm/s  SHUNTS MV E/A ratio:  0.83         Systemic VTI:  0.26 m                             Systemic Diam: 1.80 cm Epifanio Lescheshristopher Schumann MD Electronically signed by Epifanio Lescheshristopher Schumann MD Signature Date/Time: 04/03/2021/5:53:41 PM    Final     EKG EKG Interpretation  Date/Time:  Friday April 21 2021 09:21:23 EDT Ventricular Rate:  77 PR Interval:  165 QRS Duration: 88 QT Interval:  385 QTC Calculation: 436 R Axis:   35 Text Interpretation: Sinus rhythm Confirmed by Cathren LaineSteinl, Reika Callanan ([id number]) on 04/21/2021 9:23:15 AM  Radiology DG Chest Port 1 View  Result Date: 04/21/2021 CLINICAL DATA:  Shortness of breath.  Respiratory distress. EXAM: PORTABLE CHEST 1 VIEW COMPARISON:  04/12/2021 FINDINGS: Again noted are bibasilar chest densities. Slightly increased densities at the left lung base. Heart appears to be enlarged but may be accentuated by the AP portable technique. Negative for pneumothorax. Degenerative changes at the left [facility] joint. IMPRESSION: Persistent bibasilar chest densities with slightly increased disease at the left lung base. Findings could represent atelectasis or infection. Electronically Signed   By: Richarda OverlieAdam  Henn M.D.   On: 04/21/2021 11:46    Procedures Procedures   Medications Ordered in ED Medications  albuterol (PROVENTIL) (2.5 MG/3ML) 0.083% nebulizer solution 5 mg (5 mg Nebulization Given 04/21/21 1125)    ED Course  I have reviewed the triage vital signs and the nursing notes.  Pertinent labs & imaging results that were available during my care of the patient were reviewed by me and considered in my medical decision making (see chart for details).    MDM Rules/Calculators/A&P                          Iv ns. Continuous pulse ox and cardiac monitoring. Labs.   Reviewed nursing notes and prior charts for additional history. Pt appears to have very challenging situation - appears to need  bipap therapy with minimization of sedating meds, however patient  non compliant w therapy and recurrent hospitalizations for similar symptoms in past few months.   Respiratory therapy consulted. Bipap.   Labs reviewed/interpreted by me - wbc normal. ABG with low ph, elevated co2, elev hco3 - c/w acute on chronic resp failure.   Titrate bipap, resp therapy working w pt.   CXR reviewed/interpreted by me  - no ptx.   Albuterol tx re sl wheezing.   Serial rechecks - pt easier to arouse, encouraged to make deep/breaths.  Will continue to monitor mental status and  resp status closely.   Will repeat ABG. At this point pt is awake and alert, following commands, w mental status much improved from prior (although now pt not compliant w bipap/continued tx)  CRITICAL CARE RE: acute on chronic respiratory failure, acute alteration mental status, encephalopathy due to resp failure Performed by: Suzi Roots Total critical care time: 115 minutes Critical care time was exclusive of separately billable procedures and treating other patients. Critical care was necessary to treat or prevent imminent or life-threatening deterioration. Critical care was time spent personally by me on the following activities: development of treatment plan with patient and/or surrogate as well as nursing, discussions with consultants, evaluation of patient's response to treatment, examination of patient, obtaining history from patient or surrogate, ordering and performing treatments and interventions, ordering and review of laboratory studies, ordering and review of radiographic studies, pulse oximetry and re-evaluation of patient's condition.    Final Clinical Impression(s) / ED Diagnoses Final diagnoses:  None    Rx / DC Orders ED Discharge Orders     None            Cathren Laine, MD 04/21/21 1407

## 2021-04-21 NOTE — ED Notes (Signed)
Attempted report X3 

## 2021-04-21 NOTE — Progress Notes (Signed)
Patient ripping bipap mask off. ABG obtained. Patient placed on 2L [place]. Vitals are stable.

## 2021-04-21 NOTE — Progress Notes (Addendum)
Critical result given at 1830. PCO2 78.3 reported by Lyn Aung. Zhang MD notified.

## 2021-04-21 NOTE — ED Notes (Signed)
Pt stated that she needs a walker in order to ambulate. When pt was asked to ambulated pt stated "I want to walk out the room". This tech explained to pt that ambulation would be better inside the room because of unknown O2 levels when ambulating. Pt stated "I do not like that, I do not want to do that, I want to walk out the room." This tech reiterated why pt was not able to walk out of room due to unknown O2 stats with ambulation. Pt began to yell, this tech left the room to look for a walker and was unable to find one.

## 2021-04-22 ENCOUNTER — Inpatient Hospital Stay (HOSPITAL_COMMUNITY): Payer: Medicare Other

## 2021-04-22 DIAGNOSIS — R4182 Altered mental status, unspecified: Secondary | ICD-10-CM | POA: Diagnosis not present

## 2021-04-22 DIAGNOSIS — J9621 Acute and chronic respiratory failure with hypoxia: Secondary | ICD-10-CM | POA: Diagnosis not present

## 2021-04-22 DIAGNOSIS — F319 Bipolar disorder, unspecified: Secondary | ICD-10-CM | POA: Diagnosis not present

## 2021-04-22 DIAGNOSIS — J9622 Acute and chronic respiratory failure with hypercapnia: Secondary | ICD-10-CM | POA: Diagnosis not present

## 2021-04-22 LAB — COMPREHENSIVE METABOLIC PANEL
ALT: 7 U/L (ref 0–44)
AST: 8 U/L — ABNORMAL LOW (ref 15–41)
Albumin: 2.2 g/dL — ABNORMAL LOW (ref 3.5–5.0)
Alkaline Phosphatase: 33 U/L — ABNORMAL LOW (ref 38–126)
Anion gap: 7 (ref 5–15)
BUN: 14 mg/dL (ref 8–23)
CO2: 38 mmol/L — ABNORMAL HIGH (ref 22–32)
Calcium: 8.3 mg/dL — ABNORMAL LOW (ref 8.9–10.3)
Chloride: 93 mmol/L — ABNORMAL LOW (ref 98–111)
Creatinine, Ser: 0.47 mg/dL (ref 0.44–1.00)
GFR, Estimated: >60 mL/min (ref >60)
Glucose, Bld: 132 mg/dL — ABNORMAL HIGH (ref 70–99)
Potassium: 3.8 mmol/L (ref 3.5–5.1)
Sodium: 138 mmol/L (ref 135–145)
Total Bilirubin: 0.8 mg/dL (ref 0.3–1.2)
Total Protein: 5 g/dL — ABNORMAL LOW (ref 6.5–8.1)

## 2021-04-22 LAB — CBC WITH DIFFERENTIAL/PLATELET
Abs Immature Granulocytes: 0.09 10*3/uL — ABNORMAL HIGH (ref 0.00–0.07)
Basophils Absolute: 0 10*3/uL (ref 0.0–0.1)
Basophils Relative: 0 %
Eosinophils Absolute: 0.2 10*3/uL (ref 0.0–0.5)
Eosinophils Relative: 3 %
HCT: 31.9 % — ABNORMAL LOW (ref 36.0–46.0)
Hemoglobin: 9.7 g/dL — ABNORMAL LOW (ref 12.0–15.0)
Immature Granulocytes: 1 %
Lymphocytes Relative: 29 %
Lymphs Abs: 1.9 10*3/uL (ref 0.7–4.0)
MCH: 28.1 pg (ref 26.0–34.0)
MCHC: 30.4 g/dL (ref 30.0–36.0)
MCV: 92.5 fL (ref 80.0–100.0)
Monocytes Absolute: 0.8 10*3/uL (ref 0.1–1.0)
Monocytes Relative: 12 %
Neutro Abs: 3.6 10*3/uL (ref 1.7–7.7)
Neutrophils Relative %: 55 %
Platelets: 137 10*3/uL — ABNORMAL LOW (ref 150–400)
RBC: 3.45 MIL/uL — ABNORMAL LOW (ref 3.87–5.11)
RDW: 14.4 % (ref 11.5–15.5)
WBC: 6.6 10*3/uL (ref 4.0–10.5)
nRBC: 0 % (ref 0.0–0.2)

## 2021-04-22 LAB — MAGNESIUM: Magnesium: 1.9 mg/dL (ref 1.7–2.4)

## 2021-04-22 LAB — GLUCOSE, CAPILLARY
Glucose-Capillary: 134 mg/dL — ABNORMAL HIGH (ref 70–99)
Glucose-Capillary: 127 mg/dL — ABNORMAL HIGH (ref 70–99)
Glucose-Capillary: 217 mg/dL — ABNORMAL HIGH (ref 70–99)
Glucose-Capillary: 182 mg/dL — ABNORMAL HIGH (ref 70–99)

## 2021-04-22 LAB — BLOOD GAS, VENOUS
Acid-Base Excess: 16.6 mmol/L — ABNORMAL HIGH (ref 0.0–2.0)
Bicarbonate: 43.1 mmol/L — ABNORMAL HIGH (ref 20.0–28.0)
FIO2: 21
O2 Saturation: 74.6 %
Patient temperature: 37
pCO2, Ven: 81.6 mmHg (ref 44.0–60.0)
pH, Ven: 7.342 (ref 7.250–7.430)
pO2, Ven: 41.8 mmHg (ref 32.0–45.0)

## 2021-04-22 LAB — VALPROIC ACID LEVEL: Valproic Acid Lvl: 74 ug/mL (ref 50.0–100.0)

## 2021-04-22 LAB — PHOSPHORUS: Phosphorus: 2.5 mg/dL (ref 2.5–4.6)

## 2021-04-22 LAB — AMMONIA: Ammonia: 101 umol/L — ABNORMAL HIGH (ref 9–35)

## 2021-04-22 IMAGING — DX DG CHEST 1V PORT
1 series · 1 of 1 positions shown · non-contrast
Comparison: Yesterday

CLINICAL DATA: Hypoxia

EXAM:
PORTABLE CHEST 1 VIEW

[chest ap]
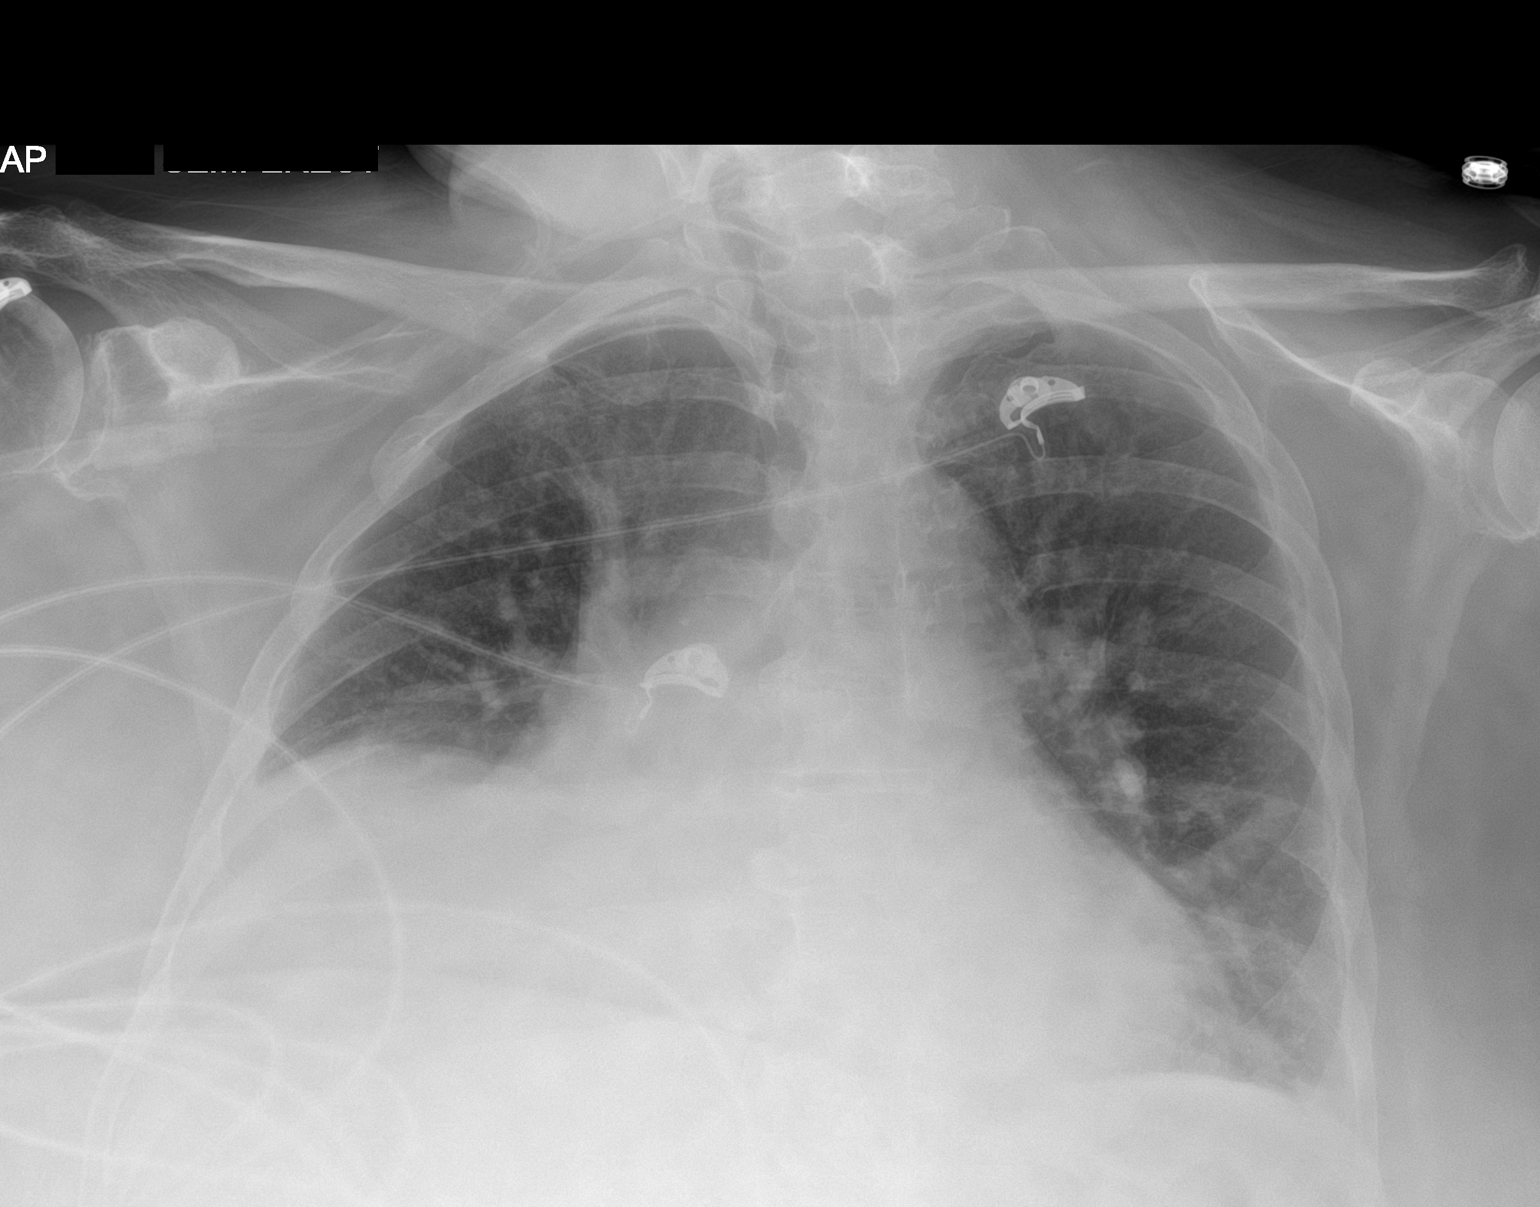

[1 of 1 positions shown; findings below may reference images not displayed]

FINDINGS: Mildly degraded exam due to AP portable technique and patient body
habitus. Patient rotated to the right. Moderate cardiomegaly,
accentuated by AP portable technique. Small right pleural effusion
persists. No pneumothorax. Pulmonary interstitial thickening.
improved left base aeration with persistent right lower lobe
consolidation.
IMPRESSION: Improved left base aeration.

Persistent right base atelectasis or infection with adjacent small
right pleural effusion.

Cardiomegaly with interstitial thickening, for which pulmonary
venous congestion is a concern.

## 2021-04-22 MED ORDER — ENSURE MAX PROTEIN PO LIQD
11.0000 [oz_av] | Freq: Two times a day (BID) | ORAL | Status: DC
  Administered 2021-04-23 – 2021-04-24 (×4): 11 [oz_av] via ORAL
  Filled 2021-04-22 (×7): qty 330

## 2021-04-22 MED ORDER — LORAZEPAM 2 MG/ML IJ SOLN
1.0000 mg | Freq: Four times a day (QID) | INTRAMUSCULAR | Status: DC | PRN
Start: 2021-04-22 — End: 2021-04-25
  Administered 2021-04-22 – 2021-04-23 (×2): 1 mg via INTRAVENOUS
  Filled 2021-04-22 (×2): qty 1

## 2021-04-22 MED ORDER — DILTIAZEM LOAD VIA INFUSION
10.0000 mg | Freq: Once | INTRAVENOUS | Status: AC
  Administered 2021-04-22: 10 mg via INTRAVENOUS
  Filled 2021-04-22: qty 10

## 2021-04-22 MED ORDER — ADULT MULTIVITAMIN W/MINERALS CH
1.0000 | ORAL_TABLET | Freq: Every day | ORAL | Status: DC
  Administered 2021-04-22 – 2021-04-24 (×3): 1 via ORAL
  Filled 2021-04-22 (×3): qty 1

## 2021-04-22 MED ORDER — FUROSEMIDE 10 MG/ML IJ SOLN
60.0000 mg | Freq: Once | INTRAMUSCULAR | Status: AC
  Administered 2021-04-22: 80 mg via INTRAVENOUS
  Filled 2021-04-22: qty 6

## 2021-04-22 MED ORDER — DILTIAZEM HCL-DEXTROSE 125-5 MG/125ML-% IV SOLN (PREMIX)
5.0000 mg/h | INTRAVENOUS | Status: DC
  Administered 2021-04-22: 5 mg/h via INTRAVENOUS
  Administered 2021-04-23 (×2): 12.5 mg/h via INTRAVENOUS
  Administered 2021-04-24: 10 mg/h via INTRAVENOUS
  Administered 2021-04-24: 12.5 mg/h via INTRAVENOUS
  Filled 2021-04-22 (×5): qty 125

## 2021-04-22 MED ORDER — IPRATROPIUM-ALBUTEROL 0.5-2.5 (3) MG/3ML IN SOLN
3.0000 mL | Freq: Four times a day (QID) | RESPIRATORY_TRACT | Status: DC | PRN
  Administered 2021-04-26 – 2021-04-27 (×2): 3 mL via RESPIRATORY_TRACT
  Filled 2021-04-22 (×2): qty 3

## 2021-04-22 MED ORDER — RISPERIDONE 1 MG PO TBDP
1.0000 mg | ORAL_TABLET | Freq: Every day | ORAL | Status: DC
  Administered 2021-04-22 – 2021-04-24 (×3): 1 mg via ORAL
  Filled 2021-04-22 (×4): qty 1

## 2021-04-22 NOTE — Progress Notes (Signed)
CRITICAL VALUE STICKER  CRITICAL VALUE: PCO2- 81.6  RECEIVER (on-site recipient of call):T.Sharlet Salina, RN  DATE & TIME NOTIFIED: 04/22/2021 @0139   MESSENGER (representative from lab):  MD NOTIFIED: Dr. Karrie Doffing  TIME OF NOTIFICATION:sent page at 0202  RESPONSE: pending

## 2021-04-22 NOTE — Consult Note (Addendum)
[facility] Face-to-Face Psychiatry Consult   Reason for Consult:  "Severe Hallucinations, Paranoia, BiPolar DIsorder and significant labile moods" Referring Physician:  Dr. Marland Mcalpine Patient Identification: Becky Hendricks MRN:  [id number] Principal Diagnosis: <principal problem not specified> Diagnosis:  Active Problems:   Acute on chronic respiratory failure with hypoxia and hypercapnia (HCC)   Acute and chronic respiratory failure with hypercapnia (HCC)   Respiratory failure (HCC)   Total Time spent with patient: 20 minutes  H&P   Becky Hendricks is a 75 y.o. female with history significant of chronic hypoxic hypercapnic respiratory failure secondary to OSA, noncompliant with BiPAP at night, morbid obesity, IDDM, HTN, PAF on Eliquis, bipolar disorder, sent from nursing home for AMS. She was recently admitted with acute on chronic respirtatory failure with elevated Co2 and AMS. She was discahreged to a SNF 9/8 and was non compliant with bipap and then represented 9/9 with AMS. Psychiatry was consulted during this admission at which ointed depakote 750 mg BID and risperdal 2 mg BID and 1 mg at 2 pm was recommended. Throughout stay patient reported difficulty with tolerating mask.  Since admission she has had difficulty tolerating bipap. Received haldol 5 mg IV on 9/9 at 1442 and today at 0248 and 0857. Upon entering room, patient is noted to be tearful. She is aware that she is in the hospital and that she came from a nursing facility. She recalls recent admission to the hospital and speaking with psychiatry at that time. She does appear to be confused at times and makes some non-sensical statements-she becomes tearful when discussing about how she will give me "marching orders" and asks me to share her lunch with her.  She perseverates on her perceived poor treatment while in the hospital and states that she fell while "in my therapy boots" and expresses that no one helped her. On chart review, there is no  documentation of her falling this admission. She denies SI/HI/AVH. She expresses that she likes the risperdal and depakote and feels that it works well. Attempted to discuss importance of wearing bipap mask as this contributes to hospitalization; patient becomes somewhat irritable and states that she will not wear the mask as it makes her feel "closed in".     Risk to Self:  no Risk to Others:  no   Past Medical History:  Past Medical History:  Diagnosis Date   Atrial fibrillation (HCC)    Bipolar disorder (HCC)    Bipolar disorder (HCC) 04/27/2020   Clostridium difficile diarrhea 08/02/2019   Dehydration    Essential hypertension    Fall 02/11/2020   Morbid obesity (HCC)    Osteomyelitis (HCC) 12/03/2019   Pneumonia due to COVID-19 virus 09/15/2019   Type 2 diabetes mellitus (HCC)    Weakness     Past Surgical History:  Procedure Laterality Date   INCISION AND DRAINAGE PERIRECTAL ABSCESS Left 12/04/2019   Procedure: IRRIGATION AND DEBRIDEMENT LEFT BUTTOCK ABSCESS;  Surgeon: Violeta Gelinas, MD;  Location: [facility];  Service: General;  Laterality: Left;   INCISION AND DRAINAGE PERIRECTAL ABSCESS Left 12/10/2019   Procedure: REPEAT IRRIGATION AND DEBRIDEMENT BUTTOCK  ABSCESS;  Surgeon: Abigail Miyamoto, MD;  Location: [facility] OR;  Service: General;  Laterality: Left;   IR FLUORO GUIDE CV LINE RIGHT  11/25/2020   IR RADIOLOGIST EVAL & MGMT  12/29/2020   IR US GUIDE VASC ACCESS RIGHT  11/25/2020   Family History:  Family History  Family history unknown: Yes   Family Psychiatric  History: unknown Social History:  Social History   Substance and Sexual Activity  Alcohol Use Not Currently     Social History   Substance and Sexual Activity  Drug Use Not Currently    Social History   Socioeconomic History   Marital status: Single    Spouse name: Not on file   Number of children: Not on file   Years of education: Not on file   Highest education level: Not on file  Occupational History    Not on file  Tobacco Use   Smoking status: Never   Smokeless tobacco: Never  Vaping Use   Vaping Use: Unknown  Substance and Sexual Activity   Alcohol use: Not Currently   Drug use: Not Currently   Sexual activity: Not Currently    Birth control/protection: Post-menopausal  Other Topics Concern   Not on file  Social History Narrative   Not on file   Social Determinants of Health   Financial Resource Strain: Not on file  Food Insecurity: Not on file  Transportation Needs: Not on file  Physical Activity: Not on file  Stress: Not on file  Social Connections: Not on file   Additional Social History:    Allergies:   Allergies  Allergen Reactions   Chlorhexidine Gluconate Itching   Vancomycin     Vancomycin infusion related reaction- May need Vanc to be given at a slower rate    Acetazolamide Er Rash    Labs:  Results for orders placed or performed during the hospital encounter of 04/21/21 (from the past 48 hour(s))  Brain natriuretic peptide     Status: Abnormal   Collection Time: 04/21/21  9:44 AM  Result Value Ref Range   B Natriuretic Peptide 154.5 (H) 0.0 - 100.0 pg/mL    Comment: Performed at [facility], [address], [place], [place] [address]  CBC     Status: Abnormal   Collection Time: 04/21/21  9:44 AM  Result Value Ref Range   WBC 6.6 4.0 - 10.5 K/uL   RBC 3.51 (L) 3.87 - 5.11 MIL/uL   Hemoglobin 9.9 (L) 12.0 - 15.0 g/dL   HCT 53.2 (L) 99.2 - 42.6 %   MCV 97.2 80.0 - 100.0 fL   MCH 28.2 26.0 - 34.0 pg   MCHC 29.0 (L) 30.0 - 36.0 g/dL   RDW 83.4 19.6 - 22.2 %   Platelets 157 150 - 400 K/uL   nRBC 0.0 0.0 - 0.2 %    Comment: Performed at [facility], [address], [place], [place] [address]  I-Stat arterial blood gas, ED     Status: Abnormal   Collection Time: 04/21/21  9:56 AM  Result Value Ref Range   pH, Arterial 7.293 (L) 7.350 - 7.450   pCO2 arterial 100.3 (HH) 32.0 - 48.0 mmHg   pO2, Arterial 71 (L) 83.0 - 108.0 mmHg    Bicarbonate 48.4 (H) 20.0 - 28.0 mmol/L   TCO2 >50 (H) 22 - 32 mmol/L   O2 Saturation 90.0 %   Acid-Base Excess 18.0 (H) 0.0 - 2.0 mmol/L   Sodium 140 135 - 145 mmol/L   Potassium 4.4 3.5 - 5.1 mmol/L   Calcium, Ion 1.22 1.15 - 1.40 mmol/L   HCT 30.0 (L) 36.0 - 46.0 %   Hemoglobin 10.2 (L) 12.0 - 15.0 g/dL   Patient temperature 21.1 F    Collection site Radial    Drawn by RT    Sample type ARTERIAL    Comment NOTIFIED PHYSICIAN  Resp Panel by RT-PCR (Flu A&B, Covid) Nasopharyngeal Swab     Status: None   Collection Time: 04/21/21 10:18 AM   Specimen: Nasopharyngeal Swab; Nasopharyngeal(NP) swabs in vial transport medium  Result Value Ref Range   SARS Coronavirus 2 by RT PCR NEGATIVE NEGATIVE    Comment: (NOTE) SARS-CoV-2 target nucleic acids are NOT DETECTED.  The SARS-CoV-2 RNA is generally detectable in upper respiratory specimens during the acute phase of infection. The lowest concentration of SARS-CoV-2 viral copies this assay can detect is 138 copies/mL. A negative result does not preclude SARS-Cov-2 infection and should not be used as the sole basis for treatment or other patient management decisions. A negative result may occur with  improper specimen collection/handling, submission of specimen other than nasopharyngeal swab, presence of viral mutation(s) within the areas targeted by this assay, and inadequate number of viral copies(<138 copies/mL). A negative result must be combined with clinical observations, patient history, and epidemiological information. The expected result is Negative.  Fact Sheet for Patients:  BloggerCourse.com  Fact Sheet for Healthcare Providers:  SeriousBroker.it  This test is no t yet approved or cleared by the [place] FDA and  has been authorized for detection and/or diagnosis of SARS-CoV-2 by FDA under an Emergency Use Authorization (EUA). This EUA will remain  in effect (meaning  this test can be used) for the duration of the COVID-19 declaration under Section 564(b)(1) of the Act, 21 U.S.C.section 360bbb-3(b)(1), unless the authorization is terminated  or revoked sooner.       Influenza A by PCR NEGATIVE NEGATIVE   Influenza B by PCR NEGATIVE NEGATIVE    Comment: (NOTE) The Xpert Xpress SARS-CoV-2/FLU/RSV plus assay is intended as an aid in the diagnosis of influenza from Nasopharyngeal swab specimens and should not be used as a sole basis for treatment. Nasal washings and aspirates are unacceptable for Xpert Xpress SARS-CoV-2/FLU/RSV testing.  Fact Sheet for Patients: BloggerCourse.com  Fact Sheet for Healthcare Providers: SeriousBroker.it  This test is not yet approved or cleared by the [place] FDA and has been authorized for detection and/or diagnosis of SARS-CoV-2 by FDA under an Emergency Use Authorization (EUA). This EUA will remain in effect (meaning this test can be used) for the duration of the COVID-19 declaration under Section 564(b)(1) of the Act, 21 U.S.C. section 360bbb-3(b)(1), unless the authorization is terminated or revoked.  Performed at [facility], [address], [place], [place] [address]   I-Stat arterial blood gas, ED     Status: Abnormal   Collection Time: 04/21/21  2:20 PM  Result Value Ref Range   pH, Arterial 7.329 (L) 7.350 - 7.450   pCO2 arterial 95.2 (HH) 32.0 - 48.0 mmHg   pO2, Arterial 107 83.0 - 108.0 mmHg   Bicarbonate 49.9 (H) 20.0 - 28.0 mmol/L   TCO2 >50 (H) 22 - 32 mmol/L   O2 Saturation 97.0 %   Acid-Base Excess 20.0 (H) 0.0 - 2.0 mmol/L   Sodium 141 135 - 145 mmol/L   Potassium 4.1 3.5 - 5.1 mmol/L   Calcium, Ion 1.23 1.15 - 1.40 mmol/L   HCT 30.0 (L) 36.0 - 46.0 %   Hemoglobin 10.2 (L) 12.0 - 15.0 g/dL   Patient temperature 60.4 F    Collection site Radial    Drawn by RT    Sample type ARTERIAL    Comment NOTIFIED PHYSICIAN   Glucose,  capillary     Status: Abnormal   Collection Time: 04/21/21  5:29 PM  Result Value Ref Range   Glucose-Capillary 115 (H) 70 - 99 mg/dL    Comment: Glucose reference range applies only to samples taken after fasting for at least 8 hours.  HIV Antibody (routine testing w rflx)     Status: None   Collection Time: 04/21/21  6:22 PM  Result Value Ref Range   HIV Screen 4th Generation wRfx Non Reactive Non Reactive    Comment: Performed at [facility], [address] [place], [place] [address]  Blood gas, venous     Status: Abnormal   Collection Time: 04/21/21  6:22 PM  Result Value Ref Range   FIO2 28.00    pH, Ven 7.360 7.250 - 7.430   pCO2, Ven 78.3 (HH) 44.0 - 60.0 mmHg    Comment: CRITICAL RESULT CALLED TO, READ BACK BY AND VERIFIED WITH: Barnett AbuM. LEGGETT RN [phone] 04/21/21 LIN AUNG    pO2, Ven 32.7 32.0 - 45.0 mmHg   Bicarbonate 43.1 (H) 20.0 - 28.0 mmol/L   Acid-Base Excess 16.8 (H) 0.0 - 2.0 mmol/L   O2 Saturation 61.7 %   Patient temperature 37.0    Drawn by 164    Sample type VENOUS     Comment: Performed at [facility], [address] [place], [place] [address]  Glucose, capillary     Status: Abnormal   Collection Time: 04/21/21  8:38 PM  Result Value Ref Range   Glucose-Capillary 179 (H) 70 - 99 mg/dL    Comment: Glucose reference range applies only to samples taken after fasting for at least 8 hours.  Blood gas, venous     Status: Abnormal   Collection Time: 04/22/21  1:20 AM  Result Value Ref Range   FIO2 21.00    pH, Ven 7.342 7.250 - 7.430   pCO2, Ven 81.6 (HH) 44.0 - 60.0 mmHg    Comment: CRITICAL RESULT CALLED TO, READ BACK BY AND VERIFIED WITH: NOTIFIED B.TRAVEA RN @0139  04/22/21 JOHNSON. A    pO2, Ven 41.8 32.0 - 45.0 mmHg   Bicarbonate 43.1 (H) 20.0 - 28.0 mmol/L   Acid-Base Excess 16.6 (H) 0.0 - 2.0 mmol/L   O2 Saturation 74.6 %   Patient temperature 37.0     Comment: Performed at [facility], [address] [place], [place] [address]   Glucose, capillary     Status: Abnormal   Collection Time: 04/22/21  8:04 AM  Result Value Ref Range   Glucose-Capillary 134 (H) 70 - 99 mg/dL    Comment: Glucose reference range applies only to samples taken after fasting for at least 8 hours.  CBC with Differential/Platelet     Status: Abnormal   Collection Time: 04/22/21  9:12 AM  Result Value Ref Range   WBC 6.6 4.0 - 10.5 K/uL   RBC 3.45 (L) 3.87 - 5.11 MIL/uL   Hemoglobin 9.7 (L) 12.0 - 15.0 g/dL   HCT 86.531.9 (L) 78.436.0 - 69.646.0 %   MCV 92.5 80.0 - 100.0 fL   MCH 28.1 26.0 - 34.0 pg   MCHC 30.4 30.0 - 36.0 g/dL   RDW 29.514.4 28.411.5 - 13.215.5 %   Platelets 137 (L) 150 - 400 K/uL    Comment: REPEATED TO VERIFY   nRBC 0.0 0.0 - 0.2 %   Neutrophils Relative % 55 %   Neutro Abs 3.6 1.7 - 7.7 K/uL   Lymphocytes Relative 29 %   Lymphs Abs 1.9 0.7 - 4.0 K/uL   Monocytes Relative 12 %  Monocytes Absolute 0.8 0.1 - 1.0 K/uL   Eosinophils Relative 3 %   Eosinophils Absolute 0.2 0.0 - 0.5 K/uL   Basophils Relative 0 %   Basophils Absolute 0.0 0.0 - 0.1 K/uL   Immature Granulocytes 1 %   Abs Immature Granulocytes 0.09 (H) 0.00 - 0.07 K/uL    Comment: Performed at [facility], [address] [place], [place] [address]  Comprehensive metabolic panel     Status: Abnormal   Collection Time: 04/22/21  9:12 AM  Result Value Ref Range   Sodium 138 135 - 145 mmol/L   Potassium 3.8 3.5 - 5.1 mmol/L   Chloride 93 (L) 98 - 111 mmol/L   CO2 38 (H) 22 - 32 mmol/L   Glucose, Bld 132 (H) 70 - 99 mg/dL    Comment: Glucose reference range applies only to samples taken after fasting for at least 8 hours.   BUN 14 8 - 23 mg/dL   Creatinine, Ser 8.52 0.44 - 1.00 mg/dL   Calcium 8.3 (L) 8.9 - 10.3 mg/dL   Total Protein 5.0 (L) 6.5 - 8.1 g/dL   Albumin 2.2 (L) 3.5 - 5.0 g/dL   AST 8 (L) 15 - 41 U/L   ALT 7 0 - 44 U/L   Alkaline Phosphatase 33 (L) 38 - 126 U/L   Total Bilirubin 0.8 0.3 - 1.2 mg/dL   GFR, Estimated >77 >82 mL/min    Comment:  (NOTE) Calculated using the CKD-EPI Creatinine Equation (2021)    Anion gap 7 5 - 15    Comment: Performed at [facility], [address], [place], [place] [address]  Magnesium     Status: None   Collection Time: 04/22/21  9:12 AM  Result Value Ref Range   Magnesium 1.9 1.7 - 2.4 mg/dL    Comment: Performed at [facility], [address] [place], [place] [address]  Phosphorus     Status: None   Collection Time: 04/22/21  9:12 AM  Result Value Ref Range   Phosphorus 2.5 2.5 - 4.6 mg/dL    Comment: Performed at [facility], [address] [place], [place] [address]  Glucose, capillary     Status: Abnormal   Collection Time: 04/22/21 11:14 AM  Result Value Ref Range   Glucose-Capillary 127 (H) 70 - 99 mg/dL    Comment: Glucose reference range applies only to samples taken after fasting for at least 8 hours.    Current Facility-Administered Medications  Medication Dose Route Frequency Provider Last Rate Last Admin   apixaban (ELIQUIS) tablet 5 mg  5 mg Oral BID Mikey College T, MD   5 mg at 04/22/21 0858   atorvastatin (LIPITOR) tablet 10 mg  10 mg Oral QHS Mikey College T, MD       diphenhydrAMINE-zinc acetate (BENADRYL) 2-0.1 % cream   Topical TID PRN Mikey College T, MD       divalproex (DEPAKOTE) DR tablet 750 mg  750 mg Oral BID Mikey College T, MD   750 mg at 04/22/21 0859   empagliflozin (JARDIANCE) tablet 10 mg  10 mg Oral Q breakfast Mikey College T, MD   10 mg at 04/22/21 1104   ferrous sulfate tablet 324 mg  324 mg Oral Daily Mikey College T, MD   324 mg at 04/22/21 0857   haloperidol lactate (HALDOL) injection 5 mg  5 mg Intravenous Q6H PRN Mikey College T, MD   5 mg at 04/22/21  0857   insulin aspart (novoLOG) injection 0-15 Units  0-15 Units Subcutaneous TID WC Mikey College T, MD   1 Units at 04/22/21 1115   ipratropium-albuterol (DUONEB) 0.5-2.5 (3) MG/3ML nebulizer solution 3 mL  3 mL Nebulization Q6H PRN Sheikh, Omair Latif, DO       linagliptin (TRADJENTA) tablet  5 mg  5 mg Oral Daily Mikey College T, MD   5 mg at 04/22/21 0857   multivitamin with minerals tablet 1 tablet  1 tablet Oral Daily Marguerita Merles [place], DO   1 tablet at 04/22/21 1530   pantoprazole (PROTONIX) EC tablet 40 mg  40 mg Oral Daily Mikey College T, MD   40 mg at 04/22/21 0858   polyethylene glycol (MIRALAX / GLYCOLAX) packet 17 g  17 g Oral Daily Mikey College T, MD       protein supplement (ENSURE MAX) liquid  11 oz Oral BID Sheikh, Omair Latif, DO       risperiDONE (RISPERDAL M-TABS) disintegrating tablet 2 mg  2 mg Oral BID Mikey College T, MD   2 mg at 04/22/21 0900   senna-docusate (Senokot-S) tablet 2 tablet  2 tablet Oral Daily Emeline General, MD   2 tablet at 04/22/21 5784   spironolactone (ALDACTONE) tablet 25 mg  25 mg Oral Venda Rodes, MD   25 mg at 04/22/21 0859   tamsulosin (FLOMAX) capsule 0.4 mg  0.4 mg Oral Venda Rodes, MD   0.4 mg at 04/22/21 6962    Musculoskeletal: Strength & Muscle Tone:  did not assess, patient laying in bed with O2 in place Gait & Station:  id not assess, patient laying in bed with 02 in place Patient leans:  did not assess; patient laying in bed with O2 in place            Psychiatric Specialty Exam:  Presentation  General Appearance: Appropriate for Environment; Casual; Disheveled  Eye Contact:Fair  Speech:Clear and Coherent; Slow  Speech Volume:Normal  Handedness: No data recorded  Mood and Affect  Mood:Dysphoric  Affect:Depressed (tearful)   Thought Process  Thought Processes:Coherent  Descriptions of Associations:Intact  Orientation:Partial  Thought Content:WDL  History of Schizophrenia/Schizoaffective disorder:No data recorded Duration of Psychotic Symptoms:No data recorded Hallucinations:Hallucinations: None Ideas of Reference:None  Suicidal Thoughts:Suicidal Thoughts: No Homicidal Thoughts:Homicidal Thoughts: No  Sensorium  Memory:Immediate Fair; Recent  Fair  Judgment:Impaired  Insight:Lacking   Executive Functions  Concentration:Fair  Attention Span:Fair  Recall:Fair  Fund of Knowledge:Fair  Language:Fair   Psychomotor Activity  Psychomotor Activity: Psychomotor Activity: Normal  Assets  Assets:Communication Skills; Resilience   Sleep  Sleep: Sleep: Fair  Physical Exam: Physical Exam HENT:     Head: Normocephalic and atraumatic.  Neurological:     Mental Status: She is alert.   Review of Systems  Psychiatric/Behavioral:  Negative for depression, hallucinations, substance abuse and suicidal ideas.   Blood pressure (!) 201/81, pulse 67, temperature 97.8 F (36.6 C), temperature source Axillary, resp. rate 20, SpO2 100 %. There is no height or weight on file to calculate BMI.  Treatment Plan Summary: 75 year old female with a history of hronic hypoxic hypercapnic respiratory failure secondary to OSA, noncompliant with BiPAP at night, morbid obesity, IDDM, HTN, PAF on Eliquis, bipolar disorder, sent from nursing home for AMS. Patient was recently admitted with a similar presentation. She was restarted on depakote 750 BID and risperdal 2 mg BID. Recommend to resume medications she was previously stabilized on (see below) and to order  VPA level and ammonia level. She denies any delusions, suicidal thoughts, or homicidal thoughts. She is tearful at times and makes occasional non sensical statements-see above, but does not meet criteria for IVC. Patient does have a dx of bipolar disorder but also has chronic respiratory failure which can be contributing to delirium resulting in Agitation, hallucinations and paranoia.   Recommendations: -Continue Depakote 750mg  BID for aggression/agitation -recommend to obtain depakote level; last level obtained was on 04/14/21 41. Depakote level to be ordered on 09*9*22 from previous hospitalization however she was discharged prior to this. Please order for it to be drawn prior to depakote  dose to reflect a true trough level -recommend ammonia level -Continue Risperdal 2mg  po BID , and add 1mg  at 2 pm (previously stabilized at this dose during last hospitalization    Communicated recommendations to primary team via secure chat Psychiatry to continue to follow  Disposition: No evidence of imminent risk to self or others at present.    06/14/21, MD 04/22/2021 3:43 PM

## 2021-04-22 NOTE — Evaluation (Signed)
Occupational Therapy Evaluation Patient Details Name: Becky Hendricks MRN: [id number] DOB: 1946/02/12 Today's Date: 04/22/2021    History of Present Illness 75 y.o. female with medical history significant of chronic hypoxic hypercapnic respiratory failure secondary to OSA, noncompliant with BiPAP at night, morbid obesity, IDDM, HTN, PAF on Eliquis, bipolar disorder, sent from nursing home for AMS   Clinical Impression   Patient admitted form SNF for diagnosis above.  Patient admits to bed level ADL from staff, and hoyer lift to wheelchair.  OT recommends out of bed with staff and hoyer lift as tolerated.  Recommend return to SNF when cleared medically.  No skilled OT needs identified.      Follow Up Recommendations  SNF    Equipment Recommendations  None recommended by OT    Recommendations for Other Services       Precautions / Restrictions Precautions Precautions: Fall Precaution Comments: incontinence Restrictions Weight Bearing Restrictions: No      Mobility Bed Mobility Overal bed mobility: Needs Assistance Bed Mobility: Rolling Rolling: +2 for safety/equipment;+2 for physical assistance;Total assist           Patient Response: Anxious  Transfers Overall transfer level: Needs assistance               General transfer comment: needs  mechanical lift  if OOB.    Balance                                           ADL either performed or assessed with clinical judgement   ADL Overall ADL's : At baseline                                             Vision Patient Visual Report: No change from baseline       Perception     Praxis      Pertinent Vitals/Pain Pain Assessment: No/denies pain     Hand Dominance Right   Extremity/Trunk Assessment Upper Extremity Assessment Upper Extremity Assessment: RUE deficits/detail;LUE deficits/detail;Generalized weakness RUE Deficits / Details: fingers digging into palm.  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 and stiff to all pivots R hemi body RUE Sensation: WNL RUE Coordination: decreased fine motor;decreased gross motor LUE Deficits / Details: limited shoulder flexion   Lower Extremity Assessment Lower Extremity Assessment: Defer to PT evaluation   Cervical / Trunk Assessment Cervical / Trunk Assessment: Kyphotic   Communication Communication Communication: No difficulties   Cognition Arousal/Alertness: Awake/alert Behavior During Therapy: Anxious Overall Cognitive Status: History of cognitive impairments - at baseline                                     General Comments       Exercises     Shoulder Instructions      Home Living Family/patient expects to be discharged to:: Skilled nursing facility   Available Help at Discharge: Skilled Nursing Facility Type of Home: Skilled Nursing Facility                           Additional Comments: patient has been in long term SNF, Patient requires total care and OOB  via mechanical  lift.  Prior Functioning/Environment                   OT Problem List: Decreased cognition      OT Treatment/Interventions:      OT Goals(Current goals can be found in the care plan section) Acute Rehab OT Goals Patient Stated Goal: none stated OT Goal Formulation: Patient unable to participate in goal setting Time For Goal Achievement: 04/24/21 Potential to Achieve Goals: Fair  OT Frequency:     Barriers to D/C:            Co-evaluation              AM-PAC OT "6 Clicks" Daily Activity     Outcome Measure Help from another person eating meals?: A Lot Help from another person taking care of personal grooming?: A Lot Help from another person toileting, which includes using toliet, bedpan, or urinal?: Total Help from another person bathing (including washing, rinsing, drying)?: Total Help from another person to put on and taking off regular upper body clothing?: A  Lot Help from another person to put on and taking off regular lower body clothing?: Total 6 Click Score: 9   End of Session Nurse Communication: Mobility status  Activity Tolerance: Treatment limited secondary to medical complications (Comment) Patient left: in bed;with call bell/phone within reach;with bed alarm set  OT Visit Diagnosis: Other symptoms and signs involving cognitive function                Time: 2595-6387 OT Time Calculation (min): 19 min Charges:  OT General Charges $OT Visit: 1 Visit OT Evaluation $OT Eval Moderate Complexity: 1 Mod  04/22/2021  RP, OTR/L  Acute Rehabilitation Services  Office:  [phone]   Suzanna Obey 04/22/2021, 8:33 AM

## 2021-04-22 NOTE — Progress Notes (Signed)
BiPAP was applied to patient after critical result delivered to MD at 0200. Thoughout the remainder of the shift, patient wrestled with BiPAP mask trying to remove it. She was successful in doing so several times. Patient ripped the head gear piece so the CPAP was able to be removed easier. Attempted to correct this action from the patient several times until 0630,nasal canula was applied.

## 2021-04-22 NOTE — Progress Notes (Signed)
Initial Nutrition Assessment  DOCUMENTATION CODES:   Obesity unspecified  INTERVENTION:   Ensure Max po BID, each supplement provides 150 kcal and 30 grams of protein.  MVI with minerals daily.  NUTRITION DIAGNOSIS:   Increased nutrient needs related to wound healing as evidenced by estimated needs.  GOAL:   Patient will meet greater than or equal to 90% of their needs  MONITOR:   PO intake, Supplement acceptance, Skin  REASON FOR ASSESSMENT:   Consult COPD Protocol  ASSESSMENT:   75 yo female admitted from SNF with AMS. PMH includes chronic respiratory failure d/t OSA, noncompliant with BiPAP at night, IDDM, HTN, PAF, bipolar disorder.  Patient recently hospitalized for respiratory failure requiring intubation. She was discharged to SNF on 9/7.  Unable to speak with patient at this time or complete nutrition focused physical exam.  On previous admission, patient was eating well after extubation. Currently on a heart healthy carbohydrate modified diet. Meal intakes not recorded.   Patient removed BiPAP mask multiple times overnight. Changed to 2 L via [place] this morning.   New unstageable pressure injury to sacrum noted this admission.   Labs reviewed.  CBG: 134-127  Medications reviewed and include ferrous sulfate, Novolog, Senokot-S, spironolactone, Flomax.  Current weight 105.9 kg, from last admission.   NUTRITION - FOCUSED PHYSICAL EXAM:  Unable to complete  Diet Order:   Diet Order             Diet heart healthy/carb modified Room service appropriate? Yes; Fluid consistency: Thin  Diet effective now                   EDUCATION NEEDS:   Not appropriate for education at this time  Skin:  Skin Assessment: Skin Integrity Issues: Skin Integrity Issues:: Unstageable Unstageable: sacrum  Last BM:  9/9 type 6  Height:   Ht Readings from Last 1 Encounters:  04/14/21 5\' 6"  (1.676 m)    Weight:   Wt Readings from Last 1 Encounters:  04/19/21  105.9 kg    BMI: 37.7 using most recent ht/wt  Estimated Nutritional Needs:   Kcal:  1800-2000  Protein:  110-130 gm  Fluid:  >/= 1.8 L    06/19/21, RD, LDN, CNSC Please refer to Amion for contact information.

## 2021-04-22 NOTE — Evaluation (Signed)
Physical Therapy Evaluation Patient Details Name: Becky Hendricks MRN: [id number] DOB: Oct 04, 1945 Today's Date: 04/22/2021   History of Present Illness  75 y.o. female with medical history significant of chronic hypoxic hypercapnic respiratory failure secondary to OSA, noncompliant with BiPAP at night, morbid obesity, IDDM, HTN, PAF on Eliquis, bipolar disorder, sent from nursing home for AMS  Clinical Impression  Unfortunately, this pt appears to be at her limited baseline functioning and therapy has nothing to offer her at this time.  She is best managed by nursing at bed level with mechanical lift to the w/c or recliner for time spent up OOB.  Will sign off at this time.     Follow Up Recommendations No PT follow up    Equipment Recommendations  None recommended by PT    Recommendations for Other Services       Precautions / Restrictions Precautions Precautions: Fall Precaution Comments: incontinence      Mobility  Bed Mobility Overal bed mobility: Needs Assistance Bed Mobility: Rolling Rolling: +2 for safety/equipment;+2 for physical assistance;Total assist         General bed mobility comments: several trials of rolling each direction during extensive pericare session.  Pt needed multimodal cues for any initiation and assist to facilitate normalized rolling.  Pt unable to hold sidelying today.    Transfers Overall transfer level: Needs assistance               General transfer comment: needs  mechanical lift  if OOB.  transition to EOB deferred due to patient was anxious and not wanting to do more after the peri care session.  Ambulation/Gait             General Gait Details: NA  Stairs            Wheelchair Mobility    Modified Rankin (Stroke Patients Only)       Balance Overall balance assessment:  (NT today)                                           Pertinent Vitals/Pain      Home Living Family/patient expects to  be discharged to:: Skilled nursing facility   Available Help at Discharge: Skilled Nursing Facility Type of Home: Skilled Nursing Facility           Additional Comments: patient has been in long term SNF, Patient requires total care and OOB  via mechanical  lift.    Prior Function                 Hand Dominance   Dominant Hand: Right    Extremity/Trunk Assessment   Upper Extremity Assessment RUE Deficits / Details: fingers digging into palm. 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 and stiff to all pivots R hemi body RUE Sensation: WNL RUE Coordination: decreased fine motor;decreased gross motor LUE Deficits / Details: limited shoulder flexion    Lower Extremity Assessment Lower Extremity Assessment: RLE deficits/detail;LLE deficits/detail RLE Deficits / Details: held stiffly in extention toes/foot plantar flexed, pt grimaces and calls out with flexion of knee, no discernable flexion, 1-2/5 gross extension RLE Coordination: decreased gross motor LLE Deficits / Details: held stiffly in extention toes/foot plantar flexed, pt grimaces and calls out with flexion of knee, no discernable flexion, 1-2/5 gross extension LLE Coordination: decreased gross motor    Cervical / Trunk Assessment Cervical / Trunk Assessment: Kyphotic  Communication   Communication: No difficulties  Cognition Arousal/Alertness: Awake/alert Behavior During Therapy: Anxious Overall Cognitive Status: History of cognitive impairments - at baseline                                        General Comments General comments (skin integrity, edema, etc.): vss overall.    Exercises Other Exercises Other Exercises: within session completed gentle PROM to all 4 extremities   Assessment/Plan    PT Assessment Patent does not need any further PT services  PT Problem List         PT Treatment Interventions      PT Goals (Current goals can be found in the Care Plan section)  Acute Rehab PT  Goals Patient Stated Goal: none stated PT Goal Formulation: All assessment and education complete, DC therapy Potential to Achieve Goals: Poor    Frequency     Barriers to discharge        Co-evaluation               AM-PAC PT "6 Clicks" Mobility  Outcome Measure Help needed turning from your back to your side while in a flat bed without using bedrails?: Total Help needed moving from lying on your back to sitting on the side of a flat bed without using bedrails?: Total Help needed moving to and from a bed to a chair (including a wheelchair)?: Total Help needed standing up from a chair using your arms (e.g., wheelchair or bedside chair)?: Total Help needed to walk in hospital room?: Total Help needed climbing 3-5 steps with a railing? : Total 6 Click Score: 6    End of Session Equipment Utilized During Treatment: Oxygen Activity Tolerance: Patient tolerated treatment well;Other (comment) (limited by anxiety) Patient left: in bed;with call bell/phone within reach;with bed alarm set Nurse Communication: Need for lift equipment;Mobility status PT Visit Diagnosis: Muscle weakness (generalized) (M62.81);Adult, failure to thrive (R62.7);Other abnormalities of gait and mobility (R26.89)    Time: 4540-9811 PT Time Calculation (min) (ACUTE ONLY): 22 min   Charges:   PT Evaluation $PT Eval Moderate Complexity: 1 Mod          04/22/2021  Jacinto Halim., PT Acute Rehabilitation Services [phone]  (pager) [phone]  (office)  Eliseo Gum Julieth Tugman 04/22/2021, 3:51 PM

## 2021-04-22 NOTE — Progress Notes (Signed)
PROGRESS NOTE    Becky Hendricks  XBM:[id number] DOB: 08/25/1945 DOA: 04/21/2021 PCP: Roderic ScarceHartman, Lisa, MD  Brief Narrative:  The patient is a 75 year old obese [place] female with a past medical history significant for blood #2 chronic hypoxic hypercapnic respiratory failure secondary to OSA with noncompliant with BiPAP, obesity, insulin-dependent diabetes mellitus type 2, hypertension, proximal atrial fibrillation on Eliquis, bipolar disorder as well as other comorbidities who was sent from her facility for AMS.  She has been recently hospitalized 2 times in 1 month for similar condition of hypoxic hypercapnic respiratory failure all because she will refuse to wear her BiPAP as well as including 1 recent hospital stay where she was intubated and extubated and discharged 2 days ago.  The morning of admission she was found obtunded in her bed and was not wearing her BiPAP.  She presented ED and showed decompensated acute on chronic HFrEF Hypoxic respiratory failure with a pH of 7.29 and a PCO2 of 100.  After 2 hours of BiPAP she improved to 10.32 with a PCO2 of 95 and was more awake.  Chest x-ray showed similar bilateral lower lung field changes and she became agitated and is now hallucinating significantly confused so psychiatry has been consulted.  Assessment & Plan:   Active Problems:   Acute on chronic respiratory failure with hypoxia and hypercapnia (HCC)   Acute and chronic respiratory failure with hypercapnia (HCC)   Respiratory failure (HCC)  Acute Metabolic Encephalopathy in the setting of acute on chronic hypercapnic hypoxic respiratory failure respiratory acidosis along in the setting of OSA with noncompliance of BiPAP as well as acute on chronic diastolic CHF -She has a significant history of acute on chronic hypercapnic hypoxic respiratory failure and respiratory acidosis -She is familiar to: And recently was discharged 3 days ago.  Her brother lives in [place] and she has a very poor  prognosis but he wants referral: None she was admitted to the PCU -She continues to be significantly agitated and hallucinating and very upset -She has a short-term and long-term poor prognosis given her repeated failure noncompliance with BiPAP and is at risk for sudden death -ABG    Component Value Date/Time   PHART 7.329 (L) 04/21/2021 1420   PCO2ART 95.2 (HH) 04/21/2021 1420   PO2ART 107 04/21/2021 1420   HCO3 43.1 (H) 04/22/2021 0120   TCO2 >50 (H) 04/21/2021 1420   O2SAT 74.6 04/22/2021 0120    -Continue supplemental oxygen via nasal cannula and BiPAP as tolerated and wean oxygen to baseline -Continuous pulse oximetry maintain O2 saturation greater 90% -SpO2: 100 % O2 Flow Rate (L/min): 2 L/min FiO2 (%): 40 % -Likely her encephalopathy is in the setting of her hypercapnic issues and her chest x-ray showed chronic changes and she has received antibiotics on both admissions recently but will not initiate this time -Patient is allergic to acetazolamide; continue with spironolactone 12 5 mg p.o. every other day and will give a dose of IV Lasix 60 mg given that she appeared volume overloaded -C/w Duoneb q6hprn Wheezing and SOB -BNP was 154.5 -Continue to monitor respiratory status carefully and continue with BiPAP nightly -Chest x-ray in the a.m. and she will need an ambulatory home O2 screen prior to discharge  Bipolar disorder, hallucinations and significant agitation and paranoia as well as psychosis -Psychiatry has been consulted for further evaluation recommendations but will need to rule out infection first and checking a urinalysis and urine culture as well as blood cultures x2 given her history of bacteremia -Continue  with divalproex 750 mg p.o. twice daily as well as continuing risperidone 2 mg p.o. twice daily -Has Haldol lactate 5 mg IV every 6 as needed agitation -Further care per psychiatry  Acute on Chronic hypercapnic/hypoxic respiratory failure with respiratory  acidosis Acute on chronic diastolic CHF -See as above -She has grade 2 diastolic dysfunction and elevated left atrial pressure -Strict I's and O's and daily weights -We will give a dose of IV Lasix now and repeat in the morning -Continue with BiPAP as needed and nightly  PAF -C/w Telemetry Monitoring  -C/w Apixaban 5 mg po BID  -Beta-blocker was held in setting of bradycardia -Continue Telemetry Monitoring   Normocytic Anemia -Patient's Hgb/Hct went from 9.3/31.9 -> 10.2/30.0 -> 9.7/31.9 -C/w Ferrous Sulfate 324 mg po Daily  -Check Anemia Panel in the AM -Continue to Monitor for S/Sx of Bleeding  -Repeat CBC in the AM   Thrombocytopenia -Patient's Platelet Count has gone from 157 -> 137 -Continue to Monitor and Trend Repeat CMP int eh AM   Insulin-dependent diabetes Mellitus Type 2 -Continue with moderate NovoLog/scale insulin as well as linagliptin 5 mg p.o. daily as well as empagliflozin 10 mg p.o. daily -Continue monitor CBGs  GERD/GI prophylaxis -Continue with pantoprazole 40 once daily  Hyperlipidemia -C/w Atorvastatin 10 mg po qHS  Hypertension -Blood pressures been elevated -Given a dose of IV Lasix -Her beta-blocker is being held currently -Continue monitor blood pressures per protocol -We will add IV hydralazine -Last BP reading was 201/81 early this AM   Obesity -Complicates overall prognosis and care -Estimated body mass index is 37.68 kg/m as calculated from the following:   Height as of 04/14/21:  (1.676 m).   Weight as of 04/19/21: 105.9 kg. -Weight Loss and Dietary Counseling  -Nutritionist consulted and recommending Ensure Max po BID and MVI+ Minerals Daily    DVT prophylaxis: Anticoagulated with Apixaban Code Status: FULL CODE Family Communication: No family present at bedside  Disposition Plan: Pending further clinical improvement   Status is: Inpatient  Remains inpatient appropriate because:Unsafe d/c plan, IV treatments appropriate due  to intensity of illness or inability to take PO, and Inpatient level of care appropriate due to severity of illness  Dispo: The patient is from: SNF              Anticipated d/c is to: SNF              Patient currently is not medically stable to d/c.   Difficult to place patient No  Consultants:  Psychiatry   Procedures: None  Antimicrobials:  Anti-infectives (From admission, onward)    None        Subjective: Seen and examined at bedside and she was hallucinating and very upset and tearful.  She is psychotic this morning and had very pressured speech and hysterical.  Denied any complaints and did not want me to leave the room.  No other concerns or plans at this time.  Objective: Vitals:   04/22/21 0000 04/22/21 0213 04/22/21 0236 04/22/21 0820  BP: (!) 124/37  (!) 201/81   Pulse: (!) 50 67    Resp: (!) 25 20    Temp:   97.8 F (36.6 C)   TempSrc:   Axillary   SpO2: 98% 97%  100%    Intake/Output Summary (Last 24 hours) at 04/22/2021 1453 Last data filed at 04/21/2021 1724 Gross per 24 hour  Intake 120 ml  Output --  Net 120 ml   There were  no vitals filed for this visit.  Examination: Physical Exam:  Constitutional: WN/WD obese female who is actively hallucinating and hysterical and very paranoid Eyes: Lids and conjunctivae normal, sclerae anicteric  ENMT: External Ears, Nose appear normal.  Neck: Appears normal, supple, no cervical masses, normal ROM, no appreciable thyromegaly; mild JVD Respiratory: Diminished to auscultation bilaterally with coarse breath sounds and some rhonchi but no wheezing, rales. Normal respiratory effort and patient is not tachypenic. No accessory muscle use.  Wearing supplemental oxygen via nasal cannula Cardiovascular: RRR, no murmurs / rubs / gallops. S1 and S2 auscultated. 1-2+ LE edema  Abdomen: Soft, non-tender, Distended 2/2 body habitus. Bowel sounds positive.  GU: Deferred. Musculoskeletal: No clubbing / cyanosis of  digits/nails. No joint deformity upper and lower extremities.  Skin: No rashes, lesions, ulcers on a limited skin evaluation. No induration; Warm and dry.  Neurologic: CN 2-12 grossly intact with no focal deficits. Romberg sign and cerebellar reflexes not assessed.  Psychiatric: Impaired judgment and insight. Awake and Psychotic and Paranoid   Data Reviewed: I have personally reviewed following labs and imaging studies  CBC: Recent Labs  Lab 04/16/21 0225 04/21/21 0944 04/21/21 0956 04/21/21 1420 04/22/21 0912  WBC 9.1 6.6  --   --  6.6  NEUTROABS  --   --   --   --  3.6  HGB 9.3* 9.9* 10.2* 10.2* 9.7*  HCT 31.9* 34.1* 30.0* 30.0* 31.9*  MCV 95.5 97.2  --   --  92.5  PLT 133* 157  --   --  137*   Basic Metabolic Panel: Recent Labs  Lab 04/16/21 0225 04/21/21 0956 04/21/21 1420 04/22/21 0912  NA 138 140 141 138  K 4.8 4.4 4.1 3.8  CL 96*  --   --  93*  CO2 38*  --   --  38*  GLUCOSE 116*  --   --  132*  BUN 11  --   --  14  CREATININE 0.64  --   --  0.47  CALCIUM 8.1*  --   --  8.3*  MG  --   --   --  1.9  PHOS  --   --   --  2.5   GFR: Estimated Creatinine Clearance: 77 mL/min (by C-G formula based on SCr of 0.47 mg/dL). Liver Function Tests: Recent Labs  Lab 04/22/21 0912  AST 8*  ALT 7  ALKPHOS 33*  BILITOT 0.8  PROT 5.0*  ALBUMIN 2.2*   No results for input(s): LIPASE, AMYLASE in the last 168 hours. No results for input(s): AMMONIA in the last 168 hours. Coagulation Profile: No results for input(s): INR, PROTIME in the last 168 hours. Cardiac Enzymes: No results for input(s): CKTOTAL, CKMB, CKMBINDEX, TROPONINI in the last 168 hours. BNP (last 3 results) No results for input(s): PROBNP in the last 8760 hours. HbA1C: No results for input(s): HGBA1C in the last 72 hours. CBG: Recent Labs  Lab 04/19/21 2039 04/21/21 1729 04/21/21 2038 04/22/21 0804 04/22/21 1114  GLUCAP 171* 115* 179* 134* 127*   Lipid Profile: No results for input(s): CHOL,  HDL, LDLCALC, TRIG, CHOLHDL, LDLDIRECT in the last 72 hours. Thyroid Function Tests: No results for input(s): TSH, T4TOTAL, FREET4, T3FREE, THYROIDAB in the last 72 hours. Anemia Panel: No results for input(s): VITAMINB12, FOLATE, FERRITIN, TIBC, IRON, RETICCTPCT in the last 72 hours. Sepsis Labs: No results for input(s): PROCALCITON, LATICACIDVEN in the last 168 hours.  Recent Results (from the past 240 hour(s))  Resp Panel  by RT-PCR (Flu A&B, Covid) Nasopharyngeal Swab     Status: None   Collection Time: 04/18/21 12:18 PM   Specimen: Nasopharyngeal Swab; Nasopharyngeal(NP) swabs in vial transport medium  Result Value Ref Range Status   SARS Coronavirus 2 by RT PCR NEGATIVE NEGATIVE Final    Comment: (NOTE) SARS-CoV-2 target nucleic acids are NOT DETECTED.  The SARS-CoV-2 RNA is generally detectable in upper respiratory specimens during the acute phase of infection. The lowest concentration of SARS-CoV-2 viral copies this assay can detect is 138 copies/mL. A negative result does not preclude SARS-Cov-2 infection and should not be used as the sole basis for treatment or other patient management decisions. A negative result may occur with  improper specimen collection/handling, submission of specimen other than nasopharyngeal swab, presence of viral mutation(s) within the areas targeted by this assay, and inadequate number of viral copies(<138 copies/mL). A negative result must be combined with clinical observations, patient history, and epidemiological information. The expected result is Negative.  Fact Sheet for Patients:  BloggerCourse.com  Fact Sheet for Healthcare Providers:  SeriousBroker.it  This test is no t yet approved or cleared by the [place] FDA and  has been authorized for detection and/or diagnosis of SARS-CoV-2 by FDA under an Emergency Use Authorization (EUA). This EUA will remain  in effect (meaning this  test can be used) for the duration of the COVID-19 declaration under Section 564(b)(1) of the Act, 21 U.S.C.section 360bbb-3(b)(1), unless the authorization is terminated  or revoked sooner.       Influenza A by PCR NEGATIVE NEGATIVE Final   Influenza B by PCR NEGATIVE NEGATIVE Final    Comment: (NOTE) The Xpert Xpress SARS-CoV-2/FLU/RSV plus assay is intended as an aid in the diagnosis of influenza from Nasopharyngeal swab specimens and should not be used as a sole basis for treatment. Nasal washings and aspirates are unacceptable for Xpert Xpress SARS-CoV-2/FLU/RSV testing.  Fact Sheet for Patients: BloggerCourse.com  Fact Sheet for Healthcare Providers: SeriousBroker.it  This test is not yet approved or cleared by the [place] FDA and has been authorized for detection and/or diagnosis of SARS-CoV-2 by FDA under an Emergency Use Authorization (EUA). This EUA will remain in effect (meaning this test can be used) for the duration of the COVID-19 declaration under Section 564(b)(1) of the Act, 21 U.S.C. section 360bbb-3(b)(1), unless the authorization is terminated or revoked.  Performed at [facility], [address] [place], [place] [address]   Resp Panel by RT-PCR (Flu A&B, Covid) Nasopharyngeal Swab     Status: None   Collection Time: 04/21/21 10:18 AM   Specimen: Nasopharyngeal Swab; Nasopharyngeal(NP) swabs in vial transport medium  Result Value Ref Range Status   SARS Coronavirus 2 by RT PCR NEGATIVE NEGATIVE Final    Comment: (NOTE) SARS-CoV-2 target nucleic acids are NOT DETECTED.  The SARS-CoV-2 RNA is generally detectable in upper respiratory specimens during the acute phase of infection. The lowest concentration of SARS-CoV-2 viral copies this assay can detect is 138 copies/mL. A negative result does not preclude SARS-Cov-2 infection and should not be used as the sole basis for treatment or other  patient management decisions. A negative result may occur with  improper specimen collection/handling, submission of specimen other than nasopharyngeal swab, presence of viral mutation(s) within the areas targeted by this assay, and inadequate number of viral copies(<138 copies/mL). A negative result must be combined with clinical observations, patient history, and epidemiological information. The expected result is Negative.  Fact Sheet for Patients:  BloggerCourse.com  Fact Sheet for Healthcare Providers:  SeriousBroker.it  This test is no t yet approved or cleared by the [place] FDA and  has been authorized for detection and/or diagnosis of SARS-CoV-2 by FDA under an Emergency Use Authorization (EUA). This EUA will remain  in effect (meaning this test can be used) for the duration of the COVID-19 declaration under Section 564(b)(1) of the Act, 21 U.S.C.section 360bbb-3(b)(1), unless the authorization is terminated  or revoked sooner.       Influenza A by PCR NEGATIVE NEGATIVE Final   Influenza B by PCR NEGATIVE NEGATIVE Final    Comment: (NOTE) The Xpert Xpress SARS-CoV-2/FLU/RSV plus assay is intended as an aid in the diagnosis of influenza from Nasopharyngeal swab specimens and should not be used as a sole basis for treatment. Nasal washings and aspirates are unacceptable for Xpert Xpress SARS-CoV-2/FLU/RSV testing.  Fact Sheet for Patients: BloggerCourse.com  Fact Sheet for Healthcare Providers: SeriousBroker.it  This test is not yet approved or cleared by the [place] FDA and has been authorized for detection and/or diagnosis of SARS-CoV-2 by FDA under an Emergency Use Authorization (EUA). This EUA will remain in effect (meaning this test can be used) for the duration of the COVID-19 declaration under Section 564(b)(1) of the Act, 21 U.S.C. section  360bbb-3(b)(1), unless the authorization is terminated or revoked.  Performed at [facility], [address], [place], [place] [address]     RN Pressure Injury Documentation: Pressure Injury 04/21/21 Sacrum Medial Unstageable - Full thickness tissue loss in which the base of the injury is covered by slough (yellow, tan, gray, green or brown) and/or eschar (tan, brown or black) in the wound bed. yellow, pink, craterous (Active)  04/21/21 1720  Location: Sacrum  Location Orientation: Medial  Staging: Unstageable - Full thickness tissue loss in which the base of the injury is covered by slough (yellow, tan, gray, green or brown) and/or eschar (tan, brown or black) in the wound bed.  Wound Description (Comments): yellow, pink, craterous  Present on Admission: Yes     Estimated body mass index is 37.68 kg/m as calculated from the following:   Height as of 04/14/21: 5\' 6"  (1.676 m).   Weight as of 04/19/21: 105.9 kg.  Malnutrition Type:  Nutrition Problem: Increased nutrient needs Etiology: wound healing  Malnutrition Characteristics:  Signs/Symptoms: estimated needs  Nutrition Interventions:  Interventions: MVI, Ensure Enlive (each supplement provides 350kcal and 20 grams of protein)   Radiology Studies: DG Chest Port 1 View  Result Date: 04/22/2021 CLINICAL DATA:  Hypoxia EXAM: PORTABLE CHEST 1 VIEW COMPARISON:  Yesterday FINDINGS: Mildly degraded exam due to AP portable technique and patient body habitus. Patient rotated to the right. Moderate cardiomegaly, accentuated by AP portable technique. Small right pleural effusion persists. No pneumothorax. Pulmonary interstitial thickening. improved left base aeration with persistent right lower lobe consolidation. IMPRESSION: Improved left base aeration. Persistent right base atelectasis or infection with adjacent small right pleural effusion. Cardiomegaly with interstitial thickening, for which pulmonary venous congestion is a  concern. Electronically Signed   By: 06/22/2021 M.D.   On: 04/22/2021 10:46   DG Chest Port 1 View  Result Date: 04/21/2021 CLINICAL DATA:  Shortness of breath.  Respiratory distress. EXAM: PORTABLE CHEST 1 VIEW COMPARISON:  04/12/2021 FINDINGS: Again noted are bibasilar chest densities. Slightly increased densities at the left lung base. Heart appears to be enlarged but may be accentuated by the AP portable technique. Negative for pneumothorax. Degenerative changes at  the left AC joint. IMPRESSION: Persistent bibasilar chest densities with slightly increased disease at the left lung base. Findings could represent atelectasis or infection. Electronically Signed   By: Richarda Overlie M.D.   On: 04/21/2021 11:46    Scheduled Meds:  apixaban  5 mg Oral BID   atorvastatin  10 mg Oral QHS   divalproex  750 mg Oral BID   empagliflozin  10 mg Oral Q breakfast   ferrous sulfate  324 mg Oral Daily   insulin aspart  0-15 Units Subcutaneous TID WC   linagliptin  5 mg Oral Daily   multivitamin with minerals  1 tablet Oral Daily   pantoprazole  40 mg Oral Daily   polyethylene glycol  17 g Oral Daily   Ensure Max Protein  11 oz Oral BID   risperiDONE  2 mg Oral BID   senna-docusate  2 tablet Oral Daily   spironolactone  25 mg Oral QODAY   tamsulosin  0.4 mg Oral QODAY   Continuous Infusions:   LOS: 1 day   Merlene Laughter, DO [facility] PAGER is on AMION  If 7PM-7AM, please contact night-coverage [email or web address]

## 2021-04-23 ENCOUNTER — Inpatient Hospital Stay (HOSPITAL_COMMUNITY): Payer: Medicare Other

## 2021-04-23 DIAGNOSIS — E7220 Disorder of urea cycle metabolism, unspecified: Secondary | ICD-10-CM | POA: Diagnosis not present

## 2021-04-23 DIAGNOSIS — J9621 Acute and chronic respiratory failure with hypoxia: Secondary | ICD-10-CM | POA: Diagnosis not present

## 2021-04-23 DIAGNOSIS — J9622 Acute and chronic respiratory failure with hypercapnia: Secondary | ICD-10-CM | POA: Diagnosis not present

## 2021-04-23 LAB — GLUCOSE, CAPILLARY
Glucose-Capillary: 132 mg/dL — ABNORMAL HIGH (ref 70–99)
Glucose-Capillary: 149 mg/dL — ABNORMAL HIGH (ref 70–99)
Glucose-Capillary: 150 mg/dL — ABNORMAL HIGH (ref 70–99)
Glucose-Capillary: 163 mg/dL — ABNORMAL HIGH (ref 70–99)
Glucose-Capillary: 174 mg/dL — ABNORMAL HIGH (ref 70–99)

## 2021-04-23 LAB — CBC WITH DIFFERENTIAL/PLATELET
Abs Immature Granulocytes: 0.08 10*3/uL — ABNORMAL HIGH (ref 0.00–0.07)
Basophils Absolute: 0 10*3/uL (ref 0.0–0.1)
Basophils Relative: 0 %
Eosinophils Absolute: 0.3 10*3/uL (ref 0.0–0.5)
Eosinophils Relative: 4 %
HCT: 33.6 % — ABNORMAL LOW (ref 36.0–46.0)
Hemoglobin: 10.5 g/dL — ABNORMAL LOW (ref 12.0–15.0)
Immature Granulocytes: 1 %
Lymphocytes Relative: 32 %
Lymphs Abs: 2.3 10*3/uL (ref 0.7–4.0)
MCH: 28.6 pg (ref 26.0–34.0)
MCHC: 31.3 g/dL (ref 30.0–36.0)
MCV: 91.6 fL (ref 80.0–100.0)
Monocytes Absolute: 0.9 10*3/uL (ref 0.1–1.0)
Monocytes Relative: 13 %
Neutro Abs: 3.5 10*3/uL (ref 1.7–7.7)
Neutrophils Relative %: 50 %
Platelets: 148 10*3/uL — ABNORMAL LOW (ref 150–400)
RBC: 3.67 MIL/uL — ABNORMAL LOW (ref 3.87–5.11)
RDW: 14.6 % (ref 11.5–15.5)
WBC: 7.1 10*3/uL (ref 4.0–10.5)
nRBC: 0 % (ref 0.0–0.2)

## 2021-04-23 LAB — MAGNESIUM: Magnesium: 1.9 mg/dL (ref 1.7–2.4)

## 2021-04-23 LAB — COMPREHENSIVE METABOLIC PANEL
ALT: 7 U/L (ref 0–44)
AST: 8 U/L — ABNORMAL LOW (ref 15–41)
Albumin: 2.3 g/dL — ABNORMAL LOW (ref 3.5–5.0)
Alkaline Phosphatase: 33 U/L — ABNORMAL LOW (ref 38–126)
Anion gap: 11 (ref 5–15)
BUN: 16 mg/dL (ref 8–23)
CO2: 36 mmol/L — ABNORMAL HIGH (ref 22–32)
Calcium: 8.6 mg/dL — ABNORMAL LOW (ref 8.9–10.3)
Chloride: 92 mmol/L — ABNORMAL LOW (ref 98–111)
Creatinine, Ser: 0.74 mg/dL (ref 0.44–1.00)
GFR, Estimated: >60 mL/min (ref >60)
Glucose, Bld: 164 mg/dL — ABNORMAL HIGH (ref 70–99)
Potassium: 3.6 mmol/L (ref 3.5–5.1)
Sodium: 139 mmol/L (ref 135–145)
Total Bilirubin: 0.3 mg/dL (ref 0.3–1.2)
Total Protein: 5.3 g/dL — ABNORMAL LOW (ref 6.5–8.1)

## 2021-04-23 LAB — PHOSPHORUS: Phosphorus: 3.5 mg/dL (ref 2.5–4.6)

## 2021-04-23 LAB — AMMONIA: Ammonia: 23 umol/L (ref 9–35)

## 2021-04-23 IMAGING — DX DG CHEST 1V PORT
1 series · 1 of 1 positions shown · non-contrast
Comparison: [DATE]

CLINICAL DATA: Evaluate atelectasis and right pleural effusion

EXAM:
PORTABLE CHEST 1 VIEW

[chest ap]
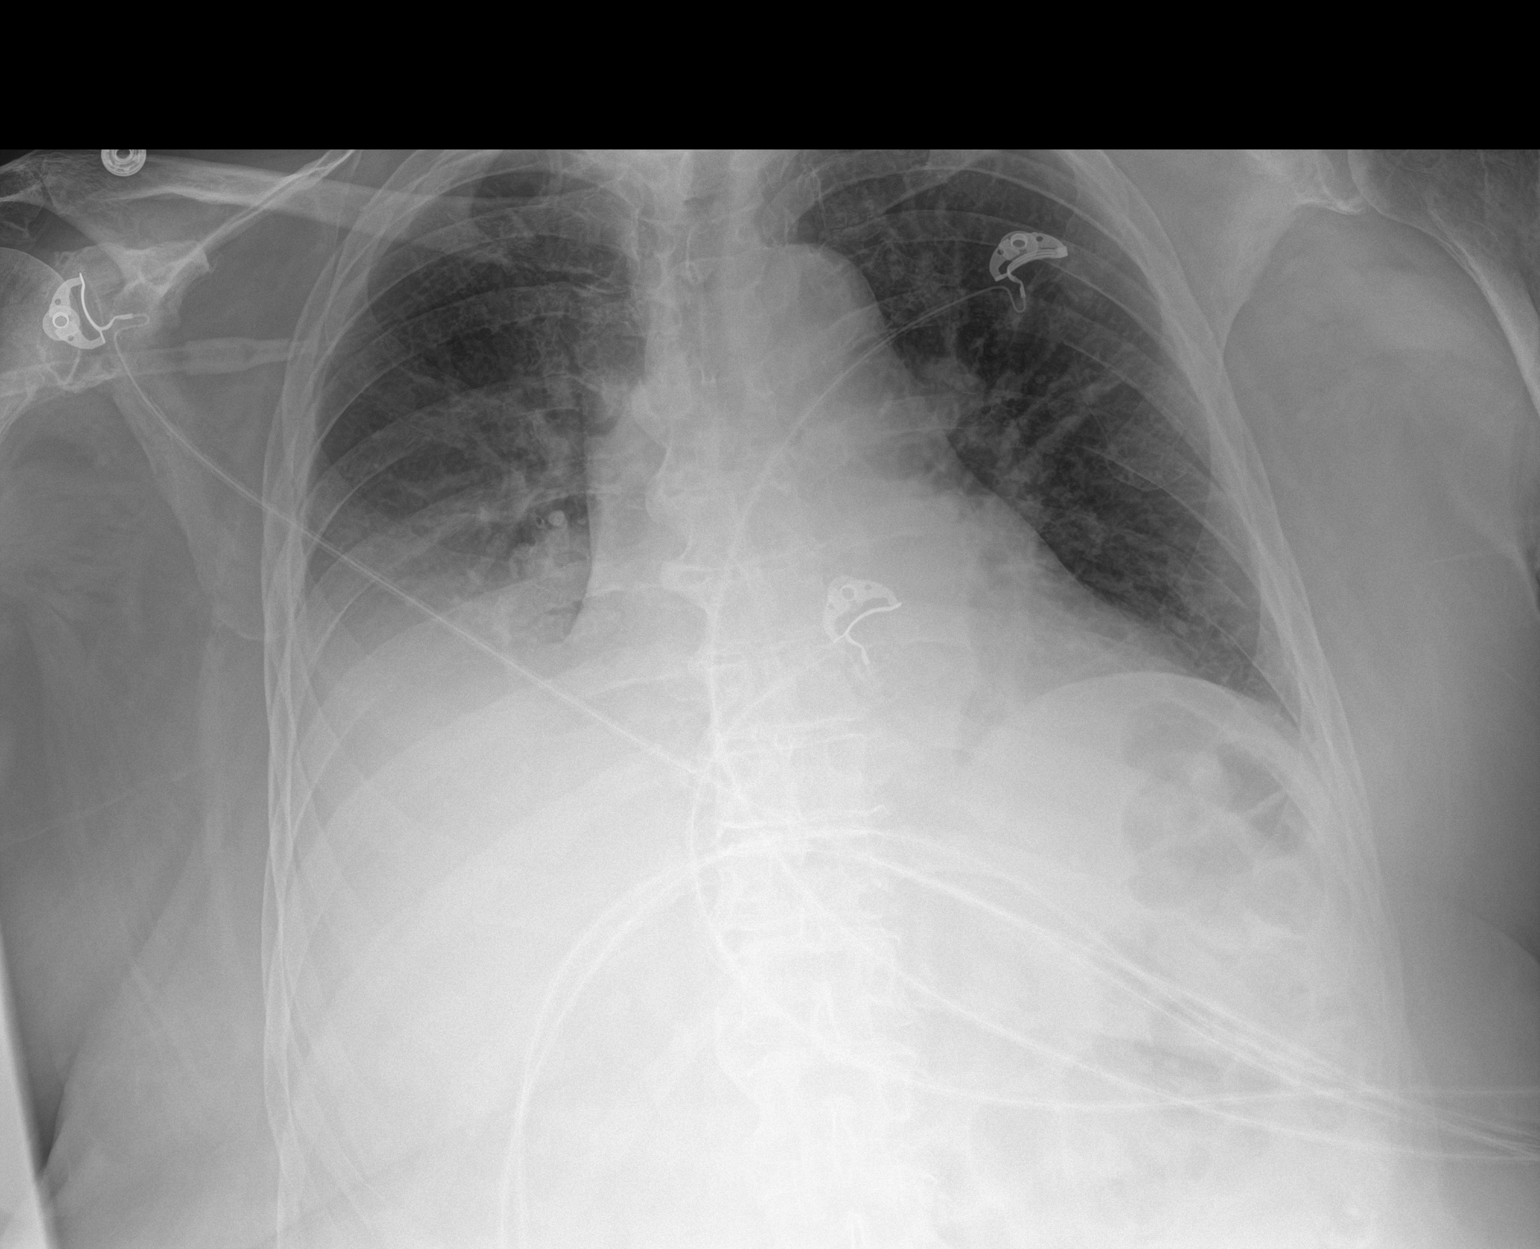

[1 of 1 positions shown; findings below may reference images not displayed]

FINDINGS: Midline trachea. Cardiomegaly accentuated by AP portable technique.
Increase in small to moderate right pleural effusion. No
pneumothorax. Pulmonary interstitial prominence persists. Persistent
right base airspace disease. Improved left-sided aeration.
IMPRESSION: Slight increase in right-sided pleural effusion with increased
adjacent right base airspace disease.

Further improvement in left base aeration.

Cardiomegaly with improved mild pulmonary venous congestion.

## 2021-04-23 MED ORDER — LACTULOSE 10 GM/15ML PO SOLN
10.0000 g | Freq: Two times a day (BID) | ORAL | Status: DC
  Administered 2021-04-24 (×2): 10 g via ORAL
  Filled 2021-04-23 (×2): qty 15

## 2021-04-23 MED ORDER — FUROSEMIDE 10 MG/ML IJ SOLN
40.0000 mg | Freq: Once | INTRAMUSCULAR | Status: AC
  Administered 2021-04-23: 40 mg via INTRAVENOUS
  Filled 2021-04-23: qty 4

## 2021-04-23 MED ORDER — LACTULOSE 10 GM/15ML PO SOLN
20.0000 g | Freq: Two times a day (BID) | ORAL | Status: DC
  Administered 2021-04-23: 20 g via ORAL

## 2021-04-23 NOTE — TOC Initial Note (Signed)
Transition of Care [facility]) - Initial/Assessment Note    Patient Details  Name: Becky Hendricks MRN: [id number] Date of Birth: 20-Jun-1946  [facility]) CM/SW Contact:    Jimmy Picket, LCSW Phone Number: 04/23/2021, 11:21 AM  Clinical Narrative:                  CSW spoke to pts brother [place] via phone. Pt is oriented x2. [name] stated pt has been at [facility] since Feb. 2021. Pt plans on returning at [place]. CSW was unable to reach [facility] to confirm.   Expected Discharge Plan: Long Term Acute Care (LTAC) Barriers to Discharge: Continued Medical Work up   Patient Goals and CMS Choice        Expected Discharge Plan and Services Expected Discharge Plan: Long Term Acute Care (LTAC)                                              Prior Living Arrangements/Services     Patient language and need for interpreter reviewed:: Yes          Care giver support system in place?: Yes (comment)   Criminal Activity/Legal Involvement Pertinent to Current Situation/Hospitalization: No - Comment as needed  Activities of Daily Living      Permission Sought/Granted   Permission granted to share information with : Yes, Verbal Permission Granted  Share Information with NAME: SND     Permission granted to share info w Relationship: Brother [name]     Emotional Assessment Appearance:: Appears stated age Attitude/Demeanor/Rapport: Unable to Assess Affect (typically observed): Unable to Assess Orientation: : Oriented to Self, Oriented to Place Alcohol / Substance Use: Not Applicable Psych Involvement: No (comment)  Admission diagnosis:  Respiratory failure (HCC) [J96.90] Encephalopathy acute [G93.40] Hypoxia [R09.02] Non compliance with medical treatment [Z91.19] Acute and chronic respiratory failure with hypercapnia (HCC) [J96.22] OSA treated with BiPAP [G47.33] Acute alteration in mental status [R41.82] Patient Active Problem List   Diagnosis Date Noted    Respiratory failure (HCC) 04/21/2021   OSA treated with BiPAP    Acute and chronic respiratory failure with hypercapnia (HCC) 04/10/2021   Acute on chronic respiratory failure with hypoxia and hypercapnia (HCC)    Acute respiratory failure with hypoxia (HCC) 03/29/2021   Hyponatremia 03/29/2021   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   Pneumonia of lower lobe due to infectious organism 03/29/2021   Colitis 03/29/2021   Acute respiratory failure (HCC) 03/29/2021   Bipolar affective disorder, currently manic, mild (HCC) 11/19/2020   Constipation 11/18/2020   Encephalopathy 11/18/2020   MRSA bacteremia    SIRS (systemic inflammatory response syndrome) (HCC) 11/17/2020   Nausea and vomiting 11/17/2020   Mixed diabetic hyperlipidemia associated with type 2 diabetes mellitus (HCC) 11/17/2020   Type 2 diabetes mellitus with hyperglycemia, with long-term current use of insulin (HCC) 11/17/2020   Immunization due 07/26/2020   Psychosis (HCC)    Bipolar disorder (HCC) 04/27/2020   Fall 02/11/2020   Chronic diastolic CHF (congestive heart failure) (HCC) 01/22/2020   Drug rash    Acute osteomyelitis of pelvic region and thigh (HCC)    Palliative care by specialist    Goals of care, counseling/discussion    Edema of upper extremity    Hyperphosphatemia    Cardiac arrest (HCC)    Sacral wound    Hypoxia    Severe sepsis (  HCC) 12/03/2019   Abscess of multiple sites of buttock 12/03/2019   AF (paroxysmal atrial fibrillation) (HCC) 12/03/2019   Metabolic encephalopathy 09/15/2019   Urinary tract infection without hematuria 09/15/2019   Pressure injury of skin 09/10/2019   Morbid obesity with BMI of 40.0-44.9, adult (HCC) 02/27/2018   PCP:  Roderic Scarce, MD Pharmacy:   [facility], [place] - [address] [place] [address] Phone: [phone] Fax: [phone]  [facility] - [place], [place] - [address] [place] [address] Phone: [phone] Fax: [phone]     Social Determinants of Health (SDOH) Interventions    Readmission Risk Interventions No flowsheet data found.  Jimmy Picket, LCSW Clinical Social Worker

## 2021-04-23 NOTE — Progress Notes (Signed)
   04/22/21 2100  Assess: MEWS Score  Temp 98.5 F (36.9 C)  BP (!) 141/66  Pulse Rate (!) 139  ECG Heart Rate (!) 133  Resp (!) 27  Level of Consciousness Alert  SpO2 97 %  O2 Device Nasal Cannula  Assess: MEWS Score  MEWS Temp 0  MEWS Systolic 0  MEWS Pulse 3  MEWS RR 2  MEWS LOC 0  MEWS Score 5  MEWS Score Color Red  Assess: if the MEWS score is Yellow or Red  Were vital signs taken at a resting state? Yes  Focused Assessment Change from prior assessment (see assessment flowsheet)  Does the patient meet 2 or more of the SIRS criteria? No  Does the patient have a confirmed or suspected source of infection? No  Early Detection of Sepsis Score *See Row Information* Medium  MEWS guidelines implemented *See Row Information* Yes  Treat  MEWS Interventions Administered prn meds/treatments;Administered scheduled meds/treatments  Pain Scale 0-10  Pain Score 0  Take Vital Signs  Increase Vital Sign Frequency  Red: Q 1hr X 4 then Q 4hr X 4, if remains red, continue Q 4hrs  Escalate  MEWS: Escalate Red: discuss with charge nurse/RN and provider, consider discussing with RRT  Notify: Charge Nurse/RN  Name of Charge Nurse/RN Notified Marchelle Folks  Date Charge Nurse/RN Notified 04/22/21  Time Charge Nurse/RN Notified 2110  Notify: Provider  Provider Name/Title Chotiner, B  Date Provider Notified 04/22/21  Time Provider Notified 2110  Notification Type Page  Notification Reason Change in status  Provider response See new orders;At bedside (came by afterward)  Date of Provider Response 04/22/21  Time of Provider Response 2115  Document  Progress note created (see row info) Yes  Patient converted to Afib with RVR in 160's. Patient is anxious but no chest pain or respiratory distress.  MD Chotiner secure chat, EKG obtained and cardizem gtts started, red MEWs protocol follow, will titrate cardizem per goal and protcol

## 2021-04-23 NOTE — Progress Notes (Signed)
Patient ID: Becky Hendricks, female   DOB: 1945-11-07, 74 y.o.   MRN: [id number]  Progress note Chart reviewed and patient seen this morning. Ammonia was 101 last night and patient was started on lactulose; ammonia came down to 23 today. VPA level 74. LFTs from today- AST 8, ALT 7 Patient was drowsy this AM and unable to complete assessment. Continue medications as previously recommended by psychiatry at this time. If patient remains drowsy and unable to participate; may need to consider decreasing depakote or stopping altogether. Psychiatry to follow up tomorrow

## 2021-04-23 NOTE — NC FL2 (Addendum)
[place] MEDICAID FL2 LEVEL OF CARE SCREENING TOOL     IDENTIFICATION  Patient Name: Becky Hendricks Birthdate: Sep 20, 1945 Sex: female Admission Date (Current Location): 04/21/2021  [facility] and [place] Number:  Producer, television/film/video and Address:  [facility], [address] [place], [place] [address]      Provider Number: [id number]  Attending Physician Name and Address:  Merlene Laughter, DO  Relative Name and Phone Number:  [name] [place](brother) [phone]    Current Level of Care: Hospital Recommended Level of Care: Nursing Facility Prior Approval Number:    Date Approved/Denied:   PASRR Number:  [id number]  Discharge Plan: Other (Comment) (LTC)    Current Diagnoses: Patient Active Problem List   Diagnosis Date Noted   Respiratory failure (HCC) 04/21/2021   OSA treated with BiPAP    Acute and chronic respiratory failure with hypercapnia (HCC) 04/10/2021   Acute on chronic respiratory failure with hypoxia and hypercapnia (HCC)    Acute respiratory failure with hypoxia (HCC) 03/29/2021   Hyponatremia 03/29/2021   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   Pneumonia of lower lobe due to infectious organism 03/29/2021   Colitis 03/29/2021   Acute respiratory failure (HCC) 03/29/2021   Bipolar affective disorder, currently manic, mild (HCC) 11/19/2020   Constipation 11/18/2020   Encephalopathy 11/18/2020   MRSA bacteremia    SIRS (systemic inflammatory response syndrome) (HCC) 11/17/2020   Nausea and vomiting 11/17/2020   Mixed diabetic hyperlipidemia associated with type 2 diabetes mellitus (HCC) 11/17/2020   Type 2 diabetes mellitus with hyperglycemia, with long-term current use of insulin (HCC) 11/17/2020   Immunization due 07/26/2020   Psychosis (HCC)    Bipolar disorder (HCC) 04/27/2020   Fall 02/11/2020   Chronic diastolic CHF (congestive heart failure) (HCC) 01/22/2020   Drug rash    Acute osteomyelitis of pelvic region and  thigh (HCC)    Palliative care by specialist    Goals of care, counseling/discussion    Edema of upper extremity    Hyperphosphatemia    Cardiac arrest (HCC)    Sacral wound    Hypoxia    Severe sepsis (HCC) 12/03/2019   Abscess of multiple sites of buttock 12/03/2019   AF (paroxysmal atrial fibrillation) (HCC) 12/03/2019   Metabolic encephalopathy 09/15/2019   Urinary tract infection without hematuria 09/15/2019   Pressure injury of skin 09/10/2019   Morbid obesity with BMI of 40.0-44.9, adult (HCC) 02/27/2018    Orientation RESPIRATION BLADDER Height & Weight     Self, Time  O2 (2L) Continent Weight:   Height:     BEHAVIORAL SYMPTOMS/MOOD NEUROLOGICAL BOWEL NUTRITION STATUS      Continent Diet (See d/c summary)  AMBULATORY STATUS COMMUNICATION OF NEEDS Skin   Total Care Verbally Normal                       Personal Care Assistance Level of Assistance  Bathing, Feeding, Total care Bathing Assistance: Maximum assistance Feeding assistance: Maximum assistance   Total Care Assistance: Maximum assistance   Functional Limitations Info  Sight, Hearing, Speech Sight Info: Impaired Hearing Info: Adequate Speech Info: Adequate    SPECIAL CARE FACTORS FREQUENCY  PT (By licensed PT), OT (By licensed OT)     PT Frequency: 5x a week OT Frequency: 5x a week            Contractures      Additional Factors Info  Code Status, Allergies, Psychotropic, Insulin Sliding Scale Code  Status Info: Full code Allergies Info: Chlorhexidine Gluconate   Vancomycin   Acetazolamide Er Psychotropic Info: See DC summary Insulin Sliding Scale Info: see DC summary       Current Medications (04/23/2021):  This is the current hospital active medication list Current Facility-Administered Medications  Medication Dose Route Frequency Provider Last Rate Last Admin   apixaban (ELIQUIS) tablet 5 mg  5 mg Oral BID Mikey College T, MD   5 mg at 04/23/21 0846   atorvastatin (LIPITOR) tablet  10 mg  10 mg Oral QHS Mikey College T, MD   10 mg at 04/22/21 2136   diltiazem (CARDIZEM) 125 mg in dextrose 5% 125 mL (1 mg/mL) infusion  5-15 mg/hr Intravenous Continuous Chotiner, Claudean Severance, MD 12.5 mL/hr at 04/23/21 0721 12.5 mg/hr at 04/23/21 0721   diphenhydrAMINE-zinc acetate (BENADRYL) 2-0.1 % cream   Topical TID PRN Mikey College T, MD       divalproex (DEPAKOTE) DR tablet 750 mg  750 mg Oral BID Mikey College T, MD   750 mg at 04/23/21 0845   empagliflozin (JARDIANCE) tablet 10 mg  10 mg Oral Q breakfast Mikey College T, MD   10 mg at 04/23/21 0846   ferrous sulfate tablet 324 mg  324 mg Oral Daily Mikey College T, MD   324 mg at 04/23/21 0846   haloperidol lactate (HALDOL) injection 5 mg  5 mg Intravenous Q6H PRN Mikey College T, MD   5 mg at 04/22/21 0857   insulin aspart (novoLOG) injection 0-15 Units  0-15 Units Subcutaneous TID WC Mikey College T, MD   2 Units at 04/23/21 0852   ipratropium-albuterol (DUONEB) 0.5-2.5 (3) MG/3ML nebulizer solution 3 mL  3 mL Nebulization Q6H PRN Sheikh, Omair Latif, DO       lactulose (CHRONULAC) 10 GM/15ML solution 20 g  20 g Oral BID Sheikh, Omair Keats, DO   20 g at 04/23/21 1021   linagliptin (TRADJENTA) tablet 5 mg  5 mg Oral Daily Mikey College T, MD   5 mg at 04/23/21 0846   LORazepam (ATIVAN) injection 1 mg  1 mg Intravenous Q6H PRN Chotiner, Claudean Severance, MD   1 mg at 04/22/21 2143   multivitamin with minerals tablet 1 tablet  1 tablet Oral Daily Marguerita Merles [place], DO   1 tablet at 04/23/21 0845   pantoprazole (PROTONIX) EC tablet 40 mg  40 mg Oral Daily Mikey College T, MD   40 mg at 04/23/21 0845   polyethylene glycol (MIRALAX / GLYCOLAX) packet 17 g  17 g Oral Daily Mikey College T, MD   17 g at 04/23/21 0845   protein supplement (ENSURE MAX) liquid  11 oz Oral BID Marguerita Merles [place], DO   11 oz at 04/23/21 0847   risperiDONE (RISPERDAL M-TABS) disintegrating tablet 1 mg  1 mg Oral Q1400 Estella Husk, MD   1 mg at 04/22/21 1718   risperiDONE (RISPERDAL  M-TABS) disintegrating tablet 2 mg  2 mg Oral BID Mikey College T, MD   2 mg at 04/23/21 0845   senna-docusate (Senokot-S) tablet 2 tablet  2 tablet Oral Daily Emeline General, MD   2 tablet at 04/23/21 0844   spironolactone (ALDACTONE) tablet 25 mg  25 mg Oral Georgia Duff T, MD   25 mg at 04/22/21 0859   tamsulosin (FLOMAX) capsule 0.4 mg  0.4 mg Oral Venda Rodes, MD   0.4 mg at 04/22/21 9024     Discharge Medications:  Please see discharge summary for a list of discharge medications.  Relevant Imaging Results:  Relevant Lab Results:   Additional Information ss#[id number] [place]

## 2021-04-23 NOTE — Progress Notes (Signed)
PROGRESS NOTE    Becky Hendricks  ZOX:[id number] DOB: 1946/06/27 DOA: 04/21/2021 PCP: Roderic Scarce, MD  Brief Narrative:  The patient is a 75 year old obese [place] female with a past medical history significant for blood #2 chronic hypoxic hypercapnic respiratory failure secondary to OSA with noncompliant with BiPAP, obesity, insulin-dependent diabetes mellitus type 2, hypertension, proximal atrial fibrillation on Eliquis, bipolar disorder as well as other comorbidities who was sent from her facility for AMS.  She has been recently hospitalized 2 times in 1 month for similar condition of hypoxic hypercapnic respiratory failure all because she will refuse to wear her BiPAP as well as including 1 recent hospital stay where she was intubated and extubated and discharged 2 days ago.  The morning of admission she was found obtunded in her bed and was not wearing her BiPAP.  She presented ED and showed decompensated acute on chronic HFrEF Hypoxic respiratory failure with a pH of 7.29 and a PCO2 of 100.  After 2 hours of BiPAP she improved to 10.32 with a PCO2 of 95 and was more awake.  Chest x-ray showed similar bilateral lower lung field changes and she became agitated and is now hallucinating significantly confused so psychiatry has been consulted.  Psychiatry recommending Depakote, Risperdal and adding a 1 mg dose at 2:00 pm. Ammonia level and Depakote ordered and Ammonia level was elevated to 101 so will start Lactulose.   Assessment & Plan:   Active Problems:   Acute on chronic respiratory failure with hypoxia and hypercapnia (HCC)   Acute and chronic respiratory failure with hypercapnia (HCC)   Respiratory failure (HCC)  Acute Metabolic Encephalopathy in the setting of acute on chronic hypercapnic hypoxic respiratory failure respiratory acidosis along in the setting of OSA with noncompliance of BiPAP as well as acute on chronic diastolic CHF -She has a significant history of acute on chronic  hypercapnic hypoxic respiratory failure and respiratory acidosis -She is familiar to [facility]  And recently was discharged 3 days ago.  Her brother lives in [place] and she has a very poor prognosis but he wants referral: None she was admitted to the PCU -She continues to be significantly agitated and hallucinating and very upset -She has a short-term and long-term poor prognosis given her repeated failure noncompliance with BiPAP and is at risk for sudden death -ABG    Component Value Date/Time   PHART 7.329 (L) 04/21/2021 1420   PCO2ART 95.2 (HH) 04/21/2021 1420   PO2ART 107 04/21/2021 1420   HCO3 43.1 (H) 04/22/2021 0120   TCO2 >50 (H) 04/21/2021 1420   O2SAT 74.6 04/22/2021 0120  -Continue supplemental oxygen via nasal cannula and BiPAP as tolerated and wean oxygen to baseline -Continuous pulse oximetry maintain O2 saturation greater 90% -SpO2: 98 % O2 Flow Rate (L/min): 2 L/min FiO2 (%): 40 % -Likely her encephalopathy is in the setting of her hypercapnic issues and her chest x-ray showed chronic changes and she has received antibiotics on both admissions recently but will not initiate this time -Patient is allergic to acetazolamide; continue with spironolactone 12 5 mg p.o. every other day and will give a dose of IV Lasix 60 mg given that she appeared volume overloaded; She is still is +1.613 -C/w Duoneb q6hprn Wheezing and SOB -BNP was 154.5 -Continue to monitor respiratory status carefully and continue with BiPAP nightly; Will need BiPAP qHS -Chest x-ray in the a.m. and she will need an ambulatory home O2 screen prior to discharge  Bipolar disorder, hallucinations and significant agitation  and paranoia as well as psychosis -Psychiatry has been consulted for further evaluation recommendations but will need to rule out infection first and checking a urinalysis and urine culture as well as blood cultures x2 given her history of bacteremia -Continue with divalproex 750 mg p.o. twice  daily as well as continuing risperidone 2 mg p.o. twice daily -Has Haldol lactate 5 mg IV every 6 as needed agitation -Further care per Psychiatry  Hyperammonemia -Ammonia Level was 101 and repeat was 23 -Started Lactulose 20 grams BID but decrease to 10 mg BID   Acute on Chronic hypercapnic/hypoxic respiratory failure with respiratory acidosis Acute on chronic diastolic CHF -See as above -She has grade 2 diastolic dysfunction and elevated left atrial pressure -Strict I's and O's and daily weights; She is still +1.6 -We will give a dose of IV Lasix 60 mg and will repeat IV Lasix 40 mg x1 -Continue with BiPAP as needed and nightly -Repeat CXR showed "Slight increase in right-sided pleural effusion with increased adjacent right base airspace disease. Further improvement in left base aeration. Cardiomegaly with improved mild pulmonary venous congestion." -Continue to Monitor for S/Sx of Volume Overload   PAF -C/w Telemetry Monitoring  -C/w Apixaban 5 mg po BID  -Beta-blocker was held in setting of bradycardia -Continue Telemetry Monitoring   Normocytic Anemia -Patient's Hgb/Hct went from 9.3/31.9 -> 10.2/30.0 -> 9.7/31.9 -> 10.5/33.6 -C/w Ferrous Sulfate 324 mg po Daily  -Check Anemia Panel in the AM -Continue to Monitor for S/Sx of Bleeding  -Repeat CBC in the AM   Thrombocytopenia -Patient's Platelet Count has gone from 157 -> 137 -> 148 -Continue to Monitor and Trend Repeat CMP int eh AM   Insulin-dependent diabetes Mellitus Type 2 -Continue with moderate NovoLog/scale insulin as well as linagliptin 5 mg p.o. daily as well as empagliflozin 10 mg p.o. daily -Continue monitor CBGs; CBG's ranging from 132-182  GERD/GI prophylaxis -Continue with Pantoprazole 40 once daily  Hyperlipidemia -C/w Atorvastatin 10 mg po qHS  Hypertension -Blood pressures been elevated -Given a dose of IV Lasix yesterday and will give another dose of IV 40 mg today  -Her beta-blocker is being  held currently -Continue monitor blood pressures per protocol -We will add IV hydralazine -Last BP reading was 121/52  Obesity -Complicates overall prognosis and care -Estimated body mass index is 37.68 kg/m as calculated from the following:   Height as of 04/14/21: 5\' 6"  (1.676 m).   Weight as of 04/19/21: 105.9 kg. -Weight Loss and Dietary Counseling  -Nutritionist consulted and recommending Ensure Max po BID and MVI+ Minerals Daily    DVT prophylaxis: Anticoagulated with Apixaban Code Status: FULL CODE Family Communication: No family present at bedside  Disposition Plan: Pending further clinical improvement   Status is: Inpatient  Remains inpatient appropriate because:Unsafe d/c plan, IV treatments appropriate due to intensity of illness or inability to take PO, and Inpatient level of care appropriate due to severity of illness  Dispo: The patient is from: SNF              Anticipated d/c is to: SNF              Patient currently is not medically stable to d/c.   Difficult to place patient No  Consultants:  Psychiatry   Procedures: None  Antimicrobials:  Anti-infectives (From admission, onward)    None        Subjective: Seen and examined at bedside and was somnolent and drowsy and asleep this AM but nursing  states she was agitated and awake this afternoon eating her lunch and was very labile yelling and screaming and then crying.  Ammonia level was elevated yesterday however on repeat is improved.  We will start low-dose lactulose.  Chest x-ray shows improvement so we will continue diuresis.  No other concerns or complaints at this time.  Objective: Vitals:   04/23/21 0800 04/23/21 1000 04/23/21 1100 04/23/21 1227  BP: (!) 114/56 (!) 104/53  (!) 121/52  Pulse: (!) 111 (!) 112 100 (!) 108  Resp: (!) 28 14  (!) 24  Temp: 98.6 F (37 C)   98.4 F (36.9 C)  TempSrc: Oral   Oral  SpO2:        Intake/Output Summary (Last 24 hours) at 04/23/2021 1353 Last data filed  at 04/23/2021 0300 Gross per 24 hour  Intake 1493.12 ml  Output --  Net 1493.12 ml    There were no vitals filed for this visit.  Examination: Physical Exam:  Constitutional: WN/WD obese female in NAD and appears calm and comfortable resting asleep Eyes:  Lids and conjunctivae normal, sclerae anicteric  ENMT: External Ears, Nose appear normal. Grossly normal hearing.  Neck: Appears normal, supple, no cervical masses, normal ROM, no appreciable thyromegaly; no JVD Respiratory: Diminished to auscultation bilaterally, no wheezing, rales, rhonchi or crackles. Normal respiratory effort and patient is not tachypenic. No accessory muscle use. Wearing Supplemental O2 via  Cardiovascular: Tachycardic rate, no murmurs / rubs / gallops. S1 and S2 auscultated. 1+ LE edema  Abdomen: Soft, non-tender, non-distended. Bowel sounds positive.  GU: Deferred. Musculoskeletal: No clubbing / cyanosis of digits/nails. No joint deformity upper and lower extremities.  Skin: No rashes, lesions, ulcers on a limited skin evaluation. No induration; Warm and dry.  Neurologic: Somnolent and Drowsy and and asleep Psychiatric: Impaired judgment and insight. Appears calm aslsep    Data Reviewed: I have personally reviewed following labs and imaging studies  CBC: Recent Labs  Lab 04/21/21 0944 04/21/21 0956 04/21/21 1420 04/22/21 0912 04/23/21 0336  WBC 6.6  --   --  6.6 7.1  NEUTROABS  --   --   --  3.6 3.5  HGB 9.9* 10.2* 10.2* 9.7* 10.5*  HCT 34.1* 30.0* 30.0* 31.9* 33.6*  MCV 97.2  --   --  92.5 91.6  PLT 157  --   --  137* 148*    Basic Metabolic Panel: Recent Labs  Lab 04/21/21 0956 04/21/21 1420 04/22/21 0912 04/23/21 0336  NA 140 141 138 139  K 4.4 4.1 3.8 3.6  CL  --   --  93* 92*  CO2  --   --  38* 36*  GLUCOSE  --   --  132* 164*  BUN  --   --  14 16  CREATININE  --   --  0.47 0.74  CALCIUM  --   --  8.3* 8.6*  MG  --   --  1.9 1.9  PHOS  --   --  2.5 3.5    GFR: Estimated  Creatinine Clearance: 77 mL/min (by C-G formula based on SCr of 0.74 mg/dL). Liver Function Tests: Recent Labs  Lab 04/22/21 0912 04/23/21 0336  AST 8* 8*  ALT 7 7  ALKPHOS 33* 33*  BILITOT 0.8 0.3  PROT 5.0* 5.3*  ALBUMIN 2.2* 2.3*    No results for input(s): LIPASE, AMYLASE in the last 168 hours. Recent Labs  Lab 04/22/21 1658 04/23/21 0851  AMMONIA 101* 23   Coagulation Profile:  No results for input(s): INR, PROTIME in the last 168 hours. Cardiac Enzymes: No results for input(s): CKTOTAL, CKMB, CKMBINDEX, TROPONINI in the last 168 hours. BNP (last 3 results) No results for input(s): PROBNP in the last 8760 hours. HbA1C: No results for input(s): HGBA1C in the last 72 hours. CBG: Recent Labs  Lab 04/22/21 1624 04/22/21 2115 04/23/21 0033 04/23/21 0824 04/23/21 1226  GLUCAP 217* 182* 149* 150* 132*   Lipid Profile: No results for input(s): CHOL, HDL, LDLCALC, TRIG, CHOLHDL, LDLDIRECT in the last 72 hours. Thyroid Function Tests: No results for input(s): TSH, T4TOTAL, FREET4, T3FREE, THYROIDAB in the last 72 hours. Anemia Panel: No results for input(s): VITAMINB12, FOLATE, FERRITIN, TIBC, IRON, RETICCTPCT in the last 72 hours. Sepsis Labs: No results for input(s): PROCALCITON, LATICACIDVEN in the last 168 hours.  Recent Results (from the past 240 hour(s))  Resp Panel by RT-PCR (Flu A&B, Covid) Nasopharyngeal Swab     Status: None   Collection Time: 04/18/21 12:18 PM   Specimen: Nasopharyngeal Swab; Nasopharyngeal(NP) swabs in vial transport medium  Result Value Ref Range Status   SARS Coronavirus 2 by RT PCR NEGATIVE NEGATIVE Final    Comment: (NOTE) SARS-CoV-2 target nucleic acids are NOT DETECTED.  The SARS-CoV-2 RNA is generally detectable in upper respiratory specimens during the acute phase of infection. The lowest concentration of SARS-CoV-2 viral copies this assay can detect is 138 copies/mL. A negative result does not preclude SARS-Cov-2 infection  and should not be used as the sole basis for treatment or other patient management decisions. A negative result may occur with  improper specimen collection/handling, submission of specimen other than nasopharyngeal swab, presence of viral mutation(s) within the areas targeted by this assay, and inadequate number of viral copies(<138 copies/mL). A negative result must be combined with clinical observations, patient history, and epidemiological information. The expected result is Negative.  Fact Sheet for Patients:  BloggerCourse.com  Fact Sheet for Healthcare Providers:  SeriousBroker.it  This test is no t yet approved or cleared by the [place] FDA and  has been authorized for detection and/or diagnosis of SARS-CoV-2 by FDA under an Emergency Use Authorization (EUA). This EUA will remain  in effect (meaning this test can be used) for the duration of the COVID-19 declaration under Section 564(b)(1) of the Act, 21 U.S.C.section 360bbb-3(b)(1), unless the authorization is terminated  or revoked sooner.       Influenza A by PCR NEGATIVE NEGATIVE Final   Influenza B by PCR NEGATIVE NEGATIVE Final    Comment: (NOTE) The Xpert Xpress SARS-CoV-2/FLU/RSV plus assay is intended as an aid in the diagnosis of influenza from Nasopharyngeal swab specimens and should not be used as a sole basis for treatment. Nasal washings and aspirates are unacceptable for Xpert Xpress SARS-CoV-2/FLU/RSV testing.  Fact Sheet for Patients: BloggerCourse.com  Fact Sheet for Healthcare Providers: SeriousBroker.it  This test is not yet approved or cleared by the [place] FDA and has been authorized for detection and/or diagnosis of SARS-CoV-2 by FDA under an Emergency Use Authorization (EUA). This EUA will remain in effect (meaning this test can be used) for the duration of the COVID-19 declaration  under Section 564(b)(1) of the Act, 21 U.S.C. section 360bbb-3(b)(1), unless the authorization is terminated or revoked.  Performed at [facility], [address] [place], [place] [address]   Resp Panel by RT-PCR (Flu A&B, Covid) Nasopharyngeal Swab     Status: None   Collection Time: 04/21/21 10:18 AM   Specimen: Nasopharyngeal  Swab; Nasopharyngeal(NP) swabs in vial transport medium  Result Value Ref Range Status   SARS Coronavirus 2 by RT PCR NEGATIVE NEGATIVE Final    Comment: (NOTE) SARS-CoV-2 target nucleic acids are NOT DETECTED.  The SARS-CoV-2 RNA is generally detectable in upper respiratory specimens during the acute phase of infection. The lowest concentration of SARS-CoV-2 viral copies this assay can detect is 138 copies/mL. A negative result does not preclude SARS-Cov-2 infection and should not be used as the sole basis for treatment or other patient management decisions. A negative result may occur with  improper specimen collection/handling, submission of specimen other than nasopharyngeal swab, presence of viral mutation(s) within the areas targeted by this assay, and inadequate number of viral copies(<138 copies/mL). A negative result must be combined with clinical observations, patient history, and epidemiological information. The expected result is Negative.  Fact Sheet for Patients:  BloggerCourse.com  Fact Sheet for Healthcare Providers:  SeriousBroker.it  This test is no t yet approved or cleared by the [place] FDA and  has been authorized for detection and/or diagnosis of SARS-CoV-2 by FDA under an Emergency Use Authorization (EUA). This EUA will remain  in effect (meaning this test can be used) for the duration of the COVID-19 declaration under Section 564(b)(1) of the Act, 21 U.S.C.section 360bbb-3(b)(1), unless the authorization is terminated  or revoked sooner.       Influenza A by PCR  NEGATIVE NEGATIVE Final   Influenza B by PCR NEGATIVE NEGATIVE Final    Comment: (NOTE) The Xpert Xpress SARS-CoV-2/FLU/RSV plus assay is intended as an aid in the diagnosis of influenza from Nasopharyngeal swab specimens and should not be used as a sole basis for treatment. Nasal washings and aspirates are unacceptable for Xpert Xpress SARS-CoV-2/FLU/RSV testing.  Fact Sheet for Patients: BloggerCourse.com  Fact Sheet for Healthcare Providers: SeriousBroker.it  This test is not yet approved or cleared by the [place] FDA and has been authorized for detection and/or diagnosis of SARS-CoV-2 by FDA under an Emergency Use Authorization (EUA). This EUA will remain in effect (meaning this test can be used) for the duration of the COVID-19 declaration under Section 564(b)(1) of the Act, 21 U.S.C. section 360bbb-3(b)(1), unless the authorization is terminated or revoked.  Performed at [facility], [address], [place], [place] [address]      RN Pressure Injury Documentation: Pressure Injury 04/21/21 Sacrum Medial Unstageable - Full thickness tissue loss in which the base of the injury is covered by slough (yellow, tan, gray, green or brown) and/or eschar (tan, brown or black) in the wound bed. yellow, pink, craterous (Active)  04/21/21 1720  Location: Sacrum  Location Orientation: Medial  Staging: Unstageable - Full thickness tissue loss in which the base of the injury is covered by slough (yellow, tan, gray, green or brown) and/or eschar (tan, brown or black) in the wound bed.  Wound Description (Comments): yellow, pink, craterous  Present on Admission: Yes     Estimated body mass index is 37.68 kg/m as calculated from the following:   Height as of 04/14/21:  (1.676 m).   Weight as of 04/19/21: 105.9 kg.  Malnutrition Type:  Nutrition Problem: Increased nutrient needs Etiology: wound healing  Malnutrition  Characteristics:  Signs/Symptoms: estimated needs  Nutrition Interventions:  Interventions: MVI, Ensure Enlive (each supplement provides 350kcal and 20 grams of protein)   Radiology Studies: DG CHEST PORT 1 VIEW  Result Date: 04/23/2021 CLINICAL DATA:  Evaluate atelectasis and right pleural effusion EXAM: PORTABLE  CHEST 1 VIEW COMPARISON:  04/22/2021 FINDINGS: Midline trachea. Cardiomegaly accentuated by AP portable technique. Increase in small to moderate right pleural effusion. No pneumothorax. Pulmonary interstitial prominence persists. Persistent right base airspace disease. Improved left-sided aeration. IMPRESSION: Slight increase in right-sided pleural effusion with increased adjacent right base airspace disease. Further improvement in left base aeration. Cardiomegaly with improved mild pulmonary venous congestion. Electronically Signed   By: Jeronimo GreavesKyle  Talbot M.D.   On: 04/23/2021 10:20   DG Chest Port 1 View  Result Date: 04/22/2021 CLINICAL DATA:  Hypoxia EXAM: PORTABLE CHEST 1 VIEW COMPARISON:  Yesterday FINDINGS: Mildly degraded exam due to AP portable technique and patient body habitus. Patient rotated to the right. Moderate cardiomegaly, accentuated by AP portable technique. Small right pleural effusion persists. No pneumothorax. Pulmonary interstitial thickening. improved left base aeration with persistent right lower lobe consolidation. IMPRESSION: Improved left base aeration. Persistent right base atelectasis or infection with adjacent small right pleural effusion. Cardiomegaly with interstitial thickening, for which pulmonary venous congestion is a concern. Electronically Signed   By: Jeronimo GreavesKyle  Talbot M.D.   On: 04/22/2021 10:46    Scheduled Meds:  apixaban  5 mg Oral BID   atorvastatin  10 mg Oral QHS   divalproex  750 mg Oral BID   empagliflozin  10 mg Oral Q breakfast   ferrous sulfate  324 mg Oral Daily   insulin aspart  0-15 Units Subcutaneous TID WC   lactulose  20 g Oral BID    linagliptin  5 mg Oral Daily   multivitamin with minerals  1 tablet Oral Daily   pantoprazole  40 mg Oral Daily   polyethylene glycol  17 g Oral Daily   Ensure Max Protein  11 oz Oral BID   risperiDONE  1 mg Oral Q1400   risperiDONE  2 mg Oral BID   senna-docusate  2 tablet Oral Daily   spironolactone  25 mg Oral QODAY   tamsulosin  0.4 mg Oral QODAY   Continuous Infusions:  diltiazem (CARDIZEM) infusion 12.5 mg/hr (04/23/21 0721)    LOS: 2 days   Merlene Laughtermair Latif Laritza Vokes, DO [facility] PAGER is on AMION  If 7PM-7AM, please contact night-coverage [email or web address]

## 2021-04-24 DIAGNOSIS — I4891 Unspecified atrial fibrillation: Secondary | ICD-10-CM | POA: Diagnosis not present

## 2021-04-24 DIAGNOSIS — I5033 Acute on chronic diastolic (congestive) heart failure: Secondary | ICD-10-CM

## 2021-04-24 DIAGNOSIS — E7220 Disorder of urea cycle metabolism, unspecified: Secondary | ICD-10-CM | POA: Diagnosis not present

## 2021-04-24 DIAGNOSIS — I4811 Longstanding persistent atrial fibrillation: Secondary | ICD-10-CM | POA: Diagnosis not present

## 2021-04-24 DIAGNOSIS — J9622 Acute and chronic respiratory failure with hypercapnia: Secondary | ICD-10-CM

## 2021-04-24 DIAGNOSIS — J9621 Acute and chronic respiratory failure with hypoxia: Secondary | ICD-10-CM | POA: Diagnosis not present

## 2021-04-24 LAB — COMPREHENSIVE METABOLIC PANEL
ALT: 9 U/L (ref 0–44)
AST: 9 U/L — ABNORMAL LOW (ref 15–41)
Albumin: 2.3 g/dL — ABNORMAL LOW (ref 3.5–5.0)
Alkaline Phosphatase: 30 U/L — ABNORMAL LOW (ref 38–126)
Anion gap: 10 (ref 5–15)
BUN: 19 mg/dL (ref 8–23)
CO2: 38 mmol/L — ABNORMAL HIGH (ref 22–32)
Calcium: 8.6 mg/dL — ABNORMAL LOW (ref 8.9–10.3)
Chloride: 94 mmol/L — ABNORMAL LOW (ref 98–111)
Creatinine, Ser: 0.74 mg/dL (ref 0.44–1.00)
GFR, Estimated: >60 mL/min (ref >60)
Glucose, Bld: 165 mg/dL — ABNORMAL HIGH (ref 70–99)
Potassium: 3.6 mmol/L (ref 3.5–5.1)
Sodium: 142 mmol/L (ref 135–145)
Total Bilirubin: 1.1 mg/dL (ref 0.3–1.2)
Total Protein: 5.4 g/dL — ABNORMAL LOW (ref 6.5–8.1)

## 2021-04-24 LAB — CBC WITH DIFFERENTIAL/PLATELET
Abs Immature Granulocytes: 0.07 10*3/uL (ref 0.00–0.07)
Basophils Absolute: 0 10*3/uL (ref 0.0–0.1)
Basophils Relative: 0 %
Eosinophils Absolute: 0.2 10*3/uL (ref 0.0–0.5)
Eosinophils Relative: 2 %
HCT: 36.9 % (ref 36.0–46.0)
Hemoglobin: 11.1 g/dL — ABNORMAL LOW (ref 12.0–15.0)
Immature Granulocytes: 1 %
Lymphocytes Relative: 17 %
Lymphs Abs: 1.5 10*3/uL (ref 0.7–4.0)
MCH: 28.3 pg (ref 26.0–34.0)
MCHC: 30.1 g/dL (ref 30.0–36.0)
MCV: 94.1 fL (ref 80.0–100.0)
Monocytes Absolute: 0.8 10*3/uL (ref 0.1–1.0)
Monocytes Relative: 9 %
Neutro Abs: 6.1 10*3/uL (ref 1.7–7.7)
Neutrophils Relative %: 71 %
Platelets: 132 10*3/uL — ABNORMAL LOW (ref 150–400)
RBC: 3.92 MIL/uL (ref 3.87–5.11)
RDW: 15 % (ref 11.5–15.5)
WBC: 8.7 10*3/uL (ref 4.0–10.5)
nRBC: 0 % (ref 0.0–0.2)

## 2021-04-24 LAB — PHOSPHORUS: Phosphorus: 4.5 mg/dL (ref 2.5–4.6)

## 2021-04-24 LAB — GLUCOSE, CAPILLARY
Glucose-Capillary: 180 mg/dL — ABNORMAL HIGH (ref 70–99)
Glucose-Capillary: 146 mg/dL — ABNORMAL HIGH (ref 70–99)
Glucose-Capillary: 116 mg/dL — ABNORMAL HIGH (ref 70–99)
Glucose-Capillary: 195 mg/dL — ABNORMAL HIGH (ref 70–99)

## 2021-04-24 LAB — MAGNESIUM: Magnesium: 2.1 mg/dL (ref 1.7–2.4)

## 2021-04-24 MED ORDER — CHLORHEXIDINE GLUCONATE CLOTH 2 % EX PADS
6.0000 | MEDICATED_PAD | Freq: Every day | CUTANEOUS | Status: DC
  Administered 2021-04-25: 6 via TOPICAL

## 2021-04-24 MED ORDER — FUROSEMIDE 10 MG/ML IJ SOLN
40.0000 mg | Freq: Once | INTRAMUSCULAR | Status: AC
  Administered 2021-04-24: 40 mg via INTRAVENOUS
  Filled 2021-04-24: qty 4

## 2021-04-24 MED ORDER — METOPROLOL TARTRATE 12.5 MG HALF TABLET
12.5000 mg | ORAL_TABLET | Freq: Two times a day (BID) | ORAL | Status: DC
  Administered 2021-04-24: 12.5 mg via ORAL
  Filled 2021-04-24: qty 1

## 2021-04-24 MED ORDER — MUPIROCIN 2 % EX OINT
1.0000 "application " | TOPICAL_OINTMENT | Freq: Two times a day (BID) | CUTANEOUS | Status: AC
  Administered 2021-04-24 – 2021-04-29 (×9): 1 via NASAL
  Filled 2021-04-24: qty 22

## 2021-04-24 MED ORDER — METOPROLOL TARTRATE 12.5 MG HALF TABLET: 12.5000 mg | ORAL_TABLET | Freq: Two times a day (BID) | ORAL | Status: DC

## 2021-04-24 MED ORDER — LACTATED RINGERS IV BOLUS
500.0000 mL | Freq: Once | INTRAVENOUS | Status: AC
  Administered 2021-04-24: 500 mL via INTRAVENOUS

## 2021-04-24 NOTE — Consult Note (Addendum)
Cardiology Consultation:   Patient ID: Becky Hendricks MRN: [id number]; DOB: 04-05-1946  Admit date: 04/21/2021 Date of Consult: 04/24/2021  PCP:  Roderic Scarce, MD   [facility] Providers Cardiologist:  None   {   Patient Profile:   Becky Hendricks is a 75 y.o. female with a hx of chronic hypoxic respiratory failure, OSA with noncompliance to BiPAP, chronic diastolic heart failure, bipolar disorder, paroxysmal atrial fibrillation, chronic normocytic anemia, thrombocytopenia, type 2 diabetes, GERD, hypertension, hyperlipidemia, obesity, decubitus ulcer, who is being seen 04/24/2021 for the evaluation of atrial fibrillation at the request of Dr. Marland Mcalpine.  History of Present Illness:   Becky Hendricks does not follow-up with cardiology outpatient.  She suffered a cardiac arrest 12/2019, cardiology evaluated her during an inpatient consultation, etiology for arrest and elevated trop (161 ranges) was felt due to severe hypotension in the setting of sepsis 2/2 sacral ulcer with osteomyelitis.  Echo from 12/23/2019 revealed EF 65 to 70%, no RMWA, grade II DD, RV systolic function moderately reduced, moderately elevated PASP with RVSP 59.6, mild dilated LA, moderately dilated RA, mild to moderate aortic sclerosis.  She was recommended outpatient ischemic evaluation when acute infection resolves.  She was hospitalized 11/17/20 to 12/02/2020 for acute toxic encephalopathy due to MRSA bacteremia and psychosis from bipolar disorder.  She was hospitalized 03/29/2021 to 04/07/2021 for acute on chronic hypoxic hypercarbic respiratory failure requiring mechanical ventilation.  She was also treated for sepsis 2/2 Proteus mirabilis UTI and possible pneumonia. Concern for OSA/OHS, recommended nightly BiPAP and outpatient sleep study.    She was hospitalized 04/10/21-04/19/21 for acute on chronic hypoxic hypercarbic respiratory failure 2/2 decompensation of OHS/OSA 2/2 noncompliance with BiPAP.   She returned to the ER on  04/21/2021 shortly after last discharge with acute encephalopathy and acute on chronic hypoxic respiratory failure 2/2 noncompliance with BIPAP at SNF.  She continued to refuse BiPAP despite recurrent discussion regarding this matter by primary team and had threatened to leave AMA at one point.  Psychiatry has been consulted, recommended antipsychotic regimen changes for bipolar disorder, subsequent work-up also found hyperammoniemia and was given lactulose.    She was also concerned for volume overload from diastolic heart failure, BNP 154 from 04/21/2021, CXR from 04/23/21 showed slight increase in right-sided pleural effusion with increased adjacent right base airspace disease. She was given IV Lasix bolus as well as p.o. spironolactone for diuresis.    She was continued on Eliquis for anticoagulation for paroxysmal A. fib, beta-blocker was held given bradycardia at admission,started having A fib RVR with HR up to 160s, metoprolol 12.5mg  BID was resumed and she was also started on cardizem gtt, and cardiology is requested for further input today.  Last available Echo from 04/03/2021 revealed EF 60 to 65%, no RWMA, grade 2 DD, elevated LA pressure, moderately elevated PASP with RVSP at 47.12mmHg, no significant valvular disease.  During encounter, she is a poor historian, states she did not sleep well last night, frequently fall asleep during encounter.  She denies any chest pain or dizziness.  She is unclear if she has any heart palpitation or heart racing sensation. She is unable to elaborate history.  She does not remember when is the last time she was out of bed and walking.  She was noted with frequent rhonchorous cough on exam.      Past Medical History:  Diagnosis Date   Atrial fibrillation (HCC)    Bipolar disorder (HCC)    Bipolar disorder (HCC) 04/27/2020   Clostridium difficile  diarrhea 08/02/2019   Dehydration    Essential hypertension    Fall 02/11/2020   Morbid obesity (HCC)     Osteomyelitis (HCC) 12/03/2019   Pneumonia due to COVID-19 virus 09/15/2019   Type 2 diabetes mellitus (HCC)    Weakness     Past Surgical History:  Procedure Laterality Date   INCISION AND DRAINAGE PERIRECTAL ABSCESS Left 12/04/2019   Procedure: IRRIGATION AND DEBRIDEMENT LEFT BUTTOCK ABSCESS;  Surgeon: Violeta Gelinas, MD;  [facility] OR;  Service: General;  Laterality: Left;   INCISION AND DRAINAGE PERIRECTAL ABSCESS Left 12/10/2019   Procedure: REPEAT IRRIGATION AND DEBRIDEMENT BUTTOCK  ABSCESS;  Surgeon: Abigail Miyamoto, MD;  Location: [facility] OR;  Service: General;  Laterality: Left;   IR FLUORO GUIDE CV LINE RIGHT  11/25/2020   IR RADIOLOGIST EVAL & MGMT  12/29/2020   IR US GUIDE VASC ACCESS RIGHT  11/25/2020     Home Medications:  Prior to Admission medications   Medication Sig Start Date End Date Taking? Authorizing Provider  apixaban (ELIQUIS) 5 MG TABS tablet Take 5 mg by mouth 2 (two) times daily.   Yes [provider]  atorvastatin (LIPITOR) 10 MG tablet Take 10 mg by mouth at bedtime.   Yes [provider]  calcium carbonate (TUMS - DOSED IN MG ELEMENTAL CALCIUM) 500 MG chewable tablet Chew 500 mg by mouth daily.   Yes [provider]  diphenhydrAMINE (BENADRYL) 2 % cream Apply 1 application topically 3 (three) times daily as needed for itching.   Yes [provider]  divalproex (DEPAKOTE) 250 MG DR tablet Take 3 tablets (750 mg total) by mouth 2 (two) times daily. 04/19/21  Yes Esaw Grandchild A, DO  docusate sodium (COLACE) 100 MG capsule Take 1 capsule (100 mg total) by mouth 2 (two) times daily as needed for mild constipation. 01/05/20  Yes Regalado, Belkys A, MD  empagliflozin (JARDIANCE) 10 MG TABS tablet Take 10 mg by mouth daily.   Yes [provider]  ferrous sulfate 324 MG TBEC Take 324 mg by mouth daily.   Yes [provider]  insulin lispro (HUMALOG) 100 UNIT/ML injection Inject 2-12 Units into the skin in the morning and  at bedtime. CBG 70 - 200: 0 units CBG 201 - 250: 2 units CBG 251 - 300: 4 units CBG 301 - 350: 6 units CBG 351 - 400: 8 units CBG 401 - 450: 10 units CBG 451 - 600: 12 units If CBG is > 450, give 12 units, recheck in 2 hours. If still >350, notify provider   Yes [provider]  Ipratropium-Albuterol (COMBIVENT RESPIMAT) 20-100 MCG/ACT AERS respimat Inhale 1 puff into the lungs every 12 (twelve) hours.   Yes [provider]  JANUVIA 50 MG tablet Take 50 mg by mouth daily. 03/27/21  Yes [provider]  loperamide (IMODIUM) 2 MG capsule Take 1 capsule (2 mg total) by mouth as needed for diarrhea or loose stools. 04/19/21  Yes Esaw Grandchild A, DO  metoprolol tartrate (LOPRESSOR) 25 MG tablet Take 0.5 tablets (12.5 mg total) by mouth 2 (two) times daily. Hold if SBP<110 or DBP<60 04/19/21  Yes Esaw Grandchild A, DO  Multiple Vitamin (MULTIVITAMIN WITH MINERALS) TABS tablet Take 1 tablet by mouth daily.   Yes [provider]  pantoprazole (PROTONIX) 40 MG tablet Take 1 tablet (40 mg total) by mouth daily. 04/07/21  Yes Glade Lloyd, MD  polyethylene glycol (MIRALAX / GLYCOLAX) 17 g packet Take 17 g by mouth  daily. 04/07/21  Yes Glade Lloyd, MD  risperiDONE (RISPERDAL M-TABS) 1 MG disintegrating tablet Take 1 tablet (1 mg total) by mouth daily. 04/19/21  Yes Esaw Grandchild A, DO  risperiDONE (RISPERDAL M-TABS) 2 MG disintegrating tablet Take 1 tablet (2 mg total) by mouth 2 (two) times daily. 04/19/21  Yes Esaw Grandchild A, DO  sennosides-docusate sodium (SENOKOT-S) 8.6-50 MG tablet Take 2 tablets by mouth daily. Hold if having loose or frequent stools 04/19/21  Yes Esaw Grandchild A, DO  spironolactone (ALDACTONE) 25 MG tablet Take 25 mg by mouth every other day.   Yes [provider]  tamsulosin (FLOMAX) 0.4 MG CAPS capsule Take 1 capsule (0.4 mg total) by mouth every other day. 04/20/21  Yes Pennie Banter, DO    Inpatient Medications: Scheduled Meds:   apixaban  5 mg Oral BID   atorvastatin  10 mg Oral QHS   divalproex  750 mg Oral BID   empagliflozin  10 mg Oral Q breakfast   ferrous sulfate  324 mg Oral Daily   insulin aspart  0-15 Units Subcutaneous TID WC   lactulose  10 g Oral BID   linagliptin  5 mg Oral Daily   metoprolol tartrate  12.5 mg Oral BID   multivitamin with minerals  1 tablet Oral Daily   pantoprazole  40 mg Oral Daily   polyethylene glycol  17 g Oral Daily   Ensure Max Protein  11 oz Oral BID   risperiDONE  1 mg Oral Q1400   risperiDONE  2 mg Oral BID   senna-docusate  2 tablet Oral Daily   spironolactone  25 mg Oral QODAY   tamsulosin  0.4 mg Oral QODAY   Continuous Infusions:  diltiazem (CARDIZEM) infusion 12.5 mg/hr (04/24/21 1531)   PRN Meds: diphenhydrAMINE-zinc acetate, ipratropium-albuterol, LORazepam  Allergies:    Allergies  Allergen Reactions   Chlorhexidine Gluconate Itching   Vancomycin     Vancomycin infusion related reaction- May need Vanc to be given at a slower rate    Acetazolamide Er Rash    Social History:   Social History   Socioeconomic History   Marital status: Single    Spouse name: Not on file   Number of children: Not on file   Years of education: Not on file   Highest education level: Not on file  Occupational History   Not on file  Tobacco Use   Smoking status: Never   Smokeless tobacco: Never  Vaping Use   Vaping Use: Unknown  Substance and Sexual Activity   Alcohol use: Not Currently   Drug use: Not Currently   Sexual activity: Not Currently    Birth control/protection: Post-menopausal  Other Topics Concern   Not on file  Social History Narrative   Not on file   Social Determinants of Health   Financial Resource Strain: Not on file  Food Insecurity: Not on file  Transportation Needs: Not on file  Physical Activity: Not on file  Stress: Not on file  Social Connections: Not on file  Intimate Partner Violence: Not on file    Family History:   Patient  is a poor historian, with encephalopathy, unable to provide family history   ROS:  ROS is limited as patient is a poor historian with encephalopathy at this time  Physical Exam/Data:   Vitals:   04/24/21 1300 04/24/21 1400 04/24/21 1500 04/24/21 1510  BP: (!) 98/49 (!) 113/56 (!) 131/58   Pulse: (!) 107 (!) 115 (!) 127 Marland Kitchen)  117  Resp:      Temp:      TempSrc:      SpO2: 90% 95% 95% 95%    Intake/Output Summary (Last 24 hours) at 04/24/2021 1545 Last data filed at 04/24/2021 1400 Gross per 24 hour  Intake 271.97 ml  Output 1800 ml  Net -1528.03 ml   Last 3 Weights 04/19/2021 04/18/2021 04/17/2021  Weight (lbs) 233 lb 7.5 oz 234 lb 12.6 oz 236 lb 5.3 oz  Weight (kg) 105.9 kg 106.5 kg 107.2 kg     There is no height or weight on file to calculate BMI.   Vitals:  Vitals:   04/24/21 1500 04/24/21 1510  BP: (!) 131/58   Pulse: (!) 127 (!) 117  Resp:    Temp:    SpO2: 95% 95%   General Appearance: In no apparent distress, laying in bed, chronically ill-appearing, morbidly obese  HEENT: Normocephalic, atraumatic.  Eyes remains closed during conversation Neck: Short and thick neck, difficult to assess JVD Cardiovascular: Irregularly irregular, S1-S2, no murmur  Respiratory: Resting breathing unlabored, rhonchorous lung sounds bilaterally and diminished at base to auscultation, on 2LNC with pox 95%, no use of accessory muscle Gastrointestinal: Bowel sounds positive, abdomen soft, non-tender, non-distended.  Extremities: Unable to move bilateral lower extremity on demand, ? due to not following commands, BLE trace edema , BUE dependent edema Genitourinary: genital exam not performed Musculoskeletal: Normal muscle bulk and tone Skin: Intact, warm, dry. No rashes or petechiae noted in exposed areas.  Neurologic: Opens eyes to loud voice, fall asleep frequently during encounter, oriented to self, does not follow commands, limited insight, unable to elaborate history.   Psychiatric:  Calm  EKG:  The EKG was personally reviewed and demonstrates:    Admission EKG from 04/21/2021 revealed sinus rhythm with ventricular rate of 77 bpm  Repeat EKG from 04/22/2021 revealed A. fib with RVR with ventricular rate of 141 bpm  Telemetry:  Telemetry was personally reviewed and demonstrates:   Atrial fibrillation with RVR with rates of 1 10-1 20s, ST transition to A fib RVR with rate up to 160s since 04/22/21 at 2000   Relevant CV Studies:  Last echocardiogram from 04/03/2021:   1. Technically difficult study. Left ventricular ejection fraction, by  estimation, is 60 to 65%. The left ventricle has normal function. The left  ventricle has no regional wall motion abnormalities. There is moderate  asymmetric left ventricular  hypertrophy of the basal-septal segment.      Left ventricular diastolic parameters are consistent with Grade II  diastolic dysfunction (pseudonormalization). Elevated left atrial  pressure.   2. Right ventricule is poorly visualized but grossly normal size and  systolic function. There is moderately elevated pulmonary artery systolic  pressure. The estimated right ventricular systolic pressure is 47.7 mmHg.   3. The mitral valve is normal in structure. No evidence of mitral valve  regurgitation.   4. The aortic valve is tricuspid. Aortic valve regurgitation is not  visualized. No aortic stenosis is present.    Laboratory Data:  High Sensitivity Troponin:   Recent Labs  Lab 03/29/21 1807 04/03/21 1333 04/04/21 0553 04/10/21 1017 04/10/21 1226  TROPONINIHS 7 7 9  34* 35*     Chemistry Recent Labs  Lab 04/22/21 0912 04/23/21 0336 04/24/21 1051  NA 138 139 142  K 3.8 3.6 3.6  CL 93* 92* 94*  CO2 38* 36* 38*  GLUCOSE 132* 164* 165*  BUN 14 16 19   CREATININE 0.47 0.74 0.74  CALCIUM  8.3* 8.6* 8.6*  GFRNONAA >60 >60 >60  ANIONGAP 7 11 10     Recent Labs  Lab 04/22/21 0912 04/23/21 0336 04/24/21 1051  PROT 5.0* 5.3* 5.4*  ALBUMIN 2.2*  2.3* 2.3*  AST 8* 8* 9*  ALT 7 7 9   ALKPHOS 33* 33* 30*  BILITOT 0.8 0.3 1.1   Hematology Recent Labs  Lab 04/22/21 0912 04/23/21 0336 04/24/21 1051  WBC 6.6 7.1 8.7  RBC 3.45* 3.67* 3.92  HGB 9.7* 10.5* 11.1*  HCT 31.9* 33.6* 36.9  MCV 92.5 91.6 94.1  MCH 28.1 28.6 28.3  MCHC 30.4 31.3 30.1  RDW 14.4 14.6 15.0  PLT 137* 148* 132*   BNP Recent Labs  Lab 04/21/21 0944  BNP 154.5*    DDimer No results for input(s): DDIMER in the last 168 hours.   Radiology/Studies:  DG CHEST PORT 1 VIEW  Result Date: 04/23/2021 CLINICAL DATA:  Evaluate atelectasis and right pleural effusion EXAM: PORTABLE CHEST 1 VIEW COMPARISON:  04/22/2021 FINDINGS: Midline trachea. Cardiomegaly accentuated by AP portable technique. Increase in small to moderate right pleural effusion. No pneumothorax. Pulmonary interstitial prominence persists. Persistent right base airspace disease. Improved left-sided aeration. IMPRESSION: Slight increase in right-sided pleural effusion with increased adjacent right base airspace disease. Further improvement in left base aeration. Cardiomegaly with improved mild pulmonary venous congestion. Electronically Signed   By: Jeronimo GreavesKyle  Talbot M.D.   On: 04/23/2021 10:20   DG Chest Port 1 View  Result Date: 04/22/2021 CLINICAL DATA:  Hypoxia EXAM: PORTABLE CHEST 1 VIEW COMPARISON:  Yesterday FINDINGS: Mildly degraded exam due to AP portable technique and patient body habitus. Patient rotated to the right. Moderate cardiomegaly, accentuated by AP portable technique. Small right pleural effusion persists. No pneumothorax. Pulmonary interstitial thickening. improved left base aeration with persistent right lower lobe consolidation. IMPRESSION: Improved left base aeration. Persistent right base atelectasis or infection with adjacent small right pleural effusion. Cardiomegaly with interstitial thickening, for which pulmonary venous congestion is a concern. Electronically Signed   By: Jeronimo GreavesKyle   Talbot M.D.   On: 04/22/2021 10:46   DG Chest Port 1 View  Result Date: 04/21/2021 CLINICAL DATA:  Shortness of breath.  Respiratory distress. EXAM: PORTABLE CHEST 1 VIEW COMPARISON:  04/12/2021 FINDINGS: Again noted are bibasilar chest densities. Slightly increased densities at the left lung base. Heart appears to be enlarged but may be accentuated by the AP portable technique. Negative for pneumothorax. Degenerative changes at the left [facility] joint. IMPRESSION: Persistent bibasilar chest densities with slightly increased disease at the left lung base. Findings could represent atelectasis or infection. Electronically Signed   By: Richarda OverlieAdam  Henn M.D.   On: 04/21/2021 11:46     Assessment and Plan:    Paroxysmal atrial fibrillation with RVR - Longstanding history of paroxysmal A. Fib, historically controlled well with metoprolol, RVR likely due to withholding of beta-blocker and recurrent respiratory failure - Echo from 03/2021 EF preserved, no significant valvular disease - TSH WNL from 03/30/21 - May stop diltiazem infusion once rate controlled  - Metoprolol was held at admission, patient is now with RVR, may resume and give first dose now  - Keep K >4 and Mag > 2 - continue anticoagulation with Eliquis 5 mg twice daily -Consider infectious work-up rule out pneumonia with CT chest  Chronic diastolic heart failure -Chest x-ray yesterday revealed increase in right-sided pleural effusion, improved left basilar aeration, cardiomegaly with improved pulmonary venous congestion -BNP 154 on 04/21/21 (from 181 on 04/11/21) -No weight is recorded, Nett +  1485 ml since admission -Clinically difficult to assess volume status given morbid obesity, peripheral edema appears hypoalbuminemia and immobility related -Okay to continue Lasix bolus as needed for now   HTN - BP is controlled on current regimen with metoprolol 12.5 mg twice daily and diltiazem drip  HLD  -Atorvastatin 10 mg daily, no recent lipid profile,  may repeat outpatient    Acute metabolic encephalopathy Acute on chronic hypoxic hypercarbic respiratory failure Noncompliance with BiPAP OSA OHS Bipolar disorder with psychosis/hallucination/agitation Thrombocytopenia Type 2 diabetes GERD Obesity  -Managed per IM    Risk Assessment/Risk Scores:      New York Heart Association (NYHA) Functional Class NYHA Class II  CHA2DS2-VASc Score = 5  This indicates a 7.2% annual risk of stroke. The patient's score is based upon: CHF History: 1 HTN History: 1 Diabetes History: 1 Stroke History: 0 Vascular Disease History: 0 Age Score: 1 Gender Score: 1       For questions or updates, please contact [facility] Please consult [email or web address] for contact info under    Signed, Cyndi Bender, NP  04/24/2021 3:45 PM   Agree with note by Cyndi Bender NP  We are asked to see BeckyHendricks for atrial fibrillation with RVR and potential volume overload.  She has multiple medical problems including longstanding A. fib on Eliquis, chronic diastolic heart failure, hypertension, hyperlipidemia and obstructive sleep apnea noncompliant with BiPAP as well as frequent admissions for respiratory failure and metabolic encephalopathy.  She was somnolent on exam and minimally conversant.  She is a poor historian.  She is had multiple hospitalizations.  She does have A. fib with RVR but her beta-blocker was stopped on admission and she was put on a diltiazem drip.  I recommend stopping the diltiazem drip and starting her back on oral beta-blocker for rate control.  She does not seem to be volume overloaded on exam.  She has no peripheral edema.  She does have a right pleural effusion/mass infiltrate.  Infectious process cannot be ruled out.  I think the bigger quick question is goals of care.  I recommend a family meeting with the primary care team to talk about CODE STATUS.  She lives in his SNF.  A palliative care consult would be appropriate.  No further  work-up.  We will be available for any further questions regarding her A. fib.  I am not sure she is the best Eliquis candidate.  Runell Gess, M.D., FACP, [facility], [facility] [address] [place], [place]  [address]  [phone] 04/24/2021 5:01 PM

## 2021-04-24 NOTE — TOC Progression Note (Addendum)
Transition of Care [facility]) - Progression Note    Patient Details  Name: Becky Hendricks MRN: [id number] Date of Birth: 10-03-1945  [facility] CM/SW Contact  [name], RN Phone Number:[phone]  04/24/2021, 9:53 AM  Clinical Narrative:    CM called [name] to verify that patient came from this facility and that she would be able to return when medically stable. Message has been left for Star the admissions director.   1050 CM spoke with Star at [facility]. Patient is ok to return when medically stable. Star is requesting that patients wound be evaluated and measured per nursing staff. TOC will continue to follow.   Expected Discharge Plan: Long Term Acute Care (LTAC) Barriers to Discharge: Continued Medical Work up  Expected Discharge Plan and Services Expected Discharge Plan: Long Term Acute Care (LTAC)                                               Social Determinants of Health (SDOH) Interventions    Readmission Risk Interventions No flowsheet data found.

## 2021-04-24 NOTE — Progress Notes (Signed)
PROGRESS NOTE    Becky Hendricks  YQM:[id number] DOB: Nov 14, 1945 DOA: 04/21/2021 PCP: Roderic Scarce, MD  Brief Narrative:  The patient is a 75 year old obese [place] female with a past medical history significant for blood #2 chronic hypoxic hypercapnic respiratory failure secondary to OSA with noncompliant with BiPAP, obesity, insulin-dependent diabetes mellitus type 2, hypertension, proximal atrial fibrillation on Eliquis, bipolar disorder as well as other comorbidities who was sent from her facility for AMS.  She has been recently hospitalized 2 times in 1 month for similar condition of hypoxic hypercapnic respiratory failure all because she will refuse to wear her BiPAP as well as including 1 recent hospital stay where she was intubated and extubated and discharged 2 days ago.  The morning of admission she was found obtunded in her bed and was not wearing her BiPAP.  She presented ED and showed decompensated acute on chronic HFrEF Hypoxic respiratory failure with a pH of 7.29 and a PCO2 of 100.  After 2 hours of BiPAP she improved to 10.32 with a PCO2 of 95 and was more awake.  Chest x-ray showed similar bilateral lower lung field changes and she became agitated and is now hallucinating significantly confused so psychiatry has been consulted.  Psychiatry recommending Depakote, Risperdal and adding a 1 mg dose at 2:00 pm. Ammonia level and Depakote ordered and Ammonia level was elevated to 101 so will start Lactulose at 10 mg po BID. She remains in A Fib with RVR on a Dilt gtt so will consult Cardiology for further evaluation. Also remains volume overloaded so will give IV Lasix 40 mg again today.   Assessment & Plan:   Active Problems:   Acute on chronic respiratory failure with hypoxia and hypercapnia (HCC)   Acute and chronic respiratory failure with hypercapnia (HCC)   Respiratory failure (HCC)  Acute Metabolic Encephalopathy in the setting of acute on chronic hypercapnic hypoxic respiratory  failure respiratory acidosis along in the setting of OSA with noncompliance of BiPAP as well as acute on chronic diastolic CHF -She has a significant history of acute on chronic hypercapnic hypoxic respiratory failure and respiratory acidosis -She is familiar to [facility]  And recently was discharged 3 days ago.  Her brother lives in [place] and she has a very poor prognosis but he wants referral: None she was admitted to the PCU -She continues to be significantly agitated and hallucinating and very upset -She has a short-term and long-term poor prognosis given her repeated failure noncompliance with BiPAP and is at risk for sudden death    Component Value Date/Time   PHART 7.329 (L) 04/21/2021 1420   PCO2ART 95.2 (HH) 04/21/2021 1420   PO2ART 107 04/21/2021 1420   HCO3 43.1 (H) 04/22/2021 0120   TCO2 >50 (H) 04/21/2021 1420   O2SAT 74.6 04/22/2021 0120  -Continue supplemental oxygen via nasal cannula and BiPAP as tolerated and wean oxygen to baseline -Continuous pulse oximetry maintain O2 saturation greater 90% -SpO2: (P) 96 % O2 Flow Rate (L/min): 2 L/min FiO2 (%): 40 % -Likely her encephalopathy is in the setting of her hypercapnic issues and her chest x-ray showed chronic changes and she has received antibiotics on both admissions recently but will not initiate this time -Patient is allergic to acetazolamide; continue with spironolactone 12 5 mg p.o. every other day and was given a dose of IV Lasix 60 mg and given that she appeared volume overloaded and a dose of IV Lasix 40 mg; She is still is +85.1 mL since admission  -  C/w Duoneb q6hprn Wheezing and SOB -BNP was 154.5 -Continue to monitor respiratory status carefully and continue with BiPAP nightly; Will need BiPAP qHS -Chest x-ray in the a.m. and she will need an ambulatory home O2 screen prior to discharge  Bipolar disorder, hallucinations and significant agitation and paranoia as well as psychosis -Psychiatry has been consulted  for further evaluation recommendations but will need to rule out infection first and checking a urinalysis and urine culture as well as blood cultures x2 given her history of bacteremia -Continue with divalproex 750 mg p.o. twice daily as well as continuing risperidone 2 mg p.o. twice daily -Has Haldol lactate 5 mg IV every 6 as needed agitation -Further care per Psychiatry  Hyperammonemia -Ammonia Level was 101 and repeat was 23 -Started Lactulose 20 grams BID but decrease to 10 mg BID  -Psych Recommends continuing Lactulose 10 mg po BID  Acute on Chronic hypercapnic/hypoxic respiratory failure with respiratory acidosis Acute on chronic diastolic CHF -See as above -She has grade 2 diastolic dysfunction and elevated left atrial pressure -Strict I's and O's and daily weights; She is improving from a volume status  -We will give a dose of IV Lasix 60 mg and will repeat IV Lasix 40 mg x1 again today and defer further diuresis to Cardiology -Continue with BiPAP as needed and nightly -Repeat CXR on 04/23/21 showed "Slight increase in right-sided pleural effusion with increased adjacent right base airspace disease. Further improvement in left base aeration. Cardiomegaly with improved mild pulmonary venous congestion." -Continue to Monitor for S/Sx of Volume Overload   PAF with RVR; -Was having Rates in the 130's and sustaining  -C/w Telemetry Monitoring  -C/w Apixaban 5 mg po BID  -Remains on a Diltiazem gtt -Beta-blocker was held in setting of Bradycardia but will resume Metoprolol 12.5 mg po BID -Continue Telemetry Monitoring  -Cardiology consulted for further evaluation and recommendations -Recent ECHO done last month   Normocytic Anemia -Patient's Hgb/Hct went from 9.3/31.9 -> 10.2/30.0 -> 9.7/31.9 -> 10.5/33.6 -> 11.1/36.9 -C/w Ferrous Sulfate 324 mg po Daily  -Check Anemia Panel in the AM -Continue to Monitor for S/Sx of Bleeding  -Repeat CBC in the AM    Thrombocytopenia -Patient's Platelet Count has gone from 157 -> 137 -> 148 -> 132 -Continue to Monitor and Trend Repeat CMP int eh AM   Insulin-dependent diabetes Mellitus Type 2 -Continue with moderate NovoLog/scale insulin as well as linagliptin 5 mg p.o. daily as well as empagliflozin 10 mg p.o. daily -Continue monitor CBGs; CBG's ranging from 146-180  GERD/GI prophylaxis -Continue with Pantoprazole 40 once daily  Hyperlipidemia -C/w Atorvastatin 10 mg po qHS  Hypertension -Blood pressures been elevated -Given a dose of IV Lasix yesterday and will give another dose of IV 40 mg today  -Her beta-blocker is being held currently -Continue monitor blood pressures per protocol -We will add IV hydralazine -Last BP reading was 129/60  Obesity -Complicates overall prognosis and care -Estimated body mass index is 37.68 kg/m as calculated from the following:   Height as of 04/14/21: 5\' 6"  (1.676 m).   Weight as of 04/19/21: 105.9 kg. -Weight Loss and Dietary Counseling  -Nutritionist consulted and recommending Ensure Max po BID and MVI+ Minerals Daily    DVT prophylaxis: Anticoagulated with Apixaban Code Status: FULL CODE Family Communication: No family present at bedside  Disposition Plan: Pending further clinical improvement   Status is: Inpatient  Remains inpatient appropriate because:Unsafe d/c plan, IV treatments appropriate due to intensity of illness or  inability to take PO, and Inpatient level of care appropriate due to severity of illness  Dispo: The patient is from: SNF              Anticipated d/c is to: SNF              Patient currently is not medically stable to d/c.   Difficult to place patient No  Consultants:  Psychiatry   Procedures: None  Antimicrobials:  Anti-infectives (From admission, onward)    None        Subjective: Seen and examined at bedside and she was calmer today and asking for assistance to eat.  Thinks that her leg swelling is  improved.  No nausea or vomiting.  States that she was "cold and hungry."  Heart rates remain uncontrolled so cardiology was consulted and her metoprolol p.o. was restarted.  No other concerns or complaints at this time.  Objective: Vitals:   04/23/21 2355 04/24/21 0400 04/24/21 0745 04/24/21 1155  BP:   129/60 (!) (P) 101/58  Pulse:   (!) 124 (P) 93  Resp:   20 (P) 20  Temp: 99.1 F (37.3 C) 98.6 F (37 C) 98.7 F (37.1 C) (P) 98.5 F (36.9 C)  TempSrc: Axillary Axillary Axillary (P) Axillary  SpO2:   93% (P) 96%    Intake/Output Summary (Last 24 hours) at 04/24/2021 1447 Last data filed at 04/24/2021 1400 Gross per 24 hour  Intake 271.97 ml  Output 1800 ml  Net -1528.03 ml    There were no vitals filed for this visit.  Examination: Physical Exam:  Constitutional: WN/WD obese female in no acute distress appears calmer today and is not as anxious or tearful Eyes: Lids and conjunctivae normal, sclerae anicteric  ENMT: External Ears, Nose appear normal. Grossly normal hearing. Mucous membranes are moist.  Neck: Appears normal, supple, no cervical masses, normal ROM, no appreciable thyromegaly; mild JVD Respiratory: Diminished to auscultation bilaterally with coarse breath sounds and has some crackles, no wheezing, rales, rhonchi. Normal respiratory effort and patient is not tachypenic. No accessory muscle use.  Wearing supplemental oxygen via nasal cannula Cardiovascular: Irregularly irregular tachycardic, no murmurs / rubs / gallops. S1 and S2 auscultated.  1+ lower extremity edema Abdomen: Soft, non-tender, distended secondary to body habitus. Bowel sounds positive x4.  GU: Deferred. Musculoskeletal: No clubbing / cyanosis of digits/nails. No joint deformity upper and lower extremities. Skin: No rashes, lesions, ulcers on a limited skin evaluation. No induration; Warm and dry.  Neurologic: CN 2-12 grossly intact with no focal deficits. Romberg sign and cerebellar reflexes not  assessed.  Psychiatric: Normal judgment and insight. Alert and awake. Normal mood and appropriate affect.   Data Reviewed: I have personally reviewed following labs and imaging studies  CBC: Recent Labs  Lab 04/21/21 0944 04/21/21 0956 04/21/21 1420 04/22/21 0912 04/23/21 0336 04/24/21 1051  WBC 6.6  --   --  6.6 7.1 8.7  NEUTROABS  --   --   --  3.6 3.5 6.1  HGB 9.9* 10.2* 10.2* 9.7* 10.5* 11.1*  HCT 34.1* 30.0* 30.0* 31.9* 33.6* 36.9  MCV 97.2  --   --  92.5 91.6 94.1  PLT 157  --   --  137* 148* 132*    Basic Metabolic Panel: Recent Labs  Lab 04/21/21 0956 04/21/21 1420 04/22/21 0912 04/23/21 0336 04/24/21 1051  NA 140 141 138 139 142  K 4.4 4.1 3.8 3.6 3.6  CL  --   --  93*  92* 94*  CO2  --   --  38* 36* 38*  GLUCOSE  --   --  132* 164* 165*  BUN  --   --  CREATININE  --   --  0.47 0.74 0.74  CALCIUM  --   --  8.3* 8.6* 8.6*  MG  --   --  1.9 1.9 2.1  PHOS  --   --  2.5 3.5 4.5    GFR: Estimated Creatinine Clearance: 77 mL/min (by C-G formula based on SCr of 0.74 mg/dL). Liver Function Tests: Recent Labs  Lab 04/22/21 0912 04/23/21 0336 04/24/21 1051  AST 8* 8* 9*  ALT ALKPHOS 33* 33* 30*  BILITOT 0.8 0.3 1.1  PROT 5.0* 5.3* 5.4*  ALBUMIN 2.2* 2.3* 2.3*    No results for input(s): LIPASE, AMYLASE in the last 168 hours. Recent Labs  Lab 04/22/21 1658 04/23/21 0851  AMMONIA 101* 23    Coagulation Profile: No results for input(s): INR, PROTIME in the last 168 hours. Cardiac Enzymes: No results for input(s): CKTOTAL, CKMB, CKMBINDEX, TROPONINI in the last 168 hours. BNP (last 3 results) No results for input(s): PROBNP in the last 8760 hours. HbA1C: No results for input(s): HGBA1C in the last 72 hours. CBG: Recent Labs  Lab 04/23/21 1226 04/23/21 1632 04/23/21 2029 04/24/21 0745 04/24/21 1157  GLUCAP 132* 174* 163* 180* 146*   Lipid Profile: No results for input(s): CHOL, HDL, LDLCALC, TRIG, CHOLHDL, LDLDIRECT in the  last 72 hours. Thyroid Function Tests: No results for input(s): TSH, T4TOTAL, FREET4, T3FREE, THYROIDAB in the last 72 hours. Anemia Panel: No results for input(s): VITAMINB12, FOLATE, FERRITIN, TIBC, IRON, RETICCTPCT in the last 72 hours. Sepsis Labs: No results for input(s): PROCALCITON, LATICACIDVEN in the last 168 hours.  Recent Results (from the past 240 hour(s))  Resp Panel by RT-PCR (Flu A&B, Covid) Nasopharyngeal Swab     Status: None   Collection Time: 04/18/21 12:18 PM   Specimen: Nasopharyngeal Swab; Nasopharyngeal(NP) swabs in vial transport medium  Result Value Ref Range Status   SARS Coronavirus 2 by RT PCR NEGATIVE NEGATIVE Final    Comment: (NOTE) SARS-CoV-2 target nucleic acids are NOT DETECTED.  The SARS-CoV-2 RNA is generally detectable in upper respiratory specimens during the acute phase of infection. The lowest concentration of SARS-CoV-2 viral copies this assay can detect is 138 copies/mL. A negative result does not preclude SARS-Cov-2 infection and should not be used as the sole basis for treatment or other patient management decisions. A negative result may occur with  improper specimen collection/handling, submission of specimen other than nasopharyngeal swab, presence of viral mutation(s) within the areas targeted by this assay, and inadequate number of viral copies(<138 copies/mL). A negative result must be combined with clinical observations, patient history, and epidemiological information. The expected result is Negative.  Fact Sheet for Patients:  BloggerCourse.com  Fact Sheet for Healthcare Providers:  SeriousBroker.it  This test is no t yet approved or cleared by the [place] FDA and  has been authorized for detection and/or diagnosis of SARS-CoV-2 by FDA under an Emergency Use Authorization (EUA). This EUA will remain  in effect (meaning this test can be used) for the duration of  the COVID-19 declaration under Section 564(b)(1) of the Act, 21 U.S.C.section 360bbb-3(b)(1), unless the authorization is terminated  or revoked sooner.       Influenza A by PCR NEGATIVE NEGATIVE Final   Influenza B by PCR NEGATIVE NEGATIVE  Final    Comment: (NOTE) The Xpert Xpress SARS-CoV-2/FLU/RSV plus assay is intended as an aid in the diagnosis of influenza from Nasopharyngeal swab specimens and should not be used as a sole basis for treatment. Nasal washings and aspirates are unacceptable for Xpert Xpress SARS-CoV-2/FLU/RSV testing.  Fact Sheet for Patients: BloggerCourse.com  Fact Sheet for Healthcare Providers: SeriousBroker.it  This test is not yet approved or cleared by the [place] FDA and has been authorized for detection and/or diagnosis of SARS-CoV-2 by FDA under an Emergency Use Authorization (EUA). This EUA will remain in effect (meaning this test can be used) for the duration of the COVID-19 declaration under Section 564(b)(1) of the Act, 21 U.S.C. section 360bbb-3(b)(1), unless the authorization is terminated or revoked.  Performed at [facility], [address] [place], [place] [address]   Resp Panel by RT-PCR (Flu A&B, Covid) Nasopharyngeal Swab     Status: None   Collection Time: 04/21/21 10:18 AM   Specimen: Nasopharyngeal Swab; Nasopharyngeal(NP) swabs in vial transport medium  Result Value Ref Range Status   SARS Coronavirus 2 by RT PCR NEGATIVE NEGATIVE Final    Comment: (NOTE) SARS-CoV-2 target nucleic acids are NOT DETECTED.  The SARS-CoV-2 RNA is generally detectable in upper respiratory specimens during the acute phase of infection. The lowest concentration of SARS-CoV-2 viral copies this assay can detect is 138 copies/mL. A negative result does not preclude SARS-Cov-2 infection and should not be used as the sole basis for treatment or other patient management decisions. A negative  result may occur with  improper specimen collection/handling, submission of specimen other than nasopharyngeal swab, presence of viral mutation(s) within the areas targeted by this assay, and inadequate number of viral copies(<138 copies/mL). A negative result must be combined with clinical observations, patient history, and epidemiological information. The expected result is Negative.  Fact Sheet for Patients:  BloggerCourse.com  Fact Sheet for Healthcare Providers:  SeriousBroker.it  This test is no t yet approved or cleared by the [place] FDA and  has been authorized for detection and/or diagnosis of SARS-CoV-2 by FDA under an Emergency Use Authorization (EUA). This EUA will remain  in effect (meaning this test can be used) for the duration of the COVID-19 declaration under Section 564(b)(1) of the Act, 21 U.S.C.section 360bbb-3(b)(1), unless the authorization is terminated  or revoked sooner.       Influenza A by PCR NEGATIVE NEGATIVE Final   Influenza B by PCR NEGATIVE NEGATIVE Final    Comment: (NOTE) The Xpert Xpress SARS-CoV-2/FLU/RSV plus assay is intended as an aid in the diagnosis of influenza from Nasopharyngeal swab specimens and should not be used as a sole basis for treatment. Nasal washings and aspirates are unacceptable for Xpert Xpress SARS-CoV-2/FLU/RSV testing.  Fact Sheet for Patients: BloggerCourse.com  Fact Sheet for Healthcare Providers: SeriousBroker.it  This test is not yet approved or cleared by the [place] FDA and has been authorized for detection and/or diagnosis of SARS-CoV-2 by FDA under an Emergency Use Authorization (EUA). This EUA will remain in effect (meaning this test can be used) for the duration of the COVID-19 declaration under Section 564(b)(1) of the Act, 21 U.S.C. section 360bbb-3(b)(1), unless the authorization is  terminated or revoked.  Performed at [facility], [address] [place], [place] [address]   Culture, blood (routine x 2)     Status: None (Preliminary result)   Collection Time: 04/22/21  4:58 PM   Specimen: BLOOD RIGHT HAND  Result  Value Ref Range Status   Specimen Description BLOOD RIGHT HAND  Final   Special Requests   Final    BOTTLES DRAWN AEROBIC ONLY Blood Culture adequate volume   Culture   Final    NO GROWTH 2 DAYS Performed at [facility] [place] [facility], [address] [place], [place] [address]    Report Status PENDING  Incomplete  Culture, blood (routine x 2)     Status: None (Preliminary result)   Collection Time: 04/22/21  5:07 PM   Specimen: BLOOD  Result Value Ref Range Status   Specimen Description BLOOD LEFT ANTECUBITAL  Final   Special Requests   Final    BOTTLES DRAWN AEROBIC ONLY Blood Culture adequate volume   Culture   Final    NO GROWTH 2 DAYS Performed at [facility], [address], [place], [place] [address]    Report Status PENDING  Incomplete     RN Pressure Injury Documentation: Pressure Injury 04/21/21 Sacrum Medial Unstageable - Full thickness tissue loss in which the base of the injury is covered by slough (yellow, tan, gray, green or brown) and/or eschar (tan, brown or black) in the wound bed. yellow, pink, craterous (Active)  04/21/21 1720  Location: Sacrum  Location Orientation: Medial  Staging: Unstageable - Full thickness tissue loss in which the base of the injury is covered by slough (yellow, tan, gray, green or brown) and/or eschar (tan, brown or black) in the wound bed.  Wound Description (Comments): yellow, pink, craterous  Present on Admission: Yes     Estimated body mass index is 37.68 kg/m as calculated from the following:   Height as of 04/14/21:  (1.676 m).   Weight as of 04/19/21: 105.9 kg.  Malnutrition Type:  Nutrition Problem: Increased nutrient needs Etiology: wound healing  Malnutrition  Characteristics:  Signs/Symptoms: estimated needs  Nutrition Interventions:  Interventions: MVI, Ensure Enlive (each supplement provides 350kcal and 20 grams of protein)   Radiology Studies: DG CHEST PORT 1 VIEW  Result Date: 04/23/2021 CLINICAL DATA:  Evaluate atelectasis and right pleural effusion EXAM: PORTABLE CHEST 1 VIEW COMPARISON:  04/22/2021 FINDINGS: Midline trachea. Cardiomegaly accentuated by AP portable technique. Increase in small to moderate right pleural effusion. No pneumothorax. Pulmonary interstitial prominence persists. Persistent right base airspace disease. Improved left-sided aeration. IMPRESSION: Slight increase in right-sided pleural effusion with increased adjacent right base airspace disease. Further improvement in left base aeration. Cardiomegaly with improved mild pulmonary venous congestion. Electronically Signed   By: Jeronimo Greaves M.D.   On: 04/23/2021 10:20    Scheduled Meds:  apixaban  5 mg Oral BID   atorvastatin  10 mg Oral QHS   divalproex  750 mg Oral BID   empagliflozin  10 mg Oral Q breakfast   ferrous sulfate  324 mg Oral Daily   furosemide  40 mg Intravenous Once   insulin aspart  0-15 Units Subcutaneous TID WC   lactulose  10 g Oral BID   linagliptin  5 mg Oral Daily   multivitamin with minerals  1 tablet Oral Daily   pantoprazole  40 mg Oral Daily   polyethylene glycol  17 g Oral Daily   Ensure Max Protein  11 oz Oral BID   risperiDONE  1 mg Oral Q1400   risperiDONE  2 mg Oral BID   senna-docusate  2 tablet Oral Daily   spironolactone  25 mg Oral QODAY   tamsulosin  0.4 mg Oral QODAY   Continuous Infusions:  diltiazem (CARDIZEM) infusion 10 mg/hr (04/24/21 0249)    LOS: 3 days   Merlene Laughtermair Latif Jihan Mellette, DO [facility] PAGER is on AMION  If 7PM-7AM, please contact night-coverage [email or web address]

## 2021-04-24 NOTE — Consult Note (Signed)
Reason for Consult:Severe Hallucinations, Paranoia, Bipolar Disorder and labile mood Referring Physician: Merlene Laughter, DO  Becky Hendricks is an 75 y.o. female.  HPI:  Becky Hendricks is a 75 yr old female with a history of chronic hypoxic hypercapnic respiratory failure secondary to OSA, noncompliant with BiPAP at night, morbid obesity, IDDM, HTN, PAF on Eliquis, bipolar disorder, sent from nursing home for AMS. Recently discharged from the hospital on 8/26 and 9/7 after admissions stemming from acute hypoxic and hypercarbic respiratory failure due to her refusal to wear a BiPAP at night.    She reports that she is doing okay today.  She reports no SI, HI, or AVH.  Discussed with her my concern that she has been hospitalized again in such a short timeframe due to her breathing issues.  Inquired again why she will not wear her BiPAP.  She reports that it is uncomfortable attempted to inquire if hospitalization was more uncomfortable than the BiPAP at this point however she reported that she has made herself clear and will not speak of it any further.  At this point she refused to continue the interview.  Told her that I would return again tomorrow and pick up from there.   Past Medical History:  Diagnosis Date   Atrial fibrillation (HCC)    Bipolar disorder (HCC)    Bipolar disorder (HCC) 04/27/2020   Clostridium difficile diarrhea 08/02/2019   Dehydration    Essential hypertension    Fall 02/11/2020   Morbid obesity (HCC)    Osteomyelitis (HCC) 12/03/2019   Pneumonia due to COVID-19 virus 09/15/2019   Type 2 diabetes mellitus (HCC)    Weakness     Past Surgical History:  Procedure Laterality Date   INCISION AND DRAINAGE PERIRECTAL ABSCESS Left 12/04/2019   Procedure: IRRIGATION AND DEBRIDEMENT LEFT BUTTOCK ABSCESS;  Surgeon: Violeta Gelinas, MD;  Location: [facility] OR;  Service: General;  Laterality: Left;   INCISION AND DRAINAGE PERIRECTAL ABSCESS Left 12/10/2019   Procedure: REPEAT  IRRIGATION AND DEBRIDEMENT BUTTOCK  ABSCESS;  Surgeon: Abigail Miyamoto, MD;  Location: [facility] OR;  Service: General;  Laterality: Left;   IR FLUORO GUIDE CV LINE RIGHT  11/25/2020   IR RADIOLOGIST EVAL & MGMT  12/29/2020   IR US GUIDE VASC ACCESS RIGHT  11/25/2020    Family History  Family history unknown: Yes    Social History:  reports that she has never smoked. She has never used smokeless tobacco. She reports that she does not currently use alcohol. She reports that she does not currently use drugs.  Allergies:  Allergies  Allergen Reactions   Chlorhexidine Gluconate Itching   Vancomycin     Vancomycin infusion related reaction- May need Vanc to be given at a slower rate    Acetazolamide Er Rash    Medications: I have reviewed the patient's current medications.  Results for orders placed or performed during the hospital encounter of 04/21/21 (from the past 48 hour(s))  Glucose, capillary     Status: Abnormal   Collection Time: 04/22/21  8:04 AM  Result Value Ref Range   Glucose-Capillary 134 (H) 70 - 99 mg/dL    Comment: Glucose reference range applies only to samples taken after fasting for at least 8 hours.  CBC with Differential/Platelet     Status: Abnormal   Collection Time: 04/22/21  9:12 AM  Result Value Ref Range   WBC 6.6 4.0 - 10.5 K/uL   RBC 3.45 (L) 3.87 - 5.11 MIL/uL   Hemoglobin 9.7 (  L) 12.0 - 15.0 g/dL   HCT 16.131.9 (L) 09.636.0 - 04.546.0 %   MCV 92.5 80.0 - 100.0 fL   MCH 28.1 26.0 - 34.0 pg   MCHC 30.4 30.0 - 36.0 g/dL   RDW 40.914.4 81.111.5 - 91.415.5 %   Platelets 137 (L) 150 - 400 K/uL    Comment: REPEATED TO VERIFY   nRBC 0.0 0.0 - 0.2 %   Neutrophils Relative % 55 %   Neutro Abs 3.6 1.7 - 7.7 K/uL   Lymphocytes Relative 29 %   Lymphs Abs 1.9 0.7 - 4.0 K/uL   Monocytes Relative 12 %   Monocytes Absolute 0.8 0.1 - 1.0 K/uL   Eosinophils Relative 3 %   Eosinophils Absolute 0.2 0.0 - 0.5 K/uL   Basophils Relative 0 %   Basophils Absolute 0.0 0.0 - 0.1 K/uL   Immature  Granulocytes 1 %   Abs Immature Granulocytes 0.09 (H) 0.00 - 0.07 K/uL    Comment: Performed at [facility], [address] [place], [place] [address]  Comprehensive metabolic panel     Status: Abnormal   Collection Time: 04/22/21  9:12 AM  Result Value Ref Range   Sodium 138 135 - 145 mmol/L   Potassium 3.8 3.5 - 5.1 mmol/L   Chloride 93 (L) 98 - 111 mmol/L   CO2 38 (H) 22 - 32 mmol/L   Glucose, Bld 132 (H) 70 - 99 mg/dL    Comment: Glucose reference range applies only to samples taken after fasting for at least 8 hours.   BUN 14 8 - 23 mg/dL   Creatinine, Ser 6.210.47 0.44 - 1.00 mg/dL   Calcium 8.3 (L) 8.9 - 10.3 mg/dL   Total Protein 5.0 (L) 6.5 - 8.1 g/dL   Albumin 2.2 (L) 3.5 - 5.0 g/dL   AST 8 (L) 15 - 41 U/L   ALT 7 0 - 44 U/L   Alkaline Phosphatase 33 (L) 38 - 126 U/L   Total Bilirubin 0.8 0.3 - 1.2 mg/dL   GFR, Estimated >30>60 >86>60 mL/min    Comment: (NOTE) Calculated using the CKD-EPI Creatinine Equation (2021)    Anion gap 7 5 - 15    Comment: Performed at [facility], [address] [place], [place] [address]  Magnesium     Status: None   Collection Time: 04/22/21  9:12 AM  Result Value Ref Range   Magnesium 1.9 1.7 - 2.4 mg/dL    Comment: Performed at [facility], [address] [place], [place] [address]  Phosphorus     Status: None   Collection Time: 04/22/21  9:12 AM  Result Value Ref Range   Phosphorus 2.5 2.5 - 4.6 mg/dL    Comment: Performed at [facility], [address] [place], [place] [address]  Glucose, capillary     Status: Abnormal   Collection Time: 04/22/21 11:14 AM  Result Value Ref Range   Glucose-Capillary 127 (H) 70 - 99 mg/dL    Comment: Glucose reference range applies only to samples taken after fasting for at least 8 hours.  Glucose, capillary     Status: Abnormal   Collection Time: 04/22/21  4:24 PM  Result Value Ref Range   Glucose-Capillary 217 (H) 70 - 99 mg/dL    Comment: Glucose reference range applies  only to samples taken after fasting for at least 8 hours.  Culture, blood (routine x 2)     Status: None (Preliminary result)  Collection Time: 04/22/21  4:58 PM   Specimen: BLOOD RIGHT HAND  Result Value Ref Range   Specimen Description BLOOD RIGHT HAND    Special Requests      BOTTLES DRAWN AEROBIC ONLY Blood Culture adequate volume   Culture      NO GROWTH < 24 HOURS Performed at [facility], [address], [place], [place] [address]    Report Status PENDING   Ammonia     Status: Abnormal   Collection Time: 04/22/21  4:58 PM  Result Value Ref Range   Ammonia 101 (H) 9 - 35 umol/L    Comment: Performed at [facility], [address], [place], [place] [address]  Valproic acid level     Status: None   Collection Time: 04/22/21  4:58 PM  Result Value Ref Range   Valproic Acid Lvl 74 50.0 - 100.0 ug/mL    Comment: Performed at [facility], [address], [place], [place] [address]  Culture, blood (routine x 2)     Status: None (Preliminary result)   Collection Time: 04/22/21  5:07 PM   Specimen: BLOOD  Result Value Ref Range   Specimen Description BLOOD LEFT ANTECUBITAL    Special Requests      BOTTLES DRAWN AEROBIC ONLY Blood Culture adequate volume   Culture      NO GROWTH < 24 HOURS Performed at [facility], [address], [place], [place] [address]    Report Status PENDING   Glucose, capillary     Status: Abnormal   Collection Time: 04/22/21  9:15 PM  Result Value Ref Range   Glucose-Capillary 182 (H) 70 - 99 mg/dL    Comment: Glucose reference range applies only to samples taken after fasting for at least 8 hours.  Glucose, capillary     Status: Abnormal   Collection Time: 04/23/21 12:33 AM  Result Value Ref Range   Glucose-Capillary 149 (H) 70 - 99 mg/dL    Comment: Glucose reference range applies only to samples taken after fasting for at least 8 hours.  CBC with Differential/Platelet     Status: Abnormal   Collection Time: 04/23/21   3:36 AM  Result Value Ref Range   WBC 7.1 4.0 - 10.5 K/uL   RBC 3.67 (L) 3.87 - 5.11 MIL/uL   Hemoglobin 10.5 (L) 12.0 - 15.0 g/dL   HCT 29.5 (L) 62.1 - 30.8 %   MCV 91.6 80.0 - 100.0 fL   MCH 28.6 26.0 - 34.0 pg   MCHC 31.3 30.0 - 36.0 g/dL   RDW 65.7 84.6 - 96.2 %   Platelets 148 (L) 150 - 400 K/uL   nRBC 0.0 0.0 - 0.2 %   Neutrophils Relative % 50 %   Neutro Abs 3.5 1.7 - 7.7 K/uL   Lymphocytes Relative 32 %   Lymphs Abs 2.3 0.7 - 4.0 K/uL   Monocytes Relative 13 %   Monocytes Absolute 0.9 0.1 - 1.0 K/uL   Eosinophils Relative 4 %   Eosinophils Absolute 0.3 0.0 - 0.5 K/uL   Basophils Relative 0 %   Basophils Absolute 0.0 0.0 - 0.1 K/uL   Immature Granulocytes 1 %   Abs Immature Granulocytes 0.08 (H) 0.00 - 0.07 K/uL    Comment: Performed at [facility], [address], [place], [place] [address]  Comprehensive metabolic panel     Status: Abnormal   Collection Time: 04/23/21  3:36 AM  Result Value Ref  Range   Sodium 139 135 - 145 mmol/L   Potassium 3.6 3.5 - 5.1 mmol/L   Chloride 92 (L) 98 - 111 mmol/L   CO2 36 (H) 22 - 32 mmol/L   Glucose, Bld 164 (H) 70 - 99 mg/dL    Comment: Glucose reference range applies only to samples taken after fasting for at least 8 hours.   BUN 16 8 - 23 mg/dL   Creatinine, Ser 7.61 0.44 - 1.00 mg/dL   Calcium 8.6 (L) 8.9 - 10.3 mg/dL   Total Protein 5.3 (L) 6.5 - 8.1 g/dL   Albumin 2.3 (L) 3.5 - 5.0 g/dL   AST 8 (L) 15 - 41 U/L   ALT 7 0 - 44 U/L   Alkaline Phosphatase 33 (L) 38 - 126 U/L   Total Bilirubin 0.3 0.3 - 1.2 mg/dL   GFR, Estimated >60 >73 mL/min    Comment: (NOTE) Calculated using the CKD-EPI Creatinine Equation (2021)    Anion gap 11 5 - 15    Comment: Performed at [facility], [address], [place], [place] [address]  Magnesium     Status: None   Collection Time: 04/23/21  3:36 AM  Result Value Ref Range   Magnesium 1.9 1.7 - 2.4 mg/dL    Comment: Performed at [facility], [address]  [place], [place] [address]  Phosphorus     Status: None   Collection Time: 04/23/21  3:36 AM  Result Value Ref Range   Phosphorus 3.5 2.5 - 4.6 mg/dL    Comment: Performed at [facility], [address] [place], [place] [address]  Glucose, capillary     Status: Abnormal   Collection Time: 04/23/21  8:24 AM  Result Value Ref Range   Glucose-Capillary 150 (H) 70 - 99 mg/dL    Comment: Glucose reference range applies only to samples taken after fasting for at least 8 hours.  Ammonia     Status: None   Collection Time: 04/23/21  8:51 AM  Result Value Ref Range   Ammonia 23 9 - 35 umol/L    Comment: Performed at [facility], [address], [place], [place] [address]  Glucose, capillary     Status: Abnormal   Collection Time: 04/23/21 12:26 PM  Result Value Ref Range   Glucose-Capillary 132 (H) 70 - 99 mg/dL    Comment: Glucose reference range applies only to samples taken after fasting for at least 8 hours.  Glucose, capillary     Status: Abnormal   Collection Time: 04/23/21  4:32 PM  Result Value Ref Range   Glucose-Capillary 174 (H) 70 - 99 mg/dL    Comment: Glucose reference range applies only to samples taken after fasting for at least 8 hours.  Glucose, capillary     Status: Abnormal   Collection Time: 04/23/21  8:29 PM  Result Value Ref Range   Glucose-Capillary 163 (H) 70 - 99 mg/dL    Comment: Glucose reference range applies only to samples taken after fasting for at least 8 hours.    DG CHEST PORT 1 VIEW  Result Date: 04/23/2021 CLINICAL DATA:  Evaluate atelectasis and right pleural effusion EXAM: PORTABLE CHEST 1 VIEW COMPARISON:  04/22/2021 FINDINGS: Midline trachea. Cardiomegaly accentuated by AP portable technique. Increase in small to moderate right pleural effusion. No pneumothorax. Pulmonary interstitial prominence persists. Persistent right base airspace disease. Improved left-sided aeration. IMPRESSION: Slight increase in right-sided pleural effusion with  increased adjacent right  base airspace disease. Further improvement in left base aeration. Cardiomegaly with improved mild pulmonary venous congestion. Electronically Signed   By: Jeronimo Greaves M.D.   On: 04/23/2021 10:20   DG Chest Port 1 View  Result Date: 04/22/2021 CLINICAL DATA:  Hypoxia EXAM: PORTABLE CHEST 1 VIEW COMPARISON:  Yesterday FINDINGS: Mildly degraded exam due to AP portable technique and patient body habitus. Patient rotated to the right. Moderate cardiomegaly, accentuated by AP portable technique. Small right pleural effusion persists. No pneumothorax. Pulmonary interstitial thickening. improved left base aeration with persistent right lower lobe consolidation. IMPRESSION: Improved left base aeration. Persistent right base atelectasis or infection with adjacent small right pleural effusion. Cardiomegaly with interstitial thickening, for which pulmonary venous congestion is a concern. Electronically Signed   By: Jeronimo Greaves M.D.   On: 04/22/2021 10:46    Review of Systems  Unable to perform ROS: Acuity of condition  Blood pressure 115/61, pulse (!) 126, temperature 98.6 F (37 C), temperature source Axillary, resp. rate 20, SpO2 92 %. Physical Exam Vitals and nursing note reviewed.  Constitutional:      General: She is not in acute distress.    Appearance: She is well-developed. She is obese. She is ill-appearing. She is not toxic-appearing.  HENT:     Head: Normocephalic and atraumatic.  Musculoskeletal:        General: Normal range of motion.  Neurological:     General: No focal deficit present.    04/24/21 1230  Presentation  General Appearance Disheveled;Appropriate for Environment;Casual  Eye Contact Fair  Speech Clear and Coherent;Slow  Speech Volume Normal  Mood and Affect  Mood Dysphoric;Irritable  Affect Depressed  Thought Processes  Thought Process Linear;Coherent  Descriptions of Associations Intact  Orientation Partial  Thought Content WDL   Hallucinations None  Ideas of Reference None  Suicidal Thoughts No  Homicidal Thoughts No  Sensorium  Memory Immediate Fair;Recent Fair  Judgment Impaired  Insight Lacking  Executive Functions  Concentration Fair  Attention Span Fair  Recall [employer] of Knowledge Fair  Language Fair  Psychomotor Activity  Psychomotor Activity Normal  Assets  Assets Communication Skills;Resilience  Sleep  Sleep Fair    Assessment/Plan: Case and Plan discussed with Dr. Lucianne Muss  Becky Hendricks is a 75 yr old female with a history of chronic hypoxic hypercapnic respiratory failure secondary to OSA, noncompliant with BiPAP at night, morbid obesity, IDDM, HTN, PAF on Eliquis, bipolar disorder, sent from nursing home for AMS. Recently discharged from the hospital on 8/26 and 9/7 after admissions stemming from acute hypoxic and hypercarbic respiratory failure due to her refusal to wear a BiPAP at night.    Patient continues to refuse to wear BiPAP.  Attempted to ascertain her reservations about it, however, she stated she will not wear it and that is the end of it.  It seems likely that this cycle will continue and she will continue to be readmitted for respiratory failure.  From psychiatric standpoint it is difficult to determine if her daytime drowsiness is due to medications versus hypercapnia.  At this point we will not make any medication changes and continue to monitor.  We will continue to encourage her to wear BiPAP when possible.    Bipolar Disorder: -Continue Depakote 750 mg BID -Continue Risperdal M-tabs 2 mg BID and 1 mg at 2 PM   Hyperammoniemia: -Continue Lactulose 10 g BID   Continue rest of care per Primary Team Psychiatry to continue to follow    Disposition:  No evidence of imminent risk to self or others at present.     Lauro Franklin 04/24/2021, 7:07 AM

## 2021-04-25 ENCOUNTER — Inpatient Hospital Stay (HOSPITAL_COMMUNITY): Payer: Medicare Other

## 2021-04-25 DIAGNOSIS — J9621 Acute and chronic respiratory failure with hypoxia: Secondary | ICD-10-CM | POA: Diagnosis not present

## 2021-04-25 DIAGNOSIS — J9622 Acute and chronic respiratory failure with hypercapnia: Secondary | ICD-10-CM | POA: Diagnosis not present

## 2021-04-25 DIAGNOSIS — I4891 Unspecified atrial fibrillation: Secondary | ICD-10-CM | POA: Diagnosis not present

## 2021-04-25 DIAGNOSIS — I959 Hypotension, unspecified: Secondary | ICD-10-CM

## 2021-04-25 LAB — POCT I-STAT 7, (LYTES, BLD GAS, ICA,H+H)
Acid-Base Excess: 19 mmol/L — ABNORMAL HIGH (ref 0.0–2.0)
Bicarbonate: 46.8 mmol/L — ABNORMAL HIGH (ref 20.0–28.0)
Calcium, Ion: 1.17 mmol/L (ref 1.15–1.40)
HCT: 32 % — ABNORMAL LOW (ref 36.0–46.0)
Hemoglobin: 10.9 g/dL — ABNORMAL LOW (ref 12.0–15.0)
O2 Saturation: 95 %
Patient temperature: 98.7
Potassium: 3.6 mmol/L (ref 3.5–5.1)
Sodium: 141 mmol/L (ref 135–145)
TCO2: 49 mmol/L — ABNORMAL HIGH (ref 22–32)
pCO2 arterial: 74.7 mmHg (ref 32.0–48.0)
pH, Arterial: 7.406 (ref 7.350–7.450)
pO2, Arterial: 80 mmHg — ABNORMAL LOW (ref 83.0–108.0)

## 2021-04-25 LAB — COMPREHENSIVE METABOLIC PANEL
ALT: 8 U/L (ref 0–44)
AST: 8 U/L — ABNORMAL LOW (ref 15–41)
Albumin: 2.2 g/dL — ABNORMAL LOW (ref 3.5–5.0)
Alkaline Phosphatase: 28 U/L — ABNORMAL LOW (ref 38–126)
Anion gap: 8 (ref 5–15)
BUN: 20 mg/dL (ref 8–23)
CO2: 42 mmol/L — ABNORMAL HIGH (ref 22–32)
Calcium: 8.6 mg/dL — ABNORMAL LOW (ref 8.9–10.3)
Chloride: 90 mmol/L — ABNORMAL LOW (ref 98–111)
Creatinine, Ser: 0.64 mg/dL (ref 0.44–1.00)
GFR, Estimated: >60 mL/min (ref >60)
Glucose, Bld: 119 mg/dL — ABNORMAL HIGH (ref 70–99)
Potassium: 3.6 mmol/L (ref 3.5–5.1)
Sodium: 140 mmol/L (ref 135–145)
Total Bilirubin: 0.9 mg/dL (ref 0.3–1.2)
Total Protein: 5 g/dL — ABNORMAL LOW (ref 6.5–8.1)

## 2021-04-25 LAB — BLOOD GAS, VENOUS
Acid-Base Excess: 17.1 mmol/L — ABNORMAL HIGH (ref 0.0–2.0)
Bicarbonate: 43.4 mmol/L — ABNORMAL HIGH (ref 20.0–28.0)
Drawn by: 33074
FIO2: 40
O2 Saturation: 85 %
Patient temperature: 37
pCO2, Ven: 77.2 mmHg (ref 44.0–60.0)
pH, Ven: 7.368 (ref 7.250–7.430)
pO2, Ven: 53 mmHg — ABNORMAL HIGH (ref 32.0–45.0)

## 2021-04-25 LAB — CBC WITH DIFFERENTIAL/PLATELET
Abs Immature Granulocytes: 0.06 10*3/uL (ref 0.00–0.07)
Basophils Absolute: 0 10*3/uL (ref 0.0–0.1)
Basophils Relative: 0 %
Eosinophils Absolute: 0.5 10*3/uL (ref 0.0–0.5)
Eosinophils Relative: 6 %
HCT: 34.3 % — ABNORMAL LOW (ref 36.0–46.0)
Hemoglobin: 10.1 g/dL — ABNORMAL LOW (ref 12.0–15.0)
Immature Granulocytes: 1 %
Lymphocytes Relative: 24 %
Lymphs Abs: 1.9 10*3/uL (ref 0.7–4.0)
MCH: 28.2 pg (ref 26.0–34.0)
MCHC: 29.4 g/dL — ABNORMAL LOW (ref 30.0–36.0)
MCV: 95.8 fL (ref 80.0–100.0)
Monocytes Absolute: 0.9 10*3/uL (ref 0.1–1.0)
Monocytes Relative: 11 %
Neutro Abs: 4.7 10*3/uL (ref 1.7–7.7)
Neutrophils Relative %: 58 %
Platelets: 114 10*3/uL — ABNORMAL LOW (ref 150–400)
RBC: 3.58 MIL/uL — ABNORMAL LOW (ref 3.87–5.11)
RDW: 15 % (ref 11.5–15.5)
WBC: 8 10*3/uL (ref 4.0–10.5)
nRBC: 0 % (ref 0.0–0.2)

## 2021-04-25 LAB — URINALYSIS, ROUTINE W REFLEX MICROSCOPIC
Bacteria, UA: NONE SEEN
Bilirubin Urine: NEGATIVE
Glucose, UA: >=500 mg/dL — AB
Hgb urine dipstick: NEGATIVE
Ketones, ur: 20 mg/dL — AB
Leukocytes,Ua: NEGATIVE
Nitrite: NEGATIVE
Protein, ur: NEGATIVE mg/dL
Specific Gravity, Urine: 1.032 — ABNORMAL HIGH (ref 1.005–1.030)
pH: 6 (ref 5.0–8.0)

## 2021-04-25 LAB — MAGNESIUM: Magnesium: 2.2 mg/dL (ref 1.7–2.4)

## 2021-04-25 LAB — GLUCOSE, CAPILLARY
Glucose-Capillary: 114 mg/dL — ABNORMAL HIGH (ref 70–99)
Glucose-Capillary: 128 mg/dL — ABNORMAL HIGH (ref 70–99)
Glucose-Capillary: 132 mg/dL — ABNORMAL HIGH (ref 70–99)
Glucose-Capillary: 132 mg/dL — ABNORMAL HIGH (ref 70–99)
Glucose-Capillary: 133 mg/dL — ABNORMAL HIGH (ref 70–99)
Glucose-Capillary: 138 mg/dL — ABNORMAL HIGH (ref 70–99)
Glucose-Capillary: 169 mg/dL — ABNORMAL HIGH (ref 70–99)

## 2021-04-25 LAB — BRAIN NATRIURETIC PEPTIDE: B Natriuretic Peptide: 305.1 pg/mL — ABNORMAL HIGH (ref 0.0–100.0)

## 2021-04-25 LAB — PHOSPHORUS: Phosphorus: 3.8 mg/dL (ref 2.5–4.6)

## 2021-04-25 LAB — MRSA NEXT GEN BY PCR, NASAL: MRSA by PCR Next Gen: DETECTED — AB

## 2021-04-25 LAB — PROCALCITONIN: Procalcitonin: <0.1 ng/mL

## 2021-04-25 IMAGING — DX DG CHEST 1V PORT
1 series · 1 of 1 positions shown · non-contrast
Comparison: [DATE].

CLINICAL DATA: Shortness of breath.

EXAM:
PORTABLE CHEST 1 VIEW

[chest]
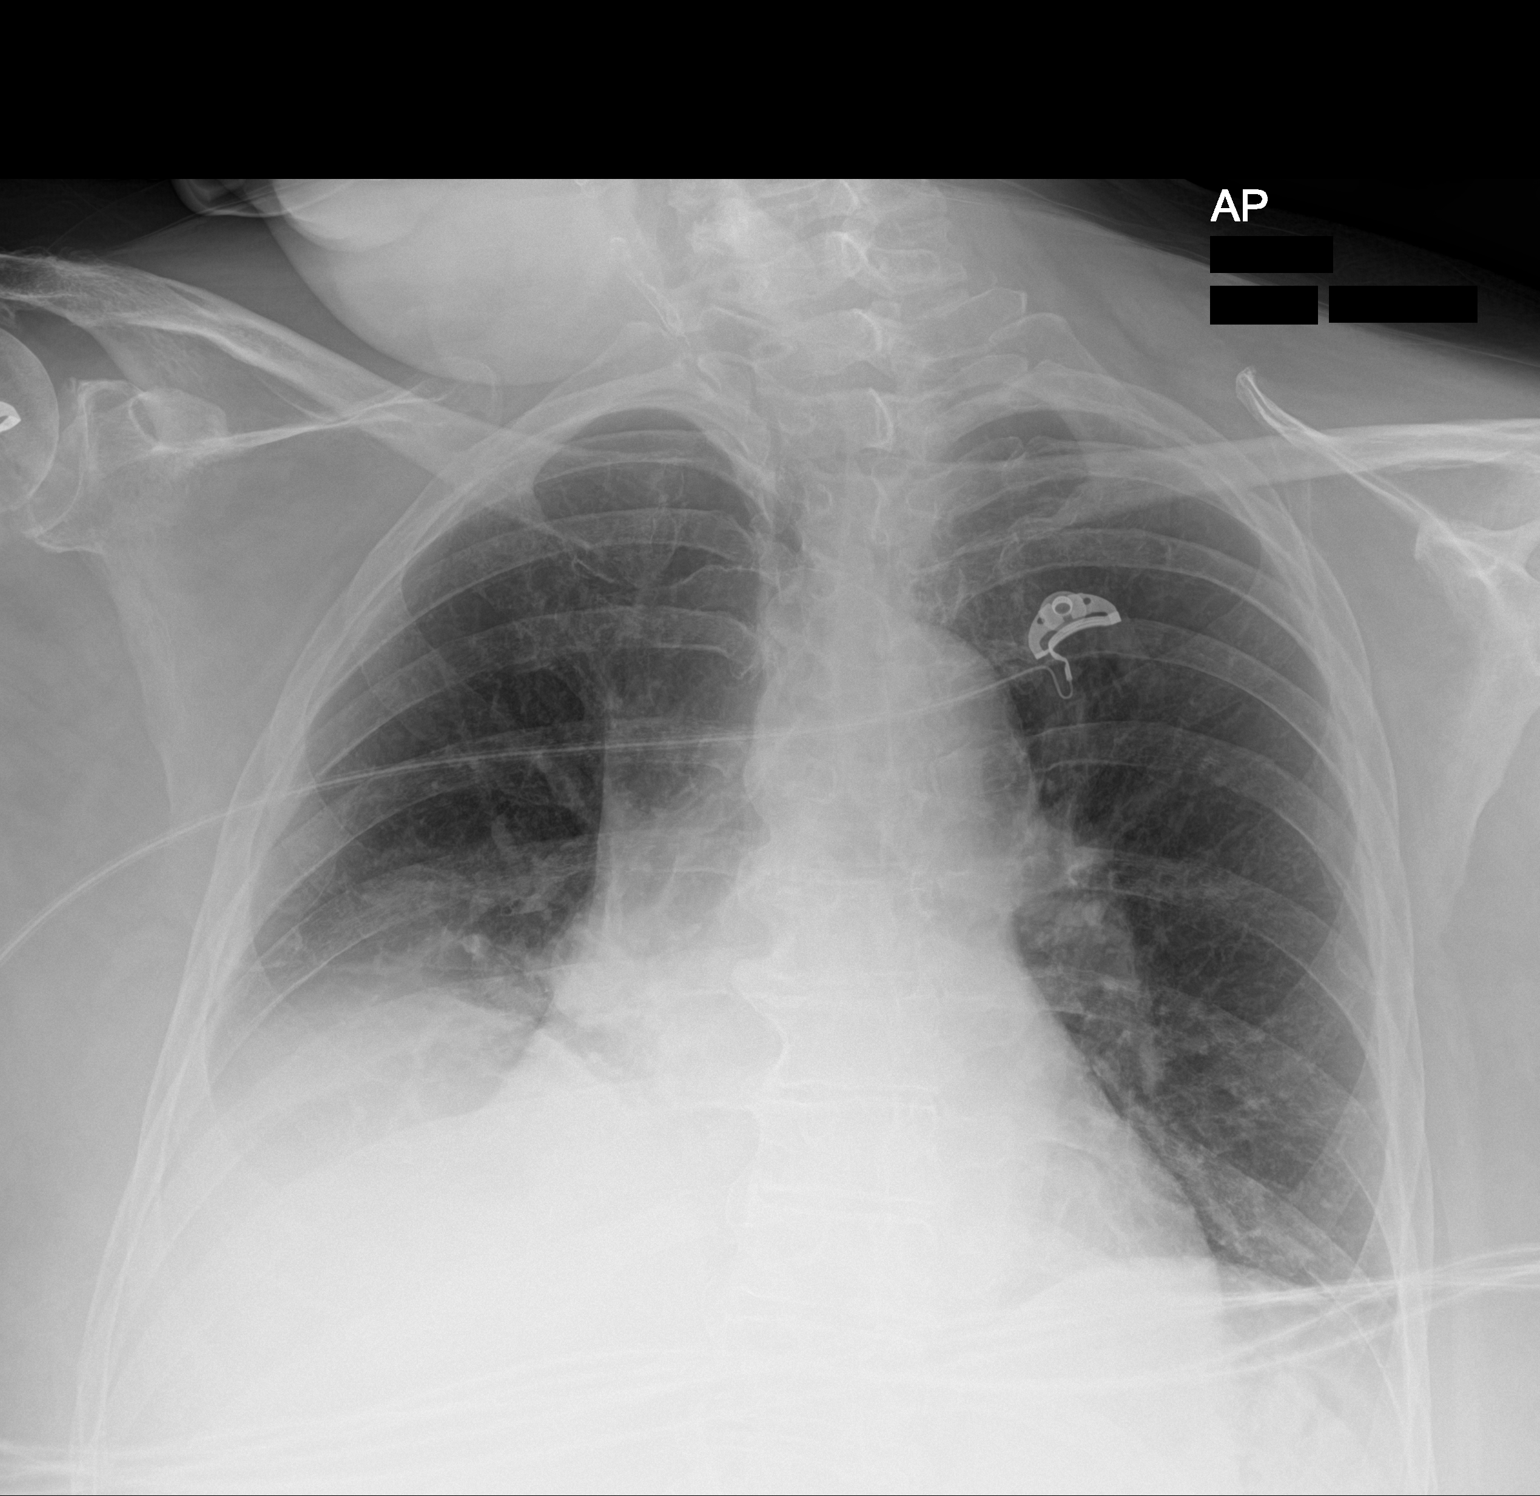

[1 of 1 positions shown; findings below may reference images not displayed]

FINDINGS: Stable cardiomediastinal silhouette. No pneumothorax is noted. Left
lung is clear. Stable elevated right hemidiaphragm is noted with
right basilar atelectasis or infiltrate and associated pleural
effusion. Bony thorax is unremarkable.
IMPRESSION: Stable elevated right hemidiaphragm is noted with right basilar
atelectasis or infiltrate and associated pleural effusion.

## 2021-04-25 MED ORDER — PANTOPRAZOLE SODIUM 40 MG IV SOLR
40.0000 mg | INTRAVENOUS | Status: DC
  Administered 2021-04-25 – 2021-04-27 (×3): 40 mg via INTRAVENOUS
  Filled 2021-04-25 (×3): qty 40

## 2021-04-25 MED ORDER — SODIUM CHLORIDE 0.9 % IV SOLN
250.0000 mL | INTRAVENOUS | Status: DC
  Administered 2021-04-25: 250 mL via INTRAVENOUS

## 2021-04-25 MED ORDER — ENOXAPARIN SODIUM 100 MG/ML IJ SOSY
95.0000 mg | PREFILLED_SYRINGE | Freq: Two times a day (BID) | INTRAMUSCULAR | Status: DC
  Administered 2021-04-25 (×2): 95 mg via SUBCUTANEOUS
  Filled 2021-04-25 (×3): qty 0.95

## 2021-04-25 MED ORDER — INSULIN ASPART 100 UNIT/ML IJ SOLN
0.0000 [IU] | INTRAMUSCULAR | Status: DC
  Administered 2021-04-25 (×2): 2 [IU] via SUBCUTANEOUS
  Administered 2021-04-25 – 2021-04-26 (×5): 3 [IU] via SUBCUTANEOUS
  Administered 2021-04-26: 8 [IU] via SUBCUTANEOUS
  Administered 2021-04-26: 3 [IU] via SUBCUTANEOUS
  Administered 2021-04-27: 2 [IU] via SUBCUTANEOUS
  Administered 2021-04-27 (×2): 3 [IU] via SUBCUTANEOUS

## 2021-04-25 MED ORDER — NOREPINEPHRINE 4 MG/250ML-% IV SOLN
2.0000 ug/min | INTRAVENOUS | Status: DC
  Administered 2021-04-25: 2 ug/min via INTRAVENOUS
  Administered 2021-04-26: 6 ug/min via INTRAVENOUS
  Filled 2021-04-25 (×2): qty 250

## 2021-04-25 MED ORDER — DIVALPROEX SODIUM 250 MG PO DR TAB
750.0000 mg | DELAYED_RELEASE_TABLET | Freq: Two times a day (BID) | ORAL | Status: DC
  Administered 2021-04-25 – 2021-05-05 (×21): 750 mg via ORAL
  Filled 2021-04-25: qty 1
  Filled 2021-04-25 (×3): qty 3
  Filled 2021-04-25: qty 1
  Filled 2021-04-25: qty 3
  Filled 2021-04-25 (×2): qty 1
  Filled 2021-04-25: qty 3
  Filled 2021-04-25 (×3): qty 1
  Filled 2021-04-25: qty 3
  Filled 2021-04-25 (×5): qty 1
  Filled 2021-04-25 (×3): qty 3

## 2021-04-25 MED ORDER — WHITE PETROLATUM EX OINT
TOPICAL_OINTMENT | CUTANEOUS | Status: AC
  Administered 2021-04-25: 1
  Filled 2021-04-25: qty 28.35

## 2021-04-25 MED ORDER — LACTATED RINGERS IV BOLUS
500.0000 mL | Freq: Once | INTRAVENOUS | Status: AC
  Administered 2021-04-25: 500 mL via INTRAVENOUS

## 2021-04-25 MED ORDER — NOREPINEPHRINE 4 MG/250ML-% IV SOLN: 0.0000 ug/min | INTRAVENOUS | Status: DC

## 2021-04-25 MED ORDER — ORAL CARE MOUTH RINSE
15.0000 mL | Freq: Two times a day (BID) | OROMUCOSAL | Status: DC
  Administered 2021-04-26 – 2021-05-05 (×16): 15 mL via OROMUCOSAL

## 2021-04-25 NOTE — Consult Note (Signed)
Reason for Consult: Severe Hallucinations, Paranoia, Bipolar Disorder and labile mood Referring Physician: Merlene Laughter, DO  Becky Hendricks is an 75 y.o. female.  HPI:  Becky Hendricks is a 75 yr old female with a history of chronic hypoxic hypercapnic respiratory failure secondary to OSA, noncompliant with BiPAP at night, morbid obesity, IDDM, HTN, PAF on Eliquis, bipolar disorder, sent from nursing home for AMS. Recently discharged from the hospital on 8/26 and 9/7 after admissions stemming from acute hypoxic and hypercarbic respiratory failure due to her refusal to wear a BiPAP at night.   Significant Event: Around 5 AM this morning patient was found to have decreased LOC and hypotension.  She was moved to the ICU and her ABGs obtained showed hypercapnic respiratory failure.   When seen this morning patient was unresponsive and on BiPAP.  ICU nurse was present at bedside she reported that earlier patient had been able to follow some commands.  Patient was able to move all 4 limbs to command.  However, patient soon became unresponsive to all but vigorous noxious stimuli and then with only moans/groans.  Nurse related that currently ICU team was considering intubation.  Past Medical History:  Diagnosis Date   Atrial fibrillation (HCC)    Bipolar disorder (HCC)    Bipolar disorder (HCC) 04/27/2020   Clostridium difficile diarrhea 08/02/2019   Dehydration    Essential hypertension    Fall 02/11/2020   Morbid obesity (HCC)    Osteomyelitis (HCC) 12/03/2019   Pneumonia due to COVID-19 virus 09/15/2019   Type 2 diabetes mellitus (HCC)    Weakness     Past Surgical History:  Procedure Laterality Date   INCISION AND DRAINAGE PERIRECTAL ABSCESS Left 12/04/2019   Procedure: IRRIGATION AND DEBRIDEMENT LEFT BUTTOCK ABSCESS;  Surgeon: Violeta Gelinas, MD;  Location: [facility] OR;  Service: General;  Laterality: Left;   INCISION AND DRAINAGE PERIRECTAL ABSCESS Left 12/10/2019   Procedure: REPEAT  IRRIGATION AND DEBRIDEMENT BUTTOCK  ABSCESS;  Surgeon: Abigail Miyamoto, MD;  Location: [facility] OR;  Service: General;  Laterality: Left;   IR FLUORO GUIDE CV LINE RIGHT  11/25/2020   IR RADIOLOGIST EVAL & MGMT  12/29/2020   IR US GUIDE VASC ACCESS RIGHT  11/25/2020    Family History  Family history unknown: Yes    Social History:  reports that she has never smoked. She has never used smokeless tobacco. She reports that she does not currently use alcohol. She reports that she does not currently use drugs.  Allergies:  Allergies  Allergen Reactions   Chlorhexidine Gluconate Itching   Vancomycin     Vancomycin infusion related reaction- May need Vanc to be given at a slower rate    Acetazolamide Er Rash    Medications: I have reviewed the patient's current medications.  Results for orders placed or performed during the hospital encounter of 04/21/21 (from the past 48 hour(s))  Glucose, capillary     Status: Abnormal   Collection Time: 04/23/21 12:26 PM  Result Value Ref Range   Glucose-Capillary 132 (H) 70 - 99 mg/dL    Comment: Glucose reference range applies only to samples taken after fasting for at least 8 hours.  Glucose, capillary     Status: Abnormal   Collection Time: 04/23/21  4:32 PM  Result Value Ref Range   Glucose-Capillary 174 (H) 70 - 99 mg/dL    Comment: Glucose reference range applies only to samples taken after fasting for at least 8 hours.  Glucose, capillary  Status: Abnormal   Collection Time: 04/23/21  8:29 PM  Result Value Ref Range   Glucose-Capillary 163 (H) 70 - 99 mg/dL    Comment: Glucose reference range applies only to samples taken after fasting for at least 8 hours.  Glucose, capillary     Status: Abnormal   Collection Time: 04/24/21  7:45 AM  Result Value Ref Range   Glucose-Capillary 180 (H) 70 - 99 mg/dL    Comment: Glucose reference range applies only to samples taken after fasting for at least 8 hours.  CBC with Differential/Platelet      Status: Abnormal   Collection Time: 04/24/21 10:51 AM  Result Value Ref Range   WBC 8.7 4.0 - 10.5 K/uL   RBC 3.92 3.87 - 5.11 MIL/uL   Hemoglobin 11.1 (L) 12.0 - 15.0 g/dL   HCT 95.6 21.3 - 08.6 %   MCV 94.1 80.0 - 100.0 fL   MCH 28.3 26.0 - 34.0 pg   MCHC 30.1 30.0 - 36.0 g/dL   RDW 57.8 46.9 - 62.9 %   Platelets 132 (L) 150 - 400 K/uL    Comment: REPEATED TO VERIFY   nRBC 0.0 0.0 - 0.2 %   Neutrophils Relative % 71 %   Neutro Abs 6.1 1.7 - 7.7 K/uL   Lymphocytes Relative 17 %   Lymphs Abs 1.5 0.7 - 4.0 K/uL   Monocytes Relative 9 %   Monocytes Absolute 0.8 0.1 - 1.0 K/uL   Eosinophils Relative 2 %   Eosinophils Absolute 0.2 0.0 - 0.5 K/uL   Basophils Relative 0 %   Basophils Absolute 0.0 0.0 - 0.1 K/uL   Immature Granulocytes 1 %   Abs Immature Granulocytes 0.07 0.00 - 0.07 K/uL    Comment: Performed at [facility], [address], [place], [place] [address]  Comprehensive metabolic panel     Status: Abnormal   Collection Time: 04/24/21 10:51 AM  Result Value Ref Range   Sodium 142 135 - 145 mmol/L   Potassium 3.6 3.5 - 5.1 mmol/L   Chloride 94 (L) 98 - 111 mmol/L   CO2 38 (H) 22 - 32 mmol/L   Glucose, Bld 165 (H) 70 - 99 mg/dL    Comment: Glucose reference range applies only to samples taken after fasting for at least 8 hours.   BUN 19 8 - 23 mg/dL   Creatinine, Ser 3.24 0.44 - 1.00 mg/dL   Calcium 8.6 (L) 8.9 - 10.3 mg/dL   Total Protein 5.4 (L) 6.5 - 8.1 g/dL   Albumin 2.3 (L) 3.5 - 5.0 g/dL   AST 9 (L) 15 - 41 U/L   ALT 9 0 - 44 U/L   Alkaline Phosphatase 30 (L) 38 - 126 U/L   Total Bilirubin 1.1 0.3 - 1.2 mg/dL   GFR, Estimated >40 >10 mL/min    Comment: (NOTE) Calculated using the CKD-EPI Creatinine Equation (2021)    Anion gap 10 5 - 15    Comment: Performed at [facility], [address] [place], [place] [address]  Magnesium     Status: None   Collection Time: 04/24/21 10:51 AM  Result Value Ref Range   Magnesium 2.1 1.7 - 2.4 mg/dL     Comment: Performed at [facility], [address], [place], [place] [address]  Phosphorus     Status: None   Collection Time: 04/24/21 10:51 AM  Result Value Ref Range   Phosphorus 4.5 2.5 - 4.6 mg/dL  Comment: Performed at [facility], [address], [place], [place] [address]  Glucose, capillary     Status: Abnormal   Collection Time: 04/24/21 11:57 AM  Result Value Ref Range   Glucose-Capillary 146 (H) 70 - 99 mg/dL    Comment: Glucose reference range applies only to samples taken after fasting for at least 8 hours.  Glucose, capillary     Status: Abnormal   Collection Time: 04/24/21  4:18 PM  Result Value Ref Range   Glucose-Capillary 116 (H) 70 - 99 mg/dL    Comment: Glucose reference range applies only to samples taken after fasting for at least 8 hours.  Glucose, capillary     Status: Abnormal   Collection Time: 04/24/21  9:10 PM  Result Value Ref Range   Glucose-Capillary 195 (H) 70 - 99 mg/dL    Comment: Glucose reference range applies only to samples taken after fasting for at least 8 hours.  Glucose, capillary     Status: Abnormal   Collection Time: 04/25/21 12:21 AM  Result Value Ref Range   Glucose-Capillary 133 (H) 70 - 99 mg/dL    Comment: Glucose reference range applies only to samples taken after fasting for at least 8 hours.  Blood gas, venous     Status: Abnormal   Collection Time: 04/25/21  1:02 AM  Result Value Ref Range   FIO2 40.00    pH, Ven 7.368 7.250 - 7.430   pCO2, Ven 77.2 (HH) 44.0 - 60.0 mmHg    Comment: CRITICAL RESULT CALLED TO, READ BACK BY AND VERIFIED WITH: C GIBSON RN ON 04/25/21@0118  BY CHAYES    pO2, Ven 53.0 (H) 32.0 - 45.0 mmHg   Bicarbonate 43.4 (H) 20.0 - 28.0 mmol/L   Acid-Base Excess 17.1 (H) 0.0 - 2.0 mmol/L   O2 Saturation 85.0 %   Patient temperature 37.0    Drawn by [id number]    Sample type VENOUS     Comment: Performed at [facility], [address], [place], [place] [address]  CBC with  Differential/Platelet     Status: Abnormal   Collection Time: 04/25/21  5:00 AM  Result Value Ref Range   WBC 8.0 4.0 - 10.5 K/uL   RBC 3.58 (L) 3.87 - 5.11 MIL/uL   Hemoglobin 10.1 (L) 12.0 - 15.0 g/dL   HCT 46.2 (L) 70.3 - 50.0 %   MCV 95.8 80.0 - 100.0 fL   MCH 28.2 26.0 - 34.0 pg   MCHC 29.4 (L) 30.0 - 36.0 g/dL   RDW 93.8 18.2 - 99.3 %   Platelets 114 (L) 150 - 400 K/uL    Comment: Immature Platelet Fraction may be clinically indicated, consider ordering this additional test [id number] REPEATED TO VERIFY PLATELET COUNT CONFIRMED BY SMEAR    nRBC 0.0 0.0 - 0.2 %   Neutrophils Relative % 58 %   Neutro Abs 4.7 1.7 - 7.7 K/uL   Lymphocytes Relative 24 %   Lymphs Abs 1.9 0.7 - 4.0 K/uL   Monocytes Relative 11 %   Monocytes Absolute 0.9 0.1 - 1.0 K/uL   Eosinophils Relative 6 %   Eosinophils Absolute 0.5 0.0 - 0.5 K/uL   Basophils Relative 0 %   Basophils Absolute 0.0 0.0 - 0.1 K/uL   Immature Granulocytes 1 %   Abs Immature Granulocytes 0.06 0.00 - 0.07 K/uL    Comment: Performed at [facility], [address] [place], [place] [address]  Comprehensive metabolic panel  Status: Abnormal   Collection Time: 04/25/21  5:00 AM  Result Value Ref Range   Sodium 140 135 - 145 mmol/L   Potassium 3.6 3.5 - 5.1 mmol/L   Chloride 90 (L) 98 - 111 mmol/L   CO2 42 (H) 22 - 32 mmol/L   Glucose, Bld 119 (H) 70 - 99 mg/dL    Comment: Glucose reference range applies only to samples taken after fasting for at least 8 hours.   BUN 20 8 - 23 mg/dL   Creatinine, Ser 6.96 0.44 - 1.00 mg/dL   Calcium 8.6 (L) 8.9 - 10.3 mg/dL   Total Protein 5.0 (L) 6.5 - 8.1 g/dL   Albumin 2.2 (L) 3.5 - 5.0 g/dL   AST 8 (L) 15 - 41 U/L   ALT 8 0 - 44 U/L   Alkaline Phosphatase 28 (L) 38 - 126 U/L   Total Bilirubin 0.9 0.3 - 1.2 mg/dL   GFR, Estimated >29 >52 mL/min    Comment: (NOTE) Calculated using the CKD-EPI Creatinine Equation (2021)    Anion gap 8 5 - 15    Comment: Performed at [facility], [address] [place], [place] [address]  Phosphorus     Status: None   Collection Time: 04/25/21  5:00 AM  Result Value Ref Range   Phosphorus 3.8 2.5 - 4.6 mg/dL    Comment: Performed at [facility], [address] [place], [place] [address]  Magnesium     Status: None   Collection Time: 04/25/21  5:00 AM  Result Value Ref Range   Magnesium 2.2 1.7 - 2.4 mg/dL    Comment: Performed at [facility], [address], [place], [place] [address]  Brain natriuretic peptide     Status: Abnormal   Collection Time: 04/25/21  5:05 AM  Result Value Ref Range   B Natriuretic Peptide 305.1 (H) 0.0 - 100.0 pg/mL    Comment: Performed at [facility], [address] [place], [place] [address]  Procalcitonin - Baseline     Status: None   Collection Time: 04/25/21  5:05 AM  Result Value Ref Range   Procalcitonin <0.10 ng/mL    Comment:        Interpretation: PCT (Procalcitonin) <= 0.5 ng/mL: Systemic infection (sepsis) is not likely. Local bacterial infection is possible. (NOTE)       Sepsis PCT Algorithm           Lower Respiratory Tract                                      Infection PCT Algorithm    ----------------------------     ----------------------------         PCT < 0.25 ng/mL                PCT < 0.10 ng/mL          Strongly encourage             Strongly discourage   discontinuation of antibiotics    initiation of antibiotics    ----------------------------     -----------------------------       PCT 0.25 - 0.50 ng/mL            PCT 0.10 - 0.25 ng/mL               OR       >  80% decrease in PCT            Discourage initiation of                                            antibiotics      Encourage discontinuation           of antibiotics    ----------------------------     -----------------------------         PCT >= 0.50 ng/mL              PCT 0.26 - 0.50 ng/mL               AND        <80% decrease in PCT             Encourage initiation  of                                             antibiotics       Encourage continuation           of antibiotics    ----------------------------     -----------------------------        PCT >= 0.50 ng/mL                  PCT > 0.50 ng/mL               AND         increase in PCT                  Strongly encourage                                      initiation of antibiotics    Strongly encourage escalation           of antibiotics                                     -----------------------------                                           PCT <= 0.25 ng/mL                                                 OR                                        > 80% decrease in PCT                                      Discontinue / Do not initiate  antibiotics  Performed at [facility], [address], [place], [place] [address]   Glucose, capillary     Status: Abnormal   Collection Time: 04/25/21  5:49 AM  Result Value Ref Range   Glucose-Capillary 132 (H) 70 - 99 mg/dL    Comment: Glucose reference range applies only to samples taken after fasting for at least 8 hours.  MRSA Next Gen by PCR, Nasal     Status: Abnormal   Collection Time: 04/25/21  5:54 AM   Specimen: Nasal Mucosa; Nasal Swab  Result Value Ref Range   MRSA by PCR Next Gen DETECTED (A) NOT DETECTED    Comment: RESULT CALLED TO, READ BACK BY AND VERIFIED WITH: RN MICHELLE TOLLER 04/25/21@7 :05 BY TW (NOTE) The GeneXpert MRSA Assay (FDA approved for NASAL specimens only), is one component of a comprehensive MRSA colonization surveillance program. It is not intended to diagnose MRSA infection nor to guide or monitor treatment for MRSA infections. Test performance is not FDA approved in patients less than 15 years old. Performed at [facility], [address], [place], [place] [address]   I-STAT 7, (LYTES, BLD GAS, ICA, H+H)     Status: Abnormal   Collection Time: 04/25/21   6:26 AM  Result Value Ref Range   pH, Arterial 7.406 7.350 - 7.450   pCO2 arterial 74.7 (HH) 32.0 - 48.0 mmHg   pO2, Arterial 80 (L) 83.0 - 108.0 mmHg   Bicarbonate 46.8 (H) 20.0 - 28.0 mmol/L   TCO2 49 (H) 22 - 32 mmol/L   O2 Saturation 95.0 %   Acid-Base Excess 19.0 (H) 0.0 - 2.0 mmol/L   Sodium 141 135 - 145 mmol/L   Potassium 3.6 3.5 - 5.1 mmol/L   Calcium, Ion 1.17 1.15 - 1.40 mmol/L   HCT 32.0 (L) 36.0 - 46.0 %   Hemoglobin 10.9 (L) 12.0 - 15.0 g/dL   Patient temperature 09.8 F    Collection site Radial    Drawn by RT    Sample type ARTERIAL    Comment NOTIFIED PHYSICIAN   Glucose, capillary     Status: Abnormal   Collection Time: 04/25/21  8:08 AM  Result Value Ref Range   Glucose-Capillary 132 (H) 70 - 99 mg/dL    Comment: Glucose reference range applies only to samples taken after fasting for at least 8 hours.    No results found.  Review of Systems  Unable to perform ROS: Patient unresponsive  Blood pressure (!) 95/56, pulse 95, temperature 98.1 F (36.7 C), temperature source Axillary, resp. rate 14, height  (1.676 m), weight 98.4 kg, SpO2 99 %. Physical Exam Vitals and nursing note reviewed.  Constitutional:      General: She is not in acute distress.    Appearance: She is obese. She is ill-appearing. She is not toxic-appearing.  HENT:     Head: Normocephalic and atraumatic.  Musculoskeletal:        General: Normal range of motion.     Comments: Per nursing when she was responsive   Neurological:     Comments: unresponsive    Assessment/Plan: Case and Plan discussed with Dr. Lucianne Muss  Mrs. Gisler is a 75 yr old female with a history of chronic hypoxic hypercapnic respiratory failure secondary to OSA, noncompliant with BiPAP at night, morbid obesity, IDDM, HTN, PAF on Eliquis, bipolar disorder, sent from nursing home for AMS. Recently discharged from the hospital on 8/26 and 9/7 after admissions stemming from acute hypoxic  and hypercarbic respiratory  failure due to her refusal to wear a BiPAP at night.     During interview patient was unresponsive.  Per ICU nurse patient will most likely be intubated this afternoon.  At this time prognosis does not look good.  We will stop the 2 PM dose of Risperdal in case this is adding to her sedation/unresponsiveness, however, most likely this is due to to her respiratory failure and hypercapnia.  We will continue to follow.    Bipolar Disorder: -Continue Depakote 750 mg BID -Continue Risperdal M-tabs 2 mg BID  -Stop Risperdal M-tab 1 mg at 2 PM     Hyperammoniemia: -Continue Lactulose 10 g BID     Continue rest of care per Primary Team Psychiatry to continue to follow     Disposition: No evidence of imminent risk to self or others at present. Does not meet criteria for inpatient treatment.     Lauro Franklin 04/25/2021, 9:31 AM

## 2021-04-25 NOTE — Progress Notes (Signed)
Patient moved to [facility] 07 with primary RN, Rapid response RN and RT. Receiving RN at bedside. Handoff completed.  Brother [name] called and updated on condition.

## 2021-04-25 NOTE — Progress Notes (Addendum)
Notified by RN that pt with low BP 75-95/40-55. Given 500 ml bolus of NS. Bp improved a little but still has low MAP.  No complaint of chest pain. Has confusion per RN.  Give another small bolus of LR and if BP does not improve may need low dose pressor therapy.  Check VBG  Addendum: VBG shows pH 7.368, pCO2-77.2, pO2-53, bicarb 43.4.  Will place on Bipap and monitor.   Addendum: 450 am Pt continues to have low BP. Has had LR bolus and BP will temporarily elevate then go back down.  SBP 70-90 with MAP in 50's.    Start Levophed and transfer to ICU

## 2021-04-25 NOTE — Consult Note (Signed)
WOC Nurse Consult Note: Reason for Consult: pressure injury Wound type: Healed pressure injury; sacrum; small open area partial thickness; previously healed  Deep Tissue Pressure Injury; Medical Device Related; linear area lower back over spine Pressure Injury POA: Yes Measurement:  Lower back: 0.3cm x 3cm x 0cm; 100% dark purple non blanchable  Sacrum: 0.2cm x 0.2cm x 0.1cm; 100% pink, moist Wound bed: see above  Drainage (amount, consistency, odor) none  Periwound:intact  Dressing procedure/placement/frequency: Silicone foam to both the lower back and sacrum; change every 3 days and PRN soilage.  Low air loss mattress for moisture management and pressure redistribution while in the ICU; will need air mattress if she transfers to the floor.   Discussed POC with patient and bedside nurse.  Re consult if needed, will not follow at this time. Thanks  Kemper Heupel [employer], RN,CWOCN, CNS, CWON-AP [phone])

## 2021-04-25 NOTE — Progress Notes (Signed)
PROGRESS NOTE    Becky Hendricks  KXF:[id number] DOB: November 21, 1945 DOA: 04/21/2021 PCP: Roderic Scarce, MD  Brief Narrative:  The patient is a 75 year old obese [place] female with a past medical history significant for blood #2 chronic hypoxic hypercapnic respiratory failure secondary to OSA with noncompliant with BiPAP, obesity, insulin-dependent diabetes mellitus type 2, hypertension, proximal atrial fibrillation on Eliquis, bipolar disorder as well as other comorbidities who was sent from her facility for AMS.  She has been recently hospitalized 2 times in 1 month for similar condition of hypoxic hypercapnic respiratory failure all because she will refuse to wear her BiPAP as well as including 1 recent hospital stay where she was intubated and extubated and discharged 2 days ago.  The morning of admission she was found obtunded in her bed and was not wearing her BiPAP.  She presented ED and showed decompensated acute on chronic HFrEF Hypoxic respiratory failure with a pH of 7.29 and a PCO2 of 100.  After 2 hours of BiPAP she improved to 10.32 with a PCO2 of 95 and was more awake.  Chest x-ray showed similar bilateral lower lung field changes and she became agitated and is now hallucinating significantly confused so psychiatry has been consulted.  Psychiatry recommending Depakote, Risperdal and adding a 1 mg dose at 2:00 pm. Ammonia level and Depakote ordered and Ammonia level was elevated to 101 so will start Lactulose at 10 mg po BID. She remains in A Fib with RVR on a Dilt gtt so will consult Cardiology for further evaluation. Also remains volume overloaded so will give IV Lasix 40 mg again today.   Overnight she decompensated became extremely hypotensive.  Given a 500 mL bolus of normal saline and 500 mill bolus of LR.  She was transferred to the ICU and started on Levophed.  She remains on peripheral Levophed this morning so pulm was consulted.  She was extremely somnolent and had to be placed on BiPAP  this morning.  Pulmonary considered intubation however she woke up on the BiPAP and a feels no intubation needed at present.  Cardizem drip has been weaned off and Levophed is being weaned off as well.  Assessment & Plan:   Active Problems:   Acute on chronic respiratory failure with hypoxia and hypercapnia (HCC)   Acute and chronic respiratory failure with hypercapnia (HCC)   Respiratory failure (HCC)  Acute Metabolic Encephalopathy in the setting of acute on chronic hypercapnic hypoxic respiratory failure respiratory acidosis along in the setting of OSA with noncompliance of BiPAP as well as acute on chronic diastolic CHF -She has a significant history of acute on chronic hypercapnic hypoxic respiratory failure and respiratory acidosis -She is familiar to [facility]  And recently was discharged 3 days ago.  Her brother lives in [place] and she has a very poor prognosis but he wants referral: None she was admitted to the PCU -She continues to be significantly agitated and hallucinating and very upset -She has a short-term and long-term poor prognosis given her repeated failure noncompliance with BiPAP and is at risk for sudden death    Component Value Date/Time   PHART 7.406 04/25/2021 0626   PCO2ART 74.7 (HH) 04/25/2021 0626   PO2ART 80 (L) 04/25/2021 0626   HCO3 46.8 (H) 04/25/2021 0626   TCO2 49 (H) 04/25/2021 0626   O2SAT 95.0 04/25/2021 0626  -Continue supplemental oxygen via nasal cannula and BiPAP as tolerated and wean oxygen to baseline -Continuous pulse oximetry maintain O2 saturation greater 90% -SpO2: 98 %  O2 Flow Rate (L/min): 6 L/min FiO2 (%): 40 %: Back on BiPAP because she was encephalopathic and somnolent -Likely her encephalopathy is in the setting of her hypercapnic issues and her chest x-ray showed chronic changes and she has received antibiotics on both admissions recently but will not initiate this time -Patient is allergic to acetazolamide; continue with  spironolactone 12 5 mg p.o. every other day and was given a dose of IV Lasix 60 mg and given that she appeared volume overloaded and a dose of IV Lasix 40 mg; She is still is + 14.3 mL since admission  -C/w Duoneb q6hprn Wheezing and SOB -BNP was 154.5 -Continue to monitor respiratory status carefully and continue with BiPAP nightly; Will need BiPAP qHS; because she decompensated overnight she is now in the ICU and pulmonary is following -We will need a chest x-ray in the a.m. and she will need an ambulatory home O2 screen prior to discharge  Bipolar disorder, hallucinations and significant agitation and paranoia as well as psychosis -Psychiatry has been consulted for further evaluation recommendations but will need to rule out infection first and checking a urinalysis and urine culture as well as blood cultures x2 given her history of bacteremia -Continue with divalproex 750 mg p.o. twice daily as well as continuing risperidone 2 mg p.o. twice daily; psychiatry had added additional 1 mg of risperidone at 2 PM however this was stopped because of her somnolence -Has Haldol lactate 5 mg IV every 6 as needed agitation -Further care per Psychiatry  Hyperammonemia -Ammonia Level was 101 and repeat was 23 -Started Lactulose 20 grams BID but decrease to 10 mg BID  -Psych Recommends continuing Lactulose 10 mg po BID  Acute on Chronic hypercapnic/hypoxic respiratory failure with respiratory acidosis Acute on chronic diastolic CHF -See as above -She has grade 2 diastolic dysfunction and elevated left atrial pressure -Strict I's and O's and daily weights; She is improving from a volume status  -She is given IV Lasix yesterday and cardiology was consulted -Continue with BiPAP as needed and nightly -Repeat CXR on 04/23/21 showed "Slight increase in right-sided pleural effusion with increased adjacent right base airspace disease. Further improvement in left base aeration. Cardiomegaly with improved mild  pulmonary venous congestion." -Continue to Monitor for S/Sx of Volume Overload  -As above  Hypotension -In the setting of diltiazem drip and Lasix administration -Her diltiazem drip was stopped and she was given 2 500 mL boluses because her blood pressure remained low still she was transferred to the ICU and placed on Levophed -Currently Levophed is being weaned by pulmonary -Continue monitor blood pressures per protocol  PAF with RVR; -Was having Rates in the 130's and sustaining  -C/w Telemetry Monitoring  -C/w Apixaban 5 mg po BID  -Remains on a Diltiazem gtt but became extremely hypotensive so diltiazem drip was stopped -Beta-blocker was held in setting of Bradycardia but will resume Metoprolol 12.5 mg po BID -Continue Telemetry Monitoring  -Cardiology consulted for further evaluation and recommendations -Recent ECHO done last month   Normocytic Anemia -Patient's Hgb/Hct went from 9.3/31.9 -> 10.2/30.0 -> 9.7/31.9 -> 10.5/33.6 -> 11.1/36.9 ->? 10.1/34.3 -> 10.9/32.0 -C/w Ferrous Sulfate 324 mg po Daily  -Check Anemia Panel in the AM -Continue to Monitor for S/Sx of Bleeding  -Repeat CBC in the AM   Thrombocytopenia -Patient's Platelet Count has gone from 157 -> 137 -> 148 -> 132 -> 114 -Continue to Monitor and Trend Repeat CMP int eh AM   Insulin-dependent diabetes Mellitus Type  2 -Continue with moderate NovoLog/scale insulin as well as linagliptin 5 mg p.o. daily as well as empagliflozin 10 mg p.o. daily -Continue monitor CBGs; CBG's ranging from 114-169  GERD/GI prophylaxis -Continue with Pantoprazole 40 once daily  Hyperlipidemia -C/w Atorvastatin 10 mg po qHS  Hypertension -Blood pressures had been elevated but overnight she became extremely hypotensive -Given IV Lasix yesterday but will hold further Lasix dose -Her beta-blocker was resumed yesterday as well but may need to hold -Continue monitor blood pressures per protocol -We will add IV hydralazine if  necessary -Last BP reading was 100/49 and will need to monitor closely as is on Levophed   Obesity -Complicates overall prognosis and care -Estimated body mass index is 35.01 kg/m as calculated from the following:   Height as of this encounter:  (1.676 m).   Weight as of this encounter: 98.4 kg. -Weight Loss and Dietary Counseling  -Nutritionist consulted and recommending Ensure Max po BID and MVI+ Minerals Daily    DVT prophylaxis: Anticoagulated with Apixaban Code Status: FULL CODE Family Communication: No family present at bedside  Disposition Plan: Pending further clinical improvement   Status is: Inpatient  Remains inpatient appropriate because:Unsafe d/c plan, IV treatments appropriate due to intensity of illness or inability to take PO, and Inpatient level of care appropriate due to severity of illness  Dispo: The patient is from: SNF              Anticipated d/c is to: SNF              Patient currently is not medically stable to d/c.   Difficult to place patient No  Consultants:  Psychiatry   Procedures: None  Antimicrobials:  Anti-infectives (From admission, onward)    None        Subjective: Seen and examined at bedside she was somnolent and difficult to arouse on the BiPAP.  Subsequently she woke up in the afternoon became very agitated.  Appeared calm.  Heart rate was still irregularly irregular.  On Levophed.  No other concerns or complaint this time  Objective: Vitals:   04/25/21 1630 04/25/21 1645 04/25/21 1700 04/25/21 1715  BP: (!) 92/51 (!) 83/55 (!) 87/52 (!) 100/49  Pulse: (!) 108 (!) 104 (!) 102 99  Resp: (!) 24 (!) 27 (!) 29 (!) 26  Temp:    98.3 F (36.8 C)  TempSrc:    Oral  SpO2: 100% 100% 100% 98%  Weight:      Height:        Intake/Output Summary (Last 24 hours) at 04/25/2021 1720 Last data filed at 04/25/2021 1600 Gross per 24 hour  Intake 1414.09 ml  Output 950 ml  Net 464.09 ml    Filed Weights   04/25/21 0550   Weight: 98.4 kg   Examination: Physical Exam:  Constitutional: WN/WD obese female in no acute distress appears somnolent and drowsy on BiPAP Eyes: Lids and conjunctivae normal, sclerae anicteric  ENMT: External Ears, Nose appear normal. Grossly normal hearing. Mucous membranes are moist. Neck: Appears normal, supple, no cervical masses, normal ROM, no appreciable thyromegaly; slight JVD Respiratory: Diminished to auscultation bilaterally with coarse breath sounds, no wheezing, rales, rhonchi or crackles. Normal respiratory effort and patient is not tachypenic. No accessory muscle use.  Wearing BiPAP Cardiovascular: RRR, no murmurs / rubs / gallops.  1+ lower extremity edema  abdomen: Soft, non-tender, distended secondary body habitus bowel sounds positive.  GU: Deferred. Musculoskeletal: No clubbing / cyanosis of digits/nails. Normal strength  and muscle tone.  Skin: No rashes, lesions, ulcers. No induration; Warm and dry.  Neurologic: She somnolent and drowsy does not follow commands Psychiatric: Impaired judgment and insight.  She is not awake or alert and remains somnolent on the BiPAP  Data Reviewed: I have personally reviewed following labs and imaging studies  CBC: Recent Labs  Lab 04/21/21 0944 04/21/21 0956 04/22/21 0912 04/23/21 0336 04/24/21 1051 04/25/21 0500 04/25/21 0626  WBC 6.6  --  6.6 7.1 8.7 8.0  --   NEUTROABS  --   --  3.6 3.5 6.1 4.7  --   HGB 9.9*   < > 9.7* 10.5* 11.1* 10.1* 10.9*  HCT 34.1*   < > 31.9* 33.6* 36.9 34.3* 32.0*  MCV 97.2  --  92.5 91.6 94.1 95.8  --   PLT 157  --  137* 148* 132* 114*  --    < > = values in this interval not displayed.    Basic Metabolic Panel: Recent Labs  Lab 04/22/21 0912 04/23/21 0336 04/24/21 1051 04/25/21 0500 04/25/21 0626  NA 138 139 142 140 141  K 3.8 3.6 3.6 3.6 3.6  CL 93* 92* 94* 90*  --   CO2 38* 36* 38* 42*  --   GLUCOSE 132* 164* 165* 119*  --   BUN 14 16 19 20   --   CREATININE 0.47 0.74 0.74  0.64  --   CALCIUM 8.3* 8.6* 8.6* 8.6*  --   MG 1.9 1.9 2.1 2.2  --   PHOS 2.5 3.5 4.5 3.8  --     GFR: Estimated Creatinine Clearance: 74.1 mL/min (by C-G formula based on SCr of 0.64 mg/dL). Liver Function Tests: Recent Labs  Lab 04/22/21 0912 04/23/21 0336 04/24/21 1051 04/25/21 0500  AST 8* 8* 9* 8*  ALT 7 7 9 8   ALKPHOS 33* 33* 30* 28*  BILITOT 0.8 0.3 1.1 0.9  PROT 5.0* 5.3* 5.4* 5.0*  ALBUMIN 2.2* 2.3* 2.3* 2.2*    No results for input(s): LIPASE, AMYLASE in the last 168 hours. Recent Labs  Lab 04/22/21 1658 04/23/21 0851  AMMONIA 101* 23    Coagulation Profile: No results for input(s): INR, PROTIME in the last 168 hours. Cardiac Enzymes: No results for input(s): CKTOTAL, CKMB, CKMBINDEX, TROPONINI in the last 168 hours. BNP (last 3 results) No results for input(s): PROBNP in the last 8760 hours. HbA1C: No results for input(s): HGBA1C in the last 72 hours. CBG: Recent Labs  Lab 04/24/21 2110 04/25/21 0021 04/25/21 0549 04/25/21 0808 04/25/21 1114  GLUCAP 195* 133* 132* 132* 114*   Lipid Profile: No results for input(s): CHOL, HDL, LDLCALC, TRIG, CHOLHDL, LDLDIRECT in the last 72 hours. Thyroid Function Tests: No results for input(s): TSH, T4TOTAL, FREET4, T3FREE, THYROIDAB in the last 72 hours. Anemia Panel: No results for input(s): VITAMINB12, FOLATE, FERRITIN, TIBC, IRON, RETICCTPCT in the last 72 hours. Sepsis Labs: Recent Labs  Lab 04/25/21 0505  PROCALCITON <0.10    Recent Results (from the past 240 hour(s))  Resp Panel by RT-PCR (Flu A&B, Covid) Nasopharyngeal Swab     Status: None   Collection Time: 04/18/21 12:18 PM   Specimen: Nasopharyngeal Swab; Nasopharyngeal(NP) swabs in vial transport medium  Result Value Ref Range Status   SARS Coronavirus 2 by RT PCR NEGATIVE NEGATIVE Final    Comment: (NOTE) SARS-CoV-2 target nucleic acids are NOT DETECTED.  The SARS-CoV-2 RNA is generally detectable in upper respiratory specimens during  the acute phase of infection. The lowest  concentration of SARS-CoV-2 viral copies this assay can detect is 138 copies/mL. A negative result does not preclude SARS-Cov-2 infection and should not be used as the sole basis for treatment or other patient management decisions. A negative result may occur with  improper specimen collection/handling, submission of specimen other than nasopharyngeal swab, presence of viral mutation(s) within the areas targeted by this assay, and inadequate number of viral copies(<138 copies/mL). A negative result must be combined with clinical observations, patient history, and epidemiological information. The expected result is Negative.  Fact Sheet for Patients:  BloggerCourse.com  Fact Sheet for Healthcare Providers:  SeriousBroker.it  This test is no t yet approved or cleared by the [place] FDA and  has been authorized for detection and/or diagnosis of SARS-CoV-2 by FDA under an Emergency Use Authorization (EUA). This EUA will remain  in effect (meaning this test can be used) for the duration of the COVID-19 declaration under Section 564(b)(1) of the Act, 21 U.S.C.section 360bbb-3(b)(1), unless the authorization is terminated  or revoked sooner.       Influenza A by PCR NEGATIVE NEGATIVE Final   Influenza B by PCR NEGATIVE NEGATIVE Final    Comment: (NOTE) The Xpert Xpress SARS-CoV-2/FLU/RSV plus assay is intended as an aid in the diagnosis of influenza from Nasopharyngeal swab specimens and should not be used as a sole basis for treatment. Nasal washings and aspirates are unacceptable for Xpert Xpress SARS-CoV-2/FLU/RSV testing.  Fact Sheet for Patients: BloggerCourse.com  Fact Sheet for Healthcare Providers: SeriousBroker.it  This test is not yet approved or cleared by the [place] FDA and has been authorized for detection and/or  diagnosis of SARS-CoV-2 by FDA under an Emergency Use Authorization (EUA). This EUA will remain in effect (meaning this test can be used) for the duration of the COVID-19 declaration under Section 564(b)(1) of the Act, 21 U.S.C. section 360bbb-3(b)(1), unless the authorization is terminated or revoked.  Performed at [facility], [address], [place], [place] [address]   Resp Panel by RT-PCR (Flu A&B, Covid) Nasopharyngeal Swab     Status: None   Collection Time: 04/21/21 10:18 AM   Specimen: Nasopharyngeal Swab; Nasopharyngeal(NP) swabs in vial transport medium  Result Value Ref Range Status   SARS Coronavirus 2 by RT PCR NEGATIVE NEGATIVE Final    Comment: (NOTE) SARS-CoV-2 target nucleic acids are NOT DETECTED.  The SARS-CoV-2 RNA is generally detectable in upper respiratory specimens during the acute phase of infection. The lowest concentration of SARS-CoV-2 viral copies this assay can detect is 138 copies/mL. A negative result does not preclude SARS-Cov-2 infection and should not be used as the sole basis for treatment or other patient management decisions. A negative result may occur with  improper specimen collection/handling, submission of specimen other than nasopharyngeal swab, presence of viral mutation(s) within the areas targeted by this assay, and inadequate number of viral copies(<138 copies/mL). A negative result must be combined with clinical observations, patient history, and epidemiological information. The expected result is Negative.  Fact Sheet for Patients:  BloggerCourse.com  Fact Sheet for Healthcare Providers:  SeriousBroker.it  This test is no t yet approved or cleared by the [place] FDA and  has been authorized for detection and/or diagnosis of SARS-CoV-2 by FDA under an Emergency Use Authorization (EUA). This EUA will remain  in effect (meaning this test can be used) for the duration  of the COVID-19 declaration under Section 564(b)(1) of the Act, 21 U.S.C.section 360bbb-3(b)(1), unless the authorization is terminated  or revoked sooner.       Influenza A by PCR NEGATIVE NEGATIVE Final   Influenza B by PCR NEGATIVE NEGATIVE Final    Comment: (NOTE) The Xpert Xpress SARS-CoV-2/FLU/RSV plus assay is intended as an aid in the diagnosis of influenza from Nasopharyngeal swab specimens and should not be used as a sole basis for treatment. Nasal washings and aspirates are unacceptable for Xpert Xpress SARS-CoV-2/FLU/RSV testing.  Fact Sheet for Patients: BloggerCourse.com  Fact Sheet for Healthcare Providers: SeriousBroker.it  This test is not yet approved or cleared by the [place] FDA and has been authorized for detection and/or diagnosis of SARS-CoV-2 by FDA under an Emergency Use Authorization (EUA). This EUA will remain in effect (meaning this test can be used) for the duration of the COVID-19 declaration under Section 564(b)(1) of the Act, 21 U.S.C. section 360bbb-3(b)(1), unless the authorization is terminated or revoked.  Performed at [facility], [address] [place], [place] [address]   Culture, blood (routine x 2)     Status: None (Preliminary result)   Collection Time: 04/22/21  4:58 PM   Specimen: BLOOD RIGHT HAND  Result Value Ref Range Status   Specimen Description BLOOD RIGHT HAND  Final   Special Requests   Final    BOTTLES DRAWN AEROBIC ONLY Blood Culture adequate volume   Culture   Final    NO GROWTH 3 DAYS Performed at [facility], [address], [place], [place] [address]    Report Status PENDING  Incomplete  Culture, blood (routine x 2)     Status: None (Preliminary result)   Collection Time: 04/22/21  5:07 PM   Specimen: BLOOD  Result Value Ref Range Status   Specimen Description BLOOD LEFT ANTECUBITAL  Final   Special Requests   Final    BOTTLES DRAWN AEROBIC  ONLY Blood Culture adequate volume   Culture   Final    NO GROWTH 3 DAYS Performed at [facility], [address], [place], [place] [address]    Report Status PENDING  Incomplete  MRSA Next Gen by PCR, Nasal     Status: Abnormal   Collection Time: 04/25/21  5:54 AM   Specimen: Nasal Mucosa; Nasal Swab  Result Value Ref Range Status   MRSA by PCR Next Gen DETECTED (A) NOT DETECTED Final    Comment: RESULT CALLED TO, READ BACK BY AND VERIFIED WITH: RN MICHELLE TOLLER 04/25/21@7 :05 BY TW (NOTE) The GeneXpert MRSA Assay (FDA approved for NASAL specimens only), is one component of a comprehensive MRSA colonization surveillance program. It is not intended to diagnose MRSA infection nor to guide or monitor treatment for MRSA infections. Test performance is not FDA approved in patients less than 29 years old. Performed at [facility], [address] [place], [place] [address]      RN Pressure Injury Documentation: Pressure Injury 04/21/21 Sacrum Medial Stage 2 -  Partial thickness loss of dermis presenting as a shallow open injury with a red, pink wound bed without slough. yellow, pink, craterous  04/25/21; updated staging, healed, samll opening (Active)  04/21/21 1720  Location: Sacrum  Location Orientation: Medial  Staging: Stage 2 -  Partial thickness loss of dermis presenting as a shallow open injury with a red, pink wound bed without slough.  Wound Description (Comments): yellow, pink, craterous  04/25/21; updated staging, healed, samll opening  Present on Admission: Yes     Pressure Injury 04/24/21 Lumbar Right Deep Tissue Pressure  Injury - Purple or maroon localized area of discolored intact skin or blood-filled blister due to damage of underlying soft tissue from pressure and/or shear. (Active)  04/24/21 2000  Location: Lumbar  Location Orientation: Right  Staging: Deep Tissue Pressure Injury - Purple or maroon localized area of discolored intact skin or blood-filled  blister due to damage of underlying soft tissue from pressure and/or shear.  Wound Description (Comments):   Present on Admission:      Estimated body mass index is 35.01 kg/m as calculated from the following:   Height as of this encounter: 5\' 6"  (1.676 m).   Weight as of this encounter: 98.4 kg.  Malnutrition Type:  Nutrition Problem: Increased nutrient needs Etiology: wound healing  Malnutrition Characteristics:  Signs/Symptoms: estimated needs  Nutrition Interventions:  Interventions: MVI, Ensure Enlive (each supplement provides 350kcal and 20 grams of protein)   Radiology Studies: DG CHEST PORT 1 VIEW  Result Date: 04/25/2021 CLINICAL DATA:  Shortness of breath. EXAM: PORTABLE CHEST 1 VIEW COMPARISON:  April 23, 2021. FINDINGS: Stable cardiomediastinal silhouette. No pneumothorax is noted. Left lung is clear. Stable elevated right hemidiaphragm is noted with right basilar atelectasis or infiltrate and associated pleural effusion. Bony thorax is unremarkable. IMPRESSION: Stable elevated right hemidiaphragm is noted with right basilar atelectasis or infiltrate and associated pleural effusion. Electronically Signed   By: April 25, 2021 M.D.   On: 04/25/2021 09:37    Scheduled Meds:  Chlorhexidine Gluconate Cloth  6 each Topical Q0600   divalproex  750 mg Oral Q12H   enoxaparin (LOVENOX) injection  95 mg Subcutaneous Q12H   insulin aspart  0-15 Units Subcutaneous Q4H   mouth rinse  15 mL Mouth Rinse BID   mupirocin ointment  1 application Nasal BID   pantoprazole (PROTONIX) IV  40 mg Intravenous Q24H   polyethylene glycol  17 g Oral Daily   risperiDONE  2 mg Oral BID   Continuous Infusions:  sodium chloride 10 mL/hr at 04/25/21 1600   norepinephrine (LEVOPHED) Adult infusion 3 mcg/min (04/25/21 1600)    LOS: 4 days   04/27/21, DO [facility] PAGER is on AMION  If 7PM-7AM, please contact night-coverage [email or web address]

## 2021-04-25 NOTE — Significant Event (Signed)
Rapid Response Event Note   Reason for Call : Hypercapnic respiratory failure with Decreased LOC and hypotension  Initial Focused Assessment:  I was notified by Camelia Eng RRT regarding concern for Ms. Mccorkle's decreased LOC and hypotension despite BIPAP/IVF. Pt is briefly arousable to noxious stimuli. BBS Clear, diminished on BIPAP 16/8 at 40% FIO2. Skin is warm pink and diaphoretic. She has intermittent issues with hypotension overnight with BPs in the 70s. 1L NS total given overnight but BPs back into the 70s (72/46 verified both arms). Levophed gtt initiated per order and pt transferred to 2M07 with Selenia RN, and Tracey RRT.   0530-97.9 F, HR 92 afib, 86/59 (65), RR 15 with sats 97% on 40% Fio2   Interventions:  -Levophed gtt  -tx 2M07    MD Notified: Dr. Rachael Darby Call Time: 0500 Arrival Time: 0505 End Time: 0600  Rose Fillers, RN

## 2021-04-25 NOTE — Progress Notes (Signed)
Patient transferred to 2M07 via BIPAP without incidence. Report given to receiving RT.

## 2021-04-25 NOTE — Consult Note (Addendum)
NAME:  Becky Hendricks, MRN:  [id number], DOB:  March 31, 1946, LOS: 4 ADMISSION DATE:  04/21/2021, CONSULTATION DATE: 04/26/2019 REFERRING MD: [facility] hospitalist team, CHIEF COMPLAINT: Hypotension hypoxia and hypercarbia complicated by altered mental status  History of Present Illness:  75 year old nursing home patient has had multiple admissions and required intubation for altered mental status hypercarbic respiratory failure with noncompliance of noninvasive mechanical ventilatory support.  She has a known bipolar disorder which interferes with her compliance issues initially admitted on 04/21/2021 for altered mental status was on the floor on 04/24/2021 noted to be in atrial fibrillation trickle response was placed on Cardizem drip which subsequently led to hypotension and due to the fact her mental status continued to decline she is moved to the intensive care unit for peripheral vasopressors with Levophed.  Pulmonary critical care was asked to see and assume her care on 04/25/2021 she is noted to be in respiratory distress poorly responsive and hypotensive and currently on Levophed drip.  She may need intubation and central line placement in the near future.  Pertinent  Medical History   Past Medical History:  Diagnosis Date   Atrial fibrillation (HCC)    Bipolar disorder (HCC)    Bipolar disorder (HCC) 04/27/2020   Clostridium difficile diarrhea 08/02/2019   Dehydration    Essential hypertension    Fall 02/11/2020   Morbid obesity (HCC)    Osteomyelitis (HCC) 12/03/2019   Pneumonia due to COVID-19 virus 09/15/2019   Type 2 diabetes mellitus (HCC)    Weakness      Significant Hospital Events: Including procedures, antibiotic start and stop dates in addition to other pertinent events   04/25/2021 transferred to intensive care unit for hypotension and decreased level of consciousness  Interim History / Subjective:  75 year old female admitted from nursing home with hypotension hypoxia in the setting  of noncompliance with OSA treatment of CPAP.  She has been intubated twice last month she has history of COVID 19.  Objective   Blood pressure (!) 95/56, pulse 95, temperature 98.1 F (36.7 C), temperature source Axillary, resp. rate 14, height 5\' 6"  (1.676 m), weight 98.4 kg, SpO2 99 %.    FiO2 (%):  [40 %] 40 %   Intake/Output Summary (Last 24 hours) at 04/25/2021 0915 Last data filed at 04/25/2021 0800 Gross per 24 hour  Intake 1277 ml  Output 2550 ml  Net -1273 ml   Filed Weights   04/25/21 0550  Weight: 98.4 kg    Examination: General: Poorly responsive morbidly obese female who is currently on noninvasive mechanical ventilatory support HENT: No JVD or lymphadenopathy is appreciated Lungs: Mild rhonchi diminished in the base Cardiovascular: Heart sounds are irregular Abdomen: Obese soft positive bowel sounds Extremities: 2+ lower extremity edema, left forearm area of inflammation noted Neuro: Response to noxious stimuli by lifting arms. 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  Noted to have recent admission x2 that required intubation for similar symptoms. Currently on noninvasive mechanical ventilatory support she may need intubation in the near future Adjustments to maximize ventilator mechanics to blow off CO2 Altered mental status is a hindrance to effective ventilation. Noted to have right pleural effusion and some airspace disease on the right.  Hypotension in the setting of having been placed on a Cardizem drip for atrial fibrillation rapid ventricular response .  With subsequent hypotension with Cardizem stopped and placed on low-dose Levophed peripherally.  Known history of congestive heart failure. Cardizem discontinued Low-dose peripheral Levophed Consider central line placement for continued  Levophed administration and monitor Central venous pressure. Hold anticoagulation for now  Bipolar disease Hold psychotropic medications  History of COVID-19 12/03/2019 Continue to follow chest x-ray This may be partially responsible continued respiratory failure  Metabolic encephalopathy Hold any sedating medications CT head 8/29 no acute issuses Reportedly sacral wound is healed  Best Practice (right click and "Reselect all SmartList Selections" daily)   Diet/type: NPO DVT prophylaxis: DOAC GI prophylaxis: N/A Lines: N/ATBD Foley:  N/A Code Status:  full code Last date of multidisciplinary goals of care discussion 04/25/2021 spoke to her [name] and confirmed with him that she is a full code and she needs to be intubated and central line placed this is his wishes that we continue with all interventions needed. Labs   CBC: Recent Labs  Lab 04/21/21 0944 04/21/21 0956 04/22/21 0912 04/23/21 0336 04/24/21 1051 04/25/21 0500 04/25/21 0626  WBC 6.6  --  6.6 7.1 8.7 8.0  --   NEUTROABS  --   --  3.6 3.5 6.1 4.7  --   HGB 9.9*   < > 9.7* 10.5* 11.1* 10.1* 10.9*  HCT 34.1*   < > 31.9* 33.6* 36.9 34.3* 32.0*  MCV 97.2  --  92.5 91.6 94.1 95.8  --   PLT 157  --  137* 148* 132* 114*  --    < > = values in this interval not displayed.    Basic Metabolic Panel: Recent Labs  Lab 04/22/21 0912 04/23/21 0336 04/24/21 1051 04/25/21 0500 04/25/21 0626  NA 138 139 142 140 141  K 3.8 3.6 3.6 3.6 3.6  CL 93* 92* 94* 90*  --   CO2 38* 36* 38* 42*  --   GLUCOSE 132* 164* 165* 119*  --   BUN 14 16 19 20   --   CREATININE 0.47 0.74 0.74 0.64  --   CALCIUM 8.3* 8.6* 8.6* 8.6*  --   MG 1.9 1.9 2.1 2.2  --   PHOS 2.5 3.5 4.5 3.8  --    GFR: Estimated Creatinine Clearance: 74.1 mL/min (by C-G formula based on SCr of 0.64 mg/dL). Recent Labs  Lab 04/22/21 0912 04/23/21 0336 04/24/21 1051 04/25/21 0500 04/25/21 0505  PROCALCITON  --   --   --   --  <0.10  WBC  6.6 7.1 8.7 8.0  --     Liver Function Tests: Recent Labs  Lab 04/22/21 0912 04/23/21 0336 04/24/21 1051 04/25/21 0500  AST 8* 8* 9* 8*  ALT 7 7 9 8   ALKPHOS 33* 33* 30* 28*  BILITOT 0.8 0.3 1.1 0.9  PROT 5.0* 5.3* 5.4* 5.0*  ALBUMIN 2.2* 2.3* 2.3* 2.2*   No results for input(s): LIPASE, AMYLASE in the last 168 hours. Recent Labs  Lab 04/22/21 1658 04/23/21 0851  AMMONIA 101* 23    ABG    Component Value Date/Time   PHART 7.406 04/25/2021 0626   PCO2ART 74.7 (HH) 04/25/2021 0626   PO2ART 80 (L) 04/25/2021 0626   HCO3 46.8 (H) 04/25/2021 0626   TCO2 49 (H) 04/25/2021 0626   O2SAT 95.0 04/25/2021 0626     Coagulation Profile: No results for input(s): INR, PROTIME in the last 168 hours.  Cardiac Enzymes: No results for input(s): CKTOTAL, CKMB, CKMBINDEX, TROPONINI in the last 168 hours.  HbA1C: Hgb A1c  MFr Bld  Date/Time Value Ref Range Status  03/30/2021 03:11 AM 7.0 (H) 4.8 - 5.6 % Final    Comment:    (NOTE) Pre diabetes:          5.7%-6.4%  Diabetes:              >6.4%  Glycemic control for   <7.0% adults with diabetes   11/18/2020 01:54 AM 6.1 (H) 4.8 - 5.6 % Final    Comment:    (NOTE) Pre diabetes:          5.7%-6.4%  Diabetes:              >6.4%  Glycemic control for   <7.0% adults with diabetes     CBG: Recent Labs  Lab 04/24/21 1618 04/24/21 2110 04/25/21 0021 04/25/21 0549 04/25/21 0808  GLUCAP 116* 195* 133* 132* 132*    Review of Systems:   NA  Past Medical History:  She,  has a past medical history of Atrial fibrillation (HCC), Bipolar disorder (HCC), Bipolar disorder (HCC) (04/27/2020), Clostridium difficile diarrhea (08/02/2019), Dehydration, Essential hypertension, Fall (02/11/2020), Morbid obesity (HCC), Osteomyelitis (HCC) (12/03/2019), Pneumonia due to COVID-19 virus (09/15/2019), Type 2 diabetes mellitus (HCC), and Weakness.   Surgical History:   Past Surgical History:  Procedure Laterality Date   INCISION AND  DRAINAGE PERIRECTAL ABSCESS Left 12/04/2019   Procedure: IRRIGATION AND DEBRIDEMENT LEFT BUTTOCK ABSCESS;  Surgeon: Violeta Gelinas, MD;  Location: [facility] OR;  Service: General;  Laterality: Left;   INCISION AND DRAINAGE PERIRECTAL ABSCESS Left 12/10/2019   Procedure: REPEAT IRRIGATION AND DEBRIDEMENT BUTTOCK  ABSCESS;  Surgeon: Abigail Miyamoto, MD;  Location: [facility] OR;  Service: General;  Laterality: Left;   IR FLUORO GUIDE CV LINE RIGHT  11/25/2020   IR RADIOLOGIST EVAL & MGMT  12/29/2020   IR US GUIDE VASC ACCESS RIGHT  11/25/2020     Social History:   reports that she has never smoked. She has never used smokeless tobacco. She reports that she does not currently use alcohol. She reports that she does not currently use drugs.   Family History:  Her Family history is unknown by patient.   Allergies Allergies  Allergen Reactions   Chlorhexidine Gluconate Itching   Vancomycin     Vancomycin infusion related reaction- May need Vanc to be given at a slower rate    Acetazolamide Er Rash     Home Medications  Prior to Admission medications   Medication Sig Start Date End Date Taking? Authorizing Provider  apixaban (ELIQUIS) 5 MG TABS tablet Take 5 mg by mouth 2 (two) times daily.   Yes [provider]  atorvastatin (LIPITOR) 10 MG tablet Take 10 mg by mouth at bedtime.   Yes [provider]  calcium carbonate (TUMS - DOSED IN MG ELEMENTAL CALCIUM) 500 MG chewable tablet Chew 500 mg by mouth daily.   Yes [provider]  diphenhydrAMINE (BENADRYL) 2 % cream Apply 1 application topically 3 (three) times daily as needed for itching.   Yes [provider]  divalproex (DEPAKOTE) 250 MG DR tablet Take 3 tablets (750 mg total) by mouth 2 (two) times daily. 04/19/21  Yes Esaw Grandchild A, DO  docusate sodium (COLACE) 100 MG capsule Take 1 capsule (100 mg total) by mouth 2 (two) times daily as needed for mild constipation. 01/05/20  Yes Regalado, Belkys A, MD   empagliflozin (JARDIANCE) 10 MG TABS tablet Take 10 mg by mouth daily.   Yes [provider]  ferrous  sulfate 324 MG TBEC Take 324 mg by mouth daily.   Yes [provider]  insulin lispro (HUMALOG) 100 UNIT/ML injection Inject 2-12 Units into the skin in the morning and at bedtime. CBG 70 - 200: 0 units CBG 201 - 250: 2 units CBG 251 - 300: 4 units CBG 301 - 350: 6 units CBG 351 - 400: 8 units CBG 401 - 450: 10 units CBG 451 - 600: 12 units If CBG is > 450, give 12 units, recheck in 2 hours. If still >350, notify provider   Yes [provider]  Ipratropium-Albuterol (COMBIVENT RESPIMAT) 20-100 MCG/ACT AERS respimat Inhale 1 puff into the lungs every 12 (twelve) hours.   Yes [provider]  JANUVIA 50 MG tablet Take 50 mg by mouth daily. 03/27/21  Yes [provider]  loperamide (IMODIUM) 2 MG capsule Take 1 capsule (2 mg total) by mouth as needed for diarrhea or loose stools. 04/19/21  Yes Esaw Grandchild A, DO  metoprolol tartrate (LOPRESSOR) 25 MG tablet Take 0.5 tablets (12.5 mg total) by mouth 2 (two) times daily. Hold if SBP<110 or DBP<60 04/19/21  Yes Esaw Grandchild A, DO  Multiple Vitamin (MULTIVITAMIN WITH MINERALS) TABS tablet Take 1 tablet by mouth daily.   Yes [provider]  pantoprazole (PROTONIX) 40 MG tablet Take 1 tablet (40 mg total) by mouth daily. 04/07/21  Yes Glade Lloyd, MD  polyethylene glycol (MIRALAX / GLYCOLAX) 17 g packet Take 17 g by mouth daily. 04/07/21  Yes Glade Lloyd, MD  risperiDONE (RISPERDAL M-TABS) 1 MG disintegrating tablet Take 1 tablet (1 mg total) by mouth daily. 04/19/21  Yes Esaw Grandchild A, DO  risperiDONE (RISPERDAL M-TABS) 2 MG disintegrating tablet Take 1 tablet (2 mg total) by mouth 2 (two) times daily. 04/19/21  Yes Esaw Grandchild A, DO  sennosides-docusate sodium (SENOKOT-S) 8.6-50 MG tablet Take 2 tablets by mouth daily. Hold if having loose or frequent stools 04/19/21  Yes Esaw Grandchild A,  DO  spironolactone (ALDACTONE) 25 MG tablet Take 25 mg by mouth every other day.   Yes [provider]  tamsulosin (FLOMAX) 0.4 MG CAPS capsule Take 1 capsule (0.4 mg total) by mouth every other day. 04/20/21  Yes Pennie Banter, DO     Critical care time: 45 min   Brett Canales Benita Boonstra ACNP Acute Care Nurse Practitioner [facility] Please consult Amion 04/25/2021, 9:15 AM

## 2021-04-25 NOTE — Progress Notes (Signed)
ANTICOAGULATION CONSULT NOTE   Pharmacy Consult for Lovenox (while Eliquis is on hold) Indication: atrial fibrillation  Allergies  Allergen Reactions   Chlorhexidine Gluconate Itching   Vancomycin     Vancomycin infusion related reaction- May need Vanc to be given at a slower rate    Acetazolamide Er Rash    Patient Measurements: Height: 5\' 6"  (167.6 cm) Weight: 98.4 kg (216 lb 14.9 oz) IBW/kg (Calculated) : 59.3  Vital Signs: Temp: 98.8 F (37.1 C) (09/13 1115) Temp Source: Axillary (09/13 1115) BP: 103/67 (09/13 1015) Pulse Rate: 105 (09/13 1126)  Labs: Recent Labs    04/23/21 0336 04/24/21 1051 04/25/21 0500 04/25/21 0626  HGB 10.5* 11.1* 10.1* 10.9*  HCT 33.6* 36.9 34.3* 32.0*  PLT 148* 132* 114*  --   CREATININE 0.74 0.74 0.64  --     Estimated Creatinine Clearance: 74.1 mL/min (by C-G formula based on SCr of 0.64 mg/dL).   Medications:  Scheduled:   Chlorhexidine Gluconate Cloth  6 each Topical Q0600   enoxaparin (LOVENOX) injection  95 mg Subcutaneous Q12H   insulin aspart  0-15 Units Subcutaneous Q4H   mouth rinse  15 mL Mouth Rinse BID   mupirocin ointment  1 application Nasal BID   pantoprazole (PROTONIX) IV  40 mg Intravenous Q24H   polyethylene glycol  17 g Oral Daily   risperiDONE  2 mg Oral BID   white petrolatum        Assessment: 75 yo female admitted to the ICU on 9/13 with hypercapnic respiratory respiratory failure, after being unresponsive and hypotension overnight.   Patient has a history of Afib (on Eliquis PTA).  Last dose of Eliquis given on 9/12 @ 2132.  Given patient is on BiPAP and not reliably taking oral medications, will transition to anticoagulation with lovenox, per MD.   Goal of Therapy:  Monitor platelets by anticoagulation protocol: Yes   Plan:  Lovenox 95 mg SQ every 12 hours. Monitor CBC, and for signs/symptoms of bleeding. F/u long-term anticoagulation plan.    2133, PharmD PGY1 Pharmacy Resident Phone  [phone] 04/25/2021 12:12 PM   Please check AMION for all [facility] Pharmacy phone numbers After 10:00 PM, call Main Pharmacy [phone]

## 2021-04-25 NOTE — Progress Notes (Addendum)
Patient received with soft BPs at start of shift. Pt on cardizem gtt and it was titrated off with no response in BP improvement.   2048: Cardizem gtt off.  2052: Pt O x 3 and denies dizziness or feeling lightheaded. MD Chotiner called re: hypotension.   2103:Orders given for LR bolus to transfuse over 2 hours. Order administered.  0019: RASS -4, awakens to sternal rub. Unable to follow commands or respond to questions. Transient stabilization in BP with fluid bolus but hypotension is ongoing. MD Chotiner paged.   89: New order for LR bolus and VBG. Orders completed.  0119: Critical result for CO2. MD Chotiner notified. RT Terri notified.  0135: Pt placed on bipap.  0434: Transient improvement in BP. Ongoing hypotension. MD Chotiner paged. Orders for pressors and transfer to ICU.

## 2021-04-26 ENCOUNTER — Inpatient Hospital Stay (HOSPITAL_COMMUNITY): Payer: Medicare Other

## 2021-04-26 ENCOUNTER — Inpatient Hospital Stay: Payer: Medicare Other | Admitting: Pulmonary Disease

## 2021-04-26 DIAGNOSIS — J9622 Acute and chronic respiratory failure with hypercapnia: Secondary | ICD-10-CM | POA: Diagnosis not present

## 2021-04-26 LAB — POCT I-STAT 7, (LYTES, BLD GAS, ICA,H+H)
Acid-Base Excess: 19 mmol/L — ABNORMAL HIGH (ref 0.0–2.0)
Bicarbonate: 47.1 mmol/L — ABNORMAL HIGH (ref 20.0–28.0)
Calcium, Ion: 1.16 mmol/L (ref 1.15–1.40)
HCT: 32 % — ABNORMAL LOW (ref 36.0–46.0)
Hemoglobin: 10.9 g/dL — ABNORMAL LOW (ref 12.0–15.0)
O2 Saturation: 97 %
Potassium: 3.7 mmol/L (ref 3.5–5.1)
Sodium: 137 mmol/L (ref 135–145)
TCO2: 49 mmol/L — ABNORMAL HIGH (ref 22–32)
pCO2 arterial: 76.3 mmHg (ref 32.0–48.0)
pH, Arterial: 7.399 (ref 7.350–7.450)
pO2, Arterial: 92 mmHg (ref 83.0–108.0)

## 2021-04-26 LAB — GLUCOSE, CAPILLARY
Glucose-Capillary: 287 mg/dL — ABNORMAL HIGH (ref 70–99)
Glucose-Capillary: 168 mg/dL — ABNORMAL HIGH (ref 70–99)
Glucose-Capillary: 164 mg/dL — ABNORMAL HIGH (ref 70–99)
Glucose-Capillary: 181 mg/dL — ABNORMAL HIGH (ref 70–99)
Glucose-Capillary: 189 mg/dL — ABNORMAL HIGH (ref 70–99)
Glucose-Capillary: 152 mg/dL — ABNORMAL HIGH (ref 70–99)

## 2021-04-26 LAB — COMPREHENSIVE METABOLIC PANEL
ALT: 11 U/L (ref 0–44)
AST: 10 U/L — ABNORMAL LOW (ref 15–41)
Albumin: 2.2 g/dL — ABNORMAL LOW (ref 3.5–5.0)
Alkaline Phosphatase: 31 U/L — ABNORMAL LOW (ref 38–126)
Anion gap: 10 (ref 5–15)
BUN: 21 mg/dL (ref 8–23)
CO2: 40 mmol/L — ABNORMAL HIGH (ref 22–32)
Calcium: 8.5 mg/dL — ABNORMAL LOW (ref 8.9–10.3)
Chloride: 88 mmol/L — ABNORMAL LOW (ref 98–111)
Creatinine, Ser: 0.6 mg/dL (ref 0.44–1.00)
GFR, Estimated: >60 mL/min (ref >60)
Glucose, Bld: 199 mg/dL — ABNORMAL HIGH (ref 70–99)
Potassium: 3.2 mmol/L — ABNORMAL LOW (ref 3.5–5.1)
Sodium: 138 mmol/L (ref 135–145)
Total Bilirubin: 0.7 mg/dL (ref 0.3–1.2)
Total Protein: 5.3 g/dL — ABNORMAL LOW (ref 6.5–8.1)

## 2021-04-26 LAB — CBC WITH DIFFERENTIAL/PLATELET
Abs Immature Granulocytes: 0.07 10*3/uL (ref 0.00–0.07)
Basophils Absolute: 0 10*3/uL (ref 0.0–0.1)
Basophils Relative: 0 %
Eosinophils Absolute: 0.8 10*3/uL — ABNORMAL HIGH (ref 0.0–0.5)
Eosinophils Relative: 11 %
HCT: 36.8 % (ref 36.0–46.0)
Hemoglobin: 10.9 g/dL — ABNORMAL LOW (ref 12.0–15.0)
Immature Granulocytes: 1 %
Lymphocytes Relative: 24 %
Lymphs Abs: 1.7 10*3/uL (ref 0.7–4.0)
MCH: 27.9 pg (ref 26.0–34.0)
MCHC: 29.6 g/dL — ABNORMAL LOW (ref 30.0–36.0)
MCV: 94.4 fL (ref 80.0–100.0)
Monocytes Absolute: 0.8 10*3/uL (ref 0.1–1.0)
Monocytes Relative: 10 %
Neutro Abs: 4 10*3/uL (ref 1.7–7.7)
Neutrophils Relative %: 54 %
Platelets: 117 10*3/uL — ABNORMAL LOW (ref 150–400)
RBC: 3.9 MIL/uL (ref 3.87–5.11)
RDW: 14.7 % (ref 11.5–15.5)
WBC: 7.4 10*3/uL (ref 4.0–10.5)
nRBC: 0 % (ref 0.0–0.2)

## 2021-04-26 LAB — PHOSPHORUS: Phosphorus: 2.3 mg/dL — ABNORMAL LOW (ref 2.5–4.6)

## 2021-04-26 LAB — MAGNESIUM: Magnesium: 2.1 mg/dL (ref 1.7–2.4)

## 2021-04-26 IMAGING — DX DG CHEST 1V PORT
1 series · 1 of 1 positions shown · non-contrast
Comparison: [DATE]

CLINICAL DATA: Shortness of breath

EXAM:
PORTABLE CHEST 1 VIEW

[chest]
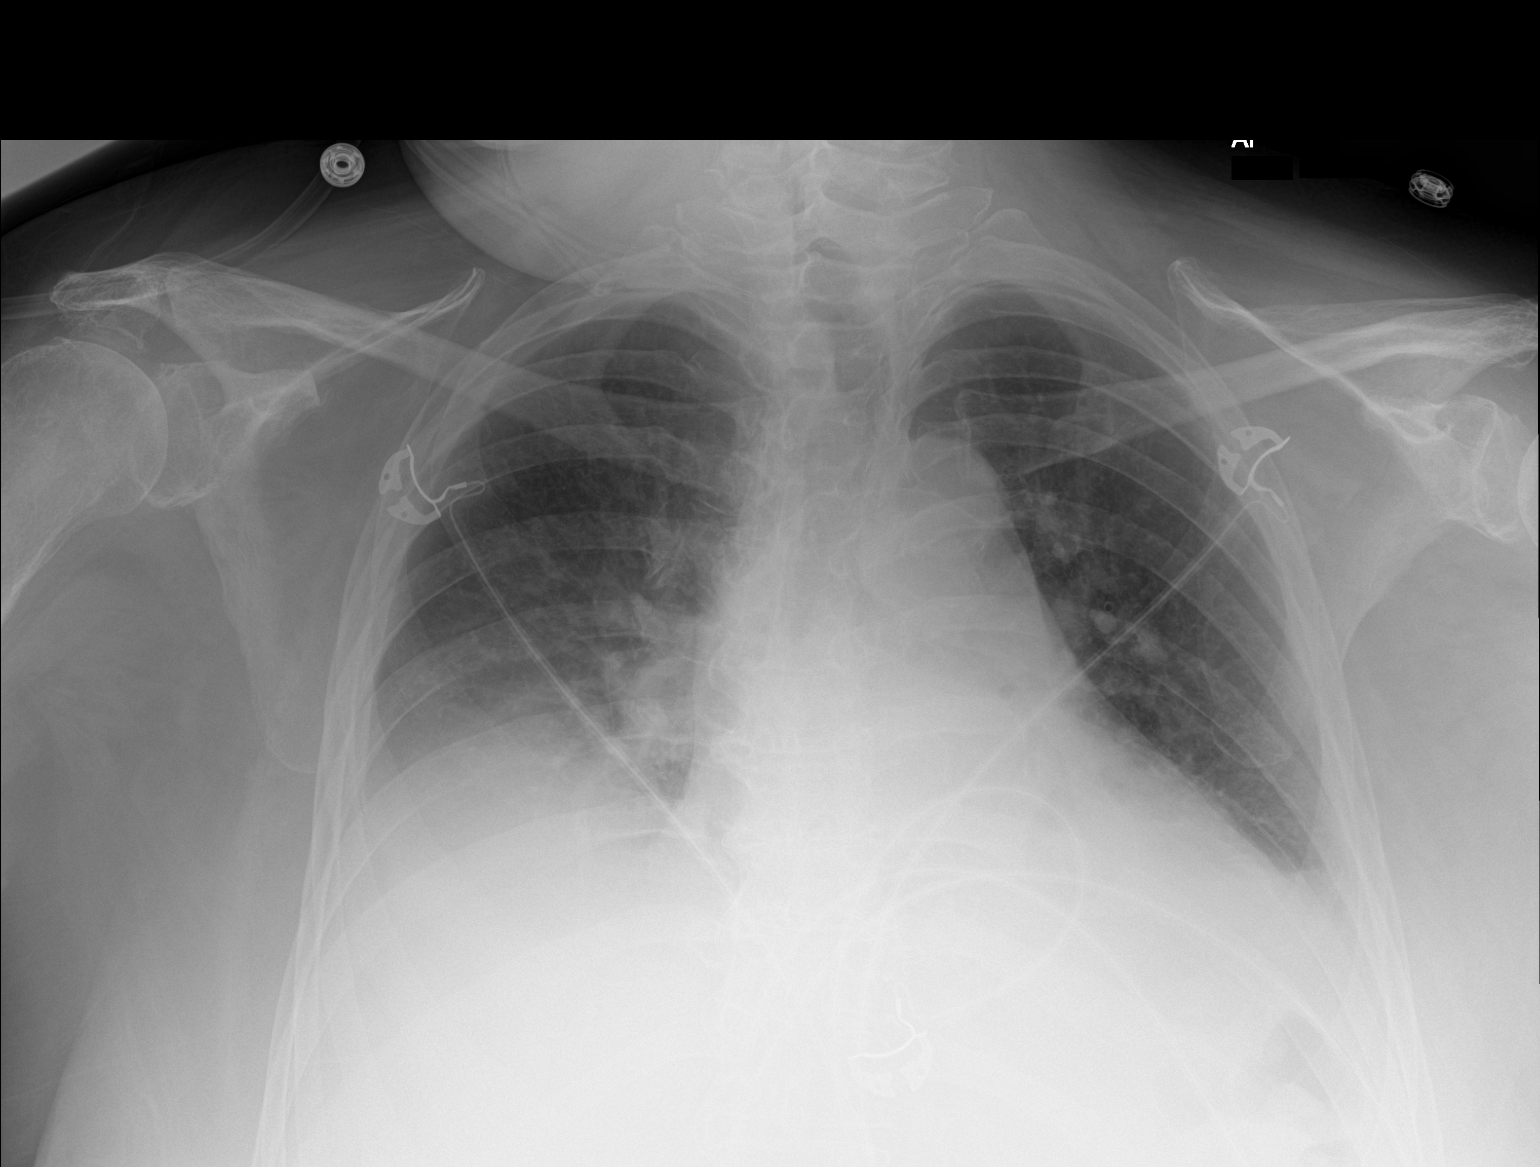

[1 of 1 positions shown; findings below may reference images not displayed]

FINDINGS: Heart is borderline in size. Bilateral lower lobe airspace opacities
and probable layering effusions. No acute bony abnormality.
IMPRESSION: Layering bilateral effusions with bibasilar atelectasis or
infiltrates. Worsening aeration at the left base since prior study.

## 2021-04-26 MED ORDER — POTASSIUM CHLORIDE 10 MEQ/100ML IV SOLN
10.0000 meq | INTRAVENOUS | Status: AC
  Administered 2021-04-26 (×4): 10 meq via INTRAVENOUS
  Filled 2021-04-26 (×4): qty 100

## 2021-04-26 MED ORDER — AMIODARONE HCL IN DEXTROSE 360-4.14 MG/200ML-% IV SOLN
30.0000 mg/h | INTRAVENOUS | Status: DC
  Administered 2021-04-27 – 2021-04-28 (×4): 30 mg/h via INTRAVENOUS
  Filled 2021-04-26 (×5): qty 200

## 2021-04-26 MED ORDER — AMIODARONE HCL IN DEXTROSE 360-4.14 MG/200ML-% IV SOLN
60.0000 mg/h | INTRAVENOUS | Status: AC
  Administered 2021-04-26 (×2): 60 mg/h via INTRAVENOUS
  Filled 2021-04-26: qty 200

## 2021-04-26 MED ORDER — POTASSIUM CHLORIDE CRYS ER 20 MEQ PO TBCR: 40.0000 meq | EXTENDED_RELEASE_TABLET | ORAL | Status: DC

## 2021-04-26 MED ORDER — LORAZEPAM 2 MG/ML IJ SOLN
1.0000 mg | Freq: Once | INTRAMUSCULAR | Status: AC
  Administered 2021-04-26: 1 mg via INTRAVENOUS
  Filled 2021-04-26: qty 1

## 2021-04-26 MED ORDER — AMIODARONE HCL 200 MG PO TABS: 400.0000 mg | ORAL_TABLET | Freq: Every day | ORAL | Status: DC

## 2021-04-26 MED ORDER — MIDODRINE HCL 5 MG PO TABS
10.0000 mg | ORAL_TABLET | Freq: Three times a day (TID) | ORAL | Status: DC
  Administered 2021-04-26 – 2021-05-03 (×21): 10 mg via ORAL
  Filled 2021-04-26 (×21): qty 2

## 2021-04-26 MED ORDER — POTASSIUM & SODIUM PHOSPHATES 280-160-250 MG PO PACK
1.0000 | PACK | Freq: Three times a day (TID) | ORAL | Status: AC
  Administered 2021-04-26 (×2): 1 via ORAL
  Filled 2021-04-26 (×2): qty 1

## 2021-04-26 MED ORDER — AMIODARONE HCL 200 MG PO TABS
400.0000 mg | ORAL_TABLET | Freq: Two times a day (BID) | ORAL | Status: DC
Start: 2021-04-27 — End: 2021-04-26

## 2021-04-26 MED ORDER — AMIODARONE LOAD VIA INFUSION
150.0000 mg | Freq: Once | INTRAVENOUS | Status: AC
  Administered 2021-04-26: 150 mg via INTRAVENOUS
  Filled 2021-04-26: qty 83.34

## 2021-04-26 MED ORDER — ENOXAPARIN SODIUM 100 MG/ML IJ SOSY
95.0000 mg | PREFILLED_SYRINGE | Freq: Two times a day (BID) | INTRAMUSCULAR | Status: DC
  Administered 2021-04-26 – 2021-04-28 (×5): 95 mg via SUBCUTANEOUS
  Filled 2021-04-26 (×6): qty 0.95

## 2021-04-26 MED ORDER — POTASSIUM CHLORIDE CRYS ER 20 MEQ PO TBCR
40.0000 meq | EXTENDED_RELEASE_TABLET | Freq: Once | ORAL | Status: AC
  Administered 2021-04-26: 40 meq via ORAL
  Filled 2021-04-26: qty 2

## 2021-04-26 NOTE — Progress Notes (Signed)
NAME:  Becky Hendricks, MRN:  [id number], DOB:  Jul 07, 1946, LOS: 5 ADMISSION DATE:  04/21/2021, CONSULTATION DATE: 04/26/2019 REFERRING MD: [facility] hospitalist team, CHIEF COMPLAINT: Hypotension hypoxia and hypercarbia complicated by altered mental status  History of Present Illness:  75 year old nursing home patient has had multiple admissions and required intubation for altered mental status hypercarbic respiratory failure with noncompliance of noninvasive mechanical ventilatory support.  She has a known bipolar disorder which interferes with her compliance issues initially admitted on 04/21/2021 for altered mental status was on the floor on 04/24/2021 noted to be in atrial fibrillation trickle response was placed on Cardizem drip which subsequently led to hypotension and due to the fact her mental status continued to decline she is moved to the intensive care unit for peripheral vasopressors with Levophed.  Pulmonary critical care was asked to see and assume her care on 04/25/2021 she is noted to be in respiratory distress poorly responsive and hypotensive and currently on Levophed drip.  She may need intubation and central line placement in the near future.  Pertinent  Medical History    has a past medical history of Atrial fibrillation (HCC), Bipolar disorder (HCC), Bipolar disorder (HCC) (04/27/2020), Clostridium difficile diarrhea (08/02/2019), Dehydration, Essential hypertension, Fall (02/11/2020), Morbid obesity (HCC), Osteomyelitis (HCC) (12/03/2019), Pneumonia due to COVID-19 virus (09/15/2019), Type 2 diabetes mellitus (HCC), and Weakness.   Significant Hospital Events: Including procedures, antibiotic start and stop dates in addition to other pertinent events   04/25/2021 transferred to intensive care unit for hypotension and decreased level of consciousness.  Mental status improved and did not require intubation   Interim History / Subjective:   Levophed dose increased yesterday.  Continues to  refuse BiPAP while she is awake but then need to put on it when she becomes somnolent When awake she has been yelling, rude to the staff  Objective   Blood pressure (!) 94/57, pulse (!) 103, temperature 98.8 F (37.1 C), temperature source Axillary, resp. rate (!) 22, height 5\' 6"  (1.676 m), weight 98.4 kg, SpO2 96 %.    FiO2 (%):  [40 %] 40 %   Intake/Output Summary (Last 24 hours) at 04/26/2021 0959 Last data filed at 04/26/2021 0700 Gross per 24 hour  Intake 994.23 ml  Output 1350 ml  Net -355.77 ml   Filed Weights   04/25/21 0550  Weight: 98.4 kg    Examination: Gen:      No acute distress HEENT:  EOMI, sclera anicteric Neck:     No masses; no thyromegaly Lungs:    Clear to auscultation bilaterally; normal respiratory effort CV:         Regular rate and rhythm; no murmurs Abd:      + bowel sounds; soft, non-tender; no palpable masses, no distension Ext:    No edema; adequate peripheral perfusion Skin:      Warm and dry; no rash Neuro: alert and oriented x 3  Labs/imaging reviewed Significant for potassium 3.7, BUN/creatinine 21/0.6 WBC 7.4, hemoglobin 10.9, platelets 117 Chest x-ray with small bilateral effusions, atelectasis   Resolved Hospital Problem list     Assessment & Plan:  Acute on chronic hypercarbic respiratory failure secondary to noncompliance in the setting of bipolar disease Has intermittently needed BiPAP but however refuses it when awake and alert No evidence of infection.  Follow intermittent chest x-ray   Hypotension after Cardizem drip was started. Holding Lasix diuresis Wean off Levophed Start oral midodrine Hold off antibiotics as suspicion for infection is low as  she is afebrile with normal WBC count   Bipolar disease Antipsychotics per psychiatry team Consider  reducing rispredal dose or give once/day to reduce sedating effects  History of COVID-19 12/03/2019 Continue to follow chest x-ray This may be partially responsible continued  respiratory failure  Best Practice (right click and "Reselect all SmartList Selections" daily)   Diet/type: NPO DVT prophylaxis: DOAC GI prophylaxis: N/A Lines: N/ATBD Foley:  N/A Code Status:  full code Last date of multidisciplinary goals of care discussion 04/25/2021 spoke to her [name] and confirmed with him that she is a full code and she needs to be intubated and central line placed this is his wishes that we continue with all interventions needed. However she has refused medical interventions including Bipap. Agree with palliative consult  Critical care time:    The patient is critically ill with multiple organ system failure and requires high complexity decision making for assessment and support, frequent evaluation and titration of therapies, advanced monitoring, review of radiographic studies and interpretation of complex data.   Critical Care Time devoted to patient care services, exclusive of separately billable procedures, described in this note is 35 minutes.   Chilton Greathouse MD [facility] See Amion for pager  If no response to pager , please call [phone] until 7pm After 7:00 pm call Elink  [phone] 04/26/2021, 10:33 AM

## 2021-04-26 NOTE — Consult Note (Signed)
Reason for Consult: Severe Hallucinations, Paranoia, Bipolar Disorder and labile mood Referring Physician: Merlene Laughter, DO  Becky Hendricks is an 75 y.o. female.  HPI:  Becky Hendricks is a 75 yr old female with a history of chronic hypoxic hypercapnic respiratory failure secondary to OSA, noncompliant with BiPAP at night, morbid obesity, IDDM, HTN, PAF on Eliquis, bipolar disorder, sent from nursing home for AMS. Recently discharged from the hospital on 8/26 and 9/7 after admissions stemming from acute hypoxic and hypercarbic respiratory failure due to her refusal to wear a BiPAP at night.   Attempted to interview patient multiple times today however, she has been asleep with her BiPAP mask on.  Given her usual reluctance to wear the BiPAP have not woken her up so that she can continue to receive proper treatment.  Per nurse the times patient has been awake she has been at her baseline and able to move all limbs with no new complaints.  Hopefully she will continue to wear her BiPAP as her hypercapnia respiratory failure is her biggest issue at this time.   Past Medical History:  Diagnosis Date   Atrial fibrillation (HCC)    Bipolar disorder (HCC)    Bipolar disorder (HCC) 04/27/2020   Clostridium difficile diarrhea 08/02/2019   Dehydration    Essential hypertension    Fall 02/11/2020   Morbid obesity (HCC)    Osteomyelitis (HCC) 12/03/2019   Pneumonia due to COVID-19 virus 09/15/2019   Type 2 diabetes mellitus (HCC)    Weakness     Past Surgical History:  Procedure Laterality Date   INCISION AND DRAINAGE PERIRECTAL ABSCESS Left 12/04/2019   Procedure: IRRIGATION AND DEBRIDEMENT LEFT BUTTOCK ABSCESS;  Surgeon: Violeta Gelinas, MD;  Location: [facility] OR;  Service: General;  Laterality: Left;   INCISION AND DRAINAGE PERIRECTAL ABSCESS Left 12/10/2019   Procedure: REPEAT IRRIGATION AND DEBRIDEMENT BUTTOCK  ABSCESS;  Surgeon: Abigail Miyamoto, MD;  Location: [facility] OR;  Service: General;  Laterality:  Left;   IR FLUORO GUIDE CV LINE RIGHT  11/25/2020   IR RADIOLOGIST EVAL & MGMT  12/29/2020   IR US GUIDE VASC ACCESS RIGHT  11/25/2020    Family History  Family history unknown: Yes    Social History:  reports that she has never smoked. She has never used smokeless tobacco. She reports that she does not currently use alcohol. She reports that she does not currently use drugs.  Allergies:  Allergies  Allergen Reactions   Chlorhexidine Gluconate Itching   Vancomycin     Vancomycin infusion related reaction- May need Vanc to be given at a slower rate    Acetazolamide Er Rash    Medications: I have reviewed the patient's current medications.  Results for orders placed or performed during the hospital encounter of 04/21/21 (from the past 48 hour(s))  Glucose, capillary     Status: Abnormal   Collection Time: 04/24/21  7:45 AM  Result Value Ref Range   Glucose-Capillary 180 (H) 70 - 99 mg/dL    Comment: Glucose reference range applies only to samples taken after fasting for at least 8 hours.  CBC with Differential/Platelet     Status: Abnormal   Collection Time: 04/24/21 10:51 AM  Result Value Ref Range   WBC 8.7 4.0 - 10.5 K/uL   RBC 3.92 3.87 - 5.11 MIL/uL   Hemoglobin 11.1 (L) 12.0 - 15.0 g/dL   HCT 93.8 18.2 - 99.3 %   MCV 94.1 80.0 - 100.0 fL   MCH 28.3 26.0 -  34.0 pg   MCHC 30.1 30.0 - 36.0 g/dL   RDW 16.1 09.6 - 04.5 %   Platelets 132 (L) 150 - 400 K/uL    Comment: REPEATED TO VERIFY   nRBC 0.0 0.0 - 0.2 %   Neutrophils Relative % 71 %   Neutro Abs 6.1 1.7 - 7.7 K/uL   Lymphocytes Relative 17 %   Lymphs Abs 1.5 0.7 - 4.0 K/uL   Monocytes Relative 9 %   Monocytes Absolute 0.8 0.1 - 1.0 K/uL   Eosinophils Relative 2 %   Eosinophils Absolute 0.2 0.0 - 0.5 K/uL   Basophils Relative 0 %   Basophils Absolute 0.0 0.0 - 0.1 K/uL   Immature Granulocytes 1 %   Abs Immature Granulocytes 0.07 0.00 - 0.07 K/uL    Comment: Performed at [facility] Lab, [address]  [place], [place] [address]  Comprehensive metabolic panel     Status: Abnormal   Collection Time: 04/24/21 10:51 AM  Result Value Ref Range   Sodium 142 135 - 145 mmol/L   Potassium 3.6 3.5 - 5.1 mmol/L   Chloride 94 (L) 98 - 111 mmol/L   CO2 38 (H) 22 - 32 mmol/L   Glucose, Bld 165 (H) 70 - 99 mg/dL    Comment: Glucose reference range applies only to samples taken after fasting for at least 8 hours.   BUN 19 8 - 23 mg/dL   Creatinine, Ser 1.91 0.44 - 1.00 mg/dL   Calcium 8.6 (L) 8.9 - 10.3 mg/dL   Total Protein 5.4 (L) 6.5 - 8.1 g/dL   Albumin 2.3 (L) 3.5 - 5.0 g/dL   AST 9 (L) 15 - 41 U/L   ALT 9 0 - 44 U/L   Alkaline Phosphatase 30 (L) 38 - 126 U/L   Total Bilirubin 1.1 0.3 - 1.2 mg/dL   GFR, Estimated >47 >82 mL/min    Comment: (NOTE) Calculated using the CKD-EPI Creatinine Equation (2021)    Anion gap 10 5 - 15    Comment: Performed at [facility], [address], [place], [place] [address]  Magnesium     Status: None   Collection Time: 04/24/21 10:51 AM  Result Value Ref Range   Magnesium 2.1 1.7 - 2.4 mg/dL    Comment: Performed at [facility] Lab, [address], [place], [place] [address]  Phosphorus     Status: None   Collection Time: 04/24/21 10:51 AM  Result Value Ref Range   Phosphorus 4.5 2.5 - 4.6 mg/dL    Comment: Performed at [facility] Lab, [address] [place], [place] [address]  Glucose, capillary     Status: Abnormal   Collection Time: 04/24/21 11:57 AM  Result Value Ref Range   Glucose-Capillary 146 (H) 70 - 99 mg/dL    Comment: Glucose reference range applies only to samples taken after fasting for at least 8 hours.  Glucose, capillary     Status: Abnormal   Collection Time: 04/24/21  4:18 PM  Result Value Ref Range   Glucose-Capillary 116 (H) 70 - 99 mg/dL    Comment: Glucose reference range applies only to samples taken after fasting for at least 8 hours.  Glucose, capillary     Status: Abnormal   Collection Time: 04/24/21  9:10 PM   Result Value Ref Range   Glucose-Capillary 195 (H) 70 - 99 mg/dL    Comment: Glucose reference range applies only to samples taken after fasting for at  least 8 hours.  Glucose, capillary     Status: Abnormal   Collection Time: 04/25/21 12:21 AM  Result Value Ref Range   Glucose-Capillary 133 (H) 70 - 99 mg/dL    Comment: Glucose reference range applies only to samples taken after fasting for at least 8 hours.  Blood gas, venous     Status: Abnormal   Collection Time: 04/25/21  1:02 AM  Result Value Ref Range   FIO2 40.00    pH, Ven 7.368 7.250 - 7.430   pCO2, Ven 77.2 (HH) 44.0 - 60.0 mmHg    Comment: CRITICAL RESULT CALLED TO, READ BACK BY AND VERIFIED WITH: C GIBSON RN ON 04/25/21@0118  BY CHAYES    pO2, Ven 53.0 (H) 32.0 - 45.0 mmHg   Bicarbonate 43.4 (H) 20.0 - 28.0 mmol/L   Acid-Base Excess 17.1 (H) 0.0 - 2.0 mmol/L   O2 Saturation 85.0 %   Patient temperature 37.0    Drawn by [id number]    Sample type VENOUS     Comment: Performed at [facility], [address], [place], [place] [address]  CBC with Differential/Platelet     Status: Abnormal   Collection Time: 04/25/21  5:00 AM  Result Value Ref Range   WBC 8.0 4.0 - 10.5 K/uL   RBC 3.58 (L) 3.87 - 5.11 MIL/uL   Hemoglobin 10.1 (L) 12.0 - 15.0 g/dL   HCT 54.6 (L) 27.0 - 35.0 %   MCV 95.8 80.0 - 100.0 fL   MCH 28.2 26.0 - 34.0 pg   MCHC 29.4 (L) 30.0 - 36.0 g/dL   RDW 09.3 81.8 - 29.9 %   Platelets 114 (L) 150 - 400 K/uL    Comment: Immature Platelet Fraction may be clinically indicated, consider ordering this additional test [id number] REPEATED TO VERIFY PLATELET COUNT CONFIRMED BY SMEAR    nRBC 0.0 0.0 - 0.2 %   Neutrophils Relative % 58 %   Neutro Abs 4.7 1.7 - 7.7 K/uL   Lymphocytes Relative 24 %   Lymphs Abs 1.9 0.7 - 4.0 K/uL   Monocytes Relative 11 %   Monocytes Absolute 0.9 0.1 - 1.0 K/uL   Eosinophils Relative 6 %   Eosinophils Absolute 0.5 0.0 - 0.5 K/uL   Basophils Relative 0 %   Basophils Absolute  0.0 0.0 - 0.1 K/uL   Immature Granulocytes 1 %   Abs Immature Granulocytes 0.06 0.00 - 0.07 K/uL    Comment: Performed at [facility], [address], [place], [place] [address]  Comprehensive metabolic panel     Status: Abnormal   Collection Time: 04/25/21  5:00 AM  Result Value Ref Range   Sodium 140 135 - 145 mmol/L   Potassium 3.6 3.5 - 5.1 mmol/L   Chloride 90 (L) 98 - 111 mmol/L   CO2 42 (H) 22 - 32 mmol/L   Glucose, Bld 119 (H) 70 - 99 mg/dL    Comment: Glucose reference range applies only to samples taken after fasting for at least 8 hours.   BUN 20 8 - 23 mg/dL   Creatinine, Ser 1.75 0.44 - 1.00 mg/dL   Calcium 8.6 (L) 8.9 - 10.3 mg/dL   Total Protein 5.0 (L) 6.5 - 8.1 g/dL   Albumin 2.2 (L) 3.5 - 5.0 g/dL   AST 8 (L) 15 - 41 U/L   ALT 8 0 - 44 U/L   Alkaline Phosphatase 28 (L) 38 - 126 U/L   Total Bilirubin 0.9 0.3 - 1.2  mg/dL   GFR, Estimated >78 >29 mL/min    Comment: (NOTE) Calculated using the CKD-EPI Creatinine Equation (2021)    Anion gap 8 5 - 15    Comment: Performed at [facility] Lab, [address], [place], [place] [address]  Phosphorus     Status: None   Collection Time: 04/25/21  5:00 AM  Result Value Ref Range   Phosphorus 3.8 2.5 - 4.6 mg/dL    Comment: Performed at [facility] Lab, [address] [place], [place] [address]  Magnesium     Status: None   Collection Time: 04/25/21  5:00 AM  Result Value Ref Range   Magnesium 2.2 1.7 - 2.4 mg/dL    Comment: Performed at [facility], [address], [place], [place] [address]  Brain natriuretic peptide     Status: Abnormal   Collection Time: 04/25/21  5:05 AM  Result Value Ref Range   B Natriuretic Peptide 305.1 (H) 0.0 - 100.0 pg/mL    Comment: Performed at [facility] Lab, [address] [place], [place] [address]  Procalcitonin - Baseline     Status: None   Collection Time: 04/25/21  5:05 AM  Result Value Ref Range   Procalcitonin <0.10 ng/mL    Comment:         Interpretation: PCT (Procalcitonin) <= 0.5 ng/mL: Systemic infection (sepsis) is not likely. Local bacterial infection is possible. (NOTE)       Sepsis PCT Algorithm           Lower Respiratory Tract                                      Infection PCT Algorithm    ----------------------------     ----------------------------         PCT < 0.25 ng/mL                PCT < 0.10 ng/mL          Strongly encourage             Strongly discourage   discontinuation of antibiotics    initiation of antibiotics    ----------------------------     -----------------------------       PCT 0.25 - 0.50 ng/mL            PCT 0.10 - 0.25 ng/mL               OR       >80% decrease in PCT            Discourage initiation of                                            antibiotics      Encourage discontinuation           of antibiotics    ----------------------------     -----------------------------         PCT >= 0.50 ng/mL              PCT 0.26 - 0.50 ng/mL               AND        <80% decrease in PCT  Encourage initiation of                                             antibiotics       Encourage continuation           of antibiotics    ----------------------------     -----------------------------        PCT >= 0.50 ng/mL                  PCT > 0.50 ng/mL               AND         increase in PCT                  Strongly encourage                                      initiation of antibiotics    Strongly encourage escalation           of antibiotics                                     -----------------------------                                           PCT <= 0.25 ng/mL                                                 OR                                        > 80% decrease in PCT                                      Discontinue / Do not initiate                                             antibiotics  Performed at [facility], [address] [place], [place] [address]    Glucose, capillary     Status: Abnormal   Collection Time: 04/25/21  5:49 AM  Result Value Ref Range   Glucose-Capillary 132 (H) 70 - 99 mg/dL    Comment: Glucose reference range applies only to samples taken after fasting for at least 8 hours.  MRSA Next Gen by PCR, Nasal     Status: Abnormal   Collection Time: 04/25/21  5:54 AM   Specimen: Nasal Mucosa; Nasal Swab  Result Value Ref Range   MRSA by PCR Next Gen DETECTED (A) NOT DETECTED    Comment: RESULT CALLED TO, READ BACK BY AND VERIFIED WITH: RN MICHELLE  TOLLER 04/25/21@7 :05 BY TW (NOTE) The GeneXpert MRSA Assay (FDA approved for NASAL specimens only), is one component of a comprehensive MRSA colonization surveillance program. It is not intended to diagnose MRSA infection nor to guide or monitor treatment for MRSA infections. Test performance is not FDA approved in patients less than 73 years old. Performed at [facility], [address] [place], [place] [address]   I-STAT 7, (LYTES, BLD GAS, ICA, H+H)     Status: Abnormal   Collection Time: 04/25/21  6:26 AM  Result Value Ref Range   pH, Arterial 7.406 7.350 - 7.450   pCO2 arterial 74.7 (HH) 32.0 - 48.0 mmHg   pO2, Arterial 80 (L) 83.0 - 108.0 mmHg   Bicarbonate 46.8 (H) 20.0 - 28.0 mmol/L   TCO2 49 (H) 22 - 32 mmol/L   O2 Saturation 95.0 %   Acid-Base Excess 19.0 (H) 0.0 - 2.0 mmol/L   Sodium 141 135 - 145 mmol/L   Potassium 3.6 3.5 - 5.1 mmol/L   Calcium, Ion 1.17 1.15 - 1.40 mmol/L   HCT 32.0 (L) 36.0 - 46.0 %   Hemoglobin 10.9 (L) 12.0 - 15.0 g/dL   Patient temperature 78.6 F    Collection site Radial    Drawn by RT    Sample type ARTERIAL    Comment NOTIFIED PHYSICIAN   Glucose, capillary     Status: Abnormal   Collection Time: 04/25/21  8:08 AM  Result Value Ref Range   Glucose-Capillary 132 (H) 70 - 99 mg/dL    Comment: Glucose reference range applies only to samples taken after fasting for at least 8 hours.  Glucose, capillary     Status: Abnormal    Collection Time: 04/25/21 11:14 AM  Result Value Ref Range   Glucose-Capillary 114 (H) 70 - 99 mg/dL    Comment: Glucose reference range applies only to samples taken after fasting for at least 8 hours.  Glucose, capillary     Status: Abnormal   Collection Time: 04/25/21  5:15 PM  Result Value Ref Range   Glucose-Capillary 169 (H) 70 - 99 mg/dL    Comment: Glucose reference range applies only to samples taken after fasting for at least 8 hours.  Urinalysis, Routine w reflex microscopic     Status: Abnormal   Collection Time: 04/25/21  6:15 PM  Result Value Ref Range   Color, Urine YELLOW YELLOW   APPearance CLEAR CLEAR   Specific Gravity, Urine 1.032 (H) 1.005 - 1.030   pH 6.0 5.0 - 8.0   Glucose, UA >=500 (A) NEGATIVE mg/dL   Hgb urine dipstick NEGATIVE NEGATIVE   Bilirubin Urine NEGATIVE NEGATIVE   Ketones, ur 20 (A) NEGATIVE mg/dL   Protein, ur NEGATIVE NEGATIVE mg/dL   Nitrite NEGATIVE NEGATIVE   Leukocytes,Ua NEGATIVE NEGATIVE   RBC / HPF 0-5 0 - 5 RBC/hpf   WBC, UA 0-5 0 - 5 WBC/hpf   Bacteria, UA NONE SEEN NONE SEEN   Squamous Epithelial / LPF 0-5 0 - 5    Comment: Performed at [facility] Lab, [address], [place], [place] [address]  Glucose, capillary     Status: Abnormal   Collection Time: 04/25/21  8:28 PM  Result Value Ref Range   Glucose-Capillary 138 (H) 70 - 99 mg/dL    Comment: Glucose reference range applies only to samples taken after fasting for at least 8 hours.  Glucose, capillary     Status: Abnormal   Collection Time: 04/25/21 11:14  PM  Result Value Ref Range   Glucose-Capillary 128 (H) 70 - 99 mg/dL    Comment: Glucose reference range applies only to samples taken after fasting for at least 8 hours.  CBC with Differential/Platelet     Status: Abnormal   Collection Time: 04/26/21  1:59 AM  Result Value Ref Range   WBC 7.4 4.0 - 10.5 K/uL   RBC 3.90 3.87 - 5.11 MIL/uL   Hemoglobin 10.9 (L) 12.0 - 15.0 g/dL   HCT 40.9 81.1 - 91.4 %   MCV 94.4  80.0 - 100.0 fL   MCH 27.9 26.0 - 34.0 pg   MCHC 29.6 (L) 30.0 - 36.0 g/dL   RDW 78.2 95.6 - 21.3 %   Platelets 117 (L) 150 - 400 K/uL    Comment: Immature Platelet Fraction may be clinically indicated, consider ordering this additional test [id number] CONSISTENT WITH PREVIOUS RESULT REPEATED TO VERIFY    nRBC 0.0 0.0 - 0.2 %   Neutrophils Relative % 54 %   Neutro Abs 4.0 1.7 - 7.7 K/uL   Lymphocytes Relative 24 %   Lymphs Abs 1.7 0.7 - 4.0 K/uL   Monocytes Relative 10 %   Monocytes Absolute 0.8 0.1 - 1.0 K/uL   Eosinophils Relative 11 %   Eosinophils Absolute 0.8 (H) 0.0 - 0.5 K/uL   Basophils Relative 0 %   Basophils Absolute 0.0 0.0 - 0.1 K/uL   Immature Granulocytes 1 %   Abs Immature Granulocytes 0.07 0.00 - 0.07 K/uL    Comment: Performed at [facility] Lab, [address], [place], [place] [address]  Comprehensive metabolic panel     Status: Abnormal   Collection Time: 04/26/21  1:59 AM  Result Value Ref Range   Sodium 138 135 - 145 mmol/L   Potassium 3.2 (L) 3.5 - 5.1 mmol/L   Chloride 88 (L) 98 - 111 mmol/L   CO2 40 (H) 22 - 32 mmol/L   Glucose, Bld 199 (H) 70 - 99 mg/dL    Comment: Glucose reference range applies only to samples taken after fasting for at least 8 hours.   BUN 21 8 - 23 mg/dL   Creatinine, Ser 2.84 0.44 - 1.00 mg/dL   Calcium 8.5 (L) 8.9 - 10.3 mg/dL   Total Protein 5.3 (L) 6.5 - 8.1 g/dL   Albumin 2.2 (L) 3.5 - 5.0 g/dL   AST 10 (L) 15 - 41 U/L   ALT 11 0 - 44 U/L   Alkaline Phosphatase 31 (L) 38 - 126 U/L   Total Bilirubin 0.7 0.3 - 1.2 mg/dL   GFR, Estimated >13 >24 mL/min    Comment: (NOTE) Calculated using the CKD-EPI Creatinine Equation (2021)    Anion gap 10 5 - 15    Comment: Performed at [facility] Lab, [address], [place], [place] [address]  Magnesium     Status: None   Collection Time: 04/26/21  1:59 AM  Result Value Ref Range   Magnesium 2.1 1.7 - 2.4 mg/dL    Comment: Performed at [facility], [address], [place], [place] [address]  Phosphorus     Status: Abnormal   Collection Time: 04/26/21  1:59 AM  Result Value Ref Range   Phosphorus 2.3 (L) 2.5 - 4.6 mg/dL    Comment: Performed at [facility], [address] [place], [place] [address]  Glucose, capillary     Status: Abnormal   Collection Time: 04/26/21  3:22 AM  Result Value Ref Range   Glucose-Capillary 287 (H) 70 - 99 mg/dL    Comment: Glucose reference range applies only to samples taken after fasting for at least 8 hours.    DG CHEST PORT 1 VIEW  Result Date: 04/25/2021 CLINICAL DATA:  Shortness of breath. EXAM: PORTABLE CHEST 1 VIEW COMPARISON:  April 23, 2021. FINDINGS: Stable cardiomediastinal silhouette. No pneumothorax is noted. Left lung is clear. Stable elevated right hemidiaphragm is noted with right basilar atelectasis or infiltrate and associated pleural effusion. Bony thorax is unremarkable. IMPRESSION: Stable elevated right hemidiaphragm is noted with right basilar atelectasis or infiltrate and associated pleural effusion. Electronically Signed   By: Lupita Raider M.D.   On: 04/25/2021 09:37    Review of Systems  Unable to perform ROS: Patient unresponsive  Blood pressure (!) 106/55, pulse (!) 115, temperature 98.8 F (37.1 C), temperature source Axillary, resp. rate (!) 21, height 5\' 6"  (1.676 m), weight 98.4 kg, SpO2 99 %. Physical Exam Vitals and nursing note reviewed.  Constitutional:      General: She is not in acute distress.    Appearance: She is well-developed. She is obese. She is not ill-appearing or toxic-appearing.  HENT:     Head: Normocephalic and atraumatic.  Pulmonary:     Effort: Pulmonary effort is normal.  Musculoskeletal:     Comments: Per nurse was able to move all limbs  Neurological:     Comments: Per nurse at baseline     Assessment/Plan: Case and Plan discussed with Dr.  Becky Hendricks is a 75 yr old female with a history of chronic hypoxic hypercapnic respiratory  failure secondary to OSA, noncompliant with BiPAP at night, morbid obesity, IDDM, HTN, PAF on Eliquis, bipolar disorder, sent from nursing home for AMS. Recently discharged from the hospital on 8/26 and 9/7 after admissions stemming from acute hypoxic and hypercarbic respiratory failure due to her refusal to wear a BiPAP at night.      Patient is still sedated from Ativan given last night.  Per nursing though brief amount of time she has been awake she has been at her baseline as compared to yesterday.  Will not make any changes to medications today.  We will continue to monitor.     Bipolar Disorder: -Continue Depakote 750 mg BID -Continue Risperdal M-tabs 2 mg BID      Continue rest of care per Primary Team Psychiatry to continue to follow     Disposition: No evidence of imminent risk to self or others at present. Does not meet criteria for inpatient treatment.      11/7 04/26/2021, 7:18 AM

## 2021-04-26 NOTE — Progress Notes (Signed)
     Referral received for [facility] :goals of care discussion. Chart reviewed and updates received from RN. Patient assessed and is unable to engage appropriately in discussions. Flight of ideas, answers some questions appropriately. Seems agitated.   I was able to speak with patient's next-of-kin/brother [name]). I introduced myself and Palliative's role in Samiyyah's care while hospitalized. Brother states he is planning to visit tomorrow and would like to speak at the bedside. He was not able to confirm a meeting time but advised he would arrive prior to 2pm.   Acknowledged his timing and requested to request nurse secure message me to notify he has arrived.   Thank you for your referral and allowing PMT to assist in Mr./Mrs. Sander Nephew care.   Willette Alma, AGPCNP-BC Palliative Medicine Team  Phone: [phone] Pager: [phone] Amion: N. Cousar   NO CHARGE

## 2021-04-26 NOTE — Progress Notes (Signed)
Patient is awake and worked up. Yelling out into the unit. Getting her HR up into 130-140's. Writer reached out to triad to see if patient can receive something that will help calm her. Will await a call back at this time.   Writer has already tried to comfort patient, gave patient snacks, turned on television, removed bipap at patients request. Patient continues to cry and scream on unit.

## 2021-04-26 NOTE — Progress Notes (Signed)
RRT attempted to place Pt on BI PAP. Pt displayed a bit of confusion. Pt refused and asked if RT could wait till a unspecified later time. Pt displayed no respiratory distress and vitals were stable.

## 2021-04-26 NOTE — Progress Notes (Signed)
PROGRESS NOTE    Becky Hendricks  UXL:[id number] DOB: 1945-12-21 DOA: 04/21/2021 PCP: Roderic Scarce, MD    Brief Narrative:  Becky Hendricks is a 75 year old female with past medical history significant for chronic respiratory failure/OSA noncompliant with CPAP/BiPAP, DM2, essential hypertension, paroxysmal atrial fibrillation on Eliquis, bipolar disorder, morbid obesity who presented from SNF to [facility] ED on 9/13 with altered mental status.  Personnel at her nursing home found her obtunded in her bed with her BiPAP off; EMS was activated and patient was transported to the ED for further evaluation.    Patient with multiple recent hospitalizations, twice in the last month for similar episodes of hypoxic and hypercapnic respiratory failure due to medical noncompliance with her home BiPAP.  In the ED, ABG notable for decompensated acute on chronic hypercapnic respiratory failure with pH 7.29, PaCO2 100, PaO2 71.  Chest x-ray with similar bilateral lower field changes.  Patient was initially placed on BiPAP with improvement of her ABG with pH 7.32 and PaCO2 of 95 and patient became more awake with agitation and threatening to leave AMA.  Given her recurrent hospitalizations and continued confusion, EDP consulted [facility] for further evaluation and management.   Assessment & Plan:   Active Problems:   Acute on chronic respiratory failure with hypoxia and hypercapnia (HCC)   Acute and chronic respiratory failure with hypercapnia (HCC)   Respiratory failure (HCC)   Acute on chronic hypoxic/hypercapnic respiratory failure, POA Acute metabolic encephalopathy, POA Patient presenting from her SNF after being found altered, obtunded with BiPAP off.  ABG on admission with PaCO2 of 100.  Etiology likely secondary to her noncompliance with home BiPAP with acute on chronic hypoxic/hypercapnic respiratory failure; with multiple hospitalizations and requiring intubation over the last month. --PCCM following --ABG this  am pH 7.339, PaCO2 76.3, PaO2 92. --Continue BiPAP nightly and while sleeping --Continue supple oxygen, maintain SPO2 greater than 88% --Overall very poor prognosis given her noncompliance with medical therapy, palliative care consulted for assistance with goals of care and medical decision making.  Hypotension Unclear etiology, patient was initially started on a Cardizem drip for A. fib with RVR which was now weaned off.  Low suspicion for infectious etiology as patient is afebrile without leukocytosis. --PCCM following, appreciate assistance --Continues on Levophed --PCCM starting midodrine 10mg  PO TID today --Continue monitor BP closely  Acute on chronic diastolic congestive heart failure --Holding beta-blocker, diuretics in the setting of hypotension as above. --Strict I's and O's and daily weights  Hypokalemia Potassium 3.2 today, will replete. --Repeat electrolytes in a.m. to include magnesium  Elevated ammonia level --Lactulose 10 mg p.o. twice daily  Paroxysmal atrial fibrillation with RVR TSH within normal limits.  Initially started on a Cardizem drip but was discontinued due to hypotension.  Cardiology was initially consulted, but now signed off 9/12. --Apixaban 5 mg p.o. twice daily --Continue monitor on telemetry; RVR persisting so we will initiate amiodarone drip.  HLD: Atorvastatin 10 mg p.o. daily  Bipolar disorder --Psychiatry following, appreciate assistance --Received Ativan overnight for agitation --Depakote 750 mg p.o. twice daily --Risperdal 2 mg p.o. twice daily  Hx COVID-19 viral infection April 2021.  GERD: Protonix 40 mg IV every 24 hours  Goals of care: Previous hospitalist talked with patient's brother, May 2021 regarding her medical condition and noncompliance with therapy leading to recurrent hospitalizations.  At that time, [name] expressed that we continue aggressive measures and remain full code and okay for central line placement/intubation.   Given her recurrent hospitalizations and noncompliance with  medical therapy outpatient, will consult palliative care for assistance with further goals of care/medical decision making discussions as she remains a very poor prognosis with high risk for further compromise/death given her noncompliance. --Patient's brother, [name] updated via telephone this morning who plans to come to the hospital on 9/15  DVT prophylaxis: Lovenox   Code Status: Full Code Family Communication: Updated patient's brother, [name] via telephone this morning.  Disposition Plan:  Level of care: ICU Status is: Inpatient  Remains inpatient appropriate because:Hemodynamically unstable, Persistent severe electrolyte disturbances, Altered mental status, Unsafe d/c plan, IV treatments appropriate due to intensity of illness or inability to take PO, and Inpatient level of care appropriate due to severity of illness  Dispo: The patient is from: SNF              Anticipated d/c is to: SNF              Patient currently is not medically stable to d/c.   Difficult to place patient No   Consultants:  PCCM Cardiology -signed off 9/12  Procedures:  BiPAP  Antimicrobials:  None   Subjective: Patient seen examined at bedside, resting comfortably.  Remains on BiPAP.  Nonresponsive to verbal command this morning.  Received IV Ativan overnight for agitation.  No family present at bedside.  RN at bedside.  Remains on Levophed drip, requirement slightly increased overnight.  Continues in A. fib with RVR.  We will start amiodarone drip due to hypotension and unable to use alternative medications.  Unable to obtain any further ROS from patient due to her current mental status.  Objective: Vitals:   04/26/21 1015 04/26/21 1030 04/26/21 1045 04/26/21 1100  BP: 122/83 107/75 120/67 100/64  Pulse: (!) 108 (!) 115 (!) 120 (!) 122  Resp: 17 17 (!) 31 19  Temp:    98.3 F (36.8 C)  TempSrc:    Oral  SpO2: 97% 98% 100% 99%   Weight:      Height:        Intake/Output Summary (Last 24 hours) at 04/26/2021 1215 Last data filed at 04/26/2021 1100 Gross per 24 hour  Intake 1252.72 ml  Output 950 ml  Net 302.72 ml   Filed Weights   04/25/21 0550  Weight: 98.4 kg    Examination:  General exam: Somnolent, nonresponsive to verbal command, obtunded on BiPAP Respiratory system: Breath sounds slightly decreased bilateral bases, on BiPAP Cardiovascular system: S1 & S2 heard, irregularly irregular rhythm, tachycardic. No JVD, murmurs, rubs, gallops or clicks.  Trace pedal edema bilaterally. Gastrointestinal system: Abdomen is nondistended, soft and nontender. No organomegaly or masses felt. Normal bowel sounds heard. Central nervous system: Somnolent, obtunded Extremities: Moving all extremities independently Skin: No rashes, lesions or ulcers Psychiatry: Unable to assess due to mental status    Data Reviewed: I have personally reviewed following labs and imaging studies  CBC: Recent Labs  Lab 04/22/21 0912 04/23/21 0336 04/24/21 1051 04/25/21 0500 04/25/21 0626 04/26/21 0159 04/26/21 0853  WBC 6.6 7.1 8.7 8.0  --  7.4  --   NEUTROABS 3.6 3.5 6.1 4.7  --  4.0  --   HGB 9.7* 10.5* 11.1* 10.1* 10.9* 10.9* 10.9*  HCT 31.9* 33.6* 36.9 34.3* 32.0* 36.8 32.0*  MCV 92.5 91.6 94.1 95.8  --  94.4  --   PLT 137* 148* 132* 114*  --  117*  --    Basic Metabolic Panel: Recent Labs  Lab 04/22/21 0912 04/23/21 0336 04/24/21 1051 04/25/21 0500 04/25/21  2542 04/26/21 0159 04/26/21 0853  NA 138 139 142 140 141 138 137  K 3.8 3.6 3.6 3.6 3.6 3.2* 3.7  CL 93* 92* 94* 90*  --  88*  --   CO2 38* 36* 38* 42*  --  40*  --   GLUCOSE 132* 164* 165* 119*  --  199*  --   BUN 14 16 19 20   --  21  --   CREATININE 0.47 0.74 0.74 0.64  --  0.60  --   CALCIUM 8.3* 8.6* 8.6* 8.6*  --  8.5*  --   MG 1.9 1.9 2.1 2.2  --  2.1  --   PHOS 2.5 3.5 4.5 3.8  --  2.3*  --    GFR: Estimated Creatinine Clearance: 74.1 mL/min  (by C-G formula based on SCr of 0.6 mg/dL). Liver Function Tests: Recent Labs  Lab 04/22/21 0912 04/23/21 0336 04/24/21 1051 04/25/21 0500 04/26/21 0159  AST 8* 8* 9* 8* 10*  ALT 7 7 9 8 11   ALKPHOS 33* 33* 30* 28* 31*  BILITOT 0.8 0.3 1.1 0.9 0.7  PROT 5.0* 5.3* 5.4* 5.0* 5.3*  ALBUMIN 2.2* 2.3* 2.3* 2.2* 2.2*   No results for input(s): LIPASE, AMYLASE in the last 168 hours. Recent Labs  Lab 04/22/21 1658 04/23/21 0851  AMMONIA 101* 23   Coagulation Profile: No results for input(s): INR, PROTIME in the last 168 hours. Cardiac Enzymes: No results for input(s): CKTOTAL, CKMB, CKMBINDEX, TROPONINI in the last 168 hours. BNP (last 3 results) No results for input(s): PROBNP in the last 8760 hours. HbA1C: No results for input(s): HGBA1C in the last 72 hours. CBG: Recent Labs  Lab 04/25/21 2028 04/25/21 2314 04/26/21 0322 04/26/21 0751 04/26/21 1119  GLUCAP 138* 128* 287* 168* 164*   Lipid Profile: No results for input(s): CHOL, HDL, LDLCALC, TRIG, CHOLHDL, LDLDIRECT in the last 72 hours. Thyroid Function Tests: No results for input(s): TSH, T4TOTAL, FREET4, T3FREE, THYROIDAB in the last 72 hours. Anemia Panel: No results for input(s): VITAMINB12, FOLATE, FERRITIN, TIBC, IRON, RETICCTPCT in the last 72 hours. Sepsis Labs: Recent Labs  Lab 04/25/21 0505  PROCALCITON <0.10    Recent Results (from the past 240 hour(s))  Resp Panel by RT-PCR (Flu A&B, Covid) Nasopharyngeal Swab     Status: None   Collection Time: 04/18/21 12:18 PM   Specimen: Nasopharyngeal Swab; Nasopharyngeal(NP) swabs in vial transport medium  Result Value Ref Range Status   SARS Coronavirus 2 by RT PCR NEGATIVE NEGATIVE Final    Comment: (NOTE) SARS-CoV-2 target nucleic acids are NOT DETECTED.  The SARS-CoV-2 RNA is generally detectable in upper respiratory specimens during the acute phase of infection. The lowest concentration of SARS-CoV-2 viral copies this assay can detect is 138  copies/mL. A negative result does not preclude SARS-Cov-2 infection and should not be used as the sole basis for treatment or other patient management decisions. A negative result may occur with  improper specimen collection/handling, submission of specimen other than nasopharyngeal swab, presence of viral mutation(s) within the areas targeted by this assay, and inadequate number of viral copies(<138 copies/mL). A negative result must be combined with clinical observations, patient history, and epidemiological information. The expected result is Negative.  Fact Sheet for Patients:  04/27/21  Fact Sheet for Healthcare Providers:  06/18/21  This test is no t yet approved or cleared by the [employer] FDA and  has been authorized for detection and/or diagnosis of SARS-CoV-2 by FDA under an Emergency Use  Authorization (EUA). This EUA will remain  in effect (meaning this test can be used) for the duration of the COVID-19 declaration under Section 564(b)(1) of the Act, 21 U.S.C.section 360bbb-3(b)(1), unless the authorization is terminated  or revoked sooner.       Influenza A by PCR NEGATIVE NEGATIVE Final   Influenza B by PCR NEGATIVE NEGATIVE Final    Comment: (NOTE) The Xpert Xpress SARS-CoV-2/FLU/RSV plus assay is intended as an aid in the diagnosis of influenza from Nasopharyngeal swab specimens and should not be used as a sole basis for treatment. Nasal washings and aspirates are unacceptable for Xpert Xpress SARS-CoV-2/FLU/RSV testing.  Fact Sheet for Patients: [employer]gerCourse[employer]com  Fact Sheet for Healthcare Providers: SeriousBroker.it  This test is not yet approved or cleared by the [place] FDA and has been authorized for detection and/or diagnosis of SARS-CoV-2 by FDA under an Emergency Use Authorization (EUA). This EUA will remain in effect (meaning  this test can be used) for the duration of the COVID-19 declaration under Section 564(b)(1) of the Act, 21 U.S.C. section 360bbb-3(b)(1), unless the authorization is terminated or revoked.  Performed at [facility], [address] [place], [place] [address]   Resp Panel by RT-PCR (Flu A&B, Covid) Nasopharyngeal Swab     Status: None   Collection Time: 04/21/21 10:18 AM   Specimen: Nasopharyngeal Swab; Nasopharyngeal(NP) swabs in vial transport medium  Result Value Ref Range Status   SARS Coronavirus 2 by RT PCR NEGATIVE NEGATIVE Final    Comment: (NOTE) SARS-CoV-2 target nucleic acids are NOT DETECTED.  The SARS-CoV-2 RNA is generally detectable in upper respiratory specimens during the acute phase of infection. The lowest concentration of SARS-CoV-2 viral copies this assay can detect is 138 copies/mL. A negative result does not preclude SARS-Cov-2 infection and should not be used as the sole basis for treatment or other patient management decisions. A negative result may occur with  improper specimen collection/handling, submission of specimen other than nasopharyngeal swab, presence of viral mutation(s) within the areas targeted by this assay, and inadequate number of viral copies(<138 copies/mL). A negative result must be combined with clinical observations, patient history, and epidemiological information. The expected result is Negative.  Fact Sheet for Patients:  BloggerCourse.com  Fact Sheet for Healthcare Providers:  SeriousBroker.it  This test is no t yet approved or cleared by the [place] FDA and  has been authorized for detection and/or diagnosis of SARS-CoV-2 by FDA under an Emergency Use Authorization (EUA). This EUA will remain  in effect (meaning this test can be used) for the duration of the COVID-19 declaration under Section 564(b)(1) of the Act, 21 U.S.C.section 360bbb-3(b)(1), unless the  authorization is terminated  or revoked sooner.       Influenza A by PCR NEGATIVE NEGATIVE Final   Influenza B by PCR NEGATIVE NEGATIVE Final    Comment: (NOTE) The Xpert Xpress SARS-CoV-2/FLU/RSV plus assay is intended as an aid in the diagnosis of influenza from Nasopharyngeal swab specimens and should not be used as a sole basis for treatment. Nasal washings and aspirates are unacceptable for Xpert Xpress SARS-CoV-2/FLU/RSV testing.  Fact Sheet for Patients: BloggerCourse.com  Fact Sheet for Healthcare Providers: SeriousBroker.it  This test is not yet approved or cleared by the [place] FDA and has been authorized for detection and/or diagnosis of SARS-CoV-2 by FDA under an Emergency Use Authorization (EUA). This EUA will remain in effect (meaning this test can be used) for the duration of the COVID-19 declaration under  Section 564(b)(1) of the Act, 21 U.S.C. section 360bbb-3(b)(1), unless the authorization is terminated or revoked.  Performed at [facility], [address] [place], [place] [address]   Culture, blood (routine x 2)     Status: None (Preliminary result)   Collection Time: 04/22/21  4:58 PM   Specimen: BLOOD RIGHT HAND  Result Value Ref Range Status   Specimen Description BLOOD RIGHT HAND  Final   Special Requests   Final    BOTTLES DRAWN AEROBIC ONLY Blood Culture adequate volume   Culture   Final    NO GROWTH 4 DAYS Performed at [facility], [address] [place], [place] [address]    Report Status PENDING  Incomplete  Culture, blood (routine x 2)     Status: None (Preliminary result)   Collection Time: 04/22/21  5:07 PM   Specimen: BLOOD  Result Value Ref Range Status   Specimen Description BLOOD LEFT ANTECUBITAL  Final   Special Requests   Final    BOTTLES DRAWN AEROBIC ONLY Blood Culture adequate volume   Culture   Final    NO GROWTH 4 DAYS Performed at [facility]  Lab, [address] [place], [place] [address]    Report Status PENDING  Incomplete  MRSA Next Gen by PCR, Nasal     Status: Abnormal   Collection Time: 04/25/21  5:54 AM   Specimen: Nasal Mucosa; Nasal Swab  Result Value Ref Range Status   MRSA by PCR Next Gen DETECTED (A) NOT DETECTED Final    Comment: RESULT CALLED TO, READ BACK BY AND VERIFIED WITH: RN MICHELLE TOLLER 04/25/21@7 :05 BY TW (NOTE) The GeneXpert MRSA Assay (FDA approved for NASAL specimens only), is one component of a comprehensive MRSA colonization surveillance program. It is not intended to diagnose MRSA infection nor to guide or monitor treatment for MRSA infections. Test performance is not FDA approved in patients less than 31 years old. Performed at [facility], [address], [place], [place] [address]          Radiology Studies: DG CHEST PORT 1 VIEW  Result Date: 04/26/2021 CLINICAL DATA:  Shortness of breath EXAM: PORTABLE CHEST 1 VIEW COMPARISON:  04/25/2021 FINDINGS: Heart is borderline in size. Bilateral lower lobe airspace opacities and probable layering effusions. No acute bony abnormality. IMPRESSION: Layering bilateral effusions with bibasilar atelectasis or infiltrates. Worsening aeration at the left base since prior study. Electronically Signed   By: Charlett Nose M.D.   On: 04/26/2021 08:50   DG CHEST PORT 1 VIEW  Result Date: 04/25/2021 CLINICAL DATA:  Shortness of breath. EXAM: PORTABLE CHEST 1 VIEW COMPARISON:  April 23, 2021. FINDINGS: Stable cardiomediastinal silhouette. No pneumothorax is noted. Left lung is clear. Stable elevated right hemidiaphragm is noted with right basilar atelectasis or infiltrate and associated pleural effusion. Bony thorax is unremarkable. IMPRESSION: Stable elevated right hemidiaphragm is noted with right basilar atelectasis or infiltrate and associated pleural effusion. Electronically Signed   By: Lupita Raider M.D.   On: 04/25/2021 09:37         Scheduled Meds:  Chlorhexidine Gluconate Cloth  6 each Topical Q0600   divalproex  750 mg Oral Q12H   enoxaparin (LOVENOX) injection  95 mg Subcutaneous Q12H   insulin aspart  0-15 Units Subcutaneous Q4H   mouth rinse  15 mL Mouth Rinse BID   midodrine  10 mg Oral TID   mupirocin ointment  1 application Nasal BID  pantoprazole (PROTONIX) IV  40 mg Intravenous Q24H   polyethylene glycol  17 g Oral Daily   potassium & sodium phosphates  1 packet Oral TID AC & HS   potassium chloride  40 mEq Oral Once   risperiDONE  2 mg Oral BID   Continuous Infusions:  sodium chloride Stopped (04/26/21 1005)   norepinephrine (LEVOPHED) Adult infusion 4 mcg/min (04/26/21 1100)   potassium chloride 10 mEq (04/26/21 1135)     LOS: 5 days    Critical Care Time Upon my evaluation, this patient had a high probability of imminent or life-threatening deterioration due to acute metabolic encephalopathy, acute on chronic hypercarbic respiratory failure, hypotension which required my direct attention, intervention, and personal management.  I have personally provided 42 minutes of critical care time exclusive of my time spent on separately billable procedures.  Time includes review of laboratory data, radiology results, discussion with consultants, and monitoring for potential decompensation.       [name] [place], DO [facility] Available via Epic secure chat 7am-7pm After these hours, please refer to coverage provider listed on [email or web address] 04/26/2021, 12:15 PM

## 2021-04-27 DIAGNOSIS — L89304 Pressure ulcer of unspecified buttock, stage 4: Secondary | ICD-10-CM | POA: Diagnosis not present

## 2021-04-27 DIAGNOSIS — G9340 Encephalopathy, unspecified: Secondary | ICD-10-CM | POA: Diagnosis not present

## 2021-04-27 DIAGNOSIS — R0902 Hypoxemia: Secondary | ICD-10-CM | POA: Diagnosis not present

## 2021-04-27 DIAGNOSIS — J9621 Acute and chronic respiratory failure with hypoxia: Secondary | ICD-10-CM | POA: Diagnosis not present

## 2021-04-27 DIAGNOSIS — Z7189 Other specified counseling: Secondary | ICD-10-CM

## 2021-04-27 DIAGNOSIS — R0602 Shortness of breath: Secondary | ICD-10-CM

## 2021-04-27 DIAGNOSIS — Z515 Encounter for palliative care: Secondary | ICD-10-CM

## 2021-04-27 DIAGNOSIS — J9622 Acute and chronic respiratory failure with hypercapnia: Secondary | ICD-10-CM | POA: Diagnosis not present

## 2021-04-27 LAB — POCT I-STAT 7, (LYTES, BLD GAS, ICA,H+H)
Acid-Base Excess: 17 mmol/L — ABNORMAL HIGH (ref 0.0–2.0)
Acid-Base Excess: 16 mmol/L — ABNORMAL HIGH (ref 0.0–2.0)
Bicarbonate: 44.8 mmol/L — ABNORMAL HIGH (ref 20.0–28.0)
Bicarbonate: 44.3 mmol/L — ABNORMAL HIGH (ref 20.0–28.0)
Calcium, Ion: 1.11 mmol/L — ABNORMAL LOW (ref 1.15–1.40)
Calcium, Ion: 1.13 mmol/L — ABNORMAL LOW (ref 1.15–1.40)
HCT: 31 % — ABNORMAL LOW (ref 36.0–46.0)
HCT: 31 % — ABNORMAL LOW (ref 36.0–46.0)
Hemoglobin: 10.5 g/dL — ABNORMAL LOW (ref 12.0–15.0)
Hemoglobin: 10.5 g/dL — ABNORMAL LOW (ref 12.0–15.0)
O2 Saturation: 97 %
O2 Saturation: 99 %
Patient temperature: 100.7
Patient temperature: 99.1
Potassium: 4.5 mmol/L (ref 3.5–5.1)
Potassium: 4.1 mmol/L (ref 3.5–5.1)
Sodium: 135 mmol/L (ref 135–145)
Sodium: 134 mmol/L — ABNORMAL LOW (ref 135–145)
TCO2: 47 mmol/L — ABNORMAL HIGH (ref 22–32)
TCO2: 47 mmol/L — ABNORMAL HIGH (ref 22–32)
pCO2 arterial: 73.3 mmHg (ref 32.0–48.0)
pCO2 arterial: 76.2 mmHg (ref 32.0–48.0)
pH, Arterial: 7.399 (ref 7.350–7.450)
pH, Arterial: 7.374 (ref 7.350–7.450)
pO2, Arterial: 102 mmHg (ref 83.0–108.0)
pO2, Arterial: 128 mmHg — ABNORMAL HIGH (ref 83.0–108.0)

## 2021-04-27 LAB — CULTURE, BLOOD (ROUTINE X 2)
Culture: NO GROWTH
Culture: NO GROWTH
Special Requests: ADEQUATE
Special Requests: ADEQUATE

## 2021-04-27 LAB — BASIC METABOLIC PANEL
Anion gap: 5 (ref 5–15)
BUN: 21 mg/dL (ref 8–23)
CO2: 39 mmol/L — ABNORMAL HIGH (ref 22–32)
Calcium: 8 mg/dL — ABNORMAL LOW (ref 8.9–10.3)
Chloride: 92 mmol/L — ABNORMAL LOW (ref 98–111)
Creatinine, Ser: 0.85 mg/dL (ref 0.44–1.00)
GFR, Estimated: >60 mL/min (ref >60)
Glucose, Bld: 153 mg/dL — ABNORMAL HIGH (ref 70–99)
Potassium: 4.8 mmol/L (ref 3.5–5.1)
Sodium: 136 mmol/L (ref 135–145)

## 2021-04-27 LAB — GLUCOSE, CAPILLARY
Glucose-Capillary: 129 mg/dL — ABNORMAL HIGH (ref 70–99)
Glucose-Capillary: 156 mg/dL — ABNORMAL HIGH (ref 70–99)
Glucose-Capillary: 167 mg/dL — ABNORMAL HIGH (ref 70–99)
Glucose-Capillary: 130 mg/dL — ABNORMAL HIGH (ref 70–99)
Glucose-Capillary: 176 mg/dL — ABNORMAL HIGH (ref 70–99)

## 2021-04-27 LAB — VALPROIC ACID LEVEL: Valproic Acid Lvl: 69 ug/mL (ref 50.0–100.0)

## 2021-04-27 LAB — PHOSPHORUS: Phosphorus: 3.3 mg/dL (ref 2.5–4.6)

## 2021-04-27 LAB — MAGNESIUM: Magnesium: 2 mg/dL (ref 1.7–2.4)

## 2021-04-27 MED ORDER — INSULIN ASPART 100 UNIT/ML IJ SOLN
0.0000 [IU] | Freq: Three times a day (TID) | INTRAMUSCULAR | Status: DC
  Administered 2021-04-27: 3 [IU] via SUBCUTANEOUS
  Administered 2021-04-27: 2 [IU] via SUBCUTANEOUS
  Administered 2021-04-28: 5 [IU] via SUBCUTANEOUS
  Administered 2021-04-28: 2 [IU] via SUBCUTANEOUS
  Administered 2021-04-28: 3 [IU] via SUBCUTANEOUS
  Administered 2021-04-28 – 2021-04-29 (×3): 5 [IU] via SUBCUTANEOUS
  Administered 2021-04-29: 2 [IU] via SUBCUTANEOUS
  Administered 2021-04-29 – 2021-04-30 (×2): 3 [IU] via SUBCUTANEOUS
  Administered 2021-04-30: 5 [IU] via SUBCUTANEOUS
  Administered 2021-04-30: 3 [IU] via SUBCUTANEOUS
  Administered 2021-05-01: 5 [IU] via SUBCUTANEOUS
  Administered 2021-05-01: 3 [IU] via SUBCUTANEOUS
  Administered 2021-05-01: 5 [IU] via SUBCUTANEOUS
  Administered 2021-05-01: 3 [IU] via SUBCUTANEOUS
  Administered 2021-05-02: 5 [IU] via SUBCUTANEOUS
  Administered 2021-05-02: 3 [IU] via SUBCUTANEOUS
  Administered 2021-05-02: 5 [IU] via SUBCUTANEOUS
  Administered 2021-05-02: 3 [IU] via SUBCUTANEOUS
  Administered 2021-05-03 (×3): 5 [IU] via SUBCUTANEOUS
  Administered 2021-05-03: 3 [IU] via SUBCUTANEOUS
  Administered 2021-05-04: 5 [IU] via SUBCUTANEOUS
  Administered 2021-05-04 – 2021-05-05 (×4): 3 [IU] via SUBCUTANEOUS

## 2021-04-27 MED ORDER — PANTOPRAZOLE SODIUM 40 MG PO TBEC
40.0000 mg | DELAYED_RELEASE_TABLET | Freq: Every day | ORAL | Status: DC
  Administered 2021-04-28 – 2021-05-05 (×8): 40 mg via ORAL
  Filled 2021-04-27 (×8): qty 1

## 2021-04-27 MED ORDER — ADULT MULTIVITAMIN W/MINERALS CH
1.0000 | ORAL_TABLET | Freq: Every day | ORAL | Status: DC
  Administered 2021-04-28 – 2021-05-05 (×8): 1 via ORAL
  Filled 2021-04-27 (×8): qty 1

## 2021-04-27 MED ORDER — PROSOURCE PLUS PO LIQD
30.0000 mL | Freq: Two times a day (BID) | ORAL | Status: DC
  Administered 2021-04-27 – 2021-05-04 (×12): 30 mL via ORAL
  Filled 2021-04-27 (×15): qty 30

## 2021-04-27 NOTE — Progress Notes (Signed)
PROGRESS NOTE    Becky Hendricks  OIN:[id number] DOB: June 13, 1946 DOA: 04/21/2021 PCP: Becky Scarce, MD    Brief Narrative:   Becky Hendricks is a 75 year old female with past medical history significant for chronic respiratory failure/OSA noncompliant with CPAP/BiPAP, diabetes mellitus type 2 essential hypertension, paroxysmal atrial fibrillation on Eliquis, bipolar disorder, morbid obesity who presented from SNF on 9/13 with altered mental status. She was noted to be obtunded with her BiPAP being off.  She does have a history of multiple recent hospitalizations.  In the ED, patient was noted to have hypercapnic respiratory failure with pH of 7.29, PaCO2 100, PaO2 71.  PCCM was consulted and patient was admitted to hospital.  Assessment & Plan:   Active Problems:   Pressure injury of skin   Acute on chronic respiratory failure with hypoxia and hypercapnia (HCC)   Acute and chronic respiratory failure with hypercapnia (HCC)   Respiratory failure (HCC)  Acute on chronic hypoxic/hypercapnic respiratory failure, POA Acute metabolic encephalopathy, POA Patient has BiPAP at the skilled nursing facility but was likely noncompliant.  Multiple hospitalizations and intubation in the past.  Palliative care has been consulted.  ABG from 04/27/2020 showed PCO2 of 73.  Patient has very poor prognosis given noncompliance.  Continue with BiPAP.  Since patient appears to be lethargic today.  Hypotension Was on Levophed, midodrine has been initiated.  PCCM on board.  Need to restart Levophed if remains hypotensive.  Acute on chronic diastolic congestive heart failure -Strict intake and output charting, daily weights, closely monitor.  Beta-blocker on hold.  Needed Levophed,  On midodrine.  Hypokalemia Improved after replacement.  Monitor in AM.  Elevated ammonia level Continue lactulose  Paroxysmal atrial fibrillation with RVR Normal TSH.  Initially was on Cardizem which has been discontinued due to  hypotension..  On Eliquis.  Due to hypotension amiodarone drip was initiated.  Continue amiodarone drip until mentation is improved then changed to p.o.  Hyperlipidemia  continue Lipitor  Bipolar disorder Psychiatry on board, continue Depakote risperidone and Ativan as needed.    Hx COVID-19 infection April 2021.  GERD: Continue Protonix IV twice daily  Pressure injury, sacral stage II ulceration, lumbar right deep tissue injury, nasal stage II pressure injury.  Present on admission.  Continue supportive care, wound care  Pressure Injury 04/21/21 Sacrum Medial Stage 2 -  Partial thickness loss of dermis presenting as a shallow open injury with a red, pink wound bed without slough. yellow, pink, craterous  04/25/21; updated staging, healed, samll opening (Active)  04/21/21 1720  Location: Sacrum  Location Orientation: Medial  Staging: Stage 2 -  Partial thickness loss of dermis presenting as a shallow open injury with a red, pink wound bed without slough.  Wound Description (Comments): yellow, pink, craterous  04/25/21; updated staging, healed, samll opening  Present on Admission: Yes     Pressure Injury 04/24/21 Lumbar Right Deep Tissue Pressure Injury - Purple or maroon localized area of discolored intact skin or blood-filled blister due to damage of underlying soft tissue from pressure and/or shear. (Active)  04/24/21 2000  Location: Lumbar  Location Orientation: Right  Staging: Deep Tissue Pressure Injury - Purple or maroon localized area of discolored intact skin or blood-filled blister due to damage of underlying soft tissue from pressure and/or shear.  Wound Description (Comments):   Present on Admission:      Pressure Injury Nose  Stage 2 -  Partial thickness loss of dermis presenting as a shallow open injury with a red,  pink wound bed without slough. scab (Active)     Location: Nose  Location Orientation: -- (bridge of nose)  Staging: Stage 2 -  Partial thickness loss of  dermis presenting as a shallow open injury with a red, pink wound bed without slough.  Wound Description (Comments): scab  Present on Admission:      Goals of care: Palliative care has been consulted due to recurrent hospitalizations, noncompliance with treatment for goals of care.  Patient's brother is supposed to come into the hospital.  DVT prophylaxis: Lovenox    Code Status: Full Code  Family Communication:  None today.  Disposition Plan:   Status is: Inpatient  Remains inpatient appropriate because: Altered mental status, Unsafe d/c plan, IV treatments appropriate due to intensity of illness or inability to take PO, and Inpatient level of care appropriate due to severity of illness  Dispo: The patient is from: SNF              Anticipated d/c is to: SNF              Patient currently is not medically stable to d/c.   Difficult to place patient No  Consultants:  PCCM Cardiology -signed off 9/12  Procedures:  BiPAP  Antimicrobials:  None   Subjective: Today, patient was seen and examined at bedside.  Patient appears to be lethargic on BiPAP.  Opens eyes when on command but appears sleepy. Nursing staff reported hypotension.  Objective: Vitals:   04/27/21 0530 04/27/21 0545 04/27/21 0600 04/27/21 0615  BP: (!) 123/54 (!) 123/51 (!) 125/56 (!) 116/47  Pulse: 67 64 68 70  Resp: (!) 24 17 13 11   Temp:      TempSrc:      SpO2: 96% 97% 98% 96%  Weight:      Height:        Intake/Output Summary (Last 24 hours) at 04/27/2021 0736 Last data filed at 04/27/2021 0500 Gross per 24 hour  Intake 1041.93 ml  Output 450 ml  Net 591.93 ml    Filed Weights   04/25/21 0550 04/27/21 0353  Weight: 98.4 kg 101.5 kg    Physical examination:  General: Lethargic, on BiPAP, HENT:   No scleral pallor or icterus noted. Oral mucosa is moist.  Chest:  .  Diminished breath sounds bilaterally. No crackles or wheezes.  CVS: S1 &S2 heard. No murmur.  Irregular irregular  rhythm. Abdomen: Soft, nontender, nondistended.  Bowel sounds are heard.   Extremities: No cyanosis, clubbing or edema.  Peripheral pulses are palpable. Psych: Lethargic, BiPAP CNS: Lethargic, on BiPAP Skin: Warm and dry.  No rashes noted.  Data Reviewed: I have personally reviewed the following labs and imaging studies   CBC: Recent Labs  Lab 04/22/21 0912 04/23/21 0336 04/24/21 1051 04/25/21 0500 04/25/21 0626 04/26/21 0159 04/26/21 0853 04/27/21 0434  WBC 6.6 7.1 8.7 8.0  --  7.4  --   --   NEUTROABS 3.6 3.5 6.1 4.7  --  4.0  --   --   HGB 9.7* 10.5* 11.1* 10.1* 10.9* 10.9* 10.9* 10.5*  HCT 31.9* 33.6* 36.9 34.3* 32.0* 36.8 32.0* 31.0*  MCV 92.5 91.6 94.1 95.8  --  94.4  --   --   PLT 137* 148* 132* 114*  --  117*  --   --     Basic Metabolic Panel: Recent Labs  Lab 04/23/21 0336 04/24/21 1051 04/25/21 0500 04/25/21 0626 04/26/21 0159 04/26/21 0853 04/27/21 0140 04/27/21 0434  NA 139  142 140 141 138 137 136 135  K 3.6 3.6 3.6 3.6 3.2* 3.7 4.8 4.5  CL 92* 94* 90*  --  88*  --  92*  --   CO2 36* 38* 42*  --  40*  --  39*  --   GLUCOSE 164* 165* 119*  --  199*  --  153*  --   BUN 16 19 20   --  21  --  21  --   CREATININE 0.74 0.74 0.64  --  0.60  --  0.85  --   CALCIUM 8.6* 8.6* 8.6*  --  8.5*  --  8.0*  --   MG 1.9 2.1 2.2  --  2.1  --  2.0  --   PHOS 3.5 4.5 3.8  --  2.3*  --  3.3  --     GFR: Estimated Creatinine Clearance: 70.9 mL/min (by C-G formula based on SCr of 0.85 mg/dL). Liver Function Tests: Recent Labs  Lab 04/22/21 0912 04/23/21 0336 04/24/21 1051 04/25/21 0500 04/26/21 0159  AST 8* 8* 9* 8* 10*  ALT 7 7 9 8 11   ALKPHOS 33* 33* 30* 28* 31*  BILITOT 0.8 0.3 1.1 0.9 0.7  PROT 5.0* 5.3* 5.4* 5.0* 5.3*  ALBUMIN 2.2* 2.3* 2.3* 2.2* 2.2*    No results for input(s): LIPASE, AMYLASE in the last 168 hours. Recent Labs  Lab 04/22/21 1658 04/23/21 0851  AMMONIA 101* 23    Coagulation Profile: No results for input(s): INR, PROTIME in  the last 168 hours. Cardiac Enzymes: No results for input(s): CKTOTAL, CKMB, CKMBINDEX, TROPONINI in the last 168 hours. BNP (last 3 results) No results for input(s): PROBNP in the last 8760 hours. HbA1C: No results for input(s): HGBA1C in the last 72 hours. CBG: Recent Labs  Lab 04/26/21 1119 04/26/21 1538 04/26/21 1935 04/26/21 2315 04/27/21 0351  GLUCAP 164* 181* 189* 152* 129*    Lipid Profile: No results for input(s): CHOL, HDL, LDLCALC, TRIG, CHOLHDL, LDLDIRECT in the last 72 hours. Thyroid Function Tests: No results for input(s): TSH, T4TOTAL, FREET4, T3FREE, THYROIDAB in the last 72 hours. Anemia Panel: No results for input(s): VITAMINB12, FOLATE, FERRITIN, TIBC, IRON, RETICCTPCT in the last 72 hours. Sepsis Labs: Recent Labs  Lab 04/25/21 0505  PROCALCITON <0.10     Recent Results (from the past 240 hour(s))  Resp Panel by RT-PCR (Flu A&B, Covid) Nasopharyngeal Swab     Status: None   Collection Time: 04/18/21 12:18 PM   Specimen: Nasopharyngeal Swab; Nasopharyngeal(NP) swabs in vial transport medium  Result Value Ref Range Status   SARS Coronavirus 2 by RT PCR NEGATIVE NEGATIVE Final    Comment: (NOTE) SARS-CoV-2 target nucleic acids are NOT DETECTED.  The SARS-CoV-2 RNA is generally detectable in upper respiratory specimens during the acute phase of infection. The lowest concentration of SARS-CoV-2 viral copies this assay can detect is 138 copies/mL. A negative result does not preclude SARS-Cov-2 infection and should not be used as the sole basis for treatment or other patient management decisions. A negative result may occur with  improper specimen collection/handling, submission of specimen other than nasopharyngeal swab, presence of viral mutation(s) within the areas targeted by this assay, and inadequate number of viral copies(<138 copies/mL). A negative result must be combined with clinical observations, patient history, and  epidemiological information. The expected result is Negative.  Fact Sheet for Patients:  04/27/21  Fact Sheet for Healthcare Providers:  06/18/21  This test is no t yet approved or  cleared by the [place] and  has been authorized for detection and/or diagnosis of SARS-CoV-2 by FDA under an Emergency Use Authorization (EUA). This EUA will remain  in effect (meaning this test can be used) for the duration of the COVID-19 declaration under Section 564(b)(1) of the Act, 21 U.S.C.section 360bbb-3(b)(1), unless the authorization is terminated  or revoked sooner.       Influenza A by PCR NEGATIVE NEGATIVE Final   Influenza B by PCR NEGATIVE NEGATIVE Final    Comment: (NOTE) The Xpert Xpress SARS-CoV-2/FLU/RSV plus assay is intended as an aid in the diagnosis of influenza from Nasopharyngeal swab specimens and should not be used as a sole basis for treatment. Nasal washings and aspirates are unacceptable for Xpert Xpress SARS-CoV-2/FLU/RSV testing.  Fact Sheet for Patients: BloggerCourse.com  Fact Sheet for Healthcare Providers: SeriousBroker.it  This test is not yet approved or cleared by the [place] FDA and has been authorized for detection and/or diagnosis of SARS-CoV-2 by FDA under an Emergency Use Authorization (EUA). This EUA will remain in effect (meaning this test can be used) for the duration of the COVID-19 declaration under Section 564(b)(1) of the Act, 21 U.S.C. section 360bbb-3(b)(1), unless the authorization is terminated or revoked.  Performed at [facility], [address] [place], [place] [address]   Resp Panel by RT-PCR (Flu A&B, Covid) Nasopharyngeal Swab     Status: None   Collection Time: 04/21/21 10:18 AM   Specimen: Nasopharyngeal Swab; Nasopharyngeal(NP) swabs in vial transport medium  Result Value Ref Range Status    SARS Coronavirus 2 by RT PCR NEGATIVE NEGATIVE Final    Comment: (NOTE) SARS-CoV-2 target nucleic acids are NOT DETECTED.  The SARS-CoV-2 RNA is generally detectable in upper respiratory specimens during the acute phase of infection. The lowest concentration of SARS-CoV-2 viral copies this assay can detect is 138 copies/mL. A negative result does not preclude SARS-Cov-2 infection and should not be used as the sole basis for treatment or other patient management decisions. A negative result may occur with  improper specimen collection/handling, submission of specimen other than nasopharyngeal swab, presence of viral mutation(s) within the areas targeted by this assay, and inadequate number of viral copies(<138 copies/mL). A negative result must be combined with clinical observations, patient history, and epidemiological information. The expected result is Negative.  Fact Sheet for Patients:  BloggerCourse.com  Fact Sheet for Healthcare Providers:  SeriousBroker.it  This test is no t yet approved or cleared by the [place] FDA and  has been authorized for detection and/or diagnosis of SARS-CoV-2 by FDA under an Emergency Use Authorization (EUA). This EUA will remain  in effect (meaning this test can be used) for the duration of the COVID-19 declaration under Section 564(b)(1) of the Act, 21 U.S.C.section 360bbb-3(b)(1), unless the authorization is terminated  or revoked sooner.       Influenza A by PCR NEGATIVE NEGATIVE Final   Influenza B by PCR NEGATIVE NEGATIVE Final    Comment: (NOTE) The Xpert Xpress SARS-CoV-2/FLU/RSV plus assay is intended as an aid in the diagnosis of influenza from Nasopharyngeal swab specimens and should not be used as a sole basis for treatment. Nasal washings and aspirates are unacceptable for Xpert Xpress SARS-CoV-2/FLU/RSV testing.  Fact Sheet for  Patients: BloggerCourse.com  Fact Sheet for Healthcare Providers: SeriousBroker.it  This test is not yet approved or cleared by the [place] FDA and has been authorized for detection and/or diagnosis of SARS-CoV-2 by FDA under an Emergency  Use Authorization (EUA). This EUA will remain in effect (meaning this test can be used) for the duration of the COVID-19 declaration under Section 564(b)(1) of the Act, 21 U.S.C. section 360bbb-3(b)(1), unless the authorization is terminated or revoked.  Performed at [facility], [address] [place], [place] [address]   Culture, blood (routine x 2)     Status: None (Preliminary result)   Collection Time: 04/22/21  4:58 PM   Specimen: BLOOD RIGHT HAND  Result Value Ref Range Status   Specimen Description BLOOD RIGHT HAND  Final   Special Requests   Final    BOTTLES DRAWN AEROBIC ONLY Blood Culture adequate volume   Culture   Final    NO GROWTH 4 DAYS Performed at [facility], [address] [place], [place] [address]    Report Status PENDING  Incomplete  Culture, blood (routine x 2)     Status: None (Preliminary result)   Collection Time: 04/22/21  5:07 PM   Specimen: BLOOD  Result Value Ref Range Status   Specimen Description BLOOD LEFT ANTECUBITAL  Final   Special Requests   Final    BOTTLES DRAWN AEROBIC ONLY Blood Culture adequate volume   Culture   Final    NO GROWTH 4 DAYS Performed at [facility], [address] [place], [place] [address]    Report Status PENDING  Incomplete  MRSA Next Gen by PCR, Nasal     Status: Abnormal   Collection Time: 04/25/21  5:54 AM   Specimen: Nasal Mucosa; Nasal Swab  Result Value Ref Range Status   MRSA by PCR Next Gen DETECTED (A) NOT DETECTED Final    Comment: RESULT CALLED TO, READ BACK BY AND VERIFIED WITH: RN MICHELLE TOLLER 04/25/21@7 :05 BY TW (NOTE) The GeneXpert MRSA Assay (FDA approved for NASAL specimens  only), is one component of a comprehensive MRSA colonization surveillance program. It is not intended to diagnose MRSA infection nor to guide or monitor treatment for MRSA infections. Test performance is not FDA approved in patients less than 35 years old. Performed at [facility], [address], [place], [place] [address]      Radiology Studies: DG CHEST PORT 1 VIEW  Result Date: 04/26/2021 CLINICAL DATA:  Shortness of breath EXAM: PORTABLE CHEST 1 VIEW COMPARISON:  04/25/2021 FINDINGS: Heart is borderline in size. Bilateral lower lobe airspace opacities and probable layering effusions. No acute bony abnormality. IMPRESSION: Layering bilateral effusions with bibasilar atelectasis or infiltrates. Worsening aeration at the left base since prior study. Electronically Signed   By: Charlett Nose M.D.   On: 04/26/2021 08:50      Scheduled Meds:  divalproex  750 mg Oral Q12H   enoxaparin (LOVENOX) injection  95 mg Subcutaneous Q12H   insulin aspart  0-15 Units Subcutaneous Q4H   mouth rinse  15 mL Mouth Rinse BID   midodrine  10 mg Oral TID   mupirocin ointment  1 application Nasal BID   pantoprazole (PROTONIX) IV  40 mg Intravenous Q24H   polyethylene glycol  17 g Oral Daily   risperiDONE  2 mg Oral BID   Continuous Infusions:  sodium chloride Stopped (04/26/21 1647)   amiodarone 30 mg/hr (04/27/21 0500)   norepinephrine (LEVOPHED) Adult infusion Stopped (04/26/21 1735)     LOS: 6 days    Joycelyn Das, MD [facility] Available via Epic secure chat 7am-7pm After these hours, please refer to coverage provider listed on [email or web address] 04/27/2021, 7:36  AM

## 2021-04-27 NOTE — Consult Note (Signed)
Reason for Consult: Severe Hallucinations, Paranoia, Bipolar Disorder and labile mood Referring Physician: Merlene Laughter, DO  Becky Hendricks is an 75 y.o. female.  HPI:  Becky Hendricks is a 75 yr old female with a history of chronic hypoxic hypercapnic respiratory failure secondary to OSA, noncompliant with BiPAP at night, morbid obesity, IDDM, HTN, PAF on Eliquis, bipolar disorder, sent from nursing home for AMS. Recently discharged from the hospital on 8/26 and 9/7 after admissions stemming from acute hypoxic and hypercarbic respiratory failure due to her refusal to wear a BiPAP at night.   She reports that she is doing fine today.  She reports that she slept okay.  She reports that she is eating okay.  She reports no SI, HI, or AVH.  She reports that she feels like her medications are doing well for her with no side effects.  She reports she does not need anything to be changed psychiatrically speaking at this time.  She then asked to any interviews that she can go back to sleep.  She raised no other concerns at this time.   Past Medical History:  Diagnosis Date   Atrial fibrillation (HCC)    Bipolar disorder (HCC)    Bipolar disorder (HCC) 04/27/2020   Clostridium difficile diarrhea 08/02/2019   Dehydration    Essential hypertension    Fall 02/11/2020   Morbid obesity (HCC)    Osteomyelitis (HCC) 12/03/2019   Pneumonia due to COVID-19 virus 09/15/2019   Type 2 diabetes mellitus (HCC)    Weakness     Past Surgical History:  Procedure Laterality Date   INCISION AND DRAINAGE PERIRECTAL ABSCESS Left 12/04/2019   Procedure: IRRIGATION AND DEBRIDEMENT LEFT BUTTOCK ABSCESS;  Surgeon: Violeta Gelinas, MD;  Location: [facility] OR;  Service: General;  Laterality: Left;   INCISION AND DRAINAGE PERIRECTAL ABSCESS Left 12/10/2019   Procedure: REPEAT IRRIGATION AND DEBRIDEMENT BUTTOCK  ABSCESS;  Surgeon: Abigail Miyamoto, MD;  Location: [facility] OR;  Service: General;  Laterality: Left;   IR FLUORO GUIDE CV  LINE RIGHT  11/25/2020   IR RADIOLOGIST EVAL & MGMT  12/29/2020   IR US GUIDE VASC ACCESS RIGHT  11/25/2020    Family History  Family history unknown: Yes    Social History:  reports that she has never smoked. She has never used smokeless tobacco. She reports that she does not currently use alcohol. She reports that she does not currently use drugs.  Allergies:  Allergies  Allergen Reactions   Chlorhexidine Gluconate Itching   Vancomycin     Vancomycin infusion related reaction- May need Vanc to be given at a slower rate    Acetazolamide Er Rash    Medications: I have reviewed the patient's current medications.  Results for orders placed or performed during the hospital encounter of 04/21/21 (from the past 48 hour(s))  Glucose, capillary     Status: Abnormal   Collection Time: 04/25/21 11:14 AM  Result Value Ref Range   Glucose-Capillary 114 (H) 70 - 99 mg/dL    Comment: Glucose reference range applies only to samples taken after fasting for at least 8 hours.  Glucose, capillary     Status: Abnormal   Collection Time: 04/25/21  5:15 PM  Result Value Ref Range   Glucose-Capillary 169 (H) 70 - 99 mg/dL    Comment: Glucose reference range applies only to samples taken after fasting for at least 8 hours.  Urinalysis, Routine w reflex microscopic     Status: Abnormal   Collection Time: 04/25/21  6:15 PM  Result Value Ref Range   Color, Urine YELLOW YELLOW   APPearance CLEAR CLEAR   Specific Gravity, Urine 1.032 (H) 1.005 - 1.030   pH 6.0 5.0 - 8.0   Glucose, UA >=500 (A) NEGATIVE mg/dL   Hgb urine dipstick NEGATIVE NEGATIVE   Bilirubin Urine NEGATIVE NEGATIVE   Ketones, ur 20 (A) NEGATIVE mg/dL   Protein, ur NEGATIVE NEGATIVE mg/dL   Nitrite NEGATIVE NEGATIVE   Leukocytes,Ua NEGATIVE NEGATIVE   RBC / HPF 0-5 0 - 5 RBC/hpf   WBC, UA 0-5 0 - 5 WBC/hpf   Bacteria, UA NONE SEEN NONE SEEN   Squamous Epithelial / LPF 0-5 0 - 5    Comment: Performed at [facility],  [address], [place], [place] [address]  Glucose, capillary     Status: Abnormal   Collection Time: 04/25/21  8:28 PM  Result Value Ref Range   Glucose-Capillary 138 (H) 70 - 99 mg/dL    Comment: Glucose reference range applies only to samples taken after fasting for at least 8 hours.  Glucose, capillary     Status: Abnormal   Collection Time: 04/25/21 11:14 PM  Result Value Ref Range   Glucose-Capillary 128 (H) 70 - 99 mg/dL    Comment: Glucose reference range applies only to samples taken after fasting for at least 8 hours.  CBC with Differential/Platelet     Status: Abnormal   Collection Time: 04/26/21  1:59 AM  Result Value Ref Range   WBC 7.4 4.0 - 10.5 K/uL   RBC 3.90 3.87 - 5.11 MIL/uL   Hemoglobin 10.9 (L) 12.0 - 15.0 g/dL   HCT 59.9 35.7 - 01.7 %   MCV 94.4 80.0 - 100.0 fL   MCH 27.9 26.0 - 34.0 pg   MCHC 29.6 (L) 30.0 - 36.0 g/dL   RDW 79.3 90.3 - 00.9 %   Platelets 117 (L) 150 - 400 K/uL    Comment: Immature Platelet Fraction may be clinically indicated, consider ordering this additional test [id number] CONSISTENT WITH PREVIOUS RESULT REPEATED TO VERIFY    nRBC 0.0 0.0 - 0.2 %   Neutrophils Relative % 54 %   Neutro Abs 4.0 1.7 - 7.7 K/uL   Lymphocytes Relative 24 %   Lymphs Abs 1.7 0.7 - 4.0 K/uL   Monocytes Relative 10 %   Monocytes Absolute 0.8 0.1 - 1.0 K/uL   Eosinophils Relative 11 %   Eosinophils Absolute 0.8 (H) 0.0 - 0.5 K/uL   Basophils Relative 0 %   Basophils Absolute 0.0 0.0 - 0.1 K/uL   Immature Granulocytes 1 %   Abs Immature Granulocytes 0.07 0.00 - 0.07 K/uL    Comment: Performed at [facility], [address] [place], [place] [address]  Comprehensive metabolic panel     Status: Abnormal   Collection Time: 04/26/21  1:59 AM  Result Value Ref Range   Sodium 138 135 - 145 mmol/L   Potassium 3.2 (L) 3.5 - 5.1 mmol/L   Chloride 88 (L) 98 - 111 mmol/L   CO2 40 (H) 22 - 32 mmol/L   Glucose, Bld 199 (H) 70 - 99 mg/dL    Comment: Glucose  reference range applies only to samples taken after fasting for at least 8 hours.   BUN 21 8 - 23 mg/dL   Creatinine, Ser 5.45 0.44 - 1.00 mg/dL   Calcium 8.5 (L) 8.9 - 10.3 mg/dL   Total Protein 5.3 (L) 6.5 - 8.1 g/dL  Albumin 2.2 (L) 3.5 - 5.0 g/dL   AST 10 (L) 15 - 41 U/L   ALT 11 0 - 44 U/L   Alkaline Phosphatase 31 (L) 38 - 126 U/L   Total Bilirubin 0.7 0.3 - 1.2 mg/dL   GFR, Estimated >33 >35 mL/min    Comment: (NOTE) Calculated using the CKD-EPI Creatinine Equation (2021)    Anion gap 10 5 - 15    Comment: Performed at [facility], [address], [place], [place] [address]  Magnesium     Status: None   Collection Time: 04/26/21  1:59 AM  Result Value Ref Range   Magnesium 2.1 1.7 - 2.4 mg/dL    Comment: Performed at [facility], [address], [place], [place] [address]  Phosphorus     Status: Abnormal   Collection Time: 04/26/21  1:59 AM  Result Value Ref Range   Phosphorus 2.3 (L) 2.5 - 4.6 mg/dL    Comment: Performed at [facility], [address], [place], [place] [address]  Glucose, capillary     Status: Abnormal   Collection Time: 04/26/21  3:22 AM  Result Value Ref Range   Glucose-Capillary 287 (H) 70 - 99 mg/dL    Comment: Glucose reference range applies only to samples taken after fasting for at least 8 hours.  Glucose, capillary     Status: Abnormal   Collection Time: 04/26/21  7:51 AM  Result Value Ref Range   Glucose-Capillary 168 (H) 70 - 99 mg/dL    Comment: Glucose reference range applies only to samples taken after fasting for at least 8 hours.  I-STAT 7, (LYTES, BLD GAS, ICA, H+H)     Status: Abnormal   Collection Time: 04/26/21  8:53 AM  Result Value Ref Range   pH, Arterial 7.399 7.350 - 7.450   pCO2 arterial 76.3 (HH) 32.0 - 48.0 mmHg   pO2, Arterial 92 83.0 - 108.0 mmHg   Bicarbonate 47.1 (H) 20.0 - 28.0 mmol/L   TCO2 49 (H) 22 - 32 mmol/L   O2 Saturation 97.0 %   Acid-Base Excess 19.0 (H) 0.0 - 2.0 mmol/L   Sodium 137  135 - 145 mmol/L   Potassium 3.7 3.5 - 5.1 mmol/L   Calcium, Ion 1.16 1.15 - 1.40 mmol/L   HCT 32.0 (L) 36.0 - 46.0 %   Hemoglobin 10.9 (L) 12.0 - 15.0 g/dL   Collection site Radial    Drawn by RT    Sample type ARTERIAL    Comment NOTIFIED PHYSICIAN   Glucose, capillary     Status: Abnormal   Collection Time: 04/26/21 11:19 AM  Result Value Ref Range   Glucose-Capillary 164 (H) 70 - 99 mg/dL    Comment: Glucose reference range applies only to samples taken after fasting for at least 8 hours.  Glucose, capillary     Status: Abnormal   Collection Time: 04/26/21  3:38 PM  Result Value Ref Range   Glucose-Capillary 181 (H) 70 - 99 mg/dL    Comment: Glucose reference range applies only to samples taken after fasting for at least 8 hours.  Glucose, capillary     Status: Abnormal   Collection Time: 04/26/21  7:35 PM  Result Value Ref Range   Glucose-Capillary 189 (H) 70 - 99 mg/dL    Comment: Glucose reference range applies only to samples taken after fasting for at least 8 hours.  Glucose, capillary     Status: Abnormal   Collection Time: 04/26/21  11:15 PM  Result Value Ref Range   Glucose-Capillary 152 (H) 70 - 99 mg/dL    Comment: Glucose reference range applies only to samples taken after fasting for at least 8 hours.  Basic metabolic panel     Status: Abnormal   Collection Time: 04/27/21  1:40 AM  Result Value Ref Range   Sodium 136 135 - 145 mmol/L   Potassium 4.8 3.5 - 5.1 mmol/L    Comment: NO VISIBLE HEMOLYSIS   Chloride 92 (L) 98 - 111 mmol/L   CO2 39 (H) 22 - 32 mmol/L   Glucose, Bld 153 (H) 70 - 99 mg/dL    Comment: Glucose reference range applies only to samples taken after fasting for at least 8 hours.   BUN 21 8 - 23 mg/dL   Creatinine, Ser 9.62 0.44 - 1.00 mg/dL   Calcium 8.0 (L) 8.9 - 10.3 mg/dL   GFR, Estimated >95 >28 mL/min    Comment: (NOTE) Calculated using the CKD-EPI Creatinine Equation (2021)    Anion gap 5 5 - 15    Comment: Performed at [facility], [address] [place], [place] [address]  Phosphorus     Status: None   Collection Time: 04/27/21  1:40 AM  Result Value Ref Range   Phosphorus 3.3 2.5 - 4.6 mg/dL    Comment: Performed at [facility], [address] [place], [place] [address]  Magnesium     Status: None   Collection Time: 04/27/21  1:40 AM  Result Value Ref Range   Magnesium 2.0 1.7 - 2.4 mg/dL    Comment: Performed at [facility], [address], [place], [place] [address]  Glucose, capillary     Status: Abnormal   Collection Time: 04/27/21  3:51 AM  Result Value Ref Range   Glucose-Capillary 129 (H) 70 - 99 mg/dL    Comment: Glucose reference range applies only to samples taken after fasting for at least 8 hours.  I-STAT 7, (LYTES, BLD GAS, ICA, H+H)     Status: Abnormal   Collection Time: 04/27/21  4:34 AM  Result Value Ref Range   pH, Arterial 7.399 7.350 - 7.450   pCO2 arterial 73.3 (HH) 32.0 - 48.0 mmHg   pO2, Arterial 102 83.0 - 108.0 mmHg   Bicarbonate 44.8 (H) 20.0 - 28.0 mmol/L   TCO2 47 (H) 22 - 32 mmol/L   O2 Saturation 97.0 %   Acid-Base Excess 17.0 (H) 0.0 - 2.0 mmol/L   Sodium 135 135 - 145 mmol/L   Potassium 4.5 3.5 - 5.1 mmol/L   Calcium, Ion 1.11 (L) 1.15 - 1.40 mmol/L   HCT 31.0 (L) 36.0 - 46.0 %   Hemoglobin 10.5 (L) 12.0 - 15.0 g/dL   Patient temperature 644.0 F    Sample type ARTERIAL    Comment NOTIFIED PHYSICIAN   Glucose, capillary     Status: Abnormal   Collection Time: 04/27/21  7:49 AM  Result Value Ref Range   Glucose-Capillary 156 (H) 70 - 99 mg/dL    Comment: Glucose reference range applies only to samples taken after fasting for at least 8 hours.    DG CHEST PORT 1 VIEW  Result Date: 04/26/2021 CLINICAL DATA:  Shortness of breath EXAM: PORTABLE CHEST 1 VIEW COMPARISON:  04/25/2021 FINDINGS: Heart is borderline in size. Bilateral lower lobe airspace opacities and probable layering effusions. No acute bony abnormality. IMPRESSION: Layering bilateral  effusions with bibasilar atelectasis or infiltrates. Worsening aeration  at the left base since prior study. Electronically Signed   By: Charlett Nose M.D.   On: 04/26/2021 08:50    Review of Systems  Respiratory:  Negative for cough.   Cardiovascular:  Negative for chest pain.  Gastrointestinal:  Negative for abdominal pain, constipation, diarrhea, nausea and vomiting.  Neurological:  Negative for headaches.  Psychiatric/Behavioral:  Negative for hallucinations and suicidal ideas. The patient is not nervous/anxious.   Blood pressure (!) 106/42, pulse 66, temperature 98.6 F (37 C), temperature source Oral, resp. rate 15, height 5\' 6"  (1.676 m), weight 101.5 kg, SpO2 98 %. Physical Exam Vitals and nursing note reviewed.  Constitutional:      General: She is not in acute distress.    Appearance: She is obese. She is ill-appearing. She is not toxic-appearing.  HENT:     Head: Normocephalic and atraumatic.  Pulmonary:     Comments: On nasal cannula 6 L/min Musculoskeletal:     Comments: Able to move all limbs to command  Neurological:     General: No focal deficit present.     Mental Status: She is alert.    04/27/21 0926  Presentation  General Appearance Appropriate for Environment;Disheveled  Eye Contact Fair  Speech Clear and Coherent;Slow  Speech Volume Normal  Mood and Affect  Mood Anxious;Dysphoric;Irritable  Affect Depressed  Thought Processes  Thought Process Coherent;Linear  Descriptions of Associations Intact  Orientation Partial  Hallucinations None  Ideas of Reference None  Suicidal Thoughts No  Homicidal Thoughts No  Sensorium  Memory Immediate Fair;Recent Fair  Judgment Impaired  Insight Lacking  Executive Functions  Concentration Fair  Attention Span Fair  Recall 04/29/21 of Knowledge Fair  Language Fair  Psychomotor Activity  Psychomotor Activity Normal  Assets  Assets Communication Skills;Resilience  Sleep  Sleep Fair    Assessment/Plan: Case  and Plan discussed with Dr. [employer]  Mrs. Ruedas is a 75 yr old female with a history of chronic hypoxic hypercapnic respiratory failure secondary to OSA, noncompliant with BiPAP at night, morbid obesity, IDDM, HTN, PAF on Eliquis, bipolar disorder, sent from nursing home for AMS. Recently discharged from the hospital on 8/26 and 9/7 after admissions stemming from acute hypoxic and hypercarbic respiratory failure due to her refusal to wear a BiPAP at night.      From a psychiatric standpoint patient appears to be stable.  She is still irritable and demanding of nursing staff but this appears to be near her baseline.  There has been some improvement of her wearing the BiPAP mask but this still continues to be her biggest issue and per ICU notes there is still discussion about potential intubation.  Would not recommend any medication changes at this time.  We will continue to monitor.     Bipolar Disorder: -Continue Depakote 750 mg BID -Continue Risperdal M-tabs 2 mg BID      Continue rest of care per Primary Team Psychiatry to continue to follow     Disposition: No evidence of imminent risk to self or others at present. Does not meet criteria for inpatient treatment.    11/7 04/27/2021, 8:18 AM

## 2021-04-27 NOTE — Progress Notes (Addendum)
RT note. Patient taken off bipap at this time per RN, patient not tolerating well at this time. Patient placed back on [place] sat 98%. RT will continue to monitor.    1113-Patient placed back on bipap

## 2021-04-27 NOTE — Progress Notes (Addendum)
Nutrition Follow-up  DOCUMENTATION CODES:   Obesity unspecified  INTERVENTION:   -MVI with minerals daily -30 ml Prosource Plus BID, each supplement provides 100 kcals and 15 grams protein -Magic cup BID with meals, each supplement provides 290 kcal and 9 grams of protein   NUTRITION DIAGNOSIS:   Increased nutrient needs related to wound healing as evidenced by estimated needs.  Ongoing  GOAL:   Patient will meet greater than or equal to 90% of their needs  Progressing   MONITOR:   PO intake, Supplement acceptance, Skin  REASON FOR ASSESSMENT:   Consult COPD Protocol  ASSESSMENT:   75 yo female admitted from SNF with AMS. PMH includes chronic respiratory failure d/t OSA, noncompliant with BiPAP at night, IDDM, HTN, PAF, bipolar disorder.  9/13- rapid response secondary to decreased LOC and hypotension  Reviewed I/O's: +625 ml x 24 hours and +560 ml since admission  UOP: 450 ml x 24 hours   Pt unavailable at time of visit.   Pt with non-compliance with Bi-pap and poor prognosis. Palliative care following for goals of care discussions; possible family meeting today with pt brother.   Pt with good appetite. Noted meal completions 75-100%. She remains on a heart healthy/ carb modified diet.   Per pharmacist, acceptance of PO medications have been inconsistent and inquired about cortrak; RD will hold off on placement until goals of care have been established.   Per TOC notes, plan to d/c back to [facility] once medically stable for discharge.   Medications reviewed and include miralax and amiodarone.  Labs reviewed: CBGS: 129-156 (inpatient orders for glycemic control are 0-15 units insulin aspart every 4 hours).    Diet Order:   Diet Order             Diet heart healthy/carb modified Room service appropriate? Yes; Fluid consistency: Thin  Diet effective now                   EDUCATION NEEDS:   Not appropriate for education at this  time  Skin:  Skin Assessment: Skin Integrity Issues: Skin Integrity Issues:: Other (Comment), Stage II, DTI DTI: rt lumbar Stage II: sacrum, nose Unstageable: - Other: IAD labia  Last BM:  04/27/21 (type 6)  Height:   Ht Readings from Last 1 Encounters:  04/24/21 5\' 6"  (1.676 m)    Weight:   Wt Readings from Last 1 Encounters:  04/27/21 101.5 kg   BMI:  Body mass index is 36.12 kg/m.  Estimated Nutritional Needs:   Kcal:  1800-2000  Protein:  110-130 gm  Fluid:  >/= 1.8 L    04/29/21, RD, LDN, CDCES Registered Dietitian II Certified Diabetes Care and Education Specialist Please refer to [facility] for RD and/or RD on-call/weekend/after hours pager

## 2021-04-27 NOTE — Consult Note (Addendum)
Palliative Care Consult Note                                  Date: 04/27/2021   Patient Name: Becky Hendricks  DOB: 12-23-1945  MRN: [id number]  Age / Sex: 75 y.o., female  PCP: [name], MD Referring Physician: Flora Lipps, MD  Reason for Consultation: Establishing goals of care  HPI/Patient Profile: Palliative Care consult requested for goals of care discussion in this 75 y.o. female  with past medical history of  atrial fibrillation (Eliquis), OSA (noncompliant with BiPAP), dCHF, bipolar disorder, obesity, diabetes, osteomyelitis, and COVID-19. She was admitted on 04/21/2021 from [facility] facility with altered mental status. Patient hospitalized multiple times over the past month for similar presentation (hypoxic respiratory failure)  due to no interest in wearing BiPAP.   Past Medical History:  Diagnosis Date   Atrial fibrillation ([place])    Bipolar disorder ([place])    Bipolar disorder ([place]) 04/27/2020   Clostridium difficile diarrhea 08/02/2019   Dehydration    Essential hypertension    Fall 02/11/2020   Morbid obesity ([place])    Osteomyelitis ([place]) 12/03/2019   Pneumonia due to COVID-19 virus 09/15/2019   Type 2 diabetes mellitus (HCC)    Weakness      Subjective:   This NP Osborne Oman reviewed medical records, received report from team, assessed the patient and then met at the patient's bedside with patient's brother [name] to discuss diagnosis, prognosis, GOC, EOL wishes disposition and options.  Patient somnolent but easily aroused. She did become more awake during my visit. Answered all questions appropriately and engaged in discussions with her brother.    Concept of Palliative Care was introduced as specialized medical care for people and their families living with serious illness.  It focuses on providing relief from the symptoms and stress of a serious illness.  The goal is to improve quality of life for both the patient and  the family. Values and goals of care important to patient and family were attempted to be elicited.  I created space and opportunity for family to explore state of health prior to admission, thoughts, and feelings.   [name] shares patient's traumatizing events over her early adult years. They were born on a camp in [place], later to be relocated to [place] and then [place] before coming to the [place]. 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 During that time a medical team rushed in, restrained her, and gave her some sort of injection. Brother shares they then carried her off and she was in their custody for over a month. The next time he saw her was when family was in [place] and they delivered her to the family at [facility] airport. She was unresponsive at the time of arrival and no longer acted like herself. [name] shares she was connected to a Building control surveyor and patient was stable living a normal and healthy life for many years. She was able to attend college and advance with 2 Master's degree. She then had another psychiatric break after her well known Psychiatrist retired and she had to begin seeing someone else.   Brother shares patient again lived a normal life after additional episodes however during [place] she began to have health concerns after 10 years of normalcy. He shares pictures where she began hoarding and purchased hundreds of items in multiples. He shares several months  ago she fell due to limited ability to move throughout the home. He states she centered her life around online shopping, watching tv, and isolating.   Patient has been a resident of [facility] since Feb 2021.  We discussed Her current illness and what it means in the larger context of Her on-going co-morbidities. Natural disease trajectory and expectations were discussed.  [name] verbalized understanding of current illness and co-morbidities. He shares his observation of  patient's decline over the past year.   We discussed at length patient's continued cycle of admissions centered around her poor respiratory. He acknowledges patient's decline in health even more after each admission. We discussed this will only continue with awareness her time is becoming very limited and she is at risk for sudden death and no chance of a meaningful recovery. I encouraged [name] to consider patient's quality of life with awareness she will not return to her baseline given her disease pattern and complications. He is tearful expressing his awareness but difficulty seeing her in current state.   I discussed the importance of continued conversation with family and their medical providers regarding overall plan of care and treatment options, ensuring decisions are within the context of the patients values and GOCs.  Questions and concerns were addressed.  Hard Choices booklet left for review. The family was encouraged to call with questions or concerns.  PMT will continue to support holistically as needed.  Life Review: Prior to illness patient lived alone in her [facility]. Never married and no children. 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 She is originally from [place]. Enjoys watching tv.    Objective:   Primary Diagnoses: Present on Admission:  Acute on chronic respiratory failure with hypoxia and hypercapnia (HCC)  Respiratory failure (HCC)  Acute and chronic respiratory failure with hypercapnia (HCC)  Pressure injury of skin   Scheduled Meds:  (feeding supplement) PROSource Plus  30 mL Oral BID BM   divalproex  750 mg Oral Q12H   enoxaparin (LOVENOX) injection  95 mg Subcutaneous Q12H   insulin aspart  0-15 Units Subcutaneous Q4H   mouth rinse  15 mL Mouth Rinse BID   midodrine  10 mg Oral TID   multivitamin with minerals  1 tablet Oral Daily   mupirocin ointment  1 application  Nasal BID   pantoprazole (PROTONIX) IV  40 mg Intravenous Q24H   polyethylene glycol  17 g Oral Daily   risperiDONE  2 mg Oral BID    Continuous Infusions:  sodium chloride Stopped (04/26/21 1647)   amiodarone 30 mg/hr (04/27/21 0900)   norepinephrine (LEVOPHED) Adult infusion Stopped (04/26/21 1735)    PRN Meds: diphenhydrAMINE-zinc acetate, ipratropium-albuterol  Allergies  Allergen Reactions   Chlorhexidine Gluconate Itching   Vancomycin     Vancomycin infusion related reaction- May need Vanc to be given at a slower rate    Acetazolamide Er Rash    Review of Systems  Unable to perform ROS: Acuity of condition  Unless otherwise noted, a complete review of systems is negative.  Physical Exam General: NAD, obese, chronically-ill appearing Cardiovascular: regular rate and rhythm Pulmonary: diminished bilaterally, 6L Abdomen: soft, nontender, + bowel sounds Extremities: lower extremity edema, no joint deformities Skin: no rashes, warm and dry Neurological: AAO x3, able to interact appropriately and express needs, follows commands, intermittent agitation  Vital Signs:  BP (!) 106/42   Pulse 66  Temp 98.6 F (37 C) (Oral)   Resp 15   Ht '5\' 6"'  (1.676 m)   Wt 101.5 kg   LMP  (LMP Unknown)   SpO2 98%   BMI 36.12 kg/m  Pain Scale: 0-10   Pain Score: 0-No pain  SpO2: SpO2: 98 % O2 Device:SpO2: 98 % O2 Flow Rate: .O2 Flow Rate (L/min): 6 L/min  IO: Intake/output summary:  Intake/Output Summary (Last 24 hours) at 04/27/2021 1109 Last data filed at 04/27/2021 0900 Gross per 24 hour  Intake 1280.84 ml  Output 450 ml  Net 830.84 ml    LBM: Last BM Date: 04/27/21 Baseline Weight: Weight: 98.4 kg Most recent weight: Weight: 101.5 kg      Palliative Assessment/Data: PPS 30%   Advanced Care Planning:   Primary Decision Maker: NEXT OF KIN  Code Status/Advance Care Planning: Full code  A discussion was had today regarding advanced directives. Concepts  specific to code status, artifical feeding and hydration, continued IV antibiotics and rehospitalization was had.    Patient does not have an advanced directive. She does have a completed MOST form on file from 2021 which brother has in his possession also. We reviewed extensively. I discussed at length patient's full code status with consideration of her current illness and co-morbidities. [name] shares patient has expressed she would want every thing done, however after discussions and further education he would not want her to suffer and would not want to prolong her life by machines. He is tearful expressing he would like to have some time to think on our discussions and now with a better understanding of patient's condition and status prior to making any final decisions. He shares at present he is strongly considering DNR/DNI, no trach or peg.    Assessment & Plan:   SUMMARY OF RECOMMENDATIONS   Full Code-as confirmed by brother. He is requesting some time to think about wishes on behalf of patient. Although she has previously expressed she would want everything done he is leaning towards DNR, no artificial feedings, PEG, no trach but to continue with current measures to allow for improvement if possible. He shares his observation of her continued declined and does not wish for her to suffer. MOST form reviewed with recommendations to update once final decisions are made.  Brother shares previous medications for Bipolar/Mood disorder were controlling symptoms. Per chart review patient was on  Depakote 500 mg BID Seroquel 250 mg QHS  Continue with current plan of care  PMT will continue to support and follow as needed. Please call team line with urgent needs.   Palliative Prophylaxis:  Aspiration, Bowel Regimen, Delirium Protocol, Frequent Pain Assessment, Oral Care, Palliative Wound Care, and Turn Reposition  Additional Recommendations (Limitations, Scope, Preferences): Full Scope  Treatment  Psycho-social/Spiritual:  Desire for further Chaplaincy support: no Additional Recommendations:  ongoing goals of care discussions.   Prognosis:  Guarded-Poor   Discharge Planning:  To Be Determined   Discussed with: RN    Brother, [name] expressed understanding and was in agreement with this plan.   Time In: 1000 Time Out: 1130 Time Total: 90 min.   Visit consisted of counseling and education dealing with the complex and emotionally intense issues of symptom management and palliative care in the setting of serious and potentially life-threatening illness.Greater than 50%  of this time was spent counseling and coordinating care related to the above assessment and plan.  Signed by:  Alda Lea, AGPCNP-BC Palliative Medicine Team  Phone: [phone] Pager: [phone]  Amion: Delane Ginger Cousar   Thank you for allowing the Palliative Medicine Team to assist in the care of this patient. Please utilize secure chat with additional questions, if there is no response within 30 minutes please call the above phone number. Palliative Medicine Team providers are available by phone from 7am to 5pm daily and can be reached through the team cell phone.  Should this patient require assistance outside of these hours, please call the patient's attending physician.

## 2021-04-27 NOTE — Progress Notes (Signed)
NAME:  Becky Hendricks, MRN:  [id number], DOB:  February 18, 1946, LOS: 6 ADMISSION DATE:  04/21/2021, CONSULTATION DATE: 04/26/2019 REFERRING MD: [facility] hospitalist team, CHIEF COMPLAINT: Hypotension hypoxia and hypercarbia complicated by altered mental status  History of Present Illness:  75 year old nursing home patient has had multiple admissions and required intubation for altered mental status hypercarbic respiratory failure with noncompliance of noninvasive mechanical ventilatory support.  She has a known bipolar disorder which interferes with her compliance issues initially admitted on 04/21/2021 for altered mental status was on the floor on 04/24/2021 noted to be in atrial fibrillation trickle response was placed on Cardizem drip which subsequently led to hypotension and due to the fact her mental status continued to decline she is moved to the intensive care unit for peripheral vasopressors with Levophed.  Pulmonary critical care was asked to see and assume her care on 04/25/2021 she is noted to be in respiratory distress poorly responsive and hypotensive and currently on Levophed drip.  She may need intubation and central line placement in the near future.  Pertinent  Medical History    has a past medical history of Atrial fibrillation ([place]), Bipolar disorder ([place]), Bipolar disorder ([place]) (04/27/2020), Clostridium difficile diarrhea (08/02/2019), Dehydration, Essential hypertension, Fall (02/11/2020), Morbid obesity ([place]), Osteomyelitis ([place]) (12/03/2019), Pneumonia due to COVID-19 virus (09/15/2019), Type 2 diabetes mellitus ([place]), and Weakness.   Significant Hospital Events: Including procedures, antibiotic start and stop dates in addition to other pertinent events   04/25/2021 transferred to intensive care unit for hypotension and decreased level of consciousness.  Mental status improved and did not require intubation 04/26/2021 started on midodrine Levophed discontinued 15 2022 off vasopressors  hemodynamically stable on amiodarone drip transfer to stepdown unit   Interim History / Subjective:   Started on midodrine 04/26/2021 now off vasopressors.  Meets criteria for transfer to stepdown unit  Objective   Blood pressure (!) 116/47, pulse 70, temperature 100.2 F (37.9 C), temperature source Axillary, resp. rate 11, height '5\' 6"'  (1.676 m), weight 101.5 kg, SpO2 96 %.    FiO2 (%):  [40 %] 40 %   Intake/Output Summary (Last 24 hours) at 04/27/2021 0737 Last data filed at 04/27/2021 0500 Gross per 24 hour  Intake 1041.93 ml  Output 450 ml  Net 591.93 ml   Filed Weights   04/25/21 0550 04/27/21 0353  Weight: 98.4 kg 101.5 kg    Examination: General: Elderly female who is having hallucinations and confusion HEENT: No JVD or lymphadenopathy is appreciated Neuro: Can interact appropriately at times.  Needs are not met instantaneously becomes aggressive. CV: Heart sounds are regular with a rate of 64 PULM: Decreased breath sounds in the bases refused BiPAP during the night  GI: soft, bsx4 active  GU: Extremities: warm/dry, 2+ edema  Skin: no rashes or lesions  Recent Labs  Lab 04/25/21 0500 04/25/21 0626 04/26/21 0159 04/26/21 0853 04/27/21 0140 04/27/21 0434  NA 140   < > 138 137 136 135  K 3.6   < > 3.2* 3.7 4.8 4.5  CL 90*  --  88*  --  92*  --   CO2 42*  --  40*  --  39*  --   BUN 20  --  21  --  21  --   CREATININE 0.64  --  0.60  --  0.85  --   GLUCOSE 119*  --  199*  --  153*  --    < > = values in this interval not  displayed.   Recent Labs  Lab 04/24/21 1051 04/25/21 0500 04/25/21 0626 04/26/21 0159 04/26/21 0853 04/27/21 0434  HGB 11.1* 10.1*   < > 10.9* 10.9* 10.5*  HCT 36.9 34.3*   < > 36.8 32.0* 31.0*  WBC 8.7 8.0  --  7.4  --   --   PLT 132* 114*  --  117*  --   --    < > = values in this interval not displayed.     Resolved Hospital Problem list     Assessment & Plan:  Acute on chronic hypercarbic respiratory failure secondary to  noncompliance in the setting of bipolar disease Has intermittently needed BiPAP but however refuses it when awake and alert No evidence of infection.  Follow intermittent chest x-ray   Hypotension after Cardizem drip was started. Resolved 04/27/2021 Started on midodrine 10 mg 3 times daily Able to transfer to stepdown unit at this time  Paroxysmal atrial fibrillation Heart rate well controlled on amiodarone Consider transitioning to p.o.     Bipolar disease Psychiatry following Careful with sedating medications chronic CO2 retention   Due to extensive psychiatric disease long history of noncompliance there is no reason to believe she will not be back in the intensive care unit and sometimes that she becomes noncompliant BiPAP and medications. History of COVID-19 12/03/2019 Monitor  Best Practice (right click and "Reselect all SmartList Selections" daily)   Diet/type: Regular consistency (see orders) and NPO DVT prophylaxis: DOAC GI prophylaxis: N/A Lines: N/ATBD Foley:  N/A Code Status:  full code Last date of multidisciplinary goals of care discussion 04/25/2021 spoke to her brother Charnae Lill and confirmed with him that she is a full code and she needs to be intubated and central line placed this is his wishes that we continue with all interventions needed. However she has refused medical interventions including Bipap. Agree with palliative consult. Family repotted to be coming in 9-15  Critical care time:    [place] Acute Care Nurse Practitioner [place] Please consult Amion 04/27/2021, 7:37 AM

## 2021-04-27 NOTE — Progress Notes (Addendum)
RT note. Patient placed on bipap at this time 16/8, R12 40% sat 98% and resting comfortable, RN aware. RT will continue to monitor.

## 2021-04-28 ENCOUNTER — Inpatient Hospital Stay (HOSPITAL_COMMUNITY): Payer: Medicare Other

## 2021-04-28 DIAGNOSIS — J9621 Acute and chronic respiratory failure with hypoxia: Secondary | ICD-10-CM | POA: Diagnosis not present

## 2021-04-28 DIAGNOSIS — L89304 Pressure ulcer of unspecified buttock, stage 4: Secondary | ICD-10-CM | POA: Diagnosis not present

## 2021-04-28 DIAGNOSIS — J9622 Acute and chronic respiratory failure with hypercapnia: Secondary | ICD-10-CM | POA: Diagnosis not present

## 2021-04-28 LAB — CBC
HCT: 30.3 % — ABNORMAL LOW (ref 36.0–46.0)
Hemoglobin: 9.1 g/dL — ABNORMAL LOW (ref 12.0–15.0)
MCH: 28.4 pg (ref 26.0–34.0)
MCHC: 30 g/dL (ref 30.0–36.0)
MCV: 94.7 fL (ref 80.0–100.0)
Platelets: 117 10*3/uL — ABNORMAL LOW (ref 150–400)
RBC: 3.2 MIL/uL — ABNORMAL LOW (ref 3.87–5.11)
RDW: 15 % (ref 11.5–15.5)
WBC: 8.4 10*3/uL (ref 4.0–10.5)
nRBC: 0 % (ref 0.0–0.2)

## 2021-04-28 LAB — MAGNESIUM: Magnesium: 2.2 mg/dL (ref 1.7–2.4)

## 2021-04-28 LAB — BASIC METABOLIC PANEL
Anion gap: 8 (ref 5–15)
BUN: 15 mg/dL (ref 8–23)
CO2: 40 mmol/L — ABNORMAL HIGH (ref 22–32)
Calcium: 8.3 mg/dL — ABNORMAL LOW (ref 8.9–10.3)
Chloride: 87 mmol/L — ABNORMAL LOW (ref 98–111)
Creatinine, Ser: 0.61 mg/dL (ref 0.44–1.00)
GFR, Estimated: >60 mL/min (ref >60)
Glucose, Bld: 187 mg/dL — ABNORMAL HIGH (ref 70–99)
Potassium: 3.7 mmol/L (ref 3.5–5.1)
Sodium: 135 mmol/L (ref 135–145)

## 2021-04-28 LAB — GLUCOSE, CAPILLARY
Glucose-Capillary: 145 mg/dL — ABNORMAL HIGH (ref 70–99)
Glucose-Capillary: 175 mg/dL — ABNORMAL HIGH (ref 70–99)
Glucose-Capillary: 219 mg/dL — ABNORMAL HIGH (ref 70–99)
Glucose-Capillary: 240 mg/dL — ABNORMAL HIGH (ref 70–99)

## 2021-04-28 LAB — HEPARIN ANTI-XA: Heparin LMW: 0.59 IU/mL

## 2021-04-28 LAB — PHOSPHORUS: Phosphorus: 2.9 mg/dL (ref 2.5–4.6)

## 2021-04-28 IMAGING — DX DG CHEST 1V PORT
1 series · 1 of 1 positions shown · non-contrast
Comparison: Chest radiograph [DATE]

CLINICAL DATA: Abnormal respiration

EXAM:
PORTABLE CHEST 1 VIEW

[chest]
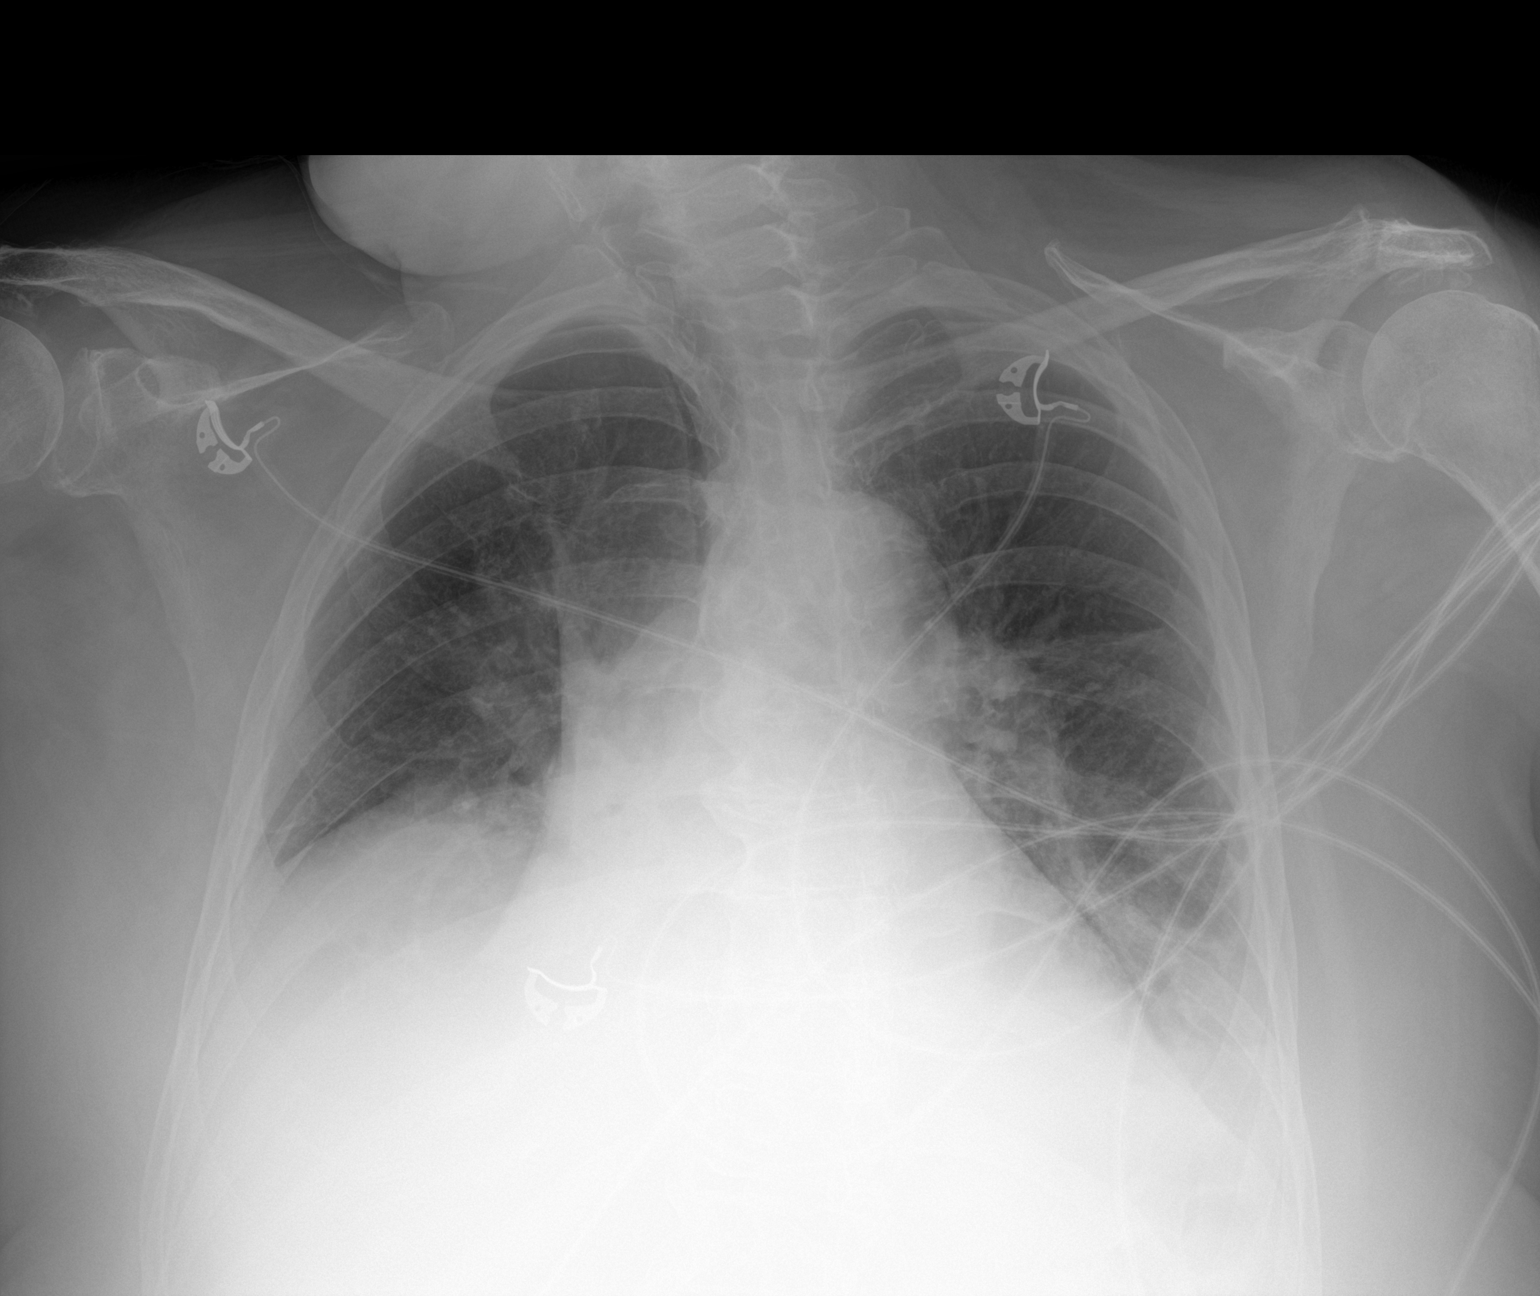

[1 of 1 positions shown; findings below may reference images not displayed]

FINDINGS: The patient is rotated to the right. The cardiomediastinal
silhouette is grossly similar, allowing for this rotation.

Left basilar consolidation is similar to the prior study. Hazy
opacity in the right base has slightly improved. There are small
bilateral pleural effusions, not significantly changed. There is no
pneumothorax.

There is no acute osseous abnormality.
IMPRESSION: Small bilateral pleural effusions with adjacent bibasilar airspace
disease, slightly improved on the right. Left basilar consolidation
is not significantly changed.

## 2021-04-28 MED ORDER — AMIODARONE HCL 200 MG PO TABS: 200.0000 mg | ORAL_TABLET | Freq: Every day | ORAL | Status: DC

## 2021-04-28 MED ORDER — APIXABAN 5 MG PO TABS
5.0000 mg | ORAL_TABLET | Freq: Two times a day (BID) | ORAL | Status: DC
  Administered 2021-04-28 – 2021-05-05 (×14): 5 mg via ORAL
  Filled 2021-04-28 (×12): qty 1
  Filled 2021-04-28: qty 2
  Filled 2021-04-28: qty 1

## 2021-04-28 MED ORDER — ATORVASTATIN CALCIUM 10 MG PO TABS
10.0000 mg | ORAL_TABLET | Freq: Every day | ORAL | Status: DC
  Administered 2021-04-28 – 2021-05-04 (×7): 10 mg via ORAL
  Filled 2021-04-28 (×7): qty 1

## 2021-04-28 MED ORDER — METOPROLOL TARTRATE 25 MG PO TABS
12.5000 mg | ORAL_TABLET | Freq: Two times a day (BID) | ORAL | Status: DC
  Administered 2021-04-28: 12.5 mg via ORAL
  Filled 2021-04-28: qty 1

## 2021-04-28 NOTE — Progress Notes (Signed)
ANTICOAGULATION CONSULT NOTE  Pharmacy Consult for Eliquis Indication: atrial fibrillation  Allergies  Allergen Reactions   Chlorhexidine Gluconate Itching   Vancomycin     Vancomycin infusion related reaction- May need Vanc to be given at a slower rate    Acetazolamide Er Rash    Patient Measurements: Height: 5\' 6"  (167.6 cm) Weight: 99.3 kg (218 lb 14.7 oz) IBW/kg (Calculated) : 59.3  Vital Signs: Temp: 98.4 F (36.9 C) (09/16 1116) Temp Source: Oral (09/16 1116) BP: 136/62 (09/16 1300) Pulse Rate: 69 (09/16 1300)  Labs: Recent Labs    04/26/21 0159 04/26/21 0853 04/27/21 0140 04/27/21 0434 04/27/21 1521 04/28/21 0241 04/28/21 1328  HGB 10.9*   < >  --  10.5* 10.5* 9.1*  --   HCT 36.8   < >  --  31.0* 31.0* 30.3*  --   PLT 117*  --   --   --   --  117*  --   HEPRLOWMOCWT  --   --   --   --   --   --  0.59  CREATININE 0.60  --  0.85  --   --  0.61  --    < > = values in this interval not displayed.    Estimated Creatinine Clearance: 74.5 mL/min (by C-G formula based on SCr of 0.61 mg/dL).   Medications:  Scheduled:   (feeding supplement) PROSource Plus  30 mL Oral BID BM   atorvastatin  10 mg Oral QHS   divalproex  750 mg Oral Q12H   insulin aspart  0-15 Units Subcutaneous TID AC & HS   mouth rinse  15 mL Mouth Rinse BID   metoprolol tartrate  12.5 mg Oral BID   midodrine  10 mg Oral TID   multivitamin with minerals  1 tablet Oral Daily   mupirocin ointment  1 application Nasal BID   pantoprazole  40 mg Oral Daily   polyethylene glycol  17 g Oral Daily   risperiDONE  2 mg Oral BID    Assessment: 75 yo female admitted to the ICU on 9/13 with hypercapnic respiratory respiratory failure, after being unresponsive and hypotensive overnight.   Patient has a history of Afib (on Eliquis PTA).  Given patient was on BiPAP and not reliably taking oral medications, transitioned to anticoagulation with Lovenox on 9/13.   Patient now appears to be tolerating oral  medications, and per MD will resume anticoagulation with Eliquis.  Pharmacy consulted for Eliquis dosing.   Last dose of Lovenox given on 9/16 @ 0858.   Goal of Therapy:  Monitor platelets by anticoagulation protocol: Yes   Plan:  Resume Eliquis 5 mg PO BID (on PTA) Monitor CBC and for signs/symptoms of bleeding    10/16, PharmD PGY1 Pharmacy Resident Phone [phone] 04/28/2021 3:08 PM   Please check AMION for all [facility] Pharmacy phone numbers After 10:00 PM, call Main Pharmacy [phone]

## 2021-04-28 NOTE — Consult Note (Signed)
Reason for Consult: Severe Hallucinations, Paranoia, Bipolar Disorder and labile mood Referring Physician: Merlene Laughter, DO  Becky Hendricks is an 75 y.o. female.  HPI:  Becky Hendricks is a 75 yr old female with a history of chronic hypoxic hypercapnic respiratory failure secondary to OSA, noncompliant with BiPAP at night, morbid obesity, IDDM, HTN, PAF on Eliquis, bipolar disorder, sent from nursing home for AMS. Recently discharged from the hospital on 8/26 and 9/7 after admissions stemming from acute hypoxic and hypercarbic respiratory failure due to her refusal to wear a BiPAP at night.   When I arrived in the room today patient was sitting up in bed with a nasal cannula on being fed her breakfast by tech.  She reports that she is doing well today.  She reports that her sleep is fine.  She reports that she is eating fine.  She reports no SI, HI, or AVH.  She reports that her Risperdal and Depakote continue to do well for her.  She has no questions at this time.   Past Medical History:  Diagnosis Date   Atrial fibrillation (HCC)    Bipolar disorder (HCC)    Bipolar disorder (HCC) 04/27/2020   Clostridium difficile diarrhea 08/02/2019   Dehydration    Essential hypertension    Fall 02/11/2020   Morbid obesity (HCC)    Osteomyelitis (HCC) 12/03/2019   Pneumonia due to COVID-19 virus 09/15/2019   Type 2 diabetes mellitus (HCC)    Weakness     Past Surgical History:  Procedure Laterality Date   INCISION AND DRAINAGE PERIRECTAL ABSCESS Left 12/04/2019   Procedure: IRRIGATION AND DEBRIDEMENT LEFT BUTTOCK ABSCESS;  Surgeon: Violeta Gelinas, MD;  Location: [facility] OR;  Service: General;  Laterality: Left;   INCISION AND DRAINAGE PERIRECTAL ABSCESS Left 12/10/2019   Procedure: REPEAT IRRIGATION AND DEBRIDEMENT BUTTOCK  ABSCESS;  Surgeon: Abigail Miyamoto, MD;  Location: [facility] OR;  Service: General;  Laterality: Left;   IR FLUORO GUIDE CV LINE RIGHT  11/25/2020   IR RADIOLOGIST EVAL & MGMT  12/29/2020    IR US GUIDE VASC ACCESS RIGHT  11/25/2020    Family History  Family history unknown: Yes    Social History:  reports that she has never smoked. She has never used smokeless tobacco. She reports that she does not currently use alcohol. She reports that she does not currently use drugs.  Allergies:  Allergies  Allergen Reactions   Chlorhexidine Gluconate Itching   Vancomycin     Vancomycin infusion related reaction- May need Vanc to be given at a slower rate    Acetazolamide Er Rash    Medications: I have reviewed the patient's current medications.  Results for orders placed or performed during the hospital encounter of 04/21/21 (from the past 48 hour(s))  Glucose, capillary     Status: Abnormal   Collection Time: 04/26/21 11:19 AM  Result Value Ref Range   Glucose-Capillary 164 (H) 70 - 99 mg/dL    Comment: Glucose reference range applies only to samples taken after fasting for at least 8 hours.  Glucose, capillary     Status: Abnormal   Collection Time: 04/26/21  3:38 PM  Result Value Ref Range   Glucose-Capillary 181 (H) 70 - 99 mg/dL    Comment: Glucose reference range applies only to samples taken after fasting for at least 8 hours.  Glucose, capillary     Status: Abnormal   Collection Time: 04/26/21  7:35 PM  Result Value Ref Range   Glucose-Capillary 189 (  H) 70 - 99 mg/dL    Comment: Glucose reference range applies only to samples taken after fasting for at least 8 hours.  Glucose, capillary     Status: Abnormal   Collection Time: 04/26/21 11:15 PM  Result Value Ref Range   Glucose-Capillary 152 (H) 70 - 99 mg/dL    Comment: Glucose reference range applies only to samples taken after fasting for at least 8 hours.  Basic metabolic panel     Status: Abnormal   Collection Time: 04/27/21  1:40 AM  Result Value Ref Range   Sodium 136 135 - 145 mmol/L   Potassium 4.8 3.5 - 5.1 mmol/L    Comment: NO VISIBLE HEMOLYSIS   Chloride 92 (L) 98 - 111 mmol/L   CO2 39 (H) 22 -  32 mmol/L   Glucose, Bld 153 (H) 70 - 99 mg/dL    Comment: Glucose reference range applies only to samples taken after fasting for at least 8 hours.   BUN 21 8 - 23 mg/dL   Creatinine, Ser 6.44 0.44 - 1.00 mg/dL   Calcium 8.0 (L) 8.9 - 10.3 mg/dL   GFR, Estimated >03 >47 mL/min    Comment: (NOTE) Calculated using the CKD-EPI Creatinine Equation (2021)    Anion gap 5 5 - 15    Comment: Performed at [facility], [address], [place], [place] [address]  Phosphorus     Status: None   Collection Time: 04/27/21  1:40 AM  Result Value Ref Range   Phosphorus 3.3 2.5 - 4.6 mg/dL    Comment: Performed at [facility], [address], [place], [place] [address]  Magnesium     Status: None   Collection Time: 04/27/21  1:40 AM  Result Value Ref Range   Magnesium 2.0 1.7 - 2.4 mg/dL    Comment: Performed at [facility], [address], [place], [place] [address]  Glucose, capillary     Status: Abnormal   Collection Time: 04/27/21  3:51 AM  Result Value Ref Range   Glucose-Capillary 129 (H) 70 - 99 mg/dL    Comment: Glucose reference range applies only to samples taken after fasting for at least 8 hours.  I-STAT 7, (LYTES, BLD GAS, ICA, H+H)     Status: Abnormal   Collection Time: 04/27/21  4:34 AM  Result Value Ref Range   pH, Arterial 7.399 7.350 - 7.450   pCO2 arterial 73.3 (HH) 32.0 - 48.0 mmHg   pO2, Arterial 102 83.0 - 108.0 mmHg   Bicarbonate 44.8 (H) 20.0 - 28.0 mmol/L   TCO2 47 (H) 22 - 32 mmol/L   O2 Saturation 97.0 %   Acid-Base Excess 17.0 (H) 0.0 - 2.0 mmol/L   Sodium 135 135 - 145 mmol/L   Potassium 4.5 3.5 - 5.1 mmol/L   Calcium, Ion 1.11 (L) 1.15 - 1.40 mmol/L   HCT 31.0 (L) 36.0 - 46.0 %   Hemoglobin 10.5 (L) 12.0 - 15.0 g/dL   Patient temperature 951.8 F    Sample type ARTERIAL    Comment NOTIFIED PHYSICIAN   Glucose, capillary     Status: Abnormal   Collection Time: 04/27/21  7:49 AM  Result Value Ref Range   Glucose-Capillary 156 (H) 70 - 99  mg/dL    Comment: Glucose reference range applies only to samples taken after fasting for at least 8 hours.  Valproic acid level     Status: None   Collection Time: 04/27/21  8:06 AM  Result Value Ref Range   Valproic Acid Lvl 69 50.0 - 100.0 ug/mL    Comment: Performed at [facility], [address] [place], [place] [address]  Glucose, capillary     Status: Abnormal   Collection Time: 04/27/21 11:55 AM  Result Value Ref Range   Glucose-Capillary 167 (H) 70 - 99 mg/dL    Comment: Glucose reference range applies only to samples taken after fasting for at least 8 hours.  I-STAT 7, (LYTES, BLD GAS, ICA, H+H)     Status: Abnormal   Collection Time: 04/27/21  3:21 PM  Result Value Ref Range   pH, Arterial 7.374 7.350 - 7.450   pCO2 arterial 76.2 (HH) 32.0 - 48.0 mmHg   pO2, Arterial 128 (H) 83.0 - 108.0 mmHg   Bicarbonate 44.3 (H) 20.0 - 28.0 mmol/L   TCO2 47 (H) 22 - 32 mmol/L   O2 Saturation 99.0 %   Acid-Base Excess 16.0 (H) 0.0 - 2.0 mmol/L   Sodium 134 (L) 135 - 145 mmol/L   Potassium 4.1 3.5 - 5.1 mmol/L   Calcium, Ion 1.13 (L) 1.15 - 1.40 mmol/L   HCT 31.0 (L) 36.0 - 46.0 %   Hemoglobin 10.5 (L) 12.0 - 15.0 g/dL   Patient temperature 43.3 F    Collection site Radial    Drawn by RT    Sample type ARTERIAL    Comment NOTIFIED PHYSICIAN   Glucose, capillary     Status: Abnormal   Collection Time: 04/27/21  3:58 PM  Result Value Ref Range   Glucose-Capillary 130 (H) 70 - 99 mg/dL    Comment: Glucose reference range applies only to samples taken after fasting for at least 8 hours.  Glucose, capillary     Status: Abnormal   Collection Time: 04/27/21  8:15 PM  Result Value Ref Range   Glucose-Capillary 176 (H) 70 - 99 mg/dL    Comment: Glucose reference range applies only to samples taken after fasting for at least 8 hours.  Magnesium     Status: None   Collection Time: 04/28/21  2:41 AM  Result Value Ref Range   Magnesium 2.2 1.7 - 2.4 mg/dL    Comment: Performed at  [facility], [address] [place], [place] [address]  Phosphorus     Status: None   Collection Time: 04/28/21  2:41 AM  Result Value Ref Range   Phosphorus 2.9 2.5 - 4.6 mg/dL    Comment: Performed at [facility], [address], [place], [place] [address]  CBC     Status: Abnormal   Collection Time: 04/28/21  2:41 AM  Result Value Ref Range   WBC 8.4 4.0 - 10.5 K/uL   RBC 3.20 (L) 3.87 - 5.11 MIL/uL   Hemoglobin 9.1 (L) 12.0 - 15.0 g/dL   HCT 06.3 (L) 01.6 - 01.0 %   MCV 94.7 80.0 - 100.0 fL   MCH 28.4 26.0 - 34.0 pg   MCHC 30.0 30.0 - 36.0 g/dL   RDW 93.2 35.5 - 73.2 %   Platelets 117 (L) 150 - 400 K/uL    Comment: Immature Platelet Fraction may be clinically indicated, consider ordering this additional test [id number] CONSISTENT WITH PREVIOUS RESULT REPEATED TO VERIFY    nRBC 0.0 0.0 - 0.2 %    Comment: Performed at [facility], [address] [place], [place] [address]  Basic metabolic panel     Status: Abnormal   Collection Time: 04/28/21  2:41 AM  Result Value Ref Range   Sodium 135 135 - 145 mmol/L   Potassium 3.7 3.5 - 5.1 mmol/L   Chloride 87 (L) 98 - 111 mmol/L   CO2 40 (H) 22 - 32 mmol/L   Glucose, Bld 187 (H) 70 - 99 mg/dL    Comment: Glucose reference range applies only to samples taken after fasting for at least 8 hours.   BUN 15 8 - 23 mg/dL   Creatinine, Ser 9.67 0.44 - 1.00 mg/dL   Calcium 8.3 (L) 8.9 - 10.3 mg/dL   GFR, Estimated >89 >38 mL/min    Comment: (NOTE) Calculated using the CKD-EPI Creatinine Equation (2021)    Anion gap 8 5 - 15    Comment: Performed at [facility], [address], [place], [place] [address]  Glucose, capillary     Status: Abnormal   Collection Time: 04/28/21  7:37 AM  Result Value Ref Range   Glucose-Capillary 145 (H) 70 - 99 mg/dL    Comment: Glucose reference range applies only to samples taken after fasting for at least 8 hours.    DG Chest Port 1 View  Result Date:  04/28/2021 CLINICAL DATA:  Abnormal respiration EXAM: PORTABLE CHEST 1 VIEW COMPARISON:  Chest radiograph 04/26/2021 FINDINGS: The patient is rotated to the right. The cardiomediastinal silhouette is grossly similar, allowing for this rotation. Left basilar consolidation is similar to the prior study. Hazy opacity in the right base has slightly improved. There are small bilateral pleural effusions, not significantly changed. There is no pneumothorax. There is no acute osseous abnormality. IMPRESSION: Small bilateral pleural effusions with adjacent bibasilar airspace disease, slightly improved on the right. Left basilar consolidation is not significantly changed. Electronically Signed   By: Lesia Hausen M.D.   On: 04/28/2021 08:42    Review of Systems  Respiratory:  Negative for shortness of breath.   Cardiovascular:  Negative for chest pain.  Gastrointestinal:  Negative for abdominal pain, constipation, diarrhea, nausea and vomiting.  Neurological:  Negative for weakness and headaches.  Psychiatric/Behavioral:  Negative for hallucinations and suicidal ideas. The patient is not nervous/anxious.   Blood pressure (!) 99/44, pulse 65, temperature 98.4 F (36.9 C), temperature source Axillary, resp. rate 16, height 5\' 6"  (1.676 m), weight 99.3 kg, SpO2 99 %. Physical Exam Vitals and nursing note reviewed.  Constitutional:      General: She is not in acute distress.    Appearance: Normal appearance. She is obese. She is ill-appearing. She is not toxic-appearing.  HENT:     Head: Normocephalic and atraumatic.  Pulmonary:     Comments: Has nasal canula Musculoskeletal:        General: Normal range of motion.  Neurological:     General: No focal deficit present.     Mental Status: She is alert.    04/28/21 0913  Presentation  General Appearance Appropriate for Environment;Disheveled  Eye Contact Fair  Speech Clear and Coherent;Slow  Speech Volume Normal  Mood and Affect  Mood  Dysphoric;Irritable  Affect Depressed  Thought Processes  Thought Process Coherent  Descriptions of Associations Intact  Orientation Partial  Thought Content Logical;WDL  Hallucinations None  Ideas of Reference None  Suicidal Thoughts No  Homicidal Thoughts No  Sensorium  Memory Immediate Fair;Recent Fair  Judgment Impaired  Insight Lacking  Executive Functions  Concentration Fair  Attention Span Fair  Recall 04/30/21 of Knowledge Fair  Language Fair  Psychomotor Activity  Psychomotor Activity Normal  Assets  Assets Communication Skills;Resilience  Sleep  Sleep Fair    Assessment/Plan: Case and Plan discussed with Dr. Lucianne Muss  Becky Hendricks is a 75 yr old female with a history of chronic hypoxic hypercapnic respiratory failure secondary to OSA, noncompliant with BiPAP at night, morbid obesity, IDDM, HTN, PAF on Eliquis, bipolar disorder, sent from nursing home for AMS. Recently discharged from the hospital on 8/26 and 9/7 after admissions stemming from acute hypoxic and hypercarbic respiratory failure due to her refusal to wear a BiPAP at night.      She continues to be from a psychiatric standpoint stable.  She still has her baseline irritability.  She does continue to wear the BiPAP some which is an improvement.  Palliative care has had extensive discussions with her brother as patient's overall prognosis still is not good.  We will not make any medication changes at this time.  At this time we will sign off.     Bipolar Disorder: -Continue Depakote 750 mg BID -Continue Risperdal M-tabs 2 mg BID      Continue rest of care per Primary Team Psychiatry will sign off on the patient at this time, please reconsult if any questions arise.     Disposition: No evidence of imminent risk to self or others at present. Does not meet criteria for inpatient treatment  Lauro Franklin 04/28/2021, 9:07 AM

## 2021-04-28 NOTE — Progress Notes (Signed)
Nutrition Follow-up  DOCUMENTATION CODES:   Obesity unspecified  INTERVENTION:   -Continue MVI with minerals daily -Continue 30 ml Prosource Plus BID, each supplement provides 100 kcals and 15 grams protein -Continue Magic cup BID with meals, each supplement provides 290 kcal and 9 grams of protein   NUTRITION DIAGNOSIS:   Increased nutrient needs related to wound healing as evidenced by estimated needs.  Ongoing  GOAL:   Patient will meet greater than or equal to 90% of their needs  Progressing   MONITOR:   PO intake, Supplement acceptance, Skin  REASON FOR ASSESSMENT:   Consult COPD Protocol  ASSESSMENT:   75 yo female admitted from SNF with AMS. PMH includes chronic respiratory failure d/t OSA, noncompliant with BiPAP at night, IDDM, HTN, PAF, bipolar disorder.  Reviewed I/O's: +2 L x 24 hours and +2.6 L since admission  UOP: 800 ml x 24 hours  Pt very lethargic at time of visit. She did not respond to touch or voice.   Pt consuming 75-100% of meals. She is receiving feeding assistance from staff. She is taking Prosource supplements.   Palliative care team following; per notes, pt brother leaning more towards a comfort care apprach, however, would like to think things over prior to fully making that decision.   Medications reviewed and include miralax.   Labs reviewed: CBGS: 145-175 (inpatient orders for glycemic control are 0-15 units insulin aspart TID before meals and at bedtime).    NUTRITION - FOCUSED PHYSICAL EXAM:  Flowsheet Row Most Recent Value  Orbital Region No depletion  Upper Arm Region No depletion  Thoracic and Lumbar Region No depletion  Buccal Region No depletion  Temple Region No depletion  Clavicle Bone Region No depletion  Clavicle and Acromion Bone Region No depletion  Scapular Bone Region No depletion  Dorsal Hand No depletion  Patellar Region No depletion  Anterior Thigh Region No depletion  Posterior Calf Region No depletion   Edema (RD Assessment) Moderate  Hair Reviewed  Eyes Reviewed  Mouth Reviewed  Skin Reviewed  Nails Reviewed       Diet Order:   Diet Order             Diet heart healthy/carb modified Room service appropriate? Yes; Fluid consistency: Thin  Diet effective now                   EDUCATION NEEDS:   Not appropriate for education at this time  Skin:  Skin Assessment: Skin Integrity Issues: Skin Integrity Issues:: Other (Comment), Stage II, DTI DTI: rt lumbar Stage II: sacrum, nose Unstageable: - Other: IAD labia  Last BM:  04/28/21 (type 6)  Height:   Ht Readings from Last 1 Encounters:  04/24/21 5\' 6"  (1.676 m)    Weight:   Wt Readings from Last 1 Encounters:  04/28/21 99.3 kg   BMI:  Body mass index is 35.33 kg/m.  Estimated Nutritional Needs:   Kcal:  1800-2000  Protein:  110-130 gm  Fluid:  >/= 1.8 L    04/30/21, RD, LDN, CDCES Registered Dietitian II Certified Diabetes Care and Education Specialist Please refer to [facility] for RD and/or RD on-call/weekend/after hours pager

## 2021-04-28 NOTE — Progress Notes (Addendum)
PROGRESS NOTE    Becky Hendricks  GNF:[id number] DOB: February 03, 1946 DOA: 04/21/2021 PCP: Roderic Scarce, MD    Brief Narrative:   Becky Hendricks is a 75 year old female with past medical history significant for chronic respiratory failure/OSA noncompliant with CPAP/BiPAP, diabetes mellitus type 2, essential hypertension, paroxysmal atrial fibrillation on Eliquis, bipolar disorder, morbid obesity who presented from SNF on 04/25/21 with altered mental status. Patient was noted to be obtunded with her BiPAP being off.  She does have a history of multiple recent hospitalizations.  In the ED, patient was noted to have hypercapnic respiratory failure with pH of 7.29, PaCO2 100, PaO2 71.  PCCM was consulted and patient was admitted to hospital.  Assessment & Plan:   Active Problems:   Pressure injury of skin   Acute on chronic respiratory failure with hypoxia and hypercapnia (HCC)   Acute and chronic respiratory failure with hypercapnia (HCC)   Respiratory failure (HCC)  Acute on chronic hypoxic/hypercapnic respiratory failure, POA Acute metabolic encephalopathy, POA Patient has BiPAP at the skilled nursing facility but was likely noncompliant.  Multiple hospitalizations and intubation in the past.  Palliative care on board. ABG from 04/27/2020 showed PCO2 of 73.  Patient has very poor prognosis given noncompliance.  Continue with BiPAP when napping or during night.  Patient appears to be lethargic and advised nursing staff to restart BiPAP   Hypotension Was on Levophed which has been titrated off.  Midodrine has been initiated.  PCCM on board.  Acute on chronic diastolic congestive heart failure -Strict intake and output charting, daily weights, closely monitor.  Beta-blocker on hold. On midodrine.  Hypokalemia Latest potassium of 3.7.  Elevated ammonia level Continue lactulose  Paroxysmal atrial fibrillation with RVR Thought to be secondary to norepinephrine.  Normal TSH.   On Eliquis.  Rate  controlled at this time.  Was on amiodarone drip will discontinue.  Start the patient on oral metoprolol with holding parameters for blood pressure and heart rate  Hyperlipidemia  continue Lipitor  Bipolar disorder Psychiatry on board, continue Depakote, risperidone.    Hx COVID-19 infection April 2021.  GERD: Continue Protonix daily.  Pressure injury, sacral stage II ulceration, lumbar right deep tissue injury, nasal stage II pressure injury.  Present on admission.  Continue supportive care, wound care  Pressure Injury 04/21/21 Sacrum Medial Stage 2 -  Partial thickness loss of dermis presenting as a shallow open injury with a red, pink wound bed without slough. yellow, pink, craterous  04/25/21; updated staging, healed, samll opening (Active)  04/21/21 1720  Location: Sacrum  Location Orientation: Medial  Staging: Stage 2 -  Partial thickness loss of dermis presenting as a shallow open injury with a red, pink wound bed without slough.  Wound Description (Comments): yellow, pink, craterous  04/25/21; updated staging, healed, samll opening  Present on Admission: Yes     Pressure Injury 04/24/21 Lumbar Right Deep Tissue Pressure Injury - Purple or maroon localized area of discolored intact skin or blood-filled blister due to damage of underlying soft tissue from pressure and/or shear. (Active)  04/24/21 2000  Location: Lumbar  Location Orientation: Right  Staging: Deep Tissue Pressure Injury - Purple or maroon localized area of discolored intact skin or blood-filled blister due to damage of underlying soft tissue from pressure and/or shear.  Wound Description (Comments):   Present on Admission:      Pressure Injury Nose  Stage 2 -  Partial thickness loss of dermis presenting as a shallow open injury with a red, pink  wound bed without slough. scab (Active)     Location: Nose  Location Orientation: -- (bridge of nose)  Staging: Stage 2 -  Partial thickness loss of dermis presenting as a  shallow open injury with a red, pink wound bed without slough.  Wound Description (Comments): scab  Present on Admission:     Goals of care: Palliative care has been consulted due to recurrent hospitalizations, noncompliance with treatment for goals of care.    DVT prophylaxis:  Lovenox subcu    Code Status: Full Code  Family Communication:  I tried to reach the patient's brother on the phone but was unable to reach him today  Disposition Plan:   Status is: Inpatient  Remains inpatient appropriate because: Altered mental status, Unsafe d/c plan, IV treatments appropriate due to intensity of illness or inability to take PO, and Inpatient level of care appropriate due to severity of illness  Dispo: The patient is from: SNF              Anticipated d/c is to: SNF, will need goals of care discussion,              Patient currently is not medically stable to d/c.   Difficult to place patient No  Consultants:  PCCM Cardiology -signed off 9/12  Procedures:  BiPAP  Antimicrobials:  None   Subjective: Today, patient was seen and examined at bedside.  Appeared to be slightly lethargic this morning while on BiPAP.  Objective: Vitals:   04/28/21 0600 04/28/21 0733 04/28/21 0739 04/28/21 1116  BP: (!) 99/44     Pulse: (!) 55 65    Resp: (!) 26 16    Temp:   98.4 F (36.9 C) 98.4 F (36.9 C)  TempSrc:   Axillary Oral  SpO2: 99% 99%    Weight:      Height:        Intake/Output Summary (Last 24 hours) at 04/28/2021 1244 Last data filed at 04/28/2021 0400 Gross per 24 hour  Intake 1221.9 ml  Output --  Net 1221.9 ml    Filed Weights   04/25/21 0550 04/27/21 0353 04/28/21 0500  Weight: 98.4 kg 101.5 kg 99.3 kg    Physical examination:  General: Lethargic, off BiPAP on nasal cannula oxygen. HENT:   No scleral pallor or icterus noted. Oral mucosa is moist.  Chest:  .  Diminished breath sounds bilaterally. No crackles or wheezes.  CVS: S1 &S2 heard. No murmur.   Irregular irregular rhythm.  Rate controlled. Abdomen: Soft, nontender, nondistended.  Bowel sounds are heard.   Extremities: No cyanosis, clubbing or edema.  Peripheral pulses are palpable. Psych: Lethargic CNS: Lethargic, Skin: Warm and dry.  No rashes noted.  Data Reviewed: I have personally reviewed the following labs and imaging studies   CBC: Recent Labs  Lab 04/22/21 0912 04/23/21 0336 04/24/21 1051 04/25/21 0500 04/25/21 0626 04/26/21 0159 04/26/21 0853 04/27/21 0434 04/27/21 1521 04/28/21 0241  WBC 6.6 7.1 8.7 8.0  --  7.4  --   --   --  8.4  NEUTROABS 3.6 3.5 6.1 4.7  --  4.0  --   --   --   --   HGB 9.7* 10.5* 11.1* 10.1*   < > 10.9* 10.9* 10.5* 10.5* 9.1*  HCT 31.9* 33.6* 36.9 34.3*   < > 36.8 32.0* 31.0* 31.0* 30.3*  MCV 92.5 91.6 94.1 95.8  --  94.4  --   --   --  94.7  PLT 137*  148* 132* 114*  --  117*  --   --   --  117*   < > = values in this interval not displayed.    Basic Metabolic Panel: Recent Labs  Lab 04/24/21 1051 04/25/21 0500 04/25/21 0626 04/26/21 0159 04/26/21 0853 04/27/21 0140 04/27/21 0434 04/27/21 1521 04/28/21 0241  NA 142 140   < > 138 137 136 135 134* 135  K 3.6 3.6   < > 3.2* 3.7 4.8 4.5 4.1 3.7  CL 94* 90*  --  88*  --  92*  --   --  87*  CO2 38* 42*  --  40*  --  39*  --   --  40*  GLUCOSE 165* 119*  --  199*  --  153*  --   --  187*  BUN 19 20  --  21  --  21  --   --  15  CREATININE 0.74 0.64  --  0.60  --  0.85  --   --  0.61  CALCIUM 8.6* 8.6*  --  8.5*  --  8.0*  --   --  8.3*  MG 2.1 2.2  --  2.1  --  2.0  --   --  2.2  PHOS 4.5 3.8  --  2.3*  --  3.3  --   --  2.9   < > = values in this interval not displayed.    GFR: Estimated Creatinine Clearance: 74.5 mL/min (by C-G formula based on SCr of 0.61 mg/dL). Liver Function Tests: Recent Labs  Lab 04/22/21 0912 04/23/21 0336 04/24/21 1051 04/25/21 0500 04/26/21 0159  AST 8* 8* 9* 8* 10*  ALT 7 7 9 8 11   ALKPHOS 33* 33* 30* 28* 31*  BILITOT 0.8 0.3 1.1 0.9  0.7  PROT 5.0* 5.3* 5.4* 5.0* 5.3*  ALBUMIN 2.2* 2.3* 2.3* 2.2* 2.2*    No results for input(s): LIPASE, AMYLASE in the last 168 hours. Recent Labs  Lab 04/22/21 1658 04/23/21 0851  AMMONIA 101* 23    Coagulation Profile: No results for input(s): INR, PROTIME in the last 168 hours. Cardiac Enzymes: No results for input(s): CKTOTAL, CKMB, CKMBINDEX, TROPONINI in the last 168 hours. BNP (last 3 results) No results for input(s): PROBNP in the last 8760 hours. HbA1C: No results for input(s): HGBA1C in the last 72 hours. CBG: Recent Labs  Lab 04/27/21 1155 04/27/21 1558 04/27/21 2015 04/28/21 0737 04/28/21 1115  GLUCAP 167* 130* 176* 145* 175*    Lipid Profile: No results for input(s): CHOL, HDL, LDLCALC, TRIG, CHOLHDL, LDLDIRECT in the last 72 hours. Thyroid Function Tests: No results for input(s): TSH, T4TOTAL, FREET4, T3FREE, THYROIDAB in the last 72 hours. Anemia Panel: No results for input(s): VITAMINB12, FOLATE, FERRITIN, TIBC, IRON, RETICCTPCT in the last 72 hours. Sepsis Labs: Recent Labs  Lab 04/25/21 0505  PROCALCITON <0.10     Recent Results (from the past 240 hour(s))  Resp Panel by RT-PCR (Flu A&B, Covid) Nasopharyngeal Swab     Status: None   Collection Time: 04/21/21 10:18 AM   Specimen: Nasopharyngeal Swab; Nasopharyngeal(NP) swabs in vial transport medium  Result Value Ref Range Status   SARS Coronavirus 2 by RT PCR NEGATIVE NEGATIVE Final    Comment: (NOTE) SARS-CoV-2 target nucleic acids are NOT DETECTED.  The SARS-CoV-2 RNA is generally detectable in upper respiratory specimens during the acute phase of infection. The lowest concentration of SARS-CoV-2 viral copies this assay can detect is 138 copies/mL. A  negative result does not preclude SARS-Cov-2 infection and should not be used as the sole basis for treatment or other patient management decisions. A negative result may occur with  improper specimen collection/handling, submission of  specimen other than nasopharyngeal swab, presence of viral mutation(s) within the areas targeted by this assay, and inadequate number of viral copies(<138 copies/mL). A negative result must be combined with clinical observations, patient history, and epidemiological information. The expected result is Negative.  Fact Sheet for Patients:  BloggerCourse.com  Fact Sheet for Healthcare Providers:  SeriousBroker.it  This test is no t yet approved or cleared by the [place] FDA and  has been authorized for detection and/or diagnosis of SARS-CoV-2 by FDA under an Emergency Use Authorization (EUA). This EUA will remain  in effect (meaning this test can be used) for the duration of the COVID-19 declaration under Section 564(b)(1) of the Act, 21 U.S.C.section 360bbb-3(b)(1), unless the authorization is terminated  or revoked sooner.       Influenza A by PCR NEGATIVE NEGATIVE Final   Influenza B by PCR NEGATIVE NEGATIVE Final    Comment: (NOTE) The Xpert Xpress SARS-CoV-2/FLU/RSV plus assay is intended as an aid in the diagnosis of influenza from Nasopharyngeal swab specimens and should not be used as a sole basis for treatment. Nasal washings and aspirates are unacceptable for Xpert Xpress SARS-CoV-2/FLU/RSV testing.  Fact Sheet for Patients: BloggerCourse.com  Fact Sheet for Healthcare Providers: SeriousBroker.it  This test is not yet approved or cleared by the [place] FDA and has been authorized for detection and/or diagnosis of SARS-CoV-2 by FDA under an Emergency Use Authorization (EUA). This EUA will remain in effect (meaning this test can be used) for the duration of the COVID-19 declaration under Section 564(b)(1) of the Act, 21 U.S.C. section 360bbb-3(b)(1), unless the authorization is terminated or revoked.  Performed at [facility], [address],  [place], [place] [address]   Culture, blood (routine x 2)     Status: None   Collection Time: 04/22/21  4:58 PM   Specimen: BLOOD RIGHT HAND  Result Value Ref Range Status   Specimen Description BLOOD RIGHT HAND  Final   Special Requests   Final    BOTTLES DRAWN AEROBIC ONLY Blood Culture adequate volume   Culture   Final    NO GROWTH 5 DAYS Performed at [facility], [address], [place], [place] [address]    Report Status 04/27/2021 FINAL  Final  Culture, blood (routine x 2)     Status: None   Collection Time: 04/22/21  5:07 PM   Specimen: BLOOD  Result Value Ref Range Status   Specimen Description BLOOD LEFT ANTECUBITAL  Final   Special Requests   Final    BOTTLES DRAWN AEROBIC ONLY Blood Culture adequate volume   Culture   Final    NO GROWTH 5 DAYS Performed at [facility], [address], [place], [place] [address]    Report Status 04/27/2021 FINAL  Final  MRSA Next Gen by PCR, Nasal     Status: Abnormal   Collection Time: 04/25/21  5:54 AM   Specimen: Nasal Mucosa; Nasal Swab  Result Value Ref Range Status   MRSA by PCR Next Gen DETECTED (A) NOT DETECTED Final    Comment: RESULT CALLED TO, READ BACK BY AND VERIFIED WITH: RN MICHELLE TOLLER 04/25/21@7 :05 BY TW (NOTE) The GeneXpert MRSA Assay (FDA approved for NASAL specimens only), is one component of a comprehensive MRSA  colonization surveillance program. It is not intended to diagnose MRSA infection nor to guide or monitor treatment for MRSA infections. Test performance is not FDA approved in patients less than 25 years old. Performed at [facility], [address] [place], [place] [address]      Radiology Studies: DG Chest Port 1 View  Result Date: 04/28/2021 CLINICAL DATA:  Abnormal respiration EXAM: PORTABLE CHEST 1 VIEW COMPARISON:  Chest radiograph 04/26/2021 FINDINGS: The patient is rotated to the right. The cardiomediastinal silhouette is grossly similar, allowing for this rotation. Left  basilar consolidation is similar to the prior study. Hazy opacity in the right base has slightly improved. There are small bilateral pleural effusions, not significantly changed. There is no pneumothorax. There is no acute osseous abnormality. IMPRESSION: Small bilateral pleural effusions with adjacent bibasilar airspace disease, slightly improved on the right. Left basilar consolidation is not significantly changed. Electronically Signed   By: Lesia Hausen M.D.   On: 04/28/2021 08:42      Scheduled Meds:  (feeding supplement) PROSource Plus  30 mL Oral BID BM   divalproex  750 mg Oral Q12H   enoxaparin (LOVENOX) injection  95 mg Subcutaneous Q12H   insulin aspart  0-15 Units Subcutaneous TID AC & HS   mouth rinse  15 mL Mouth Rinse BID   midodrine  10 mg Oral TID   multivitamin with minerals  1 tablet Oral Daily   mupirocin ointment  1 application Nasal BID   pantoprazole  40 mg Oral Daily   polyethylene glycol  17 g Oral Daily   risperiDONE  2 mg Oral BID   Continuous Infusions:  sodium chloride Stopped (04/26/21 1647)   amiodarone 30 mg/hr (04/28/21 0400)     LOS: 7 days    Joycelyn Das, MD [facility] Available via Epic secure chat 7am-7pm After these hours, please refer to coverage provider listed on [email or web address] 04/28/2021, 12:44 PM

## 2021-04-29 DIAGNOSIS — J9621 Acute and chronic respiratory failure with hypoxia: Secondary | ICD-10-CM | POA: Diagnosis not present

## 2021-04-29 DIAGNOSIS — J9622 Acute and chronic respiratory failure with hypercapnia: Secondary | ICD-10-CM | POA: Diagnosis not present

## 2021-04-29 DIAGNOSIS — Z91199 Patient's noncompliance with other medical treatment and regimen due to unspecified reason: Secondary | ICD-10-CM

## 2021-04-29 DIAGNOSIS — Z9119 Patient's noncompliance with other medical treatment and regimen: Secondary | ICD-10-CM

## 2021-04-29 DIAGNOSIS — L89304 Pressure ulcer of unspecified buttock, stage 4: Secondary | ICD-10-CM

## 2021-04-29 DIAGNOSIS — G9340 Encephalopathy, unspecified: Secondary | ICD-10-CM | POA: Diagnosis not present

## 2021-04-29 LAB — GLUCOSE, CAPILLARY
Glucose-Capillary: 163 mg/dL — ABNORMAL HIGH (ref 70–99)
Glucose-Capillary: 192 mg/dL — ABNORMAL HIGH (ref 70–99)
Glucose-Capillary: 146 mg/dL — ABNORMAL HIGH (ref 70–99)
Glucose-Capillary: 202 mg/dL — ABNORMAL HIGH (ref 70–99)
Glucose-Capillary: 215 mg/dL — ABNORMAL HIGH (ref 70–99)

## 2021-04-29 LAB — BASIC METABOLIC PANEL
Anion gap: 7 (ref 5–15)
BUN: 15 mg/dL (ref 8–23)
CO2: 40 mmol/L — ABNORMAL HIGH (ref 22–32)
Calcium: 8.2 mg/dL — ABNORMAL LOW (ref 8.9–10.3)
Chloride: 90 mmol/L — ABNORMAL LOW (ref 98–111)
Creatinine, Ser: 0.59 mg/dL (ref 0.44–1.00)
GFR, Estimated: >60 mL/min (ref >60)
Glucose, Bld: 201 mg/dL — ABNORMAL HIGH (ref 70–99)
Potassium: 4.1 mmol/L (ref 3.5–5.1)
Sodium: 137 mmol/L (ref 135–145)

## 2021-04-29 LAB — CBC
HCT: 29.9 % — ABNORMAL LOW (ref 36.0–46.0)
Hemoglobin: 8.9 g/dL — ABNORMAL LOW (ref 12.0–15.0)
MCH: 28.3 pg (ref 26.0–34.0)
MCHC: 29.8 g/dL — ABNORMAL LOW (ref 30.0–36.0)
MCV: 94.9 fL (ref 80.0–100.0)
Platelets: 126 10*3/uL — ABNORMAL LOW (ref 150–400)
RBC: 3.15 MIL/uL — ABNORMAL LOW (ref 3.87–5.11)
RDW: 15.2 % (ref 11.5–15.5)
WBC: 8.4 10*3/uL (ref 4.0–10.5)
nRBC: 0 % (ref 0.0–0.2)

## 2021-04-29 LAB — MAGNESIUM: Magnesium: 2.2 mg/dL (ref 1.7–2.4)

## 2021-04-29 LAB — PHOSPHORUS: Phosphorus: 2.8 mg/dL (ref 2.5–4.6)

## 2021-04-29 NOTE — Progress Notes (Signed)
   Palliative Medicine Inpatient Follow Up Note   Chart reviewed.  Patient assessed at the bedside.  No family present.  Denies pain or shortness of breath.  She is alert to self and location.  Continues to tolerate nasal cannula however refuses to wear BiPAP.  Brother continues to remain hopeful for some improvement.  He is reviewing documents provided to assist with any decision-making (hard choices book).  He wishes to continue to take care day by day and make decisions as they arise.  Strongly encouraged to consider DNR/DNI given patient's continued cycle of respiratory failure due to inability to tolerate BiPAP/noncompliance.  Discussed the importance of continued conversation with family and their  medical providers regarding overall plan of care and treatment options, ensuring decisions are within the context of the patients values and GOCs.  Questions addressed support provided.  Objective Assessment: Vital Signs Vitals:   04/29/21 1300 04/29/21 1400  BP: (!) 101/35 (!) 104/38  Pulse: (!) 52 (!) 50  Resp: (!) 26 (!) 21  Temp:    SpO2: 100% 99%    Intake/Output Summary (Last 24 hours) at 04/29/2021 1458 Last data filed at 04/29/2021 1400 Gross per 24 hour  Intake 218.35 ml  Output 1700 ml  Net -1481.65 ml   Last Weight  Most recent update: 04/29/2021  8:00 AM    Weight  102.6 kg (226 lb 3.1 oz)            Gen:  NAD, awake and alert HEENT: moist mucous membranes CV:  Irregular PULM: clear to auscultation bilaterally. No wheezes/rales/rhonchi ABD: soft/nontender/nondistended/normal bowel sounds EXT: Lateral lower extremity edema, upper extremity edema (left) Neuro: Alert to self and place.  Follows simple commands.  SUMMARY OF RECOMMENDATIONS   -Continue with current plan of care. -There is clear and expressed goals to continue to treat the treatable allow patient every opportunity to continue to thrive and show some improvement/stability.  He is reviewing  decision-making documents that we provided to him during my initial meeting.  Appropriate questions answered. -PMT will continue to support and follow on an as-needed basis.  Please call/secure chat with urgent needs.  May not see daily.  Time Total: 35 min.   Visit consisted of counseling and education dealing with the complex and emotionally intense issues of symptom management and palliative care in the setting of serious and potentially life-threatening illness.Greater than 50%  of this time was spent counseling and coordinating care related to the above assessment and plan.  Willette Alma, AGPCNP-BC  Palliative Medicine Team [phone]  Palliative Medicine Team providers are available by phone from 7am to 7pm daily and can be reached through the team cell phone.  Should this patient require assistance outside of these hours, please call the patient's attending physician.

## 2021-04-29 NOTE — Progress Notes (Signed)
PROGRESS NOTE  Becky [place]    DOB: 1946-04-14, 75 y.o.  MVE:[id number]  PCP: Roderic Scarce, MD   Code Status: Full Code   DOA: 04/21/2021   LOS: 8  Brief Narrative of Current Hospitalization  Becky [place]ricks is a 75 y.o. female with a PMH significant for chronic respiratory failure/OSA noncompliant with CPAP/BiPAP, diabetes mellitus type 2, essential hypertension, paroxysmal atrial fibrillation on Eliquis, bipolar disorder, morbid obesity. They presented from SNF to the ED on 04/21/2021 with AMS thought to be due to hypoxemia/hypercapnia and non-adherence with bipap.  Patient was admitted to medicine service for further workup and management of acute on chronic hypoxemia/hypercapnic respiratory failure as outlined in detail below. PCCM, palliative, psychiatry, and cardiology were consulted.  04/29/21 -transferred to progressive from ICU  Assessment & Plan  Active Problems:   Pressure injury of skin   Acute on chronic respiratory failure with hypoxia and hypercapnia (HCC)   Acute and chronic respiratory failure with hypercapnia (HCC)   Respiratory failure (HCC)  Acute on chronic hypoxic/hypercapnic respiratory failure, POA Acute metabolic encephalopathy, POA Patient is alert and oriented to self/place today. She denies any respiratory complaints. States that the "soft" respiratory treatments are helping but will not use the "hard" breathing treatments (referring to bipap machine) as it is uncomfortable to wear on her face. She's currently on 6L HFNC at 40% FiO2 and has normal respiratory effort.  - transfer to progressive - wean oxygen as tolerated - encourage bipap - continue palliative discussions - incentive spirometry   Hypotension- improved. 105/43, 117/53 this am. Extremities well perfused.  - midodrine 10mg  TID   HFpEF- hypervolemic on exam. Vitals likely cannot tolerate diuresis -Strict intake and output charting - daily weights, closely monitor.   - metoprolol 12.5mg  BID,  with hold parameters for bradycardia, hypotension  Hypokalemia- resolved. 4.1 today Latest potassium of 3.7.   Paroxysmal atrial fibrillation with RVR- rate controlled.  - continue eliquis   Hyperlipidemia  continue Lipitor   Bipolar disorder Psychiatry on board, continue Depakote, risperidone.     Hx COVID-19 infection April 2021.   GERD: Continue Protonix daily.   Pressure injury, sacral stage II ulceration, lumbar right deep tissue injury, nasal stage II pressure injury.  Present on admission.  Continue supportive care, wound care  DVT prophylaxis:  apixaban (ELIQUIS) tablet 5 mg   Diet:  Diet Orders (From admission, onward)     Start     Ordered   04/25/21 1230  Diet heart healthy/carb modified Room service appropriate? Yes; Fluid consistency: Thin  Diet effective now       Question Answer Comment  Diet-HS Snack? Nothing   Room service appropriate? Yes   Fluid consistency: Thin      04/25/21 1229            Subjective 04/29/21    Pt reports concerns about nursing staff. She is confused on orientation to time and setting. She is acutely alert and denies any respiratory complaints. Thinks she is going back to condo in [place].  Disposition Plan & Communication  Status is: Inpatient  Remains inpatient appropriate because:Hemodynamically unstable  Dispo: The patient is from: SNF              Anticipated d/c is to: SNF              Patient currently is not medically stable to d/c.   Difficult to place patient No  Family Communication: brother, 05/01/21 [phone]   Consults, Procedures, Significant Events  Consultants:  Psych, cardiology (signed off 9/12), PCCM  Procedures/significant events:  bipap  Antimicrobials:  Anti-infectives (From admission, onward)    None        Objective   Vitals:   04/29/21 0500 04/29/21 0530 04/29/21 0600 04/29/21 0630  BP: (!) 85/38 (!) 83/49 (!) 90/36 (!) 86/36  Pulse: (!) 53 (!) 54 (!) 53 (!) 53  Resp:  (!) 26 (!) 26 (!) 24 (!) 27  Temp:      TempSrc:      SpO2: 100% 100% 100% 100%  Weight:      Height:        Intake/Output Summary (Last 24 hours) at 04/29/2021 0713 Last data filed at 04/29/2021 0100 Gross per 24 hour  Intake 218.35 ml  Output 1400 ml  Net -1181.65 ml   Filed Weights   04/25/21 0550 04/27/21 0353 04/28/21 0500  Weight: 98.4 kg 101.5 kg 99.3 kg    Patient BMI: Body mass index is 35.33 kg/m.   Physical Exam: General: awake, alert, NAD HEENT: atraumatic, clear conjunctiva, anicteric sclera, moist mucus membranes, hearing grossly normal Respiratory: CTAB, no wheezes, rales or rhonchi, normal respiratory effort. Cardiovascular: irregular heart rhythm, regular rate, positive systolic ejection murmur Gastrointestinal: soft, NT, obese Nervous: A&O x2. no gross focal neurologic deficits Extremities: moves all equally, 1+ LE edema, non-pitting left UE edema with petechia and ecchymosis.  Skin: dry, intact, normal temperature, normal color Psychiatry: mood is dissatisfied.   Labs   I have personally reviewed following labs and imaging studies  Recent Results (from the past 240 hour(s))  Resp Panel by RT-PCR (Flu A&B, Covid) Nasopharyngeal Swab     Status: None   Collection Time: 04/21/21 10:18 AM   Specimen: Nasopharyngeal Swab; Nasopharyngeal(NP) swabs in vial transport medium  Result Value Ref Range Status   SARS Coronavirus 2 by RT PCR NEGATIVE NEGATIVE Final    Comment: (NOTE) SARS-CoV-2 target nucleic acids are NOT DETECTED.  The SARS-CoV-2 RNA is generally detectable in upper respiratory specimens during the acute phase of infection. The lowest concentration of SARS-CoV-2 viral copies this assay can detect is 138 copies/mL. A negative result does not preclude SARS-Cov-2 infection and should not be used as the sole basis for treatment or other patient management decisions. A negative result may occur with  improper specimen collection/handling, submission  of specimen other than nasopharyngeal swab, presence of viral mutation(s) within the areas targeted by this assay, and inadequate number of viral copies(<138 copies/mL). A negative result must be combined with clinical observations, patient history, and epidemiological information. The expected result is Negative.  Fact Sheet for Patients:  BloggerCourse.com  Fact Sheet for Healthcare Providers:  SeriousBroker.it  This test is no t yet approved or cleared by the [place] FDA and  has been authorized for detection and/or diagnosis of SARS-CoV-2 by FDA under an Emergency Use Authorization (EUA). This EUA will remain  in effect (meaning this test can be used) for the duration of the COVID-19 declaration under Section 564(b)(1) of the Act, 21 U.S.C.section 360bbb-3(b)(1), unless the authorization is terminated  or revoked sooner.       Influenza A by PCR NEGATIVE NEGATIVE Final   Influenza B by PCR NEGATIVE NEGATIVE Final    Comment: (NOTE) The Xpert Xpress SARS-CoV-2/FLU/RSV plus assay is intended as an aid in the diagnosis of influenza from Nasopharyngeal swab specimens and should not be used as a sole basis for treatment. Nasal washings and aspirates are unacceptable for Xpert Xpress SARS-CoV-2/FLU/RSV testing.  Fact Sheet for Patients: BloggerCourse.com  Fact Sheet for Healthcare Providers: SeriousBroker.it  This test is not yet approved or cleared by the [place] FDA and has been authorized for detection and/or diagnosis of SARS-CoV-2 by FDA under an Emergency Use Authorization (EUA). This EUA will remain in effect (meaning this test can be used) for the duration of the COVID-19 declaration under Section 564(b)(1) of the Act, 21 U.S.C. section 360bbb-3(b)(1), unless the authorization is terminated or revoked.  Performed at [facility], [address],  [place], [place] [address]   Culture, blood (routine x 2)     Status: None   Collection Time: 04/22/21  4:58 PM   Specimen: BLOOD RIGHT HAND  Result Value Ref Range Status   Specimen Description BLOOD RIGHT HAND  Final   Special Requests   Final    BOTTLES DRAWN AEROBIC ONLY Blood Culture adequate volume   Culture   Final    NO GROWTH 5 DAYS Performed at [facility], [address], [place], [place] [address]    Report Status 04/27/2021 FINAL  Final  Culture, blood (routine x 2)     Status: None   Collection Time: 04/22/21  5:07 PM   Specimen: BLOOD  Result Value Ref Range Status   Specimen Description BLOOD LEFT ANTECUBITAL  Final   Special Requests   Final    BOTTLES DRAWN AEROBIC ONLY Blood Culture adequate volume   Culture   Final    NO GROWTH 5 DAYS Performed at [facility], [address] [place], [place] [address]    Report Status 04/27/2021 FINAL  Final  MRSA Next Gen by PCR, Nasal     Status: Abnormal   Collection Time: 04/25/21  5:54 AM   Specimen: Nasal Mucosa; Nasal Swab  Result Value Ref Range Status   MRSA by PCR Next Gen DETECTED (A) NOT DETECTED Final    Comment: RESULT CALLED TO, READ BACK BY AND VERIFIED WITH: RN MICHELLE TOLLER 04/25/21@7 :05 BY TW (NOTE) The GeneXpert MRSA Assay (FDA approved for NASAL specimens only), is one component of a comprehensive MRSA colonization surveillance program. It is not intended to diagnose MRSA infection nor to guide or monitor treatment for MRSA infections. Test performance is not FDA approved in patients less than 50 years old. Performed at [facility], [address], [place], [place] [address]      Imaging Studies  No results found. Medications   Scheduled Meds:  (feeding supplement) PROSource Plus  30 mL Oral BID BM   apixaban  5 mg Oral BID   atorvastatin  10 mg Oral QHS   divalproex  750 mg Oral Q12H   insulin aspart  0-15 Units Subcutaneous TID AC & HS   mouth rinse  15 mL Mouth Rinse  BID   metoprolol tartrate  12.5 mg Oral BID   midodrine  10 mg Oral TID   multivitamin with minerals  1 tablet Oral Daily   mupirocin ointment  1 application Nasal BID   pantoprazole  40 mg Oral Daily   polyethylene glycol  17 g Oral Daily   risperiDONE  2 mg Oral BID   Continuous Infusions:  sodium chloride Stopped (04/26/21 1647)     LOS: 8 days   Time spent: >83min   Leeroy Bock, DO [facility] 04/29/2021, 7:13 AM   To contact the [facility] Attending or Consulting provider for this patient: Check the care team in [facility] for a)  attending/consulting TRH provider listed and b) the [facility] team listed Log into [email or web address] and use [place]'s universal password to access. If you do not have the password, please contact the hospital operator. Locate the [facility] provider you are looking for under [facility] and page to a number that you can be directly reached. If you still have difficulty reaching the provider, please page the [facility] (Director on Call) for the Hospitalists listed on amion for assistance.

## 2021-04-30 DIAGNOSIS — R4182 Altered mental status, unspecified: Secondary | ICD-10-CM | POA: Diagnosis not present

## 2021-04-30 DIAGNOSIS — J9622 Acute and chronic respiratory failure with hypercapnia: Secondary | ICD-10-CM | POA: Diagnosis not present

## 2021-04-30 DIAGNOSIS — Z9119 Patient's noncompliance with other medical treatment and regimen: Secondary | ICD-10-CM | POA: Diagnosis not present

## 2021-04-30 LAB — BASIC METABOLIC PANEL
Anion gap: 5 (ref 5–15)
BUN: 21 mg/dL (ref 8–23)
CO2: 40 mmol/L — ABNORMAL HIGH (ref 22–32)
Calcium: 8.3 mg/dL — ABNORMAL LOW (ref 8.9–10.3)
Chloride: 90 mmol/L — ABNORMAL LOW (ref 98–111)
Creatinine, Ser: 0.46 mg/dL (ref 0.44–1.00)
GFR, Estimated: >60 mL/min (ref >60)
Glucose, Bld: 213 mg/dL — ABNORMAL HIGH (ref 70–99)
Potassium: 4.9 mmol/L (ref 3.5–5.1)
Sodium: 135 mmol/L (ref 135–145)

## 2021-04-30 LAB — GLUCOSE, CAPILLARY
Glucose-Capillary: 169 mg/dL — ABNORMAL HIGH (ref 70–99)
Glucose-Capillary: 162 mg/dL — ABNORMAL HIGH (ref 70–99)
Glucose-Capillary: 214 mg/dL — ABNORMAL HIGH (ref 70–99)

## 2021-04-30 MED ORDER — METOPROLOL SUCCINATE ER 25 MG PO TB24
12.5000 mg | ORAL_TABLET | Freq: Every day | ORAL | Status: DC
  Administered 2021-05-02 – 2021-05-04 (×2): 12.5 mg via ORAL
  Filled 2021-04-30 (×5): qty 1

## 2021-04-30 NOTE — Progress Notes (Addendum)
PROGRESS NOTE  Becky [place]ricks    DOB: Nov 15, 1945, 75 y.o.  [id number]:[id number]  PCP: Roderic Scarce, MD   Code Status: Full Code   DOA: 04/21/2021   LOS: 9  Brief Narrative of Current Hospitalization  Becky [place]ricks is a 75 y.o. female with a PMH significant for chronic respiratory failure/OSA noncompliant with CPAP/BiPAP, diabetes mellitus type 2, essential hypertension, paroxysmal atrial fibrillation on Eliquis, bipolar disorder, morbid obesity. They presented from SNF to the ED on 04/21/2021 with AMS thought to be due to hypoxemia/hypercapnia and non-adherence with bipap.  Patient was admitted to medicine service for further workup and management of acute on chronic hypoxemia/hypercapnic respiratory failure as outlined in detail below. PCCM, palliative, psychiatry, and cardiology were consulted.  04/30/21 -transferred to progressive from ICU  Assessment & Plan  Active Problems:   Pressure injury of skin   Encephalopathy acute   Acute on chronic respiratory failure with hypoxia and hypercapnia (HCC)   Acute and chronic respiratory failure with hypercapnia (HCC)   Respiratory failure (HCC)   Non compliance with medical treatment  Acute on chronic hypoxic/hypercapnic respiratory failure, POA Acute metabolic encephalopathy, POA alert and intermittently oriented to place/time but is oriented to self. Continues to be on HFNC on my exam with 6L O2.  - transfer to progressive - wean oxygen as tolerated - encourage bipap - continue palliative discussions - incentive spirometry   Hypotension- improved/stable. 116/44 this am. Extremities well perfused. Also asymptomatic sinus bradycardia. - midodrine 10mg  TID - decrease to metoprolol 12.5 daily and monitor HR/BP   HFpEF- hypervolemic on exam. Vitals likely cannot tolerate diuresis -Strict intake and output charting - daily weights, closely monitor.   - metoprolol 12.5mg  daily, with hold parameters for bradycardia, hypotension  Hypokalemia-  resolved. 4.9 today   Paroxysmal atrial fibrillation with RVR- rate controlled.  - continue eliquis   Hyperlipidemia  continue Lipitor   Bipolar disorder Psychiatry on board, continue Depakote, risperidone.     Hx COVID-19 infection April 2021.   GERD: Continue Protonix daily.   Pressure injury, sacral stage II ulceration, lumbar right deep tissue injury, nasal stage II pressure injury.  Present on admission.  Continue supportive care, wound care  DVT prophylaxis:  apixaban (ELIQUIS) tablet 5 mg   Diet:  Diet Orders (From admission, onward)     Start     Ordered   04/25/21 1230  Diet heart healthy/carb modified Room service appropriate? Yes; Fluid consistency: Thin  Diet effective now       Question Answer Comment  Diet-HS Snack? Nothing   Room service appropriate? Yes   Fluid consistency: Thin      04/25/21 1229            Subjective 04/30/21    Patient was resting comfortably and asleep on entrance. She did arouse to voice easily. Did not appear in respiratory distress.   Disposition Plan & Communication  Status is: Inpatient  Remains inpatient appropriate because:Hemodynamically unstable  Dispo: The patient is from: SNF              Anticipated d/c is to: SNF              Patient currently is not medically stable to d/c.   Difficult to place patient No  Family Communication: brother, 05/02/21 [phone]   Consults, Procedures, Significant Events  Consultants:  Psych, cardiology (signed off 9/12), PCCM  Procedures/significant events:  bipap  Antimicrobials:  Anti-infectives (From admission, onward)    None  Objective   Vitals:   04/30/21 0530 04/30/21 0600 04/30/21 0630 04/30/21 0700  BP: (!) 104/45 (!) 107/49 (!) 101/52 (!) 116/44  Pulse: (!) 55 (!) 55 (!) 55 (!) 53  Resp: 19 (!) 24 (!) 21 (!) 22  Temp:      TempSrc:      SpO2: 100% 99% 99% 100%  Weight:      Height:        Intake/Output Summary (Last 24 hours) at 04/30/2021  0715 Last data filed at 04/30/2021 0600 Gross per 24 hour  Intake 960 ml  Output 1650 ml  Net -690 ml    Filed Weights   04/28/21 0500 04/29/21 0500 04/30/21 0500  Weight: 99.3 kg 102.6 kg 104 kg    Patient BMI: Body mass index is 37.01 kg/m.   Physical Exam: General: asleep, NAD HEENT: atraumatic, clear conjunctiva, anicteric sclera, moist mucus membranes, hearing grossly normal Respiratory: CTAB, no wheezes, rales or rhonchi, normal respiratory effort. Cardiovascular: irregular heart rhythm, regular rate, positive systolic ejection murmur Gastrointestinal: soft, NT, obese Nervous: A&O x2. no gross focal neurologic deficits Extremities: moves all equally, 1+ LE edema, non-pitting left UE edema with stable petechia and ecchymosis.  Skin: dry, intact, normal temperature, normal color Psychiatry: mood is dissatisfied.   Labs   I have personally reviewed following labs and imaging studies  Recent Results (from the past 240 hour(s))  Resp Panel by RT-PCR (Flu A&B, Covid) Nasopharyngeal Swab     Status: None   Collection Time: 04/21/21 10:18 AM   Specimen: Nasopharyngeal Swab; Nasopharyngeal(NP) swabs in vial transport medium  Result Value Ref Range Status   SARS Coronavirus 2 by RT PCR NEGATIVE NEGATIVE Final    Comment: (NOTE) SARS-CoV-2 target nucleic acids are NOT DETECTED.  The SARS-CoV-2 RNA is generally detectable in upper respiratory specimens during the acute phase of infection. The lowest concentration of SARS-CoV-2 viral copies this assay can detect is 138 copies/mL. A negative result does not preclude SARS-Cov-2 infection and should not be used as the sole basis for treatment or other patient management decisions. A negative result may occur with  improper specimen collection/handling, submission of specimen other than nasopharyngeal swab, presence of viral mutation(s) within the areas targeted by this assay, and inadequate number of viral copies(<138 copies/mL). A  negative result must be combined with clinical observations, patient history, and epidemiological information. The expected result is Negative.  Fact Sheet for Patients:  BloggerCourse.com  Fact Sheet for Healthcare Providers:  SeriousBroker.it  This test is no t yet approved or cleared by the [place] FDA and  has been authorized for detection and/or diagnosis of SARS-CoV-2 by FDA under an Emergency Use Authorization (EUA). This EUA will remain  in effect (meaning this test can be used) for the duration of the COVID-19 declaration under Section 564(b)(1) of the Act, 21 U.S.C.section 360bbb-3(b)(1), unless the authorization is terminated  or revoked sooner.       Influenza A by PCR NEGATIVE NEGATIVE Final   Influenza B by PCR NEGATIVE NEGATIVE Final    Comment: (NOTE) The Xpert Xpress SARS-CoV-2/FLU/RSV plus assay is intended as an aid in the diagnosis of influenza from Nasopharyngeal swab specimens and should not be used as a sole basis for treatment. Nasal washings and aspirates are unacceptable for Xpert Xpress SARS-CoV-2/FLU/RSV testing.  Fact Sheet for Patients: BloggerCourse.com  Fact Sheet for Healthcare Providers: SeriousBroker.it  This test is not yet approved or cleared by the [place] and has been authorized  for detection and/or diagnosis of SARS-CoV-2 by FDA under an Emergency Use Authorization (EUA). This EUA will remain in effect (meaning this test can be used) for the duration of the COVID-19 declaration under Section 564(b)(1) of the Act, 21 U.S.C. section 360bbb-3(b)(1), unless the authorization is terminated or revoked.  Performed at [facility], [address] [place], [place] [address]   Culture, blood (routine x 2)     Status: None   Collection Time: 04/22/21  4:58 PM   Specimen: BLOOD RIGHT HAND  Result Value Ref Range Status    Specimen Description BLOOD RIGHT HAND  Final   Special Requests   Final    BOTTLES DRAWN AEROBIC ONLY Blood Culture adequate volume   Culture   Final    NO GROWTH 5 DAYS Performed at [facility], [address], [place], [place] [address]    Report Status 04/27/2021 FINAL  Final  Culture, blood (routine x 2)     Status: None   Collection Time: 04/22/21  5:07 PM   Specimen: BLOOD  Result Value Ref Range Status   Specimen Description BLOOD LEFT ANTECUBITAL  Final   Special Requests   Final    BOTTLES DRAWN AEROBIC ONLY Blood Culture adequate volume   Culture   Final    NO GROWTH 5 DAYS Performed at [facility], [address], [place], [place] [address]    Report Status 04/27/2021 FINAL  Final  MRSA Next Gen by PCR, Nasal     Status: Abnormal   Collection Time: 04/25/21  5:54 AM   Specimen: Nasal Mucosa; Nasal Swab  Result Value Ref Range Status   MRSA by PCR Next Gen DETECTED (A) NOT DETECTED Final    Comment: RESULT CALLED TO, READ BACK BY AND VERIFIED WITH: RN MICHELLE TOLLER 04/25/21@7 :05 BY TW (NOTE) The GeneXpert MRSA Assay (FDA approved for NASAL specimens only), is one component of a comprehensive MRSA colonization surveillance program. It is not intended to diagnose MRSA infection nor to guide or monitor treatment for MRSA infections. Test performance is not FDA approved in patients less than 72 years old. Performed at [facility], [address], [place], [place] [address]      Imaging Studies  No results found. Medications   Scheduled Meds:  (feeding supplement) PROSource Plus  30 mL Oral BID BM   apixaban  5 mg Oral BID   atorvastatin  10 mg Oral QHS   divalproex  750 mg Oral Q12H   insulin aspart  0-15 Units Subcutaneous TID AC & HS   mouth rinse  15 mL Mouth Rinse BID   metoprolol tartrate  12.5 mg Oral BID   midodrine  10 mg Oral TID   multivitamin with minerals  1 tablet Oral Daily   pantoprazole  40 mg Oral Daily   polyethylene  glycol  17 g Oral Daily   risperiDONE  2 mg Oral BID   Continuous Infusions:  sodium chloride Stopped (04/26/21 1647)     LOS: 9 days   Time spent: >46min   Leeroy Bock, DO [facility] 04/30/2021, 7:15 AM   To contact the [facility] Attending or Consulting provider for this patient: Check the care team in [facility] for a) attending/consulting TRH provider listed and b) the [facility] team listed Log into [email or web address] and use [place]'s universal password to access. If you do not have the password, please contact the hospital operator. Locate the [facility] provider you are  looking for under [facility] and page to a number that you can be directly reached. If you still have difficulty reaching the provider, please page the [facility] (Director on Call) for the Hospitalists listed on amion for assistance.

## 2021-04-30 NOTE — Progress Notes (Signed)
Pt refuses to have her blood glucose and temperature checked at this time. Pt states that "you will find out where my food went!". Pt informed that her lunch tray was taken away and that dinner will be up in a few hours. Pt continues to refuse to have her blood glucose and temperature checked.

## 2021-05-01 DIAGNOSIS — J9622 Acute and chronic respiratory failure with hypercapnia: Secondary | ICD-10-CM | POA: Diagnosis not present

## 2021-05-01 DIAGNOSIS — J9601 Acute respiratory failure with hypoxia: Secondary | ICD-10-CM

## 2021-05-01 DIAGNOSIS — J9621 Acute and chronic respiratory failure with hypoxia: Secondary | ICD-10-CM | POA: Diagnosis not present

## 2021-05-01 DIAGNOSIS — R4182 Altered mental status, unspecified: Secondary | ICD-10-CM | POA: Diagnosis not present

## 2021-05-01 DIAGNOSIS — G9340 Encephalopathy, unspecified: Secondary | ICD-10-CM

## 2021-05-01 DIAGNOSIS — Z9119 Patient's noncompliance with other medical treatment and regimen: Secondary | ICD-10-CM

## 2021-05-01 DIAGNOSIS — J9602 Acute respiratory failure with hypercapnia: Secondary | ICD-10-CM

## 2021-05-01 LAB — COMPREHENSIVE METABOLIC PANEL
ALT: 9 U/L (ref 0–44)
AST: 10 U/L — ABNORMAL LOW (ref 15–41)
Albumin: 2 g/dL — ABNORMAL LOW (ref 3.5–5.0)
Alkaline Phosphatase: 28 U/L — ABNORMAL LOW (ref 38–126)
Anion gap: 7 (ref 5–15)
BUN: 24 mg/dL — ABNORMAL HIGH (ref 8–23)
CO2: 39 mmol/L — ABNORMAL HIGH (ref 22–32)
Calcium: 8.5 mg/dL — ABNORMAL LOW (ref 8.9–10.3)
Chloride: 92 mmol/L — ABNORMAL LOW (ref 98–111)
Creatinine, Ser: 0.47 mg/dL (ref 0.44–1.00)
GFR, Estimated: >60 mL/min (ref >60)
Glucose, Bld: 104 mg/dL — ABNORMAL HIGH (ref 70–99)
Potassium: 4.6 mmol/L (ref 3.5–5.1)
Sodium: 138 mmol/L (ref 135–145)
Total Bilirubin: 0.4 mg/dL (ref 0.3–1.2)
Total Protein: 5 g/dL — ABNORMAL LOW (ref 6.5–8.1)

## 2021-05-01 LAB — CBC
HCT: 33 % — ABNORMAL LOW (ref 36.0–46.0)
Hemoglobin: 9.9 g/dL — ABNORMAL LOW (ref 12.0–15.0)
MCH: 28.4 pg (ref 26.0–34.0)
MCHC: 30 g/dL (ref 30.0–36.0)
MCV: 94.8 fL (ref 80.0–100.0)
Platelets: 163 10*3/uL (ref 150–400)
RBC: 3.48 MIL/uL — ABNORMAL LOW (ref 3.87–5.11)
RDW: 15 % (ref 11.5–15.5)
WBC: 9.7 10*3/uL (ref 4.0–10.5)
nRBC: 0 % (ref 0.0–0.2)

## 2021-05-01 LAB — GLUCOSE, CAPILLARY
Glucose-Capillary: 209 mg/dL — ABNORMAL HIGH (ref 70–99)
Glucose-Capillary: 169 mg/dL — ABNORMAL HIGH (ref 70–99)
Glucose-Capillary: 196 mg/dL — ABNORMAL HIGH (ref 70–99)
Glucose-Capillary: 233 mg/dL — ABNORMAL HIGH (ref 70–99)

## 2021-05-01 NOTE — Progress Notes (Signed)
PROGRESS NOTE    Becky Hendricks  WUJ:[id number] DOB: 12/20/1945 DOA: 04/21/2021 PCP: Roderic Scarce, MD    Brief Narrative:  Becky Hendricks is a 75 year old female with past medical history significant for chronic respiratory failure/OSA noncompliant with CPAP/BiPAP, DM2, essential hypertension, paroxysmal atrial fibrillation on Eliquis, bipolar disorder, morbid obesity who presented from SNF to [facility] ED on 9/13 with altered mental status.  Personnel at her nursing home found her obtunded in her bed with her BiPAP off; EMS was activated and patient was transported to the ED for further evaluation.    Patient with multiple recent hospitalizations, twice in the last month for similar episodes of hypoxic and hypercapnic respiratory failure due to medical noncompliance with her home BiPAP.  In the ED, ABG notable for decompensated acute on chronic hypercapnic respiratory failure with pH 7.29, PaCO2 100, PaO2 71.  Chest x-ray with similar bilateral lower field changes.  Patient was initially placed on BiPAP with improvement of her ABG with pH 7.32 and PaCO2 of 95 and patient became more awake with agitation and threatening to leave AMA.  Given her recurrent hospitalizations and continued confusion, EDP consulted [facility] for further evaluation and management.   Assessment & Plan:   Active Problems:   Pressure injury of skin   Encephalopathy acute   Acute on chronic respiratory failure with hypoxia and hypercapnia (HCC)   Acute and chronic respiratory failure with hypercapnia (HCC)   Respiratory failure (HCC)   Non compliance with medical treatment   Acute alteration in mental status   Acute on chronic hypoxic/hypercapnic respiratory failure, POA Acute metabolic encephalopathy, POA Patient presenting from her SNF after being found altered, obtunded with BiPAP off.  ABG on admission with PaCO2 of 100.  Etiology likely secondary to her noncompliance with home BiPAP with acute on chronic  hypoxic/hypercapnic respiratory failure; with multiple hospitalizations and requiring intubation over the last month. --Patient continues to refuse BiPAP qHS and while sleeping during the day; continue to encourage use --Continue supple oxygen, maintain SPO2 greater than 88%; on 3 L nasal cannula with SPO2 96% --Overall very poor prognosis given her noncompliance with medical therapy, palliative care following for assistance with goals of care and medical decision making.  Hypotension: Resolved Unclear etiology, patient was initially started on a Cardizem drip for A. fib with RVR which was now weaned off.  Low suspicion for infectious etiology as patient is afebrile without leukocytosis.  Initially required Levophed drip; which was weaned off after starting midodrine. --Midodrine  PO TID --Continue monitor BP closely  Acute on chronic diastolic congestive heart failure --Metoprol sodium bicarb ol tartrate 12.5 mg p.o. twice daily with hold parameters for bradycardia/hypotension --Holding diuretics in the setting of hypotension as above. --Strict I's and O's and daily weights  Hypokalemia: Resolved Potassium 4.6 today.  Paroxysmal atrial fibrillation with RVR TSH within normal limits.  Initially started on a Cardizem drip but was discontinued due to hypotension.  Cardiology was initially consulted, but now signed off 9/12. --Apixaban 5 mg p.o. twice daily --Metoprolol tartrate 12.5 mg p.o. twice daily --Continue monitor on telemetry  HLD: Atorvastatin 10 mg p.o. daily  Bipolar disorder --Psychiatry following, appreciate assistance --Depakote 750 mg p.o. twice daily --Risperdal 2 mg p.o. twice daily  Hx COVID-19 viral infection April 2021.  GERD: Protonix 40 mg PO daily   Stage II sacral pressure injury, POA Right deep lumbar tissue injury, POA Nasal stage II pressure injury, POA Pressure Injury 04/21/21 Sacrum Medial Stage 2 -  Partial thickness  loss of dermis presenting as  a shallow open injury with a red, pink wound bed without slough. yellow, pink, craterous  04/25/21; updated staging, healed, samll opening (Active)  04/21/21 1720  Location: Sacrum  Location Orientation: Medial  Staging: Stage 2 -  Partial thickness loss of dermis presenting as a shallow open injury with a red, pink wound bed without slough.  Wound Description (Comments): yellow, pink, craterous  04/25/21; updated staging, healed, samll opening  Present on Admission: Yes     Pressure Injury 04/24/21 Lumbar Right Deep Tissue Pressure Injury - Purple or maroon localized area of discolored intact skin or blood-filled blister due to damage of underlying soft tissue from pressure and/or shear. (Active)  04/24/21 2000  Location: Lumbar  Location Orientation: Right  Staging: Deep Tissue Pressure Injury - Purple or maroon localized area of discolored intact skin or blood-filled blister due to damage of underlying soft tissue from pressure and/or shear.  Wound Description (Comments):   Present on Admission:      Pressure Injury Nose  Stage 2 -  Partial thickness loss of dermis presenting as a shallow open injury with a red, pink wound bed without slough. scab (Active)     Location: Nose  Location Orientation: -- (bridge of nose)  Staging: Stage 2 -  Partial thickness loss of dermis presenting as a shallow open injury with a red, pink wound bed without slough.  Wound Description (Comments): scab  Present on Admission:   --Supportive/wound care    DVT prophylaxis: Lovenox apixaban (ELIQUIS) tablet 5 mg   Code Status: Full Code Family Communication: No family present at bedside this morning Disposition Plan:  Level of care: Progressive Status is: Inpatient  Remains inpatient appropriate because:Hemodynamically unstable, Persistent severe electrolyte disturbances, Altered mental status, Unsafe d/c plan, IV treatments appropriate due to intensity of illness or inability to take PO, and Inpatient  level of care appropriate due to severity of illness  Dispo: The patient is from: SNF [name]              Anticipated d/c is to: SNF [facility]              Patient currently is medically stable to d/c.   Difficult to place patient No   Consultants:  PCCM - signed off 9/15 Cardiology -signed off 9/12 Psychiatry Palliative care  Procedures:  BiPAP  Antimicrobials:  None   Subjective: Patient seen examined at bedside, resting comfortably.  Continues to refuse to wear BiPAP at night and during the day while sleeping.  RN present at bedside.  No other complaints or concerns at this time.  Denies headache, no chest pain, no palpitations, no shortness of breath, no abdominal pain.  No acute concerns overnight per nursing staff.  Objective: Vitals:   05/01/21 0656 05/01/21 0751 05/01/21 0924 05/01/21 1122  BP: (!) 104/44   (!) 108/51  Pulse: 70 74  66  Resp: 18 20  17   Temp: 99.1 F (37.3 C)   99.1 F (37.3 C)  TempSrc: Oral   Oral  SpO2: 96% 96% 96% 95%  Weight:      Height:        Intake/Output Summary (Last 24 hours) at 05/01/2021 1322 Last data filed at 04/30/2021 2200 Gross per 24 hour  Intake --  Output 550 ml  Net -550 ml   Filed Weights   04/28/21 0500 04/29/21 0500 04/30/21 0500  Weight: 99.3 kg 102.6 kg 104 kg    Examination:  General exam: Alert, NAD, chronically ill in appearance Respiratory system: Clear to auscultation bilaterally, normal respiratory effort, on 3 L nasal cannula with SPO2 97% at rest Cardiovascular system: S1 & S2 heard, irregularly irregular rhythm, normal rate. No JVD, murmurs, rubs, gallops or clicks.  2+ pedal edema bilaterally. Gastrointestinal system: Abdomen is nondistended, soft and nontender. No organomegaly or masses felt. Normal bowel sounds heard. Central nervous system: Alert and oriented, no focal neurological deficit. Extremities: Moving all extremities independently Skin: No rashes, lesions or ulcers Psychiatry:  Judgment and insight poor, depressed mood, flat affect.    Data Reviewed: I have personally reviewed following labs and imaging studies  CBC: Recent Labs  Lab 04/25/21 0500 04/25/21 0626 04/26/21 0159 04/26/21 0853 04/27/21 0434 04/27/21 1521 04/28/21 0241 04/29/21 0417 05/01/21 0308  WBC 8.0  --  7.4  --   --   --  8.4 8.4 9.7  NEUTROABS 4.7  --  4.0  --   --   --   --   --   --   HGB 10.1*   < > 10.9*   < > 10.5* 10.5* 9.1* 8.9* 9.9*  HCT 34.3*   < > 36.8   < > 31.0* 31.0* 30.3* 29.9* 33.0*  MCV 95.8  --  94.4  --   --   --  94.7 94.9 94.8  PLT 114*  --  117*  --   --   --  117* 126* 163   < > = values in this interval not displayed.   Basic Metabolic Panel: Recent Labs  Lab 04/25/21 0500 04/25/21 0626 04/26/21 0159 04/26/21 0853 04/27/21 0140 04/27/21 0434 04/27/21 1521 04/28/21 0241 04/29/21 0417 04/30/21 0253 05/01/21 0308  NA 140   < > 138   < > 136   < > 134* 135 137 135 138  K 3.6   < > 3.2*   < > 4.8   < > 4.1 3.7 4.1 4.9 4.6  CL 90*  --  88*  --  92*  --   --  87* 90* 90* 92*  CO2 42*  --  40*  --  39*  --   --  40* 40* 40* 39*  GLUCOSE 119*  --  199*  --  153*  --   --  187* 201* 213* 104*  BUN 20  --  21  --  21  --   --  15 15 21  24*  CREATININE 0.64  --  0.60  --  0.85  --   --  0.61 0.59 0.46 0.47  CALCIUM 8.6*  --  8.5*  --  8.0*  --   --  8.3* 8.2* 8.3* 8.5*  MG 2.2  --  2.1  --  2.0  --   --  2.2 2.2  --   --   PHOS 3.8  --  2.3*  --  3.3  --   --  2.9 2.8  --   --    < > = values in this interval not displayed.   GFR: Estimated Creatinine Clearance: 76.3 mL/min (by C-G formula based on SCr of 0.47 mg/dL). Liver Function Tests: Recent Labs  Lab 04/25/21 0500 04/26/21 0159 05/01/21 0308  AST 8* 10* 10*  ALT 8 11 9   ALKPHOS 28* 31* 28*  BILITOT 0.9 0.7 0.4  PROT 5.0* 5.3* 5.0*  ALBUMIN 2.2* 2.2* 2.0*   No results for input(s): LIPASE, AMYLASE in the last 168 hours. No results  for input(s): AMMONIA in the last 168  hours.  Coagulation Profile: No results for input(s): INR, PROTIME in the last 168 hours. Cardiac Enzymes: No results for input(s): CKTOTAL, CKMB, CKMBINDEX, TROPONINI in the last 168 hours. BNP (last 3 results) No results for input(s): PROBNP in the last 8760 hours. HbA1C: No results for input(s): HGBA1C in the last 72 hours. CBG: Recent Labs  Lab 04/30/21 0732 04/30/21 1126 04/30/21 2100 05/01/21 0734 05/01/21 1122  GLUCAP 169* 162* 214* 209* 169*   Lipid Profile: No results for input(s): CHOL, HDL, LDLCALC, TRIG, CHOLHDL, LDLDIRECT in the last 72 hours. Thyroid Function Tests: No results for input(s): TSH, T4TOTAL, FREET4, T3FREE, THYROIDAB in the last 72 hours. Anemia Panel: No results for input(s): VITAMINB12, FOLATE, FERRITIN, TIBC, IRON, RETICCTPCT in the last 72 hours. Sepsis Labs: Recent Labs  Lab 04/25/21 0505  PROCALCITON <0.10    Recent Results (from the past 240 hour(s))  Culture, blood (routine x 2)     Status: None   Collection Time: 04/22/21  4:58 PM   Specimen: BLOOD RIGHT HAND  Result Value Ref Range Status   Specimen Description BLOOD RIGHT HAND  Final   Special Requests   Final    BOTTLES DRAWN AEROBIC ONLY Blood Culture adequate volume   Culture   Final    NO GROWTH 5 DAYS Performed at [facility], [address], [place], [place] [address]    Report Status 04/27/2021 FINAL  Final  Culture, blood (routine x 2)     Status: None   Collection Time: 04/22/21  5:07 PM   Specimen: BLOOD  Result Value Ref Range Status   Specimen Description BLOOD LEFT ANTECUBITAL  Final   Special Requests   Final    BOTTLES DRAWN AEROBIC ONLY Blood Culture adequate volume   Culture   Final    NO GROWTH 5 DAYS Performed at [facility], [address] [place], [place] [address]    Report Status 04/27/2021 FINAL  Final  MRSA Next Gen by PCR, Nasal     Status: Abnormal   Collection Time: 04/25/21  5:54 AM   Specimen: Nasal Mucosa; Nasal Swab   Result Value Ref Range Status   MRSA by PCR Next Gen DETECTED (A) NOT DETECTED Final    Comment: RESULT CALLED TO, READ BACK BY AND VERIFIED WITH: RN MICHELLE TOLLER 04/25/21@7 :05 BY TW (NOTE) The GeneXpert MRSA Assay (FDA approved for NASAL specimens only), is one component of a comprehensive MRSA colonization surveillance program. It is not intended to diagnose MRSA infection nor to guide or monitor treatment for MRSA infections. Test performance is not FDA approved in patients less than 75 years old. Performed at [facility], [address], [place], [place] [address]          Radiology Studies: No results found.      Scheduled Meds:  (feeding supplement) PROSource Plus  30 mL Oral BID BM   apixaban  5 mg Oral BID   atorvastatin  10 mg Oral QHS   divalproex  750 mg Oral Q12H   insulin aspart  0-15 Units Subcutaneous TID AC & HS   mouth rinse  15 mL Mouth Rinse BID   metoprolol succinate  12.5 mg Oral Daily   midodrine  10 mg Oral TID   multivitamin with minerals  1 tablet Oral Daily   pantoprazole  40 mg Oral Daily   polyethylene glycol  17 g Oral Daily   risperiDONE  2 mg Oral BID   Continuous Infusions:  sodium chloride Stopped (04/26/21 1647)     LOS: 10 days    Time spent: 39 minutes spent on chart review, discussion with nursing staff, consultants, updating family and interview/physical exam; more than 50% of that time was spent in counseling and/or coordination of care.     [name] [place], DO [facility] Available via Epic secure chat 7am-7pm After these hours, please refer to coverage provider listed on [email or web address] 05/01/2021, 1:22 PM

## 2021-05-01 NOTE — Progress Notes (Signed)
Pt refused bipap for the night.

## 2021-05-01 NOTE — Care Management Important Message (Signed)
Important Message  Patient Details  Name: Becky Hendricks MRN: [id number] Date of Birth: Jun 05, 1946   Medicare Important Message Given:  Yes     Becky Hendricks Stefan Church 05/01/2021, 3:58 PM

## 2021-05-01 NOTE — TOC Progression Note (Addendum)
Transition of Care [facility]) - Progression Note    Patient Details  Name: Becky Hendricks MRN: [id number] Date of Birth: [birth year]  [facility] CM/SW Contact  Terrial Rhodes, LCSWA Phone Number: 05/01/2021, 2:03 PM  Clinical Narrative:     Update- CSW received callback from Star with [facility] who confirmed patient came from there long term. Star confirmed she can accept patient back when medically ready. Star confirmed she ordered BIpap and it is now at [facility].Star request for a wound care consult for patient. CSW notified RN and CM.   CSW left voicemail for Star with [name] to confirm if patient is able to return when medically ready. CSW awaiting callback. CSW will continue to follow and assist with dc planning needs.  Expected Discharge Plan: Long Term Acute Care (LTAC) Barriers to Discharge: Continued Medical Work up  Expected Discharge Plan and Services Expected Discharge Plan: Long Term Acute Care (LTAC)                                               Social Determinants of Health (SDOH) Interventions    Readmission Risk Interventions No flowsheet data found.

## 2021-05-02 ENCOUNTER — Inpatient Hospital Stay (HOSPITAL_COMMUNITY): Payer: Medicare Other

## 2021-05-02 DIAGNOSIS — R4182 Altered mental status, unspecified: Secondary | ICD-10-CM | POA: Diagnosis not present

## 2021-05-02 DIAGNOSIS — R609 Edema, unspecified: Secondary | ICD-10-CM | POA: Diagnosis not present

## 2021-05-02 DIAGNOSIS — G9340 Encephalopathy, unspecified: Secondary | ICD-10-CM | POA: Diagnosis not present

## 2021-05-02 DIAGNOSIS — J9621 Acute and chronic respiratory failure with hypoxia: Secondary | ICD-10-CM | POA: Diagnosis not present

## 2021-05-02 DIAGNOSIS — J9622 Acute and chronic respiratory failure with hypercapnia: Secondary | ICD-10-CM | POA: Diagnosis not present

## 2021-05-02 LAB — GLUCOSE, CAPILLARY
Glucose-Capillary: 152 mg/dL — ABNORMAL HIGH (ref 70–99)
Glucose-Capillary: 213 mg/dL — ABNORMAL HIGH (ref 70–99)
Glucose-Capillary: 184 mg/dL — ABNORMAL HIGH (ref 70–99)
Glucose-Capillary: 243 mg/dL — ABNORMAL HIGH (ref 70–99)

## 2021-05-02 MED ORDER — FUROSEMIDE 10 MG/ML IJ SOLN
40.0000 mg | Freq: Two times a day (BID) | INTRAMUSCULAR | Status: DC
  Administered 2021-05-02 – 2021-05-04 (×5): 40 mg via INTRAVENOUS
  Filled 2021-05-02 (×5): qty 4

## 2021-05-02 NOTE — Progress Notes (Signed)
Upper extremity venous has been completed.   Preliminary results in CV Proc.   Becky Hendricks [name] 05/02/2021 10:02 AM

## 2021-05-02 NOTE — TOC Progression Note (Signed)
Transition of Care [facility]) - Progression Note    Patient Details  Name: Becky Hendricks MRN: [id number] Date of Birth: 1946/01/01  Transition of Care [facility]) CM/SW Contact  Terrial Rhodes, LCSWA Phone Number: 05/02/2021, 3:04 PM  Clinical Narrative:     Patient has SNF bed at [facility] when medically ready for dc. CSW will continue to follow and assist with dc planning needs.  Expected Discharge Plan: Long Term Acute Care (LTAC) Barriers to Discharge: Continued Medical Work up  Expected Discharge Plan and Services Expected Discharge Plan: Long Term Acute Care (LTAC)                                               Social Determinants of Health (SDOH) Interventions    Readmission Risk Interventions No flowsheet data found.

## 2021-05-02 NOTE — Progress Notes (Signed)
PROGRESS NOTE    Becky Hendricks  ZOX:[id number] DOB: Sep 16, 1945 DOA: 04/21/2021 PCP: Roderic Scarce, MD    Brief Narrative:  Becky Hendricks is a 75 year old female with past medical history significant for chronic respiratory failure/OSA noncompliant with CPAP/BiPAP, DM2, essential hypertension, paroxysmal atrial fibrillation on Eliquis, bipolar disorder, morbid obesity who presented from SNF to [facility] ED on 9/13 with altered mental status.  Personnel at her nursing home found her obtunded in her bed with her BiPAP off; EMS was activated and patient was transported to the ED for further evaluation.    Patient with multiple recent hospitalizations, twice in the last month for similar episodes of hypoxic and hypercapnic respiratory failure due to medical noncompliance with her home BiPAP.  In the ED, ABG notable for decompensated acute on chronic hypercapnic respiratory failure with pH 7.29, PaCO2 100, PaO2 71.  Chest x-ray with similar bilateral lower field changes.  Patient was initially placed on BiPAP with improvement of her ABG with pH 7.32 and PaCO2 of 95 and patient became more awake with agitation and threatening to leave AMA.  Given her recurrent hospitalizations and continued confusion, EDP consulted [facility] for further evaluation and management.   Assessment & Plan:   Active Problems:   Pressure injury of skin   Encephalopathy acute   Acute on chronic respiratory failure with hypoxia and hypercapnia (HCC)   Acute and chronic respiratory failure with hypercapnia (HCC)   Respiratory failure (HCC)   Non compliance with medical treatment   Acute alteration in mental status   Acute on chronic hypoxic/hypercapnic respiratory failure, POA Acute metabolic encephalopathy, POA Patient presenting from her SNF after being found altered, obtunded with BiPAP off.  ABG on admission with PaCO2 of 100.  Etiology likely secondary to her noncompliance with home BiPAP with acute on chronic  hypoxic/hypercapnic respiratory failure; with multiple hospitalizations and requiring intubation over the last month. --Patient continues to refuse BiPAP qHS and while sleeping during the day; continue to encourage use --Continue supple oxygen, maintain SPO2 greater than 88%; on 3 L nasal cannula with SPO2 96% --Overall very poor prognosis given her noncompliance with medical therapy, palliative care following for assistance with goals of care and medical decision making.  Hypotension: Resolved Unclear etiology, patient was initially started on a Cardizem drip for A. fib with RVR which was now weaned off.  Low suspicion for infectious etiology as patient is afebrile without leukocytosis.  Initially required Levophed drip; which was weaned off after starting midodrine. --Midodrine 10mg  PO TID --Continue monitor BP closely  Acute on chronic diastolic congestive heart failure --Metoprolol tartrate 12.5 mg p.o. twice daily with hold parameters for bradycardia/hypotension --restart lasix 40mg  IV q12h --Strict I's and O's and daily weights  Hypokalemia: Resolved --repeat BMP in am  Paroxysmal atrial fibrillation with RVR TSH within normal limits.  Initially started on a Cardizem drip but was discontinued due to hypotension.  Cardiology was initially consulted, but now signed off 9/12. --Apixaban 5 mg p.o. twice daily --Metoprolol tartrate 12.5 mg p.o. twice daily --Continue monitor on telemetry  HLD: Atorvastatin 10 mg p.o. daily  Bipolar disorder --Psychiatry following, appreciate assistance --Depakote 750 mg p.o. twice daily --Risperdal 2 mg p.o. twice daily  Hx COVID-19 viral infection April 2021:  GERD: Protonix 40 mg PO daily   Stage II sacral pressure injury, POA Right deep lumbar tissue injury, POA Nasal stage II pressure injury, POA Pressure Injury 04/21/21 Sacrum Medial Stage 2 -  Partial thickness loss of dermis presenting as a  shallow open injury with a red, pink wound bed  without slough. yellow, pink, craterous  04/25/21; updated staging, healed, samll opening (Active)  04/21/21 1720  Location: Sacrum  Location Orientation: Medial  Staging: Stage 2 -  Partial thickness loss of dermis presenting as a shallow open injury with a red, pink wound bed without slough.  Wound Description (Comments): yellow, pink, craterous  04/25/21; updated staging, healed, samll opening  Present on Admission: Yes     Pressure Injury 04/24/21 Lumbar Right Deep Tissue Pressure Injury - Purple or maroon localized area of discolored intact skin or blood-filled blister due to damage of underlying soft tissue from pressure and/or shear. (Active)  04/24/21 2000  Location: Lumbar  Location Orientation: Right  Staging: Deep Tissue Pressure Injury - Purple or maroon localized area of discolored intact skin or blood-filled blister due to damage of underlying soft tissue from pressure and/or shear.  Wound Description (Comments):   Present on Admission:      Pressure Injury Nose  Stage 2 -  Partial thickness loss of dermis presenting as a shallow open injury with a red, pink wound bed without slough. scab (Active)     Location: Nose  Location Orientation: -- (bridge of nose)  Staging: Stage 2 -  Partial thickness loss of dermis presenting as a shallow open injury with a red, pink wound bed without slough.  Wound Description (Comments): scab  Present on Admission:   --Supportive/wound care    DVT prophylaxis: Lovenox apixaban (ELIQUIS) tablet 5 mg   Code Status: Full Code Family Communication: No family present at bedside this morning Disposition Plan:  Level of care: Progressive Status is: Inpatient  Remains inpatient appropriate because:Hemodynamically unstable, Persistent severe electrolyte disturbances, Altered mental status, Unsafe d/c plan, IV treatments appropriate due to intensity of illness or inability to take PO, and Inpatient level of care appropriate due to severity of  illness  Dispo: The patient is from: SNF [name]              Anticipated d/c is to: SNF [facility]              Patient currently is medically stable to d/c.   Difficult to place patient No   Consultants:  PCCM - signed off 9/15 Cardiology -signed off 9/12 Psychiatry Palliative care  Procedures:  BiPAP  Antimicrobials:  None   Subjective: Patient seen examined at bedside, resting comfortably.  Continues to refuse to wear BiPAP at night and during the day while sleeping.  Discussed concern with patient regarding this with her multiple hospitalizations, states she will wear tonight.  Patient also complaining of swelling to her hands and feet.  Restarting diuretics today.  No other complaints or concerns at this time.  Denies headache, no chest pain, no palpitations, no shortness of breath, no abdominal pain.  No acute concerns overnight per nursing staff.  Objective: Vitals:   05/02/21 0500 05/02/21 1109 05/02/21 1300 05/02/21 1332  BP:   (!) 121/55 138/61  Pulse:   64 74  Resp:   (!) 21 17  Temp:   98.5 F (36.9 C) 98.9 F (37.2 C)  TempSrc:   Oral Oral  SpO2:  95% 94% 95%  Weight: 105.1 kg     Height:        Intake/Output Summary (Last 24 hours) at 05/02/2021 1443 Last data filed at 05/02/2021 1300 Gross per 24 hour  Intake --  Output 2701 ml  Net -2701 ml   [employer]  04/29/21 0500 04/30/21 0500 05/02/21 0500  Weight: 102.6 kg 104 kg 105.1 kg    Examination:  General exam: Alert, NAD, chronically ill in appearance Respiratory system: Clear to auscultation bilaterally, normal respiratory effort, on 2 L nasal cannula with SPO2 94% at rest Cardiovascular system: S1 & S2 heard, irregularly irregular rhythm, normal rate. No JVD, murmurs, rubs, gallops or clicks.  2+ pedal edema bilaterally. Gastrointestinal system: Abdomen is nondistended, soft and nontender. No organomegaly or masses felt. Normal bowel sounds heard. Central nervous system: Alert and  oriented, no focal neurological deficit. Extremities: Moving all extremities independently Skin: No rashes, lesions or ulcers Psychiatry: Judgment and insight poor, depressed mood, flat affect.    Data Reviewed: I have personally reviewed following labs and imaging studies  CBC: Recent Labs  Lab 04/26/21 0159 04/26/21 0853 04/27/21 0434 04/27/21 1521 04/28/21 0241 04/29/21 0417 05/01/21 0308  WBC 7.4  --   --   --  8.4 8.4 9.7  NEUTROABS 4.0  --   --   --   --   --   --   HGB 10.9*   < > 10.5* 10.5* 9.1* 8.9* 9.9*  HCT 36.8   < > 31.0* 31.0* 30.3* 29.9* 33.0*  MCV 94.4  --   --   --  94.7 94.9 94.8  PLT 117*  --   --   --  117* 126* 163   < > = values in this interval not displayed.   Basic Metabolic Panel: Recent Labs  Lab 04/26/21 0159 04/26/21 0853 04/27/21 0140 04/27/21 0434 04/27/21 1521 04/28/21 0241 04/29/21 0417 04/30/21 0253 05/01/21 0308  NA 138   < > 136   < > 134* 135 137 135 138  K 3.2*   < > 4.8   < > 4.1 3.7 4.1 4.9 4.6  CL 88*  --  92*  --   --  87* 90* 90* 92*  CO2 40*  --  39*  --   --  40* 40* 40* 39*  GLUCOSE 199*  --  153*  --   --  187* 201* 213* 104*  BUN 21  --  21  --   --  15 15 21  24*  CREATININE 0.60  --  0.85  --   --  0.61 0.59 0.46 0.47  CALCIUM 8.5*  --  8.0*  --   --  8.3* 8.2* 8.3* 8.5*  MG 2.1  --  2.0  --   --  2.2 2.2  --   --   PHOS 2.3*  --  3.3  --   --  2.9 2.8  --   --    < > = values in this interval not displayed.   GFR: Estimated Creatinine Clearance: 76.7 mL/min (by C-G formula based on SCr of 0.47 mg/dL). Liver Function Tests: Recent Labs  Lab 04/26/21 0159 05/01/21 0308  AST 10* 10*  ALT 11 9  ALKPHOS 31* 28*  BILITOT 0.7 0.4  PROT 5.3* 5.0*  ALBUMIN 2.2* 2.0*   No results for input(s): LIPASE, AMYLASE in the last 168 hours. No results for input(s): AMMONIA in the last 168 hours.  Coagulation Profile: No results for input(s): INR, PROTIME in the last 168 hours. Cardiac Enzymes: No results for  input(s): CKTOTAL, CKMB, CKMBINDEX, TROPONINI in the last 168 hours. BNP (last 3 results) No results for input(s): PROBNP in the last 8760 hours. HbA1C: No results for input(s): HGBA1C in the last 72 hours. CBG: Recent  Labs  Lab 05/01/21 1122 05/01/21 1602 05/01/21 2040 05/02/21 0738 05/02/21 1126  GLUCAP 169* 196* 233* 152* 213*   Lipid Profile: No results for input(s): CHOL, HDL, LDLCALC, TRIG, CHOLHDL, LDLDIRECT in the last 72 hours. Thyroid Function Tests: No results for input(s): TSH, T4TOTAL, FREET4, T3FREE, THYROIDAB in the last 72 hours. Anemia Panel: No results for input(s): VITAMINB12, FOLATE, FERRITIN, TIBC, IRON, RETICCTPCT in the last 72 hours. Sepsis Labs: No results for input(s): PROCALCITON, LATICACIDVEN in the last 168 hours.   Recent Results (from the past 240 hour(s))  Culture, blood (routine x 2)     Status: None   Collection Time: 04/22/21  4:58 PM   Specimen: BLOOD RIGHT HAND  Result Value Ref Range Status   Specimen Description BLOOD RIGHT HAND  Final   Special Requests   Final    BOTTLES DRAWN AEROBIC ONLY Blood Culture adequate volume   Culture   Final    NO GROWTH 5 DAYS Performed at [facility], [address], [place], [place] [address]    Report Status 04/27/2021 FINAL  Final  Culture, blood (routine x 2)     Status: None   Collection Time: 04/22/21  5:07 PM   Specimen: BLOOD  Result Value Ref Range Status   Specimen Description BLOOD LEFT ANTECUBITAL  Final   Special Requests   Final    BOTTLES DRAWN AEROBIC ONLY Blood Culture adequate volume   Culture   Final    NO GROWTH 5 DAYS Performed at [facility], [address], [place], [place] [address]    Report Status 04/27/2021 FINAL  Final  MRSA Next Gen by PCR, Nasal     Status: Abnormal   Collection Time: 04/25/21  5:54 AM   Specimen: Nasal Mucosa; Nasal Swab  Result Value Ref Range Status   MRSA by PCR Next Gen DETECTED (A) NOT DETECTED Final    Comment: RESULT CALLED  TO, READ BACK BY AND VERIFIED WITH: RN MICHELLE TOLLER 04/25/21@7 :05 BY TW (NOTE) The GeneXpert MRSA Assay (FDA approved for NASAL specimens only), is one component of a comprehensive MRSA colonization surveillance program. It is not intended to diagnose MRSA infection nor to guide or monitor treatment for MRSA infections. Test performance is not FDA approved in patients less than 63 years old. Performed at [facility], [address] [place], [place] [address]          Radiology Studies: VAS [place] UPPER EXTREMITY VENOUS DUPLEX  Result Date: 05/02/2021 UPPER VENOUS STUDY  Patient Name:  DRU LAUREL  Date of Exam:   05/02/2021 Medical Rec #: [id number]       Accession #:    [id number] Date of Birth: 1946/04/06        Patient Gender: F Patient Age:   74 years Exam Location:  [facility] Procedure:      VAS [place] UPPER EXTREMITY VENOUS DUPLEX Referring Phys: Charles Andringa [place] --------------------------------------------------------------------------------  Indications: Edema Comparison Study: no prior Performing Technologist: [place] Ponder RVS  Examination Guidelines: A complete evaluation includes B-mode imaging, spectral Doppler, color Doppler, and power Doppler as needed of all accessible portions of each vessel. Bilateral testing is considered an integral part of a complete examination. Limited examinations for reoccurring indications may be performed as noted.  Right Findings: +----------+------------+---------+-----------+----------+-------+ RIGHT     CompressiblePhasicitySpontaneousPropertiesSummary +----------+------------+---------+-----------+----------+-------+ IJV           Full       Yes  Yes                      +----------+------------+---------+-----------+----------+-------+ Subclavian    Full       Yes       Yes                      +----------+------------+---------+-----------+----------+-------+ Axillary      Full       Yes       Yes                       +----------+------------+---------+-----------+----------+-------+ Brachial      Full       Yes       Yes                      +----------+------------+---------+-----------+----------+-------+ Radial        Full                                          +----------+------------+---------+-----------+----------+-------+ Ulnar         Full                                          +----------+------------+---------+-----------+----------+-------+ Cephalic      Full                                          +----------+------------+---------+-----------+----------+-------+ Basilic       Full                                          +----------+------------+---------+-----------+----------+-------+  Left Findings: +----------+------------+---------+-----------+----------+-------+ LEFT      CompressiblePhasicitySpontaneousPropertiesSummary +----------+------------+---------+-----------+----------+-------+ Subclavian    Full       Yes       Yes                      +----------+------------+---------+-----------+----------+-------+  Summary:  Right: No evidence of deep vein thrombosis in the upper extremity. No evidence of superficial vein thrombosis in the upper extremity.  Left: No evidence of thrombosis in the subclavian.  *See table(s) above for measurements and observations.    Preliminary         Scheduled Meds:  (feeding supplement) PROSource Plus  30 mL Oral BID BM   apixaban  5 mg Oral BID   atorvastatin  10 mg Oral QHS   divalproex  750 mg Oral Q12H   furosemide  40 mg Intravenous Q12H   insulin aspart  0-15 Units Subcutaneous TID AC & HS   mouth rinse  15 mL Mouth Rinse BID   metoprolol succinate  12.5 mg Oral Daily   midodrine  10 mg Oral TID   multivitamin with minerals  1 tablet Oral Daily   pantoprazole  40 mg Oral Daily   polyethylene glycol  17 g Oral Daily   risperiDONE  2 mg Oral BID   Continuous Infusions:  sodium chloride  Stopped (04/26/21 1647)     LOS: 11 days    Time  spent: 39 minutes spent on chart review, discussion with nursing staff, consultants, updating family and interview/physical exam; more than 50% of that time was spent in counseling and/or coordination of care.     [name] [place], DO [facility] Available via Epic secure chat 7am-7pm After these hours, please refer to coverage provider listed on [email or web address] 05/02/2021, 2:43 PM

## 2021-05-03 DIAGNOSIS — G4733 Obstructive sleep apnea (adult) (pediatric): Secondary | ICD-10-CM | POA: Diagnosis not present

## 2021-05-03 DIAGNOSIS — G9340 Encephalopathy, unspecified: Secondary | ICD-10-CM | POA: Diagnosis not present

## 2021-05-03 DIAGNOSIS — J9622 Acute and chronic respiratory failure with hypercapnia: Secondary | ICD-10-CM | POA: Diagnosis not present

## 2021-05-03 DIAGNOSIS — Z9119 Patient's noncompliance with other medical treatment and regimen: Secondary | ICD-10-CM | POA: Diagnosis not present

## 2021-05-03 LAB — CBC
HCT: 34.4 % — ABNORMAL LOW (ref 36.0–46.0)
Hemoglobin: 10.6 g/dL — ABNORMAL LOW (ref 12.0–15.0)
MCH: 28.5 pg (ref 26.0–34.0)
MCHC: 30.8 g/dL (ref 30.0–36.0)
MCV: 92.5 fL (ref 80.0–100.0)
Platelets: 224 10*3/uL (ref 150–400)
RBC: 3.72 MIL/uL — ABNORMAL LOW (ref 3.87–5.11)
RDW: 15.5 % (ref 11.5–15.5)
WBC: 15.1 10*3/uL — ABNORMAL HIGH (ref 4.0–10.5)
nRBC: 0.1 % (ref 0.0–0.2)

## 2021-05-03 LAB — BASIC METABOLIC PANEL
Anion gap: 8 (ref 5–15)
BUN: 25 mg/dL — ABNORMAL HIGH (ref 8–23)
CO2: 42 mmol/L — ABNORMAL HIGH (ref 22–32)
Calcium: 8.6 mg/dL — ABNORMAL LOW (ref 8.9–10.3)
Chloride: 87 mmol/L — ABNORMAL LOW (ref 98–111)
Creatinine, Ser: 0.6 mg/dL (ref 0.44–1.00)
GFR, Estimated: >60 mL/min (ref >60)
Glucose, Bld: 139 mg/dL — ABNORMAL HIGH (ref 70–99)
Potassium: 4.4 mmol/L (ref 3.5–5.1)
Sodium: 137 mmol/L (ref 135–145)

## 2021-05-03 LAB — GLUCOSE, CAPILLARY
Glucose-Capillary: 228 mg/dL — ABNORMAL HIGH (ref 70–99)
Glucose-Capillary: 242 mg/dL — ABNORMAL HIGH (ref 70–99)
Glucose-Capillary: 211 mg/dL — ABNORMAL HIGH (ref 70–99)
Glucose-Capillary: 173 mg/dL — ABNORMAL HIGH (ref 70–99)

## 2021-05-03 LAB — MAGNESIUM: Magnesium: 1.9 mg/dL (ref 1.7–2.4)

## 2021-05-03 MED ORDER — MIDODRINE HCL 5 MG PO TABS
5.0000 mg | ORAL_TABLET | Freq: Two times a day (BID) | ORAL | Status: DC
  Administered 2021-05-03 – 2021-05-04 (×2): 5 mg via ORAL
  Filled 2021-05-03 (×2): qty 1

## 2021-05-03 NOTE — Progress Notes (Signed)
[facility] PROGRESS NOTE    Progress Note  Becky Hendricks  MGN:[id number] DOB: [birth year] DOA: 04/21/2021 PCP: Roderic Scarce, MD     Brief Narrative:   Becky Hendricks is an 75 y.o. female past medical history significant for chronic respiratory failure/obstructive sleep apnea noncompliant with BiPAP, diabetes mellitus type 2, paroxysmal atrial fibrillation on Eliquis, bipolar disorder presents from skilled nursing facility on 04/25/2021 for altered mental status, in the ED was noted to be in acute on chronic decompensated hypercapnia with respiratory failure with a pH of 7.2 9/100/71 chest x-ray showed bilateral lower field changes initially placed on BiPAP with improvement in his ABG.  After he became more awake he wanted to leave AGAINST MEDICAL ADVICE    Assessment/Plan:   Acute on chronic hypoxic/hypercapnia respiratory failure present on admission/acute metabolic encephalopathy: He was found to be obtunded at the nursing home off BiPAP, ABG showed PaCO2 of 100. He has had multiple similar hospitalizations for this required intubation. Overall prognosis is poor due to his medical noncompliance. Palliative Care has been consulted to follow up on goals of care and medical decision making.  Hypotension: Resolved of unclear etiology was initially placed on IV Cardizem for A. fib with RVR now being weaned off. Initially required Levophed which is being weaned off now on midodrine p.o. 3 times daily.  Head we will go ahead and start titrating down the midodrine as tolerated.  Acute on chronic diastolic heart failure: On metoprolol p.o. twice daily with holding parameters. Started on IV Lasix twice a day. Continue strict I's and O's and daily weights.  These were 4 L. Recheck a basic metabolic panel tomorrow she is becoming hyperchloremic  Hypokalemia: Replete orally now resolved.  Paroxysmal atrial fibrillation with RVR:  Cardiology was initially consulted but signed off on  04/24/2021 currently on apixaban metoprolol with no events on telemetry  Bipolar disorder: Psych was consulted and recommended to continue Depakote and Risperdal twice a day.  Hyperlipidemia: Continue statins.   Sacral decubitus ulcer stage II present on admission RN Pressure Injury Documentation: Pressure Injury 04/21/21 Sacrum Medial Stage 2 -  Partial thickness loss of dermis presenting as a shallow open injury with a red, pink wound bed without slough. yellow, pink, craterous  04/25/21; updated staging, healed, samll opening (Active)  04/21/21 1720  Location: Sacrum  Location Orientation: Medial  Staging: Stage 2 -  Partial thickness loss of dermis presenting as a shallow open injury with a red, pink wound bed without slough.  Wound Description (Comments): yellow, pink, craterous  04/25/21; updated staging, healed, samll opening  Present on Admission: Yes     Pressure Injury 04/24/21 Lumbar Right Deep Tissue Pressure Injury - Purple or maroon localized area of discolored intact skin or blood-filled blister due to damage of underlying soft tissue from pressure and/or shear. (Active)  04/24/21 2000  Location: Lumbar  Location Orientation: Right  Staging: Deep Tissue Pressure Injury - Purple or maroon localized area of discolored intact skin or blood-filled blister due to damage of underlying soft tissue from pressure and/or shear.  Wound Description (Comments):   Present on Admission:      Pressure Injury Nose  Stage 2 -  Partial thickness loss of dermis presenting as a shallow open injury with a red, pink wound bed without slough. scab (Active)     Location: Nose  Location Orientation:   Staging: Stage 2 -  Partial thickness loss of dermis presenting as a shallow open injury with a red, pink wound bed  without slough.  Wound Description (Comments): scab  Present on Admission:      DVT prophylaxis: lovenox Family Communication:none Status is: Inpatient  Remains inpatient  appropriate because:Hemodynamically unstable  Dispo: The patient is from: SNF              Anticipated d/c is to: SNF              Patient currently is not medically stable to d/c.   Difficult to place patient No        Code Status:     Code Status Orders  (From admission, onward)           Start     Ordered   04/21/21 1444  Full code  Continuous        04/21/21 1445           Code Status History     Date Active Date Inactive Code Status Order ID Comments User Context   04/10/2021 1304 04/20/2021 0417 Full Code [id number]  Simonne Martinet, NP ED   04/10/2021 1249 04/10/2021 1304 Full Code [id number]  Emeline General, MD ED   03/30/2021 0027 04/07/2021 2119 Full Code [id number]  Therisa Doyne, MD Inpatient   03/29/2021 2359 03/30/2021 0027 Full Code [id number]  Kirtland Bouchard, MD ED   11/18/2020 0011 12/03/2020 0158 Full Code [id number]  Shalhoub, Deno Lunger, MD ED   05/05/2020 1642 05/25/2020 2220 Full Code [id number]  Pricilla Loveless, MD ED   01/19/2020 1641 01/27/2020 1907 Full Code [id number]  Lupita Leash, MD Inpatient   12/20/2019 1801 01/06/2020 2306 Full Code [id number]  Simonne Martinet, NP ED   12/03/2019 2122 12/18/2019 0214 Full Code [id number]  John Giovanni, MD ED   09/07/2019 1826 09/23/2019 0716 Full Code [id number]  Briant Sites, DO Inpatient      Advance Directive Documentation    Flowsheet Row Most Recent Value  Type of Advance Directive Living will  Pre-existing out of facility DNR order (yellow form or pink MOST form) Pink Most/Yellow Form available - Physician notified to receive inpatient order  "MOST" Form in Place? --         IV Access:   Peripheral IV   Procedures and diagnostic studies:   VAS [place] UPPER EXTREMITY VENOUS DUPLEX  Result Date: 05/02/2021 UPPER VENOUS STUDY  Patient Name:  Becky Hendricks  Date of Exam:   05/02/2021 Medical Rec #: [id number]       Accession #:    [id number] Date of Birth: [birth year]        Patient Gender: F  Patient Age:   50 years Exam Location:  [facility] Procedure:      VAS [place] UPPER EXTREMITY VENOUS DUPLEX Referring Phys: ERIC [place] --------------------------------------------------------------------------------  Indications: Edema Comparison Study: no prior Performing Technologist: [place] Ponder RVS  Examination Guidelines: A complete evaluation includes B-mode imaging, spectral Doppler, color Doppler, and power Doppler as needed of all accessible portions of each vessel. Bilateral testing is considered an integral part of a complete examination. Limited examinations for reoccurring indications may be performed as noted.  Right Findings: +----------+------------+---------+-----------+----------+-------+ RIGHT     CompressiblePhasicitySpontaneousPropertiesSummary +----------+------------+---------+-----------+----------+-------+ IJV           Full       Yes       Yes                      +----------+------------+---------+-----------+----------+-------+ Subclavian    Full  Yes       Yes                      +----------+------------+---------+-----------+----------+-------+ Axillary      Full       Yes       Yes                      +----------+------------+---------+-----------+----------+-------+ Brachial      Full       Yes       Yes                      +----------+------------+---------+-----------+----------+-------+ Radial        Full                                          +----------+------------+---------+-----------+----------+-------+ Ulnar         Full                                          +----------+------------+---------+-----------+----------+-------+ Cephalic      Full                                          +----------+------------+---------+-----------+----------+-------+ Basilic       Full                                          +----------+------------+---------+-----------+----------+-------+  Left Findings:  +----------+------------+---------+-----------+----------+-------+ LEFT      CompressiblePhasicitySpontaneousPropertiesSummary +----------+------------+---------+-----------+----------+-------+ Subclavian    Full       Yes       Yes                      +----------+------------+---------+-----------+----------+-------+  Summary:  Right: No evidence of deep vein thrombosis in the upper extremity. No evidence of superficial vein thrombosis in the upper extremity.  Left: No evidence of thrombosis in the subclavian.  *See table(s) above for measurements and observations.  Diagnosing physician: Waverly Ferrari MD Electronically signed by Waverly Ferrari MD on 05/02/2021 at 4:03:01 PM.    Final      Medical Consultants:   None.   Subjective:    Becky Hendricks feels better.  Objective:    Vitals:   05/02/21 1546 05/02/21 2053 05/03/21 0425 05/03/21 0500  BP: (!) 98/47 (!) 120/50 (!) 141/88   Pulse: (!) 58 68 72   Resp: 18 18 18    Temp: 97.7 F (36.5 C) 99.5 F (37.5 C) 97.9 F (36.6 C)   TempSrc: Axillary Oral Oral   SpO2: 95% 94% 97%   Weight:    103.5 kg  Height:       SpO2: 97 % O2 Flow Rate (L/min): 1 L/min FiO2 (%): 40 %   Intake/Output Summary (Last 24 hours) at 05/03/2021 1120 Last data filed at 05/03/2021 0426 Gross per 24 hour  Intake --  Output 2600 ml  Net -2600 ml   Filed Weights   04/30/21 0500 05/02/21 0500 05/03/21 0500  Weight: 104 kg 105.1 kg 103.5 kg  Exam: General exam: In no acute distress. Respiratory system: Good air movement and clear to auscultation. Cardiovascular system: S1 & S2 heard, RRR. No JVD. Gastrointestinal system: Abdomen is nondistended, soft and nontender.  Extremities: No pedal edema. Skin: No rashes, lesions or ulcers Psychiatry: Judgement and insight appear normal. Mood & affect appropriate.    Data Reviewed:    Labs: Basic Metabolic Panel: Recent Labs  Lab 04/27/21 0140 04/27/21 0434 04/28/21 0241  04/29/21 0417 04/30/21 0253 05/01/21 0308 05/03/21 0254  NA 136   < > 135 137 135 138 137  K 4.8   < > 3.7 4.1 4.9 4.6 4.4  CL 92*  --  87* 90* 90* 92* 87*  CO2 39*  --  40* 40* 40* 39* 42*  GLUCOSE 153*  --  187* 201* 213* 104* 139*  BUN 21  --  15 15 21  24* 25*  CREATININE 0.85  --  0.61 0.59 0.46 0.47 0.60  CALCIUM 8.0*  --  8.3* 8.2* 8.3* 8.5* 8.6*  MG 2.0  --  2.2 2.2  --   --  1.9  PHOS 3.3  --  2.9 2.8  --   --   --    < > = values in this interval not displayed.   GFR Estimated Creatinine Clearance: 76.1 mL/min (by C-G formula based on SCr of 0.6 mg/dL). Liver Function Tests: Recent Labs  Lab 05/01/21 0308  AST 10*  ALT 9  ALKPHOS 28*  BILITOT 0.4  PROT 5.0*  ALBUMIN 2.0*   No results for input(s): LIPASE, AMYLASE in the last 168 hours. No results for input(s): AMMONIA in the last 168 hours. Coagulation profile No results for input(s): INR, PROTIME in the last 168 hours. COVID-19 Labs  No results for input(s): DDIMER, FERRITIN, LDH, CRP in the last 72 hours.  Lab Results  Component Value Date   SARSCOV2NAA NEGATIVE 04/21/2021   SARSCOV2NAA NEGATIVE 04/18/2021   SARSCOV2NAA NEGATIVE 04/10/2021   SARSCOV2NAA NEGATIVE 03/29/2021    CBC: Recent Labs  Lab 04/27/21 1521 04/28/21 0241 04/29/21 0417 05/01/21 0308 05/03/21 0254  WBC  --  8.4 8.4 9.7 15.1*  HGB 10.5* 9.1* 8.9* 9.9* 10.6*  HCT 31.0* 30.3* 29.9* 33.0* 34.4*  MCV  --  94.7 94.9 94.8 92.5  PLT  --  117* 126* 163 224   Cardiac Enzymes: No results for input(s): CKTOTAL, CKMB, CKMBINDEX, TROPONINI in the last 168 hours. BNP (last 3 results) No results for input(s): PROBNP in the last 8760 hours. CBG: Recent Labs  Lab 05/02/21 0738 05/02/21 1126 05/02/21 1720 05/02/21 2139 05/03/21 0907  GLUCAP 152* 213* 184* 243* 228*   D-Dimer: No results for input(s): DDIMER in the last 72 hours. Hgb A1c: No results for input(s): HGBA1C in the last 72 hours. Lipid Profile: No results for  input(s): CHOL, HDL, LDLCALC, TRIG, CHOLHDL, LDLDIRECT in the last 72 hours. Thyroid function studies: No results for input(s): TSH, T4TOTAL, T3FREE, THYROIDAB in the last 72 hours.  Invalid input(s): FREET3 Anemia work up: No results for input(s): VITAMINB12, FOLATE, FERRITIN, TIBC, IRON, RETICCTPCT in the last 72 hours. Sepsis Labs: Recent Labs  Lab 04/28/21 0241 04/29/21 0417 05/01/21 0308 05/03/21 0254  WBC 8.4 8.4 9.7 15.1*   Microbiology Recent Results (from the past 240 hour(s))  MRSA Next Gen by PCR, Nasal     Status: Abnormal   Collection Time: 04/25/21  5:54 AM   Specimen: Nasal Mucosa; Nasal Swab  Result Value Ref Range Status  MRSA by PCR Next Gen DETECTED (A) NOT DETECTED Final    Comment: RESULT CALLED TO, READ BACK BY AND VERIFIED WITH: RN MICHELLE TOLLER 04/25/21@7 :05 BY TW (NOTE) The GeneXpert MRSA Assay (FDA approved for NASAL specimens only), is one component of a comprehensive MRSA colonization surveillance program. It is not intended to diagnose MRSA infection nor to guide or monitor treatment for MRSA infections. Test performance is not FDA approved in patients less than 54 years old. Performed at [facility], [address], [place], [place] [address]      Medications:    (feeding supplement) PROSource Plus  30 mL Oral BID BM   apixaban  5 mg Oral BID   atorvastatin  10 mg Oral QHS   divalproex  750 mg Oral Q12H   furosemide  40 mg Intravenous Q12H   insulin aspart  0-15 Units Subcutaneous TID AC & HS   mouth rinse  15 mL Mouth Rinse BID   metoprolol succinate  12.5 mg Oral Daily   midodrine  10 mg Oral TID   multivitamin with minerals  1 tablet Oral Daily   pantoprazole  40 mg Oral Daily   polyethylene glycol  17 g Oral Daily   risperiDONE  2 mg Oral BID   Continuous Infusions:  sodium chloride Stopped (04/26/21 1647)      LOS: 12 days   Marinda Elk  [facility]  05/03/2021, 11:20 AM

## 2021-05-03 NOTE — Progress Notes (Signed)
Throughout shift pt was A/O x3 but has been very forgetful and anxious. Pt has been yelling into the hallway, but when entering the room she just wants to talk.

## 2021-05-03 NOTE — Progress Notes (Signed)
Pt refusing bipap.

## 2021-05-03 NOTE — Progress Notes (Signed)
Pt refused bipap for the night.

## 2021-05-03 NOTE — TOC Progression Note (Signed)
Transition of Care [facility]) - Progression Note    Patient Details  Name: Becky Hendricks MRN: [id number] Date of Birth: [birth year]  Transition of [facility]) CM/SW Contact  Terrial Rhodes, LCSWA Phone Number: 05/03/2021, 4:20 PM  Clinical Narrative:     Patient has SNF bed at [facility] when medically ready for dc. CSW will continue to follow and assist with dc planning needs.  Expected Discharge Plan: Long Term Acute Care (LTAC) Barriers to Discharge: Continued Medical Work up  Expected Discharge Plan and Services Expected Discharge Plan: Long Term Acute Care (LTAC)                                               Social Determinants of Health (SDOH) Interventions    Readmission Risk Interventions No flowsheet data found.

## 2021-05-04 DIAGNOSIS — J9622 Acute and chronic respiratory failure with hypercapnia: Secondary | ICD-10-CM | POA: Diagnosis not present

## 2021-05-04 DIAGNOSIS — G9340 Encephalopathy, unspecified: Secondary | ICD-10-CM | POA: Diagnosis not present

## 2021-05-04 DIAGNOSIS — G4733 Obstructive sleep apnea (adult) (pediatric): Secondary | ICD-10-CM | POA: Diagnosis not present

## 2021-05-04 DIAGNOSIS — Z9119 Patient's noncompliance with other medical treatment and regimen: Secondary | ICD-10-CM | POA: Diagnosis not present

## 2021-05-04 LAB — BASIC METABOLIC PANEL
Anion gap: 10 (ref 5–15)
BUN: 28 mg/dL — ABNORMAL HIGH (ref 8–23)
CO2: 39 mmol/L — ABNORMAL HIGH (ref 22–32)
Calcium: 8.5 mg/dL — ABNORMAL LOW (ref 8.9–10.3)
Chloride: 86 mmol/L — ABNORMAL LOW (ref 98–111)
Creatinine, Ser: 0.68 mg/dL (ref 0.44–1.00)
GFR, Estimated: >60 mL/min (ref >60)
Glucose, Bld: 139 mg/dL — ABNORMAL HIGH (ref 70–99)
Potassium: 4 mmol/L (ref 3.5–5.1)
Sodium: 135 mmol/L (ref 135–145)

## 2021-05-04 LAB — GLUCOSE, CAPILLARY
Glucose-Capillary: 180 mg/dL — ABNORMAL HIGH (ref 70–99)
Glucose-Capillary: 215 mg/dL — ABNORMAL HIGH (ref 70–99)
Glucose-Capillary: 172 mg/dL — ABNORMAL HIGH (ref 70–99)
Glucose-Capillary: 181 mg/dL — ABNORMAL HIGH (ref 70–99)

## 2021-05-04 MED ORDER — MIDODRINE HCL 5 MG PO TABS
2.5000 mg | ORAL_TABLET | Freq: Two times a day (BID) | ORAL | Status: DC
  Administered 2021-05-04 – 2021-05-05 (×2): 2.5 mg via ORAL
  Filled 2021-05-04 (×2): qty 1

## 2021-05-04 MED ORDER — SPIRONOLACTONE 25 MG PO TABS: 25.0000 mg | ORAL_TABLET | ORAL | Status: DC

## 2021-05-04 MED ORDER — FUROSEMIDE 20 MG PO TABS
20.0000 mg | ORAL_TABLET | Freq: Every day | ORAL | Status: DC
  Filled 2021-05-04: qty 1

## 2021-05-04 MED ORDER — FUROSEMIDE 40 MG PO TABS: 40.0000 mg | ORAL_TABLET | Freq: Every day | ORAL | Status: DC

## 2021-05-04 NOTE — Progress Notes (Signed)
Pt refused bipap tonight.

## 2021-05-04 NOTE — Progress Notes (Signed)
[facility] PROGRESS NOTE    Progress Note  Becky Hendricks  FBP:[id number] DOB: 10/08/1945 DOA: 04/21/2021 PCP: Roderic Scarce, MD     Brief Narrative:   Becky Hendricks is an 75 y.o. female past medical history significant for chronic respiratory failure/obstructive sleep apnea noncompliant with BiPAP, diabetes mellitus type 2, paroxysmal atrial fibrillation on Eliquis, bipolar disorder presents from skilled nursing facility on 04/25/2021 for altered mental status, in the ED was noted to be in acute on chronic decompensated hypercapnia with respiratory failure with a pH of 7.2 9/100/71 chest x-ray showed bilateral lower field changes initially placed on BiPAP with improvement in his ABG.  After he became more awake he wanted to leave AGAINST MEDICAL ADVICE    Assessment/Plan:   Acute on chronic hypoxic/hypercapnia respiratory failure present on admission/acute metabolic encephalopathy: He was found to be obtunded at the nursing home off BiPAP, ABG showed PaCO2 of 100. He has had multiple similar hospitalizations for this required intubation. Overall prognosis is poor due to his medical noncompliance. Palliative Care has been consulted to follow up on goals of care and medical decision making.  Hypotension: Resolved of unclear etiology was initially placed on IV Cardizem for A. fib with RVR now being weaned off. Initially required Levophed which is being weaned off ,now on midodrine low-dose twice a day, Will continue titrate midodrine down.  Acute on chronic diastolic heart failure: Continue oral metoprolol  will transition Lasix to oral twice a day. He is negative about 5-1/2 L. He is becoming hyperchloremic and there is a slight increase in his creatinine.  Hypokalemia: Replete orally now resolved.  Paroxysmal atrial fibrillation with RVR:  Cardiology was initially consulted but signed off on 04/24/2021 currently on apixaban metoprolol with no events on telemetry  Bipolar  disorder: Psych was consulted and recommended to continue Depakote and Risperdal twice a day.  Hyperlipidemia: Continue statins.   Sacral decubitus ulcer stage II present on admission RN Pressure Injury Documentation: Pressure Injury 04/21/21 Sacrum Medial Stage 2 -  Partial thickness loss of dermis presenting as a shallow open injury with a red, pink wound bed without slough. yellow, pink, craterous  04/25/21; updated staging, healed, samll opening (Active)  04/21/21 1720  Location: Sacrum  Location Orientation: Medial  Staging: Stage 2 -  Partial thickness loss of dermis presenting as a shallow open injury with a red, pink wound bed without slough.  Wound Description (Comments): yellow, pink, craterous  04/25/21; updated staging, healed, samll opening  Present on Admission: Yes     Pressure Injury 04/24/21 Lumbar Right Deep Tissue Pressure Injury - Purple or maroon localized area of discolored intact skin or blood-filled blister due to damage of underlying soft tissue from pressure and/or shear. (Active)  04/24/21 2000  Location: Lumbar  Location Orientation: Right  Staging: Deep Tissue Pressure Injury - Purple or maroon localized area of discolored intact skin or blood-filled blister due to damage of underlying soft tissue from pressure and/or shear.  Wound Description (Comments):   Present on Admission:      Pressure Injury Nose  Stage 2 -  Partial thickness loss of dermis presenting as a shallow open injury with a red, pink wound bed without slough. scab (Active)     Location: Nose  Location Orientation:   Staging: Stage 2 -  Partial thickness loss of dermis presenting as a shallow open injury with a red, pink wound bed without slough.  Wound Description (Comments): scab  Present on Admission:  DVT prophylaxis: lovenox Family Communication:none Status is: Inpatient  Remains inpatient appropriate because:Hemodynamically unstable  Dispo: The patient is from: SNF               Anticipated d/c is to: SNF on 05/06/2019              Patient currently is not medically stable to d/c.   Difficult to place patient No        Code Status:     Code Status Orders  (From admission, onward)           Start     Ordered   04/21/21 1444  Full code  Continuous        04/21/21 1445           Code Status History     Date Active Date Inactive Code Status Order ID Comments User Context   04/10/2021 1304 04/20/2021 0417 Full Code [id number]  Simonne Martinet, NP ED   04/10/2021 1249 04/10/2021 1304 Full Code [id number]  Emeline General, MD ED   03/30/2021 0027 04/07/2021 2119 Full Code [id number]  Therisa Doyne, MD Inpatient   03/29/2021 2359 03/30/2021 0027 Full Code [id number]  Kirtland Bouchard, MD ED   11/18/2020 0011 12/03/2020 0158 Full Code [id number]  Shalhoub, Deno Lunger, MD ED   05/05/2020 1642 05/25/2020 2220 Full Code [id number]  Pricilla Loveless, MD ED   01/19/2020 1641 01/27/2020 1907 Full Code [id number]  Lupita Leash, MD Inpatient   12/20/2019 1801 01/06/2020 2306 Full Code [id number]  Simonne Martinet, NP ED   12/03/2019 2122 12/18/2019 0214 Full Code [id number]  John Giovanni, MD ED   09/07/2019 1826 09/23/2019 0716 Full Code [id number]  Briant Sites, DO Inpatient      Advance Directive Documentation    Flowsheet Row Most Recent Value  Type of Advance Directive Living will  Pre-existing out of facility DNR order (yellow form or pink MOST form) Pink Most/Yellow Form available - Physician notified to receive inpatient order  "MOST" Form in Place? --         IV Access:   Peripheral IV   Procedures and diagnostic studies:   No results found.   Medical Consultants:   None.   Subjective:    Janyce Ellinger has no new complaints this morning  Objective:    Vitals:   05/03/21 2010 05/04/21 0140 05/04/21 0522 05/04/21 0819  BP: 101/61 (!) 144/53 (!) 106/49 (!) 124/59  Pulse: (!) 56 (!) 57 (!) 56 76  Resp: 18 20 20 20   Temp:  98.2 F (36.8 C)  98.4 F (36.9 C) 98 F (36.7 C)  TempSrc: Oral  Oral Oral  SpO2: 96% 96% 97% 92%  Weight:   103.6 kg   Height:       SpO2: 92 % O2 Flow Rate (L/min): 1 L/min FiO2 (%): 40 %   Intake/Output Summary (Last 24 hours) at 05/04/2021 1028 Last data filed at 05/04/2021 0800 Gross per 24 hour  Intake 249 ml  Output 1800 ml  Net -1551 ml    Filed Weights   05/02/21 0500 05/03/21 0500 05/04/21 0522  Weight: 105.1 kg 103.5 kg 103.6 kg    Exam: General exam: In no acute distress. Respiratory system: Good air movement and clear to auscultation. Cardiovascular system: S1 & S2 heard, RRR. No JVD. Gastrointestinal system: Abdomen is nondistended, soft and nontender.  Extremities: No pedal edema. Skin: No rashes, lesions or ulcers Psychiatry: Judgement and insight appear normal. Mood &  affect appropriate.   Data Reviewed:    Labs: Basic Metabolic Panel: Recent Labs  Lab 04/28/21 0241 04/29/21 0417 04/30/21 0253 05/01/21 0308 05/03/21 0254 05/04/21 0227  NA 135 137 135 138 137 135  K 3.7 4.1 4.9 4.6 4.4 4.0  CL 87* 90* 90* 92* 87* 86*  CO2 40* 40* 40* 39* 42* 39*  GLUCOSE 187* 201* 213* 104* 139* 139*  BUN 15 15 21  24* 25* 28*  CREATININE 0.61 0.59 0.46 0.47 0.60 0.68  CALCIUM 8.3* 8.2* 8.3* 8.5* 8.6* 8.5*  MG 2.2 2.2  --   --  1.9  --   PHOS 2.9 2.8  --   --   --   --     GFR Estimated Creatinine Clearance: 76.1 mL/min (by C-G formula based on SCr of 0.68 mg/dL). Liver Function Tests: Recent Labs  Lab 05/01/21 0308  AST 10*  ALT 9  ALKPHOS 28*  BILITOT 0.4  PROT 5.0*  ALBUMIN 2.0*    No results for input(s): LIPASE, AMYLASE in the last 168 hours. No results for input(s): AMMONIA in the last 168 hours. Coagulation profile No results for input(s): INR, PROTIME in the last 168 hours. COVID-19 Labs  No results for input(s): DDIMER, FERRITIN, LDH, CRP in the last 72 hours.  Lab Results  Component Value Date   SARSCOV2NAA NEGATIVE  04/21/2021   SARSCOV2NAA NEGATIVE 04/18/2021   SARSCOV2NAA NEGATIVE 04/10/2021   SARSCOV2NAA NEGATIVE 03/29/2021    CBC: Recent Labs  Lab 04/27/21 1521 04/28/21 0241 04/29/21 0417 05/01/21 0308 05/03/21 0254  WBC  --  8.4 8.4 9.7 15.1*  HGB 10.5* 9.1* 8.9* 9.9* 10.6*  HCT 31.0* 30.3* 29.9* 33.0* 34.4*  MCV  --  94.7 94.9 94.8 92.5  PLT  --  117* 126* 163 224    Cardiac Enzymes: No results for input(s): CKTOTAL, CKMB, CKMBINDEX, TROPONINI in the last 168 hours. BNP (last 3 results) No results for input(s): PROBNP in the last 8760 hours. CBG: Recent Labs  Lab 05/03/21 0907 05/03/21 1124 05/03/21 1549 05/03/21 2110 05/04/21 0715  GLUCAP 228* 242* 211* 173* 180*    D-Dimer: No results for input(s): DDIMER in the last 72 hours. Hgb A1c: No results for input(s): HGBA1C in the last 72 hours. Lipid Profile: No results for input(s): CHOL, HDL, LDLCALC, TRIG, CHOLHDL, LDLDIRECT in the last 72 hours. Thyroid function studies: No results for input(s): TSH, T4TOTAL, T3FREE, THYROIDAB in the last 72 hours.  Invalid input(s): FREET3 Anemia work up: No results for input(s): VITAMINB12, FOLATE, FERRITIN, TIBC, IRON, RETICCTPCT in the last 72 hours. Sepsis Labs: Recent Labs  Lab 04/28/21 0241 04/29/21 0417 05/01/21 0308 05/03/21 0254  WBC 8.4 8.4 9.7 15.1*    Microbiology Recent Results (from the past 240 hour(s))  MRSA Next Gen by PCR, Nasal     Status: Abnormal   Collection Time: 04/25/21  5:54 AM   Specimen: Nasal Mucosa; Nasal Swab  Result Value Ref Range Status   MRSA by PCR Next Gen DETECTED (A) NOT DETECTED Final    Comment: RESULT CALLED TO, READ BACK BY AND VERIFIED WITH: RN MICHELLE TOLLER 04/25/21@7 :05 BY TW (NOTE) The GeneXpert MRSA Assay (FDA approved for NASAL specimens only), is one component of a comprehensive MRSA colonization surveillance program. It is not intended to diagnose MRSA infection nor to guide or monitor treatment for MRSA  infections. Test performance is not FDA approved in patients less than 32 years old. Performed at [facility], [address], [place], [place] [address]      Medications:    (feeding supplement) PROSource Plus  30 mL Oral BID BM   apixaban  5 mg Oral BID   atorvastatin  10 mg Oral QHS   divalproex  750 mg Oral Q12H   furosemide  40 mg Intravenous Q12H   insulin aspart  0-15 Units Subcutaneous TID AC & HS   mouth rinse  15 mL Mouth Rinse BID   metoprolol succinate  12.5 mg Oral Daily   midodrine  5 mg Oral BID WC   multivitamin with minerals  1 tablet Oral Daily   pantoprazole  40 mg Oral Daily   polyethylene glycol  17 g Oral Daily   risperiDONE  2 mg Oral BID   Continuous Infusions:  sodium chloride Stopped (04/26/21 1647)      LOS: 13 days   Marinda Elk  [facility]  05/04/2021, 10:28 AM

## 2021-05-04 NOTE — Progress Notes (Signed)
Nutrition Follow-up  DOCUMENTATION CODES:   Obesity unspecified  INTERVENTION:   Encourage intake of meals and supplements. Continue ProSource Plus 30 ml PO BID, each supplement provides 100 kcal and 15 gm protein. Continue Magic cup BID with meals, each supplement provides 290 kcal and 9 grams of protein. Continue MVI with minerals daily.  NUTRITION DIAGNOSIS:   Increased nutrient needs related to wound healing as evidenced by estimated needs.  Ongoing   GOAL:   Patient will meet greater than or equal to 90% of their needs  Progressing   MONITOR:   PO intake, Supplement acceptance, Skin  REASON FOR ASSESSMENT:   Consult COPD Protocol  ASSESSMENT:   75 yo female admitted from SNF with AMS. PMH includes chronic respiratory failure d/t OSA, noncompliant with BiPAP at night, IDDM, HTN, PAF, bipolar disorder.  Patient refusing BiPAP. Meal intakes: 50-100% (average 87% for the past week) She is being offered Prosource Plus BID, refusing sometimes, but taking most times.  Labs and medications reviewed. CBG: 180 this AM Midodrine being titrated down.   Plans for d/c to SNF 9/23.   Diet Order:   Diet Order             Diet heart healthy/carb modified Room service appropriate? Yes; Fluid consistency: Thin  Diet effective now                   EDUCATION NEEDS:   Not appropriate for education at this time  Skin:  Skin Assessment: Skin Integrity Issues: Skin Integrity Issues:: Other (Comment), Stage II, DTI DTI: rt lumbar Stage II: sacrum, nose Unstageable: - Other: IAD labia  Last BM:  9/22  Height:   Ht Readings from Last 1 Encounters:  04/24/21 5\' 6"  (1.676 m)    Weight:   Wt Readings from Last 1 Encounters:  05/04/21 103.6 kg    BMI:  Body mass index is 36.86 kg/m.  Estimated Nutritional Needs:   Kcal:  1800-2000  Protein:  110-130 gm  Fluid:  >/= 1.8 L    05/06/21, RD, LDN, CNSC Please refer to Amion for contact  information.

## 2021-05-05 DIAGNOSIS — G9340 Encephalopathy, unspecified: Secondary | ICD-10-CM | POA: Diagnosis not present

## 2021-05-05 DIAGNOSIS — J9622 Acute and chronic respiratory failure with hypercapnia: Secondary | ICD-10-CM | POA: Diagnosis not present

## 2021-05-05 DIAGNOSIS — Z9119 Patient's noncompliance with other medical treatment and regimen: Secondary | ICD-10-CM | POA: Diagnosis not present

## 2021-05-05 LAB — RESP PANEL BY RT-PCR (FLU A&B, COVID) ARPGX2
Influenza A by PCR: NEGATIVE
Influenza B by PCR: NEGATIVE
SARS Coronavirus 2 by RT PCR: NEGATIVE

## 2021-05-05 LAB — BASIC METABOLIC PANEL
Anion gap: 8 (ref 5–15)
BUN: 32 mg/dL — ABNORMAL HIGH (ref 8–23)
CO2: 39 mmol/L — ABNORMAL HIGH (ref 22–32)
Calcium: 8.5 mg/dL — ABNORMAL LOW (ref 8.9–10.3)
Chloride: 87 mmol/L — ABNORMAL LOW (ref 98–111)
Creatinine, Ser: 0.85 mg/dL (ref 0.44–1.00)
GFR, Estimated: >60 mL/min (ref >60)
Glucose, Bld: 174 mg/dL — ABNORMAL HIGH (ref 70–99)
Potassium: 3.7 mmol/L (ref 3.5–5.1)
Sodium: 134 mmol/L — ABNORMAL LOW (ref 135–145)

## 2021-05-05 LAB — GLUCOSE, CAPILLARY
Glucose-Capillary: 119 mg/dL — ABNORMAL HIGH (ref 70–99)
Glucose-Capillary: 181 mg/dL — ABNORMAL HIGH (ref 70–99)

## 2021-05-05 MED ORDER — MIDODRINE HCL 2.5 MG PO TABS: 2.5000 mg | ORAL_TABLET | Freq: Two times a day (BID) | ORAL | Status: DC

## 2021-05-05 MED ORDER — FUROSEMIDE 20 MG PO TABS
20.0000 mg | ORAL_TABLET | Freq: Every day | ORAL | 0 refills | Status: DC
Start: 2021-05-06 — End: 2022-08-30

## 2021-05-05 NOTE — TOC Progression Note (Addendum)
Transition of Care [facility]) - Progression Note    Patient Details  Name: Becky Hendricks MRN: [id number] Date of Birth: 01/27/1946  Transition of Care [facility]) CM/SW Contact  Terrial Rhodes, LCSWA Phone Number: 05/05/2021, 12:08 PM  Clinical Narrative:     Patient gave CSW permission to speak with her [name] regarding palliative services following patient at SNF.CSW spoke with patients [name] and let him know that MD is recommending for palliative services to follow patient at SNF. Patients brother confirmed he is agreeable for palliative services to follow patient at SNF and chose for CSW to make referral to [facility] for palliative services to follow patient at [facility]. CSW made referral to [facility] with [facility]. CSW spoke with Star who confirmed patient can dc back over today.  CSW left voicemail with Star at [facility] place to return call regarding patients dc. CSW awaiting callback. CSW will continue to follow and assist with dc planning needs.  Expected Discharge Plan: Long Term Acute Care (LTAC) Barriers to Discharge: No Barriers Identified  Expected Discharge Plan and Services Expected Discharge Plan: Long Term Acute Care (LTAC)         Expected Discharge Date: 05/05/21                                     Social Determinants of Health (SDOH) Interventions    Readmission Risk Interventions No flowsheet data found.

## 2021-05-05 NOTE — Discharge Summary (Signed)
Physician Discharge Summary  Becky Hendricks RUE:[id number] DOB: 1946-02-28 DOA: 04/21/2021  PCP: Roderic Scarce, MD  Admit date: 04/21/2021 Discharge date: 05/05/2021  Admitted From: SNF Disposition:  SNF  Recommendations for Outpatient Follow-up:  Follow up with PCP in 1-2 weeks, continue to wean of midodrine. Please obtain BMP/CBC in one week Palliative Care to follow-up at facility to discuss goals of care.  Home Health:no  Equipment/Devices:none   Discharge Condition:Stable CODE STATUS:Full Diet recommendation: Heart Healthy  Brief/Interim Summary: 75 y.o. female past medical history significant for chronic respiratory failure/obstructive sleep apnea noncompliant with BiPAP, diabetes mellitus type 2, paroxysmal atrial fibrillation on Eliquis, bipolar disorder presents from skilled nursing facility on 04/25/2021 for altered mental status, in the ED was noted to be in acute on chronic decompensated hypercapnia with respiratory failure with a pH of 7.2 9/100/71 chest x-ray showed bilateral lower field changes initially placed on BiPAP with improvement in his ABG.  After he became more awake he wanted to leave AGAINST MEDICAL ADVICE  Discharge Diagnoses:  Active Problems:   Pressure injury of skin   Encephalopathy acute   Acute on chronic respiratory failure with hypoxia and hypercapnia (HCC)   Acute and chronic respiratory failure with hypercapnia (HCC)   Respiratory failure (HCC)   Non compliance with medical treatment   Acute alteration in mental status  Acute on chronic respiratory failure with hypoxia and hypercarbia probably due to acute systolic heart failure/acute metabolic encephalopathy: Patient was found obtunded at the nursing home of BiPAP ABG showed PCO2 of 100, the patient has required multiple hospitalizations in the past requiring intubation for this. She was placed on BiPAP and her mentation improved. Overall prognosis poor due to medical noncompliance. Palliative care  has been consulted to follow-up on goals of care and decision making and they will follow-up at facility.  Hypotension: Resolved unclear etiology, question due to A. fib is on admission she was placed on IV Cardizem due to A. fib with RVR now rate control. Initially she required Levophed which she was weaned off and transition to oral midodrine which is being titrated off in house. Can consider titrating down midodrine as an outpatient if tolerated.  Acute on chronic diastolic heart failure: Late due to noncompliance she was continued on metoprolol and placed on IV Lasix she diuresed about 6 L. This was changed to oral Lasix and oral Aldactone which she will continue as an outpatient .  Hypokalemia: This was repleted orally now resolved.  Paroxysmal atrial fibrillation with RVR: Cardiology was consulted initially she was placed on IV diltiazem then transition to oral metoprolol which should continue as an outpatient as no events on telemetry.  Bipolar disorder: Psych was consulted recommended to continue Depakote and risperidone twice a day. No further recommendations.  Hyperlipidemia: Continue statins.  Sacral decubitus ulcer stage II present on admission  Discharge Instructions  Discharge Instructions     Diet - low sodium heart healthy   Complete by: As directed    Increase activity slowly   Complete by: As directed    No wound care   Complete by: As directed       Allergies as of 05/05/2021       Reactions   Chlorhexidine Gluconate Itching   Vancomycin    Vancomycin infusion related reaction- May need Vanc to be given at a slower rate    Acetazolamide Er Rash        Medication List     STOP taking these medications  multivitamin with minerals Tabs tablet   pantoprazole 40 MG tablet Commonly known as: PROTONIX   spironolactone 25 MG tablet Commonly known as: ALDACTONE       TAKE these medications    apixaban 5 MG Tabs tablet Commonly known as:  ELIQUIS Take 5 mg by mouth 2 (two) times daily.   atorvastatin 10 MG tablet Commonly known as: LIPITOR Take 10 mg by mouth at bedtime.   calcium carbonate 500 MG chewable tablet Commonly known as: TUMS - dosed in mg elemental calcium Chew 500 mg by mouth daily.   Combivent Respimat 20-100 MCG/ACT Aers respimat Generic drug: Ipratropium-Albuterol Inhale 1 puff into the lungs every 12 (twelve) hours.   diphenhydrAMINE 2 % cream Commonly known as: BENADRYL Apply 1 application topically 3 (three) times daily as needed for itching.   divalproex 250 MG DR tablet Commonly known as: DEPAKOTE Take 3 tablets (750 mg total) by mouth 2 (two) times daily.   docusate sodium 100 MG capsule Commonly known as: COLACE Take 1 capsule (100 mg total) by mouth 2 (two) times daily as needed for mild constipation.   ferrous sulfate 324 MG Tbec Take 324 mg by mouth daily.   furosemide 20 MG tablet Commonly known as: LASIX Take 1 tablet (20 mg total) by mouth daily. Start taking on: May 06, 2021   insulin lispro 100 UNIT/ML injection Commonly known as: HUMALOG Inject 2-12 Units into the skin in the morning and at bedtime. CBG 70 - 200: 0 units CBG 201 - 250: 2 units CBG 251 - 300: 4 units CBG 301 - 350: 6 units CBG 351 - 400: 8 units CBG 401 - 450: 10 units CBG 451 - 600: 12 units If CBG is > 450, give 12 units, recheck in 2 hours. If still >350, notify provider   Januvia 50 MG tablet Generic drug: sitaGLIPtin Take 50 mg by mouth daily.   Jardiance 10 MG Tabs tablet Generic drug: empagliflozin Take 10 mg by mouth daily.   loperamide 2 MG capsule Commonly known as: IMODIUM Take 1 capsule (2 mg total) by mouth as needed for diarrhea or loose stools.   metoprolol tartrate 25 MG tablet Commonly known as: LOPRESSOR Take 0.5 tablets (12.5 mg total) by mouth 2 (two) times daily. Hold if SBP<110 or DBP<60   polyethylene glycol 17 g packet Commonly known as: MIRALAX / GLYCOLAX Take  17 g by mouth daily.   risperiDONE 2 MG disintegrating tablet Commonly known as: RISPERDAL M-TABS Take 1 tablet (2 mg total) by mouth 2 (two) times daily.   risperiDONE 1 MG disintegrating tablet Commonly known as: RISPERDAL M-TABS Take 1 tablet (1 mg total) by mouth daily.   sennosides-docusate sodium 8.6-50 MG tablet Commonly known as: SENOKOT-S Take 2 tablets by mouth daily. Hold if having loose or frequent stools   tamsulosin 0.4 MG Caps capsule Commonly known as: FLOMAX Take 1 capsule (0.4 mg total) by mouth every other day.        Allergies  Allergen Reactions   Chlorhexidine Gluconate Itching   Vancomycin     Vancomycin infusion related reaction- May need Vanc to be given at a slower rate    Acetazolamide Er Rash    Consultations: Cardiology Palliative care   Procedures/Studies: CT Head Wo Contrast  Result Date: 04/10/2021 CLINICAL DATA:  Altered mental status.  Unable to move right arm. EXAM: CT HEAD WITHOUT CONTRAST TECHNIQUE: Contiguous axial images were obtained from the base of the skull through the vertex without  intravenous contrast. COMPARISON:  04/02/2021 FINDINGS: Brain: There is no evidence for acute hemorrhage, hydrocephalus, mass lesion, or abnormal extra-axial fluid collection. No definite CT evidence for acute infarction. Diffuse loss of parenchymal volume is consistent with atrophy. Patchy low attenuation in the deep hemispheric and periventricular white matter is nonspecific, but likely reflects chronic microvascular ischemic demyelination. Vascular: No hyperdense vessel or unexpected calcification. Skull: No evidence for fracture. No worrisome lytic or sclerotic lesion. Sinuses/Orbits: Tiny air-fluid level in the right sphenoid sinus is similar in the interval. Visualized portions of the globes and intraorbital fat are unremarkable. Other: None. IMPRESSION: 1. No acute intracranial abnormality. 2. Atrophy with chronic small vessel white matter ischemic  disease. 3. Stable tiny air-fluid level in the right sphenoid sinus. Acute sinusitis would be a consideration. Electronically Signed   By: Kennith Center M.D.   On: 04/10/2021 12:03   DG Chest Port 1 View  Result Date: 04/28/2021 CLINICAL DATA:  Abnormal respiration EXAM: PORTABLE CHEST 1 VIEW COMPARISON:  Chest radiograph 04/26/2021 FINDINGS: The patient is rotated to the right. The cardiomediastinal silhouette is grossly similar, allowing for this rotation. Left basilar consolidation is similar to the prior study. Hazy opacity in the right base has slightly improved. There are small bilateral pleural effusions, not significantly changed. There is no pneumothorax. There is no acute osseous abnormality. IMPRESSION: Small bilateral pleural effusions with adjacent bibasilar airspace disease, slightly improved on the right. Left basilar consolidation is not significantly changed. Electronically Signed   By: Lesia Hausen M.D.   On: 04/28/2021 08:42   DG CHEST PORT 1 VIEW  Result Date: 04/26/2021 CLINICAL DATA:  Shortness of breath EXAM: PORTABLE CHEST 1 VIEW COMPARISON:  04/25/2021 FINDINGS: Heart is borderline in size. Bilateral lower lobe airspace opacities and probable layering effusions. No acute bony abnormality. IMPRESSION: Layering bilateral effusions with bibasilar atelectasis or infiltrates. Worsening aeration at the left base since prior study. Electronically Signed   By: Charlett Nose M.D.   On: 04/26/2021 08:50   DG CHEST PORT 1 VIEW  Result Date: 04/25/2021 CLINICAL DATA:  Shortness of breath. EXAM: PORTABLE CHEST 1 VIEW COMPARISON:  April 23, 2021. FINDINGS: Stable cardiomediastinal silhouette. No pneumothorax is noted. Left lung is clear. Stable elevated right hemidiaphragm is noted with right basilar atelectasis or infiltrate and associated pleural effusion. Bony thorax is unremarkable. IMPRESSION: Stable elevated right hemidiaphragm is noted with right basilar atelectasis or infiltrate and  associated pleural effusion. Electronically Signed   By: Lupita Raider M.D.   On: 04/25/2021 09:37   DG CHEST PORT 1 VIEW  Result Date: 04/23/2021 CLINICAL DATA:  Evaluate atelectasis and right pleural effusion EXAM: PORTABLE CHEST 1 VIEW COMPARISON:  04/22/2021 FINDINGS: Midline trachea. Cardiomegaly accentuated by AP portable technique. Increase in small to moderate right pleural effusion. No pneumothorax. Pulmonary interstitial prominence persists. Persistent right base airspace disease. Improved left-sided aeration. IMPRESSION: Slight increase in right-sided pleural effusion with increased adjacent right base airspace disease. Further improvement in left base aeration. Cardiomegaly with improved mild pulmonary venous congestion. Electronically Signed   By: Jeronimo Greaves M.D.   On: 04/23/2021 10:20   DG Chest Port 1 View  Result Date: 04/22/2021 CLINICAL DATA:  Hypoxia EXAM: PORTABLE CHEST 1 VIEW COMPARISON:  Yesterday FINDINGS: Mildly degraded exam due to AP portable technique and patient body habitus. Patient rotated to the right. Moderate cardiomegaly, accentuated by AP portable technique. Small right pleural effusion persists. No pneumothorax. Pulmonary interstitial thickening. improved left base aeration with persistent right lower  lobe consolidation. IMPRESSION: Improved left base aeration. Persistent right base atelectasis or infection with adjacent small right pleural effusion. Cardiomegaly with interstitial thickening, for which pulmonary venous congestion is a concern. Electronically Signed   By: Jeronimo Greaves M.D.   On: 04/22/2021 10:46   DG Chest Port 1 View  Result Date: 04/21/2021 CLINICAL DATA:  Shortness of breath.  Respiratory distress. EXAM: PORTABLE CHEST 1 VIEW COMPARISON:  04/12/2021 FINDINGS: Again noted are bibasilar chest densities. Slightly increased densities at the left lung base. Heart appears to be enlarged but may be accentuated by the AP portable technique. Negative for  pneumothorax. Degenerative changes at the left [facility] joint. IMPRESSION: Persistent bibasilar chest densities with slightly increased disease at the left lung base. Findings could represent atelectasis or infection. Electronically Signed   By: Richarda Overlie M.D.   On: 04/21/2021 11:46   DG CHEST PORT 1 VIEW  Result Date: 04/12/2021 CLINICAL DATA:  Acute respiratory failure with hypoxia. EXAM: PORTABLE CHEST 1 VIEW COMPARISON:  Chest x-ray 04/10/2021 FINDINGS: Heart is enlarged. Right greater than left lower lobe airspace disease and effusion again noted. Pulmonary congestion is stable. There is no significant interval change. IMPRESSION: 1. Stable appearance of right greater than left lower lobe airspace disease and effusion. 2. Stable cardiomegaly and pulmonary congestion. Electronically Signed   By: Marin Roberts M.D.   On: 04/12/2021 07:55   DG Chest Port 1 View  Result Date: 04/10/2021 CLINICAL DATA:  75 year old female with history of shortness of breath, weakness and fatigue. Elevated blood pressure. EXAM: PORTABLE CHEST 1 VIEW COMPARISON:  Chest x-ray 04/01/2021. FINDINGS: Chronic elevation of the right hemidiaphragm, similar to the prior study. Bibasilar opacities (right greater than left) which may reflect areas of atelectasis and/or consolidation. No definite pleural effusions. No pneumothorax. No evidence of pulmonary edema. Heart size appears borderline to mildly enlarged, likely accentuated by low lung volumes and portable AP technique. Upper mediastinal contours are within normal limits allowing for patient positioning. IMPRESSION: 1. Persistent low lung volumes with chronic elevation of the right hemidiaphragm and bibasilar opacities (right greater than left) which may reflect areas of atelectasis and/or consolidation. Electronically Signed   By: Trudie Reed M.D.   On: 04/10/2021 11:32   VAS [place] UPPER EXTREMITY VENOUS DUPLEX  Result Date: 05/02/2021 UPPER VENOUS STUDY  Patient Name:   TONIANN DICKERSON  Date of Exam:   05/02/2021 Medical Rec #: [id number]       Accession #:    [id number] Date of Birth: [birth year]        Patient Gender: F Patient Age:   68 years Exam Location:  [facility] Procedure:      VAS [place] UPPER EXTREMITY VENOUS DUPLEX Referring Phys: ERIC [place] --------------------------------------------------------------------------------  Indications: Edema Comparison Study: no prior Performing Technologist: [place] Ponder RVS  Examination Guidelines: A complete evaluation includes B-mode imaging, spectral Doppler, color Doppler, and power Doppler as needed of all accessible portions of each vessel. Bilateral testing is considered an integral part of a complete examination. Limited examinations for reoccurring indications may be performed as noted.  Right Findings: +----------+------------+---------+-----------+----------+-------+ RIGHT     CompressiblePhasicitySpontaneousPropertiesSummary +----------+------------+---------+-----------+----------+-------+ IJV           Full       Yes       Yes                      +----------+------------+---------+-----------+----------+-------+ Subclavian    Full       Yes  Yes                      +----------+------------+---------+-----------+----------+-------+ Axillary      Full       Yes       Yes                      +----------+------------+---------+-----------+----------+-------+ Brachial      Full       Yes       Yes                      +----------+------------+---------+-----------+----------+-------+ Radial        Full                                          +----------+------------+---------+-----------+----------+-------+ Ulnar         Full                                          +----------+------------+---------+-----------+----------+-------+ Cephalic      Full                                          +----------+------------+---------+-----------+----------+-------+  Basilic       Full                                          +----------+------------+---------+-----------+----------+-------+  Left Findings: +----------+------------+---------+-----------+----------+-------+ LEFT      CompressiblePhasicitySpontaneousPropertiesSummary +----------+------------+---------+-----------+----------+-------+ Subclavian    Full       Yes       Yes                      +----------+------------+---------+-----------+----------+-------+  Summary:  Right: No evidence of deep vein thrombosis in the upper extremity. No evidence of superficial vein thrombosis in the upper extremity.  Left: No evidence of thrombosis in the subclavian.  *See table(s) above for measurements and observations.  Diagnosing physician: Waverly Ferrari MD Electronically signed by Waverly Ferrari MD on 05/02/2021 at 4:03:01 PM.    Final    (Echo, Carotid, EGD, Colonoscopy, ERCP)    Subjective: No complaints  Discharge Exam: Vitals:   05/05/21 0459 05/05/21 0824  BP: (!) 146/53 (!) 104/47  Pulse: (!) 56 (!) 56  Resp: 19   Temp: 98.6 F (37 C)   SpO2: 98%    Vitals:   05/05/21 0054 05/05/21 0400 05/05/21 0459 05/05/21 0824  BP: (!) 123/50  (!) 146/53 (!) 104/47  Pulse: (!) 56  (!) 56 (!) 56  Resp: 20  19   Temp: 98.2 F (36.8 C)  98.6 F (37 C)   TempSrc: Oral  Oral   SpO2: 98%  98%   Weight:  99.2 kg    Height:        General: Pt is alert, awake, not in acute distress Cardiovascular: RRR, S1/S2 +, no rubs, no gallops Respiratory: CTA bilaterally, no wheezing, no rhonchi Abdominal: Soft, NT, ND, bowel sounds + Extremities: no edema, no cyanosis    The results of significant diagnostics from  this hospitalization (including imaging, microbiology, ancillary and laboratory) are listed below for reference.     Microbiology: No results found for this or any previous visit (from the past 240 hour(s)).   Labs: BNP (last 3 results) Recent Labs     04/11/21 0220 04/21/21 0944 04/25/21 0505  BNP 181.0* 154.5* 305.1*   Basic Metabolic Panel: Recent Labs  Lab 04/29/21 0417 04/30/21 0253 05/01/21 0308 05/03/21 0254 05/04/21 0227 05/05/21 0055  NA 137 135 138 137 135 134*  K 4.1 4.9 4.6 4.4 4.0 3.7  CL 90* 90* 92* 87* 86* 87*  CO2 40* 40* 39* 42* 39* 39*  GLUCOSE 201* 213* 104* 139* 139* 174*  BUN 15 21 24* 25* 28* 32*  CREATININE 0.59 0.46 0.47 0.60 0.68 0.85  CALCIUM 8.2* 8.3* 8.5* 8.6* 8.5* 8.5*  MG 2.2  --   --  1.9  --   --   PHOS 2.8  --   --   --   --   --    Liver Function Tests: Recent Labs  Lab 05/01/21 0308  AST 10*  ALT 9  ALKPHOS 28*  BILITOT 0.4  PROT 5.0*  ALBUMIN 2.0*   No results for input(s): LIPASE, AMYLASE in the last 168 hours. No results for input(s): AMMONIA in the last 168 hours. CBC: Recent Labs  Lab 04/29/21 0417 05/01/21 0308 05/03/21 0254  WBC 8.4 9.7 15.1*  HGB 8.9* 9.9* 10.6*  HCT 29.9* 33.0* 34.4*  MCV 94.9 94.8 92.5  PLT 126* 163 224   Cardiac Enzymes: No results for input(s): CKTOTAL, CKMB, CKMBINDEX, TROPONINI in the last 168 hours. BNP: Invalid input(s): POCBNP CBG: Recent Labs  Lab 05/04/21 0715 05/04/21 1145 05/04/21 1658 05/04/21 2043 05/05/21 0717  GLUCAP 180* 215* 172* 181* 119*   D-Dimer No results for input(s): DDIMER in the last 72 hours. Hgb A1c No results for input(s): HGBA1C in the last 72 hours. Lipid Profile No results for input(s): CHOL, HDL, LDLCALC, TRIG, CHOLHDL, LDLDIRECT in the last 72 hours. Thyroid function studies No results for input(s): TSH, T4TOTAL, T3FREE, THYROIDAB in the last 72 hours.  Invalid input(s): FREET3 Anemia work up No results for input(s): VITAMINB12, FOLATE, FERRITIN, TIBC, IRON, RETICCTPCT in the last 72 hours. Urinalysis    Component Value Date/Time   COLORURINE YELLOW 04/25/2021 1815   APPEARANCEUR CLEAR 04/25/2021 1815   LABSPEC 1.032 (H) 04/25/2021 1815   PHURINE 6.0 04/25/2021 1815   GLUCOSEU >=500 (A)  04/25/2021 1815   HGBUR NEGATIVE 04/25/2021 1815   BILIRUBINUR NEGATIVE 04/25/2021 1815   KETONESUR 20 (A) 04/25/2021 1815   PROTEINUR NEGATIVE 04/25/2021 1815   NITRITE NEGATIVE 04/25/2021 1815   LEUKOCYTESUR NEGATIVE 04/25/2021 1815   Sepsis Labs Invalid input(s): PROCALCITONIN,  WBC,  LACTICIDVEN Microbiology No results found for this or any previous visit (from the past 240 hour(s)).    SIGNED:   Marinda Elk, MD  [facility] 05/05/2021, 9:19 AM Pager   If 7PM-7AM, please contact night-coverage [email or web address] Password [email or web address]

## 2021-05-05 NOTE — Progress Notes (Signed)
Pt going back to [place]. Report called in.

## 2021-05-05 NOTE — Progress Notes (Signed)
MD notified of pt's sustained HR in the 40s while pt was in and out of sleep. MD advised to continue D/C.

## 2021-05-05 NOTE — Progress Notes (Signed)
[facility] ([facility])  Hospital Liaison: RN note         This patient has been referred to our palliative care services in the community.  [facility] will continue to follow for any discharge planning needs and to coordinate continuation of palliative care in the outpatient setting.    If you have questions or need assistance, please call [phone] or contact the hospital Liaison listed on AMION.      Thank you for this referral.         Mary Anne Robertson, RN, CCM  [facility] Hospital Liaison   [phone] 

## 2021-05-05 NOTE — TOC Transition Note (Signed)
Transition of Care [facility]) - CM/SW Discharge Note   Patient Details  Name: Becky Hendricks MRN: [id number] Date of Birth: 05/24/1946  [facility] of [facility]) CM/SW Contact:  Terrial Rhodes, LCSWA Phone Number: 05/05/2021, 12:27 PM   Clinical Narrative:     Patient will DC to: [name]  Anticipated DC date: 05/05/2021  Family notified:  Becky Hendricks   Transport by: Becky Hendricks  ?  Per MD patient ready for DC to [name] with palliative services to follow . RN, patient, patient's family, Macon Large with [facility], and facility notified of DC. Bipap has been ordered and  is at [id number] [facility]. Facility confirmed. Discharge Summary sent to facility. RN given number for report tele# [phone] RM# [id number]. DC packet on chart. Ambulance transport requested for patient.  CSW signing off.   Final next level of care: Skilled Nursing Facility Barriers to Discharge: No Barriers Identified   Patient Goals and CMS Choice Patient states their goals for this hospitalization and ongoing recovery are:: SNF CMS Medicare.gov Compare Post Acute Care list provided to:: Patient Choice offered to / list presented to : Patient  Discharge Placement              Patient chooses bed at: [facility] Patient to be transferred to facility by: [facility] Name of family member notified: Becky Hendricks Patient and family notified of of transfer: 05/05/21  Discharge Plan and Services                                     Social Determinants of Health (SDOH) Interventions     Readmission Risk Interventions No flowsheet data found.

## 2021-11-29 ENCOUNTER — Non-Acute Institutional Stay: Payer: Medicare Other | Admitting: Internal Medicine

## 2021-11-29 ENCOUNTER — Encounter: Payer: Self-pay | Admitting: Internal Medicine

## 2021-11-29 VITALS — BP 130/72 | HR 87 | Temp 97.9°F | Resp 18 | Ht 66.0 in | Wt 218.2 lb

## 2021-11-29 DIAGNOSIS — F05 Delirium due to known physiological condition: Secondary | ICD-10-CM

## 2021-11-29 DIAGNOSIS — Z515 Encounter for palliative care: Secondary | ICD-10-CM

## 2021-11-29 DIAGNOSIS — J9611 Chronic respiratory failure with hypoxia: Secondary | ICD-10-CM

## 2021-11-29 DIAGNOSIS — I5032 Chronic diastolic (congestive) heart failure: Secondary | ICD-10-CM

## 2021-11-29 NOTE — Progress Notes (Signed)
[facility] Palliative Care Follow-Up Visit Telephone: [phone]  Fax: [phone]   Date of encounter: 11/29/21 11:51 AM PATIENT NAME: Becky Hendricks [address] Becky Hendricks [address]   [phone] (home)  DOB: April 29, 1946 MRN: [id number] PRIMARY CARE PROVIDER:    [name], MD,  [address] [place] [address] [phone]  REFERRING PROVIDER:   Dr. Jules Hendricks   RESPONSIBLE PARTY:    Contact Information     Name Relation Home Work Mobile   Becky, Hendricks FVCBSWH [phone]          I met face to face with patient and family in [facility]. Palliative Care was asked to follow this patient by consultation request of  Dr. Jules Hendricks to address advance care planning and complex medical decision making. This is follow-up visit.                                     ASSESSMENT AND PLAN / RECOMMENDATIONS:   Advance Care Planning/Goals of Care: Goals include to maximize quality of life and symptom management. Patient/health care surrogate gave his/her permission to discuss.Our advance care planning conversation included a discussion about:    The value and importance of advance care planning  Experiences with loved ones who have been seriously ill or have died  Exploration of personal, cultural or spiritual beliefs that might influence medical decisions  Exploration of goals of care in the event of a sudden injury or illness  Identification  of a healthcare agent  Review and updating or creation of an  advance directive document . Decision not to resuscitate or to de-escalate disease focused treatments due to poor prognosis. CODE STATUS:  FULL CODE, full scope, abx, IVF and feeding tube even long-term if indicated completed in 01/2020 and confirmed on multiple occasions with pt and brother, Becky Hendricks  Symptom Management/Plan: 1. Persistent hypoactive delirium due to  another medical condition -pt remains very drowsy, lethargic--barely opened eyes and went back to sleep--I could not lift her arm to check her BP myself, but checked temp, pox and pulse which were all wnl despite how she appeared -reviewed Becky Hendricks health notes and these indicated that her medications were being adjusted (bipolar disorder) and her lethargy had also been noted by her brother so he'd brought it up in care plan -due to pt's rhonchi, I did order a pCXR 2 view which showed possibly a small amount of pulmonary edema, but she had only gained 2 lbs vs prior weight and sats wnl so I did not order increased diuretics -nursing was relaying results to facility NP to request her to look at her again  -hoping to avoid another admission for her  2. Chronic respiratory failure with hypoxia and hypercapnia (HCC) -continue bipap for sleep apnea at hs and O2 during day -? Some hypercapnia if removing mask  3. Chronic diastolic CHF (congestive heart failure) (HCC) -may have slight volume overload, but I didn't have access to recent labs so I was not comfortable changing diuretics and deferred to facility providers  4. Palliative care by specialist -pt declined considerably vs last visits -she was in bed, had attempted to eat but much of her food was all over her, he was very lethargic and did not speak to me at all though briefly opened her eyes after I did sternal rub and spoke  very loudly to her  -recommended primary team f/u to r/o acute causes (seems this was already started after care plan meeting based on notes)--may need psych med reduction if medical workup unrevealing   Follow up Palliative Care Visit: Palliative care will continue to follow for complex medical decision making, advance care planning, and clarification of goals. Return 6 weeks or prn.   This visit was coded based on medical decision making (MDM).  PPS: 40%  HOSPICE ELIGIBILITY/DIAGNOSIS: TBD  Chief Complaint:  Follow-up palliative visit  HISTORY OF PRESENT ILLNESS:  Becky Hendricks is a 76 y.o. 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  CNAs and nursing note she's been lethargic like she is today for 2-3 days now.  They question if she is not acting this way intentionally.  She aroused to voice and sternal rub but drifted back to sleep quickly.  Did not speak to me at all.    Notes indicate care plan recently held where her brother was also concerned about her lethargy so this is being monitored due to concerns about med doses.    History obtained from review of EMR, discussion with primary team, and interview with family, facility staff/caregiver and/or Becky Hendricks.  I reviewed available labs, medications, imaging, studies and related documents from the EMR.  Records reviewed and summarized above.   ROS--could not really be done with pt not responding  Physical Exam: Current and past weights: 218 lbs 11/27/21 and also 218 in 04/2021 Constitutional: NAD, sleeping upright in bed after lunch with food down her front and on her fingers General:obese, lethargic sitting up in bed, responded only to sternal rub with loud voice EYES: anicteric sclera, lids intact, no discharge  ENMT: intact hearing, oral mucous membranes moist CV: irreg irreg, no LE edema Pulmonary: coarse rhonchi and rales, no increased work of breathing, no cough, on 2L via [place] Abdomen: intake 75-100% typically but had only eaten about 25% today, normo-active BS + 4 quadrants, soft and non tender, no ascites GU: deferred MSK: sarcopenia Skin: warm and dry, no rashes or wounds on visible skin Neuro:  generalized weakness Psych: non-anxious affect Hem/lymph/immuno: no widespread bruising  CURRENT  PROBLEM LIST:  Patient Active Problem List   Diagnosis Date Noted   Acute alteration in mental status    Non compliance with medical treatment    Respiratory failure ([place]) 04/21/2021   OSA treated with BiPAP    Acute and chronic respiratory failure with hypercapnia ([place]) 04/10/2021   Acute on chronic respiratory failure with hypoxia and hypercapnia (HCC)    Acute respiratory failure with hypoxia ([place]) 03/29/2021   Hyponatremia 03/29/2021   Functional quadriplegia ([place]) 03/29/2021   Pneumonia of lower lobe due to infectious organism 03/29/2021   Colitis 03/29/2021   Acute respiratory failure ([place]) 03/29/2021   Bipolar affective disorder, currently manic, mild ([place]) 11/19/2020   Constipation 11/18/2020   Encephalopathy acute 11/18/2020   MRSA bacteremia    SIRS (systemic inflammatory response syndrome) ([place]) 11/17/2020   Nausea and vomiting 11/17/2020   Mixed diabetic hyperlipidemia associated with type 2 diabetes mellitus ([place]) 11/17/2020   Type 2 diabetes mellitus with hyperglycemia, with long-term current use of insulin ([place]) 11/17/2020   Immunization due 07/26/2020   Psychosis ([place])    Bipolar disorder ([place]) 04/27/2020   Fall 02/11/2020  Chronic diastolic CHF (congestive heart failure) ([place]) 01/22/2020   Drug rash    Acute osteomyelitis of pelvic region and thigh [facility])    Palliative care by specialist    Goals of care, counseling/discussion    Edema of upper extremity    Hyperphosphatemia    Cardiac arrest ([place])    Sacral wound    Hypoxia    Severe sepsis ([place]) 12/03/2019   Abscess of multiple sites of buttock 12/03/2019   AF (paroxysmal atrial fibrillation) ([place]) 12/87/8676   Metabolic encephalopathy 72/04/4708   Urinary tract infection without hematuria 09/15/2019   Pressure injury of skin 09/10/2019   Morbid obesity with BMI of 40.0-44.9, adult ([place]) 02/27/2018   PAST MEDICAL HISTORY:  Active Ambulatory Problems    Diagnosis Date Noted   Pressure injury of skin  62/83/6629   Metabolic encephalopathy 47/65/4650   Urinary tract infection without hematuria 09/15/2019   Severe sepsis ([place]) 12/03/2019   Abscess of multiple sites of buttock 12/03/2019   AF (paroxysmal atrial fibrillation) ([place]) 12/03/2019   Hypoxia    Sacral wound    Cardiac arrest ([place])    Hyperphosphatemia    Edema of upper extremity    Palliative care by specialist    Goals of care, counseling/discussion    Drug rash    Acute osteomyelitis of pelvic region and thigh ([place])    Chronic diastolic CHF (congestive heart failure) ([place]) 01/22/2020   Fall 02/11/2020   Morbid obesity with BMI of 40.0-44.9, adult ([place]) 02/27/2018   Bipolar disorder ([place]) 04/27/2020   Psychosis ([place])    Immunization due 07/26/2020   SIRS (systemic inflammatory response syndrome) ([place]) 11/17/2020   Nausea and vomiting 11/17/2020   Mixed diabetic hyperlipidemia associated with type 2 diabetes mellitus ([place]) 11/17/2020   Type 2 diabetes mellitus with hyperglycemia, with long-term current use of insulin ([place]) 11/17/2020   Constipation 11/18/2020   Encephalopathy acute 11/18/2020   MRSA bacteremia    Bipolar affective disorder, currently manic, mild ([place]) 11/19/2020   Acute respiratory failure with hypoxia ([place]) 03/29/2021   Hyponatremia 03/29/2021   Functional quadriplegia ([place]) 03/29/2021   Pneumonia of lower lobe due to infectious organism 03/29/2021   Colitis 03/29/2021   Acute respiratory failure ([place]) 03/29/2021   Acute on chronic respiratory failure with hypoxia and hypercapnia (HCC)    Acute and chronic respiratory failure with hypercapnia ([place]) 04/10/2021   Respiratory failure ([place]) 04/21/2021   OSA treated with BiPAP    Non compliance with medical treatment    Acute alteration in mental status    Resolved Ambulatory Problems    Diagnosis Date Noted   Acute respiratory distress syndrome (ARDS) due to COVID-19 virus ([place]) 09/07/2019   AKI (acute kidney injury) ([place])    Respiratory failure  with hypoxia ([place]) 09/15/2019   Pneumonia due to COVID-19 virus 09/15/2019   Osteomyelitis ([place]) 12/03/2019   Encounter for central line placement    Acute respiratory failure ([place])    Acute respiratory failure with hypoxemia ([place]) 01/19/2020   Acute pulmonary edema (HCC)    Past Medical History:  Diagnosis Date   Atrial fibrillation ([place])    Clostridium difficile diarrhea 08/02/2019   Dehydration    Essential hypertension    Morbid obesity ([place])    Type 2 diabetes mellitus ([place])    Weakness    SOCIAL HX:  Social History   Tobacco Use   Smoking status: Never   Smokeless tobacco: Never  Substance Use Topics   Alcohol use: Not Currently  ALLERGIES:  Allergies  Allergen Reactions   Chlorhexidine Gluconate Itching   Vancomycin     Vancomycin infusion related reaction- May need Vanc to be given at a slower rate    Acetazolamide Er Rash     PERTINENT MEDICATIONS:  Prescription Aldactone (spironolactone) tablet; 25 mg; amt: 1/2 tab; oral Special Instructions: 12.5 mg qd- CHF Once A Day 09:00 AM 07/06/2021- Open Ended Prescription atorvastatin tablet; 10 mg; amt: 1 TAB; oral Special Instructions: GIVE 1 TAB PO Q HS FOR HLD At Bedtime 09:00 PM 05/05/2021- Open Ended Prescription Combivent Respimat (ipratropium-albuterol) mist; 20-100 mcg/actuation; amt: 1 PUFF; inhalation Special Instructions: INHALE 1 PUFF INTO THE LUNGS Q 12 HOURS for resp failure Every 12 Hours 09:00 PM, 09:00 AM 05/05/2021- Open Ended Prescription Coreg (carvedilol) tablet; 3.125 mg; amt: 1 tab; oral Special Instructions: chf At Bedtime 09:00 PM 07/05/2021- Open Ended Prescription divalproex tablet,delayed release (DR/EC); 250 mg; amt: 3 TABS; oral Special Instructions: GIVE 3 TABS = 750 MG PO BID FOR BIPOLAR AFFECTIVE DISORDER Twice A Day 09:00 AM, 09:00 PM 05/05/2021- Open Ended Prescription Eliquis (apixaban) tablet; 5 mg; amt: 5 MG; oral Special Instructions: GIVE 5 MG PO BID  FOR ANTI-COAG Twice A Day 09:00 AM, 09:00 PM 05/05/2021- Open Ended Prescription Eucerin Skin Calming (emollient combination no.69) cream; - ; amt: 1 application; topical Special Instructions: arms & dry skin qd Once A Day 09:00 PM 05/16/2021- Open Ended Prescription Itch Relief (diphenhydramine) (diphenhydramine hcl) [OTC] gel; 2 %; amt: 1 GRAM; topical Special Instructions: APPLY TOPICALLY TID PRN TO AFFECTED AREA FOR ITCHING Three Times A Day - PRN PRN 1, PRN 2, PRN 3 05/05/2021- Open Ended Prescription Jardiance (empagliflozin) tablet; 10 mg; amt: 1 tab; oral Special Instructions: dm Once A Day 09:00 AM 05/06/2021- Open Ended Prescription lidocaine [OTC] cream; 4 %; amt: general amt; topical Special Instructions: apply to gauze and affix over R posterior auricular area (behind the ear) under cannula tubing for cushion/pain Once A Day 09:00 AM 08/25/2021- Open Ended Prescription Risperdal (risperidone) tablet; 3 mg; amt: 1 TAB; oral Special Instructions: TAKE 1 TAB PO BID - SCHIZOAFFECTIVE DISORDER Twice A Day 09:00 AM, 09:00 PM 09/11/2021- Open Ended Prescription Senna-S (sennosides-docusate sodium) [OTC] tablet; 8.6-50 mg; amt: 2 tabs; oral Special Instructions: bowel aid Once A Day 09:00 AM 05/09/2021- Open Ended Prescription tamsulosin capsule; 0.4 mg; amt: 1 CAPSULE; oral Special Instructions: GIVE 1 CAPSULE PO EVERY OTHER DAY FOR URINE RETENTION Once A Day Every Other Day 09:00 AM 05/06/2021- Open Ended Prescription Tums (calcium carbonate) [OTC] tablet,chewable; 200 mg calcium (500 mg); amt: 1 TUMS; oral Special Instructions: CHEW 1 TUMS DAILY FOR SUPPLEMENT Once A Day 09:00 AM  Thank you for the opportunity to participate in the care of Ms. Rosado.  The palliative care team will continue to follow. Please call our office at [phone] if we can be of additional assistance.   Hollace Kinnier, DO  COVID-19 PATIENT SCREENING TOOL Asked and negative  response unless otherwise noted:  Have you had symptoms of covid, tested positive or been in contact with someone with symptoms/positive test in the past 5-10 days? no

## 2021-12-09 ENCOUNTER — Other Ambulatory Visit: Payer: Self-pay

## 2021-12-09 ENCOUNTER — Emergency Department (HOSPITAL_COMMUNITY)
Admission: EM | Admit: 2021-12-09 | Discharge: 2021-12-10 | Disposition: A | Discharge status: Nursing Home (Includes ICF) | Payer: Medicare Other | Attending: Emergency Medicine | Admitting: Emergency Medicine

## 2021-12-09 ENCOUNTER — Encounter (HOSPITAL_COMMUNITY): Payer: Self-pay | Admitting: Emergency Medicine

## 2021-12-09 ENCOUNTER — Emergency Department (HOSPITAL_COMMUNITY): Payer: Medicare Other

## 2021-12-09 DIAGNOSIS — R5383 Other fatigue: Secondary | ICD-10-CM | POA: Diagnosis present

## 2021-12-09 DIAGNOSIS — R531 Weakness: Secondary | ICD-10-CM | POA: Diagnosis not present

## 2021-12-09 DIAGNOSIS — E119 Type 2 diabetes mellitus without complications: Secondary | ICD-10-CM | POA: Diagnosis not present

## 2021-12-09 DIAGNOSIS — J9622 Acute and chronic respiratory failure with hypercapnia: Secondary | ICD-10-CM | POA: Insufficient documentation

## 2021-12-09 DIAGNOSIS — I110 Hypertensive heart disease with heart failure: Secondary | ICD-10-CM | POA: Diagnosis not present

## 2021-12-09 DIAGNOSIS — I509 Heart failure, unspecified: Secondary | ICD-10-CM | POA: Insufficient documentation

## 2021-12-09 DIAGNOSIS — Z794 Long term (current) use of insulin: Secondary | ICD-10-CM | POA: Insufficient documentation

## 2021-12-09 DIAGNOSIS — Z7901 Long term (current) use of anticoagulants: Secondary | ICD-10-CM | POA: Diagnosis not present

## 2021-12-09 LAB — CBC WITH DIFFERENTIAL/PLATELET
Abs Immature Granulocytes: 0.17 10*3/uL — ABNORMAL HIGH (ref 0.00–0.07)
Basophils Absolute: 0.1 10*3/uL (ref 0.0–0.1)
Basophils Relative: 1 %
Eosinophils Absolute: 0.2 10*3/uL (ref 0.0–0.5)
Eosinophils Relative: 2 %
HCT: 43.1 % (ref 36.0–46.0)
Hemoglobin: 13.2 g/dL (ref 12.0–15.0)
Immature Granulocytes: 3 %
Lymphocytes Relative: 39 %
Lymphs Abs: 2.6 10*3/uL (ref 0.7–4.0)
MCH: 31.9 pg (ref 26.0–34.0)
MCHC: 30.6 g/dL (ref 30.0–36.0)
MCV: 104.1 fL — ABNORMAL HIGH (ref 80.0–100.0)
Monocytes Absolute: 0.9 10*3/uL (ref 0.1–1.0)
Monocytes Relative: 13 %
Neutro Abs: 2.9 10*3/uL (ref 1.7–7.7)
Neutrophils Relative %: 42 %
Platelets: 157 10*3/uL (ref 150–400)
RBC: 4.14 MIL/uL (ref 3.87–5.11)
RDW: 14.9 % (ref 11.5–15.5)
WBC: 6.8 10*3/uL (ref 4.0–10.5)
nRBC: 0.3 % — ABNORMAL HIGH (ref 0.0–0.2)

## 2021-12-09 LAB — URINALYSIS, ROUTINE W REFLEX MICROSCOPIC
Bacteria, UA: NONE SEEN
Bilirubin Urine: NEGATIVE
Glucose, UA: >=500 mg/dL — AB
Hgb urine dipstick: NEGATIVE
Ketones, ur: 20 mg/dL — AB
Leukocytes,Ua: NEGATIVE
Nitrite: NEGATIVE
Protein, ur: NEGATIVE mg/dL
Specific Gravity, Urine: 1.033 — ABNORMAL HIGH (ref 1.005–1.030)
pH: 5 (ref 5.0–8.0)

## 2021-12-09 LAB — COMPREHENSIVE METABOLIC PANEL
ALT: 13 U/L (ref 0–44)
AST: 11 U/L — ABNORMAL LOW (ref 15–41)
Albumin: 3 g/dL — ABNORMAL LOW (ref 3.5–5.0)
Alkaline Phosphatase: 31 U/L — ABNORMAL LOW (ref 38–126)
Anion gap: 7 (ref 5–15)
BUN: 20 mg/dL (ref 8–23)
CO2: 35 mmol/L — ABNORMAL HIGH (ref 22–32)
Calcium: 8.7 mg/dL — ABNORMAL LOW (ref 8.9–10.3)
Chloride: 100 mmol/L (ref 98–111)
Creatinine, Ser: 0.58 mg/dL (ref 0.44–1.00)
GFR, Estimated: >60 mL/min (ref >60)
Glucose, Bld: 158 mg/dL — ABNORMAL HIGH (ref 70–99)
Potassium: 4.5 mmol/L (ref 3.5–5.1)
Sodium: 142 mmol/L (ref 135–145)
Total Bilirubin: 0.7 mg/dL (ref 0.3–1.2)
Total Protein: 6.1 g/dL — ABNORMAL LOW (ref 6.5–8.1)

## 2021-12-09 LAB — MAGNESIUM: Magnesium: 2.3 mg/dL (ref 1.7–2.4)

## 2021-12-09 LAB — TROPONIN I (HIGH SENSITIVITY): Troponin I (High Sensitivity): 8 ng/L (ref <18)

## 2021-12-09 LAB — LACTIC ACID, PLASMA: Lactic Acid, Venous: 0.8 mmol/L (ref 0.5–1.9)

## 2021-12-09 IMAGING — DX DG CHEST 1V PORT
1 series · 1 of 1 positions shown · non-contrast
Comparison: [DATE]

CLINICAL DATA: Possible sepsis

EXAM:
PORTABLE CHEST 1 VIEW

[chest ap]
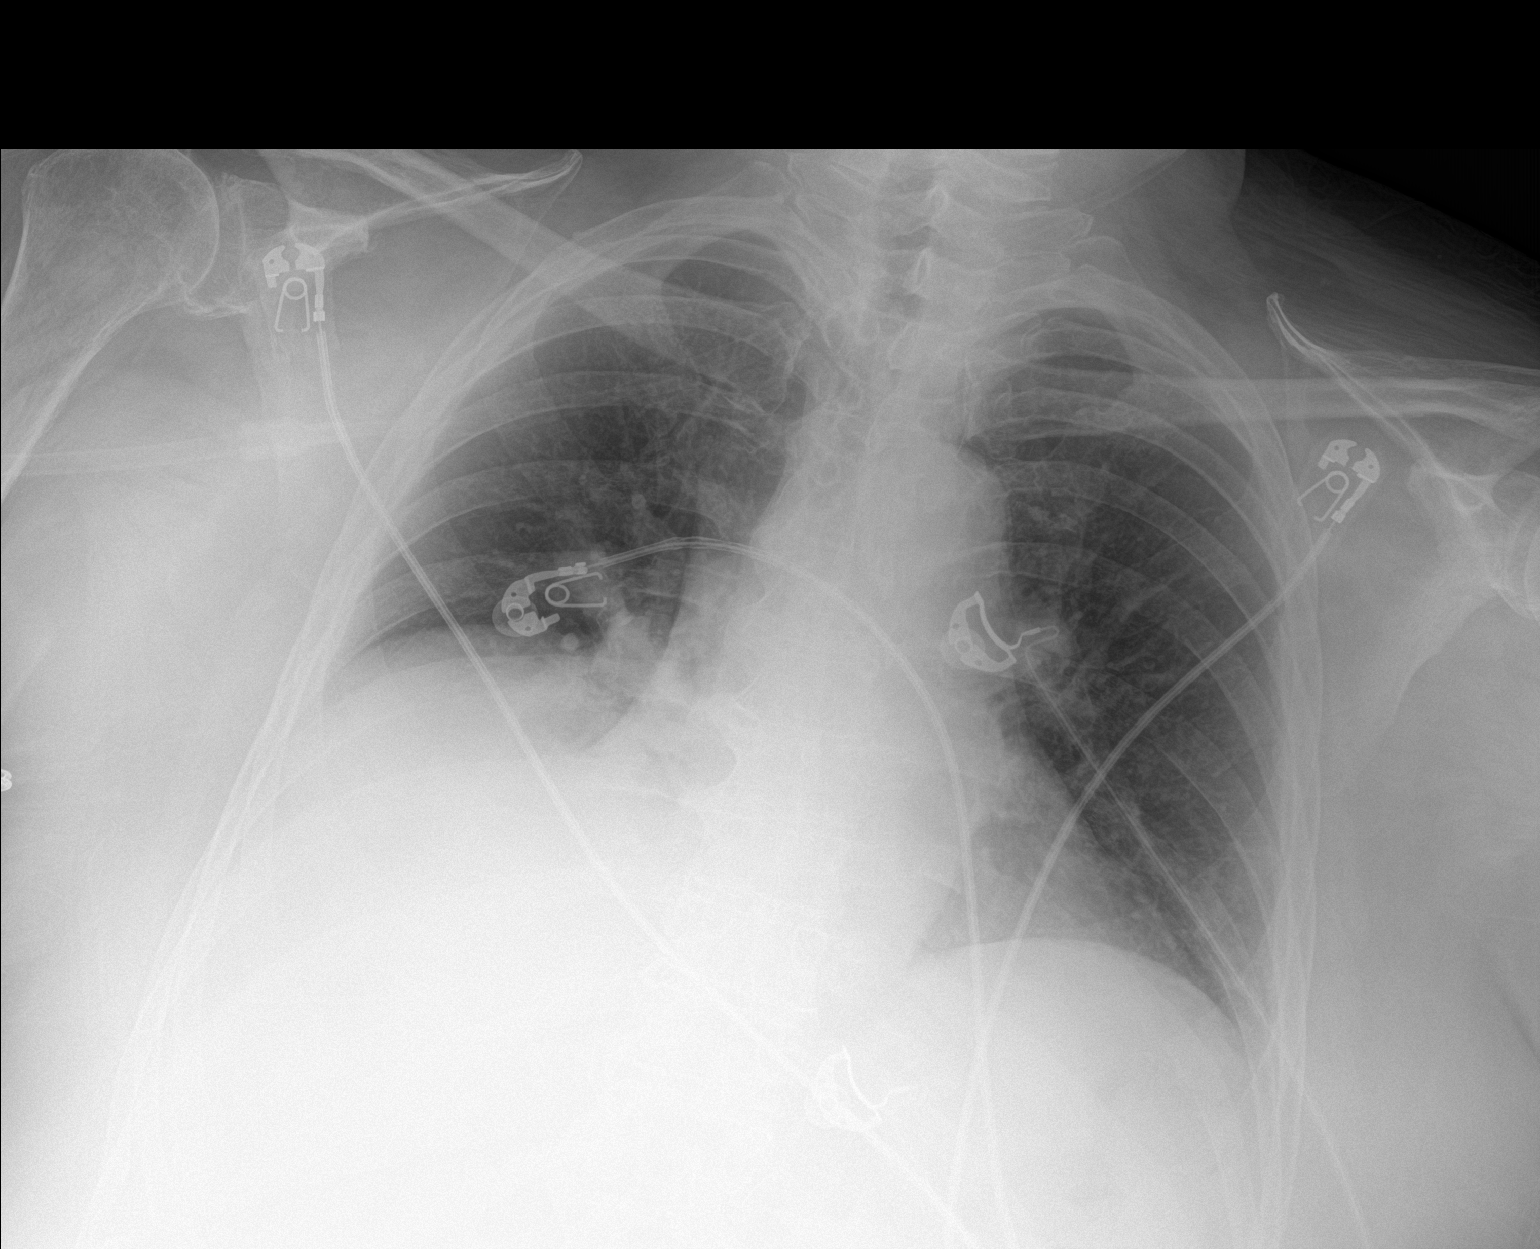

[1 of 1 positions shown; findings below may reference images not displayed]

FINDINGS: Cardiac shadow is stable. Left lung is clear. Elevation of the right
hemidiaphragm is again noted with mild right basilar atelectasis. No
bony abnormality is noted.
IMPRESSION: Mild right basilar atelectasis.

## 2021-12-09 NOTE — ED Triage Notes (Signed)
Patient reports generalized weakness since august. Staff report weakness began today.  ? ? ?EMS vitals: ?146/88 BP ?95% SPO2 on 4L O2 nasal canula ?70 HR ?92 CBG ?

## 2021-12-09 NOTE — ED Provider Notes (Signed)
?[facility]-EMERGENCY DEPT ?Provider Note ? ? ?CSN: [id number] ?Arrival date & time: 12/09/21  1844 ? ?  ? ?History ? ?Chief Complaint  ?Patient presents with  ? Fatigue  ? ? ?Becky Hendricks is a 76 y.o. female. ? ?HPI ?Patient presents for fatigue and generalized weakness.  This is based on ED triage note.  Medical history includes CHF, osteomyelitis, obesity, bipolar disorder, DM, quadriplegia, sacral wound, OSA, HTN, atrial fibrillation.  She was seen by palliative care 10 days ago.  At that time, she was noted to be very drowsy and lethargic.  She wears BiPAP at night and O2 during the day.  Patient is unaware of why she was sent to the ED.  She denies any current areas of discomfort.  Calls to her nursing facility went unanswered. ?  ? ?Home Medications ?Prior to Admission medications   ?Medication Sig Start Date End Date Taking? Authorizing Provider  ?apixaban (ELIQUIS) 5 MG TABS tablet Take 5 mg by mouth 2 (two) times daily.   Yes [provider]  ?atorvastatin (LIPITOR) 10 MG tablet Take 10 mg by mouth at bedtime.   Yes [provider]  ?calcium carbonate (TUMS - DOSED IN MG ELEMENTAL CALCIUM) 500 MG chewable tablet Chew 500 mg by mouth daily.   Yes [provider]  ?carvedilol (COREG) 3.125 MG tablet Take 3.125 mg by mouth at bedtime. 11/13/21  Yes [provider]  ?diphenhydrAMINE (BENADRYL) 2 % cream Apply 1 application topically 3 (three) times daily as needed for itching.   Yes [provider]  ?divalproex (DEPAKOTE) 250 MG DR tablet Take 3 tablets (750 mg total) by mouth 2 (two) times daily. ?Patient taking differently: Take 500 mg by mouth 2 (two) times daily. 04/19/21  Yes Esaw Grandchild A, DO  ?empagliflozin (JARDIANCE) 10 MG TABS tablet Take 10 mg by mouth daily.   Yes [provider]  ?Ipratropium-Albuterol (COMBIVENT RESPIMAT) 20-100 MCG/ACT AERS respimat Inhale 1 puff into the lungs every 12 (twelve) hours.   Yes [provider]  ?lidocaine (LMX) 4 % cream Apply 1 application. topically daily. Apply to guaze and place behind the ear under cannula tubing for cushion or pain   Yes [provider]  ?PARoxetine (PAXIL) 10 MG tablet Take 5 mg by mouth daily. 12/04/21  Yes [provider]  ?risperiDONE (RISPERDAL) 3 MG tablet Take 3 mg by mouth 2 (two) times daily. 11/09/21  Yes [provider]  ?sennosides-docusate sodium (SENOKOT-S) 8.6-50 MG tablet Take 2 tablets by mouth daily. Hold if having loose or frequent stools 04/19/21  Yes Pennie Banter, DO  ?Skin Protectants, Misc. (EUCERIN) cream Apply 1 application. topically daily. Apply to arms and dry skin   Yes [provider]  ?spironolactone (ALDACTONE) 25 MG tablet Take 12.5 mg by mouth daily. 11/27/21  Yes [provider]  ?tamsulosin (FLOMAX) 0.4 MG CAPS capsule Take 1 capsule (0.4 mg total) by mouth every other day. 04/20/21  Yes Esaw Grandchild A, DO  ?furosemide (LASIX) 20 MG tablet Take 1 tablet (20 mg total) by mouth daily. ?Patient not taking: Reported on 12/10/2021 05/06/21   Marinda Elk, MD  ?insulin lispro (HUMALOG) 100 UNIT/ML injection Inject 2-12 Units into the skin in the morning and at bedtime. CBG 70 - 200: 0 units ?CBG 201 - 250: 2 units ?CBG 251 - 300: 4 units ?CBG 301 - 350: 6 units ?CBG 351 - 400: 8 units ?CBG 401 - 450: 10 units ?CBG 451 -  600: 12 units ?If CBG is > 450, give 12 units, recheck in 2 hours. If still >350, notify provider ?Patient not taking: Reported on 12/10/2021    [provider]  ?   ? ?Allergies    ?Chlorhexidine gluconate, Vancomycin, and Acetazolamide er   ? ?Review of Systems   ?Review of Systems  ?Neurological:  Positive for weakness (Generalized).  ?All other systems reviewed and are negative. ? ?Physical Exam ?Updated Vital Signs ?BP (!) 129/56   Pulse 80   Temp 97.9 ?F (36.6 ?C) (Oral)   Resp 17   LMP  (LMP Unknown)   SpO2 99%  ?Physical Exam ?Vitals and nursing note  reviewed.  ?Constitutional:   ?   General: She is not in acute distress. ?   Appearance: She is well-developed. She is ill-appearing (Chronically). She is not toxic-appearing or diaphoretic.  ?HENT:  ?   Head: Normocephalic and atraumatic.  ?   Right Ear: External ear normal.  ?   Left Ear: External ear normal.  ?   Nose: Nose normal.  ?   Mouth/Throat:  ?   Mouth: Mucous membranes are moist.  ?   Pharynx: Oropharynx is clear.  ?Eyes:  ?   Extraocular Movements: Extraocular movements intact.  ?   Conjunctiva/sclera: Conjunctivae normal.  ?Cardiovascular:  ?   Rate and Rhythm: Normal rate and regular rhythm.  ?   Heart sounds: No murmur heard. ?Pulmonary:  ?   Effort: Pulmonary effort is normal. No respiratory distress.  ?   Breath sounds: Normal breath sounds. No wheezing, rhonchi or rales.  ?Abdominal:  ?   Palpations: Abdomen is soft.  ?   Tenderness: There is no abdominal tenderness.  ?Musculoskeletal:     ?   General: No swelling or tenderness.  ?   Cervical back: Normal range of motion and neck supple.  ?Skin: ?   General: Skin is warm and dry.  ?   Coloration: Skin is not jaundiced or pale.  ?Neurological:  ?   General: No focal deficit present.  ?   Mental Status: She is alert. Mental status is at baseline.  ?Psychiatric:     ?   Mood and Affect: Mood normal.     ?   Behavior: Behavior normal.  ? ? ?ED Results / Procedures / Treatments   ?Labs ?(all labs ordered are listed, but only abnormal results are displayed) ?Labs Reviewed  ?COMPREHENSIVE METABOLIC PANEL - Abnormal; Notable for the following components:  ?    Result Value  ? CO2 35 (*)   ? Glucose, Bld 158 (*)   ? Calcium 8.7 (*)   ? Total Protein 6.1 (*)   ? Albumin 3.0 (*)   ? AST 11 (*)   ? Alkaline Phosphatase 31 (*)   ? All other components within normal limits  ?CBC WITH DIFFERENTIAL/PLATELET - Abnormal; Notable for the following components:  ? MCV 104.1 (*)   ? nRBC 0.3 (*)   ? Abs Immature Granulocytes 0.17 (*)   ? All other components within  normal limits  ?URINALYSIS, ROUTINE W REFLEX MICROSCOPIC - Abnormal; Notable for the following components:  ? Specific Gravity, Urine 1.033 (*)   ? Glucose, UA >=500 (*)   ? Ketones, ur 20 (*)   ? All other components within normal limits  ?BLOOD GAS, VENOUS - Abnormal; Notable for the following components:  ? pCO2, Ven 86 (*)   ? Bicarbonate 41.4 (*)   ? Acid-Base Excess 11.0 (*)   ?  All other components within normal limits  ?BLOOD GAS, VENOUS - Abnormal; Notable for the following components:  ? pCO2, Ven 86 (*)   ? pO2, Ven <31 (*)   ? Bicarbonate 39.5 (*)   ? Acid-Base Excess 9.0 (*)   ? All other components within normal limits  ?BLOOD GAS, ARTERIAL - Abnormal; Notable for the following components:  ? pH, Arterial 7.31 (*)   ? pCO2 arterial 76 (*)   ? Bicarbonate 38.3 (*)   ? Acid-Base Excess 9.2 (*)   ? All other components within normal limits  ?BLOOD GAS, ARTERIAL - Abnormal; Notable for the following components:  ? pCO2 arterial 69 (*)   ? Bicarbonate 37.0 (*)   ? Acid-Base Excess 9.0 (*)   ? All other components within normal limits  ?LACTIC ACID, PLASMA  ?LACTIC ACID, PLASMA  ?MAGNESIUM  ?TROPONIN I (HIGH SENSITIVITY)  ?TROPONIN I (HIGH SENSITIVITY)  ? ? ?EKG ?EKG Interpretation ? ?Date/Time:  Saturday December 09 2021 19:39:49 EDT ?Ventricular Rate:  74 ?PR Interval:    ?QRS Duration: 82 ?QT Interval:  351 ?QTC Calculation: 390 ?R Axis:   50 ?Text Interpretation: Atrial flutter with predominant 4:1 AV block Nonspecific T abnormalities, lateral leads Confirmed by Gloris Manchesterixon, Idus Rathke [phone]) on 12/09/2021 11:22:35 PM ? ?Radiology ?DG Chest Port 1 View ? ?Result Date: 12/09/2021 ?CLINICAL DATA:  Possible sepsis EXAM: PORTABLE CHEST 1 VIEW COMPARISON:  04/28/2021 FINDINGS: Cardiac shadow is stable. Left lung is clear. Elevation of the right hemidiaphragm is again noted with mild right basilar atelectasis. No bony abnormality is noted. IMPRESSION: Mild right basilar atelectasis. Electronically Signed   By: Alcide CleverMark  Lukens M.D.    On: 12/09/2021 19:53   ? ?Procedures ?Procedures  ? ? ?Medications Ordered in ED ?Medications - No data to display ? ?ED Course/ Medical Decision Making/ A&P ?  ?                        ?Medical Decision Evangeline DakinMak

## 2021-12-09 NOTE — ED Notes (Signed)
Labeled urine specimen and culture sent to lab. ENMiles 

## 2021-12-09 NOTE — ED Notes (Signed)
Patient now reports weakness for only a couple of days. ? ?

## 2021-12-10 LAB — BLOOD GAS, VENOUS
Acid-Base Excess: 11 mmol/L — ABNORMAL HIGH (ref 0.0–2.0)
Acid-Base Excess: 9 mmol/L — ABNORMAL HIGH (ref 0.0–2.0)
Bicarbonate: 39.5 mmol/L — ABNORMAL HIGH (ref 20.0–28.0)
Bicarbonate: 41.4 mmol/L — ABNORMAL HIGH (ref 20.0–28.0)
O2 Saturation: 51 %
O2 Saturation: 73.3 %
Patient temperature: 37
Patient temperature: 37
pCO2, Ven: 86 mmHg (ref 44–60)
pCO2, Ven: 86 mmHg (ref 44–60)
pH, Ven: 7.27 (ref 7.25–7.43)
pH, Ven: 7.29 (ref 7.25–7.43)
pO2, Ven: 41 mmHg (ref 32–45)
pO2, Ven: <31 mmHg — CL (ref 32–45)

## 2021-12-10 LAB — BLOOD GAS, ARTERIAL
Acid-Base Excess: 9.2 mmol/L — ABNORMAL HIGH (ref 0.0–2.0)
Acid-Base Excess: 9 mmol/L — ABNORMAL HIGH (ref 0.0–2.0)
Bicarbonate: 38.3 mmol/L — ABNORMAL HIGH (ref 20.0–28.0)
Bicarbonate: 37 mmol/L — ABNORMAL HIGH (ref 20.0–28.0)
Delivery systems: POSITIVE
Drawn by: 11249
FIO2: 40 %
O2 Saturation: 99.9 %
O2 Saturation: 97.9 %
Patient temperature: 37
Patient temperature: 37
pCO2 arterial: 76 mmHg (ref 32–48)
pCO2 arterial: 69 mmHg (ref 32–48)
pH, Arterial: 7.31 — ABNORMAL LOW (ref 7.35–7.45)
pH, Arterial: 7.35 (ref 7.35–7.45)
pO2, Arterial: 104 mmHg (ref 83–108)
pO2, Arterial: 96 mmHg (ref 83–108)

## 2021-12-10 LAB — LACTIC ACID, PLASMA: Lactic Acid, Venous: 0.9 mmol/L (ref 0.5–1.9)

## 2021-12-10 LAB — TROPONIN I (HIGH SENSITIVITY): Troponin I (High Sensitivity): 8 ng/L (ref <18)

## 2021-12-10 MED ORDER — APIXABAN 5 MG PO TABS: 5.0000 mg | ORAL_TABLET | Freq: Two times a day (BID) | ORAL | Status: DC

## 2021-12-10 MED ORDER — CARVEDILOL 3.125 MG PO TABS: 3.1250 mg | ORAL_TABLET | Freq: Two times a day (BID) | ORAL | Status: DC

## 2021-12-10 MED ORDER — DIVALPROEX SODIUM 500 MG PO DR TAB: 500.0000 mg | DELAYED_RELEASE_TABLET | Freq: Two times a day (BID) | ORAL | Status: DC

## 2021-12-10 MED ORDER — ATORVASTATIN CALCIUM 10 MG PO TABS: 10.0000 mg | ORAL_TABLET | Freq: Every day | ORAL | Status: DC

## 2021-12-10 NOTE — Progress Notes (Signed)
Patient placed on BIPAP. Patient tolerating well ?

## 2021-12-10 NOTE — ED Provider Notes (Signed)
Signed out to me on BiPAP.  Patient had been presented to hospitalist service for possible admission and they recommended continuing BiPAP and rechecking a blood gas.  Patient does have chronic respiratory failure and hypercarbia.  Recheck of her blood gas shows improvement of her CO2 to approximately her baseline, still slightly acidotic. ? ?Patient maintained on BiPAP for 1 and half hours further.  She is now completely compensated with a pH of 7.35 and PCO2 of 69.  She is off of BiPAP and tolerating.  Will discharge back to nursing facility. ?  ?Gilda Crease, MD ?12/10/21 [phone] ? ?

## 2022-03-07 ENCOUNTER — Encounter: Payer: Self-pay | Admitting: Internal Medicine

## 2022-03-07 ENCOUNTER — Non-Acute Institutional Stay: Payer: Medicare Other | Admitting: Internal Medicine

## 2022-03-07 VITALS — BP 137/72 | HR 66 | Temp 97.9°F | Resp 18 | Ht 66.0 in | Wt 209.2 lb

## 2022-03-07 DIAGNOSIS — Z515 Encounter for palliative care: Secondary | ICD-10-CM

## 2022-03-07 DIAGNOSIS — I5032 Chronic diastolic (congestive) heart failure: Secondary | ICD-10-CM

## 2022-03-07 DIAGNOSIS — J9611 Chronic respiratory failure with hypoxia: Secondary | ICD-10-CM

## 2022-03-07 NOTE — Progress Notes (Signed)
[facility] Palliative Care Follow-Up Visit Telephone: [phone]  Fax: [phone]   Date of encounter: 03/07/22 6:43 AM PATIENT NAME: Becky Hendricks [address] [place] [address]   [phone] (home)  DOB: 11-Aug-1946 MRN: [id number] PRIMARY CARE PROVIDER:    [name], MD,  [place] [address] [phone]  REFERRING PROVIDER:   Jules Husbands, MD/Lee Wynetta Emery, NP  RESPONSIBLE PARTY:    Contact Information     Name Relation Home Work Mobile   Ariannie, Penaloza DJMEQAS [phone]          I met face to face with patient and family in [facility] facility. Palliative Care was asked to follow this patient by consultation request of  Dr. Otilio Carpen, NP to address advance care planning and complex medical decision making. This is follow-up visit.                                     ASSESSMENT AND PLAN / RECOMMENDATIONS:   Advance Care Planning/Goals of Care: Goals include to maximize quality of life and symptom management. Patient/health care surrogate gave his/her permission to discuss.Our advance care planning conversation included a discussion about:    The value and importance of advance care planning  Experiences with loved ones who have been seriously ill or have died  Exploration of personal, cultural or spiritual beliefs that might influence medical decisions  Exploration of goals of care in the event of a sudden injury or illness  Identification  of a healthcare agent  Review and updating or creation of an  advance directive document . Decision not to resuscitate or to de-escalate disease focused treatments due to poor prognosis. CODE STATUS:  full code, full scope, abx IVF and feeding tube if indicated even long-term as she has requested  Symptom Management/Plan: 1. Chronic respiratory failure with hypoxia and hypercapnia (HCC) -continues to require oxygen  therapy -she's getting around and attending activities again, not as confused as last visit a couple of months ago  2. Chronic diastolic CHF (congestive heart failure) (HCC) -appears euvolemic and actually down 9 lbs since April, cont current regimen  3. Palliative care by specialist -goals remain aggressive care per pt request and her brother supports her wishes    Follow up Palliative Care Visit: Palliative care will continue to follow for complex medical decision making, advance care planning, and clarification of goals. Return 12 weeks or prn.   This visit was coded based on medical decision making (MDM).  PPS: 40%  HOSPICE ELIGIBILITY/DIAGNOSIS: TBD  Chief Complaint: Follow-up palliative visit  HISTORY OF PRESENT ILLNESS:  Becky Hendricks is a 76 y.o. year old female  with chf, respiratory failure DMII, recurrent admissions, functional quadriplegia, afib, and bipolar disorder among others seen in palliative f/u.   Per nursing notes:  Res is A&O x3 and pleasantly confused at times. Res is able to verbalize wants and needs to staff. Res is currently resting in bed and denies any c/o pain or distress on this shift. Res takes medication whole with no issues. Res is incontinent of B&B but is able to verbalize when care is needed. Res has scheduled CBG checks. Res needs total assistance with ADL'S and xfers. Res VSS and WNL. Res is on continuous O2 at 2.5 L. Call bell within reach. Will continue to monitor throughout shift.  History obtained from  review of EMR, discussion with primary team, and interview with family, facility staff/caregiver and/or Ms. Brundidge.  I reviewed available labs, medications, imaging, studies and related documents from the EMR.  Records reviewed and summarized above.   ROS Review of Systems  Constitutional:  Positive for activity change and fatigue. Negative for appetite change, chills and fever.  Eyes:  Negative for visual disturbance.  Respiratory:  Positive  for shortness of breath.   Cardiovascular:  Negative for chest pain and leg swelling.  Gastrointestinal:  Negative for abdominal pain.  Genitourinary:  Negative for dysuria.  Musculoskeletal:  Positive for gait problem.  Skin:  Positive for pallor.  Neurological:  Positive for weakness.  Psychiatric/Behavioral:  Positive for confusion.     Physical Exam: Vitals:   03/07/22 0642  BP: 137/72  Pulse: 66  Resp: 18  Temp: 97.9 F (36.6 C)  SpO2: 95%  Weight: 209 lb 3.2 oz (94.9 kg)  Height: '5\' 6"'  (1.676 m)   Body mass index is 33.77 kg/m. Wt Readings from Last 500 Encounters:  03/07/22 209 lb 3.2 oz (94.9 kg)  11/29/21 218 lb 3.2 oz (99 kg)  05/05/21 218 lb 11.1 oz (99.2 kg)  04/19/21 233 lb 7.5 oz (105.9 kg)  04/07/21 237 lb 7 oz (107.7 kg)  12/20/20 194 lb (88 kg)  11/20/20 194 lb 0.1 oz (88 kg)  10/12/20 195 lb 3.2 oz (88.5 kg)  05/06/20 203 lb 14.8 oz (92.5 kg)  04/27/20 204 lb (92.5 kg)  01/27/20 204 lb 2.3 oz (92.6 kg)  01/06/20 158 lb (71.7 kg)  12/10/19 242 lb 8.1 oz (110 kg)  09/20/19 244 lb 4.3 oz (110.8 kg)  09/07/19 220 lb 7.4 oz (100 kg)   Physical Exam Constitutional:      Appearance: Normal appearance.  Cardiovascular:     Rate and Rhythm: Normal rate and regular rhythm.  Pulmonary:     Effort: Pulmonary effort is normal.     Breath sounds: Normal breath sounds. No rales.  Abdominal:     General: Bowel sounds are normal.  Musculoskeletal:        General: Normal range of motion.  Neurological:     General: No focal deficit present.     Mental Status: She is alert. Mental status is at baseline.  Psychiatric:        Mood and Affect: Mood normal.     CURRENT PROBLEM LIST:  Patient Active Problem List   Diagnosis Date Noted   Acute alteration in mental status    Non compliance with medical treatment    Respiratory failure ([place]) 04/21/2021   OSA treated with BiPAP    Acute and chronic respiratory failure with hypercapnia ([place]) 04/10/2021    Acute on chronic respiratory failure with hypoxia and hypercapnia (HCC)    Acute respiratory failure with hypoxia ([place]) 03/29/2021   Hyponatremia 03/29/2021   Functional quadriplegia ([place]) 03/29/2021   Pneumonia of lower lobe due to infectious organism 03/29/2021   Colitis 03/29/2021   Acute respiratory failure ([place]) 03/29/2021   Bipolar affective disorder, currently manic, mild ([place]) 11/19/2020   Constipation 11/18/2020   Encephalopathy acute 11/18/2020   MRSA bacteremia    SIRS (systemic inflammatory response syndrome) ([place]) 11/17/2020   Nausea and vomiting 11/17/2020   Mixed diabetic hyperlipidemia associated with type 2 diabetes mellitus ([place]) 11/17/2020   Type 2 diabetes mellitus with hyperglycemia, with long-term current use of insulin ([place]) 11/17/2020   Immunization due 07/26/2020   Psychosis ([place])    Bipolar  disorder ([place]) 04/27/2020   Fall 02/11/2020   Chronic diastolic CHF (congestive heart failure) ([place]) 01/22/2020   Drug rash    Acute osteomyelitis of pelvic region and thigh [facility])    Palliative care by specialist    Goals of care, counseling/discussion    Edema of upper extremity    Hyperphosphatemia    Cardiac arrest ([place])    Sacral wound    Hypoxia    Severe sepsis ([place]) 12/03/2019   Abscess of multiple sites of buttock 12/03/2019   AF (paroxysmal atrial fibrillation) ([place]) 60/45/4098   Metabolic encephalopathy 11/91/4782   Urinary tract infection without hematuria 09/15/2019   Pressure injury of skin 09/10/2019   Morbid obesity with BMI of 40.0-44.9, adult ([place]) 02/27/2018    PAST MEDICAL HISTORY:  Active Ambulatory Problems    Diagnosis Date Noted   Pressure injury of skin 95/62/1308   Metabolic encephalopathy 65/78/4696   Urinary tract infection without hematuria 09/15/2019   Severe sepsis ([place]) 12/03/2019   Abscess of multiple sites of buttock 12/03/2019   AF (paroxysmal atrial fibrillation) ([place]) 12/03/2019   Hypoxia    Sacral wound    Cardiac  arrest ([place])    Hyperphosphatemia    Edema of upper extremity    Palliative care by specialist    Goals of care, counseling/discussion    Drug rash    Acute osteomyelitis of pelvic region and thigh ([place])    Chronic diastolic CHF (congestive heart failure) ([place]) 01/22/2020   Fall 02/11/2020   Morbid obesity with BMI of 40.0-44.9, adult ([place]) 02/27/2018   Bipolar disorder ([place]) 04/27/2020   Psychosis ([place])    Immunization due 07/26/2020   SIRS (systemic inflammatory response syndrome) ([place]) 11/17/2020   Nausea and vomiting 11/17/2020   Mixed diabetic hyperlipidemia associated with type 2 diabetes mellitus ([place]) 11/17/2020   Type 2 diabetes mellitus with hyperglycemia, with long-term current use of insulin ([place]) 11/17/2020   Constipation 11/18/2020   Encephalopathy acute 11/18/2020   MRSA bacteremia    Bipolar affective disorder, currently manic, mild ([place]) 11/19/2020   Acute respiratory failure with hypoxia ([place]) 03/29/2021   Hyponatremia 03/29/2021   Functional quadriplegia ([place]) 03/29/2021   Pneumonia of lower lobe due to infectious organism 03/29/2021   Colitis 03/29/2021   Acute respiratory failure ([place]) 03/29/2021   Acute on chronic respiratory failure with hypoxia and hypercapnia (HCC)    Acute and chronic respiratory failure with hypercapnia ([place]) 04/10/2021   Respiratory failure ([place]) 04/21/2021   OSA treated with BiPAP    Non compliance with medical treatment    Acute alteration in mental status    Resolved Ambulatory Problems    Diagnosis Date Noted   Acute respiratory distress syndrome (ARDS) due to COVID-19 virus ([place]) 09/07/2019   AKI (acute kidney injury) ([place])    Respiratory failure with hypoxia ([place]) 09/15/2019   Pneumonia due to COVID-19 virus 09/15/2019   Osteomyelitis ([place]) 12/03/2019   Encounter for central line placement    Acute respiratory failure ([place])    Acute respiratory failure with hypoxemia ([place]) 01/19/2020   Acute pulmonary edema (HCC)    Past  Medical History:  Diagnosis Date   Atrial fibrillation ([place])    Clostridium difficile diarrhea 08/02/2019   Dehydration    Essential hypertension    Morbid obesity ([place])    Type 2 diabetes mellitus ([place])    Weakness     SOCIAL HX:  Social History   Tobacco Use   Smoking status: Never   Smokeless tobacco: Never  Substance Use Topics   Alcohol use: Not Currently     ALLERGIES:  Allergies  Allergen Reactions   Chlorhexidine Gluconate Itching   Vancomycin     Vancomycin infusion related reaction- May need Vanc to be given at a slower rate    Acetazolamide Er Rash      PERTINENT MEDICATIONS:  Prescription Aldactone (spironolactone) tablet; 25 mg; amt: 1/2 tab; oral Special Instructions: 12.5 mg qd- CHF Once A Day 09:00 AM 07/06/2021- Open Ended Prescription atorvastatin tablet; 10 mg; amt: 1 TAB; oral Special Instructions: GIVE 1 TAB PO Q HS FOR HLD At Bedtime 09:00 PM 05/05/2021- Open Ended Prescription Combivent Respimat (ipratropium-albuterol) mist; 20-100 mcg/actuation; amt: 1 PUFF; inhalation Special Instructions: INHALE 1 PUFF INTO THE LUNGS Q 12 HOURS for resp failure Every 12 Hours 09:00 PM, 09:00 AM 05/05/2021- Open Ended Prescription Coreg (carvedilol) tablet; 3.125 mg; amt: 1 tab; oral Special Instructions: chf At Bedtime 09:00 PM 07/05/2021- Open Ended Prescription Depakote (divalproex) tablet,delayed release (DR/EC); 125 mg; amt: 1 tab; oral Special Instructions: qam - mood Once A Day 09:00 AM 01/26/2022- Open Ended Prescription Depakote (divalproex) tablet,delayed release (DR/EC); 250 mg; amt: 1 tab; oral Special Instructions: qam - mood Once A Day 09:00 AM 01/26/2022- Open Ended Prescription Depakote (divalproex) tablet,delayed release (DR/EC); 250 mg; amt: 2 tabs; oral Special Instructions: TAKE 2 tabs (500 MG) PO qhs - SAD At Bedtime 09:00 PM 01/25/2022- Open Ended Prescription Eliquis (apixaban) tablet; 5 mg; amt: 5 MG;  oral Special Instructions: GIVE 5 MG PO BID FOR ANTI-COAG Twice A Day 09:00 AM, 09:00 PM 05/05/2021- Open Ended Prescription Eucerin Skin Calming (emollient combination no.69) cream; - ; amt: 1 application; topical Special Instructions: arms & dry skin qd Once A Day 09:00 PM 05/16/2021- Open Ended Prescription Itch Relief (diphenhydramine) (diphenhydramine hcl) [OTC] gel; 2 %; amt: 1 GRAM; topical Special Instructions: APPLY TOPICALLY TID PRN TO AFFECTED AREA FOR ITCHING Three Times A Day - PRN PRN 1, PRN 2, PRN 3 05/05/2021- Open Ended Prescription Jardiance (empagliflozin) tablet; 10 mg; amt: 1 tab; oral Special Instructions: dm Once A Day 09:00 AM 05/06/2021- Open Ended Prescription lidocaine [OTC] cream; 4 %; amt: general amt; topical Special Instructions: apply to gauze and affix over R posterior auricular area (behind the ear) under cannula tubing for cushion/pain Once A Day 09:00 AM 08/25/2021- Open Ended Prescription Paxil (paroxetine hcl) tablet; 10 mg; amt: 5 mg; oral Special Instructions: GIVE 5 MG = ONE HALF TAB PO Q DAY FOR DEPRESSION/BEHAVIORS At Bedtime 09:00 PM 12/11/2021- Open Ended Prescription Risperdal (risperidone) tablet; 2 mg; amt: 1 tab; oral Special Instructions: psych d/o Once A Day 08:00 AM 01/25/2022- Open Ended Prescription Risperdal (risperidone) tablet; 3 mg; amt: 1 TAB; oral Special Instructions: TAKE 1 TAB PO qhs - SCHIZOAFFECTIVE DISORDER At Bedtime 09:00 PM 01/25/2022- Open Ended Prescription Senna-S (sennosides-docusate sodium) [OTC] tablet; 8.6-50 mg; amt: 2 tabs; oral Special Instructions: bowel aid Once A Day 09:00 AM 05/09/2021- Open Ended Prescription tamsulosin capsule; 0.4 mg; amt: 1 CAPSULE; oral Special Instructions: GIVE 1 CAPSULE PO EVERY OTHER DAY FOR URINE RETENTION Once A Day Every Other Day 09:00 AM 05/06/2021- Open Ended Prescription Tums (calcium carbonate) [OTC] tablet,chewable; 200 mg calcium (500 mg);  amt: 1 TUMS; oral Special Instructions: CHEW 1 TUMS DAILY FOR SUPPLEMENT Once A Day 09:00 AM  Thank you for the opportunity to participate in the care of Becky Hendricks.  The palliative care team will continue to follow. Please call our office at [phone] if we can  be of additional assistance.   Hollace Kinnier, DO  COVID-19 PATIENT SCREENING TOOL Asked and negative response unless otherwise noted:  Have you had symptoms of covid, tested positive or been in contact with someone with symptoms/positive test in the past 5-10 days? No

## 2022-08-30 ENCOUNTER — Inpatient Hospital Stay (HOSPITAL_COMMUNITY)
Admission: EM | Admit: 2022-08-30 | Discharge: 2022-09-08 | DRG: 870 | Disposition: A | Discharge status: Skilled Nursing Facility | Payer: Medicare Other | Attending: Family Medicine | Admitting: Family Medicine

## 2022-08-30 ENCOUNTER — Other Ambulatory Visit: Payer: Self-pay

## 2022-08-30 ENCOUNTER — Emergency Department (HOSPITAL_COMMUNITY): Payer: Medicare Other

## 2022-08-30 ENCOUNTER — Inpatient Hospital Stay (HOSPITAL_COMMUNITY): Payer: Medicare Other

## 2022-08-30 DIAGNOSIS — J9622 Acute and chronic respiratory failure with hypercapnia: Secondary | ICD-10-CM | POA: Diagnosis present

## 2022-08-30 DIAGNOSIS — Z7901 Long term (current) use of anticoagulants: Secondary | ICD-10-CM

## 2022-08-30 DIAGNOSIS — Z881 Allergy status to other antibiotic agents status: Secondary | ICD-10-CM

## 2022-08-30 DIAGNOSIS — G253 Myoclonus: Secondary | ICD-10-CM | POA: Diagnosis not present

## 2022-08-30 DIAGNOSIS — J1008 Influenza due to other identified influenza virus with other specified pneumonia: Secondary | ICD-10-CM | POA: Diagnosis present

## 2022-08-30 DIAGNOSIS — L89812 Pressure ulcer of head, stage 2: Secondary | ICD-10-CM | POA: Diagnosis present

## 2022-08-30 DIAGNOSIS — R451 Restlessness and agitation: Secondary | ICD-10-CM | POA: Diagnosis present

## 2022-08-30 DIAGNOSIS — J1100 Influenza due to unidentified influenza virus with unspecified type of pneumonia: Secondary | ICD-10-CM

## 2022-08-30 DIAGNOSIS — I110 Hypertensive heart disease with heart failure: Secondary | ICD-10-CM | POA: Diagnosis present

## 2022-08-30 DIAGNOSIS — R532 Functional quadriplegia: Secondary | ICD-10-CM | POA: Diagnosis present

## 2022-08-30 DIAGNOSIS — J189 Pneumonia, unspecified organism: Secondary | ICD-10-CM | POA: Diagnosis not present

## 2022-08-30 DIAGNOSIS — R4182 Altered mental status, unspecified: Secondary | ICD-10-CM | POA: Diagnosis not present

## 2022-08-30 DIAGNOSIS — L89896 Pressure-induced deep tissue damage of other site: Secondary | ICD-10-CM | POA: Diagnosis not present

## 2022-08-30 DIAGNOSIS — G9341 Metabolic encephalopathy: Secondary | ICD-10-CM | POA: Diagnosis present

## 2022-08-30 DIAGNOSIS — Z888 Allergy status to other drugs, medicaments and biological substances status: Secondary | ICD-10-CM

## 2022-08-30 DIAGNOSIS — R6521 Severe sepsis with septic shock: Secondary | ICD-10-CM | POA: Diagnosis present

## 2022-08-30 DIAGNOSIS — Z794 Long term (current) use of insulin: Secondary | ICD-10-CM | POA: Diagnosis not present

## 2022-08-30 DIAGNOSIS — Z8616 Personal history of COVID-19: Secondary | ICD-10-CM | POA: Diagnosis not present

## 2022-08-30 DIAGNOSIS — R042 Hemoptysis: Secondary | ICD-10-CM | POA: Diagnosis not present

## 2022-08-30 DIAGNOSIS — R680 Hypothermia, not associated with low environmental temperature: Secondary | ICD-10-CM | POA: Diagnosis present

## 2022-08-30 DIAGNOSIS — I480 Paroxysmal atrial fibrillation: Secondary | ICD-10-CM | POA: Diagnosis present

## 2022-08-30 DIAGNOSIS — G4733 Obstructive sleep apnea (adult) (pediatric): Secondary | ICD-10-CM | POA: Diagnosis present

## 2022-08-30 DIAGNOSIS — I5032 Chronic diastolic (congestive) heart failure: Secondary | ICD-10-CM | POA: Diagnosis present

## 2022-08-30 DIAGNOSIS — J159 Unspecified bacterial pneumonia: Secondary | ICD-10-CM | POA: Diagnosis present

## 2022-08-30 DIAGNOSIS — D638 Anemia in other chronic diseases classified elsewhere: Secondary | ICD-10-CM | POA: Diagnosis present

## 2022-08-30 DIAGNOSIS — R197 Diarrhea, unspecified: Secondary | ICD-10-CM | POA: Diagnosis present

## 2022-08-30 DIAGNOSIS — Z1152 Encounter for screening for COVID-19: Secondary | ICD-10-CM

## 2022-08-30 DIAGNOSIS — Z9911 Dependence on respirator [ventilator] status: Secondary | ICD-10-CM | POA: Diagnosis not present

## 2022-08-30 DIAGNOSIS — J1001 Influenza due to other identified influenza virus with the same other identified influenza virus pneumonia: Secondary | ICD-10-CM | POA: Diagnosis present

## 2022-08-30 DIAGNOSIS — J9621 Acute and chronic respiratory failure with hypoxia: Secondary | ICD-10-CM | POA: Diagnosis present

## 2022-08-30 DIAGNOSIS — E1165 Type 2 diabetes mellitus with hyperglycemia: Secondary | ICD-10-CM | POA: Diagnosis not present

## 2022-08-30 DIAGNOSIS — Z7984 Long term (current) use of oral hypoglycemic drugs: Secondary | ICD-10-CM

## 2022-08-30 DIAGNOSIS — Z91199 Patient's noncompliance with other medical treatment and regimen due to unspecified reason: Secondary | ICD-10-CM

## 2022-08-30 DIAGNOSIS — A419 Sepsis, unspecified organism: Secondary | ICD-10-CM

## 2022-08-30 DIAGNOSIS — J101 Influenza due to other identified influenza virus with other respiratory manifestations: Secondary | ICD-10-CM | POA: Diagnosis present

## 2022-08-30 DIAGNOSIS — Z79899 Other long term (current) drug therapy: Secondary | ICD-10-CM

## 2022-08-30 DIAGNOSIS — R001 Bradycardia, unspecified: Secondary | ICD-10-CM | POA: Diagnosis present

## 2022-08-30 DIAGNOSIS — Z993 Dependence on wheelchair: Secondary | ICD-10-CM

## 2022-08-30 DIAGNOSIS — E785 Hyperlipidemia, unspecified: Secondary | ICD-10-CM | POA: Diagnosis present

## 2022-08-30 DIAGNOSIS — Z7401 Bed confinement status: Secondary | ICD-10-CM

## 2022-08-30 DIAGNOSIS — Z6835 Body mass index (BMI) 35.0-35.9, adult: Secondary | ICD-10-CM

## 2022-08-30 DIAGNOSIS — F319 Bipolar disorder, unspecified: Secondary | ICD-10-CM | POA: Diagnosis present

## 2022-08-30 DIAGNOSIS — Z8674 Personal history of sudden cardiac arrest: Secondary | ICD-10-CM

## 2022-08-30 DIAGNOSIS — E669 Obesity, unspecified: Secondary | ICD-10-CM | POA: Diagnosis present

## 2022-08-30 DIAGNOSIS — J9601 Acute respiratory failure with hypoxia: Principal | ICD-10-CM | POA: Diagnosis present

## 2022-08-30 LAB — URINALYSIS, ROUTINE W REFLEX MICROSCOPIC
Bacteria, UA: NONE SEEN
Bilirubin Urine: NEGATIVE
Glucose, UA: >=500 mg/dL — AB
Hgb urine dipstick: NEGATIVE
Ketones, ur: 80 mg/dL — AB
Leukocytes,Ua: NEGATIVE
Nitrite: NEGATIVE
Protein, ur: NEGATIVE mg/dL
Specific Gravity, Urine: 1.03 (ref 1.005–1.030)
pH: 5 (ref 5.0–8.0)

## 2022-08-30 LAB — COMPREHENSIVE METABOLIC PANEL
ALT: 12 U/L (ref 0–44)
AST: 27 U/L (ref 15–41)
Albumin: 2.7 g/dL — ABNORMAL LOW (ref 3.5–5.0)
Alkaline Phosphatase: 29 U/L — ABNORMAL LOW (ref 38–126)
Anion gap: 13 (ref 5–15)
BUN: 18 mg/dL (ref 8–23)
CO2: 30 mmol/L (ref 22–32)
Calcium: 8.6 mg/dL — ABNORMAL LOW (ref 8.9–10.3)
Chloride: 92 mmol/L — ABNORMAL LOW (ref 98–111)
Creatinine, Ser: 0.88 mg/dL (ref 0.44–1.00)
GFR, Estimated: >60 mL/min (ref >60)
Glucose, Bld: 193 mg/dL — ABNORMAL HIGH (ref 70–99)
Potassium: 4.5 mmol/L (ref 3.5–5.1)
Sodium: 135 mmol/L (ref 135–145)
Total Bilirubin: 1.3 mg/dL — ABNORMAL HIGH (ref 0.3–1.2)
Total Protein: 6.3 g/dL — ABNORMAL LOW (ref 6.5–8.1)

## 2022-08-30 LAB — GLUCOSE, CAPILLARY
Glucose-Capillary: 132 mg/dL — ABNORMAL HIGH (ref 70–99)
Glucose-Capillary: 159 mg/dL — ABNORMAL HIGH (ref 70–99)
Glucose-Capillary: 187 mg/dL — ABNORMAL HIGH (ref 70–99)

## 2022-08-30 LAB — CBC WITH DIFFERENTIAL/PLATELET
Abs Immature Granulocytes: 0.22 10*3/uL — ABNORMAL HIGH (ref 0.00–0.07)
Basophils Absolute: 0.1 10*3/uL (ref 0.0–0.1)
Basophils Relative: 0 %
Eosinophils Absolute: 0 10*3/uL (ref 0.0–0.5)
Eosinophils Relative: 0 %
HCT: 36.7 % (ref 36.0–46.0)
Hemoglobin: 10.7 g/dL — ABNORMAL LOW (ref 12.0–15.0)
Immature Granulocytes: 1 %
Lymphocytes Relative: 14 %
Lymphs Abs: 2.2 10*3/uL (ref 0.7–4.0)
MCH: 25 pg — ABNORMAL LOW (ref 26.0–34.0)
MCHC: 29.2 g/dL — ABNORMAL LOW (ref 30.0–36.0)
MCV: 85.7 fL (ref 80.0–100.0)
Monocytes Absolute: 2.5 10*3/uL — ABNORMAL HIGH (ref 0.1–1.0)
Monocytes Relative: 16 %
Neutro Abs: 11 10*3/uL — ABNORMAL HIGH (ref 1.7–7.7)
Neutrophils Relative %: 69 %
Platelets: 198 10*3/uL (ref 150–400)
RBC: 4.28 MIL/uL (ref 3.87–5.11)
RDW: 16.8 % — ABNORMAL HIGH (ref 11.5–15.5)
WBC: 16 10*3/uL — ABNORMAL HIGH (ref 4.0–10.5)
nRBC: 0.2 % (ref 0.0–0.2)

## 2022-08-30 LAB — APTT: aPTT: 31 seconds (ref 24–36)

## 2022-08-30 LAB — I-STAT VENOUS BLOOD GAS, ED
Acid-Base Excess: 9 mmol/L — ABNORMAL HIGH (ref 0.0–2.0)
Bicarbonate: 37.3 mmol/L — ABNORMAL HIGH (ref 20.0–28.0)
Calcium, Ion: 1.17 mmol/L (ref 1.15–1.40)
HCT: 36 % (ref 36.0–46.0)
Hemoglobin: 12.2 g/dL (ref 12.0–15.0)
O2 Saturation: 98 %
Potassium: 4.5 mmol/L (ref 3.5–5.1)
Sodium: 135 mmol/L (ref 135–145)
TCO2: 39 mmol/L — ABNORMAL HIGH (ref 22–32)
pCO2, Ven: 70.1 mmHg (ref 44–60)
pH, Ven: 7.334 (ref 7.25–7.43)
pO2, Ven: 119 mmHg — ABNORMAL HIGH (ref 32–45)

## 2022-08-30 LAB — I-STAT ARTERIAL BLOOD GAS, ED
Acid-Base Excess: 5 mmol/L — ABNORMAL HIGH (ref 0.0–2.0)
Bicarbonate: 31.8 mmol/L — ABNORMAL HIGH (ref 20.0–28.0)
Calcium, Ion: 1.19 mmol/L (ref 1.15–1.40)
HCT: 29 % — ABNORMAL LOW (ref 36.0–46.0)
Hemoglobin: 9.9 g/dL — ABNORMAL LOW (ref 12.0–15.0)
O2 Saturation: 99 %
Patient temperature: 101.1
Potassium: 3.8 mmol/L (ref 3.5–5.1)
Sodium: 134 mmol/L — ABNORMAL LOW (ref 135–145)
TCO2: 34 mmol/L — ABNORMAL HIGH (ref 22–32)
pCO2 arterial: 64.5 mmHg — ABNORMAL HIGH (ref 32–48)
pH, Arterial: 7.308 — ABNORMAL LOW (ref 7.35–7.45)
pO2, Arterial: 163 mmHg — ABNORMAL HIGH (ref 83–108)

## 2022-08-30 LAB — CORTISOL: Cortisol, Plasma: 23 ug/dL

## 2022-08-30 LAB — STREP PNEUMONIAE URINARY ANTIGEN: Strep Pneumo Urinary Antigen: NEGATIVE

## 2022-08-30 LAB — BRAIN NATRIURETIC PEPTIDE: B Natriuretic Peptide: 111.3 pg/mL — ABNORMAL HIGH (ref 0.0–100.0)

## 2022-08-30 LAB — LACTIC ACID, PLASMA: Lactic Acid, Venous: 1.2 mmol/L (ref 0.5–1.9)

## 2022-08-30 LAB — PROTIME-INR
INR: 1.3 — ABNORMAL HIGH (ref 0.8–1.2)
Prothrombin Time: 15.5 seconds — ABNORMAL HIGH (ref 11.4–15.2)

## 2022-08-30 LAB — CBG MONITORING, ED: Glucose-Capillary: 167 mg/dL — ABNORMAL HIGH (ref 70–99)

## 2022-08-30 LAB — TSH: TSH: 1.083 u[IU]/mL (ref 0.350–4.500)

## 2022-08-30 LAB — TROPONIN I (HIGH SENSITIVITY): Troponin I (High Sensitivity): 27 ng/L — ABNORMAL HIGH (ref <18)

## 2022-08-30 LAB — MRSA NEXT GEN BY PCR, NASAL: MRSA by PCR Next Gen: NOT DETECTED

## 2022-08-30 LAB — RESP PANEL BY RT-PCR (RSV, FLU A&B, COVID)  RVPGX2
Influenza A by PCR: POSITIVE — AB
Influenza B by PCR: NEGATIVE
Resp Syncytial Virus by PCR: NEGATIVE
SARS Coronavirus 2 by RT PCR: NEGATIVE

## 2022-08-30 LAB — PROCALCITONIN: Procalcitonin: 0.81 ng/mL

## 2022-08-30 MED ORDER — NOREPINEPHRINE 4 MG/250ML-% IV SOLN
2.0000 ug/min | INTRAVENOUS | Status: DC
  Administered 2022-08-30: 2 ug/min via INTRAVENOUS
  Administered 2022-08-31: 5 ug/min via INTRAVENOUS
  Filled 2022-08-30 (×3): qty 250

## 2022-08-30 MED ORDER — POLYETHYLENE GLYCOL 3350 17 G PO PACK: 17.0000 g | PACK | Freq: Every day | ORAL | Status: DC | PRN

## 2022-08-30 MED ORDER — SODIUM CHLORIDE 0.9 % IV SOLN
250.0000 mL | INTRAVENOUS | Status: DC
  Administered 2022-08-31: 250 mL via INTRAVENOUS

## 2022-08-30 MED ORDER — DIVALPROEX SODIUM 250 MG PO DR TAB: 500.0000 mg | DELAYED_RELEASE_TABLET | Freq: Two times a day (BID) | ORAL | Status: DC

## 2022-08-30 MED ORDER — ATORVASTATIN CALCIUM 10 MG PO TABS: 10.0000 mg | ORAL_TABLET | Freq: Every day | ORAL | Status: DC

## 2022-08-30 MED ORDER — VANCOMYCIN HCL 2000 MG/400ML IV SOLN
2000.0000 mg | Freq: Once | INTRAVENOUS | Status: AC
  Administered 2022-08-30: 2000 mg via INTRAVENOUS
  Filled 2022-08-30: qty 400

## 2022-08-30 MED ORDER — RISPERIDONE 3 MG PO TABS
3.0000 mg | ORAL_TABLET | Freq: Two times a day (BID) | ORAL | Status: DC
  Administered 2022-08-30 – 2022-08-31 (×2): 3 mg
  Filled 2022-08-30 (×2): qty 1

## 2022-08-30 MED ORDER — PAROXETINE HCL 10 MG PO TABS: 5.0000 mg | ORAL_TABLET | Freq: Every day | ORAL | Status: DC

## 2022-08-30 MED ORDER — SODIUM CHLORIDE 0.9 % IV SOLN
500.0000 mg | Freq: Every day | INTRAVENOUS | Status: DC
  Administered 2022-08-30 – 2022-08-31 (×2): 500 mg via INTRAVENOUS
  Filled 2022-08-30 (×2): qty 5

## 2022-08-30 MED ORDER — VITAL HIGH PROTEIN PO LIQD
1000.0000 mL | ORAL | Status: DC
  Filled 2022-08-30: qty 1000

## 2022-08-30 MED ORDER — OSELTAMIVIR PHOSPHATE 6 MG/ML PO SUSR
75.0000 mg | Freq: Two times a day (BID) | ORAL | Status: AC
  Administered 2022-08-30 – 2022-09-03 (×10): 75 mg
  Filled 2022-08-30 (×11): qty 12.5

## 2022-08-30 MED ORDER — LACTATED RINGERS IV BOLUS (SEPSIS)
2000.0000 mL | Freq: Once | INTRAVENOUS | Status: AC
  Administered 2022-08-30: 2000 mL via INTRAVENOUS

## 2022-08-30 MED ORDER — APIXABAN 5 MG PO TABS
5.0000 mg | ORAL_TABLET | Freq: Two times a day (BID) | ORAL | Status: DC
  Administered 2022-08-30 (×2): 5 mg
  Filled 2022-08-30 (×2): qty 1

## 2022-08-30 MED ORDER — ETOMIDATE 2 MG/ML IV SOLN
INTRAVENOUS | Status: DC | PRN
  Administered 2022-08-30: 30 mg via INTRAVENOUS

## 2022-08-30 MED ORDER — MIDAZOLAM HCL 2 MG/2ML IJ SOLN
1.0000 mg | INTRAMUSCULAR | Status: DC | PRN
  Administered 2022-08-30 – 2022-08-31 (×4): 2 mg via INTRAVENOUS
  Filled 2022-08-30 (×5): qty 2

## 2022-08-30 MED ORDER — FENTANYL CITRATE PF 50 MCG/ML IJ SOSY: 25.0000 ug | PREFILLED_SYRINGE | INTRAMUSCULAR | Status: DC | PRN

## 2022-08-30 MED ORDER — ACETAMINOPHEN 650 MG RE SUPP
650.0000 mg | Freq: Once | RECTAL | Status: AC
  Administered 2022-08-30: 650 mg via RECTAL
  Filled 2022-08-30: qty 1

## 2022-08-30 MED ORDER — SUCCINYLCHOLINE CHLORIDE 200 MG/10ML IV SOSY: 150.0000 mg | PREFILLED_SYRINGE | Freq: Once | INTRAVENOUS | Status: DC

## 2022-08-30 MED ORDER — SODIUM CHLORIDE 0.9 % IV SOLN
2.0000 g | Freq: Once | INTRAVENOUS | Status: AC
  Administered 2022-08-30: 2 g via INTRAVENOUS
  Filled 2022-08-30: qty 12.5

## 2022-08-30 MED ORDER — PANTOPRAZOLE SODIUM 40 MG IV SOLR
40.0000 mg | Freq: Every day | INTRAVENOUS | Status: DC
  Administered 2022-08-30 – 2022-09-02 (×4): 40 mg via INTRAVENOUS
  Filled 2022-08-30 (×4): qty 10

## 2022-08-30 MED ORDER — FENTANYL BOLUS VIA INFUSION
25.0000 ug | INTRAVENOUS | Status: DC | PRN
  Administered 2022-08-31: 100 ug via INTRAVENOUS

## 2022-08-30 MED ORDER — DOCUSATE SODIUM 50 MG/5ML PO LIQD: 100.0000 mg | Freq: Two times a day (BID) | ORAL | Status: DC | PRN

## 2022-08-30 MED ORDER — SUCCINYLCHOLINE CHLORIDE 20 MG/ML IJ SOLN
INTRAMUSCULAR | Status: DC | PRN
  Administered 2022-08-30: 150 mg via INTRAVENOUS

## 2022-08-30 MED ORDER — SENNOSIDES-DOCUSATE SODIUM 8.6-50 MG PO TABS
2.0000 | ORAL_TABLET | Freq: Every day | ORAL | Status: DC
  Administered 2022-09-01: 2
  Filled 2022-08-30 (×2): qty 2

## 2022-08-30 MED ORDER — RISPERIDONE 3 MG PO TABS: 3.0000 mg | ORAL_TABLET | Freq: Two times a day (BID) | ORAL | Status: DC

## 2022-08-30 MED ORDER — ETOMIDATE 2 MG/ML IV SOLN: 30.0000 mg | Freq: Once | INTRAVENOUS | Status: DC

## 2022-08-30 MED ORDER — ATORVASTATIN CALCIUM 10 MG PO TABS
10.0000 mg | ORAL_TABLET | Freq: Every day | ORAL | Status: DC
  Administered 2022-08-30 – 2022-09-05 (×7): 10 mg
  Filled 2022-08-30 (×7): qty 1

## 2022-08-30 MED ORDER — LACTATED RINGERS IV BOLUS (SEPSIS): 1000.0000 mL | Freq: Once | INTRAVENOUS | Status: DC

## 2022-08-30 MED ORDER — FENTANYL CITRATE PF 50 MCG/ML IJ SOSY
25.0000 ug | PREFILLED_SYRINGE | INTRAMUSCULAR | Status: DC | PRN
  Administered 2022-08-30: 50 ug via INTRAVENOUS
  Administered 2022-08-30: 100 ug via INTRAVENOUS
  Filled 2022-08-30: qty 1
  Filled 2022-08-30: qty 2

## 2022-08-30 MED ORDER — ACETAMINOPHEN 325 MG PO TABS
650.0000 mg | ORAL_TABLET | Freq: Four times a day (QID) | ORAL | Status: DC | PRN
  Administered 2022-08-30 – 2022-09-02 (×5): 650 mg
  Filled 2022-08-30 (×5): qty 2

## 2022-08-30 MED ORDER — SODIUM CHLORIDE 0.9 % IV BOLUS
1000.0000 mL | Freq: Once | INTRAVENOUS | Status: AC
  Administered 2022-08-30: 1000 mL via INTRAVENOUS

## 2022-08-30 MED ORDER — TAMSULOSIN HCL 0.4 MG PO CAPS: 0.4000 mg | ORAL_CAPSULE | ORAL | Status: DC

## 2022-08-30 MED ORDER — SODIUM CHLORIDE 0.9 % IV SOLN: INTRAVENOUS | Status: DC

## 2022-08-30 MED ORDER — APIXABAN 5 MG PO TABS: 5.0000 mg | ORAL_TABLET | Freq: Two times a day (BID) | ORAL | Status: DC

## 2022-08-30 MED ORDER — ROCURONIUM BROMIDE 10 MG/ML (PF) SYRINGE
PREFILLED_SYRINGE | INTRAVENOUS | Status: AC
  Filled 2022-08-30: qty 10

## 2022-08-30 MED ORDER — SODIUM CHLORIDE 0.9 % IV SOLN
2.0000 g | Freq: Every day | INTRAVENOUS | Status: DC
  Administered 2022-08-31: 2 g via INTRAVENOUS
  Filled 2022-08-30: qty 20

## 2022-08-30 MED ORDER — FENTANYL 2500MCG IN NS 250ML (10MCG/ML) PREMIX INFUSION
25.0000 ug/h | INTRAVENOUS | Status: DC
  Administered 2022-08-30: 50 ug/h via INTRAVENOUS
  Administered 2022-08-31: 150 ug/h via INTRAVENOUS
  Administered 2022-09-01: 125 ug/h via INTRAVENOUS
  Filled 2022-08-30 (×3): qty 250

## 2022-08-30 MED ORDER — ACETAMINOPHEN 650 MG RE SUPP
650.0000 mg | RECTAL | Status: DC | PRN
  Filled 2022-08-30: qty 1

## 2022-08-30 MED ORDER — LACTATED RINGERS IV BOLUS (SEPSIS)
1000.0000 mL | Freq: Once | INTRAVENOUS | Status: AC
  Administered 2022-08-30: 1000 mL via INTRAVENOUS

## 2022-08-30 NOTE — ED Notes (Signed)
Pt Temp rechecked orally and was 100.86F validated temp with temp sensing foley is incorrect. Temp sensing foley cord changed

## 2022-08-30 NOTE — ED Notes (Signed)
RN switched levo to [place] IV

## 2022-08-30 NOTE — Progress Notes (Signed)
An USGPIV (ultrasound guided PIV) has been placed for short-term vasopressor infusion. A correctly placed ivWatch must be used when administering Vasopressors. Should this treatment be needed beyond 72 hours, central line access should be obtained.  It will be the responsibility of the bedside nurse to follow best practice to prevent extravasations.   ?

## 2022-08-30 NOTE — ED Provider Notes (Addendum)
[facility] EMERGENCY DEPARTMENT Provider Note   CSN: [id number] Arrival date & time: 08/30/22  3086     History  Chief Complaint  Patient presents with   Altered Mental Status    Becky Hendricks is a 77 y.o. female.  Per my view level 5 caveat due to altered mental status.  Patient brought in by EMS unresponsive on nonrebreather.  Found to be hypoxic in the 80s.  Comes from skilled nursing facility.  She is full code with a MOST form.  This was confirmed on the phone with her brother who lives in [place].  Patient had fever with EMS of 103.  Per brother had not heard from her since yesterday.  Seems of gotten sick overnight.  Patient with history of atrial fibrillation on Eliquis.  History of diabetes.  History of osteomyelitis.  Patient on furosemide, blood thinners.  No recent illness.  Has been out of the hospital for about a year.  Overall patient has had significant muscle wasting and barely moves arms at baseline.  Fairly bedbound.  The history is provided by the EMS personnel and a relative.       Home Medications Prior to Admission medications   Medication Sig Start Date End Date Taking? Authorizing Provider  apixaban (ELIQUIS) 5 MG TABS tablet Take 5 mg by mouth 2 (two) times daily.    [provider]  atorvastatin (LIPITOR) 10 MG tablet Take 10 mg by mouth at bedtime.    [provider]  calcium carbonate (TUMS - DOSED IN MG ELEMENTAL CALCIUM) 500 MG chewable tablet Chew 500 mg by mouth daily.    [provider]  carvedilol (COREG) 3.125 MG tablet Take 3.125 mg by mouth at bedtime. 11/13/21   [provider]  diphenhydrAMINE (BENADRYL) 2 % cream Apply 1 application topically 3 (three) times daily as needed for itching.    [provider]  divalproex (DEPAKOTE) 250 MG DR tablet Take 3 tablets (750 mg total) by mouth 2 (two) times daily. Patient taking differently: Take 500 mg by mouth 2 (two) times daily. 04/19/21    Pennie Banter, DO  empagliflozin (JARDIANCE) 10 MG TABS tablet Take 10 mg by mouth daily.    [provider]  furosemide (LASIX) 20 MG tablet Take 1 tablet (20 mg total) by mouth daily. Patient not taking: Reported on 12/10/2021 05/06/21   Marinda Elk, MD  insulin lispro (HUMALOG) 100 UNIT/ML injection Inject 2-12 Units into the skin in the morning and at bedtime. CBG 70 - 200: 0 units CBG 201 - 250: 2 units CBG 251 - 300: 4 units CBG 301 - 350: 6 units CBG 351 - 400: 8 units CBG 401 - 450: 10 units CBG 451 - 600: 12 units If CBG is > 450, give 12 units, recheck in 2 hours. If still >350, notify provider Patient not taking: Reported on 12/10/2021    [provider]  Ipratropium-Albuterol (COMBIVENT RESPIMAT) 20-100 MCG/ACT AERS respimat Inhale 1 puff into the lungs every 12 (twelve) hours.    [provider]  lidocaine (LMX) 4 % cream Apply 1 application. topically daily. Apply to guaze and place behind the ear under cannula tubing for cushion or pain    [provider]  PARoxetine (PAXIL) 10 MG tablet Take 5 mg by mouth daily. 12/04/21   [provider]  risperiDONE (RISPERDAL) 3 MG tablet Take 3 mg by mouth 2 (two) times daily. 11/09/21   [provider]  sennosides-docusate sodium (SENOKOT-S) 8.6-50 MG tablet Take 2 tablets by mouth daily. Hold if having loose or frequent stools 04/19/21   Ezekiel Slocumb, DO  Skin Protectants, Misc. (EUCERIN) cream Apply 1 application. topically daily. Apply to arms and dry skin    [provider]  spironolactone (ALDACTONE) 25 MG tablet Take 12.5 mg by mouth daily. 11/27/21   [provider]  tamsulosin (FLOMAX) 0.4 MG CAPS capsule Take 1 capsule (0.4 mg total) by mouth every other day. 04/20/21   Ezekiel Slocumb, DO      Allergies    Chlorhexidine gluconate, Vancomycin, and Acetazolamide er    Review of Systems   Review of Systems  Physical Exam Updated Vital Signs BP  (!) 107/50   Pulse 81   Temp (!) 91.1 F (32.8 C)   Resp (!) 24   Ht 5\' 6"  (1.676 m)   LMP  (LMP Unknown)   SpO2 100%   BMI 33.77 kg/m  Physical Exam Constitutional:      General: She is in acute distress.     Appearance: She is ill-appearing.     Comments: Patient opens eyes spontaneously but does not follow commands  HENT:     Mouth/Throat:     Mouth: Mucous membranes are dry.  Eyes:     Pupils: Pupils are equal, round, and reactive to light.     Comments: Does seem to track somewhat laterally with eyes and extraocular motions appear to be intact  Cardiovascular:     Rate and Rhythm: Tachycardia present.  Pulmonary:     Breath sounds: Rhonchi present.     Comments: Increased work of breathing, nonrebreather Neurological:     Comments: Patient does not follow commands, does not move arms or legs but that could be baseline, does not speak, opens eyes spontaneously, does appear to wince and cry to painful stimuli     ED Results / Procedures / Treatments   Labs (all labs ordered are listed, but only abnormal results are displayed) Labs Reviewed  RESP PANEL BY RT-PCR (RSV, FLU A&B, COVID)  RVPGX2 - Abnormal; Notable for the following components:      Result Value   Influenza A by PCR POSITIVE (*)    All other components within normal limits  COMPREHENSIVE METABOLIC PANEL - Abnormal; Notable for the following components:   Chloride 92 (*)    Glucose, Bld 193 (*)    Calcium 8.6 (*)    Total Protein 6.3 (*)    Albumin 2.7 (*)    Alkaline Phosphatase 29 (*)    Total Bilirubin 1.3 (*)    All other components within normal limits  CBC WITH DIFFERENTIAL/PLATELET - Abnormal; Notable for the following components:   WBC 16.0 (*)    Hemoglobin 10.7 (*)    MCH 25.0 (*)    MCHC 29.2 (*)    RDW 16.8 (*)    Neutro Abs 11.0 (*)    Monocytes Absolute 2.5 (*)    Abs Immature Granulocytes 0.22 (*)    All other components within normal limits  PROTIME-INR - Abnormal; Notable for  the following components:   Prothrombin Time 15.5 (*)    INR 1.3 (*)    All other components within normal limits  URINALYSIS, ROUTINE W REFLEX MICROSCOPIC - Abnormal; Notable for the following components:   Glucose, UA >=500 (*)    Ketones, ur 80 (*)    All other components within normal limits  I-STAT VENOUS BLOOD GAS, ED -  Abnormal; Notable for the following components:   pCO2, Ven 70.1 (*)    pO2, Ven 119 (*)    Bicarbonate 37.3 (*)    TCO2 39 (*)    Acid-Base Excess 9.0 (*)    All other components within normal limits  I-STAT ARTERIAL BLOOD GAS, ED - Abnormal; Notable for the following components:   pH, Arterial 7.308 (*)    pCO2 arterial 64.5 (*)    pO2, Arterial 163 (*)    Bicarbonate 31.8 (*)    TCO2 34 (*)    Acid-Base Excess 5.0 (*)    Sodium 134 (*)    HCT 29.0 (*)    Hemoglobin 9.9 (*)    All other components within normal limits  TROPONIN I (HIGH SENSITIVITY) - Abnormal; Notable for the following components:   Troponin I (High Sensitivity) 27 (*)    All other components within normal limits  CULTURE, BLOOD (ROUTINE X 2)  CULTURE, BLOOD (ROUTINE X 2)  URINE CULTURE  LACTIC ACID, PLASMA  APTT  BRAIN NATRIURETIC PEPTIDE  BLOOD GAS, ARTERIAL    EKG EKG Interpretation  Date/Time:  Thursday August 30 2022 07:24:19 EST Ventricular Rate:  126 PR Interval:  116 QRS Duration: 86 QT Interval:  342 QTC Calculation: 496 R Axis:   32 Text Interpretation: Sinus tachycardia Probable left atrial enlargement Abnormal T, consider ischemia, diffuse leads Confirmed by Lennice Sites ([id number]) on 08/30/2022 7:39:20 AM  Radiology DG Chest Port 1 View  Result Date: 08/30/2022 CLINICAL DATA:  ETT/NG tube placement EXAM: PORTABLE CHEST 1 VIEW COMPARISON:  Chest x-ray dated December 09, 2021 FINDINGS: Interval placement of ET tube with tip proximally 2 cm from the carina. NG tube is partially visualized coursing below the diaphragm. Cardiac and mediastinal contours are unchanged.  Elevation of the right hemidiaphragm and bibasilar atelectasis. No large pleural effusion or evidence of pneumothorax. IMPRESSION: 1. Interval placement of ET tube with tip proximally 2 cm from the carina. 2. NG tube is partially visualized coursing below the diaphragm. Electronically Signed   By: Yetta Glassman M.D.   On: 08/30/2022 08:32    Procedures .Critical Care  Performed by: Lennice Sites, DO Authorized by: Lennice Sites, DO   Critical care provider statement:    Critical care time (minutes):  40   Critical care was necessary to treat or prevent imminent or life-threatening deterioration of the following conditions:  Respiratory failure and sepsis   Critical care was time spent personally by me on the following activities:  Blood draw for specimens, development of treatment plan with patient or surrogate, discussions with primary provider, evaluation of patient's response to treatment, examination of patient, obtaining history from patient or surrogate, ordering and performing treatments and interventions, ordering and review of laboratory studies, ordering and review of radiographic studies, pulse oximetry, re-evaluation of patient's condition and review of old charts   I assumed direction of critical care for this patient from another provider in my specialty: no   Procedure Name: Intubation Date/Time: 08/30/2022 8:17 AM  Performed by: Lennice Sites, DOPre-anesthesia Checklist: Patient identified, Patient being monitored, Emergency Drugs available, Timeout performed and Suction available Oxygen Delivery Method: Ambu bag Preoxygenation: Pre-oxygenation with 100% oxygen Induction Type: Rapid sequence Ventilation: Mask ventilation without difficulty Laryngoscope Size: Glidescope and 3 Grade View: Grade I Tube size: 7.5 mm Number of attempts: 1 Airway Equipment and Method: Video-laryngoscopy and Rigid stylet Placement Confirmation: ETT inserted through vocal cords under direct  vision, CO2 detector and Breath sounds checked- equal and  bilateral Secured at: 24 cm Dental Injury: Teeth and Oropharynx as per pre-operative assessment  Difficulty Due To: Difficulty was unanticipated Future Recommendations: Recommend- induction with short-acting agent, and alternative techniques readily available        Medications Ordered in ED Medications  etomidate (AMIDATE) injection 30 mg (30 mg Intravenous Not Given 08/30/22 0746)  succinylcholine (ANECTINE) syringe 150 mg (150 mg Intravenous Not Given 08/30/22 0746)  etomidate (AMIDATE) injection (30 mg Intravenous Given 08/30/22 0733)  succinylcholine (ANECTINE) injection (150 mg Intravenous Given 08/30/22 0737)  midazolam (VERSED) injection 1-2 mg (has no administration in time range)  fentaNYL (SUBLIMAZE) injection 25 mcg (has no administration in time range)  fentaNYL (SUBLIMAZE) injection 25-100 mcg (has no administration in time range)  acetaminophen (TYLENOL) suppository 650 mg (has no administration in time range)  vancomycin (VANCOREADY) IVPB 2000 mg/400 mL (2,000 mg Intravenous New Bag/Given 08/30/22 0757)  lactated ringers bolus 2,000 mL (2,000 mLs Intravenous New Bag/Given 08/30/22 0903)  acetaminophen (TYLENOL) suppository 650 mg (650 mg Rectal Given 08/30/22 0801)  ceFEPIme (MAXIPIME) 2 g in sodium chloride 0.9 % 100 mL IVPB (0 g Intravenous Stopped 08/30/22 0828)  lactated ringers bolus 1,000 mL (1,000 mLs Intravenous New Bag/Given 08/30/22 0801)    ED Course/ Medical Decision Making/ A&P                             Medical Decision Making Amount and/or Complexity of Data Reviewed Labs: ordered. Radiology: ordered. ECG/medicine tests: ordered.  Risk OTC drugs. Prescription drug management. Decision regarding hospitalization.   Levon Penning is here with altered mental status/unresponsiveness.  Comorbidities include atrial fibrillation on Eliquis, hypertension, diabetes, sounds like functional quadriplegia,  bedbound.  Patient per EMS found to have fever to 103, hypoxia in the 80s.  She arrives on nonrebreather.  She is unresponsive but does seem to open her eyes spontaneously.  She does not follow commands.  I talked with her brother on the phone and confirmed that she is full code.  She appears to have a lot of secretions.  Ultimately decision was made to intubate her with etomidate and succinylcholine.  This was done with a grade 1 view with glide scope.  No complications.  She had a temp Foley placed with a temperature of 100.4.  She is tachycardic.  Code sepsis initiated.  Broad-spectrum IV antibiotics started.  My suspicion is for pneumonia as she did have some mucus and some purulence within the upper airway that I saw on intubation.  She does have a history of heart failure as well.  My suspicion is that differential diagnosis is likely sepsis causing this issue.  Seems less likely to be cardiopulmonary otherwise.  Will get chest x-ray, blood cultures, urine studies, BNP and troponin.  Broad-spectrum IV antibiotics started.  IV fluids started.  Anticipate admission to ICU.  Per my review of chest x-ray, ET tube in adequate placement.  Seems to have right basilar opacity although appears unchanged from prior x-rays.  Per my review interpretation labs she does have a white count of 16.  Lactic acid is normal.  Point-of-care is unremarkable.  Venous blood gas showed a pH of 7.3 but a CO2 of 70.  No significant electrolyte abnormalities.  No significant anemia.  Overall my suspicion is either pneumonia or viral process causing respiratory failure with hypercarbia.  She seems hemodynamically stable on vent.  She did have a fever of 101.1.  Broad-spectrum IV antibiotics have  been started.  Urinalysis is pending.  To be admitted to ICU team for further care.  My suspicion is for sepsis.  Patient positive for influenza A.  This chart was dictated using voice recognition software.  Despite best efforts to  proofread,  errors can occur which can change the documentation meaning.     Final Clinical Impression(s) / ED Diagnoses Final diagnoses:  Acute respiratory failure with hypoxia (HCC)  Altered mental status, unspecified altered mental status type  Sepsis, due to unspecified organism, unspecified whether acute organ dysfunction present [facility])  Influenza A    Rx / DC Orders ED Discharge Orders     None         Lennice Sites, DO 08/30/22 [place], [place], DO 08/30/22 [phone]

## 2022-08-30 NOTE — ED Notes (Signed)
RN has exchanged temp foley cord 2 times. Its reading inaccurate temps. Oral temp 101

## 2022-08-30 NOTE — Sepsis Progress Note (Signed)
Elink following code sepsis

## 2022-08-30 NOTE — H&P (Signed)
NAME:  Becky Hendricks, MRN:  [id number], DOB:  November 19, 1945, LOS: 0 ADMISSION DATE:  08/30/2022, CONSULTATION DATE: 08/30/2022 REFERRING MD: Emergency department physician, CHIEF COMPLAINT: Altered mental status, acute respiratory distress presumed sepsis  History of Present Illness:  77 year old female who was brought in by emergency medical services from skilled nursing facility unresponsive on nonrebreather.  She required urgent intubation by the emergency department physician and was considered to be septic and treated with fluid resuscitation and empirical antimicrobial therapy.  Pulmonary critical care has been asked to evaluate into the minute.  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  Nurses report no skin breakdown to the posterior aspect of her body.  She does have contractures of her right.  She does not follow commands.  She is currently being treated with broad-spectrum antibiotics fluid resuscitation and admission to the intensive care unit.  Pertinent  Medical History   Past Medical History:  Diagnosis Date   Atrial fibrillation (HCC)    Bipolar disorder (HCC)    Bipolar disorder (HCC) 04/27/2020   Clostridium difficile diarrhea 08/02/2019   Dehydration    Essential hypertension    Fall 02/11/2020   Morbid obesity (HCC)    Osteomyelitis (HCC) 12/03/2019   Pneumonia due to COVID-19 virus 09/15/2019   Type 2 diabetes mellitus (HCC)    Weakness      Significant Hospital Events: Including procedures, antibiotic start and stop dates in addition to other pertinent events   08/30/2022 intubated  Interim History / Subjective:  Admitted with altered mental status respiratory distress intubated by the emergency  Objective   Blood pressure (!) 103/54, pulse 77, temperature (!) 84.7 F (29.3 C), resp. rate (!) 24, height 5\' 6"  (1.676 m), SpO2 100 %.    Vent Mode: PRVC FiO2 (%):  [100 %] 100 % Set Rate:  [18 bmp] 18 bmp Vt Set:  [420 mL-470 mL] 470  mL PEEP:  [5 cmH20] 5 cmH20 Plateau Pressure:  [18 cmH20] 18 cmH20  No intake or output data in the 24 hours ending 08/30/22 0934 There were no vitals filed for this visit.  Examination: General: Elderly female and on ventilator support HENT: Endotracheal tube and orogastric Lungs: Coarse rhonchi bilaterally Cardiovascular: Heart sounds are irregular Abdomen: Soft positive bowel sounds Extremities: Contractures right side greater than left Neuro: Recently intubated neuromuscular blockade  Resolved Hospital Problem list     Assessment & Plan:  Ventilator dependent respiratory failure in the setting acute respiratory distress, altered mental status, presumed sepsis with hypotension.  Flu a positive Arterial blood gases Chest x-ray Transferred to the intensive care unit Ventilator changes placed with arterial blood gases Wean per protocol note she has global weakness which is nearly quadriparetic in nature due to recent illness Tamiflu but unknown,duration positive. . Presumed sepsis Check procalcitonin Empirical antimicrobial therapy Panculture  Altered mental status CT of the head Hold sedation for now  History of atrial fibrillation Twelve-lead EKG  Bipolar disorder Medications as needed  Best Practice (right click and "Reselect all SmartList Selections" daily)   Diet/type: NPO DVT prophylaxis: not indicated GI prophylaxis: PPI Lines: N/A Foley:  N/A Code Status:  full code Last date of multidisciplinary goals of care discussion [tbd] full code per her request of her brother 09/01/22 Labs   CBC: Recent Labs  Lab 08/30/22 0743 08/30/22 0744 08/30/22 0848  WBC 16.0*  --   --   NEUTROABS 11.0*  --   --   HGB  10.7* 12.2 9.9*  HCT 36.7 36.0 29.0*  MCV 85.7  --   --   PLT 198  --   --     Basic Metabolic Panel: Recent Labs  Lab 08/30/22 0743 08/30/22 0744 08/30/22 0848  NA 135 135 134*  K 4.5 4.5 3.8  CL 92*  --   --   CO2 30  --   --    GLUCOSE 193*  --   --   BUN 18  --   --   CREATININE 0.88  --   --   CALCIUM 8.6*  --   --    GFR: CrCl cannot be calculated (Unknown ideal weight.). Recent Labs  Lab 08/30/22 0743  WBC 16.0*  LATICACIDVEN 1.2    Liver Function Tests: Recent Labs  Lab 08/30/22 0743  AST 27  ALT 12  ALKPHOS 29*  BILITOT 1.3*  PROT 6.3*  ALBUMIN 2.7*   No results for input(s): "LIPASE", "AMYLASE" in the last 168 hours. No results for input(s): "AMMONIA" in the last 168 hours.  ABG    Component Value Date/Time   PHART 7.308 (L) 08/30/2022 0848   PCO2ART 64.5 (H) 08/30/2022 0848   PO2ART 163 (H) 08/30/2022 0848   HCO3 31.8 (H) 08/30/2022 0848   TCO2 34 (H) 08/30/2022 0848   O2SAT 99 08/30/2022 0848     Coagulation Profile: Recent Labs  Lab 08/30/22 0743  INR 1.3*    Cardiac Enzymes: No results for input(s): "CKTOTAL", "CKMB", "CKMBINDEX", "TROPONINI" in the last 168 hours.  HbA1C: Hgb A1c MFr Bld  Date/Time Value Ref Range Status  03/30/2021 03:11 AM 7.0 (H) 4.8 - 5.6 % Final    Comment:    (NOTE) Pre diabetes:          5.7%-6.4%  Diabetes:              >6.4%  Glycemic control for   <7.0% adults with diabetes   11/18/2020 01:54 AM 6.1 (H) 4.8 - 5.6 % Final    Comment:    (NOTE) Pre diabetes:          5.7%-6.4%  Diabetes:              >6.4%  Glycemic control for   <7.0% adults with diabetes     CBG: No results for input(s): "GLUCAP" in the last 168 hours.  Review of Systems:   Na   Past Medical History:  She,  has a past medical history of Atrial fibrillation ([place]), Bipolar disorder ([place]), Bipolar disorder ([place]) (04/27/2020), Clostridium difficile diarrhea (08/02/2019), Dehydration, Essential hypertension, Fall (02/11/2020), Morbid obesity ([place]), Osteomyelitis ([place]) (12/03/2019), Pneumonia due to COVID-19 virus (09/15/2019), Type 2 diabetes mellitus ([place]), and Weakness.   Surgical History:   Past Surgical History:  Procedure Laterality Date   INCISION AND  DRAINAGE PERIRECTAL ABSCESS Left 12/04/2019   Procedure: IRRIGATION AND DEBRIDEMENT LEFT BUTTOCK ABSCESS;  Surgeon: Georganna Skeans, MD;  Location: [facility];  Service: General;  Laterality: Left;   INCISION AND DRAINAGE PERIRECTAL ABSCESS Left 12/10/2019   Procedure: REPEAT IRRIGATION AND DEBRIDEMENT BUTTOCK  ABSCESS;  Surgeon: Coralie Keens, MD;  Location: [facility];  Service: General;  Laterality: Left;   IR FLUORO GUIDE CV LINE RIGHT  11/25/2020   IR RADIOLOGIST EVAL & MGMT  12/29/2020   IR US GUIDE VASC ACCESS RIGHT  11/25/2020     Social History:   reports that she has never smoked. She has never used smokeless tobacco. She reports that she does  not currently use alcohol. She reports that she does not currently use drugs.   Family History:  Her Family history is unknown by patient.   Allergies Allergies  Allergen Reactions   Chlorhexidine Gluconate Itching   Vancomycin     Vancomycin infusion related reaction- May need Vanc to be given at a slower rate    Acetazolamide Er Rash     Home Medications  Prior to Admission medications   Medication Sig Start Date End Date Taking? Authorizing Provider  apixaban (ELIQUIS) 5 MG TABS tablet Take 5 mg by mouth 2 (two) times daily.    [provider]  atorvastatin (LIPITOR) 10 MG tablet Take 10 mg by mouth at bedtime.    [provider]  calcium carbonate (TUMS - DOSED IN MG ELEMENTAL CALCIUM) 500 MG chewable tablet Chew 500 mg by mouth daily.    [provider]  carvedilol (COREG) 3.125 MG tablet Take 3.125 mg by mouth at bedtime. 11/13/21   [provider]  diphenhydrAMINE (BENADRYL) 2 % cream Apply 1 application topically 3 (three) times daily as needed for itching.    [provider]  divalproex (DEPAKOTE) 250 MG DR tablet Take 3 tablets (750 mg total) by mouth 2 (two) times daily. Patient taking differently: Take 500 mg by mouth 2 (two) times daily. 04/19/21   Ezekiel Slocumb, DO  empagliflozin  (JARDIANCE) 10 MG TABS tablet Take 10 mg by mouth daily.    [provider]  furosemide (LASIX) 20 MG tablet Take 1 tablet (20 mg total) by mouth daily. Patient not taking: Reported on 12/10/2021 05/06/21   Charlynne Cousins, MD  insulin lispro (HUMALOG) 100 UNIT/ML injection Inject 2-12 Units into the skin in the morning and at bedtime. CBG 70 - 200: 0 units CBG 201 - 250: 2 units CBG 251 - 300: 4 units CBG 301 - 350: 6 units CBG 351 - 400: 8 units CBG 401 - 450: 10 units CBG 451 - 600: 12 units If CBG is > 450, give 12 units, recheck in 2 hours. If still >350, notify provider Patient not taking: Reported on 12/10/2021    [provider]  Ipratropium-Albuterol (COMBIVENT RESPIMAT) 20-100 MCG/ACT AERS respimat Inhale 1 puff into the lungs every 12 (twelve) hours.    [provider]  lidocaine (LMX) 4 % cream Apply 1 application. topically daily. Apply to guaze and place behind the ear under cannula tubing for cushion or pain    [provider]  PARoxetine (PAXIL) 10 MG tablet Take 5 mg by mouth daily. 12/04/21   [provider]  risperiDONE (RISPERDAL) 3 MG tablet Take 3 mg by mouth 2 (two) times daily. 11/09/21   [provider]  sennosides-docusate sodium (SENOKOT-S) 8.6-50 MG tablet Take 2 tablets by mouth daily. Hold if having loose or frequent stools 04/19/21   Ezekiel Slocumb, DO  Skin Protectants, Misc. (EUCERIN) cream Apply 1 application. topically daily. Apply to arms and dry skin    [provider]  spironolactone (ALDACTONE) 25 MG tablet Take 12.5 mg by mouth daily. 11/27/21   [provider]  tamsulosin (FLOMAX) 0.4 MG CAPS capsule Take 1 capsule (0.4 mg total) by mouth every other day. 04/20/21   Ezekiel Slocumb, DO     Critical care time: 39 min    Richardson Landry Thaddeus Evitts ACNP Acute Care Nurse Practitioner [place] Please consult Amion 08/30/2022, 9:35 AM

## 2022-08-30 NOTE — ED Notes (Signed)
Pt had large bowel movement, RN and NT cleaned pt, new sheet, chuck, and diaper applied. Peri care performed

## 2022-08-30 NOTE — ED Triage Notes (Signed)
Pt BIB EMS due to being unresponsive. Pt was LSN yesterday. Pt was found this morning unresponsive. Pt is actively aspirating on arrival on NRB. Pt has quadriplegic. VSS.

## 2022-08-30 NOTE — ED Notes (Signed)
Pt intubated at 0735. 7 1/2 and 24 @ the lip

## 2022-08-30 NOTE — Progress Notes (Signed)
Pt transported on vent from ED to 2M09 without any complications. RN at bedside, RT will monitor.

## 2022-08-30 NOTE — ED Notes (Signed)
MD notified about temp and BP

## 2022-08-31 ENCOUNTER — Inpatient Hospital Stay (HOSPITAL_COMMUNITY): Payer: Medicare Other

## 2022-08-31 DIAGNOSIS — J9601 Acute respiratory failure with hypoxia: Secondary | ICD-10-CM | POA: Diagnosis not present

## 2022-08-31 DIAGNOSIS — Z9911 Dependence on respirator [ventilator] status: Secondary | ICD-10-CM

## 2022-08-31 DIAGNOSIS — G253 Myoclonus: Secondary | ICD-10-CM | POA: Diagnosis not present

## 2022-08-31 DIAGNOSIS — R4182 Altered mental status, unspecified: Secondary | ICD-10-CM | POA: Diagnosis not present

## 2022-08-31 LAB — POCT I-STAT 7, (LYTES, BLD GAS, ICA,H+H)
Acid-base deficit: 1 mmol/L (ref 0.0–2.0)
Bicarbonate: 23.1 mmol/L (ref 20.0–28.0)
Calcium, Ion: 1.13 mmol/L — ABNORMAL LOW (ref 1.15–1.40)
HCT: 25 % — ABNORMAL LOW (ref 36.0–46.0)
Hemoglobin: 8.5 g/dL — ABNORMAL LOW (ref 12.0–15.0)
O2 Saturation: 98 %
Patient temperature: 99.3
Potassium: 3.4 mmol/L — ABNORMAL LOW (ref 3.5–5.1)
Sodium: 136 mmol/L (ref 135–145)
TCO2: 24 mmol/L (ref 22–32)
pCO2 arterial: 33.7 mmHg (ref 32–48)
pH, Arterial: 7.446 (ref 7.35–7.45)
pO2, Arterial: 108 mmHg (ref 83–108)

## 2022-08-31 LAB — PHOSPHORUS: Phosphorus: 2.1 mg/dL — ABNORMAL LOW (ref 2.5–4.6)

## 2022-08-31 LAB — GLUCOSE, CAPILLARY
Glucose-Capillary: 129 mg/dL — ABNORMAL HIGH (ref 70–99)
Glucose-Capillary: 143 mg/dL — ABNORMAL HIGH (ref 70–99)
Glucose-Capillary: 144 mg/dL — ABNORMAL HIGH (ref 70–99)
Glucose-Capillary: 151 mg/dL — ABNORMAL HIGH (ref 70–99)
Glucose-Capillary: 175 mg/dL — ABNORMAL HIGH (ref 70–99)
Glucose-Capillary: 193 mg/dL — ABNORMAL HIGH (ref 70–99)

## 2022-08-31 LAB — BASIC METABOLIC PANEL
Anion gap: 10 (ref 5–15)
BUN: 19 mg/dL (ref 8–23)
CO2: 26 mmol/L (ref 22–32)
Calcium: 7.6 mg/dL — ABNORMAL LOW (ref 8.9–10.3)
Chloride: 102 mmol/L (ref 98–111)
Creatinine, Ser: 0.73 mg/dL (ref 0.44–1.00)
GFR, Estimated: >60 mL/min (ref >60)
Glucose, Bld: 129 mg/dL — ABNORMAL HIGH (ref 70–99)
Potassium: 3.2 mmol/L — ABNORMAL LOW (ref 3.5–5.1)
Sodium: 138 mmol/L (ref 135–145)

## 2022-08-31 LAB — URINE CULTURE: Culture: NO GROWTH

## 2022-08-31 LAB — C DIFFICILE QUICK SCREEN W PCR REFLEX
C Diff antigen: NEGATIVE
C Diff interpretation: NOT DETECTED
C Diff toxin: NEGATIVE

## 2022-08-31 LAB — CBC
HCT: 28.3 % — ABNORMAL LOW (ref 36.0–46.0)
Hemoglobin: 8.5 g/dL — ABNORMAL LOW (ref 12.0–15.0)
MCH: 24.9 pg — ABNORMAL LOW (ref 26.0–34.0)
MCHC: 30 g/dL (ref 30.0–36.0)
MCV: 82.7 fL (ref 80.0–100.0)
Platelets: 143 10*3/uL — ABNORMAL LOW (ref 150–400)
RBC: 3.42 MIL/uL — ABNORMAL LOW (ref 3.87–5.11)
RDW: 17.5 % — ABNORMAL HIGH (ref 11.5–15.5)
WBC: 12.4 10*3/uL — ABNORMAL HIGH (ref 4.0–10.5)
nRBC: 0 % (ref 0.0–0.2)

## 2022-08-31 LAB — MAGNESIUM: Magnesium: 1.6 mg/dL — ABNORMAL LOW (ref 1.7–2.4)

## 2022-08-31 MED ORDER — ORAL CARE MOUTH RINSE: 15.0000 mL | OROMUCOSAL | Status: DC | PRN

## 2022-08-31 MED ORDER — SODIUM CHLORIDE 0.9 % IV SOLN
2.0000 g | Freq: Every day | INTRAVENOUS | Status: AC
  Administered 2022-09-01 – 2022-09-05 (×5): 2 g via INTRAVENOUS
  Filled 2022-08-31 (×5): qty 20

## 2022-08-31 MED ORDER — PROPOFOL 1000 MG/100ML IV EMUL
INTRAVENOUS | Status: AC
  Administered 2022-08-31: 10 ug/kg/min via INTRAVENOUS
  Filled 2022-08-31: qty 100

## 2022-08-31 MED ORDER — INSULIN ASPART 100 UNIT/ML IJ SOLN
1.0000 [IU] | INTRAMUSCULAR | Status: DC
  Administered 2022-08-31: 3 [IU] via SUBCUTANEOUS
  Administered 2022-08-31 (×2): 1 [IU] via SUBCUTANEOUS
  Administered 2022-08-31 – 2022-09-01 (×2): 2 [IU] via SUBCUTANEOUS
  Administered 2022-09-01: 1 [IU] via SUBCUTANEOUS
  Administered 2022-09-01 (×4): 2 [IU] via SUBCUTANEOUS
  Administered 2022-09-02: 3 [IU] via SUBCUTANEOUS
  Administered 2022-09-02 (×2): 1 [IU] via SUBCUTANEOUS
  Administered 2022-09-02: 3 [IU] via SUBCUTANEOUS
  Administered 2022-09-02: 1 [IU] via SUBCUTANEOUS

## 2022-08-31 MED ORDER — MIDAZOLAM HCL 2 MG/2ML IJ SOLN
2.0000 mg | Freq: Once | INTRAMUSCULAR | Status: AC
  Administered 2022-08-31: 2 mg via INTRAVENOUS

## 2022-08-31 MED ORDER — POTASSIUM CHLORIDE 20 MEQ PO PACK
40.0000 meq | PACK | Freq: Once | ORAL | Status: AC
  Administered 2022-08-31: 40 meq
  Filled 2022-08-31: qty 2

## 2022-08-31 MED ORDER — THIAMINE MONONITRATE 100 MG PO TABS
100.0000 mg | ORAL_TABLET | Freq: Every day | ORAL | Status: AC
  Administered 2022-08-31 – 2022-09-04 (×5): 100 mg
  Filled 2022-08-31 (×5): qty 1

## 2022-08-31 MED ORDER — PROSOURCE TF20 ENFIT COMPATIBL EN LIQD
60.0000 mL | Freq: Every day | ENTERAL | Status: DC
  Administered 2022-08-31 – 2022-09-05 (×6): 60 mL
  Filled 2022-08-31 (×6): qty 60

## 2022-08-31 MED ORDER — PROPOFOL 1000 MG/100ML IV EMUL: 5.0000 ug/kg/min | INTRAVENOUS | Status: DC

## 2022-08-31 MED ORDER — ENOXAPARIN SODIUM 100 MG/ML IJ SOSY
1.0000 mg/kg | PREFILLED_SYRINGE | Freq: Two times a day (BID) | INTRAMUSCULAR | Status: DC
  Administered 2022-08-31 – 2022-09-03 (×7): 100 mg via SUBCUTANEOUS
  Filled 2022-08-31 (×7): qty 1

## 2022-08-31 MED ORDER — RISPERIDONE 3 MG PO TABS
3.0000 mg | ORAL_TABLET | Freq: Every evening | ORAL | Status: DC
  Administered 2022-09-01 – 2022-09-05 (×5): 3 mg
  Filled 2022-08-31 (×6): qty 1

## 2022-08-31 MED ORDER — SUCCINYLCHOLINE CHLORIDE 200 MG/10ML IV SOSY
PREFILLED_SYRINGE | INTRAVENOUS | Status: AC
  Filled 2022-08-31: qty 10

## 2022-08-31 MED ORDER — OSMOLITE 1.5 CAL PO LIQD
1000.0000 mL | ORAL | Status: DC
  Administered 2022-08-31 – 2022-09-02 (×2): 1000 mL
  Filled 2022-08-31 (×4): qty 1000

## 2022-08-31 MED ORDER — OSMOLITE 1.5 CAL PO LIQD
1000.0000 mL | ORAL | Status: DC
  Administered 2022-08-31: 1000 mL
  Filled 2022-08-31: qty 1000

## 2022-08-31 MED ORDER — ETOMIDATE 2 MG/ML IV SOLN
20.0000 mg | Freq: Once | INTRAVENOUS | Status: DC
  Filled 2022-08-31: qty 10

## 2022-08-31 MED ORDER — DOXYCYCLINE HYCLATE 100 MG PO TABS
100.0000 mg | ORAL_TABLET | Freq: Two times a day (BID) | ORAL | Status: AC
  Administered 2022-08-31 – 2022-09-05 (×10): 100 mg
  Filled 2022-08-31 (×10): qty 1

## 2022-08-31 MED ORDER — POTASSIUM PHOSPHATES 15 MMOLE/5ML IV SOLN
30.0000 mmol | Freq: Once | INTRAVENOUS | Status: AC
  Administered 2022-08-31: 30 mmol via INTRAVENOUS
  Filled 2022-08-31: qty 10

## 2022-08-31 MED ORDER — SUCCINYLCHOLINE CHLORIDE 200 MG/10ML IV SOSY
50.0000 mg | PREFILLED_SYRINGE | Freq: Once | INTRAVENOUS | Status: AC
  Administered 2022-08-31: 50 mg via INTRAVENOUS

## 2022-08-31 MED ORDER — PHENYLEPHRINE 80 MCG/ML (10ML) SYRINGE FOR IV PUSH (FOR BLOOD PRESSURE SUPPORT)
PREFILLED_SYRINGE | INTRAVENOUS | Status: AC
  Administered 2022-08-31: 320 ug
  Filled 2022-08-31: qty 10

## 2022-08-31 MED ORDER — SUCCINYLCHOLINE CHLORIDE 200 MG/10ML IV SOSY
100.0000 mg | PREFILLED_SYRINGE | Freq: Once | INTRAVENOUS | Status: AC
  Administered 2022-08-31: 100 mg via INTRAVENOUS
  Filled 2022-08-31: qty 10

## 2022-08-31 MED ORDER — MAGNESIUM SULFATE 4 GM/100ML IV SOLN
4.0000 g | Freq: Once | INTRAVENOUS | Status: AC
  Administered 2022-08-31: 4 g via INTRAVENOUS
  Filled 2022-08-31: qty 100

## 2022-08-31 MED ORDER — ORAL CARE MOUTH RINSE
15.0000 mL | OROMUCOSAL | Status: DC
  Administered 2022-08-31 – 2022-09-05 (×64): 15 mL via OROMUCOSAL

## 2022-08-31 MED ORDER — RISPERIDONE 2 MG PO TABS
2.0000 mg | ORAL_TABLET | Freq: Every day | ORAL | Status: DC
  Administered 2022-09-01 – 2022-09-05 (×5): 2 mg
  Filled 2022-08-31 (×7): qty 1

## 2022-08-31 NOTE — Progress Notes (Signed)
An USGPIV (ultrasound guided PIV) has been placed for short-term vasopressor infusion. A correctly placed ivWatch must be used when administering Vasopressors. Should this treatment be needed beyond 72 hours, central line access should be obtained.  It will be the responsibility of the bedside nurse to follow best practice to prevent extravasations.   ?

## 2022-08-31 NOTE — Progress Notes (Signed)
Tracheal aspirate sent to lab

## 2022-08-31 NOTE — Procedures (Addendum)
Patient Name: Becky Hendricks  MRN: [id number]  Epilepsy Attending: Lora Havens  Referring Physician/Provider: [name], DO  Date: 08/31/2022 Duration: 24.59 mins  Patient history: 77 year old female with altered mental status.  EEG to evaluate for seizure.  Level of alertness:  comatose  AEDs during EEG study: Propofol  Technical aspects: This EEG study was done with scalp electrodes positioned according to the 10-20 International system of electrode placement. Electrical activity was reviewed with band pass filter of 1-70Hz , sensitivity of 7 uV/mm, display speed of 65mm/sec with a 60Hz  notched filter applied as appropriate. EEG data were recorded continuously and digitally stored.  Video monitoring was available and reviewed as appropriate.  Description: EEG showed continuous generalized 3 to 5 Hz theta-delta slowing.  At [phone] and at 1006, patient was noted to have right arm tremor-like movement, predominantly after being stimulated.  Concomitant EEG before, during and after the event did not show any EEG change to suggest seizure.  Hyperventilation and photic stimulation were not performed.     ABNORMALITY - Continuous slow, generalized  IMPRESSION: This study is suggestive of moderate to severe diffuse encephalopathy, nonspecific etiology.  No seizures or epileptiform discharges were seen throughout the recording.  Two episodes ( at [phone] and 10060 of right arm tremor-like movement were noted predominantly after stimulation without concomitant EEG change.  These episodes were most likely not epileptic.  Myoclonus could have similar appearance.  Walfred Bettendorf Barbra Sarks

## 2022-08-31 NOTE — Progress Notes (Signed)
STAT EEG complete - results pending. ? ?

## 2022-08-31 NOTE — Procedures (Signed)
Intubation Procedure Note  Becky Hendricks  [id number]  Mar 09, 1946  Date:08/31/22  Time:11:42 AM   Provider Performing:Alysandra Lobue P Carlis Abbott    Procedure: Intubation (22633)  Indication(s) Respiratory Failure  Consent Unable to obtain consent due to emergent nature of procedure.   Anesthesia Succinylcholine and sedation drips per [facility]   Time Out Verified patient identification, verified procedure, site/side was marked, verified correct patient position, special equipment/implants available, medications/allergies/relevant history reviewed, required imaging and test results available.   Sterile Technique Usual hand hygeine, masks, and gloves were used   Procedure Description Patient positioned in bed supine.  NMB given in addition to continuous sedation as noted above.  Patient had an ETT exchange over airway exchange catheter due to blown cuff and losing 50% of her tidal volumes and minute ventilation.  Number of attempts was 1.  Colorimetric CO2 detector was consistent with tracheal placement. Bilateral breath sounds confirmed.   Complications/Tolerance None; patient tolerated the procedure well. Hypotension before intubation treated with increased NE and 2 mL of  neosynephrine push. Chest X-ray is ordered to verify placement.   EBL 0 cc   Specimen(s) None   Becky Hy, DO 08/31/22 11:44 AM [facility]  For contact information, see Amion. If no response to pager, please call PCCM consult pager. After hours, 7PM- 7AM, please call Elink.

## 2022-08-31 NOTE — Progress Notes (Signed)
NAME:  Becky Hendricks, MRN:  [id number], DOB:  07-Feb-1946, LOS: 1 ADMISSION DATE:  08/30/2022, CONSULTATION DATE: 08/30/2022 REFERRING MD: Emergency department physician, CHIEF COMPLAINT: Altered mental status, acute respiratory distress presumed sepsis  History of Present Illness:  77 year old female who was brought in by emergency medical services from skilled nursing facility unresponsive on nonrebreather.  She required urgent intubation by the emergency department physician and was considered to be septic and treated with fluid resuscitation and empirical antimicrobial therapy.  Pulmonary critical care has been asked to evaluate into the minute.  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  Nurses report no skin breakdown to the posterior aspect of her body.  She does have contractures of her right.  She does not follow commands.  She is currently being treated with broad-spectrum antibiotics fluid resuscitation and admission to the intensive care unit.  Pertinent  Medical History   Past Medical History:  Diagnosis Date   Atrial fibrillation ([place])    Bipolar disorder ([place])    Bipolar disorder ([place]) 04/27/2020   Clostridium difficile diarrhea 08/02/2019   Dehydration    Essential hypertension    Fall 02/11/2020   Morbid obesity ([place])    Osteomyelitis ([place]) 12/03/2019   Pneumonia due to COVID-19 virus 09/15/2019   Type 2 diabetes mellitus ([place])    Weakness      Significant Hospital Events: Including procedures, antibiotic start and stop dates in addition to other pertinent events   08/30/2022 intubated  Interim History / Subjective:  Episodes of A-fib with RVR overnight  Objective   Blood pressure (!) 118/57, pulse 70, temperature 100.2 F (37.9 C), resp. rate (!) 24, height 5\' 6"  (1.676 m), weight 99.6 kg, SpO2 97 %.    Vent Mode: PRVC FiO2 (%):  [40 %-100 %] 40 % Set Rate:  [18 bmp-24 bmp] 24 bmp Vt Set:  [420 mL-470 mL] 470 mL PEEP:  [5 cmH20] 5  cmH20 Plateau Pressure:  [12 BJY78-29 cmH20] 12 cmH20   Intake/Output Summary (Last 24 hours) at 08/31/2022 0715 Last data filed at 08/31/2022 0600 Gross per 24 hour  Intake 3087.08 ml  Output 788 ml  Net 2299.08 ml   Filed Weights   08/31/22 0349  Weight: 99.6 kg    Examination: General: Critically ill-appearing woman lying in bed intubated, lightly sedated HENT: [place]/AT, eyes anicteric Lungs: CTAB Cardiovascular: S1-S2, regular rate and rhythm, later tachycardic and irregular Abdomen: Obese, soft, nontender Extremities: No significant edema, no cyanosis Neuro: Initially this morning RASS 0, able to follow commands-lifted both legs off the bed when asked to wiggle her toes.  Has a baseline body tremor.  Able to lift both arms off the bed.  Subsequently had an episode of staring off in space with whole body shaking.  CXR personally reviewed> RLL pneumonia. ETT in appropriate position. Blood culture>pending Urine culture> pending  Sodium 138 Potassium 3.2 Magnesium 1.6 WBC 12.4 H/H 8.5/28.3 Platelets 143  Resolved Hospital Problem list     Assessment & Plan:  Ventilator dependent acute respiratory failure with hypoxia & hypercapnia due to influenza A pneumonia H/o OSA with bipap noncompliance -LTVV -VAP prevention protocol -PAD protocol for sedation -daily SAT & SBT as appropriate -worry about potential need for trach eventually due to weakness -tamiflu, vanc, ceftriaxone, azithromycin  Septic shock, hypothermia due to RLL pneumonia -con't antibiotics -follow cultures -vasopressors PRN to maintain MAP >56  Acute metabolic encephalopathy -Started back on propofol after what was thought to possibly  be a seizure. - EEG this morning  History of atrial fibrillation -monitor on tele -switch to lovenox while in ICU due to potential need for procedures  H/o Bipolar disorder -con't PTA risperdal  Acute on chronic anemia - Continue to monitor bleeding and trend  CBC - Transfuse for hemoglobin less than 7 or hemodynamically significant bleeding.  H/o pelvic OM Functional quadriplegia> my exam this morning does not corroborate this -worry she is high risk of needing tracheostomy -Minimize sedation is much as able  Diarrhea, h/o c diff -check c diff now  H/o DM2 -SSI PRN -goal BG 140-180  H/o HTN -hold PTA antihypertensives while on pressors  Prolonged Qtc, but EKG demonstrated TW in first half of R-R interval -monitor on tele; has follow up EKG ordered -switch azithromcycin to doxycycline  Myoclonus; concern for seizure activity today with whole body shaking and staring off in space after initially being more awake -EEG  Best Practice (right click and "Reselect all SmartList Selections" daily)   Diet/type: NPO DVT prophylaxis: systemic dose LMWH GI prophylaxis: PPI Lines: N/A Foley:  N/A Code Status:  full code Last date of multidisciplinary goals of care discussion [1/19- full code per her request of her [name]] Labs   CBC: Recent Labs  Lab 08/30/22 0743 08/30/22 0744 08/30/22 0848  WBC 16.0*  --   --   NEUTROABS 11.0*  --   --   HGB 10.7* 12.2 9.9*  HCT 36.7 36.0 29.0*  MCV 85.7  --   --   PLT 198  --   --      Basic Metabolic Panel: Recent Labs  Lab 08/30/22 0743 08/30/22 0744 08/30/22 0848  NA 135 135 134*  K 4.5 4.5 3.8  CL 92*  --   --   CO2 30  --   --   GLUCOSE 193*  --   --   BUN 18  --   --   CREATININE 0.88  --   --   CALCIUM 8.6*  --   --      This patient is critically ill with multiple organ system failure which requires frequent high complexity decision making, assessment, support, evaluation, and titration of therapies. This was completed through the application of advanced monitoring technologies and extensive interpretation of multiple databases. During this encounter critical care time was devoted to patient care services described in this note for 60 minutes.  [name], DO 08/31/22 7:11 PM [facility]  For contact information, see Amion. If no response to pager, please call PCCM consult pager. After hours, 7PM- 7AM, please call Elink.

## 2022-08-31 NOTE — Progress Notes (Signed)
Pt noted to have full body shakes and right sided gaze, up able to respond. PRN versed given. MD made aware. See new orders.

## 2022-08-31 NOTE — Progress Notes (Addendum)
Episode of staring off in space, shaking, Afib with RVR, biting ETT this morning. Given versed before I saw her, still having while body shaking (looks like bed is doing CPT), eyes open but staring off. Biting hard on ETT, unable to pass suction catheter.   Additional 2mg  versed now, start propofol, STAT EEG.  [name], DO 08/31/22 8:45 AM [facility]   Still biting down despite more sedation causing limited tidal volumes. Will give one-time dose of succinylcholine to facilitate placement of bite block while sedated.  [name], DO 08/31/22 8:53 AM [facility]

## 2022-08-31 NOTE — Progress Notes (Signed)
ETT exchanged due to audible cuff leak, low volumes. 7.5 secured 24 @lip .

## 2022-08-31 NOTE — Progress Notes (Signed)
Initial Nutrition Assessment  DOCUMENTATION CODES:  Obesity unspecified  INTERVENTION:  Begin advancing tube feeding via OGT: Osmolite 1.5 at 55 ml/h (1320 ml per day) Advance to 80mL/h and advance by 67mL q8h to goal of 55 Prosource TF20 60 ml 1x/d Provides 2060 kcal, 103 gm protein, 1006 ml free water daily Thiamine 100mg  x 5 days for possible refeeding  NUTRITION DIAGNOSIS:  Inadequate oral intake related to inability to eat as evidenced by NPO status.  GOAL:  Patient will meet greater than or equal to 90% of their needs  MONITOR:  Vent status, Labs, Weight trends, TF tolerance  REASON FOR ASSESSMENT:  Consult Enteral/tube feeding initiation and management (trickle)  ASSESSMENT:  Pt with hx of HTN, DM type 2, atrial fibrillation, functional quadriplegia, and CHF presented to ED from SNF with AMS and hypoxia. Found to be flu+ and required intubation in ED.  Patient is currently intubated on ventilator support. No family in room at the time of assessment to provide a hx. Pitting edema noted to the BLE on exam. No muscle or fat deficits noted at this time but they may be obscured by fluid.   TF regimen started overnight per protocol. Discussed with attending, ok to advance past a trickle. Pt currently with low K, Mg, and phosphorus on AM labs suggestive of refeeding. Will add thiamine and advance slowly to monitor/replace altered labs.  Pt added to cortrak list by CCM but decided to hold on placement as pt has OGT access. Not anticipated to lose access through the weekend.  MV: 11.2 L/min Temp (24hrs), Avg:100.4 F (38 C), Min:80.6 F (27 C), Max:103.5 F (39.7 C)  Propofol: 5.98 ml/hr (158kcal/d)   Intake/Output Summary (Last 24 hours) at 08/31/2022 1318 Last data filed at 08/31/2022 1200 Gross per 24 hour  Intake 4216.36 ml  Output 438 ml  Net 3778.36 ml  Net IO Since Admission: 3,428.36 mL [08/31/22 1318]   Nutritionally Relevant Medications: Scheduled Meds:   atorvastatin  10 mg Per Tube QHS   VITAL HIGH PROTEIN  1,000 mL Per Tube Q24H   insulin aspart  1-3 Units Subcutaneous Q4H   pantoprazole  40 mg Intravenous Daily   senna-docusate  2 tablet Per Tube Daily   Continuous Infusions:  sodium chloride 75 mL/hr at 08/31/22 0600   cefTRIAXone (ROCEPHIN)  IV     norepinephrine (LEVOPHED) Adult infusion 5 mcg/min (08/31/22 0600)   propofol (DIPRIVAN) infusion 10 mcg/kg/min (08/31/22 0847)   PRN Meds: docusate, polyethylene glycol  Labs Reviewed: K 3.2 Phosphorus 2.1 Mg 2.1 CBG ranges from 129-187 mg/dL over the last 24 hours   NUTRITION - FOCUSED PHYSICAL EXAM: Flowsheet Row Most Recent Value  Orbital Region No depletion  Upper Arm Region No depletion  Thoracic and Lumbar Region No depletion  Buccal Region No depletion  Temple Region No depletion  Clavicle Bone Region No depletion  Clavicle and Acromion Bone Region No depletion  Scapular Bone Region No depletion  Dorsal Hand No depletion  Patellar Region No depletion  Anterior Thigh Region No depletion  Posterior Calf Region No depletion  Edema (RD Assessment) Moderate  [pitting edema to the BLE]  Hair Reviewed  Eyes Reviewed  Mouth Reviewed  Skin Reviewed  Nails Reviewed   Diet Order:   Diet Order             Diet NPO time specified  Diet effective now  EDUCATION NEEDS:  Not appropriate for education at this time  Skin:  Skin Assessment: Reviewed RN Assessment  Last BM:  1/18 - type 7  Height:  Ht Readings from Last 1 Encounters:  08/30/22 5\' 6"  (1.676 m)    Weight:  Wt Readings from Last 1 Encounters:  08/31/22 99.6 kg    Ideal Body Weight:  59.1 kg  BMI:  Body mass index is 35.44 kg/m.  Estimated Nutritional Needs:  Kcal:  1800-2000 kcal/d Protein:  100-115g/d Fluid:  2-2.2L/d    Ranell Patrick, RD, LDN Clinical Dietitian RD pager # available in [name]  After hours/weekend pager # available in [facility]

## 2022-09-01 ENCOUNTER — Inpatient Hospital Stay (HOSPITAL_COMMUNITY): Payer: Medicare Other

## 2022-09-01 DIAGNOSIS — J9601 Acute respiratory failure with hypoxia: Secondary | ICD-10-CM | POA: Diagnosis not present

## 2022-09-01 DIAGNOSIS — Z9911 Dependence on respirator [ventilator] status: Secondary | ICD-10-CM | POA: Diagnosis not present

## 2022-09-01 LAB — POCT I-STAT 7, (LYTES, BLD GAS, ICA,H+H)
Acid-Base Excess: 2 mmol/L (ref 0.0–2.0)
Acid-Base Excess: 3 mmol/L — ABNORMAL HIGH (ref 0.0–2.0)
Bicarbonate: 29.3 mmol/L — ABNORMAL HIGH (ref 20.0–28.0)
Bicarbonate: 31.6 mmol/L — ABNORMAL HIGH (ref 20.0–28.0)
Calcium, Ion: 1.13 mmol/L — ABNORMAL LOW (ref 1.15–1.40)
Calcium, Ion: 1.14 mmol/L — ABNORMAL LOW (ref 1.15–1.40)
HCT: 28 % — ABNORMAL LOW (ref 36.0–46.0)
HCT: 29 % — ABNORMAL LOW (ref 36.0–46.0)
Hemoglobin: 9.5 g/dL — ABNORMAL LOW (ref 12.0–15.0)
Hemoglobin: 9.9 g/dL — ABNORMAL LOW (ref 12.0–15.0)
O2 Saturation: 87 %
O2 Saturation: 99 %
Patient temperature: 98.4
Patient temperature: 98.4
Potassium: 4.2 mmol/L (ref 3.5–5.1)
Potassium: 4.3 mmol/L (ref 3.5–5.1)
Sodium: 138 mmol/L (ref 135–145)
Sodium: 138 mmol/L (ref 135–145)
TCO2: 31 mmol/L (ref 22–32)
TCO2: 34 mmol/L — ABNORMAL HIGH (ref 22–32)
pCO2 arterial: 58.8 mmHg — ABNORMAL HIGH (ref 32–48)
pCO2 arterial: 75.4 mmHg (ref 32–48)
pH, Arterial: 7.305 — ABNORMAL LOW (ref 7.35–7.45)
pH, Arterial: 7.23 — ABNORMAL LOW (ref 7.35–7.45)
pO2, Arterial: 59 mmHg — ABNORMAL LOW (ref 83–108)
pO2, Arterial: 158 mmHg — ABNORMAL HIGH (ref 83–108)

## 2022-09-01 LAB — GLUCOSE, CAPILLARY
Glucose-Capillary: 180 mg/dL — ABNORMAL HIGH (ref 70–99)
Glucose-Capillary: 168 mg/dL — ABNORMAL HIGH (ref 70–99)
Glucose-Capillary: 165 mg/dL — ABNORMAL HIGH (ref 70–99)
Glucose-Capillary: 161 mg/dL — ABNORMAL HIGH (ref 70–99)
Glucose-Capillary: 141 mg/dL — ABNORMAL HIGH (ref 70–99)

## 2022-09-01 LAB — BASIC METABOLIC PANEL
Anion gap: 8 (ref 5–15)
BUN: 19 mg/dL (ref 8–23)
CO2: 25 mmol/L (ref 22–32)
Calcium: 7.2 mg/dL — ABNORMAL LOW (ref 8.9–10.3)
Chloride: 103 mmol/L (ref 98–111)
Creatinine, Ser: 0.66 mg/dL (ref 0.44–1.00)
GFR, Estimated: >60 mL/min (ref >60)
Glucose, Bld: 183 mg/dL — ABNORMAL HIGH (ref 70–99)
Potassium: 3.8 mmol/L (ref 3.5–5.1)
Sodium: 136 mmol/L (ref 135–145)

## 2022-09-01 LAB — PHOSPHORUS: Phosphorus: 2 mg/dL — ABNORMAL LOW (ref 2.5–4.6)

## 2022-09-01 LAB — TRIGLYCERIDES: Triglycerides: 134 mg/dL (ref <150)

## 2022-09-01 LAB — MAGNESIUM: Magnesium: 2.2 mg/dL (ref 1.7–2.4)

## 2022-09-01 MED ORDER — FENTANYL CITRATE PF 50 MCG/ML IJ SOSY: 50.0000 ug | PREFILLED_SYRINGE | Freq: Once | INTRAMUSCULAR | Status: AC

## 2022-09-01 MED ORDER — POLYETHYLENE GLYCOL 3350 17 G PO PACK
17.0000 g | PACK | Freq: Every day | ORAL | Status: DC
  Administered 2022-09-02 – 2022-09-05 (×4): 17 g
  Filled 2022-09-01 (×5): qty 1

## 2022-09-01 MED ORDER — MIDAZOLAM HCL 2 MG/2ML IJ SOLN: 2.0000 mg | Freq: Once | INTRAMUSCULAR | Status: DC | PRN

## 2022-09-01 MED ORDER — POTASSIUM PHOSPHATES 15 MMOLE/5ML IV SOLN
15.0000 mmol | Freq: Once | INTRAVENOUS | Status: AC
  Administered 2022-09-01: 15 mmol via INTRAVENOUS
  Filled 2022-09-01: qty 5

## 2022-09-01 MED ORDER — FENTANYL CITRATE PF 50 MCG/ML IJ SOSY
PREFILLED_SYRINGE | INTRAMUSCULAR | Status: AC
  Administered 2022-09-01: 50 ug via INTRAVENOUS
  Filled 2022-09-01: qty 4

## 2022-09-01 MED ORDER — MIDAZOLAM HCL 2 MG/2ML IJ SOLN
2.0000 mg | Freq: Once | INTRAMUSCULAR | Status: DC
  Filled 2022-09-01: qty 2

## 2022-09-01 MED ORDER — IPRATROPIUM-ALBUTEROL 0.5-2.5 (3) MG/3ML IN SOLN
3.0000 mL | RESPIRATORY_TRACT | Status: DC | PRN
  Administered 2022-09-01 – 2022-09-06 (×3): 3 mL via RESPIRATORY_TRACT
  Filled 2022-09-01 (×3): qty 3

## 2022-09-01 MED ORDER — IPRATROPIUM-ALBUTEROL 0.5-2.5 (3) MG/3ML IN SOLN: 3.0000 mL | Freq: Four times a day (QID) | RESPIRATORY_TRACT | Status: DC | PRN

## 2022-09-01 MED ORDER — FENTANYL CITRATE PF 50 MCG/ML IJ SOSY: 50.0000 ug | PREFILLED_SYRINGE | Freq: Once | INTRAMUSCULAR | Status: DC | PRN

## 2022-09-01 MED ORDER — FAMOTIDINE 20 MG PO TABS
20.0000 mg | ORAL_TABLET | Freq: Two times a day (BID) | ORAL | Status: DC
  Administered 2022-09-02 – 2022-09-05 (×9): 20 mg
  Filled 2022-09-01 (×9): qty 1

## 2022-09-01 MED ORDER — FENTANYL CITRATE PF 50 MCG/ML IJ SOSY
25.0000 ug | PREFILLED_SYRINGE | Freq: Once | INTRAMUSCULAR | Status: AC
  Administered 2022-09-02: 25 ug via INTRAVENOUS
  Filled 2022-09-01: qty 1

## 2022-09-01 MED ORDER — ETOMIDATE 2 MG/ML IV SOLN
INTRAVENOUS | Status: AC
  Administered 2022-09-01: 20 mg via INTRAVENOUS
  Filled 2022-09-01: qty 10

## 2022-09-01 MED ORDER — ROCURONIUM BROMIDE 10 MG/ML (PF) SYRINGE
PREFILLED_SYRINGE | INTRAVENOUS | Status: AC
  Administered 2022-09-01: 100 mg via INTRAVENOUS
  Filled 2022-09-01: qty 10

## 2022-09-01 MED ORDER — DOCUSATE SODIUM 50 MG/5ML PO LIQD
100.0000 mg | Freq: Two times a day (BID) | ORAL | Status: DC
  Administered 2022-09-02 – 2022-09-05 (×8): 100 mg
  Filled 2022-09-01 (×8): qty 10

## 2022-09-01 MED ORDER — ETOMIDATE 2 MG/ML IV SOLN: 20.0000 mg | Freq: Once | INTRAVENOUS | Status: AC

## 2022-09-01 MED ORDER — FENTANYL BOLUS VIA INFUSION
25.0000 ug | INTRAVENOUS | Status: DC | PRN
  Administered 2022-09-02: 50 ug via INTRAVENOUS

## 2022-09-01 MED ORDER — FENTANYL 2500MCG IN NS 250ML (10MCG/ML) PREMIX INFUSION
25.0000 ug/h | INTRAVENOUS | Status: DC
  Administered 2022-09-02: 25 ug/h via INTRAVENOUS
  Administered 2022-09-05: 50 ug/h via INTRAVENOUS
  Filled 2022-09-01 (×2): qty 250

## 2022-09-01 MED ORDER — INSULIN GLARGINE-YFGN 100 UNIT/ML ~~LOC~~ SOLN
5.0000 [IU] | Freq: Every day | SUBCUTANEOUS | Status: DC
  Administered 2022-09-01 – 2022-09-02 (×2): 5 [IU] via SUBCUTANEOUS
  Filled 2022-09-01 (×2): qty 0.05

## 2022-09-01 MED ORDER — ROCURONIUM BROMIDE 50 MG/5ML IV SOLN
100.0000 mg | Freq: Once | INTRAVENOUS | Status: AC
  Filled 2022-09-01: qty 10

## 2022-09-01 NOTE — Progress Notes (Signed)
Patient has allergy listed to CHG with reaction as itching. With bath, this RN was not aware and used CHG wipes. Will notify MD and monitor patient for itching/rash and continue to notify oncoming shifts that patient is NOT to receive CHG wipes with bath.

## 2022-09-01 NOTE — Progress Notes (Addendum)
eLink Physician-Brief Progress Note Patient Name: Becky Hendricks DOB: [birth year] MRN: [id number]   Date of Service  09/01/2022  HPI/Events of Note  Was extubated at 25. Was doing well but now has no cough and copious brown secretions. Sats 93% on 6L. Diaphoretic. A & O x1   Camera: Discussed with RN. VS stable. Thick secretions, weak cough. NG tube in place.  Sats 96% on 6 lit. Functional quadriplegia, OSA-non compliant. Obese. HR 90's. SBP 126. Appears comfortable. Do follow simple commands. Off of pressor from AM  Pneumonia.  A fib, prolonged qtc  per notes  eICU Interventions  Get a stat ABG, call RT to suction. Duoneb prn ordered. Trial of BiPAP if can tolerate with asp precautions, NPO except meds via tube. Low thresh hold to intubate if encephalopathy worsens. No fever.     Intervention Category Intermediate Interventions: Respiratory distress - evaluation and management  Elmer Sow 09/01/2022, 8:04 PM  20:47 ABG: pco2 58. Resp acidosis, acute on chronic from her OSA/? OHS. - hx of OSA, obese non compliance with [place] at home, s/p extubation in noon, has NG tube. no feeding. AAOx1, very weak cough. functional quadriplegia. ABG stat showing pco2 59. RT says not a candidate for BiPAP.   Notified bed side CCM Dr Elmyra Ricks notified for evaluation at bed side.   21:36 Dr Elmyra Ricks, bed side CCM advice after review is to follow ABG at 23:00. Deep nasotracheal suctioning done by RT.   23:20 ABG worsening, bed side CCM , intubating her.

## 2022-09-01 NOTE — Progress Notes (Signed)
Patient was intubated due to respiratory/airway compromise and hypercarbic respiratory failure. BBS to auscultation post intubation noted per PA at bedside. ETT was visualized going through the cords per Video Laryngoscopy. MD performed procedure. CXR pending.

## 2022-09-01 NOTE — Progress Notes (Signed)
Doing well post extubation.  Weak voice.  NG tube in place.  [name], DO 09/01/22 5:50 PM [facility]

## 2022-09-01 NOTE — Progress Notes (Addendum)
Nasopharyngeal Airway inserted into right nare. NG tube in the left nare. Airway inserted per MD order. Critical ABG results given to RN. Airway compromise is evident. Patient had no cough or gag reflex upon insertion of NPA.

## 2022-09-01 NOTE — Procedures (Signed)
Extubation Procedure Note  Patient Details:   Name: Becky Hendricks DOB: 1946/04/29 MRN: [id number]   Airway Documentation:    Vent end date: 09/01/22 Vent end time: 1632   Evaluation  O2 sats: stable throughout Complications: No apparent complications Patient did tolerate procedure well. Bilateral Breath Sounds: Clear   Yes,pt able to cough to clear secretions and hoarsely vocalize name. Placed on 3L humidified nasal cannula and tolerating well at this time  Virgilio Frees 09/01/2022, 4:35 PM

## 2022-09-01 NOTE — Progress Notes (Signed)
NAME:  Becky Hendricks, MRN:  [id number], DOB:  [birth year], LOS: 2 ADMISSION DATE:  08/30/2022, CONSULTATION DATE: 08/30/2022 REFERRING MD: Emergency department physician, CHIEF COMPLAINT: Altered mental status, acute respiratory distress presumed sepsis  History of Present Illness:  77 year old female who was brought in by emergency medical services from skilled nursing facility unresponsive on nonrebreather.  She required urgent intubation by the emergency department physician and was considered to be septic and treated with fluid resuscitation and empirical antimicrobial therapy.  Pulmonary critical care has been asked to evaluate into the minute.  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  Nurses report no skin breakdown to the posterior aspect of her body.  She does have contractures of her right.  She does not follow commands.  She is currently being treated with broad-spectrum antibiotics fluid resuscitation and admission to the intensive care unit.  Pertinent  Medical History   Past Medical History:  Diagnosis Date   Atrial fibrillation (HCC)    Bipolar disorder (HCC)    Bipolar disorder (HCC) 04/27/2020   Clostridium difficile diarrhea 08/02/2019   Dehydration    Essential hypertension    Fall 02/11/2020   Morbid obesity (HCC)    Osteomyelitis (HCC) 12/03/2019   Pneumonia due to COVID-19 virus 09/15/2019   Type 2 diabetes mellitus (HCC)    Weakness      Significant Hospital Events: Including procedures, antibiotic start and stop dates in addition to other pertinent events   08/30/2022 intubated  Interim History / Subjective:  No overnight events  Objective   Blood pressure (!) 91/50, pulse (!) 57, temperature 100 F (37.8 C), resp. rate (!) 24, height 5\' 6"  (1.676 m), weight 99.6 kg, SpO2 96 %.    Vent Mode: PRVC FiO2 (%):  [30 %-50 %] 30 % Set Rate:  [24 bmp-28 bmp] 24 bmp Vt Set:  [470 mL] 470 mL PEEP:  [5 cmH20] 5 cmH20 Plateau Pressure:   [18 cmH20-21 cmH20] 18 cmH20   Intake/Output Summary (Last 24 hours) at 09/01/2022 1000 Last data filed at 09/01/2022 0800 Gross per 24 hour  Intake 2735.31 ml  Output 1250 ml  Net 1485.31 ml    Filed Weights   08/31/22 0349  Weight: 99.6 kg    Examination: General: Critically ill-appearing woman lying in bed in no acute distress, lightly sedated on mechanical ventilation HENT: [place]/AT, eyes anicteric, tracheal tube in place with bite-block Lungs: Breathing comfortably on mechanical ventilation, apneic on SBT again.  CTAB. Cardiovascular: S1S2, regular rate and rhythm Abdomen: Obese, soft, nontender Extremities: No clubbing or cyanosis, no significant edema.  Right hand splint in place.  Plantar ankle contractures Neuro: RASS 0, lifting both arms off the bed on command, not wiggling her toes on command, but will lift both legs from hips on command to move her lower extremities.  Tracking with her eyes.  CXR personally reviewed> RLL pneumonia. ETT in appropriate position. Blood culture>pending Urine culture> pending  Sodium 136 Potassium 3.8 BUN 19 Creatinine 0.66 Magnesium 2.2 Phosphorus 2.0  C diff negative Respiratory culture-moderate WBC, few GPC, rare GNR  Resolved Hospital Problem list     Assessment & Plan:  Ventilator dependent acute respiratory failure with hypoxia & hypercapnia due to influenza A pneumonia H/o OSA with bipap noncompliance -Low tidal volume ventilation - VAP prevention protocol - PAD protocol for sedation - Daily SAT and SBT as appropriate.  Needs for sedation weaned to tolerate SBT. - Continue Tamiflu, ceftriaxone, doxycycline  Septic  shock, hypothermia due to RLL pneumonia -Continue antibiotics - Follow cultures -Vasopressors as needed to maintain MAP greater than 65  Acute metabolic encephalopathy, improved.  No evidence of seizures on EEG. -Supportive care - PAD protocol  History of atrial fibrillation -Monitor on telemetry -  Lovenox while in the ICU due to potential need for procedures; can switch back to [place] when stable -Can resume PTA Coreg when not needing pressors  H/o Bipolar disorder -Continue PTA Risperdal -Can resume PTA Depakote as needed  At risk for refeeding syndrome hypophosphatemia - Repleted Phos - Continue to monitor labs -Advance tube feeds per RD recommendations -Thiamine  Acute on chronic anemia - Transfuse for hemoglobin less than 7 or hemodynamically significant bleeding  H/o pelvic OM Functional quadriplegia> my exams do not corroborate this -Minimize sedation as able - Unsure what her potential is for rehabilitation, defer PT and OT until extubated  Diarrhea, h/o c diff> negative this admission -Minimize antibiotics were able due to history of C. difficile  H/o DM2, uncontrolled hyperglycemia -Add glargine 5 units daily - Sliding scale insulin as needed -Hold PTA Jardiance -goal BG 140-180  H/o HTN -Hold PTA antihypertensives while on pressors  Prolonged Qtc, but EKG demonstrated TW in first half of R-R interval--proved since previous EKG -Avoid QTc prolonging meds -switched azithromcycin to doxycycline Continue to monitor  Myoclonus; concern for seizure activity today with whole body shaking and staring off in space after initially being more awake -Monitor for additional episodes  PTA on tamsulosin-unclear reason - Can resume when she can take oral meds  Best Practice (right click and "Reselect all SmartList Selections" daily)   Diet/type: tubefeeds DVT prophylaxis: systemic dose LMWH GI prophylaxis: PPI Lines: N/A Foley:  N/A Code Status:  full code Last date of multidisciplinary goals of care discussion [1/19- full code per her request of her brother [name]] Labs   CBC: Recent Labs  Lab 08/30/22 0743 08/30/22 0744 08/30/22 0848 08/31/22 0748 08/31/22 1408  WBC 16.0*  --   --  12.4*  --   NEUTROABS 11.0*  --   --   --   --   HGB 10.7*  12.2 9.9* 8.5* 8.5*  HCT 36.7 36.0 29.0* 28.3* 25.0*  MCV 85.7  --   --  82.7  --   PLT 198  --   --  143*  --      Basic Metabolic Panel: Recent Labs  Lab 08/30/22 0743 08/30/22 0744 08/30/22 0848 08/31/22 0748 08/31/22 1408 09/01/22 0623  NA 135 135 134* 138 136 136  K 4.5 4.5 3.8 3.2* 3.4* 3.8  CL 92*  --   --  102  --  103  CO2 30  --   --  26  --  25  GLUCOSE 193*  --   --  129*  --  183*  BUN 18  --   --  19  --  19  CREATININE 0.88  --   --  0.73  --  0.66  CALCIUM 8.6*  --   --  7.6*  --  7.2*  MG  --   --   --  1.6*  --  2.2  PHOS  --   --   --  2.1*  --  2.0*     This patient is critically ill with multiple organ system failure which requires frequent high complexity decision making, assessment, support, evaluation, and titration of therapies. This was completed through the application of advanced monitoring  technologies and extensive interpretation of multiple databases. During this encounter critical care time was devoted to patient care services described in this note for 34 minutes.  [name], DO 09/01/22 11:38 AM [facility]  For contact information, see Amion. If no response to pager, please call PCCM consult pager. After hours, 7PM- 7AM, please call Elink.

## 2022-09-02 DIAGNOSIS — J9601 Acute respiratory failure with hypoxia: Secondary | ICD-10-CM | POA: Diagnosis not present

## 2022-09-02 DIAGNOSIS — J189 Pneumonia, unspecified organism: Secondary | ICD-10-CM

## 2022-09-02 DIAGNOSIS — Z9911 Dependence on respirator [ventilator] status: Secondary | ICD-10-CM | POA: Diagnosis not present

## 2022-09-02 DIAGNOSIS — R4182 Altered mental status, unspecified: Secondary | ICD-10-CM | POA: Diagnosis not present

## 2022-09-02 LAB — POCT I-STAT 7, (LYTES, BLD GAS, ICA,H+H)
Acid-Base Excess: 3 mmol/L — ABNORMAL HIGH (ref 0.0–2.0)
Bicarbonate: 28 mmol/L (ref 20.0–28.0)
Calcium, Ion: 1.08 mmol/L — ABNORMAL LOW (ref 1.15–1.40)
HCT: 26 % — ABNORMAL LOW (ref 36.0–46.0)
Hemoglobin: 8.8 g/dL — ABNORMAL LOW (ref 12.0–15.0)
O2 Saturation: 93 %
Patient temperature: 98.4
Potassium: 4.3 mmol/L (ref 3.5–5.1)
Sodium: 138 mmol/L (ref 135–145)
TCO2: 29 mmol/L (ref 22–32)
pCO2 arterial: 43.4 mmHg (ref 32–48)
pH, Arterial: 7.417 (ref 7.35–7.45)
pO2, Arterial: 66 mmHg — ABNORMAL LOW (ref 83–108)

## 2022-09-02 LAB — GLUCOSE, CAPILLARY
Glucose-Capillary: 108 mg/dL — ABNORMAL HIGH (ref 70–99)
Glucose-Capillary: 121 mg/dL — ABNORMAL HIGH (ref 70–99)
Glucose-Capillary: 138 mg/dL — ABNORMAL HIGH (ref 70–99)
Glucose-Capillary: 146 mg/dL — ABNORMAL HIGH (ref 70–99)
Glucose-Capillary: 205 mg/dL — ABNORMAL HIGH (ref 70–99)
Glucose-Capillary: 229 mg/dL — ABNORMAL HIGH (ref 70–99)

## 2022-09-02 LAB — CBC
HCT: 26.6 % — ABNORMAL LOW (ref 36.0–46.0)
Hemoglobin: 8.1 g/dL — ABNORMAL LOW (ref 12.0–15.0)
MCH: 25.4 pg — ABNORMAL LOW (ref 26.0–34.0)
MCHC: 30.5 g/dL (ref 30.0–36.0)
MCV: 83.4 fL (ref 80.0–100.0)
Platelets: 149 10*3/uL — ABNORMAL LOW (ref 150–400)
RBC: 3.19 MIL/uL — ABNORMAL LOW (ref 3.87–5.11)
RDW: 17.9 % — ABNORMAL HIGH (ref 11.5–15.5)
WBC: 8.1 10*3/uL (ref 4.0–10.5)
nRBC: 0.4 % — ABNORMAL HIGH (ref 0.0–0.2)

## 2022-09-02 LAB — MAGNESIUM: Magnesium: 2.1 mg/dL (ref 1.7–2.4)

## 2022-09-02 LAB — CULTURE, RESPIRATORY W GRAM STAIN: Culture: NORMAL

## 2022-09-02 LAB — PHOSPHORUS: Phosphorus: 3.1 mg/dL (ref 2.5–4.6)

## 2022-09-02 LAB — BASIC METABOLIC PANEL
Anion gap: 7 (ref 5–15)
BUN: 16 mg/dL (ref 8–23)
CO2: 25 mmol/L (ref 22–32)
Calcium: 7.2 mg/dL — ABNORMAL LOW (ref 8.9–10.3)
Chloride: 102 mmol/L (ref 98–111)
Creatinine, Ser: 0.5 mg/dL (ref 0.44–1.00)
GFR, Estimated: >60 mL/min (ref >60)
Glucose, Bld: 118 mg/dL — ABNORMAL HIGH (ref 70–99)
Potassium: 4 mmol/L (ref 3.5–5.1)
Sodium: 134 mmol/L — ABNORMAL LOW (ref 135–145)

## 2022-09-02 MED ORDER — VITAL 1.5 CAL PO LIQD
1000.0000 mL | ORAL | Status: DC
  Administered 2022-09-03 – 2022-09-04 (×2): 1000 mL
  Filled 2022-09-02: qty 1000

## 2022-09-02 MED ORDER — VALPROIC ACID 250 MG/5ML PO SOLN
500.0000 mg | Freq: Two times a day (BID) | ORAL | Status: DC
  Administered 2022-09-02 – 2022-09-05 (×7): 500 mg
  Filled 2022-09-02 (×7): qty 10

## 2022-09-02 MED ORDER — INSULIN GLARGINE-YFGN 100 UNIT/ML ~~LOC~~ SOLN
8.0000 [IU] | Freq: Every day | SUBCUTANEOUS | Status: DC
  Administered 2022-09-03 – 2022-09-05 (×3): 8 [IU] via SUBCUTANEOUS
  Filled 2022-09-02 (×3): qty 0.08

## 2022-09-02 MED ORDER — SODIUM CHLORIDE 0.9 % IV BOLUS
500.0000 mL | Freq: Once | INTRAVENOUS | Status: AC
  Administered 2022-09-02: 500 mL via INTRAVENOUS

## 2022-09-02 MED ORDER — INSULIN ASPART 100 UNIT/ML IJ SOLN
0.0000 [IU] | INTRAMUSCULAR | Status: DC
  Administered 2022-09-02: 5 [IU] via SUBCUTANEOUS
  Administered 2022-09-03 (×4): 3 [IU] via SUBCUTANEOUS
  Administered 2022-09-03 – 2022-09-04 (×4): 5 [IU] via SUBCUTANEOUS
  Administered 2022-09-04 (×2): 3 [IU] via SUBCUTANEOUS
  Administered 2022-09-04: 5 [IU] via SUBCUTANEOUS
  Administered 2022-09-04: 8 [IU] via SUBCUTANEOUS
  Administered 2022-09-05 (×3): 5 [IU] via SUBCUTANEOUS
  Administered 2022-09-05 (×2): 3 [IU] via SUBCUTANEOUS

## 2022-09-02 NOTE — Progress Notes (Signed)
NAME:  Becky Hendricks, MRN:  [id number], DOB:  01-12-1946, LOS: 3 ADMISSION DATE:  08/30/2022, CONSULTATION DATE: 08/30/2022 REFERRING MD: Emergency department physician, CHIEF COMPLAINT: Altered mental status, acute respiratory distress presumed sepsis  History of Present Illness:  77 year old female who was brought in by emergency medical services from skilled nursing facility unresponsive on nonrebreather.  She required urgent intubation by the emergency department physician and was considered to be septic and treated with fluid resuscitation and empirical antimicrobial therapy.  Pulmonary critical care has been asked to evaluate into the minute.  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  Nurses report no skin breakdown to the posterior aspect of her body.  She does have contractures of her right.  She does not follow commands.  She is currently being treated with broad-spectrum antibiotics fluid resuscitation and admission to the intensive care unit.  Pertinent  Medical History   Past Medical History:  Diagnosis Date   Atrial fibrillation ([place])    Bipolar disorder ([place])    Bipolar disorder ([place]) 04/27/2020   Clostridium difficile diarrhea 08/02/2019   Dehydration    Essential hypertension    Fall 02/11/2020   Morbid obesity ([place])    Osteomyelitis ([place]) 12/03/2019   Pneumonia due to COVID-19 virus 09/15/2019   Type 2 diabetes mellitus ([place])    Weakness      Significant Hospital Events: Including procedures, antibiotic start and stop dates in addition to other pertinent events   08/30/2022 intubated 1/20 extubated, reintubated overnight  Interim History / Subjective:  Overnight required reintubation.  Friend from her skilled nursing facility at bedside today.  Reports that she is active in a wheelchair every day, but not able to walk.  Objective   Blood pressure (!) 125/57, pulse 66, temperature 100 F (37.8 C), resp. rate (!) 24, height 5\' 6"  (1.676  m), weight 99.1 kg, SpO2 97 %.    Vent Mode: PRVC FiO2 (%):  [40 %-60 %] 40 % Set Rate:  [24 bmp] 24 bmp Vt Set:  [470 mL] 470 mL PEEP:  [5 cmH20] 5 cmH20 Plateau Pressure:  [16 cmH20-20 cmH20] 18 cmH20   Intake/Output Summary (Last 24 hours) at 09/02/2022 1933 Last data filed at 09/02/2022 1900 Gross per 24 hour  Intake 1638.6 ml  Output 650 ml  Net 988.6 ml    Filed Weights   08/31/22 0349 09/02/22 0500  Weight: 99.6 kg 99.1 kg    Examination: General: Critically ill-appearing woman lying in bed no acute distress, intubated HENT: [place]/AT, eyes anicteric Lungs: Breathing comfortably on mechanical ventilation, CTAB Cardiovascular: S1-S2, bradycardic, regular rhythm Abdomen: Obese, soft, nontender Extremities: Edema, no cyanosis.  Plantar ankle contractures.  Small brace on right wrist. Neuro: RASS 0, lifting both arms off the bed on command, not wiggling her toes on command, but will lift both legs from hips on command to move her lower extremities.  Tracking with her eyes.  Urine culture> pending  Sodium 134 Potassium 4.0 BUN 16 Creatinine 0.5 Magnesium 2.1 Phosphorus 3.1  WBC 8.1 H/H 8.1/26.6 Platelets 149  Glucose 100-200  Respiratory culture-moderate WBC, few GPC, rare GNR> normal flora Cultures-no growth to date CXR personally reviewed-bibasilar atelectasis, possible right dependent effusion.  ET tube 2.5 cm above carina.  Resolved Hospital Problem list     Assessment & Plan:  Ventilator dependent acute respiratory failure with hypoxia & hypercapnia due to influenza A pneumonia H/o OSA with bipap noncompliance -Low tidal volume ventilation - VAP parenteral  protocol - Protocol for sedation - Daily SAT and SBT as appropriate-since she was reintubated overnight we will defer this today. - Continue Tamiflu, ceftriaxone, doxycycline.  Septic shock, hypothermia due to RLL pneumonia; shock resolved - Continue antibiotics as above - Follow cultures until  finalized - No longer requiring vasopressors.  Can resume if MAP less than 65  Acute metabolic encephalopathy, improved.  No evidence of seizures on EEG. -Supportive care - PAD protocol  History of atrial fibrillation -Monitor on telemetry - Lovenox following the ICU due to potential need for procedures.  Hold PTA DOAC for now. - Can resume PTA Coreg when blood pressure is increased.  Currently she is bradycardic and not requiring rate control  H/o Bipolar disorder -Continue PTA Resporal and Depakene  At risk for refeeding syndrome hypophosphatemia - Continue monitoring electrolytes - Tube feeds per RD recs - Thiamine  Acute on chronic anemia - Use for hemoglobin less than 7 or hemodynamically significant bleeding  H/o pelvic OM Functional quadriplegia> my exams do not corroborate this - Baseline is mobile with a wheelchair per her friend.  Does not walk due to ankle contractures, unsure what the underlying injury is. - PT, OT when able to participate  Diarrhea, h/o c diff> negative this admission -Minimize antibiotics were able due to history of C. difficile  H/o DM2, uncontrolled hyperglycemia - Increase glargine to 8 units daily - SSI as needed -Continue to hold PTA Jardiance - Goal blood glucose 140 and 180  H/o HTN Continue to hold PTA antihypertensives until blood pressure is increased  Prolonged Qtc, but EKG demonstrated TW in first half of R-R interval--proved since previous EKG -Avoid QTc prolonging meds - Doxycycline rather than azithromycin - Continue to monitor on telemetry  Myoclonus; concern for seizure activity today with whole body shaking and staring off in space after initially being more awake -Monitor for additional episodes; so far there have not been any  PTA on tamsulosin-unclear reason -Can resume tamsulosin when able to take p.o. meds  Best Practice (right click and "Reselect all SmartList Selections" daily)   Diet/type: tubefeeds DVT  prophylaxis: systemic dose LMWH GI prophylaxis: PPI Lines: N/A Foley:  N/A Code Status:  full code Last date of multidisciplinary goals of care discussion [1/19- full code per her request of her brother [name]] Labs   CBC: Recent Labs  Lab 08/30/22 0743 08/30/22 0744 08/31/22 0748 08/31/22 1408 09/01/22 2035 09/01/22 2221 09/02/22 0009 09/02/22 0335  WBC 16.0*  --  12.4*  --   --   --   --  8.1  NEUTROABS 11.0*  --   --   --   --   --   --   --   HGB 10.7*   < > 8.5* 8.5* 9.5* 9.9* 8.8* 8.1*  HCT 36.7   < > 28.3* 25.0* 28.0* 29.0* 26.0* 26.6*  MCV 85.7  --  82.7  --   --   --   --  83.4  PLT 198  --  143*  --   --   --   --  149*   < > = values in this interval not displayed.     Basic Metabolic Panel: Recent Labs  Lab 08/30/22 0743 08/30/22 0744 08/31/22 0748 08/31/22 1408 09/01/22 0623 09/01/22 2035 09/01/22 2221 09/02/22 0009 09/02/22 0335  NA 135   < > 138   < > 136 138 138 138 134*  K 4.5   < > 3.2*   < >  3.8 4.2 4.3 4.3 4.0  CL 92*  --  102  --  103  --   --   --  102  CO2 30  --  26  --  25  --   --   --  25  GLUCOSE 193*  --  129*  --  183*  --   --   --  118*  BUN 18  --  19  --  19  --   --   --  16  CREATININE 0.88  --  0.73  --  0.66  --   --   --  0.50  CALCIUM 8.6*  --  7.6*  --  7.2*  --   --   --  7.2*  MG  --   --  1.6*  --  2.2  --   --   --  2.1  PHOS  --   --  2.1*  --  2.0*  --   --   --  3.1   < > = values in this interval not displayed.     This patient is critically ill with multiple organ system failure which requires frequent high complexity decision making, assessment, support, evaluation, and titration of therapies. This was completed through the application of advanced monitoring technologies and extensive interpretation of multiple databases. During this encounter critical care time was devoted to patient care services described in this note for 36 minutes.  Steffanie Dunn, DO 09/02/22 7:41 PM [facility]  For contact information, see Amion. If no response to pager, please call PCCM consult pager. After hours, 7PM- 7AM, please call Elink.

## 2022-09-02 NOTE — Plan of Care (Signed)

## 2022-09-02 NOTE — Procedures (Signed)
Intubation Procedure Note  Becky Hendricks  [id number]  08-Aug-1946  Date:09/02/22  Time:12:01 AM   Provider Performing:Saina Waage Duwayne Heck    Procedure: Intubation (35456)  Indication(s) Respiratory Failure  Consent Unable to obtain consent due to emergent nature of procedure.   Anesthesia Etomidate, Fentanyl, and Rocuronium   Time Out Verified patient identification, verified procedure, site/side was marked, verified correct patient position, special equipment/implants available, medications/allergies/relevant history reviewed, required imaging and test results available.   Sterile Technique Usual hand hygeine, masks, and gloves were used   Procedure Description Patient positioned in bed supine.  Sedation given as noted above.  Patient was intubated with endotracheal tube using Glidescope.  View was Grade 1 full glottis .  Number of attempts was 1.  Colorimetric CO2 detector was consistent with tracheal placement.   Complications/Tolerance None; patient tolerated the procedure well. Chest X-ray is ordered to verify placement. Temporarily desatted to high 60s, rapidly back up to 90s within 30 seconds.     EBL none   Specimen(s) None

## 2022-09-02 NOTE — Progress Notes (Signed)
Nutrition Brief Note  RD received notice from pharmacy that hospital is out of stock of Osmolite 1.5. Unit does not have any floor stock. RD to adjust TF regimen. See full RD assessment note from 1/19.  Tube Feeds via NG-tube: -Vital 1.5 at 55 mL/hr (1320 mL/day) -60 mL ProSource TF20 - daily Provides 2060 kcal, 109 gm protein, and 1008 mL free water daily.    Hermina Barters RD, LDN Clinical Dietitian See Shea Evans for contact information.

## 2022-09-02 NOTE — Progress Notes (Signed)
eLink Physician-Brief Progress Note Patient Name: Becky Hendricks DOB: 25-Feb-1946 MRN: [id number]   Date of Service  09/02/2022  HPI/Events of Note  Notified of CBG >200 x2  eICU Interventions  Increased sliding scale coverage to moderate dose, q4h. Will continue to monitor glucose trends and make further changes as needed.         Bear Dance 09/02/2022, 8:55 PM

## 2022-09-03 ENCOUNTER — Inpatient Hospital Stay (HOSPITAL_COMMUNITY): Payer: Medicare Other

## 2022-09-03 DIAGNOSIS — J9601 Acute respiratory failure with hypoxia: Secondary | ICD-10-CM | POA: Diagnosis not present

## 2022-09-03 DIAGNOSIS — J189 Pneumonia, unspecified organism: Secondary | ICD-10-CM | POA: Diagnosis not present

## 2022-09-03 DIAGNOSIS — Z9911 Dependence on respirator [ventilator] status: Secondary | ICD-10-CM | POA: Diagnosis not present

## 2022-09-03 LAB — CBC
HCT: 26.4 % — ABNORMAL LOW (ref 36.0–46.0)
Hemoglobin: 7.5 g/dL — ABNORMAL LOW (ref 12.0–15.0)
MCH: 24.4 pg — ABNORMAL LOW (ref 26.0–34.0)
MCHC: 28.4 g/dL — ABNORMAL LOW (ref 30.0–36.0)
MCV: 85.7 fL (ref 80.0–100.0)
Platelets: 173 10*3/uL (ref 150–400)
RBC: 3.08 MIL/uL — ABNORMAL LOW (ref 3.87–5.11)
RDW: 18.3 % — ABNORMAL HIGH (ref 11.5–15.5)
WBC: 10 10*3/uL (ref 4.0–10.5)
nRBC: 0.3 % — ABNORMAL HIGH (ref 0.0–0.2)

## 2022-09-03 LAB — GLUCOSE, CAPILLARY
Glucose-Capillary: 177 mg/dL — ABNORMAL HIGH (ref 70–99)
Glucose-Capillary: 189 mg/dL — ABNORMAL HIGH (ref 70–99)
Glucose-Capillary: 190 mg/dL — ABNORMAL HIGH (ref 70–99)
Glucose-Capillary: 195 mg/dL — ABNORMAL HIGH (ref 70–99)
Glucose-Capillary: 199 mg/dL — ABNORMAL HIGH (ref 70–99)
Glucose-Capillary: 220 mg/dL — ABNORMAL HIGH (ref 70–99)
Glucose-Capillary: 237 mg/dL — ABNORMAL HIGH (ref 70–99)

## 2022-09-03 LAB — BASIC METABOLIC PANEL
Anion gap: 7 (ref 5–15)
BUN: 19 mg/dL (ref 8–23)
CO2: 25 mmol/L (ref 22–32)
Calcium: 7.3 mg/dL — ABNORMAL LOW (ref 8.9–10.3)
Chloride: 106 mmol/L (ref 98–111)
Creatinine, Ser: 0.59 mg/dL (ref 0.44–1.00)
GFR, Estimated: >60 mL/min (ref >60)
Glucose, Bld: 221 mg/dL — ABNORMAL HIGH (ref 70–99)
Potassium: 3.8 mmol/L (ref 3.5–5.1)
Sodium: 138 mmol/L (ref 135–145)

## 2022-09-03 LAB — MAGNESIUM: Magnesium: 2.1 mg/dL (ref 1.7–2.4)

## 2022-09-03 LAB — PHOSPHORUS: Phosphorus: 1.9 mg/dL — ABNORMAL LOW (ref 2.5–4.6)

## 2022-09-03 MED ORDER — TRANEXAMIC ACID FOR INHALATION
500.0000 mg | Freq: Once | RESPIRATORY_TRACT | Status: AC
  Administered 2022-09-03: 500 mg via RESPIRATORY_TRACT
  Filled 2022-09-03: qty 10

## 2022-09-03 MED ORDER — ENOXAPARIN SODIUM 60 MG/0.6ML IJ SOSY
50.0000 mg | PREFILLED_SYRINGE | INTRAMUSCULAR | Status: DC
  Administered 2022-09-04 – 2022-09-05 (×2): 50 mg via SUBCUTANEOUS
  Filled 2022-09-03 (×2): qty 0.6

## 2022-09-03 MED ORDER — DEXMEDETOMIDINE HCL IN NACL 400 MCG/100ML IV SOLN
0.0000 ug/kg/h | INTRAVENOUS | Status: DC
  Administered 2022-09-03 – 2022-09-04 (×4): 0.4 ug/kg/h via INTRAVENOUS
  Filled 2022-09-03 (×5): qty 100

## 2022-09-03 MED ORDER — POTASSIUM PHOSPHATES 15 MMOLE/5ML IV SOLN
30.0000 mmol | Freq: Once | INTRAVENOUS | Status: AC
  Administered 2022-09-03: 30 mmol via INTRAVENOUS
  Filled 2022-09-03: qty 10

## 2022-09-03 NOTE — Progress Notes (Signed)
OT Cancellation Note  Patient Details Name: Cynitha Berte MRN: [id number] DOB: Aug 19, 1945   Cancelled Treatment:    Reason Eval/Treat Not Completed: Medical issues which prohibited therapy Per RT note, pt back on full vent support w/ unstable vitals. Will hold OT attempts for eval and follow up tomorrow.   Layla Maw 09/03/2022, 9:49 AM

## 2022-09-03 NOTE — Progress Notes (Signed)
NAME:  Becky Hendricks, MRN:  [id number], DOB:  Feb 27, 1946, LOS: 4 ADMISSION DATE:  08/30/2022, CONSULTATION DATE: 08/30/2022 REFERRING MD: Emergency department physician, CHIEF COMPLAINT: Altered mental status, acute respiratory distress presumed sepsis  History of Present Illness:  77 year old female who was brought in by emergency medical services from skilled nursing facility unresponsive on nonrebreather.  She required urgent intubation by the emergency department physician and was considered to be septic and treated with fluid resuscitation and empirical antimicrobial therapy.  Pulmonary critical care has been asked to evaluate into the minute.  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  Nurses report no skin breakdown to the posterior aspect of her body.  She does have contractures of her right.  She does not follow commands.  She is currently being treated with broad-spectrum antibiotics fluid resuscitation and admission to the intensive care unit.  Pertinent  Medical History   Past Medical History:  Diagnosis Date   Atrial fibrillation (HCC)    Bipolar disorder (HCC)    Bipolar disorder (HCC) 04/27/2020   Clostridium difficile diarrhea 08/02/2019   Dehydration    Essential hypertension    Fall 02/11/2020   Morbid obesity (HCC)    Osteomyelitis (HCC) 12/03/2019   Pneumonia due to COVID-19 virus 09/15/2019   Type 2 diabetes mellitus (HCC)    Weakness      Significant Hospital Events: Including procedures, antibiotic start and stop dates in addition to other pertinent events   08/30/2022 intubated 1/20 extubated, reintubated overnight  Interim History / Subjective:  Agitated this morning, starting to have bright red blood with suctioning from endotracheal tube.  She denies complaints.  Tmax 99.7.  Objective   Blood pressure (!) 117/91, pulse 80, temperature 99.7 F (37.6 C), temperature source Esophageal, resp. rate (!) 29, height 5\' 6"  (1.676 m),  weight 99.1 kg, SpO2 97 %.    Vent Mode: PRVC FiO2 (%):  [40 %-55 %] 55 % Set Rate:  [24 bmp] 24 bmp Vt Set:  [470 mL] 470 mL PEEP:  [5 cmH20] 5 cmH20 Pressure Support:  [8 cmH20] 8 cmH20 Plateau Pressure:  [16 cmH20-20 cmH20] 20 cmH20   Intake/Output Summary (Last 24 hours) at 09/03/2022 1015 Last data filed at 09/03/2022 0800 Gross per 24 hour  Intake 1532.35 ml  Output 1118 ml  Net 414.35 ml    Filed Weights   08/31/22 0349 09/02/22 0500 09/03/22 0406  Weight: 99.6 kg 99.1 kg 99.1 kg    Examination: General: Critically ill-appearing woman lying in bed intubated, agitated HENT: [place]/AT, eyes anicteric, endotracheal tube in place Lungs: Minimal rhonchi, mild thin bloody secretions from endotracheal tube.  Tachypnea. Cardiovascular: S1-S2, tachycardic, regular rhythm Abdomen: Obese, soft, nontender Extremities: Wrist brace on her right, plantar contractures.  Mild edema. Neuro: RASS +1, tracking with her eyes, nodding to answer some questions.  Lifting her legs up and not following commands as regularly today.   Urine culture> pending  Sodium 138 Potassium 3.8 BUN 19 Creatinine 0.59 Magnesium 2.1 Phosphorus 1.9  WBC 10  H/H 7.5/26.4 Platelets 173  Glucose 100-230s  Respiratory culture-moderate WBC, few GPC, rare GNR> normal flora Blood cultures- NG x 4 days   Resolved Hospital Problem list     Assessment & Plan:  Ventilator dependent acute respiratory failure with hypoxia & hypercapnia due to influenza A pneumonia H/o OSA with bipap noncompliance Hemoptysis, most likely due to suction trauma - LTVV - VAP prevention protocol - PAD protocol for sedation -  Daily SAT and SBT as appropriate.  Failed today due to tachypnea and low volumes. - Continue Tamiflu, ceftriaxone, doxycycline. - With hemoptysis, 1 dose of inhaled TXA today.  Decrease Lovenox to DVT prophylaxis dosing for now.  Monitor for bleeding.  Minimize suctioning as able  Septic shock, hypothermia  due to RLL pneumonia; shock resolved - Ceftriaxone and doxycycline to complete 7 days - Tamiflu - Follow blood cultures until finalized  Acute metabolic encephalopathy, improved.  No evidence of seizures on EEG. - Supportive care - PAD protocol  History of paroxysmal atrial fibrillation - Monitor on telemetry - Due to hemoptysis, decrease Lovenox from full dosing to prophylactic today.  Hold PTA DOAC for now due to potential need for ICU procedures. -Can restart Precedex.  If needing significant rate control can use metoprolol until blood pressure improves when we can resume Coreg.  H/o Bipolar disorder -Con't PTA risperdal and depakene  At risk for refeeding syndrome hypophosphatemia - Phos repletion - Continue monitoring electrolytes - Tube feeds per RD recommendations - Vitamins  Acute on chronic anemia - Transfuse for hemoglobin less than 7 or hemodynamically significant bleeding; not currently needing - Decrease Lovenox dose to DVT prophylaxis today  H/o pelvic OM Functional quadriplegia> my exams do not corroborate this.  She is wheelchair-bound at baseline per her friend due to unknown medical condition, but is interactive and active in her wheelchair - PT, OT when able to participate  Diarrhea, h/o c diff> negative this admission -Minimize antibiotics were able due to history of C. Difficile; switched azithromycin to Doxy.  H/o DM2, uncontrolled hyperglycemia -Continue glargine 8 units daily - SSI as needed -Pharmacy blood glucose monitoring & management  protocol -Continue to hold PTA Jardiance  H/o HTN; currently normotensive -Continue to hold PTA Coreg  Prolonged Qtc, but EKG demonstrated TW in first half of R-R interval--proved since previous EKG - Avoid QTc prolonging meds - Doxy rather than azithromycin - Continue telemetry monitoring  Myoclonus; concern for seizure activity today with whole body shaking and staring off in space after initially being  more awake.  In retrospect, suspect this may have been agitation related -Monitor for additional episodes; so far there have not been any.    PTA on tamsulosin-unclear reason -Can resume tamsulosin when able to take p.o. meds  Brother Kharter Sestak updated today. Reports she has been on the vent for pneumonia and UTIs several times before, has had a cardiac arrest in the past. She has had a complicated 3 years, and the past year has been better, especially the past 2 months. He is very hopeful she will make a full recovery, and he wants to continue aggressive care measures. He lives in [place].  Best Practice (right click and "Reselect all SmartList Selections" daily)   Diet/type: tubefeeds DVT prophylaxis: LMWH GI prophylaxis: PPI Lines: N/A Foley:  Yes, and it is no longer needed and removal ordered  Code Status:  full code Last date of multidisciplinary goals of care discussion [1/22 full code, con't aggressive care per her request of her brother [name]] Labs   CBC: Recent Labs  Lab 08/30/22 0743 08/30/22 0744 08/31/22 0748 08/31/22 1408 09/01/22 2035 09/01/22 2221 09/02/22 0009 09/02/22 0335 09/03/22 0254  WBC 16.0*  --  12.4*  --   --   --   --  8.1 10.0  NEUTROABS 11.0*  --   --   --   --   --   --   --   --  HGB 10.7*   < > 8.5*   < > 9.5* 9.9* 8.8* 8.1* 7.5*  HCT 36.7   < > 28.3*   < > 28.0* 29.0* 26.0* 26.6* 26.4*  MCV 85.7  --  82.7  --   --   --   --  83.4 85.7  PLT 198  --  143*  --   --   --   --  149* 173   < > = values in this interval not displayed.     Basic Metabolic Panel: Recent Labs  Lab 08/30/22 0743 08/30/22 0744 08/31/22 0748 08/31/22 1408 09/01/22 0623 09/01/22 2035 09/01/22 2221 09/02/22 0009 09/02/22 0335 09/03/22 0254  NA 135   < > 138   < > 136 138 138 138 134* 138  K 4.5   < > 3.2*   < > 3.8 4.2 4.3 4.3 4.0 3.8  CL 92*  --  102  --  103  --   --   --  102 106  CO2 30  --  26  --  25  --   --   --  25 25  GLUCOSE 193*  --   129*  --  183*  --   --   --  118* 221*  BUN 18  --  19  --  19  --   --   --  16 19  CREATININE 0.88  --  0.73  --  0.66  --   --   --  0.50 0.59  CALCIUM 8.6*  --  7.6*  --  7.2*  --   --   --  7.2* 7.3*  MG  --   --  1.6*  --  2.2  --   --   --  2.1 2.1  PHOS  --   --  2.1*  --  2.0*  --   --   --  3.1 1.9*   < > = values in this interval not displayed.     This patient is critically ill with multiple organ system failure which requires frequent high complexity decision making, assessment, support, evaluation, and titration of therapies. This was completed through the application of advanced monitoring technologies and extensive interpretation of multiple databases. During this encounter critical care time was devoted to patient care services described in this note for 40 minutes.  [name], DO 09/03/22 10:34 AM [facility]  For contact information, see Amion. If no response to pager, please call PCCM consult pager. After hours, 7PM- 7AM, please call Elink.

## 2022-09-03 NOTE — Progress Notes (Signed)
PT Cancellation Note  Patient Details Name: Becky Hendricks MRN: [id number] DOB: August 17, 1945   Cancelled Treatment:    Reason Eval/Treat Not Completed: PT screened, no needs identified, will sign off (Noted from pts chart that she has been at a SNF for some time.  Per Ginger Carne, PT's note from 04/22/21, pt was total care and the SNF was using a mechanical lift prior to that admit therefore pt is not appropriate for PT.  Will sign off.)   [name] 09/03/2022, 11:20 AM Natisha Trzcinski M,PT Acute Rehab Services [phone]

## 2022-09-03 NOTE — Progress Notes (Signed)
[facility] ADULT ICU REPLACEMENT PROTOCOL   The patient does apply for the [facility] Adult ICU Electrolyte Replacment Protocol based on the criteria listed below:   1.Exclusion criteria: TCTS, ECMO, Dialysis, and Myasthenia Gravis patients 2. Is GFR >/= 30 ml/min? Yes.    Patient's GFR today is >60 3. Is SCr </= 2? Yes.   Patient's SCr is 0.59 mg/dL 4. Did SCr increase >/= 0.5 in 24 hours? No. 5.Pt's weight >40kg  Yes.   6. Abnormal electrolyte(s): Phos  7. Electrolytes replaced per protocol 8.  Call MD STAT for K+ </= 2.5, Phos </= 1, or Mag </= 1 Physician:  Joette Catching Isiaah Cuervo 09/03/2022 4:55 AM

## 2022-09-03 NOTE — Progress Notes (Signed)
Placed back on full support due to agitation, desaturation and increased RR. RT will attempt to wean pt later if tolerated

## 2022-09-03 NOTE — Progress Notes (Addendum)
Pharmacy ICU Insulin Management  CBGs above goal 140-180 mg/dL: Yes  Current regimen (include units of SSI in last 24hr): Insulin glargine 5 units daily, insulin aspart 1-3 units q4h (used 16 units in 24 hours)  Medications affecting CBG levels:   Plan: CCM provider has already increased insulin glargine 8 units daily CCM provider has changed insulin aspart to mSSI q4h Continue to monitor blood glucose

## 2022-09-03 NOTE — Progress Notes (Signed)
Initial Nutrition Assessment  DOCUMENTATION CODES:  Obesity unspecified  INTERVENTION:  Restart TF via cortrak tube once placement confirmed: Vital 1.5 at 55 ml/h (1320 ml per day) Prosource TF20 60 ml 1x/d Provides 2060 kcal, 109 gm protein, 1008 ml free water daily Thiamine 100mg  x 5 days for possible refeeding  NUTRITION DIAGNOSIS:  Inadequate oral intake related to inability to eat as evidenced by NPO status.  GOAL:  Patient will meet greater than or equal to 90% of their needs  MONITOR:  Vent status, Labs, Weight trends, TF tolerance  REASON FOR ASSESSMENT:  Consult Enteral/tube feeding initiation and management (trickle)  ASSESSMENT:  Pt with hx of HTN, DM type 2, atrial fibrillation, functional quadriplegia, and CHF presented to ED from SNF with AMS and hypoxia. Found to be flu+ and required intubation in ED.  1/18 - intubated 1/20 - extubated, reintubated 1/22 - cortrak to be placed today  Pt resting in bed at the time of assessment. Briefly extubated over the weekend but required re-intubation ~ 8 hours later. NGT in place at this time to be replaced by cortrak tube. TF currently infusing at goal and tolerating well. Formula was adjusted over the weekend due to supply chain issues. Phosphorus still trending low, RPH is replacing as needed.   Noted elevated glucose. Current formula is lower in carbs than previous. If pt remains metabolically stable could consider transitioning to Glucerna 1.5.  MV: 11.5 L/min Temp (24hrs), Avg:99.2 F (37.3 C), Min:98.2 F (36.8 C), Max:100 F (37.8 C)   Intake/Output Summary (Last 24 hours) at 09/03/2022 1334 Last data filed at 09/03/2022 1000 Gross per 24 hour  Intake 1878.55 ml  Output 1048 ml  Net 830.55 ml  Net IO Since Admission: 5,885.8 mL [09/03/22 1334]   Nutritionally Relevant Medications: Scheduled Meds:  atorvastatin  10 mg Per Tube QHS   docusate  100 mg Per Tube BID   doxycycline  100 mg Per Tube Q12H    famotidine  20 mg Per Tube BID   PROSource TF20  60 mL Per Tube Daily   insulin aspart  0-15 Units Subcutaneous Q4H   insulin glargine-yfgn  8 Units Subcutaneous Daily   polyethylene glycol  17 g Per Tube Daily   thiamine  100 mg Per Tube Daily   Continuous Infusions:  dexmedetomidine (PRECEDEX) IV infusion 0.4 mcg/kg/hr (09/03/22 1000)   feeding supplement (VITAL 1.5 CAL) 55 mL/hr at 09/03/22 1000   fentaNYL infusion INTRAVENOUS 25 mcg/hr (09/03/22 1000)   Labs Reviewed: Phosphorus 1.9 CBG ranges from 138-237 mg/dL over the last 24 hours   NUTRITION - FOCUSED PHYSICAL EXAM: Flowsheet Row Most Recent Value  Orbital Region No depletion  Upper Arm Region No depletion  Thoracic and Lumbar Region No depletion  Buccal Region No depletion  Temple Region No depletion  Clavicle Bone Region No depletion  Clavicle and Acromion Bone Region No depletion  Scapular Bone Region No depletion  Dorsal Hand No depletion  Patellar Region No depletion  Anterior Thigh Region No depletion  Posterior Calf Region No depletion  Edema (RD Assessment) Moderate  [pitting edema to the BLE]  Hair Reviewed  Eyes Reviewed  Mouth Reviewed  Skin Reviewed  Nails Reviewed   Diet Order:   Diet Order             Diet NPO time specified  Diet effective now                   EDUCATION NEEDS:  Not  appropriate for education at this time  Skin:  Skin Assessment: Reviewed RN Assessment  Last BM:  1/20  Height:  Ht Readings from Last 1 Encounters:  08/30/22 5\' 6"  (1.676 m)    Weight:  Wt Readings from Last 1 Encounters:  09/03/22 99.1 kg    Ideal Body Weight:  59.1 kg  BMI:  Body mass index is 35.26 kg/m.  Estimated Nutritional Needs:  Kcal:  1800-2000 kcal/d Protein:  100-115g/d Fluid:  2-2.2L/d    Ranell Patrick, RD, LDN Clinical Dietitian RD pager # available in [facility]  After hours/weekend pager # available in [facility]

## 2022-09-03 NOTE — Procedures (Signed)
Cortrak  Person Inserting Tube:  Alroy Dust, Jayel Scaduto L, RD Tube Type:  Cortrak - 43 inches Tube Size:  10 Tube Location:  Right nare Secured by: Bridle Technique Used to Measure Tube Placement:  Marking at nare/corner of mouth Cortrak Secured At:  70 cm   Cortrak Tube Team Note:  Consult received to place a Cortrak feeding tube.   X-ray is required, abdominal x-ray has been ordered by the Cortrak team. Please confirm tube placement before using the Cortrak tube.   If the tube becomes dislodged please keep the tube and contact the Cortrak team at [email or web address] for replacement.  If after hours and replacement cannot be delayed, place a NG tube and confirm placement with an abdominal x-ray.    Hermina Barters RD, LDN Clinical Dietitian See Shea Evans for contact information.

## 2022-09-03 NOTE — Progress Notes (Signed)
Transition of Care Department [facility]) following patient for high risk of readmission.   Patient admitted with Septic shock from community acquired pneumonia with influenza and presumed bacterial superinfection. Patient currently intubated.  Transition of Care Department [facility]) has reviewed patient and we will continue to monitor patient advancement through interdisciplinary progression rounds.

## 2022-09-03 NOTE — IPAL (Signed)
  Interdisciplinary Goals of Care Family Meeting   Date carried out:: 09/03/2022  Location of the meeting: Phone conference  Member's involved: Physician and Family Member or next of kin  Durable Power of Attorney or acting medical decision maker: [name]    Discussion: We discussed goals of care for Becky Hendricks .   [name] updated today. Reports she has been on the vent for pneumonia and UTIs several times before, and she has had a cardiac arrest in the past. She had severe bedsore. "She has been through hell in the past 3 years". She has had a complicated 3 years, and the past year has been better, especially the past 2 months-- she has been more motivated recently to work with therapy.  He is very hopeful she will make a full recovery, and he wants to continue aggressive care measures. He lives in [place].  Code status: Full Code  Disposition: Continue current acute care   Time spent for the meeting: 10 min.  Becky Hendricks 09/03/2022, 10:35 AM

## 2022-09-04 DIAGNOSIS — J9601 Acute respiratory failure with hypoxia: Secondary | ICD-10-CM | POA: Diagnosis not present

## 2022-09-04 LAB — MAGNESIUM: Magnesium: 2.2 mg/dL (ref 1.7–2.4)

## 2022-09-04 LAB — CBC
HCT: 27.5 % — ABNORMAL LOW (ref 36.0–46.0)
Hemoglobin: 8.4 g/dL — ABNORMAL LOW (ref 12.0–15.0)
MCH: 25.2 pg — ABNORMAL LOW (ref 26.0–34.0)
MCHC: 30.5 g/dL (ref 30.0–36.0)
MCV: 82.6 fL (ref 80.0–100.0)
Platelets: 195 10*3/uL (ref 150–400)
RBC: 3.33 MIL/uL — ABNORMAL LOW (ref 3.87–5.11)
RDW: 18.4 % — ABNORMAL HIGH (ref 11.5–15.5)
WBC: 9.2 10*3/uL (ref 4.0–10.5)
nRBC: 0.4 % — ABNORMAL HIGH (ref 0.0–0.2)

## 2022-09-04 LAB — GLUCOSE, CAPILLARY
Glucose-Capillary: 191 mg/dL — ABNORMAL HIGH (ref 70–99)
Glucose-Capillary: 206 mg/dL — ABNORMAL HIGH (ref 70–99)
Glucose-Capillary: 210 mg/dL — ABNORMAL HIGH (ref 70–99)
Glucose-Capillary: 221 mg/dL — ABNORMAL HIGH (ref 70–99)
Glucose-Capillary: 229 mg/dL — ABNORMAL HIGH (ref 70–99)
Glucose-Capillary: 253 mg/dL — ABNORMAL HIGH (ref 70–99)

## 2022-09-04 LAB — BASIC METABOLIC PANEL
Anion gap: 5 (ref 5–15)
BUN: 17 mg/dL (ref 8–23)
CO2: 27 mmol/L (ref 22–32)
Calcium: 7.6 mg/dL — ABNORMAL LOW (ref 8.9–10.3)
Chloride: 106 mmol/L (ref 98–111)
Creatinine, Ser: 0.59 mg/dL (ref 0.44–1.00)
GFR, Estimated: >60 mL/min (ref >60)
Glucose, Bld: 243 mg/dL — ABNORMAL HIGH (ref 70–99)
Potassium: 4.2 mmol/L (ref 3.5–5.1)
Sodium: 138 mmol/L (ref 135–145)

## 2022-09-04 LAB — LEGIONELLA PNEUMOPHILA SEROGP 1 UR AG: L. pneumophila Serogp 1 Ur Ag: NEGATIVE

## 2022-09-04 LAB — CULTURE, BLOOD (ROUTINE X 2)
Culture: NO GROWTH
Culture: NO GROWTH
Special Requests: ADEQUATE
Special Requests: ADEQUATE

## 2022-09-04 LAB — PHOSPHORUS: Phosphorus: 3.5 mg/dL (ref 2.5–4.6)

## 2022-09-04 MED ORDER — INSULIN ASPART 100 UNIT/ML IJ SOLN
2.0000 [IU] | INTRAMUSCULAR | Status: DC
  Administered 2022-09-04 – 2022-09-05 (×7): 2 [IU] via SUBCUTANEOUS

## 2022-09-04 MED ORDER — FUROSEMIDE 10 MG/ML IJ SOLN
40.0000 mg | Freq: Once | INTRAMUSCULAR | Status: AC
  Administered 2022-09-04: 40 mg via INTRAVENOUS
  Filled 2022-09-04: qty 4

## 2022-09-04 MED ORDER — ACETAMINOPHEN 325 MG PO TABS
650.0000 mg | ORAL_TABLET | Freq: Four times a day (QID) | ORAL | Status: DC
  Administered 2022-09-04 – 2022-09-05 (×5): 650 mg
  Filled 2022-09-04 (×5): qty 2

## 2022-09-04 NOTE — Progress Notes (Signed)
Pharmacy ICU Insulin Management   CBGs above goal 140-180 mg/dL: Yes   Current regimen (include units of SSI in last 24hr): - Insulin glargine 8 units daily - Insulin aspart mSSI q4h (used 20 units in last 24hr)  Medications affecting CBG levels:   Plan: Start Insulin aspart 2 units q4hr Continue Insulin glargine 8 units daily Continue Insulin aspart mSSI q4hr Continue to monitor blood glucose  [employer], Pharm.D PGY1 Pharmacy Resident 09/04/2022 6:56 AM

## 2022-09-04 NOTE — Progress Notes (Signed)
   NAME:  Becky Hendricks, MRN:  [id number], DOB:  [birth year], LOS: 5 ADMISSION DATE:  08/30/2022, CONSULTATION DATE:  1/18 REFERRING MD:  Ronnald Nian, CHIEF COMPLAINT:  confusion, dyspnea  History of Present Illness:  77 y/o female admitted on 1/18 in the context of acute hypoxemic respiratory failure and sepsis due to RLL pneumonia.  Found to be positive for Flu A.  Pertinent  Medical History  Atrial fibrillation Bipolar disorder C diff Hypertension Morbid obesity Osteomyelitis DM2 Severe physical deconditioning> wheelchair bound  Significant Hospital Events: Including procedures, antibiotic start and stop dates in addition to other pertinent events   08/30/2022 intubated, blood cultures negative, Flu A positive 1/29 trach aspirate culture OPF, c. Diff negative 1/20 extubated, reintubated overnight  Interim History / Subjective:  No hemoptysis Fentanyl weaned off   Objective   Blood pressure 124/64, pulse (!) 45, temperature 98.2 F (36.8 C), resp. rate (!) 24, height 5\' 6"  (1.676 m), weight 93.1 kg, SpO2 100 %.    Vent Mode: PRVC FiO2 (%):  [30 %-55 %] 30 % Set Rate:  [24 bmp] 24 bmp Vt Set:  [470 mL] 470 mL PEEP:  [5 cmH20] 5 cmH20 Plateau Pressure:  [19 cmH20-21 cmH20] 19 cmH20   Intake/Output Summary (Last 24 hours) at 09/04/2022 0945 Last data filed at 09/04/2022 0800 Gross per 24 hour  Intake 1944.25 ml  Output 1390 ml  Net 554.25 ml   Filed Weights   09/02/22 0500 09/03/22 0406 09/04/22 0412  Weight: 99.1 kg 99.1 kg 93.1 kg    Examination:  General:  In bed on vent HENT: NCAT ETT in place PULM: CTA B, vent supported breathing CV: RRR, no mgr GI: BS+, soft, nontender MSK: normal bulk and tone Neuro: sedated on vent   Resolved Hospital Problem list   Myoclonus> likely agitation  Assessment & Plan:  Acute respiratory failure with hypoxemia due to Flu A pneumonia OSA with BIPAP non-compliance Hemoptysis > none on 1/23 Full mechanical vent support VAP  prevention Daily WUA/SBT Lasix x1 dose today Attempt pressure support after weaning off precedex Monitor for bleeding  Septic shock> resolved RLL pneumonia Continue tamiflu, ceftriaxone/doxy for total of 7 days  Hemoptysis, favor suction trauma continue lovenox at prophylactic dosing for now Monitor for bleeding   History of atrial fibrillation  Now with sinus bradycaria Tele Minimize/d/c precedex Likely restart full anticoagulation on 1/24 if not bleeding  Need for sedation for mechanical ventilation Bipolar disorder Wean off precedex Check QTc interval with 12 lead EKG Risperdal to continue  Acute on chronic anemia  Monitor for bleeding Transfuse PRBC for Hgb < 7 gm/dL  DM2 with hyperglycemia Glargine/SSI to continue  Prolonged QTc > improved 12 lead EKG  Best Practice (right click and "Reselect all SmartList Selections" daily)   Diet/type: tubefeeds DVT prophylaxis: LMWH prophylactic dose GI prophylaxis: H2B Lines: N/A Foley:  N/A Code Status:  full code Last date of multidisciplinary goals of care discussion [1/22 with brother [name] and Dr. Carlis Abbott  Critical care time: 35 minutes    Roselie Awkward, MD [place] PCCM Pager: [phone] Cell: [phone] After 7:00 pm call Elink  [phone]

## 2022-09-04 NOTE — Progress Notes (Signed)
[facility] PCCM  Updated the patient's brother [name] by phone Questions answered  Roselie Awkward, MD [place] PCCM Pager: [phone] Cell: [phone] After 7:00 pm call Elink  [phone]

## 2022-09-05 DIAGNOSIS — J9601 Acute respiratory failure with hypoxia: Secondary | ICD-10-CM | POA: Diagnosis not present

## 2022-09-05 LAB — GLUCOSE, CAPILLARY
Glucose-Capillary: 100 mg/dL — ABNORMAL HIGH (ref 70–99)
Glucose-Capillary: 159 mg/dL — ABNORMAL HIGH (ref 70–99)
Glucose-Capillary: 163 mg/dL — ABNORMAL HIGH (ref 70–99)
Glucose-Capillary: 211 mg/dL — ABNORMAL HIGH (ref 70–99)
Glucose-Capillary: 219 mg/dL — ABNORMAL HIGH (ref 70–99)
Glucose-Capillary: 220 mg/dL — ABNORMAL HIGH (ref 70–99)

## 2022-09-05 LAB — CBC
HCT: 26.8 % — ABNORMAL LOW (ref 36.0–46.0)
Hemoglobin: 7.7 g/dL — ABNORMAL LOW (ref 12.0–15.0)
MCH: 24.1 pg — ABNORMAL LOW (ref 26.0–34.0)
MCHC: 28.7 g/dL — ABNORMAL LOW (ref 30.0–36.0)
MCV: 83.8 fL (ref 80.0–100.0)
Platelets: 226 10*3/uL (ref 150–400)
RBC: 3.2 MIL/uL — ABNORMAL LOW (ref 3.87–5.11)
RDW: 18.2 % — ABNORMAL HIGH (ref 11.5–15.5)
WBC: 8.8 10*3/uL (ref 4.0–10.5)
nRBC: 0.5 % — ABNORMAL HIGH (ref 0.0–0.2)

## 2022-09-05 LAB — BASIC METABOLIC PANEL
Anion gap: 10 (ref 5–15)
BUN: 21 mg/dL (ref 8–23)
CO2: 26 mmol/L (ref 22–32)
Calcium: 8 mg/dL — ABNORMAL LOW (ref 8.9–10.3)
Chloride: 103 mmol/L (ref 98–111)
Creatinine, Ser: 0.58 mg/dL (ref 0.44–1.00)
GFR, Estimated: >60 mL/min (ref >60)
Glucose, Bld: 221 mg/dL — ABNORMAL HIGH (ref 70–99)
Potassium: 4.2 mmol/L (ref 3.5–5.1)
Sodium: 139 mmol/L (ref 135–145)

## 2022-09-05 MED ORDER — APIXABAN 5 MG PO TABS: 5.0000 mg | ORAL_TABLET | Freq: Two times a day (BID) | ORAL | Status: DC

## 2022-09-05 MED ORDER — CARVEDILOL 3.125 MG PO TABS
3.1250 mg | ORAL_TABLET | Freq: Every day | ORAL | Status: DC
  Administered 2022-09-05 – 2022-09-07 (×3): 3.125 mg via ORAL
  Filled 2022-09-05 (×3): qty 1

## 2022-09-05 MED ORDER — INSULIN GLARGINE-YFGN 100 UNIT/ML ~~LOC~~ SOLN
12.0000 [IU] | Freq: Every day | SUBCUTANEOUS | Status: DC
  Filled 2022-09-05: qty 0.12

## 2022-09-05 MED ORDER — APIXABAN 5 MG PO TABS
5.0000 mg | ORAL_TABLET | Freq: Two times a day (BID) | ORAL | Status: DC
  Administered 2022-09-05 (×2): 5 mg
  Filled 2022-09-05 (×2): qty 1

## 2022-09-05 MED ORDER — INSULIN ASPART 100 UNIT/ML IJ SOLN
6.0000 [IU] | INTRAMUSCULAR | Status: DC
  Administered 2022-09-05 (×2): 6 [IU] via SUBCUTANEOUS

## 2022-09-05 MED ORDER — SPIRONOLACTONE 12.5 MG HALF TABLET
12.5000 mg | ORAL_TABLET | Freq: Every morning | ORAL | Status: DC
  Administered 2022-09-05 – 2022-09-08 (×4): 12.5 mg via ORAL
  Filled 2022-09-05 (×4): qty 1

## 2022-09-05 MED ORDER — FUROSEMIDE 10 MG/ML IJ SOLN
40.0000 mg | Freq: Once | INTRAMUSCULAR | Status: AC
  Administered 2022-09-05: 40 mg via INTRAVENOUS
  Filled 2022-09-05: qty 4

## 2022-09-05 MED ORDER — SENNA 8.6 MG PO TABS: 1.0000 | ORAL_TABLET | Freq: Every day | ORAL | Status: DC

## 2022-09-05 MED ORDER — SENNOSIDES 8.8 MG/5ML PO SYRP
5.0000 mL | ORAL_SOLUTION | Freq: Two times a day (BID) | ORAL | Status: DC
  Administered 2022-09-05 (×2): 5 mL
  Filled 2022-09-05 (×2): qty 5

## 2022-09-05 NOTE — Inpatient Diabetes Management (Addendum)
Inpatient Diabetes Program Recommendations  AACE/ADA: New Consensus Statement on Inpatient Glycemic Control   Target Ranges:  Prepandial:   less than 140 mg/dL      Peak postprandial:   less than 180 mg/dL (1-2 hours)      Critically ill patients:  140 - 180 mg/dL    Latest Reference Range & Units 09/05/22 03:37 09/05/22 08:09  Glucose-Capillary 70 - 99 mg/dL 211 (H) 220 (H)    Latest Reference Range & Units 09/04/22 07:57 09/04/22 11:51 09/04/22 15:46 09/04/22 19:35 09/04/22 23:31  Glucose-Capillary 70 - 99 mg/dL 206 (H) 229 (H) 253 (H) 221 (H) 210 (H)   Review of Glycemic Control  Diabetes history: DM2 Outpatient Diabetes medications: Jardiance 10 mg daily, Humalog 0-10 units Q4H Current orders for Inpatient glycemic control: Semglee 8 units daily, Novolog 2 units Q4H, Novolog 0-15 units Q4H  Inpatient Diabetes Program Recommendations:    Insulin: Please consider increasing Semglee to 12 units daily to start 09/06/22 (if agreeable, give Semglee 4 units x1 today since already given 8 units) and increasing tube feeding coverage to Novolog 6 units Q4H.  Thanks, Barnie Alderman, RN, MSN, [place] Diabetes Coordinator Inpatient Diabetes Program [phone] (Team Pager from 8am to [place])

## 2022-09-05 NOTE — Progress Notes (Signed)
Attending:    Subjective: Admitted 1/18 for flu A and RLL pneumonia Remains mechanically ventilated Received lasix yesterday, diuresed but remains net positive Precedex off, weaning off fentanyl   Objective: Vitals:   09/05/22 0440 09/05/22 0500 09/05/22 0600 09/05/22 0743  BP:  (!) 117/53 (!) 110/51   Pulse:  (!) 43 (!) 42   Resp:  (!) 24 (!) 24   Temp:      TempSrc:      SpO2:  99% 97% 96%  Weight: 98.9 kg     Height:       Vent Mode: CPAP;PSV FiO2 (%):  [30 %] 30 % Set Rate:  [24 bmp] 24 bmp Vt Set:  [470 mL] 470 mL PEEP:  [5 cmH20] 5 cmH20 Pressure Support:  [12 cmH20] 12 cmH20 Plateau Pressure:  [18 cmH20-19 cmH20] 19 cmH20  Intake/Output Summary (Last 24 hours) at 09/05/2022 0940 Last data filed at 09/05/2022 0600 Gross per 24 hour  Intake 1767.61 ml  Output 1975 ml  Net -207.39 ml    General:  In bed on vent HENT: NCAT ETT in place PULM: CTA B, vent supported breathing CV: RRR, no mgr GI: BS+, soft, nontender MSK: normal bulk and tone Neuro: awake on vent, following comands    CBC    Component Value Date/Time   WBC 8.8 09/05/2022 0105   RBC 3.20 (L) 09/05/2022 0105   HGB 7.7 (L) 09/05/2022 0105   HCT 26.8 (L) 09/05/2022 0105   PLT 226 09/05/2022 0105   MCV 83.8 09/05/2022 0105   MCH 24.1 (L) 09/05/2022 0105   MCHC 28.7 (L) 09/05/2022 0105   RDW 18.2 (H) 09/05/2022 0105   LYMPHSABS 2.2 08/30/2022 0743   MONOABS 2.5 (H) 08/30/2022 0743   EOSABS 0.0 08/30/2022 0743   BASOSABS 0.1 08/30/2022 0743    BMET    Component Value Date/Time   NA 139 09/05/2022 0105   K 4.2 09/05/2022 0105   CL 103 09/05/2022 0105   CO2 26 09/05/2022 0105   GLUCOSE 221 (H) 09/05/2022 0105   BUN 21 09/05/2022 0105   CREATININE 0.58 09/05/2022 0105   CREATININE 0.63 06/27/2020 0924   CALCIUM 8.0 (L) 09/05/2022 0105   GFRNONAA >60 09/05/2022 0105   GFRNONAA 90 06/27/2020 0924   GFRAA 104 06/27/2020 0924     Impression/Plan: Flu A and bacterial pneumonia>  ceftriaxone/doxy and tamiflu for 7 days Hemoptysis> stable, hasn't recurred, continue to monitor for bleeding Afib> OK to resume  Bipolar disorder> continue risperdol and depakote DM2 > SSI and glargine (increase glargine and tube feeding coverage today) Acute respiratory failure with hypoxemia, acute pulmonary edema> lasix again today, attempt extubation today  My cc time 35 minutes  Roselie Awkward, MD [facility] Pager: [phone] Cell: [phone] After 7pm: [phone]

## 2022-09-05 NOTE — Progress Notes (Signed)
NAME:  Becky Hendricks, MRN:  [id number], DOB:  [birth year], LOS: 6 ADMISSION DATE:  08/30/2022, CONSULTATION DATE:  1/18 REFERRING MD:  Ronnald Nian, CHIEF COMPLAINT:  confusion, dyspnea  History of Present Illness:  77 y/o female admitted on 1/18 in the context of acute hypoxemic respiratory failure and sepsis due to RLL pneumonia.  Found to be positive for Flu A.  Pertinent  Medical History  Atrial fibrillation Bipolar disorder C diff Hypertension Morbid obesity Osteomyelitis DM2 Severe physical deconditioning> wheelchair bound  Significant Hospital Events: Including procedures, antibiotic start and stop dates in addition to other pertinent events   08/30/2022 intubated, blood cultures negative, Flu A positive 1/29 trach aspirate culture OPF, c. Diff negative 1/20 extubated, reintubated overnight 1/24 Anxious/Weaning with minimal sedation, Antibiotics completed   Interim History / Subjective:  No hemoptysis overnight  Minimal Sedation    Objective   Blood pressure (!) 117/53, pulse (!) 43, temperature (!) 96.6 F (35.9 C), resp. rate (!) 24, height 5\' 6"  (1.676 m), weight 98.9 kg, SpO2 99 %.    Vent Mode: PRVC FiO2 (%):  [30 %-40 %] 30 % Set Rate:  [24 bmp] 24 bmp Vt Set:  [470 mL] 470 mL PEEP:  [5 cmH20] 5 cmH20 Plateau Pressure:  [18 cmH20-19 cmH20] 19 cmH20   Intake/Output Summary (Last 24 hours) at 09/05/2022 3086 Last data filed at 09/05/2022 0400 Gross per 24 hour  Intake 1828.76 ml  Output 2100 ml  Net -271.24 ml    Filed Weights   09/03/22 0406 09/04/22 0412 09/05/22 0440  Weight: 99.1 kg 93.1 kg 98.9 kg    Examination:  General: ventilated on hospital bed  HENT: ETT in place  PULM: CTA bilaterally CV: No murmurs,gallops, friction rubs, RRR  GI: Abdomen soft, BS+ in all quadrants  MSK: left hand weaker than right, overall generalized weakness Neuro: alert, anxious, minimally sedated on vent    Resolved Hospital Problem list   Myoclonus> likely from  agitation Septic shock   Assessment & Plan:  Acute respiratory failure with hypoxemia due to Flu A pneumonia OSA with BIPAP non-compliance RLL pneumonia  - Lung fields clear to auscultation bilaterally  P: Continue VAP prevention/Daily WUA and SBT  Received Lasix 40mg  x 1 yesterday- still +6L, BL 1+ Lower Extremity edema, Cr 0.58, Repeat lasix dose 40mg  x1 Wean to extubate with minimal sedation  Continue tamiflu,  completed 5 day course of ceftriaxone/doxycycline   Hemoptysis- none noted last 24hrs  - No evidence of bleeding, hgb 7.7  Transfuse if hgb < 7.0  Repeat CBC on 1/25   P: continue to monitor for hemoptysis or any bleeding Continue lovenox   Septic Shock resolved  -No evidence of fever and rest of VSS, no leukocytosis P: continue to monitor VS and daily CBCs   History of atrial fibrillation  -sinus bradycaria/Normal Sinus Rhythm  Tele Continue to remain of precedex, continue to decrease sedation  Transition back to eliquis 5mg  BID  Bipolar disorder Continue Risperdal Continue to Monitor QTC  Acute on chronic anemia  Continue to Monitor for bleeding Transfuse PRBC for Hgb < 7 gm/dL Repeat CBC 1/25   DM2 with hyperglycemia Continue to monitor CBGs q4  Continue Glargine/SSI   Prolonged QTc  -Improved on 1/23 EKG in comparison to 1/19 EKG  Continue to Monitor   Best Practice (right click and "Reselect all SmartList Selections" daily)   Diet/type: tubefeeds DVT prophylaxis: LMWH prophylactic dose GI prophylaxis: H2B Lines: N/A Foley:  N/A Code Status:  full code Last date of multidisciplinary goals of care discussion- updated Brother "[name]" 1/24  Critical care time: 35 minutes   CRITICAL CARE Performed by: Waldo Laine RN, BSN, S-ACNP   Total critical care time: 35 minutes  Critical care time was exclusive of separately billable procedures and treating other patients.  Critical care was necessary to treat or prevent imminent or life-threatening  deterioration.  Critical care was time spent personally by me on the following activities: development of treatment plan with patient and/or surrogate as well as nursing, discussions with consultants, evaluation of patient's response to treatment, examination of patient, obtaining history from patient or surrogate, ordering and performing treatments and interventions, ordering and review of laboratory studies, ordering and review of radiographic studies, pulse oximetry and re-evaluation of patient's condition.   Laurence Slate RN, BSN, S-ACNP  [employer]

## 2022-09-05 NOTE — Progress Notes (Incomplete)
NAME:  Becky Hendricks, MRN:  [id number], DOB:  07/09/1946, LOS: 6 ADMISSION DATE:  08/30/2022, CONSULTATION DATE:  *** REFERRING MD:  ***, CHIEF COMPLAINT:  ***   History of Present Illness:  ***  Pertinent  Medical History  ***  Significant Hospital Events: Including procedures, antibiotic start and stop dates in addition to other pertinent events     Interim History / Subjective:  ***  Objective   Blood pressure (!) 117/53, pulse (!) 43, temperature (!) 96.6 F (35.9 C), resp. rate (!) 24, height 5\' 6"  (1.676 m), weight 98.9 kg, SpO2 99 %.    Vent Mode: PRVC FiO2 (%):  [30 %-40 %] 30 % Set Rate:  [24 bmp] 24 bmp Vt Set:  [470 mL] 470 mL PEEP:  [5 cmH20] 5 cmH20 Plateau Pressure:  [18 cmH20-19 cmH20] 19 cmH20   Intake/Output Summary (Last 24 hours) at 09/05/2022 0727 Last data filed at 09/05/2022 0400 Gross per 24 hour  Intake 1828.76 ml  Output 2100 ml  Net -271.24 ml   Filed Weights   09/03/22 0406 09/04/22 0412 09/05/22 0440  Weight: 99.1 kg 93.1 kg 98.9 kg    Examination: General: *** HENT: *** Lungs: *** Cardiovascular: *** Abdomen: *** Extremities: *** Neuro: *** GU: ***  Resolved Hospital Problem list   ***  Assessment & Plan:  ***  Best Practice (right click and "Reselect all SmartList Selections" daily)   Diet/type: {diet type:25684} DVT prophylaxis: {anticoagulation (Optional):25687} GI prophylaxis: {CZ:66063} Lines: {Central Venous Access:25771} Foley:  {Central Venous Access:25691} Code Status:  {Code Status:26939} Last date of multidisciplinary goals of care discussion [***]  Labs   CBC: Recent Labs  Lab 08/30/22 0743 08/30/22 0744 08/31/22 0748 08/31/22 1408 09/02/22 0009 09/02/22 0335 09/03/22 0254 09/04/22 0846 09/05/22 0105  WBC 16.0*  --  12.4*  --   --  8.1 10.0 9.2 8.8  NEUTROABS 11.0*  --   --   --   --   --   --   --   --   HGB 10.7*   < > 8.5*   < > 8.8* 8.1* 7.5* 8.4* 7.7*  HCT 36.7   < > 28.3*   < > 26.0* 26.6*  26.4* 27.5* 26.8*  MCV 85.7  --  82.7  --   --  83.4 85.7 82.6 83.8  PLT 198  --  143*  --   --  149* 173 195 226   < > = values in this interval not displayed.    Basic Metabolic Panel: Recent Labs  Lab 08/31/22 0748 08/31/22 1408 09/01/22 0160 09/01/22 2035 09/02/22 0009 09/02/22 0335 09/03/22 0254 09/04/22 0846 09/05/22 0105  NA 138   < > 136   < > 138 134* 138 138 139  K 3.2*   < > 3.8   < > 4.3 4.0 3.8 4.2 4.2  CL 102  --  103  --   --  102 106 106 103  CO2 26  --  25  --   --  25 25 27 26   GLUCOSE 129*  --  183*  --   --  118* 221* 243* 221*  BUN 19  --  19  --   --  16 19 17 21   CREATININE 0.73  --  0.66  --   --  0.50 0.59 0.59 0.58  CALCIUM 7.6*  --  7.2*  --   --  7.2* 7.3* 7.6* 8.0*  MG 1.6*  --  2.2  --   --  2.1 2.1 2.2  --   PHOS 2.1*  --  2.0*  --   --  3.1 1.9* 3.5  --    < > = values in this interval not displayed.   GFR: Estimated Creatinine Clearance: 73.1 mL/min (by C-G formula based on SCr of 0.58 mg/dL). Recent Labs  Lab 08/30/22 0743 08/31/22 0748 09/02/22 0335 09/03/22 0254 09/04/22 0846 09/05/22 0105  PROCALCITON 0.81  --   --   --   --   --   WBC 16.0*   < > 8.1 10.0 9.2 8.8  LATICACIDVEN 1.2  --   --   --   --   --    < > = values in this interval not displayed.    Liver Function Tests: Recent Labs  Lab 08/30/22 0743  AST 27  ALT 12  ALKPHOS 29*  BILITOT 1.3*  PROT 6.3*  ALBUMIN 2.7*   No results for input(s): "LIPASE", "AMYLASE" in the last 168 hours. No results for input(s): "AMMONIA" in the last 168 hours.  ABG    Component Value Date/Time   PHART 7.417 09/02/2022 0009   PCO2ART 43.4 09/02/2022 0009   PO2ART 66 (L) 09/02/2022 0009   HCO3 28.0 09/02/2022 0009   TCO2 29 09/02/2022 0009   ACIDBASEDEF 1.0 08/31/2022 1408   O2SAT 93 09/02/2022 0009     Coagulation Profile: Recent Labs  Lab 08/30/22 0743  INR 1.3*    Cardiac Enzymes: No results for input(s): "CKTOTAL", "CKMB", "CKMBINDEX", "TROPONINI" in the last  168 hours.  HbA1C: Hgb A1c MFr Bld  Date/Time Value Ref Range Status  03/30/2021 03:11 AM 7.0 (H) 4.8 - 5.6 % Final    Comment:    (NOTE) Pre diabetes:          5.7%-6.4%  Diabetes:              >6.4%  Glycemic control for   <7.0% adults with diabetes   11/18/2020 01:54 AM 6.1 (H) 4.8 - 5.6 % Final    Comment:    (NOTE) Pre diabetes:          5.7%-6.4%  Diabetes:              >6.4%  Glycemic control for   <7.0% adults with diabetes     CBG: Recent Labs  Lab 09/04/22 1151 09/04/22 1546 09/04/22 1935 09/04/22 2331 09/05/22 0337  GLUCAP 229* 253* 221* 210* 211*    Review of Systems:   ***  Past Medical History:  She,  has a past medical history of Atrial fibrillation (HCC), Bipolar disorder (HCC), Bipolar disorder (HCC) (04/27/2020), Clostridium difficile diarrhea (08/02/2019), Dehydration, Essential hypertension, Fall (02/11/2020), Morbid obesity (HCC), Osteomyelitis (HCC) (12/03/2019), Pneumonia due to COVID-19 virus (09/15/2019), Type 2 diabetes mellitus (HCC), and Weakness.   Surgical History:   Past Surgical History:  Procedure Laterality Date   INCISION AND DRAINAGE PERIRECTAL ABSCESS Left 12/04/2019   Procedure: IRRIGATION AND DEBRIDEMENT LEFT BUTTOCK ABSCESS;  Surgeon: Violeta Gelinas, MD;  Location: [facility] OR;  Service: General;  Laterality: Left;   INCISION AND DRAINAGE PERIRECTAL ABSCESS Left 12/10/2019   Procedure: REPEAT IRRIGATION AND DEBRIDEMENT BUTTOCK  ABSCESS;  Surgeon: Abigail Miyamoto, MD;  Location: [facility] OR;  Service: General;  Laterality: Left;   IR FLUORO GUIDE CV LINE RIGHT  11/25/2020   IR RADIOLOGIST EVAL & MGMT  12/29/2020   IR US GUIDE VASC ACCESS RIGHT  11/25/2020     Social History:   reports that she has never  smoked. She has never used smokeless tobacco. She reports that she does not currently use alcohol. She reports that she does not currently use drugs.   Family History:  Her Family history is unknown by patient.   Allergies Allergies   Allergen Reactions   Chlorhexidine Gluconate Itching   Vancomycin     Vancomycin infusion related reaction- May need Vanc to be given at a slower rate    Acetazolamide Er Rash     Home Medications  Prior to Admission medications   Medication Sig Start Date End Date Taking? Authorizing Provider  apixaban (ELIQUIS) 5 MG TABS tablet Take 5 mg by mouth 2 (two) times daily.   Yes [provider]  atorvastatin (LIPITOR) 10 MG tablet Take 10 mg by mouth at bedtime.   Yes [provider]  calcium carbonate (TUMS - DOSED IN MG ELEMENTAL CALCIUM) 500 MG chewable tablet Chew 500 mg by mouth in the morning.   Yes [provider]  carvedilol (COREG) 3.125 MG tablet Take 3.125 mg by mouth at bedtime. 11/13/21  Yes [provider]  divalproex (DEPAKOTE) 500 MG DR tablet Take 500 mg by mouth 2 (two) times daily.   Yes [provider]  Emollient (EUCERIN SKIN CALMING) CREA Apply 1 application  topically at bedtime. To arms and dry skin   Yes [provider]  empagliflozin (JARDIANCE) 10 MG TABS tablet Take 10 mg by mouth in the morning.   Yes [provider]  ergocalciferol (VITAMIN D2) 1.25 MG (50000 UT) capsule Take 50,000 Units by mouth See admin instructions. 50,000 units once daily in the morning on Tuesday, Friday.   Yes [provider]  insulin lispro (HUMALOG) 100 UNIT/ML injection Inject 0-12 Units into the skin See admin instructions. Inject 0-12 units 3 times daily before meals and at bedtime per sliding scale: BS   70-200 : 0 units BS 201-250 : 2 units BS 251-300 : 4 units BS 301-350 : 6 units BS 351-400 : 8 units BS     > 400 : 12 units, recheck in 2 hours if BS > 350 or < 100, notify provider.   Yes [provider]  Ipratropium-Albuterol (COMBIVENT RESPIMAT) 20-100 MCG/ACT AERS respimat Inhale 1 puff into the lungs every 12 (twelve) hours.   Yes [provider]  Lactobacillus (ACIDOPHILUS) CAPS capsule  Take 1 capsule by mouth 2 (two) times daily.   Yes [provider]  lidocaine (LMX) 4 % cream Apply 1 application  topically in the morning. Apply to gauze and affix over right posterior auricular area under cannula tubing for cushion/pain   Yes [provider]  PSYLLIUM HUSK PO Take 0.4 g by mouth 3 (three) times daily.   Yes [provider]  risperiDONE (RISPERDAL) 2 MG tablet Take 2 mg by mouth in the morning.   Yes [provider]  risperiDONE (RISPERDAL) 3 MG tablet Take 3 mg by mouth at bedtime. 11/09/21  Yes [provider]  spironolactone (ALDACTONE) 25 MG tablet Take 12.5 mg by mouth in the morning. 11/27/21  Yes [provider]  tamsulosin (FLOMAX) 0.4 MG CAPS capsule Take 1 capsule (0.4 mg total) by mouth every other day. Patient taking differently: Take 0.4 mg by mouth every other day. (Morning) 04/20/21  Yes Esaw Grandchild A, DO  divalproex (DEPAKOTE) 250 MG DR tablet Take 3 tablets (750 mg total) by mouth 2 (two) times daily. Patient not taking: Reported on 08/30/2022 04/19/21   Pennie Banter, DO  Critical care time: ***

## 2022-09-05 NOTE — Progress Notes (Signed)
OT Cancellation Note  Patient Details Name: Becky Hendricks MRN: [id number] DOB: 10-22-1945   Cancelled Treatment:    Reason Eval/Treat Not Completed: OT screened, no needs identified, will sign off. Per OT note on 04/22/2022 pt bed level ADL from staff and hoyer lift to wheelchair. No skilled OT needs identified then and based off of this report then no skilled OT needs now.   [place], OTR/L [facility] [phone] Office [phone]    Almon Register 09/05/2022, 12:30 PM

## 2022-09-05 NOTE — Progress Notes (Signed)
Pharmacy ICU Insulin Management   CBGs above goal 140-180 mg/dL: Yes   Current regimen (include units of SSI in last 24hr): - Insulin glargine 8 units daily - Insulin aspart 2 units q4h  - Insulin aspart mSSI q4h (used 20 units in last 24hr)   Medications affecting CBG levels:   Plan: Increase Insulin aspart 6 units q4hr Increase Insulin glargine 12 units daily Continue Insulin aspart mSSI q4hr Continue to monitor blood glucose  [employer], Pharm.D PGY1 Pharmacy Resident 09/05/2022 9:04 AM

## 2022-09-05 NOTE — Procedures (Signed)
Extubation Procedure Note  Patient Details:   Name: Irlanda Croghan DOB: 01/22/1946 MRN: [id number]   Airway Documentation:    Vent end date: 09/05/22 Vent end time: 1030   Evaluation  O2 sats: stable throughout Complications: No apparent complications Patient did tolerate procedure well. Bilateral Breath Sounds: Clear, Diminished   Extubated per MD order & placed on 4L [place]. Patient able to speak & cough well post extubation.  Kathie Dike 09/05/2022, 10:34 AM

## 2022-09-05 NOTE — Progress Notes (Signed)
PT Cancellation Note  Patient Details Name: Becky Hendricks MRN: [id number] DOB: December 02, 1945   Cancelled Treatment:    Reason Eval/Treat Not Completed: PT screened, no needs identified, will sign off (Noted from pts chart that she has been at a SNF for some time.  Per Ginger Carne, PT's note from 04/22/21, pt was total care and the SNF was using a mechanical lift prior to that admit therefore pt is not appropriate for PT.  Will sign off.)   [name] 09/05/2022, 12:54 PM [name],PT Acute Rehab Services [phone]

## 2022-09-06 DIAGNOSIS — J9601 Acute respiratory failure with hypoxia: Secondary | ICD-10-CM | POA: Diagnosis not present

## 2022-09-06 LAB — GLUCOSE, CAPILLARY
Glucose-Capillary: 103 mg/dL — ABNORMAL HIGH (ref 70–99)
Glucose-Capillary: 106 mg/dL — ABNORMAL HIGH (ref 70–99)
Glucose-Capillary: 123 mg/dL — ABNORMAL HIGH (ref 70–99)
Glucose-Capillary: 180 mg/dL — ABNORMAL HIGH (ref 70–99)
Glucose-Capillary: 241 mg/dL — ABNORMAL HIGH (ref 70–99)
Glucose-Capillary: 191 mg/dL — ABNORMAL HIGH (ref 70–99)

## 2022-09-06 LAB — BASIC METABOLIC PANEL
Anion gap: 11 (ref 5–15)
BUN: 10 mg/dL (ref 8–23)
CO2: 26 mmol/L (ref 22–32)
Calcium: 8.5 mg/dL — ABNORMAL LOW (ref 8.9–10.3)
Chloride: 96 mmol/L — ABNORMAL LOW (ref 98–111)
Creatinine, Ser: 0.57 mg/dL (ref 0.44–1.00)
GFR, Estimated: >60 mL/min (ref >60)
Glucose, Bld: 207 mg/dL — ABNORMAL HIGH (ref 70–99)
Potassium: 3.9 mmol/L (ref 3.5–5.1)
Sodium: 133 mmol/L — ABNORMAL LOW (ref 135–145)

## 2022-09-06 LAB — CBC
HCT: 30.2 % — ABNORMAL LOW (ref 36.0–46.0)
Hemoglobin: 8.6 g/dL — ABNORMAL LOW (ref 12.0–15.0)
MCH: 24 pg — ABNORMAL LOW (ref 26.0–34.0)
MCHC: 28.5 g/dL — ABNORMAL LOW (ref 30.0–36.0)
MCV: 84.1 fL (ref 80.0–100.0)
Platelets: 287 10*3/uL (ref 150–400)
RBC: 3.59 MIL/uL — ABNORMAL LOW (ref 3.87–5.11)
RDW: 17.6 % — ABNORMAL HIGH (ref 11.5–15.5)
WBC: 7.6 10*3/uL (ref 4.0–10.5)
nRBC: 0.4 % — ABNORMAL HIGH (ref 0.0–0.2)

## 2022-09-06 MED ORDER — FAMOTIDINE 20 MG PO TABS
20.0000 mg | ORAL_TABLET | Freq: Two times a day (BID) | ORAL | Status: DC
  Administered 2022-09-06: 20 mg via ORAL
  Filled 2022-09-06: qty 1

## 2022-09-06 MED ORDER — SENNA 8.6 MG PO TABS
1.0000 | ORAL_TABLET | Freq: Two times a day (BID) | ORAL | Status: DC
  Administered 2022-09-07 – 2022-09-08 (×2): 8.6 mg via ORAL
  Filled 2022-09-06 (×3): qty 1

## 2022-09-06 MED ORDER — ADULT MULTIVITAMIN W/MINERALS CH
1.0000 | ORAL_TABLET | Freq: Every day | ORAL | Status: DC
  Administered 2022-09-06 – 2022-09-08 (×3): 1 via ORAL
  Filled 2022-09-06 (×3): qty 1

## 2022-09-06 MED ORDER — POLYETHYLENE GLYCOL 3350 17 G PO PACK: 17.0000 g | PACK | Freq: Every day | ORAL | Status: DC | PRN

## 2022-09-06 MED ORDER — DOCUSATE SODIUM 100 MG PO CAPS
100.0000 mg | ORAL_CAPSULE | Freq: Two times a day (BID) | ORAL | Status: DC
  Administered 2022-09-06 – 2022-09-08 (×4): 100 mg via ORAL
  Filled 2022-09-06 (×4): qty 1

## 2022-09-06 MED ORDER — APIXABAN 5 MG PO TABS
5.0000 mg | ORAL_TABLET | Freq: Two times a day (BID) | ORAL | Status: DC
  Administered 2022-09-06 – 2022-09-08 (×5): 5 mg via ORAL
  Filled 2022-09-06 (×5): qty 1

## 2022-09-06 MED ORDER — RISPERIDONE 3 MG PO TABS
3.0000 mg | ORAL_TABLET | Freq: Every evening | ORAL | Status: DC
  Administered 2022-09-07: 3 mg via ORAL
  Filled 2022-09-06 (×3): qty 1

## 2022-09-06 MED ORDER — RISPERIDONE 2 MG PO TABS
2.0000 mg | ORAL_TABLET | Freq: Every day | ORAL | Status: DC
  Administered 2022-09-06: 2 mg via ORAL
  Filled 2022-09-06: qty 1

## 2022-09-06 MED ORDER — INSULIN ASPART 100 UNIT/ML IJ SOLN
0.0000 [IU] | Freq: Three times a day (TID) | INTRAMUSCULAR | Status: DC
  Administered 2022-09-06: 2 [IU] via SUBCUTANEOUS
  Administered 2022-09-06: 3 [IU] via SUBCUTANEOUS
  Administered 2022-09-06: 5 [IU] via SUBCUTANEOUS
  Administered 2022-09-07: 3 [IU] via SUBCUTANEOUS
  Administered 2022-09-07: 8 [IU] via SUBCUTANEOUS
  Administered 2022-09-08: 3 [IU] via SUBCUTANEOUS
  Administered 2022-09-08: 5 [IU] via SUBCUTANEOUS

## 2022-09-06 MED ORDER — ATORVASTATIN CALCIUM 10 MG PO TABS
10.0000 mg | ORAL_TABLET | Freq: Every day | ORAL | Status: DC
  Administered 2022-09-06 – 2022-09-07 (×2): 10 mg via ORAL
  Filled 2022-09-06 (×2): qty 1

## 2022-09-06 MED ORDER — ACETAMINOPHEN 325 MG PO TABS
650.0000 mg | ORAL_TABLET | Freq: Four times a day (QID) | ORAL | Status: DC
  Administered 2022-09-06 – 2022-09-08 (×7): 650 mg via ORAL
  Filled 2022-09-06 (×7): qty 2

## 2022-09-06 MED ORDER — GLUCERNA SHAKE PO LIQD
237.0000 mL | Freq: Three times a day (TID) | ORAL | Status: DC
  Administered 2022-09-06 – 2022-09-08 (×5): 237 mL via ORAL
  Filled 2022-09-06 (×2): qty 237

## 2022-09-06 MED ORDER — INSULIN GLARGINE-YFGN 100 UNIT/ML ~~LOC~~ SOLN
6.0000 [IU] | Freq: Every day | SUBCUTANEOUS | Status: DC
  Administered 2022-09-06 – 2022-09-08 (×3): 6 [IU] via SUBCUTANEOUS
  Filled 2022-09-06 (×3): qty 0.06

## 2022-09-06 MED ORDER — VALPROIC ACID 250 MG PO CAPS
500.0000 mg | ORAL_CAPSULE | Freq: Two times a day (BID) | ORAL | Status: DC
  Administered 2022-09-06 (×2): 500 mg via ORAL
  Filled 2022-09-06 (×4): qty 2

## 2022-09-06 NOTE — Inpatient Diabetes Management (Signed)
Inpatient Diabetes Program Recommendations  AACE/ADA: New Consensus Statement on Inpatient Glycemic Control   Target Ranges:  Prepandial:   less than 140 mg/dL      Peak postprandial:   less than 180 mg/dL (1-2 hours)      Critically ill patients:  140 - 180 mg/dL    Latest Reference Range & Units 09/06/22 03:29 09/06/22 04:42  Glucose-Capillary 70 - 99 mg/dL 103 (H) 106 (H)    Latest Reference Range & Units 09/05/22 08:09 09/05/22 11:47 09/05/22 16:18 09/05/22 19:34 09/05/22 23:37  Glucose-Capillary 70 - 99 mg/dL 220 (H)  Novolog 7 units  Semglee 8 units 163 (H)  Novolog 9 units 159 (H)  Novolog 9 units 219 (H)  Novolog 5 units 100 (H)   Review of Glycemic   Diabetes history: DM2 Outpatient Diabetes medications: Jardiance 10 mg daily, Humalog 0-10 units Q4H Current orders for Inpatient glycemic control: Semglee 12 units daily, Novolog 6 units Q4H, Novolog 0-15 units Q4H; Vital @ 55 ml/hr  Inpatient Diabetes Program Recommendations:    Insulin:  If tube feeding has been stopped, please discontinue Novolog 6 units Q4H.  Please consider decreasing Semglee to 6  units daily.  NOTE: In reviewing chart, noted patient was extubated yesterday around 10:30 am.  Thanks, Barnie Alderman, RN, MSN, [place] [facility] Coordinator Inpatient Diabetes Program [phone] (Team Pager from 8am to [place])

## 2022-09-06 NOTE — Progress Notes (Signed)
Pharmacy ICU Insulin Management   CBGs above goal 140-180 mg/dL: Yes   Current regimen (include units of SSI in last 24hr): - Insulin glargine 6 units daily - Insulin aspart 6 units q4h  - Insulin aspart mSSI q4h (used 21 units in last 24hr)   Medications affecting CBG levels: TF discontinued   Plan: Discussed with team and will continue insulin glargine 6 units daily and start insulin aspart mSSI with meals and discontinue TF coverage Pharmacy will sign off at this time  Jeneen Rinks, Pharm.D PGY1 Pharmacy Resident 09/06/2022 12:05 PM

## 2022-09-06 NOTE — Progress Notes (Signed)
Pt arrived to 4e from 80m. Pt oriented to room and staff. Purewick placed. Oxygen set to 2L. Pt denies pain. Vitals stable. Telemetry placed and CCMD notified.

## 2022-09-06 NOTE — Progress Notes (Signed)
Attending:    Subjective: Flu, RLL pneumonia Extubated yesterday, doing well overnight and this morning  Objective: Vitals:   09/06/22 0446 09/06/22 0500 09/06/22 0600 09/06/22 0700  BP:  (!) 159/68 (!) 145/63 (!) 176/70  Pulse:  80 77 92  Resp:  19 (!) 24 15  Temp:      TempSrc:      SpO2:  95% 96% 90%  Weight: 97.5 kg     Height:       Pressure Support:  [8 cmH20] 8 cmH20  Intake/Output Summary (Last 24 hours) at 09/06/2022 0921 Last data filed at 09/06/2022 0600 Gross per 24 hour  Intake 59 ml  Output 3700 ml  Net -3641 ml    General:  Resting comfortably in bed HENT: NCAT OP clear PULM: CTA B, normal effort CV: RRR, no mgr GI: BS+, soft, nontender MSK: normal bulk and tone Neuro: awake, alert, no distress, MAEW    CBC    Component Value Date/Time   WBC 7.6 09/06/2022 0811   RBC 3.59 (L) 09/06/2022 0811   HGB 8.6 (L) 09/06/2022 0811   HCT 30.2 (L) 09/06/2022 0811   PLT 287 09/06/2022 0811   MCV 84.1 09/06/2022 0811   MCH 24.0 (L) 09/06/2022 0811   MCHC 28.5 (L) 09/06/2022 0811   RDW 17.6 (H) 09/06/2022 0811   LYMPHSABS 2.2 08/30/2022 0743   MONOABS 2.5 (H) 08/30/2022 0743   EOSABS 0.0 08/30/2022 0743   BASOSABS 0.1 08/30/2022 0743    BMET    Component Value Date/Time   NA 133 (L) 09/06/2022 0811   K 3.9 09/06/2022 0811   CL 96 (L) 09/06/2022 0811   CO2 26 09/06/2022 0811   GLUCOSE 207 (H) 09/06/2022 0811   BUN 10 09/06/2022 0811   CREATININE 0.57 09/06/2022 0811   CREATININE 0.63 06/27/2020 0924   CALCIUM 8.5 (L) 09/06/2022 0811   GFRNONAA >60 09/06/2022 0811   GFRNONAA 90 06/27/2020 0924   GFRAA 104 06/27/2020 0924      Impression/Plan: Acute respiratory failure with hypoxemia due to Flu A pneumonia and RLL pneumonia complicated by pulmonary edema, acute: > hold lasix today > wean off O2 for O2 saturation > 88% > out of bed, incentive spirometry  Atrial fibrillation > tele > continue eliquis (monitor for hemoptysis)  Bipolar  disorder > risperal/valproate to continue, discuss with pharmacy and ensure we are treating with home doses  Anemia of critical illness without active bleeding > Monitor for bleeding > Transfuse PRBC for Hgb < 7 gm/dL  Nutrition needs > advance diet  Transfer to [place]  My cc time n/a minutes  Roselie Awkward, MD [place] [facility] Pager: [phone] Cell: [phone] After 7pm: [phone]

## 2022-09-06 NOTE — Progress Notes (Signed)
NAME:  Becky Hendricks, MRN:  [id number], DOB:  10-26-1945, LOS: 7 ADMISSION DATE:  08/30/2022, CONSULTATION DATE:  1/18 REFERRING MD:  Ronnald Nian, CHIEF COMPLAINT:  confusion, dyspnea  History of Present Illness:  77 y/o female admitted on 1/18 in the context of acute hypoxemic respiratory failure and sepsis due to RLL pneumonia.  Found to be positive for Flu A.  Pertinent  Medical History  Atrial fibrillation Bipolar disorder C diff Hypertension Morbid obesity Osteomyelitis DM2 Severe physical deconditioning> wheelchair bound  Significant Hospital Events: Including procedures, antibiotic start and stop dates in addition to other pertinent events   08/30/2022 intubated, blood cultures negative, Flu A positive 1/29 trach aspirate culture OPF, c. Diff negative 1/20 extubated, reintubated overnight 1/24 Anxious/Weaning with minimal sedation, Antibiotics completed   Interim History / Subjective:  No hemoptysis overnight  Minimal Sedation    Objective   Blood pressure (!) 162/72, pulse 74, temperature 98.1 F (36.7 C), temperature source Oral, resp. rate (!) 28, height 5\' 6"  (1.676 m), weight 97.5 kg, SpO2 97 %.        Intake/Output Summary (Last 24 hours) at 09/06/2022 1010 Last data filed at 09/06/2022 0900 Gross per 24 hour  Intake 539 ml  Output 4000 ml  Net -3461 ml    Filed Weights   09/04/22 0412 09/05/22 0440 09/06/22 0446  Weight: 93.1 kg 98.9 kg 97.5 kg    Examination:  General: chronically ill on ICU hospital bed  HENT: normocephalic, MM pink and moist PULM: CTA bilaterally, no respiratory distress or increased WOB CV: No murmurs,gallops, friction rubs, RRR  GI: Abdomen soft, BS+ in all quadrants, Panda tube in place  MSK: left hand weaker than right, overall generalized weakness Neuro: alert to self, place, and time, disoriented to situation, anxious, follows commands   Resolved Hospital Problem list   Myoclonus> likely from agitation Septic shock   Hemoptysis   Assessment & Plan:  Acute respiratory failure with hypoxemia due to Flu A pneumonia OSA with BIPAP non-compliance RLL pneumonia - Lung fields remain clear to auscultation bilaterally  P: Continue IS, deep breathing exercises, and mobilization   Hemoptysis- none noted last 48hrs-resolved - No evidence of bleeding, hgb 8.6 Transfuse if hgb < 7.0  Repeat CBC on 1/26 Transitioned from lovenox back to eliquis yesterday   History of atrial fibrillation  -sinus bradycaria/Normal Sinus Rhythm  Continue Tele Transfer to Cardiac Tele Transitioned back to eliquis 5mg  BID  Bipolar disorder Continue Risperdal home dose  Acute on chronic anemia  Continue to Monitor for bleeding Transfuse PRBC for Hgb < 8.6 gm/dL Repeat CBC 1/26  DM2 with hyperglycemia Continue to monitor CBGs q4  Discussed with pharmacy in regards to decrease long acting insulin requirements and discontinuing tube feed coverage due to Blood sugars decreasing slightly   Prolonged QTc  -Improved on 1/23 EKG in comparison to 1/19 EKG  Spot check QTC 0.48 1/25  Changed Risperdal back to home dose   Continue to Monitor on Tele  Transfer to Cardiac Tele   Best Practice (right click and "Reselect all SmartList Selections" daily)   Diet/type: tubefeeds DVT prophylaxis: LMWH prophylactic dose GI prophylaxis: H2B Lines: N/A Foley:  N/A Code Status:  full code Last date of multidisciplinary goals of care discussion- updated Brother "[name]" 1/25  Critical care time: 35 minutes   CRITICAL CARE Performed by: Waldo Laine RN, BSN, S-ACNP   Total critical care time: 35 minutes  Critical care time was exclusive of separately billable procedures and treating  other patients.  Critical care was necessary to treat or prevent imminent or life-threatening deterioration.  Critical care was time spent personally by me on the following activities: development of treatment plan with patient and/or surrogate as well  as nursing, discussions with consultants, evaluation of patient's response to treatment, examination of patient, obtaining history from patient or surrogate, ordering and performing treatments and interventions, ordering and review of laboratory studies, ordering and review of radiographic studies, pulse oximetry and re-evaluation of patient's condition.   Laurence Slate RN, BSN, S-ACNP  [employer]

## 2022-09-06 NOTE — Progress Notes (Signed)
Nutrition Follow-up  DOCUMENTATION CODES:  Obesity unspecified  INTERVENTION:  Continue current diet as ordered Encourage PO intake Glucerna Shake po TID, each supplement provides 220 kcal and 10 grams of protein, prefers strawberry MVI with minerals daily  NUTRITION DIAGNOSIS:  Inadequate oral intake related to inability to eat as evidenced by NPO status. - remains applicable  GOAL:  Patient will meet greater than or equal to 90% of their needs - progressing, diet in place  MONITOR:  Vent status, Labs, Weight trends, TF tolerance  REASON FOR ASSESSMENT:  Consult Enteral/tube feeding initiation and management (trickle)  ASSESSMENT:  Pt with hx of HTN, DM type 2, atrial fibrillation, functional quadriplegia, and CHF presented to ED from SNF with AMS and hypoxia. Found to be flu+ and required intubation in ED.  1/18 - intubated 1/20 - extubated, reintubated 1/22 - cortrak to be placed today 1/24 - extubated, advanced to full liquids  1/22 - advanced to carb modified diet  Cortrak still in place, pt is doing well with PO and plans are to remove today as long as lunch is well tolerated. Pt stable to be transferred out of ICU.  Met with pt at bedside, anxious, but awake and alert. Looking forward to getting out of the ICU and to her new room. Pt has never had a nutrition supplement but she is agreeable to receiving them while admitted. Pt prefers strawberry flavors - will add glucerna to guarantee she receives the flavor she wants. Will monitor PO trends and adjust supplement regimen as needed.     Intake/Output Summary (Last 24 hours) at 09/06/2022 1139 Last data filed at 09/06/2022 0900 Gross per 24 hour  Intake 539 ml  Output 4000 ml  Net -3461 ml  Net IO Since Admission: 2,375.16 mL [09/06/22 1139]  Nutritionally Relevant Medications: Scheduled Meds:  atorvastatin  10 mg Oral QHS   docusate sodium  100 mg Oral BID   famotidine  20 mg Oral BID   feeding supplement  (PROSource TF20)  60 mL Per Tube Daily   insulin aspart  0-15 Units Subcutaneous TID WC   insulin glargine-yfgn  6 Units Subcutaneous Daily   polyethylene glycol  17 g Per Tube Daily   senna  1 tablet Oral BID   spironolactone  12.5 mg Oral q AM   Continuous Infusions:  feeding supplement (VITAL 1.5 CAL) Stopped (09/05/22 1900)    Labs Reviewed: CBG ranges from 100-220 mg/dL over the last 24 hours   NUTRITION - FOCUSED PHYSICAL EXAM: Flowsheet Row Most Recent Value  Orbital Region No depletion  Upper Arm Region No depletion  Thoracic and Lumbar Region No depletion  Buccal Region No depletion  Temple Region No depletion  Clavicle Bone Region No depletion  Clavicle and Acromion Bone Region No depletion  Scapular Bone Region No depletion  Dorsal Hand No depletion  Patellar Region No depletion  Anterior Thigh Region No depletion  Posterior Calf Region No depletion  Edema (RD Assessment) Moderate  [pitting edema to the BLE]  Hair Reviewed  Eyes Reviewed  Mouth Reviewed  Skin Reviewed  Nails Reviewed   Diet Order:   Diet Order             Diet Carb Modified Fluid consistency: Thin; Room service appropriate? Yes  Diet effective now                   EDUCATION NEEDS:  Not appropriate for education at this time  Skin:  Skin Assessment: Reviewed  RN Assessment  Last BM:  1/25  Height:  Ht Readings from Last 1 Encounters:  08/30/22 5\' 6"  (1.676 m)    Weight:  Wt Readings from Last 1 Encounters:  09/06/22 97.5 kg    Ideal Body Weight:  59.1 kg  BMI:  Body mass index is 34.69 kg/m.  Estimated Nutritional Needs:  Kcal:  1800-2000 kcal/d Protein:  100-115g/d Fluid:  2-2.2L/d    Ranell Patrick, RD, LDN Clinical Dietitian RD pager # available in [facility]  After hours/weekend pager # available in [facility]

## 2022-09-07 DIAGNOSIS — J9601 Acute respiratory failure with hypoxia: Secondary | ICD-10-CM | POA: Diagnosis not present

## 2022-09-07 LAB — GLUCOSE, CAPILLARY
Glucose-Capillary: 123 mg/dL — ABNORMAL HIGH (ref 70–99)
Glucose-Capillary: 251 mg/dL — ABNORMAL HIGH (ref 70–99)
Glucose-Capillary: 166 mg/dL — ABNORMAL HIGH (ref 70–99)
Glucose-Capillary: 215 mg/dL — ABNORMAL HIGH (ref 70–99)

## 2022-09-07 MED ORDER — TAMSULOSIN HCL 0.4 MG PO CAPS
0.4000 mg | ORAL_CAPSULE | ORAL | Status: DC
  Administered 2022-09-07: 0.4 mg via ORAL
  Filled 2022-09-07 (×2): qty 1

## 2022-09-07 MED ORDER — ATORVASTATIN CALCIUM 10 MG PO TABS: 10.0000 mg | ORAL_TABLET | Freq: Every day | ORAL | Status: DC

## 2022-09-07 MED ORDER — RISPERIDONE 2 MG PO TABS
2.0000 mg | ORAL_TABLET | Freq: Every morning | ORAL | Status: DC
  Administered 2022-09-07 – 2022-09-08 (×2): 2 mg via ORAL
  Filled 2022-09-07 (×2): qty 1

## 2022-09-07 MED ORDER — LORAZEPAM 2 MG/ML IJ SOLN
0.5000 mg | Freq: Once | INTRAMUSCULAR | Status: AC | PRN
  Administered 2022-09-07: 0.5 mg via INTRAVENOUS
  Filled 2022-09-07: qty 1

## 2022-09-07 MED ORDER — VITAMIN D (ERGOCALCIFEROL) 1.25 MG (50000 UNIT) PO CAPS
50000.0000 [IU] | ORAL_CAPSULE | ORAL | Status: DC
  Administered 2022-09-07: 50000 [IU] via ORAL
  Filled 2022-09-07 (×2): qty 1

## 2022-09-07 MED ORDER — RISPERIDONE 3 MG PO TABS: 3.0000 mg | ORAL_TABLET | Freq: Two times a day (BID) | ORAL | Status: DC

## 2022-09-07 MED ORDER — DIVALPROEX SODIUM 500 MG PO DR TAB
500.0000 mg | DELAYED_RELEASE_TABLET | Freq: Two times a day (BID) | ORAL | Status: DC
  Administered 2022-09-07 – 2022-09-08 (×3): 500 mg via ORAL
  Filled 2022-09-07 (×4): qty 1

## 2022-09-07 NOTE — Progress Notes (Signed)
PROGRESS NOTE    Becky Hendricks  QQP:[id number] DOB: Jan 21, 1946 DOA: 08/30/2022 PCP: [name], MD   Brief Narrative:  77 y/o female admitted under PCCM in ICU on 1/18 in the context of acute hypoxemic respiratory failure and sepsis due to RLL pneumonia. Found to be positive for Flu A.  Required intubation and subsequently extubated.  Transferred under [facility] on 09/07/2022.  Significant Hospital Events: Including procedures, antibiotic start and stop dates in addition to other pertinent events   08/30/2022 intubated, blood cultures negative, Flu A positive 1/29 trach aspirate culture OPF, c. Diff negative 1/20 extubated, reintubated overnight 1/24 Anxious/Weaning with minimal sedation, Antibiotics completed   Assessment & Plan:   Principal Problem:   Acute hypoxic respiratory failure (HCC) Active Problems:   Altered mental status   On mechanically assisted ventilation (HCC)  Acute hypoxic respiratory failure secondary to influenza A pneumonia as well as community-acquired pneumonia/hemoptysis: Patient completed antibiotics for the bacterial pneumonia and Tamiflu as well.  Currently requiring 2 L of oxygen.  Unsure if patient was using any oxygen prior to hospitalization, she states that she was but she cannot tell me how many liters.  Continue supportive care for now and try to wean oxygen.  She also was noted to have some hemoptysis which has not stopped, her hemoglobin has remained stable.  Paroxysmal atrial fibrillation: Rates fairly controlled, continue Coreg as well as Eliquis.  Bipolar disorder: Stable.  Continue Risperdal and Depakote.  Anemia of chronic disease: Hemoglobin is stable.  Type 2 diabetes mellitus with hyperglycemia: Blood sugar labile.  Will continue current 6 units of Lantus along with SSI.  Prolonged QTc:  Hyperlipidemia: Continue atorvastatin.  Chronic diastolic congestive heart failure: Avoiding nephrotoxic agents, she is only on 2 L of oxygen, will hold  Lasix for now and reassess tomorrow.  DVT prophylaxis: SCDs Start: 08/30/22 0957 Eliquis   Code Status: Full Code  Family Communication:  None present at bedside.  Status is: Inpatient Remains inpatient appropriate because: Medically stable but per TOC, cannot go back to the facility today.   Estimated body mass index is 34.69 kg/m as calculated from the following:   Height as of this encounter: 5\' 6"  (1.676 m).   Weight as of this encounter: 97.5 kg.  Pressure Injury 04/21/21 Sacrum Medial Stage 2 -  Partial thickness loss of dermis presenting as a shallow open injury with a red, pink wound bed without slough. yellow, pink, craterous  04/25/21; updated staging, healed, samll opening (Active)  04/21/21 1720  Location: Sacrum  Location Orientation: Medial  Staging: Stage 2 -  Partial thickness loss of dermis presenting as a shallow open injury with a red, pink wound bed without slough.  Wound Description (Comments): yellow, pink, craterous  04/25/21; updated staging, healed, samll opening  Present on Admission: Yes     Pressure Injury 04/24/21 Lumbar Right Deep Tissue Pressure Injury - Purple or maroon localized area of discolored intact skin or blood-filled blister due to damage of underlying soft tissue from pressure and/or shear. (Active)  04/24/21 2000  Location: Lumbar  Location Orientation: Right  Staging: Deep Tissue Pressure Injury - Purple or maroon localized area of discolored intact skin or blood-filled blister due to damage of underlying soft tissue from pressure and/or shear.  Wound Description (Comments):   Present on Admission:      Pressure Injury Nose  Stage 2 -  Partial thickness loss of dermis presenting as a shallow open injury with a red, pink wound bed without slough.  scab (Active)     Location: Nose  Location Orientation: -- (bridge of nose)  Staging: Stage 2 -  Partial thickness loss of dermis presenting as a shallow open injury with a red, pink wound bed  without slough.  Wound Description (Comments): scab  Present on Admission:      Pressure Injury 09/06/22 Thigh Posterior;Right (Active)  09/06/22   Location: Thigh  Location Orientation: Posterior;Right  Staging:   Wound Description (Comments):   Present on Admission:   Dressing Type None 09/06/22 1330   Nutritional Assessment: Body mass index is 34.69 kg/m.Marland Kitchen Seen by dietician.  I agree with the assessment and plan as outlined below: Nutrition Status: Nutrition Problem: Inadequate oral intake Etiology: inability to eat Signs/Symptoms: NPO status Interventions: Refer to RD note for recommendations  . Skin Assessment: I have examined the patient's skin and I agree with the wound assessment as performed by the wound care RN as outlined below: Pressure Injury 04/21/21 Sacrum Medial Stage 2 -  Partial thickness loss of dermis presenting as a shallow open injury with a red, pink wound bed without slough. yellow, pink, craterous  04/25/21; updated staging, healed, samll opening (Active)  04/21/21 1720  Location: Sacrum  Location Orientation: Medial  Staging: Stage 2 -  Partial thickness loss of dermis presenting as a shallow open injury with a red, pink wound bed without slough.  Wound Description (Comments): yellow, pink, craterous  04/25/21; updated staging, healed, samll opening  Present on Admission: Yes     Pressure Injury 04/24/21 Lumbar Right Deep Tissue Pressure Injury - Purple or maroon localized area of discolored intact skin or blood-filled blister due to damage of underlying soft tissue from pressure and/or shear. (Active)  04/24/21 2000  Location: Lumbar  Location Orientation: Right  Staging: Deep Tissue Pressure Injury - Purple or maroon localized area of discolored intact skin or blood-filled blister due to damage of underlying soft tissue from pressure and/or shear.  Wound Description (Comments):   Present on Admission:      Pressure Injury Nose  Stage 2 -  Partial  thickness loss of dermis presenting as a shallow open injury with a red, pink wound bed without slough. scab (Active)     Location: Nose  Location Orientation: -- (bridge of nose)  Staging: Stage 2 -  Partial thickness loss of dermis presenting as a shallow open injury with a red, pink wound bed without slough.  Wound Description (Comments): scab  Present on Admission:      Pressure Injury 09/06/22 Thigh Posterior;Right (Active)  09/06/22   Location: Thigh  Location Orientation: Posterior;Right  Staging:   Wound Description (Comments):   Present on Admission:   Dressing Type None 09/06/22 1330    Consultants:  None  Procedures:  None  Antimicrobials:  Anti-infectives (From admission, onward)    Start     Dose/Rate Route Frequency Ordered Stop   09/01/22 1000  cefTRIAXone (ROCEPHIN) 2 g in sodium chloride 0.9 % 100 mL IVPB        2 g 200 mL/hr over 30 Minutes Intravenous Daily 08/31/22 1159 09/05/22 0850   08/31/22 2200  doxycycline (VIBRA-TABS) tablet 100 mg        100 mg Per Tube Every 12 hours 08/31/22 1159 09/05/22 0815   08/31/22 1000  cefTRIAXone (ROCEPHIN) 2 g in sodium chloride 0.9 % 100 mL IVPB  Status:  Discontinued        2 g 200 mL/hr over 30 Minutes Intravenous Daily 08/30/22 1111 08/31/22 1159  08/30/22 1400  oseltamivir (TAMIFLU) 6 MG/ML suspension 75 mg        75 mg Per Tube 2 times daily 08/30/22 1104 09/03/22 2131   08/30/22 1400  azithromycin (ZITHROMAX) 500 mg in sodium chloride 0.9 % 250 mL IVPB  Status:  Discontinued        500 mg 250 mL/hr over 60 Minutes Intravenous Daily 08/30/22 1111 08/31/22 1159   08/30/22 0745  vancomycin (VANCOREADY) IVPB 2000 mg/400 mL        2,000 mg 200 mL/hr over 120 Minutes Intravenous  Once 08/30/22 0738 08/30/22 1046   08/30/22 0745  ceFEPIme (MAXIPIME) 2 g in sodium chloride 0.9 % 100 mL IVPB        2 g 200 mL/hr over 30 Minutes Intravenous  Once 08/30/22 [id number] 08/30/22 0828         Subjective: Patient seen and  examined, she states that she is feeling well, she was mostly oriented and fully alert, has no complaints.  She is eager to go back to the facility.  Objective: Vitals:   09/07/22 0630 09/07/22 0740 09/07/22 0931 09/07/22 1140  BP: (!) 174/70 (!) 156/57 (!) 150/70 (!) 159/69  Pulse: 67 66 94 (!) 101  Resp: 20 (!) 26 20 20   Temp:   98 F (36.7 C) 97.9 F (36.6 C)  TempSrc:   Oral Oral  SpO2: 99% 98% 93% 91%  Weight:      Height:        Intake/Output Summary (Last 24 hours) at 09/07/2022 1401 Last data filed at 09/07/2022 1000 Gross per 24 hour  Intake 120 ml  Output 2700 ml  Net -2580 ml   Filed Weights   09/04/22 0412 09/05/22 0440 09/06/22 0446  Weight: 93.1 kg 98.9 kg 97.5 kg    Examination:  General exam: Appears calm and comfortable, obese Respiratory system: Diminished breath sounds at the ASIS bilaterally, respiratory effort normal. Cardiovascular system: S1 & S2 heard, RRR. No JVD, murmurs, rubs, gallops or clicks. No pedal edema. Gastrointestinal system: Abdomen is nondistended, soft and nontender. No organomegaly or masses felt. Normal bowel sounds heard. Central nervous system: Alert and oriented. No focal neurological deficits. Extremities: Symmetric 5 x 5 power. Skin: No rashes, lesions or ulcers.  Data Reviewed: I have personally reviewed following labs and imaging studies  CBC: Recent Labs  Lab 09/02/22 0335 09/03/22 0254 09/04/22 0846 09/05/22 0105 09/06/22 0811  WBC 8.1 10.0 9.2 8.8 7.6  HGB 8.1* 7.5* 8.4* 7.7* 8.6*  HCT 26.6* 26.4* 27.5* 26.8* 30.2*  MCV 83.4 85.7 82.6 83.8 84.1  PLT 149* 173 195 226 [id number]   Basic Metabolic Panel: Recent Labs  Lab 09/01/22 0623 09/01/22 2035 09/02/22 0335 09/03/22 0254 09/04/22 0846 09/05/22 0105 09/06/22 0811  NA 136   < > 134* 138 138 139 133*  K 3.8   < > 4.0 3.8 4.2 4.2 3.9  CL 103  --  102 106 106 103 96*  CO2 25  --  25 25 27 26 26   GLUCOSE 183*  --  118* 221* 243* 221* 207*  BUN 19  --  16 19  17 21 10   CREATININE 0.66  --  0.50 0.59 0.59 0.58 0.57  CALCIUM 7.2*  --  7.2* 7.3* 7.6* 8.0* 8.5*  MG 2.2  --  2.1 2.1 2.2  --   --   PHOS 2.0*  --  3.1 1.9* 3.5  --   --    < > = values in  this interval not displayed.   GFR: Estimated Creatinine Clearance: 72.7 mL/min (by C-G formula based on SCr of 0.57 mg/dL). Liver Function Tests: No results for input(s): "AST", "ALT", "ALKPHOS", "BILITOT", "PROT", "ALBUMIN" in the last 168 hours. No results for input(s): "LIPASE", "AMYLASE" in the last 168 hours. No results for input(s): "AMMONIA" in the last 168 hours. Coagulation Profile: No results for input(s): "INR", "PROTIME" in the last 168 hours. Cardiac Enzymes: No results for input(s): "CKTOTAL", "CKMB", "CKMBINDEX", "TROPONINI" in the last 168 hours. BNP (last 3 results) No results for input(s): "PROBNP" in the last 8760 hours. HbA1C: No results for input(s): "HGBA1C" in the last 72 hours. CBG: Recent Labs  Lab 09/06/22 1149 09/06/22 1605 09/06/22 2121 09/07/22 0628 09/07/22 1135  GLUCAP 180* 241* 191* 123* 251*   Lipid Profile: No results for input(s): "CHOL", "HDL", "LDLCALC", "TRIG", "CHOLHDL", "LDLDIRECT" in the last 72 hours. Thyroid Function Tests: No results for input(s): "TSH", "T4TOTAL", "FREET4", "T3FREE", "THYROIDAB" in the last 72 hours. Anemia Panel: No results for input(s): "VITAMINB12", "FOLATE", "FERRITIN", "TIBC", "IRON", "RETICCTPCT" in the last 72 hours. Sepsis Labs: No results for input(s): "PROCALCITON", "LATICACIDVEN" in the last 168 hours.  Recent Results (from the past 240 hour(s))  Resp panel by RT-PCR (RSV, Flu A&B, Covid) Urine, Catheterized     Status: Abnormal   Collection Time: 08/30/22  7:39 AM   Specimen: Urine, Catheterized; Nasal Swab  Result Value Ref Range Status   SARS Coronavirus 2 by RT PCR NEGATIVE NEGATIVE Final    Comment: (NOTE) SARS-CoV-2 target nucleic acids are NOT DETECTED.  The SARS-CoV-2 RNA is generally detectable in  upper respiratory specimens during the acute phase of infection. The lowest concentration of SARS-CoV-2 viral copies this assay can detect is 138 copies/mL. A negative result does not preclude SARS-Cov-2 infection and should not be used as the sole basis for treatment or other patient management decisions. A negative result may occur with  improper specimen collection/handling, submission of specimen other than nasopharyngeal swab, presence of viral mutation(s) within the areas targeted by this assay, and inadequate number of viral copies(<138 copies/mL). A negative result must be combined with clinical observations, patient history, and epidemiological information. The expected result is Negative.  Fact Sheet for Patients:  BloggerCourse.com  Fact Sheet for Healthcare Providers:  SeriousBroker.it  This test is no t yet approved or cleared by the [place] FDA and  has been authorized for detection and/or diagnosis of SARS-CoV-2 by FDA under an Emergency Use Authorization (EUA). This EUA will remain  in effect (meaning this test can be used) for the duration of the COVID-19 declaration under Section 564(b)(1) of the Act, 21 U.S.C.section 360bbb-3(b)(1), unless the authorization is terminated  or revoked sooner.       Influenza A by PCR POSITIVE (A) NEGATIVE Final   Influenza B by PCR NEGATIVE NEGATIVE Final    Comment: (NOTE) The Xpert Xpress SARS-CoV-2/FLU/RSV plus assay is intended as an aid in the diagnosis of influenza from Nasopharyngeal swab specimens and should not be used as a sole basis for treatment. Nasal washings and aspirates are unacceptable for Xpert Xpress SARS-CoV-2/FLU/RSV testing.  Fact Sheet for Patients: BloggerCourse.com  Fact Sheet for Healthcare Providers: SeriousBroker.it  This test is not yet approved or cleared by the [place] FDA and has  been authorized for detection and/or diagnosis of SARS-CoV-2 by FDA under an Emergency Use Authorization (EUA). This EUA will remain in effect (meaning this test can be used) for the duration of the  COVID-19 declaration under Section 564(b)(1) of the Act, 21 U.S.C. section 360bbb-3(b)(1), unless the authorization is terminated or revoked.     Resp Syncytial Virus by PCR NEGATIVE NEGATIVE Final    Comment: (NOTE) Fact Sheet for Patients: EntrepreneurPulse.com.au  Fact Sheet for Healthcare Providers: IncredibleEmployment.be  This test is not yet approved or cleared by the [place] FDA and has been authorized for detection and/or diagnosis of SARS-CoV-2 by FDA under an Emergency Use Authorization (EUA). This EUA will remain in effect (meaning this test can be used) for the duration of the COVID-19 declaration under Section 564(b)(1) of the Act, 21 U.S.C. section 360bbb-3(b)(1), unless the authorization is terminated or revoked.  Performed at [facility], [place] [address], [place], [place] [address]   Blood Culture (routine x 2)     Status: None   Collection Time: 08/30/22  7:55 AM   Specimen: BLOOD  Result Value Ref Range Status   Specimen Description BLOOD SITE NOT SPECIFIED  Final   Special Requests   Final    BOTTLES DRAWN AEROBIC AND ANAEROBIC Blood Culture adequate volume   Culture   Final    NO GROWTH 5 DAYS Performed at [facility], [address] [place], [place] [address]    Report Status 09/04/2022 FINAL  Final  MRSA Next Gen by PCR, Nasal     Status: None   Collection Time: 08/30/22  4:00 PM   Specimen: Nasal Mucosa; Nasal Swab  Result Value Ref Range Status   MRSA by PCR Next Gen NOT DETECTED NOT DETECTED Final    Comment: (NOTE) The GeneXpert MRSA Assay (FDA approved for NASAL specimens only), is one component of a comprehensive MRSA colonization surveillance program. It is not intended to diagnose MRSA  infection nor to guide or monitor treatment for MRSA infections. Test performance is not FDA approved in patients less than 54 years old. Performed at [facility], [place] [address], [place], [place] [address]   Urine Culture     Status: None   Collection Time: 08/30/22  4:38 PM   Specimen: In/Out Cath Urine  Result Value Ref Range Status   Specimen Description IN/OUT CATH URINE  Final   Special Requests NONE  Final   Culture   Final    NO GROWTH Performed at [facility], [place] [address], [place], [place] [address]    Report Status 08/31/2022 FINAL  Final  Blood Culture (routine x 2)     Status: None   Collection Time: 08/30/22  4:44 PM   Specimen: BLOOD LEFT HAND  Result Value Ref Range Status   Specimen Description BLOOD LEFT HAND  Final   Special Requests   Final    BOTTLES DRAWN AEROBIC AND ANAEROBIC Blood Culture adequate volume   Culture   Final    NO GROWTH 5 DAYS Performed at [facility], [place] [address] [place], [place] [address]    Report Status 09/04/2022 FINAL  Final  Culture, Respiratory w Gram Stain     Status: None   Collection Time: 08/31/22  7:24 AM   Specimen: Tracheal Aspirate; Respiratory  Result Value Ref Range Status   Specimen Description TRACHEAL ASPIRATE  Final   Special Requests NONE  Final   Gram Stain   Final    MODERATE WBC PRESENT, PREDOMINANTLY PMN FEW GRAM POSITIVE COCCI IN PAIRS IN CHAINS RARE GRAM NEGATIVE RODS    Culture   Final    MODERATE Normal  respiratory flora-no Staph aureus or Pseudomonas seen Performed at [place] [address], [place], [place] [address]    Report Status 09/02/2022 FINAL  Final  C Difficile Quick Screen w PCR reflex     Status: None   Collection Time: 08/31/22 12:42 PM   Specimen: STOOL  Result Value Ref Range Status   C Diff antigen NEGATIVE NEGATIVE Final   C Diff toxin NEGATIVE NEGATIVE Final   C Diff interpretation No C. difficile detected.  Final    Comment: Performed at  [facility], [place] [address] [place], [place] [address]     Radiology Studies: No results found.  Scheduled Meds:  acetaminophen  650 mg Oral Q6H   apixaban  5 mg Oral BID   atorvastatin  10 mg Oral QHS   carvedilol  3.125 mg Oral QHS   divalproex  500 mg Oral BID   docusate sodium  100 mg Oral BID   feeding supplement (GLUCERNA SHAKE)  237 mL Oral TID BM   insulin aspart  0-15 Units Subcutaneous TID WC   insulin glargine-yfgn  6 Units Subcutaneous Daily   multivitamin with minerals  1 tablet Oral Daily   risperiDONE  2 mg Oral q AM   risperiDONE  3 mg Oral QPM   senna  1 tablet Oral BID   spironolactone  12.5 mg Oral q AM   tamsulosin  0.4 mg Oral QODAY   Vitamin D (Ergocalciferol)  50,000 Units Oral Once per day on Tue Fri   Continuous Infusions:   LOS: 8 days   Darliss Cheney, MD [facility]  09/07/2022, 2:01 PM   *Please note that this is a verbal dictation therefore any spelling or grammatical errors are due to the "[place] [facility]" system interpretation.  Please page via [place] and do not message via secure chat for urgent patient care matters. Secure chat can be used for non urgent patient care matters.  How to contact the [facility] Attending or Consulting provider [place] or covering provider during after hours [place], for this patient?  Check the care team in [facility] and look for a) attending/consulting TRH provider listed and b) the [facility] team listed. Page or secure chat [phone]. Log into [email or web address] and use [place]'s universal password to access. If you do not have the password, please contact the hospital operator. Locate the [facility] provider you are looking for under [facility] and page to a number that you can be directly reached. If you still have difficulty reaching the provider, please page the [facility] (Director on Call) for the Hospitalists listed on amion for assistance.

## 2022-09-07 NOTE — TOC Transition Note (Signed)
Transition of Care [facility]) - CM/SW Discharge Note   Patient Details  Name: Becky Hendricks MRN: [id number] Date of Birth: 24-Dec-1945  [facility] CM/SW Contact:  Vinie Sill, LCSW Phone Number: 09/07/2022, 2:33 PM   Clinical Narrative:     CSW spoke with patient's brother,George. CSW introduced self and explained role. [name] confirmed patient is form [employer] copy and the expects the patient will return to [facility].   CSW confirmed patient is LTC and they can admit patient tomorrow.   TOC will continue to follow and assist with discharge planning.  Thurmond Butts, MSW, LCSW Clinical Social Worker    Final next level of care: Skilled Nursing Facility Barriers to Discharge:  (SNF can admit tomorrow)   Patient Goals and CMS Choice      Discharge Placement                         Discharge Plan and Services Additional resources added to the After Visit Summary for                                       Social Determinants of Health (SDOH) Interventions SDOH Screenings   Tobacco Use: Low Risk  (03/07/2022)     Readmission Risk Interventions     No data to display

## 2022-09-07 NOTE — Care Management Important Message (Signed)
Important Message  Patient Details  Name: Becky Hendricks MRN: [id number] Date of Birth: 12/21/1945   Medicare Important Message Given:  Yes     Shelda Altes 09/07/2022, 8:53 AM

## 2022-09-08 DIAGNOSIS — J9601 Acute respiratory failure with hypoxia: Secondary | ICD-10-CM | POA: Diagnosis not present

## 2022-09-08 LAB — CBC WITH DIFFERENTIAL/PLATELET
Abs Immature Granulocytes: 0.15 10*3/uL — ABNORMAL HIGH (ref 0.00–0.07)
Basophils Absolute: 0 10*3/uL (ref 0.0–0.1)
Basophils Relative: 0 %
Eosinophils Absolute: 0.2 10*3/uL (ref 0.0–0.5)
Eosinophils Relative: 3 %
HCT: 28.9 % — ABNORMAL LOW (ref 36.0–46.0)
Hemoglobin: 8.5 g/dL — ABNORMAL LOW (ref 12.0–15.0)
Immature Granulocytes: 2 %
Lymphocytes Relative: 42 %
Lymphs Abs: 3.4 10*3/uL (ref 0.7–4.0)
MCH: 24.3 pg — ABNORMAL LOW (ref 26.0–34.0)
MCHC: 29.4 g/dL — ABNORMAL LOW (ref 30.0–36.0)
MCV: 82.6 fL (ref 80.0–100.0)
Monocytes Absolute: 0.9 10*3/uL (ref 0.1–1.0)
Monocytes Relative: 10 %
Neutro Abs: 3.5 10*3/uL (ref 1.7–7.7)
Neutrophils Relative %: 43 %
Platelets: 312 10*3/uL (ref 150–400)
RBC: 3.5 MIL/uL — ABNORMAL LOW (ref 3.87–5.11)
RDW: 17.7 % — ABNORMAL HIGH (ref 11.5–15.5)
WBC: 8.2 10*3/uL (ref 4.0–10.5)
nRBC: 0.2 % (ref 0.0–0.2)

## 2022-09-08 LAB — BASIC METABOLIC PANEL
Anion gap: 8 (ref 5–15)
BUN: 18 mg/dL (ref 8–23)
CO2: 29 mmol/L (ref 22–32)
Calcium: 9.1 mg/dL (ref 8.9–10.3)
Chloride: 100 mmol/L (ref 98–111)
Creatinine, Ser: 0.68 mg/dL (ref 0.44–1.00)
GFR, Estimated: >60 mL/min (ref >60)
Glucose, Bld: 168 mg/dL — ABNORMAL HIGH (ref 70–99)
Potassium: 4.6 mmol/L (ref 3.5–5.1)
Sodium: 137 mmol/L (ref 135–145)

## 2022-09-08 LAB — GLUCOSE, CAPILLARY
Glucose-Capillary: 127 mg/dL — ABNORMAL HIGH (ref 70–99)
Glucose-Capillary: 183 mg/dL — ABNORMAL HIGH (ref 70–99)

## 2022-09-08 MED ORDER — CARVEDILOL 3.125 MG PO TABS
3.1250 mg | ORAL_TABLET | Freq: Two times a day (BID) | ORAL | Status: DC
  Administered 2022-09-08: 3.125 mg via ORAL
  Filled 2022-09-08: qty 1

## 2022-09-08 MED ORDER — CARVEDILOL 3.125 MG PO TABS: 3.1250 mg | ORAL_TABLET | Freq: Two times a day (BID) | ORAL | 0 refills | Status: DC

## 2022-09-08 NOTE — Progress Notes (Signed)
Attempted to call report to [facility] x3 with no answer

## 2022-09-08 NOTE — Discharge Summary (Signed)
Physician Discharge Summary  Becky Hendricks NLZ:[id number] DOB: 1945/10/30 DOA: 08/30/2022  PCP: Roderic Scarce, MD  Admit date: 08/30/2022 Discharge date: 09/08/2022 30 Day Unplanned Readmission Risk Score    Flowsheet Row ED to Hosp-Admission (Current) from 08/30/2022 in [facility] 4E CV SURGICAL PROGRESSIVE CARE  30 Day Unplanned Readmission Risk Score (%) 17.22 Filed at 09/08/2022 0800       This score is the patient's risk of an unplanned readmission within 30 days of being discharged (0 -100%). The score is based on dignosis, age, lab data, medications, orders, and past utilization.   Low:  0-14.9   Medium: 15-21.9   High: 22-29.9   Extreme: 30 and above          Admitted From: SNF Disposition: SNF  Recommendations for Outpatient Follow-up:  Follow up with PCP in 1-2 weeks Please obtain BMP/CBC in one week Please follow up with your PCP on the following pending results: Unresulted Labs (From admission, onward)     Start     Ordered   Pending  CBC  Once,   R       Question:  Specimen collection method  Answer:  Lab=Lab collect   Pending   Pending  Basic metabolic panel  Once,   R       Question:  Specimen collection method  Answer:  Lab=Lab collect   Pending              Home Health: None Equipment/Devices: None  Discharge Condition: Stable CODE STATUS: Full code Diet recommendation: Cardiac  Subjective: Seen and examined, she is feeling well, has no complaints and she is excited to go back to the facility.  Brief/Interim Summary: 77 y/o female admitted under PCCM in ICU on 1/18 in the context of acute hypoxemic respiratory failure and sepsis due to RLL pneumonia. Found to be positive for Flu A.  Required intubation and subsequently extubated.  Transferred under [facility] on 09/07/2022.   Significant Hospital Events: Including procedures, antibiotic start and stop dates in addition to other pertinent events   08/30/2022 intubated, blood cultures negative, Flu A  positive 1/29 trach aspirate culture OPF, c. Diff negative 1/20 extubated, reintubated overnight 1/24 Anxious/Weaning with minimal sedation, Antibiotics completed     Acute hypoxic respiratory failure secondary to influenza A pneumonia as well as community-acquired pneumonia/hemoptysis: Patient completed antibiotics for the bacterial pneumonia and Tamiflu as well.  Currently she is  now 92% on room air.  She also was noted to have some hemoptysis which has now stopped, her hemoglobin has remained stable.   Paroxysmal atrial fibrillation: Rates fairly controlled, continue Coreg as well as Eliquis.   Bipolar disorder: Stable.  Continue Risperdal and Depakote.   Anemia of chronic disease: Hemoglobin is stable.   Type 2 diabetes mellitus with hyperglycemia: Resume home medications.   Hyperlipidemia: Continue atorvastatin.   Chronic diastolic congestive heart failure: Appears euvolemic.  Discharge plan was discussed with patient and/or family member and they verbalized understanding and agreed with it.  Discharge Diagnoses:  Principal Problem:   Acute hypoxic respiratory failure (HCC) Active Problems:   Altered mental status   On mechanically assisted ventilation (HCC)    Discharge Instructions   Allergies as of 09/08/2022       Reactions   Chlorhexidine Gluconate Itching   Vancomycin    Vancomycin infusion related reaction- May need Vanc to be given at a slower rate    Acetazolamide Er Rash        Medication List  TAKE these medications    Acidophilus Caps capsule Take 1 capsule by mouth 2 (two) times daily.   apixaban 5 MG Tabs tablet Commonly known as: ELIQUIS Take 5 mg by mouth 2 (two) times daily.   atorvastatin 10 MG tablet Commonly known as: LIPITOR Take 10 mg by mouth at bedtime.   calcium carbonate 500 MG chewable tablet Commonly known as: TUMS - dosed in mg elemental calcium Chew 500 mg by mouth in the morning.   carvedilol 3.125 MG  tablet Commonly known as: COREG Take 1 tablet (3.125 mg total) by mouth 2 (two) times daily with a meal. What changed: when to take this   Combivent Respimat 20-100 MCG/ACT Aers respimat Generic drug: Ipratropium-Albuterol Inhale 1 puff into the lungs every 12 (twelve) hours.   divalproex 500 MG DR tablet Commonly known as: DEPAKOTE Take 500 mg by mouth 2 (two) times daily.   empagliflozin 10 MG Tabs tablet Commonly known as: JARDIANCE Take 10 mg by mouth in the morning.   ergocalciferol 1.25 MG (50000 UT) capsule Commonly known as: VITAMIN D2 Take 50,000 Units by mouth See admin instructions. 50,000 units once daily in the morning on Tuesday, Friday.   Eucerin Skin Calming Crea Apply 1 application  topically at bedtime. To arms and dry skin   insulin lispro 100 UNIT/ML injection Commonly known as: HUMALOG Inject 0-12 Units into the skin See admin instructions. Inject 0-12 units 3 times daily before meals and at bedtime per sliding scale: BS   70-200 : 0 units BS 201-250 : 2 units BS 251-300 : 4 units BS 301-350 : 6 units BS 351-400 : 8 units BS     > 400 : 12 units, recheck in 2 hours if BS > 350 or < 100, notify provider.   lidocaine 4 % cream Commonly known as: LMX Apply 1 application  topically in the morning. Apply to gauze and affix over right posterior auricular area under cannula tubing for cushion/pain   PSYLLIUM HUSK PO Take 0.4 g by mouth 3 (three) times daily.   risperiDONE 2 MG tablet Commonly known as: RISPERDAL Take 2 mg by mouth in the morning.   risperiDONE 3 MG tablet Commonly known as: RISPERDAL Take 3 mg by mouth at bedtime.   spironolactone 25 MG tablet Commonly known as: ALDACTONE Take 12.5 mg by mouth in the morning.   tamsulosin 0.4 MG Caps capsule Commonly known as: FLOMAX Take 1 capsule (0.4 mg total) by mouth every other day. What changed: additional instructions        Follow-up Information     [name], MD Follow up in 1  week(s).   Specialty: Internal Medicine Contact information: [place] [address] [phone]                Allergies  Allergen Reactions   Chlorhexidine Gluconate Itching   Vancomycin     Vancomycin infusion related reaction- May need Vanc to be given at a slower rate    Acetazolamide Er Rash    Consultations: CCM   Procedures/Studies: DG Abd Portable 1V  Result Date: 09/03/2022 CLINICAL DATA:  Tube placement EXAM: PORTABLE ABDOMEN - 1 VIEW COMPARISON:  None Available. FINDINGS: Limited image including the upper abdomen demonstrates feeding tube tip superimposed with the stomach below the diaphragm. IMPRESSION: Feeding tube appropriately positioned. Electronically Signed   By: Sammie Bench M.D.   On: 09/03/2022 13:53   DG CHEST PORT 1 VIEW  Result  Date: 09/01/2022 CLINICAL DATA:  History of endotracheal tube EXAM: PORTABLE CHEST 1 VIEW COMPARISON:  Chest x-ray 09/01/2022 FINDINGS: There is a new endotracheal tube with distal tip 2.9 cm above the carina. Enteric tube extends below the diaphragm. The cardiomediastinal silhouette is stable, the heart is enlarged. Bibasilar infiltrates and small pleural effusions are stable. No pneumothorax or acute fracture. IMPRESSION: 1. New endotracheal tube with distal tip 2.9 cm above the carina. 2. Stable bibasilar infiltrates and small pleural effusions. Electronically Signed   By: Darliss Cheney M.D.   On: 09/01/2022 23:51   DG Chest Port 1 View  Result Date: 09/01/2022 CLINICAL DATA:  Hypoxia EXAM: PORTABLE CHEST 1 VIEW COMPARISON:  Chest x-ray 08/31/2022 FINDINGS: Endotracheal tube has been removed. Enteric tube extends below the diaphragm. Small right pleural effusion and right basilar opacities persist. Central pulmonary vascular congestion has increased. There is no pneumothorax or acute fracture. IMPRESSION: 1. Small right pleural effusion and right basilar opacities, likely atelectasis. 2.  Increased central pulmonary vascular congestion. Electronically Signed   By: Darliss Cheney M.D.   On: 09/01/2022 21:28   DG Abd Portable 1V  Result Date: 09/01/2022 CLINICAL DATA:  NG tube placement EXAM: PORTABLE ABDOMEN - 1 VIEW COMPARISON:  Chest radiograph 08/31/2022 FINDINGS: Enteric tube tip and side-port project over the left upper quadrant. Small right pleural effusion. Left basilar atelectasis. Thoracic spine degenerative changes. Scattered gas-filled loops of bowel within the mid and lower abdomen. IMPRESSION: Enteric tube tip and side-port project over the left upper quadrant. Electronically Signed   By: Annia Belt M.D.   On: 09/01/2022 17:49   DG CHEST PORT 1 VIEW  Result Date: 08/31/2022 CLINICAL DATA:  Intubated EXAM: PORTABLE CHEST 1 VIEW COMPARISON:  08/31/2022, 4:22 a.m. FINDINGS: Right hemidiaphragm elevated. Right base opacity could represent some layering effusion. No pneumothorax. Normal pulmonary vasculature. Endotracheal tube tip 1 cm above carina. NG tube tip below the diaphragm and off x-ray. IMPRESSION: Right base opacity consistent with layering effusion, volume loss or consolidation. Electronically Signed   By: Layla Maw M.D.   On: 08/31/2022 12:20   EEG adult  Result Date: 08/31/2022 Charlsie Quest, MD     08/31/2022 10:14 AM Patient Name: Becky Hendricks MRN: [id number] Epilepsy Attending: Charlsie Quest Referring Physician/Provider: Steffanie Dunn, DO Date: 08/31/2022 Duration: 24.59 mins Patient history: 77 year old female with altered mental status.  EEG to evaluate for seizure. Level of alertness:  comatose AEDs during EEG study: Propofol Technical aspects: This EEG study was done with scalp electrodes positioned according to the 10-20 International system of electrode placement. Electrical activity was reviewed with band pass filter of 1-70Hz , sensitivity of 7 uV/mm, display speed of 48mm/sec with a 60Hz  notched filter applied as appropriate. EEG data were  recorded continuously and digitally stored.  Video monitoring was available and reviewed as appropriate. Description: EEG showed continuous generalized 3 to 5 Hz theta-delta slowing.  At [phone] and at 1006, patient was noted to have right arm tremor-like movement, predominantly after being stimulated.  Concomitant EEG before, during and after the event did not show any EEG change to suggest seizure.  Hyperventilation and photic stimulation were not performed.   ABNORMALITY - Continuous slow, generalized IMPRESSION: This study is suggestive of moderate to severe diffuse encephalopathy, nonspecific etiology.  No seizures or epileptiform discharges were seen throughout the recording. Two episodes ( at [phone] and 1191 of right arm tremor-like movement were noted predominantly after stimulation without concomitant EEG change.  These episodes  were most likely not epileptic.  Myoclonus could have similar appearance. Charlsie QuestPriyanka O Yadav   DG Chest Port 1 View  Result Date: 08/31/2022 CLINICAL DATA:  Influenza pneumonia. EXAM: PORTABLE CHEST 1 VIEW COMPARISON:  08/30/2022 at 0820 hours FINDINGS: 0422 hours Unchanged position of the endotracheal tube with tip projecting 2 cm above the carina. Partially visualized enteric tube with side port projecting below the film. Unchanged elevation of the right hemidiaphragm and right basilar atelectasis. Slightly improved aeration of the left lung base. Stable cardiac and mediastinal contours accounting for patient rotation. No significant pleural effusion or pneumothorax. IMPRESSION: Slightly improved aeration of the left lung base. Otherwise stable chest. Electronically Signed   By: Orvan FalconerWalter  Wiggins M.D.   On: 08/31/2022 08:23   CT Head Wo Contrast  Result Date: 08/30/2022 CLINICAL DATA:  Mental status change, unknown cause EXAM: CT HEAD WITHOUT CONTRAST TECHNIQUE: Contiguous axial images were obtained from the base of the skull through the vertex without intravenous contrast.  RADIATION DOSE REDUCTION: This exam was performed according to the departmental dose-optimization program which includes automated exposure control, adjustment of the mA and/or kV according to patient size and/or use of iterative reconstruction technique. COMPARISON:  CT head 04/01/2021. FINDINGS: Brain: No evidence of acute large vascular territory infarction, hemorrhage, hydrocephalus, extra-axial collection or mass lesion/mass effect. Patchy white matter hypodensities, nonspecific but compatible with chronic microvascular ischemic disease. Vascular: No hyperdense vessel identified. Skull: No acute fracture. Sinuses/Orbits: Paranasal sinus mucosal thickening. Air-fluid levels. No acute orbital findings. Other: No mastoid effusions. IMPRESSION: No evidence of acute intracranial abnormality. Electronically Signed   By: Feliberto HartsFrederick S Jones M.D.   On: 08/30/2022 13:18   DG Abdomen 1 View  Result Date: 08/30/2022 CLINICAL DATA:  Orogastric tube placement. EXAM: ABDOMEN - 1 VIEW COMPARISON:  Radiographs 03/29/2021.  CT 03/29/2021. FINDINGS: 0951 hours. Single view of the abdomen demonstrates the tip of the orogastric tube overlying the L2 vertebral body, likely in the mid to distal stomach. The visualized bowel gas pattern is normal. Chronic asymmetric elevation of the right hemidiaphragm with probable mild bibasilar atelectasis. Mild thoracolumbar spondylosis noted. Telemetry leads overlie the lower chest and upper abdomen. IMPRESSION: Orogastric tube tip likely in the mid to distal stomach. Electronically Signed   By: Carey BullocksWilliam  Veazey M.D.   On: 08/30/2022 09:59   DG Chest Port 1 View  Result Date: 08/30/2022 CLINICAL DATA:  ETT/NG tube placement EXAM: PORTABLE CHEST 1 VIEW COMPARISON:  Chest x-ray dated December 09, 2021 FINDINGS: Interval placement of ET tube with tip proximally 2 cm from the carina. NG tube is partially visualized coursing below the diaphragm. Cardiac and mediastinal contours are unchanged.  Elevation of the right hemidiaphragm and bibasilar atelectasis. No large pleural effusion or evidence of pneumothorax. IMPRESSION: 1. Interval placement of ET tube with tip proximally 2 cm from the carina. 2. NG tube is partially visualized coursing below the diaphragm. Electronically Signed   By: Allegra LaiLeah  Strickland M.D.   On: 08/30/2022 08:32     Discharge Exam: Vitals:   09/08/22 0300 09/08/22 0718  BP: (!) 171/71 (!) 173/77  Pulse: 73 (!) 111  Resp: 15 19  Temp:  98.4 F (36.9 C)  SpO2: 91% 90%   Vitals:   09/07/22 2024 09/08/22 0300 09/08/22 0619 09/08/22 0718  BP: (!) 142/59 (!) 171/71  (!) 173/77  Pulse: 94 73  (!) 111  Resp: 20 15  19   Temp: 98.4 F (36.9 C)   98.4 F (36.9 C)  TempSrc: Oral  Oral  SpO2: 91% 91%  90%  Weight:  101.2 kg 100.7 kg   Height:        General: Pt is alert, awake, not in acute distress Cardiovascular: RRR, S1/S2 +, no rubs, no gallops Respiratory: CTA bilaterally, no wheezing, no rhonchi Abdominal: Soft, NT, ND, bowel sounds + Extremities: no edema, no cyanosis    The results of significant diagnostics from this hospitalization (including imaging, microbiology, ancillary and laboratory) are listed below for reference.     Microbiology: Recent Results (from the past 240 hour(s))  Resp panel by RT-PCR (RSV, Flu A&B, Covid) Urine, Catheterized     Status: Abnormal   Collection Time: 08/30/22  7:39 AM   Specimen: Urine, Catheterized; Nasal Swab  Result Value Ref Range Status   SARS Coronavirus 2 by RT PCR NEGATIVE NEGATIVE Final    Comment: (NOTE) SARS-CoV-2 target nucleic acids are NOT DETECTED.  The SARS-CoV-2 RNA is generally detectable in upper respiratory specimens during the acute phase of infection. The lowest concentration of SARS-CoV-2 viral copies this assay can detect is 138 copies/mL. A negative result does not preclude SARS-Cov-2 infection and should not be used as the sole basis for treatment or other patient management  decisions. A negative result may occur with  improper specimen collection/handling, submission of specimen other than nasopharyngeal swab, presence of viral mutation(s) within the areas targeted by this assay, and inadequate number of viral copies(<138 copies/mL). A negative result must be combined with clinical observations, patient history, and epidemiological information. The expected result is Negative.  Fact Sheet for Patients:  EntrepreneurPulse.com.au  Fact Sheet for Healthcare Providers:  IncredibleEmployment.be  This test is no t yet approved or cleared by the [place] FDA and  has been authorized for detection and/or diagnosis of SARS-CoV-2 by FDA under an Emergency Use Authorization (EUA). This EUA will remain  in effect (meaning this test can be used) for the duration of the COVID-19 declaration under Section 564(b)(1) of the Act, 21 U.S.C.section 360bbb-3(b)(1), unless the authorization is terminated  or revoked sooner.       Influenza A by PCR POSITIVE (A) NEGATIVE Final   Influenza B by PCR NEGATIVE NEGATIVE Final    Comment: (NOTE) The Xpert Xpress SARS-CoV-2/FLU/RSV plus assay is intended as an aid in the diagnosis of influenza from Nasopharyngeal swab specimens and should not be used as a sole basis for treatment. Nasal washings and aspirates are unacceptable for Xpert Xpress SARS-CoV-2/FLU/RSV testing.  Fact Sheet for Patients: EntrepreneurPulse.com.au  Fact Sheet for Healthcare Providers: IncredibleEmployment.be  This test is not yet approved or cleared by the [place] FDA and has been authorized for detection and/or diagnosis of SARS-CoV-2 by FDA under an Emergency Use Authorization (EUA). This EUA will remain in effect (meaning this test can be used) for the duration of the COVID-19 declaration under Section 564(b)(1) of the Act, 21 U.S.C. section 360bbb-3(b)(1), unless  the authorization is terminated or revoked.     Resp Syncytial Virus by PCR NEGATIVE NEGATIVE Final    Comment: (NOTE) Fact Sheet for Patients: EntrepreneurPulse.com.au  Fact Sheet for Healthcare Providers: IncredibleEmployment.be  This test is not yet approved or cleared by the [place] FDA and has been authorized for detection and/or diagnosis of SARS-CoV-2 by FDA under an Emergency Use Authorization (EUA). This EUA will remain in effect (meaning this test can be used) for the duration of the COVID-19 declaration under Section 564(b)(1) of the Act, 21 U.S.C. section 360bbb-3(b)(1), unless the authorization is terminated or  revoked.  Performed at [facility], [address], [place], [place] [address]   Blood Culture (routine x 2)     Status: None   Collection Time: 08/30/22  7:55 AM   Specimen: BLOOD  Result Value Ref Range Status   Specimen Description BLOOD SITE NOT SPECIFIED  Final   Special Requests   Final    BOTTLES DRAWN AEROBIC AND ANAEROBIC Blood Culture adequate volume   Culture   Final    NO GROWTH 5 DAYS Performed at [facility], [address] [place], [place] [address]    Report Status 09/04/2022 FINAL  Final  MRSA Next Gen by PCR, Nasal     Status: None   Collection Time: 08/30/22  4:00 PM   Specimen: Nasal Mucosa; Nasal Swab  Result Value Ref Range Status   MRSA by PCR Next Gen NOT DETECTED NOT DETECTED Final    Comment: (NOTE) The GeneXpert MRSA Assay (FDA approved for NASAL specimens only), is one component of a comprehensive MRSA colonization surveillance program. It is not intended to diagnose MRSA infection nor to guide or monitor treatment for MRSA infections. Test performance is not FDA approved in patients less than 66 years old. Performed at [facility], [address], [place], [place] [address]   Urine Culture     Status: None   Collection Time: 08/30/22  4:38 PM   Specimen:  In/Out Cath Urine  Result Value Ref Range Status   Specimen Description IN/OUT CATH URINE  Final   Special Requests NONE  Final   Culture   Final    NO GROWTH Performed at [facility], [address] [place], [place] [address]    Report Status 08/31/2022 FINAL  Final  Blood Culture (routine x 2)     Status: None   Collection Time: 08/30/22  4:44 PM   Specimen: BLOOD LEFT HAND  Result Value Ref Range Status   Specimen Description BLOOD LEFT HAND  Final   Special Requests   Final    BOTTLES DRAWN AEROBIC AND ANAEROBIC Blood Culture adequate volume   Culture   Final    NO GROWTH 5 DAYS Performed at [facility], [address] [place], [place] [address]    Report Status 09/04/2022 FINAL  Final  Culture, Respiratory w Gram Stain     Status: None   Collection Time: 08/31/22  7:24 AM   Specimen: Tracheal Aspirate; Respiratory  Result Value Ref Range Status   Specimen Description TRACHEAL ASPIRATE  Final   Special Requests NONE  Final   Gram Stain   Final    MODERATE WBC PRESENT, PREDOMINANTLY PMN FEW GRAM POSITIVE COCCI IN PAIRS IN CHAINS RARE GRAM NEGATIVE RODS    Culture   Final    MODERATE Normal respiratory flora-no Staph aureus or Pseudomonas seen Performed at [facility], [address], [place], [place] [address]    Report Status 09/02/2022 FINAL  Final  C Difficile Quick Screen w PCR reflex     Status: None   Collection Time: 08/31/22 12:42 PM   Specimen: STOOL  Result Value Ref Range Status   C Diff antigen NEGATIVE NEGATIVE Final   C Diff toxin NEGATIVE NEGATIVE Final   C Diff interpretation No C. difficile detected.  Final    Comment: Performed at [facility], [address] [place], [place] [address]     Labs: BNP (last 3 results) Recent Labs  08/30/22 0743  BNP 111.3*   Basic Metabolic Panel: Recent Labs  Lab 09/02/22 0335 09/03/22 0254 09/04/22 0846 09/05/22 0105 09/06/22 0811 09/08/22 0035  NA 134* 138 138 139  133* 137  K 4.0 3.8 4.2 4.2 3.9 4.6  CL 102 106 106 103 96* 100  CO2 25 25 27 26 26 29   GLUCOSE 118* 221* 243* 221* 207* 168*  BUN 16 19 17 21 10 18   CREATININE 0.50 0.59 0.59 0.58 0.57 0.68  CALCIUM 7.2* 7.3* 7.6* 8.0* 8.5* 9.1  MG 2.1 2.1 2.2  --   --   --   PHOS 3.1 1.9* 3.5  --   --   --    Liver Function Tests: No results for input(s): "AST", "ALT", "ALKPHOS", "BILITOT", "PROT", "ALBUMIN" in the last 168 hours. No results for input(s): "LIPASE", "AMYLASE" in the last 168 hours. No results for input(s): "AMMONIA" in the last 168 hours. CBC: Recent Labs  Lab 09/03/22 0254 09/04/22 0846 09/05/22 0105 09/06/22 0811 09/08/22 0035  WBC 10.0 9.2 8.8 7.6 8.2  NEUTROABS  --   --   --   --  3.5  HGB 7.5* 8.4* 7.7* 8.6* 8.5*  HCT 26.4* 27.5* 26.8* 30.2* 28.9*  MCV 85.7 82.6 83.8 84.1 82.6  PLT 173 195 226 287 312   Cardiac Enzymes: No results for input(s): "CKTOTAL", "CKMB", "CKMBINDEX", "TROPONINI" in the last 168 hours. BNP: Invalid input(s): "POCBNP" CBG: Recent Labs  Lab 09/07/22 0628 09/07/22 1135 09/07/22 1707 09/07/22 2026 09/08/22 0617  GLUCAP 123* 251* 166* 215* 127*   D-Dimer No results for input(s): "DDIMER" in the last 72 hours. Hgb A1c No results for input(s): "HGBA1C" in the last 72 hours. Lipid Profile No results for input(s): "CHOL", "HDL", "LDLCALC", "TRIG", "CHOLHDL", "LDLDIRECT" in the last 72 hours. Thyroid function studies No results for input(s): "TSH", "T4TOTAL", "T3FREE", "THYROIDAB" in the last 72 hours.  Invalid input(s): "FREET3" Anemia work up No results for input(s): "VITAMINB12", "FOLATE", "FERRITIN", "TIBC", "IRON", "RETICCTPCT" in the last 72 hours. Urinalysis    Component Value Date/Time   COLORURINE YELLOW 08/30/2022 0816   APPEARANCEUR CLEAR 08/30/2022 0816   LABSPEC 1.030 08/30/2022 0816   PHURINE 5.0 08/30/2022 0816   GLUCOSEU >=500 (A) 08/30/2022 0816   HGBUR NEGATIVE 08/30/2022 0816   BILIRUBINUR NEGATIVE 08/30/2022  0816   KETONESUR 80 (A) 08/30/2022 0816   PROTEINUR NEGATIVE 08/30/2022 0816   NITRITE NEGATIVE 08/30/2022 0816   LEUKOCYTESUR NEGATIVE 08/30/2022 0816   Sepsis Labs Recent Labs  Lab 09/04/22 0846 09/05/22 0105 09/06/22 0811 09/08/22 0035  WBC 9.2 8.8 7.6 8.2   Microbiology Recent Results (from the past 240 hour(s))  Resp panel by RT-PCR (RSV, Flu A&B, Covid) Urine, Catheterized     Status: Abnormal   Collection Time: 08/30/22  7:39 AM   Specimen: Urine, Catheterized; Nasal Swab  Result Value Ref Range Status   SARS Coronavirus 2 by RT PCR NEGATIVE NEGATIVE Final    Comment: (NOTE) SARS-CoV-2 target nucleic acids are NOT DETECTED.  The SARS-CoV-2 RNA is generally detectable in upper respiratory specimens during the acute phase of infection. The lowest concentration of SARS-CoV-2 viral copies this assay can detect is 138 copies/mL. A negative result does not preclude SARS-Cov-2 infection and should not be used as the sole basis for treatment or other patient management decisions. A negative result may occur with  improper specimen collection/handling, submission of specimen other than nasopharyngeal swab, presence of viral mutation(s) within the areas targeted by this assay,  and inadequate number of viral copies(<138 copies/mL). A negative result must be combined with clinical observations, patient history, and epidemiological information. The expected result is Negative.  Fact Sheet for Patients:  BloggerCourse.com  Fact Sheet for Healthcare Providers:  SeriousBroker.it  This test is no t yet approved or cleared by the [place] FDA and  has been authorized for detection and/or diagnosis of SARS-CoV-2 by FDA under an Emergency Use Authorization (EUA). This EUA will remain  in effect (meaning this test can be used) for the duration of the COVID-19 declaration under Section 564(b)(1) of the Act, 21 U.S.C.section  360bbb-3(b)(1), unless the authorization is terminated  or revoked sooner.       Influenza A by PCR POSITIVE (A) NEGATIVE Final   Influenza B by PCR NEGATIVE NEGATIVE Final    Comment: (NOTE) The Xpert Xpress SARS-CoV-2/FLU/RSV plus assay is intended as an aid in the diagnosis of influenza from Nasopharyngeal swab specimens and should not be used as a sole basis for treatment. Nasal washings and aspirates are unacceptable for Xpert Xpress SARS-CoV-2/FLU/RSV testing.  Fact Sheet for Patients: BloggerCourse.com  Fact Sheet for Healthcare Providers: SeriousBroker.it  This test is not yet approved or cleared by the [place] FDA and has been authorized for detection and/or diagnosis of SARS-CoV-2 by FDA under an Emergency Use Authorization (EUA). This EUA will remain in effect (meaning this test can be used) for the duration of the COVID-19 declaration under Section 564(b)(1) of the Act, 21 U.S.C. section 360bbb-3(b)(1), unless the authorization is terminated or revoked.     Resp Syncytial Virus by PCR NEGATIVE NEGATIVE Final    Comment: (NOTE) Fact Sheet for Patients: BloggerCourse.com  Fact Sheet for Healthcare Providers: SeriousBroker.it  This test is not yet approved or cleared by the [place] FDA and has been authorized for detection and/or diagnosis of SARS-CoV-2 by FDA under an Emergency Use Authorization (EUA). This EUA will remain in effect (meaning this test can be used) for the duration of the COVID-19 declaration under Section 564(b)(1) of the Act, 21 U.S.C. section 360bbb-3(b)(1), unless the authorization is terminated or revoked.  Performed at [facility], [address] [place], [place] [address]   Blood Culture (routine x 2)     Status: None   Collection Time: 08/30/22  7:55 AM   Specimen: BLOOD  Result Value Ref Range Status   Specimen  Description BLOOD SITE NOT SPECIFIED  Final   Special Requests   Final    BOTTLES DRAWN AEROBIC AND ANAEROBIC Blood Culture adequate volume   Culture   Final    NO GROWTH 5 DAYS Performed at [facility], [address], [place], [place] [address]    Report Status 09/04/2022 FINAL  Final  MRSA Next Gen by PCR, Nasal     Status: None   Collection Time: 08/30/22  4:00 PM   Specimen: Nasal Mucosa; Nasal Swab  Result Value Ref Range Status   MRSA by PCR Next Gen NOT DETECTED NOT DETECTED Final    Comment: (NOTE) The GeneXpert MRSA Assay (FDA approved for NASAL specimens only), is one component of a comprehensive MRSA colonization surveillance program. It is not intended to diagnose MRSA infection nor to guide or monitor treatment for MRSA infections. Test performance is not FDA approved in patients less than 60 years old. Performed at [facility], [address], [place], [place] [address]   Urine Culture     Status: None   Collection Time:  08/30/22  4:38 PM   Specimen: In/Out Cath Urine  Result Value Ref Range Status   Specimen Description IN/OUT CATH URINE  Final   Special Requests NONE  Final   Culture   Final    NO GROWTH Performed at [facility], [address], [place], [place] [address]    Report Status 08/31/2022 FINAL  Final  Blood Culture (routine x 2)     Status: None   Collection Time: 08/30/22  4:44 PM   Specimen: BLOOD LEFT HAND  Result Value Ref Range Status   Specimen Description BLOOD LEFT HAND  Final   Special Requests   Final    BOTTLES DRAWN AEROBIC AND ANAEROBIC Blood Culture adequate volume   Culture   Final    NO GROWTH 5 DAYS Performed at [facility], [address] [place], [place] [address]    Report Status 09/04/2022 FINAL  Final  Culture, Respiratory w Gram Stain     Status: None   Collection Time: 08/31/22  7:24 AM   Specimen: Tracheal Aspirate; Respiratory  Result Value Ref Range Status   Specimen Description  TRACHEAL ASPIRATE  Final   Special Requests NONE  Final   Gram Stain   Final    MODERATE WBC PRESENT, PREDOMINANTLY PMN FEW GRAM POSITIVE COCCI IN PAIRS IN CHAINS RARE GRAM NEGATIVE RODS    Culture   Final    MODERATE Normal respiratory flora-no Staph aureus or Pseudomonas seen Performed at [facility], [address], [place], [place] [address]    Report Status 09/02/2022 FINAL  Final  C Difficile Quick Screen w PCR reflex     Status: None   Collection Time: 08/31/22 12:42 PM   Specimen: STOOL  Result Value Ref Range Status   C Diff antigen NEGATIVE NEGATIVE Final   C Diff toxin NEGATIVE NEGATIVE Final   C Diff interpretation No C. difficile detected.  Final    Comment: Performed at [facility], [address] [place], [place] [address]     Time coordinating discharge: Over 30 minutes  SIGNED:   Hughie Closs, MD  [facility] 09/08/2022, 10:05 AM *Please note that this is a verbal dictation therefore any spelling or grammatical errors are due to the "Dragon Medical One" system interpretation. If 7PM-7AM, please contact night-coverage [email or web address]

## 2022-09-08 NOTE — Progress Notes (Signed)
[facility] transporting pt to [facility]

## 2022-09-08 NOTE — TOC Transition Note (Signed)
Transition of Care [facility]) - CM/SW Discharge Note   Patient Details  Name: Becky Hendricks MRN: [id number] Date of Birth: [birth year]  Transition of Care [facility]) CM/SW Contact:  Loreta Ave, [place] Phone Number: 09/08/2022, 11:06 AM   Clinical Narrative:     [facility] will DC to: [name] Anticipated DC date: 09/08/22 Family notified: [name]. Transport by: [name]   Per MD patient ready for DC to [name]. RN to call report prior to discharge [name] [phone]. RN, patient, patient's family, and facility notified of DC. Discharge Summary and FL2 sent to facility. DC packet on chart. Ambulance transport requested for patient.   CSW will sign off for now as social work intervention is no longer needed. Please consult [place] again if new needs arise.     Final next level of care: Skilled Nursing Facility Barriers to Discharge:  (SNF can admit tomorrow)   Patient Goals and CMS Choice      Discharge Placement                         Discharge Plan and Services Additional resources added to the After Visit Summary for                                       Social Determinants of Health (SDOH) Interventions SDOH Screenings   Tobacco Use: Low Risk  (03/07/2022)     Readmission Risk Interventions     No data to display

## 2022-09-24 ENCOUNTER — Non-Acute Institutional Stay: Payer: Medicare Other | Admitting: Family Medicine

## 2022-09-24 ENCOUNTER — Encounter: Payer: Self-pay | Admitting: Family Medicine

## 2022-09-24 VITALS — BP 128/80 | HR 72 | Temp 98.0°F | Resp 20 | Wt 225.4 lb

## 2022-09-24 DIAGNOSIS — E440 Moderate protein-calorie malnutrition: Secondary | ICD-10-CM | POA: Insufficient documentation

## 2022-09-24 DIAGNOSIS — J9611 Chronic respiratory failure with hypoxia: Secondary | ICD-10-CM

## 2022-09-24 DIAGNOSIS — G4733 Obstructive sleep apnea (adult) (pediatric): Secondary | ICD-10-CM

## 2022-09-24 DIAGNOSIS — R532 Functional quadriplegia: Secondary | ICD-10-CM

## 2022-09-24 NOTE — Progress Notes (Signed)
[facility] Palliative Care Consult Note Telephone: [phone]  Fax: [phone]   Date of encounter: 09/24/22 10:58 AM PATIENT NAME: Becky Hendricks [address] [place] [address]   [phone] (home)  DOB: 1946/01/19 MRN: [id number] PRIMARY CARE PROVIDER:    [name], MD,  [address] [place] [address] [phone]  REFERRING PROVIDER:   [name], MD [address] [place],  [place] [address] [phone]  RESPONSIBLE PARTY:    Contact Information     Name Relation Home Work Mobile   Daylah, Gartman Brother [phone]          I met face to face with patient in [place] and rehab facility. Palliative Care was asked to follow this patient by consultation request of  Dr Shella Maxim NP to address advance care planning and complex medical decision making. This is a readmission initial SNF visit.          ASSESSMENT, SYMPTOM MANAGEMENT AND PLAN / RECOMMENDATIONS:   Chronic Respiratory Failure with Hypoxia and Hypercapnia/OSA treated with BiPaP Pt has intermittent elevations of CO2, has hx of cardiac arrest and given her severe comorbidities of morbid obesity, chronic diastolic CHF and IDDM with stated goals of full code/full intervention would recommend compliance with nighttime use of BiPap per settings established by Pulmonology. If pt remains unable to tolerate Bi-Pap would titrate O2 to maintain sats closer to 92-94% without increased work of breathing and monitor for increased sedation, new onset confusion and poor ability to arouse pt.   Functional Quadriplegia In an effort to prevent skin breakdown and promote drainage would recommend pt be OOB for at least 2 hours BID and position changed while in bed. Pt has had recent sepsis and osteomyelitis, IDDM and low albumin is at, risk for skin breakdown and poor wound  healing. Reposition Q 2 hours when awake during the day or consider use of low pressure air mattress.   Moderate protein calorie malnutrition Weight has been increasing gradually without visible signs of edema or volume overload.   Last Albumin 2.7 on 08/30/22 improved from 2.0 on 05/01/21. Recommend nutrition consult to address best supplement to improve Albumin with least amount of calories/carbs to increase sugars.  Follow up Palliative Care Visit: Palliative care will continue to follow for complex medical decision making, advance care planning, and clarification of goals. Return 4 weeks or prn.    This visit was coded based on medical decision making (MDM).  PPS: 40%  HOSPICE ELIGIBILITY/DIAGNOSIS: TBD  Chief Complaint:  Palliative Care is continuing to follow patient for chronic medical management in setting of functional quadriplegia with chronic respiratory failure with recent admission for sepsis due to pneumonia from Influenza A and d/c'd back to [place].  HISTORY OF PRESENT ILLNESS:  Becky Hendricks is a 77 y.o. year old female with Chronic hypoxic/hypercapneic respiratory failure with recent acute phase provoked by influenza associated PNA. She also has hx of OSA on nighttime BiPap, atrial fibrillation, chronic diastolic CHF,  Insulin dependent DM with hyperlipidemia and complicated by Bipolar Affective Disorder. Pt is known to Palliative Service but this is an initial visit following recent hospitalization for acute on chronic respiratory failure and sepsis due to RLL pneumonia who was positive for influenza A.  She required intubation and was subsequently weaned off the ventilator and sent back to her SNF.  She has since signed a full code  MOST with full scope intervention except for feeding tube for a defined trial period as of 09/13/22. Facility staff indicates pt can do some things for herself but chooses not to and asks someone else to do it.  She is functionally  quadriplegic, incontinent of bowel and bladder, bedbound to chairbound. She eats well. She states wanting to get up OOB.  Denies fever, productive cough or worsening SOB. She has issues with alternating loose stool and constipation. She denies wearing BiPap at night and only chronically uses O2.  Majority of blood sugars are running 120-170 with the majority of elevations being in the 220-240 range and an isolated 300.  The elevations are usually before dinner and at bedtime.  No hypoglycemic episodes are noted and blood sugars have improved over the last 2 weeks.  History obtained from review of EMR, discussion with facility staff and/or Becky Hendricks.   Component Ref Range & Units 3 wk ago (09/02/22) 3 wk ago (09/01/22) 3 wk ago (09/01/22) 3 wk ago (09/01/22) 3 wk ago (08/31/22) 3 wk ago (08/31/22) 3 wk ago (08/31/22)  pH, Arterial 7.35 - 7.45 7.417 7.230 Low  7.305 Low   7.446    pCO2 arterial 32 - 48 mmHg 43.4 75.4 High Panic  58.8 High   33.7    pO2, Arterial 83 - 108 mmHg 66 Low  158 High  59 Low   108    Bicarbonate 20.0 - 28.0 mmol/L 28.0 31.6 High  29.3 High   23.1    TCO2 22 - 32 mmol/L 29 34 High  31  24    O2 Saturation % 93 99 87  98    Acid-Base Excess 0.0 - 2.0 mmol/L 3.0 High  3.0 High  2.0      Sodium 135 - 145 mmol/L 138 138 138 136 136 138   Potassium 3.5 - 5.1 mmol/L 4.3 4.3 4.2 3.8 3.4 Low  3.2 Low    Calcium, Ion 1.15 - 1.40 mmol/L 1.08 Low  1.14 Low  1.13 Low   1.13 Low     HCT 36.0 - 46.0 % 26.0 Low  29.0 Low  28.0 Low   25.0 Low   28.3 Low   Hemoglobin 12.0 - 15.0 g/dL 8.8 Low  9.9 Low  9.5 Low   8.5 Low   8.5 Low   Patient temperature  98.4 F 98.4 F 98.4 F  99.3 F    Sample type  ARTERIAL ARTERIAL ARTERIAL  ARTERIAL    Resulting Agency  [facility]     08/30/22 BNP elevated at 111.3, elevated troponin I (HS) at 27 without serial measurement      08/30/22 TSH normal at 1.045 08/30/22 Respiratory  virus panel negative for influenza B, RSV, and SARS-CoV-2, positive for influenza A 09/04/22 Mag normal at 2.2, Phos at 3.5  CT head w/o contrast 08/30/22: IMPRESSION: No evidence of acute intracranial abnormality.  Portable CXR 09/01/22: IMPRESSION: 1. New endotracheal tube with distal tip 2.9 cm above the carina. 2. Stable bibasilar infiltrates and small pleural effusions.  Last echo 04/03/21: LV EF 60-65, normal size and no regional wall motion abnormalities.  Elevated left atrial pressure.  Moderate asymmetric left ventricular hypertrophy of the basal-septal segment. Grade II Diastolic Dysfunction Right ventricule is poorly visualized but grossly normal size and  systolic function. Moderately elevated pulmonary artery systolic  pressure. The estimated RVSP  is elevated at 47.7 mmHg.      Latest Ref Rng & Units 09/08/2022   12:35 AM 09/06/2022    8:11 AM 09/05/2022    1:05 AM  CBC  WBC 4.0 - 10.5 K/uL 8.2  7.6  8.8   Hemoglobin 12.0 - 15.0 g/dL 8.5  8.6  7.7   Hematocrit 36.0 - 46.0 % 28.9  30.2  26.8   Platelets 150 - 400 K/uL 312  287  226       Latest Ref Rng & Units 09/08/2022   12:35 AM 09/06/2022    8:11 AM 09/05/2022    1:05 AM  CMP  Glucose 70 - 99 mg/dL 168  207  221   BUN 8 - 23 mg/dL 18  10  21   $ Creatinine 0.44 - 1.00 mg/dL 0.68  0.57  0.58   Sodium 135 - 145 mmol/L 137  133  139   Potassium 3.5 - 5.1 mmol/L 4.6  3.9  4.2   Chloride 98 - 111 mmol/L 100  96  103   CO2 22 - 32 mmol/L 29  26  26   $ Calcium 8.9 - 10.3 mg/dL 9.1  8.5  8.0       Latest Ref Rng & Units 08/30/2022    7:43 AM 12/09/2021    8:24 PM 05/01/2021    3:08 AM  Hepatic Function  Total Protein 6.5 - 8.1 g/dL 6.3  6.1  5.0   Albumin 3.5 - 5.0 g/dL 2.7  3.0  2.0   AST 15 - 41 U/L 27  11  10   $ ALT 0 - 44 U/L 12  13  9   $ Alk Phosphatase 38 - 126 U/L 29  31  28   $ Total Bilirubin 0.3 - 1.2 mg/dL 1.3  0.7  0.4      I reviewed EMR for available labs, medications, imaging, studies and related  documents.  No new records since last visit/Records reviewed and summarized above.   ROS General: NAD ENMT: denies dysphagia Cardiovascular: denies chest pain, denies DOE Pulmonary: denies cough, denies increased SOB above baseline Abdomen: endorses good appetite, states alternating loose stool and constipation GU: denies dysuria MSK:  denies increased weakness, no falls reported Skin: denies rashes or wounds Neurological: denies pain, denies insomnia Psych: Endorses anxiety and depression over not being able to get OOB   Physical Exam: Current and past weights:  225.4 lbs on 09/17/22, 222 lbs as of 09/08/22 Constitutional: NAD General: disheveled, obese  ENMT: intact hearing, oral mucous membranes moist, dentition intact CV: S1S2, RRR, no LE edema Pulmonary: CTAB but diminished in bases, no increased work of breathing, no cough, on O2 @2$ .5 L [place] Abdomen: normo active BS + 4 quadrants, soft and non tender, no ascites GU: deferred MSK: no sarcopenia, moves arm, feet contracted slightly, bedbound, functionally quadriplegic Skin: warm and dry, no rashes or wounds on visible skin Psych: Mod anxious affect, A and O x 3 Hem/lymph/immuno: no widespread bruising  CURRENT PROBLEM LIST:  Patient Active Problem List   Diagnosis Date Noted   On mechanically assisted ventilation ([place]) 08/31/2022   Acute hypoxic respiratory failure ([place]) 08/30/2022   Altered mental status    Non compliance with medical treatment    Chronic respiratory failure with hypoxia and hypercapnia ([place]) 04/21/2021   OSA treated with BiPAP    Hyponatremia 03/29/2021   Functional quadriplegia ([place]) 03/29/2021   Bipolar affective disorder, currently manic, mild ([place]) 11/19/2020   Constipation 11/18/2020  MRSA bacteremia    Mixed diabetic hyperlipidemia associated with type 2 diabetes mellitus ([place]) 11/17/2020   Type 2 diabetes mellitus with hyperglycemia, with long-term current use of insulin ([place]) 11/17/2020    Immunization due 07/26/2020   Psychosis ([place])    Bipolar disorder ([place]) 04/27/2020   Fall 02/11/2020   Chronic diastolic CHF (congestive heart failure) ([place]) 01/22/2020   Acute osteomyelitis of pelvic region and thigh [facility])    Palliative care by specialist    Goals of care, counseling/discussion    Edema of upper extremity    Hyperphosphatemia    Sacral wound    Abscess of multiple sites of buttock 12/03/2019   AF (paroxysmal atrial fibrillation) ([place]) 12/03/2019   Pressure injury of skin 09/10/2019   Morbid obesity with BMI of 40.0-44.9, adult ([place]) 02/27/2018   PAST MEDICAL HISTORY:  Active Ambulatory Problems    Diagnosis Date Noted   Pressure injury of skin 09/10/2019   Abscess of multiple sites of buttock 12/03/2019   AF (paroxysmal atrial fibrillation) ([place]) 12/03/2019   Sacral wound    Hyperphosphatemia    Edema of upper extremity    Palliative care by specialist    Goals of care, counseling/discussion    Acute osteomyelitis of pelvic region and thigh ([place])    Chronic diastolic CHF (congestive heart failure) ([place]) 01/22/2020   Fall 02/11/2020   Morbid obesity with BMI of 40.0-44.9, adult ([place]) 02/27/2018   Bipolar disorder ([place]) 04/27/2020   Psychosis ([place])    Immunization due 07/26/2020   Mixed diabetic hyperlipidemia associated with type 2 diabetes mellitus ([place]) 11/17/2020   Type 2 diabetes mellitus with hyperglycemia, with long-term current use of insulin ([place]) 11/17/2020   Constipation 11/18/2020   MRSA bacteremia    Bipolar affective disorder, currently manic, mild ([place]) 11/19/2020   Hyponatremia 03/29/2021   Functional quadriplegia ([place]) 03/29/2021   Chronic respiratory failure with hypoxia and hypercapnia ([place]) 04/21/2021   OSA treated with BiPAP    Non compliance with medical treatment    Altered mental status    Acute hypoxic respiratory failure ([place]) 08/30/2022   On mechanically assisted ventilation ([place]) 08/31/2022   Resolved Ambulatory Problems     Diagnosis Date Noted   Acute respiratory distress syndrome (ARDS) due to COVID-19 virus ([place]) 09/07/2019   AKI (acute kidney injury) ([place])    Respiratory failure with hypoxia ([place]) 09/15/2019   Pneumonia due to COVID-19 virus Q000111Q   Metabolic encephalopathy Q000111Q   Urinary tract infection without hematuria 09/15/2019   Severe sepsis ([place]) 12/03/2019   Osteomyelitis ([place]) 12/03/2019   Hypoxia    Cardiac arrest ([place])    Encounter for central line placement    Acute respiratory failure (HCC)    Drug rash    Acute respiratory failure with hypoxemia ([place]) 01/19/2020   Acute pulmonary edema (HCC)    SIRS (systemic inflammatory response syndrome) ([place]) 11/17/2020   Nausea and vomiting 11/17/2020   Encephalopathy acute 11/18/2020   Acute respiratory failure with hypoxia ([place]) 03/29/2021   Pneumonia of lower lobe due to infectious organism 03/29/2021   Colitis 03/29/2021   Acute respiratory failure ([place]) 03/29/2021   Acute on chronic respiratory failure with hypoxia and hypercapnia (HCC)    Acute and chronic respiratory failure with hypercapnia ([place]) 04/10/2021   Past Medical History:  Diagnosis Date   Atrial fibrillation (HCC)    Clostridium difficile diarrhea 08/02/2019   Dehydration    Essential hypertension    Morbid obesity ([place])    Type 2 diabetes  mellitus ([place])    Weakness    SOCIAL HX:  Social History   Tobacco Use   Smoking status: Never   Smokeless tobacco: Never  Substance Use Topics   Alcohol use: Not Currently   FAMILY HX:  Family History  Family history unknown: Yes       Preferred Pharmacy: ALLERGIES:  Allergies  Allergen Reactions   Chlorhexidine Gluconate Itching   Vancomycin     Vancomycin infusion related reaction- May need Vanc to be given at a slower rate    Acetazolamide Er Rash     PERTINENT MEDICATIONS:  Outpatient Encounter Medications as of 09/24/2022  Medication Sig   apixaban (ELIQUIS) 5 MG TABS tablet Take 5 mg by mouth 2  (two) times daily.   atorvastatin (LIPITOR) 10 MG tablet Take 10 mg by mouth at bedtime.   calcium carbonate (TUMS - DOSED IN MG ELEMENTAL CALCIUM) 500 MG chewable tablet Chew 500 mg by mouth in the morning.   carvedilol (COREG) 3.125 MG tablet Take 1 tablet (3.125 mg total) by mouth 2 (two) times daily with a meal.   divalproex (DEPAKOTE) 500 MG DR tablet Take 500 mg by mouth 2 (two) times daily.   Emollient (EUCERIN SKIN CALMING) CREA Apply 1 application  topically at bedtime. To arms and dry skin   empagliflozin (JARDIANCE) 10 MG TABS tablet Take 10 mg by mouth in the morning.   ergocalciferol (VITAMIN D2) 1.25 MG (50000 UT) capsule Take 50,000 Units by mouth See admin instructions. 50,000 units once daily in the morning on Tuesday, Friday.   insulin lispro (HUMALOG) 100 UNIT/ML injection Inject 0-12 Units into the skin See admin instructions. Inject 0-12 units 3 times daily before meals and at bedtime per sliding scale: BS   70-200 : 0 units BS 201-250 : 2 units BS 251-300 : 4 units BS 301-350 : 6 units BS 351-400 : 8 units BS     > 400 : 12 units, recheck in 2 hours if BS > 350 or < 100, notify provider.   Ipratropium-Albuterol (COMBIVENT RESPIMAT) 20-100 MCG/ACT AERS respimat Inhale 1 puff into the lungs every 12 (twelve) hours.   Lactobacillus (ACIDOPHILUS) CAPS capsule Take 1 capsule by mouth 2 (two) times daily.   lidocaine (LMX) 4 % cream Apply 1 application  topically in the morning. Apply to gauze and affix over right posterior auricular area under cannula tubing for cushion/pain   PSYLLIUM HUSK PO Take 0.4 g by mouth 3 (three) times daily.   risperiDONE (RISPERDAL) 2 MG tablet Take 2 mg by mouth in the morning.   risperiDONE (RISPERDAL) 3 MG tablet Take 3 mg by mouth at bedtime.   spironolactone (ALDACTONE) 25 MG tablet Take 12.5 mg by mouth in the morning.   tamsulosin (FLOMAX) 0.4 MG CAPS capsule Take 1 capsule (0.4 mg total) by mouth every other day. (Patient taking differently:  Take 0.4 mg by mouth every other day. (Morning))   No facility-administered encounter medications on file as of 09/24/2022.     -------------------------------------------------------- Advance Care Planning/Goals of Care: Goals include to maximize quality of life and symptom management.  Identification of a healthcare agent-George Kuenzi (brother) [phone] Review of an advance directive document-MOST signed by pt-. CODE STATUS: MOST form signed 09/13/22: Full code, full intervention except for feeding tube for a defined trial period    Thank you for the opportunity to participate in the care of Becky Hendricks.  The palliative care team will continue to follow. Please call our  office at [phone] if we can be of additional assistance.   Marijo Conception, FNP-C  COVID-19 PATIENT SCREENING TOOL Asked and negative response unless otherwise noted:  Have you had symptoms of covid, tested positive or been in contact with someone with symptoms/positive test in the past 5-10 days? unknown

## 2022-12-25 ENCOUNTER — Encounter: Payer: Self-pay | Admitting: Family Medicine

## 2022-12-25 ENCOUNTER — Non-Acute Institutional Stay: Payer: Medicare Other | Admitting: Family Medicine

## 2022-12-25 VITALS — BP 110/56 | HR 73 | Temp 98.6°F | Resp 20

## 2022-12-25 DIAGNOSIS — E1165 Type 2 diabetes mellitus with hyperglycemia: Secondary | ICD-10-CM

## 2022-12-25 DIAGNOSIS — E440 Moderate protein-calorie malnutrition: Secondary | ICD-10-CM

## 2022-12-25 DIAGNOSIS — R532 Functional quadriplegia: Secondary | ICD-10-CM

## 2022-12-25 NOTE — Progress Notes (Signed)
Therapist, nutritional Palliative Care Consult Note Telephone: [phone]  Fax: [phone]   Date of encounter: 12/25/22 12:39 PM PATIENT NAME: Becky Hendricks [address] [place] [address]   [phone] (home)  DOB: [birth year] MRN: [id number] PRIMARY CARE PROVIDER:    Roderic Scarce, MD,  [address] [place] [address] [phone]  REFERRING PROVIDER:   Roderic Scarce, MD [address] [employer] [facility],  [place] [address] [phone]  Emergency Contact:    Contact Information     Name Relation Home Work Mobile   Elexius, Burrier Brother [phone]            I met face to face with patient in [facility]. Spoke with her brother [name] after visit by phone. Palliative Care was asked to follow this patient by consultation request of Roderic Scarce, MD to address advance care planning and complex medical decision making. This is a follow up visit.    Review of an existing advance directive document-MOST.  CODE STATUS: MOST as of 09/13/22: Full code with full intervention except for feeding tube for a defined trial period, signed by patient herself.   ASSESSMENT AND / RECOMMENDATIONS:  PPS: 40%  Type 2 DM with hyperglycemia with long term insulin use Likely recent hyperglycemia noted in last week relative to dietary indiscretions. Encourage pt to resume carb consistent diet. Has SSI only once per day with FSBS before dinner. Recommend HGB A1c if not done in the prior 3 months to assess overall control. Continue current meds.  2.    Functional quadriplegia Encourage pt to work with PT/OT to do as much of her personal care as she is capable of independently to keep current function.  3.    Moderate protein calorie malnutrition Weight is stable Intake is good. Continue supplements.   Follow up Palliative Care Visit:  Palliative Care continuing to follow up by monitoring for  changes in appetite, weight, functional and cognitive status for chronic disease progression and management in agreement with patient's stated goals of care. Next visit in 4 weeks or prn.  This visit was coded based on medical decision making (MDM).  Chief Complaint  Palliative Care is continuing to follow pt for chronic medical management in setting of chronic respiratory and heart failure complicated by Bipolar disorder.  HISTORY OF PRESENT ILLNESS: Becky Hendricks is a 77 y.o. year old female with chronic heart and respiratory failure complicated by Type 2 DM an Bipolar Disorder. She can stand with standing frame for transfers.  For the last week before dinner sugars have been running 178-339 with an isolated 151.  Orders are for blood sugars TID before meals and at bedtime. Denies pain, SOB above baseline, falls, nausea/vomiting, dysuria, or constipation.  Sleeping well.  Pt's brother [name] states he speaks with her daily and recent blood sugar spike may be relative to sweets she had around her recent birthday.  He states she also will have the staff take her to the vending machine and will purchase soda for them and herself which causes her blood sugar to spike and about half the time with activity she will have sweets as this is a weakness of hers.  He states his only concern is how to motivate her to participate with physical therapy more.   ACTIVITIES OF DAILY LIVING: CONTINENT OF BLADDER/BOWEL? No BATHING/DRESSING/FEEDING: Independent with feeding, bathing/dressing per staff  MOBILITY:   WHEELCHAIR  APPETITE?  Good WEIGHT:  205.4 lbs on 12/24/22  CURRENT PROBLEM LIST:  Patient Active Problem List   Diagnosis Date Noted   Moderate protein-calorie malnutrition (HCC) 09/24/2022   On mechanically assisted ventilation (HCC) 08/31/2022   Acute hypoxic respiratory failure (HCC) 08/30/2022   Altered mental status    Non compliance with medical treatment    Chronic respiratory failure with  hypoxia and hypercapnia (HCC) 04/21/2021   OSA treated with BiPAP    Hyponatremia 03/29/2021   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   Bipolar affective disorder, currently manic, mild (HCC) 11/19/2020   Constipation 11/18/2020   MRSA bacteremia    Mixed diabetic hyperlipidemia associated with type 2 diabetes mellitus (HCC) 11/17/2020   Type 2 diabetes mellitus with hyperglycemia, with long-term current use of insulin (HCC) 11/17/2020   Immunization due 07/26/2020   Psychosis (HCC)    Bipolar disorder (HCC) 04/27/2020   Fall 02/11/2020   Chronic diastolic CHF (congestive heart failure) (HCC) 01/22/2020   Acute osteomyelitis of pelvic region and thigh [facility])    Palliative care by specialist    Goals of care, counseling/discussion    Edema of upper extremity    Hyperphosphatemia    Sacral wound    Abscess of multiple sites of buttock 12/03/2019   AF (paroxysmal atrial fibrillation) (HCC) 12/03/2019   Pressure injury of skin 09/10/2019   Morbid obesity with BMI of 40.0-44.9, adult (HCC) 02/27/2018   PAST MEDICAL HISTORY:  Active Ambulatory Problems    Diagnosis Date Noted   Pressure injury of skin 09/10/2019   Abscess of multiple sites of buttock 12/03/2019   AF (paroxysmal atrial fibrillation) (HCC) 12/03/2019   Sacral wound    Hyperphosphatemia    Edema of upper extremity    Palliative care by specialist    Goals of care, counseling/discussion    Acute osteomyelitis of pelvic region and thigh (HCC)    Chronic diastolic CHF (congestive heart failure) (HCC) 01/22/2020   Fall 02/11/2020   Morbid obesity with BMI of 40.0-44.9, adult (HCC) 02/27/2018   Bipolar disorder (HCC) 04/27/2020   Psychosis (HCC)    Immunization due 07/26/2020   Mixed diabetic hyperlipidemia associated with type 2 diabetes mellitus (HCC) 11/17/2020   Type 2 diabetes mellitus with hyperglycemia, with long-term current use of insulin (HCC) 11/17/2020   Constipation 11/18/2020   MRSA bacteremia     Bipolar affective disorder, currently manic, mild (HCC) 11/19/2020   Hyponatremia 03/29/2021   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   Chronic respiratory failure with hypoxia and hypercapnia (HCC) 04/21/2021   OSA treated with BiPAP    Non compliance with medical treatment    Altered mental status    Acute hypoxic respiratory failure (HCC) 08/30/2022   On mechanically assisted ventilation (HCC) 08/31/2022   Moderate protein-calorie malnutrition (HCC) 09/24/2022   Resolved Ambulatory Problems    Diagnosis Date Noted   Acute respiratory distress syndrome (ARDS) due to COVID-19 virus (HCC) 09/07/2019   AKI (acute kidney injury) (HCC)    Respiratory failure with hypoxia (HCC) 09/15/2019   Pneumonia due to COVID-19 virus 09/15/2019   Metabolic encephalopathy 09/15/2019   Urinary tract infection without hematuria 09/15/2019   Severe sepsis (HCC) 12/03/2019   Osteomyelitis (HCC) 12/03/2019   Hypoxia    Cardiac arrest (HCC)    Encounter for central line placement    Acute respiratory failure (HCC)    Drug rash    Acute respiratory failure with hypoxemia (HCC) 01/19/2020   Acute pulmonary edema (HCC)    SIRS (systemic inflammatory response syndrome) (HCC) 11/17/2020  Nausea and vomiting 11/17/2020   Encephalopathy acute 11/18/2020   Acute respiratory failure with hypoxia (HCC) 03/29/2021   Pneumonia of lower lobe due to infectious organism 03/29/2021   Colitis 03/29/2021   Acute respiratory failure (HCC) 03/29/2021   Acute on chronic respiratory failure with hypoxia and hypercapnia (HCC)    Acute and chronic respiratory failure with hypercapnia (HCC) 04/10/2021   Past Medical History:  Diagnosis Date   Atrial fibrillation (HCC)    Clostridium difficile diarrhea 08/02/2019   Dehydration    Essential hypertension    Morbid obesity (HCC)    Type 2 diabetes mellitus (HCC)    Weakness    SOCIAL HX:  Social History   Tobacco Use   Smoking status: Never   Smokeless tobacco:  Never  Substance Use Topics   Alcohol use: Not Currently   FAMILY HX:  Family History  Family history unknown: Yes       Preferred Pharmacy: ALLERGIES:  Allergies  Allergen Reactions   Chlorhexidine Gluconate Itching   Vancomycin     Vancomycin infusion related reaction- May need Vanc to be given at a slower rate    Acetazolamide Er Rash     PERTINENT MEDICATIONS:  Outpatient Encounter Medications as of 12/25/2022  Medication Sig   apixaban (ELIQUIS) 5 MG TABS tablet Take 5 mg by mouth 2 (two) times daily.   atorvastatin (LIPITOR) 10 MG tablet Take 10 mg by mouth at bedtime.   calcium carbonate (TUMS - DOSED IN MG ELEMENTAL CALCIUM) 500 MG chewable tablet Chew 500 mg by mouth in the morning.   carbamide peroxide (DEBROX) 6.5 % OTIC solution Place 6 drops into both ears 2 (two) times daily.   divalproex (DEPAKOTE) 500 MG DR tablet Take 500 mg by mouth 2 (two) times daily.   Emollient (EUCERIN SKIN CALMING) CREA Apply 1 application  topically at bedtime. To arms and dry skin   empagliflozin (JARDIANCE) 10 MG TABS tablet Take 10 mg by mouth in the morning.   ergocalciferol (VITAMIN D2) 1.25 MG (50000 UT) capsule Take 50,000 Units by mouth See admin instructions. 50,000 units once daily in the morning on Tuesday, Friday.   ferrous gluconate (FERGON) 324 MG tablet Take 324 mg by mouth daily with breakfast.   insulin lispro (HUMALOG) 100 UNIT/ML injection Inject 0-12 Units into the skin See admin instructions. Inject 0-12 units daily at 5 pm per sliding scale: BS   70-200 : 0 units BS 201-250 : 2 units BS 251-300 : 4 units BS 301-350 : 6 units BS 351-400 : 8 units BS     > 400 : 12 units, recheck in 2 hours if BS > 350 or < 100, notify provider.   Ipratropium-Albuterol (COMBIVENT RESPIMAT) 20-100 MCG/ACT AERS respimat Inhale 1 puff into the lungs every 12 (twelve) hours.   metFORMIN (GLUCOPHAGE-XR) 500 MG 24 hr tablet Take 500 mg by mouth daily with breakfast.   nystatin powder Apply  1 Application topically 2 (two) times daily. Buttocks   PSYLLIUM HUSK PO Take 0.4 g by mouth 3 (three) times daily.   risperiDONE (RISPERDAL) 2 MG tablet Take 2 mg by mouth in the morning.   risperiDONE (RISPERDAL) 3 MG tablet Take 3 mg by mouth at bedtime.   spironolactone (ALDACTONE) 25 MG tablet Take 12.5 mg by mouth in the morning.   tamsulosin (FLOMAX) 0.4 MG CAPS capsule Take 1 capsule (0.4 mg total) by mouth every other day. (Patient taking differently: Take 0.4 mg by mouth every other  day. (Morning))   carvedilol (COREG) 3.125 MG tablet Take 1 tablet (3.125 mg total) by mouth 2 (two) times daily with a meal. (Patient taking differently: Take 3.125 mg by mouth 2 (two) times daily with a meal.)   Lactobacillus (ACIDOPHILUS) CAPS capsule Take 1 capsule by mouth 2 (two) times daily.   lidocaine (LMX) 4 % cream Apply 1 application  topically in the morning. Apply to gauze and affix over right posterior auricular area under cannula tubing for cushion/pain   No facility-administered encounter medications on file as of 12/25/2022.    History obtained from review of EMR, discussion with family, facility staff/caregiver and/or patient.     I reviewed available labs, medications, imaging, studies and related documents from the EMR.  There were no new records/imaging since last visit.   Physical Exam: GENERAL: NAD LUNGS: CTAB, no increased work of breathing, O2 @ 3L [place] CARDIAC:  S1S2, RRR with no MRG, trace non-pitting BLE edema bilaterally, No cyanosis ABD:  Normo-active BS x 4 quads, soft, non-tender EXTREMITIES: functional quadriplegia with some contracture of bilat hands, No muscle atrophy/subcutaneous fat loss NEURO:  Noted BLE weakness, no cognitive impairment PSYCH:  non-anxious, blunted affect, A & O x 3  Thank you for the opportunity to participate in the care of [facility]. Please call our main office at [phone] if we can be of additional assistance.    Joycelyn Man  FNP-C  [email or web address] .[facility]  Phone:  [phone]

## 2023-04-09 ENCOUNTER — Inpatient Hospital Stay (HOSPITAL_COMMUNITY): Payer: Medicare Other

## 2023-04-09 ENCOUNTER — Inpatient Hospital Stay (HOSPITAL_COMMUNITY)
Admission: EM | Admit: 2023-04-09 | Discharge: 2023-04-12 | DRG: 871 | Disposition: A | Discharge status: Skilled Nursing Facility | Payer: Medicare Other | Source: Skilled Nursing Facility | Attending: Internal Medicine | Admitting: Internal Medicine

## 2023-04-09 ENCOUNTER — Encounter (HOSPITAL_COMMUNITY): Payer: Self-pay

## 2023-04-09 ENCOUNTER — Emergency Department (HOSPITAL_COMMUNITY): Payer: Medicare Other

## 2023-04-09 DIAGNOSIS — A4189 Other specified sepsis: Principal | ICD-10-CM | POA: Diagnosis present

## 2023-04-09 DIAGNOSIS — Z794 Long term (current) use of insulin: Secondary | ICD-10-CM | POA: Diagnosis not present

## 2023-04-09 DIAGNOSIS — E782 Mixed hyperlipidemia: Secondary | ICD-10-CM | POA: Diagnosis present

## 2023-04-09 DIAGNOSIS — E119 Type 2 diabetes mellitus without complications: Secondary | ICD-10-CM

## 2023-04-09 DIAGNOSIS — G4733 Obstructive sleep apnea (adult) (pediatric): Secondary | ICD-10-CM | POA: Diagnosis present

## 2023-04-09 DIAGNOSIS — E669 Obesity, unspecified: Secondary | ICD-10-CM | POA: Insufficient documentation

## 2023-04-09 DIAGNOSIS — E1169 Type 2 diabetes mellitus with other specified complication: Secondary | ICD-10-CM | POA: Diagnosis present

## 2023-04-09 DIAGNOSIS — G9340 Encephalopathy, unspecified: Secondary | ICD-10-CM | POA: Diagnosis present

## 2023-04-09 DIAGNOSIS — Z6834 Body mass index (BMI) 34.0-34.9, adult: Secondary | ICD-10-CM | POA: Diagnosis not present

## 2023-04-09 DIAGNOSIS — E440 Moderate protein-calorie malnutrition: Secondary | ICD-10-CM | POA: Diagnosis present

## 2023-04-09 DIAGNOSIS — Z8674 Personal history of sudden cardiac arrest: Secondary | ICD-10-CM | POA: Diagnosis not present

## 2023-04-09 DIAGNOSIS — J9621 Acute and chronic respiratory failure with hypoxia: Secondary | ICD-10-CM | POA: Diagnosis present

## 2023-04-09 DIAGNOSIS — Z7901 Long term (current) use of anticoagulants: Secondary | ICD-10-CM

## 2023-04-09 DIAGNOSIS — Z8616 Personal history of COVID-19: Secondary | ICD-10-CM

## 2023-04-09 DIAGNOSIS — R6520 Severe sepsis without septic shock: Secondary | ICD-10-CM | POA: Diagnosis present

## 2023-04-09 DIAGNOSIS — Z7401 Bed confinement status: Secondary | ICD-10-CM

## 2023-04-09 DIAGNOSIS — Z7984 Long term (current) use of oral hypoglycemic drugs: Secondary | ICD-10-CM

## 2023-04-09 DIAGNOSIS — I480 Paroxysmal atrial fibrillation: Secondary | ICD-10-CM | POA: Diagnosis present

## 2023-04-09 DIAGNOSIS — F319 Bipolar disorder, unspecified: Secondary | ICD-10-CM | POA: Diagnosis present

## 2023-04-09 DIAGNOSIS — Z79899 Other long term (current) drug therapy: Secondary | ICD-10-CM

## 2023-04-09 DIAGNOSIS — J189 Pneumonia, unspecified organism: Secondary | ICD-10-CM

## 2023-04-09 DIAGNOSIS — R532 Functional quadriplegia: Secondary | ICD-10-CM | POA: Diagnosis present

## 2023-04-09 DIAGNOSIS — J9601 Acute respiratory failure with hypoxia: Secondary | ICD-10-CM | POA: Diagnosis not present

## 2023-04-09 DIAGNOSIS — J690 Pneumonitis due to inhalation of food and vomit: Secondary | ICD-10-CM | POA: Diagnosis present

## 2023-04-09 DIAGNOSIS — R569 Unspecified convulsions: Secondary | ICD-10-CM

## 2023-04-09 DIAGNOSIS — R0602 Shortness of breath: Secondary | ICD-10-CM | POA: Diagnosis present

## 2023-04-09 DIAGNOSIS — U071 COVID-19: Secondary | ICD-10-CM | POA: Diagnosis present

## 2023-04-09 DIAGNOSIS — I110 Hypertensive heart disease with heart failure: Secondary | ICD-10-CM | POA: Diagnosis present

## 2023-04-09 DIAGNOSIS — I5032 Chronic diastolic (congestive) heart failure: Secondary | ICD-10-CM | POA: Diagnosis present

## 2023-04-09 DIAGNOSIS — E1165 Type 2 diabetes mellitus with hyperglycemia: Secondary | ICD-10-CM | POA: Diagnosis present

## 2023-04-09 DIAGNOSIS — J1282 Pneumonia due to coronavirus disease 2019: Secondary | ICD-10-CM | POA: Diagnosis present

## 2023-04-09 DIAGNOSIS — J9622 Acute and chronic respiratory failure with hypercapnia: Secondary | ICD-10-CM | POA: Diagnosis present

## 2023-04-09 DIAGNOSIS — G40909 Epilepsy, unspecified, not intractable, without status epilepticus: Secondary | ICD-10-CM | POA: Diagnosis present

## 2023-04-09 LAB — URINALYSIS, W/ REFLEX TO CULTURE (INFECTION SUSPECTED)
Bacteria, UA: NONE SEEN
Bilirubin Urine: NEGATIVE
Glucose, UA: >=500 mg/dL — AB
Hgb urine dipstick: NEGATIVE
Ketones, ur: 20 mg/dL — AB
Leukocytes,Ua: NEGATIVE
Nitrite: NEGATIVE
Protein, ur: NEGATIVE mg/dL
Specific Gravity, Urine: 1.036 — ABNORMAL HIGH (ref 1.005–1.030)
pH: 5 (ref 5.0–8.0)

## 2023-04-09 LAB — COMPREHENSIVE METABOLIC PANEL
ALT: 12 U/L (ref 0–44)
AST: 10 U/L — ABNORMAL LOW (ref 15–41)
Albumin: 3.1 g/dL — ABNORMAL LOW (ref 3.5–5.0)
Alkaline Phosphatase: 31 U/L — ABNORMAL LOW (ref 38–126)
Anion gap: 13 (ref 5–15)
BUN: 16 mg/dL (ref 8–23)
CO2: 31 mmol/L (ref 22–32)
Calcium: 8.9 mg/dL (ref 8.9–10.3)
Chloride: 94 mmol/L — ABNORMAL LOW (ref 98–111)
Creatinine, Ser: 0.71 mg/dL (ref 0.44–1.00)
GFR, Estimated: >60 mL/min (ref >60)
Glucose, Bld: 171 mg/dL — ABNORMAL HIGH (ref 70–99)
Potassium: 4.2 mmol/L (ref 3.5–5.1)
Sodium: 138 mmol/L (ref 135–145)
Total Bilirubin: 0.7 mg/dL (ref 0.3–1.2)
Total Protein: 6.8 g/dL (ref 6.5–8.1)

## 2023-04-09 LAB — PROTIME-INR
INR: 1.5 — ABNORMAL HIGH (ref 0.8–1.2)
Prothrombin Time: 18.2 seconds — ABNORMAL HIGH (ref 11.4–15.2)

## 2023-04-09 LAB — BLOOD GAS, ARTERIAL
Acid-Base Excess: 6.5 mmol/L — ABNORMAL HIGH (ref 0.0–2.0)
Bicarbonate: 32.8 mmol/L — ABNORMAL HIGH (ref 20.0–28.0)
Drawn by: 331471
FIO2: 100 %
MECHVT: 47 mL
O2 Saturation: 100 %
PEEP: 5 cmH2O
Patient temperature: 37.8
RATE: 20 resp/min
pCO2 arterial: 55 mmHg — ABNORMAL HIGH (ref 32–48)
pH, Arterial: 7.39 (ref 7.35–7.45)
pO2, Arterial: 163 mmHg — ABNORMAL HIGH (ref 83–108)

## 2023-04-09 LAB — PROCALCITONIN: Procalcitonin: <0.1 ng/mL

## 2023-04-09 LAB — HEMOGLOBIN A1C
Hgb A1c MFr Bld: 7.1 % — ABNORMAL HIGH (ref 4.8–5.6)
Mean Plasma Glucose: 157.07 mg/dL

## 2023-04-09 LAB — CBC WITH DIFFERENTIAL/PLATELET
Abs Immature Granulocytes: 0.1 10*3/uL — ABNORMAL HIGH (ref 0.00–0.07)
Basophils Absolute: 0 10*3/uL (ref 0.0–0.1)
Basophils Relative: 0 %
Eosinophils Absolute: 0 10*3/uL (ref 0.0–0.5)
Eosinophils Relative: 0 %
HCT: 42.9 % (ref 36.0–46.0)
Hemoglobin: 13.3 g/dL (ref 12.0–15.0)
Immature Granulocytes: 1 %
Lymphocytes Relative: 24 %
Lymphs Abs: 2.6 10*3/uL (ref 0.7–4.0)
MCH: 30.9 pg (ref 26.0–34.0)
MCHC: 31 g/dL (ref 30.0–36.0)
MCV: 99.8 fL (ref 80.0–100.0)
Monocytes Absolute: 1.2 10*3/uL — ABNORMAL HIGH (ref 0.1–1.0)
Monocytes Relative: 12 %
Neutro Abs: 6.5 10*3/uL (ref 1.7–7.7)
Neutrophils Relative %: 63 %
Platelets: 144 10*3/uL — ABNORMAL LOW (ref 150–400)
RBC: 4.3 MIL/uL (ref 3.87–5.11)
RDW: 13.2 % (ref 11.5–15.5)
WBC: 10.5 10*3/uL (ref 4.0–10.5)
nRBC: 0 % (ref 0.0–0.2)

## 2023-04-09 LAB — BLOOD GAS, VENOUS
Acid-Base Excess: 7.7 mmol/L — ABNORMAL HIGH (ref 0.0–2.0)
Bicarbonate: 36.4 mmol/L — ABNORMAL HIGH (ref 20.0–28.0)
O2 Saturation: 82.5 %
Patient temperature: 37
pCO2, Ven: 69 mmHg — ABNORMAL HIGH (ref 44–60)
pH, Ven: 7.33 (ref 7.25–7.43)
pO2, Ven: 48 mmHg — ABNORMAL HIGH (ref 32–45)

## 2023-04-09 LAB — RESP PANEL BY RT-PCR (RSV, FLU A&B, COVID)  RVPGX2
Influenza A by PCR: NEGATIVE
Influenza B by PCR: NEGATIVE
Resp Syncytial Virus by PCR: NEGATIVE
SARS Coronavirus 2 by RT PCR: POSITIVE — AB

## 2023-04-09 LAB — AMMONIA: Ammonia: 18 umol/L (ref 9–35)

## 2023-04-09 LAB — GLUCOSE, CAPILLARY
Glucose-Capillary: 123 mg/dL — ABNORMAL HIGH (ref 70–99)
Glucose-Capillary: 137 mg/dL — ABNORMAL HIGH (ref 70–99)

## 2023-04-09 LAB — VALPROIC ACID LEVEL: Valproic Acid Lvl: 53 ug/mL (ref 50.0–100.0)

## 2023-04-09 LAB — I-STAT CG4 LACTIC ACID, ED: Lactic Acid, Venous: 1.2 mmol/L (ref 0.5–1.9)

## 2023-04-09 LAB — MRSA NEXT GEN BY PCR, NASAL: MRSA by PCR Next Gen: NOT DETECTED

## 2023-04-09 MED ORDER — ARFORMOTEROL TARTRATE 15 MCG/2ML IN NEBU
15.0000 ug | INHALATION_SOLUTION | Freq: Two times a day (BID) | RESPIRATORY_TRACT | Status: DC
  Administered 2023-04-09 – 2023-04-12 (×6): 15 ug via RESPIRATORY_TRACT
  Filled 2023-04-09 (×6): qty 2

## 2023-04-09 MED ORDER — ACETAMINOPHEN 650 MG RE SUPP
RECTAL | Status: AC
  Administered 2023-04-09: 650 mg via RECTAL
  Filled 2023-04-09: qty 1

## 2023-04-09 MED ORDER — ACETAMINOPHEN 160 MG/5ML PO SOLN: 650.0000 mg | Freq: Four times a day (QID) | ORAL | Status: DC | PRN

## 2023-04-09 MED ORDER — POLYETHYLENE GLYCOL 3350 17 G PO PACK
17.0000 g | PACK | Freq: Every day | ORAL | Status: DC
  Administered 2023-04-09 – 2023-04-10 (×2): 17 g
  Filled 2023-04-09 (×2): qty 1

## 2023-04-09 MED ORDER — ORAL CARE MOUTH RINSE
15.0000 mL | OROMUCOSAL | Status: DC
  Administered 2023-04-09 (×3): 15 mL via OROMUCOSAL

## 2023-04-09 MED ORDER — METHYLPREDNISOLONE SODIUM SUCC 40 MG IJ SOLR
40.0000 mg | Freq: Two times a day (BID) | INTRAMUSCULAR | Status: DC
  Administered 2023-04-09 – 2023-04-12 (×7): 40 mg via INTRAVENOUS
  Filled 2023-04-09 (×7): qty 1

## 2023-04-09 MED ORDER — VANCOMYCIN HCL 2000 MG/400ML IV SOLN
2000.0000 mg | Freq: Once | INTRAVENOUS | Status: AC
  Administered 2023-04-09: 2000 mg via INTRAVENOUS
  Filled 2023-04-09: qty 400

## 2023-04-09 MED ORDER — SODIUM CHLORIDE 0.9 % IV SOLN
500.0000 mg | INTRAVENOUS | Status: AC
  Administered 2023-04-09 – 2023-04-11 (×3): 500 mg via INTRAVENOUS
  Filled 2023-04-09 (×3): qty 5

## 2023-04-09 MED ORDER — DOCUSATE SODIUM 100 MG PO CAPS: 100.0000 mg | ORAL_CAPSULE | Freq: Two times a day (BID) | ORAL | Status: DC | PRN

## 2023-04-09 MED ORDER — PROPOFOL 1000 MG/100ML IV EMUL
0.0000 ug/kg/min | INTRAVENOUS | Status: DC
  Administered 2023-04-09: 15 ug/kg/min via INTRAVENOUS
  Filled 2023-04-09: qty 100

## 2023-04-09 MED ORDER — EPINEPHRINE 1 MG/10ML IJ SOSY
PREFILLED_SYRINGE | INTRAMUSCULAR | Status: AC
  Filled 2023-04-09: qty 10

## 2023-04-09 MED ORDER — ACETAMINOPHEN 325 MG PO TABS: 650.0000 mg | ORAL_TABLET | ORAL | Status: DC | PRN

## 2023-04-09 MED ORDER — FENTANYL CITRATE PF 50 MCG/ML IJ SOSY: 25.0000 ug | PREFILLED_SYRINGE | INTRAMUSCULAR | Status: DC | PRN

## 2023-04-09 MED ORDER — FENTANYL CITRATE PF 50 MCG/ML IJ SOSY: 50.0000 ug | PREFILLED_SYRINGE | INTRAMUSCULAR | Status: DC | PRN

## 2023-04-09 MED ORDER — IBUPROFEN 200 MG PO TABS
400.0000 mg | ORAL_TABLET | Freq: Four times a day (QID) | ORAL | Status: DC | PRN
  Administered 2023-04-09: 400 mg via ORAL
  Filled 2023-04-09: qty 2

## 2023-04-09 MED ORDER — ROCURONIUM BROMIDE 50 MG/5ML IV SOLN
100.0000 mg | Freq: Once | INTRAVENOUS | Status: AC
  Filled 2023-04-09: qty 10

## 2023-04-09 MED ORDER — GERHARDT'S BUTT CREAM
TOPICAL_CREAM | CUTANEOUS | Status: DC | PRN
  Filled 2023-04-09: qty 1

## 2023-04-09 MED ORDER — FENTANYL 2500MCG IN NS 250ML (10MCG/ML) PREMIX INFUSION
50.0000 ug/h | INTRAVENOUS | Status: DC
  Administered 2023-04-09: 50 ug/h via INTRAVENOUS

## 2023-04-09 MED ORDER — LACTATED RINGERS IV SOLN: INTRAVENOUS | Status: DC

## 2023-04-09 MED ORDER — POLYETHYLENE GLYCOL 3350 17 G PO PACK: 17.0000 g | PACK | Freq: Every day | ORAL | Status: DC | PRN

## 2023-04-09 MED ORDER — ROCURONIUM BROMIDE 10 MG/ML (PF) SYRINGE
PREFILLED_SYRINGE | INTRAVENOUS | Status: AC
  Administered 2023-04-09: 100 mg via INTRAVENOUS
  Filled 2023-04-09: qty 10

## 2023-04-09 MED ORDER — REVEFENACIN 175 MCG/3ML IN SOLN
175.0000 ug | Freq: Every day | RESPIRATORY_TRACT | Status: DC
  Administered 2023-04-09 – 2023-04-12 (×4): 175 ug via RESPIRATORY_TRACT
  Filled 2023-04-09 (×4): qty 3

## 2023-04-09 MED ORDER — ORAL CARE MOUTH RINSE: 15.0000 mL | OROMUCOSAL | Status: DC | PRN

## 2023-04-09 MED ORDER — ACETAMINOPHEN 650 MG RE SUPP: 650.0000 mg | RECTAL | Status: DC | PRN

## 2023-04-09 MED ORDER — PROPOFOL 1000 MG/100ML IV EMUL
INTRAVENOUS | Status: AC
  Administered 2023-04-09: 5 ug/kg/min via INTRAVENOUS
  Filled 2023-04-09: qty 100

## 2023-04-09 MED ORDER — FAMOTIDINE 20 MG PO TABS
20.0000 mg | ORAL_TABLET | Freq: Two times a day (BID) | ORAL | Status: DC
  Administered 2023-04-09 – 2023-04-10 (×2): 20 mg
  Filled 2023-04-09 (×3): qty 1

## 2023-04-09 MED ORDER — LACTATED RINGERS IV BOLUS
1000.0000 mL | Freq: Once | INTRAVENOUS | Status: AC
  Administered 2023-04-09: 1000 mL via INTRAVENOUS

## 2023-04-09 MED ORDER — KETAMINE HCL 50 MG/5ML IJ SOSY: 1.0000 mg/kg | PREFILLED_SYRINGE | Freq: Once | INTRAMUSCULAR | Status: AC

## 2023-04-09 MED ORDER — DOCUSATE SODIUM 50 MG/5ML PO LIQD
100.0000 mg | Freq: Two times a day (BID) | ORAL | Status: DC
  Administered 2023-04-09 – 2023-04-10 (×2): 100 mg
  Filled 2023-04-09 (×2): qty 10

## 2023-04-09 MED ORDER — PIPERACILLIN-TAZOBACTAM 3.375 G IVPB 30 MIN
3.3750 g | Freq: Once | INTRAVENOUS | Status: AC
  Administered 2023-04-09: 3.375 g via INTRAVENOUS
  Filled 2023-04-09: qty 50

## 2023-04-09 MED ORDER — FENTANYL 2500MCG IN NS 250ML (10MCG/ML) PREMIX INFUSION
20.0000 ug/h | INTRAVENOUS | Status: DC
  Administered 2023-04-09: 20 ug/h via INTRAVENOUS
  Filled 2023-04-09: qty 250

## 2023-04-09 MED ORDER — ORAL CARE MOUTH RINSE
15.0000 mL | OROMUCOSAL | Status: DC
  Administered 2023-04-10 (×6): 15 mL via OROMUCOSAL

## 2023-04-09 MED ORDER — INSULIN ASPART 100 UNIT/ML IJ SOLN
0.0000 [IU] | INTRAMUSCULAR | Status: DC
  Administered 2023-04-09 – 2023-04-10 (×5): 2 [IU] via SUBCUTANEOUS
  Administered 2023-04-10: 3 [IU] via SUBCUTANEOUS

## 2023-04-09 MED ORDER — PANTOPRAZOLE SODIUM 40 MG IV SOLR
40.0000 mg | Freq: Every day | INTRAVENOUS | Status: DC
  Administered 2023-04-09 – 2023-04-11 (×3): 40 mg via INTRAVENOUS
  Filled 2023-04-09 (×3): qty 10

## 2023-04-09 MED ORDER — ONDANSETRON HCL 4 MG/2ML IJ SOLN: 4.0000 mg | Freq: Four times a day (QID) | INTRAMUSCULAR | Status: DC | PRN

## 2023-04-09 MED ORDER — KETAMINE HCL 50 MG/5ML IJ SOSY
PREFILLED_SYRINGE | INTRAMUSCULAR | Status: AC
  Administered 2023-04-09: 102 mg via INTRAVENOUS
  Filled 2023-04-09: qty 10

## 2023-04-09 MED ORDER — FENTANYL CITRATE PF 50 MCG/ML IJ SOSY: 50.0000 ug | PREFILLED_SYRINGE | Freq: Once | INTRAMUSCULAR | Status: DC

## 2023-04-09 MED ORDER — FENTANYL CITRATE PF 50 MCG/ML IJ SOSY
25.0000 ug | PREFILLED_SYRINGE | INTRAMUSCULAR | Status: DC | PRN
  Administered 2023-04-09: 50 ug via INTRAVENOUS

## 2023-04-09 MED ORDER — PIPERACILLIN-TAZOBACTAM 3.375 G IVPB
3.3750 g | Freq: Three times a day (TID) | INTRAVENOUS | Status: DC
  Administered 2023-04-09 – 2023-04-12 (×9): 3.375 g via INTRAVENOUS
  Filled 2023-04-09 (×9): qty 50

## 2023-04-09 MED ORDER — ACETAMINOPHEN 325 MG PO TABS: 650.0000 mg | ORAL_TABLET | Freq: Four times a day (QID) | ORAL | Status: DC | PRN

## 2023-04-09 MED ORDER — FENTANYL BOLUS VIA INFUSION
50.0000 ug | INTRAVENOUS | Status: DC | PRN
  Administered 2023-04-10: 50 ug via INTRAVENOUS

## 2023-04-09 NOTE — ED Notes (Signed)
Per facility at pts baseline, she is able to feed herself and oriented. Non ambulatory.

## 2023-04-09 NOTE — Procedures (Signed)
Patient Name: Becky Hendricks  MRN: [id number]  Epilepsy Attending: Charlsie Quest  Referring Physician/Provider: Dr Levon Hedger Duration: 8/27/024 1731 to 04/10/2023 0108  Patient history: 77yo F with ams and twitching of arms. Rapid EEG to evaluate for seizure  Level of alertness: comatose  AEDs during EEG study: Propofol  Technical aspects: This EEG was obtained using a 10 lead EEG system positioned circumferentially without any parasagittal coverage (rapid EEG). Computer selected EEG is reviewed as  well as background features and all clinically significant events.  Description: EEG showed continuous generalized low amplitude 3-6Hz  theta-delta slowing. Hyperventilation and photic stimulation were not performed.     Of note, study was technically difficult due to significant myogenic artifact.  ABNORMALITY - Continuous slow, generalized  IMPRESSION: This limited ceribell EEG is suggestive of severe diffuse encephalopathy, nonspecific etiology. No seizures or epileptiform discharges were seen throughout the recording.  If suspicion for interictal activity remains a concern, a conventional EEG can be considered.   Torez Beauregard Annabelle Harman

## 2023-04-09 NOTE — ED Provider Notes (Signed)
[facility] AT [facility] Provider Note   CSN: [id number] Arrival date & time: 04/09/23  1323     History  Chief Complaint  Patient presents with   Altered Mental Status    Becky Hendricks is a 77 y.o. female.  This is a 77 year old female coming from skilled nursing facility due to altered mental status.  I reviewed the patient's paperwork from her skilled nursing facility.  Patient is a full code.  Patient has a history of seizure disorder, diabetes, prior history of pneumonia.  History obtained via EMS.  Review the patient's most recent hospital discharge note from January of this year.   Altered Mental Status      Home Medications Prior to Admission medications   Medication Sig Start Date End Date Taking? Authorizing Provider  apixaban (ELIQUIS) 5 MG TABS tablet Take 5 mg by mouth 2 (two) times daily.    [provider]  atorvastatin (LIPITOR) 10 MG tablet Take 10 mg by mouth at bedtime.    [provider]  calcium carbonate (TUMS - DOSED IN MG ELEMENTAL CALCIUM) 500 MG chewable tablet Chew 500 mg by mouth in the morning.    [provider]  carvedilol (COREG) 3.125 MG tablet Take 1 tablet (3.125 mg total) by mouth 2 (two) times daily with a meal. Patient taking differently: Take 3.125 mg by mouth 2 (two) times daily with a meal. 09/08/22 10/08/22  Hughie Closs, MD  divalproex (DEPAKOTE) 500 MG DR tablet Take 500 mg by mouth 2 (two) times daily.    [provider]  Emollient (EUCERIN SKIN CALMING) CREA Apply 1 application  topically at bedtime. To arms and dry skin    [provider]  empagliflozin (JARDIANCE) 10 MG TABS tablet Take 10 mg by mouth in the morning.    [provider]  ergocalciferol (VITAMIN D2) 1.25 MG (50000 UT) capsule Take 50,000 Units by mouth See admin instructions. 50,000 units once daily in the morning on Tuesday, Friday.    [provider]  ferrous  gluconate (FERGON) 324 MG tablet Take 324 mg by mouth daily with breakfast.    [provider]  insulin lispro (HUMALOG) 100 UNIT/ML injection Inject 0-12 Units into the skin See admin instructions. Inject 0-12 units daily at 5 pm per sliding scale: BS   70-200 : 0 units BS 201-250 : 2 units BS 251-300 : 4 units BS 301-350 : 6 units BS 351-400 : 8 units BS     > 400 : 12 units, recheck in 2 hours if BS > 350 or < 100, notify provider.    [provider]  Ipratropium-Albuterol (COMBIVENT RESPIMAT) 20-100 MCG/ACT AERS respimat Inhale 1 puff into the lungs every 12 (twelve) hours.    [provider]  Lactobacillus (ACIDOPHILUS) CAPS capsule Take 1 capsule by mouth 2 (two) times daily.    [provider]  lidocaine (LMX) 4 % cream Apply 1 application  topically in the morning. Apply to gauze and affix over right posterior auricular area under cannula tubing for cushion/pain    [provider]  metFORMIN (GLUCOPHAGE-XR) 500 MG 24 hr tablet Take 500 mg by mouth daily with breakfast.    [provider]  nystatin powder Apply 1 Application topically 2 (two) times daily. Buttocks    [provider]  PSYLLIUM HUSK PO Take 0.4 g by mouth 3 (three) times daily.    [provider]  risperiDONE (RISPERDAL) 2 MG tablet Take  2 mg by mouth in the morning.    [provider]  risperiDONE (RISPERDAL) 3 MG tablet Take 3 mg by mouth at bedtime. 11/09/21   [provider]  spironolactone (ALDACTONE) 25 MG tablet Take 12.5 mg by mouth in the morning. 11/27/21   [provider]  tamsulosin (FLOMAX) 0.4 MG CAPS capsule Take 1 capsule (0.4 mg total) by mouth every other day. Patient taking differently: Take 0.4 mg by mouth every other day. (Morning) 04/20/21   Pennie Banter, DO      Allergies    Chlorhexidine gluconate, Vancomycin, and Acetazolamide er    Review of Systems   Review of Systems  Physical Exam Updated  Vital Signs BP (!) 145/61 (BP Location: Left Arm)   Pulse 96   Temp (!) 102 F (38.9 C) (Rectal)   Resp (!) 35   LMP  (LMP Unknown)   SpO2 91%  Physical Exam Vitals reviewed.  Constitutional:      General: She is in acute distress.     Appearance: She is ill-appearing and toxic-appearing.  HENT:     Head: Atraumatic.  Eyes:     Pupils: Pupils are equal, round, and reactive to light.  Cardiovascular:     Rate and Rhythm: Tachycardia present.  Pulmonary:     Effort: Respiratory distress present.     Breath sounds: No stridor. Rhonchi present.  Abdominal:     General: There is no distension.     Palpations: Abdomen is soft.  Musculoskeletal:     Cervical back: No rigidity.  Skin:    General: Skin is warm and dry.     Findings: No lesion.     ED Results / Procedures / Treatments   Labs (all labs ordered are listed, but only abnormal results are displayed) Labs Reviewed  CULTURE, BLOOD (ROUTINE X 2)  CULTURE, BLOOD (ROUTINE X 2)  RESP PANEL BY RT-PCR (RSV, FLU A&B, COVID)  RVPGX2  COMPREHENSIVE METABOLIC PANEL  CBC WITH DIFFERENTIAL/PLATELET  PROTIME-INR  URINALYSIS, W/ REFLEX TO CULTURE (INFECTION SUSPECTED)  BLOOD GAS, VENOUS  I-STAT CG4 LACTIC ACID, ED  I-STAT CG4 LACTIC ACID, ED    EKG None  Radiology No results found.  Procedures Procedure Name: Intubation Date/Time: 04/09/2023 2:54 PM  Performed by: Arletha Pili, DOPre-anesthesia Checklist: Patient identified, Emergency Drugs available, Suction available, Patient being monitored and Timeout performed Oxygen Delivery Method: Non-rebreather mask Preoxygenation: Pre-oxygenation with 100% oxygen Induction Type: Rapid sequence Ventilation: Mask ventilation without difficulty Laryngoscope Size: Glidescope and 3 Grade View: Grade I Tube size: 7.5 mm Number of attempts: 1 Airway Equipment and Method: Video-laryngoscopy Placement Confirmation: ETT inserted through vocal cords under direct vision,  Positive ETCO2 and Breath sounds checked- equal and bilateral Secured at: 25 cm Tube secured with: ETT holder Comments: Uncomplicated intubation    .Critical Care  Performed by: Arletha Pili, DO Authorized by: Arletha Pili, DO   Critical care provider statement:    Critical care time (minutes):  80   Critical care time was exclusive of:  Separately billable procedures and treating other patients   Critical care was necessary to treat or prevent imminent or life-threatening deterioration of the following conditions:  Respiratory failure and sepsis   Critical care was time spent personally by me on the following activities:  Blood draw for specimens, discussions with consultants, evaluation of patient's response to treatment, examination of patient, interpretation of cardiac output measurements, ordering and performing treatments and interventions, ordering and review of  laboratory studies, ordering and review of radiographic studies, pulse oximetry, re-evaluation of patient's condition, review of old charts and ventilator management     Medications Ordered in ED Medications  vancomycin (VANCOREADY) IVPB 2000 mg/400 mL (has no administration in time range)  piperacillin-tazobactam (ZOSYN) IVPB 3.375 g (has no administration in time range)    ED Course/ Medical Decision Making/ A&P                                 Medical Decision Making This is a 77 year old female presenting to the emergency department with fever and altered mental status.  Differential diagnoses include sepsis, pneumonia.  Plan-on auscultation and bedside ultrasound I believe the patient likely has a right sided pneumonia.  My independent review the patient's chest x-ray does show a pneumonia in the right lobe.  My dependent review the patient's EKG shows sinus tachycardia without evidence of ischemia.  Started the patient on broad-spectrum antibiotics.   I have placed patient on BiPAP.  She is able to  follow commands appropriately for me enough at this time, however I will continue to reassess the patient's respiratory status as I do have concerns the patient may require intubation.  I will provide the patient with 2 L of IV fluid, not giving 30 cc/kg out of concern for fluid overload.  I believe the patient has severe sepsis.  Reassessment-unfortunately, patient's mental status declined.  She was no longer able to respond to commands, she was unsafe to leave on BiPAP.  Intubated the patient for airway protection.  She is currently on a propofol and fentanyl drip.  Will admit patient to ICU.  Reassessment-spoke with intensivist at [facility], they are reaching out to intensivist team at [facility] for admission.   Amount and/or Complexity of Data Reviewed Labs: ordered. Radiology: ordered.  Risk Prescription drug management. Decision regarding hospitalization.           Final Clinical Impression(s) / ED Diagnoses Final diagnoses:  None    Rx / DC Orders ED Discharge Orders     None         Arletha Pili, DO 04/13/23 1121

## 2023-04-09 NOTE — Progress Notes (Signed)
Pt intubated in ED due to not maintaining on BIPAP.

## 2023-04-09 NOTE — Progress Notes (Signed)
eLink Physician-Brief Progress Note Patient Name: Becky Hendricks DOB: [birth year] MRN: [id number]   Date of Service  04/09/2023  HPI/Events of Note  77 year old initially presented with unresponsiveness and increased work of breathing that eventually developed respiratory failure requiring mechanical ventilation in the setting of COVID positivity.  Requesting verification of the OG-tube.  eICU Interventions  Terminates in the stomach, okay to use, orders updated     Intervention Category Minor Interventions: Routine modifications to care plan (e.g. PRN medications for pain, fever)  Vennessa Affinito 04/09/2023, 10:58 PM

## 2023-04-09 NOTE — Progress Notes (Signed)
PHARMACY NOTE -  Zosyn  Pharmacy has been assisting with dosing of Zosyn for aspiration pneumonia. Dosage remains stable at 3.375 g IV q8 hr and further renal adjustments per institutional Pharmacy antibiotic protocol  Pharmacy will sign off, following peripherally for culture results, dose adjustments, and length of therapy. Please reconsult if a change in clinical status warrants re-evaluation of dosage.  Bernadene Person, PharmD, BCPS [phone] 04/09/2023, 5:32 PM

## 2023-04-09 NOTE — ED Notes (Signed)
ED TO INPATIENT HANDOFF REPORT  ED Nurse Name and Phone #: Suann Larry Name/Age/Gender Becky Hendricks 77 y.o. female Room/Bed: RESB/RESB  Code Status   Code Status: Full Code  Home/SNF/Other Skilled nursing facility Patient is not oriented to: self, place, time, and situation Is this baseline? No   Triage Complete: Triage complete  Chief Complaint Acute on chronic respiratory failure with hypoxia and hypercapnia (HCC) [O53.66, J96.22]  Triage Note Pt arrived via EMS, from [facility], found approx 1 hr ago unresponsive. Pale, diaphoretic on EMS arrival, able to speak 3-4 words at a time.     Spo2 85% on [place] 160/90 20 rr 146 CBG Endtidal 50  100.  18 G  L AC 650 tylenol given LR 750    Allergies Allergies  Allergen Reactions   Chlorhexidine Gluconate Itching   Vancomycin     Vancomycin infusion related reaction- May need Vanc to be given at a slower rate    Acetazolamide Er Rash    Level of Care/Admitting Diagnosis ED Disposition     ED Disposition  Admit   Condition  --   Comment  Hospital Area: [facility] [[id number]]  Level of Care: ICU [6]  May admit patient to [facility] or [facility] if equivalent level of care is available:: Yes  Covid Evaluation: Asymptomatic - no recent exposure (last 10 days) testing not required  Diagnosis: Acute on chronic respiratory failure with hypoxia and hypercapnia [facility]) [[id number]]  Admitting Physician: Lorin Glass [[id number]]  Attending Physician: Lorin Glass [[id number]]  Certification:: I certify this patient will need inpatient services for at least 2 midnights  Expected Medical Readiness: 04/18/2023          B Medical/Surgery History Past Medical History:  Diagnosis Date   Acute and chronic respiratory failure with hypercapnia (HCC) 04/10/2021   Acute on chronic respiratory failure with hypoxia and hypercapnia (HCC)    Acute respiratory failure (HCC) 03/29/2021   Atrial  fibrillation (HCC)    Bipolar disorder (HCC)    Bipolar disorder (HCC) 04/27/2020   Cardiac arrest (HCC)    Clostridium difficile diarrhea 08/02/2019   Colitis 03/29/2021   Dehydration    Drug rash    Encephalopathy acute 11/18/2020   Essential hypertension    Fall 02/11/2020   Hypoxia    Morbid obesity (HCC)    Nausea and vomiting 11/17/2020   Osteomyelitis (HCC) 12/03/2019   Pneumonia due to COVID-19 virus 09/15/2019   Pneumonia of lower lobe due to infectious organism 03/29/2021   Severe sepsis (HCC) 12/03/2019   SIRS (systemic inflammatory response syndrome) (HCC) 11/17/2020   Type 2 diabetes mellitus (HCC)    Urinary tract infection without hematuria 09/15/2019   Weakness    Past Surgical History:  Procedure Laterality Date   INCISION AND DRAINAGE PERIRECTAL ABSCESS Left 12/04/2019   Procedure: IRRIGATION AND DEBRIDEMENT LEFT BUTTOCK ABSCESS;  Surgeon: Violeta Gelinas, MD;  Location: [facility] OR;  Service: General;  Laterality: Left;   INCISION AND DRAINAGE PERIRECTAL ABSCESS Left 12/10/2019   Procedure: REPEAT IRRIGATION AND DEBRIDEMENT BUTTOCK  ABSCESS;  Surgeon: Abigail Miyamoto, MD;  Location: [facility] OR;  Service: General;  Laterality: Left;   IR FLUORO GUIDE CV LINE RIGHT  11/25/2020   IR RADIOLOGIST EVAL & MGMT  12/29/2020   IR US GUIDE VASC ACCESS RIGHT  11/25/2020     A IV Location/Drains/Wounds Patient Lines/Drains/Airways Status     Active Line/Drains/Airways     Name Placement date Placement  time Site Days   Peripheral IV 04/09/23 20 G Left;Posterior Hand 04/09/23  1359  Hand  less than 1   Peripheral IV 04/09/23 18 G 1" Left Antecubital 04/09/23  1404  Antecubital  less than 1   Peripheral IV 04/09/23 20 G Right Antecubital 04/09/23  1404  Antecubital  less than 1   External Urinary Catheter 09/04/22  1820  --  217   Airway 7.5 mm 04/09/23  1455  -- less than 1   Pressure Injury 04/21/21 Sacrum Medial Stage 2 -  Partial thickness loss of dermis presenting as a shallow  open injury with a red, pink wound bed without slough. yellow, pink, craterous  04/25/21; updated staging, healed, samll opening 04/21/21  1720  -- 718   Pressure Injury 04/24/21 Lumbar Right Deep Tissue Pressure Injury - Purple or maroon localized area of discolored intact skin or blood-filled blister due to damage of underlying soft tissue from pressure and/or shear. 04/24/21  2000  -- 715   Pressure Injury Nose  Stage 2 -  Partial thickness loss of dermis presenting as a shallow open injury with a red, pink wound bed without slough. scab --  --  -- --   Pressure Injury 09/06/22 Thigh Posterior;Right 09/06/22  --  -- 215   Wound / Incision (Open or Dehisced) (IAD) Incontinence Associated Dermatitis Labia Right;Left Red, painful --  --  Labia  --            Intake/Output Last 24 hours  Intake/Output Summary (Last 24 hours) at 04/09/2023 1554 Last data filed at 04/09/2023 1429 Gross per 24 hour  Intake 50 ml  Output --  Net 50 ml    Labs/Imaging Results for orders placed or performed during the hospital encounter of 04/09/23 (from the past 48 hour(s))  Blood gas, venous (at [facility])     Status: Abnormal   Collection Time: 04/09/23  1:35 PM  Result Value Ref Range   pH, Ven 7.33 7.25 - 7.43   pCO2, Ven 69 (H) 44 - 60 mmHg   pO2, Ven 48 (H) 32 - 45 mmHg   Bicarbonate 36.4 (H) 20.0 - 28.0 mmol/L   Acid-Base Excess 7.7 (H) 0.0 - 2.0 mmol/L   O2 Saturation 82.5 %   Patient temperature 37.0     Comment: Performed at [facility], [address], [place], [place] [address]  Comprehensive metabolic panel     Status: Abnormal   Collection Time: 04/09/23  1:36 PM  Result Value Ref Range   Sodium 138 135 - 145 mmol/L   Potassium 4.2 3.5 - 5.1 mmol/L   Chloride 94 (L) 98 - 111 mmol/L   CO2 31 22 - 32 mmol/L   Glucose, Bld 171 (H) 70 - 99 mg/dL    Comment: Glucose reference range applies only to samples taken after fasting for at least 8 hours.   BUN 16 8 - 23 mg/dL    Creatinine, Ser 6.04 0.44 - 1.00 mg/dL   Calcium 8.9 8.9 - 54.0 mg/dL   Total Protein 6.8 6.5 - 8.1 g/dL   Albumin 3.1 (L) 3.5 - 5.0 g/dL   AST 10 (L) 15 - 41 U/L   ALT 12 0 - 44 U/L   Alkaline Phosphatase 31 (L) 38 - 126 U/L   Total Bilirubin 0.7 0.3 - 1.2 mg/dL   GFR, Estimated >98 >11 mL/min    Comment: (NOTE) Calculated using the CKD-EPI Creatinine Equation (2021)    Anion  gap 13 5 - 15    Comment: Performed at [facility], [address], [place], [place] [address]  CBC with Differential     Status: Abnormal   Collection Time: 04/09/23  1:36 PM  Result Value Ref Range   WBC 10.5 4.0 - 10.5 K/uL   RBC 4.30 3.87 - 5.11 MIL/uL   Hemoglobin 13.3 12.0 - 15.0 g/dL   HCT 62.7 03.5 - 00.9 %   MCV 99.8 80.0 - 100.0 fL   MCH 30.9 26.0 - 34.0 pg   MCHC 31.0 30.0 - 36.0 g/dL   RDW 38.1 82.9 - 93.7 %   Platelets 144 (L) 150 - 400 K/uL   nRBC 0.0 0.0 - 0.2 %   Neutrophils Relative % 63 %   Neutro Abs 6.5 1.7 - 7.7 K/uL   Lymphocytes Relative 24 %   Lymphs Abs 2.6 0.7 - 4.0 K/uL   Monocytes Relative 12 %   Monocytes Absolute 1.2 (H) 0.1 - 1.0 K/uL   Eosinophils Relative 0 %   Eosinophils Absolute 0.0 0.0 - 0.5 K/uL   Basophils Relative 0 %   Basophils Absolute 0.0 0.0 - 0.1 K/uL   Immature Granulocytes 1 %   Abs Immature Granulocytes 0.10 (H) 0.00 - 0.07 K/uL    Comment: Performed at [facility], [address], [place], [place] [address]  Protime-INR     Status: Abnormal   Collection Time: 04/09/23  1:36 PM  Result Value Ref Range   Prothrombin Time 18.2 (H) 11.4 - 15.2 seconds   INR 1.5 (H) 0.8 - 1.2    Comment: (NOTE) INR goal varies based on device and disease states. Performed at [facility], [address], [place], [place] [address]   I-Stat CG4 Lactic Acid     Status: None   Collection Time: 04/09/23  1:41 PM  Result Value Ref Range   Lactic Acid, Venous 1.2 0.5 - 1.9 mmol/L  Resp panel by RT-PCR (RSV,  Flu A&B, Covid) Anterior Nasal Swab     Status: Abnormal   Collection Time: 04/09/23  2:19 PM   Specimen: Anterior Nasal Swab  Result Value Ref Range   SARS Coronavirus 2 by RT PCR POSITIVE (A) NEGATIVE    Comment: (NOTE) SARS-CoV-2 target nucleic acids are DETECTED.  The SARS-CoV-2 RNA is generally detectable in upper respiratory specimens during the acute phase of infection. Positive results are indicative of the presence of the identified virus, but do not rule out bacterial infection or co-infection with other pathogens not detected by the test. Clinical correlation with patient history and other diagnostic information is necessary to determine patient infection status. The expected result is Negative.  Fact Sheet for Patients: BloggerCourse.com  Fact Sheet for Healthcare Providers: SeriousBroker.it  This test is not yet approved or cleared by the [place] FDA and  has been authorized for detection and/or diagnosis of SARS-CoV-2 by FDA under an Emergency Use Authorization (EUA).  This EUA will remain in effect (meaning this test can be used) for the duration of  the COVID-19 declaration under Section 564(b)(1) of the A ct, 21 U.S.C. section 360bbb-3(b)(1), unless the authorization is terminated or revoked sooner.     Influenza A by PCR NEGATIVE NEGATIVE   Influenza B by PCR NEGATIVE NEGATIVE    Comment: (NOTE) The Xpert Xpress SARS-CoV-2/FLU/RSV plus assay is intended as an aid in the diagnosis of influenza from Nasopharyngeal swab specimens and should not be used as a  sole basis for treatment. Nasal washings and aspirates are unacceptable for Xpert Xpress SARS-CoV-2/FLU/RSV testing.  Fact Sheet for Patients: BloggerCourse.com  Fact Sheet for Healthcare Providers: SeriousBroker.it  This test is not yet approved or cleared by the [place] FDA and has been  authorized for detection and/or diagnosis of SARS-CoV-2 by FDA under an Emergency Use Authorization (EUA). This EUA will remain in effect (meaning this test can be used) for the duration of the COVID-19 declaration under Section 564(b)(1) of the Act, 21 U.S.C. section 360bbb-3(b)(1), unless the authorization is terminated or revoked.     Resp Syncytial Virus by PCR NEGATIVE NEGATIVE    Comment: (NOTE) Fact Sheet for Patients: BloggerCourse.com  Fact Sheet for Healthcare Providers: SeriousBroker.it  This test is not yet approved or cleared by the [place] FDA and has been authorized for detection and/or diagnosis of SARS-CoV-2 by FDA under an Emergency Use Authorization (EUA). This EUA will remain in effect (meaning this test can be used) for the duration of the COVID-19 declaration under Section 564(b)(1) of the Act, 21 U.S.C. section 360bbb-3(b)(1), unless the authorization is terminated or revoked.  Performed at [facility], [address], [place], [place] [address]    DG Chest Portable 1 View  Result Date: 04/09/2023 CLINICAL DATA:  Sepsis EXAM: PORTABLE CHEST 1 VIEW COMPARISON:  09/01/2022 FINDINGS: Mild right lower lobe opacity, favoring atelectasis, pneumonia not excluded. Left lung is essentially clear. No pleural effusion or pneumothorax. The heart is normal in size. IMPRESSION: Mild right lower lobe opacity, favoring atelectasis, pneumonia not excluded. Electronically Signed   By: Charline Bills M.D.   On: 04/09/2023 15:17    Pending Labs Unresulted Labs (From admission, onward)     Start     Ordered   04/09/23 1553  Valproic acid level  ONCE - STAT,   STAT        04/09/23 1552   04/09/23 1552  Ammonia  Once,   R        04/09/23 1551   04/09/23 1551  Urinalysis, Routine w reflex microscopic -Urine, Catheterized  Once,   R       Question:  Specimen Source  Answer:  Urine, Catheterized    04/09/23 1550   04/09/23 1551  Rapid urine drug screen (hospital performed)  ONCE - STAT,   STAT        04/09/23 1550   04/09/23 1549  Draw ABG 1 hour after initiation of ventilator  Once,   R        04/09/23 1549   04/09/23 1548  Procalcitonin  Once,   R       References:    Procalcitonin Lower Respiratory Tract Infection AND Sepsis Procalcitonin Algorithm   04/09/23 1549   04/09/23 1508  Blood gas, arterial (at [facility])  Once,   R        04/09/23 1507   04/09/23 1339  Culture, blood (Routine x 2)  BLOOD CULTURE X 2,   R (with STAT occurrences)      04/09/23 1338   04/09/23 1339  Urinalysis, w/ Reflex to Culture (Infection Suspected) -Urine, Clean Catch  Once,   URGENT       Question:  Specimen Source  Answer:  Urine, Clean Catch   04/09/23 1338            Vitals/Pain Today's Vitals   04/09/23 1345 04/09/23 1441 04/09/23 1515 04/09/23 1520  BP: (!) 145/61  (!) 116/50   Pulse:  96  86   Resp: (!) 35  20   Temp: (!) 102 F (38.9 C)  98.4 F (36.9 C) 98.7 F (37.1 C)  TempSrc: Rectal     SpO2: 91%  100%   Weight:  102.2 kg    Height:  5\' 6"  (1.676 m)      Isolation Precautions No active isolations  Medications Medications  vancomycin (VANCOREADY) IVPB 2000 mg/400 mL (2,000 mg Intravenous New Bag/Given 04/09/23 1433)  propofol (DIPRIVAN) 1000 MG/100ML infusion (0 mcg/kg/min  102.2 kg Intravenous Stopped 04/09/23 1543)  fentaNYL in NS (63mcg/ml) infusion-PREMIX (20 mcg/hr Intravenous New Bag/Given 04/09/23 1528)  lactated ringers infusion (has no administration in time range)  acetaminophen (TYLENOL) tablet 650 mg (has no administration in time range)  docusate sodium (COLACE) capsule 100 mg (has no administration in time range)  polyethylene glycol (MIRALAX / GLYCOLAX) packet 17 g (has no administration in time range)  ondansetron (ZOFRAN) injection 4 mg (has no administration in time range)  famotidine (PEPCID) tablet 20 mg (has no administration in time  range)  docusate (COLACE) 50 MG/5ML liquid 100 mg (has no administration in time range)  polyethylene glycol (MIRALAX / GLYCOLAX) packet 17 g (has no administration in time range)  pantoprazole (PROTONIX) injection 40 mg (has no administration in time range)  Oral care mouth rinse (has no administration in time range)  Oral care mouth rinse (has no administration in time range)  fentaNYL (SUBLIMAZE) injection 25 mcg (has no administration in time range)  fentaNYL (SUBLIMAZE) injection 25-100 mcg (has no administration in time range)  piperacillin-tazobactam (ZOSYN) IVPB 3.375 g (0 g Intravenous Stopped 04/09/23 1429)  lactated ringers bolus 1,000 mL (1,000 mLs Intravenous New Bag/Given 04/09/23 1416)  lactated ringers bolus 1,000 mL (0 mLs Intravenous Stopped 04/09/23 1535)  rocuronium (ZEMURON) injection 100 mg (100 mg Intravenous Given 04/09/23 1453)  ketamine 50 mg in normal saline 5 mL (10 mg/mL) syringe (102 mg Intravenous Given 04/09/23 1452)    Mobility non-ambulatory     Focused Assessments Neuro Assessment Handoff:  Swallow screen pass? No          Neuro Assessment:   Neuro Checks:      Has TPA been given? No If patient is a Neuro Trauma and patient is going to OR before floor call report to 4N Charge nurse: [phone] or [phone]   R Recommendations: See Admitting Provider Note  Report given to:   Additional Notes: .

## 2023-04-09 NOTE — ED Triage Notes (Signed)
Pt arrived via EMS, from [facility], found approx 1 hr ago unresponsive. Pale, diaphoretic on EMS arrival, able to speak 3-4 words at a time.     Spo2 85% on [place] 160/90 20 rr 146 CBG Endtidal 50  100.  18 G  L AC 650 tylenol given LR 750

## 2023-04-09 NOTE — H&P (Addendum)
NAME:  Becky Hendricks, MRN:  [id number], DOB:  Oct 31, 1945, LOS: 0 ADMISSION DATE:  04/09/2023, CONSULTATION DATE:  04/09/23 REFERRING MD:  Andria Meuse - EM, CHIEF COMPLAINT:  AMS  History of Present Illness:  77 yo F PMH Chronic hypoxic and hypercarbic resp failure, functional quadriplegia, osteomyelitis, IDDM, diastolic CHF, morbid obesity, moderate protein calorie malnutrition,  prior cardiac arrest who is residing currently at [facility], who was found unresponsive approx 12:00 8/28. On EMS arrival, was more responsive and able to speak a few words.  Had 1 elevated temp captured in ED otherwise normal temps. Hypercarbic on gas but not acidotic. BMP CBC grossly unremarkable. HDS.    She was started on BiPAP for incr WOB and ultimately intubated, has MOST form indicating full code, full scope of offered tx, w exception of feeding tube which was indicated as a time trial only.   PCCM to admit in this setting  Pertinent  Medical History   Obesity Malnutrition Functional quadriplegia Chronic resp failure w hypoxia and hypercarbia Diastolic HF IDDM Prior cardiac arrest Afib on eliquis  Osteomyelitis   Significant Hospital Events: Including procedures, antibiotic start and stop dates in addition to other pertinent events   8/27 AMS, ED, ultimately intubated.   Interim History / Subjective:  Intubated in ED   Objective   Blood pressure (!) 116/50, pulse 86, temperature 98.7 F (37.1 C), resp. rate 20, height 5\' 6"  (1.676 m), weight 102.2 kg, SpO2 100%.    Vent Mode: PRVC FiO2 (%):  [50 %-100 %] 100 % Set Rate:  [20 bmp] 20 bmp Vt Set:  [470 mL] 470 mL PEEP:  [5 cmH20] 5 cmH20 Plateau Pressure:  [24 cmH20] 24 cmH20   Intake/Output Summary (Last 24 hours) at 04/09/2023 1526 Last data filed at 04/09/2023 1429 Gross per 24 hour  Intake 50 ml  Output --  Net 50 ml   Filed Weights   04/09/23 1441  Weight: 102.2 kg    Examination: General: Obese chronically and critically ill F  intubated  HENT: NCAT pink mm ETT secure Lungs: Diminished basilar sounds, no adventitious sounds, mechanically ventilated  Cardiovascular: rr cap refill brisk  Abdomen: obese soft ndnt  Extremities: no acute joint deformity. BLE pitting edema  Neuro: 2mm pupils no response to pain. Possibly still chemically paralyzed  GU: foley yellow urine   Resolved Hospital Problem list     Assessment & Plan:   Acute encephalopathy Hx Bipolar disorder -on VPA and risperidol historically for her BP -no known hx sz that I can see   P -CT H  -check ammonia, UDS, Bcx UA, VPA level  -wean sedation RASS goal 0 after she gets up to the ICU -- not sure if she is still under influence of NMB in ED-- should be worn off by now  -If CT is normal will check EEG   Acute on chronic resp failure w hypercarbia OSA RML opacity (favored to be atelectasis)  COVID +  P -wean MV as able -VAP, pulm hygiene  -usual covid precautions. Getting a CT chest, if it looks like there is some PNA can start steroids   R/o sepsis -1x fever in ED + AMS. No WBC. RML opacity  P -sepsis workup, empiric abx -checking PCT -- if neg then low threshold to dc abx    Hx Afib on eliquis -- sinus  -INR is 1.5  P -restart if CT H without bleed   DM2 with hyperglycemia  -SSI  Hx osteomyelitis Functional quad  Morbid obesity Moderate malnutrition  P -PT/OT/RDN  Full Code as per documents    Best Practice (right click and "Reselect all SmartList Selections" daily)   Diet/type: NPO DVT prophylaxis: SCD GI prophylaxis: PPI Lines: N/A Foley:  Yes, and it is still needed Code Status:  full code Last date of multidisciplinary goals of care discussion [--]  Labs   CBC: Recent Labs  Lab 04/09/23 1336  WBC 10.5  NEUTROABS 6.5  HGB 13.3  HCT 42.9  MCV 99.8  PLT 144*    Basic Metabolic Panel: Recent Labs  Lab 04/09/23 1336  NA 138  K 4.2  CL 94*  CO2 31  GLUCOSE 171*  BUN 16  CREATININE 0.71  CALCIUM  8.9   GFR: Estimated Creatinine Clearance: 73.4 mL/min (by C-G formula based on SCr of 0.71 mg/dL). Recent Labs  Lab 04/09/23 1336 04/09/23 1341  WBC 10.5  --   LATICACIDVEN  --  1.2    Liver Function Tests: Recent Labs  Lab 04/09/23 1336  AST 10*  ALT 12  ALKPHOS 31*  BILITOT 0.7  PROT 6.8  ALBUMIN 3.1*   No results for input(s): "LIPASE", "AMYLASE" in the last 168 hours. No results for input(s): "AMMONIA" in the last 168 hours.  ABG    Component Value Date/Time   PHART 7.417 09/02/2022 0009   PCO2ART 43.4 09/02/2022 0009   PO2ART 66 (L) 09/02/2022 0009   HCO3 36.4 (H) 04/09/2023 1335   TCO2 29 09/02/2022 0009   ACIDBASEDEF 1.0 08/31/2022 1408   O2SAT 82.5 04/09/2023 1335     Coagulation Profile: Recent Labs  Lab 04/09/23 1336  INR 1.5*    Cardiac Enzymes: No results for input(s): "CKTOTAL", "CKMB", "CKMBINDEX", "TROPONINI" in the last 168 hours.  HbA1C: Hgb A1c MFr Bld  Date/Time Value Ref Range Status  03/30/2021 03:11 AM 7.0 (H) 4.8 - 5.6 % Final    Comment:    (NOTE) Pre diabetes:          5.7%-6.4%  Diabetes:              >6.4%  Glycemic control for   <7.0% adults with diabetes   11/18/2020 01:54 AM 6.1 (H) 4.8 - 5.6 % Final    Comment:    (NOTE) Pre diabetes:          5.7%-6.4%  Diabetes:              >6.4%  Glycemic control for   <7.0% adults with diabetes     CBG: No results for input(s): "GLUCAP" in the last 168 hours.  Review of Systems:   Unable to obtain intubated   Past Medical History:  She,  has a past medical history of Acute and chronic respiratory failure with hypercapnia (HCC) (04/10/2021), Acute on chronic respiratory failure with hypoxia and hypercapnia (HCC), Acute respiratory failure (HCC) (03/29/2021), Atrial fibrillation (HCC), Bipolar disorder (HCC), Bipolar disorder (HCC) (04/27/2020), Cardiac arrest (HCC), Clostridium difficile diarrhea (08/02/2019), Colitis (03/29/2021), Dehydration, Drug rash,  Encephalopathy acute (11/18/2020), Essential hypertension, Fall (02/11/2020), Hypoxia, Morbid obesity (HCC), Nausea and vomiting (11/17/2020), Osteomyelitis (HCC) (12/03/2019), Pneumonia due to COVID-19 virus (09/15/2019), Pneumonia of lower lobe due to infectious organism (03/29/2021), Severe sepsis (HCC) (12/03/2019), SIRS (systemic inflammatory response syndrome) (HCC) (11/17/2020), Type 2 diabetes mellitus (HCC), Urinary tract infection without hematuria (09/15/2019), and Weakness.   Surgical History:   Past Surgical History:  Procedure Laterality Date   INCISION AND DRAINAGE PERIRECTAL ABSCESS Left 12/04/2019   Procedure: IRRIGATION AND DEBRIDEMENT LEFT  BUTTOCK ABSCESS;  Surgeon: Violeta Gelinas, MD;  Location: [facility] OR;  Service: General;  Laterality: Left;   INCISION AND DRAINAGE PERIRECTAL ABSCESS Left 12/10/2019   Procedure: REPEAT IRRIGATION AND DEBRIDEMENT BUTTOCK  ABSCESS;  Surgeon: Abigail Miyamoto, MD;  Location: [facility] OR;  Service: General;  Laterality: Left;   IR FLUORO GUIDE CV LINE RIGHT  11/25/2020   IR RADIOLOGIST EVAL & MGMT  12/29/2020   IR US GUIDE VASC ACCESS RIGHT  11/25/2020     Social History:   reports that she has never smoked. She has never used smokeless tobacco. She reports that she does not currently use alcohol. She reports that she does not currently use drugs.   Family History:  Her Family history is unknown by patient.   Allergies Allergies  Allergen Reactions   Chlorhexidine Gluconate Itching   Vancomycin     Vancomycin infusion related reaction- May need Vanc to be given at a slower rate    Acetazolamide Er Rash     Home Medications  Prior to Admission medications   Medication Sig Start Date End Date Taking? Authorizing Provider  apixaban (ELIQUIS) 5 MG TABS tablet Take 5 mg by mouth 2 (two) times daily.   Yes [provider]  atorvastatin (LIPITOR) 10 MG tablet Take 10 mg by mouth at bedtime.   Yes [provider]  calcium carbonate  (TUMS - DOSED IN MG ELEMENTAL CALCIUM) 500 MG chewable tablet Chew 500 mg by mouth in the morning.   Yes [provider]  carvedilol (COREG) 3.125 MG tablet Take 1 tablet (3.125 mg total) by mouth 2 (two) times daily with a meal. 09/08/22 04/09/23 Yes Pahwani, Daleen Bo, MD  clotrimazole (LOTRIMIN) 1 % cream Apply 1 Application topically 2 (two) times daily. Inner upper thighs.   Yes [provider]  divalproex (DEPAKOTE) 125 MG DR tablet Take 125 mg by mouth daily.   Yes [provider]  divalproex (DEPAKOTE) 500 MG DR tablet Take 500 mg by mouth 2 (two) times daily.   Yes [provider]  Emollient (EUCERIN SKIN CALMING) CREA Apply 1 application  topically at bedtime. To arms and dry skin   Yes [provider]  empagliflozin (JARDIANCE) 10 MG TABS tablet Take 10 mg by mouth in the morning.   Yes [provider]  ergocalciferol (VITAMIN D2) 1.25 MG (50000 UT) capsule Take 50,000 Units by mouth See admin instructions. 50,000 units once daily in the morning on Tuesday, Friday.   Yes [provider]  Ipratropium-Albuterol (COMBIVENT RESPIMAT) 20-100 MCG/ACT AERS respimat Inhale 1 puff into the lungs every 12 (twelve) hours.   Yes [provider]  LORazepam (ATIVAN) 0.5 MG tablet Take 0.5 mg by mouth daily. Take 0.5mg  by mouth once a day in addition to twice daily as needed for agitation or anxiety.   Yes [provider]  metFORMIN (GLUCOPHAGE-XR) 750 MG 24 hr tablet Take 750 mg by mouth in the morning and at bedtime.   Yes [provider]  nystatin powder Apply 1 Application topically 2 (two) times daily. Buttocks, peri groin, inner thighs   Yes [provider]  PSYLLIUM HUSK PO Take 0.4 g by mouth 2 (two) times daily.   Yes [provider]  risperiDONE (RISPERDAL) 2 MG tablet Take 2 mg by mouth in the morning.   Yes [provider]  risperiDONE (RISPERDAL) 3 MG tablet Take 3 mg by mouth at  bedtime. 11/09/21  Yes [provider]  spironolactone (ALDACTONE) 25  MG tablet Take 12.5 mg by mouth in the morning. 11/27/21  Yes [provider]  tamsulosin (FLOMAX) 0.4 MG CAPS capsule Take 1 capsule (0.4 mg total) by mouth every other day. 04/20/21  Yes Pennie Banter, DO     Critical care time: 41 min     CRITICAL CARE Performed by: Lanier Clam   Total critical care time: 41 minutes  Critical care time was exclusive of separately billable procedures and treating other patients.  Critical care was necessary to treat or prevent imminent or life-threatening deterioration.  Critical care was time spent personally by me on the following activities: development of treatment plan with patient and/or surrogate as well as nursing, discussions with consultants, evaluation of patient's response to treatment, examination of patient, obtaining history from patient or surrogate, ordering and performing treatments and interventions, ordering and review of laboratory studies, ordering and review of radiographic studies, pulse oximetry and re-evaluation of patient's condition.  Tessie Fass MSN, AGACNP-BC [facility] Pulmonary/Critical Care Medicine Amion for pager  04/09/2023, 3:59 PM

## 2023-04-09 NOTE — Progress Notes (Signed)
Pt placed on BIPAP in ED for WOB and AMS.

## 2023-04-10 DIAGNOSIS — U071 COVID-19: Secondary | ICD-10-CM

## 2023-04-10 DIAGNOSIS — J9601 Acute respiratory failure with hypoxia: Secondary | ICD-10-CM | POA: Diagnosis not present

## 2023-04-10 LAB — GLUCOSE, CAPILLARY
Glucose-Capillary: 122 mg/dL — ABNORMAL HIGH (ref 70–99)
Glucose-Capillary: 139 mg/dL — ABNORMAL HIGH (ref 70–99)
Glucose-Capillary: 141 mg/dL — ABNORMAL HIGH (ref 70–99)
Glucose-Capillary: 148 mg/dL — ABNORMAL HIGH (ref 70–99)
Glucose-Capillary: 178 mg/dL — ABNORMAL HIGH (ref 70–99)
Glucose-Capillary: 214 mg/dL — ABNORMAL HIGH (ref 70–99)

## 2023-04-10 LAB — CBC
HCT: 39.7 % (ref 36.0–46.0)
Hemoglobin: 12.4 g/dL (ref 12.0–15.0)
MCH: 30.8 pg (ref 26.0–34.0)
MCHC: 31.2 g/dL (ref 30.0–36.0)
MCV: 98.8 fL (ref 80.0–100.0)
Platelets: 117 10*3/uL — ABNORMAL LOW (ref 150–400)
RBC: 4.02 MIL/uL (ref 3.87–5.11)
RDW: 13.2 % (ref 11.5–15.5)
WBC: 15.1 10*3/uL — ABNORMAL HIGH (ref 4.0–10.5)
nRBC: 0 % (ref 0.0–0.2)

## 2023-04-10 LAB — RAPID URINE DRUG SCREEN, HOSP PERFORMED
Amphetamines: NOT DETECTED
Barbiturates: NOT DETECTED
Benzodiazepines: NOT DETECTED
Cocaine: NOT DETECTED
Opiates: NOT DETECTED
Tetrahydrocannabinol: NOT DETECTED

## 2023-04-10 LAB — BASIC METABOLIC PANEL
Anion gap: 17 — ABNORMAL HIGH (ref 5–15)
BUN: 19 mg/dL (ref 8–23)
CO2: 22 mmol/L (ref 22–32)
Calcium: 8.6 mg/dL — ABNORMAL LOW (ref 8.9–10.3)
Chloride: 98 mmol/L (ref 98–111)
Creatinine, Ser: 0.7 mg/dL (ref 0.44–1.00)
GFR, Estimated: >60 mL/min (ref >60)
Glucose, Bld: 131 mg/dL — ABNORMAL HIGH (ref 70–99)
Potassium: 4.2 mmol/L (ref 3.5–5.1)
Sodium: 137 mmol/L (ref 135–145)

## 2023-04-10 LAB — RESPIRATORY PANEL BY PCR

## 2023-04-10 LAB — MAGNESIUM: Magnesium: 1.9 mg/dL (ref 1.7–2.4)

## 2023-04-10 MED ORDER — ACETAMINOPHEN 160 MG/5ML PO SOLN: 650.0000 mg | Freq: Four times a day (QID) | ORAL | Status: DC | PRN

## 2023-04-10 MED ORDER — LORAZEPAM 0.5 MG PO TABS
0.5000 mg | ORAL_TABLET | ORAL | Status: DC | PRN
  Administered 2023-04-10 – 2023-04-12 (×6): 0.5 mg via ORAL
  Filled 2023-04-10 (×6): qty 1

## 2023-04-10 MED ORDER — FAMOTIDINE 20 MG PO TABS
20.0000 mg | ORAL_TABLET | Freq: Two times a day (BID) | ORAL | Status: DC
  Administered 2023-04-10 – 2023-04-12 (×4): 20 mg via ORAL
  Filled 2023-04-10 (×3): qty 1

## 2023-04-10 MED ORDER — INSULIN ASPART 100 UNIT/ML IJ SOLN
0.0000 [IU] | Freq: Three times a day (TID) | INTRAMUSCULAR | Status: DC
  Administered 2023-04-11: 3 [IU] via SUBCUTANEOUS
  Administered 2023-04-11: 2 [IU] via SUBCUTANEOUS
  Administered 2023-04-11: 5 [IU] via SUBCUTANEOUS
  Administered 2023-04-12 (×2): 8 [IU] via SUBCUTANEOUS
  Administered 2023-04-12: 3 [IU] via SUBCUTANEOUS

## 2023-04-10 MED ORDER — FUROSEMIDE 10 MG/ML IJ SOLN
40.0000 mg | Freq: Once | INTRAMUSCULAR | Status: AC
  Administered 2023-04-10: 40 mg via INTRAVENOUS
  Filled 2023-04-10: qty 4

## 2023-04-10 MED ORDER — APIXABAN 5 MG PO TABS
5.0000 mg | ORAL_TABLET | Freq: Two times a day (BID) | ORAL | Status: DC
  Administered 2023-04-10 – 2023-04-12 (×4): 5 mg via ORAL
  Filled 2023-04-10 (×3): qty 1

## 2023-04-10 MED ORDER — ORAL CARE MOUTH RINSE: 15.0000 mL | OROMUCOSAL | Status: DC | PRN

## 2023-04-10 MED ORDER — MAGNESIUM SULFATE 2 GM/50ML IV SOLN
2.0000 g | Freq: Once | INTRAVENOUS | Status: AC
  Administered 2023-04-10: 2 g via INTRAVENOUS
  Filled 2023-04-10: qty 50

## 2023-04-10 MED ORDER — INSULIN ASPART 100 UNIT/ML IJ SOLN
0.0000 [IU] | Freq: Every day | INTRAMUSCULAR | Status: DC
  Administered 2023-04-10: 2 [IU] via SUBCUTANEOUS
  Administered 2023-04-11: 3 [IU] via SUBCUTANEOUS

## 2023-04-10 MED ORDER — APIXABAN 5 MG PO TABS
5.0000 mg | ORAL_TABLET | Freq: Two times a day (BID) | ORAL | Status: DC
  Administered 2023-04-10: 5 mg
  Filled 2023-04-10 (×2): qty 1

## 2023-04-10 MED ORDER — RISPERIDONE 1 MG PO TABS
1.0000 mg | ORAL_TABLET | Freq: Every day | ORAL | Status: DC
  Administered 2023-04-10 – 2023-04-11 (×2): 1 mg via ORAL
  Filled 2023-04-10 (×2): qty 1

## 2023-04-10 NOTE — Progress Notes (Signed)
eLink Physician-Brief Progress Note Patient Name: Becky Hendricks DOB: January 28, 1946 MRN: [id number]   Date of Service  04/10/2023  HPI/Events of Note  Patient extubated now on nasal cannula.  Tolerating oral intake.  eICU Interventions  Switch sliding scale insulin from every 4 hours to AC/at bedtime     Intervention Category Minor Interventions: Routine modifications to care plan (e.g. PRN medications for pain, fever)  Breella Vanostrand 04/10/2023, 8:05 PM

## 2023-04-10 NOTE — Progress Notes (Signed)
Becky Hendricks [address] [facility] Liaison note: This patient is currently enrolled in [facility] outpatient-based palliative care. Hospital Liaison will continue to follow for discharge disposition. Please call for any outpatient based palliative care related questions or concerns. Thank you, Thea Gist, BSN [facility] liaison [phone]

## 2023-04-10 NOTE — Progress Notes (Signed)
NAME:  Becky Hendricks, MRN:  [id number], DOB:  1946/03/26, LOS: 1 ADMISSION DATE:  04/09/2023, CONSULTATION DATE:  04/09/23 REFERRING MD:  Andria Meuse - EM, CHIEF COMPLAINT:  AMS  History of Present Illness:  77 yo F PMH Chronic hypoxic and hypercarbic resp failure, functional quadriplegia, osteomyelitis, IDDM, diastolic CHF, morbid obesity, moderate protein calorie malnutrition,  prior cardiac arrest who is residing currently at [facility], who was found unresponsive approx 12:00 8/28. On EMS arrival, was more responsive and able to speak a few words.  Had 1 elevated temp captured in ED otherwise normal temps. Hypercarbic on gas but not acidotic. BMP CBC grossly unremarkable. HDS.    She was started on BiPAP for incr WOB and ultimately intubated, has MOST form indicating full code, full scope of offered tx, w exception of feeding tube which was indicated as a time trial only.   PCCM to admit in this setting  Pertinent  Medical History   Obesity Malnutrition Functional quadriplegia Chronic resp failure w hypoxia and hypercarbia Diastolic HF IDDM Prior cardiac arrest Afib on eliquis  Osteomyelitis   Significant Hospital Events: Including procedures, antibiotic start and stop dates in addition to other pertinent events   8/27 AMS, ED, ultimately intubated. +Covid.  8/28 WUA/SBT   Interim History / Subjective:  Febrile for a few hours overnight, then normal   Objective   Blood pressure (!) 123/50, pulse (!) 57, temperature 98.2 F (36.8 C), resp. rate 20, height 5\' 6"  (1.676 m), weight 96.8 kg, SpO2 98%.    Vent Mode: PSV FiO2 (%):  [30 %-100 %] 30 % Set Rate:  [20 bmp] 20 bmp Vt Set:  [470 mL] 470 mL PEEP:  [5 cmH20] 5 cmH20 Pressure Support:  [8 cmH20] 8 cmH20 Plateau Pressure:  [20 cmH20-24 cmH20] 20 cmH20   Intake/Output Summary (Last 24 hours) at 04/10/2023 0811 Last data filed at 04/10/2023 0731 Gross per 24 hour  Intake 3534.36 ml  Output 900 ml  Net 2634.36 ml   Filed  Weights   04/09/23 1441 04/09/23 1731 04/10/23 0449  Weight: 102.2 kg 98.2 kg 96.8 kg    Examination: General: Obese chronically and critically ill appearing F intubated lightly sedated  HENT: NCAT pink mm ETT secure anicteric sclera  Lungs: Diminished, no wheeze no rhonchi. Mechanically ventilated  Cardiovascular: rr cap refill < 3 sec  Abdomen: obese soft ndnt  Extremities: BLE pitting edema  Neuro: Awake, following commands. Tremor  GU: yellow urine   Resolved Hospital Problem list     Assessment & Plan:   Acute encephalopathy Hx bipolar disorder  -on VPA and risperidol historically for her BP -CT H without acute abnormality, ceribell wo sz, VPA WNL, ammonia WNL, UDS neg. -was neither profoundly hypoxemic or hypercarbic, not sure what caused her significant AMS. Wonder if polypharmacy?   P -wean sedation  -delirium precautions   AoC resp failure with hypercarbia OSA COVID infection  Possible aspiration PNA -PCT is <0.1, would argue against bacterial infection  P -WUA/SBT, hopeful extubation 8/28 -usual covid precautions -steroids -short course abx ok   Hx Afib on eliquis HTN P -restart eliquis  -will give her 40 mg lasix 8/28 -can add add'l PRNs if needed   DM2 with hyperglycemia  -SSI  Hx osteomyelitis Functional quad Morbid obesity Moderate malnutrition  P -PT/OT/RDN  Full Code as per documents    Best Practice (right click and "Reselect all SmartList Selections" daily)   Diet/type: NPO DVT prophylaxis: SCD, restart eliquis  GI prophylaxis:  PPI Lines: N/A Foley:  Yes, and it is still needed Code Status:  full code Last date of multidisciplinary goals of care discussion [--]  Labs   CBC: Recent Labs  Lab 04/09/23 1336 04/10/23 0306  WBC 10.5 15.1*  NEUTROABS 6.5  --   HGB 13.3 12.4  HCT 42.9 39.7  MCV 99.8 98.8  PLT 144* 117*    Basic Metabolic Panel: Recent Labs  Lab 04/09/23 1336 04/10/23 0306  NA 138 137  K 4.2 4.2  CL  94* 98  CO2 31 22  GLUCOSE 171* 131*  BUN 16 19  CREATININE 0.71 0.70  CALCIUM 8.9 8.6*  MG  --  1.9   GFR: Estimated Creatinine Clearance: 71.3 mL/min (by C-G formula based on SCr of 0.7 mg/dL). Recent Labs  Lab 04/09/23 1336 04/09/23 1341 04/09/23 1738 04/10/23 0306  PROCALCITON  --   --  <0.10  --   WBC 10.5  --   --  15.1*  LATICACIDVEN  --  1.2  --   --     Liver Function Tests: Recent Labs  Lab 04/09/23 1336  AST 10*  ALT 12  ALKPHOS 31*  BILITOT 0.7  PROT 6.8  ALBUMIN 3.1*   No results for input(s): "LIPASE", "AMYLASE" in the last 168 hours. Recent Labs  Lab 04/09/23 1738  AMMONIA 18    ABG    Component Value Date/Time   PHART 7.39 04/09/2023 1600   PCO2ART 55 (H) 04/09/2023 1600   PO2ART 163 (H) 04/09/2023 1600   HCO3 32.8 (H) 04/09/2023 1600   TCO2 29 09/02/2022 0009   ACIDBASEDEF 1.0 08/31/2022 1408   O2SAT 100 04/09/2023 1600     Coagulation Profile: Recent Labs  Lab 04/09/23 1336  INR 1.5*    Cardiac Enzymes: No results for input(s): "CKTOTAL", "CKMB", "CKMBINDEX", "TROPONINI" in the last 168 hours.  HbA1C: Hgb A1c MFr Bld  Date/Time Value Ref Range Status  04/09/2023 05:47 PM 7.1 (H) 4.8 - 5.6 % Final    Comment:    (NOTE) Pre diabetes:          5.7%-6.4%  Diabetes:              >6.4%  Glycemic control for   <7.0% adults with diabetes   03/30/2021 03:11 AM 7.0 (H) 4.8 - 5.6 % Final    Comment:    (NOTE) Pre diabetes:          5.7%-6.4%  Diabetes:              >6.4%  Glycemic control for   <7.0% adults with diabetes     CBG: Recent Labs  Lab 04/09/23 1831 04/09/23 2052 04/10/23 0050 04/10/23 0423 04/10/23 0730  GLUCAP 123* 137* 148* 139* 122*    CRITICAL CARE Performed by: Lanier Clam   Total critical care time: 38 minutes  Critical care time was exclusive of separately billable procedures and treating other patients. Critical care was necessary to treat or prevent imminent or life-threatening  deterioration.  Critical care was time spent personally by me on the following activities: development of treatment plan with patient and/or surrogate as well as nursing, discussions with consultants, evaluation of patient's response to treatment, examination of patient, obtaining history from patient or surrogate, ordering and performing treatments and interventions, ordering and review of laboratory studies, ordering and review of radiographic studies, pulse oximetry and re-evaluation of patient's condition.  Tessie Fass MSN, AGACNP-BC [facility] Pulmonary/Critical Care Medicine Amion for pager  04/10/2023,  8:11 AM

## 2023-04-10 NOTE — Plan of Care (Signed)
  Problem: Activity: Goal: Ability to tolerate increased activity will improve Outcome: Progressing   Problem: Respiratory: Goal: Ability to maintain a clear airway and adequate ventilation will improve Outcome: Progressing   Problem: Role Relationship: Goal: Method of communication will improve Outcome: Progressing   Problem: Education: Goal: Knowledge of General Education information will improve Description: Including pain rating scale, medication(s)/side effects and non-pharmacologic comfort measures Outcome: Progressing   Problem: Health Behavior/Discharge Planning: Goal: Ability to manage health-related needs will improve Outcome: Progressing   Problem: Clinical Measurements: Goal: Ability to maintain clinical measurements within normal limits will improve Outcome: Progressing Goal: Will remain free from infection Outcome: Progressing Goal: Respiratory complications will improve Outcome: Progressing   Problem: Activity: Goal: Risk for activity intolerance will decrease Outcome: Progressing   Problem: Nutrition: Goal: Adequate nutrition will be maintained Outcome: Progressing

## 2023-04-10 NOTE — Procedures (Signed)
Extubation Procedure Note  Patient Details:   Name: Becky Hendricks DOB: 22-Mar-1946 MRN: [id number]   Airway Documentation:    Vent end date: 04/10/23 Vent end time: 1110   Evaluation  O2 sats: stable throughout Complications: No apparent complications Patient did tolerate procedure well. Bilateral Breath Sounds: Diminished, Coarse crackles  Pt placed on 4L. She is able to speak.  Renold Genta 04/10/2023, 11:11 AM

## 2023-04-10 NOTE — Progress Notes (Signed)
[facility] ADULT ICU REPLACEMENT PROTOCOL   The patient does apply for the [facility] Adult ICU Electrolyte Replacment Protocol based on the criteria listed below:   1.Exclusion criteria: TCTS, ECMO, Dialysis, and Myasthenia Gravis patients 2. Is GFR >/= 30 ml/min? Yes.    Patient's GFR today is >60 3. Is SCr </= 2? Yes.   Patient's SCr is 0.70 mg/dL 4. Did SCr increase >/= 0.5 in 24 hours? No. 5.Pt's weight >40kg  Yes.   6. Abnormal electrolyte(s): mag 1.9  7. Electrolytes replaced per protocol 8.  Call MD STAT for K+ </= 2.5, Phos </= 1, or Mag </= 1 Physician:  protocol  Melvern Banker 04/10/2023 5:16 AM

## 2023-04-11 DIAGNOSIS — J9621 Acute and chronic respiratory failure with hypoxia: Secondary | ICD-10-CM | POA: Diagnosis not present

## 2023-04-11 DIAGNOSIS — J9622 Acute and chronic respiratory failure with hypercapnia: Secondary | ICD-10-CM

## 2023-04-11 DIAGNOSIS — U071 COVID-19: Secondary | ICD-10-CM

## 2023-04-11 DIAGNOSIS — I480 Paroxysmal atrial fibrillation: Secondary | ICD-10-CM

## 2023-04-11 LAB — BASIC METABOLIC PANEL
Anion gap: 12 (ref 5–15)
BUN: 22 mg/dL (ref 8–23)
CO2: 30 mmol/L (ref 22–32)
Calcium: 8.4 mg/dL — ABNORMAL LOW (ref 8.9–10.3)
Chloride: 95 mmol/L — ABNORMAL LOW (ref 98–111)
Creatinine, Ser: 0.68 mg/dL (ref 0.44–1.00)
GFR, Estimated: >60 mL/min (ref >60)
Glucose, Bld: 151 mg/dL — ABNORMAL HIGH (ref 70–99)
Potassium: 4 mmol/L (ref 3.5–5.1)
Sodium: 137 mmol/L (ref 135–145)

## 2023-04-11 LAB — GLUCOSE, CAPILLARY
Glucose-Capillary: 122 mg/dL — ABNORMAL HIGH (ref 70–99)
Glucose-Capillary: 190 mg/dL — ABNORMAL HIGH (ref 70–99)
Glucose-Capillary: 217 mg/dL — ABNORMAL HIGH (ref 70–99)
Glucose-Capillary: 272 mg/dL — ABNORMAL HIGH (ref 70–99)

## 2023-04-11 LAB — CBC
HCT: 39.2 % (ref 36.0–46.0)
Hemoglobin: 12.2 g/dL (ref 12.0–15.0)
MCH: 30.2 pg (ref 26.0–34.0)
MCHC: 31.1 g/dL (ref 30.0–36.0)
MCV: 97 fL (ref 80.0–100.0)
Platelets: 126 10*3/uL — ABNORMAL LOW (ref 150–400)
RBC: 4.04 MIL/uL (ref 3.87–5.11)
RDW: 13.2 % (ref 11.5–15.5)
WBC: 10.8 10*3/uL — ABNORMAL HIGH (ref 4.0–10.5)
nRBC: 0 % (ref 0.0–0.2)

## 2023-04-11 LAB — MAGNESIUM: Magnesium: 2.4 mg/dL (ref 1.7–2.4)

## 2023-04-11 MED ORDER — DIVALPROEX SODIUM 125 MG PO DR TAB
125.0000 mg | DELAYED_RELEASE_TABLET | Freq: Every day | ORAL | Status: DC
  Administered 2023-04-11 – 2023-04-12 (×2): 125 mg via ORAL
  Filled 2023-04-11 (×2): qty 1

## 2023-04-11 MED ORDER — DIVALPROEX SODIUM 500 MG PO DR TAB
500.0000 mg | DELAYED_RELEASE_TABLET | Freq: Two times a day (BID) | ORAL | Status: DC
  Administered 2023-04-11 – 2023-04-12 (×3): 500 mg via ORAL
  Filled 2023-04-11 (×4): qty 1

## 2023-04-11 MED ORDER — ATORVASTATIN CALCIUM 10 MG PO TABS
10.0000 mg | ORAL_TABLET | Freq: Every day | ORAL | Status: DC
  Administered 2023-04-11: 10 mg via ORAL
  Filled 2023-04-11: qty 1

## 2023-04-11 MED ORDER — CARVEDILOL 3.125 MG PO TABS
3.1250 mg | ORAL_TABLET | Freq: Two times a day (BID) | ORAL | Status: DC
  Administered 2023-04-11 – 2023-04-12 (×3): 3.125 mg via ORAL
  Filled 2023-04-11 (×3): qty 1

## 2023-04-11 NOTE — Hospital Course (Signed)
Ms. Henegar is a 77 yo female with PMH afib, bipolar d/o, cardiac arrest, HTN, morbid obesity, DMII, functional quadriplegia, chronic hypoxic and hypercarbic respiratory failure who lives at [facility] and was found unresponsive.  She was found to be hypercarbic on blood gas but not acidotic.  She was placed on BiPAP initially for work of breathing but continued to have progressive respiratory distress and required intubation. She was monitored in the ICU and able to be extubated on 04/10/2023.  Workup was also notable for COVID infection and questionable aspiration/pneumonia.  She was placed on empiric antibiotics for coverage.

## 2023-04-11 NOTE — Plan of Care (Signed)
  Problem: Activity: Goal: Ability to tolerate increased activity will improve Outcome: Progressing   Problem: Respiratory: Goal: Ability to maintain a clear airway and adequate ventilation will improve Outcome: Progressing   Problem: Role Relationship: Goal: Method of communication will improve Outcome: Progressing   Problem: Education: Goal: Knowledge of General Education information will improve Description: Including pain rating scale, medication(s)/side effects and non-pharmacologic comfort measures Outcome: Progressing   Problem: Health Behavior/Discharge Planning: Goal: Ability to manage health-related needs will improve Outcome: Progressing   Problem: Clinical Measurements: Goal: Ability to maintain clinical measurements within normal limits will improve Outcome: Progressing Goal: Will remain free from infection Outcome: Progressing Goal: Diagnostic test results will improve Outcome: Progressing   Problem: Activity: Goal: Risk for activity intolerance will decrease Outcome: Progressing   Problem: Nutrition: Goal: Adequate nutrition will be maintained Outcome: Progressing   Problem: Pain Managment: Goal: General experience of comfort will improve Outcome: Progressing

## 2023-04-11 NOTE — Assessment & Plan Note (Signed)
-   resume depakote

## 2023-04-11 NOTE — Assessment & Plan Note (Addendum)
-   resume home regimen 

## 2023-04-11 NOTE — Assessment & Plan Note (Addendum)
-   continue steroids - patient started on empiric abx course due to concern for aspiration on admission as well

## 2023-04-11 NOTE — Assessment & Plan Note (Addendum)
-   in setting of covid 19 infection and possible underlying pna/aspiration - continue empiric abx and complete course; d/c with augmentin to complete - continue steroids; finish with decadron course

## 2023-04-11 NOTE — Assessment & Plan Note (Addendum)
-   Continue Eliquis and coreg

## 2023-04-11 NOTE — Evaluation (Signed)
Physical Therapy Evaluation Patient Details Name: Becky Hendricks MRN: [id number] DOB: 1946/04/02 Today's Date: 04/11/2023  History of Present Illness  Becky Hendricks is a 77 yo female with PMH afib, bipolar d/o, cardiac arrest, HTN, morbid obesity, DMII, functional quadriplegia, chronic hypoxic and hypercarbic respiratory failure who lives at [facility] and was found unresponsive 04/09/23.  She was found to be hypercarbic on blood gas but not acidotic.  She was placed on BiPAP initially for work of breathing but continued to have progressive respiratory distress and required intubation.  She was monitored in the ICU and able to be extubated on 04/10/2023. Noted positive for Covid and pneumonia.  Clinical Impression  Patient well known to this writer from previous admissions. Patient  is a long term resident, requires total assistance for mobility, Hoyer lifted OOB. Palm protector for R hand provided.    PT will sign off.      If plan is discharge home, recommend the following: Two people to help with walking and/or transfers;Two people to help with bathing/dressing/bathroom;Assistance with feeding   Can travel by private vehicle   No    Equipment Recommendations None recommended by PT  Recommendations for Other Services       Functional Status Assessment Patient has not had a recent decline in their functional status     Precautions / Restrictions Precautions Precautions: Fall      Mobility  Bed Mobility Overal bed mobility: Needs Assistance Bed Mobility: Supine to Sit, Sit to Supine     Supine to sit: Total assist, HOB elevated Sit to supine: Total assist, HOB elevated   General bed mobility comments: assisted legs to bed edge, much effort to sit upright., back to bed with [facility] raised    Transfers                   General transfer comment: requires mechanical lift    Ambulation/Gait                  Stairs            Wheelchair Mobility      Tilt Bed    Modified Rankin (Stroke Patients Only)       Balance Overall balance assessment: Needs assistance Sitting-balance support: Bilateral upper extremity supported, Feet unsupported Sitting balance-Leahy Scale: Zero                                       Pertinent Vitals/Pain Pain Assessment Pain Assessment: No/denies pain    Home Living Family/patient expects to be discharged to:: Skilled nursing facility                   Additional Comments: patient has been in LTC for several years    Prior Function               Mobility Comments: requires Michiel Sites  and total care ADLs Comments: able to     Extremity/Trunk Assessment   Upper Extremity Assessment Upper Extremity Assessment: RUE deficits/detail;LUE deficits/detail RUE Deficits / Details: finger contractures. limited extension LUE Deficits / Details: able to feed self and hold cup    Lower Extremity Assessment Lower Extremity Assessment: RLE deficits/detail;LLE deficits/detail RLE Deficits / Details: able to lift leg from bed, plantarflexed  contractures LLE Deficits / Details: similar to right    Cervical / Trunk Assessment Cervical / Trunk Assessment: Normal  Communication  Communication Communication: No apparent difficulties  Cognition Arousal: Alert Behavior During Therapy: WFL for tasks assessed/performed Overall Cognitive Status: History of cognitive impairments - at baseline                                 General Comments: able to follow directions        General Comments      Exercises     Assessment/Plan    PT Assessment Patient does not need any further PT services  PT Problem List         PT Treatment Interventions      PT Goals (Current goals can be found in the Care Plan section)  Acute Rehab PT Goals PT Goal Formulation: All assessment and education complete, DC therapy    Frequency       Co-evaluation                AM-PAC PT "6 Clicks" Mobility  Outcome Measure Help needed turning from your back to your side while in a flat bed without using bedrails?: Total Help needed moving from lying on your back to sitting on the side of a flat bed without using bedrails?: Total Help needed moving to and from a bed to a chair (including a wheelchair)?: Total Help needed standing up from a chair using your arms (e.g., wheelchair or bedside chair)?: Total Help needed to walk in hospital room?: Total Help needed climbing 3-5 steps with a railing? : Total 6 Click Score: 6    End of Session   Activity Tolerance: Patient tolerated treatment well Patient left: in bed;with call bell/phone within reach;with bed alarm set Nurse Communication: Mobility status;Need for lift equipment PT Visit Diagnosis: Muscle weakness (generalized) (M62.81);Other symptoms and signs involving the nervous system (R29.898)    Time: 1610-9604 PT Time Calculation (min) (ACUTE ONLY): 38 min   Charges:   PT Evaluation $PT Eval Low Complexity: 1 Low PT Treatments $Therapeutic Activity: 8-22 mins $Self Care/Home Management: 8-22 PT General Charges $$ ACUTE PT VISIT: 1 Visit         Blanchard Kelch PT Acute Rehabilitation Services Office [phone] Weekend [phone]   Rada Hay 04/11/2023, 2:31 PM

## 2023-04-11 NOTE — Assessment & Plan Note (Signed)
-   found unresponsive and ultimately was intubated on admission; now s/p extubation on 8/28 - she is awake and alert today

## 2023-04-11 NOTE — Progress Notes (Signed)
Progress Note    Becky Hendricks   GUY:[id number]  DOB: [birth year]  DOA: 04/09/2023     2 PCP: Patient, No Pcp Per  Initial CC: found unresponsive   Hospital Course: Becky Hendricks is a 77 yo female with PMH afib, bipolar d/o, cardiac arrest, HTN, morbid obesity, DMII, functional quadriplegia, chronic hypoxic and hypercarbic respiratory failure who lives at [facility] and was found unresponsive.  She was found to be hypercarbic on blood gas but not acidotic.  She was placed on BiPAP initially for work of breathing but continued to have progressive respiratory distress and required intubation. She was monitored in the ICU and able to be extubated on 04/10/2023.  Workup was also notable for COVID infection and questionable aspiration/pneumonia.  She was placed on empiric antibiotics for coverage.  Interval History:  Resting in bed when seen this morning.  Slightly tearful at being sick and in the hospital.  Denies much of a cough and denies any shortness of breath.  No chest pain or palpitations.  Assessment and Plan: * Acute on chronic respiratory failure with hypoxia and hypercapnia (HCC) - in setting of covid 19 infection and possible underlying pna/aspiration - continue empiric abx and complete course - continue steroids  COVID-19 virus infection - continue steroids - continue precautions - patient started on empiric abx course due to concern for aspiration on admission as well   Encephalopathy acute-resolved as of 04/11/2023 - found unresponsive and ultimately was intubated on admission; now s/p extubation on 8/28 - she is awake and alert today  Functional quadriplegia (HCC) - bedbound at baseline - resides at [facility]  DMII (diabetes mellitus, type 2) (HCC) - continue SSI and CBG monitoring   Mixed diabetic hyperlipidemia associated with type 2 diabetes mellitus (HCC) - On Lipitor outpatient  Bipolar disorder (HCC) - resume depakote  Obesity (BMI 30-39.9) -  Complicates overall prognosis and care - Body mass index is 34.34 kg/m.  AF (paroxysmal atrial fibrillation) (HCC) - Continue Eliquis - Resume Coreg today given RVR   Old records reviewed in assessment of this patient  Antimicrobials: Azithromycin 04/09/2023 >> current Zosyn 04/09/2023 >> current  DVT prophylaxis:  SCDs Start: 04/09/23 1547 apixaban (ELIQUIS) tablet 5 mg   Code Status:   Code Status: Full Code  Mobility Assessment (Last 72 Hours)     Mobility Assessment   No documentation.           Barriers to discharge: none Disposition Plan:  [name] [facility] Status is: Inpt  Objective: Blood pressure (!) 142/108, pulse (!) 54, temperature 98.1 F (36.7 C), resp. rate (!) 27, height 5\' 6"  (1.676 m), weight 96.5 kg, SpO2 98%.  Examination:  Physical Exam Constitutional:      General: She is not in acute distress.    Appearance: She is obese.  HENT:     Head: Normocephalic and atraumatic.     Mouth/Throat:     Mouth: Mucous membranes are moist.  Eyes:     Extraocular Movements: Extraocular movements intact.  Cardiovascular:     Rate and Rhythm: Tachycardia present. Rhythm irregular.  Pulmonary:     Effort: Pulmonary effort is normal. No respiratory distress.     Breath sounds: No wheezing.  Abdominal:     General: Bowel sounds are normal. There is no distension.     Palpations: Abdomen is soft.     Tenderness: There is no abdominal tenderness.  Musculoskeletal:        General: Normal range of motion.  Cervical back: Normal range of motion and neck supple.  Skin:    General: Skin is warm and dry.  Neurological:     Mental Status: She is alert. Mental status is at baseline.  Psychiatric:        Mood and Affect: Mood normal.      Consultants:    Procedures:    Data Reviewed: Results for orders placed or performed during the hospital encounter of 04/09/23 (from the past 24 hour(s))  Glucose, capillary     Status: Abnormal   Collection Time:  04/10/23  3:37 PM  Result Value Ref Range   Glucose-Capillary 178 (H) 70 - 99 mg/dL   Comment 1 Notify RN    Comment 2 Document in Chart   Glucose, capillary     Status: Abnormal   Collection Time: 04/10/23  9:01 PM  Result Value Ref Range   Glucose-Capillary 214 (H) 70 - 99 mg/dL  CBC     Status: Abnormal   Collection Time: 04/11/23  3:07 AM  Result Value Ref Range   WBC 10.8 (H) 4.0 - 10.5 K/uL   RBC 4.04 3.87 - 5.11 MIL/uL   Hemoglobin 12.2 12.0 - 15.0 g/dL   HCT 13.0 86.5 - 78.4 %   MCV 97.0 80.0 - 100.0 fL   MCH 30.2 26.0 - 34.0 pg   MCHC 31.1 30.0 - 36.0 g/dL   RDW 69.6 29.5 - 28.4 %   Platelets 126 (L) 150 - 400 K/uL   nRBC 0.0 0.0 - 0.2 %  Basic metabolic panel     Status: Abnormal   Collection Time: 04/11/23  3:07 AM  Result Value Ref Range   Sodium 137 135 - 145 mmol/L   Potassium 4.0 3.5 - 5.1 mmol/L   Chloride 95 (L) 98 - 111 mmol/L   CO2 30 22 - 32 mmol/L   Glucose, Bld 151 (H) 70 - 99 mg/dL   BUN 22 8 - 23 mg/dL   Creatinine, Ser 1.32 0.44 - 1.00 mg/dL   Calcium 8.4 (L) 8.9 - 10.3 mg/dL   GFR, Estimated >44 >01 mL/min   Anion gap 12 5 - 15  Magnesium     Status: None   Collection Time: 04/11/23  3:07 AM  Result Value Ref Range   Magnesium 2.4 1.7 - 2.4 mg/dL  Glucose, capillary     Status: Abnormal   Collection Time: 04/11/23  7:14 AM  Result Value Ref Range   Glucose-Capillary 122 (H) 70 - 99 mg/dL    I have reviewed pertinent nursing notes, vitals, labs, and images as necessary. I have ordered labwork to follow up on as indicated.  I have reviewed the last notes from staff over past 24 hours. I have discussed patient's care plan and test results with nursing staff, CM/SW, and other staff as appropriate.  Time spent: Greater than 50% of the 55 minute visit was spent in counseling/coordination of care for the patient as laid out in the A&P.   LOS: 2 days   Lewie Chamber, MD [facility] 04/11/2023, 1:54 PM

## 2023-04-11 NOTE — Assessment & Plan Note (Signed)
-   Complicates overall prognosis and care - Body mass index is 34.34 kg/m.

## 2023-04-11 NOTE — Care Management (Signed)
Transition of Care [facility]) - Inpatient Brief Assessment   Patient Details  Name: Becky Hendricks MRN: [id number] Date of Birth: 1945-11-03  [facility]) CM/SW Contact:    Lavenia Atlas, RN Phone Number: 04/11/2023, 6:46 PM   Clinical Narrative: Per chart review patient currently in [facility] ICU for acute on chronic respiratory failure and hypercapnia. Patient is from [facility] for long term care with outpatient palliative services with [facility]. Received TOC consult for return to SNF/ outpt palliative services.  TOC will continue to follow for discharge needs.    Transition of Care Asessment: Insurance and Status: Insurance coverage has been reviewed Patient has primary care physician: No Home environment has been reviewed: from [facility] and Rehab Prior level of function:: long term care SNF with outpatietn palliative services Prior/Current Home Services: Current home services Social Determinants of Health Reivew: SDOH reviewed no interventions necessary Readmission risk has been reviewed: Yes Transition of care needs: transition of care needs identified, TOC will continue to follow

## 2023-04-11 NOTE — Assessment & Plan Note (Signed)
-   bedbound at baseline - resides at [facility]

## 2023-04-11 NOTE — Assessment & Plan Note (Signed)
-   On Lipitor outpatient

## 2023-04-12 DIAGNOSIS — U071 COVID-19: Secondary | ICD-10-CM | POA: Diagnosis not present

## 2023-04-12 DIAGNOSIS — J9621 Acute and chronic respiratory failure with hypoxia: Secondary | ICD-10-CM | POA: Diagnosis not present

## 2023-04-12 DIAGNOSIS — G9340 Encephalopathy, unspecified: Secondary | ICD-10-CM | POA: Diagnosis not present

## 2023-04-12 DIAGNOSIS — I480 Paroxysmal atrial fibrillation: Secondary | ICD-10-CM | POA: Diagnosis not present

## 2023-04-12 LAB — BASIC METABOLIC PANEL
Anion gap: 9 (ref 5–15)
BUN: 31 mg/dL — ABNORMAL HIGH (ref 8–23)
CO2: 31 mmol/L (ref 22–32)
Calcium: 8.6 mg/dL — ABNORMAL LOW (ref 8.9–10.3)
Chloride: 100 mmol/L (ref 98–111)
Creatinine, Ser: 0.54 mg/dL (ref 0.44–1.00)
GFR, Estimated: >60 mL/min (ref >60)
Glucose, Bld: 184 mg/dL — ABNORMAL HIGH (ref 70–99)
Potassium: 4.3 mmol/L (ref 3.5–5.1)
Sodium: 140 mmol/L (ref 135–145)

## 2023-04-12 LAB — GLUCOSE, CAPILLARY
Glucose-Capillary: 155 mg/dL — ABNORMAL HIGH (ref 70–99)
Glucose-Capillary: 268 mg/dL — ABNORMAL HIGH (ref 70–99)
Glucose-Capillary: 236 mg/dL — ABNORMAL HIGH (ref 70–99)

## 2023-04-12 LAB — CBC WITH DIFFERENTIAL/PLATELET
Abs Immature Granulocytes: 0.1 10*3/uL — ABNORMAL HIGH (ref 0.00–0.07)
Basophils Absolute: 0.1 10*3/uL (ref 0.0–0.1)
Basophils Relative: 1 %
Eosinophils Absolute: 0.3 10*3/uL (ref 0.0–0.5)
Eosinophils Relative: 3 %
HCT: 41.4 % (ref 36.0–46.0)
Hemoglobin: 12.5 g/dL (ref 12.0–15.0)
Immature Granulocytes: 1 %
Lymphocytes Relative: 25 %
Lymphs Abs: 2.3 10*3/uL (ref 0.7–4.0)
MCH: 30.3 pg (ref 26.0–34.0)
MCHC: 30.2 g/dL (ref 30.0–36.0)
MCV: 100.2 fL — ABNORMAL HIGH (ref 80.0–100.0)
Monocytes Absolute: 1.1 10*3/uL — ABNORMAL HIGH (ref 0.1–1.0)
Monocytes Relative: 12 %
Neutro Abs: 5.6 10*3/uL (ref 1.7–7.7)
Neutrophils Relative %: 58 %
Platelets: 143 10*3/uL — ABNORMAL LOW (ref 150–400)
RBC: 4.13 MIL/uL (ref 3.87–5.11)
RDW: 13.5 % (ref 11.5–15.5)
WBC: 9.3 10*3/uL (ref 4.0–10.5)
nRBC: 0 % (ref 0.0–0.2)

## 2023-04-12 LAB — MAGNESIUM: Magnesium: 2.6 mg/dL — ABNORMAL HIGH (ref 1.7–2.4)

## 2023-04-12 MED ORDER — RISPERIDONE 1 MG PO TABS
2.0000 mg | ORAL_TABLET | Freq: Every morning | ORAL | Status: DC
  Administered 2023-04-12: 2 mg via ORAL
  Filled 2023-04-12: qty 2

## 2023-04-12 MED ORDER — LORAZEPAM 0.5 MG PO TABS
0.5000 mg | ORAL_TABLET | Freq: Every day | ORAL | Status: DC
  Administered 2023-04-12: 0.5 mg via ORAL
  Filled 2023-04-12: qty 1

## 2023-04-12 MED ORDER — DEXAMETHASONE 6 MG PO TABS: 6.0000 mg | ORAL_TABLET | Freq: Two times a day (BID) | ORAL | Status: DC

## 2023-04-12 MED ORDER — RISPERIDONE 1 MG PO TABS: 3.0000 mg | ORAL_TABLET | Freq: Every day | ORAL | Status: DC

## 2023-04-12 MED ORDER — HYDRALAZINE HCL 20 MG/ML IJ SOLN
10.0000 mg | INTRAMUSCULAR | Status: DC | PRN
  Administered 2023-04-12: 10 mg via INTRAVENOUS
  Filled 2023-04-12: qty 1

## 2023-04-12 MED ORDER — AMOXICILLIN-POT CLAVULANATE 875-125 MG PO TABS: 1.0000 | ORAL_TABLET | Freq: Two times a day (BID) | ORAL | Status: AC

## 2023-04-12 MED ORDER — LORAZEPAM 0.5 MG PO TABS: 0.5000 mg | ORAL_TABLET | Freq: Every day | ORAL | 0 refills | Status: DC

## 2023-04-12 MED ORDER — DEXAMETHASONE 6 MG PO TABS: 6.0000 mg | ORAL_TABLET | Freq: Every day | ORAL | Status: AC

## 2023-04-12 NOTE — Progress Notes (Signed)
Pt very emotional. BP elevated. Pt comforted. Asked if she was anxious or in pain. Pt stated "no." Pt comforted with therapeutic touch & talking with pt.

## 2023-04-12 NOTE — Progress Notes (Signed)
Pt crying & screaming in room. BP elevated. Dr. Frederick Peers notified of agitation. Ordered Ativan 0.5mg  & Risperidone 2 mg. See [facility]

## 2023-04-12 NOTE — TOC Transition Note (Signed)
Transition of Care [facility]) - CM/SW Discharge Note   Patient Details  Name: Becky Hendricks MRN: [id number] Date of Birth: 13-Nov-1945  Transition of [facility]) CM/SW Contact:  Larrie Kass, LCSW Phone Number: 04/12/2023, 1:31 PM   Clinical Narrative:    Pt to d/c back to [facility], call report [phone], PTAR called. TOC sign off.    Final next level of care: Long Term Nursing Home Barriers to Discharge: No Barriers Identified   Patient Goals and CMS Choice      Discharge Placement                  Patient to be transferred to facility by: EMS Name of family member notified: [facility] Patient and family notified of of transfer: 04/12/23  Discharge Plan and Services Additional resources added to the After Visit Summary for                                       Social Determinants of Health (SDOH) Interventions SDOH Screenings   Tobacco Use: Low Risk  (04/09/2023)     Readmission Risk Interventions    04/11/2023    6:43 PM 04/11/2023    2:05 PM  Readmission Risk Prevention Plan  Transportation Screening Complete Complete  PCP or Specialist Appt within 5-7 Days Complete   Not Complete comments from [facility] Re[facility]   Home Care Screening Complete Complete  Medication Review (RN CM) Complete Complete

## 2023-04-12 NOTE — Discharge Summary (Signed)
Physician Discharge Summary   Becky Hendricks XBM:[id number] DOB: 1946-01-28 DOA: 04/09/2023  PCP: Patient, No Pcp Per  Admit date: 04/09/2023 Discharge date: 04/12/2023  Admitted From: [name] Disposition:  [name] Discharging physician: Lewie Chamber, MD Barriers to discharge: none  Recommendations at discharge: Continue chronic medical management  Discharge Condition: stable CODE STATUS: Full Diet recommendation:  Diet Orders (From admission, onward)     Start     Ordered   04/12/23 0000  Diet general        04/12/23 1203   04/10/23 1207  Diet regular Room service appropriate? Yes; Fluid consistency: Thin  Diet effective now       Question Answer Comment  Room service appropriate? Yes   Fluid consistency: Thin      04/10/23 1206            Hospital Course: [name] is a 77 yo female with PMH afib, bipolar d/o, cardiac arrest, HTN, morbid obesity, DMII, functional quadriplegia, chronic hypoxic and hypercarbic respiratory failure who lives at [facility] and was found unresponsive.  She was found to be hypercarbic on blood gas but not acidotic.  She was placed on BiPAP initially for work of breathing but continued to have progressive respiratory distress and required intubation. She was monitored in the ICU and able to be extubated on 04/10/2023.  Workup was also notable for COVID infection and questionable aspiration/pneumonia.  She was placed on empiric antibiotics for coverage.  Assessment and Plan: * Acute on chronic respiratory failure with hypoxia and hypercapnia (HCC) - in setting of covid 19 infection and possible underlying pna/aspiration - continue empiric abx and complete course; d/c with augmentin to complete - continue steroids; finish with decadron course  COVID-19 virus infection - continue steroids - patient started on empiric abx course due to concern for aspiration on admission as well   Encephalopathy acute-resolved as of 04/11/2023 - found  unresponsive and ultimately was intubated on admission; now s/p extubation on 8/28 - she is awake and alert today  Functional quadriplegia (HCC) - bedbound at baseline - resides at [facility]  DMII (diabetes mellitus, type 2) (HCC) - resume home regimen  Mixed diabetic hyperlipidemia associated with type 2 diabetes mellitus (HCC) - On Lipitor outpatient  Bipolar disorder (HCC) - resume depakote  Obesity (BMI 30-39.9) - Complicates overall prognosis and care - Body mass index is 34.34 kg/m.  AF (paroxysmal atrial fibrillation) (HCC) - Continue Eliquis and coreg    Principal Diagnosis: Acute on chronic respiratory failure with hypoxia and hypercapnia (HCC)  Discharge Diagnoses: Active Hospital Problems   Diagnosis Date Noted   Acute on chronic respiratory failure with hypoxia and hypercapnia (HCC) 04/09/2023    Priority: 1.   COVID-19 virus infection 04/10/2023    Priority: 1.   OSA treated with BiPAP    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   Mixed diabetic hyperlipidemia associated with type 2 diabetes mellitus (HCC) 11/17/2020   DMII (diabetes mellitus, type 2) (HCC) 11/17/2020   Bipolar disorder (HCC) 04/27/2020   AF (paroxysmal atrial fibrillation) (HCC) 12/03/2019   Obesity (BMI 30-39.9) 02/27/2018    Resolved Hospital Problems   Diagnosis Date Noted Date Resolved   Encephalopathy acute 11/18/2020 04/11/2023    Priority: 2.     Discharge Instructions     Diet general   Complete by: As directed    Increase activity slowly   Complete by: As directed    No wound care   Complete by: As directed  Allergies as of 04/12/2023       Reactions   Chlorhexidine Gluconate Itching   Vancomycin    Vancomycin infusion related reaction- May need Vanc to be given at a slower rate    Acetazolamide Er Rash        Medication List     TAKE these medications    amoxicillin-clavulanate 875-125 MG tablet Commonly known as: AUGMENTIN Take 1 tablet by  mouth 2 (two) times daily for 3 days.   apixaban 5 MG Tabs tablet Commonly known as: ELIQUIS Take 5 mg by mouth 2 (two) times daily.   atorvastatin 10 MG tablet Commonly known as: LIPITOR Take 10 mg by mouth at bedtime.   calcium carbonate 500 MG chewable tablet Commonly known as: TUMS - dosed in mg elemental calcium Chew 500 mg by mouth in the morning.   carvedilol 3.125 MG tablet Commonly known as: COREG Take 1 tablet (3.125 mg total) by mouth 2 (two) times daily with a meal.   clotrimazole 1 % cream Commonly known as: LOTRIMIN Apply 1 Application topically 2 (two) times daily. Inner upper thighs.   Combivent Respimat 20-100 MCG/ACT Aers respimat Generic drug: Ipratropium-Albuterol Inhale 1 puff into the lungs every 12 (twelve) hours.   dexamethasone 6 MG tablet Commonly known as: DECADRON Take 1 tablet (6 mg total) by mouth daily for 3 days.   divalproex 500 MG DR tablet Commonly known as: DEPAKOTE Take 500 mg by mouth 2 (two) times daily.   divalproex 125 MG DR tablet Commonly known as: DEPAKOTE Take 125 mg by mouth daily.   empagliflozin 10 MG Tabs tablet Commonly known as: JARDIANCE Take 10 mg by mouth in the morning.   ergocalciferol 1.25 MG (50000 UT) capsule Commonly known as: VITAMIN D2 Take 50,000 Units by mouth See admin instructions. 50,000 units once daily in the morning on Tuesday, Friday.   Eucerin Skin Calming Crea Apply 1 application  topically at bedtime. To arms and dry skin   LORazepam 0.5 MG tablet Commonly known as: ATIVAN Take 1 tablet (0.5 mg total) by mouth daily. Take 0.5mg  by mouth once a day in addition to twice daily as needed for agitation or anxiety.   metFORMIN 750 MG 24 hr tablet Commonly known as: GLUCOPHAGE-XR Take 750 mg by mouth in the morning and at bedtime.   nystatin powder Apply 1 Application topically 2 (two) times daily. Buttocks, peri groin, inner thighs   PSYLLIUM HUSK PO Take 0.4 g by mouth 2 (two) times  daily.   risperiDONE 2 MG tablet Commonly known as: RISPERDAL Take 2 mg by mouth in the morning.   risperiDONE 3 MG tablet Commonly known as: RISPERDAL Take 3 mg by mouth at bedtime.   spironolactone 25 MG tablet Commonly known as: ALDACTONE Take 12.5 mg by mouth in the morning.   tamsulosin 0.4 MG Caps capsule Commonly known as: FLOMAX Take 1 capsule (0.4 mg total) by mouth every other day.        Allergies  Allergen Reactions   Chlorhexidine Gluconate Itching   Vancomycin     Vancomycin infusion related reaction- May need Vanc to be given at a slower rate    Acetazolamide Er Rash    Consultations:   Procedures:   Discharge Exam: BP (!) 162/48   Pulse (!) 51   Temp 98.4 F (36.9 C)   Resp (!) 28   Ht 5\' 6"  (1.676 m)   Wt 96.7 kg   LMP  (LMP Unknown)  SpO2 100%   BMI 34.41 kg/m  Physical Exam Constitutional:      General: She is not in acute distress.    Appearance: She is obese.  HENT:     Head: Normocephalic and atraumatic.     Mouth/Throat:     Mouth: Mucous membranes are moist.  Eyes:     Extraocular Movements: Extraocular movements intact.  Cardiovascular:     Rate and Rhythm: Normal rate and regular rhythm.  Pulmonary:     Effort: Pulmonary effort is normal. No respiratory distress.     Breath sounds: No wheezing.  Abdominal:     General: Bowel sounds are normal. There is no distension.     Palpations: Abdomen is soft.     Tenderness: There is no abdominal tenderness.  Musculoskeletal:        General: Normal range of motion.     Cervical back: Normal range of motion and neck supple.  Skin:    General: Skin is warm and dry.  Neurological:     Mental Status: She is alert. Mental status is at baseline.  Psychiatric:        Mood and Affect: Mood normal.      The results of significant diagnostics from this hospitalization (including imaging, microbiology, ancillary and laboratory) are listed below for reference.    Microbiology: Recent Results (from the past 240 hour(s))  Culture, blood (Routine x 2)     Status: None (Preliminary result)   Collection Time: 04/09/23  1:55 PM   Specimen: BLOOD LEFT HAND  Result Value Ref Range Status   Specimen Description   Final    BLOOD LEFT HAND Performed at [facility], [address], [place], [place] [address]    Special Requests   Final    BOTTLES DRAWN AEROBIC AND ANAEROBIC Blood Culture results may not be optimal due to an excessive volume of blood received in culture bottles Performed at [facility], [address], [place], [place] [address]    Culture   Final    NO GROWTH 3 DAYS Performed at [facility], [address], [place], [place] [address]    Report Status PENDING  Incomplete  Culture, blood (Routine x 2)     Status: None (Preliminary result)   Collection Time: 04/09/23  2:04 PM   Specimen: BLOOD  Result Value Ref Range Status   Specimen Description   Final    BLOOD RIGHT ANTECUBITAL Performed at [facility], [address] [place], [place] [address]    Special Requests   Final    BOTTLES DRAWN AEROBIC AND ANAEROBIC Blood Culture adequate volume Performed at [facility], [address], [place], [place] [address]    Culture   Final    NO GROWTH 3 DAYS Performed at [facility], [address] [place], [place] [address]    Report Status PENDING  Incomplete  Resp panel by RT-PCR (RSV, Flu A&B, Covid) Anterior Nasal Swab     Status: Abnormal   Collection Time: 04/09/23  2:19 PM   Specimen: Anterior Nasal Swab  Result Value Ref Range Status   SARS Coronavirus 2 by RT PCR POSITIVE (A) NEGATIVE Final    Comment: (NOTE) SARS-CoV-2 target nucleic acids are DETECTED.  The SARS-CoV-2 RNA is generally detectable in upper respiratory specimens during the acute phase of infection. Positive results are indicative of the presence of the identified virus, but do  not rule out  bacterial infection or co-infection with other pathogens not detected by the test. Clinical correlation with patient history and other diagnostic information is necessary to determine patient infection status. The expected result is Negative.  Fact Sheet for Patients: BloggerCourse.com  Fact Sheet for Healthcare Providers: SeriousBroker.it  This test is not yet approved or cleared by the [place] FDA and  has been authorized for detection and/or diagnosis of SARS-CoV-2 by FDA under an Emergency Use Authorization (EUA).  This EUA will remain in effect (meaning this test can be used) for the duration of  the COVID-19 declaration under Section 564(b)(1) of the A ct, 21 U.S.C. section 360bbb-3(b)(1), unless the authorization is terminated or revoked sooner.     Influenza A by PCR NEGATIVE NEGATIVE Final   Influenza B by PCR NEGATIVE NEGATIVE Final    Comment: (NOTE) The Xpert Xpress SARS-CoV-2/FLU/RSV plus assay is intended as an aid in the diagnosis of influenza from Nasopharyngeal swab specimens and should not be used as a sole basis for treatment. Nasal washings and aspirates are unacceptable for Xpert Xpress SARS-CoV-2/FLU/RSV testing.  Fact Sheet for Patients: BloggerCourse.com  Fact Sheet for Healthcare Providers: SeriousBroker.it  This test is not yet approved or cleared by the [place] FDA and has been authorized for detection and/or diagnosis of SARS-CoV-2 by FDA under an Emergency Use Authorization (EUA). This EUA will remain in effect (meaning this test can be used) for the duration of the COVID-19 declaration under Section 564(b)(1) of the Act, 21 U.S.C. section 360bbb-3(b)(1), unless the authorization is terminated or revoked.     Resp Syncytial Virus by PCR NEGATIVE NEGATIVE Final    Comment: (NOTE) Fact Sheet for  Patients: BloggerCourse.com  Fact Sheet for Healthcare Providers: SeriousBroker.it  This test is not yet approved or cleared by the [place] FDA and has been authorized for detection and/or diagnosis of SARS-CoV-2 by FDA under an Emergency Use Authorization (EUA). This EUA will remain in effect (meaning this test can be used) for the duration of the COVID-19 declaration under Section 564(b)(1) of the Act, 21 U.S.C. section 360bbb-3(b)(1), unless the authorization is terminated or revoked.  Performed at [facility], [address] [place], [place] [address]   Respiratory (~20 pathogens) panel by PCR     Status: None   Collection Time: 04/09/23  2:19 PM   Specimen: Nasopharyngeal Swab; Respiratory  Result Value Ref Range Status   Adenovirus NOT DETECTED NOT DETECTED Final   Coronavirus 229E NOT DETECTED NOT DETECTED Final    Comment: (NOTE) The Coronavirus on the Respiratory Panel, DOES NOT test for the novel  Coronavirus (2019 nCoV)    Coronavirus HKU1 NOT DETECTED NOT DETECTED Final   Coronavirus NL63 NOT DETECTED NOT DETECTED Final   Coronavirus OC43 NOT DETECTED NOT DETECTED Final   Metapneumovirus NOT DETECTED NOT DETECTED Final   Rhinovirus / Enterovirus NOT DETECTED NOT DETECTED Final   Influenza A NOT DETECTED NOT DETECTED Final   Influenza B NOT DETECTED NOT DETECTED Final   Parainfluenza Virus 1 NOT DETECTED NOT DETECTED Final   Parainfluenza Virus 2 NOT DETECTED NOT DETECTED Final   Parainfluenza Virus 3 NOT DETECTED NOT DETECTED Final   Parainfluenza Virus 4 NOT DETECTED NOT DETECTED Final   Respiratory Syncytial Virus NOT DETECTED NOT DETECTED Final   Bordetella pertussis NOT DETECTED NOT DETECTED Final   Bordetella Parapertussis NOT DETECTED NOT DETECTED Final   Chlamydophila pneumoniae NOT DETECTED NOT DETECTED Final   Mycoplasma pneumoniae NOT DETECTED NOT DETECTED  Final    Comment:  Performed at [facility], [address], [place], [place] [address]  MRSA Next Gen by PCR, Nasal     Status: None   Collection Time: 04/09/23  5:04 PM   Specimen: Nasal Mucosa; Nasal Swab  Result Value Ref Range Status   MRSA by PCR Next Gen NOT DETECTED NOT DETECTED Final    Comment: (NOTE) The GeneXpert MRSA Assay (FDA approved for NASAL specimens only), is one component of a comprehensive MRSA colonization surveillance program. It is not intended to diagnose MRSA infection nor to guide or monitor treatment for MRSA infections. Test performance is not FDA approved in patients less than 51 years old. Performed at [facility], [address] [place], [place] [address]      Labs: BNP (last 3 results) Recent Labs    08/30/22 0743  BNP 111.3*   Basic Metabolic Panel: Recent Labs  Lab 04/09/23 1336 04/10/23 0306 04/11/23 0307 04/12/23 0702  NA 138 137 137 140  K 4.2 4.2 4.0 4.3  CL 94* 98 95* 100  CO2 31 22 30 31   GLUCOSE 171* 131* 151* 184*  BUN 16 19 22  31*  CREATININE 0.71 0.70 0.68 0.54  CALCIUM 8.9 8.6* 8.4* 8.6*  MG  --  1.9 2.4 2.6*   Liver Function Tests: Recent Labs  Lab 04/09/23 1336  AST 10*  ALT 12  ALKPHOS 31*  BILITOT 0.7  PROT 6.8  ALBUMIN 3.1*   No results for input(s): "LIPASE", "AMYLASE" in the last 168 hours. Recent Labs  Lab 04/09/23 1738  AMMONIA 18   CBC: Recent Labs  Lab 04/09/23 1336 04/10/23 0306 04/11/23 0307 04/12/23 0702  WBC 10.5 15.1* 10.8* 9.3  NEUTROABS 6.5  --   --  5.6  HGB 13.3 12.4 12.2 12.5  HCT 42.9 39.7 39.2 41.4  MCV 99.8 98.8 97.0 100.2*  PLT 144* 117* 126* 143*   Cardiac Enzymes: No results for input(s): "CKTOTAL", "CKMB", "CKMBINDEX", "TROPONINI" in the last 168 hours. BNP: Invalid input(s): "POCBNP" CBG: Recent Labs  Lab 04/11/23 0714 04/11/23 1243 04/11/23 1722 04/11/23 2128 04/12/23 0803  GLUCAP 122* 190* 217* 272* 155*   D-Dimer No results for input(s):  "DDIMER" in the last 72 hours. Hgb A1c Recent Labs    04/09/23 1747  HGBA1C 7.1*   Lipid Profile No results for input(s): "CHOL", "HDL", "LDLCALC", "TRIG", "CHOLHDL", "LDLDIRECT" in the last 72 hours. Thyroid function studies No results for input(s): "TSH", "T4TOTAL", "T3FREE", "THYROIDAB" in the last 72 hours.  Invalid input(s): "FREET3" Anemia work up No results for input(s): "VITAMINB12", "FOLATE", "FERRITIN", "TIBC", "IRON", "RETICCTPCT" in the last 72 hours. Urinalysis    Component Value Date/Time   COLORURINE YELLOW 04/09/2023 1534   APPEARANCEUR CLEAR 04/09/2023 1534   LABSPEC 1.036 (H) 04/09/2023 1534   PHURINE 5.0 04/09/2023 1534   GLUCOSEU >=500 (A) 04/09/2023 1534   HGBUR NEGATIVE 04/09/2023 1534   BILIRUBINUR NEGATIVE 04/09/2023 1534   KETONESUR 20 (A) 04/09/2023 1534   PROTEINUR NEGATIVE 04/09/2023 1534   NITRITE NEGATIVE 04/09/2023 1534   LEUKOCYTESUR NEGATIVE 04/09/2023 1534   Sepsis Labs Recent Labs  Lab 04/09/23 1336 04/10/23 0306 04/11/23 0307 04/12/23 0702  WBC 10.5 15.1* 10.8* 9.3   Microbiology Recent Results (from the past 240 hour(s))  Culture, blood (Routine x 2)     Status: None (Preliminary result)   Collection Time: 04/09/23  1:55 PM   Specimen: BLOOD LEFT HAND  Result Value Ref Range Status  Specimen Description   Final    BLOOD LEFT HAND Performed at [facility], [address], [place], [place] [address]    Special Requests   Final    BOTTLES DRAWN AEROBIC AND ANAEROBIC Blood Culture results may not be optimal due to an excessive volume of blood received in culture bottles Performed at [facility], [address] [place], [place] [address]    Culture   Final    NO GROWTH 3 DAYS Performed at [facility], [address] [place], [place] [address]    Report Status PENDING  Incomplete  Culture, blood (Routine x 2)     Status: None (Preliminary result)   Collection Time: 04/09/23  2:04 PM    Specimen: BLOOD  Result Value Ref Range Status   Specimen Description   Final    BLOOD RIGHT ANTECUBITAL Performed at [facility], [address], [place], [place] [address]    Special Requests   Final    BOTTLES DRAWN AEROBIC AND ANAEROBIC Blood Culture adequate volume Performed at [facility], [address] [place], [place] [address]    Culture   Final    NO GROWTH 3 DAYS Performed at [facility], [address] [place], [place] [address]    Report Status PENDING  Incomplete  Resp panel by RT-PCR (RSV, Flu A&B, Covid) Anterior Nasal Swab     Status: Abnormal   Collection Time: 04/09/23  2:19 PM   Specimen: Anterior Nasal Swab  Result Value Ref Range Status   SARS Coronavirus 2 by RT PCR POSITIVE (A) NEGATIVE Final    Comment: (NOTE) SARS-CoV-2 target nucleic acids are DETECTED.  The SARS-CoV-2 RNA is generally detectable in upper respiratory specimens during the acute phase of infection. Positive results are indicative of the presence of the identified virus, but do not rule out bacterial infection or co-infection with other pathogens not detected by the test. Clinical correlation with patient history and other diagnostic information is necessary to determine patient infection status. The expected result is Negative.  Fact Sheet for Patients: BloggerCourse.com  Fact Sheet for Healthcare Providers: SeriousBroker.it  This test is not yet approved or cleared by the [place] FDA and  has been authorized for detection and/or diagnosis of SARS-CoV-2 by FDA under an Emergency Use Authorization (EUA).  This EUA will remain in effect (meaning this test can be used) for the duration of  the COVID-19 declaration under Section 564(b)(1) of the A ct, 21 U.S.C. section 360bbb-3(b)(1), unless the authorization is terminated or revoked sooner.     Influenza A by PCR NEGATIVE  NEGATIVE Final   Influenza B by PCR NEGATIVE NEGATIVE Final    Comment: (NOTE) The Xpert Xpress SARS-CoV-2/FLU/RSV plus assay is intended as an aid in the diagnosis of influenza from Nasopharyngeal swab specimens and should not be used as a sole basis for treatment. Nasal washings and aspirates are unacceptable for Xpert Xpress SARS-CoV-2/FLU/RSV testing.  Fact Sheet for Patients: BloggerCourse.com  Fact Sheet for Healthcare Providers: SeriousBroker.it  This test is not yet approved or cleared by the [place] FDA and has been authorized for detection and/or diagnosis of SARS-CoV-2 by FDA under an Emergency Use Authorization (EUA). This EUA will remain in effect (meaning this test can be used) for the duration of the COVID-19 declaration under Section 564(b)(1) of the Act, 21 U.S.C. section 360bbb-3(b)(1), unless the authorization is terminated or revoked.     Resp  Syncytial Virus by PCR NEGATIVE NEGATIVE Final    Comment: (NOTE) Fact Sheet for Patients: BloggerCourse.com  Fact Sheet for Healthcare Providers: SeriousBroker.it  This test is not yet approved or cleared by the [place] FDA and has been authorized for detection and/or diagnosis of SARS-CoV-2 by FDA under an Emergency Use Authorization (EUA). This EUA will remain in effect (meaning this test can be used) for the duration of the COVID-19 declaration under Section 564(b)(1) of the Act, 21 U.S.C. section 360bbb-3(b)(1), unless the authorization is terminated or revoked.  Performed at [facility], [address] [place], [place] [address]   Respiratory (~20 pathogens) panel by PCR     Status: None   Collection Time: 04/09/23  2:19 PM   Specimen: Nasopharyngeal Swab; Respiratory  Result Value Ref Range Status   Adenovirus NOT DETECTED NOT DETECTED Final   Coronavirus 229E NOT DETECTED  NOT DETECTED Final    Comment: (NOTE) The Coronavirus on the Respiratory Panel, DOES NOT test for the novel  Coronavirus (2019 nCoV)    Coronavirus HKU1 NOT DETECTED NOT DETECTED Final   Coronavirus NL63 NOT DETECTED NOT DETECTED Final   Coronavirus OC43 NOT DETECTED NOT DETECTED Final   Metapneumovirus NOT DETECTED NOT DETECTED Final   Rhinovirus / Enterovirus NOT DETECTED NOT DETECTED Final   Influenza A NOT DETECTED NOT DETECTED Final   Influenza B NOT DETECTED NOT DETECTED Final   Parainfluenza Virus 1 NOT DETECTED NOT DETECTED Final   Parainfluenza Virus 2 NOT DETECTED NOT DETECTED Final   Parainfluenza Virus 3 NOT DETECTED NOT DETECTED Final   Parainfluenza Virus 4 NOT DETECTED NOT DETECTED Final   Respiratory Syncytial Virus NOT DETECTED NOT DETECTED Final   Bordetella pertussis NOT DETECTED NOT DETECTED Final   Bordetella Parapertussis NOT DETECTED NOT DETECTED Final   Chlamydophila pneumoniae NOT DETECTED NOT DETECTED Final   Mycoplasma pneumoniae NOT DETECTED NOT DETECTED Final    Comment: Performed at [facility], [address] [place], [place] [address]  MRSA Next Gen by PCR, Nasal     Status: None   Collection Time: 04/09/23  5:04 PM   Specimen: Nasal Mucosa; Nasal Swab  Result Value Ref Range Status   MRSA by PCR Next Gen NOT DETECTED NOT DETECTED Final    Comment: (NOTE) The GeneXpert MRSA Assay (FDA approved for NASAL specimens only), is one component of a comprehensive MRSA colonization surveillance program. It is not intended to diagnose MRSA infection nor to guide or monitor treatment for MRSA infections. Test performance is not FDA approved in patients less than 44 years old. Performed at [facility], [address], [place], [place] [address]     Procedures/Studies: CT CHEST WO CONTRAST  Result Date: 04/09/2023 CLINICAL DATA:  Pneumonia with complication suspected. X-ray done. Patient was found unresponsive. COVID positive.  Intubated. EXAM: CT CHEST WITHOUT CONTRAST TECHNIQUE: Multidetector CT imaging of the chest was performed following the standard protocol without IV contrast. RADIATION DOSE REDUCTION: This exam was performed according to the departmental dose-optimization program which includes automated exposure control, adjustment of the mA and/or kV according to patient size and/or use of iterative reconstruction technique. COMPARISON:  Chest radiograph 04/09/2023.  CT chest 11/17/2020 FINDINGS: Cardiovascular: Normal heart size. No pericardial effusions. Normal caliber thoracic aorta. Calcification of the aorta. Mediastinum/Nodes: An enteric tube is present with tip in the stomach. Esophagus is decompressed. Endotracheal tube with tip above the carina but just above the origin of the right mainstem  bronchus. No significant lymphadenopathy. Thyroid gland is unremarkable. Lungs/Pleura: Consolidation and volume loss in both lung bases, greater on the left. Air bronchograms. Changes could represent atelectasis, pneumonia, or aspiration. Mild secretions demonstrated in the tracheobronchial structures. No pleural effusions. No pneumothorax. Upper Abdomen: Cholelithiasis. No inflammatory changes seen in the gallbladder. Musculoskeletal: Degenerative changes in the spine. No acute bony abnormalities. IMPRESSION: 1. Consolidation or atelectasis in both lung bases, possibly pneumonia or aspiration. 2. Mild secretions demonstrated in the trachea and bronchi. 3. Aortic atherosclerosis. 4. Cholelithiasis. Electronically Signed   By: Burman Nieves M.D.   On: 04/09/2023 17:46   CT HEAD WO CONTRAST ( )  Result Date: 04/09/2023 CLINICAL DATA:  Mental status change, persistent or worsening. Found unresponsive. Coronavirus infection. Intubated. EXAM: CT HEAD WITHOUT CONTRAST TECHNIQUE: Contiguous axial images were obtained from the base of the skull through the vertex without intravenous contrast. RADIATION DOSE REDUCTION: This exam was  performed according to the departmental dose-optimization program which includes automated exposure control, adjustment of the mA and/or kV according to patient size and/or use of iterative reconstruction technique. COMPARISON:  08/30/2022 FINDINGS: Brain: Age related volume loss. No evidence of old or acute focal infarction, mass lesion, hemorrhage, hydrocephalus or extra-axial collection. Vascular: There is atherosclerotic calcification of the major vessels at the base of the brain. Skull: Negative Sinuses/Orbits: Mild mucosal disease of the sinuses. No advanced finding. Orbits negative. Other: None IMPRESSION: No acute or significant finding. Age related volume loss. Atherosclerotic calcification of the major vessels at the base of the brain. Electronically Signed   By: Paulina Fusi M.D.   On: 04/09/2023 17:23   DG Chest Port 1 View  Result Date: 04/09/2023 CLINICAL DATA:  Tube placement EXAM: PORTABLE CHEST 1 VIEW COMPARISON:  X-ray 04/09/2023 FINDINGS: ET tube seen with the tip 16 mm above the carina. Enteric tube with tip extending beneath the diaphragm. Slight elevation of the right hemidiaphragm. There is left retrocardiac opacity which is new from previous possible atelectasis. No pneumothorax or edema. Overlapping cardiac leads. IMPRESSION: New ET tube and enteric tube. Left retrocardiac opacity. Elevated right hemidiaphragm Electronically Signed   By: Karen Kays M.D.   On: 04/09/2023 16:15   DG Chest Portable 1 View  Result Date: 04/09/2023 CLINICAL DATA:  Sepsis EXAM: PORTABLE CHEST 1 VIEW COMPARISON:  09/01/2022 FINDINGS: Mild right lower lobe opacity, favoring atelectasis, pneumonia not excluded. Left lung is essentially clear. No pleural effusion or pneumothorax. The heart is normal in size. IMPRESSION: Mild right lower lobe opacity, favoring atelectasis, pneumonia not excluded. Electronically Signed   By: Charline Bills M.D.   On: 04/09/2023 15:17     Time coordinating discharge: Over  30 minutes    Lewie Chamber, MD  [facility] 04/12/2023, 12:07 PM

## 2023-04-12 NOTE — Progress Notes (Signed)
Pt very emotional. BP elevated. Asked pt if anxious, she replied "yes.". Gave PRN ativan

## 2023-04-12 NOTE — Plan of Care (Signed)
  Problem: Activity: Goal: Ability to tolerate increased activity will improve Outcome: Completed/Met   Problem: Respiratory: Goal: Ability to maintain a clear airway and adequate ventilation will improve Outcome: Completed/Met   Problem: Role Relationship: Goal: Method of communication will improve Outcome: Completed/Met   Problem: Education: Goal: Knowledge of General Education information will improve Description: Including pain rating scale, medication(s)/side effects and non-pharmacologic comfort measures Outcome: Completed/Met   Problem: Health Behavior/Discharge Planning: Goal: Ability to manage health-related needs will improve Outcome: Completed/Met   Problem: Clinical Measurements: Goal: Ability to maintain clinical measurements within normal limits will improve Outcome: Completed/Met Goal: Will remain free from infection Outcome: Completed/Met Goal: Diagnostic test results will improve Outcome: Completed/Met Goal: Respiratory complications will improve Outcome: Completed/Met Goal: Cardiovascular complication will be avoided Outcome: Completed/Met   Problem: Activity: Goal: Risk for activity intolerance will decrease Outcome: Completed/Met   Problem: Nutrition: Goal: Adequate nutrition will be maintained Outcome: Completed/Met   Problem: Coping: Goal: Level of anxiety will decrease Outcome: Completed/Met   Problem: Elimination: Goal: Will not experience complications related to bowel motility Outcome: Completed/Met Goal: Will not experience complications related to urinary retention Outcome: Completed/Met   Problem: Pain Managment: Goal: General experience of comfort will improve Outcome: Completed/Met   Problem: Safety: Goal: Ability to remain free from injury will improve Outcome: Completed/Met   Problem: Skin Integrity: Goal: Risk for impaired skin integrity will decrease Outcome: Completed/Met   Problem: Education: Goal: Ability to  describe self-care measures that may prevent or decrease complications (Diabetes Survival Skills Education) will improve Outcome: Completed/Met Goal: Individualized Educational Video(s) Outcome: Completed/Met   Problem: Coping: Goal: Ability to adjust to condition or change in health will improve Outcome: Completed/Met   Problem: Fluid Volume: Goal: Ability to maintain a balanced intake and output will improve Outcome: Completed/Met   Problem: Health Behavior/Discharge Planning: Goal: Ability to identify and utilize available resources and services will improve Outcome: Completed/Met Goal: Ability to manage health-related needs will improve Outcome: Completed/Met   Problem: Metabolic: Goal: Ability to maintain appropriate glucose levels will improve Outcome: Completed/Met   Problem: Nutritional: Goal: Maintenance of adequate nutrition will improve Outcome: Completed/Met Goal: Progress toward achieving an optimal weight will improve Outcome: Completed/Met   Problem: Skin Integrity: Goal: Risk for impaired skin integrity will decrease Outcome: Completed/Met   Problem: Tissue Perfusion: Goal: Adequacy of tissue perfusion will improve Outcome: Completed/Met

## 2023-04-14 LAB — CULTURE, BLOOD (ROUTINE X 2)
Culture: NO GROWTH
Culture: NO GROWTH
Special Requests: ADEQUATE

## 2023-07-18 ENCOUNTER — Encounter (HOSPITAL_COMMUNITY): Payer: Self-pay

## 2023-07-18 ENCOUNTER — Emergency Department (HOSPITAL_COMMUNITY)
Admission: EM | Admit: 2023-07-18 | Discharge: 2023-07-18 | Disposition: A | Discharge status: Home / Self Care | Payer: Medicare Other | Attending: Emergency Medicine | Admitting: Emergency Medicine

## 2023-07-18 ENCOUNTER — Emergency Department (HOSPITAL_COMMUNITY): Payer: Medicare Other

## 2023-07-18 ENCOUNTER — Other Ambulatory Visit: Payer: Self-pay

## 2023-07-18 DIAGNOSIS — R531 Weakness: Secondary | ICD-10-CM | POA: Insufficient documentation

## 2023-07-18 DIAGNOSIS — Z8674 Personal history of sudden cardiac arrest: Secondary | ICD-10-CM | POA: Diagnosis not present

## 2023-07-18 DIAGNOSIS — J9611 Chronic respiratory failure with hypoxia: Secondary | ICD-10-CM | POA: Diagnosis not present

## 2023-07-18 DIAGNOSIS — Z7901 Long term (current) use of anticoagulants: Secondary | ICD-10-CM | POA: Insufficient documentation

## 2023-07-18 DIAGNOSIS — Z7984 Long term (current) use of oral hypoglycemic drugs: Secondary | ICD-10-CM | POA: Insufficient documentation

## 2023-07-18 DIAGNOSIS — Z79899 Other long term (current) drug therapy: Secondary | ICD-10-CM | POA: Insufficient documentation

## 2023-07-18 DIAGNOSIS — R532 Functional quadriplegia: Secondary | ICD-10-CM | POA: Diagnosis not present

## 2023-07-18 DIAGNOSIS — E119 Type 2 diabetes mellitus without complications: Secondary | ICD-10-CM | POA: Diagnosis not present

## 2023-07-18 DIAGNOSIS — I119 Hypertensive heart disease without heart failure: Secondary | ICD-10-CM | POA: Insufficient documentation

## 2023-07-18 DIAGNOSIS — Z1152 Encounter for screening for COVID-19: Secondary | ICD-10-CM | POA: Insufficient documentation

## 2023-07-18 DIAGNOSIS — I10 Essential (primary) hypertension: Secondary | ICD-10-CM | POA: Insufficient documentation

## 2023-07-18 LAB — TROPONIN I (HIGH SENSITIVITY)
Troponin I (High Sensitivity): 7 ng/L (ref <18)
Troponin I (High Sensitivity): 8 ng/L (ref <18)

## 2023-07-18 LAB — I-STAT VENOUS BLOOD GAS, ED
Acid-Base Excess: 15 mmol/L — ABNORMAL HIGH (ref 0.0–2.0)
Bicarbonate: 35.7 mmol/L — ABNORMAL HIGH (ref 20.0–28.0)
Calcium, Ion: 0.82 mmol/L — CL (ref 1.15–1.40)
HCT: 38 % (ref 36.0–46.0)
Hemoglobin: 12.9 g/dL (ref 12.0–15.0)
O2 Saturation: 100 %
Potassium: >8.5 mmol/L (ref 3.5–5.1)
Sodium: 128 mmol/L — ABNORMAL LOW (ref 135–145)
TCO2: 37 mmol/L — ABNORMAL HIGH (ref 22–32)
pCO2, Ven: 31.7 mm[Hg] — ABNORMAL LOW (ref 44–60)
pH, Ven: 7.661 (ref 7.25–7.43)
pO2, Ven: 177 mm[Hg] — ABNORMAL HIGH (ref 32–45)

## 2023-07-18 LAB — CBC WITH DIFFERENTIAL/PLATELET
Abs Immature Granulocytes: 0.08 10*3/uL — ABNORMAL HIGH (ref 0.00–0.07)
Basophils Absolute: 0 10*3/uL (ref 0.0–0.1)
Basophils Relative: 1 %
Eosinophils Absolute: 0.2 10*3/uL (ref 0.0–0.5)
Eosinophils Relative: 2 %
HCT: 38.8 % (ref 36.0–46.0)
Hemoglobin: 11.8 g/dL — ABNORMAL LOW (ref 12.0–15.0)
Immature Granulocytes: 1 %
Lymphocytes Relative: 39 %
Lymphs Abs: 3.3 10*3/uL (ref 0.7–4.0)
MCH: 29.9 pg (ref 26.0–34.0)
MCHC: 30.4 g/dL (ref 30.0–36.0)
MCV: 98.5 fL (ref 80.0–100.0)
Monocytes Absolute: 0.9 10*3/uL (ref 0.1–1.0)
Monocytes Relative: 11 %
Neutro Abs: 4 10*3/uL (ref 1.7–7.7)
Neutrophils Relative %: 46 %
Platelets: 150 10*3/uL (ref 150–400)
RBC: 3.94 MIL/uL (ref 3.87–5.11)
RDW: 13.4 % (ref 11.5–15.5)
WBC: 8.6 10*3/uL (ref 4.0–10.5)
nRBC: 0 % (ref 0.0–0.2)

## 2023-07-18 LAB — URINALYSIS, W/ REFLEX TO CULTURE (INFECTION SUSPECTED)
Bacteria, UA: NONE SEEN
Bilirubin Urine: NEGATIVE
Glucose, UA: >=500 mg/dL — AB
Hgb urine dipstick: NEGATIVE
Ketones, ur: 5 mg/dL — AB
Leukocytes,Ua: NEGATIVE
Nitrite: NEGATIVE
Protein, ur: NEGATIVE mg/dL
Specific Gravity, Urine: 1.016 (ref 1.005–1.030)
pH: 5 (ref 5.0–8.0)

## 2023-07-18 LAB — COMPREHENSIVE METABOLIC PANEL
ALT: 13 U/L (ref 0–44)
AST: 14 U/L — ABNORMAL LOW (ref 15–41)
Albumin: 2.8 g/dL — ABNORMAL LOW (ref 3.5–5.0)
Alkaline Phosphatase: 31 U/L — ABNORMAL LOW (ref 38–126)
Anion gap: 12 (ref 5–15)
BUN: 17 mg/dL (ref 8–23)
CO2: 31 mmol/L (ref 22–32)
Calcium: 9.2 mg/dL (ref 8.9–10.3)
Chloride: 94 mmol/L — ABNORMAL LOW (ref 98–111)
Creatinine, Ser: 0.57 mg/dL (ref 0.44–1.00)
GFR, Estimated: >60 mL/min (ref >60)
Glucose, Bld: 88 mg/dL (ref 70–99)
Potassium: 4.5 mmol/L (ref 3.5–5.1)
Sodium: 137 mmol/L (ref 135–145)
Total Bilirubin: 0.8 mg/dL (ref <1.2)
Total Protein: 6.2 g/dL — ABNORMAL LOW (ref 6.5–8.1)

## 2023-07-18 LAB — RESP PANEL BY RT-PCR (RSV, FLU A&B, COVID)  RVPGX2
Influenza A by PCR: NEGATIVE
Influenza B by PCR: NEGATIVE
Resp Syncytial Virus by PCR: NEGATIVE
SARS Coronavirus 2 by RT PCR: NEGATIVE

## 2023-07-18 LAB — VALPROIC ACID LEVEL: Valproic Acid Lvl: 58 ug/mL (ref 50.0–100.0)

## 2023-07-18 NOTE — ED Triage Notes (Signed)
PT BIB EMS for weakness from [facility], per staff she was fine this morning and at 12:30 the patient said she felt weak, and they wanted her to be checked out.   Patient denies any concern other than feeling weak.  3L [place] at baseline.  138/60 CBG 103 HR 150's 100 % 3L

## 2023-07-18 NOTE — ED Notes (Signed)
Unsuccessful attempt to reach facility for report x1.

## 2023-07-18 NOTE — ED Provider Notes (Signed)
[place] EMERGENCY DEPARTMENT AT [facility] Provider Note   CSN: [id number] Arrival date & time: 07/18/23  1531     History  Chief Complaint  Patient presents with   Weakness    Becky Hendricks is a 77 y.o. female.  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  Staff reported that she was fine this morning and then around 1230 she complained of feeling weak.  They sent her to the emergency room to be evaluated.  Patient reports that right now she feels fine but felt that yes she did feel weak earlier but cannot quantify what that means further than just feeling weak.  She denies feeling short of breath having chest pain or abdominal pain.  She reports she has been eating and denies any nausea vomiting or diarrhea.  She has not had a cough or fever.  She denies a headache or being sleepy.  The history is provided by the patient and medical records.  Weakness      Home Medications Prior to Admission medications   Medication Sig Start Date End Date Taking? Authorizing Provider  apixaban (ELIQUIS) 5 MG TABS tablet Take 5 mg by mouth 2 (two) times daily.    [provider]  atorvastatin (LIPITOR) 10 MG tablet Take 10 mg by mouth at bedtime.    [provider]  calcium carbonate (TUMS - DOSED IN MG ELEMENTAL CALCIUM) 500 MG chewable tablet Chew 500 mg by mouth in the morning.    [provider]  carvedilol (COREG) 3.125 MG tablet Take 1 tablet (3.125 mg total) by mouth 2 (two) times daily with a meal. 09/08/22 04/09/23  Hughie Closs, MD  clotrimazole (LOTRIMIN) 1 % cream Apply 1 Application topically 2 (two) times daily. Inner upper thighs.    [provider]  divalproex (DEPAKOTE) 125 MG DR tablet Take 125 mg by mouth  daily.    [provider]  divalproex (DEPAKOTE) 500 MG DR tablet Take 500 mg by mouth 2 (two) times daily.    [provider]  Emollient (EUCERIN SKIN CALMING) CREA Apply 1 application  topically at bedtime. To arms and dry skin    [provider]  empagliflozin (JARDIANCE) 10 MG TABS tablet Take 10 mg by mouth in the morning.    [provider]  ergocalciferol (VITAMIN D2) 1.25 MG (50000 UT) capsule Take 50,000 Units by mouth See admin instructions. 50,000 units once daily in the morning on Tuesday, Friday.    [provider]  Ipratropium-Albuterol (COMBIVENT RESPIMAT) 20-100 MCG/ACT AERS respimat Inhale 1 puff into the lungs every 12 (twelve) hours.    [provider]  LORazepam (ATIVAN) 0.5 MG tablet Take 1 tablet (0.5 mg total) by mouth daily. Take 0.5mg  by mouth once a day in addition to twice daily as needed for agitation or anxiety. 04/12/23   Lewie Chamber, MD  metFORMIN (GLUCOPHAGE-XR) 750 MG 24 hr tablet Take 750 mg by mouth in the morning and at bedtime.    [provider]  nystatin powder Apply 1 Application topically 2 (two) times daily. Buttocks, peri groin, inner thighs    [provider]  PSYLLIUM HUSK PO Take 0.4 g by mouth 2 (two) times daily.    [provider]  risperiDONE (RISPERDAL) 2  MG tablet Take 2 mg by mouth in the morning.    [provider]  risperiDONE (RISPERDAL) 3 MG tablet Take 3 mg by mouth at bedtime. 11/09/21   [provider]  spironolactone (ALDACTONE) 25 MG tablet Take 12.5 mg by mouth in the morning. 11/27/21   [provider]  tamsulosin (FLOMAX) 0.4 MG CAPS capsule Take 1 capsule (0.4 mg total) by mouth every other day. 04/20/21   Pennie Banter, DO      Allergies    Chlorhexidine gluconate, Vancomycin, and Acetazolamide er    Review of Systems   Review of Systems  Neurological:  Positive for weakness.    Physical Exam Updated Vital Signs BP  (!) 164/84   Pulse 70   Temp 98.1 F (36.7 C) (Oral)   Resp (!) 21   LMP  (LMP Unknown)   SpO2 100%  Physical Exam Vitals and nursing note reviewed.  Constitutional:      General: She is not in acute distress.    Appearance: She is well-developed.     Comments: Chronically ill-appearing  HENT:     Head: Normocephalic and atraumatic.     Mouth/Throat:     Mouth: Mucous membranes are moist.  Eyes:     Pupils: Pupils are equal, round, and reactive to light.  Cardiovascular:     Rate and Rhythm: Normal rate and regular rhythm.     Heart sounds: Normal heart sounds. No murmur heard.    No friction rub.  Pulmonary:     Effort: Pulmonary effort is normal.     Breath sounds: Normal breath sounds. Decreased air movement present. No wheezing or rales.  Abdominal:     General: Bowel sounds are normal. There is no distension.     Palpations: Abdomen is soft.     Tenderness: There is no abdominal tenderness. There is no guarding or rebound.  Musculoskeletal:        General: No tenderness. Normal range of motion.     Right lower leg: No edema.     Left lower leg: No edema.     Comments: No edema  Skin:    General: Skin is warm and dry.     Findings: No rash.  Neurological:     Mental Status: She is alert and oriented to person, place, and time.     Comments: All 4 extremities with contractures  Psychiatric:        Behavior: Behavior normal.     ED Results / Procedures / Treatments   Labs (all labs ordered are listed, but only abnormal results are displayed) Labs Reviewed  CBC WITH DIFFERENTIAL/PLATELET - Abnormal; Notable for the following components:      Result Value   Hemoglobin 11.8 (*)    Abs Immature Granulocytes 0.08 (*)    All other components within normal limits  COMPREHENSIVE METABOLIC PANEL - Abnormal; Notable for the following components:   Chloride 94 (*)    Total Protein 6.2 (*)    Albumin 2.8 (*)    AST 14 (*)    Alkaline Phosphatase 31 (*)    All other  components within normal limits  URINALYSIS, W/ REFLEX TO CULTURE (INFECTION SUSPECTED) - Abnormal; Notable for the following components:   Glucose, UA >=500 (*)    Ketones, ur 5 (*)    All other components within normal limits  I-STAT VENOUS BLOOD GAS, ED - Abnormal; Notable for the following components:   pH, Ven 7.661 (*)  pCO2, Ven 31.7 (*)    pO2, Ven 177 (*)    Bicarbonate 35.7 (*)    TCO2 37 (*)    Acid-Base Excess 15.0 (*)    Sodium 128 (*)    Potassium >8.5 (*)    Calcium, Ion 0.82 (*)    All other components within normal limits  RESP PANEL BY RT-PCR (RSV, FLU A&B, COVID)  [id number]  VALPROIC ACID LEVEL  TROPONIN I (HIGH SENSITIVITY)  TROPONIN I (HIGH SENSITIVITY)    EKG EKG Interpretation Date/Time:  Thursday July 18 2023 16:39:50 EST Ventricular Rate:  69 PR Interval:    QRS Duration:  87 QT Interval:  376 QTC Calculation: 403 R Axis:   32  Text Interpretation: Sinus rhythm Borderline T wave abnormalities No significant change since last tracing Artifact Confirmed by Gwyneth Sprout ([id number]) on 07/18/2023 4:51:09 PM  Radiology DG Chest Port 1 View  Result Date: 07/18/2023 CLINICAL DATA:  Weakness and confusion EXAM: PORTABLE CHEST - 1 VIEW COMPARISON:  04/09/2023 FINDINGS: Mild apparent cardiomegaly likely due to expiratory phase of imaging. Interval removal of enteric and endotracheal tubes. Interval development of mild bandlike opacity in the right perihilar lung likely due to atelectasis. Lungs otherwise clear. Lungs are clear. IMPRESSION: Mild right midlung atelectasis. Otherwise no significant abnormality of the chest. Electronically Signed   By: Acquanetta Belling M.D.   On: 07/18/2023 17:28    Procedures Procedures    Medications Ordered in ED Medications - No data to display  ED Course/ Medical Decision Making/ A&P                                 Medical Decision Making Amount and/or Complexity of Data Reviewed External Data Reviewed: notes. Labs:  ordered. Decision-making details documented in ED Course. Radiology: ordered and independent interpretation performed. Decision-making details documented in ED Course. ECG/medicine tests: ordered and independent interpretation performed. Decision-making details documented in ED Course.   Pt with multiple medical problems and comorbidities and presenting today with a complaint that caries a high risk for morbidity and mortality.  Here today with feeling weak earlier today.  Patient cannot give further details about what that means.  Her facility wanted her to be evaluated because of this but reports otherwise she was fine today.  Patient is a functional quadriplegic and cannot lift extremities well.  However she is mentating normally able to give her history and answer questions appropriately.  She denies all review of systems.  Breath sounds are somewhat decreased but may be related to body habitus and positioning.  Will do evaluation to a normal electrolytes no evidence of renal failure or infection as patient has a history of chronic respiratory failure with recurrent pneumonias and hypercarbic respiratory failure.  Will also rule out dysrhythmia or acute cardiac cause of her feeling weak.  Blood sugar here was >100.  7:53 PM I independently interpreted patient's labs and EKG.  EKG without acute findings.  Troponin x 2 is negative, UA without signs of infection, VBG with pH of 7.6, CO2 of 31, bicarb of 35, potassium of greater than 8.5.  Feel the sample has hemolyzed and is not accurate due to a normal CMP with normal creatinine and potassium levels and CBC without acute findings.  Valproic acid level is also normal and COVID and flu are negative.I have independently visualized and interpreted pt's images today. Chest x-ray without acute findings.  At this time patient  has remained stable.  Vital signs are normal.  Feel that she can be discharged back to her facility.        Final Clinical  Impression(s) / ED Diagnoses Final diagnoses:  Weakness    Rx / DC Orders ED Discharge Orders     None         Gwyneth Sprout, MD 07/18/23 [phone]

## 2023-07-18 NOTE — ED Notes (Signed)
20g IV removed from pts left posterior wrist at time of discharge. Site dressed with gauze and tape. Site is clean and dry.

## 2023-07-18 NOTE — ED Notes (Signed)
Patient has screamed continuously for staff, even while we are present in the room, while we are in the room she states she really doesn't have a true need, but doesn't want to be alone.

## 2023-07-18 NOTE — Discharge Instructions (Addendum)
Imaging, blood work, urine, EKG are all reassuring today.  No signs of infection or heart issues.

## 2023-07-18 NOTE — ED Notes (Signed)
[facility] called no ETA

## 2023-07-19 ENCOUNTER — Emergency Department (HOSPITAL_COMMUNITY)
Admission: EM | Admit: 2023-07-19 | Discharge: 2023-07-19 | Disposition: A | Discharge status: Home / Self Care | Payer: Medicare Other | Attending: Emergency Medicine | Admitting: Emergency Medicine

## 2023-07-19 ENCOUNTER — Encounter (HOSPITAL_COMMUNITY): Payer: Self-pay | Admitting: Emergency Medicine

## 2023-07-19 ENCOUNTER — Other Ambulatory Visit: Payer: Self-pay

## 2023-07-19 DIAGNOSIS — I4891 Unspecified atrial fibrillation: Secondary | ICD-10-CM | POA: Diagnosis not present

## 2023-07-19 DIAGNOSIS — I10 Essential (primary) hypertension: Secondary | ICD-10-CM | POA: Insufficient documentation

## 2023-07-19 DIAGNOSIS — Z7901 Long term (current) use of anticoagulants: Secondary | ICD-10-CM | POA: Insufficient documentation

## 2023-07-19 DIAGNOSIS — Z7984 Long term (current) use of oral hypoglycemic drugs: Secondary | ICD-10-CM | POA: Insufficient documentation

## 2023-07-19 DIAGNOSIS — Z7401 Bed confinement status: Secondary | ICD-10-CM | POA: Diagnosis present

## 2023-07-19 NOTE — Discharge Instructions (Signed)
Continue your current medications.  Return to the ED at any time if you change your mind about having further evaluation.

## 2023-07-19 NOTE — ED Notes (Signed)
Pt report given to [facility] staff zandra,

## 2023-07-19 NOTE — ED Provider Notes (Signed)
[place] EMERGENCY DEPARTMENT AT [facility] Provider Note   CSN: [id number] Arrival date & time: 07/19/23  2019     History  Chief Complaint  Patient presents with   Medical Clearance    Becky Hendricks is a 77 y.o. female.  HPI   Patient has a history of atrial fibrillation bipolar disorder hypertension diabetes osteomyelitis, cardiac arrest, pneumonia.  Patient is chronically on 3 L of oxygen.  Patient resides at [facility] nurse facility.  Yesterday afternoon patient reportedly started feeling weak.  She was sent to the ED for further evaluation.  Patient stated in the ED yesterday that she was feeling fine although she did feel little bit weak earlier.  She was not having any trouble with chest pain or shortness of breath.  No abdominal pain.  No vomiting or diarrhea.  She was not having a headache.  She denies any new numbness or weakness.  Patient at baseline is weak.  She is not able to ambulate and is bedridden.  Patient states today the staff was concerned and thought she was not acting right.  They called EMS to bring the patient to the ED.  Patient does not think that she needed to come to the ED.  She states she does not want to be evaluated and would like to go back to her facility.  She says she was here yesterday and had an evaluation.  She is not having any trouble with chest pain.  She denies fevers.  She is not having abdominal pain.  Patient states she feels like she is at her usual baseline  Home Medications Prior to Admission medications   Medication Sig Start Date End Date Taking? Authorizing Provider  apixaban (ELIQUIS) 5 MG TABS tablet Take 5 mg by mouth 2 (two) times daily.    [provider]  atorvastatin (LIPITOR) 10 MG tablet Take 10 mg by mouth at bedtime.    [provider]  calcium carbonate (TUMS - DOSED IN MG ELEMENTAL CALCIUM) 500 MG chewable tablet Chew 500 mg by mouth in the morning.    [provider]   carvedilol (COREG) 3.125 MG tablet Take 1 tablet (3.125 mg total) by mouth 2 (two) times daily with a meal. 09/08/22 04/09/23  Hughie Closs, MD  clotrimazole (LOTRIMIN) 1 % cream Apply 1 Application topically 2 (two) times daily. Inner upper thighs.    [provider]  divalproex (DEPAKOTE) 125 MG DR tablet Take 125 mg by mouth daily.    [provider]  divalproex (DEPAKOTE) 500 MG DR tablet Take 500 mg by mouth 2 (two) times daily.    [provider]  Emollient (EUCERIN SKIN CALMING) CREA Apply 1 application  topically at bedtime. To arms and dry skin    [provider]  empagliflozin (JARDIANCE) 10 MG TABS tablet Take 10 mg by mouth in the morning.    [provider]  ergocalciferol (VITAMIN D2) 1.25 MG (50000 UT) capsule Take 50,000 Units by mouth See admin instructions. 50,000 units once daily in the morning on Tuesday, Friday.    [provider]  Ipratropium-Albuterol (COMBIVENT RESPIMAT) 20-100 MCG/ACT AERS respimat Inhale 1 puff into the lungs every 12 (twelve) hours.    [provider]  LORazepam (ATIVAN) 0.5 MG tablet Take 1 tablet (0.5 mg total) by mouth daily. Take 0.5mg  by mouth once a day in addition to twice daily as needed for agitation or anxiety. 04/12/23   Lewie Chamber, MD  metFORMIN [place] Roche)  750 MG 24 hr tablet Take 750 mg by mouth in the morning and at bedtime.    [provider]  nystatin powder Apply 1 Application topically 2 (two) times daily. Buttocks, peri groin, inner thighs    [provider]  PSYLLIUM HUSK PO Take 0.4 g by mouth 2 (two) times daily.    [provider]  risperiDONE (RISPERDAL) 2 MG tablet Take 2 mg by mouth in the morning.    [provider]  risperiDONE (RISPERDAL) 3 MG tablet Take 3 mg by mouth at bedtime. 11/09/21   [provider]  spironolactone (ALDACTONE) 25 MG tablet Take 12.5 mg by mouth in the morning. 11/27/21   [provider]  tamsulosin (FLOMAX) 0.4 MG CAPS capsule Take 1 capsule (0.4 mg total) by mouth every other day. 04/20/21   Pennie Banter, DO      Allergies    Chlorhexidine gluconate, Vancomycin, and Acetazolamide er    Review of Systems   Review of Systems  Physical Exam Updated Vital Signs BP (!) 153/67 (BP Location: Right Arm)   Pulse 65   Temp 97.7 F (36.5 C) (Oral)   Resp 20   LMP  (LMP Unknown)   SpO2 99%  Physical Exam Vitals and nursing note reviewed.  Constitutional:      Appearance: She is well-developed. She is not diaphoretic.  HENT:     Head: Normocephalic and atraumatic.     Right Ear: External ear normal.     Left Ear: External ear normal.  Eyes:     General: No scleral icterus.       Right eye: No discharge.        Left eye: No discharge.     Conjunctiva/sclera: Conjunctivae normal.  Neck:     Trachea: No tracheal deviation.  Cardiovascular:     Rate and Rhythm: Normal rate and regular rhythm.  Pulmonary:     Effort: Pulmonary effort is normal. No respiratory distress.     Breath sounds: Normal breath sounds. No stridor. No wheezing or rales.  Abdominal:     General: Bowel sounds are normal. There is no distension.     Palpations: Abdomen is soft.     Tenderness: There is no abdominal tenderness. There is no guarding or rebound.  Musculoskeletal:        General: No tenderness or deformity.     Cervical back: Neck supple.  Skin:    General: Skin is warm and dry.     Findings: No rash.  Neurological:     General: No focal deficit present.     Mental Status: She is alert.     Cranial Nerves: No cranial nerve deficit, dysarthria or facial asymmetry.     Sensory: No sensory deficit.     Motor: Weakness and tremor present. No abnormal muscle tone or seizure activity.     Coordination: Coordination normal.  Psychiatric:        Mood and Affect: Mood normal.     ED Results / Procedures / Treatments   Labs (all labs ordered are listed, but only abnormal  results are displayed) Labs Reviewed - No data to display  EKG None  Radiology DG Chest [facility] 1 View  Result Date: 07/18/2023 CLINICAL DATA:  Weakness and confusion EXAM: PORTABLE CHEST - 1 VIEW COMPARISON:  04/09/2023 FINDINGS: Mild apparent cardiomegaly likely due to expiratory phase of imaging. Interval removal of enteric and endotracheal tubes. Interval development of mild bandlike opacity in the  right perihilar lung likely due to atelectasis. Lungs otherwise clear. Lungs are clear. IMPRESSION: Mild right midlung atelectasis. Otherwise no significant abnormality of the chest. Electronically Signed   By: Acquanetta Belling M.D.   On: 07/18/2023 17:28    Procedures Procedures    Medications Ordered in ED Medications - No data to display  ED Course/ Medical Decision Making/ A&P                                 Medical Decision Making Problems Addressed: Bedbound: chronic illness or injury   According to the initial EMS report the patient was lethargic at the nursing facility.  Patient denies feeling ill or lethargic.  Here in the ED she is alert and oriented to person place and time.  Patient is able to clearly tell me about the evaluation she had yesterday.  She denies any complaints of pain.  She denies any infectious symptoms.  Explained to the patient that I could do testing here in the ED to reevaluate for hospital causes of lethargy.  Patient states she does not want any testing done and would like to go back to her nursing facility.  Patient is alert and answering questions appropriately.  She appears to have capacity to not have any testing done if she chooses to.        Final Clinical Impression(s) / ED Diagnoses Final diagnoses:  Bedbound    Rx / DC Orders ED Discharge Orders     None         Linwood Dibbles, MD 07/19/23 2108

## 2023-07-19 NOTE — ED Triage Notes (Signed)
Pt BIBA from [facility] facility, nursing staff reported to EMS that pt experienced lethargic episode at 1800, needed sternal rub to arouse. Staff reports she is not at baseline. Pt A&Ox3.

## 2023-07-19 NOTE — ED Notes (Signed)
PTAR transport setup for pt

## 2023-09-12 ENCOUNTER — Inpatient Hospital Stay (HOSPITAL_COMMUNITY)
Admission: EM | Admit: 2023-09-12 | Discharge: 2023-09-24 | DRG: 004 | Disposition: A | Discharge status: Other Acute Inpatient Hospital | Payer: Medicare Other | Source: Skilled Nursing Facility | Attending: Pulmonary Disease | Admitting: Pulmonary Disease

## 2023-09-12 ENCOUNTER — Emergency Department (HOSPITAL_COMMUNITY): Payer: Medicare Other

## 2023-09-12 ENCOUNTER — Encounter (HOSPITAL_COMMUNITY): Payer: Self-pay

## 2023-09-12 DIAGNOSIS — F05 Delirium due to known physiological condition: Secondary | ICD-10-CM | POA: Diagnosis not present

## 2023-09-12 DIAGNOSIS — Z8701 Personal history of pneumonia (recurrent): Secondary | ICD-10-CM

## 2023-09-12 DIAGNOSIS — E785 Hyperlipidemia, unspecified: Secondary | ICD-10-CM | POA: Diagnosis present

## 2023-09-12 DIAGNOSIS — R627 Adult failure to thrive: Secondary | ICD-10-CM | POA: Diagnosis present

## 2023-09-12 DIAGNOSIS — I110 Hypertensive heart disease with heart failure: Secondary | ICD-10-CM | POA: Diagnosis present

## 2023-09-12 DIAGNOSIS — R569 Unspecified convulsions: Secondary | ICD-10-CM | POA: Diagnosis not present

## 2023-09-12 DIAGNOSIS — E119 Type 2 diabetes mellitus without complications: Secondary | ICD-10-CM | POA: Diagnosis not present

## 2023-09-12 DIAGNOSIS — J189 Pneumonia, unspecified organism: Secondary | ICD-10-CM | POA: Diagnosis not present

## 2023-09-12 DIAGNOSIS — J9621 Acute and chronic respiratory failure with hypoxia: Principal | ICD-10-CM | POA: Diagnosis present

## 2023-09-12 DIAGNOSIS — B974 Respiratory syncytial virus as the cause of diseases classified elsewhere: Secondary | ICD-10-CM | POA: Diagnosis present

## 2023-09-12 DIAGNOSIS — E1165 Type 2 diabetes mellitus with hyperglycemia: Secondary | ICD-10-CM | POA: Diagnosis not present

## 2023-09-12 DIAGNOSIS — Y95 Nosocomial condition: Secondary | ICD-10-CM | POA: Diagnosis present

## 2023-09-12 DIAGNOSIS — R0603 Acute respiratory distress: Secondary | ICD-10-CM | POA: Diagnosis not present

## 2023-09-12 DIAGNOSIS — R532 Functional quadriplegia: Secondary | ICD-10-CM | POA: Diagnosis present

## 2023-09-12 DIAGNOSIS — Z6831 Body mass index (BMI) 31.0-31.9, adult: Secondary | ICD-10-CM

## 2023-09-12 DIAGNOSIS — R001 Bradycardia, unspecified: Secondary | ICD-10-CM | POA: Diagnosis not present

## 2023-09-12 DIAGNOSIS — E662 Morbid (severe) obesity with alveolar hypoventilation: Secondary | ICD-10-CM | POA: Diagnosis present

## 2023-09-12 DIAGNOSIS — Z8674 Personal history of sudden cardiac arrest: Secondary | ICD-10-CM

## 2023-09-12 DIAGNOSIS — L89153 Pressure ulcer of sacral region, stage 3: Secondary | ICD-10-CM | POA: Diagnosis present

## 2023-09-12 DIAGNOSIS — K5900 Constipation, unspecified: Secondary | ICD-10-CM | POA: Diagnosis not present

## 2023-09-12 DIAGNOSIS — Z7901 Long term (current) use of anticoagulants: Secondary | ICD-10-CM

## 2023-09-12 DIAGNOSIS — J9601 Acute respiratory failure with hypoxia: Secondary | ICD-10-CM | POA: Diagnosis not present

## 2023-09-12 DIAGNOSIS — F319 Bipolar disorder, unspecified: Secondary | ICD-10-CM | POA: Diagnosis present

## 2023-09-12 DIAGNOSIS — G9341 Metabolic encephalopathy: Secondary | ICD-10-CM | POA: Diagnosis present

## 2023-09-12 DIAGNOSIS — J151 Pneumonia due to Pseudomonas: Secondary | ICD-10-CM | POA: Diagnosis not present

## 2023-09-12 DIAGNOSIS — Z993 Dependence on wheelchair: Secondary | ICD-10-CM

## 2023-09-12 DIAGNOSIS — I5032 Chronic diastolic (congestive) heart failure: Secondary | ICD-10-CM | POA: Diagnosis present

## 2023-09-12 DIAGNOSIS — Z930 Tracheostomy status: Secondary | ICD-10-CM | POA: Diagnosis not present

## 2023-09-12 DIAGNOSIS — I480 Paroxysmal atrial fibrillation: Secondary | ICD-10-CM | POA: Diagnosis present

## 2023-09-12 DIAGNOSIS — I10 Essential (primary) hypertension: Secondary | ICD-10-CM | POA: Diagnosis not present

## 2023-09-12 DIAGNOSIS — J988 Other specified respiratory disorders: Secondary | ICD-10-CM | POA: Diagnosis present

## 2023-09-12 DIAGNOSIS — R6520 Severe sepsis without septic shock: Secondary | ICD-10-CM | POA: Diagnosis not present

## 2023-09-12 DIAGNOSIS — Z881 Allergy status to other antibiotic agents status: Secondary | ICD-10-CM

## 2023-09-12 DIAGNOSIS — R1310 Dysphagia, unspecified: Secondary | ICD-10-CM | POA: Diagnosis present

## 2023-09-12 DIAGNOSIS — Z888 Allergy status to other drugs, medicaments and biological substances status: Secondary | ICD-10-CM

## 2023-09-12 DIAGNOSIS — Z8616 Personal history of COVID-19: Secondary | ICD-10-CM

## 2023-09-12 DIAGNOSIS — J1282 Pneumonia due to coronavirus disease 2019: Secondary | ICD-10-CM | POA: Diagnosis not present

## 2023-09-12 DIAGNOSIS — Z79899 Other long term (current) drug therapy: Secondary | ICD-10-CM

## 2023-09-12 DIAGNOSIS — I482 Chronic atrial fibrillation, unspecified: Secondary | ICD-10-CM | POA: Diagnosis not present

## 2023-09-12 DIAGNOSIS — R579 Shock, unspecified: Secondary | ICD-10-CM | POA: Diagnosis not present

## 2023-09-12 DIAGNOSIS — B964 Proteus (mirabilis) (morganii) as the cause of diseases classified elsewhere: Secondary | ICD-10-CM | POA: Diagnosis present

## 2023-09-12 DIAGNOSIS — G9340 Encephalopathy, unspecified: Secondary | ICD-10-CM | POA: Diagnosis not present

## 2023-09-12 DIAGNOSIS — J9622 Acute and chronic respiratory failure with hypercapnia: Secondary | ICD-10-CM | POA: Diagnosis present

## 2023-09-12 DIAGNOSIS — Z7984 Long term (current) use of oral hypoglycemic drugs: Secondary | ICD-10-CM

## 2023-09-12 DIAGNOSIS — R4182 Altered mental status, unspecified: Secondary | ICD-10-CM | POA: Diagnosis not present

## 2023-09-12 DIAGNOSIS — Z883 Allergy status to other anti-infective agents status: Secondary | ICD-10-CM

## 2023-09-12 DIAGNOSIS — Z9981 Dependence on supplemental oxygen: Secondary | ICD-10-CM

## 2023-09-12 LAB — I-STAT VENOUS BLOOD GAS, ED
Acid-Base Excess: 8 mmol/L — ABNORMAL HIGH (ref 0.0–2.0)
Bicarbonate: 35.9 mmol/L — ABNORMAL HIGH (ref 20.0–28.0)
Calcium, Ion: 1.14 mmol/L — ABNORMAL LOW (ref 1.15–1.40)
HCT: 42 % (ref 36.0–46.0)
Hemoglobin: 14.3 g/dL (ref 12.0–15.0)
O2 Saturation: 93 %
Potassium: 4.7 mmol/L (ref 3.5–5.1)
Sodium: 139 mmol/L (ref 135–145)
TCO2: 38 mmol/L — ABNORMAL HIGH (ref 22–32)
pCO2, Ven: 65.9 mm[Hg] — ABNORMAL HIGH (ref 44–60)
pH, Ven: 7.344 (ref 7.25–7.43)
pO2, Ven: 72 mm[Hg] — ABNORMAL HIGH (ref 32–45)

## 2023-09-12 LAB — I-STAT ARTERIAL BLOOD GAS, ED
Acid-Base Excess: 6 mmol/L — ABNORMAL HIGH (ref 0.0–2.0)
Bicarbonate: 32.2 mmol/L — ABNORMAL HIGH (ref 20.0–28.0)
Calcium, Ion: 1.2 mmol/L (ref 1.15–1.40)
HCT: 37 % (ref 36.0–46.0)
Hemoglobin: 12.6 g/dL (ref 12.0–15.0)
O2 Saturation: 100 %
Patient temperature: 36.5
Potassium: 4.3 mmol/L (ref 3.5–5.1)
Sodium: 140 mmol/L (ref 135–145)
TCO2: 34 mmol/L — ABNORMAL HIGH (ref 22–32)
pCO2 arterial: 53.6 mm[Hg] — ABNORMAL HIGH (ref 32–48)
pH, Arterial: 7.384 (ref 7.35–7.45)
pO2, Arterial: 293 mm[Hg] — ABNORMAL HIGH (ref 83–108)

## 2023-09-12 LAB — URINALYSIS, W/ REFLEX TO CULTURE (INFECTION SUSPECTED)
Bilirubin Urine: NEGATIVE
Glucose, UA: >=500 mg/dL — AB
Hgb urine dipstick: NEGATIVE
Ketones, ur: 20 mg/dL — AB
Leukocytes,Ua: NEGATIVE
Nitrite: NEGATIVE
Protein, ur: NEGATIVE mg/dL
Specific Gravity, Urine: 1.031 — ABNORMAL HIGH (ref 1.005–1.030)
pH: 6 (ref 5.0–8.0)

## 2023-09-12 LAB — COMPREHENSIVE METABOLIC PANEL
ALT: 6 U/L (ref 0–44)
AST: 10 U/L — ABNORMAL LOW (ref 15–41)
Albumin: 2.9 g/dL — ABNORMAL LOW (ref 3.5–5.0)
Alkaline Phosphatase: 33 U/L — ABNORMAL LOW (ref 38–126)
Anion gap: 11 (ref 5–15)
BUN: 16 mg/dL (ref 8–23)
CO2: 32 mmol/L (ref 22–32)
Calcium: 9.2 mg/dL (ref 8.9–10.3)
Chloride: 98 mmol/L (ref 98–111)
Creatinine, Ser: 0.63 mg/dL (ref 0.44–1.00)
GFR, Estimated: >60 mL/min (ref >60)
Glucose, Bld: 142 mg/dL — ABNORMAL HIGH (ref 70–99)
Potassium: 4.8 mmol/L (ref 3.5–5.1)
Sodium: 141 mmol/L (ref 135–145)
Total Bilirubin: 0.8 mg/dL (ref 0.0–1.2)
Total Protein: 6.4 g/dL — ABNORMAL LOW (ref 6.5–8.1)

## 2023-09-12 LAB — I-STAT CHEM 8, ED
BUN: 20 mg/dL (ref 8–23)
Calcium, Ion: 1.13 mmol/L — ABNORMAL LOW (ref 1.15–1.40)
Chloride: 98 mmol/L (ref 98–111)
Creatinine, Ser: 0.7 mg/dL (ref 0.44–1.00)
Glucose, Bld: 142 mg/dL — ABNORMAL HIGH (ref 70–99)
HCT: 43 % (ref 36.0–46.0)
Hemoglobin: 14.6 g/dL (ref 12.0–15.0)
Potassium: 4.7 mmol/L (ref 3.5–5.1)
Sodium: 139 mmol/L (ref 135–145)
TCO2: 34 mmol/L — ABNORMAL HIGH (ref 22–32)

## 2023-09-12 LAB — CBC
HCT: 43.6 % (ref 36.0–46.0)
Hemoglobin: 13.5 g/dL (ref 12.0–15.0)
MCH: 30.8 pg (ref 26.0–34.0)
MCHC: 31 g/dL (ref 30.0–36.0)
MCV: 99.5 fL (ref 80.0–100.0)
Platelets: 145 10*3/uL — ABNORMAL LOW (ref 150–400)
RBC: 4.38 MIL/uL (ref 3.87–5.11)
RDW: 13.5 % (ref 11.5–15.5)
WBC: 11.3 10*3/uL — ABNORMAL HIGH (ref 4.0–10.5)
nRBC: 0.4 % — ABNORMAL HIGH (ref 0.0–0.2)

## 2023-09-12 LAB — GLUCOSE, CAPILLARY
Glucose-Capillary: 107 mg/dL — ABNORMAL HIGH (ref 70–99)
Glucose-Capillary: 135 mg/dL — ABNORMAL HIGH (ref 70–99)
Glucose-Capillary: 141 mg/dL — ABNORMAL HIGH (ref 70–99)

## 2023-09-12 LAB — VALPROIC ACID LEVEL: Valproic Acid Lvl: 74 ug/mL (ref 50.0–100.0)

## 2023-09-12 LAB — I-STAT CG4 LACTIC ACID, ED: Lactic Acid, Venous: 0.8 mmol/L (ref 0.5–1.9)

## 2023-09-12 LAB — MRSA NEXT GEN BY PCR, NASAL: MRSA by PCR Next Gen: NOT DETECTED

## 2023-09-12 LAB — RESP PANEL BY RT-PCR (RSV, FLU A&B, COVID)  RVPGX2
Influenza A by PCR: NEGATIVE
Influenza B by PCR: NEGATIVE
Resp Syncytial Virus by PCR: POSITIVE — AB
SARS Coronavirus 2 by RT PCR: NEGATIVE

## 2023-09-12 LAB — PROCALCITONIN: Procalcitonin: <0.1 ng/mL

## 2023-09-12 MED ORDER — PHENYLEPHRINE HCL-NACL 20-0.9 MG/250ML-% IV SOLN: 25.0000 ug/min | INTRAVENOUS | Status: DC

## 2023-09-12 MED ORDER — ORAL CARE MOUTH RINSE: 15.0000 mL | OROMUCOSAL | Status: DC | PRN

## 2023-09-12 MED ORDER — SODIUM CHLORIDE 0.9 % IV SOLN
250.0000 mL | INTRAVENOUS | Status: AC
  Administered 2023-09-13: 250 mL via INTRAVENOUS

## 2023-09-12 MED ORDER — SODIUM CHLORIDE 0.9 % IV BOLUS
1000.0000 mL | Freq: Once | INTRAVENOUS | Status: AC
  Administered 2023-09-12: 1000 mL via INTRAVENOUS

## 2023-09-12 MED ORDER — PANTOPRAZOLE SODIUM 40 MG IV SOLR
40.0000 mg | Freq: Every day | INTRAVENOUS | Status: DC
  Administered 2023-09-12 – 2023-09-13 (×2): 40 mg via INTRAVENOUS
  Filled 2023-09-12 (×2): qty 10

## 2023-09-12 MED ORDER — ORAL CARE MOUTH RINSE
15.0000 mL | OROMUCOSAL | Status: DC
  Administered 2023-09-12 – 2023-09-14 (×16): 15 mL via OROMUCOSAL

## 2023-09-12 MED ORDER — HEPARIN SODIUM (PORCINE) 5000 UNIT/ML IJ SOLN: 5000.0000 [IU] | Freq: Three times a day (TID) | INTRAMUSCULAR | Status: DC

## 2023-09-12 MED ORDER — FENTANYL BOLUS VIA INFUSION: 25.0000 ug | INTRAVENOUS | Status: DC | PRN

## 2023-09-12 MED ORDER — LACTATED RINGERS IV BOLUS
500.0000 mL | Freq: Once | INTRAVENOUS | Status: AC
  Administered 2023-09-12: 500 mL via INTRAVENOUS

## 2023-09-12 MED ORDER — MIDAZOLAM HCL 2 MG/2ML IJ SOLN: 1.0000 mg | INTRAMUSCULAR | Status: DC | PRN

## 2023-09-12 MED ORDER — HEPARIN (PORCINE) 25000 UT/250ML-% IV SOLN
1400.0000 [IU]/h | INTRAVENOUS | Status: DC
  Administered 2023-09-12: 1200 [IU]/h via INTRAVENOUS
  Administered 2023-09-13 – 2023-09-14 (×2): 1400 [IU]/h via INTRAVENOUS
  Filled 2023-09-12 (×3): qty 250

## 2023-09-12 MED ORDER — DOCUSATE SODIUM 100 MG PO CAPS: 100.0000 mg | ORAL_CAPSULE | Freq: Two times a day (BID) | ORAL | Status: DC | PRN

## 2023-09-12 MED ORDER — PHENYLEPHRINE HCL-NACL 20-0.9 MG/250ML-% IV SOLN
INTRAVENOUS | Status: AC
  Filled 2023-09-12: qty 250

## 2023-09-12 MED ORDER — FENTANYL 2500MCG IN NS 250ML (10MCG/ML) PREMIX INFUSION
0.0000 ug/h | INTRAVENOUS | Status: DC
  Administered 2023-09-12: 100 ug/h via INTRAVENOUS
  Filled 2023-09-12: qty 250

## 2023-09-12 MED ORDER — SODIUM CHLORIDE 0.9 % IV SOLN: 250.0000 mL | INTRAVENOUS | Status: AC

## 2023-09-12 MED ORDER — ETOMIDATE 2 MG/ML IV SOLN
20.0000 mg | Freq: Once | INTRAVENOUS | Status: AC
  Administered 2023-09-12: 20 mg via INTRAVENOUS

## 2023-09-12 MED ORDER — VALPROIC ACID 250 MG/5ML PO SOLN
250.0000 mg | Freq: Four times a day (QID) | ORAL | Status: DC
  Administered 2023-09-12 – 2023-09-13 (×4): 250 mg
  Filled 2023-09-12 (×4): qty 5

## 2023-09-12 MED ORDER — NOREPINEPHRINE 4 MG/250ML-% IV SOLN
2.0000 ug/min | INTRAVENOUS | Status: DC
  Administered 2023-09-12: 2 ug/min via INTRAVENOUS

## 2023-09-12 MED ORDER — PROPOFOL 1000 MG/100ML IV EMUL
0.0000 ug/kg/min | INTRAVENOUS | Status: DC
  Administered 2023-09-12: 5 ug/kg/min via INTRAVENOUS
  Filled 2023-09-12: qty 100

## 2023-09-12 MED ORDER — ATORVASTATIN CALCIUM 10 MG PO TABS
10.0000 mg | ORAL_TABLET | Freq: Every day | ORAL | Status: DC
  Administered 2023-09-12: 10 mg
  Filled 2023-09-12: qty 1

## 2023-09-12 MED ORDER — POLYETHYLENE GLYCOL 3350 17 G PO PACK
17.0000 g | PACK | Freq: Every day | ORAL | Status: DC
  Administered 2023-09-12 – 2023-09-13 (×2): 17 g
  Filled 2023-09-12 (×2): qty 1

## 2023-09-12 MED ORDER — IPRATROPIUM-ALBUTEROL 0.5-2.5 (3) MG/3ML IN SOLN: 3.0000 mL | Freq: Four times a day (QID) | RESPIRATORY_TRACT | Status: DC | PRN

## 2023-09-12 MED ORDER — ROCURONIUM BROMIDE 10 MG/ML (PF) SYRINGE
120.0000 mg | PREFILLED_SYRINGE | Freq: Once | INTRAVENOUS | Status: AC
  Administered 2023-09-12: 120 mg via INTRAVENOUS

## 2023-09-12 MED ORDER — POLYETHYLENE GLYCOL 3350 17 G PO PACK: 17.0000 g | PACK | Freq: Every day | ORAL | Status: DC | PRN

## 2023-09-12 MED ORDER — INSULIN ASPART 100 UNIT/ML IJ SOLN
0.0000 [IU] | INTRAMUSCULAR | Status: DC
  Administered 2023-09-12 – 2023-09-13 (×3): 1 [IU] via SUBCUTANEOUS
  Administered 2023-09-14 (×2): 2 [IU] via SUBCUTANEOUS
  Administered 2023-09-14 (×2): 1 [IU] via SUBCUTANEOUS
  Administered 2023-09-15: 2 [IU] via SUBCUTANEOUS
  Administered 2023-09-15: 1 [IU] via SUBCUTANEOUS
  Administered 2023-09-15: 2 [IU] via SUBCUTANEOUS
  Administered 2023-09-15: 1 [IU] via SUBCUTANEOUS
  Administered 2023-09-16 (×2): 2 [IU] via SUBCUTANEOUS
  Administered 2023-09-16 (×2): 3 [IU] via SUBCUTANEOUS
  Administered 2023-09-16: 2 [IU] via SUBCUTANEOUS
  Administered 2023-09-16: 3 [IU] via SUBCUTANEOUS
  Administered 2023-09-16 – 2023-09-18 (×9): 2 [IU] via SUBCUTANEOUS

## 2023-09-12 MED ORDER — DOCUSATE SODIUM 50 MG/5ML PO LIQD
100.0000 mg | Freq: Two times a day (BID) | ORAL | Status: DC
  Administered 2023-09-12 – 2023-09-13 (×2): 100 mg
  Filled 2023-09-12 (×2): qty 10

## 2023-09-12 MED ORDER — MICONAZOLE NITRATE POWD
1.0000 | Freq: Two times a day (BID) | Status: DC | PRN
  Administered 2023-09-16 (×2): 1 via TOPICAL

## 2023-09-12 MED ORDER — NOREPINEPHRINE 4 MG/250ML-% IV SOLN
INTRAVENOUS | Status: AC
  Administered 2023-09-12: 10 ug/min via INTRAVENOUS
  Filled 2023-09-12: qty 250

## 2023-09-12 NOTE — ED Provider Notes (Cosign Needed)
[place] EMERGENCY DEPARTMENT AT [facility] Provider Note   CSN: [id number] Arrival date & time: 09/12/23  1556     History  Chief Complaint  Patient presents with   Unresponsive     Becky Hendricks is a 78 y.o. female with medical history of acute encephalopathy, bipolar disorder, C. difficile diarrhea, acute on chronic respiratory viral hypoxia and hypercapnia, atrial fibrillation, type 2 diabetes, paraplegic, multiple admissions to respiratory failure.  Patient presents to ED as a level 1 unresponsive.  Per EMS, the patient was found to be unresponsive in her wheelchair facility.  Patient was hypoxic when EMS arrived with 75% oxygen saturation room air.  Patient was brought in by EMS on nonrebreather.  The patient was immediately intubated.  Workup initiated.  HPI     Home Medications Prior to Admission medications   Medication Sig Start Date End Date Taking? Authorizing Provider  Amino Acids-Protein Hydrolys (PRO-STAT AWC) LIQD Take 30 mLs by mouth 2 (two) times daily between meals.   Yes [provider]  apixaban (ELIQUIS) 5 MG TABS tablet Take 5 mg by mouth 2 (two) times daily.   Yes [provider]  atorvastatin (LIPITOR) 10 MG tablet Take 10 mg by mouth at bedtime.   Yes [provider]  calcium carbonate (TUMS - DOSED IN MG ELEMENTAL CALCIUM) 500 MG chewable tablet Chew 500 mg by mouth daily.   Yes [provider]  carvedilol (COREG) 3.125 MG tablet Take 1 tablet (3.125 mg total) by mouth 2 (two) times daily with a meal. 09/08/22 10/19/23 Yes Pahwani, Daleen Bo, MD  divalproex (DEPAKOTE) 125 MG DR tablet Take 125 mg by mouth daily at 2 PM.   Yes [provider]  divalproex (DEPAKOTE) 500 MG DR tablet Take 500 mg by mouth 2 (two) times daily.   Yes [provider]  Emollient (EUCERIN SKIN CALMING) CREA Apply 1 application  topically at bedtime. To arms and dry skin   Yes [provider]  empagliflozin  (JARDIANCE) 10 MG TABS tablet Take 10 mg by mouth daily.   Yes [provider]  ergocalciferol (VITAMIN D2) 1.25 MG (50000 UT) capsule Take 50,000 Units by mouth See admin instructions. 50,000 units once daily in the morning on Tuesday, Friday.   Yes [provider]  Ipratropium-Albuterol (COMBIVENT RESPIMAT) 20-100 MCG/ACT AERS respimat Inhale 1 puff into the lungs every 12 (twelve) hours.   Yes [provider]  LORazepam (ATIVAN) 0.5 MG tablet Take 1 tablet (0.5 mg total) by mouth daily. Take 0.5mg  by mouth once a day in addition to twice daily as needed for agitation or anxiety. Patient taking differently: Take 0.5 mg by mouth daily. 04/12/23  Yes Lewie Chamber, MD  metFORMIN (GLUCOPHAGE-XR) 750 MG 24 hr tablet Take 750 mg by mouth 2 (two) times daily with a meal.   Yes [provider]  methocarbamol (ROBAXIN) 500 MG tablet Take 500 mg by mouth 2 (two) times daily.   Yes [provider]  miconazole nitrate (MICATIN) POWD Apply 1 Application topically 2 (two) times daily between meals. Wash and dry peri/groin, inner thighs and buttocks twice daily for yeast then dust with powder.   Yes [provider]  OXYGEN Inhale 1-5 L/min into the lungs continuous. May titrate to keep sats > 92%   Yes [provider]  PSYLLIUM HUSK PO Take 0.4 g by mouth 2 (two) times daily.   Yes [provider]  risperiDONE (RISPERDAL) 2 MG tablet Take 2 mg  by mouth daily.   Yes [provider]  risperiDONE (RISPERDAL) 3 MG tablet Take 3 mg by mouth at bedtime. 11/09/21  Yes [provider]  spironolactone (ALDACTONE) 25 MG tablet Take 12.5 mg by mouth daily. 11/27/21  Yes [provider]  tamsulosin (FLOMAX) 0.4 MG CAPS capsule Take 1 capsule (0.4 mg total) by mouth every other day. 04/20/21  Yes Pennie Banter, DO      Allergies    Firvanq [vancomycin], Hibiclens [chlorhexidine gluconate], and Diamox [acetazolamide]     Review of Systems   Review of Systems  Unable to perform ROS: Patient unresponsive (Level 5 caveat)    Physical Exam Updated Vital Signs BP (!) 89/55   Pulse 60   Temp 98 F (36.7 C) (Axillary)   Resp 16   Ht 5\' 6"  (1.676 m)   Wt 88.1 kg   LMP  (LMP Unknown)   SpO2 100%   BMI 31.35 kg/m  Physical Exam Vitals and nursing note reviewed.  Constitutional:      General: She is in acute distress.     Appearance: She is ill-appearing and toxic-appearing.  HENT:     Head: Normocephalic and atraumatic.     Mouth/Throat:     Mouth: Mucous membranes are dry.     Comments: Multiple missing teeth, dry mucous membranes Cardiovascular:     Rate and Rhythm: Normal rate and regular rhythm.  Pulmonary:     Effort: Respiratory distress present.  Abdominal:     General: Abdomen is flat.  Skin:    General: Skin is warm and dry.     ED Results / Procedures / Treatments   Labs (all labs ordered are listed, but only abnormal results are displayed) Labs Reviewed  RESP PANEL BY RT-PCR (RSV, FLU A&B, COVID)  RVPGX2 - Abnormal; Notable for the following components:      Result Value   Resp Syncytial Virus by PCR POSITIVE (*)    All other components within normal limits  COMPREHENSIVE METABOLIC PANEL - Abnormal; Notable for the following components:   Glucose, Bld 142 (*)    Total Protein 6.4 (*)    Albumin 2.9 (*)    AST 10 (*)    Alkaline Phosphatase 33 (*)    All other components within normal limits  CBC - Abnormal; Notable for the following components:   WBC 11.3 (*)    Platelets 145 (*)    nRBC 0.4 (*)    All other components within normal limits  URINALYSIS, W/ REFLEX TO CULTURE (INFECTION SUSPECTED) - Abnormal; Notable for the following components:   Specific Gravity, Urine 1.031 (*)    Glucose, UA >=500 (*)    Ketones, ur 20 (*)    Bacteria, UA RARE (*)    All other components within normal limits  GLUCOSE, CAPILLARY - Abnormal; Notable for the following components:    Glucose-Capillary 141 (*)    All other components within normal limits  GLUCOSE, CAPILLARY - Abnormal; Notable for the following components:   Glucose-Capillary 135 (*)    All other components within normal limits  I-STAT VENOUS BLOOD GAS, ED - Abnormal; Notable for the following components:   pCO2, Ven 65.9 (*)    pO2, Ven 72 (*)    Bicarbonate 35.9 (*)    TCO2 38 (*)    Acid-Base Excess 8.0 (*)    Calcium, Ion 1.14 (*)    All other components within normal limits  I-STAT CHEM 8, ED - Abnormal; Notable  for the following components:   Glucose, Bld 142 (*)    Calcium, Ion 1.13 (*)    TCO2 34 (*)    All other components within normal limits  I-STAT ARTERIAL BLOOD GAS, ED - Abnormal; Notable for the following components:   pCO2 arterial 53.6 (*)    pO2, Arterial 293 (*)    Bicarbonate 32.2 (*)    TCO2 34 (*)    Acid-Base Excess 6.0 (*)    All other components within normal limits  CULTURE, BLOOD (ROUTINE X 2)  CULTURE, BLOOD (ROUTINE X 2)  CULTURE, RESPIRATORY W GRAM STAIN  MRSA NEXT GEN BY PCR, NASAL  VALPROIC ACID LEVEL  PROCALCITONIN  BLOOD GAS, ARTERIAL  CBC  BASIC METABOLIC PANEL  MAGNESIUM  PHOSPHORUS  TRIGLYCERIDES  APTT  HEPARIN LEVEL (UNFRACTIONATED)  CBG MONITORING, ED  I-STAT CG4 LACTIC ACID, ED  I-STAT CG4 LACTIC ACID, ED    EKG EKG Interpretation Date/Time:  Thursday September 12 2023 16:35:19 EST Ventricular Rate:  86 PR Interval:  156 QRS Duration:  80 QT Interval:  388 QTC Calculation: 465 R Axis:   62  Text Interpretation: Sinus rhythm Nonspecific T abnormalities, lateral leads No significant change since last tracing Confirmed by Richardean Canal [phone]) on 09/12/2023 4:51:12 PM  Radiology CT HEAD WO CONTRAST ( ) Result Date: 09/12/2023 CLINICAL DATA:  Mental status change, unknown cause EXAM: CT HEAD WITHOUT CONTRAST TECHNIQUE: Contiguous axial images were obtained from the base of the skull through the vertex without intravenous contrast.  RADIATION DOSE REDUCTION: This exam was performed according to the departmental dose-optimization program which includes automated exposure control, adjustment of the mA and/or kV according to patient size and/or use of iterative reconstruction technique. COMPARISON:  CT head 04/09/2023 FINDINGS: Brain: Cerebral ventricle sizes are concordant with the degree of cerebral volume loss. Patchy and confluent areas of decreased attenuation are noted throughout the deep and periventricular white matter of the cerebral hemispheres bilaterally, compatible with chronic microvascular ischemic disease. No evidence of large-territorial acute infarction. No parenchymal hemorrhage. No mass lesion. No extra-axial collection. No mass effect or midline shift. No hydrocephalus. Basilar cisterns are patent. Vascular: No hyperdense vessel. Skull: No acute fracture or focal lesion. Sinuses/Orbits: Paranasal sinuses and mastoid air cells are clear. The orbits are unremarkable. Other: Enteric and endotracheal tubes partially visualized. IMPRESSION: No acute intracranial abnormality. Electronically Signed   By: Tish Frederickson M.D.   On: 09/12/2023 17:10   DG Abd Portable 1V Result Date: 09/12/2023 CLINICAL DATA:  [id number] Encounter for feeding tube placement [id number] EXAM: PORTABLE ABDOMEN - 1 VIEW COMPARISON:  None Available. FINDINGS: Enteric tube courses below the hemidiaphragm with tip and side port overlying the expected region of the gastric lumen. The bowel gas pattern is normal. No radio-opaque calculi or other significant radiographic abnormality are seen. IMPRESSION: 1. Enteric tube in good position. 2. Nonobstructive bowel gas pattern. Electronically Signed   By: Tish Frederickson M.D.   On: 09/12/2023 17:07   DG Chest Portable 1 View Result Date: 09/12/2023 CLINICAL DATA:  post intubation EXAM: PORTABLE CHEST 1 VIEW COMPARISON:  Chest x-ray 07/18/2023, CT chest 04/09/2023 FINDINGS: Endotracheal tube with tip terminating 4 cm  above the carina. Enteric tube coursing caudally with tip and side port collimated off view. The heart and mediastinal contours are unchanged Low lung volumes with no focal consolidation. No pulmonary edema. Left costophrenic angle collimated off view. No pleural effusion. No pneumothorax. No acute osseous abnormality. IMPRESSION: 1. Endotracheal tube in good position.  2. Enteric tube coursing caudally with tip and side port collimated off view. 3. Low lung volumes with no active disease. 4. Left costophrenic angle collimated off view. Electronically Signed   By: Tish Frederickson M.D.   On: 09/12/2023 17:07    Procedures .Critical Care  Performed by: Al Decant, PA-C Authorized by: Al Decant, PA-C   Critical care provider statement:    Critical care time (minutes):  60   Critical care time was exclusive of:  Separately billable procedures and treating other patients and teaching time   Critical care was necessary to treat or prevent imminent or life-threatening deterioration of the following conditions:  Respiratory failure and CNS failure or compromise   Critical care was time spent personally by me on the following activities:  Blood draw for specimens, development of treatment plan with patient or surrogate, discussions with consultants, discussions with primary provider, evaluation of patient's response to treatment, examination of patient, interpretation of cardiac output measurements, obtaining history from patient or surrogate, ordering and performing treatments and interventions, ordering and review of laboratory studies, ordering and review of radiographic studies, pulse oximetry, re-evaluation of patient's condition and review of old charts   I assumed direction of critical care for this patient from another provider in my specialty: no     Care discussed with: admitting provider     INTUBATION Performed by: Al Decant  Required items: required blood  products, implants, devices, and special equipment available Patient identity confirmed: provided demographic data and hospital-assigned identification number Time out: Immediately prior to procedure a "time out" was called to verify the correct patient, procedure, equipment, support staff and site/side marked as required.  Indications: Respiratory failure  Intubation method: Glidescope Laryngoscopy   Preoxygenation: BVM  Sedatives: Etomidate Paralytic: Succinylcholine  Tube Size:  cuffed  Post-procedure assessment: chest rise and ETCO2 monitor Breath sounds: equal and absent over the epigastrium Tube secured with: ETT holder Chest x-ray interpreted by radiologist and me.  Chest x-ray findings: endotracheal tube in appropriate position  Patient tolerated the procedure well with no immediate complications.    Medications Ordered in ED Medications  fentaNYL in NS (69mcg/ml) infusion-PREMIX (100 mcg/hr Intravenous Rate/Dose Change 09/12/23 1736)  fentaNYL (SUBLIMAZE) bolus via infusion 25-100 mcg (has no administration in time range)  midazolam (VERSED) injection 1-2 mg (has no administration in time range)  pantoprazole (PROTONIX) injection 40 mg (has no administration in time range)  insulin aspart (novoLOG) injection 0-9 Units (1 Units Subcutaneous Given 09/12/23 1845)  docusate (COLACE) 50 MG/5ML liquid 100 mg (has no administration in time range)  polyethylene glycol (MIRALAX / GLYCOLAX) packet 17 g (17 g Per Tube Given 09/12/23 1846)  propofol (DIPRIVAN) 1000 MG/100ML infusion (0 mcg/kg/min  100 kg Intravenous Stopped 09/12/23 1923)  ipratropium-albuterol (DUONEB) 0.5-2.5 (3) MG/3ML nebulizer solution 3 mL (has no administration in time range)  atorvastatin (LIPITOR) tablet 10 mg (has no administration in time range)  miconazole nitrate (MICATIN) topical powder 1 Application (has no administration in time range)  Oral care mouth rinse (15 mLs Mouth Rinse Given  09/12/23 2038)  Oral care mouth rinse (has no administration in time range)  valproic acid (DEPAKENE) 250 MG/5ML solution 250 mg (250 mg Per Tube Given 09/12/23 1846)  heparin ADULT infusion 100 units/mL (25000 units/263mL) (1,200 Units/hr Intravenous New Bag/Given 09/12/23 2032)  0.9 %  sodium chloride infusion (has no administration in time range)  norepinephrine (LEVOPHED) 4mg  in (0.016 mg/mL) premix infusion (2 mcg/min  Intravenous New Bag/Given 09/12/23 1946)  0.9 %  sodium chloride infusion (has no administration in time range)  phenylephrine (NEO-SYNEPHRINE) 20mg /NS premix infusion ( Intravenous Not Given 09/12/23 2043)  etomidate (AMIDATE) injection 20 mg (20 mg Intravenous Given 09/12/23 1605)  rocuronium (ZEMURON) injection 120 mg (120 mg Intravenous Given 09/12/23 1606)  sodium chloride 0.9 % bolus 1,000 mL (0 mLs Intravenous Stopped 09/12/23 1735)  lactated ringers bolus 500 mL (500 mLs Intravenous Bolus from Bag 09/12/23 1945)  phenylephrine (NEOSYNEPHRINE) 20-0.9 MG/250ML-% infusion (  Duplicate 09/12/23 2043)    ED Course/ Medical Decision Making/ A&P Clinical Course as of 09/12/23 2102  Thu Sep 12, 2023  1703 History of multiple admissions for respiratory failure from COVID initially and multiple admissions afterward for aspiration pneumonia [CG]    Clinical Course User Index [CG] Al Decant, PA-C   Medical Decision Making Amount and/or Complexity of Data Reviewed Labs: ordered. Radiology: ordered.  Risk Prescription drug management.   78 year old female presents to ED for evaluation of shortness rest.  Please see HPI for further details.  Patient came in with obvious respiratory distress, hypoxic in the mid 70s.  The patient was being bagged at this time.  Patient was transitioned onto examination table and oxygenated with BVM.  Patient was intubated at this time without difficulty.  Chest x-ray collected to ensure tube placement.  Patient also had NG  tube placed at this time.  Will collect extensive labs to include CBC, CMP, i-STAT Chem-8, i-STAT VBG, lactic acid, urinalysis, valproic acid level as patient has history of seizures, viral panel, blood cultures.  Will collect imaging to include chest x-ray, CT head.  Patient CBC with leukocytosis 11.3, no anemia.  Metabolic panel shows glucose 142 but no electrolyte derangement or elevated LFTs.  The patient i-STAT VBG shows pCO2 65.9, bicarb 35.9.  Patient viral panel positive for RSV.  Urinalysis shows rare bacteria and ketones.  Valproic acid level WNL at 74.  I-STAT ABG shows pCO2 arterial 53.6, bicarb 32.2.  CT head unremarkable.  Chest x-ray shows satisfactory tube placement, no consolidations or effusions.  Abdominal x-ray shows satisfactory NG tube placement.  Patient discussed with Tessie Fass, NP of critical care.  Has agreed to admit patient.   Final Clinical Impression(s) / ED Diagnoses Final diagnoses:  Acute on chronic respiratory failure with hypoxia [facility])    Rx / DC Orders ED Discharge Orders     None         Al Decant, Cordelia Poche 09/12/23 2103

## 2023-09-12 NOTE — ED Triage Notes (Signed)
Pt was found to be unresponsive in her wheel chair at her facility. Pt was hypoxic when they got to her with 75% SpO2

## 2023-09-12 NOTE — Progress Notes (Signed)
PHARMACY - ANTICOAGULATION CONSULT NOTE  Pharmacy Consult for Heparin Indication: atrial fibrillation  Allergies  Allergen Reactions   Firvanq [Vancomycin] Other (See Comments)    Vancomycin infusion related reaction- May need Vanc to be given at a slower rate    Hibiclens [Chlorhexidine Gluconate] Itching   Diamox [Acetazolamide] Rash    Patient Measurements: Height: 5\' 6"  (167.6 cm) Weight: 100 kg (220 lb 7.4 oz) (estimation based on previous chart wt) IBW/kg (Calculated) : 59.3 Heparin Dosing Weight: 81.9 kg  Vital Signs: Temp: 99.1 F (37.3 C) (01/30 1723) Temp Source: Rectal (01/30 1723) BP: 89/55 (01/30 1740) Pulse Rate: 60 (01/30 1743)  Labs: Recent Labs    09/12/23 1601 09/12/23 1609 09/12/23 1704  HGB 13.5 14.6  14.3 12.6  HCT 43.6 43.0  42.0 37.0  PLT 145*  --   --   CREATININE 0.63 0.70  --     Estimated Creatinine Clearance: 72.5 mL/min (by C-G formula based on SCr of 0.7 mg/dL).   Medical History: Past Medical History:  Diagnosis Date   Acute on chronic respiratory failure with hypoxia and hypercapnia (HCC)    Acute respiratory failure (HCC) 03/29/2021   Atrial fibrillation (HCC)    Bipolar disorder (HCC) 04/27/2020   Cardiac arrest (HCC)    Clostridium difficile diarrhea 08/02/2019   Colitis 03/29/2021   Dehydration    Drug rash    Encephalopathy acute 11/18/2020   Essential hypertension    Fall 02/11/2020   Hypoxia    Morbid obesity (HCC)    Nausea and vomiting 11/17/2020   Osteomyelitis (HCC) 12/03/2019   Pneumonia due to COVID-19 virus 09/15/2019   Pneumonia of lower lobe due to infectious organism 03/29/2021   Severe sepsis (HCC) 12/03/2019   SIRS (systemic inflammatory response syndrome) (HCC) 11/17/2020   Type 2 diabetes mellitus (HCC)    Urinary tract infection without hematuria 09/15/2019   Weakness     Medications:  Scheduled:   atorvastatin  10 mg Per Tube QHS   docusate  100 mg Per Tube BID   insulin aspart  0-9 Units  Subcutaneous Q4H   mouth rinse  15 mL Mouth Rinse Q2H   pantoprazole (PROTONIX) IV  40 mg Intravenous QHS   polyethylene glycol  17 g Per Tube Daily   Infusions:   fentaNYL infusion INTRAVENOUS 100 mcg/hr (09/12/23 1736)   propofol (DIPRIVAN) infusion 5 mcg/kg/min (09/12/23 1831)   Assessment: 77YOF with history of atrial fibrillation on Eliquis who was found to be unresponsive at facility. Last PTA Eliquis dose 1/30 0900. As patient is currently intubated, pharmacy has been consulted to dose heparin.  Hgb 12.6, plt 145. Will obtain aPTTs along with heparin levels until values correlate.  Goal of Therapy:  Heparin level 0.3-0.7 units/ml aPTT 66-102 seconds Monitor platelets by anticoagulation protocol: Yes   Plan:  At 1/30 2100, start heparin infusion at 1200 units/hr Check anti-Xa level and aPTT in 8 hours and daily while on heparin Continue to monitor H&H and platelets  Ginette Otto Senia Even 09/12/2023,6:35 PM

## 2023-09-12 NOTE — Progress Notes (Signed)
Pt transported on vent from [facility] to 3M09 without any complications. RN at bedside, RT will monitor.

## 2023-09-12 NOTE — H&P (Signed)
NAME:  Becky Hendricks, MRN:  [id number], DOB:  Jun 17, 1946, LOS: 0 ADMISSION DATE:  09/12/2023 CONSULTATION DATE:  09/12/2023 REFERRING MD:  Silverio Lay - EDP, CHIEF COMPLAINT:  Unresponsiveness   History of Present Illness:  78 year old woman who presented to [facility] ED 1/30 after she was found unresponsive in her wheelchair at her facility with reported SpO2 75%. PMHx significant for HTN, HLD, PAF, prior cardiac arrest, AoC respiratory failure with hypoxia/hypercapnia (on 3L HOT), T2DM, osteomyelitis, C. Diff, bipolar disorder, functional quadriplegia. Recent presentation to [facility] ED 07/19/2023 for episode of unresponsiveness. Admitted with COVID 03/2023.  History is obtained primarily from chart review. Per EMS, patient was found unresponsive in her wheelchair at her facility with SpO2 75%. Placed on NRB and intubated on ED arrival. Labs were notable for WBC 11.3, Hgb 13.5, Plt 145. Na 141, K 4.8, CO2 32, Cr 0.63 (baseline), LFTs WNL. VBG 7.344/pCO2 65.9/bicarb 35.9. LA 0.8. VPA level WNL. Post-intubation ABG 7.384/pCO2 53.6/pO2 293/bicarb 32. CXR with low lung volumes, no active disease. AXR nonobstructive bowel gas pattern. CT Head NAICA.  PCCM consulted for ICU admission.  Pertinent Medical History:   Past Medical History:  Diagnosis Date   Acute on chronic respiratory failure with hypoxia and hypercapnia (HCC)    Acute respiratory failure (HCC) 03/29/2021   Atrial fibrillation (HCC)    Bipolar disorder (HCC) 04/27/2020   Cardiac arrest (HCC)    Clostridium difficile diarrhea 08/02/2019   Colitis 03/29/2021   Dehydration    Drug rash    Encephalopathy acute 11/18/2020   Essential hypertension    Fall 02/11/2020   Hypoxia    Morbid obesity (HCC)    Nausea and vomiting 11/17/2020   Osteomyelitis (HCC) 12/03/2019   Pneumonia due to COVID-19 virus 09/15/2019   Pneumonia of lower lobe due to infectious organism 03/29/2021   Severe sepsis (HCC) 12/03/2019   SIRS (systemic inflammatory response  syndrome) (HCC) 11/17/2020   Type 2 diabetes mellitus (HCC)    Urinary tract infection without hematuria 09/15/2019   Weakness    Significant Hospital Events: Including procedures, antibiotic start and stop dates in addition to other pertinent events   1/30 - Presented from facility after being found unresponsive in wheelchair hypoxic to 75%. Intubated in ED.  Interim History / Subjective:  PCCM consulted for ICU admission.  Objective:  Blood pressure (!) 89/55, pulse 60, temperature 99.1 F (37.3 C), temperature source Rectal, resp. rate 16, height 5\' 6"  (1.676 m), weight 100 kg, SpO2 100%.    Vent Mode: PRVC FiO2 (%):  [100 %] 100 % Set Rate:  [16 bmp] 16 bmp Vt Set:  [470 mL] 470 mL PEEP:  [5 cmH20] 5 cmH20 Plateau Pressure:  [20 cmH20] 20 cmH20  No intake or output data in the 24 hours ending 09/12/23 1757 Filed Weights   09/12/23 1743  Weight: 100 kg   Physical Examination: General: Acute-on-chronically ill-appearing elderly woman in NAD. HEENT: [place]/AT, anicteric sclera, PERRL 2mm, moist mucous membranes. Neuro:  Awake, blinking - not alert. Lightly sedated.  Does not respond to verbal, tactile or noxious stimuli. Not following commands. No spontaneous movement of extremities noted on my exam. +Corneal, +Cough, and +Gag. +Tremulousness. CV: RRR, no m/g/r. PULM: Breathing even and unlabored on vent (PEEP 5, FiO2 50%). Lung fields coarse throughout. GI: Obese, soft, nontender, nondistended. Normoactive bowel sounds. Extremities: No LE edema noted. Bilateral feet contracted in plantarflexion. Skin: Warm/dry, no rashes.  Resolved Hospital Problem List:    Assessment & Plan:  Acute hypoxemic  respiratory failure, suspect polypharmacy Chronic respiratory failure on HOT 3L - Continue full vent support (4-8cc/kg IBW) - Wean FiO2 for O2 sat > 90% - Daily WUA/SBT as mental status tolerates - VAP bundle - Bronchodilators as indicated (DuoNebs) - Pulmonary hygiene - PAD  protocol for sedation: Propofol and Fentanyl for goal RASS 0 to -1 - F/u PCT, RVP; rule out viral illness - Follow CXR - Consider aspiration coverage  Paroxysmal Afib - Cardiac monitoring - Optimize electrolytes for K > 4, Mg > 2 - Heparin gtt for [facility] for now, transition back to DOAC (Eliquis) as appropriate - F/u Echo  HFpEF HTN HLD History of prior cardiac arrest Echo 03/2021 with EF 60-65%, normal LV function, no RWMAs, moderate LVH, G2DD; normal RV size/function, moderately elevated PASP with RVSP 47.7. - Home meds: Coreg, Jardiance, spironolactone - Hold GDMT for now, resume as clinically appropriate - Resume statin  T2DM - SSI - CBGs Q4H - Goal CBG 140-180  Bipolar disorder - Continue Depakote - Hold home Risperdal, resume as clinically indicated  Best Practice: (right click and "Reselect all SmartList Selections" daily)   Diet/type: NPO DVT prophylaxis: SCDs, heparin gtt, on DOAC PTA GI prophylaxis: PPI Lines: N/A Foley:  Yes, and it is still needed Code Status:  full code Last date of multidisciplinary goals of care discussion [Pending]  Labs:  CBC: Recent Labs  Lab 09/12/23 1601 09/12/23 1609 09/12/23 1704  WBC 11.3*  --   --   HGB 13.5 14.6  14.3 12.6  HCT 43.6 43.0  42.0 37.0  MCV 99.5  --   --   PLT 145*  --   --    Basic Metabolic Panel: Recent Labs  Lab 09/12/23 1601 09/12/23 1609 09/12/23 1704  NA 141 139  139 140  K 4.8 4.7  4.7 4.3  CL 98 98  --   CO2 32  --   --   GLUCOSE 142* 142*  --   BUN 16 20  --   CREATININE 0.63 0.70  --   CALCIUM 9.2  --   --    GFR: Estimated Creatinine Clearance: 72.5 mL/min (by C-G formula based on SCr of 0.7 mg/dL). Recent Labs  Lab 09/12/23 1601 09/12/23 1609  WBC 11.3*  --   LATICACIDVEN  --  0.8   Liver Function Tests: Recent Labs  Lab 09/12/23 1601  AST 10*  ALT 6  ALKPHOS 33*  BILITOT 0.8  PROT 6.4*  ALBUMIN 2.9*   No results for input(s): "LIPASE", "AMYLASE" in the last 168  hours. No results for input(s): "AMMONIA" in the last 168 hours.  ABG:    Component Value Date/Time   PHART 7.384 09/12/2023 1704   PCO2ART 53.6 (H) 09/12/2023 1704   PO2ART 293 (H) 09/12/2023 1704   HCO3 32.2 (H) 09/12/2023 1704   TCO2 34 (H) 09/12/2023 1704   ACIDBASEDEF 1.0 08/31/2022 1408   O2SAT 100 09/12/2023 1704    Coagulation Profile: No results for input(s): "INR", "PROTIME" in the last 168 hours.  Cardiac Enzymes: No results for input(s): "CKTOTAL", "CKMB", "CKMBINDEX", "TROPONINI" in the last 168 hours.  HbA1C: Hgb A1c MFr Bld  Date/Time Value Ref Range Status  04/09/2023 05:47 PM 7.1 (H) 4.8 - 5.6 % Final    Comment:    (NOTE) Pre diabetes:          5.7%-6.4%  Diabetes:              >6.4%  Glycemic control for   <  7.0% adults with diabetes   03/30/2021 03:11 AM 7.0 (H) 4.8 - 5.6 % Final    Comment:    (NOTE) Pre diabetes:          5.7%-6.4%  Diabetes:              >6.4%  Glycemic control for   <7.0% adults with diabetes    CBG: No results for input(s): "GLUCAP" in the last 168 hours.  Review of Systems:   Patient is encephalopathic and/or intubated; therefore, history has been obtained from chart review.   Past Medical History:  She,  has a past medical history of Acute on chronic respiratory failure with hypoxia and hypercapnia (HCC), Acute respiratory failure (HCC) (03/29/2021), Atrial fibrillation (HCC), Bipolar disorder (HCC) (04/27/2020), Cardiac arrest (HCC), Clostridium difficile diarrhea (08/02/2019), Colitis (03/29/2021), Dehydration, Drug rash, Encephalopathy acute (11/18/2020), Essential hypertension, Fall (02/11/2020), Hypoxia, Morbid obesity (HCC), Nausea and vomiting (11/17/2020), Osteomyelitis (HCC) (12/03/2019), Pneumonia due to COVID-19 virus (09/15/2019), Pneumonia of lower lobe due to infectious organism (03/29/2021), Severe sepsis (HCC) (12/03/2019), SIRS (systemic inflammatory response syndrome) (HCC) (11/17/2020), Type 2 diabetes  mellitus (HCC), Urinary tract infection without hematuria (09/15/2019), and Weakness.   Surgical History:   Past Surgical History:  Procedure Laterality Date   INCISION AND DRAINAGE PERIRECTAL ABSCESS Left 12/04/2019   Procedure: IRRIGATION AND DEBRIDEMENT LEFT BUTTOCK ABSCESS;  Surgeon: Violeta Gelinas, MD;  Location: [facility] OR;  Service: General;  Laterality: Left;   INCISION AND DRAINAGE PERIRECTAL ABSCESS Left 12/10/2019   Procedure: REPEAT IRRIGATION AND DEBRIDEMENT BUTTOCK  ABSCESS;  Surgeon: Abigail Miyamoto, MD;  Location: [facility] OR;  Service: General;  Laterality: Left;   IR FLUORO GUIDE CV LINE RIGHT  11/25/2020   IR RADIOLOGIST EVAL & MGMT  12/29/2020   IR US GUIDE VASC ACCESS RIGHT  11/25/2020   Social History:   reports that she has never smoked. She has never used smokeless tobacco. She reports that she does not currently use alcohol. She reports that she does not currently use drugs.   Family History:  Her Family history is unknown by patient.   Allergies: Allergies  Allergen Reactions   Firvanq [Vancomycin] Other (See Comments)    Vancomycin infusion related reaction- May need Vanc to be given at a slower rate    Hibiclens [Chlorhexidine Gluconate] Itching   Diamox [Acetazolamide] Rash   Home Medications: Prior to Admission medications   Medication Sig Start Date End Date Taking? Authorizing Provider  Amino Acids-Protein Hydrolys (PRO-STAT AWC) LIQD Take 30 mLs by mouth 2 (two) times daily between meals.   Yes [provider]  apixaban (ELIQUIS) 5 MG TABS tablet Take 5 mg by mouth 2 (two) times daily.   Yes [provider]  atorvastatin (LIPITOR) 10 MG tablet Take 10 mg by mouth at bedtime.   Yes [provider]  calcium carbonate (TUMS - DOSED IN MG ELEMENTAL CALCIUM) 500 MG chewable tablet Chew 500 mg by mouth daily.   Yes [provider]  carvedilol (COREG) 3.125 MG tablet Take 1 tablet (3.125 mg total) by mouth 2 (two) times daily with a  meal. 09/08/22 10/19/23 Yes Pahwani, Daleen Bo, MD  divalproex (DEPAKOTE) 125 MG DR tablet Take 125 mg by mouth daily at 2 PM.   Yes [provider]  divalproex (DEPAKOTE) 500 MG DR tablet Take 500 mg by mouth 2 (two) times daily.   Yes [provider]  Emollient (EUCERIN SKIN CALMING) CREA Apply 1 application  topically at bedtime. To arms and dry  skin   Yes [provider]  empagliflozin (JARDIANCE) 10 MG TABS tablet Take 10 mg by mouth daily.   Yes [provider]  ergocalciferol (VITAMIN D2) 1.25 MG (50000 UT) capsule Take 50,000 Units by mouth See admin instructions. 50,000 units once daily in the morning on Tuesday, Friday.   Yes [provider]  Ipratropium-Albuterol (COMBIVENT RESPIMAT) 20-100 MCG/ACT AERS respimat Inhale 1 puff into the lungs every 12 (twelve) hours.   Yes [provider]  LORazepam (ATIVAN) 0.5 MG tablet Take 1 tablet (0.5 mg total) by mouth daily. Take 0.5mg  by mouth once a day in addition to twice daily as needed for agitation or anxiety. Patient taking differently: Take 0.5 mg by mouth daily. 04/12/23  Yes Lewie Chamber, MD  metFORMIN (GLUCOPHAGE-XR) 750 MG 24 hr tablet Take 750 mg by mouth 2 (two) times daily with a meal.   Yes [provider]  methocarbamol (ROBAXIN) 500 MG tablet Take 500 mg by mouth 2 (two) times daily.   Yes [provider]  miconazole nitrate (MICATIN) POWD Apply 1 Application topically 2 (two) times daily between meals. Wash and dry peri/groin, inner thighs and buttocks twice daily for yeast then dust with powder.   Yes [provider]  OXYGEN Inhale 1-5 L/min into the lungs continuous. May titrate to keep sats > 92%   Yes [provider]  PSYLLIUM HUSK PO Take 0.4 g by mouth 2 (two) times daily.   Yes [provider]  risperiDONE (RISPERDAL) 2 MG tablet Take 2 mg by mouth daily.   Yes [provider]  risperiDONE (RISPERDAL) 3 MG tablet Take 3 mg by  mouth at bedtime. 11/09/21  Yes [provider]  spironolactone (ALDACTONE) 25 MG tablet Take 12.5 mg by mouth daily. 11/27/21  Yes [provider]  tamsulosin (FLOMAX) 0.4 MG CAPS capsule Take 1 capsule (0.4 mg total) by mouth every other day. 04/20/21  Yes Pennie Banter, DO   Critical care time:   The patient is critically ill with multiple organ system failure and requires high complexity decision making for assessment and support, frequent evaluation and titration of therapies, advanced monitoring, review of radiographic studies and interpretation of complex data.   Critical Care Time devoted to patient care services, exclusive of separately billable procedures, described in this note is 41 minutes.  Tim Lair, PA-C  Pulmonary & Critical Care 09/12/23 5:57 PM  Please see [email or web address] for pager details.  From 7A-7P if no response, please call [phone] After hours, please call ELink [phone]

## 2023-09-13 ENCOUNTER — Inpatient Hospital Stay (HOSPITAL_COMMUNITY): Payer: Medicare Other

## 2023-09-13 DIAGNOSIS — R0603 Acute respiratory distress: Secondary | ICD-10-CM | POA: Diagnosis not present

## 2023-09-13 DIAGNOSIS — J9601 Acute respiratory failure with hypoxia: Secondary | ICD-10-CM | POA: Diagnosis not present

## 2023-09-13 LAB — CBC
HCT: 35.8 % — ABNORMAL LOW (ref 36.0–46.0)
Hemoglobin: 11.4 g/dL — ABNORMAL LOW (ref 12.0–15.0)
MCH: 30.6 pg (ref 26.0–34.0)
MCHC: 31.8 g/dL (ref 30.0–36.0)
MCV: 96.2 fL (ref 80.0–100.0)
Platelets: 136 10*3/uL — ABNORMAL LOW (ref 150–400)
RBC: 3.72 MIL/uL — ABNORMAL LOW (ref 3.87–5.11)
RDW: 13.9 % (ref 11.5–15.5)
WBC: 9.4 10*3/uL (ref 4.0–10.5)
nRBC: 0.6 % — ABNORMAL HIGH (ref 0.0–0.2)

## 2023-09-13 LAB — BASIC METABOLIC PANEL
Anion gap: 9 (ref 5–15)
BUN: 19 mg/dL (ref 8–23)
CO2: 26 mmol/L (ref 22–32)
Calcium: 8.3 mg/dL — ABNORMAL LOW (ref 8.9–10.3)
Chloride: 103 mmol/L (ref 98–111)
Creatinine, Ser: 0.6 mg/dL (ref 0.44–1.00)
GFR, Estimated: >60 mL/min (ref >60)
Glucose, Bld: 124 mg/dL — ABNORMAL HIGH (ref 70–99)
Potassium: 4.1 mmol/L (ref 3.5–5.1)
Sodium: 138 mmol/L (ref 135–145)

## 2023-09-13 LAB — ECHOCARDIOGRAM COMPLETE
AR max vel: 2.3 cm2
AV Area VTI: 2.16 cm2
AV Area mean vel: 2.22 cm2
AV Mean grad: 6.6 mm[Hg]
AV Peak grad: 12.5 mm[Hg]
Ao pk vel: 1.77 m/s
Area-P 1/2: 3.27 cm2
Height: 66 in
S' Lateral: 2.7 cm
Weight: 3220.48 [oz_av]

## 2023-09-13 LAB — AMMONIA: Ammonia: 31 umol/L (ref 9–35)

## 2023-09-13 LAB — GLUCOSE, CAPILLARY
Glucose-Capillary: 120 mg/dL — ABNORMAL HIGH (ref 70–99)
Glucose-Capillary: 118 mg/dL — ABNORMAL HIGH (ref 70–99)
Glucose-Capillary: 109 mg/dL — ABNORMAL HIGH (ref 70–99)
Glucose-Capillary: 144 mg/dL — ABNORMAL HIGH (ref 70–99)
Glucose-Capillary: 135 mg/dL — ABNORMAL HIGH (ref 70–99)
Glucose-Capillary: 119 mg/dL — ABNORMAL HIGH (ref 70–99)

## 2023-09-13 LAB — MAGNESIUM: Magnesium: 1.7 mg/dL (ref 1.7–2.4)

## 2023-09-13 LAB — APTT
aPTT: 61 s — ABNORMAL HIGH (ref 24–36)
aPTT: 86 s — ABNORMAL HIGH (ref 24–36)

## 2023-09-13 LAB — PHOSPHORUS: Phosphorus: 2.6 mg/dL (ref 2.5–4.6)

## 2023-09-13 LAB — TRIGLYCERIDES: Triglycerides: 167 mg/dL — ABNORMAL HIGH (ref <150)

## 2023-09-13 LAB — HEPARIN LEVEL (UNFRACTIONATED): Heparin Unfractionated: 0.95 [IU]/mL — ABNORMAL HIGH (ref 0.30–0.70)

## 2023-09-13 MED ORDER — DOCUSATE SODIUM 50 MG/5ML PO LIQD: 100.0000 mg | Freq: Two times a day (BID) | ORAL | Status: DC

## 2023-09-13 MED ORDER — ATORVASTATIN CALCIUM 10 MG PO TABS
10.0000 mg | ORAL_TABLET | Freq: Every day | ORAL | Status: DC
  Administered 2023-09-14: 10 mg via ORAL
  Filled 2023-09-13: qty 1

## 2023-09-13 MED ORDER — MAGNESIUM SULFATE 2 GM/50ML IV SOLN
2.0000 g | Freq: Once | INTRAVENOUS | Status: DC
  Filled 2023-09-13: qty 50

## 2023-09-13 MED ORDER — POLYETHYLENE GLYCOL 3350 17 G PO PACK
17.0000 g | PACK | Freq: Every day | ORAL | Status: DC
  Administered 2023-09-15: 17 g via ORAL
  Filled 2023-09-13: qty 1

## 2023-09-13 MED ORDER — DOCUSATE SODIUM 100 MG PO CAPS
100.0000 mg | ORAL_CAPSULE | Freq: Two times a day (BID) | ORAL | Status: DC
  Administered 2023-09-14: 100 mg via ORAL
  Filled 2023-09-13 (×2): qty 1

## 2023-09-13 MED ORDER — MAGNESIUM SULFATE 2 GM/50ML IV SOLN
2.0000 g | Freq: Once | INTRAVENOUS | Status: AC
  Administered 2023-09-13: 2 g via INTRAVENOUS
  Filled 2023-09-13: qty 50

## 2023-09-13 MED ORDER — PERFLUTREN LIPID MICROSPHERE
1.0000 mL | INTRAVENOUS | Status: AC | PRN
  Administered 2023-09-13: 4 mL via INTRAVENOUS

## 2023-09-13 MED ORDER — ACETAMINOPHEN 650 MG RE SUPP
650.0000 mg | RECTAL | Status: AC | PRN
  Administered 2023-09-13 – 2023-09-14 (×2): 650 mg via RECTAL
  Filled 2023-09-13 (×2): qty 1

## 2023-09-13 MED ORDER — LACTATED RINGERS IV BOLUS
500.0000 mL | Freq: Once | INTRAVENOUS | Status: AC
Start: 2023-09-13 — End: 2023-09-13
  Administered 2023-09-13: 500 mL via INTRAVENOUS

## 2023-09-13 MED ORDER — VALPROATE SODIUM 100 MG/ML IV SOLN
500.0000 mg | Freq: Two times a day (BID) | INTRAVENOUS | Status: DC
  Administered 2023-09-13 – 2023-09-14 (×2): 500 mg via INTRAVENOUS
  Filled 2023-09-13 (×3): qty 5

## 2023-09-13 MED ORDER — DIVALPROEX SODIUM 500 MG PO DR TAB
500.0000 mg | DELAYED_RELEASE_TABLET | Freq: Two times a day (BID) | ORAL | Status: DC
Start: 2023-09-13 — End: 2023-09-13
  Filled 2023-09-13: qty 1

## 2023-09-13 NOTE — Progress Notes (Signed)
[facility] ADULT ICU REPLACEMENT PROTOCOL   The patient does apply for the [facility] Adult ICU Electrolyte Replacment Protocol based on the criteria listed below:   1.Exclusion criteria: TCTS, ECMO, Dialysis, and Myasthenia Gravis patients 2. Is GFR >/= 30 ml/min? Yes.    Patient's GFR today is >60 3. Is SCr </= 2? Yes.   Patient's SCr is 0.60 mg/dL 4. Did SCr increase >/= 0.5 in 24 hours? No. 5.Pt's weight >40kg  Yes.   6. Abnormal electrolyte(s): mag 1.7  7. Electrolytes replaced per protocol 8.  Call MD STAT for K+ </= 2.5, Phos </= 1, or Mag </= 1 Physician:  protocol  Becky Hendricks 09/13/2023 5:24 AM

## 2023-09-13 NOTE — Progress Notes (Signed)
PHARMACY - ANTICOAGULATION CONSULT NOTE  Pharmacy Consult for Heparin Indication: atrial fibrillation  Allergies  Allergen Reactions   Firvanq [Vancomycin] Other (See Comments)    Vancomycin infusion related reaction- May need Vanc to be given at a slower rate    Hibiclens [Chlorhexidine Gluconate] Itching   Diamox [Acetazolamide] Rash    Patient Measurements: Height: 5\' 6"  (167.6 cm) Weight: 91.3 kg (201 lb 4.5 oz) IBW/kg (Calculated) : 59.3 Heparin Dosing Weight: 81.9 kg  Vital Signs: Temp: 99.7 F (37.6 C) (01/31 1124) Temp Source: Oral (01/31 1124) BP: 130/63 (01/31 1315) Pulse Rate: 92 (01/31 1315)  Labs: Recent Labs    09/12/23 1601 09/12/23 1609 09/12/23 1704 09/13/23 0303 09/13/23 1253  HGB 13.5 14.6  14.3 12.6 11.4*  --   HCT 43.6 43.0  42.0 37.0 35.8*  --   PLT 145*  --   --  136*  --   APTT  --   --   --  61* 86*  HEPARINUNFRC  --   --   --  0.95*  --   CREATININE 0.63 0.70  --  0.60  --     Estimated Creatinine Clearance: 69.2 mL/min (by C-G formula based on SCr of 0.6 mg/dL).  Assessment: 77YOF with history of atrial fibrillation on Eliquis who was found to be unresponsive at facility. Last PTA Eliquis dose 1/30 0900. As patient is currently intubated, pharmacy has been consulted to dose heparin.  -Heparin level falsely elevated due to recent Eliquis use and aPTT below goal on 1200 units/hr. Per RN, no signs/symptoms of bleeding or issues with the heparin infusion running continuously. CBC shows Hgb 12.6 > 11.4, plts 145 > 136.   aPTT therapeutic at 86 while on heparin 1400u/hr. No issues with infusion noted.   Goal of Therapy:  Heparin level 0.3-0.7 units/ml aPTT 66-102 seconds Monitor platelets by anticoagulation protocol: Yes   Plan:  Continue heparin infusion 1400 units/hr Check aPTT and heparin level in 8 hours  Continue to check both aPTT and heparin levels until correlation, then switch to heparin levels Continue to monitor H&H and  platelets  Estill Batten, PharmD, BCCCP  Clinical Pharmacist 09/13/2023 2:06 PM

## 2023-09-13 NOTE — Progress Notes (Signed)
*  PRELIMINARY RESULTS* Echocardiogram 2D Echocardiogram has been performed.  Laddie Aquas 09/13/2023, 2:45 PM

## 2023-09-13 NOTE — Progress Notes (Signed)
NAME:  Becky Hendricks, MRN:  [id number], DOB:  1945/10/07, LOS: 1 ADMISSION DATE:  09/12/2023 CONSULTATION DATE:  09/12/2023 REFERRING MD:  Silverio Lay - EDP, CHIEF COMPLAINT:  Unresponsiveness   History of Present Illness:  78 year old woman who presented to [facility] ED 1/30 after she was found unresponsive in her wheelchair at her facility with reported SpO2 75%. PMHx significant for HTN, HLD, PAF, prior cardiac arrest, AoC respiratory failure with hypoxia/hypercapnia (on 3L HOT), T2DM, osteomyelitis, C. Diff, bipolar disorder, functional quadriplegia. Recent presentation to [facility] ED 07/19/2023 for episode of unresponsiveness. Admitted with COVID 03/2023.  History is obtained primarily from chart review. Per EMS, patient was found unresponsive in her wheelchair at her facility with SpO2 75%. Placed on NRB and intubated on ED arrival. Labs were notable for WBC 11.3, Hgb 13.5, Plt 145. Na 141, K 4.8, CO2 32, Cr 0.63 (baseline), LFTs WNL. VBG 7.344/pCO2 65.9/bicarb 35.9. LA 0.8. VPA level WNL. Post-intubation ABG 7.384/pCO2 53.6/pO2 293/bicarb 32. CXR with low lung volumes, no active disease. AXR nonobstructive bowel gas pattern. CT Head NAICA.  PCCM consulted for ICU admission.  Pertinent Medical History:   Past Medical History:  Diagnosis Date   Acute on chronic respiratory failure with hypoxia and hypercapnia (HCC)    Acute respiratory failure (HCC) 03/29/2021   Atrial fibrillation (HCC)    Bipolar disorder (HCC) 04/27/2020   Cardiac arrest (HCC)    Clostridium difficile diarrhea 08/02/2019   Colitis 03/29/2021   Dehydration    Drug rash    Encephalopathy acute 11/18/2020   Essential hypertension    Fall 02/11/2020   Hypoxia    Morbid obesity (HCC)    Nausea and vomiting 11/17/2020   Osteomyelitis (HCC) 12/03/2019   Pneumonia due to COVID-19 virus 09/15/2019   Pneumonia of lower lobe due to infectious organism 03/29/2021   Severe sepsis (HCC) 12/03/2019   SIRS (systemic inflammatory response  syndrome) (HCC) 11/17/2020   Type 2 diabetes mellitus (HCC)    Urinary tract infection without hematuria 09/15/2019   Weakness    Significant Hospital Events: Including procedures, antibiotic start and stop dates in addition to other pertinent events   1/30 - Presented from facility after being found unresponsive in wheelchair hypoxic to 75%. Intubated in ED.  Interim History / Subjective:  No events.  Objective:  Blood pressure 124/62, pulse 71, temperature 99.7 F (37.6 C), temperature source Oral, resp. rate 15, height 5\' 6"  (1.676 m), weight 91.3 kg, SpO2 95%.    Vent Mode: PRVC FiO2 (%):  [40 %-100 %] 40 % Set Rate:  [16 bmp] 16 bmp Vt Set:  [470 mL] 470 mL PEEP:  [5 cmH20] 5 cmH20 Plateau Pressure:  [18 cmH20-21 cmH20] 19 cmH20   Intake/Output Summary (Last 24 hours) at 09/13/2023 1159 Last data filed at 09/13/2023 1000 Gross per 24 hour  Intake 1907.68 ml  Output 445 ml  Net 1462.68 ml   Filed Weights   09/12/23 1743 09/12/23 1902 09/13/23 0500  Weight: 100 kg 88.1 kg 91.3 kg   Physical Examination: Chronically ill appearing Moves to command R tremor noted Lungs clear on PS Moves to command Abd soft Sacral wound dressed  Labs benign  Resolved Hospital Problem List:    Assessment & Plan:  Acute hypoxemic respiratory failure, suspect polypharmacy RSV+ Shock state after intubation- weaning now Chronic respiratory failure on HOT 3L Paroxysmal Afib HFpEF HTN HLD History of prior cardiac arrest T2DM Bipolar disorder  - Check ammonia - f/u echo (?why ordered), continue heparin gtt  for now; back to eliquis once we assure no procedures needed; resume GDMT PRN - Wean to extubate - Careful med review with PharmD  31 min cc time Myrla Halsted MD PCCM

## 2023-09-13 NOTE — Progress Notes (Signed)
PHARMACY - ANTICOAGULATION CONSULT NOTE  Pharmacy Consult for Heparin Indication: atrial fibrillation  Allergies  Allergen Reactions   Firvanq [Vancomycin] Other (See Comments)    Vancomycin infusion related reaction- May need Vanc to be given at a slower rate    Hibiclens [Chlorhexidine Gluconate] Itching   Diamox [Acetazolamide] Rash    Patient Measurements: Height: 5\' 6"  (167.6 cm) Weight: 88.1 kg (194 lb 3.6 oz) IBW/kg (Calculated) : 59.3 Heparin Dosing Weight: 81.9 kg  Vital Signs: Temp: 99.4 F (37.4 C) (01/31 0352) Temp Source: Axillary (01/31 0352) BP: 107/70 (01/31 0415) Pulse Rate: 70 (01/31 0430)  Labs: Recent Labs    09/12/23 1601 09/12/23 1609 09/12/23 1704 09/13/23 0303  HGB 13.5 14.6  14.3 12.6 11.4*  HCT 43.6 43.0  42.0 37.0 35.8*  PLT 145*  --   --  136*  APTT  --   --   --  61*  HEPARINUNFRC  --   --   --  0.95*  CREATININE 0.63 0.70  --  0.60    Estimated Creatinine Clearance: 67.9 mL/min (by C-G formula based on SCr of 0.6 mg/dL).  Assessment: 77YOF with history of atrial fibrillation on Eliquis who was found to be unresponsive at facility. Last PTA Eliquis dose 1/30 0900. As patient is currently intubated, pharmacy has been consulted to dose heparin.  -Heparin level falsely elevated due to recent Eliquis use and aPTT below goal on 1200 units/hr. Per RN, no signs/symptoms of bleeding or issues with the heparin infusion running continuously. CBC shows Hgb 12.6 > 11.4, plts 145 > 136  Goal of Therapy:  Heparin level 0.3-0.7 units/ml aPTT 66-102 seconds Monitor platelets by anticoagulation protocol: Yes   Plan:  Increase heparin infusion to 1400 units/hr Check aPTT in 8 hours  Continue to check both aPTT and heparin levels until correlation, then switch to heparin levels Continue to monitor H&H and platelets  Arabella Merles, PharmD. Clinical Pharmacist 09/13/2023 4:33 AM

## 2023-09-13 NOTE — Consult Note (Addendum)
WOC Nurse Consult Note: Reason for Consult:Consult requested for sacrum.  Performed remotely after review of progress notes and photo in the EMR. Pt is critically ill with multiple systemic factors which can impair healing.  They are on a low airloss mattress to reduce pressure.  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  Pressure Injury POA: Yes Dressing procedure/placement/frequency: Topical treatment orders provided for bedside nurses to perform as follows to absorb drainage and provide antimicrobial benifits: Cut small piece of Aquacel [place] # [phone]) and tuck into sacrum wound Q day, using swab to fill, then cover with foam dressing.  Change foam dressing Q 3 days or PRN soiling. Please re-consult if further assistance is needed.  Thank-you,  Cammie Mcgee MSN, RN, CWOCN, [place], CNS [phone]

## 2023-09-13 NOTE — Procedures (Signed)
Extubation Procedure Note  Patient Details:   Name: Becky Hendricks DOB: 11-May-1946 MRN: [id number]   Airway Documentation:  Airway 7.5 mm (Active)   Vent end date: 09/13/23 Vent end time: 1250   Evaluation  O2 sats: stable throughout Complications: No apparent complications Patient did tolerate procedure well. Bilateral Breath Sounds: Clear, Diminished   Yes  Pt extubated per physician order. Pt suctioned via ETT and orally prior, with a positive cuff leak. Upon extubation pt placed on 4L nasal cannula without complication. Pt able to speak name, give a good cough and no stridor was heard. RT will continue to be available as needed.   Derinda Late 09/13/2023, 12:54 PM

## 2023-09-14 ENCOUNTER — Inpatient Hospital Stay (HOSPITAL_COMMUNITY): Payer: Medicare Other

## 2023-09-14 DIAGNOSIS — J9622 Acute and chronic respiratory failure with hypercapnia: Secondary | ICD-10-CM

## 2023-09-14 DIAGNOSIS — J9601 Acute respiratory failure with hypoxia: Secondary | ICD-10-CM | POA: Diagnosis not present

## 2023-09-14 DIAGNOSIS — B974 Respiratory syncytial virus as the cause of diseases classified elsewhere: Secondary | ICD-10-CM | POA: Diagnosis not present

## 2023-09-14 LAB — APTT: aPTT: 77 s — ABNORMAL HIGH (ref 24–36)

## 2023-09-14 LAB — GLUCOSE, CAPILLARY
Glucose-Capillary: 102 mg/dL — ABNORMAL HIGH (ref 70–99)
Glucose-Capillary: 115 mg/dL — ABNORMAL HIGH (ref 70–99)
Glucose-Capillary: 127 mg/dL — ABNORMAL HIGH (ref 70–99)
Glucose-Capillary: 137 mg/dL — ABNORMAL HIGH (ref 70–99)
Glucose-Capillary: 151 mg/dL — ABNORMAL HIGH (ref 70–99)
Glucose-Capillary: 190 mg/dL — ABNORMAL HIGH (ref 70–99)
Glucose-Capillary: 97 mg/dL (ref 70–99)

## 2023-09-14 LAB — POCT I-STAT 7, (LYTES, BLD GAS, ICA,H+H)
Acid-Base Excess: 7 mmol/L — ABNORMAL HIGH (ref 0.0–2.0)
Acid-Base Excess: 4 mmol/L — ABNORMAL HIGH (ref 0.0–2.0)
Bicarbonate: 32.3 mmol/L — ABNORMAL HIGH (ref 20.0–28.0)
Bicarbonate: 30.7 mmol/L — ABNORMAL HIGH (ref 20.0–28.0)
Calcium, Ion: 1.22 mmol/L (ref 1.15–1.40)
Calcium, Ion: 1.2 mmol/L (ref 1.15–1.40)
HCT: 33 % — ABNORMAL LOW (ref 36.0–46.0)
HCT: 33 % — ABNORMAL LOW (ref 36.0–46.0)
Hemoglobin: 11.2 g/dL — ABNORMAL LOW (ref 12.0–15.0)
Hemoglobin: 11.2 g/dL — ABNORMAL LOW (ref 12.0–15.0)
O2 Saturation: 99 %
O2 Saturation: 91 %
Patient temperature: 99.6
Patient temperature: 36.7
Potassium: 3.8 mmol/L (ref 3.5–5.1)
Potassium: 4.6 mmol/L (ref 3.5–5.1)
Sodium: 134 mmol/L — ABNORMAL LOW (ref 135–145)
Sodium: 135 mmol/L (ref 135–145)
TCO2: 34 mmol/L — ABNORMAL HIGH (ref 22–32)
TCO2: 33 mmol/L — ABNORMAL HIGH (ref 22–32)
pCO2 arterial: 48 mm[Hg] (ref 32–48)
pCO2 arterial: 57.8 mm[Hg] — ABNORMAL HIGH (ref 32–48)
pH, Arterial: 7.438 (ref 7.35–7.45)
pH, Arterial: 7.332 — ABNORMAL LOW (ref 7.35–7.45)
pO2, Arterial: 124 mm[Hg] — ABNORMAL HIGH (ref 83–108)
pO2, Arterial: 67 mm[Hg] — ABNORMAL LOW (ref 83–108)

## 2023-09-14 LAB — CBC
HCT: 36.8 % (ref 36.0–46.0)
Hemoglobin: 11.5 g/dL — ABNORMAL LOW (ref 12.0–15.0)
MCH: 30.7 pg (ref 26.0–34.0)
MCHC: 31.3 g/dL (ref 30.0–36.0)
MCV: 98.4 fL (ref 80.0–100.0)
Platelets: 140 10*3/uL — ABNORMAL LOW (ref 150–400)
RBC: 3.74 MIL/uL — ABNORMAL LOW (ref 3.87–5.11)
RDW: 14.4 % (ref 11.5–15.5)
WBC: 11.7 10*3/uL — ABNORMAL HIGH (ref 4.0–10.5)
nRBC: 0.3 % — ABNORMAL HIGH (ref 0.0–0.2)

## 2023-09-14 LAB — HEPARIN LEVEL (UNFRACTIONATED): Heparin Unfractionated: 0.56 [IU]/mL (ref 0.30–0.70)

## 2023-09-14 LAB — BASIC METABOLIC PANEL
Anion gap: 14 (ref 5–15)
BUN: 13 mg/dL (ref 8–23)
CO2: 25 mmol/L (ref 22–32)
Calcium: 8.2 mg/dL — ABNORMAL LOW (ref 8.9–10.3)
Chloride: 100 mmol/L (ref 98–111)
Creatinine, Ser: 0.75 mg/dL (ref 0.44–1.00)
GFR, Estimated: >60 mL/min (ref >60)
Glucose, Bld: 123 mg/dL — ABNORMAL HIGH (ref 70–99)
Potassium: 3.8 mmol/L (ref 3.5–5.1)
Sodium: 139 mmol/L (ref 135–145)

## 2023-09-14 LAB — MAGNESIUM: Magnesium: 2.1 mg/dL (ref 1.7–2.4)

## 2023-09-14 MED ORDER — FAMOTIDINE 20 MG PO TABS
20.0000 mg | ORAL_TABLET | Freq: Two times a day (BID) | ORAL | Status: DC
  Administered 2023-09-14 – 2023-09-23 (×18): 20 mg
  Filled 2023-09-14 (×20): qty 1

## 2023-09-14 MED ORDER — ORAL CARE MOUTH RINSE
15.0000 mL | OROMUCOSAL | Status: DC
  Administered 2023-09-14 – 2023-09-17 (×17): 15 mL via OROMUCOSAL

## 2023-09-14 MED ORDER — SPIRONOLACTONE 12.5 MG HALF TABLET
12.5000 mg | ORAL_TABLET | Freq: Every day | ORAL | Status: DC
  Administered 2023-09-14: 12.5 mg via ORAL
  Filled 2023-09-14: qty 1

## 2023-09-14 MED ORDER — CARVEDILOL 3.125 MG PO TABS
3.1250 mg | ORAL_TABLET | Freq: Two times a day (BID) | ORAL | Status: DC
  Administered 2023-09-14: 3.125 mg via ORAL
  Filled 2023-09-14: qty 1

## 2023-09-14 MED ORDER — ORAL CARE MOUTH RINSE: 15.0000 mL | OROMUCOSAL | Status: DC | PRN

## 2023-09-14 MED ORDER — EMPAGLIFLOZIN 10 MG PO TABS
10.0000 mg | ORAL_TABLET | Freq: Every day | ORAL | Status: DC
Start: 2023-09-14 — End: 2023-09-15
  Administered 2023-09-14: 10 mg via ORAL
  Filled 2023-09-14 (×2): qty 1

## 2023-09-14 MED ORDER — FENTANYL 2500MCG IN NS 250ML (10MCG/ML) PREMIX INFUSION
0.0000 ug/h | INTRAVENOUS | Status: DC
  Administered 2023-09-14: 25 ug/h via INTRAVENOUS
  Administered 2023-09-16: 100 ug/h via INTRAVENOUS
  Filled 2023-09-14: qty 250

## 2023-09-14 MED ORDER — DEXMEDETOMIDINE HCL IN NACL 400 MCG/100ML IV SOLN
INTRAVENOUS | Status: AC
  Filled 2023-09-14: qty 100

## 2023-09-14 MED ORDER — ORAL CARE MOUTH RINSE: 15.0000 mL | OROMUCOSAL | Status: DC

## 2023-09-14 MED ORDER — FENTANYL CITRATE PF 50 MCG/ML IJ SOSY: 100.0000 ug | PREFILLED_SYRINGE | Freq: Once | INTRAMUSCULAR | Status: AC

## 2023-09-14 MED ORDER — MIDAZOLAM HCL 2 MG/2ML IJ SOLN
INTRAMUSCULAR | Status: AC
  Administered 2023-09-14: 2 mg via INTRAVENOUS
  Filled 2023-09-14: qty 2

## 2023-09-14 MED ORDER — DIVALPROEX SODIUM 500 MG PO DR TAB
500.0000 mg | DELAYED_RELEASE_TABLET | Freq: Two times a day (BID) | ORAL | Status: DC
  Administered 2023-09-14: 500 mg via ORAL
  Filled 2023-09-14 (×3): qty 1

## 2023-09-14 MED ORDER — FENTANYL BOLUS VIA INFUSION: 25.0000 ug | INTRAVENOUS | Status: DC | PRN

## 2023-09-14 MED ORDER — NOREPINEPHRINE 4 MG/250ML-% IV SOLN
INTRAVENOUS | Status: AC
  Filled 2023-09-14: qty 250

## 2023-09-14 MED ORDER — DEXMEDETOMIDINE HCL IN NACL 400 MCG/100ML IV SOLN
0.0000 ug/kg/h | INTRAVENOUS | Status: DC
  Administered 2023-09-14: 0.4 ug/kg/h via INTRAVENOUS

## 2023-09-14 MED ORDER — FENTANYL 2500MCG IN NS 250ML (10MCG/ML) PREMIX INFUSION
INTRAVENOUS | Status: AC
  Filled 2023-09-14: qty 250

## 2023-09-14 MED ORDER — APIXABAN 5 MG PO TABS
5.0000 mg | ORAL_TABLET | Freq: Two times a day (BID) | ORAL | Status: DC
  Administered 2023-09-14 (×2): 5 mg via ORAL
  Filled 2023-09-14 (×3): qty 1

## 2023-09-14 MED ORDER — ETOMIDATE 2 MG/ML IV SOLN
INTRAVENOUS | Status: AC
  Filled 2023-09-14: qty 10

## 2023-09-14 MED ORDER — NOREPINEPHRINE 4 MG/250ML-% IV SOLN
0.0000 ug/min | INTRAVENOUS | Status: DC
  Administered 2023-09-14: 2 ug/min via INTRAVENOUS
  Filled 2023-09-14: qty 250

## 2023-09-14 MED ORDER — MIDAZOLAM HCL 2 MG/2ML IJ SOLN
2.0000 mg | Freq: Once | INTRAMUSCULAR | Status: AC
Start: 2023-09-14 — End: 2023-09-14

## 2023-09-14 MED ORDER — SODIUM CHLORIDE 0.9 % IV SOLN
250.0000 mL | INTRAVENOUS | Status: AC
  Administered 2023-09-14: 250 mL via INTRAVENOUS

## 2023-09-14 MED ORDER — ETOMIDATE 2 MG/ML IV SOLN
INTRAVENOUS | Status: AC
  Filled 2023-09-14: qty 20

## 2023-09-14 MED ORDER — FENTANYL CITRATE PF 50 MCG/ML IJ SOSY
PREFILLED_SYRINGE | INTRAMUSCULAR | Status: AC
  Administered 2023-09-14: 50 ug via INTRAVENOUS
  Filled 2023-09-14: qty 2

## 2023-09-14 MED ORDER — RISPERIDONE 3 MG PO TABS
3.0000 mg | ORAL_TABLET | Freq: Every day | ORAL | Status: DC
  Administered 2023-09-14: 3 mg via ORAL
  Filled 2023-09-14 (×2): qty 1

## 2023-09-14 MED ORDER — FENTANYL CITRATE PF 50 MCG/ML IJ SOSY
25.0000 ug | PREFILLED_SYRINGE | Freq: Once | INTRAMUSCULAR | Status: AC
Start: 2023-09-14 — End: 2023-09-14
  Administered 2023-09-14: 25 ug via INTRAVENOUS

## 2023-09-14 MED ORDER — ROCURONIUM BROMIDE 10 MG/ML (PF) SYRINGE
PREFILLED_SYRINGE | INTRAVENOUS | Status: AC
  Administered 2023-09-14: 100 mg
  Filled 2023-09-14: qty 10

## 2023-09-14 MED ORDER — MIDAZOLAM HCL 2 MG/2ML IJ SOLN
INTRAMUSCULAR | Status: AC
  Filled 2023-09-14: qty 4

## 2023-09-14 NOTE — Progress Notes (Addendum)
eLink Physician-Brief Progress Note Patient Name: Becky Hendricks DOB: 1946/01/22 MRN: [id number]   Date of Service  09/14/2023  HPI/Events of Note  Patient emergently intubated for progressive hypoxemia, altered breathing, increased work of breathing.  Notified by ground team that low blood pressures persist.    eICU Interventions  Discontinue carvedilol and spironolactone  Start peripheral norepinephrine.  Endotracheal tube and enteric tube in appropriate positions.  Okay to use orders placed.  Okay to use  Synchronous with the ventilator, increase PEEP to 10, maintain respiratory rate and volume.     Intervention Category Intermediate Interventions: Hypotension - evaluation and management  Mauricio Dahlen 09/14/2023, 8:19 PM

## 2023-09-14 NOTE — Procedures (Signed)
Intubation Procedure Note  Carolanne Mercier  [id number]  [birth year]  Date:09/14/23  Time:7:10 PM   Provider Performing:Birdie Fetty E  Cherlynn Polo    Procedure: Intubation (31500)  Indication(s) Respiratory Failure  Consent Unable to obtain consent due to emergent nature of procedure.   Anesthesia Versed, Fentanyl, and Rocuronium   Time Out Verified patient identification, verified procedure, site/side was marked, verified correct patient position, special equipment/implants available, medications/allergies/relevant history reviewed, required imaging and test results available.   Sterile Technique Usual hand hygeine, masks, and gloves were used   Procedure Description Patient positioned in bed supine.  Sedation given as noted above.  Patient was intubated with endotracheal tube using Glidescope.  View was Grade 1 full glottis .  Number of attempts was 1.  Colorimetric CO2 detector was consistent with tracheal placement.   Complications/Tolerance None; patient tolerated the procedure well. Chest X-ray is ordered to verify placement.   EBL Minimal   Specimen(s) None   With Dr. Durwin Nora, PA-C [facility] 09/14/23 7:10 PM  Please see [email or web address] for pager details.  From 7A-7P if no response, please call [phone] After hours, please call ELink [phone]

## 2023-09-14 NOTE — Progress Notes (Signed)
NAME:  Becky Hendricks, MRN:  [id number], DOB:  Jan 12, 1946, LOS: 2 ADMISSION DATE:  09/12/2023 CONSULTATION DATE:  09/12/2023 REFERRING MD:  Silverio Lay - EDP, CHIEF COMPLAINT:  Unresponsiveness   History of Present Illness:  78 year old woman who presented to [facility] ED 1/30 after she was found unresponsive in her wheelchair at her facility with reported SpO2 75%. PMHx significant for HTN, HLD, PAF, prior cardiac arrest, AoC respiratory failure with hypoxia/hypercapnia (on 3L HOT), T2DM, osteomyelitis, C. Diff, bipolar disorder, functional quadriplegia. Recent presentation to [facility] ED 07/19/2023 for episode of unresponsiveness. Admitted with COVID 03/2023.  History is obtained primarily from chart review. Per EMS, patient was found unresponsive in her wheelchair at her facility with SpO2 75%. Placed on NRB and intubated on ED arrival. Labs were notable for WBC 11.3, Hgb 13.5, Plt 145. Na 141, K 4.8, CO2 32, Cr 0.63 (baseline), LFTs WNL. VBG 7.344/pCO2 65.9/bicarb 35.9. LA 0.8. VPA level WNL. Post-intubation ABG 7.384/pCO2 53.6/pO2 293/bicarb 32. CXR with low lung volumes, no active disease. AXR nonobstructive bowel gas pattern. CT Head NAICA.  PCCM consulted for ICU admission.  Pertinent Medical History:   Past Medical History:  Diagnosis Date   Acute on chronic respiratory failure with hypoxia and hypercapnia (HCC)    Acute respiratory failure (HCC) 03/29/2021   Atrial fibrillation (HCC)    Bipolar disorder (HCC) 04/27/2020   Cardiac arrest (HCC)    Clostridium difficile diarrhea 08/02/2019   Colitis 03/29/2021   Dehydration    Drug rash    Encephalopathy acute 11/18/2020   Essential hypertension    Fall 02/11/2020   Hypoxia    Morbid obesity (HCC)    Nausea and vomiting 11/17/2020   Osteomyelitis (HCC) 12/03/2019   Pneumonia due to COVID-19 virus 09/15/2019   Pneumonia of lower lobe due to infectious organism 03/29/2021   Severe sepsis (HCC) 12/03/2019   SIRS (systemic inflammatory response  syndrome) (HCC) 11/17/2020   Type 2 diabetes mellitus (HCC)    Urinary tract infection without hematuria 09/15/2019   Weakness    Significant Hospital Events: Including procedures, antibiotic start and stop dates in addition to other pertinent events   1/30 - Presented from facility after being found unresponsive in wheelchair hypoxic to 75%. Intubated in ED.  Interim History / Subjective:  Pretty somnolent this am so placed on BIPAP. Follows commands, to try off again later this am.  Objective:  Blood pressure (!) 108/96, pulse 77, temperature 99.6 F (37.6 C), temperature source Oral, resp. rate (!) 24, height 5\' 6"  (1.676 m), weight 88.2 kg, SpO2 93%.    Vent Mode: BIPAP FiO2 (%):  [40 %-65 %] 60 % PEEP:  [5 cmH20-10 cmH20] 10 cmH20 Pressure Support:  [12 cmH20-16 cmH20] 12 cmH20   Intake/Output Summary (Last 24 hours) at 09/14/2023 1138 Last data filed at 09/14/2023 0800 Gross per 24 hour  Intake 473.58 ml  Output 1050 ml  Net -576.42 ml   Filed Weights   09/12/23 1902 09/13/23 0500 09/14/23 0500  Weight: 88.1 kg 91.3 kg 88.2 kg   Physical Examination: Chronically ill Lungs clear BIPAP good seal Moves to command RASS -1 Ext warm  Echo normal  Resolved Hospital Problem List:    Assessment & Plan:  Acute hypoxemic respiratory failure and acute on chronic hypercarbic respiratory failure, suspect polypharmacy + untreated OHS RSV+ Shock state after intubation- weaning now Chronic respiratory failure on HOT 3L Paroxysmal Afib HFpEF HTN HLD History of prior cardiac arrest T2DM Bipolar disorder Ongoing intermittent encephalopathy- overall improving,  suspect CO2 retention will  - Trial of mandatory at bedtime BIPAP, [place] during day - Cleared for mechanical soft, will restart home meds carefully - Keep ICU one more day  Myrla Halsted MD PCCM

## 2023-09-14 NOTE — Progress Notes (Signed)
   09/14/23 0058  BiPAP/CPAP/SIPAP  $ Non-Invasive Ventilator  Non-Invasive Vent Initial  $ Face Mask Medium Yes  BiPAP/CPAP/SIPAP Pt Type Adult  BiPAP/CPAP/SIPAP SERVO  Mask Type Full face mask  Mask Size Medium  Pressure Support 16 cmH20  PEEP 19 cmH20  FiO2 (%) 65 %  Minute Ventilation 10.5  Leak 20  Peak Inspiratory Pressure (PIP) 26  Tidal Volume (Vt) 386  Patient Home Equipment No  Auto Titrate No  Press High Alarm 30 cmH2O  Nasal massage performed No (comment)  CPAP/SIPAP surface wiped down Yes  BiPAP/CPAP /SiPAP Vitals  SpO2 95 %  Bilateral Breath Sounds Diminished

## 2023-09-14 NOTE — Progress Notes (Signed)
PT Cancellation Note  Patient Details Name: Becky Hendricks MRN: [id number] DOB: February 28, 1946   Cancelled Treatment:    Reason Eval/Treat Not Completed: Patient's level of consciousness  Unable to wake. On bipap, RN reports planning to wean this morning.  Will check back this afternoon for formal PT evaluation.   Kathlyn Sacramento, PT, DPT [facility]  Rehabilitation Services Physical Therapist Office: [phone] Website: [email or web address]  Berton Mount 09/14/2023, 9:33 AM

## 2023-09-14 NOTE — Evaluation (Signed)
Physical Therapy Evaluation Patient Details Name: Becky Hendricks MRN: [id number] DOB: 17-Dec-1945 Today's Date: 09/14/2023  History of Present Illness  78 y.o female presented 1/30  after being found unresponsive in her wheelchair at her facility with SpO2 75%. 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  Clinical Impression  Pt admitted with above diagnosis. Limited history with questionable accuracy but pt alludes to receiving PT services 1x/week at [facility] where she is a long term resident. States she uses a standing frame type device for transfers and is able to self propel her wheelchair. Appears to have chronic Rt hand and ankle contractures. Required +2 max assist for bed mobility but progressed to sitting EOB for majority of session at a CGA level. Mod assist +2 for sit to stand and lateral scoots along bed with bil knee block based on reported hx. May benefit from post-acute rehab to continue facilitating independence and reduced burden of care at living facility if she has declined from baseline. SpO2 96% on 8L supplemental O2. BP 106/76, HR 86. Pt currently with functional limitations due to the deficits listed below (see PT Problem List). Pt will benefit from acute skilled PT to increase their independence and safety with mobility to allow discharge.           If plan is discharge home, recommend the following: Two people to help with walking and/or transfers;Two people to help with bathing/dressing/bathroom;Assist for transportation;Assistance with cooking/housework;Supervision due to cognitive status   Can travel by private vehicle   No    Equipment Recommendations None recommended by PT  Recommendations for Other Services       Functional Status Assessment Patient has  had a recent decline in their functional status and demonstrates the ability to make significant improvements in function in a reasonable and predictable amount of time.     Precautions / Restrictions Precautions Precautions: Fall Restrictions Weight Bearing Restrictions Per Provider Order: No      Mobility  Bed Mobility Overal bed mobility: Needs Assistance Bed Mobility: Supine to Sit, Sit to Supine     Supine to sit: Max assist, +2 for safety/equipment, +2 for physical assistance, HOB elevated Sit to supine: Max assist, +2 for safety/equipment, +2 for physical assistance   General bed mobility comments: Pt assisted by facilitating LEs to off bed, reaching with LUE to hold therapist hand. Required Max A +2 to rise and lower back to bed, used bed pad with helicopter technique to reduce friction.    Transfers Overall transfer level: Needs assistance Equipment used: None, 2 person hand held assist Transfers: Sit to/from Stand Sit to Stand: +2 physical assistance, Mod assist           General transfer comment: Mod assist +2 with bil knee block for boost to stand. Responds well to cues for anterior weight shift prior to rising. Performed x3 and able to scoot laterally along bed.    Ambulation/Gait                  Stairs            Wheelchair Mobility     Tilt Bed    Modified Rankin (Stroke Patients Only)       Balance Overall balance assessment: Needs assistance Sitting-balance support: Bilateral upper extremity supported, Feet supported Sitting balance-Leahy Scale: Poor Sitting balance -  Comments: Progressed from mod to CGA after sitting EOB working on seated balance. Postural control: Posterior lean                                   Pertinent Vitals/Pain Pain Assessment Pain Assessment: No/denies pain    Home Living Family/patient expects to be discharged to:: Skilled nursing facility                   Additional  Comments: patient has been in LTC for several years    Prior Function Prior Level of Function : Needs assist;Patient poor historian/Family not available             Mobility Comments: Limited history provided due to cognition but states she uses a "standing lift" not a hoyer to transfer to w/c with staff. States she is able propel her self with W/c. Has worked with PT at [facility], unsure of if she is working with them currently but seems to allude that they treat her once per week. ADLs Comments: Does not describe     Extremity/Trunk Assessment   Upper Extremity Assessment Upper Extremity Assessment: Defer to OT evaluation (contracture of Rt hand but able to volitionally open about 50%.)    Lower Extremity Assessment Lower Extremity Assessment: Generalized weakness;Difficult to assess due to impaired cognition (bil ankle contractures R>L)       Communication   Communication Communication: No apparent difficulties  Cognition Arousal: Alert Behavior During Therapy: WFL for tasks assessed/performed Overall Cognitive Status: No family/caregiver present to determine baseline cognitive functioning                                 General Comments: Oriented to place, knows she was found unresponsive in w/c but unsure of other details for admission.        General Comments General comments (skin integrity, edema, etc.): On 8L supplemental O2 throughout session: SpO2 96%, HR 86, BP 106/76, RR 26.    Exercises     Assessment/Plan    PT Assessment Patient needs continued PT services  PT Problem List Decreased strength;Decreased range of motion;Decreased balance;Decreased activity tolerance;Decreased mobility;Decreased knowledge of use of DME;Decreased skin integrity;Obesity;Impaired tone;Cardiopulmonary status limiting activity;Decreased cognition       PT Treatment Interventions DME instruction;Functional mobility training;Therapeutic activities;Therapeutic  exercise;Balance training;Neuromuscular re-education;Cognitive remediation;Patient/family education;Wheelchair mobility training;Modalities    PT Goals (Current goals can be found in the Care Plan section)  Acute Rehab PT Goals Patient Stated Goal: Continue therapy at [facility] PT Goal Formulation: With patient Time For Goal Achievement: 09/28/23 Potential to Achieve Goals: Fair    Frequency Min 1X/week     Co-evaluation               AM-PAC PT "6 Clicks" Mobility  Outcome Measure Help needed turning from your back to your side while in a flat bed without using bedrails?: Total Help needed moving from lying on your back to sitting on the side of a flat bed without using bedrails?: Total Help needed moving to and from a bed to a chair (including a wheelchair)?: Total Help needed standing up from a chair using your arms (e.g., wheelchair or bedside chair)?: Total Help needed to walk in hospital room?: Total Help needed climbing 3-5 steps with a railing? : Total 6 Click Score: 6    End of Session Equipment  Utilized During Treatment: Oxygen;Gait belt Activity Tolerance: Patient tolerated treatment well Patient left: in bed;with call bell/phone within reach;with bed alarm set Nurse Communication: Mobility status PT Visit Diagnosis: Muscle weakness (generalized) (M62.81);Difficulty in walking, not elsewhere classified (R26.2);Other symptoms and signs involving the nervous system (R29.898)    Time: 1247-1310 PT Time Calculation (min) (ACUTE ONLY): 23 min   Charges:   PT Evaluation $PT Eval Moderate Complexity: 1 Mod PT Treatments $Therapeutic Activity: 8-22 mins PT General Charges $$ ACUTE PT VISIT: 1 Visit         Kathlyn Sacramento, PT, DPT [facility]  Rehabilitation Services Physical Therapist Office: [phone] Website: [email or web address]   Berton Mount 09/14/2023, 1:55 PM

## 2023-09-14 NOTE — Evaluation (Signed)
Clinical/Bedside Swallow Evaluation Patient Details  Name: Becky Hendricks MRN: [id number] Date of Birth: 12/02/1945  Today's Date: 09/14/2023 Time: SLP Start Time (ACUTE ONLY): 1224 SLP Stop Time (ACUTE ONLY): 1240 SLP Time Calculation (min) (ACUTE ONLY): 16 min  Past Medical History:  Past Medical History:  Diagnosis Date   Acute on chronic respiratory failure with hypoxia and hypercapnia (HCC)    Acute respiratory failure (HCC) 03/29/2021   Atrial fibrillation (HCC)    Bipolar disorder (HCC) 04/27/2020   Cardiac arrest (HCC)    Clostridium difficile diarrhea 08/02/2019   Colitis 03/29/2021   Dehydration    Drug rash    Encephalopathy acute 11/18/2020   Essential hypertension    Fall 02/11/2020   Hypoxia    Morbid obesity (HCC)    Nausea and vomiting 11/17/2020   Osteomyelitis (HCC) 12/03/2019   Pneumonia due to COVID-19 virus 09/15/2019   Pneumonia of lower lobe due to infectious organism 03/29/2021   Severe sepsis (HCC) 12/03/2019   SIRS (systemic inflammatory response syndrome) (HCC) 11/17/2020   Type 2 diabetes mellitus (HCC)    Urinary tract infection without hematuria 09/15/2019   Weakness    Past Surgical History:  Past Surgical History:  Procedure Laterality Date   INCISION AND DRAINAGE PERIRECTAL ABSCESS Left 12/04/2019   Procedure: IRRIGATION AND DEBRIDEMENT LEFT BUTTOCK ABSCESS;  Surgeon: Violeta Gelinas, MD;  Location: [facility] OR;  Service: General;  Laterality: Left;   INCISION AND DRAINAGE PERIRECTAL ABSCESS Left 12/10/2019   Procedure: REPEAT IRRIGATION AND DEBRIDEMENT BUTTOCK  ABSCESS;  Surgeon: Abigail Miyamoto, MD;  Location: [facility] OR;  Service: General;  Laterality: Left;   IR FLUORO GUIDE CV LINE RIGHT  11/25/2020   IR RADIOLOGIST EVAL & MGMT  12/29/2020   IR US GUIDE VASC ACCESS RIGHT  11/25/2020   HPI:  78 year old woman who presented to [facility] ED 1/30 after she was found unresponsive in her wheelchair at her facility with reported SpO2 75%. PMHx significant for  HTN, HLD, PAF, prior cardiac arrest, AoC respiratory failure with hypoxia/hypercapnia (on 3L HOT), T2DM, osteomyelitis, C. Diff, bipolar disorder, functional quadriplegia. Recent presentation to [facility] ED 07/19/2023 for episode of unresponsiveness. Admitted with COVID 03/2023. Pt with acute hypoxemic respiratory failure suspected from polypharmacy, noted to be RSV positive.    Assessment / Plan / Recommendation  Clinical Impression  Pt presents with a suspected mild oropharyngeal dysphagia, likely from deconditioning and short bout of mechanical ventiliation s/p extubation 1/31. Pt eager for POs, oral hygiene was intact with natural dentition noted. Pt consumed ice chips and thin liquids via cup and consecutive straw sip. Delayed coughing noted in 3/7 trials with thins. Vocal quality remained clear, swallow palpation appeared brisk and with good laryngeal elevation. Will inititate dysphagia 3 (mechanical soft) and thin liquids with meds whole in puree and safe swallow precautions. Will closely monitor for diet tolerance/ or any future need for instrumental swallow assessment.  SLP Visit Diagnosis: Dysphagia, oral phase (R13.11);Dysphagia, unspecified (R13.10)    Aspiration Risk  Mild aspiration risk;Moderate aspiration risk    Diet Recommendation   Thin;Dysphagia 3 (mechanical soft)  Medication Administration: Whole meds with puree    Other  Recommendations Oral Care Recommendations: Oral care BID    Recommendations for follow up therapy are one component of a multi-disciplinary discharge planning process, led by the attending physician.  Recommendations may be updated based on patient status, additional functional criteria and insurance authorization.  Follow up Recommendations Follow physician's recommendations for discharge plan and follow up therapies  Assistance Recommended at Discharge    Functional Status Assessment    Frequency and Duration min 2x/week  2 weeks       Prognosis  Prognosis for improved oropharyngeal function: Fair Barriers to Reach Goals: Time post onset      Swallow Study   General Date of Onset: 09/12/23 HPI: 78 year old woman who presented to [facility] ED 1/30 after she was found unresponsive in her wheelchair at her facility with reported SpO2 75%. PMHx significant for HTN, HLD, PAF, prior cardiac arrest, AoC respiratory failure with hypoxia/hypercapnia (on 3L HOT), T2DM, osteomyelitis, C. Diff, bipolar disorder, functional quadriplegia. Recent presentation to [facility] ED 07/19/2023 for episode of unresponsiveness. Admitted with COVID 03/2023. Pt with acute hypoxemic respiratory failure suspected from polypharmacy, noted to be RSV positive. Type of Study: Bedside Swallow Evaluation Previous Swallow Assessment: 2022 dysphagia intervention Diet Prior to this Study: NPO Temperature Spikes Noted: No Respiratory Status: Nasal cannula History of Recent Intubation: Yes Total duration of intubation (days): 1 days Date extubated: 09/13/23 Behavior/Cognition: Alert;Cooperative Oral Care Completed by SLP: Yes Oral Cavity - Dentition: Adequate natural dentition Vision: Functional for self-feeding Self-Feeding Abilities: Needs set up Patient Positioning: Upright in bed Baseline Vocal Quality: Low vocal intensity Volitional Cough: Congested Volitional Swallow: Able to elicit    Oral/Motor/Sensory Function Overall Oral Motor/Sensory Function: Generalized oral weakness   Ice Chips Ice chips: Within functional limits Presentation: Spoon   Thin Liquid Thin Liquid: Impaired Presentation: Cup;Straw Pharyngeal  Phase Impairments: Suspected delayed Swallow;Multiple swallows;Cough - Delayed;Throat Clearing - Delayed    Nectar Thick Nectar Thick Liquid: Not tested   Honey Thick Honey Thick Liquid: Not tested   Puree Puree: Within functional limits Presentation: Spoon   Solid     Solid: Impaired Presentation: Self Fed Oral Phase Functional Implications: Prolonged  oral transit Pharyngeal Phase Impairments: Suspected delayed Swallow;Multiple swallows      Harjit Leider H. MA, CCC-SLP Acute Rehabilitation Services   09/14/2023,12:50 PM

## 2023-09-14 NOTE — Plan of Care (Signed)
At 7p, notified of worsening hypoxia and work of breathing, altered mentation. On arrival, patient is on 15LNC and NRB with O2 sat 86%. Decision made to emergently intubate. Per bedside nursing, possible aspiration event earlier during meal. Willl hold on Unasyn for now. Repeat ABG in 1hr.

## 2023-09-14 NOTE — Progress Notes (Signed)
SLP Cancellation Note  Patient Details Name: Becky Hendricks MRN: [id number] DOB: 05/11/1946   Cancelled treatment:       Reason Eval/Treat Not Completed: Medical issues which prohibited therapy (Pt on bipap) RN to coordinate with SLP this date to reattempt clinical swallow evaluation if pt able to come off Bipap.  Ardyth Gal MA, CCC-SLP Acute Rehabilitation Services   09/14/2023, 8:40 AM

## 2023-09-14 NOTE — Progress Notes (Signed)
RT obtained ABG, results did not cross over.  ABG results as follows: 7.43/48/124/32.3 on bipap. No new orders for RT at this time. RT will continue to monitor.

## 2023-09-15 DIAGNOSIS — J9601 Acute respiratory failure with hypoxia: Secondary | ICD-10-CM | POA: Diagnosis not present

## 2023-09-15 LAB — POCT I-STAT 7, (LYTES, BLD GAS, ICA,H+H)
Acid-Base Excess: 6 mmol/L — ABNORMAL HIGH (ref 0.0–2.0)
Bicarbonate: 30.4 mmol/L — ABNORMAL HIGH (ref 20.0–28.0)
Calcium, Ion: 1.16 mmol/L (ref 1.15–1.40)
HCT: 32 % — ABNORMAL LOW (ref 36.0–46.0)
Hemoglobin: 10.9 g/dL — ABNORMAL LOW (ref 12.0–15.0)
O2 Saturation: 100 %
Patient temperature: 99.7
Potassium: 3.8 mmol/L (ref 3.5–5.1)
Sodium: 138 mmol/L (ref 135–145)
TCO2: 32 mmol/L (ref 22–32)
pCO2 arterial: 44.2 mm[Hg] (ref 32–48)
pH, Arterial: 7.448 (ref 7.35–7.45)
pO2, Arterial: 166 mm[Hg] — ABNORMAL HIGH (ref 83–108)

## 2023-09-15 LAB — CBC
HCT: 36.8 % (ref 36.0–46.0)
Hemoglobin: 11.6 g/dL — ABNORMAL LOW (ref 12.0–15.0)
MCH: 30.4 pg (ref 26.0–34.0)
MCHC: 31.5 g/dL (ref 30.0–36.0)
MCV: 96.6 fL (ref 80.0–100.0)
Platelets: 154 10*3/uL (ref 150–400)
RBC: 3.81 MIL/uL — ABNORMAL LOW (ref 3.87–5.11)
RDW: 14.2 % (ref 11.5–15.5)
WBC: 13 10*3/uL — ABNORMAL HIGH (ref 4.0–10.5)
nRBC: 0 % (ref 0.0–0.2)

## 2023-09-15 LAB — GLUCOSE, CAPILLARY
Glucose-Capillary: 125 mg/dL — ABNORMAL HIGH (ref 70–99)
Glucose-Capillary: 138 mg/dL — ABNORMAL HIGH (ref 70–99)
Glucose-Capillary: 115 mg/dL — ABNORMAL HIGH (ref 70–99)
Glucose-Capillary: 176 mg/dL — ABNORMAL HIGH (ref 70–99)
Glucose-Capillary: 160 mg/dL — ABNORMAL HIGH (ref 70–99)
Glucose-Capillary: 222 mg/dL — ABNORMAL HIGH (ref 70–99)

## 2023-09-15 MED ORDER — PIVOT 1.5 CAL PO LIQD
1000.0000 mL | ORAL | Status: DC
  Administered 2023-09-15: 1000 mL
  Filled 2023-09-15 (×2): qty 1000

## 2023-09-15 MED ORDER — SORBITOL 70 % SOLN
60.0000 mL | Freq: Two times a day (BID) | Status: DC
  Administered 2023-09-15 – 2023-09-16 (×4): 60 mL
  Filled 2023-09-15 (×5): qty 60

## 2023-09-15 MED ORDER — VALPROIC ACID 250 MG/5ML PO SOLN
500.0000 mg | Freq: Two times a day (BID) | ORAL | Status: DC
  Administered 2023-09-15 – 2023-09-23 (×17): 500 mg
  Filled 2023-09-15 (×19): qty 10

## 2023-09-15 MED ORDER — RISPERIDONE 3 MG PO TABS
3.0000 mg | ORAL_TABLET | Freq: Every day | ORAL | Status: DC
  Administered 2023-09-15 – 2023-09-23 (×9): 3 mg
  Filled 2023-09-15 (×10): qty 1

## 2023-09-15 MED ORDER — ATORVASTATIN CALCIUM 10 MG PO TABS
10.0000 mg | ORAL_TABLET | Freq: Every day | ORAL | Status: DC
  Administered 2023-09-15 – 2023-09-23 (×9): 10 mg
  Filled 2023-09-15 (×9): qty 1

## 2023-09-15 MED ORDER — ENOXAPARIN SODIUM 80 MG/0.8ML IJ SOSY
80.0000 mg | PREFILLED_SYRINGE | Freq: Two times a day (BID) | INTRAMUSCULAR | Status: DC
  Administered 2023-09-15 – 2023-09-17 (×5): 80 mg via SUBCUTANEOUS
  Filled 2023-09-15 (×5): qty 0.8

## 2023-09-15 MED ORDER — DOCUSATE SODIUM 50 MG/5ML PO LIQD
100.0000 mg | Freq: Two times a day (BID) | ORAL | Status: DC
  Administered 2023-09-15 – 2023-09-16 (×3): 100 mg
  Filled 2023-09-15 (×3): qty 10

## 2023-09-15 MED ORDER — PROSOURCE TF20 ENFIT COMPATIBL EN LIQD
60.0000 mL | Freq: Every day | ENTERAL | Status: DC
  Administered 2023-09-15 – 2023-09-23 (×8): 60 mL
  Filled 2023-09-15 (×10): qty 60

## 2023-09-15 MED ORDER — POLYETHYLENE GLYCOL 3350 17 G PO PACK
17.0000 g | PACK | Freq: Every day | ORAL | Status: DC
  Administered 2023-09-16: 17 g
  Filled 2023-09-15: qty 1

## 2023-09-15 NOTE — Progress Notes (Signed)
NAME:  Becky Hendricks, MRN:  [id number], DOB:  06/12/1946, LOS: 3 ADMISSION DATE:  09/12/2023 CONSULTATION DATE:  09/12/2023 REFERRING MD:  Silverio Lay - EDP, CHIEF COMPLAINT:  Unresponsiveness   History of Present Illness:  78 year old woman who presented to [facility] ED 1/30 after she was found unresponsive in her wheelchair at her facility with reported SpO2 75%. PMHx significant for HTN, HLD, PAF, prior cardiac arrest, AoC respiratory failure with hypoxia/hypercapnia (on 3L HOT), T2DM, osteomyelitis, C. Diff, bipolar disorder, functional quadriplegia. Recent presentation to [facility] ED 07/19/2023 for episode of unresponsiveness. Admitted with COVID 03/2023.  History is obtained primarily from chart review. Per EMS, patient was found unresponsive in her wheelchair at her facility with SpO2 75%. Placed on NRB and intubated on ED arrival. Labs were notable for WBC 11.3, Hgb 13.5, Plt 145. Na 141, K 4.8, CO2 32, Cr 0.63 (baseline), LFTs WNL. VBG 7.344/pCO2 65.9/bicarb 35.9. LA 0.8. VPA level WNL. Post-intubation ABG 7.384/pCO2 53.6/pO2 293/bicarb 32. CXR with low lung volumes, no active disease. AXR nonobstructive bowel gas pattern. CT Head NAICA.  PCCM consulted for ICU admission.  Pertinent Medical History:   Past Medical History:  Diagnosis Date   Acute on chronic respiratory failure with hypoxia and hypercapnia (HCC)    Acute respiratory failure (HCC) 03/29/2021   Atrial fibrillation (HCC)    Bipolar disorder (HCC) 04/27/2020   Cardiac arrest (HCC)    Clostridium difficile diarrhea 08/02/2019   Colitis 03/29/2021   Dehydration    Drug rash    Encephalopathy acute 11/18/2020   Essential hypertension    Fall 02/11/2020   Hypoxia    Morbid obesity (HCC)    Nausea and vomiting 11/17/2020   Osteomyelitis (HCC) 12/03/2019   Pneumonia due to COVID-19 virus 09/15/2019   Pneumonia of lower lobe due to infectious organism 03/29/2021   Severe sepsis (HCC) 12/03/2019   SIRS (systemic inflammatory response  syndrome) (HCC) 11/17/2020   Type 2 diabetes mellitus (HCC)    Urinary tract infection without hematuria 09/15/2019   Weakness    Significant Hospital Events: Including procedures, antibiotic start and stop dates in addition to other pertinent events   1/30 - Presented from facility after being found unresponsive in wheelchair hypoxic to 75%. Intubated in ED. 2/1 reintubated after aspiration event from dinner  Interim History / Subjective:  Had a rough time after dinner becoming hypoxemic and less responsive with what sounds like witnessed aspiration.  Objective:  Blood pressure (!) 104/54, pulse (!) 55, temperature 99.7 F (37.6 C), temperature source Oral, resp. rate (!) 25, height 5\' 6"  (1.676 m), weight 87.7 kg, SpO2 100%.    Vent Mode: PRVC FiO2 (%):  [50 %-100 %] 50 % Set Rate:  [28 bmp] 28 bmp Vt Set:  [400 mL] 400 mL PEEP:  [8 cmH20-10 cmH20] 8 cmH20 Plateau Pressure:  [20 cmH20-31 cmH20] 20 cmH20   Intake/Output Summary (Last 24 hours) at 09/15/2023 4098 Last data filed at 09/15/2023 0600 Gross per 24 hour  Intake 1466.98 ml  Output 1050 ml  Net 416.98 ml   Filed Weights   09/13/23 0500 09/14/23 0500 09/15/23 0438  Weight: 91.3 kg 88.2 kg 87.7 kg   Physical Examination: On vent but will wake up and follow commands Rhonci bilaterally, small secretions Ext warm No rashes Abd soft hypoactive BS  CBG ok  Resolved Hospital Problem List:    Assessment & Plan:  Acute hypoxemic respiratory failure and acute on chronic hypercarbic respiratory failure, suspect polypharmacy + untreated OHS + now  seems like recurrent aspiration RSV+ Shock state after intubation- resolved Chronic respiratory failure on HOT 3L Paroxysmal Afib HFpEF HTN HLD History of prior cardiac arrest T2DM Bipolar disorder Ongoing intermittent encephalopathy- overall improving, suspect CO2 retention will  - Check tracheal aspirate, trend WBC/fever curve, low threshold for abx - Continue vent  support - Hold eliquis, start lovenox - See IPAL discussion - Start TF  33 min cc time  Myrla Halsted MD PCCM

## 2023-09-15 NOTE — Progress Notes (Signed)
SLP Cancellation Note  Patient Details Name: Becky Hendricks MRN: [id number] DOB: 09-21-1945   Cancelled treatment:       Reason Eval/Treat Not Completed: Medical issues which prohibited therapy.  Pt was reintubated yesterday evening (2/1) and remains on the vent this morning.  SLP will f/u as appropriate.    Shanon Rosser Abrianna Sidman 09/15/2023, 8:54 AM

## 2023-09-15 NOTE — IPAL (Signed)
  Interdisciplinary Goals of Care Family Meeting   Date carried out: 09/15/2023  Location of the meeting: Phone conference  Member's involved: Physician and Family Member or next of kin  Durable Power of Attorney or acting medical decision maker: Brother    Discussion: We discussed goals of care for [employer] .    Discussed patient was baseline independent until Dec 2020.  Dec 2020: fall in [place].  Found down by family ~48 hours down, dehydration, caught COVID in rehab.    Since that time bouncing back between rehab and hospital.  Each time getting a little weaker.  However, each time she gets out she is happy that everything was done and she wants to extend life at all costs as baseline QoL is sitting in chair and watching TV.  Discussed very likely she is heading toward a trach/PEG  Code status:   Code Status: Full Code   Disposition: Continue current acute care  Time spent for the meeting: 14 mins    Lorin Glass, MD  09/15/2023, 10:18 AM

## 2023-09-15 NOTE — Progress Notes (Signed)
Brief Nutrition Note  Consult received for enteral/tube feeding initiation and management.  Adult Enteral Nutrition Protocol initiated. Full assessment to follow.  OG Vented/dual Lumen 16 Fr.  tube in place with tip located in abdomen per xray imaging.   Admitting Dx: Acute hypoxemic respiratory failure (HCC) [J96.01]  Body mass index is 31.21 kg/m.  Labs:  Recent Labs  Lab 09/12/23 1601 09/12/23 1609 09/12/23 1704 09/13/23 0303 09/14/23 0401 09/14/23 0744 09/14/23 2012 09/15/23 0829  NA 141 139  139   < > 138 139 134* 135 138  K 4.8 4.7  4.7   < > 4.1 3.8 3.8 4.6 3.8  CL 98 98  --  103 100  --   --   --   CO2 32  --   --  26 25  --   --   --   BUN 16 20  --  19 13  --   --   --   CREATININE 0.63 0.70  --  0.60 0.75  --   --   --   CALCIUM 9.2  --   --  8.3* 8.2*  --   --   --   MG  --   --   --  1.7 2.1  --   --   --   PHOS  --   --   --  2.6  --   --   --   --   GLUCOSE 142* 142*  --  124* 123*  --   --   --    < > = values in this interval not displayed.    Jamelle Haring RDN, LDN Clinical Dietitian   If unable to reach, please contact "[facility] Inpatient" secure chat group between 8 am-4 pm daily"

## 2023-09-16 ENCOUNTER — Inpatient Hospital Stay (HOSPITAL_COMMUNITY): Payer: Medicare Other

## 2023-09-16 DIAGNOSIS — R569 Unspecified convulsions: Secondary | ICD-10-CM

## 2023-09-16 DIAGNOSIS — J9622 Acute and chronic respiratory failure with hypercapnia: Secondary | ICD-10-CM | POA: Diagnosis not present

## 2023-09-16 DIAGNOSIS — G9340 Encephalopathy, unspecified: Secondary | ICD-10-CM

## 2023-09-16 DIAGNOSIS — R4182 Altered mental status, unspecified: Secondary | ICD-10-CM

## 2023-09-16 DIAGNOSIS — J9601 Acute respiratory failure with hypoxia: Secondary | ICD-10-CM | POA: Diagnosis not present

## 2023-09-16 LAB — CBC
HCT: 33.8 % — ABNORMAL LOW (ref 36.0–46.0)
Hemoglobin: 10.5 g/dL — ABNORMAL LOW (ref 12.0–15.0)
MCH: 30.1 pg (ref 26.0–34.0)
MCHC: 31.1 g/dL (ref 30.0–36.0)
MCV: 96.8 fL (ref 80.0–100.0)
Platelets: 163 10*3/uL (ref 150–400)
RBC: 3.49 MIL/uL — ABNORMAL LOW (ref 3.87–5.11)
RDW: 14.6 % (ref 11.5–15.5)
WBC: 8.2 10*3/uL (ref 4.0–10.5)
nRBC: 0 % (ref 0.0–0.2)

## 2023-09-16 LAB — BASIC METABOLIC PANEL
Anion gap: 9 (ref 5–15)
BUN: 25 mg/dL — ABNORMAL HIGH (ref 8–23)
CO2: 27 mmol/L (ref 22–32)
Calcium: 8.3 mg/dL — ABNORMAL LOW (ref 8.9–10.3)
Chloride: 105 mmol/L (ref 98–111)
Creatinine, Ser: 0.57 mg/dL (ref 0.44–1.00)
GFR, Estimated: >60 mL/min (ref >60)
Glucose, Bld: 178 mg/dL — ABNORMAL HIGH (ref 70–99)
Potassium: 3.8 mmol/L (ref 3.5–5.1)
Sodium: 141 mmol/L (ref 135–145)

## 2023-09-16 LAB — GLUCOSE, CAPILLARY
Glucose-Capillary: 162 mg/dL — ABNORMAL HIGH (ref 70–99)
Glucose-Capillary: 164 mg/dL — ABNORMAL HIGH (ref 70–99)
Glucose-Capillary: 231 mg/dL — ABNORMAL HIGH (ref 70–99)
Glucose-Capillary: 151 mg/dL — ABNORMAL HIGH (ref 70–99)
Glucose-Capillary: 158 mg/dL — ABNORMAL HIGH (ref 70–99)
Glucose-Capillary: 202 mg/dL — ABNORMAL HIGH (ref 70–99)

## 2023-09-16 LAB — MAGNESIUM: Magnesium: 2.2 mg/dL (ref 1.7–2.4)

## 2023-09-16 LAB — PHOSPHORUS: Phosphorus: 1.7 mg/dL — ABNORMAL LOW (ref 2.5–4.6)

## 2023-09-16 LAB — TRIGLYCERIDES: Triglycerides: 98 mg/dL (ref <150)

## 2023-09-16 MED ORDER — JUVEN PO PACK
1.0000 | PACK | Freq: Two times a day (BID) | ORAL | Status: DC
  Administered 2023-09-16 – 2023-09-24 (×14): 1
  Filled 2023-09-16 (×16): qty 1

## 2023-09-16 MED ORDER — POTASSIUM & SODIUM PHOSPHATES 280-160-250 MG PO PACK
2.0000 | PACK | ORAL | Status: AC
  Administered 2023-09-16 (×4): 2
  Filled 2023-09-16 (×4): qty 2

## 2023-09-16 MED ORDER — OSMOLITE 1.5 CAL PO LIQD
1000.0000 mL | ORAL | Status: DC
  Administered 2023-09-16 – 2023-09-24 (×8): 1000 mL
  Filled 2023-09-16 (×2): qty 1000

## 2023-09-16 NOTE — Procedures (Signed)
Patient Name: Becky Hendricks  MRN: [id number]  Epilepsy Attending: Charlsie Quest  Referring Physician/Provider: Lorin Glass, MD  Date: 09/16/2023 Duration: 23.38 mins  Patient history: 78yo F with ams. EEG to evaluate for seizure  Level of alertness: Awake  AEDs during EEG study: VPA  Technical aspects: This EEG study was done with scalp electrodes positioned according to the 10-20 International system of electrode placement. Electrical activity was reviewed with band pass filter of 1-70Hz , sensitivity of 7 uV/mm, display speed of 39mm/sec with a 60Hz  notched filter applied as appropriate. EEG data were recorded continuously and digitally stored.  Video monitoring was available and reviewed as appropriate.  Description: EEG showed continuous generalized polymorphic sharply contoured 3 to 6 Hz theta-delta slowing, at times with triphasic morphology. Hyperventilation and photic stimulation were not performed.     ABNORMALITY - Continuous slow, generalized  IMPRESSION: This study is suggestive of moderate diffuse encephalopathy. No seizures or epileptiform discharges were seen throughout the recording.  Becky Hendricks

## 2023-09-16 NOTE — Progress Notes (Signed)
STAT EEG complete - results pending. ? ?

## 2023-09-16 NOTE — Progress Notes (Signed)
Nutrition Follow-up  DOCUMENTATION CODES:   Not applicable  INTERVENTION:  TF via OGT: Transition to Osmolite 1.5 at 26ml/h and advance by 10ml q10h to goal rate of 10ml/hr ( per day) 60ml Prosource TF20 once daily Provides 1880 kcal, 95g protein and free water flushes   Juven BID to support wound healing  NUTRITION DIAGNOSIS:   Inadequate oral intake related to acute illness as evidenced by NPO status.  GOAL:   Patient will meet greater than or equal to 90% of their needs  MONITOR:   Vent status, Labs, Weight trends, TF tolerance, Skin  REASON FOR ASSESSMENT:   Consult Enteral/tube feeding initiation and management (trickle start.)  ASSESSMENT:   Pt admitted from SNF after being found unresponsive in her wheelchair. PMH significant for HTN, HLD, PAF, cardiac arrest, AoC respiratory failure with hypoxia/hypercapnia, DM2, osteomyelitis, c diff, functional quadriplegia. Recently presented to [facility] ED 07/19/23 for episode of unresponsiveness. Admitted 03/2023 with COVID.  1/30: admitted; intubated 1/31: extubated 2/1: re-intubated after aspiration event from dinner  Patient is currently intubated on ventilator support MV: 11.6 L/min Temp (24hrs), Avg:99 F (37.2 C), Min:98.1 F (36.7 C), Max:99.9 F (37.7 C)  Seen by SLP prior to re-intubation with recommendation for dysphagia 3, thin liquids. Documentation reflects pt consumed 75% lunch and 90% dinner on 2/1 prior to re-intubation.   IPAL- noted pt likely headed toward trach/PEG  Pt started on TF protocol yesterday. Appears to be tolerating TF well at this time.  No abdominal distension, having BM's per RN.  Discussed with RN plan to transition to different tube feed formulary.   Admit weight: 88.1 kg Current weight: 89.2 kg + edema: mild pitting generalized, BLE  Per review of chart, pt's weight has declined 12.7% which is concerning but not clinically significant for time frame. Question where some  weight change is related to fluid changes.   Pt noted to be primarily wheelchair bound and functional quad at baseline. Overall appears to be fairly well nourished.   Drains/lines: UOP: x24 hours OGT (tip distal stomach)  Medications: colace BID, pepcid, SSI 0-9 units q4h, miralax daily, sorbitol  Labs:   BUN 25 Phos 1.7 (repletion ordered) CBG's 115-222 x24 hours  NUTRITION - FOCUSED PHYSICAL EXAM: Flowsheet Row Most Recent Value  Orbital Region Moderate depletion  Upper Arm Region No depletion  Thoracic and Lumbar Region No depletion  Buccal Region Unable to assess  Temple Region No depletion  Clavicle Bone Region No depletion  Clavicle and Acromion Bone Region No depletion  Scapular Bone Region Mild depletion  Dorsal Hand Unable to assess  [R hand mod edema]  Patellar Region Moderate depletion  Anterior Thigh Region Moderate depletion  Posterior Calf Region No depletion  Edema (RD Assessment) Mild  [BLE,  R>L]  Hair Reviewed  Eyes Unable to assess  Mouth Unable to assess  Skin Reviewed  Nails Reviewed    Diet Order:   Diet Order             Diet NPO time specified  Diet effective now                   EDUCATION NEEDS:  No education needs have been identified at this time  Skin:  Skin Assessment: Skin Integrity Issues: Skin Integrity Issues:: Stage III Stage III: sacrum  Last BM:  2/3 type 6 medium  Height:  Ht Readings from Last 1 Encounters:  09/13/23 5\' 6"  (1.676 m)    Weight:  Wt  Readings from Last 1 Encounters:  09/16/23 89.2 kg    Ideal Body Weight:  59.1 kg  BMI:  Body mass index is 31.74 kg/m.  Estimated Nutritional Needs:   Kcal:  1700-1900  Protein:  95-110g  Fluid:  >/=1.7L  Drusilla Kanner, RDN, LDN Clinical Nutrition

## 2023-09-16 NOTE — Progress Notes (Signed)
NAME:  Zariana Strub, MRN:  [id number], DOB:  07/21/1946, LOS: 4 ADMISSION DATE:  09/12/2023 CONSULTATION DATE:  09/12/2023 REFERRING MD:  Silverio Lay - EDP, CHIEF COMPLAINT:  Unresponsiveness   History of Present Illness:  78 year old woman who presented to [facility] ED 1/30 after she was found unresponsive in her wheelchair at her facility with reported SpO2 75%. PMHx significant for HTN, HLD, PAF, prior cardiac arrest, AoC respiratory failure with hypoxia/hypercapnia (on 3L HOT), T2DM, osteomyelitis, C. Diff, bipolar disorder, functional quadriplegia. Recent presentation to [facility] ED 07/19/2023 for episode of unresponsiveness. Admitted with COVID 03/2023.  History is obtained primarily from chart review. Per EMS, patient was found unresponsive in her wheelchair at her facility with SpO2 75%. Placed on NRB and intubated on ED arrival. Labs were notable for WBC 11.3, Hgb 13.5, Plt 145. Na 141, K 4.8, CO2 32, Cr 0.63 (baseline), LFTs WNL. VBG 7.344/pCO2 65.9/bicarb 35.9. LA 0.8. VPA level WNL. Post-intubation ABG 7.384/pCO2 53.6/pO2 293/bicarb 32. CXR with low lung volumes, no active disease. AXR nonobstructive bowel gas pattern. CT Head NAICA.  PCCM consulted for ICU admission.  Pertinent Medical History:   Past Medical History:  Diagnosis Date   Acute on chronic respiratory failure with hypoxia and hypercapnia (HCC)    Acute respiratory failure (HCC) 03/29/2021   Atrial fibrillation (HCC)    Bipolar disorder (HCC) 04/27/2020   Cardiac arrest (HCC)    Clostridium difficile diarrhea 08/02/2019   Colitis 03/29/2021   Dehydration    Drug rash    Encephalopathy acute 11/18/2020   Essential hypertension    Fall 02/11/2020   Hypoxia    Morbid obesity (HCC)    Nausea and vomiting 11/17/2020   Osteomyelitis (HCC) 12/03/2019   Pneumonia due to COVID-19 virus 09/15/2019   Pneumonia of lower lobe due to infectious organism 03/29/2021   Severe sepsis (HCC) 12/03/2019   SIRS (systemic inflammatory response  syndrome) (HCC) 11/17/2020   Type 2 diabetes mellitus (HCC)    Urinary tract infection without hematuria 09/15/2019   Weakness    Significant Hospital Events: Including procedures, antibiotic start and stop dates in addition to other pertinent events   1/30 - Presented from facility after being found unresponsive in wheelchair hypoxic to 75%. Intubated in ED. 2/1 reintubated after aspiration event from dinner  Interim History / Subjective:  Weaning sedation today but less responsive.  Objective:  Blood pressure (!) 110/54, pulse 74, temperature 99.9 F (37.7 C), temperature source Axillary, resp. rate (!) 28, height 5\' 6"  (1.676 m), weight 89.2 kg, SpO2 95%.    Vent Mode: PRVC FiO2 (%):  [40 %-50 %] 40 % Set Rate:  [28 bmp] 28 bmp Vt Set:  [400 mL] 400 mL PEEP:  [8 cmH20] 8 cmH20 Plateau Pressure:  [21 cmH20-24 cmH20] 21 cmH20   Intake/Output Summary (Last 24 hours) at 09/16/2023 0919 Last data filed at 09/16/2023 0900 Gross per 24 hour  Intake 1208.29 ml  Output 410 ml  Net 798.29 ml   Filed Weights   09/14/23 0500 09/15/23 0438 09/16/23 0800  Weight: 88.2 kg 87.7 kg 89.2 kg   Physical Examination: Opens eyes but not tracking Pupils pinpoint Some jerking of lips and hands Ext warm Rhonci improved on vent   BMP pending CBC stable  Assessment & Plan:  Acute hypoxemic respiratory failure and acute on chronic hypercarbic respiratory failure, suspected polypharmacy + untreated OHS + now seems like recurrent aspiration RSV+ Shock state after intubation- resolved Chronic respiratory failure on HOT 3L Paroxysmal Afib HFpEF  HTN HLD History of prior cardiac arrest T2DM Bipolar disorder Ongoing intermittent encephalopathy- suspicion now is this may be from hypoxemia due to aspiration; going to check EEG this am with abnormal cognition - f/u tracheal aspirate - Continue vent support - full dose lovenox - check EEG, consider MRI if does not wake up - See IPAL discussion  2/2 - TF - Likely 1 more extubation trial and if fails, trach -LTACH consult  31 min cc time  Myrla Halsted MD PCCM

## 2023-09-17 ENCOUNTER — Inpatient Hospital Stay (HOSPITAL_COMMUNITY): Payer: Medicare Other

## 2023-09-17 DIAGNOSIS — G9340 Encephalopathy, unspecified: Secondary | ICD-10-CM | POA: Diagnosis not present

## 2023-09-17 DIAGNOSIS — J9601 Acute respiratory failure with hypoxia: Secondary | ICD-10-CM | POA: Diagnosis not present

## 2023-09-17 DIAGNOSIS — J9621 Acute and chronic respiratory failure with hypoxia: Secondary | ICD-10-CM

## 2023-09-17 LAB — GLUCOSE, CAPILLARY
Glucose-Capillary: 167 mg/dL — ABNORMAL HIGH (ref 70–99)
Glucose-Capillary: 171 mg/dL — ABNORMAL HIGH (ref 70–99)
Glucose-Capillary: 182 mg/dL — ABNORMAL HIGH (ref 70–99)
Glucose-Capillary: 173 mg/dL — ABNORMAL HIGH (ref 70–99)
Glucose-Capillary: 191 mg/dL — ABNORMAL HIGH (ref 70–99)
Glucose-Capillary: 198 mg/dL — ABNORMAL HIGH (ref 70–99)
Glucose-Capillary: 183 mg/dL — ABNORMAL HIGH (ref 70–99)

## 2023-09-17 LAB — POCT I-STAT EG7
Acid-Base Excess: 6 mmol/L — ABNORMAL HIGH (ref 0.0–2.0)
Bicarbonate: 29.1 mmol/L — ABNORMAL HIGH (ref 20.0–28.0)
Calcium, Ion: 1.19 mmol/L (ref 1.15–1.40)
HCT: 31 % — ABNORMAL LOW (ref 36.0–46.0)
Hemoglobin: 10.5 g/dL — ABNORMAL LOW (ref 12.0–15.0)
O2 Saturation: 86 %
Patient temperature: 99.5
Potassium: 3.8 mmol/L (ref 3.5–5.1)
Sodium: 146 mmol/L — ABNORMAL HIGH (ref 135–145)
TCO2: 30 mmol/L (ref 22–32)
pCO2, Ven: 38.5 mm[Hg] — ABNORMAL LOW (ref 44–60)
pH, Ven: 7.488 — ABNORMAL HIGH (ref 7.25–7.43)
pO2, Ven: 49 mm[Hg] — ABNORMAL HIGH (ref 32–45)

## 2023-09-17 LAB — BASIC METABOLIC PANEL
Anion gap: 13 (ref 5–15)
BUN: 30 mg/dL — ABNORMAL HIGH (ref 8–23)
CO2: 28 mmol/L (ref 22–32)
Calcium: 8.2 mg/dL — ABNORMAL LOW (ref 8.9–10.3)
Chloride: 107 mmol/L (ref 98–111)
Creatinine, Ser: 0.53 mg/dL (ref 0.44–1.00)
GFR, Estimated: >60 mL/min (ref >60)
Glucose, Bld: 163 mg/dL — ABNORMAL HIGH (ref 70–99)
Potassium: 4.3 mmol/L (ref 3.5–5.1)
Sodium: 148 mmol/L — ABNORMAL HIGH (ref 135–145)

## 2023-09-17 LAB — CULTURE, BLOOD (ROUTINE X 2)
Culture: NO GROWTH
Culture: NO GROWTH
Special Requests: ADEQUATE
Special Requests: ADEQUATE

## 2023-09-17 LAB — MAGNESIUM: Magnesium: 2.4 mg/dL (ref 1.7–2.4)

## 2023-09-17 LAB — CBC
HCT: 30.3 % — ABNORMAL LOW (ref 36.0–46.0)
Hemoglobin: 9.6 g/dL — ABNORMAL LOW (ref 12.0–15.0)
MCH: 31 pg (ref 26.0–34.0)
MCHC: 31.7 g/dL (ref 30.0–36.0)
MCV: 97.7 fL (ref 80.0–100.0)
Platelets: 164 10*3/uL (ref 150–400)
RBC: 3.1 MIL/uL — ABNORMAL LOW (ref 3.87–5.11)
RDW: 15.1 % (ref 11.5–15.5)
WBC: 7.8 10*3/uL (ref 4.0–10.5)
nRBC: 0 % (ref 0.0–0.2)

## 2023-09-17 LAB — PHOSPHORUS: Phosphorus: 3.6 mg/dL (ref 2.5–4.6)

## 2023-09-17 LAB — AMMONIA: Ammonia: 36 umol/L — ABNORMAL HIGH (ref 9–35)

## 2023-09-17 MED ORDER — ORAL CARE MOUTH RINSE
15.0000 mL | OROMUCOSAL | Status: DC
  Administered 2023-09-17 – 2023-09-24 (×84): 15 mL via OROMUCOSAL

## 2023-09-17 MED ORDER — FREE WATER
200.0000 mL | Freq: Three times a day (TID) | Status: DC
  Administered 2023-09-17 – 2023-09-23 (×18): 200 mL

## 2023-09-17 MED ORDER — ROCURONIUM BROMIDE 10 MG/ML (PF) SYRINGE: 100.0000 mg | PREFILLED_SYRINGE | Freq: Once | INTRAVENOUS | Status: DC

## 2023-09-17 MED ORDER — MIDAZOLAM HCL 2 MG/2ML IJ SOLN
5.0000 mg | Freq: Once | INTRAMUSCULAR | Status: DC
  Filled 2023-09-17: qty 6

## 2023-09-17 MED ORDER — ENOXAPARIN SODIUM 80 MG/0.8ML IJ SOSY
80.0000 mg | PREFILLED_SYRINGE | Freq: Two times a day (BID) | INTRAMUSCULAR | Status: DC
  Administered 2023-09-19 – 2023-09-23 (×9): 80 mg via SUBCUTANEOUS
  Filled 2023-09-17 (×9): qty 0.8

## 2023-09-17 MED ORDER — LIDOCAINE-EPINEPHRINE 1 %-1:100000 IJ SOLN
20.0000 mL | Freq: Once | INTRAMUSCULAR | Status: DC
  Filled 2023-09-17: qty 1

## 2023-09-17 MED ORDER — FENTANYL CITRATE PF 50 MCG/ML IJ SOSY: 200.0000 ug | PREFILLED_SYRINGE | Freq: Once | INTRAMUSCULAR | Status: DC

## 2023-09-17 MED ORDER — FENTANYL CITRATE PF 50 MCG/ML IJ SOSY
25.0000 ug | PREFILLED_SYRINGE | INTRAMUSCULAR | Status: DC | PRN
  Administered 2023-09-17 – 2023-09-19 (×7): 50 ug via INTRAVENOUS
  Administered 2023-09-20: 100 ug via INTRAVENOUS
  Administered 2023-09-20 (×2): 50 ug via INTRAVENOUS
  Administered 2023-09-20 – 2023-09-23 (×9): 100 ug via INTRAVENOUS
  Administered 2023-09-23: 50 ug via INTRAVENOUS
  Administered 2023-09-23: 100 ug via INTRAVENOUS
  Administered 2023-09-24: 50 ug via INTRAVENOUS
  Filled 2023-09-17 (×3): qty 2
  Filled 2023-09-17 (×2): qty 1
  Filled 2023-09-17: qty 2
  Filled 2023-09-17: qty 1
  Filled 2023-09-17 (×2): qty 2
  Filled 2023-09-17: qty 1
  Filled 2023-09-17 (×3): qty 2
  Filled 2023-09-17: qty 1
  Filled 2023-09-17: qty 2
  Filled 2023-09-17 (×3): qty 1
  Filled 2023-09-17 (×3): qty 2
  Filled 2023-09-17 (×2): qty 1
  Filled 2023-09-17: qty 2

## 2023-09-17 MED ORDER — LIDOCAINE-EPINEPHRINE 1 %-1:100000 IJ SOLN
20.0000 mL | Freq: Once | INTRAMUSCULAR | Status: DC
Start: 2023-09-17 — End: 2023-09-17

## 2023-09-17 MED ORDER — METOLAZONE 5 MG PO TABS
5.0000 mg | ORAL_TABLET | Freq: Once | ORAL | Status: AC
Start: 2023-09-17 — End: 2023-09-17
  Administered 2023-09-17: 5 mg
  Filled 2023-09-17: qty 1

## 2023-09-17 MED ORDER — MIDAZOLAM HCL 2 MG/2ML IJ SOLN
5.0000 mg | Freq: Once | INTRAMUSCULAR | Status: DC
Start: 2023-09-17 — End: 2023-09-17

## 2023-09-17 MED ORDER — ETOMIDATE 2 MG/ML IV SOLN: 20.0000 mg | Freq: Once | INTRAVENOUS | Status: DC

## 2023-09-17 MED ORDER — FENTANYL CITRATE PF 50 MCG/ML IJ SOSY
200.0000 ug | PREFILLED_SYRINGE | Freq: Once | INTRAMUSCULAR | Status: DC
Start: 2023-09-17 — End: 2023-09-17

## 2023-09-17 MED ORDER — ACETAMINOPHEN 160 MG/5ML PO SOLN
650.0000 mg | ORAL | Status: AC | PRN
  Administered 2023-09-17 – 2023-09-20 (×3): 650 mg
  Filled 2023-09-17 (×3): qty 20.3

## 2023-09-17 MED ORDER — ROCURONIUM BROMIDE 10 MG/ML (PF) SYRINGE
100.0000 mg | PREFILLED_SYRINGE | Freq: Once | INTRAVENOUS | Status: DC
  Filled 2023-09-17: qty 10

## 2023-09-17 MED ORDER — ETOMIDATE 2 MG/ML IV SOLN
20.0000 mg | Freq: Once | INTRAVENOUS | Status: DC
  Filled 2023-09-17: qty 10

## 2023-09-17 NOTE — Progress Notes (Signed)
 SLP Cancellation Note  Patient Details Name: Becky Hendricks MRN: [id number] DOB: 1945-12-30   Cancelled treatment:        Attempted to see pt for swallowing reassessment.  Unfortunately, pt continues to require ventilator support at this time and is not appropriate for PO trials.  SLP will continue to follow for medical readiness.   Anette FORBES Grippe, MA, CCC-SLP Acute Rehabilitation Services Office: [phone] 09/17/2023, 8:29 AM

## 2023-09-17 NOTE — Progress Notes (Signed)
Transported patient to and from MRI while patient was ion the mechanical ventilator. Patient remained stable during transport.

## 2023-09-17 NOTE — Progress Notes (Signed)
 NAME:  Becky Hendricks, MRN:  [id number], DOB:  Oct 10, 1945, LOS: 5 ADMISSION DATE:  09/12/2023 CONSULTATION DATE:  09/12/2023 REFERRING MD:  Patt - EDP, CHIEF COMPLAINT:  Unresponsiveness   History of Present Illness:  78 year old woman who presented to [facility] ED 1/30 after she was found unresponsive in her wheelchair at her facility with reported SpO2 75%. PMHx significant for HTN, HLD, PAF, prior cardiac arrest, AoC respiratory failure with hypoxia/hypercapnia (on 3L HOT), T2DM, osteomyelitis, C. Diff, bipolar disorder, functional quadriplegia. Recent presentation to [facility] ED 07/19/2023 for episode of unresponsiveness. Admitted with COVID 03/2023.  History is obtained primarily from chart review. Per EMS, patient was found unresponsive in her wheelchair at her facility with SpO2 75%. Placed on NRB and intubated on ED arrival. Labs were notable for WBC 11.3, Hgb 13.5, Plt 145. Na 141, K 4.8, CO2 32, Cr 0.63 (baseline), LFTs WNL. VBG 7.344/pCO2 65.9/bicarb 35.9. LA 0.8. VPA level WNL. Post-intubation ABG 7.384/pCO2 53.6/pO2 293/bicarb 32. CXR with low lung volumes, no active disease. AXR nonobstructive bowel gas pattern. CT Head NAICA.  PCCM consulted for ICU admission.  Pertinent Medical History:   Past Medical History:  Diagnosis Date   Acute on chronic respiratory failure with hypoxia and hypercapnia (HCC)    Acute respiratory failure (HCC) 03/29/2021   Atrial fibrillation (HCC)    Bipolar disorder (HCC) 04/27/2020   Cardiac arrest (HCC)    Clostridium difficile diarrhea 08/02/2019   Colitis 03/29/2021   Dehydration    Drug rash    Encephalopathy acute 11/18/2020   Essential hypertension    Fall 02/11/2020   Hypoxia    Morbid obesity (HCC)    Nausea and vomiting 11/17/2020   Osteomyelitis (HCC) 12/03/2019   Pneumonia due to COVID-19 virus 09/15/2019   Pneumonia of lower lobe due to infectious organism 03/29/2021   Severe sepsis (HCC) 12/03/2019   SIRS (systemic inflammatory response  syndrome) (HCC) 11/17/2020   Type 2 diabetes mellitus (HCC)    Urinary tract infection without hematuria 09/15/2019   Weakness    Significant Hospital Events: Including procedures, antibiotic start and stop dates in addition to other pertinent events   1/30 - Presented from facility after being found unresponsive in wheelchair hypoxic to 75%. Intubated in ED. 2/1 reintubated after aspiration event from dinner  Interim History / Subjective:  Less responsive but intermittently follows commands  Objective:  Blood pressure (!) 146/58, pulse 72, temperature (!) 100.8 F (38.2 C), temperature source Axillary, resp. rate (!) 37, height 5' 6 (1.676 m), weight 89.3 kg, SpO2 98%.    Vent Mode: CPAP;PSV FiO2 (%):  [40 %] 40 % Set Rate:  [12 bmp-28 bmp] 12 bmp Vt Set:  [400 mL] 400 mL PEEP:  [5 cmH20-8 cmH20] 5 cmH20 Pressure Support:  [15 cmH20] 15 cmH20 Plateau Pressure:  [18 cmH20-22 cmH20] 18 cmH20   Intake/Output Summary (Last 24 hours) at 09/17/2023 1239 Last data filed at 09/17/2023 1200 Gross per 24 hour  Intake 965.33 ml  Output 475 ml  Net 490.33 ml   Filed Weights   09/15/23 0438 09/16/23 0800 09/17/23 0630  Weight: 87.7 kg 89.2 kg 89.3 kg   Physical Examination: Chronically ill Pupils remain pinpoint Tracks intermittently and follows commands x4 but only occasionally Abd soft, having BM Rhonci, triggers vent No edema  Labs stable  Assessment & Plan:  Acute hypoxemic respiratory failure and acute on chronic hypercarbic respiratory failure, suspected polypharmacy + untreated OHS + now seems like recurrent aspiration; overall background is FTT recurrent  admissions and ambulatory failure RSV+ Shock state after intubation- resolved Chronic respiratory failure on HOT 3L Paroxysmal Afib HFpEF HTN HLD History of prior cardiac arrest T2DM Bipolar disorder Ongoing intermittent encephalopathy- EEG neg, will check MRI for completeness sake, really waxes/wanes which is more  c/w critical illness delirium  - f/u tracheal aspirate, still pending - Continue vent support - full dose lovenox  retimed to Thursday - MRI - See IPAL discussion 2/2 - TF - Trach tomorrow if MRI is okay, brother agrees - LTACH consult  33 min cc time  Rolan Sharps MD PCCM

## 2023-09-17 NOTE — Progress Notes (Addendum)
 PT Cancellation Note  Patient Details Name: Lalonnie Shaffer MRN: [id number] DOB: November 01, 1945   Cancelled Treatment:    Reason Eval/Treat Not Completed: Other (comment)  Spoke with RN, possible wean from vent today. Will attempt to follow-up this afternoon for PT re-assessment as schedule allows.  Addendum 1524: Still intubated, planning for trach tomorrow. Will continue to follow.   Leontine Roads, PT, DPT [facility]  Rehabilitation Services Physical Therapist Office: [phone] Website: [email or web address]  Leontine GORMAN Roads 09/17/2023, 10:32 AM

## 2023-09-18 ENCOUNTER — Inpatient Hospital Stay (HOSPITAL_COMMUNITY): Payer: Medicare Other

## 2023-09-18 DIAGNOSIS — J9601 Acute respiratory failure with hypoxia: Secondary | ICD-10-CM | POA: Diagnosis not present

## 2023-09-18 DIAGNOSIS — J9622 Acute and chronic respiratory failure with hypercapnia: Secondary | ICD-10-CM | POA: Diagnosis not present

## 2023-09-18 LAB — CBC
HCT: 32 % — ABNORMAL LOW (ref 36.0–46.0)
Hemoglobin: 10 g/dL — ABNORMAL LOW (ref 12.0–15.0)
MCH: 30.7 pg (ref 26.0–34.0)
MCHC: 31.3 g/dL (ref 30.0–36.0)
MCV: 98.2 fL (ref 80.0–100.0)
Platelets: 188 10*3/uL (ref 150–400)
RBC: 3.26 MIL/uL — ABNORMAL LOW (ref 3.87–5.11)
RDW: 15.1 % (ref 11.5–15.5)
WBC: 8.8 10*3/uL (ref 4.0–10.5)
nRBC: 0.2 % (ref 0.0–0.2)

## 2023-09-18 LAB — BASIC METABOLIC PANEL
Anion gap: 11 (ref 5–15)
BUN: 33 mg/dL — ABNORMAL HIGH (ref 8–23)
CO2: 30 mmol/L (ref 22–32)
Calcium: 8.4 mg/dL — ABNORMAL LOW (ref 8.9–10.3)
Chloride: 102 mmol/L (ref 98–111)
Creatinine, Ser: 0.53 mg/dL (ref 0.44–1.00)
GFR, Estimated: >60 mL/min (ref >60)
Glucose, Bld: 217 mg/dL — ABNORMAL HIGH (ref 70–99)
Potassium: 3.6 mmol/L (ref 3.5–5.1)
Sodium: 143 mmol/L (ref 135–145)

## 2023-09-18 LAB — GLUCOSE, CAPILLARY
Glucose-Capillary: 120 mg/dL — ABNORMAL HIGH (ref 70–99)
Glucose-Capillary: 127 mg/dL — ABNORMAL HIGH (ref 70–99)
Glucose-Capillary: 132 mg/dL — ABNORMAL HIGH (ref 70–99)
Glucose-Capillary: 153 mg/dL — ABNORMAL HIGH (ref 70–99)
Glucose-Capillary: 182 mg/dL — ABNORMAL HIGH (ref 70–99)
Glucose-Capillary: 190 mg/dL — ABNORMAL HIGH (ref 70–99)
Glucose-Capillary: 252 mg/dL — ABNORMAL HIGH (ref 70–99)

## 2023-09-18 LAB — CULTURE, RESPIRATORY W GRAM STAIN

## 2023-09-18 LAB — PHOSPHORUS: Phosphorus: 3.9 mg/dL (ref 2.5–4.6)

## 2023-09-18 LAB — MAGNESIUM: Magnesium: 2.4 mg/dL (ref 1.7–2.4)

## 2023-09-18 MED ORDER — ROCURONIUM BROMIDE 10 MG/ML (PF) SYRINGE
100.0000 mg | PREFILLED_SYRINGE | Freq: Once | INTRAVENOUS | Status: AC
  Administered 2023-09-18: 100 mg via INTRAVENOUS

## 2023-09-18 MED ORDER — FENTANYL CITRATE PF 50 MCG/ML IJ SOSY
PREFILLED_SYRINGE | INTRAMUSCULAR | Status: AC
  Administered 2023-09-18: 200 ug via INTRAVENOUS
  Filled 2023-09-18: qty 4

## 2023-09-18 MED ORDER — ETOMIDATE 2 MG/ML IV SOLN
INTRAVENOUS | Status: AC
  Administered 2023-09-18: 20 mg via INTRAVENOUS
  Filled 2023-09-18: qty 10

## 2023-09-18 MED ORDER — POTASSIUM CHLORIDE 20 MEQ PO PACK
40.0000 meq | PACK | Freq: Once | ORAL | Status: AC
  Administered 2023-09-18: 40 meq
  Filled 2023-09-18: qty 2

## 2023-09-18 MED ORDER — ETOMIDATE 2 MG/ML IV SOLN: 20.0000 mg | Freq: Once | INTRAVENOUS | Status: AC

## 2023-09-18 MED ORDER — INSULIN ASPART 100 UNIT/ML IJ SOLN
0.0000 [IU] | INTRAMUSCULAR | Status: DC
  Administered 2023-09-18: 3 [IU] via SUBCUTANEOUS
  Administered 2023-09-18: 2 [IU] via SUBCUTANEOUS
  Administered 2023-09-19 (×2): 5 [IU] via SUBCUTANEOUS
  Administered 2023-09-19: 8 [IU] via SUBCUTANEOUS
  Administered 2023-09-19: 5 [IU] via SUBCUTANEOUS
  Administered 2023-09-19 (×2): 8 [IU] via SUBCUTANEOUS
  Administered 2023-09-20 (×6): 5 [IU] via SUBCUTANEOUS
  Administered 2023-09-21: 3 [IU] via SUBCUTANEOUS
  Administered 2023-09-21: 5 [IU] via SUBCUTANEOUS
  Administered 2023-09-21: 3 [IU] via SUBCUTANEOUS
  Administered 2023-09-21: 5 [IU] via SUBCUTANEOUS
  Administered 2023-09-21: 12 [IU] via SUBCUTANEOUS
  Administered 2023-09-21 – 2023-09-22 (×2): 3 [IU] via SUBCUTANEOUS
  Administered 2023-09-22: 5 [IU] via SUBCUTANEOUS
  Administered 2023-09-22: 2 [IU] via SUBCUTANEOUS
  Administered 2023-09-22 (×2): 3 [IU] via SUBCUTANEOUS
  Administered 2023-09-22 (×2): 5 [IU] via SUBCUTANEOUS
  Administered 2023-09-23 (×2): 2 [IU] via SUBCUTANEOUS
  Administered 2023-09-23: 5 [IU] via SUBCUTANEOUS
  Administered 2023-09-23: 3 [IU] via SUBCUTANEOUS
  Administered 2023-09-23 – 2023-09-24 (×2): 2 [IU] via SUBCUTANEOUS
  Administered 2023-09-24: 3 [IU] via SUBCUTANEOUS

## 2023-09-18 MED ORDER — MIDAZOLAM HCL 2 MG/2ML IJ SOLN
5.0000 mg | Freq: Once | INTRAMUSCULAR | Status: AC
  Administered 2023-09-18: 2 mg via INTRAVENOUS

## 2023-09-18 MED ORDER — FENTANYL CITRATE PF 50 MCG/ML IJ SOSY
200.0000 ug | PREFILLED_SYRINGE | Freq: Once | INTRAMUSCULAR | Status: AC
Start: 2023-09-18 — End: 2023-09-18

## 2023-09-18 NOTE — Progress Notes (Signed)
 NAME:  Becky Hendricks, MRN:  [id number], DOB:  [birth year], LOS: 6 ADMISSION DATE:  09/12/2023 CONSULTATION DATE:  09/12/2023 REFERRING MD:  Patt - EDP, CHIEF COMPLAINT:  Unresponsiveness   History of Present Illness:  78 year old woman who presented to [facility] ED 1/30 after she was found unresponsive in her wheelchair at her facility with reported SpO2 75%. PMHx significant for HTN, HLD, PAF, prior cardiac arrest, AoC respiratory failure with hypoxia/hypercapnia (on 3L HOT), T2DM, osteomyelitis, C. Diff, bipolar disorder, functional quadriplegia. Recent presentation to [facility] ED 07/19/2023 for episode of unresponsiveness. Admitted with COVID 03/2023.  History is obtained primarily from chart review. Per EMS, patient was found unresponsive in her wheelchair at her facility with SpO2 75%. Placed on NRB and intubated on ED arrival. Labs were notable for WBC 11.3, Hgb 13.5, Plt 145. Na 141, K 4.8, CO2 32, Cr 0.63 (baseline), LFTs WNL. VBG 7.344/pCO2 65.9/bicarb 35.9. LA 0.8. VPA level WNL. Post-intubation ABG 7.384/pCO2 53.6/pO2 293/bicarb 32. CXR with low lung volumes, no active disease. AXR nonobstructive bowel gas pattern. CT Head NAICA.  PCCM consulted for ICU admission.  Pertinent Medical History:   Past Medical History:  Diagnosis Date   Acute on chronic respiratory failure with hypoxia and hypercapnia (HCC)    Acute respiratory failure (HCC) 03/29/2021   Atrial fibrillation (HCC)    Bipolar disorder (HCC) 04/27/2020   Cardiac arrest (HCC)    Clostridium difficile diarrhea 08/02/2019   Colitis 03/29/2021   Dehydration    Drug rash    Encephalopathy acute 11/18/2020   Essential hypertension    Fall 02/11/2020   Hypoxia    Morbid obesity (HCC)    Nausea and vomiting 11/17/2020   Osteomyelitis (HCC) 12/03/2019   Pneumonia due to COVID-19 virus 09/15/2019   Pneumonia of lower lobe due to infectious organism 03/29/2021   Severe sepsis (HCC) 12/03/2019   SIRS (systemic inflammatory response  syndrome) (HCC) 11/17/2020   Type 2 diabetes mellitus (HCC)    Urinary tract infection without hematuria 09/15/2019   Weakness    Significant Hospital Events: Including procedures, antibiotic start and stop dates in addition to other pertinent events   1/30 - Presented from facility after being found unresponsive in wheelchair hypoxic to 75%. Intubated in ED. 2/1 reintubated after aspiration event from dinner  Interim History / Subjective:  MRI okay For trach today  Objective:  Blood pressure (!) 125/55, pulse (!) 52, temperature 99 F (37.2 C), temperature source Axillary, resp. rate (!) 22, height 5' 6 (1.676 m), weight 89.3 kg, SpO2 99%.    Vent Mode: PRVC FiO2 (%):  [40 %] 40 % Set Rate:  [12 bmp] 12 bmp Vt Set:  [400 mL] 400 mL PEEP:  [5 cmH20] 5 cmH20 Pressure Support:  [15 cmH20] 15 cmH20 Plateau Pressure:  [16 cmH20-20 cmH20] 16 cmH20   Intake/Output Summary (Last 24 hours) at 09/18/2023 0847 Last data filed at 09/18/2023 0700 Gross per 24 hour  Intake 816 ml  Output 1150 ml  Net -334 ml   Filed Weights   09/15/23 0438 09/16/23 0800 09/17/23 0630  Weight: 87.7 kg 89.2 kg 89.3 kg   Physical Examination: Follows commands intermittently Abd soft Tracks with eyes Lungs with some more rhonci Sacral wound dressed  Labs benign  Assessment & Plan:  Acute hypoxemic respiratory failure and acute on chronic hypercarbic respiratory failure, suspected polypharmacy + untreated OHS + now seems like recurrent aspiration; overall background is FTT recurrent admissions and ambulatory failure RSV+ Shock state after intubation- resolved  Chronic respiratory failure on HOT 3L Paroxysmal Afib HFpEF HTN HLD History of prior cardiac arrest T2DM Bipolar disorder Ongoing intermittent encephalopathy- EEG neg, will check MRI for completeness sake, really waxes/wanes which is more c/w critical illness delirium  - f/u tracheal aspirate, still pending - Continue vent support - full  dose lovenox  retimed to tomorrow - trach today to facilitate vent weaning - LTACH referral made, appreciate help  31 min cc time  Rolan Sharps MD PCCM

## 2023-09-18 NOTE — Progress Notes (Signed)
 PT Cancellation Note  Patient Details Name: Becky Hendricks MRN: [id number] DOB: 1946-08-13   Cancelled Treatment:    Reason Eval/Treat Not Completed: Medical issues which prohibited therapy. Tracheostomy today, PT will follow up tomorrow for further treatment.   Becky Hendricks 09/18/2023, 11:17 AM

## 2023-09-18 NOTE — Procedures (Addendum)
 Cortrak  Tube Type:  Cortrak - 43 inches Tube Location:  Left nare Initial Placement:  Stomach Secured by: Bridle Technique Used to Measure Tube Placement:  Marking at nare/corner of mouth Cortrak Secured At:  67 cm   Cortrak Tube Team Note:   Received call from nursing, cortrak tube noted to be coiled in patient's mouth. Tube was adjusted without incidence.   Abdominal x-ray has been ordered by the Cortrak team. Please confirm tube placement before using the Cortrak tube.   If the tube becomes dislodged please keep the tube and contact the Cortrak team at [email or web address] for replacement.  If after hours and replacement cannot be delayed, place a NG tube and confirm placement with an abdominal x-ray.   Augustin Shams MS, RD, LDN If unable to be reached, please send secure chat to RD inpatient available from 8:00a-4:00p daily

## 2023-09-18 NOTE — Progress Notes (Signed)
 SLP Cancellation Note  Patient Details Name: Becky Hendricks MRN: [id number] DOB: February 22, 1946   Cancelled treatment:       Reason Eval/Treat Not Completed: Patient with new tracheostomy. Orders for SLP eval and treat for PMSV and swallowing received. Will follow pt closely for readiness for SLP interventions as appropriate.     Damien GORMAN Blumenthal 09/18/2023, 11:29 AM

## 2023-09-18 NOTE — Procedures (Signed)
 Percutaneous Tracheostomy Procedure Note   Rhenda [place]  [id number]  Apr 29, 1946  Date:09/18/23  Time:10:05 AM   Provider Performing:Benancio Osmundson C Claudene  Procedure: Percutaneous Tracheostomy with Bronchoscopic Guidance ([id number])  Indication(s) Persistent respiratory failure  Consent Risks of the procedure as well as the alternatives and risks of each were explained to the patient and/or caregiver.  Consent for the procedure was obtained.  Anesthesia Etomidate , Versed , Fentanyl , Rocuronium    Time Out Verified patient identification, verified procedure, site/side was marked, verified correct patient position, special equipment/implants available, medications/allergies/relevant history reviewed, required imaging and test results available.   Sterile Technique Maximal sterile technique including sterile barrier drape, hand hygiene, sterile gown, sterile gloves, mask, hair covering.    Procedure Description Appropriate anatomy identified by palpation.  Patient's neck prepped and draped in sterile fashion.  1% lidocaine  with epinephrine  was used to anesthetize skin overlying neck.  1.5cm incision made and blunt dissection performed until tracheal rings could be easily palpated.   Then a size 6-0 Shiley tracheostomy was placed under bronchoscopic visualization using usual Seldinger technique and serial dilation.   Bronchoscope confirmed placement above the carina.  Tracheostomy was sutured in place with adhesive pad to protect skin under pressure.    Patient connected to ventilator.   Complications/Tolerance None; patient tolerated the procedure well. Chest X-ray is ordered to confirm no post-procedural complication.   EBL Minimal   Specimen(s) None

## 2023-09-18 NOTE — Progress Notes (Signed)
 eLink Physician-Brief Progress Note Patient Name: Becky Hendricks DOB: [birth year] MRN: [id number]   Date of Service  09/18/2023  HPI/Events of Note  Asymptomatic bradycardia.  eICU Interventions  Plan is to monitor patient closely and treat for heart rate < 40 or BP drop.        Vasti Yagi U Penelope Fittro 09/18/2023, 3:55 AM

## 2023-09-18 NOTE — Procedures (Signed)
 Cortrak  Tube Type:  Cortrak - 43 inches Tube Location:  Left nare Initial Placement:  Stomach Secured by: Bridle Technique Used to Measure Tube Placement:  Marking at nare/corner of mouth Cortrak Secured At:  79 cm   Cortrak Tube Team Note:  Consult received to place a Cortrak feeding tube.   X-ray is required, abdominal x-ray has been ordered by the Cortrak team. Please confirm tube placement before using the Cortrak tube.   If the tube becomes dislodged please keep the tube and contact the Cortrak team at [email or web address] for replacement.  If after hours and replacement cannot be delayed, place a NG tube and confirm placement with an abdominal x-ray.    Augustin Shams MS, RD, LDN If unable to be reached, please send secure chat to RD inpatient available from 8:00a-4:00p daily

## 2023-09-18 NOTE — Procedures (Signed)
 Bedside Tracheostomy Insertion Procedure Note   Patient Details:   Name: Becky Hendricks DOB: March 17, 1946 MRN: [id number]  Procedure: Tracheostomy  Pre Procedure Assessment: ET Tube Size: 7.5 ET Tube secured at lip (cm): 23 Bite block in place: No Breath Sounds: Clear and Diminished  Post Procedure Assessment: BP (!) 125/55   Pulse (!) 52   Temp 99 F (37.2 C) (Axillary)   Resp (!) 22   Ht 5' 6 (1.676 m)   Wt 89.3 kg   LMP  (LMP Unknown)   SpO2 99%   BMI 31.78 kg/m  O2 sats: stable throughout Complications: No apparent complications Patient did tolerate procedure well Tracheostomy Brand:Shiley Tracheostomy Style:Cuffed Tracheostomy Size:  6 Tracheostomy Secured cpj:Dlulmzd, velcro Tracheostomy Placement Confirmation:Trach cuff visualized and in place and Chest X ray ordered for placement    [name] 09/18/2023, 11:47 AM

## 2023-09-18 NOTE — Progress Notes (Signed)
[facility] ADULT ICU REPLACEMENT PROTOCOL   The patient does apply for the [facility] Adult ICU Electrolyte Replacment Protocol based on the criteria listed below:   1.Exclusion criteria: TCTS, ECMO, Dialysis, and Myasthenia Gravis patients 2. Is GFR >/= 30 ml/min? Yes.    Patient's GFR today is >60 3. Is SCr </= 2? Yes.   Patient's SCr is 0.53 mg/dL 4. Did SCr increase >/= 0.5 in 24 hours? No. 5.Pt's weight >40kg  Yes.   6. Abnormal electrolyte(s): potassium 3.6  7. Electrolytes replaced per protocol 8.  Call MD STAT for K+ </= 2.5, Phos </= 1, or Mag </= 1 Physician:  protocol  Melvern Banker 09/18/2023 5:41 AM

## 2023-09-19 DIAGNOSIS — I480 Paroxysmal atrial fibrillation: Secondary | ICD-10-CM | POA: Diagnosis not present

## 2023-09-19 DIAGNOSIS — J9622 Acute and chronic respiratory failure with hypercapnia: Secondary | ICD-10-CM | POA: Diagnosis not present

## 2023-09-19 DIAGNOSIS — J9621 Acute and chronic respiratory failure with hypoxia: Secondary | ICD-10-CM | POA: Diagnosis not present

## 2023-09-19 LAB — CBC
HCT: 33 % — ABNORMAL LOW (ref 36.0–46.0)
Hemoglobin: 10.3 g/dL — ABNORMAL LOW (ref 12.0–15.0)
MCH: 30.7 pg (ref 26.0–34.0)
MCHC: 31.2 g/dL (ref 30.0–36.0)
MCV: 98.2 fL (ref 80.0–100.0)
Platelets: 220 10*3/uL (ref 150–400)
RBC: 3.36 MIL/uL — ABNORMAL LOW (ref 3.87–5.11)
RDW: 14.8 % (ref 11.5–15.5)
WBC: 10.7 10*3/uL — ABNORMAL HIGH (ref 4.0–10.5)
nRBC: 0.3 % — ABNORMAL HIGH (ref 0.0–0.2)

## 2023-09-19 LAB — GLUCOSE, CAPILLARY
Glucose-Capillary: 260 mg/dL — ABNORMAL HIGH (ref 70–99)
Glucose-Capillary: 207 mg/dL — ABNORMAL HIGH (ref 70–99)
Glucose-Capillary: 257 mg/dL — ABNORMAL HIGH (ref 70–99)
Glucose-Capillary: 214 mg/dL — ABNORMAL HIGH (ref 70–99)
Glucose-Capillary: 221 mg/dL — ABNORMAL HIGH (ref 70–99)

## 2023-09-19 LAB — BASIC METABOLIC PANEL
Anion gap: 11 (ref 5–15)
BUN: 27 mg/dL — ABNORMAL HIGH (ref 8–23)
CO2: 31 mmol/L (ref 22–32)
Calcium: 8.6 mg/dL — ABNORMAL LOW (ref 8.9–10.3)
Chloride: 100 mmol/L (ref 98–111)
Creatinine, Ser: 0.55 mg/dL (ref 0.44–1.00)
GFR, Estimated: >60 mL/min (ref >60)
Glucose, Bld: 265 mg/dL — ABNORMAL HIGH (ref 70–99)
Potassium: 4.3 mmol/L (ref 3.5–5.1)
Sodium: 142 mmol/L (ref 135–145)

## 2023-09-19 LAB — MAGNESIUM: Magnesium: 2.2 mg/dL (ref 1.7–2.4)

## 2023-09-19 LAB — PHOSPHORUS: Phosphorus: 3.1 mg/dL (ref 2.5–4.6)

## 2023-09-19 LAB — TRIGLYCERIDES: Triglycerides: 150 mg/dL — ABNORMAL HIGH (ref <150)

## 2023-09-19 MED ORDER — METHOCARBAMOL 500 MG PO TABS: 500.0000 mg | ORAL_TABLET | Freq: Two times a day (BID) | ORAL | Status: DC

## 2023-09-19 MED ORDER — MODAFINIL 100 MG PO TABS
200.0000 mg | ORAL_TABLET | Freq: Every day | ORAL | Status: DC
  Administered 2023-09-19 – 2023-09-23 (×5): 200 mg
  Filled 2023-09-19 (×5): qty 2

## 2023-09-19 MED ORDER — INSULIN ASPART 100 UNIT/ML IJ SOLN
3.0000 [IU] | INTRAMUSCULAR | Status: DC
  Administered 2023-09-19 – 2023-09-21 (×13): 3 [IU] via SUBCUTANEOUS

## 2023-09-19 MED ORDER — SODIUM CHLORIDE 0.9 % IV SOLN
2.0000 g | INTRAVENOUS | Status: DC
  Administered 2023-09-19 – 2023-09-21 (×3): 2 g via INTRAVENOUS
  Filled 2023-09-19 (×3): qty 20

## 2023-09-19 NOTE — Progress Notes (Addendum)
 NAME:  Becky Hendricks, MRN:  [id number], DOB:  [birth year], LOS: 7 ADMISSION DATE:  09/12/2023 CONSULTATION DATE:  09/12/2023 REFERRING MD:  Patt - EDP, CHIEF COMPLAINT:  Unresponsiveness   History of Present Illness:  78 year old woman who presented to [facility] ED 1/30 after she was found unresponsive in her wheelchair at her facility with reported SpO2 75%. PMHx significant for HTN, HLD, PAF, prior cardiac arrest, AoC respiratory failure with hypoxia/hypercapnia (on 3L HOT), T2DM, osteomyelitis, C. Diff, bipolar disorder, functional quadriplegia. Recent presentation to [facility] ED 07/19/2023 for episode of unresponsiveness. Admitted with COVID 03/2023.  History is obtained primarily from chart review. Per EMS, patient was found unresponsive in her wheelchair at her facility with SpO2 75%. Placed on NRB and intubated on ED arrival. Labs were notable for WBC 11.3, Hgb 13.5, Plt 145. Na 141, K 4.8, CO2 32, Cr 0.63 (baseline), LFTs WNL. VBG 7.344/pCO2 65.9/bicarb 35.9. LA 0.8. VPA level WNL. Post-intubation ABG 7.384/pCO2 53.6/pO2 293/bicarb 32. CXR with low lung volumes, no active disease. AXR nonobstructive bowel gas pattern. CT Head NAICA.  PCCM consulted for ICU admission.  Pertinent Medical History:   Past Medical History:  Diagnosis Date   Acute on chronic respiratory failure with hypoxia and hypercapnia (HCC)    Acute respiratory failure (HCC) 03/29/2021   Atrial fibrillation (HCC)    Bipolar disorder (HCC) 04/27/2020   Cardiac arrest (HCC)    Clostridium difficile diarrhea 08/02/2019   Colitis 03/29/2021   Dehydration    Drug rash    Encephalopathy acute 11/18/2020   Essential hypertension    Fall 02/11/2020   Hypoxia    Morbid obesity (HCC)    Nausea and vomiting 11/17/2020   Osteomyelitis (HCC) 12/03/2019   Pneumonia due to COVID-19 virus 09/15/2019   Pneumonia of lower lobe due to infectious organism 03/29/2021   Severe sepsis (HCC) 12/03/2019   SIRS (systemic inflammatory response  syndrome) (HCC) 11/17/2020   Type 2 diabetes mellitus (HCC)    Urinary tract infection without hematuria 09/15/2019   Weakness    Significant Hospital Events: Including procedures, antibiotic start and stop dates in addition to other pertinent events   1/30 - Presented from facility after being found unresponsive in wheelchair hypoxic to 75%. Intubated in ED. 2/1 reintubated after aspiration event from dinner 2/5 s/p trach  Interim History / Subjective:  On PRVC Trach in place  Resp culture from 2/4 growing some proteus mirabilis; wbc 10.7 from 8.8 possible reactive post trach and afebrile  Objective:  Blood pressure 110/62, pulse 66, temperature 99.5 F (37.5 C), temperature source Axillary, resp. rate 19, height 5' 6 (1.676 m), weight 86.8 kg, SpO2 98%.    Vent Mode: PRVC FiO2 (%):  [40 %-60 %] 40 % Set Rate:  [16 bmp] 16 bmp Vt Set:  [400 mL] 400 mL PEEP:  [5 cmH20] 5 cmH20 Plateau Pressure:  [16 cmH20-23 cmH20] 17 cmH20   Intake/Output Summary (Last 24 hours) at 09/19/2023 1046 Last data filed at 09/19/2023 1000 Gross per 24 hour  Intake 1430 ml  Output 1125 ml  Net 305 ml   Filed Weights   09/16/23 0800 09/17/23 0630 09/19/23 0500  Weight: 89.2 kg 89.3 kg 86.8 kg   Physical Examination:  General:  ill appearing female on mech vent HEENT: MM pink/moist; trach in place Neuro: opens eyes to voice and follows some commands; generalized weakness CV: s1s2, RRR, no m/r/g PULM:  dim clear BS bilaterally; on mech vent PRVC GI: soft, bsx4 active  Extremities: warm/dry,  no edema; sacral wound w/ dressing   Assessment & Plan:  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: suspected polypharmacy + untreated OHS + now seems like recurrent aspiration; overall background is FTT recurrent admissions and ambulatory failure -extubated 1/31 and reintubated on 2/1; Trached 2/5 RSV+ -resp culture on 2/4 w/ proteus mirabilis Plan: -will attempt PSV trials today; rest  on PRVC overnight; consider TCT if doing well -LTVV strategy with tidal volumes of 6-8 cc/kg ideal body weight -Wean PEEP/FiO2 for SpO2 >92% -VAP bundle in place -Daily SAT and SBT -PAD protocol in place -wean sedation for RASS goal 0 to -1 -will start on rocephin  x 5 days -pulm toiletry -pt/ot/slp -TOC following for ltac placement -cor track in place; will likely need peg tube  Paroxysmal Afib HFpEF HTN HLD Plan: -therapeutic lovenox  -statin -daily weights; strict I/o's  T2DM Plan: -elevated glucose -add TF coverage -ssi and cbg monitoring  Functional Quad Bipolar disorder Ongoing intermittent encephalopathy- EEG neg, will check MRI no acute findings Plan: -delirium precautions -limit sedating meds -pt/ot -cont home risperdal , depakene . hold robaxin  for now w/ ams -start provigil   35 min cc time  JD Emilio RIGGERS [facility] 09/19/2023, 11:11 AM  Please see [email or web address] for pager details.  From 7A-7P if no response, please call [phone]. After hours, please call ELink [phone].

## 2023-09-19 NOTE — Evaluation (Signed)
 Physical Therapy Re-Evaluation Patient Details Name: Becky Hendricks MRN: [id number] DOB: 28-Aug-1945 Today's Date: 09/19/2023  History of Present Illness  78 y.o female presented 1/30  after being found unresponsive in her wheelchair at her facility with SpO2 75%. + RSV. 2/1 reintubated after aspiration event from dinner. Trach placed 2/5. 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   Clinical Impression  Re-evaluated following re-intubation 2/1 and trach 2/5. Max assist +2 for bed mobility. Able to sit EOB with CGA following some seated balance training. Tolerated LE exercises well. Responds with nods and thumbs up/down appropriately. Mouthing words on occasion. After approx 6-8 min on EOB pt increased WOB, coughing and secretions in vent tube, RN provided deep suction and called RT. Assisted pt back to supine with HOB >30 deg. RT arrived. Patient will continue to benefit from skilled physical therapy services to further improve independence with functional mobility.         If plan is discharge home, recommend the following: Two people to help with walking and/or transfers;Two people to help with bathing/dressing/bathroom;Assist for transportation;Assistance with cooking/housework;Supervision due to cognitive status   Can travel by private vehicle   No    Equipment Recommendations None recommended by PT  Recommendations for Other Services       Functional Status Assessment Patient has had a recent decline in their functional status and demonstrates the ability to make significant improvements in function in a reasonable and predictable amount of time.     Precautions / Restrictions Precautions Precautions: Fall Restrictions Weight Bearing Restrictions Per Provider Order: No       Mobility  Bed Mobility Overal bed mobility: Needs Assistance Bed Mobility: Rolling, Sidelying to Sit, Sit to Sidelying Rolling: Max assist, +2 for physical assistance Sidelying to sit: Max assist, +2 for physical assistance, HOB elevated     Sit to sidelying: Max assist, +2 for physical assistance, HOB elevated General bed mobility comments: max assist +2 to roll in bed several tiems to reposition. Pt assists by reaching with UE. Trace movement of LEs with tactile and verbal cues to bring to EOB. Max +2 for transitions from sidelying<>sit. She was able to lower self onto Rt elbow with guidance but needed full support for trunk, LEs, and management of lines.    Transfers                   General transfer comment: Deferred due to increased WOB on vent    Ambulation/Gait                  Stairs            Wheelchair Mobility     Tilt Bed    Modified Rankin (Stroke Patients Only)       Balance Overall balance assessment: Needs assistance Sitting-balance support: Bilateral upper extremity supported, Feet supported Sitting balance-Leahy Scale: Poor Sitting balance - Comments: Progressed from mod to CGA after sitting EOB working on seated balance. Postural control: Posterior lean                                   Pertinent Vitals/Pain Pain Assessment Pain Assessment: CPOT Facial Expression: Tense Body Movements: Absence of movements Muscle Tension: Relaxed Compliance with ventilator (intubated pts.): Coughing but  tolerating Vocalization (extubated pts.): N/A CPOT Total: 2 Pain Intervention(s): Monitored during session, Repositioned    Home Living Family/patient expects to be discharged to:: Skilled nursing facility                   Additional Comments: patient has been in LTC for several years    Prior Function Prior Level of Function : Needs assist;Patient poor historian/Family not available              Mobility Comments: Limited history provided due to cognition but states she uses a standing lift not a hoyer to transfer to w/c with staff. States she is able propel her self with W/c. Has worked with PT at [facility], unsure of if she is working with them currently but seems to allude that they treat her once per week. ADLs Comments: Does not describe     Extremity/Trunk Assessment   Upper Extremity Assessment Upper Extremity Assessment: Defer to OT evaluation    Lower Extremity Assessment Lower Extremity Assessment: Generalized weakness       Communication   Communication Communication: Tracheostomy Cueing Techniques: Verbal cues;Tactile cues  Cognition Arousal: Alert Behavior During Therapy: WFL for tasks assessed/performed Overall Cognitive Status: Difficult to assess                                 General Comments: Able nod yes/no appropriately 75% of the time or give thumbs up.        General Comments General comments (skin integrity, edema, etc.): trach/vent PEEP 5 FiO2 40%. Increased WOB while seated, copious secretions noted - RN arrived to perform deep suction x2, RT called to assist further. HR into 120s during coughing episode.    Exercises General Exercises - Lower Extremity Ankle Circles/Pumps:  (No volitional movement.) Quad Sets: Strengthening, AAROM, Both, 10 reps, Supine Long Arc Quad: Strengthening, Both, 10 reps, Seated   Assessment/Plan    PT Assessment Patient needs continued PT services  PT Problem List Decreased strength;Decreased range of motion;Decreased balance;Decreased activity tolerance;Decreased mobility;Decreased knowledge of use of DME;Decreased skin integrity;Obesity;Impaired tone;Cardiopulmonary status limiting activity;Decreased cognition;Decreased knowledge of precautions       PT Treatment Interventions DME instruction;Functional mobility training;Therapeutic activities;Therapeutic exercise;Balance training;Neuromuscular  re-education;Cognitive remediation;Patient/family education;Wheelchair mobility training;Modalities    PT Goals (Current goals can be found in the Care Plan section)  Acute Rehab PT Goals Patient Stated Goal: Continue therapy at [facility] PT Goal Formulation: With patient Time For Goal Achievement: 09/28/23 Potential to Achieve Goals: Fair    Frequency Min 1X/week     Co-evaluation               AM-PAC PT 6 Clicks Mobility  Outcome Measure Help needed turning from your back to your side while in a flat bed without using bedrails?: Total Help needed moving from lying on your back to sitting on the side of a flat bed without using bedrails?: Total Help needed moving to and from a bed to a chair (including a wheelchair)?: Total Help needed standing up from a chair using your arms (e.g., wheelchair or bedside chair)?: Total Help needed to walk in hospital room?: Total Help needed climbing 3-5 steps with a railing? : Total 6 Click Score: 6    End of Session   Activity Tolerance: Other (comment) (Coughing spell, increased WOB on vent, assisted back to supine with HOB >30 deg.) Patient left: in bed;with call bell/phone within reach;with bed  alarm set;with SCD's reapplied Nurse Communication: Mobility status;Other (comment) (increased WOB) PT Visit Diagnosis: Muscle weakness (generalized) (M62.81);Difficulty in walking, not elsewhere classified (R26.2);Other symptoms and signs involving the nervous system (R29.898)    Time: 8578-8556 PT Time Calculation (min) (ACUTE ONLY): 22 min   Charges:   PT Evaluation $PT Re-evaluation: 1 Re-eval   PT General Charges $$ ACUTE PT VISIT: 1 Visit         Leontine Roads, PT, DPT [facility]  Rehabilitation Services Physical Therapist Office: [phone] Website: [email or web address]   Leontine GORMAN Roads 09/19/2023, 3:46 PM

## 2023-09-19 NOTE — Progress Notes (Signed)
 Nutrition Follow-up  DOCUMENTATION CODES:   Not applicable  INTERVENTION:  TF via Cortrak: Osmolite 1.5 at 50ml/hr (1200ml per day) 60ml ProSource TF20 once daily Provides 1880 kcal, 95g protein and free water  flushes  Juven BID to support wound healing Consider scheduled bowel regimen  NUTRITION DIAGNOSIS:   Inadequate oral intake related to acute illness as evidenced by NPO status. - remains applicable  GOAL:   Patient will meet greater than or equal to 90% of their needs - goal met via TF  MONITOR:   Vent status, Labs, Weight trends, TF tolerance, Skin  REASON FOR ASSESSMENT:   Consult Enteral/tube feeding initiation and management (trickle start.)  ASSESSMENT:   Pt admitted from SNF after being found unresponsive in her wheelchair. PMH significant for HTN, HLD, PAF, cardiac arrest, AoC respiratory failure with hypoxia/hypercapnia, DM2, osteomyelitis, c diff, functional quadriplegia. Recently presented to [facility] ED 07/19/23 for episode of unresponsiveness. Admitted 03/2023 with COVID.  1/30: admitted; intubated 1/31: extubated 2/1: re-intubated after aspiration event from dinner 2/5: trach placement; Cortrak placed  Patient remains intubated on ventilator support via trach.  MV: 12.7 L/min Temp (24hrs), Avg:99.2 F (37.3 C), Min:98.6 F (37 C), Max:99.8 F (37.7 C)  Pt discussed in rounds. Possible plans for LTACH placement.  WOC re-consulted for assessment of stage 3 wound. Assessment reflects wound unchanged in appearance.   Checked in with pt at bedside. Cortrak placed yesterday. TF infusing at goal rate. Continues tolerating well. No BM documented x2 days. Consider addition of scheduled bowel regimen if continues.   Admit weight: 88.1 kg Current weight: 86.8 kg  Drains/lines: Cortrak (distal stomach) UOP: x24 hours  Medications: SSI 0-15 units q4h  Labs:  BUN 24 CBG's 120-260 x24 hours  Diet Order:   Diet Order             Diet  NPO time specified  Diet effective now                   EDUCATION NEEDS:   No education needs have been identified at this time  Skin:  Skin Assessment: Skin Integrity Issues: Skin Integrity Issues:: Stage III Stage III: sacrum  Last BM:  2/4  Height:   Ht Readings from Last 1 Encounters:  09/13/23 5' 6 (1.676 m)    Weight:   Wt Readings from Last 1 Encounters:  09/19/23 86.8 kg    Ideal Body Weight:  59.1 kg  BMI:  Body mass index is 30.89 kg/m.  Estimated Nutritional Needs:   Kcal:  1700-1900  Protein:  95-110g  Fluid:  >/=1.7L  Royce Maris, RDN, LDN Clinical Nutrition

## 2023-09-19 NOTE — TOC CM/SW Note (Signed)
 Transition of Care [facility]) - Inpatient Brief Assessment   Patient Details  Name: Becky Hendricks MRN: [id number] Date of Birth: Mar 27, 1946  Transition of Care [facility]) CM/SW Contact:    Tom-Johnson, Rei Medlen Daphne, RN Phone Number: 09/19/2023, 9:19 AM   Clinical Narrative:  Patient presented to the ED from [facility] after being found unresponsive in her wheelchair. Patient was intubated and sedated to protect her Airway. Patient underwent Tracheostomy for persistent Respiratory Failure yesterday 09/18/23.   TOC consulted fro LTACH placement. CM informed by [facility] that they had reached out to Patient's family.  CM called and spoke with patient's brother, Becky Hendricks [phone] ). Becky Hendricks confirmed speaking with [facility] and he chose [facility].   CM will continue to follow as patient progresses with care towards discharge.         Transition of Care Asessment:

## 2023-09-19 NOTE — Consult Note (Addendum)
 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  Requested to re-assess; consult performed remotely after review of progress notes and photo in the EMR.  Wound is basically unchanged in appearance, red and moist. Pt is critically ill with multiple systemic factors which can impair healing. Dressing procedure/placement/frequency: Pt is on a low airloss mattress to reduce pressure. Continue present plan of care.  Topical treatment orders provided for bedside nurses to perform as follows to absorb drainage and provide antimicrobial benifits: Cut small piece of Aquacel Soila # [id number]) and tuck into sacrum wound Q day, using swab to fill, then cover with foam dressing.  Change foam dressing Q 3 days or PRN soiling. Please re-consult if further assistance is needed.  Thank-you,  Stephane Fought MSN, RN, CWOCN, [place], CNS [phone]

## 2023-09-20 ENCOUNTER — Inpatient Hospital Stay (HOSPITAL_COMMUNITY): Payer: Medicare Other

## 2023-09-20 DIAGNOSIS — B964 Proteus (mirabilis) (morganii) as the cause of diseases classified elsewhere: Secondary | ICD-10-CM

## 2023-09-20 DIAGNOSIS — J9621 Acute and chronic respiratory failure with hypoxia: Secondary | ICD-10-CM | POA: Diagnosis not present

## 2023-09-20 DIAGNOSIS — J189 Pneumonia, unspecified organism: Secondary | ICD-10-CM | POA: Diagnosis not present

## 2023-09-20 DIAGNOSIS — J9622 Acute and chronic respiratory failure with hypercapnia: Secondary | ICD-10-CM | POA: Diagnosis not present

## 2023-09-20 LAB — CULTURE, RESPIRATORY W GRAM STAIN

## 2023-09-20 LAB — GLUCOSE, CAPILLARY
Glucose-Capillary: 208 mg/dL — ABNORMAL HIGH (ref 70–99)
Glucose-Capillary: 212 mg/dL — ABNORMAL HIGH (ref 70–99)
Glucose-Capillary: 219 mg/dL — ABNORMAL HIGH (ref 70–99)
Glucose-Capillary: 224 mg/dL — ABNORMAL HIGH (ref 70–99)
Glucose-Capillary: 232 mg/dL — ABNORMAL HIGH (ref 70–99)
Glucose-Capillary: 236 mg/dL — ABNORMAL HIGH (ref 70–99)
Glucose-Capillary: 243 mg/dL — ABNORMAL HIGH (ref 70–99)

## 2023-09-20 LAB — CBC
HCT: 33.6 % — ABNORMAL LOW (ref 36.0–46.0)
Hemoglobin: 10.4 g/dL — ABNORMAL LOW (ref 12.0–15.0)
MCH: 30.3 pg (ref 26.0–34.0)
MCHC: 31 g/dL (ref 30.0–36.0)
MCV: 98 fL (ref 80.0–100.0)
Platelets: 234 10*3/uL (ref 150–400)
RBC: 3.43 MIL/uL — ABNORMAL LOW (ref 3.87–5.11)
RDW: 14.5 % (ref 11.5–15.5)
WBC: 12.6 10*3/uL — ABNORMAL HIGH (ref 4.0–10.5)
nRBC: 0 % (ref 0.0–0.2)

## 2023-09-20 LAB — BASIC METABOLIC PANEL
Anion gap: 11 (ref 5–15)
BUN: 30 mg/dL — ABNORMAL HIGH (ref 8–23)
CO2: 32 mmol/L (ref 22–32)
Calcium: 8.4 mg/dL — ABNORMAL LOW (ref 8.9–10.3)
Chloride: 95 mmol/L — ABNORMAL LOW (ref 98–111)
Creatinine, Ser: 0.58 mg/dL (ref 0.44–1.00)
GFR, Estimated: >60 mL/min (ref >60)
Glucose, Bld: 253 mg/dL — ABNORMAL HIGH (ref 70–99)
Potassium: 4.2 mmol/L (ref 3.5–5.1)
Sodium: 138 mmol/L (ref 135–145)

## 2023-09-20 LAB — PHOSPHORUS: Phosphorus: 3.4 mg/dL (ref 2.5–4.6)

## 2023-09-20 LAB — MAGNESIUM: Magnesium: 2.1 mg/dL (ref 1.7–2.4)

## 2023-09-20 MED ORDER — SORBITOL 70 % SOLN
60.0000 mL | Freq: Every day | Status: DC
  Administered 2023-09-20 – 2023-09-23 (×4): 60 mL
  Filled 2023-09-20 (×4): qty 60

## 2023-09-20 MED ORDER — INSULIN GLARGINE-YFGN 100 UNIT/ML ~~LOC~~ SOLN
10.0000 [IU] | Freq: Two times a day (BID) | SUBCUTANEOUS | Status: DC
  Administered 2023-09-20 – 2023-09-21 (×3): 10 [IU] via SUBCUTANEOUS
  Filled 2023-09-20 (×4): qty 0.1

## 2023-09-20 NOTE — Plan of Care (Signed)
  Problem: Skin Integrity: Goal: Risk for impaired skin integrity will decrease Outcome: Progressing   Problem: Health Behavior/Discharge Planning: Goal: Ability to manage health-related needs will improve Outcome: Progressing   Problem: Nutritional: Goal: Maintenance of adequate nutrition will improve Outcome: Progressing

## 2023-09-20 NOTE — Progress Notes (Signed)
 Pt transported to and from CT on the ventilator.

## 2023-09-20 NOTE — Progress Notes (Signed)
 NAME:  Becky Hendricks, MRN:  [id number], DOB:  04-28-1946, LOS: 8 ADMISSION DATE:  09/12/2023 CONSULTATION DATE:  09/12/2023 REFERRING MD:  Patt - EDP, CHIEF COMPLAINT:  Unresponsiveness   History of Present Illness:  78 year old woman who presented to [facility] ED 1/30 after she was found unresponsive in her wheelchair at her facility with reported SpO2 75%. PMHx significant for HTN, HLD, PAF, prior cardiac arrest, AoC respiratory failure with hypoxia/hypercapnia (on 3L HOT), T2DM, osteomyelitis, C. Diff, bipolar disorder, functional quadriplegia. Recent presentation to [facility] ED 07/19/2023 for episode of unresponsiveness. Admitted with COVID 03/2023.  History is obtained primarily from chart review. Per EMS, patient was found unresponsive in her wheelchair at her facility with SpO2 75%. Placed on NRB and intubated on ED arrival. Labs were notable for WBC 11.3, Hgb 13.5, Plt 145. Na 141, K 4.8, CO2 32, Cr 0.63 (baseline), LFTs WNL. VBG 7.344/pCO2 65.9/bicarb 35.9. LA 0.8. VPA level WNL. Post-intubation ABG 7.384/pCO2 53.6/pO2 293/bicarb 32. CXR with low lung volumes, no active disease. AXR nonobstructive bowel gas pattern. CT Head NAICA.  PCCM consulted for ICU admission.  Pertinent Medical History:   Past Medical History:  Diagnosis Date   Acute on chronic respiratory failure with hypoxia and hypercapnia (HCC)    Acute respiratory failure (HCC) 03/29/2021   Atrial fibrillation (HCC)    Bipolar disorder (HCC) 04/27/2020   Cardiac arrest (HCC)    Clostridium difficile diarrhea 08/02/2019   Colitis 03/29/2021   Dehydration    Drug rash    Encephalopathy acute 11/18/2020   Essential hypertension    Fall 02/11/2020   Hypoxia    Morbid obesity (HCC)    Nausea and vomiting 11/17/2020   Osteomyelitis (HCC) 12/03/2019   Pneumonia due to COVID-19 virus 09/15/2019   Pneumonia of lower lobe due to infectious organism 03/29/2021   Severe sepsis (HCC) 12/03/2019   SIRS (systemic inflammatory response  syndrome) (HCC) 11/17/2020   Type 2 diabetes mellitus (HCC)    Urinary tract infection without hematuria 09/15/2019   Weakness    Significant Hospital Events: Including procedures, antibiotic start and stop dates in addition to other pertinent events   1/30 - Presented from facility after being found unresponsive in wheelchair hypoxic to 75%. Intubated in ED. 2/1 reintubated after aspiration event from dinner 2/5 s/p trach  Interim History / Subjective:  Tolerated PS for a few hours yesterday then had heavy secretions, sweating requiring PRVC again. 1 x fever yesterday am before starting abx  Objective:  Blood pressure (!) 105/54, pulse (!) 59, temperature 98.7 F (37.1 C), temperature source Axillary, resp. rate 19, height 5' 6 (1.676 m), weight 88.1 kg, SpO2 98%.    Vent Mode: PRVC FiO2 (%):  [40 %] 40 % Set Rate:  [16 bmp] 16 bmp Vt Set:  [400 mL] 400 mL PEEP:  [5 cmH20] 5 cmH20 Pressure Support:  [15 cmH20] 15 cmH20 Plateau Pressure:  [16 cmH20-19 cmH20] 17 cmH20   Intake/Output Summary (Last 24 hours) at 09/20/2023 0825 Last data filed at 09/20/2023 0800 Gross per 24 hour  Intake 1470.07 ml  Output 1350 ml  Net 120.07 ml   Filed Weights   09/17/23 0630 09/19/23 0500 09/20/23 9297  Weight: 89.3 kg 86.8 kg 88.1 kg   Physical Examination:  No distress Globally weak Intermittently follows commands RASS -1 Moves uppers more than lowers Lungs with rhonci, small thick secretions   WBC up slightly Electrolytes normal   Assessment & Plan:  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: suspected  polypharmacy + untreated OHS + now seems like recurrent aspiration; overall background is FTT recurrent admissions and ambulatory failure -extubated 1/31 and reintubated on 2/1; Trached 2/5 RSV+ Constipation Proteus HCAP ICU delirium/encephalopathy Paroxysmal Afib HFpEF HTN HLD T2DM Functional Quad Bipolar disorder Ongoing intermittent encephalopathy-  EEG neg, MRI no acute findings  - PEG consult - PT/OT/SLP as able, mental status is limiting this - PS trials with TC once secretions under better control: ceftriaxone  started 2/6 should help with this - Strengthen bowel regimen - SSI - Lovenox  and once PEG is in we can switch back to eliquis  - [facility] insurance approval pending for ongoing vent liberation efforts  31 min cc time Rolan Sharps MD PCCM  [place] [facility] 09/20/2023, 8:25 AM  Please see [email or web address] for pager details.  From 7A-7P if no response, please call [phone]. After hours, please call ELink [phone].

## 2023-09-20 NOTE — Inpatient Diabetes Management (Signed)
 Inpatient Diabetes Program Recommendations  AACE/ADA: New Consensus Statement on Inpatient Glycemic Control   Target Ranges:  Prepandial:   less than 140 mg/dL      Peak postprandial:   less than 180 mg/dL (1-2 hours)      Critically ill patients:  140 - 180 mg/dL    Latest Reference Range & Units 09/19/23 07:42 09/19/23 11:26 09/19/23 15:50 09/19/23 19:57 09/20/23 00:04 09/20/23 04:05 09/20/23 07:25  Glucose-Capillary 70 - 99 mg/dL 792 (H) 742 (H) 785 (H) 221 (H) 219 (H) 224 (H) 232 (H)   Review of Glycemic Control  Diabetes history: DM2 Outpatient Diabetes medications: Jardiance  10 mg daily, Metformin  750 mg BID Current orders for Inpatient glycemic control: Novolog  0-15 units Q4H, Novolog  3 units Q4H; Osmolite @ 50 ml/hr  Inpatient Diabetes Program Recommendations:    Insulin : Please consider ordering Semglee  8 units BID and increase Novolog  tube feeding coverage to Novolog  6 units Q4H.  Thanks, Earnie Gainer, RN, MSN, CDCES Diabetes Coordinator Inpatient Diabetes Program [phone] (Team Pager from 8am to 5pm)

## 2023-09-20 NOTE — Plan of Care (Signed)
   Problem: Fluid Volume: Goal: Ability to maintain a balanced intake and output will improve Outcome: Progressing   Problem: Nutritional: Goal: Maintenance of adequate nutrition will improve Outcome: Progressing

## 2023-09-21 DIAGNOSIS — J189 Pneumonia, unspecified organism: Secondary | ICD-10-CM | POA: Diagnosis not present

## 2023-09-21 DIAGNOSIS — J9621 Acute and chronic respiratory failure with hypoxia: Secondary | ICD-10-CM | POA: Diagnosis not present

## 2023-09-21 DIAGNOSIS — B964 Proteus (mirabilis) (morganii) as the cause of diseases classified elsewhere: Secondary | ICD-10-CM | POA: Diagnosis not present

## 2023-09-21 LAB — GLUCOSE, CAPILLARY
Glucose-Capillary: 239 mg/dL — ABNORMAL HIGH (ref 70–99)
Glucose-Capillary: 166 mg/dL — ABNORMAL HIGH (ref 70–99)
Glucose-Capillary: 229 mg/dL — ABNORMAL HIGH (ref 70–99)
Glucose-Capillary: 182 mg/dL — ABNORMAL HIGH (ref 70–99)
Glucose-Capillary: 199 mg/dL — ABNORMAL HIGH (ref 70–99)

## 2023-09-21 MED ORDER — INSULIN GLARGINE-YFGN 100 UNIT/ML ~~LOC~~ SOLN
15.0000 [IU] | Freq: Two times a day (BID) | SUBCUTANEOUS | Status: DC
  Administered 2023-09-21 – 2023-09-23 (×5): 15 [IU] via SUBCUTANEOUS
  Filled 2023-09-21 (×7): qty 0.15

## 2023-09-21 MED ORDER — SPIRONOLACTONE 25 MG PO TABS
25.0000 mg | ORAL_TABLET | Freq: Every day | ORAL | Status: DC
  Administered 2023-09-21 – 2023-09-23 (×3): 25 mg
  Filled 2023-09-21 (×3): qty 1

## 2023-09-21 MED ORDER — SODIUM CHLORIDE 0.9 % IV SOLN
2.0000 g | Freq: Three times a day (TID) | INTRAVENOUS | Status: DC
  Administered 2023-09-21 – 2023-09-24 (×10): 2 g via INTRAVENOUS
  Filled 2023-09-21 (×10): qty 12.5

## 2023-09-21 MED ORDER — SPIRONOLACTONE 5 MG/ML ORAL SUSPENSION: 25.0000 mg | Freq: Every day | ORAL | Status: DC

## 2023-09-21 MED ORDER — CARVEDILOL 3.125 MG PO TABS
3.1250 mg | ORAL_TABLET | Freq: Two times a day (BID) | ORAL | Status: DC
  Administered 2023-09-21 – 2023-09-23 (×4): 3.125 mg
  Filled 2023-09-21 (×5): qty 1

## 2023-09-21 MED ORDER — INSULIN ASPART 100 UNIT/ML IJ SOLN
5.0000 [IU] | INTRAMUSCULAR | Status: DC
  Administered 2023-09-21 – 2023-09-22 (×5): 5 [IU] via SUBCUTANEOUS

## 2023-09-21 NOTE — Progress Notes (Addendum)
 NAME:  Becky Hendricks, MRN:  [id number], DOB:  03/28/1946, LOS: 9 ADMISSION DATE:  09/12/2023 CONSULTATION DATE:  09/12/2023 REFERRING MD:  Patt - EDP, CHIEF COMPLAINT:  Unresponsiveness   History of Present Illness:  78 year old woman who presented to [facility] ED 1/30 after she was found unresponsive in her wheelchair at her facility with reported SpO2 75%. PMHx significant for HTN, HLD, PAF, prior cardiac arrest, AoC respiratory failure with hypoxia/hypercapnia (on 3L HOT), T2DM, osteomyelitis, C. Diff, bipolar disorder, functional quadriplegia. Recent presentation to [facility] ED 07/19/2023 for episode of unresponsiveness. Admitted with COVID 03/2023.  History is obtained primarily from chart review. Per EMS, patient was found unresponsive in her wheelchair at her facility with SpO2 75%. Placed on NRB and intubated on ED arrival. Labs were notable for WBC 11.3, Hgb 13.5, Plt 145. Na 141, K 4.8, CO2 32, Cr 0.63 (baseline), LFTs WNL. VBG 7.344/pCO2 65.9/bicarb 35.9. LA 0.8. VPA level WNL. Post-intubation ABG 7.384/pCO2 53.6/pO2 293/bicarb 32. CXR with low lung volumes, no active disease. AXR nonobstructive bowel gas pattern. CT Head NAICA.  PCCM consulted for ICU admission.  Pertinent Medical History:   Past Medical History:  Diagnosis Date   Acute on chronic respiratory failure with hypoxia and hypercapnia (HCC)    Acute respiratory failure (HCC) 03/29/2021   Atrial fibrillation (HCC)    Bipolar disorder (HCC) 04/27/2020   Cardiac arrest (HCC)    Clostridium difficile diarrhea 08/02/2019   Colitis 03/29/2021   Dehydration    Drug rash    Encephalopathy acute 11/18/2020   Essential hypertension    Fall 02/11/2020   Hypoxia    Morbid obesity (HCC)    Nausea and vomiting 11/17/2020   Osteomyelitis (HCC) 12/03/2019   Pneumonia due to COVID-19 virus 09/15/2019   Pneumonia of lower lobe due to infectious organism 03/29/2021   Severe sepsis (HCC) 12/03/2019   SIRS (systemic inflammatory response  syndrome) (HCC) 11/17/2020   Type 2 diabetes mellitus (HCC)    Urinary tract infection without hematuria 09/15/2019   Weakness    Significant Hospital Events: Including procedures, antibiotic start and stop dates in addition to other pertinent events   1/30 - Presented from facility after being found unresponsive in wheelchair hypoxic to 75%. Intubated in ED. 2/1 reintubated after aspiration event from dinner 2/5 s/p trach 2/7 Tolerated PS for a few hours yesterday then had heavy secretions, sweating requiring PRVC again. 1 x fever yesterday am before starting abx (ceftriaxone ) 2/8 awake. Looks comfortable on PSV  Interim History / Subjective:  Appears comfortable on pressure support secretions seem to improve some Objective:  Blood pressure (!) 97/50, pulse (!) 53, temperature 98.3 F (36.8 C), temperature source Axillary, resp. rate 17, height 5' 6 (1.676 m), weight 88.1 kg, SpO2 99%.    Vent Mode: PSV;CPAP FiO2 (%):  [30 %-40 %] 30 % Set Rate:  [16 bmp] 16 bmp Vt Set:  [400 mL] 400 mL PEEP:  [5 cmH20] 5 cmH20 Pressure Support:  [12 cmH20] 12 cmH20 Plateau Pressure:  [16 cmH20-20 cmH20] 20 cmH20   Intake/Output Summary (Last 24 hours) at 09/21/2023 0745 Last data filed at 09/21/2023 0601 Gross per 24 hour  Intake 1180.69 ml  Output 602 ml  Net 578.69 ml   Filed Weights   09/17/23 0630 09/19/23 0500 09/20/23 9297  Weight: 89.3 kg 86.8 kg 88.1 kg   Physical Examination: General This is a pleasant 78 year old female she is profoundly weak and debilitated currently on pressure support and in no acute distress HEENT  size 6 tracheostomy is midline no significant tracheal secretions today pupils equal reactive Pulmonary: Coarse scattered rhonchi currently on pressure of 4-12 with tidal volumes in the 500 range Cardiac: Regular rate and rhythm Abdomen soft nontender Extremities bilateral foot drop, no edema Neuro chronic tremor, nods and communicates appropriately, moves all  extremities but diffusely weak GU clear yellow   Assessment & Plan:  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: Due to untreated OHS , recurrent aspiration; RSV infection and polypharmacy in setting of overall background is FTT recurrent admissions and ambulatory failure Now trach dep (2/5) -extubated 1/31 and reintubated on 2/1; Trached 2/5 Plan Continue PSV as tolerated VAP bundle Minimize sedation Work towards trach collar trial Will need LTAC  Proteus HCAP, sensitive to ceftriaxone  Plan Day #3 of 7 ceftriaxone  Will get chest x-ray in the morning  ICU delirium/encephalopathy superimposed on bipolar disorder  -on-going and intermittent, today appears appropriate Plan Continue Risperdal  via tube at at bedtime Continue valproic  acid twice daily Continue Provigil  daily Needs physical therapy  H/o,Paroxysmal Afib, HFpEF, HTN and HLD Plan Continue telemetry monitoring Continuing therapeutic Lovenox , hold off on resuming DOAC until after PEG Continue Lipitor via tube Continue Coreg  via tube Resume spironolactone   T2DM Plan Continue sliding scale insulin  Goal glucose 140-180Continue tube feed coverage 3 units of NovoLog  every 4 hours continue Semglee  10 units twice daily  Functional Quad Plan  Dysphagia Plan Continue tube feeds IR consult for PEG   Best Practice (right click and Reselect all SmartList Selections daily)   Diet/type: tubefeeds DVT prophylaxis: SCDs Start: 09/12/23 1731 and therapeutic Lovenox    Pressure ulcers present: N/A GI prophylaxis: H2B Lines: N/A Foley:  N/A Code Status:  full code Last date of multidisciplinary goals of care discussion [pending]   My critical care time is 32 minutes

## 2023-09-21 NOTE — Plan of Care (Signed)
  Problem: Education: Goal: Ability to describe self-care measures that may prevent or decrease complications (Diabetes Survival Skills Education) will improve Outcome: Progressing Goal: Individualized Educational Video(s) Outcome: Progressing   Problem: Coping: Goal: Ability to adjust to condition or change in health will improve Outcome: Progressing   Problem: Fluid Volume: Goal: Ability to maintain a balanced intake and output will improve Outcome: Progressing   Problem: Health Behavior/Discharge Planning: Goal: Ability to identify and utilize available resources and services will improve Outcome: Progressing Goal: Ability to manage health-related needs will improve Outcome: Progressing   Problem: Metabolic: Goal: Ability to maintain appropriate glucose levels will improve Outcome: Progressing   Problem: Nutritional: Goal: Maintenance of adequate nutrition will improve Outcome: Progressing Goal: Progress toward achieving an optimal weight will improve Outcome: Progressing   Problem: Skin Integrity: Goal: Risk for impaired skin integrity will decrease Outcome: Progressing   Problem: Tissue Perfusion: Goal: Adequacy of tissue perfusion will improve Outcome: Progressing   Problem: Education: Goal: Knowledge of General Education information will improve Description: Including pain rating scale, medication(s)/side effects and non-pharmacologic comfort measures Outcome: Progressing   Problem: Health Behavior/Discharge Planning: Goal: Ability to manage health-related needs will improve Outcome: Progressing   Problem: Clinical Measurements: Goal: Ability to maintain clinical measurements within normal limits will improve Outcome: Progressing Goal: Will remain free from infection Outcome: Progressing Goal: Diagnostic test results will improve Outcome: Progressing Goal: Respiratory complications will improve Outcome: Progressing Goal: Cardiovascular complication will  be avoided Outcome: Progressing   Problem: Activity: Goal: Risk for activity intolerance will decrease Outcome: Progressing   Problem: Nutrition: Goal: Adequate nutrition will be maintained Outcome: Progressing   Problem: Coping: Goal: Level of anxiety will decrease Outcome: Progressing   Problem: Elimination: Goal: Will not experience complications related to bowel motility Outcome: Progressing Goal: Will not experience complications related to urinary retention Outcome: Progressing   Problem: Pain Managment: Goal: General experience of comfort will improve and/or be controlled Outcome: Progressing   Problem: Safety: Goal: Ability to remain free from injury will improve Outcome: Progressing   Problem: Skin Integrity: Goal: Risk for impaired skin integrity will decrease Outcome: Progressing   Problem: Safety: Goal: Non-violent Restraint(s) Outcome: Progressing   Problem: Activity: Goal: Ability to tolerate increased activity will improve Outcome: Progressing   Problem: Respiratory: Goal: Ability to maintain a clear airway and adequate ventilation will improve Outcome: Progressing   Problem: Role Relationship: Goal: Method of communication will improve Outcome: Progressing   Problem: Education: Goal: Knowledge about tracheostomy care/management will improve Outcome: Progressing   Problem: Activity: Goal: Ability to tolerate increased activity will improve Outcome: Progressing   Problem: Health Behavior/Discharge Planning: Goal: Ability to manage tracheostomy will improve Outcome: Progressing   Problem: Respiratory: Goal: Patent airway maintenance will improve Outcome: Progressing   Problem: Role Relationship: Goal: Ability to communicate will improve Outcome: Progressing

## 2023-09-21 NOTE — Progress Notes (Signed)
 NAME:  Becky Hendricks, MRN:  [id number], DOB:  [birth year], LOS: 9 ADMISSION DATE:  09/12/2023 CONSULTATION DATE:  09/12/2023 REFERRING MD:  Patt - EDP, CHIEF COMPLAINT:  Unresponsiveness   History of Present Illness:  78 year old woman who presented to [facility] ED 1/30 after she was found unresponsive in her wheelchair at her facility with reported SpO2 75%. PMHx significant for HTN, HLD, PAF, prior cardiac arrest, AoC respiratory failure with hypoxia/hypercapnia (on 3L HOT), T2DM, osteomyelitis, C. Diff, bipolar disorder, functional quadriplegia. Recent presentation to [facility] ED 07/19/2023 for episode of unresponsiveness. Admitted with COVID 03/2023.  History is obtained primarily from chart review. Per EMS, patient was found unresponsive in her wheelchair at her facility with SpO2 75%. Placed on NRB and intubated on ED arrival. Labs were notable for WBC 11.3, Hgb 13.5, Plt 145. Na 141, K 4.8, CO2 32, Cr 0.63 (baseline), LFTs WNL. VBG 7.344/pCO2 65.9/bicarb 35.9. LA 0.8. VPA level WNL. Post-intubation ABG 7.384/pCO2 53.6/pO2 293/bicarb 32. CXR with low lung volumes, no active disease. AXR nonobstructive bowel gas pattern. CT Head NAICA.  PCCM consulted for ICU admission.  Pertinent Medical History:   Past Medical History:  Diagnosis Date   Acute on chronic respiratory failure with hypoxia and hypercapnia (HCC)    Acute respiratory failure (HCC) 03/29/2021   Atrial fibrillation (HCC)    Bipolar disorder (HCC) 04/27/2020   Cardiac arrest (HCC)    Clostridium difficile diarrhea 08/02/2019   Colitis 03/29/2021   Dehydration    Drug rash    Encephalopathy acute 11/18/2020   Essential hypertension    Fall 02/11/2020   Hypoxia    Morbid obesity (HCC)    Nausea and vomiting 11/17/2020   Osteomyelitis (HCC) 12/03/2019   Pneumonia due to COVID-19 virus 09/15/2019   Pneumonia of lower lobe due to infectious organism 03/29/2021   Severe sepsis (HCC) 12/03/2019   SIRS (systemic inflammatory response  syndrome) (HCC) 11/17/2020   Type 2 diabetes mellitus (HCC)    Urinary tract infection without hematuria 09/15/2019   Weakness    Significant Hospital Events: Including procedures, antibiotic start and stop dates in addition to other pertinent events   1/30 - Presented from facility after being found unresponsive in wheelchair hypoxic to 75%. Intubated in ED. 2/1 reintubated after aspiration event from dinner 2/5 s/p trach 2/7 Tolerated PS for a few hours yesterday then had heavy secretions, sweating requiring PRVC again. 1 x fever yesterday am before starting abx (ceftriaxone ) 2/8 awake. Resp culture also growing PSA so abx changed to cefepime . Looks comfortable on PSV but got agitated at HS req'd freq PRNs 2/9 added HS melatonin. Cont to work on weaning. Further adjustment of glycemic control   Interim History / Subjective:  Laying in bed. No distress Objective:  Blood pressure (!) 143/95, pulse 90, temperature 99.7 F (37.6 C), temperature source Oral, resp. rate (!) 25, height 5' 6 (1.676 m), weight 88.1 kg, SpO2 96%.    Vent Mode: PSV;CPAP FiO2 (%):  [30 %-40 %] 30 % Set Rate:  [16 bmp] 16 bmp Vt Set:  [400 mL] 400 mL PEEP:  [5 cmH20] 5 cmH20 Pressure Support:  [12 cmH20] 12 cmH20 Plateau Pressure:  [16 cmH20-20 cmH20] 20 cmH20   Intake/Output Summary (Last 24 hours) at 09/21/2023 1606 Last data filed at 09/21/2023 1400 Gross per 24 hour  Intake 1197.4 ml  Output 302 ml  Net 895.4 ml   Filed Weights   09/17/23 0630 09/19/23 0500 09/20/23 0702  Weight: 89.3 kg 86.8 kg 88.1  kg   Physical Examination:  General laying in bed no distress HENT NCAT 6 trach is midline still w/ intermittent thick secretions PERRL Pulm coarse bilateral breath sounds currently on full support. No accessory use. PCXR trach good position, R sided elevated HD. R>L airspace disease  Card rrr Abd soft Ext diffuse ecchymosis dependent edema pulses are strong Neuro intention tremor. Follows commands  tries to communicate. Right hand contracted. Moves all ext but left side weaker, although she is generally weak and has bilateral foot drop   Assessment & Plan:  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: initially Due to untreated OHS, recurrent aspiration; RSV infection and polypharmacy in setting of overall background is FTT recurrent admissions and ambulatory failure Now trach dep (2/5) -extubated 1/31 and reintubated on 2/1; Trached 2/5 Plan Continue PSV as tolerated, and assess for ATC daily. Think we should be able to liberate her from vent VAP bundle Minimize sedation  Proteus and Pseudomonas HCAP Abx widened to cover for PSA on 2/8 Plan Day 2 of 7 cefepime    ICU delirium/encephalopathy superimposed on bipolar disorder  -on-going and intermittent, today appears appropriate Plan Continue Risperdal  via tube at at bedtime Continue valproic  acid twice daily Continue Provigil  daily Delirium precautions  Will add melatonin at HS as well. If continues to have night time delirium might need to consider change in abx (in case cefepime  contributing to this)  H/o,Paroxysmal Afib, HFpEF, HTN and HLD Plan Continue telemetry monitoring Continuing therapeutic Lovenox , hold off on resuming DOAC until after PEG Continue Lipitor via tube, Coreg  via tube Resumed spironolactone  on 2/8 will continue   T2DM Plan Continue sliding scale insulinGoal glucose 140-180 tube feed coverage increased to  5 units of NovoLog  every 4 hours on 2/8, still not w/in goal will inc to 8 units q 4 continue Semglee  15 bid  (hold off on inc'd basal as will eventually be NPO for PEG)  Functional Quad Plan Supportive care  Needs LTAC vs SNF  Dysphagia Plan Continue tube feeds IR consult for PEG   Best Practice (right click and Reselect all SmartList Selections daily)   Diet/type: tubefeeds DVT prophylaxis: SCDs Start: 09/12/23 1731 and therapeutic Lovenox    Pressure ulcers present:  N/A GI prophylaxis: H2B Lines: N/A Foley:  N/A Code Status:  full code Last date of multidisciplinary goals of care discussion [pending]   My critical care time is 32 min

## 2023-09-22 ENCOUNTER — Inpatient Hospital Stay (HOSPITAL_COMMUNITY): Payer: Medicare Other

## 2023-09-22 DIAGNOSIS — J189 Pneumonia, unspecified organism: Secondary | ICD-10-CM | POA: Diagnosis not present

## 2023-09-22 DIAGNOSIS — J9621 Acute and chronic respiratory failure with hypoxia: Secondary | ICD-10-CM | POA: Diagnosis not present

## 2023-09-22 DIAGNOSIS — B964 Proteus (mirabilis) (morganii) as the cause of diseases classified elsewhere: Secondary | ICD-10-CM | POA: Diagnosis not present

## 2023-09-22 DIAGNOSIS — E119 Type 2 diabetes mellitus without complications: Secondary | ICD-10-CM | POA: Diagnosis not present

## 2023-09-22 LAB — MAGNESIUM: Magnesium: 2.2 mg/dL (ref 1.7–2.4)

## 2023-09-22 LAB — CBC
HCT: 32 % — ABNORMAL LOW (ref 36.0–46.0)
Hemoglobin: 10 g/dL — ABNORMAL LOW (ref 12.0–15.0)
MCH: 30.6 pg (ref 26.0–34.0)
MCHC: 31.3 g/dL (ref 30.0–36.0)
MCV: 97.9 fL (ref 80.0–100.0)
Platelets: 276 10*3/uL (ref 150–400)
RBC: 3.27 MIL/uL — ABNORMAL LOW (ref 3.87–5.11)
RDW: 14.3 % (ref 11.5–15.5)
WBC: 10.5 10*3/uL (ref 4.0–10.5)
nRBC: 0.2 % (ref 0.0–0.2)

## 2023-09-22 LAB — COMPREHENSIVE METABOLIC PANEL
ALT: 60 U/L — ABNORMAL HIGH (ref 0–44)
AST: 39 U/L (ref 15–41)
Albumin: 1.6 g/dL — ABNORMAL LOW (ref 3.5–5.0)
Alkaline Phosphatase: 41 U/L (ref 38–126)
Anion gap: 9 (ref 5–15)
BUN: 28 mg/dL — ABNORMAL HIGH (ref 8–23)
CO2: 30 mmol/L (ref 22–32)
Calcium: 8.5 mg/dL — ABNORMAL LOW (ref 8.9–10.3)
Chloride: 101 mmol/L (ref 98–111)
Creatinine, Ser: 0.52 mg/dL (ref 0.44–1.00)
GFR, Estimated: >60 mL/min (ref >60)
Glucose, Bld: 207 mg/dL — ABNORMAL HIGH (ref 70–99)
Potassium: 4.1 mmol/L (ref 3.5–5.1)
Sodium: 140 mmol/L (ref 135–145)
Total Bilirubin: 0.6 mg/dL (ref 0.0–1.2)
Total Protein: 6.4 g/dL — ABNORMAL LOW (ref 6.5–8.1)

## 2023-09-22 LAB — GLUCOSE, CAPILLARY
Glucose-Capillary: 148 mg/dL — ABNORMAL HIGH (ref 70–99)
Glucose-Capillary: 162 mg/dL — ABNORMAL HIGH (ref 70–99)
Glucose-Capillary: 172 mg/dL — ABNORMAL HIGH (ref 70–99)
Glucose-Capillary: 175 mg/dL — ABNORMAL HIGH (ref 70–99)
Glucose-Capillary: 202 mg/dL — ABNORMAL HIGH (ref 70–99)
Glucose-Capillary: 216 mg/dL — ABNORMAL HIGH (ref 70–99)
Glucose-Capillary: 223 mg/dL — ABNORMAL HIGH (ref 70–99)

## 2023-09-22 MED ORDER — MELATONIN 3 MG PO TABS
3.0000 mg | ORAL_TABLET | Freq: Every day | ORAL | Status: DC
  Administered 2023-09-22 – 2023-09-23 (×2): 3 mg
  Filled 2023-09-22 (×2): qty 1

## 2023-09-22 MED ORDER — INSULIN ASPART 100 UNIT/ML IJ SOLN
8.0000 [IU] | INTRAMUSCULAR | Status: DC
  Administered 2023-09-22 – 2023-09-23 (×9): 8 [IU] via SUBCUTANEOUS

## 2023-09-22 NOTE — Plan of Care (Signed)
  Problem: Education: Goal: Ability to describe self-care measures that may prevent or decrease complications (Diabetes Survival Skills Education) will improve Outcome: Progressing Goal: Individualized Educational Video(s) Outcome: Progressing   Problem: Coping: Goal: Ability to adjust to condition or change in health will improve Outcome: Progressing   Problem: Fluid Volume: Goal: Ability to maintain a balanced intake and output will improve Outcome: Progressing   Problem: Health Behavior/Discharge Planning: Goal: Ability to identify and utilize available resources and services will improve Outcome: Progressing Goal: Ability to manage health-related needs will improve Outcome: Progressing   Problem: Metabolic: Goal: Ability to maintain appropriate glucose levels will improve Outcome: Progressing   Problem: Nutritional: Goal: Maintenance of adequate nutrition will improve Outcome: Progressing Goal: Progress toward achieving an optimal weight will improve Outcome: Progressing   Problem: Skin Integrity: Goal: Risk for impaired skin integrity will decrease Outcome: Progressing   Problem: Tissue Perfusion: Goal: Adequacy of tissue perfusion will improve Outcome: Progressing   Problem: Education: Goal: Knowledge of General Education information will improve Description: Including pain rating scale, medication(s)/side effects and non-pharmacologic comfort measures Outcome: Progressing   Problem: Health Behavior/Discharge Planning: Goal: Ability to manage health-related needs will improve Outcome: Progressing   Problem: Clinical Measurements: Goal: Ability to maintain clinical measurements within normal limits will improve Outcome: Progressing Goal: Will remain free from infection Outcome: Progressing Goal: Diagnostic test results will improve Outcome: Progressing Goal: Respiratory complications will improve Outcome: Progressing Goal: Cardiovascular complication will  be avoided Outcome: Progressing   Problem: Activity: Goal: Risk for activity intolerance will decrease Outcome: Progressing   Problem: Nutrition: Goal: Adequate nutrition will be maintained Outcome: Progressing   Problem: Coping: Goal: Level of anxiety will decrease Outcome: Progressing   Problem: Elimination: Goal: Will not experience complications related to bowel motility Outcome: Progressing Goal: Will not experience complications related to urinary retention Outcome: Progressing   Problem: Pain Managment: Goal: General experience of comfort will improve and/or be controlled Outcome: Progressing   Problem: Safety: Goal: Ability to remain free from injury will improve Outcome: Progressing   Problem: Skin Integrity: Goal: Risk for impaired skin integrity will decrease Outcome: Progressing   Problem: Safety: Goal: Non-violent Restraint(s) Outcome: Progressing   Problem: Activity: Goal: Ability to tolerate increased activity will improve Outcome: Progressing   Problem: Respiratory: Goal: Ability to maintain a clear airway and adequate ventilation will improve Outcome: Progressing   Problem: Role Relationship: Goal: Method of communication will improve Outcome: Progressing   Problem: Education: Goal: Knowledge about tracheostomy care/management will improve Outcome: Progressing   Problem: Activity: Goal: Ability to tolerate increased activity will improve Outcome: Progressing   Problem: Health Behavior/Discharge Planning: Goal: Ability to manage tracheostomy will improve Outcome: Progressing   Problem: Respiratory: Goal: Patent airway maintenance will improve Outcome: Progressing   Problem: Role Relationship: Goal: Ability to communicate will improve Outcome: Progressing

## 2023-09-23 ENCOUNTER — Encounter (HOSPITAL_COMMUNITY): Payer: Self-pay | Admitting: Pulmonary Disease

## 2023-09-23 DIAGNOSIS — J9601 Acute respiratory failure with hypoxia: Secondary | ICD-10-CM | POA: Diagnosis not present

## 2023-09-23 DIAGNOSIS — E119 Type 2 diabetes mellitus without complications: Secondary | ICD-10-CM | POA: Diagnosis not present

## 2023-09-23 LAB — GLUCOSE, CAPILLARY
Glucose-Capillary: 145 mg/dL — ABNORMAL HIGH (ref 70–99)
Glucose-Capillary: 147 mg/dL — ABNORMAL HIGH (ref 70–99)
Glucose-Capillary: 150 mg/dL — ABNORMAL HIGH (ref 70–99)
Glucose-Capillary: 156 mg/dL — ABNORMAL HIGH (ref 70–99)
Glucose-Capillary: 184 mg/dL — ABNORMAL HIGH (ref 70–99)
Glucose-Capillary: 202 mg/dL — ABNORMAL HIGH (ref 70–99)

## 2023-09-23 MED ORDER — CEFAZOLIN SODIUM-DEXTROSE 1-4 GM/50ML-% IV SOLN
1.0000 g | Freq: Once | INTRAVENOUS | Status: AC
  Administered 2023-09-24: 1 g via INTRAVENOUS
  Filled 2023-09-23: qty 50

## 2023-09-23 MED ORDER — ACETAMINOPHEN 160 MG/5ML PO SOLN
650.0000 mg | Freq: Four times a day (QID) | ORAL | Status: DC | PRN
  Administered 2023-09-23: 650 mg via ORAL
  Filled 2023-09-23: qty 20.3

## 2023-09-23 NOTE — Progress Notes (Signed)
 Nutrition Follow-up  DOCUMENTATION CODES:   Not applicable  INTERVENTION:  TF via Cortrak: Osmolite 1.5 at 50ml/hr (1200ml per day) 200ml free water  flushes q8h ( total per day)- per CCM 60ml ProSource TF20 once daily Provides 1880 kcal, 95g protein and free water  flushes ( total daily-TF+FWF) Juven BID to support wound healing  NUTRITION DIAGNOSIS:   Inadequate oral intake related to acute illness as evidenced by NPO status. - remains applicable  GOAL:   Patient will meet greater than or equal to 90% of their needs - goal met via TF  MONITOR:   Vent status, Labs, Weight trends, TF tolerance, Skin  REASON FOR ASSESSMENT:   Consult Enteral/tube feeding initiation and management (trickle start.)  ASSESSMENT:   Pt admitted from SNF after being found unresponsive in her wheelchair. PMH significant for HTN, HLD, PAF, cardiac arrest, AoC respiratory failure with hypoxia/hypercapnia, DM2, osteomyelitis, c diff, functional quadriplegia. Recently presented to [facility] ED 07/19/23 for episode of unresponsiveness. Admitted 03/2023 with COVID.  1/30: admitted; intubated 1/31: extubated 2/1: re-intubated after aspiration event from dinner 2/5: trach placement; Cortrak placed  Patient is currently intubated on ventilator support MV: 10.3 L/min Temp (24hrs), Avg:99.3 F (37.4 C), Min:98.4 F (36.9 C), Max:100.5 F (38.1 C)  Assessed at bedside. TF infusing at goal rate via Cortrak. Continues to tolerate current regimen without n/v/diarrhea.   Pt discussed in IDT rounds.  Plans for PEG tube placement tomorrow with IR.  Will discharge to [facility] following procedure, pending insurance auth.   Admit weight: 88.1 kg Current weight: 88.8 kg  Drains/lines: UOP: x24 hours Cortrak  Medications: pepcid , SSI 0-15 units q4h, SSI 8 units q4h, semglee  15 units BID, sorbitol , IV abx  Labs:  CBG's 147-202 x24 hours  Diet Order:   Diet Order             Diet NPO  time specified  Diet effective now                   EDUCATION NEEDS:   No education needs have been identified at this time  Skin:  Skin Assessment: Skin Integrity Issues: Skin Integrity Issues:: Stage III Stage III: sacrum  Last BM:  2/10 type 7 large via rectal tube  Height:   Ht Readings from Last 1 Encounters:  09/13/23 5\' 6"  (1.676 m)    Weight:   Wt Readings from Last 1 Encounters:  09/23/23 88.8 kg    Ideal Body Weight:  59.1 kg  BMI:  Body mass index is 31.6 kg/m.  Estimated Nutritional Needs:   Kcal:  1700-1900  Protein:  95-110g  Fluid:  >/=1.7L  Rocklin Chute, RDN, LDN Clinical Nutrition

## 2023-09-23 NOTE — H&P (Addendum)
 Chief Complaint: dysphagia - image guided gastrostomy tube placement   Referring Provider(s): Felton Hough   Supervising Physician: Art Largo  Patient Status: [facility] - In-pt  History of Present Illness: Aleksia Alcocer is a 78 y.o. female with a history of acute encephalopathy, bipolar disorder, respiratory viral hypoxia and hypercapnia, atrial fibrillation, type 2 diabetes, paraplegia, multiple admissions for respiratory failure.  Pt presented to ED on 09/12/23 with respiratory arrest - pt was immediately intubated and admitted to critical care.  Pt was extubated on 1/31 and re-intubated on 09/14/23; pt was then trached on 09/18/23.  Pt is RSV + and has ongiong intermittent encephalopathy with MRI 09/17/23 showing no acute findings.  Pt is currently on ventilation and interventional radiology was consulted for image guided gastrostomy tube.    Patient is Full Code  Past Medical History:  Diagnosis Date   Acute on chronic respiratory failure with hypoxia and hypercapnia (HCC)    Acute respiratory failure (HCC) 03/29/2021   Atrial fibrillation (HCC)    Bipolar disorder (HCC) 04/27/2020   Cardiac arrest (HCC)    Clostridium difficile diarrhea 08/02/2019   Colitis 03/29/2021   Dehydration    Drug rash    Encephalopathy acute 11/18/2020   Essential hypertension    Fall 02/11/2020   Hypoxia    Morbid obesity (HCC)    Nausea and vomiting 11/17/2020   Osteomyelitis (HCC) 12/03/2019   Pneumonia due to COVID-19 virus 09/15/2019   Pneumonia of lower lobe due to infectious organism 03/29/2021   Severe sepsis (HCC) 12/03/2019   SIRS (systemic inflammatory response syndrome) (HCC) 11/17/2020   Type 2 diabetes mellitus (HCC)    Urinary tract infection without hematuria 09/15/2019   Weakness     Past Surgical History:  Procedure Laterality Date   INCISION AND DRAINAGE PERIRECTAL ABSCESS Left 12/04/2019   Procedure: IRRIGATION AND DEBRIDEMENT LEFT BUTTOCK ABSCESS;  Surgeon: Dorena Gander,  MD;  Location: [facility] OR;  Service: General;  Laterality: Left;   INCISION AND DRAINAGE PERIRECTAL ABSCESS Left 12/10/2019   Procedure: REPEAT IRRIGATION AND DEBRIDEMENT BUTTOCK  ABSCESS;  Surgeon: Oza Blumenthal, MD;  Location: [facility] OR;  Service: General;  Laterality: Left;   IR FLUORO GUIDE CV LINE RIGHT  11/25/2020   IR RADIOLOGIST EVAL & MGMT  12/29/2020   IR US  GUIDE VASC ACCESS RIGHT  11/25/2020    Allergies: Firvanq  [vancomycin ], Hibiclens  [chlorhexidine  gluconate], and Diamox  [acetazolamide ]  Medications: Prior to Admission medications   Medication Sig Start Date End Date Taking? Authorizing Provider  Amino Acids -Protein Hydrolys (PRO-STAT AWC) LIQD Take 30 mLs by mouth 2 (two) times daily between meals.   Yes [provider]  apixaban  (ELIQUIS ) 5 MG TABS tablet Take 5 mg by mouth 2 (two) times daily.   Yes [provider]  atorvastatin  (LIPITOR) 10 MG tablet Take 10 mg by mouth at bedtime.   Yes [provider]  calcium  carbonate (TUMS - DOSED IN MG ELEMENTAL CALCIUM ) 500 MG chewable tablet Chew 500 mg by mouth daily.   Yes [provider]  carvedilol  (COREG ) 3.125 MG tablet Take 1 tablet (3.125 mg total) by mouth 2 (two) times daily with a meal. 09/08/22 10/19/23 Yes Pahwani, Martha Slack, MD  divalproex  (DEPAKOTE ) 125 MG DR tablet Take 125 mg by mouth daily at 2 PM.   Yes [provider]  divalproex  (DEPAKOTE ) 500 MG DR tablet Take 500 mg by mouth 2 (two) times daily.   Yes [provider]  Emollient (EUCERIN SKIN CALMING) CREA Apply  1 application  topically at bedtime. To arms and dry skin   Yes [provider]  empagliflozin  (JARDIANCE ) 10 MG TABS tablet Take 10 mg by mouth daily.   Yes [provider]  ergocalciferol  (VITAMIN D2) 1.25 MG (50000 UT) capsule Take 50,000 Units by mouth See admin instructions. 50,000 units once daily in the morning on Tuesday, Friday.   Yes [provider]  Ipratropium-Albuterol   (COMBIVENT  RESPIMAT) 20-100 MCG/ACT AERS respimat Inhale 1 puff into the lungs every 12 (twelve) hours.   Yes [provider]  LORazepam  (ATIVAN ) 0.5 MG tablet Take 1 tablet (0.5 mg total) by mouth daily. Take 0.5mg  by mouth once a day in addition to twice daily as needed for agitation or anxiety. Patient taking differently: Take 0.5 mg by mouth daily. 04/12/23  Yes Faith Homes, MD  metFORMIN  (GLUCOPHAGE -XR) 750 MG 24 hr tablet Take 750 mg by mouth 2 (two) times daily with a meal.   Yes [provider]  methocarbamol  (ROBAXIN ) 500 MG tablet Take 500 mg by mouth 2 (two) times daily.   Yes [provider]  miconazole  nitrate (MICATIN) POWD Apply 1 Application topically 2 (two) times daily between meals. Wash and dry peri/groin, inner thighs and buttocks twice daily for yeast then dust with powder.   Yes [provider]  OXYGEN  Inhale 1-5 L/min into the lungs continuous. May titrate to keep sats > 92%   Yes [provider]  PSYLLIUM HUSK PO Take 0.4 g by mouth 2 (two) times daily.   Yes [provider]  risperiDONE  (RISPERDAL ) 2 MG tablet Take 2 mg by mouth daily.   Yes [provider]  risperiDONE  (RISPERDAL ) 3 MG tablet Take 3 mg by mouth at bedtime. 11/09/21  Yes [provider]  spironolactone  (ALDACTONE ) 25 MG tablet Take 12.5 mg by mouth daily. 11/27/21  Yes [provider]  tamsulosin  (FLOMAX ) 0.4 MG CAPS capsule Take 1 capsule (0.4 mg total) by mouth every other day. 04/20/21  Yes Montey Apa, DO     Family History  Family history unknown: Yes    Social History   Socioeconomic History   Marital status: Single    Spouse name: Not on file   Number of children: Not on file   Years of education: Not on file   Highest education level: Not on file  Occupational History   Not on file  Tobacco Use   Smoking status: Never   Smokeless tobacco: Never  Vaping Use   Vaping status: Unknown  Substance and  Sexual Activity   Alcohol use: Not Currently   Drug use: Not Currently   Sexual activity: Not Currently    Birth control/protection: Post-menopausal  Other Topics Concern   Not on file  Social History Narrative   Not on file   Social Drivers of Health   Financial Resource Strain: Not on file  Food Insecurity: Patient Unable To Answer (09/15/2023)   Hunger Vital Sign    Worried About Running Out of Food in the Last Year: Patient unable to answer    Ran Out of Food in the Last Year: Patient unable to answer  Transportation Needs: Patient Unable To Answer (09/15/2023)   PRAPARE - Transportation    Lack of Transportation (Medical): Patient unable to answer    Lack of Transportation (Non-Medical): Patient unable to answer  Physical Activity: Not on file  Stress: Not on file  Social Connections: Unknown (09/15/2023)   Social Connection and Isolation Panel [NHANES]  Frequency of Communication with Friends and Family: Patient unable to answer    Frequency of Social Gatherings with Friends and Family: Patient unable to answer    Attends Religious Services: Not on file    Active Member of Clubs or Organizations: Patient unable to answer    Attends Banker Meetings: Patient unable to answer    Marital Status: Patient unable to answer     Review of Systems: A 12 point ROS discussed and pertinent positives are indicated in the HPI above.  All other systems are negative.  Review of Systems  Constitutional:  Negative for chills, fatigue and fever.  Gastrointestinal:  Negative for diarrhea, nausea and vomiting.  Neurological:  Negative for dizziness and headaches.  Psychiatric/Behavioral:  Negative for agitation and behavioral problems.     Vital Signs: BP (!) 122/58   Pulse 69   Temp 98.9 F (37.2 C) (Axillary)   Resp (!) 24   Ht 5\' 6"  (1.676 m)   Wt 195 lb 12.3 oz (88.8 kg)   LMP  (LMP Unknown)   SpO2 96%   BMI 31.60 kg/m   Advance Care Plan: The advanced care  place/surrogate decision maker was discussed at the time of visit and the patient did not wish to discuss or was not able to name a surrogate decision maker or provide an advance care plan.  Physical Exam Constitutional:      Appearance: She is well-developed.  HENT:     Head: Atraumatic.     Mouth/Throat:     Mouth: Mucous membranes are moist.  Cardiovascular:     Rate and Rhythm: Normal rate and regular rhythm.     Heart sounds: No murmur heard. Pulmonary:     Comments: Tracheostomy tube in place  Abdominal:     General: Bowel sounds are normal.     Palpations: Abdomen is soft.  Musculoskeletal:        General: Normal range of motion.  Skin:    General: Skin is warm.  Neurological:     Mental Status: She is alert and oriented to person, place, and time.  Psychiatric:        Mood and Affect: Mood normal.        Behavior: Behavior normal.     Imaging: DG Chest Port 1 View Result Date: 09/22/2023 CLINICAL DATA:  Pneumonia. EXAM: PORTABLE CHEST 1 VIEW COMPARISON:  09/18/2023 FINDINGS: Stable asymmetric elevation right hemidiaphragm. The lungs are clear without focal pneumonia, edema, pneumothorax or pleural effusion. Interstitial markings are diffusely coarsened with chronic features. Tracheostomy tube again noted. A feeding tube passes into the stomach although the distal tip position is not included on the film. Telemetry leads overlie the chest. IMPRESSION: Chronic interstitial coarsening without acute cardiopulmonary findings. Electronically Signed   By: Donnal Fusi M.D.   On: 09/22/2023 08:44   CT ABDOMEN WO CONTRAST Result Date: 09/20/2023 CLINICAL DATA:  Respiratory failure and evaluation for possible percutaneous gastrostomy tube placement. EXAM: CT ABDOMEN WITHOUT CONTRAST TECHNIQUE: Multidetector CT imaging of the abdomen was performed following the standard protocol without IV contrast. RADIATION DOSE REDUCTION: This exam was performed according to the departmental  dose-optimization program which includes automated exposure control, adjustment of the mA and/or kV according to patient size and/or use of iterative reconstruction technique. COMPARISON:  03/29/2021 FINDINGS: Lower chest: Dense consolidation of the visualized posterior right lower lobe containing air bronchograms. Mild atelectasis/consolidation of the posterior basilar left lower lobe. Hepatobiliary: Multiple gallstones within the gallbladder. The liver  is unremarkable by unenhanced CT. Pancreas: Atrophic pancreas.  No inflammation. Spleen: Normal in size without focal abnormality. Adrenals/Urinary Tract: Adrenal glands are unremarkable. Kidneys are normal, without renal calculi, focal lesion, or hydronephrosis. Stomach/Bowel: No hiatal hernia. A feeding tube extends through the stomach and terminates in the duodenum bulb. Gastric anatomy is normal with no anatomic contraindication to attempted percutaneous gastrostomy. No evidence of bowel obstruction, ileus or free intraperitoneal air. No evidence of bowel malrotation. Vascular/Lymphatic: Mild atherosclerosis of the abdominal aorta without aneurysm. No lymphadenopathy identified. Other: No abdominal wall hernia, ascites or abnormal fluid collections. No body wall anasarca. Musculoskeletal: No acute or significant osseous findings. IMPRESSION: 1. Gastric anatomy is normal with no anatomic contraindication to attempted percutaneous gastrostomy. 2. Dense consolidation of the visualized posterior right lower lobe containing air bronchograms. Mild atelectasis/consolidation of the posterior basilar left lower lobe. 3. Cholelithiasis. 4. Atrophic pancreas. 5. Mild atherosclerosis of the abdominal aorta without aneurysm. Electronically Signed   By: Erica Hau M.D.   On: 09/20/2023 14:09   DG Abd Portable 1V Result Date: 09/18/2023 CLINICAL DATA:  Feeding tube placement. EXAM: PORTABLE ABDOMEN - 1 VIEW COMPARISON:  Same day. FINDINGS: Distal tip of feeding tube is  seen in expected position of distal stomach. IMPRESSION: Distal tip of feeding tube seen in expected position of distal stomach. Electronically Signed   By: Rosalene Colon M.D.   On: 09/18/2023 17:51   DG Abd Portable 1V Result Date: 09/18/2023 CLINICAL DATA:  Feeding tube placement. EXAM: PORTABLE ABDOMEN - 1 VIEW COMPARISON:  Radiograph dated 09/14/2023. FINDINGS: Feeding tube with tip in the epigastric area in the region of the body of the stomach. Air is noted in the colon. No bowel dilatation. No free air. No acute osseous pathology. Degenerative changes of spine. IMPRESSION: Feeding tube with tip in the body of the stomach. Electronically Signed   By: Angus Bark M.D.   On: 09/18/2023 14:24   DG Chest Port 1 View Result Date: 09/18/2023 CLINICAL DATA:  Status post tracheostomy. EXAM: PORTABLE CHEST 1 VIEW COMPARISON:  September 19, 2023. FINDINGS: Stable cardiomediastinal silhouette. Tracheostomy tube is in grossly good position. Minimal bibasilar subsegmental atelectasis is noted. Bony thorax is unremarkable. IMPRESSION: Tracheostomy tube in grossly good position. Electronically Signed   By: Rosalene Colon M.D.   On: 09/18/2023 12:49   DG CHEST PORT 1 VIEW Result Date: 09/18/2023 CLINICAL DATA:  Ineffective breathing related to ventilator. EXAM: PORTABLE CHEST 1 VIEW COMPARISON:  09/14/2023 FINDINGS: The endotracheal tube is present with tip measuring 3.4 cm above the carina. An enteric tube is present. Tip is off the field of view but below the left hemidiaphragm. Shallow inspiration with linear atelectasis in the lung bases. No pleural effusion or pneumothorax. Heart size is normal for technique. Degenerative changes in the spine. IMPRESSION: Appliances appear in satisfactory position. Shallow inspiration with atelectasis in the lung bases. Electronically Signed   By: Boyce Byes M.D.   On: 09/18/2023 01:57   MR BRAIN WO CONTRAST Result Date: 09/17/2023 CLINICAL DATA:  Altered mental  status EXAM: MRI HEAD WITHOUT CONTRAST TECHNIQUE: Multiplanar, multiecho pulse sequences of the brain and surrounding structures were obtained without intravenous contrast. COMPARISON:  04/02/2021 FINDINGS: Brain: No acute infarct, mass effect or extra-axial collection. No acute or chronic hemorrhage. There is multifocal hyperintense T2-weighted signal within the white matter. Generalized volume loss. The midline structures are normal. Vascular: Normal flow voids. Skull and upper cervical spine: Normal calvarium and skull base. Visualized upper  cervical spine and soft tissues are normal. Sinuses/Orbits:Diffuse paranasal sinus mucosal thickening. Bilateral mastoid effusions. Normal orbits. IMPRESSION: 1. No acute intracranial abnormality. 2. Findings of chronic small vessel ischemia and volume loss. Electronically Signed   By: Juanetta Nordmann M.D.   On: 09/17/2023 23:22   EEG adult Result Date: 09/16/2023 Arleene Lack, MD     09/16/2023 11:10 AM Patient Name: Lyana Distel MRN: [id number] Epilepsy Attending: Arleene Lack Referring Physician/Provider: Josiah Nigh, MD Date: 09/16/2023 Duration: 23.38 mins Patient history: 78yo F with ams. EEG to evaluate for seizure Level of alertness: Awake AEDs during EEG study: VPA Technical aspects: This EEG study was done with scalp electrodes positioned according to the 10-20 International system of electrode placement. Electrical activity was reviewed with band pass filter of 1-70Hz , sensitivity of 7 uV/mm, display speed of 37mm/sec with a 60Hz  notched filter applied as appropriate. EEG data were recorded continuously and digitally stored.  Video monitoring was available and reviewed as appropriate. Description: EEG showed continuous generalized polymorphic sharply contoured 3 to 6 Hz theta-delta slowing, at times with triphasic morphology. Hyperventilation and photic stimulation were not performed.   ABNORMALITY - Continuous slow, generalized IMPRESSION: This study is  suggestive of moderate diffuse encephalopathy. No seizures or epileptiform discharges were seen throughout the recording. Arleene Lack   DG Abd Portable 1V Result Date: 09/14/2023 CLINICAL DATA:  Endotracheal intubation.  OG tube placement. EXAM: PORTABLE ABDOMEN - 1 VIEW COMPARISON:  09/12/2023 FINDINGS: Limited field of view for tube placement verification purposes. An enteric tube is present with tip projecting over the midline mid abdominal region consistent with location in the distal stomach. Visualized bowel gas pattern is normal with scattered gas in the colon. Degenerative changes in the spine. IMPRESSION: Enteric tube tip projects over the lower mid abdomen consistent with location in the distal stomach. Electronically Signed   By: Boyce Byes M.D.   On: 09/14/2023 19:33   DG CHEST PORT 1 VIEW Result Date: 09/14/2023 CLINICAL DATA:  Endotracheal intubation.  OG tube placement EXAM: PORTABLE CHEST 1 VIEW COMPARISON:  09/13/2023 FINDINGS: Endotracheal tube present with tip measuring 4.3 cm above the carina. An enteric tube has been placed. Tip is off the field of view but below the left hemidiaphragm. Shallow inspiration. Mild cardiac enlargement. Small bilateral pleural effusions with likely basilar atelectasis. No pneumothorax. Calcification of the aorta. Mediastinal contours appear intact. Degenerative changes in the spine and shoulders. IMPRESSION: Cardiac enlargement with small pleural effusions and basilar atelectasis. Appliances appear in satisfactory location. Electronically Signed   By: Boyce Byes M.D.   On: 09/14/2023 19:32   DG Chest Port 1 View Result Date: 09/13/2023 CLINICAL DATA:  Acute respiratory failure EXAM: PORTABLE CHEST 1 VIEW COMPARISON:  09/13/2023 FINDINGS: Removal of the endotracheal tube. Elevation of the right diaphragm. Low lung volumes. Streaky airspace opacity at the left base. Borderline to mild cardiomegaly with central bronchovascular crowding.  IMPRESSION: Low lung volumes with streaky airspace opacity at the left base, atelectasis versus pneumonia versus aspiration, not clearly seen on the radiograph performed earlier today Electronically Signed   By: Esmeralda Hedge M.D.   On: 09/13/2023 22:16   ECHOCARDIOGRAM COMPLETE Result Date: 09/13/2023    ECHOCARDIOGRAM REPORT   Patient Name:   HENLIE SHILL Date of Exam: 09/13/2023 Medical Rec #:  [id number]      Height:       66.0 in Accession #:    [id number]     Weight:  201.3 lb Date of Birth:  April 28, 1946       BSA:          2.005 m Patient Age:    77 years       BP:           126/69 mmHg Patient Gender: F              HR:           79 bpm. Exam Location:  Inpatient Procedure: 2D Echo, Cardiac Doppler, Color Doppler and Intracardiac            Opacification Agent Indications:    Acute respiratory distress R06.03  History:        Patient has prior history of Echocardiogram examinations, most                 recent 04/03/2021. Arrythmias:Atrial Fibrillation; Risk                 Factors:Diabetes, Sleep Apnea and Non-Smoker.  Sonographer:    Adelia Homestead RVT RCS Referring Phys: [phone] [facility] M REESE  Sonographer Comments: Technically challenging study due to limited acoustic windows, Technically difficult study due to poor echo windows and patient is obese. Image acquisition challenging due to patient body habitus. IMPRESSIONS  1. Left ventricular ejection fraction, by estimation, is 70 to 75%. The left ventricle has hyperdynamic function. The left ventricle has no regional wall motion abnormalities. Left ventricular diastolic parameters are consistent with Grade I diastolic dysfunction (impaired relaxation).  2. Right ventricular systolic function is normal. The right ventricular size is normal.  3. The mitral valve is normal in structure. No evidence of mitral valve regurgitation. No evidence of mitral stenosis.  4. The aortic valve is tricuspid. There is mild calcification of the aortic valve. Aortic  valve regurgitation is not visualized. Mild aortic valve stenosis. Aortic valve mean gradient measures 6.6 mmHg. Aortic valve Vmax measures 1.77 m/s.  5. The inferior vena cava is normal in size with greater than 50% respiratory variability, suggesting right atrial pressure of 3 mmHg. FINDINGS  Left Ventricle: Left ventricular ejection fraction, by estimation, is 70 to 75%. The left ventricle has hyperdynamic function. The left ventricle has no regional wall motion abnormalities. Definity  contrast agent was given IV to delineate the left ventricular endocardial borders. The left ventricular internal cavity size was normal in size. There is no left ventricular hypertrophy. Left ventricular diastolic parameters are consistent with Grade I diastolic dysfunction (impaired relaxation). Right Ventricle: The right ventricular size is normal. No increase in right ventricular wall thickness. Right ventricular systolic function is normal. Left Atrium: Left atrial size was normal in size. Right Atrium: Right atrial size was normal in size. Pericardium: There is no evidence of pericardial effusion. Mitral Valve: The mitral valve is normal in structure. No evidence of mitral valve regurgitation. No evidence of mitral valve stenosis. Tricuspid Valve: The tricuspid valve is normal in structure. Tricuspid valve regurgitation is trivial. No evidence of tricuspid stenosis. Aortic Valve: The aortic valve is tricuspid. There is mild calcification of the aortic valve. Aortic valve regurgitation is not visualized. Mild aortic stenosis is present. Aortic valve mean gradient measures 6.6 mmHg. Aortic valve peak gradient measures  12.5 mmHg. Aortic valve area, by VTI measures 2.16 cm. Pulmonic Valve: The pulmonic valve was normal in structure. Pulmonic valve regurgitation is not visualized. No evidence of pulmonic stenosis. Aorta: The aortic root is normal in size and structure. Venous: The inferior vena cava is normal  in size with greater  than 50% respiratory variability, suggesting right atrial pressure of 3 mmHg. IAS/Shunts: No atrial level shunt detected by color flow Doppler.  LEFT VENTRICLE PLAX 2D LVIDd:         5.00 cm   Diastology LVIDs:         2.70 cm   LV e' medial:    6.64 cm/s LV PW:         1.00 cm   LV E/e' medial:  12.0 LV IVS:        1.00 cm   LV e' lateral:   10.10 cm/s LVOT diam:     2.00 cm   LV E/e' lateral: 7.9 LV SV:         74 LV SV Index:   37 LVOT Area:     3.14 cm  IVC IVC diam: 2.00 cm LEFT ATRIUM             Index        RIGHT ATRIUM          Index LA Vol (A2C):   51.1 ml 25.48 ml/m  RA Area:     9.56 cm LA Vol (A4C):   38.8 ml 19.35 ml/m  RA Volume:   16.90 ml 8.43 ml/m LA Biplane Vol: 46.7 ml 23.29 ml/m  AORTIC VALVE                     PULMONIC VALVE AV Area (Vmax):    2.30 cm      PV Vmax:       1.12 m/s AV Area (Vmean):   2.22 cm      PV Peak grad:  5.0 mmHg AV Area (VTI):     2.16 cm AV Vmax:           177.00 cm/s AV Vmean:          117.600 cm/s AV VTI:            0.344 m AV Peak Grad:      12.5 mmHg AV Mean Grad:      6.6 mmHg LVOT Vmax:         129.33 cm/s LVOT Vmean:        83.067 cm/s LVOT VTI:          0.236 m LVOT/AV VTI ratio: 0.69  AORTA Ao Root diam: 2.70 cm Ao Asc diam:  3.50 cm MITRAL VALVE MV Area (PHT): 3.27 cm     SHUNTS MV Decel Time: 232 msec     Systemic VTI:  0.24 m MV E velocity: 79.80 cm/s   Systemic Diam: 2.00 cm MV A velocity: 118.00 cm/s MV E/A ratio:  0.68 Jules Oar MD Electronically signed by Jules Oar MD Signature Date/Time: 09/13/2023/4:07:04 PM    Final    DG Chest Port 1 View Result Date: 09/13/2023 CLINICAL DATA:  78 year old female status post intubation. EXAM: PORTABLE CHEST 1 VIEW COMPARISON:  Chest x-ray 09/12/2023. FINDINGS: An endotracheal tube is in place with tip 5.3 cm above the carina. A nasogastric tube is seen extending into the stomach, however, the tip of the nasogastric tube extends below the lower margin of the image. Elevation of the right  hemidiaphragm. Opacity at the right base, which may reflect atelectasis and/or consolidation, likely with superimposed small right pleural effusion. Left lung is clear. No left pleural effusion. No pneumothorax. No evidence of pulmonary edema. Heart size is normal. The patient is rotated to the right on today's  exam, resulting in distortion of the mediastinal contours and reduced diagnostic sensitivity and specificity for mediastinal pathology. Atherosclerotic calcifications in the thoracic aorta. IMPRESSION: 1. Support apparatus, as above. 2. Atelectasis and/or consolidation in the right lower lobe with small right pleural effusion. 3. Aortic atherosclerosis. Electronically Signed   By: Alexandria Angel M.D.   On: 09/13/2023 05:59   CT HEAD WO CONTRAST ( ) Result Date: 09/12/2023 CLINICAL DATA:  Mental status change, unknown cause EXAM: CT HEAD WITHOUT CONTRAST TECHNIQUE: Contiguous axial images were obtained from the base of the skull through the vertex without intravenous contrast. RADIATION DOSE REDUCTION: This exam was performed according to the departmental dose-optimization program which includes automated exposure control, adjustment of the mA and/or kV according to patient size and/or use of iterative reconstruction technique. COMPARISON:  CT head 04/09/2023 FINDINGS: Brain: Cerebral ventricle sizes are concordant with the degree of cerebral volume loss. Patchy and confluent areas of decreased attenuation are noted throughout the deep and periventricular white matter of the cerebral hemispheres bilaterally, compatible with chronic microvascular ischemic disease. No evidence of large-territorial acute infarction. No parenchymal hemorrhage. No mass lesion. No extra-axial collection. No mass effect or midline shift. No hydrocephalus. Basilar cisterns are patent. Vascular: No hyperdense vessel. Skull: No acute fracture or focal lesion. Sinuses/Orbits: Paranasal sinuses and mastoid air cells are clear. The  orbits are unremarkable. Other: Enteric and endotracheal tubes partially visualized. IMPRESSION: No acute intracranial abnormality. Electronically Signed   By: Morgane  Naveau M.D.   On: 09/12/2023 17:10   DG Abd Portable 1V Result Date: 09/12/2023 CLINICAL DATA:  [id number] Encounter for feeding tube placement [id number] EXAM: PORTABLE ABDOMEN - 1 VIEW COMPARISON:  None Available. FINDINGS: Enteric tube courses below the hemidiaphragm with tip and side port overlying the expected region of the gastric lumen. The bowel gas pattern is normal. No radio-opaque calculi or other significant radiographic abnormality are seen. IMPRESSION: 1. Enteric tube in good position. 2. Nonobstructive bowel gas pattern. Electronically Signed   By: Morgane  Naveau M.D.   On: 09/12/2023 17:07   DG Chest Portable 1 View Result Date: 09/12/2023 CLINICAL DATA:  post intubation EXAM: PORTABLE CHEST 1 VIEW COMPARISON:  Chest x-ray 07/18/2023, CT chest 04/09/2023 FINDINGS: Endotracheal tube with tip terminating 4 cm above the carina. Enteric tube coursing caudally with tip and side port collimated off view. The heart and mediastinal contours are unchanged Low lung volumes with no focal consolidation. No pulmonary edema. Left costophrenic angle collimated off view. No pleural effusion. No pneumothorax. No acute osseous abnormality. IMPRESSION: 1. Endotracheal tube in good position. 2. Enteric tube coursing caudally with tip and side port collimated off view. 3. Low lung volumes with no active disease. 4. Left costophrenic angle collimated off view. Electronically Signed   By: Morgane  Naveau M.D.   On: 09/12/2023 17:07    Labs:  CBC: Recent Labs    09/18/23 0330 09/19/23 0346 09/20/23 0412 09/22/23 0619  WBC 8.8 10.7* 12.6* 10.5  HGB 10.0* 10.3* 10.4* 10.0*  HCT 32.0* 33.0* 33.6* 32.0*  PLT 188 220 234 276    COAGS: Recent Labs    04/09/23 1336 09/13/23 0303 09/13/23 1253 09/14/23 0401  INR 1.5*  --   --   --   APTT  --   61* 86* 77*    BMP: Recent Labs    09/18/23 0330 09/19/23 0346 09/20/23 0412 09/22/23 0619  NA 143 142 138 140  K 3.6 4.3 4.2 4.1  CL 102 100 95* 101  CO2  30 31 32 30  GLUCOSE 217* 265* 253* 207*  BUN 33* 27* 30* 28*  CALCIUM  8.4* 8.6* 8.4* 8.5*  CREATININE 0.53 0.55 0.58 0.52  GFRNONAA >60 >60 >60 >60    LIVER FUNCTION TESTS: Recent Labs    04/09/23 1336 07/18/23 1637 09/12/23 1601 09/22/23 0619  BILITOT 0.7 0.8 0.8 0.6  AST 10* 14* 10* 39  ALT 12 13 6  60*  ALKPHOS 31* 31* 33* 41  PROT 6.8 6.2* 6.4* 6.4*  ALBUMIN  3.1* 2.8* 2.9* 1.6*    TUMOR MARKERS: No results for input(s): "AFPTM", "CEA", "CA199", "CHROMGRNA" in the last 8760 hours.  Assessment and Plan: Risks and benefits image guided gastrostomy tube placement was discussed with the patient and family including, but not limited to the need for a barium enema during the procedure, bleeding, infection, peritonitis and/or damage to adjacent structures.  All of the patient and family's questions were answered, patient is agreeable to proceed.  Consent obtained over the phone with brother and POA Kailly Bickham 09/23/23.    Consent signed and in IR black box.  Pt is currently ventilated from previous respiratory arrest and multiorgan failure - scheduled for image guided gastrostomy tube placement 09/24/23.    Thank you for allowing our service to participate in Marizol Ramirez 's care.  Electronically Signed: Pasty Bongo, PA-C   09/23/2023, 8:29 AM    I spent a total of 40 Minutes  in face to face in clinical consultation, greater than 50% of which was counseling/coordinating care for image guided gastrostomy tube placement.

## 2023-09-23 NOTE — Progress Notes (Signed)
 Physical Therapy Treatment Patient Details Name: Becky Hendricks MRN: [id number] DOB: 12-Oct-1945 Today's Date: 09/23/2023   History of Present Illness 78 y.o female presented 1/30  after being found unresponsive in her wheelchair at her facility with SpO2 75%. + RSV. 2/1 reintubated after aspiration event from dinner. Trach placed 2/5. 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    PT Comments  Pt tolerates treatment well, sitting at the edge of bed with PT assistance for increased time and without apparent respiratory distress. Pt continues to demonstrate profound weakness, with limited strength against gravity. Pt will benefit from continued mobility in an effort to improve pulmonary function and to reduce caregiver burden.    If plan is discharge home, recommend the following: Two people to help with walking and/or transfers;Two people to help with bathing/dressing/bathroom;Assist for transportation;Assistance with cooking/housework;Supervision due to cognitive status   Can travel by private vehicle     No  Equipment Recommendations  None recommended by PT    Recommendations for Other Services       Precautions / Restrictions Precautions Precautions: Fall;Other (comment) Precaution Comments: tracheostomy Restrictions Weight Bearing Restrictions Per Provider Order: No     Mobility  Bed Mobility Overal bed mobility: Needs Assistance Bed Mobility: Supine to Sit, Sit to Supine     Supine to sit: Total assist, HOB elevated Sit to supine: Total assist, HOB elevated        Transfers                        Ambulation/Gait                   Stairs             Wheelchair Mobility     Tilt Bed    Modified Rankin (Stroke Patients Only)        Balance Overall balance assessment: Needs assistance Sitting-balance support: No upper extremity supported, Feet unsupported Sitting balance-Leahy Scale: Poor Sitting balance - Comments: modA Postural control: Posterior lean, Right lateral lean                                  Cognition Arousal: Alert Behavior During Therapy: Flat affect Overall Cognitive Status: Difficult to assess                                 General Comments: pt nods yes/no intermittently which appear accurate        Exercises Other Exercises Other Exercises: passive ankle stretch bilaterally for 30 seconds Other Exercises: passive knee flexion/extension x 8 reps bilaterally Other Exercises: passive hip flexion, abduction/adduction x 8 reps bilaterally Other Exercises: elbow flexion/extension PROM x 8 reps bilaterally and L shoulder flexion x 8 reps (R shoulder circumduction through small range performed due to ventilator tubing limiting ROM)    General Comments General comments (skin integrity, edema, etc.): VSS on PRVC 5 PEEP 30% FiO2      Pertinent Vitals/Pain Pain Assessment Pain Assessment: CPOT Facial Expression: Relaxed, neutral Body Movements: Absence of movements Muscle Tension: Tense, rigid Compliance with ventilator (intubated pts.): Tolerating ventilator or movement Vocalization (extubated pts.): N/A CPOT Total: 1    Home Living  Prior Function            PT Goals (current goals can now be found in the care plan section) Acute Rehab PT Goals Patient Stated Goal: Continue therapy at [facility] Progress towards PT goals: Progressing toward goals (very slow progress)    Frequency    Min 1X/week      PT Plan      Co-evaluation              AM-PAC PT "6 Clicks" Mobility   Outcome Measure  Help needed turning from your back to your side while in a flat bed without using bedrails?: Total Help needed  moving from lying on your back to sitting on the side of a flat bed without using bedrails?: Total Help needed moving to and from a bed to a chair (including a wheelchair)?: Total Help needed standing up from a chair using your arms (e.g., wheelchair or bedside chair)?: Total Help needed to walk in hospital room?: Total Help needed climbing 3-5 steps with a railing? : Total 6 Click Score: 6    End of Session Equipment Utilized During Treatment: Oxygen  Activity Tolerance: Patient tolerated treatment well Patient left: in bed;with call bell/phone within reach Nurse Communication: Mobility status;Need for lift equipment PT Visit Diagnosis: Muscle weakness (generalized) (M62.81);Difficulty in walking, not elsewhere classified (R26.2);Other symptoms and signs involving the nervous system (R29.898)     Time: 0829-0900 PT Time Calculation (min) (ACUTE ONLY): 31 min  Charges:    $Therapeutic Exercise: 8-22 mins $Therapeutic Activity: 8-22 mins PT General Charges $$ ACUTE PT VISIT: 1 Visit                     Rexie Catena, PT, DPT Acute Rehabilitation Office [phone]    Rexie Catena 09/23/2023, 9:43 AM

## 2023-09-23 NOTE — Plan of Care (Signed)
  Problem: Education: Goal: Knowledge of General Education information will improve Description: Including pain rating scale, medication(s)/side effects and non-pharmacologic comfort measures Outcome: Progressing   Problem: Clinical Measurements: Goal: Ability to maintain clinical measurements within normal limits will improve Outcome: Progressing Goal: Will remain free from infection Outcome: Progressing Goal: Respiratory complications will improve Outcome: Progressing   Problem: Nutrition: Goal: Adequate nutrition will be maintained Outcome: Progressing   Problem: Metabolic: Goal: Ability to maintain appropriate glucose levels will improve Outcome: Not Progressing

## 2023-09-23 NOTE — Progress Notes (Signed)
 eLink Physician-Brief Progress Note Patient Name: Kialey Prettyman DOB: Jan 22, 1946 MRN: [id number]   Date of Service  09/23/2023  HPI/Events of Note  Fever 102.6 RSV infection on admission. Abx broadened for HCAp on 2/8  eICU Interventions  Tylenol  ordered Continue Cefepime  D3     Intervention Category Intermediate Interventions: Infection - evaluation and management  Piedad Standiford Genetta Kenning 09/23/2023, 9:24 PM

## 2023-09-23 NOTE — Progress Notes (Signed)
 SLP Cancellation Note  Patient Details Name: Becky Hendricks MRN: [id number] DOB: June 28, 1946   Cancelled treatment:        Attempted to see pt for PMSV and swallowing.  Pt remains on vent and has been having difficulty weaning. SLP will sign off at this time.  Please reconsult when pt is on TCT, or if pt is a candidate for inline PMV/swallow.   Elester Grim, MA, CCC-SLP Acute Rehabilitation Services Office: [phone] 09/23/2023, 9:10 AM

## 2023-09-23 NOTE — Progress Notes (Signed)
 NAME:  Becky Hendricks, MRN:  [id number], DOB:  10/04/1945, LOS: 11 ADMISSION DATE:  09/12/2023 CONSULTATION DATE:  09/12/2023 REFERRING MD:  Delana Favors - EDP, CHIEF COMPLAINT:  Unresponsiveness   History of Present Illness:  78 year old woman who presented to [facility] ED 1/30 after she was found unresponsive in her wheelchair at her facility with reported SpO2 75%. PMHx significant for HTN, HLD, PAF, prior cardiac arrest, AoC respiratory failure with hypoxia/hypercapnia (on 3L HOT), T2DM, osteomyelitis, C. Diff, bipolar disorder, functional quadriplegia. Recent presentation to [facility] ED 07/19/2023 for episode of unresponsiveness. Admitted with COVID 03/2023.  History is obtained primarily from chart review. Per EMS, patient was found unresponsive in her wheelchair at her facility with SpO2 75%. Placed on NRB and intubated on ED arrival. Labs were notable for WBC 11.3, Hgb 13.5, Plt 145. Na 141, K 4.8, CO2 32, Cr 0.63 (baseline), LFTs WNL. VBG 7.344/pCO2 65.9/bicarb 35.9. LA 0.8. VPA level WNL. Post-intubation ABG 7.384/pCO2 53.6/pO2 293/bicarb 32. CXR with low lung volumes, no active disease. AXR nonobstructive bowel gas pattern. CT Head NAICA.  PCCM consulted for ICU admission.  Pertinent Medical History:   Past Medical History:  Diagnosis Date   Acute on chronic respiratory failure with hypoxia and hypercapnia (HCC)    Acute respiratory failure (HCC) 03/29/2021   Atrial fibrillation (HCC)    Bipolar disorder (HCC) 04/27/2020   Cardiac arrest (HCC)    Clostridium difficile diarrhea 08/02/2019   Colitis 03/29/2021   Dehydration    Drug rash    Encephalopathy acute 11/18/2020   Essential hypertension    Fall 02/11/2020   Hypoxia    Morbid obesity (HCC)    Nausea and vomiting 11/17/2020   Osteomyelitis (HCC) 12/03/2019   Pneumonia due to COVID-19 virus 09/15/2019   Pneumonia of lower lobe due to infectious organism 03/29/2021   Severe sepsis (HCC) 12/03/2019   SIRS (systemic inflammatory response  syndrome) (HCC) 11/17/2020   Type 2 diabetes mellitus (HCC)    Urinary tract infection without hematuria 09/15/2019   Weakness    Significant Hospital Events: Including procedures, antibiotic start and stop dates in addition to other pertinent events   1/30 - Presented from facility after being found unresponsive in wheelchair hypoxic to 75%. Intubated in ED. 2/1 reintubated after aspiration event from dinner 2/5 s/p trach 2/7 Tolerated PS for a few hours yesterday then had heavy secretions, sweating requiring PRVC again. 1 x fever yesterday am before starting abx (ceftriaxone ) 2/8 awake. Resp culture also growing PSA so abx changed to cefepime . Looks comfortable on PSV but got agitated at HS req'd freq PRNs 2/9 added HS melatonin. Cont to work on weaning. Further adjustment of glycemic control   Interim History / Subjective:   On pressure support weans.  No acute issues  Objective:  Blood pressure (!) 122/58, pulse 69, temperature 98.9 F (37.2 C), temperature source Axillary, resp. rate (!) 21, height 5\' 6"  (1.676 m), weight 88.8 kg, SpO2 95%.    Vent Mode: PRVC FiO2 (%):  [30 %] 30 % Set Rate:  [16 bmp] 16 bmp Vt Set:  [400 mL-410 mL] 400 mL PEEP:  [5 cmH20] 5 cmH20 Pressure Support:  [12 cmH20] 12 cmH20 Plateau Pressure:  [16 cmH20-18 cmH20] 18 cmH20   Intake/Output Summary (Last 24 hours) at 09/23/2023 0943 Last data filed at 09/23/2023 0800 Gross per 24 hour  Intake 2000 ml  Output 700 ml  Net 1300 ml   Filed Weights   09/21/23 0710 09/22/23 0435 09/23/23 0500  Weight:  88.1 kg 89 kg 88.8 kg   Physical Examination: Gen:      No acute distress, chronically ill appearing HEENT:  EOMI, sclera anicteric, trach Neck:     No masses; no thyromegaly Lungs:    Clear to auscultation bilaterally; normal respiratory effort CV:         Regular rate and rhythm; no murmurs Abd:      + bowel sounds; soft, non-tender; no palpable masses, no distension Ext:    right hand contracted,  tremors Skin:      Warm and dry; no rash Neuro: Awake, follows commands,   Lab/imaging reviewed Significant for BUN/creatinine 28/0.52 WBC 10.5, hemoglobin 10.0, platelets 276 No new imaging  General laying in bed no distress HENT NCAT 6 trach is midline still w/ intermittent thick secretions PERRL Pulm coarse bilateral breath sounds currently on full support. No accessory use. PCXR trach good position, R sided elevated HD. R>L airspace disease  Card rrr Abd soft Ext diffuse ecchymosis dependent edema pulses are strong Neuro intention tremor. Follows commands tries to communicate. Right hand contracted. Moves all ext but left side weaker, although she is generally weak and has bilateral foot drop   Assessment & Plan:  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: initially Due to untreated OHS, recurrent aspiration; RSV infection and polypharmacy in setting of overall background is FTT recurrent admissions and ambulatory failure Now trach dep (2/5) -extubated 1/31 and reintubated on 2/1; Trached 2/5 Plan Continue PSV as tolerated, and assess for ATC daily. Think we should be able to liberate her from vent VAP bundle Minimize sedation  Proteus and Pseudomonas HCAP Abx widened to cover for PSA on 2/8 Plan Day 3 of 7 cefepime    ICU delirium/encephalopathy superimposed on bipolar disorder  -on-going and intermittent, today appears appropriate Plan Continue Risperdal  via tube at at bedtime Continue valproic  acid twice daily Continue Provigil  daily Delirium precautions  Added melatonin at HS as well. If continues to have night time delirium might need to consider change in abx (in case cefepime  contributing to this)  H/o,Paroxysmal Afib, HFpEF, HTN and HLD Plan Continue telemetry monitoring Continuing therapeutic Lovenox , hold off on resuming DOAC until after PEG Continue Lipitor via tube, Coreg  via tube Resumed spironolactone  on 2/8 will continue    T2DM Plan Continue sliding scale insulinGoal glucose 140-180 Tube feed coverage at 5 units NovoLog  every 4 hours continue Semglee  15 bid  (hold off on inc'd basal as will eventually be NPO for PEG)  Functional Quad Plan Supportive care  Needs LTAC vs SNF  Dysphagia Plan Continue tube feeds IR consult for PEG   Best Practice (right click and "Reselect all SmartList Selections" daily)   Diet/type: tubefeeds DVT prophylaxis: SCDs Start: 09/12/23 1731 and therapeutic Lovenox    Pressure ulcers present: N/A GI prophylaxis: H2B Lines: N/A Foley:  N/A Code Status:  full code Last date of multidisciplinary goals of care discussion [pending]  The patient is critically ill with multiple organ system failure and requires high complexity decision making for assessment and support, frequent evaluation and titration of therapies, advanced monitoring, review of radiographic studies and interpretation of complex data.   Critical Care Time devoted to patient care services, exclusive of separately billable procedures, described in this note is 35 minutes.   Ronnette Rump MD [facility] See Amion for pager  If no response to pager , please call [phone] until 7pm After 7:00 pm call Elink  [phone] 09/23/2023, 9:51 AM

## 2023-09-23 NOTE — TOC Progression Note (Signed)
 Transition of Care [facility]) - Progression Note    Patient Details  Name: Becky Hendricks MRN: [id number] Date of Birth: Dec 15, 1945  [facility]) CM/SW Contact  Tom-Johnson, Jessicah Croll Daphne, RN Phone Number: 09/23/2023, 3:24 PM  Clinical Narrative:     Patient continues on Trach/Vent. On IV abx. Plan for Peg placement tomorrow 09/24/23 by IR.  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   CM will continue to follow as patient progresses with care towards discharge.          Expected Discharge Plan and Services                                               Social Determinants of Health (SDOH) Interventions SDOH Screenings   Food Insecurity: Patient Unable To Answer (09/15/2023)  Housing: Patient Unable To Answer (09/15/2023)  Transportation Needs: Patient Unable To Answer (09/15/2023)  Utilities: Patient Unable To Answer (09/15/2023)  Social Connections: Unknown (09/15/2023)  Tobacco Use: Low Risk  (09/23/2023)    Readmission Risk Interventions    04/11/2023    6:43 PM 04/11/2023    2:05 PM  Readmission Risk Prevention Plan  Transportation Screening Complete Complete  PCP or Specialist Appt within 5-7 Days Complete   Not Complete comments from [facility]   Home Care Screening Complete Complete  Medication Review (RN CM) Complete Complete

## 2023-09-24 ENCOUNTER — Inpatient Hospital Stay (HOSPITAL_COMMUNITY): Payer: Medicare Other

## 2023-09-24 ENCOUNTER — Other Ambulatory Visit (HOSPITAL_COMMUNITY): Payer: Medicare Other

## 2023-09-24 ENCOUNTER — Institutional Professional Consult (permissible substitution)
Admission: EM | Admit: 2023-09-24 | Discharge: 2023-11-01 | Disposition: A | Discharge status: Skilled Nursing Facility | Payer: Medicare Other | Source: Other Acute Inpatient Hospital | Attending: Internal Medicine | Admitting: Internal Medicine

## 2023-09-24 DIAGNOSIS — E119 Type 2 diabetes mellitus without complications: Secondary | ICD-10-CM | POA: Diagnosis not present

## 2023-09-24 DIAGNOSIS — J9601 Acute respiratory failure with hypoxia: Secondary | ICD-10-CM | POA: Diagnosis not present

## 2023-09-24 HISTORY — PX: IR GASTROSTOMY TUBE MOD SED: IMG625

## 2023-09-24 LAB — BASIC METABOLIC PANEL
Anion gap: 13 (ref 5–15)
BUN: 31 mg/dL — ABNORMAL HIGH (ref 8–23)
CO2: 27 mmol/L (ref 22–32)
Calcium: 8.8 mg/dL — ABNORMAL LOW (ref 8.9–10.3)
Chloride: 94 mmol/L — ABNORMAL LOW (ref 98–111)
Creatinine, Ser: 0.46 mg/dL (ref 0.44–1.00)
GFR, Estimated: >60 mL/min (ref >60)
Glucose, Bld: 111 mg/dL — ABNORMAL HIGH (ref 70–99)
Potassium: 4.8 mmol/L (ref 3.5–5.1)
Sodium: 134 mmol/L — ABNORMAL LOW (ref 135–145)

## 2023-09-24 LAB — CBC
HCT: 31.1 % — ABNORMAL LOW (ref 36.0–46.0)
Hemoglobin: 9.5 g/dL — ABNORMAL LOW (ref 12.0–15.0)
MCH: 29.9 pg (ref 26.0–34.0)
MCHC: 30.5 g/dL (ref 30.0–36.0)
MCV: 97.8 fL (ref 80.0–100.0)
Platelets: 278 10*3/uL (ref 150–400)
RBC: 3.18 MIL/uL — ABNORMAL LOW (ref 3.87–5.11)
RDW: 14.3 % (ref 11.5–15.5)
WBC: 15.3 10*3/uL — ABNORMAL HIGH (ref 4.0–10.5)
nRBC: 0.2 % (ref 0.0–0.2)

## 2023-09-24 LAB — GLUCOSE, CAPILLARY
Glucose-Capillary: 101 mg/dL — ABNORMAL HIGH (ref 70–99)
Glucose-Capillary: 96 mg/dL (ref 70–99)
Glucose-Capillary: 108 mg/dL — ABNORMAL HIGH (ref 70–99)
Glucose-Capillary: 138 mg/dL — ABNORMAL HIGH (ref 70–99)

## 2023-09-24 LAB — PROTIME-INR
INR: 1.3 — ABNORMAL HIGH (ref 0.8–1.2)
Prothrombin Time: 16.4 s — ABNORMAL HIGH (ref 11.4–15.2)

## 2023-09-24 MED ORDER — CEFAZOLIN SODIUM-DEXTROSE 2-4 GM/100ML-% IV SOLN
INTRAVENOUS | Status: AC | PRN
  Administered 2023-09-24: 2 g via INTRAVENOUS

## 2023-09-24 MED ORDER — ENOXAPARIN SODIUM 80 MG/0.8ML IJ SOSY: 80.0000 mg | PREFILLED_SYRINGE | Freq: Two times a day (BID) | INTRAMUSCULAR | Status: DC

## 2023-09-24 MED ORDER — IOHEXOL 300 MG/ML  SOLN: 50.0000 mL | Freq: Once | INTRAMUSCULAR | Status: DC | PRN

## 2023-09-24 MED ORDER — ORAL CARE MOUTH RINSE: 15.0000 mL | OROMUCOSAL | Status: DC | PRN

## 2023-09-24 MED ORDER — MICONAZOLE NITRATE POWD: 1.0000 | Freq: Two times a day (BID) | Status: DC | PRN

## 2023-09-24 MED ORDER — JUVEN PO PACK: 1.0000 | PACK | Freq: Two times a day (BID) | ORAL | Status: DC

## 2023-09-24 MED ORDER — ORAL CARE MOUTH RINSE: 15.0000 mL | OROMUCOSAL | Status: DC

## 2023-09-24 MED ORDER — INSULIN GLARGINE-YFGN 100 UNIT/ML ~~LOC~~ SOLN: 15.0000 [IU] | Freq: Two times a day (BID) | SUBCUTANEOUS | Status: DC

## 2023-09-24 MED ORDER — CARVEDILOL 3.125 MG PO TABS: 3.1250 mg | ORAL_TABLET | Freq: Two times a day (BID) | ORAL | Status: DC

## 2023-09-24 MED ORDER — OSMOLITE 1.5 CAL PO LIQD: 1000.0000 mL | ORAL | Status: DC

## 2023-09-24 MED ORDER — LIDOCAINE HCL 1 % IJ SOLN
INTRAMUSCULAR | Status: AC
  Filled 2023-09-24: qty 20

## 2023-09-24 MED ORDER — VALPROIC ACID 250 MG/5ML PO SOLN: 500.0000 mg | Freq: Two times a day (BID) | ORAL | Status: DC

## 2023-09-24 MED ORDER — SODIUM CHLORIDE 0.9 % IV SOLN: 2.0000 g | Freq: Three times a day (TID) | INTRAVENOUS | Status: DC

## 2023-09-24 MED ORDER — IOHEXOL 300 MG/ML  SOLN
50.0000 mL | Freq: Once | INTRAMUSCULAR | Status: AC | PRN
  Administered 2023-09-24: 15 mL

## 2023-09-24 MED ORDER — FAMOTIDINE 20 MG PO TABS: 20.0000 mg | ORAL_TABLET | Freq: Two times a day (BID) | ORAL | Status: DC

## 2023-09-24 MED ORDER — IPRATROPIUM-ALBUTEROL 0.5-2.5 (3) MG/3ML IN SOLN: 3.0000 mL | Freq: Four times a day (QID) | RESPIRATORY_TRACT | Status: DC | PRN

## 2023-09-24 MED ORDER — FENTANYL CITRATE (PF) 100 MCG/2ML IJ SOLN
INTRAMUSCULAR | Status: AC
  Filled 2023-09-24: qty 2

## 2023-09-24 MED ORDER — CEFAZOLIN SODIUM-DEXTROSE 2-4 GM/100ML-% IV SOLN
INTRAVENOUS | Status: AC
  Filled 2023-09-24: qty 100

## 2023-09-24 MED ORDER — INSULIN ASPART 100 UNIT/ML IJ SOLN: 0.0000 [IU] | INTRAMUSCULAR | Status: DC

## 2023-09-24 MED ORDER — ATORVASTATIN CALCIUM 10 MG PO TABS: 10.0000 mg | ORAL_TABLET | Freq: Every day | ORAL | Status: DC

## 2023-09-24 MED ORDER — DIATRIZOATE MEGLUMINE & SODIUM 66-10 % PO SOLN
30.0000 mL | Freq: Once | ORAL | Status: AC
Start: 2023-09-24 — End: 2023-09-24
  Administered 2023-09-24: 30 mL

## 2023-09-24 MED ORDER — GLUCAGON HCL RDNA (DIAGNOSTIC) 1 MG IJ SOLR
INTRAMUSCULAR | Status: AC
  Filled 2023-09-24: qty 1

## 2023-09-24 MED ORDER — RISPERIDONE 3 MG PO TABS: 3.0000 mg | ORAL_TABLET | Freq: Every day | ORAL | Status: DC

## 2023-09-24 MED ORDER — MIDAZOLAM HCL 2 MG/2ML IJ SOLN
INTRAMUSCULAR | Status: AC | PRN
  Administered 2023-09-24 (×2): .5 mg via INTRAVENOUS

## 2023-09-24 MED ORDER — PROSOURCE TF20 ENFIT COMPATIBL EN LIQD: 60.0000 mL | Freq: Every day | ENTERAL | Status: DC

## 2023-09-24 MED ORDER — MODAFINIL 200 MG PO TABS: 200.0000 mg | ORAL_TABLET | Freq: Every day | ORAL | Status: DC

## 2023-09-24 MED ORDER — MELATONIN 3 MG PO TABS: 3.0000 mg | ORAL_TABLET | Freq: Every day | ORAL | Status: DC

## 2023-09-24 MED ORDER — SPIRONOLACTONE 25 MG PO TABS: 25.0000 mg | ORAL_TABLET | Freq: Every day | ORAL | Status: DC

## 2023-09-24 MED ORDER — MIDAZOLAM HCL 2 MG/2ML IJ SOLN
INTRAMUSCULAR | Status: AC
  Filled 2023-09-24: qty 2

## 2023-09-24 MED ORDER — GLUCAGON HCL RDNA (DIAGNOSTIC) 1 MG IJ SOLR
INTRAMUSCULAR | Status: AC | PRN
  Administered 2023-09-24: .5 mg via INTRAVENOUS

## 2023-09-24 MED ORDER — FENTANYL CITRATE PF 50 MCG/ML IJ SOSY: 25.0000 ug | PREFILLED_SYRINGE | INTRAMUSCULAR | Status: DC | PRN

## 2023-09-24 MED ORDER — ACETAMINOPHEN 160 MG/5ML PO SOLN: 650.0000 mg | Freq: Four times a day (QID) | ORAL | Status: DC | PRN

## 2023-09-24 MED ORDER — INSULIN ASPART 100 UNIT/ML IJ SOLN: 8.0000 [IU] | INTRAMUSCULAR | Status: DC

## 2023-09-24 NOTE — TOC Transition Note (Signed)
Transition of Care [facility]) - Discharge Note   Patient Details  Name: Becky Hendricks MRN: [id number] Date of Birth: August 02, 1946  Transition of Care [facility]) CM/SW Contact:  Tom-Johnson, Hershal Coria, RN Phone Number: 09/24/2023, 1:27 PM   Clinical Narrative:     Patient's Insurance Auth approved for [employer]. Patient is scheduled for discharge today to [facility]. Accepting MD is Dr. Luna Kitchens. MD to MD report can be called to [phone]. RN to RN report can be called to the same number.   No further TOC needs noted.        Final next level of care: Long Term Acute Care (LTAC) Barriers to Discharge: Barriers Resolved   Patient Goals and CMS Choice Patient states their goals for this hospitalization and ongoing recovery are:: To go to [facility] and return home CMS Medicare.gov Compare Post Acute Care list provided to:: Patient Choice offered to / list presented to : Patient, Sibling (Brother, [name])      Discharge Placement                Patient to be transferred to facility by: In-hospital transfer via bed.      Discharge Plan and Services Additional resources added to the After Visit Summary for                  DME Arranged: N/A DME Agency: NA       HH Arranged: NA HH Agency: NA        Social Drivers of Health (SDOH) Interventions SDOH Screenings   Food Insecurity: Patient Unable To Answer (09/15/2023)  Housing: Patient Unable To Answer (09/15/2023)  Transportation Needs: Patient Unable To Answer (09/15/2023)  Utilities: Patient Unable To Answer (09/15/2023)  Social Connections: Unknown (09/15/2023)  Tobacco Use: Low Risk  (09/23/2023)     Readmission Risk Interventions    09/24/2023    1:26 PM 04/11/2023    6:43 PM 04/11/2023    2:05 PM  Readmission Risk Prevention Plan  Transportation Screening Complete Complete Complete  PCP or Specialist Appt within 5-7 Days  Complete   Not Complete comments  from [facility]   Home Care Screening   Complete Complete  Medication Review (RN CM)  Complete Complete  Medication Review (RN Care Manager) Referral to Pharmacy    PCP or Specialist appointment within 3-5 days of discharge Complete    HRI or Home Care Consult Complete    SW Recovery Care/Counseling Consult Complete    Palliative Care Screening Not Applicable    Skilled Nursing Facility Not Applicable

## 2023-09-24 NOTE — Progress Notes (Signed)
Pt transported to IR and back to 3M09 without any complications.

## 2023-09-24 NOTE — Discharge Summary (Signed)
Physician Discharge Summary  Patient ID: Becky Hendricks MRN: [id number] DOB/AGE: [birth year] 76 y.o.  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: initially Due to untreated OHS, recurrent aspiration; RSV infection and polypharmacy in setting of overall background is FTT recurrent admissions and ambulatory failure Now trach dep (2/5) -extubated 1/31 and reintubated on 2/1; Trached 2/5 Plan Continue PSV as tolerated, and assess for ATC daily VAP bundle Holding wean 2/11 with planned PEG placement  Minimize sedation   Proteus and Pseudomonas HCAP RSV Abx widened to cover for PSA on 2/8 Plan Day 4 of 7 cefepime    ICU delirium/encephalopathy superimposed on bipolar disorder  -on-going and intermittent, labile  -delirium seems slightly improved but more drowsy  Plan Continue Risperdal via tube at at bedtime Continue valproic acid twice daily Continue Provigil daily Delirium precautions  melatonin at HS    H/o,Paroxysmal Afib, HFpEF, HTN and HLD Plan Continue telemetry monitoring Continuing therapeutic Lovenox, hold off on resuming DOAC until after PEG Continue Lipitor via tube, Coreg via tube Continue spironolactone    T2DM Plan Continue sliding scale insulinGoal glucose 140-180 Tube feed coverage at 5 units NovoLog every 4 hours continue Semglee 15 bid  May need to increase basal insulin - holding for now with TF on hold for PEG    Functional Quad Plan Supportive care  Needs LTAC vs SNF   Dysphagia Plan PEG placement by IR prior to select dc  Brief Summary: 78 year old woman who presented to [facility] ED 1/30 after she was found unresponsive in her wheelchair at her facility with reported SpO2 75%. PMHx significant for HTN, HLD,  PAF, prior cardiac arrest, AoC respiratory failure with hypoxia/hypercapnia (on 3L HOT), T2DM, osteomyelitis, C. Diff, bipolar disorder, functional quadriplegia. Recent presentation to [facility] ED 07/19/2023 for episode of unresponsiveness. Admitted with COVID 03/2023.   History is obtained primarily from chart review. Per EMS, patient was found unresponsive in her wheelchair at her facility with SpO2 75%. Placed on NRB and intubated on ED arrival. Labs were notable for WBC 11.3, Hgb 13.5, Plt 145. Na 141, K 4.8, CO2 32, Cr 0.63 (baseline), LFTs WNL. VBG 7.344/pCO2 65.9/bicarb 35.9. LA 0.8. VPA level WNL. Post-intubation ABG 7.384/pCO2 53.6/pO2 293/bicarb 32. CXR with low lung volumes, no active disease. AXR nonobstructive bowel gas pattern. CT Head NAICA.   Hospital events:  1/30 - Presented from facility after being found unresponsive in wheelchair hypoxic to 75%. Intubated in ED. 1/30 RVP>> RSV 2/1 reintubated after aspiration event from dinner 2/5 s/p trach 2/7 Tolerated PS for a few hours yesterday then had heavy secretions, sweating requiring PRVC again. 1 x fever yesterday am before starting abx (ceftriaxone) 2/8 awake. Resp culture also growing PSA so abx changed to cefepime. Looks comfortable on PSV but got agitated at HS req'd freq PRNs 2/9 added HS melatonin. Cont to work on weaning. Further adjustment of glycemic control  2/11 PEG placement     Vitals:   09/24/23 1132 09/24/23 1200 09/24/23 1300 09/24/23 1305  BP:  (!) 151/81 128/66 (!) 152/116  Pulse: (!) 56 72 76 88  Resp: (!) 28 (!) 23 16 (!) 22  Temp:      TempSrc:      SpO2: 96% 92% 98% 100%  Weight:      Height:         Discharge Labs  BMET Recent Labs  Lab 09/18/23 0330 09/19/23 0346 09/20/23 0412 09/22/23 0619 09/24/23 0409  NA 143 142 138 140 134*  K 3.6 4.3 4.2 4.1 4.8  CL 102 100 95* 101 94*  CO2 30 31 32 30 27  GLUCOSE 217* 265* 253* 207* 111*  BUN 33* 27* 30* 28* 31*  CREATININE 0.53 0.55 0.58 0.52  0.46  CALCIUM 8.4* 8.6* 8.4* 8.5* 8.8*  MG 2.4 2.2 2.1 2.2  --   PHOS 3.9 3.1 3.4  --   --      CBC  Recent Labs  Lab 09/20/23 0412 09/22/23 0619 09/24/23 0409  HGB 10.4* 10.0* 9.5*  HCT 33.6* 32.0* 31.1*  WBC 12.6* 10.5 15.3*  PLT 234 276 278   Anti-Coagulation Recent Labs  Lab 09/24/23 0409  INR 1.3*            Allergies as of 09/24/2023       Reactions   Firvanq [vancomycin] Other (See Comments)   Vancomycin infusion related reaction- May need Vanc to be given at a slower rate    Hibiclens [chlorhexidine Gluconate] Itching   Diamox [acetazolamide] Rash        Medication List     STOP taking these medications    apixaban 5 MG Tabs tablet Commonly known as: ELIQUIS   calcium carbonate 500 MG chewable tablet Commonly known as: TUMS - dosed in mg elemental calcium   Combivent Respimat 20-100 MCG/ACT Aers respimat Generic drug: Ipratropium-Albuterol Replaced by: ipratropium-albuterol 0.5-2.5 (3) MG/3ML Soln   divalproex 125 MG DR tablet Commonly known as: DEPAKOTE   divalproex 500 MG DR tablet Commonly known as: DEPAKOTE   empagliflozin 10 MG Tabs tablet Commonly known as: JARDIANCE   ergocalciferol 1.25 MG (50000 UT) capsule Commonly known as: VITAMIN D2   Eucerin Skin Calming Crea   LORazepam 0.5 MG tablet Commonly known as: ATIVAN   metFORMIN 750 MG 24 hr tablet Commonly known as: GLUCOPHAGE-XR   methocarbamol 500 MG tablet Commonly known as: ROBAXIN   OXYGEN   Pro-Stat AWC Liqd   PSYLLIUM HUSK PO   tamsulosin 0.4 MG Caps capsule Commonly known as: FLOMAX       TAKE these medications    acetaminophen 160 MG/5ML solution Commonly known as: TYLENOL Take 20.3 mLs (650 mg total) by mouth every 6 (six) hours as needed for fever.   atorvastatin 10 MG tablet Commonly known as: LIPITOR Place 1 tablet (10 mg total) into feeding tube at bedtime. What changed: how to take this   carvedilol 3.125 MG tablet Commonly known  as: COREG Place 1 tablet (3.125 mg total) into feeding tube 2 (two) times daily with a meal. What changed: how to take this   ceFEPIme 2 g in sodium chloride 0.9 % 100 mL Inject 2 g into the vein every 8 (eight) hours.   enoxaparin 80 MG/0.8ML injection Commonly known as: LOVENOX Inject 0.8 mLs (80 mg total) into the skin every 12 (twelve) hours. Start taking on: September 25, 2023   famotidine 20 MG tablet Commonly known as: PEPCID Place 1 tablet (20 mg total) into feeding tube 2 (two) times daily.   feeding supplement (OSMOLITE 1.5 CAL) Liqd  Place 1,000 mLs into feeding tube continuous.   nutrition supplement (JUVEN) Pack Place 1 packet into feeding tube 2 (two) times daily between meals.   feeding supplement (PROSource TF20) liquid Place 60 mLs into feeding tube daily. Start taking on: September 25, 2023   fentaNYL 50 MCG/ML injection Commonly known as: SUBLIMAZE Inject 0.5-2 mLs (25-100 mcg total) into the vein every 2 (two) hours as needed for severe pain (pain score 7-10).   insulin aspart 100 UNIT/ML injection Commonly known as: novoLOG Inject 0-15 Units into the skin every 4 (four) hours.   insulin aspart 100 UNIT/ML injection Commonly known as: novoLOG Inject 8 Units into the skin every 4 (four) hours.   insulin glargine-yfgn 100 UNIT/ML injection Commonly known as: SEMGLEE Inject 0.15 mLs (15 Units total) into the skin 2 (two) times daily.   ipratropium-albuterol 0.5-2.5 (3) MG/3ML Soln Commonly known as: DUONEB Take 3 mLs by nebulization every 6 (six) hours as needed. Replaces: Combivent Respimat 20-100 MCG/ACT Aers respimat   melatonin 3 MG Tabs tablet Place 1 tablet (3 mg total) into feeding tube at bedtime.   miconazole nitrate Powd Commonly known as: MICATIN Apply 1 Application topically 2 (two) times daily as needed (Erythema/intertrigo/irritation). What changed:  when to take this reasons to take this additional instructions   modafinil 200 MG  tablet Commonly known as: PROVIGIL Place 1 tablet (200 mg total) into feeding tube daily. Start taking on: September 25, 2023   mouth rinse Liqd solution 15 mLs by Mouth Rinse route as needed (oral care).   mouth rinse Liqd solution 15 mLs by Mouth Rinse route every 2 (two) hours.   risperiDONE 3 MG tablet Commonly known as: RISPERDAL Place 1 tablet (3 mg total) into feeding tube at bedtime. What changed:  how to take this Another medication with the same name was removed. Continue taking this medication, and follow the directions you see here.   spironolactone 25 MG tablet Commonly known as: ALDACTONE Place 1 tablet (25 mg total) into feeding tube daily. Start taking on: September 25, 2023 What changed:  how much to take how to take this   valproic acid 250 MG/5ML solution Commonly known as: DEPAKENE Place 10 mLs (500 mg total) into feeding tube 2 (two) times daily.           Disposition: Discharge disposition: 02-Transferred to [facility]     [facility] - select   Discharged Condition: Quaneshia Wareing has met maximum benefit of inpatient care and is medically stable and cleared for discharge.  Patient is pending follow up as above.      Time spent on disposition:  Greater than 35 minutes.   SignedDirk Dress, NP 09/24/2023  1:16 PM Pager: [phone] or [phone]

## 2023-09-24 NOTE — Sedation Documentation (Signed)
Megan A, RT present in room during procedure.

## 2023-09-24 NOTE — Procedures (Signed)
Interventional Radiology Procedure Note  Procedure: Gastrostomy tube placement  Complications: None  Estimated Blood Loss: < 5 mL  Findings: 20 Fr bumper retention gastrostomy tube placed with tip in body of stomach. OK to use in 24 hours.  [name]. Fredia Sorrow, M.D Pager:  [phone]

## 2023-09-24 NOTE — Progress Notes (Signed)
NAME:  Becky Hendricks, MRN:  [id number], DOB:  1946/02/22, LOS: 12 ADMISSION DATE:  09/12/2023 CONSULTATION DATE:  09/12/2023 REFERRING MD:  Silverio Lay - EDP, CHIEF COMPLAINT:  Unresponsiveness   History of Present Illness:  78 year old woman who presented to [facility] ED 1/30 after she was found unresponsive in her wheelchair at her facility with reported SpO2 75%. PMHx significant for HTN, HLD, PAF, prior cardiac arrest, AoC respiratory failure with hypoxia/hypercapnia (on 3L HOT), T2DM, osteomyelitis, C. Diff, bipolar disorder, functional quadriplegia. Recent presentation to [facility] ED 07/19/2023 for episode of unresponsiveness. Admitted with COVID 03/2023.  History is obtained primarily from chart review. Per EMS, patient was found unresponsive in her wheelchair at her facility with SpO2 75%. Placed on NRB and intubated on ED arrival. Labs were notable for WBC 11.3, Hgb 13.5, Plt 145. Na 141, K 4.8, CO2 32, Cr 0.63 (baseline), LFTs WNL. VBG 7.344/pCO2 65.9/bicarb 35.9. LA 0.8. VPA level WNL. Post-intubation ABG 7.384/pCO2 53.6/pO2 293/bicarb 32. CXR with low lung volumes, no active disease. AXR nonobstructive bowel gas pattern. CT Head NAICA.  PCCM consulted for ICU admission.  Pertinent Medical History:   Past Medical History:  Diagnosis Date   Acute on chronic respiratory failure with hypoxia and hypercapnia (HCC)    Acute respiratory failure (HCC) 03/29/2021   Atrial fibrillation (HCC)    Bipolar disorder (HCC) 04/27/2020   Cardiac arrest (HCC)    Clostridium difficile diarrhea 08/02/2019   Colitis 03/29/2021   Dehydration    Drug rash    Encephalopathy acute 11/18/2020   Essential hypertension    Fall 02/11/2020   Hypoxia    Morbid obesity (HCC)    Nausea and vomiting 11/17/2020   Osteomyelitis (HCC) 12/03/2019   Pneumonia due to COVID-19 virus 09/15/2019   Pneumonia of lower lobe due to infectious organism 03/29/2021   Severe sepsis (HCC) 12/03/2019   SIRS (systemic inflammatory response  syndrome) (HCC) 11/17/2020   Type 2 diabetes mellitus (HCC)    Urinary tract infection without hematuria 09/15/2019   Weakness    Significant Hospital Events: Including procedures, antibiotic start and stop dates in addition to other pertinent events   1/30 - Presented from facility after being found unresponsive in wheelchair hypoxic to 75%. Intubated in ED. 1/30 RVP>> RSV 2/1 reintubated after aspiration event from dinner 2/5 s/p trach 2/7 Tolerated PS for a few hours yesterday then had heavy secretions, sweating requiring PRVC again. 1 x fever yesterday am before starting abx (ceftriaxone) 2/8 awake. Resp culture also growing PSA so abx changed to cefepime. Looks comfortable on PSV but got agitated at HS req'd freq PRNs 2/9 added HS melatonin. Cont to work on weaning. Further adjustment of glycemic control   Interim History / Subjective:  Febrile overnight, otherwise no acute change  Awaiting PEG placement in IR today   Objective:  Blood pressure 96/63, pulse (!) 56, temperature 98.7 F (37.1 C), temperature source Axillary, resp. rate 20, height 5\' 6"  (1.676 m), weight 88.4 kg, SpO2 96%.    Vent Mode: PRVC FiO2 (%):  [30 %] 30 % Set Rate:  [16 bmp] 16 bmp Vt Set:  [410 mL] 410 mL PEEP:  [5 cmH20] 5 cmH20 Plateau Pressure:  [16 cmH20-23 cmH20] 20 cmH20   Intake/Output Summary (Last 24 hours) at 09/24/2023 0900 Last data filed at 09/24/2023 0600 Gross per 24 hour  Intake 1050.07 ml  Output 1000 ml  Net 50.07 ml   Filed Weights   09/22/23 0435 09/23/23 0500 09/24/23 0500  Weight: 89  kg 88.8 kg 88.4 kg   Physical Examination: Gen:      Chronically ill appearing female, NAD in bed  HEENT:  mm moist, trach intact, few dried secretions  Lungs:    resps even non labored on vent, diminished bases few scattered rhonchi  CV:         Regular rate and rhythm; no murmurs Abd:      + bowel sounds; soft, non-tender; no palpable masses, no distension Ext:    right hand contracted,  tremors, bilat foot drop  Skin:      Warm and dry; no rash Neuro: somnolent, follows simple commands intermittently    Assessment & Plan:  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: initially Due to untreated OHS, recurrent aspiration; RSV infection and polypharmacy in setting of overall background is FTT recurrent admissions and ambulatory failure Now trach dep (2/5) -extubated 1/31 and reintubated on 2/1; Trached 2/5 Plan Continue PSV as tolerated, and assess for ATC daily VAP bundle Holding wean 2/11 with planned PEG placement  Minimize sedation  Proteus and Pseudomonas HCAP RSV Abx widened to cover for PSA on 2/8 Plan Day 4 of 7 cefepime   ICU delirium/encephalopathy superimposed on bipolar disorder  -on-going and intermittent, labile  -delirium seems slightly improved but more drowsy  Plan Continue Risperdal via tube at at bedtime Continue valproic acid twice daily Continue Provigil daily Delirium precautions  melatonin at HS   H/o,Paroxysmal Afib, HFpEF, HTN and HLD Plan Continue telemetry monitoring Continuing therapeutic Lovenox, hold off on resuming DOAC until after PEG Continue Lipitor via tube, Coreg via tube Continue spironolactone   T2DM Plan Continue sliding scale insulinGoal glucose 140-180 Tube feed coverage at 5 units NovoLog every 4 hours continue Semglee 15 bid  May need to increase basal insulin - holding for now with TF on hold for PEG   Functional Quad Plan Supportive care  Needs LTAC vs SNF  Dysphagia Plan Continue tube feeds IR consult for PEG   Best Practice (right click and "Reselect all SmartList Selections" daily)   Diet/type: tubefeeds DVT prophylaxis: SCDs Start: 09/12/23 1731 and therapeutic Lovenox   Pressure ulcers present: N/A GI prophylaxis: H2B Lines: N/A Foley:  N/A Code Status:  full code Last date of multidisciplinary goals of care discussion [pending]  The patient is critically ill with multiple  organ system failure and requires high complexity decision making for assessment and support, frequent evaluation and titration of therapies, advanced monitoring, review of radiographic studies and interpretation of complex data.   Critical Care Time devoted to patient care services, exclusive of separately billable procedures, described in this note is 32 minutes.   Dirk Dress, NP Pulmonary/Critical Care Medicine  09/24/2023  9:00 AM   See Amion for personal pager PCCM on call pager [phone] until 7pm. Please call Elink 7p-7a. [phone]

## 2023-09-25 DIAGNOSIS — J9621 Acute and chronic respiratory failure with hypoxia: Secondary | ICD-10-CM | POA: Diagnosis not present

## 2023-09-25 DIAGNOSIS — Z930 Tracheostomy status: Secondary | ICD-10-CM | POA: Diagnosis not present

## 2023-09-25 DIAGNOSIS — J1282 Pneumonia due to coronavirus disease 2019: Secondary | ICD-10-CM | POA: Diagnosis not present

## 2023-09-25 DIAGNOSIS — R6520 Severe sepsis without septic shock: Secondary | ICD-10-CM

## 2023-09-25 DIAGNOSIS — I482 Chronic atrial fibrillation, unspecified: Secondary | ICD-10-CM | POA: Diagnosis not present

## 2023-09-25 LAB — CBC
HCT: 29 % — ABNORMAL LOW (ref 36.0–46.0)
Hemoglobin: 9.2 g/dL — ABNORMAL LOW (ref 12.0–15.0)
MCH: 30.3 pg (ref 26.0–34.0)
MCHC: 31.7 g/dL (ref 30.0–36.0)
MCV: 95.4 fL (ref 80.0–100.0)
Platelets: 293 10*3/uL (ref 150–400)
RBC: 3.04 MIL/uL — ABNORMAL LOW (ref 3.87–5.11)
RDW: 14 % (ref 11.5–15.5)
WBC: 15.7 10*3/uL — ABNORMAL HIGH (ref 4.0–10.5)
nRBC: 0 % (ref 0.0–0.2)

## 2023-09-25 LAB — COMPREHENSIVE METABOLIC PANEL
ALT: 65 U/L — ABNORMAL HIGH (ref 0–44)
AST: 35 U/L (ref 15–41)
Albumin: 1.6 g/dL — ABNORMAL LOW (ref 3.5–5.0)
Alkaline Phosphatase: 36 U/L — ABNORMAL LOW (ref 38–126)
Anion gap: 15 (ref 5–15)
BUN: 27 mg/dL — ABNORMAL HIGH (ref 8–23)
CO2: 27 mmol/L (ref 22–32)
Calcium: 9 mg/dL (ref 8.9–10.3)
Chloride: 93 mmol/L — ABNORMAL LOW (ref 98–111)
Creatinine, Ser: 0.54 mg/dL (ref 0.44–1.00)
GFR, Estimated: >60 mL/min (ref >60)
Glucose, Bld: 119 mg/dL — ABNORMAL HIGH (ref 70–99)
Potassium: 4.5 mmol/L (ref 3.5–5.1)
Sodium: 135 mmol/L (ref 135–145)
Total Bilirubin: 0.5 mg/dL (ref 0.0–1.2)
Total Protein: 6.4 g/dL — ABNORMAL LOW (ref 6.5–8.1)

## 2023-09-25 LAB — BLOOD GAS, ARTERIAL
Acid-Base Excess: 7.7 mmol/L — ABNORMAL HIGH (ref 0.0–2.0)
Bicarbonate: 32.7 mmol/L — ABNORMAL HIGH (ref 20.0–28.0)
O2 Saturation: 99.1 %
Patient temperature: 36.7
pCO2 arterial: 45 mm[Hg] (ref 32–48)
pH, Arterial: 7.46 — ABNORMAL HIGH (ref 7.35–7.45)
pO2, Arterial: 103 mm[Hg] (ref 83–108)

## 2023-09-25 LAB — HEMOGLOBIN A1C
Hgb A1c MFr Bld: 7 % — ABNORMAL HIGH (ref 4.8–5.6)
Mean Plasma Glucose: 154.2 mg/dL

## 2023-09-25 LAB — C DIFFICILE QUICK SCREEN W PCR REFLEX
C Diff antigen: NEGATIVE
C Diff interpretation: NOT DETECTED
C Diff toxin: NEGATIVE

## 2023-09-26 DIAGNOSIS — J9621 Acute and chronic respiratory failure with hypoxia: Secondary | ICD-10-CM

## 2023-09-26 DIAGNOSIS — J1282 Pneumonia due to coronavirus disease 2019: Secondary | ICD-10-CM

## 2023-09-26 DIAGNOSIS — R6520 Severe sepsis without septic shock: Secondary | ICD-10-CM

## 2023-09-26 DIAGNOSIS — Z930 Tracheostomy status: Secondary | ICD-10-CM

## 2023-09-26 DIAGNOSIS — I482 Chronic atrial fibrillation, unspecified: Secondary | ICD-10-CM

## 2023-09-27 DIAGNOSIS — Z930 Tracheostomy status: Secondary | ICD-10-CM | POA: Diagnosis not present

## 2023-09-27 DIAGNOSIS — J9621 Acute and chronic respiratory failure with hypoxia: Secondary | ICD-10-CM | POA: Diagnosis not present

## 2023-09-27 DIAGNOSIS — I482 Chronic atrial fibrillation, unspecified: Secondary | ICD-10-CM | POA: Diagnosis not present

## 2023-09-27 DIAGNOSIS — R6520 Severe sepsis without septic shock: Secondary | ICD-10-CM

## 2023-09-27 DIAGNOSIS — J1282 Pneumonia due to coronavirus disease 2019: Secondary | ICD-10-CM | POA: Diagnosis not present

## 2023-09-28 DIAGNOSIS — Z930 Tracheostomy status: Secondary | ICD-10-CM | POA: Diagnosis not present

## 2023-09-28 DIAGNOSIS — J9621 Acute and chronic respiratory failure with hypoxia: Secondary | ICD-10-CM | POA: Diagnosis not present

## 2023-09-28 DIAGNOSIS — I482 Chronic atrial fibrillation, unspecified: Secondary | ICD-10-CM | POA: Diagnosis not present

## 2023-09-28 DIAGNOSIS — R6520 Severe sepsis without septic shock: Secondary | ICD-10-CM

## 2023-09-28 DIAGNOSIS — J1282 Pneumonia due to coronavirus disease 2019: Secondary | ICD-10-CM | POA: Diagnosis not present

## 2023-09-29 DIAGNOSIS — I482 Chronic atrial fibrillation, unspecified: Secondary | ICD-10-CM

## 2023-09-29 DIAGNOSIS — Z930 Tracheostomy status: Secondary | ICD-10-CM

## 2023-09-29 DIAGNOSIS — J9621 Acute and chronic respiratory failure with hypoxia: Secondary | ICD-10-CM

## 2023-09-29 DIAGNOSIS — J1282 Pneumonia due to coronavirus disease 2019: Secondary | ICD-10-CM

## 2023-09-29 DIAGNOSIS — R6520 Severe sepsis without septic shock: Secondary | ICD-10-CM

## 2023-09-29 LAB — CBC WITH DIFFERENTIAL/PLATELET
Abs Immature Granulocytes: 0.11 10*3/uL — ABNORMAL HIGH (ref 0.00–0.07)
Basophils Absolute: 0.1 10*3/uL (ref 0.0–0.1)
Basophils Relative: 1 %
Eosinophils Absolute: 0.3 10*3/uL (ref 0.0–0.5)
Eosinophils Relative: 3 %
HCT: 30.4 % — ABNORMAL LOW (ref 36.0–46.0)
Hemoglobin: 9.7 g/dL — ABNORMAL LOW (ref 12.0–15.0)
Immature Granulocytes: 1 %
Lymphocytes Relative: 34 %
Lymphs Abs: 4 10*3/uL (ref 0.7–4.0)
MCH: 30.3 pg (ref 26.0–34.0)
MCHC: 31.9 g/dL (ref 30.0–36.0)
MCV: 95 fL (ref 80.0–100.0)
Monocytes Absolute: 1.1 10*3/uL — ABNORMAL HIGH (ref 0.1–1.0)
Monocytes Relative: 10 %
Neutro Abs: 6.1 10*3/uL (ref 1.7–7.7)
Neutrophils Relative %: 51 %
Platelets: 328 10*3/uL (ref 150–400)
RBC: 3.2 MIL/uL — ABNORMAL LOW (ref 3.87–5.11)
RDW: 14.2 % (ref 11.5–15.5)
WBC: 11.7 10*3/uL — ABNORMAL HIGH (ref 4.0–10.5)
nRBC: 0 % (ref 0.0–0.2)

## 2023-09-29 LAB — BASIC METABOLIC PANEL
Anion gap: 9 (ref 5–15)
BUN: 17 mg/dL (ref 8–23)
CO2: 28 mmol/L (ref 22–32)
Calcium: 9.1 mg/dL (ref 8.9–10.3)
Chloride: 97 mmol/L — ABNORMAL LOW (ref 98–111)
Creatinine, Ser: 0.55 mg/dL (ref 0.44–1.00)
GFR, Estimated: >60 mL/min (ref >60)
Glucose, Bld: 127 mg/dL — ABNORMAL HIGH (ref 70–99)
Potassium: 4.4 mmol/L (ref 3.5–5.1)
Sodium: 134 mmol/L — ABNORMAL LOW (ref 135–145)

## 2023-09-30 DIAGNOSIS — J9621 Acute and chronic respiratory failure with hypoxia: Secondary | ICD-10-CM | POA: Diagnosis not present

## 2023-09-30 DIAGNOSIS — R6520 Severe sepsis without septic shock: Secondary | ICD-10-CM

## 2023-09-30 DIAGNOSIS — J1282 Pneumonia due to coronavirus disease 2019: Secondary | ICD-10-CM | POA: Diagnosis not present

## 2023-09-30 DIAGNOSIS — I482 Chronic atrial fibrillation, unspecified: Secondary | ICD-10-CM | POA: Diagnosis not present

## 2023-09-30 DIAGNOSIS — Z930 Tracheostomy status: Secondary | ICD-10-CM | POA: Diagnosis not present

## 2023-10-01 DIAGNOSIS — I482 Chronic atrial fibrillation, unspecified: Secondary | ICD-10-CM | POA: Diagnosis not present

## 2023-10-01 DIAGNOSIS — R6520 Severe sepsis without septic shock: Secondary | ICD-10-CM

## 2023-10-01 DIAGNOSIS — J1282 Pneumonia due to coronavirus disease 2019: Secondary | ICD-10-CM | POA: Diagnosis not present

## 2023-10-01 DIAGNOSIS — J9621 Acute and chronic respiratory failure with hypoxia: Secondary | ICD-10-CM | POA: Diagnosis not present

## 2023-10-01 DIAGNOSIS — Z930 Tracheostomy status: Secondary | ICD-10-CM | POA: Diagnosis not present

## 2023-10-02 DIAGNOSIS — J9621 Acute and chronic respiratory failure with hypoxia: Secondary | ICD-10-CM

## 2023-10-02 DIAGNOSIS — I482 Chronic atrial fibrillation, unspecified: Secondary | ICD-10-CM

## 2023-10-02 DIAGNOSIS — Z930 Tracheostomy status: Secondary | ICD-10-CM

## 2023-10-02 DIAGNOSIS — R6520 Severe sepsis without septic shock: Secondary | ICD-10-CM

## 2023-10-02 DIAGNOSIS — J1282 Pneumonia due to coronavirus disease 2019: Secondary | ICD-10-CM

## 2023-10-02 LAB — BASIC METABOLIC PANEL
Anion gap: 9 (ref 5–15)
BUN: 21 mg/dL (ref 8–23)
CO2: 29 mmol/L (ref 22–32)
Calcium: 8.9 mg/dL (ref 8.9–10.3)
Chloride: 95 mmol/L — ABNORMAL LOW (ref 98–111)
Creatinine, Ser: 0.74 mg/dL (ref 0.44–1.00)
GFR, Estimated: >60 mL/min (ref >60)
Glucose, Bld: 166 mg/dL — ABNORMAL HIGH (ref 70–99)
Potassium: 4.5 mmol/L (ref 3.5–5.1)
Sodium: 133 mmol/L — ABNORMAL LOW (ref 135–145)

## 2023-10-02 LAB — CBC
HCT: 31.6 % — ABNORMAL LOW (ref 36.0–46.0)
Hemoglobin: 9.9 g/dL — ABNORMAL LOW (ref 12.0–15.0)
MCH: 29.6 pg (ref 26.0–34.0)
MCHC: 31.3 g/dL (ref 30.0–36.0)
MCV: 94.3 fL (ref 80.0–100.0)
Platelets: 324 10*3/uL (ref 150–400)
RBC: 3.35 MIL/uL — ABNORMAL LOW (ref 3.87–5.11)
RDW: 14.3 % (ref 11.5–15.5)
WBC: 10.3 10*3/uL (ref 4.0–10.5)
nRBC: 0 % (ref 0.0–0.2)

## 2023-10-03 DIAGNOSIS — Z930 Tracheostomy status: Secondary | ICD-10-CM

## 2023-10-03 DIAGNOSIS — J9621 Acute and chronic respiratory failure with hypoxia: Secondary | ICD-10-CM

## 2023-10-03 DIAGNOSIS — J1282 Pneumonia due to coronavirus disease 2019: Secondary | ICD-10-CM

## 2023-10-03 DIAGNOSIS — I482 Chronic atrial fibrillation, unspecified: Secondary | ICD-10-CM

## 2023-10-04 ENCOUNTER — Other Ambulatory Visit (HOSPITAL_COMMUNITY): Payer: Medicare Other

## 2023-10-04 DIAGNOSIS — J9621 Acute and chronic respiratory failure with hypoxia: Secondary | ICD-10-CM

## 2023-10-04 DIAGNOSIS — I482 Chronic atrial fibrillation, unspecified: Secondary | ICD-10-CM

## 2023-10-04 DIAGNOSIS — Z930 Tracheostomy status: Secondary | ICD-10-CM

## 2023-10-04 DIAGNOSIS — J1282 Pneumonia due to coronavirus disease 2019: Secondary | ICD-10-CM

## 2023-10-04 DIAGNOSIS — R6520 Severe sepsis without septic shock: Secondary | ICD-10-CM

## 2023-10-05 DIAGNOSIS — J9621 Acute and chronic respiratory failure with hypoxia: Secondary | ICD-10-CM

## 2023-10-05 DIAGNOSIS — R6520 Severe sepsis without septic shock: Secondary | ICD-10-CM

## 2023-10-05 DIAGNOSIS — I482 Chronic atrial fibrillation, unspecified: Secondary | ICD-10-CM

## 2023-10-05 DIAGNOSIS — Z930 Tracheostomy status: Secondary | ICD-10-CM

## 2023-10-05 DIAGNOSIS — J1282 Pneumonia due to coronavirus disease 2019: Secondary | ICD-10-CM

## 2023-10-05 LAB — CBC
HCT: 30.9 % — ABNORMAL LOW (ref 36.0–46.0)
Hemoglobin: 9.8 g/dL — ABNORMAL LOW (ref 12.0–15.0)
MCH: 29.6 pg (ref 26.0–34.0)
MCHC: 31.7 g/dL (ref 30.0–36.0)
MCV: 93.4 fL (ref 80.0–100.0)
Platelets: 262 10*3/uL (ref 150–400)
RBC: 3.31 MIL/uL — ABNORMAL LOW (ref 3.87–5.11)
RDW: 14.1 % (ref 11.5–15.5)
WBC: 14.7 10*3/uL — ABNORMAL HIGH (ref 4.0–10.5)
nRBC: 0 % (ref 0.0–0.2)

## 2023-10-05 LAB — BASIC METABOLIC PANEL
Anion gap: 13 (ref 5–15)
BUN: 29 mg/dL — ABNORMAL HIGH (ref 8–23)
CO2: 27 mmol/L (ref 22–32)
Calcium: 8.6 mg/dL — ABNORMAL LOW (ref 8.9–10.3)
Chloride: 91 mmol/L — ABNORMAL LOW (ref 98–111)
Creatinine, Ser: 0.58 mg/dL (ref 0.44–1.00)
GFR, Estimated: >60 mL/min (ref >60)
Glucose, Bld: 186 mg/dL — ABNORMAL HIGH (ref 70–99)
Potassium: 4.8 mmol/L (ref 3.5–5.1)
Sodium: 131 mmol/L — ABNORMAL LOW (ref 135–145)

## 2023-10-05 LAB — MAGNESIUM: Magnesium: 2.1 mg/dL (ref 1.7–2.4)

## 2023-10-06 DIAGNOSIS — R6520 Severe sepsis without septic shock: Secondary | ICD-10-CM

## 2023-10-06 DIAGNOSIS — J1282 Pneumonia due to coronavirus disease 2019: Secondary | ICD-10-CM

## 2023-10-06 DIAGNOSIS — Z930 Tracheostomy status: Secondary | ICD-10-CM

## 2023-10-06 DIAGNOSIS — I482 Chronic atrial fibrillation, unspecified: Secondary | ICD-10-CM

## 2023-10-06 DIAGNOSIS — J9621 Acute and chronic respiratory failure with hypoxia: Secondary | ICD-10-CM

## 2023-10-06 LAB — BASIC METABOLIC PANEL
Anion gap: 17 — ABNORMAL HIGH (ref 5–15)
BUN: 26 mg/dL — ABNORMAL HIGH (ref 8–23)
CO2: 27 mmol/L (ref 22–32)
Calcium: 9.1 mg/dL (ref 8.9–10.3)
Chloride: 88 mmol/L — ABNORMAL LOW (ref 98–111)
Creatinine, Ser: 0.48 mg/dL (ref 0.44–1.00)
GFR, Estimated: >60 mL/min (ref >60)
Glucose, Bld: 170 mg/dL — ABNORMAL HIGH (ref 70–99)
Potassium: 4.7 mmol/L (ref 3.5–5.1)
Sodium: 132 mmol/L — ABNORMAL LOW (ref 135–145)

## 2023-10-06 LAB — CBC WITH DIFFERENTIAL/PLATELET
Abs Immature Granulocytes: 0.11 10*3/uL — ABNORMAL HIGH (ref 0.00–0.07)
Basophils Absolute: 0.1 10*3/uL (ref 0.0–0.1)
Basophils Relative: 1 %
Eosinophils Absolute: 0.2 10*3/uL (ref 0.0–0.5)
Eosinophils Relative: 2 %
HCT: 31.2 % — ABNORMAL LOW (ref 36.0–46.0)
Hemoglobin: 10 g/dL — ABNORMAL LOW (ref 12.0–15.0)
Immature Granulocytes: 1 %
Lymphocytes Relative: 31 %
Lymphs Abs: 4 10*3/uL (ref 0.7–4.0)
MCH: 29.9 pg (ref 26.0–34.0)
MCHC: 32.1 g/dL (ref 30.0–36.0)
MCV: 93.4 fL (ref 80.0–100.0)
Monocytes Absolute: 1.8 10*3/uL — ABNORMAL HIGH (ref 0.1–1.0)
Monocytes Relative: 14 %
Neutro Abs: 6.6 10*3/uL (ref 1.7–7.7)
Neutrophils Relative %: 51 %
Platelets: 236 10*3/uL (ref 150–400)
RBC: 3.34 MIL/uL — ABNORMAL LOW (ref 3.87–5.11)
RDW: 14 % (ref 11.5–15.5)
WBC: 12.7 10*3/uL — ABNORMAL HIGH (ref 4.0–10.5)
nRBC: 0 % (ref 0.0–0.2)

## 2023-10-07 DIAGNOSIS — R6520 Severe sepsis without septic shock: Secondary | ICD-10-CM

## 2023-10-07 DIAGNOSIS — Z930 Tracheostomy status: Secondary | ICD-10-CM

## 2023-10-07 DIAGNOSIS — J9621 Acute and chronic respiratory failure with hypoxia: Secondary | ICD-10-CM

## 2023-10-07 DIAGNOSIS — I482 Chronic atrial fibrillation, unspecified: Secondary | ICD-10-CM

## 2023-10-07 DIAGNOSIS — J1282 Pneumonia due to coronavirus disease 2019: Secondary | ICD-10-CM

## 2023-10-08 DIAGNOSIS — R6520 Severe sepsis without septic shock: Secondary | ICD-10-CM

## 2023-10-08 DIAGNOSIS — J1282 Pneumonia due to coronavirus disease 2019: Secondary | ICD-10-CM

## 2023-10-08 DIAGNOSIS — J9621 Acute and chronic respiratory failure with hypoxia: Secondary | ICD-10-CM

## 2023-10-08 DIAGNOSIS — I482 Chronic atrial fibrillation, unspecified: Secondary | ICD-10-CM

## 2023-10-08 DIAGNOSIS — Z930 Tracheostomy status: Secondary | ICD-10-CM

## 2023-10-09 DIAGNOSIS — Z930 Tracheostomy status: Secondary | ICD-10-CM

## 2023-10-09 DIAGNOSIS — R6520 Severe sepsis without septic shock: Secondary | ICD-10-CM

## 2023-10-09 DIAGNOSIS — J1282 Pneumonia due to coronavirus disease 2019: Secondary | ICD-10-CM

## 2023-10-09 DIAGNOSIS — I482 Chronic atrial fibrillation, unspecified: Secondary | ICD-10-CM

## 2023-10-09 DIAGNOSIS — J9621 Acute and chronic respiratory failure with hypoxia: Secondary | ICD-10-CM

## 2023-10-10 DIAGNOSIS — R6520 Severe sepsis without septic shock: Secondary | ICD-10-CM

## 2023-10-10 DIAGNOSIS — J1282 Pneumonia due to coronavirus disease 2019: Secondary | ICD-10-CM

## 2023-10-10 DIAGNOSIS — Z930 Tracheostomy status: Secondary | ICD-10-CM

## 2023-10-10 DIAGNOSIS — J9621 Acute and chronic respiratory failure with hypoxia: Secondary | ICD-10-CM

## 2023-10-10 DIAGNOSIS — I482 Chronic atrial fibrillation, unspecified: Secondary | ICD-10-CM

## 2023-10-14 LAB — CBC WITH DIFFERENTIAL/PLATELET
Abs Immature Granulocytes: 0.24 10*3/uL — ABNORMAL HIGH (ref 0.00–0.07)
Basophils Absolute: 0.1 10*3/uL (ref 0.0–0.1)
Basophils Relative: 1 %
Eosinophils Absolute: 0.3 10*3/uL (ref 0.0–0.5)
Eosinophils Relative: 2 %
HCT: 33.7 % — ABNORMAL LOW (ref 36.0–46.0)
Hemoglobin: 10.7 g/dL — ABNORMAL LOW (ref 12.0–15.0)
Immature Granulocytes: 2 %
Lymphocytes Relative: 30 %
Lymphs Abs: 4.2 10*3/uL — ABNORMAL HIGH (ref 0.7–4.0)
MCH: 29 pg (ref 26.0–34.0)
MCHC: 31.8 g/dL (ref 30.0–36.0)
MCV: 91.3 fL (ref 80.0–100.0)
Monocytes Absolute: 2.1 10*3/uL — ABNORMAL HIGH (ref 0.1–1.0)
Monocytes Relative: 15 %
Neutro Abs: 7.3 10*3/uL (ref 1.7–7.7)
Neutrophils Relative %: 50 %
Platelets: 314 10*3/uL (ref 150–400)
RBC: 3.69 MIL/uL — ABNORMAL LOW (ref 3.87–5.11)
RDW: 14.3 % (ref 11.5–15.5)
WBC: 14.1 10*3/uL — ABNORMAL HIGH (ref 4.0–10.5)
nRBC: 0 % (ref 0.0–0.2)

## 2023-10-14 LAB — COMPREHENSIVE METABOLIC PANEL
ALT: 69 U/L — ABNORMAL HIGH (ref 0–44)
AST: 28 U/L (ref 15–41)
Albumin: 1.8 g/dL — ABNORMAL LOW (ref 3.5–5.0)
Alkaline Phosphatase: 50 U/L (ref 38–126)
Anion gap: 14 (ref 5–15)
BUN: 25 mg/dL — ABNORMAL HIGH (ref 8–23)
CO2: 27 mmol/L (ref 22–32)
Calcium: 9 mg/dL (ref 8.9–10.3)
Chloride: 91 mmol/L — ABNORMAL LOW (ref 98–111)
Creatinine, Ser: 0.48 mg/dL (ref 0.44–1.00)
GFR, Estimated: >60 mL/min (ref >60)
Glucose, Bld: 167 mg/dL — ABNORMAL HIGH (ref 70–99)
Potassium: 4.7 mmol/L (ref 3.5–5.1)
Sodium: 132 mmol/L — ABNORMAL LOW (ref 135–145)
Total Bilirubin: 0.5 mg/dL (ref 0.0–1.2)
Total Protein: 7.5 g/dL (ref 6.5–8.1)

## 2023-10-14 LAB — BLOOD GAS, ARTERIAL
Acid-Base Excess: 7.4 mmol/L — ABNORMAL HIGH (ref 0.0–2.0)
Bicarbonate: 31.2 mmol/L — ABNORMAL HIGH (ref 20.0–28.0)
O2 Saturation: 97.9 %
Patient temperature: 37
pCO2 arterial: 40 mmHg (ref 32–48)
pH, Arterial: 7.5 — ABNORMAL HIGH (ref 7.35–7.45)
pO2, Arterial: 78 mmHg — ABNORMAL LOW (ref 83–108)

## 2023-10-14 LAB — MAGNESIUM: Magnesium: 2 mg/dL (ref 1.7–2.4)

## 2023-10-14 LAB — PHOSPHORUS: Phosphorus: 3.6 mg/dL (ref 2.5–4.6)

## 2023-10-17 ENCOUNTER — Other Ambulatory Visit (HOSPITAL_COMMUNITY)

## 2023-10-17 MED ORDER — IOHEXOL 350 MG/ML SOLN
75.0000 mL | Freq: Once | INTRAVENOUS | Status: AC | PRN
  Administered 2023-10-17: 75 mL via INTRAVENOUS

## 2023-10-19 ENCOUNTER — Other Ambulatory Visit (HOSPITAL_COMMUNITY)

## 2023-10-19 LAB — CBC
HCT: 34.6 % — ABNORMAL LOW (ref 36.0–46.0)
Hemoglobin: 11.1 g/dL — ABNORMAL LOW (ref 12.0–15.0)
MCH: 29.5 pg (ref 26.0–34.0)
MCHC: 32.1 g/dL (ref 30.0–36.0)
MCV: 92 fL (ref 80.0–100.0)
Platelets: 404 10*3/uL — ABNORMAL HIGH (ref 150–400)
RBC: 3.76 MIL/uL — ABNORMAL LOW (ref 3.87–5.11)
RDW: 14.7 % (ref 11.5–15.5)
WBC: 13.9 10*3/uL — ABNORMAL HIGH (ref 4.0–10.5)
nRBC: 0 % (ref 0.0–0.2)

## 2023-10-19 LAB — BASIC METABOLIC PANEL
Anion gap: 13 (ref 5–15)
BUN: 28 mg/dL — ABNORMAL HIGH (ref 8–23)
CO2: 28 mmol/L (ref 22–32)
Calcium: 8.6 mg/dL — ABNORMAL LOW (ref 8.9–10.3)
Chloride: 90 mmol/L — ABNORMAL LOW (ref 98–111)
Creatinine, Ser: 0.53 mg/dL (ref 0.44–1.00)
GFR, Estimated: >60 mL/min (ref >60)
Glucose, Bld: 185 mg/dL — ABNORMAL HIGH (ref 70–99)
Potassium: 4.8 mmol/L (ref 3.5–5.1)
Sodium: 131 mmol/L — ABNORMAL LOW (ref 135–145)

## 2023-10-19 LAB — MAGNESIUM: Magnesium: 2.1 mg/dL (ref 1.7–2.4)

## 2023-10-20 DIAGNOSIS — J1282 Pneumonia due to coronavirus disease 2019: Secondary | ICD-10-CM

## 2023-10-20 DIAGNOSIS — Z930 Tracheostomy status: Secondary | ICD-10-CM

## 2023-10-20 DIAGNOSIS — J9621 Acute and chronic respiratory failure with hypoxia: Secondary | ICD-10-CM

## 2023-10-20 DIAGNOSIS — I482 Chronic atrial fibrillation, unspecified: Secondary | ICD-10-CM

## 2023-10-20 DIAGNOSIS — R6520 Severe sepsis without septic shock: Secondary | ICD-10-CM

## 2023-10-21 DIAGNOSIS — J9621 Acute and chronic respiratory failure with hypoxia: Secondary | ICD-10-CM

## 2023-10-21 DIAGNOSIS — J1282 Pneumonia due to coronavirus disease 2019: Secondary | ICD-10-CM

## 2023-10-21 DIAGNOSIS — Z930 Tracheostomy status: Secondary | ICD-10-CM

## 2023-10-21 DIAGNOSIS — R6520 Severe sepsis without septic shock: Secondary | ICD-10-CM

## 2023-10-21 DIAGNOSIS — I482 Chronic atrial fibrillation, unspecified: Secondary | ICD-10-CM

## 2023-10-22 DIAGNOSIS — Z930 Tracheostomy status: Secondary | ICD-10-CM

## 2023-10-22 DIAGNOSIS — I482 Chronic atrial fibrillation, unspecified: Secondary | ICD-10-CM

## 2023-10-22 DIAGNOSIS — J1282 Pneumonia due to coronavirus disease 2019: Secondary | ICD-10-CM

## 2023-10-22 DIAGNOSIS — R6520 Severe sepsis without septic shock: Secondary | ICD-10-CM

## 2023-10-22 DIAGNOSIS — J9621 Acute and chronic respiratory failure with hypoxia: Secondary | ICD-10-CM

## 2023-10-22 LAB — BASIC METABOLIC PANEL
Anion gap: 7 (ref 5–15)
BUN: 20 mg/dL (ref 8–23)
CO2: 28 mmol/L (ref 22–32)
Calcium: 7.8 mg/dL — ABNORMAL LOW (ref 8.9–10.3)
Chloride: 94 mmol/L — ABNORMAL LOW (ref 98–111)
Creatinine, Ser: 0.55 mg/dL (ref 0.44–1.00)
GFR, Estimated: >60 mL/min (ref >60)
Glucose, Bld: 150 mg/dL — ABNORMAL HIGH (ref 70–99)
Potassium: 4 mmol/L (ref 3.5–5.1)
Sodium: 129 mmol/L — ABNORMAL LOW (ref 135–145)

## 2023-10-22 LAB — CBC
HCT: 28.8 % — ABNORMAL LOW (ref 36.0–46.0)
Hemoglobin: 9.2 g/dL — ABNORMAL LOW (ref 12.0–15.0)
MCH: 29.6 pg (ref 26.0–34.0)
MCHC: 31.9 g/dL (ref 30.0–36.0)
MCV: 92.6 fL (ref 80.0–100.0)
Platelets: 338 10*3/uL (ref 150–400)
RBC: 3.11 MIL/uL — ABNORMAL LOW (ref 3.87–5.11)
RDW: 14.8 % (ref 11.5–15.5)
WBC: 13.7 10*3/uL — ABNORMAL HIGH (ref 4.0–10.5)
nRBC: 0 % (ref 0.0–0.2)

## 2023-10-22 LAB — MAGNESIUM: Magnesium: 2.1 mg/dL (ref 1.7–2.4)

## 2023-10-23 DIAGNOSIS — J9621 Acute and chronic respiratory failure with hypoxia: Secondary | ICD-10-CM

## 2023-10-23 DIAGNOSIS — R6520 Severe sepsis without septic shock: Secondary | ICD-10-CM

## 2023-10-23 DIAGNOSIS — I482 Chronic atrial fibrillation, unspecified: Secondary | ICD-10-CM

## 2023-10-23 DIAGNOSIS — J1282 Pneumonia due to coronavirus disease 2019: Secondary | ICD-10-CM

## 2023-10-23 DIAGNOSIS — Z930 Tracheostomy status: Secondary | ICD-10-CM

## 2023-10-24 DIAGNOSIS — Z930 Tracheostomy status: Secondary | ICD-10-CM

## 2023-10-24 DIAGNOSIS — J9621 Acute and chronic respiratory failure with hypoxia: Secondary | ICD-10-CM

## 2023-10-24 DIAGNOSIS — I482 Chronic atrial fibrillation, unspecified: Secondary | ICD-10-CM

## 2023-10-24 DIAGNOSIS — R6520 Severe sepsis without septic shock: Secondary | ICD-10-CM

## 2023-10-24 DIAGNOSIS — J1282 Pneumonia due to coronavirus disease 2019: Secondary | ICD-10-CM

## 2023-10-25 DIAGNOSIS — R6520 Severe sepsis without septic shock: Secondary | ICD-10-CM

## 2023-10-25 DIAGNOSIS — Z930 Tracheostomy status: Secondary | ICD-10-CM

## 2023-10-25 DIAGNOSIS — I482 Chronic atrial fibrillation, unspecified: Secondary | ICD-10-CM

## 2023-10-25 DIAGNOSIS — J1282 Pneumonia due to coronavirus disease 2019: Secondary | ICD-10-CM

## 2023-10-25 DIAGNOSIS — J9621 Acute and chronic respiratory failure with hypoxia: Secondary | ICD-10-CM

## 2023-10-26 DIAGNOSIS — J1282 Pneumonia due to coronavirus disease 2019: Secondary | ICD-10-CM

## 2023-10-26 DIAGNOSIS — J9621 Acute and chronic respiratory failure with hypoxia: Secondary | ICD-10-CM

## 2023-10-26 DIAGNOSIS — I482 Chronic atrial fibrillation, unspecified: Secondary | ICD-10-CM

## 2023-10-26 DIAGNOSIS — Z930 Tracheostomy status: Secondary | ICD-10-CM

## 2023-10-26 DIAGNOSIS — R6520 Severe sepsis without septic shock: Secondary | ICD-10-CM

## 2023-10-27 DIAGNOSIS — J9621 Acute and chronic respiratory failure with hypoxia: Secondary | ICD-10-CM

## 2023-10-27 DIAGNOSIS — R6520 Severe sepsis without septic shock: Secondary | ICD-10-CM

## 2023-10-27 DIAGNOSIS — Z930 Tracheostomy status: Secondary | ICD-10-CM

## 2023-10-27 DIAGNOSIS — I482 Chronic atrial fibrillation, unspecified: Secondary | ICD-10-CM

## 2023-10-27 DIAGNOSIS — J1282 Pneumonia due to coronavirus disease 2019: Secondary | ICD-10-CM

## 2023-10-28 DIAGNOSIS — R6520 Severe sepsis without septic shock: Secondary | ICD-10-CM

## 2023-10-28 DIAGNOSIS — J9621 Acute and chronic respiratory failure with hypoxia: Secondary | ICD-10-CM

## 2023-10-28 DIAGNOSIS — J1282 Pneumonia due to coronavirus disease 2019: Secondary | ICD-10-CM

## 2023-10-28 DIAGNOSIS — I482 Chronic atrial fibrillation, unspecified: Secondary | ICD-10-CM

## 2023-10-28 DIAGNOSIS — Z930 Tracheostomy status: Secondary | ICD-10-CM

## 2023-10-28 LAB — CBC WITH DIFFERENTIAL/PLATELET
Abs Immature Granulocytes: 0.2 10*3/uL — ABNORMAL HIGH (ref 0.00–0.07)
Basophils Absolute: 0.1 10*3/uL (ref 0.0–0.1)
Basophils Relative: 1 %
Eosinophils Absolute: 0.5 10*3/uL (ref 0.0–0.5)
Eosinophils Relative: 5 %
HCT: 29.5 % — ABNORMAL LOW (ref 36.0–46.0)
Hemoglobin: 9.3 g/dL — ABNORMAL LOW (ref 12.0–15.0)
Immature Granulocytes: 2 %
Lymphocytes Relative: 39 %
Lymphs Abs: 4.1 10*3/uL — ABNORMAL HIGH (ref 0.7–4.0)
MCH: 29.8 pg (ref 26.0–34.0)
MCHC: 31.5 g/dL (ref 30.0–36.0)
MCV: 94.6 fL (ref 80.0–100.0)
Monocytes Absolute: 1.3 10*3/uL — ABNORMAL HIGH (ref 0.1–1.0)
Monocytes Relative: 12 %
Neutro Abs: 4.4 10*3/uL (ref 1.7–7.7)
Neutrophils Relative %: 41 %
Platelets: 289 10*3/uL (ref 150–400)
RBC: 3.12 MIL/uL — ABNORMAL LOW (ref 3.87–5.11)
RDW: 16.3 % — ABNORMAL HIGH (ref 11.5–15.5)
WBC: 10.5 10*3/uL (ref 4.0–10.5)
nRBC: 0 % (ref 0.0–0.2)

## 2023-10-28 LAB — COMPREHENSIVE METABOLIC PANEL
ALT: 15 U/L (ref 0–44)
AST: 14 U/L — ABNORMAL LOW (ref 15–41)
Albumin: 1.7 g/dL — ABNORMAL LOW (ref 3.5–5.0)
Alkaline Phosphatase: 40 U/L (ref 38–126)
Anion gap: 7 (ref 5–15)
BUN: 19 mg/dL (ref 8–23)
CO2: 28 mmol/L (ref 22–32)
Calcium: 8.4 mg/dL — ABNORMAL LOW (ref 8.9–10.3)
Chloride: 98 mmol/L (ref 98–111)
Creatinine, Ser: 0.52 mg/dL (ref 0.44–1.00)
GFR, Estimated: >60 mL/min (ref >60)
Glucose, Bld: 168 mg/dL — ABNORMAL HIGH (ref 70–99)
Potassium: 4.1 mmol/L (ref 3.5–5.1)
Sodium: 133 mmol/L — ABNORMAL LOW (ref 135–145)
Total Bilirubin: 0.4 mg/dL (ref 0.0–1.2)
Total Protein: 6.5 g/dL (ref 6.5–8.1)

## 2023-10-29 DIAGNOSIS — R6520 Severe sepsis without septic shock: Secondary | ICD-10-CM

## 2023-10-29 DIAGNOSIS — J9621 Acute and chronic respiratory failure with hypoxia: Secondary | ICD-10-CM

## 2023-10-29 DIAGNOSIS — Z930 Tracheostomy status: Secondary | ICD-10-CM

## 2023-10-29 DIAGNOSIS — I482 Chronic atrial fibrillation, unspecified: Secondary | ICD-10-CM

## 2023-10-29 DIAGNOSIS — J1282 Pneumonia due to coronavirus disease 2019: Secondary | ICD-10-CM

## 2023-10-30 DIAGNOSIS — J1282 Pneumonia due to coronavirus disease 2019: Secondary | ICD-10-CM

## 2023-10-30 DIAGNOSIS — Z930 Tracheostomy status: Secondary | ICD-10-CM

## 2023-10-30 DIAGNOSIS — I482 Chronic atrial fibrillation, unspecified: Secondary | ICD-10-CM

## 2023-10-30 DIAGNOSIS — J9621 Acute and chronic respiratory failure with hypoxia: Secondary | ICD-10-CM

## 2023-10-30 DIAGNOSIS — R6520 Severe sepsis without septic shock: Secondary | ICD-10-CM

## 2023-10-31 DIAGNOSIS — Z930 Tracheostomy status: Secondary | ICD-10-CM

## 2023-10-31 DIAGNOSIS — J1282 Pneumonia due to coronavirus disease 2019: Secondary | ICD-10-CM

## 2023-10-31 DIAGNOSIS — J9621 Acute and chronic respiratory failure with hypoxia: Secondary | ICD-10-CM

## 2023-10-31 DIAGNOSIS — R6520 Severe sepsis without septic shock: Secondary | ICD-10-CM

## 2023-10-31 DIAGNOSIS — I482 Chronic atrial fibrillation, unspecified: Secondary | ICD-10-CM

## 2023-11-01 LAB — BASIC METABOLIC PANEL
Anion gap: 7 (ref 5–15)
BUN: 29 mg/dL — ABNORMAL HIGH (ref 8–23)
CO2: 28 mmol/L (ref 22–32)
Calcium: 8.4 mg/dL — ABNORMAL LOW (ref 8.9–10.3)
Chloride: 97 mmol/L — ABNORMAL LOW (ref 98–111)
Creatinine, Ser: 0.53 mg/dL (ref 0.44–1.00)
GFR, Estimated: >60 mL/min (ref >60)
Glucose, Bld: 150 mg/dL — ABNORMAL HIGH (ref 70–99)
Potassium: 4.4 mmol/L (ref 3.5–5.1)
Sodium: 132 mmol/L — ABNORMAL LOW (ref 135–145)

## 2023-11-01 LAB — CBC
HCT: 33.2 % — ABNORMAL LOW (ref 36.0–46.0)
Hemoglobin: 10.6 g/dL — ABNORMAL LOW (ref 12.0–15.0)
MCH: 29.7 pg (ref 26.0–34.0)
MCHC: 31.9 g/dL (ref 30.0–36.0)
MCV: 93 fL (ref 80.0–100.0)
Platelets: 268 10*3/uL (ref 150–400)
RBC: 3.57 MIL/uL — ABNORMAL LOW (ref 3.87–5.11)
RDW: 16.7 % — ABNORMAL HIGH (ref 11.5–15.5)
WBC: 11.7 10*3/uL — ABNORMAL HIGH (ref 4.0–10.5)
nRBC: 0 % (ref 0.0–0.2)

## 2023-11-01 LAB — MAGNESIUM: Magnesium: 2 mg/dL (ref 1.7–2.4)

## 2024-02-11 DEATH — deceased
